# Patient Record
Sex: Male | Born: 1945 | Race: Black or African American | Hispanic: No | Marital: Married | Smoking: Never smoker
Health system: Southern US, Community
[De-identification: ages and names within clinical notes are randomized; demographics above are authoritative.]

## PROBLEM LIST (undated history)

## (undated) DIAGNOSIS — I4891 Unspecified atrial fibrillation: Secondary | ICD-10-CM

## (undated) DIAGNOSIS — S81802A Unspecified open wound, left lower leg, initial encounter: Secondary | ICD-10-CM

## (undated) DIAGNOSIS — G629 Polyneuropathy, unspecified: Secondary | ICD-10-CM

## (undated) DIAGNOSIS — D649 Anemia, unspecified: Secondary | ICD-10-CM

## (undated) DIAGNOSIS — E785 Hyperlipidemia, unspecified: Secondary | ICD-10-CM

## (undated) DIAGNOSIS — I10 Essential (primary) hypertension: Secondary | ICD-10-CM

## (undated) DIAGNOSIS — I509 Heart failure, unspecified: Secondary | ICD-10-CM

## (undated) DIAGNOSIS — I499 Cardiac arrhythmia, unspecified: Secondary | ICD-10-CM

## (undated) DIAGNOSIS — N189 Chronic kidney disease, unspecified: Secondary | ICD-10-CM

## (undated) HISTORY — PX: OTHER SURGICAL HISTORY: SHX169

## (undated) HISTORY — DX: Unspecified atrial fibrillation: I48.91

## (undated) HISTORY — PX: KIDNEY TRANSPLANT: SHX239

## (undated) HISTORY — DX: Polyneuropathy, unspecified: G62.9

## (undated) HISTORY — DX: Hyperlipidemia, unspecified: E78.5

## (undated) HISTORY — DX: Chronic kidney disease, unspecified: N18.9

## (undated) HISTORY — PX: ARTERIOVENOUS GRAFT PLACEMENT: SUR1029

---

## 1998-02-23 ENCOUNTER — Ambulatory Visit (HOSPITAL_COMMUNITY): Admission: RE | Admit: 1998-02-23 | Discharge: 1998-02-23 | Payer: Self-pay | Admitting: Vascular Surgery

## 1998-03-21 ENCOUNTER — Other Ambulatory Visit: Admission: RE | Admit: 1998-03-21 | Discharge: 1998-03-21 | Payer: Self-pay | Admitting: *Deleted

## 1998-04-03 ENCOUNTER — Ambulatory Visit (HOSPITAL_COMMUNITY): Admission: RE | Admit: 1998-04-03 | Discharge: 1998-04-03 | Payer: Self-pay | Admitting: Vascular Surgery

## 1998-04-15 ENCOUNTER — Ambulatory Visit (HOSPITAL_COMMUNITY): Admission: RE | Admit: 1998-04-15 | Discharge: 1998-04-15 | Payer: Self-pay | Admitting: Plastic Surgery

## 1998-05-30 ENCOUNTER — Ambulatory Visit (HOSPITAL_COMMUNITY): Admission: RE | Admit: 1998-05-30 | Discharge: 1998-05-30 | Payer: Self-pay | Admitting: Nephrology

## 1998-07-07 ENCOUNTER — Ambulatory Visit (HOSPITAL_COMMUNITY): Admission: RE | Admit: 1998-07-07 | Discharge: 1998-07-07 | Payer: Self-pay

## 1998-07-07 ENCOUNTER — Encounter: Payer: Self-pay | Admitting: Vascular Surgery

## 1999-02-26 ENCOUNTER — Ambulatory Visit (HOSPITAL_COMMUNITY): Admission: RE | Admit: 1999-02-26 | Discharge: 1999-02-26 | Payer: Self-pay | Admitting: Vascular Surgery

## 1999-10-08 ENCOUNTER — Ambulatory Visit (HOSPITAL_COMMUNITY): Admission: RE | Admit: 1999-10-08 | Discharge: 1999-10-08 | Payer: Self-pay | Admitting: Gastroenterology

## 2000-02-16 ENCOUNTER — Encounter: Payer: Self-pay | Admitting: Nephrology

## 2000-02-16 ENCOUNTER — Ambulatory Visit (HOSPITAL_COMMUNITY): Admission: RE | Admit: 2000-02-16 | Discharge: 2000-02-16 | Payer: Self-pay | Admitting: Nephrology

## 2000-06-09 ENCOUNTER — Encounter: Payer: Self-pay | Admitting: *Deleted

## 2000-06-09 ENCOUNTER — Ambulatory Visit (HOSPITAL_COMMUNITY): Admission: RE | Admit: 2000-06-09 | Discharge: 2000-06-09 | Payer: Self-pay | Admitting: *Deleted

## 2002-01-16 ENCOUNTER — Encounter: Payer: Self-pay | Admitting: Nephrology

## 2002-01-16 ENCOUNTER — Ambulatory Visit (HOSPITAL_COMMUNITY): Admission: RE | Admit: 2002-01-16 | Discharge: 2002-01-16 | Payer: Self-pay | Admitting: Nephrology

## 2002-09-10 ENCOUNTER — Inpatient Hospital Stay (HOSPITAL_COMMUNITY): Admission: AD | Admit: 2002-09-10 | Discharge: 2002-09-11 | Payer: Self-pay | Admitting: Nephrology

## 2002-09-10 ENCOUNTER — Encounter: Payer: Self-pay | Admitting: Nephrology

## 2003-01-07 ENCOUNTER — Encounter: Admission: RE | Admit: 2003-01-07 | Discharge: 2003-01-07 | Payer: Self-pay | Admitting: Nephrology

## 2003-01-07 ENCOUNTER — Encounter: Payer: Self-pay | Admitting: Nephrology

## 2003-11-26 ENCOUNTER — Ambulatory Visit (HOSPITAL_COMMUNITY): Admission: RE | Admit: 2003-11-26 | Discharge: 2003-11-26 | Payer: Self-pay | Admitting: Nephrology

## 2004-11-04 ENCOUNTER — Encounter: Admission: RE | Admit: 2004-11-04 | Discharge: 2005-02-02 | Payer: Self-pay | Admitting: Surgery

## 2005-08-19 ENCOUNTER — Encounter: Admission: RE | Admit: 2005-08-19 | Discharge: 2005-08-19 | Payer: Self-pay | Admitting: Nephrology

## 2005-08-25 ENCOUNTER — Encounter: Admission: RE | Admit: 2005-08-25 | Discharge: 2005-08-25 | Payer: Self-pay | Admitting: Nephrology

## 2007-07-28 ENCOUNTER — Encounter: Admission: RE | Admit: 2007-07-28 | Discharge: 2007-07-28 | Payer: Self-pay | Admitting: Nephrology

## 2009-08-15 ENCOUNTER — Emergency Department (HOSPITAL_COMMUNITY): Admission: EM | Admit: 2009-08-15 | Discharge: 2009-08-15 | Payer: Self-pay | Admitting: Emergency Medicine

## 2010-04-13 ENCOUNTER — Ambulatory Visit (HOSPITAL_COMMUNITY)
Admission: RE | Admit: 2010-04-13 | Discharge: 2010-04-13 | Payer: Self-pay | Source: Home / Self Care | Admitting: Nephrology

## 2010-09-29 ENCOUNTER — Encounter (HOSPITAL_COMMUNITY)
Admission: RE | Admit: 2010-09-29 | Discharge: 2010-10-20 | Payer: Self-pay | Source: Home / Self Care | Attending: Nephrology | Admitting: Nephrology

## 2011-02-05 NOTE — H&P (Signed)
[name], GRAVIER NO.:  [id number]   MEDICAL RECORD NO.:  [id number]                   PATIENT TYPE:  INP   LOCATION:  5504                                 FACILITY:  [facility]   PHYSICIAN:  Windy Kalata, M.D.          DATE OF BIRTH:  02-06-1946   DATE OF ADMISSION:  09/10/2002  DATE OF DISCHARGE:                                HISTORY & PHYSICAL   CHIEF COMPLAINT:  Fever and chills.   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  Yesterday, the patient  states he had two hypoglycemic episodes with seizure by report.  EMS came  to the home and the patient was given IV dextrose, and the patient felt that  he did not need to come to the hospital on either occasion.  The patient was  also febrile yesterday.  Today, he had another hypoglycemic episode pre-  dialysis, he did not take his Glucotrol, but had eating poorly with  associated seizure-like activity according to the nurse.  EMS was called  because the R.N.'s were concerned they could not get a line started in him.  However, they were able, and he was given 25 g of D50 per protocol.  Blood  sugar increased from the 50's into the 60's, and he was also given oral  glucose tablets and later given the rest of the 25 g of D50.   On admission, the patient has fevers, rigors, temperature increase from  102.8 to 103.3.  His blood pressure was elevated with a goal of approximate  4 L his goal was increased an additional 500 cc because he had been below  his dry weight.  Blood cultures x2 were drawn, and Ancef and tobramycin were  given empirically, and the patient will be admitted to [facility]  after  hemodialysis for further evaluation and treatment.  To be noted, is the  patient did receive the flu vaccine in the fall.   REVIEW OF SYMPTOMS:  Slight cough, no chest pain, no shortness of breath.  Positive urgency, but no urine output.  Positive anorexia, no nausea, no  vomiting, no diarrhea, no constipation, positive fever and rigors.  No  headache.  Prior sore throat and upper respiratory symptoms that moved from  his head into his chest, prior fatigue and sleepiness.   PAST MEDICAL HISTORY:  1. Subacute fifth left metatarsal fracture.  2. History of atrial flutter that was asymptomatic.  3. Early aseptic necrosis, right femoral head, Dr. Telford Nab.  4. Liver biopsy showed Statio hepatitis, this was part of his transplant     workup, in 12/98.  5.  History of gastrointestinal bleed.  6. History of internal and external hemorrhoids.  7. Diverticulosis.  8. Multiple AVM's in 10/97, with adenomatous polyp.  Followup colonoscopy in     1/01, was negative.  9. 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  10.      Sensory motor peripheral polyneuropathy in 8/96.  11.      Status post parathyroidectomy in 1/96.  12.      Multiple vascular access problems.   FAMILY HISTORY:  Noncontributory.   SOCIAL HISTORY:  Lives with wife.  [employer] part-time.   MEDICATIONS:  1. Nephro-Vite one tablet q.d.  2. Renagel four tablets a.c. with one with snacks.  3. P.r.n. Indocin for gout.  4. Glucotrol XL 10 mg q.a.m. 5 mg q.p.m.  5. Pepcid 20 mg q.d.  6. Coumadin 3 mg q.d. for graft patency.   PHYSICAL EXAMINATION AT HEMODIALYSIS TODAY:  VITAL SIGNS:  Temperature  103.3, blood pressure 183/74 after 3 L of fluid removed, pulse 110.  GENERAL:  An acutely-ill male, huddling under blankets with rigors.  HEENT:  Normocephalic, atraumatic.  Tongue coated.  Pupils equal, round,  reactive to light.  HEART:  Tachycardic.  LUNGS:  Decreased breath sounds, poor examination  quality.  ABDOMEN:  Positive bowel sounds, soft, liver down somewhat, difficult to  tell because of the way he is sitting in the chair.  Nontender,  nondistended.  EXTREMITIES:  No lower extremity edema, no skin breakdown.  Right upper AV  Gore-Tex graft is patent.  NEUROLOGIC:  Alert and oriented x3.  Moves all extremities well.   LABORATORY DATA:  Blood cultures x2 were drawn in hemodialysis.   ASSESSMENT AND PLAN:  1. Febrile illness.  Blood cultures x2 were drawn.  Empirically treated with     2 g of Ancef and 130 mg of tobramycin.  Will give Tequin at the hospital.     Check antibiotics for insulins or ANB.  Check CBC with diff, check urine     culture and chest x-ray.  2. Hypoglycemia with associated seizures.  Hold Glucotrol for now.  Give D10     slowly IV until blood sugars are greater then 150 consistently.  Resume     Glucotrol when sugars are increased and the patient is eating well.     Check Accu-Checks a.c. and h.s., and if sugars become ___________, we     will implement sliding scale insulin.  3. End-stage renal disease.  Monday, Wednesday, and Friday.  He is status     post hemodialysis today.  We will check fluid status on chest x-ray.  The     patient probably needs dry weight lowered due to hypertension.  4. Hypertension.  Continue volume removal, extra 500 cc were removed today.     We will weigh the patient on admission.  5. Status post parathyroidectomy in 1996.  Bio-PTH last October was 141, up     to 204 this month.  We will increase Calcitriol to 2 mcg.  6. Anemia management.  Hemoglobin was 12 on his monthly labs.  Continue low-     dose Epogen.  T-STAT was 32%.  7. History of elevated liver function tests.  His AST this month was 57.     This is a chronic problem.  The patient has a history of Statio     hepatitis.     Alric Seton, P.A.  Windy Kalata, M.D.   MB/MEDQ  D:  09/10/2002  T:  09/10/2002  Job:  [id number]

## 2011-02-05 NOTE — Op Note (Signed)
[place]  Patient:    [name], [name]                      MRN: [id number] Proc. Date: 06/09/00 Adm. Date:  [id number] Disc. Date: [id number] Attending:  Lavonna Monarch                           Operative Report  PREOPERATIVE DIAGNOSES: 1. End-stage renal failure. 2. Clotted right upper arm arteriovenous graft.  POSTOPERATIVE DIAGNOSES: 1. End-stage renal failure. 2. Clotted right upper arm arteriovenous graft.  PROCEDURE:  Thrombectomy and revision right upper arm arteriovenous graft.  SURGEON:  Gordy Clement, M.D.  ASSISTANT:  Ollen Bowl, P.A.  ANESTHESIA:  Local with MAC.  ANESTHESIOLOGIST:  Finis Bud, M.D.  OPERATIVE PROCEDURE:  The patient was brought to the operating room in stable condition.  Placed in a supine position.  Right arm prepped and draped in a sterile fashion.  The skin and subcutaneous tissue instilled with 1% Xylocaine with epinephrine. Longitudinal skin incision made through the right axilla.  Dissection carried down to expose the venous anastomosis.  There was a large, draining axillary vein.  This was mobilized proximally, controlled with a Gregory clamp.  The venous anastomosis taken down, a short segment of graft excised.  The graft thrombectomized with a 4 Fogarty catheter.  Excellent inflow obtained.  The graft was filled with heparin saline solution and controlled with a fistula clamp.  A new segment of 6 mm Gore-Tex was then anastomosed end-to-end to the divided graft using running 6-0 Prolene suture.  This was then anastomosed end-to-end to the vein using running 6-0 Prolene suture.  Clamps were then removed. Excellent flow present.  Good hemostasis obtained.  The sponge and instruments counts were correct.  Subcutaneous tissue closed with a running 3-0 Vicryl suture.  The skin closed with 4-0 Monocryl.  Half-inch Steri-Strips applied.  The patient transferred to the recovery room in  stable condition. DD:  06/09/00 TD:  06/10/00 Job: [id number]

## 2011-02-05 NOTE — Procedures (Signed)
[place]  Patient:    [name]                       MRN: [id number] Proc. Date: 10/08/99 Adm. Date:  [id number] Attending:  Sherrin Daisy CC:         Duane Lope. Mcarthur Rossetti, M.D.                           Procedure Report  PROCEDURE PERFORMED:  Colonoscopy.  ENDOSCOPIST:  Joyice Faster. Oletta Lamas, M.D.  MEDICATIONS USED:  Fentanyl 100 mcg, Versed 5 mg IV.  INSTRUMENT:  Adult Olympus video colonoscope.  INDICATIONS:  Previous history of adenomatous colon polyps removed in 1997 from a gentleman who is a renal transplant patient.  DESCRIPTION OF PROCEDURE:  The procedure had been explained to the patient and consent obtained.  With the patient in the left lateral decubitus position, the  video colonoscope was inserted and advanced under direct visualization.  The prep was somewhat marginal and there was sticky, adherent stool.  We were able to advance to the cecum without difficulty.  The scope was withdrawn.  The cecum, ascending colon, hepatic flexure, transverse colon, splenic flexure, descending and sigmoid colon were seen well.  Scattered diverticula in the sigmoid colon.  No polyps seen throughout.  Rectum carefully examined.  Large internal hemorrhoids. No other abnormalities.  Scope withdrawn, patient tolerated the procedure well.  Maintained on low flow oxygen and pulse oximeter throughout the procedure with o obvious problem.  ASSESSMENT: 1. Large internal hemorrhoids. 2. No evidence of further adenomatous colon polyps.  PLAN:  Will recommend repeating in three years. DD:  10/08/99 TD:  10/09/99 Job: [id number]

## 2011-02-05 NOTE — Discharge Summary (Signed)
NAMEBILLYE, HOPPA                         ACCOUNT NO.:  [id number]   MEDICAL RECORD NO.:  [id number]                   PATIENT TYPE:  INP   LOCATION:  5523                                 FACILITY:  [facility]   PHYSICIAN:  Windy Kalata, M.D.          DATE OF BIRTH:  [birth year]   DATE OF ADMISSION:  09/10/2002  DATE OF DISCHARGE:  09/11/2002                                 DISCHARGE SUMMARY   ADMISSION DIAGNOSIS:  Febrile illness.   DISCHARGE DIAGNOSIS:  Febrile illness.   SERVICE:  Renal service under Dr. Fleet Contras.   CONSULTATIONS:  None.   PROCEDURE:  None.   HISTORY AND PHYSICAL:  The patient is a 65 year old male with end-stage  renal disease secondary to hypertension with a prior history of renal  transplant rejection and prior failure peritoneal dialysis secondary to  peritonitis had been in his usual state of relatively good health until he  started having cold symptoms about 7-10 days before recent admission.  The  day before his admission the patient stated he had two hypoglycemic episodes  with a seizure by report.  EMS came to the home and the patient was given  IV dextrose, and the patient felt that he did not need to come to the  hospital on either occasion.  The patient was also febrile the day before  his admission.  The day of his admission he had another hypoglycemic  episode, predialysis, and he did not take his Glucotrol, but had eaten  poorly with associated seizure-like activity according to the nurse.  EMS  was called because the RNs were concerned that they could not get a line  started in him.  However, they were able and was given 25 grams of D-50 per  protocol.  Blood sugar increased from 50s into 60s and he was also given  oral glucose tablets and later given the rest of the 25 grams of D-50.  On  admission, the patient has fevers, rigors.  Temperature increased from 102.8  to 103.3, blood pressure was elevated with a goal of  approximately 4 liters.  His goal was increased an additional 500 cc because he had been below his  dry weight.  Blood cultures x2 were done and Ancef and Tobramycin were given  empirically and the patient was admitted to [facility] after hemodialysis for  further evaluation and treatment.  To be noted is the patient did not  receive the flu vaccine in the Fall.   LABORATORY DATA:  On the day of his admission, his sodium was 145, potassium  3.7, creatinine of 8.4, AST 289, ALT 62, bilirubin total 1.4.  White count  of 5.6, hemoglobin of 11.8.  Influenza A and B negative.   HOSPITAL COURSE:  The patient did well.  He was able to leave the next day  on the 23rd.  I do not believe he was ever dialyzed.  Blood cultures were  drawn at the ______ [place] were continued here at the  hospital.  The Glucotrol was cut in half from the usual dose of 10 mg in the  morning, 5 mg in the evening.  Blood pressure was controlled well.  Chest x-  ray after admission showed no definite pneumonia, loss of volume in right  lower lung field.  His T-max the night of December 22nd got up as high as  100.5, therefore it was felt important to continue Tequin.  For five more  days after discharge will continue Tequin.   CONDITION ON DISCHARGE:  Stable.   DISPOSITION:  Discharged to home.    DISCHARGE MEDICATIONS:  1. Tequin 200 mg p.o. q.d. x6 days.  2. Nephro-Vite one tablet p.o. q.h.s.  3. Renagel.  4. Indocin can be continued.  5. Coumadin 3 mg p.o. q.d.  6. Glucotrol XL will be stopped.  He understands to only restart Glucotrol     XL 5 mg one tablet once a day if blood sugars are greater than or equal     to 200.   FOLLOW UP:  We will check PT/INR next dialysis treatment.  He will resume  dialysis on the 24th on Wednesday at normal dialysis time.  That will be  tomorrow on the 24th.     Manus Gunning, P.A.-C                  Windy Kalata, M.D.    MG/MEDQ  D:   09/14/2002  T:  09/15/2002  Job:  [id number]

## 2011-06-30 ENCOUNTER — Inpatient Hospital Stay (HOSPITAL_COMMUNITY)
Admission: EM | Admit: 2011-06-30 | Discharge: 2011-07-02 | DRG: 253 | Disposition: A | Discharge status: Home / Self Care | Payer: Medicare Other | Attending: Vascular Surgery | Admitting: Vascular Surgery

## 2011-06-30 ENCOUNTER — Emergency Department (HOSPITAL_COMMUNITY)
Admission: EM | Admit: 2011-06-30 | Discharge: 2011-06-30 | Disposition: A | Discharge status: Home / Self Care | Payer: Medicare Other | Source: Home / Self Care | Attending: Emergency Medicine | Admitting: Emergency Medicine

## 2011-06-30 ENCOUNTER — Observation Stay (HOSPITAL_COMMUNITY): Payer: Medicare Other

## 2011-06-30 DIAGNOSIS — I739 Peripheral vascular disease, unspecified: Secondary | ICD-10-CM | POA: Diagnosis present

## 2011-06-30 DIAGNOSIS — T82898A Other specified complication of vascular prosthetic devices, implants and grafts, initial encounter: Secondary | ICD-10-CM

## 2011-06-30 DIAGNOSIS — I4891 Unspecified atrial fibrillation: Secondary | ICD-10-CM | POA: Diagnosis present

## 2011-06-30 DIAGNOSIS — Z940 Kidney transplant status: Secondary | ICD-10-CM

## 2011-06-30 DIAGNOSIS — I742 Embolism and thrombosis of arteries of the upper extremities: Secondary | ICD-10-CM

## 2011-06-30 DIAGNOSIS — E119 Type 2 diabetes mellitus without complications: Secondary | ICD-10-CM | POA: Diagnosis present

## 2011-06-30 DIAGNOSIS — Z79899 Other long term (current) drug therapy: Secondary | ICD-10-CM

## 2011-06-30 HISTORY — PX: LIGATION GORETEX GRAFT: SHX5154

## 2011-06-30 LAB — CBC
HCT: 34.2 % — ABNORMAL LOW (ref 39.0–52.0)
Hemoglobin: 11.2 g/dL — ABNORMAL LOW (ref 13.0–17.0)
MCH: 31.2 pg (ref 26.0–34.0)
MCV: 95.3 fL (ref 78.0–100.0)
Platelets: 117 10*3/uL — ABNORMAL LOW (ref 150–400)
RBC: 3.59 MIL/uL — ABNORMAL LOW (ref 4.22–5.81)
RDW: 12.4 % (ref 11.5–15.5)
WBC: 8.9 10*3/uL (ref 4.0–10.5)

## 2011-06-30 LAB — DIFFERENTIAL
Basophils Absolute: 0 10*3/uL (ref 0.0–0.1)
Basophils Relative: 0 % (ref 0–1)
Eosinophils Absolute: 0 10*3/uL (ref 0.0–0.7)
Eosinophils Relative: 0 % (ref 0–5)
Lymphocytes Relative: 6 % — ABNORMAL LOW (ref 12–46)
Lymphs Abs: 0.5 10*3/uL — ABNORMAL LOW (ref 0.7–4.0)
Monocytes Absolute: 0.4 10*3/uL (ref 0.1–1.0)
Monocytes Relative: 5 % (ref 3–12)
Neutro Abs: 8 10*3/uL — ABNORMAL HIGH (ref 1.7–7.7)
Neutrophils Relative %: 89 % — ABNORMAL HIGH (ref 43–77)

## 2011-06-30 LAB — BASIC METABOLIC PANEL
Calcium: 9.9 mg/dL (ref 8.4–10.5)
GFR calc Af Amer: 58 mL/min — ABNORMAL LOW (ref >90)
GFR calc non Af Amer: 50 mL/min — ABNORMAL LOW (ref >90)
Glucose, Bld: 332 mg/dL — ABNORMAL HIGH (ref 70–99)
Potassium: 4.1 mEq/L (ref 3.5–5.1)
Sodium: 133 mEq/L — ABNORMAL LOW (ref 135–145)

## 2011-06-30 LAB — GLUCOSE, CAPILLARY: Glucose-Capillary: 146 mg/dL — ABNORMAL HIGH (ref 70–99)

## 2011-07-01 LAB — BASIC METABOLIC PANEL
BUN: 16 mg/dL (ref 6–23)
Chloride: 102 mEq/L (ref 96–112)
Creatinine, Ser: 1.24 mg/dL (ref 0.50–1.35)
Glucose, Bld: 185 mg/dL — ABNORMAL HIGH (ref 70–99)
Potassium: 4.3 mEq/L (ref 3.5–5.1)

## 2011-07-01 LAB — HEMOGLOBIN A1C
Hgb A1c MFr Bld: 8.8 % — ABNORMAL HIGH (ref <5.7)
Mean Plasma Glucose: 206 mg/dL — ABNORMAL HIGH (ref <117)

## 2011-07-01 LAB — CBC
HCT: 34.9 % — ABNORMAL LOW (ref 39.0–52.0)
Hemoglobin: 11.3 g/dL — ABNORMAL LOW (ref 13.0–17.0)
MCV: 94.6 fL (ref 78.0–100.0)
RDW: 12.5 % (ref 11.5–15.5)
WBC: 10 10*3/uL (ref 4.0–10.5)

## 2011-07-01 LAB — GLUCOSE, CAPILLARY
Glucose-Capillary: 301 mg/dL — ABNORMAL HIGH (ref 70–99)
Glucose-Capillary: 213 mg/dL — ABNORMAL HIGH (ref 70–99)

## 2011-07-01 LAB — HEPARIN LEVEL (UNFRACTIONATED): Heparin Unfractionated: <0.1 IU/mL — ABNORMAL LOW (ref 0.30–0.70)

## 2011-07-02 LAB — CBC
HCT: 31.9 % — ABNORMAL LOW (ref 39.0–52.0)
Hemoglobin: 10.5 g/dL — ABNORMAL LOW (ref 13.0–17.0)
MCH: 30.9 pg (ref 26.0–34.0)
MCHC: 32.9 g/dL (ref 30.0–36.0)
MCV: 93.8 fL (ref 78.0–100.0)
RDW: 12.3 % (ref 11.5–15.5)

## 2011-07-02 LAB — GLUCOSE, CAPILLARY: Glucose-Capillary: 287 mg/dL — ABNORMAL HIGH (ref 70–99)

## 2011-07-06 NOTE — Discharge Summary (Addendum)
NAMEMONTEZ, NEUDECKER NO.:  [id number]  MEDICAL RECORD NO.:  [id number]  LOCATION:  2023                         FACILITY:  [facility]  PHYSICIAN:  Nelda Severe. Kellie Simmering, M.D.  DATE OF BIRTH:  1946/04/05  DATE OF ADMISSION:  06/30/2011 DATE OF DISCHARGE:  07/02/2011                              DISCHARGE SUMMARY   CHIEF COMPLAINT:  Pain in the right hand.  HISTORY OF PRESENT ILLNESS:  [name] is a 65 year old gentleman with a history of chronic kidney disease, and right upper arm functioning AV graft, who is not presently on dialysis, as he has a functioning kidney transplant which he received in 2005.  On June 30, 2011, he was taking a lawnmower at the back of his car when he noticed the onset of acute right hand pain.  He came to the emergency department but they were unable to Doppler pulses in his hand.  He had not had any previous issues with the right upper extremity.  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  The patient was seen by Dr. Kellie Simmering, and it was felt that he should have an emergent exploration of brachial artery with thromboembolectomy of the radial and ulnar arteries.  PAST MEDICAL HISTORY: 1. Neuropathy. 2. Atrial fibrillation, not on Coumadin. 3. Hyperlipidemia. 4. Diabetes type 2. 5. Chronic kidney disease, status post transplant in 2005. 6. Achilles tendon repair on the left. 7. Bowel repair status post transplant procedure.  HOSPITAL COURSE:  The patient was taken emergently to the operating room for ligation of his arterial graft in the right upper arm.  He also had exploration of the brachial artery with selective thromboembolectomy of radial and ulnar arteries with very small amount of debris removed.  He was noted to have severe calcific vessels with intraoperative arteriogram.  Postoperatively was found to have a good pulse in the brachial artery with  obstructive sounding of the Doppler flow.  There is no audible flow in the radial and ulnar arteries on that side.  However, there is no other option to proceed, so patient was closed and was started on IV heparin.  He had did well over the next couple of days with improvement in his pain status he also had a good grip in the right hand.  The right hand was cyanotic and cool, but the patient stated that the right hand overall felt much better.  He did have Doppler signal only in the ulnar artery which was monophasic at best he had a had biphasic on the brachial signal in the right arm but no palmar arch signal.  It was felt that at this point, the patient's condition was stable as his symptoms had improved.  There is no other further surgical options because of the calcific nature of his radial and ulnar arteries if things were to worsen.  The patient realizes that he may lose his hand and part of his arm.  His heparin was discontinued.  He was started on aspirin and he was discharged to home to be followed by Dr. Kellie Simmering in 3 weeks.  FINAL DIAGNOSES: 1. 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  He is status     post ligation of functioning AV graft in the right upper extremity     and exploration and thromboembolectomy of the radial and ulnar     arteries of the right arm. 2. His other medical conditions were stable while he was in the     hospital.  His CBC is white count was 6.3 hemoglobin hematocrit     were 10.5 and 31.9.  BMET, his potassium is 4.3, and his creatinine     is 1.24.  DISPOSITION:  Patient discharged home.  He will follow up with Dr. Kellie Simmering in 3 weeks.  DISCHARGE MEDICATIONS: 1. Oxycodone 5 mg 1-2 tabs every 4 hours as needed for pain. 2. Benadryl 25 mg 4 at bedtime as needed for sleep and allergies. 3. Calcitriol 0.25 mcg every Monday Wednesday, Friday. 4. CellCept 500 mg twice  daily. 5. Furosemide 40 mg every other day. 6. Gabapentin 200 mg daily at bedtime. 7. Glipizide 10 mg twice daily before meals. 8. Lovaza 4 capsules every morning, 1 mg. 9. Prednisone 5 mg daily. 10.Prograf 1 mg twice daily. 11.Trajenta 5 mg daily.     Michael Kearns, PA-C   ______________________________ Nelda Severe Kellie Simmering, M.D.    RR/MEDQ  D:  07/02/2011  T:  07/02/2011  Job:  [id number]  Electronically Signed by Michael Kearns PA on 07/06/2011 09:46:09 AM Electronically Signed by Tinnie Gens M.D. on 07/12/2011 10:07:55 AM

## 2011-07-12 NOTE — Consult Note (Signed)
NAMEDEVLIN, [name] NO.:  [id number]  MEDICAL RECORD NO.:  [id number]  LOCATION:  WLED                         FACILITY:  [name]  PHYSICIAN:  Nelda Severe. Kellie Simmering, M.D.  DATE OF BIRTH:  11/27/1945  DATE OF CONSULTATION: DATE OF DISCHARGE:  06/30/2011                                CONSULTATION   CONSULTING PHYSICIAN:  Nelda Severe. Kellie Simmering, M.D.  HISTORY OF PRESENT ILLNESS:  The patient presented today in the Emergency Room at [facility] with complaints of right hand pain.  He states that this morning he bought lawnmower and was trying to remove it from his trunk when he noticed acute onset of right hand pain.  He states he thought he sprained it and therefore he presented to the emergency room. On evaluation in the ER at [facility], they were unable to Doppler pulses in his hand, therefore vascular surgeon was consulted.  The patient was transferred to [facility] ER where he is currently being evaluated.  The patient denies any episodes of hand pain prior to this. His past medical history is significant for chronic kidney disease and has had multiple access procedures performed in the past.  He has an old nonfunctioning right forearm graft and a functioning right upper arm graft that has two pseudoaneurysms noted.  The patient states that he had a kidney transplant back in 2005 and has not been on dialysis since that time.  His nephrologist is Dr. Marval Regal and he was recently evaluated and was found to be doing quite well. The patient states that his hand has progressively increased in pain and he has noticed some numbness in his fingers.  He denies chest pain, shortness of breath, nausea, vomiting, diarrhea, constipation, TIA or CVA symptoms, and claudication symptoms.  Other than right hand pain, the patient is without complaint.  PAST MEDICAL HISTORY: 1. Neuropathy. 2. AFib, not on Coumadin. 3. Hyperlipidemia. 4. Diabetes mellitus type 2. 5. Chronic kidney disease,  status post transplant in 2005, status post     history of multiple access procedures in the right arm and a failed     left forearm AV fistula in the past. 6. Achilles tendon repair on the left. 7. Bowel repair, status post transplant procedure.  PAST SURGICAL HISTORY: 1. Kidney transplant in 2005. 2. Multiple access procedures in bilateral arms. 3. Achilles tendon repair on the left. 4. Bowel repair.  ALLERGIES:  ELAVIL, patient states it makes him "crazy".  MEDICATIONS: 1. Lovaza daily. 2. Prograf 1 mg b.i.d. 3. Prednisone 5 mg daily. 4. CellCept 500 mg b.i.d. 5. Tradjenta 5 mg daily. 6. Glipizide 10 mg b.i.d. 7. Gabapentin p.r.n. per patient report. 8. Calcium OTC 3 times per week.  SOCIAL HISTORY:  The patient denies tobacco and alcohol use.  He also denies illicit drug use.  REVIEW OF SYSTEMS:  Complete review of systems is negative except as stated in the HPI.  PHYSICAL EXAMINATION:  VITAL SIGNS:  Temp 98.3, BP 167/95, O2 sat 96% on room air. GENERAL:  He is in no acute distress and resting comfortably on the bed. HEENT:  Normocephalic, atraumatic.  PERRLA. EOMI. CARDIAC:  Irregular.  No murmurs noted. LUNGS:  Clear to  auscultation. ABDOMEN:  Soft, nontender with active bowel sounds. MUSCULOSKELETAL:  There is a 2+ palpable radial pulse on the left, and hand is warm and pink.  There are no palpable pulses in the right radial or ulnar side.  There is a palpable brachial pulse on the right and a palpable graft thrill in the right upper arm graft.  The right hand has some pallor and bluish discoloration of the nail beds.  Cap refill is slow.  There is no evidence of wounds.  There is an ulnar signal with a Doppler on the right, which is diminished.  There is no radial Doppler signal on the right. NEURO:  Intact.  Motor and sensation are intact in all 4 extremities. SKIN:  No evidence of rashes or skin breakdown are noted.  LABS:  CBC and BMP performed on June 30, 2011, white count 8.9, hemoglobin 11.2, hematocrit 34.2, platelets 117.  Sodium 133, potassium 4.1, BUN 22, creatinine 1.44.  ASSESSMENT:  Acute ischemia of the right hand.  Possible thrombus.  PLAN:  The patient will be taken urgently to the operating room for ligation of his right upper arm graft and exploration of the brachial artery and possible thrombectomy.     Leta Baptist, PA   ______________________________ Nelda Severe Kellie Simmering, M.D.    AY/MEDQ  D:  06/30/2011  T:  06/30/2011  Job:  [id number]  Electronically Signed by Leta Baptist PA on 07/09/2011 09:59:57 AM Electronically Signed by Tinnie Gens M.D. on 07/12/2011 10:08:00 AM

## 2011-07-12 NOTE — Op Note (Signed)
NAMETRAE, HORWITZ NO.:  [id number]  MEDICAL RECORD NO.:  [id number]  LOCATION:  [id number]                         FACILITY:  [facility]  PHYSICIAN:  Nelda Severe. Kellie Simmering, M.D.  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  OPERATIONS: 1. Ligation, arteriovenous graft, right upper arm. 2. Exploration of brachial artery with selective thromboembolectomy of     radial and ulnar arteries with very small amount of debris removed,     severe calcific vessels with intraoperative arteriogram.  SURGEON:  Nelda Severe. Kellie Simmering, MD  FIRST ASSISTANT:  Evorn Gong, [place]  ANESTHESIA:  General endotracheal.  PROCEDURE:  The patient was taken the operating room and placed in a supine position at which time satisfactory general endotracheal anesthesia was administered.  The right upper extremity was prepped with Betadine scrub and solution, draped in a routine sterile manner.  The hand was prepped in a sterile intestinal bag so that we could observe the color of the fingers.  The functioning AV graft in the left upper arm was originating from the brachial artery in the distal upper arm.  A short longitudinal incision was made in this area.  There was a large pseudoaneurysm about 4 cm from the arterial anastomosis which was about 4 x 4 cm in size ad second pseudoaneurysm more proximally in the graft. Graft was dissected free.  Brachial, proximal, and distal control of the brachial artery was obtained.  4000 units of heparin was given intravenously.  Graft was transected leaving about a 1-cm cuff on the brachial artery and the proximal end of the  graft ligated with #1 silk tie.  Following this, there was excellent inflow noted.  The #3 and #4 Fogarty catheter was passed distally to see if any debris would be located in the radial and ulnar arteries.  The catheters would not go more than 25-30 mm to about the mid to distal forearm and there was severe calcific disease in the vessels.  Excellent backbleeding was present.  After multiple passes, the cuff of graft was oversewn with 2 continuous 6-0 Prolene suture.  There was abnormal arterial flow at the brachial artery level.  There was no flow in the radial or ulnar level at the wrist.  Intraoperative arteriogram was performed injecting 30 mL of contrast into the brachial artery.  This revealed severe disease in both the radial and ulnar branches with occlusion and tapering at about the midforearm level.  Decided to explore each vessel selectively to be certain there was nothing further that could be done, therefore second incision was made in the proximal forearm where the brachial artery bifurcated.  There was an old Gore-Tex graft which was nonfunctional originating from brachial artery in this area.  The brachial artery was exposed down to the radial and ulnar branches which were encircled with vessel loops.  Transverse opening was made in the artery and there was  excellent inflow present.  The #3 Fogarty was passed selectively down the radial and ulnar arteries and as before Fogarty would not go past the distal forearm in the ulnar, in the wrist, in the radial and it was very severely calcified and diseased over the mid to distal half of the forearm and both vessels.  The arteriotomy was repaired with a continuous 6-0 Prolene suture. The clamp was released and there was a good pulse in the brachial artery with obstructive sounding of Doppler flow.  There was no flow audible on the radial or ulnar arteries as preoperatively.  Having no further options, the wounds were  irrigated with saline.  There was another incision made in the proximal upper arm to transect the graft in order to evacuate these large pseudoaneurysms and the proximal end of the graft was ligated with a #1 silk tie.  All wounds were closed in layers with Vicryl in a subcuticular fashion with Dermabond.  Sterile compression dressing was applied and the patient was taken to the recovery room in stable condition.     Nelda Severe Kellie Simmering, M.D.     JDL/MEDQ  D:  06/30/2011  T:  07/01/2011  Job:  [id number]  Electronically Signed by Tinnie Gens M.D. on 07/12/2011 10:07:58 AM

## 2011-07-14 ENCOUNTER — Encounter: Payer: Self-pay | Admitting: Vascular Surgery

## 2011-07-16 ENCOUNTER — Ambulatory Visit (INDEPENDENT_AMBULATORY_CARE_PROVIDER_SITE_OTHER): Payer: Medicare Other | Admitting: Vascular Surgery

## 2011-07-16 ENCOUNTER — Encounter: Payer: Self-pay | Admitting: Vascular Surgery

## 2011-07-16 VITALS — BP 159/77 | HR 86 | Resp 20 | Ht 68.0 in | Wt 166.9 lb

## 2011-07-16 DIAGNOSIS — L03019 Cellulitis of unspecified finger: Secondary | ICD-10-CM

## 2011-07-16 DIAGNOSIS — IMO0001 Reserved for inherently not codable concepts without codable children: Secondary | ICD-10-CM | POA: Insufficient documentation

## 2011-07-16 DIAGNOSIS — N186 End stage renal disease: Secondary | ICD-10-CM

## 2011-07-16 MED ORDER — SULFAMETHOXAZOLE-TRIMETHOPRIM 800-160 MG PO TABS: 1.0000 | ORAL_TABLET | Freq: Two times a day (BID) | ORAL | Status: DC

## 2011-07-16 MED ORDER — SULFAMETHOXAZOLE-TRIMETHOPRIM 800-160 MG PO TABS: 1.0000 | ORAL_TABLET | Freq: Two times a day (BID) | ORAL | Status: AC

## 2011-07-16 NOTE — Progress Notes (Signed)
[facility]  Postoperative Visit  History of Present Illness  [name] is a 65 y.o. year old male who presents for postoperative follow-up for: ligation of RUA AVG, TE of radial and ulnar arteries by Dr. Kellie Simmering  (Date: 06/30/11).  The patient's wounds are healed.  The patient notes improvement resolution of lower extremity symptoms.  The patient is able to complete their activities of daily living.  The patient's current symptoms are: pain in right 3rd finger.  Patient has h/o recurrent paronychia treated with abx.  The patient is a transplant patient on immunosuppression regimen.  Physical Examination  Filed Vitals:   07/16/11 1227  BP: 159/77  Pulse: 86  Resp: 20   RUE: Incisions are healed, 3rd finger has erythema surrounding the nail without frank purulence, TTP to this area, somewhat ischemic 2nd fingertip, with limited ischemic changes to 3rd and 4th fingertips  Medical Decision Making  [name] is a 65 y.o. year old male who presents s/p ligation of RUA AVG, TE of radial and ulnar arteries with likely early paronychia of right 3rd finger The right hand looks surprisingly well perfused given his named arteries in his right lower arm are occluded I gave the patient a 10 day course of Bactrim DS 1 PO BID to help treat his paronychia I discussed with the patient if his right hand worsened, he might need a I&D of his 3rd finger, which we was reluctant to proceed with with I discussed with him that he needs to contact his transplant team to get guidance and evaluation from them in the setting of new infection The patient has follow up with Dr. Kellie Simmering already scheduled, and should plan on following up with him at that time. I discussed in depth with the patient the nature of atherosclerosis, and emphasized the importance of maximal medical management including strict control of blood pressure, blood glucose, and lipid levels, obtaining regular  exercise, and cessation of smoking.  The patient is aware that without maximal medical management the underlying atherosclerotic disease process will progress, limiting the benefit of any interventions.  Thank you for allowing [place] to participate in this patient's care.  [name], MD [facility] Office: [phone] Pager: [phone]

## 2011-07-26 ENCOUNTER — Encounter: Payer: Self-pay | Admitting: Vascular Surgery

## 2011-07-27 ENCOUNTER — Encounter: Payer: Self-pay | Admitting: Vascular Surgery

## 2011-07-27 ENCOUNTER — Ambulatory Visit (INDEPENDENT_AMBULATORY_CARE_PROVIDER_SITE_OTHER): Payer: Medicare Other | Admitting: Vascular Surgery

## 2011-07-27 VITALS — BP 133/80 | HR 83 | Temp 98.4°F | Ht 68.0 in | Wt 166.0 lb

## 2011-07-27 DIAGNOSIS — N186 End stage renal disease: Secondary | ICD-10-CM

## 2011-07-27 DIAGNOSIS — I70209 Unspecified atherosclerosis of native arteries of extremities, unspecified extremity: Secondary | ICD-10-CM

## 2011-07-27 NOTE — Progress Notes (Signed)
Subjective:     Patient ID: [name], male   DOB: 1945/09/21, 65 y.o.   MRN: [id number]  HPI this 65 year old male patient had ligation of a right upper arm AV graft performed by me on 06/30/2011 for an ischemic right hand. He was found to have severe radial and ulnar occlusive disease at the time of surgery which was non-reconstructable. Since that time he states the right hand has felt better. It occasionally causes numbness in the third finger. He has had no nonhealing ulcers. He doesn't have total numbness of the hand. It does not keep him awake at night. He takes mild analgesics such as Tylenol.  Review of Systems     Objective:   Physical Exam blood pressure 159/77 heart rate 86 respirations 14 Right upper extremity exam reveals a well-healed antecubital wound. The upper arm AV graft is thrombosed with a large pseudoaneurysm measuring 3 x 2 cm with old thrombus but no pulsation as the graft has been ligated. No radial or or pulses are palpable. He does have a warm right hand with good strength and intact sensation for the most part.     Assessment:    severe radial and ulnar occlusive disease----not candidate for revascularization    Plan:     Will return to see [place] on when necessary basis. If he develops further problems with pain in right hand for ischemic ulcers will need to see hand surgeon.

## 2011-10-11 DIAGNOSIS — Z940 Kidney transplant status: Secondary | ICD-10-CM | POA: Diagnosis not present

## 2011-10-11 DIAGNOSIS — I10 Essential (primary) hypertension: Secondary | ICD-10-CM | POA: Diagnosis not present

## 2011-10-11 DIAGNOSIS — D509 Iron deficiency anemia, unspecified: Secondary | ICD-10-CM | POA: Diagnosis not present

## 2011-10-11 DIAGNOSIS — T8690 Unspecified complication of unspecified transplanted organ and tissue: Secondary | ICD-10-CM | POA: Diagnosis not present

## 2011-10-11 DIAGNOSIS — E1129 Type 2 diabetes mellitus with other diabetic kidney complication: Secondary | ICD-10-CM | POA: Diagnosis not present

## 2011-10-11 DIAGNOSIS — E039 Hypothyroidism, unspecified: Secondary | ICD-10-CM | POA: Diagnosis not present

## 2011-10-12 DIAGNOSIS — Z940 Kidney transplant status: Secondary | ICD-10-CM | POA: Diagnosis not present

## 2011-10-12 DIAGNOSIS — D509 Iron deficiency anemia, unspecified: Secondary | ICD-10-CM | POA: Diagnosis not present

## 2011-10-12 DIAGNOSIS — I10 Essential (primary) hypertension: Secondary | ICD-10-CM | POA: Diagnosis not present

## 2011-10-12 DIAGNOSIS — T8610 Unspecified complication of kidney transplant: Secondary | ICD-10-CM | POA: Diagnosis not present

## 2011-11-29 DIAGNOSIS — E291 Testicular hypofunction: Secondary | ICD-10-CM | POA: Diagnosis not present

## 2011-11-29 DIAGNOSIS — N400 Enlarged prostate without lower urinary tract symptoms: Secondary | ICD-10-CM | POA: Diagnosis not present

## 2011-11-29 DIAGNOSIS — N529 Male erectile dysfunction, unspecified: Secondary | ICD-10-CM | POA: Diagnosis not present

## 2012-01-07 DIAGNOSIS — E785 Hyperlipidemia, unspecified: Secondary | ICD-10-CM | POA: Diagnosis not present

## 2012-01-07 DIAGNOSIS — E1129 Type 2 diabetes mellitus with other diabetic kidney complication: Secondary | ICD-10-CM | POA: Diagnosis not present

## 2012-01-07 DIAGNOSIS — Z940 Kidney transplant status: Secondary | ICD-10-CM | POA: Diagnosis not present

## 2012-01-07 DIAGNOSIS — T8690 Unspecified complication of unspecified transplanted organ and tissue: Secondary | ICD-10-CM | POA: Diagnosis not present

## 2012-01-07 DIAGNOSIS — I10 Essential (primary) hypertension: Secondary | ICD-10-CM | POA: Diagnosis not present

## 2012-01-07 DIAGNOSIS — D509 Iron deficiency anemia, unspecified: Secondary | ICD-10-CM | POA: Diagnosis not present

## 2012-01-10 DIAGNOSIS — E119 Type 2 diabetes mellitus without complications: Secondary | ICD-10-CM | POA: Diagnosis not present

## 2012-01-10 DIAGNOSIS — N183 Chronic kidney disease, stage 3 unspecified: Secondary | ICD-10-CM | POA: Diagnosis not present

## 2012-01-10 DIAGNOSIS — D509 Iron deficiency anemia, unspecified: Secondary | ICD-10-CM | POA: Diagnosis not present

## 2012-01-10 DIAGNOSIS — I129 Hypertensive chronic kidney disease with stage 1 through stage 4 chronic kidney disease, or unspecified chronic kidney disease: Secondary | ICD-10-CM | POA: Diagnosis not present

## 2012-02-25 DIAGNOSIS — H40019 Open angle with borderline findings, low risk, unspecified eye: Secondary | ICD-10-CM | POA: Diagnosis not present

## 2012-02-25 DIAGNOSIS — E119 Type 2 diabetes mellitus without complications: Secondary | ICD-10-CM | POA: Diagnosis not present

## 2012-04-27 DIAGNOSIS — IMO0001 Reserved for inherently not codable concepts without codable children: Secondary | ICD-10-CM | POA: Diagnosis not present

## 2012-04-27 DIAGNOSIS — E7800 Pure hypercholesterolemia, unspecified: Secondary | ICD-10-CM | POA: Diagnosis not present

## 2012-04-27 DIAGNOSIS — G609 Hereditary and idiopathic neuropathy, unspecified: Secondary | ICD-10-CM | POA: Diagnosis not present

## 2012-04-27 DIAGNOSIS — I10 Essential (primary) hypertension: Secondary | ICD-10-CM | POA: Diagnosis not present

## 2012-05-09 DIAGNOSIS — E785 Hyperlipidemia, unspecified: Secondary | ICD-10-CM | POA: Diagnosis not present

## 2012-05-09 DIAGNOSIS — D509 Iron deficiency anemia, unspecified: Secondary | ICD-10-CM | POA: Diagnosis not present

## 2012-05-09 DIAGNOSIS — I10 Essential (primary) hypertension: Secondary | ICD-10-CM | POA: Diagnosis not present

## 2012-05-09 DIAGNOSIS — T8690 Unspecified complication of unspecified transplanted organ and tissue: Secondary | ICD-10-CM | POA: Diagnosis not present

## 2012-05-09 DIAGNOSIS — E1129 Type 2 diabetes mellitus with other diabetic kidney complication: Secondary | ICD-10-CM | POA: Diagnosis not present

## 2012-05-09 DIAGNOSIS — Z940 Kidney transplant status: Secondary | ICD-10-CM | POA: Diagnosis not present

## 2012-05-10 DIAGNOSIS — Z940 Kidney transplant status: Secondary | ICD-10-CM | POA: Diagnosis not present

## 2012-05-10 DIAGNOSIS — D509 Iron deficiency anemia, unspecified: Secondary | ICD-10-CM | POA: Diagnosis not present

## 2012-05-10 DIAGNOSIS — T8610 Unspecified complication of kidney transplant: Secondary | ICD-10-CM | POA: Diagnosis not present

## 2012-05-10 DIAGNOSIS — N183 Chronic kidney disease, stage 3 unspecified: Secondary | ICD-10-CM | POA: Diagnosis not present

## 2012-08-02 DIAGNOSIS — G609 Hereditary and idiopathic neuropathy, unspecified: Secondary | ICD-10-CM | POA: Diagnosis not present

## 2012-08-02 DIAGNOSIS — E7800 Pure hypercholesterolemia, unspecified: Secondary | ICD-10-CM | POA: Diagnosis not present

## 2012-08-02 DIAGNOSIS — IMO0001 Reserved for inherently not codable concepts without codable children: Secondary | ICD-10-CM | POA: Diagnosis not present

## 2012-08-02 DIAGNOSIS — I10 Essential (primary) hypertension: Secondary | ICD-10-CM | POA: Diagnosis not present

## 2012-09-07 DIAGNOSIS — I10 Essential (primary) hypertension: Secondary | ICD-10-CM | POA: Diagnosis not present

## 2012-09-07 DIAGNOSIS — D509 Iron deficiency anemia, unspecified: Secondary | ICD-10-CM | POA: Diagnosis not present

## 2012-09-07 DIAGNOSIS — Z940 Kidney transplant status: Secondary | ICD-10-CM | POA: Diagnosis not present

## 2012-09-07 DIAGNOSIS — E1129 Type 2 diabetes mellitus with other diabetic kidney complication: Secondary | ICD-10-CM | POA: Diagnosis not present

## 2012-09-07 DIAGNOSIS — E785 Hyperlipidemia, unspecified: Secondary | ICD-10-CM | POA: Diagnosis not present

## 2012-09-07 DIAGNOSIS — T8690 Unspecified complication of unspecified transplanted organ and tissue: Secondary | ICD-10-CM | POA: Diagnosis not present

## 2012-09-08 DIAGNOSIS — Z23 Encounter for immunization: Secondary | ICD-10-CM | POA: Diagnosis not present

## 2012-09-08 DIAGNOSIS — Z940 Kidney transplant status: Secondary | ICD-10-CM | POA: Diagnosis not present

## 2012-09-08 DIAGNOSIS — N183 Chronic kidney disease, stage 3 unspecified: Secondary | ICD-10-CM | POA: Diagnosis not present

## 2012-09-08 DIAGNOSIS — E119 Type 2 diabetes mellitus without complications: Secondary | ICD-10-CM | POA: Diagnosis not present

## 2012-09-08 DIAGNOSIS — I10 Essential (primary) hypertension: Secondary | ICD-10-CM | POA: Diagnosis not present

## 2012-09-08 DIAGNOSIS — T8610 Unspecified complication of kidney transplant: Secondary | ICD-10-CM | POA: Diagnosis not present

## 2012-09-08 DIAGNOSIS — Z125 Encounter for screening for malignant neoplasm of prostate: Secondary | ICD-10-CM | POA: Diagnosis not present

## 2012-09-22 DIAGNOSIS — Z940 Kidney transplant status: Secondary | ICD-10-CM | POA: Diagnosis not present

## 2012-09-25 ENCOUNTER — Encounter: Payer: Self-pay | Admitting: Nephrology

## 2012-12-14 DIAGNOSIS — N400 Enlarged prostate without lower urinary tract symptoms: Secondary | ICD-10-CM | POA: Diagnosis not present

## 2012-12-21 DIAGNOSIS — E291 Testicular hypofunction: Secondary | ICD-10-CM | POA: Diagnosis not present

## 2012-12-21 DIAGNOSIS — N529 Male erectile dysfunction, unspecified: Secondary | ICD-10-CM | POA: Diagnosis not present

## 2012-12-21 DIAGNOSIS — N400 Enlarged prostate without lower urinary tract symptoms: Secondary | ICD-10-CM | POA: Diagnosis not present

## 2012-12-28 DIAGNOSIS — I10 Essential (primary) hypertension: Secondary | ICD-10-CM | POA: Diagnosis not present

## 2012-12-28 DIAGNOSIS — Z940 Kidney transplant status: Secondary | ICD-10-CM | POA: Diagnosis not present

## 2012-12-28 DIAGNOSIS — E1129 Type 2 diabetes mellitus with other diabetic kidney complication: Secondary | ICD-10-CM | POA: Diagnosis not present

## 2012-12-28 DIAGNOSIS — T8690 Unspecified complication of unspecified transplanted organ and tissue: Secondary | ICD-10-CM | POA: Diagnosis not present

## 2012-12-28 DIAGNOSIS — D509 Iron deficiency anemia, unspecified: Secondary | ICD-10-CM | POA: Diagnosis not present

## 2012-12-28 DIAGNOSIS — E785 Hyperlipidemia, unspecified: Secondary | ICD-10-CM | POA: Diagnosis not present

## 2012-12-29 DIAGNOSIS — Z940 Kidney transplant status: Secondary | ICD-10-CM | POA: Diagnosis not present

## 2012-12-29 DIAGNOSIS — I10 Essential (primary) hypertension: Secondary | ICD-10-CM | POA: Diagnosis not present

## 2012-12-29 DIAGNOSIS — T8610 Unspecified complication of kidney transplant: Secondary | ICD-10-CM | POA: Diagnosis not present

## 2012-12-29 DIAGNOSIS — N183 Chronic kidney disease, stage 3 unspecified: Secondary | ICD-10-CM | POA: Diagnosis not present

## 2013-03-01 DIAGNOSIS — E119 Type 2 diabetes mellitus without complications: Secondary | ICD-10-CM | POA: Diagnosis not present

## 2013-03-01 DIAGNOSIS — H2510 Age-related nuclear cataract, unspecified eye: Secondary | ICD-10-CM | POA: Diagnosis not present

## 2013-03-01 DIAGNOSIS — H4011X0 Primary open-angle glaucoma, stage unspecified: Secondary | ICD-10-CM | POA: Diagnosis not present

## 2013-03-01 DIAGNOSIS — E11319 Type 2 diabetes mellitus with unspecified diabetic retinopathy without macular edema: Secondary | ICD-10-CM | POA: Diagnosis not present

## 2013-03-15 DIAGNOSIS — D485 Neoplasm of uncertain behavior of skin: Secondary | ICD-10-CM | POA: Diagnosis not present

## 2013-03-15 DIAGNOSIS — D2310 Other benign neoplasm of skin of unspecified eyelid, including canthus: Secondary | ICD-10-CM | POA: Diagnosis not present

## 2013-06-05 DIAGNOSIS — E785 Hyperlipidemia, unspecified: Secondary | ICD-10-CM | POA: Diagnosis not present

## 2013-06-05 DIAGNOSIS — Z940 Kidney transplant status: Secondary | ICD-10-CM | POA: Diagnosis not present

## 2013-06-05 DIAGNOSIS — I10 Essential (primary) hypertension: Secondary | ICD-10-CM | POA: Diagnosis not present

## 2013-06-05 DIAGNOSIS — T8690 Unspecified complication of unspecified transplanted organ and tissue: Secondary | ICD-10-CM | POA: Diagnosis not present

## 2013-06-05 DIAGNOSIS — D509 Iron deficiency anemia, unspecified: Secondary | ICD-10-CM | POA: Diagnosis not present

## 2013-06-05 DIAGNOSIS — E1129 Type 2 diabetes mellitus with other diabetic kidney complication: Secondary | ICD-10-CM | POA: Diagnosis not present

## 2013-06-06 DIAGNOSIS — N2581 Secondary hyperparathyroidism of renal origin: Secondary | ICD-10-CM | POA: Diagnosis not present

## 2013-06-06 DIAGNOSIS — Z940 Kidney transplant status: Secondary | ICD-10-CM | POA: Diagnosis not present

## 2013-06-06 DIAGNOSIS — I10 Essential (primary) hypertension: Secondary | ICD-10-CM | POA: Diagnosis not present

## 2013-06-06 DIAGNOSIS — N183 Chronic kidney disease, stage 3 unspecified: Secondary | ICD-10-CM | POA: Diagnosis not present

## 2013-06-23 ENCOUNTER — Encounter (HOSPITAL_COMMUNITY): Payer: Self-pay | Admitting: Emergency Medicine

## 2013-06-23 ENCOUNTER — Emergency Department (HOSPITAL_COMMUNITY)
Admission: EM | Admit: 2013-06-23 | Discharge: 2013-06-23 | Disposition: A | Discharge status: Home / Self Care | Payer: Medicare Other | Attending: Emergency Medicine | Admitting: Emergency Medicine

## 2013-06-23 ENCOUNTER — Emergency Department (HOSPITAL_COMMUNITY): Payer: Medicare Other

## 2013-06-23 DIAGNOSIS — N189 Chronic kidney disease, unspecified: Secondary | ICD-10-CM | POA: Diagnosis not present

## 2013-06-23 DIAGNOSIS — IMO0002 Reserved for concepts with insufficient information to code with codable children: Secondary | ICD-10-CM | POA: Diagnosis not present

## 2013-06-23 DIAGNOSIS — Z8669 Personal history of other diseases of the nervous system and sense organs: Secondary | ICD-10-CM | POA: Insufficient documentation

## 2013-06-23 DIAGNOSIS — Y929 Unspecified place or not applicable: Secondary | ICD-10-CM | POA: Insufficient documentation

## 2013-06-23 DIAGNOSIS — Z862 Personal history of diseases of the blood and blood-forming organs and certain disorders involving the immune mechanism: Secondary | ICD-10-CM | POA: Diagnosis not present

## 2013-06-23 DIAGNOSIS — Z7982 Long term (current) use of aspirin: Secondary | ICD-10-CM | POA: Diagnosis not present

## 2013-06-23 DIAGNOSIS — S92309A Fracture of unspecified metatarsal bone(s), unspecified foot, initial encounter for closed fracture: Secondary | ICD-10-CM | POA: Insufficient documentation

## 2013-06-23 DIAGNOSIS — S92301A Fracture of unspecified metatarsal bone(s), right foot, initial encounter for closed fracture: Secondary | ICD-10-CM

## 2013-06-23 DIAGNOSIS — Y9389 Activity, other specified: Secondary | ICD-10-CM | POA: Insufficient documentation

## 2013-06-23 DIAGNOSIS — X500XXA Overexertion from strenuous movement or load, initial encounter: Secondary | ICD-10-CM | POA: Insufficient documentation

## 2013-06-23 DIAGNOSIS — E119 Type 2 diabetes mellitus without complications: Secondary | ICD-10-CM | POA: Insufficient documentation

## 2013-06-23 DIAGNOSIS — Z79899 Other long term (current) drug therapy: Secondary | ICD-10-CM | POA: Insufficient documentation

## 2013-06-23 DIAGNOSIS — Z8679 Personal history of other diseases of the circulatory system: Secondary | ICD-10-CM | POA: Diagnosis not present

## 2013-06-23 DIAGNOSIS — Z8639 Personal history of other endocrine, nutritional and metabolic disease: Secondary | ICD-10-CM | POA: Insufficient documentation

## 2013-06-23 MED ORDER — HYDROCODONE-ACETAMINOPHEN 5-325 MG PO TABS: 1.0000 | ORAL_TABLET | Freq: Four times a day (QID) | ORAL | Status: DC | PRN

## 2013-06-23 NOTE — ED Provider Notes (Signed)
CSN: [id number]     Arrival date & time 06/23/13  1909 History  This chart was scribed for non-physician practitioner Jeannett Senior, PA-C, working with Ephraim Hamburger, MD by Zettie Pho, ED Scribe. This patient was seen in room WTR5/WLPT5 and the patient's care was started at 7:34 PM.    Chief Complaint  Patient presents with  . Foot Injury   The history is provided by the patient. No language interpreter was used.   HPI Comments: [name] is a 67 y.o. male who presents to the Emergency Department complaining of a constant, dull pain to the right foot onset earlier today when he reports hearing a pop while playing with his grandson. He states that the pain is exacerbated with walking and bearing weight. He reports that he applied ice and took 2 Tylenol at home with mild, temporary relief.   Past Medical History  Diagnosis Date  . Chronic kidney disease   . Neuropathy   . Atrial fibrillation   . Hyperlipidemia   . Diabetes mellitus    Past Surgical History  Procedure Laterality Date  . Achlles tendon repair    . Bowel repla      repair  . Access proceds    . Kidney transplant      24 and 2005  . Arteriovenous graft placement    . Ligation goretex graft  06/30/11    Right upper arm   Family History  Problem Relation Age of Onset  . Heart disease Mother   . Heart attack Father   . Stroke Sister    History  Substance Use Topics  . Smoking status: Never Smoker   . Smokeless tobacco: Not on file  . Alcohol Use: No    Review of Systems  Musculoskeletal: Positive for myalgias and joint swelling.  Skin: Negative for wound.  All other systems reviewed and are negative.    Allergies  Elavil  Home Medications   Current Outpatient Rx  Name  Route  Sig  Dispense  Refill  . aspirin 81 MG tablet   Oral   Take 81 mg by mouth daily.           . calcium carbonate 200 MG capsule   Oral   Take 250 mg by mouth. 3 times a week          . diphenhydrAMINE  (BENADRYL) 25 mg capsule   Oral   Take 25 mg by mouth at bedtime as needed.           . gabapentin (NEURONTIN) 100 MG capsule   Oral   Take 100 mg by mouth. Take three times/day as needed         . glipiZIDE (GLUCOTROL) 10 MG tablet   Oral   Take 10 mg by mouth 2 (two) times daily before a meal.           . linagliptin (TRADJENTA) 5 MG TABS tablet   Oral   Take 5 mg by mouth daily.           . mycophenolate (CELLCEPT) 500 MG tablet   Oral   Take by mouth 2 (two) times daily.           Marland Kitchen omega-3 acid ethyl esters (LOVAZA) 1 G capsule   Oral   Take 1 g by mouth daily. Take 4 caps per day         . prednisoLONE 5 MG TABS   Oral   Take 5 mg by  mouth daily.           . tacrolimus (PROGRAF) 1 MG capsule   Oral   Take 1 mg by mouth. Take 2 tabs in am and 1 tab at night          Triage Vitals: BP 172/121  Pulse 94  Temp(Src) 98.7 F (37.1 C) (Oral)  Resp 18  SpO2 100%  Physical Exam  Nursing note and vitals reviewed. Constitutional: He is oriented to person, place, and time. He appears well-developed and well-nourished. No distress.  HENT:  Head: Normocephalic and atraumatic.  Eyes: Conjunctivae are normal.  Neck: Normal range of motion. Neck supple.  Musculoskeletal: Normal range of motion.  Ankle joint is normal with full range of motion and no tenderness to palpation. Swelling and ecchymosis over right 5th metatarsal that is tender to palpation. All toes are normal with full range of motion.  Neurological: He is alert and oriented to person, place, and time.  Skin: Skin is warm and dry.  Psychiatric: He has a normal mood and affect. His behavior is normal.    ED Course  Procedures (including critical care time)  DIAGNOSTIC STUDIES: Oxygen Saturation is 100% on room air, normal by my interpretation.    COORDINATION OF CARE: 7:37PM- Will order an x-ray of the right foot. Discussed treatment plan with patient at bedside and patient verbalized  agreement.     Labs Review Labs Reviewed - No data to display  Imaging Review Dg Foot Complete Right  06/23/2013   CLINICAL DATA:  Traumatic injury and pain  EXAM: RIGHT FOOT COMPLETE - 3+ VIEW  COMPARISON:  None.  FINDINGS: Mildly displaced fractures of the base of the 5th metatarsal. Diffuse vascular calcifications are seen. Degenerative changes are noted of the 1st MTP joint.  IMPRESSION: Fracture of the 5th metatarsal at the base.   Electronically Signed   By: Inez Catalina M.D.   On: 06/23/2013 19:46    MDM   1. Fracture of 5th metatarsal, right, closed, initial encounter     Fracture of the base of 5th metatarsal. Post op shoe and crutches given. Pain medications at home. Follow up with [facility].   Filed Vitals:   06/23/13 1927  BP: 150/70  Pulse:   Temp:   Resp:     I personally performed the services described in this documentation, which was scribed in my presence. The recorded information has been reviewed and is accurate.   Renold Genta, PA-C 06/23/13 2019

## 2013-06-23 NOTE — ED Notes (Signed)
Pt c/o pain to R foot, pt states he felt pain and ?pop this am while playing with grandson. Bruising and swelling noted.

## 2013-06-25 DIAGNOSIS — S92309A Fracture of unspecified metatarsal bone(s), unspecified foot, initial encounter for closed fracture: Secondary | ICD-10-CM | POA: Diagnosis not present

## 2013-06-25 DIAGNOSIS — M25579 Pain in unspecified ankle and joints of unspecified foot: Secondary | ICD-10-CM | POA: Diagnosis not present

## 2013-06-25 NOTE — ED Provider Notes (Signed)
Medical screening examination/treatment/procedure(s) were performed by non-physician practitioner and as supervising physician I was immediately available for consultation/collaboration.   Ephraim Hamburger, MD 06/25/13 [phone]

## 2013-07-05 DIAGNOSIS — S92309A Fracture of unspecified metatarsal bone(s), unspecified foot, initial encounter for closed fracture: Secondary | ICD-10-CM | POA: Diagnosis not present

## 2013-07-19 DIAGNOSIS — S92309A Fracture of unspecified metatarsal bone(s), unspecified foot, initial encounter for closed fracture: Secondary | ICD-10-CM | POA: Diagnosis not present

## 2013-10-15 DIAGNOSIS — IMO0001 Reserved for inherently not codable concepts without codable children: Secondary | ICD-10-CM | POA: Diagnosis not present

## 2013-10-16 DIAGNOSIS — D509 Iron deficiency anemia, unspecified: Secondary | ICD-10-CM | POA: Diagnosis not present

## 2013-10-16 DIAGNOSIS — Z940 Kidney transplant status: Secondary | ICD-10-CM | POA: Diagnosis not present

## 2013-10-16 DIAGNOSIS — E785 Hyperlipidemia, unspecified: Secondary | ICD-10-CM | POA: Diagnosis not present

## 2013-10-16 DIAGNOSIS — E1129 Type 2 diabetes mellitus with other diabetic kidney complication: Secondary | ICD-10-CM | POA: Diagnosis not present

## 2013-10-17 DIAGNOSIS — N186 End stage renal disease: Secondary | ICD-10-CM | POA: Diagnosis not present

## 2013-10-17 DIAGNOSIS — Z940 Kidney transplant status: Secondary | ICD-10-CM | POA: Diagnosis not present

## 2013-10-17 DIAGNOSIS — I129 Hypertensive chronic kidney disease with stage 1 through stage 4 chronic kidney disease, or unspecified chronic kidney disease: Secondary | ICD-10-CM | POA: Diagnosis not present

## 2013-10-17 DIAGNOSIS — E118 Type 2 diabetes mellitus with unspecified complications: Secondary | ICD-10-CM | POA: Diagnosis not present

## 2013-10-30 DIAGNOSIS — Z79899 Other long term (current) drug therapy: Secondary | ICD-10-CM | POA: Diagnosis not present

## 2013-11-14 DIAGNOSIS — IMO0001 Reserved for inherently not codable concepts without codable children: Secondary | ICD-10-CM | POA: Diagnosis not present

## 2014-01-17 DIAGNOSIS — Z940 Kidney transplant status: Secondary | ICD-10-CM | POA: Diagnosis not present

## 2014-01-17 DIAGNOSIS — D509 Iron deficiency anemia, unspecified: Secondary | ICD-10-CM | POA: Diagnosis not present

## 2014-01-17 DIAGNOSIS — E785 Hyperlipidemia, unspecified: Secondary | ICD-10-CM | POA: Diagnosis not present

## 2014-01-22 DIAGNOSIS — N529 Male erectile dysfunction, unspecified: Secondary | ICD-10-CM | POA: Diagnosis not present

## 2014-01-22 DIAGNOSIS — E291 Testicular hypofunction: Secondary | ICD-10-CM | POA: Diagnosis not present

## 2014-01-22 DIAGNOSIS — N400 Enlarged prostate without lower urinary tract symptoms: Secondary | ICD-10-CM | POA: Diagnosis not present

## 2014-02-28 DIAGNOSIS — Z940 Kidney transplant status: Secondary | ICD-10-CM | POA: Diagnosis not present

## 2014-03-01 DIAGNOSIS — E118 Type 2 diabetes mellitus with unspecified complications: Secondary | ICD-10-CM | POA: Diagnosis not present

## 2014-03-01 DIAGNOSIS — E785 Hyperlipidemia, unspecified: Secondary | ICD-10-CM | POA: Diagnosis not present

## 2014-03-01 DIAGNOSIS — I129 Hypertensive chronic kidney disease with stage 1 through stage 4 chronic kidney disease, or unspecified chronic kidney disease: Secondary | ICD-10-CM | POA: Diagnosis not present

## 2014-03-01 DIAGNOSIS — Z940 Kidney transplant status: Secondary | ICD-10-CM | POA: Diagnosis not present

## 2014-03-13 DIAGNOSIS — E11329 Type 2 diabetes mellitus with mild nonproliferative diabetic retinopathy without macular edema: Secondary | ICD-10-CM | POA: Diagnosis not present

## 2014-03-13 DIAGNOSIS — H02839 Dermatochalasis of unspecified eye, unspecified eyelid: Secondary | ICD-10-CM | POA: Diagnosis not present

## 2014-03-13 DIAGNOSIS — H4011X0 Primary open-angle glaucoma, stage unspecified: Secondary | ICD-10-CM | POA: Diagnosis not present

## 2014-03-13 DIAGNOSIS — E1139 Type 2 diabetes mellitus with other diabetic ophthalmic complication: Secondary | ICD-10-CM | POA: Diagnosis not present

## 2014-03-13 DIAGNOSIS — H2510 Age-related nuclear cataract, unspecified eye: Secondary | ICD-10-CM | POA: Diagnosis not present

## 2014-03-19 DIAGNOSIS — IMO0001 Reserved for inherently not codable concepts without codable children: Secondary | ICD-10-CM | POA: Diagnosis not present

## 2014-04-10 DIAGNOSIS — H4011X0 Primary open-angle glaucoma, stage unspecified: Secondary | ICD-10-CM | POA: Diagnosis not present

## 2014-04-23 DIAGNOSIS — M79609 Pain in unspecified limb: Secondary | ICD-10-CM | POA: Diagnosis not present

## 2014-04-23 DIAGNOSIS — G609 Hereditary and idiopathic neuropathy, unspecified: Secondary | ICD-10-CM | POA: Diagnosis not present

## 2014-04-23 DIAGNOSIS — IMO0002 Reserved for concepts with insufficient information to code with codable children: Secondary | ICD-10-CM | POA: Diagnosis not present

## 2014-05-02 DIAGNOSIS — H4011X0 Primary open-angle glaucoma, stage unspecified: Secondary | ICD-10-CM | POA: Diagnosis not present

## 2014-05-14 DIAGNOSIS — IMO0002 Reserved for concepts with insufficient information to code with codable children: Secondary | ICD-10-CM | POA: Diagnosis not present

## 2014-05-16 DIAGNOSIS — H4011X0 Primary open-angle glaucoma, stage unspecified: Secondary | ICD-10-CM | POA: Diagnosis not present

## 2014-05-23 DIAGNOSIS — I739 Peripheral vascular disease, unspecified: Secondary | ICD-10-CM | POA: Diagnosis not present

## 2014-05-23 DIAGNOSIS — E1059 Type 1 diabetes mellitus with other circulatory complications: Secondary | ICD-10-CM | POA: Diagnosis not present

## 2014-05-23 DIAGNOSIS — I872 Venous insufficiency (chronic) (peripheral): Secondary | ICD-10-CM | POA: Diagnosis not present

## 2014-05-23 DIAGNOSIS — L608 Other nail disorders: Secondary | ICD-10-CM | POA: Diagnosis not present

## 2014-05-24 DIAGNOSIS — H4011X0 Primary open-angle glaucoma, stage unspecified: Secondary | ICD-10-CM | POA: Diagnosis not present

## 2014-06-25 DIAGNOSIS — H4011X1 Primary open-angle glaucoma, mild stage: Secondary | ICD-10-CM | POA: Diagnosis not present

## 2014-06-25 DIAGNOSIS — H2513 Age-related nuclear cataract, bilateral: Secondary | ICD-10-CM | POA: Diagnosis not present

## 2014-07-04 ENCOUNTER — Emergency Department (HOSPITAL_COMMUNITY)
Admission: EM | Admit: 2014-07-04 | Discharge: 2014-07-05 | Disposition: A | Discharge status: Home / Self Care | Payer: Medicare Other | Attending: Emergency Medicine | Admitting: Emergency Medicine

## 2014-07-04 DIAGNOSIS — Z7952 Long term (current) use of systemic steroids: Secondary | ICD-10-CM | POA: Insufficient documentation

## 2014-07-04 DIAGNOSIS — S8012XA Contusion of left lower leg, initial encounter: Secondary | ICD-10-CM | POA: Diagnosis not present

## 2014-07-04 DIAGNOSIS — N189 Chronic kidney disease, unspecified: Secondary | ICD-10-CM | POA: Diagnosis not present

## 2014-07-04 DIAGNOSIS — G629 Polyneuropathy, unspecified: Secondary | ICD-10-CM | POA: Insufficient documentation

## 2014-07-04 DIAGNOSIS — W228XXA Striking against or struck by other objects, initial encounter: Secondary | ICD-10-CM | POA: Diagnosis not present

## 2014-07-04 DIAGNOSIS — Z8679 Personal history of other diseases of the circulatory system: Secondary | ICD-10-CM | POA: Insufficient documentation

## 2014-07-04 DIAGNOSIS — Y9389 Activity, other specified: Secondary | ICD-10-CM | POA: Insufficient documentation

## 2014-07-04 DIAGNOSIS — S8992XA Unspecified injury of left lower leg, initial encounter: Secondary | ICD-10-CM | POA: Diagnosis present

## 2014-07-04 DIAGNOSIS — Z79899 Other long term (current) drug therapy: Secondary | ICD-10-CM | POA: Insufficient documentation

## 2014-07-04 DIAGNOSIS — Y9289 Other specified places as the place of occurrence of the external cause: Secondary | ICD-10-CM | POA: Diagnosis not present

## 2014-07-04 DIAGNOSIS — Z7982 Long term (current) use of aspirin: Secondary | ICD-10-CM | POA: Insufficient documentation

## 2014-07-04 DIAGNOSIS — Z794 Long term (current) use of insulin: Secondary | ICD-10-CM | POA: Diagnosis not present

## 2014-07-04 DIAGNOSIS — E119 Type 2 diabetes mellitus without complications: Secondary | ICD-10-CM | POA: Insufficient documentation

## 2014-07-04 NOTE — ED Notes (Signed)
Pt arrived to the ED with a complaint of left leg pain.  Pt state he hit his lower shin a week ago which caused a raised knot.  Pt states it has gotten better but is still sore and reddened.  Site feels warm.

## 2014-07-05 ENCOUNTER — Encounter (HOSPITAL_COMMUNITY): Payer: Self-pay | Admitting: Emergency Medicine

## 2014-07-05 NOTE — ED Provider Notes (Signed)
CSN: [id number]     Arrival date & time 07/04/14  2337 History   First MD Initiated Contact with Patient 07/05/14 0057     Chief Complaint  Patient presents with  . Leg Pain   HPI Patient presented to the emergency room for evaluation of a leg injury. The patient bruised his shin about one week ago causing a raised knot on the anterior aspect of his left lower shin.  The patient noticed some redness and swelling. Over the last week the redness has continued to decrease. The soreness has also decreased. However, it has not completely resolved. The patient does have a history of renal transplant and he was concerned about the possibility of a developing infection so he decided to come to the emergency room to get checked out. Past Medical History  Diagnosis Date  . Chronic kidney disease   . Neuropathy   . Atrial fibrillation   . Hyperlipidemia   . Diabetes mellitus    Past Surgical History  Procedure Laterality Date  . Achlles tendon repair    . Bowel repla      repair  . Access proceds    . Kidney transplant      8 and 2005  . Arteriovenous graft placement    . Ligation goretex graft  06/30/11    Right upper arm   Family History  Problem Relation Age of Onset  . Heart disease Mother   . Heart attack Father   . Stroke Sister    History  Substance Use Topics  . Smoking status: Never Smoker   . Smokeless tobacco: Not on file  . Alcohol Use: No    Review of Systems  All other systems reviewed and are negative.     Allergies  Elavil  Home Medications   Prior to Admission medications   Medication Sig Start Date End Date Taking? Authorizing Provider  aspirin 81 MG tablet Take 81 mg by mouth daily.     Yes Historical Provider, MD  calcium carbonate 200 MG capsule Take 250 mg by mouth every Monday, Wednesday, and Friday. 3 times a week   Yes Historical Provider, MD  CRESTOR 5 MG tablet Take 5 mg by mouth daily. 06/13/14  Yes Historical Provider, MD  diphenhydrAMINE  (BENADRYL) 25 mg capsule Take 25 mg by mouth at bedtime as needed (for sleep/allergies.).    Yes Historical Provider, MD  furosemide (LASIX) 40 MG tablet Take 40 mg by mouth daily as needed. Fluid. 06/17/14  Yes Historical Provider, MD  gabapentin (NEURONTIN) 100 MG capsule Take 100 mg by mouth 3 (three) times daily as needed (for nerve pain). Take three times/day as needed   Yes Historical Provider, MD  glipiZIDE (GLUCOTROL) 10 MG tablet Take 10 mg by mouth 2 (two) times daily before a meal.     Yes Historical Provider, MD  LANTUS SOLOSTAR 100 UNIT/ML Solostar Pen Inject 9 Units into the skin every morning. 06/04/14  Yes Historical Provider, MD  linagliptin (TRADJENTA) 5 MG TABS tablet Take 5 mg by mouth daily.     Yes Historical Provider, MD  mycophenolate (CELLCEPT) 500 MG tablet Take 500 mg by mouth 2 (two) times daily.    Yes Historical Provider, MD  omega-3 acid ethyl esters (LOVAZA) 1 G capsule Take 4 g by mouth daily.    Yes Historical Provider, MD  prednisoLONE 5 MG TABS Take 5 mg by mouth daily.     Yes Historical Provider, MD  tacrolimus (PROGRAF) 1 MG  capsule Take 2 mg by mouth 2 (two) times daily.    Yes Historical Provider, MD   BP 110/40  Pulse 76  Temp(Src) 97.9 F (36.6 C) (Oral)  Resp 18  Ht 5\' 8"  (1.727 m)  Wt 170 lb (77.111 kg)  BMI 25.85 kg/m2  SpO2 100% Physical Exam  Nursing note and vitals reviewed. Constitutional: He appears well-developed and well-nourished. No distress.  HENT:  Head: Normocephalic and atraumatic.  Right Ear: External ear normal.  Left Ear: External ear normal.  Eyes: Conjunctivae are normal. Right eye exhibits no discharge. Left eye exhibits no discharge. No scleral icterus.  Neck: Neck supple. No tracheal deviation present.  Cardiovascular: Normal rate.   Pulmonary/Chest: Effort normal. No stridor. No respiratory distress.  Musculoskeletal: He exhibits edema.  Small circumscribed area of mild edema and erythema proximally 2-3 cm on the  anterior aspect of the left lower shin, no increased warmth, no lymphangitic streaking,  Neurological: He is alert. Cranial nerve deficit: no gross deficits.  Skin: Skin is warm and dry. No rash noted.  Psychiatric: He has a normal mood and affect.    ED Course  Procedures (including critical care time) Labs Review Labs Reviewed - No data to display  Imaging Review No results found.   EKG Interpretation None      MDM   Final diagnoses:  Contusion of leg, left, initial encounter    Patient's exam is consistent with a resolving contusion. I doubt infection. He has noticed that the redness has been decreasing.  I cautioned him to monitor for increasing swelling and redness. Increasing pain would be another concern. At this time I do not feel there is an infection and antibiotics are not indicated.   Dorie Rank, MD 07/05/14 [phone]

## 2014-07-05 NOTE — Discharge Instructions (Signed)
Contusion °A contusion is a deep bruise. Contusions happen when an injury causes bleeding under the skin. Signs of bruising include pain, puffiness (swelling), and discolored skin. The contusion may turn blue, purple, or yellow. °HOME CARE  °· Put ice on the injured area. °¨ Put ice in a plastic bag. °¨ Place a towel between your skin and the bag. °¨ Leave the ice on for 15-20 minutes, 03-04 times a day. °· Only take medicine as told by your doctor. °· Rest the injured area. °· If possible, raise (elevate) the injured area to lessen puffiness. °GET HELP RIGHT AWAY IF:  °· You have more bruising or puffiness. °· You have pain that is getting worse. °· Your puffiness or pain is not helped by medicine. °MAKE SURE YOU:  °· Understand these instructions. °· Will watch your condition. °· Will get help right away if you are not doing well or get worse. °Document Released: 02/23/2008 Document Revised: 11/29/2011 Document Reviewed: 07/12/2011 °ExitCare® Patient Information ©2015 [employer]. This information is not intended to replace advice given to you by your health care provider. Make sure you discuss any questions you have with your health care provider. ° °

## 2014-07-15 DIAGNOSIS — Z09 Encounter for follow-up examination after completed treatment for conditions other than malignant neoplasm: Secondary | ICD-10-CM | POA: Diagnosis not present

## 2014-07-15 DIAGNOSIS — Z86010 Personal history of colonic polyps: Secondary | ICD-10-CM | POA: Diagnosis not present

## 2014-07-15 DIAGNOSIS — K5730 Diverticulosis of large intestine without perforation or abscess without bleeding: Secondary | ICD-10-CM | POA: Diagnosis not present

## 2014-07-17 DIAGNOSIS — Z940 Kidney transplant status: Secondary | ICD-10-CM | POA: Diagnosis not present

## 2014-07-17 DIAGNOSIS — D509 Iron deficiency anemia, unspecified: Secondary | ICD-10-CM | POA: Diagnosis not present

## 2014-07-18 DIAGNOSIS — I129 Hypertensive chronic kidney disease with stage 1 through stage 4 chronic kidney disease, or unspecified chronic kidney disease: Secondary | ICD-10-CM | POA: Diagnosis not present

## 2014-07-18 DIAGNOSIS — Z23 Encounter for immunization: Secondary | ICD-10-CM | POA: Diagnosis not present

## 2014-07-18 DIAGNOSIS — E1129 Type 2 diabetes mellitus with other diabetic kidney complication: Secondary | ICD-10-CM | POA: Diagnosis not present

## 2014-07-18 DIAGNOSIS — D509 Iron deficiency anemia, unspecified: Secondary | ICD-10-CM | POA: Diagnosis not present

## 2014-07-18 DIAGNOSIS — Z940 Kidney transplant status: Secondary | ICD-10-CM | POA: Diagnosis not present

## 2014-07-23 DIAGNOSIS — E1165 Type 2 diabetes mellitus with hyperglycemia: Secondary | ICD-10-CM | POA: Diagnosis not present

## 2014-08-08 DIAGNOSIS — L603 Nail dystrophy: Secondary | ICD-10-CM | POA: Diagnosis not present

## 2014-08-08 DIAGNOSIS — E1051 Type 1 diabetes mellitus with diabetic peripheral angiopathy without gangrene: Secondary | ICD-10-CM | POA: Diagnosis not present

## 2014-08-08 DIAGNOSIS — I739 Peripheral vascular disease, unspecified: Secondary | ICD-10-CM | POA: Diagnosis not present

## 2014-09-04 ENCOUNTER — Encounter: Payer: Medicare Other | Attending: Endocrinology | Admitting: *Deleted

## 2014-09-04 ENCOUNTER — Encounter: Payer: Self-pay | Admitting: *Deleted

## 2014-09-04 VITALS — Ht 68.0 in | Wt 172.0 lb

## 2014-09-04 DIAGNOSIS — E119 Type 2 diabetes mellitus without complications: Secondary | ICD-10-CM | POA: Insufficient documentation

## 2014-09-04 DIAGNOSIS — Z713 Dietary counseling and surveillance: Secondary | ICD-10-CM | POA: Insufficient documentation

## 2014-09-04 NOTE — Patient Instructions (Signed)
Plan:  Aim for 3 Carb Choices per meal (45 grams) +/- 1 either way  Aim for 0-15 Carbs per snack if hungry  Include protein in moderation with your meals and snacks Consider reading food labels for Total Carbohydrate and Fat Grams of foods Consider  increasing your activity level by walking for 30 minutes daily as tolerated Consider checking BG at alternate times per day as directed by MD  Consider taking medication as directed by MD  We need to work on the meals you eat at work during the evening: Consider PB&J sandwich on whole grain bread & fruit for dinner to take to work Yogurt Have a 15g snack before going to bed Only one Sweet Potato Pie in the house this year, have only small slice Avoid chicken skin

## 2014-09-05 NOTE — Progress Notes (Signed)
Diabetes Self-Management Education   Appt. Start Time: 0800 Appt. End Time: 0930  09/05/2014  [name], identified by name and date of birth, is a 68 y.o. male with a diagnosis of Diabetes: Type 2.  Other people present during visit:  Patient. 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 He is very active at work and doesn't find the time to sit down to a meal. Therefor he snacks of cheese and crackers and some poor choices. He has a weakness for sweets.  ASSESSMENT  Height 5\' 8"  (1.727 m), weight 172 lb (78.019 kg). Body mass index is 26.16 kg/(m^2).  Initial Visit Information:  Are you currently following a meal plan?: No Are you taking your medications as prescribed?: Yes (Sets up medications in a box weekly) Are you checking your feet?:  (Sees a podiatrist, diabetic neuropathy) How often do you need to have someone help you when you read instructions, pamphlets, or other written materials from your doctor or pharmacy?: 1 - Never   Psychosocial:   Patient Belief/Attitude about Diabetes: Motivated to manage diabetes Self-care barriers: None Self-management support: Doctor's office, Family, CDE visits Other persons present: Patient Patient Concerns: Nutrition/Meal planning, Glycemic Control Special Needs: None Preferred Learning Style: No preference indicated Learning Readiness: Change in progress  Complications:   Last HgB A1C per patient/outside source: 8.3 mg/dL How often do you check your blood sugar?:  (Test glucose every other morning) Fasting Blood glucose range (mg/dL):  (FBS 90-135mg /dl) Postprandial Blood glucose range (mg/dL): 130-179 Number of hypoglycemic episodes per month: 0 Number of hyperglycemic episodes per week:  (2 times a month related to dietary intake) Have you had a dilated eye exam in the past 12 months?: Yes Have you had a dental exam in the past 12  months?: Yes  Diet Intake:  Breakfast: cereal bar, coffee, light yogurt with high protein with nuts, sunflower seeds / fried egg, 2 slices bacon, 2 toast, tomatoes Snack (morning): none Lunch: chick-filet chicken strips, cole slaw, double hamburger, raw carrotts, celery, coke zero Snack (afternoon): none Dinner: Lance Peanut  butter crackers 2 packs, Indulgent Bars Beverage(s): coke Zero, water, coffee,  Exercise:  Exercise: ADL's (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 Up and walking all shift.)  Individualized Plan for Diabetes Self-Management Training:   Learning Objective:  Patient will have a greater understanding of diabetes self-management. Patient education plan per assessed needs and concerns is to attend individual sessions      Education Topics Reviewed with Patient Today:  Factors that contribute to the development of diabetes, Explored patient's options for treatment of their diabetes Role of diet in the treatment of diabetes and the relationship between the three main macronutrients and blood glucose level, Food label reading, portion sizes and measuring food., Carbohydrate counting, Meal options for control of blood glucose level and chronic complications. Role of exercise on diabetes management, blood pressure control and cardiac health. Identified appropriate SMBG and/or A1C goals., Daily foot exams, Yearly dilated eye exam Relationship between chronic complications and blood glucose control, Assessed and discussed foot care and prevention of foot problems, Lipid levels, blood glucose control and heart disease, Dental care, Identified and discussed with patient  current chronic complications Worked with patient to identify barriers to care and solutions, Identified and addressed patients feelings and concerns about diabetes, Brainstormed with patient on coping mechanisms for social situations, getting support from significant others, dealing with feelings about diabetes Lifestyle  issues that need to be addressed for better diabetes care (Primary dietary concern is continueal snacking at work in the evening.. Need to pack balanced meal to take to work. We explored various options.)  PATIENTS GOALS/Plan (Developed by the patient):  Nutrition: General guidelines for healthy choices and portions discussed Physical Activity: Exercise 3-5 times per week, 30 minutes per day Medications: take my medication as prescribed   Patient Instructions  Plan:  Aim for 3 Carb Choices per meal (45 grams) +/- 1 either way  Aim for 0-15 Carbs per snack if hungry  Include protein in moderation with your meals and snacks Consider reading food labels for Total Carbohydrate and Fat Grams of foods Consider  increasing your activity level by walking for 30 minutes daily as tolerated Consider checking BG at alternate times per day as directed by MD  Consider taking medication as directed by MD  We need to work on the meals you eat at work during the evening: Consider PB&J sandwich on whole grain bread & fruit for dinner to take to work Yogurt Have a 15g snack before going to bed Only one Sweet Potato Pie in the house this year, have only small slice Avoid chicken skin  Expected Outcomes:  Demonstrated interest in learning. Expect positive outcomes  Education material provided: Living Well with Diabetes, A1C conversion sheet, Meal plan card, My Plate and Snack sheet  If problems or questions, patient to contact team via:  Phone  Future DSME appointment: PRN

## 2014-10-24 DIAGNOSIS — E1051 Type 1 diabetes mellitus with diabetic peripheral angiopathy without gangrene: Secondary | ICD-10-CM | POA: Diagnosis not present

## 2014-10-24 DIAGNOSIS — I739 Peripheral vascular disease, unspecified: Secondary | ICD-10-CM | POA: Diagnosis not present

## 2014-10-24 DIAGNOSIS — L603 Nail dystrophy: Secondary | ICD-10-CM | POA: Diagnosis not present

## 2014-11-13 DIAGNOSIS — D509 Iron deficiency anemia, unspecified: Secondary | ICD-10-CM | POA: Diagnosis not present

## 2014-11-13 DIAGNOSIS — E785 Hyperlipidemia, unspecified: Secondary | ICD-10-CM | POA: Diagnosis not present

## 2014-11-13 DIAGNOSIS — N186 End stage renal disease: Secondary | ICD-10-CM | POA: Diagnosis not present

## 2014-11-13 DIAGNOSIS — D649 Anemia, unspecified: Secondary | ICD-10-CM | POA: Diagnosis not present

## 2014-11-14 DIAGNOSIS — D509 Iron deficiency anemia, unspecified: Secondary | ICD-10-CM | POA: Diagnosis not present

## 2014-11-14 DIAGNOSIS — I129 Hypertensive chronic kidney disease with stage 1 through stage 4 chronic kidney disease, or unspecified chronic kidney disease: Secondary | ICD-10-CM | POA: Diagnosis not present

## 2014-11-14 DIAGNOSIS — Z940 Kidney transplant status: Secondary | ICD-10-CM | POA: Diagnosis not present

## 2014-11-14 DIAGNOSIS — N186 End stage renal disease: Secondary | ICD-10-CM | POA: Diagnosis not present

## 2014-11-25 DIAGNOSIS — E1165 Type 2 diabetes mellitus with hyperglycemia: Secondary | ICD-10-CM | POA: Diagnosis not present

## 2014-11-25 DIAGNOSIS — G609 Hereditary and idiopathic neuropathy, unspecified: Secondary | ICD-10-CM | POA: Diagnosis not present

## 2014-11-25 DIAGNOSIS — I10 Essential (primary) hypertension: Secondary | ICD-10-CM | POA: Diagnosis not present

## 2014-11-25 DIAGNOSIS — E780 Pure hypercholesterolemia: Secondary | ICD-10-CM | POA: Diagnosis not present

## 2015-01-02 DIAGNOSIS — L603 Nail dystrophy: Secondary | ICD-10-CM | POA: Diagnosis not present

## 2015-01-02 DIAGNOSIS — I739 Peripheral vascular disease, unspecified: Secondary | ICD-10-CM | POA: Diagnosis not present

## 2015-01-02 DIAGNOSIS — E1051 Type 1 diabetes mellitus with diabetic peripheral angiopathy without gangrene: Secondary | ICD-10-CM | POA: Diagnosis not present

## 2015-01-14 DIAGNOSIS — E11319 Type 2 diabetes mellitus with unspecified diabetic retinopathy without macular edema: Secondary | ICD-10-CM | POA: Diagnosis not present

## 2015-01-14 DIAGNOSIS — H25813 Combined forms of age-related cataract, bilateral: Secondary | ICD-10-CM | POA: Diagnosis not present

## 2015-01-14 DIAGNOSIS — H04123 Dry eye syndrome of bilateral lacrimal glands: Secondary | ICD-10-CM | POA: Diagnosis not present

## 2015-01-14 DIAGNOSIS — E11329 Type 2 diabetes mellitus with mild nonproliferative diabetic retinopathy without macular edema: Secondary | ICD-10-CM | POA: Diagnosis not present

## 2015-01-14 DIAGNOSIS — H4011X1 Primary open-angle glaucoma, mild stage: Secondary | ICD-10-CM | POA: Diagnosis not present

## 2015-03-06 DIAGNOSIS — G609 Hereditary and idiopathic neuropathy, unspecified: Secondary | ICD-10-CM | POA: Diagnosis not present

## 2015-03-06 DIAGNOSIS — E1165 Type 2 diabetes mellitus with hyperglycemia: Secondary | ICD-10-CM | POA: Diagnosis not present

## 2015-03-06 DIAGNOSIS — I10 Essential (primary) hypertension: Secondary | ICD-10-CM | POA: Diagnosis not present

## 2015-03-06 DIAGNOSIS — E780 Pure hypercholesterolemia: Secondary | ICD-10-CM | POA: Diagnosis not present

## 2015-03-20 DIAGNOSIS — I739 Peripheral vascular disease, unspecified: Secondary | ICD-10-CM | POA: Diagnosis not present

## 2015-03-20 DIAGNOSIS — E1051 Type 1 diabetes mellitus with diabetic peripheral angiopathy without gangrene: Secondary | ICD-10-CM | POA: Diagnosis not present

## 2015-03-20 DIAGNOSIS — L603 Nail dystrophy: Secondary | ICD-10-CM | POA: Diagnosis not present

## 2015-03-21 DIAGNOSIS — N2581 Secondary hyperparathyroidism of renal origin: Secondary | ICD-10-CM | POA: Diagnosis not present

## 2015-03-21 DIAGNOSIS — I129 Hypertensive chronic kidney disease with stage 1 through stage 4 chronic kidney disease, or unspecified chronic kidney disease: Secondary | ICD-10-CM | POA: Diagnosis not present

## 2015-03-21 DIAGNOSIS — Z940 Kidney transplant status: Secondary | ICD-10-CM | POA: Diagnosis not present

## 2015-03-21 DIAGNOSIS — Z79899 Other long term (current) drug therapy: Secondary | ICD-10-CM | POA: Diagnosis not present

## 2015-03-21 DIAGNOSIS — E785 Hyperlipidemia, unspecified: Secondary | ICD-10-CM | POA: Diagnosis not present

## 2015-03-21 DIAGNOSIS — N186 End stage renal disease: Secondary | ICD-10-CM | POA: Diagnosis not present

## 2015-03-21 DIAGNOSIS — D509 Iron deficiency anemia, unspecified: Secondary | ICD-10-CM | POA: Diagnosis not present

## 2015-03-21 DIAGNOSIS — E1129 Type 2 diabetes mellitus with other diabetic kidney complication: Secondary | ICD-10-CM | POA: Diagnosis not present

## 2015-05-22 DIAGNOSIS — M7672 Peroneal tendinitis, left leg: Secondary | ICD-10-CM | POA: Diagnosis not present

## 2015-06-05 DIAGNOSIS — E1051 Type 1 diabetes mellitus with diabetic peripheral angiopathy without gangrene: Secondary | ICD-10-CM | POA: Diagnosis not present

## 2015-06-05 DIAGNOSIS — L603 Nail dystrophy: Secondary | ICD-10-CM | POA: Diagnosis not present

## 2015-06-05 DIAGNOSIS — I739 Peripheral vascular disease, unspecified: Secondary | ICD-10-CM | POA: Diagnosis not present

## 2015-07-17 DIAGNOSIS — H04123 Dry eye syndrome of bilateral lacrimal glands: Secondary | ICD-10-CM | POA: Diagnosis not present

## 2015-07-17 DIAGNOSIS — H401131 Primary open-angle glaucoma, bilateral, mild stage: Secondary | ICD-10-CM | POA: Diagnosis not present

## 2015-07-17 DIAGNOSIS — E119 Type 2 diabetes mellitus without complications: Secondary | ICD-10-CM | POA: Diagnosis not present

## 2015-07-17 DIAGNOSIS — H25813 Combined forms of age-related cataract, bilateral: Secondary | ICD-10-CM | POA: Diagnosis not present

## 2015-07-22 DIAGNOSIS — Z940 Kidney transplant status: Secondary | ICD-10-CM | POA: Diagnosis not present

## 2015-07-22 DIAGNOSIS — D509 Iron deficiency anemia, unspecified: Secondary | ICD-10-CM | POA: Diagnosis not present

## 2015-07-22 DIAGNOSIS — Z79899 Other long term (current) drug therapy: Secondary | ICD-10-CM | POA: Diagnosis not present

## 2015-07-22 DIAGNOSIS — E785 Hyperlipidemia, unspecified: Secondary | ICD-10-CM | POA: Diagnosis not present

## 2015-07-23 DIAGNOSIS — N2581 Secondary hyperparathyroidism of renal origin: Secondary | ICD-10-CM | POA: Diagnosis not present

## 2015-07-23 DIAGNOSIS — E1129 Type 2 diabetes mellitus with other diabetic kidney complication: Secondary | ICD-10-CM | POA: Diagnosis not present

## 2015-07-23 DIAGNOSIS — E118 Type 2 diabetes mellitus with unspecified complications: Secondary | ICD-10-CM | POA: Diagnosis not present

## 2015-07-23 DIAGNOSIS — I129 Hypertensive chronic kidney disease with stage 1 through stage 4 chronic kidney disease, or unspecified chronic kidney disease: Secondary | ICD-10-CM | POA: Diagnosis not present

## 2015-07-23 DIAGNOSIS — D509 Iron deficiency anemia, unspecified: Secondary | ICD-10-CM | POA: Diagnosis not present

## 2015-07-23 DIAGNOSIS — I739 Peripheral vascular disease, unspecified: Secondary | ICD-10-CM | POA: Diagnosis not present

## 2015-07-23 DIAGNOSIS — Z940 Kidney transplant status: Secondary | ICD-10-CM | POA: Diagnosis not present

## 2015-07-23 DIAGNOSIS — E785 Hyperlipidemia, unspecified: Secondary | ICD-10-CM | POA: Diagnosis not present

## 2015-07-23 DIAGNOSIS — Z79899 Other long term (current) drug therapy: Secondary | ICD-10-CM | POA: Diagnosis not present

## 2015-07-23 DIAGNOSIS — N186 End stage renal disease: Secondary | ICD-10-CM | POA: Diagnosis not present

## 2015-07-30 DIAGNOSIS — Z23 Encounter for immunization: Secondary | ICD-10-CM | POA: Diagnosis not present

## 2015-09-09 DIAGNOSIS — E7800 Pure hypercholesterolemia, unspecified: Secondary | ICD-10-CM | POA: Diagnosis not present

## 2015-09-09 DIAGNOSIS — I10 Essential (primary) hypertension: Secondary | ICD-10-CM | POA: Diagnosis not present

## 2015-09-09 DIAGNOSIS — G609 Hereditary and idiopathic neuropathy, unspecified: Secondary | ICD-10-CM | POA: Diagnosis not present

## 2015-09-09 DIAGNOSIS — E1165 Type 2 diabetes mellitus with hyperglycemia: Secondary | ICD-10-CM | POA: Diagnosis not present

## 2015-09-18 DIAGNOSIS — L603 Nail dystrophy: Secondary | ICD-10-CM | POA: Diagnosis not present

## 2015-09-18 DIAGNOSIS — M2041 Other hammer toe(s) (acquired), right foot: Secondary | ICD-10-CM | POA: Diagnosis not present

## 2015-09-18 DIAGNOSIS — E1051 Type 1 diabetes mellitus with diabetic peripheral angiopathy without gangrene: Secondary | ICD-10-CM | POA: Diagnosis not present

## 2015-09-18 DIAGNOSIS — M2042 Other hammer toe(s) (acquired), left foot: Secondary | ICD-10-CM | POA: Diagnosis not present

## 2015-09-18 DIAGNOSIS — I739 Peripheral vascular disease, unspecified: Secondary | ICD-10-CM | POA: Diagnosis not present

## 2015-09-19 DIAGNOSIS — L97909 Non-pressure chronic ulcer of unspecified part of unspecified lower leg with unspecified severity: Secondary | ICD-10-CM | POA: Diagnosis not present

## 2015-09-24 ENCOUNTER — Ambulatory Visit: Payer: Medicare Other | Admitting: Surgery

## 2015-09-29 ENCOUNTER — Encounter: Payer: Medicare Other | Attending: Surgery | Admitting: Surgery

## 2015-09-29 DIAGNOSIS — I87312 Chronic venous hypertension (idiopathic) with ulcer of left lower extremity: Secondary | ICD-10-CM | POA: Diagnosis not present

## 2015-09-29 DIAGNOSIS — N189 Chronic kidney disease, unspecified: Secondary | ICD-10-CM | POA: Insufficient documentation

## 2015-09-29 DIAGNOSIS — E11622 Type 2 diabetes mellitus with other skin ulcer: Secondary | ICD-10-CM | POA: Diagnosis not present

## 2015-09-29 DIAGNOSIS — E1122 Type 2 diabetes mellitus with diabetic chronic kidney disease: Secondary | ICD-10-CM | POA: Diagnosis not present

## 2015-09-29 DIAGNOSIS — Z940 Kidney transplant status: Secondary | ICD-10-CM | POA: Insufficient documentation

## 2015-09-29 DIAGNOSIS — I739 Peripheral vascular disease, unspecified: Secondary | ICD-10-CM | POA: Insufficient documentation

## 2015-09-29 DIAGNOSIS — L97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er exposed: Secondary | ICD-10-CM | POA: Diagnosis not present

## 2015-09-29 DIAGNOSIS — I4891 Unspecified atrial fibrillation: Secondary | ICD-10-CM | POA: Insufficient documentation

## 2015-09-30 ENCOUNTER — Other Ambulatory Visit: Payer: Self-pay | Admitting: *Deleted

## 2015-09-30 DIAGNOSIS — I739 Peripheral vascular disease, unspecified: Secondary | ICD-10-CM

## 2015-09-30 DIAGNOSIS — I83029 Varicose veins of left lower extremity with ulcer of unspecified site: Secondary | ICD-10-CM

## 2015-09-30 NOTE — Progress Notes (Signed)
[name], [name] ([id number]) Visit Report for 09/29/2015 Abuse/Suicide Risk Screen Details Patient Name: [name], [name] Date of Service: 09/29/2015 10:00 AM Medical Record Patient Account Number: [id number] Number: Treating RN: Ahmed Prima Date of Birth/Sex: 10/25/1945 (70 y.o. Male) Other Clinician: Marval Regal, Treating BURNS III, Primary Care Physician: Broadus John Physician/Extender: Randa Ngo, Referring Physician: Cherre Blanc in Treatment: 0 Abuse/Suicide Risk Screen Items Answer ABUSE/SUICIDE RISK SCREEN: Has anyone close to you tried to hurt or harm you recentlyo No Do you feel uncomfortable with anyone in your familyo No Has anyone forced you do things that you didnot want to doo No Do you have any thoughts of harming yourselfo No Patient displays signs or symptoms of abuse and/or neglect. No Electronic Signature(s) Signed: 09/29/2015 4:59:01 PM By: Alric Quan Entered By: Alric Quan on 09/29/2015 10:33:42 [name] ([id number]) -------------------------------------------------------------------------------- Activities of Daily Living Details Patient Name: [name] Date of Service: 09/29/2015 10:00 AM Medical Record Patient Account Number: [id number] Number: Treating RN: Ahmed Prima Date of Birth/Sex: 1946/02/21 (70 y.o. Male) Other Clinician: Marval Regal, Treating BURNS III, Primary Care Physician: Broadus John Physician/Extender: Randa Ngo, Referring Physician: Cherre Blanc in Treatment: 0 Activities of Daily Living Items Answer Activities of Daily Living (Please select one for each item) Drive Automobile Completely Able Take Medications Completely Able Use Telephone Completely [place] for Appearance Completely Able Use Toilet Completely Able Bath / Shower Completely Able Dress Self Completely Able Feed Self Completely Able Walk Completely Able Get In / Out Bed Completely Able Housework Completely  Able Prepare Meals Completely [place] for Self Completely Able Electronic Signature(s) Signed: 09/29/2015 4:59:01 PM By: Alric Quan Entered By: Alric Quan on 09/29/2015 10:34:07 [name] ([id number]) -------------------------------------------------------------------------------- Education Assessment Details Patient Name: [name] Date of Service: 09/29/2015 10:00 AM Medical Record Patient Account Number: [id number] Number: Treating RN: Ahmed Prima Date of Birth/Sex: [birth year] (70 y.o. Male) Other Clinician: Marval Regal, Treating BURNS III, Primary Care Physician: Broadus John Physician/Extender: Randa Ngo, Referring Physician: Cherre Blanc in Treatment: 0 Primary Learner Assessed: Patient Learning Preferences/Education Level/Primary Language Learning Preference: Explanation, Printed Material Highest Education Level: College or Above Preferred Language: English Cognitive Barrier Assessment/Beliefs Language Barrier: No Translator Needed: No Memory Deficit: No Emotional Barrier: No Cultural/Religious Beliefs Affecting Medical No Care: Physical Barrier Assessment Impaired Vision: Yes Glasses Impaired Hearing: No Decreased Hand dexterity: No Knowledge/Comprehension Assessment Knowledge Level: High Comprehension Level: High Ability to understand written High instructions: Ability to understand verbal High instructions: Motivation Assessment Anxiety Level: Calm Cooperation: Cooperative Education Importance: Acknowledges Need Interest in Health Problems: Asks Questions Perception: Coherent Willingness to Engage in Self- High Management Activities: [name], [name] ([id number]) Readiness to Engage in Self- Management Activities: Electronic Signature(s) Signed: 09/29/2015 4:59:01 PM By: Alric Quan Entered By: Alric Quan on 09/29/2015 10:35:21 [name], [name]  ([id number]) -------------------------------------------------------------------------------- Fall Risk Assessment Details Patient Name: [name] Date of Service: 09/29/2015 10:00 AM Medical Record Patient Account Number: [id number] Number: Treating RN: Ahmed Prima Date of Birth/Sex: 04-26-1946 (70 y.o. Male) Other Clinician: Marval Regal, Treating BURNS III, Primary Care Physician: Broadus John Physician/Extender: Randa Ngo, Referring Physician: Cherre Blanc in Treatment: 0 Fall Risk Assessment Items Have you had 2 or more falls in the last 12 monthso 0 No Have you had any fall that resulted in injury in the last 12 monthso 0 No FALL RISK ASSESSMENT: History of falling - immediate or within 3 months 0 No Secondary diagnosis 0 No Ambulatory aid None/bed rest/wheelchair/nurse 0 No Crutches/cane/walker 0 No Furniture 0 No IV  Access/Saline Lock 0 No Gait/Training Normal/bed rest/immobile 0 No Weak 0 No Impaired 0 No Mental Status Oriented to own ability 0 No Electronic Signature(s) Signed: 09/29/2015 4:59:01 PM By: Alric Quan Entered By: Alric Quan on 09/29/2015 10:36:21 [name] ([id number]) -------------------------------------------------------------------------------- Foot Assessment Details Patient Name: [name] Date of Service: 09/29/2015 10:00 AM Medical Record Patient Account Number: [id number] Number: Treating RN: Ahmed Prima Date of Birth/Sex: [birth year] (70 y.o. Male) Other Clinician: Marval Regal, Treating BURNS III, Primary Care Physician: Broadus John Physician/Extender: Randa Ngo, Referring Physician: Cherre Blanc in Treatment: 0 Foot Assessment Items Site Locations + = Sensation present, - = Sensation absent, C = Callus, U = Ulcer R = Redness, W = Warmth, M = Maceration, PU = Pre-ulcerative lesion F = Fissure, S = Swelling, D = Dryness Assessment Right: Left: Other Deformity: No  No Prior Foot Ulcer: No No Prior Amputation: No No Charcot Joint: No No Ambulatory Status: Ambulatory Without Help Gait: Steady Electronic Signature(s) Signed: 09/29/2015 4:59:01 PM By: Alric Quan Entered By: Alric Quan on 09/29/2015 10:37:56 [name] ([id number]) [place], [name] -------------------------------------------------------------------------------- Nutrition Risk Assessment Details Patient Name: [name] Date of Service: 09/29/2015 10:00 AM Medical Record Patient Account Number: [id number] Number: Treating RN: Ahmed Prima Date of Birth/Sex: 22-Mar-1946 (70 y.o. Male) Other Clinician: Marval Regal, Treating BURNS III, Primary Care Physician: Broadus John Physician/Extender: Randa Ngo, Referring Physician: Cherre Blanc in Treatment: 0 Height (in): 68 Weight (lbs): 178 Body Mass Index (BMI): 27.1 Nutrition Risk Assessment Items NUTRITION RISK SCREEN: I have an illness or condition that made me change the kind and/or 0 No amount of food I eat I eat fewer than two meals per day 3 Yes I eat few fruits and vegetables, or milk products 0 No I have three or more drinks of beer, liquor or wine almost every day 0 No I have tooth or mouth problems that make it hard for me to eat 0 No I don't always have enough money to buy the food I need 0 No I eat alone most of the time 0 No I take three or more different prescribed or over-the-counter drugs a 1 Yes day Without wanting to, I have lost or gained 10 pounds in the last six 0 No months I am not always physically able to shop, cook and/or feed myself 0 No Nutrition Protocols Good Risk Protocol Moderate Risk Protocol Electronic Signature(s) Signed: 09/29/2015 4:59:01 PM By: Alric Quan Entered By: Alric Quan on 09/29/2015 10:37:23

## 2015-10-03 ENCOUNTER — Ambulatory Visit (INDEPENDENT_AMBULATORY_CARE_PROVIDER_SITE_OTHER)
Admission: RE | Admit: 2015-10-03 | Discharge: 2015-10-03 | Disposition: A | Discharge status: Home / Self Care | Payer: Medicare Other | Source: Ambulatory Visit | Attending: Vascular Surgery | Admitting: Vascular Surgery

## 2015-10-03 ENCOUNTER — Ambulatory Visit (HOSPITAL_COMMUNITY)
Admission: RE | Admit: 2015-10-03 | Discharge: 2015-10-03 | Disposition: A | Discharge status: Home / Self Care | Payer: Medicare Other | Source: Ambulatory Visit | Attending: Vascular Surgery | Admitting: Vascular Surgery

## 2015-10-03 DIAGNOSIS — I739 Peripheral vascular disease, unspecified: Secondary | ICD-10-CM

## 2015-10-03 DIAGNOSIS — E11622 Type 2 diabetes mellitus with other skin ulcer: Secondary | ICD-10-CM | POA: Diagnosis not present

## 2015-10-03 DIAGNOSIS — I83029 Varicose veins of left lower extremity with ulcer of unspecified site: Secondary | ICD-10-CM | POA: Diagnosis not present

## 2015-10-03 DIAGNOSIS — L97229 Non-pressure chronic ulcer of left calf with unspecified severity: Secondary | ICD-10-CM | POA: Diagnosis not present

## 2015-10-03 DIAGNOSIS — Z940 Kidney transplant status: Secondary | ICD-10-CM | POA: Insufficient documentation

## 2015-10-06 ENCOUNTER — Encounter: Payer: Medicare Other | Admitting: Surgery

## 2015-10-06 ENCOUNTER — Ambulatory Visit
Admission: RE | Admit: 2015-10-06 | Discharge: 2015-10-06 | Disposition: A | Discharge status: Home / Self Care | Payer: Medicare Other | Source: Ambulatory Visit | Attending: Surgery | Admitting: Surgery

## 2015-10-06 ENCOUNTER — Other Ambulatory Visit: Payer: Self-pay | Admitting: Surgery

## 2015-10-06 DIAGNOSIS — S81802D Unspecified open wound, left lower leg, subsequent encounter: Secondary | ICD-10-CM | POA: Diagnosis not present

## 2015-10-06 DIAGNOSIS — M869 Osteomyelitis, unspecified: Secondary | ICD-10-CM

## 2015-10-06 DIAGNOSIS — E11622 Type 2 diabetes mellitus with other skin ulcer: Secondary | ICD-10-CM | POA: Diagnosis not present

## 2015-10-06 DIAGNOSIS — I87312 Chronic venous hypertension (idiopathic) with ulcer of left lower extremity: Secondary | ICD-10-CM | POA: Diagnosis not present

## 2015-10-06 DIAGNOSIS — I739 Peripheral vascular disease, unspecified: Secondary | ICD-10-CM | POA: Diagnosis not present

## 2015-10-06 DIAGNOSIS — L97221 Non-pressure chronic ulcer of left calf limited to breakdown of skin: Secondary | ICD-10-CM | POA: Diagnosis not present

## 2015-10-06 DIAGNOSIS — E1122 Type 2 diabetes mellitus with diabetic chronic kidney disease: Secondary | ICD-10-CM | POA: Diagnosis not present

## 2015-10-06 DIAGNOSIS — L97829 Non-pressure chronic ulcer of other part of left lower leg with unspecified severity: Secondary | ICD-10-CM | POA: Diagnosis not present

## 2015-10-06 DIAGNOSIS — Z940 Kidney transplant status: Secondary | ICD-10-CM | POA: Diagnosis not present

## 2015-10-06 DIAGNOSIS — L97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er exposed: Secondary | ICD-10-CM | POA: Diagnosis not present

## 2015-10-06 NOTE — Progress Notes (Addendum)
[name], [name] ([id number]) Visit Report for 10/06/2015 Chief Complaint Document Details Patient Name: [name], [name] Date of Service: 10/06/2015 9:30 AM Medical Record Number: [id number] Patient Account Number: [id number] Date of Birth/Sex: Nov 04, 1945 (70 y.o. Male) Treating RN: Montey Hora Primary Care Physician: [name] Other Clinician: Referring Physician: [name] Treating Physician/Extender: Frann Rider in Treatment: 1 Information Obtained from: Patient Chief Complaint Patient presents to the wound care center for a consult due non healing wound to the left lower extremity for the last 2 months. Electronic Signature(s) Signed: 10/06/2015 10:35:57 AM By: Christin Fudge MD, FACS Entered By: Christin Fudge on 10/06/2015 10:35:57 [name] ([id number]) -------------------------------------------------------------------------------- Debridement Details Patient Name: [name] Date of Service: 10/06/2015 9:30 AM Medical Record Number: [id number] Patient Account Number: [id number] Date of Birth/Sex: 19-Dec-1945 (70 y.o. Male) Treating RN: Montey Hora Primary Care Physician: [name] Other Clinician: Referring Physician: [name] Treating Physician/Extender: Frann Rider in Treatment: 1 Debridement Performed for Wound #1 Left,Midline,Anterior Lower Leg Assessment: Performed By: Physician Christin Fudge, MD Debridement: Debridement Pre-procedure Yes Verification/Time Out Taken: Start Time: 10:29 Pain Control: Lidocaine 4% Topical Solution Level: Skin/Subcutaneous Tissue Total Area Debrided (L x 2.5 (cm) x 1.3 (cm) = 3.25 (cm) W): Tissue and other Viable, Non-Viable, Eschar, Fibrin/Slough, Subcutaneous material debrided: Instrument: Curette Bleeding: Minimum Hemostasis Achieved: Pressure End Time: 10:31 Procedural Pain: 0 Post Procedural Pain: 0 Response to Treatment: Procedure was tolerated  well Post Debridement Measurements of Total Wound Length: (cm) 2.5 Width: (cm) 1.3 Depth: (cm) 0.6 Volume: (cm) 1.532 Post Procedure Diagnosis Same as Pre-procedure Electronic Signature(s) Signed: 10/06/2015 10:35:46 AM By: Christin Fudge MD, FACS Signed: 10/06/2015 5:02:46 PM By: Montey Hora Entered By: Christin Fudge on 10/06/2015 10:35:45 [name] ([id number]) -------------------------------------------------------------------------------- HPI Details Patient Name: [name] Date of Service: 10/06/2015 9:30 AM Medical Record Number: [id number] Patient Account Number: [id number] Date of Birth/Sex: 04-10-1946 (70 y.o. Male) Treating RN: Montey Hora Primary Care Physician: [name] Other Clinician: Referring Physician: [name] Treating Physician/Extender: Frann Rider in Treatment: 1 History of Present Illness Location: left lower leg anteriorly on his shin Quality: Patient reports experiencing a dull pain to affected area(s). Severity: Patient states wound are getting worse. Duration: Patient has had the wound for > 2 months prior to seeking treatment at the wound center Timing: Pain in wound is Intermittent (comes and goes Context: The wound occurred when the patient had a blunt injury with a golf cart 2 months ago Modifying Factors: Other treatment(s) tried include: he has been applying local ointments to this and once the scab came out he noticed a deep ulceration. Associated Signs and Symptoms: Patient reports having difficulty standing for long periods. HPI Description: 70 year old gentleman who was recently seen by his nephrologist [name], and noted to have a wound on his left lower extremity which was lacerated 2 months ago and now has reopened. The patient's left shin has a ulceration with some exudate but no evidence of infection and he was referred to [place] for further care as it was known that the patient has  had some peripheral vascular disease in the past. Past medical history significant for chronic kidney disease, atrial fibrillation, diabetes mellitus,status post kidney transplant in 1983 and 2005, a week fistula graft placement, status post previous bowel surgery. 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 10/06/2015 -- x-ray of the left tibia and fibula shows no evidence of osteomyelitis. The patient has also had Doppler  studies of his extremity and is awaiting the appointment with the vascular surgeon. We have not yet received these reports. Electronic Signature(s) Signed: 10/06/2015 10:36:41 AM By: Christin Fudge MD, FACS Previous Signature: 10/06/2015 8:57:20 AM Version By: Christin Fudge MD, FACS Entered By: Christin Fudge on 10/06/2015 10:36:40 [name], [name] ([id number]) -------------------------------------------------------------------------------- Physical Exam Details Patient Name: [name] Date of Service: 10/06/2015 9:30 AM Medical Record Number: [id number] Patient Account Number: [id number] Date of Birth/Sex: 1946-02-17 (70 y.o. Male) Treating RN: Montey Hora Primary Care Physician: [name] Other Clinician: Referring Physician: [name] Treating Physician/Extender: Frann Rider in Treatment: 1 Constitutional . Pulse regular. Respirations normal and unlabored. Afebrile. . Eyes Nonicteric. Reactive to light. Ears, Nose, Mouth, and Throat Lips, teeth, and gums WNL.Marland Kitchen Moist mucosa without lesions. Neck supple and nontender. No palpable supraclavicular or cervical adenopathy. Normal sized without goiter. Respiratory WNL. No retractions.. Cardiovascular Pedal Pulses WNL. No clubbing, cyanosis or edema. Lymphatic No adneopathy. No adenopathy. No adenopathy. Musculoskeletal Adexa without tenderness or enlargement.. Digits and nails w/o clubbing, cyanosis, infection, petechiae, ischemia, or inflammatory  conditions.. Integumentary (Hair, Skin) No suspicious lesions. No crepitus or fluctuance. No peri-wound warmth or erythema. No masses.Marland Kitchen Psychiatric Judgement and insight Intact.. No evidence of depression, anxiety, or agitation.. Notes He continues to have a lot of subcutaneous debris and some granulation tissue and has been sharply debrided with a curette and the bleeding was controlled with pressure Electronic Signature(s) Signed: 10/06/2015 10:37:37 AM By: Christin Fudge MD, FACS Entered By: Christin Fudge on 10/06/2015 10:37:36 [name] ([id number]) -------------------------------------------------------------------------------- Physician Orders Details Patient Name: [name] Date of Service: 10/06/2015 9:30 AM Medical Record Number: [id number] Patient Account Number: [id number] Date of Birth/Sex: [birth year] (70 y.o. Male) Treating RN: Montey Hora Primary Care Physician: [name] Other Clinician: Referring Physician: [name] Treating Physician/Extender: Frann Rider in Treatment: 1 Verbal / Phone Orders: Yes Clinician: Dorthy, Joanna Read Back and Verified: Yes Diagnosis Coding Wound Cleansing Wound #1 Left,Midline,Anterior Lower Leg o Clean wound with Normal Saline. Anesthetic Wound #1 Left,Midline,Anterior Lower Leg o Topical Lidocaine 4% cream applied to wound bed prior to debridement Primary Wound Dressing Wound #1 Left,Midline,Anterior Lower Leg o Santyl Ointment Secondary Dressing Wound #1 Left,Midline,Anterior Lower Leg o Gauze and Kerlix/Conform Dressing Change Frequency Wound #1 Left,Midline,Anterior Lower Leg o Change dressing every day. Follow-up Appointments Wound #1 Left,Midline,Anterior Lower Leg o Return Appointment in 1 week. Electronic Signature(s) Signed: 10/06/2015 4:49:11 PM By: Christin Fudge MD, FACS Signed: 10/06/2015 5:02:46 PM By: Montey Hora Entered By: Montey Hora on 10/06/2015  10:31:02 [name] ([id number]) -------------------------------------------------------------------------------- Problem List Details Patient Name: [name] Date of Service: 10/06/2015 9:30 AM Medical Record Number: [id number] Patient Account Number: [id number] Date of Birth/Sex: 1945/12/21 (70 y.o. 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 I73.9 Peripheral vascular disease, unspecified 09/29/2015 Yes I87.312 Chronic venous hypertension (idiopathic) with ulcer of left 09/29/2015 Yes lower extremity Z94.0 Kidney transplant status 09/29/2015 Yes Inactive Problems Resolved Problems Electronic Signature(s) Signed: 10/06/2015 10:35:35 AM By: Christin Fudge MD, FACS Entered By: Christin Fudge on 10/06/2015 10:35:35 [name] ([id number]) -------------------------------------------------------------------------------- Progress Note Details Patient Name: [name] Date of Service: 10/06/2015 9:30 AM Medical Record Number: [id number] Patient Account Number: [id number] Date of  Birth/Sex: Jun 24, 1946 (70 y.o. Male) Treating RN: Montey Hora Primary Care Physician: [name] Other Clinician: Referring Physician: [name] Treating Physician/Extender: Frann Rider in Treatment: 1 Subjective Chief Complaint Information obtained from Patient Patient presents to the wound care center for a consult due non healing wound to the left lower extremity for the last 2 months. History of Present Illness (HPI) The following HPI elements were documented for the patient's  wound: Location: left lower leg anteriorly on his shin Quality: Patient reports experiencing a dull pain to affected area(s). Severity: Patient states wound are getting worse. Duration: Patient has had the wound for > 2 months prior to seeking treatment at the wound center Timing: Pain in wound is Intermittent (comes and goes Context: The wound occurred when the patient had a blunt injury with a golf cart 2 months ago Modifying Factors: Other treatment(s) tried include: he has been applying local ointments to this and once the scab came out he noticed a deep ulceration. Associated Signs and Symptoms: Patient reports having difficulty standing for long periods. 70 year old gentleman who was recently seen by his nephrologist [name], and noted to have a wound on his left lower extremity which was lacerated 2 months ago and now has reopened. The patient's left shin has a ulceration with some exudate but no evidence of infection and he was referred to [place] for further care as it was known that the patient has had some peripheral vascular disease in the past. Past medical history significant for chronic kidney disease, atrial fibrillation, diabetes mellitus,status post kidney transplant in 1983 and 2005, a week fistula graft placement, status post previous bowel surgery. 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 10/06/2015 -- x-ray of the left tibia and fibula shows no evidence of osteomyelitis. The patient has also had Doppler studies of his extremity and is awaiting the appointment with the vascular surgeon. We have not yet received these reports. Objective Constitutional Pulse regular. Respirations normal and unlabored. Afebrile. [name], [name] ([id number]) Vitals Time Taken: 9:44 AM, Height: 68 in, Weight: 178 lbs, BMI: 27.1, Temperature: 98.2 F, Pulse: 58 bpm, Respiratory Rate: 18 breaths/min, Blood Pressure: 143/86 mmHg. Eyes Nonicteric.  Reactive to light. Ears, Nose, Mouth, and Throat Lips, teeth, and gums WNL.Marland Kitchen Moist mucosa without lesions. Neck supple and nontender. No palpable supraclavicular or cervical adenopathy. Normal sized without goiter. Respiratory WNL. No retractions.. Cardiovascular Pedal Pulses WNL. No clubbing, cyanosis or edema. Lymphatic No adneopathy. No adenopathy. No adenopathy. Musculoskeletal Adexa without tenderness or enlargement.. Digits and nails w/o clubbing, cyanosis, infection, petechiae, ischemia, or inflammatory conditions.Marland Kitchen Psychiatric Judgement and insight Intact.. No evidence of depression, anxiety, or agitation.. General Notes: He continues to have a lot of subcutaneous debris and some granulation tissue and has been sharply debrided with a curette and the bleeding was controlled with pressure Integumentary (Hair, Skin) No suspicious lesions. No crepitus or fluctuance. No peri-wound warmth or erythema. No masses.. Wound #1 status is Open. Original cause of wound was Trauma. The wound is located on the Left,Midline,Anterior Lower Leg. The wound measures 2.5cm length x 1.3cm width x 0.4cm depth; 2.553cm^2 area and 1.021cm^3 volume. The wound is limited to skin breakdown. There is no tunneling or undermining noted. There is a medium amount of serosanguineous drainage noted. The wound margin is flat and intact. There is small (1-33%) red, pink granulation within the wound bed. There is a large (67- 100%) amount of necrotic tissue within the  wound bed including Eschar and Adherent Slough. The periwound skin appearance exhibited: Localized Edema, Moist, Erythema. The surrounding wound skin color is noted with erythema which is circumferential. Periwound temperature was noted as No Abnormality. [name], [name] ([id number]) Assessment Active Problems ICD-10 E11.622 - Type 2 diabetes mellitus with other skin ulcer L97.222 - Non-pressure chronic ulcer of left calf with fat layer  exposed I73.9 - Peripheral vascular disease, unspecified I87.312 - Chronic venous hypertension (idiopathic) with ulcer of left lower extremity Z94.0 - Kidney transplant status Procedures Wound #1 Wound #1 is a Trauma, Other located on the Left,Midline,Anterior Lower Leg . There was a Skin/Subcutaneous Tissue Debridement [id number]) debridement with total area of 3.25 sq cm performed by Christin Fudge, MD. with the following instrument(s): Curette to remove Viable and Non-Viable tissue/material including Fibrin/Slough, Eschar, and Subcutaneous after achieving pain control using Lidocaine 4% Topical Solution. A time out was conducted prior to the start of the procedure. A Minimum amount of bleeding was controlled with Pressure. The procedure was tolerated well with a pain level of 0 throughout and a pain level of 0 following the procedure. Post Debridement Measurements: 2.5cm length x 1.3cm width x 0.6cm depth; 1.532cm^3 volume. Post procedure Diagnosis Wound #1: Same as Pre-Procedure Plan Wound Cleansing: Wound #1 Left,Midline,Anterior Lower Leg: Clean wound with Normal Saline. Anesthetic: Wound #1 Left,Midline,Anterior Lower Leg: Topical Lidocaine 4% cream applied to wound bed prior to debridement Primary Wound Dressing: Wound #1 Left,Midline,Anterior Lower Leg: Santyl Ointment Secondary Dressing: Wound #1 Left,Midline,Anterior Lower Leg: Gauze and Kerlix/Conform Dressing Change FrequencyJUANYA, [name] ([id number]) Wound #1 Left,Midline,Anterior Lower Leg: Change dressing every day. Follow-up Appointments: Wound #1 Left,Midline,Anterior Lower Leg: Return Appointment in 1 week. He will also need Santyl ointment locally on a daily basis and will need repeated debridement of this wound and I was asked him to come back and see me every week. An x-ray of the left lower extremity showed no bone involvement this report has been discussed with him. We are awaiting his vascular  reports and hopefully will get these by next week. Details discussed with him and he is agreeable and will be compliant. Electronic Signature(s) Signed: 10/06/2015 10:39:22 AM By: Christin Fudge MD, FACS Entered By: Christin Fudge on 10/06/2015 10:39:22 [name], [name] ([id number]) -------------------------------------------------------------------------------- SuperBill Details Patient Name: [name] Date of Service: 10/06/2015 Medical Record Number: [id number] Patient Account Number: [id number] Date of Birth/Sex: 18-Apr-1946 (69 y.o. 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) with ulcer of left lower extremity Z94.0 Kidney transplant status Facility Procedures CPT4 Code Description: [id number] - DEB SUBQ TISSUE 20 SQ CM/< ICD-10 Description Diagnosis E11.622 Type 2 diabetes mellitus with other skin 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 I73.9 Peripheral vascular disease,  unspecified I87.312 Chronic venous hypertension (idiopathic) with ulcer Modifier: er exposed of left lower Quantity: 1 extremity Physician Procedures CPT4 Code Description: [id number] - WC PHYS LEVEL 3 - EST PT ICD-10 Description Diagnosis E11.622 Type 2 diabetes mellitus with other skin 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 I73.9 Peripheral vascular disease,  unspecified I87.312 Chronic venous hypertension (idiopathic) with ulcer Modifier: 25 er exposed of left lower Quantity: 1 extremity  CPT4 Code Description: [id number] -  WC PHYS SUBQ TISS 20 SQ CM ICD-10 Description Diagnosis E11.622 Type 2 diabetes mellitus with other 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

## 2015-10-07 ENCOUNTER — Encounter: Payer: Self-pay | Admitting: Vascular Surgery

## 2015-10-07 NOTE — Progress Notes (Signed)
[name], [name] ([id number]) Visit Report for 10/06/2015 Arrival Information Details Patient Name: [name], [name] Date of Service: 10/06/2015 9:30 AM Medical Record Number: [id number] Patient Account Number: [id number] Date of Birth/Sex: 1945-12-31 (70 y.o. Male) Treating RN: Montey Hora Primary Care Physician: [name] Other Clinician: Referring Physician: [name] Treating Physician/Extender: Frann Rider in Treatment: 1 Visit Information History Since Last Visit Added or deleted any medications: No Patient Arrived: Ambulatory Any new allergies or adverse reactions: No Arrival Time: 09:43 Had a fall or experienced change in No Accompanied By: self activities of daily living that may affect Transfer Assistance: None risk of falls: Patient Identification Verified: Yes Signs or symptoms of abuse/neglect since last No Secondary Verification Process Yes visito Completed: Hospitalized since last visit: No Patient Requires Transmission- No Pain Present Now: No Based Precautions: Patient Has Alerts: Yes Patient Alerts: DM II ASA non-compressible 09/29/15 Electronic Signature(s) Signed: 10/06/2015 5:02:46 PM By: Montey Hora Entered By: Montey Hora on 10/06/2015 09:43:49 [name] ([id number]) -------------------------------------------------------------------------------- Encounter Discharge Information Details Patient Name: [name] Date of Service: 10/06/2015 9:30 AM Medical Record Number: [id number] Patient Account Number: [id number] Date of Birth/Sex: 1946-05-22 (70 y.o. Male) Treating RN: Montey Hora Primary Care Physician: [name] Other Clinician: Referring Physician: [name] Treating Physician/Extender: Frann Rider in Treatment: 1 Encounter Discharge Information Items Discharge Pain Level: 0 Discharge Condition: Stable Ambulatory Status: Ambulatory Discharge Destination:  Home Transportation: Private Auto Accompanied By: self Schedule Follow-up Appointment: Yes Medication Reconciliation completed and provided to Patient/Care No [name]: Provided on Clinical Summary of Care: 10/06/2015 Form Type Recipient Paper Patient AR Electronic Signature(s) Signed: 10/06/2015 4:42:44 PM By: Montey Hora Previous Signature: 10/06/2015 10:41:24 AM Version By: Ruthine Dose Entered By: Montey Hora on 10/06/2015 16:42:44 [name] ([id number]) -------------------------------------------------------------------------------- Lower Extremity Assessment Details Patient Name: [name] Date of Service: 10/06/2015 9:30 AM Medical Record Number: [id number] Patient Account Number: [id number] Date of Birth/Sex: Feb 24, 1946 (70 y.o. Male) Treating RN: Montey Hora Primary Care Physician: [name] Other Clinician: Referring Physician: [name] Treating Physician/Extender: Frann Rider in Treatment: 1 Edema Assessment Assessed: [Left: No] [Right: No] Edema: [Left: Ye] [Right: s] Calf Left: Right: Point of Measurement: 38 cm From Medial Instep 37.2 cm cm Ankle Left: Right: Point of Measurement: 12 cm From Medial Instep 21 cm cm Vascular Assessment Pulses: Posterior Tibial Dorsalis Pedis Palpable: [Left:Yes] Extremity colors, hair growth, and conditions: Extremity Color: [Left:Hyperpigmented] Hair Growth on Extremity: [Left:No] Temperature of Extremity: [Left:Warm] Capillary Refill: [Left:< 3 seconds] Electronic Signature(s) Signed: 10/06/2015 5:02:46 PM By: Montey Hora Entered By: Montey Hora on 10/06/2015 09:51:20 [name] ([id number]) -------------------------------------------------------------------------------- Multi Wound Chart Details Patient Name: [name] Date of Service: 10/06/2015 9:30 AM Medical Record Number: [id number] Patient Account Number: [id number] Date of Birth/Sex: July 08, 1946  (70 y.o. Male) Treating RN: Montey Hora Primary Care Physician: [name] Other Clinician: Referring Physician: [name] Treating Physician/Extender: Frann Rider in Treatment: 1 Vital Signs Height(in): 68 Pulse(bpm): 58 Weight(lbs): 178 Blood Pressure 143/86 (mmHg): Body Mass Index(BMI): 27 Temperature(F): 98.2 Respiratory Rate 18 (breaths/min): Photos: [1:No Photos] [N/A:N/A] Wound Location: [1:Left Lower Leg - Midline, N/A Anterior] Wounding Event: [1:Trauma] [N/A:N/A] Primary Etiology: [1:Trauma, Other] [N/A:N/A] Comorbid History: [1:Cataracts, Glaucoma, Anemia, Arrhythmia, Type II Diabetes, Osteoarthritis, Neuropathy] [N/A:N/A] Date Acquired: [1:07/30/2015] [N/A:N/A] Weeks of Treatment: [1:1] [N/A:N/A] Wound Status: [1:Open] [N/A:N/A] Measurements L x W x D 2.5x1.3x0.4 [N/A:N/A] (cm) Area (cm) : [1:2.553] [N/A:N/A] Volume (cm) : [1:1.021] [N/A:N/A] % Reduction in Area: [1:18.70%] [N/A:N/A] % Reduction in Volume: -8.40% [N/A:N/A] Classification: [  1:Full Thickness Without Exposed Support Structures] [N/A:N/A] HBO Classification: [1:Grade 2] [N/A:N/A] Exudate Amount: [1:Medium] [N/A:N/A] Exudate Type: [1:Serosanguineous] [N/A:N/A] Exudate Color: [1:red, brown] [N/A:N/A] Wound Margin: [1:Flat and Intact] [N/A:N/A] Granulation Amount: [1:Small (1-33%)] [N/A:N/A] Granulation Quality: [1:Red, Pink] [N/A:N/A] Necrotic Amount: [1:Large (67-100%)] [N/A:N/A] Necrotic Tissue: [1:Eschar, Adherent Slough N/A] Exposed Structures: Fascia: No N/A N/A Fat: No Tendon: No Muscle: No Joint: No Bone: No Limited to Skin Breakdown Epithelialization: None N/A N/A Periwound Skin Texture: Edema: Yes N/A N/A Periwound Skin Moist: Yes N/A N/A Moisture: Periwound Skin Color: Erythema: Yes N/A N/A Erythema Location: Circumferential N/A N/A Temperature: No Abnormality N/A N/A Tenderness on No N/A N/A Palpation: Wound Preparation: Ulcer Cleansing:  N/A N/A Rinsed/Irrigated with Saline Topical Anesthetic Applied: Other: lidocaine 4% Treatment Notes Electronic Signature(s) Signed: 10/06/2015 5:02:46 PM By: Montey Hora Entered By: Montey Hora on 10/06/2015 09:51:56 [name] ([id number]) -------------------------------------------------------------------------------- Multi-Disciplinary Care Plan Details Patient Name: [name] Date of Service: 10/06/2015 9:30 AM Medical Record Number: [id number] Patient Account Number: [id number] Date of Birth/Sex: 12-06-1945 (70 y.o. Male) Treating RN: Montey Hora Primary Care Physician: [name] Other Clinician: Referring Physician: [name] Treating Physician/Extender: Frann Rider in Treatment: 1 Active Inactive Abuse / Safety / Falls / Self Care Management Nursing Diagnoses: Potential for falls Goals: Patient will remain injury free Date Initiated: 09/29/2015 Goal Status: Active Interventions: Assess fall risk on admission and as needed Notes: Nutrition Nursing Diagnoses: Imbalanced nutrition Goals: Patient/caregiver verbalizes understanding of need to maintain therapeutic glucose control per primary care physician Date Initiated: 09/29/2015 Goal Status: Active Patient/caregiver will maintain therapeutic glucose control Date Initiated: 09/29/2015 Goal Status: Active Interventions: Assess patient nutrition upon admission and as needed per policy Provide education on elevated blood sugars and impact on wound healing Notes: Orientation to the Wound Care Program Nursing Diagnoses: [name], [name] ([id number]) Knowledge deficit related to the wound healing center program Goals: Patient/caregiver will verbalize understanding of the [facility] Date Initiated: 09/29/2015 Goal Status: Active Interventions: Provide education on orientation to the wound center Notes: Pain, Acute or Chronic Nursing Diagnoses: Pain, acute  or chronic: actual or potential Potential alteration in comfort, pain Goals: Patient will verbalize adequate pain control and receive pain control interventions during procedures as needed Date Initiated: 09/29/2015 Goal Status: Active Interventions: Assess comfort goal upon admission Complete pain assessment as per visit requirements Notes: Wound/Skin Impairment Nursing Diagnoses: Impaired tissue integrity Goals: Ulcer/skin breakdown will have a volume reduction of 30% by week 4 Date Initiated: 09/29/2015 Goal Status: Active Ulcer/skin breakdown will have a volume reduction of 50% by week 8 Date Initiated: 09/29/2015 Goal Status: Active Ulcer/skin breakdown will have a volume reduction of 80% by week 12 Date Initiated: 09/29/2015 Goal Status: Active Interventions: [name], [name] ([id number]) Assess patient/caregiver ability to obtain necessary supplies Assess patient/caregiver ability to perform ulcer/skin care regimen upon admission and as needed Notes: Electronic Signature(s) Signed: 10/06/2015 5:02:46 PM By: Montey Hora Entered By: Montey Hora on 10/06/2015 09:51:50 [name] ([id number]) -------------------------------------------------------------------------------- Patient/Caregiver Education Details Patient Name: [name] Date of Service: 10/06/2015 9:30 AM Medical Record Number: [id number] Patient Account Number: [id number] Date of Birth/Gender: 1946/05/03 (70 y.o. Male) Treating RN: Montey Hora Primary Care Physician: [name] Other Clinician: Referring Physician: [name] Treating Physician/Extender: Frann Rider in Treatment: 1 Education Assessment Education Provided To: Patient Education Topics Provided Wound/Skin Impairment: Handouts: Other: wound care as ordered Methods: Demonstration, Explain/Verbal Responses: State content correctly Electronic Signature(s) Signed: 10/06/2015 4:42:58 PM By: Montey Hora Entered By: Montey Hora  on 10/06/2015 16:42:58 [name], [name] ([id number]) -------------------------------------------------------------------------------- Wound Assessment Details Patient Name: [name], [name] Date of Service: 10/06/2015 9:30 AM Medical Record Number: [id number] Patient Account Number: [id number] Date of Birth/Sex: 1946-09-18 (70 y.o. Male) Treating RN: Montey Hora Primary Care Physician: [name] Other Clinician: Referring Physician: [name] Treating Physician/Extender: Frann Rider in Treatment: 1 Wound Status Wound Number: 1 Primary Trauma, Other Etiology: Wound Location: Left Lower Leg - Midline, Anterior Wound Open Status: Wounding Event: Trauma Comorbid Cataracts, Glaucoma, Anemia, Date Acquired: 07/30/2015 History: Arrhythmia, Type II Diabetes, Weeks Of Treatment: 1 Osteoarthritis, Neuropathy Clustered Wound: No Photos Photo Uploaded By: Montey Hora on 10/06/2015 16:41:09 Wound Measurements Length: (cm) 2.5 Width: (cm) 1.3 Depth: (cm) 0.4 Area: (cm) 2.553 Volume: (cm) 1.021 % Reduction in Area: 18.7% % Reduction in Volume: -8.4% Epithelialization: None Tunneling: No Undermining: No Wound Description Full Thickness Without Exposed Foul Odor Af Classification: Support Structures Diabetic Severity Grade 2 (Wagner): Wound Margin: Flat and Intact Exudate Amount: Medium Exudate Type: Serosanguineous Exudate Color: red, brown ter Cleansing: No Wound Bed Granulation Amount: Small (1-33%) Exposed Structure [name], [name] ([id number]) Granulation Quality: Red, Pink Fascia Exposed: No Necrotic Amount: Large (67-100%) Fat Layer Exposed: No Necrotic Quality: Eschar, Adherent Slough Tendon Exposed: No Muscle Exposed: No Joint Exposed: No Bone Exposed: No Limited to Skin Breakdown Periwound Skin Texture Texture Color No Abnormalities Noted: No No Abnormalities Noted: No Localized Edema:  Yes Erythema: Yes Erythema Location: Circumferential Moisture No Abnormalities Noted: No Temperature / Pain Moist: Yes Temperature: No Abnormality Wound Preparation Ulcer Cleansing: Rinsed/Irrigated with Saline Topical Anesthetic Applied: Other: lidocaine 4%, Treatment Notes Wound #1 (Left, Midline, Anterior Lower Leg) 1. Cleansed with: Clean wound with Normal Saline 2. Anesthetic Topical Lidocaine 4% cream to wound bed prior to debridement 3. Peri-wound Care: Skin Prep 4. Dressing Applied: Santyl Ointment 5. Secondary Dressing Applied Bordered Foam Dressing Dry Gauze Electronic Signature(s) Signed: 10/06/2015 5:02:46 PM By: Montey Hora Entered By: Montey Hora on 10/06/2015 09:50:17 [name] ([id number]) -------------------------------------------------------------------------------- Vitals Details Patient Name: [name] Date of Service: 10/06/2015 9:30 AM Medical Record Number: [id number] Patient Account Number: [id number] Date of Birth/Sex: 04-02-1946 (70 y.o. Male) Treating RN: Montey Hora Primary Care Physician: [name] Other Clinician: Referring Physician: [name] Treating Physician/Extender: Frann Rider in Treatment: 1 Vital Signs Time Taken: 09:44 Temperature (F): 98.2 Height (in): 68 Pulse (bpm): 58 Weight (lbs): 178 Respiratory Rate (breaths/min): 18 Body Mass Index (BMI): 27.1 Blood Pressure (mmHg): 143/86 Reference Range: 80 - 120 mg / dl Electronic Signature(s) Signed: 10/06/2015 5:02:46 PM By: Montey Hora Entered By: Montey Hora on 10/06/2015 09:44:53

## 2015-10-13 ENCOUNTER — Encounter: Payer: Medicare Other | Admitting: Surgery

## 2015-10-13 DIAGNOSIS — E1122 Type 2 diabetes mellitus with diabetic chronic kidney disease: Secondary | ICD-10-CM | POA: Diagnosis not present

## 2015-10-13 DIAGNOSIS — E11622 Type 2 diabetes mellitus with other skin ulcer: Secondary | ICD-10-CM | POA: Diagnosis not present

## 2015-10-13 DIAGNOSIS — L97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er exposed: Secondary | ICD-10-CM | POA: Diagnosis not present

## 2015-10-13 DIAGNOSIS — Z940 Kidney transplant status: Secondary | ICD-10-CM | POA: Diagnosis not present

## 2015-10-13 DIAGNOSIS — I739 Peripheral vascular disease, unspecified: Secondary | ICD-10-CM | POA: Diagnosis not present

## 2015-10-13 DIAGNOSIS — I87312 Chronic venous hypertension (idiopathic) with ulcer of left lower extremity: Secondary | ICD-10-CM | POA: Diagnosis not present

## 2015-10-13 NOTE — Progress Notes (Addendum)
MAKYAH, SPRENGER ([id number]) Visit Report for 10/13/2015 Chief Complaint Document Details Patient Name: [name], [name] Date of Service: 10/13/2015 8:45 AM Medical Record Number: [id number] Patient Account Number: [id number] Date of Birth/Sex: May 03, 1946 (70 y.o. Male) Treating RN: Macarthur Critchley Primary Care Physician: [name] Other Clinician: Referring Physician: [name] Treating Physician/Extender: Frann Rider in Treatment: 2 Information Obtained from: Patient Chief Complaint Patient presents to the wound care center for a consult due non healing wound to the left lower extremity for the last 2 months. Electronic Signature(s) Signed: 10/13/2015 9:58:34 AM By: Christin Fudge MD, FACS Entered By: Christin Fudge on 10/13/2015 09:58:34 WILMORE, NETHERY ([id number]) -------------------------------------------------------------------------------- Debridement Details Patient Name: [name] Date of Service: 10/13/2015 8:45 AM Medical Record Number: [id number] Patient Account Number: [id number] Date of Birth/Sex: [birth year] (70 y.o. Male) Treating RN: Macarthur Critchley Primary Care Physician: [name] Other Clinician: Referring Physician: [name] Treating Physician/Extender: Frann Rider in Treatment: 2 Debridement Performed for Wound #1 Left,Midline,Anterior Lower Leg Assessment: Performed By: Physician Christin Fudge, MD Debridement: Debridement Pre-procedure Yes Verification/Time Out Taken: Start Time: 09:18 Pain Control: Lidocaine 4% Topical Solution Level: Skin/Subcutaneous Tissue Total Area Debrided (L x 2.5 (cm) x 1.3 (cm) = 3.25 (cm) W): Tissue and other Viable, Non-Viable, Eschar, Fibrin/Slough, Subcutaneous material debrided: Instrument: Curette Bleeding: Minimum End Time: 09:19 Procedural Pain: 0 Post Procedural Pain: 0 Response to Treatment: Procedure was tolerated well Post Debridement Measurements  of Total Wound Length: (cm) 2.5 Width: (cm) 1.3 Depth: (cm) 0.5 Volume: (cm) 1.276 Post Procedure Diagnosis Same as Pre-procedure Electronic Signature(s) Signed: 10/13/2015 10:06:12 AM By: Christin Fudge MD, FACS Signed: 10/13/2015 3:45:43 PM By: Rebecca Eaton RN, Sendra Previous Signature: 10/13/2015 10:05:26 AM Version By: Christin Fudge MD, FACS Previous Signature: 10/13/2015 9:58:29 AM Version By: Christin Fudge MD, FACS Entered By: Christin Fudge on 10/13/2015 [place], [place] ([id number]) -------------------------------------------------------------------------------- HPI Details Patient Name: [name] Date of Service: 10/13/2015 8:45 AM Medical Record Number: [id number] Patient Account Number: [id number] Date of Birth/Sex: 02/15/1946 (70 y.o. Male) Treating RN: Macarthur Critchley Primary Care Physician: [name] Other Clinician: Referring Physician: [name] Treating Physician/Extender: Frann Rider in Treatment: 2 History of Present Illness Location: left lower leg anteriorly on his shin Quality: Patient reports experiencing a dull pain to affected area(s). Severity: Patient states wound are getting worse. Duration: Patient has had the wound for > 2 months prior to seeking treatment at the wound center Timing: Pain in wound is Intermittent (comes and goes Context: The wound occurred when the patient had a blunt injury with a golf cart 2 months ago Modifying Factors: Other treatment(s) tried include: he has been applying local ointments to this and once the scab came out he noticed a deep ulceration. Associated Signs and Symptoms: Patient reports having difficulty standing for long periods. HPI Description: 70 year old gentleman who was recently seen by his nephrologist [name], and noted to have a wound on his left lower extremity which was lacerated 2 months ago and now has reopened. The patient's left shin has a ulceration  with some exudate but no evidence of infection and he was referred to [place] for further care as it was known that the patient has had some peripheral vascular disease in the past. Past medical history significant for chronic kidney disease, atrial fibrillation, diabetes mellitus,status post kidney transplant in 1983 and 2005, a week fistula graft placement, status post previous bowel surgery. 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  10/06/2015 -- x-ray of the left tibia and fibula shows no evidence of osteomyelitis. The patient has also had Doppler studies of his extremity and is awaiting the appointment with the vascular surgeon. We have not yet received these reports. 10/13/2015 -- lower extremity venous duplex reflux evaluation shows reflux in the left common femoral vein, left saphenofemoral junction and the proximal greater saphenous vein extending to the proximal calf. There is also reflux in the left proximal to mid small saphenous vein. Arterial duplex studies done showed the resting ABI was not applicable due to tibial artery medial calcification. 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 The right TBI was 1 which is normal and the left ABI was 1 which is normal. Patient has otherwise been doing fine and has been compliant with his dressings. Electronic Signature(s) Signed: 10/13/2015 10:00:51 AM By: Christin Fudge MD, FACS Entered By: Christin Fudge on 10/13/2015 10:00:51 VADA, LINHARDT ([id number]) -------------------------------------------------------------------------------- Physical Exam Details Patient Name: [name] Date of Service: 10/13/2015 8:45 AM Medical Record Number: [id number] Patient Account Number: [id number] Date of Birth/Sex: Feb 18, 1946 (70 y.o. Male) Treating RN: Macarthur Critchley Primary Care  Physician: [name] Other Clinician: Referring Physician: [name] Treating Physician/Extender: Frann Rider in Treatment: 2 Constitutional . Pulse regular. Respirations normal and unlabored. Afebrile. . Eyes Nonicteric. Reactive to light. Ears, Nose, Mouth, and Throat Lips, teeth, and gums WNL.Marland Kitchen Moist mucosa without lesions. Neck supple and nontender. No palpable supraclavicular or cervical adenopathy. Normal sized without goiter. Respiratory WNL. No retractions.. Cardiovascular Pedal Pulses WNL. No clubbing, cyanosis or edema. Chest Breasts symmetical and no nipple discharge.. Breast tissue WNL, no masses, lumps, or tenderness.. Lymphatic No adneopathy. No adenopathy. No adenopathy. Musculoskeletal Adexa without tenderness or enlargement.. Digits and nails w/o clubbing, cyanosis, infection, petechiae, ischemia, or inflammatory conditions.. Integumentary (Hair, Skin) No suspicious lesions. No crepitus or fluctuance. No peri-wound warmth or erythema. No masses.Marland Kitchen Psychiatric Judgement and insight Intact.. No evidence of depression, anxiety, or agitation.. Notes he continues to have some subcutaneous debris and some granulation tissue which has sharply been debrided with a curette down to the subcutis tissue and bleeding is being controlled with pressure. Electronic Signature(s) Signed: 10/13/2015 10:02:57 AM By: Christin Fudge MD, FACS Entered By: Christin Fudge on 10/13/2015 10:02:57 CLAYBON, KOCIK ([id number]) -------------------------------------------------------------------------------- Physician Orders Details Patient Name: [name] Date of Service: 10/13/2015 8:45 AM Medical Record Number: [id number] Patient Account Number: [id number] Date of Birth/Sex: 05/27/1946 (70 y.o. Male) Treating RN: Macarthur Critchley Primary Care Physician: [name] Other Clinician: Referring Physician: [name] Treating Physician/Extender:  Frann Rider in Treatment: 2 Verbal / Phone Orders: Yes Clinician: Macarthur Critchley Read Back and Verified: No Diagnosis Coding Wound Cleansing Wound #1 Left,Midline,Anterior Lower Leg o Clean wound with Normal Saline. Anesthetic Wound #1 Left,Midline,Anterior Lower Leg o Topical Lidocaine 4% cream applied to wound bed prior to debridement Primary Wound Dressing Wound #1 Left,Midline,Anterior Lower Leg o Aquacel Ag Secondary Dressing Wound #1 Left,Midline,Anterior Lower Leg o Dry Gauze Dressing Change Frequency Wound #1 Left,Midline,Anterior Lower Leg o Change dressing every week Follow-up Appointments Wound #1 Left,Midline,Anterior Lower Leg o Return Appointment in 1 week. Edema Control Wound #1 Left,Midline,Anterior Lower Leg o 2 Layer Compression System - Left Lower Extremity Electronic Signature(s) Signed: 10/13/2015 3:45:43 PM By: Ardean Larsen Signed: 10/13/2015 4:13:39 PM By: Christin Fudge MD, FACS Entered By: Rebecca Eaton RN, Sendra on 10/13/2015 09:22:12 [name] ([id number])  JERROLD, WHITMARSH ([id number]) -------------------------------------------------------------------------------- Problem List Details Patient Name: LUISALBERTO, HEIMBERGER Date of Service: 10/13/2015 8:45 AM Medical Record Number: [id number] Patient Account Number: [id number] Date of Birth/Sex: [birth year] (70 y.o. 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/29/2015 Yes exposed I73.9 Peripheral vascular disease, unspecified 09/29/2015 Yes I87.312 Chronic venous hypertension (idiopathic) with ulcer of left 09/29/2015  Yes lower extremity Z94.0 Kidney transplant status 09/29/2015 Yes Inactive Problems Resolved Problems Electronic Signature(s) Signed: 10/13/2015 9:58:22 AM By: Christin Fudge MD, FACS Entered By: Christin Fudge on 10/13/2015 09:58:22 [name] ([id number]) -------------------------------------------------------------------------------- Progress Note Details Patient Name: [name] Date of Service: 10/13/2015 8:45 AM Medical Record Number: [id number] Patient Account Number: [id number] Date of Birth/Sex: 13-Aug-1946 (70 y.o. Male) Treating RN: Macarthur Critchley Primary Care Physician: [name] Other Clinician: Referring Physician: [name] Treating Physician/Extender: Frann Rider in Treatment: 2 Subjective Chief Complaint Information obtained from Patient Patient presents to the wound care center for a consult due non healing wound to the left lower extremity for the last 2 months. History of Present Illness (HPI) The following HPI elements were documented for the patient's wound: Location: left lower leg anteriorly on his shin Quality: Patient reports experiencing a dull pain to affected area(s). Severity: Patient states wound are getting worse. Duration: Patient has had the wound for > 2 months prior to seeking treatment at the wound center Timing: Pain in wound is Intermittent (comes and goes Context: The wound occurred when the patient had a blunt injury with a golf cart 2 months ago Modifying Factors: Other treatment(s) tried include: he has been applying local ointments to this and once the scab came out he noticed a deep ulceration. Associated Signs and Symptoms: Patient reports having difficulty standing for long periods. 70 year old gentleman who was recently seen by his nephrologist [name], and noted to have a wound on his left lower extremity which was lacerated 2 months ago and now has reopened. The patient's left shin  has a ulceration with some exudate but no evidence of infection and he was referred to [place] for further care as it was known that the patient has had some peripheral vascular disease in the past. Past medical history significant for chronic kidney disease, atrial fibrillation, diabetes mellitus,status post kidney transplant in 1983 and 2005, a week fistula graft placement, status post previous bowel surgery. 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 10/06/2015 -- x-ray of the left tibia and fibula shows no evidence of osteomyelitis. The patient has also had Doppler studies of his extremity and is awaiting the appointment with the vascular surgeon. We have not yet received these reports. 10/13/2015 -- lower extremity venous duplex reflux evaluation shows reflux in the left common femoral vein, left saphenofemoral junction and the proximal greater saphenous vein extending to the proximal calf. There is also reflux in the left proximal to mid small saphenous vein. Arterial duplex studies done showed the resting ABI was not applicable due to tibial artery medial calcification. 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 The right TBI was 1 which is normal and  the left ABI was 1 which is normal. Patient has otherwise been doing fine and has been compliant with his dressings. ROMANCE, ARQUETTE ([id number]) Objective Constitutional Pulse regular. Respirations normal and unlabored. Afebrile. Vitals Time Taken: 8:51 AM, Height: 68 in, Weight: 178 lbs, BMI: 27.1, Temperature: 98.1 F, Pulse: 80 bpm, Blood Pressure: 160/116 mmHg. Eyes Nonicteric. Reactive to light. Ears, Nose, Mouth, and Throat Lips, teeth, and gums WNL.Marland Kitchen Moist mucosa without lesions. Neck supple and nontender. No palpable supraclavicular or cervical adenopathy. Normal sized without  goiter. Respiratory WNL. No retractions.. Cardiovascular Pedal Pulses WNL. No clubbing, cyanosis or edema. Chest Breasts symmetical and no nipple discharge.. Breast tissue WNL, no masses, lumps, or tenderness.. Lymphatic No adneopathy. No adenopathy. No adenopathy. Musculoskeletal Adexa without tenderness or enlargement.. Digits and nails w/o clubbing, cyanosis, infection, petechiae, ischemia, or inflammatory conditions.Marland Kitchen Psychiatric Judgement and insight Intact.. No evidence of depression, anxiety, or agitation.. General Notes: he continues to have some subcutaneous debris and some granulation tissue which has sharply been debrided with a curette down to the subcutis tissue and bleeding is being controlled with pressure. Integumentary (Hair, Skin) No suspicious lesions. No crepitus or fluctuance. No peri-wound warmth or erythema. No masses.. Wound #1 status is Open. Original cause of wound was Trauma. The wound is located on the Left,Midline,Anterior Lower Leg. The wound measures 2.5cm length x 1.3cm width x 0.4cm depth; [place], Tywon ([id number]) 2.553cm^2 area and 1.021cm^3 volume. The wound is limited to skin breakdown. There is no tunneling or undermining noted. There is a medium amount of serosanguineous drainage noted. The wound margin is flat and intact. There is medium (34-66%) red, pink granulation within the wound bed. There is a medium (34-66%) amount of necrotic tissue within the wound bed including Adherent Slough. The periwound skin appearance exhibited: Localized Edema. The periwound skin appearance did not exhibit: Moist, Erythema. Periwound temperature was noted as No Abnormality. Assessment Active Problems ICD-10 E11.622 - Type 2 diabetes mellitus with other skin ulcer L97.222 - Non-pressure chronic ulcer of left calf with fat layer exposed I73.9 - Peripheral vascular disease, unspecified I87.312 - Chronic venous hypertension (idiopathic) with ulcer of left  lower extremity Z94.0 - Kidney transplant status Now that is vascular reports are back and we know he has good arterial flow to the left lower extremity but has significant venous reflux and chronic venous hypertension I have recommended a review by the vascular surgeons to see if he recommends endovenous ablation. I have recommended Aquacel Ag to the ulcer base and a 2 layer compression with Kerlix and Coban. We will keep this on for the week. All signs of vascular compromise discussed with the patient and he will let us know immediately if he has problems. We'll see him back next week. Procedures Wound #1 Wound #1 is a Trauma, Other located on the Left,Midline,Anterior Lower Leg . There was a Skin/Subcutaneous Tissue Debridement [id number]) debridement with total area of 3.25 sq cm performed by Christin Fudge, MD. with the following instrument(s): Curette to remove Viable and Non-Viable tissue/material including Fibrin/Slough, Eschar, and Subcutaneous after achieving pain control using Lidocaine 4% Topical Solution. A time out was conducted prior to the start of the procedure. A Minimum amount of bleeding was controlled with N/A. The procedure was tolerated well with a pain level of 0 throughout and a pain level of 0 following the procedure. Post Debridement Measurements: 2.5cm length x 1.3cm width x 0.5cm depth; 1.276cm^3 volume. RIYON, MCILROY ([id number]) Post procedure Diagnosis Wound #1: Same as Pre-Procedure Plan Wound  Cleansing: Wound #1 Left,Midline,Anterior Lower Leg: Clean wound with Normal Saline. Anesthetic: Wound #1 Left,Midline,Anterior Lower Leg: Topical Lidocaine 4% cream applied to wound bed prior to debridement Primary Wound Dressing: Wound #1 Left,Midline,Anterior Lower Leg: Aquacel Ag Secondary Dressing: Wound #1 Left,Midline,Anterior Lower Leg: Dry Gauze Dressing Change Frequency: Wound #1 Left,Midline,Anterior Lower Leg: Change dressing every  week Follow-up Appointments: Wound #1 Left,Midline,Anterior Lower Leg: Return Appointment in 1 week. Edema Control: Wound #1 Left,Midline,Anterior Lower Leg: 2 Layer Compression System - Left Lower Extremity Now that is vascular reports are back and we know he has good arterial flow to the left lower extremity but has significant venous reflux and chronic venous hypertension I have recommended a review by the vascular surgeons to see if he recommends endovenous ablation. I have recommended Aquacel Ag to the ulcer base and a 2 layer compression with Kerlix and Coban. We will keep this on for the week. All signs of vascular compromise discussed with the patient and he will let us know immediately if he has problems. We'll see him back next week. ALLISON, CORUM ([id number]) Electronic Signature(s) Signed: 10/17/2015 9:16:33 AM By: Christin Fudge MD, FACS Previous Signature: 10/13/2015 4:17:26 PM Version By: Christin Fudge MD, FACS Previous Signature: 10/13/2015 10:04:36 AM Version By: Christin Fudge MD, FACS Entered By: Christin Fudge on 10/17/2015 Bassett, Reid ([id number]) -------------------------------------------------------------------------------- SuperBill Details Patient Name: [name] Date of Service: 10/13/2015 Medical Record Number: [id number] Patient Account Number: [id number] Date of Birth/Sex: [birth year] (70 y.o. 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 I73.9 Peripheral vascular disease, unspecified I87.312 Chronic venous hypertension (idiopathic) with ulcer of left lower extremity Z94.0 Kidney transplant status Facility  Procedures CPT4 Code Description: [id number] - DEB SUBQ TISSUE 20 SQ CM/< ICD-10 Description Diagnosis E11.622 Type 2 diabetes mellitus with other skin 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 I87.312 Chronic venous hypertension  (idiopathic) with ulcer I73.9 Peripheral vascular disease, unspecified Modifier: er exposed of left lower Quantity: 1 extremity Physician Procedures CPT4 Code Description: [id number] - WC PHYS LEVEL 3 - EST PT ICD-10 Description Diagnosis E11.622 Type 2 diabetes mellitus with other skin ulcer I87.312 Chronic venous hypertension (idiopathic) with ulcer I73.9 Peripheral vascular disease, unspecified  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 Modifier: 25 of left lower er exposed Quantity: 1 extremity CPT4 Code Description: [id number] - WC PHYS SUBQ TISS 20 SQ CM ICD-10 Description Diagnosis E11.622 Type 2 diabetes mellitus with other 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

## 2015-10-13 NOTE — Progress Notes (Addendum)
[name], [name] ([id number]) Visit Report for 10/13/2015 Arrival Information Details Patient Name: [name] Date of Service: 10/13/2015 8:45 AM Medical Record Number: [id number] Patient Account Number: [id number] Date of Birth/Sex: March 16, 1946 (70 y.o. Male) Treating RN: [name] Primary Care Physician: [name] Other Clinician: Referring Physician: Donato [name] Treating Physician/Extender: [name] in Treatment: 2 Visit Information History Since Last Visit All ordered tests and consults were completed: No Patient Arrived: Ambulatory Added or deleted any medications: No Arrival Time: 08:48 Any new allergies or adverse reactions: No Accompanied By: self Had a fall or experienced change in No Transfer Assistance: None activities of daily living that may affect Patient Identification Verified: Yes risk of falls: Secondary Verification Process Yes Signs or symptoms of abuse/neglect since last No Completed: visito Patient Requires Transmission- No Hospitalized since last visit: No Based Precautions: Has Dressing in Place as Prescribed: Yes Patient Has Alerts: Yes Pain Present Now: No Patient Alerts: DM II ASA non-compressible 09/29/15 Electronic Signature(s) Signed: 10/13/2015 3:45:43 PM By: Michael Eaton, RN, [name] Entered By: Michael Eaton RN, [name] on 10/13/2015 08:49:08 [name] ([id number]) -------------------------------------------------------------------------------- Encounter Discharge Information Details Patient Name: [name] Date of Service: 10/13/2015 8:45 AM Medical Record Number: [id number] Patient Account Number: [id number] Date of Birth/Sex: [birth year] (70 y.o. Male) Treating RN: [name] Primary Care Physician: [name] Other Clinician: Referring Physician: Donato [name] Treating Physician/Extender: [name] in Treatment: 2 Encounter Discharge Information Items Discharge  Pain Level: 0 Discharge Condition: Stable Ambulatory Status: Ambulatory Discharge Destination: Home Transportation: Private Auto Accompanied By: self Schedule Follow-up Appointment: Yes Medication Reconciliation completed and provided to Patient/Care Yes [name]: Provided on Clinical Summary of Care: 10/13/2015 Form Type Recipient Paper Patient AR Electronic Signature(s) Signed: 10/13/2015 9:33:56 AM By: [name] Entered By: [name] on 10/13/2015 09:33:56 [name] ([id number]) -------------------------------------------------------------------------------- Lower Extremity Assessment Details Patient Name: [name] Date of Service: 10/13/2015 8:45 AM Medical Record Number: [id number] Patient Account Number: [id number] Date of Birth/Sex: 1945-10-22 (70 y.o. Male) Treating RN: [name] Primary Care Physician: [name] Other Clinician: Referring Physician: Donato [name] Treating Physician/Extender: [name] in Treatment: 2 Edema Assessment Assessed: [Left: No] [Right: No] E[Left: dema] [Right: :] Calf Left: Right: Point of Measurement: 38 cm From Medial Instep 36 cm cm Ankle Left: Right: Point of Measurement: 12 cm From Medial Instep 21.6 cm cm Vascular Assessment Pulses: Posterior Tibial Dorsalis Pedis Doppler: [Left:Monophasic] Extremity colors, hair growth, and conditions: Extremity Color: [Left:Mottled] Hair Growth on Extremity: [Left:Yes] Temperature of Extremity: [Left:Warm] Capillary Refill: [Left:> 3 seconds] Dependent Rubor: [Left:No] Blanched when Elevated: [Left:No] Lipodermatosclerosis: [Left:No] Toe Nail Assessment Left: Right: Thick: No Discolored: No Deformed: No Improper Length and Hygiene: No Electronic Signature(s) Signed: 10/13/2015 3:45:43 PM By: Michael Eaton RN, Shirline Frees, [name] ([id number]) Entered By: Michael Eaton RN, [name] on 10/13/2015 Conway, Grimes  ([id number]) -------------------------------------------------------------------------------- Pain Assessment Details Patient Name: [name] Date of Service: 10/13/2015 8:45 AM Medical Record Number: [id number] Patient Account Number: [id number] Date of Birth/Sex: 01-27-1946 (70 y.o. Male) Treating RN: [name] Primary Care Physician: [name] Other Clinician: Referring Physician: Donato [name] Treating Physician/Extender: [name] in Treatment: 2 Active Problems Location of Pain Severity and Description of Pain Patient Has Paino No Site Locations Rate the pain. Current Pain Level: 0 Pain Management and Medication Current Pain Management: Electronic Signature(s) Signed: 10/13/2015 3:45:43 PM By: Michael Eaton, RN, [name] Entered By: Michael Eaton RN, [name] on 10/13/2015 08:49:16 JURELL, KNICELY ([id number]) -------------------------------------------------------------------------------- Patient/Caregiver Education Details Patient Name: [name] Date of Service: 10/13/2015 8:45 AM  Medical Record Number: [id number] Patient Account Number: [id number] Date of Birth/Gender: [birth year] (70 y.o. Male) Treating RN: [name] Primary Care Physician: [name] Other Clinician: Referring Physician: Donato [name] Treating Physician/Extender: [name] in Treatment: 2 Education Assessment Education Provided To: Patient Education Topics Provided Venous: Handouts: Other: arterial and venous studies Methods: Explain/Verbal Responses: Reinforcements needed, State content correctly Electronic Signature(s) Signed: 10/13/2015 3:45:43 PM By: Michael Eaton, RN, [name] Entered By: Michael Eaton, RN, [name] on 10/13/2015 09:31:25 [name] ([id number]) -------------------------------------------------------------------------------- Wound Assessment Details Patient Name: [name] Date of Service: 10/13/2015 8:45  AM Medical Record Number: [id number] Patient Account Number: [id number] Date of Birth/Sex: May 21, 1946 (70 y.o. Male) Treating RN: [name] Primary Care Physician: [name] Other Clinician: Referring Physician: Donato [name] Treating Physician/Extender: [name] in Treatment: 2 Wound Status Wound Number: 1 Primary Trauma, Other Etiology: Wound Location: Left Lower Leg - Midline, Anterior Wound Open Status: Wounding Event: Trauma Comorbid Cataracts, Glaucoma, Anemia, Date Acquired: 07/30/2015 History: Arrhythmia, Type II Diabetes, Weeks Of Treatment: 2 Osteoarthritis, Neuropathy Clustered Wound: No Photos Wound Measurements Length: (cm) 2.5 Width: (cm) 1.3 Depth: (cm) 0.4 Area: (cm) 2.553 Volume: (cm) 1.021 % Reduction in Area: 18.7% % Reduction in Volume: -8.4% Epithelialization: None Tunneling: No Undermining: No Wound Description Full Thickness Without Exposed Foul Odor A Classification: Support Structures Diabetic Severity Grade 2 (Wagner): Wound Margin: Flat and Intact Exudate Amount: Medium Exudate Type: Serosanguineous Exudate Color: red, brown fter Cleansing: No Wound Bed Granulation Amount: Medium (34-66%) Exposed Structure Granulation Quality: Red, Pink Fascia Exposed: No Booher, Cordera ([id number]) Necrotic Amount: Medium (34-66%) Fat Layer Exposed: No Necrotic Quality: Adherent Slough Tendon Exposed: No Muscle Exposed: No Joint Exposed: No Bone Exposed: No Limited to Skin Breakdown Periwound Skin Texture Texture Color No Abnormalities Noted: No No Abnormalities Noted: No Localized Edema: Yes Erythema: No Moisture Temperature / Pain No Abnormalities Noted: No Temperature: No Abnormality Moist: No Wound Preparation Ulcer Cleansing: Rinsed/Irrigated with Saline Topical Anesthetic Applied: Other: lidocaine 4%, Treatment Notes Wound #1 (Left, Midline, Anterior Lower Leg) 1. Cleansed with: Clean wound  with Normal Saline 2. Anesthetic Topical Lidocaine 4% cream to wound bed prior to debridement 3. Peri-wound Care: Barrier cream 4. Dressing Applied: Aquacel Ag 5. Secondary Dressing Applied Dry Gauze 7. Secured with 2 Layer Compression System - Left Lower Extremity Electronic Signature(s) Signed: 10/16/2015 4:29:37 PM By: Michael Eaton RN, [name] Previous Signature: 10/13/2015 3:45:43 PM Version By: Michael Eaton RN, [name] Entered By: Michael Eaton RN, [name] on 10/16/2015 12:14:15 [name] ([id number]) -------------------------------------------------------------------------------- Vitals Details Patient Name: [name] Date of Service: 10/13/2015 8:45 AM Medical Record Number: [id number] Patient Account Number: [id number] Date of Birth/Sex: 01-21-1946 (70 y.o. Male) Treating RN: [name] Primary Care Physician: [name] Other Clinician: Referring Physician: Donato [name] Treating Physician/Extender: [name] in Treatment: 2 Vital Signs Time Taken: 08:51 Temperature (F): 98.1 Height (in): 68 Pulse (bpm): 80 Weight (lbs): 178 Blood Pressure (mmHg): 160/116 Body Mass Index (BMI): 27.1 Reference Range: 80 - 120 mg / dl Electronic Signature(s) Signed: 10/13/2015 3:45:43 PM By: Michael Eaton RN, [name] Entered By: Michael Eaton RN, [name] on 10/13/2015 08:51:35

## 2015-10-14 DIAGNOSIS — E1122 Type 2 diabetes mellitus with diabetic chronic kidney disease: Secondary | ICD-10-CM | POA: Diagnosis not present

## 2015-10-14 DIAGNOSIS — L97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er exposed: Secondary | ICD-10-CM | POA: Diagnosis not present

## 2015-10-14 DIAGNOSIS — I87312 Chronic venous hypertension (idiopathic) with ulcer of left lower extremity: Secondary | ICD-10-CM | POA: Diagnosis not present

## 2015-10-14 DIAGNOSIS — E11622 Type 2 diabetes mellitus with other skin ulcer: Secondary | ICD-10-CM | POA: Diagnosis not present

## 2015-10-14 DIAGNOSIS — I739 Peripheral vascular disease, unspecified: Secondary | ICD-10-CM | POA: Diagnosis not present

## 2015-10-14 DIAGNOSIS — Z940 Kidney transplant status: Secondary | ICD-10-CM | POA: Diagnosis not present

## 2015-10-14 NOTE — Progress Notes (Signed)
[name], [name] ([id number]) Visit Report for 10/14/2015 Arrival Information Details Patient Name: [name], [name] Date of Service: 10/14/2015 11:30 AM Medical Record Number: [id number] Patient Account Number: [id number] Date of Birth/Sex: [birth year] (70 y.o. Male) Treating RN: [name] Primary Care Physician: [name] Other Clinician: Referring Physician: Donato [name] Treating Physician/Extender: [name] in Treatment: 2 Visit Information History Since Last Visit All ordered tests and consults were completed: No Patient Arrived: Ambulatory Added or deleted any medications: No Arrival Time: 11:48 Any new allergies or adverse reactions: No Accompanied By: self Had a fall or experienced change in No Transfer Assistance: None activities of daily living that may affect Patient Identification Verified: Yes risk of falls: Secondary Verification Process Yes Signs or symptoms of abuse/neglect since last No Completed: visito Patient Requires Transmission- No Hospitalized since last visit: No Based Precautions: Has Dressing in Place as Prescribed: No Patient Has Alerts: Yes Has Compression in Place as Prescribed: No Patient Alerts: DM II Pain Present Now: No ASA non-compressible 09/29/15 Notes 2 layer wrap had fallen down Electronic Signature(s) Signed: 10/14/2015 2:37:55 PM By: Michael Eaton, RN, Sendra Entered By: Michael Eaton RN, Sendra on 10/14/2015 11:49:22 [name] ([id number]) -------------------------------------------------------------------------------- Clinic Level of Care Assessment Details Patient Name: [name] Date of Service: 10/14/2015 11:30 AM Medical Record Number: [id number] Patient Account Number: [id number] Date of Birth/Sex: 06-21-1946 (70 y.o. Male) Treating RN: [name] Primary Care Physician: [name] Other Clinician: Referring Physician: Donato [name] Treating Physician/Extender: [name] in Treatment: 2 Clinic Level of Care Assessment Items TOOL 4 Quantity Score X - Use when only an EandM is performed on FOLLOW-UP visit 1 0 ASSESSMENTS - Nursing Assessment / Reassessment X - Reassessment of Co-morbidities (includes updates in patient status) 1 10 X - Reassessment of Adherence to Treatment Plan 1 5 ASSESSMENTS - Wound and Skin Assessment / Reassessment X - Simple Wound Assessment / Reassessment - one wound 1 5 []  - Complex Wound Assessment / Reassessment - multiple wounds 0 []  - Dermatologic / Skin Assessment (not related to wound area) 0 ASSESSMENTS - Focused Assessment []  - Circumferential Edema Measurements - multi extremities 0 []  - Nutritional Assessment / Counseling / Intervention 0 []  - Lower Extremity Assessment (monofilament, tuning fork, pulses) 0 []  - Peripheral Arterial Disease Assessment (using hand held doppler) 0 ASSESSMENTS - Ostomy and/or Continence Assessment and Care []  - Incontinence Assessment and Management 0 []  - Ostomy Care Assessment and Management (repouching, etc.) 0 PROCESS - Coordination of Care X - Simple Patient / Family Education for ongoing care 1 15 []  - Complex (extensive) Patient / Family Education for ongoing care 0 []  - Staff obtains Programmer, systems, Records, Test Results / Process Orders 0 []  - Staff telephones HHA, Nursing Homes / Clarify orders / etc 0 []  - Routine Transfer to another Facility (non-emergent condition) 0 [name] ([id number]) []  - Routine Hospital Admission (non-emergent condition) 0 []  - New Admissions / Biomedical engineer / Ordering NPWT, Apligraf, etc. 0 []  - Emergency Hospital Admission (emergent condition) 0 X - Simple Discharge Coordination 1 10 []  - Complex (extensive) Discharge Coordination 0 PROCESS - Special Needs []  - Pediatric / Minor Patient Management 0 []  - Isolation Patient Management 0 []  - Hearing / Language / Visual special needs 0 []  - Assessment of Community assistance  (transportation, D/C planning, etc.) 0 []  - Additional assistance / Altered mentation 0 []  - Support Surface(s) Assessment (bed, cushion, seat, etc.) 0 INTERVENTIONS - Wound Cleansing / Measurement X - Simple Wound Cleansing -  one wound 1 5 []  - Complex Wound Cleansing - multiple wounds 0 []  - Wound Imaging (photographs - any number of wounds) 0 []  - Wound Tracing (instead of photographs) 0 []  - Simple Wound Measurement - one wound 0 []  - Complex Wound Measurement - multiple wounds 0 INTERVENTIONS - Wound Dressings []  - Small Wound Dressing one or multiple wounds 0 []  - Medium Wound Dressing one or multiple wounds 0 X - Large Wound Dressing one or multiple wounds 1 20 []  - Application of Medications - topical 0 []  - Application of Medications - injection 0 INTERVENTIONS - Miscellaneous []  - External ear exam 0 [name] ([id number]) []  - Specimen Collection (cultures, biopsies, blood, body fluids, etc.) 0 []  - Specimen(s) / Culture(s) sent or taken to Lab for analysis 0 []  - Patient Transfer (multiple staff / Harrel Lemon Lift / Similar devices) 0 []  - Simple Staple / Suture removal (25 or less) 0 []  - Complex Staple / Suture removal (26 or more) 0 []  - Hypo / Hyperglycemic Management (close monitor of Blood Glucose) 0 []  - Ankle / Brachial Index (ABI) - do not check if billed separately 0 []  - Vital Signs 0 Has the patient been seen at the hospital within the last three years: Yes Total Score: 70 Level Of Care: New/Established - Level 2 Electronic Signature(s) Signed: 10/14/2015 2:37:55 PM By: Michael Eaton, RN, Sendra Entered By: Michael Eaton RN, Sendra on 10/14/2015 12:06:01 [name] ([id number]) -------------------------------------------------------------------------------- Encounter Discharge Information Details Patient Name: [name] Date of Service: 10/14/2015 11:30 AM Medical Record Number: [id number] Patient Account Number: [id number] Date of Birth/Sex: 15-Sep-1946  (70 y.o. Male) Treating RN: [name] Primary Care Physician: [name] Other Clinician: Referring Physician: Donato [name] Treating Physician/Extender: [name] in Treatment: 2 Encounter Discharge Information Items Discharge Pain Level: 0 Discharge Condition: Stable Ambulatory Status: Ambulatory Discharge Destination: Home Private Transportation: Auto Accompanied By: self Schedule Follow-up Appointment: Yes Medication Reconciliation completed and No provided to Patient/Care Rudean Icenhour: Clinical Summary of Care: Electronic Signature(s) Signed: 10/14/2015 2:37:55 PM By: Michael Eaton, RN, Sendra Entered By: Michael Eaton RN, Sendra on 10/14/2015 11:50:25 [name] ([id number]) -------------------------------------------------------------------------------- Patient/Caregiver Education Details Patient Name: [name] Date of Service: 10/14/2015 11:30 AM Medical Record Number: [id number] Patient Account Number: [id number] Date of Birth/Gender: [birth year] (70 y.o. Male) Treating RN: [name] Primary Care Physician: [name] Other Clinician: Referring Physician: Donato [name] Treating Physician/Extender: [name] in Treatment: 2 Education Assessment Education Provided To: Patient Education Topics Provided Venous: Handouts: Other: 2 layer wrap Electronic Signature(s) Signed: 10/14/2015 2:37:55 PM By: Michael Eaton RN, Sendra Entered By: Michael Eaton RN, Sendra on 10/14/2015 11:50:02

## 2015-10-15 ENCOUNTER — Ambulatory Visit (INDEPENDENT_AMBULATORY_CARE_PROVIDER_SITE_OTHER): Payer: Medicare Other | Admitting: Vascular Surgery

## 2015-10-15 ENCOUNTER — Encounter: Payer: Self-pay | Admitting: Vascular Surgery

## 2015-10-15 VITALS — BP 148/78 | HR 77 | Temp 97.2°F | Resp 16 | Ht 68.5 in | Wt 180.0 lb

## 2015-10-15 DIAGNOSIS — D849 Immunodeficiency, unspecified: Secondary | ICD-10-CM

## 2015-10-15 DIAGNOSIS — I83029 Varicose veins of left lower extremity with ulcer of unspecified site: Secondary | ICD-10-CM

## 2015-10-15 DIAGNOSIS — L97929 Non-pressure chronic ulcer of unspecified part of left lower leg with unspecified severity: Secondary | ICD-10-CM

## 2015-10-15 DIAGNOSIS — D899 Disorder involving the immune mechanism, unspecified: Secondary | ICD-10-CM | POA: Diagnosis not present

## 2015-10-15 DIAGNOSIS — Z940 Kidney transplant status: Secondary | ICD-10-CM | POA: Diagnosis not present

## 2015-10-15 DIAGNOSIS — E1122 Type 2 diabetes mellitus with diabetic chronic kidney disease: Secondary | ICD-10-CM | POA: Diagnosis not present

## 2015-10-15 DIAGNOSIS — L97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er exposed: Secondary | ICD-10-CM | POA: Diagnosis not present

## 2015-10-15 DIAGNOSIS — I872 Venous insufficiency (chronic) (peripheral): Secondary | ICD-10-CM | POA: Insufficient documentation

## 2015-10-15 DIAGNOSIS — L97909 Non-pressure chronic ulcer of unspecified part of unspecified lower leg with unspecified severity: Secondary | ICD-10-CM

## 2015-10-15 DIAGNOSIS — I739 Peripheral vascular disease, unspecified: Secondary | ICD-10-CM | POA: Diagnosis not present

## 2015-10-15 DIAGNOSIS — E11622 Type 2 diabetes mellitus with other skin ulcer: Secondary | ICD-10-CM | POA: Diagnosis not present

## 2015-10-15 DIAGNOSIS — I83009 Varicose veins of unspecified lower extremity with ulcer of unspecified site: Secondary | ICD-10-CM | POA: Insufficient documentation

## 2015-10-15 DIAGNOSIS — I87312 Chronic venous hypertension (idiopathic) with ulcer of left lower extremity: Secondary | ICD-10-CM | POA: Diagnosis not present

## 2015-10-15 NOTE — Progress Notes (Signed)
Referred by:  [name], MD [place],  [id number]  Reason for referral: Left anterior leg ulcer   History of Present Illness  [name] is a 70 y.o. ([birth year]) male who presents with chief complaint: wound on left leg.  The patient reportedly has a laceration in his left shin in November.  This wound was healing but then worsened.  He was referred to Wound Care at [facility].  The patient denies any pain with the wound.  He denies any fever or chills.  He has never had any rest pain or intermittent claudication.  He denies history of DVT or varicose veins, history of venous stasis ulcers, Lymphedema or family history of venous disorders.  The patient has never used compression stockings in the past but has essentially been in compression for the last month during wound care.  The patient's atherosclerotic risk factors include: immunosuppression for CKT, DM, HTN, and hyperlipidemia.   Past Medical History  Diagnosis Date  . Chronic kidney disease   . Neuropathy ([place])   . Atrial fibrillation ([place])   . Hyperlipidemia   . Diabetes mellitus     Past Surgical History  Procedure Laterality Date  . Achlles tendon repair    . Bowel repla      repair  . Access proceds    . 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 Arteriovenous graft placement    . Ligation goretex graft  06/30/11    Right upper arm    Social History   Social History  . Marital Status: Married    Spouse Name: N/A  . Number of Children: N/A  . Years of Education: N/A   Occupational History  . Not on file.   Social History Main Topics  . Smoking status: Never Smoker   . Smokeless tobacco: Never Used  . Alcohol Use: No  . Drug Use: No  . Sexual Activity: Not on file   Other Topics Concern  . Not on file   Social History Narrative     Family History  Problem Relation Age of Onset  . Heart disease Mother   . Heart attack Father   . Stroke Sister     Current  Outpatient Prescriptions  Medication Sig Dispense Refill  . aspirin 81 MG tablet Take 81 mg by mouth daily.      . calcium carbonate 200 MG capsule Take 250 mg by mouth every Monday, Wednesday, and Friday. 3 times a week    . CRESTOR 5 MG tablet Take 5 mg by mouth daily.    . diphenhydrAMINE (BENADRYL) 25 mg capsule Take 25 mg by mouth at bedtime as needed (for sleep/allergies.).     Marland Kitchen furosemide (LASIX) 40 MG tablet Take 40 mg by mouth daily as needed. Fluid.    Marland Kitchen gabapentin (NEURONTIN) 100 MG capsule Take 100 mg by mouth 3 (three) times daily as needed (for nerve pain). Take three times/day as needed    . glipiZIDE (GLUCOTROL) 10 MG tablet Take 10 mg by mouth 2 (two) times daily before a meal.      . LANTUS SOLOSTAR 100 UNIT/ML Solostar Pen Inject 9 Units into the skin every morning.    . linagliptin (TRADJENTA) 5 MG TABS tablet Take 5 mg by mouth daily.      . mycophenolate (CELLCEPT) 500 MG tablet Take 500 mg by mouth 2 (two) times daily.     Marland Kitchen omega-3 acid ethyl esters (LOVAZA)  1 G capsule Take 4 g by mouth daily.     . prednisoLONE 5 MG TABS Take 5 mg by mouth daily.      . tacrolimus (PROGRAF) 1 MG capsule Take 2 mg by mouth 2 (two) times daily.     . Alcohol Swabs (B-D SINGLE USE SWABS REGULAR) PADS     . BAYER CONTOUR NEXT TEST test strip     . BD PEN NEEDLE NANO U/F 32G X 4 MM MISC     . calcitRIOL (ROCALTROL) 0.25 MCG capsule     . MICROLET LANCETS MISC     . predniSONE (DELTASONE) 5 MG tablet     . SANTYL ointment     . sulfamethoxazole-trimethoprim (BACTRIM DS,SEPTRA DS) 800-160 MG tablet      No current facility-administered medications for this visit.    Allergies  Allergen Reactions  . Elavil [Amitriptyline Hcl]     Unknown felt as if he was tripping     REVIEW OF SYSTEMS:  (Positives checked otherwise negative)  CARDIOVASCULAR:   [ ]  chest pain,  [ ]  chest pressure,  [x]  palpitations,  [ ]  shortness of breath when laying flat,  [ ]  shortness of breath with  exertion,   [ ]  pain in feet when walking,  [x]  pain in feet when laying flat, [ ]  history of blood clot in veins (DVT),  [ ]  history of phlebitis,  [ ]  swelling in legs,  [ ]  varicose veins  PULMONARY:   [ ]  productive cough,  [ ]  asthma,  [ ]  wheezing  NEUROLOGIC:   [ ]  weakness in arms or legs,  [ ]  numbness in arms or legs,  [ ]  difficulty speaking or slurred speech,  [ ]  temporary loss of vision in one eye,  [ ]  dizziness  HEMATOLOGIC:   [ ]  bleeding problems,  [ ]  problems with blood clotting too easily  MUSCULOSKEL:   [ ]  joint pain, [ ]  joint swelling  GASTROINTEST:   [ ]  vomiting blood,  [ ]  blood in stool     GENITOURINARY:   [ ]  burning with urination,  [ ]  blood in urine  PSYCHIATRIC:   [ ]  history of major depression  INTEGUMENTARY:   [ ]  rashes,  [x]  ulcers  CONSTITUTIONAL:   [ ]  fever,  [ ]  chills   Physical Examination  Filed Vitals:   10/15/15 0905 10/15/15 0911  BP: 162/89 148/78  Pulse: 77   Temp: 97.2 F (36.2 C)   TempSrc: Oral   Resp: 16   Height: 5' 8.5" (1.74 m)   Weight: 180 lb (81.647 kg)   SpO2: 100%    Body mass index is 26.97 kg/(m^2).  General: A&O x 3, WD, WN  Head: [address]/AT  Ear/Nose/Throat: Hearing grossly intact, nares without erythema or drainage, oropharynx without Erythema/Exudate, Mallampati score: 3  Eyes: PERRLA, EOMI  Neck: Supple, no nuchal rigidity, no palpable LAD  Pulmonary: Sym exp, good air movt, CTAB, no rales, rhonchi, & wheezing  Cardiac: RRR, Nl S1, S2, no Murmurs, rubs or gallops  Vascular: Vessel Right Left  Radial Palpable Palpable  Brachial Palpable Palpable  Carotid Palpable, without bruit Palpable, without bruit  Aorta Not palpable N/A  Femoral Palpable Palpable  Popliteal Not palpable Not palpable  PT Palpable Not Palpable  DP Palpable Palpable   Gastrointestinal: soft, NTND, no G/R, no HSM, no masses, no CVAT B  Musculoskeletal: M/S 5/5 throughout , Extremities without  ischemic changes , LLE  LDS, no edema, clean pretibial ulcer in proximal shin (1 cm x 2 cm x 2 cm deep), no bone evident  Neurologic: CN 2-12 intact , Pain and light touch intact in extremities , Motor exam as listed above  Psychiatric: Judgment intact, Mood & affect appropriate for pt's clinical situation  Dermatologic: See M/S exam for extremity exam, no rashes otherwise noted  Lymph : No Cervical, Axillary, or Inguinal lymphadenopathy    Non-Invasive Vascular Imaging  LLE Venous Insufficiency Duplex (Date: 10/02/15):   LLE:  no DVT and SVT,   + GSV reflux, 3.9-6.4 mm  + SSV reflux,  + deep venous reflux: CFV   ABI (Date: 10/03/15)  R:   ABI: [place]  DP: bi,   PT: tri,   TBI: 1.01  L:   ABI: 0.80,   DP: tri,   PT: mono,   TBI: 1.02    Outside Studies/Documentation 3 pages of outside documents were reviewed including: wound center chart.   Medical Decision Making  [name] is a 70 y.o. 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  His L GSV reflux disease likely is contributing to his difficulty healing.  I agree with continued wound care and compressive therapy.  His immunosuppression will also slow down his healing process.  Based on the patient's history and examination, I recommend: continue wound care with compressive therapy, followed by EVLA L GSV in 3 months..  If he does not appear to be healing, may need to proceed earlier with EVLA L GSV to help the healing process.  I discussed with the patient the use of her 20-30 mm thigh high compression stockings  and need for 3 month trial of such.  Thank you for allowing [place] to participate in this patient's care.   [name], MD [facility] Office: [phone] Pager: [phone]  10/15/2015, 10:16 AM

## 2015-10-16 NOTE — Progress Notes (Signed)
[name], [name] ([id number]) Visit Report for 10/15/2015 Arrival Information Details Patient Name: [name], [name] Date of Service: 10/15/2015 3:45 PM Medical Record Number: [id number] Patient Account Number: [id number] Date of Birth/Sex: 03-12-1946 (70 y.o. Male) Treating RN: [name], RN, BSN, Velva Harman Primary Care Physician: [name] Other Clinician: Referring Physician: [name] Treating Physician/Extender: Frann Rider in Treatment: 2 Visit Information History Since Last Visit Added or deleted any medications: No Patient Arrived: Ambulatory Any new allergies or adverse reactions: No Arrival Time: 15:33 Had a fall or experienced change in No Accompanied By: self activities of daily living that may affect Transfer Assistance: None risk of falls: Patient Identification Verified: Yes Signs or symptoms of abuse/neglect since last No Secondary Verification Process Yes visito Completed: Hospitalized since last visit: No Patient Requires Transmission- No Has Dressing in Place as Prescribed: Yes Based Precautions: Has Compression in Place as Prescribed: No Patient Has Alerts: Yes Pain Present Now: No Patient Alerts: DM II ASA non-compressible 09/29/15 Electronic Signature(s) Signed: 10/15/2015 5:22:56 PM By: Regan Lemming BSN, RN Entered By: Regan Lemming on 10/15/2015 15:43:00 [name] ([id number]) -------------------------------------------------------------------------------- Clinic Level of Care Assessment Details Patient Name: [name] Date of Service: 10/15/2015 3:45 PM Medical Record Number: [id number] Patient Account Number: [id number] Date of Birth/Sex: [birth year] (70 y.o. Male) Treating RN: [name], RN, BSN, [place] Primary Care Physician: [name] Other Clinician: Referring Physician: [name] Treating Physician/Extender: Frann Rider in Treatment: 2 Clinic Level of Care Assessment Items TOOL 4 Quantity  Score []  - Use when only an EandM is performed on FOLLOW-UP visit 0 ASSESSMENTS - Nursing Assessment / Reassessment []  - Reassessment of Co-morbidities (includes updates in patient status) 0 []  - Reassessment of Adherence to Treatment Plan 0 ASSESSMENTS - Wound and Skin Assessment / Reassessment []  - Simple Wound Assessment / Reassessment - one wound 0 []  - Complex Wound Assessment / Reassessment - multiple wounds 0 []  - Dermatologic / Skin Assessment (not related to wound area) 0 ASSESSMENTS - Focused Assessment []  - Circumferential Edema Measurements - multi extremities 0 []  - Nutritional Assessment / Counseling / Intervention 0 []  - Lower Extremity Assessment (monofilament, tuning fork, pulses) 0 []  - Peripheral Arterial Disease Assessment (using hand held doppler) 0 ASSESSMENTS - Ostomy and/or Continence Assessment and Care []  - Incontinence Assessment and Management 0 []  - Ostomy Care Assessment and Management (repouching, etc.) 0 PROCESS - Coordination of Care []  - Simple Patient / Family Education for ongoing care 0 []  - Complex (extensive) Patient / Family Education for ongoing care 0 []  - Staff obtains Programmer, systems, Records, Test Results / Process Orders 0 []  - Staff telephones HHA, Nursing Homes / Clarify orders / etc 0 []  - Routine Transfer to another Facility (non-emergent condition) 0 [name], [name] ([id number]) []  - Routine Hospital Admission (non-emergent condition) 0 []  - New Admissions / Biomedical engineer / Ordering NPWT, Apligraf, etc. 0 []  - Emergency Hospital Admission (emergent condition) 0 []  - Simple Discharge Coordination 0 []  - Complex (extensive) Discharge Coordination 0 PROCESS - Special Needs []  - Pediatric / Minor Patient Management 0 []  - Isolation Patient Management 0 []  - Hearing / Language / Visual special needs 0 []  - Assessment of Community assistance (transportation, D/C planning, etc.) 0 []  - Additional assistance / Altered mentation 0 []  -  Support Surface(s) Assessment (bed, cushion, seat, etc.) 0 INTERVENTIONS - Wound Cleansing / Measurement X - Simple Wound Cleansing - one wound 1 5 []  - Complex Wound Cleansing - multiple wounds 0 []  - Wound Imaging (  photographs - any number of wounds) 0 []  - Wound Tracing (instead of photographs) 0 []  - Simple Wound Measurement - one wound 0 []  - Complex Wound Measurement - multiple wounds 0 INTERVENTIONS - Wound Dressings X - Small Wound Dressing one or multiple wounds 1 10 []  - Medium Wound Dressing one or multiple wounds 0 []  - Large Wound Dressing one or multiple wounds 0 []  - Application of Medications - topical 0 []  - Application of Medications - injection 0 INTERVENTIONS - Miscellaneous []  - External ear exam 0 [name], [name] ([id number]) []  - Specimen Collection (cultures, biopsies, blood, body fluids, etc.) 0 []  - Specimen(s) / Culture(s) sent or taken to Lab for analysis 0 []  - Patient Transfer (multiple staff / Harrel Lemon Lift / Similar devices) 0 []  - Simple Staple / Suture removal (25 or less) 0 []  - Complex Staple / Suture removal (26 or more) 0 []  - Hypo / Hyperglycemic Management (close monitor of Blood Glucose) 0 []  - Ankle / Brachial Index (ABI) - do not check if billed separately 0 []  - Vital Signs 0 Has the patient been seen at the hospital within the last three years: Yes Total Score: 15 Level Of Care: New/Established - Level 1 Electronic Signature(s) Signed: 10/15/2015 3:57:29 PM By: Regan Lemming BSN, RN Entered By: Regan Lemming on 10/15/2015 15:57:28 [name] ([id number]) -------------------------------------------------------------------------------- Encounter Discharge Information Details Patient Name: [name] Date of Service: 10/15/2015 3:45 PM Medical Record Number: [id number] Patient Account Number: [id number] Date of Birth/Sex: 1946/01/01 (70 y.o. Male) Treating RN: [name], RN, BSN, Velva Harman Primary Care Physician: [name] Other  Clinician: Referring Physician: [name] Treating Physician/Extender: Frann Rider in Treatment: 2 Encounter Discharge Information Items Discharge Pain Level: 0 Discharge Condition: Stable Ambulatory Status: Ambulatory Discharge Destination: Home Private Transportation: Auto Accompanied By: self Schedule Follow-up Appointment: No Medication Reconciliation completed and No provided to Patient/Care Elwanda Moger: Clinical Summary of Care: Electronic Signature(s) Signed: 10/15/2015 3:57:00 PM By: Regan Lemming BSN, RN Entered By: Regan Lemming on 10/15/2015 15:57:00 [name] ([id number]) -------------------------------------------------------------------------------- Patient/Caregiver Education Details Patient Name: [name] Date of Service: 10/15/2015 3:45 PM Medical Record Number: [id number] Patient Account Number: [id number] Date of Birth/Gender: Jan 09, 1946 (70 y.o. Male) Treating RN: [name], RN, BSN, Velva Harman Primary Care Physician: [name] Other Clinician: Referring Physician: [name] Treating Physician/Extender: Frann Rider in Treatment: 2 Education Assessment Education Provided To: Patient Education Topics Provided Elevated Blood Sugar/ Impact on Healing: Methods: Explain/Verbal Responses: State content correctly Welcome To [facility]: Methods: Explain/Verbal Responses: State content correctly Electronic Signature(s) Signed: 10/15/2015 3:56:40 PM By: Regan Lemming BSN, RN Entered By: Regan Lemming on 10/15/2015 15:56:39

## 2015-10-20 ENCOUNTER — Encounter: Payer: Medicare Other | Admitting: Surgery

## 2015-10-20 DIAGNOSIS — I87312 Chronic venous hypertension (idiopathic) with ulcer of left lower extremity: Secondary | ICD-10-CM | POA: Diagnosis not present

## 2015-10-20 DIAGNOSIS — E11622 Type 2 diabetes mellitus with other skin ulcer: Secondary | ICD-10-CM | POA: Diagnosis not present

## 2015-10-20 DIAGNOSIS — L97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er exposed: Secondary | ICD-10-CM | POA: Diagnosis not present

## 2015-10-20 DIAGNOSIS — S81802A Unspecified open wound, left lower leg, initial encounter: Secondary | ICD-10-CM | POA: Diagnosis not present

## 2015-10-20 DIAGNOSIS — Z940 Kidney transplant status: Secondary | ICD-10-CM | POA: Diagnosis not present

## 2015-10-20 DIAGNOSIS — I739 Peripheral vascular disease, unspecified: Secondary | ICD-10-CM | POA: Diagnosis not present

## 2015-10-20 DIAGNOSIS — E1122 Type 2 diabetes mellitus with diabetic chronic kidney disease: Secondary | ICD-10-CM | POA: Diagnosis not present

## 2015-10-20 NOTE — Progress Notes (Addendum)
[name], [name] ([id number]) Visit Report for 10/20/2015 Chief Complaint Document Details Patient Name: [name], [name] Date of Service: 10/20/2015 8:45 AM Medical Record Number: [id number] Patient Account Number: [id number] Date of Birth/Sex: 1945/12/30 (70 y.o. Male) Treating RN: [name] Primary Care Physician: [name] Other Clinician: Referring Physician: Donato [name] Treating Physician/Extender: [name] in Treatment: 3 Information Obtained from: Patient Chief Complaint Patient presents to the wound care center for a consult due non healing wound to the left lower extremity for the last 2 months. Electronic Signature(s) Signed: 10/20/2015 9:30:39 AM By: Michael Fudge MD, FACS Entered By: [name] on 10/20/2015 09:30:39 [name], [name] ([id number]) -------------------------------------------------------------------------------- HPI Details Patient Name: [name] Date of Service: 10/20/2015 8:45 AM Medical Record Number: [id number] Patient Account Number: [id number] Date of Birth/Sex: 10-11-1945 (70 y.o. Male) Treating RN: [name] Primary Care Physician: [name] Other Clinician: Referring Physician: Donato [name] Treating Physician/Extender: [name] in Treatment: 3 History of Present Illness Location: left lower leg anteriorly on his shin Quality: Patient reports experiencing a dull pain to affected area(s). Severity: Patient states wound are getting worse. Duration: Patient has had the wound for > 2 months prior to seeking treatment at the wound center Timing: Pain in wound is Intermittent (comes and goes Context: The wound occurred when the patient had a blunt injury with a golf cart 2 months ago Modifying Factors: Other treatment(s) tried include: he has been applying local ointments to this and once the scab came out he noticed a deep ulceration. Associated Signs and Symptoms: Patient reports  having difficulty standing for long periods. HPI Description: 70 year old gentleman who was recently seen by his nephrologist Dr. Donato [name], and noted to have a wound on his left lower extremity which was lacerated 2 months ago and now has reopened. The patient's left shin has a ulceration with some exudate but no evidence of infection and he was referred to [place] for further care as it was known that the patient has had some peripheral vascular disease in the past. Past medical history significant for chronic kidney disease, atrial fibrillation, diabetes mellitus,status post kidney transplant in 1983 and 2005, a week fistula graft placement, status post previous bowel surgery. 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 10/06/2015 -- x-ray of the left tibia and fibula shows no evidence of osteomyelitis. The patient has also had Doppler studies of his extremity and is awaiting the appointment with the vascular surgeon. We have not yet received these reports. 10/13/2015 -- lower extremity venous duplex reflux evaluation shows reflux in the left common femoral vein, left saphenofemoral junction and the proximal greater saphenous vein extending to the proximal calf. There is also reflux in the left proximal to mid small saphenous vein. Arterial duplex studies done showed the resting ABI was not applicable due to tibial artery medial calcification. 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 The right TBI was 1 which is normal and the left ABI was 1 which is normal. Patient has otherwise been doing fine and has been compliant with his dressings. 10/20/2015 -- He was seen by [name] recently for a vascular opinion on 10/15/2015. His left lower extremity venous insufficiency duplex study revealed GSV reflux,SS vein reflux and deep venous  reflux in the common femoral vein. His ABIs were non compressible and his TBI on the right  was 1.01 and on the left was 0.80. He was asked to continue with the wound care with compressive therapy followed by EVLA of the left GS vein 3 months. He recommended 20-30 mm thigh-high compression stockings and the need for a three- month trial of this. the patient had an Unna boot applied at the vascular office but he could not tolerate this with a lot of pain [name], [name] ([id number]) and issues with his toes and hence came here on Friday for removal of this and we reapplied a 2 layer compression. Electronic Signature(s) Signed: 10/20/2015 9:32:24 AM By: Michael Fudge MD, FACS Previous Signature: 10/20/2015 8:00:38 AM Version By: Michael Fudge MD, FACS Entered By: [name] on 10/20/2015 09:32:23 [name], [name] ([id number]) -------------------------------------------------------------------------------- Physical Exam Details Patient Name: [name] Date of Service: 10/20/2015 8:45 AM Medical Record Number: [id number] Patient Account Number: [id number] Date of Birth/Sex: Dec 30, 1945 (70 y.o. Male) Treating RN: [name] Primary Care Physician: [name] Other Clinician: Referring Physician: Donato [name] Treating Physician/Extender: [name] in Treatment: 3 Constitutional . Pulse regular. Respirations normal and unlabored. Afebrile. . Eyes Nonicteric. Reactive to light. Ears, Nose, Mouth, and Throat Lips, teeth, and gums WNL.Marland Kitchen Moist mucosa without lesions. Neck supple and nontender. No palpable supraclavicular or cervical adenopathy. Normal sized without goiter. Respiratory WNL. No retractions.. Cardiovascular Pedal Pulses WNL. No clubbing, cyanosis or edema. Lymphatic No adneopathy. No adenopathy. No adenopathy. Musculoskeletal Adexa without tenderness or enlargement.. Digits and nails w/o clubbing, cyanosis, infection, petechiae, ischemia,  or inflammatory conditions.. Integumentary (Hair, Skin) No suspicious lesions. No crepitus or fluctuance. No peri-wound warmth or erythema. No masses.Marland Kitchen Psychiatric Judgement and insight Intact.. No evidence of depression, anxiety, or agitation.. Notes there is some subcutaneous debris but he has got hyper granulation tissue which is bleeding very briskly and had to be controlled with pressure. Electronic Signature(s) Signed: 10/20/2015 9:32:53 AM By: Michael Fudge MD, FACS Entered By: [name] on 10/20/2015 09:32:52 [name], [name] ([id number]) -------------------------------------------------------------------------------- Physician Orders Details Patient Name: [name] Date of Service: 10/20/2015 8:45 AM Medical Record Number: [id number] Patient Account Number: [id number] Date of Birth/Sex: [birth year] (70 y.o. Male) Treating RN: [name] Primary Care Physician: [name] Other Clinician: Referring Physician: Donato [name] Treating Physician/Extender: [name] in Treatment: 3 Verbal / Phone Orders: Yes Clinician: Dorthy, Joanna Read Back and Verified: Yes Diagnosis Coding Wound Cleansing Wound #1 Left,Midline,Anterior Lower Leg o Cleanse wound with mild soap and water Anesthetic Wound #1 Left,Midline,Anterior Lower Leg o Topical Lidocaine 4% cream applied to wound bed prior to debridement Primary Wound Dressing Wound #1 Left,Midline,Anterior Lower Leg o Aquacel Ag Secondary Dressing Wound #1 Left,Midline,Anterior Lower Leg o Dry Gauze Dressing Change Frequency Wound #1 Left,Midline,Anterior Lower Leg o Change dressing every week Follow-up Appointments Wound #1 Left,Midline,Anterior Lower Leg o Return Appointment in 1 week. Edema Control Wound #1 Left,Midline,Anterior Lower Leg o 2 Layer Compression System - Left Lower Extremity [employer] o order compression hose Electronic Signature(s) Signed:  10/20/2015 4:26:31 PM By: Michael Fudge MD, FACS [place], [name] ([id number]) Signed: 10/20/2015 5:07:25 PM By: [name] Entered By: [name] on 10/20/2015 09:07:43 [name], [name] ([id number]) -------------------------------------------------------------------------------- Problem List Details Patient Name: [name] Date of Service: 10/20/2015 8:45 AM Medical Record Number: [id number] Patient Account Number: [id number] Date of Birth/Sex: [birth year] (70 y.o. 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/29/2015 Yes exposed I73.9 Peripheral vascular disease, unspecified 09/29/2015 Yes I87.312 Chronic venous hypertension (idiopathic) with ulcer of left 09/29/2015 Yes lower extremity Z94.0 Kidney transplant status 09/29/2015 Yes Inactive Problems Resolved Problems Electronic Signature(s) Signed: 10/20/2015 9:30:33 AM By: Michael Fudge MD, FACS Entered By: [name] on 10/20/2015 09:30:33 [name] ([id number]) -------------------------------------------------------------------------------- Progress Note Details Patient Name: [name] Date of Service: 10/20/2015 8:45 AM Medical Record Number: [id number] Patient Account Number: [id number] Date of Birth/Sex: 11/01/1945 (70 y.o. Male) Treating RN: [name] Primary Care Physician: [name] Other Clinician: Referring Physician: Donato [name] Treating Physician/Extender: [name] in Treatment: 3 Subjective Chief Complaint Information obtained from Patient Patient presents to the wound care center for a consult due non  healing wound to the left lower extremity for the last 2 months. History of Present Illness (HPI) The following HPI elements were documented for the patient's wound: Location: left lower leg anteriorly on his shin Quality: Patient reports experiencing a dull pain to affected area(s). Severity: Patient states wound are getting worse. Duration: Patient has had the wound for > 2 months prior to seeking treatment at the wound center Timing: Pain in wound is Intermittent (comes and goes Context: The wound occurred when the patient had a blunt injury with a golf cart 2 months ago Modifying Factors: Other treatment(s) tried include: he has been applying local ointments to this and once the scab came out he noticed a deep ulceration. Associated Signs and Symptoms: Patient reports having difficulty standing for long periods. 70 year old gentleman who was recently seen by his nephrologist Dr. Donato [name], and noted to have a wound on his left lower extremity which was lacerated 2 months ago and now has reopened. The patient's left shin has a ulceration with some exudate but no evidence of infection and he was referred to [place] for further care as it was known that the patient has had some peripheral vascular disease in the past. Past medical history significant for chronic kidney disease, atrial fibrillation, diabetes mellitus,status post kidney transplant in 1983 and 2005, a week fistula graft placement, status post previous bowel surgery. 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 10/06/2015 -- x-ray of the left tibia and fibula shows no evidence of osteomyelitis. The patient has also had Doppler studies of his extremity and is awaiting the appointment with the vascular surgeon. We have not yet received these reports. 10/13/2015 -- lower extremity venous duplex reflux evaluation shows reflux in the left common femoral vein, left saphenofemoral junction and the  proximal greater saphenous vein extending to the proximal calf. There is also reflux in the left proximal to mid small saphenous vein. Arterial duplex studies done showed the resting ABI was not applicable due to tibial artery medial calcification. 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 The right TBI was 1 which is normal and the left ABI was 1 which is normal. Patient has otherwise been doing fine and has been compliant with his dressings. 10/20/2015 -- He was seen by [name] recently for a vascular opinion on 10/15/2015. His left lower extremity venous insufficiency duplex study revealed GSV reflux,SS vein reflux and deep venous reflux in [place], [name] ([id number]) the common femoral vein. His ABIs were non compressible  and his TBI on the right was 1.01 and on the left was 0.80. He was asked to continue with the wound care with compressive therapy followed by EVLA of the left GS vein 3 months. He recommended 20-30 mm thigh-high compression stockings and the need for a three- month trial of this. the patient had an Unna boot applied at the vascular office but he could not tolerate this with a lot of pain and issues with his toes and hence came here on Friday for removal of this and we reapplied a 2 layer compression. Objective Constitutional Pulse regular. Respirations normal and unlabored. Afebrile. Vitals Time Taken: 8:48 AM, Height: 68 in, Weight: 178 lbs, BMI: 27.1, Temperature: 98.2 F, Pulse: 64 bpm, Respiratory Rate: 18 breaths/min, Blood Pressure: 181/66 mmHg. Eyes Nonicteric. Reactive to light. Ears, Nose, Mouth, and Throat Lips, teeth, and gums WNL.Marland Kitchen Moist mucosa without lesions. Neck supple and nontender. No palpable supraclavicular or cervical adenopathy. Normal sized without goiter. Respiratory WNL. No retractions.. Cardiovascular Pedal  Pulses WNL. No clubbing, cyanosis or edema. Lymphatic No adneopathy. No adenopathy. No adenopathy. Musculoskeletal Adexa without tenderness or enlargement.. Digits and nails w/o clubbing, cyanosis, infection, petechiae, ischemia, or inflammatory conditions.Marland Kitchen Psychiatric Judgement and insight Intact.. No evidence of depression, anxiety, or agitation.[name], [name] ([id number]) General Notes: there is some subcutaneous debris but he has got hyper granulation tissue which is bleeding very briskly and had to be controlled with pressure. Integumentary (Hair, Skin) No suspicious lesions. No crepitus or fluctuance. No peri-wound warmth or erythema. No masses.. Wound #1 status is Open. Original cause of wound was Trauma. The wound is located on the Left,Midline,Anterior Lower Leg. The wound measures 2.3cm length x 0.9cm width x 0.3cm depth; 1.626cm^2 area and 0.488cm^3 volume. The wound is limited to skin breakdown. There is no tunneling noted. There is a medium amount of serosanguineous drainage noted. The wound margin is flat and intact. There is medium (34-66%) red, pink granulation within the wound bed. There is a medium (34-66%) amount of necrotic tissue within the wound bed including Adherent Slough. The periwound skin appearance exhibited: Localized Edema. The periwound skin appearance did not exhibit: Moist, Erythema. Periwound temperature was noted as No Abnormality. Assessment Active Problems ICD-10 E11.622 - Type 2 diabetes mellitus with other skin ulcer L97.222 - Non-pressure chronic ulcer of left calf with fat layer exposed I73.9 - Peripheral vascular disease, unspecified I87.312 - Chronic venous hypertension (idiopathic) with ulcer of left lower extremity Z94.0 - Kidney transplant status Plan Wound Cleansing: Wound #1 Left,Midline,Anterior Lower Leg: Cleanse wound with mild soap and water Anesthetic: Wound #1 Left,Midline,Anterior Lower Leg: Topical Lidocaine 4% cream  applied to wound bed prior to debridement Primary Wound Dressing: Wound #1 Left,Midline,Anterior Lower Leg: Aquacel Ag Secondary Dressing: Wound #1 Left,Midline,Anterior Lower Leg: Dry Gauze Dressing Change Frequency: Wound #1 Left,Midline,Anterior Lower Leg: [place], [name] ([id number]) Change dressing every week Follow-up Appointments: Wound #1 Left,Midline,Anterior Lower Leg: Return Appointment in 1 week. Edema Control: Wound #1 Left,Midline,Anterior Lower Leg: 2 Layer Compression System - Left Lower Extremity ordered were: order compression hose I have recommended Aquacel Ag to the ulcer base and a 2 layer compression with Kerlix and Coban. We will keep this on for the week. All signs of vascular compromise discussed with the patient and he will let us know immediately if he has problems. We'll see him back next week. Electronic Signature(s) Signed: 10/20/2015 9:33:34 AM By: Michael Fudge MD, FACS Entered By: [name] on 10/20/2015 09:33:34 [name] ([id number]) -------------------------------------------------------------------------------- SuperBill  Details Patient Name: [name], [name] Date of Service: 10/20/2015 Medical Record Number: [id number] Patient Account Number: [id number] Date of Birth/Sex: December 02, 1945 (70 y.o. Male) Treating RN: [name] Primary Care Physician: [name] Other Clinician: Referring Physician: Donato [name] Treating Physician/Extender: [name] in Treatment: 3 Diagnosis Coding ICD-10 Codes Code Description [phone] Type 2 diabetes mellitus with other skin 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er exposed I73.9 Peripheral vascular disease, unspecified I87.312 Chronic venous hypertension (idiopathic) with ulcer of left lower extremity Z94.0 Kidney transplant status Facility Procedures CPT4 Code: [id number] Description: 99213 - WOUND CARE VISIT-LEV 3 EST  PT Modifier: Quantity: 1 Physician Procedures CPT4 Code Description: [id number] - WC PHYS LEVEL 3 - EST PT ICD-10 Description Diagnosis E11.622 Type 2 diabetes mellitus with other skin ulcer L97.222 Non-pressure chronic ulcer of left calf with fat la I73.9 Peripheral vascular disease, unspecified  I87.312 Chronic venous hypertension (idiopathic) with ulcer Modifier: yer exposed of left lower Quantity: 1 extremity Electronic Signature(s) Signed: 10/20/2015 9:33:50 AM By: Michael Fudge MD, FACS Entered By: [name] on 10/20/2015 09:33:50

## 2015-10-21 ENCOUNTER — Encounter (HOSPITAL_BASED_OUTPATIENT_CLINIC_OR_DEPARTMENT_OTHER): Payer: Medicare Other

## 2015-10-21 NOTE — Progress Notes (Signed)
[name] ([id number]) Visit Report for 10/20/2015 Arrival Information Details Patient Name: [name] Date of Service: 10/20/2015 8:45 AM Medical Record Number: [id number] Patient Account Number: [id number] Date of Birth/Sex: [birth year] (70 y.o. Male) Treating RN: Montey Hora Primary Care Physician: [name] Other Clinician: Referring Physician: [name] Treating Physician/Extender: Frann Rider in Treatment: 3 Visit Information History Since Last Visit Added or deleted any medications: No Patient Arrived: Ambulatory Any new allergies or adverse reactions: No Arrival Time: 08:46 Had a fall or experienced change in No Accompanied By: self activities of daily living that may affect Transfer Assistance: None risk of falls: Patient Identification Verified: Yes Signs or symptoms of abuse/neglect since last No Secondary Verification Process Yes visito Completed: Hospitalized since last visit: No Patient Requires Transmission- No Pain Present Now: No Based Precautions: Patient Has Alerts: Yes Patient Alerts: DM II ASA non-compressible 09/29/15 Electronic Signature(s) Signed: 10/20/2015 5:07:25 PM By: Montey Hora Entered By: Montey Hora on 10/20/2015 08:47:59 [name] ([id number]) -------------------------------------------------------------------------------- Clinic Level of Care Assessment Details Patient Name: [name] Date of Service: 10/20/2015 8:45 AM Medical Record Number: [id number] Patient Account Number: [id number] Date of Birth/Sex: 07/17/1946 (70 y.o. Male) Treating RN: Montey Hora Primary Care Physician: [name] Other Clinician: Referring Physician: [name] Treating Physician/Extender: Frann Rider in Treatment: 3 Clinic Level of Care Assessment Items TOOL 4 Quantity Score []  - Use when only an EandM is performed on FOLLOW-UP visit 0 ASSESSMENTS - Nursing Assessment /  Reassessment X - Reassessment of Co-morbidities (includes updates in patient status) 1 10 X - Reassessment of Adherence to Treatment Plan 1 5 ASSESSMENTS - Wound and Skin Assessment / Reassessment X - Simple Wound Assessment / Reassessment - one wound 1 5 []  - Complex Wound Assessment / Reassessment - multiple wounds 0 []  - Dermatologic / Skin Assessment (not related to wound area) 0 ASSESSMENTS - Focused Assessment X - Circumferential Edema Measurements - multi extremities 1 5 []  - Nutritional Assessment / Counseling / Intervention 0 X - Lower Extremity Assessment (monofilament, tuning fork, pulses) 1 5 []  - Peripheral Arterial Disease Assessment (using hand held doppler) 0 ASSESSMENTS - Ostomy and/or Continence Assessment and Care []  - Incontinence Assessment and Management 0 []  - Ostomy Care Assessment and Management (repouching, etc.) 0 PROCESS - Coordination of Care X - Simple Patient / Family Education for ongoing care 1 15 []  - Complex (extensive) Patient / Family Education for ongoing care 0 []  - Staff obtains Programmer, systems, Records, Test Results / Process Orders 0 []  - Staff telephones HHA, Nursing Homes / Clarify orders / etc 0 []  - Routine Transfer to another Facility (non-emergent condition) 0 [name] ([id number]) []  - Routine Hospital Admission (non-emergent condition) 0 []  - New Admissions / Biomedical engineer / Ordering NPWT, Apligraf, etc. 0 []  - Emergency Hospital Admission (emergent condition) 0 X - Simple Discharge Coordination 1 10 []  - Complex (extensive) Discharge Coordination 0 PROCESS - Special Needs []  - Pediatric / Minor Patient Management 0 []  - Isolation Patient Management 0 []  - Hearing / Language / Visual special needs 0 []  - Assessment of Community assistance (transportation, D/C planning, etc.) 0 []  - Additional assistance / Altered mentation 0 []  - Support Surface(s) Assessment (bed, cushion, seat, etc.) 0 INTERVENTIONS - Wound Cleansing /  Measurement X - Simple Wound Cleansing - one wound 1 5 []  - Complex Wound Cleansing - multiple wounds 0 X - Wound Imaging (photographs - any number of wounds) 1 5 []  - Wound Tracing (instead  of photographs) 0 X - Simple Wound Measurement - one wound 1 5 []  - Complex Wound Measurement - multiple wounds 0 INTERVENTIONS - Wound Dressings []  - Small Wound Dressing one or multiple wounds 0 []  - Medium Wound Dressing one or multiple wounds 0 []  - Large Wound Dressing one or multiple wounds 0 X - Application of Medications - topical 1 5 []  - Application of Medications - injection 0 INTERVENTIONS - Miscellaneous []  - External ear exam 0 [name] ([id number]) []  - Specimen Collection (cultures, biopsies, blood, body fluids, etc.) 0 []  - Specimen(s) / Culture(s) sent or taken to Lab for analysis 0 []  - Patient Transfer (multiple staff / Harrel Lemon Lift / Similar devices) 0 []  - Simple Staple / Suture removal (25 or less) 0 []  - Complex Staple / Suture removal (26 or more) 0 []  - Hypo / Hyperglycemic Management (close monitor of Blood Glucose) 0 []  - Ankle / Brachial Index (ABI) - do not check if billed separately 0 X - Vital Signs 1 5 Has the patient been seen at the hospital within the last three years: Yes Total Score: 80 Level Of Care: New/Established - Level 3 Electronic Signature(s) Signed: 10/20/2015 5:07:25 PM By: Montey Hora Entered By: Montey Hora on 10/20/2015 09:08:32 [name] ([id number]) -------------------------------------------------------------------------------- Encounter Discharge Information Details Patient Name: [name] Date of Service: 10/20/2015 8:45 AM Medical Record Number: [id number] Patient Account Number: [id number] Date of Birth/Sex: Dec 07, 1945 (70 y.o. Male) Treating RN: Montey Hora Primary Care Physician: [name] Other Clinician: Referring Physician: [name] Treating Physician/Extender: Frann Rider in  Treatment: 3 Encounter Discharge Information Items Discharge Pain Level: 0 Discharge Condition: Stable Ambulatory Status: Ambulatory Discharge Destination: Home Transportation: Private Auto Accompanied By: self Schedule Follow-up Appointment: Yes Medication Reconciliation completed and provided to Patient/Care No Samson Ralph: Provided on Clinical Summary of Care: 10/20/2015 Form Type Recipient Paper Patient AR Electronic Signature(s) Signed: 10/20/2015 5:07:25 PM By: Montey Hora Previous Signature: 10/20/2015 9:19:18 AM Version By: Ruthine Dose Entered By: Montey Hora on 10/20/2015 09:20:01 [name] ([id number]) -------------------------------------------------------------------------------- Lower Extremity Assessment Details Patient Name: [name] Date of Service: 10/20/2015 8:45 AM Medical Record Number: [id number] Patient Account Number: [id number] Date of Birth/Sex: 08/03/1946 (70 y.o. Male) Treating RN: Montey Hora Primary Care Physician: [name] Other Clinician: Referring Physician: [name] Treating Physician/Extender: Frann Rider in Treatment: 3 Edema Assessment Assessed: [Left: No] [Right: No] Edema: [Left: Ye] [Right: s] Calf Left: Right: Point of Measurement: 38 cm From Medial Instep 35.7 cm cm Ankle Left: Right: Point of Measurement: 12 cm From Medial Instep 21.3 cm cm Vascular Assessment Pulses: Posterior Tibial Dorsalis Pedis Palpable: [Left:Yes] Extremity colors, hair growth, and conditions: Extremity Color: [Left:Hyperpigmented] Hair Growth on Extremity: [Left:Yes] Temperature of Extremity: [Left:Warm] Capillary Refill: [Left:< 3 seconds] Electronic Signature(s) Signed: 10/20/2015 5:07:25 PM By: Montey Hora Entered By: Montey Hora on 10/20/2015 08:59:50 [name] ([id number]) -------------------------------------------------------------------------------- Multi Wound Chart  Details Patient Name: [name] Date of Service: 10/20/2015 8:45 AM Medical Record Number: [id number] Patient Account Number: [id number] Date of Birth/Sex: 01-16-1946 (70 y.o. Male) Treating RN: Montey Hora Primary Care Physician: [name] Other Clinician: Referring Physician: [name] Treating Physician/Extender: Frann Rider in Treatment: 3 Vital Signs Height(in): 68 Pulse(bpm): 64 Weight(lbs): 178 Blood Pressure 181/66 (mmHg): Body Mass Index(BMI): 27 Temperature(F): 98.2 Respiratory Rate 18 (breaths/min): Photos: [1:No Photos] [N/A:N/A] Wound Location: [1:Left Lower Leg - Midline, N/A Anterior] Wounding Event: [1:Trauma] [N/A:N/A] Primary Etiology: [1:Trauma, Other] [N/A:N/A] Comorbid History: [1:Cataracts, Glaucoma, Anemia, Arrhythmia, Type  II Diabetes, Osteoarthritis, Neuropathy] [N/A:N/A] Date Acquired: [1:07/30/2015] [N/A:N/A] Weeks of Treatment: [1:3] [N/A:N/A] Wound Status: [1:Open] [N/A:N/A] Measurements L x W x D 2.3x0.9x0.3 [N/A:N/A] (cm) Area (cm) : [1:1.626] [N/A:N/A] Volume (cm) : [1:0.488] [N/A:N/A] % Reduction in Area: [1:48.20%] [N/A:N/A] % Reduction in Volume: 48.20% [N/A:N/A] Classification: [1:Full Thickness Without Exposed Support Structures] [N/A:N/A] HBO Classification: [1:Grade 2] [N/A:N/A] Exudate Amount: [1:Medium] [N/A:N/A] Exudate Type: [1:Serosanguineous] [N/A:N/A] Exudate Color: [1:red, brown] [N/A:N/A] Wound Margin: [1:Flat and Intact] [N/A:N/A] Granulation Amount: [1:Medium (34-66%)] [N/A:N/A] Granulation Quality: [1:Red, Pink] [N/A:N/A] Necrotic Amount: [1:Medium (34-66%)] [N/A:N/A] Exposed Structures: [N/A:N/A] Fascia: No Fat: No Tendon: No Muscle: No Joint: No Bone: No Limited to Skin Breakdown Epithelialization: None N/A N/A Periwound Skin Texture: Edema: Yes N/A N/A Periwound Skin Moist: No N/A N/A Moisture: Periwound Skin Color: Erythema: No N/A N/A Temperature: No Abnormality  N/A N/A Tenderness on No N/A N/A Palpation: Wound Preparation: Ulcer Cleansing: Other: N/A N/A soap and water Topical Anesthetic Applied: Other: lidocaine 4% Treatment Notes Electronic Signature(s) Signed: 10/20/2015 5:07:25 PM By: Montey Hora Entered By: Montey Hora on 10/20/2015 09:06:07 [name] ([id number]) -------------------------------------------------------------------------------- Multi-Disciplinary Care Plan Details Patient Name: [name] Date of Service: 10/20/2015 8:45 AM Medical Record Number: [id number] Patient Account Number: [id number] Date of Birth/Sex: [birth year] (70 y.o. Male) Treating RN: Montey Hora Primary Care Physician: [name] Other Clinician: Referring Physician: [name] Treating Physician/Extender: Frann Rider in Treatment: 3 Active Inactive Abuse / Safety / Falls / Self Care Management Nursing Diagnoses: Potential for falls Goals: Patient will remain injury free Date Initiated: 09/29/2015 Goal Status: Active Interventions: Assess fall risk on admission and as needed Notes: Nutrition Nursing Diagnoses: Imbalanced nutrition Goals: Patient/caregiver verbalizes understanding of need to maintain therapeutic glucose control per primary care physician Date Initiated: 09/29/2015 Goal Status: Active Patient/caregiver will maintain therapeutic glucose control Date Initiated: 09/29/2015 Goal Status: Active Interventions: Assess patient nutrition upon admission and as needed per policy Provide education on elevated blood sugars and impact on wound healing Notes: Orientation to the Wound Care Program Nursing Diagnoses: HAREL, EMORY ([id number]) Knowledge deficit related to the wound healing center program Goals: Patient/caregiver will verbalize understanding of the [facility] Date Initiated: 09/29/2015 Goal Status: Active Interventions: Provide education on orientation to the  wound center Notes: Pain, Acute or Chronic Nursing Diagnoses: Pain, acute or chronic: actual or potential Potential alteration in comfort, pain Goals: Patient will verbalize adequate pain control and receive pain control interventions during procedures as needed Date Initiated: 09/29/2015 Goal Status: Active Interventions: Assess comfort goal upon admission Complete pain assessment as per visit requirements Notes: Wound/Skin Impairment Nursing Diagnoses: Impaired tissue integrity Goals: Ulcer/skin breakdown will have a volume reduction of 30% by week 4 Date Initiated: 09/29/2015 Goal Status: Active Ulcer/skin breakdown will have a volume reduction of 50% by week 8 Date Initiated: 09/29/2015 Goal Status: Active Ulcer/skin breakdown will have a volume reduction of 80% by week 12 Date Initiated: 09/29/2015 Goal Status: Active Interventions: GARLON, KERBO ([id number]) Assess patient/caregiver ability to obtain necessary supplies Assess patient/caregiver ability to perform ulcer/skin care regimen upon admission and as needed Notes: Electronic Signature(s) Signed: 10/20/2015 5:07:25 PM By: Montey Hora Entered By: Montey Hora on 10/20/2015 09:05:58 [name] ([id number]) -------------------------------------------------------------------------------- Patient/Caregiver Education Details Patient Name: [name] Date of Service: 10/20/2015 8:45 AM Medical Record Number: [id number] Patient Account Number: [id number] Date of Birth/Gender: [birth year] (70 y.o. Male) Treating RN: Montey Hora Primary Care Physician: [name] Other Clinician: Referring Physician: [name] Treating Physician/Extender: Frann Rider in Treatment:  3 Education Assessment Education Provided To: Patient Education Topics Provided Venous: Handouts: Controlling Swelling with Compression Stockings , Other: wrap precautions Methods: Explain/Verbal,  Printed Responses: State content correctly Electronic Signature(s) Signed: 10/20/2015 5:07:25 PM By: Montey Hora Entered By: Montey Hora on 10/20/2015 09:20:20 [name] ([id number]) -------------------------------------------------------------------------------- Wound Assessment Details Patient Name: [name] Date of Service: 10/20/2015 8:45 AM Medical Record Number: [id number] Patient Account Number: [id number] Date of Birth/Sex: 03-02-1946 (70 y.o. Male) Treating RN: Montey Hora Primary Care Physician: [name] Other Clinician: Referring Physician: [name] Treating Physician/Extender: Frann Rider in Treatment: 3 Wound Status Wound Number: 1 Primary Trauma, Other Etiology: Wound Location: Left Lower Leg - Midline, Anterior Wound Open Status: Wounding Event: Trauma Comorbid Cataracts, Glaucoma, Anemia, Date Acquired: 07/30/2015 History: Arrhythmia, Type II Diabetes, Weeks Of Treatment: 3 Osteoarthritis, Neuropathy Clustered Wound: No Photos Photo Uploaded By: Montey Hora on 10/20/2015 09:26:47 Wound Measurements Length: (cm) 2.3 Width: (cm) 0.9 Depth: (cm) 0.3 Area: (cm) 1.626 Volume: (cm) 0.488 % Reduction in Area: 48.2% % Reduction in Volume: 48.2% Epithelialization: None Tunneling: No Wound Description Full Thickness Without Exposed Foul Odor Af Classification: Support Structures Diabetic Severity Grade 2 (Wagner): Wound Margin: Flat and Intact Exudate Amount: Medium Exudate Type: Serosanguineous Exudate Color: red, brown ter Cleansing: No Wound Bed Granulation Amount: Medium (34-66%) Exposed Structure Jeanty, Evelio ([id number]) Granulation Quality: Red, Pink Fascia Exposed: No Necrotic Amount: Medium (34-66%) Fat Layer Exposed: No Necrotic Quality: Adherent Slough Tendon Exposed: No Muscle Exposed: No Joint Exposed: No Bone Exposed: No Limited to Skin Breakdown Periwound Skin  Texture Texture Color No Abnormalities Noted: No No Abnormalities Noted: No Localized Edema: Yes Erythema: No Moisture Temperature / Pain No Abnormalities Noted: No Temperature: No Abnormality Moist: No Wound Preparation Ulcer Cleansing: Other: soap and water, Topical Anesthetic Applied: Other: lidocaine 4%, Treatment Notes Wound #1 (Left, Midline, Anterior Lower Leg) 1. Cleansed with: Cleanse wound with antibacterial soap and water 2. Anesthetic Topical Lidocaine 4% cream to wound bed prior to debridement 4. Dressing Applied: Aquacel Ag 5. Secondary Dressing Applied Dry Gauze 7. Secured with Tape 2 Layer Compression System - Left Lower Extremity Notes gauze and kerlix from toes to 3cm below the knee Electronic Signature(s) Signed: 10/20/2015 5:07:25 PM By: Montey Hora Entered By: Montey Hora on 10/20/2015 08:59:21 [name] ([id number]) -------------------------------------------------------------------------------- Fossil Details Patient Name: [name] Date of Service: 10/20/2015 8:45 AM Medical Record Number: [id number] Patient Account Number: [id number] Date of Birth/Sex: 08-Mar-1946 (70 y.o. Male) Treating RN: Montey Hora Primary Care Physician: [name] Other Clinician: Referring Physician: [name] Treating Physician/Extender: Frann Rider in Treatment: 3 Vital Signs Time Taken: 08:48 Temperature (F): 98.2 Height (in): 68 Pulse (bpm): 64 Weight (lbs): 178 Respiratory Rate (breaths/min): 18 Body Mass Index (BMI): 27.1 Blood Pressure (mmHg): 181/66 Reference Range: 80 - 120 mg / dl Electronic Signature(s) Signed: 10/20/2015 5:07:25 PM By: Montey Hora Entered By: Montey Hora on 10/20/2015 08:49:22

## 2015-10-30 ENCOUNTER — Encounter: Payer: Medicare Other | Attending: Surgery | Admitting: Surgery

## 2015-10-30 DIAGNOSIS — I4891 Unspecified atrial fibrillation: Secondary | ICD-10-CM | POA: Diagnosis not present

## 2015-10-30 DIAGNOSIS — E1122 Type 2 diabetes mellitus with diabetic chronic kidney disease: Secondary | ICD-10-CM | POA: Diagnosis not present

## 2015-10-30 DIAGNOSIS — L97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er exposed: Secondary | ICD-10-CM | POA: Insufficient documentation

## 2015-10-30 DIAGNOSIS — I87312 Chronic venous hypertension (idiopathic) with ulcer of left lower extremity: Secondary | ICD-10-CM | POA: Insufficient documentation

## 2015-10-30 DIAGNOSIS — N189 Chronic kidney disease, unspecified: Secondary | ICD-10-CM | POA: Diagnosis not present

## 2015-10-30 DIAGNOSIS — I739 Peripheral vascular disease, unspecified: Secondary | ICD-10-CM | POA: Insufficient documentation

## 2015-10-30 DIAGNOSIS — E11622 Type 2 diabetes mellitus with other skin ulcer: Secondary | ICD-10-CM | POA: Insufficient documentation

## 2015-10-30 DIAGNOSIS — Z940 Kidney transplant status: Secondary | ICD-10-CM | POA: Insufficient documentation

## 2015-10-30 DIAGNOSIS — S81802A Unspecified open wound, left lower leg, initial encounter: Secondary | ICD-10-CM | POA: Diagnosis not present

## 2015-10-31 NOTE — Progress Notes (Addendum)
ADAEL, LINDAU ([id number]) Visit Report for 10/30/2015 Chief Complaint Document Details Patient Name: [name], [name] Date of Service: 10/30/2015 8:45 AM Medical Record Number: [id number] Patient Account Number: [id number] Date of Birth/Sex: 29-Jun-1946 (70 y.o. Male) Treating RN: Ahmed Prima Primary Care Physician: [name] Other Clinician: Referring Physician: [name] Treating Physician/Extender: Frann Rider in Treatment: 4 Information Obtained from: Patient Chief Complaint Patient presents to the wound care center for a consult due non healing wound to the left lower extremity for the last 2 months. Electronic Signature(s) Signed: 10/30/2015 9:12:17 AM By: Christin Fudge MD, FACS Entered By: Christin Fudge on 10/30/2015 09:12:17 [name], [name] ([id number]) -------------------------------------------------------------------------------- Debridement Details Patient Name: [name] Date of Service: 10/30/2015 8:45 AM Medical Record Number: [id number] Patient Account Number: [id number] Date of Birth/Sex: 12-13-1945 (70 y.o. Male) Treating RN: Ahmed Prima Primary Care Physician: [name] Other Clinician: Referring Physician: [name] Treating Physician/Extender: Frann Rider in Treatment: 4 Debridement Performed for Wound #1 Left,Midline,Anterior Lower Leg Assessment: Performed By: Physician Christin Fudge, MD Debridement: Debridement Pre-procedure Yes Verification/Time Out Taken: Start Time: 09:16 Pain Control: Other : lidocaine 4% Level: Skin/Subcutaneous Tissue Total Area Debrided (L x 2 (cm) x 0.9 (cm) = 1.8 (cm) W): Tissue and other Viable, Non-Viable, Exudate, Fibrin/Slough, Subcutaneous material debrided: Instrument: Forceps Bleeding: Minimum Hemostasis Achieved: Pressure End Time: 09:18 Procedural Pain: 0 Post Procedural Pain: 0 Response to Treatment: Procedure was tolerated well Post  Debridement Measurements of Total Wound Length: (cm) 2 Width: (cm) 0.9 Depth: (cm) 0.3 Volume: (cm) 0.424 Post Procedure Diagnosis Same as Pre-procedure Electronic Signature(s) Signed: 10/30/2015 9:21:07 AM By: Christin Fudge MD, FACS Signed: 10/30/2015 5:51:12 PM By: Alric Quan Entered By: Christin Fudge on 10/30/2015 09:21:07 [name] ([id number]) -------------------------------------------------------------------------------- HPI Details Patient Name: [name] Date of Service: 10/30/2015 8:45 AM Medical Record Number: [id number] Patient Account Number: [id number] Date of Birth/Sex: 20-Jun-1946 (70 y.o. Male) Treating RN: Ahmed Prima Primary Care Physician: [name] Other Clinician: Referring Physician: [name] Treating Physician/Extender: Frann Rider in Treatment: 4 History of Present Illness Location: left lower leg anteriorly on his shin Quality: Patient reports experiencing a dull pain to affected area(s). Severity: Patient states wound are getting worse. Duration: Patient has had the wound for > 2 months prior to seeking treatment at the wound center Timing: Pain in wound is Intermittent (comes and goes Context: The wound occurred when the patient had a blunt injury with a golf cart 2 months ago Modifying Factors: Other treatment(s) tried include: he has been applying local ointments to this and once the scab came out he noticed a deep ulceration. Associated Signs and Symptoms: Patient reports having difficulty standing for long periods. HPI Description: 70 year old gentleman who was recently seen by his nephrologist [name], and noted to have a wound on his left lower extremity which was lacerated 2 months ago and now has reopened. The patient's left shin has a ulceration with some exudate but no evidence of infection and he was referred to [place] for further care as it was known that the patient has had some  peripheral vascular disease in the past. Past medical history significant for chronic kidney disease, atrial fibrillation, diabetes mellitus,status post kidney transplant in 1983 and 2005, a week fistula graft placement, status post previous bowel surgery. 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 10/06/2015 -- x-ray of the left tibia and fibula shows no evidence of osteomyelitis. The patient has also had Doppler  studies of his extremity and is awaiting the appointment with the vascular surgeon. We have not yet received these reports. 10/13/2015 -- lower extremity venous duplex reflux evaluation shows reflux in the left common femoral vein, left saphenofemoral junction and the proximal greater saphenous vein extending to the proximal calf. There is also reflux in the left proximal to mid small saphenous vein. Arterial duplex studies done showed the resting ABI was not applicable due to tibial artery medial calcification. 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 The right TBI was 1 which is normal and the left ABI was 1 which is normal. Patient has otherwise been doing fine and has been compliant with his dressings. 10/20/2015 -- He was seen by [name] recently for a vascular opinion on 10/15/2015. His left lower extremity venous insufficiency duplex study revealed GSV reflux,SS vein reflux and deep venous reflux in the common femoral vein. His ABIs were non compressible and his TBI on the right was 1.01 and on the left was 0.80. He was asked to continue with the wound care with compressive therapy followed by EVLA of the left GS vein 3 months. He recommended 20-30 mm thigh-high compression stockings and the need for a three- month trial of this. the patient had an Unna boot applied at the vascular office but he could not tolerate  this with a lot of pain [name], [name] ([id number]) and issues with his toes and hence came here on Friday for removal of this and we reapplied a 2 layer compression. Electronic Signature(s) Signed: 10/30/2015 9:12:22 AM By: Christin Fudge MD, FACS Entered By: Christin Fudge on 10/30/2015 09:12:22 [name], [name] ([id number]) -------------------------------------------------------------------------------- Physical Exam Details Patient Name: [name] Date of Service: 10/30/2015 8:45 AM Medical Record Number: [id number] Patient Account Number: [id number] Date of Birth/Sex: Dec 02, 1945 (70 y.o. Male) Treating RN: Ahmed Prima Primary Care Physician: [name] Other Clinician: Referring Physician: [name] Treating Physician/Extender: Frann Rider in Treatment: 4 Constitutional . Pulse regular. Respirations normal and unlabored. Afebrile. . Eyes Nonicteric. Reactive to light. Ears, Nose, Mouth, and Throat Lips, teeth, and gums WNL.Marland Kitchen Moist mucosa without lesions. Neck supple and nontender. No palpable supraclavicular or cervical adenopathy. Normal sized without goiter. Respiratory WNL. No retractions.. Cardiovascular Pedal Pulses WNL. No clubbing, cyanosis or edema. Lymphatic No adneopathy. No adenopathy. No adenopathy. Musculoskeletal Adexa without tenderness or enlargement.. Digits and nails w/o clubbing, cyanosis, infection, petechiae, ischemia, or inflammatory conditions.. Integumentary (Hair, Skin) No suspicious lesions. No crepitus or fluctuance. No peri-wound warmth or erythema. No masses.Marland Kitchen Psychiatric Judgement and insight Intact.. No evidence of depression, anxiety, or agitation.. Notes the wound as some superficial debris which was removed with a forcep and this was possibly just dressing material which is stuck. Under this there is healthy granulation tissue with good amount of healing and minimal bleeding was controlled with  pressure Electronic Signature(s) Signed: 10/30/2015 9:20:51 AM By: Christin Fudge MD, FACS Entered By: Christin Fudge on 10/30/2015 09:20:50 [name], [name] ([id number]) -------------------------------------------------------------------------------- Physician Orders Details Patient Name: [name] Date of Service: 10/30/2015 8:45 AM Medical Record Number: [id number] Patient Account Number: [id number] Date of Birth/Sex: 1946-04-06 (70 y.o. Male) Treating RN: Ahmed Prima Primary Care Physician: [name] Other Clinician: Referring Physician: [name] Treating Physician/Extender: Frann Rider in Treatment: 4 Verbal / Phone Orders: Yes ClinicianCarolyne Fiscal, Debi Read Back and Verified: Yes Diagnosis Coding ICD-10 Coding Code Description E11.622 Type  2 diabetes mellitus with other skin 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er exposed I73.9 Peripheral vascular disease, unspecified I87.312 Chronic venous hypertension (idiopathic) with ulcer of left lower extremity Z94.0 Kidney transplant status Wound Cleansing Wound #1 Left,Midline,Anterior Lower Leg o Cleanse wound with mild soap and water Anesthetic Wound #1 Left,Midline,Anterior Lower Leg o Topical Lidocaine 4% cream applied to wound bed prior to debridement Primary Wound Dressing Wound #1 Left,Midline,Anterior Lower Leg o Prisma Ag Secondary Dressing Wound #1 Left,Midline,Anterior Lower Leg o ABD pad o Dry Gauze Dressing Change Frequency Wound #1 Left,Midline,Anterior Lower Leg o Change dressing every week Follow-up Appointments Wound #1 Left,Midline,Anterior Lower Leg o Return Appointment in 1 week. Edema Control [name], [name] ([id number]) Wound #1 Left,Midline,Anterior Lower Leg o 2 Layer Compression System - Left Lower Extremity o Elevate legs to the level of the heart and pump ankles as often as possible Electronic Signature(s) Signed:  10/30/2015 4:43:36 PM By: Christin Fudge MD, FACS Signed: 10/30/2015 5:51:12 PM By: Alric Quan Entered By: Alric Quan on 10/30/2015 09:19:47 [name] ([id number]) -------------------------------------------------------------------------------- Problem List Details Patient Name: [name] Date of Service: 10/30/2015 8:45 AM Medical Record Number: [id number] Patient Account Number: [id number] Date of Birth/Sex: Jan 19, 1946 (70 y.o. Male) Treating RN: Ahmed Prima Primary Care Physician: [name] Other Clinician: Referring Physician: [name] Treating Physician/Extender: Frann Rider in Treatment: 4 Active Problems ICD-10 Encounter Code Description Active Date Diagnosis E11.622 Type 2 diabetes mellitus with other skin ulcer 09/29/2015 Yes 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er 09/29/2015 Yes exposed I73.9 Peripheral vascular disease, unspecified 09/29/2015 Yes I87.312 Chronic venous hypertension (idiopathic) with ulcer of left 09/29/2015 Yes lower extremity Z94.0 Kidney transplant status 09/29/2015 Yes Inactive Problems Resolved Problems Electronic Signature(s) Signed: 10/30/2015 9:12:11 AM By: Christin Fudge MD, FACS Entered By: Christin Fudge on 10/30/2015 09:12:11 [name] ([id number]) -------------------------------------------------------------------------------- Progress Note Details Patient Name: [name] Date of Service: 10/30/2015 8:45 AM Medical Record Number: [id number] Patient Account Number: [id number] Date of Birth/Sex: 15-Jul-1946 (70 y.o. Male) Treating RN: Ahmed Prima Primary Care Physician: [name] Other Clinician: Referring Physician: [name] Treating Physician/Extender: Frann Rider in Treatment: 4 Subjective Chief Complaint Information obtained from Patient Patient presents to the wound care center for a consult due non healing wound to the left lower  extremity for the last 2 months. History of Present Illness (HPI) The following HPI elements were documented for the patient's wound: Location: left lower leg anteriorly on his shin Quality: Patient reports experiencing a dull pain to affected area(s). Severity: Patient states wound are getting worse. Duration: Patient has had the wound for > 2 months prior to seeking treatment at the wound center Timing: Pain in wound is Intermittent (comes and goes Context: The wound occurred when the patient had a blunt injury with a golf cart 2 months ago Modifying Factors: Other treatment(s) tried include: he has been applying local ointments to this and once the scab came out he noticed a deep ulceration. Associated Signs and Symptoms: Patient reports having difficulty standing for long periods. 70 year old gentleman who was recently seen by his nephrologist [name], and noted to have a wound on his left lower extremity which was lacerated 2 months ago and now has reopened. The patient's left shin has a ulceration with some exudate but no evidence of infection and he was referred to [place] for further care as it was known that the patient has had some peripheral vascular disease in the past.  Past medical history significant for chronic kidney disease, atrial fibrillation, diabetes mellitus,status post kidney transplant in 1983 and 2005, a week fistula graft placement, status post previous bowel surgery. 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 10/06/2015 -- x-ray of the left tibia and fibula shows no evidence of osteomyelitis. The patient has also had Doppler studies of his extremity and is awaiting the appointment with the vascular surgeon. We have not yet received these reports. 10/13/2015 -- lower extremity venous duplex reflux evaluation shows reflux in the left common femoral vein, left saphenofemoral junction and the proximal greater saphenous vein  extending to the proximal calf. There is also reflux in the left proximal to mid small saphenous vein. Arterial duplex studies done showed the resting ABI was not applicable due to tibial artery medial calcification. 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 The right TBI was 1 which is normal and the left ABI was 1 which is normal. Patient has otherwise been doing fine and has been compliant with his dressings. 10/20/2015 -- He was seen by [name] recently for a vascular opinion on 10/15/2015. His left lower extremity venous insufficiency duplex study revealed GSV reflux,SS vein reflux and deep venous reflux in [place], [name] ([id number]) the common femoral vein. His ABIs were non compressible and his TBI on the right was 1.01 and on the left was 0.80. He was asked to continue with the wound care with compressive therapy followed by EVLA of the left GS vein 3 months. He recommended 20-30 mm thigh-high compression stockings and the need for a three- month trial of this. the patient had an Unna boot applied at the vascular office but he could not tolerate this with a lot of pain and issues with his toes and hence came here on Friday for removal of this and we reapplied a 2 layer compression. Objective Constitutional Pulse regular. Respirations normal and unlabored. Afebrile. Vitals Time Taken: 9:00 AM, Height: 68 in, Weight: 178 lbs, BMI: 27.1, Temperature: 98.3 F, Pulse: 69 bpm, Respiratory Rate: 18 breaths/min, Blood Pressure: 158/78 mmHg. Eyes Nonicteric. Reactive to light. Ears, Nose, Mouth, and Throat Lips, teeth, and gums WNL.Marland Kitchen Moist mucosa without lesions. Neck supple and nontender. No palpable supraclavicular or cervical adenopathy. Normal sized without goiter. Respiratory WNL. No retractions.. Cardiovascular Pedal Pulses WNL. No clubbing,  cyanosis or edema. Lymphatic No adneopathy. No adenopathy. No adenopathy. Musculoskeletal Adexa without tenderness or enlargement.. Digits and nails w/o clubbing, cyanosis, infection, petechiae, ischemia, or inflammatory conditions.Marland Kitchen Psychiatric Judgement and insight Intact.. No evidence of depression, anxiety, or agitation.[name], [name] ([id number]) General Notes: the wound as some superficial debris which was removed with a forcep and this was possibly just dressing material which is stuck. Under this there is healthy granulation tissue with good amount of healing and minimal bleeding was controlled with pressure Integumentary (Hair, Skin) No suspicious lesions. No crepitus or fluctuance. No peri-wound warmth or erythema. No masses.. Wound #1 status is Open. Original cause of wound was Trauma. The wound is located on the Left,Midline,Anterior Lower Leg. The wound measures 2cm length x 0.9cm width x 0.1cm depth; 1.414cm^2 area and 0.141cm^3 volume. The wound is limited to skin breakdown. There is no tunneling or undermining noted. There is a medium amount of serosanguineous drainage noted. The wound margin is flat and intact. There is no granulation within  the wound bed. There is a large (67-100%) amount of necrotic tissue within the wound bed including Eschar. The periwound skin appearance exhibited: Localized Edema. The periwound skin appearance did not exhibit: Moist, Erythema. Periwound temperature was noted as No Abnormality. Assessment Active Problems ICD-10 E11.622 - Type 2 diabetes mellitus with other skin ulcer L97.222 - Non-pressure chronic ulcer of left calf with fat layer exposed I73.9 - Peripheral vascular disease, unspecified I87.312 - Chronic venous hypertension (idiopathic) with ulcer of left lower extremity Z94.0 - Kidney transplant status We are going to use Prisma AG and a 2 layer compression wrap as this is all he can tolerate. I have also discussed with him  the need to get compression stockings which was supposed to be ordered by his vascular surgeon and they wanted the thigh-high variety. I will let it be prescribed by his vascular surgeon and we will continue with local compression wraps until the wound heals. Procedures Wound #1 Wound #1 is a Trauma, Other located on the Left,Midline,Anterior Lower Leg . There was a Skin/Subcutaneous Tissue Debridement [id number]) debridement with total area of 1.8 sq cm performed by Christin Fudge, MD. with the following instrument(s): Forceps to remove Viable and Non-Viable tissue/material including Exudate, Fibrin/Slough, and Subcutaneous after achieving pain control using Other (lidocaine 4%). A time out was conducted prior to the start of the procedure. A Minimum amount of bleeding was controlled with Pressure. The procedure was tolerated well with a pain level of 0 throughout and a pain [place], [name] ([id number]) level of 0 following the procedure. Post Debridement Measurements: 2cm length x 0.9cm width x 0.3cm depth; 0.424cm^3 volume. Post procedure Diagnosis Wound #1: Same as Pre-Procedure Plan Wound Cleansing: Wound #1 Left,Midline,Anterior Lower Leg: Cleanse wound with mild soap and water Anesthetic: Wound #1 Left,Midline,Anterior Lower Leg: Topical Lidocaine 4% cream applied to wound bed prior to debridement Primary Wound Dressing: Wound #1 Left,Midline,Anterior Lower Leg: Prisma Ag Secondary Dressing: Wound #1 Left,Midline,Anterior Lower Leg: ABD pad Dry Gauze Dressing Change Frequency: Wound #1 Left,Midline,Anterior Lower Leg: Change dressing every week Follow-up Appointments: Wound #1 Left,Midline,Anterior Lower Leg: Return Appointment in 1 week. Edema Control: Wound #1 Left,Midline,Anterior Lower Leg: 2 Layer Compression System - Left Lower Extremity Elevate legs to the level of the heart and pump ankles as often as possible We are going to use Prisma AG and a 2 layer  compression wrap as this is all he can tolerate. I have also discussed with him the need to get compression stockings which was supposed to be ordered by his vascular surgeon and they wanted the thigh-high variety. I will let it be prescribed by his vascular surgeon and we will continue with local compression wraps until the wound heals. Electronic Signature(s) Signed: 10/30/2015 9:23:19 AM By: Christin Fudge MD, FACS [name], [name] ([id number]) Entered By: Christin Fudge on 10/30/2015 09:23:19 [name], [name] ([id number]) -------------------------------------------------------------------------------- SuperBill Details Patient Name: [name] Date of Service: 10/30/2015 Medical Record Number: [id number] Patient Account Number: [id number] Date of Birth/Sex: October 31, 1945 (70 y.o. 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 Z94.0 Kidney transplant status Facility Procedures CPT4 Code Description: [id number] - DEB SUBQ TISSUE 20 SQ CM/< ICD-10 Description  Diagnosis E11.622 Type 2 diabetes mellitus with other skin 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 I73.9 Peripheral vascular disease,  unspecified I87.312 Chronic venous hypertension (idiopathic) with ulcer Modifier: er exposed of left lower Quantity: 1 extremity Physician Procedures CPT4 Code Description: [id number] - WC PHYS SUBQ TISS 20 SQ CM ICD-10 Description Diagnosis E11.622 Type 2 diabetes mellitus with other skin ulcer L97.222 Non-pressure  chronic ulcer of left calf with fat lay I73.9 Peripheral vascular disease,  unspecified I87.312 Chronic venous hypertension (idiopathic) with ulcer Modifier: er exposed of left lower Quantity: 1 extremity Electronic Signature(s) Signed: 10/30/2015 9:23:32 AM By: Christin Fudge MD, FACS Entered By: Christin Fudge on 10/30/2015 09:23:32

## 2015-10-31 NOTE — Progress Notes (Signed)
ISSACHAR, HAWRYLUK ([id number]) Visit Report for 10/30/2015 Arrival Information Details Patient Name: [name], [name] Date of Service: 10/30/2015 8:45 AM Medical Record Number: [id number] Patient Account Number: [id number] Date of Birth/Sex: [birth year] (70 y.o. Male) Treating RN: Ahmed Prima Primary Care Physician: [name] Other Clinician: Referring Physician: [name] Treating Physician/Extender: Frann Rider in Treatment: 4 Visit Information History Since Last Visit All ordered tests and consults were completed: No Patient Arrived: Ambulatory Added or deleted any medications: No Arrival Time: 08:55 Any new allergies or adverse reactions: No Accompanied By: SELF Had a fall or experienced change in No Transfer Assistance: None activities of daily living that may affect Patient Identification Verified: Yes risk of falls: Secondary Verification Process Yes Signs or symptoms of abuse/neglect since last No Completed: visito Patient Requires Transmission- No Hospitalized since last visit: No Based Precautions: Pain Present Now: No Patient Has Alerts: Yes Patient Alerts: DM II ASA non-compressible 09/29/15 Electronic Signature(s) Signed: 10/30/2015 5:51:12 PM By: Alric Quan Entered By: Alric Quan on 10/30/2015 09:00:05 [name] ([id number]) -------------------------------------------------------------------------------- Encounter Discharge Information Details Patient Name: [name] Date of Service: 10/30/2015 8:45 AM Medical Record Number: [id number] Patient Account Number: [id number] Date of Birth/Sex: 01-03-1946 (70 y.o. Male) Treating RN: Ahmed Prima Primary Care Physician: [name] Other Clinician: Referring Physician: [name] Treating Physician/Extender: Frann Rider in Treatment: 4 Encounter Discharge Information Items Discharge Pain Level: 0 Discharge Condition: Stable Ambulatory  Status: Ambulatory Discharge Destination: Home Transportation: Private Auto Accompanied By: self Schedule Follow-up Appointment: Yes Medication Reconciliation completed and provided to Patient/Care Yes Madilyne Tadlock: Provided on Clinical Summary of Care: 10/30/2015 Form Type Recipient Paper Patient AR Electronic Signature(s) Signed: 10/30/2015 9:41:05 AM By: Ruthine Dose Entered By: Ruthine Dose on 10/30/2015 09:41:05 [name] ([id number]) -------------------------------------------------------------------------------- Lower Extremity Assessment Details Patient Name: [name] Date of Service: 10/30/2015 8:45 AM Medical Record Number: [id number] Patient Account Number: [id number] Date of Birth/Sex: 04-Jan-1946 (70 y.o. Male) Treating RN: Ahmed Prima Primary Care Physician: [name] Other Clinician: Referring Physician: [name] Treating Physician/Extender: Frann Rider in Treatment: 4 Edema Assessment Assessed: [Left: No] [Right: No] E[Left: dema] [Right: :] Calf Left: Right: Point of Measurement: cm From Medial Instep 36 cm cm Ankle Left: Right: Point of Measurement: cm From Medial Instep 20.8 cm cm Vascular Assessment Pulses: Posterior Tibial Dorsalis Pedis Palpable: [Left:Yes] Extremity colors, hair growth, and conditions: Extremity Color: [Left:Hyperpigmented] Temperature of Extremity: [Left:Warm] Capillary Refill: [Left:< 3 seconds] Toe Nail Assessment Left: Right: Thick: No Discolored: No Deformed: No Improper Length and Hygiene: No Electronic Signature(s) Signed: 10/30/2015 5:51:12 PM By: Alric Quan Entered By: Alric Quan on 10/30/2015 09:09:19 [name] ([id number]) -------------------------------------------------------------------------------- Multi Wound Chart Details Patient Name: [name] Date of Service: 10/30/2015 8:45 AM Medical Record Number: [id number] Patient Account Number:  [id number] Date of Birth/Sex: Apr 12, 1946 (70 y.o. Male) Treating RN: Carolyne Fiscal, Debi Primary Care Physician: [name] Other Clinician: Referring Physician: [name] Treating Physician/Extender: Frann Rider in Treatment: 4 Vital Signs Height(in): 68 Pulse(bpm): 69 Weight(lbs): 178 Blood Pressure 158/78 (mmHg): Body Mass Index(BMI): 27 Temperature(F): 98.3 Respiratory Rate 18 (breaths/min): Photos: [1:No Photos] [N/A:N/A] Wound Location: [1:Left Lower Leg - Midline, N/A Anterior] Wounding Event: [1:Trauma] [N/A:N/A] Primary Etiology: [1:Trauma, Other] [N/A:N/A] Comorbid History: [1:Cataracts, Glaucoma, Anemia, Arrhythmia, Type II Diabetes, Osteoarthritis, Neuropathy] [N/A:N/A] Date Acquired: [1:07/30/2015] [N/A:N/A] Weeks of Treatment: [1:4] [N/A:N/A] Wound Status: [1:Open] [N/A:N/A] Measurements L x W x D 2x0.9x0.1 [N/A:N/A] (cm) Area (cm) : [1:1.414] [N/A:N/A] Volume (cm) : [1:0.141] [N/A:N/A] % Reduction in Area: [1:55.00%] [N/A:N/A] %  Reduction in Volume: 85.00% [N/A:N/A] Classification: [1:Full Thickness Without Exposed Support Structures] [N/A:N/A] HBO Classification: [1:Grade 2] [N/A:N/A] Exudate Amount: [1:Medium] [N/A:N/A] Exudate Type: [1:Serosanguineous] [N/A:N/A] Exudate Color: [1:red, brown] [N/A:N/A] Wound Margin: [1:Flat and Intact] [N/A:N/A] Granulation Amount: [1:None Present (0%)] [N/A:N/A] Necrotic Amount: [1:Large (67-100%)] [N/A:N/A] Necrotic Tissue: [1:Eschar] [N/A:N/A] Exposed Structures: [N/A:N/A] Fascia: No Fat: No Tendon: No Muscle: No Joint: No Bone: No Limited to Skin Breakdown Epithelialization: Small (1-33%) N/A N/A Periwound Skin Texture: Edema: Yes N/A N/A Periwound Skin Moist: No N/A N/A Moisture: Periwound Skin Color: Erythema: No N/A N/A Temperature: No Abnormality N/A N/A Tenderness on No N/A N/A Palpation: Wound Preparation: Ulcer Cleansing: Other: N/A N/A soap and water Topical  Anesthetic Applied: Other: lidocaine 4% Treatment Notes Electronic Signature(s) Signed: 10/30/2015 5:51:12 PM By: Alric Quan Entered By: Alric Quan on 10/30/2015 09:12:25 [name] ([id number]) -------------------------------------------------------------------------------- [place] Details Patient Name: [name] Date of Service: 10/30/2015 8:45 AM Medical Record Number: [id number] Patient Account Number: [id number] Date of Birth/Sex: 01-04-1946 (70 y.o. Male) Treating RN: Ahmed Prima Primary Care Physician: [name] Other Clinician: Referring Physician: [name] Treating Physician/Extender: Frann Rider in Treatment: 4 Active Inactive Abuse / Safety / Falls / Self Care Management Nursing Diagnoses: Potential for falls Goals: Patient will remain injury free Date Initiated: 09/29/2015 Goal Status: Active Interventions: Assess fall risk on admission and as needed Notes: Nutrition Nursing Diagnoses: Imbalanced nutrition Goals: Patient/caregiver verbalizes understanding of need to maintain therapeutic glucose control per primary care physician Date Initiated: 09/29/2015 Goal Status: Active Patient/caregiver will maintain therapeutic glucose control Date Initiated: 09/29/2015 Goal Status: Active Interventions: Assess patient nutrition upon admission and as needed per policy Provide education on elevated blood sugars and impact on wound healing Notes: Orientation to the Wound Care Program Nursing Diagnoses: TAMIM, PUMA ([id number]) Knowledge deficit related to the wound healing center program Goals: Patient/caregiver will verbalize understanding of the [facility] Date Initiated: 09/29/2015 Goal Status: Active Interventions: Provide education on orientation to the wound center Notes: Pain, Acute or Chronic Nursing Diagnoses: Pain, acute or chronic: actual or potential Potential  alteration in comfort, pain Goals: Patient will verbalize adequate pain control and receive pain control interventions during procedures as needed Date Initiated: 09/29/2015 Goal Status: Active Interventions: Assess comfort goal upon admission Complete pain assessment as per visit requirements Notes: Wound/Skin Impairment Nursing Diagnoses: Impaired tissue integrity Goals: Ulcer/skin breakdown will have a volume reduction of 30% by week 4 Date Initiated: 09/29/2015 Goal Status: Active Ulcer/skin breakdown will have a volume reduction of 50% by week 8 Date Initiated: 09/29/2015 Goal Status: Active Ulcer/skin breakdown will have a volume reduction of 80% by week 12 Date Initiated: 09/29/2015 Goal Status: Active Interventions: [name], [name] ([id number]) Assess patient/caregiver ability to obtain necessary supplies Assess patient/caregiver ability to perform ulcer/skin care regimen upon admission and as needed Notes: Electronic Signature(s) Signed: 10/30/2015 5:51:12 PM By: Alric Quan Entered By: Alric Quan on 10/30/2015 09:11:43 [name] ([id number]) -------------------------------------------------------------------------------- Pain Assessment Details Patient Name: [name] Date of Service: 10/30/2015 8:45 AM Medical Record Number: [id number] Patient Account Number: [id number] Date of Birth/Sex: 04/26/1946 (70 y.o. Male) Treating RN: Ahmed Prima Primary Care Physician: [name] Other Clinician: Referring Physician: [name] Treating Physician/Extender: Frann Rider in Treatment: 4 Active Problems Location of Pain Severity and Description of Pain Patient Has Paino No Site Locations Pain Management and Medication Current Pain Management: Electronic Signature(s) Signed: 10/30/2015 5:51:12 PM By: Alric Quan Entered By: Alric Quan on 10/30/2015 09:00:11 [name], [name]  (  [id number]) -------------------------------------------------------------------------------- Patient/Caregiver Education Details Patient Name: [name] Date of Service: 10/30/2015 8:45 AM Medical Record Number: [id number] Patient Account Number: [id number] Date of Birth/Gender: 03/27/1946 (70 y.o. Male) Treating RN: Ahmed Prima Primary Care Physician: [name] Other Clinician: Referring Physician: [name] Treating Physician/Extender: Frann Rider in Treatment: 4 Education Assessment Education Provided To: Patient Education Topics Provided Wound/Skin Impairment: Handouts: Other: do not get wrap wet Methods: Demonstration, Explain/Verbal Responses: State content correctly Electronic Signature(s) Signed: 10/30/2015 5:51:12 PM By: Alric Quan Entered By: Alric Quan on 10/30/2015 09:21:09 [name] ([id number]) -------------------------------------------------------------------------------- Wound Assessment Details Patient Name: [name] Date of Service: 10/30/2015 8:45 AM Medical Record Number: [id number] Patient Account Number: [id number] Date of Birth/Sex: 04-20-1946 (70 y.o. Male) Treating RN: Carolyne Fiscal, Debi Primary Care Physician: [name] Other Clinician: Referring Physician: [name] Treating Physician/Extender: Frann Rider in Treatment: 4 Wound Status Wound Number: 1 Primary Trauma, Other Etiology: Wound Location: Left Lower Leg - Midline, Anterior Wound Open Status: Wounding Event: Trauma Comorbid Cataracts, Glaucoma, Anemia, Date Acquired: 07/30/2015 History: Arrhythmia, Type II Diabetes, Weeks Of Treatment: 4 Osteoarthritis, Neuropathy Clustered Wound: No Photos Photo Uploaded By: Alric Quan on 10/30/2015 17:26:18 Wound Measurements Length: (cm) 2 Width: (cm) 0.9 Depth: (cm) 0.1 Area: (cm) 1.414 Volume: (cm) 0.141 % Reduction in Area: 55% % Reduction in  Volume: 85% Epithelialization: Small (1-33%) Tunneling: No Undermining: No Wound Description Full Thickness Without Exposed Foul Odor A Classification: Support Structures Diabetic Severity Grade 2 (Wagner): Wound Margin: Flat and Intact Exudate Amount: Medium Exudate Type: Serosanguineous Exudate Color: red, brown fter Cleansing: No Wound Bed Granulation Amount: None Present (0%) Exposed Structure [name], [name] ([id number]) Necrotic Amount: Large (67-100%) Fascia Exposed: No Necrotic Quality: Eschar Fat Layer Exposed: No Tendon Exposed: No Muscle Exposed: No Joint Exposed: No Bone Exposed: No Limited to Skin Breakdown Periwound Skin Texture Texture Color No Abnormalities Noted: No No Abnormalities Noted: No Localized Edema: Yes Erythema: No Moisture Temperature / Pain No Abnormalities Noted: No Temperature: No Abnormality Moist: No Wound Preparation Ulcer Cleansing: Other: soap and water, Topical Anesthetic Applied: Other: lidocaine 4%, Treatment Notes Wound #1 (Left, Midline, Anterior Lower Leg) 1. Cleansed with: Cleanse wound with antibacterial soap and water 2. Anesthetic Topical Lidocaine 4% cream to wound bed prior to debridement 4. Dressing Applied: Prisma Ag 5. Secondary Dressing Applied ABD Pad Dry Gauze 7. Secured with Tape 2 Layer Lite Compression System - Left Lower Extremity Notes gauze and kerlix from toes to 3cm below the knee Electronic Signature(s) Signed: 10/30/2015 5:51:12 PM By: Alric Quan Entered By: Alric Quan on 10/30/2015 09:11:35 [name] ([id number]) -------------------------------------------------------------------------------- Vitals Details Patient Name: [name] Date of Service: 10/30/2015 8:45 AM Medical Record Number: [id number] Patient Account Number: [id number] Date of Birth/Sex: 1946/02/09 (70 y.o. Male) Treating RN: Ahmed Prima Primary Care Physician: [name] Other  Clinician: Referring Physician: [name] Treating Physician/Extender: Frann Rider in Treatment: 4 Vital Signs Time Taken: 09:00 Temperature (F): 98.3 Height (in): 68 Pulse (bpm): 69 Weight (lbs): 178 Respiratory Rate (breaths/min): 18 Body Mass Index (BMI): 27.1 Blood Pressure (mmHg): 158/78 Reference Range: 80 - 120 mg / dl Electronic Signature(s) Signed: 10/30/2015 5:51:12 PM By: Alric Quan Entered By: Alric Quan on 10/30/2015 09:01:57

## 2015-11-03 ENCOUNTER — Encounter: Payer: Medicare Other | Admitting: Surgery

## 2015-11-03 DIAGNOSIS — L97221 Non-pressure chronic ulcer of left calf limited to breakdown of skin: Secondary | ICD-10-CM | POA: Diagnosis not present

## 2015-11-03 DIAGNOSIS — I87312 Chronic venous hypertension (idiopathic) with ulcer of left lower extremity: Secondary | ICD-10-CM | POA: Diagnosis not present

## 2015-11-03 DIAGNOSIS — Z940 Kidney transplant status: Secondary | ICD-10-CM | POA: Diagnosis not present

## 2015-11-03 DIAGNOSIS — I739 Peripheral vascular disease, unspecified: Secondary | ICD-10-CM | POA: Diagnosis not present

## 2015-11-03 DIAGNOSIS — L97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er exposed: Secondary | ICD-10-CM | POA: Diagnosis not present

## 2015-11-03 DIAGNOSIS — E1122 Type 2 diabetes mellitus with diabetic chronic kidney disease: Secondary | ICD-10-CM | POA: Diagnosis not present

## 2015-11-03 DIAGNOSIS — E11622 Type 2 diabetes mellitus with other skin ulcer: Secondary | ICD-10-CM | POA: Diagnosis not present

## 2015-11-04 NOTE — Progress Notes (Signed)
[name], [name] ([id number]) Visit Report for 11/03/2015 Arrival Information Details Patient Name: [name], [name] Date of Service: 11/03/2015 9:30 AM Medical Record Number: [id number] Patient Account Number: [id number] Date of Birth/Sex: 10-21-1945 (70 y.o. Male) Treating RN: Ahmed Prima Primary Care Physician: [name] Other Clinician: Referring Physician: [name] Treating Physician/Extender: Frann Rider in Treatment: 5 Visit Information History Since Last Visit All ordered tests and consults were completed: No Patient Arrived: Ambulatory Added or deleted any medications: No Arrival Time: 09:31 Any new allergies or adverse reactions: No Accompanied By: self Had a fall or experienced change in No Transfer Assistance: None activities of daily living that may affect Patient Identification Verified: Yes risk of falls: Secondary Verification Process Yes Signs or symptoms of abuse/neglect since last No Completed: visito Patient Requires Transmission- No Hospitalized since last visit: No Based Precautions: Pain Present Now: No Patient Has Alerts: Yes Patient Alerts: DM II ASA non-compressible 09/29/15 Electronic Signature(s) Signed: 11/03/2015 5:59:16 PM By: Alric Quan Entered By: Alric Quan on 11/03/2015 09:32:29 [name] ([id number]) -------------------------------------------------------------------------------- Encounter Discharge Information Details Patient Name: [name] Date of Service: 11/03/2015 9:30 AM Medical Record Number: [id number] Patient Account Number: [id number] Date of Birth/Sex: November 16, 1945 (70 y.o. Male) Treating RN: Ahmed Prima Primary Care Physician: [name] Other Clinician: Referring Physician: [name] Treating Physician/Extender: Frann Rider in Treatment: 5 Encounter Discharge Information Items Discharge Pain Level: 0 Discharge Condition: Stable Ambulatory  Status: Ambulatory Discharge Destination: Home Transportation: Private Auto Accompanied By: self Schedule Follow-up Appointment: Yes Medication Reconciliation completed and provided to Patient/Care Yes Jennylee Uehara: Provided on Clinical Summary of Care: 11/03/2015 Form Type Recipient Paper Patient AR Electronic Signature(s) Signed: 11/03/2015 10:11:27 AM By: Ruthine Dose Entered By: Ruthine Dose on 11/03/2015 10:11:27 [name] ([id number]) -------------------------------------------------------------------------------- Lower Extremity Assessment Details Patient Name: [name] Date of Service: 11/03/2015 9:30 AM Medical Record Number: [id number] Patient Account Number: [id number] Date of Birth/Sex: May 01, 1946 (70 y.o. Male) Treating RN: Ahmed Prima Primary Care Physician: [name] Other Clinician: Referring Physician: [name] Treating Physician/Extender: Frann Rider in Treatment: 5 Edema Assessment Assessed: [Left: No] [Right: No] E[Left: dema] [Right: :] Calf Left: Right: Point of Measurement: 38 cm From Medial Instep 35.2 cm cm Ankle Left: Right: Point of Measurement: 12 cm From Medial Instep 21 cm cm Vascular Assessment Pulses: Posterior Tibial Dorsalis Pedis Palpable: [Left:Yes] Extremity colors, hair growth, and conditions: Extremity Color: [Left:Hyperpigmented] Temperature of Extremity: [Left:Warm] Capillary Refill: [Left:< 3 seconds] Toe Nail Assessment Left: Right: Thick: No Discolored: No Deformed: No Improper Length and Hygiene: No Electronic Signature(s) Signed: 11/03/2015 5:59:16 PM By: Alric Quan Entered By: Alric Quan on 11/03/2015 10:00:13 [name] ([id number]) -------------------------------------------------------------------------------- Multi Wound Chart Details Patient Name: [name] Date of Service: 11/03/2015 9:30 AM Medical Record Number: [id number] Patient Account  Number: [id number] Date of Birth/Sex: [birth year] (71 y.o. Male) Treating RN: Ahmed Prima Primary Care Physician: [name] Other Clinician: Referring Physician: [name] Treating Physician/Extender: Frann Rider in Treatment: 5 Vital Signs Height(in): 68 Pulse(bpm): 72 Weight(lbs): 178 Blood Pressure 148/96 (mmHg): Body Mass Index(BMI): 27 Temperature(F): 98.0 Respiratory Rate 20 (breaths/min): Photos: [1:No Photos] [N/A:N/A] Wound Location: [1:Left Lower Leg - Midline, N/A Anterior] Wounding Event: [1:Trauma] [N/A:N/A] Primary Etiology: [1:Trauma, Other] [N/A:N/A] Comorbid History: [1:Cataracts, Glaucoma, Anemia, Arrhythmia, Type II Diabetes, Osteoarthritis, Neuropathy] [N/A:N/A] Date Acquired: [1:07/30/2015] [N/A:N/A] Weeks of Treatment: [1:5] [N/A:N/A] Wound Status: [1:Open] [N/A:N/A] Measurements L x W x D 1.2x0.6x0.2 [N/A:N/A] (cm) Area (cm) : [1:0.565] [N/A:N/A] Volume (cm) : [1:0.113] [N/A:N/A] % Reduction in Area: [  1:82.00%] [N/A:N/A] % Reduction in Volume: 88.00% [N/A:N/A] Classification: [1:Full Thickness Without Exposed Support Structures] [N/A:N/A] HBO Classification: [1:Grade 2] [N/A:N/A] Exudate Amount: [1:Large] [N/A:N/A] Exudate Type: [1:Serosanguineous] [N/A:N/A] Exudate Color: [1:red, brown] [N/A:N/A] Wound Margin: [1:Flat and Intact] [N/A:N/A] Granulation Amount: [1:Large (67-100%)] [N/A:N/A] Granulation Quality: [1:Red, Pink] [N/A:N/A] Necrotic Amount: [1:Small (1-33%)] [N/A:N/A] Exposed Structures: [N/A:N/A] Fascia: No Fat: No Tendon: No Muscle: No Joint: No Bone: No Limited to Skin Breakdown Epithelialization: Small (1-33%) N/A N/A Periwound Skin Texture: Edema: Yes N/A N/A Periwound Skin Moist: Yes N/A N/A Moisture: Periwound Skin Color: Erythema: No N/A N/A Temperature: No Abnormality N/A N/A Tenderness on No N/A N/A Palpation: Wound Preparation: Ulcer Cleansing: Other: N/A N/A soap and  water Topical Anesthetic Applied: Other: lidocaine 4% Treatment Notes Electronic Signature(s) Signed: 11/03/2015 5:59:16 PM By: Alric Quan Entered By: Alric Quan on 11/03/2015 09:45:00 [name] ([id number]) -------------------------------------------------------------------------------- [name] Details Patient Name: [name] Date of Service: 11/03/2015 9:30 AM Medical Record Number: [id number] Patient Account Number: [id number] Date of Birth/Sex: 08/21/1946 (70 y.o. Male) Treating RN: Ahmed Prima Primary Care Physician: [name] Other Clinician: Referring Physician: [name] Treating Physician/Extender: Frann Rider in Treatment: 5 Active Inactive Abuse / Safety / Falls / Self Care Management Nursing Diagnoses: Potential for falls Goals: Patient will remain injury free Date Initiated: 09/29/2015 Goal Status: Active Interventions: Assess fall risk on admission and as needed Notes: Nutrition Nursing Diagnoses: Imbalanced nutrition Goals: Patient/caregiver verbalizes understanding of need to maintain therapeutic glucose control per primary care physician Date Initiated: 09/29/2015 Goal Status: Active Patient/caregiver will maintain therapeutic glucose control Date Initiated: 09/29/2015 Goal Status: Active Interventions: Assess patient nutrition upon admission and as needed per policy Provide education on elevated blood sugars and impact on wound healing Notes: Orientation to the Wound Care Program Nursing Diagnoses: [name], [name] ([id number]) Knowledge deficit related to the wound healing center program Goals: Patient/caregiver will verbalize understanding of the [facility] Date Initiated: 09/29/2015 Goal Status: Active Interventions: Provide education on orientation to the wound center Notes: Pain, Acute or Chronic Nursing Diagnoses: Pain, acute or chronic: actual or  potential Potential alteration in comfort, pain Goals: Patient will verbalize adequate pain control and receive pain control interventions during procedures as needed Date Initiated: 09/29/2015 Goal Status: Active Interventions: Assess comfort goal upon admission Complete pain assessment as per visit requirements Notes: Wound/Skin Impairment Nursing Diagnoses: Impaired tissue integrity Goals: Ulcer/skin breakdown will have a volume reduction of 30% by week 4 Date Initiated: 09/29/2015 Goal Status: Active Ulcer/skin breakdown will have a volume reduction of 50% by week 8 Date Initiated: 09/29/2015 Goal Status: Active Ulcer/skin breakdown will have a volume reduction of 80% by week 12 Date Initiated: 09/29/2015 Goal Status: Active Interventions: [name], [name] ([id number]) Assess patient/caregiver ability to obtain necessary supplies Assess patient/caregiver ability to perform ulcer/skin care regimen upon admission and as needed Notes: Electronic Signature(s) Signed: 11/03/2015 5:59:16 PM By: Alric Quan Entered By: Alric Quan on 11/03/2015 09:44:45 [name] ([id number]) -------------------------------------------------------------------------------- Pain Assessment Details Patient Name: [name] Date of Service: 11/03/2015 9:30 AM Medical Record Number: [id number] Patient Account Number: [id number] Date of Birth/Sex: 1946/05/10 (70 y.o. Male) Treating RN: Ahmed Prima Primary Care Physician: [name] Other Clinician: Referring Physician: [name] Treating Physician/Extender: Frann Rider in Treatment: 5 Active Problems Location of Pain Severity and Description of Pain Patient Has Paino No Site Locations Pain Management and Medication Current Pain Management: Electronic Signature(s) Signed: 11/03/2015 5:59:16 PM By: Alric Quan Entered By: Alric Quan on 11/03/2015 09:32:35  [name], [name]  ([id number]) -------------------------------------------------------------------------------- Patient/Caregiver Education Details Patient Name: [name] Date of Service: 11/03/2015 9:30 AM Medical Record Number: [id number] Patient Account Number: [id number] Date of Birth/Gender: 1945/12/23 (70 y.o. Male) Treating RN: Ahmed Prima Primary Care Physician: [name] Other Clinician: Referring Physician: [name] Treating Physician/Extender: Frann Rider in Treatment: 5 Education Assessment Education Provided To: Patient Education Topics Provided Wound/Skin Impairment: Handouts: Other: do not get wrap wet Methods: Demonstration, Explain/Verbal Responses: State content correctly Electronic Signature(s) Signed: 11/03/2015 5:59:16 PM By: Alric Quan Entered By: Alric Quan on 11/03/2015 09:58:15 [name] ([id number]) -------------------------------------------------------------------------------- Wound Assessment Details Patient Name: [name] Date of Service: 11/03/2015 9:30 AM Medical Record Number: [id number] Patient Account Number: [id number] Date of Birth/Sex: 01/21/1946 (70 y.o. Male) Treating RN: Ahmed Prima Primary Care Physician: [name] Other Clinician: Referring Physician: [name] Treating Physician/Extender: Frann Rider in Treatment: 5 Wound Status Wound Number: 1 Primary Trauma, Other Etiology: Wound Location: Left Lower Leg - Midline, Anterior Wound Open Status: Wounding Event: Trauma Comorbid Cataracts, Glaucoma, Anemia, Date Acquired: 07/30/2015 History: Arrhythmia, Type II Diabetes, Weeks Of Treatment: 5 Osteoarthritis, Neuropathy Clustered Wound: No Photos Photo Uploaded By: Alric Quan on 11/03/2015 11:40:30 Wound Measurements Length: (cm) 1.2 Width: (cm) 0.6 Depth: (cm) 0.2 Area: (cm) 0.565 Volume: (cm) 0.113 % Reduction in Area: 82% % Reduction in  Volume: 88% Epithelialization: Small (1-33%) Tunneling: No Undermining: No Wound Description Full Thickness Without Exposed Foul Odor A Classification: Support Structures Diabetic Severity Grade 2 (Wagner): Wound Margin: Flat and Intact Exudate Amount: Large Exudate Type: Serosanguineous Exudate Color: red, brown fter Cleansing: No Wound Bed Granulation Amount: Large (67-100%) Exposed Structure [name], [name] ([id number]) Granulation Quality: Red, Pink Fascia Exposed: No Necrotic Amount: Small (1-33%) Fat Layer Exposed: No Necrotic Quality: Adherent Slough Tendon Exposed: No Muscle Exposed: No Joint Exposed: No Bone Exposed: No Limited to Skin Breakdown Periwound Skin Texture Texture Color No Abnormalities Noted: No No Abnormalities Noted: No Localized Edema: Yes Erythema: No Moisture Temperature / Pain No Abnormalities Noted: No Temperature: No Abnormality Moist: Yes Wound Preparation Ulcer Cleansing: Other: soap and water, Topical Anesthetic Applied: Other: lidocaine 4%, Treatment Notes Wound #1 (Left, Midline, Anterior Lower Leg) 1. Cleansed with: Cleanse wound with antibacterial soap and water 2. Anesthetic Topical Lidocaine 4% cream to wound bed prior to debridement 4. Dressing Applied: Prisma Ag 5. Secondary Dressing Applied ABD Pad Dry Gauze 7. Secured with Tape 2 Layer Lite Compression System - Left Lower Extremity Notes gauze and kerlix from toes to 3cm below the knee Electronic Signature(s) Signed: 11/03/2015 5:59:16 PM By: Alric Quan Entered By: Alric Quan on 11/03/2015 09:43:28 [name] ([id number]) -------------------------------------------------------------------------------- Vitals Details Patient Name: [name] Date of Service: 11/03/2015 9:30 AM Medical Record Number: [id number] Patient Account Number: [id number] Date of Birth/Sex: [birth year] (70 y.o. Male) Treating RN: Ahmed Prima Primary Care  Physician: [name] Other Clinician: Referring Physician: [name] Treating Physician/Extender: Frann Rider in Treatment: 5 Vital Signs Time Taken: 09:32 Temperature (F): 98.0 Height (in): 68 Pulse (bpm): 72 Weight (lbs): 178 Respiratory Rate (breaths/min): 20 Body Mass Index (BMI): 27.1 Blood Pressure (mmHg): 148/96 Reference Range: 80 - 120 mg / dl Electronic Signature(s) Signed: 11/03/2015 5:59:16 PM By: Alric Quan Entered By: Alric Quan on 11/03/2015 09:34:27

## 2015-11-04 NOTE — Progress Notes (Signed)
LINDLE, MARMOLEJOS ([id number]) Visit Report for 11/03/2015 Chief Complaint Document Details Patient Name: [name], [name] Date of Service: 11/03/2015 9:30 AM Medical Record Number: [id number] Patient Account Number: [id number] Date of Birth/Sex: [birth year] (70 y.o. Male) Treating RN: Ahmed Prima Primary Care Physician: [name] Other Clinician: Referring Physician: [name] Treating Physician/Extender: Frann Rider in Treatment: 5 Information Obtained from: Patient Chief Complaint Patient presents to the wound care center for a consult due non healing wound to the left lower extremity for the last 2 months. Electronic Signature(s) Signed: 11/03/2015 9:56:55 AM By: Christin Fudge MD, FACS Entered By: Christin Fudge on 11/03/2015 09:56:55 JAHCARI, [place] ([id number]) -------------------------------------------------------------------------------- Debridement Details Patient Name: [name] Date of Service: 11/03/2015 9:30 AM Medical Record Number: [id number] Patient Account Number: [id number] Date of Birth/Sex: [birth year] (70 y.o. Male) Treating RN: Ahmed Prima Primary Care Physician: [name] Other Clinician: Referring Physician: [name] Treating Physician/Extender: Frann Rider in Treatment: 5 Debridement Performed for Wound #1 Left,Midline,Anterior Lower Leg Assessment: Performed By: Physician Christin Fudge, MD Debridement: Debridement Pre-procedure Yes Verification/Time Out Taken: Start Time: 09:52 Pain Control: Other : lidocaine 4% cream Level: Skin/Subcutaneous Tissue Total Area Debrided (L x 1.2 (cm) x 0.6 (cm) = 0.72 (cm) W): Tissue and other Viable, Non-Viable, Exudate, Fibrin/Slough, Subcutaneous material debrided: Instrument: Forceps Bleeding: Minimum Hemostasis Achieved: Pressure End Time: 09:54 Procedural Pain: 0 Post Procedural Pain: 0 Response to Treatment: Procedure was tolerated  well Post Debridement Measurements of Total Wound Length: (cm) 1.2 Width: (cm) 0.6 Depth: (cm) 0.2 Volume: (cm) 0.113 Post Procedure Diagnosis Same as Pre-procedure Electronic Signature(s) Signed: 11/03/2015 9:56:49 AM By: Christin Fudge MD, FACS Signed: 11/03/2015 5:59:16 PM By: Alric Quan Entered By: Christin Fudge on 11/03/2015 09:56:49 [name] ([id number]) -------------------------------------------------------------------------------- HPI Details Patient Name: [name] Date of Service: 11/03/2015 9:30 AM Medical Record Number: [id number] Patient Account Number: [id number] Date of Birth/Sex: 1946/01/13 (70 y.o. Male) Treating RN: Ahmed Prima Primary Care Physician: [name] Other Clinician: Referring Physician: [name] Treating Physician/Extender: Frann Rider in Treatment: 5 History of Present Illness Location: left lower leg anteriorly on his shin Quality: Patient reports experiencing a dull pain to affected area(s). Severity: Patient states wound are getting worse. Duration: Patient has had the wound for > 2 months prior to seeking treatment at the wound center Timing: Pain in wound is Intermittent (comes and goes Context: The wound occurred when the patient had a blunt injury with a golf cart 2 months ago Modifying Factors: Other treatment(s) tried include: he has been applying local ointments to this and once the scab came out he noticed a deep ulceration. Associated Signs and Symptoms: Patient reports having difficulty standing for long periods. HPI Description: 70 year old gentleman who was recently seen by his nephrologist [name], and noted to have a wound on his left lower extremity which was lacerated 2 months ago and now has reopened. The patient's left shin has a ulceration with some exudate but no evidence of infection and he was referred to [place] for further care as it was known that the patient has  had some peripheral vascular disease in the past. Past medical history significant for chronic kidney disease, atrial fibrillation, diabetes mellitus,status post kidney transplant in 1983 and 2005, a week fistula graft placement, status post previous bowel surgery. 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 10/06/2015 -- x-ray of the left tibia and fibula shows no evidence of osteomyelitis. The patient has also had  Doppler studies of his extremity and is awaiting the appointment with the vascular surgeon. We have not yet received these reports. 10/13/2015 -- lower extremity venous duplex reflux evaluation shows reflux in the left common femoral vein, left saphenofemoral junction and the proximal greater saphenous vein extending to the proximal calf. There is also reflux in the left proximal to mid small saphenous vein. Arterial duplex studies done showed the resting ABI was not applicable due to tibial artery medial calcification. 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 The right TBI was 1 which is normal and the left ABI was 1 which is normal. Patient has otherwise been doing fine and has been compliant with his dressings. 10/20/2015 -- He was seen by [name] recently for a vascular opinion on 10/15/2015. His left lower extremity venous insufficiency duplex study revealed GSV reflux,SS vein reflux and deep venous reflux in the common femoral vein. His ABIs were non compressible and his TBI on the right was 1.01 and on the left was 0.80. He was asked to continue with the wound care with compressive therapy followed by EVLA of the left GS vein 3 months. He recommended 20-30 mm thigh-high compression stockings and the need for a three- month trial of this. the patient had an Unna boot applied at the vascular office but he could not  tolerate this with a lot of pain Puckett, Toribio ([id number]) and issues with his toes and hence came here on Friday for removal of this and we reapplied a 2 layer compression. Electronic Signature(s) Signed: 11/03/2015 9:57:01 AM By: Christin Fudge MD, FACS Entered By: Christin Fudge on 11/03/2015 09:57:01 TOLIVER, TRUSH ([id number]) -------------------------------------------------------------------------------- Physical Exam Details Patient Name: [name] Date of Service: 11/03/2015 9:30 AM Medical Record Number: [id number] Patient Account Number: [id number] Date of Birth/Sex: April 09, 1946 (70 y.o. Male) Treating RN: Ahmed Prima Primary Care Physician: [name] Other Clinician: Referring Physician: [name] Treating Physician/Extender: Frann Rider in Treatment: 5 Constitutional . Pulse regular. Respirations normal and unlabored. Afebrile. . Eyes Nonicteric. Reactive to light. Ears, Nose, Mouth, and Throat Lips, teeth, and gums WNL.Marland Kitchen Moist mucosa without lesions. Neck supple and nontender. No palpable supraclavicular or cervical adenopathy. Normal sized without goiter. Respiratory WNL. No retractions.. Cardiovascular Pedal Pulses WNL. No clubbing, cyanosis or edema. Lymphatic No adneopathy. No adenopathy. No adenopathy. Musculoskeletal Adexa without tenderness or enlargement.. Digits and nails w/o clubbing, cyanosis, infection, petechiae, ischemia, or inflammatory conditions.. Integumentary (Hair, Skin) No suspicious lesions. No crepitus or fluctuance. No peri-wound warmth or erythema. No masses.Marland Kitchen Psychiatric Judgement and insight Intact.. No evidence of depression, anxiety, or agitation.. Notes the wound has got healthy granulation tissue but towards the medial part of the lower edge she's got quite a bit of slough which was sharply debrided down to subcutaneous in his tissue with a forcep gauze and saline. Electronic  Signature(s) Signed: 11/03/2015 9:57:51 AM By: Christin Fudge MD, FACS Entered By: Christin Fudge on 11/03/2015 09:57:51 LUDIE, SCHRAUTH ([id number]) -------------------------------------------------------------------------------- Physician Orders Details Patient Name: [name] Date of Service: 11/03/2015 9:30 AM Medical Record Number: [id number] Patient Account Number: [id number] Date of Birth/Sex: 07-19-1946 (70 y.o. Male) Treating RN: Ahmed Prima Primary Care Physician: [name] Other Clinician: Referring Physician: [name] Treating Physician/Extender: Frann Rider in Treatment: 5 Verbal / Phone Orders: Yes ClinicianCarolyne Fiscal, Debi Read Back and Verified: Yes Diagnosis Coding Wound Cleansing Wound #1 Left,Midline,Anterior Lower Leg   o Cleanse wound with mild soap and water o May Shower, gently pat wound dry prior to applying new dressing. Anesthetic Wound #1 Left,Midline,Anterior Lower Leg o Topical Lidocaine 4% cream applied to wound bed prior to debridement Primary Wound Dressing Wound #1 Left,Midline,Anterior Lower Leg o Prisma Ag Secondary Dressing Wound #1 Left,Midline,Anterior Lower Leg o ABD pad o Dry Gauze Dressing Change Frequency Wound #1 Left,Midline,Anterior Lower Leg o Change dressing every week Follow-up Appointments Wound #1 Left,Midline,Anterior Lower Leg o Return Appointment in 1 week. Edema Control Wound #1 Left,Midline,Anterior Lower Leg o 2 Layer Compression System - Left Lower Extremity o Elevate legs to the level of the heart and pump ankles as often as possible Electronic Signature(s) Signed: 11/03/2015 4:26:55 PM By: Christin Fudge MD, FACS Signed: 11/03/2015 5:59:16 PM By: Annette Stable, [name] Entered By: Alric Quan on 11/03/2015 09:56:22 TOLIVER, TRUSH ([id number]) -------------------------------------------------------------------------------- Problem  List Details Patient Name: [name] Date of Service: 11/03/2015 9:30 AM Medical Record Number: [id number] Patient Account Number: [id number] Date of Birth/Sex: Dec 02, 1945 (71 y.o. 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/29/2015 Yes exposed I73.9 Peripheral vascular disease, unspecified 09/29/2015 Yes I87.312 Chronic venous hypertension (idiopathic) with ulcer of left 09/29/2015 Yes lower extremity Z94.0 Kidney transplant status 09/29/2015 Yes Inactive Problems Resolved Problems Electronic Signature(s) Signed: 11/03/2015 9:56:39 AM By: Christin Fudge MD, FACS Entered By: Christin Fudge on 11/03/2015 09:56:39 [name] ([id number]) -------------------------------------------------------------------------------- Progress Note Details Patient Name: [name] Date of Service: 11/03/2015 9:30 AM Medical Record Number: [id number] Patient Account Number: [id number] Date of Birth/Sex: 1946-06-25 (70 y.o. Male) Treating RN: Ahmed Prima Primary Care Physician: [name] Other Clinician: Referring Physician: [name] Treating Physician/Extender: Frann Rider in Treatment: 5 Subjective Chief Complaint Information obtained from Patient Patient presents to the wound care center for a consult due non healing wound to the left lower extremity for the last 2 months. History of Present Illness (HPI) The following HPI elements were documented for the patient's wound: Location: left lower leg anteriorly on his shin Quality: Patient reports experiencing a dull pain to affected  area(s). Severity: Patient states wound are getting worse. Duration: Patient has had the wound for > 2 months prior to seeking treatment at the wound center Timing: Pain in wound is Intermittent (comes and goes Context: The wound occurred when the patient had a blunt injury with a golf cart 2 months ago Modifying Factors: Other treatment(s) tried include: he has been applying local ointments to this and once the scab came out he noticed a deep ulceration. Associated Signs and Symptoms: Patient reports having difficulty standing for long periods. 70 year old gentleman who was recently seen by his nephrologist [name], and noted to have a wound on his left lower extremity which was lacerated 2 months ago and now has reopened. The patient's left shin has a ulceration with some exudate but no evidence of infection and he was referred to [place] for further care as it was known that the patient has had some peripheral vascular disease in the past. Past medical history significant for chronic kidney disease, atrial fibrillation, diabetes mellitus,status post kidney transplant in 1983 and 2005, a week fistula graft placement, status post previous bowel surgery. 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 10/06/2015 -- x-ray of the left tibia and fibula shows no evidence of osteomyelitis. The patient has also had Doppler studies of his extremity and is awaiting the appointment with the vascular surgeon. We have not yet received these reports. 10/13/2015 -- lower extremity venous duplex reflux evaluation shows reflux in the left common femoral vein, left saphenofemoral junction and the proximal greater saphenous vein extending to the proximal calf. There is also reflux in the left proximal to mid small saphenous vein. Arterial duplex studies done showed the resting ABI was not applicable due to tibial artery medial calcification. 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 The right TBI was 1 which is normal and the left ABI was 1 which is normal. Patient has otherwise been doing fine and has been compliant with his dressings. 10/20/2015 -- He was seen by [name] recently for a vascular opinion on 10/15/2015. His left lower extremity venous insufficiency duplex study revealed GSV reflux,SS vein reflux and deep venous reflux in Archie, [name] the common femoral vein. His ABIs were non compressible and his TBI on the right was 1.01 and on the left was 0.80. He was asked to continue with the wound care with compressive therapy followed by EVLA of the left GS vein 3 months. He recommended 20-30 mm thigh-high compression stockings and the need for a three- month trial of this. the patient had an Unna boot applied at the vascular office but he could not tolerate this with a lot of pain and issues with his toes and hence came here on Friday for removal of this and we reapplied a 2 layer compression. Objective Constitutional Pulse regular. Respirations normal and unlabored. Afebrile. Vitals Time Taken: 9:32 AM, Height: 68 in, Weight: 178 lbs, BMI: 27.1, Temperature: 98.0 F, Pulse: 72 bpm, Respiratory Rate: 20 breaths/min, Blood Pressure: 148/96 mmHg. Eyes Nonicteric. Reactive to light. Ears, Nose, Mouth, and Throat Lips, teeth, and gums WNL.Marland Kitchen Moist mucosa without lesions. Neck supple and nontender. No palpable supraclavicular or cervical adenopathy. Normal sized without goiter. Respiratory WNL. No retractions.. Cardiovascular Pedal Pulses WNL. No clubbing, cyanosis or edema. Lymphatic No adneopathy. No adenopathy. No adenopathy. Musculoskeletal Adexa without tenderness or enlargement.. Digits and nails w/o clubbing, cyanosis, infection, petechiae, ischemia, or inflammatory  conditions.Marland Kitchen Psychiatric Judgement and insight Intact.. No evidence of depression, anxiety, or agitation.Joneen Caraway, [name] General Notes: the wound has got healthy granulation tissue but towards the medial part of the lower edge she's got quite a bit of slough which was sharply debrided down to subcutaneous in his tissue with a forcep gauze and saline. Integumentary (Hair, Skin) No suspicious lesions. No crepitus or fluctuance. No peri-wound warmth or erythema. No masses.. Wound #1 status is Open. Original cause of wound was Trauma. The wound is located on the Left,Midline,Anterior Lower Leg. The wound measures 1.2cm length x 0.6cm width x 0.2cm depth; 0.565cm^2 area and 0.113cm^3 volume. The wound is limited to skin breakdown. There is no tunneling or undermining noted. There is a large amount of serosanguineous drainage noted. The wound margin is flat and intact. There is large (67-100%) red, pink granulation within the wound bed. There is a small (1-33%) amount of necrotic tissue within the wound bed including Adherent Slough. The periwound skin appearance exhibited: Localized Edema, Moist. The periwound skin appearance did  not exhibit: Erythema. Periwound temperature was noted as No Abnormality. Assessment Active Problems ICD-10 E11.622 - Type 2 diabetes mellitus with other skin ulcer L97.222 - Non-pressure chronic ulcer of left calf with fat layer exposed I73.9 - Peripheral vascular disease, unspecified I87.312 - Chronic venous hypertension (idiopathic) with ulcer of left lower extremity Z94.0 - Kidney transplant status We are going to use Prisma AG and a 2 layer compression wrap as this is all he can tolerate. I have also discussed with him the need to get compression stockings which was supposed to be ordered by his vascular surgeon and they wanted the thigh-high variety. I will let it be prescribed by his vascular surgeon and we will continue with local compression  wraps until the wound heals. Procedures Wound #1 Wound #1 is a Trauma, Other located on the Left,Midline,Anterior Lower Leg . There was a Skin/Subcutaneous Tissue Debridement [id number]) debridement with total area of 0.72 sq cm performed by Christin Fudge, MD. with the following instrument(s): Forceps to remove Viable and Non-Viable tissue/material including Exudate, Fibrin/Slough, and Subcutaneous after achieving pain control using Other (lidocaine 4% cream). A time out was conducted prior to the start of the procedure. A Minimum amount of bleeding was controlled with Pressure. The procedure was tolerated well with a pain level of 0 throughout [place] ([id number]) and a pain level of 0 following the procedure. Post Debridement Measurements: 1.2cm length x 0.6cm width x 0.2cm depth; 0.113cm^3 volume. Post procedure Diagnosis Wound #1: Same as Pre-Procedure Plan Wound Cleansing: Wound #1 Left,Midline,Anterior Lower Leg: Cleanse wound with mild soap and water May Shower, gently pat wound dry prior to applying new dressing. Anesthetic: Wound #1 Left,Midline,Anterior Lower Leg: Topical Lidocaine 4% cream applied to wound bed prior to debridement Primary Wound Dressing: Wound #1 Left,Midline,Anterior Lower Leg: Prisma Ag Secondary Dressing: Wound #1 Left,Midline,Anterior Lower Leg: ABD pad Dry Gauze Dressing Change Frequency: Wound #1 Left,Midline,Anterior Lower Leg: Change dressing every week Follow-up Appointments: Wound #1 Left,Midline,Anterior Lower Leg: Return Appointment in 1 week. Edema Control: Wound #1 Left,Midline,Anterior Lower Leg: 2 Layer Compression System - Left Lower Extremity Elevate legs to the level of the heart and pump ankles as often as possible We are going to use Prisma AG and a 2 layer compression wrap as this is all he can tolerate. I have also discussed with him the need to get compression stockings which was supposed to be ordered by  his vascular surgeon and they wanted the thigh-high variety. I will let it be prescribed by his vascular surgeon and we will continue with local compression wraps until the wound heals. Electronic Signature(s) Signed: 11/03/2015 9:58:11 AM By: Christin Fudge MD, FACS [place], [name] Entered By: Christin Fudge on 11/03/2015 09:58:11 AVISH, CONSIDINE ([id number]) -------------------------------------------------------------------------------- SuperBill Details Patient Name: [name] Date of Service: 11/03/2015 Medical Record Number: [id number] Patient Account Number: [id number] Date of Birth/Sex: 1945/10/11 (70 y.o. Male) Treating RN: Ahmed Prima Primary Care Physician: [name] Other Clinician: Referring Physician: [name] Treating Physician/Extender: Frann Rider in Treatment: 5 Diagnosis Coding ICD-10 Codes Code Description E11.622 Type 2 diabetes mellitus with other skin 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er exposed I73.9 Peripheral vascular disease, unspecified I87.312 Chronic venous hypertension (idiopathic) with ulcer of left lower extremity Z94.0 Kidney transplant status Facility Procedures CPT4 Code Description: [id number] - DEB SUBQ TISSUE 20 SQ CM/< ICD-10 Description Diagnosis E11.622 Type 2 diabetes mellitus with other skin ulcer L97.222 Non-pressure chronic ulcer of left calf with fat  lay I73.9 Peripheral vascular disease,  unspecified I87.312 Chronic venous hypertension (idiopathic) with ulcer Modifier: er exposed of left lower Quantity: 1 extremity Physician Procedures CPT4 Code Description: [id number] - WC PHYS SUBQ TISS 20 SQ CM ICD-10 Description Diagnosis E11.622 Type 2 diabetes mellitus with other skin 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 I73.9 Peripheral vascular disease,  unspecified I87.312 Chronic venous hypertension (idiopathic) with  ulcer Modifier: er exposed of left lower Quantity: 1 extremity Electronic Signature(s) Signed: 11/03/2015 9:58:31 AM By: Christin Fudge MD, FACS Entered By: Christin Fudge on 11/03/2015 09:58:30

## 2015-11-10 ENCOUNTER — Encounter: Payer: Medicare Other | Admitting: Surgery

## 2015-11-10 DIAGNOSIS — L97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er exposed: Secondary | ICD-10-CM | POA: Diagnosis not present

## 2015-11-10 DIAGNOSIS — S81802A Unspecified open wound, left lower leg, initial encounter: Secondary | ICD-10-CM | POA: Diagnosis not present

## 2015-11-10 DIAGNOSIS — I739 Peripheral vascular disease, unspecified: Secondary | ICD-10-CM | POA: Diagnosis not present

## 2015-11-10 DIAGNOSIS — E11622 Type 2 diabetes mellitus with other skin ulcer: Secondary | ICD-10-CM | POA: Diagnosis not present

## 2015-11-10 DIAGNOSIS — E1122 Type 2 diabetes mellitus with diabetic chronic kidney disease: Secondary | ICD-10-CM | POA: Diagnosis not present

## 2015-11-10 DIAGNOSIS — I87312 Chronic venous hypertension (idiopathic) with ulcer of left lower extremity: Secondary | ICD-10-CM | POA: Diagnosis not present

## 2015-11-10 DIAGNOSIS — Z940 Kidney transplant status: Secondary | ICD-10-CM | POA: Diagnosis not present

## 2015-11-11 NOTE — Progress Notes (Signed)
Michael, [name] ([id number]) Visit Report for 11/10/2015 Chief Complaint Document Details Patient Name: [name], [name] Date of Service: 11/10/2015 9:30 AM Medical Record Number: [id number] Patient Account Number: [id number] Date of Birth/Sex: April 29, 1946 (70 y.o. Male) Treating RN: Macarthur Critchley Primary Care Physician: [name] Other Clinician: Referring Physician: [name] Treating Physician/Extender: Frann Rider in Treatment: 6 Information Obtained from: Patient Chief Complaint Patient presents to the wound care center for a consult due non healing wound to the left lower extremity for the last 2 months. Electronic Signature(s) Signed: 11/10/2015 9:55:18 AM By: Christin Fudge MD, FACS Entered By: Christin Fudge on 11/10/2015 09:55:18 URA, SLAIGHT ([id number]) -------------------------------------------------------------------------------- HPI Details Patient Name: [name] Date of Service: 11/10/2015 9:30 AM Medical Record Number: [id number] Patient Account Number: [id number] Date of Birth/Sex: 01/20/1946 (70 y.o. Male) Treating RN: Macarthur Critchley Primary Care Physician: [name] Other Clinician: Referring Physician: [name] Treating Physician/Extender: Frann Rider in Treatment: 6 History of Present Illness Location: left lower leg anteriorly on his shin Quality: Patient reports experiencing a dull pain to affected area(s). Severity: Patient states wound are getting worse. Duration: Patient has had the wound for > 2 months prior to seeking treatment at the wound center Timing: Pain in wound is Intermittent (comes and goes Context: The wound occurred when the patient had a blunt injury with a golf cart 2 months ago Modifying Factors: Other treatment(s) tried include: he has been applying local ointments to this and once the scab came out he noticed a deep ulceration. Associated Signs and Symptoms: Patient  reports having difficulty standing for long periods. HPI Description: 70 year old gentleman who was recently seen by his nephrologist [name], and noted to have a wound on his left lower extremity which was lacerated 2 months ago and now has reopened. The patient's left shin has a ulceration with some exudate but no evidence of infection and he was referred to [place] for further care as it was known that the patient has had some peripheral vascular disease in the past. Past medical history significant for chronic kidney disease, atrial fibrillation, diabetes mellitus,status post kidney transplant in 1983 and 2005, a week fistula graft placement, status post previous bowel surgery. 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 10/06/2015 -- x-ray of the left tibia and fibula shows no evidence of osteomyelitis. The patient has also had Doppler studies of his extremity and is awaiting the appointment with the vascular surgeon. We have not yet received these reports. 10/13/2015 -- lower extremity venous duplex reflux evaluation shows reflux in the left common femoral vein, left saphenofemoral junction and the proximal greater saphenous vein extending to the proximal calf. There is also reflux in the left proximal to mid small saphenous vein. Arterial duplex studies done showed the resting ABI was not applicable due to tibial artery medial calcification. 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 The right TBI was 1 which is normal and the left ABI was 1 which is normal. Patient has otherwise been doing fine and has been compliant with his dressings. 10/20/2015 -- He was seen by [name] recently for a vascular opinion on 10/15/2015. His left lower extremity venous insufficiency duplex study revealed GSV reflux,SS vein reflux and  deep venous reflux in the common femoral vein. His ABIs were non compressible and his TBI on the right  was 1.01 and on the left was 0.80. He was asked to continue with the wound care with compressive therapy followed by EVLA of the left GS vein 3 months. He recommended 20-30 mm thigh-high compression stockings and the need for a three- month trial of this. The patient had an Unna boot applied at the vascular office but he could not tolerate this with a lot of pain and issues with his toes and hence came here on Friday for removal of this and we reapplied a 2 layer Pino, Caspian ([id number]) compression. 11/10/2015 -- patient still has not purchased his 20-30 mm thigh-high compression stockings as prescribed by [name]. Electronic Signature(s) Signed: 11/10/2015 9:56:07 AM By: Christin Fudge MD, FACS Entered By: Christin Fudge on 11/10/2015 09:56:07 COSTANTINO, ROSSBERG ([id number]) -------------------------------------------------------------------------------- Physical Exam Details Patient Name: [name] Date of Service: 11/10/2015 9:30 AM Medical Record Number: [id number] Patient Account Number: [id number] Date of Birth/Sex: 08-14-1946 (70 y.o. Male) Treating RN: Macarthur Critchley Primary Care Physician: [name] Other Clinician: Referring Physician: [name] Treating Physician/Extender: Frann Rider in Treatment: 6 Constitutional . Pulse regular. Respirations normal and unlabored. Afebrile. . Eyes Nonicteric. Reactive to light. Ears, Nose, Mouth, and Throat Lips, teeth, and gums WNL.Marland Kitchen Moist mucosa without lesions. Neck supple and nontender. No palpable supraclavicular or cervical adenopathy. Normal sized without goiter. Respiratory WNL. No retractions.. Cardiovascular Pedal Pulses WNL. No clubbing, cyanosis or edema. Lymphatic No adneopathy. No adenopathy. No adenopathy. Musculoskeletal Adexa without tenderness or enlargement.. Digits and nails  w/o clubbing, cyanosis, infection, petechiae, ischemia, or inflammatory conditions.. Integumentary (Hair, Skin) No suspicious lesions. No crepitus or fluctuance. No peri-wound warmth or erythema. No masses.Marland Kitchen Psychiatric Judgement and insight Intact.. No evidence of depression, anxiety, or agitation.. Notes the wound is looking much better and some superficial debris was washed out with saline gauze and there was no deep debridement required. Electronic Signature(s) Signed: 11/10/2015 9:56:38 AM By: Christin Fudge MD, FACS Entered By: Christin Fudge on 11/10/2015 09:56:38 JAKOBY, VANDYKEN ([id number]) -------------------------------------------------------------------------------- Physician Orders Details Patient Name: [name] Date of Service: 11/10/2015 9:30 AM Medical Record Number: [id number] Patient Account Number: [id number] Date of Birth/Sex: 1945-12-26 (70 y.o. Male) Treating RN: Macarthur Critchley Primary Care Physician: [name] Other Clinician: Referring Physician: [name] Treating Physician/Extender: Frann Rider in Treatment: 6 Verbal / Phone Orders: Yes Clinician: Macarthur Critchley Read Back and Verified: Yes Diagnosis Coding Wound Cleansing Wound #1 Left,Midline,Anterior Lower Leg o May shower with protection. Skin Barriers/Peri-Wound Care Wound #1 Left,Midline,Anterior Lower Leg o Barrier cream Primary Wound Dressing Wound #1 Left,Midline,Anterior Lower Leg o Prisma Ag Secondary Dressing Wound #1 Left,Midline,Anterior Lower Leg o ABD pad o Dry Gauze Dressing Change Frequency Wound #1 Left,Midline,Anterior Lower Leg o Change dressing every week Follow-up Appointments Wound #1 Left,Midline,Anterior Lower Leg o Return Appointment in 1 week. Edema Control Wound #1 Left,Midline,Anterior Lower Leg o 2 Layer Compression System - Left Lower Extremity o Elevate legs to the level of the heart and pump ankles as often  as possible Electronic Signature(s) Signed: 11/10/2015 2:58:24 PM By: Christin Fudge MD, FACS Signed: 11/10/2015 4:54:11 PM By: Rebecca Eaton RN, Shirline Frees, Ada ([id number]) Entered By: Rebecca Eaton RN, Sendra on 11/10/2015 09:54:36 [name], YECK ([id number]) -------------------------------------------------------------------------------- Problem List Details Patient Name: [name] Date of Service: 11/10/2015 9:30 AM Medical Record Number: [id number] Patient Account Number: [id number] Date of Birth/Sex: 1946/06/11 (70 y.o. 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/29/2015 Yes exposed I73.9 Peripheral vascular disease, unspecified 09/29/2015 Yes I87.312 Chronic venous hypertension (idiopathic) with ulcer of left 09/29/2015 Yes lower extremity Z94.0 Kidney transplant status 09/29/2015 Yes Inactive Problems Resolved Problems Electronic Signature(s) Signed: 11/10/2015 9:55:11 AM By: Christin Fudge MD, FACS Entered By: Christin Fudge on 11/10/2015 09:55:11 [name] ([id number]) -------------------------------------------------------------------------------- Progress Note Details Patient Name: [name] Date of Service: 11/10/2015 9:30 AM Medical Record Number: [id number] Patient Account Number: [id number] Date of Birth/Sex: 20-Jan-1946 (70 y.o. Male) Treating RN: Macarthur Critchley Primary Care Physician: [name] Other Clinician: Referring Physician: [name] Treating Physician/Extender: Frann Rider in Treatment: 6 Subjective Chief Complaint Information obtained from  Patient Patient presents to the wound care center for a consult due non healing wound to the left lower extremity for the last 2 months. History of Present Illness (HPI) The following HPI elements were documented for the patient's wound: Location: left lower leg anteriorly on his shin Quality: Patient reports experiencing a dull pain to affected area(s). Severity: Patient states wound are getting worse. Duration: Patient has had the wound for > 2 months prior to seeking treatment at the wound center Timing: Pain in wound is Intermittent (comes and goes Context: The wound occurred when the patient had a blunt injury with a golf cart 2 months ago Modifying Factors: Other treatment(s) tried include: he has been applying local ointments to this and once the scab came out he noticed a deep ulceration. Associated Signs and Symptoms: Patient reports having difficulty standing for long periods. 70 year old gentleman who was recently seen by his nephrologist [name], and noted to have a wound on his left lower extremity which was lacerated 2 months ago and now has reopened. The patient's left shin has a ulceration with some exudate but no evidence of infection and he was referred to [place] for further care as it was known that the patient has had some peripheral vascular disease in the past. Past medical history significant for chronic kidney disease, atrial fibrillation, diabetes mellitus,status post kidney transplant in 1983 and 2005, a week fistula graft placement, status post previous bowel surgery. 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 10/06/2015 -- x-ray of the left tibia and fibula shows no evidence of osteomyelitis. The patient has also had Doppler studies of his extremity and is awaiting the appointment with the vascular surgeon. We have not yet received these reports. 10/13/2015 -- lower extremity venous duplex reflux evaluation shows reflux  in the left common femoral vein, left saphenofemoral junction and the proximal greater saphenous vein extending to the proximal calf. There is also reflux in the left proximal to mid small saphenous vein. Arterial duplex studies done showed the resting ABI was not applicable due to tibial artery medial calcification. 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 The right TBI was 1 which is normal and the left ABI was 1 which is normal. Patient has otherwise been doing fine and has been compliant with his dressings. 10/20/2015 -- He was seen by [name] recently for a vascular opinion on 10/15/2015. His left lower extremity venous insufficiency duplex study revealed GSV reflux,SS vein reflux and deep venous reflux in [place],  Errin ([id number]) the common femoral vein. His ABIs were non compressible and his TBI on the right was 1.01 and on the left was 0.80. He was asked to continue with the wound care with compressive therapy followed by EVLA of the left GS vein 3 months. He recommended 20-30 mm thigh-high compression stockings and the need for a three- month trial of this. The patient had an Unna boot applied at the vascular office but he could not tolerate this with a lot of pain and issues with his toes and hence came here on Friday for removal of this and we reapplied a 2 layer compression. 11/10/2015 -- patient still has not purchased his 20-30 mm thigh-high compression stockings as prescribed by [name]. Objective Constitutional Pulse regular. Respirations normal and unlabored. Afebrile. Vitals Time Taken: 9:40 AM, Height: 68 in, Weight: 178 lbs, BMI: 27.1, Temperature: 97.9 F, Pulse: 78 bpm, Respiratory Rate: 20 breaths/min, Blood Pressure: 158/91 mmHg. Eyes Nonicteric. Reactive to light. Ears, Nose, Mouth, and Throat Lips, teeth, and gums WNL.Marland Kitchen Moist  mucosa without lesions. Neck supple and nontender. No palpable supraclavicular or cervical adenopathy. Normal sized without goiter. Respiratory WNL. No retractions.. Cardiovascular Pedal Pulses WNL. No clubbing, cyanosis or edema. Lymphatic No adneopathy. No adenopathy. No adenopathy. Musculoskeletal Adexa without tenderness or enlargement.. Digits and nails w/o clubbing, cyanosis, infection, petechiae, ischemia, or inflammatory conditions.Marland Kitchen Psychiatric Judgement and insight Intact.. No evidence of depression, anxiety, or agitation.Joneen Caraway, [name] General Notes: the wound is looking much better and some superficial debris was washed out with saline gauze and there was no deep debridement required. Integumentary (Hair, Skin) No suspicious lesions. No crepitus or fluctuance. No peri-wound warmth or erythema. No masses.. Wound #1 status is Open. Original cause of wound was Trauma. The wound is located on the Left,Midline,Anterior Lower Leg. The wound measures 0.9cm length x 0.5cm width x 0.2cm depth; 0.353cm^2 area and 0.071cm^3 volume. The wound is limited to skin breakdown. There is no tunneling or undermining noted. There is a medium amount of serosanguineous drainage noted. The wound margin is flat and intact. There is large (67-100%) red, pink granulation within the wound bed. There is a small (1-33%) amount of necrotic tissue within the wound bed including Adherent Slough. The periwound skin appearance exhibited: Localized Edema, Moist. The periwound skin appearance did not exhibit: Erythema. Periwound temperature was noted as No Abnormality. Assessment Active Problems ICD-10 E11.622 - Type 2 diabetes mellitus with other skin ulcer L97.222 - Non-pressure chronic ulcer of left calf with fat layer exposed I73.9 - Peripheral vascular disease, unspecified I87.312 - Chronic venous hypertension (idiopathic) with ulcer of left lower extremity Z94.0 - Kidney transplant  status Plan Wound Cleansing: Wound #1 Left,Midline,Anterior Lower Leg: May shower with protection. Skin Barriers/Peri-Wound Care: Wound #1 Left,Midline,Anterior Lower Leg: Barrier cream Primary Wound Dressing: Wound #1 Left,Midline,Anterior Lower Leg: Prisma Ag Secondary Dressing: Wound #1 Left,Midline,Anterior Lower Leg: ABD pad Swartz, Aul ([id number]) Dry Gauze Dressing Change Frequency: Wound #1 Left,Midline,Anterior Lower Leg: Change dressing every week Follow-up Appointments: Wound #1 Left,Midline,Anterior Lower Leg: Return Appointment in 1 week. Edema Control: Wound #1 Left,Midline,Anterior Lower Leg: 2 Layer Compression System - Left Lower Extremity Elevate legs to the level of the heart and pump ankles as often as possible We are going to use Prisma AG and a 2 layer compression wrap as this is all he can tolerate. I have also discussed with him the need to get compression stockings which was supposed to be ordered by his vascular surgeon and  they wanted the thigh-high variety. I will let it be prescribed by his vascular surgeon and we will continue with local compression wraps until the wound heals. Electronic Signature(s) Signed: 11/10/2015 9:56:54 AM By: Christin Fudge MD, FACS Entered By: Christin Fudge on 11/10/2015 09:56:54 AKAI, WEATHERBY ([id number]) -------------------------------------------------------------------------------- SuperBill Details Patient Name: [name] Date of Service: 11/10/2015 Medical Record Number: [id number] Patient Account Number: [id number] Date of Birth/Sex: 12/06/1945 (70 y.o. Male) Treating RN: Macarthur Critchley Primary Care Physician: [name] Other Clinician: Referring Physician: [name] Treating Physician/Extender: Frann Rider in Treatment: 6 Diagnosis Coding ICD-10 Codes Code Description E11.622 Type 2 diabetes mellitus with other skin ulcer L97.222 Non-pressure chronic ulcer of left  calf with fat layer exposed I73.9 Peripheral vascular disease, unspecified I87.312 Chronic venous hypertension (idiopathic) with ulcer of left lower extremity Z94.0 Kidney transplant status Facility Procedures CPT4 Code: [id number] Description: 99213 - WOUND CARE VISIT-LEV 3 EST PT Modifier: Quantity: 1 Physician Procedures CPT4 Code Description: [id number] - WC PHYS LEVEL 3 - EST PT ICD-10 Description Diagnosis E11.622 Type 2 diabetes mellitus with other skin ulcer L97.222 Non-pressure chronic ulcer of left calf with fat la I73.9 Peripheral vascular disease, unspecified  I87.312 Chronic venous hypertension (idiopathic) with ulcer Modifier: yer exposed of left lower Quantity: 1 extremity Electronic Signature(s) Signed: 11/10/2015 2:58:24 PM By: Christin Fudge MD, FACS Signed: 11/10/2015 4:54:11 PM By: Rebecca Eaton RN, Sendra Previous Signature: 11/10/2015 9:57:07 AM Version By: Christin Fudge MD, FACS Entered By: Rebecca Eaton RN, Roslynn Amble on 11/10/2015 10:48:23

## 2015-11-11 NOTE — Progress Notes (Signed)
SABINO, EVELYN ([id number]) Visit Report for 11/10/2015 Arrival Information Details Patient Name: [name], [name] Date of Service: 11/10/2015 9:30 AM Medical Record Number: [id number] Patient Account Number: [id number] Date of Birth/Sex: 26-Feb-1946 (70 y.o. Male) Treating RN: Macarthur Critchley Primary Care Physician: [name] Other Clinician: Referring Physician: [name] Treating Physician/Extender: Frann Rider in Treatment: 6 Visit Information History Since Last Visit All ordered tests and consults were completed: No Patient Arrived: Ambulatory Added or deleted any medications: No Arrival Time: 09:37 Any new allergies or adverse reactions: No Accompanied By: self Had a fall or experienced change in No Transfer Assistance: None activities of daily living that may affect Patient Identification Verified: Yes risk of falls: Secondary Verification Process Yes Signs or symptoms of abuse/neglect since last No Completed: visito Patient Requires Transmission- No Hospitalized since last visit: No Based Precautions: Has Dressing in Place as Prescribed: Yes Patient Has Alerts: Yes Pain Present Now: No Patient Alerts: DM II ASA non-compressible 09/29/15 Electronic Signature(s) Signed: 11/10/2015 4:54:11 PM By: Rebecca Eaton, RN, Sendra Entered By: Rebecca Eaton RN, Sendra on 11/10/2015 09:39:07 [name] ([id number]) -------------------------------------------------------------------------------- Clinic Level of Care Assessment Details Patient Name: [name] Date of Service: 11/10/2015 9:30 AM Medical Record Number: [id number] Patient Account Number: [id number] Date of Birth/Sex: [birth year] (70 y.o. Male) Treating RN: Macarthur Critchley Primary Care Physician: [name] Other Clinician: Referring Physician: [name] Treating Physician/Extender: Frann Rider in Treatment: 6 Clinic Level of Care Assessment Items TOOL 4  Quantity Score X - Use when only an EandM is performed on FOLLOW-UP visit 1 0 ASSESSMENTS - Nursing Assessment / Reassessment X - Reassessment of Co-morbidities (includes updates in patient status) 1 10 X - Reassessment of Adherence to Treatment Plan 1 5 ASSESSMENTS - Wound and Skin Assessment / Reassessment X - Simple Wound Assessment / Reassessment - one wound 1 5 []  - Complex Wound Assessment / Reassessment - multiple wounds 0 []  - Dermatologic / Skin Assessment (not related to wound area) 0 ASSESSMENTS - Focused Assessment X - Circumferential Edema Measurements - multi extremities 1 5 []  - Nutritional Assessment / Counseling / Intervention 0 X - Lower Extremity Assessment (monofilament, tuning fork, pulses) 1 5 []  - Peripheral Arterial Disease Assessment (using hand held doppler) 0 ASSESSMENTS - Ostomy and/or Continence Assessment and Care []  - Incontinence Assessment and Management 0 []  - Ostomy Care Assessment and Management (repouching, etc.) 0 PROCESS - Coordination of Care X - Simple Patient / Family Education for ongoing care 1 15 []  - Complex (extensive) Patient / Family Education for ongoing care 0 X - Staff obtains Programmer, systems, Records, Test Results / Process Orders 1 10 []  - Staff telephones HHA, Nursing Homes / Clarify orders / etc 0 []  - Routine Transfer to another Facility (non-emergent condition) 0 [name], [name] ([id number]) []  - Routine Hospital Admission (non-emergent condition) 0 []  - New Admissions / Biomedical engineer / Ordering NPWT, Apligraf, etc. 0 []  - Emergency Hospital Admission (emergent condition) 0 X - Simple Discharge Coordination 1 10 []  - Complex (extensive) Discharge Coordination 0 PROCESS - Special Needs []  - Pediatric / Minor Patient Management 0 []  - Isolation Patient Management 0 []  - Hearing / Language / Visual special needs 0 []  - Assessment of Community assistance (transportation, D/C planning, etc.) 0 []  - Additional assistance /  Altered mentation 0 []  - Support Surface(s) Assessment (bed, cushion, seat, etc.) 0 INTERVENTIONS - Wound Cleansing / Measurement X - Simple Wound Cleansing - one wound 1 5 []  - Complex Wound Cleansing - multiple  wounds 0 X - Wound Imaging (photographs - any number of wounds) 1 5 []  - Wound Tracing (instead of photographs) 0 X - Simple Wound Measurement - one wound 1 5 []  - Complex Wound Measurement - multiple wounds 0 INTERVENTIONS - Wound Dressings X - Small Wound Dressing one or multiple wounds 1 10 []  - Medium Wound Dressing one or multiple wounds 0 []  - Large Wound Dressing one or multiple wounds 0 X - Application of Medications - topical 1 5 []  - Application of Medications - injection 0 INTERVENTIONS - Miscellaneous []  - External ear exam 0 [name], [name] ([id number]) []  - Specimen Collection (cultures, biopsies, blood, body fluids, etc.) 0 []  - Specimen(s) / Culture(s) sent or taken to Lab for analysis 0 []  - Patient Transfer (multiple staff / Harrel Lemon Lift / Similar devices) 0 []  - Simple Staple / Suture removal (25 or less) 0 []  - Complex Staple / Suture removal (26 or more) 0 []  - Hypo / Hyperglycemic Management (close monitor of Blood Glucose) 0 []  - Ankle / Brachial Index (ABI) - do not check if billed separately 0 X - Vital Signs 1 5 Has the patient been seen at the hospital within the last three years: Yes Total Score: 100 Level Of Care: New/Established - Level 3 Electronic Signature(s) Signed: 11/10/2015 4:54:11 PM By: Rebecca Eaton, RN, Sendra Entered By: Rebecca Eaton, RN, Sendra on 11/10/2015 10:48:15 [name] ([id number]) -------------------------------------------------------------------------------- Encounter Discharge Information Details Patient Name: [name] Date of Service: 11/10/2015 9:30 AM Medical Record Number: [id number] Patient Account Number: [id number] Date of Birth/Sex: 1946-08-10 (70 y.o. Male) Treating RN: Macarthur Critchley Primary Care  Physician: [name] Other Clinician: Referring Physician: [name] Treating Physician/Extender: Frann Rider in Treatment: 6 Encounter Discharge Information Items Discharge Pain Level: 0 Discharge Condition: Stable Ambulatory Status: Ambulatory Emergency Discharge Destination: Room Transportation: Private Auto Accompanied By: self Schedule Follow-up Appointment: Yes Medication Reconciliation completed Yes and provided to Patient/Care Berklee Battey: Clinical Summary of Care: Electronic Signature(s) Signed: 11/10/2015 4:54:11 PM By: Rebecca Eaton, RN, Sendra Entered By: Rebecca Eaton, RN, Sendra on 11/10/2015 10:09:01 [name] ([id number]) -------------------------------------------------------------------------------- Lower Extremity Assessment Details Patient Name: [name] Date of Service: 11/10/2015 9:30 AM Medical Record Number: [id number] Patient Account Number: [id number] Date of Birth/Sex: July 08, 1946 (70 y.o. Male) Treating RN: Macarthur Critchley Primary Care Physician: [name] Other Clinician: Referring Physician: [name] Treating Physician/Extender: Frann Rider in Treatment: 6 Edema Assessment Assessed: [Left: No] [Right: No] E[Left: dema] [Right: :] Calf Left: Right: Point of Measurement: cm From Medial Instep 35 cm cm Ankle Left: Right: Point of Measurement: cm From Medial Instep 20.5 cm cm Vascular Assessment Pulses: Posterior Tibial Dorsalis Pedis Palpable: [Left:Yes] Extremity colors, hair growth, and conditions: Extremity Color: [Left:Hyperpigmented] Temperature of Extremity: [Left:Warm] Capillary Refill: [Left:< 3 seconds] Toe Nail Assessment Left: Right: Thick: No Discolored: No Deformed: No Improper Length and Hygiene: No Electronic Signature(s) Signed: 11/10/2015 4:54:11 PM By: Rebecca Eaton, RN, Sendra Entered By: Rebecca Eaton RN, Sendra on 11/10/2015 09:48:49 [name], [name]  ([id number]) -------------------------------------------------------------------------------- Pain Assessment Details Patient Name: [name] Date of Service: 11/10/2015 9:30 AM Medical Record Number: [id number] Patient Account Number: [id number] Date of Birth/Sex: 1946-04-25 (70 y.o. Male) Treating RN: Macarthur Critchley Primary Care Physician: [name] Other Clinician: Referring Physician: [name] Treating Physician/Extender: Frann Rider in Treatment: 6 Active Problems Location of Pain Severity and Description of Pain Patient Has Paino No Site Locations Rate the pain. Current Pain Level: 0 Pain Management and Medication Current Pain Management: Electronic Signature(s) Signed: 11/10/2015 4:54:11  PM By: Rebecca Eaton, RN, Romero Liner By: Rebecca Eaton RN, Sendra on 11/10/2015 09:39:15 [name] ([id number]) -------------------------------------------------------------------------------- Patient/Caregiver Education Details Patient Name: [name] Date of Service: 11/10/2015 9:30 AM Medical Record Number: [id number] Patient Account Number: [id number] Date of Birth/Gender: 05-19-1946 (69 y.o. Male) Treating RN: Macarthur Critchley Primary Care Physician: [name] Other Clinician: Referring Physician: [name] Treating Physician/Extender: Frann Rider in Treatment: 6 Education Assessment Education Provided To: Patient Education Topics Provided Venous: Handouts: Controlling Swelling with Compression Stockings Methods: Explain/Verbal Responses: State content correctly Electronic Signature(s) Signed: 11/10/2015 4:54:11 PM By: Rebecca Eaton, RN, Sendra Entered By: Rebecca Eaton RN, Sendra on 11/10/2015 10:09:16 [name] ([id number]) -------------------------------------------------------------------------------- Wound Assessment Details Patient Name: [name] Date of Service: 11/10/2015 9:30 AM Medical Record  Number: [id number] Patient Account Number: [id number] Date of Birth/Sex: 16-Jan-1946 (70 y.o. Male) Treating RN: Macarthur Critchley Primary Care Physician: [name] Other Clinician: Referring Physician: [name] Treating Physician/Extender: Frann Rider in Treatment: 6 Wound Status Wound Number: 1 Primary Trauma, Other Etiology: Wound Location: Left Lower Leg - Midline, Anterior Wound Open Status: Wounding Event: Trauma Comorbid Cataracts, Glaucoma, Anemia, Date Acquired: 07/30/2015 History: Arrhythmia, Type II Diabetes, Weeks Of Treatment: 6 Osteoarthritis, Neuropathy Clustered Wound: No Photos Photo Uploaded By: Rebecca Eaton, RN, Roslynn Amble on 11/10/2015 16:52:01 Wound Measurements Length: (cm) 0.9 Width: (cm) 0.5 Depth: (cm) 0.2 Area: (cm) 0.353 Volume: (cm) 0.071 % Reduction in Area: 88.8% % Reduction in Volume: 92.5% Epithelialization: Medium (34-66%) Tunneling: No Undermining: No Wound Description Full Thickness Without Exposed Foul Odor A Classification: Support Structures Diabetic Severity Grade 2 (Wagner): Wound Margin: Flat and Intact Exudate Amount: Medium Exudate Type: Serosanguineous Exudate Color: red, brown fter Cleansing: No Wound Bed Granulation Amount: Large (67-100%) Exposed Structure [name], [name] ([id number]) Granulation Quality: Red, Pink Fascia Exposed: No Necrotic Amount: Small (1-33%) Fat Layer Exposed: No Necrotic Quality: Adherent Slough Tendon Exposed: No Muscle Exposed: No Joint Exposed: No Bone Exposed: No Limited to Skin Breakdown Periwound Skin Texture Texture Color No Abnormalities Noted: No No Abnormalities Noted: No Localized Edema: Yes Erythema: No Moisture Temperature / Pain No Abnormalities Noted: No Temperature: No Abnormality Moist: Yes Wound Preparation Ulcer Cleansing: Other: soap and water, Topical Anesthetic Applied: Other: lidocaine 4%, Treatment Notes Wound #1 (Left, Midline,  Anterior Lower Leg) 1. Cleansed with: Clean wound with Normal Saline Cleanse wound with antibacterial soap and water 2. Anesthetic Topical Lidocaine 4% cream to wound bed prior to debridement 4. Dressing Applied: Prisma Ag 5. Secondary Dressing Applied Dry Gauze 7. Secured with 2 Layer Compression System - Left Lower Extremity Electronic Signature(s) Signed: 11/10/2015 4:54:11 PM By: Rebecca Eaton, RN, Sendra Entered By: Rebecca Eaton RN, Sendra on 11/10/2015 09:49:16 [name], [name] ([id number]) -------------------------------------------------------------------------------- Vitals Details Patient Name: [name] Date of Service: 11/10/2015 9:30 AM Medical Record Number: [id number] Patient Account Number: [id number] Date of Birth/Sex: [birth year] (70 y.o. Male) Treating RN: Macarthur Critchley Primary Care Physician: [name] Other Clinician: Referring Physician: [name] Treating Physician/Extender: Frann Rider in Treatment: 6 Vital Signs Time Taken: 09:40 Temperature (F): 97.9 Height (in): 68 Pulse (bpm): 78 Weight (lbs): 178 Respiratory Rate (breaths/min): 20 Body Mass Index (BMI): 27.1 Blood Pressure (mmHg): 158/91 Reference Range: 80 - 120 mg / dl Electronic Signature(s) Signed: 11/10/2015 4:54:11 PM By: Rebecca Eaton, RN, Sendra Entered By: Rebecca Eaton RN, Sendra on 11/10/2015 09:44:13

## 2015-11-17 ENCOUNTER — Encounter: Payer: Medicare Other | Admitting: Surgery

## 2015-11-17 DIAGNOSIS — I87312 Chronic venous hypertension (idiopathic) with ulcer of left lower extremity: Secondary | ICD-10-CM | POA: Diagnosis not present

## 2015-11-17 DIAGNOSIS — I739 Peripheral vascular disease, unspecified: Secondary | ICD-10-CM | POA: Diagnosis not present

## 2015-11-17 DIAGNOSIS — E1122 Type 2 diabetes mellitus with diabetic chronic kidney disease: Secondary | ICD-10-CM | POA: Diagnosis not present

## 2015-11-17 DIAGNOSIS — Z940 Kidney transplant status: Secondary | ICD-10-CM | POA: Diagnosis not present

## 2015-11-17 DIAGNOSIS — L97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er exposed: Secondary | ICD-10-CM | POA: Diagnosis not present

## 2015-11-17 DIAGNOSIS — E11622 Type 2 diabetes mellitus with other skin ulcer: Secondary | ICD-10-CM | POA: Diagnosis not present

## 2015-11-17 DIAGNOSIS — S41112A Laceration without foreign body of left upper arm, initial encounter: Secondary | ICD-10-CM | POA: Diagnosis not present

## 2015-11-17 DIAGNOSIS — S81802A Unspecified open wound, left lower leg, initial encounter: Secondary | ICD-10-CM | POA: Diagnosis not present

## 2015-11-17 NOTE — Progress Notes (Signed)
DEKLYN, APPLEWHITE ([id number]) Visit Report for 11/17/2015 Arrival Information Details Patient Name: [name], [name] Date of Service: 11/17/2015 9:30 AM Medical Record Number: [id number] Patient Account Number: [id number] Date of Birth/Sex: 1945-11-13 (70 y.o. Male) Treating RN: Macarthur Critchley Primary Care Physician: [name] Other Clinician: Referring Physician: [name] Treating Physician/Extender: Frann Rider in Treatment: 7 Visit Information History Since Last Visit All ordered tests and consults were completed: No Patient Arrived: Ambulatory Added or deleted any medications: No Arrival Time: 09:36 Any new allergies or adverse reactions: No Accompanied By: self Had a fall or experienced change in No Transfer Assistance: None activities of daily living that may affect Patient Identification Verified: Yes risk of falls: Secondary Verification Process Yes Signs or symptoms of abuse/neglect since last No Completed: visito Patient Requires Transmission- No Hospitalized since last visit: No Based Precautions: Has Dressing in Place as Prescribed: Yes Patient Has Alerts: Yes Has Compression in Place as Prescribed: Yes Patient Alerts: DM II Pain Present Now: No ASA non-compressible 09/29/15 Electronic Signature(s) Signed: 11/17/2015 4:13:06 PM By: Rebecca Eaton, RN, Sendra Entered By: Rebecca Eaton RN, Sendra on 11/17/2015 09:37:00 [name] ([id number]) -------------------------------------------------------------------------------- Encounter Discharge Information Details Patient Name: [name] Date of Service: 11/17/2015 9:30 AM Medical Record Number: [id number] Patient Account Number: [id number] Date of Birth/Sex: 11-27-1945 (70 y.o. Male) Treating RN: Macarthur Critchley Primary Care Physician: [name] Other Clinician: Referring Physician: [name] Treating Physician/Extender: Frann Rider in Treatment:  7 Encounter Discharge Information Items Discharge Pain Level: 0 Discharge Condition: Stable Ambulatory Status: Ambulatory Discharge Destination: Home Transportation: Private Auto Accompanied By: self Schedule Follow-up Appointment: Yes Medication Reconciliation completed and provided to Patient/Care Yes Lanee Chain: Provided on Clinical Summary of Care: 11/17/2015 Form Type Recipient Paper Patient AR Electronic Signature(s) Signed: 11/17/2015 4:13:06 PM By: Rebecca Eaton RN, Sendra Previous Signature: 11/17/2015 10:12:53 AM Version By: Ruthine Dose Entered By: Rebecca Eaton RN, Sendra on 11/17/2015 10:13:57 [name] ([id number]) -------------------------------------------------------------------------------- Lower Extremity Assessment Details Patient Name: [name] Date of Service: 11/17/2015 9:30 AM Medical Record Number: [id number] Patient Account Number: [id number] Date of Birth/Sex: Jan 01, 1946 (70 y.o. Male) Treating RN: Macarthur Critchley Primary Care Physician: [name] Other Clinician: Referring Physician: [name] Treating Physician/Extender: Frann Rider in Treatment: 7 Edema Assessment Assessed: [Left: No] [Right: No] E[Left: dema] [Right: :] Calf Left: Right: Point of Measurement: 32 cm From Medial Instep 37 cm cm Ankle Left: Right: Point of Measurement: 10 cm From Medial Instep 22 cm cm Vascular Assessment Pulses: Posterior Tibial Dorsalis Pedis Palpable: [Left:Yes] Extremity colors, hair growth, and conditions: Extremity Color: [Left:Mottled] Hair Growth on Extremity: [Left:Yes] Temperature of Extremity: [Left:Warm] Capillary Refill: [Left:< 3 seconds] Toe Nail Assessment Left: Right: Thick: No Discolored: No Deformed: No Improper Length and Hygiene: No Electronic Signature(s) Signed: 11/17/2015 4:13:06 PM By: Rebecca Eaton, RN, Sendra Entered By: Rebecca Eaton RN, Sendra on 11/17/2015 09:45:07 [name]  ([id number]) -------------------------------------------------------------------------------- Pain Assessment Details Patient Name: [name] Date of Service: 11/17/2015 9:30 AM Medical Record Number: [id number] Patient Account Number: [id number] Date of Birth/Sex: 1946-04-03 (70 y.o. Male) Treating RN: Macarthur Critchley Primary Care Physician: [name] Other Clinician: Referring Physician: [name] Treating Physician/Extender: Frann Rider in Treatment: 7 Active Problems Location of Pain Severity and Description of Pain Patient Has Paino No Site Locations Rate the pain. Current Pain Level: 0 Pain Management and Medication Current Pain Management: Electronic Signature(s) Signed: 11/17/2015 4:13:06 PM By: Rebecca Eaton, RN, Sendra Entered By: Rebecca Eaton RN, Sendra on 11/17/2015 09:37:10 [name] ([id number]) -------------------------------------------------------------------------------- Patient/Caregiver Education Details Patient Name: [name]  Date of Service: 11/17/2015 9:30 AM Medical Record Number: [id number] Patient Account Number: [id number] Date of Birth/Gender: 05-08-1946 (70 y.o. Male) Treating RN: Macarthur Critchley Primary Care Physician: [name] Other Clinician: Referring Physician: [name] Treating Physician/Extender: Frann Rider in Treatment: 7 Education Assessment Education Provided To: Patient Education Topics Provided Venous: Handouts: Other: 20-30 thigh high compression Electronic Signature(s) Signed: 11/17/2015 4:13:06 PM By: Rebecca Eaton, RN, Sendra Entered By: Rebecca Eaton RN, Sendra on 11/17/2015 10:13:35 [name] ([id number]) -------------------------------------------------------------------------------- Wound Assessment Details Patient Name: [name] Date of Service: 11/17/2015 9:30 AM Medical Record Number: [id number] Patient Account Number: [id number] Date of Birth/Sex:  1946/07/15 (70 y.o. Male) Treating RN: Macarthur Critchley Primary Care Physician: [name] Other Clinician: Referring Physician: [name] Treating Physician/Extender: Frann Rider in Treatment: 7 Wound Status Wound Number: 1 Primary Etiology: Trauma, Other Wound Location: Left, Midline, Anterior Lower Wound Status: Open Leg Wounding Event: Trauma Date Acquired: 07/30/2015 Weeks Of Treatment: 7 Clustered Wound: No Photos Photo Uploaded By: Rebecca Eaton RN, Roslynn Amble on 11/17/2015 15:52:25 Wound Measurements Length: (cm) 0.7 Width: (cm) 0.2 Depth: (cm) 0.1 Area: (cm) 0.11 Volume: (cm) 0.011 % Reduction in Area: 96.5% % Reduction in Volume: 98.8% Wound Description Full Thickness Without Exposed Classification: Support Structures Periwound Skin Texture Texture Color No Abnormalities Noted: No No Abnormalities Noted: No Moisture No Abnormalities Noted: No Treatment Notes Scifres, Deloyd ([id number]) Wound #1 (Left, Midline, Anterior Lower Leg) 1. Cleansed with: Clean wound with Normal Saline Cleanse wound with antibacterial soap and water 2. Anesthetic Topical Lidocaine 4% cream to wound bed prior to debridement 4. Dressing Applied: Prisma Ag 5. Secondary Dressing Applied Bordered Foam Dressing 7. Secured with Patient to wear own compression stockings Support Garment 20-30 mm/Hg pressure to: Electronic Signature(s) Signed: 11/17/2015 4:13:06 PM By: Rebecca Eaton, RN, Sendra Entered By: Rebecca Eaton, RN, Sendra on 11/17/2015 10:00:42 [name] ([id number]) -------------------------------------------------------------------------------- Wound Assessment Details Patient Name: [name] Date of Service: 11/17/2015 9:30 AM Medical Record Number: [id number] Patient Account Number: [id number] Date of Birth/Sex: 12-24-1945 (70 y.o. Male) Treating RN: Macarthur Critchley Primary Care Physician: [name] Other Clinician: Referring  Physician: [name] Treating Physician/Extender: Frann Rider in Treatment: 7 Wound Status Wound Number: 2 Primary Skin Tear Etiology: Wound Location: Left Forearm Wound Open Wounding Event: Trauma Status: Date Acquired: 11/13/2015 Comorbid Cataracts, Glaucoma, Anemia, Weeks Of Treatment: 0 History: Arrhythmia, Type II Diabetes, Clustered Wound: No Osteoarthritis, Neuropathy Photos Photo Uploaded By: Rebecca Eaton RN, Roslynn Amble on 11/17/2015 15:52:34 Wound Measurements Length: (cm) 3.4 Width: (cm) 0.9 Depth: (cm) 0.1 Area: (cm) 2.403 Volume: (cm) 0.24 % Reduction in Area: 0% % Reduction in Volume: 0% Epithelialization: None Tunneling: No Undermining: No Wound Description Classification: Partial Thickness Exudate Amount: Small Exudate Type: Serosanguineous Exudate Color: red, brown Wound Bed Granulation Amount: Large (67-100%) Exposed Structure Granulation Quality: Red Fascia Exposed: No Necrotic Amount: None Present (0%) Fat Layer Exposed: No Tendon Exposed: No Muscle Exposed: No [place], Tammi Klippel ([id number]) Joint Exposed: No Bone Exposed: No Limited to Skin Breakdown Periwound Skin Texture Texture Color No Abnormalities Noted: No No Abnormalities Noted: No Callus: No Atrophie Blanche: No Crepitus: No Cyanosis: No Excoriation: No Ecchymosis: No Fluctuance: No Erythema: No Friable: No Hemosiderin Staining: No Induration: No Mottled: No Localized Edema: No Pallor: No Rash: No Rubor: No Scarring: No Moisture No Abnormalities Noted: No Dry / Scaly: No Maceration: No Moist: No Wound Preparation Ulcer Cleansing: Rinsed/Irrigated with Saline Topical Anesthetic Applied: None Treatment Notes Wound #2 (Left Forearm) 1. Cleansed with: Clean wound with Normal Saline 4. Dressing  Applied: Other dressing (specify in notes) Notes siltet sorbact, keep dry Electronic Signature(s) Signed: 11/17/2015 4:13:06 PM By: Rebecca Eaton, RN,  Sendra Entered By: Rebecca Eaton RN, Sendra on 11/17/2015 09:48:37 NEYMAR, THEESFELD ([id number]) -------------------------------------------------------------------------------- Vitals Details Patient Name: [name] Date of Service: 11/17/2015 9:30 AM Medical Record Number: [id number] Patient Account Number: [id number] Date of Birth/Sex: [birth year] (70 y.o. Male) Treating RN: Macarthur Critchley Primary Care Physician: [name] Other Clinician: Referring Physician: [name] Treating Physician/Extender: Frann Rider in Treatment: 7 Vital Signs Time Taken: 09:40 Temperature (F): 98.1 Height (in): 68 Pulse (bpm): 78 Weight (lbs): 178 Blood Pressure (mmHg): 168/60 Body Mass Index (BMI): 27.1 Reference Range: 80 - 120 mg / dl Electronic Signature(s) Signed: 11/17/2015 4:13:06 PM By: Rebecca Eaton RN, Sendra Entered By: Rebecca Eaton RN, Sendra on 11/17/2015 09:51:43

## 2015-11-18 NOTE — Progress Notes (Addendum)
KINGSLY, SCURRY ([id number]) Visit Report for 11/17/2015 Chief Complaint Document Details Patient Name: [name], [name] Date of Service: 11/17/2015 9:30 AM Medical Record Number: [id number] Patient Account Number: [id number] Date of Birth/Sex: 07/07/1946 (70 y.o. Male) Treating RN: Macarthur Critchley Primary Care Physician: [name] Other Clinician: Referring Physician: [name] Treating Physician/Extender: Frann Rider in Treatment: 7 Information Obtained from: Patient Chief Complaint Patient presents to the wound care center for a consult due non healing wound to the left lower extremity for the last 2 months. Electronic Signature(s) Signed: 11/17/2015 10:13:22 AM By: Christin Fudge MD, FACS Entered By: Christin Fudge on 11/17/2015 10:13:22 [name], [name] ([id number]) -------------------------------------------------------------------------------- Debridement Details Patient Name: [name] Date of Service: 11/17/2015 9:30 AM Medical Record Number: [id number] Patient Account Number: [id number] Date of Birth/Sex: 09/10/1946 (70 y.o. Male) Treating RN: Macarthur Critchley Primary Care Physician: [name] Other Clinician: Referring Physician: [name] Treating Physician/Extender: Frann Rider in Treatment: 7 Debridement Performed for Wound #1 Left,Midline,Anterior Lower Leg Assessment: Performed By: Physician Christin Fudge, MD Debridement: Debridement Pre-procedure Yes Verification/Time Out Taken: Start Time: 10:02 Pain Control: Lidocaine 5% topical ointment Level: Skin/Subcutaneous Tissue Total Area Debrided (L x 0.7 (cm) x 0.2 (cm) = 0.14 (cm) W): Tissue and other Viable, Non-Viable, Exudate, Fibrin/Slough, Subcutaneous material debrided: Instrument: Forceps Bleeding: Minimum Hemostasis Achieved: Pressure End Time: 10:04 Procedural Pain: 0 Post Procedural Pain: 0 Response to Treatment: Procedure was tolerated  well Post Debridement Measurements of Total Wound Length: (cm) 0.7 Width: (cm) 0.2 Depth: (cm) 0.1 Volume: (cm) 0.011 Post Procedure Diagnosis Same as Pre-procedure Electronic Signature(s) Signed: 11/17/2015 10:13:15 AM By: Christin Fudge MD, FACS Entered By: Christin Fudge on 11/17/2015 10:13:15 [name] ([id number]) -------------------------------------------------------------------------------- HPI Details Patient Name: [name] Date of Service: 11/17/2015 9:30 AM Medical Record Number: [id number] Patient Account Number: [id number] Date of Birth/Sex: 08-30-1946 (70 y.o. Male) Treating RN: Macarthur Critchley Primary Care Physician: [name] Other Clinician: Referring Physician: [name] Treating Physician/Extender: Frann Rider in Treatment: 7 History of Present Illness Location: left lower leg anteriorly on his shin Quality: Patient reports experiencing a dull pain to affected area(s). Severity: Patient states wound are getting worse. Duration: Patient has had the wound for > 2 months prior to seeking treatment at the wound center Timing: Pain in wound is Intermittent (comes and goes Context: The wound occurred when the patient had a blunt injury with a golf cart 2 months ago Modifying Factors: Other treatment(s) tried include: he has been applying local ointments to this and once the scab came out he noticed a deep ulceration. Associated Signs and Symptoms: Patient reports having difficulty standing for long periods. HPI Description: 70 year old gentleman who was recently seen by his nephrologist [name], and noted to have a wound on his left lower extremity which was lacerated 2 months ago and now has reopened. The patient's left shin has a ulceration with some exudate but no evidence of infection and he was referred to [place] for further care as it was known that the patient has had some peripheral vascular disease in the  past. Past medical history significant for chronic kidney disease, atrial fibrillation, diabetes mellitus,status post kidney transplant in 1983 and 2005, a week fistula graft placement, status post previous bowel surgery. 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 10/06/2015 -- x-ray of the left tibia and fibula shows no evidence of osteomyelitis. The patient has also had Doppler studies of his extremity and is awaiting  the appointment with the vascular surgeon. We have not yet received these reports. 10/13/2015 -- lower extremity venous duplex reflux evaluation shows reflux in the left common femoral vein, left saphenofemoral junction and the proximal greater saphenous vein extending to the proximal calf. There is also reflux in the left proximal to mid small saphenous vein. Arterial duplex studies done showed the resting ABI was not applicable due to tibial artery medial calcification. 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 The right TBI was 1 which is normal and the left ABI was 1 which is normal. Patient has otherwise been doing fine and has been compliant with his dressings. 10/20/2015 -- He was seen by [name] recently for a vascular opinion on 10/15/2015. His left lower extremity venous insufficiency duplex study revealed GSV reflux,SS vein reflux and deep venous reflux in the common femoral vein. His ABIs were non compressible and his TBI on the right was 1.01 and on the left was 0.80. He was asked to continue with the wound care with compressive therapy followed by EVLA of the left GS vein 3 months. He recommended 20-30 mm thigh-high compression stockings and the need for a three- month trial of this. The patient had an Unna boot applied at the vascular office but he could not tolerate this with a lot of pain and issues  with his toes and hence came here on Friday for removal of this and we reapplied a 2 layer Buzby, [name] ([id number]) compression. 11/10/2015 -- patient still has not purchased his 20-30 mm thigh-high compression stockings as prescribed by [name]. 11/17/2015 -- 3 days ago he injured his left forearm on a bedpost and has had a lacerated wound which she has been treating with Neosporin. Electronic Signature(s) Signed: 11/17/2015 10:13:58 AM By: Christin Fudge MD, FACS Entered By: Christin Fudge on 11/17/2015 10:13:58 [name], [name] ([id number]) -------------------------------------------------------------------------------- Physical Exam Details Patient Name: [name] Date of Service: 11/17/2015 9:30 AM Medical Record Number: [id number] Patient Account Number: [id number] Date of Birth/Sex: 09/27/1945 (70 y.o. Male) Treating RN: Macarthur Critchley Primary Care Physician: [name] Other Clinician: Referring Physician: [name] Treating Physician/Extender: Frann Rider in Treatment: 7 Constitutional . Pulse regular. Respirations normal and unlabored. Afebrile. . Eyes Nonicteric. Reactive to light. Ears, Nose, Mouth, and Throat Lips, teeth, and gums WNL.Marland Kitchen Moist mucosa without lesions. Neck supple and nontender. No palpable supraclavicular or cervical adenopathy. Normal sized without goiter. Respiratory WNL. No retractions.. Cardiovascular Pedal Pulses WNL. No clubbing, cyanosis or edema. Lymphatic No adneopathy. No adenopathy. No adenopathy. Musculoskeletal Adexa without tenderness or enlargement.. Digits and nails w/o clubbing, cyanosis, infection, petechiae, ischemia, or inflammatory conditions.. Integumentary (Hair, Skin) No suspicious lesions. No crepitus or fluctuance. No peri-wound warmth or erythema. No masses.Marland Kitchen Psychiatric Judgement and insight Intact.. No evidence of depression, anxiety, or agitation.. Notes the wound on the left lower  extremity has some eschar and subcutaneous slough and this was nicely cleaned out with a toothed forcep and all the debris and subcutaneous area was removed sharply. Hemostasis was achieved with pressure. On his left forearm he has a lacerated wound with a skin flap which was brought as close to the raw surface as possible. Subcutaneous tissue was clean. No debridement was required here. Electronic Signature(s) Signed: 11/17/2015 10:15:12 AM By: Christin Fudge MD, FACS Entered By: Christin Fudge on 11/17/2015 10:15:12 [name] ([id number]) -------------------------------------------------------------------------------- Physician Orders Details Patient Name:  [name] Date of Service: 11/17/2015 9:30 AM Medical Record Number: [id number] Patient Account Number: [id number] Date of Birth/Sex: Oct 31, 1945 (70 y.o. Male) Treating RN: Macarthur Critchley Primary Care Physician: [name] Other Clinician: Referring Physician: [name] Treating Physician/Extender: Frann Rider in Treatment: 7 Verbal / Phone Orders: Yes Clinician: Macarthur Critchley Read Back and Verified: Yes Diagnosis Coding Wound Cleansing Wound #1 Left,Midline,Anterior Lower Leg o May shower with protection. Wound #2 Left Forearm o May shower with protection. Primary Wound Dressing Wound #1 Left,Midline,Anterior Lower Leg o Prisma Ag Wound #2 Left Forearm o Other: - siltec sorbact Secondary Dressing Wound #1 Left,Midline,Anterior Lower Leg o Boardered Foam Dressing Dressing Change Frequency Wound #1 Left,Midline,Anterior Lower Leg o Change dressing every other day. Wound #2 Left Forearm o Change dressing every week - dont change dressing unless it gets wet Follow-up Appointments Wound #1 Left,Midline,Anterior Lower Leg o Return Appointment in 1 week. Wound #2 Left Forearm o Return Appointment in 1 week. Edema Control Wound #1 Left,Midline,Anterior Lower  Leg o Patient to wear own compression stockings - 20-30 thigh high compression o Elevate legs to the level of the heart and pump ankles as often as possible [name], [name] ([id number]) Electronic Signature(s) Unsigned Entered By: Rebecca Eaton RN, Sendra on 11/17/2015 10:12:06 Signature(s): Date(s): [name] ([id number]) -------------------------------------------------------------------------------- Problem List Details Patient Name: [name], [name] Date of Service: 11/17/2015 9:30 AM Medical Record Number: [id number] Patient Account Number: [id number] Date of Birth/Sex: 12-15-1945 (70 y.o. 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/29/2015 Yes exposed I73.9 Peripheral vascular disease, unspecified 09/29/2015 Yes I87.312 Chronic venous hypertension (idiopathic) with ulcer of left 09/29/2015 Yes lower extremity Z94.0 Kidney transplant status 09/29/2015 Yes S41.112A Laceration without foreign body of left upper arm, initial 11/17/2015 Yes encounter Inactive Problems Resolved Problems Electronic Signature(s) Signed: 11/17/2015 10:10:59 AM By: Christin Fudge MD, FACS Entered By: Christin Fudge on 11/17/2015 10:10:58 [name] ([id number]) -------------------------------------------------------------------------------- Progress Note Details Patient Name: [name] Date of Service: 11/17/2015 9:30 AM Medical Record Number: [id number] Patient Account Number: [id number] Date of Birth/Sex: [birth year] (70 y.o. Male) Treating RN: Macarthur Critchley Primary Care Physician: [name] Other  Clinician: Referring Physician: [name] Treating Physician/Extender: Frann Rider in Treatment: 7 Subjective Chief Complaint Information obtained from Patient Patient presents to the wound care center for a consult due non healing wound to the left lower extremity for the last 2 months. History of Present Illness (HPI) The following HPI elements were documented for the patient's wound: Location: left lower leg anteriorly on his shin Quality: Patient reports experiencing a dull pain to affected area(s). Severity: Patient states wound are getting worse. Duration: Patient has had the wound for > 2 months prior to seeking treatment at the wound center Timing: Pain in wound is Intermittent (comes and goes Context: The wound occurred when the patient had a blunt injury with a golf cart 2 months ago Modifying Factors: Other treatment(s) tried include: he has been applying local ointments to this and once the scab came out he noticed a deep ulceration. Associated Signs and Symptoms: Patient reports having difficulty standing for long periods. 70 year old gentleman who was recently seen by his nephrologist [name], and noted to have a wound on his left lower extremity  which was lacerated 2 months ago and now has reopened. The patient's left shin has a ulceration with some exudate but no evidence of infection and he was referred to [place] for further care as it was known that the patient has had some peripheral vascular disease in the past. Past medical history significant for chronic kidney disease, atrial fibrillation, diabetes mellitus,status post kidney transplant in 1983 and 2005, a week fistula graft placement, status post previous bowel surgery. 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 10/06/2015 -- x-ray of the left tibia and fibula shows no evidence of osteomyelitis. The patient has also had Doppler studies of his extremity  and is awaiting the appointment with the vascular surgeon. We have not yet received these reports. 10/13/2015 -- lower extremity venous duplex reflux evaluation shows reflux in the left common femoral vein, left saphenofemoral junction and the proximal greater saphenous vein extending to the proximal calf. There is also reflux in the left proximal to mid small saphenous vein. Arterial duplex studies done showed the resting ABI was not applicable due to tibial artery medial calcification. 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 The right TBI was 1 which is normal and the left ABI was 1 which is normal. Patient has otherwise been doing fine and has been compliant with his dressings. 10/20/2015 -- He was seen by [name] recently for a vascular opinion on 10/15/2015. His left lower extremity venous insufficiency duplex study revealed GSV reflux,SS vein reflux and deep venous reflux in [name], [name] ([id number]) the common femoral vein. His ABIs were non compressible and his TBI on the right was 1.01 and on the left was 0.80. He was asked to continue with the wound care with compressive therapy followed by EVLA of the left GS vein 3 months. He recommended 20-30 mm thigh-high compression stockings and the need for a three- month trial of this. The patient had an Unna boot applied at the vascular office but he could not tolerate this with a lot of pain and issues with his toes and hence came here on Friday for removal of this and we reapplied a 2 layer compression. 11/10/2015 -- patient still has not purchased his 20-30 mm thigh-high compression stockings as prescribed by [name]. 11/17/2015 -- 3 days ago he injured his left forearm on a bedpost and has had a lacerated wound which she has been treating with Neosporin. Objective Constitutional Pulse regular.  Respirations normal and unlabored. Afebrile. Vitals Time Taken: 9:40 AM, Height: 68 in, Weight: 178 lbs, BMI: 27.1, Temperature: 98.1 F, Pulse: 78 bpm, Blood Pressure: 168/60 mmHg. Eyes Nonicteric. Reactive to light. Ears, Nose, Mouth, and Throat Lips, teeth, and gums WNL.Marland Kitchen Moist mucosa without lesions. Neck supple and nontender. No palpable supraclavicular or cervical adenopathy. Normal sized without goiter. Respiratory WNL. No retractions.. Cardiovascular Pedal Pulses WNL. No clubbing, cyanosis or edema. Lymphatic No adneopathy. No adenopathy. No adenopathy. Musculoskeletal Adexa without tenderness or enlargement.. Digits and nails w/o clubbing, cyanosis, infection, petechiae, ischemia, or inflammatory conditions.[name], [name] ([id number]) Psychiatric Judgement and insight Intact.. No evidence of depression, anxiety, or agitation.. General Notes: the wound on the left lower extremity has some eschar and subcutaneous slough and this was nicely cleaned out with a toothed forcep and all the debris and subcutaneous area was removed sharply. Hemostasis was achieved with pressure. On  his left forearm he has a lacerated wound with a skin flap which was brought as close to the raw surface as possible. Subcutaneous tissue was clean. No debridement was required here. Integumentary (Hair, Skin) No suspicious lesions. No crepitus or fluctuance. No peri-wound warmth or erythema. No masses.. Wound #1 status is Open. Original cause of wound was Trauma. The wound is located on the Left,Midline,Anterior Lower Leg. The wound measures 0.7cm length x 0.2cm width x 0.1cm depth; 0.11cm^2 area and 0.011cm^3 volume. Wound #2 status is Open. Original cause of wound was Trauma. The wound is located on the Left Forearm. The wound measures 3.4cm length x 0.9cm width x 0.1cm depth; 2.403cm^2 area and 0.24cm^3 volume. The wound is limited to skin breakdown. There is no tunneling or undermining noted.  There is a small amount of serosanguineous drainage noted. There is large (67-100%) red granulation within the wound bed. There is no necrotic tissue within the wound bed. The periwound skin appearance did not exhibit: Callus, Crepitus, Excoriation, Fluctuance, Friable, Induration, Localized Edema, Rash, Scarring, Dry/Scaly, Maceration, Moist, Atrophie Blanche, Cyanosis, Ecchymosis, Hemosiderin Staining, Mottled, Pallor, Rubor, Erythema. Assessment Active Problems ICD-10 E11.622 - Type 2 diabetes mellitus with other skin ulcer L97.222 - Non-pressure chronic ulcer of left calf with fat layer exposed I73.9 - Peripheral vascular disease, unspecified I87.312 - Chronic venous hypertension (idiopathic) with ulcer of left lower extremity Z94.0 - Kidney transplant status S41.112A - Laceration without foreign body of left upper arm, initial encounter For his left lower extremity I have recommended Prisma AG to be applied every other day and he will use his compression stockings of the 20-30 mm variety. [name], [name] ([id number]) For his left forearm I have recommended Siltec Sorbact to be applied and a Kerlix and Coban dressing to be placed over this. I have asked him not to change this dressing unless weight. He'll come back and see as next week. Procedures Wound #1 Wound #1 is a Trauma, Other located on the Left,Midline,Anterior Lower Leg . There was a Skin/Subcutaneous Tissue Debridement [id number]) debridement with total area of 0.14 sq cm performed by Christin Fudge, MD. with the following instrument(s): Forceps to remove Viable and Non-Viable tissue/material including Exudate, Fibrin/Slough, and Subcutaneous after achieving pain control using Lidocaine 5% topical ointment. A time out was conducted prior to the start of the procedure. A Minimum amount of bleeding was controlled with Pressure. The procedure was tolerated well with a pain level of 0 throughout and a pain level of 0  following the procedure. Post Debridement Measurements: 0.7cm length x 0.2cm width x 0.1cm depth; 0.011cm^3 volume. Post procedure Diagnosis Wound #1: Same as Pre-Procedure Plan Wound Cleansing: Wound #1 Left,Midline,Anterior Lower Leg: May shower with protection. Wound #2 Left Forearm: May shower with protection. Primary Wound Dressing: Wound #1 Left,Midline,Anterior Lower Leg: Prisma Ag Wound #2 Left Forearm: Other: - siltec sorbact Secondary Dressing: Wound #1 Left,Midline,Anterior Lower Leg: Boardered Foam Dressing Dressing Change Frequency: Wound #1 Left,Midline,Anterior Lower Leg: Change dressing every other day. Wound #2 Left Forearm: Change dressing every week - dont change dressing unless it gets wet Follow-up Appointments: Wound #1 Left,Midline,Anterior Lower Leg: Return Appointment in 1 week. Wound #2 Left Forearm: Return Appointment in 1 week. Edema Control: Wound #1 Left,Midline,Anterior Lower Leg: [name], [name] ([id number]) Patient to wear own compression stockings - 20-30 thigh high compression Elevate legs to the level of the heart and pump ankles as often as possible For his left lower extremity I have recommended Prisma AG to be applied every  other day and he will use his compression stockings of the 20-30 mm variety. For his left forearm I have recommended Siltec Sorbact to be applied and a Kerlix and Coban dressing to be placed over this. I have asked him not to change this dressing unless weight. He'll come back and see as next week. Electronic Signature(s) Signed: 11/17/2015 10:16:20 AM By: Christin Fudge MD, FACS Entered By: Christin Fudge on 11/17/2015 10:16:20 [name] ([id number]) -------------------------------------------------------------------------------- SuperBill Details Patient Name: [name] Date of Service: 11/17/2015 Medical Record Number: [id number] Patient Account Number: [id number] Date of Birth/Sex: 02-02-1946 (70 y.o.  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 hypertension (idiopathic) with ulcer of left lower extremity Z94.0 Kidney transplant status S41.112A Laceration without foreign body of left upper arm, initial encounter Facility Procedures CPT4 Code Description: [id number] - DEB SUBQ TISSUE 20 SQ CM/< ICD-10 Description Diagnosis E11.622 Type 2 diabetes mellitus with other skin 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 I73.9 Peripheral vascular disease,  unspecified I87.312 Chronic venous hypertension (idiopathic) with ulcer Modifier: er exposed of left lower Quantity: 1 extremity Physician Procedures CPT4 Code Description: [id number] - WC PHYS LEVEL 3 - EST PT ICD-10 Description Diagnosis E11.622 Type 2 diabetes mellitus with other skin ulcer S41.112A Laceration without foreign body of left upper arm, i Modifier: 25 nitial encounte Quantity: 1 r CPT4 Code Description: [id number] - WC PHYS SUBQ TISS 20 SQ CM ICD-10 Description Diagnosis E11.622 Type 2 diabetes mellitus with other skin 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

## 2015-11-24 ENCOUNTER — Encounter: Payer: Medicare Other | Attending: Surgery | Admitting: Surgery

## 2015-11-24 DIAGNOSIS — I4891 Unspecified atrial fibrillation: Secondary | ICD-10-CM | POA: Diagnosis not present

## 2015-11-24 DIAGNOSIS — X58XXXA Exposure to other specified factors, initial encounter: Secondary | ICD-10-CM | POA: Insufficient documentation

## 2015-11-24 DIAGNOSIS — N189 Chronic kidney disease, unspecified: Secondary | ICD-10-CM | POA: Diagnosis not present

## 2015-11-24 DIAGNOSIS — I739 Peripheral vascular disease, unspecified: Secondary | ICD-10-CM | POA: Insufficient documentation

## 2015-11-24 DIAGNOSIS — Z872 Personal history of diseases of the skin and subcutaneous tissue: Secondary | ICD-10-CM | POA: Diagnosis not present

## 2015-11-24 DIAGNOSIS — Z09 Encounter for follow-up examination after completed treatment for conditions other than malignant neoplasm: Secondary | ICD-10-CM | POA: Diagnosis not present

## 2015-11-24 DIAGNOSIS — E1122 Type 2 diabetes mellitus with diabetic chronic kidney disease: Secondary | ICD-10-CM | POA: Insufficient documentation

## 2015-11-24 DIAGNOSIS — I87312 Chronic venous hypertension (idiopathic) with ulcer of left lower extremity: Secondary | ICD-10-CM | POA: Diagnosis not present

## 2015-11-24 DIAGNOSIS — L97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er exposed: Secondary | ICD-10-CM | POA: Diagnosis not present

## 2015-11-24 DIAGNOSIS — Z940 Kidney transplant status: Secondary | ICD-10-CM | POA: Insufficient documentation

## 2015-11-24 DIAGNOSIS — E11622 Type 2 diabetes mellitus with other skin ulcer: Secondary | ICD-10-CM | POA: Insufficient documentation

## 2015-11-24 DIAGNOSIS — S41112A Laceration without foreign body of left upper arm, initial encounter: Secondary | ICD-10-CM | POA: Insufficient documentation

## 2015-11-24 NOTE — Progress Notes (Addendum)
JAMARIA, HOLYFIELD ([id number]) Visit Report for 11/24/2015 Chief Complaint Document Details Patient Name: [name], [name] Date of Service: 11/24/2015 8:45 AM Medical Record Number: [id number] Patient Account Number: [id number] Date of Birth/Sex: [birth year] (70 y.o. Male) Treating RN: Macarthur Critchley Primary Care Physician: [name] Other Clinician: Referring Physician: [name] Treating Physician/Extender: Frann Rider in Treatment: 8 Information Obtained from: Patient Chief Complaint Patient presents to the wound care center for a consult due non healing wound to the left lower extremity for the last 2 months. Electronic Signature(s) Signed: 11/24/2015 9:05:58 AM By: Christin Fudge MD, FACS Entered By: Christin Fudge on 11/24/2015 09:05:58 MALCOMB, ROELFS ([id number]) -------------------------------------------------------------------------------- HPI Details Patient Name: [name] Date of Service: 11/24/2015 8:45 AM Medical Record Number: [id number] Patient Account Number: [id number] Date of Birth/Sex: 1946/08/21 (70 y.o. Male) Treating RN: Macarthur Critchley Primary Care Physician: [name] Other Clinician: Referring Physician: [name] Treating Physician/Extender: Frann Rider in Treatment: 8 History of Present Illness Location: left lower leg anteriorly on his shin Quality: Patient reports experiencing a dull pain to affected area(s). Severity: Patient states wound are getting worse. Duration: Patient has had the wound for > 2 months prior to seeking treatment at the wound center Timing: Pain in wound is Intermittent (comes and goes Context: The wound occurred when the patient had a blunt injury with a golf cart 2 months ago Modifying Factors: Other treatment(s) tried include: he has been applying local ointments to this and once the scab came out he noticed a deep ulceration. Associated Signs and Symptoms: Patient reports  having difficulty standing for long periods. HPI Description: 70 year old gentleman who was recently seen by his nephrologist [name], and noted to have a wound on his left lower extremity which was lacerated 2 months ago and now has reopened. The patient's left shin has a ulceration with some exudate but no evidence of infection and he was referred to [place] for further care as it was known that the patient has had some peripheral vascular disease in the past. Past medical history significant for chronic kidney disease, atrial fibrillation, diabetes mellitus,status post kidney transplant in 1983 and 2005, a week fistula graft placement, status post previous bowel surgery. 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 10/06/2015 -- x-ray of the left tibia and fibula shows no evidence of osteomyelitis. The patient has also had Doppler studies of his extremity and is awaiting the appointment with the vascular surgeon. We have not yet received these reports. 10/13/2015 -- lower extremity venous duplex reflux evaluation shows reflux in the left common femoral vein, left saphenofemoral junction and the proximal greater saphenous vein extending to the proximal calf. There is also reflux in the left proximal to mid small saphenous vein. Arterial duplex studies done showed the resting ABI was not applicable due to tibial artery medial calcification. 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 The right TBI was 1 which is normal and the left ABI was 1 which is normal. Patient has otherwise been doing fine and has been compliant with his dressings. 10/20/2015 -- He was seen by [name] recently for a vascular opinion on 10/15/2015. His left lower extremity venous insufficiency duplex study revealed GSV reflux,SS vein reflux and deep venous  reflux in the common femoral vein. His ABIs were non compressible and his TBI on the right  was 1.01 and on the left was 0.80. He was asked to continue with the wound care with compressive therapy followed by EVLA of the left GS vein 3 months. He recommended 20-30 mm thigh-high compression stockings and the need for a three- month trial of this. The patient had an Unna boot applied at the vascular office but he could not tolerate this with a lot of pain and issues with his toes and hence came here on Friday for removal of this and we reapplied a 2 layer Voght, Krew ([id number]) compression. 11/10/2015 -- patient still has not purchased his 20-30 mm thigh-high compression stockings as prescribed by [name]. 11/17/2015 -- 3 days ago he injured his left forearm on a bedpost and has had a lacerated wound which she has been treating with Neosporin. Electronic Signature(s) Signed: 11/24/2015 9:06:03 AM By: Christin Fudge MD, FACS Entered By: Christin Fudge on 11/24/2015 09:06:03 JORDANN, LOUGHLIN ([id number]) -------------------------------------------------------------------------------- Physical Exam Details Patient Name: [name] Date of Service: 11/24/2015 8:45 AM Medical Record Number: [id number] Patient Account Number: [id number] Date of Birth/Sex: 09-30-1945 (70 y.o. Male) Treating RN: Macarthur Critchley Primary Care Physician: [name] Other Clinician: Referring Physician: [name] Treating Physician/Extender: Frann Rider in Treatment: 8 Constitutional . Pulse regular. Respirations normal and unlabored. Afebrile. . Eyes Nonicteric. Reactive to light. Ears, Nose, Mouth, and Throat Lips, teeth, and gums WNL.Marland Kitchen Moist mucosa without lesions. Neck supple and nontender. No palpable supraclavicular or cervical adenopathy. Normal sized without goiter. Respiratory WNL. No retractions.. Cardiovascular Pedal Pulses WNL. No clubbing, cyanosis or  edema. Lymphatic No adneopathy. No adenopathy. No adenopathy. Musculoskeletal Adexa without tenderness or enlargement.. Digits and nails w/o clubbing, cyanosis, infection, petechiae, ischemia, or inflammatory conditions.. Integumentary (Hair, Skin) No suspicious lesions. No crepitus or fluctuance. No peri-wound warmth or erythema. No masses.Marland Kitchen Psychiatric Judgement and insight Intact.. No evidence of depression, anxiety, or agitation.. Notes the wound on the left lower extremity is completely healed and there is no open areas. The wound of the left forearm where he had a laceration part of the wound is healed and we will continue local care. Electronic Signature(s) Signed: 11/24/2015 9:06:55 AM By: Christin Fudge MD, FACS Entered By: Christin Fudge on 11/24/2015 09:06:55 RUDY, AMINOV ([id number]) -------------------------------------------------------------------------------- Physician Orders Details Patient Name: [name] Date of Service: 11/24/2015 8:45 AM Medical Record Number: [id number] Patient Account Number: [id number] Date of Birth/Sex: Nov 30, 1945 (70 y.o. Male) Treating RN: Macarthur Critchley Primary Care Physician: [name] Other Clinician: Referring Physician: [name] Treating Physician/Extender: Frann Rider in Treatment: 8 Verbal / Phone Orders: Yes Clinician: Macarthur Critchley Read Back and Verified: Yes Diagnosis Coding ICD-10 Coding Code Description E11.622 Type 2 diabetes mellitus with other skin 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er exposed I73.9 Peripheral vascular disease, unspecified I87.312 Chronic venous hypertension (idiopathic) with ulcer of left lower extremity Z94.0 Kidney transplant status S41.112A Laceration without foreign body of left upper arm, initial encounter Wound Cleansing Wound #2 Left Forearm o May shower with protection. Primary Wound Dressing Wound #2 Left Forearm o  Other: - siltec sorbact Dressing Change Frequency Wound #2 Left Forearm o Change dressing every week - dont change dressing unless it gets wet Follow-up Appointments Wound #2 Left Forearm o Return Appointment in 1 week. Edema Control o Support Garment 20-30 mm/Hg pressure to: - continue to wear thigh high compression stockings Notes make an appointment to [facility] [phone] for eval of skin lesion to left leg Electronic Signature(s) Signed: 11/24/2015 3:39:32  PM By: Rebecca Eaton RN, Roslynn Amble Signed: 11/24/2015 3:57:47 PM By: Christin Fudge MD, [place], Tammi Klippel ([id number]) Entered By: Rebecca Eaton RN, [place] on 11/24/2015 09:09:20 HUSTON, DITTUS ([id number]) -------------------------------------------------------------------------------- Problem List Details Patient Name: [name] Date of Service: 11/24/2015 8:45 AM Medical Record Number: [id number] Patient Account Number: [id number] Date of Birth/Sex: 1946/04/15 (70 y.o. Male) Treating RN: Macarthur Critchley Primary Care Physician: [name] Other Clinician: Referring Physician: [name] Treating Physician/Extender: Frann Rider in Treatment: 8 Active Problems ICD-10 Encounter Code Description Active Date Diagnosis E11.622 Type 2 diabetes mellitus with other skin ulcer 09/29/2015 Yes 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er 09/29/2015 Yes exposed I73.9 Peripheral vascular disease, unspecified 09/29/2015 Yes I87.312 Chronic venous hypertension (idiopathic) with ulcer of left 09/29/2015 Yes lower extremity Z94.0 Kidney transplant status 09/29/2015 Yes S41.112A Laceration without foreign body of left upper arm, initial 11/17/2015 Yes encounter Inactive Problems Resolved Problems Electronic Signature(s) Signed: 11/24/2015 9:05:51 AM By: Christin Fudge MD, FACS Entered By: Christin Fudge on 11/24/2015 09:05:51 [name]  ([id number]) -------------------------------------------------------------------------------- Progress Note Details Patient Name: [name] Date of Service: 11/24/2015 8:45 AM Medical Record Number: [id number] Patient Account Number: [id number] Date of Birth/Sex: November 08, 1945 (70 y.o. Male) Treating RN: Macarthur Critchley Primary Care Physician: [name] Other Clinician: Referring Physician: [name] Treating Physician/Extender: Frann Rider in Treatment: 8 Subjective Chief Complaint Information obtained from Patient Patient presents to the wound care center for a consult due non healing wound to the left lower extremity for the last 2 months. History of Present Illness (HPI) The following HPI elements were documented for the patient's wound: Location: left lower leg anteriorly on his shin Quality: Patient reports experiencing a dull pain to affected area(s). Severity: Patient states wound are getting worse. Duration: Patient has had the wound for > 2 months prior to seeking treatment at the wound center Timing: Pain in wound is Intermittent (comes and goes Context: The wound occurred when the patient had a blunt injury with a golf cart 2 months ago Modifying Factors: Other treatment(s) tried include: he has been applying local ointments to this and once the scab came out he noticed a deep ulceration. Associated Signs and Symptoms: Patient reports having difficulty standing for long periods. 70 year old gentleman who was recently seen by his nephrologist [name], and noted to have a wound on his left lower extremity which was lacerated 2 months ago and now has reopened. The patient's left shin has a ulceration with some exudate but no evidence of infection and he was referred to [place] for further care as it was known that the patient has had some peripheral vascular disease in the past. Past medical history significant for chronic kidney disease,  atrial fibrillation, diabetes mellitus,status post kidney transplant in 1983 and 2005, a week fistula graft placement, status post previous bowel surgery. 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 10/06/2015 -- x-ray of the left tibia and fibula shows no evidence of osteomyelitis. The patient has also had Doppler studies of his extremity and is awaiting the appointment with the vascular surgeon. We have not yet received these reports. 10/13/2015 -- lower extremity venous duplex reflux evaluation shows reflux in the left common femoral vein, left saphenofemoral junction and the proximal greater saphenous vein extending to the proximal calf. There is also reflux in the left proximal to mid small saphenous vein. Arterial duplex studies done showed the resting ABI was not applicable due to tibial  artery medial calcification. 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 The right TBI was 1 which is normal and the left ABI was 1 which is normal. Patient has otherwise been doing fine and has been compliant with his dressings. 10/20/2015 -- He was seen by [name] recently for a vascular opinion on 10/15/2015. His left lower extremity venous insufficiency duplex study revealed GSV reflux,SS vein reflux and deep venous reflux in [place], [name] the common femoral vein. His ABIs were non compressible and his TBI on the right was 1.01 and on the left was 0.80. He was asked to continue with the wound care with compressive therapy followed by EVLA of the left GS vein 3 months. He recommended 20-30 mm thigh-high compression stockings and the need for a three- month trial of this. The patient had an Unna boot applied at the vascular office but he could not tolerate this with a lot of pain and issues with his toes and hence came here on  Friday for removal of this and we reapplied a 2 layer compression. 11/10/2015 -- patient still has not purchased his 20-30 mm thigh-high compression stockings as prescribed by [name]. 11/17/2015 -- 3 days ago he injured his left forearm on a bedpost and has had a lacerated wound which she has been treating with Neosporin. Objective Constitutional Pulse regular. Respirations normal and unlabored. Afebrile. Vitals Time Taken: 8:50 AM, Height: 68 in, Weight: 178 lbs, BMI: 27.1, Temperature: 97.9 F, Pulse: 78 bpm, Blood Pressure: 185/94 mmHg. Eyes Nonicteric. Reactive to light. Ears, Nose, Mouth, and Throat Lips, teeth, and gums WNL.Marland Kitchen Moist mucosa without lesions. Neck supple and nontender. No palpable supraclavicular or cervical adenopathy. Normal sized without goiter. Respiratory WNL. No retractions.. Cardiovascular Pedal Pulses WNL. No clubbing, cyanosis or edema. Lymphatic No adneopathy. No adenopathy. No adenopathy. Musculoskeletal Adexa without tenderness or enlargement.. Digits and nails w/o clubbing, cyanosis, infection, petechiae, ischemia, or inflammatory conditions.TAVAUGHN, LISIECKI ([id number]) Psychiatric Judgement and insight Intact.. No evidence of depression, anxiety, or agitation.. General Notes: the wound on the left lower extremity is completely healed and there is no open areas. The wound of the left forearm where he had a laceration part of the wound is healed and we will continue local care. Integumentary (Hair, Skin) No suspicious lesions. No crepitus or fluctuance. No peri-wound warmth or erythema. No masses.. Wound #1 status is Healed - Epithelialized. Original cause of wound was Trauma. The wound is located on the Left,Midline,Anterior Lower Leg. The wound measures 0cm length x 0cm width x 0cm depth; 0cm^2 area and 0cm^3 volume. There is no tunneling or undermining noted. There is a none present amount of drainage noted. The wound margin is flat and  intact. There is large (67-100%) granulation within the wound bed. There is no necrotic tissue within the wound bed. Wound #2 status is Open. Original cause of wound was Trauma. The wound is located on the Left Forearm. The wound measures 1.1cm length x 0.3cm width x 0.1cm depth; 0.259cm^2 area and 0.026cm^3 volume. The wound is limited to skin breakdown. There is no tunneling or undermining noted. There is a small amount of serosanguineous drainage noted. The wound margin is well defined and not attached to the wound base. There is large (67-100%) red granulation within the wound bed. There is no necrotic tissue within the wound bed. The periwound skin appearance did not exhibit:  Callus, Crepitus, Excoriation, Fluctuance, Friable, Induration, Localized Edema, Rash, Scarring, Dry/Scaly, Maceration, Moist, Atrophie Blanche, Cyanosis, Ecchymosis, Hemosiderin Staining, Mottled, Pallor, Rubor, Erythema. Assessment Active Problems ICD-10 E11.622 - Type 2 diabetes mellitus with other skin ulcer L97.222 - Non-pressure chronic ulcer of left calf with fat layer exposed I73.9 - Peripheral vascular disease, unspecified I87.312 - Chronic venous hypertension (idiopathic) with ulcer of left lower extremity Z94.0 - Kidney transplant status S41.112A - Laceration without foreign body of left upper arm, initial encounter Plan Wound Cleansing: Wound #2 Left Forearm: ALEPH, SPARHAWK ([id number]) May shower with protection. Primary Wound Dressing: Wound #2 Left Forearm: Other: - siltec sorbact Dressing Change Frequency: Wound #2 Left Forearm: Change dressing every week - dont change dressing unless it gets wet Follow-up Appointments: Wound #2 Left Forearm: Return Appointment in 1 week. Edema Control: Support Garment 20-30 mm/Hg pressure to: - continue to wear thigh high compression stockings General Notes: make an appointment to [facility] dermat[facility] associates [phone] for eval of skin lesion to  left leg For his left lower extremity the wound is completely healed and hence he will use his compression stockings of the 20-30 mm variety. he will follow-up with his vascular surgeons. He also has a skin lesion on the medial upper calf which is suspicious for a BCC order SCC and I have recommended a dermatology excision for this. For his left forearm I have recommended Siltec Sorbact to be applied and a Kerlix and Coban dressing to be placed over this. I have asked him not to change this dressing unless wet. He'll come back and see as next week. Electronic Signature(s) Signed: 11/24/2015 4:00:38 PM By: Christin Fudge MD, FACS Previous Signature: 11/24/2015 9:08:50 AM Version By: Christin Fudge MD, FACS Entered By: Christin Fudge on 11/24/2015 16:00:37 HUZAIFAH, HOGGATT ([id number]) -------------------------------------------------------------------------------- SuperBill Details Patient Name: [name] Date of Service: 11/24/2015 Medical Record Number: [id number] Patient Account Number: [id number] Date of Birth/Sex: [birth year] (70 y.o. 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 Z94.0 Kidney transplant status S41.112A Laceration without foreign body of left upper arm, initial encounter Facility Procedures CPT4 Code: [id number] Description: 99213 - WOUND CARE VISIT-LEV 3 EST PT Modifier: Quantity: 1 Physician Procedures CPT4 Code Description: [id number] - WC PHYS LEVEL 3 - EST PT ICD-10 Description Diagnosis E11.622 Type 2  diabetes mellitus with other skin ulcer L97.222 Non-pressure chronic ulcer of left calf with fat la S41.112A Laceration without foreign body of  left upper arm, Modifier: yer exposed initial encount Quantity: 1 er Engineer, maintenance) Signed: 11/24/2015 3:39:32 PM By: Ardean Larsen Signed: 11/24/2015 3:57:47 PM By: Christin Fudge MD, FACS Previous Signature: 11/24/2015 9:09:02 AM Version By: Christin Fudge MD, FACS Entered By: Rebecca Eaton RN, Roslynn Amble on 11/24/2015 09:14:18

## 2015-11-24 NOTE — Progress Notes (Signed)
HARLYM, LAUDERDALE ([id number]) Visit Report for 11/24/2015 Arrival Information Details Patient Name: [name], [name] Date of Service: 11/24/2015 8:45 AM Medical Record Number: [id number] Patient Account Number: [id number] Date of Birth/Sex: [birth year] (70 y.o. Male) Treating RN: Macarthur Critchley Primary Care Physician: [name] Other Clinician: Referring Physician: [name] Treating Physician/Extender: Frann Rider in Treatment: 8 Visit Information History Since Last Visit All ordered tests and consults were completed: No Patient Arrived: Ambulatory Added or deleted any medications: No Arrival Time: 08:49 Any new allergies or adverse reactions: No Accompanied By: self Had a fall or experienced change in No Transfer Assistance: None activities of daily living that may affect Patient Requires Transmission-Based No risk of falls: Precautions: Signs or symptoms of abuse/neglect since last No Patient Has Alerts: Yes visito Hospitalized since last visit: No Has Dressing in Place as Prescribed: Yes Has Compression in Place as Prescribed: Yes Pain Present Now: No Electronic Signature(s) Signed: 11/24/2015 3:39:32 PM By: Rebecca Eaton, RN, Sendra Entered By: Rebecca Eaton RN, Sendra on 11/24/2015 08:50:12 [name] ([id number]) -------------------------------------------------------------------------------- Clinic Level of Care Assessment Details Patient Name: [name] Date of Service: 11/24/2015 8:45 AM Medical Record Number: [id number] Patient Account Number: [id number] Date of Birth/Sex: 11/26/1945 (70 y.o. Male) Treating RN: Macarthur Critchley Primary Care Physician: [name] Other Clinician: Referring Physician: [name] Treating Physician/Extender: Frann Rider in Treatment: 8 Clinic Level of Care Assessment Items TOOL 4 Quantity Score X - Use when only an EandM is performed on FOLLOW-UP visit 1 0 ASSESSMENTS - Nursing  Assessment / Reassessment X - Reassessment of Co-morbidities (includes updates in patient status) 1 10 X - Reassessment of Adherence to Treatment Plan 1 5 ASSESSMENTS - Wound and Skin Assessment / Reassessment []  - Simple Wound Assessment / Reassessment - one wound 0 X - Complex Wound Assessment / Reassessment - multiple wounds 2 5 []  - Dermatologic / Skin Assessment (not related to wound area) 0 ASSESSMENTS - Focused Assessment X - Circumferential Edema Measurements - multi extremities 1 5 []  - Nutritional Assessment / Counseling / Intervention 0 X - Lower Extremity Assessment (monofilament, tuning fork, pulses) 1 5 []  - Peripheral Arterial Disease Assessment (using hand held doppler) 0 ASSESSMENTS - Ostomy and/or Continence Assessment and Care []  - Incontinence Assessment and Management 0 []  - Ostomy Care Assessment and Management (repouching, etc.) 0 PROCESS - Coordination of Care X - Simple Patient / Family Education for ongoing care 1 15 []  - Complex (extensive) Patient / Family Education for ongoing care 0 X - Staff obtains Programmer, systems, Records, Test Results / Process Orders 1 10 []  - Staff telephones HHA, Nursing Homes / Clarify orders / etc 0 []  - Routine Transfer to another Facility (non-emergent condition) 0 [name], [name] ([id number]) []  - Routine Hospital Admission (non-emergent condition) 0 []  - New Admissions / Biomedical engineer / Ordering NPWT, Apligraf, etc. 0 []  - Emergency Hospital Admission (emergent condition) 0 X - Simple Discharge Coordination 1 10 []  - Complex (extensive) Discharge Coordination 0 PROCESS - Special Needs []  - Pediatric / Minor Patient Management 0 []  - Isolation Patient Management 0 []  - Hearing / Language / Visual special needs 0 []  - Assessment of Community assistance (transportation, D/C planning, etc.) 0 []  - Additional assistance / Altered mentation 0 []  - Support Surface(s) Assessment (bed, cushion, seat, etc.) 0 INTERVENTIONS -  Wound Cleansing / Measurement []  - Simple Wound Cleansing - one wound 0 X - Complex Wound Cleansing - multiple wounds 2 5 X - Wound Imaging (photographs - any number  of wounds) 1 5 []  - Wound Tracing (instead of photographs) 0 []  - Simple Wound Measurement - one wound 0 X - Complex Wound Measurement - multiple wounds 2 5 INTERVENTIONS - Wound Dressings X - Small Wound Dressing one or multiple wounds 1 10 []  - Medium Wound Dressing one or multiple wounds 0 []  - Large Wound Dressing one or multiple wounds 0 []  - Application of Medications - topical 0 []  - Application of Medications - injection 0 INTERVENTIONS - Miscellaneous []  - External ear exam 0 , [name] ([id number]) []  - Specimen Collection (cultures, biopsies, blood, body fluids, etc.) 0 []  - Specimen(s) / Culture(s) sent or taken to Lab for analysis 0 []  - Patient Transfer (multiple staff / Harrel Lemon Lift / Similar devices) 0 []  - Simple Staple / Suture removal (25 or less) 0 []  - Complex Staple / Suture removal (26 or more) 0 []  - Hypo / Hyperglycemic Management (close monitor of Blood Glucose) 0 []  - Ankle / Brachial Index (ABI) - do not check if billed separately 0 X - Vital Signs 1 5 Has the patient been seen at the hospital within the last three years: Yes Total Score: 110 Level Of Care: New/Established - Level 3 Electronic Signature(s) Signed: 11/24/2015 3:39:32 PM By: Rebecca Eaton, RN, Sendra Entered By: Rebecca Eaton, RN, Sendra on 11/24/2015 09:14:05 [name] ([id number]) -------------------------------------------------------------------------------- Encounter Discharge Information Details Patient Name: [name] Date of Service: 11/24/2015 8:45 AM Medical Record Number: [id number] Patient Account Number: [id number] Date of Birth/Sex: [birth year] (70 y.o. Male) Treating RN: Macarthur Critchley Primary Care Physician: [name] Other Clinician: Referring Physician: [name] Treating  Physician/Extender: Frann Rider in Treatment: 8 Encounter Discharge Information Items Discharge Pain Level: 0 Discharge Condition: Stable Ambulatory Status: Ambulatory Discharge Destination: Home Transportation: Private Auto Accompanied By: self Schedule Follow-up Appointment: Yes Medication Reconciliation completed and provided to Patient/Care Yes [name]: Provided on Clinical Summary of Care: 11/24/2015 Form Type Recipient Paper Patient AR Electronic Signature(s) Signed: 11/24/2015 9:10:36 AM By: Ruthine Dose Entered By: Ruthine Dose on 11/24/2015 09:10:35 [name] ([id number]) -------------------------------------------------------------------------------- Lower Extremity Assessment Details Patient Name: [name] Date of Service: 11/24/2015 8:45 AM Medical Record Number: [id number] Patient Account Number: [id number] Date of Birth/Sex: November 19, 1945 (70 y.o. Male) Treating RN: Macarthur Critchley Primary Care Physician: [name] Other Clinician: Referring Physician: [name] Treating Physician/Extender: Frann Rider in Treatment: 8 Edema Assessment Assessed: [Left: No] [Right: No] E[Left: dema] [Right: :] Calf Left: Right: Point of Measurement: 32 cm From Medial Instep 37 cm cm Ankle Left: Right: Point of Measurement: 10 cm From Medial Instep 22 cm cm Vascular Assessment Pulses: Posterior Tibial Dorsalis Pedis Palpable: [Left:Yes] Extremity colors, hair growth, and conditions: Extremity Color: [Left:Mottled] Hair Growth on Extremity: [Left:Yes] Temperature of Extremity: [Left:Warm] Capillary Refill: [Left:< 3 seconds] Toe Nail Assessment Left: Right: Thick: No Discolored: No Deformed: No Improper Length and Hygiene: No Electronic Signature(s) Signed: 11/24/2015 3:39:32 PM By: Rebecca Eaton, RN, Sendra Entered By: Rebecca Eaton RN, Sendra on 11/24/2015 08:57:33 [name]  ([id number]) -------------------------------------------------------------------------------- Pain Assessment Details Patient Name: [name] Date of Service: 11/24/2015 8:45 AM Medical Record Number: [id number] Patient Account Number: [id number] Date of Birth/Sex: 12/18/1945 (70 y.o. Male) Treating RN: Macarthur Critchley Primary Care Physician: [name] Other Clinician: Referring Physician: [name] Treating Physician/Extender: Frann Rider in Treatment: 8 Active Problems Location of Pain Severity and Description of Pain Patient Has Paino No Site Locations Rate the pain. Current Pain Level: 0 Pain Management and Medication Current Pain Management: Electronic Signature(s) Signed:  11/24/2015 3:39:32 PM By: Rebecca Eaton, RN, Romero Liner By: Rebecca Eaton RN, Sendra on 11/24/2015 08:51:00 [name] ([id number]) -------------------------------------------------------------------------------- Patient/Caregiver Education Details Patient Name: [name] Date of Service: 11/24/2015 8:45 AM Medical Record Number: [id number] Patient Account Number: [id number] Date of Birth/Gender: 04/07/1946 (70 y.o. Male) Treating RN: Macarthur Critchley Primary Care Physician: [name] Other Clinician: Referring Physician: [name] Treating Physician/Extender: Frann Rider in Treatment: 8 Education Assessment Education Provided To: Patient Education Topics Provided Venous: Handouts: Controlling Swelling with Compression Stockings Methods: Explain/Verbal Responses: State content correctly Wound/Skin Impairment: Electronic Signature(s) Signed: 11/24/2015 3:39:32 PM By: Rebecca Eaton, RN, Sendra Entered By: Rebecca Eaton RN, Sendra on 11/24/2015 09:10:08 [name] ([id number]) -------------------------------------------------------------------------------- Wound Assessment Details Patient Name: [name] Date of Service: 11/24/2015 8:45  AM Medical Record Number: [id number] Patient Account Number: [id number] Date of Birth/Sex: [birth year] (70 y.o. Male) Treating RN: Macarthur Critchley Primary Care Physician: [name] Other Clinician: Referring Physician: [name] Treating Physician/Extender: Frann Rider in Treatment: 8 Wound Status Wound Number: 1 Primary Trauma, Other Etiology: Wound Location: Left Lower Leg - Midline, Anterior Wound Healed - Epithelialized Status: Wounding Event: Trauma Comorbid Cataracts, Glaucoma, Anemia, Date Acquired: 07/30/2015 History: Arrhythmia, Type II Diabetes, Weeks Of Treatment: 8 Osteoarthritis, Neuropathy Clustered Wound: No Photos Wound Measurements Length: (cm) 0 % Reduction i Width: (cm) 0 % Reduction i Depth: (cm) 0 Epithelializa Area: (cm) 0 Tunneling: Volume: (cm) 0 Undermining: n Area: 100% n Volume: 100% tion: Large (67-100%) No No Wound Description Full Thickness Without Classification: Exposed Support Structures Diabetic Severity Grade 0 (Wagner): Wound Margin: Flat and Intact Exudate Amount: None Present Foul Odor After Cleansing: No Wound Bed Granulation Amount: Large (67-100%) Necrotic Amount: None Present (0%) Periwound Skin Texture [name], [name] ([id number]) Texture Color No Abnormalities Noted: No No Abnormalities Noted: No Moisture No Abnormalities Noted: No Wound Preparation Ulcer Cleansing: Rinsed/Irrigated with Saline Topical Anesthetic Applied: None Electronic Signature(s) Signed: 11/24/2015 3:39:32 PM By: Rebecca Eaton, RN, Sendra Entered By: Rebecca Eaton, RN, Sendra on 11/24/2015 15:38:05 [name] ([id number]) -------------------------------------------------------------------------------- Wound Assessment Details Patient Name: [name] Date of Service: 11/24/2015 8:45 AM Medical Record Number: [id number] Patient Account Number: [id number] Date of Birth/Sex: 1946/07/07 (70 y.o. Male) Treating RN:  Macarthur Critchley Primary Care Physician: [name] Other Clinician: Referring Physician: [name] Treating Physician/Extender: Frann Rider in Treatment: 8 Wound Status Wound Number: 2 Primary Skin Tear Etiology: Wound Location: Left Forearm Wound Open Wounding Event: Trauma Status: Date Acquired: 11/13/2015 Comorbid Cataracts, Glaucoma, Anemia, Weeks Of Treatment: 1 History: Arrhythmia, Type II Diabetes, Clustered Wound: No Osteoarthritis, Neuropathy Photos Wound Measurements Length: (cm) 1.1 Width: (cm) 0.3 Depth: (cm) 0.1 Area: (cm) 0.259 Volume: (cm) 0.026 % Reduction in Area: 89.2% % Reduction in Volume: 89.2% Epithelialization: None Tunneling: No Undermining: No Wound Description Classification: Partial Thickness Wound Margin: Well defined, not attached Exudate Amount: Small Exudate Type: Serosanguineous Exudate Color: red, brown Wound Bed Granulation Amount: Large (67-100%) Exposed Structure Granulation Quality: Red Fascia Exposed: No Necrotic Amount: None Present (0%) Fat Layer Exposed: No Tendon Exposed: No Muscle Exposed: No [name], [name] ([id number]) Joint Exposed: No Bone Exposed: No Limited to Skin Breakdown Periwound Skin Texture Texture Color No Abnormalities Noted: No No Abnormalities Noted: No Callus: No Atrophie Blanche: No Crepitus: No Cyanosis: No Excoriation: No Ecchymosis: No Fluctuance: No Erythema: No Friable: No Hemosiderin Staining: No Induration: No Mottled: No Localized Edema: No Pallor: No Rash: No Rubor: No Scarring: No Moisture No Abnormalities Noted: No Dry / Scaly: No Maceration: No Moist: No Wound Preparation Ulcer  Cleansing: Rinsed/Irrigated with Saline Topical Anesthetic Applied: None Treatment Notes Wound #2 (Left Forearm) 1. Cleansed with: Clean wound with Normal Saline 4. Dressing Applied: Other dressing (specify in notes) Notes siltet sorbact, keep  dry Electronic Signature(s) Signed: 11/24/2015 3:39:32 PM By: Rebecca Eaton, RN, Sendra Entered By: Rebecca Eaton RN, Sendra on 11/24/2015 15:37:39 [name], [name] ([id number]) -------------------------------------------------------------------------------- Vitals Details Patient Name: [name] Date of Service: 11/24/2015 8:45 AM Medical Record Number: [id number] Patient Account Number: [id number] Date of Birth/Sex: Feb 22, 1946 (70 y.o. Male) Treating RN: Macarthur Critchley Primary Care Physician: [name] Other Clinician: Referring Physician: [name] Treating Physician/Extender: Frann Rider in Treatment: 8 Vital Signs Time Taken: 08:50 Temperature (F): 97.9 Height (in): 68 Pulse (bpm): 78 Weight (lbs): 178 Blood Pressure (mmHg): 185/94 Body Mass Index (BMI): 27.1 Reference Range: 80 - 120 mg / dl Electronic Signature(s) Signed: 11/24/2015 3:39:32 PM By: Rebecca Eaton RN, Sendra Entered By: Rebecca Eaton RN, Sendra on 11/24/2015 09:03:10

## 2015-11-27 DIAGNOSIS — D0472 Carcinoma in situ of skin of left lower limb, including hip: Secondary | ICD-10-CM | POA: Diagnosis not present

## 2015-11-27 DIAGNOSIS — D485 Neoplasm of uncertain behavior of skin: Secondary | ICD-10-CM | POA: Diagnosis not present

## 2015-12-01 ENCOUNTER — Encounter: Payer: Medicare Other | Admitting: Surgery

## 2015-12-01 DIAGNOSIS — L97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er exposed: Secondary | ICD-10-CM | POA: Diagnosis not present

## 2015-12-01 DIAGNOSIS — E1122 Type 2 diabetes mellitus with diabetic chronic kidney disease: Secondary | ICD-10-CM | POA: Diagnosis not present

## 2015-12-01 DIAGNOSIS — E11622 Type 2 diabetes mellitus with other skin ulcer: Secondary | ICD-10-CM | POA: Diagnosis not present

## 2015-12-01 DIAGNOSIS — Z940 Kidney transplant status: Secondary | ICD-10-CM | POA: Diagnosis not present

## 2015-12-01 DIAGNOSIS — I87312 Chronic venous hypertension (idiopathic) with ulcer of left lower extremity: Secondary | ICD-10-CM | POA: Diagnosis not present

## 2015-12-01 DIAGNOSIS — I739 Peripheral vascular disease, unspecified: Secondary | ICD-10-CM | POA: Diagnosis not present

## 2015-12-01 DIAGNOSIS — S41112A Laceration without foreign body of left upper arm, initial encounter: Secondary | ICD-10-CM | POA: Diagnosis not present

## 2015-12-01 DIAGNOSIS — L98491 Non-pressure chronic ulcer of skin of other sites limited to breakdown of skin: Secondary | ICD-10-CM | POA: Diagnosis not present

## 2015-12-01 NOTE — Progress Notes (Signed)
CHED, NEDROW ([id number]) Visit Report for 12/01/2015 Arrival Information Details Patient Name: [name], [name] Date of Service: 12/01/2015 8:45 AM Medical Record Number: [id number] Patient Account Number: [id number] Date of Birth/Sex: May 19, 1946 (70 y.o. Male) Treating RN: Macarthur Critchley Primary Care Physician: [name] Other Clinician: Referring Physician: [name] Treating Physician/Extender: Frann Rider in Treatment: 9 Visit Information History Since Last Visit All ordered tests and consults were completed: No Patient Arrived: Ambulatory Added or deleted any medications: No Arrival Time: 08:46 Any new allergies or adverse reactions: No Accompanied By: self Had a fall or experienced change in No Transfer Assistance: None activities of daily living that may affect Patient Identification Verified: Yes risk of falls: Secondary Verification Process Yes Signs or symptoms of abuse/neglect since last No Completed: visito Patient Requires Transmission-Based No Hospitalized since last visit: No Precautions: Has Dressing in Place as Prescribed: Yes Patient Has Alerts: Yes Has Compression in Place as Prescribed: Yes Pain Present Now: No Electronic Signature(s) Signed: 12/01/2015 2:46:25 PM By: Rebecca Eaton, RN, Sendra Entered By: Rebecca Eaton RN, Sendra on 12/01/2015 08:47:17 [name] ([id number]) -------------------------------------------------------------------------------- Clinic Level of Care Assessment Details Patient Name: [name] Date of Service: 12/01/2015 8:45 AM Medical Record Number: [id number] Patient Account Number: [id number] Date of Birth/Sex: Feb 24, 1946 (70 y.o. Male) Treating RN: Macarthur Critchley Primary Care Physician: [name] Other Clinician: Referring Physician: [name] Treating Physician/Extender: Frann Rider in Treatment: 9 Clinic Level of Care Assessment Items TOOL 4 Quantity  Score X - Use when only an EandM is performed on FOLLOW-UP visit 1 0 ASSESSMENTS - Nursing Assessment / Reassessment X - Reassessment of Co-morbidities (includes updates in patient status) 1 10 X - Reassessment of Adherence to Treatment Plan 1 5 ASSESSMENTS - Wound and Skin Assessment / Reassessment X - Simple Wound Assessment / Reassessment - one wound 1 5 []  - Complex Wound Assessment / Reassessment - multiple wounds 0 []  - Dermatologic / Skin Assessment (not related to wound area) 0 ASSESSMENTS - Focused Assessment []  - Circumferential Edema Measurements - multi extremities 0 []  - Nutritional Assessment / Counseling / Intervention 0 []  - Lower Extremity Assessment (monofilament, tuning fork, pulses) 0 []  - Peripheral Arterial Disease Assessment (using hand held doppler) 0 ASSESSMENTS - Ostomy and/or Continence Assessment and Care []  - Incontinence Assessment and Management 0 []  - Ostomy Care Assessment and Management (repouching, etc.) 0 PROCESS - Coordination of Care X - Simple Patient / Family Education for ongoing care 1 15 []  - Complex (extensive) Patient / Family Education for ongoing care 0 X - Staff obtains Programmer, systems, Records, Test Results / Process Orders 1 10 []  - Staff telephones HHA, Nursing Homes / Clarify orders / etc 0 []  - Routine Transfer to another Facility (non-emergent condition) 0 JAURICE, SCHIEFFER ([id number]) []  - Routine Hospital Admission (non-emergent condition) 0 []  - New Admissions / Biomedical engineer / Ordering NPWT, Apligraf, etc. 0 []  - Emergency Hospital Admission (emergent condition) 0 X - Simple Discharge Coordination 1 10 []  - Complex (extensive) Discharge Coordination 0 PROCESS - Special Needs []  - Pediatric / Minor Patient Management 0 []  - Isolation Patient Management 0 []  - Hearing / Language / Visual special needs 0 []  - Assessment of Community assistance (transportation, D/C planning, etc.) 0 []  - Additional assistance / Altered  mentation 0 []  - Support Surface(s) Assessment (bed, cushion, seat, etc.) 0 INTERVENTIONS - Wound Cleansing / Measurement X - Simple Wound Cleansing - one wound 1 5 []  - Complex Wound Cleansing - multiple wounds 0 X -  Wound Imaging (photographs - any number of wounds) 1 5 []  - Wound Tracing (instead of photographs) 0 X - Simple Wound Measurement - one wound 1 5 []  - Complex Wound Measurement - multiple wounds 0 INTERVENTIONS - Wound Dressings X - Small Wound Dressing one or multiple wounds 1 10 []  - Medium Wound Dressing one or multiple wounds 0 []  - Large Wound Dressing one or multiple wounds 0 []  - Application of Medications - topical 0 []  - Application of Medications - injection 0 INTERVENTIONS - Miscellaneous []  - External ear exam 0 Sharrow, Mattie ([id number]) []  - Specimen Collection (cultures, biopsies, blood, body fluids, etc.) 0 []  - Specimen(s) / Culture(s) sent or taken to Lab for analysis 0 []  - Patient Transfer (multiple staff / Harrel Lemon Lift / Similar devices) 0 []  - Simple Staple / Suture removal (25 or less) 0 []  - Complex Staple / Suture removal (26 or more) 0 []  - Hypo / Hyperglycemic Management (close monitor of Blood Glucose) 0 []  - Ankle / Brachial Index (ABI) - do not check if billed separately 0 X - Vital Signs 1 5 Has the patient been seen at the hospital within the last three years: Yes Total Score: 85 Level Of Care: New/Established - Level 3 Electronic Signature(s) Signed: 12/01/2015 2:46:25 PM By: Rebecca Eaton, RN, Sendra Entered By: Rebecca Eaton RN, Sendra on 12/01/2015 10:41:53 [name] ([id number]) -------------------------------------------------------------------------------- Encounter Discharge Information Details Patient Name: [name] Date of Service: 12/01/2015 8:45 AM Medical Record Number: [id number] Patient Account Number: [id number] Date of Birth/Sex: 1946-03-16 (70 y.o. Male) Treating RN: Macarthur Critchley Primary Care Physician:  [name] Other Clinician: Referring Physician: [name] Treating Physician/Extender: Frann Rider in Treatment: 9 Encounter Discharge Information Items Discharge Pain Level: 0 Discharge Condition: Stable Ambulatory Status: Ambulatory Discharge Destination: Home Transportation: Private Auto Accompanied By: self Schedule Follow-up Appointment: Yes Medication Reconciliation completed and provided to Patient/Care Yes Dorice Stiggers: Provided on Clinical Summary of Care: 12/01/2015 Form Type Recipient Paper Patient AR Electronic Signature(s) Signed: 12/01/2015 9:06:50 AM By: Sharon Mt Entered By: Sharon Mt on 12/01/2015 09:06:50 [name] ([id number]) -------------------------------------------------------------------------------- Lower Extremity Assessment Details Patient Name: [name] Date of Service: 12/01/2015 8:45 AM Medical Record Number: [id number] Patient Account Number: [id number] Date of Birth/Sex: Jul 21, 1946 (70 y.o. Male) Treating RN: Macarthur Critchley Primary Care Physician: [name] Other Clinician: Referring Physician: [name] Treating Physician/Extender: Frann Rider in Treatment: 9 Edema Assessment Assessed: [Left: No] [Right: No] Edema: [Left: N] [Right: o] Electronic Signature(s) Signed: 12/01/2015 2:46:25 PM By: Rebecca Eaton, RN, Sendra Entered By: Rebecca Eaton RN, Sendra on 12/01/2015 08:53:06 [name] ([id number]) -------------------------------------------------------------------------------- Pain Assessment Details Patient Name: [name] Date of Service: 12/01/2015 8:45 AM Medical Record Number: [id number] Patient Account Number: [id number] Date of Birth/Sex: [birth year] (70 y.o. Male) Treating RN: Macarthur Critchley Primary Care Physician: [name] Other Clinician: Referring Physician: [name] Treating Physician/Extender: Frann Rider in Treatment:  9 Active Problems Location of Pain Severity and Description of Pain Patient Has Paino No Site Locations Rate the pain. Current Pain Level: 0 Pain Management and Medication Current Pain Management: Electronic Signature(s) Signed: 12/01/2015 2:46:25 PM By: Rebecca Eaton, RN, Sendra Entered By: Rebecca Eaton RN, Sendra on 12/01/2015 08:47:27 DANIELS, THOMA ([id number]) -------------------------------------------------------------------------------- Patient/Caregiver Education Details Patient Name: [name] Date of Service: 12/01/2015 8:45 AM Medical Record Number: [id number] Patient Account Number: [id number] Date of Birth/Gender: [birth year] (70 y.o. Male) Treating RN: Macarthur Critchley Primary Care Physician: [name] Other Clinician: Referring Physician: [name] Treating Physician/Extender: Frann Rider in Treatment:  9 Education Assessment Education Provided To: Patient Education Topics Provided Wound/Skin Impairment: Handouts: Caring for Your Ulcer, Skin Care Do's and Dont's Methods: Explain/Verbal Responses: State content correctly Electronic Signature(s) Signed: 12/01/2015 2:46:25 PM By: Rebecca Eaton, RN, Sendra Entered By: Rebecca Eaton, RN, Sendra on 12/01/2015 08:53:50 [name] ([id number]) -------------------------------------------------------------------------------- Wound Assessment Details Patient Name: [name] Date of Service: 12/01/2015 8:45 AM Medical Record Number: [id number] Patient Account Number: [id number] Date of Birth/Sex: 1945/09/27 (70 y.o. Male) Treating RN: Macarthur Critchley Primary Care Physician: [name] Other Clinician: Referring Physician: [name] Treating Physician/Extender: Frann Rider in Treatment: 9 Wound Status Wound Number: 2 Primary Skin Tear Etiology: Wound Location: Left Forearm Wound Open Wounding Event: Trauma Status: Date Acquired: 11/13/2015 Comorbid Cataracts,  Glaucoma, Anemia, Weeks Of Treatment: 2 History: Arrhythmia, Type II Diabetes, Clustered Wound: No Osteoarthritis, Neuropathy Photos Photo Uploaded By: Rebecca Eaton, RN, Roslynn Amble on 12/01/2015 14:27:05 Wound Measurements Length: (cm) 0.5 Width: (cm) 0.1 Depth: (cm) 0.1 Area: (cm) 0.039 Volume: (cm) 0.004 % Reduction in Area: 98.4% % Reduction in Volume: 98.3% Epithelialization: Large (67-100%) Tunneling: No Undermining: No Wound Description Classification: Partial Thickness Wound Margin: Well defined, not attached Exudate Amount: Small Exudate Type: Serosanguineous Exudate Color: red, brown Wound Bed Granulation Amount: Large (67-100%) Exposed Structure Granulation Quality: Red Fascia Exposed: No Necrotic Amount: None Present (0%) Fat Layer Exposed: No Tendon Exposed: No Zambrana, Donold ([id number]) Muscle Exposed: No Joint Exposed: No Bone Exposed: No Limited to Skin Breakdown Periwound Skin Texture Texture Color No Abnormalities Noted: No No Abnormalities Noted: No Callus: No Atrophie Blanche: No Crepitus: No Cyanosis: No Excoriation: No Ecchymosis: No Fluctuance: No Erythema: No Friable: No Hemosiderin Staining: No Induration: No Mottled: No Localized Edema: No Pallor: No Rash: No Rubor: No Scarring: No Moisture No Abnormalities Noted: No Dry / Scaly: No Maceration: No Moist: No Wound Preparation Ulcer Cleansing: Rinsed/Irrigated with Saline Topical Anesthetic Applied: None Treatment Notes Wound #2 (Left Forearm) 1. Cleansed with: Clean wound with Normal Saline 4. Dressing Applied: Other dressing (specify in notes) 5. Secondary Dressing Applied Kerlix/Conform Notes siltet sorbact, keep dry Electronic Signature(s) Signed: 12/01/2015 2:46:25 PM By: Rebecca Eaton, RN, Sendra Entered By: Rebecca Eaton RN, Sendra on 12/01/2015 09:11:54 SAIQUAN, YUREK ([id number]) -------------------------------------------------------------------------------- Vitals  Details Patient Name: [name] Date of Service: 12/01/2015 8:45 AM Medical Record Number: [id number] Patient Account Number: [id number] Date of Birth/Sex: November 22, 1945 (70 y.o. Male) Treating RN: Macarthur Critchley Primary Care Physician: [name] Other Clinician: Referring Physician: [name] Treating Physician/Extender: Frann Rider in Treatment: 9 Vital Signs Time Taken: 08:53 Temperature (F): 98.3 Height (in): 68 Pulse (bpm): 72 Weight (lbs): 178 Blood Pressure (mmHg): 187/89 Body Mass Index (BMI): 27.1 Reference Range: 80 - 120 mg / dl Electronic Signature(s) Signed: 12/01/2015 2:46:25 PM By: Rebecca Eaton RN, Sendra Entered By: Rebecca Eaton RN, Sendra on 12/01/2015 08:53:34

## 2015-12-01 NOTE — Progress Notes (Signed)
CAS, MERGEL ([id number]) Visit Report for 12/01/2015 Chief Complaint Document Details Patient Name: [name], [name] Date of Service: 12/01/2015 8:45 AM Medical Record Number: [id number] Patient Account Number: [id number] Date of Birth/Sex: 1946-09-08 (70 y.o. Male) Treating RN: Macarthur Critchley Primary Care Physician: [name] Other Clinician: Referring Physician: [name] Treating Physician/Extender: Frann Rider in Treatment: 9 Information Obtained from: Patient Chief Complaint Patient presents to the wound care center for a consult due non healing wound to the left lower extremity for the last 2 months. Electronic Signature(s) Signed: 12/01/2015 9:04:15 AM By: Christin Fudge MD, FACS Entered By: Christin Fudge on 12/01/2015 09:04:15 [name], [name] ([id number]) -------------------------------------------------------------------------------- HPI Details Patient Name: [name] Date of Service: 12/01/2015 8:45 AM Medical Record Number: [id number] Patient Account Number: [id number] Date of Birth/Sex: 1946/09/20 (70 y.o. Male) Treating RN: Macarthur Critchley Primary Care Physician: [name] Other Clinician: Referring Physician: [name] Treating Physician/Extender: Frann Rider in Treatment: 9 History of Present Illness Location: left lower leg anteriorly on his shin Quality: Patient reports experiencing a dull pain to affected area(s). Severity: Patient states wound are getting worse. Duration: Patient has had the wound for > 2 months prior to seeking treatment at the wound center Timing: Pain in wound is Intermittent (comes and goes Context: The wound occurred when the patient had a blunt injury with a golf cart 2 months ago Modifying Factors: Other treatment(s) tried include: he has been applying local ointments to this and once the scab came out he noticed a deep ulceration. Associated Signs and Symptoms: Patient  reports having difficulty standing for long periods. HPI Description: 70 year old gentleman who was recently seen by his nephrologist [name], and noted to have a wound on his left lower extremity which was lacerated 2 months ago and now has reopened. The patient's left shin has a ulceration with some exudate but no evidence of infection and he was referred to [place] for further care as it was known that the patient has had some peripheral vascular disease in the past. Past medical history significant for chronic kidney disease, atrial fibrillation, diabetes mellitus,status post kidney transplant in 1983 and 2005, a week fistula graft placement, status post previous bowel surgery. 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 10/06/2015 -- x-ray of the left tibia and fibula shows no evidence of osteomyelitis. The patient has also had Doppler studies of his extremity and is awaiting the appointment with the vascular surgeon. We have not yet received these reports. 10/13/2015 -- lower extremity venous duplex reflux evaluation shows reflux in the left common femoral vein, left saphenofemoral junction and the proximal greater saphenous vein extending to the proximal calf. There is also reflux in the left proximal to mid small saphenous vein. Arterial duplex studies done showed the resting ABI was not applicable due to tibial artery medial calcification. 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 The right TBI was 1 which is normal and the left ABI was 1 which is normal. Patient has otherwise been doing fine and has been compliant with his dressings. 10/20/2015 -- He was seen by [name] recently for a vascular opinion on 10/15/2015. His left lower extremity venous insufficiency duplex study revealed GSV reflux,SS vein reflux and  deep venous reflux in the common femoral vein. His ABIs were non compressible and his TBI on the right  was 1.01 and on the left was 0.80. He was asked to continue with the wound care with compressive therapy followed by EVLA of the left GS vein 3 months. He recommended 20-30 mm thigh-high compression stockings and the need for a three- month trial of this. The patient had an Unna boot applied at the vascular office but he could not tolerate this with a lot of pain and issues with his toes and hence came here on Friday for removal of this and we reapplied a 2 layer Lough, Berthel ([id number]) compression. 11/10/2015 -- patient still has not purchased his 20-30 mm thigh-high compression stockings as prescribed by [name]. 11/17/2015 -- 3 days ago he injured his left forearm on a bedpost and has had a lacerated wound which she has been treating with Neosporin. Electronic Signature(s) Signed: 12/01/2015 9:04:21 AM By: Christin Fudge MD, FACS Entered By: Christin Fudge on 12/01/2015 09:04:21 [name], [name] ([id number]) -------------------------------------------------------------------------------- Physical Exam Details Patient Name: [name] Date of Service: 12/01/2015 8:45 AM Medical Record Number: [id number] Patient Account Number: [id number] Date of Birth/Sex: 1946/02/16 (69 y.o. Male) Treating RN: Macarthur Critchley Primary Care Physician: [name] Other Clinician: Referring Physician: [name] Treating Physician/Extender: Frann Rider in Treatment: 9 Constitutional . Pulse regular. Respirations normal and unlabored. Afebrile. . Eyes Nonicteric. Reactive to light. Ears, Nose, Mouth, and Throat Lips, teeth, and gums WNL.Marland Kitchen Moist mucosa without lesions. Neck supple and nontender. No palpable supraclavicular or cervical adenopathy. Normal sized without goiter. Respiratory WNL. No retractions.. Cardiovascular Pedal Pulses WNL. No clubbing, cyanosis  or edema. Lymphatic No adneopathy. No adenopathy. No adenopathy. Musculoskeletal Adexa without tenderness or enlargement.. Digits and nails w/o clubbing, cyanosis, infection, petechiae, ischemia, or inflammatory conditions.. Integumentary (Hair, Skin) No suspicious lesions. No crepitus or fluctuance. No peri-wound warmth or erythema. No masses.Marland Kitchen Psychiatric Judgement and insight Intact.. No evidence of depression, anxiety, or agitation.. Notes the wound on his left lower exudative is completely healed and he's had a wedge biopsy of the skin lesion on the medial upper calf. His left arm had a significant eschar and once this was gently removed there is a small area which is still open with clean granulation tissue and we will address this up appropriately. Electronic Signature(s) Signed: 12/01/2015 9:27:04 AM By: Christin Fudge MD, FACS Entered By: Christin Fudge on 12/01/2015 09:27:04 [name], [name] ([id number]) -------------------------------------------------------------------------------- Physician Orders Details Patient Name: [name] Date of Service: 12/01/2015 8:45 AM Medical Record Number: [id number] Patient Account Number: [id number] Date of Birth/Sex: [birth year] (70 y.o. Male) Treating RN: Macarthur Critchley Primary Care Physician: [name] Other Clinician: Referring Physician: [name] Treating Physician/Extender: Frann Rider in Treatment: 9 Verbal / Phone Orders: Yes Clinician: Macarthur Critchley Read Back and Verified: Yes Diagnosis Coding Wound Cleansing Wound #2 Left Forearm o May shower with protection. Primary Wound Dressing Wound #2 Left Forearm o Other: - siltec sorbact Dressing Change Frequency Wound #2 Left Forearm o Change dressing every week - dont change dressing unless it gets wet Follow-up Appointments Wound #2 Left Forearm o Return Appointment in 1 week. Edema Control o Support Garment 20-30 mm/Hg pressure  to: - continue to wear thigh high compression stockings Electronic Signature(s) Signed: 12/01/2015 2:46:25 PM By: Ardean Larsen Signed: 12/01/2015 4:11:43 PM By: Christin Fudge MD, FACS Entered By: Rebecca Eaton RN, Sendra on 12/01/2015 09:00:37 [name], [name] ([id number]) -------------------------------------------------------------------------------- Problem List Details Patient Name: [name] Date of Service: 12/01/2015 8:45 AM Medical Record Number: [id number] Patient Account Number: [id number] Date of Birth/Sex: December 21, 1945 (69 y.o.  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/29/2015 Yes exposed I73.9 Peripheral vascular disease, unspecified 09/29/2015 Yes I87.312 Chronic venous hypertension (idiopathic) with ulcer of left 09/29/2015 Yes lower extremity Z94.0 Kidney transplant status 09/29/2015 Yes S41.112A Laceration without foreign body of left upper arm, initial 11/17/2015 Yes encounter Inactive Problems Resolved Problems Electronic Signature(s) Signed: 12/01/2015 9:04:09 AM By: Christin Fudge MD, FACS Entered By: Christin Fudge on 12/01/2015 09:04:09 [name] ([id number]) -------------------------------------------------------------------------------- Progress Note Details Patient Name: [name] Date of Service: 12/01/2015 8:45 AM Medical Record Number: [id number] Patient Account Number: [id number] Date of Birth/Sex: [birth year] (70 y.o. Male) Treating RN: Macarthur Critchley Primary Care Physician: [name] Other Clinician: Referring Physician: [name] Treating  Physician/Extender: Frann Rider in Treatment: 9 Subjective Chief Complaint Information obtained from Patient Patient presents to the wound care center for a consult due non healing wound to the left lower extremity for the last 2 months. History of Present Illness (HPI) The following HPI elements were documented for the patient's wound: Location: left lower leg anteriorly on his shin Quality: Patient reports experiencing a dull pain to affected area(s). Severity: Patient states wound are getting worse. Duration: Patient has had the wound for > 2 months prior to seeking treatment at the wound center Timing: Pain in wound is Intermittent (comes and goes Context: The wound occurred when the patient had a blunt injury with a golf cart 2 months ago Modifying Factors: Other treatment(s) tried include: he has been applying local ointments to this and once the scab came out he noticed a deep ulceration. Associated Signs and Symptoms: Patient reports having difficulty standing for long periods. 70 year old gentleman who was recently seen by his nephrologist [name], and noted to have a wound on his left lower extremity which was lacerated 2 months ago and now has reopened. The patient's left shin has a ulceration with some exudate but no evidence of infection and he was referred to [place] for further care as it was known that the patient has had some peripheral vascular disease in the past. Past medical history significant for chronic kidney disease, atrial fibrillation, diabetes mellitus,status post kidney transplant in 1983 and 2005, a week fistula graft placement, status post previous bowel surgery. 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 10/06/2015 -- x-ray of the left tibia and fibula shows no evidence of osteomyelitis. The patient has also had Doppler studies of his extremity and is awaiting the appointment with the vascular surgeon. We  have not yet received these reports. 10/13/2015 -- lower extremity venous duplex reflux evaluation shows reflux in the left common femoral vein, left saphenofemoral junction and the proximal greater saphenous vein extending to the proximal calf. There is also reflux in the left proximal to mid small saphenous vein. Arterial duplex studies done showed the resting ABI was not applicable due to tibial artery medial calcification. 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 The right TBI was 1 which is normal and the left ABI was 1 which is normal. Patient has otherwise been doing fine and has been compliant  with his dressings. 10/20/2015 -- He was seen by [name] recently for a vascular opinion on 10/15/2015. His left lower extremity venous insufficiency duplex study revealed GSV reflux,SS vein reflux and deep venous reflux in [place], [name] the common femoral vein. His ABIs were non compressible and his TBI on the right was 1.01 and on the left was 0.80. He was asked to continue with the wound care with compressive therapy followed by EVLA of the left GS vein 3 months. He recommended 20-30 mm thigh-high compression stockings and the need for a three- month trial of this. The patient had an Unna boot applied at the vascular office but he could not tolerate this with a lot of pain and issues with his toes and hence came here on Friday for removal of this and we reapplied a 2 layer compression. 11/10/2015 -- patient still has not purchased his 20-30 mm thigh-high compression stockings as prescribed by [name]. 11/17/2015 -- 3 days ago he injured his left forearm on a bedpost and has had a lacerated wound which she has been treating with Neosporin. Objective Constitutional Pulse regular. Respirations normal and unlabored. Afebrile. Vitals Time Taken:  8:53 AM, Height: 68 in, Weight: 178 lbs, BMI: 27.1, Temperature: 98.3 F, Pulse: 72 bpm, Blood Pressure: 187/89 mmHg. Eyes Nonicteric. Reactive to light. Ears, Nose, Mouth, and Throat Lips, teeth, and gums WNL.Marland Kitchen Moist mucosa without lesions. Neck supple and nontender. No palpable supraclavicular or cervical adenopathy. Normal sized without goiter. Respiratory WNL. No retractions.. Cardiovascular Pedal Pulses WNL. No clubbing, cyanosis or edema. Lymphatic No adneopathy. No adenopathy. No adenopathy. Musculoskeletal Adexa without tenderness or enlargement.. Digits and nails w/o clubbing, cyanosis, infection, petechiae, ischemia, or inflammatory conditions.[name], [name] ([id number]) Psychiatric Judgement and insight Intact.. No evidence of depression, anxiety, or agitation.. General Notes: the wound on his left lower exudative is completely healed and he's had a wedge biopsy of the skin lesion on the medial upper calf. His left arm had a significant eschar and once this was gently removed there is a small area which is still open with clean granulation tissue and we will address this up appropriately. Integumentary (Hair, Skin) No suspicious lesions. No crepitus or fluctuance. No peri-wound warmth or erythema. No masses.. Wound #2 status is Open. Original cause of wound was Trauma. The wound is located on the Left Forearm. The wound measures 0.5cm length x 0.1cm width x 0.1cm depth; 0.039cm^2 area and 0.004cm^3 volume. The wound is limited to skin breakdown. There is no tunneling or undermining noted. There is a small amount of serosanguineous drainage noted. The wound margin is well defined and not attached to the wound base. There is large (67-100%) red granulation within the wound bed. There is no necrotic tissue within the wound bed. The periwound skin appearance did not exhibit: Callus, Crepitus, Excoriation, Fluctuance, Friable, Induration, Localized Edema, Rash, Scarring,  Dry/Scaly, Maceration, Moist, Atrophie Blanche, Cyanosis, Ecchymosis, Hemosiderin Staining, Mottled, Pallor, Rubor, Erythema. Assessment Active Problems ICD-10 E11.622 - Type 2 diabetes mellitus with other skin ulcer L97.222 - Non-pressure chronic ulcer of left calf with fat layer exposed I73.9 - Peripheral vascular disease, unspecified I87.312 - Chronic venous hypertension (idiopathic) with ulcer of left lower extremity Z94.0 - Kidney transplant status S41.112A - Laceration without foreign body of left upper arm, initial encounter Plan Wound Cleansing: Wound #2 Left Forearm: May shower with protection. Primary Wound Dressing: Wound #2 Left Forearm: Other: - siltec sorbact Dressing Change FrequencyCELSO, [name] ([id number]) Wound #2 Left Forearm:  Change dressing every week - dont change dressing unless it gets wet Follow-up Appointments: Wound #2 Left Forearm: Return Appointment in 1 week. Edema Control: Support Garment 20-30 mm/Hg pressure to: - continue to wear thigh high compression stockings For his left forearm I have recommended Siltec Sorbact to be applied and a Kerlix and Coban dressing to be placed over this. I have asked him not to change this dressing unless wet. He'll come back and see as next week. For his left lower extremity the wound is completely healed and hence he will use his compression stockings of the 20-30 mm variety. he will follow-up with his vascular surgeons. He also has a skin lesion on the medial upper calf which is suspicious for a BCC order SCC and I have recommended a dermatology excision for this and he has seen the dermatologist last week of wedge biopsy has been taken and further treatment will be decided by the dermatologist. Electronic Signature(s) Signed: 12/01/2015 9:28:07 AM By: Christin Fudge MD, FACS Entered By: Christin Fudge on 12/01/2015 09:28:07 [name]  ([id number]) -------------------------------------------------------------------------------- SuperBill Details Patient Name: [name] Date of Service: 12/01/2015 Medical Record Number: [id number] Patient Account Number: [id number] Date of Birth/Sex: 09-Sep-1946 (70 y.o. Male) Treating RN: Macarthur Critchley Primary Care Physician: [name] Other Clinician: Referring Physician: [name] Treating Physician/Extender: Frann Rider in Treatment: 9 Diagnosis Coding ICD-10 Codes Code Description E11.622 Type 2 diabetes mellitus with other skin 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er exposed I73.9 Peripheral vascular disease, unspecified I87.312 Chronic venous hypertension (idiopathic) with ulcer of left lower extremity Z94.0 Kidney transplant status S41.112A Laceration without foreign body of left upper arm, initial encounter Facility Procedures CPT4 Code: [id number] Description: 99213 - WOUND CARE VISIT-LEV 3 EST PT Modifier: Quantity: 1 Physician Procedures CPT4 Code Description: [id number] - WC PHYS LEVEL 3 - EST PT ICD-10 Description Diagnosis E11.622 Type 2 diabetes mellitus with other skin ulcer L97.222 Non-pressure chronic ulcer of left calf with fat la S41.112A Laceration without foreign body of  left upper arm, I73.9 Peripheral vascular disease, unspecified Modifier: yer exposed initial encount Quantity: 1 er Engineer, maintenance) Signed: 12/01/2015 2:46:25 PM By: Ardean Larsen Signed: 12/01/2015 4:11:43 PM By: Christin Fudge MD, FACS Previous Signature: 12/01/2015 9:28:25 AM Version By: Christin Fudge MD, FACS Entered By: Rebecca Eaton RN, Sendra on 12/01/2015 10:42:07

## 2015-12-04 DIAGNOSIS — D0472 Carcinoma in situ of skin of left lower limb, including hip: Secondary | ICD-10-CM | POA: Diagnosis not present

## 2015-12-08 ENCOUNTER — Encounter: Payer: Medicare Other | Admitting: Surgery

## 2015-12-08 DIAGNOSIS — E1122 Type 2 diabetes mellitus with diabetic chronic kidney disease: Secondary | ICD-10-CM | POA: Diagnosis not present

## 2015-12-08 DIAGNOSIS — E11622 Type 2 diabetes mellitus with other skin ulcer: Secondary | ICD-10-CM | POA: Diagnosis not present

## 2015-12-08 DIAGNOSIS — I87312 Chronic venous hypertension (idiopathic) with ulcer of left lower extremity: Secondary | ICD-10-CM | POA: Diagnosis not present

## 2015-12-08 DIAGNOSIS — S41112A Laceration without foreign body of left upper arm, initial encounter: Secondary | ICD-10-CM | POA: Diagnosis not present

## 2015-12-08 DIAGNOSIS — Z940 Kidney transplant status: Secondary | ICD-10-CM | POA: Diagnosis not present

## 2015-12-08 DIAGNOSIS — L97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er exposed: Secondary | ICD-10-CM | POA: Diagnosis not present

## 2015-12-08 DIAGNOSIS — L98491 Non-pressure chronic ulcer of skin of other sites limited to breakdown of skin: Secondary | ICD-10-CM | POA: Diagnosis not present

## 2015-12-08 DIAGNOSIS — I739 Peripheral vascular disease, unspecified: Secondary | ICD-10-CM | POA: Diagnosis not present

## 2015-12-09 NOTE — Progress Notes (Signed)
EIVIN, RACHOW ([id number]) Visit Report for 12/08/2015 Chief Complaint Document Details Patient Name: [name], [name] Date of Service: 12/08/2015 9:30 AM Medical Record Number: [id number] Patient Account Number: [id number] Date of Birth/Sex: 18-Sep-1946 (71 y.o. Male) Treating RN: Macarthur Critchley Primary Care Physician: [name] Other Clinician: Referring Physician: [name] Treating Physician/Extender: Frann Rider in Treatment: 10 Information Obtained from: Patient Chief Complaint Patient presents to the wound care center for a consult due non healing wound to the left lower extremity for the last 2 months. Electronic Signature(s) Signed: 12/08/2015 9:38:33 AM By: Christin Fudge MD, FACS Entered By: Christin Fudge on 12/08/2015 09:38:33 [name], [name] ([id number]) -------------------------------------------------------------------------------- HPI Details Patient Name: [name] Date of Service: 12/08/2015 9:30 AM Medical Record Number: [id number] Patient Account Number: [id number] Date of Birth/Sex: 11-Mar-1946 (70 y.o. Male) Treating RN: Macarthur Critchley Primary Care Physician: [name] Other Clinician: Referring Physician: [name] Treating Physician/Extender: Frann Rider in Treatment: 10 History of Present Illness Location: left lower leg anteriorly on his shin Quality: Patient reports experiencing a dull pain to affected area(s). Severity: Patient states wound are getting worse. Duration: Patient has had the wound for > 2 months prior to seeking treatment at the wound center Timing: Pain in wound is Intermittent (comes and goes Context: The wound occurred when the patient had a blunt injury with a golf cart 2 months ago Modifying Factors: Other treatment(s) tried include: he has been applying local ointments to this and once the scab came out he noticed a deep ulceration. Associated Signs and Symptoms: Patient  reports having difficulty standing for long periods. HPI Description: 70 year old gentleman who was recently seen by his nephrologist [name], and noted to have a wound on his left lower extremity which was lacerated 2 months ago and now has reopened. The patient's left shin has a ulceration with some exudate but no evidence of infection and he was referred to [place] for further care as it was known that the patient has had some peripheral vascular disease in the past. Past medical history significant for chronic kidney disease, atrial fibrillation, diabetes mellitus,status post kidney transplant in 1983 and 2005, a week fistula graft placement, status post previous bowel surgery. 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 10/06/2015 -- x-ray of the left tibia and fibula shows no evidence of osteomyelitis. The patient has also had Doppler studies of his extremity and is awaiting the appointment with the vascular surgeon. We have not yet received these reports. 10/13/2015 -- lower extremity venous duplex reflux evaluation shows reflux in the left common femoral vein, left saphenofemoral junction and the proximal greater saphenous vein extending to the proximal calf. There is also reflux in the left proximal to mid small saphenous vein. Arterial duplex studies done showed the resting ABI was not applicable due to tibial artery medial calcification. 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 The right TBI was 1 which is normal and the left ABI was 1 which is normal. Patient has otherwise been doing fine and has been compliant with his dressings. 10/20/2015 -- He was seen by [name] recently for a vascular opinion on 10/15/2015. His left lower extremity venous insufficiency duplex study revealed GSV reflux,SS vein reflux and  deep venous reflux in the common femoral vein. His ABIs were non compressible and his TBI on the right  was 1.01 and on the left was 0.80. He was asked to continue with the wound care with compressive therapy followed by EVLA of the left GS vein 3 months. He recommended 20-30 mm thigh-high compression stockings and the need for a three- month trial of this. The patient had an Unna boot applied at the vascular office but he could not tolerate this with a lot of pain and issues with his toes and hence came here on Friday for removal of this and we reapplied a 2 layer Boda, Gurshaan ([id number]) compression. 11/10/2015 -- patient still has not purchased his 20-30 mm thigh-high compression stockings as prescribed by [name]. 11/17/2015 -- 3 days ago he injured his left forearm on a bedpost and has had a lacerated wound which she has been treating with Neosporin. Electronic Signature(s) Signed: 12/08/2015 9:38:40 AM By: Christin Fudge MD, FACS Entered By: Christin Fudge on 12/08/2015 09:38:39 [name], [name] ([id number]) -------------------------------------------------------------------------------- Physical Exam Details Patient Name: [name] Date of Service: 12/08/2015 9:30 AM Medical Record Number: [id number] Patient Account Number: [id number] Date of Birth/Sex: 29-May-1946 (70 y.o. Male) Treating RN: Macarthur Critchley Primary Care Physician: [name] Other Clinician: Referring Physician: [name] Treating Physician/Extender: Frann Rider in Treatment: 10 Constitutional . Pulse regular. Respirations normal and unlabored. Afebrile. . Eyes Nonicteric. Reactive to light. Ears, Nose, Mouth, and Throat Lips, teeth, and gums WNL.Marland Kitchen Moist mucosa without lesions. Neck supple and nontender. No palpable supraclavicular or cervical adenopathy. Normal sized without goiter. Respiratory WNL. No retractions.. Cardiovascular Pedal Pulses WNL. No clubbing, cyanosis  or edema. Lymphatic No adneopathy. No adenopathy. No adenopathy. Musculoskeletal Adexa without tenderness or enlargement.. Digits and nails w/o clubbing, cyanosis, infection, petechiae, ischemia, or inflammatory conditions.. Integumentary (Hair, Skin) No suspicious lesions. No crepitus or fluctuance. No peri-wound warmth or erythema. No masses.Marland Kitchen Psychiatric Judgement and insight Intact.. No evidence of depression, anxiety, or agitation.. Notes the wound on the left lower eczema he is completely healed and there is no open ulceration. 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 Electronic Signature(s) Signed: 12/08/2015 9:39:20 AM By: Christin Fudge MD, FACS Entered By: Christin Fudge on 12/08/2015 09:39:20 [name], [name] ([id number]) -------------------------------------------------------------------------------- Physician Orders Details Patient Name: [name] Date of Service: 12/08/2015 9:30 AM Medical Record Number: [id number] Patient Account Number: [id number] Date of Birth/Sex: 1946-03-05 (70 y.o. Male) Treating RN: Macarthur Critchley Primary Care Physician: [name] Other Clinician: Referring Physician: [name] Treating Physician/Extender: Frann Rider in Treatment: 10 Verbal / Phone Orders: Yes Clinician: Macarthur Critchley Read Back and Verified: Yes Diagnosis Coding Edema Control o Support Garment 20-30 mm/Hg pressure to: - continue to wear thigh high stockings Discharge From [facility] Services o Discharge from [place] - follow up with clinic if needed Electronic Signature(s) Signed: 12/08/2015 4:16:25 PM By: Ardean Larsen Signed: 12/08/2015 4:23:25 PM By: Christin Fudge MD, FACS Entered By: Rebecca Eaton RN, Sendra on 12/08/2015 09:33:50 [name], [name]  ([id number]) -------------------------------------------------------------------------------- Problem List Details Patient Name: [name] Date of Service: 12/08/2015 9:30 AM Medical Record Number: [id number] Patient Account Number: [id number] Date of Birth/Sex: [birth year] (70 y.o. 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/29/2015 Yes exposed I73.9 Peripheral vascular disease, unspecified 09/29/2015 Yes I87.312 Chronic venous hypertension (idiopathic) with ulcer of left 09/29/2015 Yes lower extremity Z94.0 Kidney transplant status 09/29/2015 Yes S41.112A Laceration without foreign body of left upper arm, initial 11/17/2015 Yes encounter Inactive Problems Resolved Problems Electronic Signature(s) Signed: 12/08/2015 9:38:27 AM By: Christin Fudge MD, FACS Entered By: Christin Fudge on 12/08/2015 09:38:26 [name] ([id number]) -------------------------------------------------------------------------------- Progress Note Details Patient Name: [name] Date of Service: 12/08/2015 9:30 AM Medical Record Number: [id number] Patient Account Number: [id number] Date of Birth/Sex: [birth year] (70 y.o. Male) Treating RN: Macarthur Critchley Primary Care Physician: [name] Other Clinician: Referring Physician: [name] Treating Physician/Extender: Frann Rider in Treatment: 10 Subjective Chief Complaint Information obtained from Patient Patient presents to the wound care center for a consult due non healing wound to the left lower extremity for the last 2 months. History of Present Illness  (HPI) The following HPI elements were documented for the patient's wound: Location: left lower leg anteriorly on his shin Quality: Patient reports experiencing a dull pain to affected area(s). Severity: Patient states wound are getting worse. Duration: Patient has had the wound for > 2 months prior to seeking treatment at the wound center Timing: Pain in wound is Intermittent (comes and goes Context: The wound occurred when the patient had a blunt injury with a golf cart 2 months ago Modifying Factors: Other treatment(s) tried include: he has been applying local ointments to this and once the scab came out he noticed a deep ulceration. Associated Signs and Symptoms: Patient reports having difficulty standing for long periods. 70 year old gentleman who was recently seen by his nephrologist [name], and noted to have a wound on his left lower extremity which was lacerated 2 months ago and now has reopened. The patient's left shin has a ulceration with some exudate but no evidence of infection and he was referred to [place] for further care as it was known that the patient has had some peripheral vascular disease in the past. Past medical history significant for chronic kidney disease, atrial fibrillation, diabetes mellitus,status post kidney transplant in 1983 and 2005, a week fistula graft placement, status post previous bowel surgery. 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 10/06/2015 -- x-ray of the left tibia and fibula shows no evidence of osteomyelitis. The patient has also had Doppler studies of his extremity and is awaiting the appointment with the vascular surgeon. We have not yet received these reports. 10/13/2015 -- lower extremity venous duplex reflux evaluation shows reflux in the left common femoral vein, left saphenofemoral junction and the proximal greater saphenous vein extending to the proximal calf. There is also reflux in the left  proximal to mid small saphenous vein. Arterial duplex studies done showed the resting ABI was not applicable due to tibial artery medial calcification. 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 The right TBI was 1 which is normal and the left ABI was 1 which is normal. Patient has otherwise been doing fine and has been compliant with his dressings. 10/20/2015 -- He was seen by [name] recently for a vascular opinion on 10/15/2015. His left lower extremity venous insufficiency duplex study revealed GSV reflux,SS vein reflux and deep venous reflux in [name], [name] ([id number]) the common femoral vein. His ABIs were  non compressible and his TBI on the right was 1.01 and on the left was 0.80. He was asked to continue with the wound care with compressive therapy followed by EVLA of the left GS vein 3 months. He recommended 20-30 mm thigh-high compression stockings and the need for a three- month trial of this. The patient had an Unna boot applied at the vascular office but he could not tolerate this with a lot of pain and issues with his toes and hence came here on Friday for removal of this and we reapplied a 2 layer compression. 11/10/2015 -- patient still has not purchased his 20-30 mm thigh-high compression stockings as prescribed by [name]. 11/17/2015 -- 3 days ago he injured his left forearm on a bedpost and has had a lacerated wound which she has been treating with Neosporin. Objective Constitutional Pulse regular. Respirations normal and unlabored. Afebrile. Vitals Time Taken: 9:28 AM, Height: 68 in, Weight: 178 lbs, BMI: 27.1, Pulse: 79 bpm, Blood Pressure: 175/75 mmHg. Eyes Nonicteric. Reactive to light. Ears, Nose, Mouth, and Throat Lips, teeth, and gums WNL.Marland Kitchen Moist mucosa without lesions. Neck supple and nontender. No palpable supraclavicular or  cervical adenopathy. Normal sized without goiter. Respiratory WNL. No retractions.. Cardiovascular Pedal Pulses WNL. No clubbing, cyanosis or edema. Lymphatic No adneopathy. No adenopathy. No adenopathy. Musculoskeletal Adexa without tenderness or enlargement.. Digits and nails w/o clubbing, cyanosis, infection, petechiae, ischemia, or inflammatory conditions.DEVVON, [name] ([id number]) Psychiatric Judgement and insight Intact.. No evidence of depression, anxiety, or agitation.. General Notes: the wound on the left lower eczema he is completely healed and there is no open ulceration. 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 Integumentary (Hair, Skin) No suspicious lesions. No crepitus or fluctuance. No peri-wound warmth or erythema. No masses.. Wound #2 status is Open. Original cause of wound was Trauma. The wound is located on the Left Forearm. The wound measures 0cm length x 0cm width x 0cm depth; 0cm^2 area and 0cm^3 volume. The wound is limited to skin breakdown. There is no tunneling or undermining noted. There is a small amount of serosanguineous drainage noted. The wound margin is well defined and not attached to the wound base. There is large (67-100%) red granulation within the wound bed. There is no necrotic tissue within the wound bed. The periwound skin appearance did not exhibit: Callus, Crepitus, Excoriation, Fluctuance, Friable, Induration, Localized Edema, Rash, Scarring, Dry/Scaly, Maceration, Moist, Atrophie Blanche, Cyanosis, Ecchymosis, Hemosiderin Staining, Mottled, Pallor, Rubor, Erythema. Assessment Active Problems ICD-10 E11.622 - Type 2 diabetes mellitus with other skin ulcer L97.222 - Non-pressure chronic ulcer of left calf with fat layer exposed I73.9 - Peripheral vascular disease, unspecified I87.312 - Chronic  venous hypertension (idiopathic) with ulcer of left lower extremity Z94.0 - Kidney transplant status S41.112A - Laceration without foreign body of left upper arm, initial encounter the patient's left forearm is completely healed. As far as his venous ulceration goes he will continue to wear compression stockings daily and we've discussed details of this. He will also follow-up with [name] regarding the endovenous laser ablation. He is discharged from the wound care services and will be seen back as needed. Plan [name], [name] ([id number]) Edema Control: Support Garment 20-30 mm/Hg pressure to: - continue to wear thigh high stockings Discharge From [facility] Services: Discharge from [place] - follow up with clinic if needed  the patient's left forearm is completely healed. As far as his venous ulceration goes he will continue to wear compression stockings daily and we've discussed details of this. He will also follow-up with [name] regarding the endovenous laser ablation. He is discharged from the wound care services and will be seen back as needed. Electronic Signature(s) Signed: 12/08/2015 9:40:09 AM By: Christin Fudge MD, FACS Entered By: Christin Fudge on 12/08/2015 09:40:09 [name], [name] ([id number]) -------------------------------------------------------------------------------- SuperBill Details Patient Name: [name] Date of Service: 12/08/2015 Medical Record Number: [id number] Patient Account Number: [id number] Date of Birth/Sex: 09-20-1946 (70 y.o. Male) Treating RN: Macarthur Critchley Primary Care Physician: [name] Other Clinician: Referring Physician: [name] Treating Physician/Extender: Frann Rider in Treatment: 10 Diagnosis Coding ICD-10 Codes Code Description E11.622 Type 2 diabetes mellitus with other skin 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er exposed I73.9 Peripheral vascular disease,  unspecified I87.312 Chronic venous hypertension (idiopathic) with ulcer of left lower extremity Z94.0 Kidney transplant status S41.112A Laceration without foreign body of left upper arm, initial encounter Facility Procedures CPT4 Code: [id number] Description: [id number] - WOUND CARE VISIT-LEV 2 EST PT Modifier: Quantity: 1 Physician Procedures CPT4 Code Description: [id number] - WC PHYS LEVEL 2 - EST PT ICD-10 Description Diagnosis E11.622 Type 2 diabetes mellitus with other skin ulcer L97.222 Non-pressure chronic ulcer of left calf with fat la I73.9 Peripheral vascular disease, unspecified  S41.112A Laceration without foreign body of left upper arm, Modifier: yer exposed initial encount Quantity: 1 er Engineer, maintenance) Signed: 12/08/2015 9:40:59 AM By: Christin Fudge MD, FACS Entered By: Christin Fudge on 12/08/2015 09:40:59

## 2015-12-09 NOTE — Progress Notes (Signed)
SAAD, BRANSFIELD ([id number]) Visit Report for 12/08/2015 Arrival Information Details Patient Name: [name], [name] Date of Service: 12/08/2015 9:30 AM Medical Record Number: [id number] Patient Account Number: [id number] Date of Birth/Sex: [birth year] (70 y.o. Male) Treating RN: Macarthur Critchley Primary Care Physician: [name] Other Clinician: Referring Physician: [name] Treating Physician/Extender: Frann Rider in Treatment: 10 Visit Information History Since Last Visit All ordered tests and consults were completed: No Patient Arrived: Ambulatory Added or deleted any medications: No Arrival Time: 09:24 Any new allergies or adverse reactions: No Accompanied By: self Had a fall or experienced change in No Transfer Assistance: None activities of daily living that may affect Patient Identification Verified: Yes risk of falls: Secondary Verification Process Yes Signs or symptoms of abuse/neglect since last No Completed: visito Patient Requires Transmission-Based No Hospitalized since last visit: No Precautions: Has Dressing in Place as Prescribed: Yes Patient Has Alerts: Yes Pain Present Now: No Electronic Signature(s) Signed: 12/08/2015 4:16:25 PM By: Rebecca Eaton, RN, Sendra Entered By: Rebecca Eaton RN, Sendra on 12/08/2015 09:25:04 [name] ([id number]) -------------------------------------------------------------------------------- Clinic Level of Care Assessment Details Patient Name: [name] Date of Service: 12/08/2015 9:30 AM Medical Record Number: [id number] Patient Account Number: [id number] Date of Birth/Sex: [birth year] (70 y.o. Male) Treating RN: Macarthur Critchley Primary Care Physician: [name] Other Clinician: Referring Physician: [name] Treating Physician/Extender: Frann Rider in Treatment: 10 Clinic Level of Care Assessment Items TOOL 4 Quantity Score X - Use when only an EandM is performed on  FOLLOW-UP visit 1 0 ASSESSMENTS - Nursing Assessment / Reassessment X - Reassessment of Co-morbidities (includes updates in patient status) 1 10 X - Reassessment of Adherence to Treatment Plan 1 5 ASSESSMENTS - Wound and Skin Assessment / Reassessment X - Simple Wound Assessment / Reassessment - one wound 1 5 []  - Complex Wound Assessment / Reassessment - multiple wounds 0 []  - Dermatologic / Skin Assessment (not related to wound area) 0 ASSESSMENTS - Focused Assessment []  - Circumferential Edema Measurements - multi extremities 0 []  - Nutritional Assessment / Counseling / Intervention 0 []  - Lower Extremity Assessment (monofilament, tuning fork, pulses) 0 []  - Peripheral Arterial Disease Assessment (using hand held doppler) 0 ASSESSMENTS - Ostomy and/or Continence Assessment and Care []  - Incontinence Assessment and Management 0 []  - Ostomy Care Assessment and Management (repouching, etc.) 0 PROCESS - Coordination of Care X - Simple Patient / Family Education for ongoing care 1 15 []  - Complex (extensive) Patient / Family Education for ongoing care 0 X - Staff obtains Programmer, systems, Records, Test Results / Process Orders 1 10 []  - Staff telephones HHA, Nursing Homes / Clarify orders / etc 0 []  - Routine Transfer to another Facility (non-emergent condition) 0 [name], [name] ([id number]) []  - Routine Hospital Admission (non-emergent condition) 0 []  - New Admissions / Biomedical engineer / Ordering NPWT, Apligraf, etc. 0 []  - Emergency Hospital Admission (emergent condition) 0 X - Simple Discharge Coordination 1 10 []  - Complex (extensive) Discharge Coordination 0 PROCESS - Special Needs []  - Pediatric / Minor Patient Management 0 []  - Isolation Patient Management 0 []  - Hearing / Language / Visual special needs 0 []  - Assessment of Community assistance (transportation, D/C planning, etc.) 0 []  - Additional assistance / Altered mentation 0 []  - Support Surface(s) Assessment (bed,  cushion, seat, etc.) 0 INTERVENTIONS - Wound Cleansing / Measurement X - Simple Wound Cleansing - one wound 1 5 []  - Complex Wound Cleansing - multiple wounds 0 X - Wound Imaging (photographs - any number  of wounds) 1 5 []  - Wound Tracing (instead of photographs) 0 X - Simple Wound Measurement - one wound 1 5 []  - Complex Wound Measurement - multiple wounds 0 INTERVENTIONS - Wound Dressings []  - Small Wound Dressing one or multiple wounds 0 []  - Medium Wound Dressing one or multiple wounds 0 []  - Large Wound Dressing one or multiple wounds 0 []  - Application of Medications - topical 0 []  - Application of Medications - injection 0 INTERVENTIONS - Miscellaneous []  - External ear exam 0 [name], [name] ([id number]) []  - Specimen Collection (cultures, biopsies, blood, body fluids, etc.) 0 []  - Specimen(s) / Culture(s) sent or taken to Lab for analysis 0 []  - Patient Transfer (multiple staff / Harrel Lemon Lift / Similar devices) 0 []  - Simple Staple / Suture removal (25 or less) 0 []  - Complex Staple / Suture removal (26 or more) 0 []  - Hypo / Hyperglycemic Management (close monitor of Blood Glucose) 0 []  - Ankle / Brachial Index (ABI) - do not check if billed separately 0 X - Vital Signs 1 5 Has the patient been seen at the hospital within the last three years: Yes Total Score: 75 Level Of Care: New/Established - Level 2 Electronic Signature(s) Signed: 12/08/2015 4:16:25 PM By: Rebecca Eaton, RN, Sendra Entered By: Rebecca Eaton RN, Sendra on 12/08/2015 09:36:38 [name] ([id number]) -------------------------------------------------------------------------------- Encounter Discharge Information Details Patient Name: [name] Date of Service: 12/08/2015 9:30 AM Medical Record Number: [id number] Patient Account Number: [id number] Date of Birth/Sex: 1946/02/19 (70 y.o. Male) Treating RN: Macarthur Critchley Primary Care Physician: [name] Other Clinician: Referring Physician:  [name] Treating Physician/Extender: Frann Rider in Treatment: 10 Encounter Discharge Information Items Discharge Pain Level: 0 Discharge Condition: Stable Ambulatory Status: Ambulatory Discharge Destination: Home Transportation: Private Auto Accompanied By: self Schedule Follow-up Appointment: No Medication Reconciliation completed and provided to Patient/Care Yes Reef Achterberg: Provided on Clinical Summary of Care: 12/08/2015 Form Type Recipient Paper Patient AR Notes pt discharged Electronic Signature(s) Signed: 12/08/2015 9:39:54 AM By: Ruthine Dose Entered By: Ruthine Dose on 12/08/2015 09:39:53 [name] ([id number]) -------------------------------------------------------------------------------- Lower Extremity Assessment Details Patient Name: [name] Date of Service: 12/08/2015 9:30 AM Medical Record Number: [id number] Patient Account Number: [id number] Date of Birth/Sex: [birth year] (70 y.o. Male) Treating RN: Macarthur Critchley Primary Care Physician: [name] Other Clinician: Referring Physician: [name] Treating Physician/Extender: Frann Rider in Treatment: 10 Electronic Signature(s) Signed: 12/08/2015 4:16:25 PM By: Rebecca Eaton, RN, Sendra Entered By: Rebecca Eaton RN, Sendra on 12/08/2015 09:28:50 [name], [name] ([id number]) -------------------------------------------------------------------------------- Pain Assessment Details Patient Name: [name] Date of Service: 12/08/2015 9:30 AM Medical Record Number: [id number] Patient Account Number: [id number] Date of Birth/Sex: Nov 22, 1945 (70 y.o. Male) Treating RN: Macarthur Critchley Primary Care Physician: [name] Other Clinician: Referring Physician: [name] Treating Physician/Extender: Frann Rider in Treatment: 10 Active Problems Location of Pain Severity and Description of Pain Patient Has Paino No Site Locations Rate  the pain. Current Pain Level: 0 Pain Management and Medication Current Pain Management: Electronic Signature(s) Signed: 12/08/2015 4:16:25 PM By: Rebecca Eaton, RN, Sendra Entered By: Rebecca Eaton RN, Sendra on 12/08/2015 09:25:12 [name], [name] ([id number]) -------------------------------------------------------------------------------- Patient/Caregiver Education Details Patient Name: [name] Date of Service: 12/08/2015 9:30 AM Medical Record Number: [id number] Patient Account Number: [id number] Date of Birth/Gender: 10-Sep-1946 (70 y.o. Male) Treating RN: Macarthur Critchley Primary Care Physician: [name] Other Clinician: Referring Physician: [name] Treating Physician/Extender: Frann Rider in Treatment: 10 Education Assessment Education Provided To: Patient Education Topics Provided Venous: Handouts: Controlling Swelling with Compression  Stockings Methods: Explain/Verbal Responses: State content correctly Wound/Skin Impairment: Handouts: Caring for Your Ulcer, Skin Care Do's and Dont's Methods: Explain/Verbal Responses: State content correctly Electronic Signature(s) Signed: 12/08/2015 4:16:25 PM By: Rebecca Eaton, RN, Sendra Entered By: Rebecca Eaton, RN, Sendra on 12/08/2015 09:31:14 [name] ([id number]) -------------------------------------------------------------------------------- Wound Assessment Details Patient Name: [name] Date of Service: 12/08/2015 9:30 AM Medical Record Number: [id number] Patient Account Number: [id number] Date of Birth/Sex: 11-07-1945 (70 y.o. Male) Treating RN: Macarthur Critchley Primary Care Physician: [name] Other Clinician: Referring Physician: [name] Treating Physician/Extender: Frann Rider in Treatment: 10 Wound Status Wound Number: 2 Primary Skin Tear Etiology: Wound Location: Left Forearm Wound Open Wounding Event: Trauma Status: Date Acquired:  11/13/2015 Comorbid Cataracts, Glaucoma, Anemia, Weeks Of Treatment: 3 History: Arrhythmia, Type II Diabetes, Clustered Wound: No Osteoarthritis, Neuropathy Photos Photo Uploaded By: Rebecca Eaton, RN, Roslynn Amble on 12/08/2015 13:51:02 Wound Measurements Length: (cm) 0 % Reduction i Width: (cm) 0 % Reduction i Depth: (cm) 0 Epithelializa Area: (cm) 0 Tunneling: Volume: (cm) 0 Undermining: n Area: 100% n Volume: 100% tion: Large (67-100%) No No Wound Description Classification: Partial Thickness Wound Margin: Well defined, not attached Exudate Amount: Small Exudate Type: Serosanguineous Exudate Color: red, brown Wound Bed Granulation Amount: Large (67-100%) Exposed Structure Granulation Quality: Red Fascia Exposed: No Necrotic Amount: None Present (0%) Fat Layer Exposed: No Tendon Exposed: No [name], [name] ([id number]) Muscle Exposed: No Joint Exposed: No Bone Exposed: No Limited to Skin Breakdown Periwound Skin Texture Texture Color No Abnormalities Noted: No No Abnormalities Noted: No Callus: No Atrophie Blanche: No Crepitus: No Cyanosis: No Excoriation: No Ecchymosis: No Fluctuance: No Erythema: No Friable: No Hemosiderin Staining: No Induration: No Mottled: No Localized Edema: No Pallor: No Rash: No Rubor: No Scarring: No Moisture No Abnormalities Noted: No Dry / Scaly: No Maceration: No Moist: No Wound Preparation Ulcer Cleansing: Rinsed/Irrigated with Saline Topical Anesthetic Applied: None Electronic Signature(s) Signed: 12/08/2015 4:16:25 PM By: Rebecca Eaton, RN, Sendra Entered By: Rebecca Eaton RN, Sendra on 12/08/2015 09:28:43 [name] ([id number]) -------------------------------------------------------------------------------- Vitals Details Patient Name: [name] Date of Service: 12/08/2015 9:30 AM Medical Record Number: [id number] Patient Account Number: [id number] Date of Birth/Sex: 1946-07-14 (70 y.o. Male) Treating RN:  Macarthur Critchley Primary Care Physician: [name] Other Clinician: Referring Physician: [name] Treating Physician/Extender: Frann Rider in Treatment: 10 Vital Signs Time Taken: 09:28 Pulse (bpm): 79 Height (in): 68 Blood Pressure (mmHg): 175/75 Weight (lbs): 178 Reference Range: 80 - 120 mg / dl Body Mass Index (BMI): 27.1 Electronic Signature(s) Signed: 12/08/2015 4:16:25 PM By: Rebecca Eaton RN, Sendra Entered By: Rebecca Eaton RN, Sendra on 12/08/2015 09:29:13

## 2015-12-10 DIAGNOSIS — Z940 Kidney transplant status: Secondary | ICD-10-CM | POA: Diagnosis not present

## 2015-12-10 DIAGNOSIS — D509 Iron deficiency anemia, unspecified: Secondary | ICD-10-CM | POA: Diagnosis not present

## 2015-12-10 DIAGNOSIS — E1129 Type 2 diabetes mellitus with other diabetic kidney complication: Secondary | ICD-10-CM | POA: Diagnosis not present

## 2015-12-10 DIAGNOSIS — N2581 Secondary hyperparathyroidism of renal origin: Secondary | ICD-10-CM | POA: Diagnosis not present

## 2015-12-11 ENCOUNTER — Ambulatory Visit
Admission: RE | Admit: 2015-12-11 | Discharge: 2015-12-11 | Disposition: A | Discharge status: Home / Self Care | Payer: Medicare Other | Source: Ambulatory Visit | Attending: Nephrology | Admitting: Nephrology

## 2015-12-11 ENCOUNTER — Other Ambulatory Visit: Payer: Self-pay | Admitting: Nephrology

## 2015-12-11 DIAGNOSIS — E118 Type 2 diabetes mellitus with unspecified complications: Secondary | ICD-10-CM | POA: Diagnosis not present

## 2015-12-11 DIAGNOSIS — R0601 Orthopnea: Secondary | ICD-10-CM

## 2015-12-11 DIAGNOSIS — N2581 Secondary hyperparathyroidism of renal origin: Secondary | ICD-10-CM | POA: Diagnosis not present

## 2015-12-11 DIAGNOSIS — I739 Peripheral vascular disease, unspecified: Secondary | ICD-10-CM | POA: Diagnosis not present

## 2015-12-11 DIAGNOSIS — E785 Hyperlipidemia, unspecified: Secondary | ICD-10-CM | POA: Diagnosis not present

## 2015-12-11 DIAGNOSIS — R0602 Shortness of breath: Secondary | ICD-10-CM | POA: Diagnosis not present

## 2015-12-11 DIAGNOSIS — E1129 Type 2 diabetes mellitus with other diabetic kidney complication: Secondary | ICD-10-CM | POA: Diagnosis not present

## 2015-12-11 DIAGNOSIS — I129 Hypertensive chronic kidney disease with stage 1 through stage 4 chronic kidney disease, or unspecified chronic kidney disease: Secondary | ICD-10-CM | POA: Diagnosis not present

## 2015-12-11 DIAGNOSIS — Z940 Kidney transplant status: Secondary | ICD-10-CM | POA: Diagnosis not present

## 2015-12-11 DIAGNOSIS — D509 Iron deficiency anemia, unspecified: Secondary | ICD-10-CM | POA: Diagnosis not present

## 2015-12-11 DIAGNOSIS — N186 End stage renal disease: Secondary | ICD-10-CM | POA: Diagnosis not present

## 2015-12-11 DIAGNOSIS — Z79899 Other long term (current) drug therapy: Secondary | ICD-10-CM | POA: Diagnosis not present

## 2015-12-25 DIAGNOSIS — I129 Hypertensive chronic kidney disease with stage 1 through stage 4 chronic kidney disease, or unspecified chronic kidney disease: Secondary | ICD-10-CM | POA: Diagnosis not present

## 2016-01-13 ENCOUNTER — Encounter: Payer: Self-pay | Admitting: Vascular Surgery

## 2016-01-13 ENCOUNTER — Ambulatory Visit: Payer: Medicare Other | Admitting: Vascular Surgery

## 2016-01-19 ENCOUNTER — Encounter: Payer: Self-pay | Admitting: Vascular Surgery

## 2016-01-19 ENCOUNTER — Ambulatory Visit (INDEPENDENT_AMBULATORY_CARE_PROVIDER_SITE_OTHER): Payer: Medicare Other | Admitting: Vascular Surgery

## 2016-01-19 VITALS — BP 169/86 | HR 66 | Temp 97.9°F | Resp 18 | Ht 68.0 in | Wt 182.0 lb

## 2016-01-19 DIAGNOSIS — I83892 Varicose veins of left lower extremities with other complications: Secondary | ICD-10-CM | POA: Diagnosis not present

## 2016-01-19 NOTE — Progress Notes (Signed)
Filed Vitals:   01/19/16 1419 01/19/16 1422  BP: 167/79 169/86  Pulse: 55 66  Temp: 97.9 F (36.6 C)   Resp: 18   Height: 5\' 8"  (1.727 m)   Weight: 182 lb (82.555 kg)   SpO2: 99%

## 2016-01-19 NOTE — Progress Notes (Signed)
Subjective:     Patient ID: [name], male   DOB: 1946-09-17, 70 y.o.   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 Patient was seen by Dr. Geryl Councilman 3 months ago who prescribed long leg elastic compression stockings because of gross reflux in the left great saphenous vein with a traumatic ulcer in the left leg which was slow to heal. He has normal arterial studies. Ulcer eventually did heal. The patient has noticed darkening of the skin the lower half of the left leg as well as some chronic swelling in the left ankle and painful varicosities. He is tried long-leg elastic compression stockings, elevation, and ibuprofen with no improvement in the symptoms. He has no history of DVT.  Past Medical History  Diagnosis Date  . Chronic kidney disease   . Neuropathy ([place])   . Atrial fibrillation ([place])   . Hyperlipidemia   . Diabetes mellitus     Social History  Substance Use Topics  . Smoking status: Never Smoker   . Smokeless tobacco: Never Used  . Alcohol Use: No    Family History  Problem Relation Age of Onset  . Heart disease Mother   . Heart attack Father   . Stroke Sister     Allergies  Allergen Reactions  . Elavil [Amitriptyline Hcl]     Unknown felt as if he was tripping     Current outpatient prescriptions:  .  Alcohol Swabs (B-D SINGLE USE SWABS REGULAR) PADS, , Disp: , Rfl:  .  aspirin 81 MG tablet, Take 81 mg by mouth daily.  , Disp: , Rfl:  .  BAYER CONTOUR NEXT TEST test strip, , Disp: , Rfl:  .  BD PEN NEEDLE NANO U/F 32G X 4 MM MISC, , Disp: , Rfl:  .  calcitRIOL (ROCALTROL) 0.25 MCG capsule, , Disp: , Rfl:  .  calcium carbonate 200 MG capsule, Take 250 mg by mouth every Monday, Wednesday, and Friday. 3 times a week, Disp: , Rfl:  .  CRESTOR 5 MG tablet, Take 5 mg by mouth daily., Disp: , Rfl:  .  diphenhydrAMINE (BENADRYL) 25 mg capsule, Take 25 mg  by mouth at bedtime as needed (for sleep/allergies.). , Disp: , Rfl:  .  furosemide (LASIX) 40 MG tablet, Take 40 mg by mouth daily as needed. Fluid., Disp: , Rfl:  .  gabapentin (NEURONTIN) 100 MG capsule, Take 100 mg by mouth 3 (three) times daily as needed (for nerve pain). Take three times/day as needed, Disp: , Rfl:  .  glipiZIDE (GLUCOTROL) 10 MG tablet, Take 10 mg by mouth 2 (two) times daily before a meal.  , Disp: , Rfl:  .  LANTUS SOLOSTAR 100 UNIT/ML Solostar Pen, Inject 9 Units into the skin every morning., Disp: , Rfl:  .  linagliptin (TRADJENTA) 5 MG TABS tablet, Take 5 mg by mouth daily.  , Disp: , Rfl:  .  MICROLET LANCETS MISC, , Disp: , Rfl:  .  mycophenolate (CELLCEPT) 500 MG tablet, Take 500 mg by mouth 2 (two) times daily. , Disp: , Rfl:  .  omega-3 acid ethyl esters (LOVAZA) 1 G capsule, Take 4 g by mouth daily. , Disp: , Rfl:  .  prednisoLONE 5 MG TABS, Take 5 mg by mouth daily.  , Disp: , Rfl:  .  predniSONE (DELTASONE) 5 MG tablet, , Disp: , Rfl:  .  SANTYL ointment, ,  Disp: , Rfl:  .  tacrolimus (PROGRAF) 1 MG capsule, Take 2 mg by mouth 2 (two) times daily. , Disp: , Rfl:  .  sulfamethoxazole-trimethoprim (BACTRIM DS,SEPTRA DS) 800-160 MG tablet, Reported on 01/19/2016, Disp: , Rfl:   Filed Vitals:   01/19/16 1419 01/19/16 1422  BP: 167/79 169/86  Pulse: 55 66  Temp: 97.9 F (36.6 C)   Resp: 18   Height: 5\' 8"  (1.727 m)   Weight: 182 lb (82.555 kg)   SpO2: 99%     Body mass index is 27.68 kg/(m^2).           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 An eyes chest pain, dyspnea on exertion, PND, orthopnea, hemoptysis     Objective:   Physical Exam BP 169/86 mmHg  Pulse 66  Temp(Src) 97.9 F (36.6 C)  Resp 18  Ht 5\' 8"  (1.727 m)  Wt 182 lb (82.555 kg)  BMI 27.68 kg/m2  SpO2 99%   Gen. Well-developed well-nourished male no apparent distress alert and oriented 3 Lungs no rhonchi or wheezing   left leg with hyperpigmentation lower half beginning in mid calf. 1+ edema. 3+ dorsalis pedis pulse palpable. Varicosities medial calf below the knee and great saphenous system. Ulcer lateral aspect of leg has now healed.    I reviewed the previous formal venous reflux study of the left leg and also performed an independent bedside sono site exam today which reveals gross reflux in the left great saphenous vein which is large caliber down to the proximal calf and is supplying the painful varicosities.     Assessment:      history of slowly healing venous ulcer left leg with chronic edema and skin changes due to gross reflux left great saphenous vein     Plan:      patient needs laser ablation left great saphenous vein to stabilize skin changes and relieve edema and pain Also hopefully this will make it less likely he will have further ulceration in the future We will proceed with precertification to perform this in the near future area he has tried 3 months of  Long-leg elastic compression stockings 20-30 millimeter gradient as well as elevation and ibuprofen.

## 2016-01-20 ENCOUNTER — Other Ambulatory Visit: Payer: Self-pay | Admitting: *Deleted

## 2016-01-20 ENCOUNTER — Ambulatory Visit: Payer: Medicare Other | Admitting: Vascular Surgery

## 2016-01-20 ENCOUNTER — Telehealth: Payer: Self-pay | Admitting: Vascular Surgery

## 2016-01-20 DIAGNOSIS — I83812 Varicose veins of left lower extremities with pain: Secondary | ICD-10-CM

## 2016-01-20 NOTE — Telephone Encounter (Signed)
Rolla Flatten, RN  P [facility]            Sal Sweeten needs a one wk fu lab and JDL on 5/22. Jul 16, 2047. Thx

## 2016-01-20 NOTE — Telephone Encounter (Signed)
sched appt for 02/09/16, lab at 1 and MD as 2:30.

## 2016-01-23 ENCOUNTER — Encounter: Payer: Self-pay | Admitting: Vascular Surgery

## 2016-02-02 ENCOUNTER — Encounter: Payer: Self-pay | Admitting: Vascular Surgery

## 2016-02-02 ENCOUNTER — Ambulatory Visit (INDEPENDENT_AMBULATORY_CARE_PROVIDER_SITE_OTHER): Payer: Medicare Other | Admitting: Vascular Surgery

## 2016-02-02 VITALS — BP 168/86 | HR 59 | Temp 97.6°F | Resp 18 | Ht 68.0 in | Wt 173.0 lb

## 2016-02-02 DIAGNOSIS — I83892 Varicose veins of left lower extremities with other complications: Secondary | ICD-10-CM | POA: Diagnosis not present

## 2016-02-02 NOTE — Progress Notes (Signed)
Subjective:     Patient ID: [name], male   DOB: 09-May-1946, 70 y.o.   MRN: [id number]  HPI this 70 year old male had laser ablation left great saphenous vein from the proximal calf to near the saphenofemoral junction performed under local tumescent anesthesia. A total of 2245 J of energy was utilized. He tolerated the procedure well.   Review of Systems     Objective:   Physical Exam BP 168/86 mmHg  Pulse 59  Temp(Src) 97.6 F (36.4 C)  Resp 18  Ht 5\' 8"  (1.727 m)  Wt 173 lb (78.472 kg)  BMI 26.31 kg/m2  SpO2 99%       Assessment:     Well-tolerated laser ablation left great saphenous vein performed under local tumescent anesthesia    Plan:     Return in 1 week for venous duplex exam to confirm closure left great saphenous vein

## 2016-02-02 NOTE — Progress Notes (Signed)
Laser Ablation Procedure    Date: 02/02/2016   [name] DOB:1946/01/07  Consent signed: Yes    Surgeon:  Dr. Nelda Severe. Kellie Simmering  Procedure: Laser Ablation: left Greater Saphenous Vein  BP 168/86 mmHg  Pulse 59  Temp(Src) 97.6 F (36.4 C)  Resp 18  Ht 5\' 8"  (1.727 m)  Wt 173 lb (78.472 kg)  BMI 26.31 kg/m2  SpO2 99%  Tumescent Anesthesia: 425 cc 0.9% NaCl with 50 cc Lidocaine HCL with 1% Epi and 15 cc 8.4% NaHCO3  Local Anesthesia: 4 cc Lidocaine HCL and NaHCO3 (ratio 2:1)  Pulsed Mode: 15 watts, 572ms delay, 1.0 duration  Total Energy: 2245             Total Pulses:  150              Total Time: 2:30     Patient tolerated procedure well  Notes: Pt will take Tylenol instead of Ibuprofen  Description of Procedure:  After marking the course of the secondary varicosities, the patient was placed on the operating table in the supine position, and the left leg was prepped and draped in sterile fashion.   Local anesthetic was administered and under ultrasound guidance the saphenous vein was accessed with a micro needle and guide wire; then the mirco puncture sheath was placed.  A guide wire was inserted saphenofemoral junction , followed by a 5 french sheath.  The position of the sheath and then the laser fiber below the junction was confirmed using the ultrasound.  Tumescent anesthesia was administered along the course of the saphenous vein using ultrasound guidance. The patient was placed in Trendelenburg position and protective laser glasses were placed on patient and staff, and the laser was fired at 15 watts continuous mode advancing 1-46mm/second for a total of 2245 joules.    Steri strips were applied to the stab wounds and ABD pads and thigh high compression stockings were applied.  Ace wrap bandages were applied over the phlebectomy sites and at the top of the saphenofemoral junction. Blood loss was less than 15 cc.  The patient ambulated out of the operating room having  tolerated the procedure well.

## 2016-02-03 ENCOUNTER — Telehealth: Payer: Self-pay | Admitting: *Deleted

## 2016-02-03 ENCOUNTER — Encounter: Payer: Self-pay | Admitting: Vascular Surgery

## 2016-02-03 NOTE — Telephone Encounter (Signed)
Pt doing well. Following all instructions. Unable to take Ibuprofen so using ice and taking Tylenol.

## 2016-02-09 ENCOUNTER — Ambulatory Visit (INDEPENDENT_AMBULATORY_CARE_PROVIDER_SITE_OTHER): Payer: Medicare Other | Admitting: Vascular Surgery

## 2016-02-09 ENCOUNTER — Ambulatory Visit (HOSPITAL_COMMUNITY)
Admission: RE | Admit: 2016-02-09 | Discharge: 2016-02-09 | Disposition: A | Discharge status: Home / Self Care | Payer: Medicare Other | Source: Ambulatory Visit | Attending: Vascular Surgery | Admitting: Vascular Surgery

## 2016-02-09 ENCOUNTER — Encounter: Payer: Self-pay | Admitting: Vascular Surgery

## 2016-02-09 VITALS — BP 161/90 | HR 64 | Resp 98 | Ht 69.0 in | Wt 175.0 lb

## 2016-02-09 DIAGNOSIS — I83892 Varicose veins of left lower extremities with other complications: Secondary | ICD-10-CM

## 2016-02-09 DIAGNOSIS — Z9889 Other specified postprocedural states: Secondary | ICD-10-CM | POA: Insufficient documentation

## 2016-02-09 DIAGNOSIS — I83812 Varicose veins of left lower extremities with pain: Secondary | ICD-10-CM | POA: Diagnosis not present

## 2016-02-09 NOTE — Progress Notes (Signed)
Subjective:     Patient ID: [name], male   DOB: Apr 30, 1946, 70 y.o.   MRN: [id number]  HPI this 70 year old male returns 1 week post-laser ablation left great saphenous vein from proximal calf to near the saphenofemoral junction performed under local tumescent anesthesia. He has had some mild discomfort in the left thigh down to the proximal calf where the ablation was performed. He's had minimal distal edema. He has worn his long leg elastic compression stocking and taken ibuprofen as instructed. He has no history of DVT.  Past Medical History  Diagnosis Date  . Chronic kidney disease   . Neuropathy ([place])   . Atrial fibrillation ([place])   . Hyperlipidemia   . Diabetes mellitus     Social History  Substance Use Topics  . Smoking status: Never Smoker   . Smokeless tobacco: Never Used  . Alcohol Use: No    Family History  Problem Relation Age of Onset  . Heart disease Mother   . Heart attack Father   . Stroke Sister     Allergies  Allergen Reactions  . Elavil [Amitriptyline Hcl]     Unknown felt as if he was tripping     Current outpatient prescriptions:  .  Alcohol Swabs (B-D SINGLE USE SWABS REGULAR) PADS, , Disp: , Rfl:  .  aspirin 81 MG tablet, Take 81 mg by mouth daily.  , Disp: , Rfl:  .  BAYER CONTOUR NEXT TEST test strip, , Disp: , Rfl:  .  BD PEN NEEDLE NANO U/F 32G X 4 MM MISC, , Disp: , Rfl:  .  calcitRIOL (ROCALTROL) 0.25 MCG capsule, , Disp: , Rfl:  .  calcium carbonate 200 MG capsule, Take 250 mg by mouth every Monday, Wednesday, and Friday. 3 times a week, Disp: , Rfl:  .  CRESTOR 5 MG tablet, Take 5 mg by mouth daily., Disp: , Rfl:  .  diphenhydrAMINE (BENADRYL) 25 mg capsule, Take 25 mg by mouth at bedtime as needed (for sleep/allergies.). , Disp: , Rfl:  .  furosemide (LASIX) 40 MG tablet, Take 40 mg by mouth daily as needed. Fluid., Disp: , Rfl:  .  gabapentin (NEURONTIN) 100 MG capsule, Take 100 mg by mouth 3 (three) times daily as needed (for  nerve pain). Take three times/day as needed, Disp: , Rfl:  .  glipiZIDE (GLUCOTROL) 10 MG tablet, Take 10 mg by mouth 2 (two) times daily before a meal.  , Disp: , Rfl:  .  LANTUS SOLOSTAR 100 UNIT/ML Solostar Pen, Inject 9 Units into the skin every morning., Disp: , Rfl:  .  linagliptin (TRADJENTA) 5 MG TABS tablet, Take 5 mg by mouth daily.  , Disp: , Rfl:  .  MICROLET LANCETS MISC, , Disp: , Rfl:  .  mycophenolate (CELLCEPT) 500 MG tablet, Take 500 mg by mouth 2 (two) times daily. , Disp: , Rfl:  .  omega-3 acid ethyl esters (LOVAZA) 1 G capsule, Take 4 g by mouth daily. , Disp: , Rfl:  .  prednisoLONE 5 MG TABS, Take 5 mg by mouth daily.  , Disp: , Rfl:  .  predniSONE (DELTASONE) 5 MG tablet, , Disp: , Rfl:  .  tacrolimus (PROGRAF) 1 MG capsule, Take 2 mg by mouth 2 (two) times daily. , Disp: , Rfl:  .  SANTYL ointment, Reported on 02/09/2016, Disp: , Rfl:  .  sulfamethoxazole-trimethoprim (BACTRIM DS,SEPTRA DS) 800-160 MG tablet, Reported on 02/09/2016, Disp: , Rfl:   Filed Vitals:  02/09/16 1349 02/09/16 1350  BP: 160/83 161/90  Pulse: 64 64  Resp: 98   Height: 5\' 9"  (1.753 m)   Weight: 175 lb (79.379 kg)   SpO2: 96%     Body mass index is 25.83 kg/(m^2).           Review of Systems denies chest pain, dyspnea on exertion, PND, orthopnea, hemoptysis, claudication.     Objective:   Physical Exam BP 161/90 mmHg  Pulse 64  Resp 98  Ht 5\' 9"  (1.753 m)  Wt 175 lb (79.379 kg)  BMI 25.83 kg/m2  SpO2 96%  Gen. well-developed well-nourished male no apparent distress alert and oriented 3 Lungs no rhonchi or wheezing Cardiovascular regular rhythm no murmurs Left leg with mild to moderate discomfort to deep palpation over the great saphenous vein. Minimal erythema at entrance site and proximal calf. Chronic hyperpigmentation. 3+ dorsalis pedis pulse palpable. No distal edema noted.  Today I ordered a venous duplex exam of the left leg which I reviewed and interpreted.  There is no DVT. There is closure of the left great saphenous vein from the proximal calf to near the saphenofemoral junction.     Assessment:     Successful laser ablation left great saphenous vein in patient with gross reflux causing pain and swelling and skin changes lower half left leg    Plan:     Patient to wear long leg elastic compression stockings for 1 additional week and we'll return to see me on when necessary basis

## 2016-02-09 NOTE — Progress Notes (Signed)
Filed Vitals:   02/09/16 1349 02/09/16 1350  BP: 160/83 161/90  Pulse: 64 64  Resp: 98   Height: 5\' 9"  (1.753 m)   Weight: 175 lb (79.379 kg)   SpO2: 96%

## 2016-04-19 DIAGNOSIS — Z940 Kidney transplant status: Secondary | ICD-10-CM | POA: Diagnosis not present

## 2016-04-19 DIAGNOSIS — E1165 Type 2 diabetes mellitus with hyperglycemia: Secondary | ICD-10-CM | POA: Diagnosis not present

## 2016-04-19 DIAGNOSIS — G609 Hereditary and idiopathic neuropathy, unspecified: Secondary | ICD-10-CM | POA: Diagnosis not present

## 2016-04-19 DIAGNOSIS — I10 Essential (primary) hypertension: Secondary | ICD-10-CM | POA: Diagnosis not present

## 2016-04-19 DIAGNOSIS — E7800 Pure hypercholesterolemia, unspecified: Secondary | ICD-10-CM | POA: Diagnosis not present

## 2016-05-04 DIAGNOSIS — H25813 Combined forms of age-related cataract, bilateral: Secondary | ICD-10-CM | POA: Diagnosis not present

## 2016-05-04 DIAGNOSIS — E1129 Type 2 diabetes mellitus with other diabetic kidney complication: Secondary | ICD-10-CM | POA: Diagnosis not present

## 2016-05-04 DIAGNOSIS — H401131 Primary open-angle glaucoma, bilateral, mild stage: Secondary | ICD-10-CM | POA: Diagnosis not present

## 2016-05-04 DIAGNOSIS — N2581 Secondary hyperparathyroidism of renal origin: Secondary | ICD-10-CM | POA: Diagnosis not present

## 2016-05-04 DIAGNOSIS — Z940 Kidney transplant status: Secondary | ICD-10-CM | POA: Diagnosis not present

## 2016-05-04 DIAGNOSIS — E119 Type 2 diabetes mellitus without complications: Secondary | ICD-10-CM | POA: Diagnosis not present

## 2016-05-04 DIAGNOSIS — H04123 Dry eye syndrome of bilateral lacrimal glands: Secondary | ICD-10-CM | POA: Diagnosis not present

## 2016-05-04 DIAGNOSIS — D509 Iron deficiency anemia, unspecified: Secondary | ICD-10-CM | POA: Diagnosis not present

## 2016-05-05 DIAGNOSIS — Z79899 Other long term (current) drug therapy: Secondary | ICD-10-CM | POA: Diagnosis not present

## 2016-05-05 DIAGNOSIS — E785 Hyperlipidemia, unspecified: Secondary | ICD-10-CM | POA: Diagnosis not present

## 2016-05-05 DIAGNOSIS — E1129 Type 2 diabetes mellitus with other diabetic kidney complication: Secondary | ICD-10-CM | POA: Diagnosis not present

## 2016-05-05 DIAGNOSIS — I129 Hypertensive chronic kidney disease with stage 1 through stage 4 chronic kidney disease, or unspecified chronic kidney disease: Secondary | ICD-10-CM | POA: Diagnosis not present

## 2016-05-05 DIAGNOSIS — I739 Peripheral vascular disease, unspecified: Secondary | ICD-10-CM | POA: Diagnosis not present

## 2016-05-05 DIAGNOSIS — Z940 Kidney transplant status: Secondary | ICD-10-CM | POA: Diagnosis not present

## 2016-05-05 DIAGNOSIS — N2581 Secondary hyperparathyroidism of renal origin: Secondary | ICD-10-CM | POA: Diagnosis not present

## 2016-05-05 DIAGNOSIS — R0601 Orthopnea: Secondary | ICD-10-CM | POA: Diagnosis not present

## 2016-05-05 DIAGNOSIS — E669 Obesity, unspecified: Secondary | ICD-10-CM | POA: Diagnosis not present

## 2016-05-05 DIAGNOSIS — E118 Type 2 diabetes mellitus with unspecified complications: Secondary | ICD-10-CM | POA: Diagnosis not present

## 2016-05-05 DIAGNOSIS — D509 Iron deficiency anemia, unspecified: Secondary | ICD-10-CM | POA: Diagnosis not present

## 2016-05-19 DIAGNOSIS — I129 Hypertensive chronic kidney disease with stage 1 through stage 4 chronic kidney disease, or unspecified chronic kidney disease: Secondary | ICD-10-CM | POA: Diagnosis not present

## 2016-05-19 DIAGNOSIS — E785 Hyperlipidemia, unspecified: Secondary | ICD-10-CM | POA: Diagnosis not present

## 2016-05-19 DIAGNOSIS — Z940 Kidney transplant status: Secondary | ICD-10-CM | POA: Diagnosis not present

## 2016-08-30 DIAGNOSIS — E1129 Type 2 diabetes mellitus with other diabetic kidney complication: Secondary | ICD-10-CM | POA: Diagnosis not present

## 2016-08-30 DIAGNOSIS — D509 Iron deficiency anemia, unspecified: Secondary | ICD-10-CM | POA: Diagnosis not present

## 2016-08-30 DIAGNOSIS — E785 Hyperlipidemia, unspecified: Secondary | ICD-10-CM | POA: Diagnosis not present

## 2016-08-30 DIAGNOSIS — E784 Other hyperlipidemia: Secondary | ICD-10-CM | POA: Diagnosis not present

## 2016-08-30 DIAGNOSIS — Z940 Kidney transplant status: Secondary | ICD-10-CM | POA: Diagnosis not present

## 2016-08-30 DIAGNOSIS — N2581 Secondary hyperparathyroidism of renal origin: Secondary | ICD-10-CM | POA: Diagnosis not present

## 2016-09-01 DIAGNOSIS — I739 Peripheral vascular disease, unspecified: Secondary | ICD-10-CM | POA: Diagnosis not present

## 2016-09-01 DIAGNOSIS — E1129 Type 2 diabetes mellitus with other diabetic kidney complication: Secondary | ICD-10-CM | POA: Diagnosis not present

## 2016-09-01 DIAGNOSIS — Z79899 Other long term (current) drug therapy: Secondary | ICD-10-CM | POA: Diagnosis not present

## 2016-09-01 DIAGNOSIS — R0601 Orthopnea: Secondary | ICD-10-CM | POA: Diagnosis not present

## 2016-09-01 DIAGNOSIS — D509 Iron deficiency anemia, unspecified: Secondary | ICD-10-CM | POA: Diagnosis not present

## 2016-09-01 DIAGNOSIS — N2581 Secondary hyperparathyroidism of renal origin: Secondary | ICD-10-CM | POA: Diagnosis not present

## 2016-09-01 DIAGNOSIS — E118 Type 2 diabetes mellitus with unspecified complications: Secondary | ICD-10-CM | POA: Diagnosis not present

## 2016-09-01 DIAGNOSIS — E785 Hyperlipidemia, unspecified: Secondary | ICD-10-CM | POA: Diagnosis not present

## 2016-09-01 DIAGNOSIS — I129 Hypertensive chronic kidney disease with stage 1 through stage 4 chronic kidney disease, or unspecified chronic kidney disease: Secondary | ICD-10-CM | POA: Diagnosis not present

## 2016-09-01 DIAGNOSIS — E669 Obesity, unspecified: Secondary | ICD-10-CM | POA: Diagnosis not present

## 2016-09-01 DIAGNOSIS — Z940 Kidney transplant status: Secondary | ICD-10-CM | POA: Diagnosis not present

## 2016-09-06 DIAGNOSIS — G609 Hereditary and idiopathic neuropathy, unspecified: Secondary | ICD-10-CM | POA: Diagnosis not present

## 2016-09-06 DIAGNOSIS — E1165 Type 2 diabetes mellitus with hyperglycemia: Secondary | ICD-10-CM | POA: Diagnosis not present

## 2016-09-06 DIAGNOSIS — E7800 Pure hypercholesterolemia, unspecified: Secondary | ICD-10-CM | POA: Diagnosis not present

## 2016-09-06 DIAGNOSIS — Z940 Kidney transplant status: Secondary | ICD-10-CM | POA: Diagnosis not present

## 2016-09-06 DIAGNOSIS — I10 Essential (primary) hypertension: Secondary | ICD-10-CM | POA: Diagnosis not present

## 2016-09-10 DIAGNOSIS — E1165 Type 2 diabetes mellitus with hyperglycemia: Secondary | ICD-10-CM | POA: Diagnosis not present

## 2016-09-16 DIAGNOSIS — Z940 Kidney transplant status: Secondary | ICD-10-CM | POA: Diagnosis not present

## 2016-09-16 DIAGNOSIS — Z23 Encounter for immunization: Secondary | ICD-10-CM | POA: Diagnosis not present

## 2016-10-28 DIAGNOSIS — Z940 Kidney transplant status: Secondary | ICD-10-CM | POA: Diagnosis not present

## 2016-10-28 DIAGNOSIS — E785 Hyperlipidemia, unspecified: Secondary | ICD-10-CM | POA: Diagnosis not present

## 2016-10-28 DIAGNOSIS — D509 Iron deficiency anemia, unspecified: Secondary | ICD-10-CM | POA: Diagnosis not present

## 2016-12-24 DIAGNOSIS — H401131 Primary open-angle glaucoma, bilateral, mild stage: Secondary | ICD-10-CM | POA: Diagnosis not present

## 2016-12-24 DIAGNOSIS — H10413 Chronic giant papillary conjunctivitis, bilateral: Secondary | ICD-10-CM | POA: Diagnosis not present

## 2016-12-24 DIAGNOSIS — H04123 Dry eye syndrome of bilateral lacrimal glands: Secondary | ICD-10-CM | POA: Diagnosis not present

## 2016-12-28 DIAGNOSIS — E1129 Type 2 diabetes mellitus with other diabetic kidney complication: Secondary | ICD-10-CM | POA: Diagnosis not present

## 2016-12-28 DIAGNOSIS — E785 Hyperlipidemia, unspecified: Secondary | ICD-10-CM | POA: Diagnosis not present

## 2016-12-28 DIAGNOSIS — N2581 Secondary hyperparathyroidism of renal origin: Secondary | ICD-10-CM | POA: Diagnosis not present

## 2016-12-28 DIAGNOSIS — Z940 Kidney transplant status: Secondary | ICD-10-CM | POA: Diagnosis not present

## 2016-12-28 DIAGNOSIS — D509 Iron deficiency anemia, unspecified: Secondary | ICD-10-CM | POA: Diagnosis not present

## 2016-12-29 DIAGNOSIS — E118 Type 2 diabetes mellitus with unspecified complications: Secondary | ICD-10-CM | POA: Diagnosis not present

## 2016-12-29 DIAGNOSIS — I129 Hypertensive chronic kidney disease with stage 1 through stage 4 chronic kidney disease, or unspecified chronic kidney disease: Secondary | ICD-10-CM | POA: Diagnosis not present

## 2016-12-29 DIAGNOSIS — R0600 Dyspnea, unspecified: Secondary | ICD-10-CM | POA: Diagnosis not present

## 2016-12-29 DIAGNOSIS — I739 Peripheral vascular disease, unspecified: Secondary | ICD-10-CM | POA: Diagnosis not present

## 2016-12-29 DIAGNOSIS — E669 Obesity, unspecified: Secondary | ICD-10-CM | POA: Diagnosis not present

## 2016-12-29 DIAGNOSIS — D509 Iron deficiency anemia, unspecified: Secondary | ICD-10-CM | POA: Diagnosis not present

## 2016-12-29 DIAGNOSIS — Z940 Kidney transplant status: Secondary | ICD-10-CM | POA: Diagnosis not present

## 2016-12-29 DIAGNOSIS — E785 Hyperlipidemia, unspecified: Secondary | ICD-10-CM | POA: Diagnosis not present

## 2016-12-29 DIAGNOSIS — N2581 Secondary hyperparathyroidism of renal origin: Secondary | ICD-10-CM | POA: Diagnosis not present

## 2016-12-29 DIAGNOSIS — R0601 Orthopnea: Secondary | ICD-10-CM | POA: Diagnosis not present

## 2016-12-29 DIAGNOSIS — Z79899 Other long term (current) drug therapy: Secondary | ICD-10-CM | POA: Diagnosis not present

## 2016-12-29 DIAGNOSIS — E1129 Type 2 diabetes mellitus with other diabetic kidney complication: Secondary | ICD-10-CM | POA: Diagnosis not present

## 2017-01-20 ENCOUNTER — Other Ambulatory Visit (HOSPITAL_COMMUNITY): Payer: Medicare Other

## 2017-02-01 ENCOUNTER — Other Ambulatory Visit (HOSPITAL_COMMUNITY): Payer: Medicare Other

## 2017-02-03 DIAGNOSIS — R0600 Dyspnea, unspecified: Secondary | ICD-10-CM | POA: Insufficient documentation

## 2017-02-03 NOTE — Progress Notes (Signed)
Cardiology Office Note    Date:  02/04/2017   ID:  [name], DOB 1946/04/01, MRN [id number]  PCP:  [name], MD  Cardiologist: Sinclair Grooms, MD   Chief Complaint  Patient presents with  . Atrial Fibrillation  . Shortness of Breath    History of Present Illness:  [name] is a 71 y.o. male referred by [facility] for evaluation of dyspnea occurring predominantly when supine.  For the past 3-4 months the patient has had episodes of PND. He has to sit at the bedside to allow dyspnea to improve. On the other hand, he is able to mow his grass, walking behind his lung more without significant dyspnea. He has noticed some lower extremity swelling. He denies chest discomfort. No palpitations or episodes of syncope. PND is not associated with chest discomfort.  Not on anticoagulation therapy though he has chronic atrial fibrillation and a CHADS VASC > 2.  Past Medical History:  Diagnosis Date  . Atrial fibrillation ([place])   . Chronic kidney disease   . Diabetes mellitus   . Hyperlipidemia   . Neuropathy     Past Surgical History:  Procedure Laterality Date  . access proceds    . achlles tendon repair    . ARTERIOVENOUS GRAFT PLACEMENT    . bowel repla     repair  . [place] and 2005  . LIGATION GORETEX GRAFT  06/30/11   Right upper arm    Current Medications: Outpatient Medications Prior to Visit  Medication Sig Dispense Refill  . Alcohol Swabs (B-D SINGLE USE SWABS REGULAR) PADS Use as directed    . aspirin 81 MG tablet Take 81 mg by mouth daily.      Marland Kitchen BAYER CONTOUR NEXT TEST test strip Use as directed    . BD PEN NEEDLE NANO U/F 32G X 4 MM MISC Use as directed    . calcitRIOL (ROCALTROL) 0.25 MCG capsule Take 0.25 mcg by mouth 3 (three) times a week. (Mondays, Wednesdays, and Fridays)    . diphenhydrAMINE (BENADRYL) 25 mg capsule Take 25 mg by mouth at bedtime as needed (for sleep/allergies.).     Marland Kitchen furosemide (LASIX)  40 MG tablet Take 40 mg by mouth daily as needed. Fluid.    Marland Kitchen gabapentin (NEURONTIN) 100 MG capsule Take 100 mg by mouth 3 (three) times daily as needed (for nerve pain). Take three times/day as needed    . glipiZIDE (GLUCOTROL) 10 MG tablet Take 10 mg by mouth 2 (two) times daily before a meal.      . LANTUS SOLOSTAR 100 UNIT/ML Solostar Pen Inject 6 units into the skin each morning and 8 units into the skin each evening.    . linagliptin (TRADJENTA) 5 MG TABS tablet Take 5 mg by mouth daily.      Marland Kitchen MICROLET LANCETS MISC Use as directed    . mycophenolate (CELLCEPT) 500 MG tablet Take 500 mg by mouth 2 (two) times daily.     . prednisoLONE 5 MG TABS Take 5 mg by mouth daily.      Marland Kitchen SANTYL ointment Reported on 02/09/2016    . tacrolimus (PROGRAF) 1 MG capsule Take 2 mg by mouth 2 (two) times daily.     . calcium carbonate 200 MG capsule Take 250 mg by mouth every Monday, Wednesday, and Friday. 3 times a week    . CRESTOR 5 MG tablet Take 5 mg by mouth daily.    Marland Kitchen  omega-3 acid ethyl esters (LOVAZA) 1 G capsule Take 4 g by mouth daily.     . predniSONE (DELTASONE) 5 MG tablet     . sulfamethoxazole-trimethoprim (BACTRIM DS,SEPTRA DS) 800-160 MG tablet Reported on 02/09/2016     No facility-administered medications prior to visit.      Allergies:   Elavil [amitriptyline hcl]   Social History   Social History  . Marital status: Married    Spouse name: N/A  . Number of children: N/A  . Years of education: N/A   Social History Main Topics  . Smoking status: Never Smoker  . Smokeless tobacco: Never Used  . Alcohol use No  . Drug use: No  . Sexual activity: Not Asked   Other Topics Concern  . None   Social History Narrative  . None     Family History:  The patient's family history includes Heart attack in his father; Heart disease in his mother; Stroke in his sister.   ROS:   Please see the history of present illness.    Constipation, snores loudly at night, past history of  atrial fibrillation. Not currently on anticoagulation. States he has had prior GI bleeding with an unknown source. Did not consent to starting anticoagulation when diagnosed with atrial fibrillation in the past.  All other systems reviewed and are negative.   PHYSICAL EXAM:   VS:  BP (!) 158/80 (BP Location: Left Arm)   Pulse (!) 58   Ht 5\' 8"  (1.727 m)   Wt 192 lb (87.1 kg)   BMI 29.19 kg/m    GEN: Well nourished, well developed, in no acute distress  HEENT: normal  Neck: carotid bruits, or masses. There is moderate jugular vein distention with neck veins visible one half the way up the neck and the patient is sitting. [place] waves are noted Cardiac: RRR; no murmurs, rubs, or gallops,no edema  Respiratory:  clear to auscultation bilaterally, normal work of breathing GI: soft, nontender, nondistended, + BS MS: no deformity or atrophy  Skin: warm and dry, no rash Neuro:  Alert and Oriented x 3, Strength and sensation are intact Psych: euthymic mood, full affect  Wt Readings from Last 3 Encounters:  02/04/17 192 lb (87.1 kg)  02/09/16 175 lb (79.4 kg)  02/02/16 173 lb (78.5 kg)      Studies/Labs Reviewed:   EKG:  EKG  atrial flutter with 41 AV conduction. Prominent voltage consistent with LVH. Nonspecific ST-T wave abnormality. Cannot exclude ischemia. When compared to 2012 no significant change in rhythm is noted.  Recent Labs: No results found for requested labs within last 8760 hours.   Lipid Panel No results found for: CHOL, TRIG, HDL, CHOLHDL, VLDL, LDLCALC, LDLDIRECT  Additional studies/ records that were reviewed today include:  Reviewed available old EKGs.  No available cardiac studies  Will get information from [facility] cardiology concerning prior workup. This is probably greater than 10 years ago.  ASSESSMENT:    1. Dyspnea, unspecified type   2. Chronic atrial fibrillation (HCC)   3. Chronic anticoagulation   4. Kidney transplant status, cadaveric   5. SOB  (shortness of breath)      PLAN:  In order of problems listed above:  1. Etiology is uncertain. Multiple possibilities include anginal equivalent with diastolic heart failure secondary to chronic ischemia. Left ventricular systolic dysfunction as a possibility. Volume overload related to impairment of function of the transplant. A 2-D Doppler echocardiogram will be done to assess LV size and function as  well as right ventricular size and function. We will also perform a stress Myoview myocardial ischemia. 2. Chronic atrial flutter/fibrillation, unchanged from the most recent EKG performed in 2012. 3. The patient should be on chronic anticoagulation.CHADS VASC > 2. Based on conversation today, anticoagulation was previously recommended long-term but refused by the patient because of a prior history of gastrointestinal bleeding without anticoagulation therapy. He was reluctant to begin therapy and have a greater risk of recurrent bleed. 4. No recent data concerning kidney function. Last creatinine within the [place] is 1.24 in 2012. 5. Addressed above.  Overall the patient's exam is not compatible with gross fluid overload. He does have elevated neck veins and therefore some component of right heart failure. This also associated with mild lower extremity swelling. An echocardiogram will help assess right heart and left heart function and size. Myocardial ischemia as the basis for dyspnea will be excluded with a Lexiscan or stress Myoview.  Medication Adjustments/Labs and Tests Ordered: Current medicines are reviewed at length with the patient today.  Concerns regarding medicines are outlined above.  Medication changes, Labs and Tests ordered today are listed in the Patient Instructions below. Patient Instructions  Medication Instructions:  None  Labwork: None  Testing/Procedures: Your physician has requested that you have an echocardiogram. Echocardiography is a painless test that  uses sound waves to create images of your heart. It provides your doctor with information about the size and shape of your heart and how well your heart's chambers and valves are working. This procedure takes approximately one hour. There are no restrictions for this procedure.  Your physician has requested that you have en exercise stress myoview. For further information please visit [email or web address]. Please follow instruction sheet, as given.   Follow-Up: Your physician recommends that you schedule a follow-up appointment in: 4-6 weeks with Dr. Tamala Julian.    Any Other Special Instructions Will Be Listed Below (If Applicable).     If you need a refill on your cardiac medications before your next appointment, please call your pharmacy.      Signed, Sinclair Grooms, MD  02/04/2017 12:48 PM    [facility] [place], [place], [place]  [address] Phone: [phone]; Fax: [phone]

## 2017-02-04 ENCOUNTER — Ambulatory Visit (INDEPENDENT_AMBULATORY_CARE_PROVIDER_SITE_OTHER): Payer: Medicare Other | Admitting: Interventional Cardiology

## 2017-02-04 ENCOUNTER — Encounter: Payer: Self-pay | Admitting: Interventional Cardiology

## 2017-02-04 VITALS — BP 158/80 | HR 58 | Ht 68.0 in | Wt 192.0 lb

## 2017-02-04 DIAGNOSIS — R0602 Shortness of breath: Secondary | ICD-10-CM

## 2017-02-04 DIAGNOSIS — Z940 Kidney transplant status: Secondary | ICD-10-CM

## 2017-02-04 DIAGNOSIS — R0600 Dyspnea, unspecified: Secondary | ICD-10-CM | POA: Diagnosis not present

## 2017-02-04 DIAGNOSIS — I482 Chronic atrial fibrillation, unspecified: Secondary | ICD-10-CM

## 2017-02-04 DIAGNOSIS — Z7901 Long term (current) use of anticoagulants: Secondary | ICD-10-CM | POA: Diagnosis not present

## 2017-02-04 NOTE — Patient Instructions (Addendum)
Medication Instructions:  None  Labwork: None  Testing/Procedures: Your physician has requested that you have an echocardiogram. Echocardiography is a painless test that uses sound waves to create images of your heart. It provides your doctor with information about the size and shape of your heart and how well your heart's chambers and valves are working. This procedure takes approximately one hour. There are no restrictions for this procedure.  Your physician has requested that you have en exercise stress myoview. For further information please visit [email or web address]. Please follow instruction sheet, as given.   Follow-Up: Your physician recommends that you schedule a follow-up appointment in: 4-6 weeks with Dr. Tamala Julian.    Any Other Special Instructions Will Be Listed Below (If Applicable).     If you need a refill on your cardiac medications before your next appointment, please call your pharmacy.

## 2017-02-15 ENCOUNTER — Telehealth (HOSPITAL_COMMUNITY): Payer: Self-pay | Admitting: *Deleted

## 2017-02-15 NOTE — Telephone Encounter (Signed)
Patient given detailed instructions per Myocardial Perfusion Study Information Sheet for the test on 02/16/17 at 0715. Patient notified to arrive 15 minutes early and that it is imperative to arrive on time for appointment to keep from having the test rescheduled.  If you need to cancel or reschedule your appointment, please call the office within 24 hours of your appointment. . Patient verbalized understanding.[name], Ranae Palms

## 2017-02-17 ENCOUNTER — Ambulatory Visit (HOSPITAL_COMMUNITY): Payer: Medicare Other | Attending: Interventional Cardiology

## 2017-02-17 ENCOUNTER — Other Ambulatory Visit: Payer: Self-pay

## 2017-02-17 ENCOUNTER — Ambulatory Visit (HOSPITAL_BASED_OUTPATIENT_CLINIC_OR_DEPARTMENT_OTHER): Payer: Medicare Other

## 2017-02-17 DIAGNOSIS — I061 Rheumatic aortic insufficiency: Secondary | ICD-10-CM | POA: Diagnosis not present

## 2017-02-17 DIAGNOSIS — R9439 Abnormal result of other cardiovascular function study: Secondary | ICD-10-CM | POA: Diagnosis not present

## 2017-02-17 DIAGNOSIS — R0600 Dyspnea, unspecified: Secondary | ICD-10-CM

## 2017-02-17 DIAGNOSIS — R0602 Shortness of breath: Secondary | ICD-10-CM

## 2017-02-17 DIAGNOSIS — I4891 Unspecified atrial fibrillation: Secondary | ICD-10-CM | POA: Insufficient documentation

## 2017-02-17 DIAGNOSIS — I420 Dilated cardiomyopathy: Secondary | ICD-10-CM | POA: Diagnosis not present

## 2017-02-17 LAB — MYOCARDIAL PERFUSION IMAGING
CSEPPHR: 74 {beats}/min
RATE: 0.26
Rest HR: 58 {beats}/min
SDS: 3
SRS: 4
SSS: 7
TID: 0.96

## 2017-02-17 MED ORDER — REGADENOSON 0.4 MG/5ML IV SOLN
0.4000 mg | Freq: Once | INTRAVENOUS | Status: AC
  Administered 2017-02-17: 0.4 mg via INTRAVENOUS

## 2017-02-17 MED ORDER — TECHNETIUM TC 99M TETROFOSMIN IV KIT
30.7000 | PACK | Freq: Once | INTRAVENOUS | Status: AC | PRN
  Administered 2017-02-17: 30.7 via INTRAVENOUS
  Filled 2017-02-17: qty 31

## 2017-02-17 MED ORDER — TECHNETIUM TC 99M TETROFOSMIN IV KIT
10.1000 | PACK | Freq: Once | INTRAVENOUS | Status: AC | PRN
  Administered 2017-02-17: 10.1 via INTRAVENOUS
  Filled 2017-02-17: qty 11

## 2017-03-01 DIAGNOSIS — E785 Hyperlipidemia, unspecified: Secondary | ICD-10-CM | POA: Diagnosis not present

## 2017-03-01 DIAGNOSIS — Z940 Kidney transplant status: Secondary | ICD-10-CM | POA: Diagnosis not present

## 2017-03-01 DIAGNOSIS — D509 Iron deficiency anemia, unspecified: Secondary | ICD-10-CM | POA: Diagnosis not present

## 2017-03-09 ENCOUNTER — Encounter: Payer: Self-pay | Admitting: Interventional Cardiology

## 2017-03-24 NOTE — Progress Notes (Signed)
Cardiology Office Note    Date:  03/25/2017   ID:  [name], DOB 06/25/1946, MRN [id number]  PCP:  [name], MD  Cardiologist: Sinclair Grooms, MD   Chief Complaint  Patient presents with  . Coronary Artery Disease  . Shortness of Breath    History of Present Illness:  [name] is a 71 y.o. male for evaluation of dyspnea occurring predominantly when supine.Low risk myocardial perfusion study earlier this year, 2018. He has history of end-stage kidney disease and is now status post renal transplantation (the second time) with continuing stable function according to the patient. He is followed by Dr.Coladonato at the [facility].  Was seen about a month ago because of orthopnea. We have undertaken a cardiac evaluation to determine etiology. He has a low risk myocardial perfusion study. Echocardiography demonstrated severe LVH with diastolic dysfunction. He has to prop up on pillows to sleep.  Past Medical History:  Diagnosis Date  . Atrial fibrillation ([place])   . Chronic kidney disease   . Diabetes mellitus   . Hyperlipidemia   . Neuropathy     Past Surgical History:  Procedure Laterality Date  . access proceds    . achlles tendon repair    . ARTERIOVENOUS GRAFT PLACEMENT    . bowel repla     repair  . [place] and 2005  . LIGATION GORETEX GRAFT  06/30/11   Right upper arm    Current Medications: Outpatient Medications Prior to Visit  Medication Sig Dispense Refill  . Alcohol Swabs (B-D SINGLE USE SWABS REGULAR) PADS Use as directed    . aspirin 81 MG tablet Take 81 mg by mouth daily.      Marland Kitchen atorvastatin (LIPITOR) 10 MG tablet Take 10 mg by mouth daily.    Marland Kitchen BAYER CONTOUR NEXT TEST test strip Use as directed    . BD PEN NEEDLE NANO U/F 32G X 4 MM MISC Use as directed    . calcitRIOL (ROCALTROL) 0.25 MCG capsule Take 0.25 mcg by mouth 3 (three) times a week. (Mondays, Wednesdays, and Fridays)    .  diphenhydrAMINE (BENADRYL) 25 mg capsule Take 25 mg by mouth at bedtime as needed (for sleep/allergies.).     Marland Kitchen furosemide (LASIX) 40 MG tablet Take 40 mg by mouth daily as needed. Fluid.    Marland Kitchen gabapentin (NEURONTIN) 100 MG capsule Take 100 mg by mouth 3 (three) times daily as needed (for nerve pain). Take three times/day as needed    . glipiZIDE (GLUCOTROL) 10 MG tablet Take 10 mg by mouth 2 (two) times daily before a meal.      . LANTUS SOLOSTAR 100 UNIT/ML Solostar Pen Inject 6 units into the skin each morning and 8 units into the skin each evening.    . linagliptin (TRADJENTA) 5 MG TABS tablet Take 5 mg by mouth daily.      Marland Kitchen MICROLET LANCETS MISC Use as directed    . mycophenolate (CELLCEPT) 500 MG tablet Take 500 mg by mouth 2 (two) times daily.     . prednisoLONE 5 MG TABS Take 5 mg by mouth daily.      Marland Kitchen SANTYL ointment Reported on 02/09/2016    . tacrolimus (PROGRAF) 1 MG capsule Take 2 mg by mouth 2 (two) times daily.     Marland Kitchen VASCEPA 1 g CAPS Take 4 g by mouth daily.    . carvedilol (COREG) 3.125 MG tablet Take 3.125 mg  by mouth 2 (two) times daily.     No facility-administered medications prior to visit.      Allergies:   Elavil [amitriptyline hcl]   Social History   Social History  . Marital status: Married    Spouse name: N/A  . Number of children: N/A  . Years of education: N/A   Social History Main Topics  . Smoking status: Never Smoker  . Smokeless tobacco: Never Used  . Alcohol use No  . Drug use: No  . Sexual activity: Not Asked   Other Topics Concern  . None   Social History Narrative  . None     Family History:  The patient's family history includes Heart attack in his father; Heart disease in his mother; Stroke in his sister.   ROS:   Please see the history of present illness.    Denies exertion-related chest discomfort and exertional dyspnea. Some difficulty with balance. Only complaint is orthopnea.  All other systems reviewed and are  negative.   PHYSICAL EXAM:   VS:  BP (!) 164/86 (BP Location: Left Arm)   Pulse 64   Ht 5\' 8"  (1.727 m)   Wt 186 lb 9.6 oz (84.6 kg)   BMI 28.37 kg/m    GEN: Well nourished, well developed, in no acute distress  HEENT: normal  Neck: no JVD, carotid bruits, or masses Cardiac: RRR; no murmurs, rubs, or gallops,no edema  Respiratory:  clear to auscultation bilaterally, normal work of breathing GI: soft, nontender, nondistended, + BS MS: no deformity or atrophy  Skin: warm and dry, no rash Neuro:  Alert and Oriented x 3, Strength and sensation are intact Psych: euthymic mood, full affect  Wt Readings from Last 3 Encounters:  03/25/17 186 lb 9.6 oz (84.6 kg)  02/17/17 192 lb (87.1 kg)  02/04/17 192 lb (87.1 kg)      Studies/Labs Reviewed:   EKG:  EKG  Not repeated  Recent Labs: No results found for requested labs within last 8760 hours.   Lipid Panel No results found for: CHOL, TRIG, HDL, CHOLHDL, VLDL, LDLCALC, LDLDIRECT  Additional studies/ records that were reviewed today include:  Echocardiogram 02/17/17: ------------------------------------------------------------------- Study Conclusions  - Left ventricle: The cavity size was normal. Wall thickness was   increased in a pattern of moderate LVH. Systolic function was   normal. The estimated ejection fraction was in the range of 50%   to 55%. - Aortic valve: There was mild to moderate regurgitation. - Left atrium: The atrium was mildly dilated. - Right ventricle: Systolic function was mildly reduced. - Right atrium: The atrium was mildly dilated.  Myocardial perfusion imaging May 2018: Study Highlights     There was no ST segment deviation noted during stress.  No T wave inversion was noted during stress.  Defect 1: There is a medium defect of moderate severity.  This is a low risk study.   Medium size, moderate intensity fixed septal defect, likely artifact, however, the study was not gated due to  atrial flutter. No reversible ischemia. Low risk study.     ASSESSMENT:    1. Dyspnea, unspecified type   2. Chronic diastolic heart failure ([place])   3. Essential hypertension   4. ESRD (end stage renal disease) ([place])   5. Chronic anticoagulation   6. Chronic atrial fibrillation (HCC)      PLAN:  In order of problems listed above:  1. Related to chronic diastolic heart failure given workup noted above. Plan to better control  blood pressure by increasing carvedilol to 6.25 mg by mouth twice a day. A second consideration of dyspnea does not improve would be to slightly increased diuretic regimen. I will leave this to the discretion of Dr. Marval Regal.  2. Control blood pressure to 140/90 mmHg. Keep blood pressures above 110/55 mmHg. As mentioned above we have slightly increase carvedilol to 6.25 mg by mouth twice a day 3. The increasing carvedilol will help blood pressure control. The increasing carvedilol could excessively slow heart rate so he will return in 3 weeks for repeat blood pressure evaluation and EKG 4. Followed by nephrology. If heart rate is too slow on carvedilol the only other way to treat his diastolic heart failure will be increased diuretic therapy. Will let Dr. Marval Regal have the final say on this. 5. No bleeding complications. 6. Heart rate is under good control currently. Slight increase in carvedilol dose has been ordered. Check EKG on return.    Medication Adjustments/Labs and Tests Ordered: Current medicines are reviewed at length with the patient today.  Concerns regarding medicines are outlined above.  Medication changes, Labs and Tests ordered today are listed in the Patient Instructions below. Patient Instructions  Medication Instructions:  1) INCREASE Carvedilol to 6.25mg  twice daily  Labwork: None  Testing/Procedures: None  Follow-Up: Your physician recommends that you schedule a follow-up appointment in: 1 month with [facility]  Your  physician wants you to follow-up in: 1 year with Dr. Tamala Julian.  You will receive a reminder letter in the mail two months in advance. If you don't receive a letter, please call our office to schedule the follow-up appointment.    Any Other Special Instructions Will Be Listed Below (If Applicable).     If you need a refill on your cardiac medications before your next appointment, please call your pharmacy.      Signed, Sinclair Grooms, MD  03/25/2017 11:56 AM    [facility] [place], [place], [place]  [address] Phone: [phone]; Fax: [phone]

## 2017-03-25 ENCOUNTER — Ambulatory Visit (INDEPENDENT_AMBULATORY_CARE_PROVIDER_SITE_OTHER): Payer: Medicare Other | Admitting: Interventional Cardiology

## 2017-03-25 ENCOUNTER — Encounter: Payer: Self-pay | Admitting: Interventional Cardiology

## 2017-03-25 VITALS — BP 164/86 | HR 64 | Ht 68.0 in | Wt 186.6 lb

## 2017-03-25 DIAGNOSIS — I482 Chronic atrial fibrillation, unspecified: Secondary | ICD-10-CM

## 2017-03-25 DIAGNOSIS — Z7901 Long term (current) use of anticoagulants: Secondary | ICD-10-CM | POA: Diagnosis not present

## 2017-03-25 DIAGNOSIS — R0600 Dyspnea, unspecified: Secondary | ICD-10-CM | POA: Diagnosis not present

## 2017-03-25 DIAGNOSIS — I5032 Chronic diastolic (congestive) heart failure: Secondary | ICD-10-CM | POA: Diagnosis not present

## 2017-03-25 DIAGNOSIS — I10 Essential (primary) hypertension: Secondary | ICD-10-CM | POA: Diagnosis not present

## 2017-03-25 DIAGNOSIS — N186 End stage renal disease: Secondary | ICD-10-CM

## 2017-03-25 MED ORDER — CARVEDILOL 6.25 MG PO TABS: 6.2500 mg | ORAL_TABLET | Freq: Two times a day (BID) | ORAL | 3 refills | Status: DC

## 2017-03-25 NOTE — Patient Instructions (Addendum)
Medication Instructions:  1) INCREASE Carvedilol to 6.25mg  twice daily  Labwork: None  Testing/Procedures: None  Follow-Up: Your physician recommends that you schedule a follow-up appointment in: 1 month with [facility]  Your physician wants you to follow-up in: 1 year with Dr. Tamala Julian.  You will receive a reminder letter in the mail two months in advance. If you don't receive a letter, please call our office to schedule the follow-up appointment.    Any Other Special Instructions Will Be Listed Below (If Applicable).     If you need a refill on your cardiac medications before your next appointment, please call your pharmacy.

## 2017-04-18 ENCOUNTER — Encounter (HOSPITAL_COMMUNITY): Payer: Self-pay | Admitting: Emergency Medicine

## 2017-04-18 ENCOUNTER — Emergency Department (HOSPITAL_COMMUNITY)
Admission: EM | Admit: 2017-04-18 | Discharge: 2017-04-18 | Disposition: A | Discharge status: Home / Self Care | Payer: Medicare Other | Attending: Emergency Medicine | Admitting: Emergency Medicine

## 2017-04-18 ENCOUNTER — Emergency Department (HOSPITAL_COMMUNITY): Payer: Medicare Other

## 2017-04-18 DIAGNOSIS — Z7982 Long term (current) use of aspirin: Secondary | ICD-10-CM | POA: Diagnosis not present

## 2017-04-18 DIAGNOSIS — X58XXXA Exposure to other specified factors, initial encounter: Secondary | ICD-10-CM | POA: Diagnosis not present

## 2017-04-18 DIAGNOSIS — I110 Hypertensive heart disease with heart failure: Secondary | ICD-10-CM | POA: Insufficient documentation

## 2017-04-18 DIAGNOSIS — S60221A Contusion of right hand, initial encounter: Secondary | ICD-10-CM | POA: Insufficient documentation

## 2017-04-18 DIAGNOSIS — T63441A Toxic effect of venom of bees, accidental (unintentional), initial encounter: Secondary | ICD-10-CM

## 2017-04-18 DIAGNOSIS — I5032 Chronic diastolic (congestive) heart failure: Secondary | ICD-10-CM | POA: Diagnosis not present

## 2017-04-18 DIAGNOSIS — E1140 Type 2 diabetes mellitus with diabetic neuropathy, unspecified: Secondary | ICD-10-CM | POA: Diagnosis not present

## 2017-04-18 DIAGNOSIS — Y929 Unspecified place or not applicable: Secondary | ICD-10-CM | POA: Diagnosis not present

## 2017-04-18 DIAGNOSIS — N186 End stage renal disease: Secondary | ICD-10-CM | POA: Insufficient documentation

## 2017-04-18 DIAGNOSIS — M7989 Other specified soft tissue disorders: Secondary | ICD-10-CM | POA: Diagnosis not present

## 2017-04-18 DIAGNOSIS — R2231 Localized swelling, mass and lump, right upper limb: Secondary | ICD-10-CM | POA: Diagnosis present

## 2017-04-18 DIAGNOSIS — E1122 Type 2 diabetes mellitus with diabetic chronic kidney disease: Secondary | ICD-10-CM | POA: Diagnosis not present

## 2017-04-18 DIAGNOSIS — Y939 Activity, unspecified: Secondary | ICD-10-CM | POA: Insufficient documentation

## 2017-04-18 DIAGNOSIS — Z79899 Other long term (current) drug therapy: Secondary | ICD-10-CM | POA: Insufficient documentation

## 2017-04-18 DIAGNOSIS — Z96611 Presence of right artificial shoulder joint: Secondary | ICD-10-CM | POA: Diagnosis not present

## 2017-04-18 DIAGNOSIS — Y999 Unspecified external cause status: Secondary | ICD-10-CM | POA: Insufficient documentation

## 2017-04-18 NOTE — Discharge Instructions (Signed)
The swelling, bruising, and redness of your right hand is due to the combination of hitting your hand, followed by the hornet sting.   There is no sign of infection, fracture or complications from your veins or arteries.  The best treatment at this time is elevation, as much as possible, above your heart. Avoid repetitive motions with the right hand, lifting and further injuries.   Also, Heat application with a heating pad 3-4 times a day will help.  For pain, take Tylenol every 4 hours.  Return here if needed for problems.

## 2017-04-18 NOTE — ED Provider Notes (Signed)
By signing my name below, I, Soijett Blue, attest that this documentation has been prepared under the direction and in the presence of Lenn Sink, PA-C Electronically Signed: Soijett Blue, ED Scribe. 04/18/17. 3:11 PM.  HPI:  [name] is a 71 y.o. male with a PMHx of CKD, DM, A-fib, who presents to the Emergency Department complaining of intermittent right hand swelling onset 5 days ago. Pt reports associated redness to right hand. Pt has tried epsom salt soaks without medications with no relief of his symptoms. He notes that he accidentally stuck his right hand on an unknown object while at work prior to the onset of the swelling. He notes that he has had a kidney transplant completed in the past. Pt reports that he takes a daily 81 mg ASA. He denies fever, wound, right hand pain, and any other symptoms.   Pt screened in Fast Track, transplant pt with questionable cellulitis. Pt will need further care outside of fast track.    I personally performed the services described in this documentation, which was scribed in my presence. The recorded information has been reviewed and is accurate.    Okey Regal, PA-C 04/18/17 1536    Gareth Morgan, MD 04/22/17 1058

## 2017-04-18 NOTE — ED Triage Notes (Signed)
Patient reports that right hand had swelling last Tuesday morning and went down then started swelling again on Thursday.  Patient reports that he has hit hand on stuff causing the swelling.

## 2017-04-18 NOTE — ED Provider Notes (Signed)
[facility] DEPT Provider Note   CSN: [id number] Arrival date & time: 04/18/17  1331     History   Chief Complaint Chief Complaint  Patient presents with  . hand swelling    HPI [name] is a 71 y.o. male. He presents for evaluation of right hand swelling, starting about a week ago when he accidentally hit it against a wall.  He had initial swelling that improved after 2 or 3 days, then recurred after he was stung by a hornet in the right dorsal forearm.  He presents for evaluation of painful swelling, without associated fever, chills, nausea, vomiting, weakness or dizziness.  He is taking his usual medications.  He works as a Presenter, broadcasting.  There are  no other known modifying factors.     HPI  Past Medical History:  Diagnosis Date  . Atrial fibrillation ([place])   . Chronic kidney disease   . Diabetes mellitus   . Hyperlipidemia   . Neuropathy     Patient Active Problem List   Diagnosis Date Noted  . Chronic diastolic heart failure ([place]) 03/25/2017  . Essential hypertension 03/25/2017  . Chronic anticoagulation 02/04/2017  . Dyspnea 02/03/2017  . 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) 10/15/2015  . Kidney transplant status, cadaveric 10/15/2015  . Immunosuppressed status ([place]) 10/15/2015  . Paronychia of third finger of right hand 07/16/2011  . ESRD (end stage renal disease) ([place]) 07/16/2011    Past Surgical History:  Procedure Laterality Date  . access proceds    . achlles tendon repair    . ARTERIOVENOUS GRAFT PLACEMENT    . bowel repla     repair  . [place] and 2005  . LIGATION GORETEX GRAFT  06/30/11   Right upper arm       Home Medications    Prior to Admission medications   Medication Sig Start Date End Date Taking? Authorizing Provider  Alcohol Swabs (B-D SINGLE USE SWABS REGULAR) PADS Use as directed 09/07/15   [provider]  aspirin 81 MG tablet Take 81 mg by mouth daily.      [provider]  atorvastatin (LIPITOR) 10 MG tablet Take 10 mg by mouth daily.    [provider]  BAYER CONTOUR NEXT TEST test strip Use as directed 09/12/15   [provider]  BD PEN NEEDLE NANO U/F 32G X 4 MM MISC Use as directed 09/25/15   [provider]  calcitRIOL (ROCALTROL) 0.25 MCG capsule Take 0.25 mcg by mouth 3 (three) times a week. (Mondays, Wednesdays, and Fridays) 07/22/15   [provider]  carvedilol (COREG) 6.25 MG tablet Take 1 tablet (6.25 mg total) by mouth 2 (two) times daily. 03/25/17   Belva Crome, MD  diphenhydrAMINE (BENADRYL) 25 mg capsule Take 25 mg by mouth at bedtime as needed (for sleep/allergies.).     [provider]  furosemide (LASIX) 40 MG tablet Take 40 mg by mouth daily as needed. Fluid. 06/17/14   [provider]  gabapentin (NEURONTIN) 100 MG capsule Take 100 mg by mouth 3 (three) times daily as needed (for nerve pain). Take three times/day as needed    [provider]  glipiZIDE (GLUCOTROL) 10 MG tablet Take 10 mg by mouth 2 (two) times daily before a meal.      [provider]  LANTUS SOLOSTAR 100 UNIT/ML Solostar Pen Inject 6 units into the skin each morning and 8 units into the skin  each evening. 06/04/14   [provider]  linagliptin (TRADJENTA) 5 MG TABS tablet Take 5 mg by mouth daily.      [provider]  MICROLET LANCETS MISC Use as directed 07/14/15   [provider]  mycophenolate (CELLCEPT) 500 MG tablet Take 500 mg by mouth 2 (two) times daily.     [provider]  prednisoLONE 5 MG TABS Take 5 mg by mouth daily.      [provider]  SANTYL ointment Reported on 02/09/2016 09/29/15   [provider]  tacrolimus (PROGRAF) 1 MG capsule Take 2 mg by mouth 2 (two) times daily.     [provider]  VASCEPA 1 g CAPS Take 4 g by mouth daily. 11/24/16   [provider]    Family History Family History  Problem  Relation Age of Onset  . Heart disease Mother   . Heart attack Father   . Stroke Sister     Social History Social History  Substance Use Topics  . Smoking status: Never Smoker  . Smokeless tobacco: Never Used  . Alcohol use No     Allergies   Elavil [amitriptyline hcl]   Review of Systems Review of Systems  All other systems reviewed and are negative.    Physical Exam Updated Vital Signs BP (!) 167/93 (BP Location: Left Arm)   Pulse 68   Temp 98.2 F (36.8 C) (Oral)   Resp 18   SpO2 98%   Physical Exam  Constitutional: He is oriented to person, place, and time. He appears well-developed and well-nourished.  HENT:  Head: Normocephalic and atraumatic.  Right Ear: External ear normal.  Left Ear: External ear normal.  Eyes: Pupils are equal, round, and reactive to light. Conjunctivae and EOM are normal.  Neck: Normal range of motion and phonation normal. Neck supple.  Cardiovascular: Normal rate.   Nonfunctioning grafts left and right upper arms, from prior dialysis treatments.  Pulmonary/Chest: Effort normal. He exhibits no bony tenderness.  Musculoskeletal: Normal range of motion.  Right dorsal hand, and wrist with ecchymosis, subacute appearing with mild proximal erythema, at the site of the hornet sting.  No associated drainage, bleeding or fluctuance.  Neurological: He is alert and oriented to person, place, and time. No cranial nerve deficit or sensory deficit. He exhibits normal muscle tone. Coordination normal.  Skin: Skin is warm, dry and intact.  Psychiatric: He has a normal mood and affect. His behavior is normal. Judgment and thought content normal.  Nursing note and vitals reviewed.    ED Treatments / Results  Labs (all labs ordered are listed, but only abnormal results are displayed) Labs Reviewed - No data to display  EKG  EKG Interpretation None       Radiology Dg Hand Complete Right  Result Date: 04/18/2017 CLINICAL DATA:  Right hand  swelling that has fluctuated between Tuesday and Thursday. Patient reports swelling is from blunt trauma. Initial encounter. EXAM: RIGHT HAND - COMPLETE 3+ VIEW COMPARISON:  None. FINDINGS: Dorsal hand soft tissue swelling.  No acute fracture or dislocation. Second and third MCP joint narrowing with possible marginal erosions at the second MCP joint. No gouty type calcifications. No historical indication of infectious symptoms. Osteopenia and diffuse arterial calcification. IMPRESSION: 1. Soft tissue swelling without fracture or opaque foreign body. 2. Second and third MCP arthropathy with nonspecific marginal erosive changes at the second MCP joint. 3. Osteopenia and widespread arterial calcification. Electronically Signed   By: Monte Fantasia  M.D.   On: 04/18/2017 14:28    Procedures Procedures (including critical care time)  Medications Ordered in ED Medications - No data to display   Initial Impression / Assessment and Plan / ED Course  I have reviewed the triage vital signs and the nursing notes.  Pertinent labs & imaging results that were available during my care of the patient were reviewed by me and considered in my medical decision making (see chart for details).      Patient Vitals for the past 24 hrs:  BP Temp Temp src Pulse Resp SpO2  04/18/17 1546 132/84 98 F (36.7 C) Oral 87 14 97 %  04/18/17 1340 (!) 167/93 98.2 F (36.8 C) Oral 68 18 98 %    At Discharge-he remains comfortable and has no other complaints.  Reevaluation with update and discussion. After initial assessment and treatment, an updated evaluation reveals he remains comfortable and has no further complaints.Daleen Bo L    Final Clinical Impressions(s) / ED Diagnoses   Final diagnoses:  Contusion of right hand, initial encounter  Bee sting reaction, accidental or unintentional, initial encounter    Doubt cellulitis, abscess, lymphangitis. Swelling right wrist and hand, secondary to contusion and  hornet sting.   Nursing Notes Reviewed/ Care Coordinated Applicable Imaging Reviewed Interpretation of Laboratory Data incorporated into ED treatment  The patient appears reasonably screened and/or stabilized for discharge and I doubt any other medical condition or other [facility] requiring further screening, evaluation, or treatment in the ED at this time prior to discharge.  Plan: Home Medications-continue usual medications; Home Treatments-heat therapy and elevation; return here if the recommended treatment, does not improve the symptoms; Recommended follow up-return here as needed    New Prescriptions New Prescriptions   No medications on file     Daleen Bo, MD 04/19/17 0000

## 2017-04-25 ENCOUNTER — Ambulatory Visit (INDEPENDENT_AMBULATORY_CARE_PROVIDER_SITE_OTHER): Payer: Medicare Other | Admitting: Pharmacist

## 2017-04-25 VITALS — BP 144/80 | HR 54

## 2017-04-25 DIAGNOSIS — I10 Essential (primary) hypertension: Secondary | ICD-10-CM

## 2017-04-25 NOTE — Patient Instructions (Signed)
It was nice meeting you today!  Your blood pressure was slightly above your goal. Start taking your furosemide 40 mg by mouth 1 tablet every day. Continue taking your carvedilol 6.25 mg by mouth 1 tablet twice daily.   Start checking your blood pressure at home and keep a log of the readings and bring them with you to the clinic at your next follow up visit.   Please contact the clinic if you experience dizziness, lightheadedness, weakness, or fatigue. [phone].  Follow up at hypertension clinic in 05/25/17 at 11:30 AM.

## 2017-04-25 NOTE — Progress Notes (Signed)
Patient ID: [name]                 DOB: [birth year]                      MRN: [id number]     HPI: [name] is a 71 y.o. male referred by Dr. Tamala Julian to HTN clinic. PMH is significant for atrial fibrillation, end-stage kidney disease s/p 2 kidney transplants, diabetes mellitus, hyperlipidemia, neuropathy, and diastolic heart failure. At his most recent office visit on 03/25/17, pt complained of dysnpea. Pt's BP reading at the visit was 164/86 mmHg, and HR was 64. Pt's carvedilol dose was increased from 3.125 mg BID to 6.25 mg BID. Pt presents today for f/u.   Pt presents to clinic in good spirits. Pt states that he has been tolerating the dose increase of carvedilol. Pt denies fatigue, weakness, headaches, dizziness, or falls. Before coming to clinic, he took one dose of carvedilol, and he has not had furosemide in the past 2 days. He states that he usually takes furosemide 6x a week to help control swelling in his legs. 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 He does not check BP at home. BP reading at clinic is 144/80 mmHg and HR is 54.   Current HTN meds: carvedilol 6.25 mg, furosemide 40 mg  BP goal: <140/90 mmHg per Dr Tamala Julian  Family History: The patient's family history includes Heart attack in his father; Heart disease in his mother; Stroke in his sister.   Social History: Pt has never used tobacco. Denies any alcohol or illicit drug use.   Diet: Pt states that he tries to eat healthy throughout the day with veggies and chicken. Drinks plenty of water at home. Has 1 and a half cups of coffee about 5 days a week. Does not add sodium to food.   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   Home BP readings: Has home cuff but does not check BP at home.   Wt Readings from Last 3 Encounters:  03/25/17 186 lb 9.6 oz (84.6 kg)  02/17/17 192 lb (87.1 kg)  02/04/17 192 lb (87.1 kg)   BP Readings from  Last 3 Encounters:  04/18/17 132/84  03/25/17 (!) 164/86  02/04/17 (!) 158/80   Pulse Readings from Last 3 Encounters:  04/18/17 87  03/25/17 64  02/04/17 (!) 58    Renal function: CrCl cannot be calculated (Patient's most recent lab result is older than the maximum 21 days allowed.).  Past Medical History:  Diagnosis Date  . Atrial fibrillation ([place])   . Chronic kidney disease   . Diabetes mellitus   . Hyperlipidemia   . Neuropathy     Current Outpatient Prescriptions on File Prior to Visit  Medication Sig Dispense Refill  . Alcohol Swabs (B-D SINGLE USE SWABS REGULAR) PADS Use as directed    . aspirin 81 MG tablet Take 81 mg by mouth daily.      Marland Kitchen atorvastatin (LIPITOR) 10 MG tablet Take 10 mg by mouth daily.    Marland Kitchen BAYER CONTOUR NEXT TEST test strip Use as directed    . BD PEN NEEDLE NANO U/F 32G X 4 MM MISC Use as directed    . calcitRIOL (ROCALTROL) 0.25 MCG capsule Take 0.25 mcg by mouth 3 (three) times a week. (Mondays, Wednesdays, and Fridays)    . carvedilol (COREG) 6.25 MG tablet  Take 1 tablet (6.25 mg total) by mouth 2 (two) times daily. 180 tablet 3  . diphenhydrAMINE (BENADRYL) 25 mg capsule Take 25 mg by mouth at bedtime as needed (for sleep/allergies.).     Marland Kitchen furosemide (LASIX) 40 MG tablet Take 40 mg by mouth daily as needed. Fluid.    Marland Kitchen gabapentin (NEURONTIN) 100 MG capsule Take 100 mg by mouth 3 (three) times daily as needed (for nerve pain). Take three times/day as needed    . glipiZIDE (GLUCOTROL) 10 MG tablet Take 10 mg by mouth 2 (two) times daily before a meal.      . LANTUS SOLOSTAR 100 UNIT/ML Solostar Pen Inject 6 units into the skin each morning and 8 units into the skin each evening.    . linagliptin (TRADJENTA) 5 MG TABS tablet Take 5 mg by mouth daily.      Marland Kitchen MICROLET LANCETS MISC Use as directed    . mycophenolate (CELLCEPT) 500 MG tablet Take 500 mg by mouth 2 (two) times daily.     . prednisoLONE 5 MG TABS Take 5 mg by mouth daily.      Marland Kitchen SANTYL  ointment Reported on 02/09/2016    . tacrolimus (PROGRAF) 1 MG capsule Take 2 mg by mouth 2 (two) times daily.     Marland Kitchen VASCEPA 1 g CAPS Take 4 g by mouth daily.     No current facility-administered medications on file prior to visit.     Allergies  Allergen Reactions  . Elavil [Amitriptyline Hcl]     Unknown felt as if he was tripping     Assessment/Plan:  1. Hypertension - Pt's BP reading today was slightly above his goal of 140/90 mmHg. Pt reports that he takes furosemide 40 mg 6x per week, but did not take it this morning prior to the visit or the day before the visit which may have contributed to the increase in BP. Will continue carvedilol 6.25 mg and change furosemide 40 mg to a scheduled daily dose. Encourage pt to check BP at home and exercise for 30 minutes 3 to 4x per week and continue to eat a healthy, low sodium diet. F/u with pt in one month.   Barkley Boards, PharmD Student.   Megan E. Supple, PharmD, CPP, [place] [address] [place], [place] [address] Phone: [phone]; Fax: [phone] 04/25/2017 1:26 PM

## 2017-04-27 DIAGNOSIS — E1129 Type 2 diabetes mellitus with other diabetic kidney complication: Secondary | ICD-10-CM | POA: Diagnosis not present

## 2017-04-27 DIAGNOSIS — N2581 Secondary hyperparathyroidism of renal origin: Secondary | ICD-10-CM | POA: Diagnosis not present

## 2017-04-27 DIAGNOSIS — E785 Hyperlipidemia, unspecified: Secondary | ICD-10-CM | POA: Diagnosis not present

## 2017-04-27 DIAGNOSIS — D509 Iron deficiency anemia, unspecified: Secondary | ICD-10-CM | POA: Diagnosis not present

## 2017-04-27 DIAGNOSIS — Z940 Kidney transplant status: Secondary | ICD-10-CM | POA: Diagnosis not present

## 2017-04-28 DIAGNOSIS — I739 Peripheral vascular disease, unspecified: Secondary | ICD-10-CM | POA: Diagnosis not present

## 2017-04-28 DIAGNOSIS — Z940 Kidney transplant status: Secondary | ICD-10-CM | POA: Diagnosis not present

## 2017-04-28 DIAGNOSIS — I129 Hypertensive chronic kidney disease with stage 1 through stage 4 chronic kidney disease, or unspecified chronic kidney disease: Secondary | ICD-10-CM | POA: Diagnosis not present

## 2017-04-28 DIAGNOSIS — D509 Iron deficiency anemia, unspecified: Secondary | ICD-10-CM | POA: Diagnosis not present

## 2017-04-28 DIAGNOSIS — R0601 Orthopnea: Secondary | ICD-10-CM | POA: Diagnosis not present

## 2017-04-28 DIAGNOSIS — E118 Type 2 diabetes mellitus with unspecified complications: Secondary | ICD-10-CM | POA: Diagnosis not present

## 2017-04-28 DIAGNOSIS — E669 Obesity, unspecified: Secondary | ICD-10-CM | POA: Diagnosis not present

## 2017-04-28 DIAGNOSIS — N2581 Secondary hyperparathyroidism of renal origin: Secondary | ICD-10-CM | POA: Diagnosis not present

## 2017-04-28 DIAGNOSIS — Z6829 Body mass index (BMI) 29.0-29.9, adult: Secondary | ICD-10-CM | POA: Diagnosis not present

## 2017-04-28 DIAGNOSIS — E1129 Type 2 diabetes mellitus with other diabetic kidney complication: Secondary | ICD-10-CM | POA: Diagnosis not present

## 2017-04-28 DIAGNOSIS — Z79899 Other long term (current) drug therapy: Secondary | ICD-10-CM | POA: Diagnosis not present

## 2017-04-28 DIAGNOSIS — E785 Hyperlipidemia, unspecified: Secondary | ICD-10-CM | POA: Diagnosis not present

## 2017-05-25 ENCOUNTER — Ambulatory Visit (INDEPENDENT_AMBULATORY_CARE_PROVIDER_SITE_OTHER): Payer: Medicare Other | Admitting: Pharmacist

## 2017-05-25 VITALS — BP 168/88 | HR 41

## 2017-05-25 DIAGNOSIS — I10 Essential (primary) hypertension: Secondary | ICD-10-CM

## 2017-05-25 MED ORDER — LOSARTAN POTASSIUM 25 MG PO TABS: 25.0000 mg | ORAL_TABLET | Freq: Every day | ORAL | 11 refills | Status: DC

## 2017-05-25 NOTE — Progress Notes (Signed)
Patient ID: [name]                 DOB: July 27, 1946                      MRN: [id number]     HPI: [name] is a 71 y.o. male referred by Dr. Tamala Julian to HTN clinic. 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 At his most recent office visit on 04/25/17, pt's BP reading was 144/80 mmHg and HR of 54bpm after increasing carvedilol dose from 3.192m BID to 6.279mBID on 03/25/17. Plan on 04/25/17 was to change furosemide 4077mrom PRN (pt was taking 6x/wk) to a scheduled daily dose.   Pt had f[facility]u with [facility]/17 and presented with trace pitting edema on both ankles and stated he had not taken his Lasix that morning. Dr. ColMarval Regalcommended increasing Lasix to 86m32mD for several days, to restrict sodium and fluid, and f/u with Dr. SmitTamala Julian. ColaMarval Regaltes pt has stable CKD stage 2-3 with baseline SCr 1.4-1.5, SCr has fluctuated by has been slowly rising over the last 3 months.  04/27/17 SCr: 1.69mg67m eGFR 41 mL/min/1.73, K+ 4.1 mmol/L. BP during visit was 160/80mmH37mepeat 140/82mmHg46m 66bpm. Dr ColadonMarval Regals that pt be started on losartan 25mg da37mif BP readings remain above goal today to help prevent progression of CKD.  Pt presents today for f/u. Denies light headedness, headaches, or dizziness.  He is experiencing an increase in fatigue. He has not taken his Lasix this morning due to causing him to urinate throughout the day, he stated he will take it once he gets home. Pt has been checking his BP twice daily at home with BP ranges from 120/67 mmHg to 160/67mmHg. 14mrrent HTN meds: Carvedilol 6.25 mg BID, furosemide 86mg dail20mP goal: <140/90 mmHg per Dr Smith, <13Tamala Julianper Dr. ColadonatoMarval Regalistory: The patient's family history includes Heart attack in his father; Heart disease in his  mother; Stroke in his sister.  Social History: Pt has never used tobacco. Denies any alcohol or illicit drug use.   Diet: Pt states that he tries to eat healthy throughout the day with veggies and chicken. Drinks plenty of water at home. Has 16 oz. of coffee about 5 days a week. Trying to cut down on sodium intake.  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   Home BP readings: last 3 days: 9/3: 150/66mmHg, 1458mmmHg, 9/251m4/65mmHg, 148/49mHg, 9/1: 53m65mmHg.  Wt Re62mgs from Last 3 Encounters:  03/25/17 186 lb 9.6 oz (84.6 kg)  02/17/17 192 lb (87.1 kg)  02/04/17 192 lb (87.1 kg)   BP Readings from Last 3 Encounters:  04/25/17 (!) 144/80  04/18/17 132/84  03/25/17 (!) 164/86   Pulse Readings from Last 3 Encounters:  04/25/17 (!) 54  04/18/17 87  03/25/17 64    Renal function: CrCl cannot be calculated (Patient's most recent lab result is older than the maximum 21 days allowed.).  Past Medical History:  Diagnosis Date  . Atrial fibrillation ([facility])   . ChroniBlack Eagleidney disease   . Diabetes mellitus   . Hyperlipidemia   . Neuropathy     Current Outpatient Prescriptions on File Prior to Visit  Medication Sig Dispense Refill  .  Alcohol Swabs (B-D SINGLE USE SWABS REGULAR) PADS Use as directed    . aspirin 81 MG tablet Take 81 mg by mouth daily.      Marland Kitchen atorvastatin (LIPITOR) 10 MG tablet Take 10 mg by mouth daily.    Marland Kitchen BAYER CONTOUR NEXT TEST test strip Use as directed    . BD PEN NEEDLE NANO U/F 32G X 4 MM MISC Use as directed    . calcitRIOL (ROCALTROL) 0.25 MCG capsule Take 0.25 mcg by mouth 3 (three) times a week. (Mondays, Wednesdays, and Fridays)    . carvedilol (COREG) 6.25 MG tablet Take 1 tablet (6.25 mg total) by mouth 2 (two) times daily. 180 tablet 3  . diphenhydrAMINE (BENADRYL) 25 mg capsule Take 25 mg by mouth at bedtime as needed (for sleep/allergies.).     Marland Kitchen furosemide (LASIX) 40 MG  tablet Take 1 tablet (40 mg total) by mouth daily. Fluid. 30 tablet   . gabapentin (NEURONTIN) 100 MG capsule Take 100 mg by mouth 3 (three) times daily as needed (for nerve pain). Take three times/day as needed    . glipiZIDE (GLUCOTROL) 10 MG tablet Take 10 mg by mouth 2 (two) times daily before a meal.      . LANTUS SOLOSTAR 100 UNIT/ML Solostar Pen Inject 6 units into the skin each morning and 8 units into the skin each evening.    . linagliptin (TRADJENTA) 5 MG TABS tablet Take 5 mg by mouth daily.      Marland Kitchen MICROLET LANCETS MISC Use as directed    . mycophenolate (CELLCEPT) 500 MG tablet Take 500 mg by mouth 2 (two) times daily.     . prednisoLONE 5 MG TABS Take 5 mg by mouth daily.      Marland Kitchen SANTYL ointment Reported on 02/09/2016    . tacrolimus (PROGRAF) 1 MG capsule Take 2 mg by mouth 2 (two) times daily.     Marland Kitchen VASCEPA 1 g CAPS Take 4 g by mouth daily.     No current facility-administered medications on file prior to visit.     Allergies  Allergen Reactions  . Elavil [Amitriptyline Hcl]     Unknown felt as if he was tripping     Assessment/Plan:  1. Hypertension: Pt's BP reading today was 168/88 mmHg above goal <130/5mHg per renal recommendation. Pt has not taken Lasix dose today, but states compliance with other BP meds. Will start losartan 215mdaily for renal protection in CKD and to lower BP. Will continue carvedilol 6.2567mID and Lasix 6m61mily. F/u in HTN clinic in 2 weeks for BP check and BMET. Encouraged patient to continue checking BP at home and to bring in log of readings to his next appt.   Will route note to renal MD.  Patient seen with KaleMaple MirzaarmD student and [place]land[place] EdisonY1 resident.  Demetria Lightsey E. Supple, PharmD, CPP, BCACBethel [id number] [id number]: [phone]: [phone]/2018 12:31 PM

## 2017-05-25 NOTE — Patient Instructions (Signed)
It was nice to see you today. Your blood pressure was above your goal today of <130/80 mmHg.   We are adding a new medication called Losartan 25 mg, take this once daily. Follow-up for your blood pressure is on 06/08/2017 at 11:00 AM with lab work following. Continue all of your other medications. Continue to take your blood pressure at home.

## 2017-06-08 ENCOUNTER — Other Ambulatory Visit: Payer: Medicare Other | Admitting: *Deleted

## 2017-06-08 ENCOUNTER — Ambulatory Visit (INDEPENDENT_AMBULATORY_CARE_PROVIDER_SITE_OTHER): Payer: Medicare Other | Admitting: Pharmacist

## 2017-06-08 VITALS — BP 149/80 | HR 77 | Wt 185.2 lb

## 2017-06-08 DIAGNOSIS — I10 Essential (primary) hypertension: Secondary | ICD-10-CM

## 2017-06-08 LAB — BASIC METABOLIC PANEL
BUN / CREAT RATIO: 24 (ref 10–24)
BUN: 48 mg/dL — AB (ref 8–27)
CALCIUM: 10.2 mg/dL (ref 8.6–10.2)
CHLORIDE: 108 mmol/L — AB (ref 96–106)
CO2: 19 mmol/L — ABNORMAL LOW (ref 20–29)
CREATININE: 2.04 mg/dL — AB (ref 0.76–1.27)
GFR calc Af Amer: 37 mL/min/{1.73_m2} — ABNORMAL LOW (ref >59)
GFR calc non Af Amer: 32 mL/min/{1.73_m2} — ABNORMAL LOW (ref >59)
GLUCOSE: 76 mg/dL (ref 65–99)
Potassium: 3.8 mmol/L (ref 3.5–5.2)
Sodium: 143 mmol/L (ref 134–144)

## 2017-06-08 NOTE — Progress Notes (Signed)
Patient ID: [name]                 DOB: April 12, 1946                      MRN: [id number]     HPI: [name] is a 71 y.o. male referred by Dr. Tamala Julian to HTN clinic. 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 Pt had f/u with [place] on 04/28/17 and presented with trace pitting edema on both ankles and stated he had not taken his Lasix that morning. Dr. Marval Regal recommended increasing Lasix to 40mg  BID for several days, to restrict sodium and fluid, and f/u with Dr. Tamala Julian. Dr. 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 Pt was seen in HTN clinic 2 weeks ago with plan to start losartan if BP was elevated per renal recommendation. Pt was started on losartan 25mg  daily and presents today for BMET and BP follow up.  Pt reports feeling well since starting losartan. He denies dizziness, blurred vision, headache, or falls. He has checked his BP at home and recalls readings in the 130s/70s. He took his medications 3 hours ago and has not had much caffeine this morning.  Current HTN meds: losartan 25mg  daily, carvedilol 6.25mg  BID, furosemide 40mg  daily  BP goal: <140/90 mmHg per Dr Tamala Julian, <130/80 per Dr. Marval Regal  Family History: The patient's family history includes Heart attack in his father; Heart disease in his mother; Stroke in his sister.  Social History: Pt has never used tobacco. Denies any alcohol or illicit drug use.   Diet: Pt states that he tries to eat healthy throughout the day with veggies and chicken. Drinks plenty of water at home. Has 16 oz. of coffee about 5 days a week. Trying to cut down on sodium intake.  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   Labs: 04/27/17: SCr 1.69, K 4.1 (prior to losartan start)  Wt Readings from Last 3 Encounters:  03/25/17 186 lb 9.6 oz (84.6 kg)  02/17/17 192 lb (87.1 kg)  02/04/17 192 lb (87.1 kg)   BP Readings from Last 3 Encounters:  05/25/17 (!) 168/88  04/25/17 (!) 144/80  04/18/17 132/84   Pulse Readings from Last 3 Encounters:  05/25/17 (!) 41  04/25/17 (!) 54  04/18/17 87    Renal function: CrCl cannot be calculated (Patient's most recent lab result is older than the maximum 21 days allowed.).  Past Medical History:  Diagnosis Date  . Atrial fibrillation ([place])   . Chronic kidney disease   . Diabetes mellitus   . Hyperlipidemia   . Neuropathy     Current Outpatient Prescriptions on File Prior to Visit  Medication Sig Dispense Refill  . acetaminophen (TYLENOL) 500 MG tablet Take 500 mg by mouth as needed (1-2x daily prn pain).    . Alcohol Swabs (B-D SINGLE USE SWABS REGULAR) PADS Use as directed    . aspirin 81 MG tablet Take 81 mg by mouth daily.      Marland Kitchen atorvastatin (LIPITOR) 10 MG tablet Take 10 mg by mouth daily.    Marland Kitchen BAYER CONTOUR NEXT TEST test strip Use as directed    .  BD PEN NEEDLE NANO U/F 32G X 4 MM MISC Use as directed    . calcitRIOL (ROCALTROL) 0.25 MCG capsule Take 0.25 mcg by mouth 3 (three) times a week. (Mondays, Wednesdays, and Fridays)    . carvedilol (COREG) 6.25 MG tablet Take 1 tablet (6.25 mg total) by mouth 2 (two) times daily. 180 tablet 3  . diphenhydrAMINE (BENADRYL) 25 mg capsule Take 25 mg by mouth at bedtime as needed (for sleep/allergies.).     Marland Kitchen furosemide (LASIX) 40 MG tablet Take 1 tablet (40 mg total) by mouth daily. Fluid. 30 tablet   . gabapentin (NEURONTIN) 100 MG capsule Take 100 mg by mouth 3 (three) times daily as needed (for nerve pain). Take three times/day as needed    . glipiZIDE (GLUCOTROL) 10 MG tablet Take 10 mg by mouth 2 (two) times daily before a meal.      . LANTUS SOLOSTAR 100 UNIT/ML Solostar Pen Inject 6  units into the skin each morning and 8 units into the skin each evening.    . linagliptin (TRADJENTA) 5 MG TABS tablet Take 5 mg by mouth daily.      Marland Kitchen losartan (COZAAR) 25 MG tablet Take 1 tablet (25 mg total) by mouth daily. 30 tablet 11  . MICROLET LANCETS MISC Use as directed    . mycophenolate (CELLCEPT) 500 MG tablet Take 500 mg by mouth 2 (two) times daily.     . prednisoLONE 5 MG TABS Take 5 mg by mouth daily.      Marland Kitchen SANTYL ointment Reported on 02/09/2016    . tacrolimus (PROGRAF) 1 MG capsule Take 1 mg by mouth 2 (two) times daily.     Marland Kitchen VASCEPA 1 g CAPS Take 4 g by mouth daily.     No current facility-administered medications on file prior to visit.     Allergies  Allergen Reactions  . Elavil [Amitriptyline Hcl]     Unknown felt as if he was tripping     Assessment/Plan:  1. Hypertension - Systolic BP has improved by 24mmHg since last visit 2 weeks ago although BP still remains above goL <130/43mmHg. Checking BMET today due to recent losartan start. If labs are stable, will plan to increase losartan to 50mg  daily and f/u in 2 weeks. Otherwise, will plan to increase carvedilol. F/u tomorrow via telephone with medication plan.   Megan E. Supple, PharmD, CPP, [facility] [address], [place], [place] [address] Phone: [phone]; Fax: [phone] 06/08/2017 11:52 AM

## 2017-06-08 NOTE — Patient Instructions (Signed)
It was nice to see you today, your blood pressure is improving!  Your goal blood pressure is less than 130/80  Depending on how your labs look from today, we will either increase your losartan or your carvedilol. We will give you a call tomorrow with the plan

## 2017-06-09 ENCOUNTER — Telehealth: Payer: Self-pay | Admitting: Pharmacist

## 2017-06-09 ENCOUNTER — Other Ambulatory Visit: Payer: Self-pay | Admitting: Pharmacist

## 2017-06-09 MED ORDER — CARVEDILOL 12.5 MG PO TABS: 12.5000 mg | ORAL_TABLET | Freq: Two times a day (BID) | ORAL | 3 refills | Status: DC

## 2017-06-09 NOTE — Telephone Encounter (Signed)
BMET was drawn at renal visit with Dr Marval Regal on 04/27/17: SCr 1.69, K 4.1 (prior to losartan start) with plan to start losartan for renal protection. SCr has increased 17% with addition of losartan up to 2.04. Would prefer to continue low dose ARB to help prevent progression of CKD, however will not increase losartan dose. BP was elevated at visit yesterday - will instead increase carvedilol to 12.5mg  BID and f/u with pt in HTN clinic in 3 weeks for BP check and f/u BMET to ensure SCr remains stable. Pt aware of plan and will stay well hydrated.

## 2017-06-22 ENCOUNTER — Emergency Department
Admission: EM | Admit: 2017-06-22 | Discharge: 2017-06-22 | Disposition: A | Discharge status: Home / Self Care | Payer: Medicare Other | Attending: Emergency Medicine | Admitting: Emergency Medicine

## 2017-06-22 ENCOUNTER — Encounter: Payer: Self-pay | Admitting: Medical Oncology

## 2017-06-22 DIAGNOSIS — Z7982 Long term (current) use of aspirin: Secondary | ICD-10-CM | POA: Insufficient documentation

## 2017-06-22 DIAGNOSIS — Y999 Unspecified external cause status: Secondary | ICD-10-CM | POA: Diagnosis not present

## 2017-06-22 DIAGNOSIS — E1122 Type 2 diabetes mellitus with diabetic chronic kidney disease: Secondary | ICD-10-CM | POA: Insufficient documentation

## 2017-06-22 DIAGNOSIS — Z79899 Other long term (current) drug therapy: Secondary | ICD-10-CM | POA: Insufficient documentation

## 2017-06-22 DIAGNOSIS — I132 Hypertensive heart and chronic kidney disease with heart failure and with stage 5 chronic kidney disease, or end stage renal disease: Secondary | ICD-10-CM | POA: Insufficient documentation

## 2017-06-22 DIAGNOSIS — Y748 Miscellaneous general hospital and personal-use devices associated with adverse incidents, not elsewhere classified: Secondary | ICD-10-CM | POA: Insufficient documentation

## 2017-06-22 DIAGNOSIS — Z5189 Encounter for other specified aftercare: Secondary | ICD-10-CM | POA: Insufficient documentation

## 2017-06-22 DIAGNOSIS — Z940 Kidney transplant status: Secondary | ICD-10-CM | POA: Diagnosis not present

## 2017-06-22 DIAGNOSIS — Z794 Long term (current) use of insulin: Secondary | ICD-10-CM | POA: Insufficient documentation

## 2017-06-22 DIAGNOSIS — Y939 Activity, unspecified: Secondary | ICD-10-CM | POA: Diagnosis not present

## 2017-06-22 DIAGNOSIS — I5032 Chronic diastolic (congestive) heart failure: Secondary | ICD-10-CM | POA: Insufficient documentation

## 2017-06-22 DIAGNOSIS — N186 End stage renal disease: Secondary | ICD-10-CM | POA: Diagnosis not present

## 2017-06-22 DIAGNOSIS — S61411A Laceration without foreign body of right hand, initial encounter: Secondary | ICD-10-CM | POA: Insufficient documentation

## 2017-06-22 DIAGNOSIS — Y929 Unspecified place or not applicable: Secondary | ICD-10-CM | POA: Insufficient documentation

## 2017-06-22 DIAGNOSIS — S61412A Laceration without foreign body of left hand, initial encounter: Secondary | ICD-10-CM | POA: Diagnosis not present

## 2017-06-22 MED ORDER — LIDOCAINE HCL (PF) 1 % IJ SOLN: 5.0000 mL | Freq: Once | INTRAMUSCULAR | Status: DC

## 2017-06-22 MED ORDER — BACITRACIN ZINC 500 UNIT/GM EX OINT
TOPICAL_OINTMENT | Freq: Two times a day (BID) | CUTANEOUS | Status: DC
  Filled 2017-06-22: qty 0.9

## 2017-06-22 MED ORDER — BACITRACIN ZINC 500 UNIT/GM EX OINT: TOPICAL_OINTMENT | Freq: Once | CUTANEOUS | Status: DC

## 2017-06-22 NOTE — ED Provider Notes (Signed)
[facility] Emergency Department Provider Note   ____________________________________________   I have reviewed the triage vital signs and the nursing notes.   HISTORY  Chief Complaint Wound Check    HPI [name] is a 71 y.o. male presents to emergency department with skin tear along the right hand. Patient reports having a small cut on hand that he covered with a waterproof Band-Aid.He states he left the Band-Aid on for several days and when he removed it he sustained a skin tear. Patient is a kidney transplant patient and reports difficulty with delayed healing. He wanted to have the wound evaluated to ensure it was not severe or infected. Patient denies any other complaints at this time. Patient denies fever, chills, headache, vision changes, chest pain, chest tightness, shortness of breath, abdominal pain, nausea and vomiting.  Past Medical History:  Diagnosis Date  . Atrial fibrillation ([place])   . Chronic kidney disease   . Diabetes mellitus   . Hyperlipidemia   . Neuropathy     Patient Active Problem List   Diagnosis Date Noted  . Chronic diastolic heart failure ([place]) 03/25/2017  . Essential hypertension 03/25/2017  . Chronic anticoagulation 02/04/2017  . Dyspnea 02/03/2017  . Varicose veins of lower extremities with ulcer ([place]) 10/15/2015  . Kidney transplant status, cadaveric 10/15/2015  . Immunosuppressed status ([place]) 10/15/2015  . Paronychia of third finger of right hand 07/16/2011  . ESRD (end stage renal disease) ([place]) 07/16/2011    Past Surgical History:  Procedure Laterality Date  . access proceds    . achlles tendon repair    . ARTERIOVENOUS GRAFT PLACEMENT    . bowel repla     repair  . [place] and 2005  . LIGATION GORETEX GRAFT  06/30/11   Right upper arm    Prior to Admission medications   Medication Sig Start Date End Date Taking? Authorizing Provider  acetaminophen (TYLENOL) 500 MG tablet  Take 500 mg by mouth as needed (1-2x daily prn pain).    [provider]  Alcohol Swabs (B-D SINGLE USE SWABS REGULAR) PADS Use as directed 09/07/15   [provider]  aspirin 81 MG tablet Take 81 mg by mouth daily.      [provider]  atorvastatin (LIPITOR) 10 MG tablet Take 10 mg by mouth daily.    [provider]  BAYER CONTOUR NEXT TEST test strip Use as directed 09/12/15   [provider]  BD PEN NEEDLE NANO U/F 32G X 4 MM MISC Use as directed 09/25/15   [provider]  calcitRIOL (ROCALTROL) 0.25 MCG capsule Take 0.25 mcg by mouth 3 (three) times a week. (Mondays, Wednesdays, and Fridays) 07/22/15   [provider]  carvedilol (COREG) 12.5 MG tablet Take 1 tablet (12.5 mg total) by mouth 2 (two) times daily. 06/09/17   Belva Crome, MD  diphenhydrAMINE (BENADRYL) 25 mg capsule Take 25 mg by mouth at bedtime as needed (for sleep/allergies.).     [provider]  furosemide (LASIX) 40 MG tablet Take 1 tablet (40 mg total) by mouth daily. Fluid. 04/25/17   Belva Crome, MD  gabapentin (NEURONTIN) 100 MG capsule Take 100 mg by mouth 3 (three) times daily as needed (for nerve pain). Take three times/day as needed    [provider]  glipiZIDE (GLUCOTROL) 10 MG tablet Take 10 mg by mouth 2 (two) times daily before a meal.      [provider]  LANTUS SOLOSTAR 100 UNIT/ML Solostar Pen Inject 6 units into the skin each morning and 8 units into the skin each evening. 06/04/14   [provider]  linagliptin (TRADJENTA) 5 MG TABS tablet Take 5 mg by mouth daily.      [provider]  losartan (COZAAR) 25 MG tablet Take 1 tablet (25 mg total) by mouth daily. 05/25/17 08/23/17  Belva Crome, MD  MICROLET LANCETS MISC Use as directed 07/14/15   [provider]  mycophenolate (CELLCEPT) 500 MG tablet Take 500 mg by mouth 2 (two) times daily.     [provider]  prednisoLONE 5 MG TABS  Take 5 mg by mouth daily.      [provider]  SANTYL ointment Reported on 02/09/2016 09/29/15   [provider]  tacrolimus (PROGRAF) 1 MG capsule Take 1 mg by mouth 2 (two) times daily.     [provider]  VASCEPA 1 g CAPS Take 4 g by mouth daily. 11/24/16   [provider]    Allergies Elavil [amitriptyline hcl]  Family History  Problem Relation Age of Onset  . Heart disease Mother   . Heart attack Father   . Stroke Sister     Social History Social History  Substance Use Topics  . Smoking status: Never Smoker  . Smokeless tobacco: Never Used  . Alcohol use No    Review of Systems Constitutional: Negative for fever/chills Cardiovascular: Denies chest pain. Respiratory: Denies cough. Denies shortness of breath. Skin: Negative for rash. Skin tear laceration along the dorsal aspect of the right hand.  Neurological: Negative for headaches.  ____________________________________________   PHYSICAL EXAM:  VITAL SIGNS: ED Triage Vitals  Enc Vitals Group     BP 06/22/17 0855 (!) 151/97     Pulse Rate 06/22/17 0855 81     Resp 06/22/17 0855 17     Temp 06/22/17 0855 97.7 F (36.5 C)     Temp Source 06/22/17 0855 Oral     SpO2 06/22/17 0855 97 %     Weight 06/22/17 0852 185 lb (83.9 kg)     Height --      Head Circumference --      Peak Flow --      Pain Score 06/22/17 0852 3     Pain Loc --      Pain Edu? --      Excl. in [place]? --     Constitutional: Alert and oriented. Well appearing and in no acute distress.  Cardiovascular: Normal rate, regular rhythm.  Good peripheral circulation. Respiratory: Normal respiratory effort without tachypnea or retractions. Lungs CTAB.  Cardiovascular: Normal rate, regular rhythm. Normal distal pulses. Neurologic: Normal speech and language.  Skin:  Skin is warm, dry and intact. No rash noted. Skin tear laceration along the dorsal aspect of the right hand, approximately web of the thumb. Psychiatric:  Mood and affect are normal. Speech and behavior are normal. Patient exhibits appropriate insight and judgement.  ____________________________________________   LABS (all labs ordered are listed, but only abnormal results are displayed)  Labs Reviewed - No data to display ____________________________________________  EKG none ____________________________________________  RADIOLOGY none ____________________________________________   PROCEDURES  Procedure(s) performed: none   Critical Care performed: no ____________________________________________   INITIAL IMPRESSION / ASSESSMENT AND PLAN / ED COURSE  Pertinent labs & imaging results that were available during my care of the patient were reviewed by me and considered in my medical decision making (see  chart for details).  Patient presents to emergency department with skin tear laceration along the dorsal aspect of the right hand. History and physical exam findings are reassuring skin tear does not appear unstable, hemorrhages controlled and does not appear infected. Patient advised to change dressing daily and continue monitoring wound for signs of healing. If he notes developing infection do not hesitate to follow up with his primary care or return to the emergency department. Patient informed of clinical course, understand medical decision-making process, and agree with plan.  ____________________________________________   FINAL CLINICAL IMPRESSION(S) / ED DIAGNOSES  Final diagnoses:  Visit for wound check  Skin tear of right hand without complication, initial encounter       NEW MEDICATIONS STARTED DURING THIS VISIT:  Discharge Medication List as of 06/22/2017 10:51 AM       Note:  This document was prepared using Dragon voice recognition software and may include unintentional dictation errors.    Jerolyn Shin, PA-C 06/22/17 1346    Earleen Newport, MD 06/22/17 1356

## 2017-06-22 NOTE — ED Triage Notes (Signed)
Pt reports he had a band aid on his rt hand and when he removed it his skin tore with the band aid.

## 2017-06-22 NOTE — Discharge Instructions (Signed)
Keep wound clean and dry and covered for the next several days with nonadherent dressing and rolled gauze.   Monitor the wound daily for signs of healing. If you note signs of developing infection do not hesitate to follow up with your primary care doctor or return to the emergency department.

## 2017-06-24 DIAGNOSIS — E785 Hyperlipidemia, unspecified: Secondary | ICD-10-CM | POA: Diagnosis not present

## 2017-06-24 DIAGNOSIS — D509 Iron deficiency anemia, unspecified: Secondary | ICD-10-CM | POA: Diagnosis not present

## 2017-06-24 DIAGNOSIS — Z940 Kidney transplant status: Secondary | ICD-10-CM | POA: Diagnosis not present

## 2017-07-04 ENCOUNTER — Other Ambulatory Visit: Payer: Self-pay | Admitting: Interventional Cardiology

## 2017-07-04 MED ORDER — LOSARTAN POTASSIUM 25 MG PO TABS: 25.0000 mg | ORAL_TABLET | Freq: Every day | ORAL | 2 refills | Status: DC

## 2017-07-04 NOTE — Telephone Encounter (Signed)
Pt's medication was sent to pt's pharmacy as requested. Confirmation received.  °

## 2017-07-05 ENCOUNTER — Ambulatory Visit: Payer: Medicare Other

## 2017-07-05 ENCOUNTER — Ambulatory Visit: Payer: Medicare Other | Admitting: Internal Medicine

## 2017-07-20 ENCOUNTER — Ambulatory Visit (INDEPENDENT_AMBULATORY_CARE_PROVIDER_SITE_OTHER): Payer: Medicare Other | Admitting: Pharmacist Clinician (PhC)/ Clinical Pharmacy Specialist

## 2017-07-20 DIAGNOSIS — I10 Essential (primary) hypertension: Secondary | ICD-10-CM

## 2017-07-20 NOTE — Patient Instructions (Addendum)
Return for a a follow up appointment in 6 weeks  Your blood pressure today is 135/64   Check your blood pressure at home several times each week and keep record of the readings. Watch your heart rate readings as you check your home BP.  If the heart rate stays in the low 50's please give [place] a call.    Take your BP meds as follows:  Continue with all current medications  Bring all of your meds, your BP cuff and your record of home blood pressures to your next appointment.  Exercise as you're able, try to walk approximately 30 minutes per day.  Keep salt intake to a minimum, especially watch canned and prepared boxed foods.  Eat more fresh fruits and vegetables and fewer canned items.  Avoid eating in fast food restaurants.    HOW TO TAKE YOUR BLOOD PRESSURE: . Rest 5 minutes before taking your blood pressure. .  Don't smoke or drink caffeinated beverages for at least 30 minutes before. . Take your blood pressure before (not after) you eat. . Sit comfortably with your back supported and both feet on the floor (don't cross your legs). . Elevate your arm to heart level on a table or a desk. . Use the proper sized cuff. It should fit smoothly and snugly around your bare upper arm. There should be enough room to slip a fingertip under the cuff. The bottom edge of the cuff should be 1 inch above the crease of the elbow. . Ideally, take 3 measurements at one sitting and record the average.

## 2017-07-20 NOTE — Assessment & Plan Note (Addendum)
Patient with BP good today although not at the goal set by nephrology.  Also of note is that his heart rate has dropped to 53 in the office today, both by the electronic cuff and the pulse oximeter.   The lower heart rate may be contributing to his decreased energy levels.   I explained that we could cut back on the carvedilol and try bringing his heart rate up, but with only one reading I don't want to make any significant changes.  If we do need to decrease the carvedilol, would probably need to add some amlodipine or hydralazine to compensate and get pressure down.  He may need the additional medication anyway, but he is hesitant today to add to his medications.  We will have him come back in 6 weeks and asked that he continue with regular home BP checks, and he can call before then if he notes the heart rate does not go up.

## 2017-07-20 NOTE — Progress Notes (Signed)
Patient ID: [name]                 DOB: Apr 16, 1946                      MRN: [id number]     HPI: [name] is a 71 y.o. male referred by Dr. Tamala Julian to HTN clinic. 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 Pt had f/u with [place] on 04/28/17 and presented with trace pitting edema on both ankles and stated he had not taken his Lasix that morning. Dr. Marval Regal recommended increasing Lasix to 40mg  BID for several days, to restrict sodium and fluid, and f/u with Dr. Tamala Julian. Dr. 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  Pt was started on losartan 25mg  daily on Sept 5, then had a BMET drawn about 2 weeks later.  It showed a 17% increase in SCr (1.69 to 2.04), with a stable potassium.   Patient has been continued on losartan 25 mg and his carvedilol was increased to 12.5 mg bid.  He presents today for follow up.    Had to use the [employer] electronic BP cuff in the office as I was unable to hear his BP despite repeated attempts and with arm free of clothing.   Pt reports feeling well since last visit. He denies dizziness, blurred vision, headache, or falls. He has checked his BP at home and recalls readings in the 130s/70s. Did not bring any readings with him.  He does note that over the past month or so he has noticed that he gets tired more easily, stating that he cannot get through his rounds at [facility] without having to stop.  Patient states he has discussed this with Dr. Marval Regal and believes it is because of low hemoglobin.    Current HTN meds:  losartan 25mg  daily in am  carvedilol 12.5 mg BID   furosemide 40mg  daily in am  BP goal: <140/90 mmHg per Dr Tamala Julian, <130/80 per Dr. Marval Regal  Family  History: The patient's family history includes Heart attack in his father; Heart disease in his mother; Stroke in his sister.  Social History: Pt has never used tobacco. Denies any alcohol or illicit drug use.   Diet: Pt states that he tries to eat healthy throughout the day with veggies and chicken. Drinks plenty of water at home. Eats out some, but avoids fried foods and tries to make good choices. Has 16 oz. of coffee about 5 days a week. Trying to cut down on sodium intake.  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  Home BP readings - nothing > 545 systolic, occasionally < 625.   Diastolic mostly upper 63'S and 80's.  Labs:  06/08/17:  SCr 2.04, K 3.8 04/27/17: SCr 1.69, K 4.1 (prior to losartan start)  Wt Readings from Last 3 Encounters:  07/20/17 189 lb 8 oz (86 kg)  06/22/17 185 lb (83.9 kg)  06/08/17 185 lb 3 oz (84 kg)   BP Readings from Last 3 Encounters:  06/22/17 (!) 151/97  06/08/17 (!) 149/80  05/25/17 (!) 168/88   Pulse Readings from  Last 3 Encounters:  07/20/17 (!) 53  06/22/17 81  06/08/17 77    Renal function: CrCl cannot be calculated (Patient's most recent lab result is older than the maximum 21 days allowed.).  Past Medical History:  Diagnosis Date  . Atrial fibrillation ([place])   . Chronic kidney disease   . Diabetes mellitus   . Hyperlipidemia   . Neuropathy     Current Outpatient Prescriptions on File Prior to Visit  Medication Sig Dispense Refill  . acetaminophen (TYLENOL) 500 MG tablet Take 500 mg by mouth as needed (1-2x daily prn pain).    . Alcohol Swabs (B-D SINGLE USE SWABS REGULAR) PADS Use as directed    . aspirin 81 MG tablet Take 81 mg by mouth daily.      Marland Kitchen atorvastatin (LIPITOR) 10 MG tablet Take 10 mg by mouth daily.    Marland Kitchen BAYER CONTOUR NEXT TEST test strip Use as directed    . BD PEN NEEDLE NANO U/F 32G X 4  MM MISC Use as directed    . calcitRIOL (ROCALTROL) 0.25 MCG capsule Take 0.25 mcg by mouth 3 (three) times a week. (Mondays, Wednesdays, and Fridays)    . carvedilol (COREG) 12.5 MG tablet Take 1 tablet (12.5 mg total) by mouth 2 (two) times daily. 180 tablet 3  . diphenhydrAMINE (BENADRYL) 25 mg capsule Take 25 mg by mouth at bedtime as needed (for sleep/allergies.).     Marland Kitchen furosemide (LASIX) 40 MG tablet Take 1 tablet (40 mg total) by mouth daily. Fluid. 30 tablet   . gabapentin (NEURONTIN) 100 MG capsule Take 100 mg by mouth 3 (three) times daily as needed (for nerve pain). Take three times/day as needed    . glipiZIDE (GLUCOTROL) 10 MG tablet Take 10 mg by mouth 2 (two) times daily before a meal.      . LANTUS SOLOSTAR 100 UNIT/ML Solostar Pen Inject 6 units into the skin each morning and 8 units into the skin each evening.    . linagliptin (TRADJENTA) 5 MG TABS tablet Take 5 mg by mouth daily.      Marland Kitchen losartan (COZAAR) 25 MG tablet Take 1 tablet (25 mg total) by mouth daily. 90 tablet 2  . MICROLET LANCETS MISC Use as directed    . mycophenolate (CELLCEPT) 500 MG tablet Take 500 mg by mouth 2 (two) times daily.     . prednisoLONE 5 MG TABS Take 5 mg by mouth daily.      Marland Kitchen SANTYL ointment Reported on 02/09/2016    . tacrolimus (PROGRAF) 1 MG capsule Take 1 mg by mouth 2 (two) times daily.     Marland Kitchen VASCEPA 1 g CAPS Take 4 g by mouth daily.     No current facility-administered medications on file prior to visit.     Allergies  Allergen Reactions  . Elavil [Amitriptyline Hcl]     Unknown felt as if he was tripping   BP 135/64  HR 53, Ht 5'9, wt 189.5 lb  Assessment/Plan:  1. Hypertension - Patient with BP good today although not at the goal set by nephrology.  Also of note is that his heart rate has dropped to 53 in the office today, both by the electronic cuff and the pulse oximeter.   The lower heart rate may be contributing to his decreased energy levels.   I explained that we could cut  back on the carvedilol and try bringing his heart rate up, but with only one reading I don't  want to make any significant changes.  If we do need to decrease the carvedilol, would probably need to add some amlodipine or hydralazine to compensate and get pressure down.  He may need the additional medication anyway, but he is hesitant today to add to his medications.  We will have him come back in 6 weeks and asked that he continue with regular home BP checks, and he can call before then if he notes the heart rate does not go up.    Tommy Medal, PharmD, CPP, [facility] [address], [place], [place] [address] Phone: [phone]; Fax: [phone] 07/20/2017 10:29 AM

## 2017-07-21 DIAGNOSIS — I10 Essential (primary) hypertension: Secondary | ICD-10-CM | POA: Diagnosis not present

## 2017-07-21 DIAGNOSIS — E1165 Type 2 diabetes mellitus with hyperglycemia: Secondary | ICD-10-CM | POA: Diagnosis not present

## 2017-07-21 DIAGNOSIS — G609 Hereditary and idiopathic neuropathy, unspecified: Secondary | ICD-10-CM | POA: Diagnosis not present

## 2017-07-21 DIAGNOSIS — Z940 Kidney transplant status: Secondary | ICD-10-CM | POA: Diagnosis not present

## 2017-07-21 DIAGNOSIS — E7800 Pure hypercholesterolemia, unspecified: Secondary | ICD-10-CM | POA: Diagnosis not present

## 2017-07-27 DIAGNOSIS — H04123 Dry eye syndrome of bilateral lacrimal glands: Secondary | ICD-10-CM | POA: Diagnosis not present

## 2017-07-27 DIAGNOSIS — H401132 Primary open-angle glaucoma, bilateral, moderate stage: Secondary | ICD-10-CM | POA: Diagnosis not present

## 2017-07-27 DIAGNOSIS — H10413 Chronic giant papillary conjunctivitis, bilateral: Secondary | ICD-10-CM | POA: Diagnosis not present

## 2017-07-27 DIAGNOSIS — E119 Type 2 diabetes mellitus without complications: Secondary | ICD-10-CM | POA: Diagnosis not present

## 2017-07-27 DIAGNOSIS — H25813 Combined forms of age-related cataract, bilateral: Secondary | ICD-10-CM | POA: Diagnosis not present

## 2017-08-23 DIAGNOSIS — E1129 Type 2 diabetes mellitus with other diabetic kidney complication: Secondary | ICD-10-CM | POA: Diagnosis not present

## 2017-08-23 DIAGNOSIS — D509 Iron deficiency anemia, unspecified: Secondary | ICD-10-CM | POA: Diagnosis not present

## 2017-08-23 DIAGNOSIS — E785 Hyperlipidemia, unspecified: Secondary | ICD-10-CM | POA: Diagnosis not present

## 2017-08-23 DIAGNOSIS — N2581 Secondary hyperparathyroidism of renal origin: Secondary | ICD-10-CM | POA: Diagnosis not present

## 2017-08-23 DIAGNOSIS — Z940 Kidney transplant status: Secondary | ICD-10-CM | POA: Diagnosis not present

## 2017-08-24 DIAGNOSIS — N2581 Secondary hyperparathyroidism of renal origin: Secondary | ICD-10-CM | POA: Diagnosis not present

## 2017-08-24 DIAGNOSIS — Z23 Encounter for immunization: Secondary | ICD-10-CM | POA: Diagnosis not present

## 2017-08-24 DIAGNOSIS — I129 Hypertensive chronic kidney disease with stage 1 through stage 4 chronic kidney disease, or unspecified chronic kidney disease: Secondary | ICD-10-CM | POA: Diagnosis not present

## 2017-08-24 DIAGNOSIS — E118 Type 2 diabetes mellitus with unspecified complications: Secondary | ICD-10-CM | POA: Diagnosis not present

## 2017-08-24 DIAGNOSIS — Z940 Kidney transplant status: Secondary | ICD-10-CM | POA: Diagnosis not present

## 2017-08-24 DIAGNOSIS — I739 Peripheral vascular disease, unspecified: Secondary | ICD-10-CM | POA: Diagnosis not present

## 2017-08-24 DIAGNOSIS — Z6829 Body mass index (BMI) 29.0-29.9, adult: Secondary | ICD-10-CM | POA: Diagnosis not present

## 2017-08-24 DIAGNOSIS — R0601 Orthopnea: Secondary | ICD-10-CM | POA: Diagnosis not present

## 2017-08-24 DIAGNOSIS — E1122 Type 2 diabetes mellitus with diabetic chronic kidney disease: Secondary | ICD-10-CM | POA: Diagnosis not present

## 2017-08-24 DIAGNOSIS — D509 Iron deficiency anemia, unspecified: Secondary | ICD-10-CM | POA: Diagnosis not present

## 2017-08-24 DIAGNOSIS — E669 Obesity, unspecified: Secondary | ICD-10-CM | POA: Diagnosis not present

## 2017-08-24 DIAGNOSIS — E785 Hyperlipidemia, unspecified: Secondary | ICD-10-CM | POA: Diagnosis not present

## 2017-08-30 ENCOUNTER — Encounter (HOSPITAL_COMMUNITY)
Admission: RE | Admit: 2017-08-30 | Discharge: 2017-08-30 | Disposition: A | Discharge status: Home / Self Care | Payer: Medicare Other | Source: Ambulatory Visit | Attending: Nephrology | Admitting: Nephrology

## 2017-08-30 ENCOUNTER — Encounter (HOSPITAL_COMMUNITY): Payer: Self-pay

## 2017-08-30 DIAGNOSIS — D631 Anemia in chronic kidney disease: Secondary | ICD-10-CM | POA: Diagnosis not present

## 2017-08-30 DIAGNOSIS — N186 End stage renal disease: Secondary | ICD-10-CM | POA: Insufficient documentation

## 2017-08-30 LAB — HEMOGLOBIN: HEMOGLOBIN: 8.7 g/dL — AB (ref 13.0–17.0)

## 2017-08-30 MED ORDER — DARBEPOETIN ALFA 60 MCG/0.3ML IJ SOSY
60.0000 ug | PREFILLED_SYRINGE | INTRAMUSCULAR | Status: DC
  Administered 2017-08-30: 60 ug via SUBCUTANEOUS
  Filled 2017-08-30: qty 0.3

## 2017-08-30 MED ORDER — SODIUM CHLORIDE 0.9 % IV SOLN
INTRAVENOUS | Status: DC
  Administered 2017-08-30: 13:00:00 via INTRAVENOUS

## 2017-08-30 MED ORDER — SODIUM CHLORIDE 0.9 % IV SOLN
510.0000 mg | INTRAVENOUS | Status: DC
  Administered 2017-08-30: 510 mg via INTRAVENOUS
  Filled 2017-08-30: qty 17

## 2017-08-30 NOTE — Discharge Instructions (Signed)
Ferumoxytol injection °What is this medicine? °FERUMOXYTOL is an iron complex. Iron is used to make healthy red blood cells, which carry oxygen and nutrients throughout the body. This medicine is used to treat iron deficiency anemia in people with chronic kidney disease. °This medicine may be used for other purposes; ask your health care provider or pharmacist if you have questions. °COMMON BRAND NAME(S): Feraheme °What should I tell my health care provider before I take this medicine? °They need to know if you have any of these conditions: °-anemia not caused by low iron levels °-high levels of iron in the blood °-magnetic resonance imaging (MRI) test scheduled °-an unusual or allergic reaction to iron, other medicines, foods, dyes, or preservatives °-pregnant or trying to get pregnant °-breast-feeding °How should I use this medicine? °This medicine is for injection into a vein. It is given by a health care professional in a hospital or clinic setting. °Talk to your pediatrician regarding the use of this medicine in children. Special care may be needed. °Overdosage: If you think you have taken too much of this medicine contact a poison control center or emergency room at once. °NOTE: This medicine is only for you. Do not share this medicine with others. °What if I miss a dose? °It is important not to miss your dose. Call your doctor or health care professional if you are unable to keep an appointment. °What may interact with this medicine? °This medicine may interact with the following medications: °-other iron products °This list may not describe all possible interactions. Give your health care provider a list of all the medicines, herbs, non-prescription drugs, or dietary supplements you use. Also tell them if you smoke, drink alcohol, or use illegal drugs. Some items may interact with your medicine. °What should I watch for while using this medicine? °Visit your doctor or healthcare professional regularly. Tell  your doctor or healthcare professional if your symptoms do not start to get better or if they get worse. You may need blood work done while you are taking this medicine. °You may need to follow a special diet. Talk to your doctor. Foods that contain iron include: whole grains/cereals, dried fruits, beans, or peas, leafy green vegetables, and organ meats (liver, kidney). °What side effects may I notice from receiving this medicine? °Side effects that you should report to your doctor or health care professional as soon as possible: °-allergic reactions like skin rash, itching or hives, swelling of the face, lips, or tongue °-breathing problems °-changes in blood pressure °-feeling faint or lightheaded, falls °-fever or chills °-flushing, sweating, or hot feelings °-swelling of the ankles or feet °Side effects that usually do not require medical attention (report to your doctor or health care professional if they continue or are bothersome): °-diarrhea °-headache °-nausea, vomiting °-stomach pain °This list may not describe all possible side effects. Call your doctor for medical advice about side effects. You may report side effects to FDA at 1-800-FDA-1088. °Where should I keep my medicine? °This drug is given in a hospital or clinic and will not be stored at home. °NOTE: This sheet is a summary. It may not cover all possible information. If you have questions about this medicine, talk to your doctor, pharmacist, or health care provider. °© 2018 Elsevier/Gold Standard (2015-10-09 12:41:49) °Darbepoetin Alfa injection °What is this medicine? °DARBEPOETIN ALFA (dar be POE e tin AL fa) helps your body make more red blood cells. It is used to treat anemia caused by chronic kidney failure and   chemotherapy. °This medicine may be used for other purposes; ask your health care provider or pharmacist if you have questions. °COMMON BRAND NAME(S): Aranesp °What should I tell my health care provider before I take this  medicine? °They need to know if you have any of these conditions: °-blood clotting disorders or history of blood clots °-cancer patient not on chemotherapy °-cystic fibrosis °-heart disease, such as angina, heart failure, or a history of a heart attack °-hemoglobin level of 12 g/dL or greater °-high blood pressure °-low levels of folate, iron, or vitamin B12 °-seizures °-an unusual or allergic reaction to darbepoetin, erythropoietin, albumin, hamster proteins, latex, other medicines, foods, dyes, or preservatives °-pregnant or trying to get pregnant °-breast-feeding °How should I use this medicine? °This medicine is for injection into a vein or under the skin. It is usually given by a health care professional in a hospital or clinic setting. °If you get this medicine at home, you will be taught how to prepare and give this medicine. Use exactly as directed. Take your medicine at regular intervals. Do not take your medicine more often than directed. °It is important that you put your used needles and syringes in a special sharps container. Do not put them in a trash can. If you do not have a sharps container, call your pharmacist or healthcare provider to get one. °A special MedGuide will be given to you by the pharmacist with each prescription and refill. Be sure to read this information carefully each time. °Talk to your pediatrician regarding the use of this medicine in children. While this medicine may be used in children as young as 1 year for selected conditions, precautions do apply. °Overdosage: If you think you have taken too much of this medicine contact a poison control center or emergency room at once. °NOTE: This medicine is only for you. Do not share this medicine with others. °What if I miss a dose? °If you miss a dose, take it as soon as you can. If it is almost time for your next dose, take only that dose. Do not take double or extra doses. °What may interact with this medicine? °Do not take this  medicine with any of the following medications: °-epoetin alfa °This list may not describe all possible interactions. Give your health care provider a list of all the medicines, herbs, non-prescription drugs, or dietary supplements you use. Also tell them if you smoke, drink alcohol, or use illegal drugs. Some items may interact with your medicine. °What should I watch for while using this medicine? °Your condition will be monitored carefully while you are receiving this medicine. °You may need blood work done while you are taking this medicine. °What side effects may I notice from receiving this medicine? °Side effects that you should report to your doctor or health care professional as soon as possible: °-allergic reactions like skin rash, itching or hives, swelling of the face, lips, or tongue °-breathing problems °-changes in vision °-chest pain °-confusion, trouble speaking or understanding °-feeling faint or lightheaded, falls °-high blood pressure °-muscle aches or pains °-pain, swelling, warmth in the leg °-rapid weight gain °-severe headaches °-sudden numbness or weakness of the face, arm or leg °-trouble walking, dizziness, loss of balance or coordination °-seizures (convulsions) °-swelling of the ankles, feet, hands °-unusually weak or tired °Side effects that usually do not require medical attention (report to your doctor or health care professional if they continue or are bothersome): °-diarrhea °-fever, chills (flu-like symptoms) °-headaches °-nausea, vomiting °-  redness, stinging, or swelling at site where injected °This list may not describe all possible side effects. Call your doctor for medical advice about side effects. You may report side effects to FDA at 1-800-FDA-1088. °Where should I keep my medicine? °Keep out of the reach of children. °Store in a refrigerator between 2 and 8 degrees C (36 and 46 degrees F). Do not freeze. Do not shake. Throw away any unused portion if using a single-dose  vial. Throw away any unused medicine after the expiration date. °NOTE: This sheet is a summary. It may not cover all possible information. If you have questions about this medicine, talk to your doctor, pharmacist, or health care provider. °© 2018 Elsevier/Gold Standard (2016-04-26 19:52:26) ° °

## 2017-08-30 NOTE — Progress Notes (Signed)
Received first feraheme infusion today.  Tolerated well.  2nd dose due on 09/06/17 at 1330.  Pt has copy of this appointment.  hgb is 8.7 today, so aranesp will be given also today.  Next appointment for aranesp is 09/27/16 at 1pm.  Pt has this appointment.  D/c instructions on feraheme and aranesp given to pt.

## 2017-08-31 ENCOUNTER — Ambulatory Visit: Payer: Medicare Other | Admitting: Pharmacist

## 2017-08-31 NOTE — Progress Notes (Deleted)
Patient ID: [name]                 DOB: 1946/02/11                      MRN: [id number]     HPI: [name] is a 71 y.o. male referred by Dr. Tamala Julian to HTN clinic. 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 Pt had f/u with [place] on 04/28/17 and presented with trace pitting edema on both ankles and stated he had not taken his Lasix that morning. Dr. Marval Regal recommended increasing Lasix to 40mg  BID for several days, to restrict sodium and fluid, and f/u with Dr. Tamala Julian.Dr. 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  Pt was started on losartan 25mg  daily on Sept 5, then had a BMET drawn about 2 weeks later.  It showed a 17% increase in SCr (1.69 to 2.04), with a stable potassium.   Patient has been continued on losartan 25 mg and his carvedilol was increased to 12.5 mg bid.  He presents today for follow up.     electronic BP cuff in the office as I was unable to hear his BP despite repeated attempts and with arm free of clothing.  Amlodipine if needed  Pt reports feeling well since last visit. He denies dizziness, blurred vision, headache, or falls. He has checked his BP at home and recalls readings in the 130s/70s. Did not bring any readings with him.  He does note that over the past month or so he has noticed that he gets tired more easily, stating that he cannot get through his rounds at [name] without having to stop.  Patient states he has discussed this with Dr. Marval Regal and believes it is because of low hemoglobin.    Current HTN meds: losartan 25mg  daily (AM), carvedilol 12.5 mg BID, furosemide 40mg  daily (AM)  BP goal: <140/90 mmHg per Dr Tamala Julian, <130/80 per Dr. Marval Regal  Family History:  The patient's family history includes Heart attack in his father; Heart disease in his mother; Stroke in his sister.  Social History: Pt has never used tobacco. Denies any alcohol or illicit drug use.   Diet: Pt states that he tries to eat healthy throughout the day with veggies and chicken. Drinks plenty of water at home. Eats out some, but avoids fried foods and tries to make good choices. Has 16 oz.of coffee about 5 days a week. Trying to cut down on sodium intake.  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  Home BP readings - nothing > 295 systolic, occasionally < 188.   Diastolic mostly upper 41'Y and 80's.  Labs:  06/08/17:  SCr 2.04, K 3.8 04/27/17: SCr 1.69, K 4.1 (prior to losartan start)  Wt Readings from Last 3 Encounters:  08/30/17 187 lb (84.8 kg)  07/20/17 189 lb 8 oz (86 kg)  06/22/17 185 lb (83.9 kg)   BP Readings from Last 3 Encounters:  08/30/17 134/73  06/22/17 (!) 151/97  06/08/17 (!) 149/80   Pulse Readings from Last 3 Encounters:  08/30/17 69  07/20/17 (!) 53  06/22/17 81  Renal function: CrCl cannot be calculated (Patient's most recent lab result is older than the maximum 21 days allowed.).  Past Medical History:  Diagnosis Date  . Atrial fibrillation ([employer])   . Chronic kidney disease   . Diabetes mellitus   . Hyperlipidemia   . Neuropathy     Current Outpatient Medications on File Prior to Visit  Medication Sig Dispense Refill  . acetaminophen (TYLENOL) 500 MG tablet Take 500 mg by mouth as needed (1-2x daily prn pain).    . Alcohol Swabs (B-D SINGLE USE SWABS REGULAR) PADS Use as directed    . aspirin 81 MG tablet Take 81 mg by mouth daily.      Marland Kitchen atorvastatin (LIPITOR) 10 MG tablet Take 10 mg by mouth daily.    Marland Kitchen BAYER CONTOUR NEXT TEST test strip Use as directed    . BD PEN NEEDLE NANO U/F 32G X 4 MM MISC Use as  directed    . calcitRIOL (ROCALTROL) 0.25 MCG capsule Take 0.25 mcg by mouth 3 (three) times a week. (Mondays, Wednesdays, and Fridays)    . carvedilol (COREG) 12.5 MG tablet Take 1 tablet (12.5 mg total) by mouth 2 (two) times daily. 180 tablet 3  . diphenhydrAMINE (BENADRYL) 25 mg capsule Take 25 mg by mouth at bedtime as needed (for sleep/allergies.).     Marland Kitchen furosemide (LASIX) 40 MG tablet Take 1 tablet (40 mg total) by mouth daily. Fluid. 30 tablet   . gabapentin (NEURONTIN) 100 MG capsule Take 100 mg by mouth 3 (three) times daily as needed (for nerve pain). Take three times/day as needed    . glipiZIDE (GLUCOTROL) 10 MG tablet Take 10 mg by mouth 2 (two) times daily before a meal.      . LANTUS SOLOSTAR 100 UNIT/ML Solostar Pen Inject 6 units into the skin each morning and 8 units into the skin each evening.    . linagliptin (TRADJENTA) 5 MG TABS tablet Take 5 mg by mouth daily.      Marland Kitchen losartan (COZAAR) 25 MG tablet Take 1 tablet (25 mg total) by mouth daily. 90 tablet 2  . MICROLET LANCETS MISC Use as directed    . mycophenolate (CELLCEPT) 500 MG tablet Take 500 mg by mouth 2 (two) times daily.     . prednisoLONE 5 MG TABS Take 5 mg by mouth daily.      Marland Kitchen SANTYL ointment Reported on 02/09/2016    . tacrolimus (PROGRAF) 1 MG capsule Take 1 mg by mouth 2 (two) times daily.     Marland Kitchen VASCEPA 1 g CAPS Take 4 g by mouth daily.     No current facility-administered medications on file prior to visit.     Allergies  Allergen Reactions  . Elavil [Amitriptyline Hcl]     Unknown felt as if he was tripping     Assessment/Plan:  1. Hypertension -

## 2017-09-05 ENCOUNTER — Inpatient Hospital Stay (HOSPITAL_COMMUNITY): Admission: RE | Admit: 2017-09-05 | Payer: Medicare Other | Source: Ambulatory Visit

## 2017-09-06 ENCOUNTER — Encounter (HOSPITAL_COMMUNITY)
Admission: RE | Admit: 2017-09-06 | Discharge: 2017-09-06 | Disposition: A | Discharge status: Home / Self Care | Payer: Medicare Other | Source: Ambulatory Visit | Attending: Nephrology | Admitting: Nephrology

## 2017-09-06 DIAGNOSIS — N186 End stage renal disease: Secondary | ICD-10-CM | POA: Diagnosis not present

## 2017-09-06 DIAGNOSIS — D631 Anemia in chronic kidney disease: Secondary | ICD-10-CM | POA: Diagnosis not present

## 2017-09-06 MED ORDER — SODIUM CHLORIDE 0.9 % IV SOLN
INTRAVENOUS | Status: DC
  Administered 2017-09-06: 14:00:00 via INTRAVENOUS

## 2017-09-06 MED ORDER — SODIUM CHLORIDE 0.9 % IV SOLN
510.0000 mg | INTRAVENOUS | Status: AC
  Administered 2017-09-06: 510 mg via INTRAVENOUS
  Filled 2017-09-06: qty 17

## 2017-09-27 ENCOUNTER — Encounter (HOSPITAL_COMMUNITY): Payer: Self-pay

## 2017-09-27 ENCOUNTER — Encounter (HOSPITAL_COMMUNITY)
Admission: RE | Admit: 2017-09-27 | Discharge: 2017-09-27 | Disposition: A | Discharge status: Home / Self Care | Payer: Medicare Other | Source: Ambulatory Visit | Attending: Nephrology | Admitting: Nephrology

## 2017-09-27 DIAGNOSIS — N186 End stage renal disease: Secondary | ICD-10-CM | POA: Diagnosis not present

## 2017-09-27 DIAGNOSIS — D631 Anemia in chronic kidney disease: Secondary | ICD-10-CM | POA: Diagnosis not present

## 2017-09-27 HISTORY — DX: Anemia, unspecified: D64.9

## 2017-09-27 HISTORY — DX: Cardiac arrhythmia, unspecified: I49.9

## 2017-09-27 LAB — HEMOGLOBIN: Hemoglobin: 9 g/dL — ABNORMAL LOW (ref 13.0–17.0)

## 2017-09-27 MED ORDER — DARBEPOETIN ALFA 60 MCG/0.3ML IJ SOSY
60.0000 ug | PREFILLED_SYRINGE | INTRAMUSCULAR | Status: DC
  Administered 2017-09-27: 60 ug via SUBCUTANEOUS
  Filled 2017-09-27: qty 0.3

## 2017-09-29 ENCOUNTER — Ambulatory Visit (INDEPENDENT_AMBULATORY_CARE_PROVIDER_SITE_OTHER): Payer: Medicare Other | Admitting: Pharmacist

## 2017-09-29 VITALS — BP 128/54 | HR 42

## 2017-09-29 DIAGNOSIS — I10 Essential (primary) hypertension: Secondary | ICD-10-CM | POA: Diagnosis not present

## 2017-09-29 MED ORDER — CARVEDILOL 6.25 MG PO TABS: 6.2500 mg | ORAL_TABLET | Freq: Two times a day (BID) | ORAL | 3 refills | Status: DC

## 2017-09-29 NOTE — Progress Notes (Signed)
Patient ID: [name]                 DOB: 04-17-1946                      MRN: [id number]     HPI: [name] is a 72 y.o. male referred by Dr. Tamala Julian to HTN clinic. 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 Pt had f/u with [place] on 04/28/17 and presented with trace pitting edema on both ankles and stated he had not taken his Lasix that morning. Dr. Marval Regal recommended increasing Lasix to 40mg  BID for several days, to restrict sodium and fluid, and f/u with Dr. Tamala Julian.Dr. 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  Pt was started on losartan 25mg  daily on Sept 5, then had a BMET drawn about 2 weeks later.  It showed a 17% increase in SCr (1.69 to 2.04), with a stable potassium.   Patient has been continued on losartan 25 mg and his carvedilol was increased to 12.5 mg bid.  He presents today for follow up.  Pt presents today in good spirits. He denies dizziness, falls, blurred vision, or headaches. He had a cold recently and took plain Mucinex for this. His BP has been checked recently at other office visits: 134/73, 141/60, with HR 70-80. BP is improved today at 128/54, however HR is much lower at 42, checked multiple times. Pt has not been keeping an eye on this at home but does report a decrease in his energy (has also had low Hgb which may be contributing). He took his BP medications this morning except for his Lasix since he was going to be out of his home. He drank 1 cup of coffee this morning. He uses a pill box to organize his medications and has solid medication recall regarding his BP regimen.  Current HTN meds: losartan 25mg  daily (AM), carvedilol 12.5 mg BID, furosemide 40mg  daily (AM)  BP goal:  <140/90 mmHg per Dr Tamala Julian, <130/80 per Dr. Marval Regal  Family History: The patient's family history includes Heart attack in his father; Heart disease in his mother; Stroke in his sister.  Social History: Pt has never used tobacco. Denies any alcohol or illicit drug use.   Diet: Pt states that he tries to eat healthy throughout the day with veggies and chicken. Drinks plenty of water at home. Eats out some, but avoids fried foods and tries to make good choices. Has 16 oz.of coffee about 5 days a week. Trying to cut down on sodium intake.  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  Home BP readings - nothing > 527 systolic, occasionally < 782.   Diastolic mostly upper 42'P and 80's.  Labs:  06/08/17:  SCr 2.04, K 3.8 04/27/17: SCr 1.69, K 4.1 (prior to losartan start)  Wt Readings from Last 3 Encounters:  09/27/17 183 lb 4 oz (83.1 kg)  09/06/17 182 lb 9.6 oz (82.8 kg)  08/30/17 187 lb (84.8 kg)   BP Readings from Last 3 Encounters:  09/27/17 (!) 143/73  09/06/17 (!) 141/60  08/30/17 134/73   Pulse Readings from  Last 3 Encounters:  09/27/17 74  09/06/17 82  08/30/17 69    Renal function: CrCl cannot be calculated (Patient's most recent lab result is older than the maximum 21 days allowed.).  Past Medical History:  Diagnosis Date  . Anemia   . Atrial fibrillation ([place])   . Chronic kidney disease   . Diabetes mellitus   . Dysrhythmia   . Hyperlipidemia   . Neuropathy     Current Outpatient Medications on File Prior to Visit  Medication Sig Dispense Refill  . acetaminophen (TYLENOL) 500 MG tablet Take 500 mg by mouth as needed (1-2x daily prn pain).    . Alcohol Swabs (B-D SINGLE USE SWABS REGULAR) PADS Use as directed    . aspirin 81 MG tablet Take 81 mg by mouth daily.      Marland Kitchen atorvastatin (LIPITOR) 10 MG tablet Take 10 mg by mouth daily.     Marland Kitchen BAYER CONTOUR NEXT TEST test strip Use as directed    . BD PEN NEEDLE NANO U/F 32G X 4 MM MISC Use as directed    . calcitRIOL (ROCALTROL) 0.25 MCG capsule Take 0.25 mcg by mouth 3 (three) times a week. (Mondays, Wednesdays, and Fridays)    . carvedilol (COREG) 12.5 MG tablet Take 1 tablet (12.5 mg total) by mouth 2 (two) times daily. 180 tablet 3  . diphenhydrAMINE (BENADRYL) 25 mg capsule Take 25 mg by mouth at bedtime as needed (for sleep/allergies.).     Marland Kitchen furosemide (LASIX) 40 MG tablet Take 1 tablet (40 mg total) by mouth daily. Fluid. 30 tablet   . gabapentin (NEURONTIN) 100 MG capsule Take 100 mg by mouth 3 (three) times daily as needed (for nerve pain). Take three times/day as needed    . glipiZIDE (GLUCOTROL) 10 MG tablet Take 10 mg by mouth 2 (two) times daily before a meal.      . LANTUS SOLOSTAR 100 UNIT/ML Solostar Pen Inject 6 units into the skin each morning and 8 units into the skin each evening.    . linagliptin (TRADJENTA) 5 MG TABS tablet Take 5 mg by mouth daily.      Marland Kitchen losartan (COZAAR) 25 MG tablet Take 1 tablet (25 mg total) by mouth daily. 90 tablet 2  . MICROLET LANCETS MISC Use as directed    . mycophenolate (CELLCEPT) 500 MG tablet Take 500 mg by mouth 2 (two) times daily.     . prednisoLONE 5 MG TABS Take 5 mg by mouth daily.      Marland Kitchen SANTYL ointment Reported on 02/09/2016    . tacrolimus (PROGRAF) 1 MG capsule Take 1 mg by mouth 2 (two) times daily.     Marland Kitchen VASCEPA 1 g CAPS Take 4 g by mouth daily.     No current facility-administered medications on file prior to visit.     Allergies  Allergen Reactions  . Elavil [Amitriptyline Hcl]     Unknown felt as if he was tripping     Assessment/Plan:  1. Hypertension - BP is at goal <130/23mmHg, however pt is bradycardic with HR in the 40s. Will decrease carvedilol dose to 6.25mg  BID and continue losartan 25mg  daily and furosemide 40mg  daily. F/u in HTN clinic in 3 weeks. If BP becomes too elevated, would consider  starting amlodipine ore rechecking BMET to ensure stable SCr prior to any losartan dose adjustments.   Ceaira Ernster E. Roneisha Stern, PharmD, CPP, [place] [address], [place], [place] [address] Phone: [phone])  [phone]; Fax: [phone] 09/29/2017 2:35 PM

## 2017-09-29 NOTE — Patient Instructions (Signed)
It was nice to see you today  Your blood pressure was excellent, but your heart rate was lower than we would like it to be  Decrease your carvedilol dose to 6.25mg  twice daily. You can cut your 12.5mg  tablet in half and take 1/2 tablet twice a day. I sent a refill to your mail order pharmacy for the lower 6.25mg  dose so that you can start taking 1 tablet twice daily once it's delivered.  Continue taking your other medications  Follow up in blood pressure clinic in 3-4 weeks

## 2017-10-19 NOTE — Progress Notes (Signed)
Patient ID: [name]                 DOB: 1946-07-01                      MRN: [id number]     HPI: [name] is a 72 y.o. male referred by Dr. Tamala Julian to HTN clinic. PMH is significant for atrial fibrillation, ESRD s/p 2 kidney transplants, diabetes mellitus, hyperlipidemia, neuropathy, and diastolic heart failure (ECHO revealed LVH and EF of 50-55%). Pt had f/u with [facility] on 04/28/17 and presented with trace pitting edema in both ankles and stated he had not taken his Lasix that morning. Dr. Marval Regal recommended increasing Lasix to 40mg  BID for several days, to restrict sodium and fluid, and f/u with Dr. Tamala Julian.Dr. 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  Pt was started on losartan 25mg  daily on Sept 5, then had a BMET drawn about 2 weeks later.  It showed a 17% increase in SCr (1.69 to 2.04), with a stable potassium. Patient has been continued on losartan 25 mg and his carvedilol was increased to 12.5mg  BID, however pt became bradycardic in the 40s and his dose was again reduced to 6.25mg  BID.  Pt presents today in good spirits. He denies dizziness, blurred vision, and headaches. He has had some pain in his right arm but only when he turns it in a certain direction. He has been feeling better since his hemoglobin has improved to 9. His BP at home has been ~130/80 with a high of 140/85. BP is difficult to hear manually in clinic, automatic cuff read error. Ultimately BP using patient's forearm. He uses a pill box to organize his medications and has good medication recall regarding his BP regimen.  Current HTN meds: losartan 25mg  daily (AM), carvedilol 6.25 mg BID, furosemide 40mg  daily (AM) Previously tried: carvedilol 12.5mg  BID - bradycardic with HR in the low 40s BP goal: <140/90 mmHg per Dr Tamala Julian, <130/80 per Dr. Marval Regal  Family History: The patient's family  history includes Heart attack in his father; Heart disease in his mother; Stroke in his sister.  Social History: Pt has never used tobacco. Denies any alcohol or illicit drug use.   Diet: Pt states that he tries to eat healthy throughout the day with veggies and chicken. Drinks plenty of water at home. Eats out some, but avoids fried foods and tries to make good choices. Has 16 oz.of coffee about 5 days a week. Trying to cut down on sodium intake.  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  Home BP readings - nothing > 254 systolic, occasionally < 270.   Diastolic mostly upper 62'B and 80's.  Labs:  06/08/17:  SCr 2.04, K 3.8 04/27/17: SCr 1.69, K 4.1 (prior to losartan start)  Wt Readings from Last 3 Encounters:  09/27/17 183 lb 4 oz (83.1 kg)  09/06/17 182 lb 9.6 oz (82.8 kg)  08/30/17 187 lb (84.8 kg)   BP Readings from Last 3 Encounters:  09/29/17 (!) 128/54  09/27/17 (!) 143/73  09/06/17 (!) 141/60   Pulse Readings from Last 3 Encounters:  09/29/17 (!) 42  09/27/17 74  09/06/17 82    Renal function: CrCl cannot be calculated (Patient's  most recent lab result is older than the maximum 21 days allowed.).  Past Medical History:  Diagnosis Date  . Anemia   . Atrial fibrillation ([place])   . Chronic kidney disease   . Diabetes mellitus   . Dysrhythmia   . Hyperlipidemia   . Neuropathy     Current Outpatient Medications on File Prior to Visit  Medication Sig Dispense Refill  . acetaminophen (TYLENOL) 500 MG tablet Take 500 mg by mouth as needed (1-2x daily prn pain).    . Alcohol Swabs (B-D SINGLE USE SWABS REGULAR) PADS Use as directed    . aspirin 81 MG tablet Take 81 mg by mouth daily.      Marland Kitchen atorvastatin (LIPITOR) 10 MG tablet Take 10 mg by mouth daily.    Marland Kitchen BAYER CONTOUR NEXT TEST test strip Use as directed    . BD PEN NEEDLE NANO U/F  32G X 4 MM MISC Use as directed    . calcitRIOL (ROCALTROL) 0.25 MCG capsule Take 0.25 mcg by mouth 3 (three) times a week. (Mondays, Wednesdays, and Fridays)    . carvedilol (COREG) 6.25 MG tablet Take 1 tablet (6.25 mg total) by mouth 2 (two) times daily. 180 tablet 3  . diphenhydrAMINE (BENADRYL) 25 mg capsule Take 25 mg by mouth at bedtime as needed (for sleep/allergies.).     Marland Kitchen furosemide (LASIX) 40 MG tablet Take 1 tablet (40 mg total) by mouth daily. Fluid. 30 tablet   . gabapentin (NEURONTIN) 100 MG capsule Take 100 mg by mouth 3 (three) times daily as needed (for nerve pain). Take three times/day as needed    . glipiZIDE (GLUCOTROL) 10 MG tablet Take 10 mg by mouth 2 (two) times daily before a meal.      . LANTUS SOLOSTAR 100 UNIT/ML Solostar Pen Inject 6 units into the skin each morning and 8 units into the skin each evening.    . linagliptin (TRADJENTA) 5 MG TABS tablet Take 5 mg by mouth daily.      Marland Kitchen losartan (COZAAR) 25 MG tablet Take 1 tablet (25 mg total) by mouth daily. 90 tablet 2  . MICROLET LANCETS MISC Use as directed    . mycophenolate (CELLCEPT) 500 MG tablet Take 500 mg by mouth 2 (two) times daily.     . prednisoLONE 5 MG TABS Take 5 mg by mouth daily.      Marland Kitchen SANTYL ointment Reported on 02/09/2016    . tacrolimus (PROGRAF) 1 MG capsule Take 1 mg by mouth 2 (two) times daily.     Marland Kitchen VASCEPA 1 g CAPS Take 4 g by mouth daily.     No current facility-administered medications on file prior to visit.     Allergies  Allergen Reactions  . Elavil [Amitriptyline Hcl]     Unknown felt as if he was tripping     Assessment/Plan:  1. Hypertension - BP is at goal <130/62mmHg and HR has improved to the 60s since decreasing carvedilol dose. Will continue carvedilol 6.25mg  BID, losartan 25mg  daily and furosemide 40mg  daily. Advised pt to contact clinic if BP becomes elevated > 150/42mmHg consistently. F/u in HTN clinic as needed.   Quinnie Barcelo E. Supple, PharmD, CPP, [facility] [address] [place], [place] [address] Phone: [phone]; Fax: [phone] 10/19/2017 2:41 PM

## 2017-10-20 ENCOUNTER — Ambulatory Visit (INDEPENDENT_AMBULATORY_CARE_PROVIDER_SITE_OTHER): Payer: Medicare Other | Admitting: Pharmacist

## 2017-10-20 VITALS — BP 124/72 | HR 65

## 2017-10-20 DIAGNOSIS — I10 Essential (primary) hypertension: Secondary | ICD-10-CM

## 2017-10-20 NOTE — Patient Instructions (Signed)
It was nice to see you today  Continue taking your current medications as you have been  Continue to check your blood pressure at home - call clinic if your readings stay elevated above 150/90

## 2017-10-26 ENCOUNTER — Encounter (HOSPITAL_COMMUNITY)
Admission: RE | Admit: 2017-10-26 | Discharge: 2017-10-26 | Disposition: A | Discharge status: Home / Self Care | Payer: Medicare Other | Source: Ambulatory Visit | Attending: Nephrology | Admitting: Nephrology

## 2017-10-26 ENCOUNTER — Encounter (HOSPITAL_COMMUNITY): Payer: Self-pay

## 2017-10-26 DIAGNOSIS — D631 Anemia in chronic kidney disease: Secondary | ICD-10-CM | POA: Insufficient documentation

## 2017-10-26 DIAGNOSIS — N186 End stage renal disease: Secondary | ICD-10-CM | POA: Diagnosis not present

## 2017-10-26 LAB — HEMOGLOBIN: HEMOGLOBIN: 9.6 g/dL — AB (ref 13.0–17.0)

## 2017-10-26 MED ORDER — DARBEPOETIN ALFA 60 MCG/0.3ML IJ SOSY
60.0000 ug | PREFILLED_SYRINGE | INTRAMUSCULAR | Status: DC
  Administered 2017-10-26: 60 ug via SUBCUTANEOUS
  Filled 2017-10-26: qty 0.3

## 2017-10-26 NOTE — Discharge Instructions (Signed)

## 2017-11-09 DIAGNOSIS — N186 End stage renal disease: Secondary | ICD-10-CM | POA: Diagnosis not present

## 2017-11-09 DIAGNOSIS — E785 Hyperlipidemia, unspecified: Secondary | ICD-10-CM | POA: Diagnosis not present

## 2017-11-09 DIAGNOSIS — D631 Anemia in chronic kidney disease: Secondary | ICD-10-CM | POA: Diagnosis not present

## 2017-11-09 DIAGNOSIS — Z940 Kidney transplant status: Secondary | ICD-10-CM | POA: Diagnosis not present

## 2017-11-23 ENCOUNTER — Encounter (HOSPITAL_COMMUNITY): Payer: Self-pay

## 2017-11-23 ENCOUNTER — Encounter (HOSPITAL_COMMUNITY)
Admission: RE | Admit: 2017-11-23 | Discharge: 2017-11-23 | Disposition: A | Discharge status: Home / Self Care | Payer: Medicare Other | Source: Ambulatory Visit | Attending: Nephrology | Admitting: Nephrology

## 2017-11-23 DIAGNOSIS — D631 Anemia in chronic kidney disease: Secondary | ICD-10-CM | POA: Insufficient documentation

## 2017-11-23 DIAGNOSIS — N186 End stage renal disease: Secondary | ICD-10-CM | POA: Insufficient documentation

## 2017-11-23 LAB — HEMOGLOBIN: HEMOGLOBIN: 10.3 g/dL — AB (ref 13.0–17.0)

## 2017-11-23 MED ORDER — DARBEPOETIN ALFA 60 MCG/0.3ML IJ SOSY
60.0000 ug | PREFILLED_SYRINGE | INTRAMUSCULAR | Status: DC
  Administered 2017-11-23: 60 ug via SUBCUTANEOUS
  Filled 2017-11-23: qty 0.3

## 2017-11-23 NOTE — Discharge Instructions (Signed)
Darbepoetin Alfa injection / aranesp What is this medicine? DARBEPOETIN ALFA (dar be POE e tin AL fa) helps your body make more red blood cells. It is used to treat anemia caused by chronic kidney failure and chemotherapy. This medicine may be used for other purposes; ask your health care provider or pharmacist if you have questions. COMMON BRAND NAME(S): Aranesp What should I tell my health care provider before I take this medicine? They need to know if you have any of these conditions: -blood clotting disorders or history of blood clots -cancer patient not on chemotherapy -cystic fibrosis -heart disease, such as angina, heart failure, or a history of a heart attack -hemoglobin level of 12 g/dL or greater -high blood pressure -low levels of folate, iron, or vitamin B12 -seizures -an unusual or allergic reaction to darbepoetin, erythropoietin, albumin, hamster proteins, latex, other medicines, foods, dyes, or preservatives -pregnant or trying to get pregnant -breast-feeding How should I use this medicine? This medicine is for injection into a vein or under the skin. It is usually given by a health care professional in a hospital or clinic setting. If you get this medicine at home, you will be taught how to prepare and give this medicine. Use exactly as directed. Take your medicine at regular intervals. Do not take your medicine more often than directed. It is important that you put your used needles and syringes in a special sharps container. Do not put them in a trash can. If you do not have a sharps container, call your pharmacist or healthcare provider to get one. A special MedGuide will be given to you by the pharmacist with each prescription and refill. Be sure to read this information carefully each time. Talk to your pediatrician regarding the use of this medicine in children. While this medicine may be used in children as young as 1 year for selected conditions, precautions do  apply. Overdosage: If you think you have taken too much of this medicine contact a poison control center or emergency room at once. NOTE: This medicine is only for you. Do not share this medicine with others. What if I miss a dose? If you miss a dose, take it as soon as you can. If it is almost time for your next dose, take only that dose. Do not take double or extra doses. What may interact with this medicine? Do not take this medicine with any of the following medications: -epoetin alfa This list may not describe all possible interactions. Give your health care provider a list of all the medicines, herbs, non-prescription drugs, or dietary supplements you use. Also tell them if you smoke, drink alcohol, or use illegal drugs. Some items may interact with your medicine. What should I watch for while using this medicine? Your condition will be monitored carefully while you are receiving this medicine. You may need blood work done while you are taking this medicine. What side effects may I notice from receiving this medicine? Side effects that you should report to your doctor or health care professional as soon as possible: -allergic reactions like skin rash, itching or hives, swelling of the face, lips, or tongue -breathing problems -changes in vision -chest pain -confusion, trouble speaking or understanding -feeling faint or lightheaded, falls -high blood pressure -muscle aches or pains -pain, swelling, warmth in the leg -rapid weight gain -severe headaches -sudden numbness or weakness of the face, arm or leg -trouble walking, dizziness, loss of balance or coordination -seizures (convulsions) -swelling of the ankles,  hands -unusually weak or tired Side effects that usually do not require medical attention (report to your doctor or health care professional if they continue or are bothersome): -diarrhea -fever, chills (flu-like symptoms) -headaches -nausea, vomiting -redness,  stinging, or swelling at site where injected This list may not describe all possible side effects. Call your doctor for medical advice about side effects. You may report side effects to FDA at 1-800-FDA-1088. Where should I keep my medicine? Keep out of the reach of children. Store in a refrigerator between 2 and 8 degrees C (36 and 46 degrees F). Do not freeze. Do not shake. Throw away any unused portion if using a single-dose vial. Throw away any unused medicine after the expiration date. NOTE: This sheet is a summary. It may not cover all possible information. If you have questions about this medicine, talk to your doctor, pharmacist, or health care provider.  2018 Elsevier/Gold Standard (2016-04-26 19:52:26)  

## 2017-12-21 ENCOUNTER — Encounter (HOSPITAL_COMMUNITY): Payer: Self-pay

## 2017-12-21 ENCOUNTER — Encounter (HOSPITAL_COMMUNITY)
Admission: RE | Admit: 2017-12-21 | Discharge: 2017-12-21 | Disposition: A | Discharge status: Home / Self Care | Payer: Medicare Other | Source: Ambulatory Visit | Attending: Nephrology | Admitting: Nephrology

## 2017-12-21 DIAGNOSIS — D631 Anemia in chronic kidney disease: Secondary | ICD-10-CM | POA: Diagnosis not present

## 2017-12-21 DIAGNOSIS — N186 End stage renal disease: Secondary | ICD-10-CM | POA: Insufficient documentation

## 2017-12-21 LAB — HEMOGLOBIN: Hemoglobin: 10.1 g/dL — ABNORMAL LOW (ref 13.0–17.0)

## 2017-12-21 MED ORDER — DARBEPOETIN ALFA 60 MCG/0.3ML IJ SOSY
60.0000 ug | PREFILLED_SYRINGE | INTRAMUSCULAR | Status: DC
  Administered 2017-12-21: 60 ug via SUBCUTANEOUS
  Filled 2017-12-21: qty 0.3

## 2017-12-28 DIAGNOSIS — E785 Hyperlipidemia, unspecified: Secondary | ICD-10-CM | POA: Diagnosis not present

## 2017-12-28 DIAGNOSIS — N189 Chronic kidney disease, unspecified: Secondary | ICD-10-CM | POA: Diagnosis not present

## 2017-12-28 DIAGNOSIS — I739 Peripheral vascular disease, unspecified: Secondary | ICD-10-CM | POA: Diagnosis not present

## 2017-12-28 DIAGNOSIS — N2581 Secondary hyperparathyroidism of renal origin: Secondary | ICD-10-CM | POA: Diagnosis not present

## 2017-12-28 DIAGNOSIS — I129 Hypertensive chronic kidney disease with stage 1 through stage 4 chronic kidney disease, or unspecified chronic kidney disease: Secondary | ICD-10-CM | POA: Diagnosis not present

## 2017-12-28 DIAGNOSIS — R001 Bradycardia, unspecified: Secondary | ICD-10-CM | POA: Diagnosis not present

## 2017-12-28 DIAGNOSIS — D631 Anemia in chronic kidney disease: Secondary | ICD-10-CM | POA: Diagnosis not present

## 2017-12-28 DIAGNOSIS — Z940 Kidney transplant status: Secondary | ICD-10-CM | POA: Diagnosis not present

## 2017-12-28 DIAGNOSIS — Z79899 Other long term (current) drug therapy: Secondary | ICD-10-CM | POA: Diagnosis not present

## 2017-12-28 DIAGNOSIS — E118 Type 2 diabetes mellitus with unspecified complications: Secondary | ICD-10-CM | POA: Diagnosis not present

## 2017-12-28 DIAGNOSIS — Z6829 Body mass index (BMI) 29.0-29.9, adult: Secondary | ICD-10-CM | POA: Diagnosis not present

## 2017-12-28 DIAGNOSIS — R0601 Orthopnea: Secondary | ICD-10-CM | POA: Diagnosis not present

## 2018-01-18 ENCOUNTER — Encounter (HOSPITAL_COMMUNITY)
Admission: RE | Admit: 2018-01-18 | Discharge: 2018-01-18 | Disposition: A | Discharge status: Home / Self Care | Payer: Medicare Other | Source: Ambulatory Visit | Attending: Nephrology | Admitting: Nephrology

## 2018-01-18 ENCOUNTER — Encounter (HOSPITAL_COMMUNITY): Payer: Self-pay

## 2018-01-18 DIAGNOSIS — D631 Anemia in chronic kidney disease: Secondary | ICD-10-CM | POA: Diagnosis not present

## 2018-01-18 DIAGNOSIS — N186 End stage renal disease: Secondary | ICD-10-CM | POA: Diagnosis not present

## 2018-01-18 LAB — HEMOGLOBIN: Hemoglobin: 9.9 g/dL — ABNORMAL LOW (ref 13.0–17.0)

## 2018-01-18 MED ORDER — DARBEPOETIN ALFA 60 MCG/0.3ML IJ SOSY
60.0000 ug | PREFILLED_SYRINGE | INTRAMUSCULAR | Status: DC
  Administered 2018-01-18: 60 ug via SUBCUTANEOUS
  Filled 2018-01-18: qty 0.3

## 2018-01-31 DIAGNOSIS — Z940 Kidney transplant status: Secondary | ICD-10-CM | POA: Diagnosis not present

## 2018-01-31 DIAGNOSIS — E7800 Pure hypercholesterolemia, unspecified: Secondary | ICD-10-CM | POA: Diagnosis not present

## 2018-01-31 DIAGNOSIS — I10 Essential (primary) hypertension: Secondary | ICD-10-CM | POA: Diagnosis not present

## 2018-01-31 DIAGNOSIS — G609 Hereditary and idiopathic neuropathy, unspecified: Secondary | ICD-10-CM | POA: Diagnosis not present

## 2018-01-31 DIAGNOSIS — E1165 Type 2 diabetes mellitus with hyperglycemia: Secondary | ICD-10-CM | POA: Diagnosis not present

## 2018-02-15 ENCOUNTER — Encounter (HOSPITAL_COMMUNITY): Payer: Self-pay

## 2018-02-15 ENCOUNTER — Encounter (HOSPITAL_COMMUNITY)
Admission: RE | Admit: 2018-02-15 | Discharge: 2018-02-15 | Disposition: A | Discharge status: Home / Self Care | Payer: Medicare Other | Source: Ambulatory Visit | Attending: Nephrology | Admitting: Nephrology

## 2018-02-15 DIAGNOSIS — N186 End stage renal disease: Secondary | ICD-10-CM | POA: Diagnosis not present

## 2018-02-15 DIAGNOSIS — D631 Anemia in chronic kidney disease: Secondary | ICD-10-CM | POA: Diagnosis not present

## 2018-02-15 LAB — HEMOGLOBIN: HEMOGLOBIN: 9.5 g/dL — AB (ref 13.0–17.0)

## 2018-02-15 MED ORDER — DARBEPOETIN ALFA 60 MCG/0.3ML IJ SOSY
60.0000 ug | PREFILLED_SYRINGE | INTRAMUSCULAR | Status: AC
  Administered 2018-02-15: 60 ug via SUBCUTANEOUS
  Filled 2018-02-15: qty 0.3

## 2018-02-15 NOTE — Discharge Instructions (Signed)

## 2018-02-27 ENCOUNTER — Other Ambulatory Visit (HOSPITAL_COMMUNITY): Payer: Self-pay | Admitting: *Deleted

## 2018-02-28 ENCOUNTER — Other Ambulatory Visit: Payer: Self-pay | Admitting: Interventional Cardiology

## 2018-03-09 DIAGNOSIS — H401134 Primary open-angle glaucoma, bilateral, indeterminate stage: Secondary | ICD-10-CM | POA: Diagnosis not present

## 2018-03-15 ENCOUNTER — Encounter (HOSPITAL_COMMUNITY): Payer: Self-pay

## 2018-03-15 ENCOUNTER — Encounter (HOSPITAL_COMMUNITY)
Admission: RE | Admit: 2018-03-15 | Discharge: 2018-03-15 | Disposition: A | Discharge status: Home / Self Care | Payer: Medicare Other | Source: Ambulatory Visit | Attending: Nephrology | Admitting: Nephrology

## 2018-03-15 DIAGNOSIS — D631 Anemia in chronic kidney disease: Secondary | ICD-10-CM | POA: Diagnosis not present

## 2018-03-15 DIAGNOSIS — N186 End stage renal disease: Secondary | ICD-10-CM | POA: Insufficient documentation

## 2018-03-15 LAB — HEMOGLOBIN: Hemoglobin: 9.8 g/dL — ABNORMAL LOW (ref 13.0–17.0)

## 2018-03-15 MED ORDER — DARBEPOETIN ALFA 60 MCG/0.3ML IJ SOSY
60.0000 ug | PREFILLED_SYRINGE | INTRAMUSCULAR | Status: DC
  Administered 2018-03-15: 60 ug via SUBCUTANEOUS
  Filled 2018-03-15: qty 0.3

## 2018-03-15 NOTE — Discharge Instructions (Signed)

## 2018-04-12 ENCOUNTER — Encounter (HOSPITAL_COMMUNITY): Payer: Self-pay

## 2018-04-12 ENCOUNTER — Encounter (HOSPITAL_COMMUNITY)
Admission: RE | Admit: 2018-04-12 | Discharge: 2018-04-12 | Disposition: A | Discharge status: Home / Self Care | Payer: Medicare Other | Source: Ambulatory Visit | Attending: Nephrology | Admitting: Nephrology

## 2018-04-12 DIAGNOSIS — D631 Anemia in chronic kidney disease: Secondary | ICD-10-CM | POA: Diagnosis not present

## 2018-04-12 DIAGNOSIS — N186 End stage renal disease: Secondary | ICD-10-CM | POA: Diagnosis not present

## 2018-04-12 LAB — HEMOGLOBIN: HEMOGLOBIN: 10.7 g/dL — AB (ref 13.0–17.0)

## 2018-04-12 MED ORDER — DARBEPOETIN ALFA 60 MCG/0.3ML IJ SOSY: 60.0000 ug | PREFILLED_SYRINGE | INTRAMUSCULAR | Status: DC

## 2018-04-12 MED ORDER — DARBEPOETIN ALFA 60 MCG/0.3ML IJ SOSY
60.0000 ug | PREFILLED_SYRINGE | INTRAMUSCULAR | Status: DC
  Administered 2018-04-12: 60 ug via SUBCUTANEOUS
  Filled 2018-04-12: qty 0.3

## 2018-05-09 DIAGNOSIS — E1129 Type 2 diabetes mellitus with other diabetic kidney complication: Secondary | ICD-10-CM | POA: Diagnosis not present

## 2018-05-09 DIAGNOSIS — Z940 Kidney transplant status: Secondary | ICD-10-CM | POA: Diagnosis not present

## 2018-05-09 DIAGNOSIS — E785 Hyperlipidemia, unspecified: Secondary | ICD-10-CM | POA: Diagnosis not present

## 2018-05-09 DIAGNOSIS — N189 Chronic kidney disease, unspecified: Secondary | ICD-10-CM | POA: Diagnosis not present

## 2018-05-09 DIAGNOSIS — I129 Hypertensive chronic kidney disease with stage 1 through stage 4 chronic kidney disease, or unspecified chronic kidney disease: Secondary | ICD-10-CM | POA: Diagnosis not present

## 2018-05-10 ENCOUNTER — Encounter (HOSPITAL_COMMUNITY)
Admission: RE | Admit: 2018-05-10 | Discharge: 2018-05-10 | Disposition: A | Discharge status: Home / Self Care | Payer: Medicare Other | Source: Ambulatory Visit | Attending: Nephrology | Admitting: Nephrology

## 2018-05-10 ENCOUNTER — Encounter (HOSPITAL_COMMUNITY): Payer: Self-pay

## 2018-05-10 DIAGNOSIS — D631 Anemia in chronic kidney disease: Secondary | ICD-10-CM | POA: Insufficient documentation

## 2018-05-10 DIAGNOSIS — N186 End stage renal disease: Secondary | ICD-10-CM | POA: Diagnosis not present

## 2018-05-10 LAB — HEMOGLOBIN: Hemoglobin: 10.2 g/dL — ABNORMAL LOW (ref 13.0–17.0)

## 2018-05-10 MED ORDER — DARBEPOETIN ALFA 60 MCG/0.3ML IJ SOSY
60.0000 ug | PREFILLED_SYRINGE | INTRAMUSCULAR | Status: DC
  Administered 2018-05-10: 60 ug via SUBCUTANEOUS
  Filled 2018-05-10: qty 0.3

## 2018-05-11 DIAGNOSIS — D631 Anemia in chronic kidney disease: Secondary | ICD-10-CM | POA: Diagnosis not present

## 2018-05-11 DIAGNOSIS — Z79899 Other long term (current) drug therapy: Secondary | ICD-10-CM | POA: Diagnosis not present

## 2018-05-11 DIAGNOSIS — I129 Hypertensive chronic kidney disease with stage 1 through stage 4 chronic kidney disease, or unspecified chronic kidney disease: Secondary | ICD-10-CM | POA: Diagnosis not present

## 2018-05-11 DIAGNOSIS — R001 Bradycardia, unspecified: Secondary | ICD-10-CM | POA: Diagnosis not present

## 2018-05-11 DIAGNOSIS — Z6829 Body mass index (BMI) 29.0-29.9, adult: Secondary | ICD-10-CM | POA: Diagnosis not present

## 2018-05-11 DIAGNOSIS — R0601 Orthopnea: Secondary | ICD-10-CM | POA: Diagnosis not present

## 2018-05-11 DIAGNOSIS — Z940 Kidney transplant status: Secondary | ICD-10-CM | POA: Diagnosis not present

## 2018-05-11 DIAGNOSIS — E785 Hyperlipidemia, unspecified: Secondary | ICD-10-CM | POA: Diagnosis not present

## 2018-05-11 DIAGNOSIS — R0600 Dyspnea, unspecified: Secondary | ICD-10-CM | POA: Diagnosis not present

## 2018-05-11 DIAGNOSIS — N2581 Secondary hyperparathyroidism of renal origin: Secondary | ICD-10-CM | POA: Diagnosis not present

## 2018-05-11 DIAGNOSIS — E118 Type 2 diabetes mellitus with unspecified complications: Secondary | ICD-10-CM | POA: Diagnosis not present

## 2018-05-11 DIAGNOSIS — I739 Peripheral vascular disease, unspecified: Secondary | ICD-10-CM | POA: Diagnosis not present

## 2018-05-25 ENCOUNTER — Other Ambulatory Visit: Payer: Self-pay | Admitting: Interventional Cardiology

## 2018-06-07 ENCOUNTER — Encounter (HOSPITAL_COMMUNITY)
Admission: RE | Admit: 2018-06-07 | Discharge: 2018-06-07 | Disposition: A | Discharge status: Home / Self Care | Payer: Medicare Other | Source: Ambulatory Visit | Attending: Nephrology | Admitting: Nephrology

## 2018-06-07 ENCOUNTER — Encounter (HOSPITAL_COMMUNITY): Payer: Self-pay

## 2018-06-07 DIAGNOSIS — N185 Chronic kidney disease, stage 5: Secondary | ICD-10-CM | POA: Insufficient documentation

## 2018-06-07 DIAGNOSIS — D631 Anemia in chronic kidney disease: Secondary | ICD-10-CM | POA: Diagnosis not present

## 2018-06-07 LAB — HEMOGLOBIN: Hemoglobin: 10.4 g/dL — ABNORMAL LOW (ref 13.0–17.0)

## 2018-06-07 MED ORDER — DARBEPOETIN ALFA 60 MCG/0.3ML IJ SOSY
60.0000 ug | PREFILLED_SYRINGE | INTRAMUSCULAR | Status: DC
  Administered 2018-06-07: 60 ug via SUBCUTANEOUS
  Filled 2018-06-07: qty 0.3

## 2018-06-08 DIAGNOSIS — I10 Essential (primary) hypertension: Secondary | ICD-10-CM | POA: Diagnosis not present

## 2018-06-08 DIAGNOSIS — E7800 Pure hypercholesterolemia, unspecified: Secondary | ICD-10-CM | POA: Diagnosis not present

## 2018-06-08 DIAGNOSIS — Z940 Kidney transplant status: Secondary | ICD-10-CM | POA: Diagnosis not present

## 2018-06-08 DIAGNOSIS — G609 Hereditary and idiopathic neuropathy, unspecified: Secondary | ICD-10-CM | POA: Diagnosis not present

## 2018-06-08 DIAGNOSIS — E1165 Type 2 diabetes mellitus with hyperglycemia: Secondary | ICD-10-CM | POA: Diagnosis not present

## 2018-06-23 DIAGNOSIS — E1142 Type 2 diabetes mellitus with diabetic polyneuropathy: Secondary | ICD-10-CM | POA: Diagnosis not present

## 2018-07-05 ENCOUNTER — Encounter (HOSPITAL_COMMUNITY): Payer: Medicare Other

## 2018-07-17 DIAGNOSIS — Z23 Encounter for immunization: Secondary | ICD-10-CM | POA: Diagnosis not present

## 2018-07-17 DIAGNOSIS — G609 Hereditary and idiopathic neuropathy, unspecified: Secondary | ICD-10-CM | POA: Diagnosis not present

## 2018-07-17 DIAGNOSIS — I10 Essential (primary) hypertension: Secondary | ICD-10-CM | POA: Diagnosis not present

## 2018-07-17 DIAGNOSIS — E1165 Type 2 diabetes mellitus with hyperglycemia: Secondary | ICD-10-CM | POA: Diagnosis not present

## 2018-07-17 DIAGNOSIS — E7800 Pure hypercholesterolemia, unspecified: Secondary | ICD-10-CM | POA: Diagnosis not present

## 2018-07-17 DIAGNOSIS — Z940 Kidney transplant status: Secondary | ICD-10-CM | POA: Diagnosis not present

## 2018-08-02 ENCOUNTER — Emergency Department (HOSPITAL_COMMUNITY)
Admission: EM | Admit: 2018-08-02 | Discharge: 2018-08-02 | Disposition: A | Discharge status: Home / Self Care | Payer: Medicare Other | Attending: Emergency Medicine | Admitting: Emergency Medicine

## 2018-08-02 ENCOUNTER — Emergency Department (HOSPITAL_COMMUNITY): Payer: Medicare Other

## 2018-08-02 ENCOUNTER — Encounter (HOSPITAL_COMMUNITY): Payer: Self-pay | Admitting: Emergency Medicine

## 2018-08-02 ENCOUNTER — Encounter (HOSPITAL_COMMUNITY)
Admission: RE | Admit: 2018-08-02 | Discharge: 2018-08-02 | Disposition: A | Discharge status: Home / Self Care | Payer: Medicare Other | Source: Ambulatory Visit | Attending: Nephrology | Admitting: Nephrology

## 2018-08-02 ENCOUNTER — Other Ambulatory Visit: Payer: Self-pay

## 2018-08-02 DIAGNOSIS — Z940 Kidney transplant status: Secondary | ICD-10-CM | POA: Insufficient documentation

## 2018-08-02 DIAGNOSIS — Z7982 Long term (current) use of aspirin: Secondary | ICD-10-CM | POA: Diagnosis not present

## 2018-08-02 DIAGNOSIS — Y92009 Unspecified place in unspecified non-institutional (private) residence as the place of occurrence of the external cause: Secondary | ICD-10-CM | POA: Insufficient documentation

## 2018-08-02 DIAGNOSIS — N185 Chronic kidney disease, stage 5: Secondary | ICD-10-CM | POA: Diagnosis not present

## 2018-08-02 DIAGNOSIS — I4891 Unspecified atrial fibrillation: Secondary | ICD-10-CM | POA: Insufficient documentation

## 2018-08-02 DIAGNOSIS — S81812A Laceration without foreign body, left lower leg, initial encounter: Secondary | ICD-10-CM | POA: Insufficient documentation

## 2018-08-02 DIAGNOSIS — D631 Anemia in chronic kidney disease: Secondary | ICD-10-CM | POA: Insufficient documentation

## 2018-08-02 DIAGNOSIS — W2209XA Striking against other stationary object, initial encounter: Secondary | ICD-10-CM | POA: Diagnosis not present

## 2018-08-02 DIAGNOSIS — Z23 Encounter for immunization: Secondary | ICD-10-CM | POA: Diagnosis not present

## 2018-08-02 DIAGNOSIS — R58 Hemorrhage, not elsewhere classified: Secondary | ICD-10-CM | POA: Diagnosis not present

## 2018-08-02 DIAGNOSIS — Y939 Activity, unspecified: Secondary | ICD-10-CM | POA: Insufficient documentation

## 2018-08-02 DIAGNOSIS — M109 Gout, unspecified: Secondary | ICD-10-CM | POA: Diagnosis not present

## 2018-08-02 DIAGNOSIS — Y999 Unspecified external cause status: Secondary | ICD-10-CM | POA: Diagnosis not present

## 2018-08-02 DIAGNOSIS — I10 Essential (primary) hypertension: Secondary | ICD-10-CM | POA: Diagnosis not present

## 2018-08-02 DIAGNOSIS — E119 Type 2 diabetes mellitus without complications: Secondary | ICD-10-CM | POA: Insufficient documentation

## 2018-08-02 LAB — HEMOGLOBIN: HEMOGLOBIN: 9.6 g/dL — AB (ref 13.0–17.0)

## 2018-08-02 MED ORDER — DOXYCYCLINE HYCLATE 100 MG PO TABS
100.0000 mg | ORAL_TABLET | Freq: Once | ORAL | Status: AC
  Administered 2018-08-02: 100 mg via ORAL
  Filled 2018-08-02: qty 1

## 2018-08-02 MED ORDER — HYDROCODONE-ACETAMINOPHEN 5-325 MG PO TABS
1.0000 | ORAL_TABLET | Freq: Once | ORAL | Status: AC
  Administered 2018-08-02: 1 via ORAL
  Filled 2018-08-02: qty 1

## 2018-08-02 MED ORDER — HYDROCODONE-ACETAMINOPHEN 5-325 MG PO TABS: 1.0000 | ORAL_TABLET | Freq: Three times a day (TID) | ORAL | 0 refills | Status: DC | PRN

## 2018-08-02 MED ORDER — DOXYCYCLINE HYCLATE 100 MG PO CAPS: 100.0000 mg | ORAL_CAPSULE | Freq: Two times a day (BID) | ORAL | 0 refills | Status: AC

## 2018-08-02 MED ORDER — LIDOCAINE-EPINEPHRINE (PF) 2 %-1:200000 IJ SOLN
20.0000 mL | Freq: Once | INTRAMUSCULAR | Status: AC
  Administered 2018-08-02: 20 mL
  Filled 2018-08-02: qty 20

## 2018-08-02 MED ORDER — DARBEPOETIN ALFA 60 MCG/0.3ML IJ SOSY
60.0000 ug | PREFILLED_SYRINGE | INTRAMUSCULAR | Status: DC
  Administered 2018-08-02: 60 ug via SUBCUTANEOUS
  Filled 2018-08-02: qty 0.3

## 2018-08-02 MED ORDER — TETANUS-DIPHTH-ACELL PERTUSSIS 5-2.5-18.5 LF-MCG/0.5 IM SUSP
0.5000 mL | Freq: Once | INTRAMUSCULAR | Status: AC
  Administered 2018-08-02: 0.5 mL via INTRAMUSCULAR
  Filled 2018-08-02: qty 0.5

## 2018-08-02 NOTE — Discharge Instructions (Signed)
Please take Tylenol (acetaminophen) to relieve your pain.  You may take tylenol, up to 1,000 mg (two extra strength pills).  Do not take more than 3,000 mg tylenol in a 24 hour period.  Please check all medication labels as many medications such as pain and cold medications may contain tylenol. Please do not drink alcohol while taking this medication.   Today you received medications that may make you sleepy or impair your ability to make decisions.  For the next 24 hours please do not drive, operate heavy machinery, care for a small child with out another adult present, or perform any activities that may cause harm to you or someone else if you were to fall asleep or be impaired.   You are being prescribed a medication which may make you sleepy. Please follow up of listed precautions for at least 24 hours after taking one dose.  You may have diarrhea from the antibiotics.  It is very important that you continue to take the antibiotics even if you get diarrhea unless a medical professional tells you that you may stop taking them.  If you stop too early the bacteria you are being treated for will become stronger and you may need different, more powerful antibiotics that have more side effects and worsening diarrhea.  Please stay well hydrated and consider probiotics as they may decrease the severity of your diarrhea.

## 2018-08-02 NOTE — ED Notes (Signed)
Bed: WA09 Expected date:  Expected time:  Means of arrival:  Comments: EMS/leg lac/hypertension

## 2018-08-02 NOTE — ED Provider Notes (Signed)
[facility] DEPT Provider Note   CSN: [id number] Arrival date & time: 08/02/18  1742     History   Chief Complaint Chief Complaint  Patient presents with  . Laceration    HPI [name] is a 72 y.o. male with a past medical history of A. fib, DM, kidney transplant on immunosuppression, who presents today for evaluation of a laceration.  Approximately 1 hour ago he was at home when he hit his left leg on the edge of a car door.  He says that he then attempted to walk to the mailbox and had blood dripping down into his shoe.  He says the only anticoagulation he takes is baby aspirin.  He denies any numbness or weakness.  He does not have any other complaints or concerns today.  He is unsure when his last tetanus shot was.  HPI  Past Medical History:  Diagnosis Date  . Anemia   . Atrial fibrillation ([place])   . Chronic kidney disease   . Diabetes mellitus   . Dysrhythmia   . Hyperlipidemia   . Neuropathy     Patient Active Problem List   Diagnosis Date Noted  . Chronic diastolic heart failure ([place]) 03/25/2017  . Essential hypertension 03/25/2017  . Chronic anticoagulation 02/04/2017  . Dyspnea 02/03/2017  . Varicose veins of lower extremities with ulcer ([place]) 10/15/2015  . Kidney transplant status, cadaveric 10/15/2015  . Immunosuppressed status ([place]) 10/15/2015  . Paronychia of third finger of right hand 07/16/2011  . ESRD (end stage renal disease) ([place]) 07/16/2011    Past Surgical History:  Procedure Laterality Date  . access proceds    . achlles tendon repair    . ARTERIOVENOUS GRAFT PLACEMENT    . bowel repla     repair  . [place] and 2005  . LIGATION GORETEX GRAFT  06/30/11   Right upper arm        Home Medications    Prior to Admission medications   Medication Sig Start Date End Date Taking? Authorizing Provider  acetaminophen (TYLENOL) 500 MG tablet Take 500 mg by mouth as needed (1-2x daily prn  pain).   Yes [provider]  aspirin 81 MG tablet Take 81 mg by mouth daily.     Yes [provider]  atorvastatin (LIPITOR) 10 MG tablet Take 10 mg by mouth daily.   Yes [provider]  calcitRIOL (ROCALTROL) 0.25 MCG capsule Take 0.25 mcg by mouth 3 (three) times a week. (Mondays, Wednesdays, and Fridays) 07/22/15  Yes [provider]  carvedilol (COREG) 6.25 MG tablet Take 1 tablet (6.25 mg total) by mouth 2 (two) times daily. 09/29/17  Yes Belva Crome, MD  diphenhydrAMINE (BENADRYL) 25 mg capsule Take 25 mg by mouth at bedtime as needed (for sleep/allergies.).    Yes [provider]  furosemide (LASIX) 40 MG tablet Take 1 tablet (40 mg total) by mouth daily. Fluid. 04/25/17  Yes Belva Crome, MD  gabapentin (NEURONTIN) 100 MG capsule Take 100 mg by mouth 3 (three) times daily as needed (for nerve pain). Take three times/day as needed   Yes [provider]  HUMALOG KWIKPEN 100 UNIT/ML KwikPen Inject 3-5 Units into the skin 3 (three) times daily. 07/17/18  Yes [provider]  LANTUS SOLOSTAR 100 UNIT/ML Solostar Pen Inject 7-15 Units into the skin daily. If blood sugars are >300, use 15 units, <300 use 7 units 06/04/14  Yes [provider]  latanoprost (XALATAN) 0.005 % ophthalmic solution Place 1 drop into both eyes at bedtime. 07/28/18  Yes [provider]  linagliptin (TRADJENTA) 5 MG TABS tablet Take 5 mg by mouth daily.     Yes [provider]  losartan (COZAAR) 25 MG tablet TAKE 1 TABLET DAILY. PLEASE MAKE YEARLY APPOINTMENT WITH DR Tamala Julian FOR JULY BEFORE ANY MORE REFILLS. FIRST ATTEMPT. [phone]. Patient taking differently: Take 25 mg by mouth daily.  05/25/18  Yes Belva Crome, MD  mycophenolate (CELLCEPT) 500 MG tablet Take 500 mg by mouth 2 (two) times daily.    Yes [provider]  predniSONE (DELTASONE) 5 MG tablet Take 5 mg by mouth daily. 07/07/18  Yes [provider]    tacrolimus (PROGRAF) 1 MG capsule Take 1 mg by mouth 2 (two) times daily.    Yes [provider]  VASCEPA 1 g CAPS Take 4 g by mouth daily. 11/24/16  Yes [provider]  Alcohol Swabs (B-D SINGLE USE SWABS REGULAR) PADS Use as directed 09/07/15   [provider]  BAYER CONTOUR NEXT TEST test strip Use as directed 09/12/15   [provider]  BD PEN NEEDLE NANO U/F 32G X 4 MM MISC Use as directed 09/25/15   [provider]  doxycycline (VIBRAMYCIN) 100 MG capsule Take 1 capsule (100 mg total) by mouth 2 (two) times daily for 7 days. 08/02/18 08/09/18  Lorin Glass, PA-C  HYDROcodone-acetaminophen (NORCO/VICODIN) 5-325 MG tablet Take 1 tablet by mouth every 8 (eight) hours as needed for severe pain. 08/02/18   Lorin Glass, PA-C  MICROLET LANCETS MISC Use as directed 07/14/15   [provider]    Family History Family History  Problem Relation Age of Onset  . Heart disease Mother   . Heart attack Father   . Stroke Sister     Social History Social History   Tobacco Use  . Smoking status: Never Smoker  . Smokeless tobacco: Never Used  Substance Use Topics  . Alcohol use: No    Alcohol/week: 0.0 standard drinks  . Drug use: No     Allergies   Elavil [amitriptyline hcl]   Review of Systems Review of Systems  Constitutional: Negative for chills and fever.  HENT: Negative for congestion.   Respiratory: Negative for shortness of breath.   Musculoskeletal: Negative for arthralgias and neck pain.  Neurological: Negative for weakness and numbness.  All other systems reviewed and are negative.    Physical Exam Updated Vital Signs BP (!) 161/81 (BP Location: Left Arm)   Pulse (!) 55   Temp 97.9 F (36.6 C) (Oral)   Resp 18   SpO2 100%   Physical Exam  Constitutional: He appears well-developed and well-nourished. No distress.  HENT:  Head: Normocephalic and atraumatic.  Mouth/Throat: Oropharynx is clear  and moist.  Eyes: Conjunctivae are normal. Right eye exhibits no discharge. Left eye exhibits no discharge. No scleral icterus.  Neck: Normal range of motion.  Cardiovascular: Normal rate, regular rhythm and intact distal pulses.  Left leg 2+ DP/PT pulses.  Left foot is warm, well-perfused.  Pulmonary/Chest: Effort normal. No stridor. No respiratory distress.  Abdominal: He exhibits no distension.  Musculoskeletal: He exhibits no edema or deformity.  Patient is able to bear weight on left leg.  No obvious crepitus or deformities to left lower leg.  Neurological: He is alert. No sensory deficit. He exhibits normal muscle tone.  Skin: Skin is warm and dry. He is  not diaphoretic.  There is a 6 cm laceration on the left anterior shin.  Laceration is through the dermis.  There are 2 varicose veins  That are oozing.   Psychiatric: He has a normal mood and affect. His behavior is normal.  Nursing note and vitals reviewed.    ED Treatments / Results  Labs (all labs ordered are listed, but only abnormal results are displayed) Labs Reviewed - No data to display  EKG None  Radiology Dg Tibia/fibula Left  Result Date: 08/02/2018 CLINICAL DATA:  For its laceration to left lower leg after hitting it on a car door this evening. EXAM: LEFT TIBIA AND FIBULA - 2 VIEW COMPARISON:  09/26/2015 FINDINGS: Osteopenic appearance of the tibia and fibula with extensive soft tissue vascular calcifications noted. Pressure bandaging projects over the anterior aspect of the mid left leg with subtle soft tissue deformity suspicious for site of soft tissue laceration along the midshaft. No radiopaque foreign body. No acute osseous abnormality or involvement. Osteoarthritis of the ankle joint with subchondral cystic change of the talar dome. IMPRESSION: 1. No acute osseous abnormality. 2. No radiopaque foreign body. 3. Pressure bandaging limits assessment of the soft tissues overlying the anterior aspect of the left leg.  There is soft tissue irregularity suspicious for the site of soft tissue laceration along the midshaft anteriorly. 4. Osteoarthritis of the ankle joint with subchondral cystic change of the talar dome. Electronically Signed   By: Ashley Royalty M.D.   On: 08/02/2018 19:50    Procedures .Marland KitchenLaceration Repair Date/Time: 08/02/2018 10:01 PM Performed by: Lorin Glass, PA-C Authorized by: Lorin Glass, PA-C   Consent:    Consent obtained:  Verbal   Consent given by:  Patient   Risks discussed:  Infection, need for additional repair, poor cosmetic result, pain, retained foreign body, tendon damage, vascular damage, poor wound healing and nerve damage   Alternatives discussed:  No treatment and referral (Alternative wound closures) Anesthesia (see MAR for exact dosages):    Anesthesia method:  Topical application and local infiltration   Local anesthetic:  Lidocaine 2% WITH epi Laceration details:    Location:  Leg   Leg location:  L lower leg   Length (cm):  6 Repair type:    Repair type:  Simple Pre-procedure details:    Preparation:  Patient was prepped and draped in usual sterile fashion and imaging obtained to evaluate for foreign bodies Exploration:    Hemostasis achieved with:  Epinephrine and direct pressure   Wound exploration: wound explored through full range of motion and entire depth of wound probed and visualized     Wound extent: vascular damage (Superficial veinous bleeding)   Treatment:    Area cleansed with:  Saline   Amount of cleaning:  Extensive Post-procedure details:    Dressing:  Non-adherent dressing and bulky dressing   Patient tolerance of procedure:  Tolerated well, no immediate complications Comments:     Iodoform gauze with Telfa nonstick dressing and bulky dressing with slight pressure.   (including critical care time)  Medications Ordered in ED Medications  Tdap (BOOSTRIX) injection 0.5 mL (0.5 mLs Intramuscular Given 08/02/18 1839)    lidocaine-EPINEPHrine (XYLOCAINE W/EPI) 2 %-1:200000 (PF) injection 20 mL (20 mLs Infiltration Given by Other 08/02/18 1839)  doxycycline (VIBRA-TABS) tablet 100 mg (100 mg Oral Given 08/02/18 2209)  HYDROcodone-acetaminophen (NORCO/VICODIN) 5-325 MG per tablet 1 tablet (1 tablet Oral Given 08/02/18 2209)     Initial Impression / Assessment and Plan /  ED Course  I have reviewed the triage vital signs and the nursing notes.  Pertinent labs & imaging results that were available during my care of the patient were reviewed by me and considered in my medical decision making (see chart for details).    Patient presents today for evaluation after striking his lower leg on a car door shortly prior to arrival.  He was unsure when his last tetanus shot was, Tdap updated.  X-rays were obtained which did not show evidence of fracture or other acute osseous abnormalities.  Exam showed a laceration on the leg.  There was tension across the wound with hematoma, therefore unable to close wound.  Patient is very thin skin, most likely from many years of prednisone use.    This patient was seen as a shared visit with Dr. Billy Fischer.    Patient has some level of CKD, does not have recent creatinine in the system, therefore unable to renally dose Keflex, therefore will give doxycycline instead.  He is at high risk for poor wound healing, he is aware of this and is instructed to have close follow-up with PCP.  He is aware he is hypertensive and needs to follow up with PCP.    Final Clinical Impressions(s) / ED Diagnoses   Final diagnoses:  Laceration of left lower extremity, initial encounter    ED Discharge Orders         Ordered    doxycycline (VIBRAMYCIN) 100 MG capsule  2 times daily     08/02/18 2210    HYDROcodone-acetaminophen (NORCO/VICODIN) 5-325 MG tablet  Every 8 hours PRN     08/02/18 2210           Lorin Glass, PA-C 08/02/18 2320    Gareth Morgan, MD 08/04/18 1156

## 2018-08-02 NOTE — ED Triage Notes (Signed)
Per EMS, patient from home, approximately four inch lac to left leg after hitting it on car. Bleeding controlled.  Hypertensive with EMS. Ambulatory.  BP 190/80

## 2018-08-08 ENCOUNTER — Encounter: Payer: Medicare Other | Attending: Physician Assistant | Admitting: Physician Assistant

## 2018-08-08 DIAGNOSIS — E11622 Type 2 diabetes mellitus with other skin ulcer: Secondary | ICD-10-CM | POA: Diagnosis not present

## 2018-08-08 DIAGNOSIS — L97822 Non-pressure chronic ulcer of other part of left lower leg with fat layer exposed: Secondary | ICD-10-CM | POA: Insufficient documentation

## 2018-08-08 DIAGNOSIS — I87312 Chronic venous hypertension (idiopathic) with ulcer of left lower extremity: Secondary | ICD-10-CM | POA: Diagnosis not present

## 2018-08-08 DIAGNOSIS — E1140 Type 2 diabetes mellitus with diabetic neuropathy, unspecified: Secondary | ICD-10-CM | POA: Diagnosis not present

## 2018-08-10 NOTE — Progress Notes (Signed)
KLEBER, CREAN ([id number]) Visit Report for 08/08/2018 Abuse/Suicide Risk Screen Details Patient Name: [name], [name] Date of Service: 08/08/2018 8:45 AM Medical Record Number: [id number] Patient Account Number: [id number] Date of Birth/Sex: Aug 13, 1946 (72 y.o. M) Treating RN: Montey Hora Primary Care Marlo Arriola: [name] Other Clinician: Referring Loc Feinstein: Referral, Self Treating Cort Dragoo/Extender: STONE III, HOYT Weeks in Treatment: 0 Abuse/Suicide Risk Screen Items Answer ABUSE/SUICIDE RISK SCREEN: Has anyone close to you tried to hurt or harm you recentlyo No Do you feel uncomfortable with anyone in your familyo No Has anyone forced you do things that you didnot want to doo No Do you have any thoughts of harming yourselfo No Patient displays signs or symptoms of abuse and/or neglect. No Electronic Signature(s) Signed: 08/08/2018 4:52:40 PM By: Montey Hora Entered By: Montey Hora on 08/08/2018 09:48:33 [name] ([id number]) -------------------------------------------------------------------------------- Activities of Daily Living Details Patient Name: [name] Date of Service: 08/08/2018 8:45 AM Medical Record Number: [id number] Patient Account Number: [id number] Date of Birth/Sex: September 22, 1945 (72 y.o. M) Treating RN: Montey Hora Primary Care Aubre Quincy: [name] Other Clinician: Referring Ia Leeb: Referral, Self Treating Tavion Senkbeil/Extender: STONE III, HOYT Weeks in Treatment: 0 Activities of Daily Living Items Answer Activities of Daily Living (Please select one for each item) Drive Automobile Completely Able Take Medications Completely Able Use Telephone Completely Able Care for Appearance Completely Able Use Toilet Completely Able Bath / Shower Completely Able Dress Self Completely Able Feed Self Completely Able Walk Completely Able Get In / Out Bed Completely Able Housework Completely Able Prepare Meals Completely  [place] for Self Completely Able Electronic Signature(s) Signed: 08/08/2018 4:52:40 PM By: Montey Hora Entered By: Montey Hora on 08/08/2018 09:48:45 [name] ([id number]) -------------------------------------------------------------------------------- Education Assessment Details Patient Name: [name] Date of Service: 08/08/2018 8:45 AM Medical Record Number: [id number] Patient Account Number: [id number] Date of Birth/Sex: Apr 20, 1946 (72 y.o. M) Treating RN: Montey Hora Primary Care Semaya Vida: [name] Other Clinician: Referring Davontae Prusinski: Referral, Self Treating Elmore Hyslop/Extender: Melburn Hake, HOYT Weeks in Treatment: 0 Primary Learner Assessed: Patient Learning Preferences/Education Level/Primary Language Learning Preference: Explanation Highest Education Level: High School Preferred Language: English Cognitive Barrier Assessment/Beliefs Language Barrier: No Translator Needed: No Memory Deficit: No Emotional Barrier: No Cultural/Religious Beliefs Affecting Medical Care: No Physical Barrier Assessment Impaired Vision: No Impaired Hearing: No Decreased Hand dexterity: No Knowledge/Comprehension Assessment Knowledge Level: High Comprehension Level: High Ability to understand written High instructions: Ability to understand verbal High instructions: Motivation Assessment Anxiety Level: Calm Cooperation: Cooperative Education Importance: Acknowledges Need Interest in Health Problems: Asks Questions Perception: Coherent Willingness to Engage in Self- High Management Activities: Readiness to Engage in Self- High Management Activities: Electronic Signature(s) Signed: 08/08/2018 4:52:40 PM By: Montey Hora Entered By: Montey Hora on 08/08/2018 09:49:16 DEMARIUS, ARCHILA ([id number]) -------------------------------------------------------------------------------- Fall Risk Assessment Details Patient  Name: [name] Date of Service: 08/08/2018 8:45 AM Medical Record Number: [id number] Patient Account Number: [id number] Date of Birth/Sex: 12/14/1945 (72 y.o. M) Treating RN: Montey Hora Primary Care Cristhian Vanhook: [name] Other Clinician: Referring Shyia Fillingim: Referral, Self Treating Antwon Rochin/Extender: Melburn Hake, HOYT Weeks in Treatment: 0 Fall Risk Assessment Items Have you had 2 or more falls in the last 12 monthso 0 No Have you had any fall that resulted in injury in the last 12 monthso 0 No FALL RISK ASSESSMENT: History of falling - immediate or within 3 months 0 No Secondary diagnosis 0 No Ambulatory aid None/bed rest/wheelchair/nurse 0 No Crutches/cane/walker 0 No Furniture 0 No IV Access/Saline Lock 0 No Gait/Training Normal/bed rest/immobile  0 No Weak 0 No Impaired 0 No Mental Status Oriented to own ability 0 Yes Electronic Signature(s) Signed: 08/08/2018 4:52:40 PM By: Montey Hora Entered By: Montey Hora on 08/08/2018 09:49:29 [name] ([id number]) -------------------------------------------------------------------------------- Foot Assessment Details Patient Name: [name] Date of Service: 08/08/2018 8:45 AM Medical Record Number: [id number] Patient Account Number: [id number] Date of Birth/Sex: 21-Sep-1945 (72 y.o. M) Treating RN: Montey Hora Primary Care An Schnabel: [name] Other Clinician: Referring Anaalicia Reimann: Referral, Self Treating Kypton Eltringham/Extender: STONE III, HOYT Weeks in Treatment: 0 Foot Assessment Items Site Locations + = Sensation present, - = Sensation absent, C = Callus, U = Ulcer R = Redness, W = Warmth, M = Maceration, PU = Pre-ulcerative lesion F = Fissure, S = Swelling, D = Dryness Assessment Right: Left: Other Deformity: No No Prior Foot Ulcer: No No Prior Amputation: No No Charcot Joint: No No Ambulatory Status: Ambulatory Without Help Gait: Steady Electronic Signature(s) Signed:  08/08/2018 4:52:40 PM By: Montey Hora Entered By: Montey Hora on 08/08/2018 09:51:59 [name] ([id number]) -------------------------------------------------------------------------------- Nutrition Risk Assessment Details Patient Name: [name] Date of Service: 08/08/2018 8:45 AM Medical Record Number: [id number] Patient Account Number: [id number] Date of Birth/Sex: 11-03-1945 (72 y.o. M) Treating RN: Montey Hora Primary Care Keniyah Gelinas: [name] Other Clinician: Referring Sosaia Pittinger: Referral, Self Treating Jannet Calip/Extender: STONE III, HOYT Weeks in Treatment: 0 Height (in): 68 Weight (lbs): 186.1 Body Mass Index (BMI): 28.3 Nutrition Risk Assessment Items NUTRITION RISK SCREEN: I have an illness or condition that made me change the kind and/or amount of 0 No food I eat I eat fewer than two meals per day 0 No I eat few fruits and vegetables, or milk products 0 No I have three or more drinks of beer, liquor or wine almost every day 0 No I have tooth or mouth problems that make it hard for me to eat 0 No I don't always have enough money to buy the food I need 0 No I eat alone most of the time 0 No I take three or more different prescribed or over-the-counter drugs a day 1 Yes Without wanting to, I have lost or gained 10 pounds in the last six months 0 No I am not always physically able to shop, cook and/or feed myself 0 No Nutrition Protocols Good Risk Protocol Moderate Risk Protocol Electronic Signature(s) Signed: 08/08/2018 4:52:40 PM By: Montey Hora Entered By: Montey Hora on 08/08/2018 09:50:40

## 2018-08-10 NOTE — Progress Notes (Signed)
SMITTY, ACKERLEY ([id number]) Visit Report for 08/08/2018 Chief Complaint Document Details Patient Name: TREK, KIMBALL Date of Service: 08/08/2018 8:45 AM Medical Record Number: [id number] Patient Account Number: [id number] Date of Birth/Sex: 01/21/1946 (72 y.o. M) Treating RN: Montey Hora Primary Care Provider: [name] Other Clinician: Referring Provider: Referral, Self Treating Provider/Extender: Melburn Hake, Amandeep Hogston Weeks in Treatment: 0 Information Obtained from: Patient Chief Complaint Left LE ulcer Electronic Signature(s) Signed: 08/08/2018 5:24:21 PM By: Worthy Keeler PA-C Entered By: Worthy Keeler on 08/08/2018 09:46:11 JOMARI, BARTNIK ([id number]) -------------------------------------------------------------------------------- Debridement Details Patient Name: [name] Date of Service: 08/08/2018 8:45 AM Medical Record Number: [id number] Patient Account Number: [id number] Date of Birth/Sex: 30-Oct-1945 (72 y.o. M) Treating RN: Montey Hora Primary Care Provider: [name] Other Clinician: Referring Provider: Referral, Self Treating Provider/Extender: STONE III, Tayte Mcwherter Weeks in Treatment: 0 Debridement Performed for Wound #3 Left,Anterior Lower Leg Assessment: Performed By: Physician STONE III, Rene Sizelove E., PA-C Debridement Type: Debridement Severity of Tissue Pre Fat layer exposed Debridement: Level of Consciousness (Pre- Awake and Alert procedure): Pre-procedure Verification/Time Yes - 10:06 Out Taken: Start Time: 10:06 Pain Control: Lidocaine 4% Topical Solution Total Area Debrided (L x W): 5.8 (cm) x 0.9 (cm) = 5.22 (cm) Tissue and other material Viable, Non-Viable, Blood Clots, Slough, Subcutaneous, Skin: Dermis , Skin: Epidermis, debrided: Slough Level: Skin/Subcutaneous Tissue Debridement Description: Excisional Instrument: Forceps, Scissors Bleeding: Moderate Hemostasis Achieved: Silver Nitrate End Time:  10:14 Procedural Pain: 0 Post Procedural Pain: 0 Response to Treatment: Procedure was tolerated well Level of Consciousness Awake and Alert (Post-procedure): Post Debridement Measurements of Total Wound Length: (cm) 5.6 Width: (cm) 2.3 Depth: (cm) 0.2 Volume: (cm) 2.023 Character of Wound/Ulcer Post Debridement: Improved Severity of Tissue Post Debridement: Fat layer exposed Post Procedure Diagnosis Same as Pre-procedure Electronic Signature(s) Signed: 08/08/2018 4:52:40 PM By: Montey Hora Signed: 08/08/2018 5:24:21 PM By: Worthy Keeler PA-C Entered By: Montey Hora on 08/08/2018 10:20:19 [name] ([id number]) -------------------------------------------------------------------------------- HPI Details Patient Name: [name] Date of Service: 08/08/2018 8:45 AM Medical Record Number: [id number] Patient Account Number: [id number] Date of Birth/Sex: 02-Jul-1946 (72 y.o. M) Treating RN: Montey Hora Primary Care Provider: [name] Other Clinician: Referring Provider: Referral, Self Treating Provider/Extender: Melburn Hake, Hill Mackie Weeks in Treatment: 0 History of Present Illness HPI Description: 72 year old gentleman who was recently seen by his nephrologist [name], and noted to have a wound on his left lower extremity which was lacerated 2 months ago and now has reopened. The patient's left shin has a ulceration with some exudate but no evidence of infection and he was referred to [place] for further care as it was known that the patient has had some peripheral vascular disease in the past. Past medical history significant for chronic kidney disease, atrial fibrillation, diabetes mellitus,status post kidney transplant in 1983 and 2005, a week fistula graft placement, status post previous bowel surgery. 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 10/06/2015 -- x-ray of the left tibia and fibula shows no evidence  of osteomyelitis. The patient has also had Doppler studies of his extremity and is awaiting the appointment with the vascular surgeon. We have not yet received these reports. 10/13/2015 -- lower extremity venous duplex reflux evaluation shows reflux in the left common femoral vein, left saphenofemoral junction and the proximal greater saphenous vein extending to the proximal calf. There is also reflux in the left proximal to mid small saphenous vein. Arterial duplex studies done showed the resting  ABI was not applicable due to tibial artery medial calcification. 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 The right TBI was 1 which is normal and the left ABI was 1 which is normal. Patient has otherwise been doing fine and has been compliant with his dressings. 10/20/2015 -- He was seen by [name] recently for a vascular opinion on 10/15/2015. His left lower extremity venous insufficiency duplex study revealed GSV reflux,SS vein reflux and deep venous reflux in the common femoral vein. His ABIs were non compressible and his TBI on the right was 1.01 and on the left was 0.80. He was asked to continue with the wound care with compressive therapy followed by EVLA of the left GS vein 3 months. He recommended 20-30 mm thigh-high compression stockings and the need for a three-month trial of this. The patient had an Unna boot applied at the vascular office but he could not tolerate this with a lot of pain and issues with his toes and hence came here on Friday for removal of this and we reapplied a 2 layer compression. 11/10/2015 -- patient still has not purchased his 20-30 mm thigh-high compression stockings as prescribed by [name]. Readmission: 08/08/18 on evaluation today patient presents for readmission concerning a new injury to the left anterior lower extremity. He was  previously seen in 2017 here in our clinic. He states that he has done fairly well since that point. Nonetheless he is having at this time some pain but states that he hit this on a table that fell over and actually struck his leg. This appears to have pulled back some of his skin which folded in on itself and is causing some difficulty as far as that is concerned. There does not appear to be any evidence of infection at this time. No fevers, chills, nausea, or vomiting noted at this time. He's been using dressings on his own currently without complication. Electronic Signature(s) Signed: 08/08/2018 5:24:21 PM By: Worthy Keeler PA-C Entered By: Worthy Keeler on 08/08/2018 10:46:09 HEYWOOD, TOKUNAGA ([id number]) -------------------------------------------------------------------------------- Physical Exam Details Patient Name: [name] Date of Service: 08/08/2018 8:45 AM Medical Record Number: [id number] Patient Account Number: [id number] Date of Birth/Sex: 17-Apr-1946 (72 y.o. M) Treating RN: Montey Hora Primary Care Provider: [name] Other Clinician: Referring Provider: Referral, Self Treating Provider/Extender: STONE III, Jenice Leiner Weeks in Treatment: 0 Constitutional sitting or standing blood pressure is within target range for patient.. pulse regular and within target range for patient.Marland Kitchen respirations regular, non-labored and within target range for patient.Marland Kitchen temperature within target range for patient.. Well- nourished and well-hydrated in no acute distress. Eyes conjunctiva clear no eyelid edema noted. pupils equal round and reactive to light and accommodation. Ears, Nose, Mouth, and Throat no gross abnormality of ear auricles or external auditory canals. normal hearing noted during conversation. mucus membranes moist. Respiratory normal breathing without difficulty. clear to auscultation bilaterally. Cardiovascular regular rate and rhythm with normal S1, S2. 2+  dorsalis pedis/posterior tibialis pulses. no clubbing, cyanosis, significant edema, <3 sec cap refill. Gastrointestinal (GI) soft, non-tender, non-distended, +BS. no ventral hernia noted. Musculoskeletal normal gait and posture. no significant deformity or arthritic changes, no loss or range of motion, no clubbing. Psychiatric this patient is able to make decisions and demonstrates good insight into disease process. Alert and Oriented x 3. pleasant and cooperative. Notes On inspection today patient's wound bed actually shows  evidence of a skin flap on the medial aspect of the wound opening which is folded in on itself and is going to need to be removed in order to allow this area to appropriately heal. Softly there was some Slough noted on the surface of the wound as well. Patient's wound was actually debrided both with use of scissors and forceps as well as a tear at to remove the necrotic tissue from the surface of the wound as well as the necrotic skin flap that was pulled back and required debridement away. He tolerated this with minimal discomfort fortunately he was able to handle the debridement without any complication we did utilize topical numbing agents. We discussed the possibility of utilizing injectable lidocaine although the patient was not interested in doing that currently. Post debridement the wound bed appears to be doing much better some silver nitrate was required to chemically cauterize and seal up the bleeding post debridement. Electronic Signature(s) Signed: 08/08/2018 5:24:21 PM By: Worthy Keeler PA-C Entered By: Worthy Keeler on 08/08/2018 10:47:03 EMRY, BARBATO ([id number]) -------------------------------------------------------------------------------- Physician Orders Details Patient Name: [name] Date of Service: 08/08/2018 8:45 AM Medical Record Number: [id number] Patient Account Number: [id number] Date of Birth/Sex: 1945-11-01 (72 y.o.  M) Treating RN: Montey Hora Primary Care Provider: [name] Other Clinician: Referring Provider: Referral, Self Treating Provider/Extender: Melburn Hake, Tykeria Wawrzyniak Weeks in Treatment: 0 Verbal / Phone Orders: No Diagnosis Coding ICD-10 Coding Code Description E11.622 Type 2 diabetes mellitus with other skin ulcer L97.822 Non-pressure chronic ulcer of other part of left lower leg with fat layer exposed I73.9 Peripheral vascular disease, unspecified I87.312 Chronic venous hypertension (idiopathic) with ulcer of left lower extremity Z94.0 Kidney transplant status Wound Cleansing Wound #3 Left,Anterior Lower Leg o Cleanse wound with mild soap and water o May shower with protection. - Please do not get your wrap wet and call us if you do get it wet - [phone] Anesthetic (add to Medication List) Wound #3 Left,Anterior Lower Leg o Topical Lidocaine 4% cream applied to wound bed prior to debridement (In Clinic Only). Primary Wound Dressing Wound #3 Left,Anterior Lower Leg o Silver Alginate Secondary Dressing Wound #3 Left,Anterior Lower Leg o ABD pad Dressing Change Frequency Wound #3 Left,Anterior Lower Leg o Change dressing every week Follow-up Appointments Wound #3 Left,Anterior Lower Leg o Return Appointment in 1 week. Edema Control Wound #3 Left,Anterior Lower Leg o Kerlix and Coban - Left Lower Extremity Additional Orders / Instructions Wound #3 Left,Anterior Lower Leg o OK to return to work [place], Tammi Klippel ([id number]) o Activity as tolerated Electronic Signature(s) Signed: 08/08/2018 4:52:40 PM By: Montey Hora Signed: 08/08/2018 5:24:21 PM By: Worthy Keeler PA-C Entered By: Montey Hora on 08/08/2018 10:15:39 MELVILLE, ENGEN ([id number]) -------------------------------------------------------------------------------- Problem List Details Patient Name: [name] Date of Service: 08/08/2018 8:45 AM Medical Record Number:  [id number] Patient Account Number: [id number] Date of Birth/Sex: Sep 22, 1945 (72 y.o. M) Treating RN: Montey Hora Primary Care Provider: [name] Other Clinician: Referring Provider: Referral, Self Treating Provider/Extender: Melburn Hake, Jaxn Chiquito Weeks in Treatment: 0 Active Problems ICD-10 Evaluated Encounter Code Description Active Date Today Diagnosis E11.622 Type 2 diabetes mellitus with other skin ulcer 08/08/2018 No Yes L97.822 Non-pressure chronic ulcer of other part of left lower leg with 08/08/2018 No Yes fat layer exposed I73.9 Peripheral vascular disease, unspecified 08/08/2018 No Yes I87.312 Chronic venous hypertension (idiopathic) with ulcer of left 08/08/2018 No Yes lower extremity Z94.0 Kidney transplant status 08/08/2018 No Yes Inactive Problems Resolved Problems Electronic Signature(s)  Signed: 08/08/2018 5:24:21 PM By: Worthy Keeler PA-C Entered By: Worthy Keeler on 08/08/2018 09:45:58 [name] ([id number]) -------------------------------------------------------------------------------- Progress Note Details Patient Name: [name] Date of Service: 08/08/2018 8:45 AM Medical Record Number: [id number] Patient Account Number: [id number] Date of Birth/Sex: 01-29-1946 (72 y.o. M) Treating RN: Montey Hora Primary Care Provider: [name] Other Clinician: Referring Provider: Referral, Self Treating Provider/Extender: Melburn Hake, Zeshan Sena Weeks in Treatment: 0 Subjective Chief Complaint Information obtained from Patient Left LE ulcer History of Present Illness (HPI) 72 year old gentleman who was recently seen by his nephrologist [name], and noted to have a wound on his left lower extremity which was lacerated 2 months ago and now has reopened. The patient's left shin has a ulceration with some exudate but no evidence of infection and he was referred to [place] for further care as it was known that the patient has had some  peripheral vascular disease in the past. Past medical history significant for chronic kidney disease, atrial fibrillation, diabetes mellitus,status post kidney transplant in 1983 and 2005, a week fistula graft placement, status post previous bowel surgery. 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 10/06/2015 -- x-ray of the left tibia and fibula shows no evidence of osteomyelitis. The patient has also had Doppler studies of his extremity and is awaiting the appointment with the vascular surgeon. We have not yet received these reports. 10/13/2015 -- lower extremity venous duplex reflux evaluation shows reflux in the left common femoral vein, left saphenofemoral junction and the proximal greater saphenous vein extending to the proximal calf. There is also reflux in the left proximal to mid small saphenous vein. Arterial duplex studies done showed the resting ABI was not applicable due to tibial artery medial calcification. 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 The right TBI was 1 which is normal and the left ABI was 1 which is normal. Patient has otherwise been doing fine and has been compliant with his dressings. 10/20/2015 -- He was seen by [name] recently for a vascular opinion on 10/15/2015. His left lower extremity venous insufficiency duplex study revealed GSV reflux,SS vein reflux and deep venous reflux in the common femoral vein. His ABIs were non compressible and his TBI on the right was 1.01 and on the left was 0.80. He was asked to continue with the wound care with compressive therapy followed by EVLA of the left GS vein 3 months. He recommended 20-30 mm thigh-high compression stockings and the need for a three-month trial of this. The patient had an Unna boot applied at the vascular office but he could not tolerate  this with a lot of pain and issues with his toes and hence came here on Friday for removal of this and we reapplied a 2 layer compression. 11/10/2015 -- patient still has not purchased his 20-30 mm thigh-high compression stockings as prescribed by [name]. Readmission: 08/08/18 on evaluation today patient presents for readmission concerning a new injury to the left anterior lower extremity. He was previously seen in 2017 here in our clinic. He states that he has done fairly well since that point. Nonetheless he is having at this time some pain but states that he hit this on a table that fell over and actually struck his leg. This appears to have pulled back some of his skin which folded  in on itself and is causing some difficulty as far as that is concerned. There does not appear to be any evidence of infection at this time. No fevers, chills, nausea, or vomiting noted at this time. He's been using dressings on his own currently without complication. Wound History Patient presents with 1 open wound that has been present for approximately 1 week. Patient has been treating wound in the [place] ([id number]) following manner: wwrapping compression, cleaning. Laboratory tests have not been performed in the last month. Patient reportedly has not tested positive for an antibiotic resistant organism. Patient reportedly has not tested positive for osteomyelitis. Patient experiences the following problems associated with their wounds: swelling. Patient History Information obtained from Patient. Allergies seasonal allergies, Elavil (Severity: Moderate, Reaction: disorientated) Family History Hypertension - Maternal Grandparents, Kidney Disease - Maternal Grandparents, Stroke - Maternal Grandparents, No family history of Cancer, Diabetes, Heart Disease, Hereditary Spherocytosis, Lung Disease, Seizures, Thyroid Problems, Tuberculosis. Social History Former smoker - teen years, Marital Status -  Married, Alcohol Use - Never - hx drinking on weekends, Drug Use - No History, Caffeine Use - Daily. Medical History Eyes Denies history of Optic Neuritis Hematologic/Lymphatic Denies history of Hemophilia, Human Immunodeficiency Virus, Lymphedema, Sickle Cell Disease Respiratory Denies history of Aspiration, Asthma, Chronic Obstructive Pulmonary Disease (COPD), Pneumothorax, Sleep Apnea, Tuberculosis Cardiovascular Patient has history of Congestive Heart Failure, Hypertension Denies history of Angina, Coronary Artery Disease, Deep Vein Thrombosis, Hypotension, Myocardial Infarction, Peripheral Arterial Disease, Peripheral Venous Disease, Phlebitis, Vasculitis Endocrine Denies history of Type I Diabetes Genitourinary Denies history of End Stage Renal Disease Immunological Denies history of Lupus Erythematosus, Raynaud s, Scleroderma Integumentary (Skin) Denies history of History of Burn, History of pressure wounds Musculoskeletal Denies history of Gout, Rheumatoid Arthritis, Osteomyelitis Neurologic Denies history of Dementia, Quadriplegia, Paraplegia, Seizure Disorder Oncologic Denies history of Received Chemotherapy, Received Radiation Psychiatric Denies history of Anorexia/bulimia, Confinement Anxiety Patient is treated with Insulin, Oral Agents. Blood sugar is tested. Hospitalization/Surgery History - 07/09/2004, [facility] in [place], Kidney Transplant. Medical And Surgical History Notes Genitourinary pt had kidney transplant in 2005; CKD Review of Systems (ROS) Eyes The patient has no complaints or symptoms. GORDON, VANDUNK ([id number]) Hematologic/Lymphatic The patient has no complaints or symptoms. Respiratory Denies complaints or symptoms of Chronic or frequent coughs, Shortness of Breath. Cardiovascular The patient has no complaints or symptoms. Endocrine The patient has no complaints or symptoms. Genitourinary Complains or has symptoms of Kidney failure/  Dialysis - CKD. Denies complaints or symptoms of Incontinence/dribbling. Immunological Denies complaints or symptoms of Hives, Itching. Integumentary (Skin) Complains or has symptoms of Wounds. Denies complaints or symptoms of Bleeding or bruising tendency, Breakdown, Swelling. Musculoskeletal The patient has no complaints or symptoms. Neurologic The patient has no complaints or symptoms. Psychiatric Denies complaints or symptoms of Anxiety, Claustrophobia. Objective Constitutional sitting or standing blood pressure is within target range for patient.. pulse regular and within target range for patient.Marland Kitchen respirations regular, non-labored and within target range for patient.Marland Kitchen temperature within target range for patient.. Well- nourished and well-hydrated in no acute distress. Vitals Time Taken: 9:00 AM, Height: 68 in, Source: Stated, Weight: 186.1 lbs, Source: Measured, BMI: 28.3, Temperature: 98.3 F, Pulse: 52 bpm, Respiratory Rate: 16 breaths/min, Blood Pressure: 132/71 mmHg. Eyes conjunctiva clear no eyelid edema noted. pupils equal round and reactive to light and accommodation. Ears, Nose, Mouth, and Throat no gross abnormality of ear auricles or external auditory canals. normal hearing noted during conversation. mucus membranes moist. Respiratory normal breathing without difficulty. clear  to auscultation bilaterally. Cardiovascular regular rate and rhythm with normal S1, S2. 2+ dorsalis pedis/posterior tibialis pulses. no clubbing, cyanosis, significant edema, Gastrointestinal (GI) soft, non-tender, non-distended, +BS. no ventral hernia noted. Musculoskeletal XAVIAN, HARDCASTLE ([id number]) normal gait and posture. no significant deformity or arthritic changes, no loss or range of motion, no clubbing. Psychiatric this patient is able to make decisions and demonstrates good insight into disease process. Alert and Oriented x 3. pleasant and cooperative. General Notes: On  inspection today patient's wound bed actually shows evidence of a skin flap on the medial aspect of the wound opening which is folded in on itself and is going to need to be removed in order to allow this area to appropriately heal. Softly there was some Slough noted on the surface of the wound as well. Patient's wound was actually debrided both with use of scissors and forceps as well as a tear at to remove the necrotic tissue from the surface of the wound as well as the necrotic skin flap that was pulled back and required debridement away. He tolerated this with minimal discomfort fortunately he was able to handle the debridement without any complication we did utilize topical numbing agents. We discussed the possibility of utilizing injectable lidocaine although the patient was not interested in doing that currently. Post debridement the wound bed appears to be doing much better some silver nitrate was required to chemically cauterize and seal up the bleeding post debridement. Integumentary (Hair, Skin) Wound #3 status is Open. Original cause of wound was Trauma. The wound is located on the Left,Anterior Lower Leg. The wound measures 5.8cm length x 0.9cm width x 0.1cm depth; 4.1cm^2 area and 0.41cm^3 volume. There is Fat Layer (Subcutaneous Tissue) Exposed exposed. There is no tunneling or undermining noted. There is a small amount of serosanguineous drainage noted. The wound margin is flat and intact. There is small (1-33%) pale granulation within the wound bed. There is a medium (34-66%) amount of necrotic tissue within the wound bed including Adherent Slough. The periwound skin appearance exhibited: Hemosiderin Staining. The periwound skin appearance did not exhibit: Callus, Crepitus, Excoriation, Induration, Rash, Scarring, Dry/Scaly, Maceration, Atrophie Blanche, Cyanosis, Ecchymosis, Mottled, Pallor, Rubor, Erythema. Assessment Active Problems ICD-10 Type 2 diabetes mellitus with other  skin ulcer Non-pressure chronic ulcer of other part of left lower leg with fat layer exposed Peripheral vascular disease, unspecified Chronic venous hypertension (idiopathic) with ulcer of left lower extremity Kidney transplant status Procedures Wound #3 Pre-procedure diagnosis of Wound #3 is a Diabetic Wound/Ulcer of the Lower Extremity located on the Left,Anterior Lower Leg .Severity of Tissue Pre Debridement is: Fat layer exposed. There was a Excisional Skin/Subcutaneous Tissue Debridement with a total area of 5.22 sq cm performed by STONE III, Emmary Culbreath E., PA-C. With the following instrument(s): Forceps, and Scissors to remove Viable and Non-Viable tissue/material. Material removed includes Blood Clots, Subcutaneous Tissue, Slough, Skin: Dermis, and Skin: Epidermis after achieving pain control using Lidocaine 4% Topical Solution. No specimens were taken. A time out was conducted at 10:06, prior to the start of the procedure. A Moderate amount of bleeding was controlled with Silver Nitrate. The procedure was tolerated well with a pain level of 0 throughout and a pain level of 0 following the procedure. Post Debridement Measurements: 5.6cm length x 2.3cm width x 0.2cm depth; 2.023cm^3 volume. MESSI, TWEDT ([id number]) Character of Wound/Ulcer Post Debridement is improved. Severity of Tissue Post Debridement is: Fat layer exposed. Post procedure Diagnosis Wound #3: Same as Pre-Procedure Plan Wound Cleansing: Wound #  3 Left,Anterior Lower Leg: Cleanse wound with mild soap and water May shower with protection. - Please do not get your wrap wet and call us if you do get it wet - [phone] Anesthetic (add to Medication List): Wound #3 Left,Anterior Lower Leg: Topical Lidocaine 4% cream applied to wound bed prior to debridement (In Clinic Only). Primary Wound Dressing: Wound #3 Left,Anterior Lower Leg: Silver Alginate Secondary Dressing: Wound #3 Left,Anterior Lower Leg: ABD  pad Dressing Change Frequency: Wound #3 Left,Anterior Lower Leg: Change dressing every week Follow-up Appointments: Wound #3 Left,Anterior Lower Leg: Return Appointment in 1 week. Edema Control: Wound #3 Left,Anterior Lower Leg: Kerlix and Coban - Left Lower Extremity Additional Orders / Instructions: Wound #3 Left,Anterior Lower Leg: OK to return to work Activity as tolerated At this point my suggestion is going to be that we go ahead and initiate the above wound care measures for the next week. The patient is in agreement with the plan. I hope that this would heal quite well and rapidly. Again I explained to the patient however that I cannot ensure that he will absolutely be healed by the end of the year although that is a strong possibility if he feels well. We will otherwise see were things stand at follow-up next week. Please see above for specific wound care orders. We will see patient for re-evaluation in 1 week(s) here in the clinic. If anything worsens or changes patient will contact our office for additional recommendations. Electronic Signature(s) Signed: 08/08/2018 5:24:21 PM By: Worthy Keeler PA-C Entered By: Worthy Keeler on 08/08/2018 10:48:02 JUWAUN, INSKEEP ([id number]) -------------------------------------------------------------------------------- ROS/PFSH Details Patient Name: [name] Date of Service: 08/08/2018 8:45 AM Medical Record Number: [id number] Patient Account Number: [id number] Date of Birth/Sex: [birth year] (72 y.o. M) Treating RN: Montey Hora Primary Care Provider: [name] Other Clinician: Referring Provider: Referral, Self Treating Provider/Extender: STONE III, Briana Farner Weeks in Treatment: 0 Information Obtained From Patient Wound History Do you currently have one or more open woundso Yes How many open wounds do you currently haveo 1 Approximately how long have you had your woundso 1 week How have you been treating your  wound(s) until nowo wwrapping compression, cleaning Has your wound(s) ever healed and then re-openedo No Have you had any lab work done in the past montho No Have you tested positive for an antibiotic resistant organism (MRSA, VRE)o No Have you tested positive for osteomyelitis (bone infection)o No Have you had other problems associated with your woundso Swelling Eyes Complaints and Symptoms: No Complaints or Symptoms Complaints and Symptoms: Negative for: Dry Eyes; Vision Changes; Glasses / Contacts Medical History: Positive for: Cataracts; Glaucoma - had sugery Negative for: Optic Neuritis Hematologic/Lymphatic Complaints and Symptoms: No Complaints or Symptoms Complaints and Symptoms: Negative for: Bleeding / Clotting Disorders; Human Immunodeficiency Virus Medical History: Positive for: Anemia Negative for: Hemophilia; Human Immunodeficiency Virus; Lymphedema; Sickle Cell Disease Respiratory Complaints and Symptoms: Negative for: Chronic or frequent coughs; Shortness of Breath Medical History: Negative for: Aspiration; Asthma; Chronic Obstructive Pulmonary Disease (COPD); Pneumothorax; Sleep Apnea; Tuberculosis Cardiovascular Complaints and Symptoms: No Complaints or Symptoms Shoultz, Froylan ([id number]) Complaints and Symptoms: Negative for: Chest pain; LE edema Medical History: Positive for: Arrhythmia - a-fib; Congestive Heart Failure; Hypertension Negative for: Angina; Coronary Artery Disease; Deep Vein Thrombosis; Hypotension; Myocardial Infarction; Peripheral Arterial Disease; Peripheral Venous Disease; Phlebitis; Vasculitis Endocrine Complaints and Symptoms: No Complaints or Symptoms Complaints and Symptoms: Negative for: Hepatitis; Thyroid disease; Polydypsia (Excessive Thirst) Medical History: Positive for: Type  II Diabetes Negative for: Type I Diabetes Time with diabetes: 2005 Treated with: Insulin, Oral agents Blood sugar tested every day: Yes Tested :  3 times a day Genitourinary Complaints and Symptoms: Positive for: Kidney failure/ Dialysis - CKD Negative for: Incontinence/dribbling Medical History: Negative for: End Stage Renal Disease Past Medical History Notes: pt had kidney transplant in 2005; CKD Immunological Complaints and Symptoms: Negative for: Hives; Itching Medical History: Negative for: Lupus Erythematosus; Raynaudos; Scleroderma Integumentary (Skin) Complaints and Symptoms: Positive for: Wounds Negative for: Bleeding or bruising tendency; Breakdown; Swelling Medical History: Negative for: History of Burn; History of pressure wounds Musculoskeletal Complaints and Symptoms: No Complaints or Symptoms Complaints and Symptoms: Negative for: Muscle Pain; Muscle Weakness Vincent, Weyman ([id number]) Medical History: Positive for: Osteoarthritis Negative for: Gout; Rheumatoid Arthritis; Osteomyelitis Neurologic Complaints and Symptoms: No Complaints or Symptoms Complaints and Symptoms: Negative for: Numbness/parasthesias; Focal/Weakness Medical History: Positive for: Neuropathy Negative for: Dementia; Quadriplegia; Paraplegia; Seizure Disorder Psychiatric Complaints and Symptoms: Negative for: Anxiety; Claustrophobia Medical History: Negative for: Anorexia/bulimia; Confinement Anxiety Oncologic Medical History: Negative for: Received Chemotherapy; Received Radiation HBO Extended History Items Eyes: Eyes: Cataracts Glaucoma Immunizations Pneumococcal Vaccine: Received Pneumococcal Vaccination: Yes Tetanus Vaccine: Last tetanus shot: 08/02/2018 Implantable Devices Hospitalization / Surgery History Name of Hospital Purpose of Hospitalization/Surgery Date [facility] in [place] Kidney Transplant 07/09/2004 Family and Social History Cancer: No; Diabetes: No; Heart Disease: No; Hereditary Spherocytosis: No; Hypertension: Yes - Maternal Grandparents; Kidney Disease: Yes - Maternal Grandparents; Lung  Disease: No; Seizures: No; Stroke: Yes - Maternal Grandparents; Thyroid Problems: No; Tuberculosis: No; Former smoker - teen years; Marital Status - Married; Alcohol Use: Never - hx drinking on weekends; Drug Use: No History; Caffeine Use: Daily; Financial Concerns: No; Food, Clothing or Shelter Needs: No; Support System Lacking: No; Transportation Concerns: No; Advanced Directives: No; Patient does not want information on Advanced Directives; Do not resuscitate: No; Living Will: No; Medical Power of Attorney: No Electronic Signature(s) Signed: 08/08/2018 4:52:40 PM By: Montey Hora Signed: 08/08/2018 5:24:21 PM By: Leane Call, Savva ([id number]) Entered By: Montey Hora on 08/08/2018 10:02:25 ZACH, TIETJE ([id number]) -------------------------------------------------------------------------------- SuperBill Details Patient Name: [name] Date of Service: 08/08/2018 Medical Record Number: [id number] Patient Account Number: [id number] Date of Birth/Sex: [birth year] (72 y.o. 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 Z94.0 Kidney transplant status Facility Procedures CPT4 Code Description: 43154008 99213 - WOUND CARE VISIT-LEV 3 EST PT Modifier: Quantity: 1 CPT4 Code Description: [id number] 11042 - DEB SUBQ TISSUE 20 SQ CM/< ICD-10 Diagnosis Description L97.822 Non-pressure chronic ulcer of other part of left lower leg with Modifier: fat layer expos Quantity: 1 ed Physician Procedures CPT4  Code Description: [id number] 99214 - WC PHYS LEVEL 4 - EST PT ICD-10 Diagnosis Description E11.622 Type 2 diabetes mellitus with other skin ulcer L97.822 Non-pressure chronic ulcer of other part of left lower leg with I73.9 Peripheral vascular disease,  unspecified I87.312 Chronic venous hypertension (idiopathic) with ulcer of left low Modifier: 25 fat layer expos er extremity Quantity: 1 ed CPT4 Code Description: [id number] 11042 - WC PHYS SUBQ TISS 20 SQ CM ICD-10 Diagnosis Description L97.822 Non-pressure chronic ulcer  of other part of left lower leg with Modifier: fat layer expos Quantity: 1 ed Electronic Signature(s) Signed: 08/08/2018 5:24:21 PM By: Worthy Keeler PA-C Entered By: Worthy Keeler on 08/08/2018 10:48:23

## 2018-08-11 NOTE — Progress Notes (Signed)
DEVERY, MURGIA ([id number]) Visit Report for 08/08/2018 Allergy List Details Patient Name: [name], [name] Date of Service: 08/08/2018 8:45 AM Medical Record Number: [id number] Patient Account Number: [id number] Date of Birth/Sex: [birth year] (73 y.o. M) Treating RN: Montey Hora Primary Care Norita Meigs: [name] Other Clinician: Referring Halana Deisher: Referral, Self Treating Edel Rivero/Extender: STONE III, HOYT Weeks in Treatment: 0 Allergies Active Allergies seasonal allergies Elavil Reaction: disorientated Severity: Moderate Allergy Notes Electronic Signature(s) Signed: 08/08/2018 4:52:40 PM By: Montey Hora Entered By: Montey Hora on 08/08/2018 09:45:51 [name] ([id number]) -------------------------------------------------------------------------------- Arrival Information Details Patient Name: [name] Date of Service: 08/08/2018 8:45 AM Medical Record Number: [id number] Patient Account Number: [id number] Date of Birth/Sex: 01-19-1946 (72 y.o. M) Treating RN: Montey Hora Primary Care Yong Grieser: [name] Other Clinician: Referring Maisie Hauser: Referral, Self Treating Rayaan Garguilo/Extender: Melburn Hake, HOYT Weeks in Treatment: 0 Visit Information Patient Arrived: Ambulatory Arrival Time: 08:59 Accompanied By: self Transfer Assistance: None Patient Identification Verified: Yes Secondary Verification Process Completed: Yes Patient Has Alerts: Yes Patient Alerts: DMII aspirin 81 History Since Last Visit Added or deleted any medications: No Any new allergies or adverse reactions: No Had a fall or experienced change in activities of daily living that may affect risk of falls: No Signs or symptoms of abuse/neglect since last visito No Hospitalized since last visit: No Implantable device outside of the clinic excluding cellular tissue based products placed in the center since last visit: No Electronic Signature(s) Signed: 08/08/2018 4:52:40  PM By: Montey Hora Entered By: Montey Hora on 08/08/2018 09:54:48 [name] ([id number]) -------------------------------------------------------------------------------- Clinic Level of Care Assessment Details Patient Name: [name] Date of Service: 08/08/2018 8:45 AM Medical Record Number: [id number] Patient Account Number: [id number] Date of Birth/Sex: 1946-05-09 (72 y.o. M) Treating RN: Montey Hora Primary Care Apryle Stowell: [name] Other Clinician: Referring Deonte Otting: Referral, Self Treating Laurelyn Terrero/Extender: STONE III, HOYT Weeks in Treatment: 0 Clinic Level of Care Assessment Items TOOL 1 Quantity Score []  - Use when EandM and Procedure is performed on INITIAL visit 0 ASSESSMENTS - Nursing Assessment / Reassessment X - General Physical Exam (combine w/ comprehensive assessment (listed just below) when 1 20 performed on new pt. evals) X- 1 25 Comprehensive Assessment (HX, ROS, Risk Assessments, Wounds Hx, etc.) ASSESSMENTS - Wound and Skin Assessment / Reassessment []  - Dermatologic / Skin Assessment (not related to wound area) 0 ASSESSMENTS - Ostomy and/or Continence Assessment and Care []  - Incontinence Assessment and Management 0 []  - 0 Ostomy Care Assessment and Management (repouching, etc.) PROCESS - Coordination of Care X - Simple Patient / Family Education for ongoing care 1 15 []  - 0 Complex (extensive) Patient / Family Education for ongoing care X- 1 10 Staff obtains Programmer, systems, Records, Test Results / Process Orders []  - 0 Staff telephones HHA, Nursing Homes / Clarify orders / etc []  - 0 Routine Transfer to another Facility (non-emergent condition) []  - 0 Routine Hospital Admission (non-emergent condition) X- 1 15 New Admissions / Biomedical engineer / Ordering NPWT, Apligraf, etc. []  - 0 Emergency Hospital Admission (emergent condition) PROCESS - Special Needs []  - Pediatric / Minor Patient Management 0 []  - 0 Isolation  Patient Management []  - 0 Hearing / Language / Visual special needs []  - 0 Assessment of Community assistance (transportation, D/C planning, etc.) []  - 0 Additional assistance / Altered mentation []  - 0 Support Surface(s) Assessment (bed, cushion, seat, etc.) [name], [name] ([id number]) INTERVENTIONS - Miscellaneous []  - External ear exam 0 []  - 0 Patient Transfer (multiple staff / Civil Service fast streamer / Similar  devices) []  - 0 Simple Staple / Suture removal (25 or less) []  - 0 Complex Staple / Suture removal (26 or more) []  - 0 Hypo/Hyperglycemic Management (do not check if billed separately) X- 1 15 Ankle / Brachial Index (ABI) - do not check if billed separately Has the patient been seen at the hospital within the last three years: Yes Total Score: 100 Level Of Care: New/Established - Level 3 Electronic Signature(s) Signed: 08/08/2018 4:52:40 PM By: Montey Hora Entered By: Montey Hora on 08/08/2018 10:16:01 [name] ([id number]) -------------------------------------------------------------------------------- Encounter Discharge Information Details Patient Name: [name] Date of Service: 08/08/2018 8:45 AM Medical Record Number: [id number] Patient Account Number: [id number] Date of Birth/Sex: Nov 28, 1945 (72 y.o. M) Treating RN: Montey Hora Primary Care Treyshon Buchanon: [name] Other Clinician: Referring Nnaemeka Samson: Referral, Self Treating Dellanira Dillow/Extender: Melburn Hake, HOYT Weeks in Treatment: 0 Encounter Discharge Information Items Post Procedure Vitals Discharge Condition: Stable Temperature (F): 98.3 Ambulatory Status: Ambulatory Pulse (bpm): 52 Discharge Destination: Home Respiratory Rate (breaths/min): 16 Transportation: Private Auto Blood Pressure (mmHg): 132/71 Accompanied By: self Schedule Follow-up Appointment: Yes Clinical Summary of Care: Electronic Signature(s) Signed: 08/08/2018 4:52:40 PM By: Montey Hora Entered By: Montey Hora on 08/08/2018 10:17:27 [name] ([id number]) -------------------------------------------------------------------------------- Lower Extremity Assessment Details Patient Name: [name] Date of Service: 08/08/2018 8:45 AM Medical Record Number: [id number] Patient Account Number: [id number] Date of Birth/Sex: 1946/04/28 (72 y.o. M) Treating RN: Montey Hora Primary Care Panayiotis Rainville: [name] Other Clinician: Referring Latashia Koch: Referral, Self Treating Nazarene Bunning/Extender: STONE III, HOYT Weeks in Treatment: 0 Edema Assessment Assessed: [Left: No] [Right: No] Edema: [Left: N] [Right: o] Calf Left: Right: Point of Measurement: 34 cm From Medial Instep 36.4 cm cm Ankle Left: Right: Point of Measurement: 12 cm From Medial Instep 20 cm cm Vascular Assessment Claudication: Claudication Assessment [Left:None] Pulses: Dorsalis Pedis Palpable: [Left:Yes] Posterior Tibial Palpable: [Left:Yes] Extremity colors, hair growth, and conditions: Extremity Color: [Left:Hyperpigmented] Hair Growth on Extremity: [Left:Yes] Temperature of Extremity: [Left:Warm] Capillary Refill: [Left:< 3 seconds] Dependent Rubor: [Left:No] Blanched when Elevated: [Left:No] Lipodermatosclerosis: [Left:No] Blood Pressure: Brachial: [Left:132] Dorsalis Pedis: 145 [Left:Dorsalis Pedis:] Ankle: Posterior Tibial: [Left:Posterior Tibial: 1.10] Toe Nail Assessment Left: Right: Thick: No Discolored: No Deformed: No Improper Length and Hygiene: No Electronic Signature(sEVERSON, [name] ([id number]) Signed: 08/08/2018 4:52:40 PM By: Montey Hora Entered By: Montey Hora on 08/08/2018 09:44:18 [name] ([id number]) -------------------------------------------------------------------------------- Multi Wound Chart Details Patient Name: [name] Date of Service: 08/08/2018 8:45 AM Medical Record Number: [id number] Patient Account Number: [id number] Date of  Birth/Sex: 10/14/1945 (72 y.o. 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] Comorbid History: [3:Cataracts, Glaucoma, Anemia, N/A Arrhythmia, Type II Diabetes, Osteoarthritis, Neuropathy] Date Acquired: [3:08/02/2018] [N/A:N/A] Weeks of Treatment: [3:0] [N/A:N/A] Wound Status: [3:Open] [N/A:N/A] Measurements L x W x D [3:5.8x0.9x0.1] [N/A:N/A] (cm) Area (cm) : [3:4.1] [N/A:N/A] Volume (cm) : [3:0.41] [N/A:N/A] Classification: [3:Grade 2] [N/A:N/A] Exudate Amount: [3:Small] [N/A:N/A] Exudate Type: [3:Serosanguineous] [N/A:N/A] Exudate Color: [3:red, brown] [N/A:N/A] Wound Margin: [3:Flat and Intact] [N/A:N/A] Granulation Amount: [3:Small (1-33%)] [N/A:N/A] Granulation Quality: [3:Pale] [N/A:N/A] Necrotic Amount: [3:Medium (34-66%)] [N/A:N/A] Exposed Structures: [3:Fat Layer (Subcutaneous Tissue) Exposed: Yes Fascia: No Tendon: No Muscle: No  Joint: No Bone: No] [N/A:N/A] Epithelialization: [3:Small (1-33%)] [N/A:N/A] Periwound Skin Texture: [3:Excoriation: No Induration: No Callus: No Crepitus: No Rash: No Scarring: No] [N/A:N/A] Periwound Skin Moisture: Maceration: No N/A N/A Dry/Scaly: No Periwound Skin Color: Hemosiderin Staining: Yes N/A N/A Atrophie Blanche: No Cyanosis: No Ecchymosis: No Erythema: No Mottled: No Pallor: No Rubor: No Tenderness on Palpation: No N/A N/A Wound Preparation: Ulcer Cleansing: Wound N/A N/A Cleanser Topical Anesthetic  Applied: Xylocaine 4% Topical Solution Treatment Notes Electronic Signature(s) Signed: 08/08/2018 4:52:40 PM By: Montey Hora Entered By: Montey Hora on 08/08/2018 10:03:26 [name] ([id number]) -------------------------------------------------------------------------------- Multi-Disciplinary Care Plan Details Patient Name: [name] Date of Service: 08/08/2018 8:45 AM Medical Record Number: [id number] Patient Account Number: [id number] Date of Birth/Sex: 1945-11-03 (72 y.o. M) Treating RN: Montey Hora Primary Care Avery Klingbeil: [name] Other Clinician: Referring Christyl Osentoski: Referral, Self Treating Phelan Goers/Extender: Melburn Hake, HOYT Weeks in Treatment: 0 Active Inactive ` Abuse / Safety / Falls / Self Care Management Nursing Diagnoses: Potential for falls Goals: Patient will not experience any injury related to falls Date Initiated: 08/08/2018 Target Resolution Date: 10/28/2018 Goal Status: Active Interventions: Assess fall risk on admission and as needed Notes: ` Orientation to the Wound Care Program Nursing Diagnoses: Knowledge deficit related to the wound healing center program Goals: Patient/caregiver will verbalize understanding of the [facility] Date Initiated: 08/08/2018 Target Resolution Date: 10/28/2018 Goal Status: Active Interventions: Provide education on orientation to the wound center Notes: ` Wound/Skin Impairment Nursing Diagnoses: Impaired tissue integrity Goals: Ulcer/skin breakdown will heal within 14 weeks Date Initiated: 08/08/2018 Target Resolution Date: 10/28/2018 Goal Status: Active Interventions: [name], [name] ([id number]) Assess patient/caregiver ability to obtain necessary supplies Assess patient/caregiver ability to perform ulcer/skin care regimen upon admission and as needed Assess ulceration(s) every visit Notes: Electronic Signature(s) Signed: 08/08/2018 4:52:40 PM By: Montey Hora Entered By: Montey Hora on 08/08/2018 10:03:12 [name] ([id number]) -------------------------------------------------------------------------------- Pain Assessment Details Patient Name: [name] Date of Service: 08/08/2018 8:45 AM Medical Record Number: [id number] Patient Account Number: [id number] Date of Birth/Sex: 07/02/1946 (72 y.o. M) Treating RN: Montey Hora Primary Care Hiyab Nhem: [name] Other Clinician: Referring Karely Hurtado: Referral, Self Treating Lavoris Canizales/Extender: STONE III, HOYT Weeks in Treatment: 0 Active Problems Location of Pain Severity and Description of Pain Patient Has Paino Yes Site Locations Rate the pain. Current Pain Level: 7 Pain Management and Medication Current Pain Management: Electronic Signature(s) Signed: 08/08/2018 4:52:40 PM By: Montey Hora Signed: 08/09/2018 11:20:12 AM By: Lorine Bears RCP, RRT, CHT Entered By: Lorine Bears on 08/08/2018 09:00:34 [name], [name] ([id number]) -------------------------------------------------------------------------------- Patient/Caregiver Education Details Patient Name: [name] Date of Service: 08/08/2018 8:45 AM Medical Record Number: [id number] Patient Account Number: [id number] Date of Birth/Gender: Mar 13, 1946 (72 y.o. M) Treating RN: Montey Hora Primary Care Physician: [name] Other Clinician: Referring Physician: Referral, Self Treating Physician/Extender: [name] in Treatment: 0 Education Assessment Education Provided To: Patient Education Topics Provided Venous: Handouts: Other: wrap precautions and leg elevation Methods: Explain/Verbal Responses: State content correctly Electronic Signature(s) Signed: 08/08/2018 4:52:40 PM By: Montey Hora Entered By: Montey Hora on 08/08/2018 10:16:29 [name]  ([id number]) -------------------------------------------------------------------------------- Wound Assessment Details Patient Name: [name] Date of Service: 08/08/2018 8:45 AM Medical Record Number: [id number] Patient Account Number: [id number] Date of Birth/Sex: 1946/07/23 (72 y.o. M) Treating RN: Montey Hora Primary Care Sigmond Patalano: [name] Other Clinician: Referring Lilie Vezina: Referral, Self Treating Baani Bober/Extender: STONE III, HOYT Weeks in Treatment: 0 Wound Status Wound Number: 3 Primary Diabetic Wound/Ulcer of the Lower Extremity  Etiology: Wound Location: Left Lower Leg - Anterior Wound Open Wounding Event: Trauma Status: Date Acquired: 08/02/2018 Comorbid Cataracts, Glaucoma, Anemia, Arrhythmia, Weeks Of Treatment: 0 History: Type II Diabetes, Osteoarthritis, Neuropathy Clustered Wound: No Wound Measurements Length: (cm) 5.8 % Re Width: (cm) 0.9 % Re Depth: (cm) 0.1 Epit Area: (cm) 4.1 Tun Volume: (cm) 0.41 Und duction in Area: duction in Volume: helialization: Small (1-33%) neling: No ermining: No Wound Description Classification: Grade 2 Wound Margin: Flat and Intact Exudate Amount: Small Exudate Type: Serosanguineous Exudate Color: red, brown Foul Odor After Cleansing: No Slough/Fibrino Yes Wound Bed Granulation Amount: Small (1-33%) Exposed Structure Granulation Quality: Pale Fascia Exposed: No Necrotic Amount: Medium (34-66%) Fat Layer (Subcutaneous Tissue) Exposed: Yes Necrotic Quality: Adherent Slough Tendon Exposed: No Muscle Exposed: No Joint Exposed: No Bone Exposed: No Periwound Skin Texture Texture Color No Abnormalities Noted: No No Abnormalities Noted: No Callus: No Atrophie Blanche: No Crepitus: No Cyanosis: No Excoriation: No Ecchymosis: No Induration: No Erythema: No Rash: No Hemosiderin Staining: Yes Scarring: No Mottled: No Pallor: No Moisture Rubor: No No Abnormalities Noted: No Dry / Scaly:  No Maceration: No [name], Michael ([id number]) Wound Preparation Ulcer Cleansing: Wound Cleanser Topical Anesthetic Applied: Xylocaine 4% Topical Solution Treatment Notes Wound #3 (Left, Anterior Lower Leg) Notes silvercel, abd, kerlix/coban wrap with unna to anchor Electronic Signature(s) Signed: 08/08/2018 4:52:40 PM By: Montey Hora Entered By: Montey Hora on 08/08/2018 09:53:31 [name] ([id number]) -------------------------------------------------------------------------------- Vitals Details Patient Name: [name] Date of Service: 08/08/2018 8:45 AM Medical Record Number: [id number] Patient Account Number: [id number] Date of Birth/Sex: 1946-04-01 (72 y.o. M) Treating RN: Montey Hora Primary Care Evellyn Tuff: [name] Other Clinician: Referring Alita Waldren: Referral, Self Treating Cortland Crehan/Extender: STONE III, HOYT Weeks in Treatment: 0 Vital Signs Time Taken: 09:00 Temperature (F): 98.3 Height (in): 68 Pulse (bpm): 52 Source: Stated Respiratory Rate (breaths/min): 16 Weight (lbs): 186.1 Blood Pressure (mmHg): 132/71 Source: Measured Reference Range: 80 - 120 mg / dl Body Mass Index (BMI): 28.3 Electronic Signature(s) Signed: 08/09/2018 11:20:12 AM By: Lorine Bears RCP, RRT, CHT Entered By: Lorine Bears on 08/08/2018 09:04:30

## 2018-08-15 ENCOUNTER — Encounter: Payer: Medicare Other | Admitting: Physician Assistant

## 2018-08-15 DIAGNOSIS — E1140 Type 2 diabetes mellitus with diabetic neuropathy, unspecified: Secondary | ICD-10-CM | POA: Diagnosis not present

## 2018-08-15 DIAGNOSIS — L97822 Non-pressure chronic ulcer of other part of left lower leg with fat layer exposed: Secondary | ICD-10-CM | POA: Diagnosis not present

## 2018-08-15 DIAGNOSIS — E11622 Type 2 diabetes mellitus with other skin ulcer: Secondary | ICD-10-CM | POA: Diagnosis not present

## 2018-08-15 DIAGNOSIS — I87312 Chronic venous hypertension (idiopathic) with ulcer of left lower extremity: Secondary | ICD-10-CM | POA: Diagnosis not present

## 2018-08-17 NOTE — Progress Notes (Signed)
ALEC, JAROS ([id number]) Visit Report for 08/15/2018 Chief Complaint Document Details Patient Name: [name], [name] Date of Service: 08/15/2018 11:00 AM Medical Record Number: [id number] Patient Account Number: [id number] Date of Birth/Sex: [birth year] (72 y.o. M) Treating RN: Montey Hora Primary Care Provider: [name] Other Clinician: Referring Provider: [name] Treating Provider/Extender: Melburn Hake, Harjas Biggins Weeks in Treatment: 1 Information Obtained from: Patient Chief Complaint Left LE ulcer Electronic Signature(s) Signed: 08/16/2018 12:54:11 AM By: Worthy Keeler PA-C Entered By: Worthy Keeler on 08/15/2018 10:57:10 [name] ([id number]) -------------------------------------------------------------------------------- Debridement Details Patient Name: [name] Date of Service: 08/15/2018 11:00 AM Medical Record Number: [id number] Patient Account Number: [id number] Date of Birth/Sex: 28-Jul-1946 (72 y.o. M) Treating RN: Montey Hora Primary Care Provider: [name] Other Clinician: Referring Provider: [name] Treating Provider/Extender: Melburn Hake, Sher Shampine Weeks in Treatment: 1 Debridement Performed for Wound #3 Left,Anterior Lower Leg Assessment: Performed By: Physician STONE III, Sean Macwilliams E., PA-C Debridement Type: Debridement Severity of Tissue Pre Fat layer exposed Debridement: Level of Consciousness (Pre- Awake and Alert procedure): Pre-procedure Verification/Time Yes - 11:44 Out Taken: Start Time: 11:44 Pain Control: Lidocaine 4% Topical Solution Total Area Debrided (L x W): 4.5 (cm) x 2 (cm) = 9 (cm) Tissue and other material Viable, Non-Viable, Slough, Subcutaneous, Fibrin/Exudate, Slough debrided: Level: Skin/Subcutaneous Tissue Debridement Description: Excisional Instrument: Curette Bleeding: Minimum Hemostasis Achieved: Pressure End Time: 11:47 Procedural Pain: 0 Post Procedural Pain:  0 Response to Treatment: Procedure was tolerated well Level of Consciousness Awake and Alert (Post-procedure): Post Debridement Measurements of Total Wound Length: (cm) 4.5 Width: (cm) 2 Depth: (cm) 0.2 Volume: (cm) 1.414 Character of Wound/Ulcer Post Debridement: Improved Severity of Tissue Post Debridement: Fat layer exposed Post Procedure Diagnosis Same as Pre-procedure Electronic Signature(s) Signed: 08/15/2018 5:02:25 PM By: Montey Hora Signed: 08/16/2018 12:54:11 AM By: Worthy Keeler PA-C Entered By: Montey Hora on 08/15/2018 11:47:05 [name] ([id number]) -------------------------------------------------------------------------------- HPI Details Patient Name: [name] Date of Service: 08/15/2018 11:00 AM Medical Record Number: [id number] Patient Account Number: [id number] Date of Birth/Sex: [birth year] (72 y.o. M) Treating RN: Montey Hora Primary Care Provider: [name] Other Clinician: Referring Provider: [name] Treating Provider/Extender: Melburn Hake, Emelyn Roen Weeks in Treatment: 1 History of Present Illness HPI Description: 72 year old gentleman who was recently seen by his nephrologist [name], and noted to have a wound on his left lower extremity which was lacerated 2 months ago and now has reopened. The patient's left shin has a ulceration with some exudate but no evidence of infection and he was referred to [place] for further care as it was known that the patient has had some peripheral vascular disease in the past. Past medical history significant for chronic kidney disease, atrial fibrillation, diabetes mellitus,status post kidney transplant in 1983 and 2005, a week fistula graft placement, status post previous bowel surgery. 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 10/06/2015 -- x-ray of the left tibia and fibula shows no evidence of osteomyelitis. The patient has also had  Doppler studies of his extremity and is awaiting the appointment with the vascular surgeon. We have not yet received these reports. 10/13/2015 -- lower extremity venous duplex reflux evaluation shows reflux in the left common femoral vein, left saphenofemoral junction and the proximal greater saphenous vein extending to the proximal calf. There is also reflux in the left proximal to mid small saphenous vein. Arterial duplex studies done showed the resting ABI was not applicable due to tibial artery  medial calcification. 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 The right TBI was 1 which is normal and the left ABI was 1 which is normal. Patient has otherwise been doing fine and has been compliant with his dressings. 10/20/2015 -- He was seen by [name] recently for a vascular opinion on 10/15/2015. His left lower extremity venous insufficiency duplex study revealed GSV reflux,SS vein reflux and deep venous reflux in the common femoral vein. His ABIs were non compressible and his TBI on the right was 1.01 and on the left was 0.80. He was asked to continue with the wound care with compressive therapy followed by EVLA of the left GS vein 3 months. He recommended 20-30 mm thigh-high compression stockings and the need for a three-month trial of this. The patient had an Unna boot applied at the vascular office but he could not tolerate this with a lot of pain and issues with his toes and hence came here on Friday for removal of this and we reapplied a 2 layer compression. 11/10/2015 -- patient still has not purchased his 20-30 mm thigh-high compression stockings as prescribed by [name]. Readmission: 08/08/18 on evaluation today patient presents for readmission concerning a new injury to the left anterior lower extremity. He was previously seen in 2017 here in our clinic.  He states that he has done fairly well since that point. Nonetheless he is having at this time some pain but states that he hit this on a table that fell over and actually struck his leg. This appears to have pulled back some of his skin which folded in on itself and is causing some difficulty as far as that is concerned. There does not appear to be any evidence of infection at this time. No fevers, chills, nausea, or vomiting noted at this time. He's been using dressings on his own currently without complication. 08/15/18 on evaluation today patient actually appears to be doing somewhat better in regard to his wound of the lower [name] when compared to the first visit last week. I had to do a much more extensive debridement at that time it does appear that I'm gonna have to perform some debridement today but it does not look to be as extensive by any means. Nonetheless fortunately he does not show any signs of infection he does have discomfort at this site. I believe based on what I'm seeing currently he may benefit from Iodoflex to help keep the wound bed clean. Electronic Signature(s) Signed: 08/16/2018 12:54:11 AM By: Leane Call, Nghia ([id number]) Entered By: Worthy Keeler on 08/16/2018 00:49:57 [name], [name] ([id number]) -------------------------------------------------------------------------------- Physical Exam Details Patient Name: [name] Date of Service: 08/15/2018 11:00 AM Medical Record Number: [id number] Patient Account Number: [id number] Date of Birth/Sex: December 08, 1945 (72 y.o. M) Treating RN: Montey Hora Primary Care Provider: [name] Other Clinician: Referring Provider: [name] Treating Provider/Extender: STONE III, Starlina Lapre Weeks in Treatment: 1 Constitutional Well-nourished and well-hydrated in no acute distress. Respiratory normal breathing without difficulty. Psychiatric this patient is able to make decisions and  demonstrates good insight into disease process. Alert and Oriented x 3. pleasant and cooperative. Notes Patient's wound did require sharp debridement today. Fortunately tolerated this with some pain but nothing too significant and was able to get through it without complication. Post debridement the wound bed appears to be doing much better. I was definitely able  to get it much cleaner. Electronic Signature(s) Signed: 08/16/2018 12:54:11 AM By: Worthy Keeler PA-C Entered By: Worthy Keeler on 08/16/2018 00:50:30 [name] ([id number]) -------------------------------------------------------------------------------- Physician Orders Details Patient Name: [name] Date of Service: 08/15/2018 11:00 AM Medical Record Number: [id number] Patient Account Number: [id number] Date of Birth/Sex: March 08, 1946 (72 y.o. M) Treating RN: Montey Hora Primary Care Provider: [name] Other Clinician: Referring Provider: [name] Treating Provider/Extender: Melburn Hake, Margene Cherian Weeks in Treatment: 1 Verbal / Phone Orders: No Diagnosis Coding ICD-10 Coding Code Description E11.622 Type 2 diabetes mellitus with other skin ulcer L97.822 Non-pressure chronic ulcer of other part of left lower leg with fat layer exposed I73.9 Peripheral vascular disease, unspecified I87.312 Chronic venous hypertension (idiopathic) with ulcer of left lower extremity Z94.0 Kidney transplant status Wound Cleansing Wound #3 Left,Anterior Lower Leg o Cleanse wound with mild soap and water o May shower with protection. - Please do not get your wrap wet and call us if you do get it wet - [phone] Anesthetic (add to Medication List) Wound #3 Left,Anterior Lower Leg o Topical Lidocaine 4% cream applied to wound bed prior to debridement (In Clinic Only). Primary Wound Dressing Wound #3 Left,Anterior Lower Leg o Iodoflex Secondary Dressing Wound #3 Left,Anterior Lower Leg o ABD  pad Dressing Change Frequency Wound #3 Left,Anterior Lower Leg o Change dressing every week Follow-up Appointments Wound #3 Left,Anterior Lower Leg o Return Appointment in 1 week. Edema Control Wound #3 Left,Anterior Lower Leg o Kerlix and Coban - Left Lower Extremity - may anchor with unna paste Additional Orders / Instructions Wound #3 Left,Anterior Lower Leg o OK to return to work [place], Tammi Klippel ([id number]) o Activity as tolerated Electronic Signature(s) Signed: 08/15/2018 5:02:25 PM By: Montey Hora Signed: 08/16/2018 12:54:11 AM By: Worthy Keeler PA-C Entered By: Montey Hora on 08/15/2018 11:48:20 [name] ([id number]) -------------------------------------------------------------------------------- Problem List Details Patient Name: [name] Date of Service: 08/15/2018 11:00 AM Medical Record Number: [id number] Patient Account Number: [id number] Date of Birth/Sex: 07/13/1946 (72 y.o. M) Treating RN: Montey Hora Primary Care Provider: [name] Other Clinician: Referring Provider: [name] Treating Provider/Extender: Melburn Hake, Ajeenah Heiny Weeks in Treatment: 1 Active Problems ICD-10 Evaluated Encounter Code Description Active Date Today Diagnosis E11.622 Type 2 diabetes mellitus with other skin ulcer 08/08/2018 No Yes L97.822 Non-pressure chronic ulcer of other part of left lower leg with 08/08/2018 No Yes fat layer exposed I73.9 Peripheral vascular disease, unspecified 08/08/2018 No Yes I87.312 Chronic venous hypertension (idiopathic) with ulcer of left 08/08/2018 No Yes lower extremity Z94.0 Kidney transplant status 08/08/2018 No Yes Inactive Problems Resolved Problems Electronic Signature(s) Signed: 08/16/2018 12:54:11 AM By: Worthy Keeler PA-C Entered By: Worthy Keeler on 08/15/2018 10:57:05 [name]  ([id number]) -------------------------------------------------------------------------------- Progress Note Details Patient Name: [name] Date of Service: 08/15/2018 11:00 AM Medical Record Number: [id number] Patient Account Number: [id number] Date of Birth/Sex: 1946-02-25 (72 y.o. M) Treating RN: Montey Hora Primary Care Provider: [name] Other Clinician: Referring Provider: [name] Treating Provider/Extender: Melburn Hake, Ronae Noell Weeks in Treatment: 1 Subjective Chief Complaint Information obtained from Patient Left LE ulcer History of Present Illness (HPI) 72 year old gentleman who was recently seen by his nephrologist [name], and noted to have a wound on his left lower extremity which was lacerated 2 months ago and now has reopened. The patient's left shin has a ulceration with some exudate but no evidence of infection and he was referred to [place] for further care as it was known that the  patient has had some peripheral vascular disease in the past. Past medical history significant for chronic kidney disease, atrial fibrillation, diabetes mellitus,status post kidney transplant in 1983 and 2005, a week fistula graft placement, status post previous bowel surgery. 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 10/06/2015 -- x-ray of the left tibia and fibula shows no evidence of osteomyelitis. The patient has also had Doppler studies of his extremity and is awaiting the appointment with the vascular surgeon. We have not yet received these reports. 10/13/2015 -- lower extremity venous duplex reflux evaluation shows reflux in the left common femoral vein, left saphenofemoral junction and the proximal greater saphenous vein extending to the proximal calf. There is also reflux in the left proximal to mid small saphenous vein. Arterial duplex studies done showed the resting ABI was not applicable due to tibial artery  medial calcification. 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 The right TBI was 1 which is normal and the left ABI was 1 which is normal. Patient has otherwise been doing fine and has been compliant with his dressings. 10/20/2015 -- He was seen by [name] recently for a vascular opinion on 10/15/2015. His left lower extremity venous insufficiency duplex study revealed GSV reflux,SS vein reflux and deep venous reflux in the common femoral vein. His ABIs were non compressible and his TBI on the right was 1.01 and on the left was 0.80. He was asked to continue with the wound care with compressive therapy followed by EVLA of the left GS vein 3 months. He recommended 20-30 mm thigh-high compression stockings and the need for a three-month trial of this. The patient had an Unna boot applied at the vascular office but he could not tolerate this with a lot of pain and issues with his toes and hence came here on Friday for removal of this and we reapplied a 2 layer compression. 11/10/2015 -- patient still has not purchased his 20-30 mm thigh-high compression stockings as prescribed by [name]. Readmission: 08/08/18 on evaluation today patient presents for readmission concerning a new injury to the left anterior lower extremity. He was previously seen in 2017 here in our clinic. He states that he has done fairly well since that point. Nonetheless he is having at this time some pain but states that he hit this on a table that fell over and actually struck his leg. This appears to have pulled back some of his skin which folded in on itself and is causing some difficulty as far as that is concerned. There does not appear to be any evidence of infection at this time. No fevers, chills, nausea, or vomiting noted at this time. He's been using dressings on his own currently without  complication. 08/15/18 on evaluation today patient actually appears to be doing somewhat better in regard to his wound of the lower [name] when compared to the first visit last week. I had to do a much more extensive debridement at that time it does appear that I'm gonna have to perform some debridement today but it does not look to be as extensive by any means. Nonetheless [name], [name] ([id number]) fortunately he does not show any signs of infection he does have discomfort at this site. I believe based on what I'm seeing currently he may benefit from Iodoflex to help keep the wound bed  clean. Patient History Information obtained from Patient. Family History Hypertension - Maternal Grandparents, Kidney Disease - Maternal Grandparents, Stroke - Maternal Grandparents, No family history of Cancer, Diabetes, Heart Disease, Hereditary Spherocytosis, Lung Disease, Seizures, Thyroid Problems, Tuberculosis. Social History Former smoker - teen years, Marital Status - Married, Alcohol Use - Never - hx drinking on weekends, Drug Use - No History, Caffeine Use - Daily. Medical History Hospitalization/Surgery History - 07/09/2004, [facility] in [place], Kidney Transplant. Medical And Surgical History Notes Genitourinary pt had kidney transplant in 2005; CKD Review of Systems (ROS) Constitutional Symptoms (General Health) Denies complaints or symptoms of Fever, Chills. Respiratory The patient has no complaints or symptoms. Cardiovascular Complains or has symptoms of LE edema. Psychiatric The patient has no complaints or symptoms. Objective Constitutional Well-nourished and well-hydrated in no acute distress. Vitals Time Taken: 11:19 AM, Height: 68 in, Weight: 186.1 lbs, BMI: 28.3, Temperature: 98.3 F, Pulse: 52 bpm, Respiratory Rate: 18 breaths/min, Blood Pressure: 140/66 mmHg, Capillary Blood Glucose: 103 mg/dl. Respiratory normal breathing without difficulty. Psychiatric this  patient is able to make decisions and demonstrates good insight into disease process. Alert and Oriented x 3. pleasant and cooperative. LYDIA, MENG ([id number]) General Notes: Patient's wound did require sharp debridement today. Fortunately tolerated this with some pain but nothing too significant and was able to get through it without complication. Post debridement the wound bed appears to be doing much better. I was definitely able to get it much cleaner. Integumentary (Hair, Skin) Wound #3 status is Open. Original cause of wound was Trauma. The wound is located on the Left,Anterior Lower Leg. The wound measures 4.5cm length x 2cm width x 0.1cm depth; 7.069cm^2 area and 0.707cm^3 volume. There is Fat Layer (Subcutaneous Tissue) Exposed exposed. There is no tunneling or undermining noted. There is a medium amount of serosanguineous drainage noted. The wound margin is flat and intact. There is small (1-33%) pale granulation within the wound bed. There is a medium (34-66%) amount of necrotic tissue within the wound bed including Adherent Slough. The periwound skin appearance exhibited: Hemosiderin Staining. The periwound skin appearance did not exhibit: Callus, Crepitus, Excoriation, Induration, Rash, Scarring, Dry/Scaly, Maceration, Atrophie Blanche, Cyanosis, Ecchymosis, Mottled, Pallor, Rubor, Erythema. Assessment Active Problems ICD-10 Type 2 diabetes mellitus with other skin ulcer Non-pressure chronic ulcer of other part of left lower leg with fat layer exposed Peripheral vascular disease, unspecified Chronic venous hypertension (idiopathic) with ulcer of left lower extremity Kidney transplant status Procedures Wound #3 Pre-procedure diagnosis of Wound #3 is a Diabetic Wound/Ulcer of the Lower Extremity located on the Left,Anterior Lower Leg .Severity of Tissue Pre Debridement is: Fat layer exposed. There was a Excisional Skin/Subcutaneous Tissue Debridement with a total area of 9  sq cm performed by STONE III, Rhenda [place] E., PA-C. With the following instrument(s): Curette to remove Viable and Non-Viable tissue/material. Material removed includes Subcutaneous Tissue, Slough, and Fibrin/Exudate after achieving pain control using Lidocaine 4% Topical Solution. No specimens were taken. A time out was conducted at 11:44, prior to the start of the procedure. A Minimum amount of bleeding was controlled with Pressure. The procedure was tolerated well with a pain level of 0 throughout and a pain level of 0 following the procedure. Post Debridement Measurements: 4.5cm length x 2cm width x 0.2cm depth; 1.414cm^3 volume. Character of Wound/Ulcer Post Debridement is improved. Severity of Tissue Post Debridement is: Fat layer exposed. Post procedure Diagnosis Wound #3: Same as Pre-Procedure Plan Wound Cleansing: Wound #3 Left,Anterior Lower Leg: Cleanse wound with mild  soap and water May shower with protection. - Please do not get your wrap wet and call us if you do get it wet - [place] ([id number]) Anesthetic (add to Medication List): Wound #3 Left,Anterior Lower Leg: Topical Lidocaine 4% cream applied to wound bed prior to debridement (In Clinic Only). Primary Wound Dressing: Wound #3 Left,Anterior Lower Leg: Iodoflex Secondary Dressing: Wound #3 Left,Anterior Lower Leg: ABD pad Dressing Change Frequency: Wound #3 Left,Anterior Lower Leg: Change dressing every week Follow-up Appointments: Wound #3 Left,Anterior Lower Leg: Return Appointment in 1 week. Edema Control: Wound #3 Left,Anterior Lower Leg: Kerlix and Coban - Left Lower Extremity - may anchor with unna paste Additional Orders / Instructions: Wound #3 Left,Anterior Lower Leg: OK to return to work Activity as tolerated We will initiate treatment with Iodoflex at this point which I think is a good option for him. We will continue with the compression wraps and I will see him back for reevaluation  to see were things stand. Please see above for specific wound care orders. We will see patient for re-evaluation in 1 week(s) here in the clinic. If anything worsens or changes patient will contact our office for additional recommendations. Electronic Signature(s) Signed: 08/16/2018 12:54:11 AM By: Worthy Keeler PA-C Entered By: Worthy Keeler on 08/16/2018 00:50:42 [name], [name] ([id number]) -------------------------------------------------------------------------------- ROS/PFSH Details Patient Name: [name] Date of Service: 08/15/2018 11:00 AM Medical Record Number: [id number] Patient Account Number: [id number] Date of Birth/Sex: [birth year] (72 y.o. M) Treating RN: Montey Hora Primary Care Provider: [name] Other Clinician: Referring Provider: [name] Treating Provider/Extender: Melburn Hake, Daziah Hesler Weeks in Treatment: 1 Information Obtained From Patient Wound History Do you currently have one or more open woundso Yes How many open wounds do you currently haveo 1 Approximately how long have you had your woundso 1 week How have you been treating your wound(s) until nowo wwrapping compression, cleaning Has your wound(s) ever healed and then re-openedo No Have you had any lab work done in the past montho No Have you tested positive for an antibiotic resistant organism (MRSA, VRE)o No Have you tested positive for osteomyelitis (bone infection)o No Have you had other problems associated with your woundso Swelling Constitutional Symptoms (General Health) Complaints and Symptoms: Negative for: Fever; Chills Cardiovascular Complaints and Symptoms: Positive for: LE edema Medical History: Positive for: Arrhythmia - a-fib; Congestive Heart Failure; Hypertension Negative for: Angina; Coronary Artery Disease; Deep Vein Thrombosis; Hypotension; Myocardial Infarction; Peripheral Arterial Disease; Peripheral Venous Disease; Phlebitis;  Vasculitis Eyes Medical History: Positive for: Cataracts; Glaucoma - had sugery Negative for: Optic Neuritis Hematologic/Lymphatic Medical History: Positive for: Anemia Negative for: Hemophilia; Human Immunodeficiency Virus; Lymphedema; Sickle Cell Disease Respiratory Complaints and Symptoms: No Complaints or Symptoms Medical History: Negative for: Aspiration; Asthma; Chronic Obstructive Pulmonary Disease (COPD); Pneumothorax; Sleep Apnea; Tuberculosis [name], [name] ([id number]) Endocrine Medical History: Positive for: Type II Diabetes Negative for: Type I Diabetes Time with diabetes: 2005 Treated with: Insulin, Oral agents Blood sugar tested every day: Yes Tested : 3 times a day Genitourinary Medical History: Negative for: End Stage Renal Disease Past Medical History Notes: pt had kidney transplant in 2005; CKD Immunological Medical History: Negative for: Lupus Erythematosus; Raynaudos; Scleroderma Integumentary (Skin) Medical History: Negative for: History of Burn; History of pressure wounds Musculoskeletal Medical History: Positive for: Osteoarthritis Negative for: Gout; Rheumatoid Arthritis; Osteomyelitis Neurologic Medical History: Positive for: Neuropathy Negative for: Dementia; Quadriplegia; Paraplegia; Seizure Disorder Oncologic Medical History: Negative for: Received Chemotherapy; Received Radiation Psychiatric Complaints and  Symptoms: No Complaints or Symptoms Medical History: Negative for: Anorexia/bulimia; Confinement Anxiety HBO Extended History Items Eyes: Eyes: Cataracts Glaucoma Immunizations Pneumococcal Vaccine: Received Pneumococcal VaccinationJHASE, [name] ([id number]) Tetanus Vaccine: Last tetanus shot: 08/02/2018 Implantable Devices Hospitalization / Surgery History Name of Hospital Purpose of Hospitalization/Surgery Date [facility] in [place] Kidney Transplant 07/09/2004 Family and Social History Cancer: No;  Diabetes: No; Heart Disease: No; Hereditary Spherocytosis: No; Hypertension: Yes - Maternal Grandparents; Kidney Disease: Yes - Maternal Grandparents; Lung Disease: No; Seizures: No; Stroke: Yes - Maternal Grandparents; Thyroid Problems: No; Tuberculosis: No; Former smoker - teen years; Marital Status - Married; Alcohol Use: Never - hx drinking on weekends; Drug Use: No History; Caffeine Use: Daily; Financial Concerns: No; Food, Clothing or Shelter Needs: No; Support System Lacking: No; Transportation Concerns: No; Advanced Directives: No; Patient does not want information on Advanced Directives; Do not resuscitate: No; Living Will: No; Medical Power of Attorney: No Physician Affirmation I have reviewed and agree with the above information. Electronic Signature(s) Signed: 08/16/2018 12:54:11 AM By: Worthy Keeler PA-C Signed: 08/16/2018 3:52:47 PM By: Montey Hora Entered By: Worthy Keeler on 08/16/2018 00:50:17 [name], [name] ([id number]) -------------------------------------------------------------------------------- SuperBill Details Patient Name: [name] Date of Service: 08/15/2018 Medical Record Number: [id number] Patient Account Number: [id number] Date of Birth/Sex: September 07, 1946 (72 y.o. 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 Z94.0 Kidney transplant status Facility Procedures CPT4 Code Description: 42353614 11042 - DEB SUBQ TISSUE 20 SQ CM/< ICD-10 Diagnosis Description L97.822 Non-pressure  chronic ulcer of other part of left lower leg with Modifier: fat layer expos Quantity: 1 ed Physician Procedures CPT4 Code Description: 4315400 86761 - WC PHYS SUBQ TISS 20 SQ CM ICD-10 Diagnosis Description L97.822 Non-pressure chronic ulcer of other part of left lower leg with Modifier: fat layer expos Quantity: 1 ed Electronic Signature(s) Signed: 08/16/2018 12:54:11 AM By: Worthy Keeler PA-C Entered By: Worthy Keeler on 08/16/2018 00:50:50

## 2018-08-20 DIAGNOSIS — S81802A Unspecified open wound, left lower leg, initial encounter: Secondary | ICD-10-CM

## 2018-08-20 HISTORY — DX: Unspecified open wound, left lower leg, initial encounter: S81.802A

## 2018-08-21 ENCOUNTER — Encounter: Payer: Medicare Other | Attending: Physician Assistant | Admitting: Physician Assistant

## 2018-08-21 DIAGNOSIS — I87312 Chronic venous hypertension (idiopathic) with ulcer of left lower extremity: Secondary | ICD-10-CM | POA: Diagnosis not present

## 2018-08-21 DIAGNOSIS — E1151 Type 2 diabetes mellitus with diabetic peripheral angiopathy without gangrene: Secondary | ICD-10-CM | POA: Insufficient documentation

## 2018-08-21 DIAGNOSIS — Z794 Long term (current) use of insulin: Secondary | ICD-10-CM | POA: Insufficient documentation

## 2018-08-21 DIAGNOSIS — L97822 Non-pressure chronic ulcer of other part of left lower leg with fat layer exposed: Secondary | ICD-10-CM | POA: Insufficient documentation

## 2018-08-21 DIAGNOSIS — Z87891 Personal history of nicotine dependence: Secondary | ICD-10-CM | POA: Diagnosis not present

## 2018-08-21 DIAGNOSIS — E785 Hyperlipidemia, unspecified: Secondary | ICD-10-CM | POA: Diagnosis not present

## 2018-08-21 DIAGNOSIS — E11622 Type 2 diabetes mellitus with other skin ulcer: Secondary | ICD-10-CM | POA: Insufficient documentation

## 2018-08-21 DIAGNOSIS — I10 Essential (primary) hypertension: Secondary | ICD-10-CM | POA: Diagnosis not present

## 2018-08-21 DIAGNOSIS — Z940 Kidney transplant status: Secondary | ICD-10-CM | POA: Diagnosis not present

## 2018-08-21 DIAGNOSIS — I129 Hypertensive chronic kidney disease with stage 1 through stage 4 chronic kidney disease, or unspecified chronic kidney disease: Secondary | ICD-10-CM | POA: Diagnosis not present

## 2018-08-21 DIAGNOSIS — N189 Chronic kidney disease, unspecified: Secondary | ICD-10-CM | POA: Diagnosis not present

## 2018-08-21 DIAGNOSIS — E1129 Type 2 diabetes mellitus with other diabetic kidney complication: Secondary | ICD-10-CM | POA: Diagnosis not present

## 2018-08-22 DIAGNOSIS — Z6829 Body mass index (BMI) 29.0-29.9, adult: Secondary | ICD-10-CM | POA: Diagnosis not present

## 2018-08-22 DIAGNOSIS — I129 Hypertensive chronic kidney disease with stage 1 through stage 4 chronic kidney disease, or unspecified chronic kidney disease: Secondary | ICD-10-CM | POA: Diagnosis not present

## 2018-08-22 DIAGNOSIS — I739 Peripheral vascular disease, unspecified: Secondary | ICD-10-CM | POA: Diagnosis not present

## 2018-08-22 DIAGNOSIS — N189 Chronic kidney disease, unspecified: Secondary | ICD-10-CM | POA: Diagnosis not present

## 2018-08-22 DIAGNOSIS — N179 Acute kidney failure, unspecified: Secondary | ICD-10-CM | POA: Diagnosis not present

## 2018-08-22 DIAGNOSIS — R001 Bradycardia, unspecified: Secondary | ICD-10-CM | POA: Diagnosis not present

## 2018-08-22 DIAGNOSIS — N2581 Secondary hyperparathyroidism of renal origin: Secondary | ICD-10-CM | POA: Diagnosis not present

## 2018-08-22 DIAGNOSIS — R0601 Orthopnea: Secondary | ICD-10-CM | POA: Diagnosis not present

## 2018-08-22 DIAGNOSIS — Z940 Kidney transplant status: Secondary | ICD-10-CM | POA: Diagnosis not present

## 2018-08-22 DIAGNOSIS — D631 Anemia in chronic kidney disease: Secondary | ICD-10-CM | POA: Diagnosis not present

## 2018-08-22 DIAGNOSIS — E785 Hyperlipidemia, unspecified: Secondary | ICD-10-CM | POA: Diagnosis not present

## 2018-08-22 DIAGNOSIS — Z79899 Other long term (current) drug therapy: Secondary | ICD-10-CM | POA: Diagnosis not present

## 2018-08-22 DIAGNOSIS — E118 Type 2 diabetes mellitus with unspecified complications: Secondary | ICD-10-CM | POA: Diagnosis not present

## 2018-08-28 ENCOUNTER — Encounter: Payer: Medicare Other | Admitting: Physician Assistant

## 2018-08-28 DIAGNOSIS — L97822 Non-pressure chronic ulcer of other part of left lower leg with fat layer exposed: Secondary | ICD-10-CM | POA: Diagnosis not present

## 2018-08-28 DIAGNOSIS — Z87891 Personal history of nicotine dependence: Secondary | ICD-10-CM | POA: Diagnosis not present

## 2018-08-28 DIAGNOSIS — E1151 Type 2 diabetes mellitus with diabetic peripheral angiopathy without gangrene: Secondary | ICD-10-CM | POA: Diagnosis not present

## 2018-08-28 DIAGNOSIS — Z940 Kidney transplant status: Secondary | ICD-10-CM | POA: Diagnosis not present

## 2018-08-28 DIAGNOSIS — I87312 Chronic venous hypertension (idiopathic) with ulcer of left lower extremity: Secondary | ICD-10-CM | POA: Diagnosis not present

## 2018-08-28 DIAGNOSIS — E11622 Type 2 diabetes mellitus with other skin ulcer: Secondary | ICD-10-CM | POA: Diagnosis not present

## 2018-08-30 ENCOUNTER — Encounter (HOSPITAL_COMMUNITY)
Admission: RE | Admit: 2018-08-30 | Discharge: 2018-08-30 | Disposition: A | Discharge status: Home / Self Care | Payer: Medicare Other | Source: Ambulatory Visit | Attending: Nephrology | Admitting: Nephrology

## 2018-08-30 ENCOUNTER — Encounter (HOSPITAL_COMMUNITY): Payer: Self-pay

## 2018-08-30 DIAGNOSIS — D631 Anemia in chronic kidney disease: Secondary | ICD-10-CM | POA: Insufficient documentation

## 2018-08-30 DIAGNOSIS — N185 Chronic kidney disease, stage 5: Secondary | ICD-10-CM | POA: Diagnosis not present

## 2018-08-30 LAB — HEMOGLOBIN: HEMOGLOBIN: 8.9 g/dL — AB (ref 13.0–17.0)

## 2018-08-30 MED ORDER — DARBEPOETIN ALFA 60 MCG/0.3ML IJ SOSY
120.0000 ug | PREFILLED_SYRINGE | INTRAMUSCULAR | Status: DC
  Administered 2018-08-30: 120 ug via SUBCUTANEOUS
  Filled 2018-08-30: qty 0.6

## 2018-08-31 NOTE — Progress Notes (Signed)
SAVA, PROBY ([id number]) Visit Report for 08/28/2018 Arrival Information Details Patient Name: [name], [name] Date of Service: 08/28/2018 12:30 PM Medical Record Number: [id number] Patient Account Number: [id number] Date of Birth/Sex: 08-05-1946 (72 y.o. Male) Treating RN: Secundino Ginger Primary Care Jerimiah Wolman: [name] Other Clinician: Referring Alfhild Partch: [name] Treating Tangela Dolliver/Extender: Melburn Hake, HOYT Weeks in Treatment: 2 Visit Information History Since Last Visit Added or deleted any medications: No Patient Arrived: Ambulatory Any new allergies or adverse reactions: No Arrival Time: 12:40 Had a fall or experienced change in No Accompanied By: self activities of daily living that may affect Transfer Assistance: None risk of falls: Patient Identification Verified: Yes Signs or symptoms of abuse/neglect since last visito No Secondary Verification Process Completed: Yes Hospitalized since last visit: No Patient Has Alerts: Yes Implantable device outside of the clinic excluding No Patient Alerts: DMII cellular tissue based products placed in the center aspirin 81 since last visit: Has Dressing in Place as Prescribed: Yes Pain Present Now: No Electronic Signature(s) Signed: 08/28/2018 4:12:55 PM By: Secundino Ginger Entered By: Secundino Ginger on 08/28/2018 12:41:17 [name] ([id number]) -------------------------------------------------------------------------------- Clinic Level of Care Assessment Details Patient Name: [name] Date of Service: 08/28/2018 12:30 PM Medical Record Number: [id number] Patient Account Number: [id number] Date of Birth/Sex: [birth year] (72 y.o. Male) Treating RN: Harold Barban Primary Care Moreen Piggott: [name] Other Clinician: Referring Mayan Dolney: [name] Treating Zya Finkle/Extender: Melburn Hake, HOYT Weeks in Treatment: 2 Clinic Level of Care Assessment Items TOOL 4 Quantity Score []  - Use when only an  EandM is performed on FOLLOW-UP visit 0 ASSESSMENTS - Nursing Assessment / Reassessment X - Reassessment of Co-morbidities (includes updates in patient status) 1 10 X- 1 5 Reassessment of Adherence to Treatment Plan ASSESSMENTS - Wound and Skin Assessment / Reassessment X - Simple Wound Assessment / Reassessment - one wound 1 5 []  - 0 Complex Wound Assessment / Reassessment - multiple wounds []  - 0 Dermatologic / Skin Assessment (not related to wound area) ASSESSMENTS - Focused Assessment []  - Circumferential Edema Measurements - multi extremities 0 []  - 0 Nutritional Assessment / Counseling / Intervention []  - 0 Lower Extremity Assessment (monofilament, tuning fork, pulses) []  - 0 Peripheral Arterial Disease Assessment (using hand held doppler) ASSESSMENTS - Ostomy and/or Continence Assessment and Care []  - Incontinence Assessment and Management 0 []  - 0 Ostomy Care Assessment and Management (repouching, etc.) PROCESS - Coordination of Care X - Simple Patient / Family Education for ongoing care 1 15 []  - 0 Complex (extensive) Patient / Family Education for ongoing care X- 1 10 Staff obtains Programmer, systems, Records, Test Results / Process Orders []  - 0 Staff telephones HHA, Nursing Homes / Clarify orders / etc []  - 0 Routine Transfer to another Facility (non-emergent condition) []  - 0 Routine Hospital Admission (non-emergent condition) []  - 0 New Admissions / Biomedical engineer / Ordering NPWT, Apligraf, etc. []  - 0 Emergency Hospital Admission (emergent condition) X- 1 10 Simple Discharge Coordination DALBERT, STILLINGS ([id number]) []  - 0 Complex (extensive) Discharge Coordination PROCESS - Special Needs []  - Pediatric / Minor Patient Management 0 []  - 0 Isolation Patient Management []  - 0 Hearing / Language / Visual special needs []  - 0 Assessment of Community assistance (transportation, D/C planning, etc.) []  - 0 Additional assistance / Altered mentation []  -  0 Support Surface(s) Assessment (bed, cushion, seat, etc.) INTERVENTIONS - Wound Cleansing / Measurement X - Simple Wound Cleansing - one wound 1 5 []  - 0 Complex Wound Cleansing - multiple wounds []  -  0 Wound Imaging (photographs - any number of wounds) []  - 0 Wound Tracing (instead of photographs) X- 1 5 Simple Wound Measurement - one wound []  - 0 Complex Wound Measurement - multiple wounds INTERVENTIONS - Wound Dressings X - Small Wound Dressing one or multiple wounds 1 10 []  - 0 Medium Wound Dressing one or multiple wounds []  - 0 Large Wound Dressing one or multiple wounds []  - 0 Application of Medications - topical []  - 0 Application of Medications - injection INTERVENTIONS - Miscellaneous []  - External ear exam 0 []  - 0 Specimen Collection (cultures, biopsies, blood, body fluids, etc.) []  - 0 Specimen(s) / Culture(s) sent or taken to Lab for analysis []  - 0 Patient Transfer (multiple staff / Civil Service fast streamer / Similar devices) []  - 0 Simple Staple / Suture removal (25 or less) []  - 0 Complex Staple / Suture removal (26 or more) []  - 0 Hypo / Hyperglycemic Management (close monitor of Blood Glucose) []  - 0 Ankle / Brachial Index (ABI) - do not check if billed separately X- 1 5 Vital Signs Wolbert, Jadakiss ([id number]) Has the patient been seen at the hospital within the last three years: Yes Total Score: 80 Level Of Care: New/Established - Level 3 Electronic Signature(s) Signed: 08/29/2018 5:05:35 PM By: Harold Barban Entered By: Harold Barban on 08/28/2018 13:05:18 [name] ([id number]) -------------------------------------------------------------------------------- Lower Extremity Assessment Details Patient Name: [name] Date of Service: 08/28/2018 12:30 PM Medical Record Number: [id number] Patient Account Number: [id number] Date of Birth/Sex: 12/30/1945 (72 y.o. Male) Treating RN: Secundino Ginger Primary Care Zeriyah Wain: [name] Other  Clinician: Referring Honore Wipperfurth: [name] Treating Jamey Harman/Extender: STONE III, HOYT Weeks in Treatment: 2 Edema Assessment Assessed: [Left: No] [Right: No] [Left: Edema] [Right: :] Calf Left: Right: Point of Measurement: 34 cm From Medial Instep cm cm Ankle Left: Right: Point of Measurement: 12 cm From Medial Instep cm cm Vascular Assessment Claudication: Claudication Assessment [Left:None] Pulses: Dorsalis Pedis Palpable: [Left:Yes] Posterior Tibial Extremity colors, hair growth, and conditions: Extremity Color: [Left:Hyperpigmented] Hair Growth on Extremity: [Left:No] Temperature of Extremity: [Left:Cool] Capillary Refill: [Left:< 3 seconds] Toe Nail Assessment Left: Right: Thick: No Discolored: No Deformed: No Improper Length and Hygiene: No Electronic Signature(s) Signed: 08/28/2018 4:12:55 PM By: Secundino Ginger Entered By: Secundino Ginger on 08/28/2018 12:53:29 [name] ([id number]) -------------------------------------------------------------------------------- Multi Wound Chart Details Patient Name: [name] Date of Service: 08/28/2018 12:30 PM Medical Record Number: [id number] Patient Account Number: [id number] Date of Birth/Sex: 09-10-1946 (72 y.o. 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] Comorbid History: [3:Cataracts, Glaucoma, Anemia, N/A Arrhythmia, Congestive Heart Failure, Hypertension, Type II Diabetes,  Osteoarthritis, Neuropathy] Date Acquired: [3:08/02/2018] [N/A:N/A] Weeks of Treatment: [3:2] [N/A:N/A] Wound Status: [3:Open] [N/A:N/A] Measurements L x W x D [3:4x1.8x0.2] [N/A:N/A] (cm) Area (cm) : [3:5.655] [N/A:N/A] Volume (cm) : [3:1.131] [N/A:N/A] % Reduction in Area: [3:-37.90%] [N/A:N/A] % Reduction in Volume: [3:-175.90%] [N/A:N/A] Classification: [3:Grade 2] [N/A:N/A] Exudate Amount: [3:Medium] [N/A:N/A] Exudate Type: [3:Serosanguineous] [N/A:N/A] Exudate Color: [3:red, brown] [N/A:N/A] Wound Margin: [3:Flat and Intact] [N/A:N/A] Granulation Amount: [3:Small (1-33%)] [N/A:N/A] Granulation Quality: [3:Red, Pale] [N/A:N/A] Necrotic Amount: [3:Medium (34-66%)] [N/A:N/A] Necrotic Tissue: [3:Eschar, Adherent Slough] [N/A:N/A]  Exposed Structures: [3:Fat Layer (Subcutaneous Tissue) Exposed: Yes Fascia: No Tendon: No Muscle: No Joint: No Bone: No] [N/A:N/A] Epithelialization: [3:Small (1-33%)] [N/A:N/A] Periwound Skin Texture: [N/A:N/A] Excoriation: No Induration: No Callus: No Crepitus: No Rash: No Scarring: No Periwound Skin Moisture: Maceration: No N/A N/A Dry/Scaly: No Periwound Skin Color: Hemosiderin Staining: Yes N/A N/A Atrophie Blanche: No Cyanosis: No Ecchymosis: No Erythema: No Mottled: No Pallor: No Rubor: No Tenderness on Palpation: No N/A N/A Wound Preparation: Ulcer Cleansing: Wound N/A N/A Cleanser Topical Anesthetic Applied: Xylocaine 4% Topical Solution Treatment Notes Electronic Signature(s) Signed: 08/29/2018 5:05:35 PM By: Harold Barban Entered By: Harold Barban on 08/28/2018 13:04:27 [name] ([id number]) -------------------------------------------------------------------------------- Multi-Disciplinary Care Plan Details Patient Name: [name] Date of Service: 08/28/2018 12:30 PM Medical Record Number: [id number] Patient Account Number: [id number] Date of Birth/Sex: Sep 04, 1946 (72 y.o. Male) Treating RN: Harold Barban Primary Care Tiago Humphrey: [name] Other Clinician: Referring Charrisse Masley: [name] Treating Imogen Maddalena/Extender: Melburn Hake, HOYT Weeks in Treatment: 2 Active Inactive Abuse / Safety / Falls / Self Care Management Nursing Diagnoses: Potential for falls Goals: Patient will not experience any injury related to falls Date Initiated: 08/08/2018 Target Resolution Date: 10/28/2018 Goal Status: Active Interventions: Assess fall risk on admission and as needed Notes: Orientation to the Wound Care Program Nursing Diagnoses: Knowledge deficit related to the wound healing center program Goals: Patient/caregiver will verbalize understanding of the [facility] Date Initiated: 08/08/2018 Target Resolution Date: 10/28/2018 Goal Status: Active Interventions: Provide education on orientation to the wound center Notes: Wound/Skin Impairment Nursing Diagnoses: Impaired tissue integrity Goals: Ulcer/skin breakdown will heal within 14 weeks Date Initiated: 08/08/2018 Target Resolution Date: 10/28/2018 Goal Status: Active Interventions: Assess patient/caregiver ability to obtain necessary supplies YUNIS, VOORHEIS ([id number]) Assess patient/caregiver ability to perform ulcer/skin care regimen upon admission and as needed Assess ulceration(s) every visit Notes: Electronic Signature(s) Signed: 08/29/2018 5:05:35 PM By: Harold Barban Entered By: Harold Barban on 08/28/2018 13:04:10 [name] ([id number]) -------------------------------------------------------------------------------- Pain Assessment Details Patient Name: [name] Date of Service: 08/28/2018 12:30 PM Medical Record Number: [id number] Patient Account Number: [id number] Date of Birth/Sex: [birth year] (72 y.o. Male) Treating RN: Secundino Ginger Primary Care Antionio Negron: [name] Other Clinician: Referring Doreather Hoxworth: [name] Treating Kahla Risdon/Extender: Melburn Hake,  HOYT Weeks in Treatment: 2 Active Problems Location of Pain Severity and Description of Pain Patient Has Paino No Site Locations Pain Management and Medication Current Pain Management: Notes pt denies any pain at this time. Electronic Signature(s) Signed: 08/28/2018 4:12:55 PM By: Secundino Ginger Entered By: Secundino Ginger on 08/28/2018 12:41:33 [name] ([id number]) -------------------------------------------------------------------------------- Patient/Caregiver Education Details Patient Name: [name] Date of Service: 08/28/2018 12:30 PM Medical Record Number: [id number] Patient Account Number: [id number] Date of Birth/Gender: 27-Nov-1945 (72 y.o. Male) Treating RN: Harold Barban Primary Care Physician: [name] Other Clinician: Referring Physician: [name] Treating Physician/Extender: Sharalyn Ink in Treatment: 2 Education Assessment Education Provided To: Patient Education Topics Provided Electronic Signature(s) Signed: 08/29/2018 5:05:35 PM By: Harold Barban Entered By: Harold Barban on 08/28/2018 13:05:28 [name] ([id number]) -------------------------------------------------------------------------------- Wound Assessment Details Patient Name: [name] Date of Service: 08/28/2018 12:30 PM Medical Record Number: [id number] Patient Account Number: [id number] Date of Birth/Sex: 01/12/1946 (72 y.o. Male) Treating RN: Secundino Ginger Primary Care Leotta Weingarten: [name] Other Clinician: Referring Shakara Tweedy: [name] Treating Bionca Mckey/Extender: STONE III, HOYT Weeks in Treatment: 2 Wound Status Wound Number: 3 Primary Diabetic Wound/Ulcer of the Lower Extremity Etiology: Wound Location: Left Lower Leg - Anterior Wound Open Wounding Event: Trauma Status: Date  Acquired: 08/02/2018 Comorbid Cataracts, Glaucoma, Anemia, Arrhythmia, Weeks Of Treatment: 2 History: Congestive Heart Failure, Hypertension, Type  II Clustered Wound: No Diabetes, Osteoarthritis, Neuropathy Photos Photo Uploaded By: Secundino Ginger on 08/28/2018 15:57:27 Wound Measurements Length: (cm) 4 Width: (cm) 1.8 Depth: (cm) 0.2 Area: (cm) 5.655 Volume: (cm) 1.131 % Reduction in Area: -37.9% % Reduction in Volume: -175.9% Epithelialization: Small (1-33%) Tunneling: No Undermining: No Wound Description Classification: Grade 2 Foul Odor A Wound Margin: Flat and Intact Slough/Fibr Exudate Amount: Medium Exudate Type: Serosanguineous Exudate Color: red, brown fter Cleansing: No ino Yes Wound Bed Granulation Amount: Small (1-33%) Exposed Structure Granulation Quality: Red, Pale Fascia Exposed: No Necrotic Amount: Medium (34-66%) Fat Layer (Subcutaneous Tissue) Exposed: Yes Necrotic Quality: Eschar, Adherent Slough Tendon Exposed: No Muscle Exposed: No Joint Exposed: No Bone Exposed: No Periwound Skin Texture Mcmenamin, Gloria ([id number]) Texture Color No Abnormalities Noted: No No Abnormalities Noted: No Callus: No Atrophie Blanche: No Crepitus: No Cyanosis: No Excoriation: No Ecchymosis: No Induration: No Erythema: No Rash: No Hemosiderin Staining: Yes Scarring: No Mottled: No Pallor: No Moisture Rubor: No No Abnormalities Noted: No Dry / Scaly: No Maceration: No Wound Preparation Ulcer Cleansing: Wound Cleanser Topical Anesthetic Applied: Xylocaine 4% Topical Solution Electronic Signature(s) Signed: 08/28/2018 4:12:55 PM By: Secundino Ginger Entered By: Secundino Ginger on 08/28/2018 12:52:52 [name] ([id number]) -------------------------------------------------------------------------------- [place] Details Patient Name: [name] Date of Service: 08/28/2018 12:30 PM Medical Record Number: [id number] Patient Account Number: [id number] Date of Birth/Sex: 08/29/1946 (72 y.o. Male) Treating RN: Secundino Ginger Primary Care Shardea Cwynar: [name] Other Clinician: Referring Otho Michalik:  [name] Treating Ladene Allocca/Extender: STONE III, HOYT Weeks in Treatment: 2 Vital Signs Time Taken: 12:41 Temperature (F): 98.5 Height (in): 68 Pulse (bpm): 70 Weight (lbs): 186.1 Respiratory Rate (breaths/min): 16 Body Mass Index (BMI): 28.3 Blood Pressure (mmHg): 140/74 Reference Range: 80 - 120 mg / dl Electronic Signature(s) Signed: 08/28/2018 4:12:55 PM By: Secundino Ginger Entered By: Secundino Ginger on 08/28/2018 12:45:09

## 2018-08-31 NOTE — Progress Notes (Signed)
[name], [name] ([id number]) Visit Report for 08/28/2018 Chief Complaint Document Details Patient Name: [name] Date of Service: 08/28/2018 12:30 PM Medical Record Number: [id number] Patient Account Number: [id number] Date of Birth/Sex: 07-31-1946 (72 y.o. Male) Treating RN: Harold Barban Primary Care Provider: [name] Other Clinician: Referring Provider: [name] Treating Provider/Extender: Melburn Hake, HOYT Weeks in Treatment: 2 Information Obtained from: Patient Chief Complaint Left LE ulcer Electronic Signature(s) Signed: 08/29/2018 7:20:38 AM By: Worthy Keeler PA-C Entered By: Worthy Keeler on 08/28/2018 13:00:56 [name], [name] ([id number]) -------------------------------------------------------------------------------- HPI Details Patient Name: [name] Date of Service: 08/28/2018 12:30 PM Medical Record Number: [id number] Patient Account Number: [id number] Date of Birth/Sex: March 15, 1946 (72 y.o. Male) Treating RN: Harold Barban Primary Care Provider: [name] Other Clinician: Referring Provider: [name] Treating Provider/Extender: Melburn Hake, HOYT Weeks in Treatment: 2 History of Present Illness HPI Description: 72 year old gentleman who was recently seen by his nephrologist [name], and noted to have a wound on his left lower extremity which was lacerated 2 months ago and now has reopened. The patient's left shin has a ulceration with some exudate but no evidence of infection and he was referred to [place] for further care as it was known that the patient has had some peripheral vascular disease in the past. Past medical history significant for chronic kidney disease, atrial fibrillation, diabetes mellitus,status post kidney transplant in 1983 and 2005, a week fistula graft placement, status post previous bowel surgery. 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 10/06/2015 -- x-ray of the left tibia and fibula shows no evidence of osteomyelitis. The patient has also had Doppler studies of his extremity and is awaiting the appointment with the vascular surgeon. We have not yet received these reports. 10/13/2015 -- lower extremity venous duplex reflux evaluation shows reflux in the left common femoral vein, left saphenofemoral junction and the proximal greater saphenous vein extending to the proximal calf. There is also reflux in the left proximal to mid small saphenous vein. Arterial duplex studies done showed the resting ABI was not applicable due to tibial artery medial calcification. 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 The right TBI was 1 which is normal and the left ABI was 1 which is normal. Patient has otherwise been doing fine and has been compliant with his dressings. 10/20/2015 -- He was seen by [name] recently for a vascular opinion on 10/15/2015. His left lower extremity venous insufficiency duplex study revealed GSV reflux,SS vein reflux and deep venous reflux in the common femoral vein. His ABIs were non compressible and his TBI on the right was 1.01 and on the left was 0.80. He was asked to continue with the wound care with compressive therapy followed by EVLA of the left GS vein 3 months. He recommended 20-30 mm thigh-high compression stockings and the need for a three-month trial of this. The patient had an Unna boot applied at the vascular office but he could not tolerate this with a lot of pain and issues with his toes and hence came here on Friday for removal of this and we reapplied a 2 layer compression. 11/10/2015 -- patient still has not purchased his 20-30 mm thigh-high compression stockings as prescribed by [name]. Readmission: 08/08/18 on evaluation today patient presents for  readmission concerning a new injury to the left anterior  lower extremity. He was previously seen in 2017 here in our clinic. He states that he has done fairly well since that point. Nonetheless he is having at this time some pain but states that he hit this on a table that fell over and actually struck his leg. This appears to have pulled back some of his skin which folded in on itself and is causing some difficulty as far as that is concerned. There does not appear to be any evidence of infection at this time. No fevers, chills, nausea, or vomiting noted at this time. He's been using dressings on his own currently without complication. 08/15/18 on evaluation today patient actually appears to be doing somewhat better in regard to his wound of the lower [name] when compared to the first visit last week. I had to do a much more extensive debridement at that time it does appear that I'm gonna have to perform some debridement today but it does not look to be as extensive by any means. Nonetheless fortunately he does not show any signs of infection he does have discomfort at this site. I believe based on what I'm seeing currently he may benefit from Iodoflex to help keep the wound bed clean. Patient tolerated therapy without complication. Upon evaluation today the patient actually appears to be doing excellent in regard to his left lower extremity ulcer. This is much better than the previous two visits where he had a lot of necrotic tissue around the edge of the wound simply due to the fact that again there was a significant skin tear where the edge had been [place], Denys ([id number]) cleared away prior to reattaching and being able to heal appropriately. He seems to be doing much better at this point. 08/28/18 on evaluation today the patient's wound actually does appear to be showing signs of improvement. With that being said though he is improving he would likely note even greater improvement if we  were able to sharply debride the wound. Nonetheless this caused him to much discomfort he tells me. Electronic Signature(s) Signed: 08/29/2018 7:20:38 AM By: Worthy Keeler PA-C Entered By: Worthy Keeler on 08/29/2018 06:45:52 [name], [name] ([id number]) -------------------------------------------------------------------------------- Physical Exam Details Patient Name: [name] Date of Service: 08/28/2018 12:30 PM Medical Record Number: [id number] Patient Account Number: [id number] Date of Birth/Sex: Mar 14, 1946 (72 y.o. Male) Treating RN: Harold Barban Primary Care Provider: [name] Other Clinician: Referring Provider: [name] Treating Provider/Extender: STONE III, HOYT Weeks in Treatment: 2 Constitutional Well-nourished and well-hydrated in no acute distress. Respiratory normal breathing without difficulty. clear to auscultation bilaterally. Cardiovascular regular rate and rhythm with normal S1, S2. no clubbing, cyanosis, significant edema, <3 sec cap refill. Psychiatric this patient is able to make decisions and demonstrates good insight into disease process. Alert and Oriented x 3. pleasant and cooperative. Notes The patient's wound bed upon inspection today actually show signs of good improvement and I do believe the Iodoflex is beneficial in this regard. It may take a little bit more time to clear away the surface of the wound to a much better surface however it is much less painful to him than what we were previously utilizing with sharp debridement. He does not want to go back to that. Electronic Signature(s) Signed: 08/29/2018 7:20:38 AM By: Worthy Keeler PA-C Entered By: Worthy Keeler on 08/29/2018 06:46:41 [name], [name] ([id number]) -------------------------------------------------------------------------------- Physician Orders Details Patient Name: [name] Date of Service: 08/28/2018 12:30 PM Medical Record Number:  [id number] Patient Account Number: [id number]  Date of Birth/Sex: 06-26-1946 (72 y.o. Male) Treating RN: Harold Barban Primary Care Provider: [name] Other Clinician: Referring Provider: [name] Treating Provider/Extender: Melburn Hake, HOYT Weeks in Treatment: 2 Verbal / Phone Orders: No Diagnosis Coding ICD-10 Coding Code Description E11.622 Type 2 diabetes mellitus with other skin ulcer L97.822 Non-pressure chronic ulcer of other part of left lower leg with fat layer exposed I73.9 Peripheral vascular disease, unspecified I87.312 Chronic venous hypertension (idiopathic) with ulcer of left lower extremity Z94.0 Kidney transplant status Wound Cleansing Wound #3 Left,Anterior Lower Leg o Cleanse wound with mild soap and water o May shower with protection. - Please do not get your wrap wet and call us if you do get it wet - [phone] Anesthetic (add to Medication List) Wound #3 Left,Anterior Lower Leg o Topical Lidocaine 4% cream applied to wound bed prior to debridement (In Clinic Only). Primary Wound Dressing Wound #3 Left,Anterior Lower Leg o Iodoflex Secondary Dressing Wound #3 Left,Anterior Lower Leg o ABD pad Dressing Change Frequency Wound #3 Left,Anterior Lower Leg o Change dressing every week Follow-up Appointments Wound #3 Left,Anterior Lower Leg o Return Appointment in 1 week. Edema Control Wound #3 Left,Anterior Lower Leg o Kerlix and Coban - Left Lower Extremity - may anchor with unna paste Additional Orders / Instructions Wound #3 Left,Anterior Lower Leg o OK to return to work [name], [name] ([id number]) o Activity as tolerated Electronic Signature(s) Signed: 08/29/2018 7:20:38 AM By: Worthy Keeler PA-C Signed: 08/29/2018 5:05:35 PM By: Harold Barban Entered By: Harold Barban on 08/28/2018 [place], [place]  ([id number]) -------------------------------------------------------------------------------- Problem List Details Patient Name: [name] Date of Service: 08/28/2018 12:30 PM Medical Record Number: [id number] Patient Account Number: [id number] Date of Birth/Sex: 1946/07/19 (72 y.o. Male) Treating RN: Harold Barban Primary Care Provider: [name] Other Clinician: Referring Provider: [name] Treating Provider/Extender: Melburn Hake, HOYT Weeks in Treatment: 2 Active Problems ICD-10 Evaluated Encounter Code Description Active Date Today Diagnosis E11.622 Type 2 diabetes mellitus with other skin ulcer 08/08/2018 No Yes L97.822 Non-pressure chronic ulcer of other part of left lower leg with 08/08/2018 No Yes fat layer exposed I73.9 Peripheral vascular disease, unspecified 08/08/2018 No Yes I87.312 Chronic venous hypertension (idiopathic) with ulcer of left 08/08/2018 No Yes lower extremity Z94.0 Kidney transplant status 08/08/2018 No Yes Inactive Problems Resolved Problems Electronic Signature(s) Signed: 08/29/2018 7:20:38 AM By: Worthy Keeler PA-C Entered By: Worthy Keeler on 08/28/2018 13:00:47 [name] ([id number]) -------------------------------------------------------------------------------- Progress Note Details Patient Name: [name] Date of Service: 08/28/2018 12:30 PM Medical Record Number: [id number] Patient Account Number: [id number] Date of Birth/Sex: 01/21/1946 (72 y.o. Male) Treating RN: Harold Barban Primary Care Provider: [name] Other Clinician: Referring Provider: [name] Treating Provider/Extender: Melburn Hake, HOYT Weeks in Treatment: 2 Subjective Chief Complaint Information obtained from Patient Left LE ulcer History of Present Illness (HPI) 72 year old gentleman who was recently seen by his nephrologist [name], and noted to have a wound on his left lower extremity which was  lacerated 2 months ago and now has reopened. The patient's left shin has a ulceration with some exudate but no evidence of infection and he was referred to [place] for further care as it was known that the patient has had some peripheral vascular disease in the past. Past medical history significant for chronic kidney disease, atrial fibrillation, diabetes mellitus,status post kidney transplant in 1983 and 2005, a week fistula graft placement, status post previous bowel surgery. 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 10/06/2015 -- x-ray of the left tibia and fibula shows no evidence of osteomyelitis. The patient has also had Doppler studies of his extremity and is awaiting the appointment with the vascular surgeon. We have not yet received these reports. 10/13/2015 -- lower extremity venous duplex reflux evaluation shows reflux in the left common femoral vein, left saphenofemoral junction and the proximal greater saphenous vein extending to the proximal calf. There is also reflux in the left proximal to mid small saphenous vein. Arterial duplex studies done showed the resting ABI was not applicable due to tibial artery medial calcification. 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 The right TBI was 1 which is normal and the left ABI was 1 which is normal. Patient has otherwise been doing fine and has been compliant with his dressings. 10/20/2015 -- He was seen by [name] recently for a vascular opinion on 10/15/2015. His left lower extremity venous insufficiency duplex study revealed GSV reflux,SS vein reflux and deep venous reflux in the common femoral vein. His ABIs were non compressible and his TBI on the right was 1.01 and on the left was 0.80. He was asked to continue with the wound care with compressive therapy followed by EVLA  of the left GS vein 3 months. He recommended 20-30 mm thigh-high compression stockings and the need for a three-month trial of this. The patient had an Unna boot applied at the vascular office but he could not tolerate this with a lot of pain and issues with his toes and hence came here on Friday for removal of this and we reapplied a 2 layer compression. 11/10/2015 -- patient still has not purchased his 20-30 mm thigh-high compression stockings as prescribed by [name]. Readmission: 08/08/18 on evaluation today patient presents for readmission concerning a new injury to the left anterior lower extremity. He was previously seen in 2017 here in our clinic. He states that he has done fairly well since that point. Nonetheless he is having at this time some pain but states that he hit this on a table that fell over and actually struck his leg. This appears to have pulled back some of his skin which folded in on itself and is causing some difficulty as far as that is concerned. There does not appear to be any evidence of infection at this time. No fevers, chills, nausea, or vomiting noted at this time. He's been using dressings on his own currently without complication. 08/15/18 on evaluation today patient actually appears to be doing somewhat better in regard to his wound of the lower [name] when compared to the first visit last week. I had to do a much more extensive debridement at that time it does appear that I'm gonna have to perform some debridement today but it does not look to be as extensive by any means. Nonetheless [name], [name] ([id number]) fortunately he does not show any signs of infection he does have discomfort at this site. I believe based on what I'm seeing currently he may benefit from Iodoflex to help keep the wound bed clean. Patient tolerated therapy without complication. Upon evaluation today the patient actually appears to be doing excellent in regard to his left lower  extremity ulcer. This is much better than the previous two visits where he had a lot of necrotic tissue around the edge of the wound simply  due to the fact that again there was a significant skin tear where the edge had been cleared away prior to reattaching and being able to heal appropriately. He seems to be doing much better at this point. 08/28/18 on evaluation today the patient's wound actually does appear to be showing signs of improvement. With that being said though he is improving he would likely note even greater improvement if we were able to sharply debride the wound. Nonetheless this caused him to much discomfort he tells me. Patient History Information obtained from Patient. Family History Hypertension - Maternal Grandparents, Kidney Disease - Maternal Grandparents, Stroke - Maternal Grandparents, No family history of Cancer, Diabetes, Heart Disease, Hereditary Spherocytosis, Lung Disease, Seizures, Thyroid Problems, Tuberculosis. Social History Former smoker - teen years, Marital Status - Married, Alcohol Use - Never - hx drinking on weekends, Drug Use - No History, Caffeine Use - Daily. Medical History Hospitalization/Surgery History - 07/09/2004, [facility] in [place], Kidney Transplant. Medical And Surgical History Notes Genitourinary pt had kidney transplant in 2005; CKD Review of Systems (ROS) Constitutional Symptoms (General Health) Denies complaints or symptoms of Fever, Chills. Respiratory The patient has no complaints or symptoms. Cardiovascular The patient has no complaints or symptoms. Psychiatric The patient has no complaints or symptoms. Objective Constitutional Well-nourished and well-hydrated in no acute distress. Vitals Time Taken: 12:41 PM, Height: 68 in, Weight: 186.1 lbs, BMI: 28.3, Temperature: 98.5 F, Pulse: 70 bpm, Respiratory Rate: 16 breaths/min, Blood Pressure: 140/74 mmHg. [name], [name] ([id number]) Respiratory normal breathing  without difficulty. clear to auscultation bilaterally. Cardiovascular regular rate and rhythm with normal S1, S2. no clubbing, cyanosis, significant edema, Psychiatric this patient is able to make decisions and demonstrates good insight into disease process. Alert and Oriented x 3. pleasant and cooperative. General Notes: The patient's wound bed upon inspection today actually show signs of good improvement and I do believe the Iodoflex is beneficial in this regard. It may take a little bit more time to clear away the surface of the wound to a much better surface however it is much less painful to him than what we were previously utilizing with sharp debridement. He does not want to go back to that. Integumentary (Hair, Skin) Wound #3 status is Open. Original cause of wound was Trauma. The wound is located on the Left,Anterior Lower Leg. The wound measures 4cm length x 1.8cm width x 0.2cm depth; 5.655cm^2 area and 1.131cm^3 volume. There is Fat Layer (Subcutaneous Tissue) Exposed exposed. There is no tunneling or undermining noted. There is a medium amount of serosanguineous drainage noted. The wound margin is flat and intact. There is small (1-33%) red, pale granulation within the wound bed. There is a medium (34-66%) amount of necrotic tissue within the wound bed including Eschar and Adherent Slough. The periwound skin appearance exhibited: Hemosiderin Staining. The periwound skin appearance did not exhibit: Callus, Crepitus, Excoriation, Induration, Rash, Scarring, Dry/Scaly, Maceration, Atrophie Blanche, Cyanosis, Ecchymosis, Mottled, Pallor, Rubor, Erythema. Assessment Active Problems ICD-10 Type 2 diabetes mellitus with other skin ulcer Non-pressure chronic ulcer of other part of left lower leg with fat layer exposed Peripheral vascular disease, unspecified Chronic venous hypertension (idiopathic) with ulcer of left lower extremity Kidney transplant status Plan Wound  Cleansing: Wound #3 Left,Anterior Lower Leg: Cleanse wound with mild soap and water May shower with protection. - Please do not get your wrap wet and call us if you do get it wet - [phone] Anesthetic (add to Medication List): Wound #3 Left,Anterior Lower Leg:  Topical Lidocaine 4% cream applied to wound bed prior to debridement (In Clinic Only). Primary Wound Dressing: Wound #3 Left,Anterior Lower Leg: Iodoflex Secondary Dressing: [name], [name] ([id number]) Wound #3 Left,Anterior Lower Leg: ABD pad Dressing Change Frequency: Wound #3 Left,Anterior Lower Leg: Change dressing every week Follow-up Appointments: Wound #3 Left,Anterior Lower Leg: Return Appointment in 1 week. Edema Control: Wound #3 Left,Anterior Lower Leg: Kerlix and Coban - Left Lower Extremity - may anchor with unna paste Additional Orders / Instructions: Wound #3 Left,Anterior Lower Leg: OK to return to work Activity as tolerated At this point my suggestion is going to be that we go ahead and continue with the Current wound care measures with the Iodoflex since it seems to be doing well. He is in agreement with the plan. Please see above for specific wound care orders. We will see patient for re-evaluation in 1 week(s) here in the clinic. If anything worsens or changes patient will contact our office for additional recommendations. Electronic Signature(s) Signed: 08/29/2018 7:20:38 AM By: Worthy Keeler PA-C Entered By: Worthy Keeler on 08/29/2018 06:47:00 [name] ([id number]) -------------------------------------------------------------------------------- ROS/PFSH Details Patient Name: [name] Date of Service: 08/28/2018 12:30 PM Medical Record Number: [id number] Patient Account Number: [id number] Date of Birth/Sex: Jun 20, 1946 (72 y.o. Male) Treating RN: Harold Barban Primary Care Provider: [name] Other Clinician: Referring Provider: [name] Treating  Provider/Extender: Melburn Hake, HOYT Weeks in Treatment: 2 Information Obtained From Patient Wound History Do you currently have one or more open woundso Yes How many open wounds do you currently haveo 1 Approximately how long have you had your woundso 1 week How have you been treating your wound(s) until nowo wwrapping compression, cleaning Has your wound(s) ever healed and then re-openedo No Have you had any lab work done in the past montho No Have you tested positive for an antibiotic resistant organism (MRSA, VRE)o No Have you tested positive for osteomyelitis (bone infection)o No Have you had other problems associated with your woundso Swelling Constitutional Symptoms (General Health) Complaints and Symptoms: Negative for: Fever; Chills Eyes Medical History: Positive for: Cataracts; Glaucoma - had sugery Negative for: Optic Neuritis Hematologic/Lymphatic Medical History: Positive for: Anemia Negative for: Hemophilia; Human Immunodeficiency Virus; Lymphedema; Sickle Cell Disease Respiratory Complaints and Symptoms: No Complaints or Symptoms Medical History: Negative for: Aspiration; Asthma; Chronic Obstructive Pulmonary Disease (COPD); Pneumothorax; Sleep Apnea; Tuberculosis Cardiovascular Complaints and Symptoms: No Complaints or Symptoms Medical History: Positive for: Arrhythmia - a-fib; Congestive Heart Failure; Hypertension Negative for: Angina; Coronary Artery Disease; Deep Vein Thrombosis; Hypotension; Myocardial Infarction; Peripheral Arterial Disease; Peripheral Venous Disease; Phlebitis; Vasculitis [name], [name] ([id number]) Endocrine Medical History: Positive for: Type II Diabetes Negative for: Type I Diabetes Time with diabetes: 2005 Treated with: Insulin, Oral agents Blood sugar tested every day: Yes Tested : 3 times a day Genitourinary Medical History: Negative for: End Stage Renal Disease Past Medical History Notes: pt had kidney transplant in  2005; CKD Immunological Medical History: Negative for: Lupus Erythematosus; Raynaudos; Scleroderma Integumentary (Skin) Medical History: Negative for: History of Burn; History of pressure wounds Musculoskeletal Medical History: Positive for: Osteoarthritis Negative for: Gout; Rheumatoid Arthritis; Osteomyelitis Neurologic Medical History: Positive for: Neuropathy Negative for: Dementia; Quadriplegia; Paraplegia; Seizure Disorder Oncologic Medical History: Negative for: Received Chemotherapy; Received Radiation Psychiatric Complaints and Symptoms: No Complaints or Symptoms Medical History: Negative for: Anorexia/bulimia; Confinement Anxiety HBO Extended History Items Eyes: Eyes: Cataracts Glaucoma Immunizations Pneumococcal Vaccine: Received Pneumococcal VaccinationROCK, [name] ([id number]) Tetanus Vaccine: Last tetanus shot: 08/02/2018 Implantable Devices  Hospitalization / Surgery History Name of Hospital Purpose of Hospitalization/Surgery Date [facility] in [place] Kidney Transplant 07/09/2004 Family and Social History Cancer: No; Diabetes: No; Heart Disease: No; Hereditary Spherocytosis: No; Hypertension: Yes - Maternal Grandparents; Kidney Disease: Yes - Maternal Grandparents; Lung Disease: No; Seizures: No; Stroke: Yes - Maternal Grandparents; Thyroid Problems: No; Tuberculosis: No; Former smoker - teen years; Marital Status - Married; Alcohol Use: Never - hx drinking on weekends; Drug Use: No History; Caffeine Use: Daily; Financial Concerns: No; Food, Clothing or Shelter Needs: No; Support System Lacking: No; Transportation Concerns: No; Advanced Directives: No; Patient does not want information on Advanced Directives; Do not resuscitate: No; Living Will: No; Medical Power of Attorney: No Physician Affirmation I have reviewed and agree with the above information. Electronic Signature(s) Signed: 08/29/2018 7:20:38 AM By: Worthy Keeler PA-C Signed:  08/29/2018 5:05:35 PM By: Harold Barban Entered By: Worthy Keeler on 08/29/2018 06:46:12 [name] ([id number]) -------------------------------------------------------------------------------- SuperBill Details Patient Name: [name] Date of Service: 08/28/2018 Medical Record Number: [id number] Patient Account Number: [id number] Date of Birth/Sex: 14-Nov-1945 (72 y.o. Male) Treating RN: Harold Barban Primary Care Provider: [name] Other Clinician: Referring Provider: [name] Treating Provider/Extender: Melburn Hake, HOYT Weeks in Treatment: 2 Diagnosis Coding ICD-10 Codes Code Description E11.622 Type 2 diabetes mellitus with other skin ulcer L97.822 Non-pressure chronic ulcer of other part of left lower leg with fat layer exposed I73.9 Peripheral vascular disease, unspecified I87.312 Chronic venous hypertension (idiopathic) with ulcer of left lower extremity Z94.0 Kidney transplant status Facility Procedures CPT4 Code: [id number] Description: 99213 - WOUND CARE VISIT-LEV 3 EST PT Modifier: Quantity: 1 Physician Procedures CPT4 Code Description: [id number] 99214 - WC PHYS LEVEL 4 - EST PT ICD-10 Diagnosis Description E11.622 Type 2 diabetes mellitus with other skin ulcer L97.822 Non-pressure chronic ulcer of other part of left lower leg wit I73.9 Peripheral vascular disease,  unspecified I87.312 Chronic venous hypertension (idiopathic) with ulcer of left lo Modifier: h fat layer expos wer extremity Quantity: 1 ed Electronic Signature(s) Signed: 08/29/2018 7:20:38 AM By: Worthy Keeler PA-C Entered By: Worthy Keeler on 08/29/2018 06:47:10

## 2018-09-04 ENCOUNTER — Encounter: Payer: Medicare Other | Admitting: Physician Assistant

## 2018-09-04 DIAGNOSIS — I129 Hypertensive chronic kidney disease with stage 1 through stage 4 chronic kidney disease, or unspecified chronic kidney disease: Secondary | ICD-10-CM | POA: Diagnosis not present

## 2018-09-04 DIAGNOSIS — L97822 Non-pressure chronic ulcer of other part of left lower leg with fat layer exposed: Secondary | ICD-10-CM | POA: Diagnosis not present

## 2018-09-04 DIAGNOSIS — I87312 Chronic venous hypertension (idiopathic) with ulcer of left lower extremity: Secondary | ICD-10-CM | POA: Diagnosis not present

## 2018-09-04 DIAGNOSIS — Z87891 Personal history of nicotine dependence: Secondary | ICD-10-CM | POA: Diagnosis not present

## 2018-09-04 DIAGNOSIS — E1151 Type 2 diabetes mellitus with diabetic peripheral angiopathy without gangrene: Secondary | ICD-10-CM | POA: Diagnosis not present

## 2018-09-04 DIAGNOSIS — N186 End stage renal disease: Secondary | ICD-10-CM | POA: Diagnosis not present

## 2018-09-04 DIAGNOSIS — E11622 Type 2 diabetes mellitus with other skin ulcer: Secondary | ICD-10-CM | POA: Diagnosis not present

## 2018-09-04 DIAGNOSIS — Z940 Kidney transplant status: Secondary | ICD-10-CM | POA: Diagnosis not present

## 2018-09-05 ENCOUNTER — Encounter (HOSPITAL_COMMUNITY): Payer: Self-pay | Admitting: *Deleted

## 2018-09-05 ENCOUNTER — Inpatient Hospital Stay (HOSPITAL_COMMUNITY)
Admission: EM | Admit: 2018-09-05 | Discharge: 2018-09-11 | DRG: 871 | Disposition: A | Discharge status: Home Health | Payer: Medicare Other | Attending: Internal Medicine | Admitting: Internal Medicine

## 2018-09-05 ENCOUNTER — Other Ambulatory Visit: Payer: Self-pay

## 2018-09-05 DIAGNOSIS — A419 Sepsis, unspecified organism: Secondary | ICD-10-CM | POA: Diagnosis not present

## 2018-09-05 DIAGNOSIS — G9340 Encephalopathy, unspecified: Secondary | ICD-10-CM | POA: Diagnosis present

## 2018-09-05 DIAGNOSIS — N189 Chronic kidney disease, unspecified: Secondary | ICD-10-CM

## 2018-09-05 DIAGNOSIS — J189 Pneumonia, unspecified organism: Secondary | ICD-10-CM | POA: Diagnosis present

## 2018-09-05 DIAGNOSIS — E872 Acidosis, unspecified: Secondary | ICD-10-CM

## 2018-09-05 DIAGNOSIS — E785 Hyperlipidemia, unspecified: Secondary | ICD-10-CM | POA: Diagnosis present

## 2018-09-05 DIAGNOSIS — E870 Hyperosmolality and hypernatremia: Secondary | ICD-10-CM | POA: Diagnosis present

## 2018-09-05 DIAGNOSIS — N179 Acute kidney failure, unspecified: Secondary | ICD-10-CM | POA: Diagnosis not present

## 2018-09-05 DIAGNOSIS — G9341 Metabolic encephalopathy: Secondary | ICD-10-CM | POA: Diagnosis present

## 2018-09-05 DIAGNOSIS — Z79899 Other long term (current) drug therapy: Secondary | ICD-10-CM

## 2018-09-05 DIAGNOSIS — R0989 Other specified symptoms and signs involving the circulatory and respiratory systems: Secondary | ICD-10-CM | POA: Diagnosis not present

## 2018-09-05 DIAGNOSIS — E860 Dehydration: Secondary | ICD-10-CM | POA: Diagnosis present

## 2018-09-05 DIAGNOSIS — Z7952 Long term (current) use of systemic steroids: Secondary | ICD-10-CM

## 2018-09-05 DIAGNOSIS — M109 Gout, unspecified: Secondary | ICD-10-CM | POA: Diagnosis present

## 2018-09-05 DIAGNOSIS — Z940 Kidney transplant status: Secondary | ICD-10-CM

## 2018-09-05 DIAGNOSIS — D899 Disorder involving the immune mechanism, unspecified: Secondary | ICD-10-CM

## 2018-09-05 DIAGNOSIS — R52 Pain, unspecified: Secondary | ICD-10-CM

## 2018-09-05 DIAGNOSIS — E1142 Type 2 diabetes mellitus with diabetic polyneuropathy: Secondary | ICD-10-CM | POA: Diagnosis present

## 2018-09-05 DIAGNOSIS — I4892 Unspecified atrial flutter: Secondary | ICD-10-CM | POA: Diagnosis present

## 2018-09-05 DIAGNOSIS — E1165 Type 2 diabetes mellitus with hyperglycemia: Secondary | ICD-10-CM | POA: Diagnosis present

## 2018-09-05 DIAGNOSIS — Z8249 Family history of ischemic heart disease and other diseases of the circulatory system: Secondary | ICD-10-CM

## 2018-09-05 DIAGNOSIS — D696 Thrombocytopenia, unspecified: Secondary | ICD-10-CM | POA: Diagnosis present

## 2018-09-05 DIAGNOSIS — T8619 Other complication of kidney transplant: Secondary | ICD-10-CM | POA: Diagnosis not present

## 2018-09-05 DIAGNOSIS — I480 Paroxysmal atrial fibrillation: Secondary | ICD-10-CM | POA: Diagnosis present

## 2018-09-05 DIAGNOSIS — D849 Immunodeficiency, unspecified: Secondary | ICD-10-CM | POA: Diagnosis present

## 2018-09-05 DIAGNOSIS — Z888 Allergy status to other drugs, medicaments and biological substances status: Secondary | ICD-10-CM

## 2018-09-05 DIAGNOSIS — Z7982 Long term (current) use of aspirin: Secondary | ICD-10-CM

## 2018-09-05 DIAGNOSIS — D539 Nutritional anemia, unspecified: Secondary | ICD-10-CM

## 2018-09-05 DIAGNOSIS — R4182 Altered mental status, unspecified: Secondary | ICD-10-CM | POA: Diagnosis not present

## 2018-09-05 DIAGNOSIS — E119 Type 2 diabetes mellitus without complications: Secondary | ICD-10-CM

## 2018-09-05 DIAGNOSIS — Y830 Surgical operation with transplant of whole organ as the cause of abnormal reaction of the patient, or of later complication, without mention of misadventure at the time of the procedure: Secondary | ICD-10-CM | POA: Diagnosis present

## 2018-09-05 DIAGNOSIS — I5032 Chronic diastolic (congestive) heart failure: Secondary | ICD-10-CM | POA: Diagnosis present

## 2018-09-05 DIAGNOSIS — Z794 Long term (current) use of insulin: Secondary | ICD-10-CM

## 2018-09-05 DIAGNOSIS — J181 Lobar pneumonia, unspecified organism: Secondary | ICD-10-CM

## 2018-09-05 DIAGNOSIS — N183 Chronic kidney disease, stage 3 (moderate): Secondary | ICD-10-CM | POA: Diagnosis present

## 2018-09-05 DIAGNOSIS — E1122 Type 2 diabetes mellitus with diabetic chronic kidney disease: Secondary | ICD-10-CM | POA: Diagnosis present

## 2018-09-05 DIAGNOSIS — I130 Hypertensive heart and chronic kidney disease with heart failure and stage 1 through stage 4 chronic kidney disease, or unspecified chronic kidney disease: Secondary | ICD-10-CM | POA: Diagnosis present

## 2018-09-05 DIAGNOSIS — D631 Anemia in chronic kidney disease: Secondary | ICD-10-CM | POA: Diagnosis present

## 2018-09-05 LAB — CBC
HCT: 31.7 % — ABNORMAL LOW (ref 39.0–52.0)
Hemoglobin: 9 g/dL — ABNORMAL LOW (ref 13.0–17.0)
MCH: 28.4 pg (ref 26.0–34.0)
MCHC: 28.4 g/dL — ABNORMAL LOW (ref 30.0–36.0)
MCV: 100 fL (ref 80.0–100.0)
NRBC: 0 % (ref 0.0–0.2)
PLATELETS: 172 10*3/uL (ref 150–400)
RBC: 3.17 MIL/uL — ABNORMAL LOW (ref 4.22–5.81)
RDW: 13.6 % (ref 11.5–15.5)
WBC: 5.7 10*3/uL (ref 4.0–10.5)

## 2018-09-05 LAB — COMPREHENSIVE METABOLIC PANEL
ALT: 13 U/L (ref 0–44)
ANION GAP: 10 (ref 5–15)
AST: 30 U/L (ref 15–41)
Albumin: 3.7 g/dL (ref 3.5–5.0)
Alkaline Phosphatase: 89 U/L (ref 38–126)
BUN: 61 mg/dL — ABNORMAL HIGH (ref 8–23)
CO2: 14 mmol/L — ABNORMAL LOW (ref 22–32)
Calcium: 10 mg/dL (ref 8.9–10.3)
Chloride: 124 mmol/L — ABNORMAL HIGH (ref 98–111)
Creatinine, Ser: 2.56 mg/dL — ABNORMAL HIGH (ref 0.61–1.24)
GFR calc Af Amer: 28 mL/min — ABNORMAL LOW (ref >60)
GFR calc non Af Amer: 24 mL/min — ABNORMAL LOW (ref >60)
Glucose, Bld: 216 mg/dL — ABNORMAL HIGH (ref 70–99)
Potassium: 5.2 mmol/L — ABNORMAL HIGH (ref 3.5–5.1)
Sodium: 148 mmol/L — ABNORMAL HIGH (ref 135–145)
Total Bilirubin: 0.7 mg/dL (ref 0.3–1.2)
Total Protein: 6.7 g/dL (ref 6.5–8.1)

## 2018-09-05 LAB — CBG MONITORING, ED: GLUCOSE-CAPILLARY: 179 mg/dL — AB (ref 70–99)

## 2018-09-05 NOTE — ED Notes (Signed)
Bed: FJ01 Expected date:  Expected time:  Means of arrival:  Comments: Mcdiarmid when bed is ready :-)

## 2018-09-05 NOTE — ED Triage Notes (Signed)
Pt lives with his spouse who reports since yesterday he has had some confusion, not eating well, has not taken any of his medications since yesterday. Pt is oriented to self and place.

## 2018-09-06 ENCOUNTER — Emergency Department (HOSPITAL_COMMUNITY): Payer: Medicare Other

## 2018-09-06 DIAGNOSIS — N179 Acute kidney failure, unspecified: Secondary | ICD-10-CM

## 2018-09-06 DIAGNOSIS — Z940 Kidney transplant status: Secondary | ICD-10-CM

## 2018-09-06 DIAGNOSIS — E119 Type 2 diabetes mellitus without complications: Secondary | ICD-10-CM

## 2018-09-06 DIAGNOSIS — D899 Disorder involving the immune mechanism, unspecified: Secondary | ICD-10-CM

## 2018-09-06 DIAGNOSIS — J181 Lobar pneumonia, unspecified organism: Secondary | ICD-10-CM | POA: Diagnosis not present

## 2018-09-06 DIAGNOSIS — D631 Anemia in chronic kidney disease: Secondary | ICD-10-CM | POA: Diagnosis not present

## 2018-09-06 DIAGNOSIS — N189 Chronic kidney disease, unspecified: Secondary | ICD-10-CM

## 2018-09-06 DIAGNOSIS — J189 Pneumonia, unspecified organism: Secondary | ICD-10-CM | POA: Diagnosis present

## 2018-09-06 DIAGNOSIS — G9340 Encephalopathy, unspecified: Secondary | ICD-10-CM | POA: Diagnosis present

## 2018-09-06 DIAGNOSIS — G9341 Metabolic encephalopathy: Secondary | ICD-10-CM | POA: Diagnosis not present

## 2018-09-06 DIAGNOSIS — E870 Hyperosmolality and hypernatremia: Secondary | ICD-10-CM

## 2018-09-06 DIAGNOSIS — R0989 Other specified symptoms and signs involving the circulatory and respiratory systems: Secondary | ICD-10-CM | POA: Diagnosis not present

## 2018-09-06 DIAGNOSIS — R4182 Altered mental status, unspecified: Secondary | ICD-10-CM | POA: Diagnosis not present

## 2018-09-06 DIAGNOSIS — Z794 Long term (current) use of insulin: Secondary | ICD-10-CM | POA: Diagnosis not present

## 2018-09-06 LAB — PROCALCITONIN: Procalcitonin: 0.15 ng/mL

## 2018-09-06 LAB — CBC
HCT: 30.6 % — ABNORMAL LOW (ref 39.0–52.0)
Hemoglobin: 8.5 g/dL — ABNORMAL LOW (ref 13.0–17.0)
MCH: 29.4 pg (ref 26.0–34.0)
MCHC: 27.8 g/dL — ABNORMAL LOW (ref 30.0–36.0)
MCV: 105.9 fL — ABNORMAL HIGH (ref 80.0–100.0)
Platelets: 140 10*3/uL — ABNORMAL LOW (ref 150–400)
RBC: 2.89 MIL/uL — ABNORMAL LOW (ref 4.22–5.81)
RDW: 13.7 % (ref 11.5–15.5)
WBC: 6.6 10*3/uL (ref 4.0–10.5)
nRBC: 0 % (ref 0.0–0.2)

## 2018-09-06 LAB — CBG MONITORING, ED
Glucose-Capillary: 193 mg/dL — ABNORMAL HIGH (ref 70–99)
Glucose-Capillary: 193 mg/dL — ABNORMAL HIGH (ref 70–99)

## 2018-09-06 LAB — RENAL FUNCTION PANEL
Albumin: 3.5 g/dL (ref 3.5–5.0)
Anion gap: 10 (ref 5–15)
BUN: 56 mg/dL — ABNORMAL HIGH (ref 8–23)
CO2: 11 mmol/L — ABNORMAL LOW (ref 22–32)
Calcium: 9.5 mg/dL (ref 8.9–10.3)
Chloride: 127 mmol/L — ABNORMAL HIGH (ref 98–111)
Creatinine, Ser: 2.42 mg/dL — ABNORMAL HIGH (ref 0.61–1.24)
GFR calc Af Amer: 30 mL/min — ABNORMAL LOW (ref >60)
GFR calc non Af Amer: 26 mL/min — ABNORMAL LOW (ref >60)
Glucose, Bld: 219 mg/dL — ABNORMAL HIGH (ref 70–99)
POTASSIUM: 5.2 mmol/L — AB (ref 3.5–5.1)
Phosphorus: 3.1 mg/dL (ref 2.5–4.6)
Sodium: 148 mmol/L — ABNORMAL HIGH (ref 135–145)

## 2018-09-06 LAB — RAPID URINE DRUG SCREEN, HOSP PERFORMED
Amphetamines: NOT DETECTED
Barbiturates: NOT DETECTED
Benzodiazepines: NOT DETECTED
Cocaine: NOT DETECTED
Opiates: POSITIVE — AB
Tetrahydrocannabinol: NOT DETECTED

## 2018-09-06 LAB — URINALYSIS, ROUTINE W REFLEX MICROSCOPIC
Bacteria, UA: NONE SEEN
Bilirubin Urine: NEGATIVE
Glucose, UA: NEGATIVE mg/dL
Ketones, ur: 5 mg/dL — AB
Leukocytes, UA: NEGATIVE
Nitrite: NEGATIVE
PROTEIN: 30 mg/dL — AB
SPECIFIC GRAVITY, URINE: 1.011 (ref 1.005–1.030)
pH: 5 (ref 5.0–8.0)

## 2018-09-06 LAB — I-STAT CG4 LACTIC ACID, ED
Lactic Acid, Venous: 1.07 mmol/L (ref 0.5–1.9)
Lactic Acid, Venous: 1.29 mmol/L (ref 0.5–1.9)

## 2018-09-06 LAB — GLUCOSE, CAPILLARY
Glucose-Capillary: 180 mg/dL — ABNORMAL HIGH (ref 70–99)
Glucose-Capillary: 232 mg/dL — ABNORMAL HIGH (ref 70–99)

## 2018-09-06 LAB — STREP PNEUMONIAE URINARY ANTIGEN: Strep Pneumo Urinary Antigen: NEGATIVE

## 2018-09-06 LAB — TSH: TSH: 2.422 u[IU]/mL (ref 0.350–4.500)

## 2018-09-06 MED ORDER — LIP MEDEX EX OINT
1.0000 "application " | TOPICAL_OINTMENT | CUTANEOUS | Status: DC | PRN
  Filled 2018-09-06 (×2): qty 7

## 2018-09-06 MED ORDER — PREDNISONE 5 MG PO TABS
5.0000 mg | ORAL_TABLET | Freq: Every day | ORAL | Status: DC
  Administered 2018-09-07: 5 mg via ORAL
  Filled 2018-09-06 (×3): qty 1

## 2018-09-06 MED ORDER — ONDANSETRON HCL 4 MG/2ML IJ SOLN: 4.0000 mg | Freq: Four times a day (QID) | INTRAMUSCULAR | Status: DC | PRN

## 2018-09-06 MED ORDER — SODIUM CHLORIDE 0.9% FLUSH
3.0000 mL | Freq: Two times a day (BID) | INTRAVENOUS | Status: DC
  Administered 2018-09-06 – 2018-09-11 (×9): 3 mL via INTRAVENOUS

## 2018-09-06 MED ORDER — SODIUM CHLORIDE 0.9 % IV SOLN
500.0000 mg | INTRAVENOUS | Status: DC
  Administered 2018-09-06 – 2018-09-08 (×3): 500 mg via INTRAVENOUS
  Filled 2018-09-06 (×3): qty 500

## 2018-09-06 MED ORDER — ATORVASTATIN CALCIUM 10 MG PO TABS
10.0000 mg | ORAL_TABLET | Freq: Every day | ORAL | Status: DC
  Administered 2018-09-07 – 2018-09-11 (×5): 10 mg via ORAL
  Filled 2018-09-06 (×6): qty 1

## 2018-09-06 MED ORDER — MYCOPHENOLATE MOFETIL 250 MG PO CAPS
500.0000 mg | ORAL_CAPSULE | Freq: Two times a day (BID) | ORAL | Status: DC
  Administered 2018-09-06 – 2018-09-11 (×12): 500 mg via ORAL
  Filled 2018-09-06 (×13): qty 2

## 2018-09-06 MED ORDER — INSULIN ASPART 100 UNIT/ML ~~LOC~~ SOLN
0.0000 [IU] | Freq: Three times a day (TID) | SUBCUTANEOUS | Status: DC
  Administered 2018-09-06 (×3): 3 [IU] via SUBCUTANEOUS
  Administered 2018-09-07: 11 [IU] via SUBCUTANEOUS
  Filled 2018-09-06 (×2): qty 1

## 2018-09-06 MED ORDER — ALBUTEROL SULFATE (2.5 MG/3ML) 0.083% IN NEBU: 2.5000 mg | INHALATION_SOLUTION | Freq: Four times a day (QID) | RESPIRATORY_TRACT | Status: DC | PRN

## 2018-09-06 MED ORDER — CARVEDILOL 6.25 MG PO TABS
6.2500 mg | ORAL_TABLET | Freq: Two times a day (BID) | ORAL | Status: DC
  Administered 2018-09-06 – 2018-09-11 (×12): 6.25 mg via ORAL
  Filled 2018-09-06 (×13): qty 1

## 2018-09-06 MED ORDER — GABAPENTIN 100 MG PO CAPS
100.0000 mg | ORAL_CAPSULE | Freq: Three times a day (TID) | ORAL | Status: DC | PRN
  Administered 2018-09-06 – 2018-09-09 (×2): 100 mg via ORAL
  Filled 2018-09-06 (×2): qty 1

## 2018-09-06 MED ORDER — HYDRALAZINE HCL 25 MG PO TABS
25.0000 mg | ORAL_TABLET | Freq: Three times a day (TID) | ORAL | Status: DC
  Administered 2018-09-06 (×2): 25 mg via ORAL
  Filled 2018-09-06 (×4): qty 1

## 2018-09-06 MED ORDER — LOSARTAN POTASSIUM 25 MG PO TABS
25.0000 mg | ORAL_TABLET | Freq: Every day | ORAL | Status: DC
  Filled 2018-09-06: qty 1

## 2018-09-06 MED ORDER — ASPIRIN EC 81 MG PO TBEC
81.0000 mg | DELAYED_RELEASE_TABLET | Freq: Every day | ORAL | Status: DC
  Administered 2018-09-07 – 2018-09-11 (×5): 81 mg via ORAL
  Filled 2018-09-06 (×5): qty 1

## 2018-09-06 MED ORDER — POLYVINYL ALCOHOL 1.4 % OP SOLN
1.0000 [drp] | OPHTHALMIC | Status: DC | PRN
  Filled 2018-09-06: qty 15

## 2018-09-06 MED ORDER — TACROLIMUS 1 MG PO CAPS
1.0000 mg | ORAL_CAPSULE | Freq: Two times a day (BID) | ORAL | Status: DC
  Administered 2018-09-06 – 2018-09-11 (×12): 1 mg via ORAL
  Filled 2018-09-06 (×13): qty 1

## 2018-09-06 MED ORDER — SODIUM CHLORIDE 0.9 % IV SOLN
INTRAVENOUS | Status: DC
  Administered 2018-09-06 – 2018-09-07 (×3): via INTRAVENOUS

## 2018-09-06 MED ORDER — SODIUM CHLORIDE 0.9 % IV SOLN
2.0000 g | INTRAVENOUS | Status: DC
  Administered 2018-09-06 – 2018-09-07 (×2): 2 g via INTRAVENOUS
  Filled 2018-09-06: qty 20
  Filled 2018-09-06: qty 2

## 2018-09-06 MED ORDER — SODIUM CHLORIDE 0.9 % IV BOLUS
1000.0000 mL | Freq: Once | INTRAVENOUS | Status: AC
  Administered 2018-09-06: 1000 mL via INTRAVENOUS

## 2018-09-06 MED ORDER — LATANOPROST 0.005 % OP SOLN
1.0000 [drp] | Freq: Every day | OPHTHALMIC | Status: DC
  Administered 2018-09-08 – 2018-09-11 (×5): 1 [drp] via OPHTHALMIC
  Filled 2018-09-06 (×2): qty 2.5

## 2018-09-06 MED ORDER — INSULIN ASPART 100 UNIT/ML ~~LOC~~ SOLN
0.0000 [IU] | Freq: Every day | SUBCUTANEOUS | Status: DC
  Administered 2018-09-06: 2 [IU] via SUBCUTANEOUS

## 2018-09-06 MED ORDER — CALCITRIOL 0.25 MCG PO CAPS
0.2500 ug | ORAL_CAPSULE | ORAL | Status: DC
  Administered 2018-09-08 – 2018-09-11 (×2): 0.25 ug via ORAL
  Filled 2018-09-06 (×3): qty 1

## 2018-09-06 MED ORDER — HEPARIN SODIUM (PORCINE) 5000 UNIT/ML IJ SOLN
5000.0000 [IU] | Freq: Three times a day (TID) | INTRAMUSCULAR | Status: DC
  Administered 2018-09-06 – 2018-09-09 (×10): 5000 [IU] via SUBCUTANEOUS
  Filled 2018-09-06 (×11): qty 1

## 2018-09-06 MED ORDER — ONDANSETRON HCL 4 MG PO TABS: 4.0000 mg | ORAL_TABLET | Freq: Four times a day (QID) | ORAL | Status: DC | PRN

## 2018-09-06 NOTE — H&P (Addendum)
History and Physical    [name] LNL:[id number] DOB: [birth year] DOA: 09/05/2018  Referring MD/NP/PA: Delora Fuel, MD  PCP: [name], MD  Patient coming from: Home  Chief Complaint: Altered mental status  I have personally briefly reviewed patient's old medical records in [facility]   HPI: [name] is a 72 y.o. male with medical history significant of PAF, DM type II, s/p renal transplant, and HTN; who presents after being found to be more altered.  History was obtained from the patient's wife as he is currently altered.  He reportedly has had a cough since last week.  Wife notes that during last week he had fevers up to 102F and some loose stools.  Over the last 2 days patient has had no appetite and is not taking all of his medications.  She reports that he has been incoherent and complaining of significant pain out of his left leg.  He had sustained a laceration to the left leg 2 months ago and it had reopened for which he had been being seen at the wound care center.  Wife notes that the wound had been improving.  The only recent medication change that she reports is that he was started on hydralazine by his nephrologist help with kidney function.    ED Course: Patient into the emergency department patient was noted to be afebrile with vital signs relatively within normal limits.  Labs revealed WBC 5.7, hemoglobin 9, sodium 148, potassium 5.2, chloride 124, CO2 14, BUN 61, creatinine 2.56, glucose 216, and lactic acid 1.07.  CT scan of the head showed no acute abnormalities although was limited due to motion.  Chest x-ray showing possibly right lower lobe opacity versus atelectasis.  Urinalysis did not show clear signs of infection.  Blood cultures were obtained, patient was given 1 L normal saline IV fluids, and started on empiric antibiotics of Rocephin and azithromycin for possible pneumonia.  TRH called to admit.   Review of Systems  Unable to perform ROS:  Mental status change  Constitutional: Positive for chills, fever and malaise/fatigue.  Eyes: Negative for photophobia and pain.  Respiratory: Positive for cough.   Cardiovascular: Negative for leg swelling.  Gastrointestinal: Positive for diarrhea. Negative for blood in stool.  Musculoskeletal: Positive for myalgias.  Neurological: Negative for loss of consciousness.  Psychiatric/Behavioral: Negative for substance abuse.    Past Medical History:  Diagnosis Date  . Anemia   . Atrial fibrillation ([place])   . Chronic kidney disease   . Diabetes mellitus   . Dysrhythmia   . Hyperlipidemia   . Neuropathy     Past Surgical History:  Procedure Laterality Date  . access proceds    . achlles tendon repair    . ARTERIOVENOUS GRAFT PLACEMENT    . bowel repla     repair  . [place] and 2005  . LIGATION GORETEX GRAFT  06/30/11   Right upper arm     reports that he has never smoked. He has never used smokeless tobacco. He reports that he does not drink alcohol or use drugs.  Allergies  Allergen Reactions  . Elavil [Amitriptyline Hcl]     Unknown felt as if he was tripping    Family History  Problem Relation Age of Onset  . Heart disease Mother   . Heart attack Father   . Stroke Sister     Prior to Admission medications   Medication Sig Start Date End Date Taking?  Authorizing Provider  acetaminophen (TYLENOL) 500 MG tablet Take 500 mg by mouth as needed (1-2x daily prn pain).    [provider]  Alcohol Swabs (B-D SINGLE USE SWABS REGULAR) PADS Use as directed 09/07/15   [provider]  aspirin 81 MG tablet Take 81 mg by mouth daily.      [provider]  atorvastatin (LIPITOR) 10 MG tablet Take 10 mg by mouth daily.    [provider]  BAYER CONTOUR NEXT TEST test strip Use as directed 09/12/15   [provider]  BD PEN NEEDLE NANO U/F 32G X 4 MM MISC Use as directed 09/25/15   [provider]  calcitRIOL  (ROCALTROL) 0.25 MCG capsule Take 0.25 mcg by mouth 3 (three) times a week. (Mondays, Wednesdays, and Fridays) 07/22/15   [provider]  carvedilol (COREG) 6.25 MG tablet Take 1 tablet (6.25 mg total) by mouth 2 (two) times daily. 09/29/17   Belva Crome, MD  diphenhydrAMINE (BENADRYL) 25 mg capsule Take 25 mg by mouth at bedtime as needed (for sleep/allergies.).     [provider]  furosemide (LASIX) 40 MG tablet Take 1 tablet (40 mg total) by mouth daily. Fluid. 04/25/17   Belva Crome, MD  gabapentin (NEURONTIN) 100 MG capsule Take 100 mg by mouth 3 (three) times daily as needed (for nerve pain). Take three times/day as needed    [provider]  HUMALOG KWIKPEN 100 UNIT/ML KwikPen Inject 3-5 Units into the skin 3 (three) times daily. 07/17/18   [provider]  HYDROcodone-acetaminophen (NORCO/VICODIN) 5-325 MG tablet Take 1 tablet by mouth every 8 (eight) hours as needed for severe pain. 08/02/18   Lorin Glass, PA-C  LANTUS SOLOSTAR 100 UNIT/ML Solostar Pen Inject 7-15 Units into the skin daily. If blood sugars are >300, use 15 units, <300 use 7 units 06/04/14   [provider]  latanoprost (XALATAN) 0.005 % ophthalmic solution Place 1 drop into both eyes at bedtime. 07/28/18   [provider]  linagliptin (TRADJENTA) 5 MG TABS tablet Take 5 mg by mouth daily.      [provider]  losartan (COZAAR) 25 MG tablet TAKE 1 TABLET DAILY. PLEASE MAKE YEARLY APPOINTMENT WITH DR Tamala Julian FOR JULY BEFORE ANY MORE REFILLS. FIRST ATTEMPT. [phone]. Patient taking differently: Take 25 mg by mouth daily.  05/25/18   Belva Crome, MD  MICROLET LANCETS MISC Use as directed 07/14/15   [provider]  mycophenolate (CELLCEPT) 500 MG tablet Take 500 mg by mouth 2 (two) times daily.     [provider]  predniSONE (DELTASONE) 5 MG tablet Take 5 mg by mouth daily. 07/07/18   [provider]  tacrolimus (PROGRAF) 1  MG capsule Take 1 mg by mouth 2 (two) times daily.     [provider]  VASCEPA 1 g CAPS Take 4 g by mouth daily. 11/24/16   [provider]    Physical Exam:  Constitutional: Elderly male who appears restless and willing to sit down Vitals:   09/05/18 2203 09/06/18 0030  BP: (!) 159/75 137/69  Pulse: 81 79  Resp: 13 18  Temp: 99.2 F (37.3 C)   TempSrc: Oral   SpO2: 93% 98%  Weight: 77.1 kg   Height: 5\' 9"  (1.753 m)    Eyes: PERRL, lids and conjunctivae normal ENMT: Mucous membranes are moist. Posterior pharynx clear of any exudate or lesions.Normal dentition.  Neck: normal, supple, no masses, no thyromegaly Respiratory:  Decreased aeration noted on the right lower lung field with rales present. Cardiovascular: Regularly irregular, no murmurs / rubs / gallops. No extremity edema. 2+ pedal pulses. No carotid bruits.  Abdomen: no tenderness, no masses palpated. No hepatosplenomegaly. Bowel sounds positive.  Musculoskeletal: no clubbing / cyanosis. No joint deformity upper and lower extremities. Good ROM, no contractures. Normal muscle tone.  Skin: Ulceration present at the left lower extremity that appears to be healing without significant signs of erythema.  Poor skin turgor. Neurologic: CN 2-12 grossly intact. Sensation intact, DTR normal. Strength 5/5 in all 4.  Psychiatric: Patient is alert, but is disoriented and agitated.    Labs on Admission: I have personally reviewed following labs and imaging studies  CBC: Recent Labs  Lab 08/30/18 1015 09/05/18 2211  WBC  --  5.7  HGB 8.9* 9.0*  HCT  --  31.7*  MCV  --  100.0  PLT  --  921   Basic Metabolic Panel: Recent Labs  Lab 09/05/18 2211  NA 148*  K 5.2*  CL 124*  CO2 14*  GLUCOSE 216*  BUN 61*  CREATININE 2.56*  CALCIUM 10.0   GFR: Estimated Creatinine Clearance: 26.5 mL/min (A) (by C-G formula based on SCr of 2.56 mg/dL (H)). Liver Function Tests: Recent Labs  Lab 09/05/18 2211  AST 30    ALT 13  ALKPHOS 89  BILITOT 0.7  PROT 6.7  ALBUMIN 3.7   No results for input(s): LIPASE, AMYLASE in the last 168 hours. No results for input(s): AMMONIA in the last 168 hours. Coagulation Profile: No results for input(s): INR, PROTIME in the last 168 hours. Cardiac Enzymes: No results for input(s): CKTOTAL, CKMB, CKMBINDEX, TROPONINI in the last 168 hours. BNP (last 3 results) No results for input(s): PROBNP in the last 8760 hours. HbA1C: No results for input(s): HGBA1C in the last 72 hours. CBG: Recent Labs  Lab 09/05/18 2203  GLUCAP 179*   Lipid Profile: No results for input(s): CHOL, HDL, LDLCALC, TRIG, CHOLHDL, LDLDIRECT in the last 72 hours. Thyroid Function Tests: No results for input(s): TSH, T4TOTAL, FREET4, T3FREE, THYROIDAB in the last 72 hours. Anemia Panel: No results for input(s): VITAMINB12, FOLATE, FERRITIN, TIBC, IRON, RETICCTPCT in the last 72 hours. Urine analysis:    Component Value Date/Time   COLORURINE YELLOW 09/06/2018 0016   APPEARANCEUR CLEAR 09/06/2018 0016   LABSPEC 1.011 09/06/2018 0016   PHURINE 5.0 09/06/2018 0016   GLUCOSEU NEGATIVE 09/06/2018 0016   HGBUR SMALL (A) 09/06/2018 0016   BILIRUBINUR NEGATIVE 09/06/2018 0016   KETONESUR 5 (A) 09/06/2018 0016   PROTEINUR 30 (A) 09/06/2018 0016   NITRITE NEGATIVE 09/06/2018 0016   LEUKOCYTESUR NEGATIVE 09/06/2018 0016   Sepsis Labs: Recent Results (from the past 240 hour(s))  Blood Culture (routine x 2)     Status: None (Preliminary result)   Collection Time: 09/06/18  1:22 AM  Result Value Ref Range Status   Specimen Description   Final    BLOOD LEFT FOREARM Performed at [facility], [place] [address], [place], [place] [address]    Special Requests   Final    BOTTLES DRAWN AEROBIC AND ANAEROBIC Blood Culture adequate volume Performed at [facility] [address], [place], [place] [address]    Culture PENDING  Incomplete   Report Status PENDING  Incomplete      Radiological Exams on Admission: Dg Chest 2 View  Result Date: 09/06/2018 CLINICAL DATA:  Cough.  Altered mental status. EXAM: CHEST -  2 VIEW COMPARISON:  Chest x-ray dated December 11, 2015. FINDINGS: The heart remains at the upper limits of normal in size. Mild pulmonary vascular congestion. Mild patchy density in the medial right lower lobe. No pleural effusion or pneumothorax. No acute osseous abnormality. IMPRESSION: 1. Right lower lobe atelectasis versus infiltrate. 2. Mild pulmonary vascular congestion. Electronically Signed   By: Titus Dubin M.D.   On: 09/06/2018 00:42   Ct Head Wo Contrast  Result Date: 09/06/2018 CLINICAL DATA:  Altered mental status. EXAM: CT HEAD WITHOUT CONTRAST TECHNIQUE: Contiguous axial images were obtained from the base of the skull through the vertex without intravenous contrast. COMPARISON:  None. FINDINGS: Despite efforts by the technologist and patient, motion artifact is present on today's exam and could not be eliminated. This reduces exam sensitivity and specificity. Brain: No evidence of acute infarction, hemorrhage, hydrocephalus, extra-axial collection or mass lesion/mass effect. Mild to moderate age related cerebral atrophy. Vascular: Calcified atherosclerosis at the skullbase. No hyperdense vessel. Skull: Normal. Negative for fracture or focal lesion. Sinuses/Orbits: No acute finding. Other: None. IMPRESSION: 1. Motion limited study. No definite acute intracranial abnormality. Electronically Signed   By: Titus Dubin M.D.   On: 09/06/2018 01:41    EKG: Independently reviewed.  Atrial flutter with variable AV block at 84 bpm   Assessment/Plan Acute metabolic encephalopathy: Patient presents acutely altered and disoriented not readily following commands.  CT imaging of the brain although poor quality did not show any acute abnormalities.  Urinalysis was negative for signs of infection and chest x-ray gave concern for possible pneumonia.  Suspect  symptoms likely multifactorial in the setting of dehydration, possible infection, and/or medication changes. - Admit to a telemetry bed - Follow-up blood cultures - Check urine drug screen and TSH - Neurochecks - Sitter to bedside for patient safety  Suspected community-acquired pneumonia: Recently noted to have cough with fever.  Chest x-ray showing right lower lobe opacity concerning for atelectasis versus pneumonia. - Check procalcitonin  - Check sputum cultures - Continue empiric antibiotics of Rocephin and azithromycin and de-escalate when medically appropriate  Acute kidney injury superimposed on chronic kidney disease: Patient presents with running 2.56 with BUN 61.  Elevated BUN to creatinine ratio suggests prerenal cause of symptoms.  Previous creatinine noted to be around 2.04 in 05/2017.  Patient followed by [facility] of nephrology.  Patient was given 1 L of IV fluids  - Monitor intake and output - IV fluids normal saline 100 mL/h - Recheck renal function panel in a.m. - Hold nephrotoxic agents   Hypernatremia: Acute.  Initial sodium noted to be 148 on admission.  In addition to acute kidney injury suggest dehydration. - IV fluids as seen above  History of renal transplant on mmunosuppressive therapy: Status post transplant in 2005 on chronic immunosuppressive therapy. - Check Prograf level - Continue prednisone, Calcitrol, CellCept, and Prograf  Atrial flutter/history of diastolic heart failure: Chronic.  Patient appears rate controlled.  Last echocardiogram from 02/17/2017 noted EF of 50 to 55%.  Patient is not on Coumadin - Continue Coreg  Anemia of chronic kidney disease: Hemoglobin 9 on admission.  Previously noted to be 8.9 on 12/11.  No reports of bleeding per family. - Check CBC in a.m.  Diabetes mellitus type 2: Patient presents with mildly elevated blood glucose of 216. - Hypoglycemic protocol - Hold Tradjenta - CBGs q. before meals and at bedtime with  moderate sliding scale insulin - Restart patient's home insulin regimen when   Essential hypertension: On  admission patient blood pressures noted to be elevated up to 159/75. - Hold furosemide - Continue Coreg, hydralazine, and losartan DVT prophylaxis: Heparin Code Status: Full Family Communication: Discussed plan of care with the patient and family present bedside Disposition Plan: To be determined Consults called: None Admission status: Observation  Norval Morton MD [facility] Pager [phone]   If 7PM-7AM, please contact night-coverage [email or web address] Password [facility]  09/06/2018, 2:38 AM

## 2018-09-06 NOTE — Progress Notes (Signed)
Patient is a 72 year old gentleman admitted this morning with acute encephalopathy secondary to pneumonia as well as acute kidney injury.  Patient has been combative requiring a Actuary.  He has however improved in the last few hours.  Renal function has gotten worse.  He is status post renal transplant.  We will continue close monitoring with antibiotics.  Awaiting culture and sensitivity results.

## 2018-09-06 NOTE — Progress Notes (Signed)
Received pt from ED, with Safety Sitter, pt restless at times. Hand mittens in place. Pt need reminder to stay safe, impulsive and attempts to get up, especially for bathroom needs. Constant reminders needed. Telemetry on and confirmed. SRP, RN

## 2018-09-06 NOTE — ED Notes (Signed)
Pt AMS with confusion, has been attempting to pull at and remove IV lines and equipment when awake. Not combative.

## 2018-09-06 NOTE — ED Notes (Signed)
ED TO INPATIENT HANDOFF REPORT  Name/Age/Gender [name] 72 y.o. male  Code Status    Code Status Orders  (From admission, onward)         Start     Ordered   09/06/18 0256  Full code  Continuous     09/06/18 0258        Code Status History    This patient has a current code status but no historical code status.    Advance Directive Documentation     Most Recent Value  Type of Advance Directive  Healthcare Power of Attorney  Pre-existing out of facility DNR order (yellow form or pink MOST form)  -  "MOST" Form in Place?  -      Home/SNF/Other Home  Chief Complaint not alert   Level of Care/Admitting Diagnosis ED Disposition    ED Disposition Condition [place]: [facility] [[id number]]  Level of Care: Telemetry [5]  Admit to tele based on following criteria: Complex arrhythmia (Bradycardia/Tachycardia)  Diagnosis: Acute metabolic encephalopathy [1517616]  Admitting Physician: Norval Morton [[id number]]  Attending Physician: Norval Morton [[id number]]  PT Class (Do Not Modify): Observation [104]  PT Acc Code (Do Not Modify): Observation [10022]       Medical History Past Medical History:  Diagnosis Date  . Anemia   . Atrial fibrillation ([place])   . Chronic kidney disease   . Diabetes mellitus   . Dysrhythmia   . Hyperlipidemia   . Neuropathy     Allergies Allergies  Allergen Reactions  . Elavil [Amitriptyline Hcl]     Unknown felt as if he was tripping    IV Location/Drains/Wounds Patient Lines/Drains/Airways Status   Active Line/Drains/Airways    Name:   Placement date:   Placement time:   Site:   Days:   Peripheral IV 09/06/18 Left;Lateral Forearm   09/06/18    0123    Forearm   less than 1          Labs/Imaging Results for orders placed or performed during the hospital encounter of 09/05/18 (from the past 48 hour(s))  CBG monitoring, ED     Status: Abnormal   Collection Time: 09/05/18  10:03 PM  Result Value Ref Range   Glucose-Capillary 179 (H) 70 - 99 mg/dL  Comprehensive metabolic panel     Status: Abnormal   Collection Time: 09/05/18 10:11 PM  Result Value Ref Range   Sodium 148 (H) 135 - 145 mmol/L   Potassium 5.2 (H) 3.5 - 5.1 mmol/L   Chloride 124 (H) 98 - 111 mmol/L   CO2 14 (L) 22 - 32 mmol/L   Glucose, Bld 216 (H) 70 - 99 mg/dL   BUN 61 (H) 8 - 23 mg/dL   Creatinine, Ser 2.56 (H) 0.61 - 1.24 mg/dL   Calcium 10.0 8.9 - 10.3 mg/dL   Total Protein 6.7 6.5 - 8.1 g/dL   Albumin 3.7 3.5 - 5.0 g/dL   AST 30 15 - 41 U/L   ALT 13 0 - 44 U/L   Alkaline Phosphatase 89 38 - 126 U/L   Total Bilirubin 0.7 0.3 - 1.2 mg/dL   GFR calc non Af Amer 24 (L) >60 mL/min   GFR calc Af Amer 28 (L) >60 mL/min   Anion gap 10 5 - 15    Comment: Performed at [facility], [place] [address], [place],  [address]  CBC     Status: Abnormal  Collection Time: 09/05/18 10:11 PM  Result Value Ref Range   WBC 5.7 4.0 - 10.5 K/uL   RBC 3.17 (L) 4.22 - 5.81 MIL/uL   Hemoglobin 9.0 (L) 13.0 - 17.0 g/dL   HCT 31.7 (L) 39.0 - 52.0 %   MCV 100.0 80.0 - 100.0 fL   MCH 28.4 26.0 - 34.0 pg   MCHC 28.4 (L) 30.0 - 36.0 g/dL   RDW 13.6 11.5 - 15.5 %   Platelets 172 150 - 400 K/uL   nRBC 0.0 0.0 - 0.2 %    Comment: Performed at [facility], [place] [address] [place], [place] [address]  Urinalysis, Routine w reflex microscopic     Status: Abnormal   Collection Time: 09/06/18 12:16 AM  Result Value Ref Range   Color, Urine YELLOW YELLOW   APPearance CLEAR CLEAR   Specific Gravity, Urine 1.011 1.005 - 1.030   pH 5.0 5.0 - 8.0   Glucose, UA NEGATIVE NEGATIVE mg/dL   Hgb urine dipstick SMALL (A) NEGATIVE   Bilirubin Urine NEGATIVE NEGATIVE   Ketones, ur 5 (A) NEGATIVE mg/dL   Protein, ur 30 (A) NEGATIVE mg/dL   Nitrite NEGATIVE NEGATIVE   Leukocytes, UA NEGATIVE NEGATIVE   RBC / HPF 0-5 0 - 5 RBC/hpf   WBC, UA 0-5 0 - 5 WBC/hpf   Bacteria, UA NONE  SEEN NONE SEEN   Mucus PRESENT     Comment: Performed at [facility], [place] [address] [place], [place] [address]  Blood Culture (routine x 2)     Status: None (Preliminary result)   Collection Time: 09/06/18  1:22 AM  Result Value Ref Range   Specimen Description      BLOOD LEFT FOREARM Performed at [facility], [place] [address], [place], [place] [address]    Special Requests      BOTTLES DRAWN AEROBIC AND ANAEROBIC Blood Culture adequate volume Performed at [facility] [address] [place], [place] [address]    Culture PENDING    Report Status PENDING   I-Stat CG4 Lactic Acid, ED     Status: None   Collection Time: 09/06/18  1:32 AM  Result Value Ref Range   Lactic Acid, Venous 1.07 0.5 - 1.9 mmol/L  Urine rapid drug screen (hosp performed)     Status: Abnormal   Collection Time: 09/06/18  3:45 AM  Result Value Ref Range   Opiates POSITIVE (A) NONE DETECTED   Cocaine NONE DETECTED NONE DETECTED   Benzodiazepines NONE DETECTED NONE DETECTED   Amphetamines NONE DETECTED NONE DETECTED   Tetrahydrocannabinol NONE DETECTED NONE DETECTED   Barbiturates NONE DETECTED NONE DETECTED    Comment: (NOTE) DRUG SCREEN FOR MEDICAL PURPOSES ONLY.  IF CONFIRMATION IS NEEDED FOR ANY PURPOSE, NOTIFY LAB WITHIN 5 DAYS. LOWEST DETECTABLE LIMITS FOR URINE DRUG SCREEN Drug Class                     Cutoff (ng/mL) Amphetamine and metabolites    1000 Barbiturate and metabolites    200 Benzodiazepine                 092 Tricyclics and metabolites     300 Opiates and metabolites        300 Cocaine and metabolites        300 THC  50 Performed at [facility], [place] [address], [place], [place] [address]   Strep pneumoniae urinary antigen     Status: None   Collection Time: 09/06/18  3:45 AM  Result Value Ref Range   Strep Pneumo Urinary Antigen NEGATIVE NEGATIVE    Comment:        Infection due to S.  pneumoniae cannot be absolutely ruled out since the antigen present may be below the detection limit of the test. Performed at [facility], [address], [place], [place] [address]   Renal function panel     Status: Abnormal   Collection Time: 09/06/18  3:56 AM  Result Value Ref Range   Sodium 148 (H) 135 - 145 mmol/L   Potassium 5.2 (H) 3.5 - 5.1 mmol/L   Chloride 127 (H) 98 - 111 mmol/L   CO2 11 (L) 22 - 32 mmol/L   Glucose, Bld 219 (H) 70 - 99 mg/dL   BUN 56 (H) 8 - 23 mg/dL   Creatinine, Ser 2.42 (H) 0.61 - 1.24 mg/dL   Calcium 9.5 8.9 - 10.3 mg/dL   Phosphorus 3.1 2.5 - 4.6 mg/dL   Albumin 3.5 3.5 - 5.0 g/dL   GFR calc non Af Amer 26 (L) >60 mL/min   GFR calc Af Amer 30 (L) >60 mL/min   Anion gap 10 5 - 15    Comment: Performed at [facility], [place] [address], [place], [place] [address]  TSH     Status: None   Collection Time: 09/06/18  3:56 AM  Result Value Ref Range   TSH 2.422 0.350 - 4.500 uIU/mL    Comment: Performed by a 3rd Generation assay with a functional sensitivity of <=0.01 uIU/mL. Performed at [facility], [place] [address], [place], [place] [address]   Procalcitonin - Baseline     Status: None   Collection Time: 09/06/18  3:56 AM  Result Value Ref Range   Procalcitonin 0.15 ng/mL    Comment:        Interpretation: PCT (Procalcitonin) <= 0.5 ng/mL: Systemic infection (sepsis) is not likely. Local bacterial infection is possible. (NOTE)       Sepsis PCT Algorithm           Lower Respiratory Tract                                      Infection PCT Algorithm    ----------------------------     ----------------------------         PCT < 0.25 ng/mL                PCT < 0.10 ng/mL         Strongly encourage             Strongly discourage   discontinuation of antibiotics    initiation of antibiotics    ----------------------------     -----------------------------       PCT 0.25 - 0.50 ng/mL            PCT 0.10  - 0.25 ng/mL               OR       >80% decrease in PCT            Discourage initiation of  antibiotics      Encourage discontinuation           of antibiotics    ----------------------------     -----------------------------         PCT >= 0.50 ng/mL              PCT 0.26 - 0.50 ng/mL               AND        <80% decrease in PCT             Encourage initiation of                                             antibiotics       Encourage continuation           of antibiotics    ----------------------------     -----------------------------        PCT >= 0.50 ng/mL                  PCT > 0.50 ng/mL               AND         increase in PCT                  Strongly encourage                                      initiation of antibiotics    Strongly encourage escalation           of antibiotics                                     -----------------------------                                           PCT <= 0.25 ng/mL                                                 OR                                        > 80% decrease in PCT                                     Discontinue / Do not initiate                                             antibiotics Performed at [place] [address], [place], [place] [address]   CBC     Status: Abnormal   Collection Time: 09/06/18  3:57  AM  Result Value Ref Range   WBC 6.6 4.0 - 10.5 K/uL   RBC 2.89 (L) 4.22 - 5.81 MIL/uL   Hemoglobin 8.5 (L) 13.0 - 17.0 g/dL   HCT 30.6 (L) 39.0 - 52.0 %   MCV 105.9 (H) 80.0 - 100.0 fL   MCH 29.4 26.0 - 34.0 pg   MCHC 27.8 (L) 30.0 - 36.0 g/dL   RDW 13.7 11.5 - 15.5 %   Platelets 140 (L) 150 - 400 K/uL   nRBC 0.0 0.0 - 0.2 %    Comment: Performed at [facility], [place] [address], [place], [place] [address]  I-Stat CG4 Lactic Acid, ED     Status: None   Collection Time: 09/06/18  4:06 AM  Result Value Ref Range   Lactic  Acid, Venous 1.29 0.5 - 1.9 mmol/L  CBG monitoring, ED     Status: Abnormal   Collection Time: 09/06/18  8:03 AM  Result Value Ref Range   Glucose-Capillary 193 (H) 70 - 99 mg/dL  CBG monitoring, ED     Status: Abnormal   Collection Time: 09/06/18  2:34 PM  Result Value Ref Range   Glucose-Capillary 193 (H) 70 - 99 mg/dL   Dg Chest 2 View  Result Date: 09/06/2018 CLINICAL DATA:  Cough.  Altered mental status. EXAM: CHEST - 2 VIEW COMPARISON:  Chest x-ray dated December 11, 2015. FINDINGS: The heart remains at the upper limits of normal in size. Mild pulmonary vascular congestion. Mild patchy density in the medial right lower lobe. No pleural effusion or pneumothorax. No acute osseous abnormality. IMPRESSION: 1. Right lower lobe atelectasis versus infiltrate. 2. Mild pulmonary vascular congestion. Electronically Signed   By: Titus Dubin M.D.   On: 09/06/2018 00:42   Ct Head Wo Contrast  Result Date: 09/06/2018 CLINICAL DATA:  Altered mental status. EXAM: CT HEAD WITHOUT CONTRAST TECHNIQUE: Contiguous axial images were obtained from the base of the skull through the vertex without intravenous contrast. COMPARISON:  None. FINDINGS: Despite efforts by the technologist and patient, motion artifact is present on today's exam and could not be eliminated. This reduces exam sensitivity and specificity. Brain: No evidence of acute infarction, hemorrhage, hydrocephalus, extra-axial collection or mass lesion/mass effect. Mild to moderate age related cerebral atrophy. Vascular: Calcified atherosclerosis at the skullbase. No hyperdense vessel. Skull: Normal. Negative for fracture or focal lesion. Sinuses/Orbits: No acute finding. Other: None. IMPRESSION: 1. Motion limited study. No definite acute intracranial abnormality. Electronically Signed   By: Titus Dubin M.D.   On: 09/06/2018 01:41   EKG Interpretation  Date/Time:  Wednesday September 06 2018 01:35:51 EST Ventricular Rate:  84 PR Interval:     QRS Duration: 125 QT Interval:  401 QTC Calculation: 448 R Axis:   -64 Text Interpretation:  Atrial flutter with varied AV block, Nonspecific IVCD with LAD Nonspecific T abnormalities, lateral leads Minimal ST elevation, anterior leads When compared with ECG of 06/30/2011, Premature ventricular complexes are no longer present Confirmed by Delora Fuel ([id number]) on 09/06/2018 1:40:11 AM Also confirmed by Delora Fuel ([id number]), editor Philomena Doheny [phone])  on 09/06/2018 7:37:03 AM   Pending Labs Unresulted Labs (From admission, onward)    Start     Ordered   09/06/18 0300  Culture, sputum-assessment  Once,   R    Question:  Patient immune status  Answer:  Immunocompromised   09/06/18 0259   09/06/18 0300  Gram stain  Once,   R    Question:  Patient immune  status  Answer:  Immunocompromised   09/06/18 0259   09/06/18 0300  Legionella Pneumophila Serogp 1 Ur Ag  Once,   R     09/06/18 0259   09/06/18 0250  Tacrolimus level  Once,   R     09/06/18 0258   09/06/18 0050  Blood Culture (routine x 2)  BLOOD CULTURE X 2,   STAT     09/06/18 0051          Vitals/Pain Today's Vitals   09/06/18 1330 09/06/18 1400 09/06/18 1406 09/06/18 1438  BP: (!) 174/113 (!) 163/107 (!) 163/107 (!) 192/79  Pulse: 71  71 85  Resp: (!) 30 15 15 19   Temp:      TempSrc:      SpO2: (!) 84%  (!) 84% 99%  Weight:      Height:      PainSc:        Isolation Precautions No active isolations  Medications Medications  cefTRIAXone (ROCEPHIN) 2 g in sodium chloride 0.9 % 100 mL IVPB (0 g Intravenous Stopped 09/06/18 0214)  azithromycin (ZITHROMAX) 500 mg in sodium chloride 0.9 % 250 mL IVPB (0 mg Intravenous Stopped 09/06/18 0343)  latanoprost (XALATAN) 0.005 % ophthalmic solution 1 drop (has no administration in time range)  heparin injection 5,000 Units (5,000 Units Subcutaneous Given 09/06/18 0546)  sodium chloride flush (NS) 0.9 % injection 3 mL (3 mLs Intravenous Not Given 09/06/18 0309)  0.9 %   sodium chloride infusion ( Intravenous New Bag/Given 09/06/18 0338)  ondansetron (ZOFRAN) tablet 4 mg (has no administration in time range)    Or  ondansetron (ZOFRAN) injection 4 mg (has no administration in time range)  albuterol (PROVENTIL) (2.5 MG/3ML) 0.083% nebulizer solution 2.5 mg (has no administration in time range)  aspirin EC tablet 81 mg (81 mg Oral Not Given 09/06/18 1000)  carvedilol (COREG) tablet 6.25 mg (6.25 mg Oral Not Given 09/06/18 1000)  hydrALAZINE (APRESOLINE) tablet 25 mg (25 mg Oral Not Given 09/06/18 1000)  atorvastatin (LIPITOR) tablet 10 mg (10 mg Oral Not Given 09/06/18 1000)  losartan (COZAAR) tablet 25 mg (25 mg Oral Not Given 09/06/18 1304)  calcitRIOL (ROCALTROL) capsule 0.25 mcg (0.25 mcg Oral Not Given 09/06/18 1000)  mycophenolate (CELLCEPT) capsule 500 mg (500 mg Oral Not Given 09/06/18 1304)  tacrolimus (PROGRAF) capsule 1 mg (1 mg Oral Not Given 09/06/18 1000)  gabapentin (NEURONTIN) capsule 100 mg (has no administration in time range)  predniSONE (DELTASONE) tablet 5 mg (5 mg Oral Not Given 09/06/18 1305)  insulin aspart (novoLOG) injection 0-15 Units (3 Units Subcutaneous Given 09/06/18 1442)  insulin aspart (novoLOG) injection 0-5 Units (has no administration in time range)  sodium chloride 0.9 % bolus 1,000 mL (0 mLs Intravenous Stopped 09/06/18 0343)    Mobility Walks, Normally

## 2018-09-06 NOTE — ED Provider Notes (Signed)
[facility] DEPT Provider Note   CSN: [id number] Arrival date & time: 09/05/18  2150     History   Chief Complaint Chief Complaint  Patient presents with  . Altered Mental Status    HPI [name] is a 72 y.o. male.  The history is provided by the patient and the spouse. The history is limited by the condition of the patient (Alterred mental status).  Altered Mental Status    He has a history of diabetes, hyperlipidemia, hypertension, renal transplant, diastolic heart failure and is brought in by his wife because of altered mental status since yesterday.  She noticed that he was not answering questions normally and seemed confused.  There is been no fever, chills, sweats.  There is been no nausea or vomiting.  He does relate mild diarrhea today.  There is been no cough.  Past Medical History:  Diagnosis Date  . Anemia   . Atrial fibrillation ([place])   . Chronic kidney disease   . Diabetes mellitus   . Dysrhythmia   . Hyperlipidemia   . Neuropathy     Patient Active Problem List   Diagnosis Date Noted  . Chronic diastolic heart failure ([place]) 03/25/2017  . Essential hypertension 03/25/2017  . Chronic anticoagulation 02/04/2017  . Dyspnea 02/03/2017  . 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 Kidney transplant status, cadaveric 10/15/2015  . Immunosuppressed status ([place]) 10/15/2015  . Paronychia of third finger of right hand 07/16/2011  . ESRD (end stage renal disease) ([place]) 07/16/2011    Past Surgical History:  Procedure Laterality Date  . access proceds    . achlles tendon repair    . ARTERIOVENOUS GRAFT PLACEMENT    . bowel repla     repair  . [place] and 2005  . LIGATION GORETEX GRAFT  06/30/11   Right upper arm        Home Medications    Prior to Admission medications   Medication Sig Start Date End Date Taking? Authorizing Provider  acetaminophen (TYLENOL) 500 MG  tablet Take 500 mg by mouth as needed (1-2x daily prn pain).    [provider]  Alcohol Swabs (B-D SINGLE USE SWABS REGULAR) PADS Use as directed 09/07/15   [provider]  aspirin 81 MG tablet Take 81 mg by mouth daily.      [provider]  atorvastatin (LIPITOR) 10 MG tablet Take 10 mg by mouth daily.    [provider]  BAYER CONTOUR NEXT TEST test strip Use as directed 09/12/15   [provider]  BD PEN NEEDLE NANO U/F 32G X 4 MM MISC Use as directed 09/25/15   [provider]  calcitRIOL (ROCALTROL) 0.25 MCG capsule Take 0.25 mcg by mouth 3 (three) times a week. (Mondays, Wednesdays, and Fridays) 07/22/15   [provider]  carvedilol (COREG) 6.25 MG tablet Take 1 tablet (6.25 mg total) by mouth 2 (two) times daily. 09/29/17   Belva Crome, MD  diphenhydrAMINE (BENADRYL) 25 mg capsule Take 25 mg by mouth at bedtime as needed (for sleep/allergies.).     [provider]  furosemide (LASIX) 40 MG tablet Take 1 tablet (40 mg total) by mouth daily. Fluid. 04/25/17   Belva Crome, MD  gabapentin (NEURONTIN) 100 MG capsule Take 100 mg by mouth 3 (three) times daily as needed (for nerve pain). Take three times/day as needed    [provider]  HUMALOG KWIKPEN 100 UNIT/ML KwikPen Inject 3-5 Units into the skin 3 (three) times daily. 07/17/18   [provider]  HYDROcodone-acetaminophen (NORCO/VICODIN) 5-325 MG tablet Take 1 tablet by mouth every 8 (eight) hours as needed for severe pain. 08/02/18   Lorin Glass, PA-C  LANTUS SOLOSTAR 100 UNIT/ML Solostar Pen Inject 7-15 Units into the skin daily. If blood sugars are >300, use 15 units, <300 use 7 units 06/04/14   [provider]  latanoprost (XALATAN) 0.005 % ophthalmic solution Place 1 drop into both eyes at bedtime. 07/28/18   [provider]  linagliptin (TRADJENTA) 5 MG TABS tablet Take 5 mg by mouth daily.      [provider]    losartan (COZAAR) 25 MG tablet TAKE 1 TABLET DAILY. PLEASE MAKE YEARLY APPOINTMENT WITH DR Tamala Julian FOR JULY BEFORE ANY MORE REFILLS. FIRST ATTEMPT. [phone]. Patient taking differently: Take 25 mg by mouth daily.  05/25/18   Belva Crome, MD  MICROLET LANCETS MISC Use as directed 07/14/15   [provider]  mycophenolate (CELLCEPT) 500 MG tablet Take 500 mg by mouth 2 (two) times daily.     [provider]  predniSONE (DELTASONE) 5 MG tablet Take 5 mg by mouth daily. 07/07/18   [provider]  tacrolimus (PROGRAF) 1 MG capsule Take 1 mg by mouth 2 (two) times daily.     [provider]  VASCEPA 1 g CAPS Take 4 g by mouth daily. 11/24/16   [provider]    Family History Family History  Problem Relation Age of Onset  . Heart disease Mother   . Heart attack Father   . Stroke Sister     Social History Social History   Tobacco Use  . Smoking status: Never Smoker  . Smokeless tobacco: Never Used  Substance Use Topics  . Alcohol use: No    Alcohol/week: 0.0 standard drinks  . Drug use: No     Allergies   Elavil [amitriptyline hcl]   Review of Systems Review of Systems  Unable to perform ROS: Mental status change     Physical Exam Updated Vital Signs BP (!) 159/75 (BP Location: Left Arm)   Pulse 81   Temp 99.2 F (37.3 C) (Oral)   Resp 13   Ht 5\' 9"  (1.753 m)   Wt 77.1 kg   SpO2 93%   BMI 25.10 kg/m   Physical Exam Vitals signs and nursing note reviewed.    72 year old male, resting comfortably and in no acute distress. Vital signs are significant for elevated systolic blood pressure. Oxygen saturation is 93%, which is normal. Head is normocephalic and atraumatic. PERRLA, EOMI. Oropharynx is clear. Neck is nontender and supple without adenopathy or JVD. Back is nontender and there is no CVA tenderness. Lungs are clear without rales, wheezes, or rhonchi. Chest is nontender. Heart has regular rate and rhythm  without murmur. Abdomen is soft, flat, nontender without masses or hepatosplenomegaly and peristalsis is normoactive. Extremities have no cyanosis or edema, full range of motion is present.  Shallow ulcer on left lower leg which does not appear clinically infected. Skin is warm and dry without rash. Neurologic: Awake and alert and oriented to person and place but not time, somewhat agitated and tremulous, cranial nerves are intact, there are no motor or sensory deficits.  He is constantly trying to take off his pulse oximeter even though he is repetitively told that it needs to stay on.  ED Treatments /  Results  Labs (all labs ordered are listed, but only abnormal results are displayed) Labs Reviewed  COMPREHENSIVE METABOLIC PANEL - Abnormal; Notable for the following components:      Result Value   Sodium 148 (*)    Potassium 5.2 (*)    Chloride 124 (*)    CO2 14 (*)    Glucose, Bld 216 (*)    BUN 61 (*)    Creatinine, Ser 2.56 (*)    GFR calc non Af Amer 24 (*)    GFR calc Af Amer 28 (*)    All other components within normal limits  CBC - Abnormal; Notable for the following components:   RBC 3.17 (*)    Hemoglobin 9.0 (*)    HCT 31.7 (*)    MCHC 28.4 (*)    All other components within normal limits  URINALYSIS, ROUTINE W REFLEX MICROSCOPIC - Abnormal; Notable for the following components:   Hgb urine dipstick SMALL (*)    Ketones, ur 5 (*)    Protein, ur 30 (*)    All other components within normal limits  CBG MONITORING, ED - Abnormal; Notable for the following components:   Glucose-Capillary 179 (*)    All other components within normal limits  CULTURE, BLOOD (ROUTINE X 2)  CULTURE, BLOOD (ROUTINE X 2)  CBG MONITORING, ED  I-STAT CG4 LACTIC ACID, ED  I-STAT CG4 LACTIC ACID, ED    EKG EKG Interpretation  Date/Time:  Wednesday September 06 2018 01:35:51 EST Ventricular Rate:  84 PR Interval:    QRS Duration: 125 QT Interval:  401 QTC Calculation: 448 R  Axis:   -64 Text Interpretation:  Atrial flutter with varied AV block, Nonspecific IVCD with LAD Nonspecific T abnormalities, lateral leads Minimal ST elevation, anterior leads When compared with ECG of 06/30/2011, Premature ventricular complexes are no longer present Confirmed by Delora Fuel ([id number]) on 09/06/2018 1:40:11 AM   Radiology Dg Chest 2 View  Result Date: 09/06/2018 CLINICAL DATA:  Cough.  Altered mental status. EXAM: CHEST - 2 VIEW COMPARISON:  Chest x-ray dated December 11, 2015. FINDINGS: The heart remains at the upper limits of normal in size. Mild pulmonary vascular congestion. Mild patchy density in the medial right lower lobe. No pleural effusion or pneumothorax. No acute osseous abnormality. IMPRESSION: 1. Right lower lobe atelectasis versus infiltrate. 2. Mild pulmonary vascular congestion. Electronically Signed   By: Titus Dubin M.D.   On: 09/06/2018 00:42   Ct Head Wo Contrast  Result Date: 09/06/2018 CLINICAL DATA:  Altered mental status. EXAM: CT HEAD WITHOUT CONTRAST TECHNIQUE: Contiguous axial images were obtained from the base of the skull through the vertex without intravenous contrast. COMPARISON:  None. FINDINGS: Despite efforts by the technologist and patient, motion artifact is present on today's exam and could not be eliminated. This reduces exam sensitivity and specificity. Brain: No evidence of acute infarction, hemorrhage, hydrocephalus, extra-axial collection or mass lesion/mass effect. Mild to moderate age related cerebral atrophy. Vascular: Calcified atherosclerosis at the skullbase. No hyperdense vessel. Skull: Normal. Negative for fracture or focal lesion. Sinuses/Orbits: No acute finding. Other: None. IMPRESSION: 1. Motion limited study. No definite acute intracranial abnormality. Electronically Signed   By: Titus Dubin M.D.   On: 09/06/2018 01:41    Procedures Procedures   Medications Ordered in ED Medications  cefTRIAXone (ROCEPHIN) 2 g in sodium  chloride 0.9 % 100 mL IVPB (0 g Intravenous Stopped 09/06/18 0214)  azithromycin (ZITHROMAX) 500 mg in sodium chloride 0.9 % 250 mL  IVPB (has no administration in time range)     Initial Impression / Assessment and Plan / ED Course  I have reviewed the triage vital signs and the nursing notes.  Pertinent labs & imaging results that were available during my care of the patient were reviewed by me and considered in my medical decision making (see chart for details).  Altered mental status.  Labs do show increase in his creatinine compared with baseline (2.56 compared with 2.04 in September 2018.  BUN increased relative to creatinine suggesting some degree of dehydration.  Anemia is present and unchanged from baseline.  Old records are reviewed confirming ED visit for laceration left lower leg last month.  Will check urinalysis to look for signs of infection, will check chest x-ray to look for evidence of occult pneumonia.  We will also check CT head.  Anticipate need for hospital admission.  Chest x-ray suggests possible right lower lobe pneumonia.  Urinalysis does not show evidence of infection.  He is started on antibiotics for community-acquired pneumonia.  Because of altered mental status, sepsis screen was initiated.  Lactic acid level is normal.  Also, CT of the head shows no acute process.  Case is discussed with Dr. Tamala Julian of [facility] hospital[facility], who agrees to admit the patient.  Final Clinical Impressions(s) / ED Diagnoses   Final diagnoses:  Altered mental status, unspecified altered mental status type  Community acquired pneumonia of right lower lobe of lung ([place])  Acute kidney injury (nontraumatic) ([place])  Metabolic acidosis  Macrocytic anemia    ED Discharge Orders    None       Delora Fuel, MD 34/91/79 [phone]

## 2018-09-06 NOTE — ED Notes (Signed)
While attempting to obtain vitals it was noted that pt has pulled off his gown and most of his ecg leads for cardiac monitoring. Pt also was witnessed pulling at the tape that has his IV secure.

## 2018-09-07 ENCOUNTER — Observation Stay (HOSPITAL_COMMUNITY): Payer: Medicare Other

## 2018-09-07 ENCOUNTER — Encounter (HOSPITAL_COMMUNITY): Payer: Self-pay | Admitting: Nephrology

## 2018-09-07 DIAGNOSIS — N179 Acute kidney failure, unspecified: Secondary | ICD-10-CM | POA: Diagnosis present

## 2018-09-07 DIAGNOSIS — Z7982 Long term (current) use of aspirin: Secondary | ICD-10-CM | POA: Diagnosis not present

## 2018-09-07 DIAGNOSIS — I5032 Chronic diastolic (congestive) heart failure: Secondary | ICD-10-CM | POA: Diagnosis present

## 2018-09-07 DIAGNOSIS — E1142 Type 2 diabetes mellitus with diabetic polyneuropathy: Secondary | ICD-10-CM | POA: Diagnosis present

## 2018-09-07 DIAGNOSIS — Z888 Allergy status to other drugs, medicaments and biological substances status: Secondary | ICD-10-CM | POA: Diagnosis not present

## 2018-09-07 DIAGNOSIS — E1122 Type 2 diabetes mellitus with diabetic chronic kidney disease: Secondary | ICD-10-CM | POA: Diagnosis not present

## 2018-09-07 DIAGNOSIS — E872 Acidosis: Secondary | ICD-10-CM | POA: Diagnosis present

## 2018-09-07 DIAGNOSIS — Z8249 Family history of ischemic heart disease and other diseases of the circulatory system: Secondary | ICD-10-CM | POA: Diagnosis not present

## 2018-09-07 DIAGNOSIS — E860 Dehydration: Secondary | ICD-10-CM | POA: Diagnosis present

## 2018-09-07 DIAGNOSIS — M109 Gout, unspecified: Secondary | ICD-10-CM | POA: Diagnosis present

## 2018-09-07 DIAGNOSIS — M79641 Pain in right hand: Secondary | ICD-10-CM | POA: Diagnosis not present

## 2018-09-07 DIAGNOSIS — Z940 Kidney transplant status: Secondary | ICD-10-CM | POA: Diagnosis not present

## 2018-09-07 DIAGNOSIS — D631 Anemia in chronic kidney disease: Secondary | ICD-10-CM | POA: Diagnosis present

## 2018-09-07 DIAGNOSIS — N189 Chronic kidney disease, unspecified: Secondary | ICD-10-CM | POA: Diagnosis not present

## 2018-09-07 DIAGNOSIS — R4182 Altered mental status, unspecified: Secondary | ICD-10-CM | POA: Diagnosis not present

## 2018-09-07 DIAGNOSIS — I130 Hypertensive heart and chronic kidney disease with heart failure and stage 1 through stage 4 chronic kidney disease, or unspecified chronic kidney disease: Secondary | ICD-10-CM | POA: Diagnosis present

## 2018-09-07 DIAGNOSIS — T8619 Other complication of kidney transplant: Secondary | ICD-10-CM | POA: Diagnosis present

## 2018-09-07 DIAGNOSIS — N183 Chronic kidney disease, stage 3 (moderate): Secondary | ICD-10-CM | POA: Diagnosis present

## 2018-09-07 DIAGNOSIS — G9341 Metabolic encephalopathy: Secondary | ICD-10-CM | POA: Diagnosis not present

## 2018-09-07 DIAGNOSIS — E870 Hyperosmolality and hypernatremia: Secondary | ICD-10-CM | POA: Diagnosis not present

## 2018-09-07 DIAGNOSIS — J189 Pneumonia, unspecified organism: Secondary | ICD-10-CM | POA: Diagnosis present

## 2018-09-07 DIAGNOSIS — A419 Sepsis, unspecified organism: Secondary | ICD-10-CM | POA: Diagnosis present

## 2018-09-07 DIAGNOSIS — Y830 Surgical operation with transplant of whole organ as the cause of abnormal reaction of the patient, or of later complication, without mention of misadventure at the time of the procedure: Secondary | ICD-10-CM | POA: Diagnosis present

## 2018-09-07 DIAGNOSIS — I480 Paroxysmal atrial fibrillation: Secondary | ICD-10-CM | POA: Diagnosis present

## 2018-09-07 DIAGNOSIS — D696 Thrombocytopenia, unspecified: Secondary | ICD-10-CM | POA: Diagnosis present

## 2018-09-07 DIAGNOSIS — E785 Hyperlipidemia, unspecified: Secondary | ICD-10-CM | POA: Diagnosis present

## 2018-09-07 DIAGNOSIS — Z794 Long term (current) use of insulin: Secondary | ICD-10-CM | POA: Diagnosis not present

## 2018-09-07 DIAGNOSIS — I4892 Unspecified atrial flutter: Secondary | ICD-10-CM | POA: Diagnosis present

## 2018-09-07 DIAGNOSIS — E1165 Type 2 diabetes mellitus with hyperglycemia: Secondary | ICD-10-CM | POA: Diagnosis present

## 2018-09-07 LAB — CBC
HCT: 30.7 % — ABNORMAL LOW (ref 39.0–52.0)
Hemoglobin: 8.9 g/dL — ABNORMAL LOW (ref 13.0–17.0)
MCH: 29.3 pg (ref 26.0–34.0)
MCHC: 29 g/dL — ABNORMAL LOW (ref 30.0–36.0)
MCV: 101 fL — ABNORMAL HIGH (ref 80.0–100.0)
Platelets: 133 10*3/uL — ABNORMAL LOW (ref 150–400)
RBC: 3.04 MIL/uL — ABNORMAL LOW (ref 4.22–5.81)
RDW: 13.7 % (ref 11.5–15.5)
WBC: 11.4 10*3/uL — ABNORMAL HIGH (ref 4.0–10.5)
nRBC: 0 % (ref 0.0–0.2)

## 2018-09-07 LAB — GLUCOSE, CAPILLARY
GLUCOSE-CAPILLARY: 169 mg/dL — AB (ref 70–99)
Glucose-Capillary: 301 mg/dL — ABNORMAL HIGH (ref 70–99)
Glucose-Capillary: 257 mg/dL — ABNORMAL HIGH (ref 70–99)
Glucose-Capillary: 290 mg/dL — ABNORMAL HIGH (ref 70–99)

## 2018-09-07 LAB — INFLUENZA PANEL BY PCR (TYPE A & B)
INFLAPCR: NEGATIVE
INFLBPCR: NEGATIVE

## 2018-09-07 LAB — URIC ACID: Uric Acid, Serum: 10.5 mg/dL — ABNORMAL HIGH (ref 3.7–8.6)

## 2018-09-07 LAB — BASIC METABOLIC PANEL
Anion gap: 13 (ref 5–15)
BUN: 38 mg/dL — ABNORMAL HIGH (ref 8–23)
CO2: 10 mmol/L — ABNORMAL LOW (ref 22–32)
Calcium: 9.1 mg/dL (ref 8.9–10.3)
Chloride: 124 mmol/L — ABNORMAL HIGH (ref 98–111)
Creatinine, Ser: 2.28 mg/dL — ABNORMAL HIGH (ref 0.61–1.24)
GFR calc non Af Amer: 28 mL/min — ABNORMAL LOW (ref >60)
GFR, EST AFRICAN AMERICAN: 32 mL/min — AB (ref >60)
Glucose, Bld: 213 mg/dL — ABNORMAL HIGH (ref 70–99)
Potassium: 4.6 mmol/L (ref 3.5–5.1)
Sodium: 147 mmol/L — ABNORMAL HIGH (ref 135–145)

## 2018-09-07 LAB — MRSA PCR SCREENING: MRSA by PCR: NEGATIVE

## 2018-09-07 LAB — TACROLIMUS LEVEL: Tacrolimus (FK506) - LabCorp: 5.4 ng/mL (ref 2.0–20.0)

## 2018-09-07 MED ORDER — COLCHICINE 0.6 MG PO TABS
0.6000 mg | ORAL_TABLET | Freq: Once | ORAL | Status: AC
  Administered 2018-09-07: 0.6 mg via ORAL
  Filled 2018-09-07: qty 1

## 2018-09-07 MED ORDER — VANCOMYCIN HCL 10 G IV SOLR
1500.0000 mg | Freq: Once | INTRAVENOUS | Status: AC
  Administered 2018-09-07: 1500 mg via INTRAVENOUS
  Filled 2018-09-07: qty 1500

## 2018-09-07 MED ORDER — TRAZODONE HCL 50 MG PO TABS
25.0000 mg | ORAL_TABLET | Freq: Every evening | ORAL | Status: DC | PRN
  Administered 2018-09-09 – 2018-09-11 (×2): 25 mg via ORAL
  Filled 2018-09-07 (×2): qty 1

## 2018-09-07 MED ORDER — PREDNISONE 20 MG PO TABS
40.0000 mg | ORAL_TABLET | Freq: Once | ORAL | Status: AC
  Administered 2018-09-07: 40 mg via ORAL
  Filled 2018-09-07: qty 2

## 2018-09-07 MED ORDER — STERILE WATER FOR INJECTION IV SOLN
INTRAVENOUS | Status: DC
  Administered 2018-09-07: 09:00:00 via INTRAVENOUS
  Filled 2018-09-07: qty 850

## 2018-09-07 MED ORDER — MORPHINE SULFATE (PF) 2 MG/ML IV SOLN
0.5000 mg | INTRAVENOUS | Status: DC | PRN
  Administered 2018-09-07: 0.5 mg via INTRAVENOUS
  Filled 2018-09-07 (×2): qty 1

## 2018-09-07 MED ORDER — ACETAMINOPHEN 325 MG PO TABS
650.0000 mg | ORAL_TABLET | Freq: Four times a day (QID) | ORAL | Status: DC | PRN
  Administered 2018-09-07 – 2018-09-08 (×3): 650 mg via ORAL
  Filled 2018-09-07 (×3): qty 2

## 2018-09-07 MED ORDER — INSULIN ASPART 100 UNIT/ML ~~LOC~~ SOLN
0.0000 [IU] | Freq: Three times a day (TID) | SUBCUTANEOUS | Status: DC
  Administered 2018-09-07: 8 [IU] via SUBCUTANEOUS
  Administered 2018-09-08: 15 [IU] via SUBCUTANEOUS
  Administered 2018-09-08: 11 [IU] via SUBCUTANEOUS
  Administered 2018-09-08: 15 [IU] via SUBCUTANEOUS
  Administered 2018-09-09: 5 [IU] via SUBCUTANEOUS
  Administered 2018-09-09: 11 [IU] via SUBCUTANEOUS
  Administered 2018-09-09: 5 [IU] via SUBCUTANEOUS

## 2018-09-07 MED ORDER — DEXTROSE 5 % IV SOLN
INTRAVENOUS | Status: DC
  Administered 2018-09-07 – 2018-09-08 (×2): via INTRAVENOUS

## 2018-09-07 MED ORDER — SODIUM CHLORIDE 0.9 % IV BOLUS
500.0000 mL | Freq: Once | INTRAVENOUS | Status: AC
  Administered 2018-09-07: 500 mL via INTRAVENOUS

## 2018-09-07 MED ORDER — ALLOPURINOL 100 MG PO TABS
100.0000 mg | ORAL_TABLET | Freq: Every day | ORAL | Status: DC
  Administered 2018-09-07 – 2018-09-11 (×5): 100 mg via ORAL
  Filled 2018-09-07 (×5): qty 1

## 2018-09-07 MED ORDER — INSULIN GLARGINE 100 UNIT/ML ~~LOC~~ SOLN
10.0000 [IU] | Freq: Every day | SUBCUTANEOUS | Status: DC
  Administered 2018-09-07 – 2018-09-08 (×2): 10 [IU] via SUBCUTANEOUS
  Filled 2018-09-07 (×2): qty 0.1

## 2018-09-07 MED ORDER — VANCOMYCIN HCL 10 G IV SOLR: 1500.0000 mg | INTRAVENOUS | Status: DC

## 2018-09-07 MED ORDER — STERILE WATER FOR INJECTION IV SOLN
INTRAVENOUS | Status: DC
  Administered 2018-09-07 – 2018-09-09 (×4): via INTRAVENOUS
  Filled 2018-09-07 (×4): qty 850

## 2018-09-07 MED ORDER — SODIUM CHLORIDE 0.9 % IV SOLN
2.0000 g | Freq: Two times a day (BID) | INTRAVENOUS | Status: DC
  Administered 2018-09-07 – 2018-09-10 (×7): 2 g via INTRAVENOUS
  Filled 2018-09-07 (×8): qty 2

## 2018-09-07 NOTE — Progress Notes (Signed)
Influenza swab neg and MRSA PCR negative, droplet precaution discontinued. SRP,RN

## 2018-09-07 NOTE — Progress Notes (Signed)
Inpatient Diabetes Program Recommendations  AACE/ADA: New Consensus Statement on Inpatient Glycemic Control (2015)  Target Ranges:  Prepandial:   less than 140 mg/dL      Peak postprandial:   less than 180 mg/dL (1-2 hours)      Critically ill patients:  140 - 180 mg/dL   Lab Results  Component Value Date   GLUCAP 301 (H) 09/07/2018   HGBA1C 8.8 (H) 07/01/2011    Review of Glycemic Control  Diabetes history: DM2 Outpatient Diabetes medications: Humalog 3-5 units tidwc, Lantus 7 units QD if < 300, 15 units QD if > 300 mg/dL. Current orders for Inpatient glycemic control: Novolog 0-15 units tidwc  Inpatient Diabetes Program Recommendations:     Add Lantus 10 units QHS  Will follow trends.   Thank you. Lorenda Peck, RD, LDN, CDE Inpatient Diabetes Coordinator [phone]

## 2018-09-07 NOTE — Progress Notes (Addendum)
Pharmacy Antibiotic Note  [name] is a 72 y.o. male s/p renal transplant 2005 admitted on 09/05/2018 with pneumonia.  Pharmacy has been consulted for Cefepime dosing.  Tmax 102.7 WBC 11.4 (chronic prednisone) SCr 2.28, CrCl ~30 ml/min PCT 0.15  Plan: Cefepime 2g IV q12h Follow up renal function & cultures  Height: 5\' 9"  (175.3 cm) Weight: 176 lb 2.4 oz (79.9 kg) IBW/kg (Calculated) : 70.7  Temp (24hrs), Avg:100.4 F (38 C), Min:97.7 F (36.5 C), Max:102.7 F (39.3 C)  Recent Labs  Lab 09/05/18 2211 09/06/18 0132 09/06/18 0356 09/06/18 0357 09/06/18 0406 09/07/18 0819  WBC 5.7  --   --  6.6  --  11.4*  CREATININE 2.56*  --  2.42*  --   --  2.28*  LATICACIDVEN  --  1.07  --   --  1.29  --     Estimated Creatinine Clearance: 29.7 mL/min (A) (by C-G formula based on SCr of 2.28 mg/dL (H)).    Allergies  Allergen Reactions  . Elavil [Amitriptyline Hcl] Other (See Comments)    Unknown felt as if he was tripping    Antimicrobials this admission:  12/18 Rocephin >> 12/19 12/18 Azithro >> 12/19 Cefepime >>  Microbiology results:  12/18 BCx: ngtd 12/18 Urine strep Ag: neg 12/18 Urine legionella Ag: IP 12/19 MRSA PCR: 12/19 Influenza panel:  Thank you for allowing pharmacy to be a part of this patient's care.  Peggyann [name], PharmD, BCPS Pager: [phone] 09/07/2018 8:50 AM   Addendum: Add vancomycin - 1500mg  IV q48h for estimated AUC 496 Follow renal function closely and adjust dose as needed Check levels as indicated  Peggyann [name], PharmD, BCPS 09/07/2018 11:50 AM

## 2018-09-07 NOTE — Progress Notes (Signed)
Pt axillary temp 102.7. On call notified. Order for 500 cc bolus carried out. This RN also placed ice pack on groin and a cold rag on forehead. Will recheck temp and continue to monitor the pt closely.

## 2018-09-07 NOTE — Consult Note (Signed)
Reason for Consult:R Hand Pain Referring Physician: Hospitalist  CC:pt unable to answer specific questions  HPI:  [name] is an 72 y.o. right handed male who presents with altered mental status, found to have swollen Right hand, pt shows some signs of renal failure, ? sepsis .  Per pt's wife pt does have a history of gout.   Associated signs/symptoms:  ? Pneumonia, renal failure, s/p kidney transplant Previous treatment:  Yrs ago trauma to R hand, old fistula R hand  Past Medical History:  Diagnosis Date  . Anemia   . Atrial fibrillation ([place])   . Chronic kidney disease   . Diabetes mellitus   . Dysrhythmia   . Hyperlipidemia   . Neuropathy     Past Surgical History:  Procedure Laterality Date  . access proceds    . achlles tendon repair    . ARTERIOVENOUS GRAFT PLACEMENT    . bowel repla     repair  . [place] and 2005  . LIGATION GORETEX GRAFT  06/30/11   Right upper arm    Family History  Problem Relation Age of Onset  . Heart disease Mother   . Heart attack Father   . Stroke Sister     Social History:  reports that he has never smoked. He has never used smokeless tobacco. He reports that he does not drink alcohol or use drugs.  Allergies:  Allergies  Allergen Reactions  . Elavil [Amitriptyline Hcl] Other (See Comments)    Unknown felt as if he was tripping    Medications: I have reviewed the patient's current medications.  Results for orders placed or performed during the hospital encounter of 09/05/18 (from the past 48 hour(s))  CBG monitoring, ED     Status: Abnormal   Collection Time: 09/05/18 10:03 PM  Result Value Ref Range   Glucose-Capillary 179 (H) 70 - 99 mg/dL  Comprehensive metabolic panel     Status: Abnormal   Collection Time: 09/05/18 10:11 PM  Result Value Ref Range   Sodium 148 (H) 135 - 145 mmol/L   Potassium 5.2 (H) 3.5 - 5.1 mmol/L   Chloride 124 (H) 98 - 111 mmol/L   CO2 14 (L) 22 - 32 mmol/L   Glucose,  Bld 216 (H) 70 - 99 mg/dL   BUN 61 (H) 8 - 23 mg/dL   Creatinine, Ser 2.56 (H) 0.61 - 1.24 mg/dL   Calcium 10.0 8.9 - 10.3 mg/dL   Total Protein 6.7 6.5 - 8.1 g/dL   Albumin 3.7 3.5 - 5.0 g/dL   AST 30 15 - 41 U/L   ALT 13 0 - 44 U/L   Alkaline Phosphatase 89 38 - 126 U/L   Total Bilirubin 0.7 0.3 - 1.2 mg/dL   GFR calc non Af Amer 24 (L) >60 mL/min   GFR calc Af Amer 28 (L) >60 mL/min   Anion gap 10 5 - 15    Comment: Performed at [facility], [place] [address] [place], [place] [address]  CBC     Status: Abnormal   Collection Time: 09/05/18 10:11 PM  Result Value Ref Range   WBC 5.7 4.0 - 10.5 K/uL   RBC 3.17 (L) 4.22 - 5.81 MIL/uL   Hemoglobin 9.0 (L) 13.0 - 17.0 g/dL   HCT 31.7 (L) 39.0 - 52.0 %   MCV 100.0 80.0 - 100.0 fL   MCH 28.4 26.0 - 34.0 pg   MCHC 28.4 (L) 30.0 -  36.0 g/dL   RDW 13.6 11.5 - 15.5 %   Platelets 172 150 - 400 K/uL   nRBC 0.0 0.0 - 0.2 %    Comment: Performed at [facility], [place] [address] [place], [place] [address]  Urinalysis, Routine w reflex microscopic     Status: Abnormal   Collection Time: 09/06/18 12:16 AM  Result Value Ref Range   Color, Urine YELLOW YELLOW   APPearance CLEAR CLEAR   Specific Gravity, Urine 1.011 1.005 - 1.030   pH 5.0 5.0 - 8.0   Glucose, UA NEGATIVE NEGATIVE mg/dL   Hgb urine dipstick SMALL (A) NEGATIVE   Bilirubin Urine NEGATIVE NEGATIVE   Ketones, ur 5 (A) NEGATIVE mg/dL   Protein, ur 30 (A) NEGATIVE mg/dL   Nitrite NEGATIVE NEGATIVE   Leukocytes, UA NEGATIVE NEGATIVE   RBC / HPF 0-5 0 - 5 RBC/hpf   WBC, UA 0-5 0 - 5 WBC/hpf   Bacteria, UA NONE SEEN NONE SEEN   Mucus PRESENT     Comment: Performed at [facility], [place] [address], [place], [place] [address]  Blood Culture (routine x 2)     Status: None (Preliminary result)   Collection Time: 09/06/18  1:22 AM  Result Value Ref Range   Specimen Description      BLOOD LEFT FOREARM Performed at [facility], [place] [address], [place], [place] [address]    Special Requests      BOTTLES DRAWN AEROBIC AND ANAEROBIC Blood Culture adequate volume Performed at [place] [address] [place], [place] [address]    Culture      NO GROWTH 1 DAY Performed at [place] [address] [place], [place] [address]    Report Status PENDING   Blood Culture (routine x 2)     Status: None (Preliminary result)   Collection Time: 09/06/18  1:22 AM  Result Value Ref Range   Specimen Description      BLOOD RIGHT ANTECUBITAL Performed at [place] [address], [place], [place] [address]    Special Requests      AEROBIC BOTTLE ONLY Blood Culture adequate volume Performed at [place] [address], [place], [place] [address]    Culture      NO GROWTH 1 DAY Performed at [place] [address] [place],  [address]    Report Status PENDING   I-Stat CG4 Lactic Acid, ED     Status: None   Collection Time: 09/06/18  1:32 AM  Result Value Ref Range   Lactic Acid, Venous 1.07 0.5 - 1.9 mmol/L  Urine rapid drug screen (hosp performed)     Status: Abnormal   Collection Time: 09/06/18  3:45 AM  Result Value Ref Range   Opiates POSITIVE (A) NONE DETECTED   Cocaine NONE DETECTED NONE DETECTED   Benzodiazepines NONE DETECTED NONE DETECTED   Amphetamines NONE DETECTED NONE DETECTED   Tetrahydrocannabinol NONE DETECTED NONE DETECTED   Barbiturates NONE DETECTED NONE DETECTED    Comment: (NOTE) DRUG SCREEN FOR MEDICAL PURPOSES ONLY.  IF CONFIRMATION IS NEEDED FOR ANY PURPOSE, NOTIFY LAB WITHIN 5 DAYS. LOWEST DETECTABLE LIMITS FOR URINE DRUG SCREEN Drug Class                     Cutoff (ng/mL) Amphetamine and metabolites    1000 Barbiturate and metabolites    200 Benzodiazepine  696 Tricyclics and metabolites     300 Opiates and metabolites        300 Cocaine and metabolites         300 THC                            50 Performed at [facility], [place] [address], [place], [place] [address]   Strep pneumoniae urinary antigen     Status: None   Collection Time: 09/06/18  3:45 AM  Result Value Ref Range   Strep Pneumo Urinary Antigen NEGATIVE NEGATIVE    Comment:        Infection due to S. pneumoniae cannot be absolutely ruled out since the antigen present may be below the detection limit of the test. Performed at [facility], [address], [place], [place] [address]   Renal function panel     Status: Abnormal   Collection Time: 09/06/18  3:56 AM  Result Value Ref Range   Sodium 148 (H) 135 - 145 mmol/L   Potassium 5.2 (H) 3.5 - 5.1 mmol/L   Chloride 127 (H) 98 - 111 mmol/L   CO2 11 (L) 22 - 32 mmol/L   Glucose, Bld 219 (H) 70 - 99 mg/dL   BUN 56 (H) 8 - 23 mg/dL   Creatinine, Ser 2.42 (H) 0.61 - 1.24 mg/dL   Calcium 9.5 8.9 - 10.3 mg/dL   Phosphorus 3.1 2.5 - 4.6 mg/dL   Albumin 3.5 3.5 - 5.0 g/dL   GFR calc non Af Amer 26 (L) >60 mL/min   GFR calc Af Amer 30 (L) >60 mL/min   Anion gap 10 5 - 15    Comment: Performed at [facility], [place] [address], [place], [place] [address]  TSH     Status: None   Collection Time: 09/06/18  3:56 AM  Result Value Ref Range   TSH 2.422 0.350 - 4.500 uIU/mL    Comment: Performed by a 3rd Generation assay with a functional sensitivity of <=0.01 uIU/mL. Performed at [facility], [place] [address] [place], [place] [address]   Procalcitonin - Baseline     Status: None   Collection Time: 09/06/18  3:56 AM  Result Value Ref Range   Procalcitonin 0.15 ng/mL    Comment:        Interpretation: PCT (Procalcitonin) <= 0.5 ng/mL: Systemic infection (sepsis) is not likely. Local bacterial infection is possible. (NOTE)       Sepsis PCT Algorithm           Lower Respiratory Tract                                      Infection PCT Algorithm     ----------------------------     ----------------------------         PCT < 0.25 ng/mL                PCT < 0.10 ng/mL         Strongly encourage             Strongly discourage   discontinuation of antibiotics    initiation of antibiotics    ----------------------------     -----------------------------       PCT 0.25 - 0.50 ng/mL            PCT  0.10 - 0.25 ng/mL               OR       >80% decrease in PCT            Discourage initiation of                                            antibiotics      Encourage discontinuation           of antibiotics    ----------------------------     -----------------------------         PCT >= 0.50 ng/mL              PCT 0.26 - 0.50 ng/mL               AND        <80% decrease in PCT             Encourage initiation of                                             antibiotics       Encourage continuation           of antibiotics    ----------------------------     -----------------------------        PCT >= 0.50 ng/mL                  PCT > 0.50 ng/mL               AND         increase in PCT                  Strongly encourage                                      initiation of antibiotics    Strongly encourage escalation           of antibiotics                                     -----------------------------                                           PCT <= 0.25 ng/mL                                                 OR                                        > 80% decrease in PCT  Discontinue / Do not initiate                                             antibiotics Performed at [place] [address], [place], [place] [address]   CBC     Status: Abnormal   Collection Time: 09/06/18  3:57 AM  Result Value Ref Range   WBC 6.6 4.0 - 10.5 K/uL   RBC 2.89 (L) 4.22 - 5.81 MIL/uL   Hemoglobin 8.5 (L) 13.0 - 17.0 g/dL   HCT 30.6 (L) 39.0 - 52.0 %   MCV 105.9 (H) 80.0 - 100.0 fL   MCH  29.4 26.0 - 34.0 pg   MCHC 27.8 (L) 30.0 - 36.0 g/dL   RDW 13.7 11.5 - 15.5 %   Platelets 140 (L) 150 - 400 K/uL   nRBC 0.0 0.0 - 0.2 %    Comment: Performed at [facility], [place] [address] [place], [place] [address]  I-Stat CG4 Lactic Acid, ED     Status: None   Collection Time: 09/06/18  4:06 AM  Result Value Ref Range   Lactic Acid, Venous 1.29 0.5 - 1.9 mmol/L  CBG monitoring, ED     Status: Abnormal   Collection Time: 09/06/18  8:03 AM  Result Value Ref Range   Glucose-Capillary 193 (H) 70 - 99 mg/dL  CBG monitoring, ED     Status: Abnormal   Collection Time: 09/06/18  2:34 PM  Result Value Ref Range   Glucose-Capillary 193 (H) 70 - 99 mg/dL  Glucose, capillary     Status: Abnormal   Collection Time: 09/06/18  5:28 PM  Result Value Ref Range   Glucose-Capillary 180 (H) 70 - 99 mg/dL  Glucose, capillary     Status: Abnormal   Collection Time: 09/06/18 10:46 PM  Result Value Ref Range   Glucose-Capillary 232 (H) 70 - 99 mg/dL  Glucose, capillary     Status: Abnormal   Collection Time: 09/07/18  7:49 AM  Result Value Ref Range   Glucose-Capillary 169 (H) 70 - 99 mg/dL  CBC     Status: Abnormal   Collection Time: 09/07/18  8:19 AM  Result Value Ref Range   WBC 11.4 (H) 4.0 - 10.5 K/uL   RBC 3.04 (L) 4.22 - 5.81 MIL/uL   Hemoglobin 8.9 (L) 13.0 - 17.0 g/dL   HCT 30.7 (L) 39.0 - 52.0 %   MCV 101.0 (H) 80.0 - 100.0 fL   MCH 29.3 26.0 - 34.0 pg   MCHC 29.0 (L) 30.0 - 36.0 g/dL   RDW 13.7 11.5 - 15.5 %   Platelets 133 (L) 150 - 400 K/uL   nRBC 0.0 0.0 - 0.2 %    Comment: Performed at [facility], [place] [address], [place], [place] [address]  Basic metabolic panel     Status: Abnormal   Collection Time: 09/07/18  8:19 AM  Result Value Ref Range   Sodium 147 (H) 135 - 145 mmol/L   Potassium 4.6 3.5 - 5.1 mmol/L   Chloride 124 (H) 98 - 111 mmol/L   CO2 10 (L) 22 - 32 mmol/L   Glucose, Bld 213 (H) 70 - 99 mg/dL   BUN 38 (H) 8 - 23  mg/dL   Creatinine, Ser 2.28 (H) 0.61 - 1.24 mg/dL   Calcium 9.1 8.9 - 10.3  mg/dL   GFR calc non Af Amer 28 (L) >60 mL/min   GFR calc Af Amer 32 (L) >60 mL/min   Anion gap 13 5 - 15    Comment: Performed at [facility], [place] [address] [place], [place] [address]  Uric acid     Status: Abnormal   Collection Time: 09/07/18  8:19 AM  Result Value Ref Range   Uric Acid, Serum 10.5 (H) 3.7 - 8.6 mg/dL    Comment: Performed at [facility], [place] [address], [place], [place] [address]  Influenza panel by PCR (type A & B)     Status: None   Collection Time: 09/07/18  8:35 AM  Result Value Ref Range   Influenza A By PCR NEGATIVE NEGATIVE   Influenza B By PCR NEGATIVE NEGATIVE    Comment: (NOTE) The Xpert Xpress Flu assay is intended as an aid in the diagnosis of  influenza and should not be used as a sole basis for treatment.  This  assay is FDA approved for nasopharyngeal swab specimens only. Nasal  washings and aspirates are unacceptable for Xpert Xpress Flu testing. Performed at [facility], [place] [address], [place], [place] [address]   MRSA PCR Screening     Status: None   Collection Time: 09/07/18  8:45 AM  Result Value Ref Range   MRSA by PCR NEGATIVE NEGATIVE    Comment:        The GeneXpert MRSA Assay (FDA approved for NASAL specimens only), is one component of a comprehensive MRSA colonization surveillance program. It is not intended to diagnose MRSA infection nor to guide or monitor treatment for MRSA infections. Performed at [facility], [place] [address], [place], [place] [address]   Glucose, capillary     Status: Abnormal   Collection Time: 09/07/18 11:56 AM  Result Value Ref Range   Glucose-Capillary 301 (H) 70 - 99 mg/dL  Glucose, capillary     Status: Abnormal   Collection Time: 09/07/18  5:14 PM  Result Value Ref Range   Glucose-Capillary 257 (H) 70 - 99 mg/dL    Dg Chest 2  View  Result Date: 09/06/2018 CLINICAL DATA:  Cough.  Altered mental status. EXAM: CHEST - 2 VIEW COMPARISON:  Chest x-ray dated December 11, 2015. FINDINGS: The heart remains at the upper limits of normal in size. Mild pulmonary vascular congestion. Mild patchy density in the medial right lower lobe. No pleural effusion or pneumothorax. No acute osseous abnormality. IMPRESSION: 1. Right lower lobe atelectasis versus infiltrate. 2. Mild pulmonary vascular congestion. Electronically Signed   By: Titus Dubin M.D.   On: 09/06/2018 00:42   Ct Head Wo Contrast  Result Date: 09/06/2018 CLINICAL DATA:  Altered mental status. EXAM: CT HEAD WITHOUT CONTRAST TECHNIQUE: Contiguous axial images were obtained from the base of the skull through the vertex without intravenous contrast. COMPARISON:  None. FINDINGS: Despite efforts by the technologist and patient, motion artifact is present on today's exam and could not be eliminated. This reduces exam sensitivity and specificity. Brain: No evidence of acute infarction, hemorrhage, hydrocephalus, extra-axial collection or mass lesion/mass effect. Mild to moderate age related cerebral atrophy. Vascular: Calcified atherosclerosis at the skullbase. No hyperdense vessel. Skull: Normal. Negative for fracture or focal lesion. Sinuses/Orbits: No acute finding. Other: None. IMPRESSION: 1. Motion limited study. No definite acute intracranial abnormality. Electronically Signed   By: Titus Dubin M.D.   On: 09/06/2018 01:41   Dg Hand 2 View  Right  Result Date: 09/07/2018 CLINICAL DATA:  Hand pain.  Possible gout EXAM: RIGHT HAND - 2 VIEW COMPARISON:  04/18/2017 FINDINGS: Limited study. Contractures of the fingers. Motion degraded image on the lateral view. Negative for fracture. Cartilage loss and erosions of the first MCP joint unchanged. Cartilage loss and irregularity of the third MCP joint. These are stable from the prior study. Extensive arterial calcification IMPRESSION:  Cartilage loss and subchondral irregularity of the second and third MCP joints unchanged. This is not a typical location for gout. 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 Electronically Signed   By: Franchot Gallo M.D.   On: 09/07/2018 13:46    Pertinent items are noted in HPI. Temp:  [97.7 F (36.5 C)-102.7 F (39.3 C)] 98.1 F (36.7 C) (12/19 1522) Pulse Rate:  [69-111] 69 (12/19 1522) Resp:  [16-20] 20 (12/19 1522) BP: (127-160)/(60-96) 127/65 (12/19 1522) SpO2:  [95 %-97 %] 96 % (12/19 1522) Weight:  [79.9 kg] 79.9 kg (12/19 0528) General appearance: alert and slowed mentation Resp: no resp distress Extremities: R Hand very apinful to move wrist , fingers, some swelling mostly dorsal hand, hand skin discolored, but don't appreciate any erythema, diffuse tenderness of wrist and all fingers ; Bilateral knees with moderate pain with palpation, bilateral ankles with moderate pain with motion. Left Hand wnl  Assessment: Most likely Gout as multiple joints involved, uric acid elevated Plan: No surgical intervention needed at this time, would treat as inflammatory/gout; elevate Hand.  Please call me if signs/symptoms change. Thank you.  I have discussed this treatment plan in detail with patient and  family.     Dennie Bible 09/07/2018, 6:41 PM

## 2018-09-07 NOTE — Progress Notes (Signed)
PROGRESS NOTE    [name]  UTM:[id number] DOB: [birth year] DOA: 09/05/2018 PCP: [name], MD    Brief Narrative: 72 year old with past medical history significant for paroxysmal A. fib, diabetes type 2, status post renal transplant 2005, hypertension who presents with altered mental status.  Wife provide history, she relate that patient has been having a cough for the last week, accompanied with fever of 102.  Evaluation in the emergency department patient was found to have acute on chronic renal failure metabolic acidosis, febrile.  Chest x-ray showing right lower lobe infiltrate versus atelectasis.  Assessment & Plan:   Principal Problem:   Acute metabolic encephalopathy Active Problems:   Kidney transplant status, cadaveric   Immunosuppressed status ([place])   Anemia due to chronic kidney disease   Diabetes mellitus type 2, insulin dependent ([place])   Community acquired pneumonia   1-Acute metabolic encephalopathy; Patient is still confused, more alert. Suspect related to infection and metabolic abnormality. Continue with IV fluids.  2-Sepsis; could be related to pneumonia versus hand infection.. We will broaden IV antibiotics to cefepime and vancomycin.  Patient on immunosuppressive medications.  Continue with IV fluids.   3-Metabolic acidosis, bicarb at 10.  Started IV bicarb gtt.    4-AKI on chronic Kidney diseases III  History of renal transplant. Cr six month ago 2.0.  Tacrolimus level pending.  Nephrology consulted  5-Right had swelling, pain, infection ?  Ortho consulted.  Check x ray.  Check uric acid. Start allopurinol , will give one dose of colchicine.   A flutter; continue with carvedilol.   DM type 2;  SSI.   HTN; hold hydralazine.   Thrombocytopenia; follow trend.  Anemia; likely of chronic diseases, follow trend.   RN Pressure Injury Documentation:    Malnutrition Type:      Malnutrition Characteristics:       Nutrition Interventions:     Estimated body mass index is 26.01 kg/m as calculated from the following:   Height as of this encounter: 5\' 9"  (1.753 m).   Weight as of this encounter: 79.9 kg.   DVT prophylaxis: Heparin  Code Status: full code.  Family Communication: wife at bedside.  Disposition Plan: remain in the hospital for IV fluids, antibiotics.   Consultants:   Nephrology  Hand surgeon.    Procedures:   none   Antimicrobials:  Vancomycin  cefepime   Subjective: Alert , confuse. Repeat words often   Objective: Vitals:   09/06/18 2301 09/07/18 0528 09/07/18 0538 09/07/18 0748  BP: (!) 160/96  136/60   Pulse: (!) 111  (!) 108   Resp: 16  18   Temp: 97.7 F (36.5 C)  (!) 102.7 F (39.3 C) (!) 101.8 F (38.8 C)  TempSrc: Oral  Axillary Oral  SpO2: 95%  97%   Weight:  79.9 kg    Height:        Intake/Output Summary (Last 24 hours) at 09/07/2018 0852 Last data filed at 09/07/2018 5681 Gross per 24 hour  Intake 4717.62 ml  Output 1525 ml  Net 3192.62 ml   Filed Weights   09/05/18 2203 09/06/18 1623 09/07/18 0528  Weight: 77.1 kg 78.1 kg 79.9 kg    Examination:  General exam: alert, ill appearing.  Respiratory system: cta Cardiovascular system: S1 & S2 heard, RRR. No JVD, murmurs, rubs, gallops or clicks. No pedal edema. Gastrointestinal system: Abdomen is nondistended, soft and nontender. No organomegaly or masses felt. Normal bowel sounds heard. Central nervous system: Alert and oriented. No  focal neurological deficits. Extremities: no edema Skin:  LE with open wound healing. Right had with edema, redness,      Data Reviewed: I have personally reviewed following labs and imaging studies  CBC: Recent Labs  Lab 09/05/18 2211 09/06/18 0357 09/07/18 0819  WBC 5.7 6.6 11.4*  HGB 9.0* 8.5* 8.9*  HCT 31.7* 30.6* 30.7*  MCV 100.0 105.9* 101.0*  PLT 172 140* 063*   Basic Metabolic Panel: Recent Labs  Lab 09/05/18 2211  09/06/18 0356 09/07/18 0819  NA 148* 148* 147*  K 5.2* 5.2* 4.6  CL 124* 127* 124*  CO2 14* 11* 10*  GLUCOSE 216* 219* 213*  BUN 61* 56* 38*  CREATININE 2.56* 2.42* 2.28*  CALCIUM 10.0 9.5 9.1  PHOS  --  3.1  --    GFR: Estimated Creatinine Clearance: 29.7 mL/min (A) (by C-G formula based on SCr of 2.28 mg/dL (H)). Liver Function Tests: Recent Labs  Lab 09/05/18 2211 09/06/18 0356  AST 30  --   ALT 13  --   ALKPHOS 89  --   BILITOT 0.7  --   PROT 6.7  --   ALBUMIN 3.7 3.5   No results for input(s): LIPASE, AMYLASE in the last 168 hours. No results for input(s): AMMONIA in the last 168 hours. Coagulation Profile: No results for input(s): INR, PROTIME in the last 168 hours. Cardiac Enzymes: No results for input(s): CKTOTAL, CKMB, CKMBINDEX, TROPONINI in the last 168 hours. BNP (last 3 results) No results for input(s): PROBNP in the last 8760 hours. HbA1C: No results for input(s): HGBA1C in the last 72 hours. CBG: Recent Labs  Lab 09/06/18 0803 09/06/18 1434 09/06/18 1728 09/06/18 2246 09/07/18 0749  GLUCAP 193* 193* 180* 232* 169*   Lipid Profile: No results for input(s): CHOL, HDL, LDLCALC, TRIG, CHOLHDL, LDLDIRECT in the last 72 hours. Thyroid Function Tests: Recent Labs    09/06/18 0356  TSH 2.422   Anemia Panel: No results for input(s): VITAMINB12, FOLATE, FERRITIN, TIBC, IRON, RETICCTPCT in the last 72 hours. Sepsis Labs: Recent Labs  Lab 09/06/18 0132 09/06/18 0356 09/06/18 0406  PROCALCITON  --  0.15  --   LATICACIDVEN 1.07  --  1.29    Recent Results (from the past 240 hour(s))  Blood Culture (routine x 2)     Status: None (Preliminary result)   Collection Time: 09/06/18  1:22 AM  Result Value Ref Range Status   Specimen Description   Final    BLOOD LEFT FOREARM Performed at [facility], [place] [address], [place], [place] [address]    Special Requests   Final    BOTTLES DRAWN AEROBIC AND ANAEROBIC Blood Culture adequate  volume Performed at [place] [facility] [address], [place], [place] [address]    Culture   Final    NO GROWTH 1 DAY Performed at [facility], [place] [address], [place], [place] [address]    Report Status PENDING  Incomplete  Blood Culture (routine x 2)     Status: None (Preliminary result)   Collection Time: 09/06/18  1:22 AM  Result Value Ref Range Status   Specimen Description   Final    BLOOD RIGHT ANTECUBITAL Performed at [facility] [address], [place], [place] [address]    Special Requests   Final    AEROBIC BOTTLE ONLY Blood Culture adequate volume Performed at [facility] [address], [place], [place] [address]    Culture  Final    NO GROWTH 1 DAY Performed at [facility], [place] [address] [place], [place] [address]    Report Status PENDING  Incomplete         Radiology Studies: Dg Chest 2 View  Result Date: 09/06/2018 CLINICAL DATA:  Cough.  Altered mental status. EXAM: CHEST - 2 VIEW COMPARISON:  Chest x-ray dated December 11, 2015. FINDINGS: The heart remains at the upper limits of normal in size. Mild pulmonary vascular congestion. Mild patchy density in the medial right lower lobe. No pleural effusion or pneumothorax. No acute osseous abnormality. IMPRESSION: 1. Right lower lobe atelectasis versus infiltrate. 2. Mild pulmonary vascular congestion. Electronically Signed   By: Titus Dubin M.D.   On: 09/06/2018 00:42   Ct Head Wo Contrast  Result Date: 09/06/2018 CLINICAL DATA:  Altered mental status. EXAM: CT HEAD WITHOUT CONTRAST TECHNIQUE: Contiguous axial images were obtained from the base of the skull through the vertex without intravenous contrast. COMPARISON:  None. FINDINGS: Despite efforts by the technologist and patient, motion artifact is present on today's exam and could not be eliminated. This reduces exam sensitivity and specificity. Brain: No evidence of acute infarction,  hemorrhage, hydrocephalus, extra-axial collection or mass lesion/mass effect. Mild to moderate age related cerebral atrophy. Vascular: Calcified atherosclerosis at the skullbase. No hyperdense vessel. Skull: Normal. Negative for fracture or focal lesion. Sinuses/Orbits: No acute finding. Other: None. IMPRESSION: 1. Motion limited study. No definite acute intracranial abnormality. Electronically Signed   By: Titus Dubin M.D.   On: 09/06/2018 01:41        Scheduled Meds: . aspirin EC  81 mg Oral Daily  . atorvastatin  10 mg Oral Daily  . calcitRIOL  0.25 mcg Oral Once per day on Mon Wed Fri  . carvedilol  6.25 mg Oral BID  . heparin  5,000 Units Subcutaneous Q8H  . hydrALAZINE  25 mg Oral TID  . insulin aspart  0-15 Units Subcutaneous TID WC  . insulin aspart  0-5 Units Subcutaneous QHS  . latanoprost  1 drop Both Eyes QHS  . mycophenolate  500 mg Oral BID  . predniSONE  5 mg Oral Daily  . sodium chloride flush  3 mL Intravenous Q12H  . tacrolimus  1 mg Oral BID   Continuous Infusions: . azithromycin Stopped (09/07/18 0206)  . ceFEPime (MAXIPIME) IV    .  sodium bicarbonate (isotonic) infusion in sterile water       LOS: 0 days    Time spent: 35 minutes.     Elmarie Shiley, MD [facility] Pager [phone]  If 7PM-7AM, please contact night-coverage [email or web address] Password [email or web address] 09/07/2018, 8:52 AM

## 2018-09-07 NOTE — Consult Note (Addendum)
Renal Service Consult Note [facility]  [name] 09/07/2018 [name] Requesting Physician:  Dr Tyrell Antonio  Reason for Consult:  Transplant pt w/ PNA HPI: The patient is a 72 y.o. year-old with hx of renal transplant (1983 and 2005), DM2, HTN, HL and atrial fib who is admitted 09/05/18 for AMS, high fevers, cough and diarrhea. CXR RLL opacity vs atx. UA negative. Admitted and started on IV abx and IVF's for CAP.  Asked to see for renal failure.   Patient is alert but confused, answers simple questions ok and following commands, but disoriented. C/O severe pain in R hand > wrist.  Mild cough per the sitter.  No cp, no abd pain no n/v.    ROS  denies CP  no joint pain   no HA  no blurry vision  no rash  no nausea/ vomiting  no dysuria  no difficulty voiding  no change in urine color    Past Medical History  Past Medical History:  Diagnosis Date  . Anemia   . Atrial fibrillation ([place])   . Chronic kidney disease   . Diabetes mellitus   . Dysrhythmia   . Hyperlipidemia   . Neuropathy    Past Surgical History  Past Surgical History:  Procedure Laterality Date  . access proceds    . achlles tendon repair    . ARTERIOVENOUS GRAFT PLACEMENT    . bowel repla     repair  . [place] and 2005  . LIGATION GORETEX GRAFT  06/30/11   Right upper arm   Family History  Family History  Problem Relation Age of Onset  . Heart disease Mother   . Heart attack Father   . Stroke Sister    Social History  reports that he has never smoked. He has never used smokeless tobacco. He reports that he does not drink alcohol or use drugs. Allergies  Allergies  Allergen Reactions  . Elavil [Amitriptyline Hcl] Other (See Comments)    Unknown felt as if he was tripping   Home medications Prior to Admission medications   Medication Sig Start Date End Date Taking? Authorizing Provider  acetaminophen (TYLENOL) 500 MG tablet Take 1,000 mg by mouth  every 8 (eight) hours as needed for moderate pain.    Yes [provider]  aspirin 81 MG tablet Take 81 mg by mouth daily.     Yes [provider]  atorvastatin (LIPITOR) 10 MG tablet Take 10 mg by mouth daily.   Yes [provider]  BD PEN NEEDLE NANO U/F 32G X 4 MM MISC Use as directed 09/25/15  Yes [provider]  calcitRIOL (ROCALTROL) 0.25 MCG capsule Take 0.25 mcg by mouth 3 (three) times a week. (Mondays, Wednesdays, and Fridays) 07/22/15  Yes [provider]  carvedilol (COREG) 6.25 MG tablet Take 1 tablet (6.25 mg total) by mouth 2 (two) times daily. 09/29/17  Yes Belva Crome, MD  diphenhydrAMINE (BENADRYL) 25 mg capsule Take 25 mg by mouth at bedtime as needed (for sleep/allergies.).    Yes [provider]  furosemide (LASIX) 40 MG tablet Take 1 tablet (40 mg total) by mouth daily. Fluid. 04/25/17  Yes Belva Crome, MD  gabapentin (NEURONTIN) 100 MG capsule Take 100 mg by mouth 3 (three) times daily as needed (for nerve pain). Take three times/day as needed   Yes [provider]  HUMALOG KWIKPEN 100 UNIT/ML KwikPen Inject 3-5 Units into the skin  3 (three) times daily. 07/17/18  Yes [provider]  hydrALAZINE (APRESOLINE) 25 MG tablet Take 25 mg by mouth 3 (three) times daily.  08/22/18  Yes [provider]  LANTUS SOLOSTAR 100 UNIT/ML Solostar Pen Inject 7-15 Units into the skin daily. If blood sugars are >300, use 15 units, <300 use 7 units 06/04/14  Yes [provider]  latanoprost (XALATAN) 0.005 % ophthalmic solution Place 1 drop into both eyes at bedtime. 07/28/18  Yes [provider]  linagliptin (TRADJENTA) 5 MG TABS tablet Take 5 mg by mouth daily.     Yes [provider]  losartan (COZAAR) 25 MG tablet TAKE 1 TABLET DAILY. PLEASE MAKE YEARLY APPOINTMENT WITH DR Tamala Julian FOR JULY BEFORE ANY MORE REFILLS. FIRST ATTEMPT. [phone]. Patient taking differently: Take 25 mg by mouth  daily.  05/25/18  Yes Belva Crome, MD  mycophenolate (CELLCEPT) 500 MG tablet Take 500 mg by mouth 2 (two) times daily.    Yes [provider]  predniSONE (DELTASONE) 5 MG tablet Take 5 mg by mouth daily. 07/07/18  Yes [provider]  tacrolimus (PROGRAF) 1 MG capsule Take 1 mg by mouth 2 (two) times daily.    Yes [provider]  VASCEPA 1 g CAPS Take 4 g by mouth daily. 11/24/16  Yes [provider]  Alcohol Swabs (B-D SINGLE USE SWABS REGULAR) PADS Use as directed 09/07/15   [provider]  BAYER CONTOUR NEXT TEST test strip Use as directed 09/12/15   [provider]  HYDROcodone-acetaminophen (NORCO/VICODIN) 5-325 MG tablet Take 1 tablet by mouth every 8 (eight) hours as needed for severe pain. Patient not taking: Reported on 09/06/2018 08/02/18   Lorin Glass, PA-C  MICROLET LANCETS MISC Use as directed 07/14/15   [provider]   Liver Function Tests Recent Labs  Lab 09/05/18 2211 09/06/18 0356  AST 30  --   ALT 13  --   ALKPHOS 89  --   BILITOT 0.7  --   PROT 6.7  --   ALBUMIN 3.7 3.5   No results for input(s): LIPASE, AMYLASE in the last 168 hours. CBC Recent Labs  Lab 09/05/18 2211 09/06/18 0357 09/07/18 0819  WBC 5.7 6.6 11.4*  HGB 9.0* 8.5* 8.9*  HCT 31.7* 30.6* 30.7*  MCV 100.0 105.9* 101.0*  PLT 172 140* 419*   Basic Metabolic Panel Recent Labs  Lab 09/05/18 2211 09/06/18 0356 09/07/18 0819  NA 148* 148* 147*  K 5.2* 5.2* 4.6  CL 124* 127* 124*  CO2 14* 11* 10*  GLUCOSE 216* 219* 213*  BUN 61* 56* 38*  CREATININE 2.56* 2.42* 2.28*  CALCIUM 10.0 9.5 9.1  PHOS  --  3.1  --    Iron/TIBC/Ferritin/ %Sat No results found for: IRON, TIBC, FERRITIN, IRONPCTSAT  Vitals:   09/06/18 2301 09/07/18 0528 09/07/18 0538 09/07/18 0748  BP: (!) 160/96  136/60   Pulse: (!) 111  (!) 108   Resp: 16  18   Temp: 97.7 F (36.5 C)  (!) 102.7 F (39.3 C) (!) 101.8 F (38.8 C)  TempSrc: Oral   Axillary Oral  SpO2: 95%  97%   Weight:  79.9 kg    Height:       Exam Gen alert, not in distress, R hand in a mitt No rash, cyanosis or gangrene Sclera anicteric, throat dry  No jvd or bruits, flat neck veins Chest clear bilat to bases RRR no MRG Abd soft ntnd no mass  or ascites +bs GU normal male w condom cath MS back of R hand extremely tender, w/ edema and erythema over the PCP joint 1-4 and over the back of the hand, possibly the R wrist as well Ext no LE edema, no wounds or ulcers Neuro is alert, NF    Home meds:  - carvedilol 6.25 bid/ furosemide 40 qd/ hydralazine 25 tid/ losartan 25 qd  - tacrolimus 1 bid/ prednisone 5 qd/ mycophenolate 500 bid  - linagliptin 5 qd  - atrovastatin 10/ aspirin 81  - gabapentin 100 prn/ prn's / vitamins   UA 12/18 > clear/ negative  CXR - questionable infiltrate/ atx R base    Impression/ Plan: 1. Fever - possible PNA, ?R hand infection 2. CKD / renal Tx - creat not far from baseline, also looks dry, will need hypotonic IVF's / bicarb. Cont renal Tx meds 3. Hypernatremia - hypovolemic, treat w/ D5W 4. AMS - prob d/t #1 5. DM2 on SSI     Kelly Splinter MD [facility] pager [phone]   09/07/2018, 10:42 AM

## 2018-09-07 NOTE — Consult Note (Signed)
[place] Nurse wound consult note Reason for Consult: LLE wound, reported to be trauma from car door Wound type:full thickness wound Pressure Injury POA: NA Measurement: 4cm x 1.7cm x 0.1cm  Wound bed: 100% clean, pink, moist Drainage (amount, consistency, odor) scant, no odor Periwound: intact, distal pulses intact  Dressing procedure/placement/frequency: Single layer cut to fit xeroform, cover with foam Change M/W/F.   Follow up with primary care or wound care center as scheduled [place], [place], [place]

## 2018-09-07 NOTE — Care Management Obs Status (Signed)
[place] NOTIFICATION   Patient Details  Name: Praise Dolecki MRN: [id number] Date of Birth: Mar 12, 1946   Medicare Observation Status Notification Given:  Other (see comment) Pt is confused, no family available, unable to sign.    Purcell Mouton, RN 09/07/2018, 3:29 PM

## 2018-09-07 NOTE — Progress Notes (Signed)
Pt elevated temp, MD made aware on the unit @ pt bedside. Order complete as ordered. SRP, RN

## 2018-09-08 LAB — GLUCOSE, CAPILLARY
Glucose-Capillary: 354 mg/dL — ABNORMAL HIGH (ref 70–99)
Glucose-Capillary: 398 mg/dL — ABNORMAL HIGH (ref 70–99)
Glucose-Capillary: 308 mg/dL — ABNORMAL HIGH (ref 70–99)
Glucose-Capillary: 229 mg/dL — ABNORMAL HIGH (ref 70–99)

## 2018-09-08 LAB — CBC
HCT: 27.6 % — ABNORMAL LOW (ref 39.0–52.0)
Hemoglobin: 8 g/dL — ABNORMAL LOW (ref 13.0–17.0)
MCH: 29.4 pg (ref 26.0–34.0)
MCHC: 29 g/dL — ABNORMAL LOW (ref 30.0–36.0)
MCV: 101.5 fL — AB (ref 80.0–100.0)
Platelets: 104 10*3/uL — ABNORMAL LOW (ref 150–400)
RBC: 2.72 MIL/uL — ABNORMAL LOW (ref 4.22–5.81)
RDW: 13.6 % (ref 11.5–15.5)
WBC: 12.6 10*3/uL — ABNORMAL HIGH (ref 4.0–10.5)
nRBC: 0 % (ref 0.0–0.2)

## 2018-09-08 LAB — LEGIONELLA PNEUMOPHILA SEROGP 1 UR AG: L. PNEUMOPHILA SEROGP 1 UR AG: NEGATIVE

## 2018-09-08 LAB — BASIC METABOLIC PANEL
ANION GAP: 10 (ref 5–15)
BUN: 35 mg/dL — ABNORMAL HIGH (ref 8–23)
CO2: 13 mmol/L — ABNORMAL LOW (ref 22–32)
Calcium: 8.5 mg/dL — ABNORMAL LOW (ref 8.9–10.3)
Chloride: 115 mmol/L — ABNORMAL HIGH (ref 98–111)
Creatinine, Ser: 2.31 mg/dL — ABNORMAL HIGH (ref 0.61–1.24)
GFR calc Af Amer: 32 mL/min — ABNORMAL LOW (ref >60)
GFR calc non Af Amer: 27 mL/min — ABNORMAL LOW (ref >60)
GLUCOSE: 369 mg/dL — AB (ref 70–99)
Potassium: 4.5 mmol/L (ref 3.5–5.1)
Sodium: 138 mmol/L (ref 135–145)

## 2018-09-08 MED ORDER — INSULIN ASPART 100 UNIT/ML ~~LOC~~ SOLN
3.0000 [IU] | Freq: Three times a day (TID) | SUBCUTANEOUS | Status: DC
  Administered 2018-09-08 – 2018-09-11 (×10): 3 [IU] via SUBCUTANEOUS

## 2018-09-08 MED ORDER — PREDNISONE 20 MG PO TABS
40.0000 mg | ORAL_TABLET | Freq: Every day | ORAL | Status: DC
  Administered 2018-09-08 – 2018-09-10 (×3): 40 mg via ORAL
  Filled 2018-09-08 (×3): qty 2

## 2018-09-08 MED ORDER — HYDROCODONE-ACETAMINOPHEN 5-325 MG PO TABS: 1.0000 | ORAL_TABLET | Freq: Four times a day (QID) | ORAL | Status: DC | PRN

## 2018-09-08 MED ORDER — COLCHICINE 0.6 MG PO TABS
0.3000 mg | ORAL_TABLET | Freq: Every day | ORAL | Status: DC
  Administered 2018-09-08 – 2018-09-11 (×4): 0.3 mg via ORAL
  Filled 2018-09-08 (×4): qty 0.5

## 2018-09-08 MED ORDER — AZITHROMYCIN 250 MG PO TABS
500.0000 mg | ORAL_TABLET | Freq: Every day | ORAL | Status: DC
  Administered 2018-09-09 – 2018-09-10 (×2): 500 mg via ORAL
  Filled 2018-09-08 (×2): qty 2

## 2018-09-08 MED ORDER — INSULIN GLARGINE 100 UNIT/ML ~~LOC~~ SOLN
15.0000 [IU] | Freq: Every day | SUBCUTANEOUS | Status: DC
  Administered 2018-09-09 – 2018-09-10 (×2): 15 [IU] via SUBCUTANEOUS
  Filled 2018-09-08 (×2): qty 0.15

## 2018-09-08 NOTE — Plan of Care (Signed)
Pt unable to take in adequate nutrition at this time d/t lethargy. Pt's daughter states that pt is more settled this shift than last night.

## 2018-09-08 NOTE — Progress Notes (Signed)
Pt is significantly more alert this am. He is less lethargic and is able to tell you his name and his wife and daughters' names. He is disoriented to place, time and situation, but there is definitely improvement in his mental status.

## 2018-09-08 NOTE — Progress Notes (Signed)
[facility] Progress Note  Subjective: still confused.  Na 138, creat up to 2.3   Vitals:   09/07/18 0748 09/07/18 1522 09/07/18 2202 09/08/18 0501  BP:  127/65 134/72 (!) 130/59  Pulse:  69 71 70  Resp:  20 20 20   Temp: (!) 101.8 F (38.8 C) 98.1 F (36.7 C) 98.8 F (37.1 C) 99.6 F (37.6 C)  TempSrc: Oral Oral Oral Oral  SpO2:  96% 96% 97%  Weight:      Height:        Inpatient medications: . allopurinol  100 mg Oral Daily  . aspirin EC  81 mg Oral Daily  . atorvastatin  10 mg Oral Daily  . calcitRIOL  0.25 mcg Oral Once per day on Mon Wed Fri  . carvedilol  6.25 mg Oral BID  . colchicine  0.3 mg Oral Daily  . heparin  5,000 Units Subcutaneous Q8H  . insulin aspart  0-15 Units Subcutaneous TID WC  . insulin glargine  10 Units Subcutaneous Daily  . latanoprost  1 drop Both Eyes QHS  . mycophenolate  500 mg Oral BID  . predniSONE  40 mg Oral Q breakfast  . sodium chloride flush  3 mL Intravenous Q12H  . tacrolimus  1 mg Oral BID   . azithromycin 500 mg (09/08/18 0300)  . ceFEPime (MAXIPIME) IV 2 g (09/08/18 0933)  .  sodium bicarbonate (isotonic) infusion in sterile water 75 mL/hr at 09/08/18 0921  . [START ON 09/09/2018] vancomycin     acetaminophen, albuterol, gabapentin, HYDROcodone-acetaminophen, lip balm, morphine injection, ondansetron **OR** ondansetron (ZOFRAN) IV, polyvinyl alcohol, traZODone  Iron/TIBC/Ferritin/ %Sat No results found for: IRON, TIBC, FERRITIN, IRONPCTSAT  Exam: Gen alert, not in distress, confused/ disoriented , alert No rash, cyanosis or gangrene Sclera anicteric, throat dry  No jvd or bruits, flat neck veins Chest clear bilat to bases RRR no MRG Abd soft ntnd no mass or ascites +bs GU normal male w condom cath MS R post hand erythema resolved, still tender but swelling gone mostly Ext no LE edema, no wounds or ulcers Neuro is alert, NF    Home meds:  - carvedilol 6.25 bid/ furosemide 40 qd/ hydralazine 25 tid/  losartan 25 qd  - tacrolimus 1 bid/ prednisone 5 qd/ mycophenolate 500 bid  - linagliptin 5 qd  - atrovastatin 10/ aspirin 81  - gabapentin 100 prn/ prn's / vitamins   UA 12/18 > clear/ negative  CXR - questionable infiltrate/ atx R base    Impression/ Plan: 1. PNA - on IV abx, temps coming down 2. R hand pain - on Rx for acute gout, looks better 3. CKD / renal Tx - close to baseline, creat was 2.0 in Sept 2018.  Making good amts of urine. Cont IVF's/ bicarb gtt for now.  4. Hypernatremia - resolved, D5W has been stopped  5. AMS - prob d/t #1, persistent 6. DM2 on SSI 7. HTN - bp's good, holding lasix and ARB, ok to use other BP meds prn   Kelly Splinter MD [facility] pager [phone]   09/08/2018, 9:59 AM   Recent Labs  Lab 09/05/18 2211 09/06/18 0356 09/07/18 0819 09/08/18 0414  NA 148* 148* 147* 138  K 5.2* 5.2* 4.6 4.5  CL 124* 127* 124* 115*  CO2 14* 11* 10* 13*  GLUCOSE 216* 219* 213* 369*  BUN 61* 56* 38* 35*  CREATININE 2.56* 2.42* 2.28* 2.31*  CALCIUM 10.0 9.5 9.1 8.5*  PHOS  --  3.1  --   --   ALBUMIN 3.7 3.5  --   --    Recent Labs  Lab 09/05/18 2211  AST 30  ALT 13  ALKPHOS 89  BILITOT 0.7  PROT 6.7   Recent Labs  Lab 09/07/18 0819 09/08/18 0414  WBC 11.4* 12.6*  HGB 8.9* 8.0*  HCT 30.7* 27.6*  MCV 101.0* 101.5*  PLT 133* 104*

## 2018-09-08 NOTE — Progress Notes (Signed)
Inpatient Diabetes Program Recommendations  AACE/ADA: New Consensus Statement on Inpatient Glycemic Control (2015)  Target Ranges:  Prepandial:   less than 140 mg/dL      Peak postprandial:   less than 180 mg/dL (1-2 hours)      Critically ill patients:  140 - 180 mg/dL   Lab Results  Component Value Date   GLUCAP 398 (H) 09/08/2018   HGBA1C 8.8 (H) 07/01/2011    Review of Glycemic Control  Blood sugars today - 369, 354, 398 Needs insulin adjustment.  Inpatient Diabetes Program Recommendations:     Increase Lantus to 15 units QD Add Novolog 4 units tidwc for meal coverage insulin if pt eats > 50% meal  Continue to follow.   Thank you. Lorenda Peck, RD, LDN, CDE Inpatient Diabetes Coordinator [phone]

## 2018-09-08 NOTE — Evaluation (Signed)
Physical Therapy Evaluation Patient Details Name: [name] MRN: [id number] DOB: 1946-01-09 Today's Date: 09/08/2018   History of Present Illness  72 yo male admitted to ED on 12/17 with confusion, lack of appetite. Pt with medical diagnosis of CAP with potential sepsis. 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   Clinical Impression   Pt presents with R hand pain, LE weakness, cognitive impairments, difficulty performing mobility tasks, and decreased tolerance for activity. Pt to benefit from acute PT to address deficits. Pt required mod-max assist +2 for mobility today. Pt currently limited by weakness and cognitive status. PT recommending ST-SNF to address mobility deficits and for pt safety. PT to progress mobility as tolerated, and will continue to follow acutely.      Follow Up Recommendations SNF;Supervision/Assistance - 24 hour    Equipment Recommendations  Other (comment)(defer to next venue )    Recommendations for Other Services       Precautions / Restrictions Precautions Precautions: Fall Restrictions Weight Bearing Restrictions: No      Mobility  Bed Mobility Overal bed mobility: Needs Assistance Bed Mobility: Supine to Sit;Sit to Supine     Supine to sit: Max assist;HOB elevated;+2 for physical assistance Sit to supine: HOB elevated;Mod assist;+2 for physical assistance   General bed mobility comments: Max assist +2 for supine to sit for LE management, trunk elevation, and scooting to EOB with use of bed pad. Mod assist +2 for sit to supine, as pt able to assist with trunk lowering. Pt able to scoot himself up in bed with verbal cues.   Transfers Overall transfer level: Needs assistance Equipment used: Rolling walker (2 wheeled) Transfers: Sit to/from Stand Sit to Stand: Mod assist;+2 physical assistance;From elevated surface          General transfer comment: Mod assist for power up, placing and keeping hands on RW as pt tended to lean posteriorly and let go of RW, LE guarding, postural control in standing, and steadying. PT and NT assisted pt in taking 1 step foward, unable to ambulate due to pt with posterior leaning and lack of safety awareness in standing. Pt stepped backwards with mod assist and sat.   Ambulation/Gait Ambulation/Gait assistance: (NT- pt unsafe to ambulate at this time due to lack of safety awareness, posterior postural lean, and LE weakness )              Stairs            Wheelchair Mobility    Modified Rankin (Stroke Patients Only)       Balance Overall balance assessment: Needs assistance Sitting-balance support: No upper extremity supported Sitting balance-Leahy Scale: Fair Sitting balance - Comments: able to sit without support (once steadied) in sitting    Standing balance support: Bilateral upper extremity supported Standing balance-Leahy Scale: Zero Standing balance comment: PT and NT providing standing balance, pt not able to stand or stay standing without +2 assist                             Pertinent Vitals/Pain Pain Assessment: Faces Faces Pain Scale: Hurts little more Pain Location: Joints ("my body is sore") Pain Descriptors / Indicators: Sore Pain Intervention(s): Limited activity within patient's tolerance;Repositioned;Monitored during session    Home Living Family/patient expects to be discharged to:: Private residence Living Arrangements: Spouse/significant other Available Help at  Discharge: Family;Available PRN/intermittently Type of Home: House Home Access: Stairs to enter   [employer] of Steps: unsure           Prior Function Level of Independence: Independent         Comments: Unsure of pt's home environment, no family present to determine this. Per RN, pt's wife states that pt is independent and active at home.  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      Hand Dominance        Extremity/Trunk Assessment   Upper Extremity Assessment Upper Extremity Assessment: Defer to OT evaluation    Lower Extremity Assessment Lower Extremity Assessment: Generalized weakness;Difficult to assess due to impaired cognition(Pt did not follow cues to AROM of LEs, states "I can't move my legs, they told me not to" )    Cervical / Trunk Assessment Cervical / Trunk Assessment: Normal  Communication   Communication: Other (comment)(Pt difficult to understand at times. )  Cognition Arousal/Alertness: Awake/alert Behavior During Therapy: WFL for tasks assessed/performed Overall Cognitive Status: Impaired/Different from baseline Area of Impairment: Orientation;Attention;Memory;Following commands;Safety/judgement;Problem solving                 Orientation Level: Disoriented to;Place;Situation Current Attention Level: Selective Memory: Decreased short-term memory Following Commands: Follows one step commands inconsistently;Follows one step commands with increased time Safety/Judgement: Decreased awareness of deficits;Decreased awareness of safety   Problem Solving: Slow processing;Difficulty sequencing;Decreased initiation;Requires verbal cues;Requires tactile cues General Comments: Pt able to state name, birthday, and wife's name. Pt states the correct year, but states that [employer] is president. Pt believes he is in his living room currently, reoriented to hospital x4 during session. Pt denies that he is in the hospital during evaluation. Pt reminded of PT's name x5 during session, unable to recall 5 seconds after being told name. Pt with difficulty with command following during session.       General Comments General comments (skin integrity, edema, etc.): Pt with white, sloughy appearance of tongue and mouth. NT aware. Pt with bandage over L shin. Pt with tenderness and swelling of R hand,  being treated for inflammation/gout.     Exercises     Assessment/Plan    PT Assessment Patient needs continued PT services  PT Problem List Decreased strength;Pain;Decreased range of motion       PT Treatment Interventions DME instruction;Therapeutic activities;Gait training;Therapeutic exercise;Patient/family education;Balance training;Functional mobility training    PT Goals (Current goals can be found in the Care Plan section)  Acute Rehab PT Goals PT Goal Formulation: Patient unable to participate in goal setting Time For Goal Achievement: 09/22/18 Potential to Achieve Goals: Good    Frequency Min 2X/week   Barriers to discharge        Co-evaluation               AM-PAC PT "6 Clicks" Mobility  Outcome Measure Help needed turning from your back to your side while in a flat bed without using bedrails?: A Lot Help needed moving from lying on your back to sitting on the side of a flat bed without using bedrails?: A Lot Help needed moving to and from a bed to a chair (including a wheelchair)?: A Lot Help needed standing up from a chair using your arms (e.g., wheelchair or bedside chair)?: A Lot Help needed to walk in hospital room?: Total Help needed climbing 3-5 steps with a railing? : Total 6 Click Score: 10    End of Session Equipment Utilized  During Treatment: Gait belt Activity Tolerance: Patient limited by fatigue;Other (comment)(Pt limited by weakness) Patient left: in bed;with bed alarm set;with nursing/sitter in room;with call bell/phone within reach Nurse Communication: Mobility status PT Visit Diagnosis: Unsteadiness on feet (R26.81);Other abnormalities of gait and mobility (R26.89)    Time: 7846-9629 PT Time Calculation (min) (ACUTE ONLY): 27 min   Charges:   PT Evaluation $PT Eval Moderate Complexity: 1 Mod PT Treatments $Therapeutic Activity: 8-22 mins        Julien Girt, PT Acute Rehabilitation Services Pager [phone]  Office  [phone]   Bernardina Cacho D Elonda Husky 09/08/2018, 3:36 PM
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
## 2018-09-08 NOTE — Progress Notes (Signed)
PROGRESS NOTE    [name]  YYT:[id number] DOB: Nov 01, 1945 DOA: 09/05/2018 PCP: [name], MD    Brief Narrative: 72 year old with past medical history significant for paroxysmal A. fib, diabetes type 2, status post renal transplant 2005, hypertension who presents with altered mental status.  Wife provide history, she relate that patient has been having a cough for the last week, accompanied with fever of 102.  Evaluation in the emergency department patient was found to have acute on chronic renal failure metabolic acidosis, febrile.  Chest x-ray showing right lower lobe infiltrate versus atelectasis.  Assessment & Plan:   Principal Problem:   Acute metabolic encephalopathy Active Problems:   Kidney transplant status, cadaveric   Immunosuppressed status ([place])   Anemia due to chronic kidney disease   Diabetes mellitus type 2, insulin dependent ([place])   Community acquired pneumonia   1-Acute metabolic encephalopathy; Patient is still confused, more alert. Suspect related to infection and metabolic abnormality. More alert, still confuse.   2-Sepsis; could be related to pneumonia versus hand infection..  Patient on immunosuppressive medications.  Continue with IV fluids. Stop vancomycin, continue with cefepime.   3-Metabolic acidosis, bicarb at 10.  Continue with  IV bicarb gtt.  Bicarb today at 42.   4-AKI on chronic Kidney diseases III  History of renal transplant. Cr six month ago 2.0.  Tacrolimus level pending.  Nephrology consulted, appreciate Dr Melvia Heaps help.  Continue with IV fluids.   5-Right had swelling, pain, Gout Ortho consulted. Appreciate Dr Theda Sers help. Treat for gout  Continue with allopurinol, start colchicine renal dose.  Increase chronic prednisone to 40 mg daily.   A flutter; continue with carvedilol.   DM type 2; hyperglycemia;  SSI.  Start lantus, increase dose to 15 units.   HTN; hold hydralazine.   Thrombocytopenia; follow  trend.  Anemia; likely of chronic diseases, follow trend.   RN Pressure Injury Documentation:    Malnutrition Type:      Malnutrition Characteristics:      Nutrition Interventions:     Estimated body mass index is 26.01 kg/m as calculated from the following:   Height as of this encounter: 5\' 9"  (1.753 m).   Weight as of this encounter: 79.9 kg.   DVT prophylaxis: Heparin  Code Status: full code.  Family Communication: wife at bedside.  Disposition Plan: remain in the hospital for IV fluids, antibiotics.   Consultants:   Nephrology  Hand surgeon.    Procedures:   none   Antimicrobials:  Vancomycin  cefepime   Subjective: Alert, confuse. Productive cough. Right hand pain improved.   Objective: Vitals:   09/07/18 1522 09/07/18 2202 09/08/18 0501 09/08/18 1358  BP: 127/65 134/72 (!) 130/59 106/74  Pulse: 69 71 70 73  Resp: 20 20 20    Temp: 98.1 F (36.7 C) 98.8 F (37.1 C) 99.6 F (37.6 C)   TempSrc: Oral Oral Oral   SpO2: 96% 96% 97% 95%  Weight:      Height:        Intake/Output Summary (Last 24 hours) at 09/08/2018 1419 Last data filed at 09/08/2018 1418 Gross per 24 hour  Intake 3655.62 ml  Output 700 ml  Net 2955.62 ml   Filed Weights   09/05/18 2203 09/06/18 1623 09/07/18 0528  Weight: 77.1 kg 78.1 kg 79.9 kg    Examination:  General exam: Alert, ill appearing Respiratory system: CTA Cardiovascular system: S 1, S 2 RRR Gastrointestinal system: BS present, soft, nt Central nervous system: alert, confuse Extremities:  no edema Skin:  LE with open wound healing. Right hand with less edema and redness   Data Reviewed: I have personally reviewed following labs and imaging studies  CBC: Recent Labs  Lab 09/05/18 2211 09/06/18 0357 09/07/18 0819 09/08/18 0414  WBC 5.7 6.6 11.4* 12.6*  HGB 9.0* 8.5* 8.9* 8.0*  HCT 31.7* 30.6* 30.7* 27.6*  MCV 100.0 105.9* 101.0* 101.5*  PLT 172 140* 133* 270*   Basic Metabolic  Panel: Recent Labs  Lab 09/05/18 2211 09/06/18 0356 09/07/18 0819 09/08/18 0414  NA 148* 148* 147* 138  K 5.2* 5.2* 4.6 4.5  CL 124* 127* 124* 115*  CO2 14* 11* 10* 13*  GLUCOSE 216* 219* 213* 369*  BUN 61* 56* 38* 35*  CREATININE 2.56* 2.42* 2.28* 2.31*  CALCIUM 10.0 9.5 9.1 8.5*  PHOS  --  3.1  --   --    GFR: Estimated Creatinine Clearance: 29.3 mL/min (A) (by C-G formula based on SCr of 2.31 mg/dL (H)). Liver Function Tests: Recent Labs  Lab 09/05/18 2211 09/06/18 0356  AST 30  --   ALT 13  --   ALKPHOS 89  --   BILITOT 0.7  --   PROT 6.7  --   ALBUMIN 3.7 3.5   No results for input(s): LIPASE, AMYLASE in the last 168 hours. No results for input(s): AMMONIA in the last 168 hours. Coagulation Profile: No results for input(s): INR, PROTIME in the last 168 hours. Cardiac Enzymes: No results for input(s): CKTOTAL, CKMB, CKMBINDEX, TROPONINI in the last 168 hours. BNP (last 3 results) No results for input(s): PROBNP in the last 8760 hours. HbA1C: No results for input(s): HGBA1C in the last 72 hours. CBG: Recent Labs  Lab 09/07/18 1156 09/07/18 1714 09/07/18 2138 09/08/18 0732 09/08/18 1146  GLUCAP 301* 257* 290* 354* 398*   Lipid Profile: No results for input(s): CHOL, HDL, LDLCALC, TRIG, CHOLHDL, LDLDIRECT in the last 72 hours. Thyroid Function Tests: Recent Labs    09/06/18 0356  TSH 2.422   Anemia Panel: No results for input(s): VITAMINB12, FOLATE, FERRITIN, TIBC, IRON, RETICCTPCT in the last 72 hours. Sepsis Labs: Recent Labs  Lab 09/06/18 0132 09/06/18 0356 09/06/18 0406  PROCALCITON  --  0.15  --   LATICACIDVEN 1.07  --  1.29    Recent Results (from the past 240 hour(s))  Blood Culture (routine x 2)     Status: None (Preliminary result)   Collection Time: 09/06/18  1:22 AM  Result Value Ref Range Status   Specimen Description   Final    BLOOD LEFT FOREARM Performed at [facility], [place] [address], [place], [place] [address]     Special Requests   Final    BOTTLES DRAWN AEROBIC AND ANAEROBIC Blood Culture adequate volume Performed at [facility] [address] [place], [place] [address]    Culture   Final    NO GROWTH 2 DAYS Performed at [facility] [address] [place], [place] [address]    Report Status PENDING  Incomplete  Blood Culture (routine x 2)     Status: None (Preliminary result)   Collection Time: 09/06/18  1:22 AM  Result Value Ref Range Status   Specimen Description   Final    BLOOD RIGHT ANTECUBITAL Performed at [facility] [address], [place], [place] [address]    Special Requests   Final    AEROBIC BOTTLE ONLY Blood Culture adequate volume Performed at [facility]  [place] [address], [place], [place] [address]    Culture   Final    NO GROWTH 2 DAYS Performed at [facility] [address], [place], [place] [address]    Report Status PENDING  Incomplete  MRSA PCR Screening     Status: None   Collection Time: 09/07/18  8:45 AM  Result Value Ref Range Status   MRSA by PCR NEGATIVE NEGATIVE Final    Comment:        The GeneXpert MRSA Assay (FDA approved for NASAL specimens only), is one component of a comprehensive MRSA colonization surveillance program. It is not intended to diagnose MRSA infection nor to guide or monitor treatment for MRSA infections. Performed at [facility], [place] [address] [place], [place] [address]          Radiology Studies: Dg Hand 2 View Right  Result Date: 09/07/2018 CLINICAL DATA:  Hand pain.  Possible gout EXAM: RIGHT HAND - 2 VIEW COMPARISON:  04/18/2017 FINDINGS: Limited study. Contractures of the fingers. Motion degraded image on the lateral view. Negative for fracture. Cartilage loss and erosions of the first MCP joint unchanged. Cartilage loss and irregularity of the third MCP joint. These are stable from the prior study. Extensive arterial  calcification IMPRESSION: Cartilage loss and subchondral irregularity of the second and third MCP joints unchanged. This is not a typical location for gout. 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 Electronically Signed   By: Franchot Gallo M.D.   On: 09/07/2018 13:46        Scheduled Meds: . allopurinol  100 mg Oral Daily  . aspirin EC  81 mg Oral Daily  . atorvastatin  10 mg Oral Daily  . [START ON 09/09/2018] azithromycin  500 mg Oral Daily  . calcitRIOL  0.25 mcg Oral Once per day on Mon Wed Fri  . carvedilol  6.25 mg Oral BID  . colchicine  0.3 mg Oral Daily  . heparin  5,000 Units Subcutaneous Q8H  . insulin aspart  0-15 Units Subcutaneous TID WC  . insulin glargine  10 Units Subcutaneous Daily  . latanoprost  1 drop Both Eyes QHS  . mycophenolate  500 mg Oral BID  . predniSONE  40 mg Oral Q breakfast  . sodium chloride flush  3 mL Intravenous Q12H  . tacrolimus  1 mg Oral BID   Continuous Infusions: . ceFEPime (MAXIPIME) IV 2 g (09/08/18 0933)  .  sodium bicarbonate (isotonic) infusion in sterile water 75 mL/hr at 09/08/18 0921     LOS: 1 day    Time spent: 35 minutes.     Elmarie Shiley, MD [facility] Pager [phone]  If 7PM-7AM, please contact night-coverage [email or web address] Password [email or web address] 09/08/2018, 2:19 PM

## 2018-09-09 LAB — BASIC METABOLIC PANEL
Anion gap: 10 (ref 5–15)
BUN: 48 mg/dL — ABNORMAL HIGH (ref 8–23)
CO2: 20 mmol/L — ABNORMAL LOW (ref 22–32)
Calcium: 7.8 mg/dL — ABNORMAL LOW (ref 8.9–10.3)
Chloride: 100 mmol/L (ref 98–111)
Creatinine, Ser: 2.09 mg/dL — ABNORMAL HIGH (ref 0.61–1.24)
GFR calc Af Amer: 36 mL/min — ABNORMAL LOW (ref >60)
GFR calc non Af Amer: 31 mL/min — ABNORMAL LOW (ref >60)
Glucose, Bld: 229 mg/dL — ABNORMAL HIGH (ref 70–99)
Potassium: 3.9 mmol/L (ref 3.5–5.1)
Sodium: 130 mmol/L — ABNORMAL LOW (ref 135–145)

## 2018-09-09 LAB — CBC
HCT: 23.7 % — ABNORMAL LOW (ref 39.0–52.0)
Hemoglobin: 7.1 g/dL — ABNORMAL LOW (ref 13.0–17.0)
MCH: 28.6 pg (ref 26.0–34.0)
MCHC: 30 g/dL (ref 30.0–36.0)
MCV: 95.6 fL (ref 80.0–100.0)
Platelets: 96 10*3/uL — ABNORMAL LOW (ref 150–400)
RBC: 2.48 MIL/uL — AB (ref 4.22–5.81)
RDW: 13.3 % (ref 11.5–15.5)
WBC: 13.2 10*3/uL — ABNORMAL HIGH (ref 4.0–10.5)
nRBC: 0 % (ref 0.0–0.2)

## 2018-09-09 LAB — GLUCOSE, CAPILLARY
Glucose-Capillary: 229 mg/dL — ABNORMAL HIGH (ref 70–99)
Glucose-Capillary: 242 mg/dL — ABNORMAL HIGH (ref 70–99)
Glucose-Capillary: 312 mg/dL — ABNORMAL HIGH (ref 70–99)
Glucose-Capillary: 374 mg/dL — ABNORMAL HIGH (ref 70–99)

## 2018-09-09 MED ORDER — INSULIN ASPART 100 UNIT/ML ~~LOC~~ SOLN
0.0000 [IU] | Freq: Every day | SUBCUTANEOUS | Status: DC
  Administered 2018-09-09: 5 [IU] via SUBCUTANEOUS
  Administered 2018-09-10: 2 [IU] via SUBCUTANEOUS

## 2018-09-09 MED ORDER — INSULIN ASPART 100 UNIT/ML ~~LOC~~ SOLN
0.0000 [IU] | Freq: Three times a day (TID) | SUBCUTANEOUS | Status: DC
  Administered 2018-09-10: 5 [IU] via SUBCUTANEOUS
  Administered 2018-09-10: 3 [IU] via SUBCUTANEOUS
  Administered 2018-09-10: 11 [IU] via SUBCUTANEOUS
  Administered 2018-09-11 (×2): 5 [IU] via SUBCUTANEOUS
  Administered 2018-09-11: 3 [IU] via SUBCUTANEOUS

## 2018-09-09 MED ORDER — SODIUM BICARBONATE 650 MG PO TABS
1300.0000 mg | ORAL_TABLET | Freq: Two times a day (BID) | ORAL | Status: DC
  Administered 2018-09-09 – 2018-09-10 (×3): 1300 mg via ORAL
  Filled 2018-09-09 (×3): qty 2

## 2018-09-09 NOTE — Progress Notes (Addendum)
PROGRESS NOTE    Dub Maclellan  TFT:[id number] DOB: [birth year] DOA: 09/05/2018 PCP: [name], MD    Brief Narrative: 72 year old with past medical history significant for paroxysmal A. fib, diabetes type 2, status post renal transplant 2005, hypertension who presents with altered mental status.  Wife provide history, she relate that patient has been having a cough for the last week, accompanied with fever of 102.  Evaluation in the emergency department patient was found to have acute on chronic renal failure metabolic acidosis, febrile.  Chest x-ray showing right lower lobe infiltrate versus atelectasis.  Assessment & Plan:   Principal Problem:   Acute metabolic encephalopathy Active Problems:   Kidney transplant status, cadaveric   Immunosuppressed status ([place])   Anemia due to chronic kidney disease   Diabetes mellitus type 2, insulin dependent ([place])   Community acquired pneumonia   1-Acute metabolic encephalopathy; Patient is still confused, more alert. Suspect related to infection and metabolic abnormality. More alert, not oriented to place.  Improving.   2-Sepsis; could be related to pneumonia Patient on immunosuppressive medications.  Continue with IV fluids. Stop vancomycin, continue with cefepime.   3-Metabolic acidosis, bicarb at 10.  Continue with  IV bicarb gtt.  Improved today at 20/.  Stop IV fluids, will start sodium bicarb tablet.   4-AKI on chronic Kidney diseases III  History of renal transplant. Cr six month ago 2.0.  Tacrolimus level pending.  Nephrology consulted, appreciate Dr Melvia Heaps help.  Stop IV fluids, positive 7 L.   5-Right had swelling, pain, Gout Ortho consulted. Appreciate Dr Theda Sers help. Treat for gout  Continue with allopurinol, started colchicine renal dose.  Increase chronic prednisone to 40 mg daily.  Improving.   A flutter; continue with carvedilol.   Hyponatremia; repeat labs in am.   DM type 2; hyperglycemia;    SSI.  Start lantus, increase dose to 15 units.   HTN; hold hydralazine.   Thrombocytopenia; follow trend. Stop heparin/   Anemia; likely of chronic diseases, follow trend.  Hb trending down. Will check iron study. If continue to decrease might need blood transfusion.   RN Pressure Injury Documentation:    Malnutrition Type:      Malnutrition Characteristics:      Nutrition Interventions:     Estimated body mass index is 26.01 kg/m as calculated from the following:   Height as of this encounter: 5\' 9"  (1.753 m).   Weight as of this encounter: 79.9 kg.   DVT prophylaxis: scd.  Code Status: full code.  Family Communication: wife over phome Disposition Plan: remain in the hospital for IV fluids, antibiotics.   Consultants:   Nephrology  Hand surgeon.    Procedures:   none   Antimicrobials:  Vancomycin  cefepime   Subjective: Alert confuse. Right hand pain improved.   Objective: Vitals:   09/08/18 2042 09/09/18 0617 09/09/18 0619 09/09/18 1346  BP: (!) 176/81  (!) 157/56 (!) 152/63  Pulse: 73 74 73 76  Resp: 16 18 18 19   Temp: 98.6 F (37 C) 98 F (36.7 C)  98.1 F (36.7 C)  TempSrc: Oral Oral  Oral  SpO2: 99% 99% 97% 100%  Weight:      Height:        Intake/Output Summary (Last 24 hours) at 09/09/2018 1400 Last data filed at 09/09/2018 1330 Gross per 24 hour  Intake 1957.81 ml  Output 1425 ml  Net 532.81 ml   Filed Weights   09/05/18 2203 09/06/18 1623 09/07/18 0528  Weight: 77.1 kg 78.1 kg 79.9 kg    Examination:  General exam: alert , NAD Respiratory system: CTA Cardiovascular system: S 1, S 2 RRR Gastrointestinal system: BS present, soft, nt Central nervous system: Alert confuse Extremities: no edema Skin:  LE with open wound healing. Right hand with less edema and redness.    Data Reviewed: I have personally reviewed following labs and imaging studies  CBC: Recent Labs  Lab 09/05/18 2211 09/06/18 0357  09/07/18 0819 09/08/18 0414 09/09/18 0738  WBC 5.7 6.6 11.4* 12.6* 13.2*  HGB 9.0* 8.5* 8.9* 8.0* 7.1*  HCT 31.7* 30.6* 30.7* 27.6* 23.7*  MCV 100.0 105.9* 101.0* 101.5* 95.6  PLT 172 140* 133* 104* 96*   Basic Metabolic Panel: Recent Labs  Lab 09/05/18 2211 09/06/18 0356 09/07/18 0819 09/08/18 0414 09/09/18 0738  NA 148* 148* 147* 138 130*  K 5.2* 5.2* 4.6 4.5 3.9  CL 124* 127* 124* 115* 100  CO2 14* 11* 10* 13* 20*  GLUCOSE 216* 219* 213* 369* 229*  BUN 61* 56* 38* 35* 48*  CREATININE 2.56* 2.42* 2.28* 2.31* 2.09*  CALCIUM 10.0 9.5 9.1 8.5* 7.8*  PHOS  --  3.1  --   --   --    GFR: Estimated Creatinine Clearance: 32.4 mL/min (A) (by C-G formula based on SCr of 2.09 mg/dL (H)). Liver Function Tests: Recent Labs  Lab 09/05/18 2211 09/06/18 0356  AST 30  --   ALT 13  --   ALKPHOS 89  --   BILITOT 0.7  --   PROT 6.7  --   ALBUMIN 3.7 3.5   No results for input(s): LIPASE, AMYLASE in the last 168 hours. No results for input(s): AMMONIA in the last 168 hours. Coagulation Profile: No results for input(s): INR, PROTIME in the last 168 hours. Cardiac Enzymes: No results for input(s): CKTOTAL, CKMB, CKMBINDEX, TROPONINI in the last 168 hours. BNP (last 3 results) No results for input(s): PROBNP in the last 8760 hours. HbA1C: No results for input(s): HGBA1C in the last 72 hours. CBG: Recent Labs  Lab 09/08/18 1146 09/08/18 1644 09/08/18 2102 09/09/18 0739 09/09/18 1205  GLUCAP 398* 308* 229* 229* 242*   Lipid Profile: No results for input(s): CHOL, HDL, LDLCALC, TRIG, CHOLHDL, LDLDIRECT in the last 72 hours. Thyroid Function Tests: No results for input(s): TSH, T4TOTAL, FREET4, T3FREE, THYROIDAB in the last 72 hours. Anemia Panel: No results for input(s): VITAMINB12, FOLATE, FERRITIN, TIBC, IRON, RETICCTPCT in the last 72 hours. Sepsis Labs: Recent Labs  Lab 09/06/18 0132 09/06/18 0356 09/06/18 0406  PROCALCITON  --  0.15  --   LATICACIDVEN 1.07  --   1.29    Recent Results (from the past 240 hour(s))  Blood Culture (routine x 2)     Status: None (Preliminary result)   Collection Time: 09/06/18  1:22 AM  Result Value Ref Range Status   Specimen Description   Final    BLOOD LEFT FOREARM Performed at [facility], [place] [address] [place], [place] [address]    Special Requests   Final    BOTTLES DRAWN AEROBIC AND ANAEROBIC Blood Culture adequate volume Performed at [place] [address] [place], [place] [address]    Culture   Final    NO GROWTH 3 DAYS Performed at [facility], [place] [address], [place], [place] [address]    Report Status PENDING  Incomplete  Blood Culture (routine x 2)     Status: None (Preliminary  result)   Collection Time: 09/06/18  1:22 AM  Result Value Ref Range Status   Specimen Description   Final    BLOOD RIGHT ANTECUBITAL Performed at [facility] [address], [place], [place] [address]    Special Requests   Final    AEROBIC BOTTLE ONLY Blood Culture adequate volume Performed at [place] [place] [address], [place], [place] [address]    Culture   Final    NO GROWTH 3 DAYS Performed at [facility], [place] [address], [place], [place] [address]    Report Status PENDING  Incomplete  MRSA PCR Screening     Status: None   Collection Time: 09/07/18  8:45 AM  Result Value Ref Range Status   MRSA by PCR NEGATIVE NEGATIVE Final    Comment:        The GeneXpert MRSA Assay (FDA approved for NASAL specimens only), is one component of a comprehensive MRSA colonization surveillance program. It is not intended to diagnose MRSA infection nor to guide or monitor treatment for MRSA infections. Performed at [facility], [place] [address] [place], [place] [address]          Radiology Studies: No results found.      Scheduled Meds: . allopurinol  100 mg Oral Daily  . aspirin EC  81 mg Oral Daily   . atorvastatin  10 mg Oral Daily  . azithromycin  500 mg Oral Daily  . calcitRIOL  0.25 mcg Oral Once per day on Mon Wed Fri  . carvedilol  6.25 mg Oral BID  . colchicine  0.3 mg Oral Daily  . insulin aspart  0-15 Units Subcutaneous TID WC  . insulin aspart  3 Units Subcutaneous TID WC  . insulin glargine  15 Units Subcutaneous Daily  . latanoprost  1 drop Both Eyes QHS  . mycophenolate  500 mg Oral BID  . predniSONE  40 mg Oral Q breakfast  . sodium chloride flush  3 mL Intravenous Q12H  . tacrolimus  1 mg Oral BID   Continuous Infusions: . ceFEPime (MAXIPIME) IV 2 g (09/09/18 1126)  .  sodium bicarbonate (isotonic) infusion in sterile water 75 mL/hr at 09/09/18 0100     LOS: 2 days    Time spent: 35 minutes.     Elmarie Shiley, MD [facility] Pager [phone]  If 7PM-7AM, please contact night-coverage [email or web address] Password [email or web address] 09/09/2018, 2:00 PM

## 2018-09-09 NOTE — Evaluation (Signed)
Occupational Therapy Evaluation Patient Details Name: [name] MRN: [id number] DOB: 10-16-1945 Today's Date: 09/09/2018    History of Present Illness 72 yo male admitted to ED on 12/17 with confusion, lack of appetite. Pt with medical diagnosis of CAP with potential sepsis. 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    Clinical Impression   Pt admitted with sepsis. Pt currently with functional limitations due to the deficits listed below (see OT Problem List).  Pt will benefit from skilled OT to increase their safety and independence with ADL and functional mobility for ADL to facilitate discharge to venue listed below.      Follow Up Recommendations  SNF;Supervision/Assistance - 24 hour    Equipment Recommendations  None recommended by OT    Recommendations for Other Services       Precautions / Restrictions Precautions Precautions: Fall Restrictions Weight Bearing Restrictions: No      Mobility Bed Mobility               General bed mobility comments: pt OOB  Transfers Overall transfer level: Needs assistance Equipment used: Rolling walker (2 wheeled) Transfers: Sit to/from Stand Sit to Stand: Mod assist;+2 safety/equipment         General transfer comment: VC for hand placement    Balance Overall balance assessment: Needs assistance Sitting-balance support: No upper extremity supported Sitting balance-Leahy Scale: Good     Standing balance support: Bilateral upper extremity supported Standing balance-Leahy Scale: Poor                             ADL either performed or assessed with clinical judgement   ADL Overall ADL's : Needs assistance/impaired Eating/Feeding: Set up;Sitting   Grooming: Set up;Sitting   Upper Body Bathing: Minimal assistance;Sitting   Lower Body Bathing: Maximal assistance;Sit to/from  stand;Cueing for sequencing;Cueing for safety;+2 for safety/equipment   Upper Body Dressing : Minimal assistance;Sitting   Lower Body Dressing: Maximal assistance;Cueing for safety;Cueing for sequencing;Sit to/from stand;+2 for safety/equipment                        Extremity/Trunk Assessment Upper Extremity Assessment Upper Extremity Assessment: RUE deficits/detail;Generalized weakness RUE Deficits / Details: decreased strength R hand.  Encouraged pt to move R hand (fingers and wrist)  Pt agreed           Communication Communication Communication: Other (comment)(Pt difficult to understand at times. )   Cognition Arousal/Alertness: Awake/alert Behavior During Therapy: WFL for tasks assessed/performed;Impulsive Overall Cognitive Status: No family/caregiver present to determine baseline cognitive functioning                                 General Comments: pt follows commands appropriately during OT session but did move quickly 2 times in which OT reminded pt to slow for safety               Home Living Family/patient expects to be discharged to:: Private residence Living Arrangements: Spouse/significant other Available Help at Discharge: Family;Available PRN/intermittently Type of Home: House Home Access: Stairs to enter [employer] of Steps: unsure    Home Layout: One level     Bathroom Shower/Tub: Tub/shower unit         Home Equipment: None  Prior Functioning/Environment Level of Independence: Independent        Comments: Unsure of pt's home environment, no family present to determine this. Per RN, pt's wife states that pt is independent and active at home. 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         OT Problem List: Decreased strength;Decreased activity tolerance;Decreased safety awareness;Impaired balance (sitting and/or standing);Decreased knowledge of use of DME or AE      OT  Treatment/Interventions: Self-care/ADL training;Patient/family education;DME and/or AE instruction    OT Goals(Current goals can be found in the care plan section) Acute Rehab OT Goals Patient Stated Goal: did not state OT Goal Formulation: With patient Time For Goal Achievement: 09/16/18 Potential to Achieve Goals: Good ADL Goals Pt Will Perform Grooming: with supervision;standing Pt Will Perform Upper Body Dressing: with set-up;sitting Pt Will Perform Lower Body Dressing: with min guard assist;sit to/from stand Pt Will Transfer to Toilet: with supervision;ambulating Pt Will Perform Toileting - Clothing Manipulation and hygiene: with supervision;sit to/from stand  OT Frequency: Min 2X/week   Barriers to D/C: Decreased caregiver support          Co-evaluation              AM-PAC OT "6 Clicks" Daily Activity     Outcome Measure Help from another person eating meals?: A Little Help from another person taking care of personal grooming?: A Little Help from another person toileting, which includes using toliet, bedpan, or urinal?: A Lot Help from another person bathing (including washing, rinsing, drying)?: A Lot Help from another person to put on and taking off regular upper body clothing?: A Little Help from another person to put on and taking off regular lower body clothing?: A Lot 6 Click Score: 15   End of Session Equipment Utilized During Treatment: Rolling walker Nurse Communication: Mobility status  Activity Tolerance: Patient tolerated treatment well Patient left: in chair;with call bell/phone within reach;with nursing/sitter in room  OT Visit Diagnosis: Unsteadiness on feet (R26.81);Other abnormalities of gait and mobility (R26.89);Repeated falls (R29.6);Muscle weakness (generalized) (M62.81)                Time: 6659-9357 OT Time Calculation (min): 22 min Charges:  OT General Charges $OT Visit: 1 Visit OT Evaluation $OT Eval Moderate Complexity: 1 Mod  Kari Baars, OT Acute Rehabilitation Services [phone] Office- [phone], Edwena Felty D 09/09/2018, 5:09 PM

## 2018-09-09 NOTE — Progress Notes (Addendum)
[facility] Progress Note  Subjective: much more alert and oriented, much better today. Creat down 2.0  Vitals:   09/08/18 2042 09/09/18 0617 09/09/18 0619 09/09/18 1346  BP: (!) 176/81  (!) 157/56 (!) 152/63  Pulse: 73 74 73 76  Resp: 16 18 18 19   Temp: 98.6 F (37 C) 98 F (36.7 C)  98.1 F (36.7 C)  TempSrc: Oral Oral  Oral  SpO2: 99% 99% 97% 100%  Weight:      Height:        Inpatient medications: . allopurinol  100 mg Oral Daily  . aspirin EC  81 mg Oral Daily  . atorvastatin  10 mg Oral Daily  . azithromycin  500 mg Oral Daily  . calcitRIOL  0.25 mcg Oral Once per day on Mon Wed Fri  . carvedilol  6.25 mg Oral BID  . colchicine  0.3 mg Oral Daily  . insulin aspart  0-15 Units Subcutaneous TID WC  . insulin aspart  3 Units Subcutaneous TID WC  . insulin glargine  15 Units Subcutaneous Daily  . latanoprost  1 drop Both Eyes QHS  . mycophenolate  500 mg Oral BID  . predniSONE  40 mg Oral Q breakfast  . sodium bicarbonate  1,300 mg Oral BID  . sodium chloride flush  3 mL Intravenous Q12H  . tacrolimus  1 mg Oral BID   . ceFEPime (MAXIPIME) IV Stopped (09/09/18 1159)   acetaminophen, albuterol, gabapentin, HYDROcodone-acetaminophen, lip balm, morphine injection, ondansetron **OR** ondansetron (ZOFRAN) IV, polyvinyl alcohol, traZODone  Iron/TIBC/Ferritin/ %Sat No results found for: IRON, TIBC, FERRITIN, IRONPCTSAT  Exam: Gen alert, not in distress, confused/ disoriented , alert No rash, cyanosis or gangrene Sclera anicteric, throat dry  No jvd or bruits, flat neck veins Chest clear bilat to bases RRR no MRG Abd soft ntnd no mass or ascites +bs GU normal male w condom cath MS R post hand erythema resolved, still tender but swelling gone mostly Ext no LE edema, no wounds or ulcers Neuro is alert, NF    Home meds:  - carvedilol 6.25 bid/ furosemide 40 qd/ hydralazine 25 tid/ losartan 25 qd  - tacrolimus 1 bid/ prednisone 5 qd/ mycophenolate  500 bid  - linagliptin 5 qd  - atrovastatin 10/ aspirin 81  - gabapentin 100 prn/ prn's / vitamins   UA 12/18 > clear/ negative  CXR - questionable infiltrate/ atx R base    Impression/ Plan: 1. PNA - on IV abx, resolving 2. R hand pain - on Rx for acute gout, looks better 3. CKD / renal Tx - creat back to baseline 2.0 w/ IVF"s, was dehydrated. Will sign off.  4. Hypernatremia - resolved 5. AMS - resolved 6. DM2 on SSI 7. HTN - bp's good, OK to resume ARB now.  OK to resume lasix when he will be eating reg amounts again, prob about 2-3 days after dc.    Kelly Splinter MD [facility] pager [phone]   09/09/2018, 7:57 PM   Recent Labs  Lab 09/05/18 2211 09/06/18 0356  09/08/18 0414 09/09/18 0738  NA 148* 148*   < > 138 130*  K 5.2* 5.2*   < > 4.5 3.9  CL 124* 127*   < > 115* 100  CO2 14* 11*   < > 13* 20*  GLUCOSE 216* 219*   < > 369* 229*  BUN 61* 56*   < > 35* 48*  CREATININE 2.56* 2.42*   < > 2.31*  2.09*  CALCIUM 10.0 9.5   < > 8.5* 7.8*  PHOS  --  3.1  --   --   --   ALBUMIN 3.7 3.5  --   --   --    < > = values in this interval not displayed.   Recent Labs  Lab 09/05/18 2211  AST 30  ALT 13  ALKPHOS 89  BILITOT 0.7  PROT 6.7   Recent Labs  Lab 09/08/18 0414 09/09/18 0738  WBC 12.6* 13.2*  HGB 8.0* 7.1*  HCT 27.6* 23.7*  MCV 101.5* 95.6  PLT 104* 96*

## 2018-09-09 NOTE — Progress Notes (Signed)
Pt. CBG 374. No HS correction ordered. On call NP Bodenheimer paged and made aware of CBG. Orders changed to CBG ACHS with coverage. 5 units novolog given Subq. HS snack provided. Patient and family at bedside made aware of changes and this RN answered all questions. Will continue to monitor pt. Closely.

## 2018-09-10 LAB — CBC
HCT: 24.9 % — ABNORMAL LOW (ref 39.0–52.0)
HEMOGLOBIN: 7.3 g/dL — AB (ref 13.0–17.0)
MCH: 28 pg (ref 26.0–34.0)
MCHC: 29.3 g/dL — ABNORMAL LOW (ref 30.0–36.0)
MCV: 95.4 fL (ref 80.0–100.0)
NRBC: 0 % (ref 0.0–0.2)
Platelets: 107 10*3/uL — ABNORMAL LOW (ref 150–400)
RBC: 2.61 MIL/uL — ABNORMAL LOW (ref 4.22–5.81)
RDW: 13.4 % (ref 11.5–15.5)
WBC: 10.5 10*3/uL (ref 4.0–10.5)

## 2018-09-10 LAB — BASIC METABOLIC PANEL
Anion gap: 9 (ref 5–15)
BUN: 58 mg/dL — ABNORMAL HIGH (ref 8–23)
CO2: 18 mmol/L — ABNORMAL LOW (ref 22–32)
Calcium: 9 mg/dL (ref 8.9–10.3)
Chloride: 115 mmol/L — ABNORMAL HIGH (ref 98–111)
Creatinine, Ser: 2.13 mg/dL — ABNORMAL HIGH (ref 0.61–1.24)
GFR calc non Af Amer: 30 mL/min — ABNORMAL LOW (ref >60)
GFR, EST AFRICAN AMERICAN: 35 mL/min — AB (ref >60)
Glucose, Bld: 246 mg/dL — ABNORMAL HIGH (ref 70–99)
Potassium: 4.2 mmol/L (ref 3.5–5.1)
Sodium: 142 mmol/L (ref 135–145)

## 2018-09-10 LAB — IRON AND TIBC
Iron: 42 ug/dL — ABNORMAL LOW (ref 45–182)
Saturation Ratios: 23 % (ref 17.9–39.5)
TIBC: 179 ug/dL — ABNORMAL LOW (ref 250–450)
UIBC: 137 ug/dL

## 2018-09-10 LAB — GLUCOSE, CAPILLARY
Glucose-Capillary: 216 mg/dL — ABNORMAL HIGH (ref 70–99)
Glucose-Capillary: 313 mg/dL — ABNORMAL HIGH (ref 70–99)
Glucose-Capillary: 187 mg/dL — ABNORMAL HIGH (ref 70–99)
Glucose-Capillary: 247 mg/dL — ABNORMAL HIGH (ref 70–99)

## 2018-09-10 LAB — FERRITIN: Ferritin: 366 ng/mL — ABNORMAL HIGH (ref 24–336)

## 2018-09-10 LAB — VITAMIN B12: Vitamin B-12: 1040 pg/mL — ABNORMAL HIGH (ref 180–914)

## 2018-09-10 MED ORDER — INSULIN GLARGINE 100 UNIT/ML ~~LOC~~ SOLN
18.0000 [IU] | Freq: Every day | SUBCUTANEOUS | Status: DC
  Administered 2018-09-11: 18 [IU] via SUBCUTANEOUS
  Filled 2018-09-10: qty 0.18

## 2018-09-10 MED ORDER — AMOXICILLIN-POT CLAVULANATE 875-125 MG PO TABS
1.0000 | ORAL_TABLET | Freq: Two times a day (BID) | ORAL | Status: DC
  Administered 2018-09-10 – 2018-09-11 (×3): 1 via ORAL
  Filled 2018-09-10 (×3): qty 1

## 2018-09-10 MED ORDER — SODIUM CHLORIDE 0.9 % IV SOLN
510.0000 mg | Freq: Once | INTRAVENOUS | Status: AC
  Administered 2018-09-10: 510 mg via INTRAVENOUS
  Filled 2018-09-10: qty 17

## 2018-09-10 MED ORDER — SODIUM BICARBONATE 650 MG PO TABS
1300.0000 mg | ORAL_TABLET | Freq: Three times a day (TID) | ORAL | Status: DC
  Administered 2018-09-10 – 2018-09-11 (×5): 1300 mg via ORAL
  Filled 2018-09-10 (×5): qty 2

## 2018-09-10 MED ORDER — PREDNISONE 20 MG PO TABS
30.0000 mg | ORAL_TABLET | Freq: Every day | ORAL | Status: DC
  Administered 2018-09-11: 30 mg via ORAL
  Filled 2018-09-10: qty 1

## 2018-09-10 NOTE — Progress Notes (Signed)
PROGRESS NOTE    [name]  MVE:[id number] DOB: 1946/07/20 DOA: 09/05/2018 PCP: [name], MD    Brief Narrative: 72 year old with past medical history significant for paroxysmal A. fib, diabetes type 2, status post renal transplant 2005, hypertension who presents with altered mental status.  Wife provide history, she relate that patient has been having a cough for the last week, accompanied with fever of 102.  Evaluation in the emergency department patient was found to have acute on chronic renal failure metabolic acidosis, febrile.  Chest x-ray showing right lower lobe infiltrate versus atelectasis.  Assessment & Plan:   Principal Problem:   Acute metabolic encephalopathy Active Problems:   Kidney transplant status, cadaveric   Immunosuppressed status ([place])   Anemia due to chronic kidney disease   Diabetes mellitus type 2, insulin dependent ([place])   Community acquired pneumonia   1-Acute metabolic encephalopathy; Patient is still confused, more alert. Suspect related to infection and metabolic abnormality. More alert, not oriented to place.  Improved.   2-Sepsis; could be related to pneumonia Patient on immunosuppressive medications.  Continue with IV fluids. Stop vancomycin, continue with cefepime.  WBC at 10. Change cefepime to Augmentin.   3-Metabolic acidosis, bicarb at 10.  Continue with  IV bicarb gtt.  Treated with bicarb gtt/  bicarb at 75. Will increase sodium bicarb tablet to TID>   4-AKI on chronic Kidney diseases III  History of renal transplant. Cr six month ago 2.0.  Tacrolimus level pending.  Nephrology consulted, appreciate Dr Melvia Heaps help.  Cr at baseline. Resume lasix 2 days after  discharge/  5-Right had swelling, pain, Gout Ortho consulted. Appreciate Dr Theda Sers help. Treat for gout  Continue with allopurinol, started colchicine renal dose.  Change prednisone to 30 mg daily  Improving.   A flutter; continue with carvedilol.    Hyponatremia; resolved.   DM type 2; hyperglycemia;  SSI.  Continue with  lantus,  18 units.  Meals coverage.   HTN; hold hydralazine.   Thrombocytopenia; follow trend. Stop heparin/   Anemia; likely of chronic diseases, follow trend.  Hb at 7.  Repeat in am.  Received aranesp 12-11 Will give IV iron.   RN Pressure Injury Documentation:    Malnutrition Type:      Malnutrition Characteristics:      Nutrition Interventions:     Estimated body mass index is 27.51 kg/m as calculated from the following:   Height as of this encounter: 5\' 9"  (1.753 m).   Weight as of this encounter: 84.5 kg.   DVT prophylaxis: scd.  Code Status: full code.  Family Communication: wife over phome Disposition Plan: will ask PT to re-evaluate   Consultants:   Nephrology  Hand surgeon.    Procedures:   none   Antimicrobials:  Vancomycin  cefepime   Subjective: Alert, less confuse.    Objective: Vitals:   09/09/18 0619 09/09/18 1346 09/09/18 2100 09/10/18 0534  BP: (!) 157/56 (!) 152/63 120/81 (!) 148/82  Pulse: 73 76 74 75  Resp: 18 19 18 14   Temp:  98.1 F (36.7 C) 98.2 F (36.8 C) 98.3 F (36.8 C)  TempSrc:  Oral Oral Oral  SpO2: 97% 100% 98% 98%  Weight:    84.5 kg  Height:        Intake/Output Summary (Last 24 hours) at 09/10/2018 1318 Last data filed at 09/10/2018 0553 Gross per 24 hour  Intake 1160.07 ml  Output 1450 ml  Net -289.93 ml   [employer]  09/06/18 1623 09/07/18 0528 09/10/18 0534  Weight: 78.1 kg 79.9 kg 84.5 kg    Examination:  General exam: NAD Respiratory system: CTA Cardiovascular system: S 1, S 2 RRR Gastrointestinal system: BS present, soft, nt Central nervous system: alert.  Extremities: No edema Skin:  LE with open wound healing. Right hand no edema   Data Reviewed: I have personally reviewed following labs and imaging studies  CBC: Recent Labs  Lab 09/06/18 0357 09/07/18 0819 09/08/18 0414  09/09/18 0738 09/10/18 0505  WBC 6.6 11.4* 12.6* 13.2* 10.5  HGB 8.5* 8.9* 8.0* 7.1* 7.3*  HCT 30.6* 30.7* 27.6* 23.7* 24.9*  MCV 105.9* 101.0* 101.5* 95.6 95.4  PLT 140* 133* 104* 96* 409*   Basic Metabolic Panel: Recent Labs  Lab 09/06/18 0356 09/07/18 0819 09/08/18 0414 09/09/18 0738 09/10/18 0505  NA 148* 147* 138 130* 142  K 5.2* 4.6 4.5 3.9 4.2  CL 127* 124* 115* 100 115*  CO2 11* 10* 13* 20* 18*  GLUCOSE 219* 213* 369* 229* 246*  BUN 56* 38* 35* 48* 58*  CREATININE 2.42* 2.28* 2.31* 2.09* 2.13*  CALCIUM 9.5 9.1 8.5* 7.8* 9.0  PHOS 3.1  --   --   --   --    GFR: Estimated Creatinine Clearance: 31.8 mL/min (A) (by C-G formula based on SCr of 2.13 mg/dL (H)). Liver Function Tests: Recent Labs  Lab 09/05/18 2211 09/06/18 0356  AST 30  --   ALT 13  --   ALKPHOS 89  --   BILITOT 0.7  --   PROT 6.7  --   ALBUMIN 3.7 3.5   No results for input(s): LIPASE, AMYLASE in the last 168 hours. No results for input(s): AMMONIA in the last 168 hours. Coagulation Profile: No results for input(s): INR, PROTIME in the last 168 hours. Cardiac Enzymes: No results for input(s): CKTOTAL, CKMB, CKMBINDEX, TROPONINI in the last 168 hours. BNP (last 3 results) No results for input(s): PROBNP in the last 8760 hours. HbA1C: No results for input(s): HGBA1C in the last 72 hours. CBG: Recent Labs  Lab 09/09/18 1205 09/09/18 1652 09/09/18 2115 09/10/18 0738 09/10/18 1239  GLUCAP 242* 312* 374* 216* 313*   Lipid Profile: No results for input(s): CHOL, HDL, LDLCALC, TRIG, CHOLHDL, LDLDIRECT in the last 72 hours. Thyroid Function Tests: No results for input(s): TSH, T4TOTAL, FREET4, T3FREE, THYROIDAB in the last 72 hours. Anemia Panel: Recent Labs    09/10/18 0505  VITAMINB12 1,040*  FERRITIN 366*  TIBC 179*  IRON 42*   Sepsis Labs: Recent Labs  Lab 09/06/18 0132 09/06/18 0356 09/06/18 0406  PROCALCITON  --  0.15  --   LATICACIDVEN 1.07  --  1.29    Recent  Results (from the past 240 hour(s))  Blood Culture (routine x 2)     Status: None (Preliminary result)   Collection Time: 09/06/18  1:22 AM  Result Value Ref Range Status   Specimen Description   Final    BLOOD LEFT FOREARM Performed at [facility], [place] [address], [place], [place] [address]    Special Requests   Final    BOTTLES DRAWN AEROBIC AND ANAEROBIC Blood Culture adequate volume Performed at [facility] [address] [place], [place] [address]    Culture   Final    NO GROWTH 4 DAYS Performed at [facility], [place] [address] [place],  [address]    Report Status PENDING  Incomplete  Blood Culture (routine x 2)  Status: None (Preliminary result)   Collection Time: 09/06/18  1:22 AM  Result Value Ref Range Status   Specimen Description   Final    BLOOD RIGHT ANTECUBITAL Performed at [facility] [address], [place], [place] [address]    Special Requests   Final    AEROBIC BOTTLE ONLY Blood Culture adequate volume Performed at [facility] [address] [place], [place] [address]    Culture   Final    NO GROWTH 4 DAYS Performed at [facility], [place] [address], [place], [place] [address]    Report Status PENDING  Incomplete  MRSA PCR Screening     Status: None   Collection Time: 09/07/18  8:45 AM  Result Value Ref Range Status   MRSA by PCR NEGATIVE NEGATIVE Final    Comment:        The GeneXpert MRSA Assay (FDA approved for NASAL specimens only), is one component of a comprehensive MRSA colonization surveillance program. It is not intended to diagnose MRSA infection nor to guide or monitor treatment for MRSA infections. Performed at [facility], [place] [address] [place],  [address]          Radiology Studies: No results found.      Scheduled Meds: . allopurinol  100 mg Oral Daily  . aspirin EC  81 mg Oral Daily  . atorvastatin   10 mg Oral Daily  . azithromycin  500 mg Oral Daily  . calcitRIOL  0.25 mcg Oral Once per day on Mon Wed Fri  . carvedilol  6.25 mg Oral BID  . colchicine  0.3 mg Oral Daily  . insulin aspart  0-15 Units Subcutaneous TID WC  . insulin aspart  0-5 Units Subcutaneous QHS  . insulin aspart  3 Units Subcutaneous TID WC  . insulin glargine  15 Units Subcutaneous Daily  . latanoprost  1 drop Both Eyes QHS  . mycophenolate  500 mg Oral BID  . [START ON 09/11/2018] predniSONE  30 mg Oral Q breakfast  . sodium bicarbonate  1,300 mg Oral TID  . sodium chloride flush  3 mL Intravenous Q12H  . tacrolimus  1 mg Oral BID   Continuous Infusions: . ceFEPime (MAXIPIME) IV 2 g (09/10/18 0947)     LOS: 3 days    Time spent: 35 minutes.     Elmarie Shiley, MD [facility] Pager [phone]  If 7PM-7AM, please contact night-coverage [email or web address] Password [email or web address] 09/10/2018, 1:18 PM
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
## 2018-09-10 NOTE — Plan of Care (Signed)
Pt had a good appetite this shift. No pain reported by pt.

## 2018-09-10 NOTE — Clinical Social Work Note (Signed)
Clinical Social Work Assessment  Patient Details  Name: [name] MRN: [id number] Date of Birth: [birth year]  Date of referral:  09/09/18               Reason for consult:  Facility Placement                Permission sought to share information with:  Facility Art therapist granted to share information::  Yes, Verbal Permission Granted  Name::     [name]  Agency::  SNF  Relationship::  Spouse  Contact Information:  [phone]  Housing/Transportation Living arrangements for the past 2 months:  [place] of Information:  Patient Patient Interpreter Needed:  None Criminal Activity/Legal Involvement Pertinent to Current Situation/Hospitalization:  No - Comment as needed Significant Relationships:  Spouse, [employer] Lives with:  Spouse Do you feel safe going back to the place where you live?  Yes Need for family participation in patient care:  Yes (Comment)  Care giving concerns:  Patient admitted with confusion, lack of appetite. Pt with medical diagnosis of CAP with potential sepsis. PT recommending short term therapy at SNF before returning home.   Social Worker assessment / plan:  CSW met with patient at bedside to discuss d/c plans. Patient had guests visiting from his church. CSW explained role and PT recommendation for SNF at d/c.   Patient agreeable to CSW making referrals, although his understanding to fluctuate. Patient asked clarifying questions regarding LOS and rehab process - CSW answered. Acknowledge that patient is Ox3 and will discuss d/c plans with wife, [name].  No noted facility preferences. Patient gave permission for CSW to begin SNF referral process. CSW will complete FL2 and send out referrals.    Employment status:    Insurance information:  Medicare PT Recommendations:  [place] / Referral to community resources:  [place]  Patient/Family's Response to care:   Patient was pleasant and appreciate of CSW answering questions and explaining SNF process.  Patient/Family's Understanding of and Emotional Response to Diagnosis, Current Treatment, and Prognosis:  Unclear if patient fully understood SNF recommendation - will f/u with spouse, [name].  Emotional Assessment Appearance:  Appears stated age Attitude/Demeanor/Rapport:    Affect (typically observed):  Accepting, Pleasant Orientation:  Oriented to Self, Oriented to  Time, Oriented to Place Alcohol / Substance use:  Not Applicable Psych involvement (Current and /or in the community):  No (Comment)  Discharge Needs  Concerns to be addressed:  Care Coordination Readmission within the last 30 days:  No Current discharge risk:  Physical Impairment Barriers to Discharge:  Continued Medical Work up   [employer], [place] 09/10/2018, 2:20 PM

## 2018-09-11 ENCOUNTER — Ambulatory Visit: Payer: Medicare Other | Admitting: Physician Assistant

## 2018-09-11 LAB — GLUCOSE, CAPILLARY
Glucose-Capillary: 206 mg/dL — ABNORMAL HIGH (ref 70–99)
Glucose-Capillary: 226 mg/dL — ABNORMAL HIGH (ref 70–99)
Glucose-Capillary: 166 mg/dL — ABNORMAL HIGH (ref 70–99)
Glucose-Capillary: 113 mg/dL — ABNORMAL HIGH (ref 70–99)

## 2018-09-11 LAB — BASIC METABOLIC PANEL
Anion gap: 11 (ref 5–15)
BUN: 44 mg/dL — ABNORMAL HIGH (ref 8–23)
CO2: 18 mmol/L — ABNORMAL LOW (ref 22–32)
Calcium: 8.9 mg/dL (ref 8.9–10.3)
Chloride: 111 mmol/L (ref 98–111)
Creatinine, Ser: 1.8 mg/dL — ABNORMAL HIGH (ref 0.61–1.24)
GFR calc Af Amer: 43 mL/min — ABNORMAL LOW (ref >60)
GFR calc non Af Amer: 37 mL/min — ABNORMAL LOW (ref >60)
GLUCOSE: 237 mg/dL — AB (ref 70–99)
Potassium: 3.9 mmol/L (ref 3.5–5.1)
Sodium: 140 mmol/L (ref 135–145)

## 2018-09-11 LAB — CULTURE, BLOOD (ROUTINE X 2)
Culture: NO GROWTH
Culture: NO GROWTH
Special Requests: ADEQUATE
Special Requests: ADEQUATE

## 2018-09-11 LAB — HEMOGLOBIN AND HEMATOCRIT, BLOOD
HCT: 26 % — ABNORMAL LOW (ref 39.0–52.0)
Hemoglobin: 7.7 g/dL — ABNORMAL LOW (ref 13.0–17.0)

## 2018-09-11 MED ORDER — AMOXICILLIN-POT CLAVULANATE 875-125 MG PO TABS: 1.0000 | ORAL_TABLET | Freq: Two times a day (BID) | ORAL | 0 refills | Status: DC

## 2018-09-11 MED ORDER — DIPHENHYDRAMINE HCL 25 MG PO CAPS: 25.0000 mg | ORAL_CAPSULE | Freq: Every evening | ORAL | 0 refills | Status: DC | PRN

## 2018-09-11 MED ORDER — COLCHICINE 0.6 MG PO TABS: 0.3000 mg | ORAL_TABLET | Freq: Every day | ORAL | 0 refills | Status: DC

## 2018-09-11 MED ORDER — ALLOPURINOL 100 MG PO TABS: 100.0000 mg | ORAL_TABLET | Freq: Every day | ORAL | 0 refills | Status: DC

## 2018-09-11 MED ORDER — SODIUM BICARBONATE 650 MG PO TABS: 1300.0000 mg | ORAL_TABLET | Freq: Three times a day (TID) | ORAL | 0 refills | Status: DC

## 2018-09-11 NOTE — Discharge Summary (Signed)
Physician Discharge Summary  [name] GDJ:[id number] DOB: 04/17/1946 DOA: 09/05/2018  PCP: [name], MD  Admit date: 09/05/2018 Discharge date: 09/11/2018  Admitted From: Home  Disposition: Home   Recommendations for Outpatient Follow-up:  1. Follow up with PCP in 1-2 weeks 2. Please obtain BMP/CBC in one week 3. Needs further aranesp injection.   Home Health; yes , HH, PT, OT , RN  Discharge Condition; stable.  CODE STATUS: full code.  Diet recommendation: Heart Healthy   Brief/Interim Summary: Brief Narrative: 72 year old with past medical history significant for paroxysmal A. fib, diabetes type 2, status post renal transplant 2005, hypertension who presents with altered mental status.  Wife provide history, she relate that patient has been having a cough for the last week, accompanied with fever of 102.  Evaluation in the emergency department patient was found to have acute on chronic renal failure metabolic acidosis, febrile.  Chest x-ray showing right lower lobe infiltrate versus atelectasis.  Assessment & Plan:   Principal Problem:   Acute metabolic encephalopathy Active Problems:   Kidney transplant status, cadaveric   Immunosuppressed status ([place])   Anemia due to chronic kidney disease   Diabetes mellitus type 2, insulin dependent ([place])   Community acquired pneumonia   1-Acute metabolic encephalopathy; Patient is still confused, more alert. Suspect related to infection and metabolic abnormality. More alert, not oriented to place.  Improved.   2-Sepsis; could be related to pneumonia Patient on immunosuppressive medications.  Continue with IV fluids. Stop vancomycin, continue with cefepime.  WBC at 10. Change cefepime to Augmentin. needs 2 more days.   3-Metabolic acidosis, bicarb at 10.  Continue with  IV bicarb gtt.  Treated with bicarb gtt/  bicarb at 38. Increased sodium bicarb tablet to TID>   4-AKI on chronic Kidney diseases  III  History of renal transplant. Cr six month ago 2.0.  Tacrolimus level pending.  Nephrology consulted, appreciate Dr Melvia Heaps help.  Cr at baseline. Resume lasix 2 days after  discharge/ resume lasix at discharge.   5-Right had swelling, pain, Gout Ortho consulted. Appreciate Dr Theda Sers help. Treat for gout  Continue with allopurinol, started colchicine renal dose.  Change prednisone to 30 mg daily resume home dose prednisone at discharge.  Improving.   A flutter; continue with carvedilol.   Hyponatremia; resolved.   DM type 2; hyperglycemia;  SSI.  Continue with  lantus. Resume others meds at discharge  Meals coverage.   HTN; hold hydralazine.   Thrombocytopenia; follow trend. Stop heparin/   Anemia; likely of chronic diseases, follow trend.  Hb at 7.  Hb increase to 7.7 Received aranesp 12-11 Received IV iron.   Discharge Diagnoses:  Principal Problem:   Acute metabolic encephalopathy Active Problems:   Kidney transplant status, cadaveric   Immunosuppressed status ([place])   Anemia due to chronic kidney disease   Diabetes mellitus type 2, insulin dependent ([place])   Community acquired pneumonia    Discharge Instructions  Discharge Instructions    Diet - low sodium heart healthy   Complete by:  As directed    Diet - low sodium heart healthy   Complete by:  As directed    Increase activity slowly   Complete by:  As directed    Increase activity slowly   Complete by:  As directed      Allergies as of 09/11/2018      Reactions   Elavil [amitriptyline Hcl] Other (See Comments)   Unknown felt as if he was tripping  Medication List    STOP taking these medications   HYDROcodone-acetaminophen 5-325 MG tablet Commonly known as:  NORCO/VICODIN   losartan 25 MG tablet Commonly known as:  COZAAR     TAKE these medications   acetaminophen 500 MG tablet Commonly known as:  TYLENOL Take 1,000 mg by mouth every 8 (eight) hours as needed for  moderate pain.   allopurinol 100 MG tablet Commonly known as:  ZYLOPRIM Take 1 tablet (100 mg total) by mouth daily. Start taking on:  September 12, 2018   amoxicillin-clavulanate 875-125 MG tablet Commonly known as:  AUGMENTIN Take 1 tablet by mouth every 12 (twelve) hours.   aspirin 81 MG tablet Take 81 mg by mouth daily.   atorvastatin 10 MG tablet Commonly known as:  LIPITOR Take 10 mg by mouth daily.   B-D SINGLE USE SWABS REGULAR Pads Use as directed   BAYER CONTOUR NEXT TEST test strip Generic drug:  glucose blood Use as directed   BD PEN NEEDLE NANO U/F 32G X 4 MM Misc Generic drug:  Insulin Pen Needle Use as directed   calcitRIOL 0.25 MCG capsule Commonly known as:  ROCALTROL Take 0.25 mcg by mouth 3 (three) times a week. (Mondays, Wednesdays, and Fridays)   carvedilol 6.25 MG tablet Commonly known as:  COREG Take 1 tablet (6.25 mg total) by mouth 2 (two) times daily.   colchicine 0.6 MG tablet Take 0.5 tablets (0.3 mg total) by mouth daily. Start taking on:  September 12, 2018   diphenhydrAMINE 25 mg capsule Commonly known as:  BENADRYL Take 1 capsule (25 mg total) by mouth at bedtime as needed (for sleep/allergies.).   furosemide 40 MG tablet Commonly known as:  LASIX Take 1 tablet (40 mg total) by mouth daily. Fluid.   gabapentin 100 MG capsule Commonly known as:  NEURONTIN Take 100 mg by mouth 3 (three) times daily as needed (for nerve pain). Take three times/day as needed   HUMALOG KWIKPEN 100 UNIT/ML KwikPen Generic drug:  insulin lispro Inject 3-5 Units into the skin 3 (three) times daily.   hydrALAZINE 25 MG tablet Commonly known as:  APRESOLINE Take 25 mg by mouth 3 (three) times daily.   LANTUS SOLOSTAR 100 UNIT/ML Solostar Pen Generic drug:  Insulin Glargine Inject 7-15 Units into the skin daily. If blood sugars are >300, use 15 units, <300 use 7 units   latanoprost 0.005 % ophthalmic solution Commonly known as:  XALATAN Place 1  drop into both eyes at bedtime.   MICROLET LANCETS Misc Use as directed   mycophenolate 500 MG tablet Commonly known as:  CELLCEPT Take 500 mg by mouth 2 (two) times daily.   predniSONE 5 MG tablet Commonly known as:  DELTASONE Take 5 mg by mouth daily.   sodium bicarbonate 650 MG tablet Take 2 tablets (1,300 mg total) by mouth 3 (three) times daily.   tacrolimus 1 MG capsule Commonly known as:  PROGRAF Take 1 mg by mouth 2 (two) times daily.   TRADJENTA 5 MG Tabs tablet Generic drug:  linagliptin Take 5 mg by mouth daily.   VASCEPA 1 g Caps Generic drug:  Icosapent Ethyl Take 4 g by mouth daily.            Durable Medical Equipment  (From admission, onward)         Start     Ordered   09/11/18 1010  For home use only DME Walker  Once    Question:  Patient needs a  walker to treat with the following condition  Answer:  Balance disorder   09/11/18 1009         Follow-up Information    [name], MD Follow up in 1 week(s).   Specialty:  Nephrology Contact information: [facility] [address] [place] Follow up.   Why:  rolling walker Contact information: [address] [place] [address] [phone]        [facility], [facility] Follow up.   Specialty:  Home Health Services Why:  [facility],nursing,occupational therapy Contact information: [place] [address] [phone]          Allergies  Allergen Reactions  . Elavil [Amitriptyline Hcl] Other (See Comments)    Unknown felt as if he was tripping    Consultations:  Nephrology    Procedures/Studies: Dg Chest 2 View  Result Date: 09/06/2018 CLINICAL DATA:  Cough.  Altered mental status. EXAM: CHEST - 2 VIEW COMPARISON:  Chest x-ray dated December 11, 2015. FINDINGS: The heart remains at the upper limits of normal in size. Mild pulmonary vascular congestion. Mild patchy  density in the medial right lower lobe. No pleural effusion or pneumothorax. No acute osseous abnormality. IMPRESSION: 1. Right lower lobe atelectasis versus infiltrate. 2. Mild pulmonary vascular congestion. Electronically Signed   By: Titus Dubin M.D.   On: 09/06/2018 00:42   Ct Head Wo Contrast  Result Date: 09/06/2018 CLINICAL DATA:  Altered mental status. EXAM: CT HEAD WITHOUT CONTRAST TECHNIQUE: Contiguous axial images were obtained from the base of the skull through the vertex without intravenous contrast. COMPARISON:  None. FINDINGS: Despite efforts by the technologist and patient, motion artifact is present on today's exam and could not be eliminated. This reduces exam sensitivity and specificity. Brain: No evidence of acute infarction, hemorrhage, hydrocephalus, extra-axial collection or mass lesion/mass effect. Mild to moderate age related cerebral atrophy. Vascular: Calcified atherosclerosis at the skullbase. No hyperdense vessel. Skull: Normal. Negative for fracture or focal lesion. Sinuses/Orbits: No acute finding. Other: None. IMPRESSION: 1. Motion limited study. No definite acute intracranial abnormality. Electronically Signed   By: Titus Dubin M.D.   On: 09/06/2018 01:41   Dg Hand 2 View Right  Result Date: 09/07/2018 CLINICAL DATA:  Hand pain.  Possible gout EXAM: RIGHT HAND - 2 VIEW COMPARISON:  04/18/2017 FINDINGS: Limited study. Contractures of the fingers. Motion degraded image on the lateral view. Negative for fracture. Cartilage loss and erosions of the first MCP joint unchanged. Cartilage loss and irregularity of the third MCP joint. These are stable from the prior study. Extensive arterial calcification IMPRESSION: Cartilage loss and subchondral irregularity of the second and third MCP joints unchanged. This is not a typical location for gout. 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 Electronically Signed   By: Franchot Gallo M.D.   On: 09/07/2018 13:46       Subjective: He is doing better. Right hand pain almost gone. His confusion has improved 75 % per family.  He denies pain.    Discharge Exam: Vitals:   09/10/18 2313 09/11/18 0626  BP: (!) 146/70 (!) 169/85  Pulse:  76  Resp:  16  Temp:  98.4 F (36.9 C)  SpO2:  94%   Vitals:   09/10/18 1624 09/10/18 2252 09/10/18 2313 09/11/18 0626  BP: (!) 166/88  (!) 146/70 (!) 169/85  Pulse: 77 78  76  Resp: 20 12  16  Temp: 98.9 F (37.2 C) 98.5 F (36.9 C)  98.4 F (36.9 C)  TempSrc: Oral Oral  Oral  SpO2: 98% 96%  94%  Weight:    80.4 kg  Height:        General: Pt is alert, awake, not in acute distress Cardiovascular: RRR, S1/S2 +, no rubs, no gallops Respiratory: CTA bilaterally, no wheezing, no rhonchi Abdominal: Soft, NT, ND, bowel sounds + Extremities: no edema, no cyanosis    The results of significant diagnostics from this hospitalization (including imaging, microbiology, ancillary and laboratory) are listed below for reference.     Microbiology: Recent Results (from the past 240 hour(s))  Blood Culture (routine x 2)     Status: None   Collection Time: 09/06/18  1:22 AM  Result Value Ref Range Status   Specimen Description   Final    BLOOD LEFT FOREARM Performed at [facility], [address], [place], [place] [address]    Special Requests   Final    BOTTLES DRAWN AEROBIC AND ANAEROBIC Blood Culture adequate volume Performed at [facility] [address] [place], [place] [address]    Culture   Final    NO GROWTH 5 DAYS Performed at [facility], [place] [address], [place], [place] [address]    Report Status 09/11/2018 FINAL  Final  Blood Culture (routine x 2)     Status: None   Collection Time: 09/06/18  1:22 AM  Result Value Ref Range Status   Specimen Description   Final    BLOOD RIGHT ANTECUBITAL Performed at [facility] [address], [place], [place] [address]    Special Requests   Final     AEROBIC BOTTLE ONLY Blood Culture adequate volume Performed at [facility] [address], [place], [place] [address]    Culture   Final    NO GROWTH 5 DAYS Performed at [facility], [place] [address], [place], [place] [address]    Report Status 09/11/2018 FINAL  Final  MRSA PCR Screening     Status: None   Collection Time: 09/07/18  8:45 AM  Result Value Ref Range Status   MRSA by PCR NEGATIVE NEGATIVE Final    Comment:        The GeneXpert MRSA Assay (FDA approved for NASAL specimens only), is one component of a comprehensive MRSA colonization surveillance program. It is not intended to diagnose MRSA infection nor to guide or monitor treatment for MRSA infections. Performed at [facility], [place] [address], [place], [place] [address]      Labs: BNP (last 3 results) No results for input(s): BNP in the last 8760 hours. Basic Metabolic Panel: Recent Labs  Lab 09/06/18 0356 09/07/18 0819 09/08/18 0414 09/09/18 0738 09/10/18 0505 09/11/18 0406  NA 148* 147* 138 130* 142 140  K 5.2* 4.6 4.5 3.9 4.2 3.9  CL 127* 124* 115* 100 115* 111  CO2 11* 10* 13* 20* 18* 18*  GLUCOSE 219* 213* 369* 229* 246* 237*  BUN 56* 38* 35* 48* 58* 44*  CREATININE 2.42* 2.28* 2.31* 2.09* 2.13* 1.80*  CALCIUM 9.5 9.1 8.5* 7.8* 9.0 8.9  PHOS 3.1  --   --   --   --   --    Liver Function Tests: Recent Labs  Lab 09/05/18 2211 09/06/18 0356  AST 30  --   ALT 13  --   ALKPHOS 89  --   BILITOT  0.7  --   PROT 6.7  --   ALBUMIN 3.7 3.5   No results for input(s): LIPASE, AMYLASE in the last 168 hours. No results for input(s): AMMONIA in the last 168 hours. CBC: Recent Labs  Lab 09/06/18 0357 09/07/18 0819 09/08/18 0414 09/09/18 0738 09/10/18 0505 09/11/18 0406  WBC 6.6 11.4* 12.6* 13.2* 10.5  --   HGB 8.5* 8.9* 8.0* 7.1* 7.3* 7.7*  HCT 30.6* 30.7* 27.6* 23.7* 24.9* 26.0*  MCV 105.9* 101.0* 101.5* 95.6 95.4  --   PLT 140* 133* 104*  96* 107*  --    Cardiac Enzymes: No results for input(s): CKTOTAL, CKMB, CKMBINDEX, TROPONINI in the last 168 hours. BNP: Invalid input(s): POCBNP CBG: Recent Labs  Lab 09/10/18 1239 09/10/18 1643 09/10/18 2205 09/11/18 0737 09/11/18 1140  GLUCAP 313* 187* 247* 206* 226*   D-Dimer No results for input(s): DDIMER in the last 72 hours. Hgb A1c No results for input(s): HGBA1C in the last 72 hours. Lipid Profile No results for input(s): CHOL, HDL, LDLCALC, TRIG, CHOLHDL, LDLDIRECT in the last 72 hours. Thyroid function studies No results for input(s): TSH, T4TOTAL, T3FREE, THYROIDAB in the last 72 hours.  Invalid input(s): FREET3 Anemia work up Recent Labs    09/10/18 0505  VITAMINB12 1,040*  FERRITIN 366*  TIBC 179*  IRON 42*   Urinalysis    Component Value Date/Time   COLORURINE YELLOW 09/06/2018 0016   APPEARANCEUR CLEAR 09/06/2018 0016   LABSPEC 1.011 09/06/2018 0016   PHURINE 5.0 09/06/2018 0016   GLUCOSEU NEGATIVE 09/06/2018 0016   HGBUR SMALL (A) 09/06/2018 0016   BILIRUBINUR NEGATIVE 09/06/2018 0016   KETONESUR 5 (A) 09/06/2018 0016   PROTEINUR 30 (A) 09/06/2018 0016   NITRITE NEGATIVE 09/06/2018 0016   LEUKOCYTESUR NEGATIVE 09/06/2018 0016   Sepsis Labs Invalid input(s): PROCALCITONIN,  WBC,  LACTICIDVEN Microbiology Recent Results (from the past 240 hour(s))  Blood Culture (routine x 2)     Status: None   Collection Time: 09/06/18  1:22 AM  Result Value Ref Range Status   Specimen Description   Final    BLOOD LEFT FOREARM Performed at [facility], [place] [address] [place], [place] [address]    Special Requests   Final    BOTTLES DRAWN AEROBIC AND ANAEROBIC Blood Culture adequate volume Performed at [facility] [address], [place], [place] [address]    Culture   Final    NO GROWTH 5 DAYS Performed at [facility], [place] [address] [place], [place] [address]    Report Status 09/11/2018 FINAL  Final  Blood  Culture (routine x 2)     Status: None   Collection Time: 09/06/18  1:22 AM  Result Value Ref Range Status   Specimen Description   Final    BLOOD RIGHT ANTECUBITAL Performed at [facility] [address] [place], [place] [address]    Special Requests   Final    AEROBIC BOTTLE ONLY Blood Culture adequate volume Performed at [facility] [address], [place], [place] [address]    Culture   Final    NO GROWTH 5 DAYS Performed at [facility], [place] [address], [place],  [address]    Report Status 09/11/2018 FINAL  Final  MRSA PCR Screening     Status: None   Collection Time: 09/07/18  8:45 AM  Result Value Ref Range Status   MRSA by PCR NEGATIVE NEGATIVE Final  Comment:        The GeneXpert MRSA Assay (FDA approved for NASAL specimens only), is one component of a comprehensive MRSA colonization surveillance program. It is not intended to diagnose MRSA infection nor to guide or monitor treatment for MRSA infections. Performed at  [facility], [place] [address] [place], [place] [address]      Time coordinating discharge: 35 minutes.   SIGNED:   Elmarie Shiley, MD  [facility] 09/11/2018, 12:23 PM Pager   If 7PM-7AM, please contact night-coverage [email or web address] Password [email or web address]

## 2018-09-11 NOTE — Progress Notes (Signed)
Physical Therapy Treatment Patient Details Name: [name] MRN: [id number] DOB: Jan 06, 1946 Today's Date: 09/11/2018    History of Present Illness 72 yo male admitted to ED on 12/17 with confusion, lack of appetite. Pt with medical diagnosis of CAP with potential sepsis. 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     PT Comments    Pt's mobility has improved to min/guard.  No physical assist required today however some safety cues were provided.  Daughters present today and okay with d/c home as plan.  Recommended pt continue to use RW upon d/c for safety and provided daughters with gait belt.  Recommendations have been updated.   Follow Up Recommendations  Home health PT;Supervision/Assistance - 24 hour(initial supervision for safety)     Equipment Recommendations  Rolling walker with 5" wheels    Recommendations for Other Services       Precautions / Restrictions Precautions Precautions: Fall    Mobility  Bed Mobility Overal bed mobility: Needs Assistance Bed Mobility: Supine to Sit     Supine to sit: Supervision     General bed mobility comments: only cues to wait for therapist (rail needed to be let down)  Transfers Overall transfer level: Needs assistance Equipment used: Rolling walker (2 wheeled) Transfers: Sit to/from Stand Sit to Stand: Min guard         General transfer comment: min/guard for safety however no physical assist required, cues for hand placement upon sitting  Ambulation/Gait Ambulation/Gait assistance: Min guard Gait Distance (Feet): 400 Feet Assistive device: Rolling walker (2 wheeled) Gait Pattern/deviations: Step-through pattern;Decreased stride length     General Gait Details: steady with RW, denies any symptoms   Stairs             Wheelchair Mobility    Modified Rankin (Stroke Patients Only)        Balance                                            Cognition Arousal/Alertness: Awake/alert Behavior During Therapy: WFL for tasks assessed/performed Overall Cognitive Status: Impaired/Different from baseline                       Memory: Decreased short-term memory Following Commands: Follows one step commands consistently Safety/Judgement: Decreased awareness of safety   Problem Solving: Requires verbal cues General Comments: eager to mobilize however followed provided commands to wait for assistance      Exercises      General Comments        Pertinent Vitals/Pain Pain Assessment: No/denies pain    Home Living                      Prior Function            PT Goals (current goals can now be found in the care plan section) Progress towards PT goals: Progressing toward goals    Frequency    Min 2X/week      PT Plan Current plan remains appropriate    Co-evaluation              AM-PAC PT "6 Clicks" Mobility   Outcome Measure  Help needed turning from your back to your side while in a flat bed  without using bedrails?: A Little Help needed moving from lying on your back to sitting on the side of a flat bed without using bedrails?: A Little Help needed moving to and from a bed to a chair (including a wheelchair)?: A Little Help needed standing up from a chair using your arms (e.g., wheelchair or bedside chair)?: A Little Help needed to walk in hospital room?: A Little Help needed climbing 3-5 steps with a railing? : A Lot 6 Click Score: 17    End of Session Equipment Utilized During Treatment: Gait belt Activity Tolerance: Patient limited by fatigue;Other (comment) Patient left: with call bell/phone within reach;in chair;with family/visitor present Nurse Communication: Mobility status PT Visit Diagnosis: Unsteadiness on feet (R26.81);Other abnormalities of gait and mobility (R26.89)     Time:  4235-3614 PT Time Calculation (min) (ACUTE ONLY): 22 min  Charges:  $Gait Training: 8-22 mins                     Carmelia Bake, PT, DPT Acute Rehabilitation Services Office: [phone] Pager: [phone]  Trena Platt 09/11/2018, 2:00 PM

## 2018-09-11 NOTE — Care Management Note (Signed)
Case Management Note  Patient Details  Name: [name] MRN: [id number] Date of Birth: [birth year]  Subjective/Objective:  Noted ordered for HHRN/OT also-Patient notified. No further CM needs.                  Action/Plan:d/c home w/HHC/dme   Expected Discharge Date:  09/11/18               Expected Discharge Plan:  [place]  In-House Referral:     Discharge planning Services  CM Consult  Post Acute Care Choice:    Choice offered to:  Adult Children  DME Arranged:  Walker rolling DME Agency:  [facility] Arranged:  PT, RN, OT [facility] Agency:  [facility]  Status of Service:  Completed, signed off  If discussed at [place] of Stay Meetings, dates discussed:    Additional Comments:  Dessa Phi, RN 09/11/2018, 11:12 AM

## 2018-09-11 NOTE — Care Management Important Message (Signed)
Important Message  Patient Details  Name: [name] MRN: [id number] Date of Birth: [birth year]   Medicare Important Message Given:  Yes    Kerin Salen 09/11/2018, 10:57 AMImportant Message  Patient Details  Name: [name] MRN: [id number] Date of Birth: 06-Jun-1946   Medicare Important Message Given:  Yes    Kerin Salen 09/11/2018, 10:57 AM

## 2018-09-11 NOTE — Progress Notes (Signed)
Occupational Therapy Treatment Patient Details Name: [name] MRN: [id number] DOB: 04-Jun-1946 Today's Date: 09/11/2018    History of present illness 72 yo male admitted to ED on 12/17 with confusion, lack of appetite. Pt with medical diagnosis of CAP with potential sepsis. 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    OT comments  Red theraputty issued and educated in use  Follow Up Recommendations  Supervision/Assistance - 24 hour;Home health OT    Equipment Recommendations  3 in 1 bedside commode    Recommendations for Other Services      Precautions / Restrictions Precautions Precautions: Fall       Mobility Bed Mobility Overal bed mobility: Needs Assistance Bed Mobility: Supine to Sit     Supine to sit: Supervision     General bed mobility comments: only cues to wait for therapist (rail needed to be let down)  Transfers Overall transfer level: Needs assistance Equipment used: Rolling walker (2 wheeled) Transfers: Sit to/from [facility] Sit to Stand: Min guard Stand pivot transfers: Min guard       General transfer comment: min/guard for safety however no physical assist required, cues for hand placement upon sitting    Balance Overall balance assessment: Needs assistance Sitting-balance support: No upper extremity supported Sitting balance-Leahy Scale: Good     Standing balance support: Bilateral upper extremity supported Standing balance-Leahy Scale: Poor                             ADL either performed or assessed with clinical judgement   ADL Overall ADL's : Needs assistance/impaired Eating/Feeding: Set up;Sitting   Grooming: Set up;Sitting               Lower Body Dressing: Minimal assistance;Sit to/from stand;Cueing for sequencing;Cueing for safety                 General ADL  Comments: Issued red theraputty and educated on using for R hand to increase strength.   Pt return demonstrated using theraputty in R hand. ( making into a ball, making into a snake, pinching , etc))     Vision Patient Visual Report: No change from baseline            Cognition Arousal/Alertness: Awake/alert Behavior During Therapy: WFL for tasks assessed/performed Overall Cognitive Status: Impaired/Different from baseline                       Memory: Decreased short-term memory Following Commands: Follows one step commands consistently Safety/Judgement: Decreased awareness of safety   Problem Solving: Requires verbal cues General Comments: VC for safety                   Pertinent Vitals/ Pain       Pain Assessment: No/denies pain         Frequency  Min 2X/week        Progress Toward Goals  OT Goals(current goals can now be found in the care plan section)  Progress towards OT goals: Progressing toward goals     Plan Discharge plan needs to be updated       AM-PAC OT "6 Clicks" Daily Activity     Outcome Measure   Help from another person eating meals?: A Little Help from another person taking care of personal grooming?: A  Little Help from another person toileting, which includes using toliet, bedpan, or urinal?: A Little Help from another person bathing (including washing, rinsing, drying)?: A Little Help from another person to put on and taking off regular upper body clothing?: A Little Help from another person to put on and taking off regular lower body clothing?: A Little 6 Click Score: 18    End of Session Equipment Utilized During Treatment: Rolling walker  OT Visit Diagnosis: Unsteadiness on feet (R26.81);Other abnormalities of gait and mobility (R26.89);Repeated falls (R29.6);Muscle weakness (generalized) (M62.81)   Activity Tolerance Patient tolerated treatment well   Patient Left in chair;with call bell/phone within reach;with  nursing/sitter in room   Nurse Communication Mobility status        Time: 1555-1610 OT Time Calculation (min): 15 min  Charges: OT General Charges $OT Visit: 1 Visit OT Treatments $Self Care/Home Management : 8-22 mins  Kari Baars, [place] [phone] Office- [phone], Edwena Felty D 09/11/2018, 5:21 PM

## 2018-09-11 NOTE — Care Management Note (Signed)
Case Management Note  Patient Details  Name: Rene Sizelove MRN: [id number] Date of Birth: [birth year]  Subjective/Objective: PT recc HHPT,rw-spoke to patient in rm he deferred to daughters-they chose [facility]-rep Santiago Glad aware of d/c & HHC orders, & home dme-rw-already delivered to rm. Contact persons-1st Kim(dtr)[phone],2nd Chandra(dtr) [phone]. No further CM needs.                  Action/Plan:dc home w/HHC/dme   Expected Discharge Date:  09/11/18               Expected Discharge Plan:  [place]  In-House Referral:     Discharge planning Services  CM Consult  Post Acute Care Choice:    Choice offered to:  Adult Children  DME Arranged:  Walker rolling DME Agency:  [place] Arranged:  PT [place] Agency:  [facility]  Status of Service:  Completed, signed off  If discussed at [place] of Stay Meetings, dates discussed:    Additional Comments:  Dessa Phi, RN 09/11/2018, 11:05 AM

## 2018-09-15 DIAGNOSIS — Z794 Long term (current) use of insulin: Secondary | ICD-10-CM | POA: Diagnosis not present

## 2018-09-15 DIAGNOSIS — T2132XA Burn of third degree of abdominal wall, initial encounter: Secondary | ICD-10-CM | POA: Diagnosis not present

## 2018-09-15 DIAGNOSIS — E1165 Type 2 diabetes mellitus with hyperglycemia: Secondary | ICD-10-CM | POA: Diagnosis not present

## 2018-09-15 DIAGNOSIS — T22362A Burn of third degree of left scapular region, initial encounter: Secondary | ICD-10-CM | POA: Diagnosis not present

## 2018-09-15 DIAGNOSIS — G9341 Metabolic encephalopathy: Secondary | ICD-10-CM | POA: Diagnosis not present

## 2018-09-15 DIAGNOSIS — Z7982 Long term (current) use of aspirin: Secondary | ICD-10-CM | POA: Diagnosis not present

## 2018-09-15 DIAGNOSIS — Z4800 Encounter for change or removal of nonsurgical wound dressing: Secondary | ICD-10-CM | POA: Diagnosis not present

## 2018-09-15 DIAGNOSIS — I10 Essential (primary) hypertension: Secondary | ICD-10-CM | POA: Diagnosis not present

## 2018-09-15 DIAGNOSIS — J189 Pneumonia, unspecified organism: Secondary | ICD-10-CM | POA: Diagnosis not present

## 2018-09-15 DIAGNOSIS — Z7952 Long term (current) use of systemic steroids: Secondary | ICD-10-CM | POA: Diagnosis not present

## 2018-09-16 DIAGNOSIS — I10 Essential (primary) hypertension: Secondary | ICD-10-CM | POA: Diagnosis not present

## 2018-09-16 DIAGNOSIS — J189 Pneumonia, unspecified organism: Secondary | ICD-10-CM | POA: Diagnosis not present

## 2018-09-16 DIAGNOSIS — G9341 Metabolic encephalopathy: Secondary | ICD-10-CM | POA: Diagnosis not present

## 2018-09-16 DIAGNOSIS — E1165 Type 2 diabetes mellitus with hyperglycemia: Secondary | ICD-10-CM | POA: Diagnosis not present

## 2018-09-16 DIAGNOSIS — T2132XA Burn of third degree of abdominal wall, initial encounter: Secondary | ICD-10-CM | POA: Diagnosis not present

## 2018-09-16 DIAGNOSIS — T22362A Burn of third degree of left scapular region, initial encounter: Secondary | ICD-10-CM | POA: Diagnosis not present

## 2018-09-18 DIAGNOSIS — T2132XA Burn of third degree of abdominal wall, initial encounter: Secondary | ICD-10-CM | POA: Diagnosis not present

## 2018-09-18 DIAGNOSIS — I10 Essential (primary) hypertension: Secondary | ICD-10-CM | POA: Diagnosis not present

## 2018-09-18 DIAGNOSIS — G9341 Metabolic encephalopathy: Secondary | ICD-10-CM | POA: Diagnosis not present

## 2018-09-18 DIAGNOSIS — E1165 Type 2 diabetes mellitus with hyperglycemia: Secondary | ICD-10-CM | POA: Diagnosis not present

## 2018-09-18 DIAGNOSIS — J189 Pneumonia, unspecified organism: Secondary | ICD-10-CM | POA: Diagnosis not present

## 2018-09-18 DIAGNOSIS — T22362A Burn of third degree of left scapular region, initial encounter: Secondary | ICD-10-CM | POA: Diagnosis not present

## 2018-09-19 ENCOUNTER — Other Ambulatory Visit: Payer: Self-pay | Admitting: Interventional Cardiology

## 2018-09-21 DIAGNOSIS — Z940 Kidney transplant status: Secondary | ICD-10-CM | POA: Diagnosis not present

## 2018-09-27 ENCOUNTER — Encounter (HOSPITAL_COMMUNITY): Payer: Self-pay

## 2018-09-27 ENCOUNTER — Encounter (HOSPITAL_COMMUNITY)
Admission: RE | Admit: 2018-09-27 | Discharge: 2018-09-27 | Disposition: A | Discharge status: Home / Self Care | Payer: Medicare Other | Source: Ambulatory Visit | Attending: Nephrology | Admitting: Nephrology

## 2018-09-27 DIAGNOSIS — T22362A Burn of third degree of left scapular region, initial encounter: Secondary | ICD-10-CM | POA: Diagnosis not present

## 2018-09-27 DIAGNOSIS — E1165 Type 2 diabetes mellitus with hyperglycemia: Secondary | ICD-10-CM | POA: Diagnosis not present

## 2018-09-27 DIAGNOSIS — J189 Pneumonia, unspecified organism: Secondary | ICD-10-CM | POA: Diagnosis not present

## 2018-09-27 DIAGNOSIS — T2132XA Burn of third degree of abdominal wall, initial encounter: Secondary | ICD-10-CM | POA: Diagnosis not present

## 2018-09-27 DIAGNOSIS — G9341 Metabolic encephalopathy: Secondary | ICD-10-CM | POA: Diagnosis not present

## 2018-09-27 DIAGNOSIS — N185 Chronic kidney disease, stage 5: Secondary | ICD-10-CM | POA: Diagnosis not present

## 2018-09-27 DIAGNOSIS — I10 Essential (primary) hypertension: Secondary | ICD-10-CM | POA: Diagnosis not present

## 2018-09-27 DIAGNOSIS — D631 Anemia in chronic kidney disease: Secondary | ICD-10-CM | POA: Diagnosis not present

## 2018-09-27 HISTORY — DX: Unspecified open wound, left lower leg, initial encounter: S81.802A

## 2018-09-27 LAB — HEMOGLOBIN AND HEMATOCRIT, BLOOD
HCT: 27.9 % — ABNORMAL LOW (ref 39.0–52.0)
HEMOGLOBIN: 7.9 g/dL — AB (ref 13.0–17.0)

## 2018-09-27 MED ORDER — DARBEPOETIN ALFA 60 MCG/0.3ML IJ SOSY
120.0000 ug | PREFILLED_SYRINGE | INTRAMUSCULAR | Status: DC
  Administered 2018-09-27: 120 ug via SUBCUTANEOUS
  Filled 2018-09-27: qty 0.6

## 2018-09-27 NOTE — Discharge Instructions (Signed)

## 2018-09-28 DIAGNOSIS — I10 Essential (primary) hypertension: Secondary | ICD-10-CM | POA: Diagnosis not present

## 2018-09-28 DIAGNOSIS — J189 Pneumonia, unspecified organism: Secondary | ICD-10-CM | POA: Diagnosis not present

## 2018-09-28 DIAGNOSIS — T2132XA Burn of third degree of abdominal wall, initial encounter: Secondary | ICD-10-CM | POA: Diagnosis not present

## 2018-09-28 DIAGNOSIS — G9341 Metabolic encephalopathy: Secondary | ICD-10-CM | POA: Diagnosis not present

## 2018-09-28 DIAGNOSIS — T22362A Burn of third degree of left scapular region, initial encounter: Secondary | ICD-10-CM | POA: Diagnosis not present

## 2018-09-28 DIAGNOSIS — E1165 Type 2 diabetes mellitus with hyperglycemia: Secondary | ICD-10-CM | POA: Diagnosis not present

## 2018-09-29 DIAGNOSIS — T2132XA Burn of third degree of abdominal wall, initial encounter: Secondary | ICD-10-CM | POA: Diagnosis not present

## 2018-09-29 DIAGNOSIS — G9341 Metabolic encephalopathy: Secondary | ICD-10-CM | POA: Diagnosis not present

## 2018-09-29 DIAGNOSIS — I10 Essential (primary) hypertension: Secondary | ICD-10-CM | POA: Diagnosis not present

## 2018-09-29 DIAGNOSIS — J189 Pneumonia, unspecified organism: Secondary | ICD-10-CM | POA: Diagnosis not present

## 2018-09-29 DIAGNOSIS — E1165 Type 2 diabetes mellitus with hyperglycemia: Secondary | ICD-10-CM | POA: Diagnosis not present

## 2018-09-29 DIAGNOSIS — T22362A Burn of third degree of left scapular region, initial encounter: Secondary | ICD-10-CM | POA: Diagnosis not present

## 2018-10-03 DIAGNOSIS — T22362A Burn of third degree of left scapular region, initial encounter: Secondary | ICD-10-CM | POA: Diagnosis not present

## 2018-10-03 DIAGNOSIS — I10 Essential (primary) hypertension: Secondary | ICD-10-CM | POA: Diagnosis not present

## 2018-10-03 DIAGNOSIS — J189 Pneumonia, unspecified organism: Secondary | ICD-10-CM | POA: Diagnosis not present

## 2018-10-03 DIAGNOSIS — T2132XA Burn of third degree of abdominal wall, initial encounter: Secondary | ICD-10-CM | POA: Diagnosis not present

## 2018-10-03 DIAGNOSIS — E1165 Type 2 diabetes mellitus with hyperglycemia: Secondary | ICD-10-CM | POA: Diagnosis not present

## 2018-10-03 DIAGNOSIS — G9341 Metabolic encephalopathy: Secondary | ICD-10-CM | POA: Diagnosis not present

## 2018-10-05 ENCOUNTER — Encounter (HOSPITAL_BASED_OUTPATIENT_CLINIC_OR_DEPARTMENT_OTHER): Payer: Medicare Other | Attending: Internal Medicine

## 2018-10-05 DIAGNOSIS — I10 Essential (primary) hypertension: Secondary | ICD-10-CM | POA: Diagnosis not present

## 2018-10-05 DIAGNOSIS — E1140 Type 2 diabetes mellitus with diabetic neuropathy, unspecified: Secondary | ICD-10-CM | POA: Diagnosis not present

## 2018-10-05 DIAGNOSIS — L97822 Non-pressure chronic ulcer of other part of left lower leg with fat layer exposed: Secondary | ICD-10-CM | POA: Diagnosis not present

## 2018-10-05 DIAGNOSIS — E11622 Type 2 diabetes mellitus with other skin ulcer: Secondary | ICD-10-CM | POA: Insufficient documentation

## 2018-10-05 DIAGNOSIS — S61411A Laceration without foreign body of right hand, initial encounter: Secondary | ICD-10-CM | POA: Insufficient documentation

## 2018-10-05 DIAGNOSIS — T2132XA Burn of third degree of abdominal wall, initial encounter: Secondary | ICD-10-CM | POA: Diagnosis not present

## 2018-10-05 DIAGNOSIS — E1165 Type 2 diabetes mellitus with hyperglycemia: Secondary | ICD-10-CM | POA: Diagnosis not present

## 2018-10-05 DIAGNOSIS — X58XXXA Exposure to other specified factors, initial encounter: Secondary | ICD-10-CM | POA: Diagnosis not present

## 2018-10-05 DIAGNOSIS — Z992 Dependence on renal dialysis: Secondary | ICD-10-CM | POA: Diagnosis not present

## 2018-10-05 DIAGNOSIS — J189 Pneumonia, unspecified organism: Secondary | ICD-10-CM | POA: Diagnosis not present

## 2018-10-05 DIAGNOSIS — T22362A Burn of third degree of left scapular region, initial encounter: Secondary | ICD-10-CM | POA: Diagnosis not present

## 2018-10-05 DIAGNOSIS — G9341 Metabolic encephalopathy: Secondary | ICD-10-CM | POA: Diagnosis not present

## 2018-10-11 DIAGNOSIS — E1151 Type 2 diabetes mellitus with diabetic peripheral angiopathy without gangrene: Secondary | ICD-10-CM | POA: Diagnosis not present

## 2018-10-12 DIAGNOSIS — E1140 Type 2 diabetes mellitus with diabetic neuropathy, unspecified: Secondary | ICD-10-CM | POA: Diagnosis not present

## 2018-10-12 DIAGNOSIS — L97822 Non-pressure chronic ulcer of other part of left lower leg with fat layer exposed: Secondary | ICD-10-CM | POA: Diagnosis not present

## 2018-10-12 DIAGNOSIS — E11622 Type 2 diabetes mellitus with other skin ulcer: Secondary | ICD-10-CM | POA: Diagnosis not present

## 2018-10-12 DIAGNOSIS — S61411A Laceration without foreign body of right hand, initial encounter: Secondary | ICD-10-CM | POA: Diagnosis not present

## 2018-10-12 DIAGNOSIS — Z992 Dependence on renal dialysis: Secondary | ICD-10-CM | POA: Diagnosis not present

## 2018-10-12 DIAGNOSIS — I10 Essential (primary) hypertension: Secondary | ICD-10-CM | POA: Diagnosis not present

## 2018-10-13 DIAGNOSIS — N179 Acute kidney failure, unspecified: Secondary | ICD-10-CM | POA: Diagnosis not present

## 2018-10-13 DIAGNOSIS — E785 Hyperlipidemia, unspecified: Secondary | ICD-10-CM | POA: Diagnosis not present

## 2018-10-13 DIAGNOSIS — R0600 Dyspnea, unspecified: Secondary | ICD-10-CM | POA: Diagnosis not present

## 2018-10-13 DIAGNOSIS — Z940 Kidney transplant status: Secondary | ICD-10-CM | POA: Diagnosis not present

## 2018-10-13 DIAGNOSIS — N2581 Secondary hyperparathyroidism of renal origin: Secondary | ICD-10-CM | POA: Diagnosis not present

## 2018-10-13 DIAGNOSIS — E118 Type 2 diabetes mellitus with unspecified complications: Secondary | ICD-10-CM | POA: Diagnosis not present

## 2018-10-13 DIAGNOSIS — N189 Chronic kidney disease, unspecified: Secondary | ICD-10-CM | POA: Diagnosis not present

## 2018-10-13 DIAGNOSIS — R001 Bradycardia, unspecified: Secondary | ICD-10-CM | POA: Diagnosis not present

## 2018-10-13 DIAGNOSIS — D631 Anemia in chronic kidney disease: Secondary | ICD-10-CM | POA: Diagnosis not present

## 2018-10-13 DIAGNOSIS — I739 Peripheral vascular disease, unspecified: Secondary | ICD-10-CM | POA: Diagnosis not present

## 2018-10-13 DIAGNOSIS — R0601 Orthopnea: Secondary | ICD-10-CM | POA: Diagnosis not present

## 2018-10-13 DIAGNOSIS — I129 Hypertensive chronic kidney disease with stage 1 through stage 4 chronic kidney disease, or unspecified chronic kidney disease: Secondary | ICD-10-CM | POA: Diagnosis not present

## 2018-10-17 ENCOUNTER — Other Ambulatory Visit: Payer: Self-pay | Admitting: Interventional Cardiology

## 2018-10-19 DIAGNOSIS — S61411A Laceration without foreign body of right hand, initial encounter: Secondary | ICD-10-CM | POA: Diagnosis not present

## 2018-10-19 DIAGNOSIS — I10 Essential (primary) hypertension: Secondary | ICD-10-CM | POA: Diagnosis not present

## 2018-10-19 DIAGNOSIS — E11622 Type 2 diabetes mellitus with other skin ulcer: Secondary | ICD-10-CM | POA: Diagnosis not present

## 2018-10-19 DIAGNOSIS — L97822 Non-pressure chronic ulcer of other part of left lower leg with fat layer exposed: Secondary | ICD-10-CM | POA: Diagnosis not present

## 2018-10-19 DIAGNOSIS — E1140 Type 2 diabetes mellitus with diabetic neuropathy, unspecified: Secondary | ICD-10-CM | POA: Diagnosis not present

## 2018-10-19 DIAGNOSIS — Z992 Dependence on renal dialysis: Secondary | ICD-10-CM | POA: Diagnosis not present

## 2018-10-25 ENCOUNTER — Encounter (HOSPITAL_COMMUNITY): Payer: Self-pay

## 2018-10-25 ENCOUNTER — Encounter (HOSPITAL_COMMUNITY)
Admission: RE | Admit: 2018-10-25 | Discharge: 2018-10-25 | Disposition: A | Discharge status: Home / Self Care | Payer: Medicare Other | Source: Ambulatory Visit | Attending: Nephrology | Admitting: Nephrology

## 2018-10-25 DIAGNOSIS — N185 Chronic kidney disease, stage 5: Secondary | ICD-10-CM | POA: Diagnosis not present

## 2018-10-25 DIAGNOSIS — D631 Anemia in chronic kidney disease: Secondary | ICD-10-CM | POA: Diagnosis not present

## 2018-10-25 LAB — HEMOGLOBIN: Hemoglobin: 9.2 g/dL — ABNORMAL LOW (ref 13.0–17.0)

## 2018-10-25 MED ORDER — DARBEPOETIN ALFA 150 MCG/0.3ML IJ SOSY
120.0000 ug | PREFILLED_SYRINGE | INTRAMUSCULAR | Status: DC
  Administered 2018-10-25: 120 ug via SUBCUTANEOUS
  Filled 2018-10-25: qty 0.3

## 2018-10-25 NOTE — Discharge Instructions (Signed)
Darbepoetin Alfa injection (Aranesp) What is this medicine? DARBEPOETIN ALFA (dar be POE e tin AL fa) helps your body make more red blood cells. It is used to treat anemia caused by chronic kidney failure and chemotherapy. This medicine may be used for other purposes; ask your health care provider or pharmacist if you have questions. COMMON BRAND NAME(S): Aranesp What should I tell my health care provider before I take this medicine? They need to know if you have any of these conditions: -blood clotting disorders or history of blood clots -cancer patient not on chemotherapy -cystic fibrosis -heart disease, such as angina, heart failure, or a history of a heart attack -hemoglobin level of 12 g/dL or greater -high blood pressure -low levels of folate, iron, or vitamin B12 -seizures -an unusual or allergic reaction to darbepoetin, erythropoietin, albumin, hamster proteins, latex, other medicines, foods, dyes, or preservatives -pregnant or trying to get pregnant -breast-feeding How should I use this medicine? This medicine is for injection into a vein or under the skin. It is usually given by a health care professional in a hospital or clinic setting. If you get this medicine at home, you will be taught how to prepare and give this medicine. Use exactly as directed. Take your medicine at regular intervals. Do not take your medicine more often than directed. It is important that you put your used needles and syringes in a special sharps container. Do not put them in a trash can. If you do not have a sharps container, call your pharmacist or healthcare provider to get one. A special MedGuide will be given to you by the pharmacist with each prescription and refill. Be sure to read this information carefully each time. Talk to your pediatrician regarding the use of this medicine in children. While this medicine may be used in children as young as 1 month of age for selected conditions, precautions do  apply. Overdosage: If you think you have taken too much of this medicine contact a poison control center or emergency room at once. NOTE: This medicine is only for you. Do not share this medicine with others. What if I miss a dose? If you miss a dose, take it as soon as you can. If it is almost time for your next dose, take only that dose. Do not take double or extra doses. What may interact with this medicine? Do not take this medicine with any of the following medications: -epoetin alfa This list may not describe all possible interactions. Give your health care provider a list of all the medicines, herbs, non-prescription drugs, or dietary supplements you use. Also tell them if you smoke, drink alcohol, or use illegal drugs. Some items may interact with your medicine. What should I watch for while using this medicine? Your condition will be monitored carefully while you are receiving this medicine. You may need blood work done while you are taking this medicine. This medicine may cause a decrease in vitamin B6. You should make sure that you get enough vitamin B6 while you are taking this medicine. Discuss the foods you eat and the vitamins you take with your health care professional. What side effects may I notice from receiving this medicine? Side effects that you should report to your doctor or health care professional as soon as possible: -allergic reactions like skin rash, itching or hives, swelling of the face, lips, or tongue -breathing problems -changes in vision -chest pain -confusion, trouble speaking or understanding -feeling faint or lightheaded, falls -high   blood pressure -muscle aches or pains -pain, swelling, warmth in the leg -rapid weight gain -severe headaches -sudden numbness or weakness of the face, arm or leg -trouble walking, dizziness, loss of balance or coordination -seizures (convulsions) -swelling of the ankles, feet, hands -unusually weak or tired Side  effects that usually do not require medical attention (report to your doctor or health care professional if they continue or are bothersome): -diarrhea -fever, chills (flu-like symptoms) -headaches -nausea, vomiting -redness, stinging, or swelling at site where injected This list may not describe all possible side effects. Call your doctor for medical advice about side effects. You may report side effects to FDA at 1-800-FDA-1088. Where should I keep my medicine? Keep out of the reach of children. Store in a refrigerator between 2 and 8 degrees C (36 and 46 degrees F). Do not freeze. Do not shake. Throw away any unused portion if using a single-dose vial. Throw away any unused medicine after the expiration date. NOTE: This sheet is a summary. It may not cover all possible information. If you have questions about this medicine, talk to your doctor, pharmacist, or health care provider.  2019 Elsevier/Gold Standard (2017-09-21 16:44:20)  

## 2018-10-26 ENCOUNTER — Encounter (HOSPITAL_BASED_OUTPATIENT_CLINIC_OR_DEPARTMENT_OTHER): Payer: Medicare Other | Attending: Internal Medicine

## 2018-10-26 DIAGNOSIS — E11622 Type 2 diabetes mellitus with other skin ulcer: Secondary | ICD-10-CM | POA: Insufficient documentation

## 2018-10-26 DIAGNOSIS — L97822 Non-pressure chronic ulcer of other part of left lower leg with fat layer exposed: Secondary | ICD-10-CM | POA: Diagnosis not present

## 2018-10-26 DIAGNOSIS — S61411A Laceration without foreign body of right hand, initial encounter: Secondary | ICD-10-CM | POA: Diagnosis not present

## 2018-11-02 DIAGNOSIS — I890 Lymphedema, not elsewhere classified: Secondary | ICD-10-CM | POA: Diagnosis not present

## 2018-11-02 DIAGNOSIS — L97829 Non-pressure chronic ulcer of other part of left lower leg with unspecified severity: Secondary | ICD-10-CM | POA: Diagnosis not present

## 2018-11-02 DIAGNOSIS — S61411A Laceration without foreign body of right hand, initial encounter: Secondary | ICD-10-CM | POA: Diagnosis not present

## 2018-11-02 DIAGNOSIS — E11622 Type 2 diabetes mellitus with other skin ulcer: Secondary | ICD-10-CM | POA: Diagnosis not present

## 2018-11-02 DIAGNOSIS — L97822 Non-pressure chronic ulcer of other part of left lower leg with fat layer exposed: Secondary | ICD-10-CM | POA: Diagnosis not present

## 2018-11-07 ENCOUNTER — Other Ambulatory Visit: Payer: Self-pay | Admitting: Interventional Cardiology

## 2018-11-08 ENCOUNTER — Other Ambulatory Visit: Payer: Self-pay | Admitting: *Deleted

## 2018-11-08 MED ORDER — CARVEDILOL 6.25 MG PO TABS: 6.2500 mg | ORAL_TABLET | Freq: Two times a day (BID) | ORAL | 0 refills | Status: DC

## 2018-11-13 ENCOUNTER — Other Ambulatory Visit: Payer: Self-pay | Admitting: Interventional Cardiology

## 2018-11-22 ENCOUNTER — Encounter (INDEPENDENT_AMBULATORY_CARE_PROVIDER_SITE_OTHER): Payer: Self-pay

## 2018-11-22 ENCOUNTER — Encounter (HOSPITAL_COMMUNITY)
Admission: RE | Admit: 2018-11-22 | Discharge: 2018-11-22 | Disposition: A | Discharge status: Home / Self Care | Payer: Medicare Other | Source: Ambulatory Visit | Attending: Nephrology | Admitting: Nephrology

## 2018-11-22 ENCOUNTER — Encounter (HOSPITAL_COMMUNITY): Payer: Self-pay

## 2018-11-22 ENCOUNTER — Other Ambulatory Visit: Payer: Self-pay

## 2018-11-22 DIAGNOSIS — D631 Anemia in chronic kidney disease: Secondary | ICD-10-CM | POA: Insufficient documentation

## 2018-11-22 DIAGNOSIS — N185 Chronic kidney disease, stage 5: Secondary | ICD-10-CM | POA: Insufficient documentation

## 2018-11-22 LAB — HEMOGLOBIN: Hemoglobin: 9.9 g/dL — ABNORMAL LOW (ref 13.0–17.0)

## 2018-11-22 MED ORDER — DARBEPOETIN ALFA 60 MCG/0.3ML IJ SOSY
120.0000 ug | PREFILLED_SYRINGE | INTRAMUSCULAR | Status: DC
  Administered 2018-11-22: 120 ug via SUBCUTANEOUS
  Filled 2018-11-22: qty 0.6

## 2018-11-23 DIAGNOSIS — Z940 Kidney transplant status: Secondary | ICD-10-CM | POA: Diagnosis not present

## 2018-11-23 DIAGNOSIS — E1165 Type 2 diabetes mellitus with hyperglycemia: Secondary | ICD-10-CM | POA: Diagnosis not present

## 2018-11-23 DIAGNOSIS — I10 Essential (primary) hypertension: Secondary | ICD-10-CM | POA: Diagnosis not present

## 2018-11-23 DIAGNOSIS — E7800 Pure hypercholesterolemia, unspecified: Secondary | ICD-10-CM | POA: Diagnosis not present

## 2018-11-23 DIAGNOSIS — G609 Hereditary and idiopathic neuropathy, unspecified: Secondary | ICD-10-CM | POA: Diagnosis not present

## 2018-11-30 DIAGNOSIS — Z940 Kidney transplant status: Secondary | ICD-10-CM | POA: Diagnosis not present

## 2018-11-30 DIAGNOSIS — N189 Chronic kidney disease, unspecified: Secondary | ICD-10-CM | POA: Diagnosis not present

## 2018-11-30 DIAGNOSIS — I129 Hypertensive chronic kidney disease with stage 1 through stage 4 chronic kidney disease, or unspecified chronic kidney disease: Secondary | ICD-10-CM | POA: Diagnosis not present

## 2018-12-04 DIAGNOSIS — R001 Bradycardia, unspecified: Secondary | ICD-10-CM | POA: Diagnosis not present

## 2018-12-04 DIAGNOSIS — E785 Hyperlipidemia, unspecified: Secondary | ICD-10-CM | POA: Diagnosis not present

## 2018-12-04 DIAGNOSIS — Z79899 Other long term (current) drug therapy: Secondary | ICD-10-CM | POA: Diagnosis not present

## 2018-12-04 DIAGNOSIS — N2581 Secondary hyperparathyroidism of renal origin: Secondary | ICD-10-CM | POA: Diagnosis not present

## 2018-12-04 DIAGNOSIS — D631 Anemia in chronic kidney disease: Secondary | ICD-10-CM | POA: Diagnosis not present

## 2018-12-04 DIAGNOSIS — N189 Chronic kidney disease, unspecified: Secondary | ICD-10-CM | POA: Diagnosis not present

## 2018-12-04 DIAGNOSIS — E118 Type 2 diabetes mellitus with unspecified complications: Secondary | ICD-10-CM | POA: Diagnosis not present

## 2018-12-04 DIAGNOSIS — I739 Peripheral vascular disease, unspecified: Secondary | ICD-10-CM | POA: Diagnosis not present

## 2018-12-04 DIAGNOSIS — R0601 Orthopnea: Secondary | ICD-10-CM | POA: Diagnosis not present

## 2018-12-04 DIAGNOSIS — N179 Acute kidney failure, unspecified: Secondary | ICD-10-CM | POA: Diagnosis not present

## 2018-12-04 DIAGNOSIS — I129 Hypertensive chronic kidney disease with stage 1 through stage 4 chronic kidney disease, or unspecified chronic kidney disease: Secondary | ICD-10-CM | POA: Diagnosis not present

## 2018-12-04 DIAGNOSIS — Z940 Kidney transplant status: Secondary | ICD-10-CM | POA: Diagnosis not present

## 2018-12-27 ENCOUNTER — Encounter (HOSPITAL_COMMUNITY)
Admission: RE | Admit: 2018-12-27 | Discharge: 2018-12-27 | Disposition: A | Discharge status: Home / Self Care | Payer: Medicare Other | Source: Ambulatory Visit | Attending: Nephrology | Admitting: Nephrology

## 2018-12-27 ENCOUNTER — Encounter (HOSPITAL_COMMUNITY): Payer: Self-pay

## 2018-12-27 ENCOUNTER — Other Ambulatory Visit: Payer: Self-pay

## 2018-12-27 DIAGNOSIS — N185 Chronic kidney disease, stage 5: Secondary | ICD-10-CM | POA: Insufficient documentation

## 2018-12-27 DIAGNOSIS — D631 Anemia in chronic kidney disease: Secondary | ICD-10-CM | POA: Diagnosis not present

## 2018-12-27 LAB — HEMOGLOBIN: Hemoglobin: 9.9 g/dL — ABNORMAL LOW (ref 13.0–17.0)

## 2018-12-27 MED ORDER — DARBEPOETIN ALFA 60 MCG/0.3ML IJ SOSY
120.0000 ug | PREFILLED_SYRINGE | INTRAMUSCULAR | Status: DC
  Administered 2018-12-27: 120 ug via SUBCUTANEOUS
  Filled 2018-12-27: qty 0.6

## 2018-12-27 NOTE — Discharge Instructions (Signed)
Epoetin Alfa injection °What is this medicine? °EPOETIN ALFA (e POE e tin AL fa) helps your body make more red blood cells. This medicine is used to treat anemia caused by chronic kidney disease, cancer chemotherapy, or HIV-therapy. It may also be used before surgery if you have anemia. °This medicine may be used for other purposes; ask your health care provider or pharmacist if you have questions. °COMMON BRAND NAME(S): Epogen, Procrit, Retacrit °What should I tell my health care provider before I take this medicine? °They need to know if you have any of these conditions: °-cancer °-heart disease °-high blood pressure °-history of blood clots °-history of stroke °-low levels of folate, iron, or vitamin B12 in the blood °-seizures °-an unusual or allergic reaction to erythropoietin, albumin, benzyl alcohol, hamster proteins, other medicines, foods, dyes, or preservatives °-pregnant or trying to get pregnant °-breast-feeding °How should I use this medicine? °This medicine is for injection into a vein or under the skin. It is usually given by a health care professional in a hospital or clinic setting. °If you get this medicine at home, you will be taught how to prepare and give this medicine. Use exactly as directed. Take your medicine at regular intervals. Do not take your medicine more often than directed. °It is important that you put your used needles and syringes in a special sharps container. Do not put them in a trash can. If you do not have a sharps container, call your pharmacist or healthcare provider to get one. °A special MedGuide will be given to you by the pharmacist with each prescription and refill. Be sure to read this information carefully each time. °Talk to your pediatrician regarding the use of this medicine in children. While this drug may be prescribed for selected conditions, precautions do apply. °Overdosage: If you think you have taken too much of this medicine contact a poison control center  or emergency room at once. °NOTE: This medicine is only for you. Do not share this medicine with others. °What if I miss a dose? °If you miss a dose, take it as soon as you can. If it is almost time for your next dose, take only that dose. Do not take double or extra doses. °What may interact with this medicine? °Interactions have not been studied. °This list may not describe all possible interactions. Give your health care provider a list of all the medicines, herbs, non-prescription drugs, or dietary supplements you use. Also tell them if you smoke, drink alcohol, or use illegal drugs. Some items may interact with your medicine. °What should I watch for while using this medicine? °Your condition will be monitored carefully while you are receiving this medicine. °You may need blood work done while you are taking this medicine. °This medicine may cause a decrease in vitamin B6. You should make sure that you get enough vitamin B6 while you are taking this medicine. Discuss the foods you eat and the vitamins you take with your health care professional. °What side effects may I notice from receiving this medicine? °Side effects that you should report to your doctor or health care professional as soon as possible: °-allergic reactions like skin rash, itching or hives, swelling of the face, lips, or tongue °-seizures °-signs and symptoms of a blood clot such as breathing problems; changes in vision; chest pain; severe, sudden headache; pain, swelling, warmth in the leg; trouble speaking; sudden numbness or weakness of the face, arm or leg °-signs and symptoms of a stroke   like changes in vision; confusion; trouble speaking or understanding; severe headaches; sudden numbness or weakness of the face, arm or leg; trouble walking; dizziness; loss of balance or coordination °Side effects that usually do not require medical attention (report to your doctor or health care professional if they continue or are  bothersome): °-chills °-cough °-dizziness °-fever °-headaches °-joint pain °-muscle cramps °-muscle pain °-nausea, vomiting °-pain, redness, or irritation at site where injected °This list may not describe all possible side effects. Call your doctor for medical advice about side effects. You may report side effects to FDA at 1-800-FDA-1088. °Where should I keep my medicine? °Keep out of the reach of children. °Store in a refrigerator between 2 and 8 degrees C (36 and 46 degrees F). Do not freeze or shake. Throw away any unused portion if using a single-dose vial. Multi-dose vials can be kept in the refrigerator for up to 21 days after the initial dose. Throw away unused medicine. °NOTE: This sheet is a summary. It may not cover all possible information. If you have questions about this medicine, talk to your doctor, pharmacist, or health care provider. °© 2019 Elsevier/Gold Standard (2017-04-15 08:35:19) ° °

## 2018-12-29 ENCOUNTER — Telehealth: Payer: Self-pay | Admitting: Interventional Cardiology

## 2018-12-29 NOTE — Telephone Encounter (Signed)
Spoke with pt and he does not have a smart phone to do a webex visit and did not wish to do a phone visit.  Pt states he is doing ok and would prefer to reschedule.  Rescheduled pt for 8/17.  Advised to call sooner if any issues.  Pt verbalized understanding and was in agreement with plan.

## 2018-12-29 NOTE — Telephone Encounter (Signed)
Follow Up    Pt said he was returning a call from Dr Thompson Caul office, but did not know who called.

## 2019-01-09 ENCOUNTER — Ambulatory Visit: Payer: Medicare Other | Admitting: Interventional Cardiology

## 2019-01-29 ENCOUNTER — Other Ambulatory Visit: Payer: Self-pay

## 2019-01-29 ENCOUNTER — Ambulatory Visit (HOSPITAL_COMMUNITY)
Admission: RE | Admit: 2019-01-29 | Discharge: 2019-01-29 | Disposition: A | Discharge status: Home / Self Care | Payer: Medicare Other | Source: Ambulatory Visit | Attending: Nephrology | Admitting: Nephrology

## 2019-01-29 ENCOUNTER — Encounter (HOSPITAL_COMMUNITY): Payer: Medicare Other

## 2019-01-29 DIAGNOSIS — N185 Chronic kidney disease, stage 5: Secondary | ICD-10-CM | POA: Insufficient documentation

## 2019-01-29 DIAGNOSIS — D631 Anemia in chronic kidney disease: Secondary | ICD-10-CM | POA: Insufficient documentation

## 2019-01-29 LAB — HEMOGLOBIN: Hemoglobin: 9.9 g/dL — ABNORMAL LOW (ref 13.0–17.0)

## 2019-01-29 MED ORDER — DARBEPOETIN ALFA 60 MCG/0.3ML IJ SOSY
120.0000 ug | PREFILLED_SYRINGE | INTRAMUSCULAR | Status: DC
  Administered 2019-01-29: 120 ug via SUBCUTANEOUS
  Filled 2019-01-29: qty 0.6

## 2019-01-29 NOTE — Discharge Instructions (Signed)

## 2019-01-29 NOTE — Progress Notes (Signed)
PATIENT CARE CENTER NOTE  Diagnosis: Anemia associated with Chronic Renal Failure   Provider: [name], MD   Procedure: Aranesp injections    Note: Patient received sub-q Aranesp injections in left arm. Tolerated injections well. No adverse reaction noted. Pre-injection, Hemoglobin 9.9 and BP 129/62. Discharge paperwork given to patient. Alert, oriented and ambulatory at discharge.

## 2019-02-01 DIAGNOSIS — I129 Hypertensive chronic kidney disease with stage 1 through stage 4 chronic kidney disease, or unspecified chronic kidney disease: Secondary | ICD-10-CM | POA: Diagnosis not present

## 2019-02-01 DIAGNOSIS — N189 Chronic kidney disease, unspecified: Secondary | ICD-10-CM | POA: Diagnosis not present

## 2019-02-01 DIAGNOSIS — Z940 Kidney transplant status: Secondary | ICD-10-CM | POA: Diagnosis not present

## 2019-02-01 DIAGNOSIS — E118 Type 2 diabetes mellitus with unspecified complications: Secondary | ICD-10-CM | POA: Diagnosis not present

## 2019-02-15 ENCOUNTER — Telehealth: Payer: Self-pay | Admitting: Interventional Cardiology

## 2019-02-15 DIAGNOSIS — I5032 Chronic diastolic (congestive) heart failure: Secondary | ICD-10-CM

## 2019-02-15 NOTE — Telephone Encounter (Signed)
Spoke with pt and went over recommendations.  Advised I will place order for urgent echo and we will do labs the same day.  Then will plan to see Dr. Tamala Julian next available.  Pt appreciative for call.

## 2019-02-15 NOTE — Telephone Encounter (Signed)
He needs echocardiogram to assess LV. Also needs BMET and BNP. OV after that with me on DOD

## 2019-02-15 NOTE — Telephone Encounter (Signed)
Pt c/o Shortness Of Breath: STAT if SOB developed within the last 24 hours or pt is noticeably SOB on the phone  1. Are you currently SOB (can you hear that pt is SOB on the phone)? no  2. How long have you been experiencing  It started about a month ago,  3. Are you SOB when sitting or when up moving around? When moving around  4. Are you currently experiencing any other symptoms? No- pt would like to be seen in the office by Dr Tamala Julian or one of  assistant

## 2019-02-15 NOTE — Telephone Encounter (Signed)
Pt states for about a month now he has noticed increased DOE.  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  Resolves in 5 mins or less.  Pt has gained about 15lbs since February.  States he has had weight gain like this before and didn't get this winded.  Denies CP, lightheadedness, dizziness, blurred vision or HA.  Does have swelling in lower extremities that improves with elevation and compression stockings.  Admits to having more salt than usual over the last couple of weeks.  Recently seen Nephro or PCP and they advised him to increase Furosemide to 120mg  QD for 3 days and then decrease back to 60mg .  Pt has taken 120mg  2 or 3 more times since then because he felt like he was still holding on to extra fluid.  No vitals available.  Pt would like to be seen in person if possible.  Advised I would send to Dr. Tamala Julian for review and advisement and will schedule on PA or NP schedule if Dr. Tamala Julian felt like he needed to be seen soon.

## 2019-02-27 ENCOUNTER — Telehealth: Payer: Self-pay

## 2019-02-27 ENCOUNTER — Other Ambulatory Visit: Payer: Self-pay

## 2019-02-27 ENCOUNTER — Encounter (HOSPITAL_COMMUNITY)
Admission: RE | Admit: 2019-02-27 | Discharge: 2019-02-27 | Disposition: A | Discharge status: Home / Self Care | Payer: Medicare Other | Source: Ambulatory Visit | Attending: Nephrology | Admitting: Nephrology

## 2019-02-27 DIAGNOSIS — N185 Chronic kidney disease, stage 5: Secondary | ICD-10-CM | POA: Insufficient documentation

## 2019-02-27 DIAGNOSIS — D631 Anemia in chronic kidney disease: Secondary | ICD-10-CM | POA: Insufficient documentation

## 2019-02-27 LAB — HEMOGLOBIN: Hemoglobin: 9.9 g/dL — ABNORMAL LOW (ref 13.0–17.0)

## 2019-02-27 MED ORDER — DARBEPOETIN ALFA 150 MCG/0.3ML IJ SOSY
120.0000 ug | PREFILLED_SYRINGE | INTRAMUSCULAR | Status: DC
  Administered 2019-02-27: 120 ug via SUBCUTANEOUS
  Filled 2019-02-27: qty 0.3

## 2019-02-27 NOTE — Discharge Instructions (Signed)

## 2019-02-27 NOTE — Telephone Encounter (Signed)
COVID-19 Pre-Screening Questions:   ?   In the past 7 to 10 days have you had a cough, shortness of breath, headache, congestion, fever (100 or greater) body aches, chills, sore throat, or sudden loss of taste or sense of smell?  No  Have you been around anyone with known Covid 19.NO  Have you been around anyone who is awaiting Covid 19 test results in the past 7 to 10 days? No  Have you been around anyone who has been exposed to Covid 19, or has mentioned symptoms of Covid 19 within the past 7 to 10 days? NO   If you have any concerns/questions about symptoms patients report during screening (either on the phone or at threshold). Contact the provider seeing the patient or DOD for further guidance. If neither are available contact a member of the leadership team."      

## 2019-02-28 ENCOUNTER — Telehealth: Payer: Self-pay | Admitting: Interventional Cardiology

## 2019-02-28 LAB — PTH, INTACT AND CALCIUM
Calcium, Total (PTH): 10 mg/dL (ref 8.6–10.2)
PTH: >5000 pg/mL — ABNORMAL HIGH (ref 15–65)

## 2019-02-28 NOTE — Telephone Encounter (Signed)
Spoke with the pt and he says he was already asked the COVID screening questions for his in office appts tomorrow and has not been out of the house since then and nothing has changed. Will wear his mask to the office.

## 2019-02-28 NOTE — Telephone Encounter (Signed)
Follow up    Pt is returning call.  He says he doesn't know why someone is calling but he said if they miss the call to please leave a message

## 2019-02-28 NOTE — Telephone Encounter (Signed)
F/U Message               Patient is returning April Garrison's call would like a call back. Patient would like for you to leave a message if he doesn't answer Pls

## 2019-02-28 NOTE — Telephone Encounter (Signed)
[name] is not available today.

## 2019-03-01 ENCOUNTER — Ambulatory Visit (HOSPITAL_COMMUNITY): Payer: Medicare Other | Attending: Cardiovascular Disease

## 2019-03-01 ENCOUNTER — Other Ambulatory Visit: Payer: Self-pay

## 2019-03-01 ENCOUNTER — Encounter (INDEPENDENT_AMBULATORY_CARE_PROVIDER_SITE_OTHER): Payer: Self-pay

## 2019-03-01 ENCOUNTER — Ambulatory Visit (INDEPENDENT_AMBULATORY_CARE_PROVIDER_SITE_OTHER): Payer: Medicare Other | Admitting: Interventional Cardiology

## 2019-03-01 ENCOUNTER — Encounter: Payer: Self-pay | Admitting: Interventional Cardiology

## 2019-03-01 ENCOUNTER — Other Ambulatory Visit: Payer: Medicare Other | Admitting: *Deleted

## 2019-03-01 VITALS — BP 122/72 | HR 58 | Ht 68.0 in | Wt 199.0 lb

## 2019-03-01 DIAGNOSIS — R0600 Dyspnea, unspecified: Secondary | ICD-10-CM

## 2019-03-01 DIAGNOSIS — N186 End stage renal disease: Secondary | ICD-10-CM | POA: Diagnosis not present

## 2019-03-01 DIAGNOSIS — I5032 Chronic diastolic (congestive) heart failure: Secondary | ICD-10-CM

## 2019-03-01 DIAGNOSIS — I10 Essential (primary) hypertension: Secondary | ICD-10-CM | POA: Diagnosis not present

## 2019-03-01 DIAGNOSIS — Z7189 Other specified counseling: Secondary | ICD-10-CM

## 2019-03-01 DIAGNOSIS — I484 Atypical atrial flutter: Secondary | ICD-10-CM

## 2019-03-01 LAB — BASIC METABOLIC PANEL
BUN/Creatinine Ratio: 21 (ref 10–24)
BUN: 51 mg/dL — ABNORMAL HIGH (ref 8–27)
CO2: 20 mmol/L (ref 20–29)
Calcium: 9.3 mg/dL (ref 8.6–10.2)
Chloride: 108 mmol/L — ABNORMAL HIGH (ref 96–106)
Creatinine, Ser: 2.42 mg/dL — ABNORMAL HIGH (ref 0.76–1.27)
GFR calc Af Amer: 30 mL/min/{1.73_m2} — ABNORMAL LOW (ref >59)
GFR calc non Af Amer: 26 mL/min/{1.73_m2} — ABNORMAL LOW (ref >59)
Glucose: 141 mg/dL — ABNORMAL HIGH (ref 65–99)
Potassium: 4.5 mmol/L (ref 3.5–5.2)
Sodium: 143 mmol/L (ref 134–144)

## 2019-03-01 LAB — PRO B NATRIURETIC PEPTIDE: NT-Pro BNP: 2013 pg/mL — ABNORMAL HIGH (ref 0–376)

## 2019-03-01 NOTE — Patient Instructions (Signed)
Medication Instructions:  Your physician recommends that you continue on your current medications as directed. Please refer to the Current Medication list given to you today.  If you need a refill on your cardiac medications before your next appointment, please call your pharmacy.   Lab work: None If you have labs (blood work) drawn today and your tests are completely normal, you will receive your results only by: Marland Kitchen MyChart Message (if you have MyChart) OR . A paper copy in the mail If you have any lab test that is abnormal or we need to change your treatment, we will call you to review the results.  Testing/Procedures: None  Follow-Up: Will depend on results of echo and labs  Any Other Special Instructions Will Be Listed Below (If Applicable).  Your physician recommends that you limit your fluids to 1.5 liters per day.

## 2019-03-01 NOTE — Progress Notes (Signed)
Cardiology Office Note:    Date:  03/01/2019   ID:  [name], DOB [birth year], MRN [id number]  PCP:  [name], MD  Cardiologist:  No primary care provider on file.   Referring MD: [name], MD   Chief Complaint  Patient presents with  . Shortness of Breath    History of Present Illness:    [name] is a 73 y.o. 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  For the past several months, [name] has noted dyspnea on exertion, lower extremity swelling, and orthopnea.  This summer he has difficulty pushing his lawn more whereas last year it was not as much of a struggle.  He has mostly been in during the pandemic.  He is eating out a lot.  He feels salt and fluid restriction guidelines may not have been met.  He is not having chest pain.  He is compliant with his medical regimen.  He is on furosemide 60 mg/day.  Dr.Colodonato increase that to 80 mg/day for several days with some improvement in fluid accumulation and breathing.  He contacted our office because he became worried that perhaps there was a significant problem with his heart.   Past Medical History:  Diagnosis Date  . Anemia   . Atrial fibrillation ([place])   . Chronic kidney disease   . Diabetes mellitus   . Dysrhythmia   . Hyperlipidemia   . Neuropathy   . Wound of left leg 08/2018    Past Surgical History:  Procedure Laterality Date  . access proceds    . achlles tendon repair    . ARTERIOVENOUS GRAFT PLACEMENT    . bowel repla     repair  . [place] and 2005  . LIGATION GORETEX GRAFT  06/30/11   Right upper arm    Current Medications: Current Meds  Medication Sig  . acetaminophen (TYLENOL) 500 MG tablet Take 1,000 mg by mouth every 8 (eight) hours as  needed for moderate pain.   . Alcohol Swabs (B-D SINGLE USE SWABS REGULAR) PADS Use as directed  . aspirin 81 MG tablet Take 81 mg by mouth daily.    Marland Kitchen atorvastatin (LIPITOR) 10 MG tablet Take 10 mg by mouth daily.  Marland Kitchen BAYER CONTOUR NEXT TEST test strip Use as directed  . BD PEN NEEDLE NANO U/F 32G X 4 MM MISC Use as directed  . calcitRIOL (ROCALTROL) 0.25 MCG capsule Take 0.25 mcg by mouth daily. daily  . carvedilol (COREG) 6.25 MG tablet Take 1 tablet (6.25 mg total) by mouth 2 (two) times daily with a meal.  . diphenhydrAMINE (BENADRYL) 25 mg capsule Take 1 capsule (25 mg total) by mouth at bedtime as needed (for sleep/allergies.).  Marland Kitchen furosemide (LASIX) 40 MG tablet Take 60 mg by mouth daily. Fluid.  Marland Kitchen gabapentin (NEURONTIN) 100 MG capsule Take 100 mg by mouth 3 (three) times daily as needed (for nerve pain). Take three times/day as needed  . HUMALOG KWIKPEN 100 UNIT/ML KwikPen Inject 3-5 Units into the skin 3 (three) times daily.  . hydrALAZINE (APRESOLINE) 25 MG tablet Take 25 mg by mouth 3 (three) times daily.   Marland Kitchen LANTUS SOLOSTAR 100 UNIT/ML Solostar Pen Inject 7-15 Units into the skin daily. If blood sugars are >300, use 15 units, <300 use 7 units  . latanoprost (  XALATAN) 0.005 % ophthalmic solution Place 1 drop into both eyes at bedtime.  Marland Kitchen linagliptin (TRADJENTA) 5 MG TABS tablet Take 5 mg by mouth daily.    Marland Kitchen MICROLET LANCETS MISC Use as directed  . mycophenolate (CELLCEPT) 500 MG tablet Take 500 mg by mouth 2 (two) times daily.   . predniSONE (DELTASONE) 5 MG tablet Take 5 mg by mouth daily.  . tacrolimus (PROGRAF) 1 MG capsule Take 1 mg by mouth 2 (two) times daily.   Marland Kitchen VASCEPA 1 g CAPS Take 4 g by mouth daily.     Allergies:   Elavil [amitriptyline hcl]   Social History   Socioeconomic History  . Marital status: Married    Spouse name: Not on file  . Number of children: Not on file  . Years of education: Not on file  . Highest education level: Not on file  Occupational  History  . Not on file  Social Needs  . Financial resource strain: Not on file  . Food insecurity    Worry: Not on file    Inability: Not on file  . Transportation needs    Medical: Not on file    Non-medical: Not on file  Tobacco Use  . Smoking status: Never Smoker  . Smokeless tobacco: Never Used  Substance and Sexual Activity  . Alcohol use: No    Alcohol/week: 0.0 standard drinks  . Drug use: No  . Sexual activity: Not on file  Lifestyle  . Physical activity    Days per week: Not on file    Minutes per session: Not on file  . Stress: Not on file  Relationships  . Social Herbalist on phone: Not on file    Gets together: Not on file    Attends religious service: Not on file    Active member of club or organization: Not on file    Attends meetings of clubs or organizations: Not on file    Relationship status: Not on file  Other Topics Concern  . Not on file  Social History Narrative  . Not on file     Family History: The patient's family history includes Heart attack in his father; Heart disease in his mother; Stroke in his sister.  ROS:   Please see the history of present illness.    Appetite is stable.  Eating out a lot.  Stopped working because of concerns about risk if he contracts coronavirus.  All other systems reviewed and are negative.  EKGs/Labs/Other Studies Reviewed:    The following studies were reviewed today: No data yet available.  Echo is pending.  EKG:  EKG performed September 06, 2018 revealed atrial flutter with variable AV conduction.  Low voltage.  No significant change when compared to prior studies.  Recent Labs: 09/05/2018: ALT 13 09/06/2018: TSH 2.422 09/10/2018: Platelets 107 09/11/2018: BUN 44; Creatinine, Ser 1.80; Potassium 3.9; Sodium 140 02/27/2019: Hemoglobin 9.9  Recent Lipid Panel No results found for: CHOL, TRIG, HDL, CHOLHDL, VLDL, LDLCALC, LDLDIRECT  Physical Exam:    VS:  BP 122/72   Pulse (!) 58   Ht _0   (1.727 m)   Wt 199 lb (90.3 kg)   SpO2 97%   BMI 30.26 kg/m     Wt Readings from Last 3 Encounters:  03/01/19 199 lb (90.3 kg)  12/27/18 191 lb 12.8 oz (87 kg)  09/11/18 177 lb 4 oz (80.4 kg)     GEN: Elderly and chronically ill appearing appears  older than stated age.. No acute distress HEENT: Normal NECK: Marked JVD with the patient sitting with prominent Y descent.  Waveforms suggest the possibility of pericardial disease or right heart failure.   LYMPHATICS: No lymphadenopathy CARDIAC: IIRR.  No murmur, no gallop, 1-2+ bilateral symmetrical ankle to mid shin edema VASCULAR: 2+ radial pulses, no bruits RESPIRATORY:  Clear to auscultation without rales, wheezing or rhonchi  ABDOMEN: Soft, non-tender, non-distended, No pulsatile mass, MUSCULOSKELETAL: No deformity  SKIN: Ecchymoses noted on arms NEUROLOGIC:  Alert and oriented x 3 PSYCHIATRIC:  Normal affect   ASSESSMENT:    1. Dyspnea, unspecified type   2. Chronic diastolic heart failure ([place])   3. Essential hypertension   4. Atypical atrial flutter ([place])   5. ESRD (end stage renal disease) ([place])   6. Educated About Covid-19 Virus Infection    PLAN:    In order of problems listed above:  1. The patient's dyspnea in conjunction with obvious clinical evidence of volume overload confirmed that dyspnea has at least a component of congestive heart failure.  An echo from today is pending.  We will determine if LV systolic function is still intact.  The clinical exam today suggest pulmonary hypertension and possibly right heart failure as well.  In a chronic kidney patient pericardial disease also needs to be ruled out. 2. Suspect he has significant diastolic dysfunction.  The echo will help to exclude systolic component. 3. Target blood pressure 130/80 mmHg or less. 4. Chronically present with good rate control 5. Stage IV kidney function, status post transplantation x2.  Guideline directed therapy for left ventricular  systolic dysfunction: Angiotensin receptor-neprilysin inhibitor (ARNI)-Entresto; beta-blocker therapy - carvedilol or metoprolol succinate; mineralocorticoid receptor antagonist (MRA) therapy -spironolactone or eplerenone.  These therapies have been shown to improve clinical outcomes including reduction of rehospitalization survival, and acute heart failure. We are limited in our attempts to treat heart failure, systolic and/or diastolic, related to the patient's chronic kidney disease.  Renin angiotensin system blockade is not possible.  Spironolactone is not possible.  Management of heart failure will mostly be related to volume control.  Discussed implementing a less than 2 g/day sodium restriction and 1.5 L fluid restriction.  Additionally if there is not improvement in volume overload with dietary measures, will need to increase loop diuretic intensity.  I do not want to be primarily responsible for this and will coordinate with Dr. Arty Baumgartner.    Medication Adjustments/Labs and Tests Ordered: Current medicines are reviewed at length with the patient today.  Concerns regarding medicines are outlined above.  No orders of the defined types were placed in this encounter.  No orders of the defined types were placed in this encounter.   Patient Instructions  Medication Instructions:  Your physician recommends that you continue on your current medications as directed. Please refer to the Current Medication list given to you today.  If you need a refill on your cardiac medications before your next appointment, please call your pharmacy.   Lab work: None If you have labs (blood work) drawn today and your tests are completely normal, you will receive your results only by: Marland Kitchen MyChart Message (if you have MyChart) OR . A paper copy in the mail If you have any lab test that is abnormal or we need to change your treatment, we will call you to review the results.  Testing/Procedures: None   Follow-Up: Will depend on results of echo and labs  Any Other Special Instructions Will Be Listed Below (  If Applicable).  Your physician recommends that you limit your fluids to 1.5 liters per day.      Signed, Sinclair Grooms, MD  03/01/2019 12:16 PM    [place]

## 2019-03-02 ENCOUNTER — Telehealth: Payer: Self-pay | Admitting: Interventional Cardiology

## 2019-03-02 NOTE — Telephone Encounter (Signed)
New Message    Patient would like someone to call him back with Echo results.

## 2019-03-02 NOTE — Telephone Encounter (Signed)
Spoke with pt about labs and echo results.

## 2019-03-26 DIAGNOSIS — N186 End stage renal disease: Secondary | ICD-10-CM | POA: Diagnosis not present

## 2019-03-26 DIAGNOSIS — E785 Hyperlipidemia, unspecified: Secondary | ICD-10-CM | POA: Diagnosis not present

## 2019-03-26 DIAGNOSIS — N189 Chronic kidney disease, unspecified: Secondary | ICD-10-CM | POA: Diagnosis not present

## 2019-03-26 DIAGNOSIS — Z940 Kidney transplant status: Secondary | ICD-10-CM | POA: Diagnosis not present

## 2019-03-27 ENCOUNTER — Other Ambulatory Visit: Payer: Self-pay | Admitting: Interventional Cardiology

## 2019-03-27 ENCOUNTER — Other Ambulatory Visit: Payer: Self-pay

## 2019-03-27 ENCOUNTER — Ambulatory Visit (HOSPITAL_COMMUNITY)
Admission: RE | Admit: 2019-03-27 | Discharge: 2019-03-27 | Disposition: A | Discharge status: Home / Self Care | Payer: Medicare Other | Source: Ambulatory Visit | Attending: Nephrology | Admitting: Nephrology

## 2019-03-27 DIAGNOSIS — D631 Anemia in chronic kidney disease: Secondary | ICD-10-CM | POA: Diagnosis not present

## 2019-03-27 DIAGNOSIS — N185 Chronic kidney disease, stage 5: Secondary | ICD-10-CM | POA: Insufficient documentation

## 2019-03-27 LAB — HEMOGLOBIN AND HEMATOCRIT, BLOOD
HCT: 35.4 % — ABNORMAL LOW (ref 39.0–52.0)
Hemoglobin: 10.1 g/dL — ABNORMAL LOW (ref 13.0–17.0)

## 2019-03-27 MED ORDER — DARBEPOETIN ALFA 60 MCG/0.3ML IJ SOSY
120.0000 ug | PREFILLED_SYRINGE | INTRAMUSCULAR | Status: DC
  Administered 2019-03-27: 120 ug via SUBCUTANEOUS
  Filled 2019-03-27: qty 0.6
  Filled 2019-03-27: qty 0.3

## 2019-03-27 NOTE — Progress Notes (Signed)
PATIENT CARE CENTER NOTE  Diagnosis: Anemia associated with Chronic Renal Failure   Provider: [name], MD   Procedure: Aranesp injections    Note: Patient received sub-q Aranesp injections in left arm. Tolerated injections well. No adverse reaction noted. Pre-injection, Hemoglobin 10.1 and BP 119/81. Discharge paperwork given to patient. Alert, oriented and ambulatory at discharge.

## 2019-03-27 NOTE — Discharge Instructions (Signed)
Darbepoetin Alfa injection What is this medicine? DARBEPOETIN ALFA (dar be POE e tin AL fa) helps your body make more red blood cells. It is used to treat anemia caused by chronic kidney failure and chemotherapy. This medicine may be used for other purposes; ask your health care provider or pharmacist if you have questions. COMMON BRAND NAME(S): Aranesp What should I tell my health care provider before I take this medicine? They need to know if you have any of these conditions:  blood clotting disorders or history of blood clots  cancer patient not on chemotherapy  cystic fibrosis  heart disease, such as angina, heart failure, or a history of a heart attack  hemoglobin level of 12 g/dL or greater  high blood pressure  low levels of folate, iron, or vitamin B12  seizures  an unusual or allergic reaction to darbepoetin, erythropoietin, albumin, hamster proteins, latex, other medicines, foods, dyes, or preservatives  pregnant or trying to get pregnant  breast-feeding How should I use this medicine? This medicine is for injection into a vein or under the skin. It is usually given by a health care professional in a hospital or clinic setting. If you get this medicine at home, you will be taught how to prepare and give this medicine. Use exactly as directed. Take your medicine at regular intervals. Do not take your medicine more often than directed. It is important that you put your used needles and syringes in a special sharps container. Do not put them in a trash can. If you do not have a sharps container, call your pharmacist or healthcare provider to get one. A special MedGuide will be given to you by the pharmacist with each prescription and refill. Be sure to read this information carefully each time. Talk to your pediatrician regarding the use of this medicine in children. While this medicine may be used in children as young as 1 month of age for selected conditions, precautions do  apply. Overdosage: If you think you have taken too much of this medicine contact a poison control center or emergency room at once. NOTE: This medicine is only for you. Do not share this medicine with others. What if I miss a dose? If you miss a dose, take it as soon as you can. If it is almost time for your next dose, take only that dose. Do not take double or extra doses. What may interact with this medicine? Do not take this medicine with any of the following medications:  epoetin alfa This list may not describe all possible interactions. Give your health care provider a list of all the medicines, herbs, non-prescription drugs, or dietary supplements you use. Also tell them if you smoke, drink alcohol, or use illegal drugs. Some items may interact with your medicine. What should I watch for while using this medicine? Your condition will be monitored carefully while you are receiving this medicine. You may need blood work done while you are taking this medicine. This medicine may cause a decrease in vitamin B6. You should make sure that you get enough vitamin B6 while you are taking this medicine. Discuss the foods you eat and the vitamins you take with your health care professional. What side effects may I notice from receiving this medicine? Side effects that you should report to your doctor or health care professional as soon as possible:  allergic reactions like skin rash, itching or hives, swelling of the face, lips, or tongue  breathing problems  changes in   vision  chest pain  confusion, trouble speaking or understanding  feeling faint or lightheaded, falls  high blood pressure  muscle aches or pains  pain, swelling, warmth in the leg  rapid weight gain  severe headaches  sudden numbness or weakness of the face, arm or leg  trouble walking, dizziness, loss of balance or coordination  seizures (convulsions)  swelling of the ankles, feet, hands  unusually weak or  tired Side effects that usually do not require medical attention (report to your doctor or health care professional if they continue or are bothersome):  diarrhea  fever, chills (flu-like symptoms)  headaches  nausea, vomiting  redness, stinging, or swelling at site where injected This list may not describe all possible side effects. Call your doctor for medical advice about side effects. You may report side effects to FDA at 1-800-FDA-1088. Where should I keep my medicine? Keep out of the reach of children. Store in a refrigerator between 2 and 8 degrees C (36 and 46 degrees F). Do not freeze. Do not shake. Throw away any unused portion if using a single-dose vial. Throw away any unused medicine after the expiration date. NOTE: This sheet is a summary. It may not cover all possible information. If you have questions about this medicine, talk to your doctor, pharmacist, or health care provider.  2020 Elsevier/Gold Standard (2017-09-21 16:44:20)  

## 2019-03-28 DIAGNOSIS — Z79899 Other long term (current) drug therapy: Secondary | ICD-10-CM | POA: Diagnosis not present

## 2019-03-28 DIAGNOSIS — E785 Hyperlipidemia, unspecified: Secondary | ICD-10-CM | POA: Diagnosis not present

## 2019-03-28 DIAGNOSIS — N2581 Secondary hyperparathyroidism of renal origin: Secondary | ICD-10-CM | POA: Diagnosis not present

## 2019-03-28 DIAGNOSIS — D631 Anemia in chronic kidney disease: Secondary | ICD-10-CM | POA: Diagnosis not present

## 2019-03-28 DIAGNOSIS — I5032 Chronic diastolic (congestive) heart failure: Secondary | ICD-10-CM | POA: Diagnosis not present

## 2019-03-28 DIAGNOSIS — I739 Peripheral vascular disease, unspecified: Secondary | ICD-10-CM | POA: Diagnosis not present

## 2019-03-28 DIAGNOSIS — Z940 Kidney transplant status: Secondary | ICD-10-CM | POA: Diagnosis not present

## 2019-03-28 DIAGNOSIS — N189 Chronic kidney disease, unspecified: Secondary | ICD-10-CM | POA: Diagnosis not present

## 2019-03-28 DIAGNOSIS — E118 Type 2 diabetes mellitus with unspecified complications: Secondary | ICD-10-CM | POA: Diagnosis not present

## 2019-03-28 DIAGNOSIS — I129 Hypertensive chronic kidney disease with stage 1 through stage 4 chronic kidney disease, or unspecified chronic kidney disease: Secondary | ICD-10-CM | POA: Diagnosis not present

## 2019-03-28 DIAGNOSIS — N179 Acute kidney failure, unspecified: Secondary | ICD-10-CM | POA: Diagnosis not present

## 2019-03-28 DIAGNOSIS — R0601 Orthopnea: Secondary | ICD-10-CM | POA: Diagnosis not present

## 2019-04-25 ENCOUNTER — Ambulatory Visit (HOSPITAL_COMMUNITY)
Admission: RE | Admit: 2019-04-25 | Discharge: 2019-04-25 | Disposition: A | Discharge status: Home / Self Care | Payer: Medicare Other | Source: Ambulatory Visit | Attending: Nephrology | Admitting: Nephrology

## 2019-04-25 ENCOUNTER — Other Ambulatory Visit: Payer: Self-pay

## 2019-04-25 DIAGNOSIS — N185 Chronic kidney disease, stage 5: Secondary | ICD-10-CM | POA: Diagnosis not present

## 2019-04-25 DIAGNOSIS — D631 Anemia in chronic kidney disease: Secondary | ICD-10-CM | POA: Diagnosis not present

## 2019-04-25 LAB — HEMOGLOBIN AND HEMATOCRIT, BLOOD
HCT: 34.7 % — ABNORMAL LOW (ref 39.0–52.0)
Hemoglobin: 10.2 g/dL — ABNORMAL LOW (ref 13.0–17.0)

## 2019-04-25 MED ORDER — DARBEPOETIN ALFA 150 MCG/0.3ML IJ SOSY: 120.0000 ug | PREFILLED_SYRINGE | Freq: Once | INTRAMUSCULAR | Status: DC

## 2019-04-25 MED ORDER — DARBEPOETIN ALFA 60 MCG/0.3ML IJ SOSY
120.0000 ug | PREFILLED_SYRINGE | Freq: Once | INTRAMUSCULAR | Status: AC
  Administered 2019-04-25: 120 ug via SUBCUTANEOUS
  Filled 2019-04-25: qty 0.6

## 2019-04-25 NOTE — Progress Notes (Signed)
                   Patient Care Center Note   Diagnosis: Anemia associated with Chronic Renal Failure    Provider: [name], MD     Procedure: Labs and Aranesp injection    Note:  Pre injection hemoglobin 10.2. Per order patient received sub-q Aranesp injection in left arm. Tolerated injection well. No adverse reaction noted. Discharge paperwork given. Patient alert, oriented and ambulatory at discharge.  Otho Bellows, RN

## 2019-05-06 NOTE — Progress Notes (Signed)
Cardiology Office Note:    Date:  05/07/2019   ID:  [name], DOB 1946-05-21, MRN [id number]  PCP:  [name], MD  Cardiologist:  No primary care provider on file.   Referring MD: [name], MD   Chief Complaint  Patient presents with  . Coronary Artery Disease  . Follow-up  . Congestive Heart Failure    History of Present Illness:    [name] is a 73 y.o. 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  Since adjustment in diuretic therapy, the patient has improved and dyspnea on exertion/orthopnea are much less bothersome although still present.  He is having no angina.  He denies orthopnea.  He is seen Dr. Arty Baumgartner  who had no objection to the current diuretic dose.  We discussed the genesis of his shortness of breath and fluid retention being on a combination of diastolic heart failure and chronic kidney disease with salt and water retention.  Past Medical History:  Diagnosis Date  . Anemia   . Atrial fibrillation ([place])   . Chronic kidney disease   . Diabetes mellitus   . Dysrhythmia   . Hyperlipidemia   . Neuropathy   . Wound of left leg 08/2018    Past Surgical History:  Procedure Laterality Date  . access proceds    . achlles tendon repair    . ARTERIOVENOUS GRAFT PLACEMENT    . bowel repla     repair  . [place] and 2005  . LIGATION GORETEX GRAFT  06/30/11   Right upper arm    Current Medications: Current Meds  Medication Sig  . acetaminophen (TYLENOL) 500 MG tablet Take 1,000 mg by mouth every 8 (eight) hours as needed for moderate pain.   . Alcohol Swabs (B-D SINGLE USE SWABS REGULAR) PADS Use as directed  . aspirin 81 MG tablet Take 81 mg by mouth daily.    Marland Kitchen atorvastatin (LIPITOR) 10 MG tablet Take 10  mg by mouth daily.  Marland Kitchen BAYER CONTOUR NEXT TEST test strip Use as directed  . BD PEN NEEDLE NANO U/F 32G X 4 MM MISC Use as directed  . calcitRIOL (ROCALTROL) 0.25 MCG capsule Take 0.25 mcg by mouth daily. daily  . carvedilol (COREG) 6.25 MG tablet TAKE 1 TABLET TWICE A DAY WITH MEALS (PLEASE CALL [phone] TO SET UP YEARLY APPOINTMENT WITH DR Tamala Julian FOR FURTHER REFILLS. THANK YOU)  . diphenhydrAMINE (BENADRYL) 25 mg capsule Take 1 capsule (25 mg total) by mouth at bedtime as needed (for sleep/allergies.).  Marland Kitchen furosemide (LASIX) 40 MG tablet Take 80 mg by mouth daily. Fluid.  Marland Kitchen gabapentin (NEURONTIN) 100 MG capsule Take 100 mg by mouth 3 (three) times daily as needed (for nerve pain). Take three times/day as needed  . HUMALOG KWIKPEN 100 UNIT/ML KwikPen Inject 3-5 Units into the skin 3 (three) times daily.  . hydrALAZINE (APRESOLINE) 25 MG tablet Take 25 mg by mouth 3 (three) times daily.   Marland Kitchen LANTUS SOLOSTAR 100 UNIT/ML Solostar Pen Inject 7-15 Units into the skin daily. If blood sugars are >300, use 15 units, <300 use 7 units  . latanoprost (XALATAN) 0.005 % ophthalmic solution Place 1 drop into both eyes at bedtime.  Marland Kitchen linagliptin (TRADJENTA) 5 MG TABS tablet Take 5 mg by mouth daily.    Marland Kitchen MICROLET LANCETS MISC  Use as directed  . mycophenolate (CELLCEPT) 500 MG tablet Take 500 mg by mouth 2 (two) times daily.   . predniSONE (DELTASONE) 5 MG tablet Take 5 mg by mouth daily.  . tacrolimus (PROGRAF) 1 MG capsule Take 1 mg by mouth 2 (two) times daily.   Marland Kitchen VASCEPA 1 g CAPS Take 4 g by mouth daily.     Allergies:   Elavil [amitriptyline hcl]   Social History   Socioeconomic History  . Marital status: Married    Spouse name: Not on file  . Number of children: Not on file  . Years of education: Not on file  . Highest education level: Not on file  Occupational History  . Not on file  Social Needs  . Financial resource strain: Not on file  . Food insecurity    Worry: Not on file     Inability: Not on file  . Transportation needs    Medical: Not on file    Non-medical: Not on file  Tobacco Use  . Smoking status: Never Smoker  . Smokeless tobacco: Never Used  Substance and Sexual Activity  . Alcohol use: No    Alcohol/week: 0.0 standard drinks  . Drug use: No  . Sexual activity: Not on file  Lifestyle  . Physical activity    Days per week: Not on file    Minutes per session: Not on file  . Stress: Not on file  Relationships  . Social Herbalist on phone: Not on file    Gets together: Not on file    Attends religious service: Not on file    Active member of club or organization: Not on file    Attends meetings of clubs or organizations: Not on file    Relationship status: Not on file  Other Topics Concern  . Not on file  Social History Narrative  . Not on file     Family History: The patient's family history includes Heart attack in his father; Heart disease in his mother; Stroke in his sister.  ROS:   Please see the history of present illness.    He is concerned about the possibility of COVID infection.  He has mostly been staying and and is following the 3W recommendations.  He denies cough, chills, and phlegm production.  All other systems reviewed and are negative.  EKGs/Labs/Other Studies Reviewed:    The following studies were reviewed today:  2D Doppler echocardiogram June 2020: IMPRESSIONS    1. The left ventricle has normal systolic function with an ejection fraction of 60-65%. The cavity size was normal. There is mildly increased left ventricular wall thickness. Left ventricular diastolic function could not be evaluated secondary to atrial  fibrillation.  2. The LV endocardium is very bright. Likely due to ESRD. Consider amyloidosis.  3. The right ventricle has moderately reduced systolic function. The cavity was normal. There is mildly increased right ventricular wall thickness. Right ventricular systolic pressure is  moderately elevated with an estimated pressure of 49.3 mmHg.  4. Left atrial size was mildly dilated.  5. No evidence of mitral valve stenosis.  6. Aortic valve regurgitation is mild by color flow Doppler. No stenosis of the aortic valve.  7. There is mild dilatation of the aortic root and of the ascending aorta.  8. Pulmonary hypertension is moderate.  9. The inferior vena cava was dilated in size with <50% respiratory variability. 10. The interatrial septum was not assessed.  EKG:  EKG a  repeat tracing is not performed.  Recent Labs: 09/05/2018: ALT 13 09/06/2018: TSH 2.422 09/10/2018: Platelets 107 03/01/2019: BUN 51; Creatinine, Ser 2.42; NT-Pro BNP 2,013; Potassium 4.5; Sodium 143 04/25/2019: Hemoglobin 10.2  Recent Lipid Panel No results found for: CHOL, TRIG, HDL, CHOLHDL, VLDL, LDLCALC, LDLDIRECT  Physical Exam:    VS:  BP 118/62   Pulse (!) 49   Ht 5\' 8"  (1.727 m)   Wt 199 lb (90.3 kg)   SpO2 96%   BMI 30.26 kg/m     Wt Readings from Last 3 Encounters:  05/07/19 199 lb (90.3 kg)  03/01/19 199 lb (90.3 kg)  12/27/18 191 lb 12.8 oz (87 kg)     GEN: Appears older than stated age.. No acute distress HEENT: Normal NECK: Moderate JVD while sitting. LYMPHATICS: No lymphadenopathy CARDIAC: Irregular RR with 1/6 to 2/6 systolic murmur.  No gallop, but there is trace bilateral ankle edema. VASCULAR:  Normal Pulses. No bruits. RESPIRATORY:  Clear to auscultation without rales, wheezing or rhonchi  ABDOMEN: Soft, non-tender, non-distended, No pulsatile mass, MUSCULOSKELETAL: No deformity  SKIN: Warm and dry NEUROLOGIC:  Alert and oriented x 3 PSYCHIATRIC:  Normal affect   ASSESSMENT:    1. Chronic diastolic heart failure ([place])   2. Dyspnea, unspecified type   3. Essential hypertension   4. Atypical atrial flutter ([place])   5. ESRD (end stage renal disease) ([place])   6. Educated About Covid-19 Virus Infection    PLAN:    In order of problems listed above:  1.  Improved heart failure with increased diuretic.  Still has some evidence of volume overload.  Could probably further increase intensity of diuretic regimen but will defer to Dr. Arty Baumgartner.  At this point, we should consider 80 mg a.m. dose and 40 mg p.m. dose of furosemide. 2. Improved with diuresis 3. Blood pressure is well controlled. 4. Heart rate is well controlled. 5. Follows with nephrology.  I have not changed the diuretic regimen but would like to further increase diuresis if okay. 6. Masking social distancing, and handwashing is encouraged to prevent COVID infection.  He needs routine cardiology follow-up given heart failure.  Plan clinical follow-up with myself or team member in 4 to 6 months.  Will have Dr. Edward Jolly comment on whether or not 80 mg AM and 40 mg p.m. of furosemide is reasonable.   Medication Adjustments/Labs and Tests Ordered: Current medicines are reviewed at length with the patient today.  Concerns regarding medicines are outlined above.  No orders of the defined types were placed in this encounter.  No orders of the defined types were placed in this encounter.   Patient Instructions  Medication Instructions:  Your physician recommends that you continue on your current medications as directed. Please refer to the Current Medication list given to you today.  If you need a refill on your cardiac medications before your next appointment, please call your pharmacy.   Lab work: None If you have labs (blood work) drawn today and your tests are completely normal, you will receive your results only by: Marland Kitchen MyChart Message (if you have MyChart) OR . A paper copy in the mail If you have any lab test that is abnormal or we need to change your treatment, we will call you to review the results.  Testing/Procedures: None  Follow-Up: At [facility], you and your health needs are our priority.  As part of our continuing mission to provide you with exceptional heart  care, we have  created designated Provider Care Teams.  These Care Teams include your primary Cardiologist (physician) and Advanced Practice Providers (APPs -  Physician Assistants and Nurse Practitioners) who all work together to provide you with the care you need, when you need it. You will need a follow up appointment in 6 months.  Please call our office 2 months in advance to schedule this appointment.  You may see Dr. Tamala Julian or one of the following Advanced Practice Providers on your designated Care Team:   Truitt Merle, NP Cecilie Kicks, NP . Kathyrn Drown, NP  Any Other Special Instructions Will Be Listed Below (If Applicable).       Signed, Sinclair Grooms, MD  05/07/2019 11:39 AM    [place]

## 2019-05-07 ENCOUNTER — Ambulatory Visit (INDEPENDENT_AMBULATORY_CARE_PROVIDER_SITE_OTHER): Payer: Medicare Other | Admitting: Interventional Cardiology

## 2019-05-07 ENCOUNTER — Encounter: Payer: Self-pay | Admitting: Interventional Cardiology

## 2019-05-07 ENCOUNTER — Other Ambulatory Visit: Payer: Self-pay

## 2019-05-07 VITALS — BP 118/62 | HR 49 | Ht 68.0 in | Wt 199.0 lb

## 2019-05-07 DIAGNOSIS — I5032 Chronic diastolic (congestive) heart failure: Secondary | ICD-10-CM | POA: Diagnosis not present

## 2019-05-07 DIAGNOSIS — I10 Essential (primary) hypertension: Secondary | ICD-10-CM

## 2019-05-07 DIAGNOSIS — I484 Atypical atrial flutter: Secondary | ICD-10-CM

## 2019-05-07 DIAGNOSIS — Z7189 Other specified counseling: Secondary | ICD-10-CM

## 2019-05-07 DIAGNOSIS — N186 End stage renal disease: Secondary | ICD-10-CM | POA: Diagnosis not present

## 2019-05-07 DIAGNOSIS — R0600 Dyspnea, unspecified: Secondary | ICD-10-CM | POA: Diagnosis not present

## 2019-05-07 DIAGNOSIS — I132 Hypertensive heart and chronic kidney disease with heart failure and with stage 5 chronic kidney disease, or end stage renal disease: Secondary | ICD-10-CM

## 2019-05-07 NOTE — Patient Instructions (Signed)
Medication Instructions:  Your physician recommends that you continue on your current medications as directed. Please refer to the Current Medication list given to you today.  If you need a refill on your cardiac medications before your next appointment, please call your pharmacy.   Lab work: None If you have labs (blood work) drawn today and your tests are completely normal, you will receive your results only by: . MyChart Message (if you have MyChart) OR . A paper copy in the mail If you have any lab test that is abnormal or we need to change your treatment, we will call you to review the results.  Testing/Procedures: None  Follow-Up: At [facility], you and your health needs are our priority.  As part of our continuing mission to provide you with exceptional heart care, we have created designated Provider Care Teams.  These Care Teams include your primary Cardiologist (physician) and Advanced Practice Providers (APPs -  Physician Assistants and Nurse Practitioners) who all work together to provide you with the care you need, when you need it. You will need a follow up appointment in 6 months.  Please call our office 2 months in advance to schedule this appointment.  You may see Dr. Smith or one of the following Advanced Practice Providers on your designated Care Team:   Lori Gerhardt, NP Laura Ingold, NP . Jill McDaniel, NP  Any Other Special Instructions Will Be Listed Below (If Applicable).    

## 2019-05-23 ENCOUNTER — Other Ambulatory Visit: Payer: Self-pay

## 2019-05-23 ENCOUNTER — Ambulatory Visit (HOSPITAL_COMMUNITY)
Admission: RE | Admit: 2019-05-23 | Discharge: 2019-05-23 | Disposition: A | Discharge status: Home / Self Care | Payer: Medicare Other | Source: Ambulatory Visit | Attending: Nephrology | Admitting: Nephrology

## 2019-05-23 DIAGNOSIS — D631 Anemia in chronic kidney disease: Secondary | ICD-10-CM | POA: Insufficient documentation

## 2019-05-23 DIAGNOSIS — N185 Chronic kidney disease, stage 5: Secondary | ICD-10-CM | POA: Diagnosis not present

## 2019-05-23 LAB — HEMOGLOBIN AND HEMATOCRIT, BLOOD
HCT: 34.3 % — ABNORMAL LOW (ref 39.0–52.0)
Hemoglobin: 10 g/dL — ABNORMAL LOW (ref 13.0–17.0)

## 2019-05-23 MED ORDER — DARBEPOETIN ALFA 150 MCG/0.3ML IJ SOSY
120.0000 ug | PREFILLED_SYRINGE | INTRAMUSCULAR | Status: DC
  Administered 2019-05-23: 120 ug via SUBCUTANEOUS
  Filled 2019-05-23: qty 0.3

## 2019-05-23 NOTE — Progress Notes (Signed)
PATIENT CARE CENTER NOTE  Diagnosis:Anemia associated with Chronic Renal Failure   Provider:Coladonato, Broadus John, MD   Procedure:Aranesp injections   Note:Patient received sub-q Aranesp injectionsin left arm. Tolerated injections well. No adverse reaction noted. Pre-injection, Hemoglobin 10.0and BP 142/60. Discharge paperwork given to patient. Alert, oriented and ambulatory at discharge.

## 2019-05-23 NOTE — Discharge Instructions (Signed)
Darbepoetin Alfa injection What is this medicine? DARBEPOETIN ALFA (dar be POE e tin AL fa) helps your body make more red blood cells. It is used to treat anemia caused by chronic kidney failure and chemotherapy. This medicine may be used for other purposes; ask your health care provider or pharmacist if you have questions. COMMON BRAND NAME(S): Aranesp What should I tell my health care provider before I take this medicine? They need to know if you have any of these conditions:  blood clotting disorders or history of blood clots  cancer patient not on chemotherapy  cystic fibrosis  heart disease, such as angina, heart failure, or a history of a heart attack  hemoglobin level of 12 g/dL or greater  high blood pressure  low levels of folate, iron, or vitamin B12  seizures  an unusual or allergic reaction to darbepoetin, erythropoietin, albumin, hamster proteins, latex, other medicines, foods, dyes, or preservatives  pregnant or trying to get pregnant  breast-feeding How should I use this medicine? This medicine is for injection into a vein or under the skin. It is usually given by a health care professional in a hospital or clinic setting. If you get this medicine at home, you will be taught how to prepare and give this medicine. Use exactly as directed. Take your medicine at regular intervals. Do not take your medicine more often than directed. It is important that you put your used needles and syringes in a special sharps container. Do not put them in a trash can. If you do not have a sharps container, call your pharmacist or healthcare provider to get one. A special MedGuide will be given to you by the pharmacist with each prescription and refill. Be sure to read this information carefully each time. Talk to your pediatrician regarding the use of this medicine in children. While this medicine may be used in children as young as 1 month of age for selected conditions, precautions do  apply. Overdosage: If you think you have taken too much of this medicine contact a poison control center or emergency room at once. NOTE: This medicine is only for you. Do not share this medicine with others. What if I miss a dose? If you miss a dose, take it as soon as you can. If it is almost time for your next dose, take only that dose. Do not take double or extra doses. What may interact with this medicine? Do not take this medicine with any of the following medications:  epoetin alfa This list may not describe all possible interactions. Give your health care provider a list of all the medicines, herbs, non-prescription drugs, or dietary supplements you use. Also tell them if you smoke, drink alcohol, or use illegal drugs. Some items may interact with your medicine. What should I watch for while using this medicine? Your condition will be monitored carefully while you are receiving this medicine. You may need blood work done while you are taking this medicine. This medicine may cause a decrease in vitamin B6. You should make sure that you get enough vitamin B6 while you are taking this medicine. Discuss the foods you eat and the vitamins you take with your health care professional. What side effects may I notice from receiving this medicine? Side effects that you should report to your doctor or health care professional as soon as possible:  allergic reactions like skin rash, itching or hives, swelling of the face, lips, or tongue  breathing problems  changes in   vision  chest pain  confusion, trouble speaking or understanding  feeling faint or lightheaded, falls  high blood pressure  muscle aches or pains  pain, swelling, warmth in the leg  rapid weight gain  severe headaches  sudden numbness or weakness of the face, arm or leg  trouble walking, dizziness, loss of balance or coordination  seizures (convulsions)  swelling of the ankles, feet, hands  unusually weak or  tired Side effects that usually do not require medical attention (report to your doctor or health care professional if they continue or are bothersome):  diarrhea  fever, chills (flu-like symptoms)  headaches  nausea, vomiting  redness, stinging, or swelling at site where injected This list may not describe all possible side effects. Call your doctor for medical advice about side effects. You may report side effects to FDA at 1-800-FDA-1088. Where should I keep my medicine? Keep out of the reach of children. Store in a refrigerator between 2 and 8 degrees C (36 and 46 degrees F). Do not freeze. Do not shake. Throw away any unused portion if using a single-dose vial. Throw away any unused medicine after the expiration date. NOTE: This sheet is a summary. It may not cover all possible information. If you have questions about this medicine, talk to your doctor, pharmacist, or health care provider.  2020 Elsevier/Gold Standard (2017-09-21 16:44:20)  

## 2019-05-31 DIAGNOSIS — Z940 Kidney transplant status: Secondary | ICD-10-CM | POA: Diagnosis not present

## 2019-05-31 DIAGNOSIS — I10 Essential (primary) hypertension: Secondary | ICD-10-CM | POA: Diagnosis not present

## 2019-05-31 DIAGNOSIS — G609 Hereditary and idiopathic neuropathy, unspecified: Secondary | ICD-10-CM | POA: Diagnosis not present

## 2019-05-31 DIAGNOSIS — E7800 Pure hypercholesterolemia, unspecified: Secondary | ICD-10-CM | POA: Diagnosis not present

## 2019-05-31 DIAGNOSIS — E1165 Type 2 diabetes mellitus with hyperglycemia: Secondary | ICD-10-CM | POA: Diagnosis not present

## 2019-06-22 ENCOUNTER — Ambulatory Visit (HOSPITAL_COMMUNITY)
Admission: RE | Admit: 2019-06-22 | Discharge: 2019-06-22 | Disposition: A | Discharge status: Home / Self Care | Payer: Medicare Other | Source: Ambulatory Visit | Attending: Nephrology | Admitting: Nephrology

## 2019-06-22 DIAGNOSIS — N185 Chronic kidney disease, stage 5: Secondary | ICD-10-CM | POA: Diagnosis not present

## 2019-06-22 DIAGNOSIS — D631 Anemia in chronic kidney disease: Secondary | ICD-10-CM | POA: Insufficient documentation

## 2019-06-22 LAB — HEMOGLOBIN AND HEMATOCRIT, BLOOD
HCT: 34.2 % — ABNORMAL LOW (ref 39.0–52.0)
Hemoglobin: 10.1 g/dL — ABNORMAL LOW (ref 13.0–17.0)

## 2019-06-22 MED ORDER — DARBEPOETIN ALFA 150 MCG/0.3ML IJ SOSY
120.0000 ug | PREFILLED_SYRINGE | Freq: Once | INTRAMUSCULAR | Status: AC
  Administered 2019-06-22: 120 ug via SUBCUTANEOUS
  Filled 2019-06-22: qty 0.3

## 2019-06-22 NOTE — Discharge Instructions (Signed)
Darbepoetin Alfa injection What is this medicine? DARBEPOETIN ALFA (dar be POE e tin AL fa) helps your body make more red blood cells. It is used to treat anemia caused by chronic kidney failure and chemotherapy. This medicine may be used for other purposes; ask your health care provider or pharmacist if you have questions. COMMON BRAND NAME(S): Aranesp What should I tell my health care provider before I take this medicine? They need to know if you have any of these conditions:  blood clotting disorders or history of blood clots  cancer patient not on chemotherapy  cystic fibrosis  heart disease, such as angina, heart failure, or a history of a heart attack  hemoglobin level of 12 g/dL or greater  high blood pressure  low levels of folate, iron, or vitamin B12  seizures  an unusual or allergic reaction to darbepoetin, erythropoietin, albumin, hamster proteins, latex, other medicines, foods, dyes, or preservatives  pregnant or trying to get pregnant  breast-feeding How should I use this medicine? This medicine is for injection into a vein or under the skin. It is usually given by a health care professional in a hospital or clinic setting. If you get this medicine at home, you will be taught how to prepare and give this medicine. Use exactly as directed. Take your medicine at regular intervals. Do not take your medicine more often than directed. It is important that you put your used needles and syringes in a special sharps container. Do not put them in a trash can. If you do not have a sharps container, call your pharmacist or healthcare provider to get one. A special MedGuide will be given to you by the pharmacist with each prescription and refill. Be sure to read this information carefully each time. Talk to your pediatrician regarding the use of this medicine in children. While this medicine may be used in children as young as 1 month of age for selected conditions, precautions do  apply. Overdosage: If you think you have taken too much of this medicine contact a poison control center or emergency room at once. NOTE: This medicine is only for you. Do not share this medicine with others. What if I miss a dose? If you miss a dose, take it as soon as you can. If it is almost time for your next dose, take only that dose. Do not take double or extra doses. What may interact with this medicine? Do not take this medicine with any of the following medications:  epoetin alfa This list may not describe all possible interactions. Give your health care provider a list of all the medicines, herbs, non-prescription drugs, or dietary supplements you use. Also tell them if you smoke, drink alcohol, or use illegal drugs. Some items may interact with your medicine. What should I watch for while using this medicine? Your condition will be monitored carefully while you are receiving this medicine. You may need blood work done while you are taking this medicine. This medicine may cause a decrease in vitamin B6. You should make sure that you get enough vitamin B6 while you are taking this medicine. Discuss the foods you eat and the vitamins you take with your health care professional. What side effects may I notice from receiving this medicine? Side effects that you should report to your doctor or health care professional as soon as possible:  allergic reactions like skin rash, itching or hives, swelling of the face, lips, or tongue  breathing problems  changes in   vision  chest pain  confusion, trouble speaking or understanding  feeling faint or lightheaded, falls  high blood pressure  muscle aches or pains  pain, swelling, warmth in the leg  rapid weight gain  severe headaches  sudden numbness or weakness of the face, arm or leg  trouble walking, dizziness, loss of balance or coordination  seizures (convulsions)  swelling of the ankles, feet, hands  unusually weak or  tired Side effects that usually do not require medical attention (report to your doctor or health care professional if they continue or are bothersome):  diarrhea  fever, chills (flu-like symptoms)  headaches  nausea, vomiting  redness, stinging, or swelling at site where injected This list may not describe all possible side effects. Call your doctor for medical advice about side effects. You may report side effects to FDA at 1-800-FDA-1088. Where should I keep my medicine? Keep out of the reach of children. Store in a refrigerator between 2 and 8 degrees C (36 and 46 degrees F). Do not freeze. Do not shake. Throw away any unused portion if using a single-dose vial. Throw away any unused medicine after the expiration date. NOTE: This sheet is a summary. It may not cover all possible information. If you have questions about this medicine, talk to your doctor, pharmacist, or health care provider.  2020 Elsevier/Gold Standard (2017-09-21 16:44:20)  

## 2019-06-22 NOTE — Progress Notes (Signed)
PATIENT CARE CENTER NOTE  Diagnosis:Anemia associated with Chronic Renal Failure   Provider:Coladonato, Broadus John, MD   Procedure:Aranesp injections   Note:Patient received sub-q Aranesp injectionsin left arm. Tolerated injections well. No adverse reaction noted. Pre-injection, Hemoglobin10.1and BP 152/72. Discharge paperwork given to patient. Alert, oriented and ambulatory at discharge.

## 2019-06-25 DIAGNOSIS — S8011XA Contusion of right lower leg, initial encounter: Secondary | ICD-10-CM | POA: Diagnosis not present

## 2019-06-25 DIAGNOSIS — M79661 Pain in right lower leg: Secondary | ICD-10-CM | POA: Diagnosis not present

## 2019-07-04 DIAGNOSIS — R0902 Hypoxemia: Secondary | ICD-10-CM | POA: Diagnosis not present

## 2019-07-04 DIAGNOSIS — E161 Other hypoglycemia: Secondary | ICD-10-CM | POA: Diagnosis not present

## 2019-07-04 DIAGNOSIS — R410 Disorientation, unspecified: Secondary | ICD-10-CM | POA: Diagnosis not present

## 2019-07-04 DIAGNOSIS — E162 Hypoglycemia, unspecified: Secondary | ICD-10-CM | POA: Diagnosis not present

## 2019-07-04 DIAGNOSIS — R404 Transient alteration of awareness: Secondary | ICD-10-CM | POA: Diagnosis not present

## 2019-07-05 DIAGNOSIS — N186 End stage renal disease: Secondary | ICD-10-CM | POA: Diagnosis not present

## 2019-07-05 DIAGNOSIS — E118 Type 2 diabetes mellitus with unspecified complications: Secondary | ICD-10-CM | POA: Diagnosis not present

## 2019-07-05 DIAGNOSIS — Z940 Kidney transplant status: Secondary | ICD-10-CM | POA: Diagnosis not present

## 2019-07-23 ENCOUNTER — Ambulatory Visit (HOSPITAL_COMMUNITY)
Admission: RE | Admit: 2019-07-23 | Discharge: 2019-07-23 | Disposition: A | Discharge status: Home / Self Care | Payer: Medicare Other | Source: Ambulatory Visit | Attending: Nephrology | Admitting: Nephrology

## 2019-07-23 ENCOUNTER — Other Ambulatory Visit: Payer: Self-pay

## 2019-07-23 DIAGNOSIS — N185 Chronic kidney disease, stage 5: Secondary | ICD-10-CM | POA: Diagnosis not present

## 2019-07-23 DIAGNOSIS — D631 Anemia in chronic kidney disease: Secondary | ICD-10-CM | POA: Diagnosis not present

## 2019-07-23 LAB — HEMOGLOBIN AND HEMATOCRIT, BLOOD
HCT: 35.1 % — ABNORMAL LOW (ref 39.0–52.0)
Hemoglobin: 10.2 g/dL — ABNORMAL LOW (ref 13.0–17.0)

## 2019-07-23 MED ORDER — DARBEPOETIN ALFA 150 MCG/0.3ML IJ SOSY
120.0000 ug | PREFILLED_SYRINGE | Freq: Once | INTRAMUSCULAR | Status: AC
  Administered 2019-07-23: 10:00:00 120 ug via SUBCUTANEOUS
  Filled 2019-07-23 (×2): qty 0.3

## 2019-07-23 NOTE — Discharge Instructions (Signed)
Darbepoetin Alfa injection What is this medicine? DARBEPOETIN ALFA (dar be POE e tin AL fa) helps your body make more red blood cells. It is used to treat anemia caused by chronic kidney failure and chemotherapy. This medicine may be used for other purposes; ask your health care provider or pharmacist if you have questions. COMMON BRAND NAME(S): Aranesp What should I tell my health care provider before I take this medicine? They need to know if you have any of these conditions:  blood clotting disorders or history of blood clots  cancer patient not on chemotherapy  cystic fibrosis  heart disease, such as angina, heart failure, or a history of a heart attack  hemoglobin level of 12 g/dL or greater  high blood pressure  low levels of folate, iron, or vitamin B12  seizures  an unusual or allergic reaction to darbepoetin, erythropoietin, albumin, hamster proteins, latex, other medicines, foods, dyes, or preservatives  pregnant or trying to get pregnant  breast-feeding How should I use this medicine? This medicine is for injection into a vein or under the skin. It is usually given by a health care professional in a hospital or clinic setting. If you get this medicine at home, you will be taught how to prepare and give this medicine. Use exactly as directed. Take your medicine at regular intervals. Do not take your medicine more often than directed. It is important that you put your used needles and syringes in a special sharps container. Do not put them in a trash can. If you do not have a sharps container, call your pharmacist or healthcare provider to get one. A special MedGuide will be given to you by the pharmacist with each prescription and refill. Be sure to read this information carefully each time. Talk to your pediatrician regarding the use of this medicine in children. While this medicine may be used in children as young as 1 month of age for selected conditions, precautions do  apply. Overdosage: If you think you have taken too much of this medicine contact a poison control center or emergency room at once. NOTE: This medicine is only for you. Do not share this medicine with others. What if I miss a dose? If you miss a dose, take it as soon as you can. If it is almost time for your next dose, take only that dose. Do not take double or extra doses. What may interact with this medicine? Do not take this medicine with any of the following medications:  epoetin alfa This list may not describe all possible interactions. Give your health care provider a list of all the medicines, herbs, non-prescription drugs, or dietary supplements you use. Also tell them if you smoke, drink alcohol, or use illegal drugs. Some items may interact with your medicine. What should I watch for while using this medicine? Your condition will be monitored carefully while you are receiving this medicine. You may need blood work done while you are taking this medicine. This medicine may cause a decrease in vitamin B6. You should make sure that you get enough vitamin B6 while you are taking this medicine. Discuss the foods you eat and the vitamins you take with your health care professional. What side effects may I notice from receiving this medicine? Side effects that you should report to your doctor or health care professional as soon as possible:  allergic reactions like skin rash, itching or hives, swelling of the face, lips, or tongue  breathing problems  changes in   vision  chest pain  confusion, trouble speaking or understanding  feeling faint or lightheaded, falls  high blood pressure  muscle aches or pains  pain, swelling, warmth in the leg  rapid weight gain  severe headaches  sudden numbness or weakness of the face, arm or leg  trouble walking, dizziness, loss of balance or coordination  seizures (convulsions)  swelling of the ankles, feet, hands  unusually weak or  tired Side effects that usually do not require medical attention (report to your doctor or health care professional if they continue or are bothersome):  diarrhea  fever, chills (flu-like symptoms)  headaches  nausea, vomiting  redness, stinging, or swelling at site where injected This list may not describe all possible side effects. Call your doctor for medical advice about side effects. You may report side effects to FDA at 1-800-FDA-1088. Where should I keep my medicine? Keep out of the reach of children. Store in a refrigerator between 2 and 8 degrees C (36 and 46 degrees F). Do not freeze. Do not shake. Throw away any unused portion if using a single-dose vial. Throw away any unused medicine after the expiration date. NOTE: This sheet is a summary. It may not cover all possible information. If you have questions about this medicine, talk to your doctor, pharmacist, or health care provider.  2020 Elsevier/Gold Standard (2017-09-21 16:44:20)  

## 2019-07-23 NOTE — Progress Notes (Signed)
Patient received Darbepoetin Alfa (Aranesp) sub Q in the right arm. Hemoglobin lab was drawn prior to giving the shot. Hemoglobin was 10.2.Tolerated well, discharge instructions given, verbalized understanding. Patient alert, oriented and ambulatory at the time of discharge.

## 2019-07-31 ENCOUNTER — Other Ambulatory Visit: Payer: Self-pay

## 2019-08-01 DIAGNOSIS — L858 Other specified epidermal thickening: Secondary | ICD-10-CM | POA: Diagnosis not present

## 2019-08-02 ENCOUNTER — Other Ambulatory Visit: Payer: Self-pay | Admitting: *Deleted

## 2019-08-02 NOTE — Patient Outreach (Signed)
[facility]) Care Management  08/02/2019  [name] 1946-05-02 [id number]   Telephone Screen  Referral Date:  07/31/2019 Referral Source:  EMMI Prevent Reason for Referral:  Screening Insurance:  Medicare   Outreach Attempt:  Successful telephone outreach to patient for telephone screening.  HIPAA verified with patient.  [facility] services reviewed and discussed.  Patient states he has wound to his leg and can't get appointment to wound clinic until November 30.  He would like someone to come out to look at his wound.  Confirms he does not have a primary care provider.  Offered to assist him with finding primary care provider and patient states he already has one in mind and will do it himself.  Explained to patient he would need orders for home health to come to his home and address his leg wound and the home health agency would need orders from a physician.  Patient stated his understanding and again stated he would find a primary care provider.  Informed patient he would need [facility] in network provider to enroll in services.  Agreeable to have [facility] brochure mailed.  Encouraged patient to contact [facility] in the future if he has primary care provider and is interested in services in the future.  Plan: RN Health Coach will close case and make patient inactive due to patient not being eligible due to not having primary care provider. RN Health Coach will mail patient Successful Outreach Letter with [place].  [place] [phone] [name]

## 2019-08-06 ENCOUNTER — Encounter (HOSPITAL_BASED_OUTPATIENT_CLINIC_OR_DEPARTMENT_OTHER): Payer: Medicare Other | Attending: Internal Medicine | Admitting: Internal Medicine

## 2019-08-06 ENCOUNTER — Other Ambulatory Visit: Payer: Self-pay

## 2019-08-06 DIAGNOSIS — I87331 Chronic venous hypertension (idiopathic) with ulcer and inflammation of right lower extremity: Secondary | ICD-10-CM | POA: Insufficient documentation

## 2019-08-06 DIAGNOSIS — Z940 Kidney transplant status: Secondary | ICD-10-CM | POA: Diagnosis not present

## 2019-08-06 DIAGNOSIS — N189 Chronic kidney disease, unspecified: Secondary | ICD-10-CM | POA: Insufficient documentation

## 2019-08-06 DIAGNOSIS — L97212 Non-pressure chronic ulcer of right calf with fat layer exposed: Secondary | ICD-10-CM | POA: Diagnosis not present

## 2019-08-06 DIAGNOSIS — E1122 Type 2 diabetes mellitus with diabetic chronic kidney disease: Secondary | ICD-10-CM | POA: Diagnosis not present

## 2019-08-06 DIAGNOSIS — E11622 Type 2 diabetes mellitus with other skin ulcer: Secondary | ICD-10-CM | POA: Insufficient documentation

## 2019-08-06 DIAGNOSIS — L97812 Non-pressure chronic ulcer of other part of right lower leg with fat layer exposed: Secondary | ICD-10-CM | POA: Diagnosis not present

## 2019-08-06 DIAGNOSIS — I87311 Chronic venous hypertension (idiopathic) with ulcer of right lower extremity: Secondary | ICD-10-CM | POA: Diagnosis not present

## 2019-08-08 ENCOUNTER — Telehealth (HOSPITAL_COMMUNITY): Payer: Self-pay | Admitting: *Deleted

## 2019-08-08 DIAGNOSIS — L97812 Non-pressure chronic ulcer of other part of right lower leg with fat layer exposed: Secondary | ICD-10-CM | POA: Diagnosis not present

## 2019-08-08 DIAGNOSIS — I4891 Unspecified atrial fibrillation: Secondary | ICD-10-CM | POA: Diagnosis not present

## 2019-08-08 DIAGNOSIS — L97919 Non-pressure chronic ulcer of unspecified part of right lower leg with unspecified severity: Secondary | ICD-10-CM | POA: Diagnosis not present

## 2019-08-08 DIAGNOSIS — E1122 Type 2 diabetes mellitus with diabetic chronic kidney disease: Secondary | ICD-10-CM | POA: Diagnosis not present

## 2019-08-08 DIAGNOSIS — Z940 Kidney transplant status: Secondary | ICD-10-CM | POA: Diagnosis not present

## 2019-08-08 DIAGNOSIS — I87311 Chronic venous hypertension (idiopathic) with ulcer of right lower extremity: Secondary | ICD-10-CM | POA: Diagnosis not present

## 2019-08-08 DIAGNOSIS — N189 Chronic kidney disease, unspecified: Secondary | ICD-10-CM | POA: Diagnosis not present

## 2019-08-08 NOTE — Telephone Encounter (Signed)
The above patient or their representative was contacted and gave the following answers to these questions:         Do you have any of the following symptoms?    NO  Fever                    Cough                   Shortness of breath  Do  you have any of the following other symptoms?    muscle pain         vomiting,        diarrhea        rash         weakness        red eye        abdominal pain         bruising          bruising or bleeding              joint pain           severe headache    Have you been in contact with someone who was or has been sick in the past 2 weeks?  NO  Yes                 Unsure                         Unable to assess   Does the person that you were in contact with have any of the following symptoms? n  Cough         shortness of breath           muscle pain         vomiting,            diarrhea            rash            weakness           fever            red eye           abdominal pain           bruising  or  bleeding                joint pain                severe headache                 COMMENTS OR ACTION PLAN FOR THIS PATIENT:          

## 2019-08-09 ENCOUNTER — Other Ambulatory Visit: Payer: Self-pay

## 2019-08-09 ENCOUNTER — Other Ambulatory Visit (HOSPITAL_COMMUNITY): Payer: Self-pay | Admitting: Internal Medicine

## 2019-08-09 ENCOUNTER — Ambulatory Visit (HOSPITAL_COMMUNITY)
Admission: RE | Admit: 2019-08-09 | Discharge: 2019-08-09 | Disposition: A | Discharge status: Home / Self Care | Payer: Medicare Other | Source: Ambulatory Visit | Attending: Family | Admitting: Family

## 2019-08-09 DIAGNOSIS — L97319 Non-pressure chronic ulcer of right ankle with unspecified severity: Secondary | ICD-10-CM | POA: Insufficient documentation

## 2019-08-10 DIAGNOSIS — E785 Hyperlipidemia, unspecified: Secondary | ICD-10-CM | POA: Diagnosis not present

## 2019-08-10 DIAGNOSIS — Z79899 Other long term (current) drug therapy: Secondary | ICD-10-CM | POA: Diagnosis not present

## 2019-08-10 DIAGNOSIS — I129 Hypertensive chronic kidney disease with stage 1 through stage 4 chronic kidney disease, or unspecified chronic kidney disease: Secondary | ICD-10-CM | POA: Diagnosis not present

## 2019-08-10 DIAGNOSIS — Z23 Encounter for immunization: Secondary | ICD-10-CM | POA: Diagnosis not present

## 2019-08-10 DIAGNOSIS — N2581 Secondary hyperparathyroidism of renal origin: Secondary | ICD-10-CM | POA: Diagnosis not present

## 2019-08-10 DIAGNOSIS — Z940 Kidney transplant status: Secondary | ICD-10-CM | POA: Diagnosis not present

## 2019-08-10 DIAGNOSIS — D631 Anemia in chronic kidney disease: Secondary | ICD-10-CM | POA: Diagnosis not present

## 2019-08-10 DIAGNOSIS — I5032 Chronic diastolic (congestive) heart failure: Secondary | ICD-10-CM | POA: Diagnosis not present

## 2019-08-10 DIAGNOSIS — N189 Chronic kidney disease, unspecified: Secondary | ICD-10-CM | POA: Diagnosis not present

## 2019-08-10 DIAGNOSIS — I739 Peripheral vascular disease, unspecified: Secondary | ICD-10-CM | POA: Diagnosis not present

## 2019-08-10 DIAGNOSIS — N179 Acute kidney failure, unspecified: Secondary | ICD-10-CM | POA: Diagnosis not present

## 2019-08-10 DIAGNOSIS — E118 Type 2 diabetes mellitus with unspecified complications: Secondary | ICD-10-CM | POA: Diagnosis not present

## 2019-08-13 ENCOUNTER — Other Ambulatory Visit: Payer: Self-pay

## 2019-08-13 ENCOUNTER — Encounter (HOSPITAL_BASED_OUTPATIENT_CLINIC_OR_DEPARTMENT_OTHER): Payer: Medicare Other | Admitting: Physician Assistant

## 2019-08-13 DIAGNOSIS — I87331 Chronic venous hypertension (idiopathic) with ulcer and inflammation of right lower extremity: Secondary | ICD-10-CM | POA: Diagnosis not present

## 2019-08-13 DIAGNOSIS — Z940 Kidney transplant status: Secondary | ICD-10-CM | POA: Diagnosis not present

## 2019-08-13 DIAGNOSIS — E1122 Type 2 diabetes mellitus with diabetic chronic kidney disease: Secondary | ICD-10-CM | POA: Diagnosis not present

## 2019-08-13 DIAGNOSIS — N189 Chronic kidney disease, unspecified: Secondary | ICD-10-CM | POA: Diagnosis not present

## 2019-08-13 DIAGNOSIS — S81801A Unspecified open wound, right lower leg, initial encounter: Secondary | ICD-10-CM | POA: Diagnosis not present

## 2019-08-13 DIAGNOSIS — L97212 Non-pressure chronic ulcer of right calf with fat layer exposed: Secondary | ICD-10-CM | POA: Diagnosis not present

## 2019-08-13 DIAGNOSIS — E11622 Type 2 diabetes mellitus with other skin ulcer: Secondary | ICD-10-CM | POA: Diagnosis not present

## 2019-08-20 ENCOUNTER — Encounter (HOSPITAL_BASED_OUTPATIENT_CLINIC_OR_DEPARTMENT_OTHER): Payer: Medicare Other | Admitting: Internal Medicine

## 2019-08-20 ENCOUNTER — Ambulatory Visit (HOSPITAL_COMMUNITY)
Admission: RE | Admit: 2019-08-20 | Discharge: 2019-08-20 | Disposition: A | Discharge status: Home / Self Care | Payer: Medicare Other | Source: Ambulatory Visit | Attending: Family | Admitting: Family

## 2019-08-20 ENCOUNTER — Other Ambulatory Visit (HOSPITAL_COMMUNITY): Payer: Self-pay | Admitting: Internal Medicine

## 2019-08-20 ENCOUNTER — Other Ambulatory Visit: Payer: Self-pay

## 2019-08-20 DIAGNOSIS — E1122 Type 2 diabetes mellitus with diabetic chronic kidney disease: Secondary | ICD-10-CM | POA: Diagnosis not present

## 2019-08-20 DIAGNOSIS — N189 Chronic kidney disease, unspecified: Secondary | ICD-10-CM | POA: Diagnosis not present

## 2019-08-20 DIAGNOSIS — S81801A Unspecified open wound, right lower leg, initial encounter: Secondary | ICD-10-CM | POA: Diagnosis not present

## 2019-08-20 DIAGNOSIS — L97212 Non-pressure chronic ulcer of right calf with fat layer exposed: Secondary | ICD-10-CM | POA: Diagnosis not present

## 2019-08-20 DIAGNOSIS — M79604 Pain in right leg: Secondary | ICD-10-CM | POA: Diagnosis not present

## 2019-08-20 DIAGNOSIS — I87331 Chronic venous hypertension (idiopathic) with ulcer and inflammation of right lower extremity: Secondary | ICD-10-CM | POA: Diagnosis not present

## 2019-08-20 DIAGNOSIS — Z940 Kidney transplant status: Secondary | ICD-10-CM | POA: Diagnosis not present

## 2019-08-20 DIAGNOSIS — E11622 Type 2 diabetes mellitus with other skin ulcer: Secondary | ICD-10-CM | POA: Diagnosis not present

## 2019-08-20 NOTE — Progress Notes (Signed)
[name], [name] ([id number]) Visit Report for 08/20/2019 HPI Details Patient Name: Date of Service: [name], [name] 08/20/2019 10:30 AM Medical Record Patient Account Number: [id number] Number: Treating RN: Date of Birth/Sex: 1946/08/22 (73 y.o. Male) Other Clinician: Primary Care Provider: [name] Treating Provider/Extender:[name], [name] Referring Provider: [name] in Treatment: 2 History of Present Illness HPI Description: [name] HPI Description: 73 year old gentleman who was recently seen by his nephrologist [name], and noted to have a wound on his left lower extremity which was lacerated 2 months ago and now has reopened. The patient's left shin has a ulceration with some exudate but no evidence of infection and he was referred to [place] for further care as it was known that the patient has had some peripheral vascular disease in the past. Past medical history significant for chronic kidney disease, atrial fibrillation, diabetes mellitus,status post kidney transplant in 1983 and 2005, a week fistula graft placement, status post previous bowel surgery. 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 10/06/2015 -- x-ray of the left tibia and fibula shows no evidence of osteomyelitis. The patient has also had Doppler studies of his extremity and is awaiting the appointment with the vascular surgeon. We have not yet received these reports. 10/13/2015 -- lower extremity venous duplex reflux evaluation shows reflux in the left common femoral vein, left saphenofemoral junction and the proximal greater saphenous vein extending to the proximal calf. There is also reflux in the left proximal to mid small saphenous vein. Arterial duplex studies done showed the resting ABI was not applicable due to tibial artery medial calcification. The left ABI was 0.8 using the Doppler dorsalis pedis indicating mild  arterial occlusive disease at rest with the posterior tibial artery noted to be noncompressible. The right TBI was 1 which is normal and the left ABI was 1 which is normal. Patient has otherwise been doing fine and has been compliant with his dressings. 10/20/2015 -- He was seen by [name] recently for a vascular opinion on 10/15/2015. His left lower extremity venous insufficiency duplex study revealed GSV reflux,SS vein reflux and deep venous reflux in the common femoral vein. His ABIs were non compressible and his TBI on the right was 1.01 and on the left was 0.80. He was asked to continue with the wound care with compressive therapy followed by EVLA of the left GS vein 3 months. He recommended 20-30 mm thigh-high compression stockings and the need for a three-month trial of this. The patient had an Unna boot applied at the vascular office but he could not tolerate this with a lot of pain and issues with his toes and hence came here on Friday for removal of this and we reapplied a 2 layer compression. 11/10/2015 -- patient still has not purchased his 20-30 mm thigh-high compression stockings as prescribed by [name]. Readmission: 08/08/18 on evaluation today patient presents for readmission concerning a new injury to the left anterior lower extremity. He was previously seen in 2017 here in our clinic. He states that he has done fairly well since that point. Nonetheless he is having at this time some pain but states that he hit this on a table that fell over and actually struck his leg. This appears to have pulled back some of his skin which folded in on itself and is causing some difficulty as far as that is concerned. There does not appear to be any evidence of infection  at this time. No fevers, chills, nausea, or vomiting noted at this time. He's been using dressings on his own currently without complication. 08/15/18 on evaluation today patient actually appears to be doing somewhat  better in regard to his wound of the lower [name] when compared to the first visit last week. I had to do a much more extensive debridement at that time it does appear that I'm gonna have to perform some debridement today but it does not look to be as extensive by any means. Nonetheless fortunately he does not show any signs of infection he does have discomfort at this site. I believe based on what I'm seeing currently he may benefit from Iodoflex to help keep the wound bed clean. Patient tolerated therapy without complication. Upon evaluation today the patient actually appears to be doing excellent in regard to his left lower extremity ulcer. This is much better than the previous two visits where he had a lot of necrotic tissue around the edge of the wound simply due to the fact that again there was a significant skin tear where the edge had been cleared away prior to reattaching and being able to heal appropriately. He seems to be doing much better at this point. 08/28/18 on evaluation today the patient's wound actually does appear to be showing signs of improvement. With that being said though he is improving he would likely note even greater improvement if we were able to sharply debride the wound. Nonetheless this caused him to much discomfort he tells me. 09/04/18 on evaluation today patient actually appears to be showing signs of improvement in regard to his left lower extremity ulcer. He has been tolerating the dressing changes including the wrap although he tells me at this point that the burning does last for a couple of days even with just the Iodoflex. I was afraid that this may been part of the issue that he was having with discomfort. It does seem to be the case. Nonetheless he shows no signs of evidence of infection at this time which is good news. No fevers chills noted ADMISSION to [name] 10/05/2018 This is a patient who was cared for in 2017 and again in the  fall of this year at our sister clinic and [facility] [facility]. He actually lives in [place] in [place]. We have been dealing with an apparent traumatic area on the left anterior tibial area. This is been present for the last several months. He was supposed to be using Iodoflex Kerlix and Coban however he was hospitalized from 09/05/2018 through 09/11/2018 with delirium secondary to pneumonia. Since then he is only been putting Vaseline gauze on this without compression. He also has a more recent skin tear on the dorsal right hand that may have only happened in the last week. The patient had arterial studies done in 2017 in January which was 3 years ago. At that point he had noncompressible ABIs but really quite good TBI's both normal. Triphasic waveforms on the right monophasic at the left posterior tibial but triphasic at the left dorsalis pedis. His ABIs in our clinic today were both noncompressible 1/23; the patient has wounds on the right dorsal hand just distal to the wrist and on the left anterior lower extremity. Both of these look very healthy he is using Hydrofera Blue 1/30; left anterior lower extremity wound much smaller. Healthy looking surface. The laceration area just distal to the wrist on the dorsal hand on the right is also just about  closed I used Hydrofera Blue here 2/6; left anterior lower extremity wound is much smaller but still open. The laceration area just distal to the wrist on the dorsal hand is fully epithelialized. 2/13; the patient's anterior lower extremity wound is closed. The laceration just distal to the wrist on the dorsal hand is also fully epithelialized and closed. The patient has external compression stockings which I think are 20/30 READMISSION 08/06/2019 Mr. Sequeira is a 73 year old man with had several times previously in our clinic. He is a diabetic with a history of chronic renal insufficiency status post kidney transplant in 1983 and again in 2005. He was  then in 2017 with a laceration on the left lower extremity. He was worked up at the time with arterial studies and reflux studies. The arterial studies showed ABIs to be noncompressible but TBI's were within normal limits. I do not have the reflux studies at the moment. He was also sent here in 2019 with a left lower extremity wound and then again in 2020 with left lower extremity trauma a skin tear on the wrist. He was discharged with 20/30 stockings identified from myself that that might not be enough compression. Nevertheless he states he was wearing these fairly reliably. In September he had a fall with a substantial bruise in the area of the wound. He says he saw orthopedics and they told him there was some muscle strain sometime it later this opened into a wound. He has a fairly substantial wound on the right posterior calf. Satellite areas around this including medially and posteriorly. He has not worn his stockings since the injury Past medical history; includes chronic renal failure secondary to diabetes with kidney transplant x2, atrial fibrillation, heart failure with preserved ejection fraction, coronary artery disease. ABIs on the right in our clinic were once again noncompressible 08/13/2019 on evaluation today patient appears to be doing decently well with regard to his wound compared to last week's evaluation. Unfortunately he is still having a lot of discomfort at this point which is I think in some part due to the 3 layer compression wrap which is a little bit stronger I think for him. When he was here before we actually utilized a Kerlix and Coban wrap which he states seemed to got a little bit better. Nonetheless I think we can probably drop back to this in light of the discomfort that he had. Nonetheless the pain was not really right around the wound itself as much as it was around the ankle in particular. The Iodoflex does seem to have done well for him As the wound is  appearing somewhat better today which is excellent news. 11/30; still complaining of a lot of pain. Apparently arterial studies I ordered 2 weeks ago are below. I do not believe we have an appointment with vein and vascular as of yet; ABI Findings: +---------+------------------+-----+----------+--------+ Right Rt Pressure (mmHg)IndexWaveform Comment  +---------+------------------+-----+----------+--------+ PTA >254 1.50 monophasic  +---------+------------------+-----+----------+--------+ DP >254 1.50 monophasic  +---------+------------------+-----+----------+--------+ Great Toe68 0.40 Abnormal   +---------+------------------+-----+----------+--------+ +---------+------------------+-----+----------+-------+ Left Lt Pressure (mmHg)IndexWaveform Comment +---------+------------------+-----+----------+-------+ Brachial 169     +---------+------------------+-----+----------+-------+ PTA >254 1.50 monophasic  +---------+------------------+-----+----------+-------+ DP >254 1.50 monophasic  +---------+------------------+-----+----------+-------+ Great Toe40 0.24 Abnormal   +---------+------------------+-----+----------+-------+ Pedal arteries appear hyperemic. Patient refused Brachial pressure in the right arm. Summary: Right: Resting right ankle-brachial index indicates noncompressible right lower extremity arteries. The right toe- brachial index is abnormal. Left: Resting left ankle-brachial index indicates noncompressible left lower extremity arteries. The left toe-brachial index is abnormal. He  is also having considerably more swelling in his left calf. This was not there when I last saw him 2 weeks ago. He tells me that some of the home health compression wraps have been slipping down and that may be the issue here however a month I am uncertain Electronic Signature(s) Signed: 08/20/2019 6:46:53 PM By: [name] MD Entered By:  [name] on 08/20/2019 13:28:11 -------------------------------------------------------------------------------- Physical Exam Details Patient Name: Date of Service: [name], [name] 08/20/2019 10:30 AM Medical Record Patient Account Number: [id number] Number: Treating RN: Date of Birth/Sex: Nov 07, 1945 (72 y.o. Male) Other Clinician: Primary Care Provider: [name] Treating Provider/Extender:Shirah Roseman, [name] Referring Provider: [name] in Treatment: 2 Constitutional Sitting or standing Blood Pressure is within target range for patient.. Pulse regular and within target range for patient.Marland Kitchen Respirations regular, non-labored and within target range.. Temperature is normal and within the target range for the patient.Marland Kitchen Appears in no distress. Respiratory work of breathing is normal. Bilateral breath sounds are clear and equal in all lobes with no wheezes, rales or rhonchi.. Cardiovascular Heart rhythm and rate regular, without murmur or gallop. No evidence of systemic fluid volume overload. Integumentary (Hair, Skin) No erythema around the wound. Notes Wound exam; not as much surface slough as I remember from 2 weeks ago however the wound is still substantial. Peripheral pulses are reduced. There is no evidence of infection however considerable amount of swelling around the wound with some tenderness. Electronic Signature(s) Signed: 08/20/2019 6:46:53 PM By: [name] MD Entered By: [name] on 08/20/2019 13:34:30 -------------------------------------------------------------------------------- Physician Orders Details Patient Name: Date of Service: BRYNDEN, THUNE 08/20/2019 10:30 AM Medical Record Patient Account Number: [id number] Number: Treating RN: [name] Date of Birth/Sex: Jan 26, 1946 (73 y.o. Male) Other Clinician: Primary Care Provider: [name] Treating Provider/Extender:Melizza Kanode,  [name] Referring Provider: [name] in Treatment: 2 Verbal / Phone Orders: No Diagnosis Coding ICD-10 Coding Code Description S80.11XD Contusion of right lower leg, subsequent encounter L97.212 Non-pressure chronic ulcer of right calf with fat layer exposed I87.321 Chronic venous hypertension (idiopathic) with inflammation of right lower extremity E11.622 Type 2 diabetes mellitus with other skin ulcer Follow-up Appointments Return Appointment in 1 week. Dressing Change Frequency Wound #3 Right,Posterior Lower Leg Change dressing three times week. - 2x by home health on Wednesdays and Fridays, 1x by wound clinic on Mondays Skin Barriers/Peri-Wound Care Barrier cream Moisturizing lotion Wound Cleansing Clean wound with Normal Saline. - or normal saline on days that dressing is changed May shower with protection. Primary Wound Dressing Wound #3 Right,Posterior Lower Leg Iodoflex Secondary Dressing Wound #3 Right,Posterior Lower Leg ABD pad Kerramax - or Zetuvit (or other super absorbent dressing) Edema Control Kerlix and Coban - Right Lower Extremity Avoid standing for long periods of time Elevate legs to the level of the heart or above for 30 minutes daily and/or when sitting, a frequency of: - throughout the day Exercise regularly [facility] skilled nursing for wound care. - [facility] Venous Duplex Doppler Right lower leg - DVT rule out - **URGENT** - Right lower leg - pain, swelling in lower leg - rule out DVT - (ICD10 L97.212 - Non-pressure chronic ulcer of right calf with fat layer exposed) Electronic Signature(s) Signed: 08/20/2019 6:27:46 PM By: [name] RN, BSN Signed: 08/20/2019 6:46:53 PM By: [name] MD Entered By: [name] on 08/20/2019 11:39:24 -------------------------------------------------------------------------------- Prescription 08/20/2019 Patient Name: [name] Provider: [name] MD Date of Birth: Oct 09, 1945  NPI#: [id number] Sex: Male DEA#: [id number] Phone #: [phone] License #: [id number] Patient Address: [facility] [address] [place], [place] [address] [place], [place] [address] [phone] Allergies Elavil Reaction: hallucination Severity: Moderate Provider's Orders Venous Duplex Doppler Right lower leg - DVT rule out - ICD10: L97.212 - **URGENT** - Right lower leg - pain, swelling in lower leg - rule out DVT Signature(s): Date(s): Electronic Signature(s) Signed: 08/20/2019 6:27:46 PM By: [name] RN, BSN Signed: 08/20/2019 6:46:53 PM By: [name] MD Entered By: [name] on 08/20/2019 11:39:24 --------------------------------------------------------------------------------  Problem List Details Patient Name: Date of Service: [name], [name] 08/20/2019 10:30 AM Medical Record Patient Account Number: [id number] Number: Treating RN: [name] Date of Birth/Sex: [birth year] (73 y.o. Male) Other Clinician: Primary Care Provider: [name] Treating Provider/Extender:Haruna Rohlfs, [name] Referring Provider: [name] in Treatment: 2 Active Problems ICD-10 Evaluated Encounter Code Description Active Date Today Diagnosis S80.11XD Contusion of right lower leg, subsequent encounter 08/06/2019 No Yes L97.212 Non-pressure chronic ulcer of right calf with fat layer 08/06/2019 No Yes exposed I87.321 Chronic venous hypertension (idiopathic) with 08/06/2019 No Yes inflammation of right lower extremity E11.622 Type 2 diabetes mellitus with other skin ulcer 08/06/2019 No Yes Inactive Problems Resolved Problems Electronic Signature(s) Signed: 08/20/2019 6:46:53 PM By: [name] MD Entered By: [name] on 08/20/2019  13:19:34 -------------------------------------------------------------------------------- Progress Note Details Patient Name: Date of Service: [name], [name] 08/20/2019 10:30 AM Medical Record Patient Account Number: [id number] Number: Treating RN: Date of Birth/Sex: [birth year] (73 y.o. Male) Other Clinician: Primary Care Provider: [name] Treating Provider/Extender:Melquiades Kovar, [name] Referring Provider: [name] in Treatment: 2 Subjective History of Present Illness (HPI) [place] HPI Description: 73 year old gentleman who was recently seen by his nephrologist [name], and noted to have a wound on his left lower extremity which was lacerated 2 months ago and now has reopened. The patient's left shin has a ulceration with some exudate but no evidence of infection and he was referred to [place] for further care as it was known that the patient has had some peripheral vascular disease in the past. Past medical history significant for chronic kidney disease, atrial fibrillation, diabetes mellitus,status post kidney transplant in 1983 and 2005, a week fistula graft placement, status post previous bowel surgery. 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 10/06/2015 -- x-ray of the left tibia and fibula shows no evidence of osteomyelitis. The patient has also had Doppler studies of his extremity and is awaiting the appointment with the vascular surgeon. We have not yet received these reports. 10/13/2015 -- lower extremity venous duplex reflux evaluation shows reflux in the left common femoral vein, left saphenofemoral junction and the proximal greater saphenous vein extending to the proximal calf. There is also reflux in the left proximal to mid small saphenous vein. Arterial duplex studies done showed the resting ABI was not applicable due to tibial artery medial calcification. The left ABI was 0.8 using the  Doppler dorsalis pedis indicating mild arterial occlusive disease at rest with the posterior tibial artery noted to be noncompressible. The right TBI was 1 which is normal and the left ABI was 1 which is normal. Patient has otherwise been doing fine and has been compliant with his dressings. 10/20/2015 -- He was seen by [name] recently for a vascular opinion on 10/15/2015. His left lower extremity venous insufficiency  duplex study revealed GSV reflux,SS vein reflux and deep venous reflux in the common femoral vein. His ABIs were non compressible and his TBI on the right was 1.01 and on the left was 0.80. He was asked to continue with the wound care with compressive therapy followed by EVLA of the left GS vein 3 months. He recommended 20-30 mm thigh-high compression stockings and the need for a three-month trial of this. The patient had an Unna boot applied at the vascular office but he could not tolerate this with a lot of pain and issues with his toes and hence came here on Friday for removal of this and we reapplied a 2 layer compression. 11/10/2015 -- patient still has not purchased his 20-30 mm thigh-high compression stockings as prescribed by [name]. Readmission: 08/08/18 on evaluation today patient presents for readmission concerning a new injury to the left anterior lower extremity. He was previously seen in 2017 here in our clinic. He states that he has done fairly well since that point. Nonetheless he is having at this time some pain but states that he hit this on a table that fell over and actually struck his leg. This appears to have pulled back some of his skin which folded in on itself and is causing some difficulty as far as that is concerned. There does not appear to be any evidence of infection at this time. No fevers, chills, nausea, or vomiting noted at this time. He's been using dressings on his own currently without complication. 08/15/18 on evaluation today patient  actually appears to be doing somewhat better in regard to his wound of the lower [name] when compared to the first visit last week. I had to do a much more extensive debridement at that time it does appear that I'm gonna have to perform some debridement today but it does not look to be as extensive by any means. Nonetheless fortunately he does not show any signs of infection he does have discomfort at this site. I believe based on what I'm seeing currently he may benefit from Iodoflex to help keep the wound bed clean. Patient tolerated therapy without complication. Upon evaluation today the patient actually appears to be doing excellent in regard to his left lower extremity ulcer. This is much better than the previous two visits where he had a lot of necrotic tissue around the edge of the wound simply due to the fact that again there was a significant skin tear where the edge had been cleared away prior to reattaching and being able to heal appropriately. He seems to be doing much better at this point. 08/28/18 on evaluation today the patient's wound actually does appear to be showing signs of improvement. With that being said though he is improving he would likely note even greater improvement if we were able to sharply debride the wound. Nonetheless this caused him to much discomfort he tells me. 09/04/18 on evaluation today patient actually appears to be showing signs of improvement in regard to his left lower extremity ulcer. He has been tolerating the dressing changes including the wrap although he tells me at this point that the burning does last for a couple of days even with just the Iodoflex. I was afraid that this may been part of the issue that he was having with discomfort. It does seem to be the case. Nonetheless he shows no signs of evidence of infection at this time which is good news. No fevers chills noted ADMISSION to [facility]  wound care clinic 10/05/2018 This is a patient who  was cared for in 2017 and again in the fall of this year at our sister clinic and [place]. He actually lives in [place] in [place]. We have been dealing with an apparent traumatic area on the left anterior tibial area. This is been present for the last several months. He was supposed to be using Iodoflex Kerlix and Coban however he was hospitalized from 09/05/2018 through 09/11/2018 with delirium secondary to pneumonia. Since then he is only been putting Vaseline gauze on this without compression. He also has a more recent skin tear on the dorsal right hand that may have only happened in the last week. The patient had arterial studies done in 2017 in January which was 3 years ago. At that point he had noncompressible ABIs but really quite good TBI's both normal. Triphasic waveforms on the right monophasic at the left posterior tibial but triphasic at the left dorsalis pedis. His ABIs in our clinic today were both noncompressible 1/23; the patient has wounds on the right dorsal hand just distal to the wrist and on the left anterior lower extremity. Both of these look very healthy he is using Hydrofera Blue 1/30; left anterior lower extremity wound much smaller. Healthy looking surface. The laceration area just distal to the wrist on the dorsal hand on the right is also just about closed I used Hydrofera Blue here 2/6; left anterior lower extremity wound is much smaller but still open. The laceration area just distal to the wrist on the dorsal hand is fully epithelialized. 2/13; the patient's anterior lower extremity wound is closed. The laceration just distal to the wrist on the dorsal hand is also fully epithelialized and closed. The patient has external compression stockings which I think are 20/30 READMISSION 08/06/2019 Mr. Beem is a 73 year old man with had several times previously in our clinic. He is a diabetic with a history of chronic renal insufficiency status post kidney  transplant in 1983 and again in 2005. He was then in 2017 with a laceration on the left lower extremity. He was worked up at the time with arterial studies and reflux studies. The arterial studies showed ABIs to be noncompressible but TBI's were within normal limits. I do not have the reflux studies at the moment. He was also sent here in 2019 with a left lower extremity wound and then again in 2020 with left lower extremity trauma a skin tear on the wrist. He was discharged with 20/30 stockings identified from myself that that might not be enough compression. Nevertheless he states he was wearing these fairly reliably. In September he had a fall with a substantial bruise in the area of the wound. He says he saw orthopedics and they told him there was some muscle strain sometime it later this opened into a wound. He has a fairly substantial wound on the right posterior calf. Satellite areas around this including medially and posteriorly. He has not worn his stockings since the injury Past medical history; includes chronic renal failure secondary to diabetes with kidney transplant x2, atrial fibrillation, heart failure with preserved ejection fraction, coronary artery disease. ABIs on the right in our clinic were once again noncompressible 08/13/2019 on evaluation today patient appears to be doing decently well with regard to his wound compared to last week's evaluation. Unfortunately he is still having a lot of discomfort at this point which is I think in some part due to the 3 layer compression wrap which is  a little bit stronger I think for him. When he was here before we actually utilized a Kerlix and Coban wrap which he states seemed to got a little bit better. Nonetheless I think we can probably drop back to this in light of the discomfort that he had. Nonetheless the pain was not really right around the wound itself as much as it was around the ankle in particular. The Iodoflex does seem to  have done well for him As the wound is appearing somewhat better today which is excellent news. 11/30; still complaining of a lot of pain. Apparently arterial studies I ordered 2 weeks ago are below. I do not believe we have an appointment with vein and vascular as of yet; ABI Findings: +---------+------------------+-----+----------+--------+ Right Rt Pressure (mmHg)IndexWaveform Comment  +---------+------------------+-----+----------+--------+ PTA >254 1.50 monophasic  +---------+------------------+-----+----------+--------+ DP >254 1.50 monophasic  +---------+------------------+-----+----------+--------+ Great Toe68 0.40 Abnormal   +---------+------------------+-----+----------+--------+ +---------+------------------+-----+----------+-------+ Left Lt Pressure (mmHg)IndexWaveform Comment +---------+------------------+-----+----------+-------+ Brachial 169     +---------+------------------+-----+----------+-------+ PTA >254 1.50 monophasic  +---------+------------------+-----+----------+-------+ DP >254 1.50 monophasic  +---------+------------------+-----+----------+-------+ Great Toe40 0.24 Abnormal   +---------+------------------+-----+----------+-------+ Pedal arteries appear hyperemic. Patient refused Brachial pressure in the right arm. Summary: Right: Resting right ankle-brachial index indicates noncompressible right lower extremity arteries. The right toe- brachial index is abnormal. Left: Resting left ankle-brachial index indicates noncompressible left lower extremity arteries. The left toe-brachial index is abnormal. He is also having considerably more swelling in his left calf. This was not there when I last saw him 2 weeks ago. He tells me that some of the home health compression wraps have been slipping down and that may be the issue here however a month I am uncertain Objective Constitutional Sitting or standing  Blood Pressure is within target range for patient.. Pulse regular and within target range for patient.Marland Kitchen Respirations regular, non-labored and within target range.. Temperature is normal and within the target range for the patient.Marland Kitchen Appears in no distress. Vitals Time Taken: 10:57 AM, Height: 67 in, Weight: 190 lbs, BMI: 29.8, Temperature: 98.6 F, Pulse: 52 bpm, Respiratory Rate: 18 breaths/min, Blood Pressure: 113/62 mmHg. Respiratory work of breathing is normal. Bilateral breath sounds are clear and equal in all lobes with no wheezes, rales or rhonchi.. Cardiovascular Heart rhythm and rate regular, without murmur or gallop. No evidence of systemic fluid volume overload. General Notes: Wound exam; not as much surface slough as I remember from 2 weeks ago however the wound is still substantial. Peripheral pulses are reduced. There is no evidence of infection however considerable amount of swelling around the wound with some tenderness. Integumentary (Hair, Skin) No erythema around the wound. Wound #3 status is Open. Original cause of wound was Trauma. The wound is located on the Right,Posterior Lower Leg. The wound measures 14cm length x 6.5cm width x 0.4cm depth; 71.471cm^2 area and 28.588cm^3 volume. There is Fat Layer (Subcutaneous Tissue) Exposed exposed. There is no tunneling or undermining noted. There is a large amount of purulent drainage noted. The wound margin is well defined and not attached to the wound base. There is small (1-33%) pink granulation within the wound bed. There is a large (67-100%) amount of necrotic tissue within the wound bed including Eschar and Adherent Slough. 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 Dressing Change Frequency:  Wound #3 Right,Posterior Lower Leg: Change dressing three times week. - 2x by home health on Wednesdays and Fridays, 1x by wound clinic on Mondays Skin Barriers/Peri-Wound Care: Barrier cream Moisturizing lotion Wound Cleansing: Clean wound with Normal Saline. - or normal saline on days that dressing is changed May shower with protection. Primary Wound Dressing: Wound #3 Right,Posterior Lower Leg: Iodoflex Secondary Dressing: Wound #3 Right,Posterior Lower Leg: ABD pad Kerramax - or Zetuvit (or other super absorbent dressing) Edema Control: Kerlix and Coban - Right Lower Extremity Avoid standing for long periods of time Elevate legs to the level of the heart or above for 30 minutes daily and/or when sitting, a frequency of: - throughout the day Exercise regularly Home Health: [facility] for wound care. - [facility] ordered were: Venous Duplex Doppler Right lower leg - DVT rule out - **URGENT** - Right lower leg - pain, swelling in lower leg - rule out DVT 1. Continuing with the Iodoflex/Kerlix/Coban 2. Patient needs to see vascular surgery likely will need an angiogram 3. I am going to get a DVT rule out study of the right leg although this although this may be a uneven compression Electronic Signature(s) Signed: 08/20/2019 1:35:05 PM By: [name] MD Entered By: [name] on 08/20/2019 13:35:05 -------------------------------------------------------------------------------- SuperBill Details Patient Name: Date of Service: [name], [name] 08/20/2019 Medical Record [id number] Patient Account Number: [id number] Date of Birth/Sex: Treating RN: 1946/02/14 (73 y.o. 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 E11.622 Type 2 diabetes mellitus with other skin ulcer Facility Procedures CPT4 Code: [id number] Description: 99213 - WOUND CARE VISIT-LEV 3 EST PT Modifier: Quantity: 1 Physician Procedures CPT4 Code: 2423536 Description: 14431 - WC PHYS LEVEL 3 - EST PT ICD-10 Diagnosis Description S80.11XD Contusion of right lower leg, subsequent encounter L97.212 Non-pressure chronic ulcer of right calf with fat lay E11.622 Type 2 diabetes mellitus with other skin ulcer Modifier: er exposed Quantity: 1 Electronic Signature(s) Signed: 08/20/2019 6:46:53 PM By: [name] MD Entered By: [name] on 08/20/2019 13:32:55

## 2019-08-21 NOTE — Progress Notes (Signed)
[name], [name] ([id number]) Visit Report for 08/20/2019 Arrival Information Details Patient Name: Date of Service: [name], [name] 08/20/2019 10:30 AM Medical Record [id number] Patient Account Number: [id number] Date of Birth/Sex: Treating RN: Dec 19, 1945 (73 y.o. Male) Kela Millin Primary Care Maleki Hippe: [name] Other Clinician: Referring Dean Wonder: Treating Mariluz Crespo/Extender:Robson, [name], [name] in Treatment: 2 Visit Information History Since Last Visit Added or deleted any medications: No Patient Arrived: Ambulatory Any new allergies or adverse reactions: No Arrival Time: 10:55 Had a fall or experienced change in No Accompanied By: self activities of daily living that may affect Transfer Assistance: None risk of falls: Patient Identification Verified: Yes Signs or symptoms of abuse/neglect since last No Secondary Verification Process Yes visito Completed: Hospitalized since last visit: No Patient Has Alerts: Yes Implantable device outside of the clinic excluding No Patient Alerts: non cellular tissue based products placed in the center compressable since last visit: Has Dressing in Place as Prescribed: Yes Has Compression in Place as Prescribed: Yes Pain Present Now: Yes Electronic Signature(s) Signed: 08/21/2019 7:10:15 AM By: Kela Millin Entered By: Kela Millin on 08/20/2019 10:56:28 -------------------------------------------------------------------------------- Clinic Level of Care Assessment Details Patient Name: Date of Service: Michael, [name] 08/20/2019 10:30 AM Medical Record [id number] Patient Account Number: [id number] Date of Birth/Sex: Treating RN: December 16, 1945 (73 y.o. Male) [name] Primary Care Ashe Gago: [name] Other Clinician: Referring Annsley Akkerman: Treating Shanesha Bednarz/Extender:Robson, [name], [name] in Treatment: 2 Clinic Level of Care Assessment  Items TOOL 4 Quantity Score X - Use when only an EandM is performed on FOLLOW-UP visit 1 0 ASSESSMENTS - Nursing Assessment / Reassessment X - Reassessment of Co-morbidities (includes updates in patient status) 1 10 X - Reassessment of Adherence to Treatment Plan 1 5 ASSESSMENTS - Wound and Skin Assessment / Reassessment X - Simple Wound Assessment / Reassessment - one wound 1 5 []  - Complex Wound Assessment / Reassessment - multiple wounds 0 []  - Dermatologic / Skin Assessment (not related to wound area) 0 ASSESSMENTS - Focused Assessment []  - Circumferential Edema Measurements - multi extremities 0 []  - Nutritional Assessment / Counseling / Intervention 0 X - Lower Extremity Assessment (monofilament, tuning fork, pulses) 1 5 []  - Peripheral Arterial Disease Assessment (using hand held doppler) 0 ASSESSMENTS - Ostomy and/or Continence Assessment and Care []  - Incontinence Assessment and Management 0 []  - Ostomy Care Assessment and Management (repouching, etc.) 0 PROCESS - Coordination of Care X - Simple Patient / Family Education for ongoing care 1 15 []  - Complex (extensive) Patient / Family Education for ongoing care 0 X - Staff obtains Programmer, systems, Records, Test Results / Process Orders 1 10 X - Staff telephones HHA, Nursing Homes / Clarify orders / etc 1 10 []  - Routine Transfer to another Facility (non-emergent condition) 0 []  - Routine Hospital Admission (non-emergent condition) 0 []  - New Admissions / Biomedical engineer / Ordering NPWT, Apligraf, etc. 0 []  - Emergency Hospital Admission (emergent condition) 0 X - Simple Discharge Coordination 1 10 []  - Complex (extensive) Discharge Coordination 0 PROCESS - Special Needs []  - Pediatric / Minor Patient Management 0 []  - Isolation Patient Management 0 []  - Hearing / Language / Visual special needs 0 []  - Assessment of Community assistance (transportation, D/C planning, etc.) 0 []  - Additional assistance / Altered mentation  0 []  - Support Surface(s) Assessment (bed, cushion, seat, etc.) 0 INTERVENTIONS - Wound Cleansing / Measurement X - Simple Wound Cleansing - one wound 1 5 []  - Complex Wound Cleansing -  multiple wounds 0 X - Wound Imaging (photographs - any number of wounds) 1 5 []  - Wound Tracing (instead of photographs) 0 X - Simple Wound Measurement - one wound 1 5 []  - Complex Wound Measurement - multiple wounds 0 INTERVENTIONS - Wound Dressings []  - Small Wound Dressing one or multiple wounds 0 []  - Medium Wound Dressing one or multiple wounds 0 X - Large Wound Dressing one or multiple wounds 1 20 X - Application of Medications - topical 1 5 []  - Application of Medications - injection 0 INTERVENTIONS - Miscellaneous []  - External ear exam 0 []  - Specimen Collection (cultures, biopsies, blood, body fluids, etc.) 0 []  - Specimen(s) / Culture(s) sent or taken to Lab for analysis 0 []  - Patient Transfer (multiple staff / Harrel Lemon Lift / Similar devices) 0 []  - Simple Staple / Suture removal (25 or less) 0 []  - Complex Staple / Suture removal (26 or more) 0 []  - Hypo / Hyperglycemic Management (close monitor of Blood Glucose) 0 []  - Ankle / Brachial Index (ABI) - do not check if billed separately 0 X - Vital Signs 1 5 Has the patient been seen at the hospital within the last three years: Yes Total Score: 115 Level Of Care: New/Established - Level 3 Electronic Signature(s) Signed: 08/20/2019 6:27:46 PM By: [name] RN, BSN Entered By: [name] on 08/20/2019 12:33:44 -------------------------------------------------------------------------------- Encounter Discharge Information Details Patient Name: Date of Service: Michael, [name] 08/20/2019 10:30 AM Medical Record [id number] Patient Account Number: [id number] Date of Birth/Sex: Treating RN: [birth year] (73 y.o. Male) [name] Primary Care Tempestt Silba: [name] Other Clinician: Referring Linell Meldrum: Treating  Esco Joslyn/Extender:Robson, [name], [name] in Treatment: 2 Encounter Discharge Information Items Discharge Condition: Stable Ambulatory Status: Ambulatory Discharge Destination: Home Transportation: Private Auto Accompanied By: self Schedule Follow-up Appointment: Yes Clinical Summary of Care: Electronic Signature(s) Signed: 08/20/2019 6:19:57 PM By: [name] Entered By: [name] on 08/20/2019 17:19:25 -------------------------------------------------------------------------------- Lower Extremity Assessment Details Patient Name: Date of Service: KINTA, MARTIS 08/20/2019 10:30 AM Medical Record [id number] Patient Account Number: [id number] Date of Birth/Sex: Treating RN: 06-16-1946 (73 y.o. Male) Kela Millin Primary Care Dondi Burandt: [name] Other Clinician: Referring Seibert Keeter: Treating Peregrine Nolt/Extender:Robson, [name], [name] in Treatment: 2 Edema Assessment Assessed: [Left: No] [Right: No] Edema: [Left: Ye] [Right: s] Calf Left: Right: Point of Measurement: 30 cm From Medial Instep cm 40.5 cm Ankle Left: Right: Point of Measurement: 10 cm From Medial Instep cm 21 cm Vascular Assessment Pulses: Dorsalis Pedis Palpable: [Right:Yes] Electronic Signature(s) Signed: 08/21/2019 7:10:15 AM By: Kela Millin Entered By: Kela Millin on 08/20/2019 10:58:58 -------------------------------------------------------------------------------- Multi Wound Chart Details Patient Name: Date of Service: BIRDIE, BEVERIDGE 08/20/2019 10:30 AM Medical Record [id number] Patient Account Number: [id number] Date of Birth/Sex: Treating RN: 30-Jul-1946 (73 y.o. Male) Primary Care Larah Kuntzman: [name] Other Clinician: Referring Dawnisha Marquina: Treating Shterna Laramee/Extender:Robson, [name], [name] in Treatment: 2 Vital Signs Height(in): 67 Pulse(bpm): 52 Weight(lbs): 190 Blood  Pressure(mmHg): 113/62 Body Mass Index(BMI): 30 Temperature(F): 98.6 Respiratory 18 Rate(breaths/min): Photos: [3:No Photos] [N/A:N/A] Wound Location: [3:Right Lower Leg - Posterior N/A] Wounding Event: [3:Trauma] [N/A:N/A] 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] [N/A:N/A] Comorbid History: [3:Cataracts, Anemia, Arrhythmia, Congestive Heart Failure, Hypertension, Type II Diabetes, Gout, Neuropathy] [N/A:N/A] Date Acquired: [3:06/06/2019] [N/A:N/A] Weeks of Treatment: [3:2] [N/A:N/A] Wound Status: [3:Open] [N/A:N/A] Measurements L x W x D 14x6.5x0.4 [N/A:N/A] (cm) Area (cm) : [3:71.471] [N/A:N/A] Volume (cm) : [3:28.588] [N/A:N/A] % Reduction in Area: [3:-217.10%] [N/A:N/A] % Reduction in  Volume: -322.80% [N/A:N/A] Classification: [3:Partial Thickness] [N/A:N/A] Exudate Amount: [3:Large] [N/A:N/A] Exudate Type: [3:Purulent] [N/A:N/A] Exudate Color: [3:yellow, brown, green] [N/A:N/A] Wound Margin: [3:Well defined, not attached N/A] Granulation Amount: [3:Small (1-33%)] [N/A:N/A] Granulation Quality: [3:Pink] [N/A:N/A] Necrotic Amount: [3:Large (67-100%)] [N/A:N/A] Necrotic Tissue: [3:Eschar, Adherent Slough] [N/A:N/A] Exposed Structures: [3:Fat Layer (Subcutaneous Tissue) Exposed: Yes Fascia: No Tendon: No Muscle: No Joint: No Bone: No None] [N/A:N/A N/A] Treatment Notes Electronic Signature(s) Signed: 08/20/2019 6:46:53 PM By: [name] MD Entered By: [name] on 08/20/2019 13:20:30 -------------------------------------------------------------------------------- Multi-Disciplinary Care Plan Details Patient Name: Date of Service: CAILEAN, HEACOCK 08/20/2019 10:30 AM Medical Record [id number] Patient Account Number: [id number] Date of Birth/Sex: Treating RN: 1946/07/04 (73 y.o. Male) [name] Primary Care Lenwood Balsam: [name] Other Clinician: Referring Cheney Ewart: Treating Chanon Loney/Extender:Robson, [name], [name] in  Treatment: 2 Active Inactive Abuse / Safety / Falls / Self Care Management Nursing Diagnoses: History of Falls Potential for injury related to falls Goals: Patient will remain injury free related to falls Date Initiated: 08/06/2019 Target Resolution Date: 09/07/2019 Goal Status: Active Patient/caregiver will verbalize/demonstrate measures taken to prevent injury and/or falls Date Initiated: 08/06/2019 Target Resolution Date: 09/07/2019 Goal Status: Active Interventions: Assess Activities of Daily Living upon admission and as needed Assess fall risk on admission and as needed Assess: immobility, friction, shearing, incontinence upon admission and as needed Assess impairment of mobility on admission and as needed per policy Assess personal safety and home safety (as indicated) on admission and as needed Assess self care needs on admission and as needed Provide education on fall prevention Provide education on personal and home safety Notes: Nutrition Nursing Diagnoses: Impaired glucose control: actual or potential Potential for alteratiion in Nutrition/Potential for imbalanced nutrition Goals: Patient/caregiver agrees to and verbalizes understanding of need to use nutritional supplements and/or vitamins as prescribed Date Initiated: 08/06/2019 Target Resolution Date: 09/07/2019 Goal Status: Active Patient/caregiver will maintain therapeutic glucose control Date Initiated: 08/06/2019 Target Resolution Date: 09/07/2019 Goal Status: Active Interventions: Assess HgA1c results as ordered upon admission and as needed Assess patient nutrition upon admission and as needed per policy Provide education on elevated blood sugars and impact on wound healing Provide education on nutrition Treatment Activities: Education provided on Nutrition : 08/06/2019 Notes: Venous Leg Ulcer Nursing Diagnoses: Knowledge deficit related to disease process and management Goals: Patient will  maintain optimal edema control Date Initiated: 08/06/2019 Target Resolution Date: 09/07/2019 Goal Status: Active Patient/caregiver will verbalize understanding of disease process and disease management Date Initiated: 08/06/2019 Target Resolution Date: 09/07/2019 Goal Status: Active Interventions: Assess peripheral edema status every visit. Compression as ordered Provide education on venous insufficiency Notes: Wound/Skin Impairment Nursing Diagnoses: Impaired tissue integrity Goals: Patient/caregiver will verbalize understanding of skin care regimen Date Initiated: 08/06/2019 Target Resolution Date: 09/07/2019 Goal Status: Active Ulcer/skin breakdown will have a volume reduction of 30% by week 4 Date Initiated: 08/06/2019 Target Resolution Date: 09/07/2019 Goal Status: Active Interventions: Assess patient/caregiver ability to obtain necessary supplies Assess patient/caregiver ability to perform ulcer/skin care regimen upon admission and as needed Assess ulceration(s) every visit Provide education on ulcer and skin care Notes: Electronic Signature(s) Signed: 08/20/2019 6:27:46 PM By: [name] RN, BSN Entered By: [name] on 08/20/2019 11:34:11 -------------------------------------------------------------------------------- Pain Assessment Details Patient Name: Date of Service: KELEN, LAURA 08/20/2019 10:30 AM Medical Record [id number] Patient Account Number: [id number] Date of Birth/Sex: Treating RN: June 13, 1946 (73 y.o. Male) Kela Millin Primary Care Breindy Meadow: [name] Other Clinician: Referring Eimi Viney: Treating Hadrian Yarbrough/Extender:Robson, [name], [name] in Treatment:  2 Active Problems Location of Pain Severity and Description of Pain Patient Has Paino Yes Site Locations Pain Location: Pain in Ulcers With Dressing Change: Yes Duration of the Pain. Constant / Intermittento Constant Rate the  pain. Current Pain Level: 5 Worst Pain Level: 9 Least Pain Level: 5 Tolerable Pain Level: 5 Character of Pain Describe the Pain: Aching, Burning, Stabbing Pain Management and Medication Current Pain Management: Electronic Signature(s) Signed: 08/21/2019 7:10:15 AM By: Kela Millin Entered By: Kela Millin on 08/20/2019 10:58:17 -------------------------------------------------------------------------------- Patient/Caregiver Education Details Patient Name: [name] 11/30/2020andnbsp10:30 Date of Service: AM Medical Record [id number] Number: Patient Account Number: [id number] Treating RN: Date of Birth/Gender: 19-Apr-1946 (73 y.o. [name] Male) Other Clinician: Primary Care Treating Coladonato, Atilano Ina Physician: Physician/Extender: Referring Physician: [name] in Treatment: 2 Education Assessment Education Provided To: Patient Education Topics Provided Wound/Skin Impairment: Methods: Explain/Verbal Responses: State content correctly Electronic Signature(s) Signed: 08/20/2019 6:27:46 PM By: [name] RN, BSN Entered By: [name] on 08/20/2019 11:35:58 -------------------------------------------------------------------------------- Wound Assessment Details Patient Name: Date of Service: LATHAN, GIESELMAN 08/20/2019 10:30 AM Medical Record [id number] Patient Account Number: [id number] Date of Birth/Sex: Treating RN: 16-Oct-1945 (73 y.o. Male) Kela Millin Primary Care Norine Reddington: [name] Other Clinician: Referring Nayellie Sanseverino: Treating Andres Bantz/Extender:Robson, [name], [name] in Treatment: 2 Wound Status Wound Number: 3 Primary Venous Leg Ulcer Etiology: Wound Location: Right Lower Leg - Posterior Wound Open Wounding Event: Trauma Status: Date Acquired: 06/06/2019 ComorbidCataracts, Anemia, Arrhythmia, Congestive Weeks Of Treatment: 2 Weeks Of Treatment:  2 History: Heart Failure, Hypertension, Type II Diabetes, Clustered Wound: No Gout, Neuropathy Wound Measurements Length: (cm) 14 Width: (cm) 6.5 Depth: (cm) 0.4 Area: (cm) 71.471 Volume: (cm) 28.588 Wound Description Classification: Partial Thickness Wound Margin: Well defined, not attached Exudate Amount: Large Exudate Type: Purulent Exudate Color: yellow, brown, green Wound Bed Granulation Amount: Small (1-33%) Granulation Quality: Pink Necrotic Amount: Large (67-100%) Necrotic Quality: Eschar, Adherent Slough fter Cleansing: No ino Yes Exposed Structure sed: No Subcutaneous Tissue) Exposed: Yes sed: No sed: No ed: No d: No % Reduction in Area: -217.1% % Reduction in Volume: -322.8% Epithelialization: None Tunneling: No Undermining: No Foul Odor A Slough/Fibr Fascia Expo Fat Layer ( Tendon Expo Muscle Expo Joint Expos Bone Expose Treatment Notes Wound #3 (Right, Posterior Lower Leg) 1. Cleanse With Wound Cleanser Soap and water 2. Periwound Care Barrier cream Moisturizing lotion 3. Primary Dressing Applied Iodoflex 4. Secondary Dressing ABD Pad 5. Secured With Tape 6. Support Layer Applied Kerlix/Coban Notes x2 abd pads; netting. Electronic Signature(s) Signed: 08/21/2019 7:10:15 AM By: Kela Millin Entered By: Kela Millin on 08/20/2019 10:59:40 -------------------------------------------------------------------------------- Vitals Details Patient Name: Date of Service: DMITRI, PETTIGREW 08/20/2019 10:30 AM Medical Record [id number] Patient Account Number: [id number] Date of Birth/Sex: Treating RN: October 01, 1945 (73 y.o. Male) Kela Millin Primary Care Donaldson Richter: [name] Other Clinician: Referring Laelani Vasko: Treating Meaghen Vecchiarelli/Extender:Robson, [name], [name] in Treatment: 2 Vital Signs Time Taken: 10:57 Temperature (F): 98.6 Height (in): 67 Pulse (bpm): 52 Weight (lbs):  190 Respiratory Rate (breaths/min): 18 Body Mass Index (BMI): 29.8 Blood Pressure (mmHg): 113/62 Reference Range: 80 - 120 mg / dl Electronic Signature(s) Signed: 08/21/2019 7:10:15 AM By: Kela Millin Entered By: Kela Millin on 08/20/2019 10:57:45

## 2019-08-22 ENCOUNTER — Ambulatory Visit (HOSPITAL_COMMUNITY)
Admission: RE | Admit: 2019-08-22 | Discharge: 2019-08-22 | Disposition: A | Discharge status: Home / Self Care | Payer: Medicare Other | Source: Ambulatory Visit | Attending: Nephrology | Admitting: Nephrology

## 2019-08-22 ENCOUNTER — Other Ambulatory Visit: Payer: Self-pay

## 2019-08-22 DIAGNOSIS — N185 Chronic kidney disease, stage 5: Secondary | ICD-10-CM | POA: Diagnosis not present

## 2019-08-22 DIAGNOSIS — D631 Anemia in chronic kidney disease: Secondary | ICD-10-CM | POA: Insufficient documentation

## 2019-08-22 LAB — HEMOGLOBIN AND HEMATOCRIT, BLOOD
HCT: 33 % — ABNORMAL LOW (ref 39.0–52.0)
Hemoglobin: 9.7 g/dL — ABNORMAL LOW (ref 13.0–17.0)

## 2019-08-22 MED ORDER — DARBEPOETIN ALFA 150 MCG/0.3ML IJ SOSY
120.0000 ug | PREFILLED_SYRINGE | Freq: Once | INTRAMUSCULAR | Status: AC
  Administered 2019-08-22: 10:00:00 120 ug via SUBCUTANEOUS
  Filled 2019-08-22: qty 0.3

## 2019-08-22 NOTE — Progress Notes (Signed)
                   [facility] Note   Diagnosis: Anemia associate with Chronic Renal Failure   Provider: [name], MD   Procedure: Aranesp injection   Note: Patient received lab work and Aranesp injection sub-q to left arm. Tolerated well. Pre-injection hemoglobin was 9.7. Discharge paperwork given. Alert, oriented and ambulatory at discharge.  Otho Bellows, RN

## 2019-08-22 NOTE — Discharge Instructions (Signed)
Darbepoetin Alfa injection What is this medicine? DARBEPOETIN ALFA (dar be POE e tin AL fa) helps your body make more red blood cells. It is used to treat anemia caused by chronic kidney failure and chemotherapy. This medicine may be used for other purposes; ask your health care provider or pharmacist if you have questions. COMMON BRAND NAME(S): Aranesp What should I tell my health care provider before I take this medicine? They need to know if you have any of these conditions:  blood clotting disorders or history of blood clots  cancer patient not on chemotherapy  cystic fibrosis  heart disease, such as angina, heart failure, or a history of a heart attack  hemoglobin level of 12 g/dL or greater  high blood pressure  low levels of folate, iron, or vitamin B12  seizures  an unusual or allergic reaction to darbepoetin, erythropoietin, albumin, hamster proteins, latex, other medicines, foods, dyes, or preservatives  pregnant or trying to get pregnant  breast-feeding How should I use this medicine? This medicine is for injection into a vein or under the skin. It is usually given by a health care professional in a hospital or clinic setting. If you get this medicine at home, you will be taught how to prepare and give this medicine. Use exactly as directed. Take your medicine at regular intervals. Do not take your medicine more often than directed. It is important that you put your used needles and syringes in a special sharps container. Do not put them in a trash can. If you do not have a sharps container, call your pharmacist or healthcare provider to get one. A special MedGuide will be given to you by the pharmacist with each prescription and refill. Be sure to read this information carefully each time. Talk to your pediatrician regarding the use of this medicine in children. While this medicine may be used in children as young as 1 month of age for selected conditions, precautions do  apply. Overdosage: If you think you have taken too much of this medicine contact a poison control center or emergency room at once. NOTE: This medicine is only for you. Do not share this medicine with others. What if I miss a dose? If you miss a dose, take it as soon as you can. If it is almost time for your next dose, take only that dose. Do not take double or extra doses. What may interact with this medicine? Do not take this medicine with any of the following medications:  epoetin alfa This list may not describe all possible interactions. Give your health care provider a list of all the medicines, herbs, non-prescription drugs, or dietary supplements you use. Also tell them if you smoke, drink alcohol, or use illegal drugs. Some items may interact with your medicine. What should I watch for while using this medicine? Your condition will be monitored carefully while you are receiving this medicine. You may need blood work done while you are taking this medicine. This medicine may cause a decrease in vitamin B6. You should make sure that you get enough vitamin B6 while you are taking this medicine. Discuss the foods you eat and the vitamins you take with your health care professional. What side effects may I notice from receiving this medicine? Side effects that you should report to your doctor or health care professional as soon as possible:  allergic reactions like skin rash, itching or hives, swelling of the face, lips, or tongue  breathing problems  changes in   vision  chest pain  confusion, trouble speaking or understanding  feeling faint or lightheaded, falls  high blood pressure  muscle aches or pains  pain, swelling, warmth in the leg  rapid weight gain  severe headaches  sudden numbness or weakness of the face, arm or leg  trouble walking, dizziness, loss of balance or coordination  seizures (convulsions)  swelling of the ankles, feet, hands  unusually weak or  tired Side effects that usually do not require medical attention (report to your doctor or health care professional if they continue or are bothersome):  diarrhea  fever, chills (flu-like symptoms)  headaches  nausea, vomiting  redness, stinging, or swelling at site where injected This list may not describe all possible side effects. Call your doctor for medical advice about side effects. You may report side effects to FDA at 1-800-FDA-1088. Where should I keep my medicine? Keep out of the reach of children. Store in a refrigerator between 2 and 8 degrees C (36 and 46 degrees F). Do not freeze. Do not shake. Throw away any unused portion if using a single-dose vial. Throw away any unused medicine after the expiration date. NOTE: This sheet is a summary. It may not cover all possible information. If you have questions about this medicine, talk to your doctor, pharmacist, or health care provider.  2020 Elsevier/Gold Standard (2017-09-21 16:44:20)  

## 2019-08-27 ENCOUNTER — Encounter (HOSPITAL_BASED_OUTPATIENT_CLINIC_OR_DEPARTMENT_OTHER): Payer: Medicare Other | Attending: Internal Medicine | Admitting: Internal Medicine

## 2019-08-27 ENCOUNTER — Other Ambulatory Visit: Payer: Self-pay

## 2019-08-27 DIAGNOSIS — I5032 Chronic diastolic (congestive) heart failure: Secondary | ICD-10-CM | POA: Diagnosis not present

## 2019-08-27 DIAGNOSIS — L97212 Non-pressure chronic ulcer of right calf with fat layer exposed: Secondary | ICD-10-CM | POA: Insufficient documentation

## 2019-08-27 DIAGNOSIS — E1151 Type 2 diabetes mellitus with diabetic peripheral angiopathy without gangrene: Secondary | ICD-10-CM | POA: Insufficient documentation

## 2019-08-27 DIAGNOSIS — I2510 Atherosclerotic heart disease of native coronary artery without angina pectoris: Secondary | ICD-10-CM | POA: Insufficient documentation

## 2019-08-27 DIAGNOSIS — I130 Hypertensive heart and chronic kidney disease with heart failure and stage 1 through stage 4 chronic kidney disease, or unspecified chronic kidney disease: Secondary | ICD-10-CM | POA: Diagnosis not present

## 2019-08-27 DIAGNOSIS — K219 Gastro-esophageal reflux disease without esophagitis: Secondary | ICD-10-CM | POA: Diagnosis not present

## 2019-08-27 DIAGNOSIS — E1140 Type 2 diabetes mellitus with diabetic neuropathy, unspecified: Secondary | ICD-10-CM | POA: Diagnosis not present

## 2019-08-27 DIAGNOSIS — N189 Chronic kidney disease, unspecified: Secondary | ICD-10-CM | POA: Insufficient documentation

## 2019-08-27 DIAGNOSIS — Z940 Kidney transplant status: Secondary | ICD-10-CM | POA: Diagnosis not present

## 2019-08-27 DIAGNOSIS — S81801A Unspecified open wound, right lower leg, initial encounter: Secondary | ICD-10-CM | POA: Diagnosis not present

## 2019-08-27 DIAGNOSIS — E11622 Type 2 diabetes mellitus with other skin ulcer: Secondary | ICD-10-CM | POA: Insufficient documentation

## 2019-08-27 DIAGNOSIS — M109 Gout, unspecified: Secondary | ICD-10-CM | POA: Diagnosis not present

## 2019-08-27 DIAGNOSIS — E1122 Type 2 diabetes mellitus with diabetic chronic kidney disease: Secondary | ICD-10-CM | POA: Diagnosis not present

## 2019-08-27 DIAGNOSIS — I4891 Unspecified atrial fibrillation: Secondary | ICD-10-CM | POA: Diagnosis not present

## 2019-08-28 NOTE — Progress Notes (Signed)
[name], [name] ([id number]) Visit Report for 08/06/2019 Allergy List Details Patient Name: Date of Service: [name], [name] 08/06/2019 10:30 AM Medical Record [id number] Patient Account Number: [id number] Date of Birth/Sex: Treating RN: September 14, 1946 (73 y.o. [name]) Carlene Coria Primary Care Kannon Baum: [name] Other Clinician: Referring Cambrie Sonnenfeld: Treating Tuyet Bader/Extender:Robson, [name], [name] in Treatment: 0 Allergies Active Allergies Elavil Reaction: hallucination Severity: Moderate Allergy Notes Electronic Signature(s) Signed: 08/28/2019 2:50:47 PM By: Carlene Coria RN Entered By: Carlene Coria on 08/06/2019 11:18:15 -------------------------------------------------------------------------------- [place] Details Patient Name: Date of Service: [name], [name] 08/06/2019 10:30 AM Medical Record [id number] Patient Account Number: [id number] Date of Birth/Sex: Treating RN: 28-Nov-1945 (73 y.o. [name]) Carlene Coria Primary Care Yashica Sterbenz: [name] Other Clinician: Referring Likisha Alles: Treating Flay Ghosh/Extender:Robson, [name], [name] in Treatment: 0 Visit Information Patient Arrived: Ambulatory Arrival Time: 11:14 Accompanied By: self Transfer Assistance: None Patient Identification Verified: Yes Secondary Verification Process Yes Completed: Patient Has Alerts: Yes Patient Alerts: non compressable History Since Last Visit All ordered tests and consults were completed: No Added or deleted any medications: No Any new allergies or adverse reactions: No Had a fall or experienced change in activities of daily living that may affect risk of falls: No Signs or symptoms of abuse/neglect since last visito No Hospitalized since last visit: No Implantable device outside of the clinic excluding cellular tissue based products placed in the center since last visit: No Electronic Signature(s) Signed:  08/28/2019 2:50:47 PM By: Carlene Coria RN Entered By: Carlene Coria on 08/06/2019 11:34:32 -------------------------------------------------------------------------------- Clinic Level of Care Assessment Details Patient Name: Date of Service: [name], [name] 08/06/2019 10:30 AM Medical Record [id number] Patient Account Number: [id number] Date of Birth/Sex: Treating RN: 10-14-1945 (73 y.o. [name] Primary Care Theadora Noyes: [name] Other Clinician: Referring Carrera Kiesel: Treating Tanajah Boulter/Extender:Robson, [name], [name] in Treatment: 0 Clinic Level of Care Assessment Items TOOL 1 Quantity Score X - Use when EandM and Procedure is performed on INITIAL visit 1 0 ASSESSMENTS - Nursing Assessment / Reassessment X - General Physical Exam (combine w/ comprehensive assessment (listed just below) 1 20 when performed on new pt. evals) X - Comprehensive Assessment (HX, ROS, Risk Assessments, Wounds Hx, etc.) 1 25 ASSESSMENTS - Wound and Skin Assessment / Reassessment []  - Dermatologic / Skin Assessment (not related to wound area) 0 ASSESSMENTS - Ostomy and/or Continence Assessment and Care []  - Incontinence Assessment and Management 0 []  - Ostomy Care Assessment and Management (repouching, etc.) 0 PROCESS - Coordination of Care X - Simple Patient / Family Education for ongoing care 1 15 []  - Complex (extensive) Patient / Family Education for ongoing care 0 X - Staff obtains Programmer, systems, Records, Test Results / Process Orders 1 10 X - Staff telephones HHA, Nursing Homes / Clarify orders / etc 1 10 []  - Routine Transfer to another Facility (non-emergent condition) 0 []  - Routine Hospital Admission (non-emergent condition) 0 X - New Admissions / Biomedical engineer / Ordering NPWT, Apligraf, etc. 1 15 []  - Emergency Hospital Admission (emergent condition) 0 PROCESS - Special Needs []  - Pediatric / Minor Patient Management 0 []  - Isolation Patient  Management 0 []  - Hearing / Language / Visual special needs 0 []  - Assessment of Community assistance (transportation, D/C planning, etc.) 0 []  - Additional assistance / Altered mentation 0 []  - Support Surface(s) Assessment (bed, cushion, seat, etc.) 0 INTERVENTIONS - Miscellaneous []  - External ear exam 0 []  - Patient Transfer (multiple staff / Civil Service fast streamer / Similar devices)  0 []  - Simple Staple / Suture removal (25 or less) 0 []  - Complex Staple / Suture removal (26 or more) 0 []  - Hypo/Hyperglycemic Management (do not check if billed separately) 0 X - Ankle / Brachial Index (ABI) - do not check if billed separately 1 15 Has the patient been seen at the hospital within the last three years: Yes Total Score: 110 Level Of Care: New/Established - Level 3 Electronic Signature(s) Signed: 08/06/2019 5:59:11 PM By: [name] RN, BSN Entered By: [name] on 08/06/2019 13:17:34 -------------------------------------------------------------------------------- Compression Therapy Details Patient Name: Date of Service: [name], [name] 08/06/2019 10:30 AM Medical Record [id number] Patient Account Number: [id number] Date of Birth/Sex: Treating RN: 1945-11-11 (73 y.o. [name] Primary Care Karlton Maya: [name] Other Clinician: Referring Nysha Koplin: Treating Mayola Mcbain/Extender:Robson, [name], [name] in Treatment: 0 Compression Therapy Performed for Wound Wound #3 Right,Posterior Lower Leg Assessment: Performed By: Clinician [name], RN Compression Type: Three Layer Post Procedure Diagnosis Same as Pre-procedure Electronic Signature(s) Signed: 08/06/2019 5:59:11 PM By: [name] RN, BSN Entered By: [name] on 08/06/2019 12:23:49 -------------------------------------------------------------------------------- Compression Therapy Details Patient Name: Date of Service: [name], [name] 08/06/2019 10:30 AM Medical  Record [id number] Patient Account Number: [id number] Date of Birth/Sex: Treating RN: [birth year] (73 y.o. [name] Primary Care Lane Kjos: [name] Other Clinician: Referring Yeraldi Fidler: Treating Angeliah Wisdom/Extender:Robson, [name], [name] in Treatment: 0 Compression Therapy Performed for Wound Wound #4 Right,Lateral Lower Leg Assessment: Performed By: Clinician [name], RN Compression Type: Three Layer Post Procedure Diagnosis Same as Pre-procedure Electronic Signature(s) Signed: 08/06/2019 5:59:11 PM By: [name] RN, BSN Entered By: [name] on 08/06/2019 12:23:49 -------------------------------------------------------------------------------- Encounter Discharge Information Details Patient Name: Date of Service: [name], [name] 08/06/2019 10:30 AM Medical Record [id number] Patient Account Number: [id number] Date of Birth/Sex: Treating RN: 1946/07/21 (73 y.o. [name], Tammi Klippel Primary Care Shaquanda Graves: [name] Other Clinician: Referring Adonte Vanriper: Treating Antwione Picotte/Extender:Robson, [name], [name] in Treatment: 0 Encounter Discharge Information Items Post Procedure Vitals Discharge Condition: Stable Temperature (F): 98.2 Ambulatory Status: Ambulatory Pulse (bpm): 51 Discharge Destination: Home Respiratory Rate (breaths/min): 18 Transportation: Private Auto Blood Pressure (mmHg): 109/90 Accompanied By: self Schedule Follow-up Appointment: Yes Clinical Summary of Care: Electronic Signature(s) Signed: 08/06/2019 5:37:24 PM By: [name] Entered By: [name] on 08/06/2019 12:40:52 -------------------------------------------------------------------------------- Lower Extremity Assessment Details Patient Name: Date of Service: [name], [name] 08/06/2019 10:30 AM Medical Record [id number] Patient Account Number: [id number] Date of Birth/Sex: Treating RN: [birth year]  (73 y.o. [name]) Carlene Coria Primary Care Vaudie Engebretsen: [name] Other Clinician: Referring Bronsyn Shappell: Treating Jaidan Stachnik/Extender:Robson, [name], [name] in Treatment: 0 Edema Assessment Assessed: [Left: No] [Right: No] E[Left: dema] [Right: :] Calf Left: Right: Point of Measurement: 30 cm From Medial Instep cm 42 cm Ankle Left: Right: Point of Measurement: 10 cm From Medial Instep cm 24 cm Notes non compressable Electronic Signature(s) Signed: 08/28/2019 2:50:47 PM By: Carlene Coria RN Entered By: Carlene Coria on 08/06/2019 11:34:11 -------------------------------------------------------------------------------- Multi Wound Chart Details Patient Name: Date of Service: ZAYON, TRULSON 08/06/2019 10:30 AM Medical Record [id number] Patient Account Number: [id number] Date of Birth/Sex: Treating RN: Mar 26, 1946 (73 y.o. 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] [4:Lower Extremity] Comorbid History: [3:Cataracts, Anemia, Arrhythmia, Congestive Heart Failure, Hypertension, Heart Failure, Hypertension, Type II Diabetes, Gout, Neuropathy] [4:Cataracts, Anemia, Arrhythmia, Congestive Type II Diabetes, Gout, Neuropathy] [N/A:N/A] Date Acquired: [3:06/06/2019] [4:06/06/2019] [N/A:N/A] Weeks of Treatment: [3:0] [4:0] [N/A:N/A] Wound Status:  [3:Open] [4:Open] [N/A:N/A] Measurements L x W x D 8.2x3.5x0.3 [4:2.4x0.6x0.1] [N/A:N/A] (cm) Area (cm) : [3:22.541] [4:1.131] [N/A:N/A] Volume (cm) : [3:6.762] [4:0.113] [N/A:N/A] Classification: [3:Grade 2] [4:Grade 2] [N/A:N/A] Exudate Amount: [3:Medium] [4:Medium] [N/A:N/A] Exudate Type: [3:Serosanguineous] [4:Serosanguineous] [N/A:N/A] Exudate Color: [3:red, brown] [4:red, brown] [N/A:N/A] Granulation Amount: [3:None Present (0%)] [4:None Present (0%)] [N/A:N/A] Necrotic Amount: [3:Large (67-100%)] [4:Large (67-100%)] [N/A:N/A] Necrotic Tissue: [3:Eschar, Adherent [place] N/A] Exposed Structures: [3:Fascia: No Fat Layer (Subcutaneous Tissue) Exposed: Yes Tissue) Exposed: No Tendon: No Muscle: No Joint: No Bone: No] [4:Fat Layer (Subcutaneous N/A Fascia: No Tendon: No Muscle: No Joint: No Bone: No] Epithelialization: [3:None] [4:None] [N/A:N/A] Debridement: [3:Debridement - Excisional N/A] [N/A:N/A] Pre-procedure [3:12:12] [4:N/A] [N/A:N/A] Verification/Time Out Taken: Pain Control: [3:Other] [4:N/A] [N/A:N/A] Tissue Debrided: [3:Subcutaneous, Slough] [4:N/A] [N/A:N/A] Level: [3:Skin/Subcutaneous Tissue N/A] [N/A:N/A] Debridement Area (sq cm):28.7 [4:N/A] [N/A:N/A] Instrument: [3:Curette] [4:N/A] [N/A:N/A] Bleeding: [3:Moderate] [4:N/A] [N/A:N/A] Hemostasis Achieved: [3:Pressure] [4:N/A] [N/A:N/A] Procedural Pain: [3:4] [4:N/A] [N/A:N/A] Post Procedural Pain: [3:2] [4:N/A] [N/A:N/A] Debridement Treatment Procedure was tolerated [4:N/A] [N/A:N/A] Response: [3:well] Post Debridement [3:8.2x3.5x0.3] [4:N/A] [N/A:N/A] Measurements L x W x D (cm) Post Debridement [3:6.762] [4:N/A] [N/A:N/A] Volume: (cm) Procedures Performed: Compression Therapy [3:Debridement] [4:Compression Therapy] [N/A:N/A] Treatment Notes Electronic Signature(s) Signed: 08/06/2019 5:46:17 PM By: [name] MD Signed: 08/06/2019 5:59:11 PM By: [name] RN, BSN Entered By:  [name] on 08/06/2019 12:34:37 -------------------------------------------------------------------------------- Multi-Disciplinary Care Plan Details Patient Name: Date of Service: [name], [name] 08/06/2019 10:30 AM Medical Record [id number] Patient Account Number: [id number] Date of Birth/Sex: Treating RN: [birth year] (73 y.o. [name] Primary Care [place] Latka: [name] Other Clinician: Referring Adonus Uselman: Treating Yena Tisby/Extender:Robson, [name], [name] in Treatment: 0 Active Inactive Abuse / Safety / Falls / Self Care Management Nursing Diagnoses: History of Falls Potential for injury related to falls Goals: Patient will remain injury free related to falls Date Initiated: 08/06/2019 Target Resolution Date: 09/07/2019 Goal Status: Active Patient/caregiver will verbalize/demonstrate measures taken to prevent injury and/or falls Date Initiated: 08/06/2019 Target Resolution Date: 09/07/2019 Goal Status: Active Interventions: Assess Activities of Daily Living upon admission and as needed Assess fall risk on admission and as needed Assess: immobility, friction, shearing, incontinence upon admission and as needed Assess impairment of mobility on admission and as needed per policy Assess personal safety and home safety (as indicated) on admission and as needed Assess self care needs on admission and as needed Provide education on fall prevention Provide education on personal and home safety Notes: Nutrition Nursing Diagnoses: Impaired glucose control: actual or potential Potential for alteratiion in Nutrition/Potential for imbalanced nutrition Goals: Patient/caregiver agrees to and verbalizes understanding of need to use nutritional supplements and/or vitamins as prescribed Date Initiated: 08/06/2019 Target Resolution Date: 09/07/2019 Goal Status: Active Patient/caregiver will maintain therapeutic glucose control Date  Initiated: 08/06/2019 Target Resolution Date: 09/07/2019 Goal Status: Active Interventions: Assess HgA1c results as ordered upon admission and as needed Assess patient nutrition upon admission and as needed per policy Provide education on elevated blood sugars and impact on wound healing Provide education on nutrition Notes: Venous Leg Ulcer Nursing Diagnoses: Knowledge  deficit related to disease process and management Goals: Patient will maintain optimal edema control Date Initiated: 08/06/2019 Target Resolution Date: 09/07/2019 Goal Status: Active Patient/caregiver will verbalize understanding of disease process and disease management Date Initiated: 08/06/2019 Target Resolution Date: 09/07/2019 Goal Status: Active Interventions: Assess peripheral edema status every visit. Compression as ordered Provide education on venous insufficiency Notes: Wound/Skin Impairment Nursing Diagnoses: Impaired tissue integrity Goals: Patient/caregiver will verbalize understanding of skin care regimen Date Initiated: 08/06/2019 Target Resolution Date: 09/07/2019 Goal Status: Active Ulcer/skin breakdown will have a volume reduction of 30% by week 4 Date Initiated: 08/06/2019 Target Resolution Date: 09/07/2019 Goal Status: Active Interventions: Assess patient/caregiver ability to obtain necessary supplies Assess patient/caregiver ability to perform ulcer/skin care regimen upon admission and as needed Assess ulceration(s) every visit Provide education on ulcer and skin care Notes: Electronic Signature(s) Signed: 08/06/2019 5:59:11 PM By: [name] RN, BSN Entered By: [name] on 08/06/2019 13:16:43 -------------------------------------------------------------------------------- Pain Assessment Details Patient Name: Date of Service: [name], [name] 08/06/2019 10:30 AM Medical Record [id number] Patient Account Number: [id number] Date of Birth/Sex: Treating  RN: 1946-01-29 (73 y.o. Oval Linsey Primary Care Dayzee Trower: [name] Other Clinician: Referring Kiosha Buchan: Treating Jade Burright/Extender:Robson, [name], [name] in Treatment: 0 Active Problems Location of Pain Severity and Description of Pain Patient Has Paino Yes Site Locations With Dressing Change: Yes Duration of the Pain. Constant / Intermittento Constant Rate the pain. Current Pain Level: 5 Worst Pain Level: 10 Least Pain Level: 2 Tolerable Pain Level: 5 Character of Pain Describe the Pain: Burning Pain Management and Medication Current Pain Management: Medication: Yes Cold Application: No Rest: Yes Massage: No Activity: No T.E.N.S.: No Heat Application: No Leg drop or elevation: No Is the Current Pain Management Adequate: Inadequate How does your wound impact your activities of daily livingo Sleep: Yes Bathing: No Appetite: No Relationship With Others: No Bladder Continence: No Emotions: No Bowel Continence: No Work: No Toileting: No Drive: No Dressing: No Hobbies: No Electronic Signature(s) Signed: 08/28/2019 2:50:47 PM By: Carlene Coria RN Entered By: Carlene Coria on 08/06/2019 11:38:55 -------------------------------------------------------------------------------- Patient/Caregiver Education Details Patient Name: [name] 11/16/2020andnbsp10:30 Date of Service: AM Medical Record [id number] Number: Patient Account Number: [id number] Treating RN: Date of Birth/Gender: 07/14/1946 (73 y.o. [name]) Other Clinician: Primary Care Physician:Coladonato, Terald Sleeper Referring Physician: Physician/Extender: [name] in Treatment: 0 Education Assessment Education Provided To: Patient Education Topics Provided Nutrition: Methods: Explain/Verbal Responses: State content correctly Safety: Methods: Explain/Verbal Responses: State content correctly Venous: Methods:  Explain/Verbal Responses: State content correctly Wound/Skin Impairment: Methods: Explain/Verbal Responses: State content correctly Electronic Signature(s) Signed: 08/06/2019 5:59:11 PM By: [name] RN, BSN Entered By: [name] on 08/06/2019 13:16:58 -------------------------------------------------------------------------------- Wound Assessment Details Patient Name: Date of Service: [name], [name] 08/06/2019 10:30 AM Medical Record [id number] Patient Account Number: [id number] Date of Birth/Sex: Treating RN: 1946-08-07 (73 y.o. [name]) Carlene Coria Primary Care Ossie Beltran: Other Clinician: [name] Referring Svara Twyman: Treating Annye Forrey/Extender:Robson, [name], [name] in Treatment: 0 Wound Status Wound Number: 3 Primary Venous Leg Ulcer Etiology: Wound Location: Right Lower Leg - Posterior Wound Open Wounding Event: Trauma Status: Date Acquired: 06/06/2019 Comorbid Cataracts, Anemia, Arrhythmia, Congestive Weeks Of Treatment: 0 History: Heart Failure, Hypertension, Type II Diabetes, Clustered Wound: No Gout, Neuropathy Photos Wound Measurements Length: (cm) 8.2 Width: (cm) 3.5 Depth: (cm) 0.3 Area: (cm) 22.541 Volume: (cm) 6.762 Wound Description Classification: Partial Thickness Exudate Amount: Medium Exudate Type: Serosanguineous Exudate Color: red, brown Wound Bed Granulation  Amount: None Present (0%) Necrotic Amount: Large (67-100%) Necrotic Quality: Eschar, Adherent Slough After Cleansing: No brino Yes Exposed Structure osed: No (Subcutaneous Tissue) Exposed: No osed: No osed: No sed: No ed: No % Reduction in Area: 0% % Reduction in Volume: 0% Epithelialization: None Tunneling: No Undermining: No Foul Odor Slough/Fi Fascia Exp Fat Layer Tendon Exp Muscle Exp Joint Expo Bone Expos Electronic Signature(s) Signed: 08/07/2019 4:00:22 PM By: Mikeal Hawthorne EMT/HBOT Signed: 08/28/2019 2:50:47 PM  By: Carlene Coria RN Entered By: Mikeal Hawthorne on 08/07/2019 09:07:12 -------------------------------------------------------------------------------- Wound Assessment Details Patient Name: Date of Service: [name], [name] 08/06/2019 10:30 AM Medical Record [id number] Patient Account Number: [id number] Date of Birth/Sex: Treating RN: Apr 10, 1946 (73 y.o. [name]) Carlene Coria Primary Care Mycah Mcdougall: [name] Other Clinician: Referring Killian Ress: Treating Shawnie Nicole/Extender:Robson, [name], [name] in Treatment: 0 Wound Status Wound Number: 4 Primary Venous Leg Ulcer Etiology: Wound Location: Right Lower Leg - Lateral Wound Open Wounding Event: Trauma Status: Date Acquired: 06/06/2019 Comorbid Cataracts, Anemia, Arrhythmia, Congestive Weeks Of Treatment: 0 History: Heart Failure, Hypertension, Type II Diabetes, Clustered Wound: No Gout, Neuropathy Photos Wound Measurements Length: (cm) 2.4 % Reduct Width: (cm) 0.6 % Reduct Depth: (cm) 0.1 Epitheli Area: (cm) 1.131 Volume: (cm) 0.113 Wound Description Full Thickness Without Exposed Support Foul Od Classification: Structures Slough/ Exudate Medium Amount: Exudate Serosanguineous Type: Exudate red, brown Color: Wound Bed Granulation Amount: None Present (0%) Necrotic Amount: Large (67-100%) Fascia Necrotic Quality: Eschar, Adherent Slough Fat Lay Tendon Muscle Joint E Bone Ex or After Cleansing: No Fibrino Yes Exposed Structure Exposed: No er (Subcutaneous Tissue) Exposed: Yes Exposed: No Exposed: No xposed: No posed: No ion in Area: 0% ion in Volume: 0% alization: None Electronic Signature(s) Signed: 08/07/2019 4:00:22 PM By: Mikeal Hawthorne EMT/HBOT Signed: 08/28/2019 2:50:47 PM By: Carlene Coria RN Entered By: Mikeal Hawthorne on 08/07/2019 09:06:19 -------------------------------------------------------------------------------- Vitals Details Patient Name: Date of  Service: LEELYN, JASINSKI 08/06/2019 10:30 AM Medical Record [id number] Patient Account Number: [id number] Date of Birth/Sex: Treating RN: 14-Mar-1946 (73 y.o. [name]) Carlene Coria Primary Care Sherene Plancarte: [name] Other Clinician: Referring Marijane Trower: Treating [place] Deleo/Extender:Robson, [name], [name] in Treatment: 0 Vital Signs Time Taken: 11:16 Temperature (F): 98.2 Height (in): 67 Pulse (bpm): 51 Source: Stated Respiratory Rate (breaths/min): 18 Weight (lbs): 190 Blood Pressure (mmHg): 109/90 Source: Stated Capillary Blood Glucose (mg/dl): 130 Body Mass Index (BMI): 29.8 Reference Range: 80 - 120 mg / dl Notes CBG per patient Electronic Signature(s) Signed: 08/28/2019 2:50:47 PM By: Carlene Coria RN Entered By: Carlene Coria on 08/06/2019 11:18:00

## 2019-08-28 NOTE — Progress Notes (Signed)
[name], [name] ([id number]) Visit Report for 08/13/2019 Arrival Information Details Patient Name: Date of Service: [name], Michael 08/13/2019 8:45 AM Medical Record [id number] Patient Account Number: [id number] Date of Birth/Sex: Treating RN: 1946/07/10 (73 y.o. [name]) Carlene Coria Primary Care Dulcy Sida: [name] Other Clinician: Referring Rashawna Scoles: Treating Issac Moure/Extender:Stone III, Finis Bud, [name] in Treatment: 1 Visit Information History Since Last Visit All ordered tests and consults were completed: No Patient Arrived: Ambulatory Added or deleted any medications: No Arrival Time: 08:45 Any new allergies or adverse reactions: No Accompanied By: self Had a fall or experienced change in No Transfer Assistance: None activities of daily living that may affect Patient Identification Verified: Yes risk of falls: Secondary Verification Process Yes Signs or symptoms of abuse/neglect since last No Completed: visito Patient Has Alerts: Yes Hospitalized since last visit: No Patient Alerts: non Implantable device outside of the clinic excluding No compressable cellular tissue based products placed in the center since last visit: Has Dressing in Place as Prescribed: Yes Has Compression in Place as Prescribed: Yes Pain Present Now: No Electronic Signature(s) Signed: 08/28/2019 2:55:30 PM By: Carlene Coria RN Entered By: Carlene Coria on 08/13/2019 08:46:11 -------------------------------------------------------------------------------- Clinic Level of Care Assessment Details Patient Name: Date of Service: [name], Michael 08/13/2019 8:45 AM Medical Record [id number] Patient Account Number: [id number] Date of Birth/Sex: Treating RN: 1945-12-29 (73 y.o. [name] Primary Care [name]: [name] Other Clinician: Referring Teriyah Purington: Treating [name]/Extender:Stone III, Finis Bud, [name] in Treatment:  1 Clinic Level of Care Assessment Items TOOL 4 Quantity Score X - Use when only an EandM is performed on FOLLOW-UP visit 1 0 ASSESSMENTS - Nursing Assessment / Reassessment X - Reassessment of Co-morbidities (includes updates in patient status) 1 10 X - Reassessment of Adherence to Treatment Plan 1 5 ASSESSMENTS - Wound and Skin Assessment / Reassessment []  - Simple Wound Assessment / Reassessment - one wound 0 X - Complex Wound Assessment / Reassessment - multiple wounds 2 5 []  - Dermatologic / Skin Assessment (not related to wound area) 0 ASSESSMENTS - Focused Assessment []  - Circumferential Edema Measurements - multi extremities 0 []  - Nutritional Assessment / Counseling / Intervention 0 X - Lower Extremity Assessment (monofilament, tuning fork, pulses) 1 5 []  - Peripheral Arterial Disease Assessment (using hand held doppler) 0 ASSESSMENTS - Ostomy and/or Continence Assessment and Care []  - Incontinence Assessment and Management 0 []  - Ostomy Care Assessment and Management (repouching, etc.) 0 PROCESS - Coordination of Care X - Simple Patient / Family Education for ongoing care 1 15 []  - Complex (extensive) Patient / Family Education for ongoing care 0 X - Staff obtains Programmer, systems, Records, Test Results / Process Orders 1 10 X - Staff telephones HHA, Nursing Homes / Clarify orders / etc 1 10 []  - Routine Transfer to another Facility (non-emergent condition) 0 []  - Routine Hospital Admission (non-emergent condition) 0 []  - New Admissions / Biomedical engineer / Ordering NPWT, Apligraf, etc. 0 []  - Emergency Hospital Admission (emergent condition) 0 X - Simple Discharge Coordination 1 10 []  - Complex (extensive) Discharge Coordination 0 PROCESS - Special Needs []  - Pediatric / Minor Patient Management 0 []  - Isolation Patient Management 0 []  - Hearing / Language / Visual special needs 0 []  - Assessment of Community assistance (transportation, D/C planning, etc.) 0 []  -  Additional assistance / Altered mentation 0 []  - Support Surface(s) Assessment (bed, cushion, seat, etc.) 0 INTERVENTIONS - Wound Cleansing / Measurement []  - Simple Wound Cleansing -  one wound 0 X - Complex Wound Cleansing - multiple wounds 2 5 X - Wound Imaging (photographs - any number of wounds) 1 5 []  - Wound Tracing (instead of photographs) 0 []  - Simple Wound Measurement - one wound 0 X - Complex Wound Measurement - multiple wounds 2 5 INTERVENTIONS - Wound Dressings []  - Small Wound Dressing one or multiple wounds 0 []  - Medium Wound Dressing one or multiple wounds 0 X - Large Wound Dressing one or multiple wounds 1 20 []  - Application of Medications - topical 0 []  - Application of Medications - injection 0 INTERVENTIONS - Miscellaneous []  - External ear exam 0 []  - Specimen Collection (cultures, biopsies, blood, body fluids, etc.) 0 []  - Specimen(s) / Culture(s) sent or taken to Lab for analysis 0 []  - Patient Transfer (multiple staff / Civil Service fast streamer / Similar devices) 0 []  - Simple Staple / Suture removal (25 or less) 0 []  - Complex Staple / Suture removal (26 or more) 0 []  - Hypo / Hyperglycemic Management (close monitor of Blood Glucose) 0 []  - Ankle / Brachial Index (ABI) - do not check if billed separately 0 X - Vital Signs 1 5 Has the patient been seen at the hospital within the last three years: Yes Total Score: 125 Level Of Care: New/Established - Level 4 Electronic Signature(s) Signed: 08/13/2019 2:18:43 PM By: [name] RN, BSN Entered By: [name] on 08/13/2019 09:22:55 -------------------------------------------------------------------------------- Encounter Discharge Information Details Patient Name: Date of Service: [name], Michael 08/13/2019 8:45 AM Medical Record [id number] Patient Account Number: [id number] Date of Birth/Sex: Treating RN: 06-18-1946 (73 y.o. [name] Primary Care Ebrahim Deremer: [name] Other  Clinician: Referring Emanuela Runnion: Treating Topacio Cella/Extender:Stone III, Finis Bud, [name] in Treatment: 1 Encounter Discharge Information Items Discharge Condition: Stable Ambulatory Status: Ambulatory Discharge Destination: Home Transportation: Private Auto Accompanied By: self Schedule Follow-up Appointment: Yes Clinical Summary of Care: Patient Declined Electronic Signature(s) Signed: 08/14/2019 1:35:50 PM By: [name] RN, BSN Entered By: [name] on 08/13/2019 09:38:40 -------------------------------------------------------------------------------- Lower Extremity Assessment Details Patient Name: Date of Service: KENARD, MORAWSKI 08/13/2019 8:45 AM Medical Record [id number] Patient Account Number: [id number] Date of Birth/Sex: Treating RN: May 06, 1946 (73 y.o. [name]) Carlene Coria Primary Care Josefina Rynders: [name] Other Clinician: Referring Raea Magallon: Treating Gregery Walberg/Extender:Stone III, Finis Bud, Governor Rooks Weeks in Treatment: 1 Edema Assessment Assessed: [Left: No] [Right: No] E[Left: dema] [Right: :] Calf Left: Right: Point of Measurement: 30 cm From Medial Instep cm 39 cm Ankle Left: Right: Point of Measurement: 10 cm From Medial Instep cm 20 cm Electronic Signature(s) Signed: 08/28/2019 2:55:30 PM By: Carlene Coria RN Entered By: Carlene Coria on 08/13/2019 08:47:23 -------------------------------------------------------------------------------- Multi-Disciplinary Care Plan Details Patient Name: Date of Service: ONEIL, BEHNEY 08/13/2019 8:45 AM Medical Record [id number] Patient Account Number: [id number] Date of Birth/Sex: Treating RN: Sep 02, 1946 (73 y.o. [name] Primary Care Savaya Hakes: [name] Other Clinician: Referring Gizel Riedlinger: Treating Adolph Clutter/Extender:Stone III, Finis Bud, [name] in Treatment: 1 Active Inactive Abuse / Safety / Falls / Self Care Management Nursing  Diagnoses: History of Falls Potential for injury related to falls Goals: Patient will remain injury free related to falls Date Initiated: 08/06/2019 Target Resolution Date: 09/07/2019 Goal Status: Active Patient/caregiver will verbalize/demonstrate measures taken to prevent injury and/or falls Date Initiated: 08/06/2019 Target Resolution Date: 09/07/2019 Goal Status: Active Interventions: Assess Activities of Daily Living upon admission and as needed Assess fall risk on admission and as needed Assess: immobility, friction, shearing, incontinence upon admission  and as needed Assess impairment of mobility on admission and as needed per policy Assess personal safety and home safety (as indicated) on admission and as needed Assess self care needs on admission and as needed Provide education on fall prevention Provide education on personal and home safety Notes: Nutrition Nursing Diagnoses: Impaired glucose control: actual or potential Potential for alteratiion in Nutrition/Potential for imbalanced nutrition Goals: Patient/caregiver agrees to and verbalizes understanding of need to use nutritional supplements and/or vitamins as prescribed Date Initiated: 08/06/2019 Target Resolution Date: 09/07/2019 Goal Status: Active Patient/caregiver will maintain therapeutic glucose control Date Initiated: 08/06/2019 Target Resolution Date: 09/07/2019 Goal Status: Active Interventions: Assess HgA1c results as ordered upon admission and as needed Assess patient nutrition upon admission and as needed per policy Provide education on elevated blood sugars and impact on wound healing Provide education on nutrition Treatment Activities: Education provided on Nutrition : 08/06/2019 Notes: Venous Leg Ulcer Nursing Diagnoses: Knowledge deficit related to disease process and management Goals: Patient will maintain optimal edema control Date Initiated: 08/06/2019 Target Resolution Date:  09/07/2019 Goal Status: Active Patient/caregiver will verbalize understanding of disease process and disease management Date Initiated: 08/06/2019 Target Resolution Date: 09/07/2019 Goal Status: Active Interventions: Assess peripheral edema status every visit. Compression as ordered Provide education on venous insufficiency Notes: Wound/Skin Impairment Nursing Diagnoses: Impaired tissue integrity Goals: Patient/caregiver will verbalize understanding of skin care regimen Date Initiated: 08/06/2019 Target Resolution Date: 09/07/2019 Goal Status: Active Ulcer/skin breakdown will have a volume reduction of 30% by week 4 Date Initiated: 08/06/2019 Target Resolution Date: 09/07/2019 Goal Status: Active Interventions: Assess patient/caregiver ability to obtain necessary supplies Assess patient/caregiver ability to perform ulcer/skin care regimen upon admission and as needed Assess ulceration(s) every visit Provide education on ulcer and skin care Notes: Electronic Signature(s) Signed: 08/13/2019 2:18:43 PM By: [name] RN, BSN Entered By: [name] on 08/13/2019 09:22:06 -------------------------------------------------------------------------------- Pain Assessment Details Patient Name: Date of Service: ARMANDO, BUKHARI 08/13/2019 8:45 AM Medical Record [id number] Patient Account Number: [id number] Date of Birth/Sex: Treating RN: 11-22-1945 (73 y.o. [name]) Carlene Coria Primary Care Hrithik Boschee: [name] Other Clinician: Referring Meeyah Ovitt: Treating Genecis Veley/Extender:Stone III, Finis Bud, [name] in Treatment: 1 Active Problems Location of Pain Severity and Description of Pain Patient Has Paino No Site Locations Pain Management and Medication Current Pain Management: Electronic Signature(s) Signed: 08/28/2019 2:55:30 PM By: Carlene Coria RN Entered By: Carlene Coria on 08/13/2019  08:46:55 -------------------------------------------------------------------------------- Patient/Caregiver Education Details Patient Name: [name] 11/23/2020andnbsp8:45 Date of Service: AM Medical Record [id number] Number: Patient Account Number: [id number] Treating RN: 1946/04/21 (73 y.o. [name] Date of Birth/Gender: M) Other Clinician: Primary Care Treating [place], Glennon Hamilton Physician: Reuel Derby Physician: Physician/Extender: Referring Physician: [name] in Treatment: 1 Education Assessment Education Provided To: Patient Education Topics Provided Tissue Oxygenation: Methods: Explain/Verbal Responses: State content correctly Venous: Methods: Explain/Verbal Responses: State content correctly Wound/Skin Impairment: Methods: Explain/Verbal Responses: State content correctly Electronic Signature(s) Signed: 08/13/2019 2:18:43 PM By: [name] RN, BSN Entered By: [name] on 08/13/2019 09:22:27 -------------------------------------------------------------------------------- Wound Assessment Details Patient Name: Date of Service: LLEYTON, BYERS 08/13/2019 8:45 AM Medical Record [id number] Patient Account Number: [id number] Date of Birth/Sex: Treating RN: 05/27/1946 (73 y.o. [name]) Carlene Coria Primary Care Jency Schnieders: [name] Other Clinician: Referring Ace Bergfeld: Treating Stetson Pelaez/Extender:Stone III, Finis Bud, Governor Rooks Weeks in Treatment: 1 Wound Status Wound Number: 3 Primary Venous Leg Ulcer Etiology: Wound Location: Right Lower Leg - Posterior Wound Open Wounding Event: Trauma  Status: Date Acquired: 06/06/2019 Comorbid Cataracts, Anemia, Arrhythmia, Congestive Weeks Of Treatment: 1 History: Heart Failure, Hypertension, Type II Diabetes, Clustered Wound: No Gout, Neuropathy Photos Wound Measurements Length: (cm) 13 % Reductio Width: (cm) 3.5 %  Reductio Depth: (cm) 0.4 Epithelial Area: (cm) 35.736 Tunneling Volume: (cm) 14.294 Undermini Wound Description Classification: Partial Thickness Exudate Amount: Medium Exudate Type: Serosanguineous Exudate Color: red, brown Wound Bed Granulation Amount: None Present (0%) Necrotic Amount: Large (67-100%) Necrotic Quality: Eschar, Adherent Slough Foul Odor After Cleansing: No Slough/Fibrino Yes Exposed Structure Fascia Exposed: No Fat Layer (Subcutaneous Tissue) Exposed: No Tendon Exposed: No Muscle Exposed: No Joint Exposed: No Bone Exposed: No n in Area: -58.5% n in Volume: -111.4% ization: None : No ng: No Electronic Signature(s) Signed: 08/14/2019 2:17:56 PM By: Mikeal Hawthorne EMT/HBOT Signed: 08/28/2019 2:55:30 PM By: Carlene Coria RN Entered By: Mikeal Hawthorne on 08/14/2019 07:40:42 -------------------------------------------------------------------------------- Wound Assessment Details Patient Name: Date of Service: MAXIMUM, REILAND 08/13/2019 8:45 AM Medical Record [id number] Patient Account Number: [id number] Date of Birth/Sex: Treating RN: September 12, 1946 (73 y.o. [name]) Carlene Coria Primary Care Bradon Fester: [name] Other Clinician: Referring Matie Dimaano: Treating Romie Keeble/Extender:Stone III, Finis Bud, Governor Rooks Weeks in Treatment: 1 Wound Status Wound Number: 4 Primary Venous Leg Ulcer Etiology: Wound Location: Right Lower Leg - Lateral Wound Healed - Epithelialized Wounding Event: Trauma Status: Date Acquired: 06/06/2019 ComorbidCataracts, Anemia, Arrhythmia, Congestive Comorbid Cataracts, Anemia, Arrhythmia, Congestive Weeks Of Treatment: 1 History: Heart Failure, Hypertension, Type II Diabetes, Clustered Wound: No Gout, Neuropathy Photos Wound Measurements Length: (cm) 0 % Reduct Width: (cm) 0 % Reduct Depth: (cm) 0 Epitheli Area: (cm) 0 Tunneli Volume: (cm) 0 Undermi Wound Description Classification: Full Thickness Without  Exposed Support Foul Odo Structures Slough/F Exudate None Present Amount: Wound Bed Granulation Amount: None Present (0%) Necrotic Amount: None Present (0%) Fascia E Fat Laye Tendon E Muscle E Joint Ex Bone Exp r After Cleansing: No ibrino No Exposed Structure xposed: No r (Subcutaneous Tissue) Exposed: No xposed: No xposed: No posed: No osed: No ion in Area: 100% ion in Volume: 100% alization: Large (67-100%) ng: No ning: No Electronic Signature(s) Signed: 08/14/2019 2:17:56 PM By: Mikeal Hawthorne EMT/HBOT Signed: 08/28/2019 2:55:30 PM By: Carlene Coria RN Previous Signature: 08/13/2019 2:18:43 PM Version By: [name] RN, BSN Entered By: Mikeal Hawthorne on 08/14/2019 07:40:09 -------------------------------------------------------------------------------- Vitals Details Patient Name: Date of Service: IMRAAN, WENDELL 08/13/2019 8:45 AM Medical Record [id number] Patient Account Number: [id number] Date of Birth/Sex: Treating RN: [birth year] (73 y.o. Oval Linsey Primary Care Mickenzie Stolar: [name] Other Clinician: Referring Daizy Outen: Treating Makari Portman/Extender:Stone III, Finis Bud, Governor Rooks Weeks in Treatment: 1 Vital Signs Time Taken: 08:46 Temperature (F): 98.4 Height (in): 67 Pulse (bpm): 71 Weight (lbs): 190 Respiratory Rate (breaths/min): 18 Body Mass Index (BMI): 29.8 Blood Pressure (mmHg): 93/78 Reference Range: 80 - 120 mg / dl Electronic Signature(s) Signed: 08/28/2019 2:55:30 PM By: Carlene Coria RN Entered By: Carlene Coria on 08/13/2019 08:46:41

## 2019-08-28 NOTE — Progress Notes (Signed)
KAEVION, SINCLAIR ([id number]) Visit Report for 08/06/2019 Chief Complaint Document Details Patient Name: Date of Service: NATALE, THOMA 08/06/2019 10:30 AM Medical Record [id number] Patient Account Number: [id number] Date of Birth/Sex: Treating RN: 1946-03-12 (73 y.o. [name] Primary Care Provider: [name] Other Clinician: Referring Provider: Treating Provider/Extender:Robson, [name], [name] in Treatment: 0 Information Obtained from: Patient Chief Complaint 08/06/2019; patient returns to clinic with a fairly substantial wound on the right posterior calf Electronic Signature(s) Signed: 08/06/2019 5:46:17 PM By: [name] MD Entered By: [name] on 08/06/2019 12:37:34 -------------------------------------------------------------------------------- Debridement Details Patient Name: Date of Service: MICHARL, HELMES 08/06/2019 10:30 AM Medical Record [id number] Patient Account Number: [id number] Date of Birth/Sex: [birth year] (73 y.o. M) Treating RN: [name] Primary Care Provider: [name] Other Clinician: Referring Provider: Treating Provider/Extender:Robson, [name], [name] in Treatment: 0 Debridement Performed for Wound #3 Right,Posterior Lower Leg Assessment: Performed By: Physician Ricard Dillon., MD Debridement Type: Debridement Severity of Tissue Pre Fat layer exposed Debridement: Level of Consciousness (Pre- Awake and Alert procedure): Pre-procedure Verification/Time Out Taken: Yes - 12:12 Start Time: 12:12 Pain Control: Other : Benzocaine 20% Total Area Debrided (L x W): 8.2 (cm) x 3.5 (cm) = 28.7 (cm) Tissue and other material Viable, Non-Viable, Slough, Subcutaneous, Slough debrided: Level: Skin/Subcutaneous Tissue Debridement Description: Excisional Instrument: Curette Bleeding: Moderate Hemostasis Achieved: Pressure End Time: 12:14 Procedural  Pain: 4 Post Procedural Pain: 2 Response to Treatment: Procedure was tolerated well Level of Consciousness Awake and Alert (Post-procedure): Post Debridement Measurements of Total Wound Length: (cm) 8.2 Width: (cm) 3.5 Depth: (cm) 0.3 Volume: (cm) 6.762 Character of Wound/Ulcer Post Improved Debridement: Severity of Tissue Post Debridement: Fat layer exposed Post Procedure Diagnosis Same as Pre-procedure Electronic Signature(s) Signed: 08/06/2019 5:46:17 PM By: [name] MD Signed: 08/06/2019 5:59:11 PM By: [name] RN, BSN Entered By: [name] on 08/06/2019 12:34:55 -------------------------------------------------------------------------------- HPI Details Patient Name: Date of Service: ANTWAUN, BUTH 08/06/2019 10:30 AM Medical Record [id number] Patient Account Number: [id number] Date of Birth/Sex: Treating RN: 03/21/1946 (73 y.o. [name] Primary Care Provider: [name] Other Clinician: Referring Provider: Treating Provider/Extender:Robson, [name], [name] in Treatment: 0 History of Present Illness HPI Description: [name] HPI Description: 73 year old gentleman who was recently seen by his nephrologist [name], and noted to have a wound on his left lower extremity which was lacerated 2 months ago and now has reopened. The patient's left shin has a ulceration with some exudate but no evidence of infection and he was referred to [place] for further care as it was known that the patient has had some peripheral vascular disease in the past. Past medical history significant for chronic kidney disease, atrial fibrillation, diabetes mellitus,status post kidney transplant in 1983 and 2005, a week fistula graft placement, status post previous bowel surgery. 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 10/06/2015 -- x-ray of the left tibia and fibula shows no evidence of  osteomyelitis. The patient has also had Doppler studies of his extremity and is awaiting the appointment with the vascular surgeon. We have not yet received these reports. 10/13/2015 -- lower extremity venous duplex reflux evaluation shows reflux in the left common femoral vein, left saphenofemoral junction and the proximal greater saphenous vein extending to the proximal calf. There is also reflux in the left proximal to mid small saphenous vein. Arterial duplex studies done showed the resting ABI was not applicable due  to tibial artery medial calcification. 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 The right TBI was 1 which is normal and the left ABI was 1 which is normal. Patient has otherwise been doing fine and has been compliant with his dressings. 10/20/2015 -- He was seen by [name] recently for a vascular opinion on 10/15/2015. His left lower extremity venous insufficiency duplex study revealed GSV reflux,SS vein reflux and deep venous reflux in the common femoral vein. His ABIs were non compressible and his TBI on the right was 1.01 and on the left was 0.80. He was asked to continue with the wound care with compressive therapy followed by EVLA of the left GS vein 3 months. He recommended 20-30 mm thigh-high compression stockings and the need for a three-month trial of this. The patient had an Unna boot applied at the vascular office but he could not tolerate this with a lot of pain and issues with his toes and hence came here on Friday for removal of this and we reapplied a 2 layer compression. 11/10/2015 -- patient still has not purchased his 20-30 mm thigh-high compression stockings as prescribed by [name]. Readmission: 08/08/18 on evaluation today patient presents for readmission concerning a new injury to the left anterior lower extremity. He was  previously seen in 2017 here in our clinic. He states that he has done fairly well since that point. Nonetheless he is having at this time some pain but states that he hit this on a table that fell over and actually struck his leg. This appears to have pulled back some of his skin which folded in on itself and is causing some difficulty as far as that is concerned. There does not appear to be any evidence of infection at this time. No fevers, chills, nausea, or vomiting noted at this time. He's been using dressings on his own currently without complication. 08/15/18 on evaluation today patient actually appears to be doing somewhat better in regard to his wound of the lower [name] when compared to the first visit last week. I had to do a much more extensive debridement at that time it does appear that I'm gonna have to perform some debridement today but it does not look to be as extensive by any means. Nonetheless fortunately he does not show any signs of infection he does have discomfort at this site. I believe based on what I'm seeing currently he may benefit from Iodoflex to help keep the wound bed clean. Patient tolerated therapy without complication. Upon evaluation today the patient actually appears to be doing excellent in regard to his left lower extremity ulcer. This is much better than the previous two visits where he had a lot of necrotic tissue around the edge of the wound simply due to the fact that again there was a significant skin tear where the edge had been cleared away prior to reattaching and being able to heal appropriately. He seems to be doing much better at this point. 08/28/18 on evaluation today the patient's wound actually does appear to be showing signs of improvement. With that being said though he is improving he would likely note even greater improvement if we were able to sharply debride the wound. Nonetheless this caused him to much discomfort he tells me. 09/04/18  on evaluation today patient actually appears to be showing signs of improvement in regard to his  left lower extremity ulcer. He has been tolerating the dressing changes including the wrap although he tells me at this point that the burning does last for a couple of days even with just the Iodoflex. I was afraid that this may been part of the issue that he was having with discomfort. It does seem to be the case. Nonetheless he shows no signs of evidence of infection at this time which is good news. No fevers chills noted ADMISSION to [name] 10/05/2018 This is a patient who was cared for in 2017 and again in the fall of this year at our sister clinic and [place]. He actually lives in [place] in [place]. We have been dealing with an apparent traumatic area on the left anterior tibial area. This is been present for the last several months. He was supposed to be using Iodoflex Kerlix and Coban however he was hospitalized from 09/05/2018 through 09/11/2018 with delirium secondary to pneumonia. Since then he is only been putting Vaseline gauze on this without compression. He also has a more recent skin tear on the dorsal right hand that may have only happened in the last week. The patient had arterial studies done in 2017 in January which was 3 years ago. At that point he had noncompressible ABIs but really quite good TBI's both normal. Triphasic waveforms on the right monophasic at the left posterior tibial but triphasic at the left dorsalis pedis. His ABIs in our clinic today were both noncompressible 1/23; the patient has wounds on the right dorsal hand just distal to the wrist and on the left anterior lower extremity. Both of these look very healthy he is using Hydrofera Blue 1/30; left anterior lower extremity wound much smaller. Healthy looking surface. The laceration area just distal to the wrist on the dorsal hand on the right is also just about closed I used Hydrofera  Blue here 2/6; left anterior lower extremity wound is much smaller but still open. The laceration area just distal to the wrist on the dorsal hand is fully epithelialized. 2/13; the patient's anterior lower extremity wound is closed. The laceration just distal to the wrist on the dorsal hand is also fully epithelialized and closed. The patient has external compression stockings which I think are 20/30 READMISSION 08/06/2019 Mr. Crevier is a 73 year old man with had several times previously in our clinic. He is a diabetic with a history of chronic renal insufficiency status post kidney transplant in 1983 and again in 2005. He was then in 2017 with a laceration on the left lower extremity. He was worked up at the time with arterial studies and reflux studies. The arterial studies showed ABIs to be noncompressible but TBI's were within normal limits. I do not have the reflux studies at the moment. He was also sent here in 2019 with a left lower extremity wound and then again in 2020 with left lower extremity trauma a skin tear on the wrist. He was discharged with 20/30 stockings identified from myself that that might not be enough compression. Nevertheless he states he was wearing these fairly reliably. In September he had a fall with a substantial bruise in the area of the wound. He says he saw orthopedics and they told him there was some muscle strain sometime it later this opened into a wound. He has a fairly substantial wound on the right posterior calf. Satellite areas around this including medially and posteriorly. He has not worn his stockings since the injury Past medical  history; includes chronic renal failure secondary to diabetes with kidney transplant x2, atrial fibrillation, heart failure with preserved ejection fraction, coronary artery disease. ABIs on the right in our clinic were once again noncompressible Electronic Signature(s) Signed: 08/06/2019 5:46:17 PM By: [name]  MD Entered By: [name] on 08/06/2019 12:42:17 -------------------------------------------------------------------------------- Physical Exam Details Patient Name: Date of Service: JANCARLOS, THRUN 08/06/2019 10:30 AM Medical Record [id number] Patient Account Number: [id number] Date of Birth/Sex: Treating RN: 04-16-1946 (73 y.o. [name] Primary Care Provider: [name] Other Clinician: Referring Provider: Treating Provider/Extender:Robson, [name], [name] in Treatment: 0 Constitutional Sitting or standing Blood Pressure is within target range for patient.. Pulse regular and within target range for patient.Marland Kitchen Respirations regular, non-labored and within target range.. Temperature is normal and within the target range for the patient.Marland Kitchen Appears in no distress. Eyes Conjunctivae clear. No discharge.no icterus. Respiratory work of breathing is normal. Bilateral breath sounds are clear and equal in all lobes with no wheezes, rales or rhonchi.. Cardiovascular Heart rhythm and rate regular, without murmur or gallop. JVP elevated to the angle of the jaw no S3. Pedal pulses palpable but bilaterally reduced. Popliteal pulses not palpable. Severe bilateral hemosiderin deposition bilateral 3+ pitting edema that actually on the right goes right up into his thighs. Gastrointestinal (GI) Very distended striations. Surgical scar. No shifting dullness. No liver or spleen enlargement. Integumentary (Hair, Skin) Hemosiderin deposition bilateral. Psychiatric appears at normal baseline. Notes Wound exam; the patient has a substantial wound area in the right posterior mid calf. Satellite reasons around this inferiorly and anterior. Very adherent necrotic material attempted debridement with a #5 curette of very adherent necrotic material. Unfortunately this will require further debridement. There is no evidence of surrounding infection. No calf  tenderness Electronic Signature(s) Signed: 08/06/2019 5:46:17 PM By: [name] MD Entered By: [name] on 08/06/2019 12:49:30 -------------------------------------------------------------------------------- Physician Orders Details Patient Name: Date of Service: JENNINGS, CORADO 08/06/2019 10:30 AM Medical Record [id number] Patient Account Number: [id number] Date of Birth/Sex: Treating RN: 06-06-1946 (73 y.o. [name] Primary Care Provider: [name] Other Clinician: Referring Provider: Treating Provider/Extender:Robson, [name], [name] in Treatment: 0 Verbal / Phone Orders: No Diagnosis Coding Follow-up Appointments Return Appointment in 1 week. Dressing Change Frequency Wound #3 Right,Posterior Lower Leg Change dressing three times week. - 2x by home health on Wednesdays and Fridays, 1x by wound clinic on Mondays Wound #4 Right,Lateral Lower Leg Change dressing three times week. - 2x by home health on Wednesdays and Fridays, 1x by wound clinic on Mondays Skin Barriers/Peri-Wound Care Barrier cream Moisturizing lotion Wound Cleansing Clean wound with Normal Saline. - or normal saline on days that dressing is changed May shower with protection. Primary Wound Dressing Wound #3 Right,Posterior Lower Leg Iodoflex Wound #4 Right,Lateral Lower Leg Iodoflex Secondary Dressing Wound #3 Right,Posterior Lower Leg ABD pad Kerramax - or Zetuvit (or other super absorbent dressing) Wound #4 Right,Lateral Lower Leg ABD pad Edema Control 3 Layer Compression System - Right Lower Extremity Avoid standing for long periods of time Elevate legs to the level of the heart or above for 30 minutes daily and/or when sitting, a frequency of: - throughout the day Exercise regularly [place] to [place] for [place] and Therapies Arterial Studies- Bilateral with ABIs and TBIs - Necrotic ulcer on right  lower leg, non compressible ABIs in clinic Electronic Signature(s) Signed: 08/06/2019 5:46:17 PM By: [name] MD Signed: 08/06/2019 5:59:11 PM By: [name]  RN, BSN Entered By: [name] on 08/06/2019 12:20:11 -------------------------------------------------------------------------------- Prescription 08/06/2019 Patient Name: [name] Provider: [name] MD Date of Birth: 1946-01-30 NPI#: [id number] Sex: M DEA#: [id number] Phone #: [phone] License #: [id number] Patient Address: [place] [facility] [place] [address] [place], [place] [address] [place], [place] [address] [phone] Allergies Elavil Reaction: hallucination Severity: Moderate Provider's Orders Arterial Studies- Bilateral with ABIs and TBIs - Necrotic ulcer on right lower leg, non compressible ABIs in clinic Signature(s): Date(s): Electronic Signature(s) Signed: 08/06/2019 5:46:17 PM By: [name] MD Signed: 08/06/2019 5:59:11 PM By: [name] RN, BSN Entered By: [name] on 08/06/2019 12:20:12 --------------------------------------------------------------------------------  Problem List Details Patient Name: Date of Service: MARCELLIS, FRAMPTON 08/06/2019 10:30 AM Medical Record [id number] Patient Account Number: [id number] Date of Birth/Sex: Treating RN: 1946-03-17 (73 y.o. [name] Primary Care Provider: [name] Other Clinician: Referring Provider: Treating Provider/Extender:Robson, [name], [name] in Treatment: 0 Active Problems ICD-10 Evaluated Encounter Code Description Active Date Today Diagnosis S80.11XD Contusion of right lower leg, subsequent encounter 08/06/2019 No Yes L97.212 Non-pressure chronic ulcer of right calf with fat layer 08/06/2019 No Yes exposed I87.321 Chronic venous hypertension (idiopathic) with 08/06/2019 No  Yes inflammation of right lower extremity E11.622 Type 2 diabetes mellitus with other skin ulcer 08/06/2019 No Yes Inactive Problems Resolved Problems Electronic Signature(s) Signed: 08/06/2019 5:46:17 PM By: [name] MD Entered By: [name] on 08/06/2019 12:33:39 -------------------------------------------------------------------------------- Progress Note Details Patient Name: Date of Service: DAILAN, PFALZGRAF 08/06/2019 10:30 AM Medical Record [id number] Patient Account Number: [id number] Date of Birth/Sex: Treating RN: [birth year] (73 y.o. [name] Primary Care Provider: [name] Other Clinician: Referring Provider: Treating Provider/Extender:Robson, [name], [name] in Treatment: 0 Subjective Chief Complaint Information obtained from Patient 08/06/2019; patient returns to clinic with a fairly substantial wound on the right posterior calf History of Present Illness (HPI) [place] HPI Description: 73 year old gentleman who was recently seen by his nephrologist [name], and noted to have a wound on his left lower extremity which was lacerated 2 months ago and now has reopened. The patient's left shin has a ulceration with some exudate but no evidence of infection and he was referred to [place] for further care as it was known that the patient has had some peripheral vascular disease in the past. Past medical history significant for chronic kidney disease, atrial fibrillation, diabetes mellitus,status post kidney transplant in 1983 and 2005, a week fistula graft placement, status post previous bowel surgery. 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 10/06/2015 -- x-ray of the left tibia and fibula shows no evidence of osteomyelitis. The patient has also had Doppler studies of his extremity and is awaiting the appointment with the vascular surgeon. We have not yet received these  reports. 10/13/2015 -- lower extremity venous duplex reflux evaluation shows reflux in the left common femoral vein, left saphenofemoral junction and the proximal greater saphenous vein extending to the proximal calf. There is also reflux in the left proximal to mid small saphenous vein. Arterial duplex studies done showed the resting ABI was not applicable due to tibial artery medial calcification. 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 The right TBI was 1 which is normal and the left ABI was 1 which is normal.  Patient has otherwise been doing fine and has been compliant with his dressings. 10/20/2015 -- He was seen by [name] recently for a vascular opinion on 10/15/2015. His left lower extremity venous insufficiency duplex study revealed GSV reflux,SS vein reflux and deep venous reflux in the common femoral vein. His ABIs were non compressible and his TBI on the right was 1.01 and on the left was 0.80. He was asked to continue with the wound care with compressive therapy followed by EVLA of the left GS vein 3 months. He recommended 20-30 mm thigh-high compression stockings and the need for a three-month trial of this. The patient had an Unna boot applied at the vascular office but he could not tolerate this with a lot of pain and issues with his toes and hence came here on Friday for removal of this and we reapplied a 2 layer compression. 11/10/2015 -- patient still has not purchased his 20-30 mm thigh-high compression stockings as prescribed by [name]. Readmission: 08/08/18 on evaluation today patient presents for readmission concerning a new injury to the left anterior lower extremity. He was previously seen in 2017 here in our clinic. He states that he has done fairly well since that point. Nonetheless he is having at this time some pain but states that he  hit this on a table that fell over and actually struck his leg. This appears to have pulled back some of his skin which folded in on itself and is causing some difficulty as far as that is concerned. There does not appear to be any evidence of infection at this time. No fevers, chills, nausea, or vomiting noted at this time. He's been using dressings on his own currently without complication. 08/15/18 on evaluation today patient actually appears to be doing somewhat better in regard to his wound of the lower [name] when compared to the first visit last week. I had to do a much more extensive debridement at that time it does appear that I'm gonna have to perform some debridement today but it does not look to be as extensive by any means. Nonetheless fortunately he does not show any signs of infection he does have discomfort at this site. I believe based on what I'm seeing currently he may benefit from Iodoflex to help keep the wound bed clean. Patient tolerated therapy without complication. Upon evaluation today the patient actually appears to be doing excellent in regard to his left lower extremity ulcer. This is much better than the previous two visits where he had a lot of necrotic tissue around the edge of the wound simply due to the fact that again there was a significant skin tear where the edge had been cleared away prior to reattaching and being able to heal appropriately. He seems to be doing much better at this point. 08/28/18 on evaluation today the patient's wound actually does appear to be showing signs of improvement. With that being said though he is improving he would likely note even greater improvement if we were able to sharply debride the wound. Nonetheless this caused him to much discomfort he tells me. 09/04/18 on evaluation today patient actually appears to be showing signs of improvement in regard to his left lower extremity ulcer. He has been tolerating the dressing changes  including the wrap although he tells me at this point that the burning does last for a couple of days even with just the Iodoflex. I was afraid that this may been part of the issue that  he was having with discomfort. It does seem to be the case. Nonetheless he shows no signs of evidence of infection at this time which is good news. No fevers chills noted ADMISSION to [name] 10/05/2018 This is a patient who was cared for in 2017 and again in the fall of this year at our sister clinic and [place]. He actually lives in [place] in [place]. We have been dealing with an apparent traumatic area on the left anterior tibial area. This is been present for the last several months. He was supposed to be using Iodoflex Kerlix and Coban however he was hospitalized from 09/05/2018 through 09/11/2018 with delirium secondary to pneumonia. Since then he is only been putting Vaseline gauze on this without compression. He also has a more recent skin tear on the dorsal right hand that may have only happened in the last week. The patient had arterial studies done in 2017 in January which was 3 years ago. At that point he had noncompressible ABIs but really quite good TBI's both normal. Triphasic waveforms on the right monophasic at the left posterior tibial but triphasic at the left dorsalis pedis. His ABIs in our clinic today were both noncompressible 1/23; the patient has wounds on the right dorsal hand just distal to the wrist and on the left anterior lower extremity. Both of these look very healthy he is using Hydrofera Blue 1/30; left anterior lower extremity wound much smaller. Healthy looking surface. The laceration area just distal to the wrist on the dorsal hand on the right is also just about closed I used Hydrofera Blue here 2/6; left anterior lower extremity wound is much smaller but still open. The laceration area just distal to the wrist on the dorsal hand is fully  epithelialized. 2/13; the patient's anterior lower extremity wound is closed. The laceration just distal to the wrist on the dorsal hand is also fully epithelialized and closed. The patient has external compression stockings which I think are 20/30 READMISSION 08/06/2019 Mr. Paolucci is a 73 year old man with had several times previously in our clinic. He is a diabetic with a history of chronic renal insufficiency status post kidney transplant in 1983 and again in 2005. He was then in 2017 with a laceration on the left lower extremity. He was worked up at the time with arterial studies and reflux studies. The arterial studies showed ABIs to be noncompressible but TBI's were within normal limits. I do not have the reflux studies at the moment. He was also sent here in 2019 with a left lower extremity wound and then again in 2020 with left lower extremity trauma a skin tear on the wrist. He was discharged with 20/30 stockings identified from myself that that might not be enough compression. Nevertheless he states he was wearing these fairly reliably. In September he had a fall with a substantial bruise in the area of the wound. He says he saw orthopedics and they told him there was some muscle strain sometime it later this opened into a wound. He has a fairly substantial wound on the right posterior calf. Satellite areas around this including medially and posteriorly. He has not worn his stockings since the injury Past medical history; includes chronic renal failure secondary to diabetes with kidney transplant x2, atrial fibrillation, heart failure with preserved ejection fraction, coronary artery disease. ABIs on the right in our clinic were once again noncompressible Patient History Information obtained from Patient. Allergies Elavil (Severity: Moderate, Reaction: hallucination) Family History  Diabetes - Siblings, Heart Disease - Father, Stroke - Mother,Maternal Grandparents, No family  history of Cancer, Hereditary Spherocytosis, Hypertension, Kidney Disease, Lung Disease, Seizures, Thyroid Problems, Tuberculosis. Social History Former smoker - quit 30 yrs ago, Marital Status - Married, Alcohol Use - Never, Drug Use - No History, Caffeine Use - Daily - coffee. Medical History Eyes Patient has history of Cataracts Denies history of Glaucoma, Optic Neuritis Ear/Nose/Mouth/Throat Denies history of Chronic sinus problems/congestion, Middle ear problems Hematologic/Lymphatic Patient has history of Anemia Denies history of Hemophilia, Human Immunodeficiency Virus, Lymphedema, Sickle Cell Disease Respiratory Denies history of Aspiration, Asthma, Chronic Obstructive Pulmonary Disease (COPD), Pneumothorax, Sleep Apnea, Tuberculosis Cardiovascular Patient has history of Arrhythmia - afib, Congestive Heart Failure, Hypertension Denies history of Angina, Coronary Artery Disease, Deep Vein Thrombosis, Hypotension, Myocardial Infarction, Peripheral Arterial Disease, Peripheral Venous Disease, Phlebitis Gastrointestinal Denies history of Cirrhosis , Colitis, Crohnoos, Hepatitis A, Hepatitis B, Hepatitis C Endocrine Patient has history of Type II Diabetes Denies history of Type I Diabetes Genitourinary Denies history of End Stage Renal Disease Immunological Denies history of Lupus Erythematosus, Raynaudoos, Scleroderma Integumentary (Skin) Denies history of History of Burn Musculoskeletal Patient has history of Gout Denies history of Rheumatoid Arthritis, Osteoarthritis, Osteomyelitis Neurologic Patient has history of Neuropathy Denies history of Dementia, Quadriplegia, Paraplegia, Seizure Disorder Oncologic Denies history of Received Chemotherapy, Received Radiation Psychiatric Denies history of Anorexia/bulimia, Confinement Anxiety Hospitalization/Surgery History - kidney transplant x2. - av fistula right arm. - achilles tendon repair. - right arm surgery. Medical  And Surgical History Notes Cardiovascular hyperlipidemia Genitourinary hx kidney transplant Review of Systems (ROS) Constitutional Symptoms (General Health) Denies complaints or symptoms of Fatigue, Fever, Chills, Marked Weight Change. Eyes Denies complaints or symptoms of Dry Eyes, Vision Changes, Glasses / Contacts. Ear/Nose/Mouth/Throat Denies complaints or symptoms of Chronic sinus problems or rhinitis. Respiratory Denies complaints or symptoms of Chronic or frequent coughs, Shortness of Breath. Cardiovascular Denies complaints or symptoms of Chest pain. Gastrointestinal Denies complaints or symptoms of Frequent diarrhea, Nausea, Vomiting. Endocrine Denies complaints or symptoms of Heat/cold intolerance. Genitourinary Denies complaints or symptoms of Frequent urination. Integumentary (Skin) Complains or has symptoms of Wounds. Musculoskeletal Denies complaints or symptoms of Muscle Pain, Muscle Weakness. Neurologic Denies complaints or symptoms of Numbness/parasthesias. Psychiatric Denies complaints or symptoms of Claustrophobia, Suicidal. Objective Constitutional Sitting or standing Blood Pressure is within target range for patient.. Pulse regular and within target range for patient.Marland Kitchen Respirations regular, non-labored and within target range.. Temperature is normal and within the target range for the patient.Marland Kitchen Appears in no distress. Vitals Time Taken: 11:16 AM, Height: 67 in, Source: Stated, Weight: 190 lbs, Source: Stated, BMI: 29.8, Temperature: 98.2 F, Pulse: 51 bpm, Respiratory Rate: 18 breaths/min, Blood Pressure: 109/90 mmHg, Capillary Blood Glucose: 130 mg/dl. General Notes: CBG per patient Eyes Conjunctivae clear. No discharge.no icterus. Respiratory work of breathing is normal. Bilateral breath sounds are clear and equal in all lobes with no wheezes, rales or rhonchi.. Cardiovascular Heart rhythm and rate regular, without murmur or gallop. JVP elevated to  the angle of the jaw no S3. Pedal pulses palpable but bilaterally reduced. Popliteal pulses not palpable. Severe bilateral hemosiderin deposition bilateral 3+ pitting edema that actually on the right goes right up into his thighs. Gastrointestinal (GI) Very distended striations. Surgical scar. No shifting dullness. No liver or spleen enlargement. Psychiatric appears at normal baseline. General Notes: Wound exam; the patient has a substantial wound area in the right posterior mid calf. Satellite reasons around this inferiorly and anterior. Very adherent  necrotic material attempted debridement with a #5 curette of very adherent necrotic material. Unfortunately this will require further debridement. There is no evidence of surrounding infection. No calf tenderness Integumentary (Hair, Skin) Hemosiderin deposition bilateral. Wound #3 status is Open. Original cause of wound was Trauma. The wound is located on the Right,Posterior Lower Leg. The wound measures 8.2cm length x 3.5cm width x 0.3cm depth; 22.541cm^2 area and 6.762cm^3 volume. There is no tunneling or undermining noted. There is a medium amount of serosanguineous drainage noted. There is no granulation within the wound bed. There is a large (67-100%) amount of necrotic tissue within the wound bed including Eschar and Adherent Slough. Wound #4 status is Open. Original cause of wound was Trauma. The wound is located on the Right,Lateral Lower Leg. The wound measures 2.4cm length x 0.6cm width x 0.1cm depth; 1.131cm^2 area and 0.113cm^3 volume. There is Fat Layer (Subcutaneous Tissue) Exposed exposed. There is a medium amount of serosanguineous drainage noted. There is no granulation within the wound bed. There is a large (67-100%) amount of necrotic tissue within the wound bed including Eschar and Adherent Slough. 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 There was a Excisional Skin/Subcutaneous Tissue Debridement with a total area of 28.7 sq cm performed by Ricard Dillon., MD. With the following instrument(s): Curette to remove Viable and Non-Viable tissue/material. Material removed includes Subcutaneous Tissue and Slough and after achieving pain control using Other (Benzocaine 20%). No specimens were taken. A time out was conducted at 12:12, prior to the start of the procedure. A Moderate amount of bleeding was controlled with Pressure. The procedure was tolerated well with a pain level of 4 throughout and a pain level of 2 following the procedure. Post Debridement Measurements: 8.2cm length x 3.5cm width x 0.3cm depth; 6.762cm^3 volume. Character of Wound/Ulcer Post Debridement is improved. Severity of Tissue Post Debridement is: Fat layer exposed. Post procedure Diagnosis Wound #3: Same as Pre-Procedure Pre-procedure diagnosis of Wound #3 is a Venous Leg Ulcer located on the Right,Posterior Lower Leg . There was a Three Layer Compression Therapy Procedure by [name], RN. Post procedure Diagnosis Wound #3: Same as Pre-Procedure Wound #4 Pre-procedure diagnosis of Wound #4 is a Venous Leg Ulcer located on the Right,Lateral Lower Leg . There was a Three Layer Compression Therapy Procedure by [name], RN. Post procedure Diagnosis Wound #4: Same as Pre-Procedure Plan Follow-up Appointments: Return Appointment in 1 week. Dressing Change Frequency: Wound #3 Right,Posterior Lower Leg: Change dressing three times week. - 2x by home health on Wednesdays and Fridays, 1x by wound clinic  on Mondays Wound #4 Right,Lateral Lower Leg: Change dressing three times week. - 2x by home health on Wednesdays and Fridays, 1x by wound clinic on Mondays Skin Barriers/Peri-Wound Care: Barrier cream Moisturizing lotion Wound Cleansing: Clean wound with Normal Saline. - or normal saline on days that dressing is changed May shower with protection. Primary Wound Dressing: Wound #3 Right,Posterior Lower Leg: Iodoflex Wound #4 Right,Lateral Lower Leg: Iodoflex Secondary Dressing: Wound #3 Right,Posterior Lower Leg: ABD pad Kerramax - or Zetuvit (or other super absorbent dressing) Wound #4 Right,Lateral Lower Leg: ABD pad Edema  Control: 3 Layer Compression System - Right Lower Extremity Avoid standing for long periods of time Elevate legs to the level of the heart or above for 30 minutes daily and/or when sitting, a frequency of: - throughout the day Exercise regularly Home Health: Admit to [facility] for [facility] and Therapies ordered were: Arterial Studies- Bilateral with ABIs and TBIs - Necrotic ulcer on right lower leg, non compressible ABIs in clinic 1. Iodoflex to the wound areas. 2. ABDs under 3 layer compression 3. I have reordered arterial studies on this man specifically looked at the TBI's and waveforms. His arterial ABIs are noncompressible 4. I will see what I can find from the reflux studies from 2017. 5. The patient is systemically fluid volume overloaded with edema in the right greater than left leg that extends into the thighs bilaterally. He also has coccyx edema. I do not think this is a DVT at least not in the legs. I have asked him to make an appointment with whoever manages his diuretic currently at 40/20 on a twice daily basis. He sees Dr. Tamala Julian of cardiology and Dr. Loletha Grayer of nephrology. Electronic Signature(s) Signed: 08/06/2019 5:46:17 PM By: [name] MD Entered By: [name] on 08/06/2019  12:52:03 -------------------------------------------------------------------------------- HxROS Details Patient Name: Date of Service: DAVIN, ARCHULETTA 08/06/2019 10:30 AM Medical Record [id number] Patient Account Number: [id number] Date of Birth/Sex: Treating RN: Jan 29, 1946 (73 y.o. Oval Linsey Primary Care Provider: [name] Other Clinician: Referring Provider: Treating Provider/Extender:Robson, [name], [name] in Treatment: 0 Information Obtained From Patient Constitutional Symptoms (General Health) Complaints and Symptoms: Negative for: Fatigue; Fever; Chills; Marked Weight Change Eyes Complaints and Symptoms: Negative for: Dry Eyes; Vision Changes; Glasses / Contacts Medical History: Positive for: Cataracts Negative for: Glaucoma; Optic Neuritis Ear/Nose/Mouth/Throat Complaints and Symptoms: Negative for: Chronic sinus problems or rhinitis Medical History: Negative for: Chronic sinus problems/congestion; Middle ear problems Respiratory Complaints and Symptoms: Negative for: Chronic or frequent coughs; Shortness of Breath Medical History: Negative for: Aspiration; Asthma; Chronic Obstructive Pulmonary Disease (COPD); Pneumothorax; Sleep Apnea; Tuberculosis Cardiovascular Complaints and Symptoms: Negative for: Chest pain Medical History: Positive for: Arrhythmia - afib; Congestive Heart Failure; Hypertension Negative for: Angina; Coronary Artery Disease; Deep Vein Thrombosis; Hypotension; Myocardial Infarction; Peripheral Arterial Disease; Peripheral Venous Disease; Phlebitis Past Medical History Notes: hyperlipidemia Gastrointestinal Complaints and Symptoms: Negative for: Frequent diarrhea; Nausea; Vomiting Medical History: Negative for: Cirrhosis ; Colitis; Crohns; Hepatitis A; Hepatitis B; Hepatitis C Endocrine Complaints and Symptoms: Negative for: Heat/cold intolerance Medical History: Positive for: Type II  Diabetes Negative for: Type I Diabetes Time with diabetes: 1983 Treated with: Insulin, Oral agents Blood sugar tested every day: Yes Tested : daily Blood sugar testing results: Breakfast: 75-120; Bedtime: 180 Genitourinary Complaints and Symptoms: Negative for: Frequent urination Medical History: Negative for: End Stage Renal Disease Past Medical History Notes: hx kidney transplant Integumentary (Skin) Complaints and Symptoms: Positive for: Wounds Medical History: Negative for: History of Burn Musculoskeletal Complaints and Symptoms: Negative for: Muscle Pain; Muscle Weakness Medical History: Positive for: Gout Negative for: Rheumatoid Arthritis; Osteoarthritis; Osteomyelitis Neurologic Complaints and Symptoms: Negative for: Numbness/parasthesias Medical History: Positive for: Neuropathy Negative for: Dementia; Quadriplegia; Paraplegia; Seizure Disorder Psychiatric Complaints and Symptoms: Negative for: Claustrophobia; Suicidal Medical History: Negative for: Anorexia/bulimia; Confinement Anxiety Hematologic/Lymphatic Medical History: Positive for: Anemia Negative for: Hemophilia; Human Immunodeficiency Virus; Lymphedema; Sickle Cell Disease Immunological Medical History: Negative for: Lupus Erythematosus; Raynauds; Scleroderma Oncologic Medical History: Negative for: Received Chemotherapy; Received Radiation HBO Extended History  Items Eyes: Cataracts Immunizations Pneumococcal Vaccine: Received Pneumococcal Vaccination: Yes Implantable Devices None Hospitalization / Surgery History Type of Hospitalization/Surgery kidney transplant x2 av fistula right arm achilles tendon repair right arm surgery Family and Social History Cancer: No; Diabetes: Yes - Siblings; Heart Disease: Yes - Father; Hereditary Spherocytosis: No; Hypertension: No; Kidney Disease: No; Lung Disease: No; Seizures: No; Stroke: Yes - Mother,Maternal Grandparents; Thyroid Problems: No;  Tuberculosis: No; Former smoker - quit 30 yrs ago; Marital Status - Married; Alcohol Use: Never; Drug Use: No History; Caffeine Use: Daily - coffee; Financial Concerns: No; Food, Clothing or Shelter Needs: No; Support System Lacking: No; Transportation Concerns: No Electronic Signature(s) Signed: 08/06/2019 5:46:17 PM By: [name] MD Signed: 08/28/2019 2:50:47 PM By: Carlene Coria RN Entered By: Carlene Coria on 08/06/2019 11:20:48 -------------------------------------------------------------------------------- SuperBill Details Patient Name: Date of Service: RAHIEM, SCHELLINGER 08/06/2019 Medical Record [id number] Patient Account Number: [id number] Date of Birth/Sex: Treating RN: 10-10-1945 (73 y.o. 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 E11.622 Type 2 diabetes mellitus with other skin ulcer Facility Procedures CPT4 Code Description: [id number] 99213 - WOUND CARE VISIT-LEV 3 EST PT Modifier: 25 Quantity: 1 CPT4 Code Description: [id number] [place] - DEB SUBQ TISSUE 20 SQ CM/< ICD-10 Diagnosis Description L97.212 Non-pressure chronic ulcer of right calf with fat layer Modifier: exposed Quantity: 1 CPT4 Code Description: [id number] 11045 - DEB SUBQ TISS EA ADDL 20CM ICD-10 Diagnosis Description L97.212 Non-pressure chronic ulcer of right calf with fat layer Modifier: exposed Quantity: 1 Physician Procedures CPT4 Code Description: [id number] 99214 - WC PHYS LEVEL 4 - EST PT ICD-10 Diagnosis Description S80.11XD Contusion of right lower leg, subsequent encounter L97.212  Non-pressure chronic ulcer of right calf with fat layer e I87.321 Chronic venous hypertension  (idiopathic) with inflammatio extremity E11.622 Type 2 diabetes mellitus with other skin ulcer Modifier: 25 xposed n of right low Quantity: 1 er CPT4 Code Description: [id number] 11042 - WC PHYS SUBQ TISS 20 SQ CM ICD-10 Diagnosis Description L97.212 Non-pressure chronic ulcer of right calf with fat layer ex Modifier: posed Quantity: 1 CPT4 Code Description: [id number] 11045 - WC PHYS SUBQ TISS EA ADDL 20 CM ICD-10 Diagnosis Description L97.212 Non-pressure chronic ulcer of right calf with fat layer expo Modifier: 1 sed Quantity: Electronic Signature(s) Signed: 08/08/2019 10:22:54 AM By: Maye Hides Signed: 08/09/2019 6:25:25 PM By: [name] MD Previous Signature: 08/08/2019 10:19:29 AM Version By: Maye Hides Previous Signature: 08/06/2019 5:46:17 PM Version By: [name] MD Entered By: Maye Hides on 08/08/2019 10:22:54

## 2019-08-28 NOTE — Progress Notes (Signed)
SYMEON, PULEO ([id number]) Visit Report for 08/27/2019 HPI Details Patient Name: Date of Service: [name], [name] 08/27/2019 9:30 AM Medical Record [id number] Patient Account Number: [id number] Date of Birth/Sex: Treating RN: 1945-12-20 (73 y.o. M) Primary Care Provider: [name] Other Clinician: Referring Provider: Treating Provider/Extender:, [name], [name] in Treatment: 3 History of Present Illness HPI Description: [name] HPI Description: 73 year old gentleman who was recently seen by his nephrologist [name], and noted to have a wound on his left lower extremity which was lacerated 2 months ago and now has reopened. The patient's left shin has a ulceration with some exudate but no evidence of infection and he was referred to [place] for further care as it was known that the patient has had some peripheral vascular disease in the past. Past medical history significant for chronic kidney disease, atrial fibrillation, diabetes mellitus,status post kidney transplant in 1983 and 2005, a week fistula graft placement, status post previous bowel surgery. 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 10/06/2015 -- x-ray of the left tibia and fibula shows no evidence of osteomyelitis. The patient has also had Doppler studies of his extremity and is awaiting the appointment with the vascular surgeon. We have not yet received these reports. 10/13/2015 -- lower extremity venous duplex reflux evaluation shows reflux in the left common femoral vein, left saphenofemoral junction and the proximal greater saphenous vein extending to the proximal calf. There is also reflux in the left proximal to mid small saphenous vein. Arterial duplex studies done showed the resting ABI was not applicable due to tibial artery medial calcification. The left ABI was 0.8 using the Doppler dorsalis pedis indicating mild arterial  occlusive disease at rest with the posterior tibial artery noted to be noncompressible. The right TBI was 1 which is normal and the left ABI was 1 which is normal. Patient has otherwise been doing fine and has been compliant with his dressings. 10/20/2015 -- He was seen by [name] recently for a vascular opinion on 10/15/2015. His left lower extremity venous insufficiency duplex study revealed GSV reflux,SS vein reflux and deep venous reflux in the common femoral vein. His ABIs were non compressible and his TBI on the right was 1.01 and on the left was 0.80. He was asked to continue with the wound care with compressive therapy followed by EVLA of the left GS vein 3 months. He recommended 20-30 mm thigh-high compression stockings and the need for a three-month trial of this. The patient had an Unna boot applied at the vascular office but he could not tolerate this with a lot of pain and issues with his toes and hence came here on Friday for removal of this and we reapplied a 2 layer compression. 11/10/2015 -- patient still has not purchased his 20-30 mm thigh-high compression stockings as prescribed by [name]. Readmission: 08/08/18 on evaluation today patient presents for readmission concerning a new injury to the left anterior lower extremity. He was previously seen in 2017 here in our clinic. He states that he has done fairly well since that point. Nonetheless he is having at this time some pain but states that he hit this on a table that fell over and actually struck his leg. This appears to have pulled back some of his skin which folded in on itself and is causing some difficulty as far as that is concerned. There does not appear to be any evidence of infection at  this time. No fevers, chills, nausea, or vomiting noted at this time. He's been using dressings on his own currently without complication. 08/15/18 on evaluation today patient actually appears to be doing somewhat better in  regard to his wound of the lower [name] when compared to the first visit last week. I had to do a much more extensive debridement at that time it does appear that I'm gonna have to perform some debridement today but it does not look to be as extensive by any means. Nonetheless fortunately he does not show any signs of infection he does have discomfort at this site. I believe based on what I'm seeing currently he may benefit from Iodoflex to help keep the wound bed clean. Patient tolerated therapy without complication. Upon evaluation today the patient actually appears to be doing excellent in regard to his left lower extremity ulcer. This is much better than the previous two visits where he had a lot of necrotic tissue around the edge of the wound simply due to the fact that again there was a significant skin tear where the edge had been cleared away prior to reattaching and being able to heal appropriately. He seems to be doing much better at this point. 08/28/18 on evaluation today the patient's wound actually does appear to be showing signs of improvement. With that being said though he is improving he would likely note even greater improvement if we were able to sharply debride the wound. Nonetheless this caused him to much discomfort he tells me. 09/04/18 on evaluation today patient actually appears to be showing signs of improvement in regard to his left lower extremity ulcer. He has been tolerating the dressing changes including the wrap although he tells me at this point that the burning does last for a couple of days even with just the Iodoflex. I was afraid that this may been part of the issue that he was having with discomfort. It does seem to be the case. Nonetheless he shows no signs of evidence of infection at this time which is good news. No fevers chills noted ADMISSION to [name] 10/05/2018 This is a patient who was cared for in 2017 and again in the fall of  this year at our sister clinic and [facility]. He actually lives in [place] in [place]. We have been dealing with an apparent traumatic area on the left anterior tibial area. This is been present for the last several months. He was supposed to be using Iodoflex Kerlix and Coban however he was hospitalized from 09/05/2018 through 09/11/2018 with delirium secondary to pneumonia. Since then he is only been putting Vaseline gauze on this without compression. He also has a more recent skin tear on the dorsal right hand that may have only happened in the last week. The patient had arterial studies done in 2017 in January which was 3 years ago. At that point he had noncompressible ABIs but really quite good TBI's both normal. Triphasic waveforms on the right monophasic at the left posterior tibial but triphasic at the left dorsalis pedis. His ABIs in our clinic today were both noncompressible 1/23; the patient has wounds on the right dorsal hand just distal to the wrist and on the left anterior lower extremity. Both of these look very healthy he is using Hydrofera Blue 1/30; left anterior lower extremity wound much smaller. Healthy looking surface. The laceration area just distal to the wrist on the dorsal hand on the right is also just about closed  I used Hydrofera Blue here 2/6; left anterior lower extremity wound is much smaller but still open. The laceration area just distal to the wrist on the dorsal hand is fully epithelialized. 2/13; the patient's anterior lower extremity wound is closed. The laceration just distal to the wrist on the dorsal hand is also fully epithelialized and closed. The patient has external compression stockings which I think are 20/30 READMISSION 08/06/2019 [name] is a 73 year old man with had several times previously in our clinic. He is a diabetic with a history of chronic renal insufficiency status post kidney transplant in 1983 and again in 2005. He was then in  2017 with a laceration on the left lower extremity. He was worked up at the time with arterial studies and reflux studies. The arterial studies showed ABIs to be noncompressible but TBI's were within normal limits. I do not have the reflux studies at the moment. He was also sent here in 2019 with a left lower extremity wound and then again in 2020 with left lower extremity trauma a skin tear on the wrist. He was discharged with 20/30 stockings identified from myself that that might not be enough compression. Nevertheless he states he was wearing these fairly reliably. In September he had a fall with a substantial bruise in the area of the wound. He says he saw orthopedics and they told him there was some muscle strain sometime it later this opened into a wound. He has a fairly substantial wound on the right posterior calf. Satellite areas around this including medially and posteriorly. He has not worn his stockings since the injury Past medical history; includes chronic renal failure secondary to diabetes with kidney transplant x2, atrial fibrillation, heart failure with preserved ejection fraction, coronary artery disease. ABIs on the right in our clinic were once again noncompressible 08/13/2019 on evaluation today patient appears to be doing decently well with regard to his wound compared to last week's evaluation. Unfortunately he is still having a lot of discomfort at this point which is I think in some part due to the 3 layer compression wrap which is a little bit stronger I think for him. When he was here before we actually utilized a Kerlix and Coban wrap which he states seemed to got a little bit better. Nonetheless I think we can probably drop back to this in light of the discomfort that he had. Nonetheless the pain was not really right around the wound itself as much as it was around the ankle in particular. The Iodoflex does seem to have done well for him As the wound is appearing  somewhat better today which is excellent news. 11/30; still complaining of a lot of pain. Apparently arterial studies I ordered 2 weeks ago are below. I do not believe we have an appointment with vein and vascular as of yet; ABI Findings: +---------+------------------+-----+----------+--------+ Right Rt Pressure (mmHg)IndexWaveform Comment  +---------+------------------+-----+----------+--------+ PTA >254 1.50 monophasic  +---------+------------------+-----+----------+--------+ DP >254 1.50 monophasic  +---------+------------------+-----+----------+--------+ Great Toe68 0.40 Abnormal   +---------+------------------+-----+----------+--------+ +---------+------------------+-----+----------+-------+ Left Lt Pressure (mmHg)IndexWaveform Comment +---------+------------------+-----+----------+-------+ Brachial 169     +---------+------------------+-----+----------+-------+ PTA >254 1.50 monophasic  +---------+------------------+-----+----------+-------+ DP >254 1.50 monophasic  +---------+------------------+-----+----------+-------+ Great Toe40 0.24 Abnormal   +---------+------------------+-----+----------+-------+ Pedal arteries appear hyperemic. Patient refused Brachial pressure in the right arm. Summary: Right: Resting right ankle-brachial index indicates noncompressible right lower extremity arteries. The right toe- brachial index is abnormal. Left: Resting left ankle-brachial index indicates noncompressible left lower extremity arteries. The left toe-brachial index is abnormal. He is  also having considerably more swelling in his left calf. This was not there when I last saw him 2 weeks ago. He tells me that some of the home health compression wraps have been slipping down and that may be the issue here however a month I am uncertain 62/2; sees vascular on December 22. Still complaining of a lot of pain. DVT study I did last time  was negative for DVT Electronic Signature(s) Signed: 08/28/2019 10:01:17 AM By: [name] MD Entered By: [name] on 08/27/2019 11:09:16 -------------------------------------------------------------------------------- Physical Exam Details Patient Name: Date of Service: XZANDER, GILHAM 08/27/2019 9:30 AM Medical Record [id number] Patient Account Number: [id number] Date of Birth/Sex: Treating RN: 1945-10-25 (73 y.o. M) Primary Care Provider: [name] Other Clinician: Referring Provider: Treating Provider/Extender:, [name], [name] in Treatment: 3 Constitutional Patient is hypertensive.. Pulse regular and within target range for patient.Marland Kitchen Respirations regular, non-labored and within target range.. Temperature is normal and within the target range for the patient.Marland Kitchen Appears in no distress. Eyes Conjunctivae clear. No discharge.no icterus. Respiratory work of breathing is normal. Cardiovascular Popliteal pulses not palpable. Dorsalis pedis pulses present on the right. But the posterior tibial is not palpable. Integumentary (Hair, Skin) No erythema around the wound. Psychiatric appears at normal baseline. Notes Wound exam; again an improvement in the slough however the wound is still substantial. Electronic Signature(s) Signed: 08/28/2019 10:01:17 AM By: [name] MD Entered By: [name] on 08/27/2019 11:10:38 -------------------------------------------------------------------------------- Physician Orders Details Patient Name: Date of Service: LUMAN, HOLWAY 08/27/2019 9:30 AM Medical Record [id number] Patient Account Number: [id number] Date of Birth/Sex: Treating RN: 13-May-1946 (73 y.o. [name] Primary Care Provider: [name] Other Clinician: Referring Provider: Treating Provider/Extender:, [name], [name] in Treatment: 3 Verbal / Phone Orders:  No Diagnosis Coding ICD-10 Coding Code Description S80.11XD Contusion of right lower leg, subsequent encounter L97.212 Non-pressure chronic ulcer of right calf with fat layer exposed I87.321 Chronic venous hypertension (idiopathic) with inflammation of right lower extremity E11.622 Type 2 diabetes mellitus with other skin ulcer Follow-up Appointments Return Appointment in 1 week. Dressing Change Frequency Wound #3 Right,Posterior Lower Leg Change dressing three times week. - 2x by home health on Wednesdays and Fridays, 1x by wound clinic on Mondays Skin Barriers/Peri-Wound Care Barrier cream Moisturizing lotion Wound Cleansing Clean wound with Normal Saline. - or normal saline on days that dressing is changed May shower with protection. Primary Wound Dressing Wound #3 Right,Posterior Lower Leg Iodoflex Secondary Dressing Wound #3 Right,Posterior Lower Leg ABD pad Kerramax - or Zetuvit (or other super absorbent dressing) Edema Control Kerlix and Coban - Right Lower Extremity Avoid standing for long periods of time Elevate legs to the level of the heart or above for 30 minutes daily and/or when sitting, a frequency of: - throughout the day Exercise regularly [facility] for wound care. - [facility] Electronic Signature(s) Signed: 08/27/2019 5:51:44 PM By: [name] RN, BSN Signed: 08/28/2019 10:01:17 AM By: [name] MD Entered By: [name] on 08/27/2019 10:36:45 -------------------------------------------------------------------------------- Problem List Details Patient Name: Date of Service: KASTEN, LEVEQUE 08/27/2019 9:30 AM Medical Record [id number] Patient Account Number: [id number] Date of Birth/Sex: Treating RN: 1946/09/17 (73 y.o. [name] Primary Care Provider: [name] Other Clinician: Referring Provider: Treating Provider/Extender:, [name], [name] in  Treatment: 3 Active Problems ICD-10 Evaluated Encounter Code Description Active Date Today Diagnosis S80.11XD Contusion of right lower leg, subsequent encounter 08/06/2019 No Yes L97.212 Non-pressure  chronic ulcer of right calf with fat layer 08/06/2019 No Yes exposed I87.321 Chronic venous hypertension (idiopathic) with 08/06/2019 No Yes inflammation of right lower extremity E11.622 Type 2 diabetes mellitus with other skin ulcer 08/06/2019 No Yes Inactive Problems Resolved Problems Electronic Signature(s) Signed: 08/28/2019 10:01:17 AM By: [name] MD Entered By: [name] on 08/27/2019 11:08:19 -------------------------------------------------------------------------------- Progress Note Details Patient Name: Date of Service: TIMOFEY, CARANDANG 08/27/2019 9:30 AM Medical Record [id number] Patient Account Number: [id number] Date of Birth/Sex: Treating RN: 1946/06/23 (73 y.o. M) Primary Care Provider: [name] Other Clinician: Referring Provider: Treating Provider/Extender:, [name], [name] in Treatment: 3 Subjective History of Present Illness (HPI) [place] HPI Description: 73 year old gentleman who was recently seen by his nephrologist [name], and noted to have a wound on his left lower extremity which was lacerated 2 months ago and now has reopened. The patient's left shin has a ulceration with some exudate but no evidence of infection and he was referred to [place] for further care as it was known that the patient has had some peripheral vascular disease in the past. Past medical history significant for chronic kidney disease, atrial fibrillation, diabetes mellitus,status post kidney transplant in 1983 and 2005, a week fistula graft placement, status post previous bowel surgery. 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 10/06/2015 -- x-ray of the left tibia and fibula shows no  evidence of osteomyelitis. The patient has also had Doppler studies of his extremity and is awaiting the appointment with the vascular surgeon. We have not yet received these reports. 10/13/2015 -- lower extremity venous duplex reflux evaluation shows reflux in the left common femoral vein, left saphenofemoral junction and the proximal greater saphenous vein extending to the proximal calf. There is also reflux in the left proximal to mid small saphenous vein. Arterial duplex studies done showed the resting ABI was not applicable due to tibial artery medial calcification. 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 The right TBI was 1 which is normal and the left ABI was 1 which is normal. Patient has otherwise been doing fine and has been compliant with his dressings. 10/20/2015 -- He was seen by [name] recently for a vascular opinion on 10/15/2015. His left lower extremity venous insufficiency duplex study revealed GSV reflux,SS vein reflux and deep venous reflux in the common femoral vein. His ABIs were non compressible and his TBI on the right was 1.01 and on the left was 0.80. He was asked to continue with the wound care with compressive therapy followed by EVLA of the left GS vein 3 months. He recommended 20-30 mm thigh-high compression stockings and the need for a three-month trial of this. The patient had an Unna boot applied at the vascular office but he could not tolerate this with a lot of pain and issues with his toes and hence came here on Friday for removal of this and we reapplied a 2 layer compression. 11/10/2015 -- patient still has not purchased his 20-30 mm thigh-high compression stockings as prescribed by [name]. Readmission: 08/08/18 on evaluation today patient presents for readmission concerning a new injury to the left anterior  lower extremity. He was previously seen in 2017 here in our clinic. He states that he has done fairly well since that point. Nonetheless he is having at this time some  pain but states that he hit this on a table that fell over and actually struck his leg. This appears to have pulled back some of his skin which folded in on itself and is causing some difficulty as far as that is concerned. There does not appear to be any evidence of infection at this time. No fevers, chills, nausea, or vomiting noted at this time. He's been using dressings on his own currently without complication. 08/15/18 on evaluation today patient actually appears to be doing somewhat better in regard to his wound of the lower [name] when compared to the first visit last week. I had to do a much more extensive debridement at that time it does appear that I'm gonna have to perform some debridement today but it does not look to be as extensive by any means. Nonetheless fortunately he does not show any signs of infection he does have discomfort at this site. I believe based on what I'm seeing currently he may benefit from Iodoflex to help keep the wound bed clean. Patient tolerated therapy without complication. Upon evaluation today the patient actually appears to be doing excellent in regard to his left lower extremity ulcer. This is much better than the previous two visits where he had a lot of necrotic tissue around the edge of the wound simply due to the fact that again there was a significant skin tear where the edge had been cleared away prior to reattaching and being able to heal appropriately. He seems to be doing much better at this point. 08/28/18 on evaluation today the patient's wound actually does appear to be showing signs of improvement. With that being said though he is improving he would likely note even greater improvement if we were able to sharply debride the wound. Nonetheless this caused him to much discomfort  he tells me. 09/04/18 on evaluation today patient actually appears to be showing signs of improvement in regard to his left lower extremity ulcer. He has been tolerating the dressing changes including the wrap although he tells me at this point that the burning does last for a couple of days even with just the Iodoflex. I was afraid that this may been part of the issue that he was having with discomfort. It does seem to be the case. Nonetheless he shows no signs of evidence of infection at this time which is good news. No fevers chills noted ADMISSION to [name] 10/05/2018 This is a patient who was cared for in 2017 and again in the fall of this year at our sister clinic and [place]. He actually lives in [place] in [place]. We have been dealing with an apparent traumatic area on the left anterior tibial area. This is been present for the last several months. He was supposed to be using Iodoflex Kerlix and Coban however he was hospitalized from 09/05/2018 through 09/11/2018 with delirium secondary to pneumonia. Since then he is only been putting Vaseline gauze on this without compression. He also has a more recent skin tear on the dorsal right hand that may have only happened in the last week. The patient had arterial studies done in 2017 in January which was 3 years ago. At that point he had noncompressible ABIs but really quite good TBI's both normal. Triphasic waveforms on the right monophasic at the left posterior tibial but triphasic at the left dorsalis pedis. His ABIs in our clinic today were both noncompressible 1/23; the patient has wounds on the right dorsal hand  just distal to the wrist and on the left anterior lower extremity. Both of these look very healthy he is using Hydrofera Blue 1/30; left anterior lower extremity wound much smaller. Healthy looking surface. The laceration area just distal to the wrist on the dorsal hand on the right is also just about  closed I used Hydrofera Blue here 2/6; left anterior lower extremity wound is much smaller but still open. The laceration area just distal to the wrist on the dorsal hand is fully epithelialized. 2/13; the patient's anterior lower extremity wound is closed. The laceration just distal to the wrist on the dorsal hand is also fully epithelialized and closed. The patient has external compression stockings which I think are 20/30 READMISSION 08/06/2019 [name] is a 74 year old man with had several times previously in our clinic. He is a diabetic with a history of chronic renal insufficiency status post kidney transplant in 1983 and again in 2005. He was then in 2017 with a laceration on the left lower extremity. He was worked up at the time with arterial studies and reflux studies. The arterial studies showed ABIs to be noncompressible but TBI's were within normal limits. I do not have the reflux studies at the moment. He was also sent here in 2019 with a left lower extremity wound and then again in 2020 with left lower extremity trauma a skin tear on the wrist. He was discharged with 20/30 stockings identified from myself that that might not be enough compression. Nevertheless he states he was wearing these fairly reliably. In September he had a fall with a substantial bruise in the area of the wound. He says he saw orthopedics and they told him there was some muscle strain sometime it later this opened into a wound. He has a fairly substantial wound on the right posterior calf. Satellite areas around this including medially and posteriorly. He has not worn his stockings since the injury Past medical history; includes chronic renal failure secondary to diabetes with kidney transplant x2, atrial fibrillation, heart failure with preserved ejection fraction, coronary artery disease. ABIs on the right in our clinic were once again noncompressible 08/13/2019 on evaluation today patient appears to be  doing decently well with regard to his wound compared to last week's evaluation. Unfortunately he is still having a lot of discomfort at this point which is I think in some part due to the 3 layer compression wrap which is a little bit stronger I think for him. When he was here before we actually utilized a Kerlix and Coban wrap which he states seemed to got a little bit better. Nonetheless I think we can probably drop back to this in light of the discomfort that he had. Nonetheless the pain was not really right around the wound itself as much as it was around the ankle in particular. The Iodoflex does seem to have done well for him As the wound is appearing somewhat better today which is excellent news. 11/30; still complaining of a lot of pain. Apparently arterial studies I ordered 2 weeks ago are below. I do not believe we have an appointment with vein and vascular as of yet; ABI Findings: +---------+------------------+-----+----------+--------+ Right Rt Pressure (mmHg)IndexWaveform Comment  +---------+------------------+-----+----------+--------+ PTA >254 1.50 monophasic  +---------+------------------+-----+----------+--------+ DP >254 1.50 monophasic  +---------+------------------+-----+----------+--------+ Great Toe68 0.40 Abnormal   +---------+------------------+-----+----------+--------+ +---------+------------------+-----+----------+-------+ Left Lt Pressure (mmHg)IndexWaveform Comment +---------+------------------+-----+----------+-------+ Brachial 169     +---------+------------------+-----+----------+-------+ PTA >254 1.50 monophasic  +---------+------------------+-----+----------+-------+ DP >254 1.50 monophasic  +---------+------------------+-----+----------+-------+ Great Toe40 0.24  Abnormal   +---------+------------------+-----+----------+-------+ Pedal arteries appear hyperemic. Patient refused Brachial pressure in  the right arm. Summary: Right: Resting right ankle-brachial index indicates noncompressible right lower extremity arteries. The right toe- brachial index is abnormal. Left: Resting left ankle-brachial index indicates noncompressible left lower extremity arteries. The left toe-brachial index is abnormal. He is also having considerably more swelling in his left calf. This was not there when I last saw him 2 weeks ago. He tells me that some of the home health compression wraps have been slipping down and that may be the issue here however a month I am uncertain 54/6; sees vascular on December 22. Still complaining of a lot of pain. DVT study I did last time was negative for DVT Objective Constitutional Patient is hypertensive.. Pulse regular and within target range for patient.Marland Kitchen Respirations regular, non-labored and within target range.. Temperature is normal and within the target range for the patient.Marland Kitchen Appears in no distress. Vitals Time Taken: 10:08 AM, Height: 67 in, Weight: 190 lbs, BMI: 29.8, Temperature: 98.3 F, Pulse: 67 bpm, Respiratory Rate: 18 breaths/min, Blood Pressure: 170/94 mmHg. Eyes Conjunctivae clear. No discharge.no icterus. Respiratory work of breathing is normal. Cardiovascular Popliteal pulses not palpable. Dorsalis pedis pulses present on the right. But the posterior tibial is not palpable. Psychiatric appears at normal baseline. General Notes: Wound exam; again an improvement in the slough however the wound is still substantial. Integumentary (Hair, Skin) No erythema around the wound. Wound #3 status is Open. Original cause of wound was Trauma. The wound is located on the Right,Posterior Lower Leg. The wound measures 16cm length x 3.5cm width x 0.4cm depth; 43.982cm^2 area and 17.593cm^3 volume. There is Fat Layer (Subcutaneous Tissue) Exposed exposed. There is no tunneling or undermining noted. There is a large amount of purulent drainage noted. The wound  margin is well defined and not attached to the wound base. There is small (1-33%) pink granulation within the wound bed. There is a large (67-100%) amount of necrotic tissue within the wound bed including Eschar and Adherent Slough. 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 Dressing Change Frequency: Wound #3 Right,Posterior Lower Leg: Change dressing three times week. - 2x by home health on Wednesdays and Fridays, 1x by wound clinic on Mondays Skin Barriers/Peri-Wound Care: Barrier cream Moisturizing lotion Wound Cleansing: Clean wound with Normal Saline. - or normal saline on days that dressing is changed May shower with protection. Primary Wound Dressing: Wound #3 Right,Posterior Lower Leg: Iodoflex Secondary Dressing: Wound #3 Right,Posterior Lower Leg: ABD pad Kerramax - or Zetuvit (or other super absorbent dressing) Edema Control: Kerlix and Coban - Right Lower Extremity Avoid standing for long periods of time Elevate legs to the level of the heart or above for 30 minutes daily and/or when sitting, a frequency of: - throughout the day Exercise regularly Home Health: [facility] - [facility]. I am continuing with the Iodoflex is the primary dressing ABDs/Curlex Coban 2. The wound surface is not commensurate with an alternative product however until I get a look at her his vascular status I am not sure what we can do here. 3. The patient tells me he has renal failure although I have not looked into this. This will make standard contrast  dye difficult Electronic Signature(s) Signed: 08/28/2019 10:01:17 AM By: [name] MD Entered  By: [name] on 08/27/2019 11:14:26 -------------------------------------------------------------------------------- SuperBill Details Patient Name: Date of Service: DAMARE, SERANO 08/27/2019 Medical Record [id number] Patient Account Number: [id number] Date of Birth/Sex: Treating RN: 16-Mar-1946 (73 y.o. 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 E11.622 Type 2 diabetes mellitus with other skin ulcer Facility Procedures CPT4 Code: [id number] Description: 99213 - WOUND CARE VISIT-LEV 3 EST PT Modifier: Quantity: 1 Physician Procedures CPT4 Code Description: [id number] [facility] - WC PHYS LEVEL 3 - EST PT ICD-10 Diagnosis Description L97.212 Non-pressure chronic ulcer of right calf with fat layer E11.622 Type 2 diabetes mellitus with other skin ulcer I87.321 Chronic venous hypertension  (idiopathic) with inflammat extremity Modifier: exposed ion of right low Quantity: 1 er Engineer, maintenance) Signed: 08/27/2019 5:51:44 PM By: [name] RN, BSN Signed: 08/28/2019 10:01:17 AM By: [name] MD Entered By: [name] on 08/27/2019 11:41:42

## 2019-08-28 NOTE — Progress Notes (Signed)
RAINER, MOUNCE ([id number]) Visit Report for 08/27/2019 Arrival Information Details Patient Name: Date of Service: [name], [name] 08/27/2019 9:30 AM Medical Record [id number] Patient Account Number: [id number] Date of Birth/Sex: Treating RN: 07-17-1946 (73 y.o. [name]) Carlene Coria Primary Care Evian Salguero: [name] Other Clinician: Referring Sir Mallis: Treating Mathews Stuhr/Extender:Robson, [name], [name] in Treatment: 3 Visit Information History Since Last Visit All ordered tests and consults were completed: No Patient Arrived: Ambulatory Added or deleted any medications: No Arrival Time: 10:07 Any new allergies or adverse reactions: No Accompanied By: self Had a fall or experienced change in No Transfer Assistance: None activities of daily living that may affect Patient Identification Verified: Yes risk of falls: Secondary Verification Process Yes Signs or symptoms of abuse/neglect since last No Completed: visito Patient Has Alerts: Yes Hospitalized since last visit: No Patient Alerts: non Implantable device outside of the clinic excluding No compressable cellular tissue based products placed in the center since last visit: Has Dressing in Place as Prescribed: Yes Has Compression in Place as Prescribed: Yes Pain Present Now: No Electronic Signature(s) Signed: 08/28/2019 2:52:36 PM By: Carlene Coria RN Entered By: Carlene Coria on 08/27/2019 10:08:10 -------------------------------------------------------------------------------- Clinic Level of Care Assessment Details Patient Name: Date of Service: [name], [name] 08/27/2019 9:30 AM Medical Record [id number] Patient Account Number: [id number] Date of Birth/Sex: Treating RN: 1946/04/06 (73 y.o. [name] Primary Care Demetric Parslow: [name] Other Clinician: Referring Aayla Marrocco: Treating Lemoine Goyne/Extender:Robson, [name], [name] in Treatment:  3 Clinic Level of Care Assessment Items TOOL 4 Quantity Score X - Use when only an EandM is performed on FOLLOW-UP visit 1 0 ASSESSMENTS - Nursing Assessment / Reassessment X - Reassessment of Co-morbidities (includes updates in patient status) 1 10 X - Reassessment of Adherence to Treatment Plan 1 5 ASSESSMENTS - Wound and Skin Assessment / Reassessment X - Simple Wound Assessment / Reassessment - one wound 1 5 []  - Complex Wound Assessment / Reassessment - multiple wounds 0 []  - Dermatologic / Skin Assessment (not related to wound area) 0 ASSESSMENTS - Focused Assessment []  - Circumferential Edema Measurements - multi extremities 0 []  - Nutritional Assessment / Counseling / Intervention 0 X - Lower Extremity Assessment (monofilament, tuning fork, pulses) 1 5 []  - Peripheral Arterial Disease Assessment (using hand held doppler) 0 ASSESSMENTS - Ostomy and/or Continence Assessment and Care []  - Incontinence Assessment and Management 0 []  - Ostomy Care Assessment and Management (repouching, etc.) 0 PROCESS - Coordination of Care X - Simple Patient / Family Education for ongoing care 1 15 []  - Complex (extensive) Patient / Family Education for ongoing care 0 X - Staff obtains Programmer, systems, Records, Test Results / Process Orders 1 10 X - Staff telephones HHA, Nursing Homes / Clarify orders / etc 1 10 []  - Routine Transfer to another Facility (non-emergent condition) 0 []  - Routine Hospital Admission (non-emergent condition) 0 []  - New Admissions / Biomedical engineer / Ordering NPWT, Apligraf, etc. 0 []  - Emergency Hospital Admission (emergent condition) 0 X - Simple Discharge Coordination 1 10 []  - Complex (extensive) Discharge Coordination 0 PROCESS - Special Needs []  - Pediatric / Minor Patient Management 0 []  - Isolation Patient Management 0 []  - Hearing / Language / Visual special needs 0 []  - Assessment of Community assistance (transportation, D/C planning, etc.) 0 []  -  Additional assistance / Altered mentation 0 []  - Support Surface(s) Assessment (bed, cushion, seat, etc.) 0 INTERVENTIONS - Wound Cleansing / Measurement X - Simple Wound Cleansing - one  wound 1 5 []  - Complex Wound Cleansing - multiple wounds 0 X - Wound Imaging (photographs - any number of wounds) 1 5 []  - Wound Tracing (instead of photographs) 0 X - Simple Wound Measurement - one wound 1 5 []  - Complex Wound Measurement - multiple wounds 0 INTERVENTIONS - Wound Dressings []  - Small Wound Dressing one or multiple wounds 0 []  - Medium Wound Dressing one or multiple wounds 0 X - Large Wound Dressing one or multiple wounds 1 20 X - Application of Medications - topical 1 5 []  - Application of Medications - injection 0 INTERVENTIONS - Miscellaneous []  - External ear exam 0 []  - Specimen Collection (cultures, biopsies, blood, body fluids, etc.) 0 []  - Specimen(s) / Culture(s) sent or taken to Lab for analysis 0 []  - Patient Transfer (multiple staff / Civil Service fast streamer / Similar devices) 0 []  - Simple Staple / Suture removal (25 or less) 0 []  - Complex Staple / Suture removal (26 or more) 0 []  - Hypo / Hyperglycemic Management (close monitor of Blood Glucose) 0 []  - Ankle / Brachial Index (ABI) - do not check if billed separately 0 X - Vital Signs 1 5 Has the patient been seen at the hospital within the last three years: Yes Total Score: 115 Level Of Care: New/Established - Level 3 Electronic Signature(s) Signed: 08/27/2019 5:51:44 PM By: [name] RN, BSN Entered By: [name] on 08/27/2019 11:41:32 -------------------------------------------------------------------------------- Encounter Discharge Information Details Patient Name: Date of Service: [name] 08/27/2019 9:30 AM Medical Record [id number] Patient Account Number: [id number] Date of Birth/Sex: Treating RN: 1946/01/12 (73 y.o. [name] Primary Care Azie Mcconahy: [name] Other  Clinician: Referring Clinten Howk: Treating Ashika Apuzzo/Extender:Robson, [name], [name] in Treatment: 3 Encounter Discharge Information Items Discharge Condition: Stable Ambulatory Status: Ambulatory Discharge Destination: Home Transportation: Private Auto Accompanied By: self Schedule Follow-up Appointment: Yes Clinical Summary of Care: Electronic Signature(s) Signed: 08/27/2019 5:20:01 PM By: [name] Entered By: [name] on 08/27/2019 10:56:40 -------------------------------------------------------------------------------- Lower Extremity Assessment Details Patient Name: Date of Service: FENTON, CANDEE 08/27/2019 9:30 AM Medical Record [id number] Patient Account Number: [id number] Date of Birth/Sex: Treating RN: [birth year] (73 y.o. [name]) Carlene Coria Primary Care Terin Cragle: [name] Other Clinician: Referring Jaret Coppedge: Treating Shirl Weir/Extender:Robson, [name], [name] in Treatment: 3 Edema Assessment Assessed: [Left: No] [Right: No] Edema: [Left: Ye] [Right: s] Calf Left: Right: Point of Measurement: 30 cm From Medial Instep cm 41 cm Ankle Left: Right: Point of Measurement: 10 cm From Medial Instep cm 20 cm Electronic Signature(s) Signed: 08/28/2019 2:52:36 PM By: Carlene Coria RN Entered By: Carlene Coria on 08/27/2019 10:09:45 -------------------------------------------------------------------------------- Multi Wound Chart Details Patient Name: Date of Service: TYRONE, PAUTSCH 08/27/2019 9:30 AM Medical Record [id number] Patient Account Number: [id number] Date of Birth/Sex: Treating RN: 16-Jun-1946 (73 y.o. M) Primary Care Karma Hiney: [name] Other Clinician: Referring Annaliesa Blann: Treating Jarrell Armond/Extender:Robson, [name], [name] in Treatment: 3 Vital Signs Height(in): 71 Pulse(bpm): 53 Weight(lbs): 190 Blood Pressure(mmHg): 170/94 Body Mass Index(BMI):  30 Temperature(F): 98.3 Respiratory 18 Rate(breaths/min): Photos: [3:No Photos] [N/A:N/A] Wound Location: [3:Right Lower Leg - Posterior N/A] Wounding Event: [3:Trauma] [N/A:N/A] 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] [N/A:N/A] Comorbid History: [3:Cataracts, Anemia, Arrhythmia, Congestive Heart Failure, Hypertension, Type II Diabetes, Gout, Neuropathy] [N/A:N/A] Date Acquired: [3:06/06/2019] [N/A:N/A] Weeks of Treatment: [3:3] [N/A:N/A] Wound Status: [3:Open] [N/A:N/A] Measurements L x W x D 16x3.5x0.4 [N/A:N/A] (cm) Area (cm) : [3:43.982] [N/A:N/A] Volume (cm) : [3:17.593] [N/A:N/A] % Reduction in Area: [3:-95.10%] [N/A:N/A] %  Reduction in Volume: -160.20% [N/A:N/A] Classification: [3:Partial Thickness] [N/A:N/A] Exudate Amount: [3:Large] [N/A:N/A] Exudate Type: [3:Purulent] [N/A:N/A] Exudate Color: [3:yellow, brown, green] [N/A:N/A] Wound Margin: [3:Well defined, not attached N/A] Granulation Amount: [3:Small (1-33%)] [N/A:N/A] Granulation Quality: [3:Pink] [N/A:N/A] Necrotic Amount: [3:Large (67-100%)] [N/A:N/A] Necrotic Tissue: [3:Eschar, Adherent Slough N/A] Exposed Structures: [3:Fat Layer (Subcutaneous N/A Tissue) Exposed: Yes Fascia: No Tendon: No Muscle: No Joint: No Bone: No None] [N/A:N/A] Treatment Notes Wound #3 (Right, Posterior Lower Leg) 1. Cleanse With Wound Cleanser Soap and water 2. Periwound Care Moisturizing lotion 3. Primary Dressing Applied Iodoflex 4. Secondary Dressing ABD Pad Kerramax/Xtrasorb 6. Support Layer Applied Kerlix/Coban Notes netting. Electronic Signature(s) Signed: 08/28/2019 10:01:17 AM By: [name] MD Entered By: [name] on 08/27/2019 11:08:26 -------------------------------------------------------------------------------- Multi-Disciplinary Care Plan Details Patient Name: Date of Service: LEMUEL, BOODRAM 08/27/2019 9:30 AM Medical Record [id number] Patient Account Number: [id number] Date of  Birth/Sex: Treating RN: 19-Apr-1946 (73 y.o. [name] Primary Care Landen Knoedler: [name] Other Clinician: Referring Leonardo Plaia: Treating Miu Chiong/Extender:Robson, [name], [name] in Treatment: 3 Active Inactive Abuse / Safety / Falls / Self Care Management Nursing Diagnoses: History of Falls Potential for injury related to falls Goals: Patient will remain injury free related to falls Date Initiated: 08/06/2019 Target Resolution Date: 09/07/2019 Goal Status: Active Patient/caregiver will verbalize/demonstrate measures taken to prevent injury and/or falls Date Initiated: 08/06/2019 Target Resolution Date: 09/07/2019 Goal Status: Active Interventions: Assess Activities of Daily Living upon admission and as needed Assess fall risk on admission and as needed Assess: immobility, friction, shearing, incontinence upon admission and as needed Assess impairment of mobility on admission and as needed per policy Assess personal safety and home safety (as indicated) on admission and as needed Assess self care needs on admission and as needed Provide education on fall prevention Provide education on personal and home safety Notes: Nutrition Nursing Diagnoses: Impaired glucose control: actual or potential Potential for alteratiion in Nutrition/Potential for imbalanced nutrition Goals: Patient/caregiver agrees to and verbalizes understanding of need to use nutritional supplements and/or vitamins as prescribed Date Initiated: 08/06/2019 Target Resolution Date: 09/07/2019 Goal Status: Active Patient/caregiver will maintain therapeutic glucose control Date Initiated: 08/06/2019 Target Resolution Date: 09/07/2019 Goal Status: Active Interventions: Assess HgA1c results as ordered upon admission and as needed Assess patient nutrition upon admission and as needed per policy Provide education on elevated blood sugars and impact on wound healing Provide  education on nutrition Treatment Activities: Education provided on Nutrition : 08/06/2019 Notes: Venous Leg Ulcer Nursing Diagnoses: Knowledge deficit related to disease process and management Goals: Patient will maintain optimal edema control Date Initiated: 08/06/2019 Target Resolution Date: 09/07/2019 Goal Status: Active Patient/caregiver will verbalize understanding of disease process and disease management Date Initiated: 08/06/2019 Target Resolution Date: 09/07/2019 Goal Status: Active Interventions: Assess peripheral edema status every visit. Compression as ordered Provide education on venous insufficiency Notes: Wound/Skin Impairment Nursing Diagnoses: Impaired tissue integrity Goals: Patient/caregiver will verbalize understanding of skin care regimen Date Initiated: 08/06/2019 Target Resolution Date: 09/07/2019 Goal Status: Active Ulcer/skin breakdown will have a volume reduction of 30% by week 4 Date Initiated: 08/06/2019 Target Resolution Date: 09/07/2019 Goal Status: Active Interventions: Assess patient/caregiver ability to obtain necessary supplies Assess patient/caregiver ability to perform ulcer/skin care regimen upon admission and as needed Assess ulceration(s) every visit Provide education on ulcer and skin care Notes: Electronic Signature(s) Signed: 08/27/2019 5:51:44 PM By: [name] RN, BSN Entered By: [name] on 08/27/2019 10:37:46 -------------------------------------------------------------------------------- Pain Assessment Details Patient Name: Date of Service: [name] 08/27/2019  9:30 AM Medical Record [id number] Patient Account Number: [id number] Date of Birth/Sex: Treating RN: December 12, 1945 (73 y.o. [name]) Carlene Coria Primary Care Kaylyne Axton: [name] Other Clinician: Referring Angeldejesus Callaham: Treating Lyndall Windt/Extender:Robson, [name], [name] in Treatment: 3 Active Problems Location of Pain  Severity and Description of Pain Patient Has Paino Yes Site Locations With Dressing Change: Yes With Dressing Change: Yes Duration of the Pain. Constant / Intermittento Intermittent Rate the pain. Current Pain Level: 7 Worst Pain Level: 10 Least Pain Level: 2 Tolerable Pain Level: 5 Character of Pain Describe the Pain: Aching, Throbbing Pain Management and Medication Current Pain Management: Medication: Yes Cold Application: No Rest: Yes Massage: No Activity: No T.E.N.S.: No Heat Application: No Leg drop or elevation: No Is the Current Pain Management Adequate: Inadequate How does your wound impact your activities of daily livingo Sleep: Yes Bathing: No Appetite: No Relationship With Others: No Bladder Continence: No Emotions: No Bowel Continence: No Work: No Toileting: No Drive: No Dressing: No Hobbies: No Electronic Signature(s) Signed: 08/28/2019 2:52:36 PM By: Carlene Coria RN Entered By: Carlene Coria on 08/27/2019 10:09:30 -------------------------------------------------------------------------------- Patient/Caregiver Education Details Patient Name: Date of Service: SHANTA, DORVIL 12/7/2020andnbsp9:30 AM Medical Record [id number] Patient Account Number: [id number] Date of Birth/Gender: Treating RN: 21-Sep-1945 (73 y.o. [name] Primary Care Physician: [name] Other Clinician: Referring Physician: Treating Physician/Extender:Robson, [name], [name] in Treatment: 3 Education Assessment Education Provided To: Patient Education Topics Provided Wound/Skin Impairment: Methods: Explain/Verbal Responses: State content correctly Electronic Signature(s) Signed: 08/27/2019 5:51:44 PM By: [name] RN, BSN Entered By: [name] on 08/27/2019 10:38:03 -------------------------------------------------------------------------------- Wound Assessment Details Patient Name: Date of Service: ATLEE, KLUTH 08/27/2019 9:30 AM Medical Record [id number] Patient Account Number: [id number] Date of Birth/Sex: Treating RN: 21-Apr-1946 (73 y.o. [name]) Carlene Coria Primary Care Ralynn San: [name] Other Clinician: Referring Sharisse Rantz: Treating Helmut Hennon/Extender:Robson, [name], [name] in Treatment: 3 Wound Status Wound Number: 3 Primary Venous Leg Ulcer Etiology: Wound Location: Right Lower Leg - Posterior Wound Open Wounding Event: Trauma Status: Date Acquired: 06/06/2019 Comorbid Cataracts, Anemia, Arrhythmia, Congestive Weeks Of Treatment: 3 History: Heart Failure, Hypertension, Type II Diabetes, Clustered Wound: No Gout, Neuropathy Photos Wound Measurements Length: (cm) 16 Width: (cm) 3.5 Depth: (cm) 0.4 Area: (cm) 43.982 Volume: (cm) 17.593 Wound Description Classification: Partial Thickness Wound Margin: Well defined, not attached Exudate Amount: Large Exudate Type: Purulent Exudate Color: yellow, brown, green Wound Bed Granulation Amount: Small (1-33%) Granulation Quality: Pink Necrotic Amount: Large (67-100%) Necrotic Quality: Eschar, Adherent Slough After Cleansing: No brino Yes Exposed Structure osed: No (Subcutaneous Tissue) Exposed: Yes osed: No osed: No sed: No ed: No % Reduction in Area: -95.1% % Reduction in Volume: -160.2% Epithelialization: None Tunneling: No Undermining: No Foul Odor Slough/Fi Fascia Exp Fat Layer Tendon Exp Muscle Exp Joint Expo Bone Expos Treatment Notes Wound #3 (Right, Posterior Lower Leg) 1. Cleanse With Wound Cleanser Soap and water 2. Periwound Care Moisturizing lotion 3. Primary Dressing Applied Iodoflex 4. Secondary Dressing ABD Pad Kerramax/Xtrasorb 6. Support Layer Applied Kerlix/Coban Notes netting. Electronic Signature(s) Signed: 08/28/2019 2:52:36 PM By: Carlene Coria RN Signed: 08/28/2019 4:34:42 PM By: Mikeal Hawthorne EMT/HBOT Entered By: Mikeal Hawthorne on  08/28/2019 14:12:46 -------------------------------------------------------------------------------- Vitals Details Patient Name: Date of Service: HUNTINGTON, LEVERICH 08/27/2019 9:30 AM Medical Record [id number] Patient Account Number: [id number] Date of Birth/Sex: Treating RN: 1945-12-23 (73 y.o. Oval Linsey Primary Care Kalayla Shadden: [name] Other Clinician: Referring Samanyu Tinnell: Treating Brice Potteiger/Extender:Robson, [name], [name] in  Treatment: 3 Vital Signs Time Taken: 10:08 Temperature (F): 98.3 Height (in): 67 Pulse (bpm): 67 Weight (lbs): 190 Respiratory Rate (breaths/min): 18 Body Mass Index (BMI): 29.8 Blood Pressure (mmHg): 170/94 Reference Range: 80 - 120 mg / dl Electronic Signature(s) Signed: 08/28/2019 2:52:36 PM By: Carlene Coria RN Entered By: Carlene Coria on 08/27/2019 10:08:42

## 2019-08-28 NOTE — Progress Notes (Signed)
[name] ([id number]) Visit Report for 08/06/2019 Abuse/Suicide Risk Screen Details Patient Name: Date of Service: [name], [name] 08/06/2019 10:30 AM Medical Record [id number] Patient Account Number: [id number] Date of Birth/Sex: Treating RN: 07-08-1946 (73 y.o. [name]) Carlene Coria Primary Care Jazlyne Gauger: [name] Other Clinician: Referring Stefan Markarian: Treating Adilene Areola/Extender:Robson, [name], [name] in Treatment: 0 Abuse/Suicide Risk Screen Items Answer ABUSE RISK SCREEN: Has anyone close to you tried to hurt or harm you recentlyo No Do you feel uncomfortable with anyone in your familyo No Has anyone forced you do things that you didnt want to doo No Electronic Signature(s) Signed: 08/28/2019 2:50:47 PM By: Carlene Coria RN Entered By: Carlene Coria on 08/06/2019 11:20:55 -------------------------------------------------------------------------------- Activities of Daily Living Details Patient Name: Date of Service: [name] 08/06/2019 10:30 AM Medical Record [id number] Patient Account Number: [id number] Date of Birth/Sex: Treating RN: 02/01/1946 (73 y.o. [name]) Carlene Coria Primary Care Fardeen Steinberger: [name] Other Clinician: Referring Gwendolynn Merkey: Treating Nicky Kras/Extender:Robson, [name], [name] in Treatment: 0 Activities of Daily Living Items Answer Activities of Daily Living (Please select one for each item) Drive Automobile Completely Able Take Medications Completely Able Use Telephone Completely Able Care for Appearance Completely Able Use Toilet Completely Able Bath / Shower Completely Able Dress Self Completely Able Feed Self Completely Able Walk Completely Able Get In / Out Bed Completely Able Housework Completely Able Prepare Meals Completely Able Handle Money Completely Able Shop for Self Completely Able Electronic Signature(s) Signed: 08/28/2019 2:50:47 PM By: Carlene Coria  RN Entered By: Carlene Coria on 08/06/2019 11:21:39 -------------------------------------------------------------------------------- Education Screening Details Patient Name: Date of Service: [name] 08/06/2019 10:30 AM Medical Record [id number] Patient Account Number: [id number] Date of Birth/Sex: Treating RN: 03-03-1946 (73 y.o. [name]) Carlene Coria Primary Care Leslieann Whisman: [name] Other Clinician: Referring Geraline Halberstadt: Treating Lanessa Shill/Extender:Robson, [name], [name] in Treatment: 0 Primary Learner Assessed: Patient Learning Preferences/Education Level/Primary Language Learning Preference: Explanation Highest Education Level: College or Above Preferred Language: English Cognitive Barrier Language Barrier: No Translator Needed: No Memory Deficit: No Emotional Barrier: No Cultural/Religious Beliefs Affecting Medical Care: No Physical Barrier Impaired Vision: No Impaired Hearing: No Decreased Hand dexterity: No Knowledge/Comprehension Knowledge Level: High Comprehension Level: High Ability to understand written High instructions: Ability to understand verbal High instructions: Motivation Anxiety Level: Calm Cooperation: Cooperative Education Importance: Acknowledges Need Interest in Health Problems: Asks Questions Perception: Coherent Willingness to Engage in Self- High Management Activities: Readiness to Engage in Self- High Management Activities: Electronic Signature(s) Signed: 08/28/2019 2:50:47 PM By: Carlene Coria RN Entered By: Carlene Coria on 08/06/2019 11:22:07 -------------------------------------------------------------------------------- Fall Risk Assessment Details Patient Name: Date of Service: [name], [name] 08/06/2019 10:30 AM Medical Record [id number] Patient Account Number: [id number] Date of Birth/Sex: Treating RN: 01-21-1946 (73 y.o. [name]) Carlene Coria Primary Care Shanan Fitzpatrick: [name] Other Clinician: Referring Hedi Barkan: Treating Allyssa Abruzzese/Extender:Robson, [name], [name] in Treatment: 0 Fall Risk Assessment Items Have you had 2 or more falls in the last 12 monthso 0 Yes Have you had any fall that resulted in injury in the last 12 monthso 0 Yes FALLS RISK SCREEN History of falling - immediate or within 3 months 25 Yes Secondary diagnosis (Do you have 2 or more medical diagnoseso) 0 No Ambulatory aid None/bed rest/wheelchair/nurse 0 No Crutches/cane/walker 0 No Furniture 0 No Intravenous therapy Access/Saline/Heparin Lock 0 No Weak (short steps with or without shuffle, stooped but able to lift head 0 No while walking, may seek support from furniture) Impaired (short steps with  shuffle, may have difficulty arising from chair, 0 No head down, impaired balance) Mental Status Oriented to own ability 0 No Overestimates or forgets limitations 0 No Risk Level: Medium Risk Score: 25 Electronic Signature(s) Signed: 08/28/2019 2:50:47 PM By: Carlene Coria RN Entered By: Carlene Coria on 08/06/2019 11:23:30 -------------------------------------------------------------------------------- Foot Assessment Details Patient Name: Date of Service: [name], [name] 08/06/2019 10:30 AM Medical Record [id number] Patient Account Number: [id number] Date of Birth/Sex: Treating RN: 06/29/1946 (73 y.o. [name]) Carlene Coria Primary Care Anysha Frappier: [name] Other Clinician: Referring Treena Cosman: Treating Yuriel Lopezmartinez/Extender:Robson, [name], [name] in Treatment: 0 Foot Assessment Items Site Locations + = Sensation present, - = Sensation absent, C = Callus, U = Ulcer R = Redness, W = Warmth, M = Maceration, PU = Pre-ulcerative lesion F = Fissure, S = Swelling, D = Dryness Assessment Right: Left: Other Deformity: No No Prior Foot Ulcer: No No Prior Amputation: No No Charcot Joint: No No Ambulatory Status: Ambulatory Without  Help Gait: Steady Electronic Signature(s) Signed: 08/28/2019 2:50:47 PM By: Carlene Coria RN Entered By: Carlene Coria on 08/06/2019 11:33:36 -------------------------------------------------------------------------------- Nutrition Risk Screening Details Patient Name: Date of Service: [name], [name] 08/06/2019 10:30 AM Medical Record [id number] Patient Account Number: [id number] Date of Birth/Sex: Treating RN: 1946/02/26 (73 y.o. [name]) Carlene Coria Primary Care Christina Gintz: [name] Other Clinician: Referring Montie Gelardi: Treating Rosene Pilling/Extender:Robson, [name], [name] in Treatment: 0 Height (in): 67 Weight (lbs): 190 Body Mass Index (BMI): 29.8 Nutrition Risk Screening Items Score Screening NUTRITION RISK SCREEN: I have an illness or condition that made me change the kind and/or 0 No amount of food I eat I eat fewer than two meals per day 0 No I eat few fruits and vegetables, or milk products 0 No I have three or more drinks of beer, liquor or wine almost every day 0 No I have tooth or mouth problems that make it hard for me to eat 0 No I don't always have enough money to buy the food I need 0 No I eat alone most of the time 0 No I take three or more different prescribed or over-the-counter drugs a day 1 Yes 0 No Without wanting to, I have lost or gained 10 pounds in the last six months I am not always physically able to shop, cook and/or feed myself 0 No Nutrition Protocols Good Risk Protocol 0 No interventions needed Moderate Risk Protocol High Risk Proctocol Risk Level: Good Risk Score: 1 Electronic Signature(s) Signed: 08/28/2019 2:50:47 PM By: Carlene Coria RN Entered By: Carlene Coria on 08/06/2019 11:24:23

## 2019-09-03 ENCOUNTER — Encounter (HOSPITAL_BASED_OUTPATIENT_CLINIC_OR_DEPARTMENT_OTHER): Payer: Medicare Other | Admitting: Internal Medicine

## 2019-09-03 ENCOUNTER — Other Ambulatory Visit: Payer: Self-pay

## 2019-09-03 DIAGNOSIS — M79661 Pain in right lower leg: Secondary | ICD-10-CM | POA: Diagnosis not present

## 2019-09-03 DIAGNOSIS — S81801A Unspecified open wound, right lower leg, initial encounter: Secondary | ICD-10-CM | POA: Diagnosis not present

## 2019-09-03 DIAGNOSIS — L858 Other specified epidermal thickening: Secondary | ICD-10-CM | POA: Diagnosis not present

## 2019-09-03 DIAGNOSIS — E11622 Type 2 diabetes mellitus with other skin ulcer: Secondary | ICD-10-CM | POA: Diagnosis not present

## 2019-09-03 NOTE — Progress Notes (Signed)
MANCIL, [name] ([id number]) Visit Report for 09/03/2019 Arrival Information Details Patient Name: Date of Service: [name], [name] 09/03/2019 9:45 AM Medical Record [id number] Patient Account Number: [id number] Date of Birth/Sex: Treating RN: 10-04-1945 (73 y.o. [name]) Carlene Coria Primary Care Abdoulie Tierce: [name] Other Clinician: Referring Tres Grzywacz: Treating Rickardo Brinegar/Extender:Robson, [name], [name] in Treatment: 4 Visit Information History Since Last Visit All ordered tests and consults were completed: No Patient Arrived: Ambulatory Added or deleted any medications: No Arrival Time: 09:46 Any new allergies or adverse reactions: No Accompanied By: self Had a fall or experienced change in No Transfer Assistance: None activities of daily living that may affect Patient Identification Verified: Yes risk of falls: Secondary Verification Process Yes Signs or symptoms of abuse/neglect since last No Completed: visito Patient Has Alerts: Yes Hospitalized since last visit: No Patient Alerts: non Implantable device outside of the clinic excluding No compressable cellular tissue based products placed in the center since last visit: Has Dressing in Place as Prescribed: Yes Has Compression in Place as Prescribed: Yes Pain Present Now: Yes Electronic Signature(s) Signed: 09/03/2019 5:55:53 PM By: Carlene Coria RN Entered By: Carlene Coria on 09/03/2019 09:51:10 -------------------------------------------------------------------------------- Clinic Level of Care Assessment Details Patient Name: Date of Service: [name], [name] 09/03/2019 9:45 AM Medical Record [id number] Patient Account Number: [id number] Date of Birth/Sex: Treating RN: [birth year] (73 y.o. [name] Primary Care Jowanda Heeg: [name] Other Clinician: Referring Qasim Diveley: Treating Charnae Lill/Extender:Robson, [name], [name] in Treatment:  4 Clinic Level of Care Assessment Items TOOL 4 Quantity Score X - Use when only an EandM is performed on FOLLOW-UP visit 1 0 ASSESSMENTS - Nursing Assessment / Reassessment X - Reassessment of Co-morbidities (includes updates in patient status) 1 10 X - Reassessment of Adherence to Treatment Plan 1 5 ASSESSMENTS - Wound and Skin Assessment / Reassessment X - Simple Wound Assessment / Reassessment - one wound 1 5 []  - Complex Wound Assessment / Reassessment - multiple wounds 0 []  - Dermatologic / Skin Assessment (not related to wound area) 0 ASSESSMENTS - Focused Assessment []  - Circumferential Edema Measurements - multi extremities 0 []  - Nutritional Assessment / Counseling / Intervention 0 X - Lower Extremity Assessment (monofilament, tuning fork, pulses) 1 5 []  - Peripheral Arterial Disease Assessment (using hand held doppler) 0 ASSESSMENTS - Ostomy and/or Continence Assessment and Care []  - Incontinence Assessment and Management 0 []  - Ostomy Care Assessment and Management (repouching, etc.) 0 PROCESS - Coordination of Care X - Simple Patient / Family Education for ongoing care 1 15 []  - Complex (extensive) Patient / Family Education for ongoing care 0 X - Staff obtains Programmer, systems, Records, Test Results / Process Orders 1 10 X - Staff telephones HHA, Nursing Homes / Clarify orders / etc 1 10 []  - Routine Transfer to another Facility (non-emergent condition) 0 []  - Routine Hospital Admission (non-emergent condition) 0 []  - New Admissions / Biomedical engineer / Ordering NPWT, Apligraf, etc. 0 []  - Emergency Hospital Admission (emergent condition) 0 X - Simple Discharge Coordination 1 10 []  - Complex (extensive) Discharge Coordination 0 PROCESS - Special Needs []  - Pediatric / Minor Patient Management 0 []  - Isolation Patient Management 0 []  - Hearing / Language / Visual special needs 0 []  - Assessment of Community assistance (transportation, D/C planning, etc.) 0 []  -  Additional assistance / Altered mentation 0 []  - Support Surface(s) Assessment (bed, cushion, seat, etc.) 0 INTERVENTIONS - Wound Cleansing / Measurement X - Simple Wound Cleansing - one  wound 1 5 []  - Complex Wound Cleansing - multiple wounds 0 X - Wound Imaging (photographs - any number of wounds) 1 5 []  - Wound Tracing (instead of photographs) 0 X - Simple Wound Measurement - one wound 1 5 []  - Complex Wound Measurement - multiple wounds 0 INTERVENTIONS - Wound Dressings []  - Small Wound Dressing one or multiple wounds 0 []  - Medium Wound Dressing one or multiple wounds 0 X - Large Wound Dressing one or multiple wounds 1 20 X - Application of Medications - topical 1 5 []  - Application of Medications - injection 0 INTERVENTIONS - Miscellaneous []  - External ear exam 0 []  - Specimen Collection (cultures, biopsies, blood, body fluids, etc.) 0 []  - Specimen(s) / Culture(s) sent or taken to Lab for analysis 0 []  - Patient Transfer (multiple staff / Civil Service fast streamer / Similar devices) 0 []  - Simple Staple / Suture removal (25 or less) 0 []  - Complex Staple / Suture removal (26 or more) 0 []  - Hypo / Hyperglycemic Management (close monitor of Blood Glucose) 0 []  - Ankle / Brachial Index (ABI) - do not check if billed separately 0 X - Vital Signs 1 5 Has the patient been seen at the hospital within the last three years: Yes Total Score: 115 Level Of Care: New/Established - Level 3 Electronic Signature(s) Signed: 09/03/2019 6:02:21 PM By: [name] RN, BSN Entered By: [name] on 09/03/2019 10:51:26 -------------------------------------------------------------------------------- Encounter Discharge Information Details Patient Name: Date of Service: [name], [name] 09/03/2019 9:45 AM Medical Record [id number] Patient Account Number: [id number] Date of Birth/Sex: Treating RN: 05/13/1946 (73 y.o. [name] Primary Care Norvin Ohlin: [name] Other  Clinician: Referring Dafna Romo: Treating Talyah Seder/Extender:Robson, [name], [name] in Treatment: 4 Encounter Discharge Information Items Discharge Condition: Stable Ambulatory Status: Ambulatory Discharge Destination: Home Transportation: Private Auto Accompanied By: self Schedule Follow-up Appointment: Yes Clinical Summary of Care: Patient Declined Electronic Signature(s) Signed: 09/03/2019 5:56:42 PM By: Kela Millin Entered By: Kela Millin on 09/03/2019 11:00:32 -------------------------------------------------------------------------------- Lower Extremity Assessment Details Patient Name: Date of Service: [name], [name] 09/03/2019 9:45 AM Medical Record [id number] Patient Account Number: [id number] Date of Birth/Sex: Treating RN: 1946/02/14 (73 y.o. [name]) Carlene Coria Primary Care Orah Sonnen: [name] Other Clinician: Referring Mackey Varricchio: Treating Kimbly Eanes/Extender:Robson, [name], [name] in Treatment: 4 Edema Assessment Assessed: [Left: No] [Right: No] Edema: [Left: Ye] [Right: s] Calf Left: Right: Point of Measurement: 30 cm From Medial Instep cm 42 cm Ankle Left: Right: Point of Measurement: 10 cm From Medial Instep cm 23 cm Electronic Signature(s) Signed: 09/03/2019 5:55:53 PM By: Carlene Coria RN Entered By: Carlene Coria on 09/03/2019 09:59:41 -------------------------------------------------------------------------------- Multi Wound Chart Details Patient Name: Date of Service: [name], [name] 09/03/2019 9:45 AM Medical Record [id number] Patient Account Number: [id number] Date of Birth/Sex: Treating RN: 04/23/1946 (73 y.o. [name] Primary Care Kashlyn Salinas: [name] Other Clinician: Referring Macyn Remmert: Treating Noemi Bellissimo/Extender:Robson, [name], [name] in Treatment: 4 Vital Signs Height(in): 67 Pulse(bpm): 21 Weight(lbs): 190 Blood Pressure(mmHg):  114/53 Body Mass Index(BMI): 30 Temperature(F): 98.5 Respiratory 18 Rate(breaths/min): Photos: [3:No Photos] [N/A:N/A] Wound Location: [3:Right Lower Leg - Posterior N/A] Wounding Event: [3:Trauma] [N/A:N/A] 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] [N/A:N/A] Comorbid History: [3:Cataracts, Anemia, Arrhythmia, Congestive Heart Failure, Hypertension, Type II Diabetes, Gout, Neuropathy] [N/A:N/A] Date Acquired: [3:06/06/2019] [N/A:N/A] Weeks of Treatment: [3:4] [N/A:N/A] Wound Status: [3:Open] [N/A:N/A] Measurements L x W x D 16x4x0.3 [N/A:N/A] (cm) Area (cm) : [3:50.265] [N/A:N/A] Volume (cm) : [3:15.08] [N/A:N/A] % Reduction  in Area: [3:-123.00%] [N/A:N/A] % Reduction in Volume: -123.00% [N/A:N/A] Classification: [3:Partial Thickness] [N/A:N/A] Exudate Amount: [3:Medium] [N/A:N/A] Exudate Type: [3:Serosanguineous] [N/A:N/A] Exudate Color: [3:red, brown] [N/A:N/A] Wound Margin: [3:Well defined, not attached N/A] Granulation Amount: [3:Medium (34-66%)] [N/A:N/A] Granulation Quality: [3:Pink] [N/A:N/A] Necrotic Amount: [3:Medium (34-66%)] [N/A:N/A] Necrotic Tissue: [3:Eschar, Adherent Slough N/A] Exposed Structures: [3:Fat Layer (Subcutaneous N/A Tissue) Exposed: Yes Fascia: No Tendon: No Muscle: No Joint: No Bone: No None] [N/A:N/A] Treatment Notes Wound #3 (Right, Posterior Lower Leg) 1. Cleanse With Wound Cleanser Soap and water 3. Primary Dressing Applied Iodosorb 4. Secondary Dressing ABD Pad Dry Gauze Roll Gauze Kerramax/Xtrasorb 6. Support Layer Applied Kerlix/Coban Notes netting. Electronic Signature(s) Signed: 09/03/2019 5:56:50 PM By: [name] MD Signed: 09/03/2019 6:02:21 PM By: [name] RN, BSN Entered By: [name] on 09/03/2019 11:19:27 -------------------------------------------------------------------------------- Multi-Disciplinary Care Plan Details Patient Name: Date of Service: [name], [name] 09/03/2019 9:45 AM Medical Record  [id number] Patient Account Number: [id number] Date of Birth/Sex: Treating RN: 11-14-1945 (73 y.o. [name] Primary Care Rim Thatch: [name] Other Clinician: Referring Annalis Kaczmarczyk: Treating Avantika Shere/Extender:Robson, [name], [name] in Treatment: 4 Active Inactive Nutrition Nursing Diagnoses: Impaired glucose control: actual or potential Potential for alteratiion in Nutrition/Potential for imbalanced nutrition Goals: Patient/caregiver agrees to and verbalizes understanding of need to use nutritional supplements and/or vitamins as prescribed Target Resolution Date Initiated: 08/06/2019 Date Inactivated: 09/03/2019 Date: 09/07/2019 Goal Status: Met Patient/caregiver will maintain therapeutic glucose control Date Initiated: 08/06/2019 Target Resolution Date: 10/05/2019 Goal Status: Active Interventions: Assess HgA1c results as ordered upon admission and as needed Assess patient nutrition upon admission and as needed per policy Provide education on elevated blood sugars and impact on wound healing Provide education on nutrition Treatment Activities: Education provided on Nutrition : 08/06/2019 Notes: Venous Leg Ulcer Nursing Diagnoses: Knowledge deficit related to disease process and management Goals: Patient will maintain optimal edema control Date Initiated: 08/06/2019 Target Resolution Date: 10/05/2019 Goal Status: Active Patient/caregiver will verbalize understanding of disease process and disease management Target Resolution Date Initiated: 08/06/2019 Date Inactivated: 09/03/2019 Date: 09/07/2019 Goal Status: Met Interventions: Assess peripheral edema status every visit. Compression as ordered Provide education on venous insufficiency Notes: Wound/Skin Impairment Nursing Diagnoses: Impaired tissue integrity Goals: Patient/caregiver will verbalize understanding of skin care regimen Date Initiated: 08/06/2019 Target Resolution  Date: 10/05/2019 Goal Status: Active Ulcer/skin breakdown will have a volume reduction of 30% by week 4 Target Resolution Date Initiated: 08/06/2019 Date Inactivated: 09/03/2019 Date: 09/07/2019 Unmet Reason: PAD, Goal Status: Unmet necrotic surface Interventions: Assess patient/caregiver ability to obtain necessary supplies Assess patient/caregiver ability to perform ulcer/skin care regimen upon admission and as needed Assess ulceration(s) every visit Provide education on ulcer and skin care Notes: Electronic Signature(s) Signed: 09/03/2019 6:02:21 PM By: [name] RN, BSN Entered By: [name] on 09/03/2019 10:50:46 -------------------------------------------------------------------------------- Pain Assessment Details Patient Name: Date of Service: [name], [name] 09/03/2019 9:45 AM Medical Record [id number] Patient Account Number: [id number] Date of Birth/Sex: Treating RN: 05-May-1946 (73 y.o. Oval Linsey Primary Care Jlynn Ly: [name] Other Clinician: Referring Naythen Heikkila: Treating Donne Baley/Extender:Robson, [name], [name] in Treatment: 4 Active Problems Location of Pain Severity and Description of Pain Patient Has Paino Yes Site Locations With Dressing Change: Yes Duration of the Pain. Constant / Intermittento Constant Rate the pain. Current Pain Level: 7 Worst Pain Level: 8 Least Pain Level: 3 Tolerable Pain Level: 5 Character of Pain Describe the Pain: Shooting, Throbbing Pain Management and Medication Current Pain Management: Medication: Yes Cold Application: No Rest:  Yes Massage: No Activity: No T.E.N.S.: No Heat Application: No Leg drop or elevation: No Is the Current Pain Management Adequate: Inadequate How does your wound impact your activities of daily livingo Sleep: Yes Bathing: No Appetite: Yes Relationship With Others: No Bladder Continence: No Emotions: No Bowel Continence: No Work:  No Toileting: No Drive: No Dressing: No Hobbies: No Electronic Signature(s) Signed: 09/03/2019 5:55:53 PM By: Carlene Coria RN Entered By: Carlene Coria on 09/03/2019 09:52:35 -------------------------------------------------------------------------------- Patient/Caregiver Education Details Patient Name: [name] 12/14/2020andnbsp9:45 Date of Service: AM Medical Record [id number] Number: Patient Account Number: [id number] Treating RN: Date of Birth/Gender: 22-Aug-1946 (73 y.o. [name]) Other Clinician: Primary Care Physician: Griffin Dakin Referring Physician: Physician/Extender: [name] in Treatment: 4 Education Assessment Education Provided To: Patient Education Topics Provided Wound/Skin Impairment: Methods: Explain/Verbal Responses: State content correctly Electronic Signature(s) Signed: 09/03/2019 6:02:21 PM By: [name] RN, BSN Entered By: [name] on 09/03/2019 10:50:58 -------------------------------------------------------------------------------- Wound Assessment Details Patient Name: Date of Service: [name], [name] 09/03/2019 9:45 AM Medical Record [id number] Patient Account Number: [id number] Date of Birth/Sex: Treating RN: 25-Oct-1945 (73 y.o. [name]) Carlene Coria Primary Care Juventino Pavone: [name] Other Clinician: Referring Janisse Ghan: Treating Keilani Terrance/Extender:Robson, [name], [name] in Treatment: 4 Wound Status Wound Number: 3 Primary Venous Leg Ulcer Etiology: Wound Location: Right Lower Leg - Posterior Wound Open Wounding Event: Trauma Status: Date Acquired: 06/06/2019 Comorbid Cataracts, Anemia, Arrhythmia, Congestive Weeks Of Treatment: 4 History: Heart Failure, Hypertension, Type II Diabetes, Clustered Wound: No Gout, Neuropathy Wound Measurements Length: (cm) 16 Width: (cm) 4 Depth: (cm) 0.3 Area: (cm) 50.265 Volume: (cm)  15.08 Wound Description Classification: Partial Thickness Wound Margin: Well defined, not attached Exudate Amount: Medium Exudate Type: Serosanguineous Exudate Color: red, brown Wound Bed Granulation Amount: Medium (34-66%) Granulation Quality: Pink Necrotic Amount: Medium (34-66%) Necrotic Quality: Eschar, Adherent Slough After Cleansing: No rino Yes Exposed Structure sed: No Subcutaneous Tissue) Exposed: Yes sed: No sed: No ed: No d: No % Reduction in Area: -123% % Reduction in Volume: -123% Epithelialization: None Tunneling: No Undermining: No Foul Odor Slough/Fib Fascia Expo Fat Layer ( Tendon Expo Muscle Expo Joint Expos Bone Expose Treatment Notes Wound #3 (Right, Posterior Lower Leg) 1. Cleanse With Wound Cleanser Soap and water 3. Primary Dressing Applied Iodosorb 4. Secondary Dressing ABD Pad Dry Gauze Roll Gauze Kerramax/Xtrasorb 6. Support Layer Applied Kerlix/Coban Notes netting. Electronic Signature(s) Signed: 09/03/2019 5:55:53 PM By: Carlene Coria RN Entered By: Carlene Coria on 09/03/2019 10:00:24 -------------------------------------------------------------------------------- Vitals Details Patient Name: Date of Service: [name], [name] 09/03/2019 9:45 AM Medical Record [id number] Patient Account Number: [id number] Date of Birth/Sex: Treating RN: [birth year] (73 y.o. [name]) Carlene Coria Primary Care Amara Justen: [name] Other Clinician: Referring Proctor Carriker: Treating Chrystopher Stangl/Extender:Robson, [name], [name] in Treatment: 4 Vital Signs Time Taken: 09:51 Temperature (F): 98.5 Height (in): 67 Pulse (bpm): 55 Weight (lbs): 190 Respiratory Rate (breaths/min): 18 Body Mass Index (BMI): 29.8 Blood Pressure (mmHg): 114/53 Reference Range: 80 - 120 mg / dl Electronic Signature(s) Signed: 09/03/2019 5:55:53 PM By: Carlene Coria RN Entered By: Carlene Coria on 09/03/2019 09:51:31

## 2019-09-03 NOTE — Progress Notes (Signed)
DAINEL, ARCIDIACONO ([id number]) Visit Report for 09/03/2019 HPI Details Patient Name: Date of Service: [name], [name] 09/03/2019 9:45 AM Medical Record [id number] Patient Account Number: [id number] Date of Birth/Sex: Treating RN: 05/16/1946 (73 y.o. [name] Primary Care Provider: [name] Other Clinician: Referring Provider: Treating Provider/Extender:Glanda Spanbauer, [name], [name] in Treatment: 4 History of Present Illness HPI Description: [name] HPI Description: 73 year old gentleman who was recently seen by his nephrologist [name], and noted to have a wound on his left lower extremity which was lacerated 2 months ago and now has reopened. The patient's left shin has a ulceration with some exudate but no evidence of infection and he was referred to [place] for further care as it was known that the patient has had some peripheral vascular disease in the past. Past medical history significant for chronic kidney disease, atrial fibrillation, diabetes mellitus,status post kidney transplant in 1983 and 2005, a week fistula graft placement, status post previous bowel surgery. 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 10/06/2015 -- x-ray of the left tibia and fibula shows no evidence of osteomyelitis. The patient has also had Doppler studies of his extremity and is awaiting the appointment with the vascular surgeon. We have not yet received these reports. 10/13/2015 -- lower extremity venous duplex reflux evaluation shows reflux in the left common femoral vein, left saphenofemoral junction and the proximal greater saphenous vein extending to the proximal calf. There is also reflux in the left proximal to mid small saphenous vein. Arterial duplex studies done showed the resting ABI was not applicable due to tibial artery medial calcification. 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 The right TBI was 1 which is normal and the left ABI was 1 which is normal. Patient has otherwise been doing fine and has been compliant with his dressings. 10/20/2015 -- He was seen by [name] recently for a vascular opinion on 10/15/2015. His left lower extremity venous insufficiency duplex study revealed GSV reflux,SS vein reflux and deep venous reflux in the common femoral vein. His ABIs were non compressible and his TBI on the right was 1.01 and on the left was 0.80. He was asked to continue with the wound care with compressive therapy followed by EVLA of the left GS vein 3 months. He recommended 20-30 mm thigh-high compression stockings and the need for a three-month trial of this. The patient had an Unna boot applied at the vascular office but he could not tolerate this with a lot of pain and issues with his toes and hence came here on Friday for removal of this and we reapplied a 2 layer compression. 11/10/2015 -- patient still has not purchased his 20-30 mm thigh-high compression stockings as prescribed by [name]. Readmission: 08/08/18 on evaluation today patient presents for readmission concerning a new injury to the left anterior lower extremity. He was previously seen in 2017 here in our clinic. He states that he has done fairly well since that point. Nonetheless he is having at this time some pain but states that he hit this on a table that fell over and actually struck his leg. This appears to have pulled back some of his skin which folded in on itself and is causing some difficulty as far as that is concerned. There does not appear to be any evidence of  infection at this time. No fevers, chills, nausea, or vomiting noted at this time. He's been using dressings on his own currently without complication. 08/15/18 on evaluation today patient actually appears to be  doing somewhat better in regard to his wound of the lower [name] when compared to the first visit last week. I had to do a much more extensive debridement at that time it does appear that I'm gonna have to perform some debridement today but it does not look to be as extensive by any means. Nonetheless fortunately he does not show any signs of infection he does have discomfort at this site. I believe based on what I'm seeing currently he may benefit from Iodoflex to help keep the wound bed clean. Patient tolerated therapy without complication. Upon evaluation today the patient actually appears to be doing excellent in regard to his left lower extremity ulcer. This is much better than the previous two visits where he had a lot of necrotic tissue around the edge of the wound simply due to the fact that again there was a significant skin tear where the edge had been cleared away prior to reattaching and being able to heal appropriately. He seems to be doing much better at this point. 08/28/18 on evaluation today the patient's wound actually does appear to be showing signs of improvement. With that being said though he is improving he would likely note even greater improvement if we were able to sharply debride the wound. Nonetheless this caused him to much discomfort he tells me. 09/04/18 on evaluation today patient actually appears to be showing signs of improvement in regard to his left lower extremity ulcer. He has been tolerating the dressing changes including the wrap although he tells me at this point that the burning does last for a couple of days even with just the Iodoflex. I was afraid that this may been part of the issue that he was having with discomfort. It does seem to be the case. Nonetheless he shows no signs of evidence of infection at this time which is good news. No fevers chills noted ADMISSION to [name] 10/05/2018 This is a patient who was cared for in 2017  and again in the fall of this year at our sister clinic and [facility]. He actually lives in [place] in [place]. We have been dealing with an apparent traumatic area on the left anterior tibial area. This is been present for the last several months. He was supposed to be using Iodoflex Kerlix and Coban however he was hospitalized from 09/05/2018 through 09/11/2018 with delirium secondary to pneumonia. Since then he is only been putting Vaseline gauze on this without compression. He also has a more recent skin tear on the dorsal right hand that may have only happened in the last week. The patient had arterial studies done in 2017 in January which was 3 years ago. At that point he had noncompressible ABIs but really quite good TBI's both normal. Triphasic waveforms on the right monophasic at the left posterior tibial but triphasic at the left dorsalis pedis. His ABIs in our clinic today were both noncompressible 1/23; the patient has wounds on the right dorsal hand just distal to the wrist and on the left anterior lower extremity. Both of these look very healthy he is using Hydrofera Blue 1/30; left anterior lower extremity wound much smaller. Healthy looking surface. The laceration area just distal to the wrist on the dorsal hand on the right is also just  about closed I used Hydrofera Blue here 2/6; left anterior lower extremity wound is much smaller but still open. The laceration area just distal to the wrist on the dorsal hand is fully epithelialized. 2/13; the patient's anterior lower extremity wound is closed. The laceration just distal to the wrist on the dorsal hand is also fully epithelialized and closed. The patient has external compression stockings which I think are 20/30 READMISSION 08/06/2019 Mr. Gohman is a 73 year old man with had several times previously in our clinic. He is a diabetic with a history of chronic renal insufficiency status post kidney transplant in 1983 and again  in 2005. He was then in 2017 with a laceration on the left lower extremity. He was worked up at the time with arterial studies and reflux studies. The arterial studies showed ABIs to be noncompressible but TBI's were within normal limits. I do not have the reflux studies at the moment. He was also sent here in 2019 with a left lower extremity wound and then again in 2020 with left lower extremity trauma a skin tear on the wrist. He was discharged with 20/30 stockings identified from myself that that might not be enough compression. Nevertheless he states he was wearing these fairly reliably. In September he had a fall with a substantial bruise in the area of the wound. He says he saw orthopedics and they told him there was some muscle strain sometime it later this opened into a wound. He has a fairly substantial wound on the right posterior calf. Satellite areas around this including medially and posteriorly. He has not worn his stockings since the injury Past medical history; includes chronic renal failure secondary to diabetes with kidney transplant x2, atrial fibrillation, heart failure with preserved ejection fraction, coronary artery disease. ABIs on the right in our clinic were once again noncompressible 08/13/2019 on evaluation today patient appears to be doing decently well with regard to his wound compared to last week's evaluation. Unfortunately he is still having a lot of discomfort at this point which is I think in some part due to the 3 layer compression wrap which is a little bit stronger I think for him. When he was here before we actually utilized a Kerlix and Coban wrap which he states seemed to got a little bit better. Nonetheless I think we can probably drop back to this in light of the discomfort that he had. Nonetheless the pain was not really right around the wound itself as much as it was around the ankle in particular. The Iodoflex does seem to have done well for him As the  wound is appearing somewhat better today which is excellent news. 11/30; still complaining of a lot of pain. Apparently arterial studies I ordered 2 weeks ago are below. I do not believe we have an appointment with vein and vascular as of yet; ABI Findings: +---------+------------------+-----+----------+--------+ Right Rt Pressure (mmHg)IndexWaveform Comment  +---------+------------------+-----+----------+--------+ PTA >254 1.50 monophasic  +---------+------------------+-----+----------+--------+ DP >254 1.50 monophasic  +---------+------------------+-----+----------+--------+ Great Toe68 0.40 Abnormal   +---------+------------------+-----+----------+--------+ +---------+------------------+-----+----------+-------+ Left Lt Pressure (mmHg)IndexWaveform Comment +---------+------------------+-----+----------+-------+ Brachial 169     +---------+------------------+-----+----------+-------+ PTA >254 1.50 monophasic  +---------+------------------+-----+----------+-------+ DP >254 1.50 monophasic  +---------+------------------+-----+----------+-------+ Great Toe40 0.24 Abnormal   +---------+------------------+-----+----------+-------+ Pedal arteries appear hyperemic. Patient refused Brachial pressure in the right arm. Summary: Right: Resting right ankle-brachial index indicates noncompressible right lower extremity arteries. The right toe- brachial index is abnormal. Left: Resting left ankle-brachial index indicates noncompressible left lower extremity arteries. The left toe-brachial index is abnormal.  He is also having considerably more swelling in his left calf. This was not there when I last saw him 2 weeks ago. He tells me that some of the home health compression wraps have been slipping down and that may be the issue here however a month I am uncertain 93/7; sees vascular on December 22. Still complaining of a lot of pain. DVT study  I did last time was negative for DVT 12/14; still complaining of pain which if this is arterial is certainly claudication and rest keeps him uncomfortable at night. He has an appointment with vein and vascular on December 22. Wound surface is better using Iodoflex. Once the surface of this is satisfactory and we have exhausted the vascular route. Perhaps an advanced treatment option. He has a configuration of the venous ulceration although his arterial studies are not very good. The other issue is the patient has a transplanted kidney. This will make angiography difficult and challenging issue Electronic Signature(s) Signed: 09/03/2019 5:56:50 PM By: [name] MD Entered By: [name] on 09/03/2019 11:21:36 -------------------------------------------------------------------------------- Physical Exam Details Patient Name: Date of Service: ERRIC, MACHNIK 09/03/2019 9:45 AM Medical Record [id number] Patient Account Number: [id number] Date of Birth/Sex: Treating RN: 05-23-1946 (73 y.o. [name] Primary Care Provider: [name] Other Clinician: Referring Provider: Treating Provider/Extender:Annamary Buschman, [name], [name] in Treatment: 4 Constitutional Sitting or standing Blood Pressure is within target range for patient.. Pulse regular and within target range for patient.Marland Kitchen Respirations regular, non-labored and within target range.. Temperature is normal and within the target range for the patient.Marland Kitchen Appears in no distress. Eyes Conjunctivae clear. No discharge.no icterus. Respiratory work of breathing is normal. Cardiovascular Posterior tibial pulses palpable lot of swelling in this area.. There is localized swelling around the wound which is on the posterior right calf. Yet there is no overt tenderness.. Musculoskeletal Pain does not seem to be altered by dorsi or plantarflexion of the ankle. Integumentary (Hair, Skin) No erythema  around the wound. Notes Wound exam; improvement in the surface condition of the wound however the surface area I do not think is changed. Electronic Signature(s) Signed: 09/03/2019 5:56:50 PM By: [name] MD Entered By: [name] on 09/03/2019 11:23:44 -------------------------------------------------------------------------------- Physician Orders Details Patient Name: Date of Service: RAND, ETCHISON 09/03/2019 9:45 AM Medical Record [id number] Patient Account Number: [id number] Date of Birth/Sex: Treating RN: 01-13-1946 (73 y.o. [name] Primary Care Provider: [name] Other Clinician: Referring Provider: Treating Provider/Extender:Oswin Griffith, [name], [name] in Treatment: 4 Verbal / Phone Orders: No Diagnosis Coding ICD-10 Coding Code Description S80.11XD Contusion of right lower leg, subsequent encounter L97.212 Non-pressure chronic ulcer of right calf with fat layer exposed I87.321 Chronic venous hypertension (idiopathic) with inflammation of right lower extremity E11.622 Type 2 diabetes mellitus with other skin ulcer Follow-up Appointments Return Appointment in 1 week. Dressing Change Frequency Wound #3 Right,Posterior Lower Leg Change dressing three times week. - 2x by home health on Wednesdays and Fridays, 1x by wound clinic on Mondays Skin Barriers/Peri-Wound Care Barrier cream Moisturizing lotion Wound Cleansing Clean wound with Normal Saline. - or normal saline on days that dressing is changed May shower with protection. Primary Wound Dressing Wound #3 Right,Posterior Lower Leg Iodoflex Secondary Dressing Wound #3 Right,Posterior Lower Leg ABD pad Kerramax - or Zetuvit (or other super absorbent dressing i.e. Lauraine Rinne) Edema Control Kerlix and Coban - Right Lower Extremity Avoid standing for long periods of time Elevate legs to the level of the heart or  above for 30 minutes daily and/or when sitting,  a frequency of: - throughout the day Exercise regularly [facility] for wound care. - [facility] Electronic Signature(s) Signed: 09/03/2019 5:56:50 PM By: [name] MD Signed: 09/03/2019 6:02:21 PM By: [name] RN, BSN Entered By: [name] on 09/03/2019 10:48:25 -------------------------------------------------------------------------------- Problem List Details Patient Name: Date of Service: RYLEY, BACHTEL 09/03/2019 9:45 AM Medical Record [id number] Patient Account Number: [id number] Date of Birth/Sex: Treating RN: April 01, 1946 (73 y.o. 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 layer 08/06/2019 No Yes exposed I87.321 Chronic venous hypertension (idiopathic) with 08/06/2019 No Yes inflammation of right lower extremity E11.622 Type 2 diabetes mellitus with other skin ulcer 08/06/2019 No Yes Inactive Problems Resolved Problems Electronic Signature(s) Signed: 09/03/2019 5:56:50 PM By: [name] MD Entered By: [name] on 09/03/2019 11:19:15 -------------------------------------------------------------------------------- Progress Note Details Patient Name: Date of Service: HELIO, LACK 09/03/2019 9:45 AM Medical Record [id number] Patient Account Number: [id number] Date of Birth/Sex: Treating RN: Aug 09, 1946 (73 y.o. [name] Primary Care Provider: [name] Other Clinician: Referring Provider: Treating Provider/Extender:Robecca Fulgham, [name], [name] in Treatment: 4 Subjective History of Present  Illness (HPI) [place] HPI Description: 73 year old gentleman who was recently seen by his nephrologist [name], and noted to have a wound on his left lower extremity which was lacerated 2 months ago and now has reopened. The patient's left shin has a ulceration with some exudate but no evidence of infection and he was referred to [place] for further care as it was known that the patient has had some peripheral vascular disease in the past. Past medical history significant for chronic kidney disease, atrial fibrillation, diabetes mellitus,status post kidney transplant in 1983 and 2005, a week fistula graft placement, status post previous bowel surgery. 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 10/06/2015 -- x-ray of the left tibia and fibula shows no evidence of osteomyelitis. The patient has also had Doppler studies of his extremity and is awaiting the appointment with the vascular surgeon. We have not yet received these reports. 10/13/2015 -- lower extremity venous duplex reflux evaluation shows reflux in the left common femoral vein, left saphenofemoral junction and the proximal greater saphenous vein extending to the proximal calf. There is also reflux in the left proximal to mid small saphenous vein. Arterial duplex studies done showed the resting ABI was not applicable due to tibial artery medial calcification. 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 The right TBI was 1 which is normal and the left ABI was 1 which is normal. Patient has otherwise been doing fine and has been compliant with his dressings. 10/20/2015 -- He was seen by [name] recently for a vascular opinion on 10/15/2015. His left lower extremity venous insufficiency duplex study revealed GSV reflux,SS vein reflux and deep venous reflux in the common  femoral vein. His ABIs were non compressible and his TBI on the right was 1.01 and on the left was 0.80. He was asked to continue with the wound care with compressive therapy followed by  EVLA of the left GS vein 3 months. He recommended 20-30 mm thigh-high compression stockings and the need for a three-month trial of this. The patient had an Unna boot applied at the vascular office but he could not tolerate this with a lot of pain and issues with his toes and hence came here on Friday for removal of this and we reapplied a 2 layer compression. 11/10/2015 -- patient still has not purchased his 20-30 mm thigh-high compression stockings as prescribed by [name]. Readmission: 08/08/18 on evaluation today patient presents for readmission concerning a new injury to the left anterior lower extremity. He was previously seen in 2017 here in our clinic. He states that he has done fairly well since that point. Nonetheless he is having at this time some pain but states that he hit this on a table that fell over and actually struck his leg. This appears to have pulled back some of his skin which folded in on itself and is causing some difficulty as far as that is concerned. There does not appear to be any evidence of infection at this time. No fevers, chills, nausea, or vomiting noted at this time. He's been using dressings on his own currently without complication. 08/15/18 on evaluation today patient actually appears to be doing somewhat better in regard to his wound of the lower [name] when compared to the first visit last week. I had to do a much more extensive debridement at that time it does appear that I'm gonna have to perform some debridement today but it does not look to be as extensive by any means. Nonetheless fortunately he does not show any signs of infection he does have discomfort at this site. I believe based on what I'm seeing currently he may benefit from Iodoflex to help keep the wound  bed clean. Patient tolerated therapy without complication. Upon evaluation today the patient actually appears to be doing excellent in regard to his left lower extremity ulcer. This is much better than the previous two visits where he had a lot of necrotic tissue around the edge of the wound simply due to the fact that again there was a significant skin tear where the edge had been cleared away prior to reattaching and being able to heal appropriately. He seems to be doing much better at this point. 08/28/18 on evaluation today the patient's wound actually does appear to be showing signs of improvement. With that being said though he is improving he would likely note even greater improvement if we were able to sharply debride the wound. Nonetheless this caused him to much discomfort he tells me. 09/04/18 on evaluation today patient actually appears to be showing signs of improvement in regard to his left lower extremity ulcer. He has been tolerating the dressing changes including the wrap although he tells me at this point that the burning does last for a couple of days even with just the Iodoflex. I was afraid that this may been part of the issue that he was having with discomfort. It does seem to be the case. Nonetheless he shows no signs of evidence of infection at this time which is good news. No fevers chills noted ADMISSION to [name] 10/05/2018 This is a patient who was cared for in 2017 and again in the fall of this year at our sister clinic and [facility]. He actually lives in [place] in [place]. We have been dealing with an apparent traumatic area on the left anterior tibial  area. This is been present for the last several months. He was supposed to be using Iodoflex Kerlix and Coban however he was hospitalized from 09/05/2018 through 09/11/2018 with delirium secondary to pneumonia. Since then he is only been putting Vaseline gauze on this without compression. He  also has a more recent skin tear on the dorsal right hand that may have only happened in the last week. The patient had arterial studies done in 2017 in January which was 3 years ago. At that point he had noncompressible ABIs but really quite good TBI's both normal. Triphasic waveforms on the right monophasic at the left posterior tibial but triphasic at the left dorsalis pedis. His ABIs in our clinic today were both noncompressible 1/23; the patient has wounds on the right dorsal hand just distal to the wrist and on the left anterior lower extremity. Both of these look very healthy he is using Hydrofera Blue 1/30; left anterior lower extremity wound much smaller. Healthy looking surface. The laceration area just distal to the wrist on the dorsal hand on the right is also just about closed I used Hydrofera Blue here 2/6; left anterior lower extremity wound is much smaller but still open. The laceration area just distal to the wrist on the dorsal hand is fully epithelialized. 2/13; the patient's anterior lower extremity wound is closed. The laceration just distal to the wrist on the dorsal hand is also fully epithelialized and closed. The patient has external compression stockings which I think are 20/30 READMISSION 08/06/2019 Mr. Milhorn is a 73 year old man with had several times previously in our clinic. He is a diabetic with a history of chronic renal insufficiency status post kidney transplant in 1983 and again in 2005. He was then in 2017 with a laceration on the left lower extremity. He was worked up at the time with arterial studies and reflux studies. The arterial studies showed ABIs to be noncompressible but TBI's were within normal limits. I do not have the reflux studies at the moment. He was also sent here in 2019 with a left lower extremity wound and then again in 2020 with left lower extremity trauma a skin tear on the wrist. He was discharged with 20/30 stockings identified from  myself that that might not be enough compression. Nevertheless he states he was wearing these fairly reliably. In September he had a fall with a substantial bruise in the area of the wound. He says he saw orthopedics and they told him there was some muscle strain sometime it later this opened into a wound. He has a fairly substantial wound on the right posterior calf. Satellite areas around this including medially and posteriorly. He has not worn his stockings since the injury Past medical history; includes chronic renal failure secondary to diabetes with kidney transplant x2, atrial fibrillation, heart failure with preserved ejection fraction, coronary artery disease. ABIs on the right in our clinic were once again noncompressible 08/13/2019 on evaluation today patient appears to be doing decently well with regard to his wound compared to last week's evaluation. Unfortunately he is still having a lot of discomfort at this point which is I think in some part due to the 3 layer compression wrap which is a little bit stronger I think for him. When he was here before we actually utilized a Kerlix and Coban wrap which he states seemed to got a little bit better. Nonetheless I think we can probably drop back to this in light of the discomfort that he had.  Nonetheless the pain was not really right around the wound itself as much as it was around the ankle in particular. The Iodoflex does seem to have done well for him As the wound is appearing somewhat better today which is excellent news. 11/30; still complaining of a lot of pain. Apparently arterial studies I ordered 2 weeks ago are below. I do not believe we have an appointment with vein and vascular as of yet; ABI Findings: +---------+------------------+-----+----------+--------+ Right Rt Pressure (mmHg)IndexWaveform Comment  +---------+------------------+-----+----------+--------+ PTA >254 1.50 monophasic   +---------+------------------+-----+----------+--------+ DP >254 1.50 monophasic  +---------+------------------+-----+----------+--------+ Great Toe68 0.40 Abnormal   +---------+------------------+-----+----------+--------+ +---------+------------------+-----+----------+-------+ Left Lt Pressure (mmHg)IndexWaveform Comment +---------+------------------+-----+----------+-------+ Brachial 169     +---------+------------------+-----+----------+-------+ PTA >254 1.50 monophasic  +---------+------------------+-----+----------+-------+ DP >254 1.50 monophasic  +---------+------------------+-----+----------+-------+ Great Toe40 0.24 Abnormal   +---------+------------------+-----+----------+-------+ Pedal arteries appear hyperemic. Patient refused Brachial pressure in the right arm. Summary: Right: Resting right ankle-brachial index indicates noncompressible right lower extremity arteries. The right toe- brachial index is abnormal. Left: Resting left ankle-brachial index indicates noncompressible left lower extremity arteries. The left toe-brachial index is abnormal. He is also having considerably more swelling in his left calf. This was not there when I last saw him 2 weeks ago. He tells me that some of the home health compression wraps have been slipping down and that may be the issue here however a month I am uncertain 00/9; sees vascular on December 22. Still complaining of a lot of pain. DVT study I did last time was negative for DVT 12/14; still complaining of pain which if this is arterial is certainly claudication and rest keeps him uncomfortable at night. He has an appointment with vein and vascular on December 22. Wound surface is better using Iodoflex. Once the surface of this is satisfactory and we have exhausted the vascular route. Perhaps an advanced treatment option. He has a configuration of the venous ulceration although his arterial  studies are not very good. The other issue is the patient has a transplanted kidney. This will make angiography difficult and challenging issue Objective Constitutional Sitting or standing Blood Pressure is within target range for patient.. Pulse regular and within target range for patient.Marland Kitchen Respirations regular, non-labored and within target range.. Temperature is normal and within the target range for the patient.Marland Kitchen Appears in no distress. Vitals Time Taken: 9:51 AM, Height: 67 in, Weight: 190 lbs, BMI: 29.8, Temperature: 98.5 F, Pulse: 55 bpm, Respiratory Rate: 18 breaths/min, Blood Pressure: 114/53 mmHg. Eyes Conjunctivae clear. No discharge.no icterus. Respiratory work of breathing is normal. Cardiovascular Posterior tibial pulses palpable lot of swelling in this area.. There is localized swelling around the wound which is on the posterior right calf. Yet there is no overt tenderness.. Musculoskeletal Pain does not seem to be altered by dorsi or plantarflexion of the ankle. General Notes: Wound exam; improvement in the surface condition of the wound however the surface area I do not think is changed. Integumentary (Hair, Skin) No erythema around the wound. Wound #3 status is Open. Original cause of wound was Trauma. The wound is located on the Right,Posterior Lower Leg. The wound measures 16cm length x 4cm width x 0.3cm depth; 50.265cm^2 area and 15.08cm^3 volume. There is Fat Layer (Subcutaneous Tissue) Exposed exposed. There is no tunneling or undermining noted. There is a medium amount of serosanguineous drainage noted. The wound margin is well defined and not attached to the wound base. There is medium (34-66%) pink granulation within the wound bed. There is a medium (34-66%) amount of  necrotic tissue within the wound bed including Eschar and Adherent Slough. 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 Dressing Change Frequency: Wound #3 Right,Posterior Lower Leg: Change dressing three times week. - 2x by home health on Wednesdays and Fridays, 1x by wound clinic on Mondays Skin Barriers/Peri-Wound Care: Barrier cream Moisturizing lotion Wound Cleansing: Clean wound with Normal Saline. - or normal saline on days that dressing is changed May shower with protection. Primary Wound Dressing: Wound #3 Right,Posterior Lower Leg: Iodoflex Secondary Dressing: Wound #3 Right,Posterior Lower Leg: ABD pad Kerramax - or Zetuvit (or other super absorbent dressing i.e. Lauraine Rinne) Edema Control: Kerlix and Coban - Right Lower Extremity Avoid standing for long periods of time Elevate legs to the level of the heart or above for 30 minutes daily and/or when sitting, a frequency of: - throughout the day Exercise regularly Home Health: [facility] for [facility]. - [facility]. Continue with Iodoflex as the primary dressing 2. I am wondering about for angiogram although I am concerned about the transplanted kidney. Electronic Signature(s) Signed: 09/03/2019 5:56:50 PM By: [name] MD Entered By: [name] on 09/03/2019 11:25:06 -------------------------------------------------------------------------------- SuperBill Details Patient Name: Date of Service: RAYMUNDO, ROUT 09/03/2019 Medical Record [id number] Patient Account Number: [id number] Date of Birth/Sex: Treating RN: [birth year] (73 y.o. 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

## 2019-09-10 ENCOUNTER — Other Ambulatory Visit: Payer: Self-pay

## 2019-09-10 ENCOUNTER — Encounter (HOSPITAL_BASED_OUTPATIENT_CLINIC_OR_DEPARTMENT_OTHER): Payer: Medicare Other | Admitting: Internal Medicine

## 2019-09-10 DIAGNOSIS — E11622 Type 2 diabetes mellitus with other skin ulcer: Secondary | ICD-10-CM | POA: Diagnosis not present

## 2019-09-11 ENCOUNTER — Encounter: Payer: Self-pay | Admitting: Vascular Surgery

## 2019-09-11 ENCOUNTER — Ambulatory Visit (INDEPENDENT_AMBULATORY_CARE_PROVIDER_SITE_OTHER): Payer: Medicare Other | Admitting: Vascular Surgery

## 2019-09-11 ENCOUNTER — Other Ambulatory Visit: Payer: Self-pay

## 2019-09-11 ENCOUNTER — Other Ambulatory Visit: Payer: Self-pay | Admitting: *Deleted

## 2019-09-11 VITALS — BP 164/81 | HR 56 | Temp 98.4°F | Resp 20 | Ht 68.0 in | Wt 191.9 lb

## 2019-09-11 DIAGNOSIS — I83019 Varicose veins of right lower extremity with ulcer of unspecified site: Secondary | ICD-10-CM

## 2019-09-11 DIAGNOSIS — L97919 Non-pressure chronic ulcer of unspecified part of right lower leg with unspecified severity: Secondary | ICD-10-CM | POA: Diagnosis not present

## 2019-09-11 NOTE — Progress Notes (Signed)
[name]  Patient name: [name] MRN: [id number] DOB: 05/31/1946 Sex: male  REASON FOR CONSULT: Evaluation of ulceration right posterior calf  HPI: [name] is a 73 y.o. male, who is here today for evaluation.  He is known to our practice from prior hemodialysis access.  He currently has functioning renal transplant and is not on dialysis.  He also underwent a prior left great saphenous vein ablation in 2017 with Dr. Kellie Simmering.  He had an open venous ulcer on his left leg at that time which subsequently healed.  He now has history of trivial trauma and falling backward to his right posterior calf.  He initially noticed what sounds like a blistering of this and subsequently had a large open wound which is being treated at the wound center.  He underwent noninvasive studies from arterial standpoint in November 2020 and I have these for review.  He is here today for determination of adequacy of arterial flow and also for evaluation of possible venous hypertension as a cause of his slow healing.  He has had compression as a component of his treatment at the wound center for approximately 1 month.  Past Medical History:  Diagnosis Date  . Anemia   . Atrial fibrillation ([place])   . Chronic kidney disease   . Diabetes mellitus   . Dysrhythmia   . Hyperlipidemia   . Neuropathy   . Wound of left leg 08/2018    Family History  Problem Relation Age of Onset  . Heart disease Mother   . Heart attack Father   . Stroke Sister     SOCIAL HISTORY: Social History   Socioeconomic History  . Marital status: Married    Spouse name: Not on file  . Number of children: Not on file  . Years of education: Not on file  . Highest education level: Not on file  Occupational History  . Not on file  Tobacco Use  . Smoking status: Never Smoker  . Smokeless tobacco: Never Used  Substance and Sexual Activity  . Alcohol use: No      Alcohol/week: 0.0 standard drinks  . Drug use: No  . Sexual activity: Not on file  Other Topics Concern  . Not on file  Social History Narrative  . Not on file   Social Determinants of Health   Financial Resource Strain:   . Difficulty of Paying Living Expenses: Not on file  Food Insecurity:   . Worried About Charity fundraiser in the Last Year: Not on file  . Ran Out of Food in the Last Year: Not on file  Transportation Needs:   . Lack of Transportation (Medical): Not on file  . Lack of Transportation (Non-Medical): Not on file  Physical Activity:   . Days of Exercise per Week: Not on file  . Minutes of Exercise per Session: Not on file  Stress:   . Feeling of Stress : Not on file  Social Connections:   . Frequency of Communication with Friends and Family: Not on file  . Frequency of Social Gatherings with Friends and Family: Not on file  . Attends Religious Services: Not on file  . Active Member of Clubs or Organizations: Not on file  . Attends Archivist Meetings: Not on file  . Marital Status: Not on file  Intimate Partner Violence:   . Fear of Current or Ex-Partner: Not on file  . Emotionally Abused: Not on file  .  Physically Abused: Not on file  . Sexually Abused: Not on file    Allergies  Allergen Reactions  . Elavil [Amitriptyline Hcl] Other (See Comments)    Unknown felt as if he was tripping    Current Outpatient Medications  Medication Sig Dispense Refill  . acetaminophen (TYLENOL) 500 MG tablet Take 1,000 mg by mouth every 8 (eight) hours as needed for moderate pain.     . Alcohol Swabs (B-D SINGLE USE SWABS REGULAR) PADS Use as directed    . aspirin 81 MG tablet Take 81 mg by mouth daily.      Marland Kitchen atorvastatin (LIPITOR) 10 MG tablet Take 10 mg by mouth daily.    Marland Kitchen BAYER CONTOUR NEXT TEST test strip Use as directed    . BD PEN NEEDLE NANO U/F 32G X 4 MM MISC Use as directed    . calcitRIOL (ROCALTROL) 0.25 MCG capsule Take 0.25 mcg by mouth  daily. daily    . carvedilol (COREG) 6.25 MG tablet TAKE 1 TABLET TWICE A DAY WITH MEALS (PLEASE CALL [phone] TO SET UP YEARLY APPOINTMENT WITH DR Tamala Julian FOR FURTHER REFILLS. THANK YOU) 180 tablet 3  . diphenhydrAMINE (BENADRYL) 25 mg capsule Take 1 capsule (25 mg total) by mouth at bedtime as needed (for sleep/allergies.). 30 capsule 0  . furosemide (LASIX) 40 MG tablet Take 80 mg by mouth daily. Fluid. 30 tablet   . gabapentin (NEURONTIN) 100 MG capsule Take 100 mg by mouth 3 (three) times daily as needed (for nerve pain). Take three times/day as needed    . HUMALOG KWIKPEN 100 UNIT/ML KwikPen Inject 3-5 Units into the skin 3 (three) times daily.  6  . hydrALAZINE (APRESOLINE) 25 MG tablet Take 25 mg by mouth 3 (three) times daily.   4  . LANTUS SOLOSTAR 100 UNIT/ML Solostar Pen Inject 7-15 Units into the skin daily. If blood sugars are >300, use 15 units, <300 use 7 units    . latanoprost (XALATAN) 0.005 % ophthalmic solution Place 1 drop into both eyes at bedtime.    Marland Kitchen linagliptin (TRADJENTA) 5 MG TABS tablet Take 5 mg by mouth daily.      Marland Kitchen MICROLET LANCETS MISC Use as directed    . mycophenolate (CELLCEPT) 500 MG tablet Take 500 mg by mouth 2 (two) times daily.     . predniSONE (DELTASONE) 5 MG tablet Take 5 mg by mouth daily.  2  . tacrolimus (PROGRAF) 1 MG capsule Take 1 mg by mouth 2 (two) times daily.     Marland Kitchen VASCEPA 1 g CAPS Take 4 g by mouth daily.     No current facility-administered medications for this visit.    REVIEW OF SYSTEMS:  [X]  denotes positive finding, [ ]  denotes negative finding Cardiac  Comments:  Chest pain or chest pressure:    Shortness of breath upon exertion: x   Short of breath when lying flat:    Irregular heart rhythm:        Vascular    Pain in calf, thigh, or hip brought on by ambulation:    Pain in feet at night that wakes you up from your sleep:     Blood clot in your veins:    Leg swelling:         Pulmonary    Oxygen at home:    Productive  cough:     Wheezing:         Neurologic    Sudden weakness in arms or legs:  Sudden numbness in arms or legs:     Sudden onset of difficulty speaking or slurred speech:    Temporary loss of vision in one eye:     Problems with dizziness:         Gastrointestinal    Blood in stool:     Vomited blood:         Genitourinary    Burning when urinating:     Blood in urine:        Psychiatric    Major depression:         Hematologic    Bleeding problems:    Problems with blood clotting too easily:        Skin    Rashes or ulcers: x       Constitutional    Fever or chills:      PHYSICAL EXAM: Vitals:   09/11/19 1400  BP: (!) 164/81  Pulse: (!) 56  Resp: 20  Temp: 98.4 F (36.9 C)  SpO2: 95%  Weight: 191 lb 14.4 oz (87 kg)  Height: 5\' 8"  (1.727 m)    GENERAL: The patient is a well-nourished male, in no acute distress. The vital signs are documented above. CARDIOVASCULAR: Patient does have significant swelling in his right leg versus his left leg.  I am able to easily palpate a right dorsalis pedis pulse PULMONARY: There is good air exchange  ABDOMEN: Soft and non-tender  MUSCULOSKELETAL: There are no major deformities or cyanosis. NEUROLOGIC: No focal weakness or paresthesias are detected. SKIN: Large open ulcer and posterior aspect of right calf PSYCHIATRIC: The patient has a normal affect.  DATA:  Noninvasive arterial studies from November revealed monophasic waveforms at the pedal vessel bilaterally.  He had a calcified vessels making ankle arm index unreliable.  He does not have any venous reflux studies from the right leg.  I did image his right great saphenous vein with SonoSite and this is enlarged and does have reflux and color-flow Doppler  MEDICAL ISSUES: I had a long discussion with the patient regarding these findings.  Fortunately he does not have any evidence of arterial blockage with normal dorsalis pedis pulse.  He does have severe venous  hypertension with chronic skin changes.  I suspect this is mainly related to venous ulcer.  Feel there is a good chance that he would benefit from ablation of his right great saphenous vein.  We will schedule him for formal venous duplex with reflux study in the next several weeks.  We will have him follow-up with either Dr. Scot Dock or fields for discussion of ablation if he is a candidate   Rosetta Posner, MD [facility], [facility], [facility] Tel [phone] Pager [phone]

## 2019-09-11 NOTE — Progress Notes (Signed)
[name], [name] ([id number]) Visit Report for 09/10/2019 HPI Details Patient Name: Date of Service: [name], [name] 09/10/2019 12:30 PM Medical Record [id number] Patient Account Number: [id number] Date of Birth/Sex: Treating RN: 15-Jan-1946 (73 y.o. M) Primary Care Provider: [name] Other Clinician: Referring Provider: Treating Provider/Extender:Robson, [name], [name] in Treatment: 5 History of Present Illness HPI Description: [name] HPI Description: 73 year old gentleman who was recently seen by his nephrologist [name], and noted to have a wound on his left lower extremity which was lacerated 2 months ago and now has reopened. The patient's left shin has a ulceration with some exudate but no evidence of infection and he was referred to [place] for further care as it was known that the patient has had some peripheral vascular disease in the past. Past medical history significant for chronic kidney disease, atrial fibrillation, diabetes mellitus,status post kidney transplant in 1983 and 2005, a week fistula graft placement, status post previous bowel surgery. 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 10/06/2015 -- x-ray of the left tibia and fibula shows no evidence of osteomyelitis. The patient has also had Doppler studies of his extremity and is awaiting the appointment with the vascular surgeon. We have not yet received these reports. 10/13/2015 -- lower extremity venous duplex reflux evaluation shows reflux in the left common femoral vein, left saphenofemoral junction and the proximal greater saphenous vein extending to the proximal calf. There is also reflux in the left proximal to mid small saphenous vein. Arterial duplex studies done showed the resting ABI was not applicable due to tibial artery medial calcification. The left ABI was 0.8 using the Doppler dorsalis pedis indicating mild arterial  occlusive disease at rest with the posterior tibial artery noted to be noncompressible. The right TBI was 1 which is normal and the left ABI was 1 which is normal. Patient has otherwise been doing fine and has been compliant with his dressings. 10/20/2015 -- He was seen by [name] recently for a vascular opinion on 10/15/2015. His left lower extremity venous insufficiency duplex study revealed GSV reflux,SS vein reflux and deep venous reflux in the common femoral vein. His ABIs were non compressible and his TBI on the right was 1.01 and on the left was 0.80. He was asked to continue with the wound care with compressive therapy followed by EVLA of the left GS vein 3 months. He recommended 20-30 mm thigh-high compression stockings and the need for a three-month trial of this. The patient had an Unna boot applied at the vascular office but he could not tolerate this with a lot of pain and issues with his toes and hence came here on Friday for removal of this and we reapplied a 2 layer compression. 11/10/2015 -- patient still has not purchased his 20-30 mm thigh-high compression stockings as prescribed by [name]. Readmission: 08/08/18 on evaluation today patient presents for readmission concerning a new injury to the left anterior lower extremity. He was previously seen in 2017 here in our clinic. He states that he has done fairly well since that point. Nonetheless he is having at this time some pain but states that he hit this on a table that fell over and actually struck his leg. This appears to have pulled back some of his skin which folded in on itself and is causing some difficulty as far as that is concerned. There does not appear to be any evidence of infection at  this time. No fevers, chills, nausea, or vomiting noted at this time. He's been using dressings on his own currently without complication. 08/15/18 on evaluation today patient actually appears to be doing somewhat better in  regard to his wound of the lower [name] when compared to the first visit last week. I had to do a much more extensive debridement at that time it does appear that I'm gonna have to perform some debridement today but it does not look to be as extensive by any means. Nonetheless fortunately he does not show any signs of infection he does have discomfort at this site. I believe based on what I'm seeing currently he may benefit from Iodoflex to help keep the wound bed clean. Patient tolerated therapy without complication. Upon evaluation today the patient actually appears to be doing excellent in regard to his left lower extremity ulcer. This is much better than the previous two visits where he had a lot of necrotic tissue around the edge of the wound simply due to the fact that again there was a significant skin tear where the edge had been cleared away prior to reattaching and being able to heal appropriately. He seems to be doing much better at this point. 08/28/18 on evaluation today the patient's wound actually does appear to be showing signs of improvement. With that being said though he is improving he would likely note even greater improvement if we were able to sharply debride the wound. Nonetheless this caused him to much discomfort he tells me. 09/04/18 on evaluation today patient actually appears to be showing signs of improvement in regard to his left lower extremity ulcer. He has been tolerating the dressing changes including the wrap although he tells me at this point that the burning does last for a couple of days even with just the Iodoflex. I was afraid that this may been part of the issue that he was having with discomfort. It does seem to be the case. Nonetheless he shows no signs of evidence of infection at this time which is good news. No fevers chills noted ADMISSION to [name] 10/05/2018 This is a patient who was cared for in 2017 and again in the fall of  this year at our sister clinic and [place]. He actually lives in [place] in [place]. We have been dealing with an apparent traumatic area on the left anterior tibial area. This is been present for the last several months. He was supposed to be using Iodoflex Kerlix and Coban however he was hospitalized from 09/05/2018 through 09/11/2018 with delirium secondary to pneumonia. Since then he is only been putting Vaseline gauze on this without compression. He also has a more recent skin tear on the dorsal right hand that may have only happened in the last week. The patient had arterial studies done in 2017 in January which was 3 years ago. At that point he had noncompressible ABIs but really quite good TBI's both normal. Triphasic waveforms on the right monophasic at the left posterior tibial but triphasic at the left dorsalis pedis. His ABIs in our clinic today were both noncompressible 1/23; the patient has wounds on the right dorsal hand just distal to the wrist and on the left anterior lower extremity. Both of these look very healthy he is using Hydrofera Blue 1/30; left anterior lower extremity wound much smaller. Healthy looking surface. The laceration area just distal to the wrist on the dorsal hand on the right is also just about closed  I used Hydrofera Blue here 2/6; left anterior lower extremity wound is much smaller but still open. The laceration area just distal to the wrist on the dorsal hand is fully epithelialized. 2/13; the patient's anterior lower extremity wound is closed. The laceration just distal to the wrist on the dorsal hand is also fully epithelialized and closed. The patient has external compression stockings which I think are 20/30 READMISSION 08/06/2019 [name] is a 72 year old man with had several times previously in our clinic. He is a diabetic with a history of chronic renal insufficiency status post kidney transplant in 1983 and again in 2005. He was then in  2017 with a laceration on the left lower extremity. He was worked up at the time with arterial studies and reflux studies. The arterial studies showed ABIs to be noncompressible but TBI's were within normal limits. I do not have the reflux studies at the moment. He was also sent here in 2019 with a left lower extremity wound and then again in 2020 with left lower extremity trauma a skin tear on the wrist. He was discharged with 20/30 stockings identified from myself that that might not be enough compression. Nevertheless he states he was wearing these fairly reliably. In September he had a fall with a substantial bruise in the area of the wound. He says he saw orthopedics and they told him there was some muscle strain sometime it later this opened into a wound. He has a fairly substantial wound on the right posterior calf. Satellite areas around this including medially and posteriorly. He has not worn his stockings since the injury Past medical history; includes chronic renal failure secondary to diabetes with kidney transplant x2, atrial fibrillation, heart failure with preserved ejection fraction, coronary artery disease. ABIs on the right in our clinic were once again noncompressible 08/13/2019 on evaluation today patient appears to be doing decently well with regard to his wound compared to last week's evaluation. Unfortunately he is still having a lot of discomfort at this point which is I think in some part due to the 3 layer compression wrap which is a little bit stronger I think for him. When he was here before we actually utilized a Kerlix and Coban wrap which he states seemed to got a little bit better. Nonetheless I think we can probably drop back to this in light of the discomfort that he had. Nonetheless the pain was not really right around the wound itself as much as it was around the ankle in particular. The Iodoflex does seem to have done well for him As the wound is appearing  somewhat better today which is excellent news. 11/30; still complaining of a lot of pain. Apparently arterial studies I ordered 2 weeks ago are below. I do not believe we have an appointment with vein and vascular as of yet; ABI Findings: +---------+------------------+-----+----------+--------+ Right Rt Pressure (mmHg)IndexWaveform Comment  +---------+------------------+-----+----------+--------+ PTA >254 1.50 monophasic  +---------+------------------+-----+----------+--------+ DP >254 1.50 monophasic  +---------+------------------+-----+----------+--------+ Great Toe68 0.40 Abnormal   +---------+------------------+-----+----------+--------+ +---------+------------------+-----+----------+-------+ Left Lt Pressure (mmHg)IndexWaveform Comment +---------+------------------+-----+----------+-------+ Brachial 169     +---------+------------------+-----+----------+-------+ PTA >254 1.50 monophasic  +---------+------------------+-----+----------+-------+ DP >254 1.50 monophasic  +---------+------------------+-----+----------+-------+ Great Toe40 0.24 Abnormal   +---------+------------------+-----+----------+-------+ Pedal arteries appear hyperemic. Patient refused Brachial pressure in the right arm. Summary: Right: Resting right ankle-brachial index indicates noncompressible right lower extremity arteries. The right toe- brachial index is abnormal. Left: Resting left ankle-brachial index indicates noncompressible left lower extremity arteries. The left toe-brachial index is abnormal. He is  also having considerably more swelling in his left calf. This was not there when I last saw him 2 weeks ago. He tells me that some of the home health compression wraps have been slipping down and that may be the issue here however a month I am uncertain 27/2; sees vascular on December 22. Still complaining of a lot of pain. DVT study I did last time  was negative for DVT 12/14; still complaining of pain which if this is arterial is certainly claudication and rest keeps him uncomfortable at night. He has an appointment with vein and vascular on December 22. Wound surface is better using Iodoflex. Once the surface of this is satisfactory and we have exhausted the vascular route. Perhaps an advanced treatment option. He has a configuration of the venous ulceration although his arterial studies are not very good. The other issue is the patient has a transplanted kidney. This will make angiography difficult and challenging issue 12/21; still complaining of pain and drainage. We are using Iodoflex on the wound under compression. He sees Dr. Donnetta Hutching tomorrow to evaluate his noninvasive studies noted above. He has a transplanted kidney further complicating the options for angiography. Electronic Signature(s) Signed: 09/11/2019 7:47:18 AM By: [name] MD Entered By: [name] on 09/10/2019 13:17:34 -------------------------------------------------------------------------------- Physical Exam Details Patient Name: Date of Service: [name], [name] 09/10/2019 12:30 PM Medical Record [id number] Patient Account Number: [id number] Date of Birth/Sex: Treating RN: 1946/03/10 (73 y.o. M) Primary Care Provider: [name] Other Clinician: Referring Provider: Treating Provider/Extender:Robson, [name], [name] in Treatment: 5 Constitutional Sitting or standing Blood Pressure is within target range for patient.. Pulse regular and within target range for patient.Marland Kitchen Respirations regular, non-labored and within target range.. Temperature is normal and within the target range for the patient.Marland Kitchen Appears in no distress. Respiratory work of breathing is normal. Cardiovascular Cannot feel his peripheral pulses on the right foot. Amount of edema in the right leg is improved but still present.Marland Kitchen Psychiatric appears  at normal baseline. Notes Wound exam; provement in the condition of the wound superiorly but the inferior quarter still covered in necrotic debris. He tolerates any form of debridement even wash this off with Anasept and gauze very poorly. There is no evidence of surrounding infection. Electronic Signature(s) Signed: 09/11/2019 7:47:18 AM By: [name] MD Entered By: [name] on 09/10/2019 13:18:57 -------------------------------------------------------------------------------- Physician Orders Details Patient Name: Date of Service: [name], [name] 09/10/2019 12:30 PM Medical Record [id number] Patient Account Number: [id number] Date of Birth/Sex: Treating RN: 27-Jun-1946 (73 y.o. [name] Primary Care Provider: [name] Other Clinician: Referring Provider: Treating Provider/Extender:Robson, [name], [name] in Treatment: 5 Verbal / Phone Orders: No Diagnosis Coding ICD-10 Coding Code Description S80.11XD Contusion of right lower leg, subsequent encounter L97.212 Non-pressure chronic ulcer of right calf with fat layer exposed I87.321 Chronic venous hypertension (idiopathic) with inflammation of right lower extremity E11.622 Type 2 diabetes mellitus with other skin ulcer Follow-up Appointments Return Appointment in 1 week. Nurse Visit: - tomorrow 12/22 after vascular appointment for rewrap Dressing Change Frequency Wound #3 Right,Posterior Lower Leg Change dressing three times week. - 2x by home health on Wednesdays and Fridays, 1x by wound clinic on Mondays Skin Barriers/Peri-Wound Care Barrier cream Moisturizing lotion Wound Cleansing Clean wound with Normal Saline. - or normal saline on days that dressing is changed May shower with protection. Primary Wound Dressing Wound #3 Right,Posterior Lower Leg Iodoflex Secondary Dressing Wound #3 Right,Posterior Lower Leg ABD pad Kerramax -  or Zetuvit (or other super  absorbent dressing i.e. Lauraine Rinne) Edema Control Kerlix and Coban - Right Lower Extremity Avoid standing for long periods of time Elevate legs to the level of the heart or above for 30 minutes daily and/or when sitting, a frequency of: - throughout the day Exercise regularly [facility] skilled nursing for wound care. - [facility] Electronic Signature(s) Signed: 09/10/2019 5:05:42 PM By: [name] RN, BSN Signed: 09/11/2019 7:47:18 AM By: [name] MD Entered By: [name] on 09/10/2019 13:13:21 -------------------------------------------------------------------------------- Problem List Details Patient Name: Date of Service: [name], [name] 09/10/2019 12:30 PM Medical Record [id number] Patient Account Number: [id number] Date of Birth/Sex: Treating RN: October 05, 1945 (73 y.o. 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 layer 08/06/2019 No Yes exposed I87.321 Chronic venous hypertension (idiopathic) with 08/06/2019 No Yes inflammation of right lower extremity E11.622 Type 2 diabetes mellitus with other skin ulcer 08/06/2019 No Yes Inactive Problems Resolved Problems Electronic Signature(s) Signed: 09/11/2019 7:47:18 AM By: [name] MD Entered By: [name] on 09/10/2019 13:16:00 -------------------------------------------------------------------------------- Progress Note Details Patient Name: Date of Service: [name], [name] 09/10/2019 12:30 PM Medical Record [id number] Patient Account Number: [id number] Date of Birth/Sex: Treating RN: [birth year] (73  y.o. M) Primary Care Provider: [name] Other Clinician: Referring Provider: Treating Provider/Extender:Robson, [name], [name] in Treatment: 5 Subjective History of Present Illness (HPI) [place] HPI Description: 73 year old gentleman who was recently seen by his nephrologist [name], and noted to have a wound on his left lower extremity which was lacerated 2 months ago and now has reopened. The patient's left shin has a ulceration with some exudate but no evidence of infection and he was referred to [place] for further care as it was known that the patient has had some peripheral vascular disease in the past. Past medical history significant for chronic kidney disease, atrial fibrillation, diabetes mellitus,status post kidney transplant in 1983 and 2005, a week fistula graft placement, status post previous bowel surgery. 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 10/06/2015 -- x-ray of the left tibia and fibula shows no evidence of osteomyelitis. The patient has also had Doppler studies of his extremity and is awaiting the appointment with the vascular surgeon. We have not yet received these reports. 10/13/2015 -- lower extremity venous duplex reflux evaluation shows reflux in the left common femoral vein, left saphenofemoral junction and the proximal greater saphenous vein extending to the proximal calf. There is also reflux in the left proximal to mid small saphenous vein. Arterial duplex studies done showed the resting ABI was not applicable due to tibial artery medial calcification. 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 The right TBI was 1 which is normal and the left ABI was 1 which is normal. Patient has otherwise been doing fine and has been compliant with his dressings. 10/20/2015  -- He was seen by [name] recently for a vascular opinion on 10/15/2015. His left lower extremity venous insufficiency duplex study revealed GSV reflux,SS vein reflux and deep venous reflux in the common femoral vein. His  ABIs were non compressible and his TBI on the right was 1.01 and on the left was 0.80. He was asked to continue with the wound care with compressive therapy followed by EVLA of the left GS vein 3 months. He recommended 20-30 mm thigh-high compression stockings and the need for a three-month trial of this. The patient had an Unna boot applied at the vascular office but he could not tolerate this with a lot of pain and issues with his toes and hence came here on Friday for removal of this and we reapplied a 2 layer compression. 11/10/2015 -- patient still has not purchased his 20-30 mm thigh-high compression stockings as prescribed by [name]. Readmission: 08/08/18 on evaluation today patient presents for readmission concerning a new injury to the left anterior lower extremity. He was previously seen in 2017 here in our clinic. He states that he has done fairly well since that point. Nonetheless he is having at this time some pain but states that he hit this on a table that fell over and actually struck his leg. This appears to have pulled back some of his skin which folded in on itself and is causing some difficulty as far as that is concerned. There does not appear to be any evidence of infection at this time. No fevers, chills, nausea, or vomiting noted at this time. He's been using dressings on his own currently without complication. 08/15/18 on evaluation today patient actually appears to be doing somewhat better in regard to his wound of the lower [name] when compared to the first visit last week. I had to do a much more extensive debridement at that time it does appear that I'm gonna have to perform some debridement today but it does not look to be as extensive  by any means. Nonetheless fortunately he does not show any signs of infection he does have discomfort at this site. I believe based on what I'm seeing currently he may benefit from Iodoflex to help keep the wound bed clean. Patient tolerated therapy without complication. Upon evaluation today the patient actually appears to be doing excellent in regard to his left lower extremity ulcer. This is much better than the previous two visits where he had a lot of necrotic tissue around the edge of the wound simply due to the fact that again there was a significant skin tear where the edge had been cleared away prior to reattaching and being able to heal appropriately. He seems to be doing much better at this point. 08/28/18 on evaluation today the patient's wound actually does appear to be showing signs of improvement. With that being said though he is improving he would likely note even greater improvement if we were able to sharply debride the wound. Nonetheless this caused him to much discomfort he tells me. 09/04/18 on evaluation today patient actually appears to be showing signs of improvement in regard to his left lower extremity ulcer. He has been tolerating the dressing changes including the wrap although he tells me at this point that the burning does last for a couple of days even with just the Iodoflex. I was afraid that this may been part of the issue that he was having with discomfort. It does seem to be the case. Nonetheless he shows no signs of evidence of infection at this time which is good news. No fevers chills noted ADMISSION to [name] 10/05/2018 This is a patient who was cared for in 2017 and again in  the fall of this year at our sister clinic and [facility]. He actually lives in [place] in [place]. We have been dealing with an apparent traumatic area on the left anterior tibial area. This is been present for the last several months. He was supposed to be using  Iodoflex Kerlix and Coban however he was hospitalized from 09/05/2018 through 09/11/2018 with delirium secondary to pneumonia. Since then he is only been putting Vaseline gauze on this without compression. He also has a more recent skin tear on the dorsal right hand that may have only happened in the last week. The patient had arterial studies done in 2017 in January which was 3 years ago. At that point he had noncompressible ABIs but really quite good TBI's both normal. Triphasic waveforms on the right monophasic at the left posterior tibial but triphasic at the left dorsalis pedis. His ABIs in our clinic today were both noncompressible 1/23; the patient has wounds on the right dorsal hand just distal to the wrist and on the left anterior lower extremity. Both of these look very healthy he is using Hydrofera Blue 1/30; left anterior lower extremity wound much smaller. Healthy looking surface. The laceration area just distal to the wrist on the dorsal hand on the right is also just about closed I used Hydrofera Blue here 2/6; left anterior lower extremity wound is much smaller but still open. The laceration area just distal to the wrist on the dorsal hand is fully epithelialized. 2/13; the patient's anterior lower extremity wound is closed. The laceration just distal to the wrist on the dorsal hand is also fully epithelialized and closed. The patient has external compression stockings which I think are 20/30 READMISSION 08/06/2019 Mr. Culton is a 73 year old man with had several times previously in our clinic. He is a diabetic with a history of chronic renal insufficiency status post kidney transplant in 1983 and again in 2005. He was then in 2017 with a laceration on the left lower extremity. He was worked up at the time with arterial studies and reflux studies. The arterial studies showed ABIs to be noncompressible but TBI's were within normal limits. I do not have the reflux studies at the  moment. He was also sent here in 2019 with a left lower extremity wound and then again in 2020 with left lower extremity trauma a skin tear on the wrist. He was discharged with 20/30 stockings identified from myself that that might not be enough compression. Nevertheless he states he was wearing these fairly reliably. In September he had a fall with a substantial bruise in the area of the wound. He says he saw orthopedics and they told him there was some muscle strain sometime it later this opened into a wound. He has a fairly substantial wound on the right posterior calf. Satellite areas around this including medially and posteriorly. He has not worn his stockings since the injury Past medical history; includes chronic renal failure secondary to diabetes with kidney transplant x2, atrial fibrillation, heart failure with preserved ejection fraction, coronary artery disease. ABIs on the right in our clinic were once again noncompressible 08/13/2019 on evaluation today patient appears to be doing decently well with regard to his wound compared to last week's evaluation. Unfortunately he is still having a lot of discomfort at this point which is I think in some part due to the 3 layer compression wrap which is a little bit stronger I think for him. When he was here before we actually utilized a  Kerlix and Coban wrap which he states seemed to got a little bit better. Nonetheless I think we can probably drop back to this in light of the discomfort that he had. Nonetheless the pain was not really right around the wound itself as much as it was around the ankle in particular. The Iodoflex does seem to have done well for him As the wound is appearing somewhat better today which is excellent news. 11/30; still complaining of a lot of pain. Apparently arterial studies I ordered 2 weeks ago are below. I do not believe we have an appointment with vein and vascular as of yet; ABI  Findings: +---------+------------------+-----+----------+--------+ Right Rt Pressure (mmHg)IndexWaveform Comment  +---------+------------------+-----+----------+--------+ PTA >254 1.50 monophasic  +---------+------------------+-----+----------+--------+ DP >254 1.50 monophasic  +---------+------------------+-----+----------+--------+ Great Toe68 0.40 Abnormal   +---------+------------------+-----+----------+--------+ +---------+------------------+-----+----------+-------+ Left Lt Pressure (mmHg)IndexWaveform Comment +---------+------------------+-----+----------+-------+ Brachial 169     +---------+------------------+-----+----------+-------+ PTA >254 1.50 monophasic  +---------+------------------+-----+----------+-------+ DP >254 1.50 monophasic  +---------+------------------+-----+----------+-------+ Great Toe40 0.24 Abnormal   +---------+------------------+-----+----------+-------+ Pedal arteries appear hyperemic. Patient refused Brachial pressure in the right arm. Summary: Right: Resting right ankle-brachial index indicates noncompressible right lower extremity arteries. The right toe- brachial index is abnormal. Left: Resting left ankle-brachial index indicates noncompressible left lower extremity arteries. The left toe-brachial index is abnormal. He is also having considerably more swelling in his left calf. This was not there when I last saw him 2 weeks ago. He tells me that some of the home health compression wraps have been slipping down and that may be the issue here however a month I am uncertain 16/0; sees vascular on December 22. Still complaining of a lot of pain. DVT study I did last time was negative for DVT 12/14; still complaining of pain which if this is arterial is certainly claudication and rest keeps him uncomfortable at night. He has an appointment with vein and vascular on December 22. Wound surface is  better using Iodoflex. Once the surface of this is satisfactory and we have exhausted the vascular route. Perhaps an advanced treatment option. He has a configuration of the venous ulceration although his arterial studies are not very good. The other issue is the patient has a transplanted kidney. This will make angiography difficult and challenging issue 12/21; still complaining of pain and drainage. We are using Iodoflex on the wound under compression. He sees Dr. Donnetta Hutching tomorrow to evaluate his noninvasive studies noted above. He has a transplanted kidney further complicating the options for angiography. Objective Constitutional Sitting or standing Blood Pressure is within target range for patient.. Pulse regular and within target range for patient.Marland Kitchen Respirations regular, non-labored and within target range.. Temperature is normal and within the target range for the patient.Marland Kitchen Appears in no distress. Vitals Time Taken: 12:40 PM, Height: 67 in, Weight: 190 lbs, BMI: 29.8, Temperature: 98.4 F, Pulse: 62 bpm, Respiratory Rate: 19 breaths/min, Blood Pressure: 139/87 mmHg, Capillary Blood Glucose: 119 mg/dl. General Notes: patient reported CBG of 119 this morning Respiratory work of breathing is normal. Cardiovascular Cannot feel his peripheral pulses on the right foot. Amount of edema in the right leg is improved but still present.Marland Kitchen Psychiatric appears at normal baseline. General Notes: Wound exam; provement in the condition of the wound superiorly but the inferior quarter still covered in necrotic debris. He tolerates any form of debridement even wash this off with Anasept and gauze very poorly. There is no evidence of surrounding infection. Integumentary (Hair, Skin) Wound #3 status is Open. Original cause of wound was Trauma. The wound  is located on the Right,Posterior Lower Leg. The wound measures 15.5cm length x 6cm width x 0.3cm depth; 73.042cm^2 area and 21.913cm^3 volume. There is  Fat Layer (Subcutaneous Tissue) Exposed exposed. There is no tunneling or undermining noted. There is a large amount of purulent drainage noted. Foul odor after cleansing was noted. The wound margin is well defined and not attached to the wound base. There is medium (34-66%) pink granulation within the wound bed. There is a medium (34-66%) amount of necrotic tissue within the wound bed including Eschar and Adherent Slough. 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 Nurse Visit: - tomorrow 12/22 after vascular appointment for rewrap Dressing Change Frequency: Wound #3 Right,Posterior Lower Leg: Change dressing three times week. - 2x by home health on Wednesdays and Fridays, 1x by wound clinic on Mondays Skin Barriers/Peri-Wound Care: Barrier cream Moisturizing lotion Wound Cleansing: Clean wound with Normal Saline. - or normal saline on days that dressing is changed May shower with protection. Primary Wound Dressing: Wound #3 Right,Posterior Lower Leg: Iodoflex Secondary Dressing: Wound #3 Right,Posterior Lower Leg: ABD pad Kerramax - or Zetuvit (or other super absorbent dressing i.e. Lauraine Rinne) Edema Control: Kerlix and Coban - Right Lower Extremity Avoid standing for long periods of time Elevate legs to the level of the heart or above for 30 minutes daily and/or when sitting, a frequency of: - throughout the day Exercise regularly Home Health: [facility] for wound care. - [facility] 1. Continue with the Iodoflex. 2. We gave an appointment after he sees Dr. Donnetta Hutching tomorrow in case his dressing needs to be rechanged 3. He is going to need some debridement in the  most distal part of this wound although I am not sure I can get him through this. Electronic Signature(s) Signed: 09/11/2019 7:47:18 AM By: [name] MD Entered By: [name] on 09/10/2019 13:19:47 -------------------------------------------------------------------------------- SuperBill Details Patient Name: Date of Service: [name], [name] 09/10/2019 Medical Record [id number] Patient Account Number: [id number] Date of Birth/Sex: Treating RN: 1946/04/13 (73 y.o. 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

## 2019-09-12 NOTE — Progress Notes (Signed)
[name], [name] ([id number]) Visit Report for 09/10/2019 Arrival Information Details Patient Name: Date of Service: [name], [name] 09/10/2019 12:30 PM Medical Record [id number] Patient Account Number: [id number] Date of Birth/Sex: Treating RN: [birth year] (73 y.o. [name] Primary Care Alexis Reber: [name] Other Clinician: Referring Bengie Kaucher: Treating Cardin Nitschke/Extender:Robson, [name], [name] in Treatment: 5 Visit Information History Since Last Visit Added or deleted any medications: No Patient Arrived: Ambulatory Any new allergies or adverse reactions: No Arrival Time: 12:41 Had a fall or experienced change in No Accompanied By: self activities of daily living that may affect Transfer Assistance: None risk of falls: Patient Identification Verified: Yes Signs or symptoms of abuse/neglect since last No Secondary Verification Process Yes visito Completed: Hospitalized since last visit: No Patient Has Alerts: Yes Implantable device outside of the clinic excluding No Patient Alerts: non cellular tissue based products placed in the center compressable since last visit: Has Dressing in Place as Prescribed: Yes Has Compression in Place as Prescribed: Yes Pain Present Now: Yes Electronic Signature(s) Signed: 09/12/2019 4:54:21 PM By: Kela Millin Entered By: Kela Millin [name] 09/10/2019 12:41:58 -------------------------------------------------------------------------------- Clinic Level of Care Assessment Details Patient Name: Date of Service: [name], [name] 09/10/2019 12:30 PM Medical Record [id number] Patient Account Number: [id number] Date of Birth/Sex: Treating RN: [birth year] (73 y.o. [name] Primary Care [name]: [name] Other Clinician: Referring Erbie Arment: Treating Sharron Petruska/Extender:Robson, [name], [name] in Treatment: 5 Clinic Level of Care Assessment  Items TOOL 4 Quantity Score _0  - Use when only an EandM is performed [name] FOLLOW-UP visit 0 ASSESSMENTS - Nursing Assessment / Reassessment _1  - Reassessment of Co-morbidities (includes updates in patient status) 0 _2  - Reassessment of Adherence to Treatment Plan 0 ASSESSMENTS - Wound and Skin Assessment / Reassessment X - Simple Wound Assessment / Reassessment - one wound 1 5 X - Complex Wound Assessment / Reassessment - multiple wounds 1 5 X - Dermatologic / Skin Assessment (not related to wound area) 1 10 ASSESSMENTS - Focused Assessment X - Circumferential Edema Measurements - multi extremities 1 5 _3  - Nutritional Assessment / Counseling / Intervention 0 X - Lower Extremity Assessment (monofilament, tuning fork, pulses) 1 5 _4  - Peripheral Arterial Disease Assessment (using hand held doppler) 0 ASSESSMENTS - Ostomy and/or Continence Assessment and Care _5  - Incontinence Assessment and Management 0 _6  - Ostomy Care Assessment and Management (repouching, etc.) 0 PROCESS - Coordination of Care X - Simple Patient / Family Education for ongoing care 1 15 _7  - Complex (extensive) Patient / Family Education for ongoing care 0 X - Staff obtains Programmer, systems, Records, Test Results / Process Orders 1 10 X - Staff telephones HHA, Nursing Homes / Clarify orders / etc 1 10 _8  - Routine Transfer to another Facility (non-emergent condition) 0 _9  - Routine Hospital Admission (non-emergent condition) 0 _10  - New Admissions / Biomedical engineer / Ordering NPWT, Apligraf, etc. 0 _11  - Emergency Hospital Admission (emergent condition) 0 X - Simple Discharge Coordination 1 10 _12  - Complex (extensive) Discharge Coordination 0 PROCESS - Special Needs _13  - Pediatric / Minor Patient Management 0 _14  - Isolation Patient Management 0 _15  - Hearing / Language / Visual special needs 0 _16  - Assessment of Community assistance (transportation, D/C planning, etc.) 0 _17  - Additional assistance / Altered mentation  0 _18  - Support Surface(s) Assessment (bed, cushion, seat, etc.) 0 INTERVENTIONS - Wound Cleansing / Measurement X - Simple Wound Cleansing - one wound 1 5 _19  - Complex Wound Cleansing -  multiple wounds 0 X - Wound Imaging (photographs - any number of wounds) 1 5 _0  - Wound Tracing (instead of photographs) 0 X - Simple Wound Measurement - one wound 1 5 _1  - Complex Wound Measurement - multiple wounds 0 INTERVENTIONS - Wound Dressings _2  - Small Wound Dressing one or multiple wounds 0 X - Medium Wound Dressing one or multiple wounds 1 15 _3  - Large Wound Dressing one or multiple wounds 0 X - Application of Medications - topical 1 5 <[id number]>_5<\/[id number]>_3  - Application of Medications - injection 0 INTERVENTIONS - Miscellaneous _5  - External ear exam 0 _6  - Specimen Collection (cultures, biopsies, blood, body fluids, etc.) 0 _7  - Specimen(s) / Culture(s) sent or taken to Lab for analysis 0 _8  - Patient Transfer (multiple staff / Civil Service fast streamer / Similar devices) 0 _9  - Simple Staple / Suture removal (25 or less) 0 _10  - Complex Staple / Suture removal (26 or more) 0 _11  - Hypo / Hyperglycemic Management (close monitor of Blood Glucose) 0 _12  - Ankle / Brachial Index (ABI) - do not check if billed separately 0 X - Vital Signs 1 5 Has the patient been seen at the hospital within the last three years: Yes Total Score: 115 Level Of Care: New/Established - Level 3 Electronic Signature(s) Signed: 09/10/2019 5:05:42 PM By: [name] RN, BSN Entered By: [name] [name] 09/10/2019 13:15:04 -------------------------------------------------------------------------------- Encounter Discharge Information Details Patient Name: Date of Service: [name], [name] 09/10/2019 12:30 PM Medical Record [id number] Patient Account Number: [id number] Date of Birth/Sex: Treating RN: [birth year] (73 y.o. [name] Primary Care Kamsiyochukwu Buist: [name] Other Clinician: Referring Miniya Miguez: Treating  Briana Farner/Extender:Robson, [name], [name] in Treatment: 5 Encounter Discharge Information Items Discharge Condition: Stable Ambulatory Status: Ambulatory Discharge Destination: Home Transportation: Private Auto Accompanied By: self Schedule Follow-up Appointment: Yes Clinical Summary of Care: Electronic Signature(s) Signed: 09/10/2019 5:06:44 PM By: [name] Entered By: [name] [name] 09/10/2019 14:54:04 -------------------------------------------------------------------------------- Lower Extremity Assessment Details Patient Name: Date of Service: [name], [name] 09/10/2019 12:30 PM Medical Record [id number] Patient Account Number: [id number] Date of Birth/Sex: Treating RN: Feb 08, 1946 (73 y.o. [name] Primary Care Samin Milke: [name] Other Clinician: Referring Carrera Kiesel: Treating Addylin Manke/Extender:Robson, [name], [name] in Treatment: 5 Edema Assessment Assessed: [Left: No] [Right: No] Edema: [Left: Ye] [Right: s] Calf Left: Right: Point of Measurement: 30 cm From Medial Instep cm 44 cm Ankle Left: Right: Point of Measurement: 10 cm From Medial Instep cm 23 cm Vascular Assessment Pulses: Dorsalis Pedis Palpable: [Right:No] Electronic Signature(s) Signed: 09/12/2019 4:54:21 PM By: Kela Millin Entered By: Kela Millin [name] 09/10/2019 12:49:57 -------------------------------------------------------------------------------- Multi Wound Chart Details Patient Name: Date of Service: [name], [name] 09/10/2019 12:30 PM Medical Record [id number] Patient Account Number: [id number] Date of Birth/Sex: Treating RN: 11/27/1945 (73 y.o. M) Primary Care Katrina Brosh: [name] Other Clinician: Referring Asar Evilsizer: Treating Makinze Jani/Extender:Robson, [name], [name] in Treatment: 5 Vital Signs Height(in): 67 Capillary Blood 119 Glucose(mg/dl): Weight(lbs):  190 Pulse(bpm): 41 Body Mass Index(BMI): 30 Blood Pressure(mmHg): 139/87 Temperature(F): 98.4 Respiratory 19 Rate(breaths/min): Photos: [3:No Photos] [N/A:N/A] Wound Location: [3:Right Lower Leg - Posterior N/A] Wounding Event: [3:Trauma] [N/A:N/A] 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] [N/A:N/A] Comorbid History: [3:Cataracts, Anemia, Arrhythmia, Congestive Heart Failure, Hypertension, Type II Diabetes, Gout, Neuropathy] [N/A:N/A] Date Acquired: [3:06/06/2019] [N/A:N/A] Weeks of Treatment: [3:5] [N/A:N/A] Wound Status: [3:Open] [N/A:N/A] Measurements L x W x D 15.5x6x0.3 [N/A:N/A] (cm) Area (cm) : [3:73.042] [N/A:N/A] Volume (cm) : [3:21.913] [N/A:N/A] % Reduction in Area: [3:-224.00%] [  N/A:N/A] % Reduction in Volume: -224.10% [N/A:N/A] Classification: [3:Partial Thickness] [N/A:N/A] Exudate Amount: [3:Large] [N/A:N/A] Exudate Type: [3:Purulent] [N/A:N/A] Exudate Color: [3:yellow, brown, green] [N/A:N/A] Foul Odor After Cleansing:Yes [N/A:N/A] Odor Anticipated Due to No [N/A:N/A] Product Use: Wound Margin: [3:Well defined, not attached N/A] Granulation Amount: [3:Medium (34-66%)] [N/A:N/A] Granulation Quality: [3:Pink] [N/A:N/A] Necrotic Amount: [3:Medium (34-66%)] [N/A:N/A] Necrotic Tissue: [3:Eschar, Adherent Slough] [N/A:N/A] Exposed Structures: [3:Fat Layer (Subcutaneous Tissue) Exposed: Yes Fascia: No Tendon: No Muscle: No Joint: No Bone: No None] [N/A:N/A N/A] Treatment Notes Electronic Signature(s) Signed: 09/11/2019 7:47:18 AM By: [name] MD Entered By: [name] [name] 09/10/2019 13:16:09 -------------------------------------------------------------------------------- Multi-Disciplinary Care Plan Details Patient Name: Date of Service: [name], [name] 09/10/2019 12:30 PM Medical Record [id number] Patient Account Number: [id number] Date of Birth/Sex: Treating RN: [birth year] (73 y.o. [name] Primary Care Nakira Litzau: [name] Other Clinician: Referring Antonino Nienhuis: Treating Yasha Tibbett/Extender:Robson, [name], [name] in Treatment: 5 Active Inactive Nutrition Nursing Diagnoses: Impaired glucose control: actual or potential Potential for alteratiion in Nutrition/Potential for imbalanced nutrition Goals: Patient/caregiver agrees to and verbalizes understanding of need to use nutritional supplements and/or vitamins as prescribed Target Resolution Date Initiated: 08/06/2019 Date Inactivated: 09/03/2019 Date: 09/07/2019 Goal Status: Met Patient/caregiver will maintain therapeutic glucose control Date Initiated: 08/06/2019 Target Resolution Date: 10/05/2019 Goal Status: Active Interventions: Assess HgA1c results as ordered upon admission and as needed Assess patient nutrition upon admission and as needed per policy Provide education [name] elevated blood sugars and impact [name] wound healing Provide education [name] nutrition Treatment Activities: Education provided [name] Nutrition : 08/06/2019 Notes: Venous Leg Ulcer Nursing Diagnoses: Knowledge deficit related to disease process and management Goals: Patient will maintain optimal edema control Date Initiated: 08/06/2019 Target Resolution Date: 10/05/2019 Goal Status: Active Patient/caregiver will verbalize understanding of disease process and disease management Target Resolution Date Initiated: 08/06/2019 Date Inactivated: 09/03/2019 Date: 09/07/2019 Goal Status: Met Interventions: Assess peripheral edema status every visit. Compression as ordered Provide education [name] venous insufficiency Notes: Wound/Skin Impairment Nursing Diagnoses: Impaired tissue integrity Goals: Patient/caregiver will verbalize understanding of skin care regimen Date Initiated: 08/06/2019 Target Resolution Date: 10/05/2019 Goal Status: Active Ulcer/skin breakdown will have a volume reduction of 30% by week 4 Date Initiated: 08/06/2019 Date Inactivated:  09/03/2019 Target Resolution Date: 09/07/2019 Unmet Reason: PAD, Goal Status: Unmet necrotic surface Interventions: Assess patient/caregiver ability to obtain necessary supplies Assess patient/caregiver ability to perform ulcer/skin care regimen upon admission and as needed Assess ulceration(s) every visit Provide education [name] ulcer and skin care Notes: Electronic Signature(s) Signed: 09/10/2019 5:05:42 PM By: [name] RN, BSN Entered By: [name] [name] 09/10/2019 12:56:34 -------------------------------------------------------------------------------- Pain Assessment Details Patient Name: Date of Service: [name], [name] 09/10/2019 12:30 PM Medical Record [id number] Patient Account Number: [id number] Date of Birth/Sex: Treating RN: 13-Sep-1946 (73 y.o. [name] Primary Care Carmell Elgin: [name] Other Clinician: Referring Kincade Granberg: Treating Jameshia Hayashida/Extender:Robson, [name], [name] in Treatment: 5 Active Problems Location of Pain Severity and Description of Pain Patient Has Paino Yes Site Locations Pain Location: Pain in Ulcers With Dressing Change: Yes Duration of the Pain. Constant / Intermittento Constant Rate the pain. Current Pain Level: 6 Worst Pain Level: 8 Least Pain Level: 5 Tolerable Pain Level: 5 Character of Pain Describe the Pain: Aching, Burning, Stabbing Pain Management and Medication Current Pain Management: Electronic Signature(s) Signed: 09/12/2019 4:54:21 PM By: Kela Millin Entered By: Kela Millin [name] 09/10/2019 12:49:31 -------------------------------------------------------------------------------- Patient/Caregiver Education Details Patient Name: [name] 12/21/2020andnbsp12:30 Date of Service: PM Medical Record [id number] Number: Patient  Account Number: [id number] Treating RN: 07-16-1946 (73 y.o. [name] Date of Birth/Gender: M) Other Clinician: Primary  Care Physician:Coladonato, Terald Sleeper Referring Physician: Physician/Extender: [name] in Treatment: 5 Education Assessment Education Provided To: Patient Education Topics Provided Venous: Methods: Explain/Verbal Responses: Reinforcements needed, State content correctly Electronic Signature(s) Signed: 09/10/2019 5:05:42 PM By: [name] RN, BSN Entered By: [name] [name] 09/10/2019 12:57:17 -------------------------------------------------------------------------------- Wound Assessment Details Patient Name: Date of Service: [name], [name] 09/10/2019 12:30 PM Medical Record [id number] Patient Account Number: [id number] Date of Birth/Sex: Treating RN: 1945-11-17 (73 y.o. [name] Primary Care Roan Miklos: [name] Other Clinician: Referring Joyel Chenette: Treating Tedi Hughson/Extender:Robson, [name], [name] in Treatment: 5 Wound Status Wound Number: 3 Primary Venous Leg Ulcer Etiology: Wound Location: Right Lower Leg - Posterior Wound Open Wounding Event: Trauma Status: Date Acquired: 06/06/2019 Comorbid Cataracts, Anemia, Arrhythmia, Congestive Weeks Of Treatment: 5 History: Heart Failure, Hypertension, Type II Diabetes, Clustered Wound: No Gout, Neuropathy Photos Wound Measurements Length: (cm) 15.5 Width: (cm) 6 Depth: (cm) 0.3 Area: (cm) 73.042 Volume: (cm) 21.913 Wound Description Classification: Partial Thickness Wound Margin: Well defined, not attached Exudate Amount: Large Exudate Type: Purulent Exudate Color: yellow, brown, green Wound Bed Granulation Amount: Medium (34-66%) Granulation Quality: Pink Necrotic Amount: Medium (34-66%) Necrotic Quality: Eschar, Adherent Slough Foul Odor After Cleansing: Yes Due to Product Use: No Slough/Fibrino Yes Exposed Structure Fascia Exposed: No Fat Layer (Subcutaneous Tissue) Exposed: Ye Tendon Exposed: No Muscle  Exposed: No Joint Exposed: No Bone Exposed: No % Reduction in Area: -224% % Reduction in Volume: -224.1% Epithelialization: None Tunneling: No Undermining: No s Treatment Notes Wound #3 (Right, Posterior Lower Leg) 1. Cleanse With Wound Cleanser 2. Periwound Care Barrier cream Moisturizing lotion 3. Primary Dressing Applied Iodoflex 4. Secondary Dressing ABD Pad Dry Gauze Kerramax/Xtrasorb 6. Support Layer Applied 4 layer compression wrap Notes netting. Electronic Signature(s) Signed: 09/12/2019 3:48:24 PM By: Mikeal Hawthorne EMT/HBOT Signed: 09/12/2019 4:54:21 PM By: Kela Millin Entered By: Mikeal Hawthorne [name] 09/12/2019 09:16:43 -------------------------------------------------------------------------------- Vitals Details Patient Name: Date of Service: JAISE, MOSER 09/10/2019 12:30 PM Medical Record [id number] Patient Account Number: [id number] Date of Birth/Sex: Treating RN: 1946-05-12 (73 y.o. [name] Primary Care Geraline Halberstadt: [name] Other Clinician: Referring Jossue Rubenstein: Treating Cailie Bosshart/Extender:Robson, [name], [name] in Treatment: 5 Vital Signs Time Taken: 12:40 Temperature (F): 98.4 Height (in): 67 Pulse (bpm): 62 Weight (lbs): 190 Respiratory Rate (breaths/min): 19 Body Mass Index (BMI): 29.8 Blood Pressure (mmHg): 139/87 Capillary Blood Glucose (mg/dl): 119 Reference Range: 80 - 120 mg / dl Notes patient reported CBG of 119 this morning Electronic Signature(s) Signed: 09/12/2019 4:54:21 PM By: Kela Millin Entered By: Kela Millin [name] 09/10/2019 12:42:47

## 2019-09-17 ENCOUNTER — Encounter (HOSPITAL_BASED_OUTPATIENT_CLINIC_OR_DEPARTMENT_OTHER): Payer: Medicare Other | Admitting: Internal Medicine

## 2019-09-17 ENCOUNTER — Other Ambulatory Visit: Payer: Self-pay

## 2019-09-17 DIAGNOSIS — E11622 Type 2 diabetes mellitus with other skin ulcer: Secondary | ICD-10-CM | POA: Diagnosis not present

## 2019-09-17 NOTE — Progress Notes (Addendum)
TIERNAN, SUTO ([id number]) Visit Report for 09/17/2019 Arrival Information Details Patient Name: Date of Service: [name], [name] 09/17/2019 9:15 AM Medical Record [id number] Patient Account Number: [id number] Date of Birth/Sex: Treating RN: 31-Jan-1946 (73 y.o. [name], Meta.Reding Primary Care : [name] Other Clinician: Referring : Treating /Extender:[name], [name] in Treatment: 6 Visit Information History Since Last Visit Added or deleted any medications: No Patient Arrived: Ambulatory Any new allergies or adverse reactions: No Arrival Time: 09:40 Had a fall or experienced change in No Accompanied By: self activities of daily living that may affect Transfer Assistance: None risk of falls: Patient Has Alerts: Yes Signs or symptoms of abuse/neglect since last No Patient Alerts: non compressable visito Hospitalized since last visit: No Implantable device outside of the clinic excluding No cellular tissue based products placed in the center since last visit: Has Dressing in Place as Prescribed: Yes Has Compression in Place as Prescribed: Yes Pain Present Now: Yes Electronic Signature(s) Signed: 09/17/2019 5:44:52 PM By: [name] Entered By: [name] on 09/17/2019 09:45:07 -------------------------------------------------------------------------------- Encounter Discharge Information Details Patient Name: Date of Service: [name], [name] 09/17/2019 9:15 AM Medical Record [id number] Patient Account Number: [id number] Date of Birth/Sex: Treating RN: [birth year] (73 y.o. [name] Primary Care : [name] Other Clinician: Referring : Treating /Extender:[name], [name] in Treatment: 6 Encounter Discharge Information Items Post Procedure Vitals Discharge Condition: Stable Temperature (F): 98.3 Ambulatory Status:  Ambulatory Pulse (bpm): 52 Discharge Destination: Home Respiratory Rate (breaths/min): 20 Transportation: Private Auto Blood Pressure (mmHg): 156/64 Accompanied By: self Schedule Follow-up Appointment: Yes Clinical Summary of Care: Electronic Signature(s) Signed: 09/17/2019 5:44:52 PM By: [name] Entered By: [name] on 09/17/2019 11:05:27 -------------------------------------------------------------------------------- Lower Extremity Assessment Details Patient Name: Date of Service: [name], [name] 09/17/2019 9:15 AM Medical Record [id number] Patient Account Number: [id number] Date of Birth/Sex: Treating RN: 16-Sep-1946 (73 y.o. [name] Primary Care : [name] Other Clinician: Referring : Treating /Extender:[name], [name] in Treatment: 6 Edema Assessment Assessed: [Left: No] [Right: Yes] Edema: [Left: Ye] [Right: s] Calf Left: Right: Point of Measurement: 30 cm From Medial Instep cm 42 cm Ankle Left: Right: Point of Measurement: 10 cm From Medial Instep cm 24 cm Electronic Signature(s) Signed: 09/17/2019 5:44:52 PM By: [name] Entered By: [name] on 09/17/2019 09:49:19 -------------------------------------------------------------------------------- Multi Wound Chart Details Patient Name: Date of Service: [name], [name] 09/17/2019 9:15 AM Medical Record [id number] Patient Account Number: [id number] Date of Birth/Sex: Treating RN: March 18, 1946 (73 y.o. M) Primary Care : [name] Other Clinician: Referring : Treating /Extender:[name], [name] in Treatment: 6 Vital Signs Height(in): 67 Capillary Blood 120 Glucose(mg/dl): Weight(lbs): 190 Pulse(bpm): 64 Body Mass Index(BMI): 30 Blood Pressure(mmHg): 156/64 Temperature(F): 98.3 Respiratory 20 Rate(breaths/min): Photos: [3:No Photos]  [N/A:N/A] Wound Location: [3:Right Lower Leg - Posterior N/A] Wounding Event: [3:Trauma] [N/A:N/A] 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] [N/A:N/A] Comorbid History: [3:Cataracts, Anemia, Arrhythmia, Congestive Heart Failure, Hypertension, Type II Diabetes, Gout, Neuropathy] [N/A:N/A] Date Acquired: [3:06/06/2019] [N/A:N/A] Weeks of Treatment: [3:6] [N/A:N/A] Wound Status: [3:Open] [N/A:N/A] Measurements L x W x D 15.8x6x0.3 [N/A:N/A] (cm) Area (cm) : [3:74.456] [N/A:N/A] Volume (cm) : [3:22.337] [N/A:N/A] % Reduction in Area: [3:-230.30%] [N/A:N/A] % Reduction in Volume: -230.30% [N/A:N/A] Classification: [3:Partial Thickness] [N/A:N/A] Exudate Amount: [3:Large] [N/A:N/A] Exudate Type: [3:Purulent] [N/A:N/A] Exudate Color: [3:yellow, brown, green] [N/A:N/A] Foul Odor After Cleansing:Yes [N/A:N/A] Odor Anticipated Due to No [N/A:N/A] Product Use: Wound Margin: [3:Well defined, not attached N/A]  Granulation Amount: [3:Medium (34-66%)] [N/A:N/A] Granulation Quality: [3:Pink] [N/A:N/A] Necrotic Amount: [3:Medium (34-66%)] [N/A:N/A] Exposed Structures: [3:Fat Layer (Subcutaneous N/A Tissue) Exposed: Yes Fascia: No Tendon: No Muscle: No Joint: No Bone: No] Epithelialization: [3:None] [N/A:N/A] Debridement: [3:Debridement - Excisional N/A] Pre-procedure [3:10:48] [N/A:N/A] Verification/Time Out Taken: Pain Control: [3:Other] [N/A:N/A] Tissue Debrided: [3:Necrotic/Eschar, Subcutaneous, Slough] [N/A:N/A] Level: [3:Skin/Subcutaneous Tissue N/A] Debridement Area (sq cm):16 [N/A:N/A] Instrument: [3:Curette] [N/A:N/A] Bleeding: [3:Moderate] [N/A:N/A] Hemostasis Achieved: [3:Pressure] [N/A:N/A] Procedural Pain: [3:5] [N/A:N/A] Post Procedural Pain: [3:3] [N/A:N/A] Debridement Treatment [3:Procedure was tolerated] [N/A:N/A] Response: [3:well] Post Debridement [3:15.8x6x0.3] [N/A:N/A] Measurements L x W x D (cm) Post Debridement [3:22.337] [N/A:N/A] Volume: (cm) Procedures  Performed: [3:Debridement] [N/A:N/A] Treatment Notes Wound #3 (Right, Posterior Lower Leg) 1. Cleanse With Wound Cleanser Soap and water 2. Periwound Care Barrier cream Moisturizing lotion 3. Primary Dressing Applied Iodoflex 4. Secondary Dressing ABD Pad Kerramax/Xtrasorb Other secondary dressing (specify in notes) 6. Support Layer Applied Kerlix/Coban Notes secondary dressing applied carboflex. netting. Electronic Signature(s) Signed: 09/17/2019 6:10:19 PM By: [name] MD Entered By: [name] on 09/17/2019 12:10:43 -------------------------------------------------------------------------------- [place] Details Patient Name: Date of Service: [name], [name] 09/17/2019 9:15 AM Medical Record [id number] Patient Account Number: [id number] Date of Birth/Sex: Treating RN: 21-Apr-1946 (73 y.o. [name] Primary Care Gaudencio Chesnut: [name] Other Clinician: Referring Sutter Ahlgren: Treating Darcelle Herrada/Extender:[name], [name] in Treatment: 6 Active Inactive Nutrition Nursing Diagnoses: Impaired glucose control: actual or potential Potential for alteratiion in Nutrition/Potential for imbalanced nutrition Goals: Patient/caregiver agrees to and verbalizes understanding of need to use nutritional supplements and/or vitamins as prescribed Target Resolution Date Initiated: 08/06/2019 Date Inactivated: 09/03/2019 Date: 09/07/2019 Goal Status: Met Patient/caregiver will maintain therapeutic glucose control Date Initiated: 08/06/2019 Target Resolution Date: 10/05/2019 Goal Status: Active Interventions: Assess HgA1c results as ordered upon admission and as needed Assess patient nutrition upon admission and as needed per policy Provide education on elevated blood sugars and impact on wound healing Provide education on nutrition Treatment Activities: Education provided on Nutrition :  08/06/2019 Notes: Venous Leg Ulcer Nursing Diagnoses: Knowledge deficit related to disease process and management Goals: Patient will maintain optimal edema control Date Initiated: 08/06/2019 Target Resolution Date: 10/05/2019 Goal Status: Active Patient/caregiver will verbalize understanding of disease process and disease management Target Resolution Date Initiated: 08/06/2019 Date Inactivated: 09/03/2019 Date: 09/07/2019 Goal Status: Met Interventions: Assess peripheral edema status every visit. Compression as ordered Provide education on venous insufficiency Notes: Wound/Skin Impairment Nursing Diagnoses: Impaired tissue integrity Goals: Patient/caregiver will verbalize understanding of skin care regimen Date Initiated: 08/06/2019 Target Resolution Date: 10/05/2019 Goal Status: Active Ulcer/skin breakdown will have a volume reduction of 30% by week 4 Date Initiated: 08/06/2019 Date Inactivated: 09/03/2019 Target Resolution Date: 09/07/2019 Unmet Reason: PAD, Goal Status: Unmet necrotic surface Interventions: Assess patient/caregiver ability to obtain necessary supplies Assess patient/caregiver ability to perform ulcer/skin care regimen upon admission and as needed Assess ulceration(s) every visit Provide education on ulcer and skin care Notes: Electronic Signature(s) Signed: 09/17/2019 6:10:06 PM By: [name] RN, BSN Entered By: [name] on 09/17/2019 17:42:23 -------------------------------------------------------------------------------- Pain Assessment Details Patient Name: Date of Service: [name], [name] 09/17/2019 9:15 AM Medical Record [id number] Patient Account Number: [id number] Date of Birth/Sex: Treating RN: 1946-04-12 (73 y.o. [name] Primary Care Makinzee Durley: [name] Other Clinician: Referring Dayzee Trower: Treating Nathaniel Wakeley/Extender:[name], [name] in Treatment: 6 Active  Problems Location of Pain Severity and Description of Pain Patient Has Paino Yes Site Locations Pain Location: Generalized Pain, Pain in Ulcers Rate  the pain. Current Pain Level: 6 Worst Pain Level: 9 Least Pain Level: 0 Tolerable Pain Level: 9 Character of Pain Describe the Pain: Aching, Burning, Heavy, Sharp Pain Management and Medication Current Pain Management: Medication: Yes Cold Application: No Rest: Yes Massage: No Activity: No T.E.N.S.: No Heat Application: No Leg drop or elevation: Yes Is the Current Pain Management Adequate: Adequate How does your wound impact your activities of daily livingo Sleep: No Bathing: No Appetite: No Relationship With Others: No Bladder Continence: No Emotions: No Bowel Continence: No Work: No Toileting: No Drive: No Dressing: No Hobbies: No Electronic Signature(s) Signed: 09/17/2019 5:44:52 PM By: [name] Entered By: [name] on 09/17/2019 09:46:31 -------------------------------------------------------------------------------- Patient/Caregiver Education Details Patient Name: [name] 12/28/2020andnbsp9:15 Date of Service: AM Medical Record [id number] Number: Patient Account Number: [id number] Treating RN: 26-Dec-1945 (73 y.o. [name] Date of Birth/Gender: M) Other Clinician: Primary Care Physician: Griffin Dakin Referring Physician: Physician/Extender: [name] in Treatment: 6 Education Assessment Education Provided To: Patient Education Topics Provided Elevated Blood Sugar/ Impact on Healing: Methods: Explain/Verbal Responses: State content correctly Nutrition: Methods: Explain/Verbal Responses: State content correctly Wound/Skin Impairment: Methods: Explain/Verbal Responses: State content correctly Electronic Signature(s) Signed: 09/17/2019 6:10:06 PM By: [name] RN, BSN Entered By: [name] on 09/17/2019  17:42:38 -------------------------------------------------------------------------------- Wound Assessment Details Patient Name: Date of Service: [name], [name] 09/17/2019 9:15 AM Medical Record [id number] Patient Account Number: [id number] Date of Birth/Sex: Treating RN: 1946/01/27 (73 y.o. [name], Meta.Reding Primary Care Talayah Picardi: [name] Other Clinician: Referring Raeya Merritts: Treating Cassundra Mckeever/Extender:[name], [name] in Treatment: 6 Wound Status Wound Number: 3 Primary Venous Leg Ulcer Etiology: Wound Location: Right Lower Leg - Posterior Wound Open Wounding Event: Trauma Status: Date Acquired: 06/06/2019 Comorbid Cataracts, Anemia, Arrhythmia, Congestive Weeks Of Treatment: 6 History: Heart Failure, Hypertension, Type II Diabetes, Clustered Wound: No Gout, Neuropathy Photos Wound Measurements Length: (cm) 15.8 % Reduction in Width: (cm) 6 % Reduction in Depth: (cm) 0.3 Epithelializati Area: (cm) 74.456 Tunneling: Volume: (cm) 22.337 Undermining: Wound Description Classification: Partial Thickness Foul Odor After Wound Margin: Well defined, not attached Due to Product Exudate Amount: Large Slough/Fibrino Exudate Type: Purulent Exudate Color: yellow, brown, green Wound Bed Granulation Amount: Medium (34-66%) Granulation Quality: Pink Fascia Exposed: Necrotic Amount: Medium (34-66%) Fat Layer (Subc Necrotic Quality: Adherent Slough Tendon Exposed: Muscle Exposed: Joint Exposed: Bone Exposed: Cleansing: Yes Use: No Yes Exposed Structure No utaneous Tissue) Exposed: Yes No No No No Area: -230.3% Volume: -230.3% on: None No No Treatment Notes Wound #3 (Right, Posterior Lower Leg) 1. Cleanse With Wound Cleanser Soap and water 2. Periwound Care Barrier cream Moisturizing lotion 3. Primary Dressing Applied Iodoflex 4. Secondary Dressing ABD Pad Kerramax/Xtrasorb Other secondary dressing (specify  in notes) 6. Support Layer Applied Kerlix/Coban Notes secondary dressing applied carboflex. netting. Electronic Signature(s) Signed: 09/18/2019 3:43:24 PM By: Mikeal Hawthorne EMT/HBOT Signed: 09/19/2019 5:32:50 PM By: [name] Previous Signature: 09/17/2019 5:44:52 PM Version By: [name] Entered By: Mikeal Hawthorne on 09/18/2019 12:03:51 -------------------------------------------------------------------------------- Vitals Details Patient Name: Date of Service: [name], [name] 09/17/2019 9:15 AM Medical Record [id number] Patient Account Number: [id number] Date of Birth/Sex: Treating RN: 05-18-1946 (73 y.o. [name], Tammi Klippel Primary Care Ishaaq Penna: [name] Other Clinician: Referring Elizette Shek: Treating Jalisa Sacco/Extender:[name], [name] in Treatment: 6 Vital Signs Time Taken: 09:41 Temperature (F): 98.3 Height (in): 67 Pulse (bpm): 52 Weight (lbs): 190 Respiratory Rate (breaths/min): 20 Body Mass Index (BMI): 29.8 Blood Pressure (mmHg): 156/64  Capillary Blood Glucose (mg/dl): 120 Reference Range: 80 - 120 mg / dl Electronic Signature(s) Signed: 09/17/2019 5:44:52 PM By: [name] Entered By: [name] on 09/17/2019 09:47:42

## 2019-09-17 NOTE — Progress Notes (Signed)
FERRIS, FIELDEN ([id number]) Visit Report for 09/17/2019 Debridement Details Patient Name: Date of Service: [name], [name] 09/17/2019 9:15 AM Medical Record [id number] Patient Account Number: [id number] Date of Birth/Sex: April 14, 1946 (73 y.o. M) Treating RN: Primary Care Provider: Donetta [name] Other Clinician: Referring Provider: Treating Provider/Extender:[name], [name], [name] in Treatment: 6 Debridement Performed for Wound #3 Right,Posterior Lower Leg Assessment: Performed By: Physician [name]., MD Debridement Type: Debridement Severity of Tissue Pre Fat layer exposed Debridement: Level of Consciousness (Pre- Awake and Alert procedure): Pre-procedure Verification/Time Out Taken: Yes - 10:48 Start Time: 10:48 Pain Control: Other : Benzocaine 20% Total Area Debrided (L x W): 4 (cm) x 4 (cm) = 16 (cm) Tissue and other material Viable, Non-Viable, Eschar, Slough, Subcutaneous, Slough debrided: Level: Skin/Subcutaneous Tissue Debridement Description: Excisional Instrument: Curette Bleeding: Moderate Hemostasis Achieved: Pressure End Time: 10:50 Procedural Pain: 5 Post Procedural Pain: 3 Response to Treatment: Procedure was tolerated well Level of Consciousness Awake and Alert (Post-procedure): Post Debridement Measurements of Total Wound Length: (cm) 15.8 Width: (cm) 6 Depth: (cm) 0.3 Volume: (cm) 22.337 Character of Wound/Ulcer Post Requires Further Debridement Debridement: Severity of Tissue Post Debridement: Fat layer exposed Post Procedure Diagnosis Same as Pre-procedure Electronic Signature(s) Signed: 09/17/2019 6:10:19 PM By: Michael Ham MD Entered By: [name] on 09/17/2019 12:11:07 -------------------------------------------------------------------------------- HPI Details Patient Name: Date of Service: [name], [name] 09/17/2019 9:15 AM Medical Record [id number] Patient Account Number:  [id number] Date of Birth/Sex: Treating RN: [birth year] (73 y.o. M) Primary Care Provider: Donetta [name] Other Clinician: Referring Provider: Treating Provider/Extender:[name], [name], [name] in Treatment: 6 History of Present Illness HPI Description: [name] HPI Description: 73 year old gentleman who was recently seen by his nephrologist Dr. Donato [name], and noted to have a wound on his left lower extremity which was lacerated 2 months ago and now has reopened. The patient's left shin has a ulceration with some exudate but no evidence of infection and he was referred to [place] for further care as it was known that the patient has had some peripheral vascular disease in the past. Past medical history significant for chronic kidney disease, atrial fibrillation, diabetes mellitus,status post kidney transplant in 1983 and 2005, a week fistula graft placement, status post previous bowel surgery. 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 10/06/2015 -- x-ray of the left tibia and fibula shows no evidence of osteomyelitis. The patient has also had Doppler studies of his extremity and is awaiting the appointment with the vascular surgeon. We have not yet received these reports. 10/13/2015 -- lower extremity venous duplex reflux evaluation shows reflux in the left common femoral vein, left saphenofemoral junction and the proximal greater saphenous vein extending to the proximal calf. There is also reflux in the left proximal to mid small saphenous vein. Arterial duplex studies done showed the resting ABI was not applicable due to tibial artery medial calcification. 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 The right TBI was 1 which is normal and the left ABI was 1 which is normal. Patient has otherwise been doing  fine and has been compliant with his dressings. 10/20/2015 -- He was seen by [name] recently for a vascular opinion on 10/15/2015. His left lower extremity venous insufficiency duplex study revealed GSV reflux,SS vein reflux and deep venous reflux in the common femoral vein. His ABIs  were non compressible and his TBI on the right was 1.01 and on the left was 0.80. He was asked to continue with the wound care with compressive therapy followed by EVLA of the left GS vein 3 months. He recommended 20-30 mm thigh-high compression stockings and the need for a three-month trial of this. The patient had an Unna boot applied at the vascular office but he could not tolerate this with a lot of pain and issues with his toes and hence came here on Friday for removal of this and we reapplied a 2 layer compression. 11/10/2015 -- patient still has not purchased his 20-30 mm thigh-high compression stockings as prescribed by [name]. Readmission: 08/08/18 on evaluation today patient presents for readmission concerning a new injury to the left anterior lower extremity. He was previously seen in 2017 here in our clinic. He states that he has done fairly well since that point. Nonetheless he is having at this time some pain but states that he hit this on a table that fell over and actually struck his leg. This appears to have pulled back some of his skin which folded in on itself and is causing some difficulty as far as that is concerned. There does not appear to be any evidence of infection at this time. No fevers, chills, nausea, or vomiting noted at this time. He's been using dressings on his own currently without complication. 08/15/18 on evaluation today patient actually appears to be doing somewhat better in regard to his wound of the lower [name] when compared to the first visit last week. I had to do a much more extensive debridement at that time it does appear that I'm gonna have to perform some  debridement today but it does not look to be as extensive by any means. Nonetheless fortunately he does not show any signs of infection he does have discomfort at this site. I believe based on what I'm seeing currently he may benefit from Iodoflex to help keep the wound bed clean. Patient tolerated therapy without complication. Upon evaluation today the patient actually appears to be doing excellent in regard to his left lower extremity ulcer. This is much better than the previous two visits where he had a lot of necrotic tissue around the edge of the wound simply due to the fact that again there was a significant skin tear where the edge had been cleared away prior to reattaching and being able to heal appropriately. He seems to be doing much better at this point. 08/28/18 on evaluation today the patient's wound actually does appear to be showing signs of improvement. With that being said though he is improving he would likely note even greater improvement if we were able to sharply debride the wound. Nonetheless this caused him to much discomfort he tells me. 09/04/18 on evaluation today patient actually appears to be showing signs of improvement in regard to his left lower extremity ulcer. He has been tolerating the dressing changes including the wrap although he tells me at this point that the burning does last for a couple of days even with just the Iodoflex. I was afraid that this may been part of the issue that he was having with discomfort. It does seem to be the case. Nonetheless he shows no signs of evidence of infection at this time which is good news. No fevers chills noted ADMISSION to [name] 10/05/2018 This is a patient who was cared for in 2017 and again in the  fall of this year at our sister clinic and [facility]. He actually lives in [place] in [place]. We have been dealing with an apparent traumatic area on the left anterior tibial area. This is been  present for the last several months. He was supposed to be using Iodoflex Kerlix and Coban however he was hospitalized from 09/05/2018 through 09/11/2018 with delirium secondary to pneumonia. Since then he is only been putting Vaseline gauze on this without compression. He also has a more recent skin tear on the dorsal right hand that may have only happened in the last week. The patient had arterial studies done in 2017 in January which was 3 years ago. At that point he had noncompressible ABIs but really quite good TBI's both normal. Triphasic waveforms on the right monophasic at the left posterior tibial but triphasic at the left dorsalis pedis. His ABIs in our clinic today were both noncompressible 1/23; the patient has wounds on the right dorsal hand just distal to the wrist and on the left anterior lower extremity. Both of these look very healthy he is using Hydrofera Blue 1/30; left anterior lower extremity wound much smaller. Healthy looking surface. The laceration area just distal to the wrist on the dorsal hand on the right is also just about closed I used Hydrofera Blue here 2/6; left anterior lower extremity wound is much smaller but still open. The laceration area just distal to the wrist on the dorsal hand is fully epithelialized. 2/13; the patient's anterior lower extremity wound is closed. The laceration just distal to the wrist on the dorsal hand is also fully epithelialized and closed. The patient has external compression stockings which I think are 20/30 READMISSION 08/06/2019 Mr. Murley is a 73 year old man with had several times previously in our clinic. He is a diabetic with a history of chronic renal insufficiency status post kidney transplant in 1983 and again in 2005. He was then in 2017 with a laceration on the left lower extremity. He was worked up at the time with arterial studies and reflux studies. The arterial studies showed ABIs to be noncompressible but TBI's were  within normal limits. I do not have the reflux studies at the moment. He was also sent here in 2019 with a left lower extremity wound and then again in 2020 with left lower extremity trauma a skin tear on the wrist. He was discharged with 20/30 stockings identified from myself that that might not be enough compression. Nevertheless he states he was wearing these fairly reliably. In September he had a fall with a substantial bruise in the area of the wound. He says he saw orthopedics and they told him there was some muscle strain sometime it later this opened into a wound. He has a fairly substantial wound on the right posterior calf. Satellite areas around this including medially and posteriorly. He has not worn his stockings since the injury Past medical history; includes chronic renal failure secondary to diabetes with kidney transplant x2, atrial fibrillation, heart failure with preserved ejection fraction, coronary artery disease. ABIs on the right in our clinic were once again noncompressible 08/13/2019 on evaluation today patient appears to be doing decently well with regard to his wound compared to last week's evaluation. Unfortunately he is still having a lot of discomfort at this point which is I think in some part due to the 3 layer compression wrap which is a little bit stronger I think for him. When he was here before we actually utilized a Kerlix  and Coban wrap which he states seemed to got a little bit better. Nonetheless I think we can probably drop back to this in light of the discomfort that he had. Nonetheless the pain was not really right around the wound itself as much as it was around the ankle in particular. The Iodoflex does seem to have done well for him As the wound is appearing somewhat better today which is excellent news. 11/30; still complaining of a lot of pain. Apparently arterial studies I ordered 2 weeks ago are below. I do not believe we have an appointment with  vein and vascular as of yet; ABI Findings: +---------+------------------+-----+----------+--------+ Right Rt Pressure (mmHg)IndexWaveform Comment  +---------+------------------+-----+----------+--------+ PTA >254 1.50 monophasic  +---------+------------------+-----+----------+--------+ DP >254 1.50 monophasic  +---------+------------------+-----+----------+--------+ Great Toe68 0.40 Abnormal   +---------+------------------+-----+----------+--------+ +---------+------------------+-----+----------+-------+ Left Lt Pressure (mmHg)IndexWaveform Comment +---------+------------------+-----+----------+-------+ Brachial 169     +---------+------------------+-----+----------+-------+ PTA >254 1.50 monophasic  +---------+------------------+-----+----------+-------+ DP >254 1.50 monophasic  +---------+------------------+-----+----------+-------+ Great Toe40 0.24 Abnormal   +---------+------------------+-----+----------+-------+ Pedal arteries appear hyperemic. Patient refused Brachial pressure in the right arm. Summary: Right: Resting right ankle-brachial index indicates noncompressible right lower extremity arteries. The right toe- brachial index is abnormal. Left: Resting left ankle-brachial index indicates noncompressible left lower extremity arteries. The left toe-brachial index is abnormal. He is also having considerably more swelling in his left calf. This was not there when I last saw him 2 weeks ago. He tells me that some of the home health compression wraps have been slipping down and that may be the issue here however a month I am uncertain 48/1; sees vascular on December 22. Still complaining of a lot of pain. DVT study I did last time was negative for DVT 12/14; still complaining of pain which if this is arterial is certainly claudication and rest keeps him uncomfortable at night. He has an appointment with vein and vascular on  December 22. Wound surface is better using Iodoflex. Once the surface of this is satisfactory and we have exhausted the vascular route. Perhaps an advanced treatment option. He has a configuration of the venous ulceration although his arterial studies are not very good. The other issue is the patient has a transplanted kidney. This will make angiography difficult and challenging issue 12/21; still complaining of pain and drainage. We are using Iodoflex on the wound under compression. He sees Dr. Donnetta Hutching tomorrow to evaluate his noninvasive studies noted above. He has a transplanted kidney further complicating the options for angiography. 12/28; still using Iodoflex under compression. I have Dr. Luther Parody note from 12/22. he noted arterial studies revealing monophasic waveforms at the pedal vessels bilaterally and calcified vessels making the ABI unreliable. He did not comment on the reduced TBI's. He felt these were venous wounds based on the palpable dorsalis pedis pulse. 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 And they arranged for formal venous duplex with reflux studies in the next several weeks. Follow-up with either Scot Dock or Dr. Oneida Alar Electronic Signature(s) Signed: 09/17/2019 6:10:19 PM By: Michael Ham MD Entered By: [name] on 09/17/2019 12:15:00 -------------------------------------------------------------------------------- Physical Exam Details Patient Name: Date of Service: [name], [name] 09/17/2019 9:15 AM Medical Record [id number] Patient Account Number: [id number] Date of Birth/Sex: Treating RN: 1946-02-09 (73 y.o. M) Primary Care Provider: Donetta [name] Other Clinician: Referring Provider: Treating Provider/Extender:Rema Lievanos, [name], [name] in Treatment: 6 Constitutional Patient is hypertensive.. Pulse regular and within target range for patient.Marland Kitchen Respirations regular, non-labored and within target range.. Temperature  is normal and within the target range  for the patient.Marland Kitchen Appears in no distress. Cardiovascular On the right dorsalis pedis pulses are palpable. Notes Wound exam; improvement in the condition of the wound continues. Again debridement with a #5 curette of the lower 40 to 50% of the wound area hemostasis with direct pressure. Electronic Signature(s) Signed: 09/17/2019 6:10:19 PM By: Michael Ham MD Entered By: [name] on 09/17/2019 12:16:23 -------------------------------------------------------------------------------- Physician Orders Details Patient Name: Date of Service: [name], [name] 09/17/2019 9:15 AM Medical Record [id number] Patient Account Number: [id number] Date of Birth/Sex: Treating RN: 18-Aug-1946 (73 y.o. [name] Primary Care Provider: Donetta [name] Other Clinician: Referring Provider: Treating Provider/Extender:Virna Livengood, [name], [name] in Treatment: 6 Verbal / Phone Orders: No Diagnosis Coding ICD-10 Coding Code Description S80.11XD Contusion of right lower leg, subsequent encounter L97.212 Non-pressure chronic ulcer of right calf with fat layer exposed I87.321 Chronic venous hypertension (idiopathic) with inflammation of right lower extremity E11.622 Type 2 diabetes mellitus with other skin ulcer Follow-up Appointments Return Appointment in 1 week. Dressing Change Frequency Wound #3 Right,Posterior Lower Leg Change dressing three times week. - 2x by home health on Wednesdays and Fridays, 1x by wound clinic on Mondays Skin Barriers/Peri-Wound Care Barrier cream Moisturizing lotion Wound Cleansing Clean wound with Normal Saline. - or normal saline on days that dressing is changed May shower with protection. Primary Wound Dressing Wound #3 Right,Posterior Lower Leg Iodoflex Secondary Dressing Wound #3 Right,Posterior Lower Leg ABD pad Kerramax - or Zetuvit (or other super absorbent dressing i.e.  Lauraine Rinne) Edema Control Kerlix and Coban - Right Lower Extremity Avoid standing for long periods of time Elevate legs to the level of the heart or above for 30 minutes daily and/or when sitting, a frequency of: - throughout the day Exercise regularly [facility] for wound care. - [facility] Electronic Signature(s) Signed: 09/17/2019 6:10:06 PM By: [name] RN, BSN Signed: 09/17/2019 6:10:19 PM By: Michael Ham MD Entered By: [name] on 09/17/2019 10:53:20 -------------------------------------------------------------------------------- Problem List Details Patient Name: Date of Service: [name], [name] 09/17/2019 9:15 AM Medical Record [id number] Patient Account Number: [id number] Date of Birth/Sex: Treating RN: 1946-07-31 (73 y.o. 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 layer 08/06/2019 No Yes exposed I87.321 Chronic venous hypertension (idiopathic) with 08/06/2019 No Yes inflammation of right lower extremity E11.622 Type 2 diabetes mellitus with other skin ulcer 08/06/2019 No Yes Inactive Problems Resolved Problems Electronic Signature(s) Signed: 09/17/2019 6:10:19 PM By: Michael Ham MD Entered By: [name] on 09/17/2019 12:10:34 -------------------------------------------------------------------------------- Progress Note Details Patient Name: Date of Service: [name], [name] 09/17/2019 9:15 AM Medical Record [id number] Patient Account Number: [id number] Date of Birth/Sex: Treating RN: 1946-01-11 (73 y.o. M) Primary Care  Provider: Donetta [name] Other Clinician: Referring Provider: Treating Provider/Extender:Hardy Harcum, [name], [name] in Treatment: 6 Subjective History of Present Illness (HPI) [place] HPI Description: 73 year old gentleman who was recently seen by his nephrologist Dr. Donato [name], and noted to have a wound on his left lower extremity which was lacerated 2 months ago and now has reopened. The patient's left shin has a ulceration with some exudate but no evidence of infection and he was referred to [place] for further care as it was known that the patient has had  some peripheral vascular disease in the past. Past medical history significant for chronic kidney disease, atrial fibrillation, diabetes mellitus,status post kidney transplant in 1983 and 2005, a week fistula graft placement, status post previous bowel surgery. 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 10/06/2015 -- x-ray of the left tibia and fibula shows no evidence of osteomyelitis. The patient has also had Doppler studies of his extremity and is awaiting the appointment with the vascular surgeon. We have not yet received these reports. 10/13/2015 -- lower extremity venous duplex reflux evaluation shows reflux in the left common femoral vein, left saphenofemoral junction and the proximal greater saphenous vein extending to the proximal calf. There is also reflux in the left proximal to mid small saphenous vein. Arterial duplex studies done showed the resting ABI was not applicable due to tibial artery medial calcification. 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 The right TBI was 1 which is normal and the left ABI was 1 which is normal. Patient has otherwise been doing fine and has been compliant with his dressings. 10/20/2015 -- He was seen by Dr.  Adele [name] recently for a vascular opinion on 10/15/2015. His left lower extremity venous insufficiency duplex study revealed GSV reflux,SS vein reflux and deep venous reflux in the common femoral vein. His ABIs were non compressible and his TBI on the right was 1.01 and on the left was 0.80. He was asked to continue with the wound care with compressive therapy followed by EVLA of the left GS vein 3 months. He recommended 20-30 mm thigh-high compression stockings and the need for a three-month trial of this. The patient had an Unna boot applied at the vascular office but he could not tolerate this with a lot of pain and issues with his toes and hence came here on Friday for removal of this and we reapplied a 2 layer compression. 11/10/2015 -- patient still has not purchased his 20-30 mm thigh-high compression stockings as prescribed by [name]. Readmission: 08/08/18 on evaluation today patient presents for readmission concerning a new injury to the left anterior lower extremity. He was previously seen in 2017 here in our clinic. He states that he has done fairly well since that point. Nonetheless he is having at this time some pain but states that he hit this on a table that fell over and actually struck his leg. This appears to have pulled back some of his skin which folded in on itself and is causing some difficulty as far as that is concerned. There does not appear to be any evidence of infection at this time. No fevers, chills, nausea, or vomiting noted at this time. He's been using dressings on his own currently without complication. 08/15/18 on evaluation today patient actually appears to be doing somewhat better in regard to his wound of the lower [name] when compared to the first visit last week. I had to do a much more extensive debridement at that time it does appear that I'm gonna have to perform some debridement today but it does not look to be as extensive by any means. Nonetheless  fortunately he does not show any signs of infection he does have discomfort at this site. I believe based on what I'm seeing currently he may benefit from Iodoflex to help keep the wound bed clean. Patient tolerated therapy without complication.  Upon evaluation today the patient actually appears to be doing excellent in regard to his left lower extremity ulcer. This is much better than the previous two visits where he had a lot of necrotic tissue around the edge of the wound simply due to the fact that again there was a significant skin tear where the edge had been cleared away prior to reattaching and being able to heal appropriately. He seems to be doing much better at this point. 08/28/18 on evaluation today the patient's wound actually does appear to be showing signs of improvement. With that being said though he is improving he would likely note even greater improvement if we were able to sharply debride the wound. Nonetheless this caused him to much discomfort he tells me. 09/04/18 on evaluation today patient actually appears to be showing signs of improvement in regard to his left lower extremity ulcer. He has been tolerating the dressing changes including the wrap although he tells me at this point that the burning does last for a couple of days even with just the Iodoflex. I was afraid that this may been part of the issue that he was having with discomfort. It does seem to be the case. Nonetheless he shows no signs of evidence of infection at this time which is good news. No fevers chills noted ADMISSION to [name] 10[facility]05/2018 This is a patient who was cared for in 2017 and again in the fall of this year at our sister clinic and [facility]. He actually lives in [place] in [place]. We have been dealing with an apparent traumatic area on the left anterior tibial area. This is been present for the last several months. He was supposed to be using Iodoflex Kerlix and  Coban however he was hospitalized from 09/05/2018 through 09/11/2018 with delirium secondary to pneumonia. Since then he is only been putting Vaseline gauze on this without compression. He also has a more recent skin tear on the dorsal right hand that may have only happened in the last week. The patient had arterial studies done in 2017 in January which was 3 years ago. At that point he had noncompressible ABIs but really quite good TBI's both normal. Triphasic waveforms on the right monophasic at the left posterior tibial but triphasic at the left dorsalis pedis. His ABIs in our clinic today were both noncompressible 1/23; the patient has wounds on the right dorsal hand just distal to the wrist and on the left anterior lower extremity. Both of these look very healthy he is using Hydrofera Blue 1/30; left anterior lower extremity wound much smaller. Healthy looking surface. The laceration area just distal to the wrist on the dorsal hand on the right is also just about closed I used Hydrofera Blue here 2/6; left anterior lower extremity wound is much smaller but still open. The laceration area just distal to the wrist on the dorsal hand is fully epithelialized. 2/13; the patient's anterior lower extremity wound is closed. The laceration just distal to the wrist on the dorsal hand is also fully epithelialized and closed. The patient has external compression stockings which I think are 20/30 READMISSION 08/06/2019 Mr. Antenucci is a 73 year old man with had several times previously in our clinic. He is a diabetic with a history of chronic renal insufficiency status post kidney transplant in 1983 and again in 2005. He was then in 2017 with a laceration on the left lower extremity. He was worked up at the time with arterial studies and  reflux studies. The arterial studies showed ABIs to be noncompressible but TBI's were within normal limits. I do not have the reflux studies at the moment. He was also  sent here in 2019 with a left lower extremity wound and then again in 2020 with left lower extremity trauma a skin tear on the wrist. He was discharged with 20/30 stockings identified from myself that that might not be enough compression. Nevertheless he states he was wearing these fairly reliably. In September he had a fall with a substantial bruise in the area of the wound. He says he saw orthopedics and they told him there was some muscle strain sometime it later this opened into a wound. He has a fairly substantial wound on the right posterior calf. Satellite areas around this including medially and posteriorly. He has not worn his stockings since the injury Past medical history; includes chronic renal failure secondary to diabetes with kidney transplant x2, atrial fibrillation, heart failure with preserved ejection fraction, coronary artery disease. ABIs on the right in our clinic were once again noncompressible 08/13/2019 on evaluation today patient appears to be doing decently well with regard to his wound compared to last week's evaluation. Unfortunately he is still having a lot of discomfort at this point which is I think in some part due to the 3 layer compression wrap which is a little bit stronger I think for him. When he was here before we actually utilized a Kerlix and Coban wrap which he states seemed to got a little bit better. Nonetheless I think we can probably drop back to this in light of the discomfort that he had. Nonetheless the pain was not really right around the wound itself as much as it was around the ankle in particular. The Iodoflex does seem to have done well for him As the wound is appearing somewhat better today which is excellent news. 11/30; still complaining of a lot of pain. Apparently arterial studies I ordered 2 weeks ago are below. I do not believe we have an appointment with vein and vascular as of yet; ABI  Findings: +---------+------------------+-----+----------+--------+ Right Rt Pressure (mmHg)IndexWaveform Comment  +---------+------------------+-----+----------+--------+ PTA >254 1.50 monophasic  +---------+------------------+-----+----------+--------+ DP >254 1.50 monophasic  +---------+------------------+-----+----------+--------+ Great Toe68 0.40 Abnormal   +---------+------------------+-----+----------+--------+ +---------+------------------+-----+----------+-------+ Left Lt Pressure (mmHg)IndexWaveform Comment +---------+------------------+-----+----------+-------+ Brachial 169     +---------+------------------+-----+----------+-------+ PTA >254 1.50 monophasic  +---------+------------------+-----+----------+-------+ DP >254 1.50 monophasic  +---------+------------------+-----+----------+-------+ Great Toe40 0.24 Abnormal   +---------+------------------+-----+----------+-------+ Pedal arteries appear hyperemic. Patient refused Brachial pressure in the right arm. Summary: Right: Resting right ankle-brachial index indicates noncompressible right lower extremity arteries. The right toe- brachial index is abnormal. Left: Resting left ankle-brachial index indicates noncompressible left lower extremity arteries. The left toe-brachial index is abnormal. He is also having considerably more swelling in his left calf. This was not there when I last saw him 2 weeks ago. He tells me that some of the home health compression wraps have been slipping down and that may be the issue here however a month I am uncertain 60/1; sees vascular on December 22. Still complaining of a lot of pain. DVT study I did last time was negative for DVT 12/14; still complaining of pain which if this is arterial is certainly claudication and rest keeps him uncomfortable at night. He has an appointment with vein and vascular on December 22. Wound surface is  better using Iodoflex. Once the surface of this is satisfactory and we have exhausted the vascular route. Perhaps an advanced treatment option. He  has a configuration of the venous ulceration although his arterial studies are not very good. The other issue is the patient has a transplanted kidney. This will make angiography difficult and challenging issue 12/21; still complaining of pain and drainage. We are using Iodoflex on the wound under compression. He sees Dr. Donnetta Hutching tomorrow to evaluate his noninvasive studies noted above. He has a transplanted kidney further complicating the options for angiography. 12/28; still using Iodoflex under compression. I have Dr. Luther Parody note from 12/22. he noted arterial studies revealing monophasic waveforms at the pedal vessels bilaterally and calcified vessels making the ABI unreliable. He did not comment on the reduced TBI's. He felt these were venous wounds based on the palpable dorsalis pedis pulse. 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 And they arranged for formal venous duplex with reflux studies in the next several weeks. Follow-up with either Scot Dock or Dr. Oneida Alar Objective Constitutional Patient is hypertensive.. Pulse regular and within target range for patient.Marland Kitchen Respirations regular, non-labored and within target range.. Temperature is normal and within the target range for the patient.Marland Kitchen Appears in no distress. Vitals Time Taken: 9:41 AM, Height: 67 in, Weight: 190 lbs, BMI: 29.8, Temperature: 98.3 F, Pulse: 52 bpm, Respiratory Rate: 20 breaths/min, Blood Pressure: 156/64 mmHg, Capillary Blood Glucose: 120 mg/dl. Cardiovascular On the right dorsalis pedis pulses are palpable. General Notes: Wound exam; improvement in the condition of the wound continues. Again debridement with a #5 curette of the lower 40 to 50% of the wound area hemostasis with direct pressure. Integumentary (Hair, Skin) Wound #3 status is Open. Original cause of  wound was Trauma. The wound is located on the Right,Posterior Lower Leg. The wound measures 15.8cm length x 6cm width x 0.3cm depth; 74.456cm^2 area and 22.337cm^3 volume. There is Fat Layer (Subcutaneous Tissue) Exposed exposed. There is no tunneling or undermining noted. There is a large amount of purulent drainage noted. Foul odor after cleansing was noted. The wound margin is well defined and not attached to the wound base. There is medium (34-66%) pink granulation within the wound bed. There is a medium (34-66%) amount of necrotic tissue within the wound bed including Adherent Slough. 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 There was a Excisional Skin/Subcutaneous Tissue Debridement with a total area of 16 sq cm performed by [name]., MD. With the following instrument(s): Curette to remove Viable and Non-Viable tissue/material. Material removed includes Eschar, Subcutaneous Tissue, and Slough after achieving pain control using Other (Benzocaine 20%). No specimens were taken. A time out was conducted at 10:48, prior to the start of the procedure. A Moderate amount of bleeding was controlled with Pressure. The procedure was tolerated well with a pain level of 5 throughout and a pain level of 3 following the procedure. Post Debridement Measurements: 15.8cm length x 6cm width x 0.3cm depth; 22.337cm^3 volume. Character of Wound/Ulcer Post Debridement requires further debridement. Severity of Tissue Post Debridement is: Fat layer exposed. Post procedure Diagnosis Wound  #3: Same as Pre-Procedure Plan Follow-up Appointments: Return Appointment in 1 week. Dressing Change Frequency: Wound #3 Right,Posterior Lower Leg: Change dressing three times week. - 2x by home health  on Wednesdays and Fridays, 1x by wound clinic on Mondays Skin Barriers/Peri-Wound Care: Barrier cream Moisturizing lotion Wound Cleansing: Clean wound with Normal Saline. - or normal saline on days that dressing is changed May shower with protection. Primary Wound Dressing: Wound #3 Right,Posterior Lower Leg: Iodoflex Secondary Dressing: Wound #3 Right,Posterior Lower Leg: ABD pad Kerramax - or Zetuvit (or other super absorbent dressing i.e. Lauraine Rinne) Edema Control: Kerlix and Coban - Right Lower Extremity Avoid standing for long periods of time Elevate legs to the level of the heart or above for 30 minutes daily and/or when sitting, a frequency of: - throughout the day Exercise regularly Home Health: [facility] for wound [facility]. - [facility]. We continued with the Iodoflex still under Kerlix and Coban. 2. I am a bit surprised that Dr. Donnetta Hutching dismissed the reduced TBI is so easily and for this reason I am still reluctant to markedly elevate the compression. May try 3 layer compression but certainly will not go above that 3. Venous reflux studies are being arranged 4. I am going to try to see about Apligraf may require 2 complete graft to cover the surface area Electronic Signature(s) Signed: 09/17/2019 6:10:19 PM By: Michael Ham MD Entered By: [name] on 09/17/2019 12:18:37 -------------------------------------------------------------------------------- SuperBill Details Patient Name: Date of Service: [name], [name] 09/17/2019 Medical Record [id number] Patient Account Number: [id number] Date of Birth/Sex: Treating RN: 08-21-1946 (73 y.o. 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 E11.622 Type 2 diabetes mellitus with other skin ulcer Facility Procedures CPT4 Code: [id number] Description: 62035 - DEB SUBQ TISSUE 20 SQ CM/< ICD-10 Diagnosis Description L97.212 Non-pressure chronic ulcer of right calf with fat layer expos Modifier: 1 ed Quantity: Physician Procedures CPT4 Code Description: [id number] 11042 - WC PHYS SUBQ TISS 20 SQ CM ICD-10 Diagnosis Description A45.364 Non-pressure chronic ulcer of right calf with fat laye Modifier: r exposed Quantity: 1 Electronic Signature(s) Signed: 09/17/2019 6:10:19 PM By: Michael Ham MD Entered By: [name] on 09/17/2019 12:25:02

## 2019-09-24 ENCOUNTER — Ambulatory Visit (HOSPITAL_COMMUNITY)
Admission: RE | Admit: 2019-09-24 | Discharge: 2019-09-24 | Disposition: A | Discharge status: Home / Self Care | Payer: Medicare Other | Source: Ambulatory Visit | Attending: Nephrology | Admitting: Nephrology

## 2019-09-24 ENCOUNTER — Other Ambulatory Visit: Payer: Self-pay

## 2019-09-24 ENCOUNTER — Encounter (HOSPITAL_BASED_OUTPATIENT_CLINIC_OR_DEPARTMENT_OTHER): Payer: Medicare Other | Admitting: Internal Medicine

## 2019-09-24 DIAGNOSIS — I110 Hypertensive heart disease with heart failure: Secondary | ICD-10-CM | POA: Diagnosis not present

## 2019-09-24 DIAGNOSIS — L97518 Non-pressure chronic ulcer of other part of right foot with other specified severity: Secondary | ICD-10-CM | POA: Insufficient documentation

## 2019-09-24 DIAGNOSIS — I4891 Unspecified atrial fibrillation: Secondary | ICD-10-CM | POA: Diagnosis not present

## 2019-09-24 DIAGNOSIS — N185 Chronic kidney disease, stage 5: Secondary | ICD-10-CM | POA: Insufficient documentation

## 2019-09-24 DIAGNOSIS — Z4822 Encounter for aftercare following kidney transplant: Secondary | ICD-10-CM | POA: Insufficient documentation

## 2019-09-24 DIAGNOSIS — W2203XD Walked into furniture, subsequent encounter: Secondary | ICD-10-CM | POA: Diagnosis not present

## 2019-09-24 DIAGNOSIS — E11622 Type 2 diabetes mellitus with other skin ulcer: Secondary | ICD-10-CM | POA: Insufficient documentation

## 2019-09-24 DIAGNOSIS — I87321 Chronic venous hypertension (idiopathic) with inflammation of right lower extremity: Secondary | ICD-10-CM | POA: Diagnosis not present

## 2019-09-24 DIAGNOSIS — E1151 Type 2 diabetes mellitus with diabetic peripheral angiopathy without gangrene: Secondary | ICD-10-CM | POA: Insufficient documentation

## 2019-09-24 DIAGNOSIS — D631 Anemia in chronic kidney disease: Secondary | ICD-10-CM | POA: Diagnosis present

## 2019-09-24 DIAGNOSIS — S8011XD Contusion of right lower leg, subsequent encounter: Secondary | ICD-10-CM | POA: Insufficient documentation

## 2019-09-24 DIAGNOSIS — M109 Gout, unspecified: Secondary | ICD-10-CM | POA: Diagnosis not present

## 2019-09-24 DIAGNOSIS — E119 Type 2 diabetes mellitus without complications: Secondary | ICD-10-CM | POA: Diagnosis not present

## 2019-09-24 DIAGNOSIS — I2510 Atherosclerotic heart disease of native coronary artery without angina pectoris: Secondary | ICD-10-CM | POA: Diagnosis not present

## 2019-09-24 DIAGNOSIS — I509 Heart failure, unspecified: Secondary | ICD-10-CM | POA: Insufficient documentation

## 2019-09-24 DIAGNOSIS — D649 Anemia, unspecified: Secondary | ICD-10-CM | POA: Insufficient documentation

## 2019-09-24 DIAGNOSIS — L97212 Non-pressure chronic ulcer of right calf with fat layer exposed: Secondary | ICD-10-CM | POA: Insufficient documentation

## 2019-09-24 LAB — HEMOGLOBIN AND HEMATOCRIT, BLOOD
HCT: 30 % — ABNORMAL LOW (ref 39.0–52.0)
Hemoglobin: 8.5 g/dL — ABNORMAL LOW (ref 13.0–17.0)

## 2019-09-24 MED ORDER — DARBEPOETIN ALFA 150 MCG/0.3ML IJ SOSY
120.0000 ug | PREFILLED_SYRINGE | INTRAMUSCULAR | Status: DC
  Administered 2019-09-24: 120 ug via SUBCUTANEOUS
  Filled 2019-09-24: qty 0.3

## 2019-09-24 NOTE — Discharge Instructions (Signed)
Darbepoetin Alfa injection What is this medicine? DARBEPOETIN ALFA (dar be POE e tin AL fa) helps your body make more red blood cells. It is used to treat anemia caused by chronic kidney failure and chemotherapy. This medicine may be used for other purposes; ask your health care provider or pharmacist if you have questions. COMMON BRAND NAME(S): Aranesp What should I tell my health care provider before I take this medicine? They need to know if you have any of these conditions:  blood clotting disorders or history of blood clots  cancer patient not on chemotherapy  cystic fibrosis  heart disease, such as angina, heart failure, or a history of a heart attack  hemoglobin level of 12 g/dL or greater  high blood pressure  low levels of folate, iron, or vitamin B12  seizures  an unusual or allergic reaction to darbepoetin, erythropoietin, albumin, hamster proteins, latex, other medicines, foods, dyes, or preservatives  pregnant or trying to get pregnant  breast-feeding How should I use this medicine? This medicine is for injection into a vein or under the skin. It is usually given by a health care professional in a hospital or clinic setting. If you get this medicine at home, you will be taught how to prepare and give this medicine. Use exactly as directed. Take your medicine at regular intervals. Do not take your medicine more often than directed. It is important that you put your used needles and syringes in a special sharps container. Do not put them in a trash can. If you do not have a sharps container, call your pharmacist or healthcare provider to get one. A special MedGuide will be given to you by the pharmacist with each prescription and refill. Be sure to read this information carefully each time. Talk to your pediatrician regarding the use of this medicine in children. While this medicine may be used in children as young as 1 month of age for selected conditions, precautions do  apply. Overdosage: If you think you have taken too much of this medicine contact a poison control center or emergency room at once. NOTE: This medicine is only for you. Do not share this medicine with others. What if I miss a dose? If you miss a dose, take it as soon as you can. If it is almost time for your next dose, take only that dose. Do not take double or extra doses. What may interact with this medicine? Do not take this medicine with any of the following medications:  epoetin alfa This list may not describe all possible interactions. Give your health care provider a list of all the medicines, herbs, non-prescription drugs, or dietary supplements you use. Also tell them if you smoke, drink alcohol, or use illegal drugs. Some items may interact with your medicine. What should I watch for while using this medicine? Your condition will be monitored carefully while you are receiving this medicine. You may need blood work done while you are taking this medicine. This medicine may cause a decrease in vitamin B6. You should make sure that you get enough vitamin B6 while you are taking this medicine. Discuss the foods you eat and the vitamins you take with your health care professional. What side effects may I notice from receiving this medicine? Side effects that you should report to your doctor or health care professional as soon as possible:  allergic reactions like skin rash, itching or hives, swelling of the face, lips, or tongue  breathing problems  changes in   vision  chest pain  confusion, trouble speaking or understanding  feeling faint or lightheaded, falls  high blood pressure  muscle aches or pains  pain, swelling, warmth in the leg  rapid weight gain  severe headaches  sudden numbness or weakness of the face, arm or leg  trouble walking, dizziness, loss of balance or coordination  seizures (convulsions)  swelling of the ankles, feet, hands  unusually weak or  tired Side effects that usually do not require medical attention (report to your doctor or health care professional if they continue or are bothersome):  diarrhea  fever, chills (flu-like symptoms)  headaches  nausea, vomiting  redness, stinging, or swelling at site where injected This list may not describe all possible side effects. Call your doctor for medical advice about side effects. You may report side effects to FDA at 1-800-FDA-1088. Where should I keep my medicine? Keep out of the reach of children. Store in a refrigerator between 2 and 8 degrees C (36 and 46 degrees F). Do not freeze. Do not shake. Throw away any unused portion if using a single-dose vial. Throw away any unused medicine after the expiration date. NOTE: This sheet is a summary. It may not cover all possible information. If you have questions about this medicine, talk to your doctor, pharmacist, or health care provider.  2020 Elsevier/Gold Standard (2017-09-21 16:44:20)  

## 2019-09-24 NOTE — Progress Notes (Signed)
PATIENT CARE CENTER NOTE  Diagnosis:Anemia associated with Chronic Renal Failure   Provider:Coladonato, Broadus John, MD   Procedure:Aranesp injections   Note:Patient received sub-q Aranesp injectionin left arm. Tolerated injection well. No adverse reaction noted. Pre-injection, Hemoglobin8.5and BP 142/73. Discharge paperwork given to patient. Alert, oriented and ambulatory at discharge.

## 2019-09-24 NOTE — Progress Notes (Signed)
INIGO, LANTIGUA ([id number]) Visit Report for 09/24/2019 Debridement Details Patient Name: Date of Service: [name], [name] 09/24/2019 10:30 AM Medical Record [id number] Patient Account Number: [id number] Date of Birth/Sex: [birth year] (73 y.o. M) Treating RN: Primary Care Provider: [name] Other Clinician: Referring Provider: Treating Provider/Extender:Robson, [name], [name] in Treatment: 7 Debridement Performed for Wound #3 Right,Posterior Lower Leg Assessment: Performed By: Physician Ricard Dillon., MD Debridement Type: Debridement Severity of Tissue Pre Fat layer exposed Debridement: Level of Consciousness (Pre- Awake and Alert procedure): Pre-procedure Verification/Time Out Taken: Yes - 11:02 Start Time: 11:02 Total Area Debrided (L x W): 4 (cm) x 4 (cm) = 16 (cm) Tissue and other material Viable, Non-Viable, Slough, Subcutaneous, Slough debrided: Level: Skin/Subcutaneous Tissue Debridement Description: Excisional Instrument: Curette Bleeding: Minimum Hemostasis Achieved: Pressure End Time: 11:03 Procedural Pain: 5 Post Procedural Pain: 2 Response to Treatment: Procedure was tolerated well Level of Consciousness Awake and Alert (Post-procedure): Post Debridement Measurements of Total Wound Length: (cm) 15.8 Width: (cm) 5.5 Depth: (cm) 0.3 Volume: (cm) 20.475 Character of Wound/Ulcer Post Requires Further Debridement Debridement: Severity of Tissue Post Debridement: Fat layer exposed Post Procedure Diagnosis Same as Pre-procedure Electronic Signature(s) Signed: 09/24/2019 4:56:38 PM By: [name] MD Entered By: [name] on 09/24/2019 12:35:55 -------------------------------------------------------------------------------- HPI Details Patient Name: Date of Service: [name], [name] 09/24/2019 10:30 AM Medical Record [id number] Patient Account Number: [id number] Date of Birth/Sex: Treating  RN: 01-22-1946 (74 y.o. M) Primary Care Provider: [name] Other Clinician: Referring Provider: Treating Provider/Extender:Robson, [name], [name] in Treatment: 7 History of Present Illness HPI Description: [name] HPI Description: 74 year old gentleman who was recently seen by his nephrologist [name], and noted to have a wound on his left lower extremity which was lacerated 2 months ago and now has reopened. The patient's left shin has a ulceration with some exudate but no evidence of infection and he was referred to [place] for further care as it was known that the patient has had some peripheral vascular disease in the past. Past medical history significant for chronic kidney disease, atrial fibrillation, diabetes mellitus,status post kidney transplant in 1983 and 2005, a week fistula graft placement, status post previous bowel surgery. 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 10/06/2015 -- x-ray of the left tibia and fibula shows no evidence of osteomyelitis. The patient has also had Doppler studies of his extremity and is awaiting the appointment with the vascular surgeon. We have not yet received these reports. 10/13/2015 -- lower extremity venous duplex reflux evaluation shows reflux in the left common femoral vein, left saphenofemoral junction and the proximal greater saphenous vein extending to the proximal calf. There is also reflux in the left proximal to mid small saphenous vein. Arterial duplex studies done showed the resting ABI was not applicable due to tibial artery medial calcification. 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 The right TBI was 1 which is normal and the left ABI was 1 which is normal. Patient has otherwise been doing fine and has been compliant with his  dressings. 10/20/2015 -- He was seen by [name] recently for a vascular opinion on 10/15/2015. His left lower extremity venous insufficiency duplex study revealed GSV reflux,SS vein reflux and deep venous reflux in the common femoral vein. His ABIs were non compressible and his TBI on  the right was 1.01 and on the left was 0.80. He was asked to continue with the wound care with compressive therapy followed by EVLA of the left GS vein 3 months. He recommended 20-30 mm thigh-high compression stockings and the need for a three-month trial of this. The patient had an Unna boot applied at the vascular office but he could not tolerate this with a lot of pain and issues with his toes and hence came here on Friday for removal of this and we reapplied a 2 layer compression. 11/10/2015 -- patient still has not purchased his 20-30 mm thigh-high compression stockings as prescribed by [name]. Readmission: 08/08/18 on evaluation today patient presents for readmission concerning a new injury to the left anterior lower extremity. He was previously seen in 2017 here in our clinic. He states that he has done fairly well since that point. Nonetheless he is having at this time some pain but states that he hit this on a table that fell over and actually struck his leg. This appears to have pulled back some of his skin which folded in on itself and is causing some difficulty as far as that is concerned. There does not appear to be any evidence of infection at this time. No fevers, chills, nausea, or vomiting noted at this time. He's been using dressings on his own currently without complication. 08/15/18 on evaluation today patient actually appears to be doing somewhat better in regard to his wound of the lower [name] when compared to the first visit last week. I had to do a much more extensive debridement at that time it does appear that I'm gonna have to perform some debridement today but it does not look  to be as extensive by any means. Nonetheless fortunately he does not show any signs of infection he does have discomfort at this site. I believe based on what I'm seeing currently he may benefit from Iodoflex to help keep the wound bed clean. Patient tolerated therapy without complication. Upon evaluation today the patient actually appears to be doing excellent in regard to his left lower extremity ulcer. This is much better than the previous two visits where he had a lot of necrotic tissue around the edge of the wound simply due to the fact that again there was a significant skin tear where the edge had been cleared away prior to reattaching and being able to heal appropriately. He seems to be doing much better at this point. 08/28/18 on evaluation today the patient's wound actually does appear to be showing signs of improvement. With that being said though he is improving he would likely note even greater improvement if we were able to sharply debride the wound. Nonetheless this caused him to much discomfort he tells me. 09/04/18 on evaluation today patient actually appears to be showing signs of improvement in regard to his left lower extremity ulcer. He has been tolerating the dressing changes including the wrap although he tells me at this point that the burning does last for a couple of days even with just the Iodoflex. I was afraid that this may been part of the issue that he was having with discomfort. It does seem to be the case. Nonetheless he shows no signs of evidence of infection at this time which is good news. No fevers chills noted ADMISSION to [name] 10/05/2018 This is a patient who was cared for in 2017 and again in the fall of this year at our sister  clinic and [facility]. He actually lives in [place] in [place]. We have been dealing with an apparent traumatic area on the left anterior tibial area. This is been present for the last several months. He was  supposed to be using Iodoflex Kerlix and Coban however he was hospitalized from 09/05/2018 through 09/11/2018 with delirium secondary to pneumonia. Since then he is only been putting Vaseline gauze on this without compression. He also has a more recent skin tear on the dorsal right hand that may have only happened in the last week. The patient had arterial studies done in 2017 in January which was 3 years ago. At that point he had noncompressible ABIs but really quite good TBI's both normal. Triphasic waveforms on the right monophasic at the left posterior tibial but triphasic at the left dorsalis pedis. His ABIs in our clinic today were both noncompressible 1/23; the patient has wounds on the right dorsal hand just distal to the wrist and on the left anterior lower extremity. Both of these look very healthy he is using Hydrofera Blue 1/30; left anterior lower extremity wound much smaller. Healthy looking surface. The laceration area just distal to the wrist on the dorsal hand on the right is also just about closed I used Hydrofera Blue here 2/6; left anterior lower extremity wound is much smaller but still open. The laceration area just distal to the wrist on the dorsal hand is fully epithelialized. 2/13; the patient's anterior lower extremity wound is closed. The laceration just distal to the wrist on the dorsal hand is also fully epithelialized and closed. The patient has external compression stockings which I think are 20/30 READMISSION 08/06/2019 Mr. Wolfson is a 74 year old man with had several times previously in our clinic. He is a diabetic with a history of chronic renal insufficiency status post kidney transplant in 1983 and again in 2005. He was then in 2017 with a laceration on the left lower extremity. He was worked up at the time with arterial studies and reflux studies. The arterial studies showed ABIs to be noncompressible but TBI's were within normal limits. I do not have the  reflux studies at the moment. He was also sent here in 2019 with a left lower extremity wound and then again in 2020 with left lower extremity trauma a skin tear on the wrist. He was discharged with 20/30 stockings identified from myself that that might not be enough compression. Nevertheless he states he was wearing these fairly reliably. In September he had a fall with a substantial bruise in the area of the wound. He says he saw orthopedics and they told him there was some muscle strain sometime it later this opened into a wound. He has a fairly substantial wound on the right posterior calf. Satellite areas around this including medially and posteriorly. He has not worn his stockings since the injury Past medical history; includes chronic renal failure secondary to diabetes with kidney transplant x2, atrial fibrillation, heart failure with preserved ejection fraction, coronary artery disease. ABIs on the right in our clinic were once again noncompressible 08/13/2019 on evaluation today patient appears to be doing decently well with regard to his wound compared to last week's evaluation. Unfortunately he is still having a lot of discomfort at this point which is I think in some part due to the 3 layer compression wrap which is a little bit stronger I think for him. When he was here before we actually utilized a Kerlix and Coban wrap which he states seemed  to got a little bit better. Nonetheless I think we can probably drop back to this in light of the discomfort that he had. Nonetheless the pain was not really right around the wound itself as much as it was around the ankle in particular. The Iodoflex does seem to have done well for him As the wound is appearing somewhat better today which is excellent news. 11/30; still complaining of a lot of pain. Apparently arterial studies I ordered 2 weeks ago are below. I do not believe we have an appointment with vein and vascular as of yet; ABI  Findings: +---------+------------------+-----+----------+--------+ Right Rt Pressure (mmHg)IndexWaveform Comment  +---------+------------------+-----+----------+--------+ PTA >254 1.50 monophasic  +---------+------------------+-----+----------+--------+ DP >254 1.50 monophasic  +---------+------------------+-----+----------+--------+ Great Toe68 0.40 Abnormal   +---------+------------------+-----+----------+--------+ +---------+------------------+-----+----------+-------+ Left Lt Pressure (mmHg)IndexWaveform Comment +---------+------------------+-----+----------+-------+ Brachial 169     +---------+------------------+-----+----------+-------+ PTA >254 1.50 monophasic  +---------+------------------+-----+----------+-------+ DP >254 1.50 monophasic  +---------+------------------+-----+----------+-------+ Great Toe40 0.24 Abnormal   +---------+------------------+-----+----------+-------+ Pedal arteries appear hyperemic. Patient refused Brachial pressure in the right arm. Summary: Right: Resting right ankle-brachial index indicates noncompressible right lower extremity arteries. The right toe- brachial index is abnormal. Left: Resting left ankle-brachial index indicates noncompressible left lower extremity arteries. The left toe-brachial index is abnormal. He is also having considerably more swelling in his left calf. This was not there when I last saw him 2 weeks ago. He tells me that some of the home health compression wraps have been slipping down and that may be the issue here however a month I am uncertain 75/6; sees vascular on December 22. Still complaining of a lot of pain. DVT study I did last time was negative for DVT 12/14; still complaining of pain which if this is arterial is certainly claudication and rest keeps him uncomfortable at night. He has an appointment with vein and vascular on December 22. Wound surface is  better using Iodoflex. Once the surface of this is satisfactory and we have exhausted the vascular route. Perhaps an advanced treatment option. He has a configuration of the venous ulceration although his arterial studies are not very good. The other issue is the patient has a transplanted kidney. This will make angiography difficult and challenging issue 12/21; still complaining of pain and drainage. We are using Iodoflex on the wound under compression. He sees Dr. Donnetta Hutching tomorrow to evaluate his noninvasive studies noted above. He has a transplanted kidney further complicating the options for angiography. 12/28; still using Iodoflex under compression. I have Dr. Luther Parody note from 12/22. he noted arterial studies revealing monophasic waveforms at the pedal vessels bilaterally and calcified vessels making the ABI unreliable. He did not comment on the reduced TBI's. He felt these were venous wounds based on the palpable dorsalis pedis pulse. 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 And they arranged for formal venous duplex with reflux studies in the next several weeks. Follow-up with either Scot Dock or Dr. Oneida Alar 09/24/2019 still using Iodoflex under compression. He has an appointment with Dr. Doren Custard on 1/7 with regards to his venous disease. The patient was not felt to have a primary arterial problem for the nonhealing of his wound. We did attempt to wrap him with 3 layer when he first came into the clinic he complained of a lot of pain in the ankle although this may have been because the dressing fell down somewhat. He has far too edema fluid in the right leg for a good prognosis about healing this wound He comes in today with an excoriation on the  bottom part of his right fifth toe. He thinks he may have done this taking off his clothes and that something got caught on the toe. There is no open wound however the toe itself is very painful Electronic Signature(s) Signed: 09/24/2019 4:56:38 PM  By: [name] MD Entered By: [name] on 09/24/2019 12:39:04 -------------------------------------------------------------------------------- Physical Exam Details Patient Name: Date of Service: [name], [name] 09/24/2019 10:30 AM Medical Record [id number] Patient Account Number: [id number] Date of Birth/Sex: Treating RN: [birth year] (74 y.o. M) Primary Care Provider: [name] Other Clinician: Referring Provider: Treating Provider/Extender:Robson, [name], [name] in Treatment: 7 Constitutional Patient is hypotensive.. Pulse regular and within target range for patient.Marland Kitchen Respirations regular, non-labored and within target range.. Temperature is normal and within the target range for the patient.Marland Kitchen Appears in no distress. Electronic Signature(s) Signed: 09/24/2019 4:56:38 PM By: [name] MD Entered By: [name] on 09/24/2019 12:38:22 -------------------------------------------------------------------------------- Physician Orders Details Patient Name: Date of Service: [name], [name] 09/24/2019 10:30 AM Medical Record [id number] Patient Account Number: [id number] Date of Birth/Sex: Treating RN: 10/28/1945 (73 y.o. [name] Primary Care Provider: [name] Other Clinician: Referring Provider: Treating Provider/Extender:Robson, [name], [name] in Treatment: 7 Verbal / Phone Orders: No Diagnosis Coding ICD-10 Coding Code Description S80.11XD Contusion of right lower leg, subsequent encounter L97.212 Non-pressure chronic ulcer of right calf with fat layer exposed I87.321 Chronic venous hypertension (idiopathic) with inflammation of right lower extremity E11.622 Type 2 diabetes mellitus with other skin ulcer Follow-up Appointments Return Appointment in 1 week. Dressing Change Frequency Wound #3 Right,Posterior Lower Leg Change dressing three times week. - 2x by home health on  Wednesdays and Fridays, 1x by wound clinic on Mondays Wound #5 Right Toe Fifth Change dressing every day. Skin Barriers/Peri-Wound Care Barrier cream Moisturizing lotion Wound Cleansing Clean wound with Normal Saline. - or normal saline on days that dressing is changed May shower with protection. Primary Wound Dressing Wound #3 Right,Posterior Lower Leg Iodoflex Wound #5 Right Toe Fifth Other: - dry gauze/kling for protection Secondary Dressing Wound #3 Right,Posterior Lower Leg Foam - foam top bend of foot for protection ABD pad Kerramax - or Zetuvit (or other super absorbent dressing i.e. Lauraine Rinne) Edema Control 3 Layer Compression System - Right Lower Extremity Avoid standing for long periods of time Elevate legs to the level of the heart or above for 30 minutes daily and/or when sitting, a frequency of: - throughout the day Exercise regularly [facility] skilled nursing for wound care. - [facility] X-ray, right foot with emphasis on fifth toe - Redness, pain in right fifth toe - (ICD10 E11.622 - Type 2 diabetes mellitus with other skin ulcer) Electronic Signature(s) Signed: 09/24/2019 4:56:38 PM By: [name] MD Signed: 09/24/2019 5:01:35 PM By: [name] RN, BSN Entered By: [name] on 09/24/2019 11:11:22 -------------------------------------------------------------------------------- Prescription 09/24/2019 Patient Name: [name] Provider: [name] MD Date of Birth: Jan 23, 1946 NPI#: [id number] Sex: M DEA#: [id number] Phone #: [phone] License #: [id number] Patient Address: [facility] [address] [place], [place] [address] [place], [place] [address] [phone] Allergies Elavil Reaction: hallucination Severity: Moderate Provider's Orders X-ray, right foot with emphasis on fifth toe - ICD10: E11.622 - Redness, pain in right fifth  toe Signature(s): Date(s): Electronic Signature(s) Signed: 09/24/2019 4:56:38 PM By: [name] MD Signed: 09/24/2019 5:01:35 PM By: [name] RN, BSN Entered By: [name] on 09/24/2019 11:11:23 --------------------------------------------------------------------------------  Problem List Details Patient Name: Date of Service: [name], [name] 09/24/2019 10:30 AM Medical Record [id number] Patient Account Number: [id number] Date of Birth/Sex: Treating RN: 1946-02-20 (74 y.o. Jonette Eva, Briant Cedar Primary Care Provider: [name] Other Clinician: Referring Provider: Treating Provider/Extender:Robson, [name], [name] in Treatment: 7 Active Problems ICD-10 Evaluated Encounter Code Description Active Date Today Diagnosis S80.11XD Contusion of right lower leg, subsequent encounter 08/06/2019 No Yes L97.212 Non-pressure chronic ulcer of right calf with fat layer 08/06/2019 No Yes exposed I87.321 Chronic venous hypertension (idiopathic) with 08/06/2019 No Yes inflammation of right lower extremity E11.622 Type 2 diabetes mellitus with other skin ulcer 08/06/2019 No Yes L97.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 M) Primary Care Provider: [name] Other Clinician: Referring Provider: Treating Provider/Extender:Robson, [name], [name] in Treatment: 7 Subjective History of Present Illness  (HPI) [place] HPI Description: 75 year old gentleman who was recently seen by his nephrologist [name], and noted to have a wound on his left lower extremity which was lacerated 2 months ago and now has reopened. The patient's left shin has a ulceration with some exudate but no evidence of infection and he was referred to [place] for further care as it was known that the patient has had some peripheral vascular disease in the past. Past medical history significant for chronic kidney disease, atrial fibrillation, diabetes mellitus,status post kidney transplant in 1983 and 2005, a week fistula graft placement, status post previous bowel surgery. 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 10/06/2015 -- x-ray of the left tibia and fibula shows no evidence of osteomyelitis. The patient has also had Doppler studies of his extremity and is awaiting the appointment with the vascular surgeon. We have not yet received these reports. 10/13/2015 -- lower extremity venous duplex reflux evaluation shows reflux in the left common femoral vein, left saphenofemoral junction and the proximal greater saphenous vein extending to the proximal calf. There is also reflux in the left proximal to mid small saphenous vein. Arterial duplex studies done showed the resting ABI was not applicable due to tibial artery medial calcification. 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 The right TBI was 1 which is normal and the left ABI was 1 which is normal. Patient has otherwise been doing fine and has been compliant with his dressings. 10/20/2015 -- He was seen by [name] recently for a vascular opinion on 10/15/2015. His left lower extremity venous insufficiency duplex study revealed GSV reflux,SS vein reflux and deep venous reflux in the common  femoral vein. His ABIs were non compressible and his TBI on the right was 1.01 and on the left was 0.80. He was asked to continue with the wound care with compressive therapy followed by EVLA of the left GS vein 3 months. He recommended 20-30 mm thigh-high compression stockings and the need for a three-month trial of this. The patient had an Unna boot applied at the vascular office but he could not tolerate this with  a lot of pain and issues with his toes and hence came here on Friday for removal of this and we reapplied a 2 layer compression. 11/10/2015 -- patient still has not purchased his 20-30 mm thigh-high compression stockings as prescribed by [name]. Readmission: 08/08/18 on evaluation today patient presents for readmission concerning a new injury to the left anterior lower extremity. He was previously seen in 2017 here in our clinic. He states that he has done fairly well since that point. Nonetheless he is having at this time some pain but states that he hit this on a table that fell over and actually struck his leg. This appears to have pulled back some of his skin which folded in on itself and is causing some difficulty as far as that is concerned. There does not appear to be any evidence of infection at this time. No fevers, chills, nausea, or vomiting noted at this time. He's been using dressings on his own currently without complication. 08/15/18 on evaluation today patient actually appears to be doing somewhat better in regard to his wound of the lower [name] when compared to the first visit last week. I had to do a much more extensive debridement at that time it does appear that I'm gonna have to perform some debridement today but it does not look to be as extensive by any means. Nonetheless fortunately he does not show any signs of infection he does have discomfort at this site. I believe based on what I'm seeing currently he may benefit from Iodoflex to help keep the wound  bed clean. Patient tolerated therapy without complication. Upon evaluation today the patient actually appears to be doing excellent in regard to his left lower extremity ulcer. This is much better than the previous two visits where he had a lot of necrotic tissue around the edge of the wound simply due to the fact that again there was a significant skin tear where the edge had been cleared away prior to reattaching and being able to heal appropriately. He seems to be doing much better at this point. 08/28/18 on evaluation today the patient's wound actually does appear to be showing signs of improvement. With that being said though he is improving he would likely note even greater improvement if we were able to sharply debride the wound. Nonetheless this caused him to much discomfort he tells me. 09/04/18 on evaluation today patient actually appears to be showing signs of improvement in regard to his left lower extremity ulcer. He has been tolerating the dressing changes including the wrap although he tells me at this point that the burning does last for a couple of days even with just the Iodoflex. I was afraid that this may been part of the issue that he was having with discomfort. It does seem to be the case. Nonetheless he shows no signs of evidence of infection at this time which is good news. No fevers chills noted ADMISSION to [name] 10/05/2018 This is a patient who was cared for in 2017 and again in the fall of this year at our sister clinic and [place]. He actually lives in [place] in [place]. We have been dealing with an apparent traumatic area on the left anterior tibial area. This is been present for the last several months. He was supposed to be using Iodoflex Kerlix and Coban however he was hospitalized from 09/05/2018 through 09/11/2018 with delirium secondary to pneumonia. Since then he is only been putting Vaseline gauze  on this without compression. He  also has a more recent skin tear on the dorsal right hand that may have only happened in the last week. The patient had arterial studies done in 2017 in January which was 3 years ago. At that point he had noncompressible ABIs but really quite good TBI's both normal. Triphasic waveforms on the right monophasic at the left posterior tibial but triphasic at the left dorsalis pedis. His ABIs in our clinic today were both noncompressible 1/23; the patient has wounds on the right dorsal hand just distal to the wrist and on the left anterior lower extremity. Both of these look very healthy he is using Hydrofera Blue 1/30; left anterior lower extremity wound much smaller. Healthy looking surface. The laceration area just distal to the wrist on the dorsal hand on the right is also just about closed I used Hydrofera Blue here 2/6; left anterior lower extremity wound is much smaller but still open. The laceration area just distal to the wrist on the dorsal hand is fully epithelialized. 2/13; the patient's anterior lower extremity wound is closed. The laceration just distal to the wrist on the dorsal hand is also fully epithelialized and closed. The patient has external compression stockings which I think are 20/30 READMISSION 08/06/2019 Mr. Russomanno is a 74 year old man with had several times previously in our clinic. He is a diabetic with a history of chronic renal insufficiency status post kidney transplant in 1983 and again in 2005. He was then in 2017 with a laceration on the left lower extremity. He was worked up at the time with arterial studies and reflux studies. The arterial studies showed ABIs to be noncompressible but TBI's were within normal limits. I do not have the reflux studies at the moment. He was also sent here in 2019 with a left lower extremity wound and then again in 2020 with left lower extremity trauma a skin tear on the wrist. He was discharged with 20/30 stockings identified from  myself that that might not be enough compression. Nevertheless he states he was wearing these fairly reliably. In September he had a fall with a substantial bruise in the area of the wound. He says he saw orthopedics and they told him there was some muscle strain sometime it later this opened into a wound. He has a fairly substantial wound on the right posterior calf. Satellite areas around this including medially and posteriorly. He has not worn his stockings since the injury Past medical history; includes chronic renal failure secondary to diabetes with kidney transplant x2, atrial fibrillation, heart failure with preserved ejection fraction, coronary artery disease. ABIs on the right in our clinic were once again noncompressible 08/13/2019 on evaluation today patient appears to be doing decently well with regard to his wound compared to last week's evaluation. Unfortunately he is still having a lot of discomfort at this point which is I think in some part due to the 3 layer compression wrap which is a little bit stronger I think for him. When he was here before we actually utilized a Kerlix and Coban wrap which he states seemed to got a little bit better. Nonetheless I think we can probably drop back to this in light of the discomfort that he had. Nonetheless the pain was not really right around the wound itself as much as it was around the ankle in particular. The Iodoflex does seem to have done well for him As the wound is appearing somewhat better today which is excellent  news. 11/30; still complaining of a lot of pain. Apparently arterial studies I ordered 2 weeks ago are below. I do not believe we have an appointment with vein and vascular as of yet; ABI Findings: +---------+------------------+-----+----------+--------+ Right Rt Pressure (mmHg)IndexWaveform Comment  +---------+------------------+-----+----------+--------+ PTA >254 1.50 monophasic   +---------+------------------+-----+----------+--------+ DP >254 1.50 monophasic  +---------+------------------+-----+----------+--------+ Great Toe68 0.40 Abnormal   +---------+------------------+-----+----------+--------+ +---------+------------------+-----+----------+-------+ Left Lt Pressure (mmHg)IndexWaveform Comment +---------+------------------+-----+----------+-------+ Brachial 169     +---------+------------------+-----+----------+-------+ PTA >254 1.50 monophasic  +---------+------------------+-----+----------+-------+ DP >254 1.50 monophasic  +---------+------------------+-----+----------+-------+ Great Toe40 0.24 Abnormal   +---------+------------------+-----+----------+-------+ Pedal arteries appear hyperemic. Patient refused Brachial pressure in the right arm. Summary: Right: Resting right ankle-brachial index indicates noncompressible right lower extremity arteries. The right toe- brachial index is abnormal. Left: Resting left ankle-brachial index indicates noncompressible left lower extremity arteries. The left toe-brachial index is abnormal. He is also having considerably more swelling in his left calf. This was not there when I last saw him 2 weeks ago. He tells me that some of the home health compression wraps have been slipping down and that may be the issue here however a month I am uncertain 85/0; sees vascular on December 22. Still complaining of a lot of pain. DVT study I did last time was negative for DVT 12/14; still complaining of pain which if this is arterial is certainly claudication and rest keeps him uncomfortable at night. He has an appointment with vein and vascular on December 22. Wound surface is better using Iodoflex. Once the surface of this is satisfactory and we have exhausted the vascular route. Perhaps an advanced treatment option. He has a configuration of the venous ulceration although his arterial  studies are not very good. The other issue is the patient has a transplanted kidney. This will make angiography difficult and challenging issue 12/21; still complaining of pain and drainage. We are using Iodoflex on the wound under compression. He sees Dr. Donnetta Hutching tomorrow to evaluate his noninvasive studies noted above. He has a transplanted kidney further complicating the options for angiography. 12/28; still using Iodoflex under compression. I have Dr. Luther Parody note from 12/22. he noted arterial studies revealing monophasic waveforms at the pedal vessels bilaterally and calcified vessels making the ABI unreliable. He did not comment on the reduced TBI's. He felt these were venous wounds based on the palpable dorsalis pedis pulse. 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 And they arranged for formal venous duplex with reflux studies in the next several weeks. Follow-up with either Scot Dock or Dr. Oneida Alar 09/24/2019 still using Iodoflex under compression. He has an appointment with Dr. Doren Custard on 1/7 with regards to his venous disease. The patient was not felt to have a primary arterial problem for the nonhealing of his wound. We did attempt to wrap him with 3 layer when he first came into the clinic he complained of a lot of pain in the ankle although this may have been because the dressing fell down somewhat. He has far too edema fluid in the right leg for a good prognosis about healing this wound He comes in today with an excoriation on the bottom part of his right fifth toe. He thinks he may have done this taking off his clothes and that something got caught on the toe. There is no open wound however the toe itself is very painful Objective Constitutional Patient is hypotensive.. Pulse regular and within target range for patient.Marland Kitchen Respirations regular, non-labored and within target range.. Temperature is normal and within the target range  for the patient.Marland Kitchen Appears in no distress. Vitals Time  Taken: 10:00 AM, Height: 67 in, Weight: 190 lbs, BMI: 29.8, Temperature: 98.2 F, Pulse: 60 bpm, Respiratory Rate: 20 breaths/min, Blood Pressure: 170/73 mmHg, Capillary Blood Glucose: 119 mg/dl. General Notes: last CBG was 119 per patient Integumentary (Hair, Skin) Wound #3 status is Open. Original cause of wound was Trauma. The wound is located on the Right,Posterior Lower Leg. The wound measures 15.6cm length x 5.5cm width x 0.3cm depth; 67.387cm^2 area and 20.216cm^3 volume. There is Fat Layer (Subcutaneous Tissue) Exposed exposed. There is no tunneling or undermining noted. There is a large amount of purulent drainage noted. Foul odor after cleansing was noted. The wound margin is well defined and not attached to the wound base. There is medium (34-66%) pink granulation within the wound bed. There is a medium (34-66%) amount of necrotic tissue within the wound bed including Adherent Slough. Wound #5 status is Open. Original cause of wound was Blister. The wound is located on the Right Toe Fifth. The wound measures 0.5cm length x 1.5cm width x 0.1cm depth; 0.589cm^2 area and 0.059cm^3 volume. There is Fat Layer (Subcutaneous Tissue) Exposed exposed. There is no tunneling or undermining noted. There is a small amount of serous drainage noted. The wound margin is distinct with the outline attached to the wound base. There is medium (34-66%) pink granulation within the wound bed. There is a medium (34-66%) amount of necrotic tissue within the wound bed including Eschar and Adherent Slough. 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 There was a Excisional Skin/Subcutaneous Tissue Debridement with a total area of 16 sq cm performed by Ricard Dillon., MD. With the following instrument(s): Curette to remove Viable and Non-Viable tissue/material. Material removed includes Subcutaneous Tissue and Slough and. No specimens were taken. A time out was conducted at 11:02, prior to the start of the procedure. A Minimum amount of bleeding was controlled with Pressure. The procedure was tolerated well with a pain level of 5 throughout and a pain level of 2 following the procedure. Post Debridement Measurements: 15.8cm length x 5.5cm width x 0.3cm depth; 20.475cm^3 volume. Character of Wound/Ulcer Post Debridement requires further debridement. Severity of Tissue Post Debridement is: Fat layer exposed. Post procedure Diagnosis Wound #3: Same as Pre-Procedure Pre-procedure diagnosis of Wound #3 is a Venous Leg Ulcer located on the Right,Posterior Lower Leg . There was a Three Layer Compression Therapy Procedure by [name], RN. Post procedure Diagnosis Wound #3: Same as Pre-Procedure Plan Follow-up Appointments: Return Appointment in 1 week. Dressing Change Frequency: Wound #3 Right,Posterior Lower Leg: Change dressing three times week. - 2x by home health on Wednesdays and Fridays, 1x by wound clinic on Mondays Wound #5 Right Toe Fifth: Change dressing every day. Skin Barriers/Peri-Wound Care: Barrier cream Moisturizing lotion Wound Cleansing: Clean wound with Normal Saline. - or normal saline on days that dressing is changed May shower with protection. Primary Wound Dressing: Wound #3 Right,Posterior Lower Leg: Iodoflex Wound #5 Right Toe Fifth: Other: - dry gauze/kling for protection Secondary Dressing: Wound #3 Right,Posterior Lower  Leg: Foam - foam top bend of foot for  protection ABD pad Kerramax - or Zetuvit (or other super absorbent dressing i.e. Lauraine Rinne) Edema Control: 3 Layer Compression System - Right Lower Extremity Avoid standing for long periods of time Elevate legs to the level of the heart or above for 30 minutes daily and/or when sitting, a frequency of: - throughout the day Exercise regularly Home Health: [facility] for wound care. - [facility] ordered were: X-ray, right foot with emphasis on fifth toe - Redness, pain in right fifth toe 1. Continue with the Iodoflex to the large wound on the right posterior calf 2. Continued mechanical debridement of the tightly adherent necrotic debris on the lower 40% of this wound 3. Run Apligraf x2 4. After some consideration I have increased the amount of compression back to 3 layers. Hopefully he will be able to tolerate this as he has simply has too much fluid in the right calf the easily he will this wound. He was not felt to have an arterial issue by vein and vascular 5. I am not sure what he is done to his right fifth toe. There is no open area here although there did appear to be some subcutaneous hemorrhage. I will get an x-ray done here and simply wrapped this for now Electronic Signature(s) Signed: 09/24/2019 4:56:38 PM By: [name] MD Entered By: [name] on 09/24/2019 12:41:06 -------------------------------------------------------------------------------- SuperBill Details Patient Name: Date of Service: [name], [name] 09/24/2019 Medical Record [id number] Patient Account Number: [id number] Date of Birth/Sex: Treating RN: June 28, 1946 (73 y.o. M) Primary Care Provider: [name] Other Clinician: Referring Provider: Treating Provider/Extender:Robson, [name], [name] in Treatment: 7 Diagnosis Coding ICD-10 Codes Code Description S80.11XD Contusion of right lower leg, subsequent encounter L97.212  Non-pressure chronic ulcer of right calf with fat layer exposed I87.321 Chronic venous hypertension (idiopathic) with inflammation of right lower extremity E11.622 Type 2 diabetes mellitus with other skin ulcer L97.514 Non-pressure chronic ulcer of other part of right foot with necrosis of bone Facility Procedures CPT4 Code: [id number] Description: 10175 - DEB SUBQ TISSUE 20 SQ CM/< ICD-10 Diagnosis Description L97.212 Non-pressure chronic ulcer of right calf with fat layer Modifier: exposed Quantity: 1 Physician Procedures CPT4 Code Description: [id number] 11042 - WC PHYS SUBQ TISS 20 SQ CM ICD-10 Diagnosis Description L97.212 Non-pressure chronic ulcer of right calf with fat layer Modifier: exposed Quantity: 1 Electronic Signature(s) Signed: 09/24/2019 4:56:38 PM By: [name] MD Entered By: [name] on 09/24/2019 12:41:17

## 2019-09-24 NOTE — Progress Notes (Addendum)
Michael, [name] ([id number]) Visit Report for 09/24/2019 Arrival Information Details Patient Name: Date of Service: [name], [name] 09/24/2019 10:30 AM Medical Record [id number] Patient Account Number: [id number] Date of Birth/Sex: Treating RN: September 17, 1946 (74 y.o. [name] Primary Care [name]: [name] Other Clinician: Referring [name]: Treating [name]:[name] in Treatment: 7 Visit Information History Since Last Visit Added or deleted any medications: No Patient Arrived: Ambulatory Any new allergies or adverse reactions: No Arrival Time: 09:59 Had a fall or experienced change in No Accompanied By: self activities of daily living that may affect Transfer Assistance: None risk of falls: Patient Has Alerts: Yes Signs or symptoms of abuse/neglect since last No Patient Alerts: non compressable visito Hospitalized since last visit: No Implantable device outside of the clinic excluding No cellular tissue based products placed in the center since last visit: Has Dressing in Place as Prescribed: Yes Has Compression in Place as Prescribed: Yes Pain Present Now: Yes Electronic Signature(s) Signed: 09/24/2019 4:32:10 PM By: Kela Millin Entered By: Kela Millin on 09/24/2019 10:07:35 -------------------------------------------------------------------------------- Compression Therapy Details Patient Name: Date of Service: [name], [name] 09/24/2019 10:30 AM Medical Record [id number] Patient Account Number: [id number] Date of Birth/Sex: Treating RN: 10-01-1945 (73 y.o. [name] Primary Care Zaccai Chavarin: [name] Other Clinician: Referring Aariona Momon: Treating [name]/Extender:[name] in Treatment: 7 Compression Therapy Performed for Wound Wound #3 Right,Posterior Lower Leg Assessment: Performed By: Clinician Michael Hurst,  RN Compression Type: Three Layer Post Procedure Diagnosis Same as Pre-procedure Electronic Signature(s) Signed: 09/24/2019 5:01:35 PM By: Michael Hurst RN, BSN Entered By: [name] on 09/24/2019 11:12:02 -------------------------------------------------------------------------------- Encounter Discharge Information Details Patient Name: Date of Service: [name], [name] 09/24/2019 10:30 AM Medical Record [id number] Patient Account Number: [id number] Date of Birth/Sex: Treating RN: January 07, 1946 (74 y.o. [name] Primary Care Tyeasha Ebbs: [name] Other Clinician: Referring Marcellino Fidalgo: Treating Avantika Shere/Extender:[name] in Treatment: 7 Encounter Discharge Information Items Post Procedure Vitals Discharge Condition: Stable Temperature (F): 98.2 Ambulatory Status: Ambulatory Pulse (bpm): 60 Discharge Destination: Home Respiratory Rate (breaths/min): 20 Transportation: Private Auto Blood Pressure (mmHg): 170/73 Accompanied By: self Schedule Follow-up Appointment: Yes Clinical Summary of Care: Electronic Signature(s) Signed: 09/24/2019 4:44:32 PM By: [name] Entered By: [name] on 09/24/2019 11:28:19 -------------------------------------------------------------------------------- Lower Extremity Assessment Details Patient Name: Date of Service: [name], [name] 09/24/2019 10:30 AM Medical Record [id number] Patient Account Number: [id number] Date of Birth/Sex: Treating RN: 1946-05-30 (74 y.o. [name] Primary Care Tarah Buboltz: [name] Other Clinician: Referring Devika Dragovich: Treating [name]/Extender:[name] in Treatment: 7 Edema Assessment Assessed: [Left: No] [Right: No] Edema: [Left: Ye] [Right: s] Calf Left: Right: Point of Measurement: 30 cm From Medial Instep cm 43.5 cm Ankle Left: Right: Point of Measurement: 10 cm From Medial Instep cm 24.5  cm Vascular Assessment Pulses: Dorsalis Pedis Palpable: [Right:Yes] Electronic Signature(s) Signed: 09/24/2019 4:32:10 PM By: Kela Millin Entered By: Kela Millin on 09/24/2019 10:13:22 -------------------------------------------------------------------------------- Multi Wound Chart Details Patient Name: Date of Service: [name], [name] 09/24/2019 10:30 AM Medical Record [id number] Patient Account Number: [id number] Date of Birth/Sex: Treating RN: 05-04-1946 (74 y.o. 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] Comorbid History: [3:Cataracts, Anemia, Arrhythmia, Congestive Heart Failure, Hypertension, Heart Failure, Hypertension, Type II Diabetes, Gout, Neuropathy] [5:Cataracts, Anemia, Arrhythmia, Congestive Type II Diabetes, Gout, Neuropathy] [N/A:N/A] Date Acquired: [3:06/06/2019] [5:09/19/2019] [N/A:N/A] Weeks of Treatment: [3:7] [5:0] [N/A:N/A] Wound Status: [3:Open] [5:Open] [N/A:N/A] Measurements L x W x D 15.6x5.5x0.3 [5:0.5x1.5x0.1] [N/A:N/A] (cm) Area (cm) : [3:67.387] [5:0.589] [N/A:N/A] Volume (cm) : [3:20.216] [5:0.059] [N/A:N/A] % Reduction in Area: [3:-199.00%] [5:N/A] [N/A:N/A] % Reduction in Volume: -199.00% [5:N/A] [N/A:N/A] Classification: [3:Partial Thickness] [5:Grade 2] [N/A:N/A] Exudate Amount: [3:Large] [5:Small] [N/A:N/A] Exudate Type: [3:Purulent]  [5:Serous] [N/A:N/A] Exudate Color: [3:yellow, brown, green] [5:amber] [N/A:N/A] Foul Odor After Cleansing:Yes [5:No] [N/A:N/A] Odor Anticipated Due to No [5:N/A] [N/A:N/A] Product Use: Wound Margin: [3:Well defined, not attached] [5:Distinct, outline attached] [N/A:N/A] Granulation Amount: [3:Medium (34-66%)] [5:Medium (34-66%)] [N/A:N/A] Granulation Quality: [3:Pink] [5:Pink] [N/A:N/A] Necrotic Amount: [3:Medium (34-66%)] [5:Medium (34-66%)] [N/A:N/A] Necrotic Tissue: [3:Adherent Slough] [5:Eschar, Adherent Slough] [N/A:N/A] Exposed Structures: [3:Fat Layer (Subcutaneous Tissue) Exposed: Yes Fascia: No Tendon: No Muscle: No Joint: No Bone: No] [5:Fat Layer (Subcutaneous Tissue) Exposed: Yes Fascia: No Tendon: No Muscle: No Joint: No Bone: No] [N/A:N/A] Epithelialization: [3:None] [5:None] [N/A:N/A] Debridement: [3:Debridement - Excisional] [5:N/A] [N/A:N/A] Pre-procedure [3:11:02] [5:N/A] [N/A:N/A] Verification/Time Out Taken: Tissue Debrided: [3:Subcutaneous, Slough] [5:N/A] [N/A:N/A] Level: [3:Skin/Subcutaneous Tissue] [5:N/A] [N/A:N/A] Debridement Area (sq cm):16 [5:N/A] [N/A:N/A] Instrument: [3:Curette] [5:N/A] [N/A:N/A] Bleeding: [3:Minimum] [5:N/A] [N/A:N/A] Hemostasis Achieved: [3:Pressure] [5:N/A] [N/A:N/A] Procedural Pain: [3:5] [5:N/A] [N/A:N/A] Post Procedural Pain: [3:2] [5:N/A] [N/A:N/A] Debridement Treatment Procedure was tolerated [5:N/A] [N/A:N/A] Response: [3:well] Post Debridement [3:15.8x5.5x0.3] [5:N/A] [N/A:N/A] Measurements L x W x D (cm) Post Debridement [3:20.475] [5:N/A] [N/A:N/A] Volume: (cm) Procedures Performed: Compression Therapy [3:Debridement] [5:N/A] [N/A:N/A] Treatment Notes Wound #3 (Right, Posterior Lower Leg) 1. Cleanse With Wound Cleanser Soap and water 2. Periwound Care Barrier cream Moisturizing lotion 3. Primary Dressing Applied Iodoflex 4. Secondary Dressing ABD Pad Dry Gauze Kerramax/Xtrasorb 6. Support Layer Applied 3  layer compression wrap Notes secondary dressing applied carboflex. netting. Wound #5 (Right Toe Fifth) 1. Cleanse With Wound Cleanser 3. Primary Dressing Applied Other primary dressing (specifiy in notes) Notes conform as primary, explained to patient to change daily. Electronic Signature(s) Signed: 09/24/2019 4:56:38 PM By: [name] MD Entered By: [name] on 09/24/2019 12:33:51 -------------------------------------------------------------------------------- Multi-Disciplinary Care Plan Details Patient Name: Date of Service: [name], [name] 09/24/2019 10:30 AM Medical Record [id number] Patient Account Number: [id number] Date of Birth/Sex: Treating RN: 07/26/1946 (73 y.o. [name] Primary Care Jalayna Josten: [name] Other Clinician: Referring Shaquisha Wynn: Treating Shamaya Kauer/Extender:[name] in Treatment: 7 Active Inactive Nutrition Nursing Diagnoses: Impaired glucose control: actual or potential Potential for alteratiion in Nutrition/Potential for imbalanced nutrition Goals: Patient/caregiver agrees to and verbalizes understanding of need to use nutritional supplements and/or vitamins as prescribed Target Resolution Date Initiated: 08/06/2019 Date Inactivated: 09/03/2019 Date: 09/07/2019 Goal Status: Met Patient/caregiver will maintain therapeutic glucose control Date Initiated: 08/06/2019 Target Resolution Date: 10/05/2019 Goal Status: Active Interventions: Assess HgA1c results as ordered upon admission and as needed Assess patient nutrition upon admission and as needed per policy Provide education on elevated blood sugars and impact on wound healing Provide education on nutrition Treatment Activities: Education provided on Nutrition : 09/17/2019 Notes: Venous Leg Ulcer Nursing Diagnoses: Knowledge deficit related to disease process and management Goals: Patient will maintain optimal edema  control Date Initiated: 08/06/2019 Target Resolution Date: 10/05/2019 Goal Status: Active Patient/caregiver will verbalize understanding of disease  process and disease management Target Resolution Date Initiated: 08/06/2019 Date Inactivated: 09/03/2019 Date: 09/07/2019 Goal Status: Met Interventions: Assess peripheral edema status every visit. Compression as ordered Provide education on venous insufficiency Notes: Wound/Skin Impairment Nursing Diagnoses: Impaired tissue integrity Goals: Patient/caregiver will verbalize understanding of skin care regimen Date Initiated: 08/06/2019 Target Resolution Date: 10/05/2019 Goal Status: Active Ulcer/skin breakdown will have a volume reduction of 30% by week 4 Date Initiated: 08/06/2019 Date Inactivated: 09/03/2019 Target Resolution Date: 09/07/2019 Unmet Reason: PAD, Goal Status: Unmet necrotic surface Interventions: Assess patient/caregiver ability to obtain necessary supplies Assess patient/caregiver ability to perform ulcer/skin care regimen upon admission and as needed Assess ulceration(s) every visit Provide education on ulcer and skin care Notes: Electronic Signature(s) Signed: 09/24/2019 5:01:35 PM By: Michael Hurst RN, BSN Entered By: [name] on 09/24/2019 15:23:34 -------------------------------------------------------------------------------- Pain Assessment Details Patient Name: Date of Service: [name], [name] 09/24/2019 10:30 AM Medical Record [id number] Patient Account Number: [id number] Date of Birth/Sex: Treating RN: 01-31-1946 (74 y.o. [name] Primary Care Braedin Millhouse: [name] Other Clinician: Referring Aeliana Spates: Treating Martesha Niedermeier/Extender:[name] in Treatment: 7 Active Problems Location of Pain Severity and Description of Pain Patient Has Paino Yes Site Locations Pain Location: Pain in Ulcers With Dressing Change: Yes Duration of the  Pain. Constant / Intermittento Constant Rate the pain. Current Pain Level: 4 Worst Pain Level: 7 Least Pain Level: 2 Tolerable Pain Level: 5 Character of Pain Describe the Pain: Burning, Stabbing, Throbbing Pain Management and Medication Current Pain Management: Electronic Signature(s) Signed: 09/24/2019 4:32:10 PM By: Kela Millin Entered By: Kela Millin on 09/24/2019 10:09:11 -------------------------------------------------------------------------------- Patient/Caregiver Education Details Patient Name: Date of Service: [name], [name] 1/4/2021andnbsp10:30 AM Medical Record [id number] Patient Account Number: [id number] Date of Birth/Gender: Treating RN: 08/13/1946 (74 y.o. [name] Primary Care Physician: [name] Other Clinician: Referring Physician: Treating Physician/Extender:[name] in Treatment: 7 Education Assessment Education Provided To: Patient Education Topics Provided Wound/Skin Impairment: Methods: Explain/Verbal Responses: State content correctly Electronic Signature(s) Signed: 09/24/2019 5:01:35 PM By: Michael Hurst RN, BSN Entered By: [name] on 09/24/2019 15:23:47 -------------------------------------------------------------------------------- Wound Assessment Details Patient Name: Date of Service: [name], [name] 09/24/2019 10:30 AM Medical Record [id number] Patient Account Number: [id number] Date of Birth/Sex: Treating RN: 06-18-1946 (73 y.o. [name] Primary Care Shalisa Mcquade: [name] Other Clinician: Referring Orie Cuttino: Treating Charley Lafrance/Extender:[name] in Treatment: 7 Wound Status Wound Number: 3 Primary Venous Leg Ulcer Etiology: Wound Location: Right Lower Leg - Posterior Wound Open Wounding Event: Trauma Status: Date Acquired: 06/06/2019 Comorbid Cataracts, Anemia, Arrhythmia, Congestive Weeks  Of Treatment: 7 History: Heart Failure, Hypertension, Type II Diabetes, Clustered Wound: No Gout, Neuropathy Photos Wound Measurements Length: (cm) 15.6 Width: (cm) 5.5 Depth: (cm) 0.3 Area: (cm) 67.387 Volume: (cm) 20.216 Wound Description Classification: Partial Thickness Wound Margin: Well defined, not attached Exudate Amount: Large Exudate Type: Purulent Exudate Color: yellow, brown, green Wound Bed Granulation Amount: Medium (34-66%) Granulation Quality: Pink Necrotic Amount: Medium (34-66%) Necrotic Quality: Adherent Slough Foul Odor After Cleansing: Y Due to Product Use: No Slough/Fibrino Yes Exposed Structure Fascia Exposed: N Fat Layer (Subcutaneous Tissue) Exposed: Y Tendon Exposed: N Muscle Exposed: N Joint Exposed: N Bone Exposed: N % Reduction in Area: -199% % Reduction in Volume: -199% Epithelialization: None Tunneling: No Undermining: No es o es o o o o Treatment Notes Wound #3 (Right, Posterior Lower Leg) 1. Cleanse With Wound Cleanser Soap and water 2. Periwound Care Barrier cream Moisturizing  lotion 3. Primary Dressing Applied Iodoflex 4. Secondary Dressing ABD Pad Dry Gauze Kerramax/Xtrasorb 6. Support Layer Applied 3 layer compression wrap Notes secondary dressing applied carboflex. netting. Electronic Signature(s) Signed: 09/25/2019 3:29:37 PM By: Mikeal Hawthorne EMT/HBOT Signed: 09/25/2019 5:45:44 PM By: Kela Millin Previous Signature: 09/24/2019 4:32:10 PM Version By: Kela Millin Entered By: Mikeal Hawthorne on 09/25/2019 11:33:01 -------------------------------------------------------------------------------- Wound Assessment Details Patient Name: Date of Service: [name], [name] 09/24/2019 10:30 AM Medical Record [id number] Patient Account Number: [id number] Date of Birth/Sex: Treating RN: Aug 23, 1946 (73 y.o. [name] Primary Care Llewellyn Schoenberger: [name] Other Clinician: Referring  Ciera Beckum: Treating Amala Petion/Extender:[name] in Treatment: 7 Wound Status Wound Number: 5 Primary Diabetic Wound/Ulcer of the Lower Extremity Etiology: Wound Location: Right Toe Fifth Wound Open Wound Open Wounding Event: Blister Status: Date Acquired: 09/19/2019 Comorbid Cataracts, Anemia, Arrhythmia, Congestive Weeks Of Treatment: 0 History: Heart Failure, Hypertension, Type II Diabetes, Clustered Wound: No Gout, Neuropathy Wound Measurements Length: (cm) 0.5 % Red Width: (cm) 1.5 % Red Depth: (cm) 0.1 Epith Area: (cm) 0.589 Tunn Volume: (cm) 0.059 Unde Wound Description Classification: Grade 2 Wound Margin: Distinct, outline attached Exudate Amount: Small Exudate Type: Serous Exudate Color: amber Wound Bed Granulation Amount: Medium (34-66%) Granulation Quality: Pink Necrotic Amount: Medium (34-66%) Necrotic Quality: Eschar, Adherent Slough Foul Odor After Cleansing: No Slough/Fibrino Yes Exposed Structure Fascia Exposed: No Fat Layer (Subcutaneous Tissue) Exposed: Yes Tendon Exposed: No Muscle Exposed: No Joint Exposed: No Bone Exposed: No uction in Area: uction in Volume: elialization: None eling: No rmining: No Treatment Notes Wound #5 (Right Toe Fifth) 1. Cleanse With Wound Cleanser 3. Primary Dressing Applied Other primary dressing (specifiy in notes) Notes conform as primary, explained to patient to change daily. Electronic Signature(s) Signed: 09/24/2019 4:32:10 PM By: Kela Millin Entered By: Kela Millin on 09/24/2019 10:21:27 -------------------------------------------------------------------------------- Vitals Details Patient Name: Date of Service: [name], [name] 09/24/2019 10:30 AM Medical Record [id number] Patient Account Number: [id number] Date of Birth/Sex: Treating RN: 12/27/1945 (73 y.o. [name] Primary Care Earlean Fidalgo: [name] Other Clinician: Referring  Daquavion Catala: Treating Tayler Lassen/Extender:[name] in Treatment: 7 Vital Signs Time Taken: 10:00 Temperature (F): 98.2 Height (in): 67 Pulse (bpm): 60 Weight (lbs): 190 Respiratory Rate (breaths/min): 20 Body Mass Index (BMI): 29.8 Blood Pressure (mmHg): 170/73 Capillary Blood Glucose (mg/dl): 119 Reference Range: 80 - 120 mg / dl Notes last CBG was 119 per patient Electronic Signature(s) Signed: 09/24/2019 4:32:10 PM By: Kela Millin Entered By: Kela Millin on 09/24/2019 10:08:26

## 2019-09-26 ENCOUNTER — Other Ambulatory Visit: Payer: Self-pay | Admitting: Internal Medicine

## 2019-09-26 ENCOUNTER — Ambulatory Visit
Admission: RE | Admit: 2019-09-26 | Discharge: 2019-09-26 | Disposition: A | Discharge status: Home / Self Care | Payer: Medicare Other | Source: Ambulatory Visit | Attending: Internal Medicine | Admitting: Internal Medicine

## 2019-09-26 DIAGNOSIS — R52 Pain, unspecified: Secondary | ICD-10-CM

## 2019-09-26 DIAGNOSIS — L539 Erythematous condition, unspecified: Secondary | ICD-10-CM

## 2019-09-27 ENCOUNTER — Other Ambulatory Visit: Payer: Self-pay

## 2019-09-27 ENCOUNTER — Other Ambulatory Visit: Payer: Self-pay | Admitting: *Deleted

## 2019-09-27 ENCOUNTER — Ambulatory Visit (INDEPENDENT_AMBULATORY_CARE_PROVIDER_SITE_OTHER): Payer: Medicare Other | Admitting: Vascular Surgery

## 2019-09-27 ENCOUNTER — Encounter: Payer: Self-pay | Admitting: Vascular Surgery

## 2019-09-27 ENCOUNTER — Ambulatory Visit (HOSPITAL_COMMUNITY)
Admission: RE | Admit: 2019-09-27 | Discharge: 2019-09-27 | Disposition: A | Discharge status: Home / Self Care | Payer: Medicare Other | Source: Ambulatory Visit | Attending: Vascular Surgery | Admitting: Vascular Surgery

## 2019-09-27 VITALS — BP 164/75 | HR 52 | Temp 97.3°F | Resp 16 | Ht 68.0 in | Wt 193.0 lb

## 2019-09-27 DIAGNOSIS — I83012 Varicose veins of right lower extremity with ulcer of calf: Secondary | ICD-10-CM

## 2019-09-27 DIAGNOSIS — L97919 Non-pressure chronic ulcer of unspecified part of right lower leg with unspecified severity: Secondary | ICD-10-CM | POA: Diagnosis present

## 2019-09-27 DIAGNOSIS — I83019 Varicose veins of right lower extremity with ulcer of unspecified site: Secondary | ICD-10-CM | POA: Diagnosis present

## 2019-09-27 DIAGNOSIS — L97219 Non-pressure chronic ulcer of right calf with unspecified severity: Secondary | ICD-10-CM

## 2019-09-27 DIAGNOSIS — I872 Venous insufficiency (chronic) (peripheral): Secondary | ICD-10-CM

## 2019-09-27 NOTE — Progress Notes (Signed)
Patient name: [name] MRN: [id number] DOB: 07-Mar-1946 Sex: male  REASON FOR VISIT:   Follow-up of venous stasis ulcer  HPI:   [name] is a pleasant 74 y.o. male who was seen by Dr. Curt Jews on 09/11/2019.  He has previously had laser ablation of the left great saphenous vein in 2017.  He had an open venous ulcer on the right leg in the posterior calf.  On exam he had a palpable dorsalis pedis pulse.  Dr. Donnetta Hutching looked at the right great saphenous vein with the SonoSite and felt that this was significantly enlarged with reflux.  He felt that he had adequate circulation to heal the wound given that he had a palpable dorsalis pedis pulse.  With his severe venous hypertension it was felt that he might benefit from laser ablation of the right great saphenous vein.  He was scheduled for a follow-up visit.  Patient has a history of end-stage renal disease.  He has a functioning renal transplant and is currently not on dialysis.  The patient is having his extensive venous stasis ulcer on the posterior right calf treated at the wound care center.  He has been elevating his legs and has been using a compression dressing.  I did review his most recent note on 09/24/2019.  He has had noninvasive studies which showed a toe pressure on the right of 68 mmHg which would suggest adequate circulation for healing.  The patient describes significant aching pain and heaviness in the leg which is aggravated by standing and relieved with elevation.  Past Medical History:  Diagnosis Date  . Anemia   . Atrial fibrillation ([place])   . Chronic kidney disease   . Diabetes mellitus   . Dysrhythmia   . Hyperlipidemia   . Neuropathy   . Wound of left leg 08/2018    Family History  Problem Relation Age of Onset  . Heart disease Mother   . Heart attack Father   . Stroke Sister     SOCIAL HISTORY: Social History   Tobacco Use  . Smoking status: Never Smoker  . Smokeless tobacco: Never Used    Substance Use Topics  . Alcohol use: No    Alcohol/week: 0.0 standard drinks    Allergies  Allergen Reactions  . Elavil [Amitriptyline Hcl] Other (See Comments)    Unknown felt as if he was tripping    Current Outpatient Medications  Medication Sig Dispense Refill  . acetaminophen (TYLENOL) 500 MG tablet Take 1,000 mg by mouth every 8 (eight) hours as needed for moderate pain.     . Alcohol Swabs (B-D SINGLE USE SWABS REGULAR) PADS Use as directed    . aspirin 81 MG tablet Take 81 mg by mouth daily.      Marland Kitchen atorvastatin (LIPITOR) 10 MG tablet Take 10 mg by mouth daily.    Marland Kitchen BAYER CONTOUR NEXT TEST test strip Use as directed    . BD PEN NEEDLE NANO U/F 32G X 4 MM MISC Use as directed    . calcitRIOL (ROCALTROL) 0.25 MCG capsule Take 0.25 mcg by mouth daily. daily    . carvedilol (COREG) 6.25 MG tablet TAKE 1 TABLET TWICE A DAY WITH MEALS (PLEASE CALL [phone] TO SET UP YEARLY APPOINTMENT WITH DR Tamala Julian FOR FURTHER REFILLS. THANK YOU) 180 tablet 3  . diphenhydrAMINE (BENADRYL) 25 mg capsule Take 1 capsule (25 mg total) by mouth at bedtime as needed (for sleep/allergies.). 30 capsule 0  . furosemide (LASIX) 40 MG  tablet Take 80 mg by mouth daily. Fluid. 30 tablet   . gabapentin (NEURONTIN) 100 MG capsule Take 100 mg by mouth 3 (three) times daily as needed (for nerve pain). Take three times/day as needed    . HUMALOG KWIKPEN 100 UNIT/ML KwikPen Inject 3-5 Units into the skin 3 (three) times daily.  6  . hydrALAZINE (APRESOLINE) 25 MG tablet Take 25 mg by mouth 3 (three) times daily.   4  . LANTUS SOLOSTAR 100 UNIT/ML Solostar Pen Inject 7-15 Units into the skin daily. If blood sugars are >300, use 15 units, <300 use 7 units    . latanoprost (XALATAN) 0.005 % ophthalmic solution Place 1 drop into both eyes at bedtime.    Marland Kitchen linagliptin (TRADJENTA) 5 MG TABS tablet Take 5 mg by mouth daily.      Marland Kitchen MICROLET LANCETS MISC Use as directed    . mycophenolate (CELLCEPT) 500 MG tablet Take 500  mg by mouth 2 (two) times daily.     . predniSONE (DELTASONE) 5 MG tablet Take 5 mg by mouth daily.  2  . tacrolimus (PROGRAF) 1 MG capsule Take 1 mg by mouth 2 (two) times daily.     Marland Kitchen VASCEPA 1 g CAPS Take 4 g by mouth daily.     No current facility-administered medications for this visit.    REVIEW OF SYSTEMS:  [X]  denotes positive finding, [ ]  denotes negative finding Cardiac  Comments:  Chest pain or chest pressure:    Shortness of breath upon exertion:    Short of breath when lying flat:    Irregular heart rhythm: x       Vascular    Pain in calf, thigh, or hip brought on by ambulation:    Pain in feet at night that wakes you up from your sleep:     Blood clot in your veins:    Leg swelling:  x       Pulmonary    Oxygen at home:    Productive cough:     Wheezing:         Neurologic    Sudden weakness in arms or legs:     Sudden numbness in arms or legs:     Sudden onset of difficulty speaking or slurred speech:    Temporary loss of vision in one eye:     Problems with dizziness:         Gastrointestinal    Blood in stool:     Vomited blood:         Genitourinary    Burning when urinating:     Blood in urine:        Psychiatric    Major depression:         Hematologic    Bleeding problems:    Problems with blood clotting too easily:        Skin    Rashes or ulcers:        Constitutional    Fever or chills:     PHYSICAL EXAM:   Vitals:   09/27/19 1101  Resp: 16  Weight: 193 lb (87.5 kg)  Height: 5\' 8"  (1.727 m)    GENERAL: The patient is a well-nourished male, in no acute distress. The vital signs are documented above. CARDIAC: There is a regular rate and rhythm.  VASCULAR: I do not detect carotid bruits. I cannot palpate pedal pulses however with the Doppler there is a biphasic dorsalis pedis and posterior tibial signal. He  has hyperpigmentation bilaterally consistent with chronic venous insufficiency. He has an extensive venous ulcer which  measures approximately 18 cm in length by 4 cm in width.   I looked at the right great saphenous vein myself with the SonoSite.  It dilates up to 0.7 cm in the mid thigh.  PULMONARY: There is good air exchange bilaterally without wheezing or rales. ABDOMEN: Soft and non-tender with normal pitched bowel sounds.  MUSCULOSKELETAL: There are no major deformities or cyanosis. NEUROLOGIC: No focal weakness or paresthesias are detected. SKIN: There are no ulcers or rashes noted. PSYCHIATRIC: The patient has a normal affect.  DATA:    VENOUS DUPLEX: I have independently interpreted his venous duplex scan today.   There is no evidence of DVT in the right lower extremity.  The small saphenous vein has chronic thrombus. There is deep venous reflux involving the common femoral vein.  There is superficial venous reflux in the great saphenous vein from the saphenofemoral junction to the knee.  MEDICAL ISSUES:   CHRONIC VENOUS INSUFFICIENCY: This patient has CEAP C6 venous disease.  Based on his previous assessments he appears to have adequate circulation from an arterial standpoint.  I would agree with Dr. Donnetta Hutching that he is a good candidate for laser ablation of the right great saphenous vein in order to lower his venous pressure.  I think this should significantly help with wound healing.  We have also discussed the importance of intermittent leg elevation and the proper positioning for this.  I have discussed the indications for endovenous laser ablation of the right GSV, that is to lower the pressure in the veins and potentially help relieve the symptoms from venous hypertension. I have also discussed alternative options including conservative treatment with leg elevation, compression therapy, exercise, avoiding prolonged sitting and standing, and weight management. I have discussed the potential complications of the procedure, including, but not limited to: bleeding, bruising, leg swelling, nerve injury,  skin burns, significant pain from phlebitis, deep venous thrombosis, or failure of the vein to close.  I have also explained that venous insufficiency is a chronic disease, and that the patient is at risk for recurrent varicose veins in the future.  All of the patient's questions were encouraged and answered. They are agreeable to proceed.   Once his wound has healed we will need to get him in a compression stocking to prevent recurrence.    [name] [facility] [phone]

## 2019-10-01 ENCOUNTER — Encounter (HOSPITAL_BASED_OUTPATIENT_CLINIC_OR_DEPARTMENT_OTHER): Payer: Medicare Other | Attending: Internal Medicine | Admitting: Internal Medicine

## 2019-10-01 ENCOUNTER — Other Ambulatory Visit: Payer: Self-pay

## 2019-10-01 DIAGNOSIS — Z940 Kidney transplant status: Secondary | ICD-10-CM | POA: Insufficient documentation

## 2019-10-01 DIAGNOSIS — E1140 Type 2 diabetes mellitus with diabetic neuropathy, unspecified: Secondary | ICD-10-CM | POA: Diagnosis not present

## 2019-10-01 DIAGNOSIS — L97212 Non-pressure chronic ulcer of right calf with fat layer exposed: Secondary | ICD-10-CM | POA: Insufficient documentation

## 2019-10-01 DIAGNOSIS — E1122 Type 2 diabetes mellitus with diabetic chronic kidney disease: Secondary | ICD-10-CM | POA: Insufficient documentation

## 2019-10-01 DIAGNOSIS — I5032 Chronic diastolic (congestive) heart failure: Secondary | ICD-10-CM | POA: Insufficient documentation

## 2019-10-01 DIAGNOSIS — I130 Hypertensive heart and chronic kidney disease with heart failure and stage 1 through stage 4 chronic kidney disease, or unspecified chronic kidney disease: Secondary | ICD-10-CM | POA: Insufficient documentation

## 2019-10-01 DIAGNOSIS — E11622 Type 2 diabetes mellitus with other skin ulcer: Secondary | ICD-10-CM | POA: Diagnosis not present

## 2019-10-01 DIAGNOSIS — I4891 Unspecified atrial fibrillation: Secondary | ICD-10-CM | POA: Diagnosis not present

## 2019-10-01 DIAGNOSIS — N189 Chronic kidney disease, unspecified: Secondary | ICD-10-CM | POA: Diagnosis not present

## 2019-10-01 NOTE — Progress Notes (Signed)
**Note Michael-Identified via Obfuscation** [name], [name] ([id number]) Visit Report for 10/01/2019 HPI Details Patient Name: Date of Service: [name], [name] 10/01/2019 8:45 AM Medical Record [id number] Patient Account Number: [id number] Date of Birth/Sex: Treating RN: [birth year] (74 y.o. M) Primary Care [name]: [name] Other Clinician: Referring [name]: Treating [name]/Extender:[name], [name], [name] in Treatment: 8 History of Present Illness HPI Description: [name] HPI Description: 74 year old gentleman who was recently seen by his nephrologist Dr. Donato [name], and noted to have a wound on his left lower extremity which was lacerated 2 months ago and now has reopened. The patient's left shin has a ulceration with some exudate but no evidence of infection and he was referred to [place] for further care as it was known that the patient has had some peripheral vascular disease in the past. Past medical history significant for chronic kidney disease, atrial fibrillation, diabetes mellitus,status post kidney transplant in 1983 and 2005, a week fistula graft placement, status post previous bowel surgery. 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 10/06/2015 -- x-ray of the left tibia and fibula shows no evidence of osteomyelitis. The patient has also had Doppler studies of his extremity and is awaiting the appointment with the vascular surgeon. We have not yet received these reports. 10/13/2015 -- lower extremity venous duplex reflux evaluation shows reflux in the left common femoral vein, left saphenofemoral junction and the proximal greater saphenous vein extending to the proximal calf. There is also reflux in the left proximal to mid small saphenous vein. Arterial duplex studies done showed the resting ABI was not applicable due to tibial artery medial calcification. The left ABI was 0.8 using the Doppler dorsalis pedis indicating mild arterial  occlusive disease at rest with the posterior tibial artery noted to be noncompressible. The right TBI was 1 which is normal and the left ABI was 1 which is normal. Patient has otherwise been doing fine and has been compliant with his dressings. 10/20/2015 -- He was seen by Dr. Adele [name] recently for a vascular opinion on 10/15/2015. His left lower extremity venous insufficiency duplex study revealed GSV reflux,SS vein reflux and deep venous reflux in the common femoral vein. His ABIs were non compressible and his TBI on the right was 1.01 and on the left was 0.80. He was asked to continue with the wound care with compressive therapy followed by EVLA of the left GS vein 3 months. He recommended 20-30 mm thigh-high compression stockings and the need for a three-month trial of this. The patient had an Unna boot applied at the vascular office but he could not tolerate this with a lot of pain and issues with his toes and hence came here on Friday for removal of this and we reapplied a 2 layer compression. 11/10/2015 -- patient still has not purchased his 20-30 mm thigh-high compression stockings as prescribed by Dr. Bridgett [name]. Readmission: 08/08/18 on evaluation today patient presents for readmission concerning a new injury to the left anterior lower extremity. He was previously seen in 2017 here in our clinic. He states that he has done fairly well since that point. Nonetheless he is having at this time some pain but states that he hit this on a table that fell over and actually struck his leg. This appears to have pulled back some of his skin which folded in on itself and is causing some difficulty as far as that is concerned. There does not appear to be any evidence of infection at  this time. No fevers, chills, nausea, or vomiting noted at this time. He's been using dressings on his own currently without complication. 08/15/18 on evaluation today patient actually appears to be doing somewhat better in  regard to his wound of the lower [name] when compared to the first visit last week. I had to do a much more extensive debridement at that time it does appear that I'm gonna have to perform some debridement today but it does not look to be as extensive by any means. Nonetheless fortunately he does not show any signs of infection he does have discomfort at this site. I believe based on what I'm seeing currently he may benefit from Iodoflex to help keep the wound bed clean. Patient tolerated therapy without complication. Upon evaluation today the patient actually appears to be doing excellent in regard to his left lower extremity ulcer. This is much better than the previous two visits where he had a lot of necrotic tissue around the edge of the wound simply due to the fact that again there was a significant skin tear where the edge had been cleared away prior to reattaching and being able to heal appropriately. He seems to be doing much better at this point. 08/28/18 on evaluation today the patient's wound actually does appear to be showing signs of improvement. With that being said though he is improving he would likely note even greater improvement if we were able to sharply debride the wound. Nonetheless this caused him to much discomfort he tells me. 09/04/18 on evaluation today patient actually appears to be showing signs of improvement in regard to his left lower extremity ulcer. He has been tolerating the dressing changes including the wrap although he tells me at this point that the burning does last for a couple of days even with just the Iodoflex. I was afraid that this may been part of the issue that he was having with discomfort. It does seem to be the case. Nonetheless he shows no signs of evidence of infection at this time which is good news. No fevers chills noted ADMISSION to [name] 10/05/2018 This is a patient who was cared for in 2017 and again in the fall of  this year at our sister clinic and [facility] [place]. He actually lives in [place] in [place]. We have been dealing with an apparent traumatic area on the left anterior tibial area. This is been present for the last several months. He was supposed to be using Iodoflex Kerlix and Coban however he was hospitalized from 09/05/2018 through 09/11/2018 with delirium secondary to pneumonia. Since then he is only been putting Vaseline gauze on this without compression. He also has a more recent skin tear on the dorsal right hand that may have only happened in the last week. The patient had arterial studies done in 2017 in January which was 3 years ago. At that point he had noncompressible ABIs but really quite good TBI's both normal. Triphasic waveforms on the right monophasic at the left posterior tibial but triphasic at the left dorsalis pedis. His ABIs in our clinic today were both noncompressible 1/23; the patient has wounds on the right dorsal hand just distal to the wrist and on the left anterior lower extremity. Both of these look very healthy he is using Hydrofera Blue 1/30; left anterior lower extremity wound much smaller. Healthy looking surface. The laceration area just distal to the wrist on the dorsal hand on the right is also just about closed  I used Hydrofera Blue here 2/6; left anterior lower extremity wound is much smaller but still open. The laceration area just distal to the wrist on the dorsal hand is fully epithelialized. 2/13; the patient's anterior lower extremity wound is closed. The laceration just distal to the wrist on the dorsal hand is also fully epithelialized and closed. The patient has external compression stockings which I think are 20/30 READMISSION 08/06/2019 [name] is a 74 year old man with had several times previously in our clinic. He is a diabetic with a history of chronic renal insufficiency status post kidney transplant in 1983 and again in 2005. He was then in  2017 with a laceration on the left lower extremity. He was worked up at the time with arterial studies and reflux studies. The arterial studies showed ABIs to be noncompressible but TBI's were within normal limits. I do not have the reflux studies at the moment. He was also sent here in 2019 with a left lower extremity wound and then again in 2020 with left lower extremity trauma a skin tear on the wrist. He was discharged with 20/30 stockings identified from myself that that might not be enough compression. Nevertheless he states he was wearing these fairly reliably. In September he had a fall with a substantial bruise in the area of the wound. He says he saw orthopedics and they told him there was some muscle strain sometime it later this opened into a wound. He has a fairly substantial wound on the right posterior calf. Satellite areas around this including medially and posteriorly. He has not worn his stockings since the injury Past medical history; includes chronic renal failure secondary to diabetes with kidney transplant x2, atrial fibrillation, heart failure with preserved ejection fraction, coronary artery disease. ABIs on the right in our clinic were once again noncompressible 08/13/2019 on evaluation today patient appears to be doing decently well with regard to his wound compared to last week's evaluation. Unfortunately he is still having a lot of discomfort at this point which is I think in some part due to the 3 layer compression wrap which is a little bit stronger I think for him. When he was here before we actually utilized a Kerlix and Coban wrap which he states seemed to got a little bit better. Nonetheless I think we can probably drop back to this in light of the discomfort that he had. Nonetheless the pain was not really right around the wound itself as much as it was around the ankle in particular. The Iodoflex does seem to have done well for him As the wound is appearing  somewhat better today which is excellent news. 11/30; still complaining of a lot of pain. Apparently arterial studies I ordered 2 weeks ago are below. I do not believe we have an appointment with vein and vascular as of yet; ABI Findings: +---------+------------------+-----+----------+--------+ Right Rt Pressure (mmHg)IndexWaveform Comment  +---------+------------------+-----+----------+--------+ PTA >254 1.50 monophasic  +---------+------------------+-----+----------+--------+ DP >254 1.50 monophasic  +---------+------------------+-----+----------+--------+ Great Toe68 0.40 Abnormal   +---------+------------------+-----+----------+--------+ +---------+------------------+-----+----------+-------+ Left Lt Pressure (mmHg)IndexWaveform Comment +---------+------------------+-----+----------+-------+ Brachial 169     +---------+------------------+-----+----------+-------+ PTA >254 1.50 monophasic  +---------+------------------+-----+----------+-------+ DP >254 1.50 monophasic  +---------+------------------+-----+----------+-------+ Great Toe40 0.24 Abnormal   +---------+------------------+-----+----------+-------+ Pedal arteries appear hyperemic. Patient refused Brachial pressure in the right arm. Summary: Right: Resting right ankle-brachial index indicates noncompressible right lower extremity arteries. The right toe- brachial index is abnormal. Left: Resting left ankle-brachial index indicates noncompressible left lower extremity arteries. The left toe-brachial index is abnormal. He is  also having considerably more swelling in his left calf. This was not there when I last saw him 2 weeks ago. He tells me that some of the home health compression wraps have been slipping down and that may be the issue here however a month I am uncertain 15/1; sees vascular on December 22. Still complaining of a lot of pain. DVT study I did last time  was negative for DVT 12/14; still complaining of pain which if this is arterial is certainly claudication and rest keeps him uncomfortable at night. He has an appointment with vein and vascular on December 22. Wound surface is better using Iodoflex. Once the surface of this is satisfactory and we have exhausted the vascular route. Perhaps an advanced treatment option. He has a configuration of the venous ulceration although his arterial studies are not very good. The other issue is the patient has a transplanted kidney. This will make angiography difficult and challenging issue 12/21; still complaining of pain and drainage. We are using Iodoflex on the wound under compression. He sees Dr. Donnetta Hutching tomorrow to evaluate his noninvasive studies noted above. He has a transplanted kidney further complicating the options for angiography. 12/28; still using Iodoflex under compression. I have Dr. Luther Parody note from 12/22. he noted arterial studies revealing monophasic waveforms at the pedal vessels bilaterally and calcified vessels making the ABI unreliable. He did not comment on the reduced TBI's. He felt these were venous wounds based on the palpable dorsalis pedis pulse. 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 And they arranged for formal venous duplex with reflux studies in the next several weeks. Follow-up with either Scot Dock or Dr. Oneida Alar 09/24/2019 still using Iodoflex under compression. He has an appointment with Dr. Doren Custard on 1/7 with regards to his venous disease. The patient was not felt to have a primary arterial problem for the nonhealing of his wound. We did attempt to wrap him with 3 layer when he first came into the clinic he complained of a lot of pain in the ankle although this may have been because the dressing fell down somewhat. He has far too edema fluid in the right leg for a good prognosis about healing this wound He comes in today with an excoriation on the bottom part of his  right fifth toe. He thinks he may have done this taking off his clothes and that something got caught on the toe. There is no open wound however the toe itself is very painful 1/11; we are using Iodoflex under compression. The wound bed is clean. He went on to see Dr. Doren Custard on 09/27/2019 he again feels that the patient's arterial supply is adequate. He feels that he might benefit from right greater saphenous vein ablation for a venous ulcer. In the meantime the area that he was complaining about last week on the right fifth toe. An x-ray that I ordered showed marked soft tissue swelling along the distal aspect of the right foot but there was no evidence of osteomyelitis. He comes in today with the fifth toenail just about coming off. He has black eschar underneath the toe on the plantar aspect. The toe was swollen red and very painful. In the right setting this could be a significant soft tissue infection versus ischemia of the toe itself. He did not show this to Dr. Doren Custard Electronic Signature(s) Signed: 10/01/2019 6:04:26 PM By: [name] MD Entered By: [name] on 10/01/2019 10:17:39 -------------------------------------------------------------------------------- Physical Exam Details Patient Name: Date of Service: [name]  10/01/2019 8:45 AM Medical Record [id number] Patient Account Number: [id number] Date of Birth/Sex: Treating RN: 1946-01-06 (74 y.o. M) Primary Care Lether Tesch: [name] Other Clinician: Referring Dannilynn Gallina: Treating Niles Ess/Extender:[name], [name], [name] in Treatment: 8 Constitutional Patient is hypertensive.. Pulse regular and within target range for patient.Marland Kitchen Respirations regular, non-labored and within target range.. Temperature is normal and within the target range for the patient.Marland Kitchen Appears in no distress. Respiratory work of breathing is normal. Cardiovascular Does have indeed have palpable right  dorsalis pedis pulses. Notes Wound exam; right fifth toe. There is open ulcers at the tip of the right fifth toe plantar aspect and down the pulp of the fifth toe. Exceptionally painful. I did not attempt debridement although it is probably going to need this. No overt evidence of septic arthritis of the interphalangeal joints. Nevertheless the whole issue is very painful. Electronic Signature(s) Signed: 10/01/2019 6:04:26 PM By: [name] MD Entered By: [name] on 10/01/2019 10:18:49 -------------------------------------------------------------------------------- Physician Orders Details Patient Name: Date of Service: [name], [name] 10/01/2019 8:45 AM Medical Record [id number] Patient Account Number: [id number] Date of Birth/Sex: Treating RN: 1946-08-27 (73 y.o. [name] Primary Care Sue Mcalexander: [name] Other Clinician: Referring Quinteria Chisum: Treating [name]/Extender:[name], [name], [name] in Treatment: 8 Verbal / Phone Orders: No Diagnosis Coding ICD-10 Coding Code Description S80.11XD Contusion of right lower leg, subsequent encounter L97.212 Non-pressure chronic ulcer of right calf with fat layer exposed I87.321 Chronic venous hypertension (idiopathic) with inflammation of right lower extremity E11.622 Type 2 diabetes mellitus with other skin ulcer L97.514 Non-pressure chronic ulcer of other part of right foot with necrosis of bone Follow-up Appointments Return Appointment in 1 week. Dressing Change Frequency Wound #3 Right,Posterior Lower Leg Change dressing three times week. - 2x by home health on Wednesdays and Fridays, 1x by wound clinic on Mondays Wound #5 Right Toe Fifth Change dressing three times week. - 2x by home health on Wednesdays and Fridays, 1x by wound clinic on Mondays Skin Barriers/Peri-Wound Care Barrier cream Moisturizing lotion Wound Cleansing Clean wound with Normal Saline. - or normal  saline on days that dressing is changed May shower with protection. Primary Wound Dressing Wound #3 Right,Posterior Lower Leg Iodoflex Wound #5 Right Toe Fifth Calcium Alginate with Silver Secondary Dressing Wound #3 Right,Posterior Lower Leg Foam - foam top bend of foot for protection ABD pad Kerramax - or Zetuvit (or other super absorbent dressing i.e. Lauraine Rinne) Wound #5 Right Toe Fifth Kerlix/Rolled Gauze - toe needs to be completely covered, including toenail Dry Gauze Edema Control 3 Layer Compression System - Right Lower Extremity Avoid standing for long periods of time Elevate legs to the level of the heart or above for 30 minutes daily and/or when sitting, a frequency of: - throughout the day Exercise regularly Additional Orders / Instructions Other: - Go to ER if toe/leg gets more red/black, increased pain, fever [facility] for wound care. - [facility] Patient Medications Allergies: Elavil Notifications Medication Indication Start End Augmentin foot infection 10/01/2019 DOSE oral 500 mg-125 mg tablet - 1 tablet oral bid for 7 days Electronic Signature(s) Signed: 10/01/2019 10:13:00 AM By: [name] MD Entered By: [name] on 10/01/2019 10:12:59 -------------------------------------------------------------------------------- Problem List Details Patient Name: Date of Service: [name], [name] 10/01/2019 8:45 AM Medical Record [id number] Patient Account Number: [id number] Date of Birth/Sex: Treating RN: 1945/11/17 (74 y.o. [name] Primary Care Hudson Lehmkuhl: [name] Other Clinician: Referring Kimberlye Dilger: Treating Quavon Keisling/Extender:[name], [name], Broadus John  A Weeks in Treatment: 8 Active Problems ICD-10 Evaluated Encounter Code Description Active Date Today Diagnosis S80.11XD Contusion of right lower leg, subsequent encounter 08/06/2019 No Yes L97.212 Non-pressure chronic ulcer of  right calf with fat layer 08/06/2019 No Yes exposed I87.321 Chronic venous hypertension (idiopathic) with 08/06/2019 No Yes inflammation of right lower extremity E11.622 Type 2 diabetes mellitus with other skin ulcer 08/06/2019 No Yes L97.518 Non-pressure chronic ulcer of other part of right foot 10/01/2019 No Yes with other specified severity Inactive Problems Resolved Problems Electronic Signature(s) Signed: 10/01/2019 6:04:26 PM By: [name] MD Entered By: [name] on 10/01/2019 10:15:25 -------------------------------------------------------------------------------- Progress Note Details Patient Name: Date of Service: [name], [name] 10/01/2019 8:45 AM Medical Record [id number] Patient Account Number: [id number] Date of Birth/Sex: Treating RN: 1946-01-14 (74 y.o. M) Primary Care Princess Karnes: [name] Other Clinician: Referring Ermine Spofford: Treating Audwin Semper/Extender:[name], [name], [name] in Treatment: 8 Subjective History of Present Illness (HPI) [name] HPI Description: 74 year old gentleman who was recently seen by his nephrologist Dr. Donato [name], and noted to have a wound on his left lower extremity which was lacerated 2 months ago and now has reopened. The patient's left shin has a ulceration with some exudate but no evidence of infection and he was referred to [place] for further care as it was known that the patient has had some peripheral vascular disease in the past. Past medical history significant for chronic kidney disease, atrial fibrillation, diabetes mellitus,status post kidney transplant in 1983 and 2005, a week fistula graft placement, status post previous bowel surgery. 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 10/06/2015 -- x-ray of the left tibia and fibula shows no evidence of osteomyelitis. The patient has also had Doppler studies of his extremity and is awaiting the  appointment with the vascular surgeon. We have not yet received these reports. 10/13/2015 -- lower extremity venous duplex reflux evaluation shows reflux in the left common femoral vein, left saphenofemoral junction and the proximal greater saphenous vein extending to the proximal calf. There is also reflux in the left proximal to mid small saphenous vein. Arterial duplex studies done showed the resting ABI was not applicable due to tibial artery medial calcification. 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 The right TBI was 1 which is normal and the left ABI was 1 which is normal. Patient has otherwise been doing fine and has been compliant with his dressings. 10/20/2015 -- He was seen by Dr. Adele [name] recently for a vascular opinion on 10/15/2015. His left lower extremity venous insufficiency duplex study revealed GSV reflux,SS vein reflux and deep venous reflux in the common femoral vein. His ABIs were non compressible and his TBI on the right was 1.01 and on the left was 0.80. He was asked to continue with the wound care with compressive therapy followed by EVLA of the left GS vein 3 months. He recommended 20-30 mm thigh-high compression stockings and the need for a three-month trial of this. The patient had an Unna boot applied at the vascular office but he could not tolerate this with a lot of pain and issues with his toes and hence came here on Friday for removal of this and we reapplied a 2 layer compression. 11/10/2015 -- patient still has not purchased his 20-30 mm thigh-high compression stockings as prescribed by Dr. Bridgett [name]. Readmission: 08/08/18  on evaluation today patient presents for readmission concerning a new injury to the left anterior lower extremity. He was previously seen in 2017 here in our clinic. He states that he has done fairly well since that  point. Nonetheless he is having at this time some pain but states that he hit this on a table that fell over and actually struck his leg. This appears to have pulled back some of his skin which folded in on itself and is causing some difficulty as far as that is concerned. There does not appear to be any evidence of infection at this time. No fevers, chills, nausea, or vomiting noted at this time. He's been using dressings on his own currently without complication. 08/15/18 on evaluation today patient actually appears to be doing somewhat better in regard to his wound of the lower [name] when compared to the first visit last week. I had to do a much more extensive debridement at that time it does appear that I'm gonna have to perform some debridement today but it does not look to be as extensive by any means. Nonetheless fortunately he does not show any signs of infection he does have discomfort at this site. I believe based on what I'm seeing currently he may benefit from Iodoflex to help keep the wound bed clean. Patient tolerated therapy without complication. Upon evaluation today the patient actually appears to be doing excellent in regard to his left lower extremity ulcer. This is much better than the previous two visits where he had a lot of necrotic tissue around the edge of the wound simply due to the fact that again there was a significant skin tear where the edge had been cleared away prior to reattaching and being able to heal appropriately. He seems to be doing much better at this point. 08/28/18 on evaluation today the patient's wound actually does appear to be showing signs of improvement. With that being said though he is improving he would likely note even greater improvement if we were able to sharply debride the wound. Nonetheless this caused him to much discomfort he tells me. 09/04/18 on evaluation today patient actually appears to be showing signs of improvement in regard to  his left lower extremity ulcer. He has been tolerating the dressing changes including the wrap although he tells me at this point that the burning does last for a couple of days even with just the Iodoflex. I was afraid that this may been part of the issue that he was having with discomfort. It does seem to be the case. Nonetheless he shows no signs of evidence of infection at this time which is good news. No fevers chills noted ADMISSION to [name] 10/05/2018 This is a patient who was cared for in 2017 and again in the fall of this year at our sister clinic and [facility]. He actually lives in [place] in [place]. We have been dealing with an apparent traumatic area on the left anterior tibial area. This is been present for the last several months. He was supposed to be using Iodoflex Kerlix and Coban however he was hospitalized from 09/05/2018 through 09/11/2018 with delirium secondary to pneumonia. Since then he is only been putting Vaseline gauze on this without compression. He also has a more recent skin tear on the dorsal right hand that may have only happened in the last week. The patient had arterial studies done in 2017 in January which was 3 years ago. At that point he  had noncompressible ABIs but really quite good TBI's both normal. Triphasic waveforms on the right monophasic at the left posterior tibial but triphasic at the left dorsalis pedis. His ABIs in our clinic today were both noncompressible 1/23; the patient has wounds on the right dorsal hand just distal to the wrist and on the left anterior lower extremity. Both of these look very healthy he is using Hydrofera Blue 1/30; left anterior lower extremity wound much smaller. Healthy looking surface. The laceration area just distal to the wrist on the dorsal hand on the right is also just about closed I used Hydrofera Blue here 2/6; left anterior lower extremity wound is much smaller but still open. The  laceration area just distal to the wrist on the dorsal hand is fully epithelialized. 2/13; the patient's anterior lower extremity wound is closed. The laceration just distal to the wrist on the dorsal hand is also fully epithelialized and closed. The patient has external compression stockings which I think are 20/30 READMISSION 08/06/2019 [name] is a 74 year old man with had several times previously in our clinic. He is a diabetic with a history of chronic renal insufficiency status post kidney transplant in 1983 and again in 2005. He was then in 2017 with a laceration on the left lower extremity. He was worked up at the time with arterial studies and reflux studies. The arterial studies showed ABIs to be noncompressible but TBI's were within normal limits. I do not have the reflux studies at the moment. He was also sent here in 2019 with a left lower extremity wound and then again in 2020 with left lower extremity trauma a skin tear on the wrist. He was discharged with 20/30 stockings identified from myself that that might not be enough compression. Nevertheless he states he was wearing these fairly reliably. In September he had a fall with a substantial bruise in the area of the wound. He says he saw orthopedics and they told him there was some muscle strain sometime it later this opened into a wound. He has a fairly substantial wound on the right posterior calf. Satellite areas around this including medially and posteriorly. He has not worn his stockings since the injury Past medical history; includes chronic renal failure secondary to diabetes with kidney transplant x2, atrial fibrillation, heart failure with preserved ejection fraction, coronary artery disease. ABIs on the right in our clinic were once again noncompressible 08/13/2019 on evaluation today patient appears to be doing decently well with regard to his wound compared to last week's evaluation. Unfortunately he is still  having a lot of discomfort at this point which is I think in some part due to the 3 layer compression wrap which is a little bit stronger I think for him. When he was here before we actually utilized a Kerlix and Coban wrap which he states seemed to got a little bit better. Nonetheless I think we can probably drop back to this in light of the discomfort that he had. Nonetheless the pain was not really right around the wound itself as much as it was around the ankle in particular. The Iodoflex does seem to have done well for him As the wound is appearing somewhat better today which is excellent news. 11/30; still complaining of a lot of pain. Apparently arterial studies I ordered 2 weeks ago are below. I do not believe we have an appointment with vein and vascular as of yet; ABI Findings: +---------+------------------+-----+----------+--------+ Right Rt Pressure (mmHg)IndexWaveform Comment  +---------+------------------+-----+----------+--------+ PTA >  254 1.50 monophasic  +---------+------------------+-----+----------+--------+ DP >254 1.50 monophasic  +---------+------------------+-----+----------+--------+ Great Toe68 0.40 Abnormal   +---------+------------------+-----+----------+--------+ +---------+------------------+-----+----------+-------+ Left Lt Pressure (mmHg)IndexWaveform Comment +---------+------------------+-----+----------+-------+ Brachial 169     +---------+------------------+-----+----------+-------+ PTA >254 1.50 monophasic  +---------+------------------+-----+----------+-------+ DP >254 1.50 monophasic  +---------+------------------+-----+----------+-------+ Great Toe40 0.24 Abnormal   +---------+------------------+-----+----------+-------+ Pedal arteries appear hyperemic. Patient refused Brachial pressure in the right arm. Summary: Right: Resting right ankle-brachial index indicates noncompressible right lower  extremity arteries. The right toe- brachial index is abnormal. Left: Resting left ankle-brachial index indicates noncompressible left lower extremity arteries. The left toe-brachial index is abnormal. He is also having considerably more swelling in his left calf. This was not there when I last saw him 2 weeks ago. He tells me that some of the home health compression wraps have been slipping down and that may be the issue here however a month I am uncertain 99/3; sees vascular on December 22. Still complaining of a lot of pain. DVT study I did last time was negative for DVT 12/14; still complaining of pain which if this is arterial is certainly claudication and rest keeps him uncomfortable at night. He has an appointment with vein and vascular on December 22. Wound surface is better using Iodoflex. Once the surface of this is satisfactory and we have exhausted the vascular route. Perhaps an advanced treatment option. He has a configuration of the venous ulceration although his arterial studies are not very good. The other issue is the patient has a transplanted kidney. This will make angiography difficult and challenging issue 12/21; still complaining of pain and drainage. We are using Iodoflex on the wound under compression. He sees Dr. Donnetta Hutching tomorrow to evaluate his noninvasive studies noted above. He has a transplanted kidney further complicating the options for angiography. 12/28; still using Iodoflex under compression. I have Dr. Luther Parody note from 12/22. he noted arterial studies revealing monophasic waveforms at the pedal vessels bilaterally and calcified vessels making the ABI unreliable. He did not comment on the reduced TBI's. He felt these were venous wounds based on the palpable dorsalis pedis pulse. 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 And they arranged for formal venous duplex with reflux studies in the next several weeks. Follow-up with either Scot Dock or Dr.  Oneida Alar 09/24/2019 still using Iodoflex under compression. He has an appointment with Dr. Doren Custard on 1/7 with regards to his venous disease. The patient was not felt to have a primary arterial problem for the nonhealing of his wound. We did attempt to wrap him with 3 layer when he first came into the clinic he complained of a lot of pain in the ankle although this may have been because the dressing fell down somewhat. He has far too edema fluid in the right leg for a good prognosis about healing this wound He comes in today with an excoriation on the bottom part of his right fifth toe. He thinks he may have done this taking off his clothes and that something got caught on the toe. There is no open wound however the toe itself is very painful 1/11; we are using Iodoflex under compression. The wound bed is clean. He went on to see Dr. Doren Custard on 09/27/2019 he again feels that the patient's arterial supply is adequate. He feels that he might benefit from right greater saphenous vein ablation for a venous ulcer. In the meantime the area that he was complaining about last week on the right fifth toe. An x-ray that I ordered showed marked soft tissue  swelling along the distal aspect of the right foot but there was no evidence of osteomyelitis. He comes in today with the fifth toenail just about coming off. He has black eschar underneath the toe on the plantar aspect. The toe was swollen red and very painful. In the right setting this could be a significant soft tissue infection versus ischemia of the toe itself. He did not show this to Dr. Doren Custard Objective Constitutional Patient is hypertensive.. Pulse regular and within target range for patient.Marland Kitchen Respirations regular, non-labored and within target range.. Temperature is normal and within the target range for the patient.Marland Kitchen Appears in no distress. Vitals Time Taken: 9:14 AM, Height: 67 in, Weight: 190 lbs, BMI: 29.8, Temperature: 98.6 F, Pulse: 70  bpm, Respiratory Rate: 18 breaths/min, Blood Pressure: 164/86 mmHg, Capillary Blood Glucose: 119 mg/dl. General Notes: patient stated last CBG was 119 Respiratory work of breathing is normal. Cardiovascular Does have indeed have palpable right dorsalis pedis pulses. General Notes: Wound exam; right fifth toe. There is open ulcers at the tip of the right fifth toe plantar aspect and down the pulp of the fifth toe. Exceptionally painful. I did not attempt debridement although it is probably going to need this. No overt evidence of septic arthritis of the interphalangeal joints. Nevertheless the whole issue is very painful. Integumentary (Hair, Skin) Wound #3 status is Open. Original cause of wound was Trauma. The wound is located on the Right,Posterior Lower Leg. The wound measures 15.5cm length x 5.4cm width x 0.3cm depth; 65.738cm^2 area and 19.721cm^3 volume. There is Fat Layer (Subcutaneous Tissue) Exposed exposed. There is no tunneling or undermining noted. There is a large amount of purulent drainage noted. Foul odor after cleansing was noted. The wound margin is well defined and not attached to the wound base. There is medium (34-66%) pink granulation within the wound bed. There is a medium (34-66%) amount of necrotic tissue within the wound bed including Adherent Slough. Wound #5 status is Open. Original cause of wound was Blister. The wound is located on the Right Toe Fifth. The wound measures 1cm length x 1.5cm width x 0.1cm depth; 1.178cm^2 area and 0.118cm^3 volume. There is no tunneling or undermining noted. There is a small amount of serous drainage noted. The wound margin is distinct with the outline attached to the wound base. There is no granulation within the wound bed. There is a large (67- 100%) amount of necrotic tissue within the wound bed including Eschar and Adherent Slough. 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 There was a Three Layer Compression Therapy Procedure by [name], RN. Post procedure Diagnosis Wound #3: Same as Pre-Procedure Plan Follow-up Appointments: Return Appointment in 1 week. Dressing Change Frequency: Wound #3 Right,Posterior Lower Leg: Change dressing three times week. - 2x by home health on Wednesdays and Fridays, 1x by wound clinic on Mondays Wound #5 Right Toe Fifth: Change dressing three times week. - 2x by home health on Wednesdays and Fridays, 1x by wound clinic on Mondays Skin Barriers/Peri-Wound Care: Barrier cream Moisturizing lotion Wound Cleansing: Clean wound with Normal Saline. - or normal saline on days  that dressing is changed May shower with protection. Primary Wound Dressing: Wound #3 Right,Posterior Lower Leg: Iodoflex Wound #5 Right Toe Fifth: Calcium Alginate with Silver Secondary Dressing: Wound #3 Right,Posterior Lower Leg: Foam - foam top bend of foot for protection ABD pad Kerramax - or Zetuvit (or other super absorbent dressing i.e. Lauraine Rinne) Wound #5 Right Toe Fifth: Kerlix/Rolled Gauze - toe needs to be completely covered, including toenail Dry Gauze Edema Control: 3 Layer Compression System - Right Lower Extremity Avoid standing for long periods of time Elevate legs to the level of the heart or above for 30 minutes daily and/or when sitting, a frequency of: - throughout the day Exercise regularly Additional Orders / Instructions: Other: - Go to ER if toe/leg gets more red/black, increased pain, fever Home  Health: [facility] for wound care. - [facility] The following medication(s) was prescribed: Augmentin oral 500 mg-125 mg tablet 1 tablet oral bid for 7 days for foot infection starting 10/01/2019 1. Very concerning deterioration in the right fifth toe. Actually last week he did not have an identifiable open wounds although he had dislodged the toenail. I thought he might of fractured the toe but the x-ray does not show this. 2. I would guess that this was a traumatic wound with secondary cellulitis now that I look at it again. There seems to be active necrosis on the base of the right fifth toe. We will use silver alginate Curlex and a toe sock 3. With considerable difficulty I was able to determine that Augmentin was safe for him in the face of mycophenolate mofetil. According to epocrates almost every antibiotic that I could give him interferes with enterohepatic circulation of mycophenolate mofetil. But I spoke to his nephrologist Dr.: Lovie Macadamia and we feel that an adjusted dose of the Augmentin at 500/125 twice a day should be safe. Whether or not it will be effective or not I am uncertain. 4. We have continued with the Iodoflex to the large venous wound. He has been reviewed by Dr. Doren Custard and Dr. Oneida Alar both of whom feel that the arterial situation here is adequate. He is going for a greater saphenous vein ablation in this leg. Electronic Signature(s) Signed: 10/01/2019 6:04:26 PM By: [name] MD Entered By: [name] on 10/01/2019 10:21:14 -------------------------------------------------------------------------------- SuperBill Details Patient Name: Date of Service: [name], [name] 10/01/2019 Medical Record [id number] Patient Account Number: [id number] Date of Birth/Sex: Treating RN: 27-Oct-1945 (73 y.o. [name] Primary Care Evalyne Cortopassi: [name] Other Clinician: Referring Espn Zeman: Treating Isidora Laham/Extender:[name],  [name], [name] in Treatment: 8 Diagnosis Coding ICD-10 Codes Code Description S80.11XD Contusion of right lower leg, subsequent encounter L97.212 Non-pressure chronic ulcer of right calf with fat layer exposed I87.321 Chronic venous hypertension (idiopathic) with inflammation of right lower extremity E11.622 Type 2 diabetes mellitus with other skin ulcer L97.518 Non-pressure chronic ulcer of other part of right foot with other specified severity Facility Procedures CPT4 Code Description: [id number] (Facility Use Only) [phone] - APPLY MULTLAY COMPRS LWR RT LEG Modifier: Quantity: 1 Physician Procedures CPT4 Code Description: [id number] - WC PHYS LEVEL 4 - EST PT ICD-10 Diagnosis Description L97.518 Non-pressure chronic ulcer of other part of right foot w S80.11XD Contusion of right lower leg, subsequent encounter L97.212 Non-pressure chronic ulcer  of right calf with fat layer E11.622 Type 2 diabetes mellitus with other skin ulcer Modifier: ith other specifi exposed Quantity: 1 ed severity Electronic Signature(s) Signed: 10/01/2019 6:04:26 PM By: [name] MD Entered By:  [name] on 10/01/2019 10:21:43

## 2019-10-02 ENCOUNTER — Other Ambulatory Visit: Payer: Self-pay | Admitting: *Deleted

## 2019-10-02 MED ORDER — LORAZEPAM 1 MG PO TABS: ORAL_TABLET | ORAL | 0 refills | Status: DC

## 2019-10-02 NOTE — Progress Notes (Addendum)
Michael, [name] ([id number]) Visit Report for 10/01/2019 Arrival Information Details Patient Name: Date of Service: [name], [name] 10/01/2019 8:45 AM Medical Record [id number] Patient Account Number: [id number] Date of Birth/Sex: Treating RN: 1946/02/11 (74 y.o. [name] Primary Care Leveon Pelzer: [name] Other Clinician: Referring Leshae Mcclay: Treating Keshawn Fiorito/Extender:Robson, [name], [name] in Treatment: 8 Visit Information History Since Last Visit Added or deleted any medications: No Patient Arrived: Ambulatory Any new allergies or adverse reactions: No Arrival Time: 09:13 Had a fall or experienced change in No Accompanied By: self activities of daily living that may affect Transfer Assistance: None risk of falls: Patient Identification Verified: Yes Signs or symptoms of abuse/neglect since last No Secondary Verification Process Yes visito Completed: Hospitalized since last visit: No Patient Has Alerts: Yes Implantable device outside of the clinic excluding No Patient Alerts: non cellular tissue based products placed in the center compressable since last visit: Has Dressing in Place as Prescribed: Yes Has Compression in Place as Prescribed: Yes Pain Present Now: Yes Electronic Signature(s) Signed: 10/01/2019 5:57:16 PM By: Kela Millin Entered By: Kela Millin on 10/01/2019 09:15:35 -------------------------------------------------------------------------------- Compression Therapy Details Patient Name: Date of Service: Michael, [name] 10/01/2019 8:45 AM Medical Record [id number] Patient Account Number: [id number] Date of Birth/Sex: Treating RN: 09-11-1946 (74 y.o. [name] Primary Care Mihaela Fajardo: [name] Other Clinician: Referring Camelia Stelzner: Treating Burlon Centrella/Extender:Robson, [name], [name] in Treatment: 8 Compression Therapy Performed for Wound Wound #3  Right,Posterior Lower Leg Assessment: Performed By: Clinician [name], RN Compression Type: Three Layer Post Procedure Diagnosis Same as Pre-procedure Electronic Signature(s) Signed: 10/02/2019 6:18:44 PM By: [name] RN, BSN Entered By: [name] on 10/01/2019 10:16:13 -------------------------------------------------------------------------------- Encounter Discharge Information Details Patient Name: Date of Service: Michael, [name] 10/01/2019 8:45 AM Medical Record [id number] Patient Account Number: [id number] Date of Birth/Sex: Treating RN: [birth year] (74 y.o. [name] Primary Care Clarinda Obi: [name] Other Clinician: Referring Paz Winsett: Treating Patrycja Mumpower/Extender:Robson, [name], [name] in Treatment: 8 Encounter Discharge Information Items Discharge Condition: Stable Ambulatory Status: Ambulatory Discharge Destination: Home Transportation: Private Auto Accompanied By: self Schedule Follow-up Appointment: Yes Clinical Summary of Care: Patient Declined Electronic Signature(s) Signed: 10/02/2019 6:12:59 PM By: [name] RN, BSN Entered By: [name] on 10/01/2019 10:26:00 -------------------------------------------------------------------------------- Lower Extremity Assessment Details Patient Name: Date of Service: Michael, [name] 10/01/2019 8:45 AM Medical Record [id number] Patient Account Number: [id number] Date of Birth/Sex: Treating RN: Aug 12, 1946 (74 y.o. [name] Primary Care Jayren Cease: [name] Other Clinician: Referring Cletis Clack: Treating Kimiye Strathman/Extender:Robson, [name], [name] in Treatment: 8 Edema Assessment Assessed: [Left: No] [Right: No] Edema: [Left: Ye] [Right: s] Calf Left: Right: Point of Measurement: 30 cm From Medial Instep cm 42.5 cm Ankle Left: Right: Point of Measurement: 10 cm From Medial Instep cm 25 cm Vascular  Assessment Pulses: Dorsalis Pedis Palpable: [Right:Yes] Electronic Signature(s) Signed: 10/01/2019 5:57:16 PM By: Kela Millin Entered By: Kela Millin on 10/01/2019 09:21:08 -------------------------------------------------------------------------------- Multi Wound Chart Details Patient Name: Date of Service: Michael, [name] 10/01/2019 8:45 AM Medical Record [id number] Patient Account Number: [id number] Date of Birth/Sex: Treating RN: 14-Feb-1946 (74 y.o. 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] Comorbid History: [3:Cataracts, Anemia, Arrhythmia, Congestive Heart Failure, Hypertension, Heart Failure, Hypertension, Type II Diabetes, Gout, Neuropathy] [5:Cataracts, Anemia, Arrhythmia, Congestive Type II Diabetes, Gout, Neuropathy] [N/A:N/A] Date Acquired: [3:06/06/2019] [5:09/19/2019] [N/A:N/A] Weeks of Treatment: [3:8] [5:1] [N/A:N/A] Wound Status: [3:Open] [5:Open] [N/A:N/A] Measurements L x W x D 15.5x5.4x0.3 [5:1x1.5x0.1] [N/A:N/A] (cm) Area (cm) : [3:65.738] [5:1.178] [N/A:N/A] Volume (cm) : [3:19.721] [5:0.118] [N/A:N/A] % Reduction in Area: [3:-191.60%] [5:-100.00%] [N/A:N/A] % Reduction in Volume: -191.60% [5:-100.00%] [N/A:N/A] Classification: [3:Partial Thickness] [5:Grade 2] [N/A:N/A] Exudate Amount: [3:Large] [5:Small] [N/A:N/A] Exudate Type: [3:Purulent]  [5:Serous] [N/A:N/A] Exudate Color: [3:yellow, brown, green] [5:amber] [N/A:N/A] Foul Odor After Cleansing:Yes [5:No] [N/A:N/A] Odor Anticipated Due to No [5:N/A] [N/A:N/A] Product Use: Wound Margin: [3:Well defined, not attached] [5:Distinct, outline attached] [N/A:N/A] Granulation Amount: [3:Medium (34-66%)] [5:None Present (0%)] [N/A:N/A] Granulation Quality: [3:Pink] [5:N/A] [N/A:N/A] Necrotic Amount: [3:Medium (34-66%)] [5:Large (67-100%)] [N/A:N/A] Necrotic Tissue: [3:Adherent Slough] [5:Eschar, Adherent Slough] [N/A:N/A] Exposed Structures: [3:Fat Layer (Subcutaneous Tissue) Exposed: Yes Fascia: No Tendon: No Muscle: No Joint: No Bone: No None] [5:Fascia: No Fat Layer (Subcutaneous Tissue) Exposed: No Tendon: No Muscle: No Joint: No Bone: No None] [N/A:N/A N/A] Treatment Notes Electronic Signature(s) Signed: 10/01/2019 6:04:26 PM By: [name] MD Entered By: [name] on 10/01/2019 10:15:42 -------------------------------------------------------------------------------- [place] Details Patient Name: Date of Service: JIRAIYA, MCEWAN 10/01/2019 8:45 AM Medical Record [id number] Patient Account Number: [id number] Date of Birth/Sex: Treating RN: 1945-11-07 (74 y.o. [name] Primary Care Diamante Truszkowski: [name] Other Clinician: Referring Hansini Clodfelter: Treating Lawsyn Heiler/Extender:Robson, [name], [name] in Treatment: 8 Active Inactive Nutrition Nursing Diagnoses: Impaired glucose control: actual or potential Potential for alteratiion in Nutrition/Potential for imbalanced nutrition Goals: Patient/caregiver agrees to and verbalizes understanding of need to use nutritional supplements and/or vitamins as prescribed Date Initiated: 08/06/2019 Date Inactivated: 09/03/2019 Target Resolution Date Initiated: 08/06/2019 Date Inactivated: 09/03/2019 Date: 09/07/2019 Goal Status: Met Patient/caregiver will maintain  therapeutic glucose control Date Initiated: 08/06/2019 Target Resolution Date: 11/02/2019 Goal Status: Active Interventions: Assess HgA1c results as ordered upon admission and as needed Assess patient nutrition upon admission and as needed per policy Provide education on elevated blood sugars and impact on wound healing Provide education on nutrition Treatment Activities: Education provided on Nutrition : 08/06/2019 Notes: Venous Leg Ulcer Nursing Diagnoses: Knowledge deficit related to disease process and management Goals: Patient will maintain optimal edema control Date Initiated: 08/06/2019 Target Resolution Date: 11/02/2019 Goal Status: Active Patient/caregiver will verbalize understanding of disease process and disease management Target Resolution Date Initiated: 08/06/2019 Date Inactivated: 09/03/2019 Date: 09/07/2019 Goal Status: Met Interventions: Assess peripheral edema status every visit. Compression as ordered Provide education on venous insufficiency Notes: Wound/Skin Impairment Nursing Diagnoses: Impaired tissue integrity Goals: Patient/caregiver will verbalize understanding of skin care regimen Date Initiated: 08/06/2019 Target Resolution Date: 11/02/2019 Goal Status: Active Ulcer/skin breakdown will have a volume reduction of 30% by week 4 Target Resolution Date Initiated: 08/06/2019 Date Inactivated: 09/03/2019 Date: 09/07/2019 Unmet Reason: PAD, Goal Status: Unmet necrotic surface Interventions: Assess patient/caregiver ability to obtain necessary supplies Assess patient/caregiver ability to perform ulcer/skin care regimen upon admission and as needed Assess ulceration(s) every visit Provide education on ulcer and skin care Notes: Electronic Signature(s) Signed: 10/02/2019 6:18:44 PM By: [name] RN, BSN Entered By: [name] on 10/01/2019 18:41:30 -------------------------------------------------------------------------------- Pain  Assessment Details Patient Name: Date of Service: SEVON, ROTERT 10/01/2019 8:45 AM Medical Record [id number] Patient Account Number: [id number] Date of Birth/Sex: Treating  RN: 03-Sep-1946 (74 y.o. [name] Primary Care Suesan Mohrmann: [name] Other Clinician: Referring Arsema Tusing: Treating Alexzandrea Normington/Extender:Robson, [name], [name] in Treatment: 8 Active Problems Location of Pain Severity and Description of Pain Patient Has Paino Yes Site Locations Pain Location: Pain in Ulcers With Dressing Change: Yes Duration of the Pain. Constant / Intermittento Constant Rate the pain. Current Pain Level: 4 Worst Pain Level: 9 Least Pain Level: 3 Tolerable Pain Level: 4 Character of Pain Describe the Pain: Burning, Shooting, Stabbing Pain Management and Medication Current Pain Management: Electronic Signature(s) Signed: 10/01/2019 5:57:16 PM By: Kela Millin Entered By: Kela Millin on 10/01/2019 09:16:13 -------------------------------------------------------------------------------- Patient/Caregiver Education Details Patient Name: Date of Service: KIMM, UNGARO 1/11/2021andnbsp8:45 AM Medical Record [id number] Patient Account Number: [id number] Date of Birth/Gender: Treating RN: 02-03-1946 (74 y.o. [name] Primary Care Physician: [name] Other Clinician: Referring Physician: Treating Physician/Extender:Robson, [name], [name] in Treatment: 8 Education Assessment Education Provided To: Patient Education Topics Provided Wound/Skin Impairment: Methods: Explain/Verbal Responses: State content correctly Electronic Signature(s) Signed: 10/02/2019 6:18:44 PM By: [name] RN, BSN Entered By: [name] on 10/01/2019 18:41:39 -------------------------------------------------------------------------------- Wound Assessment Details Patient Name: Date of  Service: TAIVON, HAROON 10/01/2019 8:45 AM Medical Record [id number] Patient Account Number: [id number] Date of Birth/Sex: Treating RN: Jun 20, 1946 (74 y.o. [name] Primary Care Rozanna Cormany: [name] Other Clinician: Referring Aidon Klemens: Treating Maui Britten/Extender:Robson, [name], [name] in Treatment: 8 Wound Status Wound Number: 3 Primary Venous Leg Ulcer Etiology: Wound Location: Right Lower Leg - Posterior Wound Open Wounding Event: Trauma Status: Date Acquired: 06/06/2019 Comorbid Cataracts, Anemia, Arrhythmia, Congestive Weeks Of Treatment: 8 History: Heart Failure, Hypertension, Type II Diabetes, Clustered Wound: No Gout, Neuropathy Photos Wound Measurements Length: (cm) 15.5 % Reductio Width: (cm) 5.4 % Reductio Depth: (cm) 0.3 Epithelial Area: (cm) 65.738 Tunneling Volume: (cm) 19.721 Undermini Wound Description Classification: Partial Thickness Wound Margin: Well defined, not attached Exudate Amount: Large Exudate Type: Purulent Exudate Color: yellow, brown, green Wound Bed Granulation Amount: Medium (34-66%) Granulation Quality: Pink Necrotic Amount: Medium (34-66%) Necrotic Quality: Adherent Slough Foul Odor After Cleansing: Yes Due to Product Use: No Slough/Fibrino Yes Exposed Structure Fascia Exposed: No Fat Layer (Subcutaneous Tissue) Exposed: Yes Tendon Exposed: No Muscle Exposed: No Joint Exposed: No Bone Exposed: No n in Area: -191.6% n in Volume: -191.6% ization: None : No ng: No Treatment Notes Wound #3 (Right, Posterior Lower Leg) 2. Periwound Care Barrier cream Moisturizing lotion 3. Primary Dressing Applied Iodoflex 4. Secondary Dressing ABD Pad Kerramax/Xtrasorb 6. Support Layer Applied 3 layer compression wrap Notes zetivit. netting. Electronic Signature(s) Signed: 10/04/2019 3:33:56 PM By: Mikeal Hawthorne EMT/HBOT Signed: 10/05/2019 5:57:08 PM By: Kela Millin Previous  Signature: 10/01/2019 5:57:16 PM Version By: Kela Millin Entered By: Mikeal Hawthorne on 10/04/2019 11:28:15 -------------------------------------------------------------------------------- Wound Assessment Details Patient Name: Date of Service: MARRELL, DICAPRIO 10/01/2019 8:45 AM Medical Record [id number] Patient Account Number: [id number] Date of Birth/Sex: Treating RN: December 19, 1945 (74 y.o. [name] Primary Care Betsabe Iglesia: [name] Other Clinician: Referring Arneda Sappington: Treating Makell Drohan/Extender:Robson, [name], [name] in Treatment: 8 Wound Status Wound Number: 5 Primary Diabetic Wound/Ulcer of the Lower Extremity Etiology: Wound Location: Right Toe Fifth Wound Open Wounding Event: Blister Status: Date Acquired: 09/19/2019 Comorbid Cataracts, Anemia, Arrhythmia, Congestive Weeks Of Treatment: 1 History: Heart Failure, Hypertension, Type II Diabetes, Clustered Wound: No Gout, Neuropathy Photos Wound Measurements Length: (cm) 1 % Reduction in Width: (cm) 1.5 % Reduction in Depth: (cm) 0.1 Epithelializat  Area: (cm) 1.178 Tunneling: Volume: (cm) 0.118 Undermining: Wound Description Classification: Grade 2 Foul Odor Afte Wound Margin: Distinct, outline attached Slough/Fibrino Exudate Amount: Small Exudate Type: Serous Exudate Color: amber Wound Bed Granulation Amount: None Present (0%) Necrotic Amount: Large (67-100%) Fascia Exposed Necrotic Quality: Eschar, Adherent Slough Fat Layer (Sub Tendon Exposed Muscle Exposed Joint Exposed: Bone Exposed: r Cleansing: No Yes Exposed Structure : No cutaneous Tissue) Exposed: No : No : No No No Area: -100% Volume: -100% ion: None No No Treatment Notes Wound #5 (Right Toe Fifth) 3. Primary Dressing Applied Calcium Alginate Ag 4. Secondary Dressing Roll Gauze 5. Secured With Tape 7. Footwear/Offloading device applied Surgical shoe Electronic  Signature(s) Signed: 10/04/2019 3:33:56 PM By: Mikeal Hawthorne EMT/HBOT Signed: 10/05/2019 5:57:08 PM By: Kela Millin Previous Signature: 10/01/2019 5:57:16 PM Version By: Kela Millin Entered By: Mikeal Hawthorne on 10/04/2019 11:28:44 -------------------------------------------------------------------------------- Vitals Details Patient Name: Date of Service: ANTHONI, GEERTS 10/01/2019 8:45 AM Medical Record [id number] Patient Account Number: [id number] Date of Birth/Sex: Treating RN: 1946-07-23 (74 y.o. [name] Primary Care Mikhael Hendriks: [name] Other Clinician: Referring Shynice Sigel: Treating Latifa Noble/Extender:Robson, [name], [name] in Treatment: 8 Vital Signs Time Taken: 09:14 Temperature (F): 98.6 Height (in): 67 Pulse (bpm): 70 Weight (lbs): 190 Respiratory Rate (breaths/min): 18 Body Mass Index (BMI): 29.8 Blood Pressure (mmHg): 164/86 Capillary Blood Glucose (mg/dl): 119 Reference Range: 80 - 120 mg / dl Notes patient stated last CBG was 119 Electronic Signature(s) Signed: 10/01/2019 5:57:16 PM By: Kela Millin Entered By: Kela Millin on 10/01/2019 09:15:18

## 2019-10-03 ENCOUNTER — Telehealth (HOSPITAL_COMMUNITY): Payer: Self-pay

## 2019-10-03 NOTE — Telephone Encounter (Signed)

## 2019-10-04 ENCOUNTER — Ambulatory Visit (INDEPENDENT_AMBULATORY_CARE_PROVIDER_SITE_OTHER): Payer: Medicare Other | Admitting: Vascular Surgery

## 2019-10-04 ENCOUNTER — Encounter: Payer: Self-pay | Admitting: Vascular Surgery

## 2019-10-04 ENCOUNTER — Other Ambulatory Visit: Payer: Self-pay

## 2019-10-04 VITALS — BP 128/71 | HR 69 | Temp 97.0°F | Resp 18 | Ht 68.0 in | Wt 190.0 lb

## 2019-10-04 DIAGNOSIS — I83012 Varicose veins of right lower extremity with ulcer of calf: Secondary | ICD-10-CM | POA: Diagnosis not present

## 2019-10-04 DIAGNOSIS — L97219 Non-pressure chronic ulcer of right calf with unspecified severity: Secondary | ICD-10-CM

## 2019-10-04 HISTORY — PX: ENDOVENOUS ABLATION SAPHENOUS VEIN W/ LASER: SUR449

## 2019-10-04 NOTE — Progress Notes (Signed)
     Laser Ablation Procedure    Date: 10/04/2019   [name] DOB:[birth year]  Consent signed: Yes    Surgeon: Gae Gallop MD  Procedure: Laser Ablation: right Greater Saphenous Vein  BP 128/71 (BP Location: Left Arm, Patient Position: Sitting, Cuff Size: Normal)   Pulse 69   Temp (!) 97 F (36.1 C) (Temporal)   Resp 18   Ht 5\' 8"  (1.727 m)   Wt 190 lb (86.2 kg)   SpO2 98%   BMI 28.89 kg/m   Tumescent Anesthesia: 450 cc 0.9% NaCl with 50 cc Lidocaine HCL 1%  and 15 cc 8.4% NaHCO3  Local Anesthesia: 5 cc Lidocaine HCL and NaHCO3 (ratio 2:1)  15 watts continuous mode        Total energy: 2112 Joules    Total time: 2:22  Laser Fiber Ref. # [id number]      Lot # [id number]    Patient tolerated procedure well  Notes: Patient wore face mask.  All staff members wore facial masks and facial shields/goggles.  [name] took Ativan 1 mg on 10-04-2019 at 9:30 AM and at 10:30 AM.   Description of Procedure:  After marking the course of the secondary varicosities, the patient was placed on the operating table in the supine position, and the right leg was prepped and draped in sterile fashion.   Local anesthetic was administered and under ultrasound guidance the saphenous vein was accessed with a micro needle and guide wire; then the mirco puncture sheath was placed.  A guide wire was inserted saphenofemoral junction , followed by a 5 french sheath.  The position of the sheath and then the laser fiber below the junction was confirmed using the ultrasound.  Tumescent anesthesia was administered along the course of the saphenous vein using ultrasound guidance. The patient was placed in Trendelenburg position and protective laser glasses were placed on patient and staff, and the laser was fired at 15 watts continuous mode advancing 1-74mm/second for a total of 2112 joules.       Steri strip was applied to the IV insertion site and ABD pads were applied.  Ace wrap bandages were applied   at the top of the saphenofemoral junction. Blood loss was less than 15 cc.  Discharge instructions reviewed with patient and hardcopy of discharge instructions given to patient to take home. The patient ambulated out of the operating room having tolerated the procedure well.

## 2019-10-04 NOTE — Progress Notes (Signed)
Patient name: [name] MRN: [id number] DOB: [birth year] Sex: male  REASON FOR VISIT: For endovenous laser ablation right great saphenous vein.  HPI: [name] is a 74 y.o. male who I last saw on 09/27/2019.  He had previously been seeing Dr. Sherren Mocha early in December 2020.  He had previous laser ablation of the left great saphenous vein in 2017.  He developed an open venous ulcer in his right leg.  He had a palpable dorsalis pedis pulse.  He had evidence of significant reflux in the right great saphenous vein.  He had a very extensive wound in the posterior aspect of his right leg as documented on my previous note.  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  There was reflux in the great saphenous vein from the saphenofemoral junction to the knee.  He comes in for endovenous laser ablation of the right great saphenous vein  Current Outpatient Medications  Medication Sig Dispense Refill  . acetaminophen (TYLENOL) 500 MG tablet Take 1,000 mg by mouth every 8 (eight) hours as needed for moderate pain.     . Alcohol Swabs (B-D SINGLE USE SWABS REGULAR) PADS Use as directed    . aspirin 81 MG tablet Take 81 mg by mouth daily.      Marland Kitchen atorvastatin (LIPITOR) 10 MG tablet Take 10 mg by mouth daily.    Marland Kitchen BAYER CONTOUR NEXT TEST test strip Use as directed    . BD PEN NEEDLE NANO U/F 32G X 4 MM MISC Use as directed    . calcitRIOL (ROCALTROL) 0.25 MCG capsule Take 0.25 mcg by mouth daily. daily    . carvedilol (COREG) 6.25 MG tablet TAKE 1 TABLET TWICE A DAY WITH MEALS (PLEASE CALL [phone] TO SET UP YEARLY APPOINTMENT WITH DR Tamala Julian FOR FURTHER REFILLS. THANK YOU) 180 tablet 3  . diphenhydrAMINE (BENADRYL) 25 mg capsule Take 1 capsule (25 mg total) by mouth at bedtime as needed (for sleep/allergies.). 30 capsule 0  . furosemide (LASIX) 40 MG tablet Take 80 mg by mouth daily. Fluid. 30  tablet   . gabapentin (NEURONTIN) 100 MG capsule Take 100 mg by mouth 3 (three) times daily as needed (for nerve pain). Take three times/day as needed    . HUMALOG KWIKPEN 100 UNIT/ML KwikPen Inject 3-5 Units into the skin 3 (three) times daily.  6  . hydrALAZINE (APRESOLINE) 25 MG tablet Take 25 mg by mouth 3 (three) times daily.   4  . LANTUS SOLOSTAR 100 UNIT/ML Solostar Pen Inject 7-15 Units into the skin daily. If blood sugars are >300, use 15 units, <300 use 7 units    . latanoprost (XALATAN) 0.005 % ophthalmic solution Place 1 drop into both eyes at bedtime.    Marland Kitchen linagliptin (TRADJENTA) 5 MG TABS tablet Take 5 mg by mouth daily.      Marland Kitchen LORazepam (ATIVAN) 1 MG tablet Take 1 tablet prior to leaving house on day of procedure. Bring second tablet with you to office on day of procedure. 2 tablet 0  . MICROLET LANCETS MISC Use as directed    . mycophenolate (CELLCEPT) 500 MG tablet Take 500 mg by mouth 2 (two) times daily.     . predniSONE (DELTASONE) 5 MG tablet Take 5 mg by mouth daily.  2  . tacrolimus (PROGRAF) 1 MG capsule Take 1 mg by mouth 2 (two) times daily.     Marland Kitchen  VASCEPA 1 g CAPS Take 4 g by mouth daily.     No current facility-administered medications for this visit.    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  I interrogated the right great saphenous vein with the SonoSite.  I felt that I could cannulate this just above the knee.  The right leg was prepped and draped in usual sterile fashion.  Under ultrasound guidance, after the skin was anesthetized, I cannulated the right great saphenous vein with a micropuncture needle and a micropuncture  sheath was introduced over a wire.  The J-wire was advanced to just below the saphenofemoral junction.  The 35 cm sheath was advanced over the wire and the wire and dilator removed.  I then advanced the laser fiber through the sheath to approximately 2-1/2 to 3 cm from the saphenofemoral junction.  Tumescent anesthesia was then administered circumferentially around the vein from the point of cannulation up to the saphenofemoral junction.  The patient was then placed in Trendelenburg.  Laser ablation was performed of the great saphenous vein from 2-1/2 to 3 cm distal to the saphenofemoral junction to just above the knee.  2112 J of energy were used.  Patient tolerated the procedure well.  Pressure dressing was applied.  He will return in 1 week for follow-up duplex.  [name] [facility] [phone]

## 2019-10-05 ENCOUNTER — Encounter: Payer: Self-pay | Admitting: Vascular Surgery

## 2019-10-08 ENCOUNTER — Other Ambulatory Visit: Payer: Self-pay

## 2019-10-08 ENCOUNTER — Encounter (HOSPITAL_BASED_OUTPATIENT_CLINIC_OR_DEPARTMENT_OTHER): Payer: Medicare Other | Admitting: Internal Medicine

## 2019-10-08 DIAGNOSIS — S8011XD Contusion of right lower leg, subsequent encounter: Secondary | ICD-10-CM | POA: Diagnosis not present

## 2019-10-09 NOTE — Progress Notes (Signed)
Michael, [name] ([id number]) Visit Report for 10/08/2019 HPI Details Patient Name: Date of Service: Michael, [name] 10/08/2019 9:15 AM Medical Record [id number] Patient Account Number: [id number] Date of Birth/Sex: Treating RN: 1946/02/16 (74 y.o. M) Primary Care Provider: Donetta [name] Other Clinician: Referring Provider: Treating Provider/Extender:[name], [name], [name] in Treatment: 9 History of Present Illness HPI Description: [name] HPI Description: 74 year old gentleman who was recently seen by his nephrologist Dr. Donato [name], and noted to have a wound on his left lower extremity which was lacerated 2 months ago and now has reopened. The patient's left shin has a ulceration with some exudate but no evidence of infection and he was referred to [place] for further care as it was known that the patient has had some peripheral vascular disease in the past. Past medical history significant for chronic kidney disease, atrial fibrillation, diabetes mellitus,status post kidney transplant in 1983 and 2005, a week fistula graft placement, status post previous bowel surgery. 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 10/06/2015 -- x-ray of the left tibia and fibula shows no evidence of osteomyelitis. The patient has also had Doppler studies of his extremity and is awaiting the appointment with the vascular surgeon. We have not yet received these reports. 10/13/2015 -- lower extremity venous duplex reflux evaluation shows reflux in the left common femoral vein, left saphenofemoral junction and the proximal greater saphenous vein extending to the proximal calf. There is also reflux in the left proximal to mid small saphenous vein. Arterial duplex studies done showed the resting ABI was not applicable due to tibial artery medial calcification. The left ABI was 0.8 using the Doppler dorsalis pedis indicating mild arterial  occlusive disease at rest with the posterior tibial artery noted to be noncompressible. The right TBI was 1 which is normal and the left ABI was 1 which is normal. Patient has otherwise been doing fine and has been compliant with his dressings. 10/20/2015 -- He was seen by Dr. Adele [name] recently for a vascular opinion on 10/15/2015. His left lower extremity venous insufficiency duplex study revealed GSV reflux,SS vein reflux and deep venous reflux in the common femoral vein. His ABIs were non compressible and his TBI on the right was 1.01 and on the left was 0.80. He was asked to continue with the wound care with compressive therapy followed by EVLA of the left GS vein 3 months. He recommended 20-30 mm thigh-high compression stockings and the need for a three-month trial of this. The patient had an Unna boot applied at the vascular office but he could not tolerate this with a lot of pain and issues with his toes and hence came here on Friday for removal of this and we reapplied a 2 layer compression. 11/10/2015 -- patient still has not purchased his 20-30 mm thigh-high compression stockings as prescribed by Dr. Bridgett [name]. Readmission: 08/08/18 on evaluation today patient presents for readmission concerning a new injury to the left anterior lower extremity. He was previously seen in 2017 here in our clinic. He states that he has done fairly well since that point. Nonetheless he is having at this time some pain but states that he hit this on a table that fell over and actually struck his leg. This appears to have pulled back some of his skin which folded in on itself and is causing some difficulty as far as that is concerned. There does not appear to be any evidence of infection at  this time. No fevers, chills, nausea, or vomiting noted at this time. He's been using dressings on his own currently without complication. 08/15/18 on evaluation today patient actually appears to be doing somewhat better in  regard to his wound of the lower [name] when compared to the first visit last week. I had to do a much more extensive debridement at that time it does appear that I'm gonna have to perform some debridement today but it does not look to be as extensive by any means. Nonetheless fortunately he does not show any signs of infection he does have discomfort at this site. I believe based on what I'm seeing currently he may benefit from Iodoflex to help keep the wound bed clean. Patient tolerated therapy without complication. Upon evaluation today the patient actually appears to be doing excellent in regard to his left lower extremity ulcer. This is much better than the previous two visits where he had a lot of necrotic tissue around the edge of the wound simply due to the fact that again there was a significant skin tear where the edge had been cleared away prior to reattaching and being able to heal appropriately. He seems to be doing much better at this point. 08/28/18 on evaluation today the patient's wound actually does appear to be showing signs of improvement. With that being said though he is improving he would likely note even greater improvement if we were able to sharply debride the wound. Nonetheless this caused him to much discomfort he tells me. 09/04/18 on evaluation today patient actually appears to be showing signs of improvement in regard to his left lower extremity ulcer. He has been tolerating the dressing changes including the wrap although he tells me at this point that the burning does last for a couple of days even with just the Iodoflex. I was afraid that this may been part of the issue that he was having with discomfort. It does seem to be the case. Nonetheless he shows no signs of evidence of infection at this time which is good news. No fevers chills noted ADMISSION to [name] 10/05/2018 This is a patient who was cared for in 2017 and again in the fall of  this year at our sister clinic and [place]. He actually lives in [place] in [place]. We have been dealing with an apparent traumatic area on the left anterior tibial area. This is been present for the last several months. He was supposed to be using Iodoflex Kerlix and Coban however he was hospitalized from 09/05/2018 through 09/11/2018 with delirium secondary to pneumonia. Since then he is only been putting Vaseline gauze on this without compression. He also has a more recent skin tear on the dorsal right hand that may have only happened in the last week. The patient had arterial studies done in 2017 in January which was 3 years ago. At that point he had noncompressible ABIs but really quite good TBI's both normal. Triphasic waveforms on the right monophasic at the left posterior tibial but triphasic at the left dorsalis pedis. His ABIs in our clinic today were both noncompressible 1/23; the patient has wounds on the right dorsal hand just distal to the wrist and on the left anterior lower extremity. Both of these look very healthy he is using Hydrofera Blue 1/30; left anterior lower extremity wound much smaller. Healthy looking surface. The laceration area just distal to the wrist on the dorsal hand on the right is also just about closed  I used Hydrofera Blue here 2/6; left anterior lower extremity wound is much smaller but still open. The laceration area just distal to the wrist on the dorsal hand is fully epithelialized. 2/13; the patient's anterior lower extremity wound is closed. The laceration just distal to the wrist on the dorsal hand is also fully epithelialized and closed. The patient has external compression stockings which I think are 20/30 READMISSION 08/06/2019 [name] is a 74 year old man with had several times previously in our clinic. He is a diabetic with a history of chronic renal insufficiency status post kidney transplant in 1983 and again in 2005. He was then in  2017 with a laceration on the left lower extremity. He was worked up at the time with arterial studies and reflux studies. The arterial studies showed ABIs to be noncompressible but TBI's were within normal limits. I do not have the reflux studies at the moment. He was also sent here in 2019 with a left lower extremity wound and then again in 2020 with left lower extremity trauma a skin tear on the wrist. He was discharged with 20/30 stockings identified from myself that that might not be enough compression. Nevertheless he states he was wearing these fairly reliably. In September he had a fall with a substantial bruise in the area of the wound. He says he saw orthopedics and they told him there was some muscle strain sometime it later this opened into a wound. He has a fairly substantial wound on the right posterior calf. Satellite areas around this including medially and posteriorly. He has not worn his stockings since the injury Past medical history; includes chronic renal failure secondary to diabetes with kidney transplant x2, atrial fibrillation, heart failure with preserved ejection fraction, coronary artery disease. ABIs on the right in our clinic were once again noncompressible 08/13/2019 on evaluation today patient appears to be doing decently well with regard to his wound compared to last week's evaluation. Unfortunately he is still having a lot of discomfort at this point which is I think in some part due to the 3 layer compression wrap which is a little bit stronger I think for him. When he was here before we actually utilized a Kerlix and Coban wrap which he states seemed to got a little bit better. Nonetheless I think we can probably drop back to this in light of the discomfort that he had. Nonetheless the pain was not really right around the wound itself as much as it was around the ankle in particular. The Iodoflex does seem to have done well for him As the wound is appearing  somewhat better today which is excellent news. 11/30; still complaining of a lot of pain. Apparently arterial studies I ordered 2 weeks ago are below. I do not believe we have an appointment with vein and vascular as of yet; ABI Findings: +---------+------------------+-----+----------+--------+ Right Rt Pressure (mmHg)IndexWaveform Comment  +---------+------------------+-----+----------+--------+ PTA >254 1.50 monophasic  +---------+------------------+-----+----------+--------+ DP >254 1.50 monophasic  +---------+------------------+-----+----------+--------+ Great Toe68 0.40 Abnormal   +---------+------------------+-----+----------+--------+ +---------+------------------+-----+----------+-------+ Left Lt Pressure (mmHg)IndexWaveform Comment +---------+------------------+-----+----------+-------+ Brachial 169     +---------+------------------+-----+----------+-------+ PTA >254 1.50 monophasic  +---------+------------------+-----+----------+-------+ DP >254 1.50 monophasic  +---------+------------------+-----+----------+-------+ Great Toe40 0.24 Abnormal   +---------+------------------+-----+----------+-------+ Pedal arteries appear hyperemic. Patient refused Brachial pressure in the right arm. Summary: Right: Resting right ankle-brachial index indicates noncompressible right lower extremity arteries. The right toe- brachial index is abnormal. Left: Resting left ankle-brachial index indicates noncompressible left lower extremity arteries. The left toe-brachial index is abnormal. He is  also having considerably more swelling in his left calf. This was not there when I last saw him 2 weeks ago. He tells me that some of the home health compression wraps have been slipping down and that may be the issue here however a month I am uncertain 51/0; sees vascular on December 22. Still complaining of a lot of pain. DVT study I did last time  was negative for DVT 12/14; still complaining of pain which if this is arterial is certainly claudication and rest keeps him uncomfortable at night. He has an appointment with vein and vascular on December 22. Wound surface is better using Iodoflex. Once the surface of this is satisfactory and we have exhausted the vascular route. Perhaps an advanced treatment option. He has a configuration of the venous ulceration although his arterial studies are not very good. The other issue is the patient has a transplanted kidney. This will make angiography difficult and challenging issue 12/21; still complaining of pain and drainage. We are using Iodoflex on the wound under compression. He sees Dr. Donnetta Hutching tomorrow to evaluate his noninvasive studies noted above. He has a transplanted kidney further complicating the options for angiography. 12/28; still using Iodoflex under compression. I have Dr. Luther Parody note from 12/22. he noted arterial studies revealing monophasic waveforms at the pedal vessels bilaterally and calcified vessels making the ABI unreliable. He did not comment on the reduced TBI's. He felt these were venous wounds based on the palpable dorsalis pedis pulse. 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 And they arranged for formal venous duplex with reflux studies in the next several weeks. Follow-up with either Scot Dock or Dr. Oneida Alar 09/24/2019 still using Iodoflex under compression. He has an appointment with Dr. Doren Custard on 1/7 with regards to his venous disease. The patient was not felt to have a primary arterial problem for the nonhealing of his wound. We did attempt to wrap him with 3 layer when he first came into the clinic he complained of a lot of pain in the ankle although this may have been because the dressing fell down somewhat. He has far too edema fluid in the right leg for a good prognosis about healing this wound He comes in today with an excoriation on the bottom part of his  right fifth toe. He thinks he may have done this taking off his clothes and that something got caught on the toe. There is no open wound however the toe itself is very painful 1/11; we are using Iodoflex under compression. The wound bed is clean. He went on to see Dr. Doren Custard on 09/27/2019 he again feels that the patient's arterial supply is adequate. He feels that he might benefit from right greater saphenous vein ablation for a venous ulcer. In the meantime the area that he was complaining about last week on the right fifth toe. An x-ray that I ordered showed marked soft tissue swelling along the distal aspect of the right foot but there was no evidence of osteomyelitis. He comes in today with the fifth toenail just about coming off. He has black eschar underneath the toe on the plantar aspect. The toe was swollen red and very painful. In the right setting this could be a significant soft tissue infection versus ischemia of the toe itself. He did not show this to Dr. Doren Custard 1/18; I am using Iodoflex under compression a large wound on the right posterior calf. He had his right greater saphenous vein ablation by Dr. Doren Custard although I  am not sure that is the only problem here. The right fifth toe which was possibly trauma 2 weeks ago continues to be exceptionally painful with a necrotic tip. Maybe not quite as swollen. I started him empirically on Augmentin 5/125 1 p.o. twice daily last week after discussing this with Dr. Loletha Grayer of nephrology. Perhaps somewhat better this week but not as good as I was hoping. A plain x-ray was negative. He comes in today with an area on the medial right calf that was blistered and now open. In looking at things he appears to be systemically fluid volume overloaded. He has a transplanted kidney. He states he takes his Lasix variably when he has appointments he tries not to take it the later I am really not certain if he takes this reliably. However he has far too much edema  fluid in the right leg to easily heal this wound and he appears to be developing blisters medially to form additional wounds Electronic Signature(s) Signed: 10/09/2019 4:08:35 PM By: [name] MD Entered By: [name] on 10/08/2019 11:00:04 -------------------------------------------------------------------------------- Physical Exam Details Patient Name: Date of Service: Michael, [name] 10/08/2019 9:15 AM Medical Record [id number] Patient Account Number: [id number] Date of Birth/Sex: Treating RN: [birth year] (74 y.o. M) Primary Care Provider: Donetta [name] Other Clinician: Referring Provider: Treating Provider/Extender:Leidi Astle, [name], [name] in Treatment: 9 Constitutional Patient is hypertensive.. Pulse regular and within target range for patient.Marland Kitchen Respirations regular, non-labored and within target range.. Temperature is normal and within the target range for the patient.Marland Kitchen Appears in no distress. Cardiovascular Occasional extra beats. No murmurs. JVP is elevated to the angle of the jaw. 2+ sacral pitting edema.. Has pitting edema extending into his upper thighs posteriorly. Musculoskeletal The right fifth toe remains swollen. Very tender. Necrotic surface at the tip. Notes Wound exam Right fifth toe. Necrotic at the tip and very tender although I think the swelling is better. Calf wound I do not think is much better. In fact it may measure longer. Electronic Signature(s) Signed: 10/09/2019 4:08:35 PM By: [name] MD Entered By: [name] on 10/08/2019 11:03:51 -------------------------------------------------------------------------------- Physician Orders Details Patient Name: Date of Service: ZAIDEN, LUDLUM 10/08/2019 9:15 AM Medical Record [id number] Patient Account Number: [id number] Date of Birth/Sex: Treating RN: 1946/02/12 (74 y.o. [name] Primary Care Provider: Donetta [name] Other  Clinician: Referring Provider: Treating Provider/Extender:Mikhail Hallenbeck, [name], [name] in Treatment: 9 Verbal / Phone Orders: No Diagnosis Coding ICD-10 Coding Code Description S80.11XD Contusion of right lower leg, subsequent encounter L97.212 Non-pressure chronic ulcer of right calf with fat layer exposed I87.321 Chronic venous hypertension (idiopathic) with inflammation of right lower extremity E11.622 Type 2 diabetes mellitus with other skin ulcer L97.518 Non-pressure chronic ulcer of other part of right foot with other specified severity Follow-up Appointments Return Appointment in 1 week. Dressing Change Frequency Change dressing three times week. - all wounds - 2x by home health on Wednesdays and Fridays, 1x by wound clinic on Mondays Skin Barriers/Peri-Wound Care Barrier cream Moisturizing lotion Wound Cleansing Clean wound with Normal Saline. - or normal saline on days that dressing is changed May shower with protection. Primary Wound Dressing Wound #3 Right,Posterior Lower Leg Iodoflex Wound #5 Right Toe Fifth Calcium Alginate with Silver Wound #6 Right,Medial Lower Leg Calcium Alginate with Silver - an weeping/blistered areas Wound #7 Right,Distal,Posterior Lower Leg Calcium Alginate with Silver - an weeping/blistered areas Secondary Dressing Wound #3 Right,Posterior Lower Leg Foam - foam top bend of foot for protection  ABD pad Wound #5 Right Toe Fifth Kerlix/Rolled Gauze - toe needs to be completely covered, including toenail Dry Gauze Wound #6 Right,Medial Lower Leg ABD pad Wound #7 Right,Distal,Posterior Lower Leg ABD pad Edema Control 3 Layer Compression System - Right Lower Extremity Avoid standing for long periods of time Elevate legs to the level of the heart or above for 30 minutes daily and/or when sitting, a frequency of: - throughout the day Exercise regularly Additional Orders / Instructions Other: - Go to ER if toe/leg gets more  red/black, increased pain, fever Other: - Make follow up appt with Kidney doctor regarding uncontrolled swelling [facility] for wound care. - [facility] Patient Medications Allergies: Elavil Notifications Medication Indication Start End Augmentin wound infection 10/08/2019 DOSE oral 500 mg-125 mg tablet - 1 tablet oral bid for a further 7 days (continuing rx) Electronic Signature(s) Signed: 10/08/2019 10:29:37 AM By: [name] MD Entered By: [name] on 10/08/2019 10:29:36 -------------------------------------------------------------------------------- Problem List Details Patient Name: Date of Service: Michael, [name] 10/08/2019 9:15 AM Medical Record [id number] Patient Account Number: [id number] Date of Birth/Sex: Treating RN: [birth year] (74 y.o. Jonette Eva, Briant Cedar Primary Care Provider: Donetta [name] Other Clinician: Referring Provider: Treating Provider/Extender:Eyoel Throgmorton, [name], [name] in Treatment: 9 Active Problems ICD-10 Evaluated Encounter Code Description Active Date Today Diagnosis S80.11XD Contusion of right lower leg, subsequent encounter 08/06/2019 No Yes L97.212 Non-pressure chronic ulcer of right calf with fat layer 08/06/2019 No Yes exposed I87.321 Chronic venous hypertension (idiopathic) with 08/06/2019 No Yes inflammation of right lower extremity E11.622 Type 2 diabetes mellitus with other skin ulcer 08/06/2019 No Yes L97.518 Non-pressure chronic ulcer of other part of right foot 10/01/2019 No Yes with other specified severity Inactive Problems Resolved Problems Electronic Signature(s) Signed: 10/09/2019 4:08:35 PM By: [name] MD Entered By: [name] on 10/08/2019 10:56:09 -------------------------------------------------------------------------------- Progress Note Details Patient Name: Date of Service: BIRDIE, BEVERIDGE 10/08/2019 9:15 AM Medical Record  [id number] Patient Account Number: [id number] Date of Birth/Sex: Treating RN: April 04, 1946 (74 y.o. M) Primary Care Provider: Donetta [name] Other Clinician: Referring Provider: Treating Provider/Extender:Georjean Toya, [name], [name] in Treatment: 9 Subjective History of Present Illness (HPI) [place] HPI Description: 74 year old gentleman who was recently seen by his nephrologist Dr. Donato [name], and noted to have a wound on his left lower extremity which was lacerated 2 months ago and now has reopened. The patient's left shin has a ulceration with some exudate but no evidence of infection and he was referred to [place] for further care as it was known that the patient has had some peripheral vascular disease in the past. Past medical history significant for chronic kidney disease, atrial fibrillation, diabetes mellitus,status post kidney transplant in 1983 and 2005, a week fistula graft placement, status post previous bowel surgery. 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 10/06/2015 -- x-ray of the left tibia and fibula shows no evidence of osteomyelitis. The patient has also had Doppler studies of his extremity and is awaiting the appointment with the vascular surgeon. We have not yet received these reports. 10/13/2015 -- lower extremity venous duplex reflux evaluation shows reflux in the left common femoral vein, left saphenofemoral junction and the proximal greater saphenous vein extending to the proximal calf. There is also reflux in the left proximal to mid small saphenous vein. Arterial duplex studies done showed the resting ABI was not applicable due to tibial artery medial calcification. The left  ABI was 0.8 using the Doppler dorsalis pedis indicating mild arterial occlusive disease at rest with the posterior tibial artery noted to be noncompressible. The right TBI was 1 which is normal and the left ABI was 1 which is  normal. Patient has otherwise been doing fine and has been compliant with his dressings. 10/20/2015 -- He was seen by Dr. Adele [name] recently for a vascular opinion on 10/15/2015. His left lower extremity venous insufficiency duplex study revealed GSV reflux,SS vein reflux and deep venous reflux in the common femoral vein. His ABIs were non compressible and his TBI on the right was 1.01 and on the left was 0.80. He was asked to continue with the wound care with compressive therapy followed by EVLA of the left GS vein 3 months. He recommended 20-30 mm thigh-high compression stockings and the need for a three-month trial of this. The patient had an Unna boot applied at the vascular office but he could not tolerate this with a lot of pain and issues with his toes and hence came here on Friday for removal of this and we reapplied a 2 layer compression. 11/10/2015 -- patient still has not purchased his 20-30 mm thigh-high compression stockings as prescribed by Dr. Bridgett [name]. Readmission: 08/08/18 on evaluation today patient presents for readmission concerning a new injury to the left anterior lower extremity. He was previously seen in 2017 here in our clinic. He states that he has done fairly well since that point. Nonetheless he is having at this time some pain but states that he hit this on a table that fell over and actually struck his leg. This appears to have pulled back some of his skin which folded in on itself and is causing some difficulty as far as that is concerned. There does not appear to be any evidence of infection at this time. No fevers, chills, nausea, or vomiting noted at this time. He's been using dressings on his own currently without complication. 08/15/18 on evaluation today patient actually appears to be doing somewhat better in regard to his wound of the lower [name] when compared to the first visit last week. I had to do a much more extensive debridement at that time it does  appear that I'm gonna have to perform some debridement today but it does not look to be as extensive by any means. Nonetheless fortunately he does not show any signs of infection he does have discomfort at this site. I believe based on what I'm seeing currently he may benefit from Iodoflex to help keep the wound bed clean. Patient tolerated therapy without complication. Upon evaluation today the patient actually appears to be doing excellent in regard to his left lower extremity ulcer. This is much better than the previous two visits where he had a lot of necrotic tissue around the edge of the wound simply due to the fact that again there was a significant skin tear where the edge had been cleared away prior to reattaching and being able to heal appropriately. He seems to be doing much better at this point. 08/28/18 on evaluation today the patient's wound actually does appear to be showing signs of improvement. With that being said though he is improving he would likely note even greater improvement if we were able to sharply debride the wound. Nonetheless this caused him to much discomfort he tells me. 09/04/18 on evaluation today patient actually appears to be showing signs of improvement in regard to his left lower extremity ulcer. He has been  tolerating the dressing changes including the wrap although he tells me at this point that the burning does last for a couple of days even with just the Iodoflex. I was afraid that this may been part of the issue that he was having with discomfort. It does seem to be the case. Nonetheless he shows no signs of evidence of infection at this time which is good news. No fevers chills noted ADMISSION to [name] 10/05/2018 This is a patient who was cared for in 2017 and again in the fall of this year at our sister clinic and [facility]. He actually lives in [place] in [place]. We have been dealing with an apparent traumatic area on the left  anterior tibial area. This is been present for the last several months. He was supposed to be using Iodoflex Kerlix and Coban however he was hospitalized from 09/05/2018 through 09/11/2018 with delirium secondary to pneumonia. Since then he is only been putting Vaseline gauze on this without compression. He also has a more recent skin tear on the dorsal right hand that may have only happened in the last week. The patient had arterial studies done in 2017 in January which was 3 years ago. At that point he had noncompressible ABIs but really quite good TBI's both normal. Triphasic waveforms on the right monophasic at the left posterior tibial but triphasic at the left dorsalis pedis. His ABIs in our clinic today were both noncompressible 1/23; the patient has wounds on the right dorsal hand just distal to the wrist and on the left anterior lower extremity. Both of these look very healthy he is using Hydrofera Blue 1/30; left anterior lower extremity wound much smaller. Healthy looking surface. The laceration area just distal to the wrist on the dorsal hand on the right is also just about closed I used Hydrofera Blue here 2/6; left anterior lower extremity wound is much smaller but still open. The laceration area just distal to the wrist on the dorsal hand is fully epithelialized. 2/13; the patient's anterior lower extremity wound is closed. The laceration just distal to the wrist on the dorsal hand is also fully epithelialized and closed. The patient has external compression stockings which I think are 20/30 READMISSION 08/06/2019 [name] is a 74 year old man with had several times previously in our clinic. He is a diabetic with a history of chronic renal insufficiency status post kidney transplant in 1983 and again in 2005. He was then in 2017 with a laceration on the left lower extremity. He was worked up at the time with arterial studies and reflux studies. The arterial studies showed ABIs  to be noncompressible but TBI's were within normal limits. I do not have the reflux studies at the moment. He was also sent here in 2019 with a left lower extremity wound and then again in 2020 with left lower extremity trauma a skin tear on the wrist. He was discharged with 20/30 stockings identified from myself that that might not be enough compression. Nevertheless he states he was wearing these fairly reliably. In September he had a fall with a substantial bruise in the area of the wound. He says he saw orthopedics and they told him there was some muscle strain sometime it later this opened into a wound. He has a fairly substantial wound on the right posterior calf. Satellite areas around this including medially and posteriorly. He has not worn his stockings since the injury Past medical history; includes chronic renal failure secondary  to diabetes with kidney transplant x2, atrial fibrillation, heart failure with preserved ejection fraction, coronary artery disease. ABIs on the right in our clinic were once again noncompressible 08/13/2019 on evaluation today patient appears to be doing decently well with regard to his wound compared to last week's evaluation. Unfortunately he is still having a lot of discomfort at this point which is I think in some part due to the 3 layer compression wrap which is a little bit stronger I think for him. When he was here before we actually utilized a Kerlix and Coban wrap which he states seemed to got a little bit better. Nonetheless I think we can probably drop back to this in light of the discomfort that he had. Nonetheless the pain was not really right around the wound itself as much as it was around the ankle in particular. The Iodoflex does seem to have done well for him As the wound is appearing somewhat better today which is excellent news. 11/30; still complaining of a lot of pain. Apparently arterial studies I ordered 2 weeks ago are below. I do not  believe we have an appointment with vein and vascular as of yet; ABI Findings: +---------+------------------+-----+----------+--------+ Right Rt Pressure (mmHg)IndexWaveform Comment  +---------+------------------+-----+----------+--------+ PTA >254 1.50 monophasic  +---------+------------------+-----+----------+--------+ DP >254 1.50 monophasic  +---------+------------------+-----+----------+--------+ Great Toe68 0.40 Abnormal   +---------+------------------+-----+----------+--------+ +---------+------------------+-----+----------+-------+ Left Lt Pressure (mmHg)IndexWaveform Comment +---------+------------------+-----+----------+-------+ Brachial 169     +---------+------------------+-----+----------+-------+ PTA >254 1.50 monophasic  +---------+------------------+-----+----------+-------+ DP >254 1.50 monophasic  +---------+------------------+-----+----------+-------+ Great Toe40 0.24 Abnormal   +---------+------------------+-----+----------+-------+ Pedal arteries appear hyperemic. Patient refused Brachial pressure in the right arm. Summary: Right: Resting right ankle-brachial index indicates noncompressible right lower extremity arteries. The right toe- brachial index is abnormal. Left: Resting left ankle-brachial index indicates noncompressible left lower extremity arteries. The left toe-brachial index is abnormal. He is also having considerably more swelling in his left calf. This was not there when I last saw him 2 weeks ago. He tells me that some of the home health compression wraps have been slipping down and that may be the issue here however a month I am uncertain 17/6; sees vascular on December 22. Still complaining of a lot of pain. DVT study I did last time was negative for DVT 12/14; still complaining of pain which if this is arterial is certainly claudication and rest keeps him uncomfortable at night. He has an  appointment with vein and vascular on December 22. Wound surface is better using Iodoflex. Once the surface of this is satisfactory and we have exhausted the vascular route. Perhaps an advanced treatment option. He has a configuration of the venous ulceration although his arterial studies are not very good. The other issue is the patient has a transplanted kidney. This will make angiography difficult and challenging issue 12/21; still complaining of pain and drainage. We are using Iodoflex on the wound under compression. He sees Dr. Donnetta Hutching tomorrow to evaluate his noninvasive studies noted above. He has a transplanted kidney further complicating the options for angiography. 12/28; still using Iodoflex under compression. I have Dr. Luther Parody note from 12/22. he noted arterial studies revealing monophasic waveforms at the pedal vessels bilaterally and calcified vessels making the ABI unreliable. He did not comment on the reduced TBI's. He felt these were venous wounds based on the palpable dorsalis pedis pulse. 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 And they arranged for formal venous duplex with reflux studies in the next several weeks. Follow-up with either Scot Dock or Dr. Oneida Alar  09/24/2019 still using Iodoflex under compression. He has an appointment with Dr. Doren Custard on 1/7 with regards to his venous disease. The patient was not felt to have a primary arterial problem for the nonhealing of his wound. We did attempt to wrap him with 3 layer when he first came into the clinic he complained of a lot of pain in the ankle although this may have been because the dressing fell down somewhat. He has far too edema fluid in the right leg for a good prognosis about healing this wound He comes in today with an excoriation on the bottom part of his right fifth toe. He thinks he may have done this taking off his clothes and that something got caught on the toe. There is no open wound however the toe itself  is very painful 1/11; we are using Iodoflex under compression. The wound bed is clean. He went on to see Dr. Doren Custard on 09/27/2019 he again feels that the patient's arterial supply is adequate. He feels that he might benefit from right greater saphenous vein ablation for a venous ulcer. In the meantime the area that he was complaining about last week on the right fifth toe. An x-ray that I ordered showed marked soft tissue swelling along the distal aspect of the right foot but there was no evidence of osteomyelitis. He comes in today with the fifth toenail just about coming off. He has black eschar underneath the toe on the plantar aspect. The toe was swollen red and very painful. In the right setting this could be a significant soft tissue infection versus ischemia of the toe itself. He did not show this to Dr. Doren Custard 1/18; I am using Iodoflex under compression a large wound on the right posterior calf. He had his right greater saphenous vein ablation by Dr. Doren Custard although I am not sure that is the only problem here. ooThe right fifth toe which was possibly trauma 2 weeks ago continues to be exceptionally painful with a necrotic tip. Maybe not quite as swollen. I started him empirically on Augmentin 5/125 1 p.o. twice daily last week after discussing this with Dr. Loletha Grayer of nephrology. Perhaps somewhat better this week but not as good as I was hoping. A plain x-ray was negative. He comes in today with an area on the medial right calf that was blistered and now open. In looking at things he appears to be systemically fluid volume overloaded. He has a transplanted kidney. He states he takes his Lasix variably when he has appointments he tries not to take it the later I am really not certain if he takes this reliably. However he has far too much edema fluid in the right leg to easily heal this wound and he appears to be developing blisters medially to form additional  wounds Objective Constitutional Patient is hypertensive.. Pulse regular and within target range for patient.Marland Kitchen Respirations regular, non-labored and within target range.. Temperature is normal and within the target range for the patient.Marland Kitchen Appears in no distress. Vitals Time Taken: 9:40 AM, Height: 67 in, Weight: 190 lbs, BMI: 29.8, Temperature: 98 F, Pulse: 67 bpm, Respiratory Rate: 19 breaths/min, Blood Pressure: 168/84 mmHg, Capillary Blood Glucose: 119 mg/dl. Cardiovascular Occasional extra beats. No murmurs. JVP is elevated to the angle of the jaw. 2+ sacral pitting edema.. Has pitting edema extending into his upper thighs posteriorly. Musculoskeletal The right fifth toe remains swollen. Very tender. Necrotic surface at the tip. General Notes: Wound exam ooRight fifth toe. Necrotic at  the tip and very tender although I think the swelling is better. ooCalf wound I do not think is much better. In fact it may measure longer. Integumentary (Hair, Skin) Wound #3 status is Open. Original cause of wound was Trauma. The wound is located on the Right,Posterior Lower Leg. The wound measures 16cm length x 5.5cm width x 0.4cm depth; 69.115cm^2 area and 27.646cm^3 volume. There is Fat Layer (Subcutaneous Tissue) Exposed exposed. There is no tunneling or undermining noted. There is a large amount of purulent drainage noted. The wound margin is well defined and not attached to the wound base. There is medium (34-66%) pink granulation within the wound bed. There is a medium (34-66%) amount of necrotic tissue within the wound bed including Adherent Slough. Wound #5 status is Open. Original cause of wound was Blister. The wound is located on the Right Toe Fifth. The wound measures 1.1cm length x 1cm width x 0.1cm depth; 0.864cm^2 area and 0.086cm^3 volume. There is no tunneling or undermining noted. There is a small amount of serous drainage noted. The wound margin is distinct with the outline attached  to the wound base. There is small (1-33%) pink granulation within the wound bed. There is a large (67-100%) amount of necrotic tissue within the wound bed including Eschar. Wound #6 status is Open. Original cause of wound was Blister. The wound is located on the Right,Medial Lower Leg. The wound measures 4.2cm length x 3.5cm width x 0.1cm depth; 11.545cm^2 area and 1.155cm^3 volume. There is Fat Layer (Subcutaneous Tissue) Exposed exposed. There is no tunneling or undermining noted. There is a small amount of serous drainage noted. The wound margin is distinct with the outline attached to the wound base. There is medium (34-66%) pink granulation within the wound bed. There is no necrotic tissue within the wound bed. Wound #7 status is Open. Original cause of wound was Blister. The wound is located on the Right,Distal,Posterior Lower Leg. The wound measures 1.5cm length x 2cm width x 0.1cm depth; 2.356cm^2 area and 0.236cm^3 volume. There is Fat Layer (Subcutaneous Tissue) Exposed exposed. There is no tunneling or undermining noted. There is a small amount of serous drainage noted. The wound margin is distinct with the outline attached to the wound base. There is medium (34-66%) pink granulation within the wound bed. There is no necrotic tissue within the wound bed. 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 There was a Three Layer Compression Therapy Procedure by [name], RN. Post procedure Diagnosis Wound #3: Same as Pre-Procedure Wound  #6 Pre-procedure diagnosis of Wound #6 is a Venous Leg Ulcer located on the Right,Medial Lower Leg . There was a Three Layer Compression Therapy Procedure by [name], RN. Post procedure Diagnosis Wound #6: Same as Pre-Procedure Wound #7 Pre-procedure diagnosis of Wound #7 is a Venous Leg Ulcer located on the Right,Distal,Posterior Lower Leg . There was a Three Layer Compression Therapy Procedure by [name], RN. Post procedure Diagnosis Wound #7: Same as Pre-Procedure Plan Follow-up Appointments: Return Appointment in 1 week. Dressing Change Frequency: Change dressing three times week. - all wounds - 2x by home health on  Wednesdays and Fridays, 1x by wound clinic on Mondays Skin Barriers/Peri-Wound Care: Barrier cream Moisturizing lotion Wound Cleansing: Clean wound with Normal Saline. - or normal saline on days that dressing is changed May shower with protection. Primary Wound Dressing: Wound #3 Right,Posterior Lower Leg: Iodoflex Wound #5 Right Toe Fifth: Calcium Alginate with Silver Wound #6 Right,Medial Lower Leg: Calcium Alginate with Silver - an weeping/blistered areas Wound #7 Right,Distal,Posterior Lower Leg: Calcium Alginate with Silver - an weeping/blistered areas Secondary Dressing: Wound #3 Right,Posterior Lower Leg: Foam - foam top bend of foot for protection ABD pad Wound #5 Right Toe Fifth: Kerlix/Rolled Gauze - toe needs to be completely covered, including toenail Dry Gauze Wound #6 Right,Medial Lower Leg: ABD pad Wound #7 Right,Distal,Posterior Lower Leg: ABD pad Edema Control: 3 Layer Compression System - Right Lower Extremity Avoid standing for long periods of time Elevate legs to the level of the heart or above for 30 minutes daily and/or when sitting, a frequency of: - throughout the day Exercise regularly Additional Orders / Instructions: Other: - Go to ER if toe/leg gets more red/black, increased pain, fever Other: - Make follow up  appt with Kidney doctor regarding uncontrolled swelling Home Health: [facility] for wound [facility]. - [facility] The following medication(s) was prescribed: Augmentin oral 500 mg-125 mg tablet 1 tablet oral bid for a further 7 days (continuing rx) for wound infection starting 10/08/2019 1. Still using Iodoflex to the wound on the calf. 2. I have extended his Augmentin 500/125 twice daily for 7 days still working under the premise that this is a wound infection although the exact cause of this was not clear. X-ray initially was negative. I do not believe this is any form of crystal arthropathy 3. I am still concerned about the patient's arterial status into the right foot. TBI's were markedly reduced. I have asked him to show this to Dr. Doren Custard when he sees him in follow-up and I will try to have him look at this 4. He is systemically fluid volume overloaded currently at 80 mg of Lasix a day. Also issued a note to nephrology and I have asked him to make a follow-up appointment. He does not appear to have a primary doctor. Electronic Signature(s) Signed: 10/09/2019 4:08:35 PM By: [name] MD Entered By: [name] on 10/08/2019 11:07:37 -------------------------------------------------------------------------------- SuperBill Details Patient Name: Date of Service: Michael, [name] 10/08/2019 Medical Record [id number] Patient Account Number: [id number] Date of Birth/Sex: Treating RN: [birth year] (73 y.o. [name] Primary Care Provider: Donetta [name] Other Clinician: Referring Provider: Treating Provider/Extender:Refoel Palladino, [name], [name] in Treatment: 9 Diagnosis Coding ICD-10 Codes Code Description S80.11XD Contusion of right lower leg, subsequent encounter L97.212 Non-pressure chronic ulcer of right calf with fat layer exposed I87.321 Chronic venous hypertension (idiopathic) with inflammation of right lower  extremity E11.622 Type 2 diabetes mellitus with other skin ulcer L97.518 Non-pressure chronic ulcer of other part of right foot with other specified severity Facility Procedures CPT4 Code Description: [id number] (Facility Use Only) [phone] - APPLY MULTLAY COMPRS LWR RT LEG Modifier: Quantity: 1 Physician Procedures CPT4 Code Description: [id number] - WC PHYS LEVEL 4 - EST PT ICD-10 Diagnosis Description L97.212 Non-pressure chronic ulcer of right calf with fat layer L97.518 Non-pressure chronic ulcer of other part of right foot w I87.321 Chronic venous  hypertension (idiopathic) with inflammati extremity S80.11XD Contusion of right lower leg, subsequent encounter Modifier: exposed ith other specifi on of right lower Quantity: 1 ed severity Electronic  Signature(s) Signed: 10/09/2019 4:08:35 PM By: [name] MD Entered By: [name] on 10/08/2019 11:08:03

## 2019-10-10 ENCOUNTER — Telehealth (HOSPITAL_COMMUNITY): Payer: Self-pay

## 2019-10-10 NOTE — Telephone Encounter (Signed)

## 2019-10-11 ENCOUNTER — Encounter: Payer: Self-pay | Admitting: Vascular Surgery

## 2019-10-11 ENCOUNTER — Other Ambulatory Visit: Payer: Self-pay

## 2019-10-11 ENCOUNTER — Ambulatory Visit (INDEPENDENT_AMBULATORY_CARE_PROVIDER_SITE_OTHER): Payer: Medicare Other | Admitting: Vascular Surgery

## 2019-10-11 ENCOUNTER — Other Ambulatory Visit (HOSPITAL_COMMUNITY)
Admission: RE | Admit: 2019-10-11 | Discharge: 2019-10-11 | Disposition: A | Discharge status: Home / Self Care | Payer: Medicare Other | Source: Ambulatory Visit | Attending: Vascular Surgery | Admitting: Vascular Surgery

## 2019-10-11 ENCOUNTER — Ambulatory Visit (HOSPITAL_COMMUNITY)
Admission: RE | Admit: 2019-10-11 | Discharge: 2019-10-11 | Disposition: A | Discharge status: Home / Self Care | Payer: Medicare Other | Source: Ambulatory Visit | Attending: Vascular Surgery | Admitting: Vascular Surgery

## 2019-10-11 VITALS — BP 139/75 | HR 69 | Temp 98.7°F | Resp 18 | Ht 68.0 in | Wt 190.0 lb

## 2019-10-11 DIAGNOSIS — I83012 Varicose veins of right lower extremity with ulcer of calf: Secondary | ICD-10-CM

## 2019-10-11 DIAGNOSIS — L97219 Non-pressure chronic ulcer of right calf with unspecified severity: Secondary | ICD-10-CM | POA: Diagnosis not present

## 2019-10-11 DIAGNOSIS — L97919 Non-pressure chronic ulcer of unspecified part of right lower leg with unspecified severity: Secondary | ICD-10-CM | POA: Diagnosis not present

## 2019-10-11 DIAGNOSIS — I872 Venous insufficiency (chronic) (peripheral): Secondary | ICD-10-CM

## 2019-10-11 DIAGNOSIS — Z20822 Contact with and (suspected) exposure to covid-19: Secondary | ICD-10-CM | POA: Insufficient documentation

## 2019-10-11 DIAGNOSIS — I83019 Varicose veins of right lower extremity with ulcer of unspecified site: Secondary | ICD-10-CM

## 2019-10-11 DIAGNOSIS — I70261 Atherosclerosis of native arteries of extremities with gangrene, right leg: Secondary | ICD-10-CM | POA: Diagnosis not present

## 2019-10-11 DIAGNOSIS — Z01818 Encounter for other preprocedural examination: Secondary | ICD-10-CM | POA: Diagnosis not present

## 2019-10-11 LAB — SARS CORONAVIRUS 2 (TAT 6-24 HRS): SARS Coronavirus 2: NEGATIVE

## 2019-10-11 NOTE — Progress Notes (Signed)
Patient name: [name] MRN: [id number] DOB: [birth year] Sex: male  REASON FOR VISIT: Follow-up after endovenous laser ablation of the right great saphenous vein.   HPI: [name] is a 74 y.o. male who was seen by Dr. Sherren Mocha early on 09/11/2019 with an extensive venous ulcer of his right leg in the posterior calf.  On his exam he had a palpable dorsalis pedis pulse.  He was noted to have significant reflux in the right great saphenous vein and was set up for laser ablation of the right great saphenous vein.  He underwent the procedure on 10/04/2019.  Of note when he presented for the procedure I noted that he had some dry gangrene on the tip of his right fifth toe.  On 10/04/2019 he had laser ablation of the right great saphenous vein from 2-1/2 cm distal to the saphenofemoral junction to just above the knee.  He comes in for his 1 week follow-up visit.  He has no specific complaints.  He has an extensive wound on the posterior aspect of his right calf as documented in my note on 09/27/2019.  He has a compression dressing on today and this is changed 3 times a week by the wound care center.  SYMPTOMS: His aching pain in the right leg has improved.  CONSERVATIVE TREATMENT: He continues with compression therapy for his wound on the right leg and has been elevating his legs.  VENOUS INTERVENTIONS: No other interventions in addition to the above-mentioned procedure.  Current Outpatient Medications  Medication Sig Dispense Refill  . acetaminophen (TYLENOL) 500 MG tablet Take 1,000 mg by mouth every 8 (eight) hours as needed for moderate pain.     . Alcohol Swabs (B-D SINGLE USE SWABS REGULAR) PADS Use as directed    . aspirin 81 MG tablet Take 81 mg by mouth daily.      Marland Kitchen atorvastatin (LIPITOR) 10 MG tablet Take 10 mg by mouth daily.    Marland Kitchen BAYER CONTOUR NEXT TEST test strip Use as directed    . BD PEN NEEDLE NANO U/F 32G X 4 MM MISC Use as directed    . calcitRIOL (ROCALTROL) 0.25 MCG  capsule Take 0.25 mcg by mouth daily. daily    . carvedilol (COREG) 6.25 MG tablet TAKE 1 TABLET TWICE A DAY WITH MEALS (PLEASE CALL [phone] TO SET UP YEARLY APPOINTMENT WITH DR Tamala Julian FOR FURTHER REFILLS. THANK YOU) 180 tablet 3  . diphenhydrAMINE (BENADRYL) 25 mg capsule Take 1 capsule (25 mg total) by mouth at bedtime as needed (for sleep/allergies.). 30 capsule 0  . furosemide (LASIX) 40 MG tablet Take 80 mg by mouth daily. Fluid. 30 tablet   . gabapentin (NEURONTIN) 100 MG capsule Take 100 mg by mouth 3 (three) times daily as needed (for nerve pain). Take three times/day as needed    . HUMALOG KWIKPEN 100 UNIT/ML KwikPen Inject 3-5 Units into the skin 3 (three) times daily.  6  . hydrALAZINE (APRESOLINE) 25 MG tablet Take 25 mg by mouth 3 (three) times daily.   4  . LANTUS SOLOSTAR 100 UNIT/ML Solostar Pen Inject 7-15 Units into the skin daily. If blood sugars are >300, use 15 units, <300 use 7 units    . latanoprost (XALATAN) 0.005 % ophthalmic solution Place 1 drop into both eyes at bedtime.    Marland Kitchen linagliptin (TRADJENTA) 5 MG TABS tablet Take 5 mg by mouth daily.      Marland Kitchen LORazepam (ATIVAN) 1 MG tablet Take 1 tablet prior to  leaving house on day of procedure. Bring second tablet with you to office on day of procedure. 2 tablet 0  . MICROLET LANCETS MISC Use as directed    . mycophenolate (CELLCEPT) 500 MG tablet Take 500 mg by mouth 2 (two) times daily.     . predniSONE (DELTASONE) 5 MG tablet Take 5 mg by mouth daily.  2  . tacrolimus (PROGRAF) 1 MG capsule Take 1 mg by mouth 2 (two) times daily.     Marland Kitchen VASCEPA 1 g CAPS Take 4 g by mouth daily.     No current facility-administered medications for this visit.   REVIEW OF SYSTEMS: Valu.Nieves ] denotes positive finding; [  ] denotes negative finding  CARDIOVASCULAR:  [ ]  chest pain   [ ]  dyspnea on exertion  [ ]  leg swelling  CONSTITUTIONAL:  [ ]  fever   [ ]  chills  PHYSICAL EXAM: Vitals:   10/11/19 1041  BP: 139/75  Pulse: 69  Resp: 18    Temp: 98.7 F (37.1 C)  TempSrc: Temporal  SpO2: 97%  Weight: 190 lb (86.2 kg)  Height: 5\' 8"  (1.727 m)   GENERAL: The patient is a well-nourished male, in no acute distress. The vital signs are documented above. CARDIOVASCULAR: There is a regular rate and rhythm. PULMONARY: There is good air exchange bilaterally without wheezing or rales. VASCULAR: He has palpable femoral pulses. He has a compression dressing on his right leg so I cannot assess his pulses in the right foot as the dressing covers the foot. He has dry gangrene of the tip of the right fifth toe.  DATA:  VENOUS DUPLEX:  have independently interpreted his venous duplex scan today which shows no evidence of DVT.  The right great saphenous vein was successfully closed from 1.1 cm distal to the saphenofemoral junction to just above the knee.  ARTERIAL DOPPLER STUDY: I reviewed the arterial Doppler study that was done in November 2020.  On the right side he had a monophasic dorsalis pedis and posterior tibial signal.  ABI was greater than 1 and likely not accurate given his calcific disease.  Toe pressure on the right was 68 mmHg.  On the left side monophasic dorsalis pedis and posterior tibial signal.  ABI was greater than 100% although again likely not accurate.  Toe pressure on the left was 40 mmHg.  MEDICAL ISSUES:  CHRONIC VENOUS INSUFFICIENCY: This patient has successfully undergone laser ablation of the right great saphenous vein which should lower his venous hypertension and facilitate healing of his venous ulcer of the right calf which is quite extensive.  He will continue with leg elevation and mild compression therapy.  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  Given the extensive wound of the right calf and also now with dry gangrene of the tip of the right fifth toe I have recommended that we proceed with arteriography to further assess his  circulation and see if he is a candidate for revascularization.  We have discussed the indications for the procedure and the potential complications.  He has a functioning renal transplant.  I do not have any recent labs.  If we have any concerned about his labs prior to the test then we would likely use CO2 with limited contrast.  I will make further recommendations pending the results of his arteriogram which is scheduled for tomorrow, 10/12/2019.  [name] [facility] [place] [phone]

## 2019-10-11 NOTE — H&P (View-Only) (Signed)
Patient name: [name] MRN: [id number] DOB: November 18, 1945 Sex: male  REASON FOR VISIT: Follow-up after endovenous laser ablation of the right great saphenous vein.   HPI: [name] is a 74 y.o. male who was seen by Dr. Sherren Mocha early on 09/11/2019 with an extensive venous ulcer of his right leg in the posterior calf.  On his exam he had a palpable dorsalis pedis pulse.  He was noted to have significant reflux in the right great saphenous vein and was set up for laser ablation of the right great saphenous vein.  He underwent the procedure on 10/04/2019.  Of note when he presented for the procedure I noted that he had some dry gangrene on the tip of his right fifth toe.  On 10/04/2019 he had laser ablation of the right great saphenous vein from 2-1/2 cm distal to the saphenofemoral junction to just above the knee.  He comes in for his 1 week follow-up visit.  He has no specific complaints.  He has an extensive wound on the posterior aspect of his right calf as documented in my note on 09/27/2019.  He has a compression dressing on today and this is changed 3 times a week by the wound care center.  SYMPTOMS: His aching pain in the right leg has improved.  CONSERVATIVE TREATMENT: He continues with compression therapy for his wound on the right leg and has been elevating his legs.  VENOUS INTERVENTIONS: No other interventions in addition to the above-mentioned procedure.  Current Outpatient Medications  Medication Sig Dispense Refill  . acetaminophen (TYLENOL) 500 MG tablet Take 1,000 mg by mouth every 8 (eight) hours as needed for moderate pain.     . Alcohol Swabs (B-D SINGLE USE SWABS REGULAR) PADS Use as directed    . aspirin 81 MG tablet Take 81 mg by mouth daily.      Marland Kitchen atorvastatin (LIPITOR) 10 MG tablet Take 10 mg by mouth daily.    Marland Kitchen BAYER CONTOUR NEXT TEST test strip Use as directed    . BD PEN NEEDLE NANO U/F 32G X 4 MM MISC Use as directed    . calcitRIOL (ROCALTROL) 0.25 MCG  capsule Take 0.25 mcg by mouth daily. daily    . carvedilol (COREG) 6.25 MG tablet TAKE 1 TABLET TWICE A DAY WITH MEALS (PLEASE CALL [phone] TO SET UP YEARLY APPOINTMENT WITH DR Tamala Julian FOR FURTHER REFILLS. THANK YOU) 180 tablet 3  . diphenhydrAMINE (BENADRYL) 25 mg capsule Take 1 capsule (25 mg total) by mouth at bedtime as needed (for sleep/allergies.). 30 capsule 0  . furosemide (LASIX) 40 MG tablet Take 80 mg by mouth daily. Fluid. 30 tablet   . gabapentin (NEURONTIN) 100 MG capsule Take 100 mg by mouth 3 (three) times daily as needed (for nerve pain). Take three times/day as needed    . HUMALOG KWIKPEN 100 UNIT/ML KwikPen Inject 3-5 Units into the skin 3 (three) times daily.  6  . hydrALAZINE (APRESOLINE) 25 MG tablet Take 25 mg by mouth 3 (three) times daily.   4  . LANTUS SOLOSTAR 100 UNIT/ML Solostar Pen Inject 7-15 Units into the skin daily. If blood sugars are >300, use 15 units, <300 use 7 units    . latanoprost (XALATAN) 0.005 % ophthalmic solution Place 1 drop into both eyes at bedtime.    Marland Kitchen linagliptin (TRADJENTA) 5 MG TABS tablet Take 5 mg by mouth daily.      Marland Kitchen LORazepam (ATIVAN) 1 MG tablet Take 1 tablet prior to  leaving house on day of procedure. Bring second tablet with you to office on day of procedure. 2 tablet 0  . MICROLET LANCETS MISC Use as directed    . mycophenolate (CELLCEPT) 500 MG tablet Take 500 mg by mouth 2 (two) times daily.     . predniSONE (DELTASONE) 5 MG tablet Take 5 mg by mouth daily.  2  . tacrolimus (PROGRAF) 1 MG capsule Take 1 mg by mouth 2 (two) times daily.     Marland Kitchen VASCEPA 1 g CAPS Take 4 g by mouth daily.     No current facility-administered medications for this visit.   REVIEW OF SYSTEMS: Valu.Nieves ] denotes positive finding; [  ] denotes negative finding  CARDIOVASCULAR:  [ ]  chest pain   [ ]  dyspnea on exertion  [ ]  leg swelling  CONSTITUTIONAL:  [ ]  fever   [ ]  chills  PHYSICAL EXAM: Vitals:   10/11/19 1041  BP: 139/75  Pulse: 69  Resp: 18   Temp: 98.7 F (37.1 C)  TempSrc: Temporal  SpO2: 97%  Weight: 190 lb (86.2 kg)  Height: 5\' 8"  (1.727 m)   GENERAL: The patient is a well-nourished male, in no acute distress. The vital signs are documented above. CARDIOVASCULAR: There is a regular rate and rhythm. PULMONARY: There is good air exchange bilaterally without wheezing or rales. VASCULAR: He has palpable femoral pulses. He has a compression dressing on his right leg so I cannot assess his pulses in the right foot as the dressing covers the foot. He has dry gangrene of the tip of the right fifth toe.  DATA:  VENOUS DUPLEX:  have independently interpreted his venous duplex scan today which shows no evidence of DVT.  The right great saphenous vein was successfully closed from 1.1 cm distal to the saphenofemoral junction to just above the knee.  ARTERIAL DOPPLER STUDY: I reviewed the arterial Doppler study that was done in November 2020.  On the right side he had a monophasic dorsalis pedis and posterior tibial signal.  ABI was greater than 1 and likely not accurate given his calcific disease.  Toe pressure on the right was 68 mmHg.  On the left side monophasic dorsalis pedis and posterior tibial signal.  ABI was greater than 100% although again likely not accurate.  Toe pressure on the left was 40 mmHg.  MEDICAL ISSUES:  CHRONIC VENOUS INSUFFICIENCY: This patient has successfully undergone laser ablation of the right great saphenous vein which should lower his venous hypertension and facilitate healing of his venous ulcer of the right calf which is quite extensive.  He will continue with leg elevation and mild compression therapy.  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  Given the extensive wound of the right calf and also now with dry gangrene of the tip of the right fifth toe I have recommended that we proceed with arteriography to further assess his  circulation and see if he is a candidate for revascularization.  We have discussed the indications for the procedure and the potential complications.  He has a functioning renal transplant.  I do not have any recent labs.  If we have any concerned about his labs prior to the test then we would likely use CO2 with limited contrast.  I will make further recommendations pending the results of his arteriogram which is scheduled for tomorrow, 10/12/2019.  [name] [facility] [place] [phone]

## 2019-10-12 ENCOUNTER — Ambulatory Visit (HOSPITAL_COMMUNITY)
Admission: RE | Admit: 2019-10-12 | Discharge: 2019-10-12 | Disposition: A | Discharge status: Home / Self Care | Payer: Medicare Other | Attending: Vascular Surgery | Admitting: Vascular Surgery

## 2019-10-12 ENCOUNTER — Ambulatory Visit (HOSPITAL_COMMUNITY)
Admission: RE | Disposition: A | Discharge status: Home / Self Care | Payer: Self-pay | Source: Home / Self Care | Attending: Vascular Surgery

## 2019-10-12 DIAGNOSIS — Z794 Long term (current) use of insulin: Secondary | ICD-10-CM | POA: Diagnosis not present

## 2019-10-12 DIAGNOSIS — L97219 Non-pressure chronic ulcer of right calf with unspecified severity: Secondary | ICD-10-CM | POA: Insufficient documentation

## 2019-10-12 DIAGNOSIS — Z7952 Long term (current) use of systemic steroids: Secondary | ICD-10-CM | POA: Diagnosis not present

## 2019-10-12 DIAGNOSIS — I96 Gangrene, not elsewhere classified: Secondary | ICD-10-CM | POA: Insufficient documentation

## 2019-10-12 DIAGNOSIS — Z79899 Other long term (current) drug therapy: Secondary | ICD-10-CM | POA: Insufficient documentation

## 2019-10-12 DIAGNOSIS — I70232 Atherosclerosis of native arteries of right leg with ulceration of calf: Secondary | ICD-10-CM | POA: Diagnosis not present

## 2019-10-12 DIAGNOSIS — I83012 Varicose veins of right lower extremity with ulcer of calf: Secondary | ICD-10-CM | POA: Diagnosis not present

## 2019-10-12 DIAGNOSIS — Z940 Kidney transplant status: Secondary | ICD-10-CM | POA: Insufficient documentation

## 2019-10-12 DIAGNOSIS — Z7982 Long term (current) use of aspirin: Secondary | ICD-10-CM | POA: Insufficient documentation

## 2019-10-12 HISTORY — PX: ABDOMINAL AORTOGRAM W/LOWER EXTREMITY: CATH118223

## 2019-10-12 LAB — POCT I-STAT, CHEM 8
BUN: 68 mg/dL — ABNORMAL HIGH (ref 8–23)
Calcium, Ion: 1.29 mmol/L (ref 1.15–1.40)
Chloride: 113 mmol/L — ABNORMAL HIGH (ref 98–111)
Creatinine, Ser: 3.3 mg/dL — ABNORMAL HIGH (ref 0.61–1.24)
Glucose, Bld: 108 mg/dL — ABNORMAL HIGH (ref 70–99)
HCT: 30 % — ABNORMAL LOW (ref 39.0–52.0)
Hemoglobin: 10.2 g/dL — ABNORMAL LOW (ref 13.0–17.0)
Potassium: 3.9 mmol/L (ref 3.5–5.1)
Sodium: 142 mmol/L (ref 135–145)
TCO2: 19 mmol/L — ABNORMAL LOW (ref 22–32)

## 2019-10-12 SURGERY — ABDOMINAL AORTOGRAM W/LOWER EXTREMITY
Anesthesia: LOCAL

## 2019-10-12 MED ORDER — HEPARIN (PORCINE) IN NACL 1000-0.9 UT/500ML-% IV SOLN
INTRAVENOUS | Status: AC
  Filled 2019-10-12: qty 1000

## 2019-10-12 MED ORDER — ONDANSETRON HCL 4 MG/2ML IJ SOLN
INTRAMUSCULAR | Status: AC
  Filled 2019-10-12: qty 2

## 2019-10-12 MED ORDER — HEPARIN (PORCINE) IN NACL 1000-0.9 UT/500ML-% IV SOLN
INTRAVENOUS | Status: DC | PRN
  Administered 2019-10-12 (×2): 500 mL

## 2019-10-12 MED ORDER — SODIUM CHLORIDE 0.9 % IV SOLN: INTRAVENOUS | Status: DC

## 2019-10-12 MED ORDER — SODIUM CHLORIDE 0.9% FLUSH: 3.0000 mL | Freq: Two times a day (BID) | INTRAVENOUS | Status: DC

## 2019-10-12 MED ORDER — LABETALOL HCL 5 MG/ML IV SOLN: 10.0000 mg | INTRAVENOUS | Status: DC | PRN

## 2019-10-12 MED ORDER — MIDAZOLAM HCL 2 MG/2ML IJ SOLN
INTRAMUSCULAR | Status: DC | PRN
  Administered 2019-10-12: 1 mg via INTRAVENOUS

## 2019-10-12 MED ORDER — LIDOCAINE HCL (PF) 1 % IJ SOLN
INTRAMUSCULAR | Status: DC | PRN
  Administered 2019-10-12: 15 mL via INTRADERMAL

## 2019-10-12 MED ORDER — ACETAMINOPHEN 325 MG PO TABS
ORAL_TABLET | ORAL | Status: AC
  Filled 2019-10-12: qty 2

## 2019-10-12 MED ORDER — FENTANYL CITRATE (PF) 100 MCG/2ML IJ SOLN
INTRAMUSCULAR | Status: AC
  Filled 2019-10-12: qty 2

## 2019-10-12 MED ORDER — SODIUM CHLORIDE 0.9% FLUSH: 3.0000 mL | INTRAVENOUS | Status: DC | PRN

## 2019-10-12 MED ORDER — HYDRALAZINE HCL 20 MG/ML IJ SOLN: 5.0000 mg | INTRAMUSCULAR | Status: DC | PRN

## 2019-10-12 MED ORDER — LIDOCAINE HCL (PF) 1 % IJ SOLN
INTRAMUSCULAR | Status: AC
  Filled 2019-10-12: qty 30

## 2019-10-12 MED ORDER — MIDAZOLAM HCL 2 MG/2ML IJ SOLN
INTRAMUSCULAR | Status: AC
  Filled 2019-10-12: qty 2

## 2019-10-12 MED ORDER — ONDANSETRON HCL 4 MG/2ML IJ SOLN: 4.0000 mg | Freq: Four times a day (QID) | INTRAMUSCULAR | Status: DC | PRN

## 2019-10-12 MED ORDER — FENTANYL CITRATE (PF) 100 MCG/2ML IJ SOLN
INTRAMUSCULAR | Status: DC | PRN
  Administered 2019-10-12 (×2): 50 ug via INTRAVENOUS

## 2019-10-12 MED ORDER — SODIUM CHLORIDE 0.9 % IV SOLN: 250.0000 mL | INTRAVENOUS | Status: DC | PRN

## 2019-10-12 MED ORDER — ACETAMINOPHEN 325 MG PO TABS
650.0000 mg | ORAL_TABLET | ORAL | Status: DC | PRN
  Administered 2019-10-12: 650 mg via ORAL

## 2019-10-12 SURGICAL SUPPLY — 16 items
CATH ANGIO 5F PIGTAIL 65CM (CATHETERS) ×1 IMPLANT
CATH CROSS OVER TEMPO 5F (CATHETERS) ×1 IMPLANT
CATH STRAIGHT 5FR 65CM (CATHETERS) ×1 IMPLANT
CATH TEMPO AQUA 5F 100CM (CATHETERS) ×1 IMPLANT
CLOSURE MYNX CONTROL 5F (Vascular Products) ×1 IMPLANT
FILTER CO2 0.2 MICRON (VASCULAR PRODUCTS) ×1 IMPLANT
KIT MICROPUNCTURE NIT STIFF (SHEATH) ×1 IMPLANT
KIT PV (KITS) ×2 IMPLANT
RESERVOIR CO2 (VASCULAR PRODUCTS) ×1 IMPLANT
SET FLUSH CO2 (MISCELLANEOUS) ×1 IMPLANT
SHEATH PINNACLE 5F 10CM (SHEATH) ×1 IMPLANT
SHEATH PROBE COVER 6X72 (BAG) ×1 IMPLANT
TRANSDUCER W/STOPCOCK (MISCELLANEOUS) ×2 IMPLANT
TRAY PV CATH (CUSTOM PROCEDURE TRAY) ×2 IMPLANT
WIRE BENTSON .035X145CM (WIRE) ×1 IMPLANT
WIRE HI TORQ VERSACORE J 260CM (WIRE) ×1 IMPLANT

## 2019-10-12 NOTE — Interval H&P Note (Signed)
History and Physical Interval Note:  10/12/2019 9:14 AM  [name]  has presented today for surgery, with the diagnosis of peripheral vascular disease.  The various methods of treatment have been discussed with the patient and family. After consideration of risks, benefits and other options for treatment, the patient has consented to  Procedure(s): ABDOMINAL AORTOGRAM W/LOWER EXTREMITY (N/A) as a surgical intervention.  The patient's history has been reviewed, patient examined, no change in status, stable for surgery.  I have reviewed the patient's chart and labs.  Questions were answered to the patient's satisfaction.     [name]

## 2019-10-12 NOTE — Discharge Instructions (Signed)
Apply pressure to site when coughing and sneezing for 24 hours. Hold pressure and call [phone] if site starts to bleed or swell  Femoral Site Care This sheet gives you information about how to care for yourself after your procedure. Your health care provider may also give you more specific instructions. If you have problems or questions, contact your health care provider. What can I expect after the procedure? After the procedure, it is common to have:  Bruising that usually fades within 1-2 weeks.  Tenderness at the site. Follow these instructions at home: Wound care  Follow instructions from your health care provider about how to take care of your insertion site. Make sure you: ? Wash your hands with soap and water before you change your bandage (dressing). If soap and water are not available, use hand sanitizer. ? Remove your dressing as told by your health care provider. In 24-48 hours  Do not take baths, swim, or use a hot tub until your health care provider approves.  You may shower 24-48 hours after the procedure or as told by your health care provider. ? Gently wash the site with plain soap and water. ? Pat the area dry with a clean towel. ? Do not rub the site. This may cause bleeding.  Do not apply powder or lotion to the site. Keep the site clean and dry.  Check your femoral site every day for signs of infection. Check for: ? Redness, swelling, or pain. ? Fluid or blood. ? Warmth. ? Pus or a bad smell. Activity  For the first 2-3 days after your procedure, or as long as directed: ? Avoid climbing stairs as much as possible. ? Do not squat.  Do not lift anything that is heavier than 10 lb (4.5 kg), or the limit that you are told, until your health care provider says that it is safe. For 5 days  Rest as directed. ? Avoid sitting for a long time without moving. Get up to take short walks every 1-2 hours.  Do not drive for 24 hours if you were given a medicine to help  you relax (sedative). General instructions  Take over-the-counter and prescription medicines only as told by your health care provider.  Keep all follow-up visits as told by your health care provider. This is important. Contact a health care provider if you have:  A fever or chills.  You have redness, swelling, or pain around your insertion site. Get help right away if:  The catheter insertion area swells very fast.  You pass out.  You suddenly start to sweat or your skin gets clammy.  The catheter insertion area is bleeding, and the bleeding does not stop when you hold steady pressure on the area.  The area near or just beyond the catheter insertion site becomes pale, cool, tingly, or numb. These symptoms may represent a serious problem that is an emergency. Do not wait to see if the symptoms will go away. Get medical help right away. Call your local emergency services (911 in the [place] Do not drive yourself to the hospital. Summary  After the procedure, it is common to have bruising that usually fades within 1-2 weeks.  Check your femoral site every day for signs of infection.  Do not lift anything that is heavier than 10 lb (4.5 kg), or the limit that you are told, until your health care provider says that it is safe. This information is not intended to replace advice given to you by  your health care provider. Make sure you discuss any questions you have with your health care provider. Document Revised: 09/19/2017 Document Reviewed: 09/19/2017 Elsevier Patient Education  2020 [employer].

## 2019-10-12 NOTE — Op Note (Signed)
   PATIENT: [name]      MRN: [id number] DOB: 02/22/1946    DATE OF PROCEDURE: 10/12/2019  INDICATIONS:    [name] is a 74 y.o. male with an extensive venous stasis ulcer of the right leg.  He has undergone successful laser ablation of the right great saphenous vein.  He developed dry gangrene on the tip of his right fifth toe.  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  He has a functioning cadaveric renal transplant with elevated creatinine I elected to not use any contrast in a risk of compromising his kidney transplant.  PROCEDURE:    1.  Ultrasound-guided access to the left common femoral artery 2.  CO2 aortogram and bilateral iliac arteriogram 3.  Selective catheterization of the right external iliac artery with right lower extremity runoff (second order catheterization) 4.  Selective catheterization of the right superficial femoral artery with right lower extremity runoff (third order catheterization)  SURGEON: Judeth Cornfield. Scot Dock, MD, FACS  ANESTHESIA: Local with sedation  EBL: Minimal  TECHNIQUE: The patient was taken to the peripheral vascular lab and was sedated.The period of conscious sedation was 42 minutes.  During that time period, I was present face-to-face 100% of the time.  The patient was administered 1 mg of Versed and 100 mcg of fentanyl. The patient's heart rate, blood pressure, and oxygen saturation were monitored by the nurse continuously during the procedure.  Both groins were prepped and draped in the usual sterile fashion.  Under ultrasound guidance, after the skin was anesthetized, I cannulated the left common femoral artery with a micropuncture needle and a micropuncture sheath was introduced over a wire.  This was exchanged for a 5 [place] sheath over a Bentson wire.  By ultrasound the femoral artery was patent. A real-time image was obtained and sent to the server.   Pigtail catheter was positioned at the L1 vertebral body.  Flush aortogram was obtained using CO2.  The catheter was in position above the aortic bifurcation and exchanged for a crossover catheter which was positioned into the right common iliac artery.  I then advanced a wire down into the external iliac artery and a straight catheter was placed.  Selective right external iliac arteriogram was obtained.  I used all CO2 given the patient's history of a cadaveric renal transplant with marginal renal function.  I did not want use any contrast that might compromise his renal function.  I therefore advanced the wire into the superficial femoral artery and advanced a long straight catheter into the superficial femoral artery for better visualization.  Selective shots with CO2 of the right lower extremity were obtained.  FINDINGS:   1.  The infrarenal aorta, bilateral common iliac arteries, bilateral external iliac arteries, and bilateral hypogastric arteries are patent. 2.  On the right side the common femoral, deep femoral, superficial femoral, popliteal, anterior tibial, and peroneal arteries are patent.  The posterior tibial artery is occluded proximally but reconstitutes distally via collaterals from the peroneal artery.  CLINICAL NOTE: The patient should have adequate circulation to heal his venous stasis ulcer.  Michael Mayo, MD, FACS [facility]  DATE OF DICTATION:   10/12/2019

## 2019-10-15 ENCOUNTER — Encounter (HOSPITAL_BASED_OUTPATIENT_CLINIC_OR_DEPARTMENT_OTHER): Payer: Medicare Other | Attending: Internal Medicine | Admitting: Internal Medicine

## 2019-10-15 ENCOUNTER — Other Ambulatory Visit: Payer: Self-pay

## 2019-10-15 DIAGNOSIS — S8011XD Contusion of right lower leg, subsequent encounter: Secondary | ICD-10-CM | POA: Diagnosis not present

## 2019-10-15 MED FILL — Ondansetron HCl Inj 4 MG/2ML (2 MG/ML): INTRAMUSCULAR | Qty: 2 | Status: AC

## 2019-10-16 NOTE — Progress Notes (Signed)
[name], [name] ([id number]) Visit Report for 10/15/2019 HPI Details Patient Name: Date of Service: Michael, [name] 10/15/2019 9:45 AM Medical Record [id number] Patient Account Number: [id number] Date of Birth/Sex: Treating RN: [birth year] (74 y.o. M) Primary Care Provider: [name] Other Clinician: Referring Provider: Treating Provider/Extender:Becka Lagasse, [name], [name] in Treatment: 10 History of Present Illness HPI Description: [name] HPI Description: 74 year old gentleman who was recently seen by his nephrologist [name], and noted to have a wound on his left lower extremity which was lacerated 2 months ago and now has reopened. The patient's left shin has a ulceration with some exudate but no evidence of infection and he was referred to [place] for further care as it was known that the patient has had some peripheral vascular disease in the past. Past medical history significant for chronic kidney disease, atrial fibrillation, diabetes mellitus,status post kidney transplant in 1983 and 2005, a week fistula graft placement, status post previous bowel surgery. 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 10/06/2015 -- x-ray of the left tibia and fibula shows no evidence of osteomyelitis. The patient has also had Doppler studies of his extremity and is awaiting the appointment with the vascular surgeon. We have not yet received these reports. 10/13/2015 -- lower extremity venous duplex reflux evaluation shows reflux in the left common femoral vein, left saphenofemoral junction and the proximal greater saphenous vein extending to the proximal calf. There is also reflux in the left proximal to mid small saphenous vein. Arterial duplex studies done showed the resting ABI was not applicable due to tibial artery medial calcification. The left ABI was 0.8 using the Doppler dorsalis pedis indicating mild arterial  occlusive disease at rest with the posterior tibial artery noted to be noncompressible. The right TBI was 1 which is normal and the left ABI was 1 which is normal. Patient has otherwise been doing fine and has been compliant with his dressings. 10/20/2015 -- He was seen by [name] recently for a vascular opinion on 10/15/2015. His left lower extremity venous insufficiency duplex study revealed GSV reflux,SS vein reflux and deep venous reflux in the common femoral vein. His ABIs were non compressible and his TBI on the right was 1.01 and on the left was 0.80. He was asked to continue with the wound care with compressive therapy followed by EVLA of the left GS vein 3 months. He recommended 20-30 mm thigh-high compression stockings and the need for a three-month trial of this. The patient had an Unna boot applied at the vascular office but he could not tolerate this with a lot of pain and issues with his toes and hence came here on Friday for removal of this and we reapplied a 2 layer compression. 11/10/2015 -- patient still has not purchased his 20-30 mm thigh-high compression stockings as prescribed by [name]. Readmission: 08/08/18 on evaluation today patient presents for readmission concerning a new injury to the left anterior lower extremity. He was previously seen in 2017 here in our clinic. He states that he has done fairly well since that point. Nonetheless he is having at this time some pain but states that he hit this on a table that fell over and actually struck his leg. This appears to have pulled back some of his skin which folded in on itself and is causing some difficulty as far as that is concerned. There does not appear to be any evidence of infection at  this time. No fevers, chills, nausea, or vomiting noted at this time. He's been using dressings on his own currently without complication. 08/15/18 on evaluation today patient actually appears to be doing somewhat better in  regard to his wound of the lower [name] when compared to the first visit last week. I had to do a much more extensive debridement at that time it does appear that I'm gonna have to perform some debridement today but it does not look to be as extensive by any means. Nonetheless fortunately he does not show any signs of infection he does have discomfort at this site. I believe based on what I'm seeing currently he may benefit from Iodoflex to help keep the wound bed clean. Patient tolerated therapy without complication. Upon evaluation today the patient actually appears to be doing excellent in regard to his left lower extremity ulcer. This is much better than the previous two visits where he had a lot of necrotic tissue around the edge of the wound simply due to the fact that again there was a significant skin tear where the edge had been cleared away prior to reattaching and being able to heal appropriately. He seems to be doing much better at this point. 08/28/18 on evaluation today the patient's wound actually does appear to be showing signs of improvement. With that being said though he is improving he would likely note even greater improvement if we were able to sharply debride the wound. Nonetheless this caused him to much discomfort he tells me. 09/04/18 on evaluation today patient actually appears to be showing signs of improvement in regard to his left lower extremity ulcer. He has been tolerating the dressing changes including the wrap although he tells me at this point that the burning does last for a couple of days even with just the Iodoflex. I was afraid that this may been part of the issue that he was having with discomfort. It does seem to be the case. Nonetheless he shows no signs of evidence of infection at this time which is good news. No fevers chills noted ADMISSION to [name] 10/05/2018 This is a patient who was cared for in 2017 and again in the fall of  this year at our sister clinic and [facility]. He actually lives in [place] in [place]. We have been dealing with an apparent traumatic area on the left anterior tibial area. This is been present for the last several months. He was supposed to be using Iodoflex Kerlix and Coban however he was hospitalized from 09/05/2018 through 09/11/2018 with delirium secondary to pneumonia. Since then he is only been putting Vaseline gauze on this without compression. He also has a more recent skin tear on the dorsal right hand that may have only happened in the last week. The patient had arterial studies done in 2017 in January which was 3 years ago. At that point he had noncompressible ABIs but really quite good TBI's both normal. Triphasic waveforms on the right monophasic at the left posterior tibial but triphasic at the left dorsalis pedis. His ABIs in our clinic today were both noncompressible 1/23; the patient has wounds on the right dorsal hand just distal to the wrist and on the left anterior lower extremity. Both of these look very healthy he is using Hydrofera Blue 1/30; left anterior lower extremity wound much smaller. Healthy looking surface. The laceration area just distal to the wrist on the dorsal hand on the right is also just about closed  I used Hydrofera Blue here 2/6; left anterior lower extremity wound is much smaller but still open. The laceration area just distal to the wrist on the dorsal hand is fully epithelialized. 2/13; the patient's anterior lower extremity wound is closed. The laceration just distal to the wrist on the dorsal hand is also fully epithelialized and closed. The patient has external compression stockings which I think are 20/30 READMISSION 08/06/2019 Mr. Noe is a 74 year old man with had several times previously in our clinic. He is a diabetic with a history of chronic renal insufficiency status post kidney transplant in 1983 and again in 2005. He was then in  2017 with a laceration on the left lower extremity. He was worked up at the time with arterial studies and reflux studies. The arterial studies showed ABIs to be noncompressible but TBI's were within normal limits. I do not have the reflux studies at the moment. He was also sent here in 2019 with a left lower extremity wound and then again in 2020 with left lower extremity trauma a skin tear on the wrist. He was discharged with 20/30 stockings identified from myself that that might not be enough compression. Nevertheless he states he was wearing these fairly reliably. In September he had a fall with a substantial bruise in the area of the wound. He says he saw orthopedics and they told him there was some muscle strain sometime it later this opened into a wound. He has a fairly substantial wound on the right posterior calf. Satellite areas around this including medially and posteriorly. He has not worn his stockings since the injury Past medical history; includes chronic renal failure secondary to diabetes with kidney transplant x2, atrial fibrillation, heart failure with preserved ejection fraction, coronary artery disease. ABIs on the right in our clinic were once again noncompressible 08/13/2019 on evaluation today patient appears to be doing decently well with regard to his wound compared to last week's evaluation. Unfortunately he is still having a lot of discomfort at this point which is I think in some part due to the 3 layer compression wrap which is a little bit stronger I think for him. When he was here before we actually utilized a Kerlix and Coban wrap which he states seemed to got a little bit better. Nonetheless I think we can probably drop back to this in light of the discomfort that he had. Nonetheless the pain was not really right around the wound itself as much as it was around the ankle in particular. The Iodoflex does seem to have done well for him As the wound is appearing  somewhat better today which is excellent news. 11/30; still complaining of a lot of pain. Apparently arterial studies I ordered 2 weeks ago are below. I do not believe we have an appointment with vein and vascular as of yet; ABI Findings: +---------+------------------+-----+----------+--------+ Right Rt Pressure (mmHg)IndexWaveform Comment  +---------+------------------+-----+----------+--------+ PTA >254 1.50 monophasic  +---------+------------------+-----+----------+--------+ DP >254 1.50 monophasic  +---------+------------------+-----+----------+--------+ Great Toe68 0.40 Abnormal   +---------+------------------+-----+----------+--------+ +---------+------------------+-----+----------+-------+ Left Lt Pressure (mmHg)IndexWaveform Comment +---------+------------------+-----+----------+-------+ Brachial 169     +---------+------------------+-----+----------+-------+ PTA >254 1.50 monophasic  +---------+------------------+-----+----------+-------+ DP >254 1.50 monophasic  +---------+------------------+-----+----------+-------+ Great Toe40 0.24 Abnormal   +---------+------------------+-----+----------+-------+ Pedal arteries appear hyperemic. Patient refused Brachial pressure in the right arm. Summary: Right: Resting right ankle-brachial index indicates noncompressible right lower extremity arteries. The right toe- brachial index is abnormal. Left: Resting left ankle-brachial index indicates noncompressible left lower extremity arteries. The left toe-brachial index is abnormal. He is  also having considerably more swelling in his left calf. This was not there when I last saw him 2 weeks ago. He tells me that some of the home health compression wraps have been slipping down and that may be the issue here however a month I am uncertain 69/6; sees vascular on December 22. Still complaining of a lot of pain. DVT study I did last time  was negative for DVT 12/14; still complaining of pain which if this is arterial is certainly claudication and rest keeps him uncomfortable at night. He has an appointment with vein and vascular on December 22. Wound surface is better using Iodoflex. Once the surface of this is satisfactory and we have exhausted the vascular route. Perhaps an advanced treatment option. He has a configuration of the venous ulceration although his arterial studies are not very good. The other issue is the patient has a transplanted kidney. This will make angiography difficult and challenging issue 12/21; still complaining of pain and drainage. We are using Iodoflex on the wound under compression. He sees Dr. Donnetta Hutching tomorrow to evaluate his noninvasive studies noted above. He has a transplanted kidney further complicating the options for angiography. 12/28; still using Iodoflex under compression. I have Dr. Luther Parody note from 12/22. he noted arterial studies revealing monophasic waveforms at the pedal vessels bilaterally and calcified vessels making the ABI unreliable. He did not comment on the reduced TBI's. He felt these were venous wounds based on the palpable dorsalis pedis pulse. 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 And they arranged for formal venous duplex with reflux studies in the next several weeks. Follow-up with either Scot Dock or Dr. Oneida Alar 09/24/2019 still using Iodoflex under compression. He has an appointment with Dr. Doren Custard on 1/7 with regards to his venous disease. The patient was not felt to have a primary arterial problem for the nonhealing of his wound. We did attempt to wrap him with 3 layer when he first came into the clinic he complained of a lot of pain in the ankle although this may have been because the dressing fell down somewhat. He has far too edema fluid in the right leg for a good prognosis about healing this wound He comes in today with an excoriation on the bottom part of his  right fifth toe. He thinks he may have done this taking off his clothes and that something got caught on the toe. There is no open wound however the toe itself is very painful 1/11; we are using Iodoflex under compression. The wound bed is clean. He went on to see Dr. Doren Custard on 09/27/2019 he again feels that the patient's arterial supply is adequate. He feels that he might benefit from right greater saphenous vein ablation for a venous ulcer. In the meantime the area that he was complaining about last week on the right fifth toe. An x-ray that I ordered showed marked soft tissue swelling along the distal aspect of the right foot but there was no evidence of osteomyelitis. He comes in today with the fifth toenail just about coming off. He has black eschar underneath the toe on the plantar aspect. The toe was swollen red and very painful. In the right setting this could be a significant soft tissue infection versus ischemia of the toe itself. He did not show this to Dr. Doren Custard 1/18; I am using Iodoflex under compression a large wound on the right posterior calf. He had his right greater saphenous vein ablation by Dr. Doren Custard although I  am not sure that is the only problem here. The right fifth toe which was possibly trauma 2 weeks ago continues to be exceptionally painful with a necrotic tip. Maybe not quite as swollen. I started him empirically on Augmentin 5/125 1 p.o. twice daily last week after discussing this with Dr. Loletha Grayer of nephrology. Perhaps somewhat better this week but not as good as I was hoping. A plain x-ray was negative. He comes in today with an area on the medial right calf that was blistered and now open. In looking at things he appears to be systemically fluid volume overloaded. He has a transplanted kidney. He states he takes his Lasix variably when he has appointments he tries not to take it the later I am really not certain if he takes this reliably. However he has far too much edema  fluid in the right leg to easily heal this wound and he appears to be developing blisters medially to form additional wounds 1/25; his CO2 angiogram was done by Dr. Doren Custard last week. This showed the proximal arteries all to be patent. On the right side the common femoral deep femoral superficial femoral popliteal anterior tibial and peroneal arteries were patent. The posterior tibial was occluded but reconstituted distally via collaterals from the peroneal artery he was felt he should have enough blood flow to heal his wounds including the right fifth toe. The right fifth toe looks better however he is still complaining of a lot of pain. The large area which is a venous ulceration posteriorly has a better looking surface I think we can switch to [employer] today Electronic Signature(s) Signed: 10/16/2019 5:32:11 PM By: [name] MD Entered By: [name] on 10/15/2019 11:10:25 -------------------------------------------------------------------------------- Physical Exam Details Patient Name: Date of Service: JATAVIOUS, PEPPARD 10/15/2019 9:45 AM Medical Record [id number] Patient Account Number: [id number] Date of Birth/Sex: Treating RN: [birth year] (74 y.o. M) Primary Care Provider: [name] Other Clinician: Referring Provider: Treating Provider/Extender:Andrian Urbach, [name], [name] in Treatment: 10 Constitutional Patient is hypertensive.. Pulse regular and within target range for patient.Marland Kitchen Respirations regular, non-labored and within target range.. Temperature is normal and within the target range for the patient.Marland Kitchen Appears in no distress. Notes Wound exam Right fifth toe has a necrotic tip but I am not going to debride this. There is no palpable tenderness The substantial calf wound looks better. There is still going to be some debridement required I used Anasept and gauze today but it some of the circumference mechanical debridement is likely  to be necessary Electronic Signature(s) Signed: 10/16/2019 5:32:11 PM By: [name] MD Entered By: [name] on 10/15/2019 11:11:22 -------------------------------------------------------------------------------- Physician Orders Details Patient Name: Date of Service: KAID, SEEBERGER 10/15/2019 9:45 AM Medical Record [id number] Patient Account Number: [id number] Date of Birth/Sex: Treating RN: 12-24-1945 (73 y.o. [name] Primary Care Provider: [name] Other Clinician: Referring Provider: Treating Provider/Extender:Brixton Franko, [name], [name] in Treatment: 10 Verbal / Phone Orders: No Diagnosis Coding ICD-10 Coding Code Description S80.11XD Contusion of right lower leg, subsequent encounter L97.212 Non-pressure chronic ulcer of right calf with fat layer exposed I87.321 Chronic venous hypertension (idiopathic) with inflammation of right lower extremity E11.622 Type 2 diabetes mellitus with other skin ulcer L97.518 Non-pressure chronic ulcer of other part of right foot with other specified severity Follow-up Appointments Return Appointment in 1 week. Dressing Change Frequency Change dressing three times week. - all wounds - 2x by home health on Wednesdays and Fridays, 1x by wound clinic  on Mondays Skin Barriers/Peri-Wound Care Barrier cream Moisturizing lotion Wound Cleansing Clean wound with Normal Saline. - or normal saline on days that dressing is changed May shower with protection. Primary Wound Dressing Wound #3 Right,Posterior Lower Leg Hydrofera Blue - Classic Wound #5 Right Toe Fifth Foam Wound #6 Right,Medial Lower Leg Calcium Alginate with Silver - any weeping/blistered areas Wound #7 Right,Distal,Posterior Lower Leg Calcium Alginate with Silver - any weeping/blistered areas Secondary Dressing Wound #3 Right,Posterior Lower Leg Foam - foam top bend of foot for protection ABD pad Wound #5 Right Toe  Fifth Kerlix/Rolled Gauze - toe needs to be completely covered, including toenail Dry Gauze Wound #6 Right,Medial Lower Leg ABD pad Wound #7 Right,Distal,Posterior Lower Leg ABD pad Edema Control 3 Layer Compression System - Right Lower Extremity Avoid standing for long periods of time Elevate legs to the level of the heart or above for 30 minutes daily and/or when sitting, a frequency of: - throughout the day Exercise regularly Additional Orders / Instructions Other: - Go to ER if toe/leg gets more red/black, increased pain, fever Other: - Make follow up appt with Kidney doctor regarding uncontrolled swelling [facility] for wound care. - [facility] Electronic Signature(s) Signed: 10/15/2019 6:46:58 PM By: [name] RN, BSN Signed: 10/16/2019 5:32:11 PM By: [name] MD Entered By: [name] on 10/15/2019 11:00:14 -------------------------------------------------------------------------------- Problem List Details Patient Name: Date of Service: COAL, NEARHOOD 10/15/2019 9:45 AM Medical Record [id number] Patient Account Number: [id number] Date of Birth/Sex: Treating RN: December 14, 1945 (73 y.o. Jonette Eva, Briant Cedar Primary Care Provider: [name] Other Clinician: Referring Provider: Treating Provider/Extender:Cohan Stipes, [name], [name] in Treatment: 10 Active Problems ICD-10 Evaluated Encounter Code Description Active Date Today Diagnosis S80.11XD Contusion of right lower leg, subsequent encounter 08/06/2019 No Yes L97.212 Non-pressure chronic ulcer of right calf with fat layer 08/06/2019 No Yes exposed I87.321 Chronic venous hypertension (idiopathic) with 08/06/2019 No Yes inflammation of right lower extremity E11.622 Type 2 diabetes mellitus with other skin ulcer 08/06/2019 No Yes L97.518 Non-pressure chronic ulcer of other part of right foot 10/01/2019 No Yes with other specified  severity Inactive Problems Resolved Problems Electronic Signature(s) Signed: 10/16/2019 5:32:11 PM By: [name] MD Entered By: [name] on 10/15/2019 11:08:46 -------------------------------------------------------------------------------- Progress Note Details Patient Name: Date of Service: AYDEN, HARDWICK 10/15/2019 9:45 AM Medical Record [id number] Patient Account Number: [id number] Date of Birth/Sex: Treating RN: [birth year] (74 y.o. M) Primary Care Provider: [name] Other Clinician: Referring Provider: Treating Provider/Extender:Finnley Lewis, [name], [name] in Treatment: 10 Subjective History of Present Illness (HPI) [place] HPI Description: 73 year old gentleman who was recently seen by his nephrologist [name], and noted to have a wound on his left lower extremity which was lacerated 2 months ago and now has reopened. The patient's left shin has a ulceration with some exudate but no evidence of infection and he was referred to [place] for further care as it was known that the patient has had some peripheral vascular disease in the past. Past medical history significant for chronic kidney disease, atrial fibrillation, diabetes mellitus,status post kidney transplant in 1983 and 2005, a week fistula graft placement, status post previous bowel surgery. 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 10/06/2015 -- x-ray of the left tibia and fibula shows no evidence of osteomyelitis. The patient has also had Doppler studies of his extremity and is awaiting the appointment with the vascular surgeon. We  have not yet received these reports. 10/13/2015 -- lower extremity venous duplex reflux evaluation shows reflux in the left common femoral vein, left saphenofemoral junction and the proximal greater saphenous vein extending to the proximal calf. There is also reflux in the left proximal to mid small  saphenous vein. Arterial duplex studies done showed the resting ABI was not applicable due to tibial artery medial calcification. 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 The right TBI was 1 which is normal and the left ABI was 1 which is normal. Patient has otherwise been doing fine and has been compliant with his dressings. 10/20/2015 -- He was seen by [name] recently for a vascular opinion on 10/15/2015. His left lower extremity venous insufficiency duplex study revealed GSV reflux,SS vein reflux and deep venous reflux in the common femoral vein. His ABIs were non compressible and his TBI on the right was 1.01 and on the left was 0.80. He was asked to continue with the wound care with compressive therapy followed by EVLA of the left GS vein 3 months. He recommended 20-30 mm thigh-high compression stockings and the need for a three-month trial of this. The patient had an Unna boot applied at the vascular office but he could not tolerate this with a lot of pain and issues with his toes and hence came here on Friday for removal of this and we reapplied a 2 layer compression. 11/10/2015 -- patient still has not purchased his 20-30 mm thigh-high compression stockings as prescribed by [name]. Readmission: 08/08/18 on evaluation today patient presents for readmission concerning a new injury to the left anterior lower extremity. He was previously seen in 2017 here in our clinic. He states that he has done fairly well since that point. Nonetheless he is having at this time some pain but states that he hit this on a table that fell over and actually struck his leg. This appears to have pulled back some of his skin which folded in on itself and is causing some difficulty as far as that is concerned. There does not appear to be any evidence of infection at this time. No  fevers, chills, nausea, or vomiting noted at this time. He's been using dressings on his own currently without complication. 08/15/18 on evaluation today patient actually appears to be doing somewhat better in regard to his wound of the lower [name] when compared to the first visit last week. I had to do a much more extensive debridement at that time it does appear that I'm gonna have to perform some debridement today but it does not look to be as extensive by any means. Nonetheless fortunately he does not show any signs of infection he does have discomfort at this site. I believe based on what I'm seeing currently he may benefit from Iodoflex to help keep the wound bed clean. Patient tolerated therapy without complication. Upon evaluation today the patient actually appears to be doing excellent in regard to his left lower extremity ulcer. This is much better than the previous two visits where he had a lot of necrotic tissue around the edge of the wound simply due to the fact that again there was a significant skin tear where the edge had been cleared away prior to reattaching and being able to heal appropriately. He seems to be doing much better at this point. 08/28/18 on evaluation today the patient's  wound actually does appear to be showing signs of improvement. With that being said though he is improving he would likely note even greater improvement if we were able to sharply debride the wound. Nonetheless this caused him to much discomfort he tells me. 09/04/18 on evaluation today patient actually appears to be showing signs of improvement in regard to his left lower extremity ulcer. He has been tolerating the dressing changes including the wrap although he tells me at this point that the burning does last for a couple of days even with just the Iodoflex. I was afraid that this may been part of the issue that he was having with discomfort. It does seem to be the case. Nonetheless he shows no  signs of evidence of infection at this time which is good news. No fevers chills noted ADMISSION to [name] 10/05/2018 This is a patient who was cared for in 2017 and again in the fall of this year at our sister clinic and [facility]. He actually lives in [place] in [place]. We have been dealing with an apparent traumatic area on the left anterior tibial area. This is been present for the last several months. He was supposed to be using Iodoflex Kerlix and Coban however he was hospitalized from 09/05/2018 through 09/11/2018 with delirium secondary to pneumonia. Since then he is only been putting Vaseline gauze on this without compression. He also has a more recent skin tear on the dorsal right hand that may have only happened in the last week. The patient had arterial studies done in 2017 in January which was 3 years ago. At that point he had noncompressible ABIs but really quite good TBI's both normal. Triphasic waveforms on the right monophasic at the left posterior tibial but triphasic at the left dorsalis pedis. His ABIs in our clinic today were both noncompressible 1/23; the patient has wounds on the right dorsal hand just distal to the wrist and on the left anterior lower extremity. Both of these look very healthy he is using Hydrofera Blue 1/30; left anterior lower extremity wound much smaller. Healthy looking surface. The laceration area just distal to the wrist on the dorsal hand on the right is also just about closed I used Hydrofera Blue here 2/6; left anterior lower extremity wound is much smaller but still open. The laceration area just distal to the wrist on the dorsal hand is fully epithelialized. 2/13; the patient's anterior lower extremity wound is closed. The laceration just distal to the wrist on the dorsal hand is also fully epithelialized and closed. The patient has external compression stockings which I think are 20/30 READMISSION 08/06/2019 Mr.  Macek is a 74 year old man with had several times previously in our clinic. He is a diabetic with a history of chronic renal insufficiency status post kidney transplant in 1983 and again in 2005. He was then in 2017 with a laceration on the left lower extremity. He was worked up at the time with arterial studies and reflux studies. The arterial studies showed ABIs to be noncompressible but TBI's were within normal limits. I do not have the reflux studies at the moment. He was also sent here in 2019 with a left lower extremity wound and then again in 2020 with left lower extremity trauma a skin tear on the wrist. He was discharged with 20/30 stockings identified from myself that that might not be enough compression. Nevertheless he states he was wearing these fairly reliably. In September he had a fall  with a substantial bruise in the area of the wound. He says he saw orthopedics and they told him there was some muscle strain sometime it later this opened into a wound. He has a fairly substantial wound on the right posterior calf. Satellite areas around this including medially and posteriorly. He has not worn his stockings since the injury Past medical history; includes chronic renal failure secondary to diabetes with kidney transplant x2, atrial fibrillation, heart failure with preserved ejection fraction, coronary artery disease. ABIs on the right in our clinic were once again noncompressible 08/13/2019 on evaluation today patient appears to be doing decently well with regard to his wound compared to last week's evaluation. Unfortunately he is still having a lot of discomfort at this point which is I think in some part due to the 3 layer compression wrap which is a little bit stronger I think for him. When he was here before we actually utilized a Kerlix and Coban wrap which he states seemed to got a little bit better. Nonetheless I think we can probably drop back to this in light of the  discomfort that he had. Nonetheless the pain was not really right around the wound itself as much as it was around the ankle in particular. The Iodoflex does seem to have done well for him As the wound is appearing somewhat better today which is excellent news. 11/30; still complaining of a lot of pain. Apparently arterial studies I ordered 2 weeks ago are below. I do not believe we have an appointment with vein and vascular as of yet; ABI Findings: +---------+------------------+-----+----------+--------+ Right Rt Pressure (mmHg)IndexWaveform Comment  +---------+------------------+-----+----------+--------+ PTA >254 1.50 monophasic  +---------+------------------+-----+----------+--------+ DP >254 1.50 monophasic  +---------+------------------+-----+----------+--------+ Great Toe68 0.40 Abnormal   +---------+------------------+-----+----------+--------+ +---------+------------------+-----+----------+-------+ Left Lt Pressure (mmHg)IndexWaveform Comment +---------+------------------+-----+----------+-------+ Brachial 169     +---------+------------------+-----+----------+-------+ PTA >254 1.50 monophasic  +---------+------------------+-----+----------+-------+ DP >254 1.50 monophasic  +---------+------------------+-----+----------+-------+ Great Toe40 0.24 Abnormal   +---------+------------------+-----+----------+-------+ Pedal arteries appear hyperemic. Patient refused Brachial pressure in the right arm. Summary: Right: Resting right ankle-brachial index indicates noncompressible right lower extremity arteries. The right toe- brachial index is abnormal. Left: Resting left ankle-brachial index indicates noncompressible left lower extremity arteries. The left toe-brachial index is abnormal. He is also having considerably more swelling in his left calf. This was not there when I last saw him 2 weeks ago. He tells me that some of the  home health compression wraps have been slipping down and that may be the issue here however a month I am uncertain 75/1; sees vascular on December 22. Still complaining of a lot of pain. DVT study I did last time was negative for DVT 12/14; still complaining of pain which if this is arterial is certainly claudication and rest keeps him uncomfortable at night. He has an appointment with vein and vascular on December 22. Wound surface is better using Iodoflex. Once the surface of this is satisfactory and we have exhausted the vascular route. Perhaps an advanced treatment option. He has a configuration of the venous ulceration although his arterial studies are not very good. The other issue is the patient has a transplanted kidney. This will make angiography difficult and challenging issue 12/21; still complaining of pain and drainage. We are using Iodoflex on the wound under compression. He sees Dr. Donnetta Hutching tomorrow to evaluate his noninvasive studies noted above. He has a transplanted kidney further complicating the options for angiography. 12/28; still using Iodoflex under compression. I have Dr. Luther Parody note from 12/22. he noted  arterial studies revealing monophasic waveforms at the pedal vessels bilaterally and calcified vessels making the ABI unreliable. He did not comment on the reduced TBI's. He felt these were venous wounds based on the palpable dorsalis pedis pulse. 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 And they arranged for formal venous duplex with reflux studies in the next several weeks. Follow-up with either Scot Dock or Dr. Oneida Alar 09/24/2019 still using Iodoflex under compression. He has an appointment with Dr. Doren Custard on 1/7 with regards to his venous disease. The patient was not felt to have a primary arterial problem for the nonhealing of his wound. We did attempt to wrap him with 3 layer when he first came into the clinic he complained of a lot of pain in the ankle although  this may have been because the dressing fell down somewhat. He has far too edema fluid in the right leg for a good prognosis about healing this wound He comes in today with an excoriation on the bottom part of his right fifth toe. He thinks he may have done this taking off his clothes and that something got caught on the toe. There is no open wound however the toe itself is very painful 1/11; we are using Iodoflex under compression. The wound bed is clean. He went on to see Dr. Doren Custard on 09/27/2019 he again feels that the patient's arterial supply is adequate. He feels that he might benefit from right greater saphenous vein ablation for a venous ulcer. In the meantime the area that he was complaining about last week on the right fifth toe. An x-ray that I ordered showed marked soft tissue swelling along the distal aspect of the right foot but there was no evidence of osteomyelitis. He comes in today with the fifth toenail just about coming off. He has black eschar underneath the toe on the plantar aspect. The toe was swollen red and very painful. In the right setting this could be a significant soft tissue infection versus ischemia of the toe itself. He did not show this to Dr. Doren Custard 1/18; I am using Iodoflex under compression a large wound on the right posterior calf. He had his right greater saphenous vein ablation by Dr. Doren Custard although I am not sure that is the only problem here. ooThe right fifth toe which was possibly trauma 2 weeks ago continues to be exceptionally painful with a necrotic tip. Maybe not quite as swollen. I started him empirically on Augmentin 5/125 1 p.o. twice daily last week after discussing this with Dr. Loletha Grayer of nephrology. Perhaps somewhat better this week but not as good as I was hoping. A plain x-ray was negative. He comes in today with an area on the medial right calf that was blistered and now open. In looking at things he appears to be systemically fluid volume  overloaded. He has a transplanted kidney. He states he takes his Lasix variably when he has appointments he tries not to take it the later I am really not certain if he takes this reliably. However he has far too much edema fluid in the right leg to easily heal this wound and he appears to be developing blisters medially to form additional wounds 1/25; his CO2 angiogram was done by Dr. Doren Custard last week. This showed the proximal arteries all to be patent. On the right side the common femoral deep femoral superficial femoral popliteal anterior tibial and peroneal arteries were patent. The posterior tibial was occluded but reconstituted distally via collaterals from  the peroneal artery he was felt he should have enough blood flow to heal his wounds including the right fifth toe. The right fifth toe looks better however he is still complaining of a lot of pain. The large area which is a venous ulceration posteriorly has a better looking surface I think we can switch to Hydrofera Blue today Objective Constitutional Patient is hypertensive.. Pulse regular and within target range for patient.Marland Kitchen Respirations regular, non-labored and within target range.. Temperature is normal and within the target range for the patient.Marland Kitchen Appears in no distress. Vitals Time Taken: 10:06 AM, Height: 67 in, Weight: 190 lbs, BMI: 29.8, Temperature: 98.2 F, Pulse: 66 bpm, Respiratory Rate: 18 breaths/min, Blood Pressure: 153/77 mmHg, Capillary Blood Glucose: 119 mg/dl. General Notes: CBG per patient General Notes: Wound exam ooRight fifth toe has a necrotic tip but I am not going to debride this. There is no palpable tenderness ooThe substantial calf wound looks better. There is still going to be some debridement required I used Anasept and gauze today but it some of the circumference mechanical debridement is likely to be necessary Integumentary (Hair, Skin) Wound #3 status is Open. Original cause of wound was Trauma. The  wound is located on the Right,Posterior Lower Leg. The wound measures 20cm length x 9cm width x 0.5cm depth; 141.372cm^2 area and 70.686cm^3 volume. There is Fat Layer (Subcutaneous Tissue) Exposed exposed. There is no tunneling or undermining noted. There is a large amount of purulent drainage noted. The wound margin is well defined and not attached to the wound base. There is medium (34-66%) pink granulation within the wound bed. There is a medium (34-66%) amount of necrotic tissue within the wound bed including Adherent Slough. Wound #5 status is Open. Original cause of wound was Blister. The wound is located on the Right Toe Fifth. The wound measures 1cm length x 1cm width x 0.1cm depth; 0.785cm^2 area and 0.079cm^3 volume. There is no tunneling or undermining noted. There is a small amount of serous drainage noted. The wound margin is distinct with the outline attached to the wound base. There is no granulation within the wound bed. There is a large (67- 100%) amount of necrotic tissue within the wound bed including Eschar. Wound #6 status is Open. Original cause of wound was Blister. The wound is located on the Right,Medial Lower Leg. The wound measures 0cm length x 0cm width x 0cm depth; 0cm^2 area and 0cm^3 volume. There is no tunneling or undermining noted. There is a none present amount of drainage noted. The wound margin is distinct with the outline attached to the wound base. There is no granulation within the wound bed. There is no necrotic tissue within the wound bed. Wound #7 status is Open. Original cause of wound was Blister. The wound is located on the Right,Distal,Posterior Lower Leg. The wound measures 0cm length x 0cm width x 0cm depth; 0cm^2 area and 0cm^3 volume. There is no tunneling or undermining noted. There is a none present amount of drainage noted. The wound margin is distinct with the outline attached to the wound base. There is no granulation within the wound bed.  There is no necrotic tissue within the wound bed. 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 There was a Three Layer Compression Therapy Procedure by [name], RN. Post procedure Diagnosis Wound #3: Same as Pre-Procedure Wound #6 Pre-procedure diagnosis of Wound #6 is a Venous Leg Ulcer located on the Right,Medial Lower Leg . There was a Three Layer Compression Therapy Procedure by [name], RN. Post procedure Diagnosis Wound #6: Same as Pre-Procedure Wound #7 Pre-procedure diagnosis of Wound #7 is a Venous Leg Ulcer located on the Right,Distal,Posterior Lower Leg . There was a Three Layer Compression Therapy Procedure by [name], RN. Post procedure Diagnosis Wound #7: Same as Pre-Procedure Plan Follow-up Appointments: Return Appointment in 1 week. Dressing Change Frequency: Change dressing three times week. - all wounds - 2x by home health on Wednesdays and Fridays, 1x by wound clinic on Mondays Skin Barriers/Peri-Wound Care: Barrier cream Moisturizing lotion Wound Cleansing: Clean wound with Normal Saline. - or normal saline on days that dressing is changed May shower with protection. Primary Wound Dressing: Wound #3 Right,Posterior Lower Leg: Hydrofera Blue - Classic Wound #5 Right Toe Fifth: Foam Wound #6 Right,Medial Lower Leg: Calcium Alginate with Silver - any weeping/blistered areas Wound #7 Right,Distal,Posterior Lower Leg: Calcium Alginate with Silver - any weeping/blistered areas Secondary Dressing: Wound #3  Right,Posterior Lower Leg: Foam - foam top bend of foot for protection ABD pad Wound #5 Right Toe Fifth: Kerlix/Rolled Gauze - toe needs to be completely covered, including toenail Dry Gauze Wound #6 Right,Medial Lower Leg: ABD pad Wound #7 Right,Distal,Posterior Lower Leg: ABD pad Edema Control: 3 Layer Compression System - Right Lower Extremity Avoid standing for long periods of time Elevate legs to the level of the heart or above for 30 minutes daily and/or when sitting, a frequency of: - throughout the day Exercise regularly Additional Orders / Instructions: Other: - Go to ER if toe/leg gets more red/black, increased pain, fever Other: - Make follow up appt with Kidney doctor regarding uncontrolled swelling Home Health: [facility] for wound care. - [facility] 1. Change the primary dressing on the calf wound to Hydrofera Blue 2. Gauze to the fifth toe and monitoring. 3. With regards to his PAD Dr. Doren Custard felt he had enough blood flow to heal these wounds. Indeed his peripheral pulses were palpable today dorsalis pedis and posterior tibial. Electronic Signature(s) Signed: 10/16/2019 5:32:11 PM By: [name] MD Entered By: [name] on 10/15/2019 11:12:26 -------------------------------------------------------------------------------- SuperBill Details Patient Name: Date of Service: BARLOW, HARRISON 10/15/2019 Medical Record [id number] Patient Account Number: [id number] Date of Birth/Sex: Treating RN: [birth year] (73 y.o. [name] Primary Care Provider: [name] Other Clinician: Referring Provider: Treating Provider/Extender:Arryn Terrones, [name], [name] in Treatment: 10 Diagnosis Coding ICD-10 Codes Code Description S80.11XD Contusion of right lower leg, subsequent encounter L97.212 Non-pressure chronic ulcer of right calf with fat layer exposed I87.321 Chronic venous hypertension (idiopathic)  with inflammation of right lower extremity E11.622 Type 2 diabetes mellitus with other skin ulcer L97.518 Non-pressure chronic ulcer of other part of right foot with other specified severity Facility Procedures CPT4 Code Description: [id number] (Facility Use Only) [id number] - APPLY MULTLAY COMPRS LWR RT LEG Modifier: Quantity: 1 Physician Procedures CPT4 Code Description: [id number] - WC PHYS LEVEL 3 - EST PT ICD-10 Diagnosis Description L97.212 Non-pressure chronic ulcer of right calf with fat layer ex I87.321 Chronic venous hypertension (idiopathic) with inflammation extremity L97.518  Non-pressure chronic ulcer of other part of  right foot wit Modifier: posed of right lower h other specifie Quantity: 1 d severity Electronic Signature(s) Signed: 10/16/2019 5:32:11 PM By: [name] MD Entered By: [name] on 10/15/2019 11:12:49

## 2019-10-18 NOTE — Progress Notes (Signed)
Michael, [name] ([id number]) Visit Report for 10/15/2019 Arrival Information Details Patient Name: Date of Service: [name], [name] 10/15/2019 9:45 AM Medical Record [id number] Patient Account Number: [id number] Date of Birth/Sex: Treating RN: [birth year] (74 y.o. [name]) Carlene Coria Primary Care Ian Castagna: [name] Other Clinician: Referring [name]: Treating Latravious Levitt/Extender:Robson, [name], [name] in Treatment: 10 Visit Information History Since Last Visit All ordered tests and consults were completed: No Patient Arrived: Ambulatory Added or deleted any medications: No Arrival Time: 10:02 Any new allergies or adverse reactions: No Accompanied By: self Had a fall or experienced change in No Transfer Assistance: None activities of daily living that may affect Patient Identification Verified: Yes risk of falls: Secondary Verification Process Yes Signs or symptoms of abuse/neglect since last No Completed: visito Patient Has Alerts: Yes Hospitalized since last visit: No Patient Alerts: non Implantable device outside of the clinic excluding No compressable cellular tissue based products placed in the center since last visit: Has Dressing in Place as Prescribed: Yes Has Compression in Place as Prescribed: Yes Pain Present Now: No Electronic Signature(s) Signed: 10/15/2019 6:01:30 PM By: Carlene Coria RN Entered By: Carlene Coria on 10/15/2019 10:06:33 -------------------------------------------------------------------------------- Compression Therapy Details Patient Name: Date of Service: PRENTIS, LANGDON 10/15/2019 9:45 AM Medical Record [id number] Patient Account Number: [id number] Date of Birth/Sex: Treating RN: 1945/11/12 (74 y.o. [name] Primary Care Sonnia Strong: [name] Other Clinician: Referring Jaynell Castagnola: Treating Nalla Purdy/Extender:Robson, [name], [name] in Treatment: 10 Compression  Therapy Performed for Wound Wound #3 Right,Posterior Lower Leg Assessment: Performed By: Clinician [name], RN Compression Type: Three Layer Post Procedure Diagnosis Same as Pre-procedure Electronic Signature(s) Signed: 10/15/2019 6:46:58 PM By: [name] RN, BSN Entered By: [name] on 10/15/2019 11:01:07 -------------------------------------------------------------------------------- Compression Therapy Details Patient Name: Date of Service: MAKENA, MCGRADY 10/15/2019 9:45 AM Medical Record [id number] Patient Account Number: [id number] Date of Birth/Sex: Treating RN: 12-Oct-1945 (74 y.o. [name] Primary Care Deklan Minar: [name] Other Clinician: Referring Allina Riches: Treating Jaedyn Marrufo/Extender:Robson, [name], [name] in Treatment: 10 Compression Therapy Performed for Wound Wound #6 Right,Medial Lower Leg Assessment: Performed By: Clinician [name], RN Compression Type: Three Layer Post Procedure Diagnosis Same as Pre-procedure Electronic Signature(s) Signed: 10/15/2019 6:46:58 PM By: [name] RN, BSN Entered By: [name] on 10/15/2019 11:01:08 -------------------------------------------------------------------------------- Compression Therapy Details Patient Name: Date of Service: JERAD, DUNLAP 10/15/2019 9:45 AM Medical Record [id number] Patient Account Number: [id number] Date of Birth/Sex: Treating RN: 1945/11/01 (74 y.o. [name] Primary Care [name]: [name] Other Clinician: Referring Seraphim Trow: Treating [name]/Extender:Robson, [name], [name] in Treatment: 10 Compression Therapy Performed for Wound Wound #7 Right,Distal,Posterior Lower Leg Assessment: Performed By: Clinician [name], RN Compression Type: Three Layer Post Procedure Diagnosis Same as Pre-procedure Electronic Signature(s) Signed: 10/15/2019 6:46:58 PM By: [name] RN, BSN Entered By: [name] on 10/15/2019 11:01:08 -------------------------------------------------------------------------------- Encounter Discharge Information Details Patient Name: Date of Service: KLAYTON, MONIE 10/15/2019 9:45 AM Medical Record [id number] Patient Account Number: [id number] Date of Birth/Sex: Treating RN: 08-12-1946 (74 y.o. [name] Primary Care Totiana Everson: [name] Other Clinician: Referring Bradyn Vassey: Treating Nohealani Medinger/Extender:Robson, [name], [name] in Treatment: 10 Encounter Discharge Information Items Discharge Condition: Stable Ambulatory Status: Ambulatory Discharge Destination: Home Transportation: Private Auto Accompanied By: self Schedule Follow-up Appointment: Yes Clinical Summary of Care: Patient Declined Electronic Signature(s) Signed: 10/18/2019 7:44:20 AM By: Kela Millin Entered By: Kela Millin on 10/15/2019 11:24:15 -------------------------------------------------------------------------------- Lower Extremity Assessment Details Patient Name: Date of  Service: Michael, [name] 10/15/2019 9:45 AM Medical Record [id number] Patient Account Number: [id number] Date of Birth/Sex: Treating RN: 10-Jul-1946 (74 y.o. [name]) Carlene Coria Primary Care Benjamine Strout: [name] Other Clinician: Referring [name]: Treating [name]/Extender:Robson, [name], [name] in Treatment: 10 Edema Assessment Assessed: [Left: No] [Right: No] Edema: [Left: Ye] [Right: s] Calf Left: Right: Point of Measurement: 30 cm From Medial Instep cm 43 cm Ankle Left: Right: Point of Measurement: 10 cm From Medial Instep cm 23 cm Electronic Signature(s) Signed: 10/15/2019 6:01:30 PM By: Carlene Coria RN Entered By: Carlene Coria on 10/15/2019 10:25:26 -------------------------------------------------------------------------------- Multi Wound Chart Details Patient  Name: Date of Service: Michael, [name] 10/15/2019 9:45 AM Medical Record [id number] Patient Account Number: [id number] Date of Birth/Sex: Treating RN: [birth year] (74 y.o. 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] Comorbid History: [3:Cataracts, Anemia, Arrhythmia, Congestive Heart Failure, Hypertension, Heart Failure, Hypertension, Heart Failure, Hypertension, Type II Diabetes, Gout, Neuropathy] [5:Cataracts, Anemia, Arrhythmia, Congestive Type II Diabetes, Gout,  Neuropathy] [6:Cataracts, Anemia, Arrhythmia, Congestive Type II Diabetes, Gout, Neuropathy] Date Acquired: [3:06/06/2019] [5:09/19/2019] [6:10/08/2019] Weeks of Treatment: [3:10] [5:3] [6:1] Wound Status: [3:Open] [5:Open] [6:Open] Measurements L x W x D 20x9x0.5 [5:1x1x0.1] [6:0x0x0] (cm) Area (cm) : [3:141.372] [5:0.785] [6:0] Volume (cm) : [3:70.686] [5:0.079] [6:0] % Reduction in Area: [3:-527.20%] [5:-33.30%] [6:100.00%] % Reduction in Volume: -945.30% [5:-33.90%] [6:100.00%] Classification: [3:Partial Thickness] [5:Grade 2] [6:Full Thickness Without Exposed Support Structures] Exudate Amount: [3:Large] [5:Small] [6:None Present] Exudate Type: [3:Purulent] [5:Serous] [6:N/A] Exudate Color: [3:yellow, brown, green] [5:amber] [6:N/A] Wound Margin: [3:Well defined, not attached  Distinct, outline attached Distinct, outline attached] Granulation Amount: [3:Medium (34-66%)] [5:None Present (0%)] [6:None Present (0%)] Granulation Quality: [3:Pink] [5:N/A] [6:N/A] Necrotic Amount: [3:Medium (34-66%)] [5:Large (67-100%)] [6:None Present (0%)] Necrotic Tissue: [3:Adherent Slough] [5:Eschar] [6:N/A] Exposed Structures: [3:Fat Layer (Subcutaneous Tissue) Exposed: Yes Fascia: No Tendon: No Muscle: No Joint: No Bone: No] [5:Fascia: No Fat Layer (Subcutaneous Tissue) Exposed: No Tendon: No Muscle: No Joint: No Bone: No] [6:Fascia: No Fat Layer (Subcutaneous Tissue)  Exposed: No Tendon: No Muscle: No Joint: No Bone: No] Epithelialization: [3:None] [5:None] [6:None] Procedures Performed: [3:Compression Therapy] [5:N/A 7 N/A] [6:Compression Therapy N/A] Photos: [3:No Photos] [5:N/A] [6:N/A] Wound Location: [3:Right Lower Leg - Posterior, N/A Distal] [6:N/A] Wounding Event: [3:Blister] [5:N/A] [6:N/A] 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] [5:N/A] [6:N/A] Comorbid History: [3:Cataracts, Anemia, Arrhythmia, Congestive Heart Failure, Hypertension, Type II Diabetes, Gout, Neuropathy] [5:N/A] [6:N/A] Date Acquired: [3:10/08/2019] [5:N/A] [6:N/A] Weeks of Treatment: [3:1] [5:N/A] [6:N/A] Wound Status: [3:Open] [5:N/A] [6:N/A] Measurements L x W x D 0x0x0 [5:N/A] [6:N/A] (cm) Area (cm) : [3:0] [5:N/A] [6:N/A] Volume (cm) : [3:0] [5:N/A] [6:N/A] % Reduction in Area: [3:100.00%] [5:N/A] [6:N/A] % Reduction in Volume: 100.00% [5:N/A] [6:N/A] Classification: [3:Full Thickness Without Exposed Support Structures] [5:N/A] [6:N/A] Exudate Amount: [3:None Present] [5:N/A] [6:N/A] Exudate Type: [3:N/A] [5:N/A] [6:N/A] Exudate Color: [3:N/A] [5:N/A] [6:N/A] Wound Margin: [3:Distinct, outline attached N/A] [6:N/A] Granulation Amount: [3:None Present (0%)] [5:N/A] [6:N/A] Granulation Quality: [3:N/A] [5:N/A] [6:N/A] Necrotic Amount: [3:None Present (0%)] [5:N/A] [6:N/A] Necrotic Tissue:  [3:N/A] [5:N/A] [6:N/A] Exposed Structures: [  3:Fascia: No Fat Layer (Subcutaneous Tissue) Exposed: No Tendon: No Muscle: No Joint: No Bone: No] [5:N/A] [6:N/A] Epithelialization: [3:Large (67-100%)] [5:N/A N/A] [6:N/A N/A] Treatment Notes Electronic Signature(s) Signed: 10/16/2019 5:32:11 PM By: [name] MD Entered By: [name] on 10/15/2019 11:08:56 -------------------------------------------------------------------------------- Multi-Disciplinary Care Plan Details Patient Name: Date of Service: TOMY, KHIM 10/15/2019 9:45 AM Medical Record [id number] Patient Account Number: [id number] Date of Birth/Sex: Treating RN: 08-Jun-1946 (73 y.o. [name] Primary Care Elia Keenum: [name] Other Clinician: Referring Igor Bishop: Treating Vestal Crandall/Extender:Robson, [name], [name] in Treatment: 10 Active Inactive Nutrition Nursing Diagnoses: Impaired glucose control: actual or potential Potential for alteratiion in Nutrition/Potential for imbalanced nutrition Goals: Patient/caregiver agrees to and verbalizes understanding of need to use nutritional supplements and/or vitamins as prescribed Date Initiated: 08/06/2019 Date Inactivated: 09/03/2019 Target Resolution Date: 09/07/2019 Goal Status: Met Patient/caregiver will maintain therapeutic glucose control Date Initiated: 08/06/2019 Target Resolution Date: 11/02/2019 Goal Status: Active Interventions: Assess HgA1c results as ordered upon admission and as needed Assess patient nutrition upon admission and as needed per policy Provide education on elevated blood sugars and impact on wound healing Provide education on nutrition Treatment Activities: Education provided on Nutrition : 08/06/2019 Notes: Venous Leg Ulcer Nursing Diagnoses: Knowledge deficit related to disease process and management Goals: Patient will maintain optimal edema control Date Initiated: 08/06/2019 Target  Resolution Date: 11/02/2019 Goal Status: Active Patient/caregiver will verbalize understanding of disease process and disease management Date Initiated: 08/06/2019 Date Inactivated: 09/03/2019 Target Resolution Date: 09/07/2019 Goal Status: Met Interventions: Assess peripheral edema status every visit. Compression as ordered Provide education on venous insufficiency Notes: Wound/Skin Impairment Nursing Diagnoses: Impaired tissue integrity Goals: Patient/caregiver will verbalize understanding of skin care regimen Date Initiated: 08/06/2019 Target Resolution Date: 11/02/2019 Goal Status: Active Ulcer/skin breakdown will have a volume reduction of 30% by week 4 Target Resolution Date Initiated: 08/06/2019 Date Inactivated: 09/03/2019 Date: 09/07/2019 Unmet Reason: PAD, Goal Status: Unmet necrotic surface Interventions: Assess patient/caregiver ability to obtain necessary supplies Assess patient/caregiver ability to perform ulcer/skin care regimen upon admission and as needed Assess ulceration(s) every visit Provide education on ulcer and skin care Notes: Electronic Signature(s) Signed: 10/15/2019 6:46:58 PM By: [name] RN, BSN Entered By: [name] on 10/15/2019 18:17:57 -------------------------------------------------------------------------------- Pain Assessment Details Patient Name: Date of Service: PATSY, VARMA 10/15/2019 9:45 AM Medical Record [id number] Patient Account Number: [id number] Date of Birth/Sex: Treating RN: 08/24/1946 (73 y.o. Oval Linsey Primary Care Amiyah Shryock: [name] Other Clinician: Referring Belvie Iribe: Treating Ricco Dershem/Extender:Robson, [name], [name] in Treatment: 10 Active Problems Location of Pain Severity and Description of Pain Patient Has Paino Yes Site Locations With Dressing Change: Yes With Dressing Change: Yes Duration of the Pain. Constant / Intermittento Constant Rate the  pain. Current Pain Level: 5 Worst Pain Level: 7 Least Pain Level: 3 Tolerable Pain Level: 5 Character of Pain Describe the Pain: Throbbing Pain Management and Medication Current Pain Management: Medication: Yes Cold Application: No Rest: Yes Massage: No Activity: No T.E.N.S.: No Heat Application: No Leg drop or elevation: No Is the Current Pain Management Adequate: Inadequate How does your wound impact your activities of daily livingo Sleep: Yes Bathing: No Appetite: No Relationship With Others: No Bladder Continence: No Emotions: No Bowel Continence: No Work: No Toileting: No Drive: No Dressing: No Hobbies: No Electronic Signature(s) Signed: 10/15/2019 6:01:30 PM By: Carlene Coria RN Entered By: Carlene Coria on 10/15/2019 10:07:54 -------------------------------------------------------------------------------- Patient/Caregiver Education Details Patient Name: Date of Service: [name] 1/25/2021andnbsp9:45 AM Medical Record  [id number] Patient Account Number: [id number] Date of Birth/Gender: Treating RN: 06/05/1946 (74 y.o. [name] Primary Care Physician: [name] Other Clinician: Referring Physician: Treating Physician/Extender:Robson, [name], [name] in Treatment: 10 Education Assessment Education Provided To: Patient Education Topics Provided Venous: Methods: Explain/Verbal Responses: State content correctly Wound/Skin Impairment: Methods: Explain/Verbal Responses: State content correctly Electronic Signature(s) Signed: 10/15/2019 6:46:58 PM By: [name] RN, BSN Entered By: [name] on 10/15/2019 18:18:17 -------------------------------------------------------------------------------- Wound Assessment Details Patient Name: Date of Service: PAXTYN, BOYAR 10/15/2019 9:45 AM Medical Record [id number] Patient Account Number: [id number] Date of Birth/Sex: Treating RN: 1946-04-07 (73  y.o. M) Primary Care Kano Heckmann: [name] Other Clinician: Referring Sehaj Mcenroe: Treating Shajuan Musso/Extender:Robson, [name], [name] in Treatment: 10 Wound Status Wound Number: 3 Primary Venous Leg Ulcer Etiology: Wound Location: Right Lower Leg - Posterior Wound Open Wounding Event: Trauma Status: Date Acquired: 06/06/2019 Comorbid Cataracts, Anemia, Arrhythmia, Congestive Weeks Of Treatment: 10 History: Heart Failure, Hypertension, Type II Diabetes, Clustered Wound: No Gout, Neuropathy Photos Wound Measurements Length: (cm) 20 Width: (cm) 9 Depth: (cm) 0.5 Area: (cm) 141.372 Volume: (cm) 70.686 Wound Description Classification: Partial Thickness Wound Margin: Well defined, not attached Exudate Amount: Large Exudate Type: Purulent Exudate Color: yellow, brown, green Wound Bed Granulation Amount: Medium (34-66%) Granulation Quality: Pink Necrotic Amount: Medium (34-66%) Necrotic Quality: Adherent Slough After Cleansing: No brino Yes Exposed Structure scia Exposed: t Layer (Subcutaneous Tissue) Exposed: ndon Exposed: scle Exposed: int Exposed: ne Exposed: % Reduction in Area: -527.2% % Reduction in Volume: -945.3% Epithelialization: None Tunneling: No Undermining: No Foul Odor Slough/Fi Fa [facility] No Yes No No No No Treatment Notes Wound #3 (Right, Posterior Lower Leg) 1. Cleanse With Wound Cleanser Soap and water 2. Periwound Care Barrier cream Moisturizing lotion 3. Primary Dressing Applied Calcium Alginate Ag Hydrofera Blue 4. Secondary Dressing ABD Pad Kerramax/Xtrasorb 6. Support Layer Applied 3 layer compression wrap Notes silver alginate to weeping site, distal lower leg and medial lower leg. hydrofera blue classic to posterior lower lel Electronic Signature(s) Signed: 10/15/2019 4:40:42 PM By: Mikeal Hawthorne EMT/HBOT Entered By: Mikeal Hawthorne on 10/15/2019  14:32:36 -------------------------------------------------------------------------------- Wound Assessment Details Patient Name: Date of Service: [name], DUGAL 10/15/2019 9:45 AM Medical Record [id number] Patient Account Number: [id number] Date of Birth/Sex: Treating RN: [birth year] (74 y.o. M) Primary Care Marland Reine: [name] Other Clinician: Referring Ryland Smoots: Treating Zeriyah Wain/Extender:Robson, [name], [name] in Treatment: 10 Wound Status Wound Number: 5 Primary Diabetic Wound/Ulcer of the Lower Extremity Etiology: Wound Location: Right Toe Fifth Wound Open Wounding Event: Blister Status: Date Acquired: 09/19/2019 ComorbidCataracts, Anemia, Arrhythmia, Congestive Weeks Of Treatment: 3 Weeks Of Treatment: 3 History: Heart Failure, Hypertension, Type II Diabetes, Clustered Wound: No Gout, Neuropathy Photos Wound Measurements Length: (cm) 1 Width: (cm) 1 Depth: (cm) 0.1 Area: (cm) 0.785 Volume: (cm) 0.079 Wound Description Classification: Grade 2 Wound Margin: Distinct, outline attached Exudate Amount: Small Exudate Type: Serous Exudate Color: amber Wound Bed Granulation Amount: None Present (0%) Necrotic Amount: Large (67-100%) Necrotic Quality: Eschar fter Cleansing: No ino No Exposed Structure sed: No Subcutaneous Tissue) Exposed: No sed: No sed: No ed: No d: No % Reduction in Area: -33.3% % Reduction in Volume: -33.9% Epithelialization: None Tunneling: No Undermining: No Foul Odor A Slough/Fibr Fascia Expo Fat Layer ( Tendon Expo Muscle Expo Joint Expos Bone Expose Treatment Notes Wound #5 (Right Toe Fifth) 1. Cleanse With Wound Cleanser 3. Primary Dressing Applied Foam 4. Secondary Dressing Roll Gauze Notes netting  Electronic Signature(s) Signed: 10/15/2019 4:40:42 PM By: Mikeal Hawthorne EMT/HBOT Entered By: Mikeal Hawthorne on 10/15/2019  14:45:55 -------------------------------------------------------------------------------- Wound Assessment Details Patient Name: Date of Service: SRIHITH, AQUILINO 10/15/2019 9:45 AM Medical Record [id number] Patient Account Number: [id number] Date of Birth/Sex: Treating RN: 02-27-1946 (74 y.o. [name]) Carlene Coria Primary Care Amani Nodarse: [name] Other Clinician: Referring Clement Deneault: Treating Ermagene Saidi/Extender:Robson, [name], [name] in Treatment: 10 Wound Status Wound Number: 6 Primary Venous Leg Ulcer Etiology: Wound Location: Right Lower Leg - Medial Wound Open Wounding Event: Blister Status: Date Acquired: 10/08/2019 Comorbid Cataracts, Anemia, Arrhythmia, Congestive Weeks Of Treatment: 1 History: Heart Failure, Hypertension, Type II Diabetes, Clustered Wound: No Gout, Neuropathy Wound Measurements Length: (cm) 0 % Reduct Width: (cm) 0 % Reduct Depth: (cm) 0 Epitheli Area: (cm) 0 Tunneli Volume: (cm) 0 Undermi Wound Description Classification: Full Thickness Without Exposed Support Foul Odo Structures Slough/F Wound Distinct, outline attached Margin: Exudate None Present Amount: Wound Bed Granulation Amount: None Present (0%) Necrotic Amount: None Present (0%) Fascia E Fat Laye Tendon E Muscle E Joint Ex Bone Exp r After Cleansing: No ibrino No Exposed Structure xposed: No r (Subcutaneous Tissue) Exposed: No xposed: No xposed: No posed: No osed: No ion in Area: 100% ion in Volume: 100% alization: None ng: No ning: No Treatment Notes Wound #6 (Right, Medial Lower Leg) 1. Cleanse With Wound Cleanser Soap and water 2. Periwound Care Barrier cream Moisturizing lotion 3. Primary Dressing Applied Calcium Alginate Ag Hydrofera Blue 4. Secondary Dressing ABD Pad Kerramax/Xtrasorb 6. Support Layer Applied 3 layer compression wrap Notes silver alginate to weeping site, distal lower leg and medial lower leg.  hydrofera blue classic to posterior lower lel Electronic Signature(s) Signed: 10/15/2019 6:01:30 PM By: Carlene Coria RN Entered By: Carlene Coria on 10/15/2019 10:27:21 -------------------------------------------------------------------------------- Wound Assessment Details Patient Name: Date of Service: ABDO, DENAULT 10/15/2019 9:45 AM Medical Record [id number] Patient Account Number: [id number] Date of Birth/Sex: Treating RN: [birth year] (74 y.o. [name]) Carlene Coria Primary Care Ajia Chadderdon: [name] Other Clinician: Referring Tiffanny Lamarche: Treating Chenel Wernli/Extender:Robson, [name], [name] in Treatment: 10 Wound Status Wound Number: 7 Primary Venous Leg Ulcer Etiology: Wound Location: Right Lower Leg - Posterior, Distal Wound Open Wounding Event: Blister Status: Date Acquired: 10/08/2019 Comorbid Cataracts, Anemia, Arrhythmia, Congestive Weeks Of Treatment: 1 History: Heart Failure, Hypertension, Type II Diabetes, Clustered Wound: No Gout, Neuropathy Wound Measurements Length: (cm) 0 % R Width: (cm) 0 % R Depth: (cm) 0 Epi Area: (cm) 0 Tu Volume: (cm) 0 Un Wound Description Full Thickness Without Exposed Support Fo Classification: Structures Sl Wound Distinct, outline attached Margin: Exudate None Present Amount: Wound Bed Granulation Amount: None Present (0%) Necrotic Amount: None Present (0%) Fa [facility] Levander Campion Treatment Notes Wound #7 (Right, Distal, Posterior Lower Leg) 1. Cleanse With Wound Cleanser Soap and water 2. Periwound Care Barrier cream Moisturizing lotion 3. Primary Dressing Applied Calcium Alginate Ag Hydrofera Blue 4. Secondary Dressing ABD Pad Kerramax/Xtrasorb 6. Support Layer Applied 3 layer compression wrap Notes silver alginate to weeping site, distal lower leg and medial lowe Electronic Signature(s) Signed: 10/15/2019 6:01:30 PM By: Carlene Coria RN Entered By: Carlene Coria on 01 ul Odor After  Cleansing: No ough/Fibrino No Exposed Structure scia Exposed: No t Layer (Subcutaneous Tissue) Exposed: No ndon Exposed: No scle Exposed: No int Exposed: No ne Exposed: No r leg. hydrofera blue classic to posterior lower lel /25/2021 10:27:39 eduction in Area: 100% eduction in Volume: 100% thelialization: Large (67-100%) nneling: No dermining:  No -------------------------------------------------------------------------------- Vitals Details Patient Name: Date of Service: IKECHUKWU, CERNY 10/15/2019 9:45 AM Medical Record [id number] Patient Account Number: [id number] Date of Birth/Sex: Treating RN: [birth year] (74 y.o. [name]) Carlene Coria Primary Care Shaunika Italiano: [name] Other Clinician: Referring Laticia Vannostrand: Treating Shaniqwa Horsman/Extender:Robson, [name], [name] in Treatment: 10 Vital Signs Time Taken: 10:06 Temperature (F): 98.2 Height (in): 67 Pulse (bpm): 66 Weight (lbs): 190 Respiratory Rate (breaths/min): 18 Body Mass Index (BMI): 29.8 Blood Pressure (mmHg): 153/77 Capillary Blood Glucose (mg/dl): 119 Reference Range: 80 - 120 mg / dl Notes CBG per patient Electronic Signature(s) Signed: 10/15/2019 6:01:30 PM By: Carlene Coria RN Entered By: Carlene Coria on 10/15/2019 10:07:05

## 2019-10-22 ENCOUNTER — Other Ambulatory Visit: Payer: Self-pay

## 2019-10-22 ENCOUNTER — Encounter (HOSPITAL_BASED_OUTPATIENT_CLINIC_OR_DEPARTMENT_OTHER): Payer: Medicare Other | Admitting: Internal Medicine

## 2019-10-22 DIAGNOSIS — M109 Gout, unspecified: Secondary | ICD-10-CM | POA: Diagnosis not present

## 2019-10-22 DIAGNOSIS — Z940 Kidney transplant status: Secondary | ICD-10-CM | POA: Insufficient documentation

## 2019-10-22 DIAGNOSIS — I4891 Unspecified atrial fibrillation: Secondary | ICD-10-CM | POA: Insufficient documentation

## 2019-10-22 DIAGNOSIS — I5032 Chronic diastolic (congestive) heart failure: Secondary | ICD-10-CM | POA: Insufficient documentation

## 2019-10-22 DIAGNOSIS — L97518 Non-pressure chronic ulcer of other part of right foot with other specified severity: Secondary | ICD-10-CM | POA: Insufficient documentation

## 2019-10-22 DIAGNOSIS — I2510 Atherosclerotic heart disease of native coronary artery without angina pectoris: Secondary | ICD-10-CM | POA: Diagnosis not present

## 2019-10-22 DIAGNOSIS — I87321 Chronic venous hypertension (idiopathic) with inflammation of right lower extremity: Secondary | ICD-10-CM | POA: Diagnosis not present

## 2019-10-22 DIAGNOSIS — L97212 Non-pressure chronic ulcer of right calf with fat layer exposed: Secondary | ICD-10-CM | POA: Insufficient documentation

## 2019-10-22 DIAGNOSIS — E11622 Type 2 diabetes mellitus with other skin ulcer: Secondary | ICD-10-CM | POA: Diagnosis not present

## 2019-10-22 DIAGNOSIS — S8011XD Contusion of right lower leg, subsequent encounter: Secondary | ICD-10-CM | POA: Insufficient documentation

## 2019-10-22 DIAGNOSIS — I110 Hypertensive heart disease with heart failure: Secondary | ICD-10-CM | POA: Insufficient documentation

## 2019-10-22 DIAGNOSIS — X58XXXD Exposure to other specified factors, subsequent encounter: Secondary | ICD-10-CM | POA: Insufficient documentation

## 2019-10-22 NOTE — Progress Notes (Addendum)
**Note [name]-Identified via Obfuscation** [name], [name] ([id number]) Visit Report for 10/22/2019 Arrival Information Details Patient Name: Date of Service: [name], [name] 10/22/2019 9:00 AM Medical Record [id number] Patient Account Number: [id number] Date of Birth/Sex: Treating RN: 08-Nov-1945 (74 y.o. [name] Primary Care Adea Geisel: [name] Other Clinician: Referring Mallerie Blok: Treating Laquinn Shippy/Extender:Robson, [name], [name] in Treatment: 11 Visit Information History Since Last Visit Added or deleted any No Patient Arrived: Ambulatory medications: Arrival Time: 09:12 Any new allergies or adverse No Accompanied By: self reactions: Transfer Assistance: None Had a fall or experienced change No Patient Identification Verified: Yes in Secondary Verification Process Yes activities of daily living that may Completed: affect Patient Has Alerts: Yes risk of falls: Patient Alerts: non Signs or symptoms of No compressable abuse/neglect since last visito Hospitalized since last visit: No Implantable device outside of the No clinic excluding cellular tissue based products placed in the center since last visit: Has Dressing in Place as Yes Prescribed: Has Compression in Place as Yes Prescribed: Has Footwear/Offloading in Place Yes as Prescribed: Right: Surgical Shoe with Pressure Relief Insole Pain Present Now: Yes Electronic Signature(s) Signed: 10/22/2019 6:01:02 PM By: Kela Millin Entered By: Kela Millin on 10/22/2019 09:13:15 -------------------------------------------------------------------------------- Compression Therapy Details Patient Name: Date of Service: Michael, [name] 10/22/2019 9:00 AM Medical Record [id number] Patient Account Number: [id number] Date of Birth/Sex: Treating RN: 1946-04-26 (73 y.o. [name] Primary Care Draydon Clairmont: [name] Other Clinician: Referring Kartier Bennison: Treating Kaylin Schellenberg/Extender:Robson,  [name], [name] in Treatment: 11 Compression Therapy Performed for Wound Wound #3 Right,Posterior Lower Leg Assessment: Performed By: Clinician [name], RN Compression Type: Rolena Infante Post Procedure Diagnosis Same as Pre-procedure Electronic Signature(s) Signed: 10/22/2019 6:39:44 PM By: [name] RN, BSN Entered By: [name] on 10/22/2019 09:56:54 -------------------------------------------------------------------------------- Compression Therapy Details Patient Name: Date of Service: Michael, [name] 10/22/2019 9:00 AM Medical Record [id number] Patient Account Number: [id number] Date of Birth/Sex: Treating RN: 07/01/1946 (73 y.o. [name] Primary Care Sanford Lindblad: [name] Other Clinician: Referring Williemae Muriel: Treating Suzetta Timko/Extender:Robson, [name], [name] in Treatment: 11 Compression Therapy Performed for Wound Wound #6 Right,Medial Lower Leg Assessment: Performed By: Clinician [name], RN Compression Type: Rolena Infante Post Procedure Diagnosis Same as Pre-procedure Electronic Signature(s) Signed: 10/22/2019 6:39:44 PM By: [name] RN, BSN Entered By: [name] on 10/22/2019 09:56:54 -------------------------------------------------------------------------------- Compression Therapy Details Patient Name: Date of Service: Michael, [name] 10/22/2019 9:00 AM Medical Record [id number] Patient Account Number: [id number] Date of Birth/Sex: Treating RN: [birth year] (73 y.o. [name] Primary Care Chellsea Beckers: [name] Other Clinician: Referring Jonathan Corpus: Treating Harmoni Lucus/Extender:Robson, [name], [name] in Treatment: 11 Compression Therapy Performed for Wound Wound #8 Right,Lateral Lower Leg Assessment: Performed By: Clinician [name], RN Compression Type: Rolena Infante Post Procedure Diagnosis Same as Pre-procedure Electronic  Signature(s) Signed: 10/22/2019 6:39:44 PM By: [name] RN, BSN Entered By: [name] on 10/22/2019 09:56:54 -------------------------------------------------------------------------------- Encounter Discharge Information Details Patient Name: Date of Service: Michael, [name] 10/22/2019 9:00 AM Medical Record [id number] Patient Account Number: [id number] Date of Birth/Sex: Treating RN: [birth year] (73 y.o. [name] Primary Care Donnarae Rae: [name] Other Clinician: Referring Hazel Leveille: Treating Burnett Spray/Extender:Robson, [name], [name] in Treatment: 11 Encounter Discharge Information Items Post Procedure Vitals Discharge Condition: Stable Temperature (F): 97.8 Ambulatory Status: Ambulatory Pulse (bpm): 53 Discharge Destination: Home Respiratory Rate (breaths/min): 19 Transportation: Private Auto Blood Pressure (mmHg): 165/81 Accompanied By: self Schedule Follow-up Appointment: Yes Clinical Summary of Care: Electronic Signature(s) Signed: 10/22/2019 6:01:21  PM By: [name] Entered By: [name] on 10/22/2019 10:28:09 -------------------------------------------------------------------------------- Lower Extremity Assessment Details Patient Name: Date of Service: [name], [name] 10/22/2019 9:00 AM Medical Record [id number] Patient Account Number: [id number] Date of Birth/Sex: Treating RN: [birth year] (74 y.o. [name] Primary Care Talib Headley: [name] Other Clinician: Referring Laira Penninger: Treating Rylan Kaufmann/Extender:Robson, [name], [name] in Treatment: 11 Edema Assessment Assessed: [Left: No] [Right: No] Edema: [Left: Ye] [Right: s] Calf Left: Right: Point of Measurement: 30 cm From Medial Instep cm 40.5 cm Ankle Left: Right: Point of Measurement: 10 cm From Medial Instep cm 24.5 cm Vascular Assessment Pulses: Dorsalis Pedis Palpable: [Right:Yes] Electronic  Signature(s) Signed: 10/22/2019 6:01:02 PM By: Kela Millin Entered By: Kela Millin on 10/22/2019 09:17:35 -------------------------------------------------------------------------------- Multi Wound Chart Details Patient Name: Date of Service: Michael, [name] 10/22/2019 9:00 AM Medical Record [id number] Patient Account Number: [id number] Date of Birth/Sex: Treating RN: 1946/04/07 (74 y.o. 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] Comorbid History: [3:Cataracts, Anemia, Arrhythmia, Congestive Heart Failure, Hypertension, Heart Failure, Hypertension, Heart Failure, Hypertension, Type II Diabetes, Gout, Neuropathy] [5:Cataracts, Anemia, Arrhythmia, Congestive Type II Diabetes, Gout,  Neuropathy] [6:Cataracts, Anemia, Arrhythmia, Congestive Type II Diabetes, Gout, Neuropathy] Date Acquired: [3:06/06/2019] [5:09/19/2019] [6:10/08/2019] Weeks of Treatment: [3:11] [5:4] [6:2] Wound Status: [3:Open] [5:Open] [6:Open] Measurements L x W x D [3:16x5.8x0.5] [5:1.2x1.2x0.1] [6:10.5x5.5x0.1] (cm) Area (cm) : [3:72.885] [5:1.131] [6:45.357] Volume (cm) : [3:36.442] [5:0.113] [6:4.536] % Reduction in Area: [3:-223.30%] [5:-92.00%] [6:-292.90%] % Reduction in Volume: [3:-438.90%] [5:-91.50%] [6:-292.70%] Classification: [3:Full Thickness Without Exposed  Support Structures] [5:Grade 2] [6:Full Thickness Without Exposed Support Structures] Exudate Amount: [3:Large] [5:Small] [6:Medium] Exudate Type: [3:Purulent] [5:Serous] [6:Serous] Exudate Color: [3:yellow, brown, green] [5:amber] [6:amber] Wound Margin: [3:Well defined, not attached Distinct, outline attached] [6:Distinct, outline attached] Granulation Amount: [3:Medium (34-66%)] [5:None Present (0%)] [6:Large (67-100%)] Granulation Quality: [3:Pink] [5:N/A] [6:Pink] Necrotic Amount: [3:Medium (34-66%)] [5:Large (67-100%)] [6:None Present (0%)] Necrotic Tissue: [3:Adherent Slough] [5:Eschar] [6:N/A] Exposed Structures: [3:Fat Layer (Subcutaneous Fascia: No Tissue) Exposed: Yes Fascia: No Tendon: No Muscle: No Joint: No Bone: No] [5:Fat Layer (Subcutaneous Tissue) Exposed: No Tendon: No Muscle: No Joint: No Bone: No] [6:Fat Layer (Subcutaneous Tissue) Exposed: Yes  Fascia: No Tendon: No Muscle: No Joint: No Bone: No] Epithelialization: [3:Small (1-33%)] [5:None] [6:None] Debridement: [3:N/A] [5:Debridement - Excisional] [6:N/A] Pre-procedure [3:N/A] [5:09:53] [6:N/A] Verification/Time Out Taken: Pain Control: [3:N/A] [5:Other] [6:N/A] Tissue Debrided: [3:N/A] [5:Necrotic/Eschar, Subcutaneous] [6:N/A] Level: [3:N/A] [5:Skin/Subcutaneous Tissue] [6:N/A] Debridement Area (sq cm):N/A [5:1.44] [6:N/A] Instrument: [3:N/A] [5:Curette] [6:N/A] Bleeding: [3:N/A] [5:Minimum] [6:N/A] Hemostasis Achieved: [3:N/A] [5:Pressure] [6:N/A] Procedural Pain: [3:N/A] [5:5] [6:N/A] Post Procedural Pain: [3:N/A] [5:3] [6:N/A] Debridement Treatment N/A [5:Procedure was tolerated] [6:N/A] Response: [5:well] Post Debridement [3:N/A] [5:1.2x1.2x0.1] [6:N/A] Measurements L x W x D (cm) Post Debridement [3:N/A] [5:0.113] [6:N/A] Volume: (cm) Procedures Performed: Compression Therapy [3:7] [5:Debridement 8] [6:Compression Therapy N/A] Photos: [3:No Photos] [5:No Photos] [6:N/A] Wound Location: [3:Right Lower  Leg - Posterior, Right, Lateral Lower Leg Distal] [6:N/A] Wounding Event: [3:Blister] [5:Blister] [6:N/A] 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] [5:Venous Leg Ulcer] [6:N/A] Comorbid History: [3:Cataracts, Anemia, Arrhythmia, Congestive  Heart Failure, Hypertension, Heart Failure, Hypertension, Type II Diabetes, Gout, Neuropathy] [5:Cataracts, Anemia, Arrhythmia, Congestive Type II Diabetes, Gout, Neuropathy] [6:N/A] Date Acquired: [3:10/08/2019] [5:10/22/2019] [6:N/A] Weeks of Treatment: [3:2] [5:0] [6:N/A] Wound Status: [3:Open] [5:Open] [6:N/A] Measurements L x W x D [3:1.5x5x0.1] [5:2.7x4.3x0.1] [6:N/A] (cm) Area (cm) : [3:5.89] [5:9.118] [6:N/A] Volume (cm) : [3:0.589] [5:0.912] [6:N/A] % Reduction in Area: [3:-150.00%] [5:0.00%] [6:N/A] % Reduction in Volume: [3:-149.60%] [5:0.00%] [6:N/A] Classification: [3:Full Thickness Without Exposed Support Structures Exposed Support Structures] [5:Full Thickness Without] [6:N/A] Exudate Amount: [3:Medium] [5:Small] [6:N/A] Exudate Type: [3:Serous] [5:Serous] [6:N/A] Exudate Color: [3:amber] [5:amber] [6:N/A] Wound Margin: [3:Distinct, outline attached Distinct, outline attached N/A] Granulation Amount: [3:Large (67-100%)] [5:Large (67-100%)] [6:N/A] Granulation Quality: [3:Pink] [5:Pink] [6:N/A] Necrotic Amount: [3:None Present (0%)] [5:None Present (0%)] [6:N/A] Necrotic Tissue: [3:N/A] [5:N/A] [6:N/A] Exposed Structures: [3:Fat Layer (Subcutaneous Fat Layer (Subcutaneous N/A Tissue) Exposed: Yes Fascia: No Tendon: No Muscle: No Joint: No Bone: No] [5:Tissue) Exposed: Yes Fascia: No Tendon: No Muscle: No Joint: No Bone: No] Epithelialization: [3:None] [5:None] [6:N/A] Debridement: [3:N/A] [5:N/A] [6:N/A] Pain Control: [3:N/A] [5:N/A] [6:N/A] Tissue Debrided: [3:N/A] [5:N/A] [6:N/A] Level: [3:N/A] [5:N/A] [6:N/A] Debridement Area (sq cm):N/A [5:N/A] [6:N/A] Instrument: [3:N/A] [5:N/A] [6:N/A] Bleeding: [3:N/A] [5:N/A] [6:N/A] Hemostasis  Achieved: [3:N/A] [5:N/A] [6:N/A] Procedural Pain: [3:N/A] [5:N/A] [6:N/A] Post Procedural Pain: [3:N/A] [5:N/A] [6:N/A] Debridement Treatment N/A [5:N/A] [6:N/A] Response: Post Debridement [3:N/A] [5:N/A] [6:N/A] Measurements L x W x D (cm) Post Debridement [3:N/A] [5:N/A] [6:N/A] Volume: (cm) Procedures Performed: N/A [5:Compression Therapy] [6:N/A] Treatment Notes Wound #3 (Right, Posterior Lower Leg) 1. Cleanse With Wound Cleanser Soap and water 2. Periwound Care Barrier cream Moisturizing lotion 3. Primary Dressing Applied Hydrofera Blue 4. Secondary Dressing ABD Pad Dry Gauze Kerramax/Xtrasorb 6. Support Layer Production assistant, radio Notes hydrofera blue classic as primary. Wound #5 (Right Toe Fifth) 1. Cleanse With Wound Cleanser Soap and water 3. Primary Dressing Applied Calcium Alginate Ag 4. Secondary Dressing Dry Gauze 5. Secured With Medipore tape 7. Footwear/Offloading device applied Surgical shoe Wound #6 (Right, Medial Lower Leg) 1. Cleanse With Wound Cleanser Soap and water 2. Periwound Care Barrier cream Moisturizing lotion 3. Primary Dressing Applied Calcium Alginate Ag 4. Secondary Dressing ABD Pad Kerramax/Xtrasorb 6. Support Layer [employer] 7. Footwear/Offloading device applied Surgical shoe Wound #7 (Right, Distal, Posterior Lower Leg) 1. Cleanse With Wound Cleanser Soap and water 2. Periwound Care Barrier cream Moisturizing lotion 3. Primary Dressing Applied Calcium Alginate Ag 4. Secondary Dressing ABD Pad Kerramax/Xtrasorb 6. Support Layer [employer] 7. Footwear/Offloading device applied Surgical shoe Wound #8 (Right, Lateral Lower Leg) 1. Cleanse With Wound Cleanser Soap and water 2. Periwound Care Barrier cream Moisturizing lotion 3. Primary Dressing Applied Calcium Alginate Ag 4. Secondary Dressing ABD Pad Kerramax/Xtrasorb 6. Support Layer [employer] 7. Footwear/Offloading device  applied Surgical shoe Electronic Signature(s) Signed: 10/22/2019 6:41:12 PM By: [name] MD Entered By: [name] on 10/22/2019 10:52:10 -------------------------------------------------------------------------------- Multi-Disciplinary Care Plan Details Patient Name: Date of Service: Michael, [name] 10/22/2019 9:00 AM Medical Record [id number] Patient Account Number: [id number] Date of Birth/Sex: Treating RN: 31-Jul-1946 (73 y.o. [name] Primary Care Donella Pascarella: [name] Other Clinician: Referring Danner Paulding: Treating Sofija Antwi/Extender:Robson, [name], [name] in Treatment: 11 Active Inactive Nutrition Nursing Diagnoses: Impaired glucose control: actual or potential Potential for alteratiion in Nutrition/Potential for imbalanced nutrition Goals: Patient/caregiver agrees to and verbalizes understanding of need to use nutritional supplements and/or vitamins as prescribed Date Initiated: 08/06/2019 Date Inactivated: 09/03/2019 Target Resolution Date: 09/07/2019 Goal Status: Met  Patient/caregiver will maintain therapeutic glucose control Date Initiated: 08/06/2019 Target Resolution Date: 11/02/2019 Goal Status: Active Interventions: Assess HgA1c results as ordered upon admission and as needed Assess patient nutrition upon admission and as needed per policy Provide education on elevated blood sugars and impact on wound healing Provide education on nutrition Treatment Activities: Education provided on Nutrition : 09/17/2019 Notes: Venous Leg Ulcer Nursing Diagnoses: Knowledge deficit related to disease process and management Goals: Patient will maintain optimal edema control Date Initiated: 08/06/2019 Target Resolution Date: 11/02/2019 Goal Status: Active Patient/caregiver will verbalize understanding of disease process and disease management Target Resolution Date Initiated: 08/06/2019 Date Inactivated: 09/03/2019 Date:  09/07/2019 Goal Status: Met Interventions: Assess peripheral edema status every visit. Compression as ordered Provide education on venous insufficiency Notes: Wound/Skin Impairment Nursing Diagnoses: Impaired tissue integrity Goals: Patient/caregiver will verbalize understanding of skin care regimen Date Initiated: 08/06/2019 Target Resolution Date: 11/02/2019 Goal Status: Active Ulcer/skin breakdown will have a volume reduction of 30% by week 4 Date Initiated: 08/06/2019 Date Inactivated: 09/03/2019 Target12/18/2020 Resolution Date: Unmet Reason: PAD, Goal Status: Unmet necrotic surface Interventions: Assess patient/caregiver ability to obtain necessary supplies Assess patient/caregiver ability to perform ulcer/skin care regimen upon admission and as needed Assess ulceration(s) every visit Provide education on ulcer and skin care Notes: Electronic Signature(s) Signed: 10/22/2019 6:39:44 PM By: [name] RN, BSN Entered By: [name] on 10/22/2019 18:12:30 -------------------------------------------------------------------------------- Pain Assessment Details Patient Name: Date of Service: Michael, [name] 10/22/2019 9:00 AM Medical Record [id number] Patient Account Number: [id number] Date of Birth/Sex: Treating RN: 03-29-1946 (74 y.o. [name] Primary Care Casper Pagliuca: [name] Other Clinician: Referring Nur Rabold: Treating Myrle Wanek/Extender:Robson, [name], [name] in Treatment: 11 Active Problems Location of Pain Severity and Description of Pain Patient Has Paino Yes Site Locations Pain Location: Generalized Pain, Pain in Ulcers With Dressing Change: Yes Duration of the Pain. Constant / Intermittento Constant Rate the pain. Current Pain Level: 6 Worst Pain Level: 9 Least Pain Level: 4 Tolerable Pain Level: 4 Character of Pain Describe the Pain: Burning, Sharp, Stabbing Pain Management and  Medication Current Pain Management: Electronic Signature(s) Signed: 10/22/2019 6:01:02 PM By: Kela Millin Entered By: Kela Millin on 10/22/2019 09:14:26 -------------------------------------------------------------------------------- Patient/Caregiver Education Details Patient Name: Date of Service: Michael, [name] 2/1/2021andnbsp9:00 AM Medical Record [id number] Patient Account Number: [id number] Date of Birth/Gender: Treating RN: 05-03-1946 (74 y.o. [name] Primary Care Physician: [name] Other Clinician: Referring Physician: Treating Physician/Extender:Robson, [name], [name] in Treatment: 11 Education Assessment Education Provided To: Patient Education Topics Provided Wound/Skin Impairment: Methods: Explain/Verbal Responses: State content correctly [employer]) Signed: 10/22/2019 6:39:44 PM By: [name] RN, BSN Entered By: [name] on 10/22/2019 18:12:40 -------------------------------------------------------------------------------- Wound Assessment Details Patient Name: Date of Service: Michael, [name] 10/22/2019 9:00 AM Medical Record [id number] Patient Account Number: [id number] Date of Birth/Sex: Treating RN: 1946/01/12 (73 y.o. [name] Primary Care Arnesha Schiraldi: [name] Other Clinician: Referring Ubah Radke: Treating Baljit Liebert/Extender:Robson, [name], [name] in Treatment: 11 Wound Status Wound Number: 3 Primary Venous Leg Ulcer Etiology: Wound Location: Right Lower Leg - Posterior Wound Open Wounding Event: Trauma Status: Date Acquired: 06/06/2019 Comorbid Cataracts, Anemia, Arrhythmia, Congestive Weeks Of Treatment: 11 History: Heart Failure, Hypertension, Type II Diabetes, Clustered Wound: No Gout, Neuropathy Photos Wound Measurements Length: (cm) 16 % Reduct Width: (cm) 5.8 % Reduct Depth: (cm) 0.5 Epitheli Area: (cm) 72.885  Tunneli Volume: (cm) 36.442 Undermi Wound Description Classification: Full Thickness Without Exposed Support Foul Odo Structures Classification: Structures  Slough Wound Well defined, not attached Margin: Exudate Large Amount: Exudate Purulent Type: Exudate yellow, brown, green Color: Wound Bed Granulation Amount: Medium (34-66%) Granulation Quality: Pink Fascia Necrotic Amount: Medium (34-66%) Fat La Necrotic Quality: Adherent Slough Tendon Muscle Joint Bone E r After Cleansing: No /Fibrino Yes Exposed Structure Exposed: No yer (Subcutaneous Tissue) Exposed: Yes Exposed: No Exposed: No Exposed: No xposed: No ion in Area: -223.3% ion in Volume: -438.9% alization: Small (1-33%) ng: No ning: No Electronic Signature(s) Signed: 10/30/2019 4:28:10 PM By: Mikeal Hawthorne EMT/HBOT Signed: 11/15/2019 2:25:48 PM By: [name] RN, BSN Previous Signature: 10/22/2019 6:39:44 PM Version By: [name] RN, BSN Entered By: Mikeal Hawthorne on 10/30/2019 14:58:23 -------------------------------------------------------------------------------- Wound Assessment Details Patient Name: Date of Service: [name], [name] 10/22/2019 9:00 AM Medical Record [id number] Patient Account Number: [id number] Date of Birth/Sex: Treating RN: 04-23-1946 (74 y.o. [name] Primary Care Jaxzen Vanhorn: [name] Other Clinician: Referring Marbella Markgraf: Treating Satine Hausner/Extender:Robson, [name], [name] in Treatment: 11 Wound Status Wound Number: 5 Primary Diabetic Wound/Ulcer of the Lower Extremity Etiology: Wound Location: Right Toe Fifth Wound Open Wounding Event: Blister Status: Date Acquired: 09/19/2019 Comorbid Cataracts, Anemia, Arrhythmia, Congestive Weeks Of Treatment: 4 History: Heart Failure, Hypertension, Type II Diabetes, Clustered Wound: No Gout, Neuropathy Photos Wound Measurements Length: (cm) 1.2 Width: (cm) 1.2 Depth: (cm)  0.1 Area: (cm) 1.131 Volume: (cm) 0.113 Wound Description Classification: Grade 2 Wound Margin: Distinct, outline attached Exudate Amount: Small Exudate Type: Serous Exudate Color: amber Wound Bed Granulation Amount: None Present (0%) Necrotic Amount: Large (67-100%) Necrotic Quality: Eschar After Cleansing: No brino No Exposed Structure posed: No (Subcutaneous Tissue) Exposed: No posed: No posed: No osed: No sed: No % Reduction in Area: -92% % Reduction in Volume: -91.5% Epithelialization: None Tunneling: No Undermining: No Foul Odor Slough/Fi Fascia Ex Fat Layer Tendon Ex Muscle Ex Joint Exp Bone Expo Electronic Signature(s) Signed: 10/30/2019 4:28:10 PM By: Mikeal Hawthorne EMT/HBOT Signed: 11/15/2019 2:25:48 PM By: [name] RN, BSN Previous Signature: 10/22/2019 6:39:44 PM Version By: [name] RN, BSN Entered By: Mikeal Hawthorne on 10/30/2019 14:43:18 -------------------------------------------------------------------------------- Wound Assessment Details Patient Name: Date of Service: [name], [name] 10/22/2019 9:00 AM Medical Record [id number] Patient Account Number: [id number] Date of Birth/Sex: Treating RN: 1946-06-08 (73 y.o. [name] Primary Care Avis Tirone: [name] Other Clinician: Referring Bryce Kimble: Treating Wilton Thrall/Extender:Robson, [name], [name] in Treatment: 11 Wound Status Wound Number: 6 Primary Venous Leg Ulcer Etiology: Wound Location: Right Lower Leg - Medial Wound Open Wounding Event: Blister Wounding Event: Blister Status: Date Acquired: 10/08/2019 Comorbid Cataracts, Anemia, Arrhythmia, Congestive Weeks Of Treatment: 2 History: Heart Failure, Hypertension, Type II Diabetes, Clustered Wound: No Gout, Neuropathy Photos Wound Measurements Length: (cm) 10.5 % Reduct Width: (cm) 5.5 % Reduct Depth: (cm) 0.1 Epitheli Area: (cm) 45.357 Tunneli Volume: (cm) 4.536  Undermi Wound Description Classification: Full Thickness Without Exposed Support Foul Odo Structures Slough/F Wound Distinct, outline attached Margin: Exudate Medium Amount: Exudate Serous Type: Exudate amber Color: Wound Bed Granulation Amount: Large (67-100%) Granulation Quality: Pink Fascia E Necrotic Amount: None Present (0%) Fat Laye Tendon E Muscle E Joint Ex Bone Exp r After Cleansing: No ibrino No Exposed Structure xposed: No r (Subcutaneous Tissue) Exposed: Yes xposed: No xposed: No posed: No osed: No ion in Area: -292.9% ion in Volume: -292.7% alization: None ng: No ning: No Electronic Signature(s) Signed: 10/30/2019 4:28:10 PM By: Mikeal Hawthorne EMT/HBOT Signed: 11/15/2019 2:25:48 PM By: [name] RN, BSN Previous Signature: 10/22/2019 6:39:44 PM  Version By: [name] RN, BSN Entered By: Mikeal Hawthorne on 10/30/2019 14:53:54 -------------------------------------------------------------------------------- Wound Assessment Details Patient Name: Date of Service: KADENCE, MIMBS 10/22/2019 9:00 AM Medical Record [id number] Patient Account Number: [id number] Date of Birth/Sex: Treating RN: [birth year] (74 y.o. M) Primary Care Ugo Thoma: [name] Other Clinician: Referring Dasan Hardman: Treating Marianny Goris/Extender:Robson, [name], [name] in Treatment: 11 Wound Status Wound Number: 7 Primary Venous Leg Ulcer Etiology: Wound Location: Right Lower Leg - Posterior, Distal Wound Open Wounding Event: Blister Status: Date Acquired: 10/08/2019 Comorbid Cataracts, Anemia, Arrhythmia, Congestive Weeks Of Treatment: 2 History: Heart Failure, Hypertension, Type II Diabetes, Clustered Wound: No Gout, Neuropathy Photos Wound Measurements Length: (cm) 1.5 % Reduc Width: (cm) 5 % Reduc Depth: (cm) 0.1 Epithel Area: (cm) 5.89 Tunnel Volume: (cm) 0.589 Underm Wound Description Classification: Full Thickness Without  Exposed Support Structures Wound Distinct, outline attached Margin: Exudate Medium Amount: Exudate Serous Type: Exudate amber Color: Wound Bed Granulation Amount: Large (67-100%) Granulation Quality: Pink Necrotic Amount: None Present (0%) Foul Odor After Cleansing: No Slough/Fibrino No Exposed Structure Fascia Exposed: No Fat Layer (Subcutaneous Tissue) Exposed: Yes Tendon Exposed: No Muscle Exposed: No Joint Exposed: No Bone Exposed: No tion in Area: -150% tion in Volume: -149.6% ialization: None ing: No ining: No Electronic Signature(s) Signed: 10/30/2019 4:28:10 PM By: Mikeal Hawthorne EMT/HBOT Previous Signature: 10/22/2019 6:01:02 PM Version By: Kela Millin Previous Signature: 10/22/2019 6:39:44 PM Version By: [name] RN, BSN Previous Signature: 10/22/2019 6:39:44 PM Version By: [name] RN, BSN Entered By: Mikeal Hawthorne on 10/30/2019 14:53:24 -------------------------------------------------------------------------------- Wound Assessment Details Patient Name: Date of Service: JOHNMICHAEL, MELHORN 10/22/2019 9:00 AM Medical Record [id number] Patient Account Number: [id number] Date of Birth/Sex: Treating RN: Dec 03, 1945 (73 y.o. [name] Primary Care Damarkus Balis: [name] Other Clinician: Referring Marion Rosenberry: Treating Sandrea Boer/Extender:Robson, [name], [name] in Treatment: 11 Wound Status Wound Number: 8 Primary Venous Leg Ulcer Etiology: Wound Location: Right Lower Leg - Lateral Wound Open Wounding Event: Blister Status: Date Acquired: 10/22/2019 Comorbid Cataracts, Anemia, Arrhythmia, Congestive Weeks Of Treatment: 0 History: Heart Failure, Hypertension, Type II Diabetes, Clustered Wound: No Gout, Neuropathy Photos Wound Measurements Length: (cm) 2.7 % Reduct Width: (cm) 4.3 % Reduct Depth: (cm) 0.1 Epitheli Area: (cm) 9.118 Tunneli Volume: (cm) 0.912 Undermi Wound Description Full Thickness  Without Exposed Support Foul Odo Classification: Structures Slough/F Wound Distinct, outline attached Margin: Exudate Small Amount: Exudate Serous Type: Exudate amber Color: Wound Bed Granulation Amount: Large (67-100%) Granulation Quality: Pink Fascia E Necrotic Amount: None Present (0%) Fat Layer Tendon Exp Muscle Exp Joint Expo Bone Expos r After Cleansing: No ibrino No Exposed Structure xposed: No (Subcutaneous Tissue) Exposed: Yes osed: No osed: No sed: No ed: No ion in Area: 0% ion in Volume: 0% alization: None ng: No ning: No Electronic Signature(s) Signed: 10/30/2019 4:28:10 PM By: Mikeal Hawthorne EMT/HBOT Signed: 11/15/2019 2:25:48 PM By: [name] RN, BSN Previous Signature: 10/22/2019 6:39:44 PM Version By: [name] RN, BSN Entered By: Mikeal Hawthorne on 10/30/2019 14:47:37 -------------------------------------------------------------------------------- Vitals Details Patient Name: Date of Service: JUSTINIAN, MIANO 10/22/2019 9:00 AM Medical Record [id number] Patient Account Number: [id number] Date of Birth/Sex: Treating RN: 11/19/1945 (74 y.o. [name] Primary Care Anamari Galeas: [name] Other Clinician: Referring Johann Gascoigne: Treating Chinonso Linker/Extender:Robson, [name], [name] in Treatment: 11 Vital Signs Time Taken: 09:10 Temperature (F): 97.8 Height (in): 67 Pulse (bpm): 53 Weight (lbs): 190 Respiratory Rate (breaths/min): 19 Body Mass Index (BMI): 29.8 Blood Pressure (mmHg): 165/81 Capillary Blood Glucose (  mg/dl): 119 Reference Range: 80 - 120 mg / dl Notes patient stated CBG this morning was 119 Electronic Signature(s) Signed: 10/22/2019 6:01:02 PM By: Kela Millin Entered By: Kela Millin on 10/22/2019 09:14:00

## 2019-10-22 NOTE — Progress Notes (Signed)
[name], [name] ([id number]) Visit Report for 10/22/2019 Debridement Details Patient Name: Date of Service: [name], [name] 10/22/2019 9:00 AM Medical Record [id number] Patient Account Number: [id number] Date of Birth/Sex: Treating RN: 07/16/1946 (74 y.o. M) Primary Care Provider: [name] Other Clinician: Referring Provider: Treating Provider/Extender:Kansas Spainhower, [name], [name] in Treatment: 11 Debridement Performed for Wound #5 Right Toe Fifth Assessment: Performed By: Physician Ricard Dillon., MD Debridement Type: Debridement Severity of Tissue Pre Fat layer exposed Debridement: Level of Consciousness (Pre- Awake and Alert procedure): Pre-procedure Verification/Time Out Taken: Yes - 09:53 Start Time: 09:53 Pain Control: Other : Benzocaine 20% Total Area Debrided (L x W): 1.2 (cm) x 1.2 (cm) = 1.44 (cm) Tissue and other material Viable, Non-Viable, Eschar, Subcutaneous debrided: Level: Skin/Subcutaneous Tissue Debridement Description: Excisional Instrument: Curette Bleeding: Minimum Hemostasis Achieved: Pressure End Time: 09:54 Procedural Pain: 5 Post Procedural Pain: 3 Response to Treatment: Procedure was tolerated well Level of Consciousness Awake and Alert (Post-procedure): Post Debridement Measurements of Total Wound Length: (cm) 1.2 Width: (cm) 1.2 Depth: (cm) 0.1 Volume: (cm) 0.113 Character of Wound/Ulcer Post Improved Debridement: Severity of Tissue Post Debridement: Fat layer exposed Post Procedure Diagnosis Same as Pre-procedure Electronic Signature(s) Signed: 10/22/2019 6:41:12 PM By: [name] MD Entered By: [name] on 10/22/2019 10:52:24 -------------------------------------------------------------------------------- HPI Details Patient Name: Date of Service: [name], [name] 10/22/2019 9:00 AM Medical Record [id number] Patient Account Number: [id number] Date of Birth/Sex: Treating  RN: 1946/01/19 (73 y.o. M) Primary Care Provider: [name] Other Clinician: Referring Provider: Treating Provider/Extender:Mikie Misner, [name], [name] in Treatment: 11 History of Present Illness HPI Description: [name] HPI Description: 74 year old gentleman who was recently seen by his nephrologist [name], and noted to have a wound on his left lower extremity which was lacerated 2 months ago and now has reopened. The patient's left shin has a ulceration with some exudate but no evidence of infection and he was referred to [place] for further care as it was known that the patient has had some peripheral vascular disease in the past. Past medical history significant for chronic kidney disease, atrial fibrillation, diabetes mellitus,status post kidney transplant in 1983 and 2005, a week fistula graft placement, status post previous bowel surgery. he works as a [name], [name] and is active and on his feet for a long while. 10/06/2015 -- x-ray of the left tibia and fibula shows no evidence of osteomyelitis. The patient has also had Doppler studies of his extremity and is awaiting the appointment with the vascular surgeon. We have not yet received these reports. 10/13/2015 -- lower extremity venous duplex reflux evaluation shows reflux in the left common femoral vein, left saphenofemoral junction and the proximal greater saphenous vein extending to the proximal calf. There is also reflux in the left proximal to mid small saphenous vein. Arterial duplex studies done showed the resting ABI was not applicable due to tibial artery medial calcification. 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 The right TBI was 1 which is normal and the left ABI was 1 which is normal. Patient has otherwise been doing fine and has been compliant with his  dressings. 10/20/2015 -- He was seen by [name] recently for a vascular opinion on 10/15/2015. His left lower extremity venous insufficiency duplex study revealed GSV reflux,SS vein reflux and deep venous reflux in the common femoral vein. His ABIs were non compressible and  his TBI on the right was 1.01 and on the left was 0.80. He was asked to continue with the wound care with compressive therapy followed by EVLA of the left GS vein 3 months. He recommended 20-30 mm thigh-high compression stockings and the need for a three-month trial of this. The patient had an Unna boot applied at the vascular office but he could not tolerate this with a lot of pain and issues with his toes and hence came here on Friday for removal of this and we reapplied a 2 layer compression. 11/10/2015 -- patient still has not purchased his 20-30 mm thigh-high compression stockings as prescribed by [name]. Readmission: 08/08/18 on evaluation today patient presents for readmission concerning a new injury to the left anterior lower extremity. He was previously seen in 2017 here in our clinic. He states that he has done fairly well since that point. Nonetheless he is having at this time some pain but states that he hit this on a table that fell over and actually struck his leg. This appears to have pulled back some of his skin which folded in on itself and is causing some difficulty as far as that is concerned. There does not appear to be any evidence of infection at this time. No fevers, chills, nausea, or vomiting noted at this time. He's been using dressings on his own currently without complication. 08/15/18 on evaluation today patient actually appears to be doing somewhat better in regard to his wound of the lower [name] when compared to the first visit last week. I had to do a much more extensive debridement at that time it does appear that I'm gonna have to perform some debridement today but it does not look  to be as extensive by any means. Nonetheless fortunately he does not show any signs of infection he does have discomfort at this site. I believe based on what I'm seeing currently he may benefit from Iodoflex to help keep the wound bed clean. Patient tolerated therapy without complication. Upon evaluation today the patient actually appears to be doing excellent in regard to his left lower extremity ulcer. This is much better than the previous two visits where he had a lot of necrotic tissue around the edge of the wound simply due to the fact that again there was a significant skin tear where the edge had been cleared away prior to reattaching and being able to heal appropriately. He seems to be doing much better at this point. 08/28/18 on evaluation today the patient's wound actually does appear to be showing signs of improvement. With that being said though he is improving he would likely note even greater improvement if we were able to sharply debride the wound. Nonetheless this caused him to much discomfort he tells me. 09/04/18 on evaluation today patient actually appears to be showing signs of improvement in regard to his left lower extremity ulcer. He has been tolerating the dressing changes including the wrap although he tells me at this point that the burning does last for a couple of days even with just the Iodoflex. I was afraid that this may been part of the issue that he was having with discomfort. It does seem to be the case. Nonetheless he shows no signs of evidence of infection at this time which is good news. No fevers chills noted ADMISSION to [name] 10/05/2018 This is a patient who was cared for in 2017 and again in the fall of this year  at our sister clinic and [facility]. He actually lives in [place] in [place]. We have been dealing with an apparent traumatic area on the left anterior tibial area. This is been present for the last several months. He was  supposed to be using Iodoflex Kerlix and Coban however he was hospitalized from 09/05/2018 through 09/11/2018 with delirium secondary to pneumonia. Since then he is only been putting Vaseline gauze on this without compression. He also has a more recent skin tear on the dorsal right hand that may have only happened in the last week. The patient had arterial studies done in 2017 in January which was 3 years ago. At that point he had noncompressible ABIs but really quite good TBI's both normal. Triphasic waveforms on the right monophasic at the left posterior tibial but triphasic at the left dorsalis pedis. His ABIs in our clinic today were both noncompressible 1/23; the patient has wounds on the right dorsal hand just distal to the wrist and on the left anterior lower extremity. Both of these look very healthy he is using Hydrofera Blue 1/30; left anterior lower extremity wound much smaller. Healthy looking surface. The laceration area just distal to the wrist on the dorsal hand on the right is also just about closed I used Hydrofera Blue here 2/6; left anterior lower extremity wound is much smaller but still open. The laceration area just distal to the wrist on the dorsal hand is fully epithelialized. 2/13; the patient's anterior lower extremity wound is closed. The laceration just distal to the wrist on the dorsal hand is also fully epithelialized and closed. The patient has external compression stockings which I think are 20/30 READMISSION 08/06/2019 [name] is a 74 year old [name] with had several times previously in our clinic. He is a diabetic with a history of chronic renal insufficiency status post kidney transplant in 1983 and again in 2005. He was then in 2017 with a laceration on the left lower extremity. He was worked up at the time with arterial studies and reflux studies. The arterial studies showed ABIs to be noncompressible but TBI's were within normal limits. I do not have the  reflux studies at the moment. He was also sent here in 2019 with a left lower extremity wound and then again in 2020 with left lower extremity trauma a skin tear on the wrist. He was discharged with 20/30 stockings identified from myself that that might not be enough compression. Nevertheless he states he was wearing these fairly reliably. In September he had a fall with a substantial bruise in the area of the wound. He says he saw orthopedics and they told him there was some muscle strain sometime it later this opened into a wound. He has a fairly substantial wound on the right posterior calf. Satellite areas around this including medially and posteriorly. He has not worn his stockings since the injury Past medical history; includes chronic renal failure secondary to diabetes with kidney transplant x2, atrial fibrillation, heart failure with preserved ejection fraction, coronary artery disease. ABIs on the right in our clinic were once again noncompressible 08/13/2019 on evaluation today patient appears to be doing decently well with regard to his wound compared to last week's evaluation. Unfortunately he is still having a lot of discomfort at this point which is I think in some part due to the 3 layer compression wrap which is a little bit stronger I think for him. When he was here before we actually utilized a Kerlix and Coban wrap which  he states seemed to got a little bit better. Nonetheless I think we can probably drop back to this in light of the discomfort that he had. Nonetheless the pain was not really right around the wound itself as much as it was around the ankle in particular. The Iodoflex does seem to have done well for him As the wound is appearing somewhat better today which is excellent news. 11/30; still complaining of a lot of pain. Apparently arterial studies I ordered 2 weeks ago are below. I do not believe we have an appointment with vein and vascular as of yet; ABI  Findings: +---------+------------------+-----+----------+--------+ Right Rt Pressure (mmHg)IndexWaveform Comment  +---------+------------------+-----+----------+--------+ PTA >254 1.50 monophasic  +---------+------------------+-----+----------+--------+ DP >254 1.50 monophasic  +---------+------------------+-----+----------+--------+ Great Toe68 0.40 Abnormal   +---------+------------------+-----+----------+--------+ +---------+------------------+-----+----------+-------+ Left Lt Pressure (mmHg)IndexWaveform Comment +---------+------------------+-----+----------+-------+ Brachial 169     +---------+------------------+-----+----------+-------+ PTA >254 1.50 monophasic  +---------+------------------+-----+----------+-------+ DP >254 1.50 monophasic  +---------+------------------+-----+----------+-------+ Great Toe40 0.24 Abnormal   +---------+------------------+-----+----------+-------+ Pedal arteries appear hyperemic. Patient refused Brachial pressure in the right arm. Summary: Right: Resting right ankle-brachial index indicates noncompressible right lower extremity arteries. The right toe- brachial index is abnormal. Left: Resting left ankle-brachial index indicates noncompressible left lower extremity arteries. The left toe-brachial index is abnormal. He is also having considerably more swelling in his left calf. This was not there when I last saw him 2 weeks ago. He tells me that some of the home health compression wraps have been slipping down and that may be the issue here however a month I am uncertain 16/9; sees vascular on December 22. Still complaining of a lot of pain. DVT study I did last time was negative for DVT 12/14; still complaining of pain which if this is arterial is certainly claudication and rest keeps him uncomfortable at night. He has an appointment with vein and vascular on December 22. Wound surface is  better using Iodoflex. Once the surface of this is satisfactory and we have exhausted the vascular route. Perhaps an advanced treatment option. He has a configuration of the venous ulceration although his arterial studies are not very good. The other issue is the patient has a transplanted kidney. This will make angiography difficult and challenging issue 12/21; still complaining of pain and drainage. We are using Iodoflex on the wound under compression. He sees Dr. Donnetta Hutching tomorrow to evaluate his noninvasive studies noted above. He has a transplanted kidney further complicating the options for angiography. 12/28; still using Iodoflex under compression. I have Dr. Luther Parody note from 12/22. he noted arterial studies revealing monophasic waveforms at the pedal vessels bilaterally and calcified vessels making the ABI unreliable. He did not comment on the reduced TBI's. He felt these were venous wounds based on the palpable dorsalis pedis pulse. 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 And they arranged for formal venous duplex with reflux studies in the next several weeks. Follow-up with either Scot Dock or Dr. Oneida Alar 09/24/2019 still using Iodoflex under compression. He has an appointment with Dr. Doren Custard on 1/7 with regards to his venous disease. The patient was not felt to have a primary arterial problem for the nonhealing of his wound. We did attempt to wrap him with 3 layer when he first came into the clinic he complained of a lot of pain in the ankle although this may have been because the dressing fell down somewhat. He has far too edema fluid in the right leg for a good prognosis about healing this wound He comes in today with an  excoriation on the bottom part of his right fifth toe. He thinks he may have done this taking off his clothes and that something got caught on the toe. There is no open wound however the toe itself is very painful 1/11; we are using Iodoflex under compression. The  wound bed is clean. He went on to see Dr. Doren Custard on 09/27/2019 he again feels that the patient's arterial supply is adequate. He feels that he might benefit from right greater saphenous vein ablation for a venous ulcer. In the meantime the area that he was complaining about last week on the right fifth toe. An x-ray that I ordered showed marked soft tissue swelling along the distal aspect of the right foot but there was no evidence of osteomyelitis. He comes in today with the fifth toenail just about coming off. He has black eschar underneath the toe on the plantar aspect. The toe was swollen red and very painful. In the right setting this could be a significant soft tissue infection versus ischemia of the toe itself. He did not show this to Dr. Doren Custard 1/18; I am using Iodoflex under compression a large wound on the right posterior calf. He had his right greater saphenous vein ablation by Dr. Doren Custard although I am not sure that is the only problem here. The right fifth toe which was possibly trauma 2 weeks ago continues to be exceptionally painful with a necrotic tip. Maybe not quite as swollen. I started him empirically on Augmentin 5/125 1 p.o. twice daily last week after discussing this with Dr. Loletha Grayer of nephrology. Perhaps somewhat better this week but not as good as I was hoping. A plain x-ray was negative. He comes in today with an area on the medial right calf that was blistered and now open. In looking at things he appears to be systemically fluid volume overloaded. He has a transplanted kidney. He states he takes his Lasix variably when he has appointments he tries not to take it the later I am really not certain if he takes this reliably. However he has far too much edema fluid in the right leg to easily heal this wound and he appears to be developing blisters medially to form additional wounds 1/25; his CO2 angiogram was done by Dr. Doren Custard last week. This showed the proximal arteries all to be  patent. On the right side the common femoral deep femoral superficial femoral popliteal anterior tibial and peroneal arteries were patent. The posterior tibial was occluded but reconstituted distally via collaterals from the peroneal artery he was felt he should have enough blood flow to heal his wounds including the right fifth toe. The right fifth toe looks better however he is still complaining of a lot of pain. The large area which is a venous ulceration posteriorly has a better looking surface I think we can switch to Hydrofera Blue today 2/1; the patient's original wounds on the right posterior medial calf has come down in width however superior to this he has new denuded areas and I am concerned we simply do not have enough edema control. He has already undergone a right greater saphenous ablation. He had a CO2 angiogram done by Dr. Doren Custard and the comment was that we should have enough blood flow to heal the venous wound however we simply do not seem to have enough edema control with 3 layer compression. The patient is status post kidney transplant although his exact renal function is not really clear. Nor am i sure what  dose of diuretic he is supposed to be on. He also has the area on the right fifth toe which was unclear etiology but I think became secondarily infected I gave him 2 weeks of antibiotics for this and this seems to have settled down he still has a black eschar over the tip of the toe. X-ray was negative for fracture. He says he has a history of gout. Electronic Signature(s) Signed: 10/22/2019 6:41:12 PM By: [name] MD Entered By: [name] on 10/22/2019 10:54:52 -------------------------------------------------------------------------------- Physical Exam Details Patient Name: Date of Service: [name], [name] 10/22/2019 9:00 AM Medical Record [id number] Patient Account Number: [id number] Date of Birth/Sex: Treating RN: 1945-12-22 (73 y.o. M) Primary  Care Provider: [name] Other Clinician: Referring Provider: Treating Provider/Extender:Griffith Santilli, [name], [name] in Treatment: 11 Constitutional Patient is hypertensive.. Pulse regular and within target range for patient.Marland Kitchen Respirations regular, non-labored and within target range.. Temperature is normal and within the target range for the patient.Marland Kitchen Appears in no distress. Cardiovascular He does have pedal pulses including the posterior tibial and dorsalis pedis pulse on the right. Some improvement in the amount of edema but still a lot at least 3+ pitting edema in the right leg. Similar findings on the left. Musculoskeletal Very tender over the DIP of the right fifth toe. No overt tenderness over the PIP or the MTP on the fifth. Notes Wound exam Right fifth toe still necrotic tip and I remove this this week after we had the vascular reassurance. He tolerates this exceptionally poorly and I am left wondering why this is so painful. Electronic Signature(s) Signed: 10/22/2019 6:41:12 PM By: [name] MD Entered By: [name] on 10/22/2019 10:56:19 -------------------------------------------------------------------------------- Physician Orders Details Patient Name: Date of Service: [name], [name] 10/22/2019 9:00 AM Medical Record [id number] Patient Account Number: [id number] Date of Birth/Sex: Treating RN: 09-25-1945 (73 y.o. [name] Primary Care Provider: [name] Other Clinician: Referring Provider: Treating Provider/Extender:Niya Behler, [name], [name] in Treatment: 11 Verbal / Phone Orders: No Diagnosis Coding ICD-10 Coding Code Description S80.11XD Contusion of right lower leg, subsequent encounter L97.212 Non-pressure chronic ulcer of right calf with fat layer exposed I87.321 Chronic venous hypertension (idiopathic) with inflammation of right lower extremity E11.622 Type 2 diabetes mellitus  with other skin ulcer L97.518 Non-pressure chronic ulcer of other part of right foot with other specified severity Follow-up Appointments Return Appointment in 1 week. Dressing Change Frequency Change dressing three times week. - all wounds - 2x by home health on Wednesdays and Fridays, 1x by wound clinic on Mondays Skin Barriers/Peri-Wound Care Barrier cream Moisturizing lotion Wound Cleansing Clean wound with Normal Saline. - or normal saline on days that dressing is changed May shower with protection. Primary Wound Dressing Wound #3 Right,Posterior Lower Leg Hydrofera Blue - Classic Wound #5 Right Toe Fifth Calcium Alginate with Silver Wound #6 Right,Medial Lower Leg Calcium Alginate with Silver - any weeping/blistered areas Wound #7 Right,Distal,Posterior Lower Leg Calcium Alginate with Silver - any weeping/blistered areas Wound #8 Right,Lateral Lower Leg Calcium Alginate with Silver - any weeping/blistered areas Secondary Dressing Wound #3 Right,Posterior Lower Leg Foam - foam top bend of foot for protection ABD pad Zetuvit or Kerramax - or Xtrasorb Wound #5 Right Toe Fifth Kerlix/Rolled Gauze Dry Gauze Wound #6 Right,Medial Lower Leg ABD pad Zetuvit or Kerramax - or Xtrasorb Wound #7 Right,Distal,Posterior Lower Leg ABD pad Zetuvit or Kerramax - or Xtrasorb Wound #8 Right,Lateral Lower Leg ABD pad Zetuvit or Kerramax - or [employer]  Edema Control Unna Boot to Right Lower Extremity Avoid standing for long periods of time Elevate legs to the level of the heart or above for 30 minutes daily and/or when sitting, a frequency of: - throughout the day Exercise regularly Additional Orders / Instructions Other: - Go to ER if toe/leg gets more red/black, increased pain, fever Other: - Make follow up appt with Kidney doctor regarding uncontrolled swelling [facility] for wound care. - [facility] Electronic Signature(s) Signed: 10/22/2019  6:39:44 PM By: [name] RN, BSN Signed: 10/22/2019 6:41:12 PM By: [name] MD Entered By: [name] on 10/22/2019 10:03:40 -------------------------------------------------------------------------------- Problem List Details Patient Name: Date of Service: [name], [name] 10/22/2019 9:00 AM Medical Record [id number] Patient Account Number: [id number] Date of Birth/Sex: Treating RN: Feb 09, 1946 (73 y.o. Jonette Eva, Briant Cedar Primary Care Provider: [name] Other Clinician: Referring Provider: Treating Provider/Extender:Shontel Santee, [name], [name] in Treatment: 11 Active Problems ICD-10 Evaluated Encounter Code Description Active Date Today Diagnosis S80.11XD Contusion of right lower leg, subsequent encounter 08/06/2019 No Yes L97.212 Non-pressure chronic ulcer of right calf with fat layer 08/06/2019 No Yes exposed I87.321 Chronic venous hypertension (idiopathic) with 08/06/2019 No Yes inflammation of right lower extremity E11.622 Type 2 diabetes mellitus with other skin ulcer 08/06/2019 No Yes L97.518 Non-pressure chronic ulcer of other part of right foot 10/01/2019 No Yes with other specified severity Inactive Problems Resolved Problems Electronic Signature(s) Signed: 10/22/2019 6:41:12 PM By: [name] MD Entered By: [name] on 10/22/2019 10:51:57 -------------------------------------------------------------------------------- Progress Note Details Patient Name: Date of Service: [name], [name] 10/22/2019 9:00 AM Medical Record [id number] Patient Account Number: [id number] Date of Birth/Sex: Treating RN: 07/26/1946 (74 y.o. M) Primary Care Provider: [name] Other Clinician: Referring Provider: Treating Provider/Extender:Manhattan Mccuen, [name], [name] in Treatment: 11 Subjective History of Present Illness (HPI) [place] HPI Description: 75 year old gentleman who was recently seen by  his nephrologist [name], and noted to have a wound on his left lower extremity which was lacerated 2 months ago and now has reopened. The patient's left shin has a ulceration with some exudate but no evidence of infection and he was referred to [place] for further care as it was known that the patient has had some peripheral vascular disease in the past. Past medical history significant for chronic kidney disease, atrial fibrillation, diabetes mellitus,status post kidney transplant in 1983 and 2005, a week fistula graft placement, status post previous bowel surgery. he works as a [name], [name] and is active and on his feet for a long while. 10/06/2015 -- x-ray of the left tibia and fibula shows no evidence of osteomyelitis. The patient has also had Doppler studies of his extremity and is awaiting the appointment with the vascular surgeon. We have not yet received these reports. 10/13/2015 -- lower extremity venous duplex reflux evaluation shows reflux in the left common femoral vein, left saphenofemoral junction and the proximal greater saphenous vein extending to the proximal calf. There is also reflux in the left proximal to mid small saphenous vein. Arterial duplex studies done showed the resting ABI was not applicable due to tibial artery medial calcification. 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 The right TBI was 1 which is normal and the left ABI was 1 which is normal. Patient has otherwise been doing fine and has been compliant with his dressings. 10/20/2015 -- He was  seen by [name] recently for a vascular opinion on 10/15/2015. His left lower extremity venous insufficiency duplex study revealed GSV reflux,SS vein reflux and deep venous reflux in the common femoral vein. His ABIs were non compressible and his TBI on the right was 1.01 and on  the left was 0.80. He was asked to continue with the wound care with compressive therapy followed by EVLA of the left GS vein 3 months. He recommended 20-30 mm thigh-high compression stockings and the need for a three-month trial of this. The patient had an Unna boot applied at the vascular office but he could not tolerate this with a lot of pain and issues with his toes and hence came here on Friday for removal of this and we reapplied a 2 layer compression. 11/10/2015 -- patient still has not purchased his 20-30 mm thigh-high compression stockings as prescribed by [name]. Readmission: 08/08/18 on evaluation today patient presents for readmission concerning a new injury to the left anterior lower extremity. He was previously seen in 2017 here in our clinic. He states that he has done fairly well since that point. Nonetheless he is having at this time some pain but states that he hit this on a table that fell over and actually struck his leg. This appears to have pulled back some of his skin which folded in on itself and is causing some difficulty as far as that is concerned. There does not appear to be any evidence of infection at this time. No fevers, chills, nausea, or vomiting noted at this time. He's been using dressings on his own currently without complication. 08/15/18 on evaluation today patient actually appears to be doing somewhat better in regard to his wound of the lower [name] when compared to the first visit last week. I had to do a much more extensive debridement at that time it does appear that I'm gonna have to perform some debridement today but it does not look to be as extensive by any means. Nonetheless fortunately he does not show any signs of infection he does have discomfort at this site. I believe based on what I'm seeing currently he may benefit from Iodoflex to help keep the wound bed clean. Patient tolerated therapy without complication. Upon evaluation today the  patient actually appears to be doing excellent in regard to his left lower extremity ulcer. This is much better than the previous two visits where he had a lot of necrotic tissue around the edge of the wound simply due to the fact that again there was a significant skin tear where the edge had been cleared away prior to reattaching and being able to heal appropriately. He seems to be doing much better at this point. 08/28/18 on evaluation today the patient's wound actually does appear to be showing signs of improvement. With that being said though he is improving he would likely note even greater improvement if we were able to sharply debride the wound. Nonetheless this caused him to much discomfort he tells me. 09/04/18 on evaluation today patient actually appears to be showing signs of improvement in regard to his left lower extremity ulcer. He has been tolerating the dressing changes including the wrap although he tells me at this point that the burning does last for a couple of days even with just the Iodoflex. I was afraid that this may been part of the issue that he was having with discomfort. It does seem to be the case. Nonetheless he shows no signs  of evidence of infection at this time which is good news. No fevers chills noted ADMISSION to [name] 10/05/2018 This is a patient who was cared for in 2017 and again in the fall of this year at our sister clinic and [facility]. He actually lives in [place] in [place]. We have been dealing with an apparent traumatic area on the left anterior tibial area. This is been present for the last several months. He was supposed to be using Iodoflex Kerlix and Coban however he was hospitalized from 09/05/2018 through 09/11/2018 with delirium secondary to pneumonia. Since then he is only been putting Vaseline gauze on this without compression. He also has a more recent skin tear on the dorsal right hand that may have only happened in  the last week. The patient had arterial studies done in 2017 in January which was 3 years ago. At that point he had noncompressible ABIs but really quite good TBI's both normal. Triphasic waveforms on the right monophasic at the left posterior tibial but triphasic at the left dorsalis pedis. His ABIs in our clinic today were both noncompressible 1/23; the patient has wounds on the right dorsal hand just distal to the wrist and on the left anterior lower extremity. Both of these look very healthy he is using Hydrofera Blue 1/30; left anterior lower extremity wound much smaller. Healthy looking surface. The laceration area just distal to the wrist on the dorsal hand on the right is also just about closed I used Hydrofera Blue here 2/6; left anterior lower extremity wound is much smaller but still open. The laceration area just distal to the wrist on the dorsal hand is fully epithelialized. 2/13; the patient's anterior lower extremity wound is closed. The laceration just distal to the wrist on the dorsal hand is also fully epithelialized and closed. The patient has external compression stockings which I think are 20/30 READMISSION 08/06/2019 [name] is a 74 year old [name] with had several times previously in our clinic. He is a diabetic with a history of chronic renal insufficiency status post kidney transplant in 1983 and again in 2005. He was then in 2017 with a laceration on the left lower extremity. He was worked up at the time with arterial studies and reflux studies. The arterial studies showed ABIs to be noncompressible but TBI's were within normal limits. I do not have the reflux studies at the moment. He was also sent here in 2019 with a left lower extremity wound and then again in 2020 with left lower extremity trauma a skin tear on the wrist. He was discharged with 20/30 stockings identified from myself that that might not be enough compression. Nevertheless he states he was wearing  these fairly reliably. In September he had a fall with a substantial bruise in the area of the wound. He says he saw orthopedics and they told him there was some muscle strain sometime it later this opened into a wound. He has a fairly substantial wound on the right posterior calf. Satellite areas around this including medially and posteriorly. He has not worn his stockings since the injury Past medical history; includes chronic renal failure secondary to diabetes with kidney transplant x2, atrial fibrillation, heart failure with preserved ejection fraction, coronary artery disease. ABIs on the right in our clinic were once again noncompressible 08/13/2019 on evaluation today patient appears to be doing decently well with regard to his wound compared to last week's evaluation. Unfortunately he is still having a lot of discomfort  at this point which is I think in some part due to the 3 layer compression wrap which is a little bit stronger I think for him. When he was here before we actually utilized a Kerlix and Coban wrap which he states seemed to got a little bit better. Nonetheless I think we can probably drop back to this in light of the discomfort that he had. Nonetheless the pain was not really right around the wound itself as much as it was around the ankle in particular. The Iodoflex does seem to have done well for him As the wound is appearing somewhat better today which is excellent news. 11/30; still complaining of a lot of pain. Apparently arterial studies I ordered 2 weeks ago are below. I do not believe we have an appointment with vein and vascular as of yet; ABI Findings: +---------+------------------+-----+----------+--------+ Right Rt Pressure (mmHg)IndexWaveform Comment  +---------+------------------+-----+----------+--------+ PTA >254 1.50 monophasic  +---------+------------------+-----+----------+--------+ DP >254 1.50 monophasic   +---------+------------------+-----+----------+--------+ Great Toe68 0.40 Abnormal   +---------+------------------+-----+----------+--------+ +---------+------------------+-----+----------+-------+ Left Lt Pressure (mmHg)IndexWaveform Comment +---------+------------------+-----+----------+-------+ Brachial 169     +---------+------------------+-----+----------+-------+ PTA >254 1.50 monophasic  +---------+------------------+-----+----------+-------+ DP >254 1.50 monophasic  +---------+------------------+-----+----------+-------+ Great Toe40 0.24 Abnormal   +---------+------------------+-----+----------+-------+ Pedal arteries appear hyperemic. Patient refused Brachial pressure in the right arm. Summary: Right: Resting right ankle-brachial index indicates noncompressible right lower extremity arteries. The right toe- brachial index is abnormal. Left: Resting left ankle-brachial index indicates noncompressible left lower extremity arteries. The left toe-brachial index is abnormal. He is also having considerably more swelling in his left calf. This was not there when I last saw him 2 weeks ago. He tells me that some of the home health compression wraps have been slipping down and that may be the issue here however a month I am uncertain 44/0; sees vascular on December 22. Still complaining of a lot of pain. DVT study I did last time was negative for DVT 12/14; still complaining of pain which if this is arterial is certainly claudication and rest keeps him uncomfortable at night. He has an appointment with vein and vascular on December 22. Wound surface is better using Iodoflex. Once the surface of this is satisfactory and we have exhausted the vascular route. Perhaps an advanced treatment option. He has a configuration of the venous ulceration although his arterial studies are not very good. The other issue is the patient has a transplanted kidney.  This will make angiography difficult and challenging issue 12/21; still complaining of pain and drainage. We are using Iodoflex on the wound under compression. He sees Dr. Donnetta Hutching tomorrow to evaluate his noninvasive studies noted above. He has a transplanted kidney further complicating the options for angiography. 12/28; still using Iodoflex under compression. I have Dr. Luther Parody note from 12/22. he noted arterial studies revealing monophasic waveforms at the pedal vessels bilaterally and calcified vessels making the ABI unreliable. He did not comment on the reduced TBI's. He felt these were venous wounds based on the palpable dorsalis pedis pulse. 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 And they arranged for formal venous duplex with reflux studies in the next several weeks. Follow-up with either Scot Dock or Dr. Oneida Alar 09/24/2019 still using Iodoflex under compression. He has an appointment with Dr. Doren Custard on 1/7 with regards to his venous disease. The patient was not felt to have a primary arterial problem for the nonhealing of his wound. We did attempt to wrap him with 3 layer when he first came into the clinic he complained of a  lot of pain in the ankle although this may have been because the dressing fell down somewhat. He has far too edema fluid in the right leg for a good prognosis about healing this wound He comes in today with an excoriation on the bottom part of his right fifth toe. He thinks he may have done this taking off his clothes and that something got caught on the toe. There is no open wound however the toe itself is very painful 1/11; we are using Iodoflex under compression. The wound bed is clean. He went on to see Dr. Doren Custard on 09/27/2019 he again feels that the patient's arterial supply is adequate. He feels that he might benefit from right greater saphenous vein ablation for a venous ulcer. In the meantime the area that he was complaining about last week on the right fifth  toe. An x-ray that I ordered showed marked soft tissue swelling along the distal aspect of the right foot but there was no evidence of osteomyelitis. He comes in today with the fifth toenail just about coming off. He has black eschar underneath the toe on the plantar aspect. The toe was swollen red and very painful. In the right setting this could be a significant soft tissue infection versus ischemia of the toe itself. He did not show this to Dr. Doren Custard 1/18; I am using Iodoflex under compression a large wound on the right posterior calf. He had his right greater saphenous vein ablation by Dr. Doren Custard although I am not sure that is the only problem here. ooThe right fifth toe which was possibly trauma 2 weeks ago continues to be exceptionally painful with a necrotic tip. Maybe not quite as swollen. I started him empirically on Augmentin 5/125 1 p.o. twice daily last week after discussing this with Dr. Loletha Grayer of nephrology. Perhaps somewhat better this week but not as good as I was hoping. A plain x-ray was negative. He comes in today with an area on the medial right calf that was blistered and now open. In looking at things he appears to be systemically fluid volume overloaded. He has a transplanted kidney. He states he takes his Lasix variably when he has appointments he tries not to take it the later I am really not certain if he takes this reliably. However he has far too much edema fluid in the right leg to easily heal this wound and he appears to be developing blisters medially to form additional wounds 1/25; his CO2 angiogram was done by Dr. Doren Custard last week. This showed the proximal arteries all to be patent. On the right side the common femoral deep femoral superficial femoral popliteal anterior tibial and peroneal arteries were patent. The posterior tibial was occluded but reconstituted distally via collaterals from the peroneal artery he was felt he should have enough blood flow to heal his  wounds including the right fifth toe. The right fifth toe looks better however he is still complaining of a lot of pain. The large area which is a venous ulceration posteriorly has a better looking surface I think we can switch to Hydrofera Blue today 2/1; the patient's original wounds on the right posterior medial calf has come down in width however superior to this he has new denuded areas and I am concerned we simply do not have enough edema control. He has already undergone a right greater saphenous ablation. He had a CO2 angiogram done by Dr. Doren Custard and the comment was that we should have enough blood flow  to heal the venous wound however we simply do not seem to have enough edema control with 3 layer compression. The patient is status post kidney transplant although his exact renal function is not really clear. Nor am i sure what dose of diuretic he is supposed to be on. He also has the area on the right fifth toe which was unclear etiology but I think became secondarily infected I gave him 2 weeks of antibiotics for this and this seems to have settled down he still has a black eschar over the tip of the toe. X-ray was negative for fracture. He says he has a history of gout. Objective Constitutional Patient is hypertensive.. Pulse regular and within target range for patient.Marland Kitchen Respirations regular, non-labored and within target range.. Temperature is normal and within the target range for the patient.Marland Kitchen Appears in no distress. Vitals Time Taken: 9:10 AM, Height: 67 in, Weight: 190 lbs, BMI: 29.8, Temperature: 97.8 F, Pulse: 53 bpm, Respiratory Rate: 19 breaths/min, Blood Pressure: 165/81 mmHg, Capillary Blood Glucose: 119 mg/dl. General Notes: patient stated CBG this morning was 119 Cardiovascular He does have pedal pulses including the posterior tibial and dorsalis pedis pulse on the right. Some improvement in the amount of edema but still a lot at least 3+ pitting edema in the right leg.  Similar findings on the left. Musculoskeletal Very tender over the DIP of the right fifth toe. No overt tenderness over the PIP or the MTP on the fifth. General Notes: Wound exam ooRight fifth toe still necrotic tip and I remove this this week after we had the vascular reassurance. He tolerates this exceptionally poorly and I am left wondering why this is so painful. Integumentary (Hair, Skin) Wound #3 status is Open. Original cause of wound was Trauma. The wound is located on the Right,Posterior Lower Leg. The wound measures 16cm length x 5.8cm width x 0.5cm depth; 72.885cm^2 area and 36.442cm^3 volume. There is Fat Layer (Subcutaneous Tissue) Exposed exposed. There is no tunneling or undermining noted. There is a large amount of purulent drainage noted. The wound margin is well defined and not attached to the wound base. There is medium (34-66%) pink granulation within the wound bed. There is a medium (34-66%) amount of necrotic tissue within the wound bed including Adherent Slough. Wound #5 status is Open. Original cause of wound was Blister. The wound is located on the Right Toe Fifth. The wound measures 1.2cm length x 1.2cm width x 0.1cm depth; 1.131cm^2 area and 0.113cm^3 volume. There is no tunneling or undermining noted. There is a small amount of serous drainage noted. The wound margin is distinct with the outline attached to the wound base. There is no granulation within the wound bed. There is a large (67- 100%) amount of necrotic tissue within the wound bed including Eschar. Wound #6 status is Open. Original cause of wound was Blister. The wound is located on the Right,Medial Lower Leg. The wound measures 10.5cm length x 5.5cm width x 0.1cm depth; 45.357cm^2 area and 4.536cm^3 volume. There is Fat Layer (Subcutaneous Tissue) Exposed exposed. There is no tunneling or undermining noted. There is a medium amount of serous drainage noted. The wound margin is distinct with the outline  attached to the wound base. There is large (67-100%) pink granulation within the wound bed. There is no necrotic tissue within the wound bed. Wound #7 status is Open. Original cause of wound was Blister. The wound is located on the Right,Distal,Posterior Lower Leg. The wound measures 1.5cm length x  5cm width x 0.1cm depth; 5.89cm^2 area and 0.589cm^3 volume. There is Fat Layer (Subcutaneous Tissue) Exposed exposed. There is no tunneling or undermining noted. There is a medium amount of serous drainage noted. The wound margin is distinct with the outline attached to the wound base. There is large (67-100%) pink granulation within the wound bed. There is no necrotic tissue within the wound bed. Wound #8 status is Open. Original cause of wound was Blister. The wound is located on the Right,Lateral Lower Leg. The wound measures 2.7cm length x 4.3cm width x 0.1cm depth; 9.118cm^2 area and 0.912cm^3 volume. There is Fat Layer (Subcutaneous Tissue) Exposed exposed. There is no tunneling or undermining noted. There is a small amount of serous drainage noted. The wound margin is distinct with the outline attached to the wound base. There is large (67-100%) pink granulation within the wound bed. There is no necrotic tissue within the wound bed. 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 There was a Excisional Skin/Subcutaneous Tissue Debridement with a total area of 1.44 sq cm performed by  Ricard Dillon., MD. With the following instrument(s): Curette to remove Viable and Non-Viable tissue/material. Material removed includes Eschar and Subcutaneous Tissue and after achieving pain control using Other (Benzocaine 20%). No specimens were taken. A time out was conducted at 09:53, prior to the start of the procedure. A Minimum amount of bleeding was controlled with Pressure. The procedure was tolerated well with a pain level of 5 throughout and a pain level of 3 following the procedure. Post Debridement Measurements: 1.2cm length x 1.2cm width x 0.1cm depth; 0.113cm^3 volume. Character of Wound/Ulcer Post Debridement is improved. Severity of Tissue Post Debridement is: Fat layer exposed. Post procedure Diagnosis Wound #5: Same as Pre-Procedure Wound #3 Pre-procedure diagnosis of Wound #3 is a Venous Leg Ulcer located on the Right,Posterior Lower Leg . There was a Haematologist Compression Therapy Procedure by [name], RN. Post procedure Diagnosis Wound #3: Same as Pre-Procedure Wound #6 Pre-procedure diagnosis of Wound #6 is a Venous Leg Ulcer located on the Right,Medial Lower Leg . There was a Haematologist Compression Therapy Procedure by [name], RN. Post procedure Diagnosis Wound #6: Same as Pre-Procedure Wound #8 Pre-procedure diagnosis of Wound #8 is a Venous Leg Ulcer located on the Right,Lateral Lower Leg . There was a Haematologist Compression Therapy Procedure by [name], RN. Post procedure Diagnosis Wound #8: Same as Pre-Procedure Plan Follow-up Appointments: Return Appointment in 1 week. Dressing Change Frequency: Change dressing three times week. - all wounds - 2x by home health on Wednesdays and Fridays, 1x by wound clinic on Mondays Skin Barriers/Peri-Wound Care: Barrier cream Moisturizing lotion Wound Cleansing: Clean wound with Normal Saline. - or normal saline on days that dressing is changed May shower with protection. Primary Wound  Dressing: Wound #3 Right,Posterior Lower Leg: Hydrofera Blue - Classic Wound #5 Right Toe Fifth: Calcium Alginate with Silver Wound #6 Right,Medial Lower Leg: Calcium Alginate with Silver - any weeping/blistered areas Wound #7 Right,Distal,Posterior Lower Leg: Calcium Alginate with Silver - any  weeping/blistered areas Wound #8 Right,Lateral Lower Leg: Calcium Alginate with Silver - any weeping/blistered areas Secondary Dressing: Wound #3 Right,Posterior Lower Leg: Foam - foam top bend of foot for protection ABD pad Zetuvit or Kerramax - or Xtrasorb Wound #5 Right Toe Fifth: Kerlix/Rolled Gauze Dry Gauze Wound #6 Right,Medial Lower Leg: ABD pad Zetuvit or Kerramax - or Xtrasorb Wound #7 Right,Distal,Posterior Lower Leg: ABD pad Zetuvit or Kerramax - or Xtrasorb Wound #8 Right,Lateral Lower Leg: ABD pad Zetuvit or Kerramax - or Xtrasorb Edema Control: Unna Boot to Right Lower Extremity Avoid standing for long periods of time Elevate legs to the level of the heart or above for 30 minutes daily and/or when sitting, a frequency of: - throughout the day Exercise regularly Additional Orders / Instructions: Other: - Go to ER if toe/leg gets more red/black, increased pain, fever Other: - Make follow up appt with Kidney doctor regarding uncontrolled swelling Home Health: [facility] for wound care. - [facility] 1. I am concerned about the superficial breakdown superiorly to the large wound on the right calf. I think this adds a sense of urgency to the amount of compression we put on this leg and I have moved towards Unna boots. 2. Hydrofera Blue will still be the treatment of choice to the major wound. We are going to use silver alginate to the denuded areas and still to the right fifth toe 3. The patient has chronic venous insufficiency and is undergone great saphenous vein ablation on the right by vein and vascular. 4. He does have PAD however was not felt  by vein and vascular that this should be an impediment to heal the wounds even on the right fifth toe. 5. I have moved towards Unna boots as they will give [place] some additional compression without uncontrolled compression that might result in worsening tissue ischemia. However I have told him if there is pain that he cannot tolerate he should take them off and call us. Electronic Signature(s) Signed: 10/22/2019 6:41:12 PM By: [name] MD Entered By: [name] on 10/22/2019 10:58:26 -------------------------------------------------------------------------------- SuperBill Details Patient Name: Date of Service: GWEN, EDLER 10/22/2019 Medical Record [id number] Patient Account Number: [id number] Date of Birth/Sex: Treating RN: 1945/12/02 (73 y.o. 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 Facility Procedures CPT4 Code Description: [id number] 11042 - DEB SUBQ TISSUE 20 SQ CM/< ICD-10 Diagnosis Description L97.518 Non-pressure chronic ulcer of other part of right foot with o Modifier: ther specified Quantity: 1 severity CPT4 Code Description: [id number] (Facility Use Only) 32951OA - APPLY UNNA BOOT RT Modifier: 25 Quantity: 1 Physician Procedures CPT4 Code Description: [id number] 11042 - WC PHYS SUBQ TISS 20 SQ CM ICD-10 Diagnosis Description L97.518 Non-pressure chronic ulcer of other part of right foot with Modifier: other  specifie Quantity: 1 d severity Electronic Signature(s) Signed: 10/22/2019 6:39:44 PM By: [name] RN, BSN Signed: 10/22/2019 6:41:12 PM By: [name] MD Entered By: [name] on 10/22/2019 18:13:07

## 2019-10-25 ENCOUNTER — Other Ambulatory Visit: Payer: Self-pay

## 2019-10-25 ENCOUNTER — Ambulatory Visit (HOSPITAL_COMMUNITY)
Admission: RE | Admit: 2019-10-25 | Discharge: 2019-10-25 | Disposition: A | Discharge status: Home / Self Care | Payer: Medicare Other | Source: Ambulatory Visit | Attending: Nephrology | Admitting: Nephrology

## 2019-10-25 DIAGNOSIS — D631 Anemia in chronic kidney disease: Secondary | ICD-10-CM | POA: Diagnosis present

## 2019-10-25 DIAGNOSIS — N185 Chronic kidney disease, stage 5: Secondary | ICD-10-CM | POA: Diagnosis not present

## 2019-10-25 LAB — HEMOGLOBIN AND HEMATOCRIT, BLOOD
HCT: 29.3 % — ABNORMAL LOW (ref 39.0–52.0)
Hemoglobin: 8.4 g/dL — ABNORMAL LOW (ref 13.0–17.0)

## 2019-10-25 MED ORDER — DARBEPOETIN ALFA 150 MCG/0.3ML IJ SOSY
120.0000 ug | PREFILLED_SYRINGE | INTRAMUSCULAR | Status: DC
  Administered 2019-10-25: 120 ug via SUBCUTANEOUS
  Filled 2019-10-25: qty 0.3

## 2019-10-25 NOTE — Discharge Instructions (Signed)
Darbepoetin Alfa injection What is this medicine? DARBEPOETIN ALFA (dar be POE e tin AL fa) helps your body make more red blood cells. It is used to treat anemia caused by chronic kidney failure and chemotherapy. This medicine may be used for other purposes; ask your health care provider or pharmacist if you have questions. COMMON BRAND NAME(S): Aranesp What should I tell my health care provider before I take this medicine? They need to know if you have any of these conditions:  blood clotting disorders or history of blood clots  cancer patient not on chemotherapy  cystic fibrosis  heart disease, such as angina, heart failure, or a history of a heart attack  hemoglobin level of 12 g/dL or greater  high blood pressure  low levels of folate, iron, or vitamin B12  seizures  an unusual or allergic reaction to darbepoetin, erythropoietin, albumin, hamster proteins, latex, other medicines, foods, dyes, or preservatives  pregnant or trying to get pregnant  breast-feeding How should I use this medicine? This medicine is for injection into a vein or under the skin. It is usually given by a health care professional in a hospital or clinic setting. If you get this medicine at home, you will be taught how to prepare and give this medicine. Use exactly as directed. Take your medicine at regular intervals. Do not take your medicine more often than directed. It is important that you put your used needles and syringes in a special sharps container. Do not put them in a trash can. If you do not have a sharps container, call your pharmacist or healthcare provider to get one. A special MedGuide will be given to you by the pharmacist with each prescription and refill. Be sure to read this information carefully each time. Talk to your pediatrician regarding the use of this medicine in children. While this medicine may be used in children as young as 1 month of age for selected conditions, precautions do  apply. Overdosage: If you think you have taken too much of this medicine contact a poison control center or emergency room at once. NOTE: This medicine is only for you. Do not share this medicine with others. What if I miss a dose? If you miss a dose, take it as soon as you can. If it is almost time for your next dose, take only that dose. Do not take double or extra doses. What may interact with this medicine? Do not take this medicine with any of the following medications:  epoetin alfa This list may not describe all possible interactions. Give your health care provider a list of all the medicines, herbs, non-prescription drugs, or dietary supplements you use. Also tell them if you smoke, drink alcohol, or use illegal drugs. Some items may interact with your medicine. What should I watch for while using this medicine? Your condition will be monitored carefully while you are receiving this medicine. You may need blood work done while you are taking this medicine. This medicine may cause a decrease in vitamin B6. You should make sure that you get enough vitamin B6 while you are taking this medicine. Discuss the foods you eat and the vitamins you take with your health care professional. What side effects may I notice from receiving this medicine? Side effects that you should report to your doctor or health care professional as soon as possible:  allergic reactions like skin rash, itching or hives, swelling of the face, lips, or tongue  breathing problems  changes in   vision  chest pain  confusion, trouble speaking or understanding  feeling faint or lightheaded, falls  high blood pressure  muscle aches or pains  pain, swelling, warmth in the leg  rapid weight gain  severe headaches  sudden numbness or weakness of the face, arm or leg  trouble walking, dizziness, loss of balance or coordination  seizures (convulsions)  swelling of the ankles, feet, hands  unusually weak or  tired Side effects that usually do not require medical attention (report to your doctor or health care professional if they continue or are bothersome):  diarrhea  fever, chills (flu-like symptoms)  headaches  nausea, vomiting  redness, stinging, or swelling at site where injected This list may not describe all possible side effects. Call your doctor for medical advice about side effects. You may report side effects to FDA at 1-800-FDA-1088. Where should I keep my medicine? Keep out of the reach of children. Store in a refrigerator between 2 and 8 degrees C (36 and 46 degrees F). Do not freeze. Do not shake. Throw away any unused portion if using a single-dose vial. Throw away any unused medicine after the expiration date. NOTE: This sheet is a summary. It may not cover all possible information. If you have questions about this medicine, talk to your doctor, pharmacist, or health care provider.  2020 Elsevier/Gold Standard (2017-09-21 16:44:20)  

## 2019-10-25 NOTE — Progress Notes (Signed)
PATIENT CARE CENTER NOTE  Diagnosis:Anemia associated with Chronic Renal Failure   Provider:Coladonato, Broadus John, MD   Procedure:Aranesp injections   Note:Patient received sub-q Aranesp injectionin left arm. Tolerated injection well. No adverse reaction noted. Pre-injection, Hemoglobin8.4and BP 164/79. Discharge paperwork given to patient. Alert, oriented and ambulatory at discharge

## 2019-10-29 ENCOUNTER — Other Ambulatory Visit: Payer: Self-pay

## 2019-10-29 ENCOUNTER — Encounter (HOSPITAL_BASED_OUTPATIENT_CLINIC_OR_DEPARTMENT_OTHER): Payer: Medicare Other | Attending: Internal Medicine | Admitting: Internal Medicine

## 2019-10-29 DIAGNOSIS — S8011XD Contusion of right lower leg, subsequent encounter: Secondary | ICD-10-CM | POA: Diagnosis not present

## 2019-10-30 NOTE — Progress Notes (Signed)
RIAN, BUSCHE ([id number]) Visit Report for 10/29/2019 HPI Details Patient Name: Date of Service: [name], [name] 10/29/2019 9:00 AM Medical Record [id number] Patient Account Number: [id number] Date of Birth/Sex: Treating RN: 20-Nov-1945 (74 y.o. M) Primary Care Provider: [name] Other Clinician: Referring Provider: Treating Provider/Extender:Annalycia Done, [name], [name] in Treatment: 12 History of Present Illness HPI Description: [name] HPI Description: 74 year old gentleman who was recently seen by his nephrologist [name], and noted to have a wound on his left lower extremity which was lacerated 2 months ago and now has reopened. The patient's left shin has a ulceration with some exudate but no evidence of infection and he was referred to [place] for further care as it was known that the patient has had some peripheral vascular disease in the past. Past medical history significant for chronic kidney disease, atrial fibrillation, diabetes mellitus,status post kidney transplant in 1983 and 2005, a week fistula graft placement, status post previous bowel surgery. 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 10/06/2015 -- x-ray of the left tibia and fibula shows no evidence of osteomyelitis. The patient has also had Doppler studies of his extremity and is awaiting the appointment with the vascular surgeon. We have not yet received these reports. 10/13/2015 -- lower extremity venous duplex reflux evaluation shows reflux in the left common femoral vein, left saphenofemoral junction and the proximal greater saphenous vein extending to the proximal calf. There is also reflux in the left proximal to mid small saphenous vein. Arterial duplex studies done showed the resting ABI was not applicable due to tibial artery medial calcification. The left ABI was 0.8 using the Doppler dorsalis pedis indicating mild arterial  occlusive disease at rest with the posterior tibial artery noted to be noncompressible. The right TBI was 1 which is normal and the left ABI was 1 which is normal. Patient has otherwise been doing fine and has been compliant with his dressings. 10/20/2015 -- He was seen by [name] recently for a vascular opinion on 10/15/2015. His left lower extremity venous insufficiency duplex study revealed GSV reflux,SS vein reflux and deep venous reflux in the common femoral vein. His ABIs were non compressible and his TBI on the right was 1.01 and on the left was 0.80. He was asked to continue with the wound care with compressive therapy followed by EVLA of the left GS vein 3 months. He recommended 20-30 mm thigh-high compression stockings and the need for a three-month trial of this. The patient had an Unna boot applied at the vascular office but he could not tolerate this with a lot of pain and issues with his toes and hence came here on Friday for removal of this and we reapplied a 2 layer compression. 11/10/2015 -- patient still has not purchased his 20-30 mm thigh-high compression stockings as prescribed by [name]. Readmission: 08/08/18 on evaluation today patient presents for readmission concerning a new injury to the left anterior lower extremity. He was previously seen in 2017 here in our clinic. He states that he has done fairly well since that point. Nonetheless he is having at this time some pain but states that he hit this on a table that fell over and actually struck his leg. This appears to have pulled back some of his skin which folded in on itself and is causing some difficulty as far as that is concerned. There does not appear to be any evidence of infection at  this time. No fevers, chills, nausea, or vomiting noted at this time. He's been using dressings on his own currently without complication. 08/15/18 on evaluation today patient actually appears to be doing somewhat better in  regard to his wound of the lower [name] when compared to the first visit last week. I had to do a much more extensive debridement at that time it does appear that I'm gonna have to perform some debridement today but it does not look to be as extensive by any means. Nonetheless fortunately he does not show any signs of infection he does have discomfort at this site. I believe based on what I'm seeing currently he may benefit from Iodoflex to help keep the wound bed clean. Patient tolerated therapy without complication. Upon evaluation today the patient actually appears to be doing excellent in regard to his left lower extremity ulcer. This is much better than the previous two visits where he had a lot of necrotic tissue around the edge of the wound simply due to the fact that again there was a significant skin tear where the edge had been cleared away prior to reattaching and being able to heal appropriately. He seems to be doing much better at this point. 08/28/18 on evaluation today the patient's wound actually does appear to be showing signs of improvement. With that being said though he is improving he would likely note even greater improvement if we were able to sharply debride the wound. Nonetheless this caused him to much discomfort he tells me. 09/04/18 on evaluation today patient actually appears to be showing signs of improvement in regard to his left lower extremity ulcer. He has been tolerating the dressing changes including the wrap although he tells me at this point that the burning does last for a couple of days even with just the Iodoflex. I was afraid that this may been part of the issue that he was having with discomfort. It does seem to be the case. Nonetheless he shows no signs of evidence of infection at this time which is good news. No fevers chills noted ADMISSION to [name] 10/05/2018 This is a patient who was cared for in 2017 and again in the fall of  this year at our sister clinic and [facility]. He actually lives in [place] in [place]. We have been dealing with an apparent traumatic area on the left anterior tibial area. This is been present for the last several months. He was supposed to be using Iodoflex Kerlix and Coban however he was hospitalized from 09/05/2018 through 09/11/2018 with delirium secondary to pneumonia. Since then he is only been putting Vaseline gauze on this without compression. He also has a more recent skin tear on the dorsal right hand that may have only happened in the last week. The patient had arterial studies done in 2017 in January which was 3 years ago. At that point he had noncompressible ABIs but really quite good TBI's both normal. Triphasic waveforms on the right monophasic at the left posterior tibial but triphasic at the left dorsalis pedis. His ABIs in our clinic today were both noncompressible 1/23; the patient has wounds on the right dorsal hand just distal to the wrist and on the left anterior lower extremity. Both of these look very healthy he is using Hydrofera Blue 1/30; left anterior lower extremity wound much smaller. Healthy looking surface. The laceration area just distal to the wrist on the dorsal hand on the right is also just about closed  I used Hydrofera Blue here 2/6; left anterior lower extremity wound is much smaller but still open. The laceration area just distal to the wrist on the dorsal hand is fully epithelialized. 2/13; the patient's anterior lower extremity wound is closed. The laceration just distal to the wrist on the dorsal hand is also fully epithelialized and closed. The patient has external compression stockings which I think are 20/30 READMISSION 08/06/2019 [name] is a 74 year old man with had several times previously in our clinic. He is a diabetic with a history of chronic renal insufficiency status post kidney transplant in 1983 and again in 2005. He was then in  2017 with a laceration on the left lower extremity. He was worked up at the time with arterial studies and reflux studies. The arterial studies showed ABIs to be noncompressible but TBI's were within normal limits. I do not have the reflux studies at the moment. He was also sent here in 2019 with a left lower extremity wound and then again in 2020 with left lower extremity trauma a skin tear on the wrist. He was discharged with 20/30 stockings identified from myself that that might not be enough compression. Nevertheless he states he was wearing these fairly reliably. In September he had a fall with a substantial bruise in the area of the wound. He says he saw orthopedics and they told him there was some muscle strain sometime it later this opened into a wound. He has a fairly substantial wound on the right posterior calf. Satellite areas around this including medially and posteriorly. He has not worn his stockings since the injury Past medical history; includes chronic renal failure secondary to diabetes with kidney transplant x2, atrial fibrillation, heart failure with preserved ejection fraction, coronary artery disease. ABIs on the right in our clinic were once again noncompressible 08/13/2019 on evaluation today patient appears to be doing decently well with regard to his wound compared to last week's evaluation. Unfortunately he is still having a lot of discomfort at this point which is I think in some part due to the 3 layer compression wrap which is a little bit stronger I think for him. When he was here before we actually utilized a Kerlix and Coban wrap which he states seemed to got a little bit better. Nonetheless I think we can probably drop back to this in light of the discomfort that he had. Nonetheless the pain was not really right around the wound itself as much as it was around the ankle in particular. The Iodoflex does seem to have done well for him As the wound is appearing  somewhat better today which is excellent news. 11/30; still complaining of a lot of pain. Apparently arterial studies I ordered 2 weeks ago are below. I do not believe we have an appointment with vein and vascular as of yet; ABI Findings: +---------+------------------+-----+----------+--------+ Right Rt Pressure (mmHg)IndexWaveform Comment  +---------+------------------+-----+----------+--------+ PTA >254 1.50 monophasic  +---------+------------------+-----+----------+--------+ DP >254 1.50 monophasic  +---------+------------------+-----+----------+--------+ Great Toe68 0.40 Abnormal   +---------+------------------+-----+----------+--------+ +---------+------------------+-----+----------+-------+ Left Lt Pressure (mmHg)IndexWaveform Comment +---------+------------------+-----+----------+-------+ Brachial 169     +---------+------------------+-----+----------+-------+ PTA >254 1.50 monophasic  +---------+------------------+-----+----------+-------+ DP >254 1.50 monophasic  +---------+------------------+-----+----------+-------+ Great Toe40 0.24 Abnormal   +---------+------------------+-----+----------+-------+ Pedal arteries appear hyperemic. Patient refused Brachial pressure in the right arm. Summary: Right: Resting right ankle-brachial index indicates noncompressible right lower extremity arteries. The right toe- brachial index is abnormal. Left: Resting left ankle-brachial index indicates noncompressible left lower extremity arteries. The left toe-brachial index is abnormal. He is  also having considerably more swelling in his left calf. This was not there when I last saw him 2 weeks ago. He tells me that some of the home health compression wraps have been slipping down and that may be the issue here however a month I am uncertain 17/5; sees vascular on December 22. Still complaining of a lot of pain. DVT study I did last time  was negative for DVT 12/14; still complaining of pain which if this is arterial is certainly claudication and rest keeps him uncomfortable at night. He has an appointment with vein and vascular on December 22. Wound surface is better using Iodoflex. Once the surface of this is satisfactory and we have exhausted the vascular route. Perhaps an advanced treatment option. He has a configuration of the venous ulceration although his arterial studies are not very good. The other issue is the patient has a transplanted kidney. This will make angiography difficult and challenging issue 12/21; still complaining of pain and drainage. We are using Iodoflex on the wound under compression. He sees Dr. Donnetta Hutching tomorrow to evaluate his noninvasive studies noted above. He has a transplanted kidney further complicating the options for angiography. 12/28; still using Iodoflex under compression. I have Dr. Luther Parody note from 12/22. he noted arterial studies revealing monophasic waveforms at the pedal vessels bilaterally and calcified vessels making the ABI unreliable. He did not comment on the reduced TBI's. He felt these were venous wounds based on the palpable dorsalis pedis pulse. 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 And they arranged for formal venous duplex with reflux studies in the next several weeks. Follow-up with either Scot Dock or Dr. Oneida Alar 09/24/2019 still using Iodoflex under compression. He has an appointment with Dr. Doren Custard on 1/7 with regards to his venous disease. The patient was not felt to have a primary arterial problem for the nonhealing of his wound. We did attempt to wrap him with 3 layer when he first came into the clinic he complained of a lot of pain in the ankle although this may have been because the dressing fell down somewhat. He has far too edema fluid in the right leg for a good prognosis about healing this wound He comes in today with an excoriation on the bottom part of his  right fifth toe. He thinks he may have done this taking off his clothes and that something got caught on the toe. There is no open wound however the toe itself is very painful 1/11; we are using Iodoflex under compression. The wound bed is clean. He went on to see Dr. Doren Custard on 09/27/2019 he again feels that the patient's arterial supply is adequate. He feels that he might benefit from right greater saphenous vein ablation for a venous ulcer. In the meantime the area that he was complaining about last week on the right fifth toe. An x-ray that I ordered showed marked soft tissue swelling along the distal aspect of the right foot but there was no evidence of osteomyelitis. He comes in today with the fifth toenail just about coming off. He has black eschar underneath the toe on the plantar aspect. The toe was swollen red and very painful. In the right setting this could be a significant soft tissue infection versus ischemia of the toe itself. He did not show this to Dr. Doren Custard 1/18; I am using Iodoflex under compression a large wound on the right posterior calf. He had his right greater saphenous vein ablation by Dr. Doren Custard although I  am not sure that is the only problem here. The right fifth toe which was possibly trauma 2 weeks ago continues to be exceptionally painful with a necrotic tip. Maybe not quite as swollen. I started him empirically on Augmentin 5/125 1 p.o. twice daily last week after discussing this with Dr. Loletha Grayer of nephrology. Perhaps somewhat better this week but not as good as I was hoping. A plain x-ray was negative. He comes in today with an area on the medial right calf that was blistered and now open. In looking at things he appears to be systemically fluid volume overloaded. He has a transplanted kidney. He states he takes his Lasix variably when he has appointments he tries not to take it the later I am really not certain if he takes this reliably. However he has far too much edema  fluid in the right leg to easily heal this wound and he appears to be developing blisters medially to form additional wounds 1/25; his CO2 angiogram was done by Dr. Doren Custard last week. This showed the proximal arteries all to be patent. On the right side the common femoral deep femoral superficial femoral popliteal anterior tibial and peroneal arteries were patent. The posterior tibial was occluded but reconstituted distally via collaterals from the peroneal artery he was felt he should have enough blood flow to heal his wounds including the right fifth toe. The right fifth toe looks better however he is still complaining of a lot of pain. The large area which is a venous ulceration posteriorly has a better looking surface I think we can switch to Hydrofera Blue today 2/1; the patient's original wounds on the right posterior medial calf has come down in width however superior to this he has new denuded areas and I am concerned we simply do not have enough edema control. He has already undergone a right greater saphenous ablation. He had a CO2 angiogram done by Dr. Doren Custard and the comment was that we should have enough blood flow to heal the venous wound however we simply do not seem to have enough edema control with 3 layer compression. The patient is status post kidney transplant although his exact renal function is not really clear. Nor am i sure what dose of diuretic he is supposed to be on. He also has the area on the right fifth toe which was unclear etiology but I think became secondarily infected I gave him 2 weeks of antibiotics for this and this seems to have settled down he still has a black eschar over the tip of the toe. X-ray was negative for fracture. He says he has a history of gout. 2/8; the patient's wound on the right posterior calf is about the same. The superficial area medially also about the same. He got a prescription for prednisoneo Gout after he developed erythema on the dorsal  aspect of his left great toe going along with the right fifth toe which has been problematic all along. He has not taken it because he is concerned about increasing CBGs. He has a transplanted kidney is already on prednisone 5 mg. He would not be a good candidate for NSAIDs. Perhaps colchicine. He is not aware of what his uric acid level is Electronic Signature(s) Signed: 10/29/2019 6:01:37 PM By: [name] MD Entered By: [name] on 10/29/2019 10:42:03 -------------------------------------------------------------------------------- Physical Exam Details Patient Name: Date of Service: [name], [name] 10/29/2019 9:00 AM Medical Record [id number] Patient Account Number: [id number] Date of Birth/Sex: Treating RN: [birth year] (73 y.o.  M) Primary Care Provider: [name] Other Clinician: Referring Provider: Treating Provider/Extender:Thomas Rhude, [name], [name] in Treatment: 12 Constitutional Sitting or standing Blood Pressure is within target range for patient.. Pulse regular and within target range for patient.Marland Kitchen Respirations regular, non-labored and within target range.. Temperature is normal and within the target range for the patient.Marland Kitchen Appears in no distress. Eyes Conjunctivae clear. No discharge.no icterus. Cardiovascular Pedal pulses are palpable. Edema control is much better. Notes Wound exam The right fifth toe is still very sore. He has erythema over the dorsal aspect of the left first toe but does not seem to involve the interphalangeal or MTP. Nevertheless this is tender and swollen. His original wound on the right posterior calf presumably of venous insufficiency wound actually has gradually improved. He has denuded epithelium from last week on the medial aspect of this wound Electronic Signature(s) Signed: 10/29/2019 6:01:37 PM By: [name] MD Entered By: [name] on 10/29/2019  10:44:39 -------------------------------------------------------------------------------- Physician Orders Details Patient Name: Date of Service: [name], [name] 10/29/2019 9:00 AM Medical Record [id number] Patient Account Number: [id number] Date of Birth/Sex: Treating RN: 17-Sep-1946 (73 y.o. [name] Primary Care Provider: [name] Other Clinician: Referring Provider: Treating Provider/Extender:Josha Weekley, [name], [name] in Treatment: 12 Verbal / Phone Orders: No Diagnosis Coding ICD-10 Coding Code Description S80.11XD Contusion of right lower leg, subsequent encounter L97.212 Non-pressure chronic ulcer of right calf with fat layer exposed I87.321 Chronic venous hypertension (idiopathic) with inflammation of right lower extremity E11.622 Type 2 diabetes mellitus with other skin ulcer L97.518 Non-pressure chronic ulcer of other part of right foot with other specified severity Follow-up Appointments Return Appointment in 1 week. Dressing Change Frequency Change dressing three times week. - all wounds - 2x by home health on Wednesdays and Fridays, 1x by wound clinic on Mondays Skin Barriers/Peri-Wound Care Barrier cream Moisturizing lotion Wound Cleansing Clean wound with Normal Saline. - or normal saline on days that dressing is changed May shower with protection. Primary Wound Dressing Wound #3 Right,Posterior Lower Leg Hydrofera Blue - Classic Wound #5 Right Toe Fifth Calcium Alginate with Silver Wound #6 Right,Medial Lower Leg Calcium Alginate with Silver - any weeping/blistered areas Wound #7 Right,Distal,Posterior Lower Leg Calcium Alginate with Silver - any weeping/blistered areas Wound #8 Right,Lateral Lower Leg Calcium Alginate with Silver - any weeping/blistered areas Secondary Dressing Wound #3 Right,Posterior Lower Leg Foam - foam top bend of foot for protection ABD pad Zetuvit or Kerramax - or Xtrasorb Wound #5  Right Toe Fifth Kerlix/Rolled Gauze Dry Gauze Wound #6 Right,Medial Lower Leg ABD pad Zetuvit or Kerramax - or Xtrasorb Wound #7 Right,Distal,Posterior Lower Leg ABD pad Zetuvit or Kerramax - or Xtrasorb Wound #8 Right,Lateral Lower Leg ABD pad Zetuvit or Kerramax - or Xtrasorb Edema Control Unna Boot to Right Lower Extremity Avoid standing for long periods of time Elevate legs to the level of the heart or above for 30 minutes daily and/or when sitting, a frequency of: - throughout the day Exercise regularly Additional Orders / Instructions Other: - Go to ER if toe/leg gets more red/black, increased pain, fever Other: - Make follow up appt with Kidney doctor regarding uncontrolled swelling [facility] for wound care. - [facility] Electronic Signature(s) Signed: 10/29/2019 6:01:37 PM By: [name] MD Signed: 10/30/2019 5:30:42 PM By: [name] RN, BSN Entered By: [name] on 10/29/2019 09:54:29 -------------------------------------------------------------------------------- Problem List Details Patient Name: Date of Service: [name], [name] 10/29/2019 9:00 AM Medical  Record [id number] Patient Account Number: [id number] Date of Birth/Sex: Treating RN: [birth year] (74 y.o. 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 layer 08/06/2019 No Yes exposed I87.321 Chronic venous hypertension (idiopathic) with 08/06/2019 No Yes inflammation of right lower extremity E11.622 Type 2 diabetes mellitus with other skin ulcer 08/06/2019 No Yes L97.518 Non-pressure chronic  ulcer of other part of right foot 10/01/2019 No Yes with other specified severity Inactive Problems Resolved Problems Electronic Signature(s) Signed: 10/29/2019 6:01:37 PM By: [name] MD Entered By: [name] on 10/29/2019 10:40:14 -------------------------------------------------------------------------------- Progress Note Details Patient Name: Date of Service: [name], [name] 10/29/2019 9:00 AM Medical Record [id number] Patient Account Number: [id number] Date of Birth/Sex: Treating RN: 26-Nov-1945 (74 y.o. M) Primary Care Provider: [name] Other Clinician: Referring Provider: Treating Provider/Extender:Aron Inge, [name], [name] in Treatment: 12 Subjective History of Present Illness (HPI) [name] HPI Description: 74 year old gentleman who was recently seen by his nephrologist [name], and noted to have a wound on his left lower extremity which was lacerated 2 months ago and now has reopened. The patient's left shin has a ulceration with some exudate but no evidence of infection and he was referred to [place] for further care as it was known that the patient has had some peripheral vascular disease in the past. Past medical history significant for chronic kidney disease, atrial fibrillation, diabetes mellitus,status post kidney transplant in 1983 and 2005, a week fistula graft placement, status post previous bowel surgery. 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 10/06/2015 -- x-ray of the left tibia and fibula shows no evidence of osteomyelitis. The patient has also had Doppler studies of his extremity and is awaiting the appointment with the vascular surgeon. We have not yet received these reports. 10/13/2015 -- lower extremity venous duplex reflux evaluation shows reflux in the left common femoral vein, left saphenofemoral junction and the proximal greater saphenous vein extending to the  proximal calf. There is also reflux in the left proximal to mid small saphenous vein. Arterial duplex studies done showed the resting ABI was not applicable due to tibial artery medial calcification. 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 The right TBI was 1 which is normal and the left ABI was 1 which is normal. Patient has otherwise been doing fine and has been compliant with his dressings. 10/20/2015 -- He was seen by [name] recently for a vascular opinion on 10/15/2015. His left lower extremity venous insufficiency duplex study revealed GSV reflux,SS vein reflux and deep venous reflux in the common femoral vein. His ABIs were non compressible and his TBI on the right was 1.01 and on the left was 0.80. He was asked to continue with the wound care with compressive therapy followed by EVLA of the left GS vein 3 months. He recommended 20-30 mm thigh-high compression stockings and the need for a three-month trial of this. The patient had an Unna boot applied at the vascular office but he could not tolerate this with a lot of pain and issues with his toes and hence came here on  Friday for removal of this and we reapplied a 2 layer compression. 11/10/2015 -- patient still has not purchased his 20-30 mm thigh-high compression stockings as prescribed by [name]. Readmission: 08/08/18 on evaluation today patient presents for readmission concerning a new injury to the left anterior lower extremity. He was previously seen in 2017 here in our clinic. He states that he has done fairly well since that point. Nonetheless he is having at this time some pain but states that he hit this on a table that fell over and actually struck his leg. This appears to have pulled back some of his skin which folded in on itself and is causing some difficulty as far as that is concerned.  There does not appear to be any evidence of infection at this time. No fevers, chills, nausea, or vomiting noted at this time. He's been using dressings on his own currently without complication. 08/15/18 on evaluation today patient actually appears to be doing somewhat better in regard to his wound of the lower [name] when compared to the first visit last week. I had to do a much more extensive debridement at that time it does appear that I'm gonna have to perform some debridement today but it does not look to be as extensive by any means. Nonetheless fortunately he does not show any signs of infection he does have discomfort at this site. I believe based on what I'm seeing currently he may benefit from Iodoflex to help keep the wound bed clean. Patient tolerated therapy without complication. Upon evaluation today the patient actually appears to be doing excellent in regard to his left lower extremity ulcer. This is much better than the previous two visits where he had a lot of necrotic tissue around the edge of the wound simply due to the fact that again there was a significant skin tear where the edge had been cleared away prior to reattaching and being able to heal appropriately. He seems to be doing much better at this point. 08/28/18 on evaluation today the patient's wound actually does appear to be showing signs of improvement. With that being said though he is improving he would likely note even greater improvement if we were able to sharply debride the wound. Nonetheless this caused him to much discomfort he tells me. 09/04/18 on evaluation today patient actually appears to be showing signs of improvement in regard to his left lower extremity ulcer. He has been tolerating the dressing changes including the wrap although he tells me at this point that the burning does last for a couple of days even with just the Iodoflex. I was afraid that this may been part of the issue that he was having  with discomfort. It does seem to be the case. Nonetheless he shows no signs of evidence of infection at this time which is good news. No fevers chills noted ADMISSION to [name] 10/05/2018 This is a patient who was cared for in 2017 and again in the fall of this year at our sister clinic and [place]. He actually lives in [place] in [place]. We have been dealing with an apparent traumatic area on the left anterior tibial area. This is been present for the last several months. He was supposed to be using Iodoflex Kerlix and Coban however he was hospitalized from 09/05/2018 through 09/11/2018 with delirium secondary to pneumonia. Since then he is only been putting Vaseline gauze on this without compression. He also has a more recent skin tear on  the dorsal right hand that may have only happened in the last week. The patient had arterial studies done in 2017 in January which was 3 years ago. At that point he had noncompressible ABIs but really quite good TBI's both normal. Triphasic waveforms on the right monophasic at the left posterior tibial but triphasic at the left dorsalis pedis. His ABIs in our clinic today were both noncompressible 1/23; the patient has wounds on the right dorsal hand just distal to the wrist and on the left anterior lower extremity. Both of these look very healthy he is using Hydrofera Blue 1/30; left anterior lower extremity wound much smaller. Healthy looking surface. The laceration area just distal to the wrist on the dorsal hand on the right is also just about closed I used Hydrofera Blue here 2/6; left anterior lower extremity wound is much smaller but still open. The laceration area just distal to the wrist on the dorsal hand is fully epithelialized. 2/13; the patient's anterior lower extremity wound is closed. The laceration just distal to the wrist on the dorsal hand is also fully epithelialized and closed. The patient has external  compression stockings which I think are 20/30 READMISSION 08/06/2019 [name] is a 74 year old man with had several times previously in our clinic. He is a diabetic with a history of chronic renal insufficiency status post kidney transplant in 1983 and again in 2005. He was then in 2017 with a laceration on the left lower extremity. He was worked up at the time with arterial studies and reflux studies. The arterial studies showed ABIs to be noncompressible but TBI's were within normal limits. I do not have the reflux studies at the moment. He was also sent here in 2019 with a left lower extremity wound and then again in 2020 with left lower extremity trauma a skin tear on the wrist. He was discharged with 20/30 stockings identified from myself that that might not be enough compression. Nevertheless he states he was wearing these fairly reliably. In September he had a fall with a substantial bruise in the area of the wound. He says he saw orthopedics and they told him there was some muscle strain sometime it later this opened into a wound. He has a fairly substantial wound on the right posterior calf. Satellite areas around this including medially and posteriorly. He has not worn his stockings since the injury Past medical history; includes chronic renal failure secondary to diabetes with kidney transplant x2, atrial fibrillation, heart failure with preserved ejection fraction, coronary artery disease. ABIs on the right in our clinic were once again noncompressible 08/13/2019 on evaluation today patient appears to be doing decently well with regard to his wound compared to last week's evaluation. Unfortunately he is still having a lot of discomfort at this point which is I think in some part due to the 3 layer compression wrap which is a little bit stronger I think for him. When he was here before we actually utilized a Kerlix and Coban wrap which he states seemed to got a little bit better.  Nonetheless I think we can probably drop back to this in light of the discomfort that he had. Nonetheless the pain was not really right around the wound itself as much as it was around the ankle in particular. The Iodoflex does seem to have done well for him As the wound is appearing somewhat better today which is excellent news. 11/30; still complaining of a lot of pain. Apparently arterial studies I  ordered 2 weeks ago are below. I do not believe we have an appointment with vein and vascular as of yet; ABI Findings: +---------+------------------+-----+----------+--------+ Right Rt Pressure (mmHg)IndexWaveform Comment  +---------+------------------+-----+----------+--------+ PTA >254 1.50 monophasic  +---------+------------------+-----+----------+--------+ DP >254 1.50 monophasic  +---------+------------------+-----+----------+--------+ Great Toe68 0.40 Abnormal   +---------+------------------+-----+----------+--------+ +---------+------------------+-----+----------+-------+ Left Lt Pressure (mmHg)IndexWaveform Comment +---------+------------------+-----+----------+-------+ Brachial 169     +---------+------------------+-----+----------+-------+ PTA >254 1.50 monophasic  +---------+------------------+-----+----------+-------+ DP >254 1.50 monophasic  +---------+------------------+-----+----------+-------+ Great Toe40 0.24 Abnormal   +---------+------------------+-----+----------+-------+ Pedal arteries appear hyperemic. Patient refused Brachial pressure in the right arm. Summary: Right: Resting right ankle-brachial index indicates noncompressible right lower extremity arteries. The right toe- brachial index is abnormal. Left: Resting left ankle-brachial index indicates noncompressible left lower extremity arteries. The left toe-brachial index is abnormal. He is also having considerably more swelling in his left calf. This was not  there when I last saw him 2 weeks ago. He tells me that some of the home health compression wraps have been slipping down and that may be the issue here however a month I am uncertain 16/1; sees vascular on December 22. Still complaining of a lot of pain. DVT study I did last time was negative for DVT 12/14; still complaining of pain which if this is arterial is certainly claudication and rest keeps him uncomfortable at night. He has an appointment with vein and vascular on December 22. Wound surface is better using Iodoflex. Once the surface of this is satisfactory and we have exhausted the vascular route. Perhaps an advanced treatment option. He has a configuration of the venous ulceration although his arterial studies are not very good. The other issue is the patient has a transplanted kidney. This will make angiography difficult and challenging issue 12/21; still complaining of pain and drainage. We are using Iodoflex on the wound under compression. He sees Dr. Donnetta Hutching tomorrow to evaluate his noninvasive studies noted above. He has a transplanted kidney further complicating the options for angiography. 12/28; still using Iodoflex under compression. I have Dr. Luther Parody note from 12/22. he noted arterial studies revealing monophasic waveforms at the pedal vessels bilaterally and calcified vessels making the ABI unreliable. He did not comment on the reduced TBI's. He felt these were venous wounds based on the palpable dorsalis pedis pulse. 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 And they arranged for formal venous duplex with reflux studies in the next several weeks. Follow-up with either Scot Dock or Dr. Oneida Alar 09/24/2019 still using Iodoflex under compression. He has an appointment with Dr. Doren Custard on 1/7 with regards to his venous disease. The patient was not felt to have a primary arterial problem for the nonhealing of his wound. We did attempt to wrap him with 3 layer when he first came  into the clinic he complained of a lot of pain in the ankle although this may have been because the dressing fell down somewhat. He has far too edema fluid in the right leg for a good prognosis about healing this wound He comes in today with an excoriation on the bottom part of his right fifth toe. He thinks he may have done this taking off his clothes and that something got caught on the toe. There is no open wound however the toe itself is very painful 1/11; we are using Iodoflex under compression. The wound bed is clean. He went on to see Dr. Doren Custard on 09/27/2019 he again feels that the patient's arterial supply is adequate. He feels that he might benefit from right greater saphenous  vein ablation for a venous ulcer. In the meantime the area that he was complaining about last week on the right fifth toe. An x-ray that I ordered showed marked soft tissue swelling along the distal aspect of the right foot but there was no evidence of osteomyelitis. He comes in today with the fifth toenail just about coming off. He has black eschar underneath the toe on the plantar aspect. The toe was swollen red and very painful. In the right setting this could be a significant soft tissue infection versus ischemia of the toe itself. He did not show this to Dr. Doren Custard 1/18; I am using Iodoflex under compression a large wound on the right posterior calf. He had his right greater saphenous vein ablation by Dr. Doren Custard although I am not sure that is the only problem here. ooThe right fifth toe which was possibly trauma 2 weeks ago continues to be exceptionally painful with a necrotic tip. Maybe not quite as swollen. I started him empirically on Augmentin 5/125 1 p.o. twice daily last week after discussing this with Dr. Loletha Grayer of nephrology. Perhaps somewhat better this week but not as good as I was hoping. A plain x-ray was negative. He comes in today with an area on the medial right calf that was blistered and now open. In  looking at things he appears to be systemically fluid volume overloaded. He has a transplanted kidney. He states he takes his Lasix variably when he has appointments he tries not to take it the later I am really not certain if he takes this reliably. However he has far too much edema fluid in the right leg to easily heal this wound and he appears to be developing blisters medially to form additional wounds 1/25; his CO2 angiogram was done by Dr. Doren Custard last week. This showed the proximal arteries all to be patent. On the right side the common femoral deep femoral superficial femoral popliteal anterior tibial and peroneal arteries were patent. The posterior tibial was occluded but reconstituted distally via collaterals from the peroneal artery he was felt he should have enough blood flow to heal his wounds including the right fifth toe. The right fifth toe looks better however he is still complaining of a lot of pain. The large area which is a venous ulceration posteriorly has a better looking surface I think we can switch to Hydrofera Blue today 2/1; the patient's original wounds on the right posterior medial calf has come down in width however superior to this he has new denuded areas and I am concerned we simply do not have enough edema control. He has already undergone a right greater saphenous ablation. He had a CO2 angiogram done by Dr. Doren Custard and the comment was that we should have enough blood flow to heal the venous wound however we simply do not seem to have enough edema control with 3 layer compression. The patient is status post kidney transplant although his exact renal function is not really clear. Nor am i sure what dose of diuretic he is supposed to be on. He also has the area on the right fifth toe which was unclear etiology but I think became secondarily infected I gave him 2 weeks of antibiotics for this and this seems to have settled down he still has a black eschar over the tip  of the toe. X-ray was negative for fracture. He says he has a history of gout. 2/8; the patient's wound on the right posterior calf is about the  same. The superficial area medially also about the same. He got a prescription for prednisoneo Gout after he developed erythema on the dorsal aspect of his left great toe going along with the right fifth toe which has been problematic all along. He has not taken it because he is concerned about increasing CBGs. He has a transplanted kidney is already on prednisone 5 mg. He would not be a good candidate for NSAIDs. Perhaps colchicine. He is not aware of what his uric acid level is Objective Constitutional Sitting or standing Blood Pressure is within target range for patient.. Pulse regular and within target range for patient.Marland Kitchen Respirations regular, non-labored and within target range.. Temperature is normal and within the target range for the patient.Marland Kitchen Appears in no distress. Vitals Time Taken: 9:10 AM, Height: 67 in, Weight: 190 lbs, BMI: 29.8, Temperature: 99.2 F, Pulse: 56 bpm, Respiratory Rate: 19 breaths/min, Blood Pressure: 127/56 mmHg, Capillary Blood Glucose: 73 mg/dl. Eyes Conjunctivae clear. No discharge.no icterus. Cardiovascular Pedal pulses are palpable. Edema control is much better. General Notes: Wound exam ooThe right fifth toe is still very sore. He has erythema over the dorsal aspect of the left first toe but does not seem to involve the interphalangeal or MTP. Nevertheless this is tender and swollen. ooHis original wound on the right posterior calf presumably of venous insufficiency wound actually has gradually improved. He has denuded epithelium from last week on the medial aspect of this wound Integumentary (Hair, Skin) Wound #3 status is Open. Original cause of wound was Trauma. The wound is located on the Right,Posterior Lower Leg. The wound measures 16cm length x 5.1cm width x 0.4cm depth; 64.088cm^2 area and 25.635cm^3  volume. There is Fat Layer (Subcutaneous Tissue) Exposed exposed. There is no tunneling or undermining noted. There is a large amount of purulent drainage noted. The wound margin is thickened. There is large (67-100%) pink granulation within the wound bed. There is a small (1-33%) amount of necrotic tissue within the wound bed including Adherent Slough. Wound #5 status is Open. Original cause of wound was Blister. The wound is located on the Right Toe Fifth. The wound measures 0.5cm length x 0.8cm width x 0.1cm depth; 0.314cm^2 area and 0.031cm^3 volume. There is no tunneling or undermining noted. There is a small amount of serous drainage noted. The wound margin is distinct with the outline attached to the wound base. There is no granulation within the wound bed. There is a large (67- 100%) amount of necrotic tissue within the wound bed including Eschar and Adherent Slough. Wound #6 status is Open. Original cause of wound was Blister. The wound is located on the Right,Medial Lower Leg. The wound measures 7cm length x 3.5cm width x 0.1cm depth; 19.242cm^2 area and 1.924cm^3 volume. There is Fat Layer (Subcutaneous Tissue) Exposed exposed. There is no tunneling or undermining noted. There is a medium amount of serous drainage noted. The wound margin is distinct with the outline attached to the wound base. There is large (67-100%) pink granulation within the wound bed. There is no necrotic tissue within the wound bed. Wound #7 status is Open. Original cause of wound was Blister. The wound is located on the Right,Distal,Posterior Lower Leg. The wound measures 0.7cm length x 2.5cm width x 0.1cm depth; 1.374cm^2 area and 0.137cm^3 volume. There is Fat Layer (Subcutaneous Tissue) Exposed exposed. There is no tunneling or undermining noted. There is a medium amount of serous drainage noted. The wound margin is distinct with the outline attached to the wound  base. There is large (67-100%) pink  granulation within the wound bed. There is no necrotic tissue within the wound bed. Wound #8 status is Open. Original cause of wound was Blister. The wound is located on the Right,Lateral Lower Leg. The wound measures 3cm length x 2.6cm width x 0.1cm depth; 6.126cm^2 area and 0.613cm^3 volume. There is Fat Layer (Subcutaneous Tissue) Exposed exposed. There is no tunneling or undermining noted. There is a small amount of serous drainage noted. The wound margin is distinct with the outline attached to the wound base. There is large (67-100%) pink granulation within the wound bed. There is no necrotic tissue within the wound bed. 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 There was a Haematologist Compression Therapy Procedure by [name], RN. Post procedure Diagnosis Wound #3: Same as Pre-Procedure Wound #6 Pre-procedure diagnosis of Wound #6 is a Venous Leg Ulcer located on the Right,Medial Lower Leg . There was a Haematologist Compression Therapy Procedure by [name], RN. Post procedure Diagnosis Wound #6: Same as Pre-Procedure Wound #7 Pre-procedure diagnosis of Wound #7 is a Venous Leg Ulcer located on the Right,Distal,Posterior Lower Leg . There was a Haematologist Compression Therapy Procedure by [name], RN. Post procedure Diagnosis Wound #7: Same as Pre-Procedure Wound #8 Pre-procedure diagnosis of Wound #8 is a Venous Leg Ulcer located on the Right,Lateral Lower Leg . There was a Haematologist Compression Therapy Procedure by [name],  RN. Post procedure Diagnosis Wound #8: Same as Pre-Procedure Plan Follow-up Appointments: Return Appointment in 1 week. Dressing Change Frequency: Change dressing three times week. - all wounds - 2x by home health on Wednesdays and Fridays, 1x by wound clinic on Mondays Skin Barriers/Peri-Wound Care: Barrier cream Moisturizing lotion Wound Cleansing: Clean wound with Normal Saline. - or normal saline on days that dressing is changed May shower with protection. Primary Wound Dressing: Wound #3 Right,Posterior Lower Leg: Hydrofera Blue - Classic Wound #5 Right Toe Fifth: Calcium Alginate with Silver Wound #6 Right,Medial Lower Leg: Calcium Alginate with Silver - any weeping/blistered areas Wound #7 Right,Distal,Posterior Lower Leg: Calcium Alginate with Silver - any weeping/blistered areas Wound #8 Right,Lateral Lower Leg: Calcium Alginate with Silver - any weeping/blistered areas Secondary Dressing: Wound #3 Right,Posterior Lower Leg: Foam - foam top bend of foot for protection ABD pad Zetuvit or Kerramax - or Xtrasorb Wound #5 Right Toe Fifth: Kerlix/Rolled Gauze Dry Gauze Wound #6 Right,Medial Lower Leg: ABD pad Zetuvit or Kerramax - or Xtrasorb Wound #7 Right,Distal,Posterior Lower Leg: ABD pad Zetuvit or Kerramax - or Xtrasorb Wound #8 Right,Lateral Lower Leg: ABD pad Zetuvit or Kerramax - or Xtrasorb Edema Control: Unna Boot to Right Lower Extremity Avoid standing for long periods of time Elevate legs to the level of the heart or above for 30 minutes daily and/or when sitting, a frequency of: - throughout the day Exercise regularly Additional Orders / Instructions: Other: - Go to ER if toe/leg gets more red/black, increased pain, fever Other: - Make follow up appt with Kidney doctor regarding uncontrolled swelling Home Health: [facility] for wound care. - [facility] 1.  Continue with Hydrofera Blue as the primary dressing 2. Silver  alginate to the denuded areas medially 3. Admittedly he does not have a lot of options for acute gout. I went over what his primary doctor had prescribed for him. I told him to take 15 mg in addition to the 5 he already takes total of 20 for 3 days and then taper off by 5 mg/day until he is done. Although this may increase his blood sugars I think it would be temporary 4. He needs to get his uric acid level checked Electronic Signature(s) Signed: 10/29/2019 6:01:37 PM By: [name] MD Entered By: [name] on 10/29/2019 10:45:51 -------------------------------------------------------------------------------- SuperBill Details Patient Name: Date of Service: Michael, [name] 10/29/2019 Medical Record [id number] Patient Account Number: [id number] Date of Birth/Sex: Treating RN: [birth year] (73 y.o. 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

## 2019-11-05 ENCOUNTER — Other Ambulatory Visit: Payer: Self-pay

## 2019-11-05 ENCOUNTER — Encounter (HOSPITAL_BASED_OUTPATIENT_CLINIC_OR_DEPARTMENT_OTHER): Payer: Medicare Other | Admitting: Internal Medicine

## 2019-11-05 DIAGNOSIS — S8011XD Contusion of right lower leg, subsequent encounter: Secondary | ICD-10-CM | POA: Diagnosis not present

## 2019-11-05 NOTE — Progress Notes (Addendum)
COAL, NEARHOOD ([id number]) Visit Report for 11/05/2019 Arrival Information Details Patient Name: Date of Service: [name], [name] 11/05/2019 9:30 AM Medical Record [id number] Patient Account Number: [id number] Date of Birth/Sex: Treating RN: [birth year] (74 y.o. [name], Meta.Reding Primary Care Dolorez Jeffrey: [name] Other Clinician: Referring Agostino Gorin: Treating Moriah Loughry/Extender:Robson, [name], [name] in Treatment: 60 Visit Information History Since Last Visit Added or deleted any medications: No Patient Arrived: Ambulatory Any new allergies or adverse reactions: No Arrival Time: 10:13 Had a fall or experienced change in No Accompanied By: self activities of daily living that may affect Transfer Assistance: None risk of falls: Patient Identification Verified: Yes Signs or symptoms of abuse/neglect since last No Secondary Verification Process Yes visito Completed: Hospitalized since last visit: No Patient Requires Transmission-Based No Implantable device outside of the clinic excluding No Precautions: cellular tissue based products placed in the center Patient Has Alerts: Yes since last visit: Patient Alerts: non Has Dressing in Place as Prescribed: Yes compressable Has Compression in Place as Prescribed: Yes Pain Present Now: Yes Electronic Signature(s) Signed: 11/05/2019 6:19:44 PM By: [name] Entered By: [name] on 11/05/2019 10:13:44 -------------------------------------------------------------------------------- Compression Therapy Details Patient Name: Date of Service: [name] 11/05/2019 9:30 AM Medical Record [id number] Patient Account Number: [id number] Date of Birth/Sex: Treating RN: 1946/07/02 (73 y.o. [name] Primary Care Milliani Herrada: [name] Other Clinician: Referring Farha Dano: Treating Torri Langston/Extender:Robson, [name], [name] in Treatment:  13 Compression Therapy Performed for Wound Wound #3 Right,Posterior Lower Leg Assessment: Performed By: Clinician [name], RN Compression Type: Rolena Infante Post Procedure Diagnosis Same as Pre-procedure Electronic Signature(s) Signed: 11/05/2019 6:14:31 PM By: [name] RN, BSN Entered By: [name] on 11/05/2019 14:07:51 -------------------------------------------------------------------------------- Compression Therapy Details Patient Name: Date of Service: [name] 11/05/2019 9:30 AM Medical Record [id number] Patient Account Number: [id number] Date of Birth/Sex: Treating RN: [birth year] (74 y.o. [name] Primary Care Jennea Rager: [name] Other Clinician: Referring Vicci Reder: Treating Adelina Collard/Extender:Robson, [name], [name] in Treatment: 13 Compression Therapy Performed for Wound Wound #6 Right,Medial Lower Leg Assessment: Performed By: Clinician [name], RN Compression Type: Rolena Infante Post Procedure Diagnosis Same as Pre-procedure Electronic Signature(s) Signed: 11/05/2019 6:14:31 PM By: [name] RN, BSN Entered By: [name] on 11/05/2019 14:07:51 -------------------------------------------------------------------------------- Compression Therapy Details Patient Name: Date of Service: [name] 11/05/2019 9:30 AM Medical Record [id number] Patient Account Number: [id number] Date of Birth/Sex: Treating RN: Nov 08, 1945 (74 y.o. [name] Primary Care Luisfelipe Engelstad: [name] Other Clinician: Referring Destinee Taber: Treating Derrill Bagnell/Extender:Robson, [name], [name] in Treatment: 13 Compression Therapy Performed for Wound Wound #7 Right,Distal,Posterior Lower Leg Assessment: Performed By: Clinician [name], RN Compression Type: Rolena Infante Post Procedure Diagnosis Same as Pre-procedure Electronic Signature(s) Signed: 11/05/2019 6:14:31 PM  By: [name] RN, BSN Entered By: [name] on 11/05/2019 14:07:51 -------------------------------------------------------------------------------- Encounter Discharge Information Details Patient Name: Date of Service: [name] 11/05/2019 9:30 AM Medical Record [id number] Patient Account Number: [id number] Date of Birth/Sex: Treating RN: 1945-12-04 (73 y.o. [name] Primary Care Marietta Sikkema: [name] Other Clinician: Referring Jasani Dolney: Treating Dyshaun Bonzo/Extender:Robson, [name], [name] in Treatment: 13 Encounter Discharge Information Items Discharge Condition: Stable Ambulatory Status: Ambulatory Discharge Destination: Home Transportation: Private Auto Accompanied By: self Schedule Follow-up Appointment: Yes Clinical Summary of Care: Electronic Signature(s) Signed: 11/05/2019 6:19:44 PM By: [name] Entered By: [name] on 11/05/2019 11:24:14 -------------------------------------------------------------------------------- Lower Extremity Assessment Details Patient Name: Date of Service: [name] 11/05/2019 9:30 AM  Medical Record [id number] Patient Account Number: [id number] Date of Birth/Sex: Treating RN: 07/04/1946 (74 y.o. [name] Primary Care Tiaria Biby: [name] Other Clinician: Referring Edwina Grossberg: Treating Romie Keeble/Extender:Robson, [name], [name] in Treatment: 13 Edema Assessment Assessed: [Left: No] [Right: Yes] Edema: [Left: Ye] [Right: s] Calf Left: Right: Point of Measurement: 30 cm From Medial Instep cm 38 cm Ankle Left: Right: Point of Measurement: 10 cm From Medial Instep cm 25 cm Electronic Signature(s) Signed: 11/05/2019 6:19:44 PM By: [name] Entered By: [name] on 11/05/2019 10:20:18 -------------------------------------------------------------------------------- Multi Wound Chart Details Patient Name: Date of  Service: [name] 11/05/2019 9:30 AM Medical Record [id number] Patient Account Number: [id number] Date of Birth/Sex: Treating RN: October 19, 1945 (73 y.o. 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] Comorbid History: [3:Cataracts, Anemia, Arrhythmia, Congestive Heart Failure, Hypertension, Heart Failure, Hypertension, Heart Failure, Hypertension, Type II Diabetes, Gout, Neuropathy] [5:Cataracts, Anemia, Arrhythmia, Congestive Type II Diabetes, Gout,  Neuropathy] [6:Cataracts, Anemia, Arrhythmia, Congestive Type II Diabetes, Gout, Neuropathy] Date Acquired: [3:06/06/2019] [5:09/19/2019] [6:10/08/2019] Weeks of Treatment: [3:13] [5:6] [6:4] Wound Status: [3:Open] [5:Open] [6:Open] Measurements L x W x D 16.3x5x0.3 [5:0.6x0.9x0.3] [6:3x1x0.1] (cm) Area (cm) : [3:64.01] [5:0.424] [6:2.356] Volume (cm) : [3:19.203] [5:0.127] [6:0.236] % Reduction in Area: [3:-184.00%] [5:28.00%] [6:79.60%] % Reduction in Volume: -184.00% [5:-115.30%] [6:79.60%] Classification: [3:Full Thickness Without Exposed Support Structures] [5:Grade 2] [6:Full Thickness Without Exposed Support Structures] Exudate Amount: [3:Large] [5:Small] [6:Medium] Exudate Type: [3:Serosanguineous] [5:Serous] [6:Serous] Exudate Color: [3:red, brown] [5:amber] [6:amber] Wound Margin:  [3:Thickened] [5:Distinct, outline attached Distinct, outline attached] Granulation Amount: [3:Large (67-100%)] [5:None Present (0%)] [6:Large (67-100%)] Granulation Quality: [3:Pink] [5:N/A] [6:Pink] Necrotic Amount: [3:None Present (0%)] [5:Large (67-100%)] [6:Small (1-33%)] Necrotic Tissue: [3:N/A] [5:Eschar, Adherent Slough] [6:Adherent Slough] Exposed Structures: [3:Fat Layer (Subcutaneous Tissue) Exposed: Yes Fascia: No Tendon: No Muscle: No Joint: No Bone: No] [5:Fascia: No Fat Layer (Subcutaneous Tissue) Exposed: No Tendon: No Muscle: No Joint: No Bone: No] [6:Fat Layer (Subcutaneous Tissue) Exposed: Yes  Fascia: No Tendon: No Muscle: No Joint: No Bone: No] Epithelialization: [3:Small (1-33%)] [5:None 7 8] [6:Large (67-100%) N/A] Photos: [3:No Photos] [5:No Photos] [6:N/A] Wound Location: [3:Right Lower Leg - Posterior, Right, Lateral Lower Leg N/A Distal] Wounding Event: [3:Blister] [5:Blister] [6:N/A] 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] [5:Venous Leg Ulcer] [6:N/A] Comorbid History: [3:Cataracts, Anemia, Arrhythmia, Congestive Heart Failure, Hypertension, Type II Diabetes, Gout, Neuropathy] [5:N/A] [6:N/A] Date Acquired: [3:10/08/2019] [5:10/22/2019] [6:N/A] Weeks of Treatment: [3:4] [5:2] [6:N/A] Wound Status: [3:Open] [5:Healed - Epithelialized N/A] Measurements L x W x D 1x1x0.1 [5:0x0x0] [6:N/A] (cm) Area (cm) : [3:0.785] [5:0] [6:N/A] Volume (cm) : [3:0.079] [5:0] [6:N/A] % Reduction in Area: [3:66.70%] [5:100.00%] [6:N/A] % Reduction in Volume: 66.50% [5:100.00%] [6:N/A] Classification: [3:Full Thickness Without Exposed Support Structures Exposed Support Structures] [5:Full Thickness Without] [6:N/A] Exudate Amount: [3:Medium] [5:N/A] [6:N/A] Exudate Type: [3:Serous] [5:N/A] [6:N/A] Exudate Color: [3:amber] [5:N/A] [6:N/A] Wound Margin: [3:Distinct, outline attached N/A] [6:N/A] Granulation Amount: [3:Large (67-100%)] [5:N/A] [6:N/A] Granulation Quality: [3:Pink]  [5:N/A] [6:N/A] Necrotic Amount: [3:None Present (0%)] [5:N/A] [6:N/A] Necrotic Tissue: [3:N/A] [5:N/A] [6:N/A] Exposed  Structures: [3:Fat Layer (Subcutaneous N/A Tissue) Exposed: Yes Fascia: No Tendon: No Muscle: No Joint: No Bone: No None] [5:N/A] [6:N/A N/A] Treatment Notes Electronic Signature(s) Signed: 11/05/2019 6:14:31 PM By: [name] RN, BSN Signed: 11/05/2019 6:16:05 PM By: [name] MD Entered By: [name] on 11/05/2019 11:17:27 -------------------------------------------------------------------------------- [place] Details Patient Name: Date of Service: OMARE, BILOTTA 11/05/2019 9:30 AM Medical Record [id number] Patient Account Number: [id number] Date of Birth/Sex: Treating RN: [birth year] (73 y.o. [name] Primary Care Caydence Enck: [name] Other Clinician: Referring Zhanna Melin: Treating Alekzander Cardell/Extender:Robson, [name], [name] in Treatment: 13 Active Inactive Venous Leg Ulcer Nursing Diagnoses: Knowledge deficit related to disease process and management Goals: Patient will maintain optimal edema control Date Initiated: 08/06/2019 Target Resolution Date: 11/30/2019 Goal Status: Active Patient/caregiver will verbalize understanding of disease process and disease management Date Initiated: 08/06/2019 Date Inactivated: 09/03/2019 Target Resolution Date: 09/07/2019 Goal Status: Met Interventions: Assess peripheral edema status every visit. Compression as ordered Provide education on venous insufficiency Notes: Wound/Skin Impairment Nursing Diagnoses: Impaired tissue integrity Goals: Patient/caregiver will verbalize understanding of skin care regimen Date Initiated: 08/06/2019 Target Resolution Date: 11/30/2019 Goal Status: Active Ulcer/skin breakdown will have a volume reduction of 30% by week 4 Date Initiated: 08/06/2019 Date Inactivated: 09/03/2019 Target Resolution Date:  09/07/2019 Unmet Reason: PAD, Goal Status: Unmet necrotic surface Interventions: Assess patient/caregiver ability to obtain necessary supplies Assess patient/caregiver ability to perform ulcer/skin care regimen upon admission and as needed Assess ulceration(s) every visit Provide education on ulcer and skin care Notes: Electronic Signature(s) Signed: 11/05/2019 6:14:31 PM By: [name] RN, BSN Entered By: [name] on 11/05/2019 14:07:24 -------------------------------------------------------------------------------- Pain Assessment Details Patient Name: Date of Service: DANDRE, SISLER 11/05/2019 9:30 AM Medical Record [id number] Patient Account Number: [id number] Date of Birth/Sex: Treating RN: [birth year] (74 y.o. [name] Primary Care Tamiko Leopard: [name] Other Clinician: Referring Rilei Kravitz: Treating Lanaiya Lantry/Extender:Robson, [name], [name] in Treatment: 13 Active Problems Location of Pain Severity and Description of Pain Patient Has Paino Yes Site Locations Pain Location: Pain in Ulcers Rate the pain. Current Pain Level: 3 Worst Pain Level: 10 Least Pain Level: 0 Tolerable Pain Level: 8 Pain Management and Medication Current Pain Management: Medication: No Cold Application: No Rest: No Massage: No Activity: No T.E.N.S.: No Heat Application: No Leg drop or elevation: No Is the Current Pain Management Adequate: Adequate How does your wound impact your activities of daily livingo Sleep: No Bathing: No Appetite: No Relationship With Others: No Bladder Continence: No Emotions: No Bowel Continence: No Work: No Toileting: No Drive: No Dressing: No Hobbies: No Electronic Signature(s) Signed: 11/05/2019 6:19:44 PM By: [name] Entered By: [name] on 11/05/2019 10:20:08 -------------------------------------------------------------------------------- Patient/Caregiver Education Details Patient  Name: Date of Service: ANKUSH, GINTZ 2/15/2021andnbsp9:30 AM Medical Record [id number] Patient Account Number: [id number] Date of Birth/Gender: Treating RN: Jan 21, 1946 (74 y.o. [name] Primary Care Physician: [name] Other Clinician: Referring Physician: Treating Physician/Extender:Robson, [name], [name] in Treatment: 13 Education Assessment Education Provided To: Patient Education Topics Provided Wound/Skin Impairment: Methods: Explain/Verbal Responses: State content correctly [employer]) Signed: 11/05/2019 6:14:31 PM By: [name] RN, BSN Entered By: [name] on 11/05/2019 14:07:34 -------------------------------------------------------------------------------- Wound Assessment Details Patient Name: Date of Service: JATIN, NAUMANN 11/05/2019 9:30 AM Medical Record [id number] Patient Account Number: [id number] Date of Birth/Sex: Treating RN: June 02, 1946 (73 y.o. [name] Primary Care Lastacia Solum: [name] Other Clinician: Referring Jayon Matton: Treating Ahmaud Duthie/Extender:Robson, [name], [name] in  Treatment: 13 Wound Status Wound Number: 3 Primary Venous Leg Ulcer Etiology: Wound Location: Right Lower Leg - Posterior Wound Open Wounding Event: Trauma Status: Date Acquired: 06/06/2019 Comorbid Cataracts, Anemia, Arrhythmia, Congestive Weeks Of Treatment: 13 History: Heart Failure, Hypertension, Type II Diabetes, Clustered Wound: No Gout, Neuropathy Wound Measurements Length: (cm) 16.3 % Reduct Width: (cm) 5 % Reduct Depth: (cm) 0.3 Epitheli Area: (cm) 64.01 Tunneli Volume: (cm) 19.203 Undermi Wound Description Classification: Full Thickness Without Exposed Support Foul Odo Structures Slough/F Wound Thickened Margin: Exudate Large Amount: Exudate Serosanguineous Type: Exudate red, brown Color: Wound Bed Granulation Amount: Large  (67-100%) Granulation Quality: Pink Fascia E Necrotic Amount: None Present (0%) Fat Laye Tendon E Muscle E Joint Ex Bone Exp r After Cleansing: No ibrino No Exposed Structure xposed: No r (Subcutaneous Tissue) Exposed: Yes xposed: No xposed: No posed: No osed: No ion in Area: -184% ion in Volume: -184% alization: Small (1-33%) ng: No ning: No Electronic Signature(s) Signed: 11/05/2019 6:19:44 PM By: [name] Entered By: [name] on 11/05/2019 10:28:05 -------------------------------------------------------------------------------- Wound Assessment Details Patient Name: Date of Service: MARTAVIOUS, HARTEL 11/05/2019 9:30 AM Medical Record [id number] Patient Account Number: [id number] Date of Birth/Sex: Treating RN: 1946-01-02 (74 y.o. [name] Primary Care Dyasia Firestine: [name] Other Clinician: Referring Nina Hoar: Treating Abbigayle Toole/Extender:Robson, [name], [name] in Treatment: 13 Wound Status Wound Number: 5 Primary Diabetic Wound/Ulcer of the Lower Extremity Etiology: Wound Location: Right Toe Fifth Wound Open Wounding Event: Blister Status: Date Acquired: 09/19/2019 Comorbid Cataracts, Anemia, Arrhythmia, Congestive Weeks Of Treatment: 6 History: Heart Failure, Hypertension, Type II Diabetes, Clustered Wound: No Gout, Neuropathy Photos Wound Measurements Length: (cm) 0.6 Width: (cm) 0.9 Depth: (cm) 0.3 Area: (cm) 0.424 Volume: (cm) 0.127 Wound Description Classification: Grade 2 Wound Margin: Distinct, outline attached Exudate Amount: Small Exudate Type: Serous Exudate Color: amber Wound Bed Granulation Amount: None Present (0%) Necrotic Amount: Large (67-100%) Necrotic Quality: Eschar, Adherent Slough Foul Odor After Cleansing: No Slough/Fibrino Yes Exposed Structure Fascia Exposed: Fat Layer (Subcutaneous Tissue) Exposed: Tendon Exposed: Muscle Exposed: Joint Exposed: Bone Exposed: %  Reduction in Area: 28% % Reduction in Volume: -115.3% Epithelialization: None Tunneling: No Undermining: No No No No No No No Electronic Signature(s) Signed: 11/06/2019 4:25:10 PM By: Mikeal Hawthorne EMT/HBOT Signed: 11/15/2019 8:56:47 AM By: [name] RN, BSN Previous Signature: 11/05/2019 6:19:44 PM Version By: [name] Entered By: Mikeal Hawthorne on 11/06/2019 15:24:14 -------------------------------------------------------------------------------- Wound Assessment Details Patient Name: Date of Service: LORENA, CLEARMAN 11/05/2019 9:30 AM Medical Record [id number] Patient Account Number: [id number] Date of Birth/Sex: Treating RN: [birth year] (74 y.o. [name] Primary Care Mikyle Sox: [name] Other Clinician: Referring Rommie Dunn: Treating Sameka Bagent/Extender:Robson, [name], [name] in Treatment: 13 Wound Status Wound Number: 6 Primary Venous Leg Ulcer Etiology: Wound Location: Right Lower Leg - Medial Wound Open Wounding Event: Blister Wounding Event: Blister Status: Date Acquired: 10/08/2019 Comorbid Cataracts, Anemia, Arrhythmia, Congestive Weeks Of Treatment: 4 History: Heart Failure, Hypertension, Type II Diabetes, Clustered Wound: No Gout, Neuropathy Photos Wound Measurements Length: (cm) 3 % Reduct Width: (cm) 1 % Reduct Depth: (cm) 0.1 Epitheli Area: (cm) 2.356 Tunneli Volume: (cm) 0.236 Undermi Wound Description Full Thickness Without Exposed Support Foul Odo Classification: Structures Slough/F Wound Distinct, outline attached Margin: Exudate Medium Amount: Exudate Serous Type: Exudate amber Color: Wound Bed Granulation Amount: Large (67-100%) Granulation Quality: Pink Fascia E Necrotic Amount: Small (1-33%) Fat Laye Necrotic Quality: Adherent Slough Tendon E Muscle E Joint Ex Bone Exp r After Cleansing: No  ibrino Yes Exposed Structure xposed: No r (Subcutaneous Tissue) Exposed:  Yes xposed: No xposed: No posed: No osed: No ion in Area: 79.6% ion in Volume: 79.6% alization: Large (67-100%) ng: No ning: No Electronic Signature(s) Signed: 11/06/2019 4:25:10 PM By: Mikeal Hawthorne EMT/HBOT Signed: 11/15/2019 8:56:47 AM By: [name] RN, BSN Previous Signature: 11/05/2019 6:19:44 PM Version By: [name] Entered By: Mikeal Hawthorne on 11/06/2019 15:26:22 -------------------------------------------------------------------------------- Wound Assessment Details Patient Name: Date of Service: OLLEN, RAO 11/05/2019 9:30 AM Medical Record [id number] Patient Account Number: [id number] Date of Birth/Sex: Treating RN: [birth year] (74 y.o. [name], Tammi Klippel Primary Care Aarush Stukey: [name] Other Clinician: Referring Kushi Kun: Treating Felice Deem/Extender:Robson, [name], [name] in Treatment: 13 Wound Status Wound Number: 7 Primary Venous Leg Ulcer Etiology: Wound Location: Right Lower Leg - Posterior, Distal Wound Open Wounding Event: Blister Status: Date Acquired: 10/08/2019 Comorbid Cataracts, Anemia, Arrhythmia, Congestive Weeks Of Treatment: 4 History: Heart Failure, Hypertension, Type II Diabetes, Clustered Wound: No Gout, Neuropathy Wound Measurements Length: (cm) 1 % Reduc Width: (cm) 1 % Reduc Depth: (cm) 0.1 Epithel Area: (cm) 0.785 Tunnel Volume: (cm) 0.079 Underm Wound Description Full Thickness Without Exposed Support Classification: Structures Wound Distinct, outline attached Margin: Exudate Medium Amount: Exudate Serous Type: Exudate amber Color: Wound Bed Granulation Amount: Large (67-100%) Granulation Quality: Pink Necrotic Amount: None Present (0%) Foul Odor After Cleansing: No Slough/Fibrino No Exposed Structure Fascia Exposed: No Fat Layer (Subcutaneous Tissue) Exposed: Yes Tendon Exposed: No Muscle Exposed: No Joint Exposed: No Bone Exposed: No tion in Area: 66.7% tion in  Volume: 66.5% ialization: None ing: No ining: No Electronic Signature(s) Signed: 11/05/2019 6:19:44 PM By: [name] Entered By: [name] on 11/05/2019 10:29:30 -------------------------------------------------------------------------------- Wound Assessment Details Patient Name: Date of Service: ABDURRAHMAN, PETERSHEIM 11/05/2019 9:30 AM Medical Record [id number] Patient Account Number: [id number] Date of Birth/Sex: Treating RN: 10-13-1945 (74 y.o. [name], Meta.Reding Primary Care Wynonna Fitzhenry: Other Clinician: [name] Referring Jane Birkel: Treating Nylia Gavina/Extender:Robson, [name], [name] in Treatment: 13 Wound Status Wound Number: 8 Primary Venous Leg Ulcer Etiology: Wound Location: Right Lower Leg - Lateral Wound Healed - Epithelialized Wounding Event: Blister Status: Date Acquired: 10/22/2019 Comorbid Cataracts, Anemia, Arrhythmia, Congestive Weeks Of Treatment: 2 History: Heart Failure, Hypertension, Type II Diabetes, Clustered Wound: No Gout, Neuropathy Photos Wound Measurements Length: (cm) 0 % Reduct Width: (cm) 0 % Reduct Depth: (cm) 0 Epitheli Area: (cm) 0 Volume: (cm) 0 Wound Description Full Thickness Without Exposed Support Foul Odo Classification: Structures Slough/F Wound Distinct, outline attached Margin: Exudate Small Amount: Exudate Serous Type: Exudate amber Color: Wound Bed Granulation Amount: Large (67-100%) Granulation Quality: Pink Fascia E Necrotic Amount: None Present (0%) Fat Laye Tendon E Muscle E Joint Ex Bone Exp r After Cleansing: No ibrino No Exposed Structure xposed: No r (Subcutaneous Tissue) Exposed: Yes xposed: No xposed: No posed: No osed: No ion in Area: 100% ion in Volume: 100% alization: None Electronic Signature(s) Signed: 11/06/2019 4:25:10 PM By: Mikeal Hawthorne EMT/HBOT Signed: 11/07/2019 5:44:15 PM By: [name] Previous Signature: 11/05/2019 6:19:44 PM Version By:  [name] Entered By: Mikeal Hawthorne on 11/06/2019 15:26:00 -------------------------------------------------------------------------------- Vitals Details Patient Name: Date of Service: SEDRIC, GUIA 11/05/2019 9:30 AM Medical Record [id number] Patient Account Number: [id number] Date of Birth/Sex: Treating RN: 23-Jun-1946 (74 y.o. [name] Primary Care Jaremy Nosal: [name] Other Clinician: Referring Reynolds Kittel: Treating Cerys Winget/Extender:Robson, [name], [name] in Treatment: 13 Vital Signs Time Taken: 10:14 Temperature (F): 98.3 Height (in): 67 Pulse (bpm): 53  Weight (lbs): 190 Respiratory Rate (breaths/min): 20 Body Mass Index (BMI): 29.8 Blood Pressure (mmHg): 144/62 Capillary Blood Glucose (mg/dl): 69 Reference Range: 80 - 120 mg / dl Notes per patient ate after checking blood glucose. Electronic Signature(s) Signed: 11/05/2019 6:19:44 PM By: [name] Entered By: [name] on 11/05/2019 10:19:51

## 2019-11-05 NOTE — Progress Notes (Signed)
Michael, [name] ([id number]) Visit Report for 11/05/2019 HPI Details Patient Name: Date of Service: Michael, [name] 11/05/2019 9:30 AM Medical Record [id number] Patient Account Number: [id number] Date of Birth/Sex: Treating RN: 09/06/1946 (74 y.o. [name] Primary Care Provider: [name] Other Clinician: Referring Provider: Treating Provider/Extender:Ludie Hudon, [name], [name] in Treatment: 13 History of Present Illness HPI Description: [name] HPI Description: 74 year old gentleman who was recently seen by his nephrologist [name], and noted to have a wound on his left lower extremity which was lacerated 2 months ago and now has reopened. The patient's left shin has a ulceration with some exudate but no evidence of infection and he was referred to [place] for further care as it was known that the patient has had some peripheral vascular disease in the past. Past medical history significant for chronic kidney disease, atrial fibrillation, diabetes mellitus,status post kidney transplant in 1983 and 2005, a week fistula graft placement, status post previous bowel surgery. 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 10/06/2015 -- x-ray of the left tibia and fibula shows no evidence of osteomyelitis. The patient has also had Doppler studies of his extremity and is awaiting the appointment with the vascular surgeon. We have not yet received these reports. 10/13/2015 -- lower extremity venous duplex reflux evaluation shows reflux in the left common femoral vein, left saphenofemoral junction and the proximal greater saphenous vein extending to the proximal calf. There is also reflux in the left proximal to mid small saphenous vein. Arterial duplex studies done showed the resting ABI was not applicable due to tibial artery medial calcification. 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 The right TBI was 1 which is normal and the left ABI was 1 which is normal. Patient has otherwise been doing fine and has been compliant with his dressings. 10/20/2015 -- He was seen by [name] recently for a vascular opinion on 10/15/2015. His left lower extremity venous insufficiency duplex study revealed GSV reflux,SS vein reflux and deep venous reflux in the common femoral vein. His ABIs were non compressible and his TBI on the right was 1.01 and on the left was 0.80. He was asked to continue with the wound care with compressive therapy followed by EVLA of the left GS vein 3 months. He recommended 20-30 mm thigh-high compression stockings and the need for a three-month trial of this. The patient had an Unna boot applied at the vascular office but he could not tolerate this with a lot of pain and issues with his toes and hence came here on Friday for removal of this and we reapplied a 2 layer compression. 11/10/2015 -- patient still has not purchased his 20-30 mm thigh-high compression stockings as prescribed by [name]. Readmission: 08/08/18 on evaluation today patient presents for readmission concerning a new injury to the left anterior lower extremity. He was previously seen in 2017 here in our clinic. He states that he has done fairly well since that point. Nonetheless he is having at this time some pain but states that he hit this on a table that fell over and actually struck his leg. This appears to have pulled back some of his skin which folded in on itself and is causing some difficulty as far as that is concerned. There does not appear to be any evidence of  infection at this time. No fevers, chills, nausea, or vomiting noted at this time. He's been using dressings on his own currently without complication. 08/15/18 on evaluation today patient actually appears to be  doing somewhat better in regard to his wound of the lower [name] when compared to the first visit last week. I had to do a much more extensive debridement at that time it does appear that I'm gonna have to perform some debridement today but it does not look to be as extensive by any means. Nonetheless fortunately he does not show any signs of infection he does have discomfort at this site. I believe based on what I'm seeing currently he may benefit from Iodoflex to help keep the wound bed clean. Patient tolerated therapy without complication. Upon evaluation today the patient actually appears to be doing excellent in regard to his left lower extremity ulcer. This is much better than the previous two visits where he had a lot of necrotic tissue around the edge of the wound simply due to the fact that again there was a significant skin tear where the edge had been cleared away prior to reattaching and being able to heal appropriately. He seems to be doing much better at this point. 08/28/18 on evaluation today the patient's wound actually does appear to be showing signs of improvement. With that being said though he is improving he would likely note even greater improvement if we were able to sharply debride the wound. Nonetheless this caused him to much discomfort he tells me. 09/04/18 on evaluation today patient actually appears to be showing signs of improvement in regard to his left lower extremity ulcer. He has been tolerating the dressing changes including the wrap although he tells me at this point that the burning does last for a couple of days even with just the Iodoflex. I was afraid that this may been part of the issue that he was having with discomfort. It does seem to be the case. Nonetheless he shows no signs of evidence of infection at this time which is good news. No fevers chills noted ADMISSION to [name] 10/05/2018 This is a patient who was cared for in 2017  and again in the fall of this year at our sister clinic and [place]. He actually lives in [place] in [place]. We have been dealing with an apparent traumatic area on the left anterior tibial area. This is been present for the last several months. He was supposed to be using Iodoflex Kerlix and Coban however he was hospitalized from 09/05/2018 through 09/11/2018 with delirium secondary to pneumonia. Since then he is only been putting Vaseline gauze on this without compression. He also has a more recent skin tear on the dorsal right hand that may have only happened in the last week. The patient had arterial studies done in 2017 in January which was 3 years ago. At that point he had noncompressible ABIs but really quite good TBI's both normal. Triphasic waveforms on the right monophasic at the left posterior tibial but triphasic at the left dorsalis pedis. His ABIs in our clinic today were both noncompressible 1/23; the patient has wounds on the right dorsal hand just distal to the wrist and on the left anterior lower extremity. Both of these look very healthy he is using Hydrofera Blue 1/30; left anterior lower extremity wound much smaller. Healthy looking surface. The laceration area just distal to the wrist on the dorsal hand on the right is also just  about closed I used Hydrofera Blue here 2/6; left anterior lower extremity wound is much smaller but still open. The laceration area just distal to the wrist on the dorsal hand is fully epithelialized. 2/13; the patient's anterior lower extremity wound is closed. The laceration just distal to the wrist on the dorsal hand is also fully epithelialized and closed. The patient has external compression stockings which I think are 20/30 READMISSION 08/06/2019 [name] is a 74 year old man with had several times previously in our clinic. He is a diabetic with a history of chronic renal insufficiency status post kidney transplant in 1983 and again  in 2005. He was then in 2017 with a laceration on the left lower extremity. He was worked up at the time with arterial studies and reflux studies. The arterial studies showed ABIs to be noncompressible but TBI's were within normal limits. I do not have the reflux studies at the moment. He was also sent here in 2019 with a left lower extremity wound and then again in 2020 with left lower extremity trauma a skin tear on the wrist. He was discharged with 20/30 stockings identified from myself that that might not be enough compression. Nevertheless he states he was wearing these fairly reliably. In September he had a fall with a substantial bruise in the area of the wound. He says he saw orthopedics and they told him there was some muscle strain sometime it later this opened into a wound. He has a fairly substantial wound on the right posterior calf. Satellite areas around this including medially and posteriorly. He has not worn his stockings since the injury Past medical history; includes chronic renal failure secondary to diabetes with kidney transplant x2, atrial fibrillation, heart failure with preserved ejection fraction, coronary artery disease. ABIs on the right in our clinic were once again noncompressible 08/13/2019 on evaluation today patient appears to be doing decently well with regard to his wound compared to last week's evaluation. Unfortunately he is still having a lot of discomfort at this point which is I think in some part due to the 3 layer compression wrap which is a little bit stronger I think for him. When he was here before we actually utilized a Kerlix and Coban wrap which he states seemed to got a little bit better. Nonetheless I think we can probably drop back to this in light of the discomfort that he had. Nonetheless the pain was not really right around the wound itself as much as it was around the ankle in particular. The Iodoflex does seem to have done well for him As the  wound is appearing somewhat better today which is excellent news. 11/30; still complaining of a lot of pain. Apparently arterial studies I ordered 2 weeks ago are below. I do not believe we have an appointment with vein and vascular as of yet; ABI Findings: +---------+------------------+-----+----------+--------+ Right Rt Pressure (mmHg)IndexWaveform Comment  +---------+------------------+-----+----------+--------+ PTA >254 1.50 monophasic  +---------+------------------+-----+----------+--------+ DP >254 1.50 monophasic  +---------+------------------+-----+----------+--------+ Great Toe68 0.40 Abnormal   +---------+------------------+-----+----------+--------+ +---------+------------------+-----+----------+-------+ Left Lt Pressure (mmHg)IndexWaveform Comment +---------+------------------+-----+----------+-------+ Brachial 169     +---------+------------------+-----+----------+-------+ PTA >254 1.50 monophasic  +---------+------------------+-----+----------+-------+ DP >254 1.50 monophasic  +---------+------------------+-----+----------+-------+ Great Toe40 0.24 Abnormal   +---------+------------------+-----+----------+-------+ Pedal arteries appear hyperemic. Patient refused Brachial pressure in the right arm. Summary: Right: Resting right ankle-brachial index indicates noncompressible right lower extremity arteries. The right toe- brachial index is abnormal. Left: Resting left ankle-brachial index indicates noncompressible left lower extremity arteries. The left toe-brachial index is abnormal.  He is also having considerably more swelling in his left calf. This was not there when I last saw him 2 weeks ago. He tells me that some of the home health compression wraps have been slipping down and that may be the issue here however a month I am uncertain 13/2; sees vascular on December 22. Still complaining of a lot of pain. DVT study  I did last time was negative for DVT 12/14; still complaining of pain which if this is arterial is certainly claudication and rest keeps him uncomfortable at night. He has an appointment with vein and vascular on December 22. Wound surface is better using Iodoflex. Once the surface of this is satisfactory and we have exhausted the vascular route. Perhaps an advanced treatment option. He has a configuration of the venous ulceration although his arterial studies are not very good. The other issue is the patient has a transplanted kidney. This will make angiography difficult and challenging issue 12/21; still complaining of pain and drainage. We are using Iodoflex on the wound under compression. He sees Dr. Donnetta Hutching tomorrow to evaluate his noninvasive studies noted above. He has a transplanted kidney further complicating the options for angiography. 12/28; still using Iodoflex under compression. I have Dr. Luther Parody note from 12/22. he noted arterial studies revealing monophasic waveforms at the pedal vessels bilaterally and calcified vessels making the ABI unreliable. He did not comment on the reduced TBI's. He felt these were venous wounds based on the palpable dorsalis pedis pulse. 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 And they arranged for formal venous duplex with reflux studies in the next several weeks. Follow-up with either Scot Dock or Dr. Oneida Alar 09/24/2019 still using Iodoflex under compression. He has an appointment with Dr. Doren Custard on 1/7 with regards to his venous disease. The patient was not felt to have a primary arterial problem for the nonhealing of his wound. We did attempt to wrap him with 3 layer when he first came into the clinic he complained of a lot of pain in the ankle although this may have been because the dressing fell down somewhat. He has far too edema fluid in the right leg for a good prognosis about healing this wound He comes in today with an excoriation on the  bottom part of his right fifth toe. He thinks he may have done this taking off his clothes and that something got caught on the toe. There is no open wound however the toe itself is very painful 1/11; we are using Iodoflex under compression. The wound bed is clean. He went on to see Dr. Doren Custard on 09/27/2019 he again feels that the patient's arterial supply is adequate. He feels that he might benefit from right greater saphenous vein ablation for a venous ulcer. In the meantime the area that he was complaining about last week on the right fifth toe. An x-ray that I ordered showed marked soft tissue swelling along the distal aspect of the right foot but there was no evidence of osteomyelitis. He comes in today with the fifth toenail just about coming off. He has black eschar underneath the toe on the plantar aspect. The toe was swollen red and very painful. In the right setting this could be a significant soft tissue infection versus ischemia of the toe itself. He did not show this to Dr. Doren Custard 1/18; I am using Iodoflex under compression a large wound on the right posterior calf. He had his right greater saphenous vein ablation by Dr. Doren Custard  although I am not sure that is the only problem here. The right fifth toe which was possibly trauma 2 weeks ago continues to be exceptionally painful with a necrotic tip. Maybe not quite as swollen. I started him empirically on Augmentin 5/125 1 p.o. twice daily last week after discussing this with Dr. Loletha Grayer of nephrology. Perhaps somewhat better this week but not as good as I was hoping. A plain x-ray was negative. He comes in today with an area on the medial right calf that was blistered and now open. In looking at things he appears to be systemically fluid volume overloaded. He has a transplanted kidney. He states he takes his Lasix variably when he has appointments he tries not to take it the later I am really not certain if he takes this reliably. However he has  far too much edema fluid in the right leg to easily heal this wound and he appears to be developing blisters medially to form additional wounds 1/25; his CO2 angiogram was done by Dr. Doren Custard last week. This showed the proximal arteries all to be patent. On the right side the common femoral deep femoral superficial femoral popliteal anterior tibial and peroneal arteries were patent. The posterior tibial was occluded but reconstituted distally via collaterals from the peroneal artery he was felt he should have enough blood flow to heal his wounds including the right fifth toe. The right fifth toe looks better however he is still complaining of a lot of pain. The large area which is a venous ulceration posteriorly has a better looking surface I think we can switch to Hydrofera Blue today 2/1; the patient's original wounds on the right posterior medial calf has come down in width however superior to this he has new denuded areas and I am concerned we simply do not have enough edema control. He has already undergone a right greater saphenous ablation. He had a CO2 angiogram done by Dr. Doren Custard and the comment was that we should have enough blood flow to heal the venous wound however we simply do not seem to have enough edema control with 3 layer compression. The patient is status post kidney transplant although his exact renal function is not really clear. Nor am i sure what dose of diuretic he is supposed to be on. He also has the area on the right fifth toe which was unclear etiology but I think became secondarily infected I gave him 2 weeks of antibiotics for this and this seems to have settled down he still has a black eschar over the tip of the toe. X-ray was negative for fracture. He says he has a history of gout. 2/8; the patient's wound on the right posterior calf is about the same. The superficial area medially also about the same. He got a prescription for prednisoneo Gout after he developed  erythema on the dorsal aspect of his left great toe going along with the right fifth toe which has been problematic all along. He has not taken it because he is concerned about increasing CBGs. He has a transplanted kidney is already on prednisone 5 mg. He would not be a good candidate for NSAIDs. Perhaps colchicine. He is not aware of what his uric acid level is 2/15; his right posterior calf wound seems to be coming in in terms of width. Everything here looks fairly good. No mechanical debridement we have been using Hydrofera Blue. He has had an ablation by vein and vascular. Felt to have adequate arterial supply  to this area. The patient got prednisone last week from Dr. Angelique Holm of nephrology. At my urging he actually took it. The area on his left toe was a lot better. The right toe was not as painful but still erythematous with a wound at the tip. We have been using silver alginate here Electronic Signature(s) Signed: 11/05/2019 6:16:05 PM By: [name] MD Entered By: [name] on 11/05/2019 11:19:07 -------------------------------------------------------------------------------- Physical Exam Details Patient Name: Date of Service: Michael, [name] 11/05/2019 9:30 AM Medical Record [id number] Patient Account Number: [id number] Date of Birth/Sex: Treating RN: 1946-06-07 (74 y.o. [name] Primary Care Provider: [name] Other Clinician: Referring Provider: Treating Provider/Extender:Delanee Xin, [name], [name] in Treatment: 13 Constitutional Sitting or standing Blood Pressure is within target range for patient.. Pulse regular and within target range for patient.Marland Kitchen Respirations regular, non-labored and within target range.. Temperature is normal and within the target range for the patient.Marland Kitchen Appears in no distress. Cardiovascular We have much better edema control on the right. Notes Wound exam The right fifth toe is still very  erythematous but not nearly as tender. No tenderness over either the PIP or the DIP joints he still has a small wound at the tip.Marland Kitchen His major wound on the right posterior calf is coming down. There are rims of epithelialization. This is a very large wound with some areas of hyper granulation but I did not think that debridement was necessary. Electronic Signature(s) Signed: 11/05/2019 6:16:05 PM By: [name] MD Entered By: [name] on 11/05/2019 11:20:48 -------------------------------------------------------------------------------- Physician Orders Details Patient Name: Date of Service: Michael, [name] 11/05/2019 9:30 AM Medical Record [id number] Patient Account Number: [id number] Date of Birth/Sex: Treating RN: 1946/07/30 (73 y.o. [name] Primary Care Provider: [name] Other Clinician: Referring Provider: Treating Provider/Extender:Retal Tonkinson, [name], [name] in Treatment: 13 Verbal / Phone Orders: No Diagnosis Coding ICD-10 Coding Code Description S80.11XD Contusion of right lower leg, subsequent encounter L97.212 Non-pressure chronic ulcer of right calf with fat layer exposed I87.321 Chronic venous hypertension (idiopathic) with inflammation of right lower extremity E11.622 Type 2 diabetes mellitus with other skin ulcer L97.518 Non-pressure chronic ulcer of other part of right foot with other specified severity Follow-up Appointments Return Appointment in 1 week. Dressing Change Frequency Change dressing three times week. - all wounds - 2x by home health on Wednesdays and Fridays, 1x by wound clinic on Mondays Skin Barriers/Peri-Wound Care Barrier cream Moisturizing lotion Wound Cleansing Clean wound with Normal Saline. - or normal saline on days that dressing is changed May shower with protection. Primary Wound Dressing Wound #3 Right,Posterior Lower Leg Hydrofera Blue - Classic Wound #5 Right Toe Fifth Calcium  Alginate with Silver Wound #6 Right,Medial Lower Leg Calcium Alginate with Silver - any weeping/blistered areas Wound #7 Right,Distal,Posterior Lower Leg Calcium Alginate with Silver - any weeping/blistered areas Secondary Dressing Wound #3 Right,Posterior Lower Leg Foam - foam top bend of foot for protection ABD pad Zetuvit or Kerramax - or Xtrasorb Wound #5 Right Toe Fifth Kerlix/Rolled Gauze Dry Gauze Wound #6 Right,Medial Lower Leg ABD pad Zetuvit or Kerramax - or Xtrasorb Wound #7 Right,Distal,Posterior Lower Leg ABD pad Zetuvit or Kerramax - or Xtrasorb Edema Control Unna Boot to Right Lower Extremity Avoid standing for long periods of time Elevate legs to the level of the heart or above for 30 minutes daily and/or when sitting, a frequency of: - throughout the day Exercise regularly Additional Orders / Instructions Other: - Go to ER if  toe/leg gets more red/black, increased pain, fever Other: - Make follow up appt with Kidney doctor regarding uncontrolled swelling [facility] for wound care. - [facility] X-ray, right fifth toe - Non healing ulcer on fifth toe - (ICD10 E11.622 - Type 2 diabetes mellitus with other skin ulcer) Electronic Signature(s) Signed: 11/05/2019 6:14:31 PM By: [name] RN, BSN Signed: 11/05/2019 6:16:05 PM By: [name] MD Entered By: [name] on 11/05/2019 10:55:10 -------------------------------------------------------------------------------- Prescription 11/05/2019 Patient Name: [name] Provider: [name] MD Date of Birth: May 27, 1946 NPI#: [id number] Sex: M DEA#: [id number] Phone #: [phone] License #: [id number] Patient Address: [place] [address] [place], [place] [address] [place], [place] [address] [phone] Allergies Elavil Reaction: hallucination Severity:  Moderate Provider's Orders X-ray, right fifth toe - ICD10: E11.622 - Non healing ulcer on fifth toe Signature(s): Date(s): Electronic Signature(s) Signed: 11/05/2019 6:14:31 PM By: [name] RN, BSN Signed: 11/05/2019 6:16:05 PM By: [name] MD Entered By: [name] on 11/05/2019 10:55:11 --------------------------------------------------------------------------------  Problem List Details Patient Name: Date of Service: Michael, [name] 11/05/2019 9:30 AM Medical Record [id number] Patient Account Number: [id number] Date of Birth/Sex: Treating RN: Jan 07, 1946 (73 y.o. Jonette Eva, Briant Cedar Primary Care Provider: [name] Other Clinician: Referring Provider: Treating Provider/Extender:Ardie Mclennan, [name], [name] in Treatment: 13 Active Problems ICD-10 Evaluated Encounter Code Description Active Date Today Diagnosis S80.11XD Contusion of right lower leg, subsequent encounter 08/06/2019 No Yes L97.212 Non-pressure chronic ulcer of right calf with fat layer 08/06/2019 No Yes exposed I87.321 Chronic venous hypertension (idiopathic) with 08/06/2019 No Yes inflammation of right lower extremity E11.622 Type 2 diabetes mellitus with other skin ulcer 08/06/2019 No Yes L97.518 Non-pressure chronic ulcer of other part of right foot 10/01/2019 No Yes with other specified severity Inactive Problems Resolved Problems Electronic Signature(s) Signed: 11/05/2019 6:16:05 PM By: [name] MD Entered By: [name] on 11/05/2019 11:17:19 -------------------------------------------------------------------------------- Progress Note Details Patient Name: Date of Service: Michael, [name] 11/05/2019 9:30 AM Medical Record [id number] Patient Account Number: [id number] Date of Birth/Sex: Treating RN: [birth year] (74 y.o. [name] Primary Care Provider: [name] Other Clinician: Referring Provider: Treating  Provider/Extender:Jacobey Gura, [name], [name] in Treatment: 13 Subjective History of Present Illness (HPI) [place] HPI Description: 74 year old gentleman who was recently seen by his nephrologist [name], and noted to have a wound on his left lower extremity which was lacerated 2 months ago and now has reopened. The patient's left shin has a ulceration with some exudate but no evidence of infection and he was referred to [place] for further care as it was known that the patient has had some peripheral vascular disease in the past. Past medical history significant for chronic kidney disease, atrial fibrillation, diabetes mellitus,status post kidney transplant in 1983 and 2005, a week fistula graft placement, status post previous bowel surgery. 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 10/06/2015 -- x-ray of the left tibia and fibula shows no evidence of osteomyelitis. The patient has also had Doppler studies of his extremity and is awaiting the appointment with the vascular surgeon. We have not yet received these reports. 10/13/2015 -- lower extremity venous duplex reflux evaluation shows reflux in the left common femoral vein, left saphenofemoral junction and the proximal greater saphenous vein extending to the proximal calf. There is also reflux in the left proximal to mid  small saphenous vein. Arterial duplex studies done showed the resting ABI was not applicable due to tibial artery medial calcification. 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 The right TBI was 1 which is normal and the left ABI was 1 which is normal. Patient has otherwise been doing fine and has been compliant with his dressings. 10/20/2015 -- He was seen by [name] recently for a vascular opinion on 10/15/2015. His left lower  extremity venous insufficiency duplex study revealed GSV reflux,SS vein reflux and deep venous reflux in the common femoral vein. His ABIs were non compressible and his TBI on the right was 1.01 and on the left was 0.80. He was asked to continue with the wound care with compressive therapy followed by EVLA of the left GS vein 3 months. He recommended 20-30 mm thigh-high compression stockings and the need for a three-month trial of this. The patient had an Unna boot applied at the vascular office but he could not tolerate this with a lot of pain and issues with his toes and hence came here on Friday for removal of this and we reapplied a 2 layer compression. 11/10/2015 -- patient still has not purchased his 20-30 mm thigh-high compression stockings as prescribed by [name]. Readmission: 08/08/18 on evaluation today patient presents for readmission concerning a new injury to the left anterior lower extremity. He was previously seen in 2017 here in our clinic. He states that he has done fairly well since that point. Nonetheless he is having at this time some pain but states that he hit this on a table that fell over and actually struck his leg. This appears to have pulled back some of his skin which folded in on itself and is causing some difficulty as far as that is concerned. There does not appear to be any evidence of infection at this time. No fevers, chills, nausea, or vomiting noted at this time. He's been using dressings on his own currently without complication. 08/15/18 on evaluation today patient actually appears to be doing somewhat better in regard to his wound of the lower [name] when compared to the first visit last week. I had to do a much more extensive debridement at that time it does appear that I'm gonna have to perform some debridement today but it does not look to be as extensive by any means. Nonetheless fortunately he does not show any signs of infection he does have  discomfort at this site. I believe based on what I'm seeing currently he may benefit from Iodoflex to help keep the wound bed clean. Patient tolerated therapy without complication. Upon evaluation today the patient actually appears to be doing excellent in regard to his left lower extremity ulcer. This is much better than the previous two visits where he had a lot of necrotic tissue around the edge of the wound simply due to the fact that again there was a significant skin tear where the edge had been cleared away prior to reattaching and being able to heal appropriately. He seems to be doing much better at this point. 08/28/18 on evaluation today the patient's wound actually does appear to be showing signs of improvement. With that being said though he is improving he would likely note even greater improvement if we were able to sharply debride the wound. Nonetheless this caused him to much discomfort he tells me. 09/04/18 on  evaluation today patient actually appears to be showing signs of improvement in regard to his left lower extremity ulcer. He has been tolerating the dressing changes including the wrap although he tells me at this point that the burning does last for a couple of days even with just the Iodoflex. I was afraid that this may been part of the issue that he was having with discomfort. It does seem to be the case. Nonetheless he shows no signs of evidence of infection at this time which is good news. No fevers chills noted ADMISSION to [name] 10/05/2018 This is a patient who was cared for in 2017 and again in the fall of this year at our sister clinic and [place]. He actually lives in [place] in [place]. We have been dealing with an apparent traumatic area on the left anterior tibial area. This is been present for the last several months. He was supposed to be using Iodoflex Kerlix and Coban however he was hospitalized from 09/05/2018 through 09/11/2018  with delirium secondary to pneumonia. Since then he is only been putting Vaseline gauze on this without compression. He also has a more recent skin tear on the dorsal right hand that may have only happened in the last week. The patient had arterial studies done in 2017 in January which was 3 years ago. At that point he had noncompressible ABIs but really quite good TBI's both normal. Triphasic waveforms on the right monophasic at the left posterior tibial but triphasic at the left dorsalis pedis. His ABIs in our clinic today were both noncompressible 1/23; the patient has wounds on the right dorsal hand just distal to the wrist and on the left anterior lower extremity. Both of these look very healthy he is using Hydrofera Blue 1/30; left anterior lower extremity wound much smaller. Healthy looking surface. The laceration area just distal to the wrist on the dorsal hand on the right is also just about closed I used Hydrofera Blue here 2/6; left anterior lower extremity wound is much smaller but still open. The laceration area just distal to the wrist on the dorsal hand is fully epithelialized. 2/13; the patient's anterior lower extremity wound is closed. The laceration just distal to the wrist on the dorsal hand is also fully epithelialized and closed. The patient has external compression stockings which I think are 20/30 READMISSION 08/06/2019 [name] is a 74 year old man with had several times previously in our clinic. He is a diabetic with a history of chronic renal insufficiency status post kidney transplant in 1983 and again in 2005. He was then in 2017 with a laceration on the left lower extremity. He was worked up at the time with arterial studies and reflux studies. The arterial studies showed ABIs to be noncompressible but TBI's were within normal limits. I do not have the reflux studies at the moment. He was also sent here in 2019 with a left lower extremity wound and then again in  2020 with left lower extremity trauma a skin tear on the wrist. He was discharged with 20/30 stockings identified from myself that that might not be enough compression. Nevertheless he states he was wearing these fairly reliably. In September he had a fall with a substantial bruise in the area of the wound. He says he saw orthopedics and they told him there was some muscle strain sometime it later this opened into a wound. He has a fairly substantial wound on the right posterior calf. Satellite areas around  this including medially and posteriorly. He has not worn his stockings since the injury Past medical history; includes chronic renal failure secondary to diabetes with kidney transplant x2, atrial fibrillation, heart failure with preserved ejection fraction, coronary artery disease. ABIs on the right in our clinic were once again noncompressible 08/13/2019 on evaluation today patient appears to be doing decently well with regard to his wound compared to last week's evaluation. Unfortunately he is still having a lot of discomfort at this point which is I think in some part due to the 3 layer compression wrap which is a little bit stronger I think for him. When he was here before we actually utilized a Kerlix and Coban wrap which he states seemed to got a little bit better. Nonetheless I think we can probably drop back to this in light of the discomfort that he had. Nonetheless the pain was not really right around the wound itself as much as it was around the ankle in particular. The Iodoflex does seem to have done well for him As the wound is appearing somewhat better today which is excellent news. 11/30; still complaining of a lot of pain. Apparently arterial studies I ordered 2 weeks ago are below. I do not believe we have an appointment with vein and vascular as of yet; ABI Findings: +---------+------------------+-----+----------+--------+ Right Rt Pressure (mmHg)IndexWaveform Comment   +---------+------------------+-----+----------+--------+ PTA >254 1.50 monophasic  +---------+------------------+-----+----------+--------+ DP >254 1.50 monophasic  +---------+------------------+-----+----------+--------+ Great Toe68 0.40 Abnormal   +---------+------------------+-----+----------+--------+ +---------+------------------+-----+----------+-------+ Left Lt Pressure (mmHg)IndexWaveform Comment +---------+------------------+-----+----------+-------+ Brachial 169     +---------+------------------+-----+----------+-------+ PTA >254 1.50 monophasic  +---------+------------------+-----+----------+-------+ DP >254 1.50 monophasic  +---------+------------------+-----+----------+-------+ Great Toe40 0.24 Abnormal   +---------+------------------+-----+----------+-------+ Pedal arteries appear hyperemic. Patient refused Brachial pressure in the right arm. Summary: Right: Resting right ankle-brachial index indicates noncompressible right lower extremity arteries. The right toe- brachial index is abnormal. Left: Resting left ankle-brachial index indicates noncompressible left lower extremity arteries. The left toe-brachial index is abnormal. He is also having considerably more swelling in his left calf. This was not there when I last saw him 2 weeks ago. He tells me that some of the home health compression wraps have been slipping down and that may be the issue here however a month I am uncertain 62/7; sees vascular on December 22. Still complaining of a lot of pain. DVT study I did last time was negative for DVT 12/14; still complaining of pain which if this is arterial is certainly claudication and rest keeps him uncomfortable at night. He has an appointment with vein and vascular on December 22. Wound surface is better using Iodoflex. Once the surface of this is satisfactory and we have exhausted the vascular route. Perhaps an  advanced treatment option. He has a configuration of the venous ulceration although his arterial studies are not very good. The other issue is the patient has a transplanted kidney. This will make angiography difficult and challenging issue 12/21; still complaining of pain and drainage. We are using Iodoflex on the wound under compression. He sees Dr. Donnetta Hutching tomorrow to evaluate his noninvasive studies noted above. He has a transplanted kidney further complicating the options for angiography. 12/28; still using Iodoflex under compression. I have Dr. Luther Parody note from 12/22. he noted arterial studies revealing monophasic waveforms at the pedal vessels bilaterally and calcified vessels making the ABI unreliable. He did not comment on the reduced TBI's. He felt these were venous wounds based on the palpable dorsalis pedis pulse. 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  And they arranged for formal venous duplex with reflux studies in the next several weeks. Follow-up with either Scot Dock or Dr. Oneida Alar 09/24/2019 still using Iodoflex under compression. He has an appointment with Dr. Doren Custard on 1/7 with regards to his venous disease. The patient was not felt to have a primary arterial problem for the nonhealing of his wound. We did attempt to wrap him with 3 layer when he first came into the clinic he complained of a lot of pain in the ankle although this may have been because the dressing fell down somewhat. He has far too edema fluid in the right leg for a good prognosis about healing this wound He comes in today with an excoriation on the bottom part of his right fifth toe. He thinks he may have done this taking off his clothes and that something got caught on the toe. There is no open wound however the toe itself is very painful 1/11; we are using Iodoflex under compression. The wound bed is clean. He went on to see Dr. Doren Custard on 09/27/2019 he again feels that the patient's arterial supply is  adequate. He feels that he might benefit from right greater saphenous vein ablation for a venous ulcer. In the meantime the area that he was complaining about last week on the right fifth toe. An x-ray that I ordered showed marked soft tissue swelling along the distal aspect of the right foot but there was no evidence of osteomyelitis. He comes in today with the fifth toenail just about coming off. He has black eschar underneath the toe on the plantar aspect. The toe was swollen red and very painful. In the right setting this could be a significant soft tissue infection versus ischemia of the toe itself. He did not show this to Dr. Doren Custard 1/18; I am using Iodoflex under compression a large wound on the right posterior calf. He had his right greater saphenous vein ablation by Dr. Doren Custard although I am not sure that is the only problem here. ooThe right fifth toe which was possibly trauma 2 weeks ago continues to be exceptionally painful with a necrotic tip. Maybe not quite as swollen. I started him empirically on Augmentin 5/125 1 p.o. twice daily last week after discussing this with Dr. Loletha Grayer of nephrology. Perhaps somewhat better this week but not as good as I was hoping. A plain x-ray was negative. He comes in today with an area on the medial right calf that was blistered and now open. In looking at things he appears to be systemically fluid volume overloaded. He has a transplanted kidney. He states he takes his Lasix variably when he has appointments he tries not to take it the later I am really not certain if he takes this reliably. However he has far too much edema fluid in the right leg to easily heal this wound and he appears to be developing blisters medially to form additional wounds 1/25; his CO2 angiogram was done by Dr. Doren Custard last week. This showed the proximal arteries all to be patent. On the right side the common femoral deep femoral superficial femoral popliteal anterior tibial and  peroneal arteries were patent. The posterior tibial was occluded but reconstituted distally via collaterals from the peroneal artery he was felt he should have enough blood flow to heal his wounds including the right fifth toe. The right fifth toe looks better however he is still complaining of a lot of pain. The large area which is a venous ulceration posteriorly  has a better looking surface I think we can switch to [facility] today 2/1; the patient's original wounds on the right posterior medial calf has come down in width however superior to this he has new denuded areas and I am concerned we simply do not have enough edema control. He has already undergone a right greater saphenous ablation. He had a CO2 angiogram done by Dr. Doren Custard and the comment was that we should have enough blood flow to heal the venous wound however we simply do not seem to have enough edema control with 3 layer compression. The patient is status post kidney transplant although his exact renal function is not really clear. Nor am i sure what dose of diuretic he is supposed to be on. He also has the area on the right fifth toe which was unclear etiology but I think became secondarily infected I gave him 2 weeks of antibiotics for this and this seems to have settled down he still has a black eschar over the tip of the toe. X-ray was negative for fracture. He says he has a history of gout. 2/8; the patient's wound on the right posterior calf is about the same. The superficial area medially also about the same. He got a prescription for prednisoneo Gout after he developed erythema on the dorsal aspect of his left great toe going along with the right fifth toe which has been problematic all along. He has not taken it because he is concerned about increasing CBGs. He has a transplanted kidney is already on prednisone 5 mg. He would not be a good candidate for NSAIDs. Perhaps colchicine. He is not aware of what his uric acid  level is 2/15; his right posterior calf wound seems to be coming in in terms of width. Everything here looks fairly good. No mechanical debridement we have been using Hydrofera Blue. He has had an ablation by vein and vascular. Felt to have adequate arterial supply to this area. The patient got prednisone last week from Dr. Angelique Holm of nephrology. At my urging he actually took it. The area on his left toe was a lot better. The right toe was not as painful but still erythematous with a wound at the tip. We have been using silver alginate here Objective Constitutional Sitting or standing Blood Pressure is within target range for patient.. Pulse regular and within target range for patient.Marland Kitchen Respirations regular, non-labored and within target range.. Temperature is normal and within the target range for the patient.Marland Kitchen Appears in no distress. Vitals Time Taken: 10:14 AM, Height: 67 in, Weight: 190 lbs, BMI: 29.8, Temperature: 98.3 F, Pulse: 53 bpm, Respiratory Rate: 20 breaths/min, Blood Pressure: 144/62 mmHg, Capillary Blood Glucose: 69 mg/dl. General Notes: per patient ate after checking blood glucose. Cardiovascular We have much better edema control on the right. General Notes: Wound exam ooThe right fifth toe is still very erythematous but not nearly as tender. No tenderness over either the PIP or the DIP joints he still has a small wound at the tip.. ooHis major wound on the right posterior calf is coming down. There are rims of epithelialization. This is a very large wound with some areas of hyper granulation but I did not think that debridement was necessary. Integumentary (Hair, Skin) Wound #3 status is Open. Original cause of wound was Trauma. The wound is located on the Right,Posterior Lower Leg. The wound measures 16.3cm length x 5cm width x 0.3cm depth; 64.01cm^2 area and 19.203cm^3 volume. There is Fat Layer (Subcutaneous  Tissue) Exposed exposed. There is no tunneling or  undermining noted. There is a large amount of serosanguineous drainage noted. The wound margin is thickened. There is large (67-100%) pink granulation within the wound bed. There is no necrotic tissue within the wound bed. Wound #5 status is Open. Original cause of wound was Blister. The wound is located on the Right Toe Fifth. The wound measures 0.6cm length x 0.9cm width x 0.3cm depth; 0.424cm^2 area and 0.127cm^3 volume. There is no tunneling or undermining noted. There is a small amount of serous drainage noted. The wound margin is distinct with the outline attached to the wound base. There is no granulation within the wound bed. There is a large (67- 100%) amount of necrotic tissue within the wound bed including Eschar and Adherent Slough. Wound #6 status is Open. Original cause of wound was Blister. The wound is located on the Right,Medial Lower Leg. The wound measures 3cm length x 1cm width x 0.1cm depth; 2.356cm^2 area and 0.236cm^3 volume. There is Fat Layer (Subcutaneous Tissue) Exposed exposed. There is no tunneling or undermining noted. There is a medium amount of serous drainage noted. The wound margin is distinct with the outline attached to the wound base. There is large (67-100%) pink granulation within the wound bed. There is a small (1-33%) amount of necrotic tissue within the wound bed including Adherent Slough. Wound #7 status is Open. Original cause of wound was Blister. The wound is located on the Right,Distal,Posterior Lower Leg. The wound measures 1cm length x 1cm width x 0.1cm depth; 0.785cm^2 area and 0.079cm^3 volume. There is Fat Layer (Subcutaneous Tissue) Exposed exposed. There is no tunneling or undermining noted. There is a medium amount of serous drainage noted. The wound margin is distinct with the outline attached to the wound base. There is large (67-100%) pink granulation within the wound bed. There is no necrotic tissue within the wound bed. Wound #8 status  is Healed - Epithelialized. Original cause of wound was Blister. The wound is located on the Right,Lateral Lower Leg. The wound measures 0cm length x 0cm width x 0cm depth; 0cm^2 area and 0cm^3 volume. 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 There was a Haematologist Compression Therapy Procedure by [name], RN. Post procedure Diagnosis Wound #3: Same as Pre-Procedure Wound #6 Pre-procedure diagnosis of Wound #6 is a Venous Leg Ulcer located on the Right,Medial Lower Leg . There was a Haematologist Compression Therapy Procedure by [name], RN. Post procedure Diagnosis Wound #6: Same as Pre-Procedure Wound #7 Pre-procedure diagnosis of Wound #7 is a Venous Leg Ulcer located on the Right,Distal,Posterior Lower Leg . There was a Haematologist Compression Therapy Procedure by [name], RN. Post procedure Diagnosis Wound #7: Same as Pre-Procedure Plan Follow-up Appointments: Return Appointment in 1 week. Dressing Change Frequency: Change dressing three times week. - all wounds - 2x by home health on Wednesdays and Fridays, 1x by wound clinic on Mondays Skin Barriers/Peri-Wound Care: Barrier cream Moisturizing lotion Wound Cleansing: Clean wound with Normal Saline. - or normal saline on days that dressing is changed May shower with protection. Primary Wound  Dressing: Wound #3 Right,Posterior Lower Leg: Hydrofera Blue - Classic Wound #5 Right Toe Fifth: Calcium Alginate with Silver Wound #6 Right,Medial Lower Leg: Calcium Alginate with  Silver - any weeping/blistered areas Wound #7 Right,Distal,Posterior Lower Leg: Calcium Alginate with Silver - any weeping/blistered areas Secondary Dressing: Wound #3 Right,Posterior Lower Leg: Foam - foam top bend of foot for protection ABD pad Zetuvit or Kerramax - or Xtrasorb Wound #5 Right Toe Fifth: Kerlix/Rolled Gauze Dry Gauze Wound #6 Right,Medial Lower Leg: ABD pad Zetuvit or Kerramax - or Xtrasorb Wound #7 Right,Distal,Posterior Lower Leg: ABD pad Zetuvit or Kerramax - or Xtrasorb Edema Control: Unna Boot to Right Lower Extremity Avoid standing for long periods of time Elevate legs to the level of the heart or above for 30 minutes daily and/or when sitting, a frequency of: - throughout the day Exercise regularly Additional Orders / Instructions: Other: - Go to ER if toe/leg gets more red/black, increased pain, fever Other: - Make follow up appt with Kidney doctor regarding uncontrolled swelling Home Health: [facility] for wound care. - [facility] ordered were: X-ray, right fifth toe - Non healing ulcer on fifth toe 1. Silver alginate to the fifth toe 2. Operating under the assumption that at least some of this was acute gout but I still cannot get over the degree of erythema. The degree of erythema on the dorsal left toe however is a lot better. I am going to rex-ray this toe just to make sure were not showing more obvious evidence of infection or trauma than what we saw a month ago on a plain film 3. He has a transplanted kidney. Not a candidate for NSAIDs. I think colchicine might be option Electronic Signature(s) Signed: 11/05/2019 6:14:31 PM By: [name] RN, BSN Signed: 11/05/2019 6:16:05 PM By: [name] MD Entered By: [name] on 11/05/2019 14:08:08 -------------------------------------------------------------------------------- SuperBill Details Patient Name: Date of Service: Michael, [name]  11/05/2019 Medical Record [id number] Patient Account Number: [id number] Date of Birth/Sex: Treating RN: December 08, 1945 (74 y.o. 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 Facility Procedures CPT4 Code Description: [id number] (Facility Use Only) [phone] - APPLY Louretta Parma BOOT RT Modifier: Quantity: 1 Physician Procedures CPT4 Code Description: [id number] - WC PHYS LEVEL 3 - EST PT ICD-10 Diagnosis Description L97.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

## 2019-11-07 ENCOUNTER — Ambulatory Visit
Admission: RE | Admit: 2019-11-07 | Discharge: 2019-11-07 | Disposition: A | Discharge status: Home / Self Care | Payer: Medicare Other | Source: Ambulatory Visit | Attending: Internal Medicine | Admitting: Internal Medicine

## 2019-11-07 ENCOUNTER — Other Ambulatory Visit: Payer: Self-pay

## 2019-11-07 DIAGNOSIS — E11622 Type 2 diabetes mellitus with other skin ulcer: Secondary | ICD-10-CM

## 2019-11-07 DIAGNOSIS — L98499 Non-pressure chronic ulcer of skin of other sites with unspecified severity: Secondary | ICD-10-CM

## 2019-11-12 ENCOUNTER — Encounter (HOSPITAL_BASED_OUTPATIENT_CLINIC_OR_DEPARTMENT_OTHER): Payer: Medicare Other | Admitting: Internal Medicine

## 2019-11-12 ENCOUNTER — Other Ambulatory Visit: Payer: Self-pay

## 2019-11-12 DIAGNOSIS — S8011XD Contusion of right lower leg, subsequent encounter: Secondary | ICD-10-CM | POA: Diagnosis not present

## 2019-11-15 NOTE — Progress Notes (Signed)
MAN, EFFERTZ ([id number]) Visit Report for 11/12/2019 HPI Details Patient Name: Date of Service: [name], [name] 11/12/2019 2:00 PM Medical Record [id number] Patient Account Number: [id number] Date of Birth/Sex: Treating RN: 23-Mar-1946 (74 y.o. [name] Primary Care Provider: [name] Other Clinician: Referring Provider: Treating Provider/Extender:Raeanne Deschler, [name], [name] in Treatment: 14 History of Present Illness HPI Description: [name] HPI Description: 74 year old gentleman who was recently seen by his nephrologist [name], and noted to have a wound on his left lower extremity which was lacerated 2 months ago and now has reopened. The patient's left shin has a ulceration with some exudate but no evidence of infection and he was referred to [place] for further care as it was known that the patient has had some peripheral vascular disease in the past. Past medical history significant for chronic kidney disease, atrial fibrillation, diabetes mellitus,status post kidney transplant in 1983 and 2005, a week fistula graft placement, status post previous bowel surgery. 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 10/06/2015 -- x-ray of the left tibia and fibula shows no evidence of osteomyelitis. The patient has also had Doppler studies of his extremity and is awaiting the appointment with the vascular surgeon. We have not yet received these reports. 10/13/2015 -- lower extremity venous duplex reflux evaluation shows reflux in the left common femoral vein, left saphenofemoral junction and the proximal greater saphenous vein extending to the proximal calf. There is also reflux in the left proximal to mid small saphenous vein. Arterial duplex studies done showed the resting ABI was not applicable due to tibial artery medial calcification. 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 The right TBI was 1 which is normal and the left ABI was 1 which is normal. Patient has otherwise been doing fine and has been compliant with his dressings. 10/20/2015 -- He was seen by [name] recently for a vascular opinion on 10/15/2015. His left lower extremity venous insufficiency duplex study revealed GSV reflux,SS vein reflux and deep venous reflux in the common femoral vein. His ABIs were non compressible and his TBI on the right was 1.01 and on the left was 0.80. He was asked to continue with the wound care with compressive therapy followed by EVLA of the left GS vein 3 months. He recommended 20-30 mm thigh-high compression stockings and the need for a three-month trial of this. The patient had an Unna boot applied at the vascular office but he could not tolerate this with a lot of pain and issues with his toes and hence came here on Friday for removal of this and we reapplied a 2 layer compression. 11/10/2015 -- patient still has not purchased his 20-30 mm thigh-high compression stockings as prescribed by [name]. Readmission: 08/08/18 on evaluation today patient presents for readmission concerning a new injury to the left anterior lower extremity. He was previously seen in 2017 here in our clinic. He states that he has done fairly well since that point. Nonetheless he is having at this time some pain but states that he hit this on a table that fell over and actually struck his leg. This appears to have pulled back some of his skin which folded in on itself and is causing some difficulty as far as that is concerned. There does not appear to be any evidence of  infection at this time. No fevers, chills, nausea, or vomiting noted at this time. He's been using dressings on his own currently without complication. 08/15/18 on evaluation today patient actually appears to be  doing somewhat better in regard to his wound of the lower [name] when compared to the first visit last week. I had to do a much more extensive debridement at that time it does appear that I'm gonna have to perform some debridement today but it does not look to be as extensive by any means. Nonetheless fortunately he does not show any signs of infection he does have discomfort at this site. I believe based on what I'm seeing currently he may benefit from Iodoflex to help keep the wound bed clean. Patient tolerated therapy without complication. Upon evaluation today the patient actually appears to be doing excellent in regard to his left lower extremity ulcer. This is much better than the previous two visits where he had a lot of necrotic tissue around the edge of the wound simply due to the fact that again there was a significant skin tear where the edge had been cleared away prior to reattaching and being able to heal appropriately. He seems to be doing much better at this point. 08/28/18 on evaluation today the patient's wound actually does appear to be showing signs of improvement. With that being said though he is improving he would likely note even greater improvement if we were able to sharply debride the wound. Nonetheless this caused him to much discomfort he tells me. 09/04/18 on evaluation today patient actually appears to be showing signs of improvement in regard to his left lower extremity ulcer. He has been tolerating the dressing changes including the wrap although he tells me at this point that the burning does last for a couple of days even with just the Iodoflex. I was afraid that this may been part of the issue that he was having with discomfort. It does seem to be the case. Nonetheless he shows no signs of evidence of infection at this time which is good news. No fevers chills noted ADMISSION to [name] 10/05/2018 This is a patient who was cared for in 2017  and again in the fall of this year at our sister clinic and [place]. He actually lives in [place] in [place]. We have been dealing with an apparent traumatic area on the left anterior tibial area. This is been present for the last several months. He was supposed to be using Iodoflex Kerlix and Coban however he was hospitalized from 09/05/2018 through 09/11/2018 with delirium secondary to pneumonia. Since then he is only been putting Vaseline gauze on this without compression. He also has a more recent skin tear on the dorsal right hand that may have only happened in the last week. The patient had arterial studies done in 2017 in January which was 3 years ago. At that point he had noncompressible ABIs but really quite good TBI's both normal. Triphasic waveforms on the right monophasic at the left posterior tibial but triphasic at the left dorsalis pedis. His ABIs in our clinic today were both noncompressible 1/23; the patient has wounds on the right dorsal hand just distal to the wrist and on the left anterior lower extremity. Both of these look very healthy he is using Hydrofera Blue 1/30; left anterior lower extremity wound much smaller. Healthy looking surface. The laceration area just distal to the wrist on the dorsal hand on the right is also just  about closed I used Hydrofera Blue here 2/6; left anterior lower extremity wound is much smaller but still open. The laceration area just distal to the wrist on the dorsal hand is fully epithelialized. 2/13; the patient's anterior lower extremity wound is closed. The laceration just distal to the wrist on the dorsal hand is also fully epithelialized and closed. The patient has external compression stockings which I think are 20/30 READMISSION 08/06/2019 Mr. Grau is a 74 year old man with had several times previously in our clinic. He is a diabetic with a history of chronic renal insufficiency status post kidney transplant in 1983 and again  in 2005. He was then in 2017 with a laceration on the left lower extremity. He was worked up at the time with arterial studies and reflux studies. The arterial studies showed ABIs to be noncompressible but TBI's were within normal limits. I do not have the reflux studies at the moment. He was also sent here in 2019 with a left lower extremity wound and then again in 2020 with left lower extremity trauma a skin tear on the wrist. He was discharged with 20/30 stockings identified from myself that that might not be enough compression. Nevertheless he states he was wearing these fairly reliably. In September he had a fall with a substantial bruise in the area of the wound. He says he saw orthopedics and they told him there was some muscle strain sometime it later this opened into a wound. He has a fairly substantial wound on the right posterior calf. Satellite areas around this including medially and posteriorly. He has not worn his stockings since the injury Past medical history; includes chronic renal failure secondary to diabetes with kidney transplant x2, atrial fibrillation, heart failure with preserved ejection fraction, coronary artery disease. ABIs on the right in our clinic were once again noncompressible 08/13/2019 on evaluation today patient appears to be doing decently well with regard to his wound compared to last week's evaluation. Unfortunately he is still having a lot of discomfort at this point which is I think in some part due to the 3 layer compression wrap which is a little bit stronger I think for him. When he was here before we actually utilized a Kerlix and Coban wrap which he states seemed to got a little bit better. Nonetheless I think we can probably drop back to this in light of the discomfort that he had. Nonetheless the pain was not really right around the wound itself as much as it was around the ankle in particular. The Iodoflex does seem to have done well for him As the  wound is appearing somewhat better today which is excellent news. 11/30; still complaining of a lot of pain. Apparently arterial studies I ordered 2 weeks ago are below. I do not believe we have an appointment with vein and vascular as of yet; ABI Findings: +---------+------------------+-----+----------+--------+ Right Rt Pressure (mmHg)IndexWaveform Comment  +---------+------------------+-----+----------+--------+ PTA >254 1.50 monophasic  +---------+------------------+-----+----------+--------+ DP >254 1.50 monophasic  +---------+------------------+-----+----------+--------+ Great Toe68 0.40 Abnormal   +---------+------------------+-----+----------+--------+ +---------+------------------+-----+----------+-------+ Left Lt Pressure (mmHg)IndexWaveform Comment +---------+------------------+-----+----------+-------+ Brachial 169     +---------+------------------+-----+----------+-------+ PTA >254 1.50 monophasic  +---------+------------------+-----+----------+-------+ DP >254 1.50 monophasic  +---------+------------------+-----+----------+-------+ Great Toe40 0.24 Abnormal   +---------+------------------+-----+----------+-------+ Pedal arteries appear hyperemic. Patient refused Brachial pressure in the right arm. Summary: Right: Resting right ankle-brachial index indicates noncompressible right lower extremity arteries. The right toe- brachial index is abnormal. Left: Resting left ankle-brachial index indicates noncompressible left lower extremity arteries. The left toe-brachial index is abnormal.  He is also having considerably more swelling in his left calf. This was not there when I last saw him 2 weeks ago. He tells me that some of the home health compression wraps have been slipping down and that may be the issue here however a month I am uncertain 18/8; sees vascular on December 22. Still complaining of a lot of pain. DVT study  I did last time was negative for DVT 12/14; still complaining of pain which if this is arterial is certainly claudication and rest keeps him uncomfortable at night. He has an appointment with vein and vascular on December 22. Wound surface is better using Iodoflex. Once the surface of this is satisfactory and we have exhausted the vascular route. Perhaps an advanced treatment option. He has a configuration of the venous ulceration although his arterial studies are not very good. The other issue is the patient has a transplanted kidney. This will make angiography difficult and challenging issue 12/21; still complaining of pain and drainage. We are using Iodoflex on the wound under compression. He sees Dr. Donnetta Hutching tomorrow to evaluate his noninvasive studies noted above. He has a transplanted kidney further complicating the options for angiography. 12/28; still using Iodoflex under compression. I have Dr. Luther Parody note from 12/22. he noted arterial studies revealing monophasic waveforms at the pedal vessels bilaterally and calcified vessels making the ABI unreliable. He did not comment on the reduced TBI's. He felt these were venous wounds based on the palpable dorsalis pedis pulse. 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 And they arranged for formal venous duplex with reflux studies in the next several weeks. Follow-up with either Scot Dock or Dr. Oneida Alar 09/24/2019 still using Iodoflex under compression. He has an appointment with Dr. Doren Custard on 1/7 with regards to his venous disease. The patient was not felt to have a primary arterial problem for the nonhealing of his wound. We did attempt to wrap him with 3 layer when he first came into the clinic he complained of a lot of pain in the ankle although this may have been because the dressing fell down somewhat. He has far too edema fluid in the right leg for a good prognosis about healing this wound He comes in today with an excoriation on the  bottom part of his right fifth toe. He thinks he may have done this taking off his clothes and that something got caught on the toe. There is no open wound however the toe itself is very painful 1/11; we are using Iodoflex under compression. The wound bed is clean. He went on to see Dr. Doren Custard on 09/27/2019 he again feels that the patient's arterial supply is adequate. He feels that he might benefit from right greater saphenous vein ablation for a venous ulcer. In the meantime the area that he was complaining about last week on the right fifth toe. An x-ray that I ordered showed marked soft tissue swelling along the distal aspect of the right foot but there was no evidence of osteomyelitis. He comes in today with the fifth toenail just about coming off. He has black eschar underneath the toe on the plantar aspect. The toe was swollen red and very painful. In the right setting this could be a significant soft tissue infection versus ischemia of the toe itself. He did not show this to Dr. Doren Custard 1/18; I am using Iodoflex under compression a large wound on the right posterior calf. He had his right greater saphenous vein ablation by Dr. Doren Custard  although I am not sure that is the only problem here. The right fifth toe which was possibly trauma 2 weeks ago continues to be exceptionally painful with a necrotic tip. Maybe not quite as swollen. I started him empirically on Augmentin 5/125 1 p.o. twice daily last week after discussing this with Dr. Loletha Grayer of nephrology. Perhaps somewhat better this week but not as good as I was hoping. A plain x-ray was negative. He comes in today with an area on the medial right calf that was blistered and now open. In looking at things he appears to be systemically fluid volume overloaded. He has a transplanted kidney. He states he takes his Lasix variably when he has appointments he tries not to take it the later I am really not certain if he takes this reliably. However he has  far too much edema fluid in the right leg to easily heal this wound and he appears to be developing blisters medially to form additional wounds 1/25; his CO2 angiogram was done by Dr. Doren Custard last week. This showed the proximal arteries all to be patent. On the right side the common femoral deep femoral superficial femoral popliteal anterior tibial and peroneal arteries were patent. The posterior tibial was occluded but reconstituted distally via collaterals from the peroneal artery he was felt he should have enough blood flow to heal his wounds including the right fifth toe. The right fifth toe looks better however he is still complaining of a lot of pain. The large area which is a venous ulceration posteriorly has a better looking surface I think we can switch to Hydrofera Blue today 2/1; the patient's original wounds on the right posterior medial calf has come down in width however superior to this he has new denuded areas and I am concerned we simply do not have enough edema control. He has already undergone a right greater saphenous ablation. He had a CO2 angiogram done by Dr. Doren Custard and the comment was that we should have enough blood flow to heal the venous wound however we simply do not seem to have enough edema control with 3 layer compression. The patient is status post kidney transplant although his exact renal function is not really clear. Nor am i sure what dose of diuretic he is supposed to be on. He also has the area on the right fifth toe which was unclear etiology but I think became secondarily infected I gave him 2 weeks of antibiotics for this and this seems to have settled down he still has a black eschar over the tip of the toe. X-ray was negative for fracture. He says he has a history of gout. 2/8; the patient's wound on the right posterior calf is about the same. The superficial area medially also about the same. He got a prescription for prednisoneo Gout after he developed  erythema on the dorsal aspect of his left great toe going along with the right fifth toe which has been problematic all along. He has not taken it because he is concerned about increasing CBGs. He has a transplanted kidney is already on prednisone 5 mg. He would not be a good candidate for NSAIDs. Perhaps colchicine. He is not aware of what his uric acid level is 2/15; his right posterior calf wound seems to be coming in in terms of width. Everything here looks fairly good. No mechanical debridement we have been using Hydrofera Blue. He has had an ablation by vein and vascular. Felt to have adequate arterial supply  to this area. The patient got prednisone last week from Dr. Angelique Holm of nephrology. At my urging he actually took it. The area on his left toe was a lot better. The right toe was not as painful but still erythematous with a wound at the tip. We have been using silver alginate here 2/22; right posterior calf seems to be gradually epithelializing. Still a fairly substantial wound. He still has an area on the tip of his left fifth toe which I think was gout related. This is gradually improving. We have been using Unna boots to wrap X-ray I did of the foot last time was again negative there was soft tissue irregularity about the distal fifth digit but no radiographic evidence of bone damage Electronic Signature(s) Signed: 11/12/2019 5:55:43 PM By: [name] MD Entered By: [name] on 11/12/2019 15:05:04 -------------------------------------------------------------------------------- Physical Exam Details Patient Name: Date of Service: CAETANO, OBERHAUS 11/12/2019 2:00 PM Medical Record [id number] Patient Account Number: [id number] Date of Birth/Sex: Treating RN: 08-Jul-1946 (74 y.o. [name] Primary Care Provider: [name] Other Clinician: Referring Provider: Treating Provider/Extender:Tyquavious Gamel, [name], [name] in Treatment:  14 Notes Wound exam The right fifth toe is much less swollen and edematous. Minimal tenderness is present. Still a small area at the tip. Large areas on the posterior right calf. Epithelialization looks healthy rims of epithelialization although this is going to take a very long time to close Electronic Signature(s) Signed: 11/12/2019 5:55:43 PM By: [name] MD Entered By: [name] on 11/12/2019 15:05:42 -------------------------------------------------------------------------------- Physician Orders Details Patient Name: Date of Service: MARGIE, BRINK 11/12/2019 2:00 PM Medical Record [id number] Patient Account Number: [id number] Date of Birth/Sex: Treating RN: 1945/10/26 (73 y.o. [name] Primary Care Provider: [name] Other Clinician: Referring Provider: Treating Provider/Extender:Meg Niemeier, [name], [name] in Treatment: 14 Verbal / Phone Orders: No Diagnosis Coding ICD-10 Coding Code Description S80.11XD Contusion of right lower leg, subsequent encounter L97.212 Non-pressure chronic ulcer of right calf with fat layer exposed I87.321 Chronic venous hypertension (idiopathic) with inflammation of right lower extremity E11.622 Type 2 diabetes mellitus with other skin ulcer L97.518 Non-pressure chronic ulcer of other part of right foot with other specified severity Follow-up Appointments Return Appointment in 1 week. Dressing Change Frequency Change dressing three times week. - all wounds - 2x by home health on Wednesdays and Fridays, 1x by wound clinic on Mondays Skin Barriers/Peri-Wound Care Barrier cream Moisturizing lotion Wound Cleansing Clean wound with Normal Saline. - or normal saline on days that dressing is changed May shower with protection. Primary Wound Dressing Wound #3 Right,Posterior Lower Leg Hydrofera Blue - Classic Wound #5 Right Toe Fifth Calcium Alginate with Silver Wound #6 Right,Medial  Lower Leg Calcium Alginate with Silver - any weeping/blistered areas Secondary Dressing Wound #3 Right,Posterior Lower Leg Foam - foam top bend of foot for protection ABD pad Zetuvit or Kerramax - or Xtrasorb Wound #5 Right Toe Fifth Kerlix/Rolled Gauze Dry Gauze Wound #6 Right,Medial Lower Leg ABD pad Zetuvit or Kerramax - or Xtrasorb Edema Control Unna Boot to Right Lower Extremity Avoid standing for long periods of time Elevate legs to the level of the heart or above for 30 minutes daily and/or when sitting, a frequency of: - throughout the day Exercise regularly Additional Orders / Instructions Other: - Go to ER if toe/leg gets more red/black, increased pain, fever Other: - Make follow up appt with Kidney doctor regarding uncontrolled swelling [facility] for wound  care. - [facility] Electronic Signature(s) Signed: 11/12/2019 5:55:43 PM By: [name] MD Signed: 11/15/2019 8:56:01 AM By: [name] RN, BSN Entered By: [name] on 11/12/2019 15:00:30 -------------------------------------------------------------------------------- Problem List Details Patient Name: Date of Service: KENYATTE, CHATMON 11/12/2019 2:00 PM Medical Record [id number] Patient Account Number: [id number] Date of Birth/Sex: Treating RN: Sep 05, 1946 (73 y.o. Jonette Eva, Briant Cedar Primary Care Provider: [name] Other Clinician: Referring Provider: Treating Provider/Extender:Heywood Tokunaga, [name], [name] in Treatment: 14 Active Problems ICD-10 Evaluated Encounter Code Description Active Date Today Diagnosis S80.11XD Contusion of right lower leg, subsequent encounter 08/06/2019 No Yes L97.212 Non-pressure chronic ulcer of right calf with fat layer 08/06/2019 No Yes exposed I87.321 Chronic venous hypertension (idiopathic) with 08/06/2019 No Yes inflammation of right lower extremity E11.622 Type 2 diabetes mellitus with  other skin ulcer 08/06/2019 No Yes L97.518 Non-pressure chronic ulcer of other part of right foot 10/01/2019 No Yes with other specified severity Inactive Problems Resolved Problems Electronic Signature(s) Signed: 11/12/2019 5:55:43 PM By: [name] MD Entered By: [name] on 11/12/2019 15:01:52 -------------------------------------------------------------------------------- Progress Note Details Patient Name: Date of Service: APOSTOLOS, BLAGG 11/12/2019 2:00 PM Medical Record [id number] Patient Account Number: [id number] Date of Birth/Sex: Treating RN: [birth year] (74 y.o. [name] Primary Care Provider: [name] Other Clinician: Referring Provider: Treating Provider/Extender:Rhylan Gross, [name], [name] in Treatment: 14 Subjective History of Present Illness (HPI) [place] HPI Description: 74 year old gentleman who was recently seen by his nephrologist [name], and noted to have a wound on his left lower extremity which was lacerated 2 months ago and now has reopened. The patient's left shin has a ulceration with some exudate but no evidence of infection and he was referred to [place] for further care as it was known that the patient has had some peripheral vascular disease in the past. Past medical history significant for chronic kidney disease, atrial fibrillation, diabetes mellitus,status post kidney transplant in 1983 and 2005, a week fistula graft placement, status post previous bowel surgery. 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 10/06/2015 -- x-ray of the left tibia and fibula shows no evidence of osteomyelitis. The patient has also had Doppler studies of his extremity and is awaiting the appointment with the vascular surgeon. We have not yet received these reports. 10/13/2015 -- lower extremity venous duplex reflux evaluation shows reflux in the left common femoral vein,  left saphenofemoral junction and the proximal greater saphenous vein extending to the proximal calf. There is also reflux in the left proximal to mid small saphenous vein. Arterial duplex studies done showed the resting ABI was not applicable due to tibial artery medial calcification. 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 The right TBI was 1 which is normal and the left ABI was 1 which is normal. Patient has otherwise been doing fine and has been compliant with his dressings. 10/20/2015 -- He was seen by [name] recently for a vascular opinion on 10/15/2015. His left lower extremity venous insufficiency duplex study revealed GSV reflux,SS vein reflux and deep venous reflux in the common femoral vein. His ABIs were non compressible and his TBI on the right was 1.01 and on the left was 0.80. He was asked to continue with the wound care with compressive therapy followed by EVLA of the left GS vein 3 months. He recommended  20-30 mm thigh-high compression stockings and the need for a three-month trial of this. The patient had an Unna boot applied at the vascular office but he could not tolerate this with a lot of pain and issues with his toes and hence came here on Friday for removal of this and we reapplied a 2 layer compression. 11/10/2015 -- patient still has not purchased his 20-30 mm thigh-high compression stockings as prescribed by [name]. Readmission: 08/08/18 on evaluation today patient presents for readmission concerning a new injury to the left anterior lower extremity. He was previously seen in 2017 here in our clinic. He states that he has done fairly well since that point. Nonetheless he is having at this time some pain but states that he hit this on a table that fell over and actually struck his leg. This appears to have pulled back some of his skin  which folded in on itself and is causing some difficulty as far as that is concerned. There does not appear to be any evidence of infection at this time. No fevers, chills, nausea, or vomiting noted at this time. He's been using dressings on his own currently without complication. 08/15/18 on evaluation today patient actually appears to be doing somewhat better in regard to his wound of the lower [name] when compared to the first visit last week. I had to do a much more extensive debridement at that time it does appear that I'm gonna have to perform some debridement today but it does not look to be as extensive by any means. Nonetheless fortunately he does not show any signs of infection he does have discomfort at this site. I believe based on what I'm seeing currently he may benefit from Iodoflex to help keep the wound bed clean. Patient tolerated therapy without complication. Upon evaluation today the patient actually appears to be doing excellent in regard to his left lower extremity ulcer. This is much better than the previous two visits where he had a lot of necrotic tissue around the edge of the wound simply due to the fact that again there was a significant skin tear where the edge had been cleared away prior to reattaching and being able to heal appropriately. He seems to be doing much better at this point. 08/28/18 on evaluation today the patient's wound actually does appear to be showing signs of improvement. With that being said though he is improving he would likely note even greater improvement if we were able to sharply debride the wound. Nonetheless this caused him to much discomfort he tells me. 09/04/18 on evaluation today patient actually appears to be showing signs of improvement in regard to his left lower extremity ulcer. He has been tolerating the dressing changes including the wrap although he tells me at this point that the burning does last for a couple of days even with  just the Iodoflex. I was afraid that this may been part of the issue that he was having with discomfort. It does seem to be the case. Nonetheless he shows no signs of evidence of infection at this time which is good news. No fevers chills noted ADMISSION to [name] 10/05/2018 This is a patient who was cared for in 2017 and again in the fall of this year at our sister clinic and [place]. He actually lives in [place] in [place]. We have been dealing with an apparent traumatic area on the left anterior tibial area. This is been present for the last several  months. He was supposed to be using Iodoflex Kerlix and Coban however he was hospitalized from 09/05/2018 through 09/11/2018 with delirium secondary to pneumonia. Since then he is only been putting Vaseline gauze on this without compression. He also has a more recent skin tear on the dorsal right hand that may have only happened in the last week. The patient had arterial studies done in 2017 in January which was 3 years ago. At that point he had noncompressible ABIs but really quite good TBI's both normal. Triphasic waveforms on the right monophasic at the left posterior tibial but triphasic at the left dorsalis pedis. His ABIs in our clinic today were both noncompressible 1/23; the patient has wounds on the right dorsal hand just distal to the wrist and on the left anterior lower extremity. Both of these look very healthy he is using Hydrofera Blue 1/30; left anterior lower extremity wound much smaller. Healthy looking surface. The laceration area just distal to the wrist on the dorsal hand on the right is also just about closed I used Hydrofera Blue here 2/6; left anterior lower extremity wound is much smaller but still open. The laceration area just distal to the wrist on the dorsal hand is fully epithelialized. 2/13; the patient's anterior lower extremity wound is closed. The laceration just distal to the wrist on the  dorsal hand is also fully epithelialized and closed. The patient has external compression stockings which I think are 20/30 READMISSION 08/06/2019 Mr. Stolp is a 74 year old man with had several times previously in our clinic. He is a diabetic with a history of chronic renal insufficiency status post kidney transplant in 1983 and again in 2005. He was then in 2017 with a laceration on the left lower extremity. He was worked up at the time with arterial studies and reflux studies. The arterial studies showed ABIs to be noncompressible but TBI's were within normal limits. I do not have the reflux studies at the moment. He was also sent here in 2019 with a left lower extremity wound and then again in 2020 with left lower extremity trauma a skin tear on the wrist. He was discharged with 20/30 stockings identified from myself that that might not be enough compression. Nevertheless he states he was wearing these fairly reliably. In September he had a fall with a substantial bruise in the area of the wound. He says he saw orthopedics and they told him there was some muscle strain sometime it later this opened into a wound. He has a fairly substantial wound on the right posterior calf. Satellite areas around this including medially and posteriorly. He has not worn his stockings since the injury Past medical history; includes chronic renal failure secondary to diabetes with kidney transplant x2, atrial fibrillation, heart failure with preserved ejection fraction, coronary artery disease. ABIs on the right in our clinic were once again noncompressible 08/13/2019 on evaluation today patient appears to be doing decently well with regard to his wound compared to last week's evaluation. Unfortunately he is still having a lot of discomfort at this point which is I think in some part due to the 3 layer compression wrap which is a little bit stronger I think for him. When he was here before we  actually utilized a Kerlix and Coban wrap which he states seemed to got a little bit better. Nonetheless I think we can probably drop back to this in light of the discomfort that he had. Nonetheless the pain was not really right around the  wound itself as much as it was around the ankle in particular. The Iodoflex does seem to have done well for him As the wound is appearing somewhat better today which is excellent news. 11/30; still complaining of a lot of pain. Apparently arterial studies I ordered 2 weeks ago are below. I do not believe we have an appointment with vein and vascular as of yet; ABI Findings: +---------+------------------+-----+----------+--------+ Right Rt Pressure (mmHg)IndexWaveform Comment  +---------+------------------+-----+----------+--------+ PTA >254 1.50 monophasic  +---------+------------------+-----+----------+--------+ DP >254 1.50 monophasic  +---------+------------------+-----+----------+--------+ Great Toe68 0.40 Abnormal   +---------+------------------+-----+----------+--------+ +---------+------------------+-----+----------+-------+ Left Lt Pressure (mmHg)IndexWaveform Comment +---------+------------------+-----+----------+-------+ Brachial 169     +---------+------------------+-----+----------+-------+ PTA >254 1.50 monophasic  +---------+------------------+-----+----------+-------+ DP >254 1.50 monophasic  +---------+------------------+-----+----------+-------+ Great Toe40 0.24 Abnormal   +---------+------------------+-----+----------+-------+ Pedal arteries appear hyperemic. Patient refused Brachial pressure in the right arm. Summary: Right: Resting right ankle-brachial index indicates noncompressible right lower extremity arteries. The right toe- brachial index is abnormal. Left: Resting left ankle-brachial index indicates noncompressible left lower extremity arteries. The left  toe-brachial index is abnormal. He is also having considerably more swelling in his left calf. This was not there when I last saw him 2 weeks ago. He tells me that some of the home health compression wraps have been slipping down and that may be the issue here however a month I am uncertain 19/6; sees vascular on December 22. Still complaining of a lot of pain. DVT study I did last time was negative for DVT 12/14; still complaining of pain which if this is arterial is certainly claudication and rest keeps him uncomfortable at night. He has an appointment with vein and vascular on December 22. Wound surface is better using Iodoflex. Once the surface of this is satisfactory and we have exhausted the vascular route. Perhaps an advanced treatment option. He has a configuration of the venous ulceration although his arterial studies are not very good. The other issue is the patient has a transplanted kidney. This will make angiography difficult and challenging issue 12/21; still complaining of pain and drainage. We are using Iodoflex on the wound under compression. He sees Dr. Donnetta Hutching tomorrow to evaluate his noninvasive studies noted above. He has a transplanted kidney further complicating the options for angiography. 12/28; still using Iodoflex under compression. I have Dr. Luther Parody note from 12/22. he noted arterial studies revealing monophasic waveforms at the pedal vessels bilaterally and calcified vessels making the ABI unreliable. He did not comment on the reduced TBI's. He felt these were venous wounds based on the palpable dorsalis pedis pulse. 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 And they arranged for formal venous duplex with reflux studies in the next several weeks. Follow-up with either Scot Dock or Dr. Oneida Alar 09/24/2019 still using Iodoflex under compression. He has an appointment with Dr. Doren Custard on 1/7 with regards to his venous disease. The patient was not felt to have a primary  arterial problem for the nonhealing of his wound. We did attempt to wrap him with 3 layer when he first came into the clinic he complained of a lot of pain in the ankle although this may have been because the dressing fell down somewhat. He has far too edema fluid in the right leg for a good prognosis about healing this wound He comes in today with an excoriation on the bottom part of his right fifth toe. He thinks he may have done this taking off his clothes and that something got caught on the toe. There is no open wound however the  toe itself is very painful 1/11; we are using Iodoflex under compression. The wound bed is clean. He went on to see Dr. Doren Custard on 09/27/2019 he again feels that the patient's arterial supply is adequate. He feels that he might benefit from right greater saphenous vein ablation for a venous ulcer. In the meantime the area that he was complaining about last week on the right fifth toe. An x-ray that I ordered showed marked soft tissue swelling along the distal aspect of the right foot but there was no evidence of osteomyelitis. He comes in today with the fifth toenail just about coming off. He has black eschar underneath the toe on the plantar aspect. The toe was swollen red and very painful. In the right setting this could be a significant soft tissue infection versus ischemia of the toe itself. He did not show this to Dr. Doren Custard 1/18; I am using Iodoflex under compression a large wound on the right posterior calf. He had his right greater saphenous vein ablation by Dr. Doren Custard although I am not sure that is the only problem here. ooThe right fifth toe which was possibly trauma 2 weeks ago continues to be exceptionally painful with a necrotic tip. Maybe not quite as swollen. I started him empirically on Augmentin 5/125 1 p.o. twice daily last week after discussing this with Dr. Loletha Grayer of nephrology. Perhaps somewhat better this week but not as good as I was hoping. A plain  x-ray was negative. He comes in today with an area on the medial right calf that was blistered and now open. In looking at things he appears to be systemically fluid volume overloaded. He has a transplanted kidney. He states he takes his Lasix variably when he has appointments he tries not to take it the later I am really not certain if he takes this reliably. However he has far too much edema fluid in the right leg to easily heal this wound and he appears to be developing blisters medially to form additional wounds 1/25; his CO2 angiogram was done by Dr. Doren Custard last week. This showed the proximal arteries all to be patent. On the right side the common femoral deep femoral superficial femoral popliteal anterior tibial and peroneal arteries were patent. The posterior tibial was occluded but reconstituted distally via collaterals from the peroneal artery he was felt he should have enough blood flow to heal his wounds including the right fifth toe. The right fifth toe looks better however he is still complaining of a lot of pain. The large area which is a venous ulceration posteriorly has a better looking surface I think we can switch to Hydrofera Blue today 2/1; the patient's original wounds on the right posterior medial calf has come down in width however superior to this he has new denuded areas and I am concerned we simply do not have enough edema control. He has already undergone a right greater saphenous ablation. He had a CO2 angiogram done by Dr. Doren Custard and the comment was that we should have enough blood flow to heal the venous wound however we simply do not seem to have enough edema control with 3 layer compression. The patient is status post kidney transplant although his exact renal function is not really clear. Nor am i sure what dose of diuretic he is supposed to be on. He also has the area on the right fifth toe which was unclear etiology but I think became secondarily infected I  gave him 2 weeks of antibiotics  for this and this seems to have settled down he still has a black eschar over the tip of the toe. X-ray was negative for fracture. He says he has a history of gout. 2/8; the patient's wound on the right posterior calf is about the same. The superficial area medially also about the same. He got a prescription for prednisoneo Gout after he developed erythema on the dorsal aspect of his left great toe going along with the right fifth toe which has been problematic all along. He has not taken it because he is concerned about increasing CBGs. He has a transplanted kidney is already on prednisone 5 mg. He would not be a good candidate for NSAIDs. Perhaps colchicine. He is not aware of what his uric acid level is 2/15; his right posterior calf wound seems to be coming in in terms of width. Everything here looks fairly good. No mechanical debridement we have been using Hydrofera Blue. He has had an ablation by vein and vascular. Felt to have adequate arterial supply to this area. The patient got prednisone last week from Dr. Angelique Holm of nephrology. At my urging he actually took it. The area on his left toe was a lot better. The right toe was not as painful but still erythematous with a wound at the tip. We have been using silver alginate here 2/22; right posterior calf seems to be gradually epithelializing. Still a fairly substantial wound. He still has an area on the tip of his left fifth toe which I think was gout related. This is gradually improving. We have been using Unna boots to wrap X-ray I did of the foot last time was again negative there was soft tissue irregularity about the distal fifth digit but no radiographic evidence of bone damage Objective Constitutional Vitals Time Taken: 2:04 PM, Height: 67 in, Weight: 190 lbs, BMI: 29.8, Temperature: 98.2 F, Pulse: 53 bpm, Respiratory Rate: 20 breaths/min, Blood Pressure: 144/58 mmHg, Capillary Blood Glucose:  97 mg/dl. Integumentary (Hair, Skin) Wound #3 status is Open. Original cause of wound was Trauma. The wound is located on the Right,Posterior Lower Leg. The wound measures 15cm length x 4.6cm width x 0.3cm depth; 54.192cm^2 area and 16.258cm^3 volume. There is Fat Layer (Subcutaneous Tissue) Exposed exposed. There is no tunneling or undermining noted. There is a large amount of serosanguineous drainage noted. The wound margin is thickened. There is large (67-100%) pink granulation within the wound bed. There is no necrotic tissue within the wound bed. Wound #5 status is Open. Original cause of wound was Blister. The wound is located on the Right Toe Fifth. The wound measures 0.5cm length x 0.8cm width x 0.3cm depth; 0.314cm^2 area and 0.094cm^3 volume. There is no tunneling or undermining noted. There is a small amount of serous drainage noted. The wound margin is distinct with the outline attached to the wound base. There is no granulation within the wound bed. There is a large (67- 100%) amount of necrotic tissue within the wound bed including Adherent Slough. Wound #6 status is Open. Original cause of wound was Blister. The wound is located on the Right,Medial Lower Leg. The wound measures 1.2cm length x 0.3cm width x 0.1cm depth; 0.283cm^2 area and 0.028cm^3 volume. There is Fat Layer (Subcutaneous Tissue) Exposed exposed. There is no tunneling or undermining noted. There is a medium amount of serous drainage noted. The wound margin is distinct with the outline attached to the wound base. There is medium (34-66%) pink granulation within the wound bed. There  is a medium (34-66%) amount of necrotic tissue within the wound bed including Adherent Slough. Wound #7 status is Healed - Epithelialized. Original cause of wound was Blister. The wound is located on the Right,Distal,Posterior Lower Leg. The wound measures 0cm length x 0cm width x 0cm depth; 0cm^2 area and 0cm^3 volume. 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 There was a Haematologist Compression Therapy Procedure by [name], RN. Post procedure Diagnosis Wound #3: Same as Pre-Procedure Wound #6 Pre-procedure diagnosis of Wound #6 is a Venous Leg Ulcer located on the Right,Medial Lower Leg . There was a Haematologist Compression Therapy Procedure by [name], RN. Post procedure Diagnosis Wound #6: Same as Pre-Procedure Plan Follow-up Appointments: Return Appointment in 1 week. Dressing Change Frequency: Change dressing three times week. - all wounds - 2x by home health on Wednesdays and Fridays, 1x by wound clinic on Mondays Skin Barriers/Peri-Wound Care: Barrier cream Moisturizing lotion Wound Cleansing: Clean wound with Normal Saline. - or normal saline on days that dressing is changed May shower with protection. Primary Wound Dressing: Wound #3 Right,Posterior Lower Leg: Hydrofera Blue - Classic Wound #5 Right Toe Fifth: Calcium Alginate with Silver Wound #6 Right,Medial Lower Leg: Calcium Alginate with Silver - any weeping/blistered areas Secondary Dressing: Wound #3 Right,Posterior Lower Leg: Foam - foam top bend of foot for protection ABD pad Zetuvit or Kerramax - or Xtrasorb Wound #5 Right Toe Fifth: Kerlix/Rolled Gauze Dry Gauze Wound #6 Right,Medial Lower Leg: ABD pad Zetuvit or Kerramax - or Xtrasorb Edema Control: Unna Boot to Right Lower Extremity Avoid standing for long periods of time Elevate legs to the level of the heart or above for 30 minutes  daily and/or when sitting, a frequency of: - throughout the day Exercise regularly Additional Orders / Instructions: Other: - Go to ER if toe/leg gets more red/black, increased pain, fever Other: - Make follow up appt with Kidney doctor regarding uncontrolled swelling Home Health: [facility] for wound care. - [facility] 1. Continuing Hydrofera Blue to the major wound area 2. Silver alginate to the right fifth toe 3. The swelling pain and erythema was probably gout. Electronic Signature(s) Signed: 11/12/2019 5:55:43 PM By: [name] MD Entered By: [name] on 11/12/2019 15:06:21 -------------------------------------------------------------------------------- SuperBill Details Patient Name: Date of Service: PLEASANT, BENSINGER 11/12/2019 Medical Record [id number] Patient Account Number: [id number] Date of Birth/Sex: Treating RN: [birth year] (74 y.o. [name] Primary Care Provider: [name] Other Clinician: Referring Provider: Treating Provider/Extender:Wilford Merryfield, [name], [name] in Treatment: 14 Diagnosis Coding ICD-10 Codes Code Description S80.11XD Contusion of right lower leg, subsequent encounter L97.212 Non-pressure chronic ulcer of right calf with fat layer exposed I87.321 Chronic venous hypertension (idiopathic) with inflammation of right lower extremity E11.622 Type 2 diabetes mellitus with other skin ulcer L97.518 Non-pressure chronic ulcer of other part of right foot with other specified severity Facility Procedures CPT4 Code Description: [id number] (Facility Use Only) [id number] - Dorise Bullion BOOT RT Modifier: Quantity: 1 Physician Procedures CPT4 Code Description: [id number] - WC PHYS LEVEL 3 - EST PT ICD-10 Diagnosis Description L97.212 Non-pressure chronic  ulcer of right calf with fat layer ex L97.518 Non-pressure chronic ulcer of other part of right foot wit Modifier: posed h other  specifie Quantity: 1 d severity Electronic Signature(s) Signed: 11/12/2019 5:55:43 PM By: [name] MD Entered By: [name] on 11/12/2019 15:06:46

## 2019-11-19 ENCOUNTER — Encounter (HOSPITAL_BASED_OUTPATIENT_CLINIC_OR_DEPARTMENT_OTHER): Payer: Medicare Other | Attending: Internal Medicine | Admitting: Internal Medicine

## 2019-11-19 ENCOUNTER — Other Ambulatory Visit: Payer: Self-pay

## 2019-11-19 DIAGNOSIS — I2510 Atherosclerotic heart disease of native coronary artery without angina pectoris: Secondary | ICD-10-CM | POA: Insufficient documentation

## 2019-11-19 DIAGNOSIS — N189 Chronic kidney disease, unspecified: Secondary | ICD-10-CM | POA: Insufficient documentation

## 2019-11-19 DIAGNOSIS — L97512 Non-pressure chronic ulcer of other part of right foot with fat layer exposed: Secondary | ICD-10-CM | POA: Insufficient documentation

## 2019-11-19 DIAGNOSIS — S81821A Laceration with foreign body, right lower leg, initial encounter: Secondary | ICD-10-CM | POA: Insufficient documentation

## 2019-11-19 DIAGNOSIS — S81812A Laceration without foreign body, left lower leg, initial encounter: Secondary | ICD-10-CM | POA: Insufficient documentation

## 2019-11-19 DIAGNOSIS — I5032 Chronic diastolic (congestive) heart failure: Secondary | ICD-10-CM | POA: Diagnosis not present

## 2019-11-19 DIAGNOSIS — X58XXXA Exposure to other specified factors, initial encounter: Secondary | ICD-10-CM | POA: Diagnosis not present

## 2019-11-19 DIAGNOSIS — E11622 Type 2 diabetes mellitus with other skin ulcer: Secondary | ICD-10-CM | POA: Insufficient documentation

## 2019-11-19 DIAGNOSIS — S91109A Unspecified open wound of unspecified toe(s) without damage to nail, initial encounter: Secondary | ICD-10-CM | POA: Diagnosis not present

## 2019-11-19 DIAGNOSIS — I4891 Unspecified atrial fibrillation: Secondary | ICD-10-CM | POA: Insufficient documentation

## 2019-11-19 DIAGNOSIS — E1140 Type 2 diabetes mellitus with diabetic neuropathy, unspecified: Secondary | ICD-10-CM | POA: Insufficient documentation

## 2019-11-19 DIAGNOSIS — L97811 Non-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: Secondary | ICD-10-CM | POA: Diagnosis not present

## 2019-11-19 DIAGNOSIS — L97212 Non-pressure chronic ulcer of right calf with fat layer exposed: Secondary | ICD-10-CM | POA: Insufficient documentation

## 2019-11-19 DIAGNOSIS — I130 Hypertensive heart and chronic kidney disease with heart failure and stage 1 through stage 4 chronic kidney disease, or unspecified chronic kidney disease: Secondary | ICD-10-CM | POA: Diagnosis not present

## 2019-11-19 DIAGNOSIS — E11621 Type 2 diabetes mellitus with foot ulcer: Secondary | ICD-10-CM | POA: Insufficient documentation

## 2019-11-19 DIAGNOSIS — E1151 Type 2 diabetes mellitus with diabetic peripheral angiopathy without gangrene: Secondary | ICD-10-CM | POA: Insufficient documentation

## 2019-11-19 DIAGNOSIS — I87321 Chronic venous hypertension (idiopathic) with inflammation of right lower extremity: Secondary | ICD-10-CM | POA: Diagnosis not present

## 2019-11-19 DIAGNOSIS — S90121A Contusion of right lesser toe(s) without damage to nail, initial encounter: Secondary | ICD-10-CM | POA: Diagnosis not present

## 2019-11-19 DIAGNOSIS — E1122 Type 2 diabetes mellitus with diabetic chronic kidney disease: Secondary | ICD-10-CM | POA: Insufficient documentation

## 2019-11-19 DIAGNOSIS — W2203XA Walked into furniture, initial encounter: Secondary | ICD-10-CM | POA: Diagnosis not present

## 2019-11-19 DIAGNOSIS — Z940 Kidney transplant status: Secondary | ICD-10-CM | POA: Diagnosis not present
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
## 2019-11-19 NOTE — Progress Notes (Signed)
[name], [name] ([id number]) Visit Report for 11/19/2019 Debridement Details Patient Name: Date of Service: [name], [name] 11/19/2019 9:00 AM Medical Record [id number] Patient Account Number: [id number] Date of Birth/Sex: Treating RN: Sep 15, 1946 (74 y.o. [name] Primary Care Provider: [name] Other Clinician: Referring Provider: Treating Provider/Extender:Terriana Barreras, [name], [name] in Treatment: 15 Debridement Performed for Wound #3 Right,Posterior Lower Leg Assessment: Performed By: Physician Ricard Dillon., MD Debridement Type: Debridement Severity of Tissue Pre Fat layer exposed Debridement: Level of Consciousness (Pre- Awake and Alert procedure): Pre-procedure Verification/Time Out Taken: Yes - 09:55 Start Time: 09:55 Pain Control: Other : Benzocaine 20% Total Area Debrided (L x W): 8 (cm) x 2 (cm) = 16 (cm) Tissue and other material Viable, Non-Viable, Subcutaneous debrided: Level: Skin/Subcutaneous Tissue Debridement Description: Excisional Instrument: Curette Bleeding: Moderate Hemostasis Achieved: Silver Nitrate End Time: 09:55 Procedural Pain: 3 Post Procedural Pain: 1 Response to Treatment: Procedure was tolerated well Level of Consciousness Awake and Alert (Post-procedure): Post Debridement Measurements of Total Wound Length: (cm) 15 Width: (cm) 5 Depth: (cm) 0.3 Volume: (cm) 17.671 Character of Wound/Ulcer Post Improved Debridement: Severity of Tissue Post Debridement: Fat layer exposed Post Procedure Diagnosis Same as Pre-procedure Electronic Signature(s) Signed: 11/19/2019 5:45:55 PM By: [name] MD Signed: 11/19/2019 5:57:14 PM By: [name] RN, BSN Entered By: [name] on 11/19/2019 10:00:32 -------------------------------------------------------------------------------- Debridement Details Patient Name: Date of Service: [name], [name] 11/19/2019 9:00 AM Medical Record  [id number] Patient Account Number: [id number] Date of Birth/Sex: Treating RN: Apr 03, 1946 (74 y.o. [name] Primary Care Provider: [name] Other Clinician: Referring Provider: Treating Provider/Extender:Kaziah Krizek, [name], [name] in Treatment: 15 Debridement Performed for Wound #5 Right Toe Fifth Assessment: Performed By: Physician Ricard Dillon., MD Debridement Type: Debridement Severity of Tissue Pre Fat layer exposed Debridement: Level of Consciousness (Pre- Awake and Alert procedure): Pre-procedure Yes - 09:55 Verification/Time Out Taken: Start Time: 09:58 Pain Control: Other : Benzocaine 20% Total Area Debrided (L x W): 0.6 (cm) x 1 (cm) = 0.6 (cm) Tissue and other material Viable, Non-Viable, Slough, Subcutaneous, Slough debrided: Level: Skin/Subcutaneous Tissue Debridement Description: Excisional Instrument: Curette Bleeding: Minimum Hemostasis Achieved: Pressure End Time: 09:59 Procedural Pain: 3 Post Procedural Pain: 1 Response to Treatment: Procedure was tolerated well Level of Consciousness Awake and Alert (Post-procedure): Post Debridement Measurements of Total Wound Length: (cm) 0.6 Width: (cm) 1 Depth: (cm) 0.1 Volume: (cm) 0.047 Character of Wound/Ulcer Post Improved Debridement: Severity of Tissue Post Debridement: Fat layer exposed Post Procedure Diagnosis Same as Pre-procedure Electronic Signature(s) Signed: 11/19/2019 5:45:55 PM By: [name] MD Signed: 11/19/2019 5:57:14 PM By: [name] RN, BSN Entered By: [name] on 11/19/2019 10:00:41 -------------------------------------------------------------------------------- HPI Details Patient Name: Date of Service: [name], [name] 11/19/2019 9:00 AM Medical Record [id number] Patient Account Number: [id number] Date of Birth/Sex: Treating RN: [birth year] (73 y.o. [name] Primary Care Provider: [name] Other Clinician: Referring Provider: Treating Provider/Extender:Courvoisier Hamblen, [name], [name] in Treatment: 15 History of Present Illness HPI Description: [name] HPI Description: 74 year old gentleman who was recently seen by his nephrologist [name], and noted to have a wound on his left lower extremity which was lacerated 2 months ago and now has reopened. The patient's left shin has a ulceration with some exudate but no evidence of infection and he was referred to [place] for further care as it was known that the patient has had some peripheral vascular disease in the past. Past medical history significant for chronic  kidney disease, atrial fibrillation, diabetes mellitus,status post kidney transplant in 1983 and 2005, a week fistula graft placement, status post previous bowel surgery. 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 10/06/2015 -- x-ray of the left tibia and fibula shows no evidence of osteomyelitis. The patient has also had Doppler studies of his extremity and is awaiting the appointment with the vascular surgeon. We have not yet received these reports. 10/13/2015 -- lower extremity venous duplex reflux evaluation shows reflux in the left common femoral vein, left saphenofemoral junction and the proximal greater saphenous vein extending to the proximal calf. There is also reflux in the left proximal to mid small saphenous vein. Arterial duplex studies done showed the resting ABI was not applicable due to tibial artery medial calcification. 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 The right TBI was 1 which is normal and the left ABI was 1 which is normal. Patient has otherwise been doing fine and has been compliant with his dressings. 10/20/2015 -- He was seen by [name] recently for a  vascular opinion on 10/15/2015. His left lower extremity venous insufficiency duplex study revealed GSV reflux,SS vein reflux and deep venous reflux in the common femoral vein. His ABIs were non compressible and his TBI on the right was 1.01 and on the left was 0.80. He was asked to continue with the wound care with compressive therapy followed by EVLA of the left GS vein 3 months. He recommended 20-30 mm thigh-high compression stockings and the need for a three-month trial of this. The patient had an Unna boot applied at the vascular office but he could not tolerate this with a lot of pain and issues with his toes and hence came here on Friday for removal of this and we reapplied a 2 layer compression. 11/10/2015 -- patient still has not purchased his 20-30 mm thigh-high compression stockings as prescribed by [name]. Readmission: 08/08/18 on evaluation today patient presents for readmission concerning a new injury to the left anterior lower extremity. He was previously seen in 2017 here in our clinic. He states that he has done fairly well since that point. Nonetheless he is having at this time some pain but states that he hit this on a table that fell over and actually struck his leg. This appears to have pulled back some of his skin which folded in on itself and is causing some difficulty as far as that is concerned. There does not appear to be any evidence of infection at this time. No fevers, chills, nausea, or vomiting noted at this time. He's been using dressings on his own currently without complication. 08/15/18 on evaluation today patient actually appears to be doing somewhat better in regard to his wound of the lower [name] when compared to the first visit last week. I had to do a much more extensive debridement at that time it does appear that I'm gonna have to perform some debridement today but it does not look to be as extensive by any means. Nonetheless fortunately he does not  show any signs of infection he does have discomfort at this site. I believe based on what I'm seeing currently he may benefit from Iodoflex to help keep the wound bed clean. Patient tolerated therapy without complication. Upon evaluation today the patient actually appears to be doing excellent in regard  to his left lower extremity ulcer. This is much better than the previous two visits where he had a lot of necrotic tissue around the edge of the wound simply due to the fact that again there was a significant skin tear where the edge had been cleared away prior to reattaching and being able to heal appropriately. He seems to be doing much better at this point. 08/28/18 on evaluation today the patient's wound actually does appear to be showing signs of improvement. With that being said though he is improving he would likely note even greater improvement if we were able to sharply debride the wound. Nonetheless this caused him to much discomfort he tells me. 09/04/18 on evaluation today patient actually appears to be showing signs of improvement in regard to his left lower extremity ulcer. He has been tolerating the dressing changes including the wrap although he tells me at this point that the burning does last for a couple of days even with just the Iodoflex. I was afraid that this may been part of the issue that he was having with discomfort. It does seem to be the case. Nonetheless he shows no signs of evidence of infection at this time which is good news. No fevers chills noted ADMISSION to [name] 10/05/2018 This is a patient who was cared for in 2017 and again in the fall of this year at our sister clinic and [place]. He actually lives in [place] in [place]. We have been dealing with an apparent traumatic area on the left anterior tibial area. This is been present for the last several months. He was supposed to be using Iodoflex Kerlix and Coban however he was  hospitalized from 09/05/2018 through 09/11/2018 with delirium secondary to pneumonia. Since then he is only been putting Vaseline gauze on this without compression. He also has a more recent skin tear on the dorsal right hand that may have only happened in the last week. The patient had arterial studies done in 2017 in January which was 3 years ago. At that point he had noncompressible ABIs but really quite good TBI's both normal. Triphasic waveforms on the right monophasic at the left posterior tibial but triphasic at the left dorsalis pedis. His ABIs in our clinic today were both noncompressible 1/23; the patient has wounds on the right dorsal hand just distal to the wrist and on the left anterior lower extremity. Both of these look very healthy he is using Hydrofera Blue 1/30; left anterior lower extremity wound much smaller. Healthy looking surface. The laceration area just distal to the wrist on the dorsal hand on the right is also just about closed I used Hydrofera Blue here 2/6; left anterior lower extremity wound is much smaller but still open. The laceration area just distal to the wrist on the dorsal hand is fully epithelialized. 2/13; the patient's anterior lower extremity wound is closed. The laceration just distal to the wrist on the dorsal hand is also fully epithelialized and closed. The patient has external compression stockings which I think are 20/30 READMISSION 08/06/2019 [name] is a 74 year old man with had several times previously in our clinic. He is a diabetic with a history of chronic renal insufficiency status post kidney transplant in 1983 and again in 2005. He was then in 2017 with a laceration on the left lower extremity. He was worked up at the time with arterial studies and reflux studies. The arterial studies showed ABIs to be noncompressible but TBI's were  within normal limits. I do not have the reflux studies at the moment. He was also sent here in 2019 with  a left lower extremity wound and then again in 2020 with left lower extremity trauma a skin tear on the wrist. He was discharged with 20/30 stockings identified from myself that that might not be enough compression. Nevertheless he states he was wearing these fairly reliably. In September he had a fall with a substantial bruise in the area of the wound. He says he saw orthopedics and they told him there was some muscle strain sometime it later this opened into a wound. He has a fairly substantial wound on the right posterior calf. Satellite areas around this including medially and posteriorly. He has not worn his stockings since the injury Past medical history; includes chronic renal failure secondary to diabetes with kidney transplant x2, atrial fibrillation, heart failure with preserved ejection fraction, coronary artery disease. ABIs on the right in our clinic were once again noncompressible 08/13/2019 on evaluation today patient appears to be doing decently well with regard to his wound compared to last week's evaluation. Unfortunately he is still having a lot of discomfort at this point which is I think in some part due to the 3 layer compression wrap which is a little bit stronger I think for him. When he was here before we actually utilized a Kerlix and Coban wrap which he states seemed to got a little bit better. Nonetheless I think we can probably drop back to this in light of the discomfort that he had. Nonetheless the pain was not really right around the wound itself as much as it was around the ankle in particular. The Iodoflex does seem to have done well for him As the wound is appearing somewhat better today which is excellent news. 11/30; still complaining of a lot of pain. Apparently arterial studies I ordered 2 weeks ago are below. I do not believe we have an appointment with vein and vascular as of yet; ABI  Findings: +---------+------------------+-----+----------+--------+ Right Rt Pressure (mmHg)IndexWaveform Comment  +---------+------------------+-----+----------+--------+ PTA >254 1.50 monophasic  +---------+------------------+-----+----------+--------+ DP >254 1.50 monophasic  +---------+------------------+-----+----------+--------+ Great Toe68 0.40 Abnormal   +---------+------------------+-----+----------+--------+ +---------+------------------+-----+----------+-------+ Left Lt Pressure (mmHg)IndexWaveform Comment +---------+------------------+-----+----------+-------+ Brachial 169     +---------+------------------+-----+----------+-------+ PTA >254 1.50 monophasic  +---------+------------------+-----+----------+-------+ DP >254 1.50 monophasic  +---------+------------------+-----+----------+-------+ Great Toe40 0.24 Abnormal   +---------+------------------+-----+----------+-------+ Pedal arteries appear hyperemic. Patient refused Brachial pressure in the right arm. Summary: Right: Resting right ankle-brachial index indicates noncompressible right lower extremity arteries. The right toe- brachial index is abnormal. Left: Resting left ankle-brachial index indicates noncompressible left lower extremity arteries. The left toe-brachial index is abnormal. He is also having considerably more swelling in his left calf. This was not there when I last saw him 2 weeks ago. He tells me that some of the home health compression wraps have been slipping down and that may be the issue here however a month I am uncertain 22/9; sees vascular on December 22. Still complaining of a lot of pain. DVT study I did last time was negative for DVT 12/14; still complaining of pain which if this is arterial is certainly claudication and rest keeps him uncomfortable at night. He has an appointment with vein and vascular on December 22. Wound surface is  better using Iodoflex. Once the surface of this is satisfactory and we have exhausted the vascular route. Perhaps an advanced treatment option. He has a configuration of the venous ulceration although his arterial studies are not  very good. The other issue is the patient has a transplanted kidney. This will make angiography difficult and challenging issue 12/21; still complaining of pain and drainage. We are using Iodoflex on the wound under compression. He sees Dr. Donnetta Hutching tomorrow to evaluate his noninvasive studies noted above. He has a transplanted kidney further complicating the options for angiography. 12/28; still using Iodoflex under compression. I have Dr. Luther Parody note from 12/22. he noted arterial studies revealing monophasic waveforms at the pedal vessels bilaterally and calcified vessels making the ABI unreliable. He did not comment on the reduced TBI's. He felt these were venous wounds based on the palpable dorsalis pedis pulse. 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 And they arranged for formal venous duplex with reflux studies in the next several weeks. Follow-up with either Scot Dock or Dr. Oneida Alar 09/24/2019 still using Iodoflex under compression. He has an appointment with Dr. Doren Custard on 1/7 with regards to his venous disease. The patient was not felt to have a primary arterial problem for the nonhealing of his wound. We did attempt to wrap him with 3 layer when he first came into the clinic he complained of a lot of pain in the ankle although this may have been because the dressing fell down somewhat. He has far too edema fluid in the right leg for a good prognosis about healing this wound He comes in today with an excoriation on the bottom part of his right fifth toe. He thinks he may have done this taking off his clothes and that something got caught on the toe. There is no open wound however the toe itself is very painful 1/11; we are using Iodoflex under compression. The  wound bed is clean. He went on to see Dr. Doren Custard on 09/27/2019 he again feels that the patient's arterial supply is adequate. He feels that he might benefit from right greater saphenous vein ablation for a venous ulcer. In the meantime the area that he was complaining about last week on the right fifth toe. An x-ray that I ordered showed marked soft tissue swelling along the distal aspect of the right foot but there was no evidence of osteomyelitis. He comes in today with the fifth toenail just about coming off. He has black eschar underneath the toe on the plantar aspect. The toe was swollen red and very painful. In the right setting this could be a significant soft tissue infection versus ischemia of the toe itself. He did not show this to Dr. Doren Custard 1/18; I am using Iodoflex under compression a large wound on the right posterior calf. He had his right greater saphenous vein ablation by Dr. Doren Custard although I am not sure that is the only problem here. The right fifth toe which was possibly trauma 2 weeks ago continues to be exceptionally painful with a necrotic tip. Maybe not quite as swollen. I started him empirically on Augmentin 5/125 1 p.o. twice daily last week after discussing this with Dr. Loletha Grayer of nephrology. Perhaps somewhat better this week but not as good as I was hoping. A plain x-ray was negative. He comes in today with an area on the medial right calf that was blistered and now open. In looking at things he appears to be systemically fluid volume overloaded. He has a transplanted kidney. He states he takes his Lasix variably when he has appointments he tries not to take it the later I am really not certain if he takes this reliably. However he has far too much  edema fluid in the right leg to easily heal this wound and he appears to be developing blisters medially to form additional wounds 1/25; his CO2 angiogram was done by Dr. Doren Custard last week. This showed the proximal arteries all to be  patent. On the right side the common femoral deep femoral superficial femoral popliteal anterior tibial and peroneal arteries were patent. The posterior tibial was occluded but reconstituted distally via collaterals from the peroneal artery he was felt he should have enough blood flow to heal his wounds including the right fifth toe. The right fifth toe looks better however he is still complaining of a lot of pain. The large area which is a venous ulceration posteriorly has a better looking surface I think we can switch to Hydrofera Blue today 2/1; the patient's original wounds on the right posterior medial calf has come down in width however superior to this he has new denuded areas and I am concerned we simply do not have enough edema control. He has already undergone a right greater saphenous ablation. He had a CO2 angiogram done by Dr. Doren Custard and the comment was that we should have enough blood flow to heal the venous wound however we simply do not seem to have enough edema control with 3 layer compression. The patient is status post kidney transplant although his exact renal function is not really clear. Nor am i sure what dose of diuretic he is supposed to be on. He also has the area on the right fifth toe which was unclear etiology but I think became secondarily infected I gave him 2 weeks of antibiotics for this and this seems to have settled down he still has a black eschar over the tip of the toe. X-ray was negative for fracture. He says he has a history of gout. 2/8; the patient's wound on the right posterior calf is about the same. The superficial area medially also about the same. He got a prescription for prednisoneo Gout after he developed erythema on the dorsal aspect of his left great toe going along with the right fifth toe which has been problematic all along. He has not taken it because he is concerned about increasing CBGs. He has a transplanted kidney is already on prednisone  5 mg. He would not be a good candidate for NSAIDs. Perhaps colchicine. He is not aware of what his uric acid level is 2/15; his right posterior calf wound seems to be coming in in terms of width. Everything here looks fairly good. No mechanical debridement we have been using Hydrofera Blue. He has had an ablation by vein and vascular. Felt to have adequate arterial supply to this area. The patient got prednisone last week from Dr. Angelique Holm of nephrology. At my urging he actually took it. The area on his left toe was a lot better. The right toe was not as painful but still erythematous with a wound at the tip. We have been using silver alginate here 2/22; right posterior calf seems to be gradually epithelializing. Still a fairly substantial wound. He still has an area on the tip of his right fifth toe which I think was gout related. This is gradually improving. We have been using Unna boots to wrap X-ray I did of the foot last time was again negative there was soft tissue irregularity about the distal fifth digit but no radiographic evidence of bone damage 3/1; right posterior calf seems to be gradually improving however there were areas of hyper granulation. We have  been using Hydrofera Blue. The hyper granulation is mostly evident in the most superior finger shape projection of the wound itself. The area on the right fifth toe also was slough covered and required debridement. Electronic Signature(s) Signed: 11/19/2019 5:45:55 PM By: [name] MD Entered By: [name] on 11/19/2019 10:01:47 -------------------------------------------------------------------------------- Physical Exam Details Patient Name: Date of Service: [name], [name] 11/19/2019 9:00 AM Medical Record [id number] Patient Account Number: [id number] Date of Birth/Sex: Treating RN: Feb 21, 1946 (74 y.o. [name] Primary Care Provider: [name] Other Clinician: Referring Provider:  Treating Provider/Extender:Yassmine Tamm, [name], [name] in Treatment: 15 Constitutional Sitting or standing Blood Pressure is within target range for patient.. Pulse regular and within target range for patient.Marland Kitchen Respirations regular, non-labored and within target range.. Temperature is normal and within the target range for the patient.Marland Kitchen Appears in no distress. Notes Wound exam; right fifth toe is less swollen. Tip of this had some debris on the surface removed with a #5 curette. The large area on the posterior right calf I think looks a lot better. Has hyper granulation over the finger shape projection and the most superior part of this wound that I debrided with an open curette. Bleeding controlled with direct pressure. I am hopeful this will allow epithelialization in this area Electronic Signature(s) Signed: 11/19/2019 5:45:55 PM By: [name] MD Entered By: [name] on 11/19/2019 10:02:47 -------------------------------------------------------------------------------- Physician Orders Details Patient Name: Date of Service: [name], [name] 11/19/2019 9:00 AM Medical Record [id number] Patient Account Number: [id number] Date of Birth/Sex: Treating RN: Feb 27, 1946 (73 y.o. [name] Primary Care Provider: [name] Other Clinician: Referring Provider: Treating Provider/Extender:Gavriella Hearst, [name], [name] in Treatment: 15 Verbal / Phone Orders: No Diagnosis Coding ICD-10 Coding Code Description S80.11XD Contusion of right lower leg, subsequent encounter L97.212 Non-pressure chronic ulcer of right calf with fat layer exposed I87.321 Chronic venous hypertension (idiopathic) with inflammation of right lower extremity E11.622 Type 2 diabetes mellitus with other skin ulcer L97.518 Non-pressure chronic ulcer of other part of right foot with other specified severity Follow-up Appointments Return Appointment in 1  week. Dressing Change Frequency Wound #3 Right,Posterior Lower Leg Change dressing three times week. - 2x by home health on Wednesdays and Fridays, 1x by wound clinic on Mondays Wound #5 Right Toe Fifth Change dressing three times week. - 2x by home health on Wednesdays and Fridays, 1x by wound clinic on Mondays Skin Barriers/Peri-Wound Care Barrier cream Moisturizing lotion Wound Cleansing Clean wound with Normal Saline. - or normal saline on days that dressing is changed May shower with protection. Primary Wound Dressing Wound #3 Right,Posterior Lower Leg Hydrofera Blue - Classic Wound #5 Right Toe Fifth Hydrofera Blue - Classic Secondary Dressing Wound #3 Right,Posterior Lower Leg Foam - foam top bend of foot for protection ABD pad Zetuvit or Kerramax - or Xtrasorb Wound #5 Right Toe Fifth Kerlix/Rolled Gauze Dry Gauze Edema Control Unna Boot to Right Lower Extremity Avoid standing for long periods of time Elevate legs to the level of the heart or above for 30 minutes daily and/or when sitting, a frequency of: - throughout the day Exercise regularly Additional Orders / Instructions Other: - Go to ER if toe/leg gets more red/black, increased pain, fever Other: - Make follow up appt with Kidney doctor regarding uncontrolled swelling [place] [facility] for wound care. - [facility] Electronic Signature(s) Signed: 11/19/2019 5:45:55 PM By: [name] MD Signed: 11/19/2019 5:57:14 PM By: [name] RN, BSN Entered  By: [name] on 11/19/2019 10:00:17 -------------------------------------------------------------------------------- Problem List Details Patient Name: Date of Service: [name], [name] 11/19/2019 9:00 AM Medical Record [id number] Patient Account Number: [id number] Date of Birth/Sex: Treating RN: 1946-08-30 (74 y.o. Jonette Eva, Briant Cedar Primary Care Provider: [name] Other Clinician: Referring Provider:  Treating Provider/Extender:Liban Guedes, [name], [name] in Treatment: 15 Active Problems ICD-10 Evaluated Encounter Code Description Active Date Today Diagnosis S80.11XD Contusion of right lower leg, subsequent encounter 08/06/2019 No Yes L97.212 Non-pressure chronic ulcer of right calf with fat layer 08/06/2019 No Yes exposed I87.321 Chronic venous hypertension (idiopathic) with 08/06/2019 No Yes inflammation of right lower extremity E11.622 Type 2 diabetes mellitus with other skin ulcer 08/06/2019 No Yes L97.518 Non-pressure chronic ulcer of other part of right foot 10/01/2019 No Yes with other specified severity Inactive Problems Resolved Problems Electronic Signature(s) Signed: 11/19/2019 5:45:55 PM By: [name] MD Entered By: [name] on 11/19/2019 10:00:11 -------------------------------------------------------------------------------- Progress Note Details Patient Name: Date of Service: [name], [name] 11/19/2019 9:00 AM Medical Record [id number] Patient Account Number: [id number] Date of Birth/Sex: Treating RN: Sep 15, 1946 (74 y.o. [name] Primary Care Provider: [name] Other Clinician: Referring Provider: Treating Provider/Extender:Wei Poplaski, [name], [name] in Treatment: 15 Subjective History of Present Illness (HPI) [place] HPI Description: 74 year old gentleman who was recently seen by his nephrologist [name], and noted to have a wound on his left lower extremity which was lacerated 2 months ago and now has reopened. The patient's left shin has a ulceration with some exudate but no evidence of infection and he was referred to [place] for further care as it was known that the patient has had some peripheral vascular disease in the past. Past medical history significant for chronic kidney disease, atrial fibrillation, diabetes mellitus,status post kidney transplant in 1983 and 2005, a  week fistula graft placement, status post previous bowel surgery. 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 10/06/2015 -- x-ray of the left tibia and fibula shows no evidence of osteomyelitis. The patient has also had Doppler studies of his extremity and is awaiting the appointment with the vascular surgeon. We have not yet received these reports. 10/13/2015 -- lower extremity venous duplex reflux evaluation shows reflux in the left common femoral vein, left saphenofemoral junction and the proximal greater saphenous vein extending to the proximal calf. There is also reflux in the left proximal to mid small saphenous vein. Arterial duplex studies done showed the resting ABI was not applicable due to tibial artery medial calcification. 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 The right TBI was 1 which is normal and the left ABI was 1 which is normal. Patient has otherwise been doing fine and has been compliant with his dressings. 10/20/2015 -- He was seen by [name] recently for a vascular opinion on 10/15/2015. His left lower extremity venous insufficiency duplex study revealed GSV reflux,SS vein reflux and deep venous reflux in the common femoral vein. His ABIs were non compressible and his TBI on the right was 1.01 and on the left was 0.80. He was asked to continue with the wound care with compressive therapy followed by EVLA of the left GS vein 3 months. He recommended 20-30 mm thigh-high compression stockings and the need for a three-month trial of this. The patient had an Haematologist applied at the  vascular office but he could not tolerate this with a lot of pain and issues with his toes and hence came here on Friday for removal of this and we reapplied a 2 layer compression. 11/10/2015 -- patient still has not purchased his 20-30  mm thigh-high compression stockings as prescribed by [name]. Readmission: 08/08/18 on evaluation today patient presents for readmission concerning a new injury to the left anterior lower extremity. He was previously seen in 2017 here in our clinic. He states that he has done fairly well since that point. Nonetheless he is having at this time some pain but states that he hit this on a table that fell over and actually struck his leg. This appears to have pulled back some of his skin which folded in on itself and is causing some difficulty as far as that is concerned. There does not appear to be any evidence of infection at this time. No fevers, chills, nausea, or vomiting noted at this time. He's been using dressings on his own currently without complication. 08/15/18 on evaluation today patient actually appears to be doing somewhat better in regard to his wound of the lower [name] when compared to the first visit last week. I had to do a much more extensive debridement at that time it does appear that I'm gonna have to perform some debridement today but it does not look to be as extensive by any means. Nonetheless fortunately he does not show any signs of infection he does have discomfort at this site. I believe based on what I'm seeing currently he may benefit from Iodoflex to help keep the wound bed clean. Patient tolerated therapy without complication. Upon evaluation today the patient actually appears to be doing excellent in regard to his left lower extremity ulcer. This is much better than the previous two visits where he had a lot of necrotic tissue around the edge of the wound simply due to the fact that again there was a significant skin tear where the edge had been cleared away prior to reattaching and being able to heal appropriately. He seems to be doing much better at this point. 08/28/18 on evaluation today the patient's wound actually does appear to be showing signs of  improvement. With that being said though he is improving he would likely note even greater improvement if we were able to sharply debride the wound. Nonetheless this caused him to much discomfort he tells me. 09/04/18 on evaluation today patient actually appears to be showing signs of improvement in regard to his left lower extremity ulcer. He has been tolerating the dressing changes including the wrap although he tells me at this point that the burning does last for a couple of days even with just the Iodoflex. I was afraid that this may been part of the issue that he was having with discomfort. It does seem to be the case. Nonetheless he shows no signs of evidence of infection at this time which is good news. No fevers chills noted ADMISSION to [name] 10/05/2018 This is a patient who was cared for in 2017 and again in the fall of this year at our sister clinic and [place]. He actually lives in [place] in [place]. We have been dealing with an apparent traumatic area on the left anterior tibial area. This is been present for the last several months. He was supposed to be using Iodoflex Kerlix and Coban however he was hospitalized from 09/05/2018 through 09/11/2018 with delirium secondary to  pneumonia. Since then he is only been putting Vaseline gauze on this without compression. He also has a more recent skin tear on the dorsal right hand that may have only happened in the last week. The patient had arterial studies done in 2017 in January which was 3 years ago. At that point he had noncompressible ABIs but really quite good TBI's both normal. Triphasic waveforms on the right monophasic at the left posterior tibial but triphasic at the left dorsalis pedis. His ABIs in our clinic today were both noncompressible 1/23; the patient has wounds on the right dorsal hand just distal to the wrist and on the left anterior lower extremity. Both of these look very healthy he is  using Hydrofera Blue 1/30; left anterior lower extremity wound much smaller. Healthy looking surface. The laceration area just distal to the wrist on the dorsal hand on the right is also just about closed I used Hydrofera Blue here 2/6; left anterior lower extremity wound is much smaller but still open. The laceration area just distal to the wrist on the dorsal hand is fully epithelialized. 2/13; the patient's anterior lower extremity wound is closed. The laceration just distal to the wrist on the dorsal hand is also fully epithelialized and closed. The patient has external compression stockings which I think are 20/30 READMISSION 08/06/2019 [name] is a 74 year old man with had several times previously in our clinic. He is a diabetic with a history of chronic renal insufficiency status post kidney transplant in 1983 and again in 2005. He was then in 2017 with a laceration on the left lower extremity. He was worked up at the time with arterial studies and reflux studies. The arterial studies showed ABIs to be noncompressible but TBI's were within normal limits. I do not have the reflux studies at the moment. He was also sent here in 2019 with a left lower extremity wound and then again in 2020 with left lower extremity trauma a skin tear on the wrist. He was discharged with 20/30 stockings identified from myself that that might not be enough compression. Nevertheless he states he was wearing these fairly reliably. In September he had a fall with a substantial bruise in the area of the wound. He says he saw orthopedics and they told him there was some muscle strain sometime it later this opened into a wound. He has a fairly substantial wound on the right posterior calf. Satellite areas around this including medially and posteriorly. He has not worn his stockings since the injury Past medical history; includes chronic renal failure secondary to diabetes with kidney transplant x2,  atrial fibrillation, heart failure with preserved ejection fraction, coronary artery disease. ABIs on the right in our clinic were once again noncompressible 08/13/2019 on evaluation today patient appears to be doing decently well with regard to his wound compared to last week's evaluation. Unfortunately he is still having a lot of discomfort at this point which is I think in some part due to the 3 layer compression wrap which is a little bit stronger I think for him. When he was here before we actually utilized a Kerlix and Coban wrap which he states seemed to got a little bit better. Nonetheless I think we can probably drop back to this in light of the discomfort that he had. Nonetheless the pain was not really right around the wound itself as much as it was around the ankle in particular. The Iodoflex does seem to have done well for him As  the wound is appearing somewhat better today which is excellent news. 11/30; still complaining of a lot of pain. Apparently arterial studies I ordered 2 weeks ago are below. I do not believe we have an appointment with vein and vascular as of yet; ABI Findings: +---------+------------------+-----+----------+--------+ Right Rt Pressure (mmHg)IndexWaveform Comment  +---------+------------------+-----+----------+--------+ PTA >254 1.50 monophasic  +---------+------------------+-----+----------+--------+ DP >254 1.50 monophasic  +---------+------------------+-----+----------+--------+ Great Toe68 0.40 Abnormal   +---------+------------------+-----+----------+--------+ +---------+------------------+-----+----------+-------+ Left Lt Pressure (mmHg)IndexWaveform Comment +---------+------------------+-----+----------+-------+ Brachial 169     +---------+------------------+-----+----------+-------+ PTA >254 1.50 monophasic  +---------+------------------+-----+----------+-------+ DP >254 1.50 monophasic   +---------+------------------+-----+----------+-------+ Great Toe40 0.24 Abnormal   +---------+------------------+-----+----------+-------+ Pedal arteries appear hyperemic. Patient refused Brachial pressure in the right arm. Summary: Right: Resting right ankle-brachial index indicates noncompressible right lower extremity arteries. The right toe- brachial index is abnormal. Left: Resting left ankle-brachial index indicates noncompressible left lower extremity arteries. The left toe-brachial index is abnormal. He is also having considerably more swelling in his left calf. This was not there when I last saw him 2 weeks ago. He tells me that some of the home health compression wraps have been slipping down and that may be the issue here however a month I am uncertain 57/8; sees vascular on December 22. Still complaining of a lot of pain. DVT study I did last time was negative for DVT 12/14; still complaining of pain which if this is arterial is certainly claudication and rest keeps him uncomfortable at night. He has an appointment with vein and vascular on December 22. Wound surface is better using Iodoflex. Once the surface of this is satisfactory and we have exhausted the vascular route. Perhaps an advanced treatment option. He has a configuration of the venous ulceration although his arterial studies are not very good. The other issue is the patient has a transplanted kidney. This will make angiography difficult and challenging issue 12/21; still complaining of pain and drainage. We are using Iodoflex on the wound under compression. He sees Dr. Donnetta Hutching tomorrow to evaluate his noninvasive studies noted above. He has a transplanted kidney further complicating the options for angiography. 12/28; still using Iodoflex under compression. I have Dr. Luther Parody note from 12/22. he noted arterial studies revealing monophasic waveforms at the pedal vessels bilaterally and calcified vessels making  the ABI unreliable. He did not comment on the reduced TBI's. He felt these were venous wounds based on the palpable dorsalis pedis pulse. 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 And they arranged for formal venous duplex with reflux studies in the next several weeks. Follow-up with either Scot Dock or Dr. Oneida Alar 09/24/2019 still using Iodoflex under compression. He has an appointment with Dr. Doren Custard on 1/7 with regards to his venous disease. The patient was not felt to have a primary arterial problem for the nonhealing of his wound. We did attempt to wrap him with 3 layer when he first came into the clinic he complained of a lot of pain in the ankle although this may have been because the dressing fell down somewhat. He has far too edema fluid in the right leg for a good prognosis about healing this wound He comes in today with an excoriation on the bottom part of his right fifth toe. He thinks he may have done this taking off his clothes and that something got caught on the toe. There is no open wound however the toe itself is very painful 1/11; we are using Iodoflex under compression. The wound bed is clean. He went on to see Dr.  Dixon on 09/27/2019 he again feels that the patient's arterial supply is adequate. He feels that he might benefit from right greater saphenous vein ablation for a venous ulcer. In the meantime the area that he was complaining about last week on the right fifth toe. An x-ray that I ordered showed marked soft tissue swelling along the distal aspect of the right foot but there was no evidence of osteomyelitis. He comes in today with the fifth toenail just about coming off. He has black eschar underneath the toe on the plantar aspect. The toe was swollen red and very painful. In the right setting this could be a significant soft tissue infection versus ischemia of the toe itself. He did not show this to Dr. Doren Custard 1/18; I am using Iodoflex under compression a large wound  on the right posterior calf. He had his right greater saphenous vein ablation by Dr. Doren Custard although I am not sure that is the only problem here. ooThe right fifth toe which was possibly trauma 2 weeks ago continues to be exceptionally painful with a necrotic tip. Maybe not quite as swollen. I started him empirically on Augmentin 5/125 1 p.o. twice daily last week after discussing this with Dr. Loletha Grayer of nephrology. Perhaps somewhat better this week but not as good as I was hoping. A plain x-ray was negative. He comes in today with an area on the medial right calf that was blistered and now open. In looking at things he appears to be systemically fluid volume overloaded. He has a transplanted kidney. He states he takes his Lasix variably when he has appointments he tries not to take it the later I am really not certain if he takes this reliably. However he has far too much edema fluid in the right leg to easily heal this wound and he appears to be developing blisters medially to form additional wounds 1/25; his CO2 angiogram was done by Dr. Doren Custard last week. This showed the proximal arteries all to be patent. On the right side the common femoral deep femoral superficial femoral popliteal anterior tibial and peroneal arteries were patent. The posterior tibial was occluded but reconstituted distally via collaterals from the peroneal artery he was felt he should have enough blood flow to heal his wounds including the right fifth toe. The right fifth toe looks better however he is still complaining of a lot of pain. The large area which is a venous ulceration posteriorly has a better looking surface I think we can switch to Hydrofera Blue today 2/1; the patient's original wounds on the right posterior medial calf has come down in width however superior to this he has new denuded areas and I am concerned we simply do not have enough edema control. He has already undergone a right greater saphenous ablation.  He had a CO2 angiogram done by Dr. Doren Custard and the comment was that we should have enough blood flow to heal the venous wound however we simply do not seem to have enough edema control with 3 layer compression. The patient is status post kidney transplant although his exact renal function is not really clear. Nor am i sure what dose of diuretic he is supposed to be on. He also has the area on the right fifth toe which was unclear etiology but I think became secondarily infected I gave him 2 weeks of antibiotics for this and this seems to have settled down he still has a black eschar over the tip of the toe. X-ray was  negative for fracture. He says he has a history of gout. 2/8; the patient's wound on the right posterior calf is about the same. The superficial area medially also about the same. He got a prescription for prednisoneo Gout after he developed erythema on the dorsal aspect of his left great toe going along with the right fifth toe which has been problematic all along. He has not taken it because he is concerned about increasing CBGs. He has a transplanted kidney is already on prednisone 5 mg. He would not be a good candidate for NSAIDs. Perhaps colchicine. He is not aware of what his uric acid level is 2/15; his right posterior calf wound seems to be coming in in terms of width. Everything here looks fairly good. No mechanical debridement we have been using Hydrofera Blue. He has had an ablation by vein and vascular. Felt to have adequate arterial supply to this area. The patient got prednisone last week from Dr. Angelique Holm of nephrology. At my urging he actually took it. The area on his left toe was a lot better. The right toe was not as painful but still erythematous with a wound at the tip. We have been using silver alginate here 2/22; right posterior calf seems to be gradually epithelializing. Still a fairly substantial wound. He still has an area on the tip of his right fifth toe  which I think was gout related. This is gradually improving. We have been using Unna boots to wrap X-ray I did of the foot last time was again negative there was soft tissue irregularity about the distal fifth digit but no radiographic evidence of bone damage 3/1; right posterior calf seems to be gradually improving however there were areas of hyper granulation. We have been using Hydrofera Blue. The hyper granulation is mostly evident in the most superior finger shape projection of the wound itself. ooThe area on the right fifth toe also was slough covered and required debridement. Objective Constitutional Sitting or standing Blood Pressure is within target range for patient.. Pulse regular and within target range for patient.Marland Kitchen Respirations regular, non-labored and within target range.. Temperature is normal and within the target range for the patient.Marland Kitchen Appears in no distress. Vitals Time Taken: 9:25 AM, Height: 67 in, Weight: 190 lbs, BMI: 29.8, Temperature: 98 F, Pulse: 45 bpm, Respiratory Rate: 20 breaths/min, Blood Pressure: 133/64 mmHg, Capillary Blood Glucose: 97 mg/dl. General Notes: patient stated CBG this morning was 97 General Notes: Wound exam; right fifth toe is less swollen. Tip of this had some debris on the surface removed with a #5 curette. ooThe large area on the posterior right calf I think looks a lot better. Has hyper granulation over the finger shape projection and the most superior part of this wound that I debrided with an open curette. Bleeding controlled with direct pressure. I am hopeful this will allow epithelialization in this area Integumentary (Hair, Skin) Wound #3 status is Open. Original cause of wound was Trauma. The wound is located on the Right,Posterior Lower Leg. The wound measures 15cm length x 5cm width x 0.3cm depth; 58.905cm^2 area and 17.671cm^3 volume. There is Fat Layer (Subcutaneous Tissue) Exposed exposed. There is no tunneling or undermining  noted. There is a large amount of serosanguineous drainage noted. The wound margin is thickened. There is large (67-100%) pink, hyper - granulation within the wound bed. There is a small (1-33%) amount of necrotic tissue within the wound bed including Adherent Slough. Wound #5 status is Open. Original cause of  wound was Blister. The wound is located on the Right Toe Fifth. The wound measures 0.6cm length x 1cm width x 0.1cm depth; 0.471cm^2 area and 0.047cm^3 volume. There is no tunneling or undermining noted. There is a small amount of serous drainage noted. The wound margin is distinct with the outline attached to the wound base. There is small (1-33%) pink granulation within the wound bed. There is a large (67-100%) amount of necrotic tissue within the wound bed including Adherent Slough. Wound #6 status is Healed - Epithelialized. Original cause of wound was Blister. The wound is located on the Right,Medial Lower Leg. The wound measures 0cm length x 0cm width x 0cm depth; 0cm^2 area and 0cm^3 volume. There is no tunneling or undermining noted. There is a none present amount of drainage noted. The wound margin is distinct with the outline attached to the wound base. There is no granulation within the wound bed. There is no necrotic tissue within the wound bed. 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 There was a Excisional Skin/Subcutaneous Tissue Debridement with a total area of 16 sq cm  performed by Ricard Dillon., MD. With the following instrument(s): Curette to remove Viable and Non-Viable tissue/material. Material removed includes Subcutaneous Tissue after achieving pain control using Other (Benzocaine 20%). No specimens were taken. A time out was conducted at 09:55, prior to the start of the procedure. A Moderate amount of bleeding was controlled with Silver Nitrate. The procedure was tolerated well with a pain level of 3 throughout and a pain level of 1 following the procedure. Post Debridement Measurements: 15cm length x 5cm width x 0.3cm depth; 17.671cm^3 volume. Character of Wound/Ulcer Post Debridement is improved. Severity of Tissue Post Debridement is: Fat layer exposed. Post procedure Diagnosis Wound #3: Same as Pre-Procedure Pre-procedure diagnosis of Wound #3 is a Venous Leg Ulcer located on the Right,Posterior Lower Leg . There was a Haematologist Compression Therapy Procedure by [name], RN. Post procedure Diagnosis Wound #3: Same as Pre-Procedure Wound #5 Pre-procedure diagnosis of Wound #5 is a Diabetic Wound/Ulcer of the Lower Extremity located on the Right Toe Fifth .Severity of Tissue Pre Debridement is: Fat layer exposed. There was a Excisional Skin/Subcutaneous Tissue Debridement with a total area of 0.6 sq cm performed by Ricard Dillon., MD. With the following instrument(s): Curette to remove Viable and Non-Viable tissue/material. Material removed includes Subcutaneous Tissue and Slough and after achieving pain control using Other (Benzocaine 20%). No specimens were taken. A time out was conducted at 09:55, prior to the start of the procedure. A Minimum amount of bleeding was controlled with Pressure. The procedure was tolerated well with a pain level of 3 throughout and a pain level of 1 following the procedure. Post Debridement Measurements: 0.6cm length x 1cm width x 0.1cm depth; 0.047cm^3 volume. Character of Wound/Ulcer Post Debridement  is improved. Severity of Tissue Post Debridement is: Fat layer exposed. Post procedure Diagnosis Wound #5: Same as Pre-Procedure Plan Follow-up Appointments: Return Appointment in 1 week. Dressing Change Frequency: Wound #3 Right,Posterior Lower Leg: Change dressing three times  week. - 2x by home health on Wednesdays and Fridays, 1x by wound clinic on Mondays Wound #5 Right Toe Fifth: Change dressing three times week. - 2x by home health on Wednesdays and Fridays, 1x by wound clinic on Mondays Skin Barriers/Peri-Wound Care: Barrier cream Moisturizing lotion Wound Cleansing: Clean wound with Normal Saline. - or normal saline on days that dressing is changed May shower with protection. Primary Wound Dressing: Wound #3 Right,Posterior Lower Leg: Hydrofera Blue - Classic Wound #5 Right Toe Fifth: Hydrofera Blue - Classic Secondary Dressing: Wound #3 Right,Posterior Lower Leg: Foam - foam top bend of foot for protection ABD pad Zetuvit or Kerramax - or Xtrasorb Wound #5 Right Toe Fifth: Kerlix/Rolled Gauze Dry Gauze Edema Control: Unna Boot to Right Lower Extremity Avoid standing for long periods of time Elevate legs to the level of the heart or above for 30 minutes daily and/or when sitting, a frequency of: - throughout the day Exercise regularly Additional Orders / Instructions: Other: - Go to ER if toe/leg gets more red/black, increased pain, fever Other: - Make follow up appt with Kidney doctor regarding uncontrolled swelling Home Health: [facility] for wound care. - [facility]. I am continuing with the Hydrofera Blue to both areas which is a change from silver alginate to the toe 2. Still under the same compression Electronic Signature(s) Signed: 11/19/2019 5:45:55 PM By: [name] MD Entered By: [name] on 11/19/2019 10:03:24 -------------------------------------------------------------------------------- SuperBill  Details Patient Name: Date of Service: [place], [place] 11/19/2019 Medical Record [id number] Patient Account Number: [id number] Date of Birth/Sex: Treating RN: 24-Apr-1946 (73 y.o. 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 E11.622 Type 2 diabetes mellitus with other skin ulcer L97.518 Non-pressure chronic ulcer of other part of right foot with other specified severity Facility Procedures CPT4 Code: [id number] Description: 84132 - DEB SUBQ TISSUE 20 SQ CM/< ICD-10 Diagnosis Description L97.212 Non-pressure chronic ulcer of right calf with fat layer S80.11XD Contusion of right lower leg, subsequent encounter Modifier: exposed Quantity: 1 Physician Procedures CPT4 Code Description: [id number] 11042 - WC PHYS SUBQ TISS 20 SQ CM ICD-10 Diagnosis Description L97.212 Non-pressure chronic ulcer of right calf with fat layer S80.11XD Contusion of right lower leg, subsequent encounter Modifier: exposed Quantity: 1 Electronic Signature(s) Signed: 11/19/2019 5:45:55 PM By: [name] MD Entered By: [name] on 11/19/2019 10:04:08

## 2019-11-22 ENCOUNTER — Ambulatory Visit (HOSPITAL_COMMUNITY)
Admission: RE | Admit: 2019-11-22 | Discharge: 2019-11-22 | Disposition: A | Discharge status: Home / Self Care | Payer: Medicare Other | Source: Ambulatory Visit | Attending: Nephrology | Admitting: Nephrology

## 2019-11-22 ENCOUNTER — Other Ambulatory Visit: Payer: Self-pay

## 2019-11-22 DIAGNOSIS — N185 Chronic kidney disease, stage 5: Secondary | ICD-10-CM | POA: Diagnosis not present

## 2019-11-22 DIAGNOSIS — D631 Anemia in chronic kidney disease: Secondary | ICD-10-CM | POA: Diagnosis not present

## 2019-11-22 LAB — HEMOGLOBIN AND HEMATOCRIT, BLOOD
HCT: 29.3 % — ABNORMAL LOW (ref 39.0–52.0)
Hemoglobin: 8.5 g/dL — ABNORMAL LOW (ref 13.0–17.0)

## 2019-11-22 MED ORDER — DARBEPOETIN ALFA 150 MCG/0.3ML IJ SOSY: 120.0000 ug | PREFILLED_SYRINGE | INTRAMUSCULAR | Status: DC

## 2019-11-22 MED ORDER — DARBEPOETIN ALFA 200 MCG/0.4ML IJ SOSY
200.0000 ug | PREFILLED_SYRINGE | INTRAMUSCULAR | Status: DC
  Filled 2019-11-22: qty 0.4

## 2019-11-22 MED ORDER — DARBEPOETIN ALFA 200 MCG/0.4ML IJ SOSY
200.0000 ug | PREFILLED_SYRINGE | INTRAMUSCULAR | Status: DC
  Administered 2019-11-22: 200 ug via SUBCUTANEOUS
  Filled 2019-11-22: qty 0.4

## 2019-11-22 NOTE — Discharge Instructions (Signed)
Darbepoetin Alfa injection What is this medicine? DARBEPOETIN ALFA (dar be POE e tin AL fa) helps your body make more red blood cells. It is used to treat anemia caused by chronic kidney failure and chemotherapy. This medicine may be used for other purposes; ask your health care provider or pharmacist if you have questions. COMMON BRAND NAME(S): Aranesp What should I tell my health care provider before I take this medicine? They need to know if you have any of these conditions:  blood clotting disorders or history of blood clots  cancer patient not on chemotherapy  cystic fibrosis  heart disease, such as angina, heart failure, or a history of a heart attack  hemoglobin level of 12 g/dL or greater  high blood pressure  low levels of folate, iron, or vitamin B12  seizures  an unusual or allergic reaction to darbepoetin, erythropoietin, albumin, hamster proteins, latex, other medicines, foods, dyes, or preservatives  pregnant or trying to get pregnant  breast-feeding How should I use this medicine? This medicine is for injection into a vein or under the skin. It is usually given by a health care professional in a hospital or clinic setting. If you get this medicine at home, you will be taught how to prepare and give this medicine. Use exactly as directed. Take your medicine at regular intervals. Do not take your medicine more often than directed. It is important that you put your used needles and syringes in a special sharps container. Do not put them in a trash can. If you do not have a sharps container, call your pharmacist or healthcare provider to get one. A special MedGuide will be given to you by the pharmacist with each prescription and refill. Be sure to read this information carefully each time. Talk to your pediatrician regarding the use of this medicine in children. While this medicine may be used in children as young as 1 month of age for selected conditions, precautions do  apply. Overdosage: If you think you have taken too much of this medicine contact a poison control center or emergency room at once. NOTE: This medicine is only for you. Do not share this medicine with others. What if I miss a dose? If you miss a dose, take it as soon as you can. If it is almost time for your next dose, take only that dose. Do not take double or extra doses. What may interact with this medicine? Do not take this medicine with any of the following medications:  epoetin alfa This list may not describe all possible interactions. Give your health care provider a list of all the medicines, herbs, non-prescription drugs, or dietary supplements you use. Also tell them if you smoke, drink alcohol, or use illegal drugs. Some items may interact with your medicine. What should I watch for while using this medicine? Your condition will be monitored carefully while you are receiving this medicine. You may need blood work done while you are taking this medicine. This medicine may cause a decrease in vitamin B6. You should make sure that you get enough vitamin B6 while you are taking this medicine. Discuss the foods you eat and the vitamins you take with your health care professional. What side effects may I notice from receiving this medicine? Side effects that you should report to your doctor or health care professional as soon as possible:  allergic reactions like skin rash, itching or hives, swelling of the face, lips, or tongue  breathing problems  changes in   vision  chest pain  confusion, trouble speaking or understanding  feeling faint or lightheaded, falls  high blood pressure  muscle aches or pains  pain, swelling, warmth in the leg  rapid weight gain  severe headaches  sudden numbness or weakness of the face, arm or leg  trouble walking, dizziness, loss of balance or coordination  seizures (convulsions)  swelling of the ankles, feet, hands  unusually weak or  tired Side effects that usually do not require medical attention (report to your doctor or health care professional if they continue or are bothersome):  diarrhea  fever, chills (flu-like symptoms)  headaches  nausea, vomiting  redness, stinging, or swelling at site where injected This list may not describe all possible side effects. Call your doctor for medical advice about side effects. You may report side effects to FDA at 1-800-FDA-1088. Where should I keep my medicine? Keep out of the reach of children. Store in a refrigerator between 2 and 8 degrees C (36 and 46 degrees F). Do not freeze. Do not shake. Throw away any unused portion if using a single-dose vial. Throw away any unused medicine after the expiration date. NOTE: This sheet is a summary. It may not cover all possible information. If you have questions about this medicine, talk to your doctor, pharmacist, or health care provider.  2020 Elsevier/Gold Standard (2017-09-21 16:44:20)  

## 2019-11-22 NOTE — Progress Notes (Signed)
PATIENT CARE CENTER NOTE  Diagnosis: Anemia associated with chronic renal failure   Provider: [name] MD   Procedure: Aranesp injection   Note: Patient received sub q Aranesp injection in the left arm. No adverse reaction noted. Pre-injection hemoglobin was 8.5 and BP 135/64. Discharge instructions given to the patient. Alert, oriented and ambulatory at discharge.

## 2019-11-26 ENCOUNTER — Other Ambulatory Visit: Payer: Self-pay

## 2019-11-26 ENCOUNTER — Encounter (HOSPITAL_BASED_OUTPATIENT_CLINIC_OR_DEPARTMENT_OTHER): Payer: Medicare Other | Admitting: Internal Medicine

## 2019-11-26 DIAGNOSIS — S90121A Contusion of right lesser toe(s) without damage to nail, initial encounter: Secondary | ICD-10-CM | POA: Diagnosis not present

## 2019-11-26 NOTE — Progress Notes (Signed)
Michael, [name] ([id number]) Visit Report for 11/26/2019 HPI Details Patient Name: Date of Service: [name], [name] 11/26/2019 9:45 AM Medical Record [id number] Patient Account Number: [id number] Date of Birth/Sex: Treating RN: September 24, 1945 (74 y.o. [name] Primary Care Provider: [name] Other Clinician: Referring Provider: Treating Provider/Extender:Kiley Solimine, [name], [name] in Treatment: 16 History of Present Illness HPI Description: [name] HPI Description: 74 year old gentleman who was recently seen by his nephrologist [name], and noted to have a wound on his left lower extremity which was lacerated 2 months ago and now has reopened. The patient's left shin has a ulceration with some exudate but no evidence of infection and he was referred to [place] for further care as it was known that the patient has had some peripheral vascular disease in the past. Past medical history significant for chronic kidney disease, atrial fibrillation, diabetes mellitus,status post kidney transplant in 1983 and 2005, a week fistula graft placement, status post previous bowel surgery. 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 10/06/2015 -- x-ray of the left tibia and fibula shows no evidence of osteomyelitis. The patient has also had Doppler studies of his extremity and is awaiting the appointment with the vascular surgeon. We have not yet received these reports. 10/13/2015 -- lower extremity venous duplex reflux evaluation shows reflux in the left common femoral vein, left saphenofemoral junction and the proximal greater saphenous vein extending to the proximal calf. There is also reflux in the left proximal to mid small saphenous vein. Arterial duplex studies done showed the resting ABI was not applicable due to tibial artery medial calcification. The left ABI was 0.8 using the Doppler dorsalis pedis indicating mild  arterial occlusive disease at rest with the posterior tibial artery noted to be noncompressible. The right TBI was 1 which is normal and the left ABI was 1 which is normal. Patient has otherwise been doing fine and has been compliant with his dressings. 10/20/2015 -- He was seen by [name] recently for a vascular opinion on 10/15/2015. His left lower extremity venous insufficiency duplex study revealed GSV reflux,SS vein reflux and deep venous reflux in the common femoral vein. His ABIs were non compressible and his TBI on the right was 1.01 and on the left was 0.80. He was asked to continue with the wound care with compressive therapy followed by EVLA of the left GS vein 3 months. He recommended 20-30 mm thigh-high compression stockings and the need for a three-month trial of this. The patient had an Unna boot applied at the vascular office but he could not tolerate this with a lot of pain and issues with his toes and hence came here on Friday for removal of this and we reapplied a 2 layer compression. 11/10/2015 -- patient still has not purchased his 20-30 mm thigh-high compression stockings as prescribed by [name]. Readmission: 08/08/18 on evaluation today patient presents for readmission concerning a new injury to the left anterior lower extremity. He was previously seen in 2017 here in our clinic. He states that he has done fairly well since that point. Nonetheless he is having at this time some pain but states that he hit this on a table that fell over and actually struck his leg. This appears to have pulled back some of his skin which folded in on itself and is causing some difficulty as far as that is concerned. There does not appear to be any evidence of  infection at this time. No fevers, chills, nausea, or vomiting noted at this time. He's been using dressings on his own currently without complication. 08/15/18 on evaluation today patient actually appears to be doing somewhat  better in regard to his wound of the lower [name] when compared to the first visit last week. I had to do a much more extensive debridement at that time it does appear that I'm gonna have to perform some debridement today but it does not look to be as extensive by any means. Nonetheless fortunately he does not show any signs of infection he does have discomfort at this site. I believe based on what I'm seeing currently he may benefit from Iodoflex to help keep the wound bed clean. Patient tolerated therapy without complication. Upon evaluation today the patient actually appears to be doing excellent in regard to his left lower extremity ulcer. This is much better than the previous two visits where he had a lot of necrotic tissue around the edge of the wound simply due to the fact that again there was a significant skin tear where the edge had been cleared away prior to reattaching and being able to heal appropriately. He seems to be doing much better at this point. 08/28/18 on evaluation today the patient's wound actually does appear to be showing signs of improvement. With that being said though he is improving he would likely note even greater improvement if we were able to sharply debride the wound. Nonetheless this caused him to much discomfort he tells me. 09/04/18 on evaluation today patient actually appears to be showing signs of improvement in regard to his left lower extremity ulcer. He has been tolerating the dressing changes including the wrap although he tells me at this point that the burning does last for a couple of days even with just the Iodoflex. I was afraid that this may been part of the issue that he was having with discomfort. It does seem to be the case. Nonetheless he shows no signs of evidence of infection at this time which is good news. No fevers chills noted ADMISSION to [name] 10/05/2018 This is a patient who was cared for in 2017 and again in the  fall of this year at our sister clinic and [facility]. He actually lives in [place] in [place]. We have been dealing with an apparent traumatic area on the left anterior tibial area. This is been present for the last several months. He was supposed to be using Iodoflex Kerlix and Coban however he was hospitalized from 09/05/2018 through 09/11/2018 with delirium secondary to pneumonia. Since then he is only been putting Vaseline gauze on this without compression. He also has a more recent skin tear on the dorsal right hand that may have only happened in the last week. The patient had arterial studies done in 2017 in January which was 3 years ago. At that point he had noncompressible ABIs but really quite good TBI's both normal. Triphasic waveforms on the right monophasic at the left posterior tibial but triphasic at the left dorsalis pedis. His ABIs in our clinic today were both noncompressible 1/23; the patient has wounds on the right dorsal hand just distal to the wrist and on the left anterior lower extremity. Both of these look very healthy he is using Hydrofera Blue 1/30; left anterior lower extremity wound much smaller. Healthy looking surface. The laceration area just distal to the wrist on the dorsal hand on the right is also just  about closed I used Hydrofera Blue here 2/6; left anterior lower extremity wound is much smaller but still open. The laceration area just distal to the wrist on the dorsal hand is fully epithelialized. 2/13; the patient's anterior lower extremity wound is closed. The laceration just distal to the wrist on the dorsal hand is also fully epithelialized and closed. The patient has external compression stockings which I think are 20/30 READMISSION 08/06/2019 Mr. Devino is a 74 year old man with had several times previously in our clinic. He is a diabetic with a history of chronic renal insufficiency status post kidney transplant in 1983 and again in 2005. He was  then in 2017 with a laceration on the left lower extremity. He was worked up at the time with arterial studies and reflux studies. The arterial studies showed ABIs to be noncompressible but TBI's were within normal limits. I do not have the reflux studies at the moment. He was also sent here in 2019 with a left lower extremity wound and then again in 2020 with left lower extremity trauma a skin tear on the wrist. He was discharged with 20/30 stockings identified from myself that that might not be enough compression. Nevertheless he states he was wearing these fairly reliably. In September he had a fall with a substantial bruise in the area of the wound. He says he saw orthopedics and they told him there was some muscle strain sometime it later this opened into a wound. He has a fairly substantial wound on the right posterior calf. Satellite areas around this including medially and posteriorly. He has not worn his stockings since the injury Past medical history; includes chronic renal failure secondary to diabetes with kidney transplant x2, atrial fibrillation, heart failure with preserved ejection fraction, coronary artery disease. ABIs on the right in our clinic were once again noncompressible 08/13/2019 on evaluation today patient appears to be doing decently well with regard to his wound compared to last week's evaluation. Unfortunately he is still having a lot of discomfort at this point which is I think in some part due to the 3 layer compression wrap which is a little bit stronger I think for him. When he was here before we actually utilized a Kerlix and Coban wrap which he states seemed to got a little bit better. Nonetheless I think we can probably drop back to this in light of the discomfort that he had. Nonetheless the pain was not really right around the wound itself as much as it was around the ankle in particular. The Iodoflex does seem to have done well for him As the wound is  appearing somewhat better today which is excellent news. 11/30; still complaining of a lot of pain. Apparently arterial studies I ordered 2 weeks ago are below. I do not believe we have an appointment with vein and vascular as of yet; ABI Findings: +---------+------------------+-----+----------+--------+ Right Rt Pressure (mmHg)IndexWaveform Comment  +---------+------------------+-----+----------+--------+ PTA >254 1.50 monophasic  +---------+------------------+-----+----------+--------+ DP >254 1.50 monophasic  +---------+------------------+-----+----------+--------+ Great Toe68 0.40 Abnormal   +---------+------------------+-----+----------+--------+ +---------+------------------+-----+----------+-------+ Left Lt Pressure (mmHg)IndexWaveform Comment +---------+------------------+-----+----------+-------+ Brachial 169     +---------+------------------+-----+----------+-------+ PTA >254 1.50 monophasic  +---------+------------------+-----+----------+-------+ DP >254 1.50 monophasic  +---------+------------------+-----+----------+-------+ Great Toe40 0.24 Abnormal   +---------+------------------+-----+----------+-------+ Pedal arteries appear hyperemic. Patient refused Brachial pressure in the right arm. Summary: Right: Resting right ankle-brachial index indicates noncompressible right lower extremity arteries. The right toe- brachial index is abnormal. Left: Resting left ankle-brachial index indicates noncompressible left lower extremity arteries. The left toe-brachial index is abnormal.  He is also having considerably more swelling in his left calf. This was not there when I last saw him 2 weeks ago. He tells me that some of the home health compression wraps have been slipping down and that may be the issue here however a month I am uncertain 06/3; sees vascular on December 22. Still complaining of a lot of pain. DVT study I did  last time was negative for DVT 12/14; still complaining of pain which if this is arterial is certainly claudication and rest keeps him uncomfortable at night. He has an appointment with vein and vascular on December 22. Wound surface is better using Iodoflex. Once the surface of this is satisfactory and we have exhausted the vascular route. Perhaps an advanced treatment option. He has a configuration of the venous ulceration although his arterial studies are not very good. The other issue is the patient has a transplanted kidney. This will make angiography difficult and challenging issue 12/21; still complaining of pain and drainage. We are using Iodoflex on the wound under compression. He sees Dr. Donnetta Hutching tomorrow to evaluate his noninvasive studies noted above. He has a transplanted kidney further complicating the options for angiography. 12/28; still using Iodoflex under compression. I have Dr. Luther Parody note from 12/22. he noted arterial studies revealing monophasic waveforms at the pedal vessels bilaterally and calcified vessels making the ABI unreliable. He did not comment on the reduced TBI's. He felt these were venous wounds based on the palpable dorsalis pedis pulse. 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 And they arranged for formal venous duplex with reflux studies in the next several weeks. Follow-up with either Scot Dock or Dr. Oneida Alar 09/24/2019 still using Iodoflex under compression. He has an appointment with Dr. Doren Custard on 1/7 with regards to his venous disease. The patient was not felt to have a primary arterial problem for the nonhealing of his wound. We did attempt to wrap him with 3 layer when he first came into the clinic he complained of a lot of pain in the ankle although this may have been because the dressing fell down somewhat. He has far too edema fluid in the right leg for a good prognosis about healing this wound He comes in today with an excoriation on the bottom part  of his right fifth toe. He thinks he may have done this taking off his clothes and that something got caught on the toe. There is no open wound however the toe itself is very painful 1/11; we are using Iodoflex under compression. The wound bed is clean. He went on to see Dr. Doren Custard on 09/27/2019 he again feels that the patient's arterial supply is adequate. He feels that he might benefit from right greater saphenous vein ablation for a venous ulcer. In the meantime the area that he was complaining about last week on the right fifth toe. An x-ray that I ordered showed marked soft tissue swelling along the distal aspect of the right foot but there was no evidence of osteomyelitis. He comes in today with the fifth toenail just about coming off. He has black eschar underneath the toe on the plantar aspect. The toe was swollen red and very painful. In the right setting this could be a significant soft tissue infection versus ischemia of the toe itself. He did not show this to Dr. Doren Custard 1/18; I am using Iodoflex under compression a large wound on the right posterior calf. He had his right greater saphenous vein ablation by Dr. Doren Custard  although I am not sure that is the only problem here. The right fifth toe which was possibly trauma 2 weeks ago continues to be exceptionally painful with a necrotic tip. Maybe not quite as swollen. I started him empirically on Augmentin 5/125 1 p.o. twice daily last week after discussing this with Dr. Loletha Grayer of nephrology. Perhaps somewhat better this week but not as good as I was hoping. A plain x-ray was negative. He comes in today with an area on the medial right calf that was blistered and now open. In looking at things he appears to be systemically fluid volume overloaded. He has a transplanted kidney. He states he takes his Lasix variably when he has appointments he tries not to take it the later I am really not certain if he takes this reliably. However he has far too much  edema fluid in the right leg to easily heal this wound and he appears to be developing blisters medially to form additional wounds 1/25; his CO2 angiogram was done by Dr. Doren Custard last week. This showed the proximal arteries all to be patent. On the right side the common femoral deep femoral superficial femoral popliteal anterior tibial and peroneal arteries were patent. The posterior tibial was occluded but reconstituted distally via collaterals from the peroneal artery he was felt he should have enough blood flow to heal his wounds including the right fifth toe. The right fifth toe looks better however he is still complaining of a lot of pain. The large area which is a venous ulceration posteriorly has a better looking surface I think we can switch to Hydrofera Blue today 2/1; the patient's original wounds on the right posterior medial calf has come down in width however superior to this he has new denuded areas and I am concerned we simply do not have enough edema control. He has already undergone a right greater saphenous ablation. He had a CO2 angiogram done by Dr. Doren Custard and the comment was that we should have enough blood flow to heal the venous wound however we simply do not seem to have enough edema control with 3 layer compression. The patient is status post kidney transplant although his exact renal function is not really clear. Nor am i sure what dose of diuretic he is supposed to be on. He also has the area on the right fifth toe which was unclear etiology but I think became secondarily infected I gave him 2 weeks of antibiotics for this and this seems to have settled down he still has a black eschar over the tip of the toe. X-ray was negative for fracture. He says he has a history of gout. 2/8; the patient's wound on the right posterior calf is about the same. The superficial area medially also about the same. He got a prescription for prednisoneo Gout after he developed erythema on the  dorsal aspect of his left great toe going along with the right fifth toe which has been problematic all along. He has not taken it because he is concerned about increasing CBGs. He has a transplanted kidney is already on prednisone 5 mg. He would not be a good candidate for NSAIDs. Perhaps colchicine. He is not aware of what his uric acid level is 2/15; his right posterior calf wound seems to be coming in in terms of width. Everything here looks fairly good. No mechanical debridement we have been using Hydrofera Blue. He has had an ablation by vein and vascular. Felt to have adequate arterial supply  to this area. The patient got prednisone last week from Dr. Angelique Holm of nephrology. At my urging he actually took it. The area on his left toe was a lot better. The right toe was not as painful but still erythematous with a wound at the tip. We have been using silver alginate here 2/22; right posterior calf seems to be gradually epithelializing. Still a fairly substantial wound. He still has an area on the tip of his right fifth toe which I think was gout related. This is gradually improving. We have been using Unna boots to wrap X-ray I did of the foot last time was again negative there was soft tissue irregularity about the distal fifth digit but no radiographic evidence of bone damage 3/1; right posterior calf seems to be gradually improving however there were areas of hyper granulation. We have been using Hydrofera Blue. The hyper granulation is mostly evident in the most superior finger shape projection of the wound itself. The area on the right fifth toe also was slough covered and required debridement. 3/8 continued improvement in the right posterior calf and the tip of the right fifth toe. We have been using Hydrofera Blue under compression he is changing the area to the toe Electronic Signature(s) Signed: 11/26/2019 5:29:15 PM By: [name] MD Entered By: [name] on  11/26/2019 11:35:20 -------------------------------------------------------------------------------- Physical Exam Details Patient Name: Date of Service: LEM, PEARY 11/26/2019 9:45 AM Medical Record [id number] Patient Account Number: [id number] Date of Birth/Sex: Treating RN: 06-13-1946 (74 y.o. [name] Primary Care Provider: [name] Other Clinician: Referring Provider: Treating Provider/Extender:Natilee Gauer, [name], [name] in Treatment: 16 Constitutional Sitting or standing Blood Pressure is within target range for patient.. Pulse regular and within target range for patient.Marland Kitchen Respirations regular, non-labored and within target range.. Temperature is normal and within the target range for the patient.Marland Kitchen Appears in no distress. Cardiovascular Pedal pulses are palpable. We have excellent edema control. Notes Wound exam; right fifth toe is less swollen and less erythematous. Tip of this has some debris although I did not debride this today. The large area on the posterior right calf continues to look better especially in terms of the width and the depth of the inferior part of this. Rims of epithelialization look healthy. Debrided with Anasept and gauze Electronic Signature(s) Signed: 11/26/2019 5:29:15 PM By: [name] MD Entered By: [name] on 11/26/2019 11:39:23 -------------------------------------------------------------------------------- Physician Orders Details Patient Name: Date of Service: ANTWAIN, CALIENDO 11/26/2019 9:45 AM Medical Record [id number] Patient Account Number: [id number] Date of Birth/Sex: Treating RN: 11-01-1945 (74 y.o. [name] Primary Care Provider: [name] Other Clinician: Referring Provider: Treating Provider/Extender:Riddik Senna, [name], [name] in Treatment: 64 Verbal / Phone Orders: No Diagnosis Coding ICD-10 Coding Code Description S80.11XD  Contusion of right lower leg, subsequent encounter L97.212 Non-pressure chronic ulcer of right calf with fat layer exposed I87.321 Chronic venous hypertension (idiopathic) with inflammation of right lower extremity E11.622 Type 2 diabetes mellitus with other skin ulcer L97.518 Non-pressure chronic ulcer of other part of right foot with other specified severity Follow-up Appointments Return Appointment in 2 weeks. Dressing Change Frequency Wound #3 Right,Posterior Lower Leg Change dressing three times week. - home health to change 2x/week on week that patient has appt at wound clinic Wound #5 Right Toe Fifth Change dressing three times week. - home health to change 2x/week on week that patient has appt at wound clinic Skin Barriers/Peri-Wound Care Barrier cream Moisturizing lotion Wound Cleansing Clean  wound with Normal Saline. - or normal saline on days that dressing is changed May shower with protection. Primary Wound Dressing Wound #3 Right,Posterior Lower Leg Hydrofera Blue - Classic Wound #5 Right Toe Fifth Hydrofera Blue - Classic Secondary Dressing Wound #3 Right,Posterior Lower Leg Foam - foam top bend of foot for protection ABD pad Zetuvit or Kerramax - or Xtrasorb Wound #5 Right Toe Fifth Kerlix/Rolled Gauze Dry Gauze Edema Control Unna Boot to Right Lower Extremity Avoid standing for long periods of time Elevate legs to the level of the heart or above for 30 minutes daily and/or when sitting, a frequency of: - throughout the day Exercise regularly Additional Orders / Instructions Other: - Go to ER if toe/leg gets more red/black, increased pain, fever Other: - Make follow up appt with Kidney doctor regarding uncontrolled swelling [facility] for wound care. - [facility] Electronic Signature(s) Signed: 11/26/2019 5:29:15 PM By: [name] MD Signed: 11/26/2019 5:59:01 PM By: [name] RN, BSN Entered By: [name]  on 11/26/2019 11:05:55 -------------------------------------------------------------------------------- Problem List Details Patient Name: Date of Service: TIYON, SANOR 11/26/2019 9:45 AM Medical Record [id number] Patient Account Number: [id number] Date of Birth/Sex: Treating RN: 26-Oct-1945 (73 y.o. Jonette Eva, Briant Cedar Primary Care Provider: [name] Other Clinician: Referring Provider: Treating Provider/Extender:Hazen Brumett, [name], [name] in Treatment: 16 Active Problems ICD-10 Evaluated Encounter Code Description Active Date Today Diagnosis S80.11XD Contusion of right lower leg, subsequent encounter 08/06/2019 No Yes L97.212 Non-pressure chronic ulcer of right calf with fat layer 08/06/2019 No Yes exposed I87.321 Chronic venous hypertension (idiopathic) with 08/06/2019 No Yes inflammation of right lower extremity E11.622 Type 2 diabetes mellitus with other skin ulcer 08/06/2019 No Yes L97.518 Non-pressure chronic ulcer of other part of right foot 10/01/2019 No Yes with other specified severity Inactive Problems Resolved Problems Electronic Signature(s) Signed: 11/26/2019 5:29:15 PM By: [name] MD Entered By: [name] on 11/26/2019 11:34:38 -------------------------------------------------------------------------------- Progress Note Details Patient Name: Date of Service: DARRY, KELNHOFER 11/26/2019 9:45 AM Medical Record [id number] Patient Account Number: [id number] Date of Birth/Sex: Treating RN: 02-12-1946 (74 y.o. [name] Primary Care Provider: [name] Other Clinician: Referring Provider: Treating Provider/Extender:Ciarah Peace, [name], [name] in Treatment: 16 Subjective History of Present Illness (HPI) [place] HPI Description: 74 year old gentleman who was recently seen by his nephrologist [name], and noted to have a wound on his left lower extremity which was  lacerated 2 months ago and now has reopened. The patient's left shin has a ulceration with some exudate but no evidence of infection and he was referred to [place] for further care as it was known that the patient has had some peripheral vascular disease in the past. Past medical history significant for chronic kidney disease, atrial fibrillation, diabetes mellitus,status post kidney transplant in 1983 and 2005, a week fistula graft placement, status post previous bowel surgery. 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 10/06/2015 -- x-ray of the left tibia and fibula shows no evidence of osteomyelitis. The patient has also had Doppler studies of his extremity and is awaiting the appointment with the vascular surgeon. We have not yet received these reports. 10/13/2015 -- lower extremity venous duplex reflux evaluation shows reflux in the left common femoral vein, left saphenofemoral junction and the proximal greater saphenous vein extending to the proximal calf. There is also reflux in the left proximal to mid small saphenous vein. Arterial duplex studies  done showed the resting ABI was not applicable due to tibial artery medial calcification. 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 The right TBI was 1 which is normal and the left ABI was 1 which is normal. Patient has otherwise been doing fine and has been compliant with his dressings. 10/20/2015 -- He was seen by [name] recently for a vascular opinion on 10/15/2015. His left lower extremity venous insufficiency duplex study revealed GSV reflux,SS vein reflux and deep venous reflux in the common femoral vein. His ABIs were non compressible and his TBI on the right was 1.01 and on the left was 0.80. He was asked to continue with the wound care with compressive therapy followed by EVLA  of the left GS vein 3 months. He recommended 20-30 mm thigh-high compression stockings and the need for a three-month trial of this. The patient had an Unna boot applied at the vascular office but he could not tolerate this with a lot of pain and issues with his toes and hence came here on Friday for removal of this and we reapplied a 2 layer compression. 11/10/2015 -- patient still has not purchased his 20-30 mm thigh-high compression stockings as prescribed by [name]. Readmission: 08/08/18 on evaluation today patient presents for readmission concerning a new injury to the left anterior lower extremity. He was previously seen in 2017 here in our clinic. He states that he has done fairly well since that point. Nonetheless he is having at this time some pain but states that he hit this on a table that fell over and actually struck his leg. This appears to have pulled back some of his skin which folded in on itself and is causing some difficulty as far as that is concerned. There does not appear to be any evidence of infection at this time. No fevers, chills, nausea, or vomiting noted at this time. He's been using dressings on his own currently without complication. 08/15/18 on evaluation today patient actually appears to be doing somewhat better in regard to his wound of the lower [name] when compared to the first visit last week. I had to do a much more extensive debridement at that time it does appear that I'm gonna have to perform some debridement today but it does not look to be as extensive by any means. Nonetheless fortunately he does not show any signs of infection he does have discomfort at this site. I believe based on what I'm seeing currently he may benefit from Iodoflex to help keep the wound bed clean. Patient tolerated therapy without complication. Upon evaluation today the patient actually appears to be doing excellent in regard to his left lower extremity ulcer. This is much  better than the previous two visits where he had a lot of necrotic tissue around the edge of the wound simply due to the fact that again there was a significant skin tear where the edge had been cleared away prior to reattaching and being able to heal appropriately. He seems to be doing much better at this point. 08/28/18 on evaluation today the patient's wound actually does appear to be showing signs of improvement. With that being said though he is improving he would likely note even greater improvement if we were able to sharply debride the wound. Nonetheless this caused him to much discomfort he tells me. 09/04/18 on evaluation today patient actually appears to  be showing signs of improvement in regard to his left lower extremity ulcer. He has been tolerating the dressing changes including the wrap although he tells me at this point that the burning does last for a couple of days even with just the Iodoflex. I was afraid that this may been part of the issue that he was having with discomfort. It does seem to be the case. Nonetheless he shows no signs of evidence of infection at this time which is good news. No fevers chills noted ADMISSION to [name] 10/05/2018 This is a patient who was cared for in 2017 and again in the fall of this year at our sister clinic and [facility]. He actually lives in [place] in [place]. We have been dealing with an apparent traumatic area on the left anterior tibial area. This is been present for the last several months. He was supposed to be using Iodoflex Kerlix and Coban however he was hospitalized from 09/05/2018 through 09/11/2018 with delirium secondary to pneumonia. Since then he is only been putting Vaseline gauze on this without compression. He also has a more recent skin tear on the dorsal right hand that may have only happened in the last week. The patient had arterial studies done in 2017 in January which was 3 years ago. At that  point he had noncompressible ABIs but really quite good TBI's both normal. Triphasic waveforms on the right monophasic at the left posterior tibial but triphasic at the left dorsalis pedis. His ABIs in our clinic today were both noncompressible 1/23; the patient has wounds on the right dorsal hand just distal to the wrist and on the left anterior lower extremity. Both of these look very healthy he is using Hydrofera Blue 1/30; left anterior lower extremity wound much smaller. Healthy looking surface. The laceration area just distal to the wrist on the dorsal hand on the right is also just about closed I used Hydrofera Blue here 2/6; left anterior lower extremity wound is much smaller but still open. The laceration area just distal to the wrist on the dorsal hand is fully epithelialized. 2/13; the patient's anterior lower extremity wound is closed. The laceration just distal to the wrist on the dorsal hand is also fully epithelialized and closed. The patient has external compression stockings which I think are 20/30 READMISSION 08/06/2019 Mr. Stencil is a 74 year old man with had several times previously in our clinic. He is a diabetic with a history of chronic renal insufficiency status post kidney transplant in 1983 and again in 2005. He was then in 2017 with a laceration on the left lower extremity. He was worked up at the time with arterial studies and reflux studies. The arterial studies showed ABIs to be noncompressible but TBI's were within normal limits. I do not have the reflux studies at the moment. He was also sent here in 2019 with a left lower extremity wound and then again in 2020 with left lower extremity trauma a skin tear on the wrist. He was discharged with 20/30 stockings identified from myself that that might not be enough compression. Nevertheless he states he was wearing these fairly reliably. In September he had a fall with a substantial bruise in the area of the wound. He  says he saw orthopedics and they told him there was some muscle strain sometime it later this opened into a wound. He has a fairly substantial wound on the right posterior calf. Satellite areas around this including medially and posteriorly. He  has not worn his stockings since the injury Past medical history; includes chronic renal failure secondary to diabetes with kidney transplant x2, atrial fibrillation, heart failure with preserved ejection fraction, coronary artery disease. ABIs on the right in our clinic were once again noncompressible 08/13/2019 on evaluation today patient appears to be doing decently well with regard to his wound compared to last week's evaluation. Unfortunately he is still having a lot of discomfort at this point which is I think in some part due to the 3 layer compression wrap which is a little bit stronger I think for him. When he was here before we actually utilized a Kerlix and Coban wrap which he states seemed to got a little bit better. Nonetheless I think we can probably drop back to this in light of the discomfort that he had. Nonetheless the pain was not really right around the wound itself as much as it was around the ankle in particular. The Iodoflex does seem to have done well for him As the wound is appearing somewhat better today which is excellent news. 11/30; still complaining of a lot of pain. Apparently arterial studies I ordered 2 weeks ago are below. I do not believe we have an appointment with vein and vascular as of yet; ABI Findings: +---------+------------------+-----+----------+--------+ Right Rt Pressure (mmHg)IndexWaveform Comment  +---------+------------------+-----+----------+--------+ PTA >254 1.50 monophasic  +---------+------------------+-----+----------+--------+ DP >254 1.50 monophasic  +---------+------------------+-----+----------+--------+ Great Toe68 0.40 Abnormal    +---------+------------------+-----+----------+--------+ +---------+------------------+-----+----------+-------+ Left Lt Pressure (mmHg)IndexWaveform Comment +---------+------------------+-----+----------+-------+ Brachial 169     +---------+------------------+-----+----------+-------+ PTA >254 1.50 monophasic  +---------+------------------+-----+----------+-------+ DP >254 1.50 monophasic  +---------+------------------+-----+----------+-------+ Great Toe40 0.24 Abnormal   +---------+------------------+-----+----------+-------+ Pedal arteries appear hyperemic. Patient refused Brachial pressure in the right arm. Summary: Right: Resting right ankle-brachial index indicates noncompressible right lower extremity arteries. The right toe- brachial index is abnormal. Left: Resting left ankle-brachial index indicates noncompressible left lower extremity arteries. The left toe-brachial index is abnormal. He is also having considerably more swelling in his left calf. This was not there when I last saw him 2 weeks ago. He tells me that some of the home health compression wraps have been slipping down and that may be the issue here however a month I am uncertain 09/3; sees vascular on December 22. Still complaining of a lot of pain. DVT study I did last time was negative for DVT 12/14; still complaining of pain which if this is arterial is certainly claudication and rest keeps him uncomfortable at night. He has an appointment with vein and vascular on December 22. Wound surface is better using Iodoflex. Once the surface of this is satisfactory and we have exhausted the vascular route. Perhaps an advanced treatment option. He has a configuration of the venous ulceration although his arterial studies are not very good. The other issue is the patient has a transplanted kidney. This will make angiography difficult and challenging issue 12/21; still complaining of pain and  drainage. We are using Iodoflex on the wound under compression. He sees Dr. Donnetta Hutching tomorrow to evaluate his noninvasive studies noted above. He has a transplanted kidney further complicating the options for angiography. 12/28; still using Iodoflex under compression. I have Dr. Luther Parody note from 12/22. he noted arterial studies revealing monophasic waveforms at the pedal vessels bilaterally and calcified vessels making the ABI unreliable. He did not comment on the reduced TBI's. He felt these were venous wounds based on the palpable dorsalis pedis pulse. 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 And they arranged for formal venous  duplex with reflux studies in the next several weeks. Follow-up with either Scot Dock or Dr. Oneida Alar 09/24/2019 still using Iodoflex under compression. He has an appointment with Dr. Doren Custard on 1/7 with regards to his venous disease. The patient was not felt to have a primary arterial problem for the nonhealing of his wound. We did attempt to wrap him with 3 layer when he first came into the clinic he complained of a lot of pain in the ankle although this may have been because the dressing fell down somewhat. He has far too edema fluid in the right leg for a good prognosis about healing this wound He comes in today with an excoriation on the bottom part of his right fifth toe. He thinks he may have done this taking off his clothes and that something got caught on the toe. There is no open wound however the toe itself is very painful 1/11; we are using Iodoflex under compression. The wound bed is clean. He went on to see Dr. Doren Custard on 09/27/2019 he again feels that the patient's arterial supply is adequate. He feels that he might benefit from right greater saphenous vein ablation for a venous ulcer. In the meantime the area that he was complaining about last week on the right fifth toe. An x-ray that I ordered showed marked soft tissue swelling along the distal aspect of the  right foot but there was no evidence of osteomyelitis. He comes in today with the fifth toenail just about coming off. He has black eschar underneath the toe on the plantar aspect. The toe was swollen red and very painful. In the right setting this could be a significant soft tissue infection versus ischemia of the toe itself. He did not show this to Dr. Doren Custard 1/18; I am using Iodoflex under compression a large wound on the right posterior calf. He had his right greater saphenous vein ablation by Dr. Doren Custard although I am not sure that is the only problem here. ooThe right fifth toe which was possibly trauma 2 weeks ago continues to be exceptionally painful with a necrotic tip. Maybe not quite as swollen. I started him empirically on Augmentin 5/125 1 p.o. twice daily last week after discussing this with Dr. Loletha Grayer of nephrology. Perhaps somewhat better this week but not as good as I was hoping. A plain x-ray was negative. He comes in today with an area on the medial right calf that was blistered and now open. In looking at things he appears to be systemically fluid volume overloaded. He has a transplanted kidney. He states he takes his Lasix variably when he has appointments he tries not to take it the later I am really not certain if he takes this reliably. However he has far too much edema fluid in the right leg to easily heal this wound and he appears to be developing blisters medially to form additional wounds 1/25; his CO2 angiogram was done by Dr. Doren Custard last week. This showed the proximal arteries all to be patent. On the right side the common femoral deep femoral superficial femoral popliteal anterior tibial and peroneal arteries were patent. The posterior tibial was occluded but reconstituted distally via collaterals from the peroneal artery he was felt he should have enough blood flow to heal his wounds including the right fifth toe. The right fifth toe looks better however he is still  complaining of a lot of pain. The large area which is a venous ulceration posteriorly has a better looking surface I  think we can switch to [facility] today 2/1; the patient's original wounds on the right posterior medial calf has come down in width however superior to this he has new denuded areas and I am concerned we simply do not have enough edema control. He has already undergone a right greater saphenous ablation. He had a CO2 angiogram done by Dr. Doren Custard and the comment was that we should have enough blood flow to heal the venous wound however we simply do not seem to have enough edema control with 3 layer compression. The patient is status post kidney transplant although his exact renal function is not really clear. Nor am i sure what dose of diuretic he is supposed to be on. He also has the area on the right fifth toe which was unclear etiology but I think became secondarily infected I gave him 2 weeks of antibiotics for this and this seems to have settled down he still has a black eschar over the tip of the toe. X-ray was negative for fracture. He says he has a history of gout. 2/8; the patient's wound on the right posterior calf is about the same. The superficial area medially also about the same. He got a prescription for prednisoneo Gout after he developed erythema on the dorsal aspect of his left great toe going along with the right fifth toe which has been problematic all along. He has not taken it because he is concerned about increasing CBGs. He has a transplanted kidney is already on prednisone 5 mg. He would not be a good candidate for NSAIDs. Perhaps colchicine. He is not aware of what his uric acid level is 2/15; his right posterior calf wound seems to be coming in in terms of width. Everything here looks fairly good. No mechanical debridement we have been using Hydrofera Blue. He has had an ablation by vein and vascular. Felt to have adequate arterial supply to this  area. The patient got prednisone last week from Dr. Angelique Holm of nephrology. At my urging he actually took it. The area on his left toe was a lot better. The right toe was not as painful but still erythematous with a wound at the tip. We have been using silver alginate here 2/22; right posterior calf seems to be gradually epithelializing. Still a fairly substantial wound. He still has an area on the tip of his right fifth toe which I think was gout related. This is gradually improving. We have been using Unna boots to wrap X-ray I did of the foot last time was again negative there was soft tissue irregularity about the distal fifth digit but no radiographic evidence of bone damage 3/1; right posterior calf seems to be gradually improving however there were areas of hyper granulation. We have been using Hydrofera Blue. The hyper granulation is mostly evident in the most superior finger shape projection of the wound itself. ooThe area on the right fifth toe also was slough covered and required debridement. 3/8 continued improvement in the right posterior calf and the tip of the right fifth toe. We have been using Hydrofera Blue under compression he is changing the area to the toe Objective Constitutional Sitting or standing Blood Pressure is within target range for patient.. Pulse regular and within target range for patient.Marland Kitchen Respirations regular, non-labored and within target range.. Temperature is normal and within the target range for the patient.Marland Kitchen Appears in no distress. Vitals Time Taken: 10:05 AM, Height: 67 in, Weight: 190 lbs, BMI: 29.8, Temperature: 98.1 F,  Pulse: 52 bpm, Respiratory Rate: 20 breaths/min, Blood Pressure: 131/63 mmHg, Capillary Blood Glucose: 120 mg/dl. Cardiovascular Pedal pulses are palpable. We have excellent edema control. General Notes: Wound exam; right fifth toe is less swollen and less erythematous. Tip of this has some debris although I did not debride  this today. ooThe large area on the posterior right calf continues to look better especially in terms of the width and the depth of the inferior part of this. Rims of epithelialization look healthy. Debrided with Anasept and gauze Integumentary (Hair, Skin) Wound #3 status is Open. Original cause of wound was Trauma. The wound is located on the Right,Posterior Lower Leg. The wound measures 15cm length x 4.5cm width x 0.2cm depth; 53.014cm^2 area and 10.603cm^3 volume. There is Fat Layer (Subcutaneous Tissue) Exposed exposed. There is no tunneling or undermining noted. There is a large amount of serosanguineous drainage noted. The wound margin is thickened. There is large (67-100%) pink, hyper - granulation within the wound bed. There is a small (1-33%) amount of necrotic tissue within the wound bed including Adherent Slough. Wound #5 status is Open. Original cause of wound was Blister. The wound is located on the Right Toe Fifth. The wound measures 0.4cm length x 0.5cm width x 0.1cm depth; 0.157cm^2 area and 0.016cm^3 volume. There is no tunneling or undermining noted. There is a small amount of serous drainage noted. The wound margin is distinct with the outline attached to the wound base. There is small (1-33%) pink granulation within the wound bed. There is a large (67-100%) amount of necrotic tissue within the wound bed including Adherent Slough. 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 There was a Haematologist Compression Therapy Procedure by [name], RN. Post procedure Diagnosis Wound #3: Same as Pre-Procedure Plan Follow-up Appointments: Return Appointment in 2 weeks. Dressing Change Frequency: Wound #3 Right,Posterior Lower Leg: Change dressing three times week. - home health to change 2x/week on week that patient has appt at wound clinic Wound #5 Right Toe Fifth: Change dressing three times week. - home health to change 2x/week on week that patient has appt at wound clinic Skin Barriers/Peri-Wound Care: Barrier cream Moisturizing lotion Wound Cleansing: Clean wound with Normal Saline. - or normal saline on days that dressing is changed May shower with protection. Primary Wound Dressing: Wound #3 Right,Posterior Lower Leg: Hydrofera Blue - Classic Wound #5 Right Toe Fifth: Hydrofera Blue - Classic Secondary Dressing: Wound #3 Right,Posterior Lower Leg: Foam - foam top bend of foot for protection ABD pad Zetuvit or Kerramax - or Xtrasorb Wound #5 Right Toe Fifth: Kerlix/Rolled Gauze Dry Gauze Edema Control: Unna Boot to Right Lower Extremity Avoid standing for long periods of time Elevate legs to the level of the heart or above for 30 minutes daily and/or when sitting, a frequency of: - throughout the day Exercise regularly Additional Orders / Instructions: Other: - Go to ER if toe/leg gets more red/black, increased pain, fever Other: - Make follow up appt with Kidney doctor regarding uncontrolled swelling [facility] - [facility] 1. Overall considerable improvement 2.  Continue with Hydrofera Blue/ABD/Unna boot Electronic Signature(s) Signed: 11/26/2019 5:29:15 PM By: [name] MD Entered By: [name] on 11/26/2019 11:46:28 -------------------------------------------------------------------------------- SuperBill Details Patient Name: Date of Service: SHLOIMY, MICHALSKI 11/26/2019 Medical Record [id number] Patient Account Number:  [id number] Date of Birth/Sex: Treating RN: [birth year] (73 y.o. 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 (idiopathic) with inflammation of right lower extremity E11.622 Type 2 diabetes mellitus with other skin ulcer L97.518 Non-pressure chronic ulcer of other part of right foot with other specified severity Facility Procedures Physician Procedures CPT4 Code Description: [id number] - WC PHYS LEVEL 3 - EST PT ICD-10 Diagnosis Description S80.11XD Contusion of right lower leg, subsequent encounter L97.212 Non-pressure chronic ulcer of right calf with fat layer I87.321 Chronic venous hypertension  (idiopathic) with inflammati extremity L97.518 Non-pressure chronic ulcer of other part of right foot w Modifier: exposed on of right lower ith other specifi Quantity: 1 ed severity Electronic Signature(s) Signed: 11/26/2019 5:29:15 PM By: [name] MD Signed: 11/26/2019 5:59:01 PM By: [name] RN, BSN Entered By: [name] on 11/26/2019 14:35:46

## 2019-12-10 ENCOUNTER — Encounter (HOSPITAL_BASED_OUTPATIENT_CLINIC_OR_DEPARTMENT_OTHER): Payer: Medicare Other | Admitting: Internal Medicine

## 2019-12-10 ENCOUNTER — Other Ambulatory Visit: Payer: Self-pay

## 2019-12-10 DIAGNOSIS — S90121A Contusion of right lesser toe(s) without damage to nail, initial encounter: Secondary | ICD-10-CM | POA: Diagnosis not present
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
## 2019-12-10 NOTE — Progress Notes (Signed)
Michael, [name] ([id number]) Visit Report for 12/10/2019 HPI Details Patient Name: Date of Service: [name], [name] 12/10/2019 9:45 AM Medical Record [id number] Patient Account Number: [id number] Date of Birth/Sex: Treating RN: 1945/10/19 (74 y.o. [name] Primary Care Provider: [name] Other Clinician: Referring Provider: Treating Provider/Extender:Pernella Ackerley, [name], [name] in Treatment: 18 History of Present Illness HPI Description: [name] HPI Description: 74 year old gentleman who was recently seen by his nephrologist [name], and noted to have a wound on his left lower extremity which was lacerated 2 months ago and now has reopened. The patient's left shin has a ulceration with some exudate but no evidence of infection and he was referred to [place] for further care as it was known that the patient has had some peripheral vascular disease in the past. Past medical history significant for chronic kidney disease, atrial fibrillation, diabetes mellitus,status post kidney transplant in 1983 and 2005, a week fistula graft placement, status post previous bowel surgery. 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 10/06/2015 -- x-ray of the left tibia and fibula shows no evidence of osteomyelitis. The patient has also had Doppler studies of his extremity and is awaiting the appointment with the vascular surgeon. We have not yet received these reports. 10/13/2015 -- lower extremity venous duplex reflux evaluation shows reflux in the left common femoral vein, left saphenofemoral junction and the proximal greater saphenous vein extending to the proximal calf. There is also reflux in the left proximal to mid small saphenous vein. Arterial duplex studies done showed the resting ABI was not applicable due to tibial artery medial calcification. 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 The right TBI was 1 which is normal and the left ABI was 1 which is normal. Patient has otherwise been doing fine and has been compliant with his dressings. 10/20/2015 -- He was seen by [name] recently for a vascular opinion on 10/15/2015. His left lower extremity venous insufficiency duplex study revealed GSV reflux,SS vein reflux and deep venous reflux in the common femoral vein. His ABIs were non compressible and his TBI on the right was 1.01 and on the left was 0.80. He was asked to continue with the wound care with compressive therapy followed by EVLA of the left GS vein 3 months. He recommended 20-30 mm thigh-high compression stockings and the need for a three-month trial of this. The patient had an Unna boot applied at the vascular office but he could not tolerate this with a lot of pain and issues with his toes and hence came here on Friday for removal of this and we reapplied a 2 layer compression. 11/10/2015 -- patient still has not purchased his 20-30 mm thigh-high compression stockings as prescribed by [name]. Readmission: 08/08/18 on evaluation today patient presents for readmission concerning a new injury to the left anterior lower extremity. He was previously seen in 2017 here in our clinic. He states that he has done fairly well since that point. Nonetheless he is having at this time some pain but states that he hit this on a table that fell over and actually struck his leg. This appears to have pulled back some of his skin which folded in on itself and is causing some difficulty as far as that is concerned. There does not appear to be any evidence of  infection at this time. No fevers, chills, nausea, or vomiting noted at this time. He's been using dressings on his own currently without complication. 08/15/18 on evaluation today patient actually appears to be  doing somewhat better in regard to his wound of the lower [name] when compared to the first visit last week. I had to do a much more extensive debridement at that time it does appear that I'm gonna have to perform some debridement today but it does not look to be as extensive by any means. Nonetheless fortunately he does not show any signs of infection he does have discomfort at this site. I believe based on what I'm seeing currently he may benefit from Iodoflex to help keep the wound bed clean. Patient tolerated therapy without complication. Upon evaluation today the patient actually appears to be doing excellent in regard to his left lower extremity ulcer. This is much better than the previous two visits where he had a lot of necrotic tissue around the edge of the wound simply due to the fact that again there was a significant skin tear where the edge had been cleared away prior to reattaching and being able to heal appropriately. He seems to be doing much better at this point. 08/28/18 on evaluation today the patient's wound actually does appear to be showing signs of improvement. With that being said though he is improving he would likely note even greater improvement if we were able to sharply debride the wound. Nonetheless this caused him to much discomfort he tells me. 09/04/18 on evaluation today patient actually appears to be showing signs of improvement in regard to his left lower extremity ulcer. He has been tolerating the dressing changes including the wrap although he tells me at this point that the burning does last for a couple of days even with just the Iodoflex. I was afraid that this may been part of the issue that he was having with discomfort. It does seem to be the case. Nonetheless he shows no signs of evidence of infection at this time which is good news. No fevers chills noted ADMISSION to [name] 10/05/2018 This is a patient who was cared for in 2017  and again in the fall of this year at our sister clinic and [facility]. He actually lives in [place] in [place]. We have been dealing with an apparent traumatic area on the left anterior tibial area. This is been present for the last several months. He was supposed to be using Iodoflex Kerlix and Coban however he was hospitalized from 09/05/2018 through 09/11/2018 with delirium secondary to pneumonia. Since then he is only been putting Vaseline gauze on this without compression. He also has a more recent skin tear on the dorsal right hand that may have only happened in the last week. The patient had arterial studies done in 2017 in January which was 3 years ago. At that point he had noncompressible ABIs but really quite good TBI's both normal. Triphasic waveforms on the right monophasic at the left posterior tibial but triphasic at the left dorsalis pedis. His ABIs in our clinic today were both noncompressible 1/23; the patient has wounds on the right dorsal hand just distal to the wrist and on the left anterior lower extremity. Both of these look very healthy he is using Hydrofera Blue 1/30; left anterior lower extremity wound much smaller. Healthy looking surface. The laceration area just distal to the wrist on the dorsal hand on the right is also just  about closed I used Hydrofera Blue here 2/6; left anterior lower extremity wound is much smaller but still open. The laceration area just distal to the wrist on the dorsal hand is fully epithelialized. 2/13; the patient's anterior lower extremity wound is closed. The laceration just distal to the wrist on the dorsal hand is also fully epithelialized and closed. The patient has external compression stockings which I think are 20/30 READMISSION 08/06/2019 [name] is a 74 year old man with had several times previously in our clinic. He is a diabetic with a history of chronic renal insufficiency status post kidney transplant in 1983 and again  in 2005. He was then in 2017 with a laceration on the left lower extremity. He was worked up at the time with arterial studies and reflux studies. The arterial studies showed ABIs to be noncompressible but TBI's were within normal limits. I do not have the reflux studies at the moment. He was also sent here in 2019 with a left lower extremity wound and then again in 2020 with left lower extremity trauma a skin tear on the wrist. He was discharged with 20/30 stockings identified from myself that that might not be enough compression. Nevertheless he states he was wearing these fairly reliably. In September he had a fall with a substantial bruise in the area of the wound. He says he saw orthopedics and they told him there was some muscle strain sometime it later this opened into a wound. He has a fairly substantial wound on the right posterior calf. Satellite areas around this including medially and posteriorly. He has not worn his stockings since the injury Past medical history; includes chronic renal failure secondary to diabetes with kidney transplant x2, atrial fibrillation, heart failure with preserved ejection fraction, coronary artery disease. ABIs on the right in our clinic were once again noncompressible 08/13/2019 on evaluation today patient appears to be doing decently well with regard to his wound compared to last week's evaluation. Unfortunately he is still having a lot of discomfort at this point which is I think in some part due to the 3 layer compression wrap which is a little bit stronger I think for him. When he was here before we actually utilized a Kerlix and Coban wrap which he states seemed to got a little bit better. Nonetheless I think we can probably drop back to this in light of the discomfort that he had. Nonetheless the pain was not really right around the wound itself as much as it was around the ankle in particular. The Iodoflex does seem to have done well for him As the  wound is appearing somewhat better today which is excellent news. 11/30; still complaining of a lot of pain. Apparently arterial studies I ordered 2 weeks ago are below. I do not believe we have an appointment with vein and vascular as of yet; ABI Findings: +---------+------------------+-----+----------+--------+ Right Rt Pressure (mmHg)IndexWaveform Comment  +---------+------------------+-----+----------+--------+ PTA >254 1.50 monophasic  +---------+------------------+-----+----------+--------+ DP >254 1.50 monophasic  +---------+------------------+-----+----------+--------+ Great Toe68 0.40 Abnormal   +---------+------------------+-----+----------+--------+ +---------+------------------+-----+----------+-------+ Left Lt Pressure (mmHg)IndexWaveform Comment +---------+------------------+-----+----------+-------+ Brachial 169     +---------+------------------+-----+----------+-------+ PTA >254 1.50 monophasic  +---------+------------------+-----+----------+-------+ DP >254 1.50 monophasic  +---------+------------------+-----+----------+-------+ Great Toe40 0.24 Abnormal   +---------+------------------+-----+----------+-------+ Pedal arteries appear hyperemic. Patient refused Brachial pressure in the right arm. Summary: Right: Resting right ankle-brachial index indicates noncompressible right lower extremity arteries. The right toe- brachial index is abnormal. Left: Resting left ankle-brachial index indicates noncompressible left lower extremity arteries. The left toe-brachial index is abnormal.  He is also having considerably more swelling in his left calf. This was not there when I last saw him 2 weeks ago. He tells me that some of the home health compression wraps have been slipping down and that may be the issue here however a month I am uncertain 93/2; sees vascular on December 22. Still complaining of a lot of pain. DVT study  I did last time was negative for DVT 12/14; still complaining of pain which if this is arterial is certainly claudication and rest keeps him uncomfortable at night. He has an appointment with vein and vascular on December 22. Wound surface is better using Iodoflex. Once the surface of this is satisfactory and we have exhausted the vascular route. Perhaps an advanced treatment option. He has a configuration of the venous ulceration although his arterial studies are not very good. The other issue is the patient has a transplanted kidney. This will make angiography difficult and challenging issue 12/21; still complaining of pain and drainage. We are using Iodoflex on the wound under compression. He sees Dr. Donnetta Hutching tomorrow to evaluate his noninvasive studies noted above. He has a transplanted kidney further complicating the options for angiography. 12/28; still using Iodoflex under compression. I have Dr. Luther Parody note from 12/22. he noted arterial studies revealing monophasic waveforms at the pedal vessels bilaterally and calcified vessels making the ABI unreliable. He did not comment on the reduced TBI's. He felt these were venous wounds based on the palpable dorsalis pedis pulse. 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 And they arranged for formal venous duplex with reflux studies in the next several weeks. Follow-up with either Scot Dock or Dr. Oneida Alar 09/24/2019 still using Iodoflex under compression. He has an appointment with Dr. Doren Custard on 1/7 with regards to his venous disease. The patient was not felt to have a primary arterial problem for the nonhealing of his wound. We did attempt to wrap him with 3 layer when he first came into the clinic he complained of a lot of pain in the ankle although this may have been because the dressing fell down somewhat. He has far too edema fluid in the right leg for a good prognosis about healing this wound He comes in today with an excoriation on the  bottom part of his right fifth toe. He thinks he may have done this taking off his clothes and that something got caught on the toe. There is no open wound however the toe itself is very painful 1/11; we are using Iodoflex under compression. The wound bed is clean. He went on to see Dr. Doren Custard on 09/27/2019 he again feels that the patient's arterial supply is adequate. He feels that he might benefit from right greater saphenous vein ablation for a venous ulcer. In the meantime the area that he was complaining about last week on the right fifth toe. An x-ray that I ordered showed marked soft tissue swelling along the distal aspect of the right foot but there was no evidence of osteomyelitis. He comes in today with the fifth toenail just about coming off. He has black eschar underneath the toe on the plantar aspect. The toe was swollen red and very painful. In the right setting this could be a significant soft tissue infection versus ischemia of the toe itself. He did not show this to Dr. Doren Custard 1/18; I am using Iodoflex under compression a large wound on the right posterior calf. He had his right greater saphenous vein ablation by Dr. Doren Custard  although I am not sure that is the only problem here. The right fifth toe which was possibly trauma 2 weeks ago continues to be exceptionally painful with a necrotic tip. Maybe not quite as swollen. I started him empirically on Augmentin 5/125 1 p.o. twice daily last week after discussing this with Dr. Loletha Grayer of nephrology. Perhaps somewhat better this week but not as good as I was hoping. A plain x-ray was negative. He comes in today with an area on the medial right calf that was blistered and now open. In looking at things he appears to be systemically fluid volume overloaded. He has a transplanted kidney. He states he takes his Lasix variably when he has appointments he tries not to take it the later I am really not certain if he takes this reliably. However he has  far too much edema fluid in the right leg to easily heal this wound and he appears to be developing blisters medially to form additional wounds 1/25; his CO2 angiogram was done by Dr. Doren Custard last week. This showed the proximal arteries all to be patent. On the right side the common femoral deep femoral superficial femoral popliteal anterior tibial and peroneal arteries were patent. The posterior tibial was occluded but reconstituted distally via collaterals from the peroneal artery he was felt he should have enough blood flow to heal his wounds including the right fifth toe. The right fifth toe looks better however he is still complaining of a lot of pain. The large area which is a venous ulceration posteriorly has a better looking surface I think we can switch to Hydrofera Blue today 2/1; the patient's original wounds on the right posterior medial calf has come down in width however superior to this he has new denuded areas and I am concerned we simply do not have enough edema control. He has already undergone a right greater saphenous ablation. He had a CO2 angiogram done by Dr. Doren Custard and the comment was that we should have enough blood flow to heal the venous wound however we simply do not seem to have enough edema control with 3 layer compression. The patient is status post kidney transplant although his exact renal function is not really clear. Nor am i sure what dose of diuretic he is supposed to be on. He also has the area on the right fifth toe which was unclear etiology but I think became secondarily infected I gave him 2 weeks of antibiotics for this and this seems to have settled down he still has a black eschar over the tip of the toe. X-ray was negative for fracture. He says he has a history of gout. 2/8; the patient's wound on the right posterior calf is about the same. The superficial area medially also about the same. He got a prescription for prednisoneo Gout after he developed  erythema on the dorsal aspect of his left great toe going along with the right fifth toe which has been problematic all along. He has not taken it because he is concerned about increasing CBGs. He has a transplanted kidney is already on prednisone 5 mg. He would not be a good candidate for NSAIDs. Perhaps colchicine. He is not aware of what his uric acid level is 2/15; his right posterior calf wound seems to be coming in in terms of width. Everything here looks fairly good. No mechanical debridement we have been using Hydrofera Blue. He has had an ablation by vein and vascular. Felt to have adequate arterial supply  to this area. The patient got prednisone last week from Dr. Angelique Holm of nephrology. At my urging he actually took it. The area on his left toe was a lot better. The right toe was not as painful but still erythematous with a wound at the tip. We have been using silver alginate here 2/22; right posterior calf seems to be gradually epithelializing. Still a fairly substantial wound. He still has an area on the tip of his right fifth toe which I think was gout related. This is gradually improving. We have been using Unna boots to wrap X-ray I did of the foot last time was again negative there was soft tissue irregularity about the distal fifth digit but no radiographic evidence of bone damage 3/1; right posterior calf seems to be gradually improving however there were areas of hyper granulation. We have been using Hydrofera Blue. The hyper granulation is mostly evident in the most superior finger shape projection of the wound itself. The area on the right fifth toe also was slough covered and required debridement. 3/8 continued improvement in the right posterior calf and the tip of the right fifth toe. We have been using Hydrofera Blue under compression he is changing the area to the toe 3/22; continued improvement in the right posterior calf. Unfortunately comes in today with a new  skin tear in the mid anterior tibia area this probably had something to do with changing his dressing home health I called and left this a message last week. Electronic Signature(s) Signed: 12/10/2019 5:34:39 PM By: [name] MD Entered By: [name] on 12/10/2019 11:22:30 -------------------------------------------------------------------------------- Physical Exam Details Patient Name: Date of Service: [name], [name] 12/10/2019 9:45 AM Medical Record [id number] Patient Account Number: [id number] Date of Birth/Sex: Treating RN: Jun 01, 1946 (74 y.o. [name] Primary Care Provider: [name] Other Clinician: Referring Provider: Treating Provider/Extender:Bralynn Velador, [name], [name] in Treatment: 18 Constitutional Sitting or standing Blood Pressure is within target range for patient.. Pulse regular and within target range for patient.Marland Kitchen Respirations regular, non-labored and within target range.. Temperature is normal and within the target range for the patient.Marland Kitchen Appears in no distress. Notes Wound exam; right fifth toe still somewhat swollen but it does not have an open area. The large area on the posterior right calf continues to contract especially superiorly. Inferiorly he also has some epithelialization although there is also some hyper granulation. Electronic Signature(s) Signed: 12/10/2019 5:34:39 PM By: [name] MD Entered By: [name] on 12/10/2019 11:23:16 -------------------------------------------------------------------------------- Physician Orders Details Patient Name: Date of Service: Michael, [name] 12/10/2019 9:45 AM Medical Record [id number] Patient Account Number: [id number] Date of Birth/Sex: Treating RN: Sep 08, 1946 (73 y.o. [name] Primary Care Provider: [name] Other Clinician: Referring Provider: Treating Provider/Extender:Josselyne Onofrio, [name], [name] in Treatment: 42 Verbal / Phone Orders: No Diagnosis Coding ICD-10 Coding Code Description S80.11XD Contusion of right lower leg, subsequent encounter L97.212 Non-pressure chronic ulcer of right calf with fat layer exposed I87.321 Chronic venous hypertension (idiopathic) with inflammation of right lower extremity E11.622 Type 2 diabetes mellitus with other skin ulcer L97.518 Non-pressure chronic ulcer of other part of right foot with other specified severity Follow-up Appointments Return Appointment in 1 week. Dressing Change Frequency Wound #3 Right,Posterior Lower Leg Other: - twice a week - wound clinic to change 1x a week on Monday, home health to change 1x a week on Thursday or Friday Wound #5 Right Toe Fifth Other: - twice a week - wound clinic to  change 1x a week on Monday, home health to change 1x a week on Thursday or Friday Wound #9 Right,Anterior Lower Leg Other: - twice a week - wound clinic to change 1x a week on Monday, home health to change 1x a week on Thursday or Friday Skin Barriers/Peri-Wound Care Barrier cream Moisturizing lotion Wound Cleansing Clean wound with Normal Saline. - or normal saline on days that dressing is changed May shower with protection. Primary Wound Dressing Wound #3 Right,Posterior Lower Leg Hydrofera Blue - Classic Wound #5 Right Toe Fifth Hydrofera Blue - Classic Wound #9 Right,Anterior Lower Leg Hydrofera Blue - Classic Secondary Dressing Wound #3 Right,Posterior Lower Leg Foam - foam top bend of foot for protection ABD pad Zetuvit or Kerramax - or Xtrasorb Wound #5 Right Toe Fifth Kerlix/Rolled Gauze Dry Gauze Wound #9 Right,Anterior Lower Leg Dry Gauze Edema Control Unna Boot to Right Lower Extremity - *****ONLY ONE SINGLE LAYER OF UNNA BOOT***** Avoid standing for long periods of time Elevate legs to the level of the heart or above for 30 minutes daily and/or when sitting, a frequency of: - throughout the  day Exercise regularly Additional Orders / Instructions Other: - Go to ER if toe/leg gets more red/black, increased pain, fever Other: - Make follow up appt with Kidney doctor regarding uncontrolled swelling [facility] for wound care. - [facility] Electronic Signature(s) Signed: 12/10/2019 5:18:21 PM By: [name] RN, BSN Signed: 12/10/2019 5:34:39 PM By: [name] MD Entered By: [name] on 12/10/2019 11:05:59 -------------------------------------------------------------------------------- Problem List Details Patient Name: Date of Service: [name], [name] 12/10/2019 9:45 AM Medical Record [id number] Patient Account Number: [id number] Date of Birth/Sex: Treating RN: [birth year] (73 y.o. [name] Primary Care Provider: [name] Other Clinician: Referring Provider: Treating Provider/Extender:Kasyn Stouffer, [name], [name] in Treatment: 18 Active Problems ICD-10 Evaluated Encounter Code Description Active Date Today Diagnosis S80.11XD Contusion of right lower leg, subsequent encounter 08/06/2019 No Yes L97.212 Non-pressure chronic ulcer of right calf with fat layer 08/06/2019 No Yes exposed I87.321 Chronic venous hypertension (idiopathic) with 08/06/2019 No Yes inflammation of right lower extremity E11.622 Type 2 diabetes mellitus with other skin ulcer 08/06/2019 No Yes L97.518 Non-pressure chronic ulcer of other part of right foot 10/01/2019 No Yes with other specified severity L97.811 Non-pressure chronic ulcer of other part of right lower 12/10/2019 No Yes leg limited to breakdown of skin Inactive Problems Resolved Problems Electronic Signature(s) Signed: 12/10/2019 5:34:39 PM By: [name] MD Entered By: [name] on 12/10/2019 11:24:10 -------------------------------------------------------------------------------- Progress Note Details Patient Name: Date of  Service: [name], [name] 12/10/2019 9:45 AM Medical Record [id number] Patient Account Number: [id number] Date of Birth/Sex: Treating RN: 04/24/1946 (74 y.o. [name] Primary Care Provider: [name] Other Clinician: Referring Provider: Treating Provider/Extender:Rohin Krejci, [name], [name] in Treatment: 18 Subjective History of Present Illness (HPI) [name] HPI Description: 74 year old gentleman who was recently seen by his nephrologist [name], and noted to have a wound on his left lower extremity which was lacerated 2 months ago and now has reopened. The patient's left shin has a ulceration with some exudate but no evidence of infection and he was referred to [place] for further care as it was known that the patient has had some peripheral vascular disease in the past. Past medical history significant for chronic kidney disease, atrial fibrillation, diabetes mellitus,status post kidney transplant in 1983 and 2005, a week fistula graft placement, status post previous bowel surgery. he works  as a security guard and is active and on his feet for a long while. 10/06/2015 -- x-ray of the left tibia and fibula shows no evidence of osteomyelitis. The patient has also had Doppler studies of his extremity and is awaiting the appointment with the vascular surgeon. We have not yet received these reports. 10/13/2015 -- lower extremity venous duplex reflux evaluation shows reflux in the left common femoral vein, left saphenofemoral junction and the proximal greater saphenous vein extending to the proximal calf. There is also reflux in the left proximal to mid small saphenous vein. Arterial duplex studies done showed the resting ABI was not applicable due to tibial artery medial calcification. 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 The right TBI was 1 which is normal and the left ABI was 1 which is normal. Patient has otherwise been doing fine and has been compliant with his dressings. 10/20/2015 -- He was seen by [name] recently for a vascular opinion on 10/15/2015. His left lower extremity venous insufficiency duplex study revealed GSV reflux,SS vein reflux and deep venous reflux in the common femoral vein. His ABIs were non compressible and his TBI on the right was 1.01 and on the left was 0.80. He was asked to continue with the wound care with compressive therapy followed by EVLA of the left GS vein 3 months. He recommended 20-30 mm thigh-high compression stockings and the need for a three-month trial of this. The patient had an Unna boot applied at the vascular office but he could not tolerate this with a lot of pain and issues with his toes and hence came here on Friday for removal of this and we reapplied a 2 layer compression. 11/10/2015 -- patient still has not purchased his 20-30 mm thigh-high compression stockings as prescribed by [name]. Readmission: 08/08/18 on evaluation today patient presents for readmission concerning a new injury to the left anterior lower extremity. He was previously seen in 2017 here in our clinic. He states that he has done fairly well since that point. Nonetheless he is having at this time some pain but states that he hit this on a table that fell over and actually struck his leg. This appears to have pulled back some of his skin which folded in on itself and is causing some difficulty as far as that is concerned. There does not appear to be any evidence of infection at this time. No fevers, chills, nausea, or vomiting noted at this time. He's been using dressings on his own currently without complication. 08/15/18 on evaluation today patient actually appears to be doing somewhat better in regard to his wound of the lower [name] when compared to the first  visit last week. I had to do a much more extensive debridement at that time it does appear that I'm gonna have to perform some debridement today but it does not look to be as extensive by any means. Nonetheless fortunately he does not show any signs of infection he does have discomfort at this site. I believe based on what I'm seeing currently he may benefit from Iodoflex to help keep the wound bed clean. Patient tolerated therapy without complication. Upon evaluation today the patient actually appears to be doing excellent in regard to his left lower extremity ulcer. This is much better than the previous two visits where he had a lot of necrotic tissue around the  edge of the wound simply due to the fact that again there was a significant skin tear where the edge had been cleared away prior to reattaching and being able to heal appropriately. He seems to be doing much better at this point. 08/28/18 on evaluation today the patient's wound actually does appear to be showing signs of improvement. With that being said though he is improving he would likely note even greater improvement if we were able to sharply debride the wound. Nonetheless this caused him to much discomfort he tells me. 09/04/18 on evaluation today patient actually appears to be showing signs of improvement in regard to his left lower extremity ulcer. He has been tolerating the dressing changes including the wrap although he tells me at this point that the burning does last for a couple of days even with just the Iodoflex. I was afraid that this may been part of the issue that he was having with discomfort. It does seem to be the case. Nonetheless he shows no signs of evidence of infection at this time which is good news. No fevers chills noted ADMISSION to [name] 10/05/2018 This is a patient who was cared for in 2017 and again in the fall of this year at our sister clinic and [facility]. He actually lives in  [place] in [place]. We have been dealing with an apparent traumatic area on the left anterior tibial area. This is been present for the last several months. He was supposed to be using Iodoflex Kerlix and Coban however he was hospitalized from 09/05/2018 through 09/11/2018 with delirium secondary to pneumonia. Since then he is only been putting Vaseline gauze on this without compression. He also has a more recent skin tear on the dorsal right hand that may have only happened in the last week. The patient had arterial studies done in 2017 in January which was 3 years ago. At that point he had noncompressible ABIs but really quite good TBI's both normal. Triphasic waveforms on the right monophasic at the left posterior tibial but triphasic at the left dorsalis pedis. His ABIs in our clinic today were both noncompressible 1/23; the patient has wounds on the right dorsal hand just distal to the wrist and on the left anterior lower extremity. Both of these look very healthy he is using Hydrofera Blue 1/30; left anterior lower extremity wound much smaller. Healthy looking surface. The laceration area just distal to the wrist on the dorsal hand on the right is also just about closed I used Hydrofera Blue here 2/6; left anterior lower extremity wound is much smaller but still open. The laceration area just distal to the wrist on the dorsal hand is fully epithelialized. 2/13; the patient's anterior lower extremity wound is closed. The laceration just distal to the wrist on the dorsal hand is also fully epithelialized and closed. The patient has external compression stockings which I think are 20/30 READMISSION 08/06/2019 [name] is a 74 year old man with had several times previously in our clinic. He is a diabetic with a history of chronic renal insufficiency status post kidney transplant in 1983 and again in 2005. He was then in 2017 with a laceration on the left lower extremity. He was worked up  at the time with arterial studies and reflux studies. The arterial studies showed ABIs to be noncompressible but TBI's were within normal limits. I do not have the reflux studies at the moment. He was also sent here in 2019 with a left lower  extremity wound and then again in 2020 with left lower extremity trauma a skin tear on the wrist. He was discharged with 20/30 stockings identified from myself that that might not be enough compression. Nevertheless he states he was wearing these fairly reliably. In September he had a fall with a substantial bruise in the area of the wound. He says he saw orthopedics and they told him there was some muscle strain sometime it later this opened into a wound. He has a fairly substantial wound on the right posterior calf. Satellite areas around this including medially and posteriorly. He has not worn his stockings since the injury Past medical history; includes chronic renal failure secondary to diabetes with kidney transplant x2, atrial fibrillation, heart failure with preserved ejection fraction, coronary artery disease. ABIs on the right in our clinic were once again noncompressible 08/13/2019 on evaluation today patient appears to be doing decently well with regard to his wound compared to last week's evaluation. Unfortunately he is still having a lot of discomfort at this point which is I think in some part due to the 3 layer compression wrap which is a little bit stronger I think for him. When he was here before we actually utilized a Kerlix and Coban wrap which he states seemed to got a little bit better. Nonetheless I think we can probably drop back to this in light of the discomfort that he had. Nonetheless the pain was not really right around the wound itself as much as it was around the ankle in particular. The Iodoflex does seem to have done well for him As the wound is appearing somewhat better today which is excellent news. 11/30; still complaining  of a lot of pain. Apparently arterial studies I ordered 2 weeks ago are below. I do not believe we have an appointment with vein and vascular as of yet; ABI Findings: +---------+------------------+-----+----------+--------+ Right Rt Pressure (mmHg)IndexWaveform Comment  +---------+------------------+-----+----------+--------+ PTA >254 1.50 monophasic  +---------+------------------+-----+----------+--------+ DP >254 1.50 monophasic  +---------+------------------+-----+----------+--------+ Great Toe68 0.40 Abnormal   +---------+------------------+-----+----------+--------+ +---------+------------------+-----+----------+-------+ Left Lt Pressure (mmHg)IndexWaveform Comment +---------+------------------+-----+----------+-------+ Brachial 169     +---------+------------------+-----+----------+-------+ PTA >254 1.50 monophasic  +---------+------------------+-----+----------+-------+ DP >254 1.50 monophasic  +---------+------------------+-----+----------+-------+ Great Toe40 0.24 Abnormal   +---------+------------------+-----+----------+-------+ Pedal arteries appear hyperemic. Patient refused Brachial pressure in the right arm. Summary: Right: Resting right ankle-brachial index indicates noncompressible right lower extremity arteries. The right toe- brachial index is abnormal. Left: Resting left ankle-brachial index indicates noncompressible left lower extremity arteries. The left toe-brachial index is abnormal. He is also having considerably more swelling in his left calf. This was not there when I last saw him 2 weeks ago. He tells me that some of the home health compression wraps have been slipping down and that may be the issue here however a month I am uncertain 57/8; sees vascular on December 22. Still complaining of a lot of pain. DVT study I did last time was negative for DVT 12/14; still complaining of pain which if this is  arterial is certainly claudication and rest keeps him uncomfortable at night. He has an appointment with vein and vascular on December 22. Wound surface is better using Iodoflex. Once the surface of this is satisfactory and we have exhausted the vascular route. Perhaps an advanced treatment option. He has a configuration of the venous ulceration although his arterial studies are not very good. The other issue is the patient has a transplanted kidney. This will make angiography difficult and challenging issue 12/21; still complaining of pain  and drainage. We are using Iodoflex on the wound under compression. He sees Dr. Donnetta Hutching tomorrow to evaluate his noninvasive studies noted above. He has a transplanted kidney further complicating the options for angiography. 12/28; still using Iodoflex under compression. I have Dr. Luther Parody note from 12/22. he noted arterial studies revealing monophasic waveforms at the pedal vessels bilaterally and calcified vessels making the ABI unreliable. He did not comment on the reduced TBI's. He felt these were venous wounds based on the palpable dorsalis pedis pulse. 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 And they arranged for formal venous duplex with reflux studies in the next several weeks. Follow-up with either Scot Dock or Dr. Oneida Alar 09/24/2019 still using Iodoflex under compression. He has an appointment with Dr. Doren Custard on 1/7 with regards to his venous disease. The patient was not felt to have a primary arterial problem for the nonhealing of his wound. We did attempt to wrap him with 3 layer when he first came into the clinic he complained of a lot of pain in the ankle although this may have been because the dressing fell down somewhat. He has far too edema fluid in the right leg for a good prognosis about healing this wound He comes in today with an excoriation on the bottom part of his right fifth toe. He thinks he may have done this taking off his clothes  and that something got caught on the toe. There is no open wound however the toe itself is very painful 1/11; we are using Iodoflex under compression. The wound bed is clean. He went on to see Dr. Doren Custard on 09/27/2019 he again feels that the patient's arterial supply is adequate. He feels that he might benefit from right greater saphenous vein ablation for a venous ulcer. In the meantime the area that he was complaining about last week on the right fifth toe. An x-ray that I ordered showed marked soft tissue swelling along the distal aspect of the right foot but there was no evidence of osteomyelitis. He comes in today with the fifth toenail just about coming off. He has black eschar underneath the toe on the plantar aspect. The toe was swollen red and very painful. In the right setting this could be a significant soft tissue infection versus ischemia of the toe itself. He did not show this to Dr. Doren Custard 1/18; I am using Iodoflex under compression a large wound on the right posterior calf. He had his right greater saphenous vein ablation by Dr. Doren Custard although I am not sure that is the only problem here. ooThe right fifth toe which was possibly trauma 2 weeks ago continues to be exceptionally painful with a necrotic tip. Maybe not quite as swollen. I started him empirically on Augmentin 5/125 1 p.o. twice daily last week after discussing this with Dr. Loletha Grayer of nephrology. Perhaps somewhat better this week but not as good as I was hoping. A plain x-ray was negative. He comes in today with an area on the medial right calf that was blistered and now open. In looking at things he appears to be systemically fluid volume overloaded. He has a transplanted kidney. He states he takes his Lasix variably when he has appointments he tries not to take it the later I am really not certain if he takes this reliably. However he has far too much edema fluid in the right leg to easily heal this wound and he appears to be  developing blisters medially to form additional wounds 1/25;  his CO2 angiogram was done by Dr. Doren Custard last week. This showed the proximal arteries all to be patent. On the right side the common femoral deep femoral superficial femoral popliteal anterior tibial and peroneal arteries were patent. The posterior tibial was occluded but reconstituted distally via collaterals from the peroneal artery he was felt he should have enough blood flow to heal his wounds including the right fifth toe. The right fifth toe looks better however he is still complaining of a lot of pain. The large area which is a venous ulceration posteriorly has a better looking surface I think we can switch to Hydrofera Blue today 2/1; the patient's original wounds on the right posterior medial calf has come down in width however superior to this he has new denuded areas and I am concerned we simply do not have enough edema control. He has already undergone a right greater saphenous ablation. He had a CO2 angiogram done by Dr. Doren Custard and the comment was that we should have enough blood flow to heal the venous wound however we simply do not seem to have enough edema control with 3 layer compression. The patient is status post kidney transplant although his exact renal function is not really clear. Nor am i sure what dose of diuretic he is supposed to be on. He also has the area on the right fifth toe which was unclear etiology but I think became secondarily infected I gave him 2 weeks of antibiotics for this and this seems to have settled down he still has a black eschar over the tip of the toe. X-ray was negative for fracture. He says he has a history of gout. 2/8; the patient's wound on the right posterior calf is about the same. The superficial area medially also about the same. He got a prescription for prednisoneo Gout after he developed erythema on the dorsal aspect of his left great toe going along with the right fifth toe  which has been problematic all along. He has not taken it because he is concerned about increasing CBGs. He has a transplanted kidney is already on prednisone 5 mg. He would not be a good candidate for NSAIDs. Perhaps colchicine. He is not aware of what his uric acid level is 2/15; his right posterior calf wound seems to be coming in in terms of width. Everything here looks fairly good. No mechanical debridement we have been using Hydrofera Blue. He has had an ablation by vein and vascular. Felt to have adequate arterial supply to this area. The patient got prednisone last week from Dr. Angelique Holm of nephrology. At my urging he actually took it. The area on his left toe was a lot better. The right toe was not as painful but still erythematous with a wound at the tip. We have been using silver alginate here 2/22; right posterior calf seems to be gradually epithelializing. Still a fairly substantial wound. He still has an area on the tip of his right fifth toe which I think was gout related. This is gradually improving. We have been using Unna boots to wrap X-ray I did of the foot last time was again negative there was soft tissue irregularity about the distal fifth digit but no radiographic evidence of bone damage 3/1; right posterior calf seems to be gradually improving however there were areas of hyper granulation. We have been using Hydrofera Blue. The hyper granulation is mostly evident in the most superior finger shape projection of the wound itself. ooThe area on the  right fifth toe also was slough covered and required debridement. 3/8 continued improvement in the right posterior calf and the tip of the right fifth toe. We have been using Hydrofera Blue under compression he is changing the area to the toe 3/22; continued improvement in the right posterior calf. Unfortunately comes in today with a new skin tear in the mid anterior tibia area this probably had something to do with changing  his dressing home health I called and left this a message last week. Objective Constitutional Sitting or standing Blood Pressure is within target range for patient.. Pulse regular and within target range for patient.Marland Kitchen Respirations regular, non-labored and within target range.. Temperature is normal and within the target range for the patient.Marland Kitchen Appears in no distress. Vitals Time Taken: 10:00 AM, Height: 67 in, Weight: 190 lbs, BMI: 29.8, Temperature: 98 F, Pulse: 53 bpm, Respiratory Rate: 19 breaths/min, Blood Pressure: 141/69 mmHg, Capillary Blood Glucose: 107 mg/dl. General Notes: patient stated CBG was 107 this morning General Notes: Wound exam; right fifth toe still somewhat swollen but it does not have an open area. ooThe large area on the posterior right calf continues to contract especially superiorly. Inferiorly he also has some epithelialization although there is also some hyper granulation. Integumentary (Hair, Skin) Wound #3 status is Open. Original cause of wound was Trauma. The wound is located on the Right,Posterior Lower Leg. The wound measures 13cm length x 4cm width x 0.1cm depth; 40.841cm^2 area and 4.084cm^3 volume. There is Fat Layer (Subcutaneous Tissue) Exposed exposed. There is no tunneling or undermining noted. There is a medium amount of serosanguineous drainage noted. The wound margin is distinct with the outline attached to the wound base. There is large (67-100%) pink, hyper - granulation within the wound bed. There is a small (1-33%) amount of necrotic tissue within the wound bed including Adherent Slough. Wound #5 status is Open. Original cause of wound was Blister. The wound is located on the Right Toe Fifth. The wound measures 0.5cm length x 0.5cm width x 0.1cm depth; 0.196cm^2 area and 0.02cm^3 volume. There is Fat Layer (Subcutaneous Tissue) Exposed exposed. There is no tunneling or undermining noted. There is a small amount of serous drainage noted. The  wound margin is distinct with the outline attached to the wound base. There is medium (34-66%) pink granulation within the wound bed. There is a medium (34-66%) amount of necrotic tissue within the wound bed including Adherent Slough. Wound #9 status is Open. Original cause of wound was Gradually Appeared. The wound is located on the Right,Anterior Lower Leg. The wound measures 1cm length x 0.5cm width x 0.1cm depth; 0.393cm^2 area and 0.039cm^3 volume. There is Fat Layer (Subcutaneous Tissue) Exposed exposed. There is no tunneling or undermining noted. There is a small amount of serosanguineous drainage noted. The wound margin is distinct with the outline attached to the wound base. There is large (67-100%) pink, pale granulation within the wound bed. There is no necrotic tissue within the wound bed. 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  There was a Haematologist Compression Therapy Procedure by [name], RN. Post procedure Diagnosis Wound #3: Same as Pre-Procedure Wound #9 Pre-procedure diagnosis of Wound #9 is a Skin Tear located on the Right,Anterior Lower Leg . There was a Haematologist Compression Therapy Procedure by [name], RN. Post procedure Diagnosis Wound #9: Same as Pre-Procedure Plan Follow-up Appointments: Return Appointment in 1 week. Dressing Change Frequency: Wound #3 Right,Posterior Lower Leg: Other: - twice a week - wound clinic to  change 1x a week on Monday, home health to change 1x a week on Thursday or Friday Wound #5 Right Toe Fifth: Other: - twice a week - wound clinic to change 1x a week on Monday, home health to change 1x a week on Thursday or Friday Wound #9 Right,Anterior Lower Leg: Other: - twice a week - wound clinic to change 1x a week on Monday, home health to change 1x a week on Thursday or Friday Skin Barriers/Peri-Wound Care: Barrier cream Moisturizing lotion Wound Cleansing: Clean wound with Normal Saline. - or normal saline on days that dressing is changed May shower with protection. Primary Wound Dressing: Wound #3 Right,Posterior Lower Leg: Hydrofera Blue - Classic Wound #5 Right Toe Fifth: Hydrofera Blue - Classic Wound #9 Right,Anterior Lower Leg: Hydrofera Blue - Classic Secondary Dressing: Wound #3 Right,Posterior Lower Leg: Foam - foam top bend of foot for protection ABD pad Zetuvit or Kerramax - or Xtrasorb Wound #5 Right Toe Fifth: Kerlix/Rolled Gauze Dry Gauze Wound #9 Right,Anterior Lower Leg: Dry Gauze Edema Control: Unna Boot to Right Lower Extremity - *****ONLY ONE SINGLE LAYER OF UNNA BOOT***** Avoid standing for long periods of time Elevate legs to the level of the heart or above for 30 minutes daily and/or when sitting, a frequency of: - throughout the day Exercise regularly Additional Orders / Instructions: Other: - Go to ER if toe/leg gets more red/black, increased pain, fever Other: - Make follow up appt with Kidney doctor regarding uncontrolled swelling Home Health: [facility] for wound care. - [facility] 1. I am continue with Hydrofera Blue to the right leg wound 2. Probable skin tear perhaps a laceration on the anterior leg related to removing the dressing. 3. He may require debridement on the lower part of the wound to address the hyper granulation. I may start off with silver nitrate next week Electronic Signature(s) Signed:  12/10/2019 5:34:39 PM By: [name] MD Entered By: [name] on 12/10/2019 11:26:59 -------------------------------------------------------------------------------- SuperBill Details Patient Name: Date of Service: [name], [name] 12/10/2019 Medical Record [id number] Patient Account Number: [id number] Date of Birth/Sex: Treating RN: 1946/04/16 (73 y.o. 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 L97.811 Non-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 Facility Procedures CPT4 Code Description: [id number] (Facility Use Only) [phone] - APPLY MULTLAY COMPRS LWR RT LEG Modifier: Quantity: 1 Physician Procedures CPT4 Code Description: [id number] - WC PHYS LEVEL 3 - EST PT ICD-10 Diagnosis Description L97.212 Non-pressure chronic ulcer of right calf with fat layer ex L97.811 Non-pressure chronic ulcer  of other part of right lower le skin I87.321 Chronic venous  hypertension (idiopathic) with inflammation extremity L97.518 Non-pressure chronic ulcer of other part of right foot wit Modifier: posed g limited to breakdo of right lower h other specified se Quantity: 1 wn of verity Electronic Signature(s) Signed: 12/10/2019 5:18:21 PM By: [name] RN, BSN Signed: 12/10/2019 5:34:39 PM By: [name] MD Entered By: [name] on 12/10/2019 13:12:12

## 2019-12-17 ENCOUNTER — Other Ambulatory Visit: Payer: Self-pay

## 2019-12-17 ENCOUNTER — Encounter (HOSPITAL_BASED_OUTPATIENT_CLINIC_OR_DEPARTMENT_OTHER): Payer: Medicare Other | Admitting: Internal Medicine

## 2019-12-17 DIAGNOSIS — S90121A Contusion of right lesser toe(s) without damage to nail, initial encounter: Secondary | ICD-10-CM | POA: Diagnosis not present
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
## 2019-12-21 NOTE — Progress Notes (Signed)
Michael, [name] ([id number]) Visit Report for 12/17/2019 Debridement Details Patient Name: Date of Service: [name], [name] 12/17/2019 9:45 AM Medical Record [id number] Patient Account Number: [id number] Date of Birth/Sex: 1946-04-08 (74 y.o. M) Treating RN: [name] Primary Care Provider: [name] Other Clinician: Referring Provider: Treating Provider/Extender:Lachlyn Vanderstelt, [name], [name] in Treatment: 19 Debridement Performed for Wound #5 Right Toe Fifth Assessment: Performed By: Physician Ricard Dillon., MD Debridement Type: Debridement Severity of Tissue Pre Fat layer exposed Debridement: Level of Consciousness (Pre- Awake and Alert procedure): Pre-procedure Verification/Time Out Taken: Yes - 11:00 Start Time: 11:01 Pain Control: Other : benzocaine 20% spray Total Area Debrided (L x W): 0.4 (cm) x 0.5 (cm) = 0.2 (cm) Tissue and other material Viable, Non-Viable, Slough, Subcutaneous, Slough debrided: Level: Skin/Subcutaneous Tissue Debridement Description: Excisional Instrument: Curette Bleeding: Minimum Hemostasis Achieved: Pressure End Time: 11:03 Procedural Pain: 7 Post Procedural Pain: 4 Response to Treatment: Procedure was tolerated well Level of Consciousness Awake and Alert (Post-procedure): Post Debridement Measurements of Total Wound Length: (cm) 0.4 Width: (cm) 0.5 Depth: (cm) 0.2 Volume: (cm) 0.031 Character of Wound/Ulcer Post Improved Debridement: Severity of Tissue Post Debridement: Fat layer exposed Post Procedure Diagnosis Same as Pre-procedure Electronic Signature(s) Signed: 12/17/2019 5:31:36 PM By: [name] MD Signed: 12/21/2019 5:28:43 PM By: [name] RN, BSN Entered By: [name] on 12/17/2019 11:25:44 -------------------------------------------------------------------------------- HPI Details Patient Name: Date of Service: [name], [name] 12/17/2019 9:45 AM Medical Record  [id number] Patient Account Number: [id number] Date of Birth/Sex: Treating RN: 22-Oct-1945 (74 y.o. [name] Primary Care Provider: [name] Other Clinician: Referring Provider: Treating Provider/Extender:[name], [name], [name] in Treatment: 19 History of Present Illness HPI Description: [name] HPI Description: 74 year old gentleman who was recently seen by his nephrologist [name], and noted to have a wound on his left lower extremity which was lacerated 2 months ago and now has reopened. The patient's left shin has a ulceration with some exudate but no evidence of infection and he was referred to [place] for further care as it was known that the patient has had some peripheral vascular disease in the past. Past medical history significant for chronic kidney disease, atrial fibrillation, diabetes mellitus,status post kidney transplant in 1983 and 2005, a week fistula graft placement, status post previous bowel surgery. 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 10/06/2015 -- x-ray of the left tibia and fibula shows no evidence of osteomyelitis. The patient has also had Doppler studies of his extremity and is awaiting the appointment with the vascular surgeon. We have not yet received these reports. 10/13/2015 -- lower extremity venous duplex reflux evaluation shows reflux in the left common femoral vein, left saphenofemoral junction and the proximal greater saphenous vein extending to the proximal calf. There is also reflux in the left proximal to mid small saphenous vein. Arterial duplex studies done showed the resting ABI was not applicable due to tibial artery medial calcification. 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 The right TBI was 1 which is normal and the left ABI  was 1 which is normal. Patient has otherwise been doing fine and has been compliant with his dressings. 10/20/2015 -- He was seen by [name] recently for a vascular opinion on 10/15/2015. His left lower extremity venous insufficiency duplex study revealed GSV reflux,SS vein reflux  and deep venous reflux in the common femoral vein. His ABIs were non compressible and his TBI on the right was 1.01 and on the left was 0.80. He was asked to continue with the wound care with compressive therapy followed by EVLA of the left GS vein 3 months. He recommended 20-30 mm thigh-high compression stockings and the need for a three-month trial of this. The patient had an Unna boot applied at the vascular office but he could not tolerate this with a lot of pain and issues with his toes and hence came here on Friday for removal of this and we reapplied a 2 layer compression. 11/10/2015 -- patient still has not purchased his 20-30 mm thigh-high compression stockings as prescribed by [name]. Readmission: 08/08/18 on evaluation today patient presents for readmission concerning a new injury to the left anterior lower extremity. He was previously seen in 2017 here in our clinic. He states that he has done fairly well since that point. Nonetheless he is having at this time some pain but states that he hit this on a table that fell over and actually struck his leg. This appears to have pulled back some of his skin which folded in on itself and is causing some difficulty as far as that is concerned. There does not appear to be any evidence of infection at this time. No fevers, chills, nausea, or vomiting noted at this time. He's been using dressings on his own currently without complication. 08/15/18 on evaluation today patient actually appears to be doing somewhat better in regard to his wound of the lower [name] when compared to the first visit last week. I had to do a much more extensive debridement at that  time it does appear that I'm gonna have to perform some debridement today but it does not look to be as extensive by any means. Nonetheless fortunately he does not show any signs of infection he does have discomfort at this site. I believe based on what I'm seeing currently he may benefit from Iodoflex to help keep the wound bed clean. Patient tolerated therapy without complication. Upon evaluation today the patient actually appears to be doing excellent in regard to his left lower extremity ulcer. This is much better than the previous two visits where he had a lot of necrotic tissue around the edge of the wound simply due to the fact that again there was a significant skin tear where the edge had been cleared away prior to reattaching and being able to heal appropriately. He seems to be doing much better at this point. 08/28/18 on evaluation today the patient's wound actually does appear to be showing signs of improvement. With that being said though he is improving he would likely note even greater improvement if we were able to sharply debride the wound. Nonetheless this caused him to much discomfort he tells me. 09/04/18 on evaluation today patient actually appears to be showing signs of improvement in regard to his left lower extremity ulcer. He has been tolerating the dressing changes including the wrap although he tells me at this point that the burning does last for a couple of days even with just the Iodoflex. I was afraid that this may been part of the issue that he was having with discomfort. It does seem to be the case. Nonetheless he shows no signs of evidence of infection at this time which is good news. No fevers chills noted ADMISSION to [facility] wound care clinic 10/05/2018 This is a  patient who was cared for in 2017 and again in the fall of this year at our sister clinic and [place]. He actually lives in [place] in [place]. We have been dealing with an apparent traumatic  area on the left anterior tibial area. This is been present for the last several months. He was supposed to be using Iodoflex Kerlix and Coban however he was hospitalized from 09/05/2018 through 09/11/2018 with delirium secondary to pneumonia. Since then he is only been putting Vaseline gauze on this without compression. He also has a more recent skin tear on the dorsal right hand that may have only happened in the last week. The patient had arterial studies done in 2017 in January which was 3 years ago. At that point he had noncompressible ABIs but really quite good TBI's both normal. Triphasic waveforms on the right monophasic at the left posterior tibial but triphasic at the left dorsalis pedis. His ABIs in our clinic today were both noncompressible 1/23; the patient has wounds on the right dorsal hand just distal to the wrist and on the left anterior lower extremity. Both of these look very healthy he is using Hydrofera Blue 1/30; left anterior lower extremity wound much smaller. Healthy looking surface. The laceration area just distal to the wrist on the dorsal hand on the right is also just about closed I used Hydrofera Blue here 2/6; left anterior lower extremity wound is much smaller but still open. The laceration area just distal to the wrist on the dorsal hand is fully epithelialized. 2/13; the patient's anterior lower extremity wound is closed. The laceration just distal to the wrist on the dorsal hand is also fully epithelialized and closed. The patient has external compression stockings which I think are 20/30 READMISSION 08/06/2019 Mr. Proby is a 74 year old man with had several times previously in our clinic. He is a diabetic with a history of chronic renal insufficiency status post kidney transplant in 1983 and again in 2005. He was then in 2017 with a laceration on the left lower extremity. He was worked up at the time with arterial studies and reflux studies. The arterial  studies showed ABIs to be noncompressible but TBI's were within normal limits. I do not have the reflux studies at the moment. He was also sent here in 2019 with a left lower extremity wound and then again in 2020 with left lower extremity trauma a skin tear on the wrist. He was discharged with 20/30 stockings identified from myself that that might not be enough compression. Nevertheless he states he was wearing these fairly reliably. In September he had a fall with a substantial bruise in the area of the wound. He says he saw orthopedics and they told him there was some muscle strain sometime it later this opened into a wound. He has a fairly substantial wound on the right posterior calf. Satellite areas around this including medially and posteriorly. He has not worn his stockings since the injury Past medical history; includes chronic renal failure secondary to diabetes with kidney transplant x2, atrial fibrillation, heart failure with preserved ejection fraction, coronary artery disease. ABIs on the right in our clinic were once again noncompressible 08/13/2019 on evaluation today patient appears to be doing decently well with regard to his wound compared to last week's evaluation. Unfortunately he is still having a lot of discomfort at this point which is I think in some part due to the 3 layer compression wrap which is a little bit stronger I think for  him. When he was here before we actually utilized a Kerlix and Coban wrap which he states seemed to got a little bit better. Nonetheless I think we can probably drop back to this in light of the discomfort that he had. Nonetheless the pain was not really right around the wound itself as much as it was around the ankle in particular. The Iodoflex does seem to have done well for him As the wound is appearing somewhat better today which is excellent news. 11/30; still complaining of a lot of pain. Apparently arterial studies I ordered 2 weeks ago  are below. I do not believe we have an appointment with vein and vascular as of yet; ABI Findings: +---------+------------------+-----+----------+--------+ Right Rt Pressure (mmHg)IndexWaveform Comment  +---------+------------------+-----+----------+--------+ PTA >254 1.50 monophasic  +---------+------------------+-----+----------+--------+ DP >254 1.50 monophasic  +---------+------------------+-----+----------+--------+ Great Toe68 0.40 Abnormal   +---------+------------------+-----+----------+--------+ +---------+------------------+-----+----------+-------+ Left Lt Pressure (mmHg)IndexWaveform Comment +---------+------------------+-----+----------+-------+ Brachial 169     +---------+------------------+-----+----------+-------+ PTA >254 1.50 monophasic  +---------+------------------+-----+----------+-------+ DP >254 1.50 monophasic  +---------+------------------+-----+----------+-------+ Great Toe40 0.24 Abnormal   +---------+------------------+-----+----------+-------+ Pedal arteries appear hyperemic. Patient refused Brachial pressure in the right arm. Summary: Right: Resting right ankle-brachial index indicates noncompressible right lower extremity arteries. The right toe- brachial index is abnormal. Left: Resting left ankle-brachial index indicates noncompressible left lower extremity arteries. The left toe-brachial index is abnormal. He is also having considerably more swelling in his left calf. This was not there when I last saw him 2 weeks ago. He tells me that some of the home health compression wraps have been slipping down and that may be the issue here however a month I am uncertain 38/2; sees vascular on December 22. Still complaining of a lot of pain. DVT study I did last time was negative for DVT 12/14; still complaining of pain which if this is arterial is certainly claudication and rest keeps him uncomfortable  at night. He has an appointment with vein and vascular on December 22. Wound surface is better using Iodoflex. Once the surface of this is satisfactory and we have exhausted the vascular route. Perhaps an advanced treatment option. He has a configuration of the venous ulceration although his arterial studies are not very good. The other issue is the patient has a transplanted kidney. This will make angiography difficult and challenging issue 12/21; still complaining of pain and drainage. We are using Iodoflex on the wound under compression. He sees Dr. Donnetta Hutching tomorrow to evaluate his noninvasive studies noted above. He has a transplanted kidney further complicating the options for angiography. 12/28; still using Iodoflex under compression. I have Dr. Luther Parody note from 12/22. he noted arterial studies revealing monophasic waveforms at the pedal vessels bilaterally and calcified vessels making the ABI unreliable. He did not comment on the reduced TBI's. He felt these were venous wounds based on the palpable dorsalis pedis pulse. 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 And they arranged for formal venous duplex with reflux studies in the next several weeks. Follow-up with either Scot Dock or Dr. Oneida Alar 09/24/2019 still using Iodoflex under compression. He has an appointment with Dr. Doren Custard on 1/7 with regards to his venous disease. The patient was not felt to have a primary arterial problem for the nonhealing of his wound. We did attempt to wrap him with 3 layer when he first came into the clinic he complained of a lot of pain in the ankle although this may have been because the dressing fell down somewhat. He has far too edema fluid in the right  leg for a good prognosis about healing this wound He comes in today with an excoriation on the bottom part of his right fifth toe. He thinks he may have done this taking off his clothes and that something got caught on the toe. There is no open wound  however the toe itself is very painful 1/11; we are using Iodoflex under compression. The wound bed is clean. He went on to see Dr. Doren Custard on 09/27/2019 he again feels that the patient's arterial supply is adequate. He feels that he might benefit from right greater saphenous vein ablation for a venous ulcer. In the meantime the area that he was complaining about last week on the right fifth toe. An x-ray that I ordered showed marked soft tissue swelling along the distal aspect of the right foot but there was no evidence of osteomyelitis. He comes in today with the fifth toenail just about coming off. He has black eschar underneath the toe on the plantar aspect. The toe was swollen red and very painful. In the right setting this could be a significant soft tissue infection versus ischemia of the toe itself. He did not show this to Dr. Doren Custard 1/18; I am using Iodoflex under compression a large wound on the right posterior calf. He had his right greater saphenous vein ablation by Dr. Doren Custard although I am not sure that is the only problem here. The right fifth toe which was possibly trauma 2 weeks ago continues to be exceptionally painful with a necrotic tip. Maybe not quite as swollen. I started him empirically on Augmentin 5/125 1 p.o. twice daily last week after discussing this with Dr. Loletha Grayer of nephrology. Perhaps somewhat better this week but not as good as I was hoping. A plain x-ray was negative. He comes in today with an area on the medial right calf that was blistered and now open. In looking at things he appears to be systemically fluid volume overloaded. He has a transplanted kidney. He states he takes his Lasix variably when he has appointments he tries not to take it the later I am really not certain if he takes this reliably. However he has far too much edema fluid in the right leg to easily heal this wound and he appears to be developing blisters medially to form additional wounds 1/25; his  CO2 angiogram was done by Dr. Doren Custard last week. This showed the proximal arteries all to be patent. On the right side the common femoral deep femoral superficial femoral popliteal anterior tibial and peroneal arteries were patent. The posterior tibial was occluded but reconstituted distally via collaterals from the peroneal artery he was felt he should have enough blood flow to heal his wounds including the right fifth toe. The right fifth toe looks better however he is still complaining of a lot of pain. The large area which is a venous ulceration posteriorly has a better looking surface I think we can switch to Hydrofera Blue today 2/1; the patient's original wounds on the right posterior medial calf has come down in width however superior to this he has new denuded areas and I am concerned we simply do not have enough edema control. He has already undergone a right greater saphenous ablation. He had a CO2 angiogram done by Dr. Doren Custard and the comment was that we should have enough blood flow to heal the venous wound however we simply do not seem to have enough edema control with 3 layer compression. The patient is status post kidney  transplant although his exact renal function is not really clear. Nor am i sure what dose of diuretic he is supposed to be on. He also has the area on the right fifth toe which was unclear etiology but I think became secondarily infected I gave him 2 weeks of antibiotics for this and this seems to have settled down he still has a black eschar over the tip of the toe. X-ray was negative for fracture. He says he has a history of gout. 2/8; the patient's wound on the right posterior calf is about the same. The superficial area medially also about the same. He got a prescription for prednisoneo Gout after he developed erythema on the dorsal aspect of his left great toe going along with the right fifth toe which has been problematic all along. He has not taken it because he  is concerned about increasing CBGs. He has a transplanted kidney is already on prednisone 5 mg. He would not be a good candidate for NSAIDs. Perhaps colchicine. He is not aware of what his uric acid level is 2/15; his right posterior calf wound seems to be coming in in terms of width. Everything here looks fairly good. No mechanical debridement we have been using Hydrofera Blue. He has had an ablation by vein and vascular. Felt to have adequate arterial supply to this area. The patient got prednisone last week from Dr. Angelique Holm of nephrology. At my urging he actually took it. The area on his left toe was a lot better. The right toe was not as painful but still erythematous with a wound at the tip. We have been using silver alginate here 2/22; right posterior calf seems to be gradually epithelializing. Still a fairly substantial wound. He still has an area on the tip of his right fifth toe which I think was gout related. This is gradually improving. We have been using Unna boots to wrap X-ray I did of the foot last time was again negative there was soft tissue irregularity about the distal fifth digit but no radiographic evidence of bone damage 3/1; right posterior calf seems to be gradually improving however there were areas of hyper granulation. We have been using Hydrofera Blue. The hyper granulation is mostly evident in the most superior finger shape projection of the wound itself. The area on the right fifth toe also was slough covered and required debridement. 3/8 continued improvement in the right posterior calf and the tip of the right fifth toe. We have been using Hydrofera Blue under compression he is changing the area to the toe 3/22; continued improvement in the right posterior calf. Unfortunately comes in today with a new skin tear in the mid anterior tibia area this probably had something to do with changing his dressing home health I called and left this a message last  week. 3/29; continued improvement in the a substantial wound on the right posterior calf. The skin tear anteriorly from last week is already healed on the tip of the right fifth toe there is still a nonviable area. Electronic Signature(s) Signed: 12/17/2019 5:31:36 PM By: [name] MD Entered By: [name] on 12/17/2019 11:27:01 -------------------------------------------------------------------------------- Physical Exam Details Patient Name: Date of Service: [name], [name] 12/17/2019 9:45 AM Medical Record [name]:[id number] Patient Account Number: [id number] Date of Birth/Sex: Treating RN: 1946/01/14 (74 y.o. [name] Primary Care Provider: [name] Other Clinician: Referring Provider: Treating Provider/Extender:Pelagia Iacobucci, [name], [name] in Treatment: 82 Constitutional Patient is hypertensive.. Pulse regular and within  target range for patient.Marland Kitchen Respirations regular, non-labored and within target range.. Temperature is normal and within the target range for the patient.Marland Kitchen Appears in no distress. Notes Wound exam; right fifth toe has an open area at the tip of this. I used a #5 curette to remove the debris from the center there is still some depth. Our nurses have been defining an open area here every week The large area on the posterior calf is contracted especially superiorly there is some hyper granulation inferiorly but I did not think this needed mechanical debridement There is no evidence of infection of either wound area the toe itself is a lot less swollen Electronic Signature(s) Signed: 12/17/2019 5:31:36 PM By: [name] MD Entered By: [name] on 12/17/2019 11:29:04 -------------------------------------------------------------------------------- Physician Orders Details Patient Name: Date of Service: ETHANAEL, VEITH 12/17/2019 9:45 AM Medical Record [id number] Patient Account Number: [id number] Date of  Birth/Sex: Treating RN: Jul 03, 1946 (74 y.o. [name] Primary Care Provider: [name] Other Clinician: Referring Provider: Treating Provider/Extender:Bennett Ram, [name], [name] in Treatment: 62 Verbal / Phone Orders: No Diagnosis Coding ICD-10 Coding Code Description S80.11XD Contusion of right lower leg, subsequent encounter L97.212 Non-pressure chronic ulcer of right calf with fat layer exposed I87.321 Chronic venous hypertension (idiopathic) with inflammation of right lower extremity E11.622 Type 2 diabetes mellitus with other skin ulcer L97.518 Non-pressure chronic ulcer of other part of right foot with other specified severity L97.811 Non-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 Follow-up Appointments Return Appointment in 1 week. Dressing Change Frequency Wound #3 Right,Posterior Lower Leg Other: - twice a week - wound clinic to change 1x a week on Monday, home health to change 1x a week on Thursday or Friday Wound #5 Right Toe Fifth Other: - twice a week - wound clinic to change 1x a week on Monday, home health to change 1x a week on Thursday or Friday Skin Barriers/Peri-Wound Care Wound #3 Right,Posterior Lower Leg Barrier cream Moisturizing lotion - to leg Wound Cleansing Clean wound with Normal Saline. - or normal saline on days that dressing is changed May shower with protection. Primary Wound Dressing Wound #3 Right,Posterior Lower Leg Hydrofera Blue - Classic Wound #5 Right Toe Fifth Hydrofera Blue - Classic Secondary Dressing Wound #3 Right,Posterior Lower Leg Foam - foam top bend of foot for protection ABD pad Zetuvit or Kerramax - or Xtrasorb Wound #5 Right Toe Fifth Kerlix/Rolled Gauze Dry Gauze Other: - felt or rolled up gauze under toe to elevate tip of toe off of shoe Edema Control Unna Boot to Right Lower Extremity - *****ONLY ONE SINGLE LAYER OF UNNA BOOT***** - foam  to achilles to pad, unna, kerlix, coban Avoid standing for long periods of time Elevate legs to the level of the heart or above for 30 minutes daily and/or when sitting, a frequency of: - throughout the day Exercise regularly Additional Orders / Instructions Other: - Go to ER if toe/leg gets more red/black, increased pain, fever [place] [facility] for wound care. - [facility] Electronic Signature(s) Signed: 12/17/2019 5:31:36 PM By: [name] MD Signed: 12/17/2019 5:31:53 PM By: [name] RN, BSN Entered By: [name] on 12/17/2019 11:06:19 -------------------------------------------------------------------------------- Problem List Details Patient Name: Date of Service: KESHAWN, [name] 12/17/2019 9:45 AM Medical Record [id number] Patient Account Number: [id number] Date of Birth/Sex: Treating RN: 02-19-1946 (73 y.o. [name] Primary Care Provider: [name] Other Clinician: Referring Provider: Treating Provider/Extender:Obie Silos,  Ouida Sills in Treatment: 19 Active Problems ICD-10 Evaluated Encounter Code Description Active Date Today Diagnosis S80.11XD Contusion of right lower leg, subsequent encounter 08/06/2019 No Yes L97.212 Non-pressure chronic ulcer of right calf with fat layer 08/06/2019 No Yes exposed I87.321 Chronic venous hypertension (idiopathic) with 08/06/2019 No Yes inflammation of right lower extremity E11.622 Type 2 diabetes mellitus with other skin ulcer 08/06/2019 No Yes L97.518 Non-pressure chronic ulcer of other part of right foot 10/01/2019 No Yes with other specified severity L97.811 Non-pressure chronic ulcer of other part of right lower 12/10/2019 No Yes leg limited to breakdown of skin Inactive Problems Resolved Problems Electronic Signature(s) Signed: 12/17/2019 5:31:36 PM By: [name] MD Entered By: [name] on 12/17/2019  11:25:02 -------------------------------------------------------------------------------- Progress Note Details Patient Name: Date of Service: [name], [name] 12/17/2019 9:45 AM Medical Record [id number] Patient Account Number: [id number] Date of Birth/Sex: Treating RN: Jan 27, 1946 (74 y.o. [name] Primary Care Provider: [name] Other Clinician: Referring Provider: Treating Provider/Extender:Geza Beranek, [name], [name] in Treatment: 19 Subjective History of Present Illness (HPI) [place] HPI Description: 74 year old gentleman who was recently seen by his nephrologist [name], and noted to have a wound on his left lower extremity which was lacerated 2 months ago and now has reopened. The patient's left shin has a ulceration with some exudate but no evidence of infection and he was referred to [place] for further care as it was known that the patient has had some peripheral vascular disease in the past. Past medical history significant for chronic kidney disease, atrial fibrillation, diabetes mellitus,status post kidney transplant in 1983 and 2005, a week fistula graft placement, status post previous bowel surgery. 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 10/06/2015 -- x-ray of the left tibia and fibula shows no evidence of osteomyelitis. The patient has also had Doppler studies of his extremity and is awaiting the appointment with the vascular surgeon. We have not yet received these reports. 10/13/2015 -- lower extremity venous duplex reflux evaluation shows reflux in the left common femoral vein, left saphenofemoral junction and the proximal greater saphenous vein extending to the proximal calf. There is also reflux in the left proximal to mid small saphenous vein. Arterial duplex studies done showed the resting ABI was not applicable due to tibial artery medial calcification. 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 The right TBI was 1 which is normal and the left ABI was 1 which is normal. Patient has otherwise been doing fine and has been compliant with his dressings. 10/20/2015 -- He was seen by [name] recently for a vascular opinion on 10/15/2015. His left lower extremity venous insufficiency duplex study revealed GSV reflux,SS vein reflux and deep venous reflux in the common femoral vein. His ABIs were non compressible and his TBI on the right was 1.01 and on the left was 0.80. He was asked to continue with the wound care with compressive therapy followed by EVLA of the left GS vein 3 months. He recommended 20-30 mm thigh-high compression stockings and the need for a three-month trial of this. The patient had an Unna boot applied at the vascular office but he could not tolerate this with a lot of pain and issues with his toes and hence came here on Friday for removal of this and we reapplied  a 2 layer compression. 11/10/2015 -- patient still has not purchased his 20-30 mm thigh-high compression stockings as prescribed by [name]. Readmission: 08/08/18 on evaluation today patient presents for readmission concerning a new injury to the left anterior lower extremity. He was previously seen in 2017 here in our clinic. He states that he has done fairly well since that point. Nonetheless he is having at this time some pain but states that he hit this on a table that fell over and actually struck his leg. This appears to have pulled back some of his skin which folded in on itself and is causing some difficulty as far as that is concerned. There does not appear to be any evidence of infection at this time. No fevers, chills, nausea, or vomiting noted at this time. He's been using dressings on his own currently without complication. 08/15/18 on evaluation  today patient actually appears to be doing somewhat better in regard to his wound of the lower [name] when compared to the first visit last week. I had to do a much more extensive debridement at that time it does appear that I'm gonna have to perform some debridement today but it does not look to be as extensive by any means. Nonetheless fortunately he does not show any signs of infection he does have discomfort at this site. I believe based on what I'm seeing currently he may benefit from Iodoflex to help keep the wound bed clean. Patient tolerated therapy without complication. Upon evaluation today the patient actually appears to be doing excellent in regard to his left lower extremity ulcer. This is much better than the previous two visits where he had a lot of necrotic tissue around the edge of the wound simply due to the fact that again there was a significant skin tear where the edge had been cleared away prior to reattaching and being able to heal appropriately. He seems to be doing much better at this point. 08/28/18 on evaluation today the patient's wound actually does appear to be showing signs of improvement. With that being said though he is improving he would likely note even greater improvement if we were able to sharply debride the wound. Nonetheless this caused him to much discomfort he tells me. 09/04/18 on evaluation today patient actually appears to be showing signs of improvement in regard to his left lower extremity ulcer. He has been tolerating the dressing changes including the wrap although he tells me at this point that the burning does last for a couple of days even with just the Iodoflex. I was afraid that this may been part of the issue that he was having with discomfort. It does seem to be the case. Nonetheless he shows no signs of evidence of infection at this time which is good news. No fevers chills noted ADMISSION to [name] 10/05/2018 This is  a patient who was cared for in 2017 and again in the fall of this year at our sister clinic and [facility]. He actually lives in [place] in [place]. We have been dealing with an apparent traumatic area on the left anterior tibial area. This is been present for the last several months. He was supposed to be using Iodoflex Kerlix and Coban however he was hospitalized from 09/05/2018 through 09/11/2018 with delirium secondary to pneumonia. Since then he is only been putting Vaseline gauze on this without compression. He also has a more recent skin tear on the dorsal right hand that may have only  happened in the last week. The patient had arterial studies done in 2017 in January which was 3 years ago. At that point he had noncompressible ABIs but really quite good TBI's both normal. Triphasic waveforms on the right monophasic at the left posterior tibial but triphasic at the left dorsalis pedis. His ABIs in our clinic today were both noncompressible 1/23; the patient has wounds on the right dorsal hand just distal to the wrist and on the left anterior lower extremity. Both of these look very healthy he is using Hydrofera Blue 1/30; left anterior lower extremity wound much smaller. Healthy looking surface. The laceration area just distal to the wrist on the dorsal hand on the right is also just about closed I used Hydrofera Blue here 2/6; left anterior lower extremity wound is much smaller but still open. The laceration area just distal to the wrist on the dorsal hand is fully epithelialized. 2/13; the patient's anterior lower extremity wound is closed. The laceration just distal to the wrist on the dorsal hand is also fully epithelialized and closed. The patient has external compression stockings which I think are 20/30 READMISSION 08/06/2019 Mr. Lemming is a 74 year old man with had several times previously in our clinic. He is a diabetic with a history of chronic renal insufficiency status post  kidney transplant in 1983 and again in 2005. He was then in 2017 with a laceration on the left lower extremity. He was worked up at the time with arterial studies and reflux studies. The arterial studies showed ABIs to be noncompressible but TBI's were within normal limits. I do not have the reflux studies at the moment. He was also sent here in 2019 with a left lower extremity wound and then again in 2020 with left lower extremity trauma a skin tear on the wrist. He was discharged with 20/30 stockings identified from myself that that might not be enough compression. Nevertheless he states he was wearing these fairly reliably. In September he had a fall with a substantial bruise in the area of the wound. He says he saw orthopedics and they told him there was some muscle strain sometime it later this opened into a wound. He has a fairly substantial wound on the right posterior calf. Satellite areas around this including medially and posteriorly. He has not worn his stockings since the injury Past medical history; includes chronic renal failure secondary to diabetes with kidney transplant x2, atrial fibrillation, heart failure with preserved ejection fraction, coronary artery disease. ABIs on the right in our clinic were once again noncompressible 08/13/2019 on evaluation today patient appears to be doing decently well with regard to his wound compared to last week's evaluation. Unfortunately he is still having a lot of discomfort at this point which is I think in some part due to the 3 layer compression wrap which is a little bit stronger I think for him. When he was here before we actually utilized a Kerlix and Coban wrap which he states seemed to got a little bit better. Nonetheless I think we can probably drop back to this in light of the discomfort that he had. Nonetheless the pain was not really right around the wound itself as much as it was around the ankle in particular. The Iodoflex does  seem to have done well for him As the wound is appearing somewhat better today which is excellent news. 11/30; still complaining of a lot of pain. Apparently arterial studies I ordered 2 weeks ago are below. I do  not believe we have an appointment with vein and vascular as of yet; ABI Findings: +---------+------------------+-----+----------+--------+ Right Rt Pressure (mmHg)IndexWaveform Comment  +---------+------------------+-----+----------+--------+ PTA >254 1.50 monophasic  +---------+------------------+-----+----------+--------+ DP >254 1.50 monophasic  +---------+------------------+-----+----------+--------+ Great Toe68 0.40 Abnormal   +---------+------------------+-----+----------+--------+ +---------+------------------+-----+----------+-------+ Left Lt Pressure (mmHg)IndexWaveform Comment +---------+------------------+-----+----------+-------+ Brachial 169     +---------+------------------+-----+----------+-------+ PTA >254 1.50 monophasic  +---------+------------------+-----+----------+-------+ DP >254 1.50 monophasic  +---------+------------------+-----+----------+-------+ Great Toe40 0.24 Abnormal   +---------+------------------+-----+----------+-------+ Pedal arteries appear hyperemic. Patient refused Brachial pressure in the right arm. Summary: Right: Resting right ankle-brachial index indicates noncompressible right lower extremity arteries. The right toe- brachial index is abnormal. Left: Resting left ankle-brachial index indicates noncompressible left lower extremity arteries. The left toe-brachial index is abnormal. He is also having considerably more swelling in his left calf. This was not there when I last saw him 2 weeks ago. He tells me that some of the home health compression wraps have been slipping down and that may be the issue here however a month I am uncertain 48/2; sees vascular on December 22. Still  complaining of a lot of pain. DVT study I did last time was negative for DVT 12/14; still complaining of pain which if this is arterial is certainly claudication and rest keeps him uncomfortable at night. He has an appointment with vein and vascular on December 22. Wound surface is better using Iodoflex. Once the surface of this is satisfactory and we have exhausted the vascular route. Perhaps an advanced treatment option. He has a configuration of the venous ulceration although his arterial studies are not very good. The other issue is the patient has a transplanted kidney. This will make angiography difficult and challenging issue 12/21; still complaining of pain and drainage. We are using Iodoflex on the wound under compression. He sees Dr. Donnetta Hutching tomorrow to evaluate his noninvasive studies noted above. He has a transplanted kidney further complicating the options for angiography. 12/28; still using Iodoflex under compression. I have Dr. Luther Parody note from 12/22. he noted arterial studies revealing monophasic waveforms at the pedal vessels bilaterally and calcified vessels making the ABI unreliable. He did not comment on the reduced TBI's. He felt these were venous wounds based on the palpable dorsalis pedis pulse. 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 And they arranged for formal venous duplex with reflux studies in the next several weeks. Follow-up with either Scot Dock or Dr. Oneida Alar 09/24/2019 still using Iodoflex under compression. He has an appointment with Dr. Doren Custard on 1/7 with regards to his venous disease. The patient was not felt to have a primary arterial problem for the nonhealing of his wound. We did attempt to wrap him with 3 layer when he first came into the clinic he complained of a lot of pain in the ankle although this may have been because the dressing fell down somewhat. He has far too edema fluid in the right leg for a good prognosis about healing this wound He comes  in today with an excoriation on the bottom part of his right fifth toe. He thinks he may have done this taking off his clothes and that something got caught on the toe. There is no open wound however the toe itself is very painful 1/11; we are using Iodoflex under compression. The wound bed is clean. He went on to see Dr. Doren Custard on 09/27/2019 he again feels that the patient's arterial supply is adequate. He feels that he might benefit from right greater saphenous vein ablation for a venous ulcer. In the  meantime the area that he was complaining about last week on the right fifth toe. An x-ray that I ordered showed marked soft tissue swelling along the distal aspect of the right foot but there was no evidence of osteomyelitis. He comes in today with the fifth toenail just about coming off. He has black eschar underneath the toe on the plantar aspect. The toe was swollen red and very painful. In the right setting this could be a significant soft tissue infection versus ischemia of the toe itself. He did not show this to Dr. Doren Custard 1/18; I am using Iodoflex under compression a large wound on the right posterior calf. He had his right greater saphenous vein ablation by Dr. Doren Custard although I am not sure that is the only problem here. ooThe right fifth toe which was possibly trauma 2 weeks ago continues to be exceptionally painful with a necrotic tip. Maybe not quite as swollen. I started him empirically on Augmentin 5/125 1 p.o. twice daily last week after discussing this with Dr. Loletha Grayer of nephrology. Perhaps somewhat better this week but not as good as I was hoping. A plain x-ray was negative. He comes in today with an area on the medial right calf that was blistered and now open. In looking at things he appears to be systemically fluid volume overloaded. He has a transplanted kidney. He states he takes his Lasix variably when he has appointments he tries not to take it the later I am really not certain if he  takes this reliably. However he has far too much edema fluid in the right leg to easily heal this wound and he appears to be developing blisters medially to form additional wounds 1/25; his CO2 angiogram was done by Dr. Doren Custard last week. This showed the proximal arteries all to be patent. On the right side the common femoral deep femoral superficial femoral popliteal anterior tibial and peroneal arteries were patent. The posterior tibial was occluded but reconstituted distally via collaterals from the peroneal artery he was felt he should have enough blood flow to heal his wounds including the right fifth toe. The right fifth toe looks better however he is still complaining of a lot of pain. The large area which is a venous ulceration posteriorly has a better looking surface I think we can switch to Hydrofera Blue today 2/1; the patient's original wounds on the right posterior medial calf has come down in width however superior to this he has new denuded areas and I am concerned we simply do not have enough edema control. He has already undergone a right greater saphenous ablation. He had a CO2 angiogram done by Dr. Doren Custard and the comment was that we should have enough blood flow to heal the venous wound however we simply do not seem to have enough edema control with 3 layer compression. The patient is status post kidney transplant although his exact renal function is not really clear. Nor am i sure what dose of diuretic he is supposed to be on. He also has the area on the right fifth toe which was unclear etiology but I think became secondarily infected I gave him 2 weeks of antibiotics for this and this seems to have settled down he still has a black eschar over the tip of the toe. X-ray was negative for fracture. He says he has a history of gout. 2/8; the patient's wound on the right posterior calf is about the same. The superficial area medially also about the same.  He got a prescription for  prednisoneo Gout after he developed erythema on the dorsal aspect of his left great toe going along with the right fifth toe which has been problematic all along. He has not taken it because he is concerned about increasing CBGs. He has a transplanted kidney is already on prednisone 5 mg. He would not be a good candidate for NSAIDs. Perhaps colchicine. He is not aware of what his uric acid level is 2/15; his right posterior calf wound seems to be coming in in terms of width. Everything here looks fairly good. No mechanical debridement we have been using Hydrofera Blue. He has had an ablation by vein and vascular. Felt to have adequate arterial supply to this area. The patient got prednisone last week from Dr. Angelique Holm of nephrology. At my urging he actually took it. The area on his left toe was a lot better. The right toe was not as painful but still erythematous with a wound at the tip. We have been using silver alginate here 2/22; right posterior calf seems to be gradually epithelializing. Still a fairly substantial wound. He still has an area on the tip of his right fifth toe which I think was gout related. This is gradually improving. We have been using Unna boots to wrap X-ray I did of the foot last time was again negative there was soft tissue irregularity about the distal fifth digit but no radiographic evidence of bone damage 3/1; right posterior calf seems to be gradually improving however there were areas of hyper granulation. We have been using Hydrofera Blue. The hyper granulation is mostly evident in the most superior finger shape projection of the wound itself. ooThe area on the right fifth toe also was slough covered and required debridement. 3/8 continued improvement in the right posterior calf and the tip of the right fifth toe. We have been using Hydrofera Blue under compression he is changing the area to the toe 3/22; continued improvement in the right posterior calf.  Unfortunately comes in today with a new skin tear in the mid anterior tibia area this probably had something to do with changing his dressing home health I called and left this a message last week. 3/29; continued improvement in the a substantial wound on the right posterior calf. The skin tear anteriorly from last week is already healed on the tip of the right fifth toe there is still a nonviable area. Objective Constitutional Patient is hypertensive.. Pulse regular and within target range for patient.Marland Kitchen Respirations regular, non-labored and within target range.. Temperature is normal and within the target range for the patient.Marland Kitchen Appears in no distress. Vitals Time Taken: 9:50 AM, Height: 67 in, Weight: 190 lbs, BMI: 29.8, Temperature: 98.3 F, Pulse: 51 bpm, Respiratory Rate: 19 breaths/min, Blood Pressure: 147/75 mmHg, Capillary Blood Glucose: 98 mg/dl. General Notes: patient stated CBG was 98 this morning General Notes: Wound exam; right fifth toe has an open area at the tip of this. I used a #5 curette to remove the debris from the center there is still some depth. Our nurses have been defining an open area here every week ooThe large area on the posterior calf is contracted especially superiorly there is some hyper granulation inferiorly but I did not think this needed mechanical debridement ooThere is no evidence of infection of either wound area the toe itself is a lot less swollen Integumentary (Hair, Skin) Wound #3 status is Open. Original cause of wound was Trauma. The wound is located on  the Right,Posterior Lower Leg. The wound measures 13.5cm length x 4.2cm width x 0.1cm depth; 44.532cm^2 area and 4.453cm^3 volume. There is Fat Layer (Subcutaneous Tissue) Exposed exposed. There is no tunneling or undermining noted. There is a medium amount of serosanguineous drainage noted. The wound margin is distinct with the outline attached to the wound base. There is large (67-100%) pink,  hyper - granulation within the wound bed. There is a small (1-33%) amount of necrotic tissue within the wound bed including Adherent Slough. Wound #5 status is Open. Original cause of wound was Blister. The wound is located on the Right Toe Fifth. The wound measures 0.4cm length x 0.5cm width x 0.1cm depth; 0.157cm^2 area and 0.016cm^3 volume. There is Fat Layer (Subcutaneous Tissue) Exposed exposed. There is no tunneling or undermining noted. There is a small amount of serous drainage noted. The wound margin is distinct with the outline attached to the wound base. There is medium (34-66%) pink granulation within the wound bed. There is a medium (34-66%) amount of necrotic tissue within the wound bed including Adherent Slough. Wound #9 status is Healed - Epithelialized. Original cause of wound was Gradually Appeared. The wound is located on the Right,Anterior Lower Leg. The wound measures 0cm length x 0cm width x 0cm depth; 0cm^2 area and 0cm^3 volume. There is no tunneling or undermining noted. There is a none present amount of drainage noted. The wound margin is distinct with the outline attached to the wound base. There is no granulation within the wound bed. There is no necrotic tissue within the wound bed. 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 There was a Excisional Skin/Subcutaneous  Tissue Debridement with a total area of 0.2 sq cm performed by Ricard Dillon., MD. With the following instrument(s): Curette to remove Viable and Non-Viable tissue/material. Material removed includes Subcutaneous Tissue and Slough and after achieving pain control using Other (benzocaine 20% spray). No specimens were taken. A time out was conducted at 11:00, prior to the start of the procedure. A Minimum amount of bleeding was controlled with Pressure. The procedure was tolerated well with a pain level of 7 throughout and a pain level of 4 following the procedure. Post Debridement Measurements: 0.4cm length x 0.5cm width x 0.2cm depth; 0.031cm^3 volume. Character of Wound/Ulcer Post Debridement is improved. Severity of Tissue Post Debridement is: Fat layer exposed. Post procedure Diagnosis Wound #5: Same as Pre-Procedure Wound #3 Pre-procedure diagnosis of Wound #3 is a Venous Leg Ulcer located on the Right,Posterior Lower Leg . There was a Haematologist Compression Therapy Procedure by [name], RN. Post procedure Diagnosis Wound #3: Same as Pre-Procedure Plan Follow-up Appointments: Return Appointment in 1 week. Dressing Change Frequency: Wound #3 Right,Posterior Lower Leg: Other: - twice a week - wound clinic to change 1x a week on Monday, home health to change 1x a week on Thursday or Friday Wound #5 Right Toe Fifth: Other: - twice a week - wound clinic to change 1x  a week on Monday, home health to change 1x a week on Thursday or Friday Skin Barriers/Peri-Wound Care: Wound #3 Right,Posterior Lower Leg: Barrier cream Moisturizing lotion - to leg Wound Cleansing: Clean wound with Normal Saline. - or normal saline on days that dressing is changed May shower with protection. Primary Wound Dressing: Wound #3 Right,Posterior Lower Leg: Hydrofera Blue - Classic Wound #5 Right Toe Fifth: Hydrofera Blue - Classic Secondary Dressing: Wound #3 Right,Posterior Lower Leg: Foam - foam  top bend of foot for protection ABD pad Zetuvit or Kerramax - or Xtrasorb Wound #5 Right Toe Fifth: Kerlix/Rolled Gauze Dry Gauze Other: - felt or rolled up gauze under toe to elevate tip of toe off of shoe Edema Control: Unna Boot to Right Lower Extremity - *****ONLY ONE SINGLE LAYER OF UNNA BOOT***** - foam to achilles to pad, unna, kerlix, coban Avoid standing for long periods of time Elevate legs to the level of the heart or above for 30 minutes daily and/or when sitting, a frequency of: - throughout the day Exercise regularly Additional Orders / Instructions: Other: - Go to ER if toe/leg gets more red/black, increased pain, fever Home Health: [facility] for wound care. - [facility]. We are using Hydrofera Blue to the right fifth toe 2. Continue Hydrofera Blue wound to the large wound on the right posterior calf Electronic Signature(s) Signed: 12/17/2019 5:31:36 PM By: [name] MD Entered By: [name] on 12/17/2019 11:29:58 -------------------------------------------------------------------------------- SuperBill Details Patient Name: Date of Service: [name], [name] 12/17/2019 Medical Record [id number] Patient Account Number: [id number] Date of Birth/Sex: Treating RN: Feb 24, 1946 (73 y.o. [name] Primary Care Provider: [name] Other Clinician: Referring Provider: Treating Provider/Extender:Claudine Stallings, [name], [name] in Treatment: 19 Diagnosis Coding ICD-10 Codes Code Description S80.11XD Contusion of right lower leg, subsequent encounter L97.212 Non-pressure chronic ulcer of right calf with fat layer exposed I87.321 Chronic venous hypertension (idiopathic) with inflammation of right lower extremity E11.622 Type 2 diabetes mellitus with other skin ulcer L97.518 Non-pressure chronic ulcer of other part of right foot with other specified severity L97.811 Non-pressure chronic ulcer of  other part of right lower leg limited to breakdown of skin Facility Procedures CPT4 Code Description: 59563875 11042 - DEB SUBQ TISSUE 20 SQ CM/< ICD-10 Diagnosis Description L97.518 Non-pressure chronic ulcer of other part of right foot with o Modifier: ther specifie Quantity: 1 d severity CPT4 Code Description: [id number] (Facility Use Only) [id number] - APPLY UNNA BOOT RT Modifier: 81 Quantity: 1 Physician Procedures CPT4 Code Description: 6301601 11042 - WC PHYS SUBQ TISS 20 SQ CM ICD-10 Diagnosis Description L97.518 Non-pressure chronic ulcer of other part of right foot with Modifier: other specified se Quantity: 1 verity Electronic Signature(s) Signed: 12/17/2019 5:31:36 PM By: [name] MD Entered By: [name] on 12/17/2019 11:30:25

## 2019-12-24 ENCOUNTER — Encounter (HOSPITAL_BASED_OUTPATIENT_CLINIC_OR_DEPARTMENT_OTHER): Payer: Medicare Other | Attending: Internal Medicine | Admitting: Internal Medicine

## 2019-12-24 ENCOUNTER — Other Ambulatory Visit: Payer: Self-pay

## 2019-12-24 DIAGNOSIS — I5032 Chronic diastolic (congestive) heart failure: Secondary | ICD-10-CM | POA: Insufficient documentation

## 2019-12-24 DIAGNOSIS — I2510 Atherosclerotic heart disease of native coronary artery without angina pectoris: Secondary | ICD-10-CM | POA: Insufficient documentation

## 2019-12-24 DIAGNOSIS — E1140 Type 2 diabetes mellitus with diabetic neuropathy, unspecified: Secondary | ICD-10-CM | POA: Insufficient documentation

## 2019-12-24 DIAGNOSIS — X58XXXA Exposure to other specified factors, initial encounter: Secondary | ICD-10-CM | POA: Diagnosis not present

## 2019-12-24 DIAGNOSIS — S8011XD Contusion of right lower leg, subsequent encounter: Secondary | ICD-10-CM | POA: Diagnosis not present

## 2019-12-24 DIAGNOSIS — L97212 Non-pressure chronic ulcer of right calf with fat layer exposed: Secondary | ICD-10-CM | POA: Insufficient documentation

## 2019-12-24 DIAGNOSIS — E1151 Type 2 diabetes mellitus with diabetic peripheral angiopathy without gangrene: Secondary | ICD-10-CM | POA: Diagnosis not present

## 2019-12-24 DIAGNOSIS — L97811 Non-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: Secondary | ICD-10-CM | POA: Insufficient documentation

## 2019-12-24 DIAGNOSIS — E11622 Type 2 diabetes mellitus with other skin ulcer: Secondary | ICD-10-CM | POA: Diagnosis not present

## 2019-12-24 DIAGNOSIS — E11621 Type 2 diabetes mellitus with foot ulcer: Secondary | ICD-10-CM | POA: Diagnosis not present

## 2019-12-24 DIAGNOSIS — I4891 Unspecified atrial fibrillation: Secondary | ICD-10-CM | POA: Diagnosis not present

## 2019-12-24 DIAGNOSIS — Z940 Kidney transplant status: Secondary | ICD-10-CM | POA: Diagnosis not present

## 2019-12-24 DIAGNOSIS — D631 Anemia in chronic kidney disease: Secondary | ICD-10-CM | POA: Diagnosis not present

## 2019-12-24 DIAGNOSIS — I130 Hypertensive heart and chronic kidney disease with heart failure and stage 1 through stage 4 chronic kidney disease, or unspecified chronic kidney disease: Secondary | ICD-10-CM | POA: Diagnosis not present

## 2019-12-24 DIAGNOSIS — M109 Gout, unspecified: Secondary | ICD-10-CM | POA: Insufficient documentation

## 2019-12-24 DIAGNOSIS — L97512 Non-pressure chronic ulcer of other part of right foot with fat layer exposed: Secondary | ICD-10-CM | POA: Insufficient documentation

## 2019-12-24 DIAGNOSIS — E1122 Type 2 diabetes mellitus with diabetic chronic kidney disease: Secondary | ICD-10-CM | POA: Diagnosis not present

## 2019-12-24 DIAGNOSIS — N189 Chronic kidney disease, unspecified: Secondary | ICD-10-CM | POA: Insufficient documentation

## 2019-12-24 NOTE — Progress Notes (Signed)
TRAVELLE, MCCLIMANS ([id number]) Visit Report for 12/24/2019 Debridement Details Patient Name: Date of Service: [name], [name] 12/24/2019 10:15 AM Medical Record [id number] Patient Account Number: [id number] Date of Birth/Sex: [birth year] (74 y.o. M) Treating RN: [name] Primary Care Provider: [name] Other Clinician: Referring Provider: Treating Provider/Extender:Patrice Moates, [name], [name] in Treatment: 20 Debridement Performed for Wound #5 Right Toe Fifth Assessment: Performed By: Physician Ricard Dillon., MD Debridement Type: Debridement Severity of Tissue Pre Fat layer exposed Debridement: Level of Consciousness (Pre- Awake and Alert procedure): Pre-procedure Verification/Time Out Taken: Yes - 10:39 Start Time: 10:39 Pain Control: Other : Benzocaine 20% Total Area Debrided (L x W): 0.4 (cm) x 0.3 (cm) = 0.12 (cm) Tissue and other material Viable, Non-Viable, Subcutaneous debrided: Level: Skin/Subcutaneous Tissue Debridement Description: Excisional Instrument: Curette Bleeding: Minimum Hemostasis Achieved: Pressure End Time: 10:40 Procedural Pain: 0 Post Procedural Pain: 0 Response to Treatment: Procedure was tolerated well Level of Consciousness Awake and Alert (Post-procedure): Post Debridement Measurements of Total Wound Length: (cm) 0.4 Width: (cm) 0.3 Depth: (cm) 0.1 Volume: (cm) 0.009 Character of Wound/Ulcer Post Improved Debridement: Severity of Tissue Post Debridement: Fat layer exposed Post Procedure Diagnosis Same as Pre-procedure Electronic Signature(s) Signed: 12/24/2019 5:09:30 PM By: [name] RN, BSN Signed: 12/24/2019 5:24:04 PM By: [name] MD Entered By: [name] on 12/24/2019 10:53:20 -------------------------------------------------------------------------------- HPI Details Patient Name: Date of Service: [name], [name] 12/24/2019 10:15 AM Medical Record [id number]  Patient Account Number: [id number] Date of Birth/Sex: Treating RN: Apr 04, 1946 (73 y.o. [name] Primary Care Provider: [name] Other Clinician: Referring Provider: Treating Provider/Extender:Dequan Kindred, [name], [name] in Treatment: 20 History of Present Illness HPI Description: [name] HPI Description: 74 year old gentleman who was recently seen by his nephrologist [name], and noted to have a wound on his left lower extremity which was lacerated 2 months ago and now has reopened. The patient's left shin has a ulceration with some exudate but no evidence of infection and he was referred to [place] for further care as it was known that the patient has had some peripheral vascular disease in the past. Past medical history significant for chronic kidney disease, atrial fibrillation, diabetes mellitus,status post kidney transplant in 1983 and 2005, a week fistula graft placement, status post previous bowel surgery. 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 10/06/2015 -- x-ray of the left tibia and fibula shows no evidence of osteomyelitis. The patient has also had Doppler studies of his extremity and is awaiting the appointment with the vascular surgeon. We have not yet received these reports. 10/13/2015 -- lower extremity venous duplex reflux evaluation shows reflux in the left common femoral vein, left saphenofemoral junction and the proximal greater saphenous vein extending to the proximal calf. There is also reflux in the left proximal to mid small saphenous vein. Arterial duplex studies done showed the resting ABI was not applicable due to tibial artery medial calcification. 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 The right TBI was 1 which is normal and the left ABI was 1 which is  normal. Patient has otherwise been doing fine and has been compliant with his dressings. 10/20/2015 -- He was seen by [name] recently for a vascular opinion on 10/15/2015. His left lower extremity venous insufficiency duplex study revealed GSV reflux,SS vein reflux and deep venous  reflux in the common femoral vein. His ABIs were non compressible and his TBI on the right was 1.01 and on the left was 0.80. He was asked to continue with the wound care with compressive therapy followed by EVLA of the left GS vein 3 months. He recommended 20-30 mm thigh-high compression stockings and the need for a three-month trial of this. The patient had an Unna boot applied at the vascular office but he could not tolerate this with a lot of pain and issues with his toes and hence came here on Friday for removal of this and we reapplied a 2 layer compression. 11/10/2015 -- patient still has not purchased his 20-30 mm thigh-high compression stockings as prescribed by [name]. Readmission: 08/08/18 on evaluation today patient presents for readmission concerning a new injury to the left anterior lower extremity. He was previously seen in 2017 here in our clinic. He states that he has done fairly well since that point. Nonetheless he is having at this time some pain but states that he hit this on a table that fell over and actually struck his leg. This appears to have pulled back some of his skin which folded in on itself and is causing some difficulty as far as that is concerned. There does not appear to be any evidence of infection at this time. No fevers, chills, nausea, or vomiting noted at this time. He's been using dressings on his own currently without complication. 08/15/18 on evaluation today patient actually appears to be doing somewhat better in regard to his wound of the lower [name] when compared to the first visit last week. I had to do a much more extensive debridement at that time it does  appear that I'm gonna have to perform some debridement today but it does not look to be as extensive by any means. Nonetheless fortunately he does not show any signs of infection he does have discomfort at this site. I believe based on what I'm seeing currently he may benefit from Iodoflex to help keep the wound bed clean. Patient tolerated therapy without complication. Upon evaluation today the patient actually appears to be doing excellent in regard to his left lower extremity ulcer. This is much better than the previous two visits where he had a lot of necrotic tissue around the edge of the wound simply due to the fact that again there was a significant skin tear where the edge had been cleared away prior to reattaching and being able to heal appropriately. He seems to be doing much better at this point. 08/28/18 on evaluation today the patient's wound actually does appear to be showing signs of improvement. With that being said though he is improving he would likely note even greater improvement if we were able to sharply debride the wound. Nonetheless this caused him to much discomfort he tells me. 09/04/18 on evaluation today patient actually appears to be showing signs of improvement in regard to his left lower extremity ulcer. He has been tolerating the dressing changes including the wrap although he tells me at this point that the burning does last for a couple of days even with just the Iodoflex. I was afraid that this may been part of the issue that he was having with discomfort. It does seem to be the case. Nonetheless he shows no signs of evidence of infection at this time which is good news. No fevers chills noted AD[facility]ION [name] 10[facility]05/2018 This is a patient who was  cared for in 2017 and again in the fall of this year at our sister clinic and [facility]. He actually lives in [place] in [place]. We have been dealing with an apparent traumatic area on the left  anterior tibial area. This is been present for the last several months. He was supposed to be using Iodoflex Kerlix and Coban however he was hospitalized from 09/05/2018 through 09/11/2018 with delirium secondary to pneumonia. Since then he is only been putting Vaseline gauze on this without compression. He also has a more recent skin tear on the dorsal right hand that may have only happened in the last week. The patient had arterial studies done in 2017 in January which was 3 years ago. At that point he had noncompressible ABIs but really quite good TBI's both normal. Triphasic waveforms on the right monophasic at the left posterior tibial but triphasic at the left dorsalis pedis. His ABIs in our clinic today were both noncompressible 1/23; the patient has wounds on the right dorsal hand just distal to the wrist and on the left anterior lower extremity. Both of these look very healthy he is using Hydrofera Blue 1/30; left anterior lower extremity wound much smaller. Healthy looking surface. The laceration area just distal to the wrist on the dorsal hand on the right is also just about closed I used Hydrofera Blue here 2/6; left anterior lower extremity wound is much smaller but still open. The laceration area just distal to the wrist on the dorsal hand is fully epithelialized. 2/13; the patient's anterior lower extremity wound is closed. The laceration just distal to the wrist on the dorsal hand is also fully epithelialized and closed. The patient has external compression stockings which I think are 20/30 READMISSION 08/06/2019 Mr. Kerstein is a 74 year old man with had several times previously in our clinic. He is a diabetic with a history of chronic renal insufficiency status post kidney transplant in 1983 and again in 2005. He was then in 2017 with a laceration on the left lower extremity. He was worked up at the time with arterial studies and reflux studies. The arterial studies showed ABIs  to be noncompressible but TBI's were within normal limits. I do not have the reflux studies at the moment. He was also sent here in 2019 with a left lower extremity wound and then again in 2020 with left lower extremity trauma a skin tear on the wrist. He was discharged with 20/30 stockings identified from myself that that might not be enough compression. Nevertheless he states he was wearing these fairly reliably. In September he had a fall with a substantial bruise in the area of the wound. He says he saw orthopedics and they told him there was some muscle strain sometime it later this opened into a wound. He has a fairly substantial wound on the right posterior calf. Satellite areas around this including medially and posteriorly. He has not worn his stockings since the injury Past medical history; includes chronic renal failure secondary to diabetes with kidney transplant x2, atrial fibrillation, heart failure with preserved ejection fraction, coronary artery disease. ABIs on the right in our clinic were once again noncompressible 08/13/2019 on evaluation today patient appears to be doing decently well with regard to his wound compared to last week's evaluation. Unfortunately he is still having a lot of discomfort at this point which is I think in some part due to the 3 layer compression wrap which is a little bit stronger I think for him. When he  was here before we actually utilized a Kerlix and Coban wrap which he states seemed to got a little bit better. Nonetheless I think we can probably drop back to this in light of the discomfort that he had. Nonetheless the pain was not really right around the wound itself as much as it was around the ankle in particular. The Iodoflex does seem to have done well for him As the wound is appearing somewhat better today which is excellent news. 11/30; still complaining of a lot of pain. Apparently arterial studies I ordered 2 weeks ago are below. I do not  believe we have an appointment with vein and vascular as of yet; ABI Findings: +---------+------------------+-----+----------+--------+ Right Rt Pressure (mmHg)IndexWaveform Comment  +---------+------------------+-----+----------+--------+ PTA >254 1.50 monophasic  +---------+------------------+-----+----------+--------+ DP >254 1.50 monophasic  +---------+------------------+-----+----------+--------+ Great Toe68 0.40 Abnormal   +---------+------------------+-----+----------+--------+ +---------+------------------+-----+----------+-------+ Left Lt Pressure (mmHg)IndexWaveform Comment +---------+------------------+-----+----------+-------+ Brachial 169     +---------+------------------+-----+----------+-------+ PTA >254 1.50 monophasic  +---------+------------------+-----+----------+-------+ DP >254 1.50 monophasic  +---------+------------------+-----+----------+-------+ Great Toe40 0.24 Abnormal   +---------+------------------+-----+----------+-------+ Pedal arteries appear hyperemic. Patient refused Brachial pressure in the right arm. Summary: Right: Resting right ankle-brachial index indicates noncompressible right lower extremity arteries. The right toe- brachial index is abnormal. Left: Resting left ankle-brachial index indicates noncompressible left lower extremity arteries. The left toe-brachial index is abnormal. He is also having considerably more swelling in his left calf. This was not there when I last saw him 2 weeks ago. He tells me that some of the home health compression wraps have been slipping down and that may be the issue here however a month I am uncertain 77/8; sees vascular on December 22. Still complaining of a lot of pain. DVT study I did last time was negative for DVT 12/14; still complaining of pain which if this is arterial is certainly claudication and rest keeps him uncomfortable at night. He has an  appointment with vein and vascular on December 22. Wound surface is better using Iodoflex. Once the surface of this is satisfactory and we have exhausted the vascular route. Perhaps an advanced treatment option. He has a configuration of the venous ulceration although his arterial studies are not very good. The other issue is the patient has a transplanted kidney. This will make angiography difficult and challenging issue 12/21; still complaining of pain and drainage. We are using Iodoflex on the wound under compression. He sees Dr. Donnetta Hutching tomorrow to evaluate his noninvasive studies noted above. He has a transplanted kidney further complicating the options for angiography. 12/28; still using Iodoflex under compression. I have Dr. Luther Parody note from 12/22. he noted arterial studies revealing monophasic waveforms at the pedal vessels bilaterally and calcified vessels making the ABI unreliable. He did not comment on the reduced TBI's. He felt these were venous wounds based on the palpable dorsalis pedis pulse. 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 And they arranged for formal venous duplex with reflux studies in the next several weeks. Follow-up with either Scot Dock or Dr. Oneida Alar 09/24/2019 still using Iodoflex under compression. He has an appointment with Dr. Doren Custard on 1/7 with regards to his venous disease. The patient was not felt to have a primary arterial problem for the nonhealing of his wound. We did attempt to wrap him with 3 layer when he first came into the clinic he complained of a lot of pain in the ankle although this may have been because the dressing fell down somewhat. He has far too edema fluid in the right leg for a  good prognosis about healing this wound He comes in today with an excoriation on the bottom part of his right fifth toe. He thinks he may have done this taking off his clothes and that something got caught on the toe. There is no open wound however the toe itself  is very painful 1/11; we are using Iodoflex under compression. The wound bed is clean. He went on to see Dr. Doren Custard on 09/27/2019 he again feels that the patient's arterial supply is adequate. He feels that he might benefit from right greater saphenous vein ablation for a venous ulcer. In the meantime the area that he was complaining about last week on the right fifth toe. An x-ray that I ordered showed marked soft tissue swelling along the distal aspect of the right foot but there was no evidence of osteomyelitis. He comes in today with the fifth toenail just about coming off. He has black eschar underneath the toe on the plantar aspect. The toe was swollen red and very painful. In the right setting this could be a significant soft tissue infection versus ischemia of the toe itself. He did not show this to Dr. Doren Custard 1/18; I am using Iodoflex under compression a large wound on the right posterior calf. He had his right greater saphenous vein ablation by Dr. Doren Custard although I am not sure that is the only problem here. The right fifth toe which was possibly trauma 2 weeks ago continues to be exceptionally painful with a necrotic tip. Maybe not quite as swollen. I started him empirically on Augmentin 5/125 1 p.o. twice daily last week after discussing this with Dr. Loletha Grayer of nephrology. Perhaps somewhat better this week but not as good as I was hoping. A plain x-ray was negative. He comes in today with an area on the medial right calf that was blistered and now open. In looking at things he appears to be systemically fluid volume overloaded. He has a transplanted kidney. He states he takes his Lasix variably when he has appointments he tries not to take it the later I am really not certain if he takes this reliably. However he has far too much edema fluid in the right leg to easily heal this wound and he appears to be developing blisters medially to form additional wounds 1/25; his CO2 angiogram was done by  Dr. Doren Custard last week. This showed the proximal arteries all to be patent. On the right side the common femoral deep femoral superficial femoral popliteal anterior tibial and peroneal arteries were patent. The posterior tibial was occluded but reconstituted distally via collaterals from the peroneal artery he was felt he should have enough blood flow to heal his wounds including the right fifth toe. The right fifth toe looks better however he is still complaining of a lot of pain. The large area which is a venous ulceration posteriorly has a better looking surface I think we can switch to Hydrofera Blue today 2/1; the patient's original wounds on the right posterior medial calf has come down in width however superior to this he has new denuded areas and I am concerned we simply do not have enough edema control. He has already undergone a right greater saphenous ablation. He had a CO2 angiogram done by Dr. Doren Custard and the comment was that we should have enough blood flow to heal the venous wound however we simply do not seem to have enough edema control with 3 layer compression. The patient is status post kidney transplant although his  exact renal function is not really clear. Nor am i sure what dose of diuretic he is supposed to be on. He also has the area on the right fifth toe which was unclear etiology but I think became secondarily infected I gave him 2 weeks of antibiotics for this and this seems to have settled down he still has a black eschar over the tip of the toe. X-ray was negative for fracture. He says he has a history of gout. 2/8; the patient's wound on the right posterior calf is about the same. The superficial area medially also about the same. He got a prescription for prednisoneo Gout after he developed erythema on the dorsal aspect of his left great toe going along with the right fifth toe which has been problematic all along. He has not taken it because he is concerned about  increasing CBGs. He has a transplanted kidney is already on prednisone 5 mg. He would not be a good candidate for NSAIDs. Perhaps colchicine. He is not aware of what his uric acid level is 2/15; his right posterior calf wound seems to be coming in in terms of width. Everything here looks fairly good. No mechanical debridement we have been using Hydrofera Blue. He has had an ablation by vein and vascular. Felt to have adequate arterial supply to this area. The patient got prednisone last week from Dr. Angelique Holm of nephrology. At my urging he actually took it. The area on his left toe was a lot better. The right toe was not as painful but still erythematous with a wound at the tip. We have been using silver alginate here 2/22; right posterior calf seems to be gradually epithelializing. Still a fairly substantial wound. He still has an area on the tip of his right fifth toe which I think was gout related. This is gradually improving. We have been using Unna boots to wrap X-ray I did of the foot last time was again negative there was soft tissue irregularity about the distal fifth digit but no radiographic evidence of bone damage 3/1; right posterior calf seems to be gradually improving however there were areas of hyper granulation. We have been using Hydrofera Blue. The hyper granulation is mostly evident in the most superior finger shape projection of the wound itself. The area on the right fifth toe also was slough covered and required debridement. 3/8 continued improvement in the right posterior calf and the tip of the right fifth toe. We have been using Hydrofera Blue under compression he is changing the area to the toe 3/22; continued improvement in the right posterior calf. Unfortunately comes in today with a new skin tear in the mid anterior tibia area this probably had something to do with changing his dressing home health I called and left this a message last week. 3/29; continued  improvement in the a substantial wound on the right posterior calf. The skin tear anteriorly from last week is already healed on the tip of the right fifth toe there is still a nonviable area. 4/5; we have continued contraction of the substantial wound on the right posterior calf. We continue to have problems with the tip of the right fifth toe. We have been using Hydrofera Blue to both wound areas He is complaining about some discomfort in the Achilles area. Tells me he had surgery for what sounds like a torn Achilles about 10 years ago. Electronic Signature(s) Signed: 12/24/2019 5:24:04 PM By: [name] MD Entered By: [name] on 12/24/2019 10:55:38 --------------------------------------------------------------------------------  Physical Exam Details Patient Name: Date of Service: [name], [name] 12/24/2019 10:15 AM Medical Record [id number] Patient Account Number: [id number] Date of Birth/Sex: Treating RN: [birth year] (74 y.o. [name] Primary Care Provider: [name] Other Clinician: Referring Provider: Treating Provider/Extender:Jara Feider, [name], [name] in Treatment: 40 Constitutional Patient is hypertensive.. Pulse regular and within target range for patient.Marland Kitchen Respirations regular, non-labored and within target range.. Temperature is normal and within the target range for the patient.Marland Kitchen Appears in no distress. Musculoskeletal I did not see anything obviously wrong with his Achilles.. Integumentary (Hair, Skin) No evidence of infection in any site.. Notes Wound exam; Right fifth toe is right at the tip of the toe. I used a #3 curette again to remove necrotic debris from the surface this has quite a significant amount of depth although it does not probe to bone. Necrotic tissue removed. The large area in the posterior calf which was the venous wound that he came in to see [place] with continues to contract nicely. Nice rims of  epithelialization Electronic Signature(s) Signed: 12/24/2019 5:24:04 PM By: [name] MD Entered By: [name] on 12/24/2019 10:57:44 -------------------------------------------------------------------------------- Physician Orders Details Patient Name: Date of Service: KLAYTEN, JOLLIFF 12/24/2019 10:15 AM Medical Record [id number] Patient Account Number: [id number] Date of Birth/Sex: Treating RN: 1945/12/25 (74 y.o. 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 Dressing Change Frequency Wound #3 Right,Posterior Lower Leg Other: - twice a week - wound clinic to change 1x a week on Monday, home health to change 1x a week on Thursday or Friday Wound #5 Right Toe Fifth Other: - twice a week - wound clinic to change 1x a week on Monday, home health to change 1x a week on Thursday or Friday Skin Barriers/Peri-Wound Care Wound #3 Right,Posterior Lower Leg Barrier cream Moisturizing lotion - to leg Wound Cleansing Clean wound with Normal Saline. - or normal saline on days that dressing is changed May shower with protection. Primary Wound Dressing Wound #3 Right,Posterior Lower Leg Hydrofera Blue - Classic Wound #5 Right Toe Fifth Silver  Collagen - moisten with hydrogel or KY jelly Secondary Dressing Wound #3 Right,Posterior Lower Leg Foam - foam top bend of foot for protection ABD pad Zetuvit or Kerramax - or Xtrasorb Wound #5 Right Toe Fifth Kerlix/Rolled Gauze Dry Gauze Other: - felt or rolled up gauze under toe to elevate tip of toe off of shoe Edema Control Unna Boot to Right Lower Extremity - *****ONLY ONE SINGLE LAYER OF UNNA BOOT***** - foam to achilles to pad Avoid standing for long periods of time Elevate legs to the level of the heart or above for 30 minutes daily and/or when sitting, a frequency of: - throughout the day Exercise regularly Additional Orders / Instructions Other: - Go to ER if toe/leg gets more red/black, increased pain, fever [facility] for wound care. - [facility] Electronic Signature(s)  Signed: 12/24/2019 5:09:30 PM By: [name] RN, BSN Signed: 12/24/2019 5:24:04 PM By: [name] MD Entered By: [name] on 12/24/2019 10:42:20 -------------------------------------------------------------------------------- Problem List Details Patient Name: Date of Service: ULYS, FAVIA 12/24/2019 10:15 AM Medical Record [id number] Patient Account Number: [id number] Date of Birth/Sex: Treating RN: Apr 13, 1946 (73 y.o. [name] Primary Care Provider: [name] Other Clinician: Referring Provider: Treating Provider/Extender:Veasna Santibanez, [name], [name] in Treatment: 20 Active Problems ICD-10 Evaluated Encounter Code Description Active Date Today Diagnosis S80.11XD Contusion of right lower leg, subsequent encounter 08/06/2019 No Yes L97.212 Non-pressure chronic ulcer of right calf with fat layer 08/06/2019 No Yes exposed I87.321 Chronic venous hypertension (idiopathic) with 08/06/2019 No Yes inflammation of right lower extremity E11.622 Type 2 diabetes mellitus with other skin ulcer 08/06/2019 No  Yes L97.518 Non-pressure chronic ulcer of other part of right foot 10/01/2019 No Yes with other specified severity Inactive Problems ICD-10 Code Description Active Date Inactive Date L97.811 Non-pressure chronic ulcer of other part of right lower leg 12/10/2019 12/10/2019 limited to breakdown of skin Resolved Problems Electronic Signature(s) Signed: 12/24/2019 5:24:04 PM By: [name] MD Entered By: [name] on 12/24/2019 10:52:44 -------------------------------------------------------------------------------- Progress Note Details Patient Name: Date of Service: TAKEO, HARTS 12/24/2019 10:15 AM Medical Record [id number] Patient Account Number: [id number] Date of Birth/Sex: Treating RN: 1946/09/10 (74 y.o. [name] Primary Care Provider: [name] Other Clinician: Referring Provider: Treating Provider/Extender:Domnique Vanegas, [name], [name] in Treatment: 20 Subjective History of Present Illness (HPI) [place] HPI Description: 74 year old gentleman who was recently seen by his nephrologist [name], and noted to have a wound on his left lower extremity which was lacerated 2 months ago and now has reopened. The patient's left shin has a ulceration with some exudate but no evidence of infection and he was referred to [place] for further care as it was known that the patient has had some peripheral vascular disease in the past. Past medical history significant for chronic kidney disease, atrial fibrillation, diabetes mellitus,status post kidney transplant in 1983 and 2005, a week fistula graft placement, status post previous bowel surgery. 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 10/06/2015 -- x-ray of the left tibia and fibula shows no evidence of osteomyelitis. The patient has also had Doppler studies of his extremity and is awaiting the appointment with the vascular surgeon. We have not yet  received these reports. 10/13/2015 -- lower extremity venous duplex reflux evaluation shows reflux in the left common femoral vein, left saphenofemoral junction and the proximal greater saphenous vein extending to the proximal calf. There is also reflux in the left proximal to mid small saphenous vein. Arterial duplex studies done showed the resting ABI was not applicable due to tibial artery medial calcification. 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 The right TBI was 1 which is normal and the left ABI was 1 which is normal. Patient has otherwise been doing fine and has been compliant with his dressings. 10/20/2015 -- He was seen by [name] recently for a vascular opinion on 10/15/2015. His left lower extremity venous insufficiency duplex study revealed GSV reflux,SS vein reflux and deep venous reflux in the common femoral vein. His ABIs were non compressible and his TBI on the right was 1.01 and on the left was 0.80. He was asked to  continue with the wound care with compressive therapy followed by EVLA of the left GS vein 3 months. He recommended 20-30 mm thigh-high compression stockings and the need for a three-month trial of this. The patient had an Unna boot applied at the vascular office but he could not tolerate this with a lot of pain and issues with his toes and hence came here on Friday for removal of this and we reapplied a 2 layer compression. 11/10/2015 -- patient still has not purchased his 20-30 mm thigh-high compression stockings as prescribed by [name]. Readmission: 08/08/18 on evaluation today patient presents for readmission concerning a new injury to the left anterior lower extremity. He was previously seen in 2017 here in our clinic. He states that he has done fairly well since that point. Nonetheless he is having at this time some pain but  states that he hit this on a table that fell over and actually struck his leg. This appears to have pulled back some of his skin which folded in on itself and is causing some difficulty as far as that is concerned. There does not appear to be any evidence of infection at this time. No fevers, chills, nausea, or vomiting noted at this time. He's been using dressings on his own currently without complication. 08/15/18 on evaluation today patient actually appears to be doing somewhat better in regard to his wound of the lower [name] when compared to the first visit last week. I had to do a much more extensive debridement at that time it does appear that I'm gonna have to perform some debridement today but it does not look to be as extensive by any means. Nonetheless fortunately he does not show any signs of infection he does have discomfort at this site. I believe based on what I'm seeing currently he may benefit from Iodoflex to help keep the wound bed clean. Patient tolerated therapy without complication. Upon evaluation today the patient actually appears to be doing excellent in regard to his left lower extremity ulcer. This is much better than the previous two visits where he had a lot of necrotic tissue around the edge of the wound simply due to the fact that again there was a significant skin tear where the edge had been cleared away prior to reattaching and being able to heal appropriately. He seems to be doing much better at this point. 08/28/18 on evaluation today the patient's wound actually does appear to be showing signs of improvement. With that being said though he is improving he would likely note even greater improvement if we were able to sharply debride the wound. Nonetheless this caused him to much discomfort he tells me. 09/04/18 on evaluation today patient actually appears to be showing signs of improvement in regard to his left lower extremity ulcer. He has been tolerating the  dressing changes including the wrap although he tells me at this point that the burning does last for a couple of days even with just the Iodoflex. I was afraid that this may been part of the issue that he was having with discomfort. It does seem to be the case. Nonetheless he shows no signs of evidence of infection at this time which is good news. No fevers chills noted ADMISSION to [name] 10/05/2018 This is a patient who was cared for in 2017 and again in the fall of this year at our sister clinic and [place]. He actually lives in [place] in [place]. We have been  dealing with an apparent traumatic area on the left anterior tibial area. This is been present for the last several months. He was supposed to be using Iodoflex Kerlix and Coban however he was hospitalized from 09/05/2018 through 09/11/2018 with delirium secondary to pneumonia. Since then he is only been putting Vaseline gauze on this without compression. He also has a more recent skin tear on the dorsal right hand that may have only happened in the last week. The patient had arterial studies done in 2017 in January which was 3 years ago. At that point he had noncompressible ABIs but really quite good TBI's both normal. Triphasic waveforms on the right monophasic at the left posterior tibial but triphasic at the left dorsalis pedis. His ABIs in our clinic today were both noncompressible 1/23; the patient has wounds on the right dorsal hand just distal to the wrist and on the left anterior lower extremity. Both of these look very healthy he is using Hydrofera Blue 1/30; left anterior lower extremity wound much smaller. Healthy looking surface. The laceration area just distal to the wrist on the dorsal hand on the right is also just about closed I used Hydrofera Blue here 2/6; left anterior lower extremity wound is much smaller but still open. The laceration area just distal to the wrist on the dorsal hand is  fully epithelialized. 2/13; the patient's anterior lower extremity wound is closed. The laceration just distal to the wrist on the dorsal hand is also fully epithelialized and closed. The patient has external compression stockings which I think are 20/30 READMISSION 08/06/2019 Mr. Jeff is a 74 year old man with had several times previously in our clinic. He is a diabetic with a history of chronic renal insufficiency status post kidney transplant in 1983 and again in 2005. He was then in 2017 with a laceration on the left lower extremity. He was worked up at the time with arterial studies and reflux studies. The arterial studies showed ABIs to be noncompressible but TBI's were within normal limits. I do not have the reflux studies at the moment. He was also sent here in 2019 with a left lower extremity wound and then again in 2020 with left lower extremity trauma a skin tear on the wrist. He was discharged with 20/30 stockings identified from myself that that might not be enough compression. Nevertheless he states he was wearing these fairly reliably. In September he had a fall with a substantial bruise in the area of the wound. He says he saw orthopedics and they told him there was some muscle strain sometime it later this opened into a wound. He has a fairly substantial wound on the right posterior calf. Satellite areas around this including medially and posteriorly. He has not worn his stockings since the injury Past medical history; includes chronic renal failure secondary to diabetes with kidney transplant x2, atrial fibrillation, heart failure with preserved ejection fraction, coronary artery disease. ABIs on the right in our clinic were once again noncompressible 08/13/2019 on evaluation today patient appears to be doing decently well with regard to his wound compared to last week's evaluation. Unfortunately he is still having a lot of discomfort at this point which is I think in some  part due to the 3 layer compression wrap which is a little bit stronger I think for him. When he was here before we actually utilized a Kerlix and Coban wrap which he states seemed to got a little bit better. Nonetheless I think we can probably drop  back to this in light of the discomfort that he had. Nonetheless the pain was not really right around the wound itself as much as it was around the ankle in particular. The Iodoflex does seem to have done well for him As the wound is appearing somewhat better today which is excellent news. 11/30; still complaining of a lot of pain. Apparently arterial studies I ordered 2 weeks ago are below. I do not believe we have an appointment with vein and vascular as of yet; ABI Findings: +---------+------------------+-----+----------+--------+ Right Rt Pressure (mmHg)IndexWaveform Comment  +---------+------------------+-----+----------+--------+ PTA >254 1.50 monophasic  +---------+------------------+-----+----------+--------+ DP >254 1.50 monophasic  +---------+------------------+-----+----------+--------+ Great Toe68 0.40 Abnormal   +---------+------------------+-----+----------+--------+ +---------+------------------+-----+----------+-------+ Left Lt Pressure (mmHg)IndexWaveform Comment +---------+------------------+-----+----------+-------+ Brachial 169     +---------+------------------+-----+----------+-------+ PTA >254 1.50 monophasic  +---------+------------------+-----+----------+-------+ DP >254 1.50 monophasic  +---------+------------------+-----+----------+-------+ Great Toe40 0.24 Abnormal   +---------+------------------+-----+----------+-------+ Pedal arteries appear hyperemic. Patient refused Brachial pressure in the right arm. Summary: Right: Resting right ankle-brachial index indicates noncompressible right lower extremity arteries. The right toe- brachial index is  abnormal. Left: Resting left ankle-brachial index indicates noncompressible left lower extremity arteries. The left toe-brachial index is abnormal. He is also having considerably more swelling in his left calf. This was not there when I last saw him 2 weeks ago. He tells me that some of the home health compression wraps have been slipping down and that may be the issue here however a month I am uncertain 51/7; sees vascular on December 22. Still complaining of a lot of pain. DVT study I did last time was negative for DVT 12/14; still complaining of pain which if this is arterial is certainly claudication and rest keeps him uncomfortable at night. He has an appointment with vein and vascular on December 22. Wound surface is better using Iodoflex. Once the surface of this is satisfactory and we have exhausted the vascular route. Perhaps an advanced treatment option. He has a configuration of the venous ulceration although his arterial studies are not very good. The other issue is the patient has a transplanted kidney. This will make angiography difficult and challenging issue 12/21; still complaining of pain and drainage. We are using Iodoflex on the wound under compression. He sees Dr. Donnetta Hutching tomorrow to evaluate his noninvasive studies noted above. He has a transplanted kidney further complicating the options for angiography. 12/28; still using Iodoflex under compression. I have Dr. Luther Parody note from 12/22. he noted arterial studies revealing monophasic waveforms at the pedal vessels bilaterally and calcified vessels making the ABI unreliable. He did not comment on the reduced TBI's. He felt these were venous wounds based on the palpable dorsalis pedis pulse. 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 And they arranged for formal venous duplex with reflux studies in the next several weeks. Follow-up with either Scot Dock or Dr. Oneida Alar 09/24/2019 still using Iodoflex under compression. He has an  appointment with Dr. Doren Custard on 1/7 with regards to his venous disease. The patient was not felt to have a primary arterial problem for the nonhealing of his wound. We did attempt to wrap him with 3 layer when he first came into the clinic he complained of a lot of pain in the ankle although this may have been because the dressing fell down somewhat. He has far too edema fluid in the right leg for a good prognosis about healing this wound He comes in today with an excoriation on the bottom part of his right fifth toe. He thinks he may have done  this taking off his clothes and that something got caught on the toe. There is no open wound however the toe itself is very painful 1/11; we are using Iodoflex under compression. The wound bed is clean. He went on to see Dr. Doren Custard on 09/27/2019 he again feels that the patient's arterial supply is adequate. He feels that he might benefit from right greater saphenous vein ablation for a venous ulcer. In the meantime the area that he was complaining about last week on the right fifth toe. An x-ray that I ordered showed marked soft tissue swelling along the distal aspect of the right foot but there was no evidence of osteomyelitis. He comes in today with the fifth toenail just about coming off. He has black eschar underneath the toe on the plantar aspect. The toe was swollen red and very painful. In the right setting this could be a significant soft tissue infection versus ischemia of the toe itself. He did not show this to Dr. Doren Custard 1/18; I am using Iodoflex under compression a large wound on the right posterior calf. He had his right greater saphenous vein ablation by Dr. Doren Custard although I am not sure that is the only problem here. ooThe right fifth toe which was possibly trauma 2 weeks ago continues to be exceptionally painful with a necrotic tip. Maybe not quite as swollen. I started him empirically on Augmentin 5/125 1 p.o. twice daily last week  after discussing this with Dr. Loletha Grayer of nephrology. Perhaps somewhat better this week but not as good as I was hoping. A plain x-ray was negative. He comes in today with an area on the medial right calf that was blistered and now open. In looking at things he appears to be systemically fluid volume overloaded. He has a transplanted kidney. He states he takes his Lasix variably when he has appointments he tries not to take it the later I am really not certain if he takes this reliably. However he has far too much edema fluid in the right leg to easily heal this wound and he appears to be developing blisters medially to form additional wounds 1/25; his CO2 angiogram was done by Dr. Doren Custard last week. This showed the proximal arteries all to be patent. On the right side the common femoral deep femoral superficial femoral popliteal anterior tibial and peroneal arteries were patent. The posterior tibial was occluded but reconstituted distally via collaterals from the peroneal artery he was felt he should have enough blood flow to heal his wounds including the right fifth toe. The right fifth toe looks better however he is still complaining of a lot of pain. The large area which is a venous ulceration posteriorly has a better looking surface I think we can switch to Hydrofera Blue today 2/1; the patient's original wounds on the right posterior medial calf has come down in width however superior to this he has new denuded areas and I am concerned we simply do not have enough edema control. He has already undergone a right greater saphenous ablation. He had a CO2 angiogram done by Dr. Doren Custard and the comment was that we should have enough blood flow to heal the venous wound however we simply do not seem to have enough edema control with 3 layer compression. The patient is status post kidney transplant although his exact renal function is not really clear. Nor am i sure what dose of diuretic he is supposed to be  on. He also has the area on the  right fifth toe which was unclear etiology but I think became secondarily infected I gave him 2 weeks of antibiotics for this and this seems to have settled down he still has a black eschar over the tip of the toe. X-ray was negative for fracture. He says he has a history of gout. 2/8; the patient's wound on the right posterior calf is about the same. The superficial area medially also about the same. He got a prescription for prednisoneo Gout after he developed erythema on the dorsal aspect of his left great toe going along with the right fifth toe which has been problematic all along. He has not taken it because he is concerned about increasing CBGs. He has a transplanted kidney is already on prednisone 5 mg. He would not be a good candidate for NSAIDs. Perhaps colchicine. He is not aware of what his uric acid level is 2/15; his right posterior calf wound seems to be coming in in terms of width. Everything here looks fairly good. No mechanical debridement we have been using Hydrofera Blue. He has had an ablation by vein and vascular. Felt to have adequate arterial supply to this area. The patient got prednisone last week from Dr. Angelique Holm of nephrology. At my urging he actually took it. The area on his left toe was a lot better. The right toe was not as painful but still erythematous with a wound at the tip. We have been using silver alginate here 2/22; right posterior calf seems to be gradually epithelializing. Still a fairly substantial wound. He still has an area on the tip of his right fifth toe which I think was gout related. This is gradually improving. We have been using Unna boots to wrap X-ray I did of the foot last time was again negative there was soft tissue irregularity about the distal fifth digit but no radiographic evidence of bone damage 3/1; right posterior calf seems to be gradually improving however there were areas of hyper granulation.  We have been using Hydrofera Blue. The hyper granulation is mostly evident in the most superior finger shape projection of the wound itself. ooThe area on the right fifth toe also was slough covered and required debridement. 3/8 continued improvement in the right posterior calf and the tip of the right fifth toe. We have been using Hydrofera Blue under compression he is changing the area to the toe 3/22; continued improvement in the right posterior calf. Unfortunately comes in today with a new skin tear in the mid anterior tibia area this probably had something to do with changing his dressing home health I called and left this a message last week. 3/29; continued improvement in the a substantial wound on the right posterior calf. The skin tear anteriorly from last week is already healed on the tip of the right fifth toe there is still a nonviable area. 4/5; we have continued contraction of the substantial wound on the right posterior calf. We continue to have problems with the tip of the right fifth toe. We have been using Hydrofera Blue to both wound areas He is complaining about some discomfort in the Achilles area. Tells me he had surgery for what sounds like a torn Achilles about 10 years ago. Objective Constitutional Patient is hypertensive.. Pulse regular and within target range for patient.Marland Kitchen Respirations regular, non-labored and within target range.. Temperature is normal and within the target range for the patient.Marland Kitchen Appears in no distress. Vitals Time Taken: 10:16 AM, Height: 67 in, Weight: 190 lbs,  BMI: 29.8, Temperature: 98.3 F, Pulse: 53 bpm, Respiratory Rate: 16 breaths/min, Blood Pressure: 159/67 mmHg, Capillary Blood Glucose: 101 mg/dl. General Notes: CBG per patient Musculoskeletal I did not see anything obviously wrong with his Achilles.. General Notes: Wound exam; ooRight fifth toe is right at the tip of the toe. I used a #3 curette again to remove necrotic debris from  the surface this has quite a significant amount of depth although it does not probe to bone. Necrotic tissue removed. ooThe large area in the posterior calf which was the venous wound that he came in to see [place] with continues to contract nicely. Nice rims of epithelialization Integumentary (Hair, Skin) No evidence of infection in any site.. Wound #3 status is Open. Original cause of wound was Trauma. The wound is located on the Right,Posterior Lower Leg. The wound measures 10cm length x 3.5cm width x 0.1cm depth; 27.489cm^2 area and 2.749cm^3 volume. There is Fat Layer (Subcutaneous Tissue) Exposed exposed. There is no tunneling or undermining noted. There is a medium amount of serosanguineous drainage noted. The wound margin is distinct with the outline attached to the wound base. There is large (67-100%) pink, hyper - granulation within the wound bed. There is a small (1-33%) amount of necrotic tissue within the wound bed including Adherent Slough. Wound #5 status is Open. Original cause of wound was Blister. The wound is located on the Right Toe Fifth. The wound measures 0.4cm length x 0.3cm width x 0.1cm depth; 0.094cm^2 area and 0.009cm^3 volume. There is Fat Layer (Subcutaneous Tissue) Exposed exposed. There is no tunneling or undermining noted. There is a small amount of serous drainage noted. The wound margin is distinct with the outline attached to the wound base. There is small (1-33%) pink granulation within the wound bed. There is a large (67-100%) amount of necrotic tissue within the wound bed including Adherent Slough. Assessment Active Problems ICD-10 Contusion of right lower leg, subsequent encounter Non-pressure chronic ulcer of right calf with fat layer exposed Chronic venous hypertension (idiopathic) with inflammation of right lower extremity Type 2 diabetes mellitus with other skin ulcer Non-pressure chronic ulcer of other part of right foot with other specified  severity Procedures Wound #5 Pre-procedure diagnosis of Wound #5 is a Diabetic Wound/Ulcer of the Lower Extremity located on the Right Toe Fifth .Severity of Tissue Pre Debridement is: Fat layer exposed. There was a Excisional Skin/Subcutaneous Tissue Debridement with a total area of 0.12 sq cm performed by Ricard Dillon., MD. With the following instrument(s): Curette to remove Viable and Non-Viable tissue/material. Material removed includes Subcutaneous Tissue after achieving pain control using Other (Benzocaine 20%). No specimens were taken. A time out was conducted at 10:39, prior to the start of the procedure. A Minimum amount of bleeding was controlled with Pressure. The procedure was tolerated well with a pain level of 0 throughout and a pain level of 0 following the procedure. Post Debridement Measurements: 0.4cm length x 0.3cm width x 0.1cm depth; 0.009cm^3 volume. Character of Wound/Ulcer Post Debridement is improved. Severity of Tissue Post Debridement is: Fat layer exposed. Post procedure Diagnosis Wound #5: Same as Pre-Procedure Wound #3 Pre-procedure diagnosis of Wound #3 is a Venous Leg Ulcer located on the Right,Posterior Lower Leg . There was a Haematologist Compression Therapy Procedure by [name], RN. Post procedure Diagnosis Wound #3: Same as Pre-Procedure Plan Follow-up Appointments: Return Appointment in 1 week. Dressing Change Frequency: Wound #3 Right,Posterior Lower Leg: Other: - twice a week - wound clinic to  change 1x a week on Monday, home health to change 1x a week on Thursday or Friday Wound #5 Right Toe Fifth: Other: - twice a week - wound clinic to change 1x a week on Monday, home health to change 1x a week on Thursday or Friday Skin Barriers/Peri-Wound Care: Wound #3 Right,Posterior Lower Leg: Barrier cream Moisturizing lotion - to leg Wound Cleansing: Clean wound with Normal Saline. - or normal saline on days that dressing is changed May  shower with protection. Primary Wound Dressing: Wound #3 Right,Posterior Lower Leg: Hydrofera Blue - Classic Wound #5 Right Toe Fifth: Silver Collagen - moisten with hydrogel or KY jelly Secondary Dressing: Wound #3 Right,Posterior Lower Leg: Foam - foam top bend of foot for protection ABD pad Zetuvit or Kerramax - or Xtrasorb Wound #5 Right Toe Fifth: Kerlix/Rolled Gauze Dry Gauze Other: - felt or rolled up gauze under toe to elevate tip of toe off of shoe Edema Control: Unna Boot to Right Lower Extremity - *****ONLY ONE SINGLE LAYER OF UNNA BOOT***** - foam to achilles to pad Avoid standing for long periods of time Elevate legs to the level of the heart or above for 30 minutes daily and/or when sitting, a frequency of: - throughout the day Exercise regularly Additional Orders / Instructions: Other: - Go to ER if toe/leg gets more red/black, increased pain, fever Home Health: [facility] for wound care. - [facility] 1. I change the primary dressing on the right fifth toe to silver collagen 2. Continuing with Hydrofera Blue to the right posterior lower leg wound. 3. We have home health changing the dressings 4. I did not see anything wrong with his Achilles area however I will have another look at this next week Electronic Signature(s) Signed: 12/24/2019 5:24:04 PM By: [name] MD Entered By: [name] on 12/24/2019 10:58:34 -------------------------------------------------------------------------------- SuperBill Details Patient Name: Date of Service: [name], [name] 12/24/2019 Medical Record [id number] Patient Account Number: [id number] Date of Birth/Sex: Treating RN: Jan 26, 1946 (73 y.o. [name] Primary Care Provider: [name] Other Clinician: Referring Provider: Treating Provider/Extender:Riyaan Heroux, [name], [name] in Treatment: 20 Diagnosis Coding ICD-10 Codes Code Description S80.11XD  Contusion of right lower leg, subsequent encounter L97.212 Non-pressure chronic ulcer of right calf with fat layer exposed I87.321 Chronic venous hypertension (idiopathic) with inflammation of right lower extremity E11.622 Type 2 diabetes mellitus with other skin ulcer L97.518 Non-pressure chronic ulcer of other part of right foot with other specified severity Facility Procedures CPT4 Code Description: 62563893 11042 - DEB SUBQ TISSUE 20 SQ CM/< ICD-10 Diagnosis Description L97.518 Non-pressure chronic ulcer of other part of right foot with o Modifier: ther specified s Quantity: 1 everity CPT4 Code Description: 73428768 (Facility Use Only) 11572IO - APPLY UNNA BOOT RT Modifier: 49 Quantity: 1 Physician Procedures CPT4 Code Description: 0355974 16384 - WC PHYS SUBQ TISS 20 SQ CM ICD-10 Diagnosis Description L97.518 Non-pressure chronic ulcer of other part of right foot with o Modifier: ther specified se Quantity: 1 verity Electronic Signature(s) Signed: 12/24/2019 5:09:30 PM By: [name] RN, BSN Signed: 12/24/2019 5:24:04 PM By: [name] MD Entered By: [name] on 12/24/2019 12:06:48

## 2019-12-25 ENCOUNTER — Ambulatory Visit (HOSPITAL_COMMUNITY)
Admission: RE | Admit: 2019-12-25 | Discharge: 2019-12-25 | Disposition: A | Discharge status: Home / Self Care | Payer: Medicare Other | Source: Ambulatory Visit | Attending: Nephrology | Admitting: Nephrology

## 2019-12-25 ENCOUNTER — Other Ambulatory Visit: Payer: Self-pay

## 2019-12-25 DIAGNOSIS — N2581 Secondary hyperparathyroidism of renal origin: Secondary | ICD-10-CM | POA: Insufficient documentation

## 2019-12-25 DIAGNOSIS — D631 Anemia in chronic kidney disease: Secondary | ICD-10-CM | POA: Diagnosis not present

## 2019-12-25 DIAGNOSIS — N185 Chronic kidney disease, stage 5: Secondary | ICD-10-CM | POA: Insufficient documentation

## 2019-12-25 LAB — HEMOGLOBIN AND HEMATOCRIT, BLOOD
HCT: 32.3 % — ABNORMAL LOW (ref 39.0–52.0)
Hemoglobin: 9.5 g/dL — ABNORMAL LOW (ref 13.0–17.0)

## 2019-12-25 MED ORDER — DARBEPOETIN ALFA 200 MCG/0.4ML IJ SOSY
200.0000 ug | PREFILLED_SYRINGE | INTRAMUSCULAR | Status: DC
  Administered 2019-12-25: 200 ug via SUBCUTANEOUS
  Filled 2019-12-25: qty 0.4

## 2019-12-25 NOTE — Progress Notes (Signed)
PATIENT CARE CENTER NOTE  Diagnosis:Anemia associated with Chronic Renal Failure   Provider:Coladonato, Broadus John, MD   Procedure:Aranesp injections   Note:Patient received sub-q Aranesp injectionin left arm. Tolerated injection well. No adverse reaction noted. Pre-injection Hemoglobin9.5and BP 121/58. Discharge paperwork given to patient. Alert, oriented and ambulatory at discharge.

## 2019-12-25 NOTE — Discharge Instructions (Signed)
Darbepoetin Alfa injection What is this medicine? DARBEPOETIN ALFA (dar be POE e tin AL fa) helps your body make more red blood cells. It is used to treat anemia caused by chronic kidney failure and chemotherapy. This medicine may be used for other purposes; ask your health care provider or pharmacist if you have questions. COMMON BRAND NAME(S): Aranesp What should I tell my health care provider before I take this medicine? They need to know if you have any of these conditions:  blood clotting disorders or history of blood clots  cancer patient not on chemotherapy  cystic fibrosis  heart disease, such as angina, heart failure, or a history of a heart attack  hemoglobin level of 12 g/dL or greater  high blood pressure  low levels of folate, iron, or vitamin B12  seizures  an unusual or allergic reaction to darbepoetin, erythropoietin, albumin, hamster proteins, latex, other medicines, foods, dyes, or preservatives  pregnant or trying to get pregnant  breast-feeding How should I use this medicine? This medicine is for injection into a vein or under the skin. It is usually given by a health care professional in a hospital or clinic setting. If you get this medicine at home, you will be taught how to prepare and give this medicine. Use exactly as directed. Take your medicine at regular intervals. Do not take your medicine more often than directed. It is important that you put your used needles and syringes in a special sharps container. Do not put them in a trash can. If you do not have a sharps container, call your pharmacist or healthcare provider to get one. A special MedGuide will be given to you by the pharmacist with each prescription and refill. Be sure to read this information carefully each time. Talk to your pediatrician regarding the use of this medicine in children. While this medicine may be used in children as young as 1 month of age for selected conditions, precautions do  apply. Overdosage: If you think you have taken too much of this medicine contact a poison control center or emergency room at once. NOTE: This medicine is only for you. Do not share this medicine with others. What if I miss a dose? If you miss a dose, take it as soon as you can. If it is almost time for your next dose, take only that dose. Do not take double or extra doses. What may interact with this medicine? Do not take this medicine with any of the following medications:  epoetin alfa This list may not describe all possible interactions. Give your health care provider a list of all the medicines, herbs, non-prescription drugs, or dietary supplements you use. Also tell them if you smoke, drink alcohol, or use illegal drugs. Some items may interact with your medicine. What should I watch for while using this medicine? Your condition will be monitored carefully while you are receiving this medicine. You may need blood work done while you are taking this medicine. This medicine may cause a decrease in vitamin B6. You should make sure that you get enough vitamin B6 while you are taking this medicine. Discuss the foods you eat and the vitamins you take with your health care professional. What side effects may I notice from receiving this medicine? Side effects that you should report to your doctor or health care professional as soon as possible:  allergic reactions like skin rash, itching or hives, swelling of the face, lips, or tongue  breathing problems  changes in   vision  chest pain  confusion, trouble speaking or understanding  feeling faint or lightheaded, falls  high blood pressure  muscle aches or pains  pain, swelling, warmth in the leg  rapid weight gain  severe headaches  sudden numbness or weakness of the face, arm or leg  trouble walking, dizziness, loss of balance or coordination  seizures (convulsions)  swelling of the ankles, feet, hands  unusually weak or  tired Side effects that usually do not require medical attention (report to your doctor or health care professional if they continue or are bothersome):  diarrhea  fever, chills (flu-like symptoms)  headaches  nausea, vomiting  redness, stinging, or swelling at site where injected This list may not describe all possible side effects. Call your doctor for medical advice about side effects. You may report side effects to FDA at 1-800-FDA-1088. Where should I keep my medicine? Keep out of the reach of children. Store in a refrigerator between 2 and 8 degrees C (36 and 46 degrees F). Do not freeze. Do not shake. Throw away any unused portion if using a single-dose vial. Throw away any unused medicine after the expiration date. NOTE: This sheet is a summary. It may not cover all possible information. If you have questions about this medicine, talk to your doctor, pharmacist, or health care provider.  2020 Elsevier/Gold Standard (2017-09-21 16:44:20)  

## 2019-12-26 NOTE — Progress Notes (Signed)
[name], [name] ([id number]) Visit Report for 12/24/2019 Arrival Information Details Patient Name: Date of Service: [name], [name] 12/24/2019 10:15 AM Medical Record [id number] Patient Account Number: [id number] Date of Birth/Sex: Treating RN: 19-May-1946 (73 y.o. [name]) Carlene Coria Primary Care Marcos Peloso: [name] Other Clinician: Referring Anael Rosch: Treating Caitlan Chauca/Extender:Robson, [name], [name] in Treatment: 20 Visit Information History Since Last Visit All ordered tests and consults were completed: No Patient Arrived: Ambulatory Added or deleted any medications: No Arrival Time: 10:15 Any new allergies or adverse reactions: No Accompanied By: self Had a fall or experienced change in No Transfer Assistance: None activities of daily living that may affect Patient Identification Verified: Yes risk of falls: Secondary Verification Process Yes Signs or symptoms of abuse/neglect since last No Completed: visito Patient Requires Transmission-Based No Hospitalized since last visit: No Precautions: Implantable device outside of the clinic excluding No Patient Has Alerts: Yes cellular tissue based products placed in the center Patient Alerts: non since last visit: compressable Has Dressing in Place as Prescribed: Yes Has Compression in Place as Prescribed: Yes Pain Present Now: No Electronic Signature(s) Signed: 12/25/2019 6:43:44 PM By: Carlene Coria RN Entered By: Carlene Coria on 12/24/2019 10:16:28 -------------------------------------------------------------------------------- Compression Therapy Details Patient Name: Date of Service: [name], [name] 12/24/2019 10:15 AM Medical Record [id number] Patient Account Number: [id number] Date of Birth/Sex: Treating RN: 1946/03/05 (74 y.o. [name] Primary Care Matyas Baisley: [name] Other Clinician: Referring Veronique Warga: Treating Jiaire Rosebrook/Extender:Robson,  [name], [name] in Treatment: 20 Compression Therapy Performed for Wound Wound #3 Right,Posterior Lower Leg Assessment: Performed By: Clinician [name], RN Compression Type: Rolena Infante Post Procedure Diagnosis Same as Pre-procedure Electronic Signature(s) Signed: 12/24/2019 5:09:30 PM By: [name] RN, BSN Entered By: [name] on 12/24/2019 10:41:07 -------------------------------------------------------------------------------- Encounter Discharge Information Details Patient Name: Date of Service: [name], [name] 12/24/2019 10:15 AM Medical Record [id number] Patient Account Number: [id number] Date of Birth/Sex: Treating RN: [birth year] (74 y.o. [name], Tammi Klippel Primary Care Grayden Burley: [name] Other Clinician: Referring Semaja Lymon: Treating Thurl Boen/Extender:Robson, [name], [name] in Treatment: 20 Encounter Discharge Information Items Post Procedure Vitals Discharge Condition: Stable Temperature (F): 98.3 Ambulatory Status: Ambulatory Pulse (bpm): 53 Discharge Destination: Home Respiratory Rate (breaths/min): 16 Transportation: Private Auto Blood Pressure (mmHg): 159/67 Accompanied By: self Schedule Follow-up Appointment: Yes Clinical Summary of Care: Electronic Signature(s) Signed: 12/24/2019 5:03:02 PM By: [name] Entered By: [name] on 12/24/2019 10:47:19 -------------------------------------------------------------------------------- Lower Extremity Assessment Details Patient Name: Date of Service: [name], [name] 12/24/2019 10:15 AM Medical Record [id number] Patient Account Number: [id number] Date of Birth/Sex: Treating RN: Feb 01, 1946 (73 y.o. [name]) Carlene Coria Primary Care Asianae Minkler: [name] Other Clinician: Referring Chaney Ingram: Treating Aydrian Halpin/Extender:Robson, [name], [name] in Treatment: 20 Edema Assessment Assessed: [Left: No] [Right:  No] Edema: [Left: Ye] [Right: s] Calf Left: Right: Point of Measurement: 30 cm From Medial Instep cm 35 cm Ankle Left: Right: Point of Measurement: 10 cm From Medial Instep cm 23 cm Electronic Signature(s) Signed: 12/25/2019 6:43:44 PM By: Carlene Coria RN Entered By: Carlene Coria on 12/24/2019 10:22:32 -------------------------------------------------------------------------------- Multi Wound Chart Details Patient Name: Date of Service: [name], [name] 12/24/2019 10:15 AM Medical Record [id number] Patient Account Number: [id number] Date of Birth/Sex: Treating RN: Feb 17, 1946 (74 y.o. [name] Primary Care [place] Reader: [name] Other Clinician: Referring Ayomide Zuleta: Treating Croy Drumwright/Extender:Robson, [name], [name] in Treatment: 20 Vital Signs Height(in): 67 Capillary Blood 101 Glucose(mg/dl): Weight(lbs): 190 Pulse(bpm): 53 Body Mass Index(BMI): 30 Blood Pressure(mmHg): 159/67 Temperature(F):  98.3 Respiratory 16 Rate(breaths/min): Photos: [3:No Photos] [5:No Photos] [N/A:N/A] Wound Location: [3:Right, Posterior Lower Leg Right Toe Fifth] [N/A:N/A] Wounding Event: [3:Trauma] [5:Blister] [N/A:N/A] 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] [5:Diabetic Wound/Ulcer of the N/A Lower Extremity] Comorbid History: [3:Cataracts, Anemia, Arrhythmia, Congestive Heart Failure, Hypertension, Heart Failure, Hypertension, Type II Diabetes, Gout, Neuropathy] [5:Cataracts, Anemia, Arrhythmia, Congestive Type II Diabetes, Gout, Neuropathy] [N/A:N/A] Date Acquired: [3:06/06/2019] [5:09/19/2019] [N/A:N/A] Weeks of Treatment: [3:20] [5:13] [N/A:N/A] Wound Status: [3:Open] [5:Open] [N/A:N/A] Measurements L x W x D 10x3.5x0.1 [5:0.4x0.3x0.1] [N/A:N/A] (cm) Area (cm) : [1:95.093] [5:0.094] [N/A:N/A] Volume (cm) : [3:2.749] [5:0.009] [N/A:N/A] % Reduction in Area: [3:-22.00%] [5:84.00%] [N/A:N/A] % Reduction in Volume: 59.30% [5:84.70%]  [N/A:N/A] Classification: [3:Full Thickness Without Exposed Support Structures] [5:Grade 2] [N/A:N/A] Exudate Amount: [3:Medium] [5:Small] Exudate Type: [3:Serosanguineous] [5:Serous] Exudate Color: [3:red, brown] [5:amber] Wound Margin: [3:Distinct, outline attached] [5:Distinct, outline attached] Granulation Amount: [3:Large (67-100%)] [5:Small (1-33%)] Granulation Quality: [3:Pink, Hyper-granulation] [5:Pink] Necrotic Amount: [3:Small (1-33%)] [5:Large (67-100%)] Exposed Structures: [3:Fat Layer (Subcutaneous Tissue) Exposed: Yes Fascia: No Tendon: No Muscle: No Joint: No Bone: No] [5:Fat Layer (Subcutaneous Tissue) Exposed: Yes Fascia: No Tendon: No Muscle: No Joint: No Bone: No] Epithelialization: [3:Medium (34-66%)] [5:Small (1-33%)] Debridement: [3:N/A] [5:Debridement - Excisional] Pre-procedure [3:N/A] [5:10:39] Verification/Time Out Taken: Pain Control: [3:N/A] [5:Other] Tissue Debrided: [3:N/A] [5:Subcutaneous] Level: [3:N/A] [5:Skin/Subcutaneous Tissue] Debridement Area (sq cm):N/A [5:0.12] Instrument: [3:N/A] [5:Curette] Bleeding: [3:N/A] [5:Minimum] Hemostasis Achieved: [3:N/A] [5:Pressure] Procedural Pain: [3:N/A] [5:0] Post Procedural Pain: [3:N/A] [5:0] Debridement Treatment N/A [5:Procedure was tolerated] Response: [5:well] Post Debridement [3:N/A] [5:0.4x0.3x0.1] Measurements L x W x D (cm) Post Debridement [3:N/A] [5:0.009] Volume: (cm) Procedures Performed: Compression Therapy [5:Debridement] Treatment Notes Wound #3 (Right, Posterior Lower Leg) 1. Cleanse With Wound Cleanser Soap and water 2. Periwound Care Moisturizing lotion 3. Primary Dressing Applied Hydrofera Blue 4. Secondary Dressing ABD Pad 6. Support Layer Kelly Services Notes HB classic with normal saline. foam applied for protection at achilles area per patient. Wound #5 (Right Toe Fifth) 1. Cleanse With Wound Cleanser Soap and water 3. Primary Dressing Applied Collegen  AG Hydrogel or K-Y Jelly 4. Secondary Dressing Dry Gauze Roll Gauze 5. Secured With Medipore tape Notes applied a rolled gauze under toe as directed. Electronic Signature(s) Signed: 12/24/2019 5:09:30 PM By: [name] RN, BSN Signed: 12/24/2019 5:24:04 PM By: [name] MD Entered By: [name] on 12/24/2019 10:53:10 -------------------------------------------------------------------------------- Multi-Disciplinary Care Plan Details Patient Name: Date of Service: [name], [name] 12/24/2019 10:15 AM Medical Record [id number] Patient Account Number: [id number] Date of Birth/Sex: Treating RN: [birth year] (73 y.o. [name] Primary Care Angelyne Terwilliger: [name] Other Clinician: Referring Boaz Berisha: Treating Jeyden Coffelt/Extender:Robson, [name], [name] in Treatment: 20 Active Inactive Wound/Skin Impairment Nursing Diagnoses: Impaired tissue integrity Goals: Patient/caregiver will verbalize understanding of skin care regimen Date Initiated: 08/06/2019 Target Resolution Date: 01/25/2020 Goal Status: Active Ulcer/skin breakdown will have a volume reduction of 30% by week 4 Date Initiated: 08/06/2019 Date Inactivated: 09/03/2019 Target Resolution Date: 09/07/2019 Unmet Reason: PAD, Goal Status: Unmet necrotic surface Interventions: Assess patient/caregiver ability to obtain necessary supplies Assess patient/caregiver ability to perform ulcer/skin care regimen upon admission and as needed Assess ulceration(s) every visit Provide education on ulcer and skin care Notes: Electronic Signature(s) Signed: 12/24/2019 5:09:30 PM By: [name] RN, BSN Entered By: [name] on 12/24/2019 12:04:28 -------------------------------------------------------------------------------- Pain Assessment Details Patient Name: Date of Service: [name], [name] 12/24/2019 10:15 AM Medical Record [id number] Patient Account Number:  [id number] Date of Birth/Sex: Treating RN: 1945-11-29 (73 y.o. [name]) Dolores Lory, [place]  Primary Care Kobee Medlen: [name] Other Clinician: Referring Berthe Oley: Treating Riad Wagley/Extender:Robson, [name], [name] in Treatment: 20 Active Problems Location of Pain Severity and Description of Pain Patient Has Paino No Site Locations Pain Management and Medication Current Pain Management: Electronic Signature(s) Signed: 12/25/2019 6:43:44 PM By: Carlene Coria RN Entered By: Carlene Coria on 12/24/2019 10:17:14 -------------------------------------------------------------------------------- Patient/Caregiver Education Details Patient Name: Date of Service: [name], [name] 4/5/2021andnbsp10:15 AM Medical Record [id number] Patient Account Number: [id number] Date of Birth/Gender: Treating RN: 1946/05/06 (74 y.o. [name] Primary Care Physician: [name] Other Clinician: Referring Physician: Treating Physician/Extender:Robson, [name], [name] in Treatment: 20 Education Assessment Education Provided To: Patient Education Topics Provided Wound/Skin Impairment: Methods: Explain/Verbal Responses: State content correctly Electronic Signature(s) Signed: 12/24/2019 5:09:30 PM By: [name] RN, BSN Entered By: [name] on 12/24/2019 12:05:05 -------------------------------------------------------------------------------- Wound Assessment Details Patient Name: Date of Service: [name], [name] 12/24/2019 10:15 AM Medical Record [id number] Patient Account Number: [id number] Date of Birth/Sex: Treating RN: 1946/09/10 (73 y.o. [name]) Carlene Coria Primary Care Meagan Ancona: [name] Other Clinician: Referring Jodelle Fausto: Treating Tremaine Earwood/Extender:Robson, [name], [name] in Treatment: 20 Wound Status Wound Number: 3 Primary Venous Leg Ulcer Etiology: Wound Location: Right, Posterior Lower  Leg Wound Open Wounding Event: Trauma Status: Date Acquired: 06/06/2019 Comorbid Cataracts, Anemia, Arrhythmia, Congestive Weeks Of Treatment: 20 History: Heart Failure, Hypertension, Type II Diabetes, Clustered Wound: No Gout, Neuropathy Wound Measurements Length: (cm) 10 % Reduct Width: (cm) 3.5 % Reduct Depth: (cm) 0.1 Epitheli Area: (cm) 27.489 Tunneli Volume: (cm) 2.749 Undermi Wound Description Full Thickness Without Exposed Support Foul Od Classification: Structures Slough/ Wound Distinct, outline attached Margin: Exudate Medium Amount: Exudate Serosanguineous Type: Exudate red, brown red, brown Color: Wound Bed Granulation Amount: Large (67-100%) Granulation Quality: Pink, Hyper-granulation Fasc Necrotic Amount: Small (1-33%) Fat Necrotic Quality: Adherent Slough Tend Musc Join Bone or After Cleansing: No Fibrino Yes Exposed Structure ia Exposed: No Layer (Subcutaneous Tissue) Exposed: Yes on Exposed: No le Exposed: No t Exposed: No Exposed: No ion in Area: -22% ion in Volume: 59.3% alization: Medium (34-66%) ng: No ning: No Treatment Notes Wound #3 (Right, Posterior Lower Leg) 1. Cleanse With Wound Cleanser Soap and water 2. Periwound Care Moisturizing lotion 3. Primary Dressing Applied Hydrofera Blue 4. Secondary Dressing ABD Pad 6. Support Layer Kelly Services Notes HB classic with normal saline. foam applied for protection at achilles area per patient. Electronic Signature(s) Signed: 12/25/2019 6:43:44 PM By: Carlene Coria RN Entered By: Carlene Coria on 12/24/2019 10:23:41 -------------------------------------------------------------------------------- Wound Assessment Details Patient Name: Date of Service: [name], [name] 12/24/2019 10:15 AM Medical Record [id number] Patient Account Number: [id number] Date of Birth/Sex: Treating RN: [birth year] (73 y.o. [name]) Carlene Coria Primary Care Shya Kovatch: [name] Other  Clinician: Referring Sosie Gato: Treating Samaia Iwata/Extender:Robson, [name], [name] in Treatment: 20 Wound Status Wound Number: 5 Primary Diabetic Wound/Ulcer of the Lower Extremity Etiology: Wound Location: Right Toe Fifth Wound Open Wounding Event: Blister Status: Date Acquired: 09/19/2019 Comorbid Cataracts, Anemia, Arrhythmia, Congestive Weeks Of Treatment: 13 History: Heart Failure, Hypertension, Type II Diabetes, Clustered Wound: No Gout, Neuropathy Wound Measurements Length: (cm) 0.4 % Re Width: (cm) 0.3 % Reduct Depth: (cm) 0.1 Epitheli Area: (cm) 0.094 Tunneli Volume: (cm) 0.009 Undermi Wound Description Classification: Grade 2 Wound Margin: Distinct, outline attached Exudate Amount: Small Exudate Type: Serous Exudate Color: amber Wound Bed Granulation Amount: Small (1-33%) Granulation Quality: Pink Necrotic Amount: Large (67-100%) Necrotic Quality: Adherent Slough Foul Odor After Cleansing: No  Slough/Fibrino Yes Exposed Structure Fascia Exposed: No Fat Layer (Subcutaneous Tissue) Exposed: Yes Tendon Exposed: No Muscle Exposed: No Joint Exposed: No Bone Exposed: No duction in Area: 84% ion in Volume: 84.7% alization: Small (1-33%) ng: No ning: No Treatment Notes Wound #5 (Right Toe Fifth) 1. Cleanse With Wound Cleanser Soap and water 3. Primary Dressing Applied Collegen AG Hydrogel or K-Y Jelly 4. Secondary Dressing Dry Gauze Roll Gauze 5. Secured With Medipore tape Notes applied a rolled gauze under toe as directed. Electronic Signature(s) Signed: 12/25/2019 6:43:44 PM By: Carlene Coria RN Entered By: Carlene Coria on 12/24/2019 10:24:15 -------------------------------------------------------------------------------- Vitals Details Patient Name: Date of Service: [name], [name] 12/24/2019 10:15 AM Medical Record [id number] Patient Account Number: [id number] Date of Birth/Sex: Treating RN: Jul 21, 1946 (73 y.o. [name])  Carlene Coria Primary Care Jaeceon Michelin: [name] Other Clinician: Referring Raffael Bugarin: Treating Cova Knieriem/Extender:Robson, [name], [name] in Treatment: 20 Vital Signs Time Taken: 10:16 Temperature (F): 98.3 Height (in): 67 Pulse (bpm): 53 Weight (lbs): 190 Respiratory Rate (breaths/min): 16 Body Mass Index (BMI): 29.8 Blood Pressure (mmHg): 159/67 Capillary Blood Glucose (mg/dl): 101 Reference Range: 80 - 120 mg / dl Notes CBG per patient Electronic Signature(s) Signed: 12/25/2019 6:43:44 PM By: Carlene Coria RN Entered By: Carlene Coria on 12/24/2019 10:17:06

## 2019-12-31 ENCOUNTER — Encounter (HOSPITAL_BASED_OUTPATIENT_CLINIC_OR_DEPARTMENT_OTHER): Payer: Medicare Other | Admitting: Internal Medicine

## 2019-12-31 ENCOUNTER — Other Ambulatory Visit: Payer: Self-pay

## 2019-12-31 DIAGNOSIS — E11621 Type 2 diabetes mellitus with foot ulcer: Secondary | ICD-10-CM | POA: Diagnosis not present

## 2020-01-01 NOTE — Progress Notes (Signed)
[name], [name] ([id number]) Visit Report for 12/31/2019 HPI Details Patient Name: Date of Service: [name], [name] 12/31/2019 10:30 AM Medical Record [id number] Patient Account Number: [id number] Date of Birth/Sex: Treating RN: 04-Jan-1946 (74 y.o. [name] Primary Care Provider: [name] Other Clinician: Referring Provider: Treating Provider/Extender:Carden Teel, [name], [name] in Treatment: 21 History of Present Illness HPI Description: [name] HPI Description: 74 year old gentleman who was recently seen by his nephrologist [name], and noted to have a wound on his left lower extremity which was lacerated 2 months ago and now has reopened. The patient's left shin has a ulceration with some exudate but no evidence of infection and he was referred to [place] for further care as it was known that the patient has had some peripheral vascular disease in the past. Past medical history significant for chronic kidney disease, atrial fibrillation, diabetes mellitus,status post kidney transplant in 1983 and 2005, a week fistula graft placement, status post previous bowel surgery. 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 10/06/2015 -- x-ray of the left tibia and fibula shows no evidence of osteomyelitis. The patient has also had Doppler studies of his extremity and is awaiting the appointment with the vascular surgeon. We have not yet received these reports. 10/13/2015 -- lower extremity venous duplex reflux evaluation shows reflux in the left common femoral vein, left saphenofemoral junction and the proximal greater saphenous vein extending to the proximal calf. There is also reflux in the left proximal to mid small saphenous vein. Arterial duplex studies done showed the resting ABI was not applicable due to tibial artery medial calcification. 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 The right TBI was 1 which is normal and the left ABI was 1 which is normal. Patient has otherwise been doing fine and has been compliant with his dressings. 10/20/2015 -- He was seen by [name] recently for a vascular opinion on 10/15/2015. His left lower extremity venous insufficiency duplex study revealed GSV reflux,SS vein reflux and deep venous reflux in the common femoral vein. His ABIs were non compressible and his TBI on the right was 1.01 and on the left was 0.80. He was asked to continue with the wound care with compressive therapy followed by EVLA of the left GS vein 3 months. He recommended 20-30 mm thigh-high compression stockings and the need for a three-month trial of this. The patient had an Unna boot applied at the vascular office but he could not tolerate this with a lot of pain and issues with his toes and hence came here on Friday for removal of this and we reapplied a 2 layer compression. 11/10/2015 -- patient still has not purchased his 20-30 mm thigh-high compression stockings as prescribed by [name]. Readmission: 08/08/18 on evaluation today patient presents for readmission concerning a new injury to the left anterior lower extremity. He was previously seen in 2017 here in our clinic. He states that he has done fairly well since that point. Nonetheless he is having at this time some pain but states that he hit this on a table that fell over and actually struck his leg. This appears to have pulled back some of his skin which folded in on itself and is causing some difficulty as far as that is concerned. There does not appear to be any evidence of  infection at this time. No fevers, chills, nausea, or vomiting noted at this time. He's been using dressings on his own currently without complication. 08/15/18 on evaluation today patient actually appears to be  doing somewhat better in regard to his wound of the lower [name] when compared to the first visit last week. I had to do a much more extensive debridement at that time it does appear that I'm gonna have to perform some debridement today but it does not look to be as extensive by any means. Nonetheless fortunately he does not show any signs of infection he does have discomfort at this site. I believe based on what I'm seeing currently he may benefit from Iodoflex to help keep the wound bed clean. Patient tolerated therapy without complication. Upon evaluation today the patient actually appears to be doing excellent in regard to his left lower extremity ulcer. This is much better than the previous two visits where he had a lot of necrotic tissue around the edge of the wound simply due to the fact that again there was a significant skin tear where the edge had been cleared away prior to reattaching and being able to heal appropriately. He seems to be doing much better at this point. 08/28/18 on evaluation today the patient's wound actually does appear to be showing signs of improvement. With that being said though he is improving he would likely note even greater improvement if we were able to sharply debride the wound. Nonetheless this caused him to much discomfort he tells me. 09/04/18 on evaluation today patient actually appears to be showing signs of improvement in regard to his left lower extremity ulcer. He has been tolerating the dressing changes including the wrap although he tells me at this point that the burning does last for a couple of days even with just the Iodoflex. I was afraid that this may been part of the issue that he was having with discomfort. It does seem to be the case. Nonetheless he shows no signs of evidence of infection at this time which is good news. No fevers chills noted ADMISSION to [name] 10/05/2018 This is a patient who was cared for in 2017  and again in the fall of this year at our sister clinic and [facility]. He actually lives in [place] in [place]. We have been dealing with an apparent traumatic area on the left anterior tibial area. This is been present for the last several months. He was supposed to be using Iodoflex Kerlix and Coban however he was hospitalized from 09/05/2018 through 09/11/2018 with delirium secondary to pneumonia. Since then he is only been putting Vaseline gauze on this without compression. He also has a more recent skin tear on the dorsal right hand that may have only happened in the last week. The patient had arterial studies done in 2017 in January which was 3 years ago. At that point he had noncompressible ABIs but really quite good TBI's both normal. Triphasic waveforms on the right monophasic at the left posterior tibial but triphasic at the left dorsalis pedis. His ABIs in our clinic today were both noncompressible 1/23; the patient has wounds on the right dorsal hand just distal to the wrist and on the left anterior lower extremity. Both of these look very healthy he is using Hydrofera Blue 1/30; left anterior lower extremity wound much smaller. Healthy looking surface. The laceration area just distal to the wrist on the dorsal hand on the right is also just  about closed I used Hydrofera Blue here 2/6; left anterior lower extremity wound is much smaller but still open. The laceration area just distal to the wrist on the dorsal hand is fully epithelialized. 2/13; the patient's anterior lower extremity wound is closed. The laceration just distal to the wrist on the dorsal hand is also fully epithelialized and closed. The patient has external compression stockings which I think are 20/30 READMISSION 08/06/2019 [name] is a 74 year old man with had several times previously in our clinic. He is a diabetic with a history of chronic renal insufficiency status post kidney transplant in 1983 and again  in 2005. He was then in 2017 with a laceration on the left lower extremity. He was worked up at the time with arterial studies and reflux studies. The arterial studies showed ABIs to be noncompressible but TBI's were within normal limits. I do not have the reflux studies at the moment. He was also sent here in 2019 with a left lower extremity wound and then again in 2020 with left lower extremity trauma a skin tear on the wrist. He was discharged with 20/30 stockings identified from myself that that might not be enough compression. Nevertheless he states he was wearing these fairly reliably. In September he had a fall with a substantial bruise in the area of the wound. He says he saw orthopedics and they told him there was some muscle strain sometime it later this opened into a wound. He has a fairly substantial wound on the right posterior calf. Satellite areas around this including medially and posteriorly. He has not worn his stockings since the injury Past medical history; includes chronic renal failure secondary to diabetes with kidney transplant x2, atrial fibrillation, heart failure with preserved ejection fraction, coronary artery disease. ABIs on the right in our clinic were once again noncompressible 08/13/2019 on evaluation today patient appears to be doing decently well with regard to his wound compared to last week's evaluation. Unfortunately he is still having a lot of discomfort at this point which is I think in some part due to the 3 layer compression wrap which is a little bit stronger I think for him. When he was here before we actually utilized a Kerlix and Coban wrap which he states seemed to got a little bit better. Nonetheless I think we can probably drop back to this in light of the discomfort that he had. Nonetheless the pain was not really right around the wound itself as much as it was around the ankle in particular. The Iodoflex does seem to have done well for him As the  wound is appearing somewhat better today which is excellent news. 11/30; still complaining of a lot of pain. Apparently arterial studies I ordered 2 weeks ago are below. I do not believe we have an appointment with vein and vascular as of yet; ABI Findings: +---------+------------------+-----+----------+--------+ Right Rt Pressure (mmHg)IndexWaveform Comment  +---------+------------------+-----+----------+--------+ PTA >254 1.50 monophasic  +---------+------------------+-----+----------+--------+ DP >254 1.50 monophasic  +---------+------------------+-----+----------+--------+ Great Toe68 0.40 Abnormal   +---------+------------------+-----+----------+--------+ +---------+------------------+-----+----------+-------+ Left Lt Pressure (mmHg)IndexWaveform Comment +---------+------------------+-----+----------+-------+ Brachial 169     +---------+------------------+-----+----------+-------+ PTA >254 1.50 monophasic  +---------+------------------+-----+----------+-------+ DP >254 1.50 monophasic  +---------+------------------+-----+----------+-------+ Great Toe40 0.24 Abnormal   +---------+------------------+-----+----------+-------+ Pedal arteries appear hyperemic. Patient refused Brachial pressure in the right arm. Summary: Right: Resting right ankle-brachial index indicates noncompressible right lower extremity arteries. The right toe- brachial index is abnormal. Left: Resting left ankle-brachial index indicates noncompressible left lower extremity arteries. The left toe-brachial index is abnormal.  He is also having considerably more swelling in his left calf. This was not there when I last saw him 2 weeks ago. He tells me that some of the home health compression wraps have been slipping down and that may be the issue here however a month I am uncertain 93/8; sees vascular on December 22. Still complaining of a lot of pain. DVT study  I did last time was negative for DVT 12/14; still complaining of pain which if this is arterial is certainly claudication and rest keeps him uncomfortable at night. He has an appointment with vein and vascular on December 22. Wound surface is better using Iodoflex. Once the surface of this is satisfactory and we have exhausted the vascular route. Perhaps an advanced treatment option. He has a configuration of the venous ulceration although his arterial studies are not very good. The other issue is the patient has a transplanted kidney. This will make angiography difficult and challenging issue 12/21; still complaining of pain and drainage. We are using Iodoflex on the wound under compression. He sees Dr. Donnetta Hutching tomorrow to evaluate his noninvasive studies noted above. He has a transplanted kidney further complicating the options for angiography. 12/28; still using Iodoflex under compression. I have Dr. Luther Parody note from 12/22. he noted arterial studies revealing monophasic waveforms at the pedal vessels bilaterally and calcified vessels making the ABI unreliable. He did not comment on the reduced TBI's. He felt these were venous wounds based on the palpable dorsalis pedis pulse. 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 And they arranged for formal venous duplex with reflux studies in the next several weeks. Follow-up with either Scot Dock or Dr. Oneida Alar 09/24/2019 still using Iodoflex under compression. He has an appointment with Dr. Doren Custard on 1/7 with regards to his venous disease. The patient was not felt to have a primary arterial problem for the nonhealing of his wound. We did attempt to wrap him with 3 layer when he first came into the clinic he complained of a lot of pain in the ankle although this may have been because the dressing fell down somewhat. He has far too edema fluid in the right leg for a good prognosis about healing this wound He comes in today with an excoriation on the  bottom part of his right fifth toe. He thinks he may have done this taking off his clothes and that something got caught on the toe. There is no open wound however the toe itself is very painful 1/11; we are using Iodoflex under compression. The wound bed is clean. He went on to see Dr. Doren Custard on 09/27/2019 he again feels that the patient's arterial supply is adequate. He feels that he might benefit from right greater saphenous vein ablation for a venous ulcer. In the meantime the area that he was complaining about last week on the right fifth toe. An x-ray that I ordered showed marked soft tissue swelling along the distal aspect of the right foot but there was no evidence of osteomyelitis. He comes in today with the fifth toenail just about coming off. He has black eschar underneath the toe on the plantar aspect. The toe was swollen red and very painful. In the right setting this could be a significant soft tissue infection versus ischemia of the toe itself. He did not show this to Dr. Doren Custard 1/18; I am using Iodoflex under compression a large wound on the right posterior calf. He had his right greater saphenous vein ablation by Dr. Doren Custard  although I am not sure that is the only problem here. The right fifth toe which was possibly trauma 2 weeks ago continues to be exceptionally painful with a necrotic tip. Maybe not quite as swollen. I started him empirically on Augmentin 5/125 1 p.o. twice daily last week after discussing this with Dr. Loletha Grayer of nephrology. Perhaps somewhat better this week but not as good as I was hoping. A plain x-ray was negative. He comes in today with an area on the medial right calf that was blistered and now open. In looking at things he appears to be systemically fluid volume overloaded. He has a transplanted kidney. He states he takes his Lasix variably when he has appointments he tries not to take it the later I am really not certain if he takes this reliably. However he has  far too much edema fluid in the right leg to easily heal this wound and he appears to be developing blisters medially to form additional wounds 1/25; his CO2 angiogram was done by Dr. Doren Custard last week. This showed the proximal arteries all to be patent. On the right side the common femoral deep femoral superficial femoral popliteal anterior tibial and peroneal arteries were patent. The posterior tibial was occluded but reconstituted distally via collaterals from the peroneal artery he was felt he should have enough blood flow to heal his wounds including the right fifth toe. The right fifth toe looks better however he is still complaining of a lot of pain. The large area which is a venous ulceration posteriorly has a better looking surface I think we can switch to Hydrofera Blue today 2/1; the patient's original wounds on the right posterior medial calf has come down in width however superior to this he has new denuded areas and I am concerned we simply do not have enough edema control. He has already undergone a right greater saphenous ablation. He had a CO2 angiogram done by Dr. Doren Custard and the comment was that we should have enough blood flow to heal the venous wound however we simply do not seem to have enough edema control with 3 layer compression. The patient is status post kidney transplant although his exact renal function is not really clear. Nor am i sure what dose of diuretic he is supposed to be on. He also has the area on the right fifth toe which was unclear etiology but I think became secondarily infected I gave him 2 weeks of antibiotics for this and this seems to have settled down he still has a black eschar over the tip of the toe. X-ray was negative for fracture. He says he has a history of gout. 2/8; the patient's wound on the right posterior calf is about the same. The superficial area medially also about the same. He got a prescription for prednisoneo Gout after he developed  erythema on the dorsal aspect of his left great toe going along with the right fifth toe which has been problematic all along. He has not taken it because he is concerned about increasing CBGs. He has a transplanted kidney is already on prednisone 5 mg. He would not be a good candidate for NSAIDs. Perhaps colchicine. He is not aware of what his uric acid level is 2/15; his right posterior calf wound seems to be coming in in terms of width. Everything here looks fairly good. No mechanical debridement we have been using Hydrofera Blue. He has had an ablation by vein and vascular. Felt to have adequate arterial supply  to this area. The patient got prednisone last week from Dr. Angelique Holm of nephrology. At my urging he actually took it. The area on his left toe was a lot better. The right toe was not as painful but still erythematous with a wound at the tip. We have been using silver alginate here 2/22; right posterior calf seems to be gradually epithelializing. Still a fairly substantial wound. He still has an area on the tip of his right fifth toe which I think was gout related. This is gradually improving. We have been using Unna boots to wrap X-ray I did of the foot last time was again negative there was soft tissue irregularity about the distal fifth digit but no radiographic evidence of bone damage 3/1; right posterior calf seems to be gradually improving however there were areas of hyper granulation. We have been using Hydrofera Blue. The hyper granulation is mostly evident in the most superior finger shape projection of the wound itself. The area on the right fifth toe also was slough covered and required debridement. 3/8 continued improvement in the right posterior calf and the tip of the right fifth toe. We have been using Hydrofera Blue under compression he is changing the area to the toe 3/22; continued improvement in the right posterior calf. Unfortunately comes in today with a new  skin tear in the mid anterior tibia area this probably had something to do with changing his dressing home health I called and left this a message last week. 3/29; continued improvement in the a substantial wound on the right posterior calf. The skin tear anteriorly from last week is already healed on the tip of the right fifth toe there is still a nonviable area. 4/5; we have continued contraction of the substantial wound on the right posterior calf. We continue to have problems with the tip of the right fifth toe. We have been using Hydrofera Blue to both wound areas He is complaining about some discomfort in the Achilles area. Tells me he had surgery for what sounds like a torn Achilles about 10 years ago. 4/12; we have continued contraction of the substantial wound on the right posterior calf. Still an area on the tip of the right fifth toe. I changed him to silver collagen on the toe last week but I think home health continue to use Hydrofera Blue to both wound areas. Electronic Signature(s) Signed: 01/01/2020 5:51:00 PM By: [name] MD Entered By: [name] on 12/31/2019 11:15:53 -------------------------------------------------------------------------------- Physical Exam Details Patient Name: Date of Service: ASRIEL, WESTRUP 12/31/2019 10:30 AM Medical Record [id number] Patient Account Number: [id number] Date of Birth/Sex: Treating RN: 1946/08/26 (74 y.o. [name] Primary Care Provider: [name] Other Clinician: Referring Provider: Treating Provider/Extender:Chritopher Coster, [name], [name] in Treatment: 21 Constitutional Patient is hypertensive.. Pulse regular and within target range for patient.Marland Kitchen Respirations regular, non-labored and within target range.. Temperature is normal and within the target range for the patient.Marland Kitchen Appears in no distress. Cardiovascular . We have good edema control.. Notes Wound exam Right fifth toe  at the tip of the toe this is still open but not as necrotic as surface as last week. Tissue looks healthy no debridement was felt to be necessary On the right posterior calf which was his extensive original wound we continue to have a nice looking surface here. Continued contraction in the overall surface area. Electronic Signature(s) Signed: 01/01/2020 5:51:00 PM By: [name] MD Entered By: [name] on 12/31/2019 11:17:03 -------------------------------------------------------------------------------- Physician Orders  Details Patient Name: Date of Service: [name], [name] 12/31/2019 10:30 AM Medical Record [id number] Patient Account Number: [id number] Date of Birth/Sex: Treating RN: 1945/10/28 (74 y.o. [name] Primary Care Provider: [name] Other Clinician: Referring Provider: Treating Provider/Extender:Tambi Thole, [name], [name] in Treatment: 21 Verbal / Phone Orders: No Diagnosis Coding ICD-10 Coding Code Description S80.11XD Contusion of right lower leg, subsequent encounter L97.212 Non-pressure chronic ulcer of right calf with fat layer exposed I87.321 Chronic venous hypertension (idiopathic) with inflammation of right lower extremity E11.622 Type 2 diabetes mellitus with other skin ulcer L97.518 Non-pressure chronic ulcer of other part of right foot with other specified severity Follow-up Appointments Return Appointment in 1 week. Dressing Change Frequency Wound #3 Right,Posterior Lower Leg Other: - twice a week - wound clinic to change 1x a week on Monday, home health to change 1x a week on Thursday or Friday Wound #5 Right Toe Fifth Other: - twice a week - wound clinic to change 1x a week on Monday, home health to change 1x a week on Thursday or Friday Skin Barriers/Peri-Wound Care Wound #3 Right,Posterior Lower Leg Barrier cream Moisturizing lotion - to leg Wound Cleansing Clean wound with Normal Saline. -  or normal saline on days that dressing is changed May shower with protection. Primary Wound Dressing Wound #3 Right,Posterior Lower Leg Hydrofera Blue - Classic Wound #5 Right Toe Fifth Silver Collagen - moisten with hydrogel or KY jelly Secondary Dressing Wound #3 Right,Posterior Lower Leg Foam - foam top bend of foot for protection ABD pad Zetuvit or Kerramax - or Xtrasorb Wound #5 Right Toe Fifth Kerlix/Rolled Gauze Dry Gauze Other: - felt or rolled up gauze under toe to elevate tip of toe off of shoe Edema Control Unna Boot to Right Lower Extremity - *****ONLY ONE SINGLE LAYER OF UNNA BOOT***** - foam to achilles to pad Avoid standing for long periods of time Elevate legs to the level of the heart or above for 30 minutes daily and/or when sitting, a frequency of: - throughout the day Exercise regularly Additional Orders / Instructions Other: - Go to ER if toe/leg gets more red/black, increased pain, fever [facility] for [facility] care. - [facility] Electronic Signature(s) Signed: 12/31/2019 6:12:10 PM By: [name] RN, BSN Signed: 01/01/2020 5:51:00 PM By: [name] MD Entered By: [name] on 12/31/2019 10:50:40 -------------------------------------------------------------------------------- Problem List Details Patient Name: Date of Service: [name], [name] 12/31/2019 10:30 AM Medical Record [id number] Patient Account Number: [id number] Date of Birth/Sex: Treating RN: 15-May-1946 (73 y.o. [name] Primary Care Provider: [name] Other Clinician: Referring Provider: Treating Provider/Extender:Garmon Dehn, [name], [name] in Treatment: 21 Active Problems ICD-10 Evaluated Encounter Code Description Active Date Today Diagnosis S80.11XD Contusion of right lower leg, subsequent encounter 08/06/2019 No Yes L97.212 Non-pressure chronic ulcer of right calf with fat layer 08/06/2019 No  Yes exposed I87.321 Chronic venous hypertension (idiopathic) with 08/06/2019 No Yes inflammation of right lower extremity E11.622 Type 2 diabetes mellitus with other skin ulcer 08/06/2019 No Yes L97.518 Non-pressure chronic ulcer of other part of right foot 10/01/2019 No Yes with other specified severity Inactive Problems ICD-10 Code Description Active Date Inactive Date L97.811 Non-pressure chronic ulcer of other part of right lower leg 12/10/2019 12/10/2019 limited to breakdown of skin Resolved Problems Electronic Signature(s) Signed: 01/01/2020 5:51:00 PM By: [name] MD Entered By: [name] on 12/31/2019 11:14:49 -------------------------------------------------------------------------------- Progress Note Details Patient Name: Date of Service: [name] 12/31/2019 10:30 AM  Medical Record [id number] Patient Account Number: [id number] Date of Birth/Sex: Treating RN: Feb 07, 1946 (74 y.o. [name] Primary Care Provider: [name] Other Clinician: Referring Provider: Treating Provider/Extender:Laycie Schriner, [name], [name] in Treatment: 21 Subjective History of Present Illness (HPI) Mona HPI Description: 74 year old gentleman who was recently seen by his nephrologist [name], and noted to have a wound on his left lower extremity which was lacerated 2 months ago and now has reopened. The patient's left shin has a ulceration with some exudate but no evidence of infection and he was referred to [place] for further care as it was known that the patient has had some peripheral vascular disease in the past. Past medical history significant for chronic kidney disease, atrial fibrillation, diabetes mellitus,status post kidney transplant in 1983 and 2005, a week fistula graft placement, status post previous bowel surgery. 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 10/06/2015 -- x-ray of  the left tibia and fibula shows no evidence of osteomyelitis. The patient has also had Doppler studies of his extremity and is awaiting the appointment with the vascular surgeon. We have not yet received these reports. 10/13/2015 -- lower extremity venous duplex reflux evaluation shows reflux in the left common femoral vein, left saphenofemoral junction and the proximal greater saphenous vein extending to the proximal calf. There is also reflux in the left proximal to mid small saphenous vein. Arterial duplex studies done showed the resting ABI was not applicable due to tibial artery medial calcification. 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 The right TBI was 1 which is normal and the left ABI was 1 which is normal. Patient has otherwise been doing fine and has been compliant with his dressings. 10/20/2015 -- He was seen by [name] recently for a vascular opinion on 10/15/2015. His left lower extremity venous insufficiency duplex study revealed GSV reflux,SS vein reflux and deep venous reflux in the common femoral vein. His ABIs were non compressible and his TBI on the right was 1.01 and on the left was 0.80. He was asked to continue with the wound care with compressive therapy followed by EVLA of the left GS vein 3 months. He recommended 20-30 mm thigh-high compression stockings and the need for a three-month trial of this. The patient had an Unna boot applied at the vascular office but he could not tolerate this with a lot of pain and issues with his toes and hence came here on Friday for removal of this and we reapplied a 2 layer compression. 11/10/2015 -- patient still has not purchased his 20-30 mm thigh-high compression stockings as prescribed by [name]. Readmission: 08/08/18 on evaluation today patient presents for readmission concerning a new injury to  the left anterior lower extremity. He was previously seen in 2017 here in our clinic. He states that he has done fairly well since that point. Nonetheless he is having at this time some pain but states that he hit this on a table that fell over and actually struck his leg. This appears to have pulled back some of his skin which folded in on itself and is causing some difficulty as far as that is concerned. There does not appear to be any evidence of infection at this time. No fevers, chills, nausea, or vomiting noted at this time. He's been using dressings  on his own currently without complication. 08/15/18 on evaluation today patient actually appears to be doing somewhat better in regard to his wound of the lower [name] when compared to the first visit last week. I had to do a much more extensive debridement at that time it does appear that I'm gonna have to perform some debridement today but it does not look to be as extensive by any means. Nonetheless fortunately he does not show any signs of infection he does have discomfort at this site. I believe based on what I'm seeing currently he may benefit from Iodoflex to help keep the wound bed clean. Patient tolerated therapy without complication. Upon evaluation today the patient actually appears to be doing excellent in regard to his left lower extremity ulcer. This is much better than the previous two visits where he had a lot of necrotic tissue around the edge of the wound simply due to the fact that again there was a significant skin tear where the edge had been cleared away prior to reattaching and being able to heal appropriately. He seems to be doing much better at this point. 08/28/18 on evaluation today the patient's wound actually does appear to be showing signs of improvement. With that being said though he is improving he would likely note even greater improvement if we were able to sharply debride the wound. Nonetheless this caused him  to much discomfort he tells me. 09/04/18 on evaluation today patient actually appears to be showing signs of improvement in regard to his left lower extremity ulcer. He has been tolerating the dressing changes including the wrap although he tells me at this point that the burning does last for a couple of days even with just the Iodoflex. I was afraid that this may been part of the issue that he was having with discomfort. It does seem to be the case. Nonetheless he shows no signs of evidence of infection at this time which is good news. No fevers chills noted ADMISSION to [name] 10/05/2018 This is a patient who was cared for in 2017 and again in the fall of this year at our sister clinic and [facility]. He actually lives in [place] in [place]. We have been dealing with an apparent traumatic area on the left anterior tibial area. This is been present for the last several months. He was supposed to be using Iodoflex Kerlix and Coban however he was hospitalized from 09/05/2018 through 09/11/2018 with delirium secondary to pneumonia. Since then he is only been putting Vaseline gauze on this without compression. He also has a more recent skin tear on the dorsal right hand that may have only happened in the last week. The patient had arterial studies done in 2017 in January which was 3 years ago. At that point he had noncompressible ABIs but really quite good TBI's both normal. Triphasic waveforms on the right monophasic at the left posterior tibial but triphasic at the left dorsalis pedis. His ABIs in our clinic today were both noncompressible 1/23; the patient has wounds on the right dorsal hand just distal to the wrist and on the left anterior lower extremity. Both of these look very healthy he is using Hydrofera Blue 1/30; left anterior lower extremity wound much smaller. Healthy looking surface. The laceration area just distal to the wrist on the dorsal hand on the right is  also just about closed I used Hydrofera Blue here 2/6; left anterior lower extremity wound is much smaller but still  open. The laceration area just distal to the wrist on the dorsal hand is fully epithelialized. 2/13; the patient's anterior lower extremity wound is closed. The laceration just distal to the wrist on the dorsal hand is also fully epithelialized and closed. The patient has external compression stockings which I think are 20/30 READMISSION 08/06/2019 [name] is a 74 year old man with had several times previously in our clinic. He is a diabetic with a history of chronic renal insufficiency status post kidney transplant in 1983 and again in 2005. He was then in 2017 with a laceration on the left lower extremity. He was worked up at the time with arterial studies and reflux studies. The arterial studies showed ABIs to be noncompressible but TBI's were within normal limits. I do not have the reflux studies at the moment. He was also sent here in 2019 with a left lower extremity wound and then again in 2020 with left lower extremity trauma a skin tear on the wrist. He was discharged with 20/30 stockings identified from myself that that might not be enough compression. Nevertheless he states he was wearing these fairly reliably. In September he had a fall with a substantial bruise in the area of the wound. He says he saw orthopedics and they told him there was some muscle strain sometime it later this opened into a wound. He has a fairly substantial wound on the right posterior calf. Satellite areas around this including medially and posteriorly. He has not worn his stockings since the injury Past medical history; includes chronic renal failure secondary to diabetes with kidney transplant x2, atrial fibrillation, heart failure with preserved ejection fraction, coronary artery disease. ABIs on the right in our clinic were once again noncompressible 08/13/2019 on evaluation today  patient appears to be doing decently well with regard to his wound compared to last week's evaluation. Unfortunately he is still having a lot of discomfort at this point which is I think in some part due to the 3 layer compression wrap which is a little bit stronger I think for him. When he was here before we actually utilized a Kerlix and Coban wrap which he states seemed to got a little bit better. Nonetheless I think we can probably drop back to this in light of the discomfort that he had. Nonetheless the pain was not really right around the wound itself as much as it was around the ankle in particular. The Iodoflex does seem to have done well for him As the wound is appearing somewhat better today which is excellent news. 11/30; still complaining of a lot of pain. Apparently arterial studies I ordered 2 weeks ago are below. I do not believe we have an appointment with vein and vascular as of yet; ABI Findings: +---------+------------------+-----+----------+--------+ Right Rt Pressure (mmHg)IndexWaveform Comment  +---------+------------------+-----+----------+--------+ PTA >254 1.50 monophasic  +---------+------------------+-----+----------+--------+ DP >254 1.50 monophasic  +---------+------------------+-----+----------+--------+ Great Toe68 0.40 Abnormal   +---------+------------------+-----+----------+--------+ +---------+------------------+-----+----------+-------+ Left Lt Pressure (mmHg)IndexWaveform Comment +---------+------------------+-----+----------+-------+ Brachial 169     +---------+------------------+-----+----------+-------+ PTA >254 1.50 monophasic  +---------+------------------+-----+----------+-------+ DP >254 1.50 monophasic  +---------+------------------+-----+----------+-------+ Great Toe40 0.24 Abnormal   +---------+------------------+-----+----------+-------+ Pedal arteries appear hyperemic. Patient refused  Brachial pressure in the right arm. Summary: Right: Resting right ankle-brachial index indicates noncompressible right lower extremity arteries. The right toe- brachial index is abnormal. Left: Resting left ankle-brachial index indicates noncompressible left lower extremity arteries. The left toe-brachial index is abnormal. He is also having considerably more swelling in his left calf. This was not there when I last  saw him 2 weeks ago. He tells me that some of the home health compression wraps have been slipping down and that may be the issue here however a month I am uncertain 16/1; sees vascular on December 22. Still complaining of a lot of pain. DVT study I did last time was negative for DVT 12/14; still complaining of pain which if this is arterial is certainly claudication and rest keeps him uncomfortable at night. He has an appointment with vein and vascular on December 22. Wound surface is better using Iodoflex. Once the surface of this is satisfactory and we have exhausted the vascular route. Perhaps an advanced treatment option. He has a configuration of the venous ulceration although his arterial studies are not very good. The other issue is the patient has a transplanted kidney. This will make angiography difficult and challenging issue 12/21; still complaining of pain and drainage. We are using Iodoflex on the wound under compression. He sees Dr. Donnetta Hutching tomorrow to evaluate his noninvasive studies noted above. He has a transplanted kidney further complicating the options for angiography. 12/28; still using Iodoflex under compression. I have Dr. Luther Parody note from 12/22. he noted arterial studies revealing monophasic waveforms at the pedal vessels bilaterally and calcified vessels making the ABI unreliable. He did not comment on the reduced TBI's. He felt these were venous wounds based on the palpable dorsalis pedis pulse. 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 And they  arranged for formal venous duplex with reflux studies in the next several weeks. Follow-up with either Scot Dock or Dr. Oneida Alar 09/24/2019 still using Iodoflex under compression. He has an appointment with Dr. Doren Custard on 1/7 with regards to his venous disease. The patient was not felt to have a primary arterial problem for the nonhealing of his wound. We did attempt to wrap him with 3 layer when he first came into the clinic he complained of a lot of pain in the ankle although this may have been because the dressing fell down somewhat. He has far too edema fluid in the right leg for a good prognosis about healing this wound He comes in today with an excoriation on the bottom part of his right fifth toe. He thinks he may have done this taking off his clothes and that something got caught on the toe. There is no open wound however the toe itself is very painful 1/11; we are using Iodoflex under compression. The wound bed is clean. He went on to see Dr. Doren Custard on 09/27/2019 he again feels that the patient's arterial supply is adequate. He feels that he might benefit from right greater saphenous vein ablation for a venous ulcer. In the meantime the area that he was complaining about last week on the right fifth toe. An x-ray that I ordered showed marked soft tissue swelling along the distal aspect of the right foot but there was no evidence of osteomyelitis. He comes in today with the fifth toenail just about coming off. He has black eschar underneath the toe on the plantar aspect. The toe was swollen red and very painful. In the right setting this could be a significant soft tissue infection versus ischemia of the toe itself. He did not show this to Dr. Doren Custard 1/18; I am using Iodoflex under compression a large wound on the right posterior calf. He had his right greater saphenous vein ablation by Dr. Doren Custard although I am not sure that is the only problem here. ooThe right fifth toe which was possibly trauma  2  weeks ago continues to be exceptionally painful with a necrotic tip. Maybe not quite as swollen. I started him empirically on Augmentin 5/125 1 p.o. twice daily last week after discussing this with Dr. Loletha Grayer of nephrology. Perhaps somewhat better this week but not as good as I was hoping. A plain x-ray was negative. He comes in today with an area on the medial right calf that was blistered and now open. In looking at things he appears to be systemically fluid volume overloaded. He has a transplanted kidney. He states he takes his Lasix variably when he has appointments he tries not to take it the later I am really not certain if he takes this reliably. However he has far too much edema fluid in the right leg to easily heal this wound and he appears to be developing blisters medially to form additional wounds 1/25; his CO2 angiogram was done by Dr. Doren Custard last week. This showed the proximal arteries all to be patent. On the right side the common femoral deep femoral superficial femoral popliteal anterior tibial and peroneal arteries were patent. The posterior tibial was occluded but reconstituted distally via collaterals from the peroneal artery he was felt he should have enough blood flow to heal his wounds including the right fifth toe. The right fifth toe looks better however he is still complaining of a lot of pain. The large area which is a venous ulceration posteriorly has a better looking surface I think we can switch to Hydrofera Blue today 2/1; the patient's original wounds on the right posterior medial calf has come down in width however superior to this he has new denuded areas and I am concerned we simply do not have enough edema control. He has already undergone a right greater saphenous ablation. He had a CO2 angiogram done by Dr. Doren Custard and the comment was that we should have enough blood flow to heal the venous wound however we simply do not seem to have enough edema control with 3 layer  compression. The patient is status post kidney transplant although his exact renal function is not really clear. Nor am i sure what dose of diuretic he is supposed to be on. He also has the area on the right fifth toe which was unclear etiology but I think became secondarily infected I gave him 2 weeks of antibiotics for this and this seems to have settled down he still has a black eschar over the tip of the toe. X-ray was negative for fracture. He says he has a history of gout. 2/8; the patient's wound on the right posterior calf is about the same. The superficial area medially also about the same. He got a prescription for prednisoneo Gout after he developed erythema on the dorsal aspect of his left great toe going along with the right fifth toe which has been problematic all along. He has not taken it because he is concerned about increasing CBGs. He has a transplanted kidney is already on prednisone 5 mg. He would not be a good candidate for NSAIDs. Perhaps colchicine. He is not aware of what his uric acid level is 2/15; his right posterior calf wound seems to be coming in in terms of width. Everything here looks fairly good. No mechanical debridement we have been using Hydrofera Blue. He has had an ablation by vein and vascular. Felt to have adequate arterial supply to this area. The patient got prednisone last week from Dr. Angelique Holm of nephrology. At my urging he actually  took it. The area on his left toe was a lot better. The right toe was not as painful but still erythematous with a wound at the tip. We have been using silver alginate here 2/22; right posterior calf seems to be gradually epithelializing. Still a fairly substantial wound. He still has an area on the tip of his right fifth toe which I think was gout related. This is gradually improving. We have been using Unna boots to wrap X-ray I did of the foot last time was again negative there was soft tissue irregularity about the  distal fifth digit but no radiographic evidence of bone damage 3/1; right posterior calf seems to be gradually improving however there were areas of hyper granulation. We have been using Hydrofera Blue. The hyper granulation is mostly evident in the most superior finger shape projection of the wound itself. ooThe area on the right fifth toe also was slough covered and required debridement. 3/8 continued improvement in the right posterior calf and the tip of the right fifth toe. We have been using Hydrofera Blue under compression he is changing the area to the toe 3/22; continued improvement in the right posterior calf. Unfortunately comes in today with a new skin tear in the mid anterior tibia area this probably had something to do with changing his dressing home health I called and left this a message last week. 3/29; continued improvement in the a substantial wound on the right posterior calf. The skin tear anteriorly from last week is already healed on the tip of the right fifth toe there is still a nonviable area. 4/5; we have continued contraction of the substantial wound on the right posterior calf. We continue to have problems with the tip of the right fifth toe. We have been using Hydrofera Blue to both wound areas He is complaining about some discomfort in the Achilles area. Tells me he had surgery for what sounds like a torn Achilles about 10 years ago. 4/12; we have continued contraction of the substantial wound on the right posterior calf. ooStill an area on the tip of the right fifth toe. I changed him to silver collagen on the toe last week but I think home health continue to use Hydrofera Blue to both wound areas. Objective Constitutional Patient is hypertensive.. Pulse regular and within target range for patient.Marland Kitchen Respirations regular, non-labored and within target range.. Temperature is normal and within the target range for the patient.Marland Kitchen Appears in no distress. Vitals Time  Taken: 10:32 AM, Height: 67 in, Source: Stated, Weight: 190 lbs, Source: Stated, BMI: 29.8, Pulse: 53 bpm, Respiratory Rate: 18 breaths/min, Blood Pressure: 168/76 mmHg, Capillary Blood Glucose: 101 mg/dl. General Notes: glucose per pt report this am Cardiovascular We have good edema control.. General Notes: Wound exam ooRight fifth toe at the tip of the toe this is still open but not as necrotic as surface as last week. Tissue looks healthy no debridement was felt to be necessary ooOn the right posterior calf which was his extensive original wound we continue to have a nice looking surface here. Continued contraction in the overall surface area. Integumentary (Hair, Skin) Wound #3 status is Open. Original cause of wound was Trauma. The wound is located on the Right,Posterior Lower Leg. The wound measures 8.5cm length x 4cm width x 0.1cm depth; 26.704cm^2 area and 2.67cm^3 volume. There is Fat Layer (Subcutaneous Tissue) Exposed exposed. There is no tunneling or undermining noted. There is a medium amount of serosanguineous drainage noted. The wound margin  is flat and intact. There is large (67-100%) red granulation within the wound bed. There is no necrotic tissue within the wound bed. Wound #5 status is Open. Original cause of wound was Blister. The wound is located on the Right Toe Fifth. The wound measures 0.4cm length x 0.4cm width x 0.1cm depth; 0.126cm^2 area and 0.013cm^3 volume. There is Fat Layer (Subcutaneous Tissue) Exposed exposed. There is no tunneling or undermining noted. There is a small amount of serous drainage noted. The wound margin is flat and intact. There is no granulation within the wound bed. There is a large (67-100%) amount of necrotic tissue within the wound bed including Adherent Slough. 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 There was a Haematologist Compression Therapy Procedure by [name], RN. Post procedure Diagnosis Wound #3: Same as Pre-Procedure Plan Follow-up Appointments: Return Appointment in 1 week. Dressing Change Frequency: Wound #3 Right,Posterior Lower Leg: Other: - twice a week - wound clinic to change 1x a week on Monday, home health to change 1x a week on Thursday or Friday Wound #5 Right Toe Fifth: Other: - twice a week - wound clinic to change 1x a week on Monday, home health to change 1x a week on Thursday or Friday Skin Barriers/Peri-Wound Care: Wound #3 Right,Posterior Lower Leg: Barrier cream Moisturizing lotion - to leg Wound Cleansing: Clean wound with Normal Saline. - or normal saline on days that dressing is changed May shower with protection. Primary Wound Dressing: Wound #3 Right,Posterior Lower Leg: Hydrofera Blue - Classic Wound #5 Right Toe Fifth: Silver Collagen - moisten with hydrogel or KY jelly Secondary Dressing: Wound #3 Right,Posterior Lower Leg: Foam - foam top bend of foot for protection ABD pad Zetuvit or Kerramax - or Xtrasorb Wound #5 Right Toe Fifth: Kerlix/Rolled Gauze Dry Gauze Other: - felt or rolled up gauze under toe to elevate tip of toe off of shoe Edema Control: Unna Boot to Right Lower Extremity - *****ONLY ONE SINGLE LAYER OF UNNA BOOT***** - foam to achilles to pad Avoid standing for long periods of time Elevate legs to the level of the heart or above for 30 minutes daily and/or when sitting, a frequency of: - throughout the day Exercise regularly Additional Orders / Instructions: Other: - Go to ER if toe/leg gets  more red/black, increased pain, fever Home Health: [facility] for wound care. - [facility] 1. I did not change the primary dressing which is Hydrofera Blue to the right posterior calf 2. Silver collagen to the right fifth toe. 3. Both wounds appear to be better this week. Certainly the major wound is contracting nicely with nice rims of epithelialization per Electronic Signature(s) Signed: 01/01/2020 5:51:00 PM By: [name] MD Entered By: [name] on 12/31/2019 11:18:15 -------------------------------------------------------------------------------- SuperBill Details Patient Name: Date of Service: KARRIEM, MUENCH 12/31/2019 Medical Record [id number] Patient Account Number: [id number] Date of Birth/Sex: Treating RN: 1946/07/28 (73 y.o. [name] Primary Care Provider: [name] Other Clinician: Referring Provider: Treating Provider/Extender:Karson Reede, [name], [name] in Treatment: 21 Diagnosis Coding ICD-10 Codes Code Description S80.11XD Contusion of right  lower leg, subsequent encounter L97.212 Non-pressure chronic ulcer of right calf with fat layer exposed I87.321 Chronic venous hypertension (idiopathic) with inflammation of right lower extremity E11.622 Type 2 diabetes mellitus with other skin ulcer L97.518 Non-pressure chronic ulcer of other part of right foot with other specified severity Facility Procedures CPT4 Code Description: [id number] (Facility Use Only) [phone] - APPLY Louretta Parma BOOT RT Modifier: Quantity: 1 Physician Procedures CPT4 Code Description: [id number] [id number] - WC PHYS LEVEL 3 - EST PT ICD-10 Diagnosis Description L97.212 Non-pressure chronic ulcer of right calf with fat layer ex I87.321 Chronic venous hypertension (idiopathic) with inflammation extremity L97.518  Non-pressure chronic ulcer of other part of right foot wit Modifier: posed of right lower h other specifie Quantity: 1 d  severity Electronic Signature(s) Signed: 01/01/2020 5:51:00 PM By: [name] MD Entered By: [name] on 12/31/2019 11:18:47

## 2020-01-01 NOTE — Progress Notes (Signed)
ADALBERT, ALBERTO ([id number]) Visit Report for 12/31/2019 Arrival Information Details Patient Name: Date of Service: [name], [name] 12/31/2019 10:30 AM Medical Record [id number] Patient Account Number: [id number] Date of Birth/Sex: Treating RN: [birth year] (74 y.o. [name], [name] Primary Care Amauri Medellin: [name] Other Clinician: Referring Rivky Clendenning: Treating Myleen Brailsford/Extender:Robson, [name], [name] in Treatment: 21 Visit Information History Since Last Visit Added or deleted any medications: No Patient Arrived: Ambulatory Any new allergies or adverse reactions: No Arrival Time: 10:30 Had a fall or experienced change in No Accompanied By: self activities of daily living that may affect Transfer Assistance: None risk of falls: Patient Identification Verified: Yes Signs or symptoms of abuse/neglect since last No Secondary Verification Process Yes visito Completed: Hospitalized since last visit: No Patient Requires Transmission-Based No Implantable device outside of the clinic excluding No Precautions: cellular tissue based products placed in the center Patient Has Alerts: Yes since last visit: Patient Alerts: non Has Dressing in Place as Prescribed: Yes compressable Has Compression in Place as Prescribed: Yes Pain Present Now: No Electronic Signature(s) Signed: 12/31/2019 5:39:59 PM By: [name] RN, BSN Entered By: [name] on 12/31/2019 10:32:43 -------------------------------------------------------------------------------- Compression Therapy Details Patient Name: Date of Service: HARROL, NOVELLO 12/31/2019 10:30 AM Medical Record [id number] Patient Account Number: [id number] Date of Birth/Sex: Treating RN: 02/17/1946 (73 y.o. [name] Primary Care Harlen Danford: [name] Other Clinician: Referring Jrake Rodriquez: Treating Fidel Caggiano/Extender:Robson, [name], [name] in Treatment:  21 Compression Therapy Performed for Wound Wound #3 Right,Posterior Lower Leg Assessment: Performed By: Clinician [name], RN Compression Type: Rolena Infante Post Procedure Diagnosis Same as Pre-procedure Electronic Signature(s) Signed: 12/31/2019 6:12:10 PM By: [name] RN, BSN Entered By: [name] on 12/31/2019 11:10:26 -------------------------------------------------------------------------------- Encounter Discharge Information Details Patient Name: Date of Service: BRENNYN, HAISLEY 12/31/2019 10:30 AM Medical Record [id number] Patient Account Number: [id number] Date of Birth/Sex: Treating RN: 1946-05-28 (73 y.o. [name] Primary Care Demita Tobia: [name] Other Clinician: Referring Cashtyn Pouliot: Treating Amariyah Bazar/Extender:Robson, [name], [name] in Treatment: 21 Encounter Discharge Information Items Discharge Condition: Stable Ambulatory Status: Ambulatory Discharge Destination: Home Transportation: Private Auto Accompanied By: self Schedule Follow-up Appointment: Yes Clinical Summary of Care: Electronic Signature(s) Signed: 12/31/2019 5:40:16 PM By: [name] Entered By: [name] on 12/31/2019 11:34:29 -------------------------------------------------------------------------------- Lower Extremity Assessment Details Patient Name: Date of Service: ADVAITH, LAMARQUE 12/31/2019 10:30 AM Medical Record [id number] Patient Account Number: [id number] Date of Birth/Sex: Treating RN: Oct 08, 1945 (74 y.o. [name] Primary Care Jon Kasparek: [name] Other Clinician: Referring Neilani Duffee: Treating Mickal Meno/Extender:Robson, [name], [name] in Treatment: 21 Edema Assessment Assessed: [Left: No] [Right: No] Edema: [Left: Ye] [Right: s] Calf Left: Right: Point of Measurement: 30 cm From Medial Instep cm 34 cm Ankle Left: Right: Point of Measurement: 10 cm From Medial Instep cm  23 cm Vascular Assessment Pulses: Dorsalis Pedis Palpable: [Right:Yes] Electronic Signature(s) Signed: 12/31/2019 5:39:59 PM By: [name] RN, BSN Entered By: [name] on 12/31/2019 10:37:31 -------------------------------------------------------------------------------- Multi Wound Chart Details Patient Name: Date of Service: ARAMIS, WEIL 12/31/2019 10:30 AM Medical Record [id number] Patient Account Number: [id number] Date of Birth/Sex: Treating RN: 23-Oct-1945 (73 y.o. [name] Primary Care Giani Winther: [name] Other Clinician: Referring Olof Marcil: Treating Davison Ohms/Extender:Robson, [name], [name] in Treatment: 21 Vital Signs Height(in): 67 Capillary Blood 101 Glucose(mg/dl): Weight(lbs): 190 Pulse(bpm): 69 Body Mass Index(BMI): 30 Blood Pressure(mmHg): 168/76 Temperature(F): Respiratory 18 Rate(breaths/min): Photos: [3:No Photos] [5:No Photos] [N/A:N/A] Wound Location: [3:Right, Posterior Lower Leg Right  Toe Fifth] [N/A:N/A] Wounding Event: [3:Trauma] [5:Blister] [N/A:N/A] 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] [5:Diabetic Wound/Ulcer of the N/A Lower Extremity] Comorbid History: [3:Cataracts, Anemia, Arrhythmia, Congestive Heart Failure, Hypertension, Heart Failure, Hypertension, Type II Diabetes, Gout, Neuropathy] [5:Cataracts, Anemia, Arrhythmia, Congestive Type II Diabetes, Gout, Neuropathy] [N/A:N/A] Date Acquired: [3:06/06/2019] [5:09/19/2019] [N/A:N/A] Weeks of Treatment: [3:21] [5:14] [N/A:N/A] Wound Status: [3:Open] [5:Open] [N/A:N/A] Measurements L x W x D 8.5x4x0.1 [5:0.4x0.4x0.1] [N/A:N/A] (cm) Area (cm) : [3:26.704] [5:0.126] [N/A:N/A] Volume (cm) : [3:2.67] [5:0.013] [N/A:N/A] % Reduction in Area: [3:-18.50%] [5:78.60%] [N/A:N/A] % Reduction in Volume: [3:60.50%] [5:78.00%] [N/A:N/A] Classification: [3:Full Thickness Without Exposed Support Structures] [5:Grade 2] [N/A:N/A] Exudate Amount:  [3:Medium] [5:Small] [N/A:N/A] Exudate Type: [3:Serosanguineous] [5:Serous] [N/A:N/A] Exudate Color: [3:red, brown] [5:amber] [N/A:N/A] Wound Margin: [3:Flat and Intact] [5:Flat and Intact] [N/A:N/A] Granulation Amount: [3:Large (67-100%)] [5:None Present (0%)] [N/A:N/A] Granulation Quality: [3:Red] [5:N/A] [N/A:N/A] Necrotic Amount: [3:None Present (0%)] [5:Large (67-100%)] [N/A:N/A] Exposed Structures: [3:Fat Layer (Subcutaneous Fat Layer (Subcutaneous Tissue) Exposed: Yes Fascia: No Tendon: No Muscle: No Joint: No Bone: No] [5:Tissue) Exposed: Yes Fascia: No Tendon: No Muscle: No Joint: No Bone: No] [N/A:N/A] Epithelialization: [3:Small (1-33%) Compression Therapy] [5:None N/A] [N/A:N/A N/A] Treatment Notes Electronic Signature(s) Signed: 12/31/2019 6:12:10 PM By: [name] RN, BSN Signed: 01/01/2020 5:51:00 PM By: [name] MD Entered By: [name] on 12/31/2019 11:15:06 -------------------------------------------------------------------------------- Multi-Disciplinary Care Plan Details Patient Name: Date of Service: MERVILLE, HIJAZI 12/31/2019 10:30 AM Medical Record [id number] Patient Account Number: [id number] Date of Birth/Sex: Treating RN: Jan 31, 1946 (73 y.o. [name] Primary Care Toua Stites: [name] Other Clinician: Referring Ethell Blatchford: Treating Dakota Stangl/Extender:Robson, [name], [name] in Treatment: 21 Active Inactive Wound/Skin Impairment Nursing Diagnoses: Impaired tissue integrity Goals: Patient/caregiver will verbalize understanding of skin care regimen Date Initiated: 08/06/2019 Target Resolution Date: 01/25/2020 Goal Status: Active Ulcer/skin breakdown will have a volume reduction of 30% by week 4 Date Initiated: 08/06/2019 Date Inactivated: 09/03/2019 Target Resolution Date Initiated: 08/06/2019 Date Inactivated: 09/03/2019 Date: 09/07/2019 Unmet Reason: PAD, Goal Status: Unmet necrotic  surface Interventions: Assess patient/caregiver ability to obtain necessary supplies Assess patient/caregiver ability to perform ulcer/skin care regimen upon admission and as needed Assess ulceration(s) every visit Provide education on ulcer and skin care Notes: Electronic Signature(s) Signed: 12/31/2019 6:12:10 PM By: [name] RN, BSN Entered By: [name] on 12/31/2019 10:50:53 -------------------------------------------------------------------------------- Pain Assessment Details Patient Name: Date of Service: SALEEM, COCCIA 12/31/2019 10:30 AM Medical Record [id number] Patient Account Number: [id number] Date of Birth/Sex: Treating RN: [birth year] (74 y.o. [name] Primary Care Rhilee Currin: [name] Other Clinician: Referring Izabellah Dadisman: Treating Aloma Boch/Extender:Robson, [name], [name] in Treatment: 21 Active Problems Location of Pain Severity and Description of Pain Patient Has Paino No Site Locations Rate the pain. Current Pain Level: 0 Pain Management and Medication Current Pain Management: Electronic Signature(s) Signed: 12/31/2019 5:39:59 PM By: [name] RN, BSN Entered By: [name] on 12/31/2019 10:33:49 -------------------------------------------------------------------------------- Patient/Caregiver Education Details Patient Name: [name] 4/12/2021andnbsp10:30 Date of Service: AM Medical Record [id number] Number: Patient Account Number: [id number] Treating RN: Date of Birth/Gender: [birth year] (73 y.o. [name]) Other Clinician: Primary Care Physician: Griffin Dakin Referring Physician: Physician/Extender: [name] in Treatment: 21 Education Assessment Education Provided To: Patient Education Topics Provided Wound/Skin Impairment: Methods: Explain/Verbal Responses: State content correctly [employer]) Signed: 12/31/2019 6:12:10 PM By: [name] RN, BSN Entered By: [name] on 12/31/2019 10:51:04 -------------------------------------------------------------------------------- Wound Assessment Details Patient Name: Date of Service: QUATAVIOUS, ROSSA 12/31/2019 10:30 AM  Medical Record [id number] Patient Account Number: [id number] Date of Birth/Sex: Treating RN: 09-19-1946 (74 y.o. [name] Primary Care Alandria Butkiewicz: [name] Other Clinician: Referring Machell Wirthlin: Treating Takira Sherrin/Extender:Robson, [name], [name] in Treatment: 21 Wound Status Wound Number: 3 Primary Venous Leg Ulcer Etiology: Wound Location: Right, Posterior Lower Leg Wound Open Wounding Event: Trauma Status: Date Acquired: 06/06/2019 Comorbid Cataracts, Anemia, Arrhythmia, Congestive Weeks Of Treatment: 21 History: Heart Failure, Hypertension, Type II Diabetes, Clustered Wound: No Gout, Neuropathy Wound Measurements Length: (cm) 8.5 % Reduct Width: (cm) 4 % Re Depth: (cm) 0.1 Epit Area: (cm) 26.704 Tun Volume: (cm) 2.67 Und Wound Description Full Thickness Without Exposed Support Classification: Structures Wound Flat and Intact Margin: Exudate Medium Amount: Exudate Serosanguineous Type: Exudate red, brown Color: Wound Bed Granulation Amount: Large (67-100%) Granulation Quality: Red Necrotic Amount: None Present (0%) Foul Odor After Cleansing: No Slough/Fibrino Yes Exposed Structure Fascia Exposed: No Fat Layer (Subcutaneous Tissue) Exposed: Yes Tendon Exposed: No Muscle Exposed: No Joint Exposed: No Bone Exposed: No ion in Area: -18.5% duction in Volume: 60.5% helialization: Small (1-33%) neling: No ermining: No Treatment Notes Wound #3 (Right, Posterior Lower Leg) 1. Cleanse With Wound Cleanser Soap and water 2. Periwound Care Moisturizing lotion TCA Cream 3. Primary Dressing Applied Hydrofera Blue 4.  Secondary Dressing ABD Pad Dry Gauze Kerramax/Xtrasorb 6. Support Layer [employer] Notes HB classic with normal saline. foam applied for protection at achilles area per patient. Electronic Signature(s) Signed: 12/31/2019 5:39:59 PM By: [name] RN, BSN Entered By: [name] on 12/31/2019 10:45:25 -------------------------------------------------------------------------------- Wound Assessment Details Patient Name: Date of Service: KYRE, JEFFRIES 12/31/2019 10:30 AM Medical Record [id number] Patient Account Number: [id number] Date of Birth/Sex: Treating RN: 1946-01-12 (73 y.o. [name] Primary Care Chaquita Basques: [name] Other Clinician: Referring Hisham Provence: Treating Jarret Torre/Extender:Robson, [name], [name] in Treatment: 21 Wound Status Wound Number: 5 Primary Diabetic Wound/Ulcer of the Lower Extremity Etiology: Wound Location: Right Toe Fifth Wound Open Wounding Event: Blister Status: Date Acquired: 09/19/2019 Comorbid Cataracts, Anemia, Arrhythmia, Congestive Weeks Of Treatment: 14 History: Heart Failure, Hypertension, Type II Diabetes, Clustered Wound: No Gout, Neuropathy Wound Measurements Length: (cm) 0.4 Width: (cm) 0.4 Depth: (cm) 0.1 Area: (cm) 0.126 Volume: (cm) 0.013 Wound Description Classification: Grade 2 Wound Margin: Flat and Intact Exudate Amount: Small Exudate Type: Serous Exudate Color: amber Wound Bed Granulation Amount: None Present (0%) Necrotic Amount: Large (67-100%) Necrotic Quality: Adherent Slough After Cleansing: No rino Yes Exposed Structure osed: No (Subcutaneous Tissue) Exposed: Yes osed: No osed: No sed: No ed: No % Reduction in Area: 78.6% % Reduction in Volume: 78% Epithelialization: None Tunneling: No Undermining: No Foul Odor Slough/Fib Fascia Exp Fat Layer Tendon Exp Muscle Exp Joint Expo Bone Expos Treatment Notes Wound #5 (Right Toe  Fifth) 1. Cleanse With Wound Cleanser Soap and water 3. Primary Dressing Applied Collegen AG Hydrogel or K-Y Jelly 4. Secondary Dressing Dry Gauze Roll Gauze 5. Secured With Medipore tape Tape Notes applied a rolled gauze under toe as directed. Electronic Signature(s) Signed: 12/31/2019 5:39:59 PM By: [name] RN, BSN Entered By: [name] on 12/31/2019 10:46:29 -------------------------------------------------------------------------------- [place] Details Patient Name: Date of Service: BURNEY, CALZADILLA 12/31/2019 10:30 AM Medical Record [id number] Patient Account Number: [id number] Date of Birth/Sex: Treating RN: 06/11/1946 (73 y.o. [name] Primary Care Lasean Gorniak: [name] Other Clinician: Referring Kloee Ballew: Treating Jaclene Bartelt/Extender:Robson, [name], [name] in Treatment: 21 Vital Signs Time Taken: 10:32 Pulse (bpm): 53 Height (in): 67 Respiratory  Rate (breaths/min): 18 Source: Stated Blood Pressure (mmHg): 168/76 Weight (lbs): 190 Capillary Blood Glucose (mg/dl): 101 Source: Stated Reference Range: 80 - 120 mg / dl Body Mass Index (BMI): 29.8 Notes glucose per pt report this am Electronic Signature(s) Signed: 12/31/2019 5:39:59 PM By: [name] RN, BSN Entered By: [name] on 12/31/2019 10:33:41

## 2020-01-02 NOTE — Progress Notes (Signed)
[name], [name] ([id number]) Visit Report for 10/29/2019 Arrival Information Details Patient Name: Date of Service: [name], [name] 10/29/2019 9:00 AM Medical Record [id number] Patient Account Number: [id number] Date of Birth/Sex: Treating RN: May 28, 1946 (74 y.o. M) Primary Care Lesia Monica: [name] Other Clinician: Referring Devonia Farro: Treating Kinta Martis/Extender:Robson, [name], [name] in Treatment: 12 Visit Information History Since Last Visit Added or deleted any medications: No Patient Arrived: Ambulatory Any new allergies or adverse reactions: No Arrival Time: 09:10 Had a fall or experienced change in No Accompanied By: self activities of daily living that may affect Transfer Assistance: None risk of falls: Patient Identification Verified: Yes Signs or symptoms of abuse/neglect since last No Secondary Verification Process Yes visito Completed: Hospitalized since last visit: No Patient Has Alerts: Yes Implantable device outside of the clinic excluding No Patient Alerts: non cellular tissue based products placed in the center compressable since last visit: Has Dressing in Place as Prescribed: Yes Pain Present Now: Yes Electronic Signature(s) Signed: 01/02/2020 9:23:26 AM By: [name] Entered By: [name] on 10/29/2019 09:10:28 -------------------------------------------------------------------------------- Compression Therapy Details Patient Name: Date of Service: [name], [name] 10/29/2019 9:00 AM Medical Record [id number] Patient Account Number: [id number] Date of Birth/Sex: Treating RN: 1946-03-13 (73 y.o. [name] Primary Care Maeli Spacek: [name] Other Clinician: Referring Andrew Soria: Treating Gene Glazebrook/Extender:Robson, [name], [name] in Treatment: 12 Compression Therapy Performed for Wound Wound #3 Right,Posterior Lower Leg Assessment: Performed By: Clinician [name], RN Compression Type: Rolena Infante Post Procedure Diagnosis Same as Pre-procedure Electronic Signature(s) Signed: 10/30/2019 5:30:42 PM By: [name] RN, BSN Entered By: [name] on 10/29/2019 10:13:04 -------------------------------------------------------------------------------- Compression Therapy Details Patient Name: Date of Service: [name], [name] 10/29/2019 9:00 AM Medical Record [id number] Patient Account Number: [id number] Date of Birth/Sex: Treating RN: 1945/09/27 (73 y.o. [name] Primary Care Billye Pickerel: [name] Other Clinician: Referring Saidi Santacroce: Treating Calea Hribar/Extender:Robson, [name], [name] in Treatment: 12 Compression Therapy Performed for Wound Wound #6 Right,Medial Lower Leg Assessment: Performed By: Clinician [name], RN Compression Type: Rolena Infante Post Procedure Diagnosis Same as Pre-procedure Electronic Signature(s) Signed: 10/30/2019 5:30:42 PM By: [name] RN, BSN Entered By: [name] on 10/29/2019 10:13:04 -------------------------------------------------------------------------------- Compression Therapy Details Patient Name: Date of Service: [name], [name] 10/29/2019 9:00 AM Medical Record [id number] Patient Account Number: [id number] Date of Birth/Sex: Treating RN: December 25, 1945 (73 y.o. [name] Primary Care Samaya Boardley: [name] Other Clinician: Referring Dorman Calderwood: Treating Zakariah Dejarnette/Extender:Robson, [name], [name] in Treatment: 12 Compression Therapy Performed for Wound Wound #7 Right,Distal,Posterior Lower Leg Assessment: Performed By: Clinician [name], RN Compression Type: Rolena Infante Post Procedure Diagnosis Same as Pre-procedure Electronic Signature(s) Signed: 10/30/2019 5:30:42 PM By: [name] RN, BSN Entered By: [name] on 10/29/2019  10:13:05 -------------------------------------------------------------------------------- Compression Therapy Details Patient Name: Date of Service: [name], [name] 10/29/2019 9:00 AM Medical Record [id number] Patient Account Number: [id number] Date of Birth/Sex: Treating RN: 08-01-1946 (73 y.o. [name] Primary Care Walter Min: [name] Other Clinician: Referring Josceline Chenard: Treating Chiyeko Ferre/Extender:Robson, [name], [name] in Treatment: 12 Compression Therapy Performed for Wound Wound #8 Right,Lateral Lower Leg Assessment: Performed By: Clinician [name], RN Compression Type: Rolena Infante Post Procedure Diagnosis Same as Pre-procedure Electronic Signature(s) Signed: 10/30/2019 5:30:42 PM By: [name] RN, BSN Entered By: [name] on 10/29/2019 10:13:05 -------------------------------------------------------------------------------- Encounter Discharge Information Details Patient Name: Date of Service: [name], [name] 10/29/2019 9:00 AM Medical Record [id number] Patient Account Number: [id number] Date of Birth/Sex: Treating RN: 04-Sep-1946 (  74 y.o. [name] Primary Care : [name] Other Clinician: Referring : Treating /Extender:Robson, [name], [name] in Treatment: 12 Encounter Discharge Information Items Discharge Condition: Stable Ambulatory Status: Ambulatory Discharge Destination: Home Transportation: Other Accompanied By: self Schedule Follow-up Appointment: Yes Clinical Summary of Care: Electronic Signature(s) Signed: 10/29/2019 5:47:02 PM By: [name] Entered By: [name] on 10/29/2019 10:26:31 -------------------------------------------------------------------------------- Lower Extremity Assessment Details Patient Name: Date of Service: [name], [name] 10/29/2019 9:00 AM Medical Record [id number] Patient Account Number:  [id number] Date of Birth/Sex: Treating RN: [birth year] (74 y.o. [name] Primary Care : [name] Other Clinician: Referring : Treating /Extender:Robson, [name], [name] in Treatment: 12 Edema Assessment Assessed: [Left: No] [Right: Yes] Edema: [Left: Ye] [Right: s] Calf Left: Right: Point of Measurement: 30 cm From Medial Instep cm 39 cm Ankle Left: Right: Point of Measurement: 10 cm From Medial Instep cm 26 cm Electronic Signature(s) Signed: 10/29/2019 9:45:43 AM By: [name] Entered By: [name] on 10/29/2019 09:37:08 -------------------------------------------------------------------------------- Multi Wound Chart Details Patient Name: Date of Service: [name], [name] 10/29/2019 9:00 AM Medical Record [id number] Patient Account Number: [id number] Date of Birth/Sex: Treating RN: 11-06-1945 (73 y.o. 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242] Volume (cm) : [3:25.635] [5:0.031] [6:1.924] % Reduction in Area: [3:-184.30%] [5:46.70%] [6:-66.70%] % Reduction in Volume: -279.10% [5:47.50%] [6:-66.60%] Classification: [3:Full Thickness Without Exposed Support Structures] [5:Grade 2] [6:Full Thickness Without Exposed Support Structures] Exudate Amount: [3:Large] [5:Small] [6:Medium] Exudate Type: [3:Purulent] [5:Serous] [6:Serous] Exudate Color: [3:yellow, brown, green] [5:amber] [6:amber] Wound Margin: [3:Thickened] [5:Distinct, outline attached Distinct, outline attached] Granulation Amount: [3:Large (67-100%)] [5:None Present (0%)] [6:Large (67-100%)] Granulation Quality: [3:Pink] [5:N/A] [6:Pink] Necrotic Amount: [3:Small (1-33%)] [5:Large (67-100%)] [6:None Present (0%)] Necrotic Tissue: [3:Adherent Slough] [5:Eschar, Adherent Slough N/A] Exposed Structures: [3:Fat Layer (Subcutaneous Fascia: No Tissue) Exposed: Yes Fascia: No Tendon: No Muscle: No Joint: No Bone: No] [5:Fat Layer (Subcutaneous Tissue) Exposed: Yes Tissue) Exposed: No Tendon: No Muscle: No Joint: No Bone: No] [6:Fat Layer (Subcutaneous  Fascia: No Tendon: No Muscle: No Joint: No Bone: No] Epithelialization: [3:Small (1-33%)] [5:None] [6:None] Procedures Performed: Compression Therapy [5:N/A 7 8] [6:Compression Therapy N/A] Photos: [3:No Photos] [5:No Photos] [6:N/A] Wound Location: [3:Right Lower Leg - Posterior, Right Lower Leg - Lateral N/A Distal] Wounding Event: [3:Blister] [5:Blister] [6:N/A] 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] [5:Venous Leg Ulcer] [6:N/A] Comorbid History: [3:Cataracts, Anemia, Arrhythmia, Congestive Heart Failure, Hypertension, Heart Failure, Hypertension, Type II Diabetes,  Gout, Neuropathy] [5:Cataracts, Anemia, Arrhythmia, Congestive Type II Diabetes, Gout, Neuropathy] [6:N/A] Date Acquired: [3:10/08/2019] [5:10/22/2019] [6:N/A] Weeks of Treatment: [3:3] [5:1] [6:N/A] Wound Status: [3:Open] [5:Open] [  6:N/A] Measurements L x W x D 0.7x2.5x0.1 [5:3x2.6x0.1] [6:N/A] (cm) Area (cm) : [3:1.374] [5:6.126] [6:N/A] Volume (cm) : [3:0.137] [5:0.613] [6:N/A] % Reduction in Area: [3:41.70%] [5:32.80%] [6:N/A] % Reduction in Volume: 41.90% [5:32.80%] [6:N/A] Classification: [3:Full Thickness Without Exposed Support StructuresExposed Support Structures] [5:Full Thickness Without] [6:N/A] Exudate Amount: [3:Medium] [5:Small] [6:N/A] Exudate Type: [3:Serous] [5:Serous] [6:N/A] Exudate Color: [3:amber] [5:amber] [6:N/A] Wound Margin: [3:Distinct, outline attached Distinct, outline attached N/A] Granulation Amount: [3:Large (67-100%)] [5:Large (67-100%)] [6:N/A] Granulation Quality: [3:Pink] [5:Pink] [6:N/A] Necrotic Amount: [3:None Present (0%)] [5:None Present (0%)] [6:N/A] Necrotic Tissue: [3:N/A] [5:N/A] [6:N/A] Exposed Structures: [3:Fat Layer (Subcutaneous Fat Layer (Subcutaneous N/A Tissue) Exposed: Yes Fascia: No Tendon: No Muscle: No Joint: No Bone: No] [5:Tissue) Exposed: Yes Fascia: No Tendon: No Muscle: No Joint: No Bone: No] Epithelialization: [3:None Compression Therapy] [5:None Compression Therapy] [6:N/A N/A] Treatment Notes Wound #3 (Right, Posterior Lower Leg) 1. Cleanse With Wound Cleanser Soap and water 2. Periwound Care Barrier cream Moisturizing lotion 3. Primary Dressing Applied Hydrofera Blue 4. Secondary Dressing ABD Pad Kerramax/Xtrasorb 6. Support Layer Production assistant, radio Notes hydrofera blue classic as primary. Wound #5 (Right Toe Fifth) 1. Cleanse With Wound Cleanser Soap and water 3. Primary Dressing Applied Calcium Alginate Ag 4. Secondary Dressing Dry Gauze 5. Secured With Medipore tape Wound #6 (Right, Medial Lower  Leg) 1. Cleanse With Wound Cleanser Soap and water 2. Periwound Care Barrier cream Moisturizing lotion 3. Primary Dressing Applied Calcium Alginate Ag 4. Secondary Dressing ABD Pad Kerramax/Xtrasorb 6. Support Layer [employer] 7. Footwear/Offloading device applied Surgical shoe Wound #7 (Right, Distal, Posterior Lower Leg) 1. Cleanse With Wound Cleanser Soap and water 2. Periwound Care Barrier cream Moisturizing lotion 3. Primary Dressing Applied Calcium Alginate Ag 4. Secondary Dressing ABD Pad Kerramax/Xtrasorb 6. Support Layer [employer] 7. Footwear/Offloading device applied Surgical shoe Wound #8 (Right, Lateral Lower Leg) 1. Cleanse With Wound Cleanser Soap and water 2. Periwound Care Barrier cream Moisturizing lotion 3. Primary Dressing Applied Calcium Alginate Ag 4. Secondary Dressing ABD Pad Kerramax/Xtrasorb 6. Support Layer [employer] 7. Footwear/Offloading device applied Surgical shoe Electronic Signature(s) Signed: 10/29/2019 6:01:37 PM By: [name] MD Entered By: [name] on 10/29/2019 10:40:26 -------------------------------------------------------------------------------- Multi-Disciplinary Care Plan Details Patient Name: Date of Service: [name], [name] 10/29/2019 9:00 AM Medical Record [id number] Patient Account Number: [id number] Date of Birth/Sex: Treating RN: 1946/05/06 (73 y.o. [name] Primary Care Aldon Hengst: [name] Other Clinician: Referring Choice Kleinsasser: Treating Hanny Elsberry/Extender:Robson, [name], [name] in Treatment: 12 Active Inactive Venous Leg Ulcer Nursing Diagnoses: Knowledge deficit related to disease process and management Goals: Patient will maintain optimal edema control Date Initiated: 08/06/2019 Target Resolution Date: 11/30/2019 Goal Status: Active Patient/caregiver will verbalize understanding of disease process and disease  management Date Initiated: 08/06/2019 Date Inactivated: 09/03/2019 Target Resolution Date: 09/07/2019 Goal Status: Met Interventions: Assess peripheral edema status every visit. Compression as ordered Provide education on venous insufficiency Notes: Wound/Skin Impairment Nursing Diagnoses: Impaired tissue integrity Goals: Patient/caregiver will verbalize understanding of skin care regimen Date Initiated: 08/06/2019 Target Resolution Date: 11/30/2019 Goal Status: Active Ulcer/skin breakdown will have a volume reduction of 30% by week 4 Date Initiated: 08/06/2019 Date Inactivated: 09/03/2019 Target Resolution Date: 09/07/2019 Unmet Reason: PAD, Goal Status: Unmet necrotic surface Interventions: Assess patient/caregiver ability to obtain necessary supplies Assess patient/caregiver ability to perform ulcer/skin care regimen upon admission and as needed Assess ulceration(s) every visit Provide education on ulcer and skin care Notes: Electronic Signature(s) Signed: 10/30/2019 5:30:42 PM By: Donnal Debar  Briant Cedar RN, BSN Entered By: [name] on 10/29/2019 09:55:57 -------------------------------------------------------------------------------- Pain Assessment Details Patient Name: Date of Service: [name], [name] 10/29/2019 9:00 AM Medical Record [id number] Patient Account Number: [id number] Date of Birth/Sex: Treating RN: [birth year] (74 y.o. M) Primary Care : [name] Other Clinician: Referring : Treating /Extender:Robson, [name], [name] in Treatment: 12 Active Problems Location of Pain Severity and Description of Pain Patient Has Paino Yes Site Locations Rate the pain. Current Pain Level: 3 Pain Management and Medication Current Pain Management: Electronic Signature(s) Signed: 01/02/2020 9:23:26 AM By: [name] Entered By: [name] on 10/29/2019  09:13:07 -------------------------------------------------------------------------------- Patient/Caregiver Education Details Patient Name: Date of Service: [name], [name] 2/8/2021andnbsp9:00 AM Medical Record [id number] Patient Account Number: [id number] Date of Birth/Gender: Treating RN: 27-Dec-1945 (74 y.o. [name] Primary Care Physician: [name] Other Clinician: Referring Physician: Treating Physician/Extender:Robson, [name], [name] in Treatment: 12 Education Assessment Education Provided To: Patient Education Topics Provided Wound/Skin Impairment: Methods: Explain/Verbal Responses: State content correctly [employer]) Signed: 10/30/2019 5:30:42 PM By: [name] RN, BSN Entered By: [name] on 10/29/2019 09:56:15 -------------------------------------------------------------------------------- Wound Assessment Details Patient Name: Date of Service: BEATRICE, SEHGAL 10/29/2019 9:00 AM Medical Record [id number] Patient Account Number: [id number] Date of Birth/Sex: Treating RN: 1946/01/11 (73 y.o. M) Primary Care : [name] Other Clinician: Referring : Treating /Extender:Robson, [name], [name] in Treatment: 12 Wound Status Wound Number: 3 Primary Venous Leg Ulcer Etiology: Wound Location: Right Lower Leg - Posterior Wound Open Wounding Event: Trauma Status: Date Acquired: 06/06/2019 Comorbid Cataracts, Anemia, Arrhythmia, Congestive Weeks Of Treatment: 12 History: Heart Failure, Hypertension, Type II Diabetes, Clustered Wound: No Gout, Neuropathy Photos Wound Measurements Length: (cm) 16 % Reduct Width: (cm) 5.1 % Reduct Depth: (cm) 0.4 Epitheli Area: (cm) 64.088 Tunneli Volume: (cm) 25.635 Undermi Wound Description Classification: Full Thickness Without Exposed Support Foul Odo Structures Slough/F Wound  Thickened Margin: Exudate Large Amount: Exudate Purulent Type: Exudate yellow, brown, green Color: Wound Bed Granulation Amount: Large (67-100%) Granulation Quality: Pink Fascia Ex Necrotic Amount: Small (1-33%) Fat Layer Necrotic Quality: Adherent Slough Tendon Ex Muscle Ex Joint Exp Bone Expo r After Cleansing: No ibrino Yes Exposed Structure posed: No (Subcutaneous Tissue) Exposed: Yes posed: No posed: No osed: No sed: No ion in Area: -184.3% ion in Volume: -279.1% alization: Small (1-33%) ng: No ning: No Electronic Signature(s) Signed: 10/31/2019 4:31:42 PM By: Mikeal Hawthorne EMT/HBOT Previous Signature: 10/29/2019 9:45:43 AM Version By: [name] Entered By: Mikeal Hawthorne on 10/31/2019 14:35:24 -------------------------------------------------------------------------------- Wound Assessment Details Patient Name: Date of Service: SUPREME, RYBARCZYK 10/29/2019 9:00 AM Medical Record [id number] Patient Account Number: [id number] Date of Birth/Sex: Treating RN: 29-Jul-1946 (74 y.o. M) Primary Care : [name] Other Clinician: Referring : Treating /Extender:Robson, [name], [name] in Treatment: 12 Wound Status Wound Number: 5 Primary Diabetic Wound/Ulcer of the Lower Extremity Etiology: Wound Location: Right Toe Fifth Wound Open Wounding Event: Blister Status: Date Acquired: 09/19/2019 Comorbid Cataracts, Anemia, Arrhythmia, Congestive Weeks Of Treatment: 5 History: Heart Failure, Hypertension, Type II Diabetes, Clustered Wound: No Gout, Neuropathy Photos Wound Measurements Length: (cm) 0.5 Width: (cm) 0.8 Depth: (cm) 0.1 Area: (cm) 0.314 Volume: (cm) 0.031 Wound Description Classification: Grade 2 Wound Margin: Distinct, outline attached Exudate Amount: Small Exudate Type: Serous Exudate Color: amber Wound Bed Granulation Amount: None Present (0%) Necrotic Amount: Large  (67-100%) Necrotic Quality: Eschar, Adherent Slough After Cleansing: No brino Yes Exposed Structure posed: No (Subcutaneous Tissue) Exposed: No posed:  No posed: No osed: No sed: No % Reduction in Area: 46.7% % Reduction in Volume: 47.5% Epithelialization: None Tunneling: No Undermining: No Foul Odor Slough/Fi Fascia Ex Fat Layer Tendon Ex Muscle Ex Joint Exp Bone Expo Electronic Signature(s) Signed: 10/31/2019 4:31:42 PM By: Mikeal Hawthorne EMT/HBOT Previous Signature: 10/29/2019 9:45:43 AM Version By: [name] Entered By: Mikeal Hawthorne on 10/31/2019 14:26:17 -------------------------------------------------------------------------------- Wound Assessment Details Patient Name: Date of Service: WILFERD, RITSON 10/29/2019 9:00 AM Medical Record [id number] Patient Account Number: [id number] Date of Birth/Sex: Treating RN: [birth year] (74 y.o. M) Primary Care Ada Holness: [name] Other Clinician: Referring Tulani Kidney: Treating Terriann Difonzo/Extender:Robson, [name], [name] in Treatment: 12 Wound Status Wound Number: 6 Primary Venous Leg Ulcer Etiology: Wound Location: Right Lower Leg - Medial Wound Open Wounding Event: Blister Status: Date Acquired: 10/08/2019 Comorbid Cataracts, Anemia, Arrhythmia, Congestive Weeks Of Treatment: 3 History: Heart Failure, Hypertension, Type II Diabetes, Clustered Wound: No Gout, Neuropathy Photos Wound Measurements Length: (cm) 7 % Reduct Width: (cm) 3.5 % Reduct Depth: (cm) 0.1 Epitheli Area: (cm) 19.242 Tunneli Volume: (cm) 1.924 Undermi Wound Description Classification: Full Thickness Without Exposed Support Foul Odo Structures Slough/F Wound Distinct, outline attached Margin: Exudate Medium Amount: Exudate Serous Type: Exudate amber Color: Wound Bed Granulation Amount: Large (67-100%) Granulation Quality: Pink Fascia E Necrotic Amount: None Present (0%) Fat Laye Tendon E Muscle  E Joint Ex Bone Exp r After Cleansing: No ibrino No Exposed Structure xposed: No r (Subcutaneous Tissue) Exposed: Yes xposed: No xposed: No posed: No osed: No ion in Area: -66.7% ion in Volume: -66.6% alization: None ng: No ning: No Electronic Signature(s) Signed: 10/31/2019 4:31:42 PM By: Mikeal Hawthorne EMT/HBOT Previous Signature: 10/29/2019 9:45:43 AM Version By: [name] Entered By: Mikeal Hawthorne on 10/31/2019 13:55:22 -------------------------------------------------------------------------------- Wound Assessment Details Patient Name: Date of Service: PONCIANO, SHEALY 10/29/2019 9:00 AM Medical Record [id number] Patient Account Number: [id number] Date of Birth/Sex: Treating RN: 07/30/1946 (74 y.o. M) Primary Care Cheryl Chay: [name] Other Clinician: Referring Amaar Oshita: Treating Jayziah Bankhead/Extender:Robson, [name], [name] in Treatment: 12 Wound Status Wound Number: 7 Primary Venous Leg Ulcer Etiology: Wound Location: Right Lower Leg - Posterior, Distal Wound Open Wounding Event: Blister Status: Date Acquired: 10/08/2019 Comorbid Cataracts, Anemia, Arrhythmia, Congestive Weeks Of Treatment: 3 History: Heart Failure, Hypertension, Type II Diabetes, Clustered Wound: No Gout, Neuropathy Photos Wound Measurements Length: (cm) 0.7 % Reduc Width: (cm) 2.5 % Reduc Depth: (cm) 0.1 Epithel Area: (cm) 1.374 Tunnel Volume: (cm) 0.137 Underm Wound Description Full Thickness Without Exposed Support Classification: Structures Wound Distinct, outline attached Margin: Exudate Medium Amount: Exudate Serous Type: Exudate amber Color: Wound Bed Granulation Amount: Large (67-100%) Granulation Quality: Pink Necrotic Amount: None Present (0%) Foul Odor After Cleansing: No Slough/Fibrino No Exposed Structure Fascia Exposed: No Fat Layer (Subcutaneous Tissue) Exposed: Yes Tendon Exposed: No Muscle Exposed: No Joint Exposed:  No Bone Exposed: No tion in Area: 41.7% tion in Volume: 41.9% ialization: None ing: No ining: No Electronic Signature(s) Signed: 10/31/2019 4:31:42 PM By: Mikeal Hawthorne EMT/HBOT Previous Signature: 10/29/2019 9:45:43 AM Version By: [name] Entered By: Mikeal Hawthorne on 10/31/2019 14:35:54 -------------------------------------------------------------------------------- Wound Assessment Details Patient Name: Date of Service: RASHIDI, LOH 10/29/2019 9:00 AM Medical Record [id number] Patient Account Number: [id number] Date of Birth/Sex: Treating RN: [birth year] (74 y.o. M) Primary Care Lelaina Oatis: [name] Other Clinician: Referring Betania Dizon: Treating Myliyah Rebuck/Extender:Robson, [name], [name] in Treatment: 12 Wound Status Wound Number: 8 Primary Venous Leg Ulcer Etiology: Wound Location: Right Lower Leg - Lateral  Wound Open Wounding Event: Blister Status: Date Acquired: 10/22/2019 Comorbid Cataracts, Anemia, Arrhythmia, Congestive Weeks Of Treatment: 1 History: Heart Failure, Hypertension, Type II Diabetes, Clustered Wound: No Gout, Neuropathy Photos Wound Measurements Length: (cm) 3 % Reduct Width: (cm) 2.6 % Reduct Depth: (cm) 0.1 Epitheli Area: (cm) 6.126 Tunneli Volume: (cm) 0.613 Undermi Wound Description Classification: Full Thickness Without Exposed Support Foul Odo Structures Slough/F Wound Distinct, outline attached Margin: Exudate Small Amount: Exudate Serous Type: Exudate amber Color: Wound Bed Granulation Amount: Large (67-100%) Granulation Quality: Pink Fascia E Necrotic Amount: None Present (0%) Fat Laye Tendon E Muscle E Joint Ex Bone Expo r After Cleansing: No ibrino No Exposed Structure xposed: No r (Subcutaneous Tissue) Exposed: Yes xposed: No xposed: No posed: No sed: No ion in Area: 32.8% ion in Volume: 32.8% alization: None ng: No ning: No Electronic Signature(s) Signed: 10/31/2019  4:31:42 PM By: Mikeal Hawthorne EMT/HBOT Previous Signature: 10/29/2019 9:45:43 AM Version By: [name] Entered By: Mikeal Hawthorne on 10/31/2019 14:25:55 -------------------------------------------------------------------------------- Vitals Details Patient Name: Date of Service: KRISTOFF, COONRADT 10/29/2019 9:00 AM Medical Record [id number] Patient Account Number: [id number] Date of Birth/Sex: Treating RN: 1945-11-17 (74 y.o. M) Primary Care Brittne Kawasaki: [name] Other Clinician: Referring Lailoni Baquera: Treating Eathon Valade/Extender:Robson, [name], [name] in Treatment: 12 Vital Signs Time Taken: 09:10 Temperature (F): 99.2 Height (in): 67 Pulse (bpm): 56 Weight (lbs): 190 Respiratory Rate (breaths/min): 19 Body Mass Index (BMI): 29.8 Blood Pressure (mmHg): 127/56 Capillary Blood Glucose (mg/dl): 73 Reference Range: 80 - 120 mg / dl Electronic Signature(s) Signed: 01/02/2020 9:23:26 AM By: [name] Entered By: [name] on 10/29/2019 09:13:01

## 2020-01-02 NOTE — Progress Notes (Signed)
[name], [name] ([id number]) Visit Report for 11/26/2019 Arrival Information Details Patient Name: Date of Service: [name], [name] 11/26/2019 9:45 AM Medical Record [id number] Patient Account Number: [id number] Date of Birth/Sex: Treating RN: August 28, 1946 (74 y.o. [name] Primary Care Letricia Krinsky: [name] Other Clinician: Referring Braylen Staller: Treating Nastashia Gallo/Extender:Robson, [name], [name] in Treatment: 16 Visit Information History Since Last Visit Added or deleted any medications: No Patient Arrived: Ambulatory Any new allergies or adverse reactions: No Arrival Time: 10:04 Had a fall or experienced change in No Accompanied By: self activities of daily living that may affect Transfer Assistance: None risk of falls: Patient Identification Verified: Yes Signs or symptoms of abuse/neglect since last No Secondary Verification Process Yes visito Completed: Hospitalized since last visit: No Patient Requires Transmission-Based No Implantable device outside of the clinic excluding No Precautions: cellular tissue based products placed in the center Patient Has Alerts: Yes since last visit: Patient Alerts: non Has Dressing in Place as Prescribed: Yes compressable Pain Present Now: No Electronic Signature(s) Signed: 01/02/2020 9:21:08 AM By: [name] Entered By: [name] on 11/26/2019 10:04:51 -------------------------------------------------------------------------------- Compression Therapy Details Patient Name: Date of Service: [name], [name] 11/26/2019 9:45 AM Medical Record [id number] Patient Account Number: [id number] Date of Birth/Sex: Treating RN: Jun 26, 1946 (73 y.o. [name] Primary Care Franky Reier: [name] Other Clinician: Referring Renne Platts: Treating Mykira Hofmeister/Extender:Robson, [name], [name] in Treatment: 16 Compression Therapy Performed for Wound Wound #3  Right,Posterior Lower Leg Assessment: Performed By: Clinician [name], RN Compression Type: Rolena Infante Post Procedure Diagnosis Same as Pre-procedure Electronic Signature(s) Signed: 11/26/2019 5:59:01 PM By: [name] RN, BSN Entered By: [name] on 11/26/2019 11:06:29 -------------------------------------------------------------------------------- Encounter Discharge Information Details Patient Name: Date of Service: [name], [name] 11/26/2019 9:45 AM Medical Record [id number] Patient Account Number: [id number] Date of Birth/Sex: Treating RN: Feb 13, 1946 (73 y.o. Oval Linsey Primary Care Jatavious Peppard: [name] Other Clinician: Referring Ayeza Therriault: Treating Shia Eber/Extender:Robson, [name], [name] in Treatment: 16 Encounter Discharge Information Items Discharge Condition: Stable Ambulatory Status: Ambulatory Discharge Destination: Home Transportation: Private Auto Accompanied By: self Schedule Follow-up Appointment: Yes Clinical Summary of Care: Patient Declined Electronic Signature(s) Signed: 11/28/2019 7:20:02 AM By: Carlene Coria RN Entered By: Carlene Coria on 11/26/2019 11:45:31 -------------------------------------------------------------------------------- Lower Extremity Assessment Details Patient Name: Date of Service: [name], [name] 11/26/2019 9:45 AM Medical Record [id number] Patient Account Number: [id number] Date of Birth/Sex: Treating RN: December 23, 1945 (74 y.o. [name] Primary Care Tarnesha Ulloa: [name] Other Clinician: Referring Andreyah Natividad: Treating Chalmer Zheng/Extender:Robson, [name], [name] in Treatment: 16 Edema Assessment Assessed: [Left: No] [Right: Yes] Edema: [Left: Ye] [Right: s] Calf Left: Right: Point of Measurement: 30 cm From Medial Instep cm 36 cm Ankle Left: Right: Point of Measurement: 10 cm From Medial Instep cm 24 cm Electronic Signature(s) Signed:  11/26/2019 1:02:56 PM By: [name] Entered By: [name] on 11/26/2019 10:22:10 -------------------------------------------------------------------------------- Multi Wound Chart Details Patient Name: Date of Service: [name], [name] 11/26/2019 9:45 AM Medical Record [id number] Patient Account Number: [id number] Date of Birth/Sex: Treating RN: 05-18-1946 (73 y.o. 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] Comorbid History: [3:Cataracts, Anemia, Arrhythmia, Congestive Heart Failure, Hypertension, Heart Failure, Hypertension, Type II Diabetes, Gout, Neuropathy] [5:Cataracts, Anemia, Arrhythmia, Congestive Type II Diabetes, Gout, Neuropathy] [N/A:N/A] Date Acquired: [3:06/06/2019] [5:09/19/2019] [N/A:N/A] Weeks of Treatment: [3:16] [5:9] [N/A:N/A] Wound Status: [3:Open] [5:Open] [N/A:N/A] Measurements L x W x D 15x4.5x0.2 [5:0.4x0.5x0.1] [N/A:N/A] (cm) Area (cm) : [3:53.014] [5:0.157] [N/A:N/A] Volume (cm) : [3:10.603] [5:0.016] [N/A:N/A] % Reduction in Area: [3:-135.20%] [5:73.30%] [N/A:N/A] % Reduction in Volume: -56.80% [5:72.90%] [N/A:N/A] Classification: [3:Full Thickness Without Exposed Support Structures] [5:Grade 2] [N/A:N/A] Exudate Amount: [3:Large] [5:Small] [N/A:N/A] Exudate Type: [3:Serosanguineous] [5:Serous] [N/A:N/A] Exudate Color: [3:red, brown]  [5:amber] [N/A:N/A] Wound Margin: [3:Thickened] [5:Distinct, outline attached] [N/A:N/A] Granulation Amount: [3:Large (67-100%)] [5:Small (1-33%)] [N/A:N/A] Granulation Quality: [3:Pink, Hyper-granulation] [5:Pink] [N/A:N/A] Necrotic Amount: [3:Small (1-33%)] [5:Large (67-100%)] [N/A:N/A] Exposed Structures: [3:Fat Layer (Subcutaneous Tissue) Exposed: Yes Fascia: No Tendon: No Muscle: No Joint: No Bone: No] [5:Fascia: No Fat Layer (Subcutaneous Tissue) Exposed: No Tendon: No Muscle: No Joint: No Bone: No] [N/A:N/A] Epithelialization: [3:Medium (34-66%) Compression Therapy] [5:Small (1-33%) N/A] [N/A:N/A N/A] Treatment Notes Wound #3 (Right, Posterior Lower Leg) 1. Cleanse With Wound Cleanser Soap and water 3. Primary Dressing Applied Hydrofera Blue 4. Secondary Dressing ABD Pad 6. Support Layer Applied Kerlix/[employer] Notes HB classic with normal saline Wound #5 (Right Toe Fifth) 1. Cleanse With Wound Cleanser Soap and water 3. Primary Dressing Applied Hydrofera Blue 4. Secondary Dressing Dry Gauze 5. Secured With Tape Notes HB classic with normal saline Electronic Signature(s) Signed: 11/26/2019 5:29:15 PM By: [name] MD Signed: 11/26/2019 5:59:01 PM By: [name] RN, BSN Entered By: [name] on 11/26/2019 11:34:45 -------------------------------------------------------------------------------- Multi-Disciplinary Care Plan Details Patient Name: Date of Service: [name], [name] 11/26/2019 9:45 AM Medical Record [id number] Patient Account Number: [id number] Date of Birth/Sex: Treating RN: 09-30-1945 (73 y.o. [name] Primary Care Yanelis Osika: [name] Other Clinician: Referring Patrycja Mumpower: Treating Leani Myron/Extender:Robson, [name], [name] in Treatment: 16 Active Inactive Wound/Skin Impairment Nursing Diagnoses: Impaired tissue integrity Goals: Patient/caregiver will verbalize understanding of skin  care regimen Date Initiated: 08/06/2019 Target Resolution Date: 12/28/2019 Goal Status: Active Ulcer/skin breakdown will have a volume reduction of 30% by week 4 Target Resolution Date Initiated: 08/06/2019 Date Inactivated: 09/03/2019 Date: 09/07/2019 Unmet Reason: PAD, Goal Status: Unmet necrotic surface Interventions: Assess patient/caregiver ability to obtain necessary supplies Assess patient/caregiver ability to perform ulcer/skin care regimen upon admission and as needed Assess ulceration(s) every visit Provide education on ulcer and skin care Notes: Electronic Signature(s) Signed: 11/26/2019 5:59:01 PM By: [name] RN, BSN Entered By: [name] on 11/26/2019 14:35:12 -------------------------------------------------------------------------------- Pain Assessment Details Patient Name: Date of Service: [name], [name] 11/26/2019 9:45 AM Medical Record [id number] Patient Account Number: [id number] Date of Birth/Sex: Treating RN: 21-May-1946 (74 y.o. [name] Primary Care Jackston Oaxaca: [name] Other Clinician: Referring Lisel Siegrist: Treating Briley Bumgarner/Extender:Robson, [name], [name] in Treatment: 16 Active Problems Location of Pain Severity and Description of Pain Patient Has Paino No Site Locations Pain Management and Medication Current Pain Management: Electronic Signature(s) Signed: 11/26/2019 5:59:01 PM By: [name] RN, BSN Signed: 01/02/2020 9:21:08 AM By: [name] Entered By: [name] on 11/26/2019 10:05:02 -------------------------------------------------------------------------------- Patient/Caregiver Education Details Patient Name: Date of Service: [name], [name] 3/8/2021andnbsp9:45 AM Medical Record [id number] Patient Account Number: [id number] Date of Birth/Gender: Treating RN: July 13, 1946 (74 y.o. [name] Primary Care Physician: [name] Other  Clinician: Referring Physician: Treating Physician/Extender:Robson, [name], [name] in Treatment: [phone]  Education Assessment Education Provided To: Patient Education Topics Provided Wound/Skin Impairment: Methods: Explain/Verbal Responses: State content correctly Electronic Signature(s) Signed: 11/26/2019 5:59:01 PM By: [name] RN, BSN Entered By: [name] on 11/26/2019 14:35:25 -------------------------------------------------------------------------------- Wound Assessment Details Patient Name: Date of Service: [name], [name] 11/26/2019 9:45 AM Medical Record [id number] Patient Account Number: [id number] Date of Birth/Sex: Treating RN: 1946-08-20 (74 y.o. [name] Primary Care Padraic Marinos: [name] Other Clinician: Referring Janeli Lewison: Treating Dannetta Lekas/Extender:Robson, [name], [name] in Treatment: 16 Wound Status Wound Number: 3 Primary Venous Leg Ulcer Etiology: Wound Location: Right Lower Leg - Posterior Wound Open Wounding Event: Trauma Status: Date Acquired: 06/06/2019 Comorbid Cataracts, Anemia, Arrhythmia, Congestive Weeks Of Treatment: 16 History: Heart Failure, Hypertension, Type II Diabetes, Clustered Wound: No Gout, Neuropathy Photos Wound Measurements Length: (cm) 15 % Reduct Width: (cm) 4.5 % Reduct Depth: (cm) 0.2 Epitheli Area: (cm) 53.014 Tunneli Volume: (cm) 10.603 Undermi Wound Description Classification: Full Thickness Without Exposed Support Foul Odo Structures Slough/F Wound Thickened Margin: Exudate Large Amount: Exudate Serosanguineous Type: Exudate red, brown Color: Wound Bed Granulation Amount: Large (67-100%) Granulation Quality: Pink, Hyper-granulation Fascia E Necrotic Amount: Small (1-33%) Fat Laye Necrotic Quality: Adherent Slough Tendon E Muscle E Joint Ex Bone Expos r After Cleansing: No ibrino No Exposed Structure xposed: No r (Subcutaneous  Tissue) Exposed: Yes xposed: No xposed: No posed: No ed: No ion in Area: -135.2% ion in Volume: -56.8% alization: Medium (34-66%) ng: No ning: No Electronic Signature(s) Signed: 11/27/2019 3:29:05 PM By: Mikeal Hawthorne EMT/HBOT Signed: 01/02/2020 9:04:28 AM By: [name] RN, BSN Previous Signature: 11/26/2019 1:02:56 PM Version By: [name] Previous Signature: 11/26/2019 5:59:01 PM Version By: [name] RN, BSN Entered By: Mikeal Hawthorne on 11/27/2019 13:51:16 -------------------------------------------------------------------------------- Wound Assessment Details Patient Name: Date of Service: [name], [name] 11/26/2019 9:45 AM Medical Record [id number] Patient Account Number: [id number] Date of Birth/Sex: Treating RN: 01-Feb-1946 (74 y.o. [name] Primary Care Charlise Giovanetti: [name] Other Clinician: Referring Aris Moman: Treating Danielys Madry/Extender:Robson, [name], [name] in Treatment: 16 Wound Status Wound Number: 5 Primary Diabetic Wound/Ulcer of the Lower Extremity Etiology: Wound Location: Right Toe Fifth Wound Open Wounding Event: Blister Status: Date Acquired: 09/19/2019 Comorbid Cataracts, Anemia, Arrhythmia, Congestive Weeks Of Treatment: 9 History: Heart Failure, Hypertension, Type II Diabetes, Clustered Wound: No Gout, Neuropathy Photos Wound Measurements Length: (cm) 0.4 Width: (cm) 0.5 Depth: (cm) 0.1 Area: (cm) 0.157 Volume: (cm) 0.016 Wound Description Classification: Grade 2 Wound Margin: Distinct, outline attached Exudate Amount: Small Exudate Type: Serous Exudate Color: amber Wound Bed Granulation Amount: Small (1-33%) Granulation Quality: Pink Necrotic Amount: Large (67-100%) Necrotic Quality: Adherent Slough After Cleansing: No rino Yes Exposed Structure osed: No (Subcutaneous Tissue) Exposed: No osed: No osed: No sed: No ed: No % Reduction in Area: 73.3% % Reduction in Volume:  72.9% Epithelialization: Small (1-33%) Tunneling: No Undermining: No Foul Odor Slough/Fib Fascia Exp Fat Layer Tendon Exp Muscle Exp Joint Expo Bone Expos Electronic Signature(s) Signed: 11/27/2019 3:29:05 PM By: Mikeal Hawthorne EMT/HBOT Signed: 01/02/2020 9:04:28 AM By: [name] RN, BSN Previous Signature: 11/26/2019 1:02:56 PM Version By: [name] Previous Signature: 11/26/2019 5:59:01 PM Version By: [name] RN, BSN Entered By: Mikeal Hawthorne on 11/27/2019 13:50:49 -------------------------------------------------------------------------------- [place] Details Patient Name: Date of Service: [name], [name] 11/26/2019 9:45 AM Medical Record [id number] Patient Account Number: [id number] Date of Birth/Sex: Treating RN: Feb 11, 1946 (74 y.o. [name] Primary Care Nithya Meriweather: [name] Other Clinician: Referring Sahib Pella: Treating Daisie Haft/Extender:Robson, [name], Governor Rooks  Weeks in Treatment: 16 Vital Signs Time Taken: 10:05 Temperature (F): 98.1 Height (in): 67 Pulse (bpm): 52 Weight (lbs): 190 Respiratory Rate (breaths/min): 20 Body Mass Index (BMI): 29.8 Blood Pressure (mmHg): 131/63 Capillary Blood Glucose (mg/dl): 120 Reference Range: 80 - 120 mg / dl Electronic Signature(s) Signed: 01/02/2020 9:21:08 AM By: [name] Entered By: [name] on 11/26/2019 10:06:40

## 2020-01-02 NOTE — Progress Notes (Signed)
SEYMOUR, PAVLAK ([id number]) Visit Report for 11/12/2019 Arrival Information Details Patient Name: Date of Service: DRAKO, MAESE 11/12/2019 2:00 PM Medical Record [id number] Patient Account Number: [id number] Date of Birth/Sex: Treating RN: 12/20/1945 (74 y.o. [name] Primary Care Gennifer Potenza: [name] Other Clinician: Referring Byron Peacock: Treating Sahaj Bona/Extender:Robson, [name], [name] in Treatment: 14 Visit Information History Since Last Visit Added or deleted any medications: No Patient Arrived: Ambulatory Any new allergies or adverse reactions: No Arrival Time: 14:03 Had a fall or experienced change in No Accompanied By: self activities of daily living that may affect Transfer Assistance: None risk of falls: Patient Identification Verified: Yes Signs or symptoms of abuse/neglect since last No Secondary Verification Process Yes visito Completed: Hospitalized since last visit: No Patient Requires Transmission-Based No Implantable device outside of the clinic excluding No Precautions: cellular tissue based products placed in the center Patient Has Alerts: Yes since last visit: Patient Alerts: non Has Dressing in Place as Prescribed: Yes compressable Pain Present Now: No Electronic Signature(s) Signed: 01/02/2020 9:23:26 AM By: [name] Entered By: [name] on 11/12/2019 14:03:43 -------------------------------------------------------------------------------- Complex / Palliative Patient Assessment Details Patient Name: Date of Service: HOBART, MARTE 11/12/2019 2:00 PM Medical Record [id number] Patient Account Number: [id number] Date of Birth/Sex: Treating RN: [birth year] (74 y.o. [name] Primary Care Laterrica Libman: [name] Other Clinician: Referring Samah Lapiana: Treating Shyne Lehrke/Extender:Robson, [name], [name] in Treatment: 14 Palliative Management  Criteria Complex Wound Management Criteria Patient has remarkable or complex co-morbidities requiring medications or treatments that extend wound healing times. Examples: Diabetes mellitus with chronic renal failure or end stage renal disease requiring dialysis Advanced or poorly controlled rheumatoid arthritis Diabetes mellitus and end stage chronic obstructive pulmonary disease Active cancer with current chemo- or radiation therapy Type 2 Diabetes, CHF, CAD, A-Fib, Immunosuppressed due to Kidney transplant Care Approach Wound Care Plan: Complex Wound Management Electronic Signature(s) Signed: 11/16/2019 5:26:28 PM By: [name] MD Signed: 01/02/2020 9:06:23 AM By: [name] RN, BSN Entered By: [name] on 11/16/2019 11:17:48 -------------------------------------------------------------------------------- Compression Therapy Details Patient Name: Date of Service: JUANITA, DEVINCENT 11/12/2019 2:00 PM Medical Record [id number] Patient Account Number: [id number] Date of Birth/Sex: Treating RN: 09-Jul-1946 (74 y.o. [name] Primary Care Rendy Lazard: [name] Other Clinician: Referring Eevee Borbon: Treating Bradie Sangiovanni/Extender:Robson, [name], [name] in Treatment: 14 Compression Therapy Performed for Wound Wound #3 Right,Posterior Lower Leg Assessment: Performed By: Clinician [name], RN Compression Type: Rolena Infante Post Procedure Diagnosis Same as Pre-procedure Electronic Signature(s) Signed: 11/15/2019 8:56:01 AM By: [name] RN, BSN Entered By: [name] on 11/12/2019 14:59:44 -------------------------------------------------------------------------------- Compression Therapy Details Patient Name: Date of Service: KIRILL, CHATTERJEE 11/12/2019 2:00 PM Medical Record [id number] Patient Account Number: [id number] Date of Birth/Sex: Treating RN: 27-Feb-1946 (73 y.o. [name] Primary Care Anterio Scheel:  [name] Other Clinician: Referring Joylene Wescott: Treating Hazley Dezeeuw/Extender:Robson, [name], [name] in Treatment: 14 Compression Therapy Performed for Wound Wound #6 Right,Medial Lower Leg Assessment: Performed By: Clinician [name], RN Compression Type: Rolena Infante Post Procedure Diagnosis Same as Pre-procedure Electronic Signature(s) Signed: 11/15/2019 8:56:01 AM By: [name] RN, BSN Entered By: [name] on 11/12/2019 14:59:45 -------------------------------------------------------------------------------- Encounter Discharge Information Details Patient Name: Date of Service: EPIMENIO, SCHETTER 11/12/2019 2:00 PM Medical Record [id number] Patient Account Number: [id number] Date of Birth/Sex: Treating RN: Sep 02, 1946 (73 y.o. [name] Primary Care Tran Arzuaga: [name] Other Clinician: Referring Ophelia Sipe: Treating Ogden Handlin/Extender:Robson, [name], [name] in Treatment: [phone] Encounter Discharge Information  Items Discharge Condition: Stable Ambulatory Status: Ambulatory Discharge Destination: Home Transportation: Private Auto Accompanied By: self Schedule Follow-up Appointment: Yes Clinical Summary of Care: Electronic Signature(s) Signed: 11/12/2019 5:30:41 PM By: [name] Entered By: [name] on 11/12/2019 15:09:33 -------------------------------------------------------------------------------- Lower Extremity Assessment Details Patient Name: Date of Service: LENARD, KAMPF 11/12/2019 2:00 PM Medical Record [id number] Patient Account Number: [id number] Date of Birth/Sex: Treating RN: May 30, 1946 (74 y.o. [name] Primary Care Brinley Rosete: [name] Other Clinician: Referring Deslyn Cavenaugh: Treating Ticara Waner/Extender:Robson, [name], [name] in Treatment: 14 Edema Assessment Assessed: [Left: No] [Right: Yes] Edema: [Left: Ye] [Right:  s] Calf Left: Right: Point of Measurement: 30 cm From Medial Instep cm 38 cm Ankle Left: Right: Point of Measurement: 10 cm From Medial Instep cm 23 cm Vascular Assessment Pulses: Dorsalis Pedis Palpable: [Right:Yes] Electronic Signature(s) Signed: 11/12/2019 5:30:41 PM By: [name] Entered By: [name] on 11/12/2019 14:10:18 -------------------------------------------------------------------------------- Multi Wound Chart Details Patient Name: Date of Service: KAVI, ALMQUIST 11/12/2019 2:00 PM Medical Record [id number] Patient Account Number: [id number] Date of Birth/Sex: Treating RN: 06-11-1946 (74 y.o. 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] [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] Comorbid History: [3:Cataracts, Anemia, Arrhythmia, Congestive Heart Failure, Hypertension, Heart Failure, Hypertension, Heart Failure, Hypertension, Type II Diabetes, Gout, Neuropathy] [5:Cataracts, Anemia, Arrhythmia, Congestive Type II Diabetes, Gout,  Neuropathy] [6:Cataracts, Anemia, Arrhythmia, Congestive Type II Diabetes, Gout, Neuropathy] Date Acquired: [3:06/06/2019] [5:09/19/2019] [6:10/08/2019] Weeks of Treatment: [3:14] [5:7] [6:5] Wound Status: [3:Open] [5:Open] [6:Open] Measurements L x W x D 15x4.6x0.3 [5:0.5x0.8x0.3] [6:1.2x0.3x0.1] (cm) Area (cm) : [3:54.192]  [5:0.314] [6:0.283] Volume (cm) : [3:16.258] [5:0.094] [6:0.028] % Reduction in Area: [3:-140.40%] [5:46.70%] [6:97.50%] % Reduction in Volume: [3:-140.40%] [5:-59.30%] [6:97.60%] Classification: [3:Full Thickness Without Exposed Support Structures] [5:Grade 2] [6:Full Thickness Without Exposed Support Structures] Exudate Amount: [3:Large] [5:Small] [6:Medium] Exudate Type: [3:Serosanguineous] [5:Serous] [6:Serous] Exudate Color: [3:red, brown] [5:amber] [6:amber] Wound Margin: [3:Thickened] [5:Distinct, outline attached] [6:Distinct, outline attached] Granulation Amount: [3:Large (67-100%)] [5:None Present (0%)] [6:Medium (34-66%)] Granulation Quality: [3:Pink] [5:N/A] [6:Pink] Necrotic Amount: [3:None Present (0%)] [5:Large (67-100%)] [6:Medium (34-66%)] Exposed Structures: [3:Fat Layer (Subcutaneous Fascia: No Tissue) Exposed: Yes Fascia: No Tendon: No Muscle: No Joint: No Bone: No] [5:Fat Layer (Subcutaneous Tissue) Exposed: No Tendon: No Muscle: No Joint: No Bone: No] [6:Fat Layer (Subcutaneous Tissue) Exposed: Yes  Fascia: No Tendon: No Muscle: No Joint: No Bone: No] Epithelialization: [3:Medium (34-66%)] [5:Small (1-33%)] [6:Large (67-100%)] Procedures Performed: [3:Compression Therapy] [5:N/A 7] [6:Compression Therapy N/A N/A] Photos: [3:No Photos] [5:N/A] [6:N/A] Wound Location: [3:Right, Distal, Posterior Lower Leg] [5:N/A] [6:N/A] Wounding Event: [3:Blister] [5:N/A] [6:N/A] 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] [5:N/A] [6:N/A] Comorbid History: [3:N/A] [5:N/A] [6:N/A] Date Acquired: [3:10/08/2019] [5:N/A] [6:N/A] Weeks of Treatment: [3:5] [5:N/A] [6:N/A] Wound Status: [3:Healed - Epithelialized] [5:N/A] [6:N/A] Measurements L x W x D 0x0x0 [5:N/A] [6:N/A] (cm) Area (cm) : [3:0] [5:N/A] [6:N/A] Volume (cm) : [3:0] [5:N/A] [6:N/A] % Reduction in Area: [3:100.00%] [5:N/A] [6:N/A] % Reduction in Volume: 100.00% [5:N/A] [6:N/A] Classification: [3:Full Thickness Without Exposed  Support Structures] [5:N/A] [6:N/A] Exudate Amount: [3:N/A] [5:N/A] [6:N/A] Exudate Type: [  3:N/A] [5:N/A] [6:N/A] Exudate Color: [3:N/A] [5:N/A] [6:N/A] Wound Margin: [3:N/A] [5:N/A] [6:N/A] Granulation Amount: [3:N/A] [5:N/A] [6:N/A] Granulation Quality: [3:N/A] [5:N/A] [6:N/A] Necrotic Amount: [3:N/A] [5:N/A] [6:N/A] Exposed Structures: [3:N/A] [5:N/A] [6:N/A] Epithelialization: [3:N/A] [5:N/A N/A] [6:N/A N/A] Treatment Notes Electronic Signature(s) Signed: 11/12/2019 5:55:43 PM By: [name] MD Signed: 11/15/2019 8:56:01 AM By: [name] RN, BSN Entered By: [name] on 11/12/2019 15:01:59 -------------------------------------------------------------------------------- Multi-Disciplinary Care Plan Details Patient Name: Date of Service: CASTER, FAYETTE 11/12/2019 2:00 PM Medical Record [id number] Patient Account Number: [id number] Date of Birth/Sex: Treating RN: [birth year] (74 y.o. [name] Primary Care Shawntrice Salle: [name] Other Clinician: Referring Jacayla Nordell: Treating Garwood Wentzell/Extender:Robson, [name], [name] in Treatment: 14 Active Inactive Venous Leg Ulcer Nursing Diagnoses: Knowledge deficit related to disease process and management Goals: Patient will maintain optimal edema control Date Initiated: 08/06/2019 Target Resolution Date: 11/30/2019 Goal Status: Active Patient/caregiver will verbalize understanding of disease process and disease management Date Initiated: 08/06/2019 Date Inactivated: 09/03/2019 Target Resolution Date: 09/07/2019 Goal Status: Met Interventions: Assess peripheral edema status every visit. Compression as ordered Provide education on venous insufficiency Notes: Wound/Skin Impairment Nursing Diagnoses: Impaired tissue integrity Goals: Patient/caregiver will verbalize understanding of skin care regimen Date Initiated: 08/06/2019 Target Resolution Date: 11/30/2019 Goal Status:  Active Ulcer/skin breakdown will have a volume reduction of 30% by week 4 Target Resolution Date Initiated: 08/06/2019 Date Inactivated: 09/03/2019 Date: 09/07/2019 Unmet Reason: PAD, Goal Status: Unmet necrotic surface Interventions: Assess patient/caregiver ability to obtain necessary supplies Assess patient/caregiver ability to perform ulcer/skin care regimen upon admission and as needed Assess ulceration(s) every visit Provide education on ulcer and skin care Notes: Electronic Signature(s) Signed: 11/15/2019 8:56:01 AM By: [name] RN, BSN Entered By: [name] on 11/12/2019 18:30:56 -------------------------------------------------------------------------------- Pain Assessment Details Patient Name: Date of Service: ESTON, HESLIN 11/12/2019 2:00 PM Medical Record [id number] Patient Account Number: [id number] Date of Birth/Sex: Treating RN: 1945/11/08 (74 y.o. [name] Primary Care Takota Cahalan: [name] Other Clinician: Referring Ravonda Brecheen: Treating Terissa Haffey/Extender:Robson, [name], [name] in Treatment: 14 Active Problems Location of Pain Severity and Description of Pain Patient Has Paino No Site Locations Pain Management and Medication Current Pain Management: Electronic Signature(s) Signed: 11/15/2019 8:56:01 AM By: [name] RN, BSN Signed: 01/02/2020 9:23:26 AM By: [name] Entered By: [name] on 11/12/2019 14:03:56 -------------------------------------------------------------------------------- Patient/Caregiver Education Details Patient Name: Date of Service: JAMEAL, RAZZANO 2/22/2021andnbsp2:00 PM Medical Record [id number] Patient Account Number: [id number] Date of Birth/Gender: Treating RN: 10-Aug-1946 (74 y.o. [name] Primary Care Physician: [name] Other Clinician: Referring Physician: Treating Physician/Extender:Robson, [name], [name] in Treatment: 14 Education Assessment Education Provided To: Patient Education Topics Provided Wound/Skin Impairment: Methods: Explain/Verbal Responses: State content correctly [employer]) Signed: 11/15/2019 8:56:01 AM By: [name] RN, BSN Entered By: [name] on 11/12/2019 18:31:06 -------------------------------------------------------------------------------- Wound Assessment Details Patient Name: Date of Service: TIYON, SANOR 11/12/2019 2:00 PM Medical Record [id number] Patient Account Number: [id number] Date of Birth/Sex: Treating RN: [birth year] (74 y.o. [name] Primary Care Diora Bellizzi: [name] Other Clinician: Referring Breniyah Romm: Treating Alanni Vader/Extender:Robson, [name], [name] in Treatment: 14 Wound Status Wound Number: 3 Primary Venous Leg Ulcer Etiology: Wound Location: Right Lower Leg - Posterior Wound Open Wounding Event: Trauma Status: Date Acquired: 06/06/2019 Comorbid Cataracts, Anemia, Arrhythmia, Congestive Weeks Of Treatment: 14 History: Heart Failure, Hypertension, Type II Diabetes, Clustered Wound: No Gout, Neuropathy Photos Wound Measurements Length: (cm) 15 % Reduct Width: (cm) 4.6 % Reduct Depth: (cm)  0.3 Epitheli Area: (cm) 54.192 Tunneli Volume: (cm) 16.258 Undermi Wound Description Full Thickness Without Exposed Support Foul Odo Classification: Structures Slough/F Wound Thickened Margin: Exudate Large Amount: Exudate Serosanguineous Type: Exudate red, brown Color: Wound Bed Granulation Amount: Large (67-100%) Granulation Quality: Pink Fascia E Necrotic Amount: None Present (0%) Fat Laye Tendon E Muscle E Joint Ex Bone Exp r After Cleansing: No ibrino No Exposed Structure xposed: No r (Subcutaneous Tissue) Exposed: Yes xposed: No xposed: No posed: No osed: No ion in Area: -140.4% ion in Volume: -140.4% alization: Medium (34-66%) ng:  No ning: No Electronic Signature(s) Signed: 11/13/2019 5:16:59 PM By: Mikeal Hawthorne EMT/HBOT Signed: 11/15/2019 8:56:01 AM By: [name] RN, BSN Previous Signature: 11/12/2019 5:30:41 PM Version By: [name] Entered By: Mikeal Hawthorne on 11/13/2019 14:17:18 -------------------------------------------------------------------------------- Wound Assessment Details Patient Name: Date of Service: DANEN, LAPAGLIA 11/12/2019 2:00 PM Medical Record [id number] Patient Account Number: [id number] Date of Birth/Sex: Treating RN: [birth year] (74 y.o. [name], Tammi Klippel Primary Care Everest Brod: [name] Other Clinician: Referring Feleica Fulmore: Treating Torryn Hudspeth/Extender:Robson, [name], [name] in Treatment: 14 Wound Status Wound Number: 5 Primary Diabetic Wound/Ulcer of the Lower Extremity Etiology: Wound Location: Right Toe Fifth Wound Open Wounding Event: Blister Status: Date Acquired: 09/19/2019 Comorbid Cataracts, Anemia, Arrhythmia, Congestive Weeks Of Treatment: 7 History: Heart Failure, Hypertension, Type II Diabetes, Clustered Wound: No Gout, Neuropathy Photos Wound Measurements Length: (cm) 0.5 % Reducti Width: (cm) 0.8 % Reducti Depth: (cm) 0.3 Epithelia Area: (cm) 0.314 Tunnelin Volume: (cm) 0.094 Undermin Wound Description Classification: Grade 2 Wound Margin: Distinct, outline attached Exudate Amount: Small Exudate Type: Serous Exudate Color: amber Wound Bed Granulation Amount: None Present (0%) Necrotic Amount: Large (67-100%) Necrotic Quality: Adherent Slough Foul Odor After Cleansing: No Slough/Fibrino Yes Exposed Structure Fascia Exposed: No Fat Layer (Subcutaneous Tissue) Exposed: No Tendon Exposed: No Muscle Exposed: No Joint Exposed: No Bone Exposed: No on in Area: 46.7% on in Volume: -59.3% lization: Small (1-33%) g: No ing: No Electronic Signature(s) Signed: 11/13/2019 5:16:59 PM By: Mikeal Hawthorne  EMT/HBOT Signed: 11/15/2019 6:07:18 PM By: [name] Previous Signature: 11/12/2019 5:30:41 PM Version By: [name] Entered By: Mikeal Hawthorne on 11/13/2019 14:15:37 -------------------------------------------------------------------------------- Wound Assessment Details Patient Name: Date of Service: NEEV, MCMAINS 11/12/2019 2:00 PM Medical Record [id number] Patient Account Number: [id number] Date of Birth/Sex: Treating RN: 1946/07/17 (74 y.o. [name] Primary Care Dante Cooter: [name] Other Clinician: Referring Hollee Fate: Treating Yaritzi Craun/Extender:Robson, [name], [name] in Treatment: 14 Wound Status Wound Number: 6 Primary Venous Leg Ulcer Etiology: Wound Location: Right Lower Leg - Medial Wound Open Wounding Event: Blister Wounding Event: Blister Status: Date Acquired: 10/08/2019 Comorbid Cataracts, Anemia, Arrhythmia, Congestive Weeks Of Treatment: 5 History: Heart Failure, Hypertension, Type II Diabetes, Clustered Wound: No Gout, Neuropathy Photos Wound Measurements Length: (cm) 1.2 % Reduct Width: (cm) 0.3 % Reduct Depth: (cm) 0.1 Epitheli Area: (cm) 0.283 Tunneli Volume: (cm) 0.028 Undermi Wound Description Full Thickness Without Exposed Support Foul Odo Classification: Structures Slough/F Wound Distinct, outline attached Margin: Exudate Medium Amount: Exudate Serous Type: Exudate amber Color: Wound Bed Granulation Amount: Medium (34-66%) Granulation Quality: Pink Fascia E Necrotic Amount: Medium (34-66%) Fat Laye Necrotic Quality: Adherent Slough Tendon E Muscle E Joint Ex Bone Exp r After Cleansing: No ibrino Yes Exposed Structure xposed: No r (Subcutaneous Tissue) Exposed: Yes xposed: No xposed: No posed: No osed: No ion in Area: 97.5% ion in Volume: 97.6% alization: Large (67-100%) ng: No ning: No Electronic Signature(s) Signed: 11/13/2019 5:16:59 PM By:  Mikeal Hawthorne  EMT/HBOT Signed: 11/15/2019 8:56:01 AM By: [name] RN, BSN Previous Signature: 11/12/2019 5:30:41 PM Version By: [name] Entered By: Mikeal Hawthorne on 11/13/2019 14:16:51 -------------------------------------------------------------------------------- Wound Assessment Details Patient Name: Date of Service: FAHD, GALEA 11/12/2019 2:00 PM Medical Record [id number] Patient Account Number: [id number] Date of Birth/Sex: Treating RN: 01-02-1946 (74 y.o. [name], Tammi Klippel Primary Care Quinnlyn Hearns: [name] Other Clinician: Referring Kaisei Gilbo: Treating Kei Langhorst/Extender:Robson, [name], [name] in Treatment: 14 Wound Status Wound Number: 7 Primary Venous Leg Ulcer Etiology: Wound Location: Right Lower Leg - Posterior, Distal Wound Healed - Epithelialized Wounding Event: Blister Status: Date Acquired: 10/08/2019 Comorbid Cataracts, Anemia, Arrhythmia, Congestive Weeks Of Treatment: 5 History: Heart Failure, Hypertension, Type II Diabetes, Clustered Wound: No Gout, Neuropathy Photos Wound Measurements Length: (cm) 0 % Reduc Width: (cm) 0 % Reduc Depth: (cm) 0 Epithel Area: (cm) 0 Volume: (cm) 0 Wound Description Classification: Full Thickness Without Exposed Support Structures Wound Distinct, outline attached Margin: Exudate Medium Amount: Exudate Serous Type: Exudate amber Color: Wound Bed Granulation Amount: Large (67-100%) Granulation Quality: Pink Necrotic Amount: None Present (0%) Foul Odor After Cleansing: No Slough/Fibrino No Exposed Structure Fascia Exposed: No Fat Layer (Subcutaneous Tissue) Exposed: Yes Tendon Exposed: No Muscle Exposed: No Joint Exposed: No Bone Exposed: No tion in Area: 100% tion in Volume: 100% ialization: None Electronic Signature(s) Signed: 11/13/2019 5:16:59 PM By: Mikeal Hawthorne EMT/HBOT Signed: 11/15/2019 6:07:18 PM By: [name] Previous Signature: 11/12/2019 5:30:41 PM  Version By: [name] Previous Signature: 11/12/2019 5:30:41 PM Version By: [name] Entered By: Mikeal Hawthorne on 11/13/2019 14:16:05 -------------------------------------------------------------------------------- Vitals Details Patient Name: Date of Service: SAYVON, ARTERBERRY 11/12/2019 2:00 PM Medical Record [id number] Patient Account Number: [id number] Date of Birth/Sex: Treating RN: 1946/04/01 (74 y.o. [name] Primary Care Julienne Vogler: [name] Other Clinician: Referring Onaje Warne: Treating Brantley Wiley/Extender:Robson, [name], [name] in Treatment: 14 Vital Signs Time Taken: 14:04 Temperature (F): 98.2 Height (in): 67 Pulse (bpm): 53 Weight (lbs): 190 Respiratory Rate (breaths/min): 20 Body Mass Index (BMI): 29.8 Blood Pressure (mmHg): 144/58 Capillary Blood Glucose (mg/dl): 97 Reference Range: 80 - 120 mg / dl Electronic Signature(s) Signed: 01/02/2020 9:23:26 AM By: [name] Entered By: [name] on 11/12/2019 14:04:27

## 2020-01-07 ENCOUNTER — Other Ambulatory Visit: Payer: Self-pay

## 2020-01-07 ENCOUNTER — Encounter (HOSPITAL_BASED_OUTPATIENT_CLINIC_OR_DEPARTMENT_OTHER): Payer: Medicare Other | Admitting: Internal Medicine

## 2020-01-07 DIAGNOSIS — E11621 Type 2 diabetes mellitus with foot ulcer: Secondary | ICD-10-CM | POA: Diagnosis not present

## 2020-01-07 NOTE — Progress Notes (Signed)
[name], [name] ([id number]) Visit Report for 01/07/2020 Arrival Information Details Patient Name: Date of Service: [name], [name] 01/07/2020 8:15 AM Medical Record [id number] Patient Account Number: [id number] Date of Birth/Sex: Treating RN: [birth year] (74 y.o. [name] Primary Care Chiann Goffredo: [name] Other Clinician: Referring Adel Burch: Treating Jaquay Posthumus/Extender:Robson, [name], [name] in Treatment: 33 Visit Information History Since Last Visit Added or deleted any medications: No Patient Arrived: Ambulatory Any new allergies or adverse reactions: No Arrival Time: 08:09 Had a fall or experienced change in No Accompanied By: self activities of daily living that may affect Transfer Assistance: None risk of falls: Patient Identification Verified: Yes Signs or symptoms of abuse/neglect since last No Secondary Verification Process Yes visito Completed: Hospitalized since last visit: No Patient Requires Transmission-Based No Implantable device outside of the clinic excluding No Precautions: cellular tissue based products placed in the center Patient Has Alerts: Yes since last visit: Patient Alerts: non Has Dressing in Place as Prescribed: Yes compressable Has Compression in Place as Prescribed: Yes Pain Present Now: No Electronic Signature(s) Signed: 01/07/2020 5:36:36 PM By: Kela Millin Entered By: Kela Millin on 01/07/2020 08:09:49 -------------------------------------------------------------------------------- Compression Therapy Details Patient Name: Date of Service: [name], [name] 01/07/2020 8:15 AM Medical Record [id number] Patient Account Number: [id number] Date of Birth/Sex: Treating RN: 12-06-1945 (74 y.o. [name] Primary Care Quintus Premo: [name] Other Clinician: Referring Jennings Corado: Treating Yuto Cajuste/Extender:Robson, [name], [name] in Treatment:  22 Compression Therapy Performed for Wound Wound #3 Right,Posterior Lower Leg Assessment: Performed By: Clinician [name], RN Compression Type: Rolena Infante Post Procedure Diagnosis Same as Pre-procedure Electronic Signature(s) Signed: 01/07/2020 6:02:31 PM By: [name] RN, BSN Entered By: [name] on 01/07/2020 08:51:51 -------------------------------------------------------------------------------- Encounter Discharge Information Details Patient Name: Date of Service: [name], [name] 01/07/2020 8:15 AM Medical Record [id number] Patient Account Number: [id number] Date of Birth/Sex: Treating RN: Jan 24, 1946 (74 y.o. [name] Primary Care Lela Gell: [name] Other Clinician: Referring Javana Schey: Treating Gray Doering/Extender:Robson, [name], [name] in Treatment: 22 Encounter Discharge Information Items Discharge Condition: Stable Ambulatory Status: Ambulatory Discharge Destination: Home Transportation: Private Auto Accompanied By: self Schedule Follow-up Appointment: Yes Clinical Summary of Care: Electronic Signature(s) Signed: 01/07/2020 5:42:42 PM By: [name] Entered By: [name] on 01/07/2020 09:09:24 -------------------------------------------------------------------------------- Lower Extremity Assessment Details Patient Name: Date of Service: [name], [name] 01/07/2020 8:15 AM Medical Record [id number] Patient Account Number: [id number] Date of Birth/Sex: Treating RN: 1945-11-23 (73 y.o. [name] Primary Care Tamie Minteer: [name] Other Clinician: Referring Davetta Olliff: Treating Aarya Robinson/Extender:Robson, [name], [name] in Treatment: 22 Edema Assessment Assessed: [Left: No] [Right: No] Edema: [Left: Ye] [Right: s] Calf Left: Right: Point of Measurement: 30 cm From Medial Instep cm 35 cm Ankle Left: Right: Point of Measurement: 10 cm From Medial Instep cm  23.4 cm Vascular Assessment Pulses: Dorsalis Pedis Palpable: [Right:Yes] Electronic Signature(s) Signed: 01/07/2020 5:36:36 PM By: Kela Millin Entered By: Kela Millin on 01/07/2020 08:12:25 -------------------------------------------------------------------------------- Multi Wound Chart Details Patient Name: Date of Service: [name], [name] 01/07/2020 8:15 AM Medical Record [id number] Patient Account Number: [id number] Date of Birth/Sex: Treating RN: October 05, 1945 (74 y.o. 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] Comorbid History: [3:Cataracts, Anemia, Arrhythmia, Congestive Heart Failure, Hypertension, Heart Failure, Hypertension, Type II Diabetes, Gout, Neuropathy] [5:Cataracts, Anemia, Arrhythmia, Congestive Type II Diabetes, Gout, Neuropathy] [N/A:N/A] Date Acquired: [3:06/06/2019] [5:09/19/2019] [N/A:N/A] Weeks of Treatment: [3:22] [5:15] [N/A:N/A] Wound Status: [3:Open] [5:Open] [N/A:N/A] Measurements L x W x D 9x3.5x0.1 [5:0.3x0.3x0.1] [N/A:N/A] (cm) Area (cm) : [3:24.74] [5:0.071] [N/A:N/A] Volume (cm) : [3:2.474] [5:0.007] [N/A:N/A] % Reduction in Area: [3:-9.80%] [5:87.90%] [N/A:N/A] % Reduction in Volume: [3:63.40%] [5:88.10%] [N/A:N/A] Classification: [3:Full Thickness Without Exposed Support Structures] [5:Grade 2] [N/A:N/A] Exudate Amount:  [3:Medium] [5:Small] [N/A:N/A] Exudate Type: [3:Serosanguineous] [5:Serous] [N/A:N/A] Exudate Color: [3:red, brown] [5:amber] [N/A:N/A] Wound Margin: [3:Flat and Intact] [5:Flat and Intact] [N/A:N/A] Granulation Amount: [3:Large (67-100%)] [5:Medium (34-66%)] [N/A:N/A] Granulation Quality: [3:Red] [5:Pink, Pale] [N/A:N/A] Necrotic Amount: [3:Small (1-33%)] [5:Medium (34-66%)] [N/A:N/A] Exposed Structures: [3:Fat Layer (Subcutaneous Fat Layer (Subcutaneous Tissue) Exposed: Yes Fascia: No Tendon: No Muscle: No Joint: No Bone: No] [5:Tissue) Exposed: Yes Fascia: No Tendon: No Muscle: No Joint: No Bone: No] [N/A:N/A] Epithelialization: [3:Medium (34-66%)] [5:Medium (34-66%)] [N/A:N/A] Procedures Performed: [3:Chemical Cauterization Compression Therapy] [5:N/A] [N/A:N/A] Treatment Notes Electronic Signature(s) Signed: 01/07/2020 5:34:04 PM By: [name] MD Signed: 01/07/2020 6:02:31 PM By: [name] RN, BSN Entered By: [name] on 01/07/2020 09:04:08 -------------------------------------------------------------------------------- Multi-Disciplinary Care Plan Details Patient Name: Date of Service: [name], [name] 01/07/2020 8:15 AM Medical Record [id number] Patient Account Number: [id number] Date of Birth/Sex: Treating RN: 19-Sep-1946 (73 y.o. [name] Primary Care Revella Shelton: [name] Other Clinician: Referring Barba Solt: Treating Fiorella Hanahan/Extender:Robson, [name], [name] in Treatment: 22 Active Inactive Wound/Skin Impairment Nursing Diagnoses: Impaired tissue integrity Goals: Patient/caregiver will verbalize understanding of skin care regimen Date Initiated: 08/06/2019 Target Resolution Date: 01/25/2020 Goal Status: Active Ulcer/skin breakdown will have a volume reduction of 30% by week 4 Target Resolution Date Initiated: 08/06/2019 Date Inactivated: 09/03/2019 Date: 09/07/2019 Unmet Reason: PAD, Goal Status: Unmet  necrotic surface Interventions: Assess patient/caregiver ability to obtain necessary supplies Assess patient/caregiver ability to perform ulcer/skin care regimen upon admission and as needed Assess ulceration(s) every visit Provide education on ulcer and skin care Notes: Electronic Signature(s) Signed: 01/07/2020 6:02:31 PM By: [name] RN, BSN Entered By: [name] on 01/07/2020 08:46:43 -------------------------------------------------------------------------------- Pain Assessment Details Patient Name: Date of Service: [name], [name] 01/07/2020 8:15 AM Medical Record [id number] Patient Account Number: [id number] Date of Birth/Sex: Treating RN: 1946/07/12 (74 y.o. [name] Primary Care Stylianos Stradling: [name] Other Clinician: Referring Koichi Platte: Treating Lindalee Huizinga/Extender:Robson, [name], [name] in Treatment: 22 Active Problems Location of Pain Severity and Description of Pain Patient Has Paino No Site Locations Pain Management and Medication Current Pain Management: Electronic Signature(s) Signed: 01/07/2020 5:36:36 PM By: Kela Millin Entered By: Kela Millin on 01/07/2020 08:10:41 -------------------------------------------------------------------------------- Patient/Caregiver Education Details Patient Name: Date of Service: [name], [name] 4/19/2021andnbsp8:15 AM Medical Record [id number] Patient Account Number: [id number] Date of Birth/Gender: Treating RN: 07-16-1946 (74 y.o. [name] Primary Care Physician: [name] Other Clinician: Referring Physician: Treating Physician/Extender:Robson, [name], [name] in Treatment: 22 Education Assessment Education Provided To: Patient Education Topics Provided Wound/Skin Impairment: Methods: Explain/Verbal Responses: State content correctly [employer]) Signed: 01/07/2020 6:02:31 PM By: [name] RN, BSN Entered By: [name] on 01/07/2020 08:46:54 -------------------------------------------------------------------------------- Wound Assessment Details Patient Name: Date of Service: [name], [name] 01/07/2020 8:15 AM Medical Record [id number] Patient Account Number: [id number] Date of Birth/Sex: Treating RN: 1946-04-04 (73 y.o. M)  Kela Millin Primary Care Jamelah Sitzer: [name] Other Clinician: Referring Aris Even: Treating Varick Keys/Extender:Robson, [name], [name] in Treatment: 22 Wound Status Wound Number: 3 Primary Venous Leg Ulcer Etiology: Wound Location: Right, Posterior Lower Leg Wound Open Wounding Event: Trauma Status: Date Acquired: 06/06/2019 Comorbid Cataracts, Anemia, Arrhythmia, Congestive Weeks Of Treatment: 22 History: Heart Failure, Hypertension, Type II Diabetes, Clustered Wound: No Gout, Neuropathy Wound Measurements Length: (cm) 9 % Reducti Width: (cm) 3.5 % Reducti Depth: (cm) 0.1 Epit Area: (cm) 24.74 Tun Volume: (cm) 2.474 Und Wound Description Full Thickness Without Exposed Support Classification: Structures Wound Flat and Intact Margin: Exudate Medium Amount: Exudate Serosanguineous Type: Exudate red, brown Color: Wound Bed Granulation Amount: Large (67-100%) Granulation Quality: Red Necrotic Amount: Small (1-33%) Necrotic Quality: Adherent Slough Foul Odor After Cleansing: No Slough/Fibrino Yes Exposed Structure Fascia Exposed: No Fat Layer (Subcutaneous Tissue) Exposed: Yes Tendon Exposed: No Muscle Exposed: No Joint Exposed: No Bone Exposed: No on in Area: -9.8% on in Volume: 63.4% helialization: Medium (34-66%) neling: No ermining: No Treatment Notes Wound #3 (Right, Posterior Lower Leg) 1. Cleanse With Wound Cleanser Soap and water 2. Periwound Care Moisturizing lotion 3. Primary Dressing Applied Hydrofera Blue 4. Secondary Dressing ABD  Pad Foam Kerramax/Xtrasorb 6. Support Layer [employer] 7. Footwear/Offloading device applied Surgical shoe Notes HB classic with normal saline. foam applied for protection at achilles area per patient. Electronic Signature(s) Signed: 01/07/2020 5:36:36 PM By: Kela Millin Entered By: Kela Millin on 01/07/2020 08:15:52 -------------------------------------------------------------------------------- Wound Assessment Details Patient Name: Date of Service: [name], [name] 01/07/2020 8:15 AM Medical Record [id number] Patient Account Number: [id number] Date of Birth/Sex: Treating RN: 06-Feb-1946 (74 y.o. [name] Primary Care Saren Corkern: [name] Other Clinician: Referring Ashwika Freels: Treating Erdine Hulen/Extender:Robson, [name], [name] in Treatment: 22 Wound Status Wound Number: 5 Primary Diabetic Wound/Ulcer of the Lower Extremity Etiology: Wound Location: Right Toe Fifth Wound Open Wounding Event: Blister Status: Date Acquired: 09/19/2019 Comorbid Cataracts, Anemia, Arrhythmia, Congestive Weeks Of Treatment: 15 History: Heart Failure, Hypertension, Type II Diabetes, Clustered Wound: No Gout, Neuropathy Wound Measurements Length: (cm) 0.3 % Reduction in Width: (cm) 0.3 % Reduction in Depth: (cm) 0.1 Epithelializati Area: (cm) 0.071 Tunneling: Volume: (cm) 0.007 Undermining: Wound Description Classification: Grade 2 Foul Odor After Wound Margin: Flat and Intact Slough/Fibrino Exudate Amount: Small Exudate Type: Serous Exudate Color: amber Wound Bed Granulation Amount: Medium (34-66%) Granulation Quality: Pink, Pale Fascia Exposed: Necrotic Amount: Medium (34-66%) Fat Layer (Subc Necrotic Quality: Adherent Slough Tendon Exposed: Muscle Exposed: Joint Exposed: Bone Exposed: Cleansing: No Yes Exposed Structure No utaneous Tissue) Exposed: Yes No No No No Area: 87.9% Volume: 88.1% on: Medium  (34-66%) No No Treatment Notes Wound #5 (Right Toe Fifth) 1. Cleanse With Wound Cleanser Soap and water 2. Periwound Care Moisturizing lotion 3. Primary Dressing Applied Collegen AG Hydrogel or K-Y Jelly 4. Secondary Dressing Dry Gauze 5. Secured With Medipore tape 7. Footwear/Offloading device applied Surgical shoe Notes applied a rolled gauze under toe as directed. Electronic Signature(s) Signed: 01/07/2020 5:36:36 PM By: Kela Millin Entered By: Kela Millin on 01/07/2020 08:14:48 -------------------------------------------------------------------------------- Vitals Details Patient Name: Date of Service: [name], [name] 01/07/2020 8:15 AM Medical Record [id number] Patient Account Number: [id number] Date of Birth/Sex: Treating RN: 18-Mar-1946 (73 y.o. [name] Primary Care Karilyn Wind: [name] Other Clinician: Referring Lisaanne Lawrie: Treating Darris Staiger/Extender:Robson, [name], [name] in Treatment: 22 Vital Signs Time Taken: 08:00 Temperature (F): 98 Height (in): 67 Pulse (bpm): 52 Weight (lbs): 190 Respiratory  Rate (breaths/min): 19 Body Mass Index (BMI): 29.8 Blood Pressure (mmHg): 170/67 Capillary Blood Glucose (mg/dl): 120 Reference Range: 80 - 120 mg / dl Notes patient stated CBG was 120 this morning Electronic Signature(s) Signed: 01/07/2020 5:36:36 PM By: Kela Millin Entered By: Kela Millin on 01/07/2020 08:10:33

## 2020-01-07 NOTE — Progress Notes (Signed)
JAX, KENTNER ([id number]) Visit Report for 01/07/2020 HPI Details Patient Name: Date of Service: [name], Michael 01/07/2020 8:15 AM Medical Record [id number] Patient Account Number: [id number] Date of Birth/Sex: Treating RN: 1946/07/01 (74 y.o. [name] Primary Care Provider: [name] Other Clinician: Referring Provider: Treating Provider/Extender:Elenna Spratling, [name], [name] in Treatment: 17 History of Present Illness HPI Description: [name] HPI Description: 74 year old gentleman who was recently seen by his nephrologist [name], and noted to have a wound on his left lower extremity which was lacerated 2 months ago and now has reopened. The patient's left shin has a ulceration with some exudate but no evidence of infection and he was referred to [place] for further care as it was known that the patient has had some peripheral vascular disease in the past. Past medical history significant for chronic kidney disease, atrial fibrillation, diabetes mellitus,status post kidney transplant in 1983 and 2005, a week fistula graft placement, status post previous bowel surgery. 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 10/06/2015 -- x-ray of the left tibia and fibula shows no evidence of osteomyelitis. The patient has also had Doppler studies of his extremity and is awaiting the appointment with the vascular surgeon. We have not yet received these reports. 10/13/2015 -- lower extremity venous duplex reflux evaluation shows reflux in the left common femoral vein, left saphenofemoral junction and the proximal greater saphenous vein extending to the proximal calf. There is also reflux in the left proximal to mid small saphenous vein. Arterial duplex studies done showed the resting ABI was not applicable due to tibial artery medial calcification. 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 The right TBI was 1 which is normal and the left ABI was 1 which is normal. Patient has otherwise been doing fine and has been compliant with his dressings. 10/20/2015 -- He was seen by [name] recently for a vascular opinion on 10/15/2015. His left lower extremity venous insufficiency duplex study revealed GSV reflux,SS vein reflux and deep venous reflux in the common femoral vein. His ABIs were non compressible and his TBI on the right was 1.01 and on the left was 0.80. He was asked to continue with the wound care with compressive therapy followed by EVLA of the left GS vein 3 months. He recommended 20-30 mm thigh-high compression stockings and the need for a three-month trial of this. The patient had an Unna boot applied at the vascular office but he could not tolerate this with a lot of pain and issues with his toes and hence came here on Friday for removal of this and we reapplied a 2 layer compression. 11/10/2015 -- patient still has not purchased his 20-30 mm thigh-high compression stockings as prescribed by [name]. Readmission: 08/08/18 on evaluation today patient presents for readmission concerning a new injury to the left anterior lower extremity. He was previously seen in 2017 here in our clinic. He states that he has done fairly well since that point. Nonetheless he is having at this time some pain but states that he hit this on a table that fell over and actually struck his leg. This appears to have pulled back some of his skin which folded in on itself and is causing some difficulty as far as that is concerned. There does not appear to be any evidence of  infection at this time. No fevers, chills, nausea, or vomiting noted at this time. He's been using dressings on his own currently without complication. 08/15/18 on evaluation today patient actually appears to be  doing somewhat better in regard to his wound of the lower [name] when compared to the first visit last week. I had to do a much more extensive debridement at that time it does appear that I'm gonna have to perform some debridement today but it does not look to be as extensive by any means. Nonetheless fortunately he does not show any signs of infection he does have discomfort at this site. I believe based on what I'm seeing currently he may benefit from Iodoflex to help keep the wound bed clean. Patient tolerated therapy without complication. Upon evaluation today the patient actually appears to be doing excellent in regard to his left lower extremity ulcer. This is much better than the previous two visits where he had a lot of necrotic tissue around the edge of the wound simply due to the fact that again there was a significant skin tear where the edge had been cleared away prior to reattaching and being able to heal appropriately. He seems to be doing much better at this point. 08/28/18 on evaluation today the patient's wound actually does appear to be showing signs of improvement. With that being said though he is improving he would likely note even greater improvement if we were able to sharply debride the wound. Nonetheless this caused him to much discomfort he tells me. 09/04/18 on evaluation today patient actually appears to be showing signs of improvement in regard to his left lower extremity ulcer. He has been tolerating the dressing changes including the wrap although he tells me at this point that the burning does last for a couple of days even with just the Iodoflex. I was afraid that this may been part of the issue that he was having with discomfort. It does seem to be the case. Nonetheless he shows no signs of evidence of infection at this time which is good news. No fevers chills noted ADMISSION to [name] 10/05/2018 This is a patient who was cared for in 2017  and again in the fall of this year at our sister clinic and [facility]. He actually lives in [place] in [place]. We have been dealing with an apparent traumatic area on the left anterior tibial area. This is been present for the last several months. He was supposed to be using Iodoflex Kerlix and Coban however he was hospitalized from 09/05/2018 through 09/11/2018 with delirium secondary to pneumonia. Since then he is only been putting Vaseline gauze on this without compression. He also has a more recent skin tear on the dorsal right hand that may have only happened in the last week. The patient had arterial studies done in 2017 in January which was 3 years ago. At that point he had noncompressible ABIs but really quite good TBI's both normal. Triphasic waveforms on the right monophasic at the left posterior tibial but triphasic at the left dorsalis pedis. His ABIs in our clinic today were both noncompressible 1/23; the patient has wounds on the right dorsal hand just distal to the wrist and on the left anterior lower extremity. Both of these look very healthy he is using Hydrofera Blue 1/30; left anterior lower extremity wound much smaller. Healthy looking surface. The laceration area just distal to the wrist on the dorsal hand on the right is also just  about closed I used Hydrofera Blue here 2/6; left anterior lower extremity wound is much smaller but still open. The laceration area just distal to the wrist on the dorsal hand is fully epithelialized. 2/13; the patient's anterior lower extremity wound is closed. The laceration just distal to the wrist on the dorsal hand is also fully epithelialized and closed. The patient has external compression stockings which I think are 20/30 READMISSION 08/06/2019 Mr. Wichman is a 74 year old man with had several times previously in our clinic. He is a diabetic with a history of chronic renal insufficiency status post kidney transplant in 1983 and again  in 2005. He was then in 2017 with a laceration on the left lower extremity. He was worked up at the time with arterial studies and reflux studies. The arterial studies showed ABIs to be noncompressible but TBI's were within normal limits. I do not have the reflux studies at the moment. He was also sent here in 2019 with a left lower extremity wound and then again in 2020 with left lower extremity trauma a skin tear on the wrist. He was discharged with 20/30 stockings identified from myself that that might not be enough compression. Nevertheless he states he was wearing these fairly reliably. In September he had a fall with a substantial bruise in the area of the wound. He says he saw orthopedics and they told him there was some muscle strain sometime it later this opened into a wound. He has a fairly substantial wound on the right posterior calf. Satellite areas around this including medially and posteriorly. He has not worn his stockings since the injury Past medical history; includes chronic renal failure secondary to diabetes with kidney transplant x2, atrial fibrillation, heart failure with preserved ejection fraction, coronary artery disease. ABIs on the right in our clinic were once again noncompressible 08/13/2019 on evaluation today patient appears to be doing decently well with regard to his wound compared to last week's evaluation. Unfortunately he is still having a lot of discomfort at this point which is I think in some part due to the 3 layer compression wrap which is a little bit stronger I think for him. When he was here before we actually utilized a Kerlix and Coban wrap which he states seemed to got a little bit better. Nonetheless I think we can probably drop back to this in light of the discomfort that he had. Nonetheless the pain was not really right around the wound itself as much as it was around the ankle in particular. The Iodoflex does seem to have done well for him As the  wound is appearing somewhat better today which is excellent news. 11/30; still complaining of a lot of pain. Apparently arterial studies I ordered 2 weeks ago are below. I do not believe we have an appointment with vein and vascular as of yet; ABI Findings: +---------+------------------+-----+----------+--------+ Right Rt Pressure (mmHg)IndexWaveform Comment  +---------+------------------+-----+----------+--------+ PTA >254 1.50 monophasic  +---------+------------------+-----+----------+--------+ DP >254 1.50 monophasic  +---------+------------------+-----+----------+--------+ Great Toe68 0.40 Abnormal   +---------+------------------+-----+----------+--------+ +---------+------------------+-----+----------+-------+ Left Lt Pressure (mmHg)IndexWaveform Comment +---------+------------------+-----+----------+-------+ Brachial 169     +---------+------------------+-----+----------+-------+ PTA >254 1.50 monophasic  +---------+------------------+-----+----------+-------+ DP >254 1.50 monophasic  +---------+------------------+-----+----------+-------+ Great Toe40 0.24 Abnormal   +---------+------------------+-----+----------+-------+ Pedal arteries appear hyperemic. Patient refused Brachial pressure in the right arm. Summary: Right: Resting right ankle-brachial index indicates noncompressible right lower extremity arteries. The right toe- brachial index is abnormal. Left: Resting left ankle-brachial index indicates noncompressible left lower extremity arteries. The left toe-brachial index is abnormal.  He is also having considerably more swelling in his left calf. This was not there when I last saw him 2 weeks ago. He tells me that some of the home health compression wraps have been slipping down and that may be the issue here however a month I am uncertain 66/4; sees vascular on December 22. Still complaining of a lot of pain. DVT study  I did last time was negative for DVT 12/14; still complaining of pain which if this is arterial is certainly claudication and rest keeps him uncomfortable at night. He has an appointment with vein and vascular on December 22. Wound surface is better using Iodoflex. Once the surface of this is satisfactory and we have exhausted the vascular route. Perhaps an advanced treatment option. He has a configuration of the venous ulceration although his arterial studies are not very good. The other issue is the patient has a transplanted kidney. This will make angiography difficult and challenging issue 12/21; still complaining of pain and drainage. We are using Iodoflex on the wound under compression. He sees Dr. Donnetta Hutching tomorrow to evaluate his noninvasive studies noted above. He has a transplanted kidney further complicating the options for angiography. 12/28; still using Iodoflex under compression. I have Dr. Luther Parody note from 12/22. he noted arterial studies revealing monophasic waveforms at the pedal vessels bilaterally and calcified vessels making the ABI unreliable. He did not comment on the reduced TBI's. He felt these were venous wounds based on the palpable dorsalis pedis pulse. 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 And they arranged for formal venous duplex with reflux studies in the next several weeks. Follow-up with either Scot Dock or Dr. Oneida Alar 09/24/2019 still using Iodoflex under compression. He has an appointment with Dr. Doren Custard on 1/7 with regards to his venous disease. The patient was not felt to have a primary arterial problem for the nonhealing of his wound. We did attempt to wrap him with 3 layer when he first came into the clinic he complained of a lot of pain in the ankle although this may have been because the dressing fell down somewhat. He has far too edema fluid in the right leg for a good prognosis about healing this wound He comes in today with an excoriation on the  bottom part of his right fifth toe. He thinks he may have done this taking off his clothes and that something got caught on the toe. There is no open wound however the toe itself is very painful 1/11; we are using Iodoflex under compression. The wound bed is clean. He went on to see Dr. Doren Custard on 09/27/2019 he again feels that the patient's arterial supply is adequate. He feels that he might benefit from right greater saphenous vein ablation for a venous ulcer. In the meantime the area that he was complaining about last week on the right fifth toe. An x-ray that I ordered showed marked soft tissue swelling along the distal aspect of the right foot but there was no evidence of osteomyelitis. He comes in today with the fifth toenail just about coming off. He has black eschar underneath the toe on the plantar aspect. The toe was swollen red and very painful. In the right setting this could be a significant soft tissue infection versus ischemia of the toe itself. He did not show this to Dr. Doren Custard 1/18; I am using Iodoflex under compression a large wound on the right posterior calf. He had his right greater saphenous vein ablation by Dr. Doren Custard  although I am not sure that is the only problem here. The right fifth toe which was possibly trauma 2 weeks ago continues to be exceptionally painful with a necrotic tip. Maybe not quite as swollen. I started him empirically on Augmentin 5/125 1 p.o. twice daily last week after discussing this with Dr. Loletha Grayer of nephrology. Perhaps somewhat better this week but not as good as I was hoping. A plain x-ray was negative. He comes in today with an area on the medial right calf that was blistered and now open. In looking at things he appears to be systemically fluid volume overloaded. He has a transplanted kidney. He states he takes his Lasix variably when he has appointments he tries not to take it the later I am really not certain if he takes this reliably. However he has  far too much edema fluid in the right leg to easily heal this wound and he appears to be developing blisters medially to form additional wounds 1/25; his CO2 angiogram was done by Dr. Doren Custard last week. This showed the proximal arteries all to be patent. On the right side the common femoral deep femoral superficial femoral popliteal anterior tibial and peroneal arteries were patent. The posterior tibial was occluded but reconstituted distally via collaterals from the peroneal artery he was felt he should have enough blood flow to heal his wounds including the right fifth toe. The right fifth toe looks better however he is still complaining of a lot of pain. The large area which is a venous ulceration posteriorly has a better looking surface I think we can switch to Hydrofera Blue today 2/1; the patient's original wounds on the right posterior medial calf has come down in width however superior to this he has new denuded areas and I am concerned we simply do not have enough edema control. He has already undergone a right greater saphenous ablation. He had a CO2 angiogram done by Dr. Doren Custard and the comment was that we should have enough blood flow to heal the venous wound however we simply do not seem to have enough edema control with 3 layer compression. The patient is status post kidney transplant although his exact renal function is not really clear. Nor am i sure what dose of diuretic he is supposed to be on. He also has the area on the right fifth toe which was unclear etiology but I think became secondarily infected I gave him 2 weeks of antibiotics for this and this seems to have settled down he still has a black eschar over the tip of the toe. X-ray was negative for fracture. He says he has a history of gout. 2/8; the patient's wound on the right posterior calf is about the same. The superficial area medially also about the same. He got a prescription for prednisoneo Gout after he developed  erythema on the dorsal aspect of his left great toe going along with the right fifth toe which has been problematic all along. He has not taken it because he is concerned about increasing CBGs. He has a transplanted kidney is already on prednisone 5 mg. He would not be a good candidate for NSAIDs. Perhaps colchicine. He is not aware of what his uric acid level is 2/15; his right posterior calf wound seems to be coming in in terms of width. Everything here looks fairly good. No mechanical debridement we have been using Hydrofera Blue. He has had an ablation by vein and vascular. Felt to have adequate arterial supply  to this area. The patient got prednisone last week from Dr. Angelique Holm of nephrology. At my urging he actually took it. The area on his left toe was a lot better. The right toe was not as painful but still erythematous with a wound at the tip. We have been using silver alginate here 2/22; right posterior calf seems to be gradually epithelializing. Still a fairly substantial wound. He still has an area on the tip of his right fifth toe which I think was gout related. This is gradually improving. We have been using Unna boots to wrap X-ray I did of the foot last time was again negative there was soft tissue irregularity about the distal fifth digit but no radiographic evidence of bone damage 3/1; right posterior calf seems to be gradually improving however there were areas of hyper granulation. We have been using Hydrofera Blue. The hyper granulation is mostly evident in the most superior finger shape projection of the wound itself. The area on the right fifth toe also was slough covered and required debridement. 3/8 continued improvement in the right posterior calf and the tip of the right fifth toe. We have been using Hydrofera Blue under compression he is changing the area to the toe 3/22; continued improvement in the right posterior calf. Unfortunately comes in today with a new  skin tear in the mid anterior tibia area this probably had something to do with changing his dressing home health I called and left this a message last week. 3/29; continued improvement in the a substantial wound on the right posterior calf. The skin tear anteriorly from last week is already healed on the tip of the right fifth toe there is still a nonviable area. 4/5; we have continued contraction of the substantial wound on the right posterior calf. We continue to have problems with the tip of the right fifth toe. We have been using Hydrofera Blue to both wound areas He is complaining about some discomfort in the Achilles area. Tells me he had surgery for what sounds like a torn Achilles about 10 years ago. 4/12; we have continued contraction of the substantial wound on the right posterior calf. Still an area on the tip of the right fifth toe. I changed him to silver collagen on the toe last week but I think home health continue to use Hydrofera Blue to both wound areas. 4/19; we continue to have contraction of the substantial wound on the right posterior calf however there continues to be hyper granulation. The area on the tip of the fifth toe is very small still some debris on the surface we have been using silver call Electronic Signature(s) Signed: 01/07/2020 5:34:04 PM By: [name] MD Entered By: [name] on 01/07/2020 09:05:00 -------------------------------------------------------------------------------- Chemical Cauterization Details Patient Name: Date of Service: AVROM, ROBARTS 01/07/2020 8:15 AM Medical Record [id number] Patient Account Number: [id number] Date of Birth/Sex: Treating RN: 1946-08-31 (74 y.o. [name] Primary Care Provider: [name] Other Clinician: Referring Provider: Treating Provider/Extender:Che Rachal, [name], [name] in Treatment: 22 Procedure Performed for: Wound #3 Right,Posterior Lower  Leg Performed By: Physician Ricard Dillon., MD Post Procedure Diagnosis Same as Pre-procedure Notes silver nitrate Electronic Signature(s) Signed: 01/07/2020 5:34:04 PM By: [name] MD Entered By: [name] on 01/07/2020 09:04:18 -------------------------------------------------------------------------------- Physical Exam Details Patient Name: Date of Service: BULMARO, FEAGANS 01/07/2020 8:15 AM Medical Record [id number] Patient Account Number: [id number] Date of Birth/Sex: Treating RN: 04-Jul-1946 (74 y.o. [name] Primary Care Provider: Marval Regal,  Governor Rooks Other Clinician: Referring Provider: Treating Provider/Extender:Suhailah Kwan, [name], [name] in Treatment: 79 Constitutional Patient is hypertensive.. Pulse regular and within target range for patient.Marland Kitchen Respirations regular, non-labored and within target range.. Temperature is normal and within the target range for the patient.Marland Kitchen Appears in no distress. Notes Wound exam Right fifth toe small area that is still open. There is debris on the surface but I did not debride this The wound on the right posterior calf is contracted nicely. Hyper granulation. I used silver nitrate to cauterize this area hopefully to allow further epithelialization over a hyper granulated Electronic Signature(s) Signed: 01/07/2020 5:34:04 PM By: [name] MD Entered By: [name] on 01/07/2020 09:06:19 -------------------------------------------------------------------------------- Physician Orders Details Patient Name: Date of Service: KERWIN, AUGUSTUS 01/07/2020 8:15 AM Medical Record [id number] Patient Account Number: [id number] Date of Birth/Sex: Treating RN: 1946/07/10 (74 y.o. [name] Primary Care Provider: [name] Other Clinician: Referring Provider: Treating Provider/Extender:Yazan Gatling, [name], [name] in Treatment: 22 Verbal / Phone  Orders: No Diagnosis Coding ICD-10 Coding Code Description S80.11XD Contusion of right lower leg, subsequent encounter L97.212 Non-pressure chronic ulcer of right calf with fat layer exposed I87.321 Chronic venous hypertension (idiopathic) with inflammation of right lower extremity E11.622 Type 2 diabetes mellitus with other skin ulcer L97.518 Non-pressure chronic ulcer of other part of right foot with other specified severity Follow-up Appointments Return Appointment in 1 week. Dressing Change Frequency Wound #3 Right,Posterior Lower Leg Other: - twice a week - wound clinic to change 1x a week on Monday, home health to change 1x a week on Thursday or Friday Wound #5 Right Toe Fifth Other: - twice a week - wound clinic to change 1x a week on Monday, home health to change 1x a week on Thursday or Friday Skin Barriers/Peri-Wound Care Wound #3 Right,Posterior Lower Leg Barrier cream Moisturizing lotion - to leg Wound Cleansing Clean wound with Normal Saline. - or normal saline on days that dressing is changed May shower with protection. Primary Wound Dressing Wound #3 Right,Posterior Lower Leg Hydrofera Blue - Classic Wound #5 Right Toe Fifth Silver Collagen - moisten with hydrogel or KY jelly Secondary Dressing Wound #3 Right,Posterior Lower Leg Foam - foam top bend of foot for protection ABD pad Zetuvit or Kerramax - or Xtrasorb Wound #5 Right Toe Fifth Kerlix/Rolled Gauze Dry Gauze Other: - felt or rolled up gauze under toe to elevate tip of toe off of shoe Edema Control Unna Boot to Right Lower Extremity - *****ONLY ONE SINGLE LAYER OF UNNA BOOT***** - foam to achilles to pad Avoid standing for long periods of time Elevate legs to the level of the heart or above for 30 minutes daily and/or when sitting, a frequency of: - throughout the day Exercise regularly Additional Orders / Instructions Other: - Go to ER if toe/leg gets more red/black, increased pain, fever [facility]. - [facility] Electronic Signature(s) Signed: 01/07/2020 5:34:04 PM By: [name] MD Signed: 01/07/2020 6:02:31 PM By: [name] RN, BSN Entered By: [name] on 01/07/2020 08:38:41 -------------------------------------------------------------------------------- Problem List Details Patient Name: Date of Service: COHAN, STIPES 01/07/2020 8:15 AM Medical Record [id number] Patient Account Number: [id number] Date of Birth/Sex: Treating RN: 10-Aug-1946 (73 y.o. [name] Primary Care Provider: [name] Other Clinician: Referring Provider: Treating Provider/Extender:Anne-Marie Genson, [name], [name] in Treatment: 22 Active Problems ICD-10 Evaluated Encounter Code Description Active Date Today Diagnosis S80.11XD Contusion of right lower  leg, subsequent encounter 08/06/2019 No Yes L97.212 Non-pressure chronic ulcer of right calf with fat layer 08/06/2019 No Yes exposed I87.321 Chronic venous hypertension (idiopathic) with 08/06/2019 No Yes inflammation of right lower extremity E11.622 Type 2 diabetes mellitus with other skin ulcer 08/06/2019 No Yes L97.518 Non-pressure chronic ulcer of other part of right foot 10/01/2019 No Yes with other specified severity Inactive Problems ICD-10 Code Description Active Date Inactive Date L97.811 Non-pressure chronic ulcer of other part of right lower leg 12/10/2019 12/10/2019 limited to breakdown of skin Resolved Problems Electronic Signature(s) Signed: 01/07/2020 5:34:04 PM By: [name] MD Entered By: [name] on 01/07/2020 09:04:01 -------------------------------------------------------------------------------- Progress Note Details Patient Name: Date of Service: LEILAND, MIHELICH 01/07/2020 8:15 AM Medical Record [id number] Patient Account Number: [id number] Date of Birth/Sex: Treating RN: [birth year] (74 y.o. [name] Primary Care Provider: [name] Other Clinician: Referring Provider: Treating Provider/Extender:Caitland Porchia, [name], [name] in Treatment: 22 Subjective History of Present Illness (HPI) [place] HPI Description: 74 year old gentleman who was recently seen by his nephrologist [name], and noted to have a wound on his left lower extremity which was lacerated 2 months ago and now has reopened. The patient's left shin has a ulceration with some exudate but no evidence of infection and he was referred to [place] for further care as it was known that the patient has had some peripheral vascular disease in the past. Past medical history significant for chronic kidney disease, atrial fibrillation, diabetes mellitus,status post kidney transplant in 1983 and 2005, a week fistula graft placement, status post previous bowel surgery. 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 10/06/2015 -- x-ray of the left tibia and fibula shows no evidence of osteomyelitis. The patient has also had Doppler studies of his extremity and is awaiting the appointment with the vascular surgeon. We have not yet received these reports. 10/13/2015 -- lower extremity venous duplex reflux evaluation shows reflux in the left common femoral vein, left saphenofemoral junction and the proximal greater saphenous vein extending to the proximal calf. There is also reflux in the left proximal to mid small saphenous vein. Arterial duplex studies done showed the resting ABI was not applicable due to tibial artery medial calcification. 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 The right TBI was 1 which is normal and the left ABI was 1 which is normal. Patient has otherwise been doing fine and has been compliant with his  dressings. 10/20/2015 -- He was seen by [name] recently for a vascular opinion on 10/15/2015. His left lower extremity venous insufficiency duplex study revealed GSV reflux,SS vein reflux and deep venous reflux in the common femoral vein. His ABIs were non compressible and his TBI on the right was 1.01 and on the left was 0.80. He was asked to continue with the wound care with compressive therapy followed by EVLA of the left GS vein 3 months. He recommended 20-30 mm thigh-high compression stockings and the need for a three-month trial of this. The patient had an Unna boot applied at the vascular office but he could not tolerate this with a lot of pain and issues with his toes and hence came here on Friday for removal of this and we reapplied a 2 layer compression. 11/10/2015 -- patient still has not purchased his 20-30 mm thigh-high compression stockings as  prescribed by [name]. Readmission: 08/08/18 on evaluation today patient presents for readmission concerning a new injury to the left anterior lower extremity. He was previously seen in 2017 here in our clinic. He states that he has done fairly well since that point. Nonetheless he is having at this time some pain but states that he hit this on a table that fell over and actually struck his leg. This appears to have pulled back some of his skin which folded in on itself and is causing some difficulty as far as that is concerned. There does not appear to be any evidence of infection at this time. No fevers, chills, nausea, or vomiting noted at this time. He's been using dressings on his own currently without complication. 08/15/18 on evaluation today patient actually appears to be doing somewhat better in regard to his wound of the lower [name] when compared to the first visit last week. I had to do a much more extensive debridement at that time it does appear that I'm gonna have to perform some debridement today but it does not look  to be as extensive by any means. Nonetheless fortunately he does not show any signs of infection he does have discomfort at this site. I believe based on what I'm seeing currently he may benefit from Iodoflex to help keep the wound bed clean. Patient tolerated therapy without complication. Upon evaluation today the patient actually appears to be doing excellent in regard to his left lower extremity ulcer. This is much better than the previous two visits where he had a lot of necrotic tissue around the edge of the wound simply due to the fact that again there was a significant skin tear where the edge had been cleared away prior to reattaching and being able to heal appropriately. He seems to be doing much better at this point. 08/28/18 on evaluation today the patient's wound actually does appear to be showing signs of improvement. With that being said though he is improving he would likely note even greater improvement if we were able to sharply debride the wound. Nonetheless this caused him to much discomfort he tells me. 09/04/18 on evaluation today patient actually appears to be showing signs of improvement in regard to his left lower extremity ulcer. He has been tolerating the dressing changes including the wrap although he tells me at this point that the burning does last for a couple of days even with just the Iodoflex. I was afraid that this may been part of the issue that he was having with discomfort. It does seem to be the case. Nonetheless he shows no signs of evidence of infection at this time which is good news. No fevers chills noted ADMISSION to [name] 10/05/2018 This is a patient who was cared for in 2017 and again in the fall of this year at our sister clinic and [facility]. He actually lives in [place] in [place]. We have been dealing with an apparent traumatic area on the left anterior tibial area. This is been present for the last several months. He was  supposed to be using Iodoflex Kerlix and Coban however he was hospitalized from 09/05/2018 through 09/11/2018 with delirium secondary to pneumonia. Since then he is only been putting Vaseline gauze on this without compression. He also has a more recent skin tear on the dorsal right hand that may have only happened in the last week. The patient had arterial studies done in 2017 in January which was 3  years ago. At that point he had noncompressible ABIs but really quite good TBI's both normal. Triphasic waveforms on the right monophasic at the left posterior tibial but triphasic at the left dorsalis pedis. His ABIs in our clinic today were both noncompressible 1/23; the patient has wounds on the right dorsal hand just distal to the wrist and on the left anterior lower extremity. Both of these look very healthy he is using Hydrofera Blue 1/30; left anterior lower extremity wound much smaller. Healthy looking surface. The laceration area just distal to the wrist on the dorsal hand on the right is also just about closed I used Hydrofera Blue here 2/6; left anterior lower extremity wound is much smaller but still open. The laceration area just distal to the wrist on the dorsal hand is fully epithelialized. 2/13; the patient's anterior lower extremity wound is closed. The laceration just distal to the wrist on the dorsal hand is also fully epithelialized and closed. The patient has external compression stockings which I think are 20/30 READMISSION 08/06/2019 Mr. Thelin is a 74 year old man with had several times previously in our clinic. He is a diabetic with a history of chronic renal insufficiency status post kidney transplant in 1983 and again in 2005. He was then in 2017 with a laceration on the left lower extremity. He was worked up at the time with arterial studies and reflux studies. The arterial studies showed ABIs to be noncompressible but TBI's were within normal limits. I do not have the  reflux studies at the moment. He was also sent here in 2019 with a left lower extremity wound and then again in 2020 with left lower extremity trauma a skin tear on the wrist. He was discharged with 20/30 stockings identified from myself that that might not be enough compression. Nevertheless he states he was wearing these fairly reliably. In September he had a fall with a substantial bruise in the area of the wound. He says he saw orthopedics and they told him there was some muscle strain sometime it later this opened into a wound. He has a fairly substantial wound on the right posterior calf. Satellite areas around this including medially and posteriorly. He has not worn his stockings since the injury Past medical history; includes chronic renal failure secondary to diabetes with kidney transplant x2, atrial fibrillation, heart failure with preserved ejection fraction, coronary artery disease. ABIs on the right in our clinic were once again noncompressible 08/13/2019 on evaluation today patient appears to be doing decently well with regard to his wound compared to last week's evaluation. Unfortunately he is still having a lot of discomfort at this point which is I think in some part due to the 3 layer compression wrap which is a little bit stronger I think for him. When he was here before we actually utilized a Kerlix and Coban wrap which he states seemed to got a little bit better. Nonetheless I think we can probably drop back to this in light of the discomfort that he had. Nonetheless the pain was not really right around the wound itself as much as it was around the ankle in particular. The Iodoflex does seem to have done well for him As the wound is appearing somewhat better today which is excellent news. 11/30; still complaining of a lot of pain. Apparently arterial studies I ordered 2 weeks ago are below. I do not believe we have an appointment with vein and vascular as of yet; ABI  Findings: +---------+------------------+-----+----------+--------+ Right Rt  Pressure (mmHg)IndexWaveform Comment  +---------+------------------+-----+----------+--------+ PTA >254 1.50 monophasic  +---------+------------------+-----+----------+--------+ DP >254 1.50 monophasic  +---------+------------------+-----+----------+--------+ Great Toe68 0.40 Abnormal   +---------+------------------+-----+----------+--------+ +---------+------------------+-----+----------+-------+ Left Lt Pressure (mmHg)IndexWaveform Comment +---------+------------------+-----+----------+-------+ Brachial 169     +---------+------------------+-----+----------+-------+ PTA >254 1.50 monophasic  +---------+------------------+-----+----------+-------+ DP >254 1.50 monophasic  +---------+------------------+-----+----------+-------+ Great Toe40 0.24 Abnormal   +---------+------------------+-----+----------+-------+ Pedal arteries appear hyperemic. Patient refused Brachial pressure in the right arm. Summary: Right: Resting right ankle-brachial index indicates noncompressible right lower extremity arteries. The right toe- brachial index is abnormal. Left: Resting left ankle-brachial index indicates noncompressible left lower extremity arteries. The left toe-brachial index is abnormal. He is also having considerably more swelling in his left calf. This was not there when I last saw him 2 weeks ago. He tells me that some of the home health compression wraps have been slipping down and that may be the issue here however a month I am uncertain 24/2; sees vascular on December 22. Still complaining of a lot of pain. DVT study I did last time was negative for DVT 12/14; still complaining of pain which if this is arterial is certainly claudication and rest keeps him uncomfortable at night. He has an appointment with vein and vascular on December 22. Wound surface is  better using Iodoflex. Once the surface of this is satisfactory and we have exhausted the vascular route. Perhaps an advanced treatment option. He has a configuration of the venous ulceration although his arterial studies are not very good. The other issue is the patient has a transplanted kidney. This will make angiography difficult and challenging issue 12/21; still complaining of pain and drainage. We are using Iodoflex on the wound under compression. He sees Dr. Donnetta Hutching tomorrow to evaluate his noninvasive studies noted above. He has a transplanted kidney further complicating the options for angiography. 12/28; still using Iodoflex under compression. I have Dr. Luther Parody note from 12/22. he noted arterial studies revealing monophasic waveforms at the pedal vessels bilaterally and calcified vessels making the ABI unreliable. He did not comment on the reduced TBI's. He felt these were venous wounds based on the palpable dorsalis pedis pulse. 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 And they arranged for formal venous duplex with reflux studies in the next several weeks. Follow-up with either Scot Dock or Dr. Oneida Alar 09/24/2019 still using Iodoflex under compression. He has an appointment with Dr. Doren Custard on 1/7 with regards to his venous disease. The patient was not felt to have a primary arterial problem for the nonhealing of his wound. We did attempt to wrap him with 3 layer when he first came into the clinic he complained of a lot of pain in the ankle although this may have been because the dressing fell down somewhat. He has far too edema fluid in the right leg for a good prognosis about healing this wound He comes in today with an excoriation on the bottom part of his right fifth toe. He thinks he may have done this taking off his clothes and that something got caught on the toe. There is no open wound however the toe itself is very painful 1/11; we are using Iodoflex under compression. The  wound bed is clean. He went on to see Dr. Doren Custard on 09/27/2019 he again feels that the patient's arterial supply is adequate. He feels that he might benefit from right greater saphenous vein ablation for a venous ulcer. In the meantime the area that he was complaining about last week on the right fifth toe. An x-ray that  I ordered showed marked soft tissue swelling along the distal aspect of the right foot but there was no evidence of osteomyelitis. He comes in today with the fifth toenail just about coming off. He has black eschar underneath the toe on the plantar aspect. The toe was swollen red and very painful. In the right setting this could be a significant soft tissue infection versus ischemia of the toe itself. He did not show this to Dr. Doren Custard 1/18; I am using Iodoflex under compression a large wound on the right posterior calf. He had his right greater saphenous vein ablation by Dr. Doren Custard although I am not sure that is the only problem here. ooThe right fifth toe which was possibly trauma 2 weeks ago continues to be exceptionally painful with a necrotic tip. Maybe not quite as swollen. I started him empirically on Augmentin 5/125 1 p.o. twice daily last week after discussing this with Dr. Loletha Grayer of nephrology. Perhaps somewhat better this week but not as good as I was hoping. A plain x-ray was negative. He comes in today with an area on the medial right calf that was blistered and now open. In looking at things he appears to be systemically fluid volume overloaded. He has a transplanted kidney. He states he takes his Lasix variably when he has appointments he tries not to take it the later I am really not certain if he takes this reliably. However he has far too much edema fluid in the right leg to easily heal this wound and he appears to be developing blisters medially to form additional wounds 1/25; his CO2 angiogram was done by Dr. Doren Custard last week. This showed the proximal arteries all to be  patent. On the right side the common femoral deep femoral superficial femoral popliteal anterior tibial and peroneal arteries were patent. The posterior tibial was occluded but reconstituted distally via collaterals from the peroneal artery he was felt he should have enough blood flow to heal his wounds including the right fifth toe. The right fifth toe looks better however he is still complaining of a lot of pain. The large area which is a venous ulceration posteriorly has a better looking surface I think we can switch to Hydrofera Blue today 2/1; the patient's original wounds on the right posterior medial calf has come down in width however superior to this he has new denuded areas and I am concerned we simply do not have enough edema control. He has already undergone a right greater saphenous ablation. He had a CO2 angiogram done by Dr. Doren Custard and the comment was that we should have enough blood flow to heal the venous wound however we simply do not seem to have enough edema control with 3 layer compression. The patient is status post kidney transplant although his exact renal function is not really clear. Nor am i sure what dose of diuretic he is supposed to be on. He also has the area on the right fifth toe which was unclear etiology but I think became secondarily infected I gave him 2 weeks of antibiotics for this and this seems to have settled down he still has a black eschar over the tip of the toe. X-ray was negative for fracture. He says he has a history of gout. 2/8; the patient's wound on the right posterior calf is about the same. The superficial area medially also about the same. He got a prescription for prednisoneo Gout after he developed erythema on the dorsal aspect of his left  great toe going along with the right fifth toe which has been problematic all along. He has not taken it because he is concerned about increasing CBGs. He has a transplanted kidney is already on prednisone  5 mg. He would not be a good candidate for NSAIDs. Perhaps colchicine. He is not aware of what his uric acid level is 2/15; his right posterior calf wound seems to be coming in in terms of width. Everything here looks fairly good. No mechanical debridement we have been using Hydrofera Blue. He has had an ablation by vein and vascular. Felt to have adequate arterial supply to this area. The patient got prednisone last week from Dr. Angelique Holm of nephrology. At my urging he actually took it. The area on his left toe was a lot better. The right toe was not as painful but still erythematous with a wound at the tip. We have been using silver alginate here 2/22; right posterior calf seems to be gradually epithelializing. Still a fairly substantial wound. He still has an area on the tip of his right fifth toe which I think was gout related. This is gradually improving. We have been using Unna boots to wrap X-ray I did of the foot last time was again negative there was soft tissue irregularity about the distal fifth digit but no radiographic evidence of bone damage 3/1; right posterior calf seems to be gradually improving however there were areas of hyper granulation. We have been using Hydrofera Blue. The hyper granulation is mostly evident in the most superior finger shape projection of the wound itself. ooThe area on the right fifth toe also was slough covered and required debridement. 3/8 continued improvement in the right posterior calf and the tip of the right fifth toe. We have been using Hydrofera Blue under compression he is changing the area to the toe 3/22; continued improvement in the right posterior calf. Unfortunately comes in today with a new skin tear in the mid anterior tibia area this probably had something to do with changing his dressing home health I called and left this a message last week. 3/29; continued improvement in the a substantial wound on the right posterior calf. The  skin tear anteriorly from last week is already healed on the tip of the right fifth toe there is still a nonviable area. 4/5; we have continued contraction of the substantial wound on the right posterior calf. We continue to have problems with the tip of the right fifth toe. We have been using Hydrofera Blue to both wound areas He is complaining about some discomfort in the Achilles area. Tells me he had surgery for what sounds like a torn Achilles about 10 years ago. 4/12; we have continued contraction of the substantial wound on the right posterior calf. ooStill an area on the tip of the right fifth toe. I changed him to silver collagen on the toe last week but I think home health continue to use Hydrofera Blue to both wound areas. 4/19; we continue to have contraction of the substantial wound on the right posterior calf however there continues to be hyper granulation. ooThe area on the tip of the fifth toe is very small still some debris on the surface we have been using silver call Objective Constitutional Patient is hypertensive.. Pulse regular and within target range for patient.Marland Kitchen Respirations regular, non-labored and within target range.. Temperature is normal and within the target range for the patient.Marland Kitchen Appears in no distress. Vitals Time Taken: 8:00 AM, Height: 67  in, Weight: 190 lbs, BMI: 29.8, Temperature: 98 F, Pulse: 52 bpm, Respiratory Rate: 19 breaths/min, Blood Pressure: 170/67 mmHg, Capillary Blood Glucose: 120 mg/dl. General Notes: patient stated CBG was 120 this morning General Notes: Wound exam ooRight fifth toe small area that is still open. There is debris on the surface but I did not debride this ooThe wound on the right posterior calf is contracted nicely. Hyper granulation. I used silver nitrate to cauterize this area hopefully to allow further epithelialization over a hyper granulated Integumentary (Hair, Skin) Wound #3 status is Open. Original cause of wound  was Trauma. The wound is located on the Right,Posterior Lower Leg. The wound measures 9cm length x 3.5cm width x 0.1cm depth; 24.74cm^2 area and 2.474cm^3 volume. There is Fat Layer (Subcutaneous Tissue) Exposed exposed. There is no tunneling or undermining noted. There is a medium amount of serosanguineous drainage noted. The wound margin is flat and intact. There is large (67-100%) red granulation within the wound bed. There is a small (1-33%) amount of necrotic tissue within the wound bed including Adherent Slough. Wound #5 status is Open. Original cause of wound was Blister. The wound is located on the Right Toe Fifth. The wound measures 0.3cm length x 0.3cm width x 0.1cm depth; 0.071cm^2 area and 0.007cm^3 volume. There is Fat Layer (Subcutaneous Tissue) Exposed exposed. There is no tunneling or undermining noted. There is a small amount of serous drainage noted. The wound margin is flat and intact. There is medium (34-66%) pink, pale granulation within the wound bed. There is a medium (34-66%) amount of necrotic tissue within the wound bed including Adherent Slough. 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 There was a Haematologist Compression Therapy Procedure by [name], RN. Post procedure Diagnosis Wound #3: Same as Pre-Procedure Pre-procedure diagnosis of Wound #3 is a Venous Leg Ulcer located on the Right,Posterior Lower Leg . An Chemical Cauterization procedure was performed by Ricard Dillon., MD. Post procedure Diagnosis Wound #3:  Same as Pre-Procedure Notes: silver nitrate Plan Follow-up Appointments: Return Appointment in 1 week. Dressing Change Frequency: Wound #3 Right,Posterior Lower Leg: Other: - twice a week - wound clinic to change 1x a week on Monday, home health to change 1x a week on Thursday or Friday Wound #5 Right Toe Fifth: Other: - twice a week - wound clinic to change 1x a week on Monday, home health to change 1x a week on Thursday or Friday Skin Barriers/Peri-Wound Care: Wound #3 Right,Posterior Lower Leg: Barrier cream Moisturizing lotion - to leg Wound Cleansing: Clean wound with Normal Saline. - or normal saline on days that dressing is changed May shower with protection. Primary Wound Dressing: Wound #3 Right,Posterior Lower Leg: Hydrofera Blue - Classic Wound #5 Right Toe Fifth: Silver Collagen - moisten with hydrogel or KY jelly Secondary Dressing: Wound #3 Right,Posterior Lower Leg: Foam - foam top bend of foot for protection ABD pad Zetuvit or Kerramax - or Xtrasorb Wound #5 Right Toe Fifth: Kerlix/Rolled Gauze Dry Gauze Other: - felt or rolled up gauze under toe to elevate tip of toe off of shoe Edema Control: [employer]  to Right Lower Extremity - *****ONLY ONE SINGLE LAYER OF UNNA BOOT***** - foam to achilles to pad Avoid standing for long periods of time Elevate legs to the level of the heart or above for 30 minutes daily and/or when sitting, a frequency of: - throughout the day Exercise regularly Additional Orders / Instructions: Other: - Go to ER if toe/leg gets more red/black, increased pain, fever Home Health: [place] [facility] for wound care. - [facility]. Still using silver collagen on the right fifth toe 2. Continuing Hydrofera Blue on the hyper granulated right posterior calf. Electronic Signature(s) Signed: 01/07/2020 5:34:04 PM By: [name] MD Entered By: [name] on 01/07/2020  09:06:58 -------------------------------------------------------------------------------- SuperBill Details Patient Name: Date of Service: AUDRICK, LAMOUREAUX 01/07/2020 Medical Record [id number] Patient Account Number: [id number] Date of Birth/Sex: Treating RN: [birth year] (73 y.o. 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 E11.622 Type 2 diabetes mellitus with other skin ulcer L97.518 Non-pressure chronic ulcer of other part of right foot with other specified severity Facility Procedures CPT4 Code Description: [id number] [place] - CHEM CAUT GRANULATION TISS ICD-10 Diagnosis Description L97.212 Non-pressure chronic ulcer of right calf with fat layer exposed Modifier: Quantity: 1 CPT4 Code Description: [id number] (Facility Use Only) [id number] - [place] COMPRS LWR RT LEG Modifier: 59 Quantity: 1 Physician Procedures CPT4 Code Description: [id number] - WC PHYS CHEM CAUT GRAN TISSUE ICD-10 Diagnosis Description T05.697 Non-pressure chronic ulcer of right calf with fat layer ex Modifier: posed Quantity: 1 Electronic Signature(s) Signed: 01/07/2020 5:34:04 PM By: [name] MD Signed: 01/07/2020 6:02:31 PM By: [name] RN, BSN Entered By: [name] on 01/07/2020 10:01:48

## 2020-01-14 ENCOUNTER — Other Ambulatory Visit: Payer: Self-pay

## 2020-01-14 ENCOUNTER — Encounter (HOSPITAL_BASED_OUTPATIENT_CLINIC_OR_DEPARTMENT_OTHER): Payer: Medicare Other | Admitting: Internal Medicine

## 2020-01-14 DIAGNOSIS — E11621 Type 2 diabetes mellitus with foot ulcer: Secondary | ICD-10-CM | POA: Diagnosis not present

## 2020-01-14 NOTE — Progress Notes (Signed)
Michael, [name] ([id number]) Visit Report for 01/14/2020 HPI Details Patient Name: Date of Service: [name], [name] 01/14/2020 10:30 AM Medical Record [id number] Patient Account Number: [id number] Date of Birth/Sex: Treating RN: 1945/12/31 (74 y.o. [name] Primary Care Provider: [name] Other Clinician: Referring Provider: Treating Provider/Extender:[name], [name], [name] in Treatment: 23 History of Present Illness HPI Description: [name] HPI Description: 74 year old gentleman who was recently seen by his nephrologist [name], and noted to have a wound on his left lower extremity which was lacerated 2 months ago and now has reopened. The patient's left shin has a ulceration with some exudate but no evidence of infection and he was referred to [place] for further care as it was known that the patient has had some peripheral vascular disease in the past. Past medical history significant for chronic kidney disease, atrial fibrillation, diabetes mellitus,status post kidney transplant in 1983 and 2005, a week fistula graft placement, status post previous bowel surgery. 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 10/06/2015 -- x-ray of the left tibia and fibula shows no evidence of osteomyelitis. The patient has also had Doppler studies of his extremity and is awaiting the appointment with the vascular surgeon. We have not yet received these reports. 10/13/2015 -- lower extremity venous duplex reflux evaluation shows reflux in the left common femoral vein, left saphenofemoral junction and the proximal greater saphenous vein extending to the proximal calf. There is also reflux in the left proximal to mid small saphenous vein. Arterial duplex studies done showed the resting ABI was not applicable due to tibial artery medial calcification. 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 The right TBI was 1 which is normal and the left ABI was 1 which is normal. Patient has otherwise been doing fine and has been compliant with his dressings. 10/20/2015 -- He was seen by [name] recently for a vascular opinion on 10/15/2015. His left lower extremity venous insufficiency duplex study revealed GSV reflux,SS vein reflux and deep venous reflux in the common femoral vein. His ABIs were non compressible and his TBI on the right was 1.01 and on the left was 0.80. He was asked to continue with the wound care with compressive therapy followed by EVLA of the left GS vein 3 months. He recommended 20-30 mm thigh-high compression stockings and the need for a three-month trial of this. The patient had an Unna boot applied at the vascular office but he could not tolerate this with a lot of pain and issues with his toes and hence came here on Friday for removal of this and we reapplied a 2 layer compression. 11/10/2015 -- patient still has not purchased his 20-30 mm thigh-high compression stockings as prescribed by [name]. Readmission: 08/08/18 on evaluation today patient presents for readmission concerning a new injury to the left anterior lower extremity. He was previously seen in 2017 here in our clinic. He states that he has done fairly well since that point. Nonetheless he is having at this time some pain but states that he hit this on a table that fell over and actually struck his leg. This appears to have pulled back some of his skin which folded in on itself and is causing some difficulty as far as that is concerned. There does not appear to be any evidence of  infection at this time. No fevers, chills, nausea, or vomiting noted at this time. He's been using dressings on his own currently without complication. 08/15/18 on evaluation today patient actually appears to be  doing somewhat better in regard to his wound of the lower [name] when compared to the first visit last week. I had to do a much more extensive debridement at that time it does appear that I'm gonna have to perform some debridement today but it does not look to be as extensive by any means. Nonetheless fortunately he does not show any signs of infection he does have discomfort at this site. I believe based on what I'm seeing currently he may benefit from Iodoflex to help keep the wound bed clean. Patient tolerated therapy without complication. Upon evaluation today the patient actually appears to be doing excellent in regard to his left lower extremity ulcer. This is much better than the previous two visits where he had a lot of necrotic tissue around the edge of the wound simply due to the fact that again there was a significant skin tear where the edge had been cleared away prior to reattaching and being able to heal appropriately. He seems to be doing much better at this point. 08/28/18 on evaluation today the patient's wound actually does appear to be showing signs of improvement. With that being said though he is improving he would likely note even greater improvement if we were able to sharply debride the wound. Nonetheless this caused him to much discomfort he tells me. 09/04/18 on evaluation today patient actually appears to be showing signs of improvement in regard to his left lower extremity ulcer. He has been tolerating the dressing changes including the wrap although he tells me at this point that the burning does last for a couple of days even with just the Iodoflex. I was afraid that this may been part of the issue that he was having with discomfort. It does seem to be the case. Nonetheless he shows no signs of evidence of infection at this time which is good news. No fevers chills noted [name] 10/05/2018 This is a patient who was cared for in 2017  and again in the fall of this year at our sister clinic and [facility]. He actually lives in [place] in [place]. We have been dealing with an apparent traumatic area on the left anterior tibial area. This is been present for the last several months. He was supposed to be using Iodoflex Kerlix and Coban however he was hospitalized from 09/05/2018 through 09/11/2018 with delirium secondary to pneumonia. Since then he is only been putting Vaseline gauze on this without compression. He also has a more recent skin tear on the dorsal right hand that may have only happened in the last week. The patient had arterial studies done in 2017 in January which was 3 years ago. At that point he had noncompressible ABIs but really quite good TBI's both normal. Triphasic waveforms on the right monophasic at the left posterior tibial but triphasic at the left dorsalis pedis. His ABIs in our clinic today were both noncompressible 1/23; the patient has wounds on the right dorsal hand just distal to the wrist and on the left anterior lower extremity. Both of these look very healthy he is using Hydrofera Blue 1/30; left anterior lower extremity wound much smaller. Healthy looking surface. The laceration area just distal to the wrist on the dorsal hand on the right is also just  about closed I used Hydrofera Blue here 2/6; left anterior lower extremity wound is much smaller but still open. The laceration area just distal to the wrist on the dorsal hand is fully epithelialized. 2/13; the patient's anterior lower extremity wound is closed. The laceration just distal to the wrist on the dorsal hand is also fully epithelialized and closed. The patient has external compression stockings which I think are 20/30 READMISSION 08/06/2019 Mr. Markell is a 74 year old man with had several times previously in our clinic. He is a diabetic with a history of chronic renal insufficiency status post kidney transplant in 1983 and again  in 2005. He was then in 2017 with a laceration on the left lower extremity. He was worked up at the time with arterial studies and reflux studies. The arterial studies showed ABIs to be noncompressible but TBI's were within normal limits. I do not have the reflux studies at the moment. He was also sent here in 2019 with a left lower extremity wound and then again in 2020 with left lower extremity trauma a skin tear on the wrist. He was discharged with 20/30 stockings identified from myself that that might not be enough compression. Nevertheless he states he was wearing these fairly reliably. In September he had a fall with a substantial bruise in the area of the wound. He says he saw orthopedics and they told him there was some muscle strain sometime it later this opened into a wound. He has a fairly substantial wound on the right posterior calf. Satellite areas around this including medially and posteriorly. He has not worn his stockings since the injury Past medical history; includes chronic renal failure secondary to diabetes with kidney transplant x2, atrial fibrillation, heart failure with preserved ejection fraction, coronary artery disease. ABIs on the right in our clinic were once again noncompressible 08/13/2019 on evaluation today patient appears to be doing decently well with regard to his wound compared to last week's evaluation. Unfortunately he is still having a lot of discomfort at this point which is I think in some part due to the 3 layer compression wrap which is a little bit stronger I think for him. When he was here before we actually utilized a Kerlix and Coban wrap which he states seemed to got a little bit better. Nonetheless I think we can probably drop back to this in light of the discomfort that he had. Nonetheless the pain was not really right around the wound itself as much as it was around the ankle in particular. The Iodoflex does seem to have done well for him As the  wound is appearing somewhat better today which is excellent news. 11/30; still complaining of a lot of pain. Apparently arterial studies I ordered 2 weeks ago are below. I do not believe we have an appointment with vein and vascular as of yet; ABI Findings: +---------+------------------+-----+----------+--------+ Right Rt Pressure (mmHg)IndexWaveform Comment  +---------+------------------+-----+----------+--------+ PTA >254 1.50 monophasic  +---------+------------------+-----+----------+--------+ DP >254 1.50 monophasic  +---------+------------------+-----+----------+--------+ Great Toe68 0.40 Abnormal   +---------+------------------+-----+----------+--------+ +---------+------------------+-----+----------+-------+ Left Lt Pressure (mmHg)IndexWaveform Comment +---------+------------------+-----+----------+-------+ Brachial 169     +---------+------------------+-----+----------+-------+ PTA >254 1.50 monophasic  +---------+------------------+-----+----------+-------+ DP >254 1.50 monophasic  +---------+------------------+-----+----------+-------+ Great Toe40 0.24 Abnormal   +---------+------------------+-----+----------+-------+ Pedal arteries appear hyperemic. Patient refused Brachial pressure in the right arm. Summary: Right: Resting right ankle-brachial index indicates noncompressible right lower extremity arteries. The right toe- brachial index is abnormal. Left: Resting left ankle-brachial index indicates noncompressible left lower extremity arteries. The left toe-brachial index is abnormal.  He is also having considerably more swelling in his left calf. This was not there when I last saw him 2 weeks ago. He tells me that some of the home health compression wraps have been slipping down and that may be the issue here however a month I am uncertain 70/2; sees vascular on December 22. Still complaining of a lot of pain. DVT study  I did last time was negative for DVT 12/14; still complaining of pain which if this is arterial is certainly claudication and rest keeps him uncomfortable at night. He has an appointment with vein and vascular on December 22. Wound surface is better using Iodoflex. Once the surface of this is satisfactory and we have exhausted the vascular route. Perhaps an advanced treatment option. He has a configuration of the venous ulceration although his arterial studies are not very good. The other issue is the patient has a transplanted kidney. This will make angiography difficult and challenging issue 12/21; still complaining of pain and drainage. We are using Iodoflex on the wound under compression. He sees Dr. Donnetta Hutching tomorrow to evaluate his noninvasive studies noted above. He has a transplanted kidney further complicating the options for angiography. 12/28; still using Iodoflex under compression. I have Dr. Luther Parody note from 12/22. he noted arterial studies revealing monophasic waveforms at the pedal vessels bilaterally and calcified vessels making the ABI unreliable. He did not comment on the reduced TBI's. He felt these were venous wounds based on the palpable dorsalis pedis pulse. 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 And they arranged for formal venous duplex with reflux studies in the next several weeks. Follow-up with either Scot Dock or Dr. Oneida Alar 09/24/2019 still using Iodoflex under compression. He has an appointment with Dr. Doren Custard on 1/7 with regards to his venous disease. The patient was not felt to have a primary arterial problem for the nonhealing of his wound. We did attempt to wrap him with 3 layer when he first came into the clinic he complained of a lot of pain in the ankle although this may have been because the dressing fell down somewhat. He has far too edema fluid in the right leg for a good prognosis about healing this wound He comes in today with an excoriation on the  bottom part of his right fifth toe. He thinks he may have done this taking off his clothes and that something got caught on the toe. There is no open wound however the toe itself is very painful 1/11; we are using Iodoflex under compression. The wound bed is clean. He went on to see Dr. Doren Custard on 09/27/2019 he again feels that the patient's arterial supply is adequate. He feels that he might benefit from right greater saphenous vein ablation for a venous ulcer. In the meantime the area that he was complaining about last week on the right fifth toe. An x-ray that I ordered showed marked soft tissue swelling along the distal aspect of the right foot but there was no evidence of osteomyelitis. He comes in today with the fifth toenail just about coming off. He has black eschar underneath the toe on the plantar aspect. The toe was swollen red and very painful. In the right setting this could be a significant soft tissue infection versus ischemia of the toe itself. He did not show this to Dr. Doren Custard 1/18; I am using Iodoflex under compression a large wound on the right posterior calf. He had his right greater saphenous vein ablation by Dr. Doren Custard  although I am not sure that is the only problem here. The right fifth toe which was possibly trauma 2 weeks ago continues to be exceptionally painful with a necrotic tip. Maybe not quite as swollen. I started him empirically on Augmentin 5/125 1 p.o. twice daily last week after discussing this with Dr. Loletha Grayer of nephrology. Perhaps somewhat better this week but not as good as I was hoping. A plain x-ray was negative. He comes in today with an area on the medial right calf that was blistered and now open. In looking at things he appears to be systemically fluid volume overloaded. He has a transplanted kidney. He states he takes his Lasix variably when he has appointments he tries not to take it the later I am really not certain if he takes this reliably. However he has  far too much edema fluid in the right leg to easily heal this wound and he appears to be developing blisters medially to form additional wounds 1/25; his CO2 angiogram was done by Dr. Doren Custard last week. This showed the proximal arteries all to be patent. On the right side the common femoral deep femoral superficial femoral popliteal anterior tibial and peroneal arteries were patent. The posterior tibial was occluded but reconstituted distally via collaterals from the peroneal artery he was felt he should have enough blood flow to heal his wounds including the right fifth toe. The right fifth toe looks better however he is still complaining of a lot of pain. The large area which is a venous ulceration posteriorly has a better looking surface I think we can switch to Hydrofera Blue today 2/1; the patient's original wounds on the right posterior medial calf has come down in width however superior to this he has new denuded areas and I am concerned we simply do not have enough edema control. He has already undergone a right greater saphenous ablation. He had a CO2 angiogram done by Dr. Doren Custard and the comment was that we should have enough blood flow to heal the venous wound however we simply do not seem to have enough edema control with 3 layer compression. The patient is status post kidney transplant although his exact renal function is not really clear. Nor am i sure what dose of diuretic he is supposed to be on. He also has the area on the right fifth toe which was unclear etiology but I think became secondarily infected I gave him 2 weeks of antibiotics for this and this seems to have settled down he still has a black eschar over the tip of the toe. X-ray was negative for fracture. He says he has a history of gout. 2/8; the patient's wound on the right posterior calf is about the same. The superficial area medially also about the same. He got a prescription for prednisoneo Gout after he developed  erythema on the dorsal aspect of his left great toe going along with the right fifth toe which has been problematic all along. He has not taken it because he is concerned about increasing CBGs. He has a transplanted kidney is already on prednisone 5 mg. He would not be a good candidate for NSAIDs. Perhaps colchicine. He is not aware of what his uric acid level is 2/15; his right posterior calf wound seems to be coming in in terms of width. Everything here looks fairly good. No mechanical debridement we have been using Hydrofera Blue. He has had an ablation by vein and vascular. Felt to have adequate arterial supply  to this area. The patient got prednisone last week from Dr. Angelique Holm of nephrology. At my urging he actually took it. The area on his left toe was a lot better. The right toe was not as painful but still erythematous with a wound at the tip. We have been using silver alginate here 2/22; right posterior calf seems to be gradually epithelializing. Still a fairly substantial wound. He still has an area on the tip of his right fifth toe which I think was gout related. This is gradually improving. We have been using Unna boots to wrap X-ray I did of the foot last time was again negative there was soft tissue irregularity about the distal fifth digit but no radiographic evidence of bone damage 3/1; right posterior calf seems to be gradually improving however there were areas of hyper granulation. We have been using Hydrofera Blue. The hyper granulation is mostly evident in the most superior finger shape projection of the wound itself. The area on the right fifth toe also was slough covered and required debridement. 3/8 continued improvement in the right posterior calf and the tip of the right fifth toe. We have been using Hydrofera Blue under compression he is changing the area to the toe 3/22; continued improvement in the right posterior calf. Unfortunately comes in today with a new  skin tear in the mid anterior tibia area this probably had something to do with changing his dressing home health I called and left this a message last week. 3/29; continued improvement in the a substantial wound on the right posterior calf. The skin tear anteriorly from last week is already healed on the tip of the right fifth toe there is still a nonviable area. 4/5; we have continued contraction of the substantial wound on the right posterior calf. We continue to have problems with the tip of the right fifth toe. We have been using Hydrofera Blue to both wound areas He is complaining about some discomfort in the Achilles area. Tells me he had surgery for what sounds like a torn Achilles about 10 years ago. 4/12; we have continued contraction of the substantial wound on the right posterior calf. Still an area on the tip of the right fifth toe. I changed him to silver collagen on the toe last week but I think home health continue to use Hydrofera Blue to both wound areas. 4/19; we continue to have contraction of the substantial wound on the right posterior calf however there continues to be hyper granulation. The area on the tip of the fifth toe is very small still some debris on the surface we have been using silver collagen 4/26; both wounds have contracted. I was hoping the fifth toe wound would close over but with probing there is still an open small hole here. Electronic Signature(s) Signed: 01/14/2020 5:40:04 PM By: [name] MD Entered By: [name] on 01/14/2020 11:58:02 -------------------------------------------------------------------------------- Physical Exam Details Patient Name: Date of Service: MOHD., DERFLINGER 01/14/2020 10:30 AM Medical Record [id number] Patient Account Number: [id number] Date of Birth/Sex: Treating RN: 01-12-1946 (74 y.o. [name] Primary Care Provider: [name] Other Clinician: Referring Provider: Treating  Provider/Extender:Aberdeen Hafen, [name], [name] in Treatment: 23 Constitutional Patient is hypertensive.. Pulse regular and within target range for patient.Marland Kitchen Respirations regular, non-labored and within target range.. Temperature is normal and within the target range for the patient.Marland Kitchen Appears in no distress. Cardiovascular Pedal pulses are palpable. We have good edema control. Notes Wound exam Right fifth small toe  still a small open area. This does not come together. The area which was his major wound on the right posterior calf is measuring smaller healthy looking tissue. Good epithelialization on the wound margins Electronic Signature(s) Signed: 01/14/2020 5:40:04 PM By: [name] MD Entered By: [name] on 01/14/2020 12:04:16 -------------------------------------------------------------------------------- Physician Orders Details Patient Name: Date of Service: TIMARION, AGCAOILI 01/14/2020 10:30 AM Medical Record [id number] Patient Account Number: [id number] Date of Birth/Sex: Treating RN: 1946/04/04 (73 y.o. [name] Primary Care Provider: [name] Other Clinician: Referring Provider: Treating Provider/Extender:Rebel Laughridge, [name], [name] in Treatment: 23 Verbal / Phone Orders: No Diagnosis Coding ICD-10 Coding Code Description S80.11XD Contusion of right lower leg, subsequent encounter L97.212 Non-pressure chronic ulcer of right calf with fat layer exposed I87.321 Chronic venous hypertension (idiopathic) with inflammation of right lower extremity E11.622 Type 2 diabetes mellitus with other skin ulcer L97.518 Non-pressure chronic ulcer of other part of right foot with other specified severity Follow-up Appointments Return Appointment in 1 week. Dressing Change Frequency Wound #3 Right,Posterior Lower Leg Other: - twice a week - wound clinic to change 1x a week on Monday, home health to change 1x a week  on Thursday or Friday Wound #5 Right Toe Fifth Other: - twice a week - wound clinic to change 1x a week on Monday, home health to change 1x a week on Thursday or Friday Skin Barriers/Peri-Wound Care Wound #3 Right,Posterior Lower Leg Barrier cream Moisturizing lotion - to leg Wound Cleansing Clean wound with Normal Saline. - or normal saline on days that dressing is changed May shower with protection. Primary Wound Dressing Wound #3 Right,Posterior Lower Leg Hydrofera Blue - Classic Wound #5 Right Toe Fifth Silver Collagen - moisten with hydrogel or KY jelly Secondary Dressing Wound #3 Right,Posterior Lower Leg Foam - foam top bend of foot for protection ABD pad Zetuvit or Kerramax - or Xtrasorb Wound #5 Right Toe Fifth Kerlix/Rolled Gauze Dry Gauze Other: - felt or rolled up gauze under toe to elevate tip of toe off of shoe Edema Control Unna Boot to Right Lower Extremity - *****ONLY ONE SINGLE LAYER OF UNNA BOOT***** - foam to achilles to pad Avoid standing for long periods of time Elevate legs to the level of the heart or above for 30 minutes daily and/or when sitting, a frequency of: - throughout the day Exercise regularly Additional Orders / Instructions Other: - Go to ER if toe/leg gets more red/black, increased pain, fever [facility] for wound care. - [facility] Electronic Signature(s) Signed: 01/14/2020 5:33:33 PM By: [name] RN, BSN Signed: 01/14/2020 5:40:04 PM By: [name] MD Entered By: [name] on 01/14/2020 10:07:42 -------------------------------------------------------------------------------- Problem List Details Patient Name: Date of Service: LEEMON, AYALA 01/14/2020 10:30 AM Medical Record [id number] Patient Account Number: [id number] Date of Birth/Sex: Treating RN: Aug 25, 1946 (73 y.o. [name] Primary Care Provider: [name] Other Clinician: Referring Provider:  Treating Provider/Extender:Latonya Knight, [name], [name] in Treatment: 23 Active Problems ICD-10 Encounter Code Description Active Date MDM Diagnosis S80.11XD Contusion of right lower leg, subsequent encounter 08/06/2019 No Yes L97.212 Non-pressure chronic ulcer of right calf with fat layer 08/06/2019 No Yes exposed I87.321 Chronic venous hypertension (idiopathic) with 08/06/2019 No Yes inflammation of right lower extremity E11.622 Type 2 diabetes mellitus with other skin ulcer 08/06/2019 No Yes L97.518 Non-pressure chronic ulcer of other part of right foot with 10/01/2019 No Yes other specified severity Inactive Problems ICD-10 Code Description Active Date  Inactive Date L97.811 Non-pressure chronic ulcer of other part of right lower leg limited 12/10/2019 12/10/2019 to breakdown of skin Resolved Problems Electronic Signature(s) Signed: 01/14/2020 5:40:04 PM By: [name] MD Entered By: [name] on 01/14/2020 11:55:32 -------------------------------------------------------------------------------- Progress Note Details Patient Name: Date of Service: FRANKLIN, BAUMBACH 01/14/2020 10:30 AM Medical Record [id number] Patient Account Number: [id number] Date of Birth/Sex: Treating RN: 28-Sep-1945 (73 y.o. [name] Primary Care Provider: [name] Other Clinician: Referring Provider: Treating Provider/Extender:Ehsan Corvin, [name], [name] in Treatment: 23 Subjective History of Present Illness (HPI) [place] HPI Description: 74 year old gentleman who was recently seen by his nephrologist [name], and noted to have a wound on his left lower extremity which was lacerated 2 months ago and now has reopened. The patient's left shin has a ulceration with some exudate but no evidence of infection and he was referred to [place] for further care as it was known that the patient has had some peripheral vascular disease in  the past. Past medical history significant for chronic kidney disease, atrial fibrillation, diabetes mellitus,status post kidney transplant in 1983 and 2005, a week fistula graft placement, status post previous bowel surgery. 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 10/06/2015 -- x-ray of the left tibia and fibula shows no evidence of osteomyelitis. The patient has also had Doppler studies of his extremity and is awaiting the appointment with the vascular surgeon. We have not yet received these reports. 10/13/2015 -- lower extremity venous duplex reflux evaluation shows reflux in the left common femoral vein, left saphenofemoral junction and the proximal greater saphenous vein extending to the proximal calf. There is also reflux in the left proximal to mid small saphenous vein. Arterial duplex studies done showed the resting ABI was not applicable due to tibial artery medial calcification. 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 The right TBI was 1 which is normal and the left ABI was 1 which is normal. Patient has otherwise been doing fine and has been compliant with his dressings. 10/20/2015 -- He was seen by [name] recently for a vascular opinion on 10/15/2015. His left lower extremity venous insufficiency duplex study revealed GSV reflux,SS vein reflux and deep venous reflux in the common femoral vein. His ABIs were non compressible and his TBI on the right was 1.01 and on the left was 0.80. He was asked to continue with the wound care with compressive therapy followed by EVLA of the left GS vein 3 months. He recommended 20-30 mm thigh-high compression stockings and the need for a three-month trial of this. The patient had an Unna boot applied at the vascular office but he could not tolerate this with a lot of pain and  issues with his toes and hence came here on Friday for removal of this and we reapplied a 2 layer compression. 11/10/2015 -- patient still has not purchased his 20-30 mm thigh-high compression stockings as prescribed by [name]. Readmission: 08/08/18 on evaluation today patient presents for readmission concerning a new injury to the left anterior lower extremity. He was previously seen in 2017 here in our clinic. He states that he has done fairly well since that point. Nonetheless he is having at this time some pain but states that he hit this on a table that fell over and actually struck his leg.  This appears to have pulled back some of his skin which folded in on itself and is causing some difficulty as far as that is concerned. There does not appear to be any evidence of infection at this time. No fevers, chills, nausea, or vomiting noted at this time. He's been using dressings on his own currently without complication. 08/15/18 on evaluation today patient actually appears to be doing somewhat better in regard to his wound of the lower [name] when compared to the first visit last week. I had to do a much more extensive debridement at that time it does appear that I'm gonna have to perform some debridement today but it does not look to be as extensive by any means. Nonetheless fortunately he does not show any signs of infection he does have discomfort at this site. I believe based on what I'm seeing currently he may benefit from Iodoflex to help keep the wound bed clean. Patient tolerated therapy without complication. Upon evaluation today the patient actually appears to be doing excellent in regard to his left lower extremity ulcer. This is much better than the previous two visits where he had a lot of necrotic tissue around the edge of the wound simply due to the fact that again there was a significant skin tear where the edge had been cleared away prior to reattaching and being able to heal  appropriately. He seems to be doing much better at this point. 08/28/18 on evaluation today the patient's wound actually does appear to be showing signs of improvement. With that being said though he is improving he would likely note even greater improvement if we were able to sharply debride the wound. Nonetheless this caused him to much discomfort he tells me. 09/04/18 on evaluation today patient actually appears to be showing signs of improvement in regard to his left lower extremity ulcer. He has been tolerating the dressing changes including the wrap although he tells me at this point that the burning does last for a couple of days even with just the Iodoflex. I was afraid that this may been part of the issue that he was having with discomfort. It does seem to be the case. Nonetheless he shows no signs of evidence of infection at this time which is good news. No fevers chills noted [name]05/2018 This is a patient who was cared for in 2017 and again in the fall of this year at our sister clinic and [facility]. He actually lives in [place] in [place]. We have been dealing with an apparent traumatic area on the left anterior tibial area. This is been present for the last several months. He was supposed to be using Iodoflex Kerlix and Coban however he was hospitalized from 09/05/2018 through 09/11/2018 with delirium secondary to pneumonia. Since then he is only been putting Vaseline gauze on this without compression. He also has a more recent skin tear on the dorsal right hand that may have only happened in the last week. The patient had arterial studies done in 2017 in January which was 3 years ago. At that point he had noncompressible ABIs but really quite good TBI's both normal. Triphasic waveforms on the right monophasic at the left posterior tibial but triphasic at the left dorsalis pedis. His ABIs in our clinic today were both noncompressible 1/23;  the patient has wounds on the right dorsal hand just distal to the wrist and on the left anterior lower extremity. Both of these look very  healthy he is using Hydrofera Blue 1/30; left anterior lower extremity wound much smaller. Healthy looking surface. The laceration area just distal to the wrist on the dorsal hand on the right is also just about closed I used Hydrofera Blue here 2/6; left anterior lower extremity wound is much smaller but still open. The laceration area just distal to the wrist on the dorsal hand is fully epithelialized. 2/13; the patient's anterior lower extremity wound is closed. The laceration just distal to the wrist on the dorsal hand is also fully epithelialized and closed. The patient has external compression stockings which I think are 20/30 READMISSION 08/06/2019 Mr. Monacelli is a 74 year old man with had several times previously in our clinic. He is a diabetic with a history of chronic renal insufficiency status post kidney transplant in 1983 and again in 2005. He was then in 2017 with a laceration on the left lower extremity. He was worked up at the time with arterial studies and reflux studies. The arterial studies showed ABIs to be noncompressible but TBI's were within normal limits. I do not have the reflux studies at the moment. He was also sent here in 2019 with a left lower extremity wound and then again in 2020 with left lower extremity trauma a skin tear on the wrist. He was discharged with 20/30 stockings identified from myself that that might not be enough compression. Nevertheless he states he was wearing these fairly reliably. In September he had a fall with a substantial bruise in the area of the wound. He says he saw orthopedics and they told him there was some muscle strain sometime it later this opened into a wound. He has a fairly substantial wound on the right posterior calf. Satellite areas around this including medially and posteriorly. He has not  worn his stockings since the injury Past medical history; includes chronic renal failure secondary to diabetes with kidney transplant x2, atrial fibrillation, heart failure with preserved ejection fraction, coronary artery disease. ABIs on the right in our clinic were once again noncompressible 08/13/2019 on evaluation today patient appears to be doing decently well with regard to his wound compared to last week's evaluation. Unfortunately he is still having a lot of discomfort at this point which is I think in some part due to the 3 layer compression wrap which is a little bit stronger I think for him. When he was here before we actually utilized a Kerlix and Coban wrap which he states seemed to got a little bit better. Nonetheless I think we can probably drop back to this in light of the discomfort that he had. Nonetheless the pain was not really right around the wound itself as much as it was around the ankle in particular. The Iodoflex does seem to have done well for him As the wound is appearing somewhat better today which is excellent news. 11/30; still complaining of a lot of pain. Apparently arterial studies I ordered 2 weeks ago are below. I do not believe we have an appointment with vein and vascular as of yet; ABI Findings: +---------+------------------+-----+----------+--------+ Right Rt Pressure (mmHg)IndexWaveform Comment  +---------+------------------+-----+----------+--------+ PTA >254 1.50 monophasic  +---------+------------------+-----+----------+--------+ DP >254 1.50 monophasic  +---------+------------------+-----+----------+--------+ Great Toe68 0.40 Abnormal   +---------+------------------+-----+----------+--------+ +---------+------------------+-----+----------+-------+ Left Lt Pressure (mmHg)IndexWaveform Comment +---------+------------------+-----+----------+-------+ Brachial 169      +---------+------------------+-----+----------+-------+ PTA >254 1.50 monophasic  +---------+------------------+-----+----------+-------+ DP >254 1.50 monophasic  +---------+------------------+-----+----------+-------+ Great Toe40 0.24 Abnormal   +---------+------------------+-----+----------+-------+ Pedal arteries appear hyperemic. Patient refused Brachial pressure in the right arm. Summary:  Right: Resting right ankle-brachial index indicates noncompressible right lower extremity arteries. The right toe- brachial index is abnormal. Left: Resting left ankle-brachial index indicates noncompressible left lower extremity arteries. The left toe-brachial index is abnormal. He is also having considerably more swelling in his left calf. This was not there when I last saw him 2 weeks ago. He tells me that some of the home health compression wraps have been slipping down and that may be the issue here however a month I am uncertain 46/6; sees vascular on December 22. Still complaining of a lot of pain. DVT study I did last time was negative for DVT 12/14; still complaining of pain which if this is arterial is certainly claudication and rest keeps him uncomfortable at night. He has an appointment with vein and vascular on December 22. Wound surface is better using Iodoflex. Once the surface of this is satisfactory and we have exhausted the vascular route. Perhaps an advanced treatment option. He has a configuration of the venous ulceration although his arterial studies are not very good. The other issue is the patient has a transplanted kidney. This will make angiography difficult and challenging issue 12/21; still complaining of pain and drainage. We are using Iodoflex on the wound under compression. He sees Dr. Donnetta Hutching tomorrow to evaluate his noninvasive studies noted above. He has a transplanted kidney further complicating the options for angiography. 12/28; still using  Iodoflex under compression. I have Dr. Luther Parody note from 12/22. he noted arterial studies revealing monophasic waveforms at the pedal vessels bilaterally and calcified vessels making the ABI unreliable. He did not comment on the reduced TBI's. He felt these were venous wounds based on the palpable dorsalis pedis pulse. 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 And they arranged for formal venous duplex with reflux studies in the next several weeks. Follow-up with either Scot Dock or Dr. Oneida Alar 09/24/2019 still using Iodoflex under compression. He has an appointment with Dr. Doren Custard on 1/7 with regards to his venous disease. The patient was not felt to have a primary arterial problem for the nonhealing of his wound. We did attempt to wrap him with 3 layer when he first came into the clinic he complained of a lot of pain in the ankle although this may have been because the dressing fell down somewhat. He has far too edema fluid in the right leg for a good prognosis about healing this wound He comes in today with an excoriation on the bottom part of his right fifth toe. He thinks he may have done this taking off his clothes and that something got caught on the toe. There is no open wound however the toe itself is very painful 1/11; we are using Iodoflex under compression. The wound bed is clean. He went on to see Dr. Doren Custard on 09/27/2019 he again feels that the patient's arterial supply is adequate. He feels that he might benefit from right greater saphenous vein ablation for a venous ulcer. In the meantime the area that he was complaining about last week on the right fifth toe. An x-ray that I ordered showed marked soft tissue swelling along the distal aspect of the right foot but there was no evidence of osteomyelitis. He comes in today with the fifth toenail just about coming off. He has black eschar underneath the toe on the plantar aspect. The toe was swollen red and very painful. In the right  setting this could be a significant soft tissue infection versus ischemia of the toe  itself. He did not show this to Dr. Doren Custard 1/18; I am using Iodoflex under compression a large wound on the right posterior calf. He had his right greater saphenous vein ablation by Dr. Doren Custard although I am not sure that is the only problem here. ooThe right fifth toe which was possibly trauma 2 weeks ago continues to be exceptionally painful with a necrotic tip. Maybe not quite as swollen. I started him empirically on Augmentin 5/125 1 p.o. twice daily last week after discussing this with Dr. Loletha Grayer of nephrology. Perhaps somewhat better this week but not as good as I was hoping. A plain x-ray was negative. He comes in today with an area on the medial right calf that was blistered and now open. In looking at things he appears to be systemically fluid volume overloaded. He has a transplanted kidney. He states he takes his Lasix variably when he has appointments he tries not to take it the later I am really not certain if he takes this reliably. However he has far too much edema fluid in the right leg to easily heal this wound and he appears to be developing blisters medially to form additional wounds 1/25; his CO2 angiogram was done by Dr. Doren Custard last week. This showed the proximal arteries all to be patent. On the right side the common femoral deep femoral superficial femoral popliteal anterior tibial and peroneal arteries were patent. The posterior tibial was occluded but reconstituted distally via collaterals from the peroneal artery he was felt he should have enough blood flow to heal his wounds including the right fifth toe. The right fifth toe looks better however he is still complaining of a lot of pain. The large area which is a venous ulceration posteriorly has a better looking surface I think we can switch to Hydrofera Blue today 2/1; the patient's original wounds on the right posterior medial calf has come  down in width however superior to this he has new denuded areas and I am concerned we simply do not have enough edema control. He has already undergone a right greater saphenous ablation. He had a CO2 angiogram done by Dr. Doren Custard and the comment was that we should have enough blood flow to heal the venous wound however we simply do not seem to have enough edema control with 3 layer compression. The patient is status post kidney transplant although his exact renal function is not really clear. Nor am i sure what dose of diuretic he is supposed to be on. He also has the area on the right fifth toe which was unclear etiology but I think became secondarily infected I gave him 2 weeks of antibiotics for this and this seems to have settled down he still has a black eschar over the tip of the toe. X-ray was negative for fracture. He says he has a history of gout. 2/8; the patient's wound on the right posterior calf is about the same. The superficial area medially also about the same. He got a prescription for prednisoneo Gout after he developed erythema on the dorsal aspect of his left great toe going along with the right fifth toe which has been problematic all along. He has not taken it because he is concerned about increasing CBGs. He has a transplanted kidney is already on prednisone 5 mg. He would not be a good candidate for NSAIDs. Perhaps colchicine. He is not aware of what his uric acid level is 2/15; his right posterior calf wound seems to be coming  in in terms of width. Everything here looks fairly good. No mechanical debridement we have been using Hydrofera Blue. He has had an ablation by vein and vascular. Felt to have adequate arterial supply to this area. The patient got prednisone last week from Dr. Angelique Holm of nephrology. At my urging he actually took it. The area on his left toe was a lot better. The right toe was not as painful but still erythematous with a wound at the tip. We have  been using silver alginate here 2/22; right posterior calf seems to be gradually epithelializing. Still a fairly substantial wound. He still has an area on the tip of his right fifth toe which I think was gout related. This is gradually improving. We have been using Unna boots to wrap X-ray I did of the foot last time was again negative there was soft tissue irregularity about the distal fifth digit but no radiographic evidence of bone damage 3/1; right posterior calf seems to be gradually improving however there were areas of hyper granulation. We have been using Hydrofera Blue. The hyper granulation is mostly evident in the most superior finger shape projection of the wound itself. ooThe area on the right fifth toe also was slough covered and required debridement. 3/8 continued improvement in the right posterior calf and the tip of the right fifth toe. We have been using Hydrofera Blue under compression he is changing the area to the toe 3/22; continued improvement in the right posterior calf. Unfortunately comes in today with a new skin tear in the mid anterior tibia area this probably had something to do with changing his dressing home health I called and left this a message last week. 3/29; continued improvement in the a substantial wound on the right posterior calf. The skin tear anteriorly from last week is already healed on the tip of the right fifth toe there is still a nonviable area. 4/5; we have continued contraction of the substantial wound on the right posterior calf. We continue to have problems with the tip of the right fifth toe. We have been using Hydrofera Blue to both wound areas He is complaining about some discomfort in the Achilles area. Tells me he had surgery for what sounds like a torn Achilles about 10 years ago. 4/12; we have continued contraction of the substantial wound on the right posterior calf. ooStill an area on the tip of the right fifth toe. I changed him  to silver collagen on the toe last week but I think home health continue to use Hydrofera Blue to both wound areas. 4/19; we continue to have contraction of the substantial wound on the right posterior calf however there continues to be hyper granulation. ooThe area on the tip of the fifth toe is very small still some debris on the surface we have been using silver collagen 4/26; both wounds have contracted. I was hoping the fifth toe wound would close over but with probing there is still an open small hole here. Objective Constitutional Patient is hypertensive.. Pulse regular and within target range for patient.Marland Kitchen Respirations regular, non-labored and within target range.. Temperature is normal and within the target range for the patient.Marland Kitchen Appears in no distress. Vitals Time Taken: 10:10 AM, Height: 67 in, Weight: 190 lbs, BMI: 29.8, Temperature: 97.6 F, Pulse: 52 bpm, Respiratory Rate: 19 breaths/min, Blood Pressure: 147/68 mmHg. Cardiovascular Pedal pulses are palpable. We have good edema control. General Notes: Wound exam ooRight fifth small toe still a small open area. This  does not come together. ooThe area which was his major wound on the right posterior calf is measuring smaller healthy looking tissue. Good epithelialization on the wound margins Integumentary (Hair, Skin) Wound #3 status is Open. Original cause of wound was Trauma. The wound is located on the Right,Posterior Lower Leg. The wound measures 6.4cm length x 3cm width x 0.1cm depth; 15.08cm^2 area and 1.508cm^3 volume. There is Fat Layer (Subcutaneous Tissue) Exposed exposed. There is no tunneling or undermining noted. There is a medium amount of serosanguineous drainage noted. The wound margin is flat and intact. There is large (67-100%) red granulation within the wound bed. There is a small (1-33%) amount of necrotic tissue within the wound bed including Adherent Slough. Wound #5 status is Open. Original cause of  wound was Blister. The wound is located on the Right Toe Fifth. The wound measures 0.2cm length x 0.2cm width x 0.1cm depth; 0.031cm^2 area and 0.003cm^3 volume. There is Fat Layer (Subcutaneous Tissue) Exposed exposed. There is no tunneling or undermining noted. There is a small amount of serous drainage noted. The wound margin is flat and intact. There is medium (34-66%) pink, pale granulation within the wound bed. There is a medium (34-66%) amount of necrotic tissue within the wound bed including Adherent Slough. 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 There was a Haematologist Compression Therapy Procedure by [name], RN. Post procedure Diagnosis Wound #3: Same as Pre-Procedure Plan Follow-up Appointments: Return Appointment in 1 week. Dressing Change Frequency: Wound #3 Right,Posterior Lower Leg: Other: - twice a week - wound clinic to change 1x a week on Monday, home health to change 1x a week on Thursday or Friday Wound #5 Right Toe Fifth: Other: - twice a week - wound clinic to change 1x a week on Monday, home health to change 1x a week on Thursday or Friday Skin Barriers/Peri-Wound Care: Wound #3 Right,Posterior Lower Leg: Barrier cream Moisturizing lotion - to leg Wound Cleansing: Clean wound with Normal Saline. - or normal saline on days that dressing is changed May shower with protection. Primary Wound Dressing: Wound #3 Right,Posterior Lower Leg: Hydrofera Blue - Classic Wound #5 Right Toe Fifth: Silver Collagen - moisten  with hydrogel or KY jelly Secondary Dressing: Wound #3 Right,Posterior Lower Leg: Foam - foam top bend of foot for protection ABD pad Zetuvit or Kerramax - or Xtrasorb Wound #5 Right Toe Fifth: Kerlix/Rolled Gauze Dry Gauze Other: - felt or rolled up gauze under toe to elevate tip of toe off of shoe Edema Control: Unna Boot to Right Lower Extremity - *****ONLY ONE SINGLE LAYER OF UNNA BOOT***** - foam to achilles to pad Avoid standing for long periods of time Elevate legs to the level of the heart or above for 30 minutes daily and/or when sitting, a frequency of: - throughout the day Exercise regularly Additional Orders / Instructions: Other: - Go to ER if toe/leg gets more red/black, increased pain, fever Home Health: [facility] for wound care. - [facility] 1. I am continue with silver collagen on the right fifth toe and Hydrofera Blue on the right calf  2. If the toe is not closed by next week I may need to change. He has home health. I would consider Iodoflex to see if I can pull together this area Electronic Signature(s) Signed: 01/14/2020 5:40:04 PM By: [name] MD Entered By: [name] on 01/14/2020 12:04:57 -------------------------------------------------------------------------------- SuperBill Details Patient Name: Date of Service: THELMER, LEGLER 01/14/2020 Medical Record [id number] Patient Account Number: [id number] Date of Birth/Sex: Treating RN: [birth year] (73 y.o. [name] Primary Care Provider: [name] Other Clinician: Referring Provider: Treating Provider/Extender:Kurtiss Wence, [name], [name] in Treatment: 23 Diagnosis Coding ICD-10 Codes Code Description S80.11XD Contusion of right lower leg, subsequent encounter L97.212 Non-pressure chronic ulcer of right calf with fat layer exposed I87.321 Chronic venous hypertension (idiopathic) with inflammation of right lower  extremity E11.622 Type 2 diabetes mellitus with other skin ulcer L97.518 Non-pressure chronic ulcer of other part of right foot with other specified severity Facility Procedures CPT4 Code: 84665993 Description: (Facility Use Only) 57017BL - APPLY Louretta Parma BOOT RT Modifier: Quantity: 1 Physician Procedures CPT4 Code Description: 3903009 [id number] - WC PHYS LEVEL 3 - EST PT ICD-10 Diagnosis Description L97.212 Non-pressure chronic ulcer of right calf with fat layer exp L97.518 Non-pressure chronic ulcer of other part of right foot with Modifier: osed other specified Quantity: 1 severity Electronic Signature(s) Signed: 01/14/2020 5:40:04 PM By: [name] MD Entered By: [name] on 01/14/2020 12:05:23

## 2020-01-21 ENCOUNTER — Encounter (HOSPITAL_BASED_OUTPATIENT_CLINIC_OR_DEPARTMENT_OTHER): Payer: Medicare Other | Attending: Internal Medicine | Admitting: Internal Medicine

## 2020-01-21 DIAGNOSIS — E11621 Type 2 diabetes mellitus with foot ulcer: Secondary | ICD-10-CM | POA: Insufficient documentation

## 2020-01-21 DIAGNOSIS — Z940 Kidney transplant status: Secondary | ICD-10-CM | POA: Diagnosis not present

## 2020-01-21 DIAGNOSIS — I87321 Chronic venous hypertension (idiopathic) with inflammation of right lower extremity: Secondary | ICD-10-CM | POA: Diagnosis not present

## 2020-01-21 DIAGNOSIS — L97518 Non-pressure chronic ulcer of other part of right foot with other specified severity: Secondary | ICD-10-CM | POA: Diagnosis not present

## 2020-01-21 DIAGNOSIS — Z7952 Long term (current) use of systemic steroids: Secondary | ICD-10-CM | POA: Diagnosis not present

## 2020-01-21 DIAGNOSIS — L97212 Non-pressure chronic ulcer of right calf with fat layer exposed: Secondary | ICD-10-CM | POA: Diagnosis not present

## 2020-01-21 DIAGNOSIS — W228XXA Striking against or struck by other objects, initial encounter: Secondary | ICD-10-CM | POA: Insufficient documentation

## 2020-01-21 DIAGNOSIS — S41111A Laceration without foreign body of right upper arm, initial encounter: Secondary | ICD-10-CM | POA: Insufficient documentation

## 2020-01-21 DIAGNOSIS — I2510 Atherosclerotic heart disease of native coronary artery without angina pectoris: Secondary | ICD-10-CM | POA: Diagnosis not present

## 2020-01-21 DIAGNOSIS — I509 Heart failure, unspecified: Secondary | ICD-10-CM | POA: Diagnosis not present

## 2020-01-21 DIAGNOSIS — N189 Chronic kidney disease, unspecified: Secondary | ICD-10-CM | POA: Insufficient documentation

## 2020-01-21 DIAGNOSIS — E1122 Type 2 diabetes mellitus with diabetic chronic kidney disease: Secondary | ICD-10-CM | POA: Diagnosis not present

## 2020-01-21 NOTE — Progress Notes (Signed)
[name], [name] ([id number]) Visit Report for 01/21/2020 Arrival Information Details Patient Name: Date of Service: [name], [name] 01/21/2020 8:15 A M Medical Record Number: [id number] Patient Account Number: [id number] Date of Birth/Sex: Treating RN: [birth year] (73 y.o. [name]) Carlene Coria Primary Care Hobie Kohles: [name] Other Clinician: Referring Abdalrahman Clementson: Treating Kesi Perrow/Extender: [name] in Treatment: 24 Visit Information History Since Last Visit All ordered tests and consults were completed: No Patient Arrived: Ambulatory Added or deleted any medications: No Arrival Time: 08:17 Any new allergies or adverse reactions: No Accompanied By: self Had a fall or experienced change in No Transfer Assistance: None activities of daily living that may affect Patient Identification Verified: Yes risk of falls: Secondary Verification Process Completed: Yes Signs or symptoms of abuse/neglect since last visito No Patient Requires Transmission-Based Precautions: No Hospitalized since last visit: No Patient Has Alerts: Yes Implantable device outside of the clinic excluding No Patient Alerts: non compressable cellular tissue based products placed in the center since last visit: Has Dressing in Place as Prescribed: Yes Pain Present Now: Yes Electronic Signature(s) Signed: 01/21/2020 5:02:18 PM By: Carlene Coria RN Entered By: Carlene Coria on 01/21/2020 08:18:10 -------------------------------------------------------------------------------- Compression Therapy Details Patient Name: Date of Service: [name], [name] HA M 01/21/2020 8:15 A M Medical Record Number: [id number] Patient Account Number: [id number] Date of Birth/Sex: Treating RN: 20-Jun-1946 (74 y.o. [name] Primary Care Alisea Matte: [name] Other Clinician: Referring Wyonia Fontanella: Treating Dracen Reigle/Extender: [name] in Treatment:  24 Compression Therapy Performed for Wound Assessment: Wound #3 Right,Posterior Lower Leg Performed By: Clinician [name], RN Compression Type: Rolena Infante Post Procedure Diagnosis Same as Pre-procedure Electronic Signature(s) Signed: 01/21/2020 5:27:06 PM By: [name] RN, BSN Entered By: [name] on 01/21/2020 08:52:05 -------------------------------------------------------------------------------- Encounter Discharge Information Details Patient Name: Date of Service: [name], [name] HA M 01/21/2020 8:15 A M Medical Record Number: [id number] Patient Account Number: [id number] Date of Birth/Sex: Treating RN: [birth year] (74 y.o. [name] Primary Care Padraig Nhan: [name] Other Clinician: Referring Jemimah Cressy: Treating Tamar Lipscomb/Extender: [name] in Treatment: 24 Encounter Discharge Information Items Post Procedure Vitals Discharge Condition: Stable Temperature (F): 98.3 Ambulatory Status: Ambulatory Pulse (bpm): 52 Discharge Destination: Home Respiratory Rate (breaths/min): 18 Transportation: Private Auto Blood Pressure (mmHg): 165/92 Accompanied By: self Schedule Follow-up Appointment: Yes Clinical Summary of Care: Electronic Signature(s) Signed: 01/21/2020 4:26:09 PM By: [name] Entered By: [name] on 01/21/2020 09:18:16 -------------------------------------------------------------------------------- Lower Extremity Assessment Details Patient Name: Date of Service: [name], [name] 01/21/2020 8:15 A M Medical Record Number: [id number] Patient Account Number: [id number] Date of Birth/Sex: Treating RN: April 21, 1946 (73 y.o. [name]) Carlene Coria Primary Care Xan Sparkman: [name] Other Clinician: Referring Ronesha Heenan: Treating Alexander Mcauley/Extender: [name] in Treatment: 24 Edema Assessment Assessed: Shirlyn Goltz: No] [Right: No] Edema: [Left: Ye] [Right:  s] Calf Left: Right: Point of Measurement: 30 cm From Medial Instep cm 36 cm Ankle Left: Right: Point of Measurement: 10 cm From Medial Instep cm 23.5 cm Electronic Signature(s) Signed: 01/21/2020 5:02:18 PM By: Carlene Coria RN Entered By: Carlene Coria on 01/21/2020 08:24:35 -------------------------------------------------------------------------------- Multi Wound Chart Details Patient Name: Date of Service: [name], [name] HA M 01/21/2020 8:15 A M Medical Record Number: [id number] Patient Account Number: [id number] Date of Birth/Sex: Treating RN: July 31, 1946 (74 y.o. [name] Primary Care Fahd Galea: [name] Other Clinician: Referring Vennie Salsbury: Treating Ellison Leisure/Extender: [name]  in Treatment: 24 Vital Signs Height(in): 67 Pulse(bpm): 52 Weight(lbs): 190 Blood Pressure(mmHg): 165/92 Body Mass Index(BMI): 30 Temperature(F): 98.3 Respiratory Rate(breaths/min): 18 Photos: [3:No Photos Right, Posterior Lower Leg] [5:No Photos Right T Fifth oe] [N/A:N/A N/A] Wound Location: [3:Trauma] [5:Blister] [N/A:N/A] Wounding Event: [3:Venous Leg Ulcer] [5:Diabetic Wound/Ulcer of the Lower] [N/A:N/A] Primary Etiology: [3:Cataracts, Anemia, Arrhythmia,] [5:Extremity Cataracts, Anemia, Arrhythmia,] [N/A:N/A] Comorbid History: [3:Congestive Heart Failure, Hypertension, Type II Diabetes, Gout, Hypertension, Type II Diabetes, Gout, Neuropathy 06/06/2019] [5:Congestive Heart Failure, Neuropathy 09/19/2019] [N/A:N/A] Date Acquired: [3:24] [5:17] [N/A:N/A] Weeks of Treatment: [3:Open] [5:Open] [N/A:N/A] Wound Status: [3:6.1x2.7x0.1] [5:0.2x0.2x0.1] [N/A:N/A] Measurements L x W x D (cm) [3:12.936] [5:0.031] [N/A:N/A] A (cm) : rea [3:1.294] [5:0.003] [N/A:N/A] Volume (cm) : [3:42.60%] [5:94.70%] [N/A:N/A] % Reduction in A [3:rea: 80.90%] [5:94.90%] [N/A:N/A] % Reduction in Volume: [3:Full Thickness Without Exposed] [5:Grade 2]  [N/A:N/A] Classification: [3:Support Structures Medium] [5:Small] [N/A:N/A] Exudate A mount: [3:Serosanguineous] [5:Serous] [N/A:N/A] Exudate Type: [3:red, brown] [5:amber] [N/A:N/A] Exudate Color: [3:Flat and Intact] [5:Flat and Intact] [N/A:N/A] Wound Margin: [3:Large (67-100%)] [5:None Present (0%)] [N/A:N/A] Granulation A mount: [3:Red] [5:N/A] [N/A:N/A] Granulation Quality: [3:Small (1-33%)] [5:Large (67-100%)] [N/A:N/A] Necrotic A mount: [3:Fat Layer (Subcutaneous Tissue)] [5:Fascia: No] [N/A:N/A] Exposed Structures: [3:Exposed: Yes Fascia: No Tendon: No Muscle: No Joint: No Bone: No Medium (34-66%)] [5:Fat Layer (Subcutaneous Tissue) Exposed: No Tendon: No Muscle: No Joint: No Bone: No Medium (34-66%)] [N/A:N/A] Epithelialization: [3:N/A] [5:Debridement - Excisional] [N/A:N/A] Debridement: Pre-procedure Verification/Time Out N/A [5:08:48] [N/A:N/A] Taken: [3:N/A] [5:Lidocaine 5% topical ointment] [N/A:N/A] Pain Control: [3:N/A] [5:Subcutaneous] [N/A:N/A] Tissue Debrided: [3:N/A] [5:Skin/Subcutaneous Tissue] [N/A:N/A] Level: [3:N/A] [5:0.04] [N/A:N/A] Debridement A (sq cm): [3:rea N/A] [5:Curette] [N/A:N/A] Instrument: [3:N/A] [5:Minimum] [N/A:N/A] Bleeding: [3:N/A] [5:Pressure] [N/A:N/A] Hemostasis A chieved: [3:N/A] [5:0] [N/A:N/A] Procedural Pain: [3:N/A] [5:0] [N/A:N/A] Post Procedural Pain: [3:N/A] [5:Procedure was tolerated well] [N/A:N/A] Debridement Treatment Response: [3:N/A] [5:0.2x0.2x0.1] [N/A:N/A] Post Debridement Measurements L x W x D (cm) [3:N/A] [5:0.003] [N/A:N/A] Post Debridement Volume: (cm) [3:Compression Therapy] [5:Debridement] [N/A:N/A] Treatment Notes Electronic Signature(s) Signed: 01/21/2020 5:27:06 PM By: [name] RN, BSN Signed: 01/21/2020 5:33:20 PM By: [name] MD Entered By: [name] on 01/21/2020 09:06:17 -------------------------------------------------------------------------------- Multi-Disciplinary Care Plan  Details Patient Name: Date of Service: [name], [name] HA M 01/21/2020 8:15 A M Medical Record Number: [id number] Patient Account Number: [id number] Date of Birth/Sex: Treating RN: 01-14-1946 (74 y.o. [name] Primary Care Trek Kimball: [name] Other Clinician: Referring Barbaraann Avans: Treating Derald Lorge/Extender: [name] in Treatment: 24 Active Inactive Wound/Skin Impairment Nursing Diagnoses: Impaired tissue integrity Goals: Patient/caregiver will verbalize understanding of skin care regimen Date Initiated: 08/06/2019 Target Resolution Date: 02/22/2020 Goal Status: Active Ulcer/skin breakdown will have a volume reduction of 30% by week 4 Date Initiated: 08/06/2019 Date Inactivated: 09/03/2019 Target Resolution Date: 09/07/2019 Goal Status: Unmet Unmet Reason: PAD, necrotic surface Interventions: Assess patient/caregiver ability to obtain necessary supplies Assess patient/caregiver ability to perform ulcer/skin care regimen upon admission and as needed Assess ulceration(s) every visit Provide education on ulcer and skin care Notes: Electronic Signature(s) Signed: 01/21/2020 5:27:06 PM By: [name] RN, BSN Entered By: [name] on 01/21/2020 08:23:50 -------------------------------------------------------------------------------- Pain Assessment Details Patient Name: Date of Service: [name], [name] HA M 01/21/2020 8:15 A M Medical Record Number: [id number] Patient Account Number: [id number] Date of Birth/Sex: Treating RN: 31-Dec-1945 (73 y.o. Oval Linsey Primary Care Bianco Cange: [name] Other Clinician: Referring Briawna Carver: Treating Gianlucca Szymborski/Extender: [name] in Treatment: 24 Active Problems Location of Pain Severity and Description of Pain Patient  Has Paino Yes Site Locations With Dressing Change: Yes Duration of the Pain. Constant / Intermittento  Intermittent Rate the pain. Current Pain Level: 2 Worst Pain Level: 5 Least Pain Level: 0 Tolerable Pain Level: 5 Character of Pain Describe the Pain: Throbbing Pain Management and Medication Current Pain Management: Medication: No Cold Application: No Rest: Yes Massage: No Activity: No T.E.N.S.: No Heat Application: No Leg drop or elevation: No Is the Current Pain Management Adequate: Inadequate How does your wound impact your activities of daily livingo Sleep: No Bathing: No Appetite: No Relationship With Others: No Bladder Continence: No Emotions: No Bowel Continence: No Work: No Toileting: No Drive: No Dressing: No Hobbies: No Electronic Signature(s) Signed: 01/21/2020 5:02:18 PM By: Carlene Coria RN Entered By: Carlene Coria on 01/21/2020 08:21:07 -------------------------------------------------------------------------------- Patient/Caregiver Education Details Patient Name: Date of Service: [name] 5/3/2021andnbsp8:15 A M Medical Record Number: [id number] Patient Account Number: [id number] Date of Birth/Gender: Treating RN: Sep 29, 1945 (74 y.o. [name] Primary Care Physician: [name] Other Clinician: Referring Physician: Treating Physician/Extender: [name] in Treatment: 24 Education Assessment Education Provided To: Patient Education Topics Provided Wound/Skin Impairment: Methods: Explain/Verbal Responses: State content correctly [employer]) Signed: 01/21/2020 5:27:06 PM By: [name] RN, BSN Entered By: [name] on 01/21/2020 08:24:02 -------------------------------------------------------------------------------- Wound Assessment Details Patient Name: Date of Service: [name], [name] HA M 01/21/2020 8:15 A M Medical Record Number: [id number] Patient Account Number: [id number] Date of Birth/Sex: Treating RN: [birth year] (73 y.o. [name]) Carlene Coria Primary Care  Ormond Lazo: [name] Other Clinician: Referring Jjesus Dingley: Treating Abbegayle Denault/Extender: [name] in Treatment: 24 Wound Status Wound Number: 3 Primary Venous Leg Ulcer Etiology: Wound Location: Right, Posterior Lower Leg Wound Open Wounding Event: Trauma Status: Date Acquired: 06/06/2019 Comorbid Cataracts, Anemia, Arrhythmia, Congestive Heart Failure, Weeks Of Treatment: 24 History: Hypertension, Type II Diabetes, Gout, Neuropathy Clustered Wound: No Wound Measurements Length: (cm) 6.1 Width: (cm) 2.7 Depth: (cm) 0.1 Area: (cm) 12.936 Volume: (cm) 1.294 % Reduction in Area: 42.6% % Reduction in Volume: 80.9% Epithelialization: Medium (34-66%) Tunneling: No Undermining: No Wound Description Classification: Full Thickness Without Exposed Support Structures Wound Margin: Flat and Intact Exudate Amount: Medium Exudate Type: Serosanguineous Exudate Color: red, brown Foul Odor After Cleansing: No Slough/Fibrino Yes Wound Bed Granulation Amount: Large (67-100%) Exposed Structure Granulation Quality: Red Fascia Exposed: No Necrotic Amount: Small (1-33%) Fat Layer (Subcutaneous Tissue) Exposed: Yes Necrotic Quality: Adherent Slough Tendon Exposed: No Muscle Exposed: No Joint Exposed: No Bone Exposed: No Treatment Notes Wound #3 (Right, Posterior Lower Leg) 1. Cleanse With Wound Cleanser Soap and water 2. Periwound Care Moisturizing lotion 3. Primary Dressing Applied Hydrofera Blue 4. Secondary Dressing ABD Pad 6. Support Layer [employer] Notes HB classic with normal saline. foam applied for protection at achilles area per patient. Electronic Signature(s) Signed: 01/21/2020 5:02:18 PM By: Carlene Coria RN Entered By: Carlene Coria on 01/21/2020 08:26:46 -------------------------------------------------------------------------------- Wound Assessment Details Patient Name: Date of Service: [name], [name] HA M  01/21/2020 8:15 A M Medical Record Number: [id number] Patient Account Number: [id number] Date of Birth/Sex: Treating RN: October 16, 1945 (73 y.o. [name]) Carlene Coria Primary Care Mirai Greenwood: [name] Other Clinician: Referring Tauri Ethington: Treating Linell Shawn/Extender: [name] in Treatment: 24 Wound Status Wound Number: 5 Primary Diabetic Wound/Ulcer of the Lower Extremity Etiology: Wound Location: Right T Fifth oe Wound Open Wounding Event: Blister Status: Date Acquired: 09/19/2019 Comorbid Cataracts, Anemia, Arrhythmia, Congestive Heart Failure,  Weeks Of Treatment: 17 History: Hypertension, Type II Diabetes, Gout, Neuropathy Clustered Wound: No Wound Measurements Length: (cm) 0.2 Width: (cm) 0.2 Depth: (cm) 0.1 Area: (cm) 0.031 Volume: (cm) 0.003 % Reduction in Area: 94.7% % Reduction in Volume: 94.9% Epithelialization: Medium (34-66%) Tunneling: No Undermining: No Wound Description Classification: Grade 2 Wound Margin: Flat and Intact Exudate Amount: Small Exudate Type: Serous Exudate Color: amber Foul Odor After Cleansing: No Slough/Fibrino Yes Wound Bed Granulation Amount: None Present (0%) Exposed Structure Necrotic Amount: Large (67-100%) Fascia Exposed: No Necrotic Quality: Adherent Slough Fat Layer (Subcutaneous Tissue) Exposed: No Tendon Exposed: No Muscle Exposed: No Joint Exposed: No Bone Exposed: No Treatment Notes Wound #5 (Right Toe Fifth) 1. Cleanse With Wound Cleanser Soap and water 3. Primary Dressing Applied [employer] 4. Secondary Dressing Dry Gauze 5. Secured With Medipore tape 7. Footwear/Offloading device applied Felt/Foam Surgical shoe Electronic Signature(s) Signed: 01/21/2020 5:02:18 PM By: Carlene Coria RN Entered By: Carlene Coria on 01/21/2020 08:26:47 -------------------------------------------------------------------------------- Vitals Details Patient Name: Date of Service: [name], [name] HA M 01/21/2020 8:15 A M Medical Record Number: [id number] Patient Account Number: [id number] Date of Birth/Sex: Treating RN: 01-15-1946 (73 y.o. [name]) Carlene Coria Primary Care Kamdon Reisig: [name] Other Clinician: Referring Lise Pincus: Treating Sheral Pfahler/Extender: [name] in Treatment: 24 Vital Signs Time Taken: 08:18 Temperature (F): 98.3 Height (in): 67 Pulse (bpm): 52 Weight (lbs): 190 Respiratory Rate (breaths/min): 18 Body Mass Index (BMI): 29.8 Blood Pressure (mmHg): 165/92 Reference Range: 80 - 120 mg / dl Electronic Signature(s) Signed: 01/21/2020 5:02:18 PM By: Carlene Coria RN Entered By: Carlene Coria on 01/21/2020 08:20:11

## 2020-01-21 NOTE — Progress Notes (Signed)
Michael, [name] ([id number]) Visit Report for 01/21/2020 Debridement Details Patient Name: Date of Service: Michael, [name] 01/21/2020 8:15 A M Medical Record Number: [id number] Patient Account Number: [id number] Date of Birth/Sex: Treating RN: [birth year] (74 y.o. [name] Primary Care Provider: [name] Other Clinician: Referring Provider: Treating Provider/Extender: [name] in Treatment: 24 Debridement Performed for Assessment: Wound #5 Right T Fifth oe Performed By: Physician Ricard Dillon., MD Debridement Type: Debridement Severity of Tissue Pre Debridement: Fat layer exposed Level of Consciousness (Pre-procedure): Awake and Alert Pre-procedure Verification/Time Out Yes - 08:48 Taken: Start Time: 08:48 Pain Control: Lidocaine 5% topical ointment T Area Debrided (L x W): otal 0.2 (cm) x 0.2 (cm) = 0.04 (cm) Tissue and other material debrided: Non-Viable, Subcutaneous, Biofilm Level: Skin/Subcutaneous Tissue Debridement Description: Excisional Instrument: Curette Bleeding: Minimum Hemostasis Achieved: Pressure End Time: 08:49 Procedural Pain: 0 Post Procedural Pain: 0 Response to Treatment: Procedure was tolerated well Level of Consciousness (Post- Awake and Alert procedure): Post Debridement Measurements of Total Wound Length: (cm) 0.2 Width: (cm) 0.2 Depth: (cm) 0.1 Volume: (cm) 0.003 Character of Wound/Ulcer Post Debridement: Improved Severity of Tissue Post Debridement: Fat layer exposed Post Procedure Diagnosis Same as Pre-procedure Electronic Signature(s) Signed: 01/21/2020 5:27:06 PM By: [name] RN, BSN Signed: 01/21/2020 5:33:20 PM By: [name] MD Entered By: [name] on 01/21/2020 09:06:29 -------------------------------------------------------------------------------- HPI Details Patient Name: Date of Service: [name] M 01/21/2020 8:15 A M Medical Record Number:  [id number] Patient Account Number: [id number] Date of Birth/Sex: Treating RN: 02/10/1946 (74 y.o. [name] Primary Care Provider: [name] Other Clinician: Referring Provider: Treating Provider/Extender: [name] in Treatment: 24 History of Present Illness HPI Description: [name] HPI Description: 74 year old gentleman who was recently seen by his nephrologist [name], and noted to have a wound on his left lower extremity which was lacerated 2 months ago and now has reopened. The patient's left shin has a ulceration with some exudate but no evidence of infection and he was referred to [place] for further care as it was known that the patient has had some peripheral vascular disease in the past. Past medical history significant for chronic kidney disease, atrial fibrillation, diabetes mellitus,status post kidney transplant in 1983 and 2005, a week fistula graft placement, status post previous bowel surgery. 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 10/06/2015 -- x-ray of the left tibia and fibula shows no evidence of osteomyelitis. The patient has also had Doppler studies of his extremity and is awaiting the appointment with the vascular surgeon. We have not yet received these reports. 10/13/2015 -- lower extremity venous duplex reflux evaluation shows reflux in the left common femoral vein, left saphenofemoral junction and the proximal greater saphenous vein extending to the proximal calf. There is also reflux in the left proximal to mid small saphenous vein. Arterial duplex studies done showed the resting ABI was not applicable due to tibial artery medial calcification. 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 The right TBI was 1 which is normal and the left ABI was 1 which  is normal. Patient has otherwise been doing fine and has been compliant with his dressings. 10/20/2015 -- He was seen by [name] recently for a vascular opinion on 10/15/2015. His left  lower extremity venous insufficiency duplex study revealed GSV reflux,SS vein reflux and deep venous reflux in the common femoral vein. His ABIs were non compressible and his TBI on the right was 1.01 and on the left was 0.80. He was asked to continue with the wound care with compressive therapy followed by EVLA of the left GS vein 3 months. He recommended 20-30 mm thigh- high compression stockings and the need for a three-month trial of this. The patient had an Unna boot applied at the vascular office but he could not tolerate this with a lot of pain and issues with his toes and hence came here on Friday for removal of this and we reapplied a 2 layer compression. 11/10/2015 -- patient still has not purchased his 20-30 mm thigh-high compression stockings as prescribed by [name]. Readmission: 08/08/18 on evaluation today patient presents for readmission concerning a new injury to the left anterior lower extremity. He was previously seen in 2017 here in our clinic. He states that he has done fairly well since that point. Nonetheless he is having at this time some pain but states that he hit this on a table that fell over and actually struck his leg. This appears to have pulled back some of his skin which folded in on itself and is causing some difficulty as far as that is concerned. There does not appear to be any evidence of infection at this time. No fevers, chills, nausea, or vomiting noted at this time. He's been using dressings on his own currently without complication. 08/15/18 on evaluation today patient actually appears to be doing somewhat better in regard to his wound of the lower [name] when compared to the first visit last week. I had to do a much more extensive debridement at that time it does  appear that I'm gonna have to perform some debridement today but it does not look to be as extensive by any means. Nonetheless fortunately he does not show any signs of infection he does have discomfort at this site. I believe based on what I'm seeing currently he may benefit from Iodoflex to help keep the wound bed clean. Patient tolerated therapy without complication. Upon evaluation today the patient actually appears to be doing excellent in regard to his left lower extremity ulcer. This is much better than the previous two visits where he had a lot of necrotic tissue around the edge of the wound simply due to the fact that again there was a significant skin tear where the edge had been cleared away prior to reattaching and being able to heal appropriately. He seems to be doing much better at this point. 08/28/18 on evaluation today the patient's wound actually does appear to be showing signs of improvement. With that being said though he is improving he would likely note even greater improvement if we were able to sharply debride the wound. Nonetheless this caused him to much discomfort he tells me. 09/04/18 on evaluation today patient actually appears to be showing signs of improvement in regard to his left lower extremity ulcer. He has been tolerating the dressing changes including the wrap although he tells me at this point that the burning does last for a couple of days even with just the Iodoflex. I was afraid that this may been part of the issue that he was having with discomfort. It does seem to be the case. Nonetheless he shows no signs of evidence of infection at this time which is good news. No fevers chills  noted ADMISSION to [name] 10/05/2018 This is a patient who was cared for in 2017 and again in the fall of this year at our sister clinic and [place]. He actually lives in [place] in [place]. We have been dealing with an apparent traumatic area on the left  anterior tibial area. This is been present for the last several months. He was supposed to be using Iodoflex Kerlix and Coban however he was hospitalized from 09/05/2018 through 09/11/2018 with delirium secondary to pneumonia. Since then he is only been putting Vaseline gauze on this without compression. He also has a more recent skin tear on the dorsal right hand that may have only happened in the last week. The patient had arterial studies done in 2017 in January which was 3 years ago. At that point he had noncompressible ABIs but really quite good TBI's both normal. Triphasic waveforms on the right monophasic at the left posterior tibial but triphasic at the left dorsalis pedis. His ABIs in our clinic today were both noncompressible 1/23; the patient has wounds on the right dorsal hand just distal to the wrist and on the left anterior lower extremity. Both of these look very healthy he is using Hydrofera Blue 1/30; left anterior lower extremity wound much smaller. Healthy looking surface. The laceration area just distal to the wrist on the dorsal hand on the right is also just about closed I used Hydrofera Blue here 2/6; left anterior lower extremity wound is much smaller but still open. The laceration area just distal to the wrist on the dorsal hand is fully epithelialized. 2/13; the patient's anterior lower extremity wound is closed. The laceration just distal to the wrist on the dorsal hand is also fully epithelialized and closed. The patient has external compression stockings which I think are 20/30 READMISSION 08/06/2019 [name] is a 74 year old man with had several times previously in our clinic. He is a diabetic with a history of chronic renal insufficiency status post kidney transplant in 1983 and again in 2005. He was then in 2017 with a laceration on the left lower extremity. He was worked up at the time with arterial studies and reflux studies. The arterial studies showed ABIs to  be noncompressible but TBI's were within normal limits. I do not have the reflux studies at the moment. He was also sent here in 2019 with a left lower extremity wound and then again in 2020 with left lower extremity trauma a skin tear on the wrist. He was discharged with 20/30 stockings identified from myself that that might not be enough compression. Nevertheless he states he was wearing these fairly reliably. In September he had a fall with a substantial bruise in the area of the wound. He says he saw orthopedics and they told him there was some muscle strain sometime it later this opened into a wound. He has a fairly substantial wound on the right posterior calf. Satellite areas around this including medially and posteriorly. He has not worn his stockings since the injury Past medical history; includes chronic renal failure secondary to diabetes with kidney transplant x2, atrial fibrillation, heart failure with preserved ejection fraction, coronary artery disease. ABIs on the right in our clinic were once again noncompressible 08/13/2019 on evaluation today patient appears to be doing decently well with regard to his wound compared to last week's evaluation. Unfortunately he is still having a lot of discomfort at this point which is I think in some part due to the 3  layer compression wrap which is a little bit stronger I think for him. When he was here before we actually utilized a Kerlix and Coban wrap which he states seemed to got a little bit better. Nonetheless I think we can probably drop back to this in light of the discomfort that he had. Nonetheless the pain was not really right around the wound itself as much as it was around the ankle in particular. The Iodoflex does seem to have done well for him As the wound is appearing somewhat better today which is excellent news. 11/30; still complaining of a lot of pain. Apparently arterial studies I ordered 2 weeks ago are below. I do not believe  we have an appointment with vein and vascular as of yet; ABI Findings: +---------+------------------+-----+----------+--------+ Right Rt Pressure (mmHg)IndexWaveform Comment  +---------+------------------+-----+----------+--------+ PTA >254 1.50 monophasic  +---------+------------------+-----+----------+--------+ DP >254 1.50 monophasic  +---------+------------------+-----+----------+--------+ Great T oe68 0.40 Abnormal   +---------+------------------+-----+----------+--------+ +---------+------------------+-----+----------+-------+ Left Lt Pressure (mmHg)IndexWaveform Comment +---------+------------------+-----+----------+-------+ Brachial 169     +---------+------------------+-----+----------+-------+ PTA >254 1.50 monophasic  +---------+------------------+-----+----------+-------+ DP >254 1.50 monophasic  +---------+------------------+-----+----------+-------+ Great T oe40 0.24 Abnormal   +---------+------------------+-----+----------+-------+ Pedal arteries appear hyperemic. Patient refused Brachial pressure in the right arm. Summary: Right: Resting right ankle-brachial index indicates noncompressible right lower extremity arteries. The right toe-brachial index is abnormal. Left: Resting left ankle-brachial index indicates noncompressible left lower extremity arteries. The left toe-brachial index is abnormal. He is also having considerably more swelling in his left calf. This was not there when I last saw him 2 weeks ago. He tells me that some of the home health compression wraps have been slipping down and that may be the issue here however a month I am uncertain 79/0; sees vascular on December 22. Still complaining of a lot of pain. DVT study I did last time was negative for DVT 12/14; still complaining of pain which if this is arterial is certainly claudication and rest keeps him uncomfortable at night. He has an appointment  with vein and vascular on December 22. Wound surface is better using Iodoflex. Once the surface of this is satisfactory and we have exhausted the vascular route. Perhaps an advanced treatment option. He has a configuration of the venous ulceration although his arterial studies are not very good. The other issue is the patient has a transplanted kidney. This will make angiography difficult and challenging issue 12/21; still complaining of pain and drainage. We are using Iodoflex on the wound under compression. He sees Dr. Donnetta Hutching tomorrow to evaluate his noninvasive studies noted above. He has a transplanted kidney further complicating the options for angiography. 12/28; still using Iodoflex under compression. I have Dr. Luther Parody note from 12/22. he noted arterial studies revealing monophasic waveforms at the pedal vessels bilaterally and calcified vessels making the ABI unreliable. He did not comment on the reduced TBI's. He felt these were venous wounds based on the palpable dorsalis pedis pulse. 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 And they arranged for formal venous duplex with reflux studies in the next several weeks. Follow-up with either Scot Dock or Dr. Oneida Alar 09/24/2019 still using Iodoflex under compression. He has an appointment with Dr. Doren Custard on 1/7 with regards to his venous disease. The patient was not felt to have a primary arterial problem for the nonhealing of his wound. We did attempt to wrap him with 3 layer when he first came into the clinic he complained of a lot of pain in the ankle although this may have been because the  dressing fell down somewhat. He has far too edema fluid in the right leg for a good prognosis about healing this wound He comes in today with an excoriation on the bottom part of his right fifth toe. He thinks he may have done this taking off his clothes and that something got caught on the toe. There is no open wound however the toe itself is very  painful 1/11; we are using Iodoflex under compression. The wound bed is clean. He went on to see Dr. Doren Custard on 09/27/2019 he again feels that the patient's arterial supply is adequate. He feels that he might benefit from right greater saphenous vein ablation for a venous ulcer. In the meantime the area that he was complaining about last week on the right fifth toe. An x-ray that I ordered showed marked soft tissue swelling along the distal aspect of the right foot but there was no evidence of osteomyelitis. He comes in today with the fifth toenail just about coming off. He has black eschar underneath the toe on the plantar aspect. The toe was swollen red and very painful. In the right setting this could be a significant soft tissue infection versus ischemia of the toe itself. He did not show this to Dr. Doren Custard 1/18; I am using Iodoflex under compression a large wound on the right posterior calf. He had his right greater saphenous vein ablation by Dr. Doren Custard although I am not sure that is the only problem here. The right fifth toe which was possibly trauma 2 weeks ago continues to be exceptionally painful with a necrotic tip. Maybe not quite as swollen. I started him empirically on Augmentin 5/125 1 p.o. twice daily last week after discussing this with Dr. Loletha Grayer of nephrology. Perhaps somewhat better this week but not as good as I was hoping. A plain x-ray was negative. He comes in today with an area on the medial right calf that was blistered and now open. In looking at things he appears to be systemically fluid volume overloaded. He has a transplanted kidney. He states he takes his Lasix variably when he has appointments he tries not to take it the later I am really not certain if he takes this reliably. However he has far too much edema fluid in the right leg to easily heal this wound and he appears to be developing blisters medially to form additional wounds 1/25; his CO2 angiogram was done by Dr. Doren Custard  last week. This showed the proximal arteries all to be patent. On the right side the common femoral deep femoral superficial femoral popliteal anterior tibial and peroneal arteries were patent. The posterior tibial was occluded but reconstituted distally via collaterals from the peroneal artery he was felt he should have enough blood flow to heal his wounds including the right fifth toe. The right fifth toe looks better however he is still complaining of a lot of pain. The large area which is a venous ulceration posteriorly has a better looking surface I think we can switch to Hydrofera Blue today 2/1; the patient's original wounds on the right posterior medial calf has come down in width however superior to this he has new denuded areas and I am concerned we simply do not have enough edema control. He has already undergone a right greater saphenous ablation. He had a CO2 angiogram done by Dr. Doren Custard and the comment was that we should have enough blood flow to heal the venous wound however we simply do not seem to have  enough edema control with 3 layer compression. The patient is status post kidney transplant although his exact renal function is not really clear. Nor am i sure what dose of diuretic he is supposed to be on. He also has the area on the right fifth toe which was unclear etiology but I think became secondarily infected I gave him 2 weeks of antibiotics for this and this seems to have settled down he still has a black eschar over the tip of the toe. X-ray was negative for fracture. He says he has a history of gout. 2/8; the patient's wound on the right posterior calf is about the same. The superficial area medially also about the same. He got a prescription for prednisoneo Gout after he developed erythema on the dorsal aspect of his left great toe going along with the right fifth toe which has been problematic all along. He has not taken it because he is concerned about increasing CBGs. He  has a transplanted kidney is already on prednisone 5 mg. He would not be a good candidate for NSAIDs. Perhaps colchicine. He is not aware of what his uric acid level is 2/15; his right posterior calf wound seems to be coming in in terms of width. Everything here looks fairly good. No mechanical debridement we have been using Hydrofera Blue. He has had an ablation by vein and vascular. Felt to have adequate arterial supply to this area. The patient got prednisone last week from Dr. Angelique Holm of nephrology. At my urging he actually took it. The area on his left toe was a lot better. The right toe was not as painful but still erythematous with a wound at the tip. We have been using silver alginate here 2/22; right posterior calf seems to be gradually epithelializing. Still a fairly substantial wound. He still has an area on the tip of his right fifth toe which I think was gout related. This is gradually improving. We have been using Unna boots to wrap X-ray I did of the foot last time was again negative there was soft tissue irregularity about the distal fifth digit but no radiographic evidence of bone damage 3/1; right posterior calf seems to be gradually improving however there were areas of hyper granulation. We have been using Hydrofera Blue. The hyper granulation is mostly evident in the most superior finger shape projection of the wound itself. The area on the right fifth toe also was slough covered and required debridement. 3/8 continued improvement in the right posterior calf and the tip of the right fifth toe. We have been using Hydrofera Blue under compression he is changing the area to the toe 3/22; continued improvement in the right posterior calf. Unfortunately comes in today with a new skin tear in the mid anterior tibia area this probably had something to do with changing his dressing home health I called and left this a message last week. 3/29; continued improvement in the a  substantial wound on the right posterior calf. The skin tear anteriorly from last week is already healed on the tip of the right fifth toe there is still a nonviable area. 4/5; we have continued contraction of the substantial wound on the right posterior calf. We continue to have problems with the tip of the right fifth toe. We have been using Hydrofera Blue to both wound areas He is complaining about some discomfort in the Achilles area. T me he had surgery for what sounds like a torn Achilles about 10 years ago. ells  4/12; we have continued contraction of the substantial wound on the right posterior calf. Still an area on the tip of the right fifth toe. I changed him to silver collagen on the toe last week but I think home health continue to use Hydrofera Blue to both wound areas. 4/19; we continue to have contraction of the substantial wound on the right posterior calf however there continues to be hyper granulation. The area on the tip of the fifth toe is very small still some debris on the surface we have been using silver collagen 4/26; both wounds have contracted. I was hoping the fifth toe wound would close over but with probing there is still an open small hole here. 5/3; his wound on the right posterior calf which was his major area continues to contract looks healthy we have been using Hydrofera Blue. Using silver collagen to the fifth toe, not making a lot of progress here Electronic Signature(s) Signed: 01/21/2020 5:33:20 PM By: [name] MD Entered By: [name] on 01/21/2020 09:07:53 -------------------------------------------------------------------------------- Physical Exam Details Patient Name: Date of Service: Michael, [name] [name] M 01/21/2020 8:15 A M Medical Record Number: [id number] Patient Account Number: [id number] Date of Birth/Sex: Treating RN: December 24, 1945 (74 y.o. [name] Primary Care Provider: [name] Other Clinician: Referring  Provider: Treating Provider/Extender: [name] in Treatment: 24 Constitutional Patient is hypertensive.. Pulse regular and within target range for patient.Marland Kitchen Respirations regular, non-labored and within target range.. Temperature is normal and within the target range for the patient.Marland Kitchen Appears in no distress. Notes Wound exam Right fifth toe. Debris over the surface removed with a #3 curette skin and subcutaneous tissue. I am able to get down to a healthy looking wound relatively with some depth. The toe itself is in a hammer deformity going medially. I wonder if this is all a pressure issue in the shoe. I have asked our staff to try and straighten the toe initially with some gauze to see if offloading this more aggressively will turn the corner The area of the major wound on the right posterior calf continues to measure smaller. Healthy looking wound surface Electronic Signature(s) Signed: 01/21/2020 5:33:20 PM By: [name] MD Entered By: [name] on 01/21/2020 09:09:10 -------------------------------------------------------------------------------- Physician Orders Details Patient Name: Date of Service: Michael, [name] [name] M 01/21/2020 8:15 A M Medical Record Number: [id number] Patient Account Number: [id number] Date of Birth/Sex: Treating RN: 03-Jan-1946 (74 y.o. [name] Primary Care Provider: [name] Other Clinician: Referring Provider: Treating Provider/Extender: [name] in Treatment: 24 Verbal / Phone Orders: No Diagnosis Coding ICD-10 Coding Code Description S80.11XD Contusion of right lower leg, subsequent encounter L97.212 Non-pressure chronic ulcer of right calf with fat layer exposed I87.321 Chronic venous hypertension (idiopathic) with inflammation of right lower extremity E11.622 Type 2 diabetes mellitus with other skin ulcer L97.518 Non-pressure chronic ulcer of other  part of right foot with other specified severity Follow-up Appointments Return Appointment in 1 week. Dressing Change Frequency Wound #3 Right,Posterior Lower Leg Other: - twice a week - wound clinic to change 1x a week on Monday, home health to change 1x a week on Thursday or Friday Wound #5 Right T Fifth oe Other: - twice a week - wound clinic to change 1x a week on Monday, home health to change 1x a week on Thursday or Friday Skin Barriers/Peri-Wound Care Wound #3 Right,Posterior Lower Leg Barrier cream Moisturizing lotion - to leg Wound  Cleansing Clean wound with Normal Saline. - or normal saline on days that dressing is changed May shower with protection. Primary Wound Dressing Wound #3 Right,Posterior Lower Leg Hydrofera Blue - Classic Wound #5 Right T Fifth oe Silver Collagen - moisten with hydrogel or KY jelly Secondary Dressing Wound #3 Right,Posterior Lower Leg Foam - foam top bend of foot for protection ABD pad Zetuvit or Kerramax - or Xtrasorb Wound #5 Right T Fifth oe Kerlix/Rolled Gauze Dry Gauze Other: - felt or rolled up gauze under toe to elevate tip of toe off of shoe Edema Control Unna Boot to Right Lower Extremity - *****ONLY ONE SINGLE LAYER OF UNNA BOOT***** - foam to achilles to pad Avoid standing for long periods of time Elevate legs to the level of the heart or above for 30 minutes daily and/or when sitting, a frequency of: - throughout the day Exercise regularly Additional Orders / Instructions Other: - Go to ER if toe/leg gets more red/black, increased pain, fever [place] [facility] for wound care. - [facility] Electronic Signature(s) Signed: 01/21/2020 5:27:06 PM By: [name] RN, BSN Signed: 01/21/2020 5:33:20 PM By: [name] MD Entered By: [name] on 01/21/2020 08:51:13 -------------------------------------------------------------------------------- Problem List Details Patient Name: Date of  Service: JERMARIO, KALMAR [name] M 01/21/2020 8:15 A M Medical Record Number: [id number] Patient Account Number: [id number] Date of Birth/Sex: Treating RN: 07/01/1946 (74 y.o. [name], [name] Primary Care Provider: [name] Other Clinician: Referring Provider: Treating Provider/Extender: [name] in Treatment: 24 Active Problems ICD-10 Encounter Code Description Active Date MDM Diagnosis S80.11XD Contusion of right lower leg, subsequent encounter 08/06/2019 No Yes L97.212 Non-pressure chronic ulcer of right calf with fat layer exposed 08/06/2019 No Yes I87.321 Chronic venous hypertension (idiopathic) with inflammation of right lower 08/06/2019 No Yes extremity E11.622 Type 2 diabetes mellitus with other skin ulcer 08/06/2019 No Yes L97.518 Non-pressure chronic ulcer of other part of right foot with other specified 10/01/2019 No Yes severity Inactive Problems ICD-10 Code Description Active Date Inactive Date L97.811 Non-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 12/10/2019 12/10/2019 Resolved Problems Electronic Signature(s) Signed: 01/21/2020 5:33:20 PM By: [name] MD Entered By: [name] on 01/21/2020 12:75:17 -------------------------------------------------------------------------------- Progress Note Details Patient Name: Date of Service: Michael, [name] [name] M 01/21/2020 8:15 A M Medical Record Number: [id number] Patient Account Number: [id number] Date of Birth/Sex: Treating RN: 07-04-1946 (74 y.o. [name] Primary Care Provider: [name] Other Clinician: Referring Provider: Treating Provider/Extender: [name] in Treatment: 24 Subjective History of Present Illness (HPI) [place] HPI Description: 74 year old gentleman who was recently seen by his nephrologist [name], and noted to have a wound on his left  lower extremity which was lacerated 2 months ago and now has reopened. The patient's left shin has a ulceration with some exudate but no evidence of infection and he was referred to [place] for further care as it was known that the patient has had some peripheral vascular disease in the past. Past medical history significant for chronic kidney disease, atrial fibrillation, diabetes mellitus,status post kidney transplant in 1983 and 2005, a week fistula graft placement, status post previous bowel surgery. 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 10/06/2015 -- x-ray of the left tibia and fibula shows no evidence of osteomyelitis. The patient has also had Doppler studies of  his extremity and is awaiting the appointment with the vascular surgeon. We have not yet received these reports. 10/13/2015 -- lower extremity venous duplex reflux evaluation shows reflux in the left common femoral vein, left saphenofemoral junction and the proximal greater saphenous vein extending to the proximal calf. There is also reflux in the left proximal to mid small saphenous vein. Arterial duplex studies done showed the resting ABI was not applicable due to tibial artery medial calcification. 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 The right TBI was 1 which is normal and the left ABI was 1 which is normal. Patient has otherwise been doing fine and has been compliant with his dressings. 10/20/2015 -- He was seen by [name] recently for a vascular opinion on 10/15/2015. His left lower extremity venous insufficiency duplex study revealed GSV reflux,SS vein reflux and deep venous reflux in the common femoral vein. His ABIs were non compressible and his TBI on the right was 1.01 and on the left was 0.80. He was asked to continue with the wound care with compressive  therapy followed by EVLA of the left GS vein 3 months. He recommended 20-30 mm thigh- high compression stockings and the need for a three-month trial of this. The patient had an Unna boot applied at the vascular office but he could not tolerate this with a lot of pain and issues with his toes and hence came here on Friday for removal of this and we reapplied a 2 layer compression. 11/10/2015 -- patient still has not purchased his 20-30 mm thigh-high compression stockings as prescribed by [name]. Readmission: 08/08/18 on evaluation today patient presents for readmission concerning a new injury to the left anterior lower extremity. He was previously seen in 2017 here in our clinic. He states that he has done fairly well since that point. Nonetheless he is having at this time some pain but states that he hit this on a table that fell over and actually struck his leg. This appears to have pulled back some of his skin which folded in on itself and is causing some difficulty as far as that is concerned. There does not appear to be any evidence of infection at this time. No fevers, chills, nausea, or vomiting noted at this time. He's been using dressings on his own currently without complication. 08/15/18 on evaluation today patient actually appears to be doing somewhat better in regard to his wound of the lower [name] when compared to the first visit last week. I had to do a much more extensive debridement at that time it does appear that I'm gonna have to perform some debridement today but it does not look to be as extensive by any means. Nonetheless fortunately he does not show any signs of infection he does have discomfort at this site. I believe based on what I'm seeing currently he may benefit from Iodoflex to help keep the wound bed clean. Patient tolerated therapy without complication. Upon evaluation today the patient actually appears to be doing excellent in regard to his left lower  extremity ulcer. This is much better than the previous two visits where he had a lot of necrotic tissue around the edge of the wound simply due to the fact that again there was a significant skin tear where the edge had been cleared away prior to reattaching and being able to heal appropriately. He seems to  be doing much better at this point. 08/28/18 on evaluation today the patient's wound actually does appear to be showing signs of improvement. With that being said though he is improving he would likely note even greater improvement if we were able to sharply debride the wound. Nonetheless this caused him to much discomfort he tells me. 09/04/18 on evaluation today patient actually appears to be showing signs of improvement in regard to his left lower extremity ulcer. He has been tolerating the dressing changes including the wrap although he tells me at this point that the burning does last for a couple of days even with just the Iodoflex. I was afraid that this may been part of the issue that he was having with discomfort. It does seem to be the case. Nonetheless he shows no signs of evidence of infection at this time which is good news. No fevers chills noted ADMISSION to [name] 10/05/2018 This is a patient who was cared for in 2017 and again in the fall of this year at our sister clinic and [facility]. He actually lives in [place] in [place]. We have been dealing with an apparent traumatic area on the left anterior tibial area. This is been present for the last several months. He was supposed to be using Iodoflex Kerlix and Coban however he was hospitalized from 09/05/2018 through 09/11/2018 with delirium secondary to pneumonia. Since then he is only been putting Vaseline gauze on this without compression. He also has a more recent skin tear on the dorsal right hand that may have only happened in the last week. The patient had arterial studies done in 2017 in January which  was 3 years ago. At that point he had noncompressible ABIs but really quite good TBI's both normal. Triphasic waveforms on the right monophasic at the left posterior tibial but triphasic at the left dorsalis pedis. His ABIs in our clinic today were both noncompressible 1/23; the patient has wounds on the right dorsal hand just distal to the wrist and on the left anterior lower extremity. Both of these look very healthy he is using Hydrofera Blue 1/30; left anterior lower extremity wound much smaller. Healthy looking surface. The laceration area just distal to the wrist on the dorsal hand on the right is also just about closed I used Hydrofera Blue here 2/6; left anterior lower extremity wound is much smaller but still open. The laceration area just distal to the wrist on the dorsal hand is fully epithelialized. 2/13; the patient's anterior lower extremity wound is closed. The laceration just distal to the wrist on the dorsal hand is also fully epithelialized and closed. The patient has external compression stockings which I think are 20/30 READMISSION 08/06/2019 [name] is a 74 year old man with had several times previously in our clinic. He is a diabetic with a history of chronic renal insufficiency status post kidney transplant in 1983 and again in 2005. He was then in 2017 with a laceration on the left lower extremity. He was worked up at the time with arterial studies and reflux studies. The arterial studies showed ABIs to be noncompressible but TBI's were within normal limits. I do not have the reflux studies at the moment. He was also sent here in 2019 with a left lower extremity wound and then again in 2020 with left lower extremity trauma a skin tear on the wrist. He was discharged with 20/30 stockings identified from myself that that might not be enough compression. Nevertheless he states  he was wearing these fairly reliably. In September he had a fall with a substantial bruise in the  area of the wound. He says he saw orthopedics and they told him there was some muscle strain sometime it later this opened into a wound. He has a fairly substantial wound on the right posterior calf. Satellite areas around this including medially and posteriorly. He has not worn his stockings since the injury Past medical history; includes chronic renal failure secondary to diabetes with kidney transplant x2, atrial fibrillation, heart failure with preserved ejection fraction, coronary artery disease. ABIs on the right in our clinic were once again noncompressible 08/13/2019 on evaluation today patient appears to be doing decently well with regard to his wound compared to last week's evaluation. Unfortunately he is still having a lot of discomfort at this point which is I think in some part due to the 3 layer compression wrap which is a little bit stronger I think for him. When he was here before we actually utilized a Kerlix and Coban wrap which he states seemed to got a little bit better. Nonetheless I think we can probably drop back to this in light of the discomfort that he had. Nonetheless the pain was not really right around the wound itself as much as it was around the ankle in particular. The Iodoflex does seem to have done well for him As the wound is appearing somewhat better today which is excellent news. 11/30; still complaining of a lot of pain. Apparently arterial studies I ordered 2 weeks ago are below. I do not believe we have an appointment with vein and vascular as of yet; ABI Findings: +---------+------------------+-----+----------+--------+ Right Rt Pressure (mmHg)IndexWaveform Comment  +---------+------------------+-----+----------+--------+ PTA >254 1.50 monophasic  +---------+------------------+-----+----------+--------+ DP >254 1.50 monophasic  +---------+------------------+-----+----------+--------+ Great T oe68 0.40 Abnormal    +---------+------------------+-----+----------+--------+ +---------+------------------+-----+----------+-------+ Left Lt Pressure (mmHg)IndexWaveform Comment +---------+------------------+-----+----------+-------+ Brachial 169     +---------+------------------+-----+----------+-------+ PTA >254 1.50 monophasic  +---------+------------------+-----+----------+-------+ DP >254 1.50 monophasic  +---------+------------------+-----+----------+-------+ Great T oe40 0.24 Abnormal   +---------+------------------+-----+----------+-------+ Pedal arteries appear hyperemic. Patient refused Brachial pressure in the right arm. Summary: Right: Resting right ankle-brachial index indicates noncompressible right lower extremity arteries. The right toe-brachial index is abnormal. Left: Resting left ankle-brachial index indicates noncompressible left lower extremity arteries. The left toe-brachial index is abnormal. He is also having considerably more swelling in his left calf. This was not there when I last saw him 2 weeks ago. He tells me that some of the home health compression wraps have been slipping down and that may be the issue here however a month I am uncertain 85/6; sees vascular on December 22. Still complaining of a lot of pain. DVT study I did last time was negative for DVT 12/14; still complaining of pain which if this is arterial is certainly claudication and rest keeps him uncomfortable at night. He has an appointment with vein and vascular on December 22. Wound surface is better using Iodoflex. Once the surface of this is satisfactory and we have exhausted the vascular route. Perhaps an advanced treatment option. He has a configuration of the venous ulceration although his arterial studies are not very good. The other issue is the patient has a transplanted kidney. This will make angiography difficult and challenging issue 12/21; still complaining of pain and  drainage. We are using Iodoflex on the wound under compression. He sees Dr. Donnetta Hutching tomorrow to evaluate his noninvasive studies noted above. He has a transplanted kidney further complicating the options for angiography. 12/28;  still using Iodoflex under compression. I have Dr. Luther Parody note from 12/22. he noted arterial studies revealing monophasic waveforms at the pedal vessels bilaterally and calcified vessels making the ABI unreliable. He did not comment on the reduced TBI's. He felt these were venous wounds based on the palpable dorsalis pedis pulse. 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 And they arranged for formal venous duplex with reflux studies in the next several weeks. Follow-up with either Scot Dock or Dr. Oneida Alar 09/24/2019 still using Iodoflex under compression. He has an appointment with Dr. Doren Custard on 1/7 with regards to his venous disease. The patient was not felt to have a primary arterial problem for the nonhealing of his wound. We did attempt to wrap him with 3 layer when he first came into the clinic he complained of a lot of pain in the ankle although this may have been because the dressing fell down somewhat. He has far too edema fluid in the right leg for a good prognosis about healing this wound He comes in today with an excoriation on the bottom part of his right fifth toe. He thinks he may have done this taking off his clothes and that something got caught on the toe. There is no open wound however the toe itself is very painful 1/11; we are using Iodoflex under compression. The wound bed is clean. He went on to see Dr. Doren Custard on 09/27/2019 he again feels that the patient's arterial supply is adequate. He feels that he might benefit from right greater saphenous vein ablation for a venous ulcer. In the meantime the area that he was complaining about last week on the right fifth toe. An x-ray that I ordered showed marked soft tissue swelling along the distal aspect of the right  foot but there was no evidence of osteomyelitis. He comes in today with the fifth toenail just about coming off. He has black eschar underneath the toe on the plantar aspect. The toe was swollen red and very painful. In the right setting this could be a significant soft tissue infection versus ischemia of the toe itself. He did not show this to Dr. Doren Custard 1/18; I am using Iodoflex under compression a large wound on the right posterior calf. He had his right greater saphenous vein ablation by Dr. Doren Custard although I am not sure that is the only problem here. ooThe right fifth toe which was possibly trauma 2 weeks ago continues to be exceptionally painful with a necrotic tip. Maybe not quite as swollen. I started him empirically on Augmentin 5/125 1 p.o. twice daily last week after discussing this with Dr. Loletha Grayer of nephrology. Perhaps somewhat better this week but not as good as I was hoping. A plain x-ray was negative. He comes in today with an area on the medial right calf that was blistered and now open. In looking at things he appears to be systemically fluid volume overloaded. He has a transplanted kidney. He states he takes his Lasix variably when he has appointments he tries not to take it the later I am really not certain if he takes this reliably. However he has far too much edema fluid in the right leg to easily heal this wound and he appears to be developing blisters medially to form additional wounds 1/25; his CO2 angiogram was done by Dr. Doren Custard last week. This showed the proximal arteries all to be patent. On the right side the common femoral deep femoral superficial femoral popliteal anterior tibial and peroneal arteries  were patent. The posterior tibial was occluded but reconstituted distally via collaterals from the peroneal artery he was felt he should have enough blood flow to heal his wounds including the right fifth toe. The right fifth toe looks better however he is still complaining of a  lot of pain. The large area which is a venous ulceration posteriorly has a better looking surface I think we can switch to Hydrofera Blue today 2/1; the patient's original wounds on the right posterior medial calf has come down in width however superior to this he has new denuded areas and I am concerned we simply do not have enough edema control. He has already undergone a right greater saphenous ablation. He had a CO2 angiogram done by Dr. Doren Custard and the comment was that we should have enough blood flow to heal the venous wound however we simply do not seem to have enough edema control with 3 layer compression. The patient is status post kidney transplant although his exact renal function is not really clear. Nor am i sure what dose of diuretic he is supposed to be on. He also has the area on the right fifth toe which was unclear etiology but I think became secondarily infected I gave him 2 weeks of antibiotics for this and this seems to have settled down he still has a black eschar over the tip of the toe. X-ray was negative for fracture. He says he has a history of gout. 2/8; the patient's wound on the right posterior calf is about the same. The superficial area medially also about the same. He got a prescription for prednisoneo Gout after he developed erythema on the dorsal aspect of his left great toe going along with the right fifth toe which has been problematic all along. He has not taken it because he is concerned about increasing CBGs. He has a transplanted kidney is already on prednisone 5 mg. He would not be a good candidate for NSAIDs. Perhaps colchicine. He is not aware of what his uric acid level is 2/15; his right posterior calf wound seems to be coming in in terms of width. Everything here looks fairly good. No mechanical debridement we have been using Hydrofera Blue. He has had an ablation by vein and vascular. Felt to have adequate arterial supply to this area. The patient got  prednisone last week from Dr. Angelique Holm of nephrology. At my urging he actually took it. The area on his left toe was a lot better. The right toe was not as painful but still erythematous with a wound at the tip. We have been using silver alginate here 2/22; right posterior calf seems to be gradually epithelializing. Still a fairly substantial wound. He still has an area on the tip of his right fifth toe which I think was gout related. This is gradually improving. We have been using Unna boots to wrap X-ray I did of the foot last time was again negative there was soft tissue irregularity about the distal fifth digit but no radiographic evidence of bone damage 3/1; right posterior calf seems to be gradually improving however there were areas of hyper granulation. We have been using Hydrofera Blue. The hyper granulation is mostly evident in the most superior finger shape projection of the wound itself. ooThe area on the right fifth toe also was slough covered and required debridement. 3/8 continued improvement in the right posterior calf and the tip of the right fifth toe. We have been using Hydrofera Blue under compression he  is changing the area to the toe 3/22; continued improvement in the right posterior calf. Unfortunately comes in today with a new skin tear in the mid anterior tibia area this probably had something to do with changing his dressing home health I called and left this a message last week. 3/29; continued improvement in the a substantial wound on the right posterior calf. The skin tear anteriorly from last week is already healed on the tip of the right fifth toe there is still a nonviable area. 4/5; we have continued contraction of the substantial wound on the right posterior calf. We continue to have problems with the tip of the right fifth toe. We have been using Hydrofera Blue to both wound areas He is complaining about some discomfort in the Achilles area. T me he had surgery  for what sounds like a torn Achilles about 10 years ago. ells 4/12; we have continued contraction of the substantial wound on the right posterior calf. ooStill an area on the tip of the right fifth toe. I changed him to silver collagen on the toe last week but I think home health continue to use Hydrofera Blue to both wound areas. 4/19; we continue to have contraction of the substantial wound on the right posterior calf however there continues to be hyper granulation. ooThe area on the tip of the fifth toe is very small still some debris on the surface we have been using silver collagen 4/26; both wounds have contracted. I was hoping the fifth toe wound would close over but with probing there is still an open small hole here. 5/3; his wound on the right posterior calf which was his major area continues to contract looks healthy we have been using Hydrofera Blue. Using silver collagen to the fifth toe, not making a lot of progress here Objective Constitutional Patient is hypertensive.. Pulse regular and within target range for patient.Marland Kitchen Respirations regular, non-labored and within target range.. Temperature is normal and within the target range for the patient.Marland Kitchen Appears in no distress. Vitals Time Taken: 8:18 AM, Height: 67 in, Weight: 190 lbs, BMI: 29.8, Temperature: 98.3 F, Pulse: 52 bpm, Respiratory Rate: 18 breaths/min, Blood Pressure: 165/92 mmHg. General Notes: Wound exam ooRight fifth toe. Debris over the surface removed with a #3 curette skin and subcutaneous tissue. I am able to get down to a healthy looking wound relatively with some depth. The toe itself is in a hammer deformity going medially. I wonder if this is all a pressure issue in the shoe. I have asked our staff to try and straighten the toe initially with some gauze to see if offloading this more aggressively will turn the corner ooThe area of the major wound on the right posterior calf continues to measure smaller.  Healthy looking wound surface Integumentary (Hair, Skin) Wound #3 status is Open. Original cause of wound was Trauma. The wound is located on the Right,Posterior Lower Leg. The wound measures 6.1cm length x 2.7cm width x 0.1cm depth; 12.936cm^2 area and 1.294cm^3 volume. There is Fat Layer (Subcutaneous Tissue) Exposed exposed. There is no tunneling or undermining noted. There is a medium amount of serosanguineous drainage noted. The wound margin is flat and intact. There is large (67-100%) red granulation within the wound bed. There is a small (1-33%) amount of necrotic tissue within the wound bed including Adherent Slough. Wound #5 status is Open. Original cause of wound was Blister. The wound is located on the Right T Fifth. The wound measures 0.2cm length x  0.2cm width x oe 0.1cm depth; 0.031cm^2 area and 0.003cm^3 volume. There is no tunneling or undermining noted. There is a small amount of serous drainage noted. The wound margin is flat and intact. There is no granulation within the wound bed. There is a large (67-100%) amount of necrotic tissue within the wound bed including Adherent Slough. Assessment Active Problems ICD-10 Contusion of right lower leg, subsequent encounter Non-pressure chronic ulcer of right calf with fat layer exposed Chronic venous hypertension (idiopathic) with inflammation of right lower extremity Type 2 diabetes mellitus with other skin ulcer Non-pressure chronic ulcer of other part of right foot with other specified severity Procedures Wound #5 Pre-procedure diagnosis of Wound #5 is a Diabetic Wound/Ulcer of the Lower Extremity located on the Right T Fifth .Severity of Tissue Pre Debridement is: oe Fat layer exposed. There was a Excisional Skin/Subcutaneous Tissue Debridement with a total area of 0.04 sq cm performed by Ricard Dillon., MD. With the following instrument(s): Curette to remove Non-Viable tissue/material. Material removed includes Subcutaneous  Tissue and Biofilm and after achieving pain control using Lidocaine 5% topical ointment. No specimens were taken. A time out was conducted at 08:48, prior to the start of the procedure. A Minimum amount of bleeding was controlled with Pressure. The procedure was tolerated well with a pain level of 0 throughout and a pain level of 0 following the procedure. Post Debridement Measurements: 0.2cm length x 0.2cm width x 0.1cm depth; 0.003cm^3 volume. Character of Wound/Ulcer Post Debridement is improved. Severity of Tissue Post Debridement is: Fat layer exposed. Post procedure Diagnosis Wound #5: Same as Pre-Procedure Wound #3 Pre-procedure diagnosis of Wound #3 is a Venous Leg Ulcer located on the Right,Posterior Lower Leg . There was a Haematologist Compression Therapy Procedure by [name], RN. Post procedure Diagnosis Wound #3: Same as Pre-Procedure Plan Follow-up Appointments: Return Appointment in 1 week. Dressing Change Frequency: Wound #3 Right,Posterior Lower Leg: Other: - twice a week - wound clinic to change 1x a week on Monday, home health to change 1x a week on Thursday or Friday Wound #5 Right T Fifth: oe Other: - twice a week - wound clinic to change 1x a week on Monday, home health to change 1x a week on Thursday or Friday Skin Barriers/Peri-Wound Care: Wound #3 Right,Posterior Lower Leg: Barrier cream Moisturizing lotion - to leg Wound Cleansing: Clean wound with Normal Saline. - or normal saline on days that dressing is changed May shower with protection. Primary Wound Dressing: Wound #3 Right,Posterior Lower Leg: Hydrofera Blue - Classic Wound #5 Right T Fifth: oe Silver Collagen - moisten with hydrogel or KY jelly Secondary Dressing: Wound #3 Right,Posterior Lower Leg: Foam - foam top bend of foot for protection ABD pad Zetuvit or Kerramax - or Xtrasorb Wound #5 Right T Fifth: oe Kerlix/Rolled Gauze Dry Gauze Other: - felt or rolled up gauze under toe to  elevate tip of toe off of shoe Edema Control: Unna Boot to Right Lower Extremity - *****ONLY ONE SINGLE LAYER OF UNNA BOOT***** - foam to achilles to pad Avoid standing for long periods of time Elevate legs to the level of the heart or above for 30 minutes daily and/or when sitting, a frequency of: - throughout the day Exercise regularly Additional Orders / Instructions: Other: - Go to ER if toe/leg gets more red/black, increased pain, fever Home Health: [facility] for wound care. - [facility]. I continued with silver collagen to the right fifth toe a small  wound with some depth right on the tip. Surface of the area looks healthy. We are going to try to more aggressively offload this area as it seems to have a curled hammer deformity pointing medially at the PIP 2. Continue Hydrofera Blue to the right posterior calf. This was original wound and this looks very Physicist, medical) Signed: 01/21/2020 5:33:20 PM By: [name] MD Entered By: [name] on 01/21/2020 09:10:02 -------------------------------------------------------------------------------- SuperBill Details Patient Name: Date of Service: KRISTIAN, MOGG [name] M 01/21/2020 Medical Record Number: [id number] Patient Account Number: [id number] Date of Birth/Sex: Treating RN: 1946/03/09 (74 y.o. 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 E11.622 Type 2 diabetes mellitus with other skin ulcer L97.518 Non-pressure chronic ulcer of other part of right foot with other specified  severity Facility Procedures CPT4 Code: 81840375 Description: 43606 - DEB SUBQ TISSUE 20 SQ CM/< ICD-10 Diagnosis Description L97.518 Non-pressure chronic ulcer of other part of right foot with other specified sev Modifier: erity Quantity: 1 Physician Procedures : CPT4 Code Description Modifier [id number] - WC PHYS SUBQ TISS 20 SQ CM ICD-10 Diagnosis Description L97.518 Non-pressure chronic ulcer of other part of right foot with other specified severity Quantity: 1 Electronic Signature(s) Signed: 01/21/2020 5:33:20 PM By: [name] MD Entered By: [name] on 01/21/2020 09:10:21

## 2020-01-24 ENCOUNTER — Other Ambulatory Visit: Payer: Self-pay

## 2020-01-24 ENCOUNTER — Ambulatory Visit (HOSPITAL_COMMUNITY)
Admission: RE | Admit: 2020-01-24 | Discharge: 2020-01-24 | Disposition: A | Discharge status: Home / Self Care | Payer: Medicare Other | Source: Ambulatory Visit | Attending: Nephrology | Admitting: Nephrology

## 2020-01-24 DIAGNOSIS — D631 Anemia in chronic kidney disease: Secondary | ICD-10-CM | POA: Diagnosis not present

## 2020-01-24 DIAGNOSIS — N189 Chronic kidney disease, unspecified: Secondary | ICD-10-CM | POA: Diagnosis not present

## 2020-01-24 LAB — HEMOGLOBIN AND HEMATOCRIT, BLOOD
HCT: 33.8 % — ABNORMAL LOW (ref 39.0–52.0)
Hemoglobin: 9.9 g/dL — ABNORMAL LOW (ref 13.0–17.0)

## 2020-01-24 MED ORDER — DARBEPOETIN ALFA 200 MCG/0.4ML IJ SOSY
200.0000 ug | PREFILLED_SYRINGE | INTRAMUSCULAR | Status: DC
  Administered 2020-01-24: 200 ug via SUBCUTANEOUS
  Filled 2020-01-24: qty 0.4

## 2020-01-24 NOTE — Progress Notes (Signed)
PATIENT CARE CENTER NOTE  Diagnosis:Anemia associated with Chronic Renal Failure   Provider:Coladonato, Broadus John, MD   Procedure:Aranesp injections   Note:Patient received sub-q Aranesp injectionin left arm. Tolerated injection well. No adverse reaction noted. Pre-injection Hemoglobin9.9and BP 113/57. Discharge paperwork given to patient. Alert, oriented and ambulatory at discharge.

## 2020-01-24 NOTE — Discharge Instructions (Signed)
Darbepoetin Alfa injection What is this medicine? DARBEPOETIN ALFA (dar be POE e tin AL fa) helps your body make more red blood cells. It is used to treat anemia caused by chronic kidney failure and chemotherapy. This medicine may be used for other purposes; ask your health care provider or pharmacist if you have questions. COMMON BRAND NAME(S): Aranesp What should I tell my health care provider before I take this medicine? They need to know if you have any of these conditions:  blood clotting disorders or history of blood clots  cancer patient not on chemotherapy  cystic fibrosis  heart disease, such as angina, heart failure, or a history of a heart attack  hemoglobin level of 12 g/dL or greater  high blood pressure  low levels of folate, iron, or vitamin B12  seizures  an unusual or allergic reaction to darbepoetin, erythropoietin, albumin, hamster proteins, latex, other medicines, foods, dyes, or preservatives  pregnant or trying to get pregnant  breast-feeding How should I use this medicine? This medicine is for injection into a vein or under the skin. It is usually given by a health care professional in a hospital or clinic setting. If you get this medicine at home, you will be taught how to prepare and give this medicine. Use exactly as directed. Take your medicine at regular intervals. Do not take your medicine more often than directed. It is important that you put your used needles and syringes in a special sharps container. Do not put them in a trash can. If you do not have a sharps container, call your pharmacist or healthcare provider to get one. A special MedGuide will be given to you by the pharmacist with each prescription and refill. Be sure to read this information carefully each time. Talk to your pediatrician regarding the use of this medicine in children. While this medicine may be used in children as young as 1 month of age for selected conditions, precautions do  apply. Overdosage: If you think you have taken too much of this medicine contact a poison control center or emergency room at once. NOTE: This medicine is only for you. Do not share this medicine with others. What if I miss a dose? If you miss a dose, take it as soon as you can. If it is almost time for your next dose, take only that dose. Do not take double or extra doses. What may interact with this medicine? Do not take this medicine with any of the following medications:  epoetin alfa This list may not describe all possible interactions. Give your health care provider a list of all the medicines, herbs, non-prescription drugs, or dietary supplements you use. Also tell them if you smoke, drink alcohol, or use illegal drugs. Some items may interact with your medicine. What should I watch for while using this medicine? Your condition will be monitored carefully while you are receiving this medicine. You may need blood work done while you are taking this medicine. This medicine may cause a decrease in vitamin B6. You should make sure that you get enough vitamin B6 while you are taking this medicine. Discuss the foods you eat and the vitamins you take with your health care professional. What side effects may I notice from receiving this medicine? Side effects that you should report to your doctor or health care professional as soon as possible:  allergic reactions like skin rash, itching or hives, swelling of the face, lips, or tongue  breathing problems  changes in   vision  chest pain  confusion, trouble speaking or understanding  feeling faint or lightheaded, falls  high blood pressure  muscle aches or pains  pain, swelling, warmth in the leg  rapid weight gain  severe headaches  sudden numbness or weakness of the face, arm or leg  trouble walking, dizziness, loss of balance or coordination  seizures (convulsions)  swelling of the ankles, feet, hands  unusually weak or  tired Side effects that usually do not require medical attention (report to your doctor or health care professional if they continue or are bothersome):  diarrhea  fever, chills (flu-like symptoms)  headaches  nausea, vomiting  redness, stinging, or swelling at site where injected This list may not describe all possible side effects. Call your doctor for medical advice about side effects. You may report side effects to FDA at 1-800-FDA-1088. Where should I keep my medicine? Keep out of the reach of children. Store in a refrigerator between 2 and 8 degrees C (36 and 46 degrees F). Do not freeze. Do not shake. Throw away any unused portion if using a single-dose vial. Throw away any unused medicine after the expiration date. NOTE: This sheet is a summary. It may not cover all possible information. If you have questions about this medicine, talk to your doctor, pharmacist, or health care provider.  2020 Elsevier/Gold Standard (2017-09-21 16:44:20)  

## 2020-01-25 NOTE — Progress Notes (Signed)
CARDIOLOGY OFFICE NOTE  Date:  01/29/2020    [name] Date of Birth: 1946/03/19 Medical Record #[id number]  PCP:  [name], MD  Cardiologist:  Tamala Julian  Chief Complaint  Patient presents with  . Follow-up    History of Present Illness: [name] is a 74 y.o. male who presents today for a 9 month check. Seen for Dr. Tamala Julian.   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  Last seen in August of 2020 by Dr. Tamala Julian - had noted improvement with DOE/orthopnea with increase in diuretics.    The patient does not have symptoms concerning for COVID-19 infection (fever, chills, cough, or new shortness of breath).   Comes in today. Here alone. He had a fall back in the fall while mopping - did not pass out - had a bruise on the back of his right leg - this turned into an ulcer - he was sent to orthopedics and to the wound clinic - says there was discussion about amputation but his has now almost completely healed - he is in an orthopedic boot. No chest pain. Breathing is "pretty good". No swelling. Says he is getting good reports with Dr. Marval Regal. He remains on chronic steroid therapy for his transplant. Tough being at home. He has had his first COVID vaccine as of last week he was pretty hesitant initially.   Past Medical History:  Diagnosis Date  . Anemia   . Atrial fibrillation ([place])   . Chronic kidney disease   . Diabetes mellitus   . Dysrhythmia   . Hyperlipidemia   . Neuropathy   . Wound of left leg 08/2018    Past Surgical History:  Procedure Laterality Date  . ABDOMINAL AORTOGRAM W/LOWER EXTREMITY N/A 10/12/2019   Procedure: ABDOMINAL AORTOGRAM W/LOWER EXTREMITY;  Surgeon: Angelia Mould, MD;  Location: [facility];  Service: Cardiovascular;  Laterality: N/A;  With CO2  . access proceds    . achlles tendon repair    . ARTERIOVENOUS GRAFT PLACEMENT    . bowel repla     repair  . ENDOVENOUS ABLATION SAPHENOUS VEIN W/ LASER Right 10/04/2019   endovenous laser ablation right greater saphenous vein by Gae Gallop MD   . [place] and 2005  . LIGATION GORETEX GRAFT  06/30/11   Right upper arm     Medications: Current Meds  Medication Sig  . acetaminophen (TYLENOL) 500 MG tablet Take 1,000 mg by mouth every 8 (eight) hours as needed for moderate pain.   . Alcohol Swabs (B-D SINGLE USE SWABS REGULAR) PADS Use as directed  . aspirin 81 MG tablet Take 81 mg by mouth daily.    Marland Kitchen atorvastatin (LIPITOR) 10 MG tablet Take 10 mg by mouth daily.  Marland Kitchen BAYER CONTOUR NEXT TEST test strip Use as directed  . BD PEN NEEDLE NANO U/F 32G X 4 MM MISC Use as directed  . calcitRIOL (ROCALTROL) 0.25 MCG capsule Take 0.25 mcg by mouth daily. daily  . carvedilol (COREG) 6.25 MG tablet TAKE 1 TABLET TWICE A DAY WITH MEALS (PLEASE CALL [phone] TO SET UP YEARLY APPOINTMENT WITH DR Tamala Julian FOR FURTHER REFILLS. THANK YOU)  . diphenhydrAMINE (BENADRYL) 25 mg capsule Take 1 capsule (25 mg total) by mouth at bedtime as  needed (for sleep/allergies.).  Marland Kitchen furosemide (LASIX) 40 MG tablet Take 80 mg by mouth daily. Fluid.  Marland Kitchen gabapentin (NEURONTIN) 100 MG capsule Take 100 mg by mouth 3 (three) times daily as needed (for nerve pain). Take three times/day as needed  . HUMALOG KWIKPEN 100 UNIT/ML KwikPen Inject 3-5 Units into the skin 3 (three) times daily.  . hydrALAZINE (APRESOLINE) 25 MG tablet Take 25 mg by mouth 3 (three) times daily.   Marland Kitchen LANTUS SOLOSTAR 100 UNIT/ML Solostar Pen Inject 7-15 Units into the skin daily. If blood sugars are >300, use 15 units, <300 use 7 units  . latanoprost (XALATAN) 0.005 % ophthalmic solution Place 1 drop into both eyes at bedtime.  Marland Kitchen linagliptin (TRADJENTA) 5 MG TABS tablet Take  5 mg by mouth daily.    Marland Kitchen LORazepam (ATIVAN) 1 MG tablet Take 1 tablet prior to leaving house on day of procedure. Bring second tablet with you to office on day of procedure.  Marland Kitchen MICROLET LANCETS MISC Use as directed  . mycophenolate (CELLCEPT) 500 MG tablet Take 500 mg by mouth 2 (two) times daily.   . predniSONE (DELTASONE) 5 MG tablet Take 5 mg by mouth daily.  . tacrolimus (PROGRAF) 1 MG capsule Take 1 mg by mouth 2 (two) times daily.   Marland Kitchen VASCEPA 1 g CAPS Take 4 g by mouth daily.     Allergies: Allergies  Allergen Reactions  . Elavil [Amitriptyline Hcl] Other (See Comments)    Unknown felt as if he was tripping    Social History: The patient  reports that he has never smoked. He has never used smokeless tobacco. He reports that he does not drink alcohol or use drugs.   Family History: The patient's family history includes Heart attack in his father; Heart disease in his mother; Stroke in his sister.   Review of Systems: Please see the history of present illness.   All other systems are reviewed and negative.   Physical Exam: VS:  BP (!) 146/70   Pulse 67   Ht 5\' 8"  (1.727 m)   Wt 191 lb 12.8 oz (87 kg)   SpO2 98%   BMI 29.16 kg/m  .  BMI Body mass index is 29.16 kg/m.  Wt Readings from Last 3 Encounters:  01/29/20 191 lb 12.8 oz (87 kg)  10/12/19 190 lb (86.2 kg)  10/11/19 190 lb (86.2 kg)    General: Alert. He is in no acute distress. Looks little chronically ill. His weight is down from 199 back in August.  HEENT: Normal.  Neck: Supple, no JVD, carotid bruits, or masses noted.  Cardiac: Little irregular - heart tones are distant. Chronic edema.  Respiratory:  Decreased breath sounds but with normal work of breathing.  GI: Soft and nontender.  MS: No deformity or atrophy. Gait and ROM intact.  Skin: Warm and dry. Color is normal.  Neuro:  Strength and sensation are intact and no gross focal deficits noted.  Psych: Alert, appropriate and with normal  affect.   LABORATORY DATA:  EKG:  EKG is not ordered today.    Lab Results  Component Value Date   WBC 10.5 09/10/2018   HGB 9.9 (L) 01/24/2020   HCT 33.8 (L) 01/24/2020   PLT 107 (L) 09/10/2018   GLUCOSE 108 (H) 10/12/2019   ALT 13 09/05/2018   AST 30 09/05/2018   NA 142 10/12/2019   K 3.9 10/12/2019   CL 113 (H) 10/12/2019   CREATININE 3.30 (H) 10/12/2019  BUN 68 (H) 10/12/2019   CO2 20 03/01/2019   TSH 2.422 09/06/2018   HGBA1C 8.8 (H) 07/01/2011       BNP (last 3 results) No results for input(s): BNP in the last 8760 hours.  ProBNP (last 3 results) Recent Labs    03/01/19 0833  PROBNP 2,013*     Other Studies Reviewed Today:  2D Doppler echocardiogram June 2020: IMPRESSIONS  1. The left ventricle has normal systolic function with an ejection fraction of 60-65%. The cavity size was normal. There is mildly increased left ventricular wall thickness. Left ventricular diastolic function could not be evaluated secondary to atrial fibrillation. 2. The LV endocardium is very bright. Likely due to ESRD. Consider amyloidosis. 3. The right ventricle has moderately reduced systolic function. The cavity was normal. There is mildly increased right ventricular wall thickness. Right ventricular systolic pressure is moderately elevated with an estimated pressure of 49.3 mmHg. 4. Left atrial size was mildly dilated. 5. No evidence of mitral valve stenosis. 6. Aortic valve regurgitation is mild by color flow Doppler. No stenosis of the aortic valve. 7. There is mild dilatation of the aortic root and of the ascending aorta. 8. Pulmonary hypertension is moderate. 9. The inferior vena cava was dilated in size with <50% respiratory variability. 10. The interatrial septum was not assessed.   Let the patient know that pressure in lungs is high as expected. Needs increased diuretic therapy. Speak to Dr. Arty Baumgartner to see if okay. He will need to make those  changes. A copy will be sent to [name], MD  ASSESSMENT & PLAN:    1. Chronic diastolic HF  - weight is down - looks good clinically and he is happy with how he is doing. Breathing status is stable.   2. Chronic atrial flutter - noted on prior EKGs - looks to be managed with rate control and he is not on anticoagulation - he says he had remote bleeding many years ago - does not wish to be on Coumadin. Only on aspirin. Discussed with Dr. Tamala Julian and we will continue with this plan of care.   3. HTN - recheck by me is 130/70. He has not had any medicines today. I have left him on his Coreg - this is refilled today.   4. HLD - on statin therapy - lab from January noted.   5. ESRD/CKD - plan per Renal.   6. DM - last A1C under 7  7. COVID-19 Education: The signs and symptoms of COVID-19 were discussed with the patient and how to seek care for testing (follow up with PCP or arrange E-visit).  The importance of social distancing, staying at home, hand hygiene and wearing a mask when out in public were discussed today. He has had his first vaccine.   Current medicines are reviewed with the patient today.  The patient does not have concerns regarding medicines other than what has been noted above.  The following changes have been made:  See above.  Labs/ tests ordered today include:   No orders of the defined types were placed in this encounter.    Disposition:   FU with [place] in 6 months.    Patient is agreeable to this plan and will call if any problems develop in the interim.   SignedTruitt Merle, NP  01/29/2020 8:26 AM  [facility] [address] [place], [place]  [address] Phone: [phone] Fax: [phone]

## 2020-01-28 ENCOUNTER — Encounter (HOSPITAL_BASED_OUTPATIENT_CLINIC_OR_DEPARTMENT_OTHER): Payer: Medicare Other | Admitting: Internal Medicine

## 2020-01-28 DIAGNOSIS — E11621 Type 2 diabetes mellitus with foot ulcer: Secondary | ICD-10-CM | POA: Diagnosis not present

## 2020-01-29 ENCOUNTER — Ambulatory Visit (INDEPENDENT_AMBULATORY_CARE_PROVIDER_SITE_OTHER): Payer: Medicare Other | Admitting: Nurse Practitioner

## 2020-01-29 ENCOUNTER — Other Ambulatory Visit: Payer: Self-pay

## 2020-01-29 ENCOUNTER — Encounter: Payer: Self-pay | Admitting: Nurse Practitioner

## 2020-01-29 VITALS — BP 146/70 | HR 67 | Ht 68.0 in | Wt 191.8 lb

## 2020-01-29 DIAGNOSIS — N186 End stage renal disease: Secondary | ICD-10-CM

## 2020-01-29 DIAGNOSIS — L97319 Non-pressure chronic ulcer of right ankle with unspecified severity: Secondary | ICD-10-CM

## 2020-01-29 DIAGNOSIS — R0600 Dyspnea, unspecified: Secondary | ICD-10-CM | POA: Diagnosis not present

## 2020-01-29 DIAGNOSIS — I484 Atypical atrial flutter: Secondary | ICD-10-CM

## 2020-01-29 DIAGNOSIS — I70261 Atherosclerosis of native arteries of extremities with gangrene, right leg: Secondary | ICD-10-CM | POA: Diagnosis not present

## 2020-01-29 DIAGNOSIS — I5032 Chronic diastolic (congestive) heart failure: Secondary | ICD-10-CM

## 2020-01-29 DIAGNOSIS — I10 Essential (primary) hypertension: Secondary | ICD-10-CM | POA: Diagnosis not present

## 2020-01-29 DIAGNOSIS — Z7189 Other specified counseling: Secondary | ICD-10-CM

## 2020-01-29 MED ORDER — CARVEDILOL 6.25 MG PO TABS: 6.2500 mg | ORAL_TABLET | Freq: Two times a day (BID) | ORAL | 3 refills | Status: AC

## 2020-01-29 NOTE — Patient Instructions (Addendum)
After Visit Summary:  We will be checking the following labs today - NONE   Medication Instructions:    Continue with your current medicines.   I did [facility]   If you need a refill on your cardiac medications before your next appointment, please call your pharmacy.     Testing/Procedures To Be Arranged:  N/A  Follow-Up:   See Dr. Tamala Julian in 6 months -  You will receive a reminder letter in the mail two months in advance. If you don't receive a letter, please call our office to schedule the follow-up appointment.     At [name], you and your health needs are our priority.  As part of our continuing mission to provide you with exceptional heart care, we have created designated Provider Care Teams.  These Care Teams include your primary Cardiologist (physician) and Advanced Practice Providers (APPs -  Physician Assistants and Nurse Practitioners) who all work together to provide you with the care you need, when you need it.  Special Instructions:  . Stay safe, stay home, wash your hands for at least 20 seconds and wear a mask when out in public.  . It was good to talk with you today.    Call the [place] office at [phone] if you have any questions, problems or concerns.

## 2020-01-29 NOTE — Progress Notes (Signed)
KOI, ZANGARA ([id number]) Visit Report for 01/28/2020 Debridement Details Patient Name: Date of Service: [name], [name] 01/28/2020 8:15 [name] Medical Record Number: [id number] Patient Account Number: [id number] Date of Birth/Sex: Treating RN: 17-Oct-1945 (74 y.o. [name] Primary Care Provider: [name] Other Clinician: Referring Provider: Treating Provider/Extender: [name] in Treatment: 25 Debridement Performed for Assessment: Wound #5 Right T Fifth oe Performed By: Physician Ricard Dillon., MD Debridement Type: Debridement Severity of Tissue Pre Debridement: Fat layer exposed Level of Consciousness (Pre-procedure): Awake and Alert Pre-procedure Verification/Time Out Yes - 08:42 Taken: Start Time: 08:42 T Area Debrided (L x W): otal 0.2 (cm) x 0.2 (cm) = 0.04 (cm) Tissue and other material debrided: Viable, Non-Viable, Callus, Subcutaneous Level: Skin/Subcutaneous Tissue Debridement Description: Excisional Instrument: Curette Bleeding: Minimum Hemostasis Achieved: Pressure End Time: 08:43 Procedural Pain: 0 Post Procedural Pain: 0 Response to Treatment: Procedure was tolerated well Level of Consciousness (Post- Awake and Alert procedure): Post Debridement Measurements of Total Wound Length: (cm) 0.2 Width: (cm) 0.2 Depth: (cm) 0.1 Volume: (cm) 0.003 Character of Wound/Ulcer Post Debridement: Improved Severity of Tissue Post Debridement: Fat layer exposed Post Procedure Diagnosis Same as Pre-procedure Electronic Signature(s) Signed: 01/29/2020 8:11:21 AM By: [name] MD Signed: 01/29/2020 5:17:28 PM By: [name] RN, BSN Entered By: [name] on 01/28/2020 08:48:40 -------------------------------------------------------------------------------- HPI Details Patient Name: Date of Service: [name], [name] 01/28/2020 8:15 [name] Medical Record Number: [id number] Patient Account Number:  [id number] Date of Birth/Sex: Treating RN: 06-28-1946 (74 y.o. [name] Primary Care Provider: [name] Other Clinician: Referring Provider: Treating Provider/Extender: [name] in Treatment: 25 History of Present Illness HPI Description: [name] HPI Description: 74 year old gentleman who was recently seen by his nephrologist [name], and noted to have a wound on his left lower extremity which was lacerated 2 months ago and now has reopened. The patient's left shin has a ulceration with some exudate but no evidence of infection and he was referred to [place] for further care as it was known that the patient has had some peripheral vascular disease in the past. Past medical history significant for chronic kidney disease, atrial fibrillation, diabetes mellitus,status post kidney transplant in 1983 and 2005, a week fistula graft placement, status post previous bowel surgery. 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 10/06/2015 -- x-ray of the left tibia and fibula shows no evidence of osteomyelitis. The patient has also had Doppler studies of his extremity and is awaiting the appointment with the vascular surgeon. We have not yet received these reports. 10/13/2015 -- lower extremity venous duplex reflux evaluation shows reflux in the left common femoral vein, left saphenofemoral junction and the proximal greater saphenous vein extending to the proximal calf. There is also reflux in the left proximal to mid small saphenous vein. Arterial duplex studies done showed the resting ABI was not applicable due to tibial artery medial calcification. 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 The right TBI was 1 which is normal and the left ABI was 1 which is normal. Patient has otherwise  been doing fine and has been compliant with his dressings. 10/20/2015 -- He was seen by [name] recently for a vascular opinion on 10/15/2015. His left lower extremity venous insufficiency duplex  study revealed GSV reflux,SS vein reflux and deep venous reflux in the common femoral vein. His ABIs were non compressible and his TBI on the right was 1.01 and on the left was 0.80. He was asked to continue with the wound care with compressive therapy followed by EVLA of the left GS vein 3 months. He recommended 20-30 mm thigh- high compression stockings and the need for a three-month trial of this. The patient had an Unna boot applied at the vascular office but he could not tolerate this with a lot of pain and issues with his toes and hence came here on Friday for removal of this and we reapplied a 2 layer compression. 11/10/2015 -- patient still has not purchased his 20-30 mm thigh-high compression stockings as prescribed by [name]. Readmission: 08/08/18 on evaluation today patient presents for readmission concerning a new injury to the left anterior lower extremity. He was previously seen in 2017 here in our clinic. He states that he has done fairly well since that point. Nonetheless he is having at this time some pain but states that he hit this on a table that fell over and actually struck his leg. This appears to have pulled back some of his skin which folded in on itself and is causing some difficulty as far as that is concerned. There does not appear to be any evidence of infection at this time. No fevers, chills, nausea, or vomiting noted at this time. He's been using dressings on his own currently without complication. 08/15/18 on evaluation today patient actually appears to be doing somewhat better in regard to his wound of the lower [name] when compared to the first visit last week. I had to do a much more extensive debridement at that time it does appear that I'[name] gonna have to perform  some debridement today but it does not look to be as extensive by any means. Nonetheless fortunately he does not show any signs of infection he does have discomfort at this site. I believe based on what I'[name] seeing currently he may benefit from Iodoflex to help keep the wound bed clean. Patient tolerated therapy without complication. Upon evaluation today the patient actually appears to be doing excellent in regard to his left lower extremity ulcer. This is much better than the previous two visits where he had a lot of necrotic tissue around the edge of the wound simply due to the fact that again there was a significant skin tear where the edge had been cleared away prior to reattaching and being able to heal appropriately. He seems to be doing much better at this point. 08/28/18 on evaluation today the patient's wound actually does appear to be showing signs of improvement. With that being said though he is improving he would likely note even greater improvement if we were able to sharply debride the wound. Nonetheless this caused him to much discomfort he tells me. 09/04/18 on evaluation today patient actually appears to be showing signs of improvement in regard to his left lower extremity ulcer. He has been tolerating the dressing changes including the wrap although he tells me at this point that the burning does last for a couple of days even with just the Iodoflex. I was afraid that this may been part of the issue that he was having with discomfort. It does seem to be the case. Nonetheless he shows no signs of evidence of infection at this time which is good news. No fevers chills noted ADMISSION to [facility]  wound care clinic 10/05/2018 This is a patient who was cared for in 2017 and again in the fall of this year at our sister clinic and [facility]. He actually lives in [place] in [place]. We have been dealing with an apparent traumatic area on the left anterior tibial area. This is been  present for the last several months. He was supposed to be using Iodoflex Kerlix and Coban however he was hospitalized from 09/05/2018 through 09/11/2018 with delirium secondary to pneumonia. Since then he is only been putting Vaseline gauze on this without compression. He also has a more recent skin tear on the dorsal right hand that may have only happened in the last week. The patient had arterial studies done in 2017 in January which was 3 years ago. At that point he had noncompressible ABIs but really quite good TBI's both normal. Triphasic waveforms on the right monophasic at the left posterior tibial but triphasic at the left dorsalis pedis. His ABIs in our clinic today were both noncompressible 1/23; the patient has wounds on the right dorsal hand just distal to the wrist and on the left anterior lower extremity. Both of these look very healthy he is using Hydrofera Blue 1/30; left anterior lower extremity wound much smaller. Healthy looking surface. The laceration area just distal to the wrist on the dorsal hand on the right is also just about closed I used Hydrofera Blue here 2/6; left anterior lower extremity wound is much smaller but still open. The laceration area just distal to the wrist on the dorsal hand is fully epithelialized. 2/13; the patient's anterior lower extremity wound is closed. The laceration just distal to the wrist on the dorsal hand is also fully epithelialized and closed. The patient has external compression stockings which I think are 20/30 READMISSION 08/06/2019 [name] is a 74 year old man with had several times previously in our clinic. He is a diabetic with a history of chronic renal insufficiency status post kidney transplant in 1983 and again in 2005. He was then in 2017 with a laceration on the left lower extremity. He was worked up at the time with arterial studies and reflux studies. The arterial studies showed ABIs to be noncompressible but TBI's were  within normal limits. I do not have the reflux studies at the moment. He was also sent here in 2019 with a left lower extremity wound and then again in 2020 with left lower extremity trauma a skin tear on the wrist. He was discharged with 20/30 stockings identified from myself that that might not be enough compression. Nevertheless he states he was wearing these fairly reliably. In September he had a fall with a substantial bruise in the area of the wound. He says he saw orthopedics and they told him there was some muscle strain sometime it later this opened into a wound. He has a fairly substantial wound on the right posterior calf. Satellite areas around this including medially and posteriorly. He has not worn his stockings since the injury Past medical history; includes chronic renal failure secondary to diabetes with kidney transplant x2, atrial fibrillation, heart failure with preserved ejection fraction, coronary artery disease. ABIs on the right in our clinic were once again noncompressible 08/13/2019 on evaluation today patient appears to be doing decently well with regard to his wound compared to last week's evaluation. Unfortunately he is still having a lot of discomfort at this point which is I think in some part due to the 3 layer compression wrap which is  a little bit stronger I think for him. When he was here before we actually utilized a Kerlix and Coban wrap which he states seemed to got a little bit better. Nonetheless I think we can probably drop back to this in light of the discomfort that he had. Nonetheless the pain was not really right around the wound itself as much as it was around the ankle in particular. The Iodoflex does seem to have done well for him As the wound is appearing somewhat better today which is excellent news. 11/30; still complaining of a lot of pain. Apparently arterial studies I ordered 2 weeks ago are below. I do not believe we have an appointment with vein  and vascular as of yet; ABI Findings: +---------+------------------+-----+----------+--------+ Right Rt Pressure (mmHg)IndexWaveform Comment  +---------+------------------+-----+----------+--------+ PTA >254 1.50 monophasic  +---------+------------------+-----+----------+--------+ DP >254 1.50 monophasic  +---------+------------------+-----+----------+--------+ Great T oe68 0.40 Abnormal   +---------+------------------+-----+----------+--------+ +---------+------------------+-----+----------+-------+ Left Lt Pressure (mmHg)IndexWaveform Comment +---------+------------------+-----+----------+-------+ Brachial 169     +---------+------------------+-----+----------+-------+ PTA >254 1.50 monophasic  +---------+------------------+-----+----------+-------+ DP >254 1.50 monophasic  +---------+------------------+-----+----------+-------+ Great T oe40 0.24 Abnormal   +---------+------------------+-----+----------+-------+ Pedal arteries appear hyperemic. Patient refused Brachial pressure in the right arm. Summary: Right: Resting right ankle-brachial index indicates noncompressible right lower extremity arteries. The right toe-brachial index is abnormal. Left: Resting left ankle-brachial index indicates noncompressible left lower extremity arteries. The left toe-brachial index is abnormal. He is also having considerably more swelling in his left calf. This was not there when I last saw him 2 weeks ago. He tells me that some of the home health compression wraps have been slipping down and that may be the issue here however a month I am uncertain 14/4; sees vascular on December 22. Still complaining of a lot of pain. DVT study I did last time was negative for DVT 12/14; still complaining of pain which if this is arterial is certainly claudication and rest keeps him uncomfortable at night. He has an appointment with vein and vascular on  December 22. Wound surface is better using Iodoflex. Once the surface of this is satisfactory and we have exhausted the vascular route. Perhaps an advanced treatment option. He has a configuration of the venous ulceration although his arterial studies are not very good. The other issue is the patient has a transplanted kidney. This will make angiography difficult and challenging issue 12/21; still complaining of pain and drainage. We are using Iodoflex on the wound under compression. He sees Dr. Donnetta Hutching tomorrow to evaluate his noninvasive studies noted above. He has a transplanted kidney further complicating the options for angiography. 12/28; still using Iodoflex under compression. I have Dr. Luther Parody note from 12/22. he noted arterial studies revealing monophasic waveforms at the pedal vessels bilaterally and calcified vessels making the ABI unreliable. He did not comment on the reduced TBI's. He felt these were venous wounds based on the palpable dorsalis pedis pulse. 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 And they arranged for formal venous duplex with reflux studies in the next several weeks. Follow-up with either Scot Dock or Dr. Oneida Alar 09/24/2019 still using Iodoflex under compression. He has an appointment with Dr. Doren Custard on 1/7 with regards to his venous disease. The patient was not felt to have a primary arterial problem for the nonhealing of his wound. We did attempt to wrap him with 3 layer when he first came into the clinic he complained of a lot of pain in the ankle although this may have been because the dressing fell down somewhat. He  has far too edema fluid in the right leg for a good prognosis about healing this wound He comes in today with an excoriation on the bottom part of his right fifth toe. He thinks he may have done this taking off his clothes and that something got caught on the toe. There is no open wound however the toe itself is very painful 1/11; we are using Iodoflex  under compression. The wound bed is clean. He went on to see Dr. Doren Custard on 09/27/2019 he again feels that the patient's arterial supply is adequate. He feels that he might benefit from right greater saphenous vein ablation for a venous ulcer. In the meantime the area that he was complaining about last week on the right fifth toe. An x-ray that I ordered showed marked soft tissue swelling along the distal aspect of the right foot but there was no evidence of osteomyelitis. He comes in today with the fifth toenail just about coming off. He has black eschar underneath the toe on the plantar aspect. The toe was swollen red and very painful. In the right setting this could be a significant soft tissue infection versus ischemia of the toe itself. He did not show this to Dr. Doren Custard 1/18; I am using Iodoflex under compression a large wound on the right posterior calf. He had his right greater saphenous vein ablation by Dr. Doren Custard although I am not sure that is the only problem here. The right fifth toe which was possibly trauma 2 weeks ago continues to be exceptionally painful with a necrotic tip. Maybe not quite as swollen. I started him empirically on Augmentin 5/125 1 p.o. twice daily last week after discussing this with Dr. Loletha Grayer of nephrology. Perhaps somewhat better this week but not as good as I was hoping. A plain x-ray was negative. He comes in today with an area on the medial right calf that was blistered and now open. In looking at things he appears to be systemically fluid volume overloaded. He has a transplanted kidney. He states he takes his Lasix variably when he has appointments he tries not to take it the later I am really not certain if he takes this reliably. However he has far too much edema fluid in the right leg to easily heal this wound and he appears to be developing blisters medially to form additional wounds 1/25; his CO2 angiogram was done by Dr. Doren Custard last week. This showed the proximal  arteries all to be patent. On the right side the common femoral deep femoral superficial femoral popliteal anterior tibial and peroneal arteries were patent. The posterior tibial was occluded but reconstituted distally via collaterals from the peroneal artery he was felt he should have enough blood flow to heal his wounds including the right fifth toe. The right fifth toe looks better however he is still complaining of a lot of pain. The large area which is a venous ulceration posteriorly has a better looking surface I think we can switch to Hydrofera Blue today 2/1; the patient's original wounds on the right posterior medial calf has come down in width however superior to this he has new denuded areas and I am concerned we simply do not have enough edema control. He has already undergone a right greater saphenous ablation. He had a CO2 angiogram done by Dr. Doren Custard and the comment was that we should have enough blood flow to heal the venous wound however we simply do not seem to have enough edema control with 3  layer compression. The patient is status post kidney transplant although his exact renal function is not really clear. Nor am i sure what dose of diuretic he is supposed to be on. He also has the area on the right fifth toe which was unclear etiology but I think became secondarily infected I gave him 2 weeks of antibiotics for this and this seems to have settled down he still has a black eschar over the tip of the toe. X-ray was negative for fracture. He says he has a history of gout. 2/8; the patient's wound on the right posterior calf is about the same. The superficial area medially also about the same. He got a prescription for prednisoneo Gout after he developed erythema on the dorsal aspect of his left great toe going along with the right fifth toe which has been problematic all along. He has not taken it because he is concerned about increasing CBGs. He has a transplanted kidney is already  on prednisone 5 mg. He would not be a good candidate for NSAIDs. Perhaps colchicine. He is not aware of what his uric acid level is 2/15; his right posterior calf wound seems to be coming in in terms of width. Everything here looks fairly good. No mechanical debridement we have been using Hydrofera Blue. He has had an ablation by vein and vascular. Felt to have adequate arterial supply to this area. The patient got prednisone last week from Dr. Angelique Holm of nephrology. At my urging he actually took it. The area on his left toe was a lot better. The right toe was not as painful but still erythematous with a wound at the tip. We have been using silver alginate here 2/22; right posterior calf seems to be gradually epithelializing. Still a fairly substantial wound. He still has an area on the tip of his right fifth toe which I think was gout related. This is gradually improving. We have been using Unna boots to wrap X-ray I did of the foot last time was again negative there was soft tissue irregularity about the distal fifth digit but no radiographic evidence of bone damage 3/1; right posterior calf seems to be gradually improving however there were areas of hyper granulation. We have been using Hydrofera Blue. The hyper granulation is mostly evident in the most superior finger shape projection of the wound itself. The area on the right fifth toe also was slough covered and required debridement. 3/8 continued improvement in the right posterior calf and the tip of the right fifth toe. We have been using Hydrofera Blue under compression he is changing the area to the toe 3/22; continued improvement in the right posterior calf. Unfortunately comes in today with a new skin tear in the mid anterior tibia area this probably had something to do with changing his dressing home health I called and left this a message last week. 3/29; continued improvement in the a substantial wound on the right posterior calf.  The skin tear anteriorly from last week is already healed on the tip of the right fifth toe there is still a nonviable area. 4/5; we have continued contraction of the substantial wound on the right posterior calf. We continue to have problems with the tip of the right fifth toe. We have been using Hydrofera Blue to both wound areas He is complaining about some discomfort in the Achilles area. T me he had surgery for what sounds like a torn Achilles about 10 years ago. ells 4/12; we have continued contraction  of the substantial wound on the right posterior calf. Still an area on the tip of the right fifth toe. I changed him to silver collagen on the toe last week but I think home health continue to use Hydrofera Blue to both wound areas. 4/19; we continue to have contraction of the substantial wound on the right posterior calf however there continues to be hyper granulation. The area on the tip of the fifth toe is very small still some debris on the surface we have been using silver collagen 4/26; both wounds have contracted. I was hoping the fifth toe wound would close over but with probing there is still an open small hole here. 5/3; his wound on the right posterior calf which was his major area continues to contract looks healthy we have been using Hydrofera Blue. Using silver collagen to the fifth toe, not making a lot of progress here 5/10; right posterior calf wound continues to be smaller in terms of surface area and look healthy we have been using Hydrofera Blue here. The tip of the right fifth toe is still open requiring debridement. He has a new laceration on the right forearm he says he hit this on a microwave 6 days ago Electronic Signature(s) Signed: 01/29/2020 8:11:21 AM By: [name] MD Entered By: [name] on 01/28/2020 08:49:30 -------------------------------------------------------------------------------- Physical Exam Details Patient Name: Date of  Service: [name], [name] 01/28/2020 8:15 [name] Medical Record Number: [id number] Patient Account Number: [id number] Date of Birth/Sex: Treating RN: [birth year] (74 y.o. [name] Primary Care Provider: [name] Other Clinician: Referring Provider: Treating Provider/Extender: [name] in Treatment: 25 Constitutional Patient is hypertensive.. Pulse regular and within target range for patient.Marland Kitchen Respirations regular, non-labored and within target range.. Temperature is normal and within the target range for the patient.Marland Kitchen Appears in no distress. Notes Wound exam Right fifth toe. Again debris over the surface subcutaneous material removed from the wound edge as well as callus. Hemostasis with direct pressure. Right posterior calf healthy looking wound reduce surface area washed off with Anasept and gauze looks very healthy. Laceration injury on the right dorsal forearm just above the wrist. Also debrided with wound cleanser and gauze. Electronic Signature(s) Signed: 01/29/2020 8:11:21 AM By: [name] MD Entered By: [name] on 01/28/2020 08:51:07 -------------------------------------------------------------------------------- Physician Orders Details Patient Name: Date of Service: [name], [name] 01/28/2020 8:15 [name] Medical Record Number: [id number] Patient Account Number: [id number] Date of Birth/Sex: Treating RN: 1946-07-09 (74 y.o. [name] Primary Care Provider: [name] Other Clinician: Referring Provider: Treating Provider/Extender: [name] in Treatment: 25 Verbal / Phone Orders: No Diagnosis Coding ICD-10 Coding Code Description S80.11XD Contusion of right lower leg, subsequent encounter L97.212 Non-pressure chronic ulcer of right calf with fat layer exposed I87.321 Chronic venous hypertension (idiopathic) with inflammation of right lower  extremity E11.622 Type 2 diabetes mellitus with other skin ulcer L97.518 Non-pressure chronic ulcer of other part of right foot with other specified severity Follow-up Appointments Return Appointment in 1 week. Dressing Change Frequency Wound #3 Right,Posterior Lower Leg Other: - twice a week - wound clinic to change 1x a week on Monday, home health to change 1x a week on Thursday or Friday Wound #5 Right T Fifth oe Other: - twice a week - wound clinic to change 1x a week on Monday, home health to change 1x a week on Thursday or Friday Wound #10 Right Forearm  Change dressing every day. Skin Barriers/Peri-Wound Care Wound #3 Right,Posterior Lower Leg Barrier cream Moisturizing lotion - to leg Wound Cleansing Clean wound with Normal Saline. - or normal saline on days that dressing is changed May shower with protection. Primary Wound Dressing Wound #3 Right,Posterior Lower Leg Hydrofera Blue - Classic Wound #5 Right T Fifth oe Silver Collagen - moisten with hydrogel or KY jelly Wound #10 Right Forearm Xeroform Secondary Dressing Wound #3 Right,Posterior Lower Leg Foam - foam top bend of foot for protection ABD pad Zetuvit or Kerramax - or Xtrasorb Wound #5 Right T Fifth oe Kerlix/Rolled Gauze Dry Gauze Other: - felt or rolled up gauze under toe to elevate tip of toe off of shoe Wound #10 Right Forearm Kerlix/Rolled Gauze Dry Gauze Edema Control Unna Boot to Right Lower Extremity - *****ONLY ONE SINGLE LAYER OF UNNA BOOT***** - foam to achilles to pad Avoid standing for long periods of time Elevate legs to the level of the heart or above for 30 minutes daily and/or when sitting, a frequency of: - throughout the day Exercise regularly Additional Orders / Instructions Other: - Go to ER if toe/leg gets more red/black, increased pain, fever [facility]. - [facility] Electronic Signature(s) Signed: 01/29/2020 8:11:21 AM By:  [name] MD Signed: 01/29/2020 5:17:28 PM By: [name] RN, BSN Entered By: [name] on 01/28/2020 08:45:39 -------------------------------------------------------------------------------- Problem List Details Patient Name: Date of Service: [name], [name] 01/28/2020 8:15 [name] Medical Record Number: [id number] Patient Account Number: [id number] Date of Birth/Sex: Treating RN: [birth year] (74 y.o. [name] Primary Care Provider: [name] Other Clinician: Referring Provider: Treating Provider/Extender: [name] in Treatment: 25 Active Problems ICD-10 Encounter Code Description Active Date MDM Diagnosis S80.11XD Contusion of right lower leg, subsequent encounter 08/06/2019 No Yes L97.212 Non-pressure chronic ulcer of right calf with fat layer exposed 08/06/2019 No Yes I87.321 Chronic venous hypertension (idiopathic) with inflammation of right lower 08/06/2019 No Yes extremity E11.622 Type 2 diabetes mellitus with other skin ulcer 08/06/2019 No Yes L97.518 Non-pressure chronic ulcer of other part of right foot with other specified 10/01/2019 No Yes severity S41.111D Laceration without foreign body of right upper arm, subsequent encounter 01/28/2020 No Yes Inactive Problems ICD-10 Code Description Active Date Inactive Date L97.811 Non-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 12/10/2019 12/10/2019 Resolved Problems Electronic Signature(s) Signed: 01/29/2020 8:11:21 AM By: [name] MD Entered By: [name] on 01/28/2020 08:47:37 -------------------------------------------------------------------------------- Progress Note Details Patient Name: Date of Service: [name], [name] 01/28/2020 8:15 [name] Medical Record Number: [id number] Patient Account Number: [id number] Date of Birth/Sex: Treating RN: 1945-10-30 (74 y.o. [name] Primary Care Provider:  [name] Other Clinician: Referring Provider: Treating Provider/Extender: [name] in Treatment: 25 Subjective History of Present Illness (HPI) [name] HPI Description: 74 year old gentleman who was recently seen by his nephrologist [name], and noted to have a wound on his left lower extremity which was lacerated 2 months ago and now has reopened. The patient's left shin has a ulceration with some exudate but no evidence of infection and he was referred to [place] for further care as it was known that the patient has had some peripheral vascular disease in the past. Past medical history significant for chronic kidney disease, atrial fibrillation, diabetes mellitus,status post kidney transplant in 1983 and 2005, a week  fistula graft placement, status post previous bowel surgery. 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 10/06/2015 -- x-ray of the left tibia and fibula shows no evidence of osteomyelitis. The patient has also had Doppler studies of his extremity and is awaiting the appointment with the vascular surgeon. We have not yet received these reports. 10/13/2015 -- lower extremity venous duplex reflux evaluation shows reflux in the left common femoral vein, left saphenofemoral junction and the proximal greater saphenous vein extending to the proximal calf. There is also reflux in the left proximal to mid small saphenous vein. Arterial duplex studies done showed the resting ABI was not applicable due to tibial artery medial calcification. 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 The right TBI was 1 which is normal and the left ABI was 1 which is normal. Patient has otherwise been doing fine and has been compliant with his dressings. 10/20/2015 -- He was seen by [name]  recently for a vascular opinion on 10/15/2015. His left lower extremity venous insufficiency duplex study revealed GSV reflux,SS vein reflux and deep venous reflux in the common femoral vein. His ABIs were non compressible and his TBI on the right was 1.01 and on the left was 0.80. He was asked to continue with the wound care with compressive therapy followed by EVLA of the left GS vein 3 months. He recommended 20-30 mm thigh- high compression stockings and the need for a three-month trial of this. The patient had an Unna boot applied at the vascular office but he could not tolerate this with a lot of pain and issues with his toes and hence came here on Friday for removal of this and we reapplied a 2 layer compression. 11/10/2015 -- patient still has not purchased his 20-30 mm thigh-high compression stockings as prescribed by [name]. Readmission: 08/08/18 on evaluation today patient presents for readmission concerning a new injury to the left anterior lower extremity. He was previously seen in 2017 here in our clinic. He states that he has done fairly well since that point. Nonetheless he is having at this time some pain but states that he hit this on a table that fell over and actually struck his leg. This appears to have pulled back some of his skin which folded in on itself and is causing some difficulty as far as that is concerned. There does not appear to be any evidence of infection at this time. No fevers, chills, nausea, or vomiting noted at this time. He's been using dressings on his own currently without complication. 08/15/18 on evaluation today patient actually appears to be doing somewhat better in regard to his wound of the lower [name] when compared to the first visit last week. I had to do a much more extensive debridement at that time it does appear that I'[name] gonna have to perform some debridement today but it does not look to be as extensive by any means. Nonetheless fortunately  he does not show any signs of infection he does have discomfort at this site. I believe based on what I'[name] seeing currently he may benefit from Iodoflex to help keep the wound bed clean. Patient tolerated therapy without complication. Upon evaluation today the patient actually appears to be doing excellent in regard to his left lower extremity ulcer. This is much better than the previous two  visits where he had a lot of necrotic tissue around the edge of the wound simply due to the fact that again there was a significant skin tear where the edge had been cleared away prior to reattaching and being able to heal appropriately. He seems to be doing much better at this point. 08/28/18 on evaluation today the patient's wound actually does appear to be showing signs of improvement. With that being said though he is improving he would likely note even greater improvement if we were able to sharply debride the wound. Nonetheless this caused him to much discomfort he tells me. 09/04/18 on evaluation today patient actually appears to be showing signs of improvement in regard to his left lower extremity ulcer. He has been tolerating the dressing changes including the wrap although he tells me at this point that the burning does last for a couple of days even with just the Iodoflex. I was afraid that this may been part of the issue that he was having with discomfort. It does seem to be the case. Nonetheless he shows no signs of evidence of infection at this time which is good news. No fevers chills noted ADMISSION to [name] 10/05/2018 This is a patient who was cared for in 2017 and again in the fall of this year at our sister clinic and [facility]. He actually lives in [place] in [place]. We have been dealing with an apparent traumatic area on the left anterior tibial area. This is been present for the last several months. He was supposed to be using Iodoflex Kerlix and Coban however he was  hospitalized from 09/05/2018 through 09/11/2018 with delirium secondary to pneumonia. Since then he is only been putting Vaseline gauze on this without compression. He also has a more recent skin tear on the dorsal right hand that may have only happened in the last week. The patient had arterial studies done in 2017 in January which was 3 years ago. At that point he had noncompressible ABIs but really quite good TBI's both normal. Triphasic waveforms on the right monophasic at the left posterior tibial but triphasic at the left dorsalis pedis. His ABIs in our clinic today were both noncompressible 1/23; the patient has wounds on the right dorsal hand just distal to the wrist and on the left anterior lower extremity. Both of these look very healthy he is using Hydrofera Blue 1/30; left anterior lower extremity wound much smaller. Healthy looking surface. The laceration area just distal to the wrist on the dorsal hand on the right is also just about closed I used Hydrofera Blue here 2/6; left anterior lower extremity wound is much smaller but still open. The laceration area just distal to the wrist on the dorsal hand is fully epithelialized. 2/13; the patient's anterior lower extremity wound is closed. The laceration just distal to the wrist on the dorsal hand is also fully epithelialized and closed. The patient has external compression stockings which I think are 20/30 READMISSION 08/06/2019 [name] is a 74 year old man with had several times previously in our clinic. He is a diabetic with a history of chronic renal insufficiency status post kidney transplant in 1983 and again in 2005. He was then in 2017 with a laceration on the left lower extremity. He was worked up at the time with arterial studies and reflux studies. The arterial studies showed ABIs to be noncompressible but TBI's were within normal limits. I do not have the reflux studies at the moment. He  was also sent here in 2019 with a  left lower extremity wound and then again in 2020 with left lower extremity trauma a skin tear on the wrist. He was discharged with 20/30 stockings identified from myself that that might not be enough compression. Nevertheless he states he was wearing these fairly reliably. In September he had a fall with a substantial bruise in the area of the wound. He says he saw orthopedics and they told him there was some muscle strain sometime it later this opened into a wound. He has a fairly substantial wound on the right posterior calf. Satellite areas around this including medially and posteriorly. He has not worn his stockings since the injury Past medical history; includes chronic renal failure secondary to diabetes with kidney transplant x2, atrial fibrillation, heart failure with preserved ejection fraction, coronary artery disease. ABIs on the right in our clinic were once again noncompressible 08/13/2019 on evaluation today patient appears to be doing decently well with regard to his wound compared to last week's evaluation. Unfortunately he is still having a lot of discomfort at this point which is I think in some part due to the 3 layer compression wrap which is a little bit stronger I think for him. When he was here before we actually utilized a Kerlix and Coban wrap which he states seemed to got a little bit better. Nonetheless I think we can probably drop back to this in light of the discomfort that he had. Nonetheless the pain was not really right around the wound itself as much as it was around the ankle in particular. The Iodoflex does seem to have done well for him As the wound is appearing somewhat better today which is excellent news. 11/30; still complaining of a lot of pain. Apparently arterial studies I ordered 2 weeks ago are below. I do not believe we have an appointment with vein and vascular as of yet; ABI Findings: +---------+------------------+-----+----------+--------+ Right Rt  Pressure (mmHg)IndexWaveform Comment  +---------+------------------+-----+----------+--------+ PTA >254 1.50 monophasic  +---------+------------------+-----+----------+--------+ DP >254 1.50 monophasic  +---------+------------------+-----+----------+--------+ Great T oe68 0.40 Abnormal   +---------+------------------+-----+----------+--------+ +---------+------------------+-----+----------+-------+ Left Lt Pressure (mmHg)IndexWaveform Comment +---------+------------------+-----+----------+-------+ Brachial 169     +---------+------------------+-----+----------+-------+ PTA >254 1.50 monophasic  +---------+------------------+-----+----------+-------+ DP >254 1.50 monophasic  +---------+------------------+-----+----------+-------+ Great T oe40 0.24 Abnormal   +---------+------------------+-----+----------+-------+ Pedal arteries appear hyperemic. Patient refused Brachial pressure in the right arm. Summary: Right: Resting right ankle-brachial index indicates noncompressible right lower extremity arteries. The right toe-brachial index is abnormal. Left: Resting left ankle-brachial index indicates noncompressible left lower extremity arteries. The left toe-brachial index is abnormal. He is also having considerably more swelling in his left calf. This was not there when I last saw him 2 weeks ago. He tells me that some of the home health compression wraps have been slipping down and that may be the issue here however a month I am uncertain 62/9; sees vascular on December 22. Still complaining of a lot of pain. DVT study I did last time was negative for DVT 12/14; still complaining of pain which if this is arterial is certainly claudication and rest keeps him uncomfortable at night. He has an appointment with vein and vascular on December 22. Wound surface is better using Iodoflex. Once the surface of this is satisfactory and we have exhausted  the vascular route. Perhaps an advanced treatment option. He has a configuration of the venous ulceration although his arterial studies are not very good. The other issue is the patient has a transplanted kidney. This  will make angiography difficult and challenging issue 12/21; still complaining of pain and drainage. We are using Iodoflex on the wound under compression. He sees Dr. Donnetta Hutching tomorrow to evaluate his noninvasive studies noted above. He has a transplanted kidney further complicating the options for angiography. 12/28; still using Iodoflex under compression. I have Dr. Luther Parody note from 12/22. he noted arterial studies revealing monophasic waveforms at the pedal vessels bilaterally and calcified vessels making the ABI unreliable. He did not comment on the reduced TBI's. He felt these were venous wounds based on the palpable dorsalis pedis pulse. 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 And they arranged for formal venous duplex with reflux studies in the next several weeks. Follow-up with either Scot Dock or Dr. Oneida Alar 09/24/2019 still using Iodoflex under compression. He has an appointment with Dr. Doren Custard on 1/7 with regards to his venous disease. The patient was not felt to have a primary arterial problem for the nonhealing of his wound. We did attempt to wrap him with 3 layer when he first came into the clinic he complained of a lot of pain in the ankle although this may have been because the dressing fell down somewhat. He has far too edema fluid in the right leg for a good prognosis about healing this wound He comes in today with an excoriation on the bottom part of his right fifth toe. He thinks he may have done this taking off his clothes and that something got caught on the toe. There is no open wound however the toe itself is very painful 1/11; we are using Iodoflex under compression. The wound bed is clean. He went on to see Dr. Doren Custard on 09/27/2019 he again feels that the patient's  arterial supply is adequate. He feels that he might benefit from right greater saphenous vein ablation for a venous ulcer. In the meantime the area that he was complaining about last week on the right fifth toe. An x-ray that I ordered showed marked soft tissue swelling along the distal aspect of the right foot but there was no evidence of osteomyelitis. He comes in today with the fifth toenail just about coming off. He has black eschar underneath the toe on the plantar aspect. The toe was swollen red and very painful. In the right setting this could be a significant soft tissue infection versus ischemia of the toe itself. He did not show this to Dr. Doren Custard 1/18; I am using Iodoflex under compression a large wound on the right posterior calf. He had his right greater saphenous vein ablation by Dr. Doren Custard although I am not sure that is the only problem here. ooThe right fifth toe which was possibly trauma 2 weeks ago continues to be exceptionally painful with a necrotic tip. Maybe not quite as swollen. I started him empirically on Augmentin 5/125 1 p.o. twice daily last week after discussing this with Dr. Loletha Grayer of nephrology. Perhaps somewhat better this week but not as good as I was hoping. A plain x-ray was negative. He comes in today with an area on the medial right calf that was blistered and now open. In looking at things he appears to be systemically fluid volume overloaded. He has a transplanted kidney. He states he takes his Lasix variably when he has appointments he tries not to take it the later I am really not certain if he takes this reliably. However he has far too much edema fluid in the right leg to easily heal this wound and he  appears to be developing blisters medially to form additional wounds 1/25; his CO2 angiogram was done by Dr. Doren Custard last week. This showed the proximal arteries all to be patent. On the right side the common femoral deep femoral superficial femoral popliteal anterior  tibial and peroneal arteries were patent. The posterior tibial was occluded but reconstituted distally via collaterals from the peroneal artery he was felt he should have enough blood flow to heal his wounds including the right fifth toe. The right fifth toe looks better however he is still complaining of a lot of pain. The large area which is a venous ulceration posteriorly has a better looking surface I think we can switch to Hydrofera Blue today 2/1; the patient's original wounds on the right posterior medial calf has come down in width however superior to this he has new denuded areas and I am concerned we simply do not have enough edema control. He has already undergone a right greater saphenous ablation. He had a CO2 angiogram done by Dr. Doren Custard and the comment was that we should have enough blood flow to heal the venous wound however we simply do not seem to have enough edema control with 3 layer compression. The patient is status post kidney transplant although his exact renal function is not really clear. Nor am i sure what dose of diuretic he is supposed to be on. He also has the area on the right fifth toe which was unclear etiology but I think became secondarily infected I gave him 2 weeks of antibiotics for this and this seems to have settled down he still has a black eschar over the tip of the toe. X-ray was negative for fracture. He says he has a history of gout. 2/8; the patient's wound on the right posterior calf is about the same. The superficial area medially also about the same. He got a prescription for prednisoneo Gout after he developed erythema on the dorsal aspect of his left great toe going along with the right fifth toe which has been problematic all along. He has not taken it because he is concerned about increasing CBGs. He has a transplanted kidney is already on prednisone 5 mg. He would not be a good candidate for NSAIDs. Perhaps colchicine. He is not aware of what his  uric acid level is 2/15; his right posterior calf wound seems to be coming in in terms of width. Everything here looks fairly good. No mechanical debridement we have been using Hydrofera Blue. He has had an ablation by vein and vascular. Felt to have adequate arterial supply to this area. The patient got prednisone last week from Dr. Angelique Holm of nephrology. At my urging he actually took it. The area on his left toe was a lot better. The right toe was not as painful but still erythematous with a wound at the tip. We have been using silver alginate here 2/22; right posterior calf seems to be gradually epithelializing. Still a fairly substantial wound. He still has an area on the tip of his right fifth toe which I think was gout related. This is gradually improving. We have been using Unna boots to wrap X-ray I did of the foot last time was again negative there was soft tissue irregularity about the distal fifth digit but no radiographic evidence of bone damage 3/1; right posterior calf seems to be gradually improving however there were areas of hyper granulation. We have been using Hydrofera Blue. The hyper granulation is mostly evident in the most  superior finger shape projection of the wound itself. ooThe area on the right fifth toe also was slough covered and required debridement. 3/8 continued improvement in the right posterior calf and the tip of the right fifth toe. We have been using Hydrofera Blue under compression he is changing the area to the toe 3/22; continued improvement in the right posterior calf. Unfortunately comes in today with a new skin tear in the mid anterior tibia area this probably had something to do with changing his dressing home health I called and left this a message last week. 3/29; continued improvement in the a substantial wound on the right posterior calf. The skin tear anteriorly from last week is already healed on the tip of the right fifth toe there is still a  nonviable area. 4/5; we have continued contraction of the substantial wound on the right posterior calf. We continue to have problems with the tip of the right fifth toe. We have been using Hydrofera Blue to both wound areas He is complaining about some discomfort in the Achilles area. T me he had surgery for what sounds like a torn Achilles about 10 years ago. ells 4/12; we have continued contraction of the substantial wound on the right posterior calf. ooStill an area on the tip of the right fifth toe. I changed him to silver collagen on the toe last week but I think home health continue to use Hydrofera Blue to both wound areas. 4/19; we continue to have contraction of the substantial wound on the right posterior calf however there continues to be hyper granulation. ooThe area on the tip of the fifth toe is very small still some debris on the surface we have been using silver collagen 4/26; both wounds have contracted. I was hoping the fifth toe wound would close over but with probing there is still an open small hole here. 5/3; his wound on the right posterior calf which was his major area continues to contract looks healthy we have been using Hydrofera Blue. Using silver collagen to the fifth toe, not making a lot of progress here 5/10; right posterior calf wound continues to be smaller in terms of surface area and look healthy we have been using Hydrofera Blue here. The tip of the right fifth toe is still open requiring debridement. He has a new laceration on the right forearm he says he hit this on a microwave 6 days ago Objective Constitutional Patient is hypertensive.. Pulse regular and within target range for patient.Marland Kitchen Respirations regular, non-labored and within target range.. Temperature is normal and within the target range for the patient.Marland Kitchen Appears in no distress. Vitals Time Taken: 8:13 AM, Height: 67 in, Weight: 190 lbs, BMI: 29.8, Temperature: 98.4 F, Pulse: 55 bpm,  Respiratory Rate: 18 breaths/min, Blood Pressure: 177/76 mmHg. General Notes: Wound exam ooRight fifth toe. Again debris over the surface subcutaneous material removed from the wound edge as well as callus. Hemostasis with direct pressure. ooRight posterior calf healthy looking wound reduce surface area washed off with Anasept and gauze looks very healthy. ooLaceration injury on the right dorsal forearm just above the wrist. Also debrided with wound cleanser and gauze. Integumentary (Hair, Skin) Wound #10 status is Open. Original cause of wound was Trauma. The wound is located on the Right Forearm. The wound measures 0.7cm length x 3cm width x 0.1cm depth; 1.649cm^2 area and 0.165cm^3 volume. There is Fat Layer (Subcutaneous Tissue) Exposed exposed. There is no tunneling or undermining noted. There is a small amount  of serosanguineous drainage noted. There is large (67-100%) red, pink granulation within the wound bed. There is no necrotic tissue within the wound bed. Wound #3 status is Open. Original cause of wound was Trauma. The wound is located on the Right,Posterior Lower Leg. The wound measures 5.4cm length x 2.3cm width x 0.1cm depth; 9.755cm^2 area and 0.975cm^3 volume. There is Fat Layer (Subcutaneous Tissue) Exposed exposed. There is no tunneling or undermining noted. There is a medium amount of serosanguineous drainage noted. The wound margin is flat and intact. There is large (67-100%) red granulation within the wound bed. There is a small (1-33%) amount of necrotic tissue within the wound bed including Adherent Slough. Wound #5 status is Open. Original cause of wound was Blister. The wound is located on the Right T Fifth. The wound measures 0.2cm length x 0.2cm width x oe 0.1cm depth; 0.031cm^2 area and 0.003cm^3 volume. There is no tunneling or undermining noted. There is a small amount of serous drainage noted. The wound margin is flat and intact. There is medium (34-66%) pink  granulation within the wound bed. There is a medium (34-66%) amount of necrotic tissue within the wound bed including Eschar. 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 There was a Excisional Skin/Subcutaneous Tissue Debridement with a total area of 0.04 sq cm performed by Ricard Dillon., MD. With the following instrument(s): Curette to remove Viable and Non-Viable tissue/material. Material removed includes Callus and Subcutaneous Tissue and. No specimens were taken. A time out was conducted at 08:42, prior to the start of the procedure. A Minimum amount of bleeding was controlled with Pressure. The procedure was tolerated well with a pain level of 0 throughout and a pain level of 0 following the procedure. Post Debridement Measurements: 0.2cm length x 0.2cm width x 0.1cm depth; 0.003cm^3 volume. Character of Wound/Ulcer Post Debridement is improved. Severity of Tissue Post Debridement is: Fat layer exposed. Post procedure Diagnosis Wound #5: Same as Pre-Procedure Wound #3 Pre-procedure diagnosis of Wound #3 is a Venous Leg Ulcer located on the Right,Posterior Lower Leg . There was a Haematologist Compression Therapy Procedure by [name], RN. Post procedure Diagnosis Wound #3: Same as Pre-Procedure Plan Follow-up Appointments: Return Appointment in 1 week. Dressing Change Frequency: Wound #3  Right,Posterior Lower Leg: Other: - twice a week - wound clinic to change 1x a week on Monday, home health to change 1x a week on Thursday or Friday Wound #5 Right T Fifth: oe Other: - twice a week - wound clinic to change 1x a week on Monday, home health to change 1x a week on Thursday or Friday Wound #10 Right Forearm: Change dressing every day. Skin Barriers/Peri-Wound Care: Wound #3 Right,Posterior Lower Leg: Barrier cream Moisturizing lotion - to leg Wound Cleansing: Clean wound with Normal Saline. - or normal saline on days that dressing is changed May shower with protection. Primary Wound Dressing: Wound #3 Right,Posterior Lower Leg: Hydrofera Blue - Classic Wound #5 Right T Fifth: oe Silver Collagen -  moisten with hydrogel or KY jelly Wound #10 Right Forearm: Xeroform Secondary Dressing: Wound #3 Right,Posterior Lower Leg: Foam - foam top bend of foot for protection ABD pad Zetuvit or Kerramax - or Xtrasorb Wound #5 Right T Fifth: oe Kerlix/Rolled Gauze Dry Gauze Other: - felt or rolled up gauze under toe to elevate tip of toe off of shoe Wound #10 Right Forearm: Kerlix/Rolled Gauze Dry Gauze Edema Control: Unna Boot to Right Lower Extremity - *****ONLY ONE SINGLE LAYER OF UNNA BOOT***** - foam to achilles to pad Avoid standing for long periods of time Elevate legs to the level of the heart or above for 30 minutes daily and/or when sitting, a frequency of: - throughout the day Exercise regularly Additional Orders / Instructions: Other: - Go to ER if toe/leg gets more red/black, increased pain, fever Home Health: [facility] for wound care. - [facility] 1. Continue with silver collagen to the right fifth toe. This should heal however I think this is simply a matter of a hammertoe on the sole of his shoe. He is using a surgical sandal 2. Continue with Hydrofera Blue on the right posterior lower leg were making good progress here 3. Xeroform  and gauze to the skin tear on the right dorsal forearm. This cleaned up quite nicely I do not think this should be too much of a problem however he is on chronic prednisone Electronic Signature(s) Signed: 01/29/2020 8:11:21 AM By: [name] MD Entered By: [name] on 01/28/2020 08:52:10 -------------------------------------------------------------------------------- SuperBill Details Patient Name: Date of Service: YOSGART, PAVEY HA [name] 01/28/2020 Medical Record Number: [id number] Patient Account Number: [id number] Date of Birth/Sex: Treating RN: 1946/05/07 (74 y.o. [name] Primary Care Provider: [name] Other Clinician: Referring Provider: Treating Provider/Extender: [name] in Treatment: 25 Diagnosis Coding ICD-10 Codes Code Description S80.11XD Contusion of right lower leg, subsequent encounter L97.212 Non-pressure chronic ulcer of right calf with fat layer exposed I87.321 Chronic venous hypertension (idiopathic) with inflammation of right lower extremity E11.622 Type 2 diabetes mellitus with other skin ulcer L97.518 Non-pressure chronic ulcer of other part of right foot with other specified severity S41.111D Laceration without foreign body of right upper arm, subsequent encounter Facility Procedures CPT4 Code: [id number] Description: 70263 - DEB SUBQ TISSUE 20 SQ CM/< ICD-10 Diagnosis Description S80.11XD Contusion of right lower leg, subsequent encounter L97.212 Non-pressure chronic ulcer of right calf with fat layer exposed Modifier: Quantity: 1 CPT4 Code: 78588502 Description: (Facility Use Only) 77412IN - APPLY UNNA BOOT RT Modifier: 10 Quantity: 1 Physician Procedures : CPT4 Code Description Modifier [id number] 11042 - WC PHYS SUBQ TISS 20 SQ CM ICD-10 Diagnosis Description S80.11XD Contusion of right lower leg, subsequent encounter L97.212 Non-pressure chronic ulcer of right calf with fat layer  exposed Quantity: 1 Electronic Signature(s) Signed: 01/29/2020 8:11:21 AM By: [name] MD Signed: 01/29/2020 5:17:28 PM By: [name] RN, BSN Entered By: [name] on 01/28/2020 08:53:18

## 2020-01-29 NOTE — Progress Notes (Signed)
[name], [name] ([id number]) Visit Report for 01/28/2020 Arrival Information Details Patient Name: Date of Service: [name], [name] 01/28/2020 8:15 [name] Medical Record Number: [id number] Patient Account Number: [id number] Date of Birth/Sex: Treating RN: 1945-11-14 (74 y.o. [name] Primary Care Doshia Dalia: [name] Other Clinician: Referring Almetta Liddicoat: Treating Zainah Steven/Extender: [name] in Treatment: 25 Visit Information History Since Last Visit Added or deleted any medications: No Patient Arrived: Ambulatory Any new allergies or adverse reactions: No Arrival Time: 08:13 Had a fall or experienced change in No Accompanied By: self activities of daily living that may affect Transfer Assistance: None risk of falls: Patient Identification Verified: Yes Signs or symptoms of abuse/neglect since last visito No Secondary Verification Process Completed: Yes Hospitalized since last visit: No Patient Requires Transmission-Based Precautions: No Implantable device outside of the clinic excluding No Patient Has Alerts: Yes cellular tissue based products placed in the center Patient Alerts: non compressable since last visit: Has Dressing in Place as Prescribed: Yes Pain Present Now: No Electronic Signature(s) Signed: 01/29/2020 8:52:48 AM By: [name] Entered By: [name] on 01/28/2020 08:13:54 -------------------------------------------------------------------------------- Compression Therapy Details Patient Name: Date of Service: [name], [name] 01/28/2020 8:15 [name] Medical Record Number: [id number] Patient Account Number: [id number] Date of Birth/Sex: Treating RN: 31-Jan-1946 (74 y.o. [name] Primary Care Nimisha Rathel: [name] Other Clinician: Referring Neilah Fulwider: Treating Tallis Soledad/Extender: [name] in Treatment: 25 Compression Therapy Performed for Wound  Assessment: Wound #3 Right,Posterior Lower Leg Performed By: Clinician [name], RN Compression Type: Rolena Infante Post Procedure Diagnosis Same as Pre-procedure Electronic Signature(s) Signed: 01/29/2020 5:17:28 PM By: [name] RN, BSN Entered By: [name] on 01/28/2020 08:43:36 -------------------------------------------------------------------------------- Encounter Discharge Information Details Patient Name: Date of Service: [name], [name] 01/28/2020 8:15 [name] Medical Record Number: [id number] Patient Account Number: [id number] Date of Birth/Sex: Treating RN: August 21, 1946 (74 y.o. [name] Primary Care Emireth Cockerham: [name] Other Clinician: Referring Ryann Pauli: Treating Jorene [name]/Extender: [name] in Treatment: 25 Encounter Discharge Information Items Post Procedure Vitals Discharge Condition: Stable Temperature (F): 98.4 Ambulatory Status: Ambulatory Pulse (bpm): 55 Discharge Destination: Home Respiratory Rate (breaths/[name]): 18 Transportation: Private Auto Blood Pressure (mmHg): 177/76 Accompanied By: self Schedule Follow-up Appointment: Yes Clinical Summary of Care: Electronic Signature(s) Signed: 01/28/2020 5:18:32 PM By: [name] Entered By: [name] on 01/28/2020 09:03:00 -------------------------------------------------------------------------------- Lower Extremity Assessment Details Patient Name: Date of Service: [name], [name] 01/28/2020 8:15 [name] Medical Record Number: [id number] Patient Account Number: [id number] Date of Birth/Sex: Treating RN: 1946-01-14 (73 y.o. [name]) Carlene Coria Primary Care [name]: [name] Other Clinician: Referring Maybel Dambrosio: Treating Amoni Morales/Extender: [name] in Treatment: 25 Edema Assessment Assessed: Shirlyn Goltz: No] [Right: No] Edema: [Left: Ye] [Right: s] Calf Left: Right: Point of Measurement: 30 cm  From Medial Instep cm 34 cm Ankle Left: Right: Point of Measurement: 10 cm From Medial Instep cm 22 cm Electronic Signature(s) Signed: 01/29/2020 5:53:12 PM By: Carlene Coria RN Entered By: Carlene Coria on 01/28/2020 08:21:21 -------------------------------------------------------------------------------- Multi Wound Chart Details Patient Name: Date of Service: [name], [name] 01/28/2020 8:15 [name] Medical Record Number: [id number] Patient Account Number: [id number] Date of Birth/Sex: Treating RN: [birth year] (74 y.o. [name] Primary Care Keanthony Poole: [name] Other Clinician: Referring Shawntay Prest: Treating Kaeden Mester/Extender: [name] in Treatment: 25 Vital Signs Height(in): 67 Pulse(bpm): 55  Weight(lbs): 190 Blood Pressure(mmHg): 177/76 Body Mass Index(BMI): 30 Temperature(F): 98.4 Respiratory Rate(breaths/[name]): 18 Photos: [10:No Photos Right Forearm] [3:No Photos Right, Posterior Lower Leg] [5:No Photos Right T Fifth oe] Wound Location: [10:Trauma] [3:Trauma] [5:Blister] Wounding Event: [10:Skin Tear] [3:Venous Leg Ulcer] [5:Diabetic Wound/Ulcer of the Lower] Primary Etiology: [10:Cataracts, Anemia, Arrhythmia,] [3:Cataracts, Anemia, Arrhythmia,] [5:Extremity Cataracts, Anemia, Arrhythmia,] Comorbid History: [10:Congestive Heart Failure, Hypertension, Type II Diabetes, Gout, Hypertension, Type II Diabetes, Gout, Hypertension, Type II Diabetes, Gout, Neuropathy 01/15/2020] [3:Congestive Heart Failure, Neuropathy 06/06/2019] [5:Congestive Heart  Failure, Neuropathy 09/19/2019] Date Acquired: [10:0] [3:25] [5:18] Weeks of Treatment: [10:Open] [3:Open] [5:Open] Wound Status: [10:0.7x3x0.1] [3:5.4x2.3x0.1] [5:0.2x0.2x0.1] Measurements L x W x D (cm) [10:1.649] [3:9.755] [5:0.031] A (cm) : rea [10:0.165] [3:0.975] [5:0.003] Volume (cm) : [10:N/A] [3:56.70%] [5:94.70%] % Reduction in A [10:rea: N/A] [3:85.60%] [5:94.90%] %  Reduction in Volume: [10:Full Thickness Without Exposed] [3:Full Thickness Without Exposed] [5:Grade 2] Classification: [10:Support Structures Small] [3:Support Structures Medium] [5:Small] Exudate A mount: [10:Serosanguineous] [3:Serosanguineous] [5:Serous] Exudate Type: [10:red, brown] [3:red, brown] [5:amber] Exudate Color: [10:N/A] [3:Flat and Intact] [5:Flat and Intact] Wound Margin: [10:Large (67-100%)] [3:Large (67-100%)] [5:Medium (34-66%)] Granulation A mount: [10:Red, Pink] [3:Red] [5:Pink] Granulation Quality: [10:None Present (0%)] [3:Small (1-33%)] [5:Medium (34-66%)] Necrotic A mount: [10:N/A] [3:Adherent Slough] [5:Eschar] Necrotic Tissue: [10:Fat Layer (Subcutaneous Tissue)] [3:Fat Layer (Subcutaneous Tissue)] [5:Fascia: No] Exposed Structures: [10:Exposed: Yes Fascia: No Tendon: No Muscle: No Joint: No Bone: No None] [3:Exposed: Yes Fascia: No Tendon: No Muscle: No Joint: No Bone: No Medium (34-66%)] [5:Fat Layer (Subcutaneous Tissue) Exposed: No Tendon: No Muscle: No Joint: No Bone: No Medium  (34-66%)] Epithelialization: [10:N/A] [3:N/A] [5:Debridement - Excisional] Debridement: Pre-procedure Verification/Time Out N/A [3:N/A] [5:08:42] Taken: [10:N/A] [3:N/A] [5:Callus, Subcutaneous] Tissue Debrided: [10:N/A] [3:N/A] [5:Skin/Subcutaneous Tissue] Level: [10:N/A] [3:N/A] [5:0.04] Debridement A (sq cm): [10:rea N/A] [3:N/A] [5:Curette] Instrument: [10:N/A] [3:N/A] [5:Minimum] Bleeding: [10:N/A] [3:N/A] [5:Pressure] Hemostasis A chieved: [10:N/A] [3:N/A] [5:0] Procedural Pain: [10:N/A] [3:N/A] [5:0] Post Procedural Pain: [10:N/A] [3:N/A] [5:Procedure was tolerated well] Debridement Treatment Response: [10:N/A] [3:N/A] [5:0.2x0.2x0.1] Post Debridement Measurements L x W x D (cm) [10:N/A] [3:N/A] [5:0.003] Post Debridement Volume: (cm) [10:N/A] [3:Compression Therapy] [5:Debridement] Treatment Notes Electronic Signature(s) Signed: 01/29/2020 8:11:21 AM By: [name]  MD Signed: 01/29/2020 5:17:28 PM By: [name] RN, BSN Entered By: [name] on 01/28/2020 08:47:44 -------------------------------------------------------------------------------- Multi-Disciplinary Care Plan Details Patient Name: Date of Service: [name], [name] 01/28/2020 8:15 [name] Medical Record Number: [id number] Patient Account Number: [id number] Date of Birth/Sex: Treating RN: [birth year] (74 y.o. [name] Primary Care Braelin Costlow: [name] Other Clinician: Referring Tyrihanna Wingert: Treating Ala Kratz/Extender: [name] in Treatment: 25 Active Inactive Wound/Skin Impairment Nursing Diagnoses: Impaired tissue integrity Goals: Patient/caregiver will verbalize understanding of skin care regimen Date Initiated: 08/06/2019 Target Resolution Date: 02/22/2020 Goal Status: Active Ulcer/skin breakdown will have a volume reduction of 30% by week 4 Date Initiated: 08/06/2019 Date Inactivated: 09/03/2019 Target Resolution Date: 09/07/2019 Goal Status: Unmet Unmet Reason: PAD, necrotic surface Interventions: Assess patient/caregiver ability to obtain necessary supplies Assess patient/caregiver ability to perform ulcer/skin care regimen upon admission and as needed Assess ulceration(s) every visit Provide education on ulcer and skin care Notes: Electronic Signature(s) Signed: 01/29/2020 5:17:28 PM By: [name] RN, BSN Entered By: [name] on 01/28/2020 08:30:36 -------------------------------------------------------------------------------- Pain Assessment Details Patient Name: Date of Service: [name], [name] 01/28/2020 8:15 [name] Medical Record Number: [id number] Patient Account Number: [id number] Date of Birth/Sex: Treating RN: 21-Oct-1945 (74 y.o. [name] Primary Care  Zamoria Boss: [name] Other Clinician: Referring Charlyn Vialpando: Treating Jaece Ducharme/Extender: [name] in Treatment: 25 Active Problems Location of Pain Severity and Description of Pain Patient Has Paino No Site Locations Pain Management and Medication Current Pain Management: Electronic Signature(s) Signed: 01/29/2020 8:52:48 AM By: [name] Signed: 01/29/2020 5:17:28 PM By: [name] RN, BSN Entered By: [name] on 01/28/2020 08:14:25 -------------------------------------------------------------------------------- Patient/Caregiver Education Details Patient Name: Date of Service: [name] 5/10/2021andnbsp8:15 [name] Medical Record Number: [id number] Patient Account Number: [id number] Date of Birth/Gender: Treating RN: 1946/07/05 (74 y.o. [name] Primary Care Physician: [name] Other Clinician: Referring Physician: Treating Physician/Extender: [name] in Treatment: 25 Education Assessment Education Provided To: Patient Education Topics Provided Wound/Skin Impairment: Methods: Explain/Verbal Responses: State content correctly [employer]) Signed: 01/29/2020 5:17:28 PM By: [name] RN, BSN Entered By: [name] on 01/28/2020 08:30:46 -------------------------------------------------------------------------------- Wound Assessment Details Patient Name: Date of Service: [name], [name] 01/28/2020 8:15 [name] Medical Record Number: [id number] Patient Account Number: [id number] Date of Birth/Sex: Treating RN: 1946/07/24 (73 y.o. [name]) Carlene Coria Primary Care Daisi Kentner: [name] Other Clinician: Referring Corayma Cashatt: Treating Aniyha Tate/Extender: [name] in Treatment: 25 Wound Status Wound Number: 10 Primary Skin Tear Etiology: Wound Location: Right Forearm Wound Open Wounding Event: Trauma Status: Date Acquired: 01/15/2020 Comorbid Cataracts, Anemia, Arrhythmia, Congestive Heart Failure, Weeks Of  Treatment: 0 History: Hypertension, Type II Diabetes, Gout, Neuropathy Clustered Wound: No Photos Photo Uploaded By: Mikeal Hawthorne on 01/29/2020 08:23:30 Wound Measurements Length: (cm) 0.7 Width: (cm) 3 Depth: (cm) 0.1 Area: (cm) 1.649 Volume: (cm) 0.165 % Reduction in Area: % Reduction in Volume: Epithelialization: None Tunneling: No Undermining: No Wound Description Classification: Full Thickness Without Exposed Support Struc Exudate Amount: Small Exudate Type: Serosanguineous Exudate Color: red, brown tures Foul Odor After Cleansing: No Slough/Fibrino No Wound Bed Granulation Amount: Large (67-100%) Exposed Structure Granulation Quality: Red, Pink Fascia Exposed: No Necrotic Amount: None Present (0%) Fat Layer (Subcutaneous Tissue) Exposed: Yes Tendon Exposed: No Muscle Exposed: No Joint Exposed: No Bone Exposed: No Treatment Notes Wound #10 (Right Forearm) 1. Cleanse With Wound Cleanser 3. Primary Dressing Applied Xeroform Gauze 4. Secondary Dressing Dry Gauze Roll Gauze 5. Secured With Medipore tape Notes netting. explained how to care for dressing and wound. patient in agreement. Electronic Signature(s) Signed: 01/29/2020 5:53:12 PM By: Carlene Coria RN Entered By: Carlene Coria on 01/28/2020 08:25:55 -------------------------------------------------------------------------------- Wound Assessment Details Patient Name: Date of Service: [name], [name] 01/28/2020 8:15 [name] Medical Record Number: [id number] Patient Account Number: [id number] Date of Birth/Sex: Treating RN: Aug 15, 1946 (73 y.o. [name]) Carlene Coria Primary Care Mikael Skoda: [name] Other Clinician: Referring Arkel Cartwright: Treating Ivonna Kinnick/Extender: [name] in Treatment: 25 Wound Status Wound Number: 3 Primary Venous Leg Ulcer Etiology: Wound Location: Right, Posterior Lower Leg Wound Open Wounding Event: Trauma Status: Date Acquired:  06/06/2019 Comorbid Cataracts, Anemia, Arrhythmia, Congestive Heart Failure, Weeks Of Treatment: 25 History: Hypertension, Type II Diabetes, Gout, Neuropathy Clustered Wound: No Photos Photo Uploaded By: Mikeal Hawthorne on 01/29/2020 08:26:50 Wound Measurements Length: (cm) 5.4 Width: (cm) 2.3 Depth: (cm) 0.1 Area: (cm) 9.755 Volume: (cm) 0.975 % Reduction in Area: 56.7% % Reduction in Volume: 85.6% Epithelialization: Medium (34-66%) Tunneling: No Undermining: No Wound Description Classification: Full Thickness Without Exposed Support Structures Wound Margin: Flat and Intact Exudate Amount: Medium Exudate Type: Serosanguineous Exudate Color: red, brown Foul Odor  After Cleansing: No Slough/Fibrino Yes Wound Bed Granulation Amount: Large (67-100%) Exposed Structure Granulation Quality: Red Fascia Exposed: No Necrotic Amount: Small (1-33%) Fat Layer (Subcutaneous Tissue) Exposed: Yes Necrotic Quality: Adherent Slough Tendon Exposed: No Muscle Exposed: No Joint Exposed: No Bone Exposed: No Treatment Notes Wound #3 (Right, Posterior Lower Leg) 1. Cleanse With Wound Cleanser Soap and water 3. Primary Dressing Applied Hydrofera Blue 4. Secondary Dressing Kerramax/Xtrasorb 6. Support Layer [employer] Notes HB classic with normal saline. foam applied for protection at achilles area per patient. Electronic Signature(s) Signed: 01/29/2020 5:53:12 PM By: Carlene Coria RN Entered By: Carlene Coria on 01/28/2020 08:26:16 -------------------------------------------------------------------------------- Wound Assessment Details Patient Name: Date of Service: [name], BADLEY HA [name] 01/28/2020 8:15 [name] Medical Record Number: [id number] Patient Account Number: [id number] Date of Birth/Sex: Treating RN: 10-08-1945 (73 y.o. [name]) Carlene Coria Primary Care Manna Gose: [name] Other Clinician: Referring Kiegan Macaraeg: Treating Haskell Rihn/Extender: [name] in Treatment: 25 Wound Status Wound Number: 5 Primary Diabetic Wound/Ulcer of the Lower Extremity Etiology: Wound Location: Right T Fifth oe Wound Open Wounding Event: Blister Status: Date Acquired: 09/19/2019 Comorbid Cataracts, Anemia, Arrhythmia, Congestive Heart Failure, Weeks Of Treatment: 18 History: Hypertension, Type II Diabetes, Gout, Neuropathy Clustered Wound: No Photos Photo Uploaded By: Mikeal Hawthorne on 01/29/2020 08:26:50 Wound Measurements Length: (cm) 0.2 Width: (cm) 0.2 Depth: (cm) 0.1 Area: (cm) 0.031 Volume: (cm) 0.003 % Reduction in Area: 94.7% % Reduction in Volume: 94.9% Epithelialization: Medium (34-66%) Tunneling: No Undermining: No Wound Description Classification: Grade 2 Wound Margin: Flat and Intact Exudate Amount: Small Exudate Type: Serous Exudate Color: amber Foul Odor After Cleansing: No Slough/Fibrino Yes Wound Bed Granulation Amount: Medium (34-66%) Exposed Structure Granulation Quality: Pink Fascia Exposed: No Necrotic Amount: Medium (34-66%) Fat Layer (Subcutaneous Tissue) Exposed: No Necrotic Quality: Eschar Tendon Exposed: No Muscle Exposed: No Joint Exposed: No Bone Exposed: No Treatment Notes Wound #5 (Right Toe Fifth) 1. Cleanse With Wound Cleanser Soap and water 3. Primary Dressing Applied [employer] 4. Secondary Dressing Dry Gauze Roll Gauze 5. Secured With Medipore tape 7. Footwear/Offloading device applied Surgical shoe Electronic Signature(s) Signed: 01/29/2020 5:53:12 PM By: Carlene Coria RN Entered By: Carlene Coria on 01/28/2020 08:23:39 -------------------------------------------------------------------------------- Vitals Details Patient Name: Date of Service: KANOA, PHILLIPPI HA [name] 01/28/2020 8:15 [name] Medical Record Number: [id number] Patient Account Number: [id number] Date of Birth/Sex: Treating RN: [birth year] (74 y.o. [name] Primary Care Jettson Crable: [name] Other Clinician: Referring Gowri Suchan: Treating Sharlett Lienemann/Extender: [name] in Treatment: 25 Vital Signs Time Taken: 08:13 Temperature (F): 98.4 Height (in): 67 Pulse (bpm): 55 Weight (lbs): 190 Respiratory Rate (breaths/[name]): 18 Body Mass Index (BMI): 29.8 Blood Pressure (mmHg): 177/76 Reference Range: 80 - 120 mg / dl Electronic Signature(s) Signed: 01/29/2020 8:52:48 AM By: [name] Entered By: [name] on 01/28/2020 08:14:15

## 2020-01-30 NOTE — Progress Notes (Signed)
[name], [name] ([id number]) Visit Report for 01/14/2020 Arrival Information Details Patient Name: Date of Service: [name], [name] 01/14/2020 10:30 A M Medical Record Number: [id number] Patient Account Number: [id number] Date of Birth/Sex: Treating RN: 1945/12/07 (74 y.o. [name] Primary Care Posey Jasmin: [name] Other Clinician: Referring Pippa Hanif: Treating Shelvie Salsberry/Extender: [name] in Treatment: 23 Visit Information History Since Last Visit Added or deleted any medications: No Patient Arrived: Ambulatory Any new allergies or adverse reactions: No Arrival Time: 10:09 Had a fall or experienced change in No Accompanied By: self activities of daily living that may affect Transfer Assistance: Manual risk of falls: Patient Identification Verified: Yes Signs or symptoms of abuse/neglect since last visito No Secondary Verification Process Completed: Yes Hospitalized since last visit: No Patient Requires Transmission-Based Precautions: No Implantable device outside of the clinic excluding No Patient Has Alerts: Yes cellular tissue based products placed in the center Patient Alerts: non compressable since last visit: Has Dressing in Place as Prescribed: Yes Pain Present Now: No Electronic Signature(s) Signed: 01/30/2020 9:09:16 AM By: [name] Entered By: [name] on 01/14/2020 10:09:34 -------------------------------------------------------------------------------- Compression Therapy Details Patient Name: Date of Service: [name], [name] HA M 01/14/2020 10:30 A M Medical Record Number: [id number] Patient Account Number: [id number] Date of Birth/Sex: Treating RN: Oct 22, 1945 (74 y.o. [name] Primary Care Brolin Dambrosia: [name] Other Clinician: Referring Kiril Hippe: Treating Samarah Hogle/Extender: [name] in Treatment: 23 Compression Therapy Performed for Wound  Assessment: Wound #3 Right,Posterior Lower Leg Performed By: Clinician [name], RN Compression Type: Rolena Infante Post Procedure Diagnosis Same as Pre-procedure Electronic Signature(s) Signed: 01/14/2020 5:33:33 PM By: [name] RN, BSN Entered By: [name] on 01/14/2020 11:07:06 -------------------------------------------------------------------------------- Encounter Discharge Information Details Patient Name: Date of Service: [name], [name] HA M 01/14/2020 10:30 A M Medical Record Number: [id number] Patient Account Number: [id number] Date of Birth/Sex: Treating RN: January 11, 1946 (74 y.o. [name] Primary Care Lauralei Clouse: [name] Other Clinician: Referring Cindy Fullman: Treating Yesena Reaves/Extender: [name] in Treatment: 23 Encounter Discharge Information Items Discharge Condition: Stable Ambulatory Status: Ambulatory Discharge Destination: Home Transportation: Private Auto Accompanied By: self Schedule Follow-up Appointment: Yes Clinical Summary of Care: Electronic Signature(s) Signed: 01/14/2020 5:14:33 PM By: [name] Entered By: [name] on 01/14/2020 11:17:46 -------------------------------------------------------------------------------- Lower Extremity Assessment Details Patient Name: Date of Service: [name], [name] 01/14/2020 10:30 A M Medical Record Number: [id number] Patient Account Number: [id number] Date of Birth/Sex: Treating RN: [birth year] (74 y.o. [name] Primary Care Beckem Tomberlin: [name] Other Clinician: Referring Amity Roes: Treating Kiril Hippe/Extender: [name] in Treatment: 23 Edema Assessment Assessed: Shirlyn Goltz: No] Patrice Paradise: No] Edema: [Left: Ye] [Right: s] Calf Left: Right: Point of Measurement: 30 cm From Medial Instep cm 34.5 cm Ankle Left: Right: Point of Measurement: 10 cm From Medial Instep cm 23.5 cm Vascular  Assessment Pulses: Dorsalis Pedis Palpable: [Right:Yes] Electronic Signature(s) Signed: 01/15/2020 5:26:58 PM By: Kela Millin Entered By: Kela Millin on 01/14/2020 10:14:17 -------------------------------------------------------------------------------- Multi Wound Chart Details Patient Name: Date of Service: [name], [name] HA M 01/14/2020 10:30 A M Medical Record Number: [id number] Patient Account Number: [id number] Date of Birth/Sex: Treating RN: 11-16-1945 (74 y.o. [name] Primary Care Hanan Mcwilliams: [name] Other Clinician: Referring Lota Leamer: Treating Jahfari Ambers/Extender: [name] in Treatment: 23 Vital Signs Height(in): 67 Pulse(bpm): 52 Weight(lbs): 190 Blood Pressure(mmHg): 147/68 Body Mass Index(BMI): 30 Temperature(F): 97.6  Respiratory Rate(breaths/min): 19 Photos: [3:No Photos Right, Posterior Lower Leg] [5:No Photos Right T Fifth oe] [N/A:N/A N/A] Wound Location: [3:Trauma] [5:Blister] [N/A:N/A] Wounding Event: [3:Venous Leg Ulcer] [5:Diabetic Wound/Ulcer of the Lower] [N/A:N/A] Primary Etiology: [3:Cataracts, Anemia, Arrhythmia,] [5:Extremity Cataracts, Anemia, Arrhythmia,] [N/A:N/A] Comorbid History: [3:Congestive Heart Failure, Hypertension, Type II Diabetes, Gout, Hypertension, Type II Diabetes, Gout, Neuropathy 06/06/2019] [5:Congestive Heart Failure, Neuropathy 09/19/2019] [N/A:N/A] Date Acquired: [3:23] [5:16] [N/A:N/A] Weeks of Treatment: [3:Open] [5:Open] [N/A:N/A] Wound Status: [3:6.4x3x0.1] [5:0.2x0.2x0.1] [N/A:N/A] Measurements L x W x D (cm) [3:15.08] [5:0.031] [N/A:N/A] A (cm) : rea [3:1.508] [5:0.003] [N/A:N/A] Volume (cm) : [3:33.10%] [5:94.70%] [N/A:N/A] % Reduction in Area: [3:77.70%] [5:94.90%] [N/A:N/A] % Reduction in Volume: [3:Full Thickness Without Exposed] [5:Grade 2] [N/A:N/A] Classification: [3:Support Structures Medium] [5:Small] [N/A:N/A] Exudate Amount:  [3:Serosanguineous] [5:Serous] [N/A:N/A] Exudate Type: [3:red, brown] [5:amber] [N/A:N/A] Exudate Color: [3:Flat and Intact] [5:Flat and Intact] [N/A:N/A] Wound Margin: [3:Large (67-100%)] [5:Medium (34-66%)] [N/A:N/A] Granulation Amount: [3:Red] [5:Pink, Pale] [N/A:N/A] Granulation Quality: [3:Small (1-33%)] [5:Medium (34-66%)] [N/A:N/A] Necrotic Amount: [3:Fat Layer (Subcutaneous Tissue)] [5:Fat Layer (Subcutaneous Tissue)] [N/A:N/A] Exposed Structures: [3:Exposed: Yes Fascia: No Tendon: No Muscle: No Joint: No Bone: No Medium (34-66%)] [5:Exposed: Yes Fascia: No Tendon: No Muscle: No Joint: No Bone: No Medium (34-66%)] [N/A:N/A] Epithelialization: [3:Compression Therapy] [5:N/A] [N/A:N/A] Treatment Notes Wound #3 (Right, Posterior Lower Leg) 1. Cleanse With Wound Cleanser Soap and water 2. Periwound Care Moisturizing lotion 3. Primary Dressing Applied Hydrofera Blue 4. Secondary Dressing ABD Pad Foam 6. Support Layer [employer] Notes HB classic with normal saline. foam applied for protection at achilles area per patient. Wound #5 (Right Toe Fifth) 1. Cleanse With Wound Cleanser Soap and water 3. Primary Dressing Applied Collegen AG Hydrogel or K-Y Jelly 4. Secondary Dressing Dry Gauze 5. Secured With Medipore tape Notes applied a rolled gauze under toe as directed. Electronic Signature(s) Signed: 01/14/2020 5:33:33 PM By: [name] RN, BSN Signed: 01/14/2020 5:40:04 PM By: [name] MD Entered By: [name] on 01/14/2020 11:55:41 -------------------------------------------------------------------------------- Multi-Disciplinary Care Plan Details Patient Name: Date of Service: [name], [name] HA M 01/14/2020 10:30 A M Medical Record Number: [id number] Patient Account Number: [id number] Date of Birth/Sex: Treating RN: [birth year] (74 y.o. [name] Primary Care Selen Smucker: [name] Other Clinician: Referring Lynnix Schoneman: Treating  Legend Pecore/Extender: [name] in Treatment: 23 Active Inactive Wound/Skin Impairment Nursing Diagnoses: Impaired tissue integrity Goals: Patient/caregiver will verbalize understanding of skin care regimen Date Initiated: 08/06/2019 Target Resolution Date: 01/25/2020 Goal Status: Active Ulcer/skin breakdown will have a volume reduction of 30% by week 4 Date Initiated: 08/06/2019 Date Inactivated: 09/03/2019 Target Resolution Date: 09/07/2019 Goal Status: Unmet Unmet Reason: PAD, necrotic surface Interventions: Assess patient/caregiver ability to obtain necessary supplies Assess patient/caregiver ability to perform ulcer/skin care regimen upon admission and as needed Assess ulceration(s) every visit Provide education on ulcer and skin care Notes: Electronic Signature(s) Signed: 01/14/2020 5:33:33 PM By: [name] RN, BSN Entered By: [name] on 01/14/2020 10:07:53 -------------------------------------------------------------------------------- Pain Assessment Details Patient Name: Date of Service: [name], [name] HA M 01/14/2020 10:30 A M Medical Record Number: [id number] Patient Account Number: [id number] Date of Birth/Sex: Treating RN: [birth year] (74 y.o. [name] Primary Care Davina Howlett: [name] Other Clinician: Referring Ladarien Beeks: Treating Jayd Cadieux/Extender: [name] in Treatment: 23 Active Problems Location of Pain Severity and Description of Pain Patient Has Paino No Site Locations Pain Management and Medication Current Pain Management: Electronic Signature(s) Signed: 01/15/2020 5:26:58 PM By: Kela Millin Entered  By: Kela Millin on 01/14/2020 10:12:14 -------------------------------------------------------------------------------- Patient/Caregiver Education Details Patient Name: Date of Service: KASPER, MUDRICK HA M 4/26/2021andnbsp10:30 A M Medical  Record Number: [id number] Patient Account Number: [id number] Date of Birth/Gender: Treating RN: April 23, 1946 (74 y.o. [name] Primary Care Physician: [name] Other Clinician: Referring Physician: Treating Physician/Extender: [name] in Treatment: 23 Education Assessment Education Provided To: Patient Education Topics Provided Wound/Skin Impairment: Methods: Explain/Verbal Responses: State content correctly [employer]) Signed: 01/14/2020 5:33:33 PM By: [name] RN, BSN Entered By: [name] on 01/14/2020 10:08:05 -------------------------------------------------------------------------------- Wound Assessment Details Patient Name: Date of Service: [name], [name] HA M 01/14/2020 10:30 A M Medical Record Number: [id number] Patient Account Number: [id number] Date of Birth/Sex: Treating RN: [birth year] (74 y.o. [name] Primary Care Kaleigha Chamberlin: [name] Other Clinician: Referring Agape Hardiman: Treating Marven Veley/Extender: [name] in Treatment: 23 Wound Status Wound Number: 3 Primary Venous Leg Ulcer Etiology: Wound Location: Right, Posterior Lower Leg Wound Open Wounding Event: Trauma Status: Date Acquired: 06/06/2019 Comorbid Cataracts, Anemia, Arrhythmia, Congestive Heart Failure, Weeks Of Treatment: 23 History: Hypertension, Type II Diabetes, Gout, Neuropathy Clustered Wound: No Photos Photo Uploaded By: Mikeal Hawthorne on 01/15/2020 14:52:38 Wound Measurements Length: (cm) 6.4 Width: (cm) 3 Depth: (cm) 0.1 Area: (cm) 15.08 Volume: (cm) 1.508 % Reduction in Area: 33.1% % Reduction in Volume: 77.7% Epithelialization: Medium (34-66%) Tunneling: No Undermining: No Wound Description Classification: Full Thickness Without Exposed Support Structures Wound Margin: Flat and Intact Exudate Amount: Medium Exudate Type:  Serosanguineous Exudate Color: red, brown Foul Odor After Cleansing: No Slough/Fibrino Yes Wound Bed Granulation Amount: Large (67-100%) Exposed Structure Granulation Quality: Red Fascia Exposed: No Necrotic Amount: Small (1-33%) Fat Layer (Subcutaneous Tissue) Exposed: Yes Necrotic Quality: Adherent Slough Tendon Exposed: No Muscle Exposed: No Joint Exposed: No Bone Exposed: No Electronic Signature(s) Signed: 01/15/2020 5:26:58 PM By: Kela Millin Entered By: Kela Millin on 01/14/2020 10:17:08 -------------------------------------------------------------------------------- Wound Assessment Details Patient Name: Date of Service: [name], [name] HA M 01/14/2020 10:30 A M Medical Record Number: [id number] Patient Account Number: [id number] Date of Birth/Sex: Treating RN: [birth year] (74 y.o. [name] Primary Care Loretta Kluender: [name] Other Clinician: Referring Shantell Belongia: Treating Makenzy Krist/Extender: [name] in Treatment: 23 Wound Status Wound Number: 5 Primary Diabetic Wound/Ulcer of the Lower Extremity Etiology: Wound Location: Right T Fifth oe Wound Open Wounding Event: Blister Status: Date Acquired: 09/19/2019 Comorbid Cataracts, Anemia, Arrhythmia, Congestive Heart Failure, Weeks Of Treatment: 16 History: Hypertension, Type II Diabetes, Gout, Neuropathy Clustered Wound: No Photos Photo Uploaded By: Mikeal Hawthorne on 01/15/2020 14:52:39 Wound Measurements Length: (cm) 0.2 Width: (cm) 0.2 Depth: (cm) 0.1 Area: (cm) 0.031 Volume: (cm) 0.003 % Reduction in Area: 94.7% % Reduction in Volume: 94.9% Epithelialization: Medium (34-66%) Tunneling: No Undermining: No Wound Description Classification: Grade 2 Wound Margin: Flat and Intact Exudate Amount: Small Exudate Type: Serous Exudate Color: amber Foul Odor After Cleansing: No Slough/Fibrino Yes Wound Bed Granulation Amount: Medium (34-66%)  Exposed Structure Granulation Quality: Pink, Pale Fascia Exposed: No Necrotic Amount: Medium (34-66%) Fat Layer (Subcutaneous Tissue) Exposed: Yes Necrotic Quality: Adherent Slough Tendon Exposed: No Muscle Exposed: No Joint Exposed: No Bone Exposed: No Electronic Signature(s) Signed: 01/15/2020 5:26:58 PM By: Kela Millin Entered By: Kela Millin on 01/14/2020 10:17:22 -------------------------------------------------------------------------------- Vitals Details Patient Name: Date of Service: [name] HA M 01/14/2020 10:30 A M Medical Record Number: [id number] Patient Account Number: [id number] Date of Birth/Sex: Treating RN: [birth year] (73  y.o. [name]) [name], Larene Beach Primary Care Braulio Kiedrowski: [name] Other Clinician: Referring Joanmarie Tsang: Treating Rushton Early/Extender: [name] in Treatment: 23 Vital Signs Time Taken: 10:10 Temperature (F): 97.6 Height (in): 67 Pulse (bpm): 52 Weight (lbs): 190 Respiratory Rate (breaths/min): 19 Body Mass Index (BMI): 29.8 Blood Pressure (mmHg): 147/68 Reference Range: 80 - 120 mg / dl Electronic Signature(s) Signed: 01/15/2020 5:26:58 PM By: Kela Millin Entered By: Kela Millin on 01/14/2020 10:12:08

## 2020-02-04 ENCOUNTER — Encounter (HOSPITAL_BASED_OUTPATIENT_CLINIC_OR_DEPARTMENT_OTHER): Payer: Medicare Other | Admitting: Internal Medicine

## 2020-02-04 DIAGNOSIS — E11621 Type 2 diabetes mellitus with foot ulcer: Secondary | ICD-10-CM | POA: Diagnosis not present

## 2020-02-05 NOTE — Progress Notes (Signed)
HOOVER, GREWE ([id number]) Visit Report for 02/04/2020 Arrival Information Details Patient Name: Date of Service: LABRIAN, TORREGROSSA 02/04/2020 8:15 A M Medical Record Number: [id number] Patient Account Number: [id number] Date of Birth/Sex: Treating RN: Jul 11, 1946 (74 y.o. [name]) Carlene Coria Primary Care Mayari Matus: [name] Other Clinician: Referring Drexler Maland: Treating Raygen Linquist/Extender: [name] in Treatment: 26 Visit Information History Since Last Visit Added or deleted any medications: No Patient Arrived: Ambulatory Any new allergies or adverse reactions: No Arrival Time: 08:12 Had a fall or experienced change in No Accompanied By: self activities of daily living that may affect Transfer Assistance: None risk of falls: Patient Identification Verified: Yes Signs or symptoms of abuse/neglect since last visito No Secondary Verification Process Completed: Yes Hospitalized since last visit: No Patient Requires Transmission-Based Precautions: No Implantable device outside of the clinic excluding No Patient Has Alerts: Yes cellular tissue based products placed in the center Patient Alerts: non compressable since last visit: Has Dressing in Place as Prescribed: Yes Has Compression in Place as Prescribed: Yes Pain Present Now: No Electronic Signature(s) Signed: 02/05/2020 5:13:38 PM By: Carlene Coria RN Entered By: Carlene Coria on 02/04/2020 08:13:11 -------------------------------------------------------------------------------- Compression Therapy Details Patient Name: Date of Service: STARLIN, STEIB HA M 02/04/2020 8:15 A M Medical Record Number: [id number] Patient Account Number: [id number] Date of Birth/Sex: Treating RN: [birth year] (74 y.o. [name]) Carlene Coria Primary Care Heavenlee Maiorana: [name] Other Clinician: Referring Jary Louvier: Treating Mercer Peifer/Extender: [name] in Treatment:  26 Compression Therapy Performed for Wound Assessment: Wound #3 Right,Posterior Lower Leg Performed By: Jake Church, RN Compression Type: Rolena Infante Electronic Signature(s) Signed: 02/05/2020 5:13:38 PM By: Carlene Coria RN Entered By: Carlene Coria on 02/04/2020 08:22:12 -------------------------------------------------------------------------------- Encounter Discharge Information Details Patient Name: Date of Service: ESIAS, MORY HA M 02/04/2020 8:15 A M Medical Record Number: [id number] Patient Account Number: [id number] Date of Birth/Sex: Treating RN: [birth year] (74 y.o. Oval Linsey Primary Care Hyun Marsalis: [name] Other Clinician: Referring Torrence Hammack: Treating Asya Derryberry/Extender: [name] in Treatment: 26 Encounter Discharge Information Items Discharge Condition: Stable Ambulatory Status: Ambulatory Discharge Destination: Home Transportation: Private Auto Accompanied By: self Schedule Follow-up Appointment: Yes Clinical Summary of Care: Electronic Signature(s) Signed: 02/05/2020 5:13:38 PM By: Carlene Coria RN Entered By: Carlene Coria on 02/04/2020 08:24:04 -------------------------------------------------------------------------------- Patient/Caregiver Education Details Patient Name: Date of Service: [name] 5/17/2021andnbsp8:15 A M Medical Record Number: [id number] Patient Account Number: [id number] Date of Birth/Gender: Treating RN: Oct 17, 1945 (74 y.o. [name]) Carlene Coria Primary Care Physician: [name] Other Clinician: Referring Physician: Treating Physician/Extender: [name] in Treatment: 26 Education Assessment Education Provided To: Patient Education Topics Provided Wound/Skin Impairment: Handouts: Skin Care Do's and Dont's Methods: Explain/Verbal Responses: Reinforcements needed Electronic Signature(s) Signed: 02/05/2020 5:13:38 PM By: Carlene Coria RN Entered By: Carlene Coria on 02/04/2020 08:23:56 -------------------------------------------------------------------------------- Wound Assessment Details Patient Name: Date of Service: JESON, CAMACHO HA M 02/04/2020 8:15 A M Medical Record Number: [id number] Patient Account Number: [id number] Date of Birth/Sex: Treating RN: [birth year] (74 y.o. [name]) Carlene Coria Primary Care Tyner Codner: [name] Other Clinician: Referring Marquasha Brutus: Treating Sidney Silberman/Extender: [name] in Treatment: 26 Wound Status Wound Number: 10 Primary Skin Tear Etiology: Wound Location: Right Forearm Wound Open Wounding Event: Trauma Status: Date Acquired: 01/15/2020 Comorbid Cataracts, Anemia, Arrhythmia, Congestive Heart Failure, Weeks Of Treatment: 1 History: Hypertension, Type II Diabetes, Gout, Neuropathy Clustered Wound:  No Wound Measurements Length: (cm) 0.7 Width: (cm) 3 Depth: (cm) 0.1 Area: (cm) 1.649 Volume: (cm) 0.165 % Reduction in Area: 0% % Reduction in Volume: 0% Epithelialization: None Tunneling: No Undermining: No Wound Description Classification: Full Thickness Without Exposed Support Structures Wound Margin: Distinct, outline attached Exudate Amount: Small Exudate Type: Serosanguineous Exudate Color: red, brown Foul Odor After Cleansing: No Slough/Fibrino Yes Wound Bed Granulation Amount: Small (1-33%) Exposed Structure Granulation Quality: Red, Pink Fascia Exposed: No Necrotic Amount: Large (67-100%) Fat Layer (Subcutaneous Tissue) Exposed: Yes Necrotic Quality: Adherent Slough Tendon Exposed: No Muscle Exposed: No Joint Exposed: No Bone Exposed: No Treatment Notes Wound #10 (Right Forearm) 1. Cleanse With Wound Cleanser 3. Primary Dressing Applied Xeroform Gauze 4. Secondary Dressing Dry Gauze Roll Gauze 5. Secured With Medipore tape Notes netting. Electronic Signature(s) Signed: 02/05/2020 5:13:38 PM  By: Carlene Coria RN Entered By: Carlene Coria on 02/04/2020 08:21:12 -------------------------------------------------------------------------------- Wound Assessment Details Patient Name: Date of Service: BRANDIS, MATSUURA HA M 02/04/2020 8:15 A M Medical Record Number: [id number] Patient Account Number: [id number] Date of Birth/Sex: Treating RN: Feb 09, 1946 (74 y.o. [name]) Carlene Coria Primary Care Elani Delph: [name] Other Clinician: Referring Matrice Herro: Treating Kasper Mudrick/Extender: [name] in Treatment: 26 Wound Status Wound Number: 3 Primary Venous Leg Ulcer Etiology: Wound Location: Right, Posterior Lower Leg Wound Open Wounding Event: Trauma Status: Date Acquired: 06/06/2019 Comorbid Cataracts, Anemia, Arrhythmia, Congestive Heart Failure, Weeks Of Treatment: 26 History: Hypertension, Type II Diabetes, Gout, Neuropathy Clustered Wound: No Wound Measurements Length: (cm) 5.4 Width: (cm) 2.3 Depth: (cm) 0.1 Area: (cm) 9.755 Volume: (cm) 0.975 % Reduction in Area: 56.7% % Reduction in Volume: 85.6% Epithelialization: Large (67-100%) Tunneling: No Undermining: No Wound Description Classification: Full Thickness Without Exposed Support Structures Wound Margin: Flat and Intact Exudate Amount: Medium Exudate Type: Serosanguineous Exudate Color: red, brown Foul Odor After Cleansing: No Slough/Fibrino No Wound Bed Granulation Amount: Large (67-100%) Exposed Structure Granulation Quality: Red Fascia Exposed: No Necrotic Amount: None Present (0%) Fat Layer (Subcutaneous Tissue) Exposed: Yes Tendon Exposed: No Muscle Exposed: No Joint Exposed: No Bone Exposed: No Treatment Notes Wound #3 (Right, Posterior Lower Leg) 1. Cleanse With Wound Cleanser Soap and water 2. Periwound Care Moisturizing lotion 3. Primary Dressing Applied Hydrofera Blue 4. Secondary Dressing Kerramax/Xtrasorb 6. Support Layer Campbell [employer] Notes HB classic with normal saline. foam applied for protection at achilles area per patient. Electronic Signature(s) Signed: 02/05/2020 5:13:38 PM By: Carlene Coria RN Entered By: Carlene Coria on 02/04/2020 08:21:31 -------------------------------------------------------------------------------- Wound Assessment Details Patient Name: Date of Service: AMBER, GUTHRIDGE HA M 02/04/2020 8:15 A M Medical Record Number: [id number] Patient Account Number: [id number] Date of Birth/Sex: Treating RN: 02/15/1946 (73 y.o. [name]) Carlene Coria Primary Care Keiri Solano: [name] Other Clinician: Referring Kimberlea Schlag: Treating Elisabel Hanover/Extender: [name] in Treatment: 26 Wound Status Wound Number: 5 Primary Diabetic Wound/Ulcer of the Lower Extremity Etiology: Wound Location: Right T Fifth oe Wound Open Wounding Event: Blister Status: Date Acquired: 09/19/2019 Comorbid Cataracts, Anemia, Arrhythmia, Congestive Heart Failure, Weeks Of Treatment: 19 History: Hypertension, Type II Diabetes, Gout, Neuropathy Clustered Wound: No Wound Measurements Length: (cm) 0.2 Width: (cm) 0.2 Depth: (cm) 0.1 Area: (cm) 0.031 Volume: (cm) 0.003 % Reduction in Area: 94.7% % Reduction in Volume: 94.9% Epithelialization: Large (67-100%) Tunneling: No Undermining: No Wound Description Classification: Grade 2 Wound Margin: Flat and Intact Exudate Amount: Small Exudate Type: Serous Exudate Color: amber Foul Odor After Cleansing: No Slough/Fibrino No Wound Bed  Granulation Amount: Large (67-100%) Exposed Structure Granulation Quality: Pink Fascia Exposed: No Necrotic Amount: None Present (0%) Fat Layer (Subcutaneous Tissue) Exposed: No Tendon Exposed: No Muscle Exposed: No Joint Exposed: No Bone Exposed: No Treatment Notes Wound #5 (Right Toe Fifth) 1. Cleanse With Wound Cleanser Soap and water 3. Primary Dressing Applied Collegen AG Hydrogel or K-Y  Jelly 4. Secondary Dressing Dry Gauze Roll Gauze 5. Secured With Medipore tape Notes rolled gauze under toe as directed by MD. Electronic Signature(s) Signed: 02/05/2020 5:13:38 PM By: Carlene Coria RN Entered By: Carlene Coria on 02/04/2020 08:21:52 -------------------------------------------------------------------------------- Vitals Details Patient Name: Date of Service: AHMEER, TUMAN HA M 02/04/2020 8:15 A M Medical Record Number: [id number] Patient Account Number: [id number] Date of Birth/Sex: Treating RN: 06/26/1946 (73 y.o. [name]) Carlene Coria Primary Care Jamilya Sarrazin: [name] Other Clinician: Referring Dilyn Smiles: Treating Nickalos Petersen/Extender: [name] in Treatment: 26 Vital Signs Time Taken: 08:12 Temperature (F): 98.3 Height (in): 67 Pulse (bpm): 65 Weight (lbs): 190 Respiratory Rate (breaths/min): 20 Body Mass Index (BMI): 29.8 Blood Pressure (mmHg): 172/83 Reference Range: 80 - 120 mg / dl Electronic Signature(s) Signed: 02/05/2020 5:13:38 PM By: Carlene Coria RN Entered By: Carlene Coria on 02/04/2020 08:14:31

## 2020-02-05 NOTE — Progress Notes (Signed)
DARRELLE, WIBERG ([id number]) Visit Report for 02/04/2020 SuperBill Details Patient Name: Date of Service: AZUL, BRUMETT 02/04/2020 Medical Record Number: [id number] Patient Account Number: [id number] Date of Birth/Sex: Treating RN: 1946-01-30 (73 y.o. 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

## 2020-02-11 ENCOUNTER — Encounter (HOSPITAL_BASED_OUTPATIENT_CLINIC_OR_DEPARTMENT_OTHER): Payer: Medicare Other | Admitting: Internal Medicine

## 2020-02-11 DIAGNOSIS — E11621 Type 2 diabetes mellitus with foot ulcer: Secondary | ICD-10-CM | POA: Diagnosis not present

## 2020-02-14 NOTE — Progress Notes (Signed)
JONTY, MORRICAL ([id number]) Visit Report for 02/11/2020 HPI Details Patient Name: Date of Service: SENCERE, SYMONETTE 02/11/2020 8:15 A M Medical Record Number: [id number] Patient Account Number: [id number] Date of Birth/Sex: Treating RN: 1946-05-22 (74 y.o. [name] Primary Care Provider: [name] Other Clinician: Referring Provider: Treating Provider/Extender: Mitzie Na in Treatment: 6 History of Present Illness HPI Description: [name] HPI Description: 74 year old gentleman who was recently seen by his nephrologist [name], and noted to have a wound on his left lower extremity which was lacerated 2 months ago and now has reopened. The patient's left shin has a ulceration with some exudate but no evidence of infection and he was referred to [place] for further care as it was known that the patient has had some peripheral vascular disease in the past. Past medical history significant for chronic kidney disease, atrial fibrillation, diabetes mellitus,status post kidney transplant in 1983 and 2005, a week fistula graft placement, status post previous bowel surgery. 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 10/06/2015 -- x-ray of the left tibia and fibula shows no evidence of osteomyelitis. The patient has also had Doppler studies of his extremity and is awaiting the appointment with the vascular surgeon. We have not yet received these reports. 10/13/2015 -- lower extremity venous duplex reflux evaluation shows reflux in the left common femoral vein, left saphenofemoral junction and the proximal greater saphenous vein extending to the proximal calf. There is also reflux in the left proximal to mid small saphenous vein. Arterial duplex studies done showed the resting ABI was not applicable due to tibial artery medial calcification. 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 The right TBI was 1 which is normal and the left ABI was 1 which is normal. Patient has otherwise been doing fine and has been compliant with his dressings. 10/20/2015 -- He was seen by [name] recently for a vascular opinion on 10/15/2015. His left lower extremity venous insufficiency duplex study revealed GSV reflux,SS vein reflux and deep venous reflux in the common femoral vein. His ABIs were non compressible and his TBI on the right was 1.01 and on the left was 0.80. He was asked to continue with the wound care with compressive therapy followed by EVLA of the left GS vein 3 months. He recommended 20-30 mm thigh- high compression stockings and the need for a three-month trial of this. The patient had an Unna boot applied at the vascular office but he could not tolerate this with a lot of pain and issues with his toes and hence came here on Friday for removal of this and we reapplied a 2 layer compression. 11/10/2015 -- patient still has not purchased his 20-30 mm thigh-high compression stockings as prescribed by [name]. Readmission: 08/08/18 on evaluation today patient presents for readmission concerning a new injury to the left anterior lower extremity. He was previously seen in 2017 here in our clinic. He states that he has done fairly well since that point. Nonetheless he is having at this time some pain but states that he hit this on a table that fell over and actually struck his leg. This appears to have pulled back some of his skin which folded in on itself and is causing some difficulty as far as that is concerned. There does  not appear to be any evidence of infection at this time. No fevers, chills, nausea, or vomiting noted at this time. He's been using dressings on his own currently without complication. 08/15/18 on evaluation today patient actually appears to be doing  somewhat better in regard to his wound of the lower [name] when compared to the first visit last week. I had to do a much more extensive debridement at that time it does appear that I'm gonna have to perform some debridement today but it does not look to be as extensive by any means. Nonetheless fortunately he does not show any signs of infection he does have discomfort at this site. I believe based on what I'm seeing currently he may benefit from Iodoflex to help keep the wound bed clean. Patient tolerated therapy without complication. Upon evaluation today the patient actually appears to be doing excellent in regard to his left lower extremity ulcer. This is much better than the previous two visits where he had a lot of necrotic tissue around the edge of the wound simply due to the fact that again there was a significant skin tear where the edge had been cleared away prior to reattaching and being able to heal appropriately. He seems to be doing much better at this point. 08/28/18 on evaluation today the patient's wound actually does appear to be showing signs of improvement. With that being said though he is improving he would likely note even greater improvement if we were able to sharply debride the wound. Nonetheless this caused him to much discomfort he tells me. 09/04/18 on evaluation today patient actually appears to be showing signs of improvement in regard to his left lower extremity ulcer. He has been tolerating the dressing changes including the wrap although he tells me at this point that the burning does last for a couple of days even with just the Iodoflex. I was afraid that this may been part of the issue that he was having with discomfort. It does seem to be the case. Nonetheless he shows no signs of evidence of infection at this time which is good news. No fevers chills noted ADMISSION to [name] 10/05/2018 This is a patient who was cared for in 2017 and again  in the fall of this year at our sister clinic and [place] [facility]. He actually lives in [place] in [place]. We have been dealing with an apparent traumatic area on the left anterior tibial area. This is been present for the last several months. He was supposed to be using Iodoflex Kerlix and Coban however he was hospitalized from 09/05/2018 through 09/11/2018 with delirium secondary to pneumonia. Since then he is only been putting Vaseline gauze on this without compression. He also has a more recent skin tear on the dorsal right hand that may have only happened in the last week. The patient had arterial studies done in 2017 in January which was 3 years ago. At that point he had noncompressible ABIs but really quite good TBI's both normal. Triphasic waveforms on the right monophasic at the left posterior tibial but triphasic at the left dorsalis pedis. His ABIs in our clinic today were both noncompressible 1/23; the patient has wounds on the right dorsal hand just distal to the wrist and on the left anterior lower extremity. Both of these look very healthy he is using Hydrofera Blue 1/30; left anterior lower extremity wound much smaller. Healthy looking surface. The laceration area just distal to the wrist on the dorsal  hand on the right is also just about closed I used Hydrofera Blue here 2/6; left anterior lower extremity wound is much smaller but still open. The laceration area just distal to the wrist on the dorsal hand is fully epithelialized. 2/13; the patient's anterior lower extremity wound is closed. The laceration just distal to the wrist on the dorsal hand is also fully epithelialized and closed. The patient has external compression stockings which I think are 20/30 READMISSION 08/06/2019 Mr. Matty is a 74 year old man with had several times previously in our clinic. He is a diabetic with a history of chronic renal insufficiency status post kidney transplant in 1983 and again in 2005. He  was then in 2017 with a laceration on the left lower extremity. He was worked up at the time with arterial studies and reflux studies. The arterial studies showed ABIs to be noncompressible but TBI's were within normal limits. I do not have the reflux studies at the moment. He was also sent here in 2019 with a left lower extremity wound and then again in 2020 with left lower extremity trauma a skin tear on the wrist. He was discharged with 20/30 stockings identified from myself that that might not be enough compression. Nevertheless he states he was wearing these fairly reliably. In September he had a fall with a substantial bruise in the area of the wound. He says he saw orthopedics and they told him there was some muscle strain sometime it later this opened into a wound. He has a fairly substantial wound on the right posterior calf. Satellite areas around this including medially and posteriorly. He has not worn his stockings since the injury Past medical history; includes chronic renal failure secondary to diabetes with kidney transplant x2, atrial fibrillation, heart failure with preserved ejection fraction, coronary artery disease. ABIs on the right in our clinic were once again noncompressible 08/13/2019 on evaluation today patient appears to be doing decently well with regard to his wound compared to last week's evaluation. Unfortunately he is still having a lot of discomfort at this point which is I think in some part due to the 3 layer compression wrap which is a little bit stronger I think for him. When he was here before we actually utilized a Kerlix and Coban wrap which he states seemed to got a little bit better. Nonetheless I think we can probably drop back to this in light of the discomfort that he had. Nonetheless the pain was not really right around the wound itself as much as it was around the ankle in particular. The Iodoflex does seem to have done well for him As the wound is  appearing somewhat better today which is excellent news. 11/30; still complaining of a lot of pain. Apparently arterial studies I ordered 2 weeks ago are below. I do not believe we have an appointment with vein and vascular as of yet; ABI Findings: +---------+------------------+-----+----------+--------+ Right Rt Pressure (mmHg)IndexWaveform Comment  +---------+------------------+-----+----------+--------+ PTA >254 1.50 monophasic  +---------+------------------+-----+----------+--------+ DP >254 1.50 monophasic  +---------+------------------+-----+----------+--------+ Great T oe68 0.40 Abnormal   +---------+------------------+-----+----------+--------+ +---------+------------------+-----+----------+-------+ Left Lt Pressure (mmHg)IndexWaveform Comment +---------+------------------+-----+----------+-------+ Brachial 169     +---------+------------------+-----+----------+-------+ PTA >254 1.50 monophasic  +---------+------------------+-----+----------+-------+ DP >254 1.50 monophasic  +---------+------------------+-----+----------+-------+ Great T oe40 0.24 Abnormal   +---------+------------------+-----+----------+-------+ Pedal arteries appear hyperemic. Patient refused Brachial pressure in the right arm. Summary: Right: Resting right ankle-brachial index indicates noncompressible right lower extremity arteries. The right toe-brachial index is abnormal. Left: Resting left ankle-brachial index indicates noncompressible left lower  extremity arteries. The left toe-brachial index is abnormal. He is also having considerably more swelling in his left calf. This was not there when I last saw him 2 weeks ago. He tells me that some of the home health compression wraps have been slipping down and that may be the issue here however a month I am uncertain 91/4; sees vascular on December 22. Still complaining of a lot of pain. DVT study I did last  time was negative for DVT 12/14; still complaining of pain which if this is arterial is certainly claudication and rest keeps him uncomfortable at night. He has an appointment with vein and vascular on December 22. Wound surface is better using Iodoflex. Once the surface of this is satisfactory and we have exhausted the vascular route. Perhaps an advanced treatment option. He has a configuration of the venous ulceration although his arterial studies are not very good. The other issue is the patient has a transplanted kidney. This will make angiography difficult and challenging issue 12/21; still complaining of pain and drainage. We are using Iodoflex on the wound under compression. He sees Dr. Donnetta Hutching tomorrow to evaluate his noninvasive studies noted above. He has a transplanted kidney further complicating the options for angiography. 12/28; still using Iodoflex under compression. I have Dr. Luther Parody note from 12/22. he noted arterial studies revealing monophasic waveforms at the pedal vessels bilaterally and calcified vessels making the ABI unreliable. He did not comment on the reduced TBI's. He felt these were venous wounds based on the palpable dorsalis pedis pulse. 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 And they arranged for formal venous duplex with reflux studies in the next several weeks. Follow-up with either Scot Dock or Dr. Oneida Alar 09/24/2019 still using Iodoflex under compression. He has an appointment with Dr. Doren Custard on 1/7 with regards to his venous disease. The patient was not felt to have a primary arterial problem for the nonhealing of his wound. We did attempt to wrap him with 3 layer when he first came into the clinic he complained of a lot of pain in the ankle although this may have been because the dressing fell down somewhat. He has far too edema fluid in the right leg for a good prognosis about healing this wound He comes in today with an excoriation on the bottom part of his  right fifth toe. He thinks he may have done this taking off his clothes and that something got caught on the toe. There is no open wound however the toe itself is very painful 1/11; we are using Iodoflex under compression. The wound bed is clean. He went on to see Dr. Doren Custard on 09/27/2019 he again feels that the patient's arterial supply is adequate. He feels that he might benefit from right greater saphenous vein ablation for a venous ulcer. In the meantime the area that he was complaining about last week on the right fifth toe. An x-ray that I ordered showed marked soft tissue swelling along the distal aspect of the right foot but there was no evidence of osteomyelitis. He comes in today with the fifth toenail just about coming off. He has black eschar underneath the toe on the plantar aspect. The toe was swollen red and very painful. In the right setting this could be a significant soft tissue infection versus ischemia of the toe itself. He did not show this to Dr. Doren Custard 1/18; I am using Iodoflex under compression a large wound on the right posterior calf. He had his  right greater saphenous vein ablation by Dr. Doren Custard although I am not sure that is the only problem here. The right fifth toe which was possibly trauma 2 weeks ago continues to be exceptionally painful with a necrotic tip. Maybe not quite as swollen. I started him empirically on Augmentin 5/125 1 p.o. twice daily last week after discussing this with Dr. Loletha Grayer of nephrology. Perhaps somewhat better this week but not as good as I was hoping. A plain x-ray was negative. He comes in today with an area on the medial right calf that was blistered and now open. In looking at things he appears to be systemically fluid volume overloaded. He has a transplanted kidney. He states he takes his Lasix variably when he has appointments he tries not to take it the later I am really not certain if he takes this reliably. However he has far too much edema fluid  in the right leg to easily heal this wound and he appears to be developing blisters medially to form additional wounds 1/25; his CO2 angiogram was done by Dr. Doren Custard last week. This showed the proximal arteries all to be patent. On the right side the common femoral deep femoral superficial femoral popliteal anterior tibial and peroneal arteries were patent. The posterior tibial was occluded but reconstituted distally via collaterals from the peroneal artery he was felt he should have enough blood flow to heal his wounds including the right fifth toe. The right fifth toe looks better however he is still complaining of a lot of pain. The large area which is a venous ulceration posteriorly has a better looking surface I think we can switch to Hydrofera Blue today 2/1; the patient's original wounds on the right posterior medial calf has come down in width however superior to this he has new denuded areas and I am concerned we simply do not have enough edema control. He has already undergone a right greater saphenous ablation. He had a CO2 angiogram done by Dr. Doren Custard and the comment was that we should have enough blood flow to heal the venous wound however we simply do not seem to have enough edema control with 3 layer compression. The patient is status post kidney transplant although his exact renal function is not really clear. Nor am i sure what dose of diuretic he is supposed to be on. He also has the area on the right fifth toe which was unclear etiology but I think became secondarily infected I gave him 2 weeks of antibiotics for this and this seems to have settled down he still has a black eschar over the tip of the toe. X-ray was negative for fracture. He says he has a history of gout. 2/8; the patient's wound on the right posterior calf is about the same. The superficial area medially also about the same. He got a prescription for prednisoneo Gout after he developed erythema on the dorsal aspect of  his left great toe going along with the right fifth toe which has been problematic all along. He has not taken it because he is concerned about increasing CBGs. He has a transplanted kidney is already on prednisone 5 mg. He would not be a good candidate for NSAIDs. Perhaps colchicine. He is not aware of what his uric acid level is 2/15; his right posterior calf wound seems to be coming in in terms of width. Everything here looks fairly good. No mechanical debridement we have been using Hydrofera Blue. He has had an ablation by vein  and vascular. Felt to have adequate arterial supply to this area. The patient got prednisone last week from Dr. Angelique Holm of nephrology. At my urging he actually took it. The area on his left toe was a lot better. The right toe was not as painful but still erythematous with a wound at the tip. We have been using silver alginate here 2/22; right posterior calf seems to be gradually epithelializing. Still a fairly substantial wound. He still has an area on the tip of his right fifth toe which I think was gout related. This is gradually improving. We have been using Unna boots to wrap X-ray I did of the foot last time was again negative there was soft tissue irregularity about the distal fifth digit but no radiographic evidence of bone damage 3/1; right posterior calf seems to be gradually improving however there were areas of hyper granulation. We have been using Hydrofera Blue. The hyper granulation is mostly evident in the most superior finger shape projection of the wound itself. The area on the right fifth toe also was slough covered and required debridement. 3/8 continued improvement in the right posterior calf and the tip of the right fifth toe. We have been using Hydrofera Blue under compression he is changing the area to the toe 3/22; continued improvement in the right posterior calf. Unfortunately comes in today with a new skin tear in the mid anterior tibia area  this probably had something to do with changing his dressing home health I called and left this a message last week. 3/29; continued improvement in the a substantial wound on the right posterior calf. The skin tear anteriorly from last week is already healed on the tip of the right fifth toe there is still a nonviable area. 4/5; we have continued contraction of the substantial wound on the right posterior calf. We continue to have problems with the tip of the right fifth toe. We have been using Hydrofera Blue to both wound areas He is complaining about some discomfort in the Achilles area. T me he had surgery for what sounds like a torn Achilles about 10 years ago. ells 4/12; we have continued contraction of the substantial wound on the right posterior calf. Still an area on the tip of the right fifth toe. I changed him to silver collagen on the toe last week but I think home health continue to use Hydrofera Blue to both wound areas. 4/19; we continue to have contraction of the substantial wound on the right posterior calf however there continues to be hyper granulation. The area on the tip of the fifth toe is very small still some debris on the surface we have been using silver collagen 4/26; both wounds have contracted. I was hoping the fifth toe wound would close over but with probing there is still an open small hole here. 5/3; his wound on the right posterior calf which was his major area continues to contract looks healthy we have been using Hydrofera Blue. Using silver collagen to the fifth toe, not making a lot of progress here 5/10; right posterior calf wound continues to be smaller in terms of surface area and look healthy we have been using Hydrofera Blue here. The tip of the right fifth toe is still open requiring debridement. He has a new laceration on the right forearm he says he hit this on a microwave 6 days ago 02/11/20-The right fifth toe wound is closed, the right posterior calf  wound looks healthy with smaller dimensions  compared with last time, the right forearm area of laceration has a small blister which I try to open up with scalpel with very little fluid Electronic Signature(s) Signed: 02/11/2020 8:57:51 AM By: Tobi Bastos MD, MBA Entered By: Tobi Bastos on 02/11/2020 08:57:50 -------------------------------------------------------------------------------- Physical Exam Details Patient Name: Date of Service: CAMARA, ROSANDER HA M 02/11/2020 8:15 A M Medical Record Number: [id number] Patient Account Number: [id number] Date of Birth/Sex: Treating RN: 01/29/1946 (74 y.o. [name] Primary Care Provider: [name] Other Clinician: Referring Provider: Treating Provider/Extender: Mitzie Na in Treatment: 14 Constitutional alert and oriented x 3. sitting or standing blood pressure is within target range for patient.. supine blood pressure is within target range for patient.. pulse regular and within target range for patient.Marland Kitchen respirations regular, non-labored and within target range for patient.Marland Kitchen temperature within target range for patient.. . . Well- nourished and well-hydrated in no acute distress. Notes Right posterior calf wound looks healthy with surrounding skin intact Right forearm laceration area is healing up with small area of blister Electronic Signature(s) Signed: 02/11/2020 8:58:23 AM By: Tobi Bastos MD, MBA Entered By: Tobi Bastos on 02/11/2020 08:58:22 -------------------------------------------------------------------------------- Physician Orders Details Patient Name: Date of Service: CHEIKH, BRAMBLE HA M 02/11/2020 8:15 A M Medical Record Number: [id number] Patient Account Number: [id number] Date of Birth/Sex: Treating RN: 02-28-1946 (73 y.o. [name] Primary Care Provider: [name] Other Clinician: Referring Provider: Treating Provider/Extender: Mitzie Na in Treatment: 61 Verbal / Phone Orders: No Diagnosis Coding ICD-10 Coding Code Description S80.11XD Contusion of right lower leg, subsequent encounter L97.212 Non-pressure chronic ulcer of right calf with fat layer exposed I87.321 Chronic venous hypertension (idiopathic) with inflammation of right lower extremity E11.622 Type 2 diabetes mellitus with other skin ulcer L97.518 Non-pressure chronic ulcer of other part of right foot with other specified severity S41.111D Laceration without foreign body of right upper arm, subsequent encounter Follow-up Appointments Return Appointment in 2 weeks. Dressing Change Frequency Wound #10 Right Forearm Change dressing every day. Wound #3 Right,Posterior Lower Leg Other: - twice a week Skin Barriers/Peri-Wound Care Wound #3 Right,Posterior Lower Leg Barrier cream Moisturizing lotion - to leg Wound Cleansing Clean wound with Normal Saline. - or normal saline on days that dressing is changed May shower with protection. Primary Wound Dressing Wound #10 Right Forearm Xeroform Wound #3 Right,Posterior Lower Leg Hydrofera Blue - Classic Secondary Dressing Wound #10 Right Forearm Kerlix/Rolled Gauze Dry Gauze Wound #3 Right,Posterior Lower Leg Foam - foam top bend of foot for protection ABD pad Zetuvit or Kerramax - or Xtrasorb Edema Control Unna Boot to Right Lower Extremity - *****ONLY ONE SINGLE LAYER OF UNNA BOOT***** - foam to achilles to pad Avoid standing for long periods of time Elevate legs to the level of the heart or above for 30 minutes daily and/or when sitting, a frequency of: - throughout the day Exercise regularly Additional Orders / Instructions Other: - Go to ER if toe/leg gets more red/black, increased pain, fever [facility] for wound care. - [facility] Electronic Signature(s) Signed: 02/11/2020 5:12:22 PM By: [name] RN, BSN Signed:  02/14/2020 4:57:23 PM By: Tobi Bastos MD, MBA Entered By: [name] on 02/11/2020 08:54:30 -------------------------------------------------------------------------------- Problem List Details Patient Name: Date of Service: LIBORIO, SACCENTE HA M 02/11/2020 8:15 A M Medical Record Number: [id number] Patient Account Number: [id number] Date of Birth/Sex: Treating RN: 1945-11-06 (74 y.o. [name] Primary Care  Provider: [name] Other Clinician: Referring Provider: Treating Provider/Extender: Mitzie Na in Treatment: 27 Active Problems ICD-10 Encounter Code Description Active Date MDM Diagnosis S80.11XD Contusion of right lower leg, subsequent encounter 08/06/2019 No Yes L97.212 Non-pressure chronic ulcer of right calf with fat layer exposed 08/06/2019 No Yes I87.321 Chronic venous hypertension (idiopathic) with inflammation of right lower 08/06/2019 No Yes extremity E11.622 Type 2 diabetes mellitus with other skin ulcer 08/06/2019 No Yes L97.518 Non-pressure chronic ulcer of other part of right foot with other specified 10/01/2019 No Yes severity S41.111D Laceration without foreign body of right upper arm, subsequent encounter 01/28/2020 No Yes Inactive Problems ICD-10 Code Description Active Date Inactive Date L97.811 Non-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 12/10/2019 12/10/2019 Resolved Problems Electronic Signature(s) Signed: 02/11/2020 5:12:22 PM By: [name] RN, BSN Signed: 02/14/2020 4:57:23 PM By: Tobi Bastos MD, MBA Entered By: [name] on 02/11/2020 08:52:07 -------------------------------------------------------------------------------- Progress Note Details Patient Name: Date of Service: GAD, AYMOND HA M 02/11/2020 8:15 A M Medical Record Number: [id number] Patient Account Number: [id number] Date of Birth/Sex: Treating RN: [birth year] (74 y.o. [name] Primary Care Provider: [name] Other Clinician: Referring Provider: Treating Provider/Extender: Mitzie Na in Treatment: 27 Subjective History of Present Illness (HPI) [place] HPI Description: 74 year old gentleman who was recently seen by his nephrologist [name], and noted to have a wound on his left lower extremity which was lacerated 2 months ago and now has reopened. The patient's left shin has a ulceration with some exudate but no evidence of infection and he was referred to [place] for further care as it was known that the patient has had some peripheral vascular disease in the past. Past medical history significant for chronic kidney disease, atrial fibrillation, diabetes mellitus,status post kidney transplant in 1983 and 2005, a week fistula graft placement, status post previous bowel surgery. 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 10/06/2015 -- x-ray of the left tibia and fibula shows no evidence of osteomyelitis. The patient has also had Doppler studies of his extremity and is awaiting the appointment with the vascular surgeon. We have not yet received these reports. 10/13/2015 -- lower extremity venous duplex reflux evaluation shows reflux in the left common femoral vein, left saphenofemoral junction and the proximal greater saphenous vein extending to the proximal calf. There is also reflux in the left proximal to mid small saphenous vein. Arterial duplex studies done showed the resting ABI was not applicable due to tibial artery medial calcification. 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 The right TBI was 1 which is normal and the left ABI was 1 which is normal. Patient has otherwise been doing fine and has been compliant with his  dressings. 10/20/2015 -- He was seen by [name] recently for a vascular opinion on 10/15/2015. His left lower extremity venous insufficiency duplex study revealed GSV reflux,SS vein reflux and deep venous reflux in the common femoral vein. His ABIs were non compressible and his TBI on the right was 1.01 and on the left was 0.80. He was asked to continue with the wound care with compressive therapy followed by EVLA of the left GS vein 3 months. He recommended 20-30 mm thigh- high compression stockings and the  need for a three-month trial of this. The patient had an Unna boot applied at the vascular office but he could not tolerate this with a lot of pain and issues with his toes and hence came here on Friday for removal of this and we reapplied a 2 layer compression. 11/10/2015 -- patient still has not purchased his 20-30 mm thigh-high compression stockings as prescribed by [name]. Readmission: 08/08/18 on evaluation today patient presents for readmission concerning a new injury to the left anterior lower extremity. He was previously seen in 2017 here in our clinic. He states that he has done fairly well since that point. Nonetheless he is having at this time some pain but states that he hit this on a table that fell over and actually struck his leg. This appears to have pulled back some of his skin which folded in on itself and is causing some difficulty as far as that is concerned. There does not appear to be any evidence of infection at this time. No fevers, chills, nausea, or vomiting noted at this time. He's been using dressings on his own currently without complication. 08/15/18 on evaluation today patient actually appears to be doing somewhat better in regard to his wound of the lower [name] when compared to the first visit last week. I had to do a much more extensive debridement at that time it does appear that I'm gonna have to perform some debridement today but it does not look to  be as extensive by any means. Nonetheless fortunately he does not show any signs of infection he does have discomfort at this site. I believe based on what I'm seeing currently he may benefit from Iodoflex to help keep the wound bed clean. Patient tolerated therapy without complication. Upon evaluation today the patient actually appears to be doing excellent in regard to his left lower extremity ulcer. This is much better than the previous two visits where he had a lot of necrotic tissue around the edge of the wound simply due to the fact that again there was a significant skin tear where the edge had been cleared away prior to reattaching and being able to heal appropriately. He seems to be doing much better at this point. 08/28/18 on evaluation today the patient's wound actually does appear to be showing signs of improvement. With that being said though he is improving he would likely note even greater improvement if we were able to sharply debride the wound. Nonetheless this caused him to much discomfort he tells me. 09/04/18 on evaluation today patient actually appears to be showing signs of improvement in regard to his left lower extremity ulcer. He has been tolerating the dressing changes including the wrap although he tells me at this point that the burning does last for a couple of days even with just the Iodoflex. I was afraid that this may been part of the issue that he was having with discomfort. It does seem to be the case. Nonetheless he shows no signs of evidence of infection at this time which is good news. No fevers chills noted ADMISSION to [name] 10/05/2018 This is a patient who was cared for in 2017 and again in the fall of this year at our sister clinic and [place]. He actually lives in [place] in [place]. We have been dealing with an apparent traumatic area on the left anterior tibial area. This is been present for the last several months. He was supposed  to be using  Iodoflex Kerlix and Coban however he was hospitalized from 09/05/2018 through 09/11/2018 with delirium secondary to pneumonia. Since then he is only been putting Vaseline gauze on this without compression. He also has a more recent skin tear on the dorsal right hand that may have only happened in the last week. The patient had arterial studies done in 2017 in January which was 3 years ago. At that point he had noncompressible ABIs but really quite good TBI's both normal. Triphasic waveforms on the right monophasic at the left posterior tibial but triphasic at the left dorsalis pedis. His ABIs in our clinic today were both noncompressible 1/23; the patient has wounds on the right dorsal hand just distal to the wrist and on the left anterior lower extremity. Both of these look very healthy he is using Hydrofera Blue 1/30; left anterior lower extremity wound much smaller. Healthy looking surface. The laceration area just distal to the wrist on the dorsal hand on the right is also just about closed I used Hydrofera Blue here 2/6; left anterior lower extremity wound is much smaller but still open. The laceration area just distal to the wrist on the dorsal hand is fully epithelialized. 2/13; the patient's anterior lower extremity wound is closed. The laceration just distal to the wrist on the dorsal hand is also fully epithelialized and closed. The patient has external compression stockings which I think are 20/30 READMISSION 08/06/2019 Mr. Chiarelli is a 74 year old man with had several times previously in our clinic. He is a diabetic with a history of chronic renal insufficiency status post kidney transplant in 1983 and again in 2005. He was then in 2017 with a laceration on the left lower extremity. He was worked up at the time with arterial studies and reflux studies. The arterial studies showed ABIs to be noncompressible but TBI's were within normal limits. I do not have the reflux studies  at the moment. He was also sent here in 2019 with a left lower extremity wound and then again in 2020 with left lower extremity trauma a skin tear on the wrist. He was discharged with 20/30 stockings identified from myself that that might not be enough compression. Nevertheless he states he was wearing these fairly reliably. In September he had a fall with a substantial bruise in the area of the wound. He says he saw orthopedics and they told him there was some muscle strain sometime it later this opened into a wound. He has a fairly substantial wound on the right posterior calf. Satellite areas around this including medially and posteriorly. He has not worn his stockings since the injury Past medical history; includes chronic renal failure secondary to diabetes with kidney transplant x2, atrial fibrillation, heart failure with preserved ejection fraction, coronary artery disease. ABIs on the right in our clinic were once again noncompressible 08/13/2019 on evaluation today patient appears to be doing decently well with regard to his wound compared to last week's evaluation. Unfortunately he is still having a lot of discomfort at this point which is I think in some part due to the 3 layer compression wrap which is a little bit stronger I think for him. When he was here before we actually utilized a Kerlix and Coban wrap which he states seemed to got a little bit better. Nonetheless I think we can probably drop back to this in light of the discomfort that he had. Nonetheless the pain was not really right around the wound itself as much as it was around  the ankle in particular. The Iodoflex does seem to have done well for him As the wound is appearing somewhat better today which is excellent news. 11/30; still complaining of a lot of pain. Apparently arterial studies I ordered 2 weeks ago are below. I do not believe we have an appointment with vein and vascular as of yet; ABI  Findings: +---------+------------------+-----+----------+--------+ Right Rt Pressure (mmHg)IndexWaveform Comment  +---------+------------------+-----+----------+--------+ PTA >254 1.50 monophasic  +---------+------------------+-----+----------+--------+ DP >254 1.50 monophasic  +---------+------------------+-----+----------+--------+ Great T oe68 0.40 Abnormal   +---------+------------------+-----+----------+--------+ +---------+------------------+-----+----------+-------+ Left Lt Pressure (mmHg)IndexWaveform Comment +---------+------------------+-----+----------+-------+ Brachial 169     +---------+------------------+-----+----------+-------+ PTA >254 1.50 monophasic  +---------+------------------+-----+----------+-------+ DP >254 1.50 monophasic  +---------+------------------+-----+----------+-------+ Great T oe40 0.24 Abnormal   +---------+------------------+-----+----------+-------+ Pedal arteries appear hyperemic. Patient refused Brachial pressure in the right arm. Summary: Right: Resting right ankle-brachial index indicates noncompressible right lower extremity arteries. The right toe-brachial index is abnormal. Left: Resting left ankle-brachial index indicates noncompressible left lower extremity arteries. The left toe-brachial index is abnormal. He is also having considerably more swelling in his left calf. This was not there when I last saw him 2 weeks ago. He tells me that some of the home health compression wraps have been slipping down and that may be the issue here however a month I am uncertain 34/7; sees vascular on December 22. Still complaining of a lot of pain. DVT study I did last time was negative for DVT 12/14; still complaining of pain which if this is arterial is certainly claudication and rest keeps him uncomfortable at night. He has an appointment with vein and vascular on December 22. Wound surface is better  using Iodoflex. Once the surface of this is satisfactory and we have exhausted the vascular route. Perhaps an advanced treatment option. He has a configuration of the venous ulceration although his arterial studies are not very good. The other issue is the patient has a transplanted kidney. This will make angiography difficult and challenging issue 12/21; still complaining of pain and drainage. We are using Iodoflex on the wound under compression. He sees Dr. Donnetta Hutching tomorrow to evaluate his noninvasive studies noted above. He has a transplanted kidney further complicating the options for angiography. 12/28; still using Iodoflex under compression. I have Dr. Luther Parody note from 12/22. he noted arterial studies revealing monophasic waveforms at the pedal vessels bilaterally and calcified vessels making the ABI unreliable. He did not comment on the reduced TBI's. He felt these were venous wounds based on the palpable dorsalis pedis pulse. 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 And they arranged for formal venous duplex with reflux studies in the next several weeks. Follow-up with either Scot Dock or Dr. Oneida Alar 09/24/2019 still using Iodoflex under compression. He has an appointment with Dr. Doren Custard on 1/7 with regards to his venous disease. The patient was not felt to have a primary arterial problem for the nonhealing of his wound. We did attempt to wrap him with 3 layer when he first came into the clinic he complained of a lot of pain in the ankle although this may have been because the dressing fell down somewhat. He has far too edema fluid in the right leg for a good prognosis about healing this wound He comes in today with an excoriation on the bottom part of his right fifth toe. He thinks he may have done this taking off his clothes and that something got caught on the toe. There is no open wound however the toe itself is very painful 1/11; we  are using Iodoflex under compression. The wound bed is  clean. He went on to see Dr. Doren Custard on 09/27/2019 he again feels that the patient's arterial supply is adequate. He feels that he might benefit from right greater saphenous vein ablation for a venous ulcer. In the meantime the area that he was complaining about last week on the right fifth toe. An x-ray that I ordered showed marked soft tissue swelling along the distal aspect of the right foot but there was no evidence of osteomyelitis. He comes in today with the fifth toenail just about coming off. He has black eschar underneath the toe on the plantar aspect. The toe was swollen red and very painful. In the right setting this could be a significant soft tissue infection versus ischemia of the toe itself. He did not show this to Dr. Doren Custard 1/18; I am using Iodoflex under compression a large wound on the right posterior calf. He had his right greater saphenous vein ablation by Dr. Doren Custard although I am not sure that is the only problem here. ooThe right fifth toe which was possibly trauma 2 weeks ago continues to be exceptionally painful with a necrotic tip. Maybe not quite as swollen. I started him empirically on Augmentin 5/125 1 p.o. twice daily last week after discussing this with Dr. Loletha Grayer of nephrology. Perhaps somewhat better this week but not as good as I was hoping. A plain x-ray was negative. He comes in today with an area on the medial right calf that was blistered and now open. In looking at things he appears to be systemically fluid volume overloaded. He has a transplanted kidney. He states he takes his Lasix variably when he has appointments he tries not to take it the later I am really not certain if he takes this reliably. However he has far too much edema fluid in the right leg to easily heal this wound and he appears to be developing blisters medially to form additional wounds 1/25; his CO2 angiogram was done by Dr. Doren Custard last week. This showed the proximal arteries all to be patent. On the  right side the common femoral deep femoral superficial femoral popliteal anterior tibial and peroneal arteries were patent. The posterior tibial was occluded but reconstituted distally via collaterals from the peroneal artery he was felt he should have enough blood flow to heal his wounds including the right fifth toe. The right fifth toe looks better however he is still complaining of a lot of pain. The large area which is a venous ulceration posteriorly has a better looking surface I think we can switch to Hydrofera Blue today 2/1; the patient's original wounds on the right posterior medial calf has come down in width however superior to this he has new denuded areas and I am concerned we simply do not have enough edema control. He has already undergone a right greater saphenous ablation. He had a CO2 angiogram done by Dr. Doren Custard and the comment was that we should have enough blood flow to heal the venous wound however we simply do not seem to have enough edema control with 3 layer compression. The patient is status post kidney transplant although his exact renal function is not really clear. Nor am i sure what dose of diuretic he is supposed to be on. He also has the area on the right fifth toe which was unclear etiology but I think became secondarily infected I gave him 2 weeks of antibiotics for this and this seems to have  settled down he still has a black eschar over the tip of the toe. X-ray was negative for fracture. He says he has a history of gout. 2/8; the patient's wound on the right posterior calf is about the same. The superficial area medially also about the same. He got a prescription for prednisoneo Gout after he developed erythema on the dorsal aspect of his left great toe going along with the right fifth toe which has been problematic all along. He has not taken it because he is concerned about increasing CBGs. He has a transplanted kidney is already on prednisone 5 mg. He would not  be a good candidate for NSAIDs. Perhaps colchicine. He is not aware of what his uric acid level is 2/15; his right posterior calf wound seems to be coming in in terms of width. Everything here looks fairly good. No mechanical debridement we have been using Hydrofera Blue. He has had an ablation by vein and vascular. Felt to have adequate arterial supply to this area. The patient got prednisone last week from Dr. Angelique Holm of nephrology. At my urging he actually took it. The area on his left toe was a lot better. The right toe was not as painful but still erythematous with a wound at the tip. We have been using silver alginate here 2/22; right posterior calf seems to be gradually epithelializing. Still a fairly substantial wound. He still has an area on the tip of his right fifth toe which I think was gout related. This is gradually improving. We have been using Unna boots to wrap X-ray I did of the foot last time was again negative there was soft tissue irregularity about the distal fifth digit but no radiographic evidence of bone damage 3/1; right posterior calf seems to be gradually improving however there were areas of hyper granulation. We have been using Hydrofera Blue. The hyper granulation is mostly evident in the most superior finger shape projection of the wound itself. ooThe area on the right fifth toe also was slough covered and required debridement. 3/8 continued improvement in the right posterior calf and the tip of the right fifth toe. We have been using Hydrofera Blue under compression he is changing the area to the toe 3/22; continued improvement in the right posterior calf. Unfortunately comes in today with a new skin tear in the mid anterior tibia area this probably had something to do with changing his dressing home health I called and left this a message last week. 3/29; continued improvement in the a substantial wound on the right posterior calf. The skin tear anteriorly from  last week is already healed on the tip of the right fifth toe there is still a nonviable area. 4/5; we have continued contraction of the substantial wound on the right posterior calf. We continue to have problems with the tip of the right fifth toe. We have been using Hydrofera Blue to both wound areas He is complaining about some discomfort in the Achilles area. T me he had surgery for what sounds like a torn Achilles about 10 years ago. ells 4/12; we have continued contraction of the substantial wound on the right posterior calf. ooStill an area on the tip of the right fifth toe. I changed him to silver collagen on the toe last week but I think home health continue to use Hydrofera Blue to both wound areas. 4/19; we continue to have contraction of the substantial wound on the right posterior calf however there continues to be hyper  granulation. ooThe area on the tip of the fifth toe is very small still some debris on the surface we have been using silver collagen 4/26; both wounds have contracted. I was hoping the fifth toe wound would close over but with probing there is still an open small hole here. 5/3; his wound on the right posterior calf which was his major area continues to contract looks healthy we have been using Hydrofera Blue. Using silver collagen to the fifth toe, not making a lot of progress here 5/10; right posterior calf wound continues to be smaller in terms of surface area and look healthy we have been using Hydrofera Blue here. The tip of the right fifth toe is still open requiring debridement. He has a new laceration on the right forearm he says he hit this on a microwave 6 days ago 02/11/20-The right fifth toe wound is closed, the right posterior calf wound looks healthy with smaller dimensions compared with last time, the right forearm area of laceration has a small blister which I try to open up with scalpel with very little fluid Objective Constitutional alert and  oriented x 3. sitting or standing blood pressure is within target range for patient.. supine blood pressure is within target range for patient.. pulse regular and within target range for patient.Marland Kitchen respirations regular, non-labored and within target range for patient.Marland Kitchen temperature within target range for patient.. Well- nourished and well-hydrated in no acute distress. Vitals Time Taken: 8:19 AM, Height: 67 in, Weight: 190 lbs, BMI: 29.8, Temperature: 98.6 F, Pulse: 53 bpm, Respiratory Rate: 20 breaths/min, Blood Pressure: 147/53 mmHg. General Notes: Right posterior calf wound looks healthy with surrounding skin intact Right forearm laceration area is healing up with small area of blister Integumentary (Hair, Skin) Wound #10 status is Open. Original cause of wound was Trauma. The wound is located on the Right Forearm. The wound measures 1.3cm length x 2.2cm width x 0.1cm depth; 2.246cm^2 area and 0.225cm^3 volume. There is Fat Layer (Subcutaneous Tissue) Exposed exposed. There is no tunneling or undermining noted. There is a small amount of serosanguineous drainage noted. The wound margin is distinct with the outline attached to the wound base. There is small (1- 33%) red, pink granulation within the wound bed. There is a large (67-100%) amount of necrotic tissue within the wound bed including Adherent Slough. Wound #3 status is Open. Original cause of wound was Trauma. The wound is located on the Right,Posterior Lower Leg. The wound measures 4.5cm length x 1.6cm width x 0.1cm depth; 5.655cm^2 area and 0.565cm^3 volume. There is Fat Layer (Subcutaneous Tissue) Exposed exposed. There is no tunneling or undermining noted. There is a medium amount of serosanguineous drainage noted. The wound margin is flat and intact. There is large (67-100%) red granulation within the wound bed. There is no necrotic tissue within the wound bed. Wound #5 status is Open. Original cause of wound was Blister. The wound is  located on the Right T Fifth. The wound measures 0cm length x 0cm width x oe 0cm depth; 0cm^2 area and 0cm^3 volume. There is no tunneling or undermining noted. There is a none present amount of drainage noted. The wound margin is flat and intact. There is no granulation within the wound bed. There is no necrotic tissue within the wound bed. 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 There was a Haematologist Compression Therapy Procedure by [name], RN. Post procedure Diagnosis Wound #3: Same as Pre-Procedure Plan Follow-up Appointments: Return Appointment in 2 weeks. Dressing Change Frequency: Wound #10 Right Forearm: Change dressing every day. Wound #3 Right,Posterior Lower Leg: Other: - twice a week Skin Barriers/Peri-Wound Care: Wound #3 Right,Posterior Lower Leg: Barrier cream Moisturizing lotion - to leg Wound Cleansing: Clean wound with Normal Saline. - or normal saline on days that dressing is changed May shower with protection. Primary Wound Dressing: Wound #10 Right Forearm: Xeroform Wound #3 Right,Posterior Lower Leg: Hydrofera Blue - Classic Secondary Dressing: Wound #10 Right Forearm: Kerlix/Rolled Gauze Dry Gauze Wound #3 Right,Posterior Lower Leg: Foam - foam top bend of foot for protection ABD pad Zetuvit or Kerramax - or Xtrasorb Edema Control: Unna Boot to Right Lower Extremity - *****ONLY ONE SINGLE LAYER OF UNNA BOOT***** - foam to achilles  to pad Avoid standing for long periods of time Elevate legs to the level of the heart or above for 30 minutes daily and/or when sitting, a frequency of: - throughout the day Exercise regularly Additional Orders / Instructions: Other: - Go to ER if toe/leg gets more red/black, increased pain, fever Home Health: [facility] for [facility]. - [facility] 1. Continue Hydrofera Blue to the right posterior leg wound with Unna boot compression 2. Wants to continue Xeroform to the right forearm with daily dressing changes, which is reasonable 3. Return to clinic next week Electronic Signature(s) Signed: 02/11/2020 8:59:09 AM By: Tobi Bastos MD, MBA Entered By: Tobi Bastos on 02/11/2020 08:59:09 -------------------------------------------------------------------------------- [place] Details Patient Name: Date of Service: WAKE, CONLEE HA M 02/11/2020 Medical Record Number: [id number] Patient Account Number: [id number] Date of Birth/Sex: Treating RN: September 03, 1946 (74 y.o. [name] Primary Care Provider: [name] Other Clinician: Referring Provider: Treating Provider/Extender: Mitzie Na in Treatment: 27 Diagnosis Coding ICD-10 Codes Code Description S80.11XD Contusion of right lower leg, subsequent encounter L97.212 Non-pressure chronic ulcer of right calf with fat layer exposed I87.321 Chronic venous hypertension (idiopathic) with inflammation of right lower extremity E11.622 Type 2 diabetes mellitus with other skin ulcer L97.518 Non-pressure chronic ulcer of other part of right foot with other specified severity S41.111D Laceration without foreign body of right upper arm, subsequent encounter Facility Procedures CPT4 Code: 35573220 Description: (Facility Use Only) [id number] - Dorise Bullion BOOT RT Modifier: Quantity: 1 Physician Procedures : CPT4 Code Description Modifier [id number] - WC PHYS LEVEL 3 -  EST PT ICD-10 Diagnosis Description L97.212 Non-pressure chronic ulcer of right calf with fat layer exposed Quantity: 1 Electronic Signature(s) Signed: 02/11/2020 5:12:22 PM By: [name] RN, BSN Signed: 02/14/2020 4:57:23 PM By: Tobi Bastos MD, MBA Previous Signature: 02/11/2020 8:59:25 AM Version By: Tobi Bastos MD, MBA Entered By: [name] on 02/11/2020 10:21:41

## 2020-02-19 ENCOUNTER — Encounter (HOSPITAL_BASED_OUTPATIENT_CLINIC_OR_DEPARTMENT_OTHER): Payer: Medicare Other | Admitting: Internal Medicine

## 2020-02-21 NOTE — Progress Notes (Signed)
[name], [name] ([id number]) Visit Report for 02/11/2020 Arrival Information Details Patient Name: Date of Service: [name], [name] 02/11/2020 8:15 [name] Medical Record Number: [id number] Patient Account Number: [id number] Date of Birth/Sex: Treating RN: [birth year] (74 y.o. [name] Primary Care [name]: [name] Other Clinician: Referring Michael Willner: Treating [name]/Extender: [name] in Treatment: 14 Visit Information History Since Last Visit Added or deleted any medications: No Patient Arrived: Ambulatory Any new allergies or adverse reactions: No Arrival Time: 08:17 Had a fall or experienced change in No Accompanied By: self activities of daily living that may affect Transfer Assistance: None risk of falls: Patient Identification Verified: Yes Signs or symptoms of abuse/neglect since last visito No Secondary Verification Process Completed: Yes Hospitalized since last visit: No Patient Requires Transmission-Based Precautions: No Implantable device outside of the clinic excluding No Patient Has Alerts: Yes cellular tissue based products placed in the center Patient Alerts: non compressable since last visit: Has Dressing in Place as Prescribed: Yes Pain Present Now: No Electronic Signature(s) Signed: 02/21/2020 9:13:31 AM By: [name] Entered By: [name] on 02/11/2020 08:17:52 -------------------------------------------------------------------------------- Compression Therapy Details Patient Name: Date of Service: [name] 02/11/2020 8:15 [name] Medical Record Number: [id number] Patient Account Number: [id number] Date of Birth/Sex: Treating RN: [birth year] (74 y.o. [name] Primary Care Fleda Pagel: [name] Other Clinician: Referring Tanmay Halteman: Treating Yordin Rhoda/Extender: [name] in Treatment: 27 Compression Therapy Performed for Wound  Assessment: Wound #3 Right,Posterior Lower Leg Performed By: Clinician Michael Hurst, RN Compression Type: Rolena Infante Post Procedure Diagnosis Same as Pre-procedure Electronic Signature(s) Signed: 02/11/2020 5:12:22 PM By: Michael Hurst RN, BSN Entered By: [name] on 02/11/2020 08:57:15 -------------------------------------------------------------------------------- Encounter Discharge Information Details Patient Name: Date of Service: [name] 02/11/2020 8:15 [name] Medical Record Number: [id number] Patient Account Number: [id number] Date of Birth/Sex: Treating RN: June 14, 1946 (74 y.o. [name] Primary Care Giuliana Handyside: [name] Other Clinician: Referring Cheyan Frees: Treating Ryer Asato/Extender: [name] in Treatment: 89 Encounter Discharge Information Items Discharge Condition: Stable Ambulatory Status: Ambulatory Discharge Destination: Home Transportation: Private Auto Accompanied By: self Schedule Follow-up Appointment: Yes Clinical Summary of Care: Patient Declined Electronic Signature(s) Signed: 02/11/2020 4:34:07 PM By: Kela Millin Entered By: Kela Millin on 02/11/2020 09:14:20 -------------------------------------------------------------------------------- Lower Extremity Assessment Details Patient Name: Date of Service: [name] 02/11/2020 8:15 [name] Medical Record Number: [id number] Patient Account Number: [id number] Date of Birth/Sex: Treating RN: [birth year] (73 y.o. [name]) [name] Primary Care Moe Brier: [name] Other Clinician: Referring Cinque Begley: Treating Hephzibah Strehle/Extender: [name] in Treatment: 27 Edema Assessment Assessed: [Left: No] [Right: No] Edema: [Left: Ye] [Right: s] Calf Left: Right: Point of Measurement: 30 cm From Medial Instep cm 34 cm Ankle Left: Right: Point of Measurement: 10 cm From Medial Instep cm 22  cm Electronic Signature(s) Signed: 02/12/2020 5:17:39 PM By: Michael Coria RN Entered By: [name] on 02/11/2020 08:23:54 -------------------------------------------------------------------------------- [place] Details Patient Name: Date of Service: [name] 02/11/2020 8:15 [name] Medical Record Number: [id number] Patient Account Number: [id number] Date of Birth/Sex: Treating RN: [birth year] (74 y.o. [name] Primary Care Shameer Molstad: [name] Other Clinician: Referring Toure Edmonds: Treating Hajime Asfaw/Extender: [name] in Treatment: 27 Active Inactive Wound/Skin Impairment Nursing Diagnoses: Impaired tissue integrity Goals: Patient/caregiver [name] verbalize understanding of skin care regimen Date Initiated: 08/06/2019  Target Resolution Date: 02/22/2020 Goal Status: Active Ulcer/skin breakdown [name] have a volume reduction of 30% by week 4 Date Initiated: 08/06/2019 Date Inactivated: 09/03/2019 Target Resolution Date: 09/07/2019 Goal Status: Unmet Unmet Reason: PAD, necrotic surface Interventions: Assess patient/caregiver ability to obtain necessary supplies Assess patient/caregiver ability to perform ulcer/skin care regimen upon admission and as needed Assess ulceration(s) every visit Provide education on ulcer and skin care Notes: Electronic Signature(s) Signed: 02/11/2020 5:12:22 PM By: Michael Hurst RN, BSN Entered By: [name] on 02/11/2020 08:55:06 -------------------------------------------------------------------------------- Pain Assessment Details Patient Name: Date of Service: [name] 02/11/2020 8:15 [name] Medical Record Number: [id number] Patient Account Number: [id number] Date of Birth/Sex: Treating RN: 18-Jul-1946 (74 y.o. [name] Primary Care Aviyah Swetz: [name] Other Clinician: Referring Daemian Gahm: Treating Crystalle Popwell/Extender: [name] in Treatment: 27 Active Problems Location of Pain Severity and Description of Pain Patient Has Paino No Site Locations Pain Management and Medication Current Pain Management: Electronic Signature(s) Signed: 02/11/2020 5:12:22 PM By: Michael Hurst RN, BSN Signed: 02/21/2020 9:13:31 AM By: [name] Entered By: [name] on 02/11/2020 08:19:30 -------------------------------------------------------------------------------- Patient/Caregiver Education Details Patient Name: Date of Service: ICHAEL, PULLARA HA [name] 5/24/2021andnbsp8:15 [name] Medical Record Number: [id number] Patient Account Number: [id number] Date of Birth/Gender: Treating RN: 01-Dec-1945 (74 y.o. [name] Primary Care Physician: [name] Other Clinician: Referring Physician: Treating Physician/Extender: [name] in Treatment: 3 Education Assessment Education Provided To: Patient Education Topics Provided Wound/Skin Impairment: Methods: Explain/Verbal Responses: State content correctly [employer]) Signed: 02/11/2020 5:12:22 PM By: Michael Hurst RN, BSN Entered By: [name] on 02/11/2020 08:55:24 -------------------------------------------------------------------------------- Wound Assessment Details Patient Name: Date of Service: TELLY, JAWAD HA [name] 02/11/2020 8:15 [name] Medical Record Number: [id number] Patient Account Number: [id number] Date of Birth/Sex: Treating RN: [birth year] (73 y.o. [name]) [name] Primary Care Sosha Shepherd: [name] Other Clinician: Referring Nitika Jackowski: Treating Brigit Doke/Extender: [name] in Treatment: 27 Wound Status Wound Number: 10 Primary Skin Tear Etiology: Wound Location: Right Forearm Wound Open Wounding Event: Trauma Status: Date Acquired: 01/15/2020 Comorbid Cataracts, Anemia, Arrhythmia, Congestive Heart  Failure, Weeks Of Treatment: 2 History: Hypertension, Type II Diabetes, Gout, Neuropathy Clustered Wound: No Photos Photo Uploaded By: [name] on 02/12/2020 10:06:57 Wound Measurements Length: (cm) 1.3 Width: (cm) 2.2 Depth: (cm) 0.1 Area: (cm) 2.246 Volume: (cm) 0.225 % Reduction in Area: -36.2% % Reduction in Volume: -36.4% Epithelialization: None Tunneling: No Undermining: No Wound Description Classification: Full Thickness Without Exposed Support Struc Wound Margin: Distinct, outline attached Exudate Amount: Small Exudate Type: Serosanguineous Exudate Color: red, brown tures Foul Odor After Cleansing: No Slough/Fibrino Yes Wound Bed Granulation Amount: Small (1-33%) Exposed Structure Granulation Quality: Red, Pink Fascia Exposed: No Necrotic Amount: Large (67-100%) Fat Layer (Subcutaneous Tissue) Exposed: Yes Necrotic Quality: Adherent Slough Tendon Exposed: No Muscle Exposed: No Joint Exposed: No Bone Exposed: No Treatment Notes Wound #10 (Right Forearm) 1. Cleanse With Wound Cleanser 2. Periwound Care Skin Prep 3. Primary Dressing Applied Xeroform Gauze 4. Secondary Dressing Dry Gauze Roll Gauze 5. Secured With Tape Notes netting. Electronic Signature(s) Signed: 02/12/2020 5:17:39 PM By: Michael Coria RN Entered By: [name] on 02/11/2020 08:26:39 -------------------------------------------------------------------------------- Wound Assessment Details Patient Name: Date of Service: BEXLEY, MCLESTER [name] 02/11/2020 8:15 [name] Medical Record Number: [id number] Patient Account Number: [id number] Date of Birth/Sex: Treating RN: [birth year] (73 y.o. [name]) [name] Primary Care Neidra Girvan: [name] Other  Clinician: Referring Hunner Garcon: Treating Hollin Crewe/Extender: [name] in Treatment: 27 Wound Status Wound Number: 3 Primary Venous Leg Ulcer Etiology: Wound Location: Right, Posterior Lower Leg Wound  Open Wounding Event: Trauma Status: Date Acquired: 06/06/2019 Comorbid Cataracts, Anemia, Arrhythmia, Congestive Heart Failure, Weeks Of Treatment: 27 History: Hypertension, Type II Diabetes, Gout, Neuropathy Clustered Wound: No Photos Photo Uploaded By: [name] on 02/12/2020 10:26:19 Wound Measurements Length: (cm) 4.5 Width: (cm) 1.6 Depth: (cm) 0.1 Area: (cm) 5.655 Volume: (cm) 0.565 % Reduction in Area: 74.9% % Reduction in Volume: 91.6% Epithelialization: Large (67-100%) Tunneling: No Undermining: No Wound Description Classification: Full Thickness Without Exposed Support Structures Wound Margin: Flat and Intact Exudate Amount: Medium Exudate Type: Serosanguineous Exudate Color: red, brown Foul Odor After Cleansing: No Slough/Fibrino No Wound Bed Granulation Amount: Large (67-100%) Exposed Structure Granulation Quality: Red Fascia Exposed: No Necrotic Amount: None Present (0%) Fat Layer (Subcutaneous Tissue) Exposed: Yes Tendon Exposed: No Muscle Exposed: No Joint Exposed: No Bone Exposed: No Treatment Notes Wound #3 (Right, Posterior Lower Leg) 1. Cleanse With Wound Cleanser Soap and water 2. Periwound Care Moisturizing lotion 3. Primary Dressing Applied Hydrofera Blue 4. Secondary Dressing ABD Pad Foam 6. Support Layer [employer] Notes HB classic with normal saline. foam applied for protection at achilles area per patient. Electronic Signature(s) Signed: 02/12/2020 5:17:39 PM By: Michael Coria RN Entered By: [name] on 02/11/2020 08:27:07 -------------------------------------------------------------------------------- Wound Assessment Details Patient Name: Date of Service: SEFERINO, OSCAR HA [name] 02/11/2020 8:15 [name] Medical Record Number: [id number] Patient Account Number: [id number] Date of Birth/Sex: Treating RN: 10/13/1945 (73 y.o. [name]) [name] Primary Care Ashey Tramontana: [name] Other Clinician: Referring  Fareedah Mahler: Treating Amandine Covino/Extender: [name] in Treatment: 27 Wound Status Wound Number: 5 Primary Diabetic Wound/Ulcer of the Lower Extremity Etiology: Wound Location: Right T Fifth oe Wound Open Wounding Event: Blister Status: Date Acquired: 09/19/2019 Comorbid Cataracts, Anemia, Arrhythmia, Congestive Heart Failure, Weeks Of Treatment: 20 History: Hypertension, Type II Diabetes, Gout, Neuropathy Clustered Wound: No Photos Photo Uploaded By: [name] on 02/12/2020 10:25:56 Wound Measurements Length: (cm) Width: (cm) Depth: (cm) Area: (cm) Volume: (cm) 0 % Reduction in Area: 100% 0 % Reduction in Volume: 100% 0 Epithelialization: None 0 Tunneling: No 0 Undermining: No Wound Description Classification: Grade 2 Wound Margin: Flat and Intact Exudate Amount: None Present Foul Odor After Cleansing: No Slough/Fibrino No Wound Bed Granulation Amount: None Present (0%) Exposed Structure Necrotic Amount: None Present (0%) Fascia Exposed: No Fat Layer (Subcutaneous Tissue) Exposed: No Tendon Exposed: No Muscle Exposed: No Joint Exposed: No Bone Exposed: No Electronic Signature(s) Signed: 02/12/2020 5:17:39 PM By: Michael Coria RN Entered By: [name] on 02/11/2020 08:27:26 -------------------------------------------------------------------------------- [place] Details Patient Name: Date of Service: [name] HA [name] 02/11/2020 8:15 [name] Medical Record Number: [id number] Patient Account Number: [id number] Date of Birth/Sex: Treating RN: 02-18-1946 (74 y.o. [name] Primary Care Jaymien Landin: [name] Other Clinician: Referring Nichlas Pitera: Treating Jennah Satchell/Extender: [name] in Treatment: 27 Vital Signs Time Taken: 08:19 Temperature (F): 98.6 Height (in): 67 Pulse (bpm): 53 Weight (lbs): 190 Respiratory Rate (breaths/min): 20 Body Mass Index (BMI): 29.8 Blood  Pressure (mmHg): 147/53 Reference Range: 80 - 120 mg / dl Electronic Signature(s) Signed: 02/21/2020 9:13:31 AM By: [name] Entered By: [name] on 02/11/2020 08:19:21

## 2020-02-25 ENCOUNTER — Encounter (HOSPITAL_BASED_OUTPATIENT_CLINIC_OR_DEPARTMENT_OTHER): Payer: Medicare Other | Attending: Internal Medicine | Admitting: Internal Medicine

## 2020-02-25 ENCOUNTER — Ambulatory Visit (HOSPITAL_COMMUNITY)
Admission: RE | Admit: 2020-02-25 | Discharge: 2020-02-25 | Disposition: A | Discharge status: Home / Self Care | Payer: Medicare Other | Source: Ambulatory Visit | Attending: Nephrology | Admitting: Nephrology

## 2020-02-25 DIAGNOSIS — S8011XD Contusion of right lower leg, subsequent encounter: Secondary | ICD-10-CM | POA: Insufficient documentation

## 2020-02-25 DIAGNOSIS — L97212 Non-pressure chronic ulcer of right calf with fat layer exposed: Secondary | ICD-10-CM | POA: Diagnosis not present

## 2020-02-25 DIAGNOSIS — I5032 Chronic diastolic (congestive) heart failure: Secondary | ICD-10-CM | POA: Insufficient documentation

## 2020-02-25 DIAGNOSIS — L97219 Non-pressure chronic ulcer of right calf with unspecified severity: Secondary | ICD-10-CM | POA: Diagnosis present

## 2020-02-25 DIAGNOSIS — E1122 Type 2 diabetes mellitus with diabetic chronic kidney disease: Secondary | ICD-10-CM | POA: Insufficient documentation

## 2020-02-25 DIAGNOSIS — L97518 Non-pressure chronic ulcer of other part of right foot with other specified severity: Secondary | ICD-10-CM | POA: Insufficient documentation

## 2020-02-25 DIAGNOSIS — I4891 Unspecified atrial fibrillation: Secondary | ICD-10-CM | POA: Insufficient documentation

## 2020-02-25 DIAGNOSIS — S41111D Laceration without foreign body of right upper arm, subsequent encounter: Secondary | ICD-10-CM | POA: Insufficient documentation

## 2020-02-25 DIAGNOSIS — Z940 Kidney transplant status: Secondary | ICD-10-CM | POA: Diagnosis not present

## 2020-02-25 DIAGNOSIS — D631 Anemia in chronic kidney disease: Secondary | ICD-10-CM | POA: Diagnosis not present

## 2020-02-25 DIAGNOSIS — E1140 Type 2 diabetes mellitus with diabetic neuropathy, unspecified: Secondary | ICD-10-CM | POA: Diagnosis not present

## 2020-02-25 DIAGNOSIS — N189 Chronic kidney disease, unspecified: Secondary | ICD-10-CM | POA: Insufficient documentation

## 2020-02-25 DIAGNOSIS — L97811 Non-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: Secondary | ICD-10-CM | POA: Diagnosis not present

## 2020-02-25 DIAGNOSIS — E11622 Type 2 diabetes mellitus with other skin ulcer: Secondary | ICD-10-CM | POA: Diagnosis not present

## 2020-02-25 DIAGNOSIS — I2510 Atherosclerotic heart disease of native coronary artery without angina pectoris: Secondary | ICD-10-CM | POA: Diagnosis not present

## 2020-02-25 DIAGNOSIS — W2203XD Walked into furniture, subsequent encounter: Secondary | ICD-10-CM | POA: Insufficient documentation

## 2020-02-25 DIAGNOSIS — I130 Hypertensive heart and chronic kidney disease with heart failure and stage 1 through stage 4 chronic kidney disease, or unspecified chronic kidney disease: Secondary | ICD-10-CM | POA: Insufficient documentation

## 2020-02-25 DIAGNOSIS — W228XXD Striking against or struck by other objects, subsequent encounter: Secondary | ICD-10-CM | POA: Insufficient documentation

## 2020-02-25 DIAGNOSIS — I87321 Chronic venous hypertension (idiopathic) with inflammation of right lower extremity: Secondary | ICD-10-CM | POA: Insufficient documentation

## 2020-02-25 DIAGNOSIS — M109 Gout, unspecified: Secondary | ICD-10-CM | POA: Insufficient documentation

## 2020-02-25 LAB — HEMOGLOBIN AND HEMATOCRIT, BLOOD
HCT: 32.2 % — ABNORMAL LOW (ref 39.0–52.0)
Hemoglobin: 9.4 g/dL — ABNORMAL LOW (ref 13.0–17.0)

## 2020-02-25 MED ORDER — DARBEPOETIN ALFA 200 MCG/0.4ML IJ SOSY
200.0000 ug | PREFILLED_SYRINGE | INTRAMUSCULAR | Status: DC
  Administered 2020-02-25: 200 ug via SUBCUTANEOUS
  Filled 2020-02-25: qty 0.4

## 2020-02-25 NOTE — Discharge Instructions (Signed)
Darbepoetin Alfa injection What is this medicine? DARBEPOETIN ALFA (dar be POE e tin AL fa) helps your body make more red blood cells. It is used to treat anemia caused by chronic kidney failure and chemotherapy. This medicine may be used for other purposes; ask your health care provider or pharmacist if you have questions. COMMON BRAND NAME(S): Aranesp What should I tell my health care provider before I take this medicine? They need to know if you have any of these conditions:  blood clotting disorders or history of blood clots  cancer patient not on chemotherapy  cystic fibrosis  heart disease, such as angina, heart failure, or a history of a heart attack  hemoglobin level of 12 g/dL or greater  high blood pressure  low levels of folate, iron, or vitamin B12  seizures  an unusual or allergic reaction to darbepoetin, erythropoietin, albumin, hamster proteins, latex, other medicines, foods, dyes, or preservatives  pregnant or trying to get pregnant  breast-feeding How should I use this medicine? This medicine is for injection into a vein or under the skin. It is usually given by a health care professional in a hospital or clinic setting. If you get this medicine at home, you will be taught how to prepare and give this medicine. Use exactly as directed. Take your medicine at regular intervals. Do not take your medicine more often than directed. It is important that you put your used needles and syringes in a special sharps container. Do not put them in a trash can. If you do not have a sharps container, call your pharmacist or healthcare provider to get one. A special MedGuide will be given to you by the pharmacist with each prescription and refill. Be sure to read this information carefully each time. Talk to your pediatrician regarding the use of this medicine in children. While this medicine may be used in children as young as 1 month of age for selected conditions, precautions do  apply. Overdosage: If you think you have taken too much of this medicine contact a poison control center or emergency room at once. NOTE: This medicine is only for you. Do not share this medicine with others. What if I miss a dose? If you miss a dose, take it as soon as you can. If it is almost time for your next dose, take only that dose. Do not take double or extra doses. What may interact with this medicine? Do not take this medicine with any of the following medications:  epoetin alfa This list may not describe all possible interactions. Give your health care provider a list of all the medicines, herbs, non-prescription drugs, or dietary supplements you use. Also tell them if you smoke, drink alcohol, or use illegal drugs. Some items may interact with your medicine. What should I watch for while using this medicine? Your condition will be monitored carefully while you are receiving this medicine. You may need blood work done while you are taking this medicine. This medicine may cause a decrease in vitamin B6. You should make sure that you get enough vitamin B6 while you are taking this medicine. Discuss the foods you eat and the vitamins you take with your health care professional. What side effects may I notice from receiving this medicine? Side effects that you should report to your doctor or health care professional as soon as possible:  allergic reactions like skin rash, itching or hives, swelling of the face, lips, or tongue  breathing problems  changes in   vision  chest pain  confusion, trouble speaking or understanding  feeling faint or lightheaded, falls  high blood pressure  muscle aches or pains  pain, swelling, warmth in the leg  rapid weight gain  severe headaches  sudden numbness or weakness of the face, arm or leg  trouble walking, dizziness, loss of balance or coordination  seizures (convulsions)  swelling of the ankles, feet, hands  unusually weak or  tired Side effects that usually do not require medical attention (report to your doctor or health care professional if they continue or are bothersome):  diarrhea  fever, chills (flu-like symptoms)  headaches  nausea, vomiting  redness, stinging, or swelling at site where injected This list may not describe all possible side effects. Call your doctor for medical advice about side effects. You may report side effects to FDA at 1-800-FDA-1088. Where should I keep my medicine? Keep out of the reach of children. Store in a refrigerator between 2 and 8 degrees C (36 and 46 degrees F). Do not freeze. Do not shake. Throw away any unused portion if using a single-dose vial. Throw away any unused medicine after the expiration date. NOTE: This sheet is a summary. It may not cover all possible information. If you have questions about this medicine, talk to your doctor, pharmacist, or health care provider.  2020 Elsevier/Gold Standard (2017-09-21 16:44:20)  

## 2020-02-25 NOTE — Progress Notes (Signed)
PATIENT CARE CENTER NOTE  Diagnosis:Anemia associated with Chronic Renal Failure   Provider:Coladonato, Broadus John, MD   Procedure:Aranesp injections   Note:Patient received sub-q Aranesp injectionin left arm. Tolerated injection well. No adverse reaction noted. Pre-injection Hemoglobin9.4and BP 152/71. Discharge paperwork given to patient. Alert, oriented and ambulatory at discharge.

## 2020-02-25 NOTE — Progress Notes (Signed)
[name], [name] ([id number]) Visit Report for 02/25/2020 Arrival Information Details Patient Name: Date of Service: [name], [name] 02/25/2020 8:15 A M Medical Record Number: [id number] Patient Account Number: [id number] Date of Birth/Sex: Treating RN: 14-Oct-1945 (74 y.o. [name] Primary Care Hershey Knauer: [name] Other Clinician: Referring Devonna Oboyle: Treating Bellagrace Sylvan/Extender: [name] in Treatment: 29 Visit Information History Since Last Visit Added or deleted any medications: No Patient Arrived: Ambulatory Any new allergies or adverse reactions: No Arrival Time: 08:14 Had a fall or experienced change in No Accompanied By: self activities of daily living that may affect Transfer Assistance: None risk of falls: Patient Identification Verified: Yes Signs or symptoms of abuse/neglect since last visito No Secondary Verification Process Completed: Yes Hospitalized since last visit: No Patient Requires Transmission-Based Precautions: No Implantable device outside of the clinic excluding No Patient Has Alerts: Yes cellular tissue based products placed in the center Patient Alerts: non compressable since last visit: Has Dressing in Place as Prescribed: Yes Pain Present Now: No Electronic Signature(s) Signed: 02/25/2020 1:20:36 PM By: [name] Entered By: [name] on 02/25/2020 08:15:02 -------------------------------------------------------------------------------- Compression Therapy Details Patient Name: Date of Service: [name], [name] HA M 02/25/2020 8:15 A M Medical Record Number: [id number] Patient Account Number: [id number] Date of Birth/Sex: Treating RN: [birth year] (74 y.o. [name] Primary Care Konnar Ben: [name] Other Clinician: Referring Leilah Polimeni: Treating Desree Leap/Extender: [name] in Treatment: 29 Compression Therapy Performed for Wound  Assessment: Wound #3 Right,Posterior Lower Leg Performed By: Clinician [name], RN Compression Type: Rolena Infante Post Procedure Diagnosis Same as Pre-procedure Electronic Signature(s) Signed: 02/25/2020 6:28:13 PM By: [name] RN, BSN Entered By: [name] on 02/25/2020 08:33:45 -------------------------------------------------------------------------------- Encounter Discharge Information Details Patient Name: Date of Service: [name], [name] HA M 02/25/2020 8:15 A M Medical Record Number: [id number] Patient Account Number: [id number] Date of Birth/Sex: Treating RN: [birth year] (74 y.o. [name] Primary Care Katonya Blecher: [name] Other Clinician: Referring Elisheva Fallas: Treating Kayson Tasker/Extender: [name] in Treatment: 29 Encounter Discharge Information Items Discharge Condition: Stable Ambulatory Status: Ambulatory Discharge Destination: Home Transportation: Private Auto Accompanied By: self Schedule Follow-up Appointment: Yes Clinical Summary of Care: Patient Declined Notes BP 169/70. case manager and MD made aware. Electronic Signature(s) Signed: 02/25/2020 5:09:44 PM By: [name] Entered By: [name] on 02/25/2020 08:44:57 -------------------------------------------------------------------------------- Lower Extremity Assessment Details Patient Name: Date of Service: [name], [name] 02/25/2020 8:15 A M Medical Record Number: [id number] Patient Account Number: [id number] Date of Birth/Sex: Treating RN: Apr 28, 1946 (74 y.o. [name] Primary Care Jaidence Geisler: [name] Other Clinician: Referring Shawndell Varas: Treating Joella Saefong/Extender: [name] in Treatment: 29 Edema Assessment Assessed: Shirlyn Goltz: No] Patrice Paradise: Yes] Edema: [Left: Ye] [Right: s] Calf Left: Right: Point of Measurement: 30 cm From Medial Instep cm 34 cm Ankle Left: Right: Point of  Measurement: 10 cm From Medial Instep cm 23 cm Vascular Assessment Pulses: Dorsalis Pedis Palpable: [Right:Yes] Electronic Signature(s) Signed: 02/25/2020 5:09:44 PM By: [name] Entered By: [name] on 02/25/2020 08:21:52 -------------------------------------------------------------------------------- Multi Wound Chart Details Patient Name: Date of Service: [name], [name] HA M 02/25/2020 8:15 A M Medical Record Number: [id number] Patient Account Number: [id number] Date of Birth/Sex: Treating RN: [birth year] (74 y.o. [name] Primary Care Janifer Gieselman: [name] Other Clinician: Referring Aidyn Sportsman: Treating Nidia Grogan/Extender: [name] in Treatment: 29 Vital Signs Height(in): 67 Pulse(bpm): 53  Weight(lbs): 190 Blood Pressure(mmHg): 196/90 Body Mass Index(BMI): 30 Temperature(F): 98.6 Respiratory Rate(breaths/min): 20 Photos: [10:No Photos Right Forearm] [3:No Photos Right, Posterior Lower Leg] [N/A:N/A N/A] Wound Location: [10:Trauma] [3:Trauma] [N/A:N/A] Wounding Event: [10:Skin Tear] [3:Venous Leg Ulcer] [N/A:N/A] Primary Etiology: [10:N/A] [3:Cataracts, Anemia, Arrhythmia,] [N/A:N/A] Comorbid History: [10:01/15/2020] [3:Congestive Heart Failure, Hypertension, Type II Diabetes, Gout, Neuropathy 06/06/2019] [N/A:N/A] Date Acquired: [10:4] [3:29] [N/A:N/A] Weeks of Treatment: [10:Healed - Epithelialized] [3:Open] [N/A:N/A] Wound Status: [10:0x0x0] [3:2.1x1.2x0.1] [N/A:N/A] Measurements L x W x D (cm) [10:0] [3:1.979] [N/A:N/A] A (cm) : rea [10:0] [3:0.198] [N/A:N/A] Volume (cm) : [10:100.00%] [3:91.20%] [N/A:N/A] % Reduction in Area: [10:100.00%] [3:97.10%] [N/A:N/A] % Reduction in Volume: [10:Full Thickness Without Exposed] [3:Full Thickness Without Exposed] [N/A:N/A] Classification: [10:Support Structures N/A] [3:Support Structures Medium] [N/A:N/A] Exudate A mount: [10:N/A] [3:Serosanguineous] [N/A:N/A] Exudate  Type: [10:N/A] [3:red, brown] [N/A:N/A] Exudate Color: [10:N/A] [3:Flat and Intact] [N/A:N/A] Wound Margin: [10:N/A] [3:Large (67-100%)] [N/A:N/A] Granulation A mount: [10:N/A] [3:Red] [N/A:N/A] Granulation Quality: [10:N/A] [3:None Present (0%)] [N/A:N/A] Necrotic A mount: [10:N/A] [3:Large (67-100%)] [N/A:N/A] Epithelialization: [10:N/A] [3:Compression Therapy] [N/A:N/A] Treatment Notes Wound #3 (Right, Posterior Lower Leg) 1. Cleanse With Wound Cleanser Soap and water 2. Periwound Care Barrier cream Moisturizing lotion 3. Primary Dressing Applied Hydrofera Blue 4. Secondary Dressing ABD Pad Dry Gauze Foam 6. Support Layer [employer] Services Notes HB classic with normal saline. foam applied for protection at achilles area per patient. Electronic Signature(s) Signed: 02/25/2020 6:28:13 PM By: [name] RN, BSN Signed: 02/25/2020 8:16:21 PM By: [name] MD Entered By: [name] on 02/25/2020 08:49:17 -------------------------------------------------------------------------------- Multi-Disciplinary Care Plan Details Patient Name: Date of Service: [name], [name] HA M 02/25/2020 8:15 A M Medical Record Number: [id number] Patient Account Number: [id number] Date of Birth/Sex: Treating RN: [birth year] (74 y.o. [name] Primary Care Deedee Lybarger: [name] Other Clinician: Referring Ranita Stjulien: Treating Yomar Mejorado/Extender: [name] in Treatment: 29 Active Inactive Wound/Skin Impairment Nursing Diagnoses: Impaired tissue integrity Goals: Patient/caregiver will verbalize understanding of skin care regimen Date Initiated: 08/06/2019 Target Resolution Date: 03/21/2020 Goal Status: Active Ulcer/skin breakdown will have a volume reduction of 30% by week 4 Date Initiated: 08/06/2019 Date Inactivated: 09/03/2019 Target Resolution Date: 09/07/2019 Goal Status: Unmet Unmet Reason: PAD, necrotic  surface Interventions: Assess patient/caregiver ability to obtain necessary supplies Assess patient/caregiver ability to perform ulcer/skin care regimen upon admission and as needed Assess ulceration(s) every visit Provide education on ulcer and skin care Notes: Electronic Signature(s) Signed: 02/25/2020 6:28:13 PM By: [name] RN, BSN Entered By: [name] on 02/25/2020 08:29:47 -------------------------------------------------------------------------------- Pain Assessment Details Patient Name: Date of Service: [name], [name] HA M 02/25/2020 8:15 A M Medical Record Number: [id number] Patient Account Number: [id number] Date of Birth/Sex: Treating RN: 1946-03-15 (74 y.o. [name] Primary Care Maylynn Orzechowski: [name] Other Clinician: Referring Nicoletta Hush: Treating Joeseph Verville/Extender: [name] in Treatment: 29 Active Problems Location of Pain Severity and Description of Pain Patient Has Paino No Site Locations Pain Management and Medication Current Pain Management: Electronic Signature(s) Signed: 02/25/2020 1:20:36 PM By: [name] Signed: 02/25/2020 6:28:13 PM By: [name] RN, BSN Entered By: [name] on 02/25/2020 08:15:30 -------------------------------------------------------------------------------- Patient/Caregiver Education Details Patient Name: Date of Service: [name] 6/7/2021andnbsp8:15 A M Medical Record Number: [id number] Patient Account Number: [id number] Date of Birth/Gender: Treating RN: 1946/01/29 (74 y.o. [name] Primary Care Physician: [name] Other Clinician: Referring Physician: Treating Physician/Extender: [name] in Treatment: 29 Education Assessment Education  Provided To: Patient Education Topics Provided Wound/Skin Impairment: Methods: Explain/Verbal Responses: State content correctly Electronic  Signature(s) Signed: 02/25/2020 6:28:13 PM By: [name] RN, BSN Entered By: [name] on 02/25/2020 08:29:57 -------------------------------------------------------------------------------- Wound Assessment Details Patient Name: Date of Service: [name], [name] HA M 02/25/2020 8:15 A M Medical Record Number: [id number] Patient Account Number: [id number] Date of Birth/Sex: Treating RN: 1946/08/07 (74 y.o. [name], Meta.Reding Primary Care Dakoda Bassette: [name] Other Clinician: Referring Jenessa Gillingham: Treating Deklan Minar/Extender: [name] in Treatment: 29 Wound Status Wound Number: 10 Primary Etiology: Skin Tear Wound Location: Right Forearm Wound Status: Healed - Epithelialized Wounding Event: Trauma Date Acquired: 01/15/2020 Weeks Of Treatment: 4 Clustered Wound: No Wound Measurements Length: (cm) Width: (cm) Depth: (cm) Area: (cm) Volume: (cm) 0 % Reduction in Area: 100% 0 % Reduction in Volume: 100% 0 0 0 Wound Description Classification: Full Thickness Without Exposed Support Structur es Electronic Signature(s) Signed: 02/25/2020 5:09:44 PM By: [name] Entered By: [name] on 02/25/2020 08:22:10 -------------------------------------------------------------------------------- Wound Assessment Details Patient Name: Date of Service: [name], [name] 02/25/2020 8:15 A M Medical Record Number: [id number] Patient Account Number: [id number] Date of Birth/Sex: Treating RN: 10-19-1945 (74 y.o. [name], Meta.Reding Primary Care Jajaira Ruis: [name] Other Clinician: Referring Kimbra Marcelino: Treating Jennalee Greaves/Extender: [name] in Treatment: 29 Wound Status Wound Number: 3 Primary Venous Leg Ulcer Etiology: Wound Location: Right, Posterior Lower Leg Wound Open Wounding Event: Trauma Status: Date Acquired: 06/06/2019 Comorbid Cataracts, Anemia, Arrhythmia, Congestive Heart  Failure, Weeks Of Treatment: 29 History: Hypertension, Type II Diabetes, Gout, Neuropathy Clustered Wound: No Wound Measurements Length: (cm) 2.1 Width: (cm) 1.2 Depth: (cm) 0.1 Area: (cm) 1.979 Volume: (cm) 0.198 % Reduction in Area: 91.2% % Reduction in Volume: 97.1% Epithelialization: Large (67-100%) Tunneling: No Undermining: No Wound Description Classification: Full Thickness Without Exposed Support Structures Wound Margin: Flat and Intact Exudate Amount: Medium Exudate Type: Serosanguineous Exudate Color: red, brown Foul Odor After Cleansing: No Slough/Fibrino No Wound Bed Granulation Amount: Large (67-100%) Exposed Structure Granulation Quality: Red Fascia Exposed: No Necrotic Amount: None Present (0%) Fat Layer (Subcutaneous Tissue) Exposed: Yes Tendon Exposed: No Muscle Exposed: No Joint Exposed: No Bone Exposed: No Treatment Notes Wound #3 (Right, Posterior Lower Leg) 1. Cleanse With Wound Cleanser Soap and water 2. Periwound Care Barrier cream Moisturizing lotion 3. Primary Dressing Applied Hydrofera Blue 4. Secondary Dressing ABD Pad Dry Gauze Foam 6. Support Layer Kelly Services Notes HB classic with normal saline. foam applied for protection at achilles area per patient. Electronic Signature(s) Signed: 02/25/2020 5:09:44 PM By: [name] Entered By: [name] on 02/25/2020 08:22:59 -------------------------------------------------------------------------------- Vitals Details Patient Name: Date of Service: [name], [name] HA M 02/25/2020 8:15 A M Medical Record Number: [id number] Patient Account Number: [id number] Date of Birth/Sex: Treating RN: 01/12/1946 (74 y.o. [name] Primary Care Armon Orvis: [name] Other Clinician: Referring Barnett Elzey: Treating Cashae Weich/Extender: [name] in Treatment: 29 Vital Signs Time Taken: 08:15 Temperature (F): 98.6 Height (in): 67 Pulse  (bpm): 53 Weight (lbs): 190 Respiratory Rate (breaths/min): 20 Body Mass Index (BMI): 29.8 Blood Pressure (mmHg): 196/90 Reference Range: 80 - 120 mg / dl Electronic Signature(s) Signed: 02/25/2020 1:20:36 PM By: [name] Entered By: [name] on 02/25/2020 08:15:24

## 2020-02-25 NOTE — Progress Notes (Signed)
[name], [name] ([id number]) Visit Report for 02/25/2020 HPI Details Patient Name: Date of Service: [name], [name] 02/25/2020 8:15 A M Medical Record Number: [id number] Patient Account Number: [id number] Date of Birth/Sex: Treating RN: Aug 08, 1946 (74 y.o. [name] Primary Care Provider: [name] Other Clinician: Referring Provider: Treating Provider/Extender: [name] in Treatment: 29 History of Present Illness HPI Description: [name] HPI Description: 74 year old gentleman who was recently seen by his nephrologist [name], and noted to have a wound on his left lower extremity which was lacerated 2 months ago and now has reopened. The patient's left shin has a ulceration with some exudate but no evidence of infection and he was referred to [place] for further care as it was known that the patient has had some peripheral vascular disease in the past. Past medical history significant for chronic kidney disease, atrial fibrillation, diabetes mellitus,status post kidney transplant in 1983 and 2005, a week fistula graft placement, status post previous bowel surgery. 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 10/06/2015 -- x-ray of the left tibia and fibula shows no evidence of osteomyelitis. The patient has also had Doppler studies of his extremity and is awaiting the appointment with the vascular surgeon. We have not yet received these reports. 10/13/2015 -- lower extremity venous duplex reflux evaluation shows reflux in the left common femoral vein, left saphenofemoral junction and the proximal greater saphenous vein extending to the proximal calf. There is also reflux in the left proximal to mid small saphenous vein. Arterial duplex studies done showed the resting ABI was not applicable due to tibial artery medial calcification. 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 The right TBI was 1 which is normal and the left ABI was 1 which is normal. Patient has otherwise been doing fine and has been compliant with his dressings. 10/20/2015 -- He was seen by [name] recently for a vascular opinion on 10/15/2015. His left lower extremity venous insufficiency duplex study revealed GSV reflux,SS vein reflux and deep venous reflux in the common femoral vein. His ABIs were non compressible and his TBI on the right was 1.01 and on the left was 0.80. He was asked to continue with the wound care with compressive therapy followed by EVLA of the left GS vein 3 months. He recommended 20-30 mm thigh- high compression stockings and the need for a three-month trial of this. The patient had an Unna boot applied at the vascular office but he could not tolerate this with a lot of pain and issues with his toes and hence came here on Friday for removal of this and we reapplied a 2 layer compression. 11/10/2015 -- patient still has not purchased his 20-30 mm thigh-high compression stockings as prescribed by [name]. Readmission: 08/08/18 on evaluation today patient presents for readmission concerning a new injury to the left anterior lower extremity. He was previously seen in 2017 here in our clinic. He states that he has done fairly well since that point. Nonetheless he is having at this time some pain but states that he hit this on a table that fell over and actually struck his leg. This appears to have pulled back some of his skin which folded in on itself and is causing some difficulty as far as that is concerned. There does  not appear to be any evidence of infection at this time. No fevers, chills, nausea, or vomiting noted at this time. He's been using dressings on his own currently without complication. 08/15/18 on evaluation today patient actually appears to be doing  somewhat better in regard to his wound of the lower [name] when compared to the first visit last week. I had to do a much more extensive debridement at that time it does appear that I'm gonna have to perform some debridement today but it does not look to be as extensive by any means. Nonetheless fortunately he does not show any signs of infection he does have discomfort at this site. I believe based on what I'm seeing currently he may benefit from Iodoflex to help keep the wound bed clean. Patient tolerated therapy without complication. Upon evaluation today the patient actually appears to be doing excellent in regard to his left lower extremity ulcer. This is much better than the previous two visits where he had a lot of necrotic tissue around the edge of the wound simply due to the fact that again there was a significant skin tear where the edge had been cleared away prior to reattaching and being able to heal appropriately. He seems to be doing much better at this point. 08/28/18 on evaluation today the patient's wound actually does appear to be showing signs of improvement. With that being said though he is improving he would likely note even greater improvement if we were able to sharply debride the wound. Nonetheless this caused him to much discomfort he tells me. 09/04/18 on evaluation today patient actually appears to be showing signs of improvement in regard to his left lower extremity ulcer. He has been tolerating the dressing changes including the wrap although he tells me at this point that the burning does last for a couple of days even with just the Iodoflex. I was afraid that this may been part of the issue that he was having with discomfort. It does seem to be the case. Nonetheless he shows no signs of evidence of infection at this time which is good news. No fevers chills noted ADMISSION to [name] 10/05/2018 This is a patient who was cared for in 2017 and again  in the fall of this year at our sister clinic and [place]. He actually lives in [place] in [place]. We have been dealing with an apparent traumatic area on the left anterior tibial area. This is been present for the last several months. He was supposed to be using Iodoflex Kerlix and Coban however he was hospitalized from 09/05/2018 through 09/11/2018 with delirium secondary to pneumonia. Since then he is only been putting Vaseline gauze on this without compression. He also has a more recent skin tear on the dorsal right hand that may have only happened in the last week. The patient had arterial studies done in 2017 in January which was 3 years ago. At that point he had noncompressible ABIs but really quite good TBI's both normal. Triphasic waveforms on the right monophasic at the left posterior tibial but triphasic at the left dorsalis pedis. His ABIs in our clinic today were both noncompressible 1/23; the patient has wounds on the right dorsal hand just distal to the wrist and on the left anterior lower extremity. Both of these look very healthy he is using Hydrofera Blue 1/30; left anterior lower extremity wound much smaller. Healthy looking surface. The laceration area just distal to the wrist on the dorsal  hand on the right is also just about closed I used Hydrofera Blue here 2/6; left anterior lower extremity wound is much smaller but still open. The laceration area just distal to the wrist on the dorsal hand is fully epithelialized. 2/13; the patient's anterior lower extremity wound is closed. The laceration just distal to the wrist on the dorsal hand is also fully epithelialized and closed. The patient has external compression stockings which I think are 20/30 READMISSION 08/06/2019 [name] is a 74 year old man with had several times previously in our clinic. He is a diabetic with a history of chronic renal insufficiency status post kidney transplant in 1983 and again in 2005. He  was then in 2017 with a laceration on the left lower extremity. He was worked up at the time with arterial studies and reflux studies. The arterial studies showed ABIs to be noncompressible but TBI's were within normal limits. I do not have the reflux studies at the moment. He was also sent here in 2019 with a left lower extremity wound and then again in 2020 with left lower extremity trauma a skin tear on the wrist. He was discharged with 20/30 stockings identified from myself that that might not be enough compression. Nevertheless he states he was wearing these fairly reliably. In September he had a fall with a substantial bruise in the area of the wound. He says he saw orthopedics and they told him there was some muscle strain sometime it later this opened into a wound. He has a fairly substantial wound on the right posterior calf. Satellite areas around this including medially and posteriorly. He has not worn his stockings since the injury Past medical history; includes chronic renal failure secondary to diabetes with kidney transplant x2, atrial fibrillation, heart failure with preserved ejection fraction, coronary artery disease. ABIs on the right in our clinic were once again noncompressible 08/13/2019 on evaluation today patient appears to be doing decently well with regard to his wound compared to last week's evaluation. Unfortunately he is still having a lot of discomfort at this point which is I think in some part due to the 3 layer compression wrap which is a little bit stronger I think for him. When he was here before we actually utilized a Kerlix and Coban wrap which he states seemed to got a little bit better. Nonetheless I think we can probably drop back to this in light of the discomfort that he had. Nonetheless the pain was not really right around the wound itself as much as it was around the ankle in particular. The Iodoflex does seem to have done well for him As the wound is  appearing somewhat better today which is excellent news. 11/30; still complaining of a lot of pain. Apparently arterial studies I ordered 2 weeks ago are below. I do not believe we have an appointment with vein and vascular as of yet; ABI Findings: +---------+------------------+-----+----------+--------+ Right Rt Pressure (mmHg)IndexWaveform Comment  +---------+------------------+-----+----------+--------+ PTA >254 1.50 monophasic  +---------+------------------+-----+----------+--------+ DP >254 1.50 monophasic  +---------+------------------+-----+----------+--------+ Great T oe68 0.40 Abnormal   +---------+------------------+-----+----------+--------+ +---------+------------------+-----+----------+-------+ Left Lt Pressure (mmHg)IndexWaveform Comment +---------+------------------+-----+----------+-------+ Brachial 169     +---------+------------------+-----+----------+-------+ PTA >254 1.50 monophasic  +---------+------------------+-----+----------+-------+ DP >254 1.50 monophasic  +---------+------------------+-----+----------+-------+ Great T oe40 0.24 Abnormal   +---------+------------------+-----+----------+-------+ Pedal arteries appear hyperemic. Patient refused Brachial pressure in the right arm. Summary: Right: Resting right ankle-brachial index indicates noncompressible right lower extremity arteries. The right toe-brachial index is abnormal. Left: Resting left ankle-brachial index indicates noncompressible left lower  extremity arteries. The left toe-brachial index is abnormal. He is also having considerably more swelling in his left calf. This was not there when I last saw him 2 weeks ago. He tells me that some of the home health compression wraps have been slipping down and that may be the issue here however a month I am uncertain 81/8; sees vascular on December 22. Still complaining of a lot of pain. DVT study I did last  time was negative for DVT 12/14; still complaining of pain which if this is arterial is certainly claudication and rest keeps him uncomfortable at night. He has an appointment with vein and vascular on December 22. Wound surface is better using Iodoflex. Once the surface of this is satisfactory and we have exhausted the vascular route. Perhaps an advanced treatment option. He has a configuration of the venous ulceration although his arterial studies are not very good. The other issue is the patient has a transplanted kidney. This will make angiography difficult and challenging issue 12/21; still complaining of pain and drainage. We are using Iodoflex on the wound under compression. He sees Dr. Donnetta Hutching tomorrow to evaluate his noninvasive studies noted above. He has a transplanted kidney further complicating the options for angiography. 12/28; still using Iodoflex under compression. I have Dr. Luther Parody note from 12/22. he noted arterial studies revealing monophasic waveforms at the pedal vessels bilaterally and calcified vessels making the ABI unreliable. He did not comment on the reduced TBI's. He felt these were venous wounds based on the palpable dorsalis pedis pulse. 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 And they arranged for formal venous duplex with reflux studies in the next several weeks. Follow-up with either Scot Dock or Dr. Oneida Alar 09/24/2019 still using Iodoflex under compression. He has an appointment with Dr. Doren Custard on 1/7 with regards to his venous disease. The patient was not felt to have a primary arterial problem for the nonhealing of his wound. We did attempt to wrap him with 3 layer when he first came into the clinic he complained of a lot of pain in the ankle although this may have been because the dressing fell down somewhat. He has far too edema fluid in the right leg for a good prognosis about healing this wound He comes in today with an excoriation on the bottom part of his  right fifth toe. He thinks he may have done this taking off his clothes and that something got caught on the toe. There is no open wound however the toe itself is very painful 1/11; we are using Iodoflex under compression. The wound bed is clean. He went on to see Dr. Doren Custard on 09/27/2019 he again feels that the patient's arterial [name] is adequate. He feels that he might benefit from right greater saphenous vein ablation for a venous ulcer. In the meantime the area that he was complaining about last week on the right fifth toe. An x-ray that I ordered showed marked soft tissue swelling along the distal aspect of the right foot but there was no evidence of osteomyelitis. He comes in today with the fifth toenail just about coming off. He has black eschar underneath the toe on the plantar aspect. The toe was swollen red and very painful. In the right setting this could be a significant soft tissue infection versus ischemia of the toe itself. He did not show this to Dr. Doren Custard 1/18; I am using Iodoflex under compression a large wound on the right posterior calf. He had his  right greater saphenous vein ablation by Dr. Doren Custard although I am not sure that is the only problem here. The right fifth toe which was possibly trauma 2 weeks ago continues to be exceptionally painful with a necrotic tip. Maybe not quite as swollen. I started him empirically on Augmentin 5/125 1 p.o. twice daily last week after discussing this with Dr. Loletha Grayer of nephrology. Perhaps somewhat better this week but not as good as I was hoping. A plain x-ray was negative. He comes in today with an area on the medial right calf that was blistered and now open. In looking at things he appears to be systemically fluid volume overloaded. He has a transplanted kidney. He states he takes his Lasix variably when he has appointments he tries not to take it the later I am really not certain if he takes this reliably. However he has far too much edema fluid  in the right leg to easily heal this wound and he appears to be developing blisters medially to form additional wounds 1/25; his CO2 angiogram was done by Dr. Doren Custard last week. This showed the proximal arteries all to be patent. On the right side the common femoral deep femoral superficial femoral popliteal anterior tibial and peroneal arteries were patent. The posterior tibial was occluded but reconstituted distally via collaterals from the peroneal artery he was felt he should have enough blood flow to heal his wounds including the right fifth toe. The right fifth toe looks better however he is still complaining of a lot of pain. The large area which is a venous ulceration posteriorly has a better looking surface I think we can switch to Hydrofera Blue today 2/1; the patient's original wounds on the right posterior medial calf has come down in width however superior to this he has new denuded areas and I am concerned we simply do not have enough edema control. He has already undergone a right greater saphenous ablation. He had a CO2 angiogram done by Dr. Doren Custard and the comment was that we should have enough blood flow to heal the venous wound however we simply do not seem to have enough edema control with 3 layer compression. The patient is status post kidney transplant although his exact renal function is not really clear. Nor am i sure what dose of diuretic he is supposed to be on. He also has the area on the right fifth toe which was unclear etiology but I think became secondarily infected I gave him 2 weeks of antibiotics for this and this seems to have settled down he still has a black eschar over the tip of the toe. X-ray was negative for fracture. He says he has a history of gout. 2/8; the patient's wound on the right posterior calf is about the same. The superficial area medially also about the same. He got a prescription for prednisoneo Gout after he developed erythema on the dorsal aspect of  his left great toe going along with the right fifth toe which has been problematic all along. He has not taken it because he is concerned about increasing CBGs. He has a transplanted kidney is already on prednisone 5 mg. He would not be a good candidate for NSAIDs. Perhaps colchicine. He is not aware of what his uric acid level is 2/15; his right posterior calf wound seems to be coming in in terms of width. Everything here looks fairly good. No mechanical debridement we have been using Hydrofera Blue. He has had an ablation by vein  and vascular. Felt to have adequate arterial [name] to this area. The patient got prednisone last week from Dr. Angelique Holm of nephrology. At my urging he actually took it. The area on his left toe was a lot better. The right toe was not as painful but still erythematous with a wound at the tip. We have been using silver alginate here 2/22; right posterior calf seems to be gradually epithelializing. Still a fairly substantial wound. He still has an area on the tip of his right fifth toe which I think was gout related. This is gradually improving. We have been using Unna boots to wrap X-ray I did of the foot last time was again negative there was soft tissue irregularity about the distal fifth digit but no radiographic evidence of bone damage 3/1; right posterior calf seems to be gradually improving however there were areas of hyper granulation. We have been using Hydrofera Blue. The hyper granulation is mostly evident in the most superior finger shape projection of the wound itself. The area on the right fifth toe also was slough covered and required debridement. 3/8 continued improvement in the right posterior calf and the tip of the right fifth toe. We have been using Hydrofera Blue under compression he is changing the area to the toe 3/22; continued improvement in the right posterior calf. Unfortunately comes in today with a new skin tear in the mid anterior tibia area  this probably had something to do with changing his dressing home health I called and left this a message last week. 3/29; continued improvement in the a substantial wound on the right posterior calf. The skin tear anteriorly from last week is already healed on the tip of the right fifth toe there is still a nonviable area. 4/5; we have continued contraction of the substantial wound on the right posterior calf. We continue to have problems with the tip of the right fifth toe. We have been using Hydrofera Blue to both wound areas He is complaining about some discomfort in the Achilles area. T me he had surgery for what sounds like a torn Achilles about 10 years ago. ells 4/12; we have continued contraction of the substantial wound on the right posterior calf. Still an area on the tip of the right fifth toe. I changed him to silver collagen on the toe last week but I think home health continue to use Hydrofera Blue to both wound areas. 4/19; we continue to have contraction of the substantial wound on the right posterior calf however there continues to be hyper granulation. The area on the tip of the fifth toe is very small still some debris on the surface we have been using silver collagen 4/26; both wounds have contracted. I was hoping the fifth toe wound would close over but with probing there is still an open small hole here. 5/3; his wound on the right posterior calf which was his major area continues to contract looks healthy we have been using Hydrofera Blue. Using silver collagen to the fifth toe, not making a lot of progress here 5/10; right posterior calf wound continues to be smaller in terms of surface area and look healthy we have been using Hydrofera Blue here. The tip of the right fifth toe is still open requiring debridement. He has a new laceration on the right forearm he says he hit this on a microwave 6 days ago 02/11/20-The right fifth toe wound is closed, the right posterior calf  wound looks healthy with smaller dimensions  compared with last time, the right forearm area of laceration has a small blister which I try to open up with scalpel with very little fluid 6/7 the right fifth toe looks fine. Area on the right forearm is also closed. Right posterior calf wound continues to contract. Much smaller area remaining with healthy surface. We are using Hydrofera Blue under compression Electronic Signature(s) Signed: 02/25/2020 8:16:21 PM By: [name] MD Entered By: [name] on 02/25/2020 08:50:10 -------------------------------------------------------------------------------- Physical Exam Details Patient Name: Date of Service: [name], [name] HA M 02/25/2020 8:15 A M Medical Record Number: [id number] Patient Account Number: [id number] Date of Birth/Sex: Treating RN: [birth year] (74 y.o. [name] Primary Care Provider: [name] Other Clinician: Referring Provider: Treating Provider/Extender: [name] in Treatment: 29 Constitutional Patient is hypertensive.. Pulse regular and within target range for patient.Marland Kitchen Respirations regular, non-labored and within target range.. Temperature is normal and within the target range for the patient.Marland Kitchen Appears in no distress. Cardiovascular Pedal pulses absent bilaterally.. We have good edema control. Musculoskeletal Right fifth toe looks fine there is no open wound here.. Integumentary (Hair, Skin) There is nothing open on either forearm. He is very fragile skin secondary to prolonged use of antirejection agents for his kidney transplant. Notes Wound exam; right posterior calf wound. The wound looks healthy. Debrided with wound cleanser and gauze. No mechanical debridement is necessary. There is no surrounding erythema Right fifth toe wound is closed Electronic Signature(s) Signed: 02/25/2020 8:16:21 PM By: [name] MD Entered By: [name] on 02/25/2020  08:52:50 -------------------------------------------------------------------------------- Physician Orders Details Patient Name: Date of Service: [name], [name] HA M 02/25/2020 8:15 A M Medical Record Number: [id number] Patient Account Number: [id number] Date of Birth/Sex: Treating RN: Jul 12, 1946 (74 y.o. [name] Primary Care Provider: [name] Other Clinician: Referring Provider: Treating Provider/Extender: [name] in Treatment: 53 Verbal / Phone Orders: No Diagnosis Coding ICD-10 Coding Code Description S80.11XD Contusion of right lower leg, subsequent encounter L97.212 Non-pressure chronic ulcer of right calf with fat layer exposed I87.321 Chronic venous hypertension (idiopathic) with inflammation of right lower extremity E11.622 Type 2 diabetes mellitus with other skin ulcer L97.518 Non-pressure chronic ulcer of other part of right foot with other specified severity S41.111D Laceration without foreign body of right upper arm, subsequent encounter Follow-up Appointments ppointment in 2 weeks. - Monday Return A Nurse Visit: - 1 week for rewrap Dressing Change Frequency Wound #3 Right,Posterior Lower Leg Other: - twice a week (once by wound clinic, once by home health) Skin Barriers/Peri-Wound Care Wound #3 Right,Posterior Lower Leg Barrier cream Moisturizing lotion - to leg Wound Cleansing Clean wound with Normal Saline. - or normal saline on days that dressing is changed May shower with protection. Primary Wound Dressing Wound #3 Right,Posterior Lower Leg Hydrofera Blue - Classic Secondary Dressing Wound #3 Right,Posterior Lower Leg Foam - foam top bend of foot for protection Dry Gauze ABD pad Edema Control Unna Boot to Right Lower Extremity - *****ONLY ONE SINGLE LAYER OF UNNA BOOT***** - foam to achilles to pad Avoid standing for long periods of time Elevate legs to the level of the heart or above for 30  minutes daily and/or when sitting, a frequency of: - throughout the day Exercise regularly Additional Orders / Instructions Other: - Go to ER if toe/leg gets more red/black, increased pain, fever [name] skilled nursing for wound care. - [facility] Electronic Signature(s) Signed: 02/25/2020 6:28:13 PM  By: [name] RN, BSN Signed: 02/25/2020 8:16:21 PM By: [name] MD Entered By: [name] on 02/25/2020 08:32:49 -------------------------------------------------------------------------------- Problem List Details Patient Name: Date of Service: [name], [name] HA M 02/25/2020 8:15 A M Medical Record Number: [id number] Patient Account Number: [id number] Date of Birth/Sex: Treating RN: 1945-10-02 (74 y.o. 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 08/06/2019 No Yes extremity E11.622 Type 2 diabetes mellitus with other skin ulcer 08/06/2019 No Yes Inactive Problems ICD-10 Code Description Active Date Inactive Date L97.811 Non-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 12/10/2019 12/10/2019 S41.111D Laceration without foreign body of right upper arm, subsequent encounter 01/28/2020 01/28/2020 L97.518 Non-pressure chronic ulcer of other part of right foot with other specified severity 10/01/2019 10/01/2019 Resolved Problems Electronic Signature(s) Signed: 02/25/2020 8:16:21 PM By: [name] MD Entered By: [name] on 02/25/2020  08:49:06 -------------------------------------------------------------------------------- Progress Note Details Patient Name: Date of Service: [name], [name] HA M 02/25/2020 8:15 A M Medical Record Number: [id number] Patient Account Number: [id number] Date of Birth/Sex: Treating RN: [birth year] (74 y.o. [name] Primary Care Provider: [name] Other Clinician: Referring Provider: Treating Provider/Extender: [name] in Treatment: 29 Subjective History of Present Illness (HPI) [name] HPI Description: 74 year old gentleman who was recently seen by his nephrologist [name], and noted to have a wound on his left lower extremity which was lacerated 2 months ago and now has reopened. The patient's left shin has a ulceration with some exudate but no evidence of infection and he was referred to [place] for further care as it was known that the patient has had some peripheral vascular disease in the past. Past medical history significant for chronic kidney disease, atrial fibrillation, diabetes mellitus,status post kidney transplant in 1983 and 2005, a week fistula graft placement, status post previous bowel surgery. 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 10/06/2015 -- x-ray of the left tibia and fibula shows no evidence of osteomyelitis. The patient has also had Doppler studies of his extremity and is awaiting the appointment with the vascular surgeon. We have not yet received these reports. 10/13/2015 -- lower extremity venous duplex reflux evaluation shows reflux in the left common femoral vein, left saphenofemoral junction and the proximal greater saphenous vein extending to the proximal calf. There is also reflux in the left proximal to mid small saphenous vein. Arterial duplex studies done showed the resting ABI was not applicable due to tibial artery medial calcification. 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 The right TBI was 1 which is normal and the left ABI was 1 which is normal. Patient has otherwise been doing fine and has been compliant with his dressings. 10/20/2015 -- He was seen by [name] recently for a vascular opinion on 10/15/2015. His left lower extremity venous insufficiency duplex study revealed GSV reflux,SS vein reflux and deep venous reflux in the common femoral vein. His ABIs were non  compressible and his TBI on the right was 1.01 and on the left was 0.80. He was asked to continue with the wound care with compressive therapy followed by EVLA of the left GS vein 3 months. He recommended 20-30 mm thigh- high compression stockings and the need for a three-month trial of this. The patient had an Unna boot applied at the vascular office but he could not tolerate this with a lot of pain and issues with his toes and hence came here on Friday for removal of this and we reapplied a 2 layer compression. 11/10/2015 -- patient still has not purchased his 20-30 mm thigh-high compression stockings as prescribed by [name]. Readmission: 08/08/18 on evaluation today patient presents for readmission concerning a new injury to the left anterior lower extremity. He was previously seen in 2017 here in our clinic. He states that he has done fairly well since that point. Nonetheless he is having at this time some pain but states that he hit this on a table that fell over and actually struck his leg. This appears to have pulled back some of his skin which folded in on itself and is causing some difficulty as far as that is concerned. There does not appear to be any evidence of infection at this time. No fevers, chills, nausea, or vomiting noted at this time. He's been using dressings on his own currently without complication. 08/15/18 on evaluation today  patient actually appears to be doing somewhat better in regard to his wound of the lower [name] when compared to the first visit last week. I had to do a much more extensive debridement at that time it does appear that I'm gonna have to perform some debridement today but it does not look to be as extensive by any means. Nonetheless fortunately he does not show any signs of infection he does have discomfort at this site. I believe based on what I'm seeing currently he may benefit from Iodoflex to help keep the wound bed clean. Patient tolerated therapy without complication. Upon evaluation today the patient actually appears to be doing excellent in regard to his left lower extremity ulcer. This is much better than the previous two visits where he had a lot of necrotic tissue around the edge of the wound simply due to the fact that again there was a significant skin tear where the edge had been cleared away prior to reattaching and being able to heal appropriately. He seems to be doing much better at this point. 08/28/18 on evaluation today the patient's wound actually does appear to be showing signs of improvement. With that being said though he is improving he would likely note even greater improvement if we were able to sharply debride the wound. Nonetheless this caused him to much discomfort he tells me. 09/04/18 on evaluation today patient actually appears to be showing signs of improvement in regard to his left lower extremity ulcer. He has been tolerating the dressing changes including the wrap although he tells me at this point that the burning does last for a couple of days even with just the Iodoflex. I was afraid that this may been part of the issue that he was having with discomfort. It does seem to be the case. Nonetheless he shows no signs of evidence of infection at this time which is good news. No fevers chills noted AD[facility]ION [name] 10[facility]05/2018 This is a patient  who was cared for in 2017 and again in the  fall of this year at our sister clinic and [facility]. He actually lives in [place] in [place]. We have been dealing with an apparent traumatic area on the left anterior tibial area. This is been present for the last several months. He was supposed to be using Iodoflex Kerlix and Coban however he was hospitalized from 09/05/2018 through 09/11/2018 with delirium secondary to pneumonia. Since then he is only been putting Vaseline gauze on this without compression. He also has a more recent skin tear on the dorsal right hand that may have only happened in the last week. The patient had arterial studies done in 2017 in January which was 3 years ago. At that point he had noncompressible ABIs but really quite good TBI's both normal. Triphasic waveforms on the right monophasic at the left posterior tibial but triphasic at the left dorsalis pedis. His ABIs in our clinic today were both noncompressible 1/23; the patient has wounds on the right dorsal hand just distal to the wrist and on the left anterior lower extremity. Both of these look very healthy he is using Hydrofera Blue 1/30; left anterior lower extremity wound much smaller. Healthy looking surface. The laceration area just distal to the wrist on the dorsal hand on the right is also just about closed I used Hydrofera Blue here 2/6; left anterior lower extremity wound is much smaller but still open. The laceration area just distal to the wrist on the dorsal hand is fully epithelialized. 2/13; the patient's anterior lower extremity wound is closed. The laceration just distal to the wrist on the dorsal hand is also fully epithelialized and closed. The patient has external compression stockings which I think are 20/30 READMISSION 08/06/2019 [name] is a 74 year old man with had several times previously in our clinic. He is a diabetic with a history of chronic renal insufficiency status post  kidney transplant in 1983 and again in 2005. He was then in 2017 with a laceration on the left lower extremity. He was worked up at the time with arterial studies and reflux studies. The arterial studies showed ABIs to be noncompressible but TBI's were within normal limits. I do not have the reflux studies at the moment. He was also sent here in 2019 with a left lower extremity wound and then again in 2020 with left lower extremity trauma a skin tear on the wrist. He was discharged with 20/30 stockings identified from myself that that might not be enough compression. Nevertheless he states he was wearing these fairly reliably. In September he had a fall with a substantial bruise in the area of the wound. He says he saw orthopedics and they told him there was some muscle strain sometime it later this opened into a wound. He has a fairly substantial wound on the right posterior calf. Satellite areas around this including medially and posteriorly. He has not worn his stockings since the injury Past medical history; includes chronic renal failure secondary to diabetes with kidney transplant x2, atrial fibrillation, heart failure with preserved ejection fraction, coronary artery disease. ABIs on the right in our clinic were once again noncompressible 08/13/2019 on evaluation today patient appears to be doing decently well with regard to his wound compared to last week's evaluation. Unfortunately he is still having a lot of discomfort at this point which is I think in some part due to the 3 layer compression wrap which is a little bit stronger I think for him. When he was here before we actually utilized a Kerlix and  Coban wrap which he states seemed to got a little bit better. Nonetheless I think we can probably drop back to this in light of the discomfort that he had. Nonetheless the pain was not really right around the wound itself as much as it was around the ankle in particular. The Iodoflex does seem  to have done well for him As the wound is appearing somewhat better today which is excellent news. 11/30; still complaining of a lot of pain. Apparently arterial studies I ordered 2 weeks ago are below. I do not believe we have an appointment with vein and vascular as of yet; ABI Findings: +---------+------------------+-----+----------+--------+ Right Rt Pressure (mmHg)IndexWaveform Comment  +---------+------------------+-----+----------+--------+ PTA >254 1.50 monophasic  +---------+------------------+-----+----------+--------+ DP >254 1.50 monophasic  +---------+------------------+-----+----------+--------+ Great T oe68 0.40 Abnormal   +---------+------------------+-----+----------+--------+ +---------+------------------+-----+----------+-------+ Left Lt Pressure (mmHg)IndexWaveform Comment +---------+------------------+-----+----------+-------+ Brachial 169     +---------+------------------+-----+----------+-------+ PTA >254 1.50 monophasic  +---------+------------------+-----+----------+-------+ DP >254 1.50 monophasic  +---------+------------------+-----+----------+-------+ Great T oe40 0.24 Abnormal   +---------+------------------+-----+----------+-------+ Pedal arteries appear hyperemic. Patient refused Brachial pressure in the right arm. Summary: Right: Resting right ankle-brachial index indicates noncompressible right lower extremity arteries. The right toe-brachial index is abnormal. Left: Resting left ankle-brachial index indicates noncompressible left lower extremity arteries. The left toe-brachial index is abnormal. He is also having considerably more swelling in his left calf. This was not there when I last saw him 2 weeks ago. He tells me that some of the home health compression wraps have been slipping down and that may be the issue here however a month I am uncertain 87/8; sees vascular on December 22. Still  complaining of a lot of pain. DVT study I did last time was negative for DVT 12/14; still complaining of pain which if this is arterial is certainly claudication and rest keeps him uncomfortable at night. He has an appointment with vein and vascular on December 22. Wound surface is better using Iodoflex. Once the surface of this is satisfactory and we have exhausted the vascular route. Perhaps an advanced treatment option. He has a configuration of the venous ulceration although his arterial studies are not very good. The other issue is the patient has a transplanted kidney. This will make angiography difficult and challenging issue 12/21; still complaining of pain and drainage. We are using Iodoflex on the wound under compression. He sees Dr. Donnetta Hutching tomorrow to evaluate his noninvasive studies noted above. He has a transplanted kidney further complicating the options for angiography. 12/28; still using Iodoflex under compression. I have Dr. Luther Parody note from 12/22. he noted arterial studies revealing monophasic waveforms at the pedal vessels bilaterally and calcified vessels making the ABI unreliable. He did not comment on the reduced TBI's. He felt these were venous wounds based on the palpable dorsalis pedis pulse. 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 And they arranged for formal venous duplex with reflux studies in the next several weeks. Follow-up with either Scot Dock or Dr. Oneida Alar 09/24/2019 still using Iodoflex under compression. He has an appointment with Dr. Doren Custard on 1/7 with regards to his venous disease. The patient was not felt to have a primary arterial problem for the nonhealing of his wound. We did attempt to wrap him with 3 layer when he first came into the clinic he complained of a lot of pain in the ankle although this may have been because the dressing fell down somewhat. He has far too edema fluid in the right leg for a good prognosis about healing this wound He comes  in  today with an excoriation on the bottom part of his right fifth toe. He thinks he may have done this taking off his clothes and that something got caught on the toe. There is no open wound however the toe itself is very painful 1/11; we are using Iodoflex under compression. The wound bed is clean. He went on to see Dr. Doren Custard on 09/27/2019 he again feels that the patient's arterial [name] is adequate. He feels that he might benefit from right greater saphenous vein ablation for a venous ulcer. In the meantime the area that he was complaining about last week on the right fifth toe. An x-ray that I ordered showed marked soft tissue swelling along the distal aspect of the right foot but there was no evidence of osteomyelitis. He comes in today with the fifth toenail just about coming off. He has black eschar underneath the toe on the plantar aspect. The toe was swollen red and very painful. In the right setting this could be a significant soft tissue infection versus ischemia of the toe itself. He did not show this to Dr. Doren Custard 1/18; I am using Iodoflex under compression a large wound on the right posterior calf. He had his right greater saphenous vein ablation by Dr. Doren Custard although I am not sure that is the only problem here. ooThe right fifth toe which was possibly trauma 2 weeks ago continues to be exceptionally painful with a necrotic tip. Maybe not quite as swollen. I started him empirically on Augmentin 5/125 1 p.o. twice daily last week after discussing this with Dr. Loletha Grayer of nephrology. Perhaps somewhat better this week but not as good as I was hoping. A plain x-ray was negative. He comes in today with an area on the medial right calf that was blistered and now open. In looking at things he appears to be systemically fluid volume overloaded. He has a transplanted kidney. He states he takes his Lasix variably when he has appointments he tries not to take it the later I am really not certain if he takes  this reliably. However he has far too much edema fluid in the right leg to easily heal this wound and he appears to be developing blisters medially to form additional wounds 1/25; his CO2 angiogram was done by Dr. Doren Custard last week. This showed the proximal arteries all to be patent. On the right side the common femoral deep femoral superficial femoral popliteal anterior tibial and peroneal arteries were patent. The posterior tibial was occluded but reconstituted distally via collaterals from the peroneal artery he was felt he should have enough blood flow to heal his wounds including the right fifth toe. The right fifth toe looks better however he is still complaining of a lot of pain. The large area which is a venous ulceration posteriorly has a better looking surface I think we can switch to Hydrofera Blue today 2/1; the patient's original wounds on the right posterior medial calf has come down in width however superior to this he has new denuded areas and I am concerned we simply do not have enough edema control. He has already undergone a right greater saphenous ablation. He had a CO2 angiogram done by Dr. Doren Custard and the comment was that we should have enough blood flow to heal the venous wound however we simply do not seem to have enough edema control with 3 layer compression. The patient is status post kidney transplant although his exact renal function is not really clear. Nor  am i sure what dose of diuretic he is supposed to be on. He also has the area on the right fifth toe which was unclear etiology but I think became secondarily infected I gave him 2 weeks of antibiotics for this and this seems to have settled down he still has a black eschar over the tip of the toe. X-ray was negative for fracture. He says he has a history of gout. 2/8; the patient's wound on the right posterior calf is about the same. The superficial area medially also about the same. He got a prescription for prednisoneo  Gout after he developed erythema on the dorsal aspect of his left great toe going along with the right fifth toe which has been problematic all along. He has not taken it because he is concerned about increasing CBGs. He has a transplanted kidney is already on prednisone 5 mg. He would not be a good candidate for NSAIDs. Perhaps colchicine. He is not aware of what his uric acid level is 2/15; his right posterior calf wound seems to be coming in in terms of width. Everything here looks fairly good. No mechanical debridement we have been using Hydrofera Blue. He has had an ablation by vein and vascular. Felt to have adequate arterial [name] to this area. The patient got prednisone last week from Dr. Angelique Holm of nephrology. At my urging he actually took it. The area on his left toe was a lot better. The right toe was not as painful but still erythematous with a wound at the tip. We have been using silver alginate here 2/22; right posterior calf seems to be gradually epithelializing. Still a fairly substantial wound. He still has an area on the tip of his right fifth toe which I think was gout related. This is gradually improving. We have been using Unna boots to wrap X-ray I did of the foot last time was again negative there was soft tissue irregularity about the distal fifth digit but no radiographic evidence of bone damage 3/1; right posterior calf seems to be gradually improving however there were areas of hyper granulation. We have been using Hydrofera Blue. The hyper granulation is mostly evident in the most superior finger shape projection of the wound itself. ooThe area on the right fifth toe also was slough covered and required debridement. 3/8 continued improvement in the right posterior calf and the tip of the right fifth toe. We have been using Hydrofera Blue under compression he is changing the area to the toe 3/22; continued improvement in the right posterior calf. Unfortunately comes in  today with a new skin tear in the mid anterior tibia area this probably had something to do with changing his dressing home health I called and left this a message last week. 3/29; continued improvement in the a substantial wound on the right posterior calf. The skin tear anteriorly from last week is already healed on the tip of the right fifth toe there is still a nonviable area. 4/5; we have continued contraction of the substantial wound on the right posterior calf. We continue to have problems with the tip of the right fifth toe. We have been using Hydrofera Blue to both wound areas He is complaining about some discomfort in the Achilles area. T me he had surgery for what sounds like a torn Achilles about 10 years ago. ells 4/12; we have continued contraction of the substantial wound on the right posterior calf. ooStill an area on the tip of the right fifth  toe. I changed him to silver collagen on the toe last week but I think home health continue to use Hydrofera Blue to both wound areas. 4/19; we continue to have contraction of the substantial wound on the right posterior calf however there continues to be hyper granulation. ooThe area on the tip of the fifth toe is very small still some debris on the surface we have been using silver collagen 4/26; both wounds have contracted. I was hoping the fifth toe wound would close over but with probing there is still an open small hole here. 5/3; his wound on the right posterior calf which was his major area continues to contract looks healthy we have been using Hydrofera Blue. Using silver collagen to the fifth toe, not making a lot of progress here 5/10; right posterior calf wound continues to be smaller in terms of surface area and look healthy we have been using Hydrofera Blue here. The tip of the right fifth toe is still open requiring debridement. He has a new laceration on the right forearm he says he hit this on a microwave 6 days  ago 02/11/20-The right fifth toe wound is closed, the right posterior calf wound looks healthy with smaller dimensions compared with last time, the right forearm area of laceration has a small blister which I try to open up with scalpel with very little fluid 6/7 the right fifth toe looks fine. Area on the right forearm is also closed. Right posterior calf wound continues to contract. Much smaller area remaining with healthy surface. We are using Hydrofera Blue under compression Objective Constitutional Patient is hypertensive.. Pulse regular and within target range for patient.Marland Kitchen Respirations regular, non-labored and within target range.. Temperature is normal and within the target range for the patient.Marland Kitchen Appears in no distress. Vitals Time Taken: 8:15 AM, Height: 67 in, Weight: 190 lbs, BMI: 29.8, Temperature: 98.6 F, Pulse: 53 bpm, Respiratory Rate: 20 breaths/min, Blood Pressure: 196/90 mmHg. Cardiovascular Pedal pulses absent bilaterally.. We have good edema control. Musculoskeletal Right fifth toe looks fine there is no open wound here.. General Notes: Wound exam; right posterior calf wound. The wound looks healthy. Debrided with wound cleanser and gauze. No mechanical debridement is necessary. There is no surrounding erythema ooRight fifth toe wound is closed Integumentary (Hair, Skin) There is nothing open on either forearm. He is very fragile skin secondary to prolonged use of antirejection agents for his kidney transplant. Wound #10 status is Healed - Epithelialized. Original cause of wound was Trauma. The wound is located on the Right Forearm. The wound measures 0cm length x 0cm width x 0cm depth; 0cm^2 area and 0cm^3 volume. Wound #3 status is Open. Original cause of wound was Trauma. The wound is located on the Right,Posterior Lower Leg. The wound measures 2.1cm length x 1.2cm width x 0.1cm depth; 1.979cm^2 area and 0.198cm^3 volume. There is Fat Layer (Subcutaneous Tissue)  Exposed exposed. There is no tunneling or undermining noted. There is a medium amount of serosanguineous drainage noted. The wound margin is flat and intact. There is large (67-100%) red granulation within the wound bed. There is no necrotic tissue within the wound bed. 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 There  was a Haematologist Compression Therapy Procedure by [name], RN. Post procedure Diagnosis Wound #3: Same as Pre-Procedure Plan Follow-up Appointments: Return Appointment in 2 weeks. - Monday Nurse Visit: - 1 week for rewrap Dressing Change Frequency: Wound #3 Right,Posterior Lower Leg: Other: - twice a week (once by wound clinic, once by home health) Skin Barriers/Peri-Wound Care: Wound #3 Right,Posterior Lower Leg: Barrier cream Moisturizing lotion - to leg Wound Cleansing: Clean wound with Normal Saline. - or normal saline on days that dressing is changed May shower with protection. Primary Wound Dressing: Wound #3 Right,Posterior Lower Leg: Hydrofera Blue - Classic Secondary Dressing: Wound #3 Right,Posterior Lower Leg: Foam - foam top bend of foot for protection Dry Gauze ABD pad Edema Control: Unna Boot to Right Lower Extremity - *****ONLY ONE SINGLE LAYER OF UNNA BOOT***** - foam to achilles to pad Avoid standing for long periods of time Elevate legs to the level of the heart or above for 30 minutes daily and/or when sitting, a frequency of: - throughout the day Exercise regularly Additional Orders / Instructions: Other: - Go to ER if toe/leg gets more red/black, increased pain, fever Home  Health: [facility] for wound care. - [facility] 1. Continue with Hydrofera Blue under an Haematologist. 2. His right fifth toe looks fine no palpable tenderness no joint pain is evident 3. Skin on both arms is healed although he is going to have problems with this going forward secondary to the frailty of the skin in his arms Electronic Signature(s) Signed: 02/25/2020 8:16:21 PM By: [name] MD Entered By: [name] on 02/25/2020 08:53:47 -------------------------------------------------------------------------------- SuperBill Details Patient Name: Date of Service: [name], [name] HA M 02/25/2020 Medical Record Number: [id number] Patient Account Number: [id number] Date of Birth/Sex: Treating RN: August 14, 1946 (74 y.o. 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 E11.622 Type 2 diabetes mellitus with other skin ulcer L97.518 Non-pressure chronic ulcer of other part of right foot with other specified severity S41.111D Laceration without foreign body of right upper arm, subsequent encounter Facility Procedures CPT4 Code: [id number] Description: (Facility Use Only) [phone] - APPLY Louretta Parma BOOT RT Modifier: Quantity: 1 Physician Procedures : CPT4 Code Description Modifier [id number] - WC PHYS LEVEL 3 - EST PT ICD-10 Diagnosis Description S80.11XD Contusion of right lower leg, subsequent encounter L97.212 Non-pressure chronic ulcer of right calf with fat layer exposed I87.321 Chronic  venous hypertension (idiopathic) with inflammation of right lower  extremity E11.622 Type 2 diabetes mellitus with other skin ulcer Quantity: 1 Electronic Signature(s) Signed: 02/25/2020 8:16:21 PM By: [name] MD Entered By: [name] on 02/25/2020 08:54:11

## 2020-03-03 ENCOUNTER — Other Ambulatory Visit: Payer: Self-pay

## 2020-03-03 ENCOUNTER — Encounter (HOSPITAL_BASED_OUTPATIENT_CLINIC_OR_DEPARTMENT_OTHER): Payer: Medicare Other | Admitting: Physician Assistant

## 2020-03-03 DIAGNOSIS — I87321 Chronic venous hypertension (idiopathic) with inflammation of right lower extremity: Secondary | ICD-10-CM | POA: Diagnosis not present

## 2020-03-03 NOTE — Progress Notes (Signed)
Michael, [name] ([id number]) Visit Report for 03/03/2020 Arrival Information Details Patient Name: Date of Service: [name], [name] 03/03/2020 9:00 A M Medical Record Number: [id number] Patient Account Number: [id number] Date of Birth/Sex: Treating RN: 10-05-1945 (74 y.o. [name] Primary Care Sherea Liptak: [name] Other Clinician: Referring Essa Malachi: Treating Grey Schlauch/Extender: Jaci Lazier in Treatment: 48 Visit Information History Since Last Visit Added or deleted any medications: No Patient Arrived: Ambulatory Any new allergies or adverse reactions: No Arrival Time: 09:13 Had a fall or experienced change in No Accompanied By: self activities of daily living that may affect Transfer Assistance: None risk of falls: Patient Identification Verified: Yes Signs or symptoms of abuse/neglect since last visito No Secondary Verification Process Completed: Yes Hospitalized since last visit: No Patient Requires Transmission-Based Precautions: No Implantable device outside of the clinic excluding No Patient Has Alerts: Yes cellular tissue based products placed in the center Patient Alerts: non compressable since last visit: Has Dressing in Place as Prescribed: Yes Has Compression in Place as Prescribed: Yes Pain Present Now: No Electronic Signature(s) Signed: 03/03/2020 5:38:02 PM By: [name] RN, BSN Entered By: [name] on 03/03/2020 09:39:46 -------------------------------------------------------------------------------- Compression Therapy Details Patient Name: Date of Service: Michael, [name] HA M 03/03/2020 9:00 A M Medical Record Number: [id number] Patient Account Number: [id number] Date of Birth/Sex: Treating RN: June 01, 1946 (74 y.o. [name] Primary Care Tearah Saulsbury: [name] Other Clinician: Referring Rohith Fauth: Treating Krishon Adkison/Extender: Jaci Lazier in  Treatment: 30 Compression Therapy Performed for Wound Assessment: Wound #3 Right,Posterior Lower Leg Performed By: Clinician Kela Millin, RN Compression Type: Rolena Infante Electronic Signature(s) Signed: 03/03/2020 5:37:40 PM By: Kela Millin Entered By: Kela Millin on 03/03/2020 09:26:41 -------------------------------------------------------------------------------- Encounter Discharge Information Details Patient Name: Date of Service: [name], [name] HA M 03/03/2020 9:00 A M Medical Record Number: [id number] Patient Account Number: [id number] Date of Birth/Sex: Treating RN: Feb 17, 1946 (74 y.o. [name] Primary Care Andreina Outten: [name] Other Clinician: Referring Sonnet Rizor: Treating Kanchan Gal/Extender: Jaci Lazier in Treatment: 30 Encounter Discharge Information Items Discharge Condition: Stable Ambulatory Status: Ambulatory Discharge Destination: Home Transportation: Private Auto Accompanied By: self Schedule Follow-up Appointment: Yes Clinical Summary of Care: Patient Declined Electronic Signature(s) Signed: 03/03/2020 5:37:40 PM By: Kela Millin Entered By: Kela Millin on 03/03/2020 09:28:02 -------------------------------------------------------------------------------- Patient/Caregiver Education Details Patient Name: Date of Service: [name] HA M 6/14/2021andnbsp9:00 A M Medical Record Number: [id number] Patient Account Number: [id number] Date of Birth/Gender: Treating RN: November 06, 1945 (74 y.o. [name] Primary Care Physician: [name] Other Clinician: Referring Physician: Treating Physician/Extender: Jaci Lazier in Treatment: 30 Education Assessment Education Provided To: Patient Education Topics Provided Wound/Skin Impairment: Handouts: Caring for Your Ulcer Methods: Explain/Verbal Responses: State content  correctly Electronic Signature(s) Signed: 03/03/2020 5:37:40 PM By: Kela Millin Entered By: Kela Millin on 03/03/2020 09:27:47 -------------------------------------------------------------------------------- Wound Assessment Details Patient Name: Date of Service: [name], [name] HA M 03/03/2020 9:00 A M Medical Record Number: [id number] Patient Account Number: [id number] Date of Birth/Sex: Treating RN: [birth year] (74 y.o. [name] Primary Care Laquinton Bihm: [name] Other Clinician: Referring Christel Bai: Treating Barclay Lennox/Extender: Jaci Lazier in Treatment: 30 Wound Status Wound Number: 3 Primary Venous Leg Ulcer Etiology: Wound Location: Right, Posterior Lower Leg Wound Open Wounding Event: Trauma Status: Date Acquired: 06/06/2019 Comorbid Cataracts, Anemia, Arrhythmia, Congestive Heart Failure, Weeks Of Treatment: 30 History:  Hypertension, Type II Diabetes, Gout, Neuropathy Clustered Wound: No Wound Measurements Length: (cm) 2.1 Width: (cm) 1.2 Depth: (cm) 0.1 Area: (cm) 1.979 Volume: (cm) 0.198 % Reduction in Area: 91.2% % Reduction in Volume: 97.1% Epithelialization: Large (67-100%) Tunneling: No Undermining: No Wound Description Classification: Full Thickness Without Exposed Support Structures Wound Margin: Flat and Intact Exudate Amount: Medium Exudate Type: Serosanguineous Exudate Color: red, brown Foul Odor After Cleansing: No Slough/Fibrino No Wound Bed Granulation Amount: Large (67-100%) Exposed Structure Granulation Quality: Red Fascia Exposed: No Necrotic Amount: None Present (0%) Fat Layer (Subcutaneous Tissue) Exposed: Yes Tendon Exposed: No Muscle Exposed: No Joint Exposed: No Bone Exposed: No Treatment Notes Wound #3 (Right, Posterior Lower Leg) 1. Cleanse With Wound Cleanser Soap and water 2. Periwound Care Moisturizing lotion 3. Primary Dressing Applied Hydrofera Blue 4.  Secondary Dressing ABD Pad Foam 6. Support Layer [employer] Notes HB classic with normal saline. foam applied for protection at achilles area per patient. Electronic Signature(s) Signed: 03/03/2020 5:37:40 PM By: Kela Millin Entered By: Kela Millin on 03/03/2020 09:15:56 -------------------------------------------------------------------------------- Vitals Details Patient Name: Date of Service: [name], PEDDIE HA M 03/03/2020 9:00 A M Medical Record Number: [id number] Patient Account Number: [id number] Date of Birth/Sex: Treating RN: 07/04/1946 (74 y.o. [name] Primary Care Nyheim Seufert: [name] Other Clinician: Referring Nabor Thomann: Treating Lillyian Heidt/Extender: Jaci Lazier in Treatment: 30 Vital Signs Time Taken: 09:10 Temperature (F): 98.4 Height (in): 67 Pulse (bpm): 65 Weight (lbs): 190 Respiratory Rate (breaths/min): 20 Body Mass Index (BMI): 29.8 Blood Pressure (mmHg): 184/94 Reference Range: 80 - 120 mg / dl Electronic Signature(s) Signed: 03/03/2020 5:38:02 PM By: [name] RN, BSN Entered By: [name] on 03/03/2020 09:39:51

## 2020-03-03 NOTE — Progress Notes (Signed)
[name], [name] ([id number]) Visit Report for 03/03/2020 SuperBill Details Patient Name: Date of Service: [name], [name] 03/03/2020 Medical Record Number: [id number] Patient Account Number: [id number] Date of Birth/Sex: Treating RN: 1946-05-14 (74 y.o. 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

## 2020-03-10 ENCOUNTER — Encounter (HOSPITAL_BASED_OUTPATIENT_CLINIC_OR_DEPARTMENT_OTHER): Payer: Medicare Other | Admitting: Internal Medicine

## 2020-03-10 DIAGNOSIS — I87321 Chronic venous hypertension (idiopathic) with inflammation of right lower extremity: Secondary | ICD-10-CM | POA: Diagnosis not present

## 2020-03-11 NOTE — Progress Notes (Signed)
[name] ([id number]) Visit Report for 03/10/2020 HPI Details Patient Name: Date of Service: [name], [name] 03/10/2020 9:30 [name] Medical Record Number: [id number] Patient Account Number: [id number] Date of Birth/Sex: Treating RN: 1946/06/19 (74 y.o. [name] Primary Care Provider: [name] Other Clinician: Referring Provider: Treating Provider/Extender: [name] in Treatment: 31 History of Present Illness HPI Description: [name] HPI Description: 74 year old gentleman who was recently seen by his nephrologist [name], and noted to have a wound on his left lower extremity which was lacerated 2 months ago and now has reopened. The patient's left shin has a ulceration with some exudate but no evidence of infection and he was referred to [place] for further care as it was known that the patient has had some peripheral vascular disease in the past. Past medical history significant for chronic kidney disease, atrial fibrillation, diabetes mellitus,status post kidney transplant in 1983 and 2005, a week fistula graft placement, status post previous bowel surgery. 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 10/06/2015 -- x-ray of the left tibia and fibula shows no evidence of osteomyelitis. The patient has also had Doppler studies of his extremity and is awaiting the appointment with the vascular surgeon. We have not yet received these reports. 10/13/2015 -- lower extremity venous duplex reflux evaluation shows reflux in the left common femoral vein, left saphenofemoral junction and the proximal greater saphenous vein extending to the proximal calf. There is also reflux in the left proximal to mid small saphenous vein. Arterial duplex studies done showed the resting ABI was not applicable due to tibial artery medial calcification. 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 The right TBI was 1 which is normal and the left ABI was 1 which is normal. Patient has otherwise been doing fine and has been compliant with his dressings. 10/20/2015 -- He was seen by [name] recently for a vascular opinion on 10/15/2015. His left lower extremity venous insufficiency duplex study revealed GSV reflux,SS vein reflux and deep venous reflux in the common femoral vein. His ABIs were non compressible and his TBI on the right was 1.01 and on the left was 0.80. He was asked to continue with the wound care with compressive therapy followed by EVLA of the left GS vein 3 months. He recommended 20-30 mm thigh- high compression stockings and the need for a three-month trial of this. The patient had an Unna boot applied at the vascular office but he could not tolerate this with a lot of pain and issues with his toes and hence came here on Friday for removal of this and we reapplied a 2 layer compression. 11/10/2015 -- patient still has not purchased his 20-30 mm thigh-high compression stockings as prescribed by [name]. Readmission: 08/08/18 on evaluation today patient presents for readmission concerning a new injury to the left anterior lower extremity. He was previously seen in 2017 here in our clinic. He states that he has done fairly well since that point. Nonetheless he is having at this time some pain but states that he hit this on a table that fell over and actually struck his leg. This appears to have pulled back some of his skin which folded in on itself and is causing some difficulty as far as that is concerned. There does  not appear to be any evidence of infection at this time. No fevers, chills, nausea, or vomiting noted at this time. He's been using dressings on his own currently without complication. 08/15/18 on evaluation today patient actually appears to be doing  somewhat better in regard to his wound of the lower [name] when compared to the first visit last week. I had to do a much more extensive debridement at that time it does appear that I'[name] gonna have to perform some debridement today but it does not look to be as extensive by any means. Nonetheless fortunately he does not show any signs of infection he does have discomfort at this site. I believe based on what I'[name] seeing currently he may benefit from Iodoflex to help keep the wound bed clean. Patient tolerated therapy without complication. Upon evaluation today the patient actually appears to be doing excellent in regard to his left lower extremity ulcer. This is much better than the previous two visits where he had a lot of necrotic tissue around the edge of the wound simply due to the fact that again there was a significant skin tear where the edge had been cleared away prior to reattaching and being able to heal appropriately. He seems to be doing much better at this point. 08/28/18 on evaluation today the patient's wound actually does appear to be showing signs of improvement. With that being said though he is improving he would likely note even greater improvement if we were able to sharply debride the wound. Nonetheless this caused him to much discomfort he tells me. 09/04/18 on evaluation today patient actually appears to be showing signs of improvement in regard to his left lower extremity ulcer. He has been tolerating the dressing changes including the wrap although he tells me at this point that the burning does last for a couple of days even with just the Iodoflex. I was afraid that this may been part of the issue that he was having with discomfort. It does seem to be the case. Nonetheless he shows no signs of evidence of infection at this time which is good news. No fevers chills noted ADMISSION to [name] 10/05/2018 This is a patient who was cared for in 2017 and again  in the fall of this year at our sister clinic and [place]. He actually lives in [place] in [place]. We have been dealing with an apparent traumatic area on the left anterior tibial area. This is been present for the last several months. He was supposed to be using Iodoflex Kerlix and Coban however he was hospitalized from 09/05/2018 through 09/11/2018 with delirium secondary to pneumonia. Since then he is only been putting Vaseline gauze on this without compression. He also has a more recent skin tear on the dorsal right hand that may have only happened in the last week. The patient had arterial studies done in 2017 in January which was 3 years ago. At that point he had noncompressible ABIs but really quite good TBI's both normal. Triphasic waveforms on the right monophasic at the left posterior tibial but triphasic at the left dorsalis pedis. His ABIs in our clinic today were both noncompressible 1/23; the patient has wounds on the right dorsal hand just distal to the wrist and on the left anterior lower extremity. Both of these look very healthy he is using Hydrofera Blue 1/30; left anterior lower extremity wound much smaller. Healthy looking surface. The laceration area just distal to the wrist on the dorsal  hand on the right is also just about closed I used Hydrofera Blue here 2/6; left anterior lower extremity wound is much smaller but still open. The laceration area just distal to the wrist on the dorsal hand is fully epithelialized. 2/13; the patient's anterior lower extremity wound is closed. The laceration just distal to the wrist on the dorsal hand is also fully epithelialized and closed. The patient has external compression stockings which I think are 20/30 READMISSION 08/06/2019 [name] is a 74 year old man with had several times previously in our clinic. He is a diabetic with a history of chronic renal insufficiency status post kidney transplant in 1983 and again in 2005. He  was then in 2017 with a laceration on the left lower extremity. He was worked up at the time with arterial studies and reflux studies. The arterial studies showed ABIs to be noncompressible but TBI's were within normal limits. I do not have the reflux studies at the moment. He was also sent here in 2019 with a left lower extremity wound and then again in 2020 with left lower extremity trauma a skin tear on the wrist. He was discharged with 20/30 stockings identified from myself that that might not be enough compression. Nevertheless he states he was wearing these fairly reliably. In September he had a fall with a substantial bruise in the area of the wound. He says he saw orthopedics and they told him there was some muscle strain sometime it later this opened into a wound. He has a fairly substantial wound on the right posterior calf. Satellite areas around this including medially and posteriorly. He has not worn his stockings since the injury Past medical history; includes chronic renal failure secondary to diabetes with kidney transplant x2, atrial fibrillation, heart failure with preserved ejection fraction, coronary artery disease. ABIs on the right in our clinic were once again noncompressible 08/13/2019 on evaluation today patient appears to be doing decently well with regard to his wound compared to last week's evaluation. Unfortunately he is still having a lot of discomfort at this point which is I think in some part due to the 3 layer compression wrap which is a little bit stronger I think for him. When he was here before we actually utilized a Kerlix and Coban wrap which he states seemed to got a little bit better. Nonetheless I think we can probably drop back to this in light of the discomfort that he had. Nonetheless the pain was not really right around the wound itself as much as it was around the ankle in particular. The Iodoflex does seem to have done well for him As the wound is  appearing somewhat better today which is excellent news. 11/30; still complaining of a lot of pain. Apparently arterial studies I ordered 2 weeks ago are below. I do not believe we have an appointment with vein and vascular as of yet; ABI Findings: +---------+------------------+-----+----------+--------+ Right Rt Pressure (mmHg)IndexWaveform Comment  +---------+------------------+-----+----------+--------+ PTA >254 1.50 monophasic  +---------+------------------+-----+----------+--------+ DP >254 1.50 monophasic  +---------+------------------+-----+----------+--------+ Great T [id number] 0.40 Abnormal   +---------+------------------+-----+----------+--------+ +---------+------------------+-----+----------+-------+ Left Lt Pressure (mmHg)IndexWaveform Comment +---------+------------------+-----+----------+-------+ Brachial 169     +---------+------------------+-----+----------+-------+ PTA >254 1.50 monophasic  +---------+------------------+-----+----------+-------+ DP >254 1.50 monophasic  +---------+------------------+-----+----------+-------+ Great T oe40 0.24 Abnormal   +---------+------------------+-----+----------+-------+ Pedal arteries appear hyperemic. Patient refused Brachial pressure in the right arm. Summary: Right: Resting right ankle-brachial index indicates noncompressible right lower extremity arteries. The right toe-brachial index is abnormal. Left: Resting left ankle-brachial index indicates noncompressible left lower  extremity arteries. The left toe-brachial index is abnormal. He is also having considerably more swelling in his left calf. This was not there when I last saw him 2 weeks ago. He tells me that some of the home health compression wraps have been slipping down and that may be the issue here however a month I am uncertain 09/3; sees vascular on December 22. Still complaining of a lot of pain. DVT study I did last  time was negative for DVT 12/14; still complaining of pain which if this is arterial is certainly claudication and rest keeps him uncomfortable at night. He has an appointment with vein and vascular on December 22. Wound surface is better using Iodoflex. Once the surface of this is satisfactory and we have exhausted the vascular route. Perhaps an advanced treatment option. He has a configuration of the venous ulceration although his arterial studies are not very good. The other issue is the patient has a transplanted kidney. This will make angiography difficult and challenging issue 12/21; still complaining of pain and drainage. We are using Iodoflex on the wound under compression. He sees Dr. Donnetta Hutching tomorrow to evaluate his noninvasive studies noted above. He has a transplanted kidney further complicating the options for angiography. 12/28; still using Iodoflex under compression. I have Dr. Luther Parody note from 12/22. he noted arterial studies revealing monophasic waveforms at the pedal vessels bilaterally and calcified vessels making the ABI unreliable. He did not comment on the reduced TBI's. He felt these were venous wounds based on the palpable dorsalis pedis pulse. 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 And they arranged for formal venous duplex with reflux studies in the next several weeks. Follow-up with either Scot Dock or Dr. Oneida Alar 09/24/2019 still using Iodoflex under compression. He has an appointment with Dr. Doren Custard on 1/7 with regards to his venous disease. The patient was not felt to have a primary arterial problem for the nonhealing of his wound. We did attempt to wrap him with 3 layer when he first came into the clinic he complained of a lot of pain in the ankle although this may have been because the dressing fell down somewhat. He has far too edema fluid in the right leg for a good prognosis about healing this wound He comes in today with an excoriation on the bottom part of his  right fifth toe. He thinks he may have done this taking off his clothes and that something got caught on the toe. There is no open wound however the toe itself is very painful 1/11; we are using Iodoflex under compression. The wound bed is clean. He went on to see Dr. Doren Custard on 09/27/2019 he again feels that the patient's arterial supply is adequate. He feels that he might benefit from right greater saphenous vein ablation for a venous ulcer. In the meantime the area that he was complaining about last week on the right fifth toe. An x-ray that I ordered showed marked soft tissue swelling along the distal aspect of the right foot but there was no evidence of osteomyelitis. He comes in today with the fifth toenail just about coming off. He has black eschar underneath the toe on the plantar aspect. The toe was swollen red and very painful. In the right setting this could be a significant soft tissue infection versus ischemia of the toe itself. He did not show this to Dr. Doren Custard 1/18; I am using Iodoflex under compression a large wound on the right posterior calf. He had his  right greater saphenous vein ablation by Dr. Doren Custard although I am not sure that is the only problem here. The right fifth toe which was possibly trauma 2 weeks ago continues to be exceptionally painful with a necrotic tip. Maybe not quite as swollen. I started him empirically on Augmentin 5/125 1 p.o. twice daily last week after discussing this with Dr. Loletha Grayer of nephrology. Perhaps somewhat better this week but not as good as I was hoping. A plain x-ray was negative. He comes in today with an area on the medial right calf that was blistered and now open. In looking at things he appears to be systemically fluid volume overloaded. He has a transplanted kidney. He states he takes his Lasix variably when he has appointments he tries not to take it the later I am really not certain if he takes this reliably. However he has far too much edema fluid  in the right leg to easily heal this wound and he appears to be developing blisters medially to form additional wounds 1/25; his CO2 angiogram was done by Dr. Doren Custard last week. This showed the proximal arteries all to be patent. On the right side the common femoral deep femoral superficial femoral popliteal anterior tibial and peroneal arteries were patent. The posterior tibial was occluded but reconstituted distally via collaterals from the peroneal artery he was felt he should have enough blood flow to heal his wounds including the right fifth toe. The right fifth toe looks better however he is still complaining of a lot of pain. The large area which is a venous ulceration posteriorly has a better looking surface I think we can switch to Hydrofera Blue today 2/1; the patient's original wounds on the right posterior medial calf has come down in width however superior to this he has new denuded areas and I am concerned we simply do not have enough edema control. He has already undergone a right greater saphenous ablation. He had a CO2 angiogram done by Dr. Doren Custard and the comment was that we should have enough blood flow to heal the venous wound however we simply do not seem to have enough edema control with 3 layer compression. The patient is status post kidney transplant although his exact renal function is not really clear. Nor am i sure what dose of diuretic he is supposed to be on. He also has the area on the right fifth toe which was unclear etiology but I think became secondarily infected I gave him 2 weeks of antibiotics for this and this seems to have settled down he still has a black eschar over the tip of the toe. X-ray was negative for fracture. He says he has a history of gout. 2/8; the patient's wound on the right posterior calf is about the same. The superficial area medially also about the same. He got a prescription for prednisoneo Gout after he developed erythema on the dorsal aspect of  his left great toe going along with the right fifth toe which has been problematic all along. He has not taken it because he is concerned about increasing CBGs. He has a transplanted kidney is already on prednisone 5 mg. He would not be a good candidate for NSAIDs. Perhaps colchicine. He is not aware of what his uric acid level is 2/15; his right posterior calf wound seems to be coming in in terms of width. Everything here looks fairly good. No mechanical debridement we have been using Hydrofera Blue. He has had an ablation by vein  and vascular. Felt to have adequate arterial supply to this area. The patient got prednisone last week from Dr. Angelique Holm of nephrology. At my urging he actually took it. The area on his left toe was a lot better. The right toe was not as painful but still erythematous with a wound at the tip. We have been using silver alginate here 2/22; right posterior calf seems to be gradually epithelializing. Still a fairly substantial wound. He still has an area on the tip of his right fifth toe which I think was gout related. This is gradually improving. We have been using Unna boots to wrap X-ray I did of the foot last time was again negative there was soft tissue irregularity about the distal fifth digit but no radiographic evidence of bone damage 3/1; right posterior calf seems to be gradually improving however there were areas of hyper granulation. We have been using Hydrofera Blue. The hyper granulation is mostly evident in the most superior finger shape projection of the wound itself. The area on the right fifth toe also was slough covered and required debridement. 3/8 continued improvement in the right posterior calf and the tip of the right fifth toe. We have been using Hydrofera Blue under compression he is changing the area to the toe 3/22; continued improvement in the right posterior calf. Unfortunately comes in today with a new skin tear in the mid anterior tibia area  this probably had something to do with changing his dressing home health I called and left this a message last week. 3/29; continued improvement in the a substantial wound on the right posterior calf. The skin tear anteriorly from last week is already healed on the tip of the right fifth toe there is still a nonviable area. 4/5; we have continued contraction of the substantial wound on the right posterior calf. We continue to have problems with the tip of the right fifth toe. We have been using Hydrofera Blue to both wound areas He is complaining about some discomfort in the Achilles area. T me he had surgery for what sounds like a torn Achilles about 10 years ago. ells 4/12; we have continued contraction of the substantial wound on the right posterior calf. Still an area on the tip of the right fifth toe. I changed him to silver collagen on the toe last week but I think home health continue to use Hydrofera Blue to both wound areas. 4/19; we continue to have contraction of the substantial wound on the right posterior calf however there continues to be hyper granulation. The area on the tip of the fifth toe is very small still some debris on the surface we have been using silver collagen 4/26; both wounds have contracted. I was hoping the fifth toe wound would close over but with probing there is still an open small hole here. 5/3; his wound on the right posterior calf which was his major area continues to contract looks healthy we have been using Hydrofera Blue. Using silver collagen to the fifth toe, not making a lot of progress here 5/10; right posterior calf wound continues to be smaller in terms of surface area and look healthy we have been using Hydrofera Blue here. The tip of the right fifth toe is still open requiring debridement. He has a new laceration on the right forearm he says he hit this on a microwave 6 days ago 02/11/20-The right fifth toe wound is closed, the right posterior calf  wound looks healthy with smaller dimensions  compared with last time, the right forearm area of laceration has a small blister which I try to open up with scalpel with very little fluid 6/7 the right fifth toe looks fine. Area on the right forearm is also closed. Right posterior calf wound continues to contract. Much smaller area remaining with healthy surface. We are using Hydrofera Blue under compression 6/21; area on the right posterior calf. There is only 1 small area in the most distal part of this. We have been using Hydrofera Blue under compression this is contracting surface looks healthy Electronic Signature(s) Signed: 03/10/2020 6:03:41 PM By: [name] MD Entered By: [name] on 03/10/2020 10:48:53 -------------------------------------------------------------------------------- Physical Exam Details Patient Name: Date of Service: [name], [name] 03/10/2020 9:30 [name] Medical Record Number: [id number] Patient Account Number: [id number] Date of Birth/Sex: Treating RN: [birth year] (74 y.o. [name] Primary Care Provider: [name] Other Clinician: Referring Provider: Treating Provider/Extender: [name] in Treatment: 31 Respiratory work of breathing is normal. Cardiovascular Pedal pulses are palpable. Musculoskeletal Fifth toe remains closed. It is not tender.. Notes Wound exam; right posterior calf wound only has 1 small area in the most inferior part of the presumed original very large wound. This has healthy granulation. Under illumination I did not see any need for debridement. No surrounding infection. Electronic Signature(s) Signed: 03/10/2020 6:03:41 PM By: [name] MD Entered By: [name] on 03/10/2020 10:50:27 -------------------------------------------------------------------------------- Physician Orders Details Patient Name: Date of Service: [name], [name] 03/10/2020 9:30 A  [name] Medical Record Number: [id number] Patient Account Number: [id number] Date of Birth/Sex: Treating RN: October 02, 1945 (74 y.o. [name] Primary Care Provider: [name] Other Clinician: Referring Provider: Treating Provider/Extender: [name] in Treatment: 30 Verbal / Phone Orders: No Diagnosis Coding Follow-up Appointments Return Appointment in 1 week. Dressing Change Frequency Wound #3 Right,Posterior Lower Leg Other: - twice a week (once by wound clinic on Monday, once by home health on Thursday or Friday) Skin Barriers/Peri-Wound Care Wound #3 Right,Posterior Lower Leg Barrier cream Moisturizing lotion - to leg Wound Cleansing Clean wound with Normal Saline. - or normal saline on days that dressing is changed May shower with protection. Primary Wound Dressing Wound #3 Right,Posterior Lower Leg Hydrofera Blue - Classic Secondary Dressing Wound #3 Right,Posterior Lower Leg Foam - foam top bend of foot for protection ABD pad Edema Control Unna Boot to Right Lower Extremity - *****ONLY ONE SINGLE LAYER OF UNNA BOOT***** - foam to achilles to pad Avoid standing for long periods of time Elevate legs to the level of the heart or above for 30 minutes daily and/or when sitting, a frequency of: - throughout the day Exercise regularly [facility] for wound care. - [facility] Electronic Signature(s) Signed: 03/10/2020 6:03:41 PM By: [name] MD Signed: 03/11/2020 5:24:50 PM By: [name] RN, BSN Entered By: [name] on 03/10/2020 10:38:34 -------------------------------------------------------------------------------- Problem List Details Patient Name: Date of Service: [name], [name] 03/10/2020 9:30 [name] Medical Record Number: [id number] Patient Account Number: [id number] Date of Birth/Sex: Treating RN: 28-Mar-1946 (74 y.o. [name] Primary Care Provider:  [name] Other Clinician: Referring Provider: Treating Provider/Extender: [name] in Treatment: 31 Active Problems ICD-10 Encounter Code Description Active Date MDM Diagnosis S80.11XD Contusion of right lower leg, subsequent encounter 08/06/2019 No Yes L97.212 Non-pressure chronic ulcer of right calf with fat layer exposed 08/06/2019  No Yes I87.321 Chronic venous hypertension (idiopathic) with inflammation of right lower 08/06/2019 No Yes extremity E11.622 Type 2 diabetes mellitus with other skin ulcer 08/06/2019 No Yes Inactive Problems ICD-10 Code Description Active Date Inactive Date L97.518 Non-pressure chronic ulcer of other part of right foot with other specified severity 10/01/2019 10/01/2019 L97.811 Non-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 12/10/2019 12/10/2019 S41.111D Laceration without foreign body of right upper arm, subsequent encounter 01/28/2020 01/28/2020 Resolved Problems Electronic Signature(s) Signed: 03/10/2020 6:03:41 PM By: [name] MD Entered By: [name] on 03/10/2020 10:48:06 -------------------------------------------------------------------------------- Progress Note Details Patient Name: Date of Service: [name], [name] 03/10/2020 9:30 [name] Medical Record Number: [id number] Patient Account Number: [id number] Date of Birth/Sex: Treating RN: 01-19-1946 (74 y.o. [name] Primary Care Provider: [name] Other Clinician: Referring Provider: Treating Provider/Extender: [name] in Treatment: 31 Subjective History of Present Illness (HPI) [place] HPI Description: 74 year old gentleman who was recently seen by his nephrologist [name], and noted to have a wound on his left lower extremity which was lacerated 2 months ago and now has reopened. The patient's left shin has a ulceration  with some exudate but no evidence of infection and he was referred to [place] for further care as it was known that the patient has had some peripheral vascular disease in the past. Past medical history significant for chronic kidney disease, atrial fibrillation, diabetes mellitus,status post kidney transplant in 1983 and 2005, a week fistula graft placement, status post previous bowel surgery. 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 10/06/2015 -- x-ray of the left tibia and fibula shows no evidence of osteomyelitis. The patient has also had Doppler studies of his extremity and is awaiting the appointment with the vascular surgeon. We have not yet received these reports. 10/13/2015 -- lower extremity venous duplex reflux evaluation shows reflux in the left common femoral vein, left saphenofemoral junction and the proximal greater saphenous vein extending to the proximal calf. There is also reflux in the left proximal to mid small saphenous vein. Arterial duplex studies done showed the resting ABI was not applicable due to tibial artery medial calcification. 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 The right TBI was 1 which is normal and the left ABI was 1 which is normal. Patient has otherwise been doing fine and has been compliant with his dressings. 10/20/2015 -- He was seen by [name] recently for a vascular opinion on 10/15/2015. His left lower extremity venous insufficiency duplex study revealed GSV reflux,SS vein reflux and deep venous reflux in the common femoral vein. His ABIs were non compressible and his TBI on the right was 1.01 and on the left was 0.80. He was asked to continue with the wound care with compressive therapy followed by EVLA of the left GS vein 3 months. He recommended 20-30 mm thigh- high compression stockings  and the need for a three-month trial of this. The patient had an Unna boot applied at the vascular office but he could not tolerate this with a lot of pain and issues with his toes and hence came here on Friday for removal of this and we reapplied a 2 layer compression. 11/10/2015 -- patient still has not purchased his 20-30 mm thigh-high compression stockings  as prescribed by [name]. Readmission: 08/08/18 on evaluation today patient presents for readmission concerning a new injury to the left anterior lower extremity. He was previously seen in 2017 here in our clinic. He states that he has done fairly well since that point. Nonetheless he is having at this time some pain but states that he hit this on a table that fell over and actually struck his leg. This appears to have pulled back some of his skin which folded in on itself and is causing some difficulty as far as that is concerned. There does not appear to be any evidence of infection at this time. No fevers, chills, nausea, or vomiting noted at this time. He's been using dressings on his own currently without complication. 08/15/18 on evaluation today patient actually appears to be doing somewhat better in regard to his wound of the lower [name] when compared to the first visit last week. I had to do a much more extensive debridement at that time it does appear that I'[name] gonna have to perform some debridement today but it does not look to be as extensive by any means. Nonetheless fortunately he does not show any signs of infection he does have discomfort at this site. I believe based on what I'[name] seeing currently he may benefit from Iodoflex to help keep the wound bed clean. Patient tolerated therapy without complication. Upon evaluation today the patient actually appears to be doing excellent in regard to his left lower extremity ulcer. This is much better than the previous two visits where he had a lot of necrotic tissue around the edge of  the wound simply due to the fact that again there was a significant skin tear where the edge had been cleared away prior to reattaching and being able to heal appropriately. He seems to be doing much better at this point. 08/28/18 on evaluation today the patient's wound actually does appear to be showing signs of improvement. With that being said though he is improving he would likely note even greater improvement if we were able to sharply debride the wound. Nonetheless this caused him to much discomfort he tells me. 09/04/18 on evaluation today patient actually appears to be showing signs of improvement in regard to his left lower extremity ulcer. He has been tolerating the dressing changes including the wrap although he tells me at this point that the burning does last for a couple of days even with just the Iodoflex. I was afraid that this may been part of the issue that he was having with discomfort. It does seem to be the case. Nonetheless he shows no signs of evidence of infection at this time which is good news. No fevers chills noted ADMISSION to [name] 10/05/2018 This is a patient who was cared for in 2017 and again in the fall of this year at our sister clinic and [facility]. He actually lives in [place] in [place]. We have been dealing with an apparent traumatic area on the left anterior tibial area. This is been present for the last several months. He was supposed to be using Iodoflex Kerlix and Coban however he was hospitalized from 09/05/2018 through 09/11/2018 with delirium secondary to pneumonia. Since then he is only been putting Vaseline gauze on this without compression. He also has a more recent skin tear on the dorsal right hand that may have only happened in the last week. The patient had arterial studies done in 2017 in January which was 3  years ago. At that point he had noncompressible ABIs but really quite good TBI's both normal. Triphasic waveforms on  the right monophasic at the left posterior tibial but triphasic at the left dorsalis pedis. His ABIs in our clinic today were both noncompressible 1/23; the patient has wounds on the right dorsal hand just distal to the wrist and on the left anterior lower extremity. Both of these look very healthy he is using Hydrofera Blue 1/30; left anterior lower extremity wound much smaller. Healthy looking surface. The laceration area just distal to the wrist on the dorsal hand on the right is also just about closed I used Hydrofera Blue here 2/6; left anterior lower extremity wound is much smaller but still open. The laceration area just distal to the wrist on the dorsal hand is fully epithelialized. 2/13; the patient's anterior lower extremity wound is closed. The laceration just distal to the wrist on the dorsal hand is also fully epithelialized and closed. The patient has external compression stockings which I think are 20/30 READMISSION 08/06/2019 Mr. Dansby is a 74 year old man with had several times previously in our clinic. He is a diabetic with a history of chronic renal insufficiency status post kidney transplant in 1983 and again in 2005. He was then in 2017 with a laceration on the left lower extremity. He was worked up at the time with arterial studies and reflux studies. The arterial studies showed ABIs to be noncompressible but TBI's were within normal limits. I do not have the reflux studies at the moment. He was also sent here in 2019 with a left lower extremity wound and then again in 2020 with left lower extremity trauma a skin tear on the wrist. He was discharged with 20/30 stockings identified from myself that that might not be enough compression. Nevertheless he states he was wearing these fairly reliably. In September he had a fall with a substantial bruise in the area of the wound. He says he saw orthopedics and they told him there was some muscle strain sometime it later this opened  into a wound. He has a fairly substantial wound on the right posterior calf. Satellite areas around this including medially and posteriorly. He has not worn his stockings since the injury Past medical history; includes chronic renal failure secondary to diabetes with kidney transplant x2, atrial fibrillation, heart failure with preserved ejection fraction, coronary artery disease. ABIs on the right in our clinic were once again noncompressible 08/13/2019 on evaluation today patient appears to be doing decently well with regard to his wound compared to last week's evaluation. Unfortunately he is still having a lot of discomfort at this point which is I think in some part due to the 3 layer compression wrap which is a little bit stronger I think for him. When he was here before we actually utilized a Kerlix and Coban wrap which he states seemed to got a little bit better. Nonetheless I think we can probably drop back to this in light of the discomfort that he had. Nonetheless the pain was not really right around the wound itself as much as it was around the ankle in particular. The Iodoflex does seem to have done well for him As the wound is appearing somewhat better today which is excellent news. 11/30; still complaining of a lot of pain. Apparently arterial studies I ordered 2 weeks ago are below. I do not believe we have an appointment with vein and vascular as of yet; ABI Findings: +---------+------------------+-----+----------+--------+ Right Rt  Pressure (mmHg)IndexWaveform Comment  +---------+------------------+-----+----------+--------+ PTA >254 1.50 monophasic  +---------+------------------+-----+----------+--------+ DP >254 1.50 monophasic  +---------+------------------+-----+----------+--------+ Great T oe68 0.40 Abnormal   +---------+------------------+-----+----------+--------+ +---------+------------------+-----+----------+-------+ Left Lt Pressure  (mmHg)IndexWaveform Comment +---------+------------------+-----+----------+-------+ Brachial 169     +---------+------------------+-----+----------+-------+ PTA >254 1.50 monophasic  +---------+------------------+-----+----------+-------+ DP >254 1.50 monophasic  +---------+------------------+-----+----------+-------+ Great T oe40 0.24 Abnormal   +---------+------------------+-----+----------+-------+ Pedal arteries appear hyperemic. Patient refused Brachial pressure in the right arm. Summary: Right: Resting right ankle-brachial index indicates noncompressible right lower extremity arteries. The right toe-brachial index is abnormal. Left: Resting left ankle-brachial index indicates noncompressible left lower extremity arteries. The left toe-brachial index is abnormal. He is also having considerably more swelling in his left calf. This was not there when I last saw him 2 weeks ago. He tells me that some of the home health compression wraps have been slipping down and that may be the issue here however a month I am uncertain 71/0; sees vascular on December 22. Still complaining of a lot of pain. DVT study I did last time was negative for DVT 12/14; still complaining of pain which if this is arterial is certainly claudication and rest keeps him uncomfortable at night. He has an appointment with vein and vascular on December 22. Wound surface is better using Iodoflex. Once the surface of this is satisfactory and we have exhausted the vascular route. Perhaps an advanced treatment option. He has a configuration of the venous ulceration although his arterial studies are not very good. The other issue is the patient has a transplanted kidney. This will make angiography difficult and challenging issue 12/21; still complaining of pain and drainage. We are using Iodoflex on the wound under compression. He sees Dr. Donnetta Hutching tomorrow to evaluate his noninvasive studies noted above.  He has a transplanted kidney further complicating the options for angiography. 12/28; still using Iodoflex under compression. I have Dr. Luther Parody note from 12/22. he noted arterial studies revealing monophasic waveforms at the pedal vessels bilaterally and calcified vessels making the ABI unreliable. He did not comment on the reduced TBI's. He felt these were venous wounds based on the palpable dorsalis pedis pulse. 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 And they arranged for formal venous duplex with reflux studies in the next several weeks. Follow-up with either Scot Dock or Dr. Oneida Alar 09/24/2019 still using Iodoflex under compression. He has an appointment with Dr. Doren Custard on 1/7 with regards to his venous disease. The patient was not felt to have a primary arterial problem for the nonhealing of his wound. We did attempt to wrap him with 3 layer when he first came into the clinic he complained of a lot of pain in the ankle although this may have been because the dressing fell down somewhat. He has far too edema fluid in the right leg for a good prognosis about healing this wound He comes in today with an excoriation on the bottom part of his right fifth toe. He thinks he may have done this taking off his clothes and that something got caught on the toe. There is no open wound however the toe itself is very painful 1/11; we are using Iodoflex under compression. The wound bed is clean. He went on to see Dr. Doren Custard on 09/27/2019 he again feels that the patient's arterial supply is adequate. He feels that he might benefit from right greater saphenous vein ablation for a venous ulcer. In the meantime the area that he was complaining about last week on the right fifth toe. An x-ray  that I ordered showed marked soft tissue swelling along the distal aspect of the right foot but there was no evidence of osteomyelitis. He comes in today with the fifth toenail just about coming off. He has black  eschar underneath the toe on the plantar aspect. The toe was swollen red and very painful. In the right setting this could be a significant soft tissue infection versus ischemia of the toe itself. He did not show this to Dr. Doren Custard 1/18; I am using Iodoflex under compression a large wound on the right posterior calf. He had his right greater saphenous vein ablation by Dr. Doren Custard although I am not sure that is the only problem here. ooThe right fifth toe which was possibly trauma 2 weeks ago continues to be exceptionally painful with a necrotic tip. Maybe not quite as swollen. I started him empirically on Augmentin 5/125 1 p.o. twice daily last week after discussing this with Dr. Loletha Grayer of nephrology. Perhaps somewhat better this week but not as good as I was hoping. A plain x-ray was negative. He comes in today with an area on the medial right calf that was blistered and now open. In looking at things he appears to be systemically fluid volume overloaded. He has a transplanted kidney. He states he takes his Lasix variably when he has appointments he tries not to take it the later I am really not certain if he takes this reliably. However he has far too much edema fluid in the right leg to easily heal this wound and he appears to be developing blisters medially to form additional wounds 1/25; his CO2 angiogram was done by Dr. Doren Custard last week. This showed the proximal arteries all to be patent. On the right side the common femoral deep femoral superficial femoral popliteal anterior tibial and peroneal arteries were patent. The posterior tibial was occluded but reconstituted distally via collaterals from the peroneal artery he was felt he should have enough blood flow to heal his wounds including the right fifth toe. The right fifth toe looks better however he is still complaining of a lot of pain. The large area which is a venous ulceration posteriorly has a better looking surface I think we can switch to  Hydrofera Blue today 2/1; the patient's original wounds on the right posterior medial calf has come down in width however superior to this he has new denuded areas and I am concerned we simply do not have enough edema control. He has already undergone a right greater saphenous ablation. He had a CO2 angiogram done by Dr. Doren Custard and the comment was that we should have enough blood flow to heal the venous wound however we simply do not seem to have enough edema control with 3 layer compression. The patient is status post kidney transplant although his exact renal function is not really clear. Nor am i sure what dose of diuretic he is supposed to be on. He also has the area on the right fifth toe which was unclear etiology but I think became secondarily infected I gave him 2 weeks of antibiotics for this and this seems to have settled down he still has a black eschar over the tip of the toe. X-ray was negative for fracture. He says he has a history of gout. 2/8; the patient's wound on the right posterior calf is about the same. The superficial area medially also about the same. He got a prescription for prednisoneo Gout after he developed erythema on the dorsal aspect of  his left great toe going along with the right fifth toe which has been problematic all along. He has not taken it because he is concerned about increasing CBGs. He has a transplanted kidney is already on prednisone 5 mg. He would not be a good candidate for NSAIDs. Perhaps colchicine. He is not aware of what his uric acid level is 2/15; his right posterior calf wound seems to be coming in in terms of width. Everything here looks fairly good. No mechanical debridement we have been using Hydrofera Blue. He has had an ablation by vein and vascular. Felt to have adequate arterial supply to this area. The patient got prednisone last week from Dr. Angelique Holm of nephrology. At my urging he actually took it. The area on his left toe was a lot  better. The right toe was not as painful but still erythematous with a wound at the tip. We have been using silver alginate here 2/22; right posterior calf seems to be gradually epithelializing. Still a fairly substantial wound. He still has an area on the tip of his right fifth toe which I think was gout related. This is gradually improving. We have been using Unna boots to wrap X-ray I did of the foot last time was again negative there was soft tissue irregularity about the distal fifth digit but no radiographic evidence of bone damage 3/1; right posterior calf seems to be gradually improving however there were areas of hyper granulation. We have been using Hydrofera Blue. The hyper granulation is mostly evident in the most superior finger shape projection of the wound itself. ooThe area on the right fifth toe also was slough covered and required debridement. 3/8 continued improvement in the right posterior calf and the tip of the right fifth toe. We have been using Hydrofera Blue under compression he is changing the area to the toe 3/22; continued improvement in the right posterior calf. Unfortunately comes in today with a new skin tear in the mid anterior tibia area this probably had something to do with changing his dressing home health I called and left this a message last week. 3/29; continued improvement in the a substantial wound on the right posterior calf. The skin tear anteriorly from last week is already healed on the tip of the right fifth toe there is still a nonviable area. 4/5; we have continued contraction of the substantial wound on the right posterior calf. We continue to have problems with the tip of the right fifth toe. We have been using Hydrofera Blue to both wound areas He is complaining about some discomfort in the Achilles area. T me he had surgery for what sounds like a torn Achilles about 10 years ago. ells 4/12; we have continued contraction of the substantial wound  on the right posterior calf. ooStill an area on the tip of the right fifth toe. I changed him to silver collagen on the toe last week but I think home health continue to use Hydrofera Blue to both wound areas. 4/19; we continue to have contraction of the substantial wound on the right posterior calf however there continues to be hyper granulation. ooThe area on the tip of the fifth toe is very small still some debris on the surface we have been using silver collagen 4/26; both wounds have contracted. I was hoping the fifth toe wound would close over but with probing there is still an open small hole here. 5/3; his wound on the right posterior calf which was his major area  continues to contract looks healthy we have been using Hydrofera Blue. Using silver collagen to the fifth toe, not making a lot of progress here 5/10; right posterior calf wound continues to be smaller in terms of surface area and look healthy we have been using Hydrofera Blue here. The tip of the right fifth toe is still open requiring debridement. He has a new laceration on the right forearm he says he hit this on a microwave 6 days ago 02/11/20-The right fifth toe wound is closed, the right posterior calf wound looks healthy with smaller dimensions compared with last time, the right forearm area of laceration has a small blister which I try to open up with scalpel with very little fluid 6/7 the right fifth toe looks fine. Area on the right forearm is also closed. Right posterior calf wound continues to contract. Much smaller area remaining with healthy surface. We are using Hydrofera Blue under compression 6/21; area on the right posterior calf. There is only 1 small area in the most distal part of this. We have been using Hydrofera Blue under compression this is contracting surface looks healthy Objective Respiratory work of breathing is normal. Cardiovascular Pedal pulses are palpable. Musculoskeletal Fifth toe remains  closed. It is not tender.. General Notes: Wound exam; right posterior calf wound only has 1 small area in the most inferior part of the presumed original very large wound. This has healthy granulation. Under illumination I did not see any need for debridement. No surrounding infection. Integumentary (Hair, Skin) Wound #3 status is Open. Original cause of wound was Trauma. The wound is located on the Right,Posterior Lower Leg. The wound measures 1.8cm length x 0.9cm width x 0.2cm depth; 1.272cm^2 area and 0.254cm^3 volume. There is Fat Layer (Subcutaneous Tissue) Exposed exposed. There is no tunneling or undermining noted. There is a medium amount of serosanguineous drainage noted. The wound margin is flat and intact. There is large (67-100%) red granulation within the wound bed. There is no necrotic tissue within the wound bed. 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 There was a Haematologist Compression Therapy Procedure by [name], RN. Post procedure Diagnosis Wound #3: Same as Pre-Procedure Plan Follow-up Appointments: Return Appointment in 1 week. Dressing Change Frequency: Wound #3 Right,Posterior Lower Leg: Other: - twice a week (once by wound clinic on Monday, once by home health on Thursday or Friday) Skin Barriers/Peri-Wound Care: Wound #3 Right,Posterior Lower Leg: Barrier cream Moisturizing lotion - to leg Wound Cleansing: Clean wound with Normal Saline. - or normal saline on days that dressing is changed May shower with protection. Primary Wound Dressing: Wound #3 Right,Posterior Lower Leg: Hydrofera Blue -  Classic Secondary Dressing: Wound #3 Right,Posterior Lower Leg: Foam - foam top bend of foot for protection ABD pad Edema Control: Unna Boot to Right Lower Extremity - *****ONLY ONE SINGLE LAYER OF UNNA BOOT***** - foam to achilles to pad Avoid standing for long periods of time Elevate legs to the level of the heart or above for 30 minutes daily and/or when sitting, a frequency of: - throughout the day Exercise regularly Home Health: [facility] for [facility]. - [facility]. We are continuing with  Hydrofera Blue under 4-layer compression 2. I have asked him to bring his compression stockings he tells me he has for next week. I had like to see him using this on the left leg 3. Careful attention to the measurements of the remaining area on the right calf next week. Electronic Signature(s) Signed: 03/10/2020 6:03:41 PM By: [name] MD Entered By: [name] on 03/10/2020 10:52:47 -------------------------------------------------------------------------------- SuperBill Details Patient Name: Date of Service: [name], [name] 03/10/2020 Medical Record Number: [id number] Patient Account Number: [id number] Date of Birth/Sex: Treating RN: Sep 22, 1945 (74 y.o. 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 E11.622 Type 2 diabetes mellitus with other skin ulcer Facility Procedures CPT4 Code: 35573220 ( Description: Facility Use Only) 25427CW - APPLY Rolena Infante RT Modifier: Quantity: 1 Physician Procedures : CPT4 Code Description Modifier  [id number] - WC PHYS LEVEL 3 - EST PT ICD-10 Diagnosis Description S80.11XD Contusion of right lower leg, subsequent encounter L97.212 Non-pressure chronic ulcer of right calf with fat layer exposed Quantity: 1 Electronic Signature(s) Signed: 03/11/2020 5:20:04 PM By: [name] MD Signed: 03/11/2020 5:24:50 PM By: [name] RN, BSN Previous Signature: 03/10/2020 6:03:41 PM Version By: [name] MD Entered By: [name] on 03/10/2020 18:15:51

## 2020-03-11 NOTE — Progress Notes (Addendum)
[name], [name] ([id number]) Visit Report for 03/10/2020 Arrival Information Details Patient Name: Date of Service: HAAKON, TITSWORTH 03/10/2020 9:30 A M Medical Record Number: [id number] Patient Account Number: [id number] Date of Birth/Sex: Treating RN: 08-28-1946 (74 y.o. [name] Primary Care Allex Madia: [name] Other Clinician: Referring Corneilus Heggie: Treating Liza Czerwinski/Extender: [name] in Treatment: 10 Visit Information History Since Last Visit Added or deleted any medications: No Patient Arrived: Ambulatory Any new allergies or adverse reactions: No Arrival Time: 09:40 Had a fall or experienced change in No Accompanied By: alone activities of daily living that may affect Transfer Assistance: None risk of falls: Patient Identification Verified: Yes Signs or symptoms of abuse/neglect since last visito No Secondary Verification Process Completed: Yes Hospitalized since last visit: No Patient Requires Transmission-Based Precautions: No Implantable device outside of the clinic excluding No Patient Has Alerts: Yes cellular tissue based products placed in the center Patient Alerts: non compressable since last visit: Has Dressing in Place as Prescribed: Yes Has Compression in Place as Prescribed: Yes Pain Present Now: No Electronic Signature(s) Signed: 03/11/2020 5:24:50 PM By: [name] RN, BSN Entered By: [name] on 03/10/2020 18:13:56 -------------------------------------------------------------------------------- Compression Therapy Details Patient Name: Date of Service: MAKARI, PORTMAN HA M 03/10/2020 9:30 A M Medical Record Number: [id number] Patient Account Number: [id number] Date of Birth/Sex: Treating RN: 27-Sep-1945 (74 y.o. [name] Primary Care Daimon Kean: [name] Other Clinician: Referring Kiley Solimine: Treating Kiyara Bouffard/Extender: [name] in  Treatment: 31 Compression Therapy Performed for Wound Assessment: Wound #3 Right,Posterior Lower Leg Performed By: Clinician [name], RN Compression Type: Rolena Infante Post Procedure Diagnosis Same as Pre-procedure Electronic Signature(s) Signed: 03/11/2020 5:24:50 PM By: [name] RN, BSN Entered By: [name] on 03/10/2020 10:39:06 -------------------------------------------------------------------------------- Encounter Discharge Information Details Patient Name: Date of Service: LILLARD, BAILON HA M 03/10/2020 9:30 A M Medical Record Number: [id number] Patient Account Number: [id number] Date of Birth/Sex: Treating RN: 1946-04-23 (74 y.o. [name] Primary Care Abbott Jasinski: [name] Other Clinician: Referring Whitney Bingaman: Treating Laren Whaling/Extender: [name] in Treatment: 36 Encounter Discharge Information Items Discharge Condition: Stable Ambulatory Status: Ambulatory Discharge Destination: Home Transportation: Private Auto Accompanied By: self Schedule Follow-up Appointment: Yes Clinical Summary of Care: Electronic Signature(s) Signed: 03/10/2020 6:25:00 PM By: [name] Entered By: [name] on 03/10/2020 10:52:00 -------------------------------------------------------------------------------- Lower Extremity Assessment Details Patient Name: Date of Service: AHMAR, PICKRELL 03/10/2020 9:30 A M Medical Record Number: [id number] Patient Account Number: [id number] Date of Birth/Sex: Treating RN: 1946-07-26 (74 y.o. [name] Primary Care Sakina Briones: [name] Other Clinician: Referring Rickia Freeburg: Treating Rylon Poitra/Extender: [name] in Treatment: 31 Edema Assessment Assessed: Shirlyn Goltz: No] Patrice Paradise: No] Edema: [Left: Ye] [Right: s] Calf Left: Right: Point of Measurement: 30 cm From Medial Instep cm 34 cm Ankle Left: Right: Point of Measurement: 10  cm From Medial Instep cm 23 cm Vascular Assessment Pulses: Dorsalis Pedis Palpable: [Right:Yes] Electronic Signature(s) Signed: 03/11/2020 5:24:50 PM By: [name] RN, BSN Entered By: [name] on 03/10/2020 18:14:28 -------------------------------------------------------------------------------- Multi Wound Chart Details Patient Name: Date of Service: [name], [name] HA M 03/10/2020 9:30 A M Medical Record Number: [id number] Patient Account Number: [id number] Date of Birth/Sex: Treating RN: 22-Mar-1946 (74 y.o. [name] Primary Care Nira Visscher: [name] Other Clinician: Referring Marck Mcclenny: Treating Myrtice Lowdermilk/Extender: [name] in Treatment: 31 Photos: [3:No Photos Right, Posterior Lower  Leg] [N/A:N/A N/A] Wound Location: [3:Trauma] [N/A:N/A] Wounding Event: [3:Venous Leg Ulcer] [N/A:N/A] Primary Etiology: [3:Cataracts, Anemia, Arrhythmia,] [N/A:N/A] Comorbid History: [3:Congestive Heart Failure, Hypertension, Type II Diabetes, Gout, Neuropathy 06/06/2019] [N/A:N/A] Date Acquired: [3:31] [N/A:N/A] Weeks of Treatment: [3:Open] [N/A:N/A] Wound Status: [3:1.8x0.9x0.2] [N/A:N/A] Measurements L x W x D (cm) [3:1.272] [N/A:N/A] A (cm) : rea [3:0.254] [N/A:N/A] Volume (cm) : [3:94.40%] [N/A:N/A] % Reduction in Area: [3:96.20%] [N/A:N/A] % Reduction in Volume: [3:Full Thickness Without Exposed] [N/A:N/A] Classification: [3:Support Structures Medium] [N/A:N/A] Exudate Amount: [3:Serosanguineous] [N/A:N/A] Exudate Type: [3:red, brown] [N/A:N/A] Exudate Color: [3:Flat and Intact] [N/A:N/A] Wound Margin: [3:Large (67-100%)] [N/A:N/A] Granulation Amount: [3:Red] [N/A:N/A] Granulation Quality: [3:None Present (0%)] [N/A:N/A] Necrotic Amount: [3:Fat Layer (Subcutaneous Tissue)] [N/A:N/A] Exposed Structures: [3:Exposed: Yes Fascia: No Tendon: No Muscle: No Joint: No Bone: No Large (67-100%)] [N/A:N/A] Epithelialization:  [3:Compression Therapy] [N/A:N/A] Treatment Notes Electronic Signature(s) Signed: 03/10/2020 6:03:41 PM By: [name] MD Signed: 03/11/2020 5:24:50 PM By: [name] RN, BSN Entered By: [name] on 03/10/2020 10:48:15 -------------------------------------------------------------------------------- Multi-Disciplinary Care Plan Details Patient Name: Date of Service: ONEILL, BAIS HA M 03/10/2020 9:30 A M Medical Record Number: [id number] Patient Account Number: [id number] Date of Birth/Sex: Treating RN: 12-Feb-1946 (74 y.o. [name] Primary Care Ambre Kobayashi: [name] Other Clinician: Referring Itzabella Sorrels: Treating Alizae Bechtel/Extender: [name] in Treatment: 31 Active Inactive Wound/Skin Impairment Nursing Diagnoses: Impaired tissue integrity Goals: Patient/caregiver will verbalize understanding of skin care regimen Date Initiated: 08/06/2019 Target Resolution Date: 03/21/2020 Goal Status: Active Ulcer/skin breakdown will have a volume reduction of 30% by week 4 Date Initiated: 08/06/2019 Date Inactivated: 09/03/2019 Target Resolution Date: 09/07/2019 Goal Status: Unmet Unmet Reason: PAD, necrotic surface Interventions: Assess patient/caregiver ability to obtain necessary supplies Assess patient/caregiver ability to perform ulcer/skin care regimen upon admission and as needed Assess ulceration(s) every visit Provide education on ulcer and skin care Notes: Electronic Signature(s) Signed: 03/11/2020 5:24:50 PM By: [name] RN, BSN Entered By: [name] on 03/10/2020 18:14:54 -------------------------------------------------------------------------------- Pain Assessment Details Patient Name: Date of Service: EMANUELL, MORINA HA M 03/10/2020 9:30 A M Medical Record Number: [id number] Patient Account Number: [id number] Date of Birth/Sex: Treating RN: [birth year] (74 y.o. [name] Primary Care  [place] Petron: [name] Other Clinician: Referring Gearldene Fiorenza: Treating Haaris Metallo/Extender: [name] in Treatment: 31 Active Problems Location of Pain Severity and Description of Pain Patient Has Paino No Site Locations Pain Management and Medication Current Pain Management: Electronic Signature(s) Signed: 03/11/2020 5:24:50 PM By: [name] RN, BSN Entered By: [name] on 03/10/2020 18:14:19 -------------------------------------------------------------------------------- Patient/Caregiver Education Details Patient Name: Date of Service: [name] 6/21/2021andnbsp9:30 A M Medical Record Number: [id number] Patient Account Number: [id number] Date of Birth/Gender: Treating RN: 04-04-1946 (74 y.o. [name] Primary Care Physician: [name] Other Clinician: Referring Physician: Treating Physician/Extender: [name] in Treatment: 31 Education Assessment Education Provided To: Patient Education Topics Provided Wound/Skin Impairment: Methods: Explain/Verbal Responses: State content correctly [employer]) Signed: 03/11/2020 5:24:50 PM By: [name] RN, BSN Entered By: [name] on 03/10/2020 18:15:05 -------------------------------------------------------------------------------- Wound Assessment Details Patient Name: Date of Service: CLEBURNE, SAVINI HA M 03/10/2020 9:30 A M Medical Record Number: [id number] Patient Account Number: [id number] Date of Birth/Sex: Treating RN: [birth year] (74 y.o. [name] Primary Care Zarie Kosiba: [name] Other Clinician: Referring Dora Clauss: Treating Lemoyne Scarpati/Extender: [name] in Treatment: 31 Wound Status Wound Number: 3 Primary Venous Leg Ulcer Etiology:  Wound Location: Right, Posterior Lower Leg Wound Open Wounding Event: Trauma Status: Date Acquired:  06/06/2019 Comorbid Cataracts, Anemia, Arrhythmia, Congestive Heart Failure, Weeks Of Treatment: 31 History: Hypertension, Type II Diabetes, Gout, Neuropathy Clustered Wound: No Photos Wound Measurements Length: (cm) 1.8 Width: (cm) 0.9 Depth: (cm) 0.2 Area: (cm) 1.272 Volume: (cm) 0.254 % Reduction in Area: 94.4% % Reduction in Volume: 96.2% Epithelialization: Large (67-100%) Tunneling: No Undermining: No Wound Description Classification: Full Thickness Without Exposed Support Structures Wound Margin: Flat and Intact Exudate Amount: Medium Exudate Type: Serosanguineous Exudate Color: red, brown Foul Odor After Cleansing: No Slough/Fibrino No Wound Bed Granulation Amount: Large (67-100%) Exposed Structure Granulation Quality: Red Fascia Exposed: No Necrotic Amount: None Present (0%) Fat Layer (Subcutaneous Tissue) Exposed: Yes Tendon Exposed: No Muscle Exposed: No Joint Exposed: No Bone Exposed: No Treatment Notes Wound #3 (Right, Posterior Lower Leg) 1. Cleanse With Wound Cleanser Soap and water 2. Periwound Care Moisturizing lotion 3. Primary Dressing Applied Hydrofera Blue 4. Secondary Dressing ABD Pad 6. Support Layer [employer] Notes HB classic with normal saline. foam applied for protection at achilles area per patient. Electronic Signature(s) Signed: 03/12/2020 5:20:20 PM By: Minerva Fester Signed: 03/14/2020 5:45:26 PM By: [name] RN, BSN Previous Signature: 03/11/2020 5:24:50 PM Version By: [name] RN, BSN Entered By: Minerva Fester on 03/12/2020 10:27:53 -------------------------------------------------------------------------------- Vitals Details Patient Name: Date of Service: GIBBS, NAUGLE HA M 03/10/2020 9:30 A M Medical Record Number: [id number] Patient Account Number: [id number] Date of Birth/Sex: Treating RN: 18-Jun-1946 (74 y.o. [name] Primary Care Deb Loudin: [name] Other Clinician: Referring  Lynnet Hefley: Treating Quatavious Rossa/Extender: [name] in Treatment: 31 Vital Signs Time Taken: 09:40 Temperature (F): 98.5 Height (in): 67 Pulse (bpm): 53 Weight (lbs): 190 Respiratory Rate (breaths/min): 18 Body Mass Index (BMI): 29.8 Blood Pressure (mmHg): 153/70 Reference Range: 80 - 120 mg / dl Electronic Signature(s) Signed: 03/11/2020 5:24:50 PM By: [name] RN, BSN Entered By: [name] on 03/10/2020 18:14:14

## 2020-03-17 ENCOUNTER — Encounter (HOSPITAL_BASED_OUTPATIENT_CLINIC_OR_DEPARTMENT_OTHER): Payer: Medicare Other | Admitting: Internal Medicine

## 2020-03-17 DIAGNOSIS — I87321 Chronic venous hypertension (idiopathic) with inflammation of right lower extremity: Secondary | ICD-10-CM | POA: Diagnosis not present

## 2020-03-17 NOTE — Progress Notes (Signed)
[name], [name] ([id number]) Visit Report for 03/17/2020 HPI Details Patient Name: Date of Service: [name], [name] 03/17/2020 9:00 A M Medical Record Number: [id number] Patient Account Number: [id number] Date of Birth/Sex: Treating RN: 03-05-1946 (74 y.o. [name] Primary Care Provider: Donetta [name] Other Clinician: Referring Provider: Treating Provider/Extender: [name] in Treatment: 59 History of Present Illness HPI Description: [name] HPI Description: 74 year old gentleman who was recently seen by his nephrologist [name], and noted to have a wound on his left lower extremity which was lacerated 2 months ago and now has reopened. The patient's left shin has a ulceration with some exudate but no evidence of infection and he was referred to [place] for further care as it was known that the patient has had some peripheral vascular disease in the past. Past medical history significant for chronic kidney disease, atrial fibrillation, diabetes mellitus,status post kidney transplant in 1983 and 2005, a week fistula graft placement, status post previous bowel surgery. 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 10/06/2015 -- x-ray of the left tibia and fibula shows no evidence of osteomyelitis. The patient has also had Doppler studies of his extremity and is awaiting the appointment with the vascular surgeon. We have not yet received these reports. 10/13/2015 -- lower extremity venous duplex reflux evaluation shows reflux in the left common femoral vein, left saphenofemoral junction and the proximal greater saphenous vein extending to the proximal calf. There is also reflux in the left proximal to mid small saphenous vein. Arterial duplex studies done showed the resting ABI was not applicable due to tibial artery medial calcification. 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 The right TBI was 1 which is normal and the left ABI was 1 which is normal. Patient has otherwise been doing fine and has been compliant with his dressings. 10/20/2015 -- He was seen by [name] recently for a vascular opinion on 10/15/2015. His left lower extremity venous insufficiency duplex study revealed GSV reflux,SS vein reflux and deep venous reflux in the common femoral vein. His ABIs were non compressible and his TBI on the right was 1.01 and on the left was 0.80. He was asked to continue with the wound care with compressive therapy followed by EVLA of the left GS vein 3 months. He recommended 20-30 mm thigh- high compression stockings and the need for a three-month trial of this. The patient had an Unna boot applied at the vascular office but he could not tolerate this with a lot of pain and issues with his toes and hence came here on Friday for removal of this and we reapplied a 2 layer compression. 11/10/2015 -- patient still has not purchased his 20-30 mm thigh-high compression stockings as prescribed by [name]. Readmission: 08/08/18 on evaluation today patient presents for readmission concerning a new injury to the left anterior lower extremity. He was previously seen in 2017 here in our clinic. He states that he has done fairly well since that point. Nonetheless he is having at this time some pain but states that he hit this on a table that fell over and actually struck his leg. This appears to have pulled back some of his skin which folded in on itself and is causing some difficulty as far as that is concerned. There does  not appear to be any evidence of infection at this time. No fevers, chills, nausea, or vomiting noted at this time. He's been using dressings on his own currently without complication. 08/15/18 on evaluation today patient actually appears to be doing  somewhat better in regard to his wound of the lower [name] when compared to the first visit last week. I had to do a much more extensive debridement at that time it does appear that I'm gonna have to perform some debridement today but it does not look to be as extensive by any means. Nonetheless fortunately he does not show any signs of infection he does have discomfort at this site. I believe based on what I'm seeing currently he may benefit from Iodoflex to help keep the wound bed clean. Patient tolerated therapy without complication. Upon evaluation today the patient actually appears to be doing excellent in regard to his left lower extremity ulcer. This is much better than the previous two visits where he had a lot of necrotic tissue around the edge of the wound simply due to the fact that again there was a significant skin tear where the edge had been cleared away prior to reattaching and being able to heal appropriately. He seems to be doing much better at this point. 08/28/18 on evaluation today the patient's wound actually does appear to be showing signs of improvement. With that being said though he is improving he would likely note even greater improvement if we were able to sharply debride the wound. Nonetheless this caused him to much discomfort he tells me. 09/04/18 on evaluation today patient actually appears to be showing signs of improvement in regard to his left lower extremity ulcer. He has been tolerating the dressing changes including the wrap although he tells me at this point that the burning does last for a couple of days even with just the Iodoflex. I was afraid that this may been part of the issue that he was having with discomfort. It does seem to be the case. Nonetheless he shows no signs of evidence of infection at this time which is good news. No fevers chills noted [facility]ION [name] 10/05/2018 This is a patient who was cared for in 2017 and again  in the fall of this year at our sister clinic and [facility]. He actually lives in [place] in [place]. We have been dealing with an apparent traumatic area on the left anterior tibial area. This is been present for the last several months. He was supposed to be using Iodoflex Kerlix and Coban however he was hospitalized from 09/05/2018 through 09/11/2018 with delirium secondary to pneumonia. Since then he is only been putting Vaseline gauze on this without compression. He also has a more recent skin tear on the dorsal right hand that may have only happened in the last week. The patient had arterial studies done in 2017 in January which was 3 years ago. At that point he had noncompressible ABIs but really quite good TBI's both normal. Triphasic waveforms on the right monophasic at the left posterior tibial but triphasic at the left dorsalis pedis. His ABIs in our clinic today were both noncompressible 1/23; the patient has wounds on the right dorsal hand just distal to the wrist and on the left anterior lower extremity. Both of these look very healthy he is using Hydrofera Blue 1/30; left anterior lower extremity wound much smaller. Healthy looking surface. The laceration area just distal to the wrist on the dorsal  hand on the right is also just about closed I used Hydrofera Blue here 2/6; left anterior lower extremity wound is much smaller but still open. The laceration area just distal to the wrist on the dorsal hand is fully epithelialized. 2/13; the patient's anterior lower extremity wound is closed. The laceration just distal to the wrist on the dorsal hand is also fully epithelialized and closed. The patient has external compression stockings which I think are 20/30 READMISSION 08/06/2019 [name] is a 74 year old man with had several times previously in our clinic. He is a diabetic with a history of chronic renal insufficiency status post kidney transplant in 1983 and again in 2005. He  was then in 2017 with a laceration on the left lower extremity. He was worked up at the time with arterial studies and reflux studies. The arterial studies showed ABIs to be noncompressible but TBI's were within normal limits. I do not have the reflux studies at the moment. He was also sent here in 2019 with a left lower extremity wound and then again in 2020 with left lower extremity trauma a skin tear on the wrist. He was discharged with 20/30 stockings identified from myself that that might not be enough compression. Nevertheless he states he was wearing these fairly reliably. In September he had a fall with a substantial bruise in the area of the wound. He says he saw orthopedics and they told him there was some muscle strain sometime it later this opened into a wound. He has a fairly substantial wound on the right posterior calf. Satellite areas around this including medially and posteriorly. He has not worn his stockings since the injury Past medical history; includes chronic renal failure secondary to diabetes with kidney transplant x2, atrial fibrillation, heart failure with preserved ejection fraction, coronary artery disease. ABIs on the right in our clinic were once again noncompressible 08/13/2019 on evaluation today patient appears to be doing decently well with regard to his wound compared to last week's evaluation. Unfortunately he is still having a lot of discomfort at this point which is I think in some part due to the 3 layer compression wrap which is a little bit stronger I think for him. When he was here before we actually utilized a Kerlix and Coban wrap which he states seemed to got a little bit better. Nonetheless I think we can probably drop back to this in light of the discomfort that he had. Nonetheless the pain was not really right around the wound itself as much as it was around the ankle in particular. The Iodoflex does seem to have done well for him As the wound is  appearing somewhat better today which is excellent news. 11/30; still complaining of a lot of pain. Apparently arterial studies I ordered 2 weeks ago are below. I do not believe we have an appointment with vein and vascular as of yet; ABI Findings: +---------+------------------+-----+----------+--------+ Right Rt Pressure (mmHg)IndexWaveform Comment  +---------+------------------+-----+----------+--------+ PTA >254 1.50 monophasic  +---------+------------------+-----+----------+--------+ DP >254 1.50 monophasic  +---------+------------------+-----+----------+--------+ Great T oe68 0.40 Abnormal   +---------+------------------+-----+----------+--------+ +---------+------------------+-----+----------+-------+ Left Lt Pressure (mmHg)IndexWaveform Comment +---------+------------------+-----+----------+-------+ Brachial 169     +---------+------------------+-----+----------+-------+ PTA >254 1.50 monophasic  +---------+------------------+-----+----------+-------+ DP >254 1.50 monophasic  +---------+------------------+-----+----------+-------+ Great T oe40 0.24 Abnormal   +---------+------------------+-----+----------+-------+ Pedal arteries appear hyperemic. Patient refused Brachial pressure in the right arm. Summary: Right: Resting right ankle-brachial index indicates noncompressible right lower extremity arteries. The right toe-brachial index is abnormal. Left: Resting left ankle-brachial index indicates noncompressible left lower  extremity arteries. The left toe-brachial index is abnormal. He is also having considerably more swelling in his left calf. This was not there when I last saw him 2 weeks ago. He tells me that some of the home health compression wraps have been slipping down and that may be the issue here however a month I am uncertain 25/6; sees vascular on December 22. Still complaining of a lot of pain. DVT study I did last  time was negative for DVT 12/14; still complaining of pain which if this is arterial is certainly claudication and rest keeps him uncomfortable at night. He has an appointment with vein and vascular on December 22. Wound surface is better using Iodoflex. Once the surface of this is satisfactory and we have exhausted the vascular route. Perhaps an advanced treatment option. He has a configuration of the venous ulceration although his arterial studies are not very good. The other issue is the patient has a transplanted kidney. This will make angiography difficult and challenging issue 12/21; still complaining of pain and drainage. We are using Iodoflex on the wound under compression. He sees Dr. Donnetta Hutching tomorrow to evaluate his noninvasive studies noted above. He has a transplanted kidney further complicating the options for angiography. 12/28; still using Iodoflex under compression. I have Dr. Luther Parody note from 12/22. he noted arterial studies revealing monophasic waveforms at the pedal vessels bilaterally and calcified vessels making the ABI unreliable. He did not comment on the reduced TBI's. He felt these were venous wounds based on the palpable dorsalis pedis pulse. 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 And they arranged for formal venous duplex with reflux studies in the next several weeks. Follow-up with either Scot Dock or Dr. Oneida Alar 09/24/2019 still using Iodoflex under compression. He has an appointment with Dr. Doren Custard on 1/7 with regards to his venous disease. The patient was not felt to have a primary arterial problem for the nonhealing of his wound. We did attempt to wrap him with 3 layer when he first came into the clinic he complained of a lot of pain in the ankle although this may have been because the dressing fell down somewhat. He has far too edema fluid in the right leg for a good prognosis about healing this wound He comes in today with an excoriation on the bottom part of his  right fifth toe. He thinks he may have done this taking off his clothes and that something got caught on the toe. There is no open wound however the toe itself is very painful 1/11; we are using Iodoflex under compression. The wound bed is clean. He went on to see Dr. Doren Custard on 09/27/2019 he again feels that the patient's arterial supply is adequate. He feels that he might benefit from right greater saphenous vein ablation for a venous ulcer. In the meantime the area that he was complaining about last week on the right fifth toe. An x-ray that I ordered showed marked soft tissue swelling along the distal aspect of the right foot but there was no evidence of osteomyelitis. He comes in today with the fifth toenail just about coming off. He has black eschar underneath the toe on the plantar aspect. The toe was swollen red and very painful. In the right setting this could be a significant soft tissue infection versus ischemia of the toe itself. He did not show this to Dr. Doren Custard 1/18; I am using Iodoflex under compression a large wound on the right posterior calf. He had his  right greater saphenous vein ablation by Dr. Doren Custard although I am not sure that is the only problem here. The right fifth toe which was possibly trauma 2 weeks ago continues to be exceptionally painful with a necrotic tip. Maybe not quite as swollen. I started him empirically on Augmentin 5/125 1 p.o. twice daily last week after discussing this with Dr. Loletha Grayer of nephrology. Perhaps somewhat better this week but not as good as I was hoping. A plain x-ray was negative. He comes in today with an area on the medial right calf that was blistered and now open. In looking at things he appears to be systemically fluid volume overloaded. He has a transplanted kidney. He states he takes his Lasix variably when he has appointments he tries not to take it the later I am really not certain if he takes this reliably. However he has far too much edema fluid  in the right leg to easily heal this wound and he appears to be developing blisters medially to form additional wounds 1/25; his CO2 angiogram was done by Dr. Doren Custard last week. This showed the proximal arteries all to be patent. On the right side the common femoral deep femoral superficial femoral popliteal anterior tibial and peroneal arteries were patent. The posterior tibial was occluded but reconstituted distally via collaterals from the peroneal artery he was felt he should have enough blood flow to heal his wounds including the right fifth toe. The right fifth toe looks better however he is still complaining of a lot of pain. The large area which is a venous ulceration posteriorly has a better looking surface I think we can switch to Hydrofera Blue today 2/1; the patient's original wounds on the right posterior medial calf has come down in width however superior to this he has new denuded areas and I am concerned we simply do not have enough edema control. He has already undergone a right greater saphenous ablation. He had a CO2 angiogram done by Dr. Doren Custard and the comment was that we should have enough blood flow to heal the venous wound however we simply do not seem to have enough edema control with 3 layer compression. The patient is status post kidney transplant although his exact renal function is not really clear. Nor am i sure what dose of diuretic he is supposed to be on. He also has the area on the right fifth toe which was unclear etiology but I think became secondarily infected I gave him 2 weeks of antibiotics for this and this seems to have settled down he still has a black eschar over the tip of the toe. X-ray was negative for fracture. He says he has a history of gout. 2/8; the patient's wound on the right posterior calf is about the same. The superficial area medially also about the same. He got a prescription for prednisoneo Gout after he developed erythema on the dorsal aspect of  his left great toe going along with the right fifth toe which has been problematic all along. He has not taken it because he is concerned about increasing CBGs. He has a transplanted kidney is already on prednisone 5 mg. He would not be a good candidate for NSAIDs. Perhaps colchicine. He is not aware of what his uric acid level is 2/15; his right posterior calf wound seems to be coming in in terms of width. Everything here looks fairly good. No mechanical debridement we have been using Hydrofera Blue. He has had an ablation by vein  and vascular. Felt to have adequate arterial supply to this area. The patient got prednisone last week from Dr. Angelique Holm of nephrology. At my urging he actually took it. The area on his left toe was a lot better. The right toe was not as painful but still erythematous with a wound at the tip. We have been using silver alginate here 2/22; right posterior calf seems to be gradually epithelializing. Still a fairly substantial wound. He still has an area on the tip of his right fifth toe which I think was gout related. This is gradually improving. We have been using Unna boots to wrap X-ray I did of the foot last time was again negative there was soft tissue irregularity about the distal fifth digit but no radiographic evidence of bone damage 3/1; right posterior calf seems to be gradually improving however there were areas of hyper granulation. We have been using Hydrofera Blue. The hyper granulation is mostly evident in the most superior finger shape projection of the wound itself. The area on the right fifth toe also was slough covered and required debridement. 3/8 continued improvement in the right posterior calf and the tip of the right fifth toe. We have been using Hydrofera Blue under compression he is changing the area to the toe 3/22; continued improvement in the right posterior calf. Unfortunately comes in today with a new skin tear in the mid anterior tibia area  this probably had something to do with changing his dressing home health I called and left this a message last week. 3/29; continued improvement in the a substantial wound on the right posterior calf. The skin tear anteriorly from last week is already healed on the tip of the right fifth toe there is still a nonviable area. 4/5; we have continued contraction of the substantial wound on the right posterior calf. We continue to have problems with the tip of the right fifth toe. We have been using Hydrofera Blue to both wound areas He is complaining about some discomfort in the Achilles area. T me he had surgery for what sounds like a torn Achilles about 10 years ago. ells 4/12; we have continued contraction of the substantial wound on the right posterior calf. Still an area on the tip of the right fifth toe. I changed him to silver collagen on the toe last week but I think home health continue to use Hydrofera Blue to both wound areas. 4/19; we continue to have contraction of the substantial wound on the right posterior calf however there continues to be hyper granulation. The area on the tip of the fifth toe is very small still some debris on the surface we have been using silver collagen 4/26; both wounds have contracted. I was hoping the fifth toe wound would close over but with probing there is still an open small hole here. 5/3; his wound on the right posterior calf which was his major area continues to contract looks healthy we have been using Hydrofera Blue. Using silver collagen to the fifth toe, not making a lot of progress here 5/10; right posterior calf wound continues to be smaller in terms of surface area and look healthy we have been using Hydrofera Blue here. The tip of the right fifth toe is still open requiring debridement. He has a new laceration on the right forearm he says he hit this on a microwave 6 days ago 02/11/20-The right fifth toe wound is closed, the right posterior calf  wound looks healthy with smaller dimensions  compared with last time, the right forearm area of laceration has a small blister which I try to open up with scalpel with very little fluid 6/7 the right fifth toe looks fine. Area on the right forearm is also closed. Right posterior calf wound continues to contract. Much smaller area remaining with healthy surface. We are using Hydrofera Blue under compression 6/21; area on the right posterior calf. There is only 1 small area in the most distal part of this. We have been using Hydrofera Blue under compression this is contracting surface looks healthy 6/28; right posterior calf this is healed. This was initially a hematoma. During the stay in this clinic he had a right greater saphenous vein ablation by Dr. Doren Custard of vein and vascular. He also had a difficult time with his right fifth toeo Infection versus gout. Very difficult time with this so that this has been closed for about 6 weeks. Electronic Signature(s) Signed: 03/17/2020 5:46:39 PM By: [name] MD Entered By: [name] on 03/17/2020 09:42:52 -------------------------------------------------------------------------------- Physical Exam Details Patient Name: Date of Service: DREXEL, IVEY HA M 03/17/2020 9:00 A M Medical Record Number: [id number] Patient Account Number: [id number] Date of Birth/Sex: Treating RN: [birth year] (74 y.o. [name] Primary Care Provider: Donetta [name] Other Clinician: Referring Provider: Treating Provider/Extender: [name] in Treatment: 26 Constitutional Patient is hypertensive.. Pulse regular and within target range for patient.Marland Kitchen Respirations regular, non-labored and within target range.. Temperature is normal and within the target range for the patient.Marland Kitchen Appears in no distress. Cardiovascular Pedal pulses are palpable. Notes Wound exam; right posterior calf is totally epithelialized. We have  excellent edema control Electronic Signature(s) Signed: 03/17/2020 5:46:39 PM By: [name] MD Entered By: [name] on 03/17/2020 09:43:48 -------------------------------------------------------------------------------- Physician Orders Details Patient Name: Date of Service: KOFI, MURRELL HA M 03/17/2020 9:00 A M Medical Record Number: [id number] Patient Account Number: [id number] Date of Birth/Sex: Treating RN: October 06, 1945 (74 y.o. 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 Discharge from [place] Skin Barriers/Peri-Wound Care Moisturizing lotion - both legs daily Edema Control Avoid standing for long periods of time Elevate legs to the level of the heart or above for 30 minutes daily and/or when sitting, a frequency of: - throughout the day Exercise regularly Support Garment 20-30 mm/Hg pressure to: - compression stocking to both legs daily. Apply first thing in the morning, remove at night before bed. [place] [facility] - [facility] may discontinue wound care - wound healed Electronic Signature(s) Signed: 03/17/2020 5:46:39 PM By: [name] MD Signed: 03/17/2020 6:01:25 PM By: [name] RN, BSN Entered By: [name] on 03/17/2020 09:35:56 -------------------------------------------------------------------------------- Problem List Details Patient Name: Date of Service: JDYN, PARKERSON HA M 03/17/2020 9:00 A  M Medical Record Number: [id number] Patient Account Number: [id number] Date of Birth/Sex: Treating RN: 05-17-1946 (74 y.o. [name] Primary Care Provider: Donetta [name] Other Clinician: Referring Provider: Treating Provider/Extender: [name] in Treatment: 32 Active Problems ICD-10 Encounter Code Description Active Date MDM Diagnosis S80.11XD Contusion  of right lower leg, subsequent encounter 08/06/2019 No Yes L97.212 Non-pressure chronic ulcer of right calf with fat layer exposed 08/06/2019 No Yes I87.321 Chronic venous hypertension (idiopathic) with inflammation of right lower 08/06/2019 No Yes extremity E11.622 Type 2 diabetes mellitus with other skin ulcer 08/06/2019 No Yes Inactive Problems ICD-10 Code Description Active Date Inactive Date L97.518 Non-pressure chronic ulcer of other part of right foot with other specified severity 10/01/2019 10/01/2019 L97.811 Non-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 12/10/2019 12/10/2019 S41.111D Laceration without foreign body of right upper arm, subsequent encounter 01/28/2020 01/28/2020 Resolved Problems Electronic Signature(s) Signed: 03/17/2020 5:46:39 PM By: [name] MD Entered By: [name] on 03/17/2020 09:40:27 -------------------------------------------------------------------------------- Progress Note Details Patient Name: Date of Service: [name] 03/17/2020 9:00 A M Medical Record Number: [id number] Patient Account Number: [id number] Date of Birth/Sex: Treating RN: 19-May-1946 (74 y.o. [name] Primary Care Provider: Donetta [name] Other Clinician: Referring Provider: Treating Provider/Extender: [name] in Treatment: 32 Subjective History of Present Illness (HPI) [name] HPI Description: 74 year old gentleman who was recently seen by his nephrologist Dr.  Donato [name], and noted to have a wound on his left lower extremity which was lacerated 2 months ago and now has reopened. The patient's left shin has a ulceration with some exudate but no evidence of infection and he was referred to [place] for further care as it was known that the patient has had some peripheral vascular disease in the past. Past medical history significant for chronic kidney disease, atrial fibrillation, diabetes mellitus,status post kidney transplant in 1983 and 2005, a week fistula graft placement, status post previous bowel surgery. 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 10/06/2015 -- x-ray of the left tibia and fibula shows no evidence of osteomyelitis. The patient has also had Doppler studies of his extremity and is awaiting the appointment with the vascular surgeon. We have not yet received these reports. 10/13/2015 -- lower extremity venous duplex reflux evaluation shows reflux in the left common femoral vein, left saphenofemoral junction and the proximal greater saphenous vein extending to the proximal calf. There is also reflux in the left proximal to mid small saphenous vein. Arterial duplex studies done showed the resting ABI was not applicable due to tibial artery medial calcification. 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 The right TBI was 1 which is normal and the left ABI was 1 which is normal. Patient has otherwise been doing fine and has been compliant with his dressings. 10/20/2015 -- He was seen by [name] recently for a vascular opinion on 10/15/2015. His left lower extremity venous insufficiency duplex study revealed GSV reflux,SS vein reflux and deep venous reflux in the common femoral vein. His ABIs were non compressible and his TBI on the right was 1.01 and on the left was 0.80. He was  asked to continue with the wound care with compressive therapy followed by EVLA of the left GS vein 3 months. He recommended 20-30 mm thigh- high compression stockings and the need for a three-month trial of this. The patient had an Unna boot applied at the vascular office but he could not tolerate this with a lot of pain and issues with his toes and hence came here on Friday for removal of  this and we reapplied a 2 layer compression. 11/10/2015 -- patient still has not purchased his 20-30 mm thigh-high compression stockings as prescribed by [name]. Readmission: 08/08/18 on evaluation today patient presents for readmission concerning a new injury to the left anterior lower extremity. He was previously seen in 2017 here in our clinic. He states that he has done fairly well since that point. Nonetheless he is having at this time some pain but states that he hit this on a table that fell over and actually struck his leg. This appears to have pulled back some of his skin which folded in on itself and is causing some difficulty as far as that is concerned. There does not appear to be any evidence of infection at this time. No fevers, chills, nausea, or vomiting noted at this time. He's been using dressings on his own currently without complication. 08/15/18 on evaluation today patient actually appears to be doing somewhat better in regard to his wound of the lower [name] when compared to the first visit last week. I had to do a much more extensive debridement at that time it does appear that I'm gonna have to perform some debridement today but it does not look to be as extensive by any means. Nonetheless fortunately he does not show any signs of infection he does have discomfort at this site. I believe based on what I'm seeing currently he may benefit from Iodoflex to help keep the wound bed clean. Patient tolerated therapy without complication. Upon evaluation today the patient actually appears to be  doing excellent in regard to his left lower extremity ulcer. This is much better than the previous two visits where he had a lot of necrotic tissue around the edge of the wound simply due to the fact that again there was a significant skin tear where the edge had been cleared away prior to reattaching and being able to heal appropriately. He seems to be doing much better at this point. 08/28/18 on evaluation today the patient's wound actually does appear to be showing signs of improvement. With that being said though he is improving he would likely note even greater improvement if we were able to sharply debride the wound. Nonetheless this caused him to much discomfort he tells me. 09/04/18 on evaluation today patient actually appears to be showing signs of improvement in regard to his left lower extremity ulcer. He has been tolerating the dressing changes including the wrap although he tells me at this point that the burning does last for a couple of days even with just the Iodoflex. I was afraid that this may been part of the issue that he was having with discomfort. It does seem to be the case. Nonetheless he shows no signs of evidence of infection at this time which is good news. No fevers chills noted [facility] to [name] 10/05/2018 This is a patient who was cared for in 2017 and again in the fall of this year at our sister clinic and [place]. He actually lives in [place] in [place]. We have been dealing with an apparent traumatic area on the left anterior tibial area. This is been present for the last several months. He was supposed to be using Iodoflex Kerlix and Coban however he was hospitalized from 09/05/2018 through 09/11/2018 with delirium secondary to pneumonia. Since then he is only been putting Vaseline gauze on this without compression. He also has a more recent skin tear on the dorsal right hand that  may have only happened in the last week. The patient had  arterial studies done in 2017 in January which was 3 years ago. At that point he had noncompressible ABIs but really quite good TBI's both normal. Triphasic waveforms on the right monophasic at the left posterior tibial but triphasic at the left dorsalis pedis. His ABIs in our clinic today were both noncompressible 1/23; the patient has wounds on the right dorsal hand just distal to the wrist and on the left anterior lower extremity. Both of these look very healthy he is using Hydrofera Blue 1/30; left anterior lower extremity wound much smaller. Healthy looking surface. The laceration area just distal to the wrist on the dorsal hand on the right is also just about closed I used Hydrofera Blue here 2/6; left anterior lower extremity wound is much smaller but still open. The laceration area just distal to the wrist on the dorsal hand is fully epithelialized. 2/13; the patient's anterior lower extremity wound is closed. The laceration just distal to the wrist on the dorsal hand is also fully epithelialized and closed. The patient has external compression stockings which I think are 20/30 READMISSION 08/06/2019 [name] is a 74 year old man with had several times previously in our clinic. He is a diabetic with a history of chronic renal insufficiency status post kidney transplant in 1983 and again in 2005. He was then in 2017 with a laceration on the left lower extremity. He was worked up at the time with arterial studies and reflux studies. The arterial studies showed ABIs to be noncompressible but TBI's were within normal limits. I do not have the reflux studies at the moment. He was also sent here in 2019 with a left lower extremity wound and then again in 2020 with left lower extremity trauma a skin tear on the wrist. He was discharged with 20/30 stockings identified from myself that that might not be enough compression. Nevertheless he states he was wearing these fairly reliably. In September  he had a fall with a substantial bruise in the area of the wound. He says he saw orthopedics and they told him there was some muscle strain sometime it later this opened into a wound. He has a fairly substantial wound on the right posterior calf. Satellite areas around this including medially and posteriorly. He has not worn his stockings since the injury Past medical history; includes chronic renal failure secondary to diabetes with kidney transplant x2, atrial fibrillation, heart failure with preserved ejection fraction, coronary artery disease. ABIs on the right in our clinic were once again noncompressible 08/13/2019 on evaluation today patient appears to be doing decently well with regard to his wound compared to last week's evaluation. Unfortunately he is still having a lot of discomfort at this point which is I think in some part due to the 3 layer compression wrap which is a little bit stronger I think for him. When he was here before we actually utilized a Kerlix and Coban wrap which he states seemed to got a little bit better. Nonetheless I think we can probably drop back to this in light of the discomfort that he had. Nonetheless the pain was not really right around the wound itself as much as it was around the ankle in particular. The Iodoflex does seem to have done well for him As the wound is appearing somewhat better today which is excellent news. 11/30; still complaining of a lot of pain. Apparently arterial studies I ordered 2 weeks ago are  below. I do not believe we have an appointment with vein and vascular as of yet; ABI Findings: +---------+------------------+-----+----------+--------+ Right Rt Pressure (mmHg)IndexWaveform Comment  +---------+------------------+-----+----------+--------+ PTA >254 1.50 monophasic  +---------+------------------+-----+----------+--------+ DP >254 1.50 monophasic  +---------+------------------+-----+----------+--------+ Great  T [id number] 0.40 Abnormal   +---------+------------------+-----+----------+--------+ +---------+------------------+-----+----------+-------+ Left Lt Pressure (mmHg)IndexWaveform Comment +---------+------------------+-----+----------+-------+ Brachial 169     +---------+------------------+-----+----------+-------+ PTA >254 1.50 monophasic  +---------+------------------+-----+----------+-------+ DP >254 1.50 monophasic  +---------+------------------+-----+----------+-------+ Great T oe40 0.24 Abnormal   +---------+------------------+-----+----------+-------+ Pedal arteries appear hyperemic. Patient refused Brachial pressure in the right arm. Summary: Right: Resting right ankle-brachial index indicates noncompressible right lower extremity arteries. The right toe-brachial index is abnormal. Left: Resting left ankle-brachial index indicates noncompressible left lower extremity arteries. The left toe-brachial index is abnormal. He is also having considerably more swelling in his left calf. This was not there when I last saw him 2 weeks ago. He tells me that some of the home health compression wraps have been slipping down and that may be the issue here however a month I am uncertain 03/4; sees vascular on December 22. Still complaining of a lot of pain. DVT study I did last time was negative for DVT 12/14; still complaining of pain which if this is arterial is certainly claudication and rest keeps him uncomfortable at night. He has an appointment with vein and vascular on December 22. Wound surface is better using Iodoflex. Once the surface of this is satisfactory and we have exhausted the vascular route. Perhaps an advanced treatment option. He has a configuration of the venous ulceration although his arterial studies are not very good. The other issue is the patient has a transplanted kidney. This will make angiography difficult and challenging issue 12/21; still  complaining of pain and drainage. We are using Iodoflex on the wound under compression. He sees Dr. Donnetta Hutching tomorrow to evaluate his noninvasive studies noted above. He has a transplanted kidney further complicating the options for angiography. 12/28; still using Iodoflex under compression. I have Dr. Luther Parody note from 12/22. he noted arterial studies revealing monophasic waveforms at the pedal vessels bilaterally and calcified vessels making the ABI unreliable. He did not comment on the reduced TBI's. He felt these were venous wounds based on the palpable dorsalis pedis pulse. 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 And they arranged for formal venous duplex with reflux studies in the next several weeks. Follow-up with either Scot Dock or Dr. Oneida Alar 09/24/2019 still using Iodoflex under compression. He has an appointment with Dr. Doren Custard on 1/7 with regards to his venous disease. The patient was not felt to have a primary arterial problem for the nonhealing of his wound. We did attempt to wrap him with 3 layer when he first came into the clinic he complained of a lot of pain in the ankle although this may have been because the dressing fell down somewhat. He has far too edema fluid in the right leg for a good prognosis about healing this wound He comes in today with an excoriation on the bottom part of his right fifth toe. He thinks he may have done this taking off his clothes and that something got caught on the toe. There is no open wound however the toe itself is very painful 1/11; we are using Iodoflex under compression. The wound bed is clean. He went on to see Dr. Doren Custard on 09/27/2019 he again feels that the patient's arterial supply is adequate. He feels that he might benefit from right greater saphenous vein ablation for a  venous ulcer. In the meantime the area that he was complaining about last week on the right fifth toe. An x-ray that I ordered showed marked soft tissue swelling along  the distal aspect of the right foot but there was no evidence of osteomyelitis. He comes in today with the fifth toenail just about coming off. He has black eschar underneath the toe on the plantar aspect. The toe was swollen red and very painful. In the right setting this could be a significant soft tissue infection versus ischemia of the toe itself. He did not show this to Dr. Doren Custard 1/18; I am using Iodoflex under compression a large wound on the right posterior calf. He had his right greater saphenous vein ablation by Dr. Doren Custard although I am not sure that is the only problem here. ooThe right fifth toe which was possibly trauma 2 weeks ago continues to be exceptionally painful with a necrotic tip. Maybe not quite as swollen. I started him empirically on Augmentin 5/125 1 p.o. twice daily last week after discussing this with Dr. Loletha Grayer of nephrology. Perhaps somewhat better this week but not as good as I was hoping. A plain x-ray was negative. He comes in today with an area on the medial right calf that was blistered and now open. In looking at things he appears to be systemically fluid volume overloaded. He has a transplanted kidney. He states he takes his Lasix variably when he has appointments he tries not to take it the later I am really not certain if he takes this reliably. However he has far too much edema fluid in the right leg to easily heal this wound and he appears to be developing blisters medially to form additional wounds 1/25; his CO2 angiogram was done by Dr. Doren Custard last week. This showed the proximal arteries all to be patent. On the right side the common femoral deep femoral superficial femoral popliteal anterior tibial and peroneal arteries were patent. The posterior tibial was occluded but reconstituted distally via collaterals from the peroneal artery he was felt he should have enough blood flow to heal his wounds including the right fifth toe. The right fifth toe looks better  however he is still complaining of a lot of pain. The large area which is a venous ulceration posteriorly has a better looking surface I think we can switch to Hydrofera Blue today 2/1; the patient's original wounds on the right posterior medial calf has come down in width however superior to this he has new denuded areas and I am concerned we simply do not have enough edema control. He has already undergone a right greater saphenous ablation. He had a CO2 angiogram done by Dr. Doren Custard and the comment was that we should have enough blood flow to heal the venous wound however we simply do not seem to have enough edema control with 3 layer compression. The patient is status post kidney transplant although his exact renal function is not really clear. Nor am i sure what dose of diuretic he is supposed to be on. He also has the area on the right fifth toe which was unclear etiology but I think became secondarily infected I gave him 2 weeks of antibiotics for this and this seems to have settled down he still has a black eschar over the tip of the toe. X-ray was negative for fracture. He says he has a history of gout. 2/8; the patient's wound on the right posterior calf is about the same. The superficial area  medially also about the same. He got a prescription for prednisoneo Gout after he developed erythema on the dorsal aspect of his left great toe going along with the right fifth toe which has been problematic all along. He has not taken it because he is concerned about increasing CBGs. He has a transplanted kidney is already on prednisone 5 mg. He would not be a good candidate for NSAIDs. Perhaps colchicine. He is not aware of what his uric acid level is 2/15; his right posterior calf wound seems to be coming in in terms of width. Everything here looks fairly good. No mechanical debridement we have been using Hydrofera Blue. He has had an ablation by vein and vascular. Felt to have adequate arterial supply  to this area. The patient got prednisone last week from Dr. Angelique Holm of nephrology. At my urging he actually took it. The area on his left toe was a lot better. The right toe was not as painful but still erythematous with a wound at the tip. We have been using silver alginate here 2/22; right posterior calf seems to be gradually epithelializing. Still a fairly substantial wound. He still has an area on the tip of his right fifth toe which I think was gout related. This is gradually improving. We have been using Unna boots to wrap X-ray I did of the foot last time was again negative there was soft tissue irregularity about the distal fifth digit but no radiographic evidence of bone damage 3/1; right posterior calf seems to be gradually improving however there were areas of hyper granulation. We have been using Hydrofera Blue. The hyper granulation is mostly evident in the most superior finger shape projection of the wound itself. ooThe area on the right fifth toe also was slough covered and required debridement. 3/8 continued improvement in the right posterior calf and the tip of the right fifth toe. We have been using Hydrofera Blue under compression he is changing the area to the toe 3/22; continued improvement in the right posterior calf. Unfortunately comes in today with a new skin tear in the mid anterior tibia area this probably had something to do with changing his dressing home health I called and left this a message last week. 3/29; continued improvement in the a substantial wound on the right posterior calf. The skin tear anteriorly from last week is already healed on the tip of the right fifth toe there is still a nonviable area. 4/5; we have continued contraction of the substantial wound on the right posterior calf. We continue to have problems with the tip of the right fifth toe. We have been using Hydrofera Blue to both wound areas He is complaining about some discomfort in the  Achilles area. T me he had surgery for what sounds like a torn Achilles about 10 years ago. ells 4/12; we have continued contraction of the substantial wound on the right posterior calf. ooStill an area on the tip of the right fifth toe. I changed him to silver collagen on the toe last week but I think home health continue to use Hydrofera Blue to both wound areas. 4/19; we continue to have contraction of the substantial wound on the right posterior calf however there continues to be hyper granulation. ooThe area on the tip of the fifth toe is very small still some debris on the surface we have been using silver collagen 4/26; both wounds have contracted. I was hoping the fifth toe wound would close over but with probing  there is still an open small hole here. 5/3; his wound on the right posterior calf which was his major area continues to contract looks healthy we have been using Hydrofera Blue. Using silver collagen to the fifth toe, not making a lot of progress here 5/10; right posterior calf wound continues to be smaller in terms of surface area and look healthy we have been using Hydrofera Blue here. The tip of the right fifth toe is still open requiring debridement. He has a new laceration on the right forearm he says he hit this on a microwave 6 days ago 02/11/20-The right fifth toe wound is closed, the right posterior calf wound looks healthy with smaller dimensions compared with last time, the right forearm area of laceration has a small blister which I try to open up with scalpel with very little fluid 6/7 the right fifth toe looks fine. Area on the right forearm is also closed. Right posterior calf wound continues to contract. Much smaller area remaining with healthy surface. We are using Hydrofera Blue under compression 6/21; area on the right posterior calf. There is only 1 small area in the most distal part of this. We have been using Hydrofera Blue under compression this  is contracting surface looks healthy 6/28; right posterior calf this is healed. This was initially a hematoma. During the stay in this clinic he had a right greater saphenous vein ablation by Dr. Doren Custard of vein and vascular. He also had a difficult time with his right fifth toeo Infection versus gout. Very difficult time with this so that this has been closed for about 6 weeks. Objective Constitutional Patient is hypertensive.. Pulse regular and within target range for patient.Marland Kitchen Respirations regular, non-labored and within target range.. Temperature is normal and within the target range for the patient.Marland Kitchen Appears in no distress. Vitals Time Taken: 9:20 AM, Height: 67 in, Weight: 190 lbs, BMI: 29.8, Temperature: 98.6 F, Pulse: 59 bpm, Respiratory Rate: 19 breaths/min, Blood Pressure: 151/71 mmHg. Cardiovascular Pedal pulses are palpable. General Notes: Wound exam; right posterior calf is totally epithelialized. We have excellent edema control Integumentary (Hair, Skin) Wound #3 status is Healed - Epithelialized. Original cause of wound was Trauma. The wound is located on the Right,Posterior Lower Leg. The wound measures 0cm length x 0cm width x 0cm depth; 0cm^2 area and 0cm^3 volume. There is no tunneling or undermining noted. There is a none present amount of drainage noted. The wound margin is flat and intact. There is no granulation within the wound bed. There is no necrotic tissue within the wound bed. 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 Apply first thing in the morning, remove at night before bed. Home Health: Discontinue home health - [facility] may discontinue wound care - wound healed 1. The patient is healed 2. He has compression stockings I went over this with him today 3. This was initially a traumatic wound with I think  a large hematoma. Very large wound on presentation. 4. The patient had a greater saphenous vein ablation on the right which helped progress towards healing. 5. He had a wound on the right great toe which is very painful. Think he was almost empirically treated for gout by his primary physician with prednisone. He was also worked up for arterial disease by vein and vascular not found to be present. Nevertheless this closed about 6 weeks ago. Electronic Signature(s) Signed: 03/17/2020 5:46:39 PM By: [name] MD Entered By: [name] on 03/17/2020 09:45:13 -------------------------------------------------------------------------------- SuperBill Details Patient Name: Date of Service: GODRIC, LAVELL HA M 03/17/2020 Medical Record Number: [id number] Patient Account Number: [id number] Date of Birth/Sex: Treating RN: 1946/03/18 (75 y.o. 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 E11.622 Type 2 diabetes mellitus with other skin ulcer Facility Procedures Physician  Procedures : CPT4 Code Description Modifier [id number] - WC PHYS LEVEL 3 - EST PT ICD-10 Diagnosis Description S80.11XD Contusion of right lower leg, subsequent encounter L97.212 Non-pressure chronic ulcer of right calf with fat layer exposed I87.321 Chronic  venous hypertension (idiopathic) with inflammation of right lower extremity E11.622 Type 2 diabetes mellitus with other skin ulcer Quantity: 1 Electronic Signature(s) Signed: 03/17/2020 5:46:39 PM By: [name] MD Signed: 03/17/2020 6:01:25 PM By: [name] RN, BSN Entered By: [name] on 03/17/2020 09:52:55

## 2020-03-17 NOTE — Progress Notes (Signed)
NAREN, BENALLY ([id number]) Visit Report for 03/17/2020 Arrival Information Details Patient Name: Date of Service: [name], [name] 03/17/2020 9:00 A M Medical Record Number: [id number] Patient Account Number: [id number] Date of Birth/Sex: Treating RN: 12/06/1945 (74 y.o. [name] Primary Care Arlester Keehan: [name] Other Clinician: Referring Krystianna Soth: Treating Ziaire Bieser/Extender: [name] in Treatment: 73 Visit Information History Since Last Visit Added or deleted any medications: No Patient Arrived: Ambulatory Any new allergies or adverse reactions: No Arrival Time: 09:20 Had a fall or experienced change in No Accompanied By: self activities of daily living that may affect Transfer Assistance: None risk of falls: Patient Identification Verified: Yes Signs or symptoms of abuse/neglect since last visito No Secondary Verification Process Completed: Yes Hospitalized since last visit: No Patient Requires Transmission-Based Precautions: No Implantable device outside of the clinic excluding No Patient Has Alerts: Yes cellular tissue based products placed in the center Patient Alerts: non compressable since last visit: Has Dressing in Place as Prescribed: Yes Has Compression in Place as Prescribed: Yes Pain Present Now: No Electronic Signature(s) Signed: 03/17/2020 5:58:35 PM By: Kela Millin Entered By: Kela Millin on 03/17/2020 09:20:26 -------------------------------------------------------------------------------- Clinic Level of Care Assessment Details Patient Name: Date of Service: [name], [name] HA M 03/17/2020 9:00 A M Medical Record Number: [id number] Patient Account Number: [id number] Date of Birth/Sex: Treating RN: October 22, 1945 (74 y.o. [name] Primary Care Kermit Arnette: [name] Other Clinician: Referring Thoma Paulsen: Treating Emy Angevine/Extender: [name]  in Treatment: 32 Clinic Level of Care Assessment Items TOOL 4 Quantity Score X- 1 0 Use when only an EandM is performed on FOLLOW-UP visit ASSESSMENTS - Nursing Assessment / Reassessment X- 1 10 Reassessment of Co-morbidities (includes updates in patient status) X- 1 5 Reassessment of Adherence to Treatment Plan ASSESSMENTS - Wound and Skin A ssessment / Reassessment X - Simple Wound Assessment / Reassessment - one wound 1 5 []  - 0 Complex Wound Assessment / Reassessment - multiple wounds []  - 0 Dermatologic / Skin Assessment (not related to wound area) ASSESSMENTS - Focused Assessment []  - 0 Circumferential Edema Measurements - multi extremities []  - 0 Nutritional Assessment / Counseling / Intervention X- 1 5 Lower Extremity Assessment (monofilament, tuning fork, pulses) []  - 0 Peripheral Arterial Disease Assessment (using hand held doppler) ASSESSMENTS - Ostomy and/or Continence Assessment and Care []  - 0 Incontinence Assessment and Management []  - 0 Ostomy Care Assessment and Management (repouching, etc.) PROCESS - Coordination of Care X - Simple Patient / Family Education for ongoing care 1 15 []  - 0 Complex (extensive) Patient / Family Education for ongoing care X- 1 10 Staff obtains Programmer, systems, Records, T Results / Process Orders est X- 1 10 Staff telephones HHA, Nursing Homes / Clarify orders / etc []  - 0 Routine Transfer to another Facility (non-emergent condition) []  - 0 Routine Hospital Admission (non-emergent condition) []  - 0 New Admissions / Biomedical engineer / Ordering NPWT Apligraf, etc. , []  - 0 Emergency Hospital Admission (emergent condition) X- 1 10 Simple Discharge Coordination []  - 0 Complex (extensive) Discharge Coordination PROCESS - Special Needs []  - 0 Pediatric / Minor Patient Management []  - 0 Isolation Patient Management []  - 0 Hearing / Language / Visual special needs []  - 0 Assessment of Community assistance  (transportation, D/C planning, etc.) []  - 0 Additional assistance / Altered mentation []  - 0 Support Surface(s) Assessment (bed, cushion, seat, etc.) INTERVENTIONS - Wound Cleansing / Measurement X - Simple  Wound Cleansing - one wound 1 5 []  - 0 Complex Wound Cleansing - multiple wounds X- 1 5 Wound Imaging (photographs - any number of wounds) []  - 0 Wound Tracing (instead of photographs) X- 1 5 Simple Wound Measurement - one wound []  - 0 Complex Wound Measurement - multiple wounds INTERVENTIONS - Wound Dressings []  - 0 Small Wound Dressing one or multiple wounds []  - 0 Medium Wound Dressing one or multiple wounds []  - 0 Large Wound Dressing one or multiple wounds []  - 0 Application of Medications - topical []  - 0 Application of Medications - injection INTERVENTIONS - Miscellaneous []  - 0 External ear exam []  - 0 Specimen Collection (cultures, biopsies, blood, body fluids, etc.) []  - 0 Specimen(s) / Culture(s) sent or taken to Lab for analysis []  - 0 Patient Transfer (multiple staff / Civil Service fast streamer / Similar devices) []  - 0 Simple Staple / Suture removal (25 or less) []  - 0 Complex Staple / Suture removal (26 or more) []  - 0 Hypo / Hyperglycemic Management (close monitor of Blood Glucose) []  - 0 Ankle / Brachial Index (ABI) - do not check if billed separately X- 1 5 Vital Signs Has the patient been seen at the hospital within the last three years: Yes Total Score: 90 Level Of Care: New/Established - Level 3 Electronic Signature(s) Signed: 03/17/2020 6:01:25 PM By: [name] RN, BSN Entered By: [name] on 03/17/2020 09:52:36 -------------------------------------------------------------------------------- Encounter Discharge Information Details Patient Name: Date of Service: [name] HA M 03/17/2020 9:00 A M Medical Record Number: [id number] Patient Account Number: [id number] Date of Birth/Sex: Treating RN: 07-May-1946 (74 y.o. [name] Primary Care Demarian Epps: [name] Other Clinician: Referring Mollee Neer: Treating Juel Bellerose/Extender: [name] in Treatment: 49 Encounter Discharge Information Items Discharge Condition: Stable Ambulatory Status: Ambulatory Discharge Destination: Home Transportation: Private Auto Accompanied By: alone Schedule Follow-up Appointment: Yes Clinical Summary of Care: Patient Declined Electronic Signature(s) Signed: 03/17/2020 6:01:25 PM By: [name] RN, BSN Entered By: [name] on 03/17/2020 11:56:45 -------------------------------------------------------------------------------- Lower Extremity Assessment Details Patient Name: Date of Service: [name], [name] HA M 03/17/2020 9:00 A M Medical Record Number: [id number] Patient Account Number: [id number] Date of Birth/Sex: Treating RN: Jan 19, 1946 (74 y.o. [name] Primary Care Lamoine Magallon: [name] Other Clinician: Referring Robecca Fulgham: Treating Flynn Gwyn/Extender: [name] in Treatment: 32 Edema Assessment Assessed: Shirlyn Goltz: No] Patrice Paradise: No] Edema: [Left: Ye] [Right: s] Calf Left: Right: Point of Measurement: 30 cm From Medial Instep cm 34.5 cm Ankle Left: Right: Point of Measurement: 10 cm From Medial Instep cm 22 cm Vascular Assessment Pulses: Dorsalis Pedis Palpable: [Right:Yes] Electronic Signature(s) Signed: 03/17/2020 5:58:35 PM By: Kela Millin Entered By: Kela Millin on 03/17/2020 09:23:27 -------------------------------------------------------------------------------- Multi Wound Chart Details Patient Name: Date of Service: [name] HA M 03/17/2020 9:00 A M Medical Record Number: [id number] Patient Account Number: [id number] Date of Birth/Sex: Treating RN: May 13, 1946 (74 y.o. [name] Primary Care Suriyah Vergara: [name] Other Clinician: Referring Deanette Tullius: Treating  Bertice Risse/Extender: [name] in Treatment: 32 Vital Signs Height(in): 67 Pulse(bpm): 24 Weight(lbs): 190 Blood Pressure(mmHg): 151/71 Body Mass Index(BMI): 30 Temperature(F): 98.6 Respiratory Rate(breaths/min): 19 Photos: [3:No Photos Right, Posterior Lower Leg] [N/A:N/A N/A] Wound Location: [3:Trauma] [N/A:N/A] Wounding Event: [3:Venous Leg Ulcer] [N/A:N/A] Primary Etiology: [3:Cataracts, Anemia, Arrhythmia,] [N/A:N/A] Comorbid History: [3:Congestive Heart Failure, Hypertension, Type II Diabetes, Gout, Neuropathy 06/06/2019] [N/A:N/A] Date Acquired: [3:32] [N/A:N/A] Weeks of Treatment: [3:Healed -  Epithelialized] [N/A:N/A] Wound Status: [3:0x0x0] [N/A:N/A] Measurements L x W x D (cm) [3:0] [N/A:N/A] A (cm) : rea [3:0] [N/A:N/A] Volume (cm) : [3:100.00%] [N/A:N/A] % Reduction in Area: [3:100.00%] [N/A:N/A] % Reduction in Volume: [3:Full Thickness Without Exposed] [N/A:N/A] Classification: [3:Support Structures None Present] [N/A:N/A] Exudate Amount: [3:Flat and Intact] [N/A:N/A] Wound Margin: [3:None Present (0%)] [N/A:N/A] Granulation Amount: [3:None Present (0%)] [N/A:N/A] Necrotic Amount: [3:Fascia: No] [N/A:N/A] Exposed Structures: [3:Fat Layer (Subcutaneous Tissue) Exposed: No Tendon: No Muscle: No Joint: No Bone: No Large (67-100%)] [N/A:N/A] Treatment Notes Electronic Signature(s) Signed: 03/17/2020 5:46:39 PM By: [name] MD Signed: 03/17/2020 6:01:25 PM By: [name] RN, BSN Entered By: [name] on 03/17/2020 09:40:35 -------------------------------------------------------------------------------- [place] Details Patient Name: Date of Service: [name], [name] HA M 03/17/2020 9:00 A M Medical Record Number: [id number] Patient Account Number: [id number] Date of Birth/Sex: Treating RN: 1946-02-25 (74 y.o. [name] Primary Care Gethsemane Fischler: [name] Other Clinician: Referring  Hiliary Osorto: Treating Lache Dagher/Extender: [name] in Treatment: 32 Active Inactive Electronic Signature(s) Signed: 03/17/2020 6:01:25 PM By: [name] RN, BSN Entered By: [name] on 03/17/2020 09:41:07 -------------------------------------------------------------------------------- Pain Assessment Details Patient Name: Date of Service: [name], [name] HA M 03/17/2020 9:00 A M Medical Record Number: [id number] Patient Account Number: [id number] Date of Birth/Sex: Treating RN: [birth year] (74 y.o. [name] Primary Care Bisma Klett: [name] Other Clinician: Referring Jenavieve Freda: Treating Jacy Brocker/Extender: [name] in Treatment: 32 Active Problems Location of Pain Severity and Description of Pain Patient Has Paino No Site Locations Pain Management and Medication Current Pain Management: Electronic Signature(s) Signed: 03/17/2020 5:58:35 PM By: Kela Millin Entered By: Kela Millin on 03/17/2020 09:20:56 -------------------------------------------------------------------------------- Patient/Caregiver Education Details Patient Name: Date of Service: [name] HA M 6/28/2021andnbsp9:00 A M Medical Record Number: [id number] Patient Account Number: [id number] Date of Birth/Gender: Treating RN: Dec 12, 1945 (74 y.o. [name] Primary Care Physician: [name] Other Clinician: Referring Physician: Treating Physician/Extender: [name] in Treatment: 85 Education Assessment Education Provided To: Patient Education Topics Provided Wound/Skin Impairment: Methods: Explain/Verbal Responses: State content correctly [employer]) Signed: 03/17/2020 6:01:25 PM By: [name] RN, BSN Entered By: [name] on 03/17/2020  09:51:17 -------------------------------------------------------------------------------- Wound Assessment Details Patient Name: Date of Service: [name], [name] HA M 03/17/2020 9:00 A M Medical Record Number: [id number] Patient Account Number: [id number] Date of Birth/Sex: Treating RN: 1946/01/01 (74 y.o. [name] Primary Care Yuliet Needs: [name] Other Clinician: Referring Burt Piatek: Treating Teva Bronkema/Extender: [name] in Treatment: 32 Wound Status Wound Number: 3 Primary Venous Leg Ulcer Etiology: Wound Location: Right, Posterior Lower Leg Wound Healed - Epithelialized Wounding Event: Trauma Status: Date Acquired: 06/06/2019 Comorbid Cataracts, Anemia, Arrhythmia, Congestive Heart Failure, Weeks Of Treatment: 32 History: Hypertension, Type II Diabetes, Gout, Neuropathy Clustered Wound: No Wound Measurements Length: (cm) Width: (cm) Depth: (cm) Area: (cm) Volume: (cm) 0 % Reduction in Area: 100% 0 % Reduction in Volume: 100% 0 Epithelialization: Large (67-100%) 0 Tunneling: No 0 Undermining: No Wound Description Classification: Full Thickness Without Exposed Support Structures Wound Margin: Flat and Intact Exudate Amount: None Present Foul Odor After Cleansing: No Slough/Fibrino No Wound Bed Granulation Amount: None Present (0%) Exposed Structure Necrotic Amount: None Present (0%) Fascia Exposed: No Fat Layer (Subcutaneous Tissue) Exposed: No Tendon Exposed: No Muscle Exposed: No Joint Exposed: No Bone Exposed: No Electronic Signature(s) Signed: 03/17/2020 5:58:35 PM By: Kela Millin Signed: 03/17/2020 6:01:25 PM  By: [name] RN, BSN Entered By: [name] on 03/17/2020 09:33:01 -------------------------------------------------------------------------------- Vitals Details Patient Name: Date of Service: [name], [name] HA M 03/17/2020 9:00 A M Medical Record Number: [id number] Patient  Account Number: [id number] Date of Birth/Sex: Treating RN: September 07, 1946 (74 y.o. [name] Primary Care Christina Waldrop: [name] Other Clinician: Referring Soni Kegel: Treating Newel Oien/Extender: [name] in Treatment: 32 Vital Signs Time Taken: 09:20 Temperature (F): 98.6 Height (in): 67 Pulse (bpm): 59 Weight (lbs): 190 Respiratory Rate (breaths/min): 19 Body Mass Index (BMI): 29.8 Blood Pressure (mmHg): 151/71 Reference Range: 80 - 120 mg / dl Electronic Signature(s) Signed: 03/17/2020 5:58:35 PM By: Kela Millin Entered By: Kela Millin on 03/17/2020 09:20:50

## 2020-03-25 ENCOUNTER — Non-Acute Institutional Stay (HOSPITAL_COMMUNITY)
Admission: RE | Admit: 2020-03-25 | Discharge: 2020-03-25 | Disposition: A | Discharge status: Home / Self Care | Payer: Medicare Other | Source: Ambulatory Visit | Attending: Internal Medicine | Admitting: Internal Medicine

## 2020-03-25 ENCOUNTER — Encounter (HOSPITAL_BASED_OUTPATIENT_CLINIC_OR_DEPARTMENT_OTHER): Payer: Medicare Other | Admitting: Internal Medicine

## 2020-03-25 ENCOUNTER — Other Ambulatory Visit: Payer: Self-pay

## 2020-03-25 DIAGNOSIS — D631 Anemia in chronic kidney disease: Secondary | ICD-10-CM | POA: Insufficient documentation

## 2020-03-25 DIAGNOSIS — N189 Chronic kidney disease, unspecified: Secondary | ICD-10-CM | POA: Insufficient documentation

## 2020-03-25 LAB — HEMOGLOBIN AND HEMATOCRIT, BLOOD
HCT: 34.5 % — ABNORMAL LOW (ref 39.0–52.0)
Hemoglobin: 9.9 g/dL — ABNORMAL LOW (ref 13.0–17.0)

## 2020-03-25 MED ORDER — DARBEPOETIN ALFA 200 MCG/0.4ML IJ SOSY
200.0000 ug | PREFILLED_SYRINGE | INTRAMUSCULAR | Status: DC
  Administered 2020-03-25: 200 ug via SUBCUTANEOUS
  Filled 2020-03-25: qty 0.4

## 2020-03-25 NOTE — Discharge Instructions (Signed)
Darbepoetin Alfa injection What is this medicine? DARBEPOETIN ALFA (dar be POE e tin AL fa) helps your body make more red blood cells. It is used to treat anemia caused by chronic kidney failure and chemotherapy. This medicine may be used for other purposes; ask your health care provider or pharmacist if you have questions. COMMON BRAND NAME(S): Aranesp What should I tell my health care provider before I take this medicine? They need to know if you have any of these conditions:  blood clotting disorders or history of blood clots  cancer patient not on chemotherapy  cystic fibrosis  heart disease, such as angina, heart failure, or a history of a heart attack  hemoglobin level of 12 g/dL or greater  high blood pressure  low levels of folate, iron, or vitamin B12  seizures  an unusual or allergic reaction to darbepoetin, erythropoietin, albumin, hamster proteins, latex, other medicines, foods, dyes, or preservatives  pregnant or trying to get pregnant  breast-feeding How should I use this medicine? This medicine is for injection into a vein or under the skin. It is usually given by a health care professional in a hospital or clinic setting. If you get this medicine at home, you will be taught how to prepare and give this medicine. Use exactly as directed. Take your medicine at regular intervals. Do not take your medicine more often than directed. It is important that you put your used needles and syringes in a special sharps container. Do not put them in a trash can. If you do not have a sharps container, call your pharmacist or healthcare provider to get one. A special MedGuide will be given to you by the pharmacist with each prescription and refill. Be sure to read this information carefully each time. Talk to your pediatrician regarding the use of this medicine in children. While this medicine may be used in children as young as 1 month of age for selected conditions, precautions do  apply. Overdosage: If you think you have taken too much of this medicine contact a poison control center or emergency room at once. NOTE: This medicine is only for you. Do not share this medicine with others. What if I miss a dose? If you miss a dose, take it as soon as you can. If it is almost time for your next dose, take only that dose. Do not take double or extra doses. What may interact with this medicine? Do not take this medicine with any of the following medications:  epoetin alfa This list may not describe all possible interactions. Give your health care provider a list of all the medicines, herbs, non-prescription drugs, or dietary supplements you use. Also tell them if you smoke, drink alcohol, or use illegal drugs. Some items may interact with your medicine. What should I watch for while using this medicine? Your condition will be monitored carefully while you are receiving this medicine. You may need blood work done while you are taking this medicine. This medicine may cause a decrease in vitamin B6. You should make sure that you get enough vitamin B6 while you are taking this medicine. Discuss the foods you eat and the vitamins you take with your health care professional. What side effects may I notice from receiving this medicine? Side effects that you should report to your doctor or health care professional as soon as possible:  allergic reactions like skin rash, itching or hives, swelling of the face, lips, or tongue  breathing problems  changes in   vision  chest pain  confusion, trouble speaking or understanding  feeling faint or lightheaded, falls  high blood pressure  muscle aches or pains  pain, swelling, warmth in the leg  rapid weight gain  severe headaches  sudden numbness or weakness of the face, arm or leg  trouble walking, dizziness, loss of balance or coordination  seizures (convulsions)  swelling of the ankles, feet, hands  unusually weak or  tired Side effects that usually do not require medical attention (report to your doctor or health care professional if they continue or are bothersome):  diarrhea  fever, chills (flu-like symptoms)  headaches  nausea, vomiting  redness, stinging, or swelling at site where injected This list may not describe all possible side effects. Call your doctor for medical advice about side effects. You may report side effects to FDA at 1-800-FDA-1088. Where should I keep my medicine? Keep out of the reach of children. Store in a refrigerator between 2 and 8 degrees C (36 and 46 degrees F). Do not freeze. Do not shake. Throw away any unused portion if using a single-dose vial. Throw away any unused medicine after the expiration date. NOTE: This sheet is a summary. It may not cover all possible information. If you have questions about this medicine, talk to your doctor, pharmacist, or health care provider.  2020 Elsevier/Gold Standard (2017-09-21 16:44:20)  

## 2020-03-25 NOTE — Progress Notes (Signed)
PATIENT CARE CENTER NOTE  Diagnosis:Anemia associated with Chronic Renal Failure   Provider:Coladonato, Broadus John, MD   Procedure:Aranesp injections   Note:Patient received sub-q Aranesp injectionin left arm. Tolerated injection well. No adverse reaction noted. Pre-injection Hemoglobin9.9and BP 130/67. Discharge paperwork given to patient. Alert, oriented and ambulatory at discharge.

## 2020-04-09 ENCOUNTER — Encounter: Payer: Self-pay | Admitting: Vascular Surgery

## 2020-04-09 ENCOUNTER — Other Ambulatory Visit: Payer: Self-pay | Admitting: *Deleted

## 2020-04-09 ENCOUNTER — Ambulatory Visit (INDEPENDENT_AMBULATORY_CARE_PROVIDER_SITE_OTHER): Payer: Medicare Other | Admitting: Vascular Surgery

## 2020-04-09 ENCOUNTER — Ambulatory Visit (HOSPITAL_COMMUNITY)
Admission: RE | Admit: 2020-04-09 | Discharge: 2020-04-09 | Disposition: A | Discharge status: Home / Self Care | Payer: Medicare Other | Source: Ambulatory Visit | Attending: Vascular Surgery | Admitting: Vascular Surgery

## 2020-04-09 ENCOUNTER — Other Ambulatory Visit: Payer: Self-pay

## 2020-04-09 VITALS — BP 185/89 | HR 52 | Temp 97.2°F | Resp 20 | Ht 68.0 in | Wt 199.0 lb

## 2020-04-09 DIAGNOSIS — I872 Venous insufficiency (chronic) (peripheral): Secondary | ICD-10-CM | POA: Diagnosis present

## 2020-04-09 DIAGNOSIS — I739 Peripheral vascular disease, unspecified: Secondary | ICD-10-CM

## 2020-04-09 DIAGNOSIS — I70261 Atherosclerosis of native arteries of extremities with gangrene, right leg: Secondary | ICD-10-CM | POA: Diagnosis not present

## 2020-04-09 NOTE — Progress Notes (Signed)
Patient name: [name] MRN: [id number] DOB: [birth year] Sex: male  REASON FOR VISIT:   Ulcer of the right second digit  HPI:   [name] is a pleasant 74 y.o. 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  I felt that he had adequate circulation to heal his venous stasis ulcers.  He presents now with a wound on his right index finger and is being followed by the hand surgeons.  He has been draining some material from the wound on his right index finger and has been soaking it in lukewarm Epson salt soaks.  He states that the wound has improved since he began doing that.  He denies fever or chills.  He is on po antibiotics.  He previously had a graft in his right arm that was ligated many years ago.  He is not on dialysis.  He has a functioning kidney transplant.  Current Outpatient Medications  Medication Sig Dispense Refill  . acetaminophen (TYLENOL) 500 MG tablet Take 1,000 mg by mouth every 8 (eight) hours as needed for moderate pain.     . Alcohol Swabs (B-D SINGLE USE SWABS REGULAR) PADS Use as directed    . aspirin 81 MG tablet Take 81 mg by mouth daily.      Marland Kitchen atorvastatin (LIPITOR) 10 MG tablet Take 10 mg by mouth daily.    Marland Kitchen BAYER CONTOUR NEXT TEST test strip Use as directed    . BD PEN NEEDLE NANO U/F 32G X 4 MM MISC Use as directed    . calcitRIOL (ROCALTROL) 0.25 MCG capsule Take 0.25 mcg by mouth daily. daily    . carvedilol (COREG) 6.25 MG tablet Take 1 tablet (6.25 mg total) by mouth 2 (two) times daily with a meal. 180 tablet 3  . cephALEXin (KEFLEX) 500 MG capsule Take 500 mg by mouth 2 (two) times daily.    . diphenhydrAMINE (BENADRYL) 25 mg capsule Take 1 capsule (25 mg total) by mouth at bedtime as needed (for sleep/allergies.). 30 capsule 0  .  furosemide (LASIX) 40 MG tablet Take 80 mg by mouth daily. Fluid. 30 tablet   . gabapentin (NEURONTIN) 100 MG capsule Take 100 mg by mouth 3 (three) times daily as needed (for nerve pain). Take three times/day as needed    . HUMALOG KWIKPEN 100 UNIT/ML KwikPen Inject 3-5 Units into the skin 3 (three) times daily.  6  . hydrALAZINE (APRESOLINE) 25 MG tablet Take 25 mg by mouth 3 (three) times daily.   4  . Insulin Lispro Prot & Lispro (HUMALOG 50/50 MIX) (50-50) 100 UNIT/ML Kwikpen Inject into the skin.    Marland Kitchen LANTUS SOLOSTAR 100 UNIT/ML Solostar Pen Inject 7-15 Units into the skin daily. If blood sugars are >300, use 15 units, <300 use 7 units    . latanoprost (XALATAN) 0.005 % ophthalmic solution Place 1 drop into both eyes at bedtime.    Marland Kitchen linagliptin (TRADJENTA) 5 MG TABS tablet Take 5 mg by mouth daily.      Marland Kitchen LORazepam (ATIVAN) 1 MG tablet Take 1 tablet prior to leaving house on day of procedure. Bring second tablet with you to office on day of procedure. 2 tablet 0  . losartan (COZAAR) 25 MG tablet Take 12.5 mg by mouth daily.    Marland Kitchen MICROLET  LANCETS MISC Use as directed    . mycophenolate (CELLCEPT) 500 MG tablet Take 500 mg by mouth 2 (two) times daily.     . predniSONE (DELTASONE) 5 MG tablet Take 5 mg by mouth daily.  2  . tacrolimus (PROGRAF) 1 MG capsule Take 1 mg by mouth 2 (two) times daily.     Marland Kitchen VASCEPA 1 g CAPS Take 4 g by mouth daily.     No current facility-administered medications for this visit.    REVIEW OF SYSTEMS:  [X]  denotes positive finding, [ ]  denotes negative finding Vascular    Leg swelling    Cardiac    Chest pain or chest pressure:    Shortness of breath upon exertion:    Short of breath when lying flat:    Irregular heart rhythm:    Constitutional    Fever or chills:     PHYSICAL EXAM:   Vitals:   04/09/20 1032  BP: (!) 185/89  Pulse: (!) 52  Resp: 20  Temp: (!) 97.2 F (36.2 C)  SpO2: 93%  Weight: 90.3 kg  Height: 5\' 8"  (1.727 m)    GENERAL:  The patient is a well-nourished male, in no acute distress. The vital signs are documented above. CARDIOVASCULAR: There is a regular rate and rhythm. PULMONARY: There is good air exchange bilaterally without wheezing or rales. VASCULAR: He has a palpable brachial and radial pulse on the right.  He has a radial, ulnar, and palmar arch signal with a Doppler on the right.  The signals are monophasic. He has palpable femoral pulses. EXTREMITIES: He has a wound on his right index finger with some drainage.     DATA:   DOPPLER RIGHT UPPER EXTREMITY: I have a independently interpreted his Doppler of the right upper extremity.  He had a previous right upper arm graft that was subsequently ligated.  Intraoperative arteriogram in 2012 showed occlusion of the radial and ulnar arteries.  He is noted by duplex to have abnormal flow in the radial and ulnar arteries on the right with noncompressible vessels.  He has abnormal digital waveforms in the second digit of the right hand.  MEDICAL ISSUES:   PERIPHERAL VASCULAR DISEASE: Based on his exam he likely has small vessel disease in the hand on the right.  He has monophasic signals in the radial and ulnar arteries today on his noninvasive studies.  Given that the wound is getting better I think we can hold off on arteriography.  However if the wound on the finger progresses then I think he would have to be scheduled for a right upper extremity arteriogram although I think it most of his his diseases distal.  Fortunately he is not a smoker.  He knows to call if the wound is not improving.  [name] [facility] [phone]

## 2020-04-09 NOTE — Progress Notes (Signed)
a 

## 2020-04-25 ENCOUNTER — Ambulatory Visit (HOSPITAL_COMMUNITY)
Admission: RE | Admit: 2020-04-25 | Discharge: 2020-04-25 | Disposition: A | Discharge status: Home / Self Care | Payer: Medicare Other | Source: Ambulatory Visit | Attending: Nephrology | Admitting: Nephrology

## 2020-04-25 ENCOUNTER — Other Ambulatory Visit: Payer: Self-pay

## 2020-04-25 DIAGNOSIS — N189 Chronic kidney disease, unspecified: Secondary | ICD-10-CM | POA: Diagnosis not present

## 2020-04-25 DIAGNOSIS — D631 Anemia in chronic kidney disease: Secondary | ICD-10-CM | POA: Diagnosis not present

## 2020-04-25 LAB — HEMOGLOBIN AND HEMATOCRIT, BLOOD
HCT: 32.8 % — ABNORMAL LOW (ref 39.0–52.0)
Hemoglobin: 9.5 g/dL — ABNORMAL LOW (ref 13.0–17.0)

## 2020-04-25 MED ORDER — DARBEPOETIN ALFA 200 MCG/0.4ML IJ SOSY
200.0000 ug | PREFILLED_SYRINGE | Freq: Once | INTRAMUSCULAR | Status: AC
  Administered 2020-04-25: 200 ug via SUBCUTANEOUS
  Filled 2020-04-25: qty 0.4

## 2020-04-25 NOTE — Progress Notes (Signed)
Diagnosis: Anemia associated with chronic renal failure   Provider: [name] MD   Procedure: Aranesp injection   Note: Patient received 200 mcg sub Q Aranesp injection in the left arm. No adverse reaction noted. Pre-injection hemoglobin was 9.5 and BP 143/61. Discharge instructions given to the patient. Alert, oriented and ambulatory at discharge.

## 2020-04-29 ENCOUNTER — Encounter (HOSPITAL_BASED_OUTPATIENT_CLINIC_OR_DEPARTMENT_OTHER): Payer: Medicare Other | Attending: Internal Medicine | Admitting: Internal Medicine

## 2020-04-29 ENCOUNTER — Other Ambulatory Visit: Payer: Self-pay

## 2020-04-29 DIAGNOSIS — L98491 Non-pressure chronic ulcer of skin of other sites limited to breakdown of skin: Secondary | ICD-10-CM | POA: Diagnosis present

## 2020-04-29 DIAGNOSIS — Z940 Kidney transplant status: Secondary | ICD-10-CM | POA: Insufficient documentation

## 2020-04-29 DIAGNOSIS — E1140 Type 2 diabetes mellitus with diabetic neuropathy, unspecified: Secondary | ICD-10-CM | POA: Diagnosis not present

## 2020-04-29 DIAGNOSIS — M10341 Gout due to renal impairment, right hand: Secondary | ICD-10-CM | POA: Diagnosis not present

## 2020-04-29 NOTE — Progress Notes (Signed)
[name] ([id number]) Visit Report for 04/29/2020 Allergy List Details Patient Name: Date of Service: [name] 04/29/2020 2:00 PM Medical Record Number: [id number] Patient Account Number: [id number] Date of Birth/Sex: Treating RN: [birth year] (74 y.o. [name] Primary Care [name]: [name] Other Clinician: Referring [name]: Treating [name]/Extender: [name] in Treatment: 0 Allergies Active Allergies Elavil Reaction: hallucination Severity: Moderate Allergy Notes Electronic Signature(s) Signed: 04/29/2020 5:26:04 PM By: [name] RN, BSN Entered By: [name] on 04/29/2020 14:22:17 -------------------------------------------------------------------------------- Arrival Information Details Patient Name: Date of Service: [name] HA M 04/29/2020 2:00 PM Medical Record Number: [id number] Patient Account Number: [id number] Date of Birth/Sex: Treating RN: 1945/10/02 (74 y.o. [name] Primary Care [name]: [name] Other Clinician: Referring [name]: Treating [name]/Extender: [name] in Treatment: 0 Visit Information Patient Arrived: Ambulatory Arrival Time: 14:18 Accompanied By: self Transfer Assistance: None Patient Identification Verified: Yes Secondary Verification Process Completed: Yes Patient Requires Transmission-Based Precautions: No Patient Has Alerts: No History Since Last Visit Electronic Signature(s) Signed: 04/29/2020 5:26:04 PM By: [name] RN, BSN Entered By: [name] on 04/29/2020 14:19:40 -------------------------------------------------------------------------------- Clinic Level of Care Assessment Details Patient Name: Date of Service: [name] HA M 04/29/2020 2:00 PM Medical Record Number: [id number] Patient Account Number: [id number] Date of Birth/Sex: Treating RN: 1946-05-31 (73  y.o. [name]) [name] Primary Care [name]: [name] Other Clinician: Referring [name]: Treating [name]/Extender: [name] in Treatment: 0 Clinic Level of Care Assessment Items TOOL 2 Quantity Score X- 1 0 Use when only an EandM is performed on the INITIAL visit ASSESSMENTS - Nursing Assessment / Reassessment X- 1 20 General Physical Exam (combine w/ comprehensive assessment (listed just below) when performed on new pt. evals) X- 1 25 Comprehensive Assessment (HX, ROS, Risk Assessments, Wounds Hx, etc.) ASSESSMENTS - Wound and Skin A ssessment / Reassessment X - Simple Wound Assessment / Reassessment - one wound 1 5 []  - 0 Complex Wound Assessment / Reassessment - multiple wounds []  - 0 Dermatologic / Skin Assessment (not related to wound area) ASSESSMENTS - Ostomy and/or Continence Assessment and Care []  - 0 Incontinence Assessment and Management []  - 0 Ostomy Care Assessment and Management (repouching, etc.) PROCESS - Coordination of Care X - Simple Patient / Family Education for ongoing care 1 15 []  - 0 Complex (extensive) Patient / Family Education for ongoing care X- 1 10 Staff obtains Programmer, systems, Records, T Results / Process Orders est []  - 0 Staff telephones HHA, Nursing Homes / Clarify orders / etc []  - 0 Routine Transfer to another Facility (non-emergent condition) []  - 0 Routine Hospital Admission (non-emergent condition) X- 1 15 New Admissions / Biomedical engineer / Ordering NPWT Apligraf, etc. , []  - 0 Emergency Hospital Admission (emergent condition) X- 1 10 Simple Discharge Coordination []  - 0 Complex (extensive) Discharge Coordination PROCESS - Special Needs []  - 0 Pediatric / Minor Patient Management []  - 0 Isolation Patient Management []  - 0 Hearing / Language / Visual special needs []  - 0 Assessment of Community assistance (transportation, D/C planning, etc.) []  - 0 Additional assistance  / Altered mentation []  - 0 Support Surface(s) Assessment (bed, cushion, seat, etc.) INTERVENTIONS - Wound Cleansing / Measurement X- 1 5 Wound Imaging (photographs - any number of wounds) []  - 0 Wound Tracing (instead of photographs) X- 1 5 Simple Wound Measurement - one wound []  - 0 Complex Wound Measurement -  multiple wounds X- 1 5 Simple Wound Cleansing - one wound []  - 0 Complex Wound Cleansing - multiple wounds INTERVENTIONS - Wound Dressings X - Small Wound Dressing one or multiple wounds 1 10 []  - 0 Medium Wound Dressing one or multiple wounds []  - 0 Large Wound Dressing one or multiple wounds []  - 0 Application of Medications - injection INTERVENTIONS - Miscellaneous []  - 0 External ear exam []  - 0 Specimen Collection (cultures, biopsies, blood, body fluids, etc.) []  - 0 Specimen(s) / Culture(s) sent or taken to Lab for analysis []  - 0 Patient Transfer (multiple staff / Civil Service fast streamer / Similar devices) []  - 0 Simple Staple / Suture removal (25 or less) []  - 0 Complex Staple / Suture removal (26 or more) []  - 0 Hypo / Hyperglycemic Management (close monitor of Blood Glucose) X- 1 15 Ankle / Brachial Index (ABI) - do not check if billed separately Has the patient been seen at the hospital within the last three years: Yes Total Score: 140 Level Of Care: New/Established - Level 4 Electronic Signature(s) Signed: 04/29/2020 5:05:11 PM By: Michael Coria RN Entered By: [name] on 04/29/2020 15:24:08 -------------------------------------------------------------------------------- Encounter Discharge Information Details Patient Name: Date of Service: [name] HA M 04/29/2020 2:00 PM Medical Record Number: [id number] Patient Account Number: [id number] Date of Birth/Sex: Treating RN: Dec 04, 1945 (74 y.o. [name] Primary Care [name]: [name] Other Clinician: Referring [name]: Treating [name]/Extender: [name] in Treatment: 0 Encounter Discharge Information Items Discharge Condition: Stable Ambulatory Status: Ambulatory Discharge Destination: Home Transportation: Private Auto Accompanied By: self Schedule Follow-up Appointment: Yes Clinical Summary of Care: Patient Declined Electronic Signature(s) Signed: 04/29/2020 5:12:21 PM By: [name] Entered By: [name] on 04/29/2020 16:06:38 -------------------------------------------------------------------------------- Lower Extremity Assessment Details Patient Name: Date of Service: JARNELL, CORDARO HA M 04/29/2020 2:00 PM Medical Record Number: [id number] Patient Account Number: [id number] Date of Birth/Sex: Treating RN: 1946/09/08 (74 y.o. [name] Primary Care Lemario Chaikin: [name] Other Clinician: Referring Sagar Tengan: Treating Addasyn Mcbreen/Extender: [name] in Treatment: 0 Electronic Signature(s) Signed: 04/29/2020 5:26:04 PM By: [name] RN, BSN Entered By: [name] on 04/29/2020 14:33:44 -------------------------------------------------------------------------------- [place] Details Patient Name: Date of Service: AQUILA, DELAUGHTER HA M 04/29/2020 2:00 PM Medical Record Number: [id number] Patient Account Number: [id number] Date of Birth/Sex: Treating RN: 11-Nov-1945 (73 y.o. [name]) [name] Primary Care Abelino Tippin: [name] Other Clinician: Referring Afsa Meany: Treating Joyous Gleghorn/Extender: [name] in Treatment: 0 Active Inactive Wound/Skin Impairment Nursing Diagnoses: Knowledge deficit related to ulceration/compromised skin integrity Goals: Patient/caregiver will verbalize understanding of skin care regimen Date Initiated: 04/29/2020 Target Resolution Date: 05/30/2020 Goal Status: Active Ulcer/skin breakdown will have a volume reduction of 30% by week 4 Date Initiated:  04/29/2020 Target Resolution Date: 05/30/2020 Goal Status: Active Interventions: Assess patient/caregiver ability to obtain necessary supplies Assess patient/caregiver ability to perform ulcer/skin care regimen upon admission and as needed Assess ulceration(s) every visit Notes: Electronic Signature(s) Signed: 04/29/2020 5:05:11 PM By: Michael Coria RN Entered By: [name] on 04/29/2020 15:04:03 -------------------------------------------------------------------------------- Pain Assessment Details Patient Name: Date of Service: GILLES, TRIMPE HA M 04/29/2020 2:00 PM Medical Record Number: [id number] Patient Account Number: [id number] Date of Birth/Sex: Treating RN: [birth year] (74 y.o. [name] Primary Care Olivea Sonnen: [name] Other Clinician: Referring Benz Vandenberghe: Treating Devanta Daniel/Extender: [name] in Treatment: 0 Active Problems Location of Pain Severity and Description  of Pain Patient Has Paino Yes Site Locations Pain Location: Pain Location: Pain in Ulcers With Dressing Change: Yes Duration of the Pain. Constant / Intermittento Constant Rate the pain. Current Pain Level: 3 Worst Pain Level: 10 Least Pain Level: 1 Character of Pain Describe the Pain: Aching Pain Management and Medication Current Pain Management: Medication: Yes Is the Current Pain Management Adequate: Adequate How does your wound impact your activities of daily livingo Sleep: Yes Bathing: No Appetite: No Relationship With Others: No Bladder Continence: No Emotions: No Bowel Continence: No Work: No Toileting: No Drive: No Dressing: No Hobbies: No Electronic Signature(s) Signed: 04/29/2020 5:26:04 PM By: [name] RN, BSN Entered By: [name] on 04/29/2020 14:37:08 -------------------------------------------------------------------------------- Patient/Caregiver Education Details Patient Name: Date of Service: HERMES, WAFER HA M 8/10/2021andnbsp2:00 PM Medical Record Number: [id number] Patient Account Number: [id number] Date of Birth/Gender: Treating RN: 1946-01-08 (73 y.o. Oval Linsey Primary Care Physician: [name] Other Clinician: Referring Physician: Treating Physician/Extender: [name] in Treatment: 0 Education Assessment Education Provided To: Patient Education Topics Provided Wound/Skin Impairment: Methods: Explain/Verbal Responses: State content correctly Electronic Signature(s) Signed: 04/29/2020 5:05:11 PM By: Michael Coria RN Entered By: [name] on 04/29/2020 15:04:13 -------------------------------------------------------------------------------- Wound Assessment Details Patient Name: Date of Service: VINCIENT, VANAMAN HA M 04/29/2020 2:00 PM Medical Record Number: [id number] Patient Account Number: [id number] Date of Birth/Sex: Treating RN: 02-07-1946 (74 y.o. [name] Primary Care [place] Weekly: [name] Other Clinician: Referring Royalti Schauf: Treating Raymona Boss/Extender: [name] in Treatment: 0 Wound Status Wound Number: 11 Primary Calcinosis Etiology: Wound Location: Hand - 2nd Digit Wound Open Wounding Event: Gradually Appeared Status: Date Acquired: 04/20/2020 Comorbid Cataracts, Anemia, Arrhythmia, Congestive Heart Failure, [name] Of Treatment: 0 History: Hypertension, Type II Diabetes, Gout, Neuropathy Clustered Wound: No Wound Measurements Length: (cm) 2.4 Width: (cm) 2.4 Depth: (cm) 0.2 Area: (cm) 4.524 Volume: (cm) 0.905 % Reduction in Area: 0% % Reduction in Volume: 0% Epithelialization: Small (1-33%) Tunneling: No Undermining: No Wound Description Classification: Full Thickness Without Exposed Support Structures Wound Margin: Flat and Intact Exudate Amount: Small Exudate Type: Serosanguineous Exudate Color: red, brown Foul Odor After Cleansing:  No Slough/Fibrino No Wound Bed Granulation Amount: Small (1-33%) Exposed Structure Necrotic Amount: Large (67-100%) Fascia Exposed: No Necrotic Quality: Eschar Fat Layer (Subcutaneous Tissue) Exposed: Yes Tendon Exposed: No Muscle Exposed: No Joint Exposed: No Bone Exposed: No Treatment Notes Wound #11 (Hand - 2nd Digit) 1. Cleanse With Wound Cleanser 3. Primary Dressing Applied Other primary dressing (specifiy in notes) 4. Secondary Dressing Dry Gauze Roll Gauze 5. Secured With Tape Notes Horticulturist, commercial) Signed: 04/29/2020 5:26:04 PM By: [name] RN, BSN Entered By: [name] on 04/29/2020 14:35:18 -------------------------------------------------------------------------------- [place] Details Patient Name: Date of Service: CASSEY, HURRELL HA M 04/29/2020 2:00 PM Medical Record Number: [id number] Patient Account Number: [id number] Date of Birth/Sex: Treating RN: September 26, 1945 (74 y.o. [name] Primary Care Eddie Koc: [name] Other Clinician: Referring Halley Kincer: Treating Leighton Luster/Extender: [name] in Treatment: 0 Vital Signs Time Taken: 14:19 Temperature (F): 98.6 Height (in): 68 Pulse (bpm): 69 Source: Stated Respiratory Rate (breaths/min): 18 Weight (lbs): 185 Blood Pressure (mmHg): 145/78 Source: Stated Capillary Blood Glucose (mg/dl): 88 Body Mass Index (BMI): 28.1 Reference Range: 80 - 120 mg / dl Notes glucose per pt report this am Electronic Signature(s) Signed: 04/29/2020 5:26:04 PM By: [name] RN, BSN Entered By: [name] on 04/29/2020 14:21:06

## 2020-04-29 NOTE — Progress Notes (Signed)
[name] ([id number]) Visit Report for 04/29/2020 Abuse/Suicide Risk Screen Details Patient Name: Date of Service: [name], [name] 04/29/2020 2:00 PM Medical Record Number: [id number] Patient Account Number: [id number] Date of Birth/Sex: Treating RN: [birth year] (74 y.o. [name] Primary Care Nayshawn Mesta: [name] Other Clinician: Referring Zalia Hautala: Treating Emelynn Rance/Extender: Mitzie Na in Treatment: 0 Abuse/Suicide Risk Screen Items Answer ABUSE RISK SCREEN: Has anyone close to you tried to hurt or harm you recentlyo No Do you feel uncomfortable with anyone in your familyo No Has anyone forced you do things that you didnt want to doo No Electronic Signature(s) Signed: 04/29/2020 5:26:04 PM By: [name] RN, BSN Entered By: [name] on 04/29/2020 14:25:49 -------------------------------------------------------------------------------- Activities of Daily Living Details Patient Name: Date of Service: PHAT, [name] 04/29/2020 2:00 PM Medical Record Number: [id number] Patient Account Number: [id number] Date of Birth/Sex: Treating RN: [birth year] (74 y.o. [name] Primary Care Charna Neeb: [name] Other Clinician: Referring Raela Bohl: Treating Gulianna Hornsby/Extender: Mitzie Na in Treatment: 0 Activities of Daily Living Items Answer Activities of Daily Living (Please select one for each item) Drive Automobile Completely Able T Medications ake Completely Able Use T elephone Completely Able Care for Appearance Completely Able Use T oilet Completely Able Bath / Shower Completely Able Dress Self Completely Able Feed Self Completely Able Walk Completely Able Get In / Out Bed Completely Able Housework Completely Able Prepare Meals Completely Able Handle Money Completely Able Shop for Self Completely Able Electronic Signature(s) Signed: 04/29/2020 5:26:04  PM By: [name] RN, BSN Entered By: [name] on 04/29/2020 14:26:09 -------------------------------------------------------------------------------- Education Screening Details Patient Name: Date of Service: [name] 04/29/2020 2:00 PM Medical Record Number: [id number] Patient Account Number: [id number] Date of Birth/Sex: Treating RN: 11-02-1945 (74 y.o. [name] Primary Care [place] Rabenold: [name] Other Clinician: Referring Jaxx Huish: Treating Piero Mustard/Extender: Mitzie Na in Treatment: 0 Primary Learner Assessed: Patient Learning Preferences/Education Level/Primary Language Learning Preference: Explanation, Demonstration, Printed Material Highest Education Level: College or Above Preferred Language: English Cognitive Barrier Language Barrier: No Translator Needed: No Memory Deficit: No Emotional Barrier: No Cultural/Religious Beliefs Affecting Medical Care: No Physical Barrier Impaired Vision: No Impaired Hearing: No Decreased Hand dexterity: No Knowledge/Comprehension Knowledge Level: High Comprehension Level: High Ability to understand written instructions: High Ability to understand verbal instructions: High Motivation Anxiety Level: Calm Cooperation: Cooperative Education Importance: Acknowledges Need Interest in Health Problems: Asks Questions Perception: Coherent Willingness to Engage in Self-Management High Activities: Readiness to Engage in Self-Management High Activities: Electronic Signature(s) Signed: 04/29/2020 5:26:04 PM By: [name] RN, BSN Entered By: [name] on 04/29/2020 14:26:52 -------------------------------------------------------------------------------- Fall Risk Assessment Details Patient Name: Date of Service: [name] 04/29/2020 2:00 PM Medical Record Number: [id number] Patient Account Number: [id number] Date of Birth/Sex: Treating  RN: 07-30-1946 (74 y.o. [name] Primary Care Zyier Dykema: [name] Other Clinician: Referring Malahki Gasaway: Treating Maleeah Crossman/Extender: Mitzie Na in Treatment: 0 Fall Risk Assessment Items Have you had 2 or more falls in the last 12 monthso 0 No Have you had any fall that resulted in injury in the last 12 monthso 0 No FALLS RISK SCREEN History of falling - immediate or within 3 months 0 No Secondary diagnosis (Do you have 2 or more medical diagnoseso) 0 No Ambulatory aid None/bed rest/wheelchair/nurse 0 Yes Crutches/cane/walker 0 No Furniture 0 No Intravenous therapy Access/Saline/Heparin Lock 0  No Gait/Transferring Normal/ bed rest/ wheelchair 0 Yes Weak (short steps with or without shuffle, stooped but able to lift head while walking, may seek 0 No support from furniture) Impaired (short steps with shuffle, may have difficulty arising from chair, head down, impaired 0 No balance) Mental Status Oriented to own ability 0 Yes Electronic Signature(s) Signed: 04/29/2020 5:26:04 PM By: [name] RN, BSN Entered By: [name] on 04/29/2020 14:32:46 -------------------------------------------------------------------------------- Foot Assessment Details Patient Name: Date of Service: [name] 04/29/2020 2:00 PM Medical Record Number: [id number] Patient Account Number: [id number] Date of Birth/Sex: Treating RN: 03/06/1946 (74 y.o. [name] Primary Care Niquan Charnley: [name] Other Clinician: Referring Kimorah Ridolfi: Treating Adric Wrede/Extender: Mitzie Na in Treatment: 0 Foot Assessment Items Site Locations + = Sensation present, - = Sensation absent, C = Callus, U = Ulcer R = Redness, W = Warmth, [name] = Maceration, PU = Pre-ulcerative lesion F = Fissure, S = Swelling, D = Dryness Assessment Right: Left: Other Deformity: No No Prior Foot Ulcer: No No Prior  Amputation: No No Charcot Joint: No No Ambulatory Status: Ambulatory Without Help Gait: Steady Electronic Signature(s) Signed: 04/29/2020 5:26:04 PM By: [name] RN, BSN Entered By: [name] on 04/29/2020 14:33:11 -------------------------------------------------------------------------------- Nutrition Risk Screening Details Patient Name: Date of Service: [name] 04/29/2020 2:00 PM Medical Record Number: [id number] Patient Account Number: [id number] Date of Birth/Sex: Treating RN: 02-28-1946 (74 y.o. [name] Primary Care Jewell Ryans: [name] Other Clinician: Referring Tiran Sauseda: Treating Eldredge Veldhuizen/Extender: Mitzie Na in Treatment: 0 Height (in): 68 Weight (lbs): 185 Body Mass Index (BMI): 28.1 Nutrition Risk Screening Items Score Screening NUTRITION RISK SCREEN: I have an illness or condition that made me change the kind and/or amount of food I eat 0 No I eat fewer than two meals per day 0 No I eat few fruits and vegetables, or milk products 0 No I have three or more drinks of beer, liquor or wine almost every day 0 No I have tooth or mouth problems that make it hard for me to eat 0 No I don't always have enough money to buy the food I need 0 No I eat alone most of the time 0 No I take three or more different prescribed or over-the-counter drugs a day 1 Yes Without wanting to, I have lost or gained 10 pounds in the last six months 0 No I am not always physically able to shop, cook and/or feed myself 0 No Nutrition Protocols Good Risk Protocol 0 No interventions needed Moderate Risk Protocol High Risk Proctocol Risk Level: Good Risk Score: 1 Electronic Signature(s) Signed: 04/29/2020 5:26:04 PM By: [name] RN, BSN Entered By: [name] on 04/29/2020 14:33:02

## 2020-04-29 NOTE — Progress Notes (Signed)
OZRO, RUSSETT ([id number]) Visit Report for 04/29/2020 Chief Complaint Document Details Patient Name: Date of Service: [name] 04/29/2020 2:00 PM Medical Record Number: [id number] Patient Account Number: [id number] Date of Birth/Sex: Treating RN: 1946-06-13 (74 y.o. [name]) Carlene Coria Primary Care Provider: [name] Other Clinician: Referring Provider: Treating Provider/Extender: Mitzie Na in Treatment: 0 Information Obtained from: Patient Chief Complaint 08/06/2019; patient returns to clinic with a fairly substantial wound on the right posterior calf 04/29/2020-Patient comes into the clinic today with right index finger swelling, pain, drainage with a small surgical open area Electronic Signature(s) Signed: 04/29/2020 3:17:10 PM By: Tobi Bastos MD, MBA Entered By: Tobi Bastos on 04/29/2020 15:17:09 -------------------------------------------------------------------------------- HPI Details Patient Name: Date of Service: [name] 04/29/2020 2:00 PM Medical Record Number: [id number] Patient Account Number: [id number] Date of Birth/Sex: Treating RN: [birth year] (74 y.o. [name]) Carlene Coria Primary Care Provider: [name] Other Clinician: Referring Provider: Treating Provider/Extender: Mitzie Na in Treatment: 0 History of Present Illness HPI Description: [name] HPI Description: 74 year old gentleman who was recently seen by his nephrologist [name], and noted to have a wound on his left lower extremity which was lacerated 2 months ago and now has reopened. The patient's left shin has a ulceration with some exudate but no evidence of infection and he was referred to [place] for further care as it was known that the patient has had some peripheral vascular disease in the past. Past medical history significant for chronic kidney disease, atrial fibrillation, diabetes  mellitus,status post kidney transplant in 1983 and 2005, a week fistula graft placement, status post previous bowel surgery. 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 10/06/2015 -- x-ray of the left tibia and fibula shows no evidence of osteomyelitis. The patient has also had Doppler studies of his extremity and is awaiting the appointment with the vascular surgeon. We have not yet received these reports. 10/13/2015 -- lower extremity venous duplex reflux evaluation shows reflux in the left common femoral vein, left saphenofemoral junction and the proximal greater saphenous vein extending to the proximal calf. There is also reflux in the left proximal to mid small saphenous vein. Arterial duplex studies done showed the resting ABI was not applicable due to tibial artery medial calcification. 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 The right TBI was 1 which is normal and the left ABI was 1 which is normal. Patient has otherwise been doing fine and has been compliant with his dressings. 10/20/2015 -- He was seen by [name] recently for a vascular opinion on 10/15/2015. His left lower extremity venous insufficiency duplex study revealed GSV reflux,SS vein reflux and deep venous reflux in the common femoral vein. His ABIs were non compressible and his TBI on the right was 1.01 and on the left was 0.80. He was asked to continue with the wound care with compressive therapy followed by EVLA of the left GS vein 3 months. He recommended 20-30 mm thigh- high compression stockings and the need for a three-month trial of this. The patient had an Unna boot applied at the vascular office but he could not tolerate this with a lot of pain and issues with his toes and hence came here on Friday for removal of this and we reapplied a  2 layer  compression. 11/10/2015 -- patient still has not purchased his 20-30 mm thigh-high compression stockings as prescribed by [name]. Readmission: 08/08/18 on evaluation today patient presents for readmission concerning a new injury to the left anterior lower extremity. He was previously seen in 2017 here in our clinic. He states that he has done fairly well since that point. Nonetheless he is having at this time some pain but states that he hit this on a table that fell over and actually struck his leg. This appears to have pulled back some of his skin which folded in on itself and is causing some difficulty as far as that is concerned. There does not appear to be any evidence of infection at this time. No fevers, chills, nausea, or vomiting noted at this time. He's been using dressings on his own currently without complication. 08/15/18 on evaluation today patient actually appears to be doing somewhat better in regard to his wound of the lower [name] when compared to the first visit last week. I had to do a much more extensive debridement at that time it does appear that I'[name] gonna have to perform some debridement today but it does not look to be as extensive by any means. Nonetheless fortunately he does not show any signs of infection he does have discomfort at this site. I believe based on what I'[name] seeing currently he may benefit from Iodoflex to help keep the wound bed clean. Patient tolerated therapy without complication. Upon evaluation today the patient actually appears to be doing excellent in regard to his left lower extremity ulcer. This is much better than the previous two visits where he had a lot of necrotic tissue around the edge of the wound simply due to the fact that again there was a significant skin tear where the edge had been cleared away prior to reattaching and being able to heal appropriately. He seems to be doing much better at this point. 08/28/18 on evaluation today the  patient's wound actually does appear to be showing signs of improvement. With that being said though he is improving he would likely note even greater improvement if we were able to sharply debride the wound. Nonetheless this caused him to much discomfort he tells me. 09/04/18 on evaluation today patient actually appears to be showing signs of improvement in regard to his left lower extremity ulcer. He has been tolerating the dressing changes including the wrap although he tells me at this point that the burning does last for a couple of days even with just the Iodoflex. I was afraid that this may been part of the issue that he was having with discomfort. It does seem to be the case. Nonetheless he shows no signs of evidence of infection at this time which is good news. No fevers chills noted ADMISSION to [name] 10/05/2018 This is a patient who was cared for in 2017 and again in the fall of this year at our sister clinic and [place]. He actually lives in [place] in [place]. We have been dealing with an apparent traumatic area on the left anterior tibial area. This is been present for the last several months. He was supposed to be using Iodoflex Kerlix and Coban however he was hospitalized from 09/05/2018 through 09/11/2018 with delirium secondary to pneumonia. Since then he is only been putting Vaseline gauze on this without compression. He also has a more recent skin tear on the dorsal right hand that may have only happened  in the last week. The patient had arterial studies done in 2017 in January which was 3 years ago. At that point he had noncompressible ABIs but really quite good TBI's both normal. Triphasic waveforms on the right monophasic at the left posterior tibial but triphasic at the left dorsalis pedis. His ABIs in our clinic today were both noncompressible 1/23; the patient has wounds on the right dorsal hand just distal to the wrist and on the left anterior  lower extremity. Both of these look very healthy he is using Hydrofera Blue 1/30; left anterior lower extremity wound much smaller. Healthy looking surface. The laceration area just distal to the wrist on the dorsal hand on the right is also just about closed I used Hydrofera Blue here 2/6; left anterior lower extremity wound is much smaller but still open. The laceration area just distal to the wrist on the dorsal hand is fully epithelialized. 2/13; the patient's anterior lower extremity wound is closed. The laceration just distal to the wrist on the dorsal hand is also fully epithelialized and closed. The patient has external compression stockings which I think are 20/30 READMISSION 08/06/2019 [name] is a 74 year old man with had several times previously in our clinic. He is a diabetic with a history of chronic renal insufficiency status post kidney transplant in 1983 and again in 2005. He was then in 2017 with a laceration on the left lower extremity. He was worked up at the time with arterial studies and reflux studies. The arterial studies showed ABIs to be noncompressible but TBI's were within normal limits. I do not have the reflux studies at the moment. He was also sent here in 2019 with a left lower extremity wound and then again in 2020 with left lower extremity trauma a skin tear on the wrist. He was discharged with 20/30 stockings identified from myself that that might not be enough compression. Nevertheless he states he was wearing these fairly reliably. In September he had a fall with a substantial bruise in the area of the wound. He says he saw orthopedics and they told him there was some muscle strain sometime it later this opened into a wound. He has a fairly substantial wound on the right posterior calf. Satellite areas around this including medially and posteriorly. He has not worn his stockings since the injury Past medical history; includes chronic renal failure secondary  to diabetes with kidney transplant x2, atrial fibrillation, heart failure with preserved ejection fraction, coronary artery disease. ABIs on the right in our clinic were once again noncompressible 08/13/2019 on evaluation today patient appears to be doing decently well with regard to his wound compared to last week's evaluation. Unfortunately he is still having a lot of discomfort at this point which is I think in some part due to the 3 layer compression wrap which is a little bit stronger I think for him. When he was here before we actually utilized a Kerlix and Coban wrap which he states seemed to got a little bit better. Nonetheless I think we can probably drop back to this in light of the discomfort that he had. Nonetheless the pain was not really right around the wound itself as much as it was around the ankle in particular. The Iodoflex does seem to have done well for him As the wound is appearing somewhat better today which is excellent news. 11/30; still complaining of a lot of pain. Apparently arterial studies I ordered 2 weeks ago are below. I do not  believe we have an appointment with vein and vascular as of yet; ABI Findings: +---------+------------------+-----+----------+--------+ Right Rt Pressure (mmHg)IndexWaveform Comment  +---------+------------------+-----+----------+--------+ PTA >254 1.50 monophasic  +---------+------------------+-----+----------+--------+ DP >254 1.50 monophasic  +---------+------------------+-----+----------+--------+ Great T oe68 0.40 Abnormal   +---------+------------------+-----+----------+--------+ +---------+------------------+-----+----------+-------+ Left Lt Pressure (mmHg)IndexWaveform Comment +---------+------------------+-----+----------+-------+ Brachial 169     +---------+------------------+-----+----------+-------+ PTA >254 1.50 monophasic   +---------+------------------+-----+----------+-------+ DP >254 1.50 monophasic  +---------+------------------+-----+----------+-------+ Great T oe40 0.24 Abnormal   +---------+------------------+-----+----------+-------+ Pedal arteries appear hyperemic. Patient refused Brachial pressure in the right arm. Summary: Right: Resting right ankle-brachial index indicates noncompressible right lower extremity arteries. The right toe-brachial index is abnormal. Left: Resting left ankle-brachial index indicates noncompressible left lower extremity arteries. The left toe-brachial index is abnormal. He is also having considerably more swelling in his left calf. This was not there when I last saw him 2 weeks ago. He tells me that some of the home health compression wraps have been slipping down and that may be the issue here however a month I am uncertain 02/6; sees vascular on December 22. Still complaining of a lot of pain. DVT study I did last time was negative for DVT 12/14; still complaining of pain which if this is arterial is certainly claudication and rest keeps him uncomfortable at night. He has an appointment with vein and vascular on December 22. Wound surface is better using Iodoflex. Once the surface of this is satisfactory and we have exhausted the vascular route. Perhaps an advanced treatment option. He has a configuration of the venous ulceration although his arterial studies are not very good. The other issue is the patient has a transplanted kidney. This will make angiography difficult and challenging issue 12/21; still complaining of pain and drainage. We are using Iodoflex on the wound under compression. He sees Dr. Donnetta Hutching tomorrow to evaluate his noninvasive studies noted above. He has a transplanted kidney further complicating the options for angiography. 12/28; still using Iodoflex under compression. I have Dr. Luther Parody note from 12/22. he noted arterial studies revealing  monophasic waveforms at the pedal vessels bilaterally and calcified vessels making the ABI unreliable. He did not comment on the reduced TBI's. He felt these were venous wounds based on the palpable dorsalis pedis pulse. 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 And they arranged for formal venous duplex with reflux studies in the next several weeks. Follow-up with either Scot Dock or Dr. Oneida Alar 09/24/2019 still using Iodoflex under compression. He has an appointment with Dr. Doren Custard on 1/7 with regards to his venous disease. The patient was not felt to have a primary arterial problem for the nonhealing of his wound. We did attempt to wrap him with 3 layer when he first came into the clinic he complained of a lot of pain in the ankle although this may have been because the dressing fell down somewhat. He has far too edema fluid in the right leg for a good prognosis about healing this wound He comes in today with an excoriation on the bottom part of his right fifth toe. He thinks he may have done this taking off his clothes and that something got caught on the toe. There is no open wound however the toe itself is very painful 1/11; we are using Iodoflex under compression. The wound bed is clean. He went on to see Dr. Doren Custard on 09/27/2019 he again feels that the patient's arterial supply is adequate. He feels that he might benefit from right greater saphenous vein ablation for a venous ulcer. In the  meantime the area that he was complaining about last week on the right fifth toe. An x-ray that I ordered showed marked soft tissue swelling along the distal aspect of the right foot but there was no evidence of osteomyelitis. He comes in today with the fifth toenail just about coming off. He has black eschar underneath the toe on the plantar aspect. The toe was swollen red and very painful. In the right setting this could be a significant soft tissue infection versus ischemia of the toe itself. He did not show  this to Dr. Doren Custard 1/18; I am using Iodoflex under compression a large wound on the right posterior calf. He had his right greater saphenous vein ablation by Dr. Doren Custard although I am not sure that is the only problem here. The right fifth toe which was possibly trauma 2 weeks ago continues to be exceptionally painful with a necrotic tip. Maybe not quite as swollen. I started him empirically on Augmentin 5/125 1 p.o. twice daily last week after discussing this with Dr. Loletha Grayer of nephrology. Perhaps somewhat better this week but not as good as I was hoping. A plain x-ray was negative. He comes in today with an area on the medial right calf that was blistered and now open. In looking at things he appears to be systemically fluid volume overloaded. He has a transplanted kidney. He states he takes his Lasix variably when he has appointments he tries not to take it the later I am really not certain if he takes this reliably. However he has far too much edema fluid in the right leg to easily heal this wound and he appears to be developing blisters medially to form additional wounds 1/25; his CO2 angiogram was done by Dr. Doren Custard last week. This showed the proximal arteries all to be patent. On the right side the common femoral deep femoral superficial femoral popliteal anterior tibial and peroneal arteries were patent. The posterior tibial was occluded but reconstituted distally via collaterals from the peroneal artery he was felt he should have enough blood flow to heal his wounds including the right fifth toe. The right fifth toe looks better however he is still complaining of a lot of pain. The large area which is a venous ulceration posteriorly has a better looking surface I think we can switch to Hydrofera Blue today 2/1; the patient's original wounds on the right posterior medial calf has come down in width however superior to this he has new denuded areas and I am concerned we simply do not have enough edema  control. He has already undergone a right greater saphenous ablation. He had a CO2 angiogram done by Dr. Doren Custard and the comment was that we should have enough blood flow to heal the venous wound however we simply do not seem to have enough edema control with 3 layer compression. The patient is status post kidney transplant although his exact renal function is not really clear. Nor am i sure what dose of diuretic he is supposed to be on. He also has the area on the right fifth toe which was unclear etiology but I think became secondarily infected I gave him 2 weeks of antibiotics for this and this seems to have settled down he still has a black eschar over the tip of the toe. X-ray was negative for fracture. He says he has a history of gout. 2/8; the patient's wound on the right posterior calf is about the same. The superficial area medially also about the  same. He got a prescription for prednisoneo Gout after he developed erythema on the dorsal aspect of his left great toe going along with the right fifth toe which has been problematic all along. He has not taken it because he is concerned about increasing CBGs. He has a transplanted kidney is already on prednisone 5 mg. He would not be a good candidate for NSAIDs. Perhaps colchicine. He is not aware of what his uric acid level is 2/15; his right posterior calf wound seems to be coming in in terms of width. Everything here looks fairly good. No mechanical debridement we have been using Hydrofera Blue. He has had an ablation by vein and vascular. Felt to have adequate arterial supply to this area. The patient got prednisone last week from Dr. Angelique Holm of nephrology. At my urging he actually took it. The area on his left toe was a lot better. The right toe was not as painful but still erythematous with a wound at the tip. We have been using silver alginate here 2/22; right posterior calf seems to be gradually epithelializing. Still a fairly substantial  wound. He still has an area on the tip of his right fifth toe which I think was gout related. This is gradually improving. We have been using Unna boots to wrap X-ray I did of the foot last time was again negative there was soft tissue irregularity about the distal fifth digit but no radiographic evidence of bone damage 3/1; right posterior calf seems to be gradually improving however there were areas of hyper granulation. We have been using Hydrofera Blue. The hyper granulation is mostly evident in the most superior finger shape projection of the wound itself. The area on the right fifth toe also was slough covered and required debridement. 3/8 continued improvement in the right posterior calf and the tip of the right fifth toe. We have been using Hydrofera Blue under compression he is changing the area to the toe 3/22; continued improvement in the right posterior calf. Unfortunately comes in today with a new skin tear in the mid anterior tibia area this probably had something to do with changing his dressing home health I called and left this a message last week. 3/29; continued improvement in the a substantial wound on the right posterior calf. The skin tear anteriorly from last week is already healed on the tip of the right fifth toe there is still a nonviable area. 4/5; we have continued contraction of the substantial wound on the right posterior calf. We continue to have problems with the tip of the right fifth toe. We have been using Hydrofera Blue to both wound areas He is complaining about some discomfort in the Achilles area. T me he had surgery for what sounds like a torn Achilles about 10 years ago. ells 4/12; we have continued contraction of the substantial wound on the right posterior calf. Still an area on the tip of the right fifth toe. I changed him to silver collagen on the toe last week but I think home health continue to use Hydrofera Blue to both wound areas. 4/19; we continue  to have contraction of the substantial wound on the right posterior calf however there continues to be hyper granulation. The area on the tip of the fifth toe is very small still some debris on the surface we have been using silver collagen 4/26; both wounds have contracted. I was hoping the fifth toe wound would close over but with probing there is still an  open small hole here. 5/3; his wound on the right posterior calf which was his major area continues to contract looks healthy we have been using Hydrofera Blue. Using silver collagen to the fifth toe, not making a lot of progress here 5/10; right posterior calf wound continues to be smaller in terms of surface area and look healthy we have been using Hydrofera Blue here. The tip of the right fifth toe is still open requiring debridement. He has a new laceration on the right forearm he says he hit this on a microwave 6 days ago 02/11/20-The right fifth toe wound is closed, the right posterior calf wound looks healthy with smaller dimensions compared with last time, the right forearm area of laceration has a small blister which I try to open up with scalpel with very little fluid 6/7 the right fifth toe looks fine. Area on the right forearm is also closed. Right posterior calf wound continues to contract. Much smaller area remaining with healthy surface. We are using Hydrofera Blue under compression 6/21; area on the right posterior calf. There is only 1 small area in the most distal part of this. We have been using Hydrofera Blue under compression this is contracting surface looks healthy 6/28; right posterior calf this is healed. This was initially a hematoma. During the stay in this clinic he had a right greater saphenous vein ablation by Dr. Doren Custard of vein and vascular. He also had a difficult time with his right fifth toeo Infection versus gout. Very difficult time with this so that this has been closed for about 6  weeks. ------------------- 04/29/20-Patient returns to the clinic this time with a right index finger swelling and pain been drainage, patient has been to the hand surgeon and vascular surgeon and ended up having a small IandD attempted in the office with expression of some gritty whitish material, patient also hit his hand and developed a small hematoma around this area. He also had vascular Dopplers done that show monophasic right radial right ulnar waveforms. He has follow-up appointments with vascular surgery as well as the hand surgeon. His appointment today with a hand surgeon he was told that he may lose the tip of his index finger if surgery is done to open up the entire area. He is reluctant to have any surgery and would like to try conservative measures He has tried epsom salt soaks with improvement then worsening , he has just finished a course of antibiotics, he does not remember the name Electronic Signature(s) Signed: 04/29/2020 3:22:10 PM By: Tobi Bastos MD, MBA Previous Signature: 04/29/2020 3:21:12 PM Version By: Tobi Bastos MD, MBA Entered By: Tobi Bastos on 04/29/2020 15:22:09 -------------------------------------------------------------------------------- Physical Exam Details Patient Name: Date of Service: NIZAR, CUTLER HA [name] 04/29/2020 2:00 PM Medical Record Number: [id number] Patient Account Number: [id number] Date of Birth/Sex: Treating RN: 1946-08-09 (73 y.o. [name] Primary Care Provider: [name] Other Clinician: Referring Provider: Treating Provider/Extender: Modena Slater Weeks in Treatment: 0 Constitutional alert and oriented x 3. sitting or standing blood pressure is within target range for patient.. supine blood pressure is within target range for patient.. pulse regular and within target range for patient.Marland Kitchen respirations regular, non-labored and within target range for patient.Marland Kitchen temperature within target  range for patient.. . . Well- nourished and well-hydrated in no acute distress. Eyes conjunctiva clear no eyelid edema noted. pupils equal round and reactive to light and accommodation. Ears, Nose, Mouth, and Throat no gross abnormality of  ear auricles or external auditory canals. normal hearing noted during conversation. mucus membranes moist. Neck supple with no LAD noted in anterior or posterior cervical chain. not enlarged. Respiratory normal breathing without difficulty. clear to auscultation bilaterally. Cardiovascular regular rate and rhythm with normal S1, S2. no bruits with no significant JVD. no clubbing, cyanosis, significant edema, <3 sec cap refill. Gastrointestinal (GI) soft, non-tender, non-distended, +BS. no hepatosplenomegaly. no ventral hernia noted. Lymphatic no cervical lymphadenopathy. no axillary lymphadenopathy. no inguinal lymphadenopathy. Musculoskeletal normal gait and posture. no significant deformity or arthritic changes, no loss or range of motion, no clubbing. Integumentary (Hair, Skin) normal hair distribution and pattern. skin pink, warm, dry. Neurological cranial nerves 2-12 intact. Patient has normal sensation in the feet bilaterally to light touch. Psychiatric this patient is able to make decisions and demonstrates good insight into disease process. Alert and Oriented x 3. pleasant and cooperative. Notes Right AV fistula Right radial pulse palpable Right index finger tip with small hematoma and slight open area through which some serosanguineous drainage is expressed, there is also whitish material noted below the surface but appears gritty Electronic Signature(s) Signed: 04/29/2020 3:23:55 PM By: Tobi Bastos MD, MBA Previous Signature: 04/29/2020 3:23:06 PM Version By: Tobi Bastos MD, MBA Entered By: Tobi Bastos on 04/29/2020 15:23:55 -------------------------------------------------------------------------------- Physician Orders  Details Patient Name: Date of Service: [name], [name] HA [name] 04/29/2020 2:00 PM Medical Record Number: [id number] Patient Account Number: [id number] Date of Birth/Sex: Treating RN: 01-Feb-1946 (73 y.o. [name] Primary Care Provider: [name] Other Clinician: Referring Provider: Treating Provider/Extender: Mitzie Na in Treatment: 0 Verbal / Phone Orders: No Diagnosis Coding Follow-up Appointments Return Appointment in 1 week. Dressing Change Frequency Change dressing every day. Wound Cleansing May shower and wash wound with soap and water. Primary Wound Dressing Wound #11 Hand - 2nd Digit Other: - betadine soaks - submerge finger in betadine for 2 to 3 min remove finger and let betadine dry Secondary Dressing Wound #11 Hand - 2nd Digit Kerlix/Rolled Gauze Dry Gauze [employer] uric acid - non healing wound with white crystals Electronic Signature(s) Signed: 04/29/2020 4:52:06 PM By: Tobi Bastos MD, MBA Signed: 04/29/2020 5:05:11 PM By: Carlene Coria RN Entered By: Carlene Coria on 04/29/2020 15:16:04 Prescription 04/29/2020 -------------------------------------------------------------------------------- Rachel Bo MD Patient Name: Provider: [birth year] [id number] Date of Birth: NPI#[name] [id number] Sex: DEA #: [phone] Phone #: License #: [place] Patient Address: [address] [place], [place] [address] [address] [place], [place] [address] [phone] Allergies Elavil Provider's Orders uric acid - non healing wound with white crystals Hand Signature: Date(s): Electronic Signature(s) Signed: 04/29/2020 4:52:06 PM By: Tobi Bastos MD, MBA Signed: 04/29/2020 5:05:11 PM By: Carlene Coria RN Entered By: Carlene Coria on 04/29/2020 15:16:05 -------------------------------------------------------------------------------- Problem  List Details Patient Name: Date of Service: BOONE, GEAR HA [name] 04/29/2020 2:00 PM Medical Record Number: [id number] Patient Account Number: [id number] Date of Birth/Sex: Treating RN: 08/17/1946 (73 y.o. 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 [name]) Carlene Coria Primary Care Provider: [name] Other Clinician: Referring Provider: Treating Provider/Extender: Mitzie Na in Treatment: 0 Subjective Chief Complaint Information obtained from Patient 08/06/2019; patient returns to clinic with a fairly substantial wound on the right posterior calf 04/29/2020-Patient comes into the clinic today with right index finger swelling, pain, drainage with a small surgical open area History of Present Illness (HPI) [place] HPI Description: 74 year old gentleman who was recently seen by his nephrologist [name], and noted to have a wound on his left lower extremity which was lacerated 2 months ago and now has reopened. The patient's left shin has a ulceration with some exudate but no evidence of infection  and he was referred to [place] for further care as it was known that the patient has had some peripheral vascular disease in the past. Past medical history significant for chronic kidney disease, atrial fibrillation, diabetes mellitus,status post kidney transplant in 1983 and 2005, a week fistula graft placement, status post previous bowel surgery. 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 10/06/2015 -- x-ray of the left tibia and fibula shows no evidence of osteomyelitis. The patient has also had Doppler studies of his extremity and is awaiting the appointment with the vascular surgeon. We have not yet received these reports. 10/13/2015 -- lower extremity venous duplex reflux evaluation shows reflux in the left common femoral vein, left saphenofemoral junction and the proximal greater saphenous vein extending to the proximal calf. There is also reflux in the left proximal to mid small saphenous vein. Arterial duplex studies done showed the resting ABI was not applicable due to tibial artery medial calcification. 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 The right TBI was 1 which is normal and the left ABI was 1 which is normal. Patient has otherwise been doing fine and has been compliant with his dressings. 10/20/2015 -- He was seen by [name] recently for a vascular opinion on 10/15/2015. His left lower extremity venous insufficiency duplex study revealed GSV reflux,SS vein reflux and deep venous reflux in the common femoral vein. His ABIs were non compressible and his TBI on the right was 1.01 and on the left was 0.80. He was asked to continue with the wound care with compressive therapy followed by EVLA of the left GS vein 3 months. He recommended 20-30 mm thigh- high compression stockings and the need for a three-month trial of  this. The patient had an Unna boot applied at the vascular office but he could not tolerate this with a lot of pain and issues with his toes and hence came here on Friday for removal of this and we reapplied a 2 layer compression. 11/10/2015 -- patient still has not purchased his 20-30 mm thigh-high compression stockings as prescribed by [name]. Readmission: 08/08/18 on evaluation today patient presents for readmission concerning a new injury to the left anterior lower extremity. He was previously seen in 2017 here in our clinic. He states that he has done fairly well since that point. Nonetheless he is having at this time some pain but states that he hit this on a  table that fell over and actually struck his leg. This appears to have pulled back some of his skin which folded in on itself and is causing some difficulty as far as that is concerned. There does not appear to be any evidence of infection at this time. No fevers, chills, nausea, or vomiting noted at this time. He's been using dressings on his own currently without complication. 08/15/18 on evaluation today patient actually appears to be doing somewhat better in regard to his wound of the lower [name] when compared to the first visit last week. I had to do a much more extensive debridement at that time it does appear that I'[name] gonna have to perform some debridement today but it does not look to be as extensive by any means. Nonetheless fortunately he does not show any signs of infection he does have discomfort at this site. I believe based on what I'[name] seeing currently he may benefit from Iodoflex to help keep the wound bed clean. Patient tolerated therapy without complication. Upon evaluation today the patient actually appears to be doing excellent in regard to his left lower extremity ulcer. This is much better than the previous two visits where he had a lot of necrotic tissue around the edge of the wound simply due to the fact that  again there was a significant skin tear where the edge had been cleared away prior to reattaching and being able to heal appropriately. He seems to be doing much better at this point. 08/28/18 on evaluation today the patient's wound actually does appear to be showing signs of improvement. With that being said though he is improving he would likely note even greater improvement if we were able to sharply debride the wound. Nonetheless this caused him to much discomfort he tells me. 09/04/18 on evaluation today patient actually appears to be showing signs of improvement in regard to his left lower extremity ulcer. He has been tolerating the dressing changes including the wrap although he tells me at this point that the burning does last for a couple of days even with just the Iodoflex. I was afraid that this may been part of the issue that he was having with discomfort. It does seem to be the case. Nonetheless he shows no signs of evidence of infection at this time which is good news. No fevers chills noted ADMISSION to [name] 10/05/2018 This is a patient who was cared for in 2017 and again in the fall of this year at our sister clinic and [facility]. He actually lives in [place] in [place]. We have been dealing with an apparent traumatic area on the left anterior tibial area. This is been present for the last several months. He was supposed to be using Iodoflex Kerlix and Coban however he was hospitalized from 09/05/2018 through 09/11/2018 with delirium secondary to pneumonia. Since then he is only been putting Vaseline gauze on this without compression. He also has a more recent skin tear on the dorsal right hand that may have only happened in the last week. The patient had arterial studies done in 2017 in January which was 3 years ago. At that point he had noncompressible ABIs but really quite good TBI's both normal. Triphasic waveforms on the right monophasic at the left  posterior tibial but triphasic at the left dorsalis pedis. His ABIs in our clinic today were both noncompressible 1/23; the patient has wounds on the right dorsal hand just distal to the wrist and on the  left anterior lower extremity. Both of these look very healthy he is using Hydrofera Blue 1/30; left anterior lower extremity wound much smaller. Healthy looking surface. The laceration area just distal to the wrist on the dorsal hand on the right is also just about closed I used Hydrofera Blue here 2/6; left anterior lower extremity wound is much smaller but still open. The laceration area just distal to the wrist on the dorsal hand is fully epithelialized. 2/13; the patient's anterior lower extremity wound is closed. The laceration just distal to the wrist on the dorsal hand is also fully epithelialized and closed. The patient has external compression stockings which I think are 20/30 READMISSION 08/06/2019 [name] is a 74 year old man with had several times previously in our clinic. He is a diabetic with a history of chronic renal insufficiency status post kidney transplant in 1983 and again in 2005. He was then in 2017 with a laceration on the left lower extremity. He was worked up at the time with arterial studies and reflux studies. The arterial studies showed ABIs to be noncompressible but TBI's were within normal limits. I do not have the reflux studies at the moment. He was also sent here in 2019 with a left lower extremity wound and then again in 2020 with left lower extremity trauma a skin tear on the wrist. He was discharged with 20/30 stockings identified from myself that that might not be enough compression. Nevertheless he states he was wearing these fairly reliably. In September he had a fall with a substantial bruise in the area of the wound. He says he saw orthopedics and they told him there was some muscle strain sometime it later this opened into a wound. He has a fairly  substantial wound on the right posterior calf. Satellite areas around this including medially and posteriorly. He has not worn his stockings since the injury Past medical history; includes chronic renal failure secondary to diabetes with kidney transplant x2, atrial fibrillation, heart failure with preserved ejection fraction, coronary artery disease. ABIs on the right in our clinic were once again noncompressible 08/13/2019 on evaluation today patient appears to be doing decently well with regard to his wound compared to last week's evaluation. Unfortunately he is still having a lot of discomfort at this point which is I think in some part due to the 3 layer compression wrap which is a little bit stronger I think for him. When he was here before we actually utilized a Kerlix and Coban wrap which he states seemed to got a little bit better. Nonetheless I think we can probably drop back to this in light of the discomfort that he had. Nonetheless the pain was not really right around the wound itself as much as it was around the ankle in particular. The Iodoflex does seem to have done well for him As the wound is appearing somewhat better today which is excellent news. 11/30; still complaining of a lot of pain. Apparently arterial studies I ordered 2 weeks ago are below. I do not believe we have an appointment with vein and vascular as of yet; ABI Findings: +---------+------------------+-----+----------+--------+ Right Rt Pressure (mmHg)IndexWaveform Comment  +---------+------------------+-----+----------+--------+ PTA >254 1.50 monophasic  +---------+------------------+-----+----------+--------+ DP >254 1.50 monophasic  +---------+------------------+-----+----------+--------+ Great T oe68 0.40 Abnormal   +---------+------------------+-----+----------+--------+ +---------+------------------+-----+----------+-------+ Left Lt Pressure (mmHg)IndexWaveform  Comment +---------+------------------+-----+----------+-------+ Brachial 169     +---------+------------------+-----+----------+-------+ PTA >254 1.50 monophasic  +---------+------------------+-----+----------+-------+ DP >254 1.50 monophasic  +---------+------------------+-----+----------+-------+ Great T oe40 0.24 Abnormal   +---------+------------------+-----+----------+-------+ Pedal arteries  appear hyperemic. Patient refused Brachial pressure in the right arm. Summary: Right: Resting right ankle-brachial index indicates noncompressible right lower extremity arteries. The right toe-brachial index is abnormal. Left: Resting left ankle-brachial index indicates noncompressible left lower extremity arteries. The left toe-brachial index is abnormal. He is also having considerably more swelling in his left calf. This was not there when I last saw him 2 weeks ago. He tells me that some of the home health compression wraps have been slipping down and that may be the issue here however a month I am uncertain 86/7; sees vascular on December 22. Still complaining of a lot of pain. DVT study I did last time was negative for DVT 12/14; still complaining of pain which if this is arterial is certainly claudication and rest keeps him uncomfortable at night. He has an appointment with vein and vascular on December 22. Wound surface is better using Iodoflex. Once the surface of this is satisfactory and we have exhausted the vascular route. Perhaps an advanced treatment option. He has a configuration of the venous ulceration although his arterial studies are not very good. The other issue is the patient has a transplanted kidney. This will make angiography difficult and challenging issue 12/21; still complaining of pain and drainage. We are using Iodoflex on the wound under compression. He sees Dr. Donnetta Hutching tomorrow to evaluate his noninvasive studies noted above. He has a transplanted  kidney further complicating the options for angiography. 12/28; still using Iodoflex under compression. I have Dr. Luther Parody note from 12/22. he noted arterial studies revealing monophasic waveforms at the pedal vessels bilaterally and calcified vessels making the ABI unreliable. He did not comment on the reduced TBI's. He felt these were venous wounds based on the palpable dorsalis pedis pulse. 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 And they arranged for formal venous duplex with reflux studies in the next several weeks. Follow-up with either Scot Dock or Dr. Oneida Alar 09/24/2019 still using Iodoflex under compression. He has an appointment with Dr. Doren Custard on 1/7 with regards to his venous disease. The patient was not felt to have a primary arterial problem for the nonhealing of his wound. We did attempt to wrap him with 3 layer when he first came into the clinic he complained of a lot of pain in the ankle although this may have been because the dressing fell down somewhat. He has far too edema fluid in the right leg for a good prognosis about healing this wound He comes in today with an excoriation on the bottom part of his right fifth toe. He thinks he may have done this taking off his clothes and that something got caught on the toe. There is no open wound however the toe itself is very painful 1/11; we are using Iodoflex under compression. The wound bed is clean. He went on to see Dr. Doren Custard on 09/27/2019 he again feels that the patient's arterial supply is adequate. He feels that he might benefit from right greater saphenous vein ablation for a venous ulcer. In the meantime the area that he was complaining about last week on the right fifth toe. An x-ray that I ordered showed marked soft tissue swelling along the distal aspect of the right foot but there was no evidence of osteomyelitis. He comes in today with the fifth toenail just about coming off. He has black eschar underneath the toe on the  plantar aspect. The toe was swollen red and very painful. In the right setting this could be  a significant soft tissue infection versus ischemia of the toe itself. He did not show this to Dr. Doren Custard 1/18; I am using Iodoflex under compression a large wound on the right posterior calf. He had his right greater saphenous vein ablation by Dr. Doren Custard although I am not sure that is the only problem here. ooThe right fifth toe which was possibly trauma 2 weeks ago continues to be exceptionally painful with a necrotic tip. Maybe not quite as swollen. I started him empirically on Augmentin 5/125 1 p.o. twice daily last week after discussing this with Dr. Loletha Grayer of nephrology. Perhaps somewhat better this week but not as good as I was hoping. A plain x-ray was negative. He comes in today with an area on the medial right calf that was blistered and now open. In looking at things he appears to be systemically fluid volume overloaded. He has a transplanted kidney. He states he takes his Lasix variably when he has appointments he tries not to take it the later I am really not certain if he takes this reliably. However he has far too much edema fluid in the right leg to easily heal this wound and he appears to be developing blisters medially to form additional wounds 1/25; his CO2 angiogram was done by Dr. Doren Custard last week. This showed the proximal arteries all to be patent. On the right side the common femoral deep femoral superficial femoral popliteal anterior tibial and peroneal arteries were patent. The posterior tibial was occluded but reconstituted distally via collaterals from the peroneal artery he was felt he should have enough blood flow to heal his wounds including the right fifth toe. The right fifth toe looks better however he is still complaining of a lot of pain. The large area which is a venous ulceration posteriorly has a better looking surface I think we can switch to Hydrofera Blue today 2/1; the  patient's original wounds on the right posterior medial calf has come down in width however superior to this he has new denuded areas and I am concerned we simply do not have enough edema control. He has already undergone a right greater saphenous ablation. He had a CO2 angiogram done by Dr. Doren Custard and the comment was that we should have enough blood flow to heal the venous wound however we simply do not seem to have enough edema control with 3 layer compression. The patient is status post kidney transplant although his exact renal function is not really clear. Nor am i sure what dose of diuretic he is supposed to be on. He also has the area on the right fifth toe which was unclear etiology but I think became secondarily infected I gave him 2 weeks of antibiotics for this and this seems to have settled down he still has a black eschar over the tip of the toe. X-ray was negative for fracture. He says he has a history of gout. 2/8; the patient's wound on the right posterior calf is about the same. The superficial area medially also about the same. He got a prescription for prednisoneo Gout after he developed erythema on the dorsal aspect of his left great toe going along with the right fifth toe which has been problematic all along. He has not taken it because he is concerned about increasing CBGs. He has a transplanted kidney is already on prednisone 5 mg. He would not be a good candidate for NSAIDs. Perhaps colchicine. He is not aware of what his uric acid level is  2/15; his right posterior calf wound seems to be coming in in terms of width. Everything here looks fairly good. No mechanical debridement we have been using Hydrofera Blue. He has had an ablation by vein and vascular. Felt to have adequate arterial supply to this area. The patient got prednisone last week from Dr. Angelique Holm of nephrology. At my urging he actually took it. The area on his left toe was a lot better. The right toe was not as  painful but still erythematous with a wound at the tip. We have been using silver alginate here 2/22; right posterior calf seems to be gradually epithelializing. Still a fairly substantial wound. He still has an area on the tip of his right fifth toe which I think was gout related. This is gradually improving. We have been using Unna boots to wrap X-ray I did of the foot last time was again negative there was soft tissue irregularity about the distal fifth digit but no radiographic evidence of bone damage 3/1; right posterior calf seems to be gradually improving however there were areas of hyper granulation. We have been using Hydrofera Blue. The hyper granulation is mostly evident in the most superior finger shape projection of the wound itself. ooThe area on the right fifth toe also was slough covered and required debridement. 3/8 continued improvement in the right posterior calf and the tip of the right fifth toe. We have been using Hydrofera Blue under compression he is changing the area to the toe 3/22; continued improvement in the right posterior calf. Unfortunately comes in today with a new skin tear in the mid anterior tibia area this probably had something to do with changing his dressing home health I called and left this a message last week. 3/29; continued improvement in the a substantial wound on the right posterior calf. The skin tear anteriorly from last week is already healed on the tip of the right fifth toe there is still a nonviable area. 4/5; we have continued contraction of the substantial wound on the right posterior calf. We continue to have problems with the tip of the right fifth toe. We have been using Hydrofera Blue to both wound areas He is complaining about some discomfort in the Achilles area. T me he had surgery for what sounds like a torn Achilles about 10 years ago. ells 4/12; we have continued contraction of the substantial wound on the right posterior  calf. ooStill an area on the tip of the right fifth toe. I changed him to silver collagen on the toe last week but I think home health continue to use Hydrofera Blue to both wound areas. 4/19; we continue to have contraction of the substantial wound on the right posterior calf however there continues to be hyper granulation. ooThe area on the tip of the fifth toe is very small still some debris on the surface we have been using silver collagen 4/26; both wounds have contracted. I was hoping the fifth toe wound would close over but with probing there is still an open small hole here. 5/3; his wound on the right posterior calf which was his major area continues to contract looks healthy we have been using Hydrofera Blue. Using silver collagen to the fifth toe, not making a lot of progress here 5/10; right posterior calf wound continues to be smaller in terms of surface area and look healthy we have been using Hydrofera Blue here. The tip of the right fifth toe is still open requiring debridement. He  has a new laceration on the right forearm he says he hit this on a microwave 6 days ago 02/11/20-The right fifth toe wound is closed, the right posterior calf wound looks healthy with smaller dimensions compared with last time, the right forearm area of laceration has a small blister which I try to open up with scalpel with very little fluid 6/7 the right fifth toe looks fine. Area on the right forearm is also closed. Right posterior calf wound continues to contract. Much smaller area remaining with healthy surface. We are using Hydrofera Blue under compression 6/21; area on the right posterior calf. There is only 1 small area in the most distal part of this. We have been using Hydrofera Blue under compression this is contracting surface looks healthy 6/28; right posterior calf this is healed. This was initially a hematoma. During the stay in this clinic he had a right greater saphenous vein ablation by  Dr. Doren Custard of vein and vascular. He also had a difficult time with his right fifth toeo Infection versus gout. Very difficult time with this so that this has been closed for about 6 weeks. ------------------- 04/29/20-Patient returns to the clinic this time with a right index finger swelling and pain been drainage, patient has been to the hand surgeon and vascular surgeon and ended up having a small IandD attempted in the office with expression of some gritty whitish material, patient also hit his hand and developed a small hematoma around this area. He also had vascular Dopplers done that show monophasic right radial right ulnar waveforms. He has follow-up appointments with vascular surgery as well as the hand surgeon. His appointment today with a hand surgeon he was told that he may lose the tip of his index finger if surgery is done to open up the entire area. He is reluctant to have any surgery and would like to try conservative measures He has tried epsom salt soaks with improvement then worsening , he has just finished a course of antibiotics, he does not remember the name Patient History Information obtained from Patient. Allergies Elavil (Severity: Moderate, Reaction: hallucination) Family History Diabetes - Siblings, Heart Disease - Father, Stroke - Mother,Maternal Grandparents, No family history of Cancer, Hereditary Spherocytosis, Hypertension, Kidney Disease, Lung Disease, Seizures, Thyroid Problems, Tuberculosis. Social History Former smoker - quit 30 yrs ago, Marital Status - Married, Alcohol Use - Never, Drug Use - No History, Caffeine Use - Daily - coffee. Medical History Eyes Patient has history of Cataracts Denies history of Glaucoma, Optic Neuritis Ear/Nose/Mouth/Throat Denies history of Chronic sinus problems/congestion, Middle ear problems Hematologic/Lymphatic Patient has history of Anemia Denies history of Hemophilia, Human Immunodeficiency Virus, Lymphedema, Sickle  Cell Disease Respiratory Denies history of Aspiration, Asthma, Chronic Obstructive Pulmonary Disease (COPD), Pneumothorax, Sleep Apnea, Tuberculosis Cardiovascular Patient has history of Arrhythmia - afib, Congestive Heart Failure, Hypertension Denies history of Angina, Coronary Artery Disease, Deep Vein Thrombosis, Hypotension, Myocardial Infarction, Peripheral Arterial Disease, Peripheral Venous Disease, Phlebitis Gastrointestinal Denies history of Cirrhosis , Colitis, Crohnoos, Hepatitis A, Hepatitis B, Hepatitis C Endocrine Patient has history of Type II Diabetes Denies history of Type I Diabetes Genitourinary Denies history of End Stage Renal Disease Immunological Denies history of Lupus Erythematosus, Raynaudoos, Scleroderma Integumentary (Skin) Denies history of History of Burn Musculoskeletal Patient has history of Gout Denies history of Rheumatoid Arthritis, Osteoarthritis, Osteomyelitis Neurologic Patient has history of Neuropathy Denies history of Dementia, Quadriplegia, Paraplegia, Seizure Disorder Oncologic Denies history of Received Chemotherapy, Received Radiation Psychiatric Denies history of Anorexia/bulimia,  Confinement Anxiety Hospitalization/Surgery History - kidney transplant x2. - av fistula right arm. - achilles tendon repair. - right arm surgery. Medical A Surgical History Notes nd Cardiovascular hyperlipidemia Genitourinary hx kidney transplant Oncologic hx skin CA Review of Systems (ROS) Constitutional Symptoms (General Health) Denies complaints or symptoms of Fatigue, Fever, Chills, Marked Weight Change. Eyes Complains or has symptoms of Glasses / Contacts - reading. Ear/Nose/Mouth/Throat Denies complaints or symptoms of Chronic sinus problems or rhinitis. Respiratory Denies complaints or symptoms of Chronic or frequent coughs, Shortness of Breath. Cardiovascular Denies complaints or symptoms of Chest pain. Gastrointestinal Denies  complaints or symptoms of Frequent diarrhea, Nausea, Vomiting. Endocrine Denies complaints or symptoms of Heat/cold intolerance. Genitourinary Denies complaints or symptoms of Frequent urination. Integumentary (Skin) Complains or has symptoms of Wounds - right 2nd finger. Musculoskeletal Denies complaints or symptoms of Muscle Pain, Muscle Weakness. Neurologic Complains or has symptoms of Numbness/parasthesias - feet. Psychiatric Denies complaints or symptoms of Claustrophobia, Suicidal. Objective Constitutional alert and oriented x 3. sitting or standing blood pressure is within target range for patient.. supine blood pressure is within target range for patient.. pulse regular and within target range for patient.Marland Kitchen respirations regular, non-labored and within target range for patient.Marland Kitchen temperature within target range for patient.. Well- nourished and well-hydrated in no acute distress. Vitals Time Taken: 2:19 PM, Height: 68 in, Source: Stated, Weight: 185 lbs, Source: Stated, BMI: 28.1, Temperature: 98.6 F, Pulse: 69 bpm, Respiratory Rate: 18 breaths/min, Blood Pressure: 145/78 mmHg, Capillary Blood Glucose: 88 mg/dl. General Notes: glucose per pt report this am Eyes conjunctiva clear no eyelid edema noted. pupils equal round and reactive to light and accommodation. Ears, Nose, Mouth, and Throat no gross abnormality of ear auricles or external auditory canals. normal hearing noted during conversation. mucus membranes moist. Neck supple with no LAD noted in anterior or posterior cervical chain. not enlarged. Respiratory normal breathing without difficulty. clear to auscultation bilaterally. Cardiovascular regular rate and rhythm with normal S1, S2. no bruits with no significant JVD. no clubbing, cyanosis, significant edema, Gastrointestinal (GI) soft, non-tender, non-distended, +BS. no hepatosplenomegaly. no ventral hernia noted. Lymphatic no cervical lymphadenopathy. no axillary  lymphadenopathy. no inguinal lymphadenopathy. Musculoskeletal normal gait and posture. no significant deformity or arthritic changes, no loss or range of motion, no clubbing. Neurological cranial nerves 2-12 intact. Patient has normal sensation in the feet bilaterally to light touch. Psychiatric this patient is able to make decisions and demonstrates good insight into disease process. Alert and Oriented x 3. pleasant and cooperative. General Notes: Right AV fistula Right radial pulse palpable Right index finger tip with small hematoma and slight open area through which some serosanguineous drainage is expressed, there is also whitish material noted below the surface but appears gritty Integumentary (Hair, Skin) normal hair distribution and pattern. skin pink, warm, dry. Wound #11 status is Open. Original cause of wound was Gradually Appeared. The wound is located on the Hand - 2nd Digit. The wound measures 2.4cm length x 2.4cm width x 0.2cm depth; 4.524cm^2 area and 0.905cm^3 volume. There is Fat Layer (Subcutaneous Tissue) Exposed exposed. There is no tunneling or undermining noted. There is a small amount of serosanguineous drainage noted. The wound margin is flat and intact. There is small (1-33%) granulation within the wound bed. There is a large (67-100%) amount of necrotic tissue within the wound bed including Eschar. 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 Dressing Change Frequency: Change dressing every day. Wound Cleansing: May shower and wash wound with soap and water. Primary Wound Dressing: Wound #11 Hand - 2nd Digit: Other: - betadine soaks - submerge finger in betadine for 2 to 3 min remove finger and let betadine dry Secondary Dressing: Wound #11 Hand - 2nd Digit: Kerlix/Rolled Gauze Dry  Gauze ordered were: uric acid - non healing wound with white crystals -Will start with iodine soaks daily -Patient asked not to expose to moisture excessively -We will try steroid burst taper over the next 5 days patient is on prednisone 5 mg daily maintenance dose -Return to clinic next week -Uric acid level to be checked -Possible etiology for this finger pulp swelling and inflammation may be gout Electronic Signature(s) Signed: 04/29/2020 3:25:16 PM By: Tobi Bastos MD, MBA Entered By: Tobi Bastos on 04/29/2020 15:25:15 -------------------------------------------------------------------------------- HxROS Details Patient Name: Date of Service: [name] HA [name] 04/29/2020 2:00 PM Medical Record Number: [id number] Patient Account Number: [id number] Date of Birth/Sex: Treating RN: 07-27-1946 (74 y.o. [name] Primary Care Provider: [name] Other Clinician: Referring Provider: Treating Provider/Extender: Mitzie Na in Treatment: 0 Information Obtained From Patient Constitutional Symptoms (General Health) Complaints and Symptoms: Negative for: Fatigue; Fever; Chills; Marked Weight Change Eyes Complaints and Symptoms: Positive for: Glasses / Contacts - reading Medical History: Positive for: Cataracts Negative for: Glaucoma; Optic Neuritis Ear/Nose/Mouth/Throat Complaints and Symptoms: Negative for: Chronic sinus problems or rhinitis Medical History: Negative for: Chronic sinus problems/congestion; Middle ear problems Respiratory Complaints and Symptoms: Negative for: Chronic or frequent coughs; Shortness of Breath Medical History: Negative for: Aspiration; Asthma; Chronic Obstructive Pulmonary Disease (COPD); Pneumothorax; Sleep Apnea; Tuberculosis Cardiovascular Complaints and Symptoms: Negative for: Chest pain Medical History: Positive for: Arrhythmia - afib; Congestive Heart Failure; Hypertension Negative  for: Angina; Coronary Artery Disease; Deep Vein Thrombosis; Hypotension; Myocardial Infarction; Peripheral Arterial Disease; Peripheral Venous Disease; Phlebitis Past Medical History Notes: hyperlipidemia Gastrointestinal Complaints and Symptoms: Negative for: Frequent diarrhea; Nausea; Vomiting Medical History: Negative for: Cirrhosis ; Colitis; Crohns; Hepatitis A; Hepatitis B; Hepatitis C Endocrine Complaints and Symptoms: Negative for: Heat/cold intolerance Medical History: Positive for: Type II Diabetes Negative for: Type I Diabetes Time with diabetes: 1983 Treated with: Insulin, Oral agents Blood sugar tested every day: Yes Tested : daily Blood sugar testing results: Breakfast: 75-120; Bedtime: 180 Genitourinary Complaints and Symptoms: Negative for: Frequent urination Medical History: Negative for: End Stage Renal Disease Past Medical History Notes: hx kidney transplant Integumentary (Skin) Complaints and Symptoms: Positive for: Wounds - right 2nd finger Medical History: Negative for: History of Burn Musculoskeletal Complaints and Symptoms: Negative for: Muscle Pain; Muscle Weakness Medical History: Positive for: Gout Negative for: Rheumatoid Arthritis; Osteoarthritis; Osteomyelitis Neurologic Complaints and Symptoms: Positive for: Numbness/parasthesias - feet Medical History: Positive for: Neuropathy Negative for: Dementia; Quadriplegia; Paraplegia; Seizure Disorder Psychiatric Complaints and Symptoms: Negative for: Claustrophobia; Suicidal Medical History: Negative for: Anorexia/bulimia; Confinement Anxiety Hematologic/Lymphatic Medical History: Positive for: Anemia Negative for: Hemophilia; Human Immunodeficiency Virus; Lymphedema; Sickle Cell Disease Immunological Medical History: Negative for: Lupus Erythematosus; Raynauds; Scleroderma Oncologic Medical History: Negative for: Received Chemotherapy; Received Radiation Past Medical History  Notes: hx skin CA HBO Extended History Items Eyes: Cataracts Immunizations Pneumococcal Vaccine: Received Pneumococcal Vaccination: Yes Implantable Devices None Hospitalization / Surgery History Type of Hospitalization/Surgery kidney transplant x2 av fistula right arm achilles tendon repair right arm surgery Family and Social History Cancer: No; Diabetes: Yes - Siblings; Heart Disease:  Yes - Father; Hereditary Spherocytosis: No; Hypertension: No; Kidney Disease: No; Lung Disease: No; Seizures: No; Stroke: Yes - Mother,Maternal Grandparents; Thyroid Problems: No; Tuberculosis: No; Former smoker - quit 30 yrs ago; Marital Status - Married; Alcohol Use: Never; Drug Use: No History; Caffeine Use: Daily - coffee; Financial Concerns: No; Food, Clothing or Shelter Needs: No; Support System Lacking: No; Transportation Concerns: No Engineer, maintenance) Signed: 04/29/2020 4:52:06 PM By: Tobi Bastos MD, MBA Signed: 04/29/2020 5:26:04 PM By: [name] RN, BSN Entered By: [name] on 04/29/2020 14:25:37 -------------------------------------------------------------------------------- SuperBill Details Patient Name: Date of Service: SILVESTER, REIERSON HA [name] 04/29/2020 Medical Record Number: [id number] Patient Account Number: [id number] Date of Birth/Sex: Treating RN: 1946-09-19 (73 y.o. [name] Primary Care Provider: [name] Other Clinician: Referring Provider: Treating Provider/Extender: Mitzie Na in Treatment: 0 Diagnosis Coding ICD-10 Codes Code Description P49.826 Gout due to renal impairment, right hand L98.491 Non-pressure chronic ulcer of skin of other sites limited to breakdown of skin Facility Procedures CPT4 Code: [id number] Description: 99214 - WOUND CARE VISIT-LEV 4 EST PT Modifier: Quantity: 1 Physician Procedures : CPT4 Code Description Modifier [id number] [place] PHYS LEVEL 3 NEW PT ICD-10 Diagnosis Description  M10.341 Gout due to renal impairment, right hand Quantity: 1 Electronic Signature(s) Signed: 04/29/2020 4:52:06 PM By: Tobi Bastos MD, MBA Signed: 04/29/2020 5:05:11 PM By: Carlene Coria RN Previous Signature: 04/29/2020 3:25:30 PM Version By: Tobi Bastos MD, MBA Previous Signature: 04/29/2020 3:25:30 PM Version By: Tobi Bastos MD, MBA Entered By: Carlene Coria on 04/29/2020 16:19:20

## 2020-05-05 ENCOUNTER — Other Ambulatory Visit (HOSPITAL_COMMUNITY)
Admission: RE | Admit: 2020-05-05 | Discharge: 2020-05-05 | Disposition: A | Discharge status: Home / Self Care | Payer: Medicare Other | Source: Other Acute Inpatient Hospital | Attending: Internal Medicine | Admitting: Internal Medicine

## 2020-05-05 ENCOUNTER — Encounter (HOSPITAL_BASED_OUTPATIENT_CLINIC_OR_DEPARTMENT_OTHER): Payer: Medicare Other | Admitting: Internal Medicine

## 2020-05-05 DIAGNOSIS — L98491 Non-pressure chronic ulcer of skin of other sites limited to breakdown of skin: Secondary | ICD-10-CM | POA: Diagnosis present

## 2020-05-05 NOTE — Progress Notes (Signed)
[name], [name] ([id number]) Visit Report for 05/05/2020 Debridement Details Patient Name: Date of Service: [name], [name] 05/05/2020 8:45 A M Medical Record Number: [id number] Patient Account Number: [id number] Date of Birth/Sex: Treating RN: [birth year] (74 y.o. [name] Primary Care Provider: [name] Other Clinician: Referring Provider: Treating Provider/Extender: [name] in Treatment: 0 Debridement Performed for Assessment: Wound #11 Hand - 2nd Digit Performed By: Physician Ricard Dillon., MD Debridement Type: Debridement Level of Consciousness (Pre-procedure): Awake and Alert Pre-procedure Verification/Time Out Yes - 09:20 Taken: Start Time: 09:20 Pain Control: Lidocaine Injectable : 1 T Area Debrided (L x W): otal 1 (cm) x 1 (cm) = 1 (cm) Tissue and other material debrided: Viable, Non-Viable, Subcutaneous, Skin: Epidermis Level: Skin/Subcutaneous Tissue Debridement Description: Excisional Instrument: Blade, Curette Specimen: Swab, Number of Specimens T aken: 1 Bleeding: Minimum Hemostasis Achieved: Pressure End Time: 09:21 Procedural Pain: 4 Post Procedural Pain: 0 Response to Treatment: Procedure was tolerated well Level of Consciousness (Post- Awake and Alert procedure): Post Debridement Measurements of Total Wound Length: (cm) 2.3 Width: (cm) 2 Depth: (cm) 0.2 Volume: (cm) 0.723 Character of Wound/Ulcer Post Debridement: Stable Post Procedure Diagnosis Same as Pre-procedure Electronic Signature(s) Signed: 05/05/2020 5:14:32 PM By: [name] RN, BSN Signed: 05/05/2020 5:53:04 PM By: [name] MD Entered By: [name] on 05/05/2020 09:38:13 -------------------------------------------------------------------------------- HPI Details Patient Name: Date of Service: [name], [name] HA M 05/05/2020 8:45 A M Medical Record Number: [id number] Patient Account Number: [id number] Date of  Birth/Sex: Treating RN: 1945/10/06 (74 y.o. [name] Primary Care Provider: [name] Other Clinician: Referring Provider: Treating Provider/Extender: [name] in Treatment: 0 History of Present Illness HPI Description: [name] HPI Description: 74 year old gentleman who was recently seen by his nephrologist [name], and noted to have a wound on his left lower extremity which was lacerated 2 months ago and now has reopened. The patient's left shin has a ulceration with some exudate but no evidence of infection and he was referred to [place] for further care as it was known that the patient has had some peripheral vascular disease in the past. Past medical history significant for chronic kidney disease, atrial fibrillation, diabetes mellitus,status post kidney transplant in 1983 and 2005, a week fistula graft placement, status post previous bowel surgery. 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 10/06/2015 -- x-ray of the left tibia and fibula shows no evidence of osteomyelitis. The patient has also had Doppler studies of his extremity and is awaiting the appointment with the vascular surgeon. We have not yet received these reports. 10/13/2015 -- lower extremity venous duplex reflux evaluation shows reflux in the left common femoral vein, left saphenofemoral junction and the proximal greater saphenous vein extending to the proximal calf. There is also reflux in the left proximal to mid small saphenous vein. Arterial duplex studies done showed the resting ABI was not applicable due to tibial artery medial calcification. 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 The right TBI was 1 which is normal and the left ABI was 1 which is normal. Patient has otherwise been doing fine and  has been compliant with his dressings. 10/20/2015 -- He was seen by [name] recently for a vascular opinion on 10/15/2015. His left lower extremity venous insufficiency duplex  study revealed GSV reflux,SS vein reflux and deep venous reflux in the common femoral vein. His ABIs were non compressible and his TBI on the right was 1.01 and on the left was 0.80. He was asked to continue with the wound care with compressive therapy followed by EVLA of the left GS vein 3 months. He recommended 20-30 mm thigh- high compression stockings and the need for a three-month trial of this. The patient had an Unna boot applied at the vascular office but he could not tolerate this with a lot of pain and issues with his toes and hence came here on Friday for removal of this and we reapplied a 2 layer compression. 11/10/2015 -- patient still has not purchased his 20-30 mm thigh-high compression stockings as prescribed by [name]. Readmission: 08/08/18 on evaluation today patient presents for readmission concerning a new injury to the left anterior lower extremity. He was previously seen in 2017 here in our clinic. He states that he has done fairly well since that point. Nonetheless he is having at this time some pain but states that he hit this on a table that fell over and actually struck his leg. This appears to have pulled back some of his skin which folded in on itself and is causing some difficulty as far as that is concerned. There does not appear to be any evidence of infection at this time. No fevers, chills, nausea, or vomiting noted at this time. He's been using dressings on his own currently without complication. 08/15/18 on evaluation today patient actually appears to be doing somewhat better in regard to his wound of the lower [name] when compared to the first visit last week. I had to do a much more extensive debridement at that time it does appear that I'm gonna have to perform some debridement  today but it does not look to be as extensive by any means. Nonetheless fortunately he does not show any signs of infection he does have discomfort at this site. I believe based on what I'm seeing currently he may benefit from Iodoflex to help keep the wound bed clean. Patient tolerated therapy without complication. Upon evaluation today the patient actually appears to be doing excellent in regard to his left lower extremity ulcer. This is much better than the previous two visits where he had a lot of necrotic tissue around the edge of the wound simply due to the fact that again there was a significant skin tear where the edge had been cleared away prior to reattaching and being able to heal appropriately. He seems to be doing much better at this point. 08/28/18 on evaluation today the patient's wound actually does appear to be showing signs of improvement. With that being said though he is improving he would likely note even greater improvement if we were able to sharply debride the wound. Nonetheless this caused him to much discomfort he tells me. 09/04/18 on evaluation today patient actually appears to be showing signs of improvement in regard to his left lower extremity ulcer. He has been tolerating the dressing changes including the wrap although he tells me at this point that the burning does last for a couple of days even with just the Iodoflex. I was afraid that this may been part of the issue that he was having with discomfort. It does seem to be the case. Nonetheless he shows no signs of evidence of infection at this time which is good news. No fevers chills noted ADMISSION to [facility]  wound care clinic 10/05/2018 This is a patient who was cared for in 2017 and again in the fall of this year at our sister clinic and [facility] [place]. He actually lives in [place] in [place]. We have been dealing with an apparent traumatic area on the left anterior tibial area. This is been present for the last  several months. He was supposed to be using Iodoflex Kerlix and Coban however he was hospitalized from 09/05/2018 through 09/11/2018 with delirium secondary to pneumonia. Since then he is only been putting Vaseline gauze on this without compression. He also has a more recent skin tear on the dorsal right hand that may have only happened in the last week. The patient had arterial studies done in 2017 in January which was 3 years ago. At that point he had noncompressible ABIs but really quite good TBI's both normal. Triphasic waveforms on the right monophasic at the left posterior tibial but triphasic at the left dorsalis pedis. His ABIs in our clinic today were both noncompressible 1/23; the patient has wounds on the right dorsal hand just distal to the wrist and on the left anterior lower extremity. Both of these look very healthy he is using Hydrofera Blue 1/30; left anterior lower extremity wound much smaller. Healthy looking surface. The laceration area just distal to the wrist on the dorsal hand on the right is also just about closed I used Hydrofera Blue here 2/6; left anterior lower extremity wound is much smaller but still open. The laceration area just distal to the wrist on the dorsal hand is fully epithelialized. 2/13; the patient's anterior lower extremity wound is closed. The laceration just distal to the wrist on the dorsal hand is also fully epithelialized and closed. The patient has external compression stockings which I think are 20/30 READMISSION 08/06/2019 Mr. Manheim is a 74 year old man with had several times previously in our clinic. He is a diabetic with a history of chronic renal insufficiency status post kidney transplant in 1983 and again in 2005. He was then in 2017 with a laceration on the left lower extremity. He was worked up at the time with arterial studies and reflux studies. The arterial studies showed ABIs to be noncompressible but TBI's were within normal limits.  I do not have the reflux studies at the moment. He was also sent here in 2019 with a left lower extremity wound and then again in 2020 with left lower extremity trauma a skin tear on the wrist. He was discharged with 20/30 stockings identified from myself that that might not be enough compression. Nevertheless he states he was wearing these fairly reliably. In September he had a fall with a substantial bruise in the area of the wound. He says he saw orthopedics and they told him there was some muscle strain sometime it later this opened into a wound. He has a fairly substantial wound on the right posterior calf. Satellite areas around this including medially and posteriorly. He has not worn his stockings since the injury Past medical history; includes chronic renal failure secondary to diabetes with kidney transplant x2, atrial fibrillation, heart failure with preserved ejection fraction, coronary artery disease. ABIs on the right in our clinic were once again noncompressible 08/13/2019 on evaluation today patient appears to be doing decently well with regard to his wound compared to last week's evaluation. Unfortunately he is still having a lot of discomfort at this point which is I think in some part due to the 3 layer compression wrap which is  a little bit stronger I think for him. When he was here before we actually utilized a Kerlix and Coban wrap which he states seemed to got a little bit better. Nonetheless I think we can probably drop back to this in light of the discomfort that he had. Nonetheless the pain was not really right around the wound itself as much as it was around the ankle in particular. The Iodoflex does seem to have done well for him As the wound is appearing somewhat better today which is excellent news. 11/30; still complaining of a lot of pain. Apparently arterial studies I ordered 2 weeks ago are below. I do not believe we have an appointment with vein and vascular as of  yet; ABI Findings: +---------+------------------+-----+----------+--------+ Right Rt Pressure (mmHg)IndexWaveform Comment  +---------+------------------+-----+----------+--------+ PTA >254 1.50 monophasic  +---------+------------------+-----+----------+--------+ DP >254 1.50 monophasic  +---------+------------------+-----+----------+--------+ Great T [id number] 0.40 Abnormal   +---------+------------------+-----+----------+--------+ +---------+------------------+-----+----------+-------+ Left Lt Pressure (mmHg)IndexWaveform Comment +---------+------------------+-----+----------+-------+ Brachial 169     +---------+------------------+-----+----------+-------+ PTA >254 1.50 monophasic  +---------+------------------+-----+----------+-------+ DP >254 1.50 monophasic  +---------+------------------+-----+----------+-------+ Great T oe40 0.24 Abnormal   +---------+------------------+-----+----------+-------+ Pedal arteries appear hyperemic. Patient refused Brachial pressure in the right arm. Summary: Right: Resting right ankle-brachial index indicates noncompressible right lower extremity arteries. The right toe-brachial index is abnormal. Left: Resting left ankle-brachial index indicates noncompressible left lower extremity arteries. The left toe-brachial index is abnormal. He is also having considerably more swelling in his left calf. This was not there when I last saw him 2 weeks ago. He tells me that some of the home health compression wraps have been slipping down and that may be the issue here however a month I am uncertain 61/6; sees vascular on December 22. Still complaining of a lot of pain. DVT study I did last time was negative for DVT 12/14; still complaining of pain which if this is arterial is certainly claudication and rest keeps him uncomfortable at night. He has an appointment with vein and vascular on December 22. Wound surface  is better using Iodoflex. Once the surface of this is satisfactory and we have exhausted the vascular route. Perhaps an advanced treatment option. He has a configuration of the venous ulceration although his arterial studies are not very good. The other issue is the patient has a transplanted kidney. This will make angiography difficult and challenging issue 12/21; still complaining of pain and drainage. We are using Iodoflex on the wound under compression. He sees Dr. Donnetta Hutching tomorrow to evaluate his noninvasive studies noted above. He has a transplanted kidney further complicating the options for angiography. 12/28; still using Iodoflex under compression. I have Dr. Luther Parody note from 12/22. he noted arterial studies revealing monophasic waveforms at the pedal vessels bilaterally and calcified vessels making the ABI unreliable. He did not comment on the reduced TBI's. He felt these were venous wounds based on the palpable dorsalis pedis pulse. 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 And they arranged for formal venous duplex with reflux studies in the next several weeks. Follow-up with either Scot Dock or Dr. Oneida Alar 09/24/2019 still using Iodoflex under compression. He has an appointment with Dr. Doren Custard on 1/7 with regards to his venous disease. The patient was not felt to have a primary arterial problem for the nonhealing of his wound. We did attempt to wrap him with 3 layer when he first came into the clinic he complained of a lot of pain in the ankle although this may have been because the dressing fell down somewhat. He  has far too edema fluid in the right leg for a good prognosis about healing this wound He comes in today with an excoriation on the bottom part of his right fifth toe. He thinks he may have done this taking off his clothes and that something got caught on the toe. There is no open wound however the toe itself is very painful 1/11; we are using Iodoflex under compression. The  wound bed is clean. He went on to see Dr. Doren Custard on 09/27/2019 he again feels that the patient's arterial supply is adequate. He feels that he might benefit from right greater saphenous vein ablation for a venous ulcer. In the meantime the area that he was complaining about last week on the right fifth toe. An x-ray that I ordered showed marked soft tissue swelling along the distal aspect of the right foot but there was no evidence of osteomyelitis. He comes in today with the fifth toenail just about coming off. He has black eschar underneath the toe on the plantar aspect. The toe was swollen red and very painful. In the right setting this could be a significant soft tissue infection versus ischemia of the toe itself. He did not show this to Dr. Doren Custard 1/18; I am using Iodoflex under compression a large wound on the right posterior calf. He had his right greater saphenous vein ablation by Dr. Doren Custard although I am not sure that is the only problem here. The right fifth toe which was possibly trauma 2 weeks ago continues to be exceptionally painful with a necrotic tip. Maybe not quite as swollen. I started him empirically on Augmentin 5/125 1 p.o. twice daily last week after discussing this with Dr. Loletha Grayer of nephrology. Perhaps somewhat better this week but not as good as I was hoping. A plain x-ray was negative. He comes in today with an area on the medial right calf that was blistered and now open. In looking at things he appears to be systemically fluid volume overloaded. He has a transplanted kidney. He states he takes his Lasix variably when he has appointments he tries not to take it the later I am really not certain if he takes this reliably. However he has far too much edema fluid in the right leg to easily heal this wound and he appears to be developing blisters medially to form additional wounds 1/25; his CO2 angiogram was done by Dr. Doren Custard last week. This showed the proximal arteries all to be patent.  On the right side the common femoral deep femoral superficial femoral popliteal anterior tibial and peroneal arteries were patent. The posterior tibial was occluded but reconstituted distally via collaterals from the peroneal artery he was felt he should have enough blood flow to heal his wounds including the right fifth toe. The right fifth toe looks better however he is still complaining of a lot of pain. The large area which is a venous ulceration posteriorly has a better looking surface I think we can switch to Hydrofera Blue today 2/1; the patient's original wounds on the right posterior medial calf has come down in width however superior to this he has new denuded areas and I am concerned we simply do not have enough edema control. He has already undergone a right greater saphenous ablation. He had a CO2 angiogram done by Dr. Doren Custard and the comment was that we should have enough blood flow to heal the venous wound however we simply do not seem to have enough edema control with 3  layer compression. The patient is status post kidney transplant although his exact renal function is not really clear. Nor am i sure what dose of diuretic he is supposed to be on. He also has the area on the right fifth toe which was unclear etiology but I think became secondarily infected I gave him 2 weeks of antibiotics for this and this seems to have settled down he still has a black eschar over the tip of the toe. X-ray was negative for fracture. He says he has a history of gout. 2/8; the patient's wound on the right posterior calf is about the same. The superficial area medially also about the same. He got a prescription for prednisoneo Gout after he developed erythema on the dorsal aspect of his left great toe going along with the right fifth toe which has been problematic all along. He has not taken it because he is concerned about increasing CBGs. He has a transplanted kidney is already on prednisone 5 mg. He  would not be a good candidate for NSAIDs. Perhaps colchicine. He is not aware of what his uric acid level is 2/15; his right posterior calf wound seems to be coming in in terms of width. Everything here looks fairly good. No mechanical debridement we have been using Hydrofera Blue. He has had an ablation by vein and vascular. Felt to have adequate arterial supply to this area. The patient got prednisone last week from Dr. Angelique Holm of nephrology. At my urging he actually took it. The area on his left toe was a lot better. The right toe was not as painful but still erythematous with a wound at the tip. We have been using silver alginate here 2/22; right posterior calf seems to be gradually epithelializing. Still a fairly substantial wound. He still has an area on the tip of his right fifth toe which I think was gout related. This is gradually improving. We have been using Unna boots to wrap X-ray I did of the foot last time was again negative there was soft tissue irregularity about the distal fifth digit but no radiographic evidence of bone damage 3/1; right posterior calf seems to be gradually improving however there were areas of hyper granulation. We have been using Hydrofera Blue. The hyper granulation is mostly evident in the most superior finger shape projection of the wound itself. The area on the right fifth toe also was slough covered and required debridement. 3/8 continued improvement in the right posterior calf and the tip of the right fifth toe. We have been using Hydrofera Blue under compression he is changing the area to the toe 3/22; continued improvement in the right posterior calf. Unfortunately comes in today with a new skin tear in the mid anterior tibia area this probably had something to do with changing his dressing home health I called and left this a message last week. 3/29; continued improvement in the a substantial wound on the right posterior calf. The skin tear anteriorly  from last week is already healed on the tip of the right fifth toe there is still a nonviable area. 4/5; we have continued contraction of the substantial wound on the right posterior calf. We continue to have problems with the tip of the right fifth toe. We have been using Hydrofera Blue to both wound areas He is complaining about some discomfort in the Achilles area. T me he had surgery for what sounds like a torn Achilles about 10 years ago. ells 4/12; we have continued contraction  of the substantial wound on the right posterior calf. Still an area on the tip of the right fifth toe. I changed him to silver collagen on the toe last week but I think home health continue to use Hydrofera Blue to both wound areas. 4/19; we continue to have contraction of the substantial wound on the right posterior calf however there continues to be hyper granulation. The area on the tip of the fifth toe is very small still some debris on the surface we have been using silver collagen 4/26; both wounds have contracted. I was hoping the fifth toe wound would close over but with probing there is still an open small hole here. 5/3; his wound on the right posterior calf which was his major area continues to contract looks healthy we have been using Hydrofera Blue. Using silver collagen to the fifth toe, not making a lot of progress here 5/10; right posterior calf wound continues to be smaller in terms of surface area and look healthy we have been using Hydrofera Blue here. The tip of the right fifth toe is still open requiring debridement. He has a new laceration on the right forearm he says he hit this on a microwave 6 days ago 02/11/20-The right fifth toe wound is closed, the right posterior calf wound looks healthy with smaller dimensions compared with last time, the right forearm area of laceration has a small blister which I try to open up with scalpel with very little fluid 6/7 the right fifth toe looks fine. Area  on the right forearm is also closed. Right posterior calf wound continues to contract. Much smaller area remaining with healthy surface. We are using Hydrofera Blue under compression 6/21; area on the right posterior calf. There is only 1 small area in the most distal part of this. We have been using Hydrofera Blue under compression this is contracting surface looks healthy 6/28; right posterior calf this is healed. This was initially a hematoma. During the stay in this clinic he had a right greater saphenous vein ablation by Dr. Doren Custard of vein and vascular. He also had a difficult time with his right fifth toeo Infection versus gout. Very difficult time with this so that this has been closed for about 6 weeks. ------------------- READMISSION 04/29/20-Patient returns to the clinic this time with a right index finger swelling and pain been drainage, patient has been to the hand surgeon and vascular surgeon and ended up having a small IandD attempted in the office with expression of some gritty whitish material, patient also hit his hand and developed a small hematoma around this area. He also had vascular Dopplers done that show monophasic right radial right ulnar waveforms. He has follow-up appointments with vascular surgery as well as the hand surgeon. His appointment today with a hand surgeon he was told that he may lose the tip of his index finger if surgery is done to open up the entire area. He is reluctant to have any surgery and would like to try conservative measures He has tried epsom salt soaks with improvement then worsening , he has just finished a course of antibiotics, he does not remember the name 8/16; patient was readmitted to the clinic last week and I am seeing this for the first time. Previously we had looked at this man for a large wound in the right posterior calf that was initially a hematoma. He went on to have a greater saphenous vein ablation. During the stay here he also had  a difficult time with an area over his right fifth toe question infectious question gout at that time. This eventually closed over although I do not think the differential was ever really totally clear in my mind. The patient is here this time with a necrotic area on the palmar surface of his right index finger. He states this started in July with a small red area. He spent some time going to see Dr. Jarome Matin of dermatology. Ended up with Dr. 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 He is also seen hand surgery which is fortunate is he is likely to require either debridement or amputation of this finger. He had a course of antibiotics Keflex and doxycycline I believe but he is finished these. Currently using Betadine. He describes this is very painful. I have not had a chance to look back over notes in care everywhere. I would like to see as hand surgery notes, vascular surgery notes and dermatology notes if this is possible. He also apparently had an x-ray ordered by Dr. Amedeo Plenty that I would like to review. Electronic Signature(s) Signed: 05/05/2020 5:53:04 PM By: [name] MD Entered By: [name] on 05/05/2020 09:34:36 -------------------------------------------------------------------------------- Physical Exam Details Patient Name: Date of Service: [name], [name] HA M 05/05/2020 8:45 A M Medical Record Number: [id number] Patient Account Number: [id number] Date of Birth/Sex: Treating RN: 1946/08/24 (74 y.o. [name] Primary Care Provider: [name] Other Clinician: Referring Provider: Treating Provider/Extender: [name] in Treatment: 0 Cardiovascular Brachial pulses palpable. Thready radial pulse. Interestingly his thumb and medial 3 fingers are warm but the index finger is cool there is no palpable tenderness of any of the metacarpal joints there is no tenderness of any of  the metacarpal phalangeal joints 8/. Musculoskeletal there is no tenderness of any joints in the right hand or wrist. no tophi. Notes 8/16; palmar aspect of the right index finger from the DIP to the tip. Exceptionally painful necrotic surface. I attempted to do a digital block with lidocaine which helps somewhat but still very painful debridement of a small surface area of this finger. Following this I did a deep culture of necrotic tissue. There is no spreading erythema down the finger Electronic Signature(s) Signed: 05/05/2020 5:53:04 PM By: [name] MD Entered By: [name] on 05/05/2020 09:37:47 -------------------------------------------------------------------------------- Physician Orders Details Patient Name: Date of Service: [name], [name] HA M 05/05/2020 8:45 A M Medical Record Number: [id number] Patient Account Number: [id number] Date of Birth/Sex: Treating RN: 17-Feb-1946 (74 y.o. 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 Dressing Change Frequency Wound #11 Hand - 2nd Digit Change dressing every day. Wound Cleansing Wound #11 Hand - 2nd Digit May shower and wash wound with soap and water. 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 - Non-pressure chronic ulcer of skin Bacteria identified in Unspecified specimen by A of other sites limited to breakdown of skin) [name]: [id number] Convenience Name: Anerobic  culture Electronic Signature(s) Signed: 05/05/2020 5:14:32 PM By: [name] RN, BSN Signed: 05/05/2020 5:53:04 PM By: [name] MD Entered By: [name] on 05/05/2020 09:24:16 -------------------------------------------------------------------------------- Problem List Details Patient Name: Date of Service: [name], [name] HA M 05/05/2020 8:45 A M Medical Record Number: [id number] Patient Account Number: [id number] Date of Birth/Sex: Treating RN: 11-Aug-1946 (73 y.o. 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 [name] Primary Care Provider: [name] Other Clinician: Referring Provider: Treating Provider/Extender: [name] in Treatment: 0 Subjective History of Present Illness (HPI) [place] HPI Description: 74 year old gentleman who was recently seen by his nephrologist [name], and noted to have a wound on his left lower extremity which was lacerated 2 months ago and now has reopened. The patient's left shin has a  ulceration with some exudate but no evidence of infection and he was referred to [place] for further care as it was known that the patient has had some peripheral vascular disease in the past. Past medical history significant for chronic kidney disease, atrial fibrillation, diabetes mellitus,status post kidney transplant in 1983 and 2005, a week fistula graft placement, status post previous bowel surgery. 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 10/06/2015 -- x-ray of the left tibia and fibula shows no evidence of osteomyelitis. The patient has also had Doppler studies of his extremity and is awaiting the appointment with the vascular surgeon. We have not yet received these reports. 10/13/2015 -- lower extremity venous duplex reflux evaluation shows reflux in the left common femoral vein, left saphenofemoral junction and the proximal greater saphenous vein extending to the proximal calf. There is also reflux in the left proximal to mid small saphenous vein. Arterial duplex studies done showed the resting ABI was not applicable due to tibial artery medial calcification. 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 The right TBI was 1 which is normal and the left ABI was 1 which is normal. Patient has otherwise been doing fine and has been compliant with his dressings. 10/20/2015 -- He was seen by [name] recently for a vascular opinion on 10/15/2015. His left lower extremity venous insufficiency duplex study revealed GSV reflux,SS vein reflux and deep venous reflux in the common femoral vein. His ABIs were non compressible and his TBI on the right was 1.01 and on the left was 0.80. He was asked to continue with the wound care with compressive therapy followed by EVLA of the left GS vein 3 months. He recommended 20-30 mm thigh- high compression  stockings and the need for a three-month trial of this. The patient had an Unna boot applied at the vascular office but he could not tolerate  this with a lot of pain and issues with his toes and hence came here on Friday for removal of this and we reapplied a 2 layer compression. 11/10/2015 -- patient still has not purchased his 20-30 mm thigh-high compression stockings as prescribed by [name]. Readmission: 08/08/18 on evaluation today patient presents for readmission concerning a new injury to the left anterior lower extremity. He was previously seen in 2017 here in our clinic. He states that he has done fairly well since that point. Nonetheless he is having at this time some pain but states that he hit this on a table that fell over and actually struck his leg. This appears to have pulled back some of his skin which folded in on itself and is causing some difficulty as far as that is concerned. There does not appear to be any evidence of infection at this time. No fevers, chills, nausea, or vomiting noted at this time. He's been using dressings on his own currently without complication. 08/15/18 on evaluation today patient actually appears to be doing somewhat better in regard to his wound of the lower [name] when compared to the first visit last week. I had to do a much more extensive debridement at that time it does appear that I'm gonna have to perform some debridement today but it does not look to be as extensive by any means. Nonetheless fortunately he does not show any signs of infection he does have discomfort at this site. I believe based on what I'm seeing currently he may benefit from Iodoflex to help keep the wound bed clean. Patient tolerated therapy without complication. Upon evaluation today the patient actually appears to be doing excellent in regard to his left lower extremity ulcer. This is much better than the previous two visits where he had a lot of necrotic tissue around the  edge of the wound simply due to the fact that again there was a significant skin tear where the edge had been cleared away prior to reattaching and being able to heal appropriately. He seems to be doing much better at this point. 08/28/18 on evaluation today the patient's wound actually does appear to be showing signs of improvement. With that being said though he is improving he would likely note even greater improvement if we were able to sharply debride the wound. Nonetheless this caused him to much discomfort he tells me. 09/04/18 on evaluation today patient actually appears to be showing signs of improvement in regard to his left lower extremity ulcer. He has been tolerating the dressing changes including the wrap although he tells me at this point that the burning does last for a couple of days even with just the Iodoflex. I was afraid that this may been part of the issue that he was having with discomfort. It does seem to be the case. Nonetheless he shows no signs of evidence of infection at this time which is good news. No fevers chills noted ADMISSION to [name] 10/05/2018 This is a patient who was cared for in 2017 and again in the fall of this year at our sister clinic and [place]. He actually lives in [place] in [place]. We have been dealing with an apparent traumatic area on the left anterior tibial area. This is been present for the last several months. He was supposed to be using Iodoflex Kerlix and Coban however he was hospitalized from 09/05/2018 through 09/11/2018 with delirium secondary to pneumonia. Since then he is only been putting  Vaseline gauze on this without compression. He also has a more recent skin tear on the dorsal right hand that may have only happened in the last week. The patient had arterial studies done in 2017 in January which was 3 years ago. At that point he had noncompressible ABIs but really quite good TBI's both normal. Triphasic  waveforms on the right monophasic at the left posterior tibial but triphasic at the left dorsalis pedis. His ABIs in our clinic today were both noncompressible 1/23; the patient has wounds on the right dorsal hand just distal to the wrist and on the left anterior lower extremity. Both of these look very healthy he is using Hydrofera Blue 1/30; left anterior lower extremity wound much smaller. Healthy looking surface. The laceration area just distal to the wrist on the dorsal hand on the right is also just about closed I used Hydrofera Blue here 2/6; left anterior lower extremity wound is much smaller but still open. The laceration area just distal to the wrist on the dorsal hand is fully epithelialized. 2/13; the patient's anterior lower extremity wound is closed. The laceration just distal to the wrist on the dorsal hand is also fully epithelialized and closed. The patient has external compression stockings which I think are 20/30 READMISSION 08/06/2019 Mr. Timbrook is a 74 year old man with had several times previously in our clinic. He is a diabetic with a history of chronic renal insufficiency status post kidney transplant in 1983 and again in 2005. He was then in 2017 with a laceration on the left lower extremity. He was worked up at the time with arterial studies and reflux studies. The arterial studies showed ABIs to be noncompressible but TBI's were within normal limits. I do not have the reflux studies at the moment. He was also sent here in 2019 with a left lower extremity wound and then again in 2020 with left lower extremity trauma a skin tear on the wrist. He was discharged with 20/30 stockings identified from myself that that might not be enough compression. Nevertheless he states he was wearing these fairly reliably. In September he had a fall with a substantial bruise in the area of the wound. He says he saw orthopedics and they told him there was some muscle strain sometime it later  this opened into a wound. He has a fairly substantial wound on the right posterior calf. Satellite areas around this including medially and posteriorly. He has not worn his stockings since the injury Past medical history; includes chronic renal failure secondary to diabetes with kidney transplant x2, atrial fibrillation, heart failure with preserved ejection fraction, coronary artery disease. ABIs on the right in our clinic were once again noncompressible 08/13/2019 on evaluation today patient appears to be doing decently well with regard to his wound compared to last week's evaluation. Unfortunately he is still having a lot of discomfort at this point which is I think in some part due to the 3 layer compression wrap which is a little bit stronger I think for him. When he was here before we actually utilized a Kerlix and Coban wrap which he states seemed to got a little bit better. Nonetheless I think we can probably drop back to this in light of the discomfort that he had. Nonetheless the pain was not really right around the wound itself as much as it was around the ankle in particular. The Iodoflex does seem to have done well for him As the wound is appearing somewhat better today which  is excellent news. 11/30; still complaining of a lot of pain. Apparently arterial studies I ordered 2 weeks ago are below. I do not believe we have an appointment with vein and vascular as of yet; ABI Findings: +---------+------------------+-----+----------+--------+ Right Rt Pressure (mmHg)IndexWaveform Comment  +---------+------------------+-----+----------+--------+ PTA >254 1.50 monophasic  +---------+------------------+-----+----------+--------+ DP >254 1.50 monophasic  +---------+------------------+-----+----------+--------+ Great T oe68 0.40 Abnormal   +---------+------------------+-----+----------+--------+ +---------+------------------+-----+----------+-------+ Left Lt  Pressure (mmHg)IndexWaveform Comment +---------+------------------+-----+----------+-------+ Brachial 169     +---------+------------------+-----+----------+-------+ PTA >254 1.50 monophasic  +---------+------------------+-----+----------+-------+ DP >254 1.50 monophasic  +---------+------------------+-----+----------+-------+ Great T oe40 0.24 Abnormal   +---------+------------------+-----+----------+-------+ Pedal arteries appear hyperemic. Patient refused Brachial pressure in the right arm. Summary: Right: Resting right ankle-brachial index indicates noncompressible right lower extremity arteries. The right toe-brachial index is abnormal. Left: Resting left ankle-brachial index indicates noncompressible left lower extremity arteries. The left toe-brachial index is abnormal. He is also having considerably more swelling in his left calf. This was not there when I last saw him 2 weeks ago. He tells me that some of the home health compression wraps have been slipping down and that may be the issue here however a month I am uncertain 93/7; sees vascular on December 22. Still complaining of a lot of pain. DVT study I did last time was negative for DVT 12/14; still complaining of pain which if this is arterial is certainly claudication and rest keeps him uncomfortable at night. He has an appointment with vein and vascular on December 22. Wound surface is better using Iodoflex. Once the surface of this is satisfactory and we have exhausted the vascular route. Perhaps an advanced treatment option. He has a configuration of the venous ulceration although his arterial studies are not very good. The other issue is the patient has a transplanted kidney. This will make angiography difficult and challenging issue 12/21; still complaining of pain and drainage. We are using Iodoflex on the wound under compression. He sees Dr. Donnetta Hutching tomorrow to evaluate his noninvasive studies  noted above. He has a transplanted kidney further complicating the options for angiography. 12/28; still using Iodoflex under compression. I have Dr. Luther Parody note from 12/22. he noted arterial studies revealing monophasic waveforms at the pedal vessels bilaterally and calcified vessels making the ABI unreliable. He did not comment on the reduced TBI's. He felt these were venous wounds based on the palpable dorsalis pedis pulse. 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 And they arranged for formal venous duplex with reflux studies in the next several weeks. Follow-up with either Scot Dock or Dr. Oneida Alar 09/24/2019 still using Iodoflex under compression. He has an appointment with Dr. Doren Custard on 1/7 with regards to his venous disease. The patient was not felt to have a primary arterial problem for the nonhealing of his wound. We did attempt to wrap him with 3 layer when he first came into the clinic he complained of a lot of pain in the ankle although this may have been because the dressing fell down somewhat. He has far too edema fluid in the right leg for a good prognosis about healing this wound He comes in today with an excoriation on the bottom part of his right fifth toe. He thinks he may have done this taking off his clothes and that something got caught on the toe. There is no open wound however the toe itself is very painful 1/11; we are using Iodoflex under compression. The wound bed is clean. He went on to see Dr. Doren Custard on 09/27/2019 he again feels that  the patient's arterial supply is adequate. He feels that he might benefit from right greater saphenous vein ablation for a venous ulcer. In the meantime the area that he was complaining about last week on the right fifth toe. An x-ray that I ordered showed marked soft tissue swelling along the distal aspect of the right foot but there was no evidence of osteomyelitis. He comes in today with the fifth toenail just about coming off. He has black  eschar underneath the toe on the plantar aspect. The toe was swollen red and very painful. In the right setting this could be a significant soft tissue infection versus ischemia of the toe itself. He did not show this to Dr. Doren Custard 1/18; I am using Iodoflex under compression a large wound on the right posterior calf. He had his right greater saphenous vein ablation by Dr. Doren Custard although I am not sure that is the only problem here. ooThe right fifth toe which was possibly trauma 2 weeks ago continues to be exceptionally painful with a necrotic tip. Maybe not quite as swollen. I started him empirically on Augmentin 5/125 1 p.o. twice daily last week after discussing this with Dr. Loletha Grayer of nephrology. Perhaps somewhat better this week but not as good as I was hoping. A plain x-ray was negative. He comes in today with an area on the medial right calf that was blistered and now open. In looking at things he appears to be systemically fluid volume overloaded. He has a transplanted kidney. He states he takes his Lasix variably when he has appointments he tries not to take it the later I am really not certain if he takes this reliably. However he has far too much edema fluid in the right leg to easily heal this wound and he appears to be developing blisters medially to form additional wounds 1/25; his CO2 angiogram was done by Dr. Doren Custard last week. This showed the proximal arteries all to be patent. On the right side the common femoral deep femoral superficial femoral popliteal anterior tibial and peroneal arteries were patent. The posterior tibial was occluded but reconstituted distally via collaterals from the peroneal artery he was felt he should have enough blood flow to heal his wounds including the right fifth toe. The right fifth toe looks better however he is still complaining of a lot of pain. The large area which is a venous ulceration posteriorly has a better looking surface I think we can switch to  Hydrofera Blue today 2/1; the patient's original wounds on the right posterior medial calf has come down in width however superior to this he has new denuded areas and I am concerned we simply do not have enough edema control. He has already undergone a right greater saphenous ablation. He had a CO2 angiogram done by Dr. Doren Custard and the comment was that we should have enough blood flow to heal the venous wound however we simply do not seem to have enough edema control with 3 layer compression. The patient is status post kidney transplant although his exact renal function is not really clear. Nor am i sure what dose of diuretic he is supposed to be on. He also has the area on the right fifth toe which was unclear etiology but I think became secondarily infected I gave him 2 weeks of antibiotics for this and this seems to have settled down he still has a black eschar over the tip of the toe. X-ray was negative for fracture. He says he has  a history of gout. 2/8; the patient's wound on the right posterior calf is about the same. The superficial area medially also about the same. He got a prescription for prednisoneo Gout after he developed erythema on the dorsal aspect of his left great toe going along with the right fifth toe which has been problematic all along. He has not taken it because he is concerned about increasing CBGs. He has a transplanted kidney is already on prednisone 5 mg. He would not be a good candidate for NSAIDs. Perhaps colchicine. He is not aware of what his uric acid level is 2/15; his right posterior calf wound seems to be coming in in terms of width. Everything here looks fairly good. No mechanical debridement we have been using Hydrofera Blue. He has had an ablation by vein and vascular. Felt to have adequate arterial supply to this area. The patient got prednisone last week from Dr. Angelique Holm of nephrology. At my urging he actually took it. The area on his left toe was a lot  better. The right toe was not as painful but still erythematous with a wound at the tip. We have been using silver alginate here 2/22; right posterior calf seems to be gradually epithelializing. Still a fairly substantial wound. He still has an area on the tip of his right fifth toe which I think was gout related. This is gradually improving. We have been using Unna boots to wrap X-ray I did of the foot last time was again negative there was soft tissue irregularity about the distal fifth digit but no radiographic evidence of bone damage 3/1; right posterior calf seems to be gradually improving however there were areas of hyper granulation. We have been using Hydrofera Blue. The hyper granulation is mostly evident in the most superior finger shape projection of the wound itself. ooThe area on the right fifth toe also was slough covered and required debridement. 3/8 continued improvement in the right posterior calf and the tip of the right fifth toe. We have been using Hydrofera Blue under compression he is changing the area to the toe 3/22; continued improvement in the right posterior calf. Unfortunately comes in today with a new skin tear in the mid anterior tibia area this probably had something to do with changing his dressing home health I called and left this a message last week. 3/29; continued improvement in the a substantial wound on the right posterior calf. The skin tear anteriorly from last week is already healed on the tip of the right fifth toe there is still a nonviable area. 4/5; we have continued contraction of the substantial wound on the right posterior calf. We continue to have problems with the tip of the right fifth toe. We have been using Hydrofera Blue to both wound areas He is complaining about some discomfort in the Achilles area. T me he had surgery for what sounds like a torn Achilles about 10 years ago. ells 4/12; we have continued contraction of the substantial wound  on the right posterior calf. ooStill an area on the tip of the right fifth toe. I changed him to silver collagen on the toe last week but I think home health continue to use Hydrofera Blue to both wound areas. 4/19; we continue to have contraction of the substantial wound on the right posterior calf however there continues to be hyper granulation. ooThe area on the tip of the fifth toe is very small still some debris on the surface we have been using  silver collagen 4/26; both wounds have contracted. I was hoping the fifth toe wound would close over but with probing there is still an open small hole here. 5/3; his wound on the right posterior calf which was his major area continues to contract looks healthy we have been using Hydrofera Blue. Using silver collagen to the fifth toe, not making a lot of progress here 5/10; right posterior calf wound continues to be smaller in terms of surface area and look healthy we have been using Hydrofera Blue here. The tip of the right fifth toe is still open requiring debridement. He has a new laceration on the right forearm he says he hit this on a microwave 6 days ago 02/11/20-The right fifth toe wound is closed, the right posterior calf wound looks healthy with smaller dimensions compared with last time, the right forearm area of laceration has a small blister which I try to open up with scalpel with very little fluid 6/7 the right fifth toe looks fine. Area on the right forearm is also closed. Right posterior calf wound continues to contract. Much smaller area remaining with healthy surface. We are using Hydrofera Blue under compression 6/21; area on the right posterior calf. There is only 1 small area in the most distal part of this. We have been using Hydrofera Blue under compression this is contracting surface looks healthy 6/28; right posterior calf this is healed. This was initially a hematoma. During the stay in this clinic he had a right greater  saphenous vein ablation by Dr. Doren Custard of vein and vascular. He also had a difficult time with his right fifth toeo Infection versus gout. Very difficult time with this so that this has been closed for about 6 weeks. ------------------- READMISSION 04/29/20-Patient returns to the clinic this time with a right index finger swelling and pain been drainage, patient has been to the hand surgeon and vascular surgeon and ended up having a small IandD attempted in the office with expression of some gritty whitish material, patient also hit his hand and developed a small hematoma around this area. He also had vascular Dopplers done that show monophasic right radial right ulnar waveforms. He has follow-up appointments with vascular surgery as well as the hand surgeon. His appointment today with a hand surgeon he was told that he may lose the tip of his index finger if surgery is done to open up the entire area. He is reluctant to have any surgery and would like to try conservative measures He has tried epsom salt soaks with improvement then worsening , he has just finished a course of antibiotics, he does not remember the name 8/16; patient was readmitted to the clinic last week and I am seeing this for the first time. Previously we had looked at this man for a large wound in the right posterior calf that was initially a hematoma. He went on to have a greater saphenous vein ablation. During the stay here he also had a difficult time with an area over his right fifth toe question infectious question gout at that time. This eventually closed over although I do not think the differential was ever really totally clear in my mind. The patient is here this time with a necrotic area on the palmar surface of his right index finger. He states this started in July with a small red area. He spent some time going to see Dr. Jarome Matin of dermatology. Ended up with Dr. Doren Custard reviewing thiso Small vessel disease with  a  monophasic right radial ulnar waveforms. He is also seen hand surgery which is fortunate is he is likely to require either debridement or amputation of this finger. He had a course of antibiotics Keflex and doxycycline I believe but he is finished these. Currently using Betadine. He describes this is very painful. I have not had a chance to look back over notes in care everywhere. I would like to see as hand surgery notes, vascular surgery notes and dermatology notes if this is possible. He also apparently had an x-ray ordered by Dr. Amedeo Plenty that I would like to review. Objective Constitutional Vitals Time Taken: 8:55 AM, Height: 68 in, Weight: 185 lbs, BMI: 28.1, Temperature: 99.2 F, Pulse: 59 bpm, Respiratory Rate: 18 breaths/min, Blood Pressure: 145/64 mmHg, Capillary Blood Glucose: 120 mg/dl. Cardiovascular Brachial pulses palpable. Thready radial pulse. Interestingly his thumb and medial 3 fingers are warm but the index finger is cool there is no palpable tenderness of any of the metacarpal joints there is no tenderness of any of the metacarpal phalangeal joints 8/. Musculoskeletal there is no tenderness of any joints in the right hand or wrist. no tophi. General Notes: 8/16; palmar aspect of the right index finger from the DIP to the tip. Exceptionally painful necrotic surface. I attempted to do a digital block with lidocaine which helps somewhat but still very painful debridement of a small surface area of this finger. Following this I did a deep culture of necrotic tissue. There is no spreading erythema down the finger Integumentary (Hair, Skin) Wound #11 status is Open. Original cause of wound was Gradually Appeared. The wound is located on the Hand - 2nd Digit. The wound measures 2.3cm length x 2cm width x 0.2cm depth; 3.613cm^2 area and 0.723cm^3 volume. There is Fat Layer (Subcutaneous Tissue) Exposed exposed. There is no tunneling or undermining noted. There is a small amount of  serosanguineous drainage noted. The wound margin is flat and intact. There is small (1-33%) granulation within the wound bed. There is a large (67-100%) amount of necrotic tissue within the wound bed including Eschar. 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 There was a Excisional Skin/Subcutaneous Tissue Debridement with a total area of 1 sq cm performed by Ricard Dillon., MD. With the following instrument(s): Blade, and Curette to remove Viable and Non-Viable tissue/material. Material removed includes Subcutaneous Tissue and Skin: Epidermis and after achieving pain control using Lidocaine Injectable: 1%. 1 specimen was taken by a Swab and sent to the lab per facility protocol. A time out was conducted at 09:20, prior to the start of the procedure. A Minimum amount of bleeding was controlled with Pressure. The procedure was tolerated well with a pain level of 4 throughout and a pain level of 0 following the procedure. Post Debridement Measurements: 2.3cm length x 2cm width x 0.2cm depth; 0.723cm^3 volume. Character of Wound/Ulcer Post Debridement is stable. Post procedure Diagnosis Wound #11: Same as Pre-Procedure Plan Follow-up Appointments: Return Appointment in 1 week. Dressing Change Frequency: Wound #11 Hand - 2nd Digit: Change dressing every day. Wound Cleansing: Wound #11 Hand - 2nd Digit: May shower and wash wound with soap and water. Primary Wound Dressing: Wound #11 Hand - 2nd Digit: Calcium Alginate with Silver Secondary Dressing: Wound #11 Hand - 2nd Digit: Kerlix/Rolled Gauze  Dry Gauze Laboratory ordered were: Anerobic culture - right 2nd finger 1. I did a digital block and managed to get a culture after a small area of subcutaneous  debridement. 2. I think this is either an infection or tissue ischemia with intense necrosis. At this point there is no evidence of spreading infection. 3. I elected to wait and see what returns from the culture point of view before antibiotics. We will need to make sure there is no interaction with his renal rejection medications. He is on 5 mg of prednisone 4. During the last stay in our clinic he developed a very painful area on the right fifth toe. There was some suggestion or thought that this easily could be gout. I think he received a course of prednisone from his primary doctor and this loosely correlated with the improvement however he was also on antibiotics. 5. He had a uric acid level ordered by Dr. Evette Doffing done at [facility] we still do not have this result 6. I would like to review the records that he is already had from 3 different doctors, dermatology, vein and vascular and hand surgery. He may need another x- ray. 7. I agree that the patient very well could lose the finger here. I think there is some issue with blood supply as the fingertip is cool versus his other fingers. He has an appointment with hand surgery on 8/24 which is fortunate Electronic Signature(s) Signed: 05/05/2020 5:53:04 PM By: [name] MD Entered By: [name] on 05/05/2020 09:40:44 -------------------------------------------------------------------------------- SuperBill Details Patient Name: Date of Service: [name], [name] HA M 05/05/2020 Medical Record Number: [id number] Patient Account Number: [id number] Date of Birth/Sex: Treating RN: Feb 10, 1946 (74 y.o. 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

## 2020-05-06 NOTE — Progress Notes (Signed)
MUNEEB, VERAS ([id number]) Visit Report for 05/05/2020 Arrival Information Details Patient Name: Date of Service: [name], [name] 05/05/2020 8:45 A M Medical Record Number: [id number] Patient Account Number: [id number] Date of Birth/Sex: Treating RN: 10-24-1945 (75 y.o. [name] Primary Care Adriene Knipfer: [name] Other Clinician: Referring Luise Yamamoto: Treating Ashawnti Tangen/Extender: [name] in Treatment: 0 Visit Information History Since Last Visit Added or deleted any medications: No Patient Arrived: Ambulatory Any new allergies or adverse reactions: No Arrival Time: 08:54 Had a fall or experienced change in No Accompanied By: self activities of daily living that may affect Transfer Assistance: None risk of falls: Patient Identification Verified: Yes Signs or symptoms of abuse/neglect since last visito No Secondary Verification Process Completed: Yes Hospitalized since last visit: No Patient Requires Transmission-Based Precautions: No Implantable device outside of the clinic excluding No Patient Has Alerts: No cellular tissue based products placed in the center since last visit: Has Dressing in Place as Prescribed: Yes Pain Present Now: Yes Electronic Signature(s) Signed: 05/05/2020 9:11:13 AM By: [name] Entered By: [name] on 05/05/2020 08:55:22 -------------------------------------------------------------------------------- Encounter Discharge Information Details Patient Name: Date of Service: [name], [name] HA M 05/05/2020 8:45 A M Medical Record Number: [id number] Patient Account Number: [id number] Date of Birth/Sex: Treating RN: [birth year] (74 y.o. [name] Primary Care Shanyce Daris: [name] Other Clinician: Referring Shemekia Patane: Treating Travus Oren/Extender: [name] in Treatment: 0 Encounter Discharge Information Items Post Procedure  Vitals Discharge Condition: Stable Temperature (F): 99.2 Ambulatory Status: Ambulatory Pulse (bpm): 59 Discharge Destination: Home Respiratory Rate (breaths/min): 18 Transportation: Private Auto Blood Pressure (mmHg): 145/64 Accompanied By: self Schedule Follow-up Appointment: Yes Clinical Summary of Care: Patient Declined Electronic Signature(s) Signed: 05/06/2020 5:25:51 PM By: [name] RN, BSN Entered By: [name] on 05/05/2020 09:39:51 -------------------------------------------------------------------------------- Lower Extremity Assessment Details Patient Name: Date of Service: [name], [name] HA M 05/05/2020 8:45 A M Medical Record Number: [id number] Patient Account Number: [id number] Date of Birth/Sex: Treating RN: 06-21-1946 (74 y.o. [name] Primary Care Squire Withey: [name] Other Clinician: Referring Nello Corro: Treating Eyvonne Burchfield/Extender: [name] in Treatment: 0 Electronic Signature(s) Signed: 05/05/2020 5:24:06 PM By: Kela Millin Entered By: Kela Millin on 05/05/2020 09:03:32 -------------------------------------------------------------------------------- Multi Wound Chart Details Patient Name: Date of Service: [name], [name] HA M 05/05/2020 8:45 A M Medical Record Number: [id number] Patient Account Number: [id number] Date of Birth/Sex: Treating RN: 1946-08-08 (74 y.o. [name] Primary Care Oletha Tolson: [name] Other Clinician: Referring Jaxson Anglin: Treating Finnley Larusso/Extender: [name] in Treatment: 0 Vital Signs Height(in): 68 Capillary Blood Glucose(mg/dl): 120 Weight(lbs): 185 Pulse(bpm): 62 Body Mass Index(BMI): 28 Blood Pressure(mmHg): 145/64 Temperature(F): 99.2 Respiratory Rate(breaths/min): 18 Photos: [11:No Photos Hand - 2nd Digit] [N/A:N/A N/A] Wound Location: [11:Gradually Appeared] [N/A:N/A] Wounding Event:  [11:Calcinosis] [N/A:N/A] Primary Etiology: [11:Cataracts, Anemia, Arrhythmia,] [N/A:N/A] Comorbid History: [11:Congestive Heart Failure, Hypertension, Type II Diabetes, Gout, Neuropathy 04/20/2020] [N/A:N/A] Date Acquired: [11:0] [N/A:N/A] Weeks of Treatment: [11:Open] [N/A:N/A] Wound Status: [11:2.3x2x0.2] [N/A:N/A] Measurements L x W x D (cm) [11:3.613] [N/A:N/A] A (cm) : rea [11:0.723] [N/A:N/A] Volume (cm) : [11:20.10%] [N/A:N/A] % Reduction in Area: [11:20.10%] [N/A:N/A] % Reduction in Volume: [11:Full Thickness Without Exposed] [N/A:N/A] Classification: [11:Support Structures Small] [N/A:N/A] Exudate A mount: [11:Serosanguineous] [N/A:N/A] Exudate Type: [11:red, brown] [N/A:N/A] Exudate Color: [11:Flat and Intact] [N/A:N/A] Wound Margin: [11:Small (1-33%)] [N/A:N/A] Granulation Amount: [11:Large (67-100%)] [N/A:N/A] Necrotic Amount: [11:Eschar] [N/A:N/A] Necrotic Tissue: [11:Fat Layer (Subcutaneous Tissue)] [  N/A:N/A] Exposed Structures: [11:Exposed: Yes Fascia: No Tendon: No Muscle: No Joint: No Bone: No Small (1-33%)] [N/A:N/A] Epithelialization: Treatment Notes Electronic Signature(s) Signed: 05/05/2020 5:14:32 PM By: [name] RN, BSN Signed: 05/05/2020 5:53:04 PM By: [name] MD Entered By: [name] on 05/05/2020 09:30:29 -------------------------------------------------------------------------------- Multi-Disciplinary Care Plan Details Patient Name: Date of Service: [name], [name] HA M 05/05/2020 8:45 A M Medical Record Number: [id number] Patient Account Number: [id number] Date of Birth/Sex: Treating RN: 15-Aug-1946 (74 y.o. [name] Primary Care Karyss Frese: [name] Other Clinician: Referring Nikolina Simerson: Treating Zarie Kosiba/Extender: [name] in Treatment: 0 Active Inactive Wound/Skin Impairment Nursing Diagnoses: Knowledge deficit related to ulceration/compromised skin  integrity Goals: Patient/caregiver will verbalize understanding of skin care regimen Date Initiated: 04/29/2020 Target Resolution Date: 05/30/2020 Goal Status: Active Ulcer/skin breakdown will have a volume reduction of 30% by week 4 Date Initiated: 04/29/2020 Target Resolution Date: 05/30/2020 Goal Status: Active Interventions: Assess patient/caregiver ability to obtain necessary supplies Assess patient/caregiver ability to perform ulcer/skin care regimen upon admission and as needed Assess ulceration(s) every visit Notes: Electronic Signature(s) Signed: 05/05/2020 5:14:32 PM By: [name] RN, BSN Entered By: [name] on 05/05/2020 09:27:16 -------------------------------------------------------------------------------- Pain Assessment Details Patient Name: Date of Service: [name], [name] HA M 05/05/2020 8:45 A M Medical Record Number: [id number] Patient Account Number: [id number] Date of Birth/Sex: Treating RN: 06-Feb-1946 (74 y.o. [name] Primary Care Dalisha Shively: [name] Other Clinician: Referring Kendra Grissett: Treating Burton Gahan/Extender: [name] in Treatment: 0 Active Problems Location of Pain Severity and Description of Pain Patient Has Paino Yes Patient Has Paino Yes Site Locations Rate the pain. Current Pain Level: 4 Pain Management and Medication Current Pain Management: Electronic Signature(s) Signed: 05/05/2020 9:11:13 AM By: [name] Signed: 05/05/2020 5:14:32 PM By: [name] RN, BSN Entered By: [name] on 05/05/2020 08:56:02 -------------------------------------------------------------------------------- Patient/Caregiver Education Details Patient Name: Date of Service: [name] 8/16/2021andnbsp8:45 A M Medical Record Number: [id number] Patient Account Number: [id number] Date of Birth/Gender: Treating RN: 1946-01-22 (74 y.o. [name] Primary Care Physician:  [name] Other Clinician: Referring Physician: Treating Physician/Extender: [name] in Treatment: 0 Education Assessment Education Provided To: Patient Education Topics Provided Wound/Skin Impairment: Methods: Explain/Verbal Responses: State content correctly Electronic Signature(s) Signed: 05/05/2020 5:14:32 PM By: [name] RN, BSN Entered By: [name] on 05/05/2020 09:29:12 -------------------------------------------------------------------------------- Wound Assessment Details Patient Name: Date of Service: [name], [name] HA M 05/05/2020 8:45 A M Medical Record Number: [id number] Patient Account Number: [id number] Date of Birth/Sex: Treating RN: 1946-06-25 (74 y.o. [name] Primary Care Hermena Swint: Other Clinician: [name] Referring Torrence Hammack: Treating Kassady Laboy/Extender: [name] in Treatment: 0 Wound Status Wound Number: 11 Primary Calcinosis Etiology: Wound Location: Hand - 2nd Digit Wound Open Wounding Event: Gradually Appeared Status: Date Acquired: 04/20/2020 Comorbid Cataracts, Anemia, Arrhythmia, Congestive Heart Failure, Weeks Of Treatment: 0 History: Hypertension, Type II Diabetes, Gout, Neuropathy Clustered Wound: No Photos Photo Uploaded By: Mikeal Hawthorne on 05/06/2020 11:55:56 Wound Measurements Length: (cm) 2.3 Width: (cm) 2 Depth: (cm) 0.2 Area: (cm) 3.613 Volume: (cm) 0.723 % Reduction in Area: 20.1% % Reduction in Volume: 20.1% Epithelialization: Small (1-33%) Tunneling: No Undermining: No Wound Description Classification: Full Thickness Without Exposed Support Structu Wound Margin: Flat and Intact Exudate Amount: Small Exudate Type: Serosanguineous Exudate Color: red, brown res Foul Odor After Cleansing: No Slough/Fibrino No Wound Bed Granulation Amount: Small (1-33%) Exposed  Structure Necrotic Amount: Large  (67-100%) Fascia Exposed: No Necrotic Quality: Eschar Fat Layer (Subcutaneous Tissue) Exposed: Yes Tendon Exposed: No Muscle Exposed: No Joint Exposed: No Bone Exposed: No Treatment Notes Wound #11 (Hand - 2nd Digit) 3. Primary Dressing Applied Calcium Alginate Ag 4. Secondary Dressing Dry Gauze Roll Gauze Notes netting Electronic Signature(s) Signed: 05/05/2020 5:14:32 PM By: [name] RN, BSN Signed: 05/05/2020 5:24:06 PM By: Kela Millin Entered By: Kela Millin on 05/05/2020 09:03:48 -------------------------------------------------------------------------------- Vitals Details Patient Name: Date of Service: [name], [name] HA M 05/05/2020 8:45 A M Medical Record Number: [id number] Patient Account Number: [id number] Date of Birth/Sex: Treating RN: 01-06-1946 (74 y.o. [name] Primary Care Termaine Roupp: [name] Other Clinician: Referring Jeraldine Primeau: Treating Panayiotis Rain[place]/Extender: [name] in Treatment: 0 Vital Signs Time Taken: 08:55 Temperature (F): 99.2 Height (in): 68 Pulse (bpm): 59 Weight (lbs): 185 Respiratory Rate (breaths/min): 18 Body Mass Index (BMI): 28.1 Blood Pressure (mmHg): 145/64 Capillary Blood Glucose (mg/dl): 120 Reference Range: 80 - 120 mg / dl Electronic Signature(s) Signed: 05/05/2020 9:11:13 AM By: [name] Entered By: [name] on 05/05/2020 08:55:44

## 2020-05-09 LAB — AEROBIC CULTURE W GRAM STAIN (SUPERFICIAL SPECIMEN): Gram Stain: NONE SEEN

## 2020-05-12 ENCOUNTER — Encounter (HOSPITAL_BASED_OUTPATIENT_CLINIC_OR_DEPARTMENT_OTHER): Payer: Medicare Other | Admitting: Internal Medicine

## 2020-05-12 DIAGNOSIS — L98491 Non-pressure chronic ulcer of skin of other sites limited to breakdown of skin: Secondary | ICD-10-CM | POA: Diagnosis not present

## 2020-05-13 ENCOUNTER — Telehealth: Payer: Self-pay | Admitting: *Deleted

## 2020-05-13 ENCOUNTER — Other Ambulatory Visit: Payer: Self-pay | Admitting: Orthopedic Surgery

## 2020-05-13 NOTE — Telephone Encounter (Signed)
° °  [facility] Pre-operative Risk Assessment    HEARTCARE STAFF: - Please ensure there is not already an duplicate clearance open for this procedure. - Under Visit Info/Reason for Call, type in Other and utilize the format Clearance MM/DD/YY or Clearance TBD. Do not use dashes or single digits. - If request is for dental extraction, please clarify the # of teeth to be extracted.  Request for surgical clearance:  1. What type of surgery is being performed? RIGHT INDEX FINGER DEBRIDEMENT vs AMPUTATION   2. When is this surgery scheduled? 05/22/20   3. What type of clearance is required (medical clearance vs. Pharmacy clearance to hold med vs. Both)? MEDICAL  4. Are there any medications that need to be held prior to surgery and how long? ASA   5. Practice name and name of physician performing surgery? [facility]; DR. Lennette Bihari [facility]   6. What is the office phone number? [phone]   7.   What is the office fax number? [phone]  8.   Anesthesia type (None, local, MAC, general) ? CHOICE   [name] 05/13/2020, 3:46 PM  _________________________________________________________________   (provider comments below)

## 2020-05-13 NOTE — Progress Notes (Signed)
LAVONTAY, KIRK ([id number]) Visit Report for 05/12/2020 HPI Details Patient Name: Date of Service: [name], [name] 05/12/2020 8:45 A M Medical Record Number: [id number] Patient Account Number: [id number] Date of Birth/Sex: Treating RN: 1946/01/23 (74 y.o. [name] Primary Care Provider: [name] Other Clinician: Referring Provider: Treating Provider/Extender: [name] in Treatment: 1 History of Present Illness HPI Description: [name] HPI Description: 74 year old gentleman who was recently seen by his nephrologist [name], and noted to have a wound on his left lower extremity which was lacerated 2 months ago and now has reopened. The patient's left shin has a ulceration with some exudate but no evidence of infection and he was referred to [place] for further care as it was known that the patient has had some peripheral vascular disease in the past. Past medical history significant for chronic kidney disease, atrial fibrillation, diabetes mellitus,status post kidney transplant in 1983 and 2005, a week fistula graft placement, status post previous bowel surgery. 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 10/06/2015 -- x-ray of the left tibia and fibula shows no evidence of osteomyelitis. The patient has also had Doppler studies of his extremity and is awaiting the appointment with the vascular surgeon. We have not yet received these reports. 10/13/2015 -- lower extremity venous duplex reflux evaluation shows reflux in the left common femoral vein, left saphenofemoral junction and the proximal greater saphenous vein extending to the proximal calf. There is also reflux in the left proximal to mid small saphenous vein. Arterial duplex studies done showed the resting ABI was not applicable due to tibial artery medial calcification. 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 The right TBI was 1 which is normal and the left ABI was 1 which is normal. Patient has otherwise been doing fine and has been compliant with his dressings. 10/20/2015 -- He was seen by [name] recently for a vascular opinion on 10/15/2015. His left lower extremity venous insufficiency duplex study revealed GSV reflux,SS vein reflux and deep venous reflux in the common femoral vein. His ABIs were non compressible and his TBI on the right was 1.01 and on the left was 0.80. He was asked to continue with the wound care with compressive therapy followed by EVLA of the left GS vein 3 months. He recommended 20-30 mm thigh- high compression stockings and the need for a three-month trial of this. The patient had an Unna boot applied at the vascular office but he could not tolerate this with a lot of pain and issues with his toes and hence came here on Friday for removal of this and we reapplied a 2 layer compression. 11/10/2015 -- patient still has not purchased his 20-30 mm thigh-high compression stockings as prescribed by [name]. Readmission: 08/08/18 on evaluation today patient presents for readmission concerning a new injury to the left anterior lower extremity. He was previously seen in 2017 here in our clinic. He states that he has done fairly well since that point. Nonetheless he is having at this time some pain but states that he hit this on a table that fell over and actually struck his leg. This appears to have pulled back some of his skin which folded in on itself and is causing some difficulty as far as that is concerned. There does  not appear to be any evidence of infection at this time. No fevers, chills, nausea, or vomiting noted at this time. He's been using dressings on his own currently without complication. 08/15/18 on evaluation today patient actually appears to be doing  somewhat better in regard to his wound of the lower [name] when compared to the first visit last week. I had to do a much more extensive debridement at that time it does appear that I'm gonna have to perform some debridement today but it does not look to be as extensive by any means. Nonetheless fortunately he does not show any signs of infection he does have discomfort at this site. I believe based on what I'm seeing currently he may benefit from Iodoflex to help keep the wound bed clean. Patient tolerated therapy without complication. Upon evaluation today the patient actually appears to be doing excellent in regard to his left lower extremity ulcer. This is much better than the previous two visits where he had a lot of necrotic tissue around the edge of the wound simply due to the fact that again there was a significant skin tear where the edge had been cleared away prior to reattaching and being able to heal appropriately. He seems to be doing much better at this point. 08/28/18 on evaluation today the patient's wound actually does appear to be showing signs of improvement. With that being said though he is improving he would likely note even greater improvement if we were able to sharply debride the wound. Nonetheless this caused him to much discomfort he tells me. 09/04/18 on evaluation today patient actually appears to be showing signs of improvement in regard to his left lower extremity ulcer. He has been tolerating the dressing changes including the wrap although he tells me at this point that the burning does last for a couple of days even with just the Iodoflex. I was afraid that this may been part of the issue that he was having with discomfort. It does seem to be the case. Nonetheless he shows no signs of evidence of infection at this time which is good news. No fevers chills noted ADMISSION to [name] 10/05/2018 This is a patient who was cared for in 2017 and again  in the fall of this year at our sister clinic and [facility]. He actually lives in [place] in [place]. We have been dealing with an apparent traumatic area on the left anterior tibial area. This is been present for the last several months. He was supposed to be using Iodoflex Kerlix and Coban however he was hospitalized from 09/05/2018 through 09/11/2018 with delirium secondary to pneumonia. Since then he is only been putting Vaseline gauze on this without compression. He also has a more recent skin tear on the dorsal right hand that may have only happened in the last week. The patient had arterial studies done in 2017 in January which was 3 years ago. At that point he had noncompressible ABIs but really quite good TBI's both normal. Triphasic waveforms on the right monophasic at the left posterior tibial but triphasic at the left dorsalis pedis. His ABIs in our clinic today were both noncompressible 1/23; the patient has wounds on the right dorsal hand just distal to the wrist and on the left anterior lower extremity. Both of these look very healthy he is using Hydrofera Blue 1/30; left anterior lower extremity wound much smaller. Healthy looking surface. The laceration area just distal to the wrist on the dorsal  hand on the right is also just about closed I used Hydrofera Blue here 2/6; left anterior lower extremity wound is much smaller but still open. The laceration area just distal to the wrist on the dorsal hand is fully epithelialized. 2/13; the patient's anterior lower extremity wound is closed. The laceration just distal to the wrist on the dorsal hand is also fully epithelialized and closed. The patient has external compression stockings which I think are 20/30 READMISSION 08/06/2019 Mr. Kimberlin is a 74 year old man with had several times previously in our clinic. He is a diabetic with a history of chronic renal insufficiency status post kidney transplant in 1983 and again in 2005. He  was then in 2017 with a laceration on the left lower extremity. He was worked up at the time with arterial studies and reflux studies. The arterial studies showed ABIs to be noncompressible but TBI's were within normal limits. I do not have the reflux studies at the moment. He was also sent here in 2019 with a left lower extremity wound and then again in 2020 with left lower extremity trauma a skin tear on the wrist. He was discharged with 20/30 stockings identified from myself that that might not be enough compression. Nevertheless he states he was wearing these fairly reliably. In September he had a fall with a substantial bruise in the area of the wound. He says he saw orthopedics and they told him there was some muscle strain sometime it later this opened into a wound. He has a fairly substantial wound on the right posterior calf. Satellite areas around this including medially and posteriorly. He has not worn his stockings since the injury Past medical history; includes chronic renal failure secondary to diabetes with kidney transplant x2, atrial fibrillation, heart failure with preserved ejection fraction, coronary artery disease. ABIs on the right in our clinic were once again noncompressible 08/13/2019 on evaluation today patient appears to be doing decently well with regard to his wound compared to last week's evaluation. Unfortunately he is still having a lot of discomfort at this point which is I think in some part due to the 3 layer compression wrap which is a little bit stronger I think for him. When he was here before we actually utilized a Kerlix and Coban wrap which he states seemed to got a little bit better. Nonetheless I think we can probably drop back to this in light of the discomfort that he had. Nonetheless the pain was not really right around the wound itself as much as it was around the ankle in particular. The Iodoflex does seem to have done well for him As the wound is  appearing somewhat better today which is excellent news. 11/30; still complaining of a lot of pain. Apparently arterial studies I ordered 2 weeks ago are below. I do not believe we have an appointment with vein and vascular as of yet; ABI Findings: +---------+------------------+-----+----------+--------+ Right Rt Pressure (mmHg)IndexWaveform Comment  +---------+------------------+-----+----------+--------+ PTA >254 1.50 monophasic  +---------+------------------+-----+----------+--------+ DP >254 1.50 monophasic  +---------+------------------+-----+----------+--------+ Great T [id number] 0.40 Abnormal   +---------+------------------+-----+----------+--------+ +---------+------------------+-----+----------+-------+ Left Lt Pressure (mmHg)IndexWaveform Comment +---------+------------------+-----+----------+-------+ Brachial 169     +---------+------------------+-----+----------+-------+ PTA >254 1.50 monophasic  +---------+------------------+-----+----------+-------+ DP >254 1.50 monophasic  +---------+------------------+-----+----------+-------+ Great T oe40 0.24 Abnormal   +---------+------------------+-----+----------+-------+ Pedal arteries appear hyperemic. Patient refused Brachial pressure in the right arm. Summary: Right: Resting right ankle-brachial index indicates noncompressible right lower extremity arteries. The right toe-brachial index is abnormal. Left: Resting left ankle-brachial index indicates noncompressible left lower  extremity arteries. The left toe-brachial index is abnormal. He is also having considerably more swelling in his left calf. This was not there when I last saw him 2 weeks ago. He tells me that some of the home health compression wraps have been slipping down and that may be the issue here however a month I am uncertain 27/2; sees vascular on December 22. Still complaining of a lot of pain. DVT study I did last  time was negative for DVT 12/14; still complaining of pain which if this is arterial is certainly claudication and rest keeps him uncomfortable at night. He has an appointment with vein and vascular on December 22. Wound surface is better using Iodoflex. Once the surface of this is satisfactory and we have exhausted the vascular route. Perhaps an advanced treatment option. He has a configuration of the venous ulceration although his arterial studies are not very good. The other issue is the patient has a transplanted kidney. This will make angiography difficult and challenging issue 12/21; still complaining of pain and drainage. We are using Iodoflex on the wound under compression. He sees Dr. Donnetta Hutching tomorrow to evaluate his noninvasive studies noted above. He has a transplanted kidney further complicating the options for angiography. 12/28; still using Iodoflex under compression. I have Dr. Luther Parody note from 12/22. he noted arterial studies revealing monophasic waveforms at the pedal vessels bilaterally and calcified vessels making the ABI unreliable. He did not comment on the reduced TBI's. He felt these were venous wounds based on the palpable dorsalis pedis pulse. 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 And they arranged for formal venous duplex with reflux studies in the next several weeks. Follow-up with either Scot Dock or Dr. Oneida Alar 09/24/2019 still using Iodoflex under compression. He has an appointment with Dr. Doren Custard on 1/7 with regards to his venous disease. The patient was not felt to have a primary arterial problem for the nonhealing of his wound. We did attempt to wrap him with 3 layer when he first came into the clinic he complained of a lot of pain in the ankle although this may have been because the dressing fell down somewhat. He has far too edema fluid in the right leg for a good prognosis about healing this wound He comes in today with an excoriation on the bottom part of his  right fifth toe. He thinks he may have done this taking off his clothes and that something got caught on the toe. There is no open wound however the toe itself is very painful 1/11; we are using Iodoflex under compression. The wound bed is clean. He went on to see Dr. Doren Custard on 09/27/2019 he again feels that the patient's arterial supply is adequate. He feels that he might benefit from right greater saphenous vein ablation for a venous ulcer. In the meantime the area that he was complaining about last week on the right fifth toe. An x-ray that I ordered showed marked soft tissue swelling along the distal aspect of the right foot but there was no evidence of osteomyelitis. He comes in today with the fifth toenail just about coming off. He has black eschar underneath the toe on the plantar aspect. The toe was swollen red and very painful. In the right setting this could be a significant soft tissue infection versus ischemia of the toe itself. He did not show this to Dr. Doren Custard 1/18; I am using Iodoflex under compression a large wound on the right posterior calf. He had his  right greater saphenous vein ablation by Dr. Doren Custard although I am not sure that is the only problem here. The right fifth toe which was possibly trauma 2 weeks ago continues to be exceptionally painful with a necrotic tip. Maybe not quite as swollen. I started him empirically on Augmentin 5/125 1 p.o. twice daily last week after discussing this with Dr. Loletha Grayer of nephrology. Perhaps somewhat better this week but not as good as I was hoping. A plain x-ray was negative. He comes in today with an area on the medial right calf that was blistered and now open. In looking at things he appears to be systemically fluid volume overloaded. He has a transplanted kidney. He states he takes his Lasix variably when he has appointments he tries not to take it the later I am really not certain if he takes this reliably. However he has far too much edema fluid  in the right leg to easily heal this wound and he appears to be developing blisters medially to form additional wounds 1/25; his CO2 angiogram was done by Dr. Doren Custard last week. This showed the proximal arteries all to be patent. On the right side the common femoral deep femoral superficial femoral popliteal anterior tibial and peroneal arteries were patent. The posterior tibial was occluded but reconstituted distally via collaterals from the peroneal artery he was felt he should have enough blood flow to heal his wounds including the right fifth toe. The right fifth toe looks better however he is still complaining of a lot of pain. The large area which is a venous ulceration posteriorly has a better looking surface I think we can switch to Hydrofera Blue today 2/1; the patient's original wounds on the right posterior medial calf has come down in width however superior to this he has new denuded areas and I am concerned we simply do not have enough edema control. He has already undergone a right greater saphenous ablation. He had a CO2 angiogram done by Dr. Doren Custard and the comment was that we should have enough blood flow to heal the venous wound however we simply do not seem to have enough edema control with 3 layer compression. The patient is status post kidney transplant although his exact renal function is not really clear. Nor am i sure what dose of diuretic he is supposed to be on. He also has the area on the right fifth toe which was unclear etiology but I think became secondarily infected I gave him 2 weeks of antibiotics for this and this seems to have settled down he still has a black eschar over the tip of the toe. X-ray was negative for fracture. He says he has a history of gout. 2/8; the patient's wound on the right posterior calf is about the same. The superficial area medially also about the same. He got a prescription for prednisoneo Gout after he developed erythema on the dorsal aspect of  his left great toe going along with the right fifth toe which has been problematic all along. He has not taken it because he is concerned about increasing CBGs. He has a transplanted kidney is already on prednisone 5 mg. He would not be a good candidate for NSAIDs. Perhaps colchicine. He is not aware of what his uric acid level is 2/15; his right posterior calf wound seems to be coming in in terms of width. Everything here looks fairly good. No mechanical debridement we have been using Hydrofera Blue. He has had an ablation by vein  and vascular. Felt to have adequate arterial supply to this area. The patient got prednisone last week from Dr. Angelique Holm of nephrology. At my urging he actually took it. The area on his left toe was a lot better. The right toe was not as painful but still erythematous with a wound at the tip. We have been using silver alginate here 2/22; right posterior calf seems to be gradually epithelializing. Still a fairly substantial wound. He still has an area on the tip of his right fifth toe which I think was gout related. This is gradually improving. We have been using Unna boots to wrap X-ray I did of the foot last time was again negative there was soft tissue irregularity about the distal fifth digit but no radiographic evidence of bone damage 3/1; right posterior calf seems to be gradually improving however there were areas of hyper granulation. We have been using Hydrofera Blue. The hyper granulation is mostly evident in the most superior finger shape projection of the wound itself. The area on the right fifth toe also was slough covered and required debridement. 3/8 continued improvement in the right posterior calf and the tip of the right fifth toe. We have been using Hydrofera Blue under compression he is changing the area to the toe 3/22; continued improvement in the right posterior calf. Unfortunately comes in today with a new skin tear in the mid anterior tibia area  this probably had something to do with changing his dressing home health I called and left this a message last week. 3/29; continued improvement in the a substantial wound on the right posterior calf. The skin tear anteriorly from last week is already healed on the tip of the right fifth toe there is still a nonviable area. 4/5; we have continued contraction of the substantial wound on the right posterior calf. We continue to have problems with the tip of the right fifth toe. We have been using Hydrofera Blue to both wound areas He is complaining about some discomfort in the Achilles area. T me he had surgery for what sounds like a torn Achilles about 10 years ago. ells 4/12; we have continued contraction of the substantial wound on the right posterior calf. Still an area on the tip of the right fifth toe. I changed him to silver collagen on the toe last week but I think home health continue to use Hydrofera Blue to both wound areas. 4/19; we continue to have contraction of the substantial wound on the right posterior calf however there continues to be hyper granulation. The area on the tip of the fifth toe is very small still some debris on the surface we have been using silver collagen 4/26; both wounds have contracted. I was hoping the fifth toe wound would close over but with probing there is still an open small hole here. 5/3; his wound on the right posterior calf which was his major area continues to contract looks healthy we have been using Hydrofera Blue. Using silver collagen to the fifth toe, not making a lot of progress here 5/10; right posterior calf wound continues to be smaller in terms of surface area and look healthy we have been using Hydrofera Blue here. The tip of the right fifth toe is still open requiring debridement. He has a new laceration on the right forearm he says he hit this on a microwave 6 days ago 02/11/20-The right fifth toe wound is closed, the right posterior calf  wound looks healthy with smaller dimensions  compared with last time, the right forearm area of laceration has a small blister which I try to open up with scalpel with very little fluid 6/7 the right fifth toe looks fine. Area on the right forearm is also closed. Right posterior calf wound continues to contract. Much smaller area remaining with healthy surface. We are using Hydrofera Blue under compression 6/21; area on the right posterior calf. There is only 1 small area in the most distal part of this. We have been using Hydrofera Blue under compression this is contracting surface looks healthy 6/28; right posterior calf this is healed. This was initially a hematoma. During the stay in this clinic he had a right greater saphenous vein ablation by Dr. Doren Custard of vein and vascular. He also had a difficult time with his right fifth toeo Infection versus gout. Very difficult time with this so that this has been closed for about 6 weeks. ------------------- READMISSION 04/29/20-Patient returns to the clinic this time with a right index finger swelling and pain been drainage, patient has been to the hand surgeon and vascular surgeon and ended up having a small IandD attempted in the office with expression of some gritty whitish material, patient also hit his hand and developed a small hematoma around this area. He also had vascular Dopplers done that show monophasic right radial right ulnar waveforms. He has follow-up appointments with vascular surgery as well as the hand surgeon. His appointment today with a hand surgeon he was told that he may lose the tip of his index finger if surgery is done to open up the entire area. He is reluctant to have any surgery and would like to try conservative measures He has tried epsom salt soaks with improvement then worsening , he has just finished a course of antibiotics, he does not remember the name 8/16; patient was readmitted to the clinic last week and I am  seeing this for the first time. Previously we had looked at this man for a large wound in the right posterior calf that was initially a hematoma. He went on to have a greater saphenous vein ablation. During the stay here he also had a difficult time with an area over his right fifth toe question infectious question gout at that time. This eventually closed over although I do not think the differential was ever really totally clear in my mind. The patient is here this time with a necrotic area on the palmar surface of his right index finger. He states this started in July with a small red area. He spent some time going to see Dr. Jarome Matin of dermatology. Ended up with Dr. 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 He is also seen hand surgery which is fortunate is he is likely to require either debridement or amputation of this finger. He had a course of antibiotics Keflex and doxycycline I believe but he is finished these. Currently using Betadine. He describes this is very painful. I have not had a chance to look back over notes in care everywhere. I would like to see as hand surgery notes, vascular surgery notes and dermatology notes if this is possible. He also apparently had an x-ray ordered by Dr. Amedeo Plenty that I would like to review. 8/29; certainly no improvement in his right index finger palmar aspect. Culture I did last time showed abundant Enterobacter and abundant Proteus. I have him on cefdinir 300 mg every 24 as of Friday in response to  this. This was a deep tissue culture. 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 He still complaining of a lot of pain. Electronic Signature(s) Signed: 05/12/2020 4:33:08 PM By: [name] MD Entered By: [name] on 05/12/2020 09:39:58 -------------------------------------------------------------------------------- Physical Exam Details Patient  Name: Date of Service: MICHAE, GRIMLEY HA M 05/12/2020 8:45 A M Medical Record Number: [id number] Patient Account Number: [id number] Date of Birth/Sex: Treating RN: Apr 29, 1946 (74 y.o. [name] Primary Care Provider: [name] Other Clinician: Referring Provider: Treating Provider/Extender: [name] in Treatment: 1 Constitutional Sitting or standing Blood Pressure is within target range for patient.. Pulse regular and within target range for patient.Marland Kitchen Respirations regular, non-labored and within target range.. Temperature is normal and within the target range for the patient.Marland Kitchen Appears in no distress. Notes Wound exam; palmar aspect of the right index finger. T otally covered in necrotic tissue. This is far too painful for any outpatient procedure although I did do a digital block last week to do a deep tissue culture I am not going to do this again. The finger itself is warm. 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 Electronic Signature(s) Signed: 05/12/2020 4:33:08 PM By: [name] MD Entered By: [name] on 05/12/2020 09:41:02 -------------------------------------------------------------------------------- Physician Orders Details Patient Name: Date of Service: KALIF, KATTNER HA M 05/12/2020 8:45 A M Medical Record Number: [id number] Patient Account Number: [id number] Date of Birth/Sex: Treating RN: August 10, 1946 (73 y.o. 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 Dressing Change Frequency Wound #11 Hand - 2nd Digit Change dressing every day. Wound Cleansing Wound #11 Hand - 2nd Digit May shower and wash wound with soap and water. Primary Wound Dressing Wound #11 Hand - 2nd Digit Calcium Alginate with Silver Secondary Dressing Wound #11 Hand - 2nd Digit Kerlix/Rolled Gauze Dry Gauze Electronic Signature(s) Signed: 05/12/2020 4:21:43 PM By: [name] RN, BSN Signed: 05/12/2020 4:33:08 PM By: [name] MD Entered By: [name] on 05/12/2020 09:32:41 -------------------------------------------------------------------------------- Problem List Details Patient Name: Date of Service: MERIT, MAYBEE HA M 05/12/2020 8:45 A M Medical Record Number: [id number] Patient Account Number: [id number] Date of Birth/Sex: Treating RN: 1945-10-16 (74 y.o. 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 [name] Primary Care Provider: [name] Other Clinician: Referring Provider: Treating Provider/Extender:  [name] in  Treatment: 1 Subjective History of Present Illness (HPI) [place] HPI Description: 74 year old gentleman who was recently seen by his nephrologist [name], and noted to have a wound on his left lower extremity which was lacerated 2 months ago and now has reopened. The patient's left shin has a ulceration with some exudate but no evidence of infection and he was referred to [place] for further care as it was known that the patient has had some peripheral vascular disease in the past. Past medical history significant for chronic kidney disease, atrial fibrillation, diabetes mellitus,status post kidney transplant in 1983 and 2005, a week fistula graft placement, status post previous bowel surgery. 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 10/06/2015 -- x-ray of the left tibia and fibula shows no evidence of osteomyelitis. The patient has also had Doppler studies of his extremity and is awaiting the appointment with the vascular surgeon. We have not yet received these reports. 10/13/2015 -- lower extremity venous duplex reflux evaluation shows reflux in the left common femoral vein, left saphenofemoral junction and the proximal greater saphenous vein extending to the proximal calf. There is also reflux in the left proximal to mid small saphenous vein. Arterial duplex studies done showed the resting ABI was not applicable due to tibial artery medial calcification. 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 The right TBI was 1 which is normal and the left ABI was 1 which is normal. Patient has otherwise been doing fine and has been compliant with his dressings. 10/20/2015 -- He was seen by [name] recently for a vascular opinion on 10/15/2015. His left lower extremity venous  insufficiency duplex study revealed GSV reflux,SS vein reflux and deep venous reflux in the common femoral vein. His ABIs were non compressible and his TBI on the right was 1.01 and on the left was 0.80. He was asked to continue with the wound care with compressive therapy followed by EVLA of the left GS vein 3 months. He recommended 20-30 mm thigh- high compression stockings and the need for a three-month trial of this. The patient had an Unna boot applied at the vascular office but he could not tolerate this with a lot of pain and issues with his toes and hence came here on Friday for removal of this and we reapplied a 2 layer compression. 11/10/2015 -- patient still has not purchased his 20-30 mm thigh-high compression stockings as prescribed by [name]. Readmission: 08/08/18 on evaluation today patient presents for readmission concerning a new injury to the left anterior lower extremity. He was previously seen in 2017 here in our clinic. He states that he has done fairly well since that point. Nonetheless he is having at this time some pain but states that he hit this on a table that fell over and actually struck his leg. This appears to have pulled back some of his skin which folded in on itself and is causing some difficulty as far as that is concerned. There does not appear to be any evidence of infection at this time. No fevers, chills, nausea, or vomiting noted at this time. He's been using dressings on his own currently without complication. 08/15/18 on evaluation today patient actually appears to be doing somewhat better in regard to his wound of the lower [name] when compared to the first visit last week. I  had to do a much more extensive debridement at that time it does appear that I'm gonna have to perform some debridement today but it does not look to be as extensive by any means. Nonetheless fortunately he does not show any signs of infection he does have discomfort at this site. I  believe based on what I'm seeing currently he may benefit from Iodoflex to help keep the wound bed clean. Patient tolerated therapy without complication. Upon evaluation today the patient actually appears to be doing excellent in regard to his left lower extremity ulcer. This is much better than the previous two visits where he had a lot of necrotic tissue around the edge of the wound simply due to the fact that again there was a significant skin tear where the edge had been cleared away prior to reattaching and being able to heal appropriately. He seems to be doing much better at this point. 08/28/18 on evaluation today the patient's wound actually does appear to be showing signs of improvement. With that being said though he is improving he would likely note even greater improvement if we were able to sharply debride the wound. Nonetheless this caused him to much discomfort he tells me. 09/04/18 on evaluation today patient actually appears to be showing signs of improvement in regard to his left lower extremity ulcer. He has been tolerating the dressing changes including the wrap although he tells me at this point that the burning does last for a couple of days even with just the Iodoflex. I was afraid that this may been part of the issue that he was having with discomfort. It does seem to be the case. Nonetheless he shows no signs of evidence of infection at this time which is good news. No fevers chills noted ADMISSION to [name] 10/05/2018 This is a patient who was cared for in 2017 and again in the fall of this year at our sister clinic and [place]. He actually lives in [place] in [place]. We have been dealing with an apparent traumatic area on the left anterior tibial area. This is been present for the last several months. He was supposed to be using Iodoflex Kerlix and Coban however he was hospitalized from 09/05/2018 through 09/11/2018 with delirium secondary to  pneumonia. Since then he is only been putting Vaseline gauze on this without compression. He also has a more recent skin tear on the dorsal right hand that may have only happened in the last week. The patient had arterial studies done in 2017 in January which was 3 years ago. At that point he had noncompressible ABIs but really quite good TBI's both normal. Triphasic waveforms on the right monophasic at the left posterior tibial but triphasic at the left dorsalis pedis. His ABIs in our clinic today were both noncompressible 1/23; the patient has wounds on the right dorsal hand just distal to the wrist and on the left anterior lower extremity. Both of these look very healthy he is using Hydrofera Blue 1/30; left anterior lower extremity wound much smaller. Healthy looking surface. The laceration area just distal to the wrist on the dorsal hand on the right is also just about closed I used Hydrofera Blue here 2/6; left anterior lower extremity wound is much smaller but still open. The laceration area just distal to the wrist on the dorsal hand is fully epithelialized. 2/13; the patient's anterior lower extremity wound is closed. The laceration just distal to the wrist on the dorsal hand  is also fully epithelialized and closed. The patient has external compression stockings which I think are 20/30 READMISSION 08/06/2019 Mr. Mundie is a 74 year old man with had several times previously in our clinic. He is a diabetic with a history of chronic renal insufficiency status post kidney transplant in 1983 and again in 2005. He was then in 2017 with a laceration on the left lower extremity. He was worked up at the time with arterial studies and reflux studies. The arterial studies showed ABIs to be noncompressible but TBI's were within normal limits. I do not have the reflux studies at the moment. He was also sent here in 2019 with a left lower extremity wound and then again in 2020 with left lower extremity  trauma a skin tear on the wrist. He was discharged with 20/30 stockings identified from myself that that might not be enough compression. Nevertheless he states he was wearing these fairly reliably. In September he had a fall with a substantial bruise in the area of the wound. He says he saw orthopedics and they told him there was some muscle strain sometime it later this opened into a wound. He has a fairly substantial wound on the right posterior calf. Satellite areas around this including medially and posteriorly. He has not worn his stockings since the injury Past medical history; includes chronic renal failure secondary to diabetes with kidney transplant x2, atrial fibrillation, heart failure with preserved ejection fraction, coronary artery disease. ABIs on the right in our clinic were once again noncompressible 08/13/2019 on evaluation today patient appears to be doing decently well with regard to his wound compared to last week's evaluation. Unfortunately he is still having a lot of discomfort at this point which is I think in some part due to the 3 layer compression wrap which is a little bit stronger I think for him. When he was here before we actually utilized a Kerlix and Coban wrap which he states seemed to got a little bit better. Nonetheless I think we can probably drop back to this in light of the discomfort that he had. Nonetheless the pain was not really right around the wound itself as much as it was around the ankle in particular. The Iodoflex does seem to have done well for him As the wound is appearing somewhat better today which is excellent news. 11/30; still complaining of a lot of pain. Apparently arterial studies I ordered 2 weeks ago are below. I do not believe we have an appointment with vein and vascular as of yet; ABI Findings: +---------+------------------+-----+----------+--------+ Right Rt Pressure (mmHg)IndexWaveform Comment   +---------+------------------+-----+----------+--------+ PTA >254 1.50 monophasic  +---------+------------------+-----+----------+--------+ DP >254 1.50 monophasic  +---------+------------------+-----+----------+--------+ Great T oe68 0.40 Abnormal   +---------+------------------+-----+----------+--------+ +---------+------------------+-----+----------+-------+ Left Lt Pressure (mmHg)IndexWaveform Comment +---------+------------------+-----+----------+-------+ Brachial 169     +---------+------------------+-----+----------+-------+ PTA >254 1.50 monophasic  +---------+------------------+-----+----------+-------+ DP >254 1.50 monophasic  +---------+------------------+-----+----------+-------+ Great T oe40 0.24 Abnormal   +---------+------------------+-----+----------+-------+ Pedal arteries appear hyperemic. Patient refused Brachial pressure in the right arm. Summary: Right: Resting right ankle-brachial index indicates noncompressible right lower extremity arteries. The right toe-brachial index is abnormal. Left: Resting left ankle-brachial index indicates noncompressible left lower extremity arteries. The left toe-brachial index is abnormal. He is also having considerably more swelling in his left calf. This was not there when I last saw him 2 weeks ago. He tells me that some of the home health compression wraps have been slipping down and that may be the issue here however a month I am uncertain 62/2; sees  vascular on December 22. Still complaining of a lot of pain. DVT study I did last time was negative for DVT 12/14; still complaining of pain which if this is arterial is certainly claudication and rest keeps him uncomfortable at night. He has an appointment with vein and vascular on December 22. Wound surface is better using Iodoflex. Once the surface of this is satisfactory and we have exhausted the vascular route. Perhaps an advanced  treatment option. He has a configuration of the venous ulceration although his arterial studies are not very good. The other issue is the patient has a transplanted kidney. This will make angiography difficult and challenging issue 12/21; still complaining of pain and drainage. We are using Iodoflex on the wound under compression. He sees Dr. Donnetta Hutching tomorrow to evaluate his noninvasive studies noted above. He has a transplanted kidney further complicating the options for angiography. 12/28; still using Iodoflex under compression. I have Dr. Luther Parody note from 12/22. he noted arterial studies revealing monophasic waveforms at the pedal vessels bilaterally and calcified vessels making the ABI unreliable. He did not comment on the reduced TBI's. He felt these were venous wounds based on the palpable dorsalis pedis pulse. 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 And they arranged for formal venous duplex with reflux studies in the next several weeks. Follow-up with either Scot Dock or Dr. Oneida Alar 09/24/2019 still using Iodoflex under compression. He has an appointment with Dr. Doren Custard on 1/7 with regards to his venous disease. The patient was not felt to have a primary arterial problem for the nonhealing of his wound. We did attempt to wrap him with 3 layer when he first came into the clinic he complained of a lot of pain in the ankle although this may have been because the dressing fell down somewhat. He has far too edema fluid in the right leg for a good prognosis about healing this wound He comes in today with an excoriation on the bottom part of his right fifth toe. He thinks he may have done this taking off his clothes and that something got caught on the toe. There is no open wound however the toe itself is very painful 1/11; we are using Iodoflex under compression. The wound bed is clean. He went on to see Dr. Doren Custard on 09/27/2019 he again feels that the patient's arterial supply is adequate. He feels  that he might benefit from right greater saphenous vein ablation for a venous ulcer. In the meantime the area that he was complaining about last week on the right fifth toe. An x-ray that I ordered showed marked soft tissue swelling along the distal aspect of the right foot but there was no evidence of osteomyelitis. He comes in today with the fifth toenail just about coming off. He has black eschar underneath the toe on the plantar aspect. The toe was swollen red and very painful. In the right setting this could be a significant soft tissue infection versus ischemia of the toe itself. He did not show this to Dr. Doren Custard 1/18; I am using Iodoflex under compression a large wound on the right posterior calf. He had his right greater saphenous vein ablation by Dr. Doren Custard although I am not sure that is the only problem here. ooThe right fifth toe which was possibly trauma 2 weeks ago continues to be exceptionally painful with a necrotic tip. Maybe not quite as swollen. I started him empirically on Augmentin 5/125 1 p.o. twice daily last week after discussing this with  Dr. Loletha Grayer of nephrology. Perhaps somewhat better this week but not as good as I was hoping. A plain x-ray was negative. He comes in today with an area on the medial right calf that was blistered and now open. In looking at things he appears to be systemically fluid volume overloaded. He has a transplanted kidney. He states he takes his Lasix variably when he has appointments he tries not to take it the later I am really not certain if he takes this reliably. However he has far too much edema fluid in the right leg to easily heal this wound and he appears to be developing blisters medially to form additional wounds 1/25; his CO2 angiogram was done by Dr. Doren Custard last week. This showed the proximal arteries all to be patent. On the right side the common femoral deep femoral superficial femoral popliteal anterior tibial and peroneal arteries were  patent. The posterior tibial was occluded but reconstituted distally via collaterals from the peroneal artery he was felt he should have enough blood flow to heal his wounds including the right fifth toe. The right fifth toe looks better however he is still complaining of a lot of pain. The large area which is a venous ulceration posteriorly has a better looking surface I think we can switch to Hydrofera Blue today 2/1; the patient's original wounds on the right posterior medial calf has come down in width however superior to this he has new denuded areas and I am concerned we simply do not have enough edema control. He has already undergone a right greater saphenous ablation. He had a CO2 angiogram done by Dr. Doren Custard and the comment was that we should have enough blood flow to heal the venous wound however we simply do not seem to have enough edema control with 3 layer compression. The patient is status post kidney transplant although his exact renal function is not really clear. Nor am i sure what dose of diuretic he is supposed to be on. He also has the area on the right fifth toe which was unclear etiology but I think became secondarily infected I gave him 2 weeks of antibiotics for this and this seems to have settled down he still has a black eschar over the tip of the toe. X-ray was negative for fracture. He says he has a history of gout. 2/8; the patient's wound on the right posterior calf is about the same. The superficial area medially also about the same. He got a prescription for prednisoneo Gout after he developed erythema on the dorsal aspect of his left great toe going along with the right fifth toe which has been problematic all along. He has not taken it because he is concerned about increasing CBGs. He has a transplanted kidney is already on prednisone 5 mg. He would not be a good candidate for NSAIDs. Perhaps colchicine. He is not aware of what his uric acid level is 2/15; his right  posterior calf wound seems to be coming in in terms of width. Everything here looks fairly good. No mechanical debridement we have been using Hydrofera Blue. He has had an ablation by vein and vascular. Felt to have adequate arterial supply to this area. The patient got prednisone last week from Dr. Angelique Holm of nephrology. At my urging he actually took it. The area on his left toe was a lot better. The right toe was not as painful but still erythematous with a wound at the tip. We have been using silver alginate  here 2/22; right posterior calf seems to be gradually epithelializing. Still a fairly substantial wound. He still has an area on the tip of his right fifth toe which I think was gout related. This is gradually improving. We have been using Unna boots to wrap X-ray I did of the foot last time was again negative there was soft tissue irregularity about the distal fifth digit but no radiographic evidence of bone damage 3/1; right posterior calf seems to be gradually improving however there were areas of hyper granulation. We have been using Hydrofera Blue. The hyper granulation is mostly evident in the most superior finger shape projection of the wound itself. ooThe area on the right fifth toe also was slough covered and required debridement. 3/8 continued improvement in the right posterior calf and the tip of the right fifth toe. We have been using Hydrofera Blue under compression he is changing the area to the toe 3/22; continued improvement in the right posterior calf. Unfortunately comes in today with a new skin tear in the mid anterior tibia area this probably had something to do with changing his dressing home health I called and left this a message last week. 3/29; continued improvement in the a substantial wound on the right posterior calf. The skin tear anteriorly from last week is already healed on the tip of the right fifth toe there is still a nonviable area. 4/5; we have  continued contraction of the substantial wound on the right posterior calf. We continue to have problems with the tip of the right fifth toe. We have been using Hydrofera Blue to both wound areas He is complaining about some discomfort in the Achilles area. T me he had surgery for what sounds like a torn Achilles about 10 years ago. ells 4/12; we have continued contraction of the substantial wound on the right posterior calf. ooStill an area on the tip of the right fifth toe. I changed him to silver collagen on the toe last week but I think home health continue to use Hydrofera Blue to both wound areas. 4/19; we continue to have contraction of the substantial wound on the right posterior calf however there continues to be hyper granulation. ooThe area on the tip of the fifth toe is very small still some debris on the surface we have been using silver collagen 4/26; both wounds have contracted. I was hoping the fifth toe wound would close over but with probing there is still an open small hole here. 5/3; his wound on the right posterior calf which was his major area continues to contract looks healthy we have been using Hydrofera Blue. Using silver collagen to the fifth toe, not making a lot of progress here 5/10; right posterior calf wound continues to be smaller in terms of surface area and look healthy we have been using Hydrofera Blue here. The tip of the right fifth toe is still open requiring debridement. He has a new laceration on the right forearm he says he hit this on a microwave 6 days ago 02/11/20-The right fifth toe wound is closed, the right posterior calf wound looks healthy with smaller dimensions compared with last time, the right forearm area of laceration has a small blister which I try to open up with scalpel with very little fluid 6/7 the right fifth toe looks fine. Area on the right forearm is also closed. Right posterior calf wound continues to contract. Much smaller area  remaining with healthy surface. We are using Hydrofera Blue under  compression 6/21; area on the right posterior calf. There is only 1 small area in the most distal part of this. We have been using Hydrofera Blue under compression this is contracting surface looks healthy 6/28; right posterior calf this is healed. This was initially a hematoma. During the stay in this clinic he had a right greater saphenous vein ablation by Dr. Doren Custard of vein and vascular. He also had a difficult time with his right fifth toeo Infection versus gout. Very difficult time with this so that this has been closed for about 6 weeks. ------------------- READMISSION 04/29/20-Patient returns to the clinic this time with a right index finger swelling and pain been drainage, patient has been to the hand surgeon and vascular surgeon and ended up having a small IandD attempted in the office with expression of some gritty whitish material, patient also hit his hand and developed a small hematoma around this area. He also had vascular Dopplers done that show monophasic right radial right ulnar waveforms. He has follow-up appointments with vascular surgery as well as the hand surgeon. His appointment today with a hand surgeon he was told that he may lose the tip of his index finger if surgery is done to open up the entire area. He is reluctant to have any surgery and would like to try conservative measures He has tried epsom salt soaks with improvement then worsening , he has just finished a course of antibiotics, he does not remember the name 8/16; patient was readmitted to the clinic last week and I am seeing this for the first time. Previously we had looked at this man for a large wound in the right posterior calf that was initially a hematoma. He went on to have a greater saphenous vein ablation. During the stay here he also had a difficult time with an area over his right fifth toe question infectious question gout at that time.  This eventually closed over although I do not think the differential was ever really totally clear in my mind. The patient is here this time with a necrotic area on the palmar surface of his right index finger. He states this started in July with a small red area. He spent some time going to see Dr. Jarome Matin of dermatology. Ended up with Dr. 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 He is also seen hand surgery which is fortunate is he is likely to require either debridement or amputation of this finger. He had a course of antibiotics Keflex and doxycycline I believe but he is finished these. Currently using Betadine. He describes this is very painful. I have not had a chance to look back over notes in care everywhere. I would like to see as hand surgery notes, vascular surgery notes and dermatology notes if this is possible. He also apparently had an x-ray ordered by Dr. Amedeo Plenty that I would like to review. 5/62; certainly no improvement in his right index finger palmar aspect. Culture I did last time showed abundant Enterobacter and abundant Proteus. I have him on cefdinir 300 mg every 24 as of Friday in response to this. This was a deep tissue culture. 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 He still complaining of a lot of pain. Objective Constitutional Sitting or standing Blood Pressure is within target range for patient.. Pulse regular and within target range for patient.Marland Kitchen Respirations regular, non-labored and within target range.. Temperature is normal and within  the target range for the patient.Marland Kitchen Appears in no distress. Vitals Time Taken: 9:05 AM, Height: 68 in, Weight: 185 lbs, BMI: 28.1, Temperature: 98.0 F, Pulse: 45 bpm, Respiratory Rate: 18 breaths/min, Blood Pressure: 120/66 mmHg, Capillary Blood Glucose: 70 mg/dl. General Notes: Wound exam; palmar aspect of the right index finger.  T otally covered in necrotic tissue. This is far too painful for any outpatient procedure although I did do a digital block last week to do a deep tissue culture I am not going to do this again. The finger itself is warm. 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 Integumentary (Hair, Skin) Wound #11 status is Open. Original cause of wound was Gradually Appeared. The wound is located on the Hand - 2nd Digit. The wound measures 2.5cm length x 2.5cm width x 0.2cm depth; 4.909cm^2 area and 0.982cm^3 volume. There is Fat Layer (Subcutaneous Tissue) exposed. There is a small amount of serous drainage noted. The wound margin is flat and intact. There is no granulation within the wound bed. There is a large (67-100%) amount of necrotic tissue within the wound bed including Eschar and Adherent Slough. 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 Dressing Change Frequency: Wound #11 Hand - 2nd Digit: Change dressing every day. Wound Cleansing: Wound #11 Hand - 2nd Digit: May shower and wash wound with soap and water. Primary Wound Dressing: Wound #11 Hand - 2nd Digit: Calcium Alginate with Silver Secondary Dressing: Wound #11 Hand - 2nd Digit: Kerlix/Rolled Gauze Dry Gauze 1. Although I have not read Dr. Mee Hives notes, he is not felt to have any macrovascular disease in the right arm although he might have microvascular disease 2. He has considerable tissue necrosis in the plantar tip of his right finger. This either needs surgical debridement or perhaps amputation through the DIP. I put him on antibiotics on Friday but I do not think there is this is going to be helpful. He certainly cannot have any  debridement in this clinic. 3. Continue cefdinir 4. He sees hand surgery tomorrow. I view this is a surgical problem although I am not exactly sure of the tissue pathology. I suspect he will need an amputation Electronic Signature(s) Signed: 05/12/2020 4:33:08 PM By: [name] MD Entered By: [name] on 05/12/2020 09:42:42 -------------------------------------------------------------------------------- SuperBill Details Patient Name: Date of Service: JERICHO, ALCORN HA M 05/12/2020 Medical Record Number: [id number] Patient Account Number: [id number] Date of Birth/Sex: Treating RN: 17-Jun-1946 (74 y.o. [name] Primary Care Provider: [name] Other Clinician: Referring Provider: Treating Provider/Extender: [name] in Treatment: 1 Diagnosis Coding ICD-10 Codes Code Description 5311188447 Non-pressure chronic ulcer of skin of other sites limited to breakdown of skin M10.341 Gout due to renal impairment, right hand Facility Procedures CPT4 Code: 42683419 Description: [place] VISIT-LEV 3 EST PT Modifier: Quantity: 1 Physician Procedures : CPT4 Code Description Modifier [id number] - WC PHYS LEVEL 3 - EST PT ICD-10 Diagnosis Description L98.491 Non-pressure chronic ulcer of skin of other sites limited to breakdown of skin Quantity: 1 Electronic Signature(s) Signed: 05/12/2020 4:21:43 PM By: [name] RN, BSN Signed: 05/12/2020 4:33:08 PM By: [name] MD Entered By: [name] on 05/12/2020 11:29:51

## 2020-05-13 NOTE — Telephone Encounter (Signed)
Left message to call back  Kerin Ransom PA-C 05/13/2020 4:25 PM

## 2020-05-13 NOTE — Progress Notes (Signed)
HASAAN, RADDE ([id number]) Visit Report for 05/12/2020 Arrival Information Details Patient Name: Date of Service: [name], [name] 05/12/2020 8:45 A M Medical Record Number: [id number] Patient Account Number: [id number] Date of Birth/Sex: Treating RN: [birth year] (74 y.o. [name] Primary Care [name]: [name] Other Clinician: Referring [name]: Treating [name]/Extender: [name] in Treatment: 1 Visit Information History Since Last Visit Added or deleted any medications: No Patient Arrived: Ambulatory Had a fall or experienced change in No Arrival Time: 09:04 activities of daily living that may affect Accompanied By: self risk of falls: Transfer Assistance: None Signs or symptoms of abuse/neglect since last visito No Patient Identification Verified: Yes Hospitalized since last visit: No Secondary Verification Process Completed: Yes Implantable device outside of the clinic excluding No Patient Requires Transmission-Based Precautions: No cellular tissue based products placed in the center Patient Has Alerts: No since last visit: Has Dressing in Place as Prescribed: Yes Pain Present Now: Yes Electronic Signature(s) Signed: 05/12/2020 2:53:33 PM By: [name] Entered By: [name] on 05/12/2020 09:04:52 -------------------------------------------------------------------------------- Clinic Level of Care Assessment Details Patient Name: Date of Service: [name], [name] 05/12/2020 8:45 A M Medical Record Number: [id number] Patient Account Number: [id number] Date of Birth/Sex: Treating RN: 11-Apr-1946 (74 y.o. [name] Primary Care Devlin Mcveigh: [name] Other Clinician: Referring Brendt Dible: Treating Viviane Semidey/Extender: [name] in Treatment: 1 Clinic Level of Care Assessment Items TOOL 4 Quantity Score X- 1 0 Use when only an EandM is performed on  FOLLOW-UP visit ASSESSMENTS - Nursing Assessment / Reassessment X- 1 10 Reassessment of Co-morbidities (includes updates in patient status) X- 1 5 Reassessment of Adherence to Treatment Plan ASSESSMENTS - Wound and Skin A ssessment / Reassessment X - Simple Wound Assessment / Reassessment - one wound 1 5 []  - 0 Complex Wound Assessment / Reassessment - multiple wounds []  - 0 Dermatologic / Skin Assessment (not related to wound area) ASSESSMENTS - Focused Assessment []  - 0 Circumferential Edema Measurements - multi extremities []  - 0 Nutritional Assessment / Counseling / Intervention []  - 0 Lower Extremity Assessment (monofilament, tuning fork, pulses) []  - 0 Peripheral Arterial Disease Assessment (using hand held doppler) ASSESSMENTS - Ostomy and/or Continence Assessment and Care []  - 0 Incontinence Assessment and Management []  - 0 Ostomy Care Assessment and Management (repouching, etc.) PROCESS - Coordination of Care X - Simple Patient / Family Education for ongoing care 1 15 []  - 0 Complex (extensive) Patient / Family Education for ongoing care X- 1 10 Staff obtains Consents, Records, T Results / Process Orders est []  - 0 Staff telephones HHA, Nursing Homes / Clarify orders / etc []  - 0 Routine Transfer to another Facility (non-emergent condition) []  - 0 Routine Hospital Admission (non-emergent condition) []  - 0 New Admissions / Biomedical engineer / Ordering NPWT Apligraf, etc. , []  - 0 Emergency Hospital Admission (emergent condition) X- 1 10 Simple Discharge Coordination []  - 0 Complex (extensive) Discharge Coordination PROCESS - Special Needs []  - 0 Pediatric / Minor Patient Management []  - 0 Isolation Patient Management []  - 0 Hearing / Language / Visual special needs []  - 0 Assessment of Community assistance (transportation, D/C planning, etc.) []  - 0 Additional assistance / Altered mentation []  - 0 Support Surface(s) Assessment (bed, cushion,  seat, etc.) INTERVENTIONS - Wound Cleansing / Measurement X - Simple Wound Cleansing - one wound 1 5 []  - 0 Complex Wound Cleansing - multiple wounds X- 1  5 Wound Imaging (photographs - any number of wounds) []  - 0 Wound Tracing (instead of photographs) X- 1 5 Simple Wound Measurement - one wound []  - 0 Complex Wound Measurement - multiple wounds INTERVENTIONS - Wound Dressings X - Small Wound Dressing one or multiple wounds 1 10 []  - 0 Medium Wound Dressing one or multiple wounds []  - 0 Large Wound Dressing one or multiple wounds X- 1 5 Application of Medications - topical []  - 0 Application of Medications - injection INTERVENTIONS - Miscellaneous []  - 0 External ear exam []  - 0 Specimen Collection (cultures, biopsies, blood, body fluids, etc.) []  - 0 Specimen(s) / Culture(s) sent or taken to Lab for analysis []  - 0 Patient Transfer (multiple staff / Civil Service fast streamer / Similar devices) []  - 0 Simple Staple / Suture removal (25 or less) []  - 0 Complex Staple / Suture removal (26 or more) []  - 0 Hypo / Hyperglycemic Management (close monitor of Blood Glucose) []  - 0 Ankle / Brachial Index (ABI) - do not check if billed separately X- 1 5 Vital Signs Has the patient been seen at the hospital within the last three years: Yes Total Score: 90 Level Of Care: New/Established - Level 3 Electronic Signature(s) Signed: 05/12/2020 4:21:43 PM By: [name] RN, BSN Entered By: [name] on 05/12/2020 09:38:10 -------------------------------------------------------------------------------- Encounter Discharge Information Details Patient Name: Date of Service: [name], [name] HA M 05/12/2020 8:45 A M Medical Record Number: [id number] Patient Account Number: [id number] Date of Birth/Sex: Treating RN: Mar 23, 1946 (74 y.o. [name] Primary Care Eoin Willden: [name] Other Clinician: Referring Sherril Heyward: Treating Daisia Slomski/Extender: [name] in Treatment: 1 Encounter Discharge Information Items Discharge Condition: Stable Ambulatory Status: Ambulatory Discharge Destination: Home Transportation: Private Auto Accompanied By: self Schedule Follow-up Appointment: Yes Clinical Summary of Care: Patient Declined Electronic Signature(s) Signed: 05/12/2020 4:21:44 PM By: [name] RN, BSN Entered By: [name] on 05/12/2020 09:56:11 -------------------------------------------------------------------------------- Lower Extremity Assessment Details Patient Name: Date of Service: [name], [name] HA M 05/12/2020 8:45 A M Medical Record Number: [id number] Patient Account Number: [id number] Date of Birth/Sex: Treating RN: 20-May-1946 (74 y.o. [name] Primary Care Awad Gladd: [name] Other Clinician: Referring Jenisis Harmsen: Treating Gerardo Caiazzo/Extender: [name] in Treatment: 1 Electronic Signature(s) Signed: 05/12/2020 4:06:40 PM By: Kela Millin Entered By: Kela Millin on 05/12/2020 09:14:13 -------------------------------------------------------------------------------- Multi Wound Chart Details Patient Name: Date of Service: [name], [name] HA M 05/12/2020 8:45 A M Medical Record Number: [id number] Patient Account Number: [id number] Date of Birth/Sex: Treating RN: 28-Feb-1946 (74 y.o. 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] [N/A:N/A] Primary Etiology:  [11:Cataracts, Anemia, Arrhythmia,] [N/A:N/A] Comorbid History: [11:Congestive Heart Failure, Hypertension, Type II Diabetes, Gout, Neuropathy 04/20/2020] [N/A:N/A] Date Acquired: [11:1] [N/A:N/A] Weeks of Treatment: [11:Open] [N/A:N/A] Wound Status: [11:2.5x2.5x0.2] [N/A:N/A] Measurements L x W x D (cm) [11:4.909] [N/A:N/A] A (cm) : rea [11:0.982] [N/A:N/A] Volume (cm) : [11:-8.50%] [N/A:N/A] % Reduction in Area: [11:-8.50%] [N/A:N/A] % Reduction in Volume: [11:Full Thickness Without Exposed] [N/A:N/A] Classification: [11:Support Structures Small] [N/A:N/A] Exudate A mount: [11:Serous] [N/A:N/A] Exudate Type: [11:amber] [N/A:N/A] Exudate Color: [11:Flat and Intact] [N/A:N/A] Wound  Margin: [11:None Present (0%)] [N/A:N/A] Granulation Amount: [11:Large (67-100%)] [N/A:N/A] Necrotic Amount: [11:Eschar, Adherent Slough] [N/A:N/A] Necrotic Tissue: [11:Fat Layer (Subcutaneous Tissue): Yes N/A] Exposed Structures: [11:Fascia: No Tendon: No Muscle: No Joint: No Bone: No Small (1-33%)] [N/A:N/A] Treatment Notes Electronic Signature(s) Signed: 05/12/2020 4:21:43 PM By: [name] RN, BSN Signed: 05/12/2020 4:33:08 PM By: [name] MD Entered By: [name] on 05/12/2020 09:38:41 -------------------------------------------------------------------------------- Multi-Disciplinary Care Plan Details Patient Name: Date of Service: [name], [name] HA M 05/12/2020 8:45 A M Medical Record Number: [id number] Patient Account Number: [id number] Date of Birth/Sex: Treating RN: 06/16/1946 (75 y.o. [name] Primary Care Antavia Tandy: [name] Other Clinician: Referring Lavella Myren: Treating Areebah Meinders/Extender: [name] in Treatment: 1 Active Inactive Wound/Skin Impairment Nursing Diagnoses: Knowledge deficit related to ulceration/compromised skin integrity Goals: Patient/caregiver will verbalize understanding of skin care regimen Date  Initiated: 04/29/2020 Target Resolution Date: 05/30/2020 Goal Status: Active Ulcer/skin breakdown will have a volume reduction of 30% by week 4 Date Initiated: 04/29/2020 Target Resolution Date: 05/30/2020 Goal Status: Active Interventions: Assess patient/caregiver ability to obtain necessary supplies Assess patient/caregiver ability to perform ulcer/skin care regimen upon admission and as needed Assess ulceration(s) every visit Notes: Electronic Signature(s) Signed: 05/12/2020 4:21:43 PM By: [name] RN, BSN Entered By: [name] on 05/12/2020 09:32:53 -------------------------------------------------------------------------------- Pain Assessment Details Patient Name: Date of Service: [name], [name] HA M 05/12/2020 8:45 A M Medical Record Number: [id number] Patient Account Number: [id number] Date of Birth/Sex: Treating RN: April 09, 1946 (74 y.o. [name] Primary Care Ozil Stettler: [name] Other Clinician: Referring Darcy [name]: Treating Paytan Recine/Extender: [name] in Treatment: 1 Active Problems Location of Pain Severity and Description of Pain Patient Has Paino Yes Site Locations Rate the pain. Current Pain Level: 3 Pain Management and Medication Current Pain Management: Electronic Signature(s) Signed: 05/12/2020 2:53:33 PM By: [name] Signed: 05/12/2020 4:21:43 PM By: [name] RN, BSN Entered By: [name] on 05/12/2020 09:08:40 -------------------------------------------------------------------------------- Patient/Caregiver Education Details Patient Name: Date of Service: [name], [name] HA M 8/23/2021andnbsp8:45 A M Medical Record Number: [id number] Patient Account Number: [id number] Date of Birth/Gender: Treating RN: 1946-09-17 (74 y.o. [name] Primary Care Physician: [name] Other Clinician: Referring Physician: Treating Physician/Extender: [name] in Treatment: 1 Education Assessment Education Provided To: Patient Education Topics Provided Wound/Skin Impairment: Methods: Explain/Verbal Responses: State content correctly Electronic Signature(s) Signed: 05/12/2020 4:21:43 PM By: [name] RN, BSN Entered By: [name] on 05/12/2020 09:33:05 -------------------------------------------------------------------------------- Wound Assessment Details Patient Name: Date of Service: [name], [name] HA M 05/12/2020 8:45 A M Medical Record Number: [id number] Patient Account Number: [id number] Date of Birth/Sex: Treating RN: 03-12-1946 (74 y.o. [name] Primary Care Jahlani Lorentz: [name] Other Clinician: Referring Annarose Ouellet: Treating Nyesha Cliff/Extender: [name] in Treatment: 1 Wound Status Wound Number: 11 Primary Calcinosis Etiology: Wound Location: Hand - 2nd Digit Wound Open Wounding Event: Gradually Appeared Status: Date Acquired: 04/20/2020 Comorbid Cataracts, Anemia, Arrhythmia, Congestive Heart Failure, Weeks Of Treatment: 1 History: Hypertension, Type II Diabetes, Gout, Neuropathy Clustered Wound: No Wound Measurements Length: (cm) 2.5 Width: (cm) 2.5 Depth: (cm) 0.2 Area: (cm) 4.909 Volume: (cm) 0.982 % Reduction in Area: -8.5% % Reduction in Volume: -8.5% Epithelialization: Small (1-33%) Wound Description Classification: Full Thickness Without Exposed Support Structures Wound Margin: Flat and Intact Exudate Amount: Small Exudate Type: Serous Exudate Color: amber Foul Odor After Cleansing: No Slough/Fibrino Yes Wound Bed Granulation Amount: None Present (  0%) Exposed Structure Necrotic Amount: Large (67-100%) Fascia Exposed: No Necrotic Quality: Eschar, Adherent Slough Fat Layer (Subcutaneous Tissue) Exposed: Yes Tendon Exposed: No Muscle Exposed: No Joint Exposed: No Bone Exposed: No Treatment Notes Wound  #11 (Hand - 2nd Digit) 3. Primary Dressing Applied Calcium Alginate Ag 4. Secondary Dressing Roll Gauze Notes netting Electronic Signature(s) Signed: 05/12/2020 4:06:40 PM By: Kela Millin Signed: 05/12/2020 4:21:43 PM By: [name] RN, BSN Entered By: Kela Millin on 05/12/2020 09:14:36 -------------------------------------------------------------------------------- [place] Details Patient Name: Date of Service: [name], [name] HA M 05/12/2020 8:45 A M Medical Record Number: [id number] Patient Account Number: [id number] Date of Birth/Sex: Treating RN: [birth year] (74 y.o. [name] Primary Care Eh Sauseda: [name] Other Clinician: Referring Rocquel Askren: Treating Abie Cheek/Extender: [name] in Treatment: 1 Vital Signs Time Taken: 09:05 Temperature (F): 98.0 Height (in): 68 Pulse (bpm): 45 Weight (lbs): 185 Respiratory Rate (breaths/min): 18 Body Mass Index (BMI): 28.1 Blood Pressure (mmHg): 120/66 Capillary Blood Glucose (mg/dl): 70 Reference Range: 80 - 120 mg / dl Electronic Signature(s) Signed: 05/12/2020 2:53:33 PM By: [name] Entered By: [name] on 05/12/2020 09:08:15

## 2020-05-15 ENCOUNTER — Other Ambulatory Visit: Payer: Self-pay

## 2020-05-15 ENCOUNTER — Encounter (HOSPITAL_BASED_OUTPATIENT_CLINIC_OR_DEPARTMENT_OTHER): Payer: Self-pay | Admitting: Orthopedic Surgery

## 2020-05-16 NOTE — Telephone Encounter (Signed)
Ysmael Hires 74 year old male is requesting right index finger debridement versus amputation.  May his aspirin be held for the procedure?  He was last seen in the clinic on 01/29/2020.  During that time he was recovering from a fall and wearing an orthopedic boot.  He denied chest discomfort and swelling.  He remains on chronic steroid therapy for his transplant.  Is doing well with his renal failure.  He has received his first Covid 19 vaccination.  His PMH includes anemia, atrial fibrillation (on aspirin), CKD, diabetes mellitus, dysrhythmia, hyperlipidemia, neuropathy, and left leg wound.  Please direct response to [facility] preop pool.  Thank you for your help.  Jossie Ng. Santhiago Collingsworth NP-C    05/16/2020, 9:30 AM [place] [address] Office [phone] Fax [phone]

## 2020-05-16 NOTE — Telephone Encounter (Signed)
Okay to hls aspirin.

## 2020-05-16 NOTE — Telephone Encounter (Signed)
   Primary Cardiologist: Dr.Smith   Chart reviewed as part of pre-operative protocol coverage. Given past medical history and time since last visit, based on ACC/AHA guidelines, Patty Lopezgarcia would be at acceptable risk for the planned procedure without further cardiovascular testing.   He may hold his Aspirin prior to procedure.   I will route this recommendation to the requesting party via Epic fax function and remove from pre-op pool.  Please call with questions.  Phill Myron. Osa Fogarty DNP, ANP, AACC  05/16/2020, 1:42 PM

## 2020-05-19 ENCOUNTER — Encounter (HOSPITAL_BASED_OUTPATIENT_CLINIC_OR_DEPARTMENT_OTHER): Payer: Medicare Other | Admitting: Internal Medicine

## 2020-05-19 ENCOUNTER — Encounter (HOSPITAL_BASED_OUTPATIENT_CLINIC_OR_DEPARTMENT_OTHER)
Admission: RE | Admit: 2020-05-19 | Discharge: 2020-05-19 | Disposition: A | Discharge status: Home / Self Care | Payer: Medicare Other | Source: Ambulatory Visit | Attending: Orthopedic Surgery | Admitting: Orthopedic Surgery

## 2020-05-19 ENCOUNTER — Other Ambulatory Visit (HOSPITAL_COMMUNITY)
Admission: RE | Admit: 2020-05-19 | Discharge: 2020-05-19 | Disposition: A | Discharge status: Home / Self Care | Payer: Medicare Other | Source: Ambulatory Visit | Attending: Orthopedic Surgery | Admitting: Orthopedic Surgery

## 2020-05-19 DIAGNOSIS — Z20822 Contact with and (suspected) exposure to covid-19: Secondary | ICD-10-CM | POA: Insufficient documentation

## 2020-05-19 DIAGNOSIS — Z01812 Encounter for preprocedural laboratory examination: Secondary | ICD-10-CM | POA: Insufficient documentation

## 2020-05-19 LAB — BASIC METABOLIC PANEL
Anion gap: 11 (ref 5–15)
BUN: 83 mg/dL — ABNORMAL HIGH (ref 8–23)
CO2: 14 mmol/L — ABNORMAL LOW (ref 22–32)
Calcium: 9.4 mg/dL (ref 8.9–10.3)
Chloride: 118 mmol/L — ABNORMAL HIGH (ref 98–111)
Creatinine, Ser: 3.33 mg/dL — ABNORMAL HIGH (ref 0.61–1.24)
GFR calc Af Amer: 20 mL/min — ABNORMAL LOW (ref >60)
GFR calc non Af Amer: 17 mL/min — ABNORMAL LOW (ref >60)
Glucose, Bld: 66 mg/dL — ABNORMAL LOW (ref 70–99)
Potassium: 5.2 mmol/L — ABNORMAL HIGH (ref 3.5–5.1)
Sodium: 143 mmol/L (ref 135–145)

## 2020-05-19 LAB — CBC
HCT: 30.5 % — ABNORMAL LOW (ref 39.0–52.0)
Hemoglobin: 8.5 g/dL — ABNORMAL LOW (ref 13.0–17.0)
MCH: 27.5 pg (ref 26.0–34.0)
MCHC: 27.9 g/dL — ABNORMAL LOW (ref 30.0–36.0)
MCV: 98.7 fL (ref 80.0–100.0)
Platelets: 89 10*3/uL — ABNORMAL LOW (ref 150–400)
RBC: 3.09 MIL/uL — ABNORMAL LOW (ref 4.22–5.81)
RDW: 15.1 % (ref 11.5–15.5)
WBC: 6 10*3/uL (ref 4.0–10.5)
nRBC: 0 % (ref 0.0–0.2)

## 2020-05-19 LAB — SARS CORONAVIRUS 2 (TAT 6-24 HRS): SARS Coronavirus 2: NEGATIVE

## 2020-05-19 NOTE — Progress Notes (Signed)

## 2020-05-19 NOTE — Progress Notes (Signed)
Labs reviewed by Dr. Sabra Heck, will proceed with surgery as scheduled.

## 2020-05-22 ENCOUNTER — Encounter (HOSPITAL_BASED_OUTPATIENT_CLINIC_OR_DEPARTMENT_OTHER)
Admission: RE | Disposition: A | Discharge status: Home / Self Care | Payer: Self-pay | Source: Home / Self Care | Attending: Orthopedic Surgery

## 2020-05-22 ENCOUNTER — Encounter (HOSPITAL_BASED_OUTPATIENT_CLINIC_OR_DEPARTMENT_OTHER): Payer: Self-pay | Admitting: Orthopedic Surgery

## 2020-05-22 ENCOUNTER — Ambulatory Visit (HOSPITAL_BASED_OUTPATIENT_CLINIC_OR_DEPARTMENT_OTHER): Payer: Medicare Other | Admitting: Certified Registered"

## 2020-05-22 ENCOUNTER — Ambulatory Visit (HOSPITAL_BASED_OUTPATIENT_CLINIC_OR_DEPARTMENT_OTHER)
Admission: RE | Admit: 2020-05-22 | Discharge: 2020-05-22 | Disposition: A | Discharge status: Home / Self Care | Payer: Medicare Other | Attending: Orthopedic Surgery | Admitting: Orthopedic Surgery

## 2020-05-22 ENCOUNTER — Other Ambulatory Visit: Payer: Self-pay

## 2020-05-22 DIAGNOSIS — Z940 Kidney transplant status: Secondary | ICD-10-CM | POA: Insufficient documentation

## 2020-05-22 DIAGNOSIS — Z7952 Long term (current) use of systemic steroids: Secondary | ICD-10-CM | POA: Insufficient documentation

## 2020-05-22 DIAGNOSIS — I130 Hypertensive heart and chronic kidney disease with heart failure and stage 1 through stage 4 chronic kidney disease, or unspecified chronic kidney disease: Secondary | ICD-10-CM | POA: Diagnosis not present

## 2020-05-22 DIAGNOSIS — S61200A Unspecified open wound of right index finger without damage to nail, initial encounter: Secondary | ICD-10-CM | POA: Diagnosis present

## 2020-05-22 DIAGNOSIS — E785 Hyperlipidemia, unspecified: Secondary | ICD-10-CM | POA: Diagnosis not present

## 2020-05-22 DIAGNOSIS — Z794 Long term (current) use of insulin: Secondary | ICD-10-CM | POA: Insufficient documentation

## 2020-05-22 DIAGNOSIS — I4891 Unspecified atrial fibrillation: Secondary | ICD-10-CM | POA: Diagnosis not present

## 2020-05-22 DIAGNOSIS — Z79899 Other long term (current) drug therapy: Secondary | ICD-10-CM | POA: Insufficient documentation

## 2020-05-22 DIAGNOSIS — Z8249 Family history of ischemic heart disease and other diseases of the circulatory system: Secondary | ICD-10-CM | POA: Diagnosis not present

## 2020-05-22 DIAGNOSIS — E1152 Type 2 diabetes mellitus with diabetic peripheral angiopathy with gangrene: Secondary | ICD-10-CM | POA: Insufficient documentation

## 2020-05-22 DIAGNOSIS — I499 Cardiac arrhythmia, unspecified: Secondary | ICD-10-CM | POA: Diagnosis not present

## 2020-05-22 DIAGNOSIS — Z7982 Long term (current) use of aspirin: Secondary | ICD-10-CM | POA: Insufficient documentation

## 2020-05-22 DIAGNOSIS — M1A9XX1 Chronic gout, unspecified, with tophus (tophi): Secondary | ICD-10-CM | POA: Insufficient documentation

## 2020-05-22 DIAGNOSIS — Y939 Activity, unspecified: Secondary | ICD-10-CM | POA: Diagnosis not present

## 2020-05-22 DIAGNOSIS — E1122 Type 2 diabetes mellitus with diabetic chronic kidney disease: Secondary | ICD-10-CM | POA: Insufficient documentation

## 2020-05-22 DIAGNOSIS — X58XXXA Exposure to other specified factors, initial encounter: Secondary | ICD-10-CM | POA: Diagnosis not present

## 2020-05-22 DIAGNOSIS — N189 Chronic kidney disease, unspecified: Secondary | ICD-10-CM | POA: Insufficient documentation

## 2020-05-22 DIAGNOSIS — I509 Heart failure, unspecified: Secondary | ICD-10-CM | POA: Insufficient documentation

## 2020-05-22 DIAGNOSIS — E1142 Type 2 diabetes mellitus with diabetic polyneuropathy: Secondary | ICD-10-CM | POA: Diagnosis not present

## 2020-05-22 DIAGNOSIS — Z823 Family history of stroke: Secondary | ICD-10-CM | POA: Insufficient documentation

## 2020-05-22 DIAGNOSIS — I96 Gangrene, not elsewhere classified: Secondary | ICD-10-CM | POA: Insufficient documentation

## 2020-05-22 HISTORY — DX: Essential (primary) hypertension: I10

## 2020-05-22 HISTORY — DX: Heart failure, unspecified: I50.9

## 2020-05-22 HISTORY — PX: AMPUTATION: SHX166

## 2020-05-22 LAB — GLUCOSE, CAPILLARY
Glucose-Capillary: 188 mg/dL — ABNORMAL HIGH (ref 70–99)
Glucose-Capillary: 211 mg/dL — ABNORMAL HIGH (ref 70–99)

## 2020-05-22 SURGERY — AMPUTATION DIGIT
Anesthesia: General | Site: Index Finger | Laterality: Right

## 2020-05-22 MED ORDER — PROPOFOL 10 MG/ML IV BOLUS
INTRAVENOUS | Status: DC | PRN
  Administered 2020-05-22: 150 mg via INTRAVENOUS
  Administered 2020-05-22: 50 mg via INTRAVENOUS

## 2020-05-22 MED ORDER — ONDANSETRON HCL 4 MG/2ML IJ SOLN
INTRAMUSCULAR | Status: DC | PRN
  Administered 2020-05-22: 4 mg via INTRAVENOUS

## 2020-05-22 MED ORDER — HYDRALAZINE HCL 20 MG/ML IJ SOLN
INTRAMUSCULAR | Status: AC
  Filled 2020-05-22: qty 1

## 2020-05-22 MED ORDER — LACTATED RINGERS IV SOLN: INTRAVENOUS | Status: DC

## 2020-05-22 MED ORDER — HYDROCODONE-ACETAMINOPHEN 5-325 MG PO TABS
ORAL_TABLET | ORAL | 0 refills | Status: DC
Start: 2020-05-22 — End: 2020-05-26

## 2020-05-22 MED ORDER — AMISULPRIDE (ANTIEMETIC) 5 MG/2ML IV SOLN: 10.0000 mg | Freq: Once | INTRAVENOUS | Status: DC | PRN

## 2020-05-22 MED ORDER — FENTANYL CITRATE (PF) 100 MCG/2ML IJ SOLN
INTRAMUSCULAR | Status: DC | PRN
  Administered 2020-05-22 (×3): 25 ug via INTRAVENOUS

## 2020-05-22 MED ORDER — ONDANSETRON HCL 4 MG/2ML IJ SOLN
INTRAMUSCULAR | Status: AC
  Filled 2020-05-22: qty 2

## 2020-05-22 MED ORDER — OXYCODONE HCL 5 MG/5ML PO SOLN: 5.0000 mg | Freq: Once | ORAL | Status: DC | PRN

## 2020-05-22 MED ORDER — PROPOFOL 500 MG/50ML IV EMUL
INTRAVENOUS | Status: AC
  Filled 2020-05-22: qty 50

## 2020-05-22 MED ORDER — OXYCODONE HCL 5 MG PO TABS: 5.0000 mg | ORAL_TABLET | Freq: Once | ORAL | Status: DC | PRN

## 2020-05-22 MED ORDER — CEFAZOLIN SODIUM-DEXTROSE 2-4 GM/100ML-% IV SOLN
INTRAVENOUS | Status: AC
  Filled 2020-05-22: qty 100

## 2020-05-22 MED ORDER — DEXAMETHASONE SODIUM PHOSPHATE 10 MG/ML IJ SOLN
INTRAMUSCULAR | Status: AC
  Filled 2020-05-22: qty 1

## 2020-05-22 MED ORDER — 0.9 % SODIUM CHLORIDE (POUR BTL) OPTIME
TOPICAL | Status: DC | PRN
  Administered 2020-05-22: 1000 mL

## 2020-05-22 MED ORDER — BUPIVACAINE HCL (PF) 0.25 % IJ SOLN
INTRAMUSCULAR | Status: DC | PRN
  Administered 2020-05-22: 10 mL

## 2020-05-22 MED ORDER — PROMETHAZINE HCL 25 MG/ML IJ SOLN: 6.2500 mg | INTRAMUSCULAR | Status: DC | PRN

## 2020-05-22 MED ORDER — BUPIVACAINE HCL (PF) 0.25 % IJ SOLN
INTRAMUSCULAR | Status: AC
  Filled 2020-05-22: qty 30

## 2020-05-22 MED ORDER — HYDROMORPHONE HCL 1 MG/ML IJ SOLN: 0.2500 mg | INTRAMUSCULAR | Status: DC | PRN

## 2020-05-22 MED ORDER — HYDRALAZINE HCL 20 MG/ML IJ SOLN
10.0000 mg | Freq: Once | INTRAMUSCULAR | Status: AC
  Administered 2020-05-22: 10 mg via INTRAVENOUS

## 2020-05-22 MED ORDER — CEFAZOLIN SODIUM-DEXTROSE 2-4 GM/100ML-% IV SOLN
2.0000 g | INTRAVENOUS | Status: AC
  Administered 2020-05-22: 2 g via INTRAVENOUS

## 2020-05-22 MED ORDER — LIDOCAINE 2% (20 MG/ML) 5 ML SYRINGE
INTRAMUSCULAR | Status: DC | PRN
  Administered 2020-05-22: 60 mg via INTRAVENOUS

## 2020-05-22 MED ORDER — FENTANYL CITRATE (PF) 100 MCG/2ML IJ SOLN
INTRAMUSCULAR | Status: AC
  Filled 2020-05-22: qty 2

## 2020-05-22 SURGICAL SUPPLY — 50 items
APL PRP STRL LF DISP 70% ISPRP (MISCELLANEOUS)
BLADE MINI RND TIP GREEN BEAV (BLADE) IMPLANT
BLADE SURG 15 STRL LF DISP TIS (BLADE) ×2 IMPLANT
BLADE SURG 15 STRL SS (BLADE) ×6
BNDG CMPR 9X4 STRL LF SNTH (GAUZE/BANDAGES/DRESSINGS)
BNDG COHESIVE 1X5 TAN STRL LF (GAUZE/BANDAGES/DRESSINGS) IMPLANT
BNDG ELASTIC 2X5.8 VLCR STR LF (GAUZE/BANDAGES/DRESSINGS) IMPLANT
BNDG ELASTIC 3X5.8 VLCR STR LF (GAUZE/BANDAGES/DRESSINGS) IMPLANT
BNDG ESMARK 4X9 LF (GAUZE/BANDAGES/DRESSINGS) IMPLANT
BNDG GAUZE 1X2.1 STRL (MISCELLANEOUS) IMPLANT
CHLORAPREP W/TINT 26 (MISCELLANEOUS) IMPLANT
CORD BIPOLAR FORCEPS 12FT (ELECTRODE) ×3 IMPLANT
COVER BACK TABLE 60X90IN (DRAPES) ×3 IMPLANT
COVER MAYO STAND STRL (DRAPES) ×3 IMPLANT
COVER WAND RF STERILE (DRAPES) IMPLANT
DECANTER SPIKE VIAL GLASS SM (MISCELLANEOUS) IMPLANT
DRAIN PENROSE 1/4X12 LTX STRL (WOUND CARE) IMPLANT
DRAPE EXTREMITY T 121X128X90 (DISPOSABLE) ×3 IMPLANT
DRAPE OEC MINIVIEW 54X84 (DRAPES) IMPLANT
DRAPE SURG 17X23 STRL (DRAPES) ×3 IMPLANT
GAUZE SPONGE 4X4 12PLY STRL (GAUZE/BANDAGES/DRESSINGS) ×3 IMPLANT
GAUZE SPONGE 4X4 12PLY STRL LF (GAUZE/BANDAGES/DRESSINGS) IMPLANT
GAUZE XEROFORM 1X8 LF (GAUZE/BANDAGES/DRESSINGS) ×3 IMPLANT
GLOVE BIO SURGEON STRL SZ7.5 (GLOVE) ×3 IMPLANT
GLOVE BIOGEL PI IND STRL 8 (GLOVE) ×1 IMPLANT
GLOVE BIOGEL PI INDICATOR 8 (GLOVE) ×2
GOWN STRL REUS W/ TWL LRG LVL3 (GOWN DISPOSABLE) ×1 IMPLANT
GOWN STRL REUS W/TWL LRG LVL3 (GOWN DISPOSABLE) ×3
GOWN STRL REUS W/TWL XL LVL3 (GOWN DISPOSABLE) ×3 IMPLANT
NDL HYPO 25X1 1.5 SAFETY (NEEDLE) IMPLANT
NEEDLE HYPO 25X1 1.5 SAFETY (NEEDLE) IMPLANT
NS IRRIG 1000ML POUR BTL (IV SOLUTION) ×3 IMPLANT
PACK BASIN DAY SURGERY FS (CUSTOM PROCEDURE TRAY) ×3 IMPLANT
PADDING CAST ABS 4INX4YD NS (CAST SUPPLIES) ×2
PADDING CAST ABS COTTON 4X4 ST (CAST SUPPLIES) ×1 IMPLANT
STOCKINETTE 4X48 STRL (DRAPES) ×3 IMPLANT
SUT CHROMIC 4 0 P 3 18 (SUTURE) IMPLANT
SUT ETHILON 3 0 PS 1 (SUTURE) IMPLANT
SUT ETHILON 4 0 PS 2 18 (SUTURE) IMPLANT
SUT MERSILENE 4 0 P 3 (SUTURE) IMPLANT
SUT MON AB 5-0 P3 18 (SUTURE) ×2 IMPLANT
SUT VIC AB 4-0 P-3 18XBRD (SUTURE) IMPLANT
SUT VIC AB 4-0 P3 18 (SUTURE)
SWAB COLLECTION DEVICE MRSA (MISCELLANEOUS) ×2 IMPLANT
SWAB CULTURE ESWAB REG 1ML (MISCELLANEOUS) ×2 IMPLANT
SYR BULB EAR ULCER 3OZ GRN STR (SYRINGE) ×3 IMPLANT
SYR CONTROL 10ML LL (SYRINGE) IMPLANT
TOWEL GREEN STERILE FF (TOWEL DISPOSABLE) ×3 IMPLANT
TRAY DSU PREP LF (CUSTOM PROCEDURE TRAY) ×3 IMPLANT
UNDERPAD 30X36 HEAVY ABSORB (UNDERPADS AND DIAPERS) ×3 IMPLANT

## 2020-05-22 NOTE — Anesthesia Procedure Notes (Signed)
Procedure Name: LMA Insertion Date/Time: 05/22/2020 9:04 AM Performed by: Maryella Shivers, CRNA Pre-anesthesia Checklist: Patient identified, Emergency Drugs available, Suction available and Patient being monitored Patient Re-evaluated:Patient Re-evaluated prior to induction Oxygen Delivery Method: Circle system utilized Preoxygenation: Pre-oxygenation with 100% oxygen Induction Type: IV induction Ventilation: Mask ventilation without difficulty LMA: LMA inserted LMA Size: 4.0 Number of attempts: 1 Airway Equipment and Method: Bite block Placement Confirmation: positive ETCO2 Tube secured with: Tape Dental Injury: Teeth and Oropharynx as per pre-operative assessment

## 2020-05-22 NOTE — Discharge Instructions (Signed)
Center Instructions Hand Surgery  Wound Care: Keep your hand elevated above the level of your heart.  Do not allow it to dangle by your side.  Keep the dressing dry and do not remove it unless your doctor advises you to do so.  He will usually change it at the time of your post-op visit.  Moving your fingers is advised to stimulate circulation but will depend on the site of your surgery.  If you have a splint applied, your doctor will advise you regarding movement.  Activity: Do not drive or operate machinery today.  Rest today and then you may return to your normal activity and work as indicated by your physician.  Diet:  Drink liquids today or eat a light diet.  You may resume a regular diet tomorrow.    General expectations: Pain for two to three days. Fingers may become slightly swollen.  Call your doctor if any of the following occur: Severe pain not relieved by pain medication. Elevated temperature. Dressing soaked with blood. Inability to move fingers. White or bluish color to fingers.    Post Anesthesia Home Care Instructions  Activity: Get plenty of rest for the remainder of the day. A responsible individual must stay with you for 24 hours following the procedure.  For the next 24 hours, DO NOT: -Drive a car -Paediatric nurse -Drink alcoholic beverages -Take any medication unless instructed by your physician -Make any legal decisions or sign important papers.  Meals: Start with liquid foods such as gelatin or soup. Progress to regular foods as tolerated. Avoid greasy, spicy, heavy foods. If nausea and/or vomiting occur, drink only clear liquids until the nausea and/or vomiting subsides. Call your physician if vomiting continues.  Special Instructions/Symptoms: Your throat may feel dry or sore from the anesthesia or the breathing tube placed in your throat during surgery. If this causes discomfort, gargle with warm salt water. The discomfort should disappear within 24  hours.  If you had a scopolamine patch placed behind your ear for the management of post- operative nausea and/or vomiting:  1. The medication in the patch is effective for 72 hours, after which it should be removed.  Wrap patch in a tissue and discard in the trash. Wash hands thoroughly with soap and water. 2. You may remove the patch earlier than 72 hours if you experience unpleasant side effects which may include dry mouth, dizziness or visual disturbances. 3. Avoid touching the patch. Wash your hands with soap and water after contact with the patch.    Call your surgeon if you experience:   1.  Fever over 101.0. 2.  Inability to urinate. 3.  Nausea and/or vomiting. 4.  Extreme swelling or bruising at the surgical site. 5.  Continued bleeding from the incision. 6.  Increased pain, redness or drainage from the incision. 7.  Problems related to your pain medication. 8.  Any problems and/or concerns

## 2020-05-22 NOTE — Op Note (Signed)
NAME: [name] MEDICAL RECORD NO: [id number] DATE OF BIRTH: Oct 05, 1945 FACILITY: [name] LOCATION: [facility] PHYSICIAN: Tennis Must, MD   OPERATIVE REPORT   DATE OF PROCEDURE: 05/22/20    PREOPERATIVE DIAGNOSIS:   Right index finger nonhealing wound and necrosis   POSTOPERATIVE DIAGNOSIS:   Right index finger nonhealing wound and necrosis   PROCEDURE:   Amputation right index finger through distal aspect middle phalanx   SURGEON:  Leanora Cover, M.D.   ASSISTANT: none   ANESTHESIA:  General   INTRAVENOUS FLUIDS:  Per anesthesia flow sheet.   ESTIMATED BLOOD LOSS:  Minimal.   COMPLICATIONS:  None.   SPECIMENS:   Cultures to micro.  Finger to pathology for gross exam   TOURNIQUET TIME:   Esmarch bandage at wrist for 38 minutes   DISPOSITION:  Stable to PACU.   INDICATIONS: 74 year old male with nonhealing wound right index finger.  He wishes to proceed with debridement versus amputation right index finger. Risks, benefits and alternatives of surgery were discussed including the risks of blood loss, infection, damage to nerves, vessels, tendons, ligaments, bone for surgery, need for additional surgery, complications with wound healing, continued pain, stiffness.  He voiced understanding of these risks and elected to proceed.  OPERATIVE COURSE:  After being identified preoperatively by myself,  the patient and I agreed on the procedure and site of the procedure.  The surgical site was marked.  Surgical consent had been signed. He was given IV antibiotics as preoperative antibiotic prophylaxis. He was transferred to the operating room and placed on the operating table in supine position with the Right upper extremity on an arm board.  General anesthesia was induced by the anesthesiologist.  Right upper extremity was prepped and draped in normal sterile orthopedic fashion.  A surgical pause was performed between the surgeons, anesthesia, and operating room  staff and all were in agreement as to the patient, procedure, and site of procedure.  An Esmarch bandage was used as a tourniquet at the wrist.   The wound was examined.  The dried skin over top was ill back.  There is a wound of approximately half of the distal phalanx at the radial side with skin loss.  There was white gouty appearing material within the wound.  There is necrosis at the dorsal aspect near the nail.  It was felt that this would not be a healed the wound with debridement.  Amputation was then performed.  A fishmouth type incision was made and carried into subcutaneous tissues by spreading technique.  Bipolar trocar was used to ligate the digital nerve and artery on both the radial and ulnar sides and allow them to retract.  The finger was amputated through the DIP joint initially.  The distal aspect of the finger was sent to pathology for gross exam.  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  The bone cutters were then used to amputate through the distal aspect of the middle phalanx.  Rongeurs were used to round off the end of the bone.  The wound was copiously irrigated with sterile saline.  The skin edges were reapproximated with 5-0 Monocryl suture in an interrupted fashion.  Tension-free reapposition was obtained.  There was still some white gouty type material in the subcutaneous tissues but not large clumps that could be debrided.  The wound was dressed with sterile Xeroform 4 x 4 and wrapped with a Coban dressing lightly.  A digital block was performed with quarter percent plain  Marcaine to aid in postoperative analgesia.  The Esmarch tourniquet was removed at 38 minutes.  Fingertips were pink with brisk capillary refill after deflation of tourniquet.  The operative  drapes were broken down.  The patient was awoken from anesthesia safely.  He was transferred back to the stretcher and taken to PACU in stable condition.  I will see him back in the office in 1 week for postoperative  followup.  I will give him a prescription for Norco 5/325 1-2 tabs PO q6 hours prn pain, dispense # 20.   Leanora Cover, MD Electronically signed, 05/22/20

## 2020-05-22 NOTE — H&P (Signed)
[name] is an 74 y.o. male.   Chief Complaint: right index finger necrosis HPI: 74 yo male with non healing wound and necrosis right index finger.  He wishes to proceed with debridement vs amputation right index finger.  Allergies:  Allergies  Allergen Reactions  . Elavil [Amitriptyline Hcl] Other (See Comments)    Unknown felt as if he was tripping    Past Medical History:  Diagnosis Date  . Anemia   . Atrial fibrillation ([place])   . CHF (congestive heart failure) ([place])   . Chronic kidney disease   . Diabetes mellitus   . Dysrhythmia   . Hyperlipidemia   . Hypertension   . Neuropathy   . Wound of left leg 08/2018    Past Surgical History:  Procedure Laterality Date  . ABDOMINAL AORTOGRAM W/LOWER EXTREMITY N/A 10/12/2019   Procedure: ABDOMINAL AORTOGRAM W/LOWER EXTREMITY;  Surgeon: Angelia Mould, MD;  Location: [facility];  Service: Cardiovascular;  Laterality: N/A;  With CO2  . access proceds    . achlles tendon repair    . ARTERIOVENOUS GRAFT PLACEMENT    . bowel repla     repair  . ENDOVENOUS ABLATION SAPHENOUS VEIN W/ LASER Right 10/04/2019   endovenous laser ablation right greater saphenous vein by Gae Gallop MD   . [place] and 2005  . LIGATION GORETEX GRAFT  06/30/11   Right upper arm    Family History: Family History  Problem Relation Age of Onset  . Heart disease Mother   . Heart attack Father   . Stroke Sister     Social History:   reports that he has never smoked. He has never used smokeless tobacco. He reports that he does not drink alcohol and does not use drugs.  Medications: Medications Prior to Admission  Medication Sig Dispense Refill  . acetaminophen (TYLENOL) 500 MG tablet Take 1,000 mg by mouth every 8 (eight) hours as needed for moderate pain.     Marland Kitchen aspirin 81 MG tablet Take 81 mg by mouth daily.      Marland Kitchen atorvastatin (LIPITOR) 10 MG tablet Take 10 mg by mouth daily.    . calcitRIOL (ROCALTROL)  0.25 MCG capsule Take 0.25 mcg by mouth daily. daily    . carvedilol (COREG) 6.25 MG tablet Take 1 tablet (6.25 mg total) by mouth 2 (two) times daily with a meal. 180 tablet 3  . cephALEXin (KEFLEX) 500 MG capsule Take 500 mg by mouth 2 (two) times daily.    . diphenhydrAMINE (BENADRYL) 25 mg capsule Take 1 capsule (25 mg total) by mouth at bedtime as needed (for sleep/allergies.). 30 capsule 0  . furosemide (LASIX) 40 MG tablet Take 80 mg by mouth daily. Fluid. 30 tablet   . gabapentin (NEURONTIN) 100 MG capsule Take 100 mg by mouth 3 (three) times daily as needed (for nerve pain). Take three times/day as needed    . HUMALOG KWIKPEN 100 UNIT/ML KwikPen Inject 3-5 Units into the skin 3 (three) times daily.  6  . hydrALAZINE (APRESOLINE) 25 MG tablet Take 25 mg by mouth 3 (three) times daily.   4  . LANTUS SOLOSTAR 100 UNIT/ML Solostar Pen Inject 7-15 Units into the skin daily. If blood sugars are >300, use 15 units, <300 use 7 units    . linagliptin (TRADJENTA) 5 MG TABS tablet Take 5 mg by mouth daily.      Marland Kitchen losartan (COZAAR) 25 MG tablet Take 12.5 mg  by mouth daily.    . mycophenolate (CELLCEPT) 500 MG tablet Take 500 mg by mouth 2 (two) times daily.     . Omega-3 Fatty Acids (FISH OIL) 1000 MG CAPS Take by mouth.    . predniSONE (DELTASONE) 5 MG tablet Take 5 mg by mouth daily.  2  . tacrolimus (PROGRAF) 1 MG capsule Take 1 mg by mouth 2 (two) times daily.     . Alcohol Swabs (B-D SINGLE USE SWABS REGULAR) PADS Use as directed    . BAYER CONTOUR NEXT TEST test strip Use as directed    . BD PEN NEEDLE NANO U/F 32G X 4 MM MISC Use as directed    . latanoprost (XALATAN) 0.005 % ophthalmic solution Place 1 drop into both eyes at bedtime.    Marland Kitchen MICROLET LANCETS MISC Use as directed      Results for orders placed or performed during the hospital encounter of 05/22/20 (from the past 48 hour(s))  Glucose, capillary     Status: Abnormal   Collection Time: 05/22/20  7:38 AM  Result Value Ref Range    Glucose-Capillary 188 (H) 70 - 99 mg/dL    Comment: Glucose reference range applies only to samples taken after fasting for at least 8 hours.    No results found.   A comprehensive review of systems was negative.  Blood pressure (!) 186/89, pulse 70, temperature 98.4 F (36.9 C), temperature source Oral, resp. rate 16, height 5\' 9"  (1.753 m), weight 88.9 kg, SpO2 97 %.  General appearance: alert, cooperative and appears stated age Head: Normocephalic, without obvious abnormality, atraumatic Neck: supple, symmetrical, trachea midline Cardio: regular rate and rhythm Resp: clear to auscultation bilaterally Extremities: Intact sensation and capillary refill all digits.  +epl/fpl/io.  Right index finger with non healing wound dip joint and distal.  No erythema. Pulses: 2+ and symmetric Skin: Skin color, texture, turgor normal. No rashes or lesions Neurologic: Grossly normal Incision/Wound: as above  Assessment/Plan Right index finger non healing wound/necrosis.  Plan debridement vs amputation right index finger.  Non operative and operative treatment options have been discussed with the patient and patient wishes to proceed with operative treatment. Risks, benefits, and alternatives of surgery have been discussed and the patient agrees with the plan of care.   Leanora Cover 05/22/2020, 8:44 AM

## 2020-05-22 NOTE — Anesthesia Preprocedure Evaluation (Addendum)
Anesthesia Evaluation  Patient identified by MRN, date of birth, ID band Patient awake    Reviewed: Allergy & Precautions, NPO status , Patient's Chart, lab work & pertinent test results  Airway Mallampati: II  TM Distance: >3 FB Neck ROM: Full    Dental  (+) Poor Dentition, Missing, Dental Advisory Given   Pulmonary neg pulmonary ROS,    Pulmonary exam normal breath sounds clear to auscultation       Cardiovascular hypertension, Pt. on medications Normal cardiovascular exam Rhythm:Regular Rate:Normal     Neuro/Psych negative neurological ROS  negative psych ROS   GI/Hepatic negative GI ROS, Neg liver ROS,   Endo/Other  negative endocrine ROSdiabetes  Renal/GU Renal Insufficiency and CRFRenal disease  negative genitourinary   Musculoskeletal negative musculoskeletal ROS (+)   Abdominal   Peds negative pediatric ROS (+)  Hematology negative hematology ROS (+) anemia ,   Anesthesia Other Findings   Reproductive/Obstetrics negative OB ROS                            Anesthesia Physical Anesthesia Plan  ASA: III  Anesthesia Plan: General   Post-op Pain Management:    Induction: Intravenous  PONV Risk Score and Plan: 2 and Ondansetron, Midazolam and Treatment may vary due to age or medical condition  Airway Management Planned: LMA  Additional Equipment:   Intra-op Plan:   Post-operative Plan: Extubation in OR  Informed Consent: I have reviewed the patients History and Physical, chart, labs and discussed the procedure including the risks, benefits and alternatives for the proposed anesthesia with the patient or authorized representative who has indicated his/her understanding and acceptance.     Dental advisory given  Plan Discussed with: CRNA  Anesthesia Plan Comments:         Anesthesia Quick Evaluation

## 2020-05-22 NOTE — Transfer of Care (Signed)
Immediate Anesthesia Transfer of Care Note  Patient: [name]  Procedure(s) Performed: RIGHT INDEX FINGER DEBRIDEMENT AND AMPUTATION (Right Index Finger)  Patient Location: PACU  Anesthesia Type:General  Level of Consciousness: sedated  Airway & Oxygen Therapy: Patient Spontanous Breathing and Patient connected to face mask oxygen  Post-op Assessment: Report given to RN and Post -op Vital signs reviewed and stable  Post vital signs: Reviewed and stable  Last Vitals:  Vitals Value Taken Time  BP 133/60 05/22/20 1000  Temp    Pulse 68 05/22/20 1003  Resp 14 05/22/20 1003  SpO2 100 % 05/22/20 1003  Vitals shown include unvalidated device data.  Last Pain:  Vitals:   05/22/20 0725  TempSrc: Oral  PainSc: 6       Patients Stated Pain Goal: 5 (85/27/78 2423)  Complications: No complications documented.

## 2020-05-22 NOTE — Anesthesia Postprocedure Evaluation (Signed)
Anesthesia Post Note  Patient: Kellar Westberg  Procedure(s) Performed: RIGHT INDEX FINGER DEBRIDEMENT AND AMPUTATION (Right Index Finger)     Patient location during evaluation: PACU Anesthesia Type: General Level of consciousness: awake and alert Pain management: pain level controlled Vital Signs Assessment: post-procedure vital signs reviewed and stable Respiratory status: spontaneous breathing, nonlabored ventilation and respiratory function stable Cardiovascular status: blood pressure returned to baseline and stable Postop Assessment: no apparent nausea or vomiting Anesthetic complications: no   No complications documented.  Last Vitals:  Vitals:   05/22/20 1055 05/22/20 1112  BP:  (!) 187/117  Pulse: (!) 55 (!) 55  Resp: 12   Temp: 36.9 C   SpO2: 98%     Last Pain:  Vitals:   05/22/20 1055  TempSrc: Oral  PainSc: 0-No pain                 Lynda Rainwater

## 2020-05-23 ENCOUNTER — Encounter (HOSPITAL_BASED_OUTPATIENT_CLINIC_OR_DEPARTMENT_OTHER): Payer: Self-pay | Admitting: Orthopedic Surgery

## 2020-05-23 LAB — SURGICAL PATHOLOGY

## 2020-05-26 ENCOUNTER — Emergency Department (HOSPITAL_COMMUNITY): Payer: Medicare Other

## 2020-05-26 ENCOUNTER — Encounter (HOSPITAL_COMMUNITY): Payer: Self-pay | Admitting: Emergency Medicine

## 2020-05-26 ENCOUNTER — Inpatient Hospital Stay (HOSPITAL_COMMUNITY): Payer: Medicare Other

## 2020-05-26 ENCOUNTER — Inpatient Hospital Stay (HOSPITAL_COMMUNITY)
Admission: EM | Admit: 2020-05-26 | Discharge: 2020-06-06 | DRG: 871 | Disposition: A | Discharge status: Home Health | Payer: Medicare Other | Attending: Internal Medicine | Admitting: Internal Medicine

## 2020-05-26 ENCOUNTER — Other Ambulatory Visit: Payer: Self-pay

## 2020-05-26 DIAGNOSIS — Z6828 Body mass index (BMI) 28.0-28.9, adult: Secondary | ICD-10-CM

## 2020-05-26 DIAGNOSIS — E1151 Type 2 diabetes mellitus with diabetic peripheral angiopathy without gangrene: Secondary | ICD-10-CM | POA: Diagnosis present

## 2020-05-26 DIAGNOSIS — E1169 Type 2 diabetes mellitus with other specified complication: Secondary | ICD-10-CM | POA: Diagnosis not present

## 2020-05-26 DIAGNOSIS — Z20822 Contact with and (suspected) exposure to covid-19: Secondary | ICD-10-CM | POA: Diagnosis present

## 2020-05-26 DIAGNOSIS — S41112A Laceration without foreign body of left upper arm, initial encounter: Secondary | ICD-10-CM | POA: Diagnosis present

## 2020-05-26 DIAGNOSIS — I10 Essential (primary) hypertension: Secondary | ICD-10-CM | POA: Diagnosis not present

## 2020-05-26 DIAGNOSIS — E875 Hyperkalemia: Secondary | ICD-10-CM | POA: Diagnosis present

## 2020-05-26 DIAGNOSIS — E119 Type 2 diabetes mellitus without complications: Secondary | ICD-10-CM

## 2020-05-26 DIAGNOSIS — R778 Other specified abnormalities of plasma proteins: Secondary | ICD-10-CM | POA: Diagnosis present

## 2020-05-26 DIAGNOSIS — T796XXA Traumatic ischemia of muscle, initial encounter: Secondary | ICD-10-CM | POA: Diagnosis present

## 2020-05-26 DIAGNOSIS — R400 Somnolence: Secondary | ICD-10-CM | POA: Diagnosis not present

## 2020-05-26 DIAGNOSIS — I5031 Acute diastolic (congestive) heart failure: Secondary | ICD-10-CM | POA: Diagnosis not present

## 2020-05-26 DIAGNOSIS — E872 Acidosis: Secondary | ICD-10-CM | POA: Diagnosis not present

## 2020-05-26 DIAGNOSIS — T380X5A Adverse effect of glucocorticoids and synthetic analogues, initial encounter: Secondary | ICD-10-CM | POA: Diagnosis present

## 2020-05-26 DIAGNOSIS — E870 Hyperosmolality and hypernatremia: Secondary | ICD-10-CM | POA: Diagnosis present

## 2020-05-26 DIAGNOSIS — G9340 Encephalopathy, unspecified: Secondary | ICD-10-CM | POA: Diagnosis present

## 2020-05-26 DIAGNOSIS — I5033 Acute on chronic diastolic (congestive) heart failure: Secondary | ICD-10-CM | POA: Diagnosis present

## 2020-05-26 DIAGNOSIS — I130 Hypertensive heart and chronic kidney disease with heart failure and stage 1 through stage 4 chronic kidney disease, or unspecified chronic kidney disease: Secondary | ICD-10-CM | POA: Diagnosis present

## 2020-05-26 DIAGNOSIS — M109 Gout, unspecified: Secondary | ICD-10-CM | POA: Diagnosis present

## 2020-05-26 DIAGNOSIS — M549 Dorsalgia, unspecified: Secondary | ICD-10-CM | POA: Diagnosis present

## 2020-05-26 DIAGNOSIS — D631 Anemia in chronic kidney disease: Secondary | ICD-10-CM | POA: Diagnosis present

## 2020-05-26 DIAGNOSIS — E8729 Other acidosis: Secondary | ICD-10-CM

## 2020-05-26 DIAGNOSIS — R54 Age-related physical debility: Secondary | ICD-10-CM | POA: Diagnosis present

## 2020-05-26 DIAGNOSIS — N1832 Chronic kidney disease, stage 3b: Secondary | ICD-10-CM | POA: Diagnosis present

## 2020-05-26 DIAGNOSIS — R451 Restlessness and agitation: Secondary | ICD-10-CM | POA: Diagnosis present

## 2020-05-26 DIAGNOSIS — R4182 Altered mental status, unspecified: Secondary | ICD-10-CM | POA: Diagnosis not present

## 2020-05-26 DIAGNOSIS — E1140 Type 2 diabetes mellitus with diabetic neuropathy, unspecified: Secondary | ICD-10-CM | POA: Diagnosis present

## 2020-05-26 DIAGNOSIS — E669 Obesity, unspecified: Secondary | ICD-10-CM | POA: Diagnosis present

## 2020-05-26 DIAGNOSIS — Z452 Encounter for adjustment and management of vascular access device: Secondary | ICD-10-CM | POA: Diagnosis not present

## 2020-05-26 DIAGNOSIS — Z79899 Other long term (current) drug therapy: Secondary | ICD-10-CM

## 2020-05-26 DIAGNOSIS — Z8673 Personal history of transient ischemic attack (TIA), and cerebral infarction without residual deficits: Secondary | ICD-10-CM

## 2020-05-26 DIAGNOSIS — E860 Dehydration: Secondary | ICD-10-CM | POA: Diagnosis present

## 2020-05-26 DIAGNOSIS — I160 Hypertensive urgency: Secondary | ICD-10-CM | POA: Diagnosis present

## 2020-05-26 DIAGNOSIS — E785 Hyperlipidemia, unspecified: Secondary | ICD-10-CM | POA: Diagnosis not present

## 2020-05-26 DIAGNOSIS — R739 Hyperglycemia, unspecified: Secondary | ICD-10-CM | POA: Diagnosis not present

## 2020-05-26 DIAGNOSIS — Z8249 Family history of ischemic heart disease and other diseases of the circulatory system: Secondary | ICD-10-CM

## 2020-05-26 DIAGNOSIS — E1165 Type 2 diabetes mellitus with hyperglycemia: Secondary | ICD-10-CM | POA: Diagnosis present

## 2020-05-26 DIAGNOSIS — W19XXXA Unspecified fall, initial encounter: Secondary | ICD-10-CM | POA: Diagnosis present

## 2020-05-26 DIAGNOSIS — I480 Paroxysmal atrial fibrillation: Secondary | ICD-10-CM | POA: Diagnosis present

## 2020-05-26 DIAGNOSIS — Z940 Kidney transplant status: Secondary | ICD-10-CM

## 2020-05-26 DIAGNOSIS — Z7952 Long term (current) use of systemic steroids: Secondary | ICD-10-CM

## 2020-05-26 DIAGNOSIS — A419 Sepsis, unspecified organism: Principal | ICD-10-CM | POA: Diagnosis present

## 2020-05-26 DIAGNOSIS — S41111A Laceration without foreign body of right upper arm, initial encounter: Secondary | ICD-10-CM | POA: Diagnosis present

## 2020-05-26 DIAGNOSIS — Z89021 Acquired absence of right finger(s): Secondary | ICD-10-CM

## 2020-05-26 DIAGNOSIS — Z794 Long term (current) use of insulin: Secondary | ICD-10-CM

## 2020-05-26 DIAGNOSIS — R509 Fever, unspecified: Secondary | ICD-10-CM

## 2020-05-26 DIAGNOSIS — E874 Mixed disorder of acid-base balance: Secondary | ICD-10-CM | POA: Diagnosis present

## 2020-05-26 DIAGNOSIS — D849 Immunodeficiency, unspecified: Secondary | ICD-10-CM | POA: Diagnosis present

## 2020-05-26 DIAGNOSIS — N179 Acute kidney failure, unspecified: Secondary | ICD-10-CM | POA: Diagnosis present

## 2020-05-26 DIAGNOSIS — M6282 Rhabdomyolysis: Secondary | ICD-10-CM

## 2020-05-26 DIAGNOSIS — G9341 Metabolic encephalopathy: Secondary | ICD-10-CM

## 2020-05-26 DIAGNOSIS — Z7982 Long term (current) use of aspirin: Secondary | ICD-10-CM

## 2020-05-26 LAB — BETA-HYDROXYBUTYRIC ACID: Beta-Hydroxybutyric Acid: 4.35 mmol/L — ABNORMAL HIGH (ref 0.05–0.27)

## 2020-05-26 LAB — BASIC METABOLIC PANEL
Anion gap: 13 (ref 5–15)
BUN: 69 mg/dL — ABNORMAL HIGH (ref 8–23)
CO2: 13 mmol/L — ABNORMAL LOW (ref 22–32)
Calcium: 8.7 mg/dL — ABNORMAL LOW (ref 8.9–10.3)
Chloride: 122 mmol/L — ABNORMAL HIGH (ref 98–111)
Creatinine, Ser: 2.47 mg/dL — ABNORMAL HIGH (ref 0.61–1.24)
GFR calc Af Amer: 29 mL/min — ABNORMAL LOW (ref >60)
GFR calc non Af Amer: 25 mL/min — ABNORMAL LOW (ref >60)
Glucose, Bld: 279 mg/dL — ABNORMAL HIGH (ref 70–99)
Potassium: 4.7 mmol/L (ref 3.5–5.1)
Sodium: 148 mmol/L — ABNORMAL HIGH (ref 135–145)

## 2020-05-26 LAB — CBC
HCT: 34.2 % — ABNORMAL LOW (ref 39.0–52.0)
Hemoglobin: 9.8 g/dL — ABNORMAL LOW (ref 13.0–17.0)
MCH: 28.7 pg (ref 26.0–34.0)
MCHC: 28.7 g/dL — ABNORMAL LOW (ref 30.0–36.0)
MCV: 100.3 fL — ABNORMAL HIGH (ref 80.0–100.0)
Platelets: 111 10*3/uL — ABNORMAL LOW (ref 150–400)
RBC: 3.41 MIL/uL — ABNORMAL LOW (ref 4.22–5.81)
RDW: 14.9 % (ref 11.5–15.5)
WBC: 12 10*3/uL — ABNORMAL HIGH (ref 4.0–10.5)
nRBC: 0 % (ref 0.0–0.2)

## 2020-05-26 LAB — URINALYSIS, MICROSCOPIC (REFLEX)

## 2020-05-26 LAB — COMPREHENSIVE METABOLIC PANEL
ALT: 14 U/L (ref 0–44)
AST: 63 U/L — ABNORMAL HIGH (ref 15–41)
Albumin: 3.9 g/dL (ref 3.5–5.0)
Alkaline Phosphatase: 71 U/L (ref 38–126)
Anion gap: 15 (ref 5–15)
BUN: 74 mg/dL — ABNORMAL HIGH (ref 8–23)
CO2: 12 mmol/L — ABNORMAL LOW (ref 22–32)
Calcium: 9.7 mg/dL (ref 8.9–10.3)
Chloride: 122 mmol/L — ABNORMAL HIGH (ref 98–111)
Creatinine, Ser: 2.71 mg/dL — ABNORMAL HIGH (ref 0.61–1.24)
GFR calc Af Amer: 26 mL/min — ABNORMAL LOW (ref >60)
GFR calc non Af Amer: 22 mL/min — ABNORMAL LOW (ref >60)
Glucose, Bld: 152 mg/dL — ABNORMAL HIGH (ref 70–99)
Potassium: 5.4 mmol/L — ABNORMAL HIGH (ref 3.5–5.1)
Sodium: 149 mmol/L — ABNORMAL HIGH (ref 135–145)
Total Bilirubin: 1.4 mg/dL — ABNORMAL HIGH (ref 0.3–1.2)
Total Protein: 7 g/dL (ref 6.5–8.1)

## 2020-05-26 LAB — ECHOCARDIOGRAM COMPLETE
Area-P 1/2: 3.48 cm2
Calc EF: 54.7 %
Height: 69 in
S' Lateral: 3.3 cm
Single Plane A2C EF: 57.9 %
Single Plane A4C EF: 52.4 %
Weight: 3135.82 oz

## 2020-05-26 LAB — RAPID URINE DRUG SCREEN, HOSP PERFORMED
Amphetamines: NOT DETECTED
Barbiturates: NOT DETECTED
Benzodiazepines: NOT DETECTED
Cocaine: NOT DETECTED
Opiates: POSITIVE — AB
Tetrahydrocannabinol: NOT DETECTED

## 2020-05-26 LAB — PROTIME-INR
INR: 1.5 — ABNORMAL HIGH (ref 0.8–1.2)
Prothrombin Time: 17.1 seconds — ABNORMAL HIGH (ref 11.4–15.2)

## 2020-05-26 LAB — BLOOD GAS, ARTERIAL
Acid-base deficit: 15.9 mmol/L — ABNORMAL HIGH (ref 0.0–2.0)
Bicarbonate: 10.3 mmol/L — ABNORMAL LOW (ref 20.0–28.0)
FIO2: 21
O2 Saturation: 91 %
Patient temperature: 98.6
pCO2 arterial: 26.2 mmHg — ABNORMAL LOW (ref 32.0–48.0)
pH, Arterial: 7.218 — ABNORMAL LOW (ref 7.350–7.450)
pO2, Arterial: 68.1 mmHg — ABNORMAL LOW (ref 83.0–108.0)

## 2020-05-26 LAB — CK: Total CK: 1566 U/L — ABNORMAL HIGH (ref 49–397)

## 2020-05-26 LAB — URINALYSIS, ROUTINE W REFLEX MICROSCOPIC
Bilirubin Urine: NEGATIVE
Glucose, UA: NEGATIVE mg/dL
Ketones, ur: 40 mg/dL — AB
Leukocytes,Ua: NEGATIVE
Nitrite: NEGATIVE
Protein, ur: 100 mg/dL — AB
Specific Gravity, Urine: 1.02 (ref 1.005–1.030)
pH: 5.5 (ref 5.0–8.0)

## 2020-05-26 LAB — HEMOGLOBIN A1C
Hgb A1c MFr Bld: 6.6 % — ABNORMAL HIGH (ref 4.8–5.6)
Mean Plasma Glucose: 142.72 mg/dL

## 2020-05-26 LAB — BRAIN NATRIURETIC PEPTIDE: B Natriuretic Peptide: 809.3 pg/mL — ABNORMAL HIGH (ref 0.0–100.0)

## 2020-05-26 LAB — TSH: TSH: 0.773 u[IU]/mL (ref 0.350–4.500)

## 2020-05-26 LAB — SALICYLATE LEVEL: Salicylate Lvl: <7 mg/dL — ABNORMAL LOW (ref 7.0–30.0)

## 2020-05-26 LAB — CBG MONITORING, ED
Glucose-Capillary: 134 mg/dL — ABNORMAL HIGH (ref 70–99)
Glucose-Capillary: 167 mg/dL — ABNORMAL HIGH (ref 70–99)
Glucose-Capillary: 234 mg/dL — ABNORMAL HIGH (ref 70–99)
Glucose-Capillary: 222 mg/dL — ABNORMAL HIGH (ref 70–99)

## 2020-05-26 LAB — MAGNESIUM: Magnesium: 2.2 mg/dL (ref 1.7–2.4)

## 2020-05-26 LAB — SARS CORONAVIRUS 2 BY RT PCR (HOSPITAL ORDER, PERFORMED IN ~~LOC~~ HOSPITAL LAB): SARS Coronavirus 2: NEGATIVE

## 2020-05-26 LAB — ACETAMINOPHEN LEVEL: Acetaminophen (Tylenol), Serum: <10 ug/mL — ABNORMAL LOW (ref 10–30)

## 2020-05-26 LAB — ETHANOL: Alcohol, Ethyl (B): <10 mg/dL (ref <10)

## 2020-05-26 LAB — LIPASE, BLOOD: Lipase: 26 U/L (ref 11–51)

## 2020-05-26 LAB — TROPONIN I (HIGH SENSITIVITY)
Troponin I (High Sensitivity): 182 ng/L (ref <18)
Troponin I (High Sensitivity): 226 ng/L (ref <18)

## 2020-05-26 LAB — AMMONIA: Ammonia: 27 umol/L (ref 9–35)

## 2020-05-26 MED ORDER — TACROLIMUS 0.5 MG PO CAPS
0.5000 mg | ORAL_CAPSULE | Freq: Two times a day (BID) | ORAL | Status: DC
  Administered 2020-05-26 – 2020-05-29 (×6): 0.5 mg via SUBLINGUAL
  Filled 2020-05-26 (×8): qty 1

## 2020-05-26 MED ORDER — ZIPRASIDONE MESYLATE 20 MG IM SOLR
10.0000 mg | Freq: Once | INTRAMUSCULAR | Status: AC
  Administered 2020-05-26: 10 mg via INTRAMUSCULAR
  Filled 2020-05-26: qty 20

## 2020-05-26 MED ORDER — ALBUTEROL SULFATE (2.5 MG/3ML) 0.083% IN NEBU: 2.5000 mg | INHALATION_SOLUTION | RESPIRATORY_TRACT | Status: DC | PRN

## 2020-05-26 MED ORDER — LORAZEPAM 2 MG/ML IJ SOLN
0.5000 mg | INTRAMUSCULAR | Status: AC | PRN
  Administered 2020-05-26 (×3): 0.5 mg via INTRAVENOUS
  Filled 2020-05-26 (×3): qty 1

## 2020-05-26 MED ORDER — INSULIN ASPART 100 UNIT/ML ~~LOC~~ SOLN
0.0000 [IU] | Freq: Three times a day (TID) | SUBCUTANEOUS | Status: DC
  Administered 2020-05-26: 3 [IU] via SUBCUTANEOUS
  Administered 2020-05-26: 2 [IU] via SUBCUTANEOUS
  Administered 2020-05-27: 7 [IU] via SUBCUTANEOUS
  Administered 2020-05-27: 9 [IU] via SUBCUTANEOUS
  Filled 2020-05-26: qty 0.09

## 2020-05-26 MED ORDER — STERILE WATER FOR INJECTION IJ SOLN
INTRAMUSCULAR | Status: AC
  Filled 2020-05-26: qty 10

## 2020-05-26 MED ORDER — SODIUM CHLORIDE 0.9 % IV BOLUS
1000.0000 mL | Freq: Once | INTRAVENOUS | Status: AC
  Administered 2020-05-26: 1000 mL via INTRAVENOUS

## 2020-05-26 MED ORDER — ENOXAPARIN SODIUM 30 MG/0.3ML ~~LOC~~ SOLN: 30.0000 mg | SUBCUTANEOUS | Status: DC

## 2020-05-26 MED ORDER — METRONIDAZOLE IN NACL 5-0.79 MG/ML-% IV SOLN
500.0000 mg | Freq: Three times a day (TID) | INTRAVENOUS | Status: DC
  Administered 2020-05-26 – 2020-05-28 (×7): 500 mg via INTRAVENOUS
  Filled 2020-05-26 (×7): qty 100

## 2020-05-26 MED ORDER — VANCOMYCIN HCL IN DEXTROSE 1-5 GM/200ML-% IV SOLN: 1000.0000 mg | Freq: Once | INTRAVENOUS | Status: DC

## 2020-05-26 MED ORDER — SODIUM CHLORIDE 0.9% FLUSH
3.0000 mL | Freq: Two times a day (BID) | INTRAVENOUS | Status: DC
  Administered 2020-05-26 – 2020-05-31 (×9): 3 mL via INTRAVENOUS

## 2020-05-26 MED ORDER — SODIUM BICARBONATE 8.4 % IV SOLN
INTRAVENOUS | Status: DC
  Filled 2020-05-26 (×6): qty 50

## 2020-05-26 MED ORDER — HEPARIN SODIUM (PORCINE) 5000 UNIT/ML IJ SOLN
5000.0000 [IU] | Freq: Three times a day (TID) | INTRAMUSCULAR | Status: DC
  Administered 2020-05-26 – 2020-06-06 (×33): 5000 [IU] via SUBCUTANEOUS
  Filled 2020-05-26 (×33): qty 1

## 2020-05-26 MED ORDER — HYDROCORTISONE NA SUCCINATE PF 100 MG IJ SOLR
50.0000 mg | Freq: Three times a day (TID) | INTRAMUSCULAR | Status: DC
  Administered 2020-05-26 – 2020-05-28 (×6): 50 mg via INTRAVENOUS
  Filled 2020-05-26 (×6): qty 2

## 2020-05-26 MED ORDER — LABETALOL HCL 5 MG/ML IV SOLN
20.0000 mg | Freq: Once | INTRAVENOUS | Status: AC
  Administered 2020-05-26: 20 mg via INTRAVENOUS
  Filled 2020-05-26: qty 4

## 2020-05-26 MED ORDER — VANCOMYCIN HCL 1750 MG/350ML IV SOLN
1750.0000 mg | INTRAVENOUS | Status: AC
  Administered 2020-05-26: 1750 mg via INTRAVENOUS
  Filled 2020-05-26: qty 350

## 2020-05-26 MED ORDER — FUROSEMIDE 10 MG/ML IJ SOLN
40.0000 mg | Freq: Once | INTRAMUSCULAR | Status: AC
  Administered 2020-05-26: 40 mg via INTRAVENOUS
  Filled 2020-05-26: qty 4

## 2020-05-26 MED ORDER — SODIUM CHLORIDE 0.9 % IV SOLN
2.0000 g | INTRAVENOUS | Status: DC
  Administered 2020-05-27 – 2020-05-28 (×2): 2 g via INTRAVENOUS
  Filled 2020-05-26 (×2): qty 2

## 2020-05-26 MED ORDER — LORAZEPAM 2 MG/ML IJ SOLN
INTRAMUSCULAR | Status: AC
  Administered 2020-05-26: 2 mg
  Filled 2020-05-26: qty 1

## 2020-05-26 MED ORDER — DEXTROSE 5 % IV SOLN: INTRAVENOUS | Status: DC

## 2020-05-26 MED ORDER — SODIUM CHLORIDE 0.9 % IV SOLN: 1.0000 g | Freq: Once | INTRAVENOUS | Status: DC

## 2020-05-26 MED ORDER — SODIUM CHLORIDE 0.9 % IV SOLN
2.0000 g | Freq: Once | INTRAVENOUS | Status: AC
  Administered 2020-05-26: 2 g via INTRAVENOUS
  Filled 2020-05-26: qty 2

## 2020-05-26 MED ORDER — SODIUM CHLORIDE 0.9 % IV SOLN: 2.0000 g | Freq: Once | INTRAVENOUS | Status: DC

## 2020-05-26 MED ORDER — SODIUM BICARBONATE 8.4 % IV SOLN
INTRAVENOUS | Status: DC
  Filled 2020-05-26: qty 150
  Filled 2020-05-26: qty 850

## 2020-05-26 MED ORDER — VANCOMYCIN HCL 1500 MG/300ML IV SOLN: 1500.0000 mg | INTRAVENOUS | Status: DC

## 2020-05-26 MED ORDER — METOPROLOL TARTRATE 5 MG/5ML IV SOLN
5.0000 mg | Freq: Four times a day (QID) | INTRAVENOUS | Status: DC | PRN
  Administered 2020-05-26 – 2020-05-27 (×4): 5 mg via INTRAVENOUS
  Filled 2020-05-26 (×5): qty 5

## 2020-05-26 NOTE — Consult Note (Signed)
NAME:  [name], MRN:  [id number], DOB:  06-04-1946, LOS: 0 ADMISSION DATE:  05/26/2020, CONSULTATION DATE: 05/26/2020 REFERRING MD: Dr. 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  This was completed on May 22, 2020.  Patient was discharged home.  Tissue culture revealed rare gram.positive cocci, pansensitive Proteus.  Patient was given Norco for pain control.  Approximately 8 hours prior to admission to the ED patient was difficult to arouse.  Wife had much difficulty getting him up off of the floor.  Patient was found to be acidotic in the emergency room with a bicarb of 12 and hypertensive.  Also with low-grade fever temperature 100.6.  Concern for sepsis.  Patient was given fluid resuscitation.  Pulmonary critical care consulted for recommendations management.  History obtained from the chart.  Attempted to call patient's wife and there was no answer.  Per hospitalist I discussed case with he was able to speak with the patient's wife.  In the emergency room patient was combative complaining of pain.  Patient received atypical antipsychotics.  Now sedate and confused.  Past Medical History   Past Medical History:  Diagnosis Date  . Anemia   . Atrial fibrillation ([place])   . CHF (congestive heart failure) ([place])   . Chronic kidney disease   . Diabetes mellitus   . Dysrhythmia   . Hyperlipidemia   . Hypertension   . Neuropathy   . Wound of left leg 08/2018     Significant Hospital Events   Stepdown unit admission  Consults:  Pulmonary critical care  Procedures:  05/22/2020: Right index finger amputation  Significant Diagnostic Tests:    Micro Data:  05/22/2020 finger tissue  culture Proteus positive 05/26/2020: COVID-19 negative  Antimicrobials:  Cefepime Metronidazole Vancomycin  Interim history/subjective:  Per HPI above  Objective   Blood pressure (!) 161/69, pulse 74, temperature (!) 100.6 F (38.1 C), temperature source Rectal, resp. rate (!) 23, height 5\' 9"  (1.753 m), weight 88.9 kg, SpO2 91 %.        Intake/Output Summary (Last 24 hours) at 05/26/2020 1007 Last data filed at 05/26/2020 0701 Gross per 24 hour  Intake 100 ml  Output --  Net 100 ml   Filed Weights   05/26/20 0455  Weight: 88.9 kg    Examination: General: Chronically ill debilitated appearing gentleman HENT: NCAT, tracking appropriately, no nystagmus Lungs: Clear to auscultation bilaterally no wheeze no crackles Cardiovascular: Regular rate rhythm, S1-S2 Abdomen: Soft nontender nondistended, prior abdominal surgery, hernia Extremities: Difficult edema, does have skin tears upper extremities and lower extremities bilaterally, bruising Neuro: Alert to voice, confused to location, occasional moaning, moves all 4 extremities GU: Deferred  Resolved Hospital Problem list     Assessment & Plan:   Metabolic acidosis, with compensated respiratory alkalosis, PCO2 26.,  Bicarb 12 Elevated ketones in urine, either related to starvation ketosis, sepsis, versus potential DKA He is not hyperglycemic however could be a mixed picture associated with sepsis possibly. -Tissue culture with pansensitive Proteus 4/0/9811 Acute metabolic encephalopathy secondary to above UDS positive for opiates, which he was prescribed Plan: Continue work-up for metabolic acidosis Check lactate Check beta hydroxybutyrate, may need to consider D5 infusion plus insulin therapy. Continue with additional fluid resuscitation Continue sodium bicarb infusion in D5, changed to  125 cc/h Follow urine output closely Complete cultures, urine, blood cultures Continue cefepime, Vanco, metronidazole Can likely  de-escalate pending culture results Avoid additional sedating drugs, patient received antipsychotics and benzodiazepines in the ED.  Chronic kidney disease, history of renal transplant Chronically immune suppressed Tacrolimus plus mycophenolate Mildly elevated CK Serum creatinine at baseline Plan: Check tacrolimus level Tacrolimus toxicity can also calls encephalopathy. Hold immune suppressants in the setting of sepsis Would recommend consulting nephrology.  Elevated serum troponin Likely demand ischemia, related to acidosis Plan: Continue to observe  Dispo: I agree with SDU admission. High risk for intubation need and respiratory failure due to encephalopathy   Best practice:  Per primary   Labs   CBC: Recent Labs  Lab 05/19/20 1130 05/26/20 0441  WBC 6.0 12.0*  HGB 8.5* 9.8*  HCT 30.5* 34.2*  MCV 98.7 100.3*  PLT 89* 111*    Basic Metabolic Panel: Recent Labs  Lab 05/19/20 1130 05/26/20 0441  NA 143 149*  K 5.2* 5.4*  CL 118* 122*  CO2 14* 12*  GLUCOSE 66* 152*  BUN 83* 74*  CREATININE 3.33* 2.71*  CALCIUM 9.4 9.7   GFR: Estimated Creatinine Clearance: 27.2 mL/min (A) (by C-G formula based on SCr of 2.71 mg/dL (H)). Recent Labs  Lab 05/19/20 1130 05/26/20 0441  WBC 6.0 12.0*    Liver Function Tests: Recent Labs  Lab 05/26/20 0441  AST 63*  ALT 14  ALKPHOS 71  BILITOT 1.4*  PROT 7.0  ALBUMIN 3.9   Recent Labs  Lab 05/26/20 0441  LIPASE 26   Recent Labs  Lab 05/26/20 0443  AMMONIA 27    ABG    Component Value Date/Time   PHART 7.218 (L) 05/26/2020 0910   PCO2ART 26.2 (L) 05/26/2020 0910   PO2ART 68.1 (L) 05/26/2020 0910   HCO3 10.3 (L) 05/26/2020 0910   TCO2 19 (L) 10/12/2019 0751   ACIDBASEDEF 15.9 (H) 05/26/2020 0910   O2SAT 91.0 05/26/2020 0910     Coagulation Profile: Recent Labs  Lab 05/26/20 0441  INR 1.5*    Cardiac Enzymes: Recent Labs  Lab 05/26/20 0441  CKTOTAL 1,566*    HbA1C: Hgb A1c MFr Bld    Date/Time Value Ref Range Status  07/01/2011 01:40 AM 8.8 (H) <5.7 % Final    Comment:    (NOTE)                                                                       According to the ADA Clinical Practice Recommendations for 2011, when HbA1c is used as a screening test:  >=6.5%   Diagnostic of Diabetes Mellitus           (if abnormal result is confirmed) 5.7-6.4%   Increased risk of developing Diabetes Mellitus References:Diagnosis and Classification of Diabetes Mellitus,Diabetes YPPJ,0932,67(TIWPY 1):S62-S69 and Standards of Medical Care in         Diabetes - 2011,Diabetes Care,2011,34 (Suppl 1):S11-S61.    CBG: Recent Labs  Lab 05/22/20 0738 05/22/20 1005 05/26/20 0823  GLUCAP 188* 211* 134*    Review of Systems:   Unable to be obtained secondary to critical illness   Past Medical History  He,  has a past medical history of Anemia, Atrial fibrillation ([place]), CHF (  congestive heart failure) ([place]), Chronic kidney disease, Diabetes mellitus, Dysrhythmia, Hyperlipidemia, Hypertension, Neuropathy, and Wound of left leg (08/2018).   Surgical History    Past Surgical History:  Procedure Laterality Date  . ABDOMINAL AORTOGRAM W/LOWER EXTREMITY N/A 10/12/2019   Procedure: ABDOMINAL AORTOGRAM W/LOWER EXTREMITY;  Surgeon: Angelia Mould, MD;  Location: [facility];  Service: Cardiovascular;  Laterality: N/A;  With CO2  . access proceds    . achlles tendon repair    . AMPUTATION Right 05/22/2020   Procedure: RIGHT INDEX FINGER DEBRIDEMENT AND AMPUTATION;  Surgeon: Leanora Cover, MD;  Location: [facility];  Service: Orthopedics;  Laterality: Right;  . ARTERIOVENOUS GRAFT PLACEMENT    . bowel repla     repair  . ENDOVENOUS ABLATION SAPHENOUS VEIN W/ LASER Right 10/04/2019   endovenous laser ablation right greater saphenous vein by Gae Gallop MD   . [place] and 2005  . LIGATION GORETEX GRAFT  06/30/11   Right upper arm     Social  History   reports that he has never smoked. He has never used smokeless tobacco. He reports that he does not drink alcohol and does not use drugs.   Family History   His family history includes Heart attack in his father; Heart disease in his mother; Stroke in his sister.   Allergies Allergies  Allergen Reactions  . Elavil [Amitriptyline Hcl] Other (See Comments)    Unknown felt as if he was tripping     Home Medications  Prior to Admission medications   Medication Sig Start Date End Date Taking? Authorizing Provider  acetaminophen (TYLENOL) 500 MG tablet Take 1,000 mg by mouth every 6 (six) hours as needed for mild pain or moderate pain.   Yes [provider]  aspirin 81 MG tablet Take 162 mg by mouth 2 (two) times daily.    Yes [provider]  atorvastatin (LIPITOR) 10 MG tablet Take 10 mg by mouth daily.   Yes [provider]  calcitRIOL (ROCALTROL) 0.25 MCG capsule Take 0.25 mcg by mouth daily. daily 07/22/15  Yes [provider]  carvedilol (COREG) 6.25 MG tablet Take 1 tablet (6.25 mg total) by mouth 2 (two) times daily with a meal. 01/29/20  Yes Burtis Junes, NP  diphenhydrAMINE (BENADRYL) 25 mg capsule Take 1 capsule (25 mg total) by mouth at bedtime as needed (for sleep/allergies.). 09/11/18  Yes Regalado, Belkys A, MD  furosemide (LASIX) 40 MG tablet Take 80 mg by mouth daily. Fluid. 04/25/17  Yes Belva Crome, MD  gabapentin (NEURONTIN) 100 MG capsule Take 100 mg by mouth at bedtime. Take three times/day as needed   Yes [provider]  HUMALOG KWIKPEN 100 UNIT/ML KwikPen Inject 3-5 Units into the skin 3 (three) times daily. Sliding scale 07/17/18  Yes [provider]  hydrALAZINE (APRESOLINE) 25 MG tablet Take 25 mg by mouth 3 (three) times daily.  08/22/18  Yes [provider]  icosapent Ethyl (VASCEPA) 1 g capsule Take 2 g by mouth 2 (two) times daily.   Yes [provider]  LANTUS SOLOSTAR 100 UNIT/ML  Solostar Pen Inject 7-15 Units into the skin daily. If blood sugars are >300, use 15 units, <300 use 7 units 06/04/14  Yes [provider]  linagliptin (TRADJENTA) 5 MG TABS tablet Take 5 mg by mouth daily.     Yes [provider]  losartan (COZAAR) 25 MG tablet Take 12.5 mg by mouth  daily. 04/01/20  Yes [provider]  mycophenolate (CELLCEPT) 500 MG tablet Take 500 mg by mouth 2 (two) times daily.    Yes [provider]  oxyCODONE-acetaminophen (PERCOCET/ROXICET) 5-325 MG tablet Take 1-2 tablets by mouth every 6 (six) hours as needed. 05/23/20  Yes [provider]  predniSONE (DELTASONE) 5 MG tablet Take 5 mg by mouth daily. 07/07/18  Yes [provider]  tacrolimus (PROGRAF) 1 MG capsule Take 1 mg by mouth 2 (two) times daily.    Yes [provider]  Alcohol Swabs (B-D SINGLE USE SWABS REGULAR) PADS Use as directed 09/07/15   [provider]  BAYER CONTOUR NEXT TEST test strip Use as directed 09/12/15   [provider]  BD PEN NEEDLE NANO U/F 32G X 4 MM MISC Use as directed 09/25/15   [provider]  latanoprost (XALATAN) 0.005 % ophthalmic solution Place 1 drop into both eyes at bedtime. 07/28/18   [provider]  MICROLET LANCETS MISC Use as directed 07/14/15   [provider]      This patient is critically ill with multiple organ system failure; which, requires frequent high complexity decision making, assessment, support, evaluation, and titration of therapies. This was completed through the application of advanced monitoring technologies and extensive interpretation of multiple databases. During this encounter critical care time was devoted to patient care services described in this note for 33 minutes.  [facility] 05/26/2020 10:07 AM

## 2020-05-26 NOTE — ED Notes (Signed)
Echo tech at bedside.

## 2020-05-26 NOTE — ED Notes (Signed)
Date and time results received: 05/26/20 0535 (use smartphrase ".now" to insert current time)  Test: Troponin Critical Value: 182 Name of Provider Notified: Rancour MD  Orders Received? Or Actions Taken?: waiting on orders

## 2020-05-26 NOTE — Progress Notes (Signed)
A consult was received from an ED physician for Vancomycin and Cefepime per pharmacy dosing.  The patient's profile has been reviewed for ht/wt/allergies/indication/available labs.    A one time order has been placed for Vancomycin 1750mg  IV and Cefepime 2gm IV.    Further antibiotics/pharmacy consults should be ordered by admitting physician if indicated.                       Thank you, Everette Rank, PharmD 05/26/2020  5:08 AM

## 2020-05-26 NOTE — Progress Notes (Signed)
  Echocardiogram 2D Echocardiogram has been performed.  [name] 05/26/2020, 11:37 AM

## 2020-05-26 NOTE — ED Notes (Signed)
Radiology made aware that pt is no longer agitated.  RT made aware of ABG collection.

## 2020-05-26 NOTE — ED Provider Notes (Signed)
[facility] [place] [facility] DEPT Provider Note   CSN: [id number] Arrival date & time: 05/26/20  0422     History Chief Complaint  Patient presents with  . Altered Mental Status    [name] is a 74 y.o. male.  Level 5 caveat.  Patient brought in by EMS after being found down at home.  He was apparently lying on the ground for the about the past 8 hours since 7 PM.  He was complaining of pain to his right side since falling and told EMS he was having neck and back pain.  On arrival he is screaming and yelling uncontrollably.  You will however stop and answer some questions when he is prompted.  He is oriented to person and place.  He states he is hurting in his finger where he had surgery 3 days ago for debridement of a necrotic finger by Dr. Fredna Dow.  His wife thinks he is might be delirious from the pain medication.  Is unclear when his last seen normal was.  EMS reports he was hypertensive and tachycardic.  He was found to have swelling to the right side of his body and feels like he is having "cracking in my back and neck".  Additional history obtained from patient's wife [name] by phone.  She states patient has been on the floor since 7 PM.  Should not see him fall that was asleep at the time.  She is been trying unsuccessfully to get him off the floor since then.  He was complaining of pain to his arm, back and neck.  She states patient and all is normally conversant and can answer questions normally.  She believes the pain medication he is taking from his hand surgery is making him delirious.  She did not know patient had a fever. Patient is no longer on dialysis but still has a fistula which has been since been ligated.  He had a renal transplant 2005.  He has a history of atrial fibrillation but is not on blood thinners.  He was found to be febrile on arrival 100.6.  Tachycardic and hypertensive. He is screaming and yelling and not able to give any reasonable  history.  The history is provided by the patient.       Past Medical History:  Diagnosis Date  . Anemia   . Atrial fibrillation ([place])   . CHF (congestive heart failure) ([place])   . Chronic kidney disease   . Diabetes mellitus   . Dysrhythmia   . Hyperlipidemia   . Hypertension   . Neuropathy   . Wound of left leg 08/2018    Patient Active Problem List   Diagnosis Date Noted  . Acute metabolic encephalopathy 42/35/3614  . Anemia due to chronic kidney disease 09/06/2018  . 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 Community acquired pneumonia 09/06/2018  . Chronic diastolic heart failure ([place]) 03/25/2017  . Essential hypertension 03/25/2017  . Chronic anticoagulation 02/04/2017  . Dyspnea 02/03/2017  . 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 Kidney transplant status, cadaveric 10/15/2015  . Immunosuppressed status ([place]) 10/15/2015  . Paronychia of third finger of right hand 07/16/2011  . ESRD (end stage renal disease) ([place]) 07/16/2011    Past Surgical History:  Procedure Laterality Date  . ABDOMINAL AORTOGRAM W/LOWER EXTREMITY N/A 10/12/2019   Procedure: ABDOMINAL AORTOGRAM W/LOWER EXTREMITY;  Surgeon: Angelia Mould, MD;  Location: [facility];  Service: Cardiovascular;  Laterality: N/A;  With CO2  . access proceds    . achlles tendon repair    . AMPUTATION Right 05/22/2020   Procedure: RIGHT INDEX FINGER DEBRIDEMENT AND AMPUTATION;  Surgeon: Leanora Cover, MD;  Location: [facility];  Service: Orthopedics;  Laterality: Right;  . ARTERIOVENOUS GRAFT PLACEMENT    . bowel repla     repair  . ENDOVENOUS ABLATION SAPHENOUS VEIN W/ LASER Right 10/04/2019   endovenous laser ablation right greater saphenous vein by Gae Gallop MD   . [place] and 2005  . LIGATION GORETEX GRAFT  06/30/11   Right upper arm       Family History  Problem Relation Age of Onset  . Heart disease  Mother   . Heart attack Father   . Stroke Sister     Social History   Tobacco Use  . Smoking status: Never Smoker  . Smokeless tobacco: Never Used  Vaping Use  . Vaping Use: Never used  Substance Use Topics  . Alcohol use: No    Alcohol/week: 0.0 standard drinks  . Drug use: No    Home Medications Prior to Admission medications   Medication Sig Start Date End Date Taking? Authorizing Provider  Alcohol Swabs (B-D SINGLE USE SWABS REGULAR) PADS Use as directed 09/07/15   [provider]  aspirin 81 MG tablet Take 81 mg by mouth daily.      [provider]  atorvastatin (LIPITOR) 10 MG tablet Take 10 mg by mouth daily.    [provider]  BAYER CONTOUR NEXT TEST test strip Use as directed 09/12/15   [provider]  BD PEN NEEDLE NANO U/F 32G X 4 MM MISC Use as directed 09/25/15   [provider]  calcitRIOL (ROCALTROL) 0.25 MCG capsule Take 0.25 mcg by mouth daily. daily 07/22/15   [provider]  carvedilol (COREG) 6.25 MG tablet Take 1 tablet (6.25 mg total) by mouth 2 (two) times daily with a meal. 01/29/20   Burtis Junes, NP  cephALEXin (KEFLEX) 500 MG capsule Take 500 mg by mouth 2 (two) times daily. 04/01/20   [provider]  diphenhydrAMINE (BENADRYL) 25 mg capsule Take 1 capsule (25 mg total) by mouth at bedtime as needed (for sleep/allergies.). 09/11/18   Regalado, Belkys A, MD  furosemide (LASIX) 40 MG tablet Take 80 mg by mouth daily. Fluid. 04/25/17   Belva Crome, MD  gabapentin (NEURONTIN) 100 MG capsule Take 100 mg by mouth 3 (three) times daily as needed (for nerve pain). Take three times/day as needed    [provider]  HUMALOG KWIKPEN 100 UNIT/ML KwikPen Inject 3-5 Units into the skin 3 (three) times daily. 07/17/18   [provider]  hydrALAZINE (APRESOLINE) 25 MG tablet Take 25 mg by mouth 3 (three) times daily.  08/22/18   [provider]  HYDROcodone-acetaminophen (NORCO)  5-325 MG tablet 1-2 tabs po q6 hours prn pain 05/22/20   Leanora Cover, MD  LANTUS SOLOSTAR 100 UNIT/ML Solostar Pen Inject 7-15 Units into the skin daily. If blood sugars are >300, use 15 units, <300 use 7 units 06/04/14   [provider]  latanoprost (XALATAN) 0.005 % ophthalmic solution Place 1 drop into both eyes at bedtime. 07/28/18   [provider]  linagliptin (TRADJENTA) 5 MG TABS tablet Take 5 mg by mouth daily.      [provider]  losartan (COZAAR) 25 MG tablet Take 12.5 mg by mouth daily. 04/01/20  [provider]  MICROLET LANCETS MISC Use as directed 07/14/15   [provider]  mycophenolate (CELLCEPT) 500 MG tablet Take 500 mg by mouth 2 (two) times daily.     [provider]  Omega-3 Fatty Acids (FISH OIL) 1000 MG CAPS Take by mouth.    [provider]  predniSONE (DELTASONE) 5 MG tablet Take 5 mg by mouth daily. 07/07/18   [provider]  tacrolimus (PROGRAF) 1 MG capsule Take 1 mg by mouth 2 (two) times daily.     [provider]    Allergies    Elavil [amitriptyline hcl]  Review of Systems   Review of Systems  Unable to perform ROS: Mental status change    Physical Exam Updated Vital Signs BP (!) 224/102 (BP Location: Left Arm)   Pulse (!) 110   Temp (!) 100.6 F (38.1 C) (Rectal)   Resp (!) 22   SpO2 93%   Physical Exam Constitutional:      General: He is in acute distress.     Appearance: Normal appearance. He is ill-appearing.     Comments: Disheveled, screaming and yelling  Will stop screaming long enough to answer a few basic questions.  Knows his name and that he is at the hospital.  HENT:     Head:     Comments: Asymmetric edema to the right eyelid compared to left    Nose: No rhinorrhea.     Mouth/Throat:     Mouth: Mucous membranes are dry.     Comments: Dried blood to mouth and lips bilaterally Eyes:     Extraocular Movements: Extraocular movements intact.      Pupils: Pupils are equal, round, and reactive to light.  Neck:     Comments: C-collar in place, no midline pain Cardiovascular:     Rate and Rhythm: Normal rate.     Pulses: Normal pulses.  Pulmonary:     Effort: Pulmonary effort is normal.     Breath sounds: No wheezing.     Comments: Asymmetric edema to right chest wall and right arm.  There are a few bullous lesions to right axilla Chest:     Chest wall: Tenderness present.  Abdominal:     Tenderness: There is no abdominal tenderness. There is no guarding or rebound.  Musculoskeletal:        General: Swelling and signs of injury present.     Comments: Pelvis is stable.  Patient yells with palpation of the right hip which is mildly erythematous.  There is diffuse tenderness across his thoracic and lumbar spine. He is able to wiggle his toes bilaterally.  Able to range his hips but yells with any kind of movement.  Surgical dressing in place to right index finger.  The skin tear right dorsal forearm  Neurological:     Mental Status: He is alert.     Comments: Screaming and yelling but will stop long enough to answer a few basic questions.  Oriented x2.  Equal grip strengths.  Moving all extremities equally.     ED Results / Procedures / Treatments   Labs (all labs ordered are listed, but only abnormal results are displayed) Labs Reviewed  COMPREHENSIVE METABOLIC PANEL - Abnormal; Notable for the following components:      Result Value   Sodium 149 (*)    Potassium 5.4 (*)    Chloride 122 (*)    CO2 12 (*)    Glucose, Bld 152 (*)  BUN 74 (*)    Creatinine, Ser 2.71 (*)    AST 63 (*)    Total Bilirubin 1.4 (*)    GFR calc non Af Amer 22 (*)    GFR calc Af Amer 26 (*)    All other components within normal limits  CBC - Abnormal; Notable for the following components:   WBC 12.0 (*)    RBC 3.41 (*)    Hemoglobin 9.8 (*)    HCT 34.2 (*)    MCV 100.3 (*)    MCHC 28.7 (*)    Platelets 111 (*)    All other components  within normal limits  URINALYSIS, ROUTINE W REFLEX MICROSCOPIC - Abnormal; Notable for the following components:   Hgb urine dipstick LARGE (*)    Ketones, ur 40 (*)    Protein, ur 100 (*)    All other components within normal limits  CK - Abnormal; Notable for the following components:   Total CK 1,566 (*)    All other components within normal limits  PROTIME-INR - Abnormal; Notable for the following components:   Prothrombin Time 17.1 (*)    INR 1.5 (*)    All other components within normal limits  ACETAMINOPHEN LEVEL - Abnormal; Notable for the following components:   Acetaminophen (Tylenol), Serum <10 (*)    All other components within normal limits  SALICYLATE LEVEL - Abnormal; Notable for the following components:   Salicylate Lvl <7.9 (*)    All other components within normal limits  RAPID URINE DRUG SCREEN, HOSP PERFORMED - Abnormal; Notable for the following components:   Opiates POSITIVE (*)    All other components within normal limits  URINALYSIS, MICROSCOPIC (REFLEX) - Abnormal; Notable for the following components:   Bacteria, UA RARE (*)    All other components within normal limits  TROPONIN I (HIGH SENSITIVITY) - Abnormal; Notable for the following components:   Troponin I (High Sensitivity) 182 (*)    All other components within normal limits  SARS CORONAVIRUS 2 BY RT PCR (HOSPITAL ORDER, [facility])  CULTURE, BLOOD (ROUTINE X 2)  CULTURE, BLOOD (ROUTINE X 2)  URINE CULTURE  ETHANOL  LIPASE, BLOOD  AMMONIA  TSH  BRAIN NATRIURETIC PEPTIDE  CBG MONITORING, ED  CBG MONITORING, ED  TROPONIN I (HIGH SENSITIVITY)    EKG EKG Interpretation  Date/Time:  Monday May 26 2020 04:37:31 EDT Ventricular Rate:  109 PR Interval:    QRS Duration: 89 QT Interval:  312 QTC Calculation: 421 R Axis:   -51 Text Interpretation: Sinus rhythm Ventricular premature complex Probable inferior infarct, recent Probable anteroseptal infarct, old  Lateral leads are also involved ST depressions laterally similar to previous Confirmed by Ezequiel Essex [phone]) on 05/26/2020 4:43:33 AM   Radiology CT ABDOMEN PELVIS WO CONTRAST  Result Date: 05/26/2020 CLINICAL DATA:  Fall, found down EXAM: CT CHEST, ABDOMEN AND PELVIS WITHOUT CONTRAST TECHNIQUE: Multidetector CT imaging of the chest, abdomen and pelvis was performed following the standard protocol without IV contrast. COMPARISON:  None. FINDINGS: Motion degraded images. CT CHEST FINDINGS Cardiovascular: Cardiomegaly. No pericardial effusion. Chronic pericardial calcifications. No evidence of thoracic aortic aneurysm. Atherosclerotic calcifications of the aortic arch. Enlargement of the main pulmonary artery, suggesting pulmonary arterial hypertension. Three vessel coronary atherosclerosis. Mediastinum/Nodes: No suspicious mediastinal lymphadenopathy. Visualized thyroid is unremarkable. Lungs/Pleura: Evaluation of the lung parenchyma is constrained by respiratory motion. Within that constraint, there are no suspicious pulmonary nodules. Small right and trace left pleural effusions. Mild right  basilar atelectasis.  No focal consolidation. No pneumothorax. Musculoskeletal: No acute fracture is seen. Specifically, the sternum, clavicles, and scapulae are intact. Ribs are poorly evaluated due to motion degradation. Old left rib fracture deformities without definite acute fracture. Dedicated thoracic spine evaluation will be reported separately. CT ABDOMEN PELVIS FINDINGS Hepatobiliary: Unenhanced liver is grossly unremarkable. No perihepatic fluid/hemorrhage. Suspected layering gallbladder sludge (series 2/image 25), without associated inflammatory changes. No intrahepatic or extrahepatic ductal dilatation. Pancreas: Within normal limits. Spleen: Within normal limits.  No perisplenic fluid/hemorrhage. Adrenals/Urinary Tract: Adrenal glands are within normal limits. Severe bilateral renal cortical atrophy with a  dominant 3.6 cm right upper pole renal cyst. No hydronephrosis. Calcified, amorphous right lower quadrant renal transplant with sequela of rejection (series 2/image 54). Left lower quadrant renal transplant without hydronephrosis. Bladder is within normal limits. Stomach/Bowel: Stomach is within normal limits. No evidence of bowel obstruction. Normal appendix (series 2/image 22). Extensive left colonic diverticulosis, without evidence of diverticulitis. Vascular/Lymphatic: No evidence of abdominal aortic aneurysm. Extensive atherosclerotic calcifications the abdominal aorta and branch vessels. No suspicious abdominopelvic lymphadenopathy. Reproductive: Prostate is grossly unremarkable. Other: No abdominopelvic ascites. No hemoperitoneum or free air. Musculoskeletal: No fracture is seen. Specifically, the visualized bony pelvis and proximal femurs are intact. Dedicated lumbar spine evaluation will be reported separately. IMPRESSION: Motion degraded images. No evidence of traumatic injury to the chest, abdomen, or pelvis. Dedicated thoracolumbar spine evaluation will be reported separately. Small right and trace left pleural effusions. Additional chronic ancillary findings as above. Electronically Signed   By: Julian Hy M.D.   On: 05/26/2020 06:41   DG Chest 1 View  Result Date: 05/26/2020 CLINICAL DATA:  Altered mental status EXAM: CHEST  1 VIEW COMPARISON:  09/06/2018 FINDINGS: Moderate cardiomegaly. Hazy opacities in the right lung may be exaggerated by rotation. Right pleural effusion. IMPRESSION: Right pleural effusion and moderate cardiomegaly. Electronically Signed   By: Ulyses Jarred M.D.   On: 05/26/2020 06:48   DG Pelvis 1-2 Views  Result Date: 05/26/2020 CLINICAL DATA:  Fall found down EXAM: PELVIS - 1-2 VIEW COMPARISON:  None. FINDINGS: There is no evidence of pelvic fracture or diastasis. No pelvic bone lesions are seen. IMPRESSION: Negative. Electronically Signed   By: Ulyses Jarred M.D.    On: 05/26/2020 06:49   CT Head Wo Contrast  Result Date: 05/26/2020 CLINICAL DATA:  Fall EXAM: CT HEAD WITHOUT CONTRAST CT CERVICAL SPINE WITHOUT CONTRAST TECHNIQUE: Multidetector CT imaging of the head and cervical spine was performed following the standard protocol without intravenous contrast. Multiplanar CT image reconstructions of the cervical spine were also generated. COMPARISON:  None. FINDINGS: CT HEAD FINDINGS Brain: There is no mass, hemorrhage or extra-axial collection. The size and configuration of the ventricles and extra-axial CSF spaces are normal. There is hypoattenuation of the periventricular white matter, most commonly indicating chronic ischemic microangiopathy. Vascular: Atherosclerotic calcification of the vertebral and internal carotid arteries at the skull base. No abnormal hyperdensity of the major intracranial arteries or dural venous sinuses. Skull: Small hematoma at the scalp vertex.  No skull fracture. Sinuses/Orbits: No fluid levels or advanced mucosal thickening of the visualized paranasal sinuses. No mastoid or middle ear effusion. The orbits are normal. CT CERVICAL SPINE FINDINGS Alignment: No static subluxation. Facets are aligned. Occipital condyles are normally positioned. Skull base and vertebrae: No acute fracture. Soft tissues and spinal canal: No prevertebral fluid or swelling. No visible canal hematoma. Disc levels: C5-7 degenerative disc disease. Upper chest: No pneumothorax, pulmonary nodule or pleural effusion.  Other: Normal visualized paraspinal cervical soft tissues. IMPRESSION: 1. 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 2. Small scalp vertex hematoma without skull fracture. 3. No acute fracture or static subluxation of the cervical spine. Electronically Signed   By: Ulyses Jarred M.D.   On: 05/26/2020 06:44   CT Chest Wo Contrast  Result Date: 05/26/2020 CLINICAL DATA:  Fall, found down EXAM: CT CHEST, ABDOMEN AND PELVIS WITHOUT  CONTRAST TECHNIQUE: Multidetector CT imaging of the chest, abdomen and pelvis was performed following the standard protocol without IV contrast. COMPARISON:  None. FINDINGS: Motion degraded images. CT CHEST FINDINGS Cardiovascular: Cardiomegaly. No pericardial effusion. Chronic pericardial calcifications. No evidence of thoracic aortic aneurysm. Atherosclerotic calcifications of the aortic arch. Enlargement of the main pulmonary artery, suggesting pulmonary arterial hypertension. Three vessel coronary atherosclerosis. Mediastinum/Nodes: No suspicious mediastinal lymphadenopathy. Visualized thyroid is unremarkable. Lungs/Pleura: Evaluation of the lung parenchyma is constrained by respiratory motion. Within that constraint, there are no suspicious pulmonary nodules. Small right and trace left pleural effusions. Mild right basilar atelectasis.  No focal consolidation. No pneumothorax. Musculoskeletal: No acute fracture is seen. Specifically, the sternum, clavicles, and scapulae are intact. Ribs are poorly evaluated due to motion degradation. Old left rib fracture deformities without definite acute fracture. Dedicated thoracic spine evaluation will be reported separately. CT ABDOMEN PELVIS FINDINGS Hepatobiliary: Unenhanced liver is grossly unremarkable. No perihepatic fluid/hemorrhage. Suspected layering gallbladder sludge (series 2/image 25), without associated inflammatory changes. No intrahepatic or extrahepatic ductal dilatation. Pancreas: Within normal limits. Spleen: Within normal limits.  No perisplenic fluid/hemorrhage. Adrenals/Urinary Tract: Adrenal glands are within normal limits. Severe bilateral renal cortical atrophy with a dominant 3.6 cm right upper pole renal cyst. No hydronephrosis. Calcified, amorphous right lower quadrant renal transplant with sequela of rejection (series 2/image 54). Left lower quadrant renal transplant without hydronephrosis. Bladder is within normal limits. Stomach/Bowel: Stomach  is within normal limits. No evidence of bowel obstruction. Normal appendix (series 2/image 22). Extensive left colonic diverticulosis, without evidence of diverticulitis. Vascular/Lymphatic: No evidence of abdominal aortic aneurysm. Extensive atherosclerotic calcifications the abdominal aorta and branch vessels. No suspicious abdominopelvic lymphadenopathy. Reproductive: Prostate is grossly unremarkable. Other: No abdominopelvic ascites. No hemoperitoneum or free air. Musculoskeletal: No fracture is seen. Specifically, the visualized bony pelvis and proximal femurs are intact. Dedicated lumbar spine evaluation will be reported separately. IMPRESSION: Motion degraded images. No evidence of traumatic injury to the chest, abdomen, or pelvis. Dedicated thoracolumbar spine evaluation will be reported separately. Small right and trace left pleural effusions. Additional chronic ancillary findings as above. Electronically Signed   By: Julian Hy M.D.   On: 05/26/2020 06:41   CT Cervical Spine Wo Contrast  Result Date: 05/26/2020 CLINICAL DATA:  Fall EXAM: CT HEAD WITHOUT CONTRAST CT CERVICAL SPINE WITHOUT CONTRAST TECHNIQUE: Multidetector CT imaging of the head and cervical spine was performed following the standard protocol without intravenous contrast. Multiplanar CT image reconstructions of the cervical spine were also generated. COMPARISON:  None. FINDINGS: CT HEAD FINDINGS Brain: There is no mass, hemorrhage or extra-axial collection. The size and configuration of the ventricles and extra-axial CSF spaces are normal. There is hypoattenuation of the periventricular white matter, most commonly indicating chronic ischemic microangiopathy. Vascular: Atherosclerotic calcification of the vertebral and internal carotid arteries at the skull base. No abnormal hyperdensity of the major intracranial arteries or dural venous sinuses. Skull: Small hematoma at the scalp vertex.  No skull fracture. Sinuses/Orbits: No fluid  levels or advanced mucosal thickening of the visualized paranasal sinuses. No mastoid or middle  ear effusion. The orbits are normal. CT CERVICAL SPINE FINDINGS Alignment: No static subluxation. Facets are aligned. Occipital condyles are normally positioned. Skull base and vertebrae: No acute fracture. Soft tissues and spinal canal: No prevertebral fluid or swelling. No visible canal hematoma. Disc levels: C5-7 degenerative disc disease. Upper chest: No pneumothorax, pulmonary nodule or pleural effusion. Other: Normal visualized paraspinal cervical soft tissues. IMPRESSION: 1. 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 2. Small scalp vertex hematoma without skull fracture. 3. No acute fracture or static subluxation of the cervical spine. Electronically Signed   By: Ulyses Jarred M.D.   On: 05/26/2020 06:44   CT T-SPINE NO CHARGE  Result Date: 05/26/2020 CLINICAL DATA:  Fall EXAM: CT Thoracic and Lumbar spine without contrast TECHNIQUE: Multiplanar CT images of the thoracic and lumbar spine were reconstructed from contemporary CT of the Chest, Abdomen, and Pelvis CONTRAST:  No additional contrast COMPARISON:  None FINDINGS: CT THORACIC SPINE FINDINGS Alignment: Normal. Vertebrae: No acute fracture or focal pathologic process. Disc levels: No spinal canal stenosis CT LUMBAR SPINE FINDINGS Segmentation: 5 lumbar type vertebrae. Alignment: Normal. Vertebrae: No acute fracture or focal pathologic process. Paraspinal and other soft tissues: Negative. Disc levels: No spinal canal stenosis. IMPRESSION: No acute fracture or static subluxation of the thoracic or lumbar spine. Electronically Signed   By: Ulyses Jarred M.D.   On: 05/26/2020 06:35   CT L-SPINE NO CHARGE  Result Date: 05/26/2020 CLINICAL DATA:  Fall EXAM: CT Thoracic and Lumbar spine without contrast TECHNIQUE: Multiplanar CT images of the thoracic and lumbar spine were reconstructed from contemporary CT of the Chest, Abdomen,  and Pelvis CONTRAST:  No additional contrast COMPARISON:  None FINDINGS: CT THORACIC SPINE FINDINGS Alignment: Normal. Vertebrae: No acute fracture or focal pathologic process. Disc levels: No spinal canal stenosis CT LUMBAR SPINE FINDINGS Segmentation: 5 lumbar type vertebrae. Alignment: Normal. Vertebrae: No acute fracture or focal pathologic process. Paraspinal and other soft tissues: Negative. Disc levels: No spinal canal stenosis. IMPRESSION: No acute fracture or static subluxation of the thoracic or lumbar spine. Electronically Signed   By: Ulyses Jarred M.D.   On: 05/26/2020 06:35    Procedures .Critical Care Performed by: Ezequiel Essex, MD Authorized by: Ezequiel Essex, MD   Critical care provider statement:    Critical care time (minutes):  45   Critical care was necessary to treat or prevent imminent or life-threatening deterioration of the following conditions:  Sepsis and trauma   Critical care was time spent personally by me on the following activities:  Discussions with consultants, evaluation of patient's response to treatment, examination of patient, ordering and performing treatments and interventions, ordering and review of laboratory studies, ordering and review of radiographic studies, pulse oximetry, re-evaluation of patient's condition, obtaining history from patient or surrogate and review of old charts   (including critical care time)  Medications Ordered in ED Medications  LORazepam (ATIVAN) 2 MG/ML injection (2 mg  Given 05/26/20 0446)    ED Course  I have reviewed the triage vital signs and the nursing notes.  Pertinent labs & imaging results that were available during my care of the patient were reviewed by me and considered in my medical decision making (see chart for details).    MDM Rules/Calculators/A&P                          Fall with altered mental status. Patient laying on the floor for approximately 8 hours.  Found  to be tachycardic, febrile,  hypertensive on arrival.  Developing sepsis order set utilized.  Patient started on broad-spectrum antibiotics as well as IV fluids.  Concern for rhabdomyolysis and dehydration.  Patient no longer on dialysis.  Labs show creatinine of 2.7 which appears to be near his baseline.  Potassium 5.4.  Troponin 182. CK 1566. Hypernatremia of 149.  Patient belligerent and yelling.  Did require chemical sedation for patient and staff safety.  As well as for expedition of work-up. He is protecting his airway.   CT head and C-spine are negative.  Given patient's lack of cooperation and difficulty with evaluation, full trauma CTs were obtained without contrast.  There is no evidence of acute traumatic injury to chest, abdomen, or pelvis.  Thoracic and Lumbar spine x-rays are negative. Small pleural effusions bilaterally without respiratory distress or hypoxia.  Patient still uncooperative and yelling and encephalopathic.  Unclear whether this is from his pain medication that he has been taking at home.  No significant traumatic pathology identified.  Does have mild dehydration hyperkalemia and rhabdomyolysis but this would not explain his mental status.  Antibiotics given after cultures obtained given his fever on arrival.  Will be admission for acute encephalopathy.  Discussed with Dr. Maryln Gottron.  Dennys Guin was evaluated in Emergency Department on 05/26/2020 for the symptoms described in the history of present illness. He was evaluated in the context of the global COVID-19 pandemic, which necessitated consideration that the patient might be at risk for infection with the SARS-CoV-2 virus that causes COVID-19. Institutional protocols and algorithms that pertain to the evaluation of patients at risk for COVID-19 are in a state of rapid change based on information released by regulatory bodies including the CDC and federal and state organizations. These policies and algorithms were followed during the patient's  care in the ED.  Final Clinical Impression(s) / ED Diagnoses Final diagnoses:  Fall  Acute encephalopathy  Non-traumatic rhabdomyolysis  Fever, unspecified fever cause    Rx / DC Orders ED Discharge Orders    None       Naphtali Riede, Annie Main, MD 05/26/20 0725

## 2020-05-26 NOTE — Consult Note (Addendum)
Renal Service Consult Note [facility]  [name] 05/26/2020 [name] Requesting Physician: Dr Neysa Bonito  Reason for Consult:  Transplant patient sp fall HPI: The patient is a 74 y.o. year-old w/ hx of HTN, HL, DM2, CHF, atrial fib, esrd hx of kidney transplant (1983, 2005) who had recent R 2nd finger amputation last week on 9/02, was found on the ground after a fall c/o back and neck pain.  Pt brought to ED where BP 224/ 102, HR 110, RR 20 and temp 100.6, SpO2 93% on RA.  Required sedation for agitation.  CT head/ neck/ body showed no fractures or traumatic injury in the chest/ abd / pelvis. Labs showed Na 149  BUN 74, creat 2.71, Hb 9.8.  UA negative. Pt rec'd IV vanc/ cefepime, 1L saline bolus, IV labetalol 20 x 1 and Geodon 10 mg IM. Asked to see for renal transplant status.   Pt seen in ED, getting bicarb IVF"s.  Confused and sedated now, not in distress, provided no hx.   ROS - n/a   Past Medical History  Past Medical History:  Diagnosis Date  . Anemia   . Atrial fibrillation ([place])   . CHF (congestive heart failure) ([place])   . Chronic kidney disease   . Diabetes mellitus   . Dysrhythmia   . Hyperlipidemia   . Hypertension   . Neuropathy   . Wound of left leg 08/2018   Past Surgical History  Past Surgical History:  Procedure Laterality Date  . ABDOMINAL AORTOGRAM W/LOWER EXTREMITY N/A 10/12/2019   Procedure: ABDOMINAL AORTOGRAM W/LOWER EXTREMITY;  Surgeon: Angelia Mould, MD;  Location: [place];  Service: Cardiovascular;  Laterality: N/A;  With CO2  . access proceds    . achlles tendon repair    . AMPUTATION Right 05/22/2020   Procedure: RIGHT INDEX FINGER DEBRIDEMENT AND AMPUTATION;  Surgeon: Leanora Cover, MD;  Location: [place];  Service: Orthopedics;  Laterality: Right;  . ARTERIOVENOUS GRAFT PLACEMENT    . bowel repla     repair  . ENDOVENOUS ABLATION SAPHENOUS VEIN W/ LASER Right 10/04/2019   endovenous  laser ablation right greater saphenous vein by Gae Gallop MD   . [place] and 2005  . LIGATION GORETEX GRAFT  06/30/11   Right upper arm   Family History  Family History  Problem Relation Age of Onset  . Heart disease Mother   . Heart attack Father   . Stroke Sister    Social History  reports that he has never smoked. He has never used smokeless tobacco. He reports that he does not drink alcohol and does not use drugs. Allergies  Allergies  Allergen Reactions  . Elavil [Amitriptyline Hcl] Other (See Comments)    Unknown felt as if he was tripping   Home medications Prior to Admission medications   Medication Sig Start Date End Date Taking? Authorizing Provider  acetaminophen (TYLENOL) 500 MG tablet Take 1,000 mg by mouth every 6 (six) hours as needed for mild pain or moderate pain.   Yes [provider]  aspirin 81 MG tablet Take 162 mg by mouth 2 (two) times daily.    Yes [provider]  atorvastatin (LIPITOR) 10 MG tablet Take 10 mg by mouth daily.   Yes [provider]  calcitRIOL (ROCALTROL) 0.25 MCG capsule Take 0.25 mcg by mouth daily. daily 07/22/15  Yes [provider]  carvedilol (COREG) 6.25 MG tablet Take 1  tablet (6.25 mg total) by mouth 2 (two) times daily with a meal. 01/29/20  Yes Burtis Junes, NP  diphenhydrAMINE (BENADRYL) 25 mg capsule Take 1 capsule (25 mg total) by mouth at bedtime as needed (for sleep/allergies.). 09/11/18  Yes Regalado, Belkys A, MD  furosemide (LASIX) 40 MG tablet Take 80 mg by mouth daily. Fluid. 04/25/17  Yes Belva Crome, MD  gabapentin (NEURONTIN) 100 MG capsule Take 100 mg by mouth at bedtime. Take three times/day as needed   Yes [provider]  HUMALOG KWIKPEN 100 UNIT/ML KwikPen Inject 3-5 Units into the skin 3 (three) times daily. Sliding scale 07/17/18  Yes [provider]  hydrALAZINE (APRESOLINE) 25 MG tablet Take 25 mg by mouth 3 (three) times daily.   08/22/18  Yes [provider]  icosapent Ethyl (VASCEPA) 1 g capsule Take 2 g by mouth 2 (two) times daily.   Yes [provider]  LANTUS SOLOSTAR 100 UNIT/ML Solostar Pen Inject 7-15 Units into the skin daily. If blood sugars are >300, use 15 units, <300 use 7 units 06/04/14  Yes [provider]  linagliptin (TRADJENTA) 5 MG TABS tablet Take 5 mg by mouth daily.     Yes [provider]  losartan (COZAAR) 25 MG tablet Take 12.5 mg by mouth daily. 04/01/20  Yes [provider]  mycophenolate (CELLCEPT) 500 MG tablet Take 500 mg by mouth 2 (two) times daily.    Yes [provider]  oxyCODONE-acetaminophen (PERCOCET/ROXICET) 5-325 MG tablet Take 1-2 tablets by mouth every 6 (six) hours as needed. 05/23/20  Yes [provider]  predniSONE (DELTASONE) 5 MG tablet Take 5 mg by mouth daily. 07/07/18  Yes [provider]  tacrolimus (PROGRAF) 1 MG capsule Take 1 mg by mouth 2 (two) times daily.    Yes [provider]  Alcohol Swabs (B-D SINGLE USE SWABS REGULAR) PADS Use as directed 09/07/15   [provider]  BAYER CONTOUR NEXT TEST test strip Use as directed 09/12/15   [provider]  BD PEN NEEDLE NANO U/F 32G X 4 MM MISC Use as directed 09/25/15   [provider]  latanoprost (XALATAN) 0.005 % ophthalmic solution Place 1 drop into both eyes at bedtime. 07/28/18   [provider]  MICROLET LANCETS MISC Use as directed 07/14/15   [provider]     Vitals:   05/26/20 0530 05/26/20 0800 05/26/20 0900 05/26/20 1100  BP: (!) 201/98 (!) 154/74 (!) 161/69 (!) 165/78  Pulse: (!) 113 75 74 81  Resp: (!) 25 17 (!) 23 (!) 23  Temp:      TempSrc:      SpO2: 90% 90% 91% 92%  Weight:      Height:       Exam Gen elderly WM, confused/ sedated in ED, no distress, on RA 95% No rash, cyanosis or gangrene Sclera anicteric, throat a bit dry Bounding carotid vs jvd Chest some scattered  crackles at bases RRR no MRG  Abd soft ntnd no mass or ascites +bs GU normal male MS no joint effusions or deformity Ext no LE or UE edema, has some erythema mid shin bilat, tender to touch his feet Neuro is lethargic, not responding much, sp Geodon    Home meds:  - asa 81/ lipitor 10/ coreg 6.25 bid/ lasix 80/ hydralazine 25 tid/ losartan 12.5  - pred 5/ prograf 1 bid/ mycophenolate 500 bid  - neurontin 100 hx/ percocet 1-2 qid prn  -  insulin lantus 7- 15u qd/ linagliptin 5 qd/ humalog 3-5 u ac  - prn's/ vitamins/ supplements    Na 149  K 5.4  CO2 12 Cl 122  BUN 74  Cr 2.71  eGFR 22                   beta-Hba ^4.35   BNP 809  CK 1666  Trop 226, 182   WBC 12k  Hb 9.8     UA clear, 40 ket, 100 prot, 6-10 rbc, 0-5 wbc    Baseline creat 2.7- 3.3 jan - sept 2021   CXR hazy R lung poss layering effusion, no L side changes   CT chest - mod R effusion, no consolidation, no edema    Last office creat 2.0 in July 2021  Assessment/ Plan: 1. Renal transplant/ AoCKD IV - confused, sedated now. B/l creat 2.0 in July 2021. Consulting pharm to assist w/ IS meds.  Will give IV hydrocortisone now, SL prograf would be ideal if he can take, next would be IV infusion, consulting pharm. Can hold mycophenolate for now. Looks possibly dry , getting IVF's.  2. Fever/ poss sepsis - w/ fever/ ^WBC. Immunosuppressed is now on IV abx. Recent finger amputation. Had tender lower legs and feet, ? Gout flare. Would dc IV vanc as soon as possible if not needed given CKD IV. Per pmd.  3. Hypernatremia - may be dry, agree w/ IVF"s hypotonic, will adjust if needed.  4. Volume - BP's up. Getting IVF's. Watch resp status closely.  5. Agitation - check prograf level, will take 3 days to come back.  6. IDDM - per pmd    Kelly Splinter  MD 05/26/2020, 11:47 AM  Recent Labs  Lab 05/26/20 0441  WBC 12.0*  HGB 9.8*   Recent Labs  Lab 05/26/20 0441  K 5.4*  BUN 74*  CREATININE 2.71*  CALCIUM 9.7

## 2020-05-26 NOTE — Progress Notes (Signed)
Pharmacy Antibiotic Note  [name] is a 74 y.o. male admitted on 05/26/2020 with sepsis.  Pharmacy has been consulted for Vancomycin and Cefepime dosing. PMH: 1983 & 2005 renal transplant, Afib- no coag, CHF, CKD, DM, HPL, HTN  Plan: Cefepime 2gm q24 Vancomycin 1750mg  x1, then 1500mg  q48 (AUC 471, est Cmax 33, Cmin 10) Daily Serum Creatinine  Height: 5\' 9"  (175.3 cm) Weight: 88.9 kg (195 lb 15.8 oz) IBW/kg (Calculated) : 70.7  Temp (24hrs), Avg:100.6 F (38.1 C), Min:100.6 F (38.1 C), Max:100.6 F (38.1 C)  Recent Labs  Lab 05/19/20 1130 05/26/20 0441  WBC 6.0 12.0*  CREATININE 3.33* 2.71*    Estimated Creatinine Clearance: 27.2 mL/min (A) (by C-G formula based on SCr of 2.71 mg/dL (H)).    Allergies  Allergen Reactions  . Elavil [Amitriptyline Hcl] Other (See Comments)    Unknown felt as if he was tripping    Antimicrobials this admission: 9/6 Cefepime >>  9/6 Vancomycin >>   Dose adjustments this admission:  Microbiology results: 9/6 BCx x1set: sent 9/6 UCx: sent   Prev Cx 9/2 R index finger: Proteus mirabilis, pan-sens  Thank you for allowing pharmacy to be a part of this patient's care.  Minda Ditto PharmD 05/26/2020 9:37 AM

## 2020-05-26 NOTE — ED Triage Notes (Signed)
Patient brought in by [facility] from home. Patient was on the ground for 8 hours at least. Patient complaining of fall. Patient has a laceration on the right lower arm. Patient is also complaining of neck and back pain. Fistula on right arm.

## 2020-05-26 NOTE — ED Notes (Signed)
Dr. Neysa Bonito paged about pt's troponin of 226.

## 2020-05-26 NOTE — ED Notes (Signed)
Merle RN is aware to make sure admitting MD is aware of Trop 226. Verbalizes understanding.

## 2020-05-26 NOTE — H&P (Addendum)
History and Physical        Hospital Admission Note Date: 05/26/2020  Patient name: [name] Medical record number: [id number] Date of birth: [birth year] Age: 74 y.o. Gender: male  PCP: [name], MD  Patient coming from: home Lives with: wife At baseline, ambulates: independently  Chief Complaint    Chief Complaint  Patient presents with  . 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  On arrival he was screaming and yelling uncontrollably however does stop to answer some questions per ED physician.  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 At baseline he is normally conversant and answers questions normally but wife is concerned the pain medication (Norco) he took from his hand surgery made him delirious. She denies a history of osa  ED Course: T100.27F, HR 110, RR 20, BP 224/102, SPO2 93% on room air.  Noted to be belligerent and yelling and required chemical sedation for patient and staff safety and full work-up.  Given patient's lack of cooperation and difficulty with evaluation full trauma CTs were obtained without contrast without any evidence of acute traumatic injury to chest abdomen or pelvis thoracic and lumbar spine  x-rays negative and with small pleural effusions bilaterally without respiratory distress or hypoxia.  Notable labs: Na 149, K5.4, chloride 122, CO2 12, glucose 152, BUN 74, creatinine 2.71, AG 15, AST 63, ALT 14, T bili 1.4, CK 1566, troponin I82, WBC 12.0, Hb 9.8, platelets 111, INR 1.5, UA negative, UDS positive for opiates.  He received cefepime, vancomycin, 1 L NS bolus,, labetalol 20 mg x 1, Geodon milligrams x1.  Vitals:   05/26/20 0800 05/26/20 0900  BP: (!) 154/74 (!) 161/69  Pulse: 75 74  Resp: 17 (!) 23  Temp:    SpO2: 90% 91%     Review of Systems:  Review of Systems  Unable to perform ROS: Mental status change    Medical/Social/Family History   Past Medical History: Past Medical History:  Diagnosis Date  . Anemia   . Atrial fibrillation ([place])   . CHF (congestive heart failure) ([place])   . Chronic kidney disease   . Diabetes mellitus   . Dysrhythmia   . Hyperlipidemia   . Hypertension   . Neuropathy   . Wound of left leg 08/2018    Past Surgical History:  Procedure Laterality Date  . ABDOMINAL AORTOGRAM W/LOWER EXTREMITY N/A 10/12/2019   Procedure: ABDOMINAL AORTOGRAM W/LOWER EXTREMITY;  Surgeon: Angelia Mould, MD;  Location: [facility];  Service: Cardiovascular;  Laterality: N/A;  With CO2  .  access proceds    . achlles tendon repair    . AMPUTATION Right 05/22/2020   Procedure: RIGHT INDEX FINGER DEBRIDEMENT AND AMPUTATION;  Surgeon: Leanora Cover, MD;  Location: [facility];  Service: Orthopedics;  Laterality: Right;  . ARTERIOVENOUS GRAFT PLACEMENT    . bowel repla     repair  . ENDOVENOUS ABLATION SAPHENOUS VEIN W/ LASER Right 10/04/2019   endovenous laser ablation right greater saphenous vein by Gae Gallop MD   . [place] and 2005  . LIGATION GORETEX GRAFT  06/30/11   Right upper arm    Medications: Prior to Admission medications   Medication Sig Start Date End Date Taking? Authorizing Provider    acetaminophen (TYLENOL) 500 MG tablet Take 1,000 mg by mouth every 6 (six) hours as needed for mild pain or moderate pain.   Yes [provider]  aspirin 81 MG tablet Take 162 mg by mouth 2 (two) times daily.    Yes [provider]  atorvastatin (LIPITOR) 10 MG tablet Take 10 mg by mouth daily.   Yes [provider]  calcitRIOL (ROCALTROL) 0.25 MCG capsule Take 0.25 mcg by mouth daily. daily 07/22/15  Yes [provider]  carvedilol (COREG) 6.25 MG tablet Take 1 tablet (6.25 mg total) by mouth 2 (two) times daily with a meal. 01/29/20  Yes Burtis Junes, NP  diphenhydrAMINE (BENADRYL) 25 mg capsule Take 1 capsule (25 mg total) by mouth at bedtime as needed (for sleep/allergies.). 09/11/18  Yes Regalado, Belkys A, MD  furosemide (LASIX) 40 MG tablet Take 80 mg by mouth daily. Fluid. 04/25/17  Yes Belva Crome, MD  gabapentin (NEURONTIN) 100 MG capsule Take 100 mg by mouth at bedtime. Take three times/day as needed   Yes [provider]  hydrALAZINE (APRESOLINE) 25 MG tablet Take 25 mg by mouth 3 (three) times daily.  08/22/18  Yes [provider]  icosapent Ethyl (VASCEPA) 1 g capsule Take 2 g by mouth 2 (two) times daily.   Yes [provider]  linagliptin (TRADJENTA) 5 MG TABS tablet Take 5 mg by mouth daily.     Yes [provider]  mycophenolate (CELLCEPT) 500 MG tablet Take 500 mg by mouth 2 (two) times daily.    Yes [provider]  oxyCODONE-acetaminophen (PERCOCET/ROXICET) 5-325 MG tablet Take 1-2 tablets by mouth every 6 (six) hours as needed. 05/23/20  Yes [provider]  predniSONE (DELTASONE) 5 MG tablet Take 5 mg by mouth daily. 07/07/18  Yes [provider]  tacrolimus (PROGRAF) 1 MG capsule Take 1 mg by mouth 2 (two) times daily.    Yes [provider]  Alcohol Swabs (B-D SINGLE USE SWABS REGULAR) PADS Use as directed 09/07/15   [provider]  BAYER CONTOUR NEXT TEST  test strip Use as directed 09/12/15   [provider]  BD PEN NEEDLE NANO U/F 32G X 4 MM MISC Use as directed 09/25/15   [provider]  cephALEXin (KEFLEX) 500 MG capsule Take 500 mg by mouth 2 (two) times daily. 04/01/20   [provider]  HUMALOG KWIKPEN 100 UNIT/ML KwikPen Inject 3-5 Units into the skin 3 (three) times daily. 07/17/18   [provider]  HYDROcodone-acetaminophen (NORCO) 5-325 MG tablet 1-2 tabs po q6 hours prn pain 05/22/20   Leanora Cover, MD  LANTUS SOLOSTAR 100 UNIT/ML Solostar Pen Inject 7-15 Units into the skin daily. If blood sugars are >300, use 15  units, <300 use 7 units 06/04/14   [provider]  latanoprost (XALATAN) 0.005 % ophthalmic solution Place 1 drop into both eyes at bedtime. 07/28/18   [provider]  losartan (COZAAR) 25 MG tablet Take 12.5 mg by mouth daily. 04/01/20   [provider]  MICROLET LANCETS MISC Use as directed 07/14/15   [provider]  Omega-3 Fatty Acids (FISH OIL) 1000 MG CAPS Take by mouth.    [provider]    Allergies:   Allergies  Allergen Reactions  . Elavil [Amitriptyline Hcl] Other (See Comments)    Unknown felt as if he was tripping    Social History:  reports that he has never smoked. He has never used smokeless tobacco. He reports that he does not drink alcohol and does not use drugs.  Family History: Family History  Problem Relation Age of Onset  . Heart disease Mother   . Heart attack Father   . Stroke Sister      Objective   Physical Exam: Blood pressure (!) 161/69, pulse 74, temperature (!) 100.6 F (38.1 C), temperature source Rectal, resp. rate (!) 23, height 5\' 9"  (1.753 m), weight 88.9 kg, SpO2 91 %.  Physical Exam Vitals and nursing note reviewed. Exam conducted with a chaperone present.  Constitutional:      Appearance: He is obese.     Comments: Obtunded Protecting airway  HENT:     Head: Normocephalic.     Comments:  Dried blood around mouth    Mouth/Throat:     Mouth: Mucous membranes are dry.  Eyes:     General: No scleral icterus.    Conjunctiva/sclera: Conjunctivae normal.     Pupils: Pupils are equal, round, and reactive to light.  Cardiovascular:     Rate and Rhythm: Normal rate and regular rhythm.  Pulmonary:     Effort: No respiratory distress.     Comments: Snoring with some suspected apneic episodes Abdominal:     General: There is no distension.  Musculoskeletal:        General: No swelling or tenderness.  Skin:    Coloration: Skin is not jaundiced or pale.  Neurological:     Comments: Pulls away to painful stimuli      LABS on Admission: I have personally reviewed all the labs and imaging below    Basic Metabolic Panel: Recent Labs  Lab 05/19/20 1130 05/26/20 0441  NA 143 149*  K 5.2* 5.4*  CL 118* 122*  CO2 14* 12*  GLUCOSE 66* 152*  BUN 83* 74*  CREATININE 3.33* 2.71*  CALCIUM 9.4 9.7   Liver Function Tests: Recent Labs  Lab 05/26/20 0441  AST 63*  ALT 14  ALKPHOS 71  BILITOT 1.4*  PROT 7.0  ALBUMIN 3.9   Recent Labs  Lab 05/26/20 0441  LIPASE 26   Recent Labs  Lab 05/26/20 0443  AMMONIA 27   CBC: Recent Labs  Lab 05/19/20 1130 05/19/20 1130 05/26/20 0441  WBC 6.0  --  12.0*  HGB 8.5*  --  9.8*  HCT 30.5*  --  34.2*  MCV 98.7   < > 100.3*  PLT 89*  --  111*   < > = values in this interval not displayed.   Cardiac Enzymes: Recent Labs  Lab 05/26/20 0441  CKTOTAL 1,566*   BNP: Invalid input(s): POCBNP CBG: Recent Labs  Lab 05/22/20 1005 05/26/20 0823  GLUCAP 211* 134*    Radiological Exams on Admission:  CT ABDOMEN  PELVIS WO CONTRAST  Result Date: 05/26/2020 CLINICAL DATA:  Fall, found down EXAM: CT CHEST, ABDOMEN AND PELVIS WITHOUT CONTRAST TECHNIQUE: Multidetector CT imaging of the chest, abdomen and pelvis was performed following the standard protocol without IV contrast. COMPARISON:  None. FINDINGS: Motion degraded images.  CT CHEST FINDINGS Cardiovascular: Cardiomegaly. No pericardial effusion. Chronic pericardial calcifications. No evidence of thoracic aortic aneurysm. Atherosclerotic calcifications of the aortic arch. Enlargement of the main pulmonary artery, suggesting pulmonary arterial hypertension. Three vessel coronary atherosclerosis. Mediastinum/Nodes: No suspicious mediastinal lymphadenopathy. Visualized thyroid is unremarkable. Lungs/Pleura: Evaluation of the lung parenchyma is constrained by respiratory motion. Within that constraint, there are no suspicious pulmonary nodules. Small right and trace left pleural effusions. Mild right basilar atelectasis.  No focal consolidation. No pneumothorax. Musculoskeletal: No acute fracture is seen. Specifically, the sternum, clavicles, and scapulae are intact. Ribs are poorly evaluated due to motion degradation. Old left rib fracture deformities without definite acute fracture. Dedicated thoracic spine evaluation will be reported separately. CT ABDOMEN PELVIS FINDINGS Hepatobiliary: Unenhanced liver is grossly unremarkable. No perihepatic fluid/hemorrhage. Suspected layering gallbladder sludge (series 2/image 25), without associated inflammatory changes. No intrahepatic or extrahepatic ductal dilatation. Pancreas: Within normal limits. Spleen: Within normal limits.  No perisplenic fluid/hemorrhage. Adrenals/Urinary Tract: Adrenal glands are within normal limits. Severe bilateral renal cortical atrophy with a dominant 3.6 cm right upper pole renal cyst. No hydronephrosis. Calcified, amorphous right lower quadrant renal transplant with sequela of rejection (series 2/image 54). Left lower quadrant renal transplant without hydronephrosis. Bladder is within normal limits. Stomach/Bowel: Stomach is within normal limits. No evidence of bowel obstruction. Normal appendix (series 2/image 22). Extensive left colonic diverticulosis, without evidence of diverticulitis. Vascular/Lymphatic: No  evidence of abdominal aortic aneurysm. Extensive atherosclerotic calcifications the abdominal aorta and branch vessels. No suspicious abdominopelvic lymphadenopathy. Reproductive: Prostate is grossly unremarkable. Other: No abdominopelvic ascites. No hemoperitoneum or free air. Musculoskeletal: No fracture is seen. Specifically, the visualized bony pelvis and proximal femurs are intact. Dedicated lumbar spine evaluation will be reported separately. IMPRESSION: Motion degraded images. No evidence of traumatic injury to the chest, abdomen, or pelvis. Dedicated thoracolumbar spine evaluation will be reported separately. Small right and trace left pleural effusions. Additional chronic ancillary findings as above. Electronically Signed   By: Julian Hy M.D.   On: 05/26/2020 06:41   DG Chest 1 View  Result Date: 05/26/2020 CLINICAL DATA:  Altered mental status EXAM: CHEST  1 VIEW COMPARISON:  09/06/2018 FINDINGS: Moderate cardiomegaly. Hazy opacities in the right lung may be exaggerated by rotation. Right pleural effusion. IMPRESSION: Right pleural effusion and moderate cardiomegaly. Electronically Signed   By: Ulyses Jarred M.D.   On: 05/26/2020 06:48   DG Pelvis 1-2 Views  Result Date: 05/26/2020 CLINICAL DATA:  Fall found down EXAM: PELVIS - 1-2 VIEW COMPARISON:  None. FINDINGS: There is no evidence of pelvic fracture or diastasis. No pelvic bone lesions are seen. IMPRESSION: Negative. Electronically Signed   By: Ulyses Jarred M.D.   On: 05/26/2020 06:49   DG Forearm Right  Result Date: 05/26/2020 CLINICAL DATA:  Right forearm injury after fall. EXAM: RIGHT FOREARM - 2 VIEW COMPARISON:  None. FINDINGS: There is no evidence of fracture or other focal bone lesions. Vascular calcifications are noted. Postsurgical changes are noted in the antecubital region. IMPRESSION: Negative. Electronically Signed   By: Marijo Conception M.D.   On: 05/26/2020 09:15   CT Head Wo Contrast  Result Date:  05/26/2020 CLINICAL DATA:  Fall EXAM: CT HEAD WITHOUT CONTRAST  CT CERVICAL SPINE WITHOUT CONTRAST TECHNIQUE: Multidetector CT imaging of the head and cervical spine was performed following the standard protocol without intravenous contrast. Multiplanar CT image reconstructions of the cervical spine were also generated. COMPARISON:  None. FINDINGS: CT HEAD FINDINGS Brain: There is no mass, hemorrhage or extra-axial collection. The size and configuration of the ventricles and extra-axial CSF spaces are normal. There is hypoattenuation of the periventricular white matter, most commonly indicating chronic ischemic microangiopathy. Vascular: Atherosclerotic calcification of the vertebral and internal carotid arteries at the skull base. No abnormal hyperdensity of the major intracranial arteries or dural venous sinuses. Skull: Small hematoma at the scalp vertex.  No skull fracture. Sinuses/Orbits: No fluid levels or advanced mucosal thickening of the visualized paranasal sinuses. No mastoid or middle ear effusion. The orbits are normal. CT CERVICAL SPINE FINDINGS Alignment: No static subluxation. Facets are aligned. Occipital condyles are normally positioned. Skull base and vertebrae: No acute fracture. Soft tissues and spinal canal: No prevertebral fluid or swelling. No visible canal hematoma. Disc levels: C5-7 degenerative disc disease. Upper chest: No pneumothorax, pulmonary nodule or pleural effusion. Other: Normal visualized paraspinal cervical soft tissues. IMPRESSION: 1. 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 2. Small scalp vertex hematoma without skull fracture. 3. No acute fracture or static subluxation of the cervical spine. Electronically Signed   By: Ulyses Jarred M.D.   On: 05/26/2020 06:44   CT Chest Wo Contrast  Result Date: 05/26/2020 CLINICAL DATA:  Fall, found down EXAM: CT CHEST, ABDOMEN AND PELVIS WITHOUT CONTRAST TECHNIQUE: Multidetector CT imaging of the chest,  abdomen and pelvis was performed following the standard protocol without IV contrast. COMPARISON:  None. FINDINGS: Motion degraded images. CT CHEST FINDINGS Cardiovascular: Cardiomegaly. No pericardial effusion. Chronic pericardial calcifications. No evidence of thoracic aortic aneurysm. Atherosclerotic calcifications of the aortic arch. Enlargement of the main pulmonary artery, suggesting pulmonary arterial hypertension. Three vessel coronary atherosclerosis. Mediastinum/Nodes: No suspicious mediastinal lymphadenopathy. Visualized thyroid is unremarkable. Lungs/Pleura: Evaluation of the lung parenchyma is constrained by respiratory motion. Within that constraint, there are no suspicious pulmonary nodules. Small right and trace left pleural effusions. Mild right basilar atelectasis.  No focal consolidation. No pneumothorax. Musculoskeletal: No acute fracture is seen. Specifically, the sternum, clavicles, and scapulae are intact. Ribs are poorly evaluated due to motion degradation. Old left rib fracture deformities without definite acute fracture. Dedicated thoracic spine evaluation will be reported separately. CT ABDOMEN PELVIS FINDINGS Hepatobiliary: Unenhanced liver is grossly unremarkable. No perihepatic fluid/hemorrhage. Suspected layering gallbladder sludge (series 2/image 25), without associated inflammatory changes. No intrahepatic or extrahepatic ductal dilatation. Pancreas: Within normal limits. Spleen: Within normal limits.  No perisplenic fluid/hemorrhage. Adrenals/Urinary Tract: Adrenal glands are within normal limits. Severe bilateral renal cortical atrophy with a dominant 3.6 cm right upper pole renal cyst. No hydronephrosis. Calcified, amorphous right lower quadrant renal transplant with sequela of rejection (series 2/image 54). Left lower quadrant renal transplant without hydronephrosis. Bladder is within normal limits. Stomach/Bowel: Stomach is within normal limits. No evidence of bowel obstruction.  Normal appendix (series 2/image 22). Extensive left colonic diverticulosis, without evidence of diverticulitis. Vascular/Lymphatic: No evidence of abdominal aortic aneurysm. Extensive atherosclerotic calcifications the abdominal aorta and branch vessels. No suspicious abdominopelvic lymphadenopathy. Reproductive: Prostate is grossly unremarkable. Other: No abdominopelvic ascites. No hemoperitoneum or free air. Musculoskeletal: No fracture is seen. Specifically, the visualized bony pelvis and proximal femurs are intact. Dedicated lumbar spine evaluation will be reported separately. IMPRESSION: Motion degraded images. No evidence of traumatic injury to the chest, abdomen,  or pelvis. Dedicated thoracolumbar spine evaluation will be reported separately. Small right and trace left pleural effusions. Additional chronic ancillary findings as above. Electronically Signed   By: Julian Hy M.D.   On: 05/26/2020 06:41   CT Cervical Spine Wo Contrast  Result Date: 05/26/2020 CLINICAL DATA:  Fall EXAM: CT HEAD WITHOUT CONTRAST CT CERVICAL SPINE WITHOUT CONTRAST TECHNIQUE: Multidetector CT imaging of the head and cervical spine was performed following the standard protocol without intravenous contrast. Multiplanar CT image reconstructions of the cervical spine were also generated. COMPARISON:  None. FINDINGS: CT HEAD FINDINGS Brain: There is no mass, hemorrhage or extra-axial collection. The size and configuration of the ventricles and extra-axial CSF spaces are normal. There is hypoattenuation of the periventricular white matter, most commonly indicating chronic ischemic microangiopathy. Vascular: Atherosclerotic calcification of the vertebral and internal carotid arteries at the skull base. No abnormal hyperdensity of the major intracranial arteries or dural venous sinuses. Skull: Small hematoma at the scalp vertex.  No skull fracture. Sinuses/Orbits: No fluid levels or advanced mucosal thickening of the visualized  paranasal sinuses. No mastoid or middle ear effusion. The orbits are normal. CT CERVICAL SPINE FINDINGS Alignment: No static subluxation. Facets are aligned. Occipital condyles are normally positioned. Skull base and vertebrae: No acute fracture. Soft tissues and spinal canal: No prevertebral fluid or swelling. No visible canal hematoma. Disc levels: C5-7 degenerative disc disease. Upper chest: No pneumothorax, pulmonary nodule or pleural effusion. Other: Normal visualized paraspinal cervical soft tissues. IMPRESSION: 1. 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 2. Small scalp vertex hematoma without skull fracture. 3. No acute fracture or static subluxation of the cervical spine. Electronically Signed   By: Ulyses Jarred M.D.   On: 05/26/2020 06:44   CT T-SPINE NO CHARGE  Result Date: 05/26/2020 CLINICAL DATA:  Fall EXAM: CT Thoracic and Lumbar spine without contrast TECHNIQUE: Multiplanar CT images of the thoracic and lumbar spine were reconstructed from contemporary CT of the Chest, Abdomen, and Pelvis CONTRAST:  No additional contrast COMPARISON:  None FINDINGS: CT THORACIC SPINE FINDINGS Alignment: Normal. Vertebrae: No acute fracture or focal pathologic process. Disc levels: No spinal canal stenosis CT LUMBAR SPINE FINDINGS Segmentation: 5 lumbar type vertebrae. Alignment: Normal. Vertebrae: No acute fracture or focal pathologic process. Paraspinal and other soft tissues: Negative. Disc levels: No spinal canal stenosis. IMPRESSION: No acute fracture or static subluxation of the thoracic or lumbar spine. Electronically Signed   By: Ulyses Jarred M.D.   On: 05/26/2020 06:35   CT L-SPINE NO CHARGE  Result Date: 05/26/2020 CLINICAL DATA:  Fall EXAM: CT Thoracic and Lumbar spine without contrast TECHNIQUE: Multiplanar CT images of the thoracic and lumbar spine were reconstructed from contemporary CT of the Chest, Abdomen, and Pelvis CONTRAST:  No additional contrast COMPARISON:   None FINDINGS: CT THORACIC SPINE FINDINGS Alignment: Normal. Vertebrae: No acute fracture or focal pathologic process. Disc levels: No spinal canal stenosis CT LUMBAR SPINE FINDINGS Segmentation: 5 lumbar type vertebrae. Alignment: Normal. Vertebrae: No acute fracture or focal pathologic process. Paraspinal and other soft tissues: Negative. Disc levels: No spinal canal stenosis. IMPRESSION: No acute fracture or static subluxation of the thoracic or lumbar spine. Electronically Signed   By: Ulyses Jarred M.D.   On: 05/26/2020 06:35   DG Hand Complete Right  Result Date: 05/26/2020 CLINICAL DATA:  Right hand pain after fall. EXAM: RIGHT HAND - COMPLETE 3+ VIEW COMPARISON:  None. FINDINGS: Status post amputation of a portion of the second middle phalanx as well  as the second distal phalanx. Vascular calcifications are noted. No other bony abnormality is noted. Joint spaces are intact. IMPRESSION: Status post amputation of a portion of second middle phalanx as well as the second distal phalanx. Electronically Signed   By: Marijo Conception M.D.   On: 05/26/2020 09:13      EKG: Independently reviewed.    A & P   Principal Problem:   Altered mental status Active Problems:   Kidney transplant status, cadaveric   Essential hypertension   Acute metabolic encephalopathy   Diabetes mellitus type 2, insulin dependent (HCC)   Sepsis (HCC)   Hypernatremia   Increased anion gap metabolic acidosis   1. Acute metabolic encephalopathy of unknown etiology, suspect multifactorial: Metabolic acidosis, suspected sepsis, hypernatremia, opiate induced, now obtunded after receiving sedating meds in the ED a. At baseline he is conversant and answers questions appropriately per wife, currently obtunded probably from Ativan and Geodon b. Was taking Norco at home for postop pain from 9/2 c. Ammonia 27, Tylenol and salicylate level negative.  TSH unremarkable d. Sodium 149, 2.9 L Free water deficit -> D5W e. ABG: pH  7.2, PCO2 26, PO2 68, bicarb 10 -> bicarb via IV with D5W f. Hold Norco g. Sepsis as below h. PCCM consulted i. NPO  2. Suspected sepsis without septic shock of unknown source a. Sepsis criteria: Leukocytosis (12.0), febrile (100.17F), tachycardic (110), tachypneic (25) altered mental status, with suspected infectious source (unknown) in an immunosuppressed patient on prednisone, tacrolimus, mycophenolate b. Recently had right index finger nonhealing wound with necrosis s/p amputation of the right index finger through the distal middle phalanx with Dr. Fredna Dow on 9/22 -possible source c. Follow-up blood cultures d. Received 1 L NS bolus and getting D5W.  Hold further NS or LR given heart failure e. Follow-up blood cultures f. Broad-spectrum antibiotics: Vancomycin, cefepime, metronidazole  3. Fall, unknown cause a. Could have been opiate induced? b. PT when able  4. Traumatic rhabdomyolysis a. On the floor for about 8 hours per wife b. CK 1566 c. fluids as above  5. Anion gap metabolic acidosis, unknown etiology a. ABG: pH 7.2, PCO2 26, PO2 68, bicarb 10  b. Lactic acid pending c. bicarb via IV with D5W d. PCCM consulted  6. Elevated troponin a. Suspect demand ischemia b. Troponin 182-> 226 c. Trend troponin d. echo given elevated BNP e. Awaiting a call back from cardio  7. CKD 4 with a history of renal transplant on immunosuppressants a. Seems at baseline renal function b. Hold immunosuppressants given NPO and infection as above c. nephro consult  8. Heart failure exacerbation, suspected diastolic a. BNP 809, Last EF 60-65% on 03/01/19 b. Received IVF in the ED and getting D5W as above c. Holding home carvedilol and lasix  d. IV lasix 40 mg IV x 1 e. Daily weights, Intake and output f. Check Echo given elevated troponin  9. Atrial fibrillation, stable a. Not on anticoagulation b. Holding carvedilol for now c. Lopressor for afib with RVR HR > 110 d. telemetry    10. Hypertensive urgency  Hypertension a. BP 224/102 resolved with labetolol b. Holding home meds  11. Hyperlipidemia a. Holding home meds  12. Diabetes a. Holding home meds b. Sliding scale  13. Obesity, suspected OSA a. Body mass index is 28.94 kg/m.  b. Snoring in the ED, has a wide neck and possible brief apneic episodes in the ED    DVT prophylaxis: heparin   Code Status: Full Code  Diet: NPO Family Communication: Admission, patients condition and plan of care including tests being ordered have been discussed with the patient who indicates understanding and agrees with the plan and Code Status. Patient's wife was updated  Disposition Plan: The appropriate patient status for this patient is INPATIENT. Inpatient status is judged to be reasonable and necessary in order to provide the required intensity of service to ensure the patient's safety. The patient's presenting symptoms, physical exam findings, and initial radiographic and laboratory data in the context of their chronic comorbidities is felt to place them at high risk for further clinical deterioration. Furthermore, it is not anticipated that the patient will be medically stable for discharge from the hospital within 2 midnights of admission. The following factors support the patient status of inpatient.   " The patient's presenting symptoms include AMS, confusion. " The worrisome physical exam findings include obtunded. " The initial radiographic and laboratory data are worrisome because of concerning for sepsis, metabolic acidosis. " The chronic co-morbidities include obesity, hypertension, immunosuppression.   * I certify that at the point of admission it is my clinical judgment that the patient will require inpatient hospital care spanning beyond 2 midnights from the point of admission due to high intensity of service, high risk for further deterioration and high frequency of surveillance required.*   Status is:  Inpatient  Remains inpatient appropriate because:Altered mental status, Ongoing diagnostic testing needed not appropriate for outpatient work up, IV treatments appropriate due to intensity of illness or inability to take PO and Inpatient level of care appropriate due to severity of illness   Dispo: The patient is from: Home              Anticipated d/c is to: TBD              Anticipated d/c date is: > 3 days              Patient currently is not medically stable to d/c.        The medical decision making on this patient was of high complexity and the patient is at high risk for clinical deterioration, therefore this is a level 3 admission.  Consultants  . PCCM . Awaiting call back from cardio  Procedures  . none  Time Spent on Admission: 80 minutes    Harold Hedge, DO [facility] Pager [phone] 05/26/2020, 10:29 AM

## 2020-05-27 ENCOUNTER — Encounter (HOSPITAL_COMMUNITY): Payer: Medicare Other

## 2020-05-27 DIAGNOSIS — T380X5A Adverse effect of glucocorticoids and synthetic analogues, initial encounter: Secondary | ICD-10-CM

## 2020-05-27 DIAGNOSIS — E1169 Type 2 diabetes mellitus with other specified complication: Secondary | ICD-10-CM

## 2020-05-27 DIAGNOSIS — R739 Hyperglycemia, unspecified: Secondary | ICD-10-CM

## 2020-05-27 DIAGNOSIS — E785 Hyperlipidemia, unspecified: Secondary | ICD-10-CM

## 2020-05-27 LAB — CBC
HCT: 29.4 % — ABNORMAL LOW (ref 39.0–52.0)
Hemoglobin: 8.6 g/dL — ABNORMAL LOW (ref 13.0–17.0)
MCH: 28.6 pg (ref 26.0–34.0)
MCHC: 29.3 g/dL — ABNORMAL LOW (ref 30.0–36.0)
MCV: 97.7 fL (ref 80.0–100.0)
Platelets: 90 10*3/uL — ABNORMAL LOW (ref 150–400)
RBC: 3.01 MIL/uL — ABNORMAL LOW (ref 4.22–5.81)
RDW: 15 % (ref 11.5–15.5)
WBC: 10.1 10*3/uL (ref 4.0–10.5)
nRBC: 0 % (ref 0.0–0.2)

## 2020-05-27 LAB — BASIC METABOLIC PANEL
Anion gap: 12 (ref 5–15)
BUN: 56 mg/dL — ABNORMAL HIGH (ref 8–23)
CO2: 20 mmol/L — ABNORMAL LOW (ref 22–32)
Calcium: 9.3 mg/dL (ref 8.9–10.3)
Chloride: 120 mmol/L — ABNORMAL HIGH (ref 98–111)
Creatinine, Ser: 2.11 mg/dL — ABNORMAL HIGH (ref 0.61–1.24)
GFR calc Af Amer: 35 mL/min — ABNORMAL LOW (ref >60)
GFR calc non Af Amer: 30 mL/min — ABNORMAL LOW (ref >60)
Glucose, Bld: 303 mg/dL — ABNORMAL HIGH (ref 70–99)
Potassium: 4.1 mmol/L (ref 3.5–5.1)
Sodium: 152 mmol/L — ABNORMAL HIGH (ref 135–145)

## 2020-05-27 LAB — MRSA PCR SCREENING: MRSA by PCR: NEGATIVE

## 2020-05-27 LAB — COMPREHENSIVE METABOLIC PANEL
ALT: 16 U/L (ref 0–44)
AST: 57 U/L — ABNORMAL HIGH (ref 15–41)
Albumin: 3.3 g/dL — ABNORMAL LOW (ref 3.5–5.0)
Alkaline Phosphatase: 59 U/L (ref 38–126)
Anion gap: 14 (ref 5–15)
BUN: 61 mg/dL — ABNORMAL HIGH (ref 8–23)
CO2: 15 mmol/L — ABNORMAL LOW (ref 22–32)
Calcium: 8.9 mg/dL (ref 8.9–10.3)
Chloride: 118 mmol/L — ABNORMAL HIGH (ref 98–111)
Creatinine, Ser: 2.43 mg/dL — ABNORMAL HIGH (ref 0.61–1.24)
GFR calc Af Amer: 30 mL/min — ABNORMAL LOW (ref >60)
GFR calc non Af Amer: 26 mL/min — ABNORMAL LOW (ref >60)
Glucose, Bld: 372 mg/dL — ABNORMAL HIGH (ref 70–99)
Potassium: 4.5 mmol/L (ref 3.5–5.1)
Sodium: 147 mmol/L — ABNORMAL HIGH (ref 135–145)
Total Bilirubin: 1.2 mg/dL (ref 0.3–1.2)
Total Protein: 5.9 g/dL — ABNORMAL LOW (ref 6.5–8.1)

## 2020-05-27 LAB — AEROBIC/ANAEROBIC CULTURE W GRAM STAIN (SURGICAL/DEEP WOUND)

## 2020-05-27 LAB — GLUCOSE, CAPILLARY
Glucose-Capillary: 352 mg/dL — ABNORMAL HIGH (ref 70–99)
Glucose-Capillary: 275 mg/dL — ABNORMAL HIGH (ref 70–99)
Glucose-Capillary: 285 mg/dL — ABNORMAL HIGH (ref 70–99)

## 2020-05-27 LAB — URINE CULTURE: Culture: <10000 — AB

## 2020-05-27 LAB — CBG MONITORING, ED: Glucose-Capillary: 322 mg/dL — ABNORMAL HIGH (ref 70–99)

## 2020-05-27 MED ORDER — INSULIN ASPART 100 UNIT/ML ~~LOC~~ SOLN
0.0000 [IU] | Freq: Every day | SUBCUTANEOUS | Status: DC
  Administered 2020-05-27: 3 [IU] via SUBCUTANEOUS
  Administered 2020-05-29: 2 [IU] via SUBCUTANEOUS
  Administered 2020-06-01: 3 [IU] via SUBCUTANEOUS
  Administered 2020-06-03 – 2020-06-04 (×2): 2 [IU] via SUBCUTANEOUS
  Administered 2020-06-05: 4 [IU] via SUBCUTANEOUS

## 2020-05-27 MED ORDER — INSULIN REGULAR(HUMAN) IN NACL 100-0.9 UT/100ML-% IV SOLN: INTRAVENOUS | Status: DC

## 2020-05-27 MED ORDER — METOPROLOL TARTRATE 5 MG/5ML IV SOLN
5.0000 mg | Freq: Four times a day (QID) | INTRAVENOUS | Status: DC
  Administered 2020-05-27 – 2020-05-28 (×4): 5 mg via INTRAVENOUS
  Filled 2020-05-27 (×3): qty 5

## 2020-05-27 MED ORDER — INSULIN GLARGINE 100 UNIT/ML ~~LOC~~ SOLN
10.0000 [IU] | Freq: Every day | SUBCUTANEOUS | Status: DC
  Administered 2020-05-27: 10 [IU] via SUBCUTANEOUS
  Filled 2020-05-27: qty 0.1

## 2020-05-27 MED ORDER — DEXTROSE 50 % IV SOLN: 0.0000 mL | INTRAVENOUS | Status: DC | PRN

## 2020-05-27 MED ORDER — HYDRALAZINE HCL 20 MG/ML IJ SOLN
10.0000 mg | Freq: Four times a day (QID) | INTRAMUSCULAR | Status: DC | PRN
  Administered 2020-05-27 – 2020-06-06 (×6): 10 mg via INTRAVENOUS
  Filled 2020-05-27 (×6): qty 1

## 2020-05-27 MED ORDER — CHLORHEXIDINE GLUCONATE CLOTH 2 % EX PADS
6.0000 | MEDICATED_PAD | Freq: Every day | CUTANEOUS | Status: DC
  Administered 2020-05-27 – 2020-06-05 (×9): 6 via TOPICAL

## 2020-05-27 MED ORDER — INSULIN ASPART 100 UNIT/ML ~~LOC~~ SOLN
0.0000 [IU] | Freq: Three times a day (TID) | SUBCUTANEOUS | Status: DC
  Administered 2020-05-28 (×2): 8 [IU] via SUBCUTANEOUS
  Administered 2020-05-28: 5 [IU] via SUBCUTANEOUS
  Administered 2020-05-29 – 2020-05-30 (×3): 2 [IU] via SUBCUTANEOUS
  Administered 2020-05-30 (×2): 3 [IU] via SUBCUTANEOUS
  Administered 2020-05-31: 8 [IU] via SUBCUTANEOUS
  Administered 2020-05-31: 3 [IU] via SUBCUTANEOUS
  Administered 2020-05-31 – 2020-06-01 (×2): 5 [IU] via SUBCUTANEOUS
  Administered 2020-06-01: 2 [IU] via SUBCUTANEOUS
  Administered 2020-06-01: 5 [IU] via SUBCUTANEOUS
  Administered 2020-06-02: 15 [IU] via SUBCUTANEOUS
  Administered 2020-06-02: 8 [IU] via SUBCUTANEOUS

## 2020-05-27 NOTE — Progress Notes (Signed)
Pt AMS. Does not answer questions appropriately. Unable to obtain admission history.

## 2020-05-27 NOTE — Progress Notes (Signed)
Inpatient Diabetes Program Recommendations  AACE/ADA: New Consensus Statement on Inpatient Glycemic Control (2015)  Target Ranges:  Prepandial:   less than 140 mg/dL      Peak postprandial:   less than 180 mg/dL (1-2 hours)      Critically ill patients:  140 - 180 mg/dL   Lab Results  Component Value Date   GLUCAP 322 (H) 05/27/2020   HGBA1C 6.6 (H) 05/26/2020    Review of Glycemic Control Results for KAMAURY, CUTBIRTH (MRN [id number]) as of 05/27/2020 11:33  Ref. Range 05/26/2020 08:23 05/26/2020 11:22 05/26/2020 17:20 05/26/2020 22:19 05/27/2020 08:11  Glucose-Capillary Latest Ref Range: 70 - 99 mg/dL 134 (H) 167 (H) 234 (H) 222 (H) 322 (H)   Diabetes history: DM 2 Outpatient Diabetes medications: Lantus 15 units for glucose >300, <300 7 units, Humalog 3-5 units tid, Tradjenta 5mg  Daily Current orders for Inpatient glycemic control:  Novolog 0-9 units tid  Solucortef 50 mg Q8 hours  Inpatient Diabetes Program Recommendations:    Glucose 322 this am. Pt takes basal insulin at home.  -  Consider adding Levemir 10 units -  Add Novolog hs scale  Thanks,  Tama Headings RN, MSN, BC-ADM Inpatient Diabetes Coordinator Team Pager [phone] (8a-5p)

## 2020-05-27 NOTE — Progress Notes (Addendum)
   NAME:  [name], MRN:  [id number], DOB:  [birth year], LOS: 1 ADMISSION DATE:  05/26/2020, CONSULTATION DATE: 05/26/2020 REFERRING MD: Dr. 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  This was completed on May 22, 2020.  Patient was discharged home.  Tissue culture revealed rare gram.positive cocci, pansensitive Proteus.  Readmitted with altered mental status, difficulty to arouse.  Found to be acidotic, hypotensive with positive beta hydroxybutyrate.  Due to concern for sepsis patient given IV fluids and started on antibiotics.  PCCM consulted for recommendations.  Past Medical History   Past Medical History:  Diagnosis Date  . Anemia   . Atrial fibrillation ([place])   . CHF (congestive heart failure) ([place])   . Chronic kidney disease   . Diabetes mellitus   . Dysrhythmia   . Hyperlipidemia   . Hypertension   . Neuropathy   . Wound of left leg 08/2018     Significant Hospital Events   Stepdown unit admission  Consults:  Pulmonary critical care  Procedures:  05/22/2020: Right index finger amputation  Significant Diagnostic Tests:    Micro Data:  05/22/2020 finger tissue culture Proteus positive 05/26/2020: COVID-19 negative  Antimicrobials:  Cefepime Metronidazole Vancomycin  Interim history/subjective:  Has remained altered with anxiety.  Requiring frequent doses of sedating medications overnight.  Objective   Blood pressure (!) 168/82, pulse 73, temperature (!) 100.6 F (38.1 C), temperature source Rectal, resp. rate (!) 26, height 5\' 9"  (1.753 m), weight 88.9 kg, SpO2 97 %.        Intake/Output Summary (Last 24 hours) at 05/27/2020 0752 Last data filed at 05/26/2020  1805 Gross per 24 hour  Intake 482.08 ml  Output --  Net 482.08 ml   Filed Weights   05/26/20 0455  Weight: 88.9 kg    Examination: Gen:      No acute distress HEENT:  EOMI, sclera anicteric Neck:     No masses; no thyromegaly Lungs:    Clear to auscultation bilaterally; normal respiratory effort CV:         Regular rate and rhythm; no murmurs Abd:      + bowel sounds; soft, non-tender; no palpable masses, no distension Ext:    No edema; adequate peripheral perfusion Skin:      Warm and dry; no rash Neuro: Sedated, arousable.  Does not answer questions.  Resolved Hospital Problem list     Assessment & Plan:  Metabolic encephalopathy.  CT head is normal. Metabolic acidosis Elevated ketones in urine, either related to starvation ketosis, sepsis, versus potential DKA Acute metabolic encephalopathy secondary to above  UDS positive for opiates, which he was prescribed Plan: Continue gentle fluid resuscitation, sodium bicarb Follow urine output Continue antibiotics, follow cultures Avoid sedating medications as much as possible.  Chronic kidney disease, history of renal transplant Chronically immune suppressed Tacrolimus plus mycophenolate Mildly elevated CK Serum creatinine at baseline Plan: Continue hydrocortisone, tacrolimus Nephrology on board  Elevated serum troponin Likely demand ischemia, related to acidosis Plan: Continue to observe  Dispo: Stepdown admission.  Monitor respiratory status.  Best practice:  Per primary   Marshell Garfinkel MD [facility] Please see [email or web address] for pager details.  05/27/2020, 8:43 AM

## 2020-05-27 NOTE — ED Notes (Signed)
Aidin Doane, wife, would like an update on her husband, [phone].

## 2020-05-27 NOTE — Progress Notes (Signed)
Pt BP still elevated after 5 mg lopressor(1048). Was 180/78 HR 73 (1118) then 192/94 HR 78(1151). All BP's taken manually. MD notified. Awaiting new orders.

## 2020-05-27 NOTE — Progress Notes (Addendum)
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  Patient apparently awake but confused, complaining of right-sided neck and back pain. 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 Tissue culture revealed rare gram.positive cocci, pansensitive Proteus.  Patient was given Norco for pain control.  Approximately 8 hours prior to admission to the ED patient was difficult to arouse. At baseline he is normally conversant and answers questions normally but wife is concerned the pain medication (Norco) he took from his hand surgery made him delirious. ED Course: T100.37F, HR 110, RR 20, BP 224/102, SPO2 93% on room air.  Noted to be confused/belligerent and yelling,required chemical sedation Na 149, K5.4, bicarb 12, glucose 152, BUN 74, creatinine 2.71, AG 15, AST 63, ALT 14, T bili 1.4, CK 1566, troponin 182->226, EKG normal sinus rhythm with nonspecific ST-T changes in antero-lateral leads with PVCs.  QTC 421 ms.  WBC 12.0, Hb 9.8, platelets 111, INR 1.5, UA negative, UDS positive for opiates.Trauma work-up in the ED (CT chest abdomen pelvis, thoracic/lumbar spine x-rays) negative except for small pleural effusions bilaterally.He received cefepime, vancomycin, 1 L NS bolus,, labetalol 20 mg x 1, Geodon milligrams x1. Hospital course: Patient admitted to [facility] for further evaluation and management of altered mental status.  Subjective:  Patient seen by PCCM and moved to stepdown unit.  No family at bedside, he appears somewhat  lethargic with dry cracked lips, superficial skin tears and ecchymosis noted on upper and lower extremities.  Splint in place along right index finger. Was able to tell me his name.  Then went back to sleep   Objective: Vitals:   05/27/20 1220 05/27/20 1229 05/27/20 1315 05/27/20 1318  BP:   (!) 174/80   Pulse: 72 73 88   Resp: 14 14 13    Temp:    (!) 97.3 F (36.3 C)  TempSrc:      SpO2: 100% 99% 100%   Weight:      Height:        Intake/Output Summary (Last 24 hours) at 05/27/2020 1551 Last data filed at 05/27/2020 1443 Gross per 24 hour  Intake 729.58 ml  Output --  Net 729.58 ml   Filed Weights   05/26/20 0455  Weight: 88.9 kg    Physical Examination:  General: Moderately built, no acute distress noted Head ENT: Small scalp hematoma, dry cracked lips with crusted blood Heart: S1-S2 heard, regular rate and rhythm, no murmurs.  Trace leg edema noted Lungs: Equal air entry bilaterally, no rhonchi or rales on exam, no accessory muscle use Abdomen: Bowel sounds heard, soft, nontender, nondistended. No organomegaly.  No CVA tenderness Extremities/skin: Splint in place along right index finger.  Trace pedal edema.  Superficial skin tears along right leg and right arm with dressing in place.  No cyanosis or clubbing. Neurological: Lethargic, confused, minimally communicative.  Was able to tell me his name.  Moving all extremities   Data Reviewed: I have personally reviewed following labs and imaging studies  CBC: Recent Labs  Lab 05/26/20 0441 05/27/20 0410  WBC 12.0*  10.1  HGB 9.8* 8.6*  HCT 34.2* 29.4*  MCV 100.3* 97.7  PLT 111* 90*   Basic Metabolic Panel: Recent Labs  Lab 05/26/20 0441 05/26/20 0844 05/26/20 1722 05/27/20 0410  NA 149*  --  148* 147*  K 5.4*  --  4.7 4.5  CL 122*  --  122* 118*  CO2 12*  --  13* 15*  GLUCOSE 152*  --  279* 372*  BUN 74*  --  69* 61*  CREATININE 2.71*  --  2.47* 2.43*  CALCIUM 9.7  --  8.7* 8.9  MG  --  2.2  --   --     GFR: Estimated Creatinine Clearance: 30.3 mL/min (A) (by C-G formula based on SCr of 2.43 mg/dL (H)). Liver Function Tests: Recent Labs  Lab 05/26/20 0441 05/27/20 0410  AST 63* 57*  ALT 14 16  ALKPHOS 71 59  BILITOT 1.4* 1.2  PROT 7.0 5.9*  ALBUMIN 3.9 3.3*   Recent Labs  Lab 05/26/20 0441  LIPASE 26   Recent Labs  Lab 05/26/20 0443  AMMONIA 27   Coagulation Profile: Recent Labs  Lab 05/26/20 0441  INR 1.5*   Cardiac Enzymes: Recent Labs  Lab 05/26/20 0441  CKTOTAL 1,566*   BNP (last 3 results) No results for input(s): PROBNP in the last 8760 hours. HbA1C: Recent Labs    05/26/20 1057  HGBA1C 6.6*   CBG: Recent Labs  Lab 05/26/20 1122 05/26/20 1720 05/26/20 2219 05/27/20 0811 05/27/20 1157  GLUCAP 167* 234* 222* 322* 352*   Lipid Profile: No results for input(s): CHOL, HDL, LDLCALC, TRIG, CHOLHDL, LDLDIRECT in the last 72 hours. Thyroid Function Tests: Recent Labs    05/26/20 0443  TSH 0.773   Anemia Panel: No results for input(s): VITAMINB12, FOLATE, FERRITIN, TIBC, IRON, RETICCTPCT in the last 72 hours. Sepsis Labs: No results for input(s): PROCALCITON, LATICACIDVEN in the last 168 hours.  Recent Results (from the past 240 hour(s))  SARS CORONAVIRUS 2 (TAT 6-24 HRS) Nasopharyngeal Nasopharyngeal Swab     Status: None   Collection Time: 05/19/20 10:40 AM   Specimen: Nasopharyngeal Swab  Result Value Ref Range Status   SARS Coronavirus 2 NEGATIVE NEGATIVE Final    Comment: (NOTE) SARS-CoV-2 target nucleic acids are NOT DETECTED.  The SARS-CoV-2 RNA is generally detectable in upper and lower respiratory specimens during the acute phase of infection. Negative results do not preclude SARS-CoV-2 infection, do not rule out co-infections with other pathogens, and should not be used as the sole basis for treatment or other patient management decisions. Negative results must be combined with clinical observations, patient history, and  epidemiological information. The expected result is Negative.  Fact Sheet for Patients: SugarRoll.be  Fact Sheet for Healthcare Providers: https://www.woods-mathews.com/  This test is not yet approved or cleared by the [place] FDA and  has been authorized for detection and/or diagnosis of SARS-CoV-2 by FDA under an Emergency Use Authorization (EUA). This EUA will remain  in effect (meaning this test can be used) for the duration of the COVID-19 declaration under Se ction 564(b)(1) of the Act, 21 U.S.C. section 360bbb-3(b)(1), unless the authorization is terminated or revoked sooner.  Performed at [facility], [place] [address], [place],  [address]   Aerobic/Anaerobic Culture (surgical/deep wound)     Status: None   Collection Time: 05/22/20  9:15 AM   Specimen: PATH Soft tissue; Wound  Result Value Ref Range Status   Specimen Description WOUND  Final  Special Requests RIGHT INDEX FINGER SPEC A  Final   Gram Stain   Final    RARE WBC PRESENT, PREDOMINANTLY PMN RARE GRAM POSITIVE COCCI    Culture   Final    ABUNDANT PROTEUS MIRABILIS NO ANAEROBES ISOLATED Performed at [facility], [place] [address], [place], [place] [address]    Report Status 05/27/2020 FINAL  Final   Organism ID, Bacteria PROTEUS MIRABILIS  Final      Susceptibility   Proteus mirabilis - MIC*    AMPICILLIN <=2 SENSITIVE Sensitive     CEFAZOLIN <=4 SENSITIVE Sensitive     CEFEPIME <=0.12 SENSITIVE Sensitive     CEFTAZIDIME <=1 SENSITIVE Sensitive     CEFTRIAXONE <=0.25 SENSITIVE Sensitive     CIPROFLOXACIN <=0.25 SENSITIVE Sensitive     GENTAMICIN <=1 SENSITIVE Sensitive     IMIPENEM 2 SENSITIVE Sensitive     TRIMETH/SULFA <=20 SENSITIVE Sensitive     AMPICILLIN/SULBACTAM <=2 SENSITIVE Sensitive     PIP/TAZO <=4 SENSITIVE Sensitive     * ABUNDANT PROTEUS MIRABILIS  Blood culture (routine x 2)     Status: None (Preliminary result)    Collection Time: 05/26/20  4:42 AM   Specimen: BLOOD  Result Value Ref Range Status   Specimen Description   Final    BLOOD RIGHT HAND Performed at [place] [address] [place], [place] [address]    Special Requests   Final    BOTTLES DRAWN AEROBIC AND ANAEROBIC Blood Culture results may not be optimal due to an inadequate volume of blood received in culture bottles Performed at [place] [address], [place], [place] [address]    Culture   Final    NO GROWTH 1 DAY Performed at [facility], [place] [address] [place], [place] [address]    Report Status PENDING  Incomplete  Urine Culture     Status: Abnormal   Collection Time: 05/26/20  4:42 AM   Specimen: Urine, Random  Result Value Ref Range Status   Specimen Description   Final    URINE, RANDOM Performed at [place] [address], [place], [place] [address]    Special Requests   Final    NONE Performed at [facility], [place] [address] [place], [place] [address]    Culture (A)  Final    <10,000 COLONIES/mL INSIGNIFICANT GROWTH Performed at [place] [address], [place],  [address]    Report Status 05/27/2020 FINAL  Final  SARS Coronavirus 2 by RT PCR (hospital order, performed in [facility] hospital lab) Nasopharyngeal Nasopharyngeal Swab     Status: None   Collection Time: 05/26/20  4:43 AM   Specimen: Nasopharyngeal Swab  Result Value Ref Range Status   SARS Coronavirus 2 NEGATIVE NEGATIVE Final    Comment: (NOTE) SARS-CoV-2 target nucleic acids are NOT DETECTED.  The SARS-CoV-2 RNA is generally detectable in upper and lower respiratory specimens during the acute phase of infection. The lowest concentration of SARS-CoV-2 viral copies this assay can detect is 250 copies / mL. A negative result does not preclude SARS-CoV-2 infection and should not be used as the sole basis for treatment or  other patient management decisions.  A negative result may occur with improper specimen collection / handling, submission of specimen other than nasopharyngeal swab, presence of viral mutation(s) within the areas targeted by this assay, and inadequate number of viral copies (<250 copies / mL).  A negative result must be combined with clinical observations, patient history, and epidemiological information.  Fact Sheet for Patients:   StrictlyIdeas.no  Fact Sheet for Healthcare Providers: BankingDealers.co.za  This test is not yet approved or  cleared by the [place] FDA and has been authorized for detection and/or diagnosis of SARS-CoV-2 by FDA under an Emergency Use Authorization (EUA).  This EUA will remain in effect (meaning this test can be used) for the duration of the COVID-19 declaration under Section 564(b)(1) of the Act, 21 U.S.C. section 360bbb-3(b)(1), unless the authorization is terminated or revoked sooner.  Performed at [facility], [place] [address], [place], [place] [address]   MRSA PCR Screening     Status: None   Collection Time: 05/27/20 10:05 AM   Specimen: Nasopharyngeal  Result Value Ref Range Status   MRSA by PCR NEGATIVE NEGATIVE Final    Comment:        The GeneXpert MRSA Assay (FDA approved for NASAL specimens only), is one component of a comprehensive MRSA colonization surveillance program. It is not intended to diagnose MRSA infection nor to guide or monitor treatment for MRSA infections. Performed at [facility] [place], [place] [address] [place], [place] [address]       Radiology Studies: CT ABDOMEN PELVIS WO CONTRAST  Result Date: 05/26/2020 CLINICAL DATA:  Fall, found down EXAM: CT CHEST, ABDOMEN AND PELVIS WITHOUT CONTRAST TECHNIQUE: Multidetector CT imaging of the chest, abdomen and pelvis was performed following the standard protocol without IV contrast.  COMPARISON:  None. FINDINGS: Motion degraded images. CT CHEST FINDINGS Cardiovascular: Cardiomegaly. No pericardial effusion. Chronic pericardial calcifications. No evidence of thoracic aortic aneurysm. Atherosclerotic calcifications of the aortic arch. Enlargement of the main pulmonary artery, suggesting pulmonary arterial hypertension. Three vessel coronary atherosclerosis. Mediastinum/Nodes: No suspicious mediastinal lymphadenopathy. Visualized thyroid is unremarkable. Lungs/Pleura: Evaluation of the lung parenchyma is constrained by respiratory motion. Within that constraint, there are no suspicious pulmonary nodules. Small right and trace left pleural effusions. Mild right basilar atelectasis.  No focal consolidation. No pneumothorax. Musculoskeletal: No acute fracture is seen. Specifically, the sternum, clavicles, and scapulae are intact. Ribs are poorly evaluated due to motion degradation. Old left rib fracture deformities without definite acute fracture. Dedicated thoracic spine evaluation will be reported separately. CT ABDOMEN PELVIS FINDINGS Hepatobiliary: Unenhanced liver is grossly unremarkable. No perihepatic fluid/hemorrhage. Suspected layering gallbladder sludge (series 2/image 25), without associated inflammatory changes. No intrahepatic or extrahepatic ductal dilatation. Pancreas: Within normal limits. Spleen: Within normal limits.  No perisplenic fluid/hemorrhage. Adrenals/Urinary Tract: Adrenal glands are within normal limits. Severe bilateral renal cortical atrophy with a dominant 3.6 cm right upper pole renal cyst. No hydronephrosis. Calcified, amorphous right lower quadrant renal transplant with sequela of rejection (series 2/image 54). Left lower quadrant renal transplant without hydronephrosis. Bladder is within normal limits. Stomach/Bowel: Stomach is within normal limits. No evidence of bowel obstruction. Normal appendix (series 2/image 22). Extensive left colonic diverticulosis, without  evidence of diverticulitis. Vascular/Lymphatic: No evidence of abdominal aortic aneurysm. Extensive atherosclerotic calcifications the abdominal aorta and branch vessels. No suspicious abdominopelvic lymphadenopathy. Reproductive: Prostate is grossly unremarkable. Other: No abdominopelvic ascites. No hemoperitoneum or free air. Musculoskeletal: No fracture is seen. Specifically, the visualized bony pelvis and proximal femurs are intact. Dedicated lumbar spine evaluation will be reported separately. IMPRESSION: Motion degraded images. No evidence of traumatic injury to the chest, abdomen, or pelvis. Dedicated thoracolumbar spine evaluation will be reported separately. Small right and trace left pleural effusions. Additional chronic ancillary findings  as above. Electronically Signed   By: Julian Hy M.D.   On: 05/26/2020 06:41   DG Chest 1 View  Result Date: 05/26/2020 CLINICAL DATA:  Altered mental status EXAM: CHEST  1 VIEW COMPARISON:  09/06/2018 FINDINGS: Moderate cardiomegaly. Hazy opacities in the right lung may be exaggerated by rotation. Right pleural effusion. IMPRESSION: Right pleural effusion and moderate cardiomegaly. Electronically Signed   By: Ulyses Jarred M.D.   On: 05/26/2020 06:48   DG Pelvis 1-2 Views  Result Date: 05/26/2020 CLINICAL DATA:  Fall found down EXAM: PELVIS - 1-2 VIEW COMPARISON:  None. FINDINGS: There is no evidence of pelvic fracture or diastasis. No pelvic bone lesions are seen. IMPRESSION: Negative. Electronically Signed   By: Ulyses Jarred M.D.   On: 05/26/2020 06:49   DG Forearm Right  Result Date: 05/26/2020 CLINICAL DATA:  Right forearm injury after fall. EXAM: RIGHT FOREARM - 2 VIEW COMPARISON:  None. FINDINGS: There is no evidence of fracture or other focal bone lesions. Vascular calcifications are noted. Postsurgical changes are noted in the antecubital region. IMPRESSION: Negative. Electronically Signed   By: Marijo Conception M.D.   On: 05/26/2020 09:15   CT  Head Wo Contrast  Result Date: 05/26/2020 CLINICAL DATA:  Fall EXAM: CT HEAD WITHOUT CONTRAST CT CERVICAL SPINE WITHOUT CONTRAST TECHNIQUE: Multidetector CT imaging of the head and cervical spine was performed following the standard protocol without intravenous contrast. Multiplanar CT image reconstructions of the cervical spine were also generated. COMPARISON:  None. FINDINGS: CT HEAD FINDINGS Brain: There is no mass, hemorrhage or extra-axial collection. The size and configuration of the ventricles and extra-axial CSF spaces are normal. There is hypoattenuation of the periventricular white matter, most commonly indicating chronic ischemic microangiopathy. Vascular: Atherosclerotic calcification of the vertebral and internal carotid arteries at the skull base. No abnormal hyperdensity of the major intracranial arteries or dural venous sinuses. Skull: Small hematoma at the scalp vertex.  No skull fracture. Sinuses/Orbits: No fluid levels or advanced mucosal thickening of the visualized paranasal sinuses. No mastoid or middle ear effusion. The orbits are normal. CT CERVICAL SPINE FINDINGS Alignment: No static subluxation. Facets are aligned. Occipital condyles are normally positioned. Skull base and vertebrae: No acute fracture. Soft tissues and spinal canal: No prevertebral fluid or swelling. No visible canal hematoma. Disc levels: C5-7 degenerative disc disease. Upper chest: No pneumothorax, pulmonary nodule or pleural effusion. Other: Normal visualized paraspinal cervical soft tissues. IMPRESSION: 1. 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 2. Small scalp vertex hematoma without skull fracture. 3. No acute fracture or static subluxation of the cervical spine. Electronically Signed   By: Ulyses Jarred M.D.   On: 05/26/2020 06:44   CT Chest Wo Contrast  Result Date: 05/26/2020 CLINICAL DATA:  Fall, found down EXAM: CT CHEST, ABDOMEN AND PELVIS WITHOUT CONTRAST TECHNIQUE:  Multidetector CT imaging of the chest, abdomen and pelvis was performed following the standard protocol without IV contrast. COMPARISON:  None. FINDINGS: Motion degraded images. CT CHEST FINDINGS Cardiovascular: Cardiomegaly. No pericardial effusion. Chronic pericardial calcifications. No evidence of thoracic aortic aneurysm. Atherosclerotic calcifications of the aortic arch. Enlargement of the main pulmonary artery, suggesting pulmonary arterial hypertension. Three vessel coronary atherosclerosis. Mediastinum/Nodes: No suspicious mediastinal lymphadenopathy. Visualized thyroid is unremarkable. Lungs/Pleura: Evaluation of the lung parenchyma is constrained by respiratory motion. Within that constraint, there are no suspicious pulmonary nodules. Small right and trace left pleural effusions. Mild right basilar atelectasis.  No focal consolidation. No pneumothorax. Musculoskeletal: No acute fracture is seen.  Specifically, the sternum, clavicles, and scapulae are intact. Ribs are poorly evaluated due to motion degradation. Old left rib fracture deformities without definite acute fracture. Dedicated thoracic spine evaluation will be reported separately. CT ABDOMEN PELVIS FINDINGS Hepatobiliary: Unenhanced liver is grossly unremarkable. No perihepatic fluid/hemorrhage. Suspected layering gallbladder sludge (series 2/image 25), without associated inflammatory changes. No intrahepatic or extrahepatic ductal dilatation. Pancreas: Within normal limits. Spleen: Within normal limits.  No perisplenic fluid/hemorrhage. Adrenals/Urinary Tract: Adrenal glands are within normal limits. Severe bilateral renal cortical atrophy with a dominant 3.6 cm right upper pole renal cyst. No hydronephrosis. Calcified, amorphous right lower quadrant renal transplant with sequela of rejection (series 2/image 54). Left lower quadrant renal transplant without hydronephrosis. Bladder is within normal limits. Stomach/Bowel: Stomach is within normal  limits. No evidence of bowel obstruction. Normal appendix (series 2/image 22). Extensive left colonic diverticulosis, without evidence of diverticulitis. Vascular/Lymphatic: No evidence of abdominal aortic aneurysm. Extensive atherosclerotic calcifications the abdominal aorta and branch vessels. No suspicious abdominopelvic lymphadenopathy. Reproductive: Prostate is grossly unremarkable. Other: No abdominopelvic ascites. No hemoperitoneum or free air. Musculoskeletal: No fracture is seen. Specifically, the visualized bony pelvis and proximal femurs are intact. Dedicated lumbar spine evaluation will be reported separately. IMPRESSION: Motion degraded images. No evidence of traumatic injury to the chest, abdomen, or pelvis. Dedicated thoracolumbar spine evaluation will be reported separately. Small right and trace left pleural effusions. Additional chronic ancillary findings as above. Electronically Signed   By: Julian Hy M.D.   On: 05/26/2020 06:41   CT Cervical Spine Wo Contrast  Result Date: 05/26/2020 CLINICAL DATA:  Fall EXAM: CT HEAD WITHOUT CONTRAST CT CERVICAL SPINE WITHOUT CONTRAST TECHNIQUE: Multidetector CT imaging of the head and cervical spine was performed following the standard protocol without intravenous contrast. Multiplanar CT image reconstructions of the cervical spine were also generated. COMPARISON:  None. FINDINGS: CT HEAD FINDINGS Brain: There is no mass, hemorrhage or extra-axial collection. The size and configuration of the ventricles and extra-axial CSF spaces are normal. There is hypoattenuation of the periventricular white matter, most commonly indicating chronic ischemic microangiopathy. Vascular: Atherosclerotic calcification of the vertebral and internal carotid arteries at the skull base. No abnormal hyperdensity of the major intracranial arteries or dural venous sinuses. Skull: Small hematoma at the scalp vertex.  No skull fracture. Sinuses/Orbits: No fluid levels or  advanced mucosal thickening of the visualized paranasal sinuses. No mastoid or middle ear effusion. The orbits are normal. CT CERVICAL SPINE FINDINGS Alignment: No static subluxation. Facets are aligned. Occipital condyles are normally positioned. Skull base and vertebrae: No acute fracture. Soft tissues and spinal canal: No prevertebral fluid or swelling. No visible canal hematoma. Disc levels: C5-7 degenerative disc disease. Upper chest: No pneumothorax, pulmonary nodule or pleural effusion. Other: Normal visualized paraspinal cervical soft tissues. IMPRESSION: 1. 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 2. Small scalp vertex hematoma without skull fracture. 3. No acute fracture or static subluxation of the cervical spine. Electronically Signed   By: Ulyses Jarred M.D.   On: 05/26/2020 06:44   CT T-SPINE NO CHARGE  Result Date: 05/26/2020 CLINICAL DATA:  Fall EXAM: CT Thoracic and Lumbar spine without contrast TECHNIQUE: Multiplanar CT images of the thoracic and lumbar spine were reconstructed from contemporary CT of the Chest, Abdomen, and Pelvis CONTRAST:  No additional contrast COMPARISON:  None FINDINGS: CT THORACIC SPINE FINDINGS Alignment: Normal. Vertebrae: No acute fracture or focal pathologic process. Disc levels: No spinal canal stenosis CT LUMBAR SPINE FINDINGS Segmentation: 5 lumbar type vertebrae. Alignment:  Normal. Vertebrae: No acute fracture or focal pathologic process. Paraspinal and other soft tissues: Negative. Disc levels: No spinal canal stenosis. IMPRESSION: No acute fracture or static subluxation of the thoracic or lumbar spine. Electronically Signed   By: Ulyses Jarred M.D.   On: 05/26/2020 06:35   CT L-SPINE NO CHARGE  Result Date: 05/26/2020 CLINICAL DATA:  Fall EXAM: CT Thoracic and Lumbar spine without contrast TECHNIQUE: Multiplanar CT images of the thoracic and lumbar spine were reconstructed from contemporary CT of the Chest, Abdomen, and Pelvis  CONTRAST:  No additional contrast COMPARISON:  None FINDINGS: CT THORACIC SPINE FINDINGS Alignment: Normal. Vertebrae: No acute fracture or focal pathologic process. Disc levels: No spinal canal stenosis CT LUMBAR SPINE FINDINGS Segmentation: 5 lumbar type vertebrae. Alignment: Normal. Vertebrae: No acute fracture or focal pathologic process. Paraspinal and other soft tissues: Negative. Disc levels: No spinal canal stenosis. IMPRESSION: No acute fracture or static subluxation of the thoracic or lumbar spine. Electronically Signed   By: Ulyses Jarred M.D.   On: 05/26/2020 06:35   DG Hand Complete Right  Result Date: 05/26/2020 CLINICAL DATA:  Right hand pain after fall. EXAM: RIGHT HAND - COMPLETE 3+ VIEW COMPARISON:  None. 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 Vascular calcifications are noted. No other bony abnormality is noted. Joint spaces are intact. IMPRESSION: Status post amputation of a portion of second middle phalanx as well as the second distal phalanx. Electronically Signed   By: Marijo Conception M.D.   On: 05/26/2020 09:13   ECHOCARDIOGRAM COMPLETE  Result Date: 05/26/2020    ECHOCARDIOGRAM REPORT   Patient Name:   [name] Date of Exam: 05/26/2020 Medical Rec #:  [id number]       Height:       69.0 in Accession #:    [id number]      Weight:       196.0 lb Date of Birth:  05-01-1946      BSA:          2.048 m Patient Age:    27 years        BP:           161/69 mmHg Patient Gender: M               HR:           74 bpm. Exam Location:  Inpatient Procedure: 2D Echo, Cardiac Doppler and Color Doppler Indications:    CHF-Acute Diastolic 539.76 / B34.19  History:        Patient has prior history of Echocardiogram examinations, most                 recent 03/01/2019. CHF, Arrythmias:Atrial Fibrillation; Risk                 Factors:Dyslipidemia, Hypertension, Diabetes and Non-Smoker.  Sonographer:    Vickie Epley RDCS Referring  Phys: [id number] [place]  1. Left ventricular ejection fraction, by estimation, is 55 to 60%. The left ventricle has normal function. The left ventricle has no regional wall motion abnormalities. Left ventricular diastolic parameters are indeterminate. Elevated left ventricular end-diastolic pressure.  2. Right ventricular systolic function is normal. The right ventricular size is normal. Tricuspid regurgitation signal is inadequate for assessing PA pressure.  3. The mitral valve is normal in structure. No evidence of mitral valve regurgitation. No evidence of mitral stenosis.  4. The aortic valve  is tricuspid. Aortic valve regurgitation is trivial. Mild aortic valve sclerosis is present, with no evidence of aortic valve stenosis.  5. There is mild dilatation of the aortic root measuring 42 mm.  6. The inferior vena cava is dilated in size with <50% respiratory variability, suggesting right atrial pressure of 15 mmHg. FINDINGS  Left Ventricle: Left ventricular ejection fraction, by estimation, is 55 to 60%. The left ventricle has normal function. The left ventricle has no regional wall motion abnormalities. The left ventricular internal cavity size was normal in size. There is  no left ventricular hypertrophy. Left ventricular diastolic parameters are indeterminate. Elevated left ventricular end-diastolic pressure. Right Ventricle: The right ventricular size is normal. No increase in right ventricular wall thickness. Right ventricular systolic function is normal. Tricuspid regurgitation signal is inadequate for assessing PA pressure. Left Atrium: Left atrial size was normal in size. Right Atrium: Right atrial size was normal in size. Pericardium: There is no evidence of pericardial effusion. Mitral Valve: The mitral valve is normal in structure. Normal mobility of the mitral valve leaflets. Mild mitral annular calcification. No evidence of mitral valve regurgitation. No evidence of mitral valve  stenosis. Tricuspid Valve: The tricuspid valve is normal in structure. Tricuspid valve regurgitation is trivial. No evidence of tricuspid stenosis. Aortic Valve: The aortic valve is tricuspid. Aortic valve regurgitation is trivial. Mild aortic valve sclerosis is present, with no evidence of aortic valve stenosis. Pulmonic Valve: The pulmonic valve was normal in structure. Pulmonic valve regurgitation is not visualized. No evidence of pulmonic stenosis. Aorta: The aortic root is normal in size and structure. There is mild dilatation of the aortic root measuring 42 mm. Venous: The inferior vena cava is dilated in size with less than 50% respiratory variability, suggesting right atrial pressure of 15 mmHg. IAS/Shunts: No atrial level shunt detected by color flow Doppler.  LEFT VENTRICLE PLAX 2D LVIDd:         4.70 cm      Diastology LVIDs:         3.30 cm      LV e' lateral:   11.10 cm/s LV PW:         1.00 cm      LV E/e' lateral: 12.1 LV IVS:        1.00 cm      LV e' medial:    7.73 cm/s LVOT diam:     2.80 cm      LV E/e' medial:  17.3 LV SV:         100 LV SV Index:   49 LVOT Area:     6.16 cm  LV Volumes (MOD) LV vol d, MOD A2C: 103.0 ml LV vol d, MOD A4C: 119.0 ml LV vol s, MOD A2C: 43.4 ml LV vol s, MOD A4C: 56.6 ml LV SV MOD A2C:     59.6 ml LV SV MOD A4C:     119.0 ml LV SV MOD BP:      61.0 ml RIGHT VENTRICLE RV S prime:     10.60 cm/s TAPSE (M-mode): 1.2 cm LEFT ATRIUM             Index       RIGHT ATRIUM           Index LA diam:        3.60 cm 1.76 cm/m  RA Area:     17.90 cm LA Vol (A2C):   69.0 ml 33.69 ml/m RA Volume:   53.10 ml  25.92 ml/m LA Vol (A4C):   54.5 ml 26.61 ml/m LA Biplane Vol: 67.1 ml 32.76 ml/m  AORTIC VALVE LVOT Vmax:   87.20 cm/s LVOT Vmean:  57.700 cm/s LVOT VTI:    0.162 m  AORTA Ao Root diam: 4.20 cm MITRAL VALVE MV Area (PHT): 3.48 cm     SHUNTS MV Decel Time: 218 msec     Systemic VTI:  0.16 m MV E velocity: 134.00 cm/s  Systemic Diam: 2.80 cm MV A velocity: 41.60 cm/s MV  E/A ratio:  3.22 Fransico Him MD Electronically signed by Fransico Him MD Signature Date/Time: 05/26/2020/12:12:24 PM    Final       Scheduled Meds: . Chlorhexidine Gluconate Cloth  6 each Topical Q0600  . heparin  5,000 Units Subcutaneous Q8H  . hydrocortisone sod succinate (SOLU-CORTEF) inj  50 mg Intravenous Q8H  . insulin aspart  0-9 Units Subcutaneous TID WC  . metoprolol tartrate  5 mg Intravenous Q6H  . sodium chloride flush  3 mL Intravenous Q12H  . tacrolimus  0.5 mg Sublingual BID   Continuous Infusions: . ceFEPime (MAXIPIME) IV 2 g (05/27/20 0533)  . metronidazole Stopped (05/27/20 1120)  . sodium bicarbonate in D5W 1000 mL infusion 125 mL/hr at 05/27/20 1058  . [START ON 05/28/2020] vancomycin         Assessment/Plan:  1.  Acute metabolic encephalopathy: Present on admission.  Likely secondary to profound dehydration and AKI.  Patient also with uncontrolled hypertension on presentation, however transient and improved blood pressure since then.  Patient has very dry appearance.  Ketones in urine possibly reflective of starvation ketosis. Urine drug screen positive for opiates which he was prescribed on last discharge. Continue IV fluids with bicarb.  Keep n.p.o. for now.  Follow-up Prograf levels.  2.  Acute metabolic acidosis: Secondary to renal failure versus sepsis.  Blood glucose on presentation 1 30-1 67, currently up to 350 due to IV steroids, dextrose fluids, n.p.o. status and home regimen held. Remains on sliding scale insulin.  Doubt DKA but may need to give insulin drip for at least 24 hours until able to eat and long-acting insulin can be resumed.  Also beta hydroxybutyrate elevated at 4.35 on presentation raising possibility of mild DKA.  Patient on IV cefepime, vancomycin and Flagyl for possible underlying infection.  T-max 100.6 yesterday in ED x1, afebrile since then.  Will continue IV cefepime/Flagyl for now, DC vancomycin as MRSA PCR negative.  Urine cultures with  less than 10,000 colony-forming units.  Follow-up blood cultures report.  Mild leukocytosis on presentation now resolved.  3.  Acute on chronic renal failure with hypernatremia, s/p renal transplant: Secondary to dehydration.  Seen by nephrology and receiving bicarb supplemented D5W.  Will add insulin drip until off dextrose fluids. Renal function slowly improving.  Home immunosuppressants held as n.p.o.  Noted that now on IV hydrocortisone.  Appreciate nephrology evaluation and follow-up.  Nephrology recommends discontinuing vancomycin if no strong suspicion for MRSA.  Will DC for now.  4. Hypertensive urgency: Patient presented with BP 224/102 in the setting of confusion and agitation.  Metoprolol changed to scheduled IV dosing as n.p.o.  SBP improved to 150s to 160s now.  Hydralazine IV as needed available.  Can consider Nitropaste or clonidine patch if blood pressure goes up again.   5.  Elevated CK: Patient with CK level~1500.  Likely secondary to falls, being on the floor.  Continue IV hydration and repeat levels in a.m.  6.  Elevated  troponin: In the setting of renal failure and possible demand ischemia from uncontrolled blood pressure.  EKG with nonspecific ST-T changes.  Echocardiogram shows preserved EF..  Cardiac monitoring in stepdown unit.  7.  Chronic diastolic CHF: Repeat echo shows preserved EF.  Appears dry as of now.  Monitor for signs of fluid overload on IV hydration.  Currently saturating well on room air.  8.  Paroxysmal atrial fibrillation: EKG on admission showed sinus rhythm.  Coreg on hold, currently on IV metoprolol.  Not on chronic anticoagulation.  9.  Diabetes mellitus: Holding home regimen.  Insulin drip as discussed above.  10.  Hyperlipidemia: Resume home medications when able to take p.o.  11.  Peripheral vascular disease: S/p amputation for right index finger necrotic changes recently by Dr. Fredna Dow.  12. Superficial skin tears on extremities: Will consult wound  care.  DVT prophylaxis: Heparin subcu Code Status: Full code Family / Patient Communication: Will call family to update Disposition Plan:   Status is: Inpatient  Remains inpatient appropriate because:Altered mental status   Dispo: The patient is from: Home              Anticipated d/c is to: To be decided              Anticipated d/c date is: > 3 days              Patient currently is not medically stable to d/c.          Time spent: 40 minutes     >50% time spent in discussions with care team and coordination of care.    Guilford Shi, MD [facility] Pager in [place]  If 7PM-7AM, please contact night-coverage [email or web address] 05/27/2020, 3:51 PM

## 2020-05-27 NOTE — Progress Notes (Signed)
Messaged MD. Waiting on BMP results before starting insulin gtt.

## 2020-05-27 NOTE — Progress Notes (Addendum)
[facility] Progress Note  Subjective: seen in room, lethargic but arouses briefly, confused  Vitals:   05/27/20 1151 05/27/20 1217 05/27/20 1220 05/27/20 1229  BP: (!) 182/84     Pulse: 74  72 73  Resp: 12  14 14   Temp:  (!) 96.7 F (35.9 C)    TempSrc:  Axillary    SpO2: 98%  100% 99%  Weight:      Height:        Exam: Gen elderly WM, confused/ sedated in ED, no distress, on RA 95% No rash, cyanosis or gangrene Sclera anicteric, throat a bit dry Bounding carotid vs jvd Chest some scattered crackles at bases RRR no MRG  Abd soft ntnd no mass or ascites +bs GU normal male MS no joint effusions or deformity Ext no LE or UE edema, has some erythema mid shin bilat, tender to touch his feet Neuro is lethargic, not responding much, sp Geodon    Home meds:  - asa 81/ lipitor 10/ coreg 6.25 bid/ lasix 80/ hydralazine 25 tid/ losartan 12.5  - pred 5/ prograf 1 bid/ mycophenolate 500 bid  - neurontin 100 hx/ percocet 1-2 qid prn  - insulin lantus 7- 15u qd/ linagliptin 5 qd/ humalog 3-5 u ac  - prn's/ vitamins/ supplements    Na 149  K 5.4  CO2 12 Cl 122  BUN 74  Cr 2.71  eGFR 22                   beta-Hba ^4.35   BNP 809  CK 1666  Trop 226, 182   WBC 12k  Hb 9.8     UA clear, 40 ket, 100 prot, 6-10 rbc, 0-5 wbc    Baseline creat 2.7- 3.3 jan - sept 2021   CXR hazy R lung poss layering effusion, no L side changes   CT chest - mod R effusion, no consolidation, no edema    Last office creat 2.0 in July 2021  Assessment/ Plan: 1. Renal transplant/ AoCKD 3b - B/l creat 2.0- 2.4 in 2021, eGFR 28- 34 ml/min. Admitted w/ AMS, creat 2.7, met acidosis, normal AG, +ketonuria and temp 100.8. Given empiric IV abx, bcx's neg at 24 hrs. Unable to take pills, cont IV hydrocortisone and SL prograf for now. Holding cellcept for now. Looks dry still, continuing IVF"s. Making urine. Creat down 2.4 today.  2. Fever/ poss sepsis - w/ fever/ ^WBC. Immunosuppressed, is getting IV  abx. Recent finger amputation. Has tender lower legs and feet, ? Gout flare. Would dc IV vanc as soon as possible if not needed given CKD IV. 3. Hypernatremia - getting hypotonic bicarb gtt, will continue. A bit better.  4. HTN - will order sched metoprolol 5 mg IV q6h in addition to prn metop, holding home BP meds d/t AMS.  When home meds resumed would hold losartan please.  5. Agitation - check prograf level, will take 2-3 more days to come back. AMS for prograf toxicity is a possibility w/ tremors, ^BP 6. IDDM - per pmd      Rob [place] 05/27/2020, 1:24 PM   Recent Labs  Lab 05/26/20 0441 05/26/20 0441 05/26/20 1722 05/27/20 0410  K 5.4*   < > 4.7 4.5  BUN 74*   < > 69* 61*  CREATININE 2.71*   < > 2.47* 2.43*  CALCIUM 9.7   < > 8.7* 8.9  HGB 9.8*  --   --  8.6*   < > =  values in this interval not displayed.   Inpatient medications: . Chlorhexidine Gluconate Cloth  6 each Topical Q0600  . heparin  5,000 Units Subcutaneous Q8H  . hydrocortisone sod succinate (SOLU-CORTEF) inj  50 mg Intravenous Q8H  . insulin aspart  0-9 Units Subcutaneous TID WC  . sodium chloride flush  3 mL Intravenous Q12H  . tacrolimus  0.5 mg Sublingual BID   . ceFEPime (MAXIPIME) IV 2 g (05/27/20 0533)  . metronidazole Stopped (05/27/20 1120)  . sodium bicarbonate in D5W 1000 mL infusion 125 mL/hr at 05/27/20 1058  . [START ON 05/28/2020] vancomycin     albuterol, hydrALAZINE, metoprolol tartrate

## 2020-05-28 ENCOUNTER — Inpatient Hospital Stay (HOSPITAL_COMMUNITY): Payer: Medicare Other

## 2020-05-28 DIAGNOSIS — Z794 Long term (current) use of insulin: Secondary | ICD-10-CM

## 2020-05-28 DIAGNOSIS — E119 Type 2 diabetes mellitus without complications: Secondary | ICD-10-CM

## 2020-05-28 DIAGNOSIS — Z940 Kidney transplant status: Secondary | ICD-10-CM

## 2020-05-28 DIAGNOSIS — E872 Acidosis: Secondary | ICD-10-CM

## 2020-05-28 LAB — CBC
HCT: 31.9 % — ABNORMAL LOW (ref 39.0–52.0)
Hemoglobin: 9.5 g/dL — ABNORMAL LOW (ref 13.0–17.0)
MCH: 28.5 pg (ref 26.0–34.0)
MCHC: 29.8 g/dL — ABNORMAL LOW (ref 30.0–36.0)
MCV: 95.8 fL (ref 80.0–100.0)
Platelets: 122 10*3/uL — ABNORMAL LOW (ref 150–400)
RBC: 3.33 MIL/uL — ABNORMAL LOW (ref 4.22–5.81)
RDW: 15.1 % (ref 11.5–15.5)
WBC: 12.5 10*3/uL — ABNORMAL HIGH (ref 4.0–10.5)
nRBC: 0 % (ref 0.0–0.2)

## 2020-05-28 LAB — GLUCOSE, CAPILLARY
Glucose-Capillary: 283 mg/dL — ABNORMAL HIGH (ref 70–99)
Glucose-Capillary: 227 mg/dL — ABNORMAL HIGH (ref 70–99)
Glucose-Capillary: 285 mg/dL — ABNORMAL HIGH (ref 70–99)
Glucose-Capillary: 193 mg/dL — ABNORMAL HIGH (ref 70–99)

## 2020-05-28 LAB — TACROLIMUS LEVEL: Tacrolimus (FK506) - LabCorp: 2.2 ng/mL (ref 2.0–20.0)

## 2020-05-28 LAB — RENAL FUNCTION PANEL
Albumin: 3.2 g/dL — ABNORMAL LOW (ref 3.5–5.0)
Anion gap: 14 (ref 5–15)
BUN: 53 mg/dL — ABNORMAL HIGH (ref 8–23)
CO2: 19 mmol/L — ABNORMAL LOW (ref 22–32)
Calcium: 9.2 mg/dL (ref 8.9–10.3)
Chloride: 119 mmol/L — ABNORMAL HIGH (ref 98–111)
Creatinine, Ser: 2.03 mg/dL — ABNORMAL HIGH (ref 0.61–1.24)
GFR calc Af Amer: 37 mL/min — ABNORMAL LOW (ref >60)
GFR calc non Af Amer: 32 mL/min — ABNORMAL LOW (ref >60)
Glucose, Bld: 313 mg/dL — ABNORMAL HIGH (ref 70–99)
Phosphorus: 2.7 mg/dL (ref 2.5–4.6)
Potassium: 4.6 mmol/L (ref 3.5–5.1)
Sodium: 152 mmol/L — ABNORMAL HIGH (ref 135–145)

## 2020-05-28 MED ORDER — ATORVASTATIN CALCIUM 10 MG PO TABS
10.0000 mg | ORAL_TABLET | Freq: Every day | ORAL | Status: DC
  Administered 2020-05-28 – 2020-06-06 (×10): 10 mg via ORAL
  Filled 2020-05-28 (×10): qty 1

## 2020-05-28 MED ORDER — CEFAZOLIN SODIUM-DEXTROSE 2-4 GM/100ML-% IV SOLN
2.0000 g | Freq: Three times a day (TID) | INTRAVENOUS | Status: DC
  Administered 2020-05-28: 2 g via INTRAVENOUS
  Filled 2020-05-28 (×3): qty 100

## 2020-05-28 MED ORDER — PREDNISONE 5 MG PO TABS
5.0000 mg | ORAL_TABLET | Freq: Every day | ORAL | Status: DC
  Administered 2020-05-29 – 2020-05-31 (×3): 5 mg via ORAL
  Filled 2020-05-28 (×4): qty 1

## 2020-05-28 MED ORDER — DEXTROSE 5 % IV SOLN: INTRAVENOUS | Status: DC

## 2020-05-28 MED ORDER — SODIUM CHLORIDE 0.9 % IV SOLN: 2.0000 g | Freq: Two times a day (BID) | INTRAVENOUS | Status: DC

## 2020-05-28 MED ORDER — SODIUM CHLORIDE 0.9 % IV SOLN
INTRAVENOUS | Status: DC | PRN
  Administered 2020-05-28 – 2020-05-31 (×2): 250 mL via INTRAVENOUS

## 2020-05-28 MED ORDER — INSULIN GLARGINE 100 UNIT/ML ~~LOC~~ SOLN
15.0000 [IU] | Freq: Every day | SUBCUTANEOUS | Status: DC
  Administered 2020-05-28 – 2020-06-01 (×5): 15 [IU] via SUBCUTANEOUS
  Filled 2020-05-28 (×5): qty 0.15

## 2020-05-28 MED ORDER — METOPROLOL TARTRATE 25 MG PO TABS
25.0000 mg | ORAL_TABLET | Freq: Two times a day (BID) | ORAL | Status: DC
  Administered 2020-05-28: 25 mg via ORAL
  Filled 2020-05-28: qty 1

## 2020-05-28 MED ORDER — METOPROLOL TARTRATE 5 MG/5ML IV SOLN: 5.0000 mg | Freq: Four times a day (QID) | INTRAVENOUS | Status: DC | PRN

## 2020-05-28 MED ORDER — NITROGLYCERIN 0.1 MG/HR TD PT24
0.1000 mg | MEDICATED_PATCH | Freq: Every day | TRANSDERMAL | Status: DC
  Administered 2020-05-28 – 2020-06-01 (×5): 0.1 mg via TRANSDERMAL
  Filled 2020-05-28 (×5): qty 1

## 2020-05-28 MED ORDER — ORAL CARE MOUTH RINSE
15.0000 mL | Freq: Two times a day (BID) | OROMUCOSAL | Status: DC
  Administered 2020-05-28 – 2020-06-06 (×16): 15 mL via OROMUCOSAL

## 2020-05-28 MED ORDER — INSULIN ASPART 100 UNIT/ML ~~LOC~~ SOLN
3.0000 [IU] | Freq: Three times a day (TID) | SUBCUTANEOUS | Status: DC
  Administered 2020-05-28 – 2020-06-01 (×7): 3 [IU] via SUBCUTANEOUS

## 2020-05-28 MED ORDER — FENTANYL CITRATE (PF) 100 MCG/2ML IJ SOLN
12.5000 ug | Freq: Once | INTRAMUSCULAR | Status: AC
  Administered 2020-05-28: 12.5 ug via INTRAVENOUS
  Filled 2020-05-28: qty 2

## 2020-05-28 MED ORDER — MYCOPHENOLATE MOFETIL 250 MG PO CAPS
500.0000 mg | ORAL_CAPSULE | Freq: Two times a day (BID) | ORAL | Status: DC
  Administered 2020-05-28 – 2020-06-06 (×19): 500 mg via ORAL
  Filled 2020-05-28 (×20): qty 2

## 2020-05-28 MED ORDER — CARVEDILOL 6.25 MG PO TABS
6.2500 mg | ORAL_TABLET | Freq: Two times a day (BID) | ORAL | Status: DC
  Administered 2020-05-28 – 2020-06-01 (×8): 6.25 mg via ORAL
  Filled 2020-05-28 (×8): qty 1

## 2020-05-28 MED ORDER — INSULIN GLARGINE 100 UNIT/ML ~~LOC~~ SOLN: 20.0000 [IU] | Freq: Every day | SUBCUTANEOUS | Status: DC

## 2020-05-28 MED ORDER — AMLODIPINE BESYLATE 5 MG PO TABS
5.0000 mg | ORAL_TABLET | Freq: Every day | ORAL | Status: DC
  Administered 2020-05-28 – 2020-05-30 (×3): 5 mg via ORAL
  Filled 2020-05-28 (×3): qty 1

## 2020-05-28 NOTE — Progress Notes (Signed)
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  Patient apparently awake but confused, complaining of right-sided neck and back pain. 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 Tissue culture revealed rare gram.positive cocci, pansensitive Proteus.  Patient was given Norco for pain control.  Approximately 8 hours prior to admission to the ED patient was difficult to arouse. At baseline he is normally conversant and answers questions normally but wife is concerned the pain medication (Norco) he took from his hand surgery made him delirious. ED Course: T100.73F, HR 110, RR 20, BP 224/102, SPO2 93% on room air.  Noted to be confused/belligerent and yelling,required chemical sedation Na 149, K5.4, bicarb 12, glucose 152, BUN 74, creatinine 2.71, AG 15, AST 63, ALT 14, T bili 1.4, CK 1566, troponin 182->226, EKG normal sinus rhythm with nonspecific ST-T changes in antero-lateral leads with PVCs.  QTC 421 ms.  WBC 12.0, Hb 9.8, platelets 111, INR 1.5, UA negative, UDS positive for opiates.Trauma work-up in the ED (CT chest abdomen pelvis, thoracic/lumbar spine x-rays) negative except for small pleural effusions bilaterally.He received cefepime, vancomycin, 1 L NS bolus,, labetalol 20 mg x 1, Geodon milligrams x1. Hospital course: Patient admitted to [facility] for further evaluation and management of altered mental status.  Subjective:  Patient more awake, alert and communicative today. Still disoriented though. Dextrose fluids  and insulin drip ordered yesterday evening but it appears that these were discontinued at some point last night and noted orders for Lantus 10 units. Seen by diabetes educator today. Labs show worsening sodium level. According to the wife patient was doing well Saturday evening, Sunday morning he seemed somewhat weak but was still ambulating, Saturday evening had sudden change in mental status and found on the floor.  Objective: Vitals:   05/28/20 1224 05/28/20 1300 05/28/20 1400 05/28/20 1500  BP: (!) 189/81 (!) 146/64 (!) 165/84 (!) 141/54  Pulse: 76 76 78 76  Resp: (!) 24 17 13 12   Temp:      TempSrc:      SpO2: 98% 94% 96% 95%  Weight:      Height:        Intake/Output Summary (Last 24 hours) at 05/28/2020 1624 Last data filed at 05/28/2020 1400 Gross per 24 hour  Intake 3318.73 ml  Output 900 ml  Net 2418.73 ml   Filed Weights   05/26/20 0455 05/28/20 0500  Weight: 88.9 kg 83.4 kg    Physical Examination:  General: Moderately built, no acute distress noted Head ENT: Small scalp hematoma, dry cracked lips with crusted blood Heart: S1-S2 heard, regular rate and rhythm, no murmurs.  Trace leg edema noted Lungs: Equal air entry bilaterally, no rhonchi or rales on exam, no accessory muscle use Abdomen: Bowel sounds heard, soft, nontender, nondistended. No organomegaly.  No CVA tenderness Extremities/skin: Splint in place along right index finger.  Trace pedal edema.  Superficial skin tears along right leg and right arm with dressing in place.  No cyanosis or clubbing. Neurological: Lethargic, confused, minimally communicative.  Was able to tell me his name.  Moving all extremities       Data Reviewed: I have personally reviewed following labs and imaging studies  CBC: Recent Labs  Lab 05/26/20 0441 05/27/20 0410 05/28/20 0302  WBC 12.0* 10.1 12.5*  HGB 9.8* 8.6* 9.5*  HCT 34.2* 29.4* 31.9*  MCV 100.3* 97.7 95.8  PLT 111* 90* 478*   Basic Metabolic Panel: Recent  Labs  Lab 05/26/20 0441 05/26/20 0844 05/26/20 1722 05/27/20 0410 05/27/20 1821 05/28/20 0302  NA 149*  --  148* 147* 152* 152*  K 5.4*  --  4.7 4.5 4.1 4.6  CL 122*  --  122* 118* 120* 119*  CO2 12*  --  13* 15* 20* 19*  GLUCOSE 152*  --  279* 372* 303* 313*  BUN 74*  --  69* 61* 56* 53*  CREATININE 2.71*  --  2.47* 2.43* 2.11* 2.03*  CALCIUM 9.7  --  8.7* 8.9 9.3 9.2  MG  --  2.2  --   --   --   --   PHOS  --   --   --   --   --  2.7   GFR: Estimated Creatinine Clearance: 32.9 mL/min (A) (by C-G formula based on SCr of 2.03 mg/dL (H)). Liver Function Tests: Recent Labs  Lab 05/26/20 0441 05/27/20 0410 05/28/20 0302  AST 63* 57*  --   ALT 14 16  --   ALKPHOS 71 59  --   BILITOT 1.4* 1.2  --   PROT 7.0 5.9*  --   ALBUMIN 3.9 3.3* 3.2*   Recent Labs  Lab 05/26/20 0441  LIPASE 26   Recent Labs  Lab 05/26/20 0443  AMMONIA 27   Coagulation Profile: Recent Labs  Lab 05/26/20 0441  INR 1.5*   Cardiac Enzymes: Recent Labs  Lab 05/26/20 0441  CKTOTAL 1,566*   BNP (last 3 results) No results for input(s): PROBNP in the last 8760 hours. HbA1C: Recent Labs    05/26/20 1057  HGBA1C 6.6*   CBG: Recent Labs  Lab 05/27/20 1157 05/27/20 1602 05/27/20 2149 05/28/20 0803 05/28/20 1151  GLUCAP 352* 275* 285* 283* 227*   Lipid Profile: No results for input(s): CHOL, HDL, LDLCALC, TRIG, CHOLHDL, LDLDIRECT in the last 72 hours. Thyroid Function Tests: Recent Labs    05/26/20 0443  TSH 0.773   Anemia Panel: No results for input(s): VITAMINB12, FOLATE, FERRITIN, TIBC, IRON, RETICCTPCT in the last 72 hours. Sepsis Labs: No results for input(s): PROCALCITON, LATICACIDVEN in the last 168 hours.  Recent Results (from the past 240 hour(s))  SARS CORONAVIRUS 2 (TAT 6-24 HRS) Nasopharyngeal Nasopharyngeal Swab     Status: None   Collection Time: 05/19/20 10:40 AM   Specimen: Nasopharyngeal Swab  Result Value Ref Range Status   SARS Coronavirus 2 NEGATIVE  NEGATIVE Final    Comment: (NOTE) SARS-CoV-2 target nucleic acids are NOT DETECTED.  The SARS-CoV-2 RNA is generally detectable in upper and lower respiratory specimens during the acute phase of infection. Negative results do not preclude SARS-CoV-2 infection, do not rule out co-infections with other pathogens, and should not be used as the sole basis for treatment or other patient management decisions. Negative results must be combined with clinical observations, patient history, and epidemiological information. The expected result is Negative.  Fact Sheet for Patients: SugarRoll.be  Fact Sheet for Healthcare Providers: https://www.woods-mathews.com/  This test is not yet approved or cleared by the [place] FDA and  has been authorized for detection and/or diagnosis of SARS-CoV-2 by  FDA under an Emergency Use Authorization (EUA). This EUA will remain  in effect (meaning this test can be used) for the duration of the COVID-19 declaration under Se ction 564(b)(1) of the Act, 21 U.S.C. section 360bbb-3(b)(1), unless the authorization is terminated or revoked sooner.  Performed at [facility], [place] [address] [place], [place] [address]   Aerobic/Anaerobic Culture (surgical/deep wound)     Status: None   Collection Time: 05/22/20  9:15 AM   Specimen: PATH Soft tissue; Wound  Result Value Ref Range Status   Specimen Description WOUND  Final   Special Requests RIGHT INDEX FINGER SPEC A  Final   Gram Stain   Final    RARE WBC PRESENT, PREDOMINANTLY PMN RARE GRAM POSITIVE COCCI    Culture   Final    ABUNDANT PROTEUS MIRABILIS NO ANAEROBES ISOLATED Performed at [facility], [address], [place], [place] [address]    Report Status 05/27/2020 FINAL  Final   Organism ID, Bacteria PROTEUS MIRABILIS  Final      Susceptibility   Proteus mirabilis - MIC*    AMPICILLIN <=2 SENSITIVE Sensitive     CEFAZOLIN <=4 SENSITIVE  Sensitive     CEFEPIME <=0.12 SENSITIVE Sensitive     CEFTAZIDIME <=1 SENSITIVE Sensitive     CEFTRIAXONE <=0.25 SENSITIVE Sensitive     CIPROFLOXACIN <=0.25 SENSITIVE Sensitive     GENTAMICIN <=1 SENSITIVE Sensitive     IMIPENEM 2 SENSITIVE Sensitive     TRIMETH/SULFA <=20 SENSITIVE Sensitive     AMPICILLIN/SULBACTAM <=2 SENSITIVE Sensitive     PIP/TAZO <=4 SENSITIVE Sensitive     * ABUNDANT PROTEUS MIRABILIS  Blood culture (routine x 2)     Status: None (Preliminary result)   Collection Time: 05/26/20  4:42 AM   Specimen: BLOOD  Result Value Ref Range Status   Specimen Description   Final    BLOOD RIGHT HAND Performed at [place] [address] [place], [place] [address]    Special Requests   Final    BOTTLES DRAWN AEROBIC AND ANAEROBIC Blood Culture results may not be optimal due to an inadequate volume of blood received in culture bottles Performed at [facility] [address], [place], [place] [address]    Culture   Final    NO GROWTH 2 DAYS Performed at [place] [address] [place], [place] [address]    Report Status PENDING  Incomplete  Urine Culture     Status: Abnormal   Collection Time: 05/26/20  4:42 AM   Specimen: Urine, Random  Result Value Ref Range Status   Specimen Description   Final    URINE, RANDOM Performed at [place] [address], [place], [place] [address]    Special Requests   Final    NONE Performed at [facility], [place] [address] [place], [place] [address]    Culture (A)  Final    <10,000 COLONIES/mL INSIGNIFICANT GROWTH Performed at [place] [address] [place], [place] [address]    Report Status 05/27/2020 FINAL  Final  SARS Coronavirus 2 by RT PCR (hospital order, performed in [facility] hospital lab) Nasopharyngeal Nasopharyngeal Swab     Status: None   Collection Time: 05/26/20  4:43 AM   Specimen: Nasopharyngeal Swab   Result Value Ref Range Status   SARS Coronavirus 2 NEGATIVE NEGATIVE Final    Comment: (NOTE) SARS-CoV-2 target nucleic acids  are NOT DETECTED.  The SARS-CoV-2 RNA is generally detectable in upper and lower respiratory specimens during the acute phase of infection. The lowest concentration of SARS-CoV-2 viral copies this assay can detect is 250 copies / mL. A negative result does not preclude SARS-CoV-2 infection and should not be used as the sole basis for treatment or other patient management decisions.  A negative result may occur with improper specimen collection / handling, submission of specimen other than nasopharyngeal swab, presence of viral mutation(s) within the areas targeted by this assay, and inadequate number of viral copies (<250 copies / mL). A negative result must be combined with clinical observations, patient history, and epidemiological information.  Fact Sheet for Patients:   StrictlyIdeas.no  Fact Sheet for Healthcare Providers: BankingDealers.co.za  This test is not yet approved or  cleared by the [place] FDA and has been authorized for detection and/or diagnosis of SARS-CoV-2 by FDA under an Emergency Use Authorization (EUA).  This EUA will remain in effect (meaning this test can be used) for the duration of the COVID-19 declaration under Section 564(b)(1) of the Act, 21 U.S.C. section 360bbb-3(b)(1), unless the authorization is terminated or revoked sooner.  Performed at [facility], [place] [address], [place], [place] [address]   MRSA PCR Screening     Status: None   Collection Time: 05/27/20 10:05 AM   Specimen: Nasopharyngeal  Result Value Ref Range Status   MRSA by PCR NEGATIVE NEGATIVE Final    Comment:        The GeneXpert MRSA Assay (FDA approved for NASAL specimens only), is one component of a comprehensive MRSA colonization surveillance program. It is not intended to  diagnose MRSA infection nor to guide or monitor treatment for MRSA infections. Performed at [facility], [place] [address] [place], [place] [address]       Radiology Studies: No results found.    Scheduled Meds: . amLODipine  5 mg Oral Daily  . atorvastatin  10 mg Oral Daily  . Chlorhexidine Gluconate Cloth  6 each Topical Q0600  . heparin  5,000 Units Subcutaneous Q8H  . insulin aspart  0-15 Units Subcutaneous TID WC  . insulin aspart  0-5 Units Subcutaneous QHS  . insulin glargine  15 Units Subcutaneous QHS  . mouth rinse  15 mL Mouth Rinse BID  . metoprolol tartrate  25 mg Oral BID  . mycophenolate  500 mg Oral BID  . nitroGLYCERIN  0.1 mg Transdermal Daily  . [START ON 05/29/2020] predniSONE  5 mg Oral Q breakfast  . sodium chloride flush  3 mL Intravenous Q12H  . tacrolimus  0.5 mg Sublingual BID   Continuous Infusions: . sodium chloride Stopped (05/28/20 1340)  .  ceFAZolin (ANCEF) IV    . dextrose         Assessment/Plan:  1.  Acute metabolic encephalopathy: Present on admission.  Likely multifactorial secondary to profound dehydration, hypernatremia, AKI and opiate use.  CT head unremarkable but will obtain MRI head to rule out stroke given wife's report of acute change in mental status.  Patient also with uncontrolled hypertension on presentation, however transient and improved blood pressure since then.  Patient has very dry appearance.  Ketones in urine possibly reflective of starvation ketosis and poor oral intake. Urine drug screen positive for opiates which he was prescribed on last discharge. Continue IV fluids, follow-up Prograf levels.  Diet as tolerated.  Requested speech therapy evaluation.  2.  Acute metabolic acidosis, possible mild  DKA: Present on admission.  Metabolic acidosis secondary to renal failure versus sepsis versus mild DKA.  Labs showed beta hydroxybutyrate elevation at 4.35 on presentation raising possibility of mild DKA.   Patient started on IV cefepime, vancomycin and Flagyl for possible underlying infection.  T-max 100.6  in ED x1, afebrile since then.   DCed vancomycin as MRSA PCR negative.  Urine cultures with less than 10,000 colony-forming units.  Blood cultures so far negative.    Mild leukocytosis on presentation now resolved.  Will narrow antibiotics further to IV cefazolin only to cover Proteus from prior wound cultures.  Lactate level, procalcitonin not done on admission-Will obtain  3.  Acute on chronic renal failure with hypernatremia, s/p renal transplant: Secondary to dehydration. Home diuretics (Lasix 80 mg twice daily) on hold. Seen by nephrology and started on bicarb supplemented D5W but discontinued overnight (? As concern for hyperglycemia) along with insulin drip. Renal function improved with creatinine down to 1.5 yesterday and stable at the same level today. However hypernatremia worsening to 152. Discussed with renal and resume dextrose fluids-hypotonic for hypernatremia-which should be continued at 75 mL/h with labs in AM. Avoid nephrotoxins and resume home medications- immunosuppressants as awake and able to take p.o.  4. Hypertensive urgency: Patient presented with BP 224/102 in the setting of confusion and agitation.  On IV metoprolol currently due to n.p.o. status, will transition to oral beta-blockers as able to take p.o. Continue Nitropatch and resume home dose of Norvasc for better blood pressure control. Hydralazine IV as needed available.    5.  Elevated CK: Patient with CK level~1500.  Likely secondary to falls, being on the floor.  Continue IV hydration and repeat levels in a.m.  6.  Elevated troponin: In the setting of renal failure and possible demand ischemia from uncontrolled blood pressure.  EKG with nonspecific ST-T changes.  Echocardiogram shows preserved EF..  Cardiac monitoring in stepdown unit.  7.  Chronic diastolic CHF: Repeat echo shows preserved EF.  Appears dry as of now.   Monitor for signs of fluid overload on IV hydration.  Currently saturating well on room air.  8.  Paroxysmal atrial fibrillation: EKG on admission showed sinus rhythm On Coreg at home-resume as able to take p.o.Marland Kitchen Marland Kitchen  Not on chronic anticoagulation.  9.  Diabetes mellitus: Holding home regimen.  Insulin drip ordered yesterday but discontinued and transition to Lantus overnight.  Since patient off IV hydrocortisone, blood glucose should improve-will increase Lantus to 15 units as patient will be on D5 water for hypernatremia.  If able to eat at least 50% of meals, will consider premeal insulin as well.  10.  Hyperlipidemia: Resume home medications when able to take p.o.  11.  Peripheral vascular disease: S/p amputation for right index finger necrotic changes recently by Dr. Fredna Dow.  12. Superficial skin tears on extremities: Wound care recommendations appreciated  DVT prophylaxis: Heparin subcu Code Status: Full code Family / Patient Communication: Discussed with wife over the phone Disposition Plan:   Status is: Inpatient  Remains inpatient appropriate because:Altered mental status   Dispo: The patient is from: Home              Anticipated d/c is to: To be decided              Anticipated d/c date is: > 3 days              Patient currently is not medically stable to d/c.  Time spent: 40 minutes     >50% time spent in discussions with care team and coordination of care.    Guilford Shi, MD [facility] Pager in [place]  If 7PM-7AM, please contact night-coverage [email or web address] 05/28/2020, 4:24 PM

## 2020-05-28 NOTE — TOC Initial Note (Signed)
Transition of Care [facility]) - Initial/Assessment Note    Patient Details  Name: [name] MRN: [id number] Date of Birth: 1945-09-27  Transition of [facility] CM/SW Contact:    Leeroy Cha, RN Phone Number: 05/28/2020, 8:50 AM  Clinical Narrative:                 Admitted for ams,on room air, iv abxx2, bun 53, creat 2.05 GFR decreased, wbc 12.5  Lives at home with wife. Following for progression and toc needs.  Expected Discharge Plan: Home/Self Care Barriers to Discharge: Continued Medical Work up   Patient Goals and CMS Choice Patient states their goals for this hospitalization and ongoing recovery are:: to go home CMS Medicare.gov Compare Post Acute Care list provided to:: Patient    Expected Discharge Plan and Services Expected Discharge Plan: Home/Self Care   Discharge Planning Services: CM Consult   Living arrangements for the past 2 months: Single Family Home                                      Prior Living Arrangements/Services Living arrangements for the past 2 months: Single Family Home Lives with:: Spouse Patient language and need for interpreter reviewed:: Yes Do you feel safe going back to the place where you live?: Yes      Need for Family Participation in Patient Care: Yes (Comment) Care giver support system in place?: Yes (comment)   Criminal Activity/Legal Involvement Pertinent to Current Situation/Hospitalization: No - Comment as needed  Activities of Daily Living Home Assistive Devices/Equipment: CBG Meter ADL Screening (condition at time of admission) Patient's cognitive ability adequate to safely complete daily activities?: No Is the patient deaf or have difficulty hearing?: No Does the patient have difficulty seeing, even when wearing glasses/contacts?: No Does the patient have difficulty concentrating, remembering, or making decisions?: Yes Patient able to express need for assistance with ADLs?: Yes Does the patient have  difficulty dressing or bathing?: Yes Independently performs ADLs?: No Communication: Independent Dressing (OT): Needs assistance Is this a change from baseline?: Change from baseline, expected to last >3 days Grooming: Needs assistance Is this a change from baseline?: Change from baseline, expected to last >3 days Feeding: Needs assistance Is this a change from baseline?: Change from baseline, expected to last >3 days Bathing: Needs assistance Is this a change from baseline?: Change from baseline, expected to last >3 days Toileting: Needs assistance Is this a change from baseline?: Change from baseline, expected to last >3days In/Out Bed: Needs assistance Is this a change from baseline?: Change from baseline, expected to last >3 days Walks in Home: Needs assistance Is this a change from baseline?: Change from baseline, expected to last >3 days Does the patient have difficulty walking or climbing stairs?: Yes Weakness of Legs: Both Weakness of Arms/Hands: None  Permission Sought/Granted                  Emotional Assessment Appearance:: Appears stated age Attitude/Demeanor/Rapport: Engaged Affect (typically observed): Calm Orientation: : Oriented to Place, Oriented to Self, Oriented to  Time, Oriented to Situation Alcohol / Substance Use: Not Applicable Psych Involvement: No (comment)  Admission diagnosis:  Back pain [M54.9] Altered mental status [R41.82] Fall [W19.XXXA] Acute encephalopathy [G93.40] Non-traumatic rhabdomyolysis [M62.82] Fever, unspecified fever cause [R50.9] AMS (altered mental status) [R41.82] Patient Active Problem List   Diagnosis Date Noted  . Altered mental status 05/26/2020  .  Sepsis ([place]) 05/26/2020  . Hypernatremia 05/26/2020  . Increased anion gap metabolic acidosis 71/85/5015  . Acute metabolic encephalopathy 86/82/5749  . Anemia due to chronic kidney disease 09/06/2018  . Diabetes mellitus type 2, insulin dependent ([place]) 09/06/2018  .  Community acquired pneumonia 09/06/2018  . Chronic diastolic heart failure ([place]) 03/25/2017  . Essential hypertension 03/25/2017  . Chronic anticoagulation 02/04/2017  . Dyspnea 02/03/2017  . 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 Kidney transplant status, cadaveric 10/15/2015  . Immunosuppressed status ([place]) 10/15/2015  . Paronychia of third finger of right hand 07/16/2011  . ESRD (end stage renal disease) ([place]) 07/16/2011   PCP:  [name], MD Pharmacy:   [id number] - [place], [place] [address] [place] [address] Phone: [phone] Fax: [phone]  [facility] [place], [place] [address] Phone: [phone] Fax: [phone]     Social Determinants of Health (SDOH) Interventions    Readmission Risk Interventions No flowsheet data found.

## 2020-05-28 NOTE — Progress Notes (Signed)
[facility] Progress Note  Subjective: seen in room, less somnolent, interacting, still confused  Vitals:   05/28/20 1100 05/28/20 1200 05/28/20 1224 05/28/20 1300  BP: (!) 154/79 (!) 198/78 (!) 189/81 (!) 146/64  Pulse: 75 74 76 76  Resp: 16 13 (!) 24 17  Temp:  97.7 F (36.5 C)    TempSrc:  Oral    SpO2: 93% 98% 98% 94%  Weight:      Height:        Exam: Gen elderly WM, no distress, frail Chest some scattered crackles at bases, mostly clear bilat RRR no MRG  Abd soft ntnd no mass or ascites +bs R hand wrapped, recent 2nd finger tip amputation Ext no leg or UE edema, much bruising, some erythema LE's Neuro interacting , awake today 1st time, confused    Home meds:  - asa 81/ lipitor 10/ coreg 6.25 bid/ lasix 80/ hydralazine 25 tid/ losartan 12.5  - pred 5/ prograf 1 bid/ mycophenolate 500 bid  - neurontin 100 hx/ percocet 1-2 qid prn  - insulin lantus 7- 15u qd/ linagliptin 5 qd/ humalog 3-5 u ac  - prn's/ vitamins/ supplements    Na 149  K 5.4  CO2 12 Cl 122  BUN 74  Cr 2.71  eGFR 22                   beta-Hba ^4.35   BNP 809  CK 1666  Trop 226, 182   WBC 12k  Hb 9.8     UA clear, 40 ket, 100 prot, 6-10 rbc, 0-5 wbc    Baseline creat 2.7- 3.3 jan - sept 2021   CXR hazy R lung poss layering effusion, no L side changes   CT chest - mod R effusion, no consolidation, no edema    Last office creat 2.0 in July 2021  Assessment/ Plan: 1. AoCKD 3b / transplant - B/l creat 2.0- 2.4 in 2021, eGFR 28- 34 ml/min. Admitted w/ creat 2.7, met acidosis, normal AG, +ketonuria, + beta-HBA, low grade temp. Got IV abx, blood cx's neg at 24 hrs. Able to take pills now, switched over the po pred and cellcept. Creat down to baseline 2.0 today, AKI resolving, due to dehydration.  2. Immunosuppression - 9/06 trough tac is low at 2.2, will cont SL at 0.$Remov'5mg'jyWsrh$  bid until swallowing reliably then resume pills at 1 mg bid. Would not ^ his dose since he prob had missed home doses  prior to admission.   3. Fever/ poss sepsis - w/ fever and slight ^WBC. Afeb now, getting IV ancef (vanc/ flagyl/ maxipime dc'd). UCx <10K cfu. BCx neg, CXR neg.  4. Hypernatremia - dc'd bicarb gtt, getting D5W at 75 /hr, agree.  5. HTN - resumed norvasc/ metop in divided doses, cont to hold losartan for now w/ resolving AKI>  6. AMS - multifact, starvation ketosis, opiate use (for finger amp, UDS + opiates), poss DKA.  Abx de-escalated, poss sepsis on IV ancef now. Improving daily.  7. IDDM - per pmd      Rob [place] 05/28/2020, 2:08 PM   Recent Labs  Lab 05/27/20 0410 05/27/20 0410 05/27/20 1821 05/28/20 0302  K 4.5   < > 4.1 4.6  BUN 61*   < > 56* 53*  CREATININE 2.43*   < > 2.11* 2.03*  CALCIUM 8.9   < > 9.3 9.2  PHOS  --   --   --  2.7  HGB 8.6*  --   --  9.5*   < > = values in this interval not displayed.   Inpatient medications: . amLODipine  5 mg Oral Daily  . atorvastatin  10 mg Oral Daily  . Chlorhexidine Gluconate Cloth  6 each Topical Q0600  . heparin  5,000 Units Subcutaneous Q8H  . insulin aspart  0-15 Units Subcutaneous TID WC  . insulin aspart  0-5 Units Subcutaneous QHS  . insulin glargine  15 Units Subcutaneous QHS  . mouth rinse  15 mL Mouth Rinse BID  . metoprolol tartrate  25 mg Oral BID  . mycophenolate  500 mg Oral BID  . nitroGLYCERIN  0.1 mg Transdermal Daily  . [START ON 05/29/2020] predniSONE  5 mg Oral Q breakfast  . sodium chloride flush  3 mL Intravenous Q12H  . tacrolimus  0.5 mg Sublingual BID   . sodium chloride Stopped (05/28/20 1340)  .  ceFAZolin (ANCEF) IV    . dextrose     sodium chloride, albuterol, hydrALAZINE, metoprolol tartrate

## 2020-05-28 NOTE — Progress Notes (Signed)
Inpatient Diabetes Program Recommendations  AACE/ADA: New Consensus Statement on Inpatient Glycemic Control (2015)  Target Ranges:  Prepandial:   less than 140 mg/dL      Peak postprandial:   less than 180 mg/dL (1-2 hours)      Critically ill patients:  140 - 180 mg/dL   Lab Results  Component Value Date   GLUCAP 283 (H) 05/28/2020   HGBA1C 6.6 (H) 05/26/2020    Review of Glycemic Control Results for [name], [name] (MRN [id number]) as of 05/28/2020 10:28  Ref. Range 05/27/2020 08:11 05/27/2020 11:57 05/27/2020 16:02 05/27/2020 21:49 05/28/2020 08:03  Glucose-Capillary Latest Ref Range: 70 - 99 mg/dL 322 (H) 352 (H) 275 (H) 285 (H) 283 (H)   Diabetes history: DM 2 Outpatient Diabetes medications: Lantus 15 units for glucose >300, <300 7 units, Humalog 3-5 units tid, Tradjenta 5mg  Daily Current orders for Inpatient glycemic control:  Lantus 10 units Novolog 0-15 units tid + hs  Solucortef 50 mg Q8 hours  Inpatient Diabetes Program Recommendations:    -  Consider increasing Lantus to 20 units   Thanks,  Tama Headings RN, MSN, BC-ADM Inpatient Diabetes Coordinator Team Pager [phone] (8a-5p)

## 2020-05-28 NOTE — Progress Notes (Signed)
Patient's one IV infiltrated/leaking. Attempted to place new IV without success. IV team consulted for IV placement. Per IV team, unable to visualize vein for placement. Patient has right arm restricted and left arm with skin tears. MD made aware. Will continue to monitor.

## 2020-05-28 NOTE — Progress Notes (Signed)
Pharmacy Antibiotic Note  [name] is a 74 y.o. male admitted on 05/26/2020 with sepsis.  Pharmacy has been consulted for Vancomycin and Cefepime dosing. 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  - vancomycin stopped 9/7  Plan: - Increase cefepime to 2 g iv q 12 hours with improvement in renal function.  - Flagyl 500 mg iv q 8 hours per MD - F/U renal function, cultures, clinical course  Height: 5\' 9"  (175.3 cm) Weight: 83.4 kg (183 lb 13.8 oz) IBW/kg (Calculated) : 70.7  Temp (24hrs), Avg:97.7 F (36.5 C), Min:96.7 F (35.9 C), Max:98.5 F (36.9 C)  Recent Labs  Lab 05/26/20 0441 05/26/20 1722 05/27/20 0410 05/27/20 1821 05/28/20 0302  WBC 12.0*  --  10.1  --  12.5*  CREATININE 2.71* 2.47* 2.43* 2.11* 2.03*    Estimated Creatinine Clearance: 32.9 mL/min (A) (by C-G formula based on SCr of 2.03 mg/dL (H)).    Allergies  Allergen Reactions  . Elavil [Amitriptyline Hcl] Other (See Comments)    Unknown felt as if he was tripping    Antimicrobials this admission: 9/6 Cefepime >>  9/6 Vancomycin >> 9/7  9/6 Flagyl >>  Dose adjustments this admission:  Microbiology results: 9/6 BCx x1set: NGTD 9/6 UCx: insignificant growth 9/7 MRSA PCR: neg  Prev Cx 9/2 R index finger: Proteus mirabilis, pan-sens  Thank you for allowing pharmacy to be a part of this patient's care.  Ulice Dash D PharmD 05/28/2020 9:50 AM

## 2020-05-28 NOTE — Consult Note (Signed)
WOC Nurse Consult Note: Reason for Consult: Skin tears sustained during fall to upper and lower extremities, upper>lower. Wound type: Traumatic Pressure Injury POA: N/A Measurement: Bedside RN to measure with first dressing change Wound bed:red, moist on upper extremities, lower extremities are red, dry Drainage (amount, consistency, odor) scant serous Periwound: ecchymotic, dry Dressing procedure/placement/frequency: I will implement features of the house skin tear protocol with an antimicrobial nonadherent dressing used as a wound contact layer (xeroform). Topper dressing will be silicone foam for atraumatic removal.  [name] nursing team will not follow, but will remain available to this patient, the nursing and medical teams.  Please re-consult if needed. Thanks, Maudie Flakes, MSN, RN, [place], Arther Abbott  Pager# [phone]

## 2020-05-28 NOTE — Progress Notes (Signed)
   NAME:  [name], MRN:  [id number], DOB:  [birth year], LOS: 2 ADMISSION DATE:  05/26/2020, CONSULTATION DATE: 05/26/2020 REFERRING MD: Dr. 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  This was completed on May 22, 2020.  Patient was discharged home.  Tissue culture revealed rare gram.positive cocci, pansensitive Proteus.  Readmitted with altered mental status, difficulty to arouse.  Found to be acidotic, hypotensive with positive beta hydroxybutyrate.  Due to concern for sepsis patient given IV fluids and started on antibiotics.  PCCM consulted for recommendations.  Past Medical History   Past Medical History:  Diagnosis Date  . Anemia   . Atrial fibrillation ([place])   . CHF (congestive heart failure) ([place])   . Chronic kidney disease   . Diabetes mellitus   . Dysrhythmia   . Hyperlipidemia   . Hypertension   . Neuropathy   . Wound of left leg 08/2018     Significant Hospital Events   Stepdown unit admission  Consults:  Pulmonary critical care  Procedures:  05/22/2020: Right index finger amputation  Significant Diagnostic Tests:    Micro Data:  05/22/2020 finger tissue culture Proteus positive 05/26/2020: COVID-19 negative  Antimicrobials:  Cefepime Metronidazole Vancomycin  Interim history/subjective:  Alert this morning. Largely oriented. A bit confused.  Objective   Blood pressure (!) 146/64, pulse 76, temperature 97.7 F (36.5 C), temperature source Oral, resp. rate 17, height 5\' 9"  (1.753 m), weight 83.4 kg, SpO2 94 %.        Intake/Output Summary (Last 24 hours) at 05/28/2020 1413 Last data filed at 05/28/2020 1300 Gross per 24 hour  Intake 3352.06 ml  Output 900  ml  Net 2452.06 ml   Filed Weights   05/26/20 0455 05/28/20 0500  Weight: 88.9 kg 83.4 kg    Examination: Gen:      No acute distress, alert HEENT:  EOMI, sclera anicteric Neck:     No masses; no thyromegaly Lungs:    Clear to auscultation bilaterally; normal respiratory effort CV:         Regular rate and rhythm; no murmurs Abd:      + bowel sounds; soft, non-tender; no palpable masses, no distension Ext:    No edema; adequate peripheral perfusion Skin:      Warm and dry; no rash Neuro: Awake, oriented, somewhat inattentive  Resolved Hospital Problem list     Assessment & Plan:  Metabolic encephalopathy likely due to metabolic acidosis from ketosis.  CT head is normal. Largely improved.  Plan: Control glucoses, oral diet  Chronic kidney disease, history of renal transplant Chronically immune suppressed Tacrolimus plus mycophenolate Mildly elevated CK Serum creatinine at baseline Plan: Per nephrology  Elevated serum troponin Likely demand ischemia, related to acidosis Plan: Continue to observe  Dispo: Stepdown admission.  Monitor respiratory status.  Best practice:  Per primary   We will sign off. Please contact us via [email or web address] with any questions or concerns.

## 2020-05-29 ENCOUNTER — Inpatient Hospital Stay (HOSPITAL_COMMUNITY): Payer: Medicare Other

## 2020-05-29 ENCOUNTER — Encounter (HOSPITAL_BASED_OUTPATIENT_CLINIC_OR_DEPARTMENT_OTHER): Payer: Medicare Other | Admitting: Internal Medicine

## 2020-05-29 DIAGNOSIS — Z452 Encounter for adjustment and management of vascular access device: Secondary | ICD-10-CM

## 2020-05-29 LAB — PROCALCITONIN: Procalcitonin: 6.03 ng/mL

## 2020-05-29 LAB — BASIC METABOLIC PANEL
Anion gap: 7 (ref 5–15)
BUN: 49 mg/dL — ABNORMAL HIGH (ref 8–23)
CO2: 24 mmol/L (ref 22–32)
Calcium: 8.9 mg/dL (ref 8.9–10.3)
Chloride: 116 mmol/L — ABNORMAL HIGH (ref 98–111)
Creatinine, Ser: 1.95 mg/dL — ABNORMAL HIGH (ref 0.61–1.24)
GFR calc Af Amer: 39 mL/min — ABNORMAL LOW (ref >60)
GFR calc non Af Amer: 33 mL/min — ABNORMAL LOW (ref >60)
Glucose, Bld: 235 mg/dL — ABNORMAL HIGH (ref 70–99)
Potassium: 4.3 mmol/L (ref 3.5–5.1)
Sodium: 147 mmol/L — ABNORMAL HIGH (ref 135–145)

## 2020-05-29 LAB — TACROLIMUS LEVEL: Tacrolimus (FK506) - LabCorp: 4.5 ng/mL (ref 2.0–20.0)

## 2020-05-29 LAB — RENAL FUNCTION PANEL
Albumin: 3 g/dL — ABNORMAL LOW (ref 3.5–5.0)
Anion gap: 7 (ref 5–15)
BUN: 54 mg/dL — ABNORMAL HIGH (ref 8–23)
CO2: 22 mmol/L (ref 22–32)
Calcium: 9 mg/dL (ref 8.9–10.3)
Chloride: 120 mmol/L — ABNORMAL HIGH (ref 98–111)
Creatinine, Ser: 2 mg/dL — ABNORMAL HIGH (ref 0.61–1.24)
GFR calc Af Amer: 38 mL/min — ABNORMAL LOW (ref >60)
GFR calc non Af Amer: 32 mL/min — ABNORMAL LOW (ref >60)
Glucose, Bld: 172 mg/dL — ABNORMAL HIGH (ref 70–99)
Phosphorus: 1.8 mg/dL — ABNORMAL LOW (ref 2.5–4.6)
Potassium: 3.6 mmol/L (ref 3.5–5.1)
Sodium: 149 mmol/L — ABNORMAL HIGH (ref 135–145)

## 2020-05-29 LAB — CREATININE, SERUM
Creatinine, Ser: 2.06 mg/dL — ABNORMAL HIGH (ref 0.61–1.24)
GFR calc Af Amer: 36 mL/min — ABNORMAL LOW (ref >60)
GFR calc non Af Amer: 31 mL/min — ABNORMAL LOW (ref >60)

## 2020-05-29 LAB — MAGNESIUM: Magnesium: 1.8 mg/dL (ref 1.7–2.4)

## 2020-05-29 LAB — CBC
HCT: 31.1 % — ABNORMAL LOW (ref 39.0–52.0)
Hemoglobin: 9.3 g/dL — ABNORMAL LOW (ref 13.0–17.0)
MCH: 28.9 pg (ref 26.0–34.0)
MCHC: 29.9 g/dL — ABNORMAL LOW (ref 30.0–36.0)
MCV: 96.6 fL (ref 80.0–100.0)
Platelets: 120 10*3/uL — ABNORMAL LOW (ref 150–400)
RBC: 3.22 MIL/uL — ABNORMAL LOW (ref 4.22–5.81)
RDW: 15.3 % (ref 11.5–15.5)
WBC: 11.8 10*3/uL — ABNORMAL HIGH (ref 4.0–10.5)
nRBC: 0 % (ref 0.0–0.2)

## 2020-05-29 LAB — LACTIC ACID, PLASMA: Lactic Acid, Venous: 1 mmol/L (ref 0.5–1.9)

## 2020-05-29 LAB — GLUCOSE, CAPILLARY
Glucose-Capillary: 133 mg/dL — ABNORMAL HIGH (ref 70–99)
Glucose-Capillary: 109 mg/dL — ABNORMAL HIGH (ref 70–99)
Glucose-Capillary: 150 mg/dL — ABNORMAL HIGH (ref 70–99)
Glucose-Capillary: 230 mg/dL — ABNORMAL HIGH (ref 70–99)

## 2020-05-29 MED ORDER — AMOXICILLIN-POT CLAVULANATE 875-125 MG PO TABS
1.0000 | ORAL_TABLET | Freq: Two times a day (BID) | ORAL | Status: AC
  Administered 2020-05-29 – 2020-06-01 (×9): 1 via ORAL
  Filled 2020-05-29 (×9): qty 1

## 2020-05-29 MED ORDER — HYDROMORPHONE HCL 1 MG/ML IJ SOLN
1.0000 mg | Freq: Once | INTRAMUSCULAR | Status: AC
  Administered 2020-05-29: 1 mg via INTRAMUSCULAR
  Filled 2020-05-29: qty 1

## 2020-05-29 MED ORDER — MORPHINE SULFATE (CONCENTRATE) 10 MG/0.5ML PO SOLN
5.0000 mg | Freq: Once | ORAL | Status: AC
  Administered 2020-05-29: 5 mg via SUBLINGUAL
  Filled 2020-05-29: qty 0.5

## 2020-05-29 MED ORDER — OXYCODONE-ACETAMINOPHEN 5-325 MG PO TABS
1.0000 | ORAL_TABLET | ORAL | Status: AC | PRN
  Administered 2020-05-29 – 2020-06-02 (×9): 1 via ORAL
  Filled 2020-05-29 (×9): qty 1

## 2020-05-29 MED ORDER — POTASSIUM & SODIUM PHOSPHATES 280-160-250 MG PO PACK
3.0000 | PACK | ORAL | Status: AC
  Administered 2020-05-29 (×2): 3 via ORAL
  Filled 2020-05-29 (×2): qty 3

## 2020-05-29 MED ORDER — TACROLIMUS 1 MG PO CAPS
1.0000 mg | ORAL_CAPSULE | Freq: Two times a day (BID) | ORAL | Status: DC
  Administered 2020-05-29 – 2020-06-06 (×16): 1 mg via ORAL
  Filled 2020-05-29 (×17): qty 1

## 2020-05-29 MED ORDER — CIPROFLOXACIN HCL 500 MG PO TABS: 500.0000 mg | ORAL_TABLET | Freq: Two times a day (BID) | ORAL | Status: DC

## 2020-05-29 MED ORDER — HYDRALAZINE HCL 10 MG PO TABS
10.0000 mg | ORAL_TABLET | Freq: Four times a day (QID) | ORAL | Status: DC | PRN
  Administered 2020-05-29 – 2020-05-30 (×2): 10 mg via ORAL
  Filled 2020-05-29 (×2): qty 1

## 2020-05-29 NOTE — Progress Notes (Signed)
Subjective:    Patient reports pain in right index finger.  Very reluctant to have it touched.  Objective: Vital signs in last 24 hours: Temp:  [97.8 F (36.6 C)-99.1 F (37.3 C)] 99.1 F (37.3 C) (09/09 1536) Pulse Rate:  [76-90] 83 (09/09 1500) Resp:  [16-25] 19 (09/09 1500) BP: (113-186)/(51-89) 155/85 (09/09 1500) SpO2:  [93 %-99 %] 96 % (09/09 1500)  Intake/Output from previous day: 09/08 0701 - 09/09 0700 In: 2713.3 [P.O.:1260; I.V.:1041.6; IV Piggyback:411.7] Out: 0  Intake/Output this shift: Total I/O In: 393.3 [P.O.:240; I.V.:153.3] Out: -   Recent Labs    05/27/20 0410 05/28/20 0302 05/29/20 0249  HGB 8.6* 9.5* 9.3*   Recent Labs    05/28/20 0302 05/29/20 0249  WBC 12.5* 11.8*  RBC 3.33* 3.22*  HCT 31.9* 31.1*  PLT 122* 120*   Recent Labs    05/28/20 0302 05/29/20 0249  NA 152* 149*  K 4.6 3.6  CL 119* 120*  CO2 19* 22  BUN 53* 54*  CREATININE 2.03* 2.00*   2.06*  GLUCOSE 313* 172*  CALCIUM 9.2 9.0   No results for input(s): LABPT, INR in the last 72 hours.  Dressing removed.  Wound clean/dry/intact.  No erythema.  No swelling in digit.  Hand is swollen distal to coban.  Able to move other digits.  Brisk capillary refill.   Assessment/Plan:    One week s/p right index finger amputation for non healing wound and necrosis.  Finger looks good.  No signs of infection in digit.  Hand is swollen from coban dressing.  Redressed wound on finger only.  May redress as necessary.  Ensure coban not placed too tight.  If bandaids work better for maintaining wound coverage they may be used instead.  Keep wound clean and dry.  Encouraged gentle motion of other digits and hand to help with swelling.  Okay to follow up after discharge.  Contact if any concerns.  Leanora Cover 05/29/2020, 5:13 PM

## 2020-05-29 NOTE — Progress Notes (Signed)
[facility] Progress Note Patient Name: Vitaliy Eisenhour DOB: 01/27/1946 MRN: [id number]   Date of Service  05/29/2020  HPI/Events of Note  Notified by pharmacy that cipro will have interactions with transplant medications. Pt was previously on ancef.  eICU Interventions  Agree with change to augmentin.     Intervention Category Minor Interventions: Other:  Elsie Lincoln 05/29/2020, 12:48 AM

## 2020-05-29 NOTE — Progress Notes (Signed)
[facility], surgery by Dr. Leanora Cover  Per daughter's request, contacted Dr. Leanora Cover from [facility] about dressing change on right finger amputation. Daughter stated that this dressing was supposed to be changed 9/8. MD stated he would stop by or have someone stop by this afternoon to assess. Daughter made aware. Will continue to monitor.

## 2020-05-29 NOTE — Procedures (Signed)
Central Venous Catheter Insertion Procedure Note  [name]  [id number]  05-04-1946  Date:05/29/20  Time:3:38 PM   Provider Performing:Pete Johnette Lekeith Kary Kos   Procedure: Insertion of Non-tunneled Central Venous [phone]) with US guidance ([id number])   Indication(s) Medication administration  Consent Risks of the procedure as well as the alternatives and risks of each were explained to the patient and/or caregiver.  Consent for the procedure was obtained and is signed in the bedside chart  Anesthesia Topical only with 1% lidocaine   Timeout Verified patient identification, verified procedure, site/side was marked, verified correct patient position, special equipment/implants available, medications/allergies/relevant history reviewed, required imaging and test results available.  Sterile Technique Maximal sterile technique including full sterile barrier drape, hand hygiene, sterile gown, sterile gloves, mask, hair covering, sterile ultrasound probe cover (if used).  Procedure Description Area of catheter insertion was cleaned with chlorhexidine and draped in sterile fashion.  With real-time ultrasound guidance a central venous catheter was placed into the left internal jugular vein. Nonpulsatile blood flow and easy flushing noted in all ports.  The catheter was sutured in place and sterile dressing applied.  Complications/Tolerance None; patient tolerated the procedure well. Chest X-ray is ordered to verify placement for internal jugular or subclavian cannulation.   Chest x-ray is not ordered for femoral cannulation.  EBL Minimal  Specimen(s) None  Erick Colace ACNP-BC [place] Pager # [phone] OR # [phone] if no answer

## 2020-05-29 NOTE — Progress Notes (Signed)
[facility] Progress Note Patient Name: [name] DOB: [birth year] MRN: [id number]   Date of Service  05/29/2020  HPI/Events of Note  Pain in the finger s/p amputation.  eICU Interventions  Percocet 5 1 tab po Q 4 hours PRN pain.        Kerry Kass Paiten Boies 05/29/2020, 9:56 PM

## 2020-05-29 NOTE — Progress Notes (Signed)
[facility] Progress Note  Subjective: doing better, taking pills, creat 2.0 stable  Vitals:   05/29/20 1200 05/29/20 1400 05/29/20 1500 05/29/20 1536  BP: (!) 164/77 (!) 176/88 (!) 155/85   Pulse: 79 81 83   Resp: 16 17 19    Temp:    99.1 F (37.3 C)  TempSrc:    Axillary  SpO2: 98% 95% 96%   Weight:      Height:        Exam: Gen elderly WM, no distress, frail Chest  mostly clear bilat RRR no MRG  Abd soft ntnd no mass or ascites +bs R hand wrapped, recent 2nd finger tip amputation Ext no leg or UE edema, much bruising, some erythema LE's Neuro interacting more, alert    Home meds:  - asa 81/ lipitor 10/ coreg 6.25 bid/ lasix 80/ hydralazine 25 tid/ losartan 12.5  - pred 5/ prograf 1 bid/ mycophenolate 500 bid  - neurontin 100 hx/ percocet 1-2 qid prn  - insulin lantus 7- 15u qd/ linagliptin 5 qd/ humalog 3-5 u ac  - prn's/ vitamins/ supplements    Na 149  K 5.4  CO2 12 Cl 122  BUN 74  Cr 2.71  eGFR 22                   beta-Hba ^4.35   BNP 809  CK 1666  Trop 226, 182   WBC 12k  Hb 9.8     UA clear, 40 ket, 100 prot, 6-10 rbc, 0-5 wbc    Baseline creat 2.7- 3.3 jan - sept 2021   CXR hazy R lung poss layering effusion, no L side changes   CT chest - mod R effusion, no consolidation, no edema    Last office creat 2.0 in July 2021  Assessment/ Plan: 1. AoCKD 3b / transplant - B/l creat 2.0- 2.4 in 2021, eGFR 28- 34 ml/min. Admitted w/ creat 2.7, met acidosis, normal AG, +ketonuria, + beta-HBA, low grade temp. Got IV abx, blood cx's neg at 24 hrs. Creat down to baseline 2.0 today, AKI resolved. Will sign off.  2. Immunosuppression - 9/06 trough tac was low at 2.2. would not Arthor Captain given prob poor intake pre hospitaliazation. Taking pills now, will change prograf to 4m bid po and cont cellcept and pred po.  3. Fever/ poss sepsis - w/ fever and slight ^WBC. Afeb now, getting IV ancef (vanc/ flagyl/ maxipime dc'd). UCx <10K cfu. BCx neg, CXR neg.   4. Hypernatremia - dc'd bicarb gtt, getting D5W at 75 /hr, agree. Na down. Per pmd.   5. HTN - resumed norvasc/ metop in divided doses, cont to hold losartan for 2-3 weeks then may resume.   6. AMS - multifact, starvation ketosis, opiate use (for finger amp, UDS + opiates), poss DKA.  Abx de-escalated, poss sepsis on IV ancef now. Improving daily.  7. IDDM - per pmd      Rob SPaisley9/05/2020, 3:56 PM   Recent Labs  Lab 05/28/20 0302 05/29/20 0249  K 4.6 3.6  BUN 53* 54*  CREATININE 2.03* 2.00*  2.06*  CALCIUM 9.2 9.0  PHOS 2.7 1.8*  HGB 9.5* 9.3*   Inpatient medications: . amLODipine  5 mg Oral Daily  . amoxicillin-clavulanate  1 tablet Oral Q12H  . atorvastatin  10 mg Oral Daily  . carvedilol  6.25 mg Oral BID WC  . Chlorhexidine Gluconate Cloth  6 each Topical Q0600  . heparin  5,000 Units Subcutaneous Q8H  .  insulin aspart  0-15 Units Subcutaneous TID WC  . insulin aspart  0-5 Units Subcutaneous QHS  . insulin aspart  3 Units Subcutaneous TID WC  . insulin glargine  15 Units Subcutaneous QHS  . mouth rinse  15 mL Mouth Rinse BID  . mycophenolate  500 mg Oral BID  . nitroGLYCERIN  0.1 mg Transdermal Daily  . potassium & sodium phosphates  3 packet Oral Q4H  . predniSONE  5 mg Oral Q breakfast  . sodium chloride flush  3 mL Intravenous Q12H  . tacrolimus  1 mg Oral BID   . sodium chloride 10 mL/hr at 05/29/20 1405  . dextrose 75 mL/hr at 05/29/20 1407   sodium chloride, albuterol, hydrALAZINE, hydrALAZINE, metoprolol tartrate

## 2020-05-29 NOTE — Progress Notes (Signed)
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 Rare GPCs seen on gram stain as well, but no growth of these. Was started on Ancef this evening (transitioned from a broader regimen of antibiotics) but subsequently IV access has been lost and unable to be obtained. There has not yet been any ID consult.   eICU Interventions  For now, I have discontinued the Ancef IV and replaced it with Ciprofloxacin 500mg  PO BID (renally adjusted dose for treatment of osteomyelitis).  In the AM, if IV antibiotic therapy is felt to be necessary, a midline or PICC can be considered and antibiotics switched back after this access is obtained.     Intervention Category Major Interventions: Infection - evaluation and management  Marily Lente Blythe Veach 05/29/2020, 12:04 AM

## 2020-05-29 NOTE — Progress Notes (Addendum)
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  Patient apparently awake but confused, complaining of right-sided neck and back pain. 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 Tissue culture revealed rare gram.positive cocci, pansensitive Proteus.  Patient was given Norco for pain control.  Approximately 8 hours prior to admission to the ED patient was difficult to arouse. At baseline he is normally conversant and answers questions normally but wife is concerned the pain medication (Norco) he took from his hand surgery made him delirious. ED Course: T100.88F, HR 110, RR 20, BP 224/102, SPO2 93% on room air.  Noted to be confused/belligerent and yelling,required chemical sedation Na 149, K5.4, bicarb 12, glucose 152, BUN 74, creatinine 2.71, AG 15, AST 63, ALT 14, T bili 1.4, CK 1566, troponin 182->226, EKG normal sinus rhythm with nonspecific ST-T changes in antero-lateral leads with PVCs.  QTC 421 ms.  WBC 12.0, Hb 9.8, platelets 111, INR 1.5, UA negative, UDS positive for opiates.Trauma work-up in the ED (CT chest abdomen pelvis, thoracic/lumbar spine x-rays) negative except for small pleural effusions bilaterally.He received cefepime, vancomycin, 1 L NS bolus,, labetalol 20 mg x 1, Geodon milligrams x1. Hospital course: Patient admitted to [facility] for further evaluation and management of altered mental status.  Subjective:  Patient continues to show steady improvement in mental status.  He was able to tell me the  month but not the year.  He is much more communicative with sensible conversation today.  Daughter at bedside.  Lost IV access last night.  IV cefazolin changed to p.o. Augmentin.  BP elevated  Objective: Vitals:   05/29/20 0809 05/29/20 0900 05/29/20 1000 05/29/20 1100  BP:  (!) 186/86 (!) 170/75 (!) 179/77  Pulse:  90 87 78  Resp:  (!) 25 16 16   Temp: 97.8 F (36.6 C)     TempSrc: Oral     SpO2:  94% 96% 93%  Weight:      Height:        Intake/Output Summary (Last 24 hours) at 05/29/2020 1114 Last data filed at 05/29/2020 0800 Gross per 24 hour  Intake 1449.17 ml  Output 0 ml  Net 1449.17 ml   Filed Weights   05/26/20 0455 05/28/20 0500  Weight: 88.9 kg 83.4 kg    Physical Examination:  General: Moderately built, no acute distress noted Head ENT: Small scalp hematoma, dry cracked lips with crusted blood Heart: S1-S2 heard, regular rate and rhythm, no murmurs.  Trace leg edema noted Lungs: Equal air entry bilaterally, no rhonchi or rales on exam, no accessory muscle use Abdomen: Bowel sounds heard, soft, nontender, nondistended. No organomegaly.  No CVA tenderness Extremities/skin: Splint in place along right index finger.  Trace pedal edema.  Superficial skin tears along right leg and right arm with dressing in place.  No cyanosis or clubbing. Neurological: Awake alert oriented x2.  Much more communicative.  Nonfocal, moving all extremities       Data Reviewed: I have personally reviewed following labs and imaging studies  CBC: Recent Labs  Lab 05/26/20 0441  05/27/20 0410 05/28/20 0302 05/29/20 0249  WBC 12.0* 10.1 12.5* 11.8*  HGB 9.8* 8.6* 9.5* 9.3*  HCT 34.2* 29.4* 31.9* 31.1*  MCV 100.3* 97.7 95.8 96.6  PLT 111* 90* 122* 696*   Basic Metabolic Panel: Recent Labs  Lab 05/26/20 0441 05/26/20 0844 05/26/20 1722 05/27/20 0410 05/27/20 1821 05/28/20 0302 05/29/20 0249  NA   < >  --  148* 147* 152* 152* 149*  K   < >  --  4.7 4.5 4.1 4.6 3.6  CL   < >   --  122* 118* 120* 119* 120*  CO2   < >  --  13* 15* 20* 19* 22  GLUCOSE   < >  --  279* 372* 303* 313* 172*  BUN   < >  --  69* 61* 56* 53* 54*  CREATININE   < >  --  2.47* 2.43* 2.11* 2.03* 2.00*  2.06*  CALCIUM   < >  --  8.7* 8.9 9.3 9.2 9.0  MG  --  2.2  --   --   --   --   --   PHOS  --   --   --   --   --  2.7 1.8*   < > = values in this interval not displayed.   GFR: Estimated Creatinine Clearance: 32.4 mL/min (A) (by C-G formula based on SCr of 2.06 mg/dL (H)). Liver Function Tests: Recent Labs  Lab 05/26/20 0441 05/27/20 0410 05/28/20 0302 05/29/20 0249  AST 63* 57*  --   --   ALT 14 16  --   --   ALKPHOS 71 59  --   --   BILITOT 1.4* 1.2  --   --   PROT 7.0 5.9*  --   --   ALBUMIN 3.9 3.3* 3.2* 3.0*   Recent Labs  Lab 05/26/20 0441  LIPASE 26   Recent Labs  Lab 05/26/20 0443  AMMONIA 27   Coagulation Profile: Recent Labs  Lab 05/26/20 0441  INR 1.5*   Cardiac Enzymes: Recent Labs  Lab 05/26/20 0441  CKTOTAL 1,566*   BNP (last 3 results) No results for input(s): PROBNP in the last 8760 hours. HbA1C: No results for input(s): HGBA1C in the last 72 hours. CBG: Recent Labs  Lab 05/28/20 0803 05/28/20 1151 05/28/20 1720 05/28/20 2108 05/29/20 0737  GLUCAP 283* 227* 285* 193* 133*   Lipid Profile: No results for input(s): CHOL, HDL, LDLCALC, TRIG, CHOLHDL, LDLDIRECT in the last 72 hours. Thyroid Function Tests: No results for input(s): TSH, T4TOTAL, FREET4, T3FREE, THYROIDAB in the last 72 hours. Anemia Panel: No results for input(s): VITAMINB12, FOLATE, FERRITIN, TIBC, IRON, RETICCTPCT in the last 72 hours. Sepsis Labs: No results for input(s): PROCALCITON, LATICACIDVEN in the last 168 hours.  Recent Results (from the past 240 hour(s))  Aerobic/Anaerobic Culture (surgical/deep wound)     Status: None   Collection Time: 05/22/20  9:15 AM   Specimen: PATH Soft tissue; Wound  Result Value Ref Range Status   Specimen Description WOUND   Final   Special Requests RIGHT INDEX FINGER SPEC A  Final   Gram Stain   Final    RARE WBC PRESENT, PREDOMINANTLY PMN RARE GRAM POSITIVE COCCI    Culture   Final    ABUNDANT PROTEUS MIRABILIS NO ANAEROBES ISOLATED Performed at [facility], [address], [place], [place] [address]    Report Status 05/27/2020 FINAL  Final   Organism ID, Bacteria PROTEUS  MIRABILIS  Final      Susceptibility   Proteus mirabilis - MIC*    AMPICILLIN <=2 SENSITIVE Sensitive     CEFAZOLIN <=4 SENSITIVE Sensitive     CEFEPIME <=0.12 SENSITIVE Sensitive     CEFTAZIDIME <=1 SENSITIVE Sensitive     CEFTRIAXONE <=0.25 SENSITIVE Sensitive     CIPROFLOXACIN <=0.25 SENSITIVE Sensitive     GENTAMICIN <=1 SENSITIVE Sensitive     IMIPENEM 2 SENSITIVE Sensitive     TRIMETH/SULFA <=20 SENSITIVE Sensitive     AMPICILLIN/SULBACTAM <=2 SENSITIVE Sensitive     PIP/TAZO <=4 SENSITIVE Sensitive     * ABUNDANT PROTEUS MIRABILIS  Blood culture (routine x 2)     Status: None (Preliminary result)   Collection Time: 05/26/20  4:42 AM   Specimen: BLOOD  Result Value Ref Range Status   Specimen Description   Final    BLOOD RIGHT HAND Performed at [facility] [address], [place], [place] [address]    Special Requests   Final    BOTTLES DRAWN AEROBIC AND ANAEROBIC Blood Culture results may not be optimal due to an inadequate volume of blood received in culture bottles Performed at [facility] [address] [place], [place] [address]    Culture   Final    NO GROWTH 3 DAYS Performed at [facility], [place] [address] [place], [place] [address]    Report Status PENDING  Incomplete  Urine Culture     Status: Abnormal   Collection Time: 05/26/20  4:42 AM   Specimen: Urine, Random  Result Value Ref Range Status   Specimen Description   Final    URINE, RANDOM Performed at [facility] [address] [place], [place] [address]    Special  Requests   Final    NONE Performed at [facility], [place] [address] [place], [place] [address]    Culture (A)  Final    <10,000 COLONIES/mL INSIGNIFICANT GROWTH Performed at [facility] [address], [place], [place] [address]    Report Status 05/27/2020 FINAL  Final  SARS Coronavirus 2 by RT PCR (hospital order, performed in [facility] hospital lab) Nasopharyngeal Nasopharyngeal Swab     Status: None   Collection Time: 05/26/20  4:43 AM   Specimen: Nasopharyngeal Swab  Result Value Ref Range Status   SARS Coronavirus 2 NEGATIVE NEGATIVE Final    Comment: (NOTE) SARS-CoV-2 target nucleic acids are NOT DETECTED.  The SARS-CoV-2 RNA is generally detectable in upper and lower respiratory specimens during the acute phase of infection. The lowest concentration of SARS-CoV-2 viral copies this assay can detect is 250 copies / mL. A negative result does not preclude SARS-CoV-2 infection and should not be used as the sole basis for treatment or other patient management decisions.  A negative result may occur with improper specimen collection / handling, submission of specimen other than nasopharyngeal swab, presence of viral mutation(s) within the areas targeted by this assay, and inadequate number of viral copies (<250 copies / mL). A negative result must be combined with clinical observations, patient history, and epidemiological information.  Fact Sheet for Patients:   StrictlyIdeas.no  Fact Sheet for Healthcare Providers: BankingDealers.co.za  This test is not yet approved or  cleared by the [place] FDA and has been authorized for detection and/or diagnosis of SARS-CoV-2 by FDA under an Emergency Use Authorization (EUA).  This EUA will remain in effect (meaning this test can  be used) for the duration of the COVID-19 declaration under Section 564(b)(1) of the Act, 21 U.S.C. section 360bbb-3(b)(1),  unless the authorization is terminated or revoked sooner.  Performed at [facility], [place] [address], [place], [place] [address]   MRSA PCR Screening     Status: None   Collection Time: 05/27/20 10:05 AM   Specimen: Nasopharyngeal  Result Value Ref Range Status   MRSA by PCR NEGATIVE NEGATIVE Final    Comment:        The GeneXpert MRSA Assay (FDA approved for NASAL specimens only), is one component of a comprehensive MRSA colonization surveillance program. It is not intended to diagnose MRSA infection nor to guide or monitor treatment for MRSA infections. Performed at [facility], [place] [address] [place], [place] [address]       Radiology Studies: MR BRAIN WO CONTRAST  Result Date: 05/28/2020 CLINICAL DATA:  Delirium EXAM: MRI HEAD WITHOUT CONTRAST TECHNIQUE: Multiplanar, multiecho pulse sequences of the brain and surrounding structures were obtained without intravenous contrast. COMPARISON:  None. FINDINGS: Motion artifact is present. Brain: There is no acute infarction or intracranial hemorrhage. There is no intracranial mass, mass effect, or edema. There is no hydrocephalus or extra-axial fluid collection. Small chronic left occipital infarct. Minimal patchy T2 hyperintensity in the supratentorial white matter is nonspecific but may reflect minor chronic microvascular ischemic changes. Prominence of the ventricles and sulci reflects generalized parenchymal volume loss. Vascular: Major vessel flow voids at the skull base are preserved. Skull and upper cervical spine: Normal marrow signal is preserved. Sinuses/Orbits: Paranasal sinuses are aerated. Orbits are unremarkable. Other: Sella is unremarkable.  Mastoid air cells are clear. IMPRESSION: Motion degraded study. No evidence of recent infarction, hemorrhage, or mass. Small chronic left occipital infarct. Electronically Signed   By: Macy Mis M.D.   On: 05/28/2020 16:27      Scheduled  Meds: . amLODipine  5 mg Oral Daily  . amoxicillin-clavulanate  1 tablet Oral Q12H  . atorvastatin  10 mg Oral Daily  . carvedilol  6.25 mg Oral BID WC  . Chlorhexidine Gluconate Cloth  6 each Topical Q0600  . heparin  5,000 Units Subcutaneous Q8H  . insulin aspart  0-15 Units Subcutaneous TID WC  . insulin aspart  0-5 Units Subcutaneous QHS  . insulin aspart  3 Units Subcutaneous TID WC  . insulin glargine  15 Units Subcutaneous QHS  . mouth rinse  15 mL Mouth Rinse BID  . mycophenolate  500 mg Oral BID  . nitroGLYCERIN  0.1 mg Transdermal Daily  . predniSONE  5 mg Oral Q breakfast  . sodium chloride flush  3 mL Intravenous Q12H  . tacrolimus  0.5 mg Sublingual BID   Continuous Infusions: . sodium chloride Stopped (05/28/20 1340)  . dextrose Stopped (05/28/20 1730)       Assessment/Plan:  1.  Acute metabolic encephalopathy: Present on admission.  Likely multifactorial secondary to profound dehydration, hypernatremia, AKI and opiate use.  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  Patient also with uncontrolled hypertension on presentation, however transient and improved blood pressure since then.  Patient has very dry appearance.  Ketones in urine possibly reflective of starvation ketosis and poor oral intake. Urine drug screen positive for opiates which he was prescribed on last discharge.  Tacrolimus level resulted as low normal at 2.2.  Continue  IV fluids.  Diet as tolerated.    2.  Acute metabolic acidosis, possible mild DKA: Present on admission.  Metabolic acidosis secondary to renal failure versus sepsis versus mild DKA.  Labs showed beta hydroxybutyrate elevation at 4.35 on presentation raising possibility of mild DKA.  Patient started on IV cefepime, vancomycin and Flagyl for possible underlying infection.  T-max 100.6  in ED x1, afebrile  since then.   DCed vancomycin as MRSA PCR negative.  Urine cultures with less than 10,000 colony-forming units.  Blood cultures so far negative.    Mild leukocytosis on presentation now resolved.  Narrowed antibiotics to IV cefazolin on 9/8-Lost IV access and now on p.o. Augmentin (cefazolin only to cover Proteus from prior wound cultures).  Lactate level, procalcitonin not done on admission-added onto prior labs.  3.  Acute on chronic renal failure with hypernatremia, s/p renal transplant: Secondary to dehydration. Home diuretics (Lasix 80 mg twice daily) on hold. Seen by nephrology and started on bicarb supplemented D5W. Renal function improved with creatinine now stable around 2.0.  Still has hypernatremia with sodium 152 yesterday->149 today.  IV fluids renewed yesterday but patient lost IV access.  Will request central line and resume IV hydration today.  Resumed chronic immunosuppressants/transplant medicines as able to take p.o. now.  4. Hypertensive urgency: Patient presented with BP 224/102 in the setting of confusion and agitation.  BP improved now but still elevated.  Continue oral beta-blockers, Norvasc at home dose as able to take p.o. change Nitropatch to p.o. meds. Hydralazine IV as needed available.    5.  Elevated CK: Patient with CK level~1500.  Likely secondary to falls, being on the floor.  Continue IV hydration and repeat levels in a.m.  6.  Elevated troponin: In the setting of renal failure and possible demand ischemia from uncontrolled blood pressure.  EKG with nonspecific ST-T changes.  Echocardiogram shows preserved EF..  Cardiac monitoring in stepdown unit.  7.  Chronic diastolic CHF: Repeat echo shows preserved EF.  Appears dry as of now.  Monitor for signs of fluid overload on IV hydration.  Currently saturating well on room air.  8.  Paroxysmal atrial fibrillation: EKG on admission showed sinus rhythm On Coreg at home-resume as able to take p.o.Marland Kitchen Marland Kitchen  Not on chronic  anticoagulation.  9.  Diabetes mellitus: Holding home regimen.  Now more awake and eating.  Being managed with Lantus 15 units and premeal insulin while on dextrose fluids..  10.  Hyperlipidemia: Resume home medications when able to take p.o.  11.  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 Fredna Dow.  Supposed to have dressing change today in clinic per daughter.  Will request wound care to assist.  Patient also with poor IV access, requested ICU team to help with central line.  12. Superficial skin tears on extremities: Wound care recommendations appreciated  DVT prophylaxis: Heparin subcu Code Status: Full code Family / Patient Communication: Discussed with patient and daughter at bedside Disposition Plan:   Status is: Inpatient  Remains inpatient appropriate because:Altered mental status   Dispo: The patient is from: Home              Anticipated d/c is to: To be decided, PT eval pending              Anticipated d/c date is: > 3 days  Patient currently is not medically stable to d/c.          Time spent: 40 minutes     >50% time spent in discussions with care team and coordination of care.    Guilford Shi, MD [facility] Pager in [place]  If 7PM-7AM, please contact night-coverage [email or web address] 05/29/2020, 11:14 AM

## 2020-05-30 LAB — RENAL FUNCTION PANEL
Albumin: 2.9 g/dL — ABNORMAL LOW (ref 3.5–5.0)
Anion gap: 10 (ref 5–15)
BUN: 46 mg/dL — ABNORMAL HIGH (ref 8–23)
CO2: 24 mmol/L (ref 22–32)
Calcium: 8.6 mg/dL — ABNORMAL LOW (ref 8.9–10.3)
Chloride: 111 mmol/L (ref 98–111)
Creatinine, Ser: 1.86 mg/dL — ABNORMAL HIGH (ref 0.61–1.24)
GFR calc Af Amer: 41 mL/min — ABNORMAL LOW (ref >60)
GFR calc non Af Amer: 35 mL/min — ABNORMAL LOW (ref >60)
Glucose, Bld: 151 mg/dL — ABNORMAL HIGH (ref 70–99)
Phosphorus: 2.2 mg/dL — ABNORMAL LOW (ref 2.5–4.6)
Potassium: 3.9 mmol/L (ref 3.5–5.1)
Sodium: 145 mmol/L (ref 135–145)

## 2020-05-30 LAB — GLUCOSE, CAPILLARY
Glucose-Capillary: 158 mg/dL — ABNORMAL HIGH (ref 70–99)
Glucose-Capillary: 141 mg/dL — ABNORMAL HIGH (ref 70–99)
Glucose-Capillary: 162 mg/dL — ABNORMAL HIGH (ref 70–99)
Glucose-Capillary: 165 mg/dL — ABNORMAL HIGH (ref 70–99)

## 2020-05-30 LAB — CBC
HCT: 30.1 % — ABNORMAL LOW (ref 39.0–52.0)
Hemoglobin: 9.1 g/dL — ABNORMAL LOW (ref 13.0–17.0)
MCH: 29 pg (ref 26.0–34.0)
MCHC: 30.2 g/dL (ref 30.0–36.0)
MCV: 95.9 fL (ref 80.0–100.0)
Platelets: 107 10*3/uL — ABNORMAL LOW (ref 150–400)
RBC: 3.14 MIL/uL — ABNORMAL LOW (ref 4.22–5.81)
RDW: 14.9 % (ref 11.5–15.5)
WBC: 11.6 10*3/uL — ABNORMAL HIGH (ref 4.0–10.5)
nRBC: 0 % (ref 0.0–0.2)

## 2020-05-30 MED ORDER — AMLODIPINE BESYLATE 5 MG PO TABS
5.0000 mg | ORAL_TABLET | Freq: Once | ORAL | Status: AC
  Administered 2020-05-30: 5 mg via ORAL
  Filled 2020-05-30: qty 1

## 2020-05-30 MED ORDER — AMLODIPINE BESYLATE 10 MG PO TABS
10.0000 mg | ORAL_TABLET | Freq: Every day | ORAL | Status: DC
  Administered 2020-05-31 – 2020-06-01 (×2): 10 mg via ORAL
  Filled 2020-05-30 (×2): qty 1

## 2020-05-30 NOTE — Evaluation (Signed)
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  Patient apparently awake but confused, complaining of right-sided neck and back pain. 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 Fredna Dow on 9/2/202.  Pt admitted for Acute metabolic encephalopathy: Present on admission.  Likely multifactorial secondary to profound dehydration, hypernatremia, AKI and opiate use  Clinical Impression  Pt admitted with above diagnosis.  Pt currently with functional limitations due to the deficits listed below (see PT Problem List). Pt will benefit from skilled PT to increase their independence and safety with mobility to allow discharge to the venue listed below.  Session limited by pt's cognition.  Pt not answering questions at times, unable to state where he has pain.  Pt with groaning and grimacing with any attempts to move UEs and LEs.  Pt did not assist with repositioning.  Pt will likely need SNF upon d/c.      Follow Up Recommendations SNF    Equipment Recommendations  Other (comment) (TBD if home)    Recommendations for Other Services       Precautions / Restrictions Precautions Precautions: Fall Restrictions Weight Bearing Restrictions: No      Mobility  Bed Mobility Overal bed mobility: Needs Assistance             General bed mobility comments: pt not assisting with repositioning, deferred mobilization - would need +2  Transfers                    Ambulation/Gait                Stairs            Wheelchair Mobility    Modified  Rankin (Stroke Patients Only)       Balance                                             Pertinent Vitals/Pain Pain Assessment: Faces Faces Pain Scale: Hurts even more Pain Location: none specific, winces and moan in pain with any assisted movement of extremities Pain Descriptors / Indicators: Grimacing Pain Intervention(s): Repositioned;Monitored during session    Home Living Family/patient expects to be discharged to:: Private residence Living Arrangements: Spouse/significant other   Type of Home: House Home Access: Stairs to enter     Home Layout: One level Home Equipment: None Additional Comments: above per admission in 2019, pt poor historian    Prior Function Level of Independence: Needs assistance   Gait / Transfers Assistance Needed: uncertain however admitted from home with spouse           Hand Dominance        Extremity/Trunk Assessment   Upper Extremity Assessment Upper Extremity Assessment: RUE deficits/detail RUE Deficits / Details: s/p recent R index finger amputation, pt with edematous hand, pt would not perform active wrist or hand movement, performed gentle stretching at hand    Lower Extremity Assessment Lower Extremity Assessment: Generalized weakness (pt no providing any assist and groaning in pain  with attempts to perform PROM)       Communication   Communication: Other (comment) (pt with little verbalization)  Cognition Arousal/Alertness: Awake/alert Behavior During Therapy: Flat affect Overall Cognitive Status: No family/caregiver present to determine baseline cognitive functioning                                 General Comments: pt able to state his name however not answering other orientation questions, pt would occasionally answer yes/no      General Comments      Exercises     Assessment/Plan    PT Assessment Patient needs continued PT services  PT Problem List Decreased  strength;Decreased mobility;Decreased knowledge of use of DME;Decreased activity tolerance;Decreased cognition       PT Treatment Interventions DME instruction;Therapeutic exercise;Gait training;Balance training;Functional mobility training;Therapeutic activities;Patient/family education    PT Goals (Current goals can be found in the Care Plan section)  Acute Rehab PT Goals PT Goal Formulation: With patient Time For Goal Achievement: 06/13/20 Potential to Achieve Goals: Fair    Frequency Min 2X/week   Barriers to discharge        Co-evaluation               AM-PAC PT "6 Clicks" Mobility  Outcome Measure Help needed turning from your back to your side while in a flat bed without using bedrails?: Total Help needed moving from lying on your back to sitting on the side of a flat bed without using bedrails?: Total Help needed moving to and from a bed to a chair (including a wheelchair)?: Total Help needed standing up from a chair using your arms (e.g., wheelchair or bedside chair)?: Total Help needed to walk in hospital room?: Total Help needed climbing 3-5 steps with a railing? : Total 6 Click Score: 6    End of Session   Activity Tolerance: Patient limited by fatigue;Patient limited by pain Patient left: in bed;with call bell/phone within reach;with bed alarm set Nurse Communication: Mobility status PT Visit Diagnosis: Difficulty in walking, not elsewhere classified (R26.2);Muscle weakness (generalized) (M62.81)    Time: 0981-1914 PT Time Calculation (min) (ACUTE ONLY): 14 min   Charges:   PT Evaluation $PT Eval Low Complexity: 1 Low        Kati PT, DPT Acute Rehabilitation Services Pager: [phone] Office: [phone]  York Ram E 05/30/2020, 1:24 PM

## 2020-05-30 NOTE — Progress Notes (Addendum)
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  Patient apparently awake but confused, complaining of right-sided neck and back pain. 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 Tissue culture revealed rare gram.positive cocci, pansensitive Proteus.  Patient was given Norco for pain control.  Approximately 8 hours prior to admission to the ED patient was difficult to arouse. At baseline he is normally conversant and answers questions normally but wife is concerned the pain medication (Norco) he took from his hand surgery made him delirious. ED Course: T100.71F, HR 110, RR 20, BP 224/102, SPO2 93% on room air.  Noted to be confused/belligerent and yelling,required chemical sedation Na 149, K5.4, bicarb 12, glucose 152, BUN 74, creatinine 2.71, AG 15, AST 63, ALT 14, T bili 1.4, CK 1566, troponin 182->226, EKG normal sinus rhythm with nonspecific ST-T changes in antero-lateral leads with PVCs.  QTC 421 ms.  WBC 12.0, Hb 9.8, platelets 111, INR 1.5, UA negative, UDS positive for opiates.Trauma work-up in the ED (CT chest abdomen pelvis, thoracic/lumbar spine x-rays) negative except for small pleural effusions bilaterally.He received cefepime, vancomycin, 1 L NS bolus,, labetalol 20 mg x 1, Geodon milligrams x1. Hospital course: Patient admitted to [facility] for further evaluation and management of altered mental status.  Subjective:  Patient awake and eating with assistance.  He is again disoriented to time and place today but  overall more awake, following commands..  Noted intermittent lipsmacking movements during the hospital course.  Blood pressure still elevated this morning to systolic 884Z. Objective: Vitals:   05/30/20 1000 05/30/20 1041 05/30/20 1100 05/30/20 1200  BP: (!) 155/65 (!) 155/65 (!) 165/68   Pulse: 81  79 80  Resp: (!) 28  (!) 22 (!) 24  Temp:    99.7 F (37.6 C)  TempSrc:    Oral  SpO2: 90%  96% 96%  Weight:      Height:        Intake/Output Summary (Last 24 hours) at 05/30/2020 1525 Last data filed at 05/30/2020 0400 Gross per 24 hour  Intake 1180.41 ml  Output 1200 ml  Net -19.59 ml   Filed Weights   05/26/20 0455 05/28/20 0500 05/30/20 0500  Weight: 88.9 kg 83.4 kg 86.2 kg    Physical Examination:  General: Moderately built, no acute distress noted Head ENT: Small scalp hematoma, dry cracked lips with crusted blood Heart: S1-S2 heard, regular rate and rhythm, no murmurs.  Trace leg edema noted Lungs: Equal air entry bilaterally, no rhonchi or rales on exam, no accessory muscle use Abdomen: Bowel sounds heard, soft, nontender, nondistended. No organomegaly.  No CVA tenderness Extremities/skin: New dressing in place along right index finger.  Trace pedal edema.  Superficial skin tears along right leg and right arm with dressing in place.  No cyanosis or clubbing. Neurological: Awake alert oriented x2.  Much more communicative.  Nonfocal, moving all extremities   Data Reviewed: I have personally reviewed following labs and imaging studies  CBC: Recent Labs  Lab 05/26/20 0441 05/27/20 0410 05/28/20 0302 05/29/20 0249 05/30/20 1110  WBC 12.0* 10.1 12.5* 11.8* 11.6*  HGB 9.8* 8.6* 9.5* 9.3* 9.1*  HCT 34.2* 29.4* 31.9* 31.1* 30.1*  MCV 100.3* 97.7 95.8 96.6 95.9  PLT 111* 90* 122* 120* 099*   Basic Metabolic Panel: Recent Labs  Lab 05/26/20 0844 05/26/20 1722 05/27/20 1821 05/28/20 0302 05/29/20 0249 05/29/20 2048 05/30/20 1110  NA  --    < > 152* 152* 149*  147* 145  K  --    < > 4.1 4.6 3.6 4.3 3.9  CL  --    < > 120* 119* 120* 116* 111  CO2  --    < > 20* 19* 22 24 24   GLUCOSE  --    < > 303* 313* 172* 235* 151*  BUN  --    < > 56* 53* 54* 49* 46*  CREATININE  --    < > 2.11* 2.03* 2.00*  2.06* 1.95* 1.86*  CALCIUM  --    < > 9.3 9.2 9.0 8.9 8.6*  MG 2.2  --   --   --   --  1.8  --   PHOS  --   --   --  2.7 1.8*  --  2.2*   < > = values in this interval not displayed.   GFR: Estimated Creatinine Clearance: 39 mL/min (A) (by C-G formula based on SCr of 1.86 mg/dL (H)). Liver Function Tests: Recent Labs  Lab 05/26/20 0441 05/27/20 0410 05/28/20 0302 05/29/20 0249 05/30/20 1110  AST 63* 57*  --   --   --   ALT 14 16  --   --   --   ALKPHOS 71 59  --   --   --   BILITOT 1.4* 1.2  --   --   --   PROT 7.0 5.9*  --   --   --   ALBUMIN 3.9 3.3* 3.2* 3.0* 2.9*   Recent Labs  Lab 05/26/20 0441  LIPASE 26   Recent Labs  Lab 05/26/20 0443  AMMONIA 27   Coagulation Profile: Recent Labs  Lab 05/26/20 0441  INR 1.5*   Cardiac Enzymes: Recent Labs  Lab 05/26/20 0441  CKTOTAL 1,566*   BNP (last 3 results) No results for input(s): PROBNP in the last 8760 hours. HbA1C: No results for input(s): HGBA1C in the last 72 hours. CBG: Recent Labs  Lab 05/29/20 1125 05/29/20 1603 05/29/20 2205 05/30/20 0745 05/30/20 1231  GLUCAP 109* 150* 230* 158* 141*   Lipid Profile: No results for input(s): CHOL, HDL, LDLCALC, TRIG, CHOLHDL, LDLDIRECT in the last 72 hours. Thyroid Function Tests: No results for input(s): TSH, T4TOTAL, FREET4, T3FREE, THYROIDAB in the last 72 hours. Anemia Panel: No results for input(s): VITAMINB12, FOLATE, FERRITIN, TIBC, IRON, RETICCTPCT in the last 72 hours. Sepsis Labs: Recent Labs  Lab 05/29/20 1152  PROCALCITON 6.03  LATICACIDVEN 1.0    Recent Results (from the past 240 hour(s))  Aerobic/Anaerobic Culture (surgical/deep wound)     Status: None   Collection Time: 05/22/20  9:15 AM    Specimen: PATH Soft tissue; Wound  Result Value Ref Range Status   Specimen Description WOUND  Final   Special Requests RIGHT INDEX FINGER SPEC A  Final   Gram Stain   Final    RARE WBC PRESENT, PREDOMINANTLY PMN RARE GRAM POSITIVE COCCI    Culture   Final    ABUNDANT PROTEUS MIRABILIS NO ANAEROBES ISOLATED Performed at [facility], [address] [place], [place] [address]  Report Status 05/27/2020 FINAL  Final   Organism ID, Bacteria PROTEUS MIRABILIS  Final      Susceptibility   Proteus mirabilis - MIC*    AMPICILLIN <=2 SENSITIVE Sensitive     CEFAZOLIN <=4 SENSITIVE Sensitive     CEFEPIME <=0.12 SENSITIVE Sensitive     CEFTAZIDIME <=1 SENSITIVE Sensitive     CEFTRIAXONE <=0.25 SENSITIVE Sensitive     CIPROFLOXACIN <=0.25 SENSITIVE Sensitive     GENTAMICIN <=1 SENSITIVE Sensitive     IMIPENEM 2 SENSITIVE Sensitive     TRIMETH/SULFA <=20 SENSITIVE Sensitive     AMPICILLIN/SULBACTAM <=2 SENSITIVE Sensitive     PIP/TAZO <=4 SENSITIVE Sensitive     * ABUNDANT PROTEUS MIRABILIS  Blood culture (routine x 2)     Status: None (Preliminary result)   Collection Time: 05/26/20  4:42 AM   Specimen: BLOOD  Result Value Ref Range Status   Specimen Description   Final    BLOOD RIGHT HAND Performed at [facility] [address] [place], [place] [address]    Special Requests   Final    BOTTLES DRAWN AEROBIC AND ANAEROBIC Blood Culture results may not be optimal due to an inadequate volume of blood received in culture bottles Performed at [facility] [address], [place], [place] [address]    Culture   Final    NO GROWTH 4 DAYS Performed at [facility], [place] [address], [place], [place] [address]    Report Status PENDING  Incomplete  Urine Culture     Status: Abnormal   Collection Time: 05/26/20  4:42 AM   Specimen: Urine, Random  Result Value Ref Range Status   Specimen Description   Final    URINE, RANDOM Performed  at [facility] [address], [place], [place] [address]    Special Requests   Final    NONE Performed at [facility], [place] [address] [place], [place] [address]    Culture (A)  Final    <10,000 COLONIES/mL INSIGNIFICANT GROWTH Performed at [facility] [address] [place], [place] [address]    Report Status 05/27/2020 FINAL  Final  SARS Coronavirus 2 by RT PCR (hospital order, performed in [facility] hospital lab) Nasopharyngeal Nasopharyngeal Swab     Status: None   Collection Time: 05/26/20  4:43 AM   Specimen: Nasopharyngeal Swab  Result Value Ref Range Status   SARS Coronavirus 2 NEGATIVE NEGATIVE Final    Comment: (NOTE) SARS-CoV-2 target nucleic acids are NOT DETECTED.  The SARS-CoV-2 RNA is generally detectable in upper and lower respiratory specimens during the acute phase of infection. The lowest concentration of SARS-CoV-2 viral copies this assay can detect is 250 copies / mL. A negative result does not preclude SARS-CoV-2 infection and should not be used as the sole basis for treatment or other patient management decisions.  A negative result may occur with improper specimen collection / handling, submission of specimen other than nasopharyngeal swab, presence of viral mutation(s) within the areas targeted by this assay, and inadequate number of viral copies (<250 copies / mL). A negative result must be combined with clinical observations, patient history, and epidemiological information.  Fact Sheet for Patients:   StrictlyIdeas.no  Fact Sheet for Healthcare Providers: BankingDealers.co.za  This test is not yet approved or  cleared by the [place] FDA and has been authorized for detection and/or diagnosis of SARS-CoV-2 by FDA under an Emergency Use Authorization (  EUA).  This EUA will remain in effect (meaning this test can be used) for the duration of  the COVID-19 declaration under Section 564(b)(1) of the Act, 21 U.S.C. section 360bbb-3(b)(1), unless the authorization is terminated or revoked sooner.  Performed at [facility], [place] [address], [place], [place] [address]   MRSA PCR Screening     Status: None   Collection Time: 05/27/20 10:05 AM   Specimen: Nasopharyngeal  Result Value Ref Range Status   MRSA by PCR NEGATIVE NEGATIVE Final    Comment:        The GeneXpert MRSA Assay (FDA approved for NASAL specimens only), is one component of a comprehensive MRSA colonization surveillance program. It is not intended to diagnose MRSA infection nor to guide or monitor treatment for MRSA infections. Performed at [facility], [place] [address] [place], [place] [address]       Radiology Studies: MR BRAIN WO CONTRAST  Result Date: 05/28/2020 CLINICAL DATA:  Delirium EXAM: MRI HEAD WITHOUT CONTRAST TECHNIQUE: Multiplanar, multiecho pulse sequences of the brain and surrounding structures were obtained without intravenous contrast. COMPARISON:  None. FINDINGS: Motion artifact is present. Brain: There is no acute infarction or intracranial hemorrhage. There is no intracranial mass, mass effect, or edema. There is no hydrocephalus or extra-axial fluid collection. Small chronic left occipital infarct. Minimal patchy T2 hyperintensity in the supratentorial white matter is nonspecific but may reflect minor chronic microvascular ischemic changes. Prominence of the ventricles and sulci reflects generalized parenchymal volume loss. Vascular: Major vessel flow voids at the skull base are preserved. Skull and upper cervical spine: Normal marrow signal is preserved. Sinuses/Orbits: Paranasal sinuses are aerated. Orbits are unremarkable. Other: Sella is unremarkable.  Mastoid air cells are clear. IMPRESSION: Motion degraded study. No evidence of recent infarction, hemorrhage, or mass. Small chronic left occipital  infarct. Electronically Signed   By: Macy Mis M.D.   On: 05/28/2020 16:27   DG CHEST PORT 1 VIEW  Result Date: 05/29/2020 CLINICAL DATA:  Central line placement EXAM: PORTABLE CHEST 1 VIEW.  Patient is slightly rotated. COMPARISON:  Chest x-ray 05/26/2020, CT chest 05/26/2020. FINDINGS: Interval placement of a left internal jugular central venous catheter with tip overlying the expected region of the superior cavoatrial junction. Moderate cardiomegaly the may be exaggerated due to portable AP technique. The heart size and mediastinal contours are unchanged. Aortic arch calcification. Hilar vasculature prominence. Improved aeration of bilateral lungs. No focal consolidation. No pulmonary edema. Persistent bilateral trace pleural effusions. No pneumothorax. No acute osseous abnormality.  Old healed left rib fractures. IMPRESSION: New left internal jugular central venous catheter with tip overlying the expected region of the superior cavoatrial junction. Persistent bilateral trace pleural effusions in a patient with moderate cardiomegaly. Electronically Signed   By: Iven Finn M.D.   On: 05/29/2020 13:01      Scheduled Meds: . [START ON 05/31/2020] amLODipine  10 mg Oral Daily  . amoxicillin-clavulanate  1 tablet Oral Q12H  . atorvastatin  10 mg Oral Daily  . carvedilol  6.25 mg Oral BID WC  . Chlorhexidine Gluconate Cloth  6 each Topical Q0600  . heparin  5,000 Units Subcutaneous Q8H  . insulin aspart  0-15 Units Subcutaneous TID WC  . insulin aspart  0-5 Units Subcutaneous QHS  . insulin aspart  3 Units Subcutaneous TID WC  . insulin glargine  15 Units Subcutaneous QHS  . mouth rinse  15 mL Mouth Rinse BID  . mycophenolate  500 mg  Oral BID  . nitroGLYCERIN  0.1 mg Transdermal Daily  . predniSONE  5 mg Oral Q breakfast  . sodium chloride flush  3 mL Intravenous Q12H  . tacrolimus  1 mg Oral BID   Continuous Infusions: . sodium chloride 10 mL/hr at 05/29/20 1405  . dextrose 75  mL/hr at 05/30/20 1511       Assessment/Plan:  1.  Acute metabolic encephalopathy: Present on admission.  Likely multifactorial secondary to profound dehydration, hypernatremia, AKI and opiate use.  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  Patient also with uncontrolled hypertension on presentation, however transient and improved blood pressure since then.  Patient had very dry appearance.  Ketones in urine possibly reflective of starvation ketosis and poor oral intake. Urine drug screen positive for opiates which he was prescribed on last discharge.  Tacrolimus level resulted as low normal at 2.2.  Sodium level normalized today.Continue IV fluids for another day.  Diet as tolerated, encourage free water intake.    2.  Acute metabolic acidosis, possible mild DKA: Present on admission.  Metabolic acidosis secondary to renal failure versus sepsis versus mild DKA.  Labs showed beta hydroxybutyrate elevation at 4.35 on presentation raising possibility of mild DKA.  Patient started on IV cefepime, vancomycin and Flagyl for possible underlying infection.  T-max 100.6  in ED x1, afebrile since then.   DCed vancomycin as MRSA PCR negative.  Urine cultures with less than 10,000 colony-forming units.  Blood cultures so far negative.    Mild leukocytosis on presentation now resolved.  Narrowed antibiotics to IV cefazolin on 9/8-Lost IV access and now on p.o. Augmentin ( only to cover Proteus from prior wound cultures, procalcitonin elevated at 6).  Can DC antibiotics in a.m. if continues to do well and afebrile as per Dr. Fredna Dow, index finger looked good during dressing change.  3.  Acute on chronic renal failure with hypernatremia, s/p renal transplant: Secondary to dehydration. Home diuretics (Lasix 80 mg twice daily) on hold. Seen by nephrology and started on bicarb  supplemented D5W. Renal function improved with creatinine now downtrending to 2.0->1.8.  Hypernatremia with sodium 152->149->145 today after IV fluids resumed via central line placed on 9/9 for poor IV access.  Resumed chronic immunosuppressants/transplant medicines as able to take p.o. now.  4. Hypertensive urgency: Patient presented with BP 224/102 in the setting of confusion and agitation.  BP improved now but still elevated.  Continue oral beta-blockers, Nitropatch (can change to p.o.  Imdur).  Resumed Norvasc 10 mg daily given persistently elevated blood pressure, SBP improved to 150s this afternoon.  Hydralazine IV as needed available.    5.  Elevated CK: Patient with CK level~1500.  Likely secondary to falls, being on the floor.  On IV hydration, repeat levels in a.m.  6.  Elevated troponin: In the setting of renal failure and possible demand ischemia from uncontrolled blood pressure.  EKG with nonspecific ST-T changes.  Echocardiogram shows preserved EF..  Cardiac monitoring with no significant arrhythmias  7.  Chronic diastolic CHF: Repeat echo shows preserved EF.  Appears dry as of now.  Monitor for signs of fluid overload on IV hydration.  Currently saturating well on room air.  8.  Paroxysmal atrial fibrillation: EKG on admission showed sinus rhythm On Coreg at home-resume as able to take p.o.Marland Kitchen Marland Kitchen  Not on chronic anticoagulation.  9.  Diabetes  mellitus: Holding home regimen.  Now more awake and eating.  Being managed with Lantus 15 units and premeal insulin while on dextrose fluids..  10.  Hyperlipidemia: Resume home medications when able to take p.o.  11.  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 Fredna Dow.  Supposed to have dressing change 9/9 in clinic per daughter.  Seen by Dr. Fredna Dow and recommended "ensure coban not placed too tight.  If bandaids work better for  maintaining wound coverage they may be used instead.  Keep wound clean and dry.  Encouraged gentle motion of other digits and hand to help with swelling.  Okay to follow up after discharge.  Contact if any concerns".  Can likely DC antibiotics in a.m.  12. Superficial skin tears on extremities: Wound care recommendations appreciated  DVT prophylaxis: Heparin subcu Code Status: Full code Family / Patient Communication: Discussed with patient and daughter at bedside on 9/9 Disposition Plan:   Status is: Inpatient  Remains inpatient appropriate because:Altered mental status   Dispo: The patient is from: Home              Anticipated d/c is to: PT recommending SNF              Anticipated d/c date is: 3 days              Patient currently is not medically stable to d/c.          Time spent: 35 minutes     >50% time spent in discussions with care team and coordination of care.    Guilford Shi, MD [facility] Pager in [place]  If 7PM-7AM, please contact night-coverage [email or web address] 05/30/2020, 3:25 PM

## 2020-05-31 DIAGNOSIS — R400 Somnolence: Secondary | ICD-10-CM

## 2020-05-31 DIAGNOSIS — G9340 Encephalopathy, unspecified: Secondary | ICD-10-CM

## 2020-05-31 LAB — GLUCOSE, CAPILLARY
Glucose-Capillary: 189 mg/dL — ABNORMAL HIGH (ref 70–99)
Glucose-Capillary: 244 mg/dL — ABNORMAL HIGH (ref 70–99)
Glucose-Capillary: 257 mg/dL — ABNORMAL HIGH (ref 70–99)
Glucose-Capillary: 173 mg/dL — ABNORMAL HIGH (ref 70–99)

## 2020-05-31 LAB — BASIC METABOLIC PANEL
Anion gap: 12 (ref 5–15)
BUN: 45 mg/dL — ABNORMAL HIGH (ref 8–23)
CO2: 23 mmol/L (ref 22–32)
Calcium: 8.4 mg/dL — ABNORMAL LOW (ref 8.9–10.3)
Chloride: 106 mmol/L (ref 98–111)
Creatinine, Ser: 2.08 mg/dL — ABNORMAL HIGH (ref 0.61–1.24)
GFR calc Af Amer: 36 mL/min — ABNORMAL LOW (ref >60)
GFR calc non Af Amer: 31 mL/min — ABNORMAL LOW (ref >60)
Glucose, Bld: 205 mg/dL — ABNORMAL HIGH (ref 70–99)
Potassium: 4.1 mmol/L (ref 3.5–5.1)
Sodium: 141 mmol/L (ref 135–145)

## 2020-05-31 LAB — SEDIMENTATION RATE: Sed Rate: 65 mm/hr — ABNORMAL HIGH (ref 0–16)

## 2020-05-31 LAB — CBC
HCT: 28.9 % — ABNORMAL LOW (ref 39.0–52.0)
Hemoglobin: 8.5 g/dL — ABNORMAL LOW (ref 13.0–17.0)
MCH: 28.4 pg (ref 26.0–34.0)
MCHC: 29.4 g/dL — ABNORMAL LOW (ref 30.0–36.0)
MCV: 96.7 fL (ref 80.0–100.0)
Platelets: 96 10*3/uL — ABNORMAL LOW (ref 150–400)
RBC: 2.99 MIL/uL — ABNORMAL LOW (ref 4.22–5.81)
RDW: 14.4 % (ref 11.5–15.5)
WBC: 14.6 10*3/uL — ABNORMAL HIGH (ref 4.0–10.5)
nRBC: 0 % (ref 0.0–0.2)

## 2020-05-31 LAB — CULTURE, BLOOD (ROUTINE X 2): Culture: NO GROWTH

## 2020-05-31 LAB — C-REACTIVE PROTEIN: CRP: 24 mg/dL — ABNORMAL HIGH (ref <1.0)

## 2020-05-31 LAB — URIC ACID: Uric Acid, Serum: 8.7 mg/dL — ABNORMAL HIGH (ref 3.7–8.6)

## 2020-05-31 MED ORDER — TRAZODONE HCL 50 MG PO TABS
50.0000 mg | ORAL_TABLET | Freq: Once | ORAL | Status: AC
  Administered 2020-05-31: 50 mg via ORAL
  Filled 2020-05-31: qty 1

## 2020-05-31 MED ORDER — FENTANYL CITRATE (PF) 100 MCG/2ML IJ SOLN
12.5000 ug | INTRAMUSCULAR | Status: DC | PRN
  Administered 2020-05-31 – 2020-06-01 (×3): 12.5 ug via INTRAVENOUS
  Filled 2020-05-31 (×3): qty 2

## 2020-05-31 MED ORDER — SODIUM CHLORIDE 0.9% FLUSH
10.0000 mL | Freq: Two times a day (BID) | INTRAVENOUS | Status: DC
  Administered 2020-05-31: 20 mL
  Administered 2020-05-31: 30 mL

## 2020-05-31 MED ORDER — SODIUM CHLORIDE 0.9% FLUSH
10.0000 mL | INTRAVENOUS | Status: DC | PRN
  Administered 2020-06-03: 20 mL
  Administered 2020-06-04: 10 mL

## 2020-05-31 MED ORDER — DEXTROSE 5 % IV SOLN: INTRAVENOUS | Status: DC

## 2020-05-31 NOTE — Progress Notes (Addendum)
[facility]                                                                              Patient Demographics  [name], is a 74 y.o. male, DOB - 1946/02/01, ENI:[id number]  Admit date - 05/26/2020   Admitting Physician Harold Hedge, MD  Outpatient Primary MD for the patient is [name], MD  Outpatient specialists:   LOS - 5  days   Medical records reviewed and are as summarized below:    Chief Complaint  Patient presents with  . 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  Patient apparently awake but confused, complaining of right-sided neck and back pain. 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Tissue culture revealed rare gram.positive cocci, pansensitive Proteus. Patient was given Norco for pain control. Approximately 8 hours prior to admission to the ED patient was difficult to arouse. At baseline he is normally conversant and answers questions normally butwifeis concerned the pain medication (Norco) he took from his hand surgery made him delirious. ED Course: T100.66F,HR110, RR 20, BP 224/102, SPO2 93% on room air. Noted to be confused/belligerent and yelling,required chemical sedation Na149, K5.4, bicarb 12, glucose 152, BUN 74, creatinine 2.71, AG 15, AST 63, ALT 14, T bili 1.4, CK 1566, troponin 182->226, EKG normal sinus rhythm with nonspecific ST-T changes in antero-lateral leads with PVCs.  QTC 421 ms.  WBC 12.0, Hb 9.8, platelets 111, INR 1.5, UA negative, UDS positive for opiates.Trauma work-up in the ED (CT chest abdomen pelvis, thoracic/lumbar spine  x-rays) negative except for small pleural effusions bilaterally.He received cefepime, vancomycin, 1 L NS bolus,, labetalol 20 mg x 1, Geodon milligrams x1.   Assessment & Plan    Principal Problem: Acute metabolic encephalopathy, present on admission -  Likely multifactorial secondary to profound dehydration, hypernatremia, AKI and opiate use.  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  -  Appeared to be dehydration on admission, UA + ketones likely due to poor oral intake. - Urine drug screen positive for opiates which he was prescribed on last discharge.   -Tacrolimus level resulted as low normal at 2.2. -Sodium level improved, 141 today, will hold IV fluids -Alert and awake, difficult to obtain review of system from the patient  Acute metabolic acidosis, POA - Secondary to renal failure vs sepsis vs mild DKA - Labs showed beta hydroxybutyrate elevation at 4.35 on presentation raising possibility of mild DKA.  Patient was placed on IV antibiotics.  Vancomycin discontinued as MRSA PCR negative. -Blood cultures negative so far, urine cultures negative.  Still has leukocytosis, continue oral Augmentin  only  to cover Proteus from prior wound cultures, procalcitonin elevated at 6).   - will continue antibiotics today as leukocytosis trending up, if afebrile and improving, will DC antibiotics as per Dr. Fredna Dow, index finger looked good during dressing change.    Acute on chronic renal failure with hypernatremia, s/p renal transplant:  - Secondary to dehydration. Home diuretics (Lasix 80 mg twice daily) on hold.  - Seen by nephrology and started on bicarb supplemented D5W. -Renal function plateaued at 2.0, sodium trending down to 141 -IV fluids placed on hold  -  Resumed chronic immunosuppressants/transplant  medicines as able to take p.o. now.  Hypertensive urgency: - Patient presented with BP 224/102 in the setting of confusion and agitation.  BP now stable  - Continue oral beta-blockers, Nitropatch (can change to p.o.  Imdur).  Resumed Norvasc, continue hydralazine IV as needed   Rhabdomyolysis, elevated CK - Patient with CK level~1500.  Likely secondary to falls, being on the floor.  Patient was placed on IV fluid hydration  Elevated troponin - In the setting of renal failure and possible demand ischemia from uncontrolled blood pressure. - EKG with nonspecific ST-T changes.   -Echocardiogram shows preserved EF.  Cardiac monitoring with no significant arrhythmias   Chronic diastolic CHF:  - Repeat echo shows preserved EF. 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 Fredna Dow. - Supposed to have dressing change 9/9 in clinic per daughter.  Seen by Dr. Fredna Dow and recommended "ensure coban not placed too tight. If bandaids work better for maintaining wound coverage they may be used instead. Keep wound clean and dry. Encouraged gentle motion of other digits and hand to help with swelling. Okay to follow up after discharge. Contact if any concerns".   - pain on moving hands or wrists, knees, will check ESR, CRP, uric acid  Superficial skin tears on extremities, pressure injury, left lower arm posterior skin tears, present on admission - Wound care recommendations appreciated   Code Status: Full code DVT  Prophylaxis: Heparin Family Communication: Discussed all imaging results, lab results, explained to the patient    Disposition Plan:     Status is: Inpatient  Remains inpatient appropriate because:Inpatient level of care appropriate due to severity of illness   Dispo: The patient is from: Home              Anticipated d/c is to: SNF              Anticipated d/c date is: 2 days              Patient currently is not medically stable to d/c.      Time Spent in minutes   *35 minutes  Procedures:  None  Consultants:     Antimicrobials:   Anti-infectives (From admission, onward)   Start     Dose/Rate Route Frequency Ordered Stop   05/29/20 0100  amoxicillin-clavulanate (AUGMENTIN) 875-125 MG per tablet 1 tablet        1 tablet Oral Every 12 hours 05/29/20 0049 06/01/20 2359  05/29/20 0015  ciprofloxacin (CIPRO) tablet 500 mg  Status:  Discontinued        500 mg Oral 2 times daily 05/29/20 0004 05/29/20 0048   05/28/20 1800  ceFEPIme (MAXIPIME) 2 g in sodium chloride 0.9 % 100 mL IVPB  Status:  Discontinued        2 g 200 mL/hr over 30 Minutes Intravenous Every 12 hours 05/28/20 0950 05/28/20 1100   05/28/20 1400  ceFAZolin (ANCEF) IVPB 2g/100 mL premix  Status:  Discontinued        2 g 200 mL/hr over 30 Minutes Intravenous Every 8 hours 05/28/20 1100 05/29/20 0004   05/28/20 0800  vancomycin (VANCOREADY) IVPB 1500 mg/300 mL  Status:  Discontinued        1,500 mg 150 mL/hr over 120 Minutes Intravenous Every 48 hours 05/26/20 0943 05/27/20 1638   05/27/20 0600  ceFEPIme (MAXIPIME) 2 g in sodium chloride 0.9 % 100 mL IVPB  Status:  Discontinued        2 g 200 mL/hr over 30 Minutes Intravenous Every 24 hours 05/26/20 0935 05/28/20 0950   05/26/20 1015  ceFEPIme (MAXIPIME) 2 g in sodium chloride 0.9 % 100 mL IVPB  Status:  Discontinued        2 g 200 mL/hr over 30 Minutes Intravenous  Once 05/26/20 1004 05/26/20 1011   05/26/20 1015  metroNIDAZOLE (FLAGYL) IVPB 500 mg  Status:   Discontinued        500 mg 100 mL/hr over 60 Minutes Intravenous Every 8 hours 05/26/20 1004 05/28/20 1100   05/26/20 1015  vancomycin (VANCOCIN) IVPB 1000 mg/200 mL premix  Status:  Discontinued        1,000 mg 200 mL/hr over 60 Minutes Intravenous  Once 05/26/20 1004 05/26/20 1012   05/26/20 0515  ceFEPIme (MAXIPIME) 1 g in sodium chloride 0.9 % 100 mL IVPB  Status:  Discontinued        1 g 200 mL/hr over 30 Minutes Intravenous  Once 05/26/20 0502 05/26/20 0506   05/26/20 0515  ceFEPIme (MAXIPIME) 2 g in sodium chloride 0.9 % 100 mL IVPB        2 g 200 mL/hr over 30 Minutes Intravenous  Once 05/26/20 0506 05/26/20 0701   05/26/20 0515  vancomycin (VANCOREADY) IVPB 1750 mg/350 mL        1,750 mg 175 mL/hr over 120 Minutes Intravenous STAT 05/26/20 0507 05/26/20 0834          Medications  Scheduled Meds: . amLODipine  10 mg Oral Daily  . amoxicillin-clavulanate  1 tablet Oral Q12H  . atorvastatin  10 mg Oral Daily  . carvedilol  6.25 mg Oral BID WC  . Chlorhexidine Gluconate Cloth  6 each Topical Q0600  . heparin  5,000 Units Subcutaneous Q8H  . insulin aspart  0-15 Units Subcutaneous TID WC  . insulin aspart  0-5 Units Subcutaneous QHS  . insulin aspart  3 Units Subcutaneous TID WC  . insulin glargine  15 Units Subcutaneous QHS  . mouth rinse  15 mL Mouth Rinse BID  . mycophenolate  500 mg Oral BID  . nitroGLYCERIN  0.1 mg Transdermal Daily  . predniSONE  5 mg Oral Q breakfast  . sodium chloride flush  3 mL Intravenous Q12H  . tacrolimus  1 mg Oral BID   Continuous Infusions: . sodium chloride 250 mL (05/31/20 0754)  . dextrose 50 mL/hr at 05/31/20 0848   PRN Meds:.sodium chloride, albuterol, fentaNYL (SUBLIMAZE) injection, hydrALAZINE, hydrALAZINE,  metoprolol tartrate, oxyCODONE-acetaminophen      Subjective:   Drexler Maland was seen and examined today.  Still appears to be somewhat disoriented, overall awake and following commands.  BP stable.  Grimaces on  touching his arms, ?  Pain No acute chest pain, shortness of breath, abdominal pain, nausea vomiting or diarrhea.  No acute events overnight.    Objective:   Vitals:   05/31/20 1000 05/31/20 1100 05/31/20 1200 05/31/20 1300  BP: 139/66 (!) 126/54 (!) 132/39 (!) 119/52  Pulse: 78 77 78 77  Resp: 17 (!) 23 18 (!) 23  Temp:      TempSrc:      SpO2: 93% 97% 97% 95%  Weight:      Height:        Intake/Output Summary (Last 24 hours) at 05/31/2020 1336 Last data filed at 05/31/2020 0848 Gross per 24 hour  Intake 2493 ml  Output 850 ml  Net 1643 ml     Wt Readings from Last 3 Encounters:  05/31/20 85.9 kg  05/22/20 88.9 kg  04/09/20 90.3 kg     Exam  General: Alert and awake, NAD  Cardiovascular: S1 S2 auscultated, no murmurs, RRR  Respiratory: Clear to auscultation bilaterally, no wheezing, rales or rhonchi  Gastrointestinal: Soft, nontender, nondistended, + bowel sounds  Ext: no pedal edema bilaterally  Neuro: moving all extremities however does not follow commands  Musculoskeletal: No digital cyanosis, clubbing  Skin: Dressing in place on the right index finger, superficial skin tears on upper extremities, dressings in place, in heel protectors  Psych: still confused   Data Reviewed:  I have personally reviewed following labs and imaging studies  Micro Results Recent Results (from the past 240 hour(s))  Aerobic/Anaerobic Culture (surgical/deep wound)     Status: None   Collection Time: 05/22/20  9:15 AM   Specimen: PATH Soft tissue; Wound  Result Value Ref Range Status   Specimen Description WOUND  Final   Special Requests RIGHT INDEX FINGER SPEC A  Final   Gram Stain   Final    RARE WBC PRESENT, PREDOMINANTLY PMN RARE GRAM POSITIVE COCCI    Culture   Final    ABUNDANT PROTEUS MIRABILIS NO ANAEROBES ISOLATED Performed at [facility], [address] [place], [place] [address]    Report Status 05/27/2020 FINAL  Final   Organism ID, Bacteria  PROTEUS MIRABILIS  Final      Susceptibility   Proteus mirabilis - MIC*    AMPICILLIN <=2 SENSITIVE Sensitive     CEFAZOLIN <=4 SENSITIVE Sensitive     CEFEPIME <=0.12 SENSITIVE Sensitive     CEFTAZIDIME <=1 SENSITIVE Sensitive     CEFTRIAXONE <=0.25 SENSITIVE Sensitive     CIPROFLOXACIN <=0.25 SENSITIVE Sensitive     GENTAMICIN <=1 SENSITIVE Sensitive     IMIPENEM 2 SENSITIVE Sensitive     TRIMETH/SULFA <=20 SENSITIVE Sensitive     AMPICILLIN/SULBACTAM <=2 SENSITIVE Sensitive     PIP/TAZO <=4 SENSITIVE Sensitive     * ABUNDANT PROTEUS MIRABILIS  Blood culture (routine x 2)     Status: None   Collection Time: 05/26/20  4:42 AM   Specimen: BLOOD  Result Value Ref Range Status   Specimen Description   Final    BLOOD RIGHT HAND Performed at [facility] [address] [place], [place] [address]    Special Requests   Final    BOTTLES DRAWN AEROBIC AND ANAEROBIC Blood Culture results may not be optimal due  to an inadequate volume of blood received in culture bottles Performed at [facility] [address] [place], [place] [address]    Culture   Final    NO GROWTH 5 DAYS Performed at [facility], [place] [address] [place], [place] [address]    Report Status 05/31/2020 FINAL  Final  Urine Culture     Status: Abnormal   Collection Time: 05/26/20  4:42 AM   Specimen: Urine, Random  Result Value Ref Range Status   Specimen Description   Final    URINE, RANDOM Performed at [place] [address], [place], [place] [address]    Special Requests   Final    NONE Performed at [facility], [place] [address] [place], [place] [address]    Culture (A)  Final    <10,000 COLONIES/mL INSIGNIFICANT GROWTH Performed at [facility] [address] [place], [place] [address]    Report Status 05/27/2020 FINAL  Final  SARS Coronavirus 2 by RT PCR (hospital order, performed in [facility] hospital  lab) Nasopharyngeal Nasopharyngeal Swab     Status: None   Collection Time: 05/26/20  4:43 AM   Specimen: Nasopharyngeal Swab  Result Value Ref Range Status   SARS Coronavirus 2 NEGATIVE NEGATIVE Final    Comment: (NOTE) SARS-CoV-2 target nucleic acids are NOT DETECTED.  The SARS-CoV-2 RNA is generally detectable in upper and lower respiratory specimens during the acute phase of infection. The lowest concentration of SARS-CoV-2 viral copies this assay can detect is 250 copies / mL. A negative result does not preclude SARS-CoV-2 infection and should not be used as the sole basis for treatment or other patient management decisions.  A negative result may occur with improper specimen collection / handling, submission of specimen other than nasopharyngeal swab, presence of viral mutation(s) within the areas targeted by this assay, and inadequate number of viral copies (<250 copies / mL). A negative result must be combined with clinical observations, patient history, and epidemiological information.  Fact Sheet for Patients:   StrictlyIdeas.no  Fact Sheet for Healthcare Providers: BankingDealers.co.za  This test is not yet approved or  cleared by the [place] FDA and has been authorized for detection and/or diagnosis of SARS-CoV-2 by FDA under an Emergency Use Authorization (EUA).  This EUA will remain in effect (meaning this test can be used) for the duration of the COVID-19 declaration under Section 564(b)(1) of the Act, 21 U.S.C. section 360bbb-3(b)(1), unless the authorization is terminated or revoked sooner.  Performed at [facility], [place] [address], [place], [place] [address]   MRSA PCR Screening     Status: None   Collection Time: 05/27/20 10:05 AM   Specimen: Nasopharyngeal  Result Value Ref Range Status   MRSA by PCR NEGATIVE NEGATIVE Final    Comment:        The GeneXpert MRSA Assay (FDA approved  for NASAL specimens only), is one component of a comprehensive MRSA colonization surveillance program. It is not intended to diagnose MRSA infection nor to guide or monitor treatment for MRSA infections. Performed at [facility], [place] [address] [place],  [address]     Radiology Reports CT ABDOMEN PELVIS WO CONTRAST  Result Date: 05/26/2020 CLINICAL DATA:  Fall, found down EXAM: CT CHEST, ABDOMEN AND PELVIS WITHOUT CONTRAST TECHNIQUE: Multidetector CT imaging of the chest, abdomen and pelvis was performed following the standard protocol without IV contrast. COMPARISON:  None. FINDINGS:  Motion degraded images. CT CHEST FINDINGS Cardiovascular: Cardiomegaly. No pericardial effusion. Chronic pericardial calcifications. No evidence of thoracic aortic aneurysm. Atherosclerotic calcifications of the aortic arch. Enlargement of the main pulmonary artery, suggesting pulmonary arterial hypertension. Three vessel coronary atherosclerosis. Mediastinum/Nodes: No suspicious mediastinal lymphadenopathy. Visualized thyroid is unremarkable. Lungs/Pleura: Evaluation of the lung parenchyma is constrained by respiratory motion. Within that constraint, there are no suspicious pulmonary nodules. Small right and trace left pleural effusions. Mild right basilar atelectasis.  No focal consolidation. No pneumothorax. Musculoskeletal: No acute fracture is seen. Specifically, the sternum, clavicles, and scapulae are intact. Ribs are poorly evaluated due to motion degradation. Old left rib fracture deformities without definite acute fracture. Dedicated thoracic spine evaluation will be reported separately. CT ABDOMEN PELVIS FINDINGS Hepatobiliary: Unenhanced liver is grossly unremarkable. No perihepatic fluid/hemorrhage. Suspected layering gallbladder sludge (series 2/image 25), without associated inflammatory changes. No intrahepatic or extrahepatic ductal dilatation. Pancreas: Within normal limits.  Spleen: Within normal limits.  No perisplenic fluid/hemorrhage. Adrenals/Urinary Tract: Adrenal glands are within normal limits. Severe bilateral renal cortical atrophy with a dominant 3.6 cm right upper pole renal cyst. No hydronephrosis. Calcified, amorphous right lower quadrant renal transplant with sequela of rejection (series 2/image 54). Left lower quadrant renal transplant without hydronephrosis. Bladder is within normal limits. Stomach/Bowel: Stomach is within normal limits. No evidence of bowel obstruction. Normal appendix (series 2/image 22). Extensive left colonic diverticulosis, without evidence of diverticulitis. Vascular/Lymphatic: No evidence of abdominal aortic aneurysm. Extensive atherosclerotic calcifications the abdominal aorta and branch vessels. No suspicious abdominopelvic lymphadenopathy. Reproductive: Prostate is grossly unremarkable. Other: No abdominopelvic ascites. No hemoperitoneum or free air. Musculoskeletal: No fracture is seen. Specifically, the visualized bony pelvis and proximal femurs are intact. Dedicated lumbar spine evaluation will be reported separately. IMPRESSION: Motion degraded images. No evidence of traumatic injury to the chest, abdomen, or pelvis. Dedicated thoracolumbar spine evaluation will be reported separately. Small right and trace left pleural effusions. Additional chronic ancillary findings as above. Electronically Signed   By: Julian Hy M.D.   On: 05/26/2020 06:41   DG Chest 1 View  Result Date: 05/26/2020 CLINICAL DATA:  Altered mental status EXAM: CHEST  1 VIEW COMPARISON:  09/06/2018 FINDINGS: Moderate cardiomegaly. Hazy opacities in the right lung may be exaggerated by rotation. Right pleural effusion. IMPRESSION: Right pleural effusion and moderate cardiomegaly. Electronically Signed   By: Ulyses Jarred M.D.   On: 05/26/2020 06:48   DG Pelvis 1-2 Views  Result Date: 05/26/2020 CLINICAL DATA:  Fall found down EXAM: PELVIS - 1-2 VIEW COMPARISON:   None. FINDINGS: There is no evidence of pelvic fracture or diastasis. No pelvic bone lesions are seen. IMPRESSION: Negative. Electronically Signed   By: Ulyses Jarred M.D.   On: 05/26/2020 06:49   DG Forearm Right  Result Date: 05/26/2020 CLINICAL DATA:  Right forearm injury after fall. EXAM: RIGHT FOREARM - 2 VIEW COMPARISON:  None. FINDINGS: There is no evidence of fracture or other focal bone lesions. Vascular calcifications are noted. Postsurgical changes are noted in the antecubital region. IMPRESSION: Negative. Electronically Signed   By: Marijo Conception M.D.   On: 05/26/2020 09:15   CT Head Wo Contrast  Result Date: 05/26/2020 CLINICAL DATA:  Fall EXAM: CT HEAD WITHOUT CONTRAST CT CERVICAL SPINE WITHOUT CONTRAST TECHNIQUE: Multidetector CT imaging of the head and cervical spine was performed following the standard protocol without intravenous contrast. Multiplanar CT image reconstructions of the cervical spine were also generated. COMPARISON:  None. FINDINGS: CT HEAD FINDINGS Brain: There is  no mass, hemorrhage or extra-axial collection. The size and configuration of the ventricles and extra-axial CSF spaces are normal. There is hypoattenuation of the periventricular white matter, most commonly indicating chronic ischemic microangiopathy. Vascular: Atherosclerotic calcification of the vertebral and internal carotid arteries at the skull base. No abnormal hyperdensity of the major intracranial arteries or dural venous sinuses. Skull: Small hematoma at the scalp vertex.  No skull fracture. Sinuses/Orbits: No fluid levels or advanced mucosal thickening of the visualized paranasal sinuses. No mastoid or middle ear effusion. The orbits are normal. CT CERVICAL SPINE FINDINGS Alignment: No static subluxation. Facets are aligned. Occipital condyles are normally positioned. Skull base and vertebrae: No acute fracture. Soft tissues and spinal canal: No prevertebral fluid or swelling. No visible canal hematoma.  Disc levels: C5-7 degenerative disc disease. Upper chest: No pneumothorax, pulmonary nodule or pleural effusion. Other: Normal visualized paraspinal cervical soft tissues. IMPRESSION: 1. 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 2. Small scalp vertex hematoma without skull fracture. 3. No acute fracture or static subluxation of the cervical spine. Electronically Signed   By: Ulyses Jarred M.D.   On: 05/26/2020 06:44   CT Chest Wo Contrast  Result Date: 05/26/2020 CLINICAL DATA:  Fall, found down EXAM: CT CHEST, ABDOMEN AND PELVIS WITHOUT CONTRAST TECHNIQUE: Multidetector CT imaging of the chest, abdomen and pelvis was performed following the standard protocol without IV contrast. COMPARISON:  None. FINDINGS: Motion degraded images. CT CHEST FINDINGS Cardiovascular: Cardiomegaly. No pericardial effusion. Chronic pericardial calcifications. No evidence of thoracic aortic aneurysm. Atherosclerotic calcifications of the aortic arch. Enlargement of the main pulmonary artery, suggesting pulmonary arterial hypertension. Three vessel coronary atherosclerosis. Mediastinum/Nodes: No suspicious mediastinal lymphadenopathy. Visualized thyroid is unremarkable. Lungs/Pleura: Evaluation of the lung parenchyma is constrained by respiratory motion. Within that constraint, there are no suspicious pulmonary nodules. Small right and trace left pleural effusions. Mild right basilar atelectasis.  No focal consolidation. No pneumothorax. Musculoskeletal: No acute fracture is seen. Specifically, the sternum, clavicles, and scapulae are intact. Ribs are poorly evaluated due to motion degradation. Old left rib fracture deformities without definite acute fracture. Dedicated thoracic spine evaluation will be reported separately. CT ABDOMEN PELVIS FINDINGS Hepatobiliary: Unenhanced liver is grossly unremarkable. No perihepatic fluid/hemorrhage. Suspected layering gallbladder sludge (series 2/image 25), without  associated inflammatory changes. No intrahepatic or extrahepatic ductal dilatation. Pancreas: Within normal limits. Spleen: Within normal limits.  No perisplenic fluid/hemorrhage. Adrenals/Urinary Tract: Adrenal glands are within normal limits. Severe bilateral renal cortical atrophy with a dominant 3.6 cm right upper pole renal cyst. No hydronephrosis. Calcified, amorphous right lower quadrant renal transplant with sequela of rejection (series 2/image 54). Left lower quadrant renal transplant without hydronephrosis. Bladder is within normal limits. Stomach/Bowel: Stomach is within normal limits. No evidence of bowel obstruction. Normal appendix (series 2/image 22). Extensive left colonic diverticulosis, without evidence of diverticulitis. Vascular/Lymphatic: No evidence of abdominal aortic aneurysm. Extensive atherosclerotic calcifications the abdominal aorta and branch vessels. No suspicious abdominopelvic lymphadenopathy. Reproductive: Prostate is grossly unremarkable. Other: No abdominopelvic ascites. No hemoperitoneum or free air. Musculoskeletal: No fracture is seen. Specifically, the visualized bony pelvis and proximal femurs are intact. Dedicated lumbar spine evaluation will be reported separately. IMPRESSION: Motion degraded images. No evidence of traumatic injury to the chest, abdomen, or pelvis. Dedicated thoracolumbar spine evaluation will be reported separately. Small right and trace left pleural effusions. Additional chronic ancillary findings as above. Electronically Signed   By: Julian Hy M.D.   On: 05/26/2020 06:41   CT Cervical Spine Wo Contrast  Result Date: 05/26/2020 CLINICAL DATA:  Fall EXAM: CT HEAD WITHOUT CONTRAST CT CERVICAL SPINE WITHOUT CONTRAST TECHNIQUE: Multidetector CT imaging of the head and cervical spine was performed following the standard protocol without intravenous contrast. Multiplanar CT image reconstructions of the cervical spine were also generated. COMPARISON:   None. FINDINGS: CT HEAD FINDINGS Brain: There is no mass, hemorrhage or extra-axial collection. The size and configuration of the ventricles and extra-axial CSF spaces are normal. There is hypoattenuation of the periventricular white matter, most commonly indicating chronic ischemic microangiopathy. Vascular: Atherosclerotic calcification of the vertebral and internal carotid arteries at the skull base. No abnormal hyperdensity of the major intracranial arteries or dural venous sinuses. Skull: Small hematoma at the scalp vertex.  No skull fracture. Sinuses/Orbits: No fluid levels or advanced mucosal thickening of the visualized paranasal sinuses. No mastoid or middle ear effusion. The orbits are normal. CT CERVICAL SPINE FINDINGS Alignment: No static subluxation. Facets are aligned. Occipital condyles are normally positioned. Skull base and vertebrae: No acute fracture. Soft tissues and spinal canal: No prevertebral fluid or swelling. No visible canal hematoma. Disc levels: C5-7 degenerative disc disease. Upper chest: No pneumothorax, pulmonary nodule or pleural effusion. Other: Normal visualized paraspinal cervical soft tissues. IMPRESSION: 1. 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 2. Small scalp vertex hematoma without skull fracture. 3. No acute fracture or static subluxation of the cervical spine. Electronically Signed   By: Ulyses Jarred M.D.   On: 05/26/2020 06:44   MR BRAIN WO CONTRAST  Result Date: 05/28/2020 CLINICAL DATA:  Delirium EXAM: MRI HEAD WITHOUT CONTRAST TECHNIQUE: Multiplanar, multiecho pulse sequences of the brain and surrounding structures were obtained without intravenous contrast. COMPARISON:  None. FINDINGS: Motion artifact is present. Brain: There is no acute infarction or intracranial hemorrhage. There is no intracranial mass, mass effect, or edema. There is no hydrocephalus or extra-axial fluid collection. Small chronic left occipital infarct. Minimal  patchy T2 hyperintensity in the supratentorial white matter is nonspecific but may reflect minor chronic microvascular ischemic changes. Prominence of the ventricles and sulci reflects generalized parenchymal volume loss. Vascular: Major vessel flow voids at the skull base are preserved. Skull and upper cervical spine: Normal marrow signal is preserved. Sinuses/Orbits: Paranasal sinuses are aerated. Orbits are unremarkable. Other: Sella is unremarkable.  Mastoid air cells are clear. IMPRESSION: Motion degraded study. No evidence of recent infarction, hemorrhage, or mass. Small chronic left occipital infarct. Electronically Signed   By: Macy Mis M.D.   On: 05/28/2020 16:27   CT T-SPINE NO CHARGE  Result Date: 05/26/2020 CLINICAL DATA:  Fall EXAM: CT Thoracic and Lumbar spine without contrast TECHNIQUE: Multiplanar CT images of the thoracic and lumbar spine were reconstructed from contemporary CT of the Chest, Abdomen, and Pelvis CONTRAST:  No additional contrast COMPARISON:  None FINDINGS: CT THORACIC SPINE FINDINGS Alignment: Normal. Vertebrae: No acute fracture or focal pathologic process. Disc levels: No spinal canal stenosis CT LUMBAR SPINE FINDINGS Segmentation: 5 lumbar type vertebrae. Alignment: Normal. Vertebrae: No acute fracture or focal pathologic process. Paraspinal and other soft tissues: Negative. Disc levels: No spinal canal stenosis. IMPRESSION: No acute fracture or static subluxation of the thoracic or lumbar spine. Electronically Signed   By: Ulyses Jarred M.D.   On: 05/26/2020 06:35   CT L-SPINE NO CHARGE  Result Date: 05/26/2020 CLINICAL DATA:  Fall EXAM: CT Thoracic and Lumbar spine without contrast TECHNIQUE: Multiplanar CT images of the thoracic and lumbar spine were reconstructed from contemporary CT of the Chest, Abdomen, and Pelvis  CONTRAST:  No additional contrast COMPARISON:  None FINDINGS: CT THORACIC SPINE FINDINGS Alignment: Normal. Vertebrae: No acute fracture or focal  pathologic process. Disc levels: No spinal canal stenosis CT LUMBAR SPINE FINDINGS Segmentation: 5 lumbar type vertebrae. Alignment: Normal. Vertebrae: No acute fracture or focal pathologic process. Paraspinal and other soft tissues: Negative. Disc levels: No spinal canal stenosis. IMPRESSION: No acute fracture or static subluxation of the thoracic or lumbar spine. Electronically Signed   By: Ulyses Jarred M.D.   On: 05/26/2020 06:35   DG CHEST PORT 1 VIEW  Result Date: 05/29/2020 CLINICAL DATA:  Central line placement EXAM: PORTABLE CHEST 1 VIEW.  Patient is slightly rotated. COMPARISON:  Chest x-ray 05/26/2020, CT chest 05/26/2020. FINDINGS: Interval placement of a left internal jugular central venous catheter with tip overlying the expected region of the superior cavoatrial junction. Moderate cardiomegaly the may be exaggerated due to portable AP technique. The heart size and mediastinal contours are unchanged. Aortic arch calcification. Hilar vasculature prominence. Improved aeration of bilateral lungs. No focal consolidation. No pulmonary edema. Persistent bilateral trace pleural effusions. No pneumothorax. No acute osseous abnormality.  Old healed left rib fractures. IMPRESSION: New left internal jugular central venous catheter with tip overlying the expected region of the superior cavoatrial junction. Persistent bilateral trace pleural effusions in a patient with moderate cardiomegaly. Electronically Signed   By: Iven Finn M.D.   On: 05/29/2020 13:01   DG Hand Complete Right  Result Date: 05/26/2020 CLINICAL DATA:  Right hand pain after fall. EXAM: RIGHT HAND - COMPLETE 3+ VIEW COMPARISON:  None. 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 Vascular calcifications are noted. No other bony abnormality is noted. Joint spaces are intact. IMPRESSION: Status post amputation of a portion of second middle phalanx as well as the second  distal phalanx. Electronically Signed   By: Marijo Conception M.D.   On: 05/26/2020 09:13   ECHOCARDIOGRAM COMPLETE  Result Date: 05/26/2020    ECHOCARDIOGRAM REPORT   Patient Name:   ALIXANDER RALLIS Date of Exam: 05/26/2020 Medical Rec #:  [id number]       Height:       69.0 in Accession #:    [id number]      Weight:       196.0 lb Date of Birth:  [birth year]      BSA:          2.048 m Patient Age:    54 years        BP:           161/69 mmHg Patient Gender: M               HR:           74 bpm. Exam Location:  Inpatient Procedure: 2D Echo, Cardiac Doppler and Color Doppler Indications:    CHF-Acute Diastolic 742.59 / D63.87  History:        Patient has prior history of Echocardiogram examinations, most                 recent 03/01/2019. CHF, Arrythmias:Atrial Fibrillation; Risk                 Factors:Dyslipidemia, Hypertension, Diabetes and Non-Smoker.  Sonographer:    Vickie Epley RDCS Referring Phys: [id number] [place]  1. Left ventricular ejection fraction, by estimation, is 55 to 60%. The left ventricle has normal function. The left ventricle has no  regional wall motion abnormalities. Left ventricular diastolic parameters are indeterminate. Elevated left ventricular end-diastolic pressure.  2. Right ventricular systolic function is normal. The right ventricular size is normal. Tricuspid regurgitation signal is inadequate for assessing PA pressure.  3. The mitral valve is normal in structure. No evidence of mitral valve regurgitation. No evidence of mitral stenosis.  4. The aortic valve is tricuspid. Aortic valve regurgitation is trivial. Mild aortic valve sclerosis is present, with no evidence of aortic valve stenosis.  5. There is mild dilatation of the aortic root measuring 42 mm.  6. The inferior vena cava is dilated in size with <50% respiratory variability, suggesting right atrial pressure of 15 mmHg. FINDINGS  Left Ventricle: Left ventricular ejection fraction, by estimation, is 55 to 60%.  The left ventricle has normal function. The left ventricle has no regional wall motion abnormalities. The left ventricular internal cavity size was normal in size. There is  no left ventricular hypertrophy. Left ventricular diastolic parameters are indeterminate. Elevated left ventricular end-diastolic pressure. Right Ventricle: The right ventricular size is normal. No increase in right ventricular wall thickness. Right ventricular systolic function is normal. Tricuspid regurgitation signal is inadequate for assessing PA pressure. Left Atrium: Left atrial size was normal in size. Right Atrium: Right atrial size was normal in size. Pericardium: There is no evidence of pericardial effusion. Mitral Valve: The mitral valve is normal in structure. Normal mobility of the mitral valve leaflets. Mild mitral annular calcification. No evidence of mitral valve regurgitation. No evidence of mitral valve stenosis. Tricuspid Valve: The tricuspid valve is normal in structure. Tricuspid valve regurgitation is trivial. No evidence of tricuspid stenosis. Aortic Valve: The aortic valve is tricuspid. Aortic valve regurgitation is trivial. Mild aortic valve sclerosis is present, with no evidence of aortic valve stenosis. Pulmonic Valve: The pulmonic valve was normal in structure. Pulmonic valve regurgitation is not visualized. No evidence of pulmonic stenosis. Aorta: The aortic root is normal in size and structure. There is mild dilatation of the aortic root measuring 42 mm. Venous: The inferior vena cava is dilated in size with less than 50% respiratory variability, suggesting right atrial pressure of 15 mmHg. IAS/Shunts: No atrial level shunt detected by color flow Doppler.  LEFT VENTRICLE PLAX 2D LVIDd:         4.70 cm      Diastology LVIDs:         3.30 cm      LV e' lateral:   11.10 cm/s LV PW:         1.00 cm      LV E/e' lateral: 12.1 LV IVS:        1.00 cm      LV e' medial:    7.73 cm/s LVOT diam:     2.80 cm      LV E/e'  medial:  17.3 LV SV:         100 LV SV Index:   49 LVOT Area:     6.16 cm  LV Volumes (MOD) LV vol d, MOD A2C: 103.0 ml LV vol d, MOD A4C: 119.0 ml LV vol s, MOD A2C: 43.4 ml LV vol s, MOD A4C: 56.6 ml LV SV MOD A2C:     59.6 ml LV SV MOD A4C:     119.0 ml LV SV MOD BP:      61.0 ml RIGHT VENTRICLE RV S prime:     10.60 cm/s TAPSE (M-mode): 1.2 cm LEFT ATRIUM  Index       RIGHT ATRIUM           Index LA diam:        3.60 cm 1.76 cm/m  RA Area:     17.90 cm LA Vol (A2C):   69.0 ml 33.69 ml/m RA Volume:   53.10 ml  25.92 ml/m LA Vol (A4C):   54.5 ml 26.61 ml/m LA Biplane Vol: 67.1 ml 32.76 ml/m  AORTIC VALVE LVOT Vmax:   87.20 cm/s LVOT Vmean:  57.700 cm/s LVOT VTI:    0.162 m  AORTA Ao Root diam: 4.20 cm MITRAL VALVE MV Area (PHT): 3.48 cm     SHUNTS MV Decel Time: 218 msec     Systemic VTI:  0.16 m MV E velocity: 134.00 cm/s  Systemic Diam: 2.80 cm MV A velocity: 41.60 cm/s MV E/A ratio:  3.22 Fransico Him MD Electronically signed by Fransico Him MD Signature Date/Time: 05/26/2020/12:12:24 PM    Final     Lab Data:  CBC: Recent Labs  Lab 05/27/20 0410 05/28/20 0302 05/29/20 0249 05/30/20 1110 05/31/20 0653  WBC 10.1 12.5* 11.8* 11.6* 14.6*  HGB 8.6* 9.5* 9.3* 9.1* 8.5*  HCT 29.4* 31.9* 31.1* 30.1* 28.9*  MCV 97.7 95.8 96.6 95.9 96.7  PLT 90* 122* 120* 107* 96*   Basic Metabolic Panel: Recent Labs  Lab 05/26/20 0844 05/26/20 1722 05/28/20 0302 05/29/20 0249 05/29/20 2048 05/30/20 1110 05/31/20 0653  NA  --    < > 152* 149* 147* 145 141  K  --    < > 4.6 3.6 4.3 3.9 4.1  CL  --    < > 119* 120* 116* 111 106  CO2  --    < > 19* 22 24 24 23   GLUCOSE  --    < > 313* 172* 235* 151* 205*  BUN  --    < > 53* 54* 49* 46* 45*  CREATININE  --    < > 2.03* 2.00*  2.06* 1.95* 1.86* 2.08*  CALCIUM  --    < > 9.2 9.0 8.9 8.6* 8.4*  MG 2.2  --   --   --  1.8  --   --   PHOS  --   --  2.7 1.8*  --  2.2*  --    < > = values in this interval not displayed.   GFR: Estimated  Creatinine Clearance: 34.9 mL/min (A) (by C-G formula based on SCr of 2.08 mg/dL (H)). Liver Function Tests: Recent Labs  Lab 05/26/20 0441 05/27/20 0410 05/28/20 0302 05/29/20 0249 05/30/20 1110  AST 63* 57*  --   --   --   ALT 14 16  --   --   --   ALKPHOS 71 59  --   --   --   BILITOT 1.4* 1.2  --   --   --   PROT 7.0 5.9*  --   --   --   ALBUMIN 3.9 3.3* 3.2* 3.0* 2.9*   Recent Labs  Lab 05/26/20 0441  LIPASE 26   Recent Labs  Lab 05/26/20 0443  AMMONIA 27   Coagulation Profile: Recent Labs  Lab 05/26/20 0441  INR 1.5*   Cardiac Enzymes: Recent Labs  Lab 05/26/20 0441  CKTOTAL 1,566*   BNP (last 3 results) No results for input(s): PROBNP in the last 8760 hours. HbA1C: No results for input(s): HGBA1C in the last 72 hours. CBG: Recent Labs  Lab 05/30/20 1231 05/30/20  1718 05/30/20 2103 05/31/20 0743 05/31/20 1130  GLUCAP 141* 162* 165* 189* 244*   Lipid Profile: No results for input(s): CHOL, HDL, LDLCALC, TRIG, CHOLHDL, LDLDIRECT in the last 72 hours. Thyroid Function Tests: No results for input(s): TSH, T4TOTAL, FREET4, T3FREE, THYROIDAB in the last 72 hours. Anemia Panel: No results for input(s): VITAMINB12, FOLATE, FERRITIN, TIBC, IRON, RETICCTPCT in the last 72 hours. Urine analysis:    Component Value Date/Time   COLORURINE YELLOW 05/26/2020 0441   APPEARANCEUR CLEAR 05/26/2020 0441   LABSPEC 1.020 05/26/2020 0441   PHURINE 5.5 05/26/2020 0441   GLUCOSEU NEGATIVE 05/26/2020 0441   HGBUR LARGE (A) 05/26/2020 0441   BILIRUBINUR NEGATIVE 05/26/2020 0441   KETONESUR 40 (A) 05/26/2020 0441   PROTEINUR 100 (A) 05/26/2020 0441   NITRITE NEGATIVE 05/26/2020 0441   LEUKOCYTESUR NEGATIVE 05/26/2020 0441     Heydy Montilla M.D. [facility] 05/31/2020, 1:36 PM   Call night coverage person covering after 7pm

## 2020-05-31 NOTE — Progress Notes (Addendum)
Patient is hard to mobilize as he screams in pain even when mobilizing hand digits. Patient will stiffen body limbs and refuses to be helped in sitting up. Patient appears to only be alert to self.   Swelling was noted in the patient knees. Redness noted in digitalis joints of right hand.

## 2020-06-01 LAB — CBC
HCT: 24.7 % — ABNORMAL LOW (ref 39.0–52.0)
Hemoglobin: 7.4 g/dL — ABNORMAL LOW (ref 13.0–17.0)
MCH: 28.6 pg (ref 26.0–34.0)
MCHC: 30 g/dL (ref 30.0–36.0)
MCV: 95.4 fL (ref 80.0–100.0)
Platelets: 81 10*3/uL — ABNORMAL LOW (ref 150–400)
RBC: 2.59 MIL/uL — ABNORMAL LOW (ref 4.22–5.81)
RDW: 14 % (ref 11.5–15.5)
WBC: 13 10*3/uL — ABNORMAL HIGH (ref 4.0–10.5)
nRBC: 0 % (ref 0.0–0.2)

## 2020-06-01 LAB — GLUCOSE, CAPILLARY
Glucose-Capillary: 140 mg/dL — ABNORMAL HIGH (ref 70–99)
Glucose-Capillary: 208 mg/dL — ABNORMAL HIGH (ref 70–99)
Glucose-Capillary: 242 mg/dL — ABNORMAL HIGH (ref 70–99)
Glucose-Capillary: 272 mg/dL — ABNORMAL HIGH (ref 70–99)

## 2020-06-01 LAB — BASIC METABOLIC PANEL
Anion gap: 11 (ref 5–15)
BUN: 59 mg/dL — ABNORMAL HIGH (ref 8–23)
CO2: 20 mmol/L — ABNORMAL LOW (ref 22–32)
Calcium: 8.2 mg/dL — ABNORMAL LOW (ref 8.9–10.3)
Chloride: 103 mmol/L (ref 98–111)
Creatinine, Ser: 2.65 mg/dL — ABNORMAL HIGH (ref 0.61–1.24)
GFR calc Af Amer: 27 mL/min — ABNORMAL LOW (ref >60)
GFR calc non Af Amer: 23 mL/min — ABNORMAL LOW (ref >60)
Glucose, Bld: 221 mg/dL — ABNORMAL HIGH (ref 70–99)
Potassium: 4.5 mmol/L (ref 3.5–5.1)
Sodium: 134 mmol/L — ABNORMAL LOW (ref 135–145)

## 2020-06-01 MED ORDER — PREDNISONE 20 MG PO TABS
40.0000 mg | ORAL_TABLET | Freq: Every day | ORAL | Status: AC
  Administered 2020-06-01 – 2020-06-05 (×5): 40 mg via ORAL
  Filled 2020-06-01 (×5): qty 2

## 2020-06-01 MED ORDER — COLCHICINE 0.6 MG PO TABS
0.6000 mg | ORAL_TABLET | Freq: Every day | ORAL | Status: DC
  Administered 2020-06-01: 0.6 mg via ORAL
  Filled 2020-06-01: qty 1

## 2020-06-01 MED ORDER — PREDNISONE 5 MG PO TABS
10.0000 mg | ORAL_TABLET | Freq: Once | ORAL | Status: AC
  Administered 2020-06-01: 10 mg via ORAL
  Filled 2020-06-01: qty 2

## 2020-06-01 MED ORDER — ALLOPURINOL 300 MG PO TABS
150.0000 mg | ORAL_TABLET | Freq: Every day | ORAL | Status: DC
  Administered 2020-06-01 – 2020-06-06 (×6): 150 mg via ORAL
  Filled 2020-06-01 (×7): qty 1

## 2020-06-01 MED ORDER — METHYLPREDNISOLONE SODIUM SUCC 125 MG IJ SOLR
125.0000 mg | Freq: Once | INTRAMUSCULAR | Status: AC
  Administered 2020-06-01: 125 mg via INTRAVENOUS
  Filled 2020-06-01: qty 2

## 2020-06-01 MED ORDER — INSULIN ASPART 100 UNIT/ML ~~LOC~~ SOLN
5.0000 [IU] | Freq: Three times a day (TID) | SUBCUTANEOUS | Status: DC
  Administered 2020-06-01: 5 [IU] via SUBCUTANEOUS

## 2020-06-01 MED ORDER — SODIUM CHLORIDE 0.9 % IV SOLN: INTRAVENOUS | Status: DC

## 2020-06-01 NOTE — Progress Notes (Addendum)
Patient is consistently yelling out r/t to pain. Any for of tactile stimuli, he screams. Bilateral arms and legs are painful with edema.Both hands are red and painful to touch. Daughter states patient is experiencing a flare up of gout related to past hospitalizations. She states he exhibit the same symptoms and pain in the past. NP was called and patient was prescribed a one time dose of PO steroids. Medication was administer alone with PRN pain medication with minimal relief.

## 2020-06-01 NOTE — Evaluation (Signed)
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  Patient apparently awake but confused, complaining of right-sided neck and back pain. 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 Fredna Dow on 9/2/202.  Pt admitted for Acute metabolic encephalopathy: Present on admission.  Likely multifactorial secondary to profound dehydration, hypernatremia, AKI and opiate use   Clinical Impression   [name] is a 74 year old man with above medical history who presents supine in bed with significant amount of pain with movement and touching of arms and lower legs. Per daughter patient is having a gout flare. On evaluation patient demonstrates decreased ROM and strength of all extremities, poor activity tolerance, pain and decreased balance resulting in impaired ability for patient to perform baseline ADLs and mobility. Patient barely able to lift legs to remove pillows supporting his legs, screams in pain with touching of right upper extremity and left lower large. Patient able to grossly lift arms approximately 45 degrees with minimal ability to bend elbows and flex fingers.At this time patient is total assist for supine to sit and squat pivot to recliner with +2 assistance. Patient is currently max - total assist for ADLs. Patient will benefit from skilled OT services to improve deficits and compensatory strategies in order to improve functional abilities. At this time therapist recommends SNF at discharge. Therapist encouraged patient and family to move joints as able and  promote independence with eating and drinking if able.     Follow Up Recommendations  SNF    Equipment Recommendations  3 in 1 bedside commode;Tub/shower seat    Recommendations for Other Services       Precautions / Restrictions Precautions Precautions: Fall Precaution Comments: Significant pain behaviors with touching, tends to extend with pain Restrictions Weight Bearing Restrictions: No      Mobility Bed Mobility Overal bed mobility: Needs Assistance Bed Mobility: Supine to Sit     Supine to sit: Total assist;+2 for physical assistance;+2 for safety/equipment;HOB elevated     General bed mobility comments: Every extremity painful. Quick pivot to side of bed with total assist.  Transfers Overall transfer level: Needs assistance Equipment used: None Transfers: Squat Pivot Transfers     Squat pivot transfers: Total assist;+2 safety/equipment;+2 physical assistance     General transfer comment: Total assist to squat pivot (requiring 3 scoots) to place in chair. Patient leaning to the left and extending with pain. Hoyer pad under patient for return to bed.    Balance Overall balance assessment: Needs assistance Sitting-balance support: No upper extremity supported;Feet supported Sitting balance-Leahy Scale: Poor   Postural control: Left lateral lean                                 ADL either performed or assessed with clinical judgement   ADL Overall ADL's : Needs assistance/impaired Eating/Feeding: Moderate assistance;Sitting   Grooming: Maximal assistance;Sitting   Upper Body Bathing: Sitting;Total assistance   Lower Body Bathing: Sitting/lateral leans;Total assistance   Upper Body Dressing : Total assistance;Sitting   Lower Body Dressing: Sitting/lateral leans;Total assistance;+2 for  physical assistance   Toilet Transfer: +2 for physical assistance;Total assistance;+2 for safety/equipment;BSC;Squat-pivot;Requires drop arm   Toileting-  Clothing Manipulation and Hygiene: +2 for physical assistance;Total assistance;+2 for safety/equipment       Functional mobility during ADLs: +2 for physical assistance;+2 for safety/equipment;Total assistance       Vision Patient Visual Report: No change from baseline       Perception     Praxis      Pertinent Vitals/Pain Pain Assessment: Faces Faces Pain Scale: Hurts worst Pain Location: arms and legs, more pain in R arm and Left leg Pain Descriptors / Indicators: Grimacing;Guarding;Moaning Pain Intervention(s): Limited activity within patient's tolerance;Monitored during session;Premedicated before session     Hand Dominance Right   Extremity/Trunk Assessment Upper Extremity Assessment Upper Extremity Assessment: RUE deficits/detail;LUE deficits/detail RUE Deficits / Details: s/p recent R index finger amputation, 2+/5 shoulder, 3-/5 elbow; would not move wrist due to pain, minimal finger movement due to pain; RUE edematous LUE Deficits / Details: 3-/5 shoulder movement, 3-/5 elbow, 3-/5 wrist, minimal finger flexion due to pain.   Lower Extremity Assessment Lower Extremity Assessment: Defer to PT evaluation   Cervical / Trunk Assessment Cervical / Trunk Assessment: Kyphotic   Communication Communication Communication: No difficulties (pt with little verbalization)   Cognition Arousal/Alertness: Awake/alert Behavior During Therapy: WFL for tasks assessed/performed Overall Cognitive Status: Impaired/Different from baseline Area of Impairment: Attention;Orientation;Following commands;Safety/judgement;Awareness                 Orientation Level: Place Current Attention Level: Focused   Following Commands: Follows one step commands inconsistently Safety/Judgement: Decreased awareness of safety;Decreased awareness of deficits Awareness: Emergent       General Comments       Exercises     Shoulder Instructions      Home Living Family/patient expects  to be discharged to:: Private residence Living Arrangements: Spouse/significant other Available Help at Discharge: Family Type of Home: House Home Access: Stairs to enter Technical brewer of Steps: 5   Home Layout: One level     Bathroom Shower/Tub: Teacher, early years/pre: Standard     Home Equipment: Environmental consultant - 2 wheels          Prior Functioning/Environment Level of Independence: Needs assistance  Gait / Transfers Assistance Needed: ambulated without device ADL's / Homemaking Assistance Needed: independent            OT Problem List: Decreased strength;Decreased range of motion;Decreased activity tolerance;Impaired balance (sitting and/or standing);Decreased cognition;Decreased safety awareness;Decreased knowledge of use of DME or AE;Pain      OT Treatment/Interventions: Self-care/ADL training;Therapeutic exercise;DME and/or AE instruction;Manual therapy;Therapeutic activities;Patient/family education;Balance training    OT Goals(Current goals can be found in the care plan section) Acute Rehab OT Goals Patient Stated Goal: To go to the bathroom OT Goal Formulation: With patient/family Time For Goal Achievement: 06/15/20 Potential to Achieve Goals: Good  OT Frequency: Min 2X/week   Barriers to D/C:            Co-evaluation              AM-PAC OT "6 Clicks" Daily Activity     Outcome Measure Help from another person eating meals?: A Lot Help from another person taking care of personal grooming?: A Lot Help from another person toileting, which includes using toliet, bedpan, or urinal?: Total Help from another person bathing (including washing, rinsing, drying)?: Total Help from another person to put on and taking off regular upper body clothing?: Total Help  from another person to put on and taking off regular lower body clothing?: Total 6 Click Score: 8   End of Session Equipment Utilized During Treatment: Gait belt Nurse Communication:  Mobility status  Activity Tolerance: Patient limited by pain Patient left: in chair;with call bell/phone within reach;with family/visitor present;with chair alarm set  OT Visit Diagnosis: Other abnormalities of gait and mobility (R26.89);History of falling (Z91.81);Muscle weakness (generalized) (M62.81);Pain                Time: 1131-1203 OT Time Calculation (min): 32 min Charges:  OT General Charges $OT Visit: 1 Visit OT Evaluation $OT Eval Moderate Complexity: 1 Mod OT Treatments $Therapeutic Activity: 8-22 mins  Denese Mentink, OTR/L [place] [phone] Pager: [phone]   Lenward Chancellor 06/01/2020, 12:27 PM

## 2020-06-01 NOTE — Progress Notes (Addendum)
[facility]                                                                              Patient Demographics  [name], is a 74 y.o. male, DOB - [birth year], VOJ:[id number]  Admit date - 05/26/2020   Admitting Physician Harold Hedge, MD  Outpatient Primary MD for the patient is [name], MD  Outpatient specialists:   LOS - 6  days   Medical records reviewed and are as summarized below:    Chief Complaint  Patient presents with  . 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  Patient apparently awake but confused, complaining of right-sided neck and back pain. 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Tissue culture revealed rare gram.positive cocci, pansensitive Proteus. Patient was given Norco for pain control. Approximately 8 hours prior to admission to the ED patient was difficult to arouse. At baseline he is normally conversant and answers questions normally butwifeis concerned the pain medication (Norco) he took from his hand surgery made him delirious. ED Course: T100.87F,HR110, RR 20, BP 224/102, SPO2 93% on room air. Noted to be confused/belligerent and yelling,required chemical sedation Na149, K5.4, bicarb 12, glucose 152, BUN 74, creatinine 2.71, AG 15, AST 63, ALT 14, T bili 1.4, CK 1566, troponin 182->226, EKG normal sinus rhythm with nonspecific ST-T changes in antero-lateral leads with PVCs.  QTC 421 ms.  WBC 12.0, Hb 9.8, platelets 111, INR 1.5, UA negative, UDS positive for opiates.Trauma work-up in the ED (CT chest abdomen pelvis, thoracic/lumbar spine  x-rays) negative except for small pleural effusions bilaterally.He received cefepime, vancomycin, 1 L NS bolus,, labetalol 20 mg x 1, Geodon milligrams x1.   Assessment & Plan    Principal Problem: Acute metabolic encephalopathy, present on admission -  Likely multifactorial secondary to profound dehydration, hypernatremia, AKI and opiate use.  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  -  Appeared to be dehydration on admission, UA + ketones likely due to poor oral intake. - Urine drug screen positive for opiates which he was prescribed on last discharge.   -Tacrolimus level resulted as low normal at 2.2. -Sodium level continues to improve, 134 today -Much more alert and conversing today  Acute metabolic acidosis, POA - Secondary to renal failure vs sepsis vs mild DKA - Labs showed beta hydroxybutyrate elevation at 4.35 on presentation raising possibility of mild DKA.  Patient was placed on IV antibiotics.  Vancomycin discontinued as MRSA PCR negative. -Blood cultures negative so far, urine cultures negative.   -Leukocytosis improving, on oral Augmentin, only to cover Proteus from prior wound cultures,  -  will continue antibiotics today, DC antibiotics after today as per Dr. Fredna Dow, index finger looked good during dressing change.    Acute on chronic renal failure with hypernatremia, s/p renal transplant:  - Secondary to dehydration. Home diuretics (Lasix 80 mg twice daily) on hold.  - Seen by nephrology and started on bicarb supplemented D5W. -  Resumed chronic immunosuppressants/transplant medicines as able to take p.o. now. -Creatinine function continues to worsen 2.65, borderline BP, will hold Norvasc,BB, nitro patch    Hypertensive urgency: - Patient presented with BP 224/102 in the setting of  confusion and agitation.  - BP soft today, hold beta-blockers, Nitropatch, Norvasc   Rhabdomyolysis, elevated CK - Patient with CK level~1500.  Likely secondary to falls, being on the floor.  Patient was placed on IV fluid hydration  Elevated troponin - In the setting of renal failure and possible demand ischemia from uncontrolled blood pressure. - EKG with nonspecific ST-T changes.   -Echocardiogram shows preserved EF   Chronic diastolic CHF:  - Repeat echo shows preserved EF. 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 Fredna Dow. - Supposed to have dressing change 9/9 in clinic per daughter.  Seen by Dr. Fredna Dow and recommended "ensure coban not placed too tight. If bandaids work better for maintaining wound coverage they may be used instead. Keep wound clean and dry. Encouraged gentle motion of other digits and hand to help with swelling. Okay to follow up after discharge. Contact if any concerns".    Acute gout - pain on moving hands or wrists, knees,elevated ESR, CRP, uric acid, screams with pain, per daughter had previous similar episode with gout attack  -Placed on colchicine, allopurinol, IV Solu-Medrol 125 mg IV x1, then start prednisone 40 mg daily for 5 days  Superficial skin tears on extremities, pressure injury, left lower arm posterior skin tears, present on admission - Wound care recommendations appreciated   Code Status: Full code DVT Prophylaxis: Heparin Family Communication: Discussed all imaging results,  lab results, explained to the patient and daughter Maudie Mercury at the bedside. Disposition discussed, patient's daughter mentioned that they would like to take him home with home health when medically ready.  Explained need for rehab for PT OT documentation to patient's daughter. She feels once gout pain is improved, he will have more strength to do PT, will reassess in a.m.   Disposition Plan:     Status is: Inpatient  Remains inpatient appropriate because:Inpatient level of care appropriate due to severity of illness   Dispo: The patient is from: Home              Anticipated d/c is to: SNF              Anticipated d/c date is: 2 days              Patient currently is not medically stable to d/c.      Time Spent in minutes   *35 minutes  Procedures:  None  Consultants:     Antimicrobials:   Anti-infectives (From admission, onward)   Start  Dose/Rate Route Frequency Ordered Stop   05/29/20 0100  amoxicillin-clavulanate (AUGMENTIN) 875-125 MG per tablet 1 tablet        1 tablet Oral Every 12 hours 05/29/20 0049 06/01/20 2359   05/29/20 0015  ciprofloxacin (CIPRO) tablet 500 mg  Status:  Discontinued        500 mg Oral 2 times daily 05/29/20 0004 05/29/20 0048   05/28/20 1800  ceFEPIme (MAXIPIME) 2 g in sodium chloride 0.9 % 100 mL IVPB  Status:  Discontinued        2 g 200 mL/hr over 30 Minutes Intravenous Every 12 hours 05/28/20 0950 05/28/20 1100   05/28/20 1400  ceFAZolin (ANCEF) IVPB 2g/100 mL premix  Status:  Discontinued        2 g 200 mL/hr over 30 Minutes Intravenous Every 8 hours 05/28/20 1100 05/29/20 0004   05/28/20 0800  vancomycin (VANCOREADY) IVPB 1500 mg/300 mL  Status:  Discontinued        1,500 mg 150 mL/hr over 120 Minutes Intravenous Every 48 hours 05/26/20 0943 05/27/20 1638   05/27/20 0600  ceFEPIme (MAXIPIME) 2 g in sodium chloride 0.9 % 100 mL IVPB  Status:  Discontinued        2 g 200 mL/hr over 30 Minutes Intravenous Every 24 hours 05/26/20 0935  05/28/20 0950   05/26/20 1015  ceFEPIme (MAXIPIME) 2 g in sodium chloride 0.9 % 100 mL IVPB  Status:  Discontinued        2 g 200 mL/hr over 30 Minutes Intravenous  Once 05/26/20 1004 05/26/20 1011   05/26/20 1015  metroNIDAZOLE (FLAGYL) IVPB 500 mg  Status:  Discontinued        500 mg 100 mL/hr over 60 Minutes Intravenous Every 8 hours 05/26/20 1004 05/28/20 1100   05/26/20 1015  vancomycin (VANCOCIN) IVPB 1000 mg/200 mL premix  Status:  Discontinued        1,000 mg 200 mL/hr over 60 Minutes Intravenous  Once 05/26/20 1004 05/26/20 1012   05/26/20 0515  ceFEPIme (MAXIPIME) 1 g in sodium chloride 0.9 % 100 mL IVPB  Status:  Discontinued        1 g 200 mL/hr over 30 Minutes Intravenous  Once 05/26/20 0502 05/26/20 0506   05/26/20 0515  ceFEPIme (MAXIPIME) 2 g in sodium chloride 0.9 % 100 mL IVPB        2 g 200 mL/hr over 30 Minutes Intravenous  Once 05/26/20 0506 05/26/20 0701   05/26/20 0515  vancomycin (VANCOREADY) IVPB 1750 mg/350 mL        1,750 mg 175 mL/hr over 120 Minutes Intravenous STAT 05/26/20 0507 05/26/20 0834         Medications  Scheduled Meds: . amLODipine  10 mg Oral Daily  . amoxicillin-clavulanate  1 tablet Oral Q12H  . atorvastatin  10 mg Oral Daily  . carvedilol  6.25 mg Oral BID WC  . Chlorhexidine Gluconate Cloth  6 each Topical Q0600  . heparin  5,000 Units Subcutaneous Q8H  . insulin aspart  0-15 Units Subcutaneous TID WC  . insulin aspart  0-5 Units Subcutaneous QHS  . insulin aspart  3 Units Subcutaneous TID WC  . insulin glargine  15 Units Subcutaneous QHS  . mouth rinse  15 mL Mouth Rinse BID  . mycophenolate  500 mg Oral BID  . nitroGLYCERIN  0.1 mg Transdermal Daily  . predniSONE  40 mg Oral QAC breakfast  . sodium chloride flush  10-40 mL Intracatheter Q12H  .  sodium chloride flush  3 mL Intravenous Q12H  . tacrolimus  1 mg Oral BID   Continuous Infusions: . sodium chloride 250 mL (05/31/20 0754)   PRN Meds:.sodium chloride, albuterol,  fentaNYL (SUBLIMAZE) injection, hydrALAZINE, hydrALAZINE, metoprolol tartrate, oxyCODONE-acetaminophen, sodium chloride flush      Subjective:   Welford Christmas was seen and examined today.  Much more alert however still has some confusion.  Per daughter, patient has memory issues.  Pain when lifting hands or arms.  No acute chest pain, shortness of breath, abdominal pain, nausea vomiting or diarrhea.    Objective:   Vitals:   06/01/20 0600 06/01/20 0625 06/01/20 0938 06/01/20 1254  BP:  (!) 108/56 110/61 (!) 100/56  Pulse:  70 75 (!) 54  Resp:  17  16  Temp:  98.8 F (37.1 C)  98.1 F (36.7 C)  TempSrc:    Oral  SpO2:    98%  Weight: 91.6 kg     Height:        Intake/Output Summary (Last 24 hours) at 06/01/2020 1345 Last data filed at 06/01/2020 0900 Gross per 24 hour  Intake 480 ml  Output 1000 ml  Net -520 ml     Wt Readings from Last 3 Encounters:  06/01/20 91.6 kg  05/22/20 88.9 kg  04/09/20 90.3 kg   Physical Exam  General: Alert and oriented x2 uncomfortable on moving  Cardiovascular: S1 S2 clear, RRR. No pedal edema b/l  Respiratory: CTAB, no wheezing, rales or rhonchi  Gastrointestinal: Soft, nontender, nondistended, NBS  Ext: no pedal edema bilaterally  Neuro: no new deficits  Musculoskeletal: No cyanosis, clubbing  Skin: Dressing in place on the right index ankle, heel protectors  Psych: still somewhat confused    Data Reviewed:  I have personally reviewed following labs and imaging studies  Micro Results Recent Results (from the past 240 hour(s))  Blood culture (routine x 2)     Status: None   Collection Time: 05/26/20  4:42 AM   Specimen: BLOOD  Result Value Ref Range Status   Specimen Description   Final    BLOOD RIGHT HAND Performed at [facility] [address] [place], [place] [address]    Special Requests   Final    BOTTLES DRAWN AEROBIC AND ANAEROBIC Blood Culture results may not be optimal due to an  inadequate volume of blood received in culture bottles Performed at [facility] [address], [place], [place] [address]    Culture   Final    NO GROWTH 5 DAYS Performed at [facility], [place] [address], [place], [place] [address]    Report Status 05/31/2020 FINAL  Final  Urine Culture     Status: Abnormal   Collection Time: 05/26/20  4:42 AM   Specimen: Urine, Random  Result Value Ref Range Status   Specimen Description   Final    URINE, RANDOM Performed at [facility] [address], [place], [place] [address]    Special Requests   Final    NONE Performed at [facility], [place] [address], [place], [place] [address]    Culture (A)  Final    <10,000 COLONIES/mL INSIGNIFICANT GROWTH Performed at [facility] [address], [place], [place] [address]    Report Status 05/27/2020 FINAL  Final  SARS Coronavirus 2 by RT PCR (hospital order, performed in [facility] hospital lab) Nasopharyngeal Nasopharyngeal Swab     Status: None  Collection Time: 05/26/20  4:43 AM   Specimen: Nasopharyngeal Swab  Result Value Ref Range Status   SARS Coronavirus 2 NEGATIVE NEGATIVE Final    Comment: (NOTE) SARS-CoV-2 target nucleic acids are NOT DETECTED.  The SARS-CoV-2 RNA is generally detectable in upper and lower respiratory specimens during the acute phase of infection. The lowest concentration of SARS-CoV-2 viral copies this assay can detect is 250 copies / mL. A negative result does not preclude SARS-CoV-2 infection and should not be used as the sole basis for treatment or other patient management decisions.  A negative result may occur with improper specimen collection / handling, submission of specimen other than nasopharyngeal swab, presence of viral mutation(s) within the areas targeted by this assay, and inadequate number of viral copies (<250 copies / mL). A negative result must be combined with  clinical observations, patient history, and epidemiological information.  Fact Sheet for Patients:   StrictlyIdeas.no  Fact Sheet for Healthcare Providers: BankingDealers.co.za  This test is not yet approved or  cleared by the [place] FDA and has been authorized for detection and/or diagnosis of SARS-CoV-2 by FDA under an Emergency Use Authorization (EUA).  This EUA will remain in effect (meaning this test can be used) for the duration of the COVID-19 declaration under Section 564(b)(1) of the Act, 21 U.S.C. section 360bbb-3(b)(1), unless the authorization is terminated or revoked sooner.  Performed at [facility], [place] [address] [place], [place] [address]   MRSA PCR Screening     Status: None   Collection Time: 05/27/20 10:05 AM   Specimen: Nasopharyngeal  Result Value Ref Range Status   MRSA by PCR NEGATIVE NEGATIVE Final    Comment:        The GeneXpert MRSA Assay (FDA approved for NASAL specimens only), is one component of a comprehensive MRSA colonization surveillance program. It is not intended to diagnose MRSA infection nor to guide or monitor treatment for MRSA infections. Performed at [facility], [place] [address] [place], [place] [address]     Radiology Reports CT ABDOMEN PELVIS WO CONTRAST  Result Date: 05/26/2020 CLINICAL DATA:  Fall, found down EXAM: CT CHEST, ABDOMEN AND PELVIS WITHOUT CONTRAST TECHNIQUE: Multidetector CT imaging of the chest, abdomen and pelvis was performed following the standard protocol without IV contrast. COMPARISON:  None. FINDINGS: Motion degraded images. CT CHEST FINDINGS Cardiovascular: Cardiomegaly. No pericardial effusion. Chronic pericardial calcifications. No evidence of thoracic aortic aneurysm. Atherosclerotic calcifications of the aortic arch. Enlargement of the main pulmonary artery, suggesting pulmonary arterial hypertension. Three  vessel coronary atherosclerosis. Mediastinum/Nodes: No suspicious mediastinal lymphadenopathy. Visualized thyroid is unremarkable. Lungs/Pleura: Evaluation of the lung parenchyma is constrained by respiratory motion. Within that constraint, there are no suspicious pulmonary nodules. Small right and trace left pleural effusions. Mild right basilar atelectasis.  No focal consolidation. No pneumothorax. Musculoskeletal: No acute fracture is seen. Specifically, the sternum, clavicles, and scapulae are intact. Ribs are poorly evaluated due to motion degradation. Old left rib fracture deformities without definite acute fracture. Dedicated thoracic spine evaluation will be reported separately. CT ABDOMEN PELVIS FINDINGS Hepatobiliary: Unenhanced liver is grossly unremarkable. No perihepatic fluid/hemorrhage. Suspected layering gallbladder sludge (series 2/image 25), without associated inflammatory changes. No intrahepatic or extrahepatic ductal dilatation. Pancreas: Within normal limits. Spleen: Within normal limits.  No perisplenic fluid/hemorrhage. Adrenals/Urinary Tract: Adrenal glands are within normal limits. Severe bilateral renal cortical atrophy with a dominant 3.6 cm right upper pole renal cyst. No hydronephrosis. Calcified, amorphous right lower quadrant renal transplant  with sequela of rejection (series 2/image 54). Left lower quadrant renal transplant without hydronephrosis. Bladder is within normal limits. Stomach/Bowel: Stomach is within normal limits. No evidence of bowel obstruction. Normal appendix (series 2/image 22). Extensive left colonic diverticulosis, without evidence of diverticulitis. Vascular/Lymphatic: No evidence of abdominal aortic aneurysm. Extensive atherosclerotic calcifications the abdominal aorta and branch vessels. No suspicious abdominopelvic lymphadenopathy. Reproductive: Prostate is grossly unremarkable. Other: No abdominopelvic ascites. No hemoperitoneum or free air. Musculoskeletal:  No fracture is seen. Specifically, the visualized bony pelvis and proximal femurs are intact. Dedicated lumbar spine evaluation will be reported separately. IMPRESSION: Motion degraded images. No evidence of traumatic injury to the chest, abdomen, or pelvis. Dedicated thoracolumbar spine evaluation will be reported separately. Small right and trace left pleural effusions. Additional chronic ancillary findings as above. Electronically Signed   By: Julian Hy M.D.   On: 05/26/2020 06:41   DG Chest 1 View  Result Date: 05/26/2020 CLINICAL DATA:  Altered mental status EXAM: CHEST  1 VIEW COMPARISON:  09/06/2018 FINDINGS: Moderate cardiomegaly. Hazy opacities in the right lung may be exaggerated by rotation. Right pleural effusion. IMPRESSION: Right pleural effusion and moderate cardiomegaly. Electronically Signed   By: Ulyses Jarred M.D.   On: 05/26/2020 06:48   DG Pelvis 1-2 Views  Result Date: 05/26/2020 CLINICAL DATA:  Fall found down EXAM: PELVIS - 1-2 VIEW COMPARISON:  None. FINDINGS: There is no evidence of pelvic fracture or diastasis. No pelvic bone lesions are seen. IMPRESSION: Negative. Electronically Signed   By: Ulyses Jarred M.D.   On: 05/26/2020 06:49   DG Forearm Right  Result Date: 05/26/2020 CLINICAL DATA:  Right forearm injury after fall. EXAM: RIGHT FOREARM - 2 VIEW COMPARISON:  None. FINDINGS: There is no evidence of fracture or other focal bone lesions. Vascular calcifications are noted. Postsurgical changes are noted in the antecubital region. IMPRESSION: Negative. Electronically Signed   By: Marijo Conception M.D.   On: 05/26/2020 09:15   CT Head Wo Contrast  Result Date: 05/26/2020 CLINICAL DATA:  Fall EXAM: CT HEAD WITHOUT CONTRAST CT CERVICAL SPINE WITHOUT CONTRAST TECHNIQUE: Multidetector CT imaging of the head and cervical spine was performed following the standard protocol without intravenous contrast. Multiplanar CT image reconstructions of the cervical spine were also  generated. COMPARISON:  None. FINDINGS: CT HEAD FINDINGS Brain: There is no mass, hemorrhage or extra-axial collection. The size and configuration of the ventricles and extra-axial CSF spaces are normal. There is hypoattenuation of the periventricular white matter, most commonly indicating chronic ischemic microangiopathy. Vascular: Atherosclerotic calcification of the vertebral and internal carotid arteries at the skull base. No abnormal hyperdensity of the major intracranial arteries or dural venous sinuses. Skull: Small hematoma at the scalp vertex.  No skull fracture. Sinuses/Orbits: No fluid levels or advanced mucosal thickening of the visualized paranasal sinuses. No mastoid or middle ear effusion. The orbits are normal. CT CERVICAL SPINE FINDINGS Alignment: No static subluxation. Facets are aligned. Occipital condyles are normally positioned. Skull base and vertebrae: No acute fracture. Soft tissues and spinal canal: No prevertebral fluid or swelling. No visible canal hematoma. Disc levels: C5-7 degenerative disc disease. Upper chest: No pneumothorax, pulmonary nodule or pleural effusion. Other: Normal visualized paraspinal cervical soft tissues. IMPRESSION: 1. 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 2. Small scalp vertex hematoma without skull fracture. 3. No acute fracture or static subluxation of the cervical spine. Electronically Signed   By: Ulyses Jarred M.D.   On: 05/26/2020 06:44   CT Chest Wo  Contrast  Result Date: 05/26/2020 CLINICAL DATA:  Fall, found down EXAM: CT CHEST, ABDOMEN AND PELVIS WITHOUT CONTRAST TECHNIQUE: Multidetector CT imaging of the chest, abdomen and pelvis was performed following the standard protocol without IV contrast. COMPARISON:  None. FINDINGS: Motion degraded images. CT CHEST FINDINGS Cardiovascular: Cardiomegaly. No pericardial effusion. Chronic pericardial calcifications. No evidence of thoracic aortic aneurysm. Atherosclerotic  calcifications of the aortic arch. Enlargement of the main pulmonary artery, suggesting pulmonary arterial hypertension. Three vessel coronary atherosclerosis. Mediastinum/Nodes: No suspicious mediastinal lymphadenopathy. Visualized thyroid is unremarkable. Lungs/Pleura: Evaluation of the lung parenchyma is constrained by respiratory motion. Within that constraint, there are no suspicious pulmonary nodules. Small right and trace left pleural effusions. Mild right basilar atelectasis.  No focal consolidation. No pneumothorax. Musculoskeletal: No acute fracture is seen. Specifically, the sternum, clavicles, and scapulae are intact. Ribs are poorly evaluated due to motion degradation. Old left rib fracture deformities without definite acute fracture. Dedicated thoracic spine evaluation will be reported separately. CT ABDOMEN PELVIS FINDINGS Hepatobiliary: Unenhanced liver is grossly unremarkable. No perihepatic fluid/hemorrhage. Suspected layering gallbladder sludge (series 2/image 25), without associated inflammatory changes. No intrahepatic or extrahepatic ductal dilatation. Pancreas: Within normal limits. Spleen: Within normal limits.  No perisplenic fluid/hemorrhage. Adrenals/Urinary Tract: Adrenal glands are within normal limits. Severe bilateral renal cortical atrophy with a dominant 3.6 cm right upper pole renal cyst. No hydronephrosis. Calcified, amorphous right lower quadrant renal transplant with sequela of rejection (series 2/image 54). Left lower quadrant renal transplant without hydronephrosis. Bladder is within normal limits. Stomach/Bowel: Stomach is within normal limits. No evidence of bowel obstruction. Normal appendix (series 2/image 22). Extensive left colonic diverticulosis, without evidence of diverticulitis. Vascular/Lymphatic: No evidence of abdominal aortic aneurysm. Extensive atherosclerotic calcifications the abdominal aorta and branch vessels. No suspicious abdominopelvic lymphadenopathy.  Reproductive: Prostate is grossly unremarkable. Other: No abdominopelvic ascites. No hemoperitoneum or free air. Musculoskeletal: No fracture is seen. Specifically, the visualized bony pelvis and proximal femurs are intact. Dedicated lumbar spine evaluation will be reported separately. IMPRESSION: Motion degraded images. No evidence of traumatic injury to the chest, abdomen, or pelvis. Dedicated thoracolumbar spine evaluation will be reported separately. Small right and trace left pleural effusions. Additional chronic ancillary findings as above. Electronically Signed   By: Julian Hy M.D.   On: 05/26/2020 06:41   CT Cervical Spine Wo Contrast  Result Date: 05/26/2020 CLINICAL DATA:  Fall EXAM: CT HEAD WITHOUT CONTRAST CT CERVICAL SPINE WITHOUT CONTRAST TECHNIQUE: Multidetector CT imaging of the head and cervical spine was performed following the standard protocol without intravenous contrast. Multiplanar CT image reconstructions of the cervical spine were also generated. COMPARISON:  None. FINDINGS: CT HEAD FINDINGS Brain: There is no mass, hemorrhage or extra-axial collection. The size and configuration of the ventricles and extra-axial CSF spaces are normal. There is hypoattenuation of the periventricular white matter, most commonly indicating chronic ischemic microangiopathy. Vascular: Atherosclerotic calcification of the vertebral and internal carotid arteries at the skull base. No abnormal hyperdensity of the major intracranial arteries or dural venous sinuses. Skull: Small hematoma at the scalp vertex.  No skull fracture. Sinuses/Orbits: No fluid levels or advanced mucosal thickening of the visualized paranasal sinuses. No mastoid or middle ear effusion. The orbits are normal. CT CERVICAL SPINE FINDINGS Alignment: No static subluxation. Facets are aligned. Occipital condyles are normally positioned. Skull base and vertebrae: No acute fracture. Soft tissues and spinal canal: No prevertebral fluid or  swelling. No visible canal hematoma. Disc levels: C5-7 degenerative disc disease. Upper chest: No pneumothorax, pulmonary  nodule or pleural effusion. Other: Normal visualized paraspinal cervical soft tissues. IMPRESSION: 1. 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 2. Small scalp vertex hematoma without skull fracture. 3. No acute fracture or static subluxation of the cervical spine. Electronically Signed   By: Deatra Robinson M.D.   On: 05/26/2020 06:44   MR BRAIN WO CONTRAST  Result Date: 05/28/2020 CLINICAL DATA:  Delirium EXAM: MRI HEAD WITHOUT CONTRAST TECHNIQUE: Multiplanar, multiecho pulse sequences of the brain and surrounding structures were obtained without intravenous contrast. COMPARISON:  None. FINDINGS: Motion artifact is present. Brain: There is no acute infarction or intracranial hemorrhage. There is no intracranial mass, mass effect, or edema. There is no hydrocephalus or extra-axial fluid collection. Small chronic left occipital infarct. Minimal patchy T2 hyperintensity in the supratentorial white matter is nonspecific but may reflect minor chronic microvascular ischemic changes. Prominence of the ventricles and sulci reflects generalized parenchymal volume loss. Vascular: Major vessel flow voids at the skull base are preserved. Skull and upper cervical spine: Normal marrow signal is preserved. Sinuses/Orbits: Paranasal sinuses are aerated. Orbits are unremarkable. Other: Sella is unremarkable.  Mastoid air cells are clear. IMPRESSION: Motion degraded study. No evidence of recent infarction, hemorrhage, or mass. Small chronic left occipital infarct. Electronically Signed   By: Guadlupe Spanish M.D.   On: 05/28/2020 16:27   CT T-SPINE NO CHARGE  Result Date: 05/26/2020 CLINICAL DATA:  Fall EXAM: CT Thoracic and Lumbar spine without contrast TECHNIQUE: Multiplanar CT images of the thoracic and lumbar spine were reconstructed from contemporary CT of the Chest, Abdomen,  and Pelvis CONTRAST:  No additional contrast COMPARISON:  None FINDINGS: CT THORACIC SPINE FINDINGS Alignment: Normal. Vertebrae: No acute fracture or focal pathologic process. Disc levels: No spinal canal stenosis CT LUMBAR SPINE FINDINGS Segmentation: 5 lumbar type vertebrae. Alignment: Normal. Vertebrae: No acute fracture or focal pathologic process. Paraspinal and other soft tissues: Negative. Disc levels: No spinal canal stenosis. IMPRESSION: No acute fracture or static subluxation of the thoracic or lumbar spine. Electronically Signed   By: Deatra Robinson M.D.   On: 05/26/2020 06:35   CT L-SPINE NO CHARGE  Result Date: 05/26/2020 CLINICAL DATA:  Fall EXAM: CT Thoracic and Lumbar spine without contrast TECHNIQUE: Multiplanar CT images of the thoracic and lumbar spine were reconstructed from contemporary CT of the Chest, Abdomen, and Pelvis CONTRAST:  No additional contrast COMPARISON:  None FINDINGS: CT THORACIC SPINE FINDINGS Alignment: Normal. Vertebrae: No acute fracture or focal pathologic process. Disc levels: No spinal canal stenosis CT LUMBAR SPINE FINDINGS Segmentation: 5 lumbar type vertebrae. Alignment: Normal. Vertebrae: No acute fracture or focal pathologic process. Paraspinal and other soft tissues: Negative. Disc levels: No spinal canal stenosis. IMPRESSION: No acute fracture or static subluxation of the thoracic or lumbar spine. Electronically Signed   By: Deatra Robinson M.D.   On: 05/26/2020 06:35   DG CHEST PORT 1 VIEW  Result Date: 05/29/2020 CLINICAL DATA:  Central line placement EXAM: PORTABLE CHEST 1 VIEW.  Patient is slightly rotated. COMPARISON:  Chest x-ray 05/26/2020, CT chest 05/26/2020. FINDINGS: Interval placement of a left internal jugular central venous catheter with tip overlying the expected region of the superior cavoatrial junction. Moderate cardiomegaly the may be exaggerated due to portable AP technique. The heart size and mediastinal contours are unchanged. Aortic arch  calcification. Hilar vasculature prominence. Improved aeration of bilateral lungs. No focal consolidation. No pulmonary edema. Persistent bilateral trace pleural effusions. No pneumothorax. No acute osseous abnormality.  Old healed left rib fractures. IMPRESSION:  New left internal jugular central venous catheter with tip overlying the expected region of the superior cavoatrial junction. Persistent bilateral trace pleural effusions in a patient with moderate cardiomegaly. Electronically Signed   By: Tish Frederickson M.D.   On: 05/29/2020 13:01   DG Hand Complete Right  Result Date: 05/26/2020 CLINICAL DATA:  Right hand pain after fall. EXAM: RIGHT HAND - COMPLETE 3+ VIEW COMPARISON:  None. 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 Vascular calcifications are noted. No other bony abnormality is noted. Joint spaces are intact. IMPRESSION: Status post amputation of a portion of second middle phalanx as well as the second distal phalanx. Electronically Signed   By: Lupita Raider M.D.   On: 05/26/2020 09:13   ECHOCARDIOGRAM COMPLETE  Result Date: 05/26/2020    ECHOCARDIOGRAM REPORT   Patient Name:   ROBEL WUERTZ Date of Exam: 05/26/2020 Medical Rec #:  [id number]       Height:       69.0 in Accession #:    [id number]      Weight:       196.0 lb Date of Birth:  1946/08/13      BSA:          2.048 m Patient Age:    73 years        BP:           161/69 mmHg Patient Gender: M               HR:           74 bpm. Exam Location:  Inpatient Procedure: 2D Echo, Cardiac Doppler and Color Doppler Indications:    CHF-Acute Diastolic 428.31 / I50.31  History:        Patient has prior history of Echocardiogram examinations, most                 recent 03/01/2019. CHF, Arrythmias:Atrial Fibrillation; Risk                 Factors:Dyslipidemia, Hypertension, Diabetes and Non-Smoker.  Sonographer:    Renella Cunas RDCS Referring Phys: [id number] JARED E SEGAL  IMPRESSIONS  1. Left ventricular ejection fraction, by estimation, is 55 to 60%. The left ventricle has normal function. The left ventricle has no regional wall motion abnormalities. Left ventricular diastolic parameters are indeterminate. Elevated left ventricular end-diastolic pressure.  2. Right ventricular systolic function is normal. The right ventricular size is normal. Tricuspid regurgitation signal is inadequate for assessing PA pressure.  3. The mitral valve is normal in structure. No evidence of mitral valve regurgitation. No evidence of mitral stenosis.  4. The aortic valve is tricuspid. Aortic valve regurgitation is trivial. Mild aortic valve sclerosis is present, with no evidence of aortic valve stenosis.  5. There is mild dilatation of the aortic root measuring 42 mm.  6. The inferior vena cava is dilated in size with <50% respiratory variability, suggesting right atrial pressure of 15 mmHg. FINDINGS  Left Ventricle: Left ventricular ejection fraction, by estimation, is 55 to 60%. The left ventricle has normal function. The left ventricle has no regional wall motion abnormalities. The left ventricular internal cavity size was normal in size. There is  no left ventricular hypertrophy. Left ventricular diastolic parameters are indeterminate. Elevated left ventricular end-diastolic pressure. Right Ventricle: The right ventricular size is normal. No increase in right ventricular wall thickness. Right ventricular systolic function is normal. Tricuspid regurgitation signal is  inadequate for assessing PA pressure. Left Atrium: Left atrial size was normal in size. Right Atrium: Right atrial size was normal in size. Pericardium: There is no evidence of pericardial effusion. Mitral Valve: The mitral valve is normal in structure. Normal mobility of the mitral valve leaflets. Mild mitral annular calcification. No evidence of mitral valve regurgitation. No evidence of mitral valve stenosis. Tricuspid Valve: The  tricuspid valve is normal in structure. Tricuspid valve regurgitation is trivial. No evidence of tricuspid stenosis. Aortic Valve: The aortic valve is tricuspid. Aortic valve regurgitation is trivial. Mild aortic valve sclerosis is present, with no evidence of aortic valve stenosis. Pulmonic Valve: The pulmonic valve was normal in structure. Pulmonic valve regurgitation is not visualized. No evidence of pulmonic stenosis. Aorta: The aortic root is normal in size and structure. There is mild dilatation of the aortic root measuring 42 mm. Venous: The inferior vena cava is dilated in size with less than 50% respiratory variability, suggesting right atrial pressure of 15 mmHg. IAS/Shunts: No atrial level shunt detected by color flow Doppler.  LEFT VENTRICLE PLAX 2D LVIDd:         4.70 cm      Diastology LVIDs:         3.30 cm      LV e' lateral:   11.10 cm/s LV PW:         1.00 cm      LV E/e' lateral: 12.1 LV IVS:        1.00 cm      LV e' medial:    7.73 cm/s LVOT diam:     2.80 cm      LV E/e' medial:  17.3 LV SV:         100 LV SV Index:   49 LVOT Area:     6.16 cm  LV Volumes (MOD) LV vol d, MOD A2C: 103.0 ml LV vol d, MOD A4C: 119.0 ml LV vol s, MOD A2C: 43.4 ml LV vol s, MOD A4C: 56.6 ml LV SV MOD A2C:     59.6 ml LV SV MOD A4C:     119.0 ml LV SV MOD BP:      61.0 ml RIGHT VENTRICLE RV S prime:     10.60 cm/s TAPSE (M-mode): 1.2 cm LEFT ATRIUM             Index       RIGHT ATRIUM           Index LA diam:        3.60 cm 1.76 cm/m  RA Area:     17.90 cm LA Vol (A2C):   69.0 ml 33.69 ml/m RA Volume:   53.10 ml  25.92 ml/m LA Vol (A4C):   54.5 ml 26.61 ml/m LA Biplane Vol: 67.1 ml 32.76 ml/m  AORTIC VALVE LVOT Vmax:   87.20 cm/s LVOT Vmean:  57.700 cm/s LVOT VTI:    0.162 m  AORTA Ao Root diam: 4.20 cm MITRAL VALVE MV Area (PHT): 3.48 cm     SHUNTS MV Decel Time: 218 msec     Systemic VTI:  0.16 m MV E velocity: 134.00 cm/s  Systemic Diam: 2.80 cm MV A velocity: 41.60 cm/s MV E/A ratio:  3.22 Fransico Him MD  Electronically signed by Fransico Him MD Signature Date/Time: 05/26/2020/12:12:24 PM    Final     Lab Data:  CBC: Recent Labs  Lab 05/28/20 0302 05/29/20 0249 05/30/20 1110 05/31/20 0653 06/01/20 0939  WBC 12.5* 11.8*  11.6* 14.6* 13.0*  HGB 9.5* 9.3* 9.1* 8.5* 7.4*  HCT 31.9* 31.1* 30.1* 28.9* 24.7*  MCV 95.8 96.6 95.9 96.7 95.4  PLT 122* 120* 107* 96* 81*   Basic Metabolic Panel: Recent Labs  Lab 05/26/20 0844 05/26/20 1722 05/28/20 0302 05/28/20 0302 05/29/20 0249 05/29/20 2048 05/30/20 1110 05/31/20 0653 06/01/20 0939  NA  --    < > 152*   < > 149* 147* 145 141 134*  K  --    < > 4.6   < > 3.6 4.3 3.9 4.1 4.5  CL  --    < > 119*   < > 120* 116* 111 106 103  CO2  --    < > 19*   < > 22 24 24 23  20*  GLUCOSE  --    < > 313*   < > 172* 235* 151* 205* 221*  BUN  --    < > 53*   < > 54* 49* 46* 45* 59*  CREATININE  --    < > 2.03*   < > 2.00*  2.06* 1.95* 1.86* 2.08* 2.65*  CALCIUM  --    < > 9.2   < > 9.0 8.9 8.6* 8.4* 8.2*  MG 2.2  --   --   --   --  1.8  --   --   --   PHOS  --   --  2.7  --  1.8*  --  2.2*  --   --    < > = values in this interval not displayed.   GFR: Estimated Creatinine Clearance: 28.2 mL/min (A) (by C-G formula based on SCr of 2.65 mg/dL (H)). Liver Function Tests: Recent Labs  Lab 05/26/20 0441 05/27/20 0410 05/28/20 0302 05/29/20 0249 05/30/20 1110  AST 63* 57*  --   --   --   ALT 14 16  --   --   --   ALKPHOS 71 59  --   --   --   BILITOT 1.4* 1.2  --   --   --   PROT 7.0 5.9*  --   --   --   ALBUMIN 3.9 3.3* 3.2* 3.0* 2.9*   Recent Labs  Lab 05/26/20 0441  LIPASE 26   Recent Labs  Lab 05/26/20 0443  AMMONIA 27   Coagulation Profile: Recent Labs  Lab 05/26/20 0441  INR 1.5*   Cardiac Enzymes: Recent Labs  Lab 05/26/20 0441  CKTOTAL 1,566*   BNP (last 3 results) No results for input(s): PROBNP in the last 8760 hours. HbA1C: No results for input(s): HGBA1C in the last 72 hours. CBG: Recent Labs  Lab  05/31/20 1130 05/31/20 1556 05/31/20 2058 06/01/20 0816 06/01/20 1134  GLUCAP 244* 257* 173* 140* 208*   Lipid Profile: No results for input(s): CHOL, HDL, LDLCALC, TRIG, CHOLHDL, LDLDIRECT in the last 72 hours. Thyroid Function Tests: No results for input(s): TSH, T4TOTAL, FREET4, T3FREE, THYROIDAB in the last 72 hours. Anemia Panel: No results for input(s): VITAMINB12, FOLATE, FERRITIN, TIBC, IRON, RETICCTPCT in the last 72 hours. Urine analysis:    Component Value Date/Time   COLORURINE YELLOW 05/26/2020 0441   APPEARANCEUR CLEAR 05/26/2020 0441   LABSPEC 1.020 05/26/2020 0441   PHURINE 5.5 05/26/2020 0441   GLUCOSEU NEGATIVE 05/26/2020 0441   HGBUR LARGE (A) 05/26/2020 0441   BILIRUBINUR NEGATIVE 05/26/2020 0441   KETONESUR 40 (A) 05/26/2020 0441   PROTEINUR 100 (A) 05/26/2020 0441   NITRITE NEGATIVE 05/26/2020  [place] 05/26/2020 0441     Antwan Bribiesca M.D. [facility] 06/01/2020, 1:45 PM   Call night coverage person covering after 7pm

## 2020-06-02 LAB — BASIC METABOLIC PANEL
Anion gap: 11 (ref 5–15)
BUN: 78 mg/dL — ABNORMAL HIGH (ref 8–23)
CO2: 20 mmol/L — ABNORMAL LOW (ref 22–32)
Calcium: 8.3 mg/dL — ABNORMAL LOW (ref 8.9–10.3)
Chloride: 102 mmol/L (ref 98–111)
Creatinine, Ser: 2.66 mg/dL — ABNORMAL HIGH (ref 0.61–1.24)
GFR calc Af Amer: 27 mL/min — ABNORMAL LOW (ref >60)
GFR calc non Af Amer: 23 mL/min — ABNORMAL LOW (ref >60)
Glucose, Bld: 286 mg/dL — ABNORMAL HIGH (ref 70–99)
Potassium: 4.9 mmol/L (ref 3.5–5.1)
Sodium: 133 mmol/L — ABNORMAL LOW (ref 135–145)

## 2020-06-02 LAB — CBC
HCT: 24.1 % — ABNORMAL LOW (ref 39.0–52.0)
Hemoglobin: 7.3 g/dL — ABNORMAL LOW (ref 13.0–17.0)
MCH: 28.5 pg (ref 26.0–34.0)
MCHC: 30.3 g/dL (ref 30.0–36.0)
MCV: 94.1 fL (ref 80.0–100.0)
Platelets: 87 10*3/uL — ABNORMAL LOW (ref 150–400)
RBC: 2.56 MIL/uL — ABNORMAL LOW (ref 4.22–5.81)
RDW: 13.5 % (ref 11.5–15.5)
WBC: 9.8 10*3/uL (ref 4.0–10.5)
nRBC: 0 % (ref 0.0–0.2)

## 2020-06-02 LAB — GLUCOSE, CAPILLARY
Glucose-Capillary: 273 mg/dL — ABNORMAL HIGH (ref 70–99)
Glucose-Capillary: 354 mg/dL — ABNORMAL HIGH (ref 70–99)
Glucose-Capillary: 242 mg/dL — ABNORMAL HIGH (ref 70–99)
Glucose-Capillary: 187 mg/dL — ABNORMAL HIGH (ref 70–99)

## 2020-06-02 LAB — HEMOGLOBIN A1C
Hgb A1c MFr Bld: 6.9 % — ABNORMAL HIGH (ref 4.8–5.6)
Mean Plasma Glucose: 151.33 mg/dL

## 2020-06-02 MED ORDER — INSULIN ASPART 100 UNIT/ML ~~LOC~~ SOLN
6.0000 [IU] | Freq: Three times a day (TID) | SUBCUTANEOUS | Status: DC
  Administered 2020-06-02 – 2020-06-06 (×11): 6 [IU] via SUBCUTANEOUS

## 2020-06-02 MED ORDER — INSULIN ASPART 100 UNIT/ML ~~LOC~~ SOLN
0.0000 [IU] | Freq: Three times a day (TID) | SUBCUTANEOUS | Status: DC
  Administered 2020-06-02: 7 [IU] via SUBCUTANEOUS
  Administered 2020-06-03: 4 [IU] via SUBCUTANEOUS
  Administered 2020-06-03: 3 [IU] via SUBCUTANEOUS
  Administered 2020-06-03 – 2020-06-04 (×2): 7 [IU] via SUBCUTANEOUS
  Administered 2020-06-05: 3 [IU] via SUBCUTANEOUS
  Administered 2020-06-05: 4 [IU] via SUBCUTANEOUS
  Administered 2020-06-05: 3 [IU] via SUBCUTANEOUS
  Administered 2020-06-06: 7 [IU] via SUBCUTANEOUS
  Administered 2020-06-06: 11 [IU] via SUBCUTANEOUS

## 2020-06-02 MED ORDER — GABAPENTIN 100 MG PO CAPS
100.0000 mg | ORAL_CAPSULE | Freq: Every day | ORAL | Status: AC
  Administered 2020-06-02: 100 mg via ORAL
  Filled 2020-06-02: qty 1

## 2020-06-02 MED ORDER — INSULIN GLARGINE 100 UNIT/ML ~~LOC~~ SOLN
20.0000 [IU] | Freq: Every day | SUBCUTANEOUS | Status: DC
  Administered 2020-06-02 – 2020-06-05 (×4): 20 [IU] via SUBCUTANEOUS
  Filled 2020-06-02 (×5): qty 0.2

## 2020-06-02 NOTE — Care Management Important Message (Signed)
Important Message  Patient Details IM Letter given to the Patient Name: [name] MRN: [id number] Date of Birth: 1945-11-13   Medicare Important Message Given:  Yes     Kerin Salen 06/02/2020, 12:03 PM

## 2020-06-02 NOTE — Progress Notes (Signed)
[facility]                                                                              Patient Demographics  [name], is a 74 y.o. male, DOB - [birth year], YBW:[id number]  Admit date - 05/26/2020   Admitting Physician Harold Hedge, MD  Outpatient Primary MD for the patient is [name], MD  Outpatient specialists:   LOS - 7  days   Medical records reviewed and are as summarized below:    Chief Complaint  Patient presents with  . 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  Patient apparently awake but confused, complaining of right-sided neck and back pain. 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Tissue culture revealed rare gram.positive cocci, pansensitive Proteus. Patient was given Norco for pain control. Approximately 8 hours prior to admission to the ED patient was difficult to arouse. At baseline he is normally conversant and answers questions normally butwifeis concerned the pain medication (Norco) he took from his hand surgery made him delirious. ED Course: T100.74F,HR110, RR 20, BP 224/102, SPO2 93% on room air. Noted to be confused/belligerent and yelling,required chemical sedation Na149, K5.4, bicarb 12, glucose 152, BUN 74, creatinine 2.71, AG 15, AST 63, ALT 14, T bili 1.4, CK 1566, troponin 182->226, EKG normal sinus rhythm with nonspecific ST-T changes in antero-lateral leads with PVCs.  QTC 421 ms.  WBC 12.0, Hb 9.8, platelets 111, INR 1.5, UA negative, UDS positive for opiates.Trauma work-up in the ED (CT chest abdomen pelvis, thoracic/lumbar spine  x-rays) negative except for small pleural effusions bilaterally.He received cefepime, vancomycin, 1 L NS bolus,, labetalol 20 mg x 1, Geodon milligrams x1.   Assessment & Plan    Principal Problem: Acute metabolic encephalopathy, present on admission -  Likely multifactorial secondary to profound dehydration, hypernatremia, AKI and opiate use.  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  -  Appeared to be dehydration on admission, UA + ketones likely due to poor oral intake. - Urine drug screen positive for opiates which he was prescribed on last discharge.   -Tacrolimus level resulted as low normal at 2.2. -Sodium level improved to 133, alert and oriented, appears back to his baseline  Acute metabolic acidosis, POA - Secondary to renal failure vs sepsis vs mild DKA - Labs showed beta hydroxybutyrate elevation at 4.35 on presentation raising possibility of mild DKA.  Patient was placed on IV antibiotics.  Vancomycin discontinued as MRSA PCR negative. -Blood cultures negative so far, urine cultures negative.   -Leukocytosis improving, on oral Augmentin, only to cover Proteus from prior wound cultures, completed antibiotics.  per Dr. Fredna Dow, index finger looked good during dressing change.    Acute on chronic renal failure with hypernatremia, s/p renal transplant:  - Secondary to dehydration. Home diuretics (Lasix 80 mg twice daily) on hold.  - Seen by nephrology and started on bicarb supplemented D5W. -  Resumed chronic immunosuppressants/transplant medicines as able to take p.o. now. -Creatinine 2.6, plateaued, continue gentle hydration   Hypertensive urgency: - Patient presented with BP 224/102 in the setting of confusion and agitation.  -BP stable, for now continue to hold Lasix, beta-blocker, nitro patch,  Norvasc   Rhabdomyolysis, elevated CK - Patient with CK level~1500.  Likely secondary to falls, being on the floor.  Patient was placed on IV fluid hydration  Elevated troponin - In the setting of renal failure and possible demand ischemia from uncontrolled blood pressure. - EKG with nonspecific ST-T changes.   -Echocardiogram shows preserved EF   Chronic diastolic CHF:  - Repeat echo shows preserved EF. 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 Fredna Dow. - Supposed to have dressing change 9/9 in clinic per daughter.  Seen by Dr. Fredna Dow and recommended "ensure coban not placed too tight. If bandaids work better for maintaining wound coverage they may be used instead. Keep wound clean and dry. Encouraged gentle motion of other digits and hand to help with swelling. Okay to follow up after discharge. Contact if any concerns".    Acute gout -Much improved, patient moving all 4 extremities, feeling by himself -Continue oral prednisone, allopurinol  Superficial skin tears on extremities, pressure injury, left lower arm posterior skin tears, present on admission - Wound care recommendations appreciated   Code Status: Full code DVT Prophylaxis: Heparin Family Communication: Discussed all imaging results, lab results, explained to the patient and daughter Maudie Mercury at the  bedside on 9/12.  -Reassess with PT evaluation, patient family had requested discharge home if possible when medically ready   Disposition Plan:     Status is: Inpatient  Remains inpatient appropriate because:Inpatient level of care appropriate due to severity of illness   Dispo: The patient is from: Home              Anticipated d/c is to: SNF              Anticipated d/c date is: 2 days              Patient currently is not medically stable to d/c.      Time Spent in minutes   *35 minutes  Procedures:  None  Consultants:     Antimicrobials:   Anti-infectives (From admission, onward)   Start     Dose/Rate Route Frequency Ordered Stop   05/29/20 0100  amoxicillin-clavulanate (AUGMENTIN) 875-125 MG per tablet 1 tablet        1 tablet Oral Every 12 hours 05/29/20 0049 06/01/20 2231   05/29/20 0015  ciprofloxacin (CIPRO) tablet 500 mg  Status:  Discontinued        500 mg Oral 2 times daily 05/29/20 0004 05/29/20 0048   05/28/20 1800  ceFEPIme (MAXIPIME) 2 g in sodium chloride 0.9 % 100 mL IVPB  Status:  Discontinued        2 g 200 mL/hr over 30 Minutes Intravenous Every 12 hours 05/28/20 0950 05/28/20 1100   05/28/20 1400  ceFAZolin (ANCEF) IVPB 2g/100 mL premix  Status:  Discontinued        2 g 200 mL/hr over 30 Minutes Intravenous Every 8 hours 05/28/20 1100 05/29/20 0004   05/28/20 0800  vancomycin (VANCOREADY) IVPB 1500 mg/300 mL  Status:  Discontinued        1,500 mg 150 mL/hr over 120 Minutes Intravenous Every 48 hours 05/26/20 0943 05/27/20 1638   05/27/20 0600  ceFEPIme (MAXIPIME) 2 g in sodium chloride 0.9 % 100 mL IVPB  Status:  Discontinued        2 g 200 mL/hr over 30 Minutes Intravenous Every 24 hours 05/26/20 0935 05/28/20 0950   05/26/20 1015  ceFEPIme (MAXIPIME) 2 g in sodium chloride 0.9 % 100 mL IVPB  Status:  Discontinued        2 g 200 mL/hr over 30 Minutes Intravenous  Once 05/26/20 1004 05/26/20 1011   05/26/20 1015  metroNIDAZOLE (FLAGYL) IVPB 500  mg  Status:  Discontinued        500 mg 100 mL/hr over 60 Minutes Intravenous Every 8 hours 05/26/20 1004 05/28/20 1100   05/26/20 1015  vancomycin (VANCOCIN) IVPB 1000 mg/200 mL premix  Status:  Discontinued        1,000 mg 200 mL/hr over 60 Minutes Intravenous  Once 05/26/20 1004 05/26/20 1012   05/26/20 0515  ceFEPIme (MAXIPIME) 1 g in sodium chloride 0.9 % 100 mL IVPB  Status:  Discontinued        1 g 200 mL/hr over 30 Minutes Intravenous  Once 05/26/20 0502 05/26/20 0506   05/26/20 0515  ceFEPIme (MAXIPIME) 2 g in sodium chloride 0.9 % 100 mL IVPB        2 g 200 mL/hr over 30 Minutes Intravenous  Once 05/26/20 0506 05/26/20 0701   05/26/20 0515  vancomycin (VANCOREADY) IVPB 1750 mg/350 mL        1,750 mg 175 mL/hr over 120 Minutes Intravenous STAT 05/26/20 0507 05/26/20 0834         Medications  Scheduled Meds: . allopurinol  150 mg Oral Daily  . atorvastatin  10 mg Oral Daily  . Chlorhexidine Gluconate Cloth  6 each Topical Q0600  . heparin  5,000 Units Subcutaneous Q8H  . insulin aspart  0-15 Units Subcutaneous TID WC  . insulin aspart  0-5 Units Subcutaneous QHS  . insulin aspart  6 Units Subcutaneous TID WC  . insulin glargine  20 Units Subcutaneous QHS  . mouth rinse  15 mL Mouth Rinse BID  . mycophenolate  500 mg Oral BID  . predniSONE  40 mg Oral QAC breakfast  . sodium chloride flush  10-40 mL Intracatheter Q12H  . sodium chloride flush  3 mL Intravenous Q12H  . tacrolimus  1 mg Oral BID   Continuous Infusions: . sodium chloride 250 mL (05/31/20 0754)  . sodium chloride 100 mL/hr at 06/02/20 1041   PRN Meds:.sodium chloride, albuterol, fentaNYL (SUBLIMAZE) injection, hydrALAZINE, metoprolol tartrate, oxyCODONE-acetaminophen, sodium chloride flush      Subjective:   [name] was seen and examined today.  Alert and oriented  today, eating by himself, pain controlled in his hands and fingers.  Swelling decreased in the hands  No acute chest pain,  shortness of breath, abdominal pain, nausea vomiting or diarrhea.    Objective:   Vitals:   06/01/20 2042 06/02/20 0453 06/02/20 0500 06/02/20 1141  BP: 109/70 120/69  120/74  Pulse: 74 (!) 54  (!) 56  Resp: 20 18  18   Temp: 98.2 F (36.8 C) 98.1 F (36.7 C)  98.5 F (36.9 C)  TempSrc:    Oral  SpO2: 100% 99%  94%  Weight:   92.9 kg   Height:        Intake/Output Summary (Last 24 hours) at 06/02/2020 1517 Last data filed at 06/02/2020 1033 Gross per 24 hour  Intake 780 ml  Output 700 ml  Net 80 ml     Wt Readings from Last 3 Encounters:  06/02/20 92.9 kg  05/22/20 88.9 kg  04/09/20 90.3 kg    Physical Exam  General: Alert and oriented x 3, NAD  Cardiovascular: S1 S2 clear, RRR. No pedal edema b/l  Respiratory: CTAB, no wheezing, rales or rhonchi  Gastrointestinal: Soft, nontender, nondistended, NBS  Ext: no pedal edema bilaterally  Neuro: no new deficits  Musculoskeletal: No cyanosis, clubbing  Skin: Dressing in place in the right index finger, heel protectors  Psych: Normal affect and demeanor, alert and oriented x3    Data Reviewed:  I have personally reviewed following labs and imaging studies  Micro Results Recent Results (from the past 240 hour(s))  Blood culture (routine x 2)     Status: None   Collection Time: 05/26/20  4:42 AM   Specimen: BLOOD  Result Value Ref Range Status   Specimen Description   Final    BLOOD RIGHT HAND Performed at [facility] [address] [place], [place] [address]    Special Requests   Final    BOTTLES DRAWN AEROBIC AND ANAEROBIC Blood Culture results may not be optimal due to an inadequate volume of blood received in culture bottles Performed at [facility] [address], [place], [place] [address]    Culture   Final    NO GROWTH 5 DAYS Performed at [facility], [place] [address], [place], [place] [address]    Report Status 05/31/2020 FINAL  Final  Urine  Culture     Status: Abnormal   Collection Time: 05/26/20  4:42 AM   Specimen: Urine, Random  Result Value Ref Range Status   Specimen Description   Final    URINE, RANDOM Performed at [facility] [address] [place], [place] [address]    Special Requests   Final    NONE Performed at [facility], [place] [address], [place], [place] [address]    Culture (A)  Final    <10,000 COLONIES/mL INSIGNIFICANT GROWTH Performed at [facility] [address], [place],  [address]    Report Status 05/27/2020 FINAL  Final  SARS Coronavirus 2 by RT PCR (hospital order, performed in [facility] hospital lab) Nasopharyngeal Nasopharyngeal Swab     Status: None   Collection Time: 05/26/20  4:43 AM   Specimen: Nasopharyngeal Swab  Result Value Ref Range Status   SARS Coronavirus 2 NEGATIVE NEGATIVE Final    Comment: (NOTE) SARS-CoV-2 target nucleic acids are NOT DETECTED.  The SARS-CoV-2 RNA is generally detectable in upper and lower respiratory specimens during the acute phase of infection.  The lowest concentration of SARS-CoV-2 viral copies this assay can detect is 250 copies / mL. A negative result does not preclude SARS-CoV-2 infection and should not be used as the sole basis for treatment or other patient management decisions.  A negative result may occur with improper specimen collection / handling, submission of specimen other than nasopharyngeal swab, presence of viral mutation(s) within the areas targeted by this assay, and inadequate number of viral copies (<250 copies / mL). A negative result must be combined with clinical observations, patient history, and epidemiological information.  Fact Sheet for Patients:   StrictlyIdeas.no  Fact Sheet for Healthcare Providers: BankingDealers.co.za  This test is not yet approved or  cleared by the [place] FDA and has been authorized for  detection and/or diagnosis of SARS-CoV-2 by FDA under an Emergency Use Authorization (EUA).  This EUA will remain in effect (meaning this test can be used) for the duration of the COVID-19 declaration under Section 564(b)(1) of the Act, 21 U.S.C. section 360bbb-3(b)(1), unless the authorization is terminated or revoked sooner.  Performed at [facility], [place] [address] [place], [place] [address]   MRSA PCR Screening     Status: None   Collection Time: 05/27/20 10:05 AM   Specimen: Nasopharyngeal  Result Value Ref Range Status   MRSA by PCR NEGATIVE NEGATIVE Final    Comment:        The GeneXpert MRSA Assay (FDA approved for NASAL specimens only), is one component of a comprehensive MRSA colonization surveillance program. It is not intended to diagnose MRSA infection nor to guide or monitor treatment for MRSA infections. Performed at [facility], [place] [address], [place], [place] [address]     Radiology Reports CT ABDOMEN PELVIS WO CONTRAST  Result Date: 05/26/2020 CLINICAL DATA:  Fall, found down EXAM: CT CHEST, ABDOMEN AND PELVIS WITHOUT CONTRAST TECHNIQUE: Multidetector CT imaging of the chest, abdomen and pelvis was performed following the standard protocol without IV contrast. COMPARISON:  None. FINDINGS: Motion degraded images. CT CHEST FINDINGS Cardiovascular: Cardiomegaly. No pericardial effusion. Chronic pericardial calcifications. No evidence of thoracic aortic aneurysm. Atherosclerotic calcifications of the aortic arch. Enlargement of the main pulmonary artery, suggesting pulmonary arterial hypertension. Three vessel coronary atherosclerosis. Mediastinum/Nodes: No suspicious mediastinal lymphadenopathy. Visualized thyroid is unremarkable. Lungs/Pleura: Evaluation of the lung parenchyma is constrained by respiratory motion. Within that constraint, there are no suspicious pulmonary nodules. Small right and trace left pleural effusions.  Mild right basilar atelectasis.  No focal consolidation. No pneumothorax. Musculoskeletal: No acute fracture is seen. Specifically, the sternum, clavicles, and scapulae are intact. Ribs are poorly evaluated due to motion degradation. Old left rib fracture deformities without definite acute fracture. Dedicated thoracic spine evaluation will be reported separately. CT ABDOMEN PELVIS FINDINGS Hepatobiliary: Unenhanced liver is grossly unremarkable. No perihepatic fluid/hemorrhage. Suspected layering gallbladder sludge (series 2/image 25), without associated inflammatory changes. No intrahepatic or extrahepatic ductal dilatation. Pancreas: Within normal limits. Spleen: Within normal limits.  No perisplenic fluid/hemorrhage. Adrenals/Urinary Tract: Adrenal glands are within normal limits. Severe bilateral renal cortical atrophy with a dominant 3.6 cm right upper pole renal cyst. No hydronephrosis. Calcified, amorphous right lower quadrant renal transplant with sequela of rejection (series 2/image 54). Left lower quadrant renal transplant without hydronephrosis. Bladder is within normal limits. Stomach/Bowel: Stomach is within normal limits. No evidence of bowel obstruction. Normal appendix (series 2/image 22). Extensive left colonic diverticulosis, without evidence of diverticulitis. Vascular/Lymphatic: No evidence of abdominal aortic aneurysm. Extensive atherosclerotic calcifications the abdominal aorta  and branch vessels. No suspicious abdominopelvic lymphadenopathy. Reproductive: Prostate is grossly unremarkable. Other: No abdominopelvic ascites. No hemoperitoneum or free air. Musculoskeletal: No fracture is seen. Specifically, the visualized bony pelvis and proximal femurs are intact. Dedicated lumbar spine evaluation will be reported separately. IMPRESSION: Motion degraded images. No evidence of traumatic injury to the chest, abdomen, or pelvis. Dedicated thoracolumbar spine evaluation will be reported separately.  Small right and trace left pleural effusions. Additional chronic ancillary findings as above. Electronically Signed   By: Julian Hy M.D.   On: 05/26/2020 06:41   DG Chest 1 View  Result Date: 05/26/2020 CLINICAL DATA:  Altered mental status EXAM: CHEST  1 VIEW COMPARISON:  09/06/2018 FINDINGS: Moderate cardiomegaly. Hazy opacities in the right lung may be exaggerated by rotation. Right pleural effusion. IMPRESSION: Right pleural effusion and moderate cardiomegaly. Electronically Signed   By: Ulyses Jarred M.D.   On: 05/26/2020 06:48   DG Pelvis 1-2 Views  Result Date: 05/26/2020 CLINICAL DATA:  Fall found down EXAM: PELVIS - 1-2 VIEW COMPARISON:  None. FINDINGS: There is no evidence of pelvic fracture or diastasis. No pelvic bone lesions are seen. IMPRESSION: Negative. Electronically Signed   By: Ulyses Jarred M.D.   On: 05/26/2020 06:49   DG Forearm Right  Result Date: 05/26/2020 CLINICAL DATA:  Right forearm injury after fall. EXAM: RIGHT FOREARM - 2 VIEW COMPARISON:  None. FINDINGS: There is no evidence of fracture or other focal bone lesions. Vascular calcifications are noted. Postsurgical changes are noted in the antecubital region. IMPRESSION: Negative. Electronically Signed   By: Marijo Conception M.D.   On: 05/26/2020 09:15   CT Head Wo Contrast  Result Date: 05/26/2020 CLINICAL DATA:  Fall EXAM: CT HEAD WITHOUT CONTRAST CT CERVICAL SPINE WITHOUT CONTRAST TECHNIQUE: Multidetector CT imaging of the head and cervical spine was performed following the standard protocol without intravenous contrast. Multiplanar CT image reconstructions of the cervical spine were also generated. COMPARISON:  None. FINDINGS: CT HEAD FINDINGS Brain: There is no mass, hemorrhage or extra-axial collection. The size and configuration of the ventricles and extra-axial CSF spaces are normal. There is hypoattenuation of the periventricular white matter, most commonly indicating chronic ischemic microangiopathy.  Vascular: Atherosclerotic calcification of the vertebral and internal carotid arteries at the skull base. No abnormal hyperdensity of the major intracranial arteries or dural venous sinuses. Skull: Small hematoma at the scalp vertex.  No skull fracture. Sinuses/Orbits: No fluid levels or advanced mucosal thickening of the visualized paranasal sinuses. No mastoid or middle ear effusion. The orbits are normal. CT CERVICAL SPINE FINDINGS Alignment: No static subluxation. Facets are aligned. Occipital condyles are normally positioned. Skull base and vertebrae: No acute fracture. Soft tissues and spinal canal: No prevertebral fluid or swelling. No visible canal hematoma. Disc levels: C5-7 degenerative disc disease. Upper chest: No pneumothorax, pulmonary nodule or pleural effusion. Other: Normal visualized paraspinal cervical soft tissues. IMPRESSION: 1. 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 2. Small scalp vertex hematoma without skull fracture. 3. No acute fracture or static subluxation of the cervical spine. Electronically Signed   By: Ulyses Jarred M.D.   On: 05/26/2020 06:44   CT Chest Wo Contrast  Result Date: 05/26/2020 CLINICAL DATA:  Fall, found down EXAM: CT CHEST, ABDOMEN AND PELVIS WITHOUT CONTRAST TECHNIQUE: Multidetector CT imaging of the chest, abdomen and pelvis was performed following the standard protocol without IV contrast. COMPARISON:  None. FINDINGS: Motion degraded images. CT CHEST FINDINGS Cardiovascular: Cardiomegaly. No pericardial effusion. Chronic pericardial calcifications.  No evidence of thoracic aortic aneurysm. Atherosclerotic calcifications of the aortic arch. Enlargement of the main pulmonary artery, suggesting pulmonary arterial hypertension. Three vessel coronary atherosclerosis. Mediastinum/Nodes: No suspicious mediastinal lymphadenopathy. Visualized thyroid is unremarkable. Lungs/Pleura: Evaluation of the lung parenchyma is constrained by  respiratory motion. Within that constraint, there are no suspicious pulmonary nodules. Small right and trace left pleural effusions. Mild right basilar atelectasis.  No focal consolidation. No pneumothorax. Musculoskeletal: No acute fracture is seen. Specifically, the sternum, clavicles, and scapulae are intact. Ribs are poorly evaluated due to motion degradation. Old left rib fracture deformities without definite acute fracture. Dedicated thoracic spine evaluation will be reported separately. CT ABDOMEN PELVIS FINDINGS Hepatobiliary: Unenhanced liver is grossly unremarkable. No perihepatic fluid/hemorrhage. Suspected layering gallbladder sludge (series 2/image 25), without associated inflammatory changes. No intrahepatic or extrahepatic ductal dilatation. Pancreas: Within normal limits. Spleen: Within normal limits.  No perisplenic fluid/hemorrhage. Adrenals/Urinary Tract: Adrenal glands are within normal limits. Severe bilateral renal cortical atrophy with a dominant 3.6 cm right upper pole renal cyst. No hydronephrosis. Calcified, amorphous right lower quadrant renal transplant with sequela of rejection (series 2/image 54). Left lower quadrant renal transplant without hydronephrosis. Bladder is within normal limits. Stomach/Bowel: Stomach is within normal limits. No evidence of bowel obstruction. Normal appendix (series 2/image 22). Extensive left colonic diverticulosis, without evidence of diverticulitis. Vascular/Lymphatic: No evidence of abdominal aortic aneurysm. Extensive atherosclerotic calcifications the abdominal aorta and branch vessels. No suspicious abdominopelvic lymphadenopathy. Reproductive: Prostate is grossly unremarkable. Other: No abdominopelvic ascites. No hemoperitoneum or free air. Musculoskeletal: No fracture is seen. Specifically, the visualized bony pelvis and proximal femurs are intact. Dedicated lumbar spine evaluation will be reported separately. IMPRESSION: Motion degraded images. No  evidence of traumatic injury to the chest, abdomen, or pelvis. Dedicated thoracolumbar spine evaluation will be reported separately. Small right and trace left pleural effusions. Additional chronic ancillary findings as above. Electronically Signed   By: Julian Hy M.D.   On: 05/26/2020 06:41   CT Cervical Spine Wo Contrast  Result Date: 05/26/2020 CLINICAL DATA:  Fall EXAM: CT HEAD WITHOUT CONTRAST CT CERVICAL SPINE WITHOUT CONTRAST TECHNIQUE: Multidetector CT imaging of the head and cervical spine was performed following the standard protocol without intravenous contrast. Multiplanar CT image reconstructions of the cervical spine were also generated. COMPARISON:  None. FINDINGS: CT HEAD FINDINGS Brain: There is no mass, hemorrhage or extra-axial collection. The size and configuration of the ventricles and extra-axial CSF spaces are normal. There is hypoattenuation of the periventricular white matter, most commonly indicating chronic ischemic microangiopathy. Vascular: Atherosclerotic calcification of the vertebral and internal carotid arteries at the skull base. No abnormal hyperdensity of the major intracranial arteries or dural venous sinuses. Skull: Small hematoma at the scalp vertex.  No skull fracture. Sinuses/Orbits: No fluid levels or advanced mucosal thickening of the visualized paranasal sinuses. No mastoid or middle ear effusion. The orbits are normal. CT CERVICAL SPINE FINDINGS Alignment: No static subluxation. Facets are aligned. Occipital condyles are normally positioned. Skull base and vertebrae: No acute fracture. Soft tissues and spinal canal: No prevertebral fluid or swelling. No visible canal hematoma. Disc levels: C5-7 degenerative disc disease. Upper chest: No pneumothorax, pulmonary nodule or pleural effusion. Other: Normal visualized paraspinal cervical soft tissues. IMPRESSION: 1. 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 2. Small scalp vertex  hematoma without skull fracture. 3. No acute fracture or static subluxation of the cervical spine. Electronically Signed   By: Ulyses Jarred M.D.   On: 05/26/2020 06:44   MR  BRAIN WO CONTRAST  Result Date: 05/28/2020 CLINICAL DATA:  Delirium EXAM: MRI HEAD WITHOUT CONTRAST TECHNIQUE: Multiplanar, multiecho pulse sequences of the brain and surrounding structures were obtained without intravenous contrast. COMPARISON:  None. FINDINGS: Motion artifact is present. Brain: There is no acute infarction or intracranial hemorrhage. There is no intracranial mass, mass effect, or edema. There is no hydrocephalus or extra-axial fluid collection. Small chronic left occipital infarct. Minimal patchy T2 hyperintensity in the supratentorial white matter is nonspecific but may reflect minor chronic microvascular ischemic changes. Prominence of the ventricles and sulci reflects generalized parenchymal volume loss. Vascular: Major vessel flow voids at the skull base are preserved. Skull and upper cervical spine: Normal marrow signal is preserved. Sinuses/Orbits: Paranasal sinuses are aerated. Orbits are unremarkable. Other: Sella is unremarkable.  Mastoid air cells are clear. IMPRESSION: Motion degraded study. No evidence of recent infarction, hemorrhage, or mass. Small chronic left occipital infarct. Electronically Signed   By: Macy Mis M.D.   On: 05/28/2020 16:27   CT T-SPINE NO CHARGE  Result Date: 05/26/2020 CLINICAL DATA:  Fall EXAM: CT Thoracic and Lumbar spine without contrast TECHNIQUE: Multiplanar CT images of the thoracic and lumbar spine were reconstructed from contemporary CT of the Chest, Abdomen, and Pelvis CONTRAST:  No additional contrast COMPARISON:  None FINDINGS: CT THORACIC SPINE FINDINGS Alignment: Normal. Vertebrae: No acute fracture or focal pathologic process. Disc levels: No spinal canal stenosis CT LUMBAR SPINE FINDINGS Segmentation: 5 lumbar type vertebrae. Alignment: Normal. Vertebrae: No acute  fracture or focal pathologic process. Paraspinal and other soft tissues: Negative. Disc levels: No spinal canal stenosis. IMPRESSION: No acute fracture or static subluxation of the thoracic or lumbar spine. Electronically Signed   By: Ulyses Jarred M.D.   On: 05/26/2020 06:35   CT L-SPINE NO CHARGE  Result Date: 05/26/2020 CLINICAL DATA:  Fall EXAM: CT Thoracic and Lumbar spine without contrast TECHNIQUE: Multiplanar CT images of the thoracic and lumbar spine were reconstructed from contemporary CT of the Chest, Abdomen, and Pelvis CONTRAST:  No additional contrast COMPARISON:  None FINDINGS: CT THORACIC SPINE FINDINGS Alignment: Normal. Vertebrae: No acute fracture or focal pathologic process. Disc levels: No spinal canal stenosis CT LUMBAR SPINE FINDINGS Segmentation: 5 lumbar type vertebrae. Alignment: Normal. Vertebrae: No acute fracture or focal pathologic process. Paraspinal and other soft tissues: Negative. Disc levels: No spinal canal stenosis. IMPRESSION: No acute fracture or static subluxation of the thoracic or lumbar spine. Electronically Signed   By: Ulyses Jarred M.D.   On: 05/26/2020 06:35   DG CHEST PORT 1 VIEW  Result Date: 05/29/2020 CLINICAL DATA:  Central line placement EXAM: PORTABLE CHEST 1 VIEW.  Patient is slightly rotated. COMPARISON:  Chest x-ray 05/26/2020, CT chest 05/26/2020. FINDINGS: Interval placement of a left internal jugular central venous catheter with tip overlying the expected region of the superior cavoatrial junction. Moderate cardiomegaly the may be exaggerated due to portable AP technique. The heart size and mediastinal contours are unchanged. Aortic arch calcification. Hilar vasculature prominence. Improved aeration of bilateral lungs. No focal consolidation. No pulmonary edema. Persistent bilateral trace pleural effusions. No pneumothorax. No acute osseous abnormality.  Old healed left rib fractures. IMPRESSION: New left internal jugular central venous catheter with  tip overlying the expected region of the superior cavoatrial junction. Persistent bilateral trace pleural effusions in a patient with moderate cardiomegaly. Electronically Signed   By: Iven Finn M.D.   On: 05/29/2020 13:01   DG Hand Complete Right  Result Date: 05/26/2020 CLINICAL DATA:  Right hand pain after fall. EXAM: RIGHT HAND - COMPLETE 3+ VIEW COMPARISON:  None. 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 Vascular calcifications are noted. No other bony abnormality is noted. Joint spaces are intact. IMPRESSION: Status post amputation of a portion of second middle phalanx as well as the second distal phalanx. Electronically Signed   By: Marijo Conception M.D.   On: 05/26/2020 09:13   ECHOCARDIOGRAM COMPLETE  Result Date: 05/26/2020    ECHOCARDIOGRAM REPORT   Patient Name:   JAWARA LATORRE Date of Exam: 05/26/2020 Medical Rec #:  [id number]       Height:       69.0 in Accession #:    [id number]      Weight:       196.0 lb Date of Birth:  July 04, 1946      BSA:          2.048 m Patient Age:    50 years        BP:           161/69 mmHg Patient Gender: M               HR:           74 bpm. Exam Location:  Inpatient Procedure: 2D Echo, Cardiac Doppler and Color Doppler Indications:    CHF-Acute Diastolic 892.11 / H41.74  History:        Patient has prior history of Echocardiogram examinations, most                 recent 03/01/2019. CHF, Arrythmias:Atrial Fibrillation; Risk                 Factors:Dyslipidemia, Hypertension, Diabetes and Non-Smoker.  Sonographer:    Vickie Epley RDCS Referring Phys: [id number] [place]  1. Left ventricular ejection fraction, by estimation, is 55 to 60%. The left ventricle has normal function. The left ventricle has no regional wall motion abnormalities. Left ventricular diastolic parameters are indeterminate. Elevated left ventricular end-diastolic pressure.  2. Right ventricular systolic  function is normal. The right ventricular size is normal. Tricuspid regurgitation signal is inadequate for assessing PA pressure.  3. The mitral valve is normal in structure. No evidence of mitral valve regurgitation. No evidence of mitral stenosis.  4. The aortic valve is tricuspid. Aortic valve regurgitation is trivial. Mild aortic valve sclerosis is present, with no evidence of aortic valve stenosis.  5. There is mild dilatation of the aortic root measuring 42 mm.  6. The inferior vena cava is dilated in size with <50% respiratory variability, suggesting right atrial pressure of 15 mmHg. FINDINGS  Left Ventricle: Left ventricular ejection fraction, by estimation, is 55 to 60%. The left ventricle has normal function. The left ventricle has no regional wall motion abnormalities. The left ventricular internal cavity size was normal in size. There is  no left ventricular hypertrophy. Left ventricular diastolic parameters are indeterminate. Elevated left ventricular end-diastolic pressure. Right Ventricle: The right ventricular size is normal. No increase in right ventricular wall thickness. Right ventricular systolic function is normal. Tricuspid regurgitation signal is inadequate for assessing PA pressure. Left Atrium: Left atrial size was normal in size. Right Atrium: Right atrial size was normal in size. Pericardium: There is no evidence of pericardial effusion. Mitral Valve: The mitral valve is normal in structure. Normal mobility of the mitral valve leaflets. Mild mitral annular calcification. No evidence of mitral valve  regurgitation. No evidence of mitral valve stenosis. Tricuspid Valve: The tricuspid valve is normal in structure. Tricuspid valve regurgitation is trivial. No evidence of tricuspid stenosis. Aortic Valve: The aortic valve is tricuspid. Aortic valve regurgitation is trivial. Mild aortic valve sclerosis is present, with no evidence of aortic valve stenosis. Pulmonic Valve: The pulmonic valve was  normal in structure. Pulmonic valve regurgitation is not visualized. No evidence of pulmonic stenosis. Aorta: The aortic root is normal in size and structure. There is mild dilatation of the aortic root measuring 42 mm. Venous: The inferior vena cava is dilated in size with less than 50% respiratory variability, suggesting right atrial pressure of 15 mmHg. IAS/Shunts: No atrial level shunt detected by color flow Doppler.  LEFT VENTRICLE PLAX 2D LVIDd:         4.70 cm      Diastology LVIDs:         3.30 cm      LV e' lateral:   11.10 cm/s LV PW:         1.00 cm      LV E/e' lateral: 12.1 LV IVS:        1.00 cm      LV e' medial:    7.73 cm/s LVOT diam:     2.80 cm      LV E/e' medial:  17.3 LV SV:         100 LV SV Index:   49 LVOT Area:     6.16 cm  LV Volumes (MOD) LV vol d, MOD A2C: 103.0 ml LV vol d, MOD A4C: 119.0 ml LV vol s, MOD A2C: 43.4 ml LV vol s, MOD A4C: 56.6 ml LV SV MOD A2C:     59.6 ml LV SV MOD A4C:     119.0 ml LV SV MOD BP:      61.0 ml RIGHT VENTRICLE RV S prime:     10.60 cm/s TAPSE (M-mode): 1.2 cm LEFT ATRIUM             Index       RIGHT ATRIUM           Index LA diam:        3.60 cm 1.76 cm/m  RA Area:     17.90 cm LA Vol (A2C):   69.0 ml 33.69 ml/m RA Volume:   53.10 ml  25.92 ml/m LA Vol (A4C):   54.5 ml 26.61 ml/m LA Biplane Vol: 67.1 ml 32.76 ml/m  AORTIC VALVE LVOT Vmax:   87.20 cm/s LVOT Vmean:  57.700 cm/s LVOT VTI:    0.162 m  AORTA Ao Root diam: 4.20 cm MITRAL VALVE MV Area (PHT): 3.48 cm     SHUNTS MV Decel Time: 218 msec     Systemic VTI:  0.16 m MV E velocity: 134.00 cm/s  Systemic Diam: 2.80 cm MV A velocity: 41.60 cm/s MV E/A ratio:  3.22 Fransico Him MD Electronically signed by Fransico Him MD Signature Date/Time: 05/26/2020/12:12:24 PM    Final     Lab Data:  CBC: Recent Labs  Lab 05/29/20 0249 05/30/20 1110 05/31/20 0653 06/01/20 0939 06/02/20 0329  WBC 11.8* 11.6* 14.6* 13.0* 9.8  HGB 9.3* 9.1* 8.5* 7.4* 7.3*  HCT 31.1* 30.1* 28.9* 24.7* 24.1*  MCV 96.6  95.9 96.7 95.4 94.1  PLT 120* 107* 96* 81* 87*   Basic Metabolic Panel: Recent Labs  Lab 05/28/20 0302 05/28/20 0302 05/29/20 0249 05/29/20 0249 05/29/20 2048 05/30/20 1110 05/31/20 3267 06/01/20 1245  06/02/20 0329  NA 152*   < > 149*   < > 147* 145 141 134* 133*  K 4.6   < > 3.6   < > 4.3 3.9 4.1 4.5 4.9  CL 119*   < > 120*   < > 116* 111 106 103 102  CO2 19*   < > 22   < > 24 24 23  20* 20*  GLUCOSE 313*   < > 172*   < > 235* 151* 205* 221* 286*  BUN 53*   < > 54*   < > 49* 46* 45* 59* 78*  CREATININE 2.03*   < > 2.00*  2.06*   < > 1.95* 1.86* 2.08* 2.65* 2.66*  CALCIUM 9.2   < > 9.0   < > 8.9 8.6* 8.4* 8.2* 8.3*  MG  --   --   --   --  1.8  --   --   --   --   PHOS 2.7  --  1.8*  --   --  2.2*  --   --   --    < > = values in this interval not displayed.   GFR: Estimated Creatinine Clearance: 28.3 mL/min (A) (by C-G formula based on SCr of 2.66 mg/dL (H)). Liver Function Tests: Recent Labs  Lab 05/27/20 0410 05/28/20 0302 05/29/20 0249 05/30/20 1110  AST 57*  --   --   --   ALT 16  --   --   --   ALKPHOS 59  --   --   --   BILITOT 1.2  --   --   --   PROT 5.9*  --   --   --   ALBUMIN 3.3* 3.2* 3.0* 2.9*   No results for input(s): LIPASE, AMYLASE in the last 168 hours. No results for input(s): AMMONIA in the last 168 hours. Coagulation Profile: No results for input(s): INR, PROTIME in the last 168 hours. Cardiac Enzymes: No results for input(s): CKTOTAL, CKMB, CKMBINDEX, TROPONINI in the last 168 hours. BNP (last 3 results) No results for input(s): PROBNP in the last 8760 hours. HbA1C: No results for input(s): HGBA1C in the last 72 hours. CBG: Recent Labs  Lab 06/01/20 1134 06/01/20 1642 06/01/20 2042 06/02/20 0736 06/02/20 1137  GLUCAP 208* 242* 272* 273* 354*   Lipid Profile: No results for input(s): CHOL, HDL, LDLCALC, TRIG, CHOLHDL, LDLDIRECT in the last 72 hours. Thyroid Function Tests: No results for input(s): TSH, T4TOTAL, FREET4, T3FREE,  THYROIDAB in the last 72 hours. Anemia Panel: No results for input(s): VITAMINB12, FOLATE, FERRITIN, TIBC, IRON, RETICCTPCT in the last 72 hours. Urine analysis:    Component Value Date/Time   COLORURINE YELLOW 05/26/2020 0441   APPEARANCEUR CLEAR 05/26/2020 0441   LABSPEC 1.020 05/26/2020 0441   PHURINE 5.5 05/26/2020 0441   GLUCOSEU NEGATIVE 05/26/2020 0441   HGBUR LARGE (A) 05/26/2020 0441   BILIRUBINUR NEGATIVE 05/26/2020 0441   KETONESUR 40 (A) 05/26/2020 0441   PROTEINUR 100 (A) 05/26/2020 0441   NITRITE NEGATIVE 05/26/2020 0441   LEUKOCYTESUR NEGATIVE 05/26/2020 0441     Makhiya Coburn M.D. [facility] 06/02/2020, 3:17 PM   Call night coverage person covering after 7pm

## 2020-06-02 NOTE — Progress Notes (Signed)
Patient is A/O x 4 with intermittent confusion; pain is more controlled at rest, no distress noted will continue present plan of care.

## 2020-06-02 NOTE — Progress Notes (Signed)
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  Patient apparently awake but confused, complaining of right-sided neck and back pain. 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 Fredna Dow on 9/2/202.  Pt admitted for Acute metabolic encephalopathy: Present on admission.  Likely multifactorial secondary to profound dehydration, hypernatremia, AKI and opiate use    PT Comments    Pt agreeable to mobilize however requiring increased time as pt encouraged to perform as much as able since family wishes for pt to d/c home.  Pt still required max assist at best for bed mobility and was unable to stand with +1 assist.  Continue to recommend SNF due to increased assist required.   Follow Up Recommendations  SNF     Equipment Recommendations  Wheelchair (measurements PT);Hospital bed    Recommendations for Other Services       Precautions / Restrictions Precautions Precautions: Fall Precaution Comments: warn pt prior to assist, pain behaviors with any touching but especially R UE Restrictions Weight Bearing Restrictions: No    Mobility  Bed Mobility Overal bed mobility: Needs Assistance Bed Mobility: Supine to Sit;Sit to Supine     Supine to sit: Max assist Sit to supine: Total assist;+2 for physical assistance   General bed mobility comments: assisted pt with moving L LE, required bed pad assist to position hips/scooting, assist with L UE to bring trunk upright  Transfers Overall transfer level: Needs assistance Equipment used:  (provided chair for L hand  to hold, pt not gripping with R hand due to pain) Transfers: Sit to/from Stand Sit to Stand: Total assist         General transfer comment: attempted x5 however pt unable to bring buttocks off bed with +1 assist (would need lift or +2)  Ambulation/Gait                 Stairs             Wheelchair Mobility    Modified Rankin (Stroke Patients Only)       Balance Overall balance assessment: Needs assistance Sitting-balance support: No upper extremity supported;Feet supported Sitting balance-Leahy Scale: Fair                                      Cognition Arousal/Alertness: Awake/alert Behavior During Therapy: WFL for tasks assessed/performed Overall Cognitive Status: Impaired/Different from baseline                         Following Commands: Follows one step commands consistently Safety/Judgement: Decreased awareness of safety;Decreased awareness of deficits            Exercises      General Comments        Pertinent Vitals/Pain Pain Assessment: Faces Faces Pain Scale: Hurts even more Pain Location: L leg and R arm mostly Pain Descriptors / Indicators: Grimacing;Guarding;Moaning Pain Intervention(s): Limited activity within patient's tolerance;Repositioned;Monitored during session    Home Living  Prior Function            PT Goals (current goals can now be found in the care plan section) Progress towards PT goals: Progressing toward goals    Frequency    Min 2X/week      PT Plan Current plan remains appropriate    Co-evaluation              AM-PAC PT "6 Clicks" Mobility   Outcome Measure  Help needed turning from your back to your side while in a flat bed without using bedrails?: Total Help needed moving from lying on your back to sitting on the side of a flat bed without using bedrails?: Total Help needed moving to and from a bed to a chair (including a  wheelchair)?: Total Help needed standing up from a chair using your arms (e.g., wheelchair or bedside chair)?: Total Help needed to walk in hospital room?: Total Help needed climbing 3-5 steps with a railing? : Total 6 Click Score: 6    End of Session Equipment Utilized During Treatment: Gait belt Activity Tolerance: Patient limited by fatigue Patient left: in bed;with call bell/phone within reach;with bed alarm set;with family/visitor present Nurse Communication: Mobility status PT Visit Diagnosis: Difficulty in walking, not elsewhere classified (R26.2);Muscle weakness (generalized) (M62.81)     Time: 1517-1600 PT Time Calculation (min) (ACUTE ONLY): 43 min  Charges:  $Therapeutic Activity: 38-52 mins                    Jannette Spanner PT, DPT Acute Rehabilitation Services Pager: [phone] Office: [phone]  Trena Platt 06/02/2020, 4:28 PM

## 2020-06-03 LAB — BASIC METABOLIC PANEL
Anion gap: 10 (ref 5–15)
BUN: 82 mg/dL — ABNORMAL HIGH (ref 8–23)
CO2: 19 mmol/L — ABNORMAL LOW (ref 22–32)
Calcium: 8.1 mg/dL — ABNORMAL LOW (ref 8.9–10.3)
Chloride: 105 mmol/L (ref 98–111)
Creatinine, Ser: 2.73 mg/dL — ABNORMAL HIGH (ref 0.61–1.24)
GFR calc Af Amer: 26 mL/min — ABNORMAL LOW (ref >60)
GFR calc non Af Amer: 22 mL/min — ABNORMAL LOW (ref >60)
Glucose, Bld: 146 mg/dL — ABNORMAL HIGH (ref 70–99)
Potassium: 4.2 mmol/L (ref 3.5–5.1)
Sodium: 134 mmol/L — ABNORMAL LOW (ref 135–145)

## 2020-06-03 LAB — IRON AND TIBC
Iron: 84 ug/dL (ref 45–182)
Saturation Ratios: 45 % — ABNORMAL HIGH (ref 17.9–39.5)
TIBC: 186 ug/dL — ABNORMAL LOW (ref 250–450)
UIBC: 102 ug/dL

## 2020-06-03 LAB — VITAMIN B12: Vitamin B-12: 415 pg/mL (ref 180–914)

## 2020-06-03 LAB — FOLATE: Folate: 6.8 ng/mL (ref >5.9)

## 2020-06-03 LAB — PREPARE RBC (CROSSMATCH)

## 2020-06-03 LAB — RETICULOCYTES
Immature Retic Fract: 22.9 % — ABNORMAL HIGH (ref 2.3–15.9)
RBC.: 2.75 MIL/uL — ABNORMAL LOW (ref 4.22–5.81)
Retic Count, Absolute: 51.7 10*3/uL (ref 19.0–186.0)
Retic Ct Pct: 1.9 % (ref 0.4–3.1)

## 2020-06-03 LAB — ALBUMIN: Albumin: 2.5 g/dL — ABNORMAL LOW (ref 3.5–5.0)

## 2020-06-03 LAB — CBC
HCT: 23.5 % — ABNORMAL LOW (ref 39.0–52.0)
Hemoglobin: 7.2 g/dL — ABNORMAL LOW (ref 13.0–17.0)
MCH: 28.7 pg (ref 26.0–34.0)
MCHC: 30.6 g/dL (ref 30.0–36.0)
MCV: 93.6 fL (ref 80.0–100.0)
Platelets: 112 10*3/uL — ABNORMAL LOW (ref 150–400)
RBC: 2.51 MIL/uL — ABNORMAL LOW (ref 4.22–5.81)
RDW: 13.6 % (ref 11.5–15.5)
WBC: 10.3 10*3/uL (ref 4.0–10.5)
nRBC: 0 % (ref 0.0–0.2)

## 2020-06-03 LAB — GLUCOSE, CAPILLARY
Glucose-Capillary: 124 mg/dL — ABNORMAL HIGH (ref 70–99)
Glucose-Capillary: 159 mg/dL — ABNORMAL HIGH (ref 70–99)
Glucose-Capillary: 220 mg/dL — ABNORMAL HIGH (ref 70–99)
Glucose-Capillary: 247 mg/dL — ABNORMAL HIGH (ref 70–99)

## 2020-06-03 LAB — FERRITIN: Ferritin: 313 ng/mL (ref 24–336)

## 2020-06-03 LAB — ABO/RH: ABO/RH(D): O POS

## 2020-06-03 MED ORDER — GABAPENTIN 100 MG PO CAPS
100.0000 mg | ORAL_CAPSULE | Freq: Two times a day (BID) | ORAL | Status: DC
  Administered 2020-06-03 – 2020-06-06 (×7): 100 mg via ORAL
  Filled 2020-06-03 (×7): qty 1

## 2020-06-03 MED ORDER — ENSURE ENLIVE PO LIQD
237.0000 mL | Freq: Two times a day (BID) | ORAL | Status: DC
  Administered 2020-06-03 – 2020-06-06 (×4): 237 mL via ORAL

## 2020-06-03 MED ORDER — SODIUM CHLORIDE 0.9% IV SOLUTION: Freq: Once | INTRAVENOUS | Status: DC

## 2020-06-03 MED ORDER — GABAPENTIN 100 MG PO CAPS: 100.0000 mg | ORAL_CAPSULE | Freq: Three times a day (TID) | ORAL | Status: DC

## 2020-06-03 NOTE — Progress Notes (Signed)
[facility]                                                                              Patient Demographics  [name], is a 74 y.o. male, DOB - [birth year], MWU:[id number]  Admit date - 05/26/2020   Admitting Physician Harold Hedge, MD  Outpatient Primary MD for the patient is [name], MD  Outpatient specialists:   LOS - 8  days   Medical records reviewed and are as summarized below:    Chief Complaint  Patient presents with  . 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  Patient apparently awake but confused, complaining of right-sided neck and back pain. 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Tissue culture revealed rare gram.positive cocci, pansensitive Proteus. Patient was given Norco for pain control. Approximately 8 hours prior to admission to the ED patient was difficult to arouse. At baseline he is normally conversant and answers questions normally butwifeis concerned the pain medication (Norco) he took from his hand surgery made him delirious. ED Course: T100.38F,HR110, RR 20, BP 224/102, SPO2 93% on room air. Noted to be confused/belligerent and yelling,required chemical sedation Na149, K5.4, bicarb 12, glucose 152, BUN 74, creatinine 2.71, AG 15, AST 63, ALT 14, T bili 1.4, CK 1566, troponin 182->226, EKG normal sinus rhythm with nonspecific ST-T changes in antero-lateral leads with PVCs.  QTC 421 ms.  WBC 12.0, Hb 9.8, platelets 111, INR 1.5, UA negative, UDS positive for opiates.Trauma work-up in the ED (CT chest abdomen pelvis, thoracic/lumbar spine  x-rays) negative except for small pleural effusions bilaterally.He received cefepime, vancomycin, 1 L NS bolus,, labetalol 20 mg x 1, Geodon milligrams x1.   Assessment & Plan    Principal Problem: Acute metabolic encephalopathy, present on admission -  Likely multifactorial secondary to profound dehydration, hypernatremia, AKI and opiate use.  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  -  Appeared to be dehydration on admission, UA + ketones likely due to poor oral intake. - Urine drug screen positive for opiates which he was prescribed on last discharge.   -Tacrolimus level resulted as low normal at 2.2. -Sodium level has improved to 134, patient is alert and oriented, at his baseline mental status  Acute metabolic acidosis, POA - Secondary to renal failure vs sepsis vs mild DKA - Labs showed beta hydroxybutyrate elevation at 4.35 on presentation raising possibility of mild DKA.  Patient was placed on IV antibiotics.  Vancomycin discontinued as MRSA PCR negative. -Blood cultures negative so far, urine cultures negative.   -Patient was placed on Augmentin only to cover Proteus from prior wound  cultures, he has completed antibiotics. per Dr. Fredna Dow, index finger looked good during dressing change.   Acute on chronic renal failure with hypernatremia, s/p renal transplant:  - Secondary to dehydration. Home diuretics (Lasix 80 mg twice daily) was placed on hold. Seen by nephrology and was started on bicarb supplemented D5W. -Tacrolimus, Prograf and prednisone were resumed -Once patient has completed 40 mg of prednisone x 5 days for acute gout attack, needs to start prednisone 5 mg daily his maintenance dose for history of renal transplant -Creatinine continues to rise, will reconsult nephrology, discussed with Dr.  Osborne Casco.  Lasix, losartan currently on hold, bicarb starting to trend down again  Normocytic anemia -Hemoglobin also slowly trending down, likely has some hemodilution due to IV fluids, hemoglobin 7.2 -Obtain anemia panel, FOBT, transfuse 1 unit pRBC  Hypertensive urgency: - Patient presented with BP 224/102 in the setting of confusion and agitation.  -BP currently stable, continue to hold Lasix, beta-blocker, nitro patch, Norvasc, resume gradually   Rhabdomyolysis, elevated CK - Patient with CK level~1500.  Likely secondary to falls, being on the floor.  Patient was placed on IV fluid hydration -Statin was placed on hold  Elevated troponin - In the setting of renal failure and possible demand ischemia from uncontrolled blood pressure. - EKG with nonspecific ST-T changes.   -Echocardiogram shows preserved EF   Chronic diastolic CHF:  - Repeat echo shows preserved EF. 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 Fredna Dow. - Supposed to have dressing change 9/9 in clinic per daughter.  Seen by Dr. Fredna Dow and recommended "ensure coban not placed too tight. If bandaids work better for maintaining wound coverage they may be used instead. Keep wound clean and dry. Encouraged gentle motion of other digits and  hand to help with swelling. Okay to follow up after discharge. Contact if any concerns".    Acute gout -Much improved, moving all 4 extremities -Continue oral prednisone, allopurinol -Resume gabapentin -Currently on prednisone 40 mg daily for 5 days for acute gout attack  Superficial skin tears on extremities, pressure injury, left lower arm posterior skin tears, present on admission - Wound care recommendations appreciated   Code Status: Full code DVT Prophylaxis: Heparin Family Communication: Discussed all imaging results, lab results, explained to the patient and daughter Andria Frames at bed side.  Patient's family had requested possible DC home with home health however PT evaluation 9/13 recommended SNF.  Patient's family is agreeable with short-term rehab.   Disposition Plan:     Status is: Inpatient  Remains inpatient appropriate because:Inpatient level of care appropriate due to severity of illness   Dispo: The patient is from: Home              Anticipated d/c is to: SNF              Anticipated d/c date is: 2 days  Patient currently is not medically stable to d/c.      Time Spent in minutes   *35 minutes  Procedures:  None  Consultants:     Antimicrobials:   Anti-infectives (From admission, onward)   Start     Dose/Rate Route Frequency Ordered Stop   05/29/20 0100  amoxicillin-clavulanate (AUGMENTIN) 875-125 MG per tablet 1 tablet        1 tablet Oral Every 12 hours 05/29/20 0049 06/01/20 2231   05/29/20 0015  ciprofloxacin (CIPRO) tablet 500 mg  Status:  Discontinued        500 mg Oral 2 times daily 05/29/20 0004 05/29/20 0048   05/28/20 1800  ceFEPIme (MAXIPIME) 2 g in sodium chloride 0.9 % 100 mL IVPB  Status:  Discontinued        2 g 200 mL/hr over 30 Minutes Intravenous Every 12 hours 05/28/20 0950 05/28/20 1100   05/28/20 1400  ceFAZolin (ANCEF) IVPB 2g/100 mL premix  Status:  Discontinued        2 g 200 mL/hr over 30 Minutes Intravenous  Every 8 hours 05/28/20 1100 05/29/20 0004   05/28/20 0800  vancomycin (VANCOREADY) IVPB 1500 mg/300 mL  Status:  Discontinued        1,500 mg 150 mL/hr over 120 Minutes Intravenous Every 48 hours 05/26/20 0943 05/27/20 1638   05/27/20 0600  ceFEPIme (MAXIPIME) 2 g in sodium chloride 0.9 % 100 mL IVPB  Status:  Discontinued        2 g 200 mL/hr over 30 Minutes Intravenous Every 24 hours 05/26/20 0935 05/28/20 0950   05/26/20 1015  ceFEPIme (MAXIPIME) 2 g in sodium chloride 0.9 % 100 mL IVPB  Status:  Discontinued        2 g 200 mL/hr over 30 Minutes Intravenous  Once 05/26/20 1004 05/26/20 1011   05/26/20 1015  metroNIDAZOLE (FLAGYL) IVPB 500 mg  Status:  Discontinued        500 mg 100 mL/hr over 60 Minutes Intravenous Every 8 hours 05/26/20 1004 05/28/20 1100   05/26/20 1015  vancomycin (VANCOCIN) IVPB 1000 mg/200 mL premix  Status:  Discontinued        1,000 mg 200 mL/hr over 60 Minutes Intravenous  Once 05/26/20 1004 05/26/20 1012   05/26/20 0515  ceFEPIme (MAXIPIME) 1 g in sodium chloride 0.9 % 100 mL IVPB  Status:  Discontinued        1 g 200 mL/hr over 30 Minutes Intravenous  Once 05/26/20 0502 05/26/20 0506   05/26/20 0515  ceFEPIme (MAXIPIME) 2 g in sodium chloride 0.9 % 100 mL IVPB        2 g 200 mL/hr over 30 Minutes Intravenous  Once 05/26/20 0506 05/26/20 0701   05/26/20 0515  vancomycin (VANCOREADY) IVPB 1750 mg/350 mL        1,750 mg 175 mL/hr over 120 Minutes Intravenous STAT 05/26/20 0507 05/26/20 0834         Medications  Scheduled Meds: . allopurinol  150 mg Oral Daily  . atorvastatin  10 mg Oral Daily  . Chlorhexidine Gluconate Cloth  6 each Topical Q0600  . feeding supplement (ENSURE ENLIVE)  237 mL Oral BID BM  . gabapentin  100 mg Oral BID  . heparin  5,000 Units Subcutaneous Q8H  . insulin aspart  0-20 Units Subcutaneous TID WC  . insulin aspart  0-5 Units Subcutaneous QHS  . insulin aspart  6 Units Subcutaneous TID WC  . insulin glargine  20 Units  Subcutaneous QHS  . mouth rinse  15 mL Mouth Rinse BID  . mycophenolate  500 mg Oral BID  . predniSONE  40 mg Oral QAC breakfast  . sodium chloride flush  10-40 mL Intracatheter Q12H  . sodium chloride flush  3 mL Intravenous Q12H  . tacrolimus  1 mg Oral BID   Continuous Infusions: . sodium chloride 250 mL (05/31/20 0754)  . sodium chloride 100 mL/hr at 06/03/20 0601   PRN Meds:.sodium chloride, albuterol, fentaNYL (SUBLIMAZE) injection, hydrALAZINE, metoprolol tartrate, oxyCODONE-acetaminophen, sodium chloride flush      Subjective:   Natalio Salois was seen and examined today.  Patient much more alert and oriented, close to his baseline mental status.  Pain improved in his hands, fingers and knees.  Swelling in the hands resolved.   No acute chest pain, shortness of breath, abdominal pain, nausea vomiting or diarrhea.    Objective:   Vitals:   06/02/20 2131 06/03/20 0614 06/03/20 0616 06/03/20 1211  BP: (!) 108/58 119/69  119/68  Pulse: (!) 53 (!) 46 (!) 53 (!) 54  Resp: 18 18  18   Temp: 98 F (36.7 C) 97.7 F (36.5 C)  98.5 F (36.9 C)  TempSrc: Oral Oral  Oral  SpO2: 97%  100% 99%  Weight:      Height:        Intake/Output Summary (Last 24 hours) at 06/03/2020 1357 Last data filed at 06/03/2020 1000 Gross per 24 hour  Intake 3076.94 ml  Output 500 ml  Net 2576.94 ml     Wt Readings from Last 3 Encounters:  06/02/20 92.9 kg  05/22/20 88.9 kg  04/09/20 90.3 kg    Physical Exam  General: Alert and oriented x 3, NAD  Cardiovascular: S1 S2 clear, RRR.   Respiratory: Decreased breath sound at the bases  Gastrointestinal: Soft, nontender, nondistended, NBS  Ext: no pedal edema bilaterally  Neuro: no new deficits  Musculoskeletal: No cyanosis, clubbing  Skin: Dressing in place in the right index finger, heel protectors  Psych: Normal affect and demeanor, alert and oriented x3   Data Reviewed:  I have personally reviewed following labs and imaging  studies  Micro Results Recent Results (from the past 240 hour(s))  Blood culture (routine x 2)     Status: None   Collection Time: 05/26/20  4:42 AM   Specimen: BLOOD  Result Value Ref Range Status   Specimen Description   Final    BLOOD RIGHT HAND Performed at [facility] [address], [place], [place] [address]    Special Requests   Final    BOTTLES DRAWN AEROBIC AND ANAEROBIC Blood Culture results may not be optimal due to an inadequate volume of blood received in culture bottles Performed at [facility] [address] [place], [place] [address]    Culture   Final    NO GROWTH 5 DAYS Performed at [facility], [place] [address] [place], [place] [address]    Report Status 05/31/2020 FINAL  Final  Urine Culture     Status: Abnormal   Collection Time: 05/26/20  4:42 AM   Specimen: Urine, Random  Result Value Ref Range Status   Specimen Description   Final    URINE, RANDOM Performed at [facility] [address], [place], [place] [address]    Special Requests   Final    NONE Performed at [facility], [place] [address] [place], [place] [address]  Culture (A)  Final    <10,000 COLONIES/mL INSIGNIFICANT GROWTH Performed at [facility] [address], [place], [place] [address]    Report Status 05/27/2020 FINAL  Final  SARS Coronavirus 2 by RT PCR (hospital order, performed in [facility] hospital lab) Nasopharyngeal Nasopharyngeal Swab     Status: None   Collection Time: 05/26/20  4:43 AM   Specimen: Nasopharyngeal Swab  Result Value Ref Range Status   SARS Coronavirus 2 NEGATIVE NEGATIVE Final    Comment: (NOTE) SARS-CoV-2 target nucleic acids are NOT DETECTED.  The SARS-CoV-2 RNA is generally detectable in upper and lower respiratory specimens during the acute phase of infection. The lowest concentration of SARS-CoV-2 viral copies this assay can detect is 250 copies / mL. A  negative result does not preclude SARS-CoV-2 infection and should not be used as the sole basis for treatment or other patient management decisions.  A negative result may occur with improper specimen collection / handling, submission of specimen other than nasopharyngeal swab, presence of viral mutation(s) within the areas targeted by this assay, and inadequate number of viral copies (<250 copies / mL). A negative result must be combined with clinical observations, patient history, and epidemiological information.  Fact Sheet for Patients:   StrictlyIdeas.no  Fact Sheet for Healthcare Providers: BankingDealers.co.za  This test is not yet approved or  cleared by the [place] FDA and has been authorized for detection and/or diagnosis of SARS-CoV-2 by FDA under an Emergency Use Authorization (EUA).  This EUA will remain in effect (meaning this test can be used) for the duration of the COVID-19 declaration under Section 564(b)(1) of the Act, 21 U.S.C. section 360bbb-3(b)(1), unless the authorization is terminated or revoked sooner.  Performed at [facility], [place] [address], [place], [place] [address]   MRSA PCR Screening     Status: None   Collection Time: 05/27/20 10:05 AM   Specimen: Nasopharyngeal  Result Value Ref Range Status   MRSA by PCR NEGATIVE NEGATIVE Final    Comment:        The GeneXpert MRSA Assay (FDA approved for NASAL specimens only), is one component of a comprehensive MRSA colonization surveillance program. It is not intended to diagnose MRSA infection nor to guide or monitor treatment for MRSA infections. Performed at [facility], [place] [address] [place], [place] [address]     Radiology Reports CT ABDOMEN PELVIS WO CONTRAST  Result Date: 05/26/2020 CLINICAL DATA:  Fall, found down EXAM: CT CHEST, ABDOMEN AND PELVIS WITHOUT CONTRAST TECHNIQUE: Multidetector CT  imaging of the chest, abdomen and pelvis was performed following the standard protocol without IV contrast. COMPARISON:  None. FINDINGS: Motion degraded images. CT CHEST FINDINGS Cardiovascular: Cardiomegaly. No pericardial effusion. Chronic pericardial calcifications. No evidence of thoracic aortic aneurysm. Atherosclerotic calcifications of the aortic arch. Enlargement of the main pulmonary artery, suggesting pulmonary arterial hypertension. Three vessel coronary atherosclerosis. Mediastinum/Nodes: No suspicious mediastinal lymphadenopathy. Visualized thyroid is unremarkable. Lungs/Pleura: Evaluation of the lung parenchyma is constrained by respiratory motion. Within that constraint, there are no suspicious pulmonary nodules. Small right and trace left pleural effusions. Mild right basilar atelectasis.  No focal consolidation. No pneumothorax. Musculoskeletal: No acute fracture is seen. Specifically, the sternum, clavicles, and scapulae are intact. Ribs are poorly evaluated due to motion degradation. Old left rib fracture deformities without definite acute fracture. Dedicated thoracic spine evaluation will be reported separately. CT ABDOMEN PELVIS FINDINGS Hepatobiliary: Unenhanced liver is grossly unremarkable. No perihepatic fluid/hemorrhage. Suspected layering  gallbladder sludge (series 2/image 25), without associated inflammatory changes. No intrahepatic or extrahepatic ductal dilatation. Pancreas: Within normal limits. Spleen: Within normal limits.  No perisplenic fluid/hemorrhage. Adrenals/Urinary Tract: Adrenal glands are within normal limits. Severe bilateral renal cortical atrophy with a dominant 3.6 cm right upper pole renal cyst. No hydronephrosis. Calcified, amorphous right lower quadrant renal transplant with sequela of rejection (series 2/image 54). Left lower quadrant renal transplant without hydronephrosis. Bladder is within normal limits. Stomach/Bowel: Stomach is within normal limits. No evidence  of bowel obstruction. Normal appendix (series 2/image 22). Extensive left colonic diverticulosis, without evidence of diverticulitis. Vascular/Lymphatic: No evidence of abdominal aortic aneurysm. Extensive atherosclerotic calcifications the abdominal aorta and branch vessels. No suspicious abdominopelvic lymphadenopathy. Reproductive: Prostate is grossly unremarkable. Other: No abdominopelvic ascites. No hemoperitoneum or free air. Musculoskeletal: No fracture is seen. Specifically, the visualized bony pelvis and proximal femurs are intact. Dedicated lumbar spine evaluation will be reported separately. IMPRESSION: Motion degraded images. No evidence of traumatic injury to the chest, abdomen, or pelvis. Dedicated thoracolumbar spine evaluation will be reported separately. Small right and trace left pleural effusions. Additional chronic ancillary findings as above. Electronically Signed   By: Julian Hy M.D.   On: 05/26/2020 06:41   DG Chest 1 View  Result Date: 05/26/2020 CLINICAL DATA:  Altered mental status EXAM: CHEST  1 VIEW COMPARISON:  09/06/2018 FINDINGS: Moderate cardiomegaly. Hazy opacities in the right lung may be exaggerated by rotation. Right pleural effusion. IMPRESSION: Right pleural effusion and moderate cardiomegaly. Electronically Signed   By: Ulyses Jarred M.D.   On: 05/26/2020 06:48   DG Pelvis 1-2 Views  Result Date: 05/26/2020 CLINICAL DATA:  Fall found down EXAM: PELVIS - 1-2 VIEW COMPARISON:  None. FINDINGS: There is no evidence of pelvic fracture or diastasis. No pelvic bone lesions are seen. IMPRESSION: Negative. Electronically Signed   By: Ulyses Jarred M.D.   On: 05/26/2020 06:49   DG Forearm Right  Result Date: 05/26/2020 CLINICAL DATA:  Right forearm injury after fall. EXAM: RIGHT FOREARM - 2 VIEW COMPARISON:  None. FINDINGS: There is no evidence of fracture or other focal bone lesions. Vascular calcifications are noted. Postsurgical changes are noted in the antecubital  region. IMPRESSION: Negative. Electronically Signed   By: Marijo Conception M.D.   On: 05/26/2020 09:15   CT Head Wo Contrast  Result Date: 05/26/2020 CLINICAL DATA:  Fall EXAM: CT HEAD WITHOUT CONTRAST CT CERVICAL SPINE WITHOUT CONTRAST TECHNIQUE: Multidetector CT imaging of the head and cervical spine was performed following the standard protocol without intravenous contrast. Multiplanar CT image reconstructions of the cervical spine were also generated. COMPARISON:  None. FINDINGS: CT HEAD FINDINGS Brain: There is no mass, hemorrhage or extra-axial collection. The size and configuration of the ventricles and extra-axial CSF spaces are normal. There is hypoattenuation of the periventricular white matter, most commonly indicating chronic ischemic microangiopathy. Vascular: Atherosclerotic calcification of the vertebral and internal carotid arteries at the skull base. No abnormal hyperdensity of the major intracranial arteries or dural venous sinuses. Skull: Small hematoma at the scalp vertex.  No skull fracture. Sinuses/Orbits: No fluid levels or advanced mucosal thickening of the visualized paranasal sinuses. No mastoid or middle ear effusion. The orbits are normal. CT CERVICAL SPINE FINDINGS Alignment: No static subluxation. Facets are aligned. Occipital condyles are normally positioned. Skull base and vertebrae: No acute fracture. Soft tissues and spinal canal: No prevertebral fluid or swelling. No visible canal hematoma. Disc levels: C5-7 degenerative disc disease. Upper chest: No pneumothorax,  pulmonary nodule or pleural effusion. Other: Normal visualized paraspinal cervical soft tissues. IMPRESSION: 1. 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 2. Small scalp vertex hematoma without skull fracture. 3. No acute fracture or static subluxation of the cervical spine. Electronically Signed   By: Ulyses Jarred M.D.   On: 05/26/2020 06:44   CT Chest Wo Contrast  Result Date:  05/26/2020 CLINICAL DATA:  Fall, found down EXAM: CT CHEST, ABDOMEN AND PELVIS WITHOUT CONTRAST TECHNIQUE: Multidetector CT imaging of the chest, abdomen and pelvis was performed following the standard protocol without IV contrast. COMPARISON:  None. FINDINGS: Motion degraded images. CT CHEST FINDINGS Cardiovascular: Cardiomegaly. No pericardial effusion. Chronic pericardial calcifications. No evidence of thoracic aortic aneurysm. Atherosclerotic calcifications of the aortic arch. Enlargement of the main pulmonary artery, suggesting pulmonary arterial hypertension. Three vessel coronary atherosclerosis. Mediastinum/Nodes: No suspicious mediastinal lymphadenopathy. Visualized thyroid is unremarkable. Lungs/Pleura: Evaluation of the lung parenchyma is constrained by respiratory motion. Within that constraint, there are no suspicious pulmonary nodules. Small right and trace left pleural effusions. Mild right basilar atelectasis.  No focal consolidation. No pneumothorax. Musculoskeletal: No acute fracture is seen. Specifically, the sternum, clavicles, and scapulae are intact. Ribs are poorly evaluated due to motion degradation. Old left rib fracture deformities without definite acute fracture. Dedicated thoracic spine evaluation will be reported separately. CT ABDOMEN PELVIS FINDINGS Hepatobiliary: Unenhanced liver is grossly unremarkable. No perihepatic fluid/hemorrhage. Suspected layering gallbladder sludge (series 2/image 25), without associated inflammatory changes. No intrahepatic or extrahepatic ductal dilatation. Pancreas: Within normal limits. Spleen: Within normal limits.  No perisplenic fluid/hemorrhage. Adrenals/Urinary Tract: Adrenal glands are within normal limits. Severe bilateral renal cortical atrophy with a dominant 3.6 cm right upper pole renal cyst. No hydronephrosis. Calcified, amorphous right lower quadrant renal transplant with sequela of rejection (series 2/image 54). Left lower quadrant renal  transplant without hydronephrosis. Bladder is within normal limits. Stomach/Bowel: Stomach is within normal limits. No evidence of bowel obstruction. Normal appendix (series 2/image 22). Extensive left colonic diverticulosis, without evidence of diverticulitis. Vascular/Lymphatic: No evidence of abdominal aortic aneurysm. Extensive atherosclerotic calcifications the abdominal aorta and branch vessels. No suspicious abdominopelvic lymphadenopathy. Reproductive: Prostate is grossly unremarkable. Other: No abdominopelvic ascites. No hemoperitoneum or free air. Musculoskeletal: No fracture is seen. Specifically, the visualized bony pelvis and proximal femurs are intact. Dedicated lumbar spine evaluation will be reported separately. IMPRESSION: Motion degraded images. No evidence of traumatic injury to the chest, abdomen, or pelvis. Dedicated thoracolumbar spine evaluation will be reported separately. Small right and trace left pleural effusions. Additional chronic ancillary findings as above. Electronically Signed   By: Julian Hy M.D.   On: 05/26/2020 06:41   CT Cervical Spine Wo Contrast  Result Date: 05/26/2020 CLINICAL DATA:  Fall EXAM: CT HEAD WITHOUT CONTRAST CT CERVICAL SPINE WITHOUT CONTRAST TECHNIQUE: Multidetector CT imaging of the head and cervical spine was performed following the standard protocol without intravenous contrast. Multiplanar CT image reconstructions of the cervical spine were also generated. COMPARISON:  None. FINDINGS: CT HEAD FINDINGS Brain: There is no mass, hemorrhage or extra-axial collection. The size and configuration of the ventricles and extra-axial CSF spaces are normal. There is hypoattenuation of the periventricular white matter, most commonly indicating chronic ischemic microangiopathy. Vascular: Atherosclerotic calcification of the vertebral and internal carotid arteries at the skull base. No abnormal hyperdensity of the major intracranial arteries or dural venous  sinuses. Skull: Small hematoma at the scalp vertex.  No skull fracture. Sinuses/Orbits: No fluid levels or advanced mucosal thickening of the visualized paranasal  sinuses. No mastoid or middle ear effusion. The orbits are normal. CT CERVICAL SPINE FINDINGS Alignment: No static subluxation. Facets are aligned. Occipital condyles are normally positioned. Skull base and vertebrae: No acute fracture. Soft tissues and spinal canal: No prevertebral fluid or swelling. No visible canal hematoma. Disc levels: C5-7 degenerative disc disease. Upper chest: No pneumothorax, pulmonary nodule or pleural effusion. Other: Normal visualized paraspinal cervical soft tissues. IMPRESSION: 1. 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 2. Small scalp vertex hematoma without skull fracture. 3. No acute fracture or static subluxation of the cervical spine. Electronically Signed   By: Ulyses Jarred M.D.   On: 05/26/2020 06:44   MR BRAIN WO CONTRAST  Result Date: 05/28/2020 CLINICAL DATA:  Delirium EXAM: MRI HEAD WITHOUT CONTRAST TECHNIQUE: Multiplanar, multiecho pulse sequences of the brain and surrounding structures were obtained without intravenous contrast. COMPARISON:  None. FINDINGS: Motion artifact is present. Brain: There is no acute infarction or intracranial hemorrhage. There is no intracranial mass, mass effect, or edema. There is no hydrocephalus or extra-axial fluid collection. Small chronic left occipital infarct. Minimal patchy T2 hyperintensity in the supratentorial white matter is nonspecific but may reflect minor chronic microvascular ischemic changes. Prominence of the ventricles and sulci reflects generalized parenchymal volume loss. Vascular: Major vessel flow voids at the skull base are preserved. Skull and upper cervical spine: Normal marrow signal is preserved. Sinuses/Orbits: Paranasal sinuses are aerated. Orbits are unremarkable. Other: Sella is unremarkable.  Mastoid air cells are  clear. IMPRESSION: Motion degraded study. No evidence of recent infarction, hemorrhage, or mass. Small chronic left occipital infarct. Electronically Signed   By: Macy Mis M.D.   On: 05/28/2020 16:27   CT T-SPINE NO CHARGE  Result Date: 05/26/2020 CLINICAL DATA:  Fall EXAM: CT Thoracic and Lumbar spine without contrast TECHNIQUE: Multiplanar CT images of the thoracic and lumbar spine were reconstructed from contemporary CT of the Chest, Abdomen, and Pelvis CONTRAST:  No additional contrast COMPARISON:  None FINDINGS: CT THORACIC SPINE FINDINGS Alignment: Normal. Vertebrae: No acute fracture or focal pathologic process. Disc levels: No spinal canal stenosis CT LUMBAR SPINE FINDINGS Segmentation: 5 lumbar type vertebrae. Alignment: Normal. Vertebrae: No acute fracture or focal pathologic process. Paraspinal and other soft tissues: Negative. Disc levels: No spinal canal stenosis. IMPRESSION: No acute fracture or static subluxation of the thoracic or lumbar spine. Electronically Signed   By: Ulyses Jarred M.D.   On: 05/26/2020 06:35   CT L-SPINE NO CHARGE  Result Date: 05/26/2020 CLINICAL DATA:  Fall EXAM: CT Thoracic and Lumbar spine without contrast TECHNIQUE: Multiplanar CT images of the thoracic and lumbar spine were reconstructed from contemporary CT of the Chest, Abdomen, and Pelvis CONTRAST:  No additional contrast COMPARISON:  None FINDINGS: CT THORACIC SPINE FINDINGS Alignment: Normal. Vertebrae: No acute fracture or focal pathologic process. Disc levels: No spinal canal stenosis CT LUMBAR SPINE FINDINGS Segmentation: 5 lumbar type vertebrae. Alignment: Normal. Vertebrae: No acute fracture or focal pathologic process. Paraspinal and other soft tissues: Negative. Disc levels: No spinal canal stenosis. IMPRESSION: No acute fracture or static subluxation of the thoracic or lumbar spine. Electronically Signed   By: Ulyses Jarred M.D.   On: 05/26/2020 06:35   DG CHEST PORT 1 VIEW  Result Date:  05/29/2020 CLINICAL DATA:  Central line placement EXAM: PORTABLE CHEST 1 VIEW.  Patient is slightly rotated. COMPARISON:  Chest x-ray 05/26/2020, CT chest 05/26/2020. FINDINGS: Interval placement of a left internal jugular central venous catheter with tip overlying the expected region  of the superior cavoatrial junction. Moderate cardiomegaly the may be exaggerated due to portable AP technique. The heart size and mediastinal contours are unchanged. Aortic arch calcification. Hilar vasculature prominence. Improved aeration of bilateral lungs. No focal consolidation. No pulmonary edema. Persistent bilateral trace pleural effusions. No pneumothorax. No acute osseous abnormality.  Old healed left rib fractures. IMPRESSION: New left internal jugular central venous catheter with tip overlying the expected region of the superior cavoatrial junction. Persistent bilateral trace pleural effusions in a patient with moderate cardiomegaly. Electronically Signed   By: Iven Finn M.D.   On: 05/29/2020 13:01   DG Hand Complete Right  Result Date: 05/26/2020 CLINICAL DATA:  Right hand pain after fall. EXAM: RIGHT HAND - COMPLETE 3+ VIEW COMPARISON:  None. 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 Vascular calcifications are noted. No other bony abnormality is noted. Joint spaces are intact. IMPRESSION: Status post amputation of a portion of second middle phalanx as well as the second distal phalanx. Electronically Signed   By: Marijo Conception M.D.   On: 05/26/2020 09:13   ECHOCARDIOGRAM COMPLETE  Result Date: 05/26/2020    ECHOCARDIOGRAM REPORT   Patient Name:   SOMNANG MAHAN Date of Exam: 05/26/2020 Medical Rec #:  [id number]       Height:       69.0 in Accession #:    [id number]      Weight:       196.0 lb Date of Birth:  1946-01-09      BSA:          2.048 m Patient Age:    29 years        BP:           161/69 mmHg Patient Gender: M               HR:            74 bpm. Exam Location:  Inpatient Procedure: 2D Echo, Cardiac Doppler and Color Doppler Indications:    CHF-Acute Diastolic 852.77 / O24.23  History:        Patient has prior history of Echocardiogram examinations, most                 recent 03/01/2019. CHF, Arrythmias:Atrial Fibrillation; Risk                 Factors:Dyslipidemia, Hypertension, Diabetes and Non-Smoker.  Sonographer:    Vickie Epley RDCS Referring Phys: [id number] [place]  1. Left ventricular ejection fraction, by estimation, is 55 to 60%. The left ventricle has normal function. The left ventricle has no regional wall motion abnormalities. Left ventricular diastolic parameters are indeterminate. Elevated left ventricular end-diastolic pressure.  2. Right ventricular systolic function is normal. The right ventricular size is normal. Tricuspid regurgitation signal is inadequate for assessing PA pressure.  3. The mitral valve is normal in structure. No evidence of mitral valve regurgitation. No evidence of mitral stenosis.  4. The aortic valve is tricuspid. Aortic valve regurgitation is trivial. Mild aortic valve sclerosis is present, with no evidence of aortic valve stenosis.  5. There is mild dilatation of the aortic root measuring 42 mm.  6. The inferior vena cava is dilated in size with <50% respiratory variability, suggesting right atrial pressure of 15 mmHg. FINDINGS  Left Ventricle: Left ventricular ejection fraction, by estimation, is 55 to 60%. The left ventricle has normal function. The left ventricle  has no regional wall motion abnormalities. The left ventricular internal cavity size was normal in size. There is  no left ventricular hypertrophy. Left ventricular diastolic parameters are indeterminate. Elevated left ventricular end-diastolic pressure. Right Ventricle: The right ventricular size is normal. No increase in right ventricular wall thickness. Right ventricular systolic function is normal. Tricuspid  regurgitation signal is inadequate for assessing PA pressure. Left Atrium: Left atrial size was normal in size. Right Atrium: Right atrial size was normal in size. Pericardium: There is no evidence of pericardial effusion. Mitral Valve: The mitral valve is normal in structure. Normal mobility of the mitral valve leaflets. Mild mitral annular calcification. No evidence of mitral valve regurgitation. No evidence of mitral valve stenosis. Tricuspid Valve: The tricuspid valve is normal in structure. Tricuspid valve regurgitation is trivial. No evidence of tricuspid stenosis. Aortic Valve: The aortic valve is tricuspid. Aortic valve regurgitation is trivial. Mild aortic valve sclerosis is present, with no evidence of aortic valve stenosis. Pulmonic Valve: The pulmonic valve was normal in structure. Pulmonic valve regurgitation is not visualized. No evidence of pulmonic stenosis. Aorta: The aortic root is normal in size and structure. There is mild dilatation of the aortic root measuring 42 mm. Venous: The inferior vena cava is dilated in size with less than 50% respiratory variability, suggesting right atrial pressure of 15 mmHg. IAS/Shunts: No atrial level shunt detected by color flow Doppler.  LEFT VENTRICLE PLAX 2D LVIDd:         4.70 cm      Diastology LVIDs:         3.30 cm      LV e' lateral:   11.10 cm/s LV PW:         1.00 cm      LV E/e' lateral: 12.1 LV IVS:        1.00 cm      LV e' medial:    7.73 cm/s LVOT diam:     2.80 cm      LV E/e' medial:  17.3 LV SV:         100 LV SV Index:   49 LVOT Area:     6.16 cm  LV Volumes (MOD) LV vol d, MOD A2C: 103.0 ml LV vol d, MOD A4C: 119.0 ml LV vol s, MOD A2C: 43.4 ml LV vol s, MOD A4C: 56.6 ml LV SV MOD A2C:     59.6 ml LV SV MOD A4C:     119.0 ml LV SV MOD BP:      61.0 ml RIGHT VENTRICLE RV S prime:     10.60 cm/s TAPSE (M-mode): 1.2 cm LEFT ATRIUM             Index       RIGHT ATRIUM           Index LA diam:        3.60 cm 1.76 cm/m  RA Area:     17.90 cm LA  Vol (A2C):   69.0 ml 33.69 ml/m RA Volume:   53.10 ml  25.92 ml/m LA Vol (A4C):   54.5 ml 26.61 ml/m LA Biplane Vol: 67.1 ml 32.76 ml/m  AORTIC VALVE LVOT Vmax:   87.20 cm/s LVOT Vmean:  57.700 cm/s LVOT VTI:    0.162 m  AORTA Ao Root diam: 4.20 cm MITRAL VALVE MV Area (PHT): 3.48 cm     SHUNTS MV Decel Time: 218 msec     Systemic VTI:  0.16 m MV E velocity: 134.00  cm/s  Systemic Diam: 2.80 cm MV A velocity: 41.60 cm/s MV E/A ratio:  3.22 Fransico Him MD Electronically signed by Fransico Him MD Signature Date/Time: 05/26/2020/12:12:24 PM    Final     Lab Data:  CBC: Recent Labs  Lab 05/30/20 1110 05/31/20 0653 06/01/20 0939 06/02/20 0329 06/03/20 0400  WBC 11.6* 14.6* 13.0* 9.8 10.3  HGB 9.1* 8.5* 7.4* 7.3* 7.2*  HCT 30.1* 28.9* 24.7* 24.1* 23.5*  MCV 95.9 96.7 95.4 94.1 93.6  PLT 107* 96* 81* 87* 505*   Basic Metabolic Panel: Recent Labs  Lab 05/28/20 0302 05/28/20 0302 05/29/20 0249 05/29/20 0249 05/29/20 2048 05/29/20 2048 05/30/20 1110 05/31/20 0653 06/01/20 0939 06/02/20 0329 06/03/20 0400  NA 152*   < > 149*   < > 147*   < > 145 141 134* 133* 134*  K 4.6   < > 3.6   < > 4.3   < > 3.9 4.1 4.5 4.9 4.2  CL 119*   < > 120*   < > 116*   < > 111 106 103 102 105  CO2 19*   < > 22   < > 24   < > 24 23 20* 20* 19*  GLUCOSE 313*   < > 172*   < > 235*   < > 151* 205* 221* 286* 146*  BUN 53*   < > 54*   < > 49*   < > 46* 45* 59* 78* 82*  CREATININE 2.03*   < > 2.00*  2.06*   < > 1.95*   < > 1.86* 2.08* 2.65* 2.66* 2.73*  CALCIUM 9.2   < > 9.0   < > 8.9   < > 8.6* 8.4* 8.2* 8.3* 8.1*  MG  --   --   --   --  1.8  --   --   --   --   --   --   PHOS 2.7  --  1.8*  --   --   --  2.2*  --   --   --   --    < > = values in this interval not displayed.   GFR: Estimated Creatinine Clearance: 27.5 mL/min (A) (by C-G formula based on SCr of 2.73 mg/dL (H)). Liver Function Tests: Recent Labs  Lab 05/28/20 0302 05/29/20 0249 05/30/20 1110  ALBUMIN 3.2* 3.0* 2.9*   No results  for input(s): LIPASE, AMYLASE in the last 168 hours. No results for input(s): AMMONIA in the last 168 hours. Coagulation Profile: No results for input(s): INR, PROTIME in the last 168 hours. Cardiac Enzymes: No results for input(s): CKTOTAL, CKMB, CKMBINDEX, TROPONINI in the last 168 hours. BNP (last 3 results) No results for input(s): PROBNP in the last 8760 hours. HbA1C: Recent Labs    06/02/20 0329  HGBA1C 6.9*   CBG: Recent Labs  Lab 06/02/20 1137 06/02/20 1631 06/02/20 2129 06/03/20 0731 06/03/20 1203  GLUCAP 354* 242* 187* 124* 159*   Lipid Profile: No results for input(s): CHOL, HDL, LDLCALC, TRIG, CHOLHDL, LDLDIRECT in the last 72 hours. Thyroid Function Tests: No results for input(s): TSH, T4TOTAL, FREET4, T3FREE, THYROIDAB in the last 72 hours. Anemia Panel: No results for input(s): VITAMINB12, FOLATE, FERRITIN, TIBC, IRON, RETICCTPCT in the last 72 hours. Urine analysis:    Component Value Date/Time   COLORURINE YELLOW 05/26/2020 [place] 05/26/2020 0441   LABSPEC 1.020 05/26/2020 0441   PHURINE 5.5 05/26/2020 0441   GLUCOSEU NEGATIVE 05/26/2020  [place] (A) 05/26/2020 [place] 05/26/2020 0441   KETONESUR 40 (A) 05/26/2020 0441   PROTEINUR 100 (A) 05/26/2020 0441   NITRITE NEGATIVE 05/26/2020 0441   LEUKOCYTESUR NEGATIVE 05/26/2020 0441     Iran Rowe M.D. [facility] 06/03/2020, 1:57 PM   Call night coverage person covering after 7pm

## 2020-06-03 NOTE — Progress Notes (Signed)
Nephrology Follow-Up Consult note   Assessment/Recommendations: [name] is a/an 74 y.o. male with a past medical history significant for atrial fibrillation, DM 2, CHF, CKD status post renal transplant in 2005, HTN, HLD, admitted for altered mental status.  Hospitalization complicated by AKI and acute gout     Nonoliguric AKI: Baseline creatinine 2-2.5.  Creatinine on arrival was 2.7 improved to 2 on 9/9 and even went lower than baseline down to 1.9 on 9/10.  Started to rise since that time and is now 2.7.  Home ARB and diuretics held. Initial aki thought to be 2/2 dehydration. Unclear cause of Creatinine rise at this time. Partially was overhydrated and rise now re-establishing dry weight. Will CTM -Obtain Bladder scan; notify MD if elevated -Obtain UA and tac level -Agree with holding RAAS blockade and diuretics -Avoid nephrotoxic agents -mild hypotension over last 24-48 hours possible ATN -agree with holding HTN meds -consider IV albumin if fluids needed or hypotension worsens  Volume Status: Appears volume overloaded but difficult to say intrasvascular volume. Obtain albumin and consider IV albumin  Hypertension: history of such. Holding medications given low Bps  H/o Kidney transplant: 2005. Tac trough 4.5 on 9/7 within goal. Was on SL tac earlier so will repeat trough to see where we are now. Continue cellcept 500mg  Bid and tac 1mg  capsule BID. Continue oral pred.  AMS: resolved, possible mild underlying dementia  DM2: mgmt per primary  Gout: oral pred and allopurinol per primary  NAGMA: mild likely related to AKI. Bicarb 19. CTM and will consider supplementation tomorrow if needed.  PVD: nonhealing right index finger. Primary team managing.    Recommendations conveyed to primary service.    [facility] 06/03/2020 2:55 PM  ___________________________________________________________  CC: AKI on CKD  Interval History/Subjective:  Reconsulted for patient's creatinine rising over the past few days.  Patient has had extensive volume overload that has improved but creatinine has continued to rise.  Creatinine was 2.7 today.  Patient states he feels okay except he is worried about his finger.  He thinks there is an infection there.  Denies other complaints.   Medications:  Current Facility-Administered Medications  Medication Dose Route Frequency Provider Last Rate Last Admin  . 0.9 %  sodium chloride infusion (Manually program via Guardrails IV Fluids)   Intravenous Once Rai, Ripudeep K, MD      . 0.9 %  sodium chloride infusion   Intravenous PRN Rai, Ripudeep K, MD 10 mL/hr at 05/31/20 0754 250 mL at 05/31/20 0754  . albuterol (PROVENTIL) (2.5 MG/3ML) 0.083% nebulizer solution 2.5 mg  2.5 mg Nebulization Q2H PRN Rai, Ripudeep K, MD      . allopurinol (ZYLOPRIM) tablet 150 mg  150 mg Oral Daily Rai, Ripudeep K, MD   150 mg at 06/03/20 0952  . atorvastatin (LIPITOR) tablet 10 mg  10 mg Oral Daily Rai, Ripudeep K, MD   10 mg at 06/03/20 0951  . Chlorhexidine Gluconate Cloth 2 % PADS 6 each  6 each Topical Q0600 Rai, Vernelle Emerald, MD   6 each at 06/02/20 0543  . feeding supplement (ENSURE ENLIVE) (ENSURE ENLIVE) liquid 237 mL  237 mL Oral BID BM Rai, Ripudeep K, MD   237 mL at 06/03/20 1219  . fentaNYL (SUBLIMAZE) injection 12.5 mcg  12.5 mcg Intravenous Q4H PRN Rai, Ripudeep K, MD   12.5 mcg at 06/01/20 0232  . gabapentin (NEURONTIN) capsule 100 mg  100 mg Oral BID Rai, Vernelle Emerald, MD  100 mg at 06/03/20 0958  . heparin injection 5,000 Units  5,000 Units Subcutaneous Q8H Rai, Ripudeep K, MD   5,000 Units at 06/03/20 1326  . hydrALAZINE (APRESOLINE) injection 10 mg  10 mg Intravenous Q6H PRN Rai, Ripudeep K, MD   10 mg at 05/30/20 0207  . insulin aspart (novoLOG) injection 0-20 Units  0-20 Units Subcutaneous TID WC Rai, Ripudeep K, MD   4 Units at 06/03/20 1218  . insulin aspart (novoLOG) injection 0-5 Units  0-5 Units Subcutaneous  QHS Rai, Ripudeep K, MD   3 Units at 06/01/20 2232  . insulin aspart (novoLOG) injection 6 Units  6 Units Subcutaneous TID WC Rai, Ripudeep K, MD   6 Units at 06/03/20 1219  . insulin glargine (LANTUS) injection 20 Units  20 Units Subcutaneous QHS Rai, Ripudeep K, MD   20 Units at 06/02/20 2148  . MEDLINE mouth rinse  15 mL Mouth Rinse BID Rai, Ripudeep K, MD   15 mL at 06/03/20 0952  . metoprolol tartrate (LOPRESSOR) injection 5 mg  5 mg Intravenous Q6H PRN Rai, Ripudeep K, MD      . mycophenolate (CELLCEPT) capsule 500 mg  500 mg Oral BID Rai, Ripudeep K, MD   500 mg at 06/03/20 0951  . oxyCODONE-acetaminophen (PERCOCET/ROXICET) 5-325 MG per tablet 1 tablet  1 tablet Oral Q4H PRN Rai, Ripudeep K, MD   1 tablet at 06/02/20 1439  . predniSONE (DELTASONE) tablet 40 mg  40 mg Oral QAC breakfast Rai, Ripudeep K, MD   40 mg at 06/03/20 0752  . sodium chloride flush (NS) 0.9 % injection 10-40 mL  10-40 mL Intracatheter Q12H Rai, Ripudeep K, MD   20 mL at 05/31/20 2323  . sodium chloride flush (NS) 0.9 % injection 10-40 mL  10-40 mL Intracatheter PRN Rai, Ripudeep K, MD      . sodium chloride flush (NS) 0.9 % injection 3 mL  3 mL Intravenous Q12H Rai, Ripudeep K, MD   3 mL at 05/31/20 2323  . tacrolimus (PROGRAF) capsule 1 mg  1 mg Oral BID Rai, Ripudeep K, MD   1 mg at 06/03/20 0951      Review of Systems: 10 systems reviewed and negative except per interval history/subjective  Physical Exam: Vitals:   06/03/20 0616 06/03/20 1211  BP:  119/68  Pulse: (!) 53 (!) 54  Resp:  18  Temp:  98.5 F (36.9 C)  SpO2: 100% 99%   Total I/O In: 3576.9 [P.O.:100; I.V.:3476.9] Out: -   Intake/Output Summary (Last 24 hours) at 06/03/2020 1455 Last data filed at 06/03/2020 1400 Gross per 24 hour  Intake 3576.87 ml  Output 500 ml  Net 3076.87 ml   Constitutional: Chronically ill-appearing, lying in bed, no distress ENMT: ears and nose without scars or lesions, MMM CV: normal rate, 2+ pitting edema in  the right upper extremity, 1+ pitting edema in the other 3 extremities Respiratory: Bilateral chest rise, normal work of breathing Gastrointestinal: soft, non-tender, no palpable masses or hernias Skin: Right finger wrapped in gauze, otherwise no visible lesions or rashes Psych: alert, judgement/insight appropriate, appropriate mood and affect   Test Results I personally reviewed new and old clinical labs and radiology tests Lab Results  Component Value Date   NA 134 (L) 06/03/2020   K 4.2 06/03/2020   CL 105 06/03/2020   CO2 19 (L) 06/03/2020   BUN 82 (H) 06/03/2020   CREATININE 2.73 (H) 06/03/2020   CALCIUM 8.1 (L) 06/03/2020  ALBUMIN 2.9 (L) 05/30/2020   PHOS 2.2 (L) 05/30/2020

## 2020-06-03 NOTE — Progress Notes (Signed)
MD has placed order to transfuse 1 unit PRBCs. RN went to discuss and educate Pt regarding transfusion. Pt states "I want my EPO shot that I get and that will fix it" Two RNs attempted to talk and educate Pt and MD made aware Pt is declining transfusion.

## 2020-06-03 NOTE — TOC Progression Note (Signed)
Transition of Care [facility]) - Progression Note    Patient Details  Name: [name] MRN: [id number] Date of Birth: 30-Mar-1946  [facility]) CM/SW Contact  Hafsah Hendler, Juliann Pulse, RN Phone Number: 06/03/2020, 3:51 PM  Clinical Narrative:Spoke to patient/dtr Andria Frames in rm about d/c plans-they decline SNF want home w/HHC-they will confirm which [facility] agency they want.       Expected Discharge Plan: [place] Barriers to Discharge: Continued Medical Work up  Expected Discharge Plan and Services Expected Discharge Plan: [place]   Discharge Planning Services: CM Consult   Living arrangements for the past 2 months: Single Family Home                           HH Arranged: Refused SNF           Social Determinants of Health (SDOH) Interventions    Readmission Risk Interventions No flowsheet data found.

## 2020-06-03 NOTE — Progress Notes (Signed)
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  Patient apparently awake but confused, complaining of right-sided neck and back pain. 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 Fredna Dow on 9/2/202.  Pt admitted for Acute metabolic encephalopathy: Present on admission.  Likely multifactorial secondary to profound dehydration, hypernatremia, AKI and opiate use   OT comments  Pt with very participative with OT this visit.  Daughter Andria Frames) very encouraging!   Follow Up Recommendations  SNF;CIR (family is committed to taking care of pt.  would benefit from CIR consult)    Equipment Recommendations  3 in 1 bedside commode;Tub/shower seat    Recommendations for Other Services      Precautions / Restrictions Precautions Precautions: Fall Precaution Comments: warn pt prior to assist, pain behaviors with any touching but especially R UE       Mobility Bed Mobility Overal bed mobility: Needs Assistance Bed Mobility: Supine to Sit     Supine to sit: Min assist     General bed mobility comments: assisted pt with moving L LE, required bed pad assist to position hips/scooting, assist with L UE to bring trunk upright  Transfers Overall transfer level: Needs assistance Equipment used: Bilateral platform walker (provided chair for L hand to hold, pt not gripping with R hand due to pain) Transfers: Sit to/from Stand Sit to Stand: Total assist;+2 physical assistance;+2 safety/equipment         General transfer comment: pt able to stand this day with 2  person A but not able to take a step    Balance Overall balance assessment: Needs assistance Sitting-balance support: No upper extremity supported;Feet supported Sitting balance-Leahy Scale: Fair                                     ADL either performed or assessed with clinical judgement   ADL Overall ADL's : Needs assistance/impaired Eating/Feeding: Moderate assistance;Sitting   Grooming: Sitting;Moderate assistance   Upper Body Bathing: Moderate assistance;Sitting   Lower Body Bathing: Sitting/lateral leans;Total assistance                         General ADL Comments: Pt sat EOB with OT. Did attempt to stand with CNA and OT.  2 person A. Pt not able to weight bear through BUE or step with feet.  Used STEADY to get pt to chair with CNA and this worked well.  Daughter present for tx session.   Also encouraged pt to use BUE and perform AROM as able- specifically focusing on wrist extension and neutral wrist               Cognition Arousal/Alertness: Awake/alert Behavior During Therapy: WFL for tasks assessed/performed Overall Cognitive Status: Impaired/Different from baseline Area of Impairment: Attention;Safety/judgement;Awareness;Problem solving                       Following Commands: Follows one step commands consistently Safety/Judgement: Decreased awareness of safety;Decreased awareness of deficits  Pertinent Vitals/ Pain       Pain Score: 4  Pain Location: r UE Pain Descriptors / Indicators: Grimacing;Guarding Pain Intervention(s): Limited activity within patient's tolerance     Prior Functioning/Environment              Frequency  Min 2X/week        Progress Toward Goals  OT Goals(current goals can now be found in the care plan section)  Progress towards OT goals: Progressing toward goals     Plan Discharge plan needs to be updated       AM-PAC OT "6 Clicks" Daily Activity      Outcome Measure   Help from another person eating meals?: A Lot Help from another person taking care of personal grooming?: A Lot Help from another person toileting, which includes using toliet, bedpan, or urinal?: Total Help from another person bathing (including washing, rinsing, drying)?: Total Help from another person to put on and taking off regular upper body clothing?: Total Help from another person to put on and taking off regular lower body clothing?: Total 6 Click Score: 8    End of Session Equipment Utilized During Treatment: Gait belt;Other (comment) (steady)  OT Visit Diagnosis: Other abnormalities of gait and mobility (R26.89);History of falling (Z91.81);Muscle weakness (generalized) (M62.81);Pain   Activity Tolerance Patient tolerated treatment well   Patient Left in chair;with call bell/phone within reach;with family/visitor present   Nurse Communication Mobility status;Need for lift equipment        Time: 9417-4081 OT Time Calculation (min): 54 min  Charges: OT General Charges $OT Visit: 1 Visit OT Treatments $Self Care/Home Management : 53-67 mins  Kari Baars, [place] [phone] Office- [phone], Edwena Felty D 06/03/2020, 7:49 PM

## 2020-06-04 DIAGNOSIS — N179 Acute kidney failure, unspecified: Secondary | ICD-10-CM

## 2020-06-04 DIAGNOSIS — N1832 Chronic kidney disease, stage 3b: Secondary | ICD-10-CM

## 2020-06-04 LAB — RENAL FUNCTION PANEL
Albumin: 2.4 g/dL — ABNORMAL LOW (ref 3.5–5.0)
Anion gap: 13 (ref 5–15)
BUN: 88 mg/dL — ABNORMAL HIGH (ref 8–23)
CO2: 19 mmol/L — ABNORMAL LOW (ref 22–32)
Calcium: 8.4 mg/dL — ABNORMAL LOW (ref 8.9–10.3)
Chloride: 108 mmol/L (ref 98–111)
Creatinine, Ser: 2.43 mg/dL — ABNORMAL HIGH (ref 0.61–1.24)
GFR calc Af Amer: 30 mL/min — ABNORMAL LOW (ref >60)
GFR calc non Af Amer: 26 mL/min — ABNORMAL LOW (ref >60)
Glucose, Bld: 121 mg/dL — ABNORMAL HIGH (ref 70–99)
Phosphorus: 3.1 mg/dL (ref 2.5–4.6)
Potassium: 4.3 mmol/L (ref 3.5–5.1)
Sodium: 140 mmol/L (ref 135–145)

## 2020-06-04 LAB — URINALYSIS, ROUTINE W REFLEX MICROSCOPIC
Bilirubin Urine: NEGATIVE
Glucose, UA: NEGATIVE mg/dL
Hgb urine dipstick: NEGATIVE
Ketones, ur: NEGATIVE mg/dL
Leukocytes,Ua: NEGATIVE
Nitrite: NEGATIVE
Protein, ur: NEGATIVE mg/dL
Specific Gravity, Urine: 1.011 (ref 1.005–1.030)
pH: 5 (ref 5.0–8.0)

## 2020-06-04 LAB — GLUCOSE, CAPILLARY
Glucose-Capillary: 91 mg/dL (ref 70–99)
Glucose-Capillary: 125 mg/dL — ABNORMAL HIGH (ref 70–99)
Glucose-Capillary: 244 mg/dL — ABNORMAL HIGH (ref 70–99)
Glucose-Capillary: 249 mg/dL — ABNORMAL HIGH (ref 70–99)

## 2020-06-04 LAB — OCCULT BLOOD X 1 CARD TO LAB, STOOL: Fecal Occult Bld: NEGATIVE

## 2020-06-04 MED ORDER — DARBEPOETIN ALFA 200 MCG/0.4ML IJ SOSY
200.0000 ug | PREFILLED_SYRINGE | Freq: Once | INTRAMUSCULAR | Status: AC
  Administered 2020-06-04: 200 ug via SUBCUTANEOUS
  Filled 2020-06-04: qty 0.4

## 2020-06-04 MED ORDER — SENNOSIDES-DOCUSATE SODIUM 8.6-50 MG PO TABS
1.0000 | ORAL_TABLET | Freq: Two times a day (BID) | ORAL | Status: DC
  Administered 2020-06-04 – 2020-06-05 (×3): 1 via ORAL
  Filled 2020-06-04 (×3): qty 1

## 2020-06-04 MED ORDER — SODIUM BICARBONATE 650 MG PO TABS
1300.0000 mg | ORAL_TABLET | Freq: Two times a day (BID) | ORAL | Status: DC
  Administered 2020-06-04 – 2020-06-05 (×4): 1300 mg via ORAL
  Filled 2020-06-04 (×4): qty 2

## 2020-06-04 MED ORDER — KATE FARMS STANDARD 1.4 PO LIQD
325.0000 mL | Freq: Every day | ORAL | Status: DC
  Administered 2020-06-04 – 2020-06-06 (×3): 325 mL via ORAL
  Filled 2020-06-04 (×3): qty 325

## 2020-06-04 MED ORDER — ADULT MULTIVITAMIN W/MINERALS CH
1.0000 | ORAL_TABLET | Freq: Every day | ORAL | Status: DC
  Administered 2020-06-04 – 2020-06-06 (×3): 1 via ORAL
  Filled 2020-06-04 (×3): qty 1

## 2020-06-04 MED ORDER — PROSOURCE PLUS PO LIQD
30.0000 mL | Freq: Every day | ORAL | Status: DC
  Administered 2020-06-05: 30 mL via ORAL
  Filled 2020-06-04 (×2): qty 30

## 2020-06-04 NOTE — TOC Progression Note (Signed)
Transition of Care [facility]) - Progression Note    Patient Details  Name: [name] MRN: [id number] Date of Birth: 03-15-1946  Transition of Care [facility] CM/SW Contact  Ameah Chanda, Juliann Pulse, RN Phone Number: 06/04/2020, 10:50 AM  Clinical Narrative:MD/DM spoke to patient/dtr in rm about d/c plans-they now agree to CIR if recommended-dtr chandra prefer [facility]-patient was indep PTA per patient,& dtr. Nsg asked to contact PT for re eval.        Expected Discharge Plan: [place] Barriers to Discharge: Continued Medical Work up  Expected Discharge Plan and Services Expected Discharge Plan: [place]   Discharge Planning Services: CM Consult   Living arrangements for the past 2 months: Single Family Home                           HH Arranged: Refused SNF           Social Determinants of Health (SDOH) Interventions    Readmission Risk Interventions No flowsheet data found.

## 2020-06-04 NOTE — Progress Notes (Signed)
Nephrology Follow-Up Consult note   Assessment/Recommendations: [name] is a/an 74 y.o. male with a past medical history significant for atrial fibrillation, DM 2, CHF, CKD status post renal transplant in 2005, HTN, HLD, admitted for altered mental status.  Hospitalization complicated by AKI and acute gout     Nonoliguric AKI: Baseline creatinine 2-2.5.  Initial AKI with creatinine up to 2.7 that improved with hydration and holding of medications.  Creatinine rose again but improved to 2.4 today within his baseline.  We will continue to monitor his trend -No documentation of elevated PVR -Urinalysis bland -Tacrolimus level trending -Agree with holding RAAS blockade and diuretics -Avoid nephrotoxic agents -agree with holding HTN meds -consider IV albumin if fluids needed or hypotension worsens -Continue monitor creatinine trend  Volume Status: Appears volume overloaded but difficult to say intrasvascular volume.  Albumin 2.4.  Could consider IV albumin if fluids are needed.  We will continue to monitor and let him equilibrate on his own.  Encourage ambulation.  Hypertension: history of such. Holding medications given low Bps  H/o Kidney transplant: 2005. Tac trough 4.5 on 9/7 within goal. Was on SL tac earlier so will repeat trough to see where we are now. Continue cellcept 500mg  Bid and tac 1mg  capsule BID. Continue oral pred.  AMS: resolved, possible mild underlying dementia  DM2: mgmt per primary  Gout: oral pred and allopurinol per primary  NAGMA: mild likely related to AKI. Bicarb 19.  Start oral bicarbonate 1300 mg twice daily  PVD: nonhealing right index finger. Primary team managing.   Dispo: Awaiting AIR   Recommendations conveyed to primary service.    [facility] 06/04/2020 1:28 PM  ___________________________________________________________  CC: AKI on CKD  Interval History/Subjective: Patient states he is about the same  today.  Denies significant problems such as nausea or vomiting.  He was ordered Aranesp 200 MCG per outpatient orders.   Medications:  Current Facility-Administered Medications  Medication Dose Route Frequency Provider Last Rate Last Admin  . 0.9 %  sodium chloride infusion (Manually program via Guardrails IV Fluids)   Intravenous Once Rai, Ripudeep K, MD      . 0.9 %  sodium chloride infusion   Intravenous PRN Rai, Ripudeep K, MD 10 mL/hr at 05/31/20 0754 250 mL at 05/31/20 0754  . albuterol (PROVENTIL) (2.5 MG/3ML) 0.083% nebulizer solution 2.5 mg  2.5 mg Nebulization Q2H PRN Rai, Ripudeep K, MD      . allopurinol (ZYLOPRIM) tablet 150 mg  150 mg Oral Daily Rai, Ripudeep K, MD   150 mg at 06/04/20 0904  . atorvastatin (LIPITOR) tablet 10 mg  10 mg Oral Daily Rai, Ripudeep K, MD   10 mg at 06/04/20 0903  . Chlorhexidine Gluconate Cloth 2 % PADS 6 each  6 each Topical Q0600 Rai, Vernelle Emerald, MD   6 each at 06/04/20 0559  . Darbepoetin Alfa (ARANESP) injection 200 mcg  200 mcg Subcutaneous Once [name], MD      . feeding supplement (ENSURE ENLIVE) (ENSURE ENLIVE) liquid 237 mL  237 mL Oral BID BM Rai, Ripudeep K, MD   237 mL at 06/03/20 1219  . fentaNYL (SUBLIMAZE) injection 12.5 mcg  12.5 mcg Intravenous Q4H PRN Rai, Ripudeep K, MD   12.5 mcg at 06/01/20 0232  . gabapentin (NEURONTIN) capsule 100 mg  100 mg Oral BID Rai, Ripudeep K, MD   100 mg at 06/04/20 0903  . heparin injection 5,000 Units  5,000 Units Subcutaneous Q8H  Rai, Vernelle Emerald, MD   5,000 Units at 06/04/20 0600  . hydrALAZINE (APRESOLINE) injection 10 mg  10 mg Intravenous Q6H PRN Rai, Ripudeep K, MD   10 mg at 05/30/20 0207  . insulin aspart (novoLOG) injection 0-20 Units  0-20 Units Subcutaneous TID WC Rai, Ripudeep K, MD   7 Units at 06/03/20 1835  . insulin aspart (novoLOG) injection 0-5 Units  0-5 Units Subcutaneous QHS Rai, Ripudeep K, MD   2 Units at 06/03/20 2139  . insulin aspart (novoLOG) injection 6 Units  6 Units  Subcutaneous TID WC Rai, Ripudeep K, MD   6 Units at 06/04/20 0900  . insulin glargine (LANTUS) injection 20 Units  20 Units Subcutaneous QHS Rai, Ripudeep K, MD   20 Units at 06/03/20 2142  . MEDLINE mouth rinse  15 mL Mouth Rinse BID Rai, Ripudeep K, MD   15 mL at 06/03/20 2143  . metoprolol tartrate (LOPRESSOR) injection 5 mg  5 mg Intravenous Q6H PRN Rai, Ripudeep K, MD      . mycophenolate (CELLCEPT) capsule 500 mg  500 mg Oral BID Rai, Ripudeep K, MD   500 mg at 06/04/20 0902  . predniSONE (DELTASONE) tablet 40 mg  40 mg Oral QAC breakfast Rai, Ripudeep K, MD   40 mg at 06/04/20 0903  . senna-docusate (Senokot-S) tablet 1 tablet  1 tablet Oral BID Florencia Reasons, MD   1 tablet at 06/04/20 0903  . sodium bicarbonate tablet 1,300 mg  1,300 mg Oral BID Reesa Chew, MD   1,300 mg at 06/04/20 1134  . sodium chloride flush (NS) 0.9 % injection 10-40 mL  10-40 mL Intracatheter Q12H Rai, Ripudeep K, MD   20 mL at 05/31/20 2323  . sodium chloride flush (NS) 0.9 % injection 10-40 mL  10-40 mL Intracatheter PRN Rai, Ripudeep K, MD   10 mL at 06/04/20 0458  . sodium chloride flush (NS) 0.9 % injection 3 mL  3 mL Intravenous Q12H Rai, Ripudeep K, MD   3 mL at 05/31/20 2323  . tacrolimus (PROGRAF) capsule 1 mg  1 mg Oral BID Rai, Ripudeep K, MD   1 mg at 06/04/20 5366      Review of Systems: 10 systems reviewed and negative except per interval history/subjective  Physical Exam: Vitals:   06/04/20 0507 06/04/20 1315  BP: 129/63 138/73  Pulse: (!) 46 (!) 51  Resp:  16  Temp: 97.8 F (36.6 C) 98.1 F (36.7 C)  SpO2: 96% 97%   Total I/O In: 480 [P.O.:480] Out: 550 [Urine:550]  Intake/Output Summary (Last 24 hours) at 06/04/2020 1328 Last data filed at 06/04/2020 1320 Gross per 24 hour  Intake 1370.93 ml  Output 1650 ml  Net -279.07 ml   Constitutional: Chronically ill-appearing, lying in bed, no distress ENMT: ears and nose without scars or lesions, MMM CV: normal rate, 2+ pitting edema  in the right upper extremity, trace pitting edema in the bilateral lower extremities Respiratory: Bilateral chest rise, normal work of breathing Gastrointestinal: soft, non-tender, no palpable masses or hernias Skin: Right finger wrapped in gauze, otherwise no visible lesions or rashes Psych: alert, judgement/insight appropriate, appropriate mood and affect   Test Results I personally reviewed new and old clinical labs and radiology tests Lab Results  Component Value Date   NA 140 06/04/2020   K 4.3 06/04/2020   CL 108 06/04/2020   CO2 19 (L) 06/04/2020   BUN 88 (H) 06/04/2020   CREATININE 2.43 (H) 06/04/2020  CALCIUM 8.4 (L) 06/04/2020   ALBUMIN 2.4 (L) 06/04/2020   PHOS 3.1 06/04/2020

## 2020-06-04 NOTE — Progress Notes (Signed)
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  Patient apparently awake but confused, complaining of right-sided neck and back pain. 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 Fredna Dow on 9/2/202.  Pt admitted for Acute metabolic encephalopathy: Present on admission.  Likely multifactorial secondary to profound dehydration, hypernatremia, AKI and opiate use    PT Comments    Assisted pt OOB to amb required + 2 asisst.  General bed mobility comments: increased time with inability to functionally use R UE due to R index finfer pain.  General transfer comment: attempted sit to stand without any AD, pt was unable to clear hips off bed.  Pt required + 2 side by side assist to power up again unable to functionally use R UE and noted profound weakness L LE KNEE BUCKLE.  General Gait Details: Used B platform EVA walker for increased support as pt was unable to support his weight.  Profound weakness B LE esp L LE.  Heavy lean on EVA walker and great difficulty advancing L LE. MMT L LE hip flex 2/4 and knee ext 1/4 Hx multiple falls Pt will need aggressive Rehab prior to returning home   Follow Up Recommendations  CIR     Equipment Recommendations       Recommendations for Other Services       Precautions / Restrictions Precautions Precautions: Fall Precaution Comments: R hand pain and profound L LE weakness Restrictions Weight Bearing Restrictions: No    Mobility  Bed Mobility Overal bed mobility: Needs Assistance Bed Mobility: Supine to Sit     Supine to  sit: Mod assist     General bed mobility comments: increased time with inability to functionally use R UE due to R index finfer pain  Transfers Overall transfer level: Needs assistance Equipment used: Bilateral platform walker (EVA walker) Transfers: Sit to/from Stand Sit to Stand: Max assist;+2 safety/equipment;+2 physical assistance         General transfer comment: attempted sit to stand without any AD, pt was unable to clear hips off bed.  Pt required + 2 side by side assist to power up again unable to functionally use R UE and noted profound weakness L LE 9KNEE BUCKLE)  Ambulation/Gait Ambulation/Gait assistance: +2 physical assistance;Max assist Gait Distance (Feet): 14 Feet Assistive device: Bilateral platform walker (eva WALKER) Gait Pattern/deviations: Step-to pattern;Decreased stance time - left Gait velocity: decreased   General Gait Details: Used B platform EVA walker for increased support as pt was unable to support his weight.  Profound weakness B LE esp L LE.  Heavy lean on EVA walker and great difficulty advancing L LE.   Stairs             Wheelchair Mobility    Modified Rankin (Stroke Patients Only)       Balance                                            Cognition Arousal/Alertness: Awake/alert Behavior During Therapy: WFL for tasks  assessed/performed Overall Cognitive Status: Within Functional Limits for tasks assessed                                 General Comments: AxO x 3 with slight delay in responce      Exercises      General Comments        Pertinent Vitals/Pain Pain Assessment: Faces Faces Pain Scale: Hurts even more Pain Location: R hand Pain Descriptors / Indicators: Grimacing;Guarding Pain Intervention(s): Monitored during session;Premedicated before session;Repositioned    Home Living                      Prior Function            PT Goals (current goals can now be  found in the care plan section) Progress towards PT goals: Progressing toward goals    Frequency    Min 2X/week      PT Plan Current plan remains appropriate    Co-evaluation              AM-PAC PT "6 Clicks" Mobility   Outcome Measure  Help needed turning from your back to your side while in a flat bed without using bedrails?: A Lot Help needed moving from lying on your back to sitting on the side of a flat bed without using bedrails?: A Lot Help needed moving to and from a bed to a chair (including a wheelchair)?: A Lot Help needed standing up from a chair using your arms (e.g., wheelchair or bedside chair)?: A Lot Help needed to walk in hospital room?: A Lot Help needed climbing 3-5 steps with a railing? : Total 6 Click Score: 11    End of Session Equipment Utilized During Treatment: Gait belt Activity Tolerance: Patient limited by fatigue Patient left: in chair;with bed alarm set;with call bell/phone within reach;with family/visitor present Nurse Communication: Mobility status PT Visit Diagnosis: Difficulty in walking, not elsewhere classified (R26.2);Muscle weakness (generalized) (M62.81)     Time: 8527-7824 PT Time Calculation (min) (ACUTE ONLY): 28 min  Charges:  $Gait Training: 8-22 mins $Therapeutic Activity: 8-22 mins                     Rica Koyanagi  PTA Acute  Rehabilitation Services Pager      [phone] Office      [phone]

## 2020-06-04 NOTE — Progress Notes (Signed)
PROGRESS NOTE    [name]  BUL:[id number] DOB: Oct 24, 1945 DOA: 05/26/2020 PCP: [name], MD    Chief Complaint  Patient presents with  . 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  This was completed on May 22, 2020.  Patient was discharged home.  Tissue culture revealed rare gram.positive cocci, pansensitive Proteus.  Readmitted with altered mental status, difficulty to arouse.  Found to be acidotic, hypotensive with positive beta hydroxybutyrate.  Due to concern for sepsis patient given IV fluids and started on antibiotics.  Subjective:  Less confused, but remain very weak, reports previously independent and driving to his appointment Daughter at bedside  Assessment & Plan:   Principal Problem:   Altered mental status Active Problems:   Kidney transplant status, cadaveric   Essential hypertension   Acute metabolic encephalopathy   Diabetes mellitus type 2, insulin dependent (HCC)   Sepsis ([place])   Hypernatremia   Increased anion gap metabolic acidosis   Encounter for central line placement  Acute metabolic encephalopathy, present on admission - Likely multifactorial secondary to profound dehydration, hypernatremia, AKI and opiate use.  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  - UA + ketones likely due to poor oral intake. - Urine drug screen positive for opiates which he was prescribed on  last discharge.  -Tacrolimus level  2.2 on 9/6, 4.5 on 9/7 -Sodium level normalized today at 140, patient is alert and oriented, per daughter patient's mental status is improving but not at his baseline yet, daughter reports he could not remember what has happened and still slow in thinking  Acute metabolic acidosis, POA - Secondary to renal failure vs sepsis vs mild DKA - Labs showed beta hydroxybutyrate elevation at 4.35 on presentation raising possibility of mild DKA.   -Blood cultures negative so far, urine cultures less than 10k CFU.  MRSA PCR negative. -Patient was placed on IV antibiotics.Vanc/maxipime/flagyl on admission, then ancef then augmentin   to cover Proteus from prior wound cultures - he has completed antibiotics. per Dr. Fredna Dow, index finger lookedgood during dressing change.  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 Home diuretics (Lasix 80 mg twice daily) was placed on hold. Seen by nephrology and was started on bicarb supplemented D5W. -currently on cellcept, Prograf and prednisone , there is a concern of missing meds at home due to altered mental status -plan to complete40 mg of prednisone x 5 days for acute gout attack, then start prednisone 5 mg daily his maintenance dose for history of renal transplant -Creatinine continues to rise, bicarb starting to trend down again, nephrology reconsulted, discussed with Dr. Osborne Casco.  Lasix, losartan currently on hold,   Rhabdomyolysis, elevated CK -Patient with CK level~1500. Likely secondary to falls, being on the floor.  Patient received IV fluid hydration, currently off hydration, encourage oral intake -Statin was placed on hold  Normocytic anemia with h/o anemia of chronic disease -Hemoglobin also slowly trending down, likely has some hemodilution due to  IV fluids, hemoglobin 7.2 -anemia panel : b12/folate unremarkable, iron panel not consistent with iron  deficiency, retic count inappropriately low, FOBT negative, -he refused pRBC transfusion, he agreed to epo injection ( reports getting epo injection monthly, he missed dose in September due to hand surgery), nephrology notified and will order epo  Hypertensive urgency: -Patient presented with BP 224/102 in the setting of confusion and agitation.  -BP currently stable, continue to hold Lasix, beta-blocker, nitro patch, Norvasc, resume gradually    Elevated troponin - In the setting of renal failure and possible demand ischemia from uncontrolled blood pressure. -EKG with nonspecific ST-T changes.  -Echocardiogram shows preserved EF  Chronic diastolic CHF: - Repeat echo shows preserved EF. 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 Fredna Dow. - dressing changed on 9/9by Dr Fredna Dow . - Dr. Fredna Dow and recommended "ensure coban not placed too tight. If bandaids work better for maintaining wound coverage they may be used instead. Keep wound clean and dry. Encouraged gentle motion of other digits and hand to help with swelling. Okay to follow up after discharge. Contact if any concerns".  Acute gout -Much improved, moving all 4 extremities -Continue oral prednisone, allopurinol -Resume  gabapentin -Currently on prednisone 40 mg daily for 5 days for acute gout attack  Superficial skin tears on extremities, pressure injury, left lower arm posterior skin tears, present on admission - Wound care recommendations appreciated "Reason for Consult: Skin tears sustained during fall to upper and lower extremities, upper>lower. Wound type: Traumatic Pressure Injury POA: N/A Measurement: Bedside RN to measure with first dressing change Wound bed:red, moist on upper extremities, lower extremities are red, dry Drainage (amount, consistency, odor) scant serous Periwound: ecchymotic, dry Dressing procedure/placement/frequency: I will implement features of the house skin tear protocol with an antimicrobial nonadherent dressing used as a wound contact layer (xeroform). Topper dressing will be silicone foam for atraumatic removal."    DVT prophylaxis: heparin injection 5,000 Units Start: 05/26/20 1400 SCDs Start: 05/26/20 1026   Code Status:full Family Communication: daughter at bedside Disposition:   Status is: Inpatient  Dispo: The patient is from: home              Anticipated d/c is to: CIR              Anticipated d/c date is: needs nephrology clearance , needs to remove picc line prior to discharge                Consultants:   Critical care  Nephrology  Hand surgery Dr Fredna Dow  CIR  Procedures:  Left IJ central  line placement by critical care at bedside on 9/9  Antimicrobials:   Vanc/maxipime/flagyl on admission, then ancef then augmentin     Objective: Vitals:   06/03/20 2051 06/03/20 2134 06/04/20 0507 06/04/20 1315  BP: (!) 116/93  129/63 138/73  Pulse: (!) 58 (!) 50 (!) 46 [place] Kitchen)  51  Resp: 20   16  Temp: 98.2 F (36.8 C)  97.8 F (36.6 C) 98.1 F (36.7 C)  TempSrc: Oral  Oral Oral  SpO2: 90% 100% 96% 97%  Weight:      Height:        Intake/Output Summary (Last 24 hours) at 06/04/2020 1523 Last data filed at 06/04/2020 1320 Gross per 24 hour    Intake 871 ml  Output 1050 ml  Net -179 ml   Filed Weights   05/31/20 0500 06/01/20 0600 06/02/20 0500  Weight: 85.9 kg 91.6 kg 92.9 kg    Examination:  General exam: anxious, slight tremor Respiratory system: Clear to auscultation. Respiratory effort normal. Cardiovascular system: S1 & S2 heard, RRR. No JVD, no murmur, No pedal edema. Gastrointestinal system: Abdomen is nondistended, soft and nontender. No organomegaly or masses felt. Normal bowel sounds heard. Central nervous system: Alert and oriented. No focal neurological deficits. Extremities: generalized weakness,  Skin: No rashes, lesions or ulcers Psychiatry: Judgement and insight appear normal. Mood & affect appropriate.     Data Reviewed: I have personally reviewed following labs and imaging studies  CBC: Recent Labs  Lab 05/30/20 1110 05/31/20 0653 06/01/20 0939 06/02/20 0329 06/03/20 0400  WBC 11.6* 14.6* 13.0* 9.8 10.3  HGB 9.1* 8.5* 7.4* 7.3* 7.2*  HCT 30.1* 28.9* 24.7* 24.1* 23.5*  MCV 95.9 96.7 95.4 94.1 93.6  PLT 107* 96* 81* 87* 112*    Basic Metabolic Panel: Recent Labs  Lab 05/29/20 0249 05/29/20 0249 05/29/20 2048 05/29/20 2048 05/30/20 1110 05/30/20 1110 05/31/20 0653 06/01/20 0939 06/02/20 0329 06/03/20 0400 06/04/20 0911  NA 149*   < > 147*   < > 145   < > 141 134* 133* 134* 140  K 3.6   < > 4.3   < > 3.9   < > 4.1 4.5 4.9 4.2 4.3  CL 120*   < > 116*   < > 111   < > 106 103 102 105 108  CO2 22   < > 24   < > 24   < > 23 20* 20* 19* 19*  GLUCOSE 172*   < > 235*   < > 151*   < > 205* 221* 286* 146* 121*  BUN 54*   < > 49*   < > 46*   < > 45* 59* 78* 82* 88*  CREATININE 2.00*  2.06*   < > 1.95*   < > 1.86*   < > 2.08* 2.65* 2.66* 2.73* 2.43*  CALCIUM 9.0   < > 8.9   < > 8.6*   < > 8.4* 8.2* 8.3* 8.1* 8.4*  MG  --   --  1.8  --   --   --   --   --   --   --   --   PHOS 1.8*  --   --   --  2.2*  --   --   --   --   --  3.1   < > = values in this interval not displayed.     GFR: Estimated Creatinine Clearance: 30.9 mL/min (A) (by C-G formula based on SCr of 2.43 mg/dL (H)).  Liver Function Tests: Recent Labs  Lab 05/29/20 0249 05/30/20 1110 06/03/20 1658 06/04/20 0911  ALBUMIN 3.0* 2.9* 2.5* 2.4*    CBG: Recent Labs  Lab 06/03/20 1203 06/03/20 1649 06/03/20 2137 06/04/20 0729 06/04/20 1133  GLUCAP 159* 220* 247* 91 125*  Recent Results (from the past 240 hour(s))  Blood culture (routine x 2)     Status: None   Collection Time: 05/26/20  4:42 AM   Specimen: BLOOD  Result Value Ref Range Status   Specimen Description   Final    BLOOD RIGHT HAND Performed at [facility] [address], [place], [place] [address]    Special Requests   Final    BOTTLES DRAWN AEROBIC AND ANAEROBIC Blood Culture results may not be optimal due to an inadequate volume of blood received in culture bottles Performed at [facility] [address], [place], [place] [address]    Culture   Final    NO GROWTH 5 DAYS Performed at [facility], [place] [address] [place], [place] [address]    Report Status 05/31/2020 FINAL  Final  Urine Culture     Status: Abnormal   Collection Time: 05/26/20  4:42 AM   Specimen: Urine, Random  Result Value Ref Range Status   Specimen Description   Final    URINE, RANDOM Performed at [facility] [address] [place], [place] [address]    Special Requests   Final    NONE Performed at [facility], [place] [address], [place], [place] [address]    Culture (A)  Final    <10,000 COLONIES/mL INSIGNIFICANT GROWTH Performed at [facility] [address] [place], [place] [address]    Report Status 05/27/2020 FINAL  Final  SARS Coronavirus 2 by RT PCR (hospital order, performed in [facility] hospital lab) Nasopharyngeal Nasopharyngeal Swab     Status: None   Collection Time: 05/26/20  4:43 AM   Specimen: Nasopharyngeal Swab  Result  Value Ref Range Status   SARS Coronavirus 2 NEGATIVE NEGATIVE Final    Comment: (NOTE) SARS-CoV-2 target nucleic acids are NOT DETECTED.  The SARS-CoV-2 RNA is generally detectable in upper and lower respiratory specimens during the acute phase of infection. The lowest concentration of SARS-CoV-2 viral copies this assay can detect is 250 copies / mL. A negative result does not preclude SARS-CoV-2 infection and should not be used as the sole basis for treatment or other patient management decisions.  A negative result may occur with improper specimen collection / handling, submission of specimen other than nasopharyngeal swab, presence of viral mutation(s) within the areas targeted by this assay, and inadequate number of viral copies (<250 copies / mL). A negative result must be combined with clinical observations, patient history, and epidemiological information.  Fact Sheet for Patients:   StrictlyIdeas.no  Fact Sheet for Healthcare Providers: BankingDealers.co.za  This test is not yet approved or  cleared by the [place] FDA and has been authorized for detection and/or diagnosis of SARS-CoV-2 by FDA under an Emergency Use Authorization (EUA).  This EUA will remain in effect (meaning this test can be used) for the duration of the COVID-19 declaration under Section 564(b)(1) of the Act, 21 U.S.C. section 360bbb-3(b)(1), unless the authorization is terminated or revoked sooner.  Performed at [facility], [place] [address], [place],  [address]   MRSA PCR Screening     Status: None   Collection Time: 05/27/20 10:05 AM   Specimen: Nasopharyngeal  Result Value Ref Range Status   MRSA by PCR NEGATIVE NEGATIVE Final    Comment:        The GeneXpert MRSA Assay (FDA approved for NASAL specimens only), is one component of a comprehensive  MRSA colonization surveillance program. It is not intended to diagnose  MRSA infection nor to guide or monitor treatment for MRSA infections. Performed at [facility], [place] [address] [place], [place] [address]          Radiology Studies: No results found.      Scheduled Meds: . (feeding supplement) PROSource Plus  30 mL Oral Daily  . sodium chloride   Intravenous Once  . allopurinol  150 mg Oral Daily  . atorvastatin  10 mg Oral Daily  . Chlorhexidine Gluconate Cloth  6 each Topical Q0600  . feeding supplement (ENSURE ENLIVE)  237 mL Oral BID BM  . feeding supplement (KATE [employer]ARMS STANDARD 1.4)  325 mL Oral Daily  . gabapentin  100 mg Oral BID  . heparin  5,000 Units Subcutaneous Q8H  . insulin aspart  0-20 Units Subcutaneous TID WC  . insulin aspart  0-5 Units Subcutaneous QHS  . insulin aspart  6 Units Subcutaneous TID WC  . insulin glargine  20 Units Subcutaneous QHS  . mouth rinse  15 mL Mouth Rinse BID  . multivitamin with minerals  1 tablet Oral Daily  . mycophenolate  500 mg Oral BID  . predniSONE  40 mg Oral QAC breakfast  . senna-docusate  1 tablet Oral BID  . sodium bicarbonate  1,300 mg Oral BID  . sodium chloride flush  10-40 mL Intracatheter Q12H  . sodium chloride flush  3 mL Intravenous Q12H  . tacrolimus  1 mg Oral BID   Continuous Infusions: . sodium chloride 250 mL (05/31/20 0754)     LOS: 9 days   Time spent: 56mins Greater than 50% of this time was spent in counseling, explanation of diagnosis, planning of further management, and coordination of care.  I have personally reviewed and interpreted on  06/04/2020 daily labs, tele strips, imagings as discussed above under date review session and assessment and plans.  I reviewed all nursing notes, pharmacy notes, consultant notes,  vitals, pertinent old records  I have discussed plan of care as described above with RN , patient and family on 06/04/2020  Voice Recognition /Dragon dictation system was used to create this note, attempts have been  made to correct errors. Please contact the author with questions and/or clarifications.   Florencia Reasons, MD PhD FACP [facility]  Available via Epic secure chat 7am-7pm for nonurgent issues Please page for urgent issues To page the attending provider between 7A-7P or the covering provider during after hours 7P-7A, please log into the web site [email or web address] and access using universal [place] password for that web site. If you do not have the password, please call the hospital operator.    06/04/2020, 3:23 PM

## 2020-06-04 NOTE — TOC Progression Note (Signed)
Transition of Care [facility]) - Progression Note    Patient Details  Name: [name] MRN: [id number] Date of Birth: 02/17/1946  Transition of [facility]) CM/SW Contact  Oskar Cretella, Juliann Pulse, RN Phone Number: 06/04/2020, 3:40 PM  Clinical Narrative:PT note, H&P,OT eval faxed to [facility] for CIR await response.       Expected Discharge Plan: IP Rehab Facility Barriers to Discharge: Continued Medical Work up  Expected Discharge Plan and Services Expected Discharge Plan: [place]   Discharge Planning Services: CM Consult   Living arrangements for the past 2 months: Single Family Home                           HH Arranged: Refused SNF           Social Determinants of Health (SDOH) Interventions    Readmission Risk Interventions No flowsheet data found.

## 2020-06-04 NOTE — Progress Notes (Signed)
Inpatient Rehabilitation Admissions Coordinator  Inpatient rehab consult received. Noted family preference for other [facility] center. I will defer assessment at this time due to family preference.  Danne Baxter, RN, MSN Rehab Admissions Coordinator [phone] 06/04/2020 11:38 AM

## 2020-06-04 NOTE — Progress Notes (Signed)
Initial Nutrition Assessment  DOCUMENTATION CODES:   Not applicable  INTERVENTION:  - will order [employer] 1.4 po once/day, each supplement provides 455 kcal and 20 grams protein. - will order 30 ml Prosource Plus once/day, each supplement provides 100 kcal and 15 grams protein. - will order 1 tablet multivitamin with minerals/day.   NUTRITION DIAGNOSIS:   Increased nutrient needs related to acute illness as evidenced by estimated needs.  GOAL:   Patient will meet greater than or equal to 90% of their needs  MONITOR:   PO intake, Supplement acceptance, Labs, Weight trends  REASON FOR ASSESSMENT:   Malnutrition Screening Tool  ASSESSMENT:   74 year old male with medical history of afib, DM, HFpEF (EF 60 to 65% June 2020), stage 4 CKD previously on dialysis and s/p renal transplant in 2005 now on immunosuppressants, HTN, and hyperlipidemia. He presented to the ED via EMS after being found down at home. He recently had R index finger amputation on 9/2/21for a non-healing, necrotic wound.  Unable to see patient at this time. Per flow sheet documentation, patient recently consumed the following: 9/11- 75% of breakfast and 50% of lunch (total of 912 kcal, 33 grams protein) 9/12- 25% of breakfast (189 kcal, 9 grams protein) 9/13- 50% of breakfast and 20% of lunch (total of 443 kcal, 18 grams protein) 9/14- 50% of lunch (378 kcal, 15 grams protein) 9/15- 75% of lunch (297 kcal, 17 grams protein)  Weight has been fluctuating often since admission on 9/6. He was last weighed on 9/13 which was the highest weight since admission.   Per notes: - pending admission to inpatient rehab - Nephrology following d/t hx of renal transplant in 2005   Labs reviewed; CBGs: 91 and 125 mg/dl, BUN: 88 mg/dl, creatinine: 2.43 mg/dl, Ca: 8.4 mg/dl, GFR: 26 ml/min. Medications reviewed; sliding scale novolog, 6 units novolog TID, 20 units lantus/day, 40 mg deltasone/day, 1 tablet senokot BID, 1300 mg  sodium bicarb BID.     NUTRITION - FOCUSED PHYSICAL EXAM:  unable to complete at this time.   Diet Order:   Diet Order            Diet Carb Modified Fluid consistency: Thin; Room service appropriate? Yes  Diet effective now                 EDUCATION NEEDS:   No education needs have been identified at this time  Skin:  Skin Assessment: Reviewed RN Assessment (multiple skin tears; incision on R finger (9/2))  Last BM:  9/15 (type 5)  Height:   Ht Readings from Last 1 Encounters:  05/26/20 5\' 9"  (1.753 m)    Weight:   Wt Readings from Last 1 Encounters:  06/02/20 92.9 kg     Estimated Nutritional Needs:  Kcal:  1765-1955 kcal Protein:  70-80 grams Fluid:  >/= 1.8 L/day     Jarome Matin, MS, RD, LDN, CNSC Inpatient Clinical Dietitian RD pager # available in [facility]  After hours/weekend pager # available in [facility]

## 2020-06-05 LAB — CBC WITH DIFFERENTIAL/PLATELET
Abs Immature Granulocytes: 0.57 10*3/uL — ABNORMAL HIGH (ref 0.00–0.07)
Basophils Absolute: 0 10*3/uL (ref 0.0–0.1)
Basophils Relative: 0 %
Eosinophils Absolute: 0 10*3/uL (ref 0.0–0.5)
Eosinophils Relative: 0 %
HCT: 27.1 % — ABNORMAL LOW (ref 39.0–52.0)
Hemoglobin: 8 g/dL — ABNORMAL LOW (ref 13.0–17.0)
Immature Granulocytes: 5 %
Lymphocytes Relative: 6 %
Lymphs Abs: 0.7 10*3/uL (ref 0.7–4.0)
MCH: 28 pg (ref 26.0–34.0)
MCHC: 29.5 g/dL — ABNORMAL LOW (ref 30.0–36.0)
MCV: 94.8 fL (ref 80.0–100.0)
Monocytes Absolute: 0.7 10*3/uL (ref 0.1–1.0)
Monocytes Relative: 6 %
Neutro Abs: 9.9 10*3/uL — ABNORMAL HIGH (ref 1.7–7.7)
Neutrophils Relative %: 83 %
Platelets: 193 10*3/uL (ref 150–400)
RBC: 2.86 MIL/uL — ABNORMAL LOW (ref 4.22–5.81)
RDW: 13.9 % (ref 11.5–15.5)
WBC: 11.8 10*3/uL — ABNORMAL HIGH (ref 4.0–10.5)
nRBC: 0.2 % (ref 0.0–0.2)

## 2020-06-05 LAB — RENAL FUNCTION PANEL
Albumin: 2.7 g/dL — ABNORMAL LOW (ref 3.5–5.0)
Anion gap: 10 (ref 5–15)
BUN: 85 mg/dL — ABNORMAL HIGH (ref 8–23)
CO2: 20 mmol/L — ABNORMAL LOW (ref 22–32)
Calcium: 8.5 mg/dL — ABNORMAL LOW (ref 8.9–10.3)
Chloride: 109 mmol/L (ref 98–111)
Creatinine, Ser: 2.02 mg/dL — ABNORMAL HIGH (ref 0.61–1.24)
GFR calc Af Amer: 37 mL/min — ABNORMAL LOW (ref >60)
GFR calc non Af Amer: 32 mL/min — ABNORMAL LOW (ref >60)
Glucose, Bld: 185 mg/dL — ABNORMAL HIGH (ref 70–99)
Phosphorus: 3 mg/dL (ref 2.5–4.6)
Potassium: 4.6 mmol/L (ref 3.5–5.1)
Sodium: 139 mmol/L (ref 135–145)

## 2020-06-05 LAB — GLUCOSE, CAPILLARY
Glucose-Capillary: 128 mg/dL — ABNORMAL HIGH (ref 70–99)
Glucose-Capillary: 184 mg/dL — ABNORMAL HIGH (ref 70–99)
Glucose-Capillary: 127 mg/dL — ABNORMAL HIGH (ref 70–99)
Glucose-Capillary: 322 mg/dL — ABNORMAL HIGH (ref 70–99)

## 2020-06-05 MED ORDER — PREDNISONE 20 MG PO TABS
20.0000 mg | ORAL_TABLET | Freq: Every day | ORAL | Status: DC
  Administered 2020-06-06: 20 mg via ORAL
  Filled 2020-06-05: qty 1

## 2020-06-05 MED ORDER — SENNOSIDES-DOCUSATE SODIUM 8.6-50 MG PO TABS: 1.0000 | ORAL_TABLET | Freq: Every day | ORAL | Status: DC

## 2020-06-05 MED ORDER — CARVEDILOL 3.125 MG PO TABS
3.1250 mg | ORAL_TABLET | Freq: Two times a day (BID) | ORAL | Status: DC
  Administered 2020-06-05: 3.125 mg via ORAL
  Filled 2020-06-05 (×2): qty 1

## 2020-06-05 NOTE — Progress Notes (Signed)
Inpatient Rehabilitation Admissions Coordinator  I will not have a Cir bed this week for admission of this patient. TOC, Kathy and acute team are aware.  Danne Baxter, RN, MSN Rehab Admissions Coordinator [phone] 06/05/2020 12:16 PM

## 2020-06-05 NOTE — Progress Notes (Signed)
Nephrology Follow-Up Consult note   Assessment/Recommendations: [name] is a/an 74 y.o. male with a past medical history significant for atrial fibrillation, DM 2, CHF, CKD status post renal transplant in 2005, HTN, HLD, admitted for altered mental status.  Hospitalization complicated by AKI and acute gout     Nonoliguric AKI: Baseline creatinine 2-2.5.  Initial AKI with creatinine up to 2.7 that improved with hydration and holding of medications.  Creatinine rose again for unclear cause (bladder scan and urinalysis reassuring) but now back down to 2.  Likely will sign off if creatinine remains within goal and tacrolimus trough returns -Follow-up tacrolimus level -Agree with holding RAAS blockade and diuretics; could reinstitute slowly in the outpatient setting -Avoid nephrotoxic agents -agree with holding HTN meds -consider IV albumin if fluids needed or hypotension worsens -Continue monitor creatinine trend  Volume Status: Appears volume overloaded but difficult to say intrasvascular volume.  Albumin has been low less than 3.  Could consider IV albumin if fluids are needed.  We will continue to monitor and let him equilibrate on his own.  Encourage ambulation.  Hypertension: history of such. Holding medications given low Bps  H/o Kidney transplant: 2005. Tac trough 4.5 on 9/7 within goal. Was on SL tac earlier so will repeat trough to see where we are now. Continue cellcept 500mg  Bid and tac 1mg  capsule BID. Continue oral pred.  DM2: mgmt per primary  Gout: oral pred and allopurinol per primary  NAGMA: mild likely related to AKI. Bicarb 20 today.  Continue oral bicarbonate 1300 mg twice daily  PVD: nonhealing right index finger. Primary team managing.   Dispo: Awaiting rehab   Recommendations conveyed to primary service.    [facility] 06/05/2020 1:03 PM  ___________________________________________________________  CC: AKI on  CKD  Interval History/Subjective: Patient denies significant complaints today.  Urine output has been adequate.  No issues.   Medications:  Current Facility-Administered Medications  Medication Dose Route Frequency Provider Last Rate Last Admin  . (feeding supplement) PROSource Plus liquid 30 mL  30 mL Oral Daily Florencia Reasons, MD      . 0.9 %  sodium chloride infusion (Manually program via Guardrails IV Fluids)   Intravenous Once Rai, Ripudeep K, MD      . 0.9 %  sodium chloride infusion   Intravenous PRN Rai, Ripudeep K, MD 10 mL/hr at 05/31/20 0754 250 mL at 05/31/20 0754  . albuterol (PROVENTIL) (2.5 MG/3ML) 0.083% nebulizer solution 2.5 mg  2.5 mg Nebulization Q2H PRN Rai, Ripudeep K, MD      . allopurinol (ZYLOPRIM) tablet 150 mg  150 mg Oral Daily Rai, Ripudeep K, MD   150 mg at 06/05/20 0847  . atorvastatin (LIPITOR) tablet 10 mg  10 mg Oral Daily Rai, Ripudeep K, MD   10 mg at 06/05/20 0849  . carvedilol (COREG) tablet 3.125 mg  3.125 mg Oral BID WC Florencia Reasons, MD      . Chlorhexidine Gluconate Cloth 2 % PADS 6 each  6 each Topical Q0600 Rai, Vernelle Emerald, MD   6 each at 06/05/20 [phone]  . feeding supplement (ENSURE ENLIVE) (ENSURE ENLIVE) liquid 237 mL  237 mL Oral BID BM Rai, Ripudeep K, MD   237 mL at 06/05/20 0847  . feeding supplement (KATE FARMS STANDARD 1.4) liquid 325 mL  325 mL Oral Daily Florencia Reasons, MD   325 mL at 06/05/20 0849  . fentaNYL (SUBLIMAZE) injection 12.5 mcg  12.5 mcg Intravenous Q4H PRN  Rai, Vernelle Emerald, MD   12.5 mcg at 06/01/20 0232  . gabapentin (NEURONTIN) capsule 100 mg  100 mg Oral BID Rai, Ripudeep K, MD   100 mg at 06/05/20 0849  . heparin injection 5,000 Units  5,000 Units Subcutaneous Q8H Rai, Ripudeep K, MD   5,000 Units at 06/05/20 0559  . hydrALAZINE (APRESOLINE) injection 10 mg  10 mg Intravenous Q6H PRN Rai, Ripudeep K, MD   10 mg at 05/30/20 0207  . insulin aspart (novoLOG) injection 0-20 Units  0-20 Units Subcutaneous TID WC Rai, Ripudeep K, MD   4 Units at  06/05/20 1232  . insulin aspart (novoLOG) injection 0-5 Units  0-5 Units Subcutaneous QHS Rai, Ripudeep K, MD   2 Units at 06/04/20 2212  . insulin aspart (novoLOG) injection 6 Units  6 Units Subcutaneous TID WC Rai, Ripudeep K, MD   6 Units at 06/05/20 1233  . insulin glargine (LANTUS) injection 20 Units  20 Units Subcutaneous QHS Rai, Ripudeep K, MD   20 Units at 06/04/20 2214  . MEDLINE mouth rinse  15 mL Mouth Rinse BID Rai, Ripudeep K, MD   15 mL at 06/05/20 0849  . metoprolol tartrate (LOPRESSOR) injection 5 mg  5 mg Intravenous Q6H PRN Rai, Ripudeep K, MD      . multivitamin with minerals tablet 1 tablet  1 tablet Oral Daily Florencia Reasons, MD   1 tablet at 06/05/20 0848  . mycophenolate (CELLCEPT) capsule 500 mg  500 mg Oral BID Rai, Ripudeep K, MD   500 mg at 06/05/20 0849  . senna-docusate (Senokot-S) tablet 1 tablet  1 tablet Oral BID Florencia Reasons, MD   1 tablet at 06/05/20 0848  . sodium bicarbonate tablet 1,300 mg  1,300 mg Oral BID Reesa Chew, MD   1,300 mg at 06/05/20 0848  . sodium chloride flush (NS) 0.9 % injection 10-40 mL  10-40 mL Intracatheter Q12H Rai, Ripudeep K, MD   20 mL at 05/31/20 2323  . sodium chloride flush (NS) 0.9 % injection 10-40 mL  10-40 mL Intracatheter PRN Rai, Ripudeep K, MD   10 mL at 06/04/20 0458  . sodium chloride flush (NS) 0.9 % injection 3 mL  3 mL Intravenous Q12H Rai, Ripudeep K, MD   3 mL at 05/31/20 2323  . tacrolimus (PROGRAF) capsule 1 mg  1 mg Oral BID Rai, Ripudeep K, MD   1 mg at 06/05/20 0850      Review of Systems: 10 systems reviewed and negative except per interval history/subjective  Physical Exam: Vitals:   06/04/20 2105 06/05/20 0523  BP: (!) 141/74 (!) 171/73  Pulse: (!) 55 (!) 57  Resp: 18 18  Temp: 98.6 F (37 C) 98.5 F (36.9 C)  SpO2: 100% 100%   No intake/output data recorded.  Intake/Output Summary (Last 24 hours) at 06/05/2020 1303 Last data filed at 06/05/2020 1610 Gross per 24 hour  Intake 480 ml  Output 2250 ml   Net -1770 ml   Constitutional: Chronically ill-appearing, lying in bed, no distress ENMT: ears and nose without scars or lesions, MMM CV: normal rate, 2+ pitting edema in the right upper extremity, trace pitting edema in the bilateral lower extremities Respiratory: Bilateral chest rise, normal work of breathing Gastrointestinal: soft, non-tender, no palpable masses or hernias Skin: Right finger wrapped in gauze, otherwise no visible lesions or rashes Psych: alert, judgement/insight appropriate, appropriate mood and affect   Test Results I personally reviewed new and old clinical labs and  radiology tests Lab Results  Component Value Date   NA 139 06/05/2020   K 4.6 06/05/2020   CL 109 06/05/2020   CO2 20 (L) 06/05/2020   BUN 85 (H) 06/05/2020   CREATININE 2.02 (H) 06/05/2020   CALCIUM 8.5 (L) 06/05/2020   ALBUMIN 2.7 (L) 06/05/2020   PHOS 3.0 06/05/2020

## 2020-06-05 NOTE — Progress Notes (Addendum)
PROGRESS NOTE    [name]  FIE:[id number] DOB: [birth year] DOA: 05/26/2020 PCP: [name], MD    Chief Complaint  Patient presents with  . 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  This was completed on May 22, 2020.  Patient was discharged home.  Tissue culture revealed rare gram.positive cocci, pansensitive Proteus.  Readmitted with altered mental status, difficulty to arouse.  Found to be acidotic, hypotensive with positive beta hydroxybutyrate.  Due to concern for sepsis patient given IV fluids and started on antibiotics.  Subjective:  reports confusion has almost resolved, he feels slightly improvement of his strength, overall he is still very weak, not abe his baseline He  reports previously independent and driving to his appointment Daughter at bedside  Assessment & Plan:   Principal Problem:   Altered mental status Active Problems:   Kidney transplant status, cadaveric   Essential hypertension   Acute metabolic encephalopathy   Diabetes mellitus type 2, insulin dependent (HCC)   Sepsis ([place])   Hypernatremia   Increased anion gap metabolic acidosis   Encounter for central line placement  Acute metabolic encephalopathy, present on admission - Likely multifactorial secondary to profound dehydration, hypernatremia, AKI and opiate use.  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  - UA +  ketones likely due to poor oral intake. - Urine drug screen positive for opiates which he was prescribed on last discharge.  -Tacrolimus level  2.2 on 9/6, 4.5 on 9/7, repeat level from 9/14 result pending -Sodium level normalized since 9/15, patient is alert and oriented, reports confusion has resolved   Acute metabolic acidosis, POA - Secondary to renal failure vs sepsis vs mild DKA - Labs showed beta hydroxybutyrate elevation at 4.35 on presentation raising possibility of mild DKA.   -Blood cultures negative so far, urine cultures less than 10k CFU.  MRSA PCR negative. -Patient was placed on IV antibiotics.Vanc/maxipime/flagyl on admission, then ancef then augmentin   to cover Proteus from prior wound cultures - he has completed antibiotics. per Dr. Fredna Dow, index finger lookedgood during dressing change.  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 Home diuretics (Lasix 80 mg twice daily) was placed on hold. Seen by nephrology and was started on bicarb supplemented D5W. -currently on cellcept, Prograf and prednisone , there is a concern of missing meds at home due to altered mental status -plan to complete40 mg of prednisone x 5 days for acute gout attack, then start prednisone 5 mg daily his maintenance dose for history of renal transplant - Lasix, losartan currently on hold, appreciate nephrology input  Rhabdomyolysis, elevated CK -Patient with CK level~1500. Likely secondary to falls, being on the floor.  Patient received IV fluid hydration, currently off hydration, encourage oral intake -Statin was placed on hold  Normocytic anemia with h/o anemia of chronic disease -Hemoglobin also slowly trending down, likely has some hemodilution due to IV fluids, hemoglobin 7.2 -anemia panel : b12/folate unremarkable, iron panel not consistent  with iron deficiency, retic count inappropriately low, FOBT negative, -he refused pRBC  transfusion, he agreed to epo injection ( reports getting epo injection monthly ) -he received asanesp x1 on 9/15  Hypertensive urgency: -Patient presented with BP 224/102 in the setting of confusion and agitation.  -BP start to going up, resume coreg at a lower dose,  continue to hold Lasix,  nitro patch, Norvasc,    Elevated troponin - In the setting of renal failure and possible demand ischemia from uncontrolled blood pressure. -EKG with nonspecific ST-T changes.  -Echocardiogram shows preserved EF  Chronic diastolic CHF: - Repeat echo shows preserved EF. -Currently Lasix placed on hold due to creatinine trending up, follow volume status closely  Paroxysmal atrial fibrillation/aflutter - EKG on admission showed sinus rhythm  -Currently rate controlled, not on chronic anticoagulation due to high risk of falls  He does not appear to have symptoms, blood pressure stable, clinically he is improving, will DC telemetry  Diabetes mellitus type 2, uncontrolled with hyperglycemia likely due to steroids -Continue to hold oral hypoglycemics -Hyperglycemia likely due to steroids, CBGs better with increased insulin regimen, continue Lantus 20 units, NovoLog 6 units 3 times daily AC, sliding scale insulin  Hyperlipidemia Resume Lipitor  Peripheral vascular disease, nonhealing right index finger wound with necrosis:  - S/p amputation for right index finger necrotic changes recently by hand surgery, Dr. Fredna Dow. - dressing changed on 9/9by Dr Fredna Dow . - Dr. Fredna Dow and recommended "ensure coban not placed too tight. If bandaids work better for maintaining wound coverage they may be used instead. Keep wound clean and dry. Encouraged gentle motion of other digits and hand to help with swelling. Okay to follow up after discharge. Contact if any concerns".  Acute gout -Much improved, moving all 4 extremities -Continue allopurinol -Resume gabapentin -finished  prednisone 40 mg  daily for 5 days for acute gout attack, last dose on 9/15, taper prednisone back to baseline prednisone 5mg  daily  Superficial skin tears on extremities, pressure injury, left lower arm posterior skin tears, present on admission - Wound care recommendations appreciated "Reason for Consult: Skin tears sustained during fall to upper and lower extremities, upper>lower. Wound type: Traumatic Pressure Injury POA: N/A Measurement: Bedside RN to measure with first dressing change Wound bed:red, moist on upper extremities, lower extremities are red, dry Drainage (amount, consistency, odor) scant serous Periwound: ecchymotic, dry Dressing procedure/placement/frequency: I will implement features of the house skin tear protocol with an antimicrobial nonadherent dressing used as a wound contact layer (xeroform). Topper dressing will be silicone foam for atraumatic removal."    DVT prophylaxis: heparin injection 5,000 Units Start: 05/26/20 1400 SCDs Start: 05/26/20 1026   Code Status:full Family Communication: daughter at bedside Disposition:   Status is: Inpatient  Dispo: The patient is from: home              Anticipated d/c is to: CIR vs home with home health , patient does not want snf              Anticipated d/c date is: needs nephrology clearance , needs to remove picc line prior to discharge                Consultants:   Critical care  Nephrology  Hand surgery Dr Fredna Dow  CIR  Procedures:  Left IJ central  line placement by critical care at bedside on 9/9  Antimicrobials:   Vanc/maxipime/flagyl on admission, then ancef then augmentin     Objective: Vitals:  06/04/20 2105 06/05/20 0523 06/05/20 1312 06/05/20 1322  BP: (!) 141/74 (!) 171/73  (!) 150/61  Pulse: (!) 55 (!) 57  (!) 57  Resp: 18 18    Temp: 98.6 F (37 C) 98.5 F (36.9 C)  98.1 F (36.7 C)  TempSrc: Oral Oral  Oral  SpO2: 100% 100% 99% 100%  Weight:      Height:        Intake/Output Summary  (Last 24 hours) at 06/05/2020 1437 Last data filed at 06/05/2020 1327 Gross per 24 hour  Intake 240 ml  Output 2700 ml  Net -2460 ml   Filed Weights   05/31/20 0500 06/01/20 0600 06/02/20 0500  Weight: 85.9 kg 91.6 kg 92.9 kg    Examination:  General exam: calm and pleasant today, slight tremor but improved Respiratory system: Clear to auscultation. Respiratory effort normal. Cardiovascular system: S1 & S2 heard, RRR. No JVD, no murmur, No pedal edema. Gastrointestinal system: Abdomen is nondistended, soft and nontender. No organomegaly or masses felt. Normal bowel sounds heard. Central nervous system: Alert and oriented. No focal neurological deficits. Extremities: generalized weakness, overall improving, left forearm/hand edema is improving, left index finger dressing in place Skin: No rashes, lesions or ulcers Psychiatry: Judgement and insight appear normal. Mood & affect appropriate.     Data Reviewed: I have personally reviewed following labs and imaging studies  CBC: Recent Labs  Lab 05/31/20 0653 06/01/20 0939 06/02/20 0329 06/03/20 0400 06/05/20 0338  WBC 14.6* 13.0* 9.8 10.3 11.8*  NEUTROABS  --   --   --   --  9.9*  HGB 8.5* 7.4* 7.3* 7.2* 8.0*  HCT 28.9* 24.7* 24.1* 23.5* 27.1*  MCV 96.7 95.4 94.1 93.6 94.8  PLT 96* 81* 87* 112* 161    Basic Metabolic Panel: Recent Labs  Lab 05/29/20 2048 05/29/20 2048 05/30/20 1110 05/31/20 0653 06/01/20 0939 06/02/20 0329 06/03/20 0400 06/04/20 0911 06/05/20 0338  NA 147*   < > 145   < > 134* 133* 134* 140 139  K 4.3   < > 3.9   < > 4.5 4.9 4.2 4.3 4.6  CL 116*   < > 111   < > 103 102 105 108 109  CO2 24   < > 24   < > 20* 20* 19* 19* 20*  GLUCOSE 235*   < > 151*   < > 221* 286* 146* 121* 185*  BUN 49*   < > 46*   < > 59* 78* 82* 88* 85*  CREATININE 1.95*   < > 1.86*   < > 2.65* 2.66* 2.73* 2.43* 2.02*  CALCIUM 8.9   < > 8.6*   < > 8.2* 8.3* 8.1* 8.4* 8.5*  MG 1.8  --   --   --   --   --   --   --   --   PHOS   --   --  2.2*  --   --   --   --  3.1 3.0   < > = values in this interval not displayed.    GFR: Estimated Creatinine Clearance: 37.2 mL/min (A) (by C-G formula based on SCr of 2.02 mg/dL (H)).  Liver Function Tests: Recent Labs  Lab 05/30/20 1110 06/03/20 1658 06/04/20 0911 06/05/20 0338  ALBUMIN 2.9* 2.5* 2.4* 2.7*    CBG: Recent Labs  Lab 06/04/20 1133 06/04/20 1615 06/04/20 2103 06/05/20 0750 06/05/20 1127  GLUCAP 125* 244* 249* 128* 184*     Recent  Results (from the past 240 hour(s))  MRSA PCR Screening     Status: None   Collection Time: 05/27/20 10:05 AM   Specimen: Nasopharyngeal  Result Value Ref Range Status   MRSA by PCR NEGATIVE NEGATIVE Final    Comment:        The GeneXpert MRSA Assay (FDA approved for NASAL specimens only), is one component of a comprehensive MRSA colonization surveillance program. It is not intended to diagnose MRSA infection nor to guide or monitor treatment for MRSA infections. Performed at [facility], [place] [address] [place],  [address]          Radiology Studies: No results found.      Scheduled Meds: . (feeding supplement) PROSource Plus  30 mL Oral Daily  . sodium chloride   Intravenous Once  . allopurinol  150 mg Oral Daily  . atorvastatin  10 mg Oral Daily  . carvedilol  3.125 mg Oral BID WC  . Chlorhexidine Gluconate Cloth  6 each Topical Q0600  . feeding supplement (ENSURE ENLIVE)  237 mL Oral BID BM  . feeding supplement (KATE FARMS STANDARD 1.4)  325 mL Oral Daily  . gabapentin  100 mg Oral BID  . heparin  5,000 Units Subcutaneous Q8H  . insulin aspart  0-20 Units Subcutaneous TID WC  . insulin aspart  0-5 Units Subcutaneous QHS  . insulin aspart  6 Units Subcutaneous TID WC  . insulin glargine  20 Units Subcutaneous QHS  . mouth rinse  15 mL Mouth Rinse BID  . multivitamin with minerals  1 tablet Oral Daily  . mycophenolate  500 mg Oral BID  . [START ON 06/06/2020]  predniSONE  20 mg Oral Q breakfast  . senna-docusate  1 tablet Oral BID  . sodium bicarbonate  1,300 mg Oral BID  . sodium chloride flush  10-40 mL Intracatheter Q12H  . sodium chloride flush  3 mL Intravenous Q12H  . tacrolimus  1 mg Oral BID   Continuous Infusions: . sodium chloride 250 mL (05/31/20 0754)     LOS: 10 days   Time spent: 84mins Greater than 50% of this time was spent in counseling, explanation of diagnosis, planning of further management, and coordination of care.  I have personally reviewed and interpreted on  06/05/2020 daily labs, tele strips, imagings as discussed above under date review session and assessment and plans.  I reviewed all nursing notes, pharmacy notes, consultant notes,  vitals, pertinent old records  I have discussed plan of care as described above with RN , patient and family on 06/05/2020  Voice Recognition /Dragon dictation system was used to create this note, attempts have been made to correct errors. Please contact the author with questions and/or clarifications.   Florencia Reasons, MD PhD FACP [facility]  Available via Epic secure chat 7am-7pm for nonurgent issues Please page for urgent issues To page the attending provider between 7A-7P or the covering provider during after hours 7P-7A, please log into the web site [email or web address] and access using universal [place] password for that web site. If you do not have the password, please call the hospital operator.    06/05/2020, 2:37 PM

## 2020-06-05 NOTE — Progress Notes (Signed)
VAST RN changed CL dressing as it was coming loose. Educated unit RN to pass along to her colleagues not to place any tape across or over the [place] dressing as it interferes with clear window characteristics and ability to breathe.

## 2020-06-05 NOTE — Care Management Important Message (Signed)
Important Message  Patient Details IM Letter given to the Patient Name: Dontravious Camille MRN: [id number] Date of Birth: 1946/06/08   Medicare Important Message Given:  Yes     Kerin Salen 06/05/2020, 11:27 AM

## 2020-06-05 NOTE — TOC Progression Note (Addendum)
Transition of Care [facility]) - Progression Note    Patient Details  Name: [name] MRN: [id number] Date of Birth: [birth year]  Transition of Care [facility]) CM/SW Contact  [name], Michael Pulse, RN Phone Number: 06/05/2020, 11:25 AM  Clinical Narrative: [name] rep [name] reviewed case-they do not have beds available. Dtr [name] made aware-she agree to [facility] IR rehab to eval-[name] notified-await recc.    12p-[facility] CIR rep [name]-no beds available;dtr agreed to fax to [facility]-rep [name] aware of referral-await if able to accept.  5p-Spoke to dtr [name] about outcome of [facility] CIR rep [name]-they cannot accep-not a candidate for CIR-they recc SNF.Dtr expressed [name], she will discuss w/family how they will plan. Informed about [facility] can provide all disciplines,family can supplement with private duty.  [facility] CIR rep [name] contacted again-they do not have beds available,they recc home or SNF. Left vm w/dtr [name]-will f/u tomorrow.  Expected Discharge Plan: IP Rehab Facility Barriers to Discharge: Continued Medical Work up  Expected Discharge Plan and Services Expected Discharge Plan: [place]   Discharge Planning Services: CM Consult   Living arrangements for the past 2 months: Single Family Home                           HH Arranged: Refused SNF           Social Determinants of Health (SDOH) Interventions    Readmission Risk Interventions No flowsheet data found.

## 2020-06-05 NOTE — Progress Notes (Signed)
Patient voided 600cc's of urine.  PVR showing no urine left in bladder.

## 2020-06-05 NOTE — TOC Progression Note (Signed)
Transition of Care [facility]) - Progression Note    Patient Details  Name: [name] MRN: [id number] Date of Birth: 1946-07-25  Transition of Care [facility]) CM/SW Contact  Ricco Dershem, Juliann Pulse, RN Phone Number: 06/05/2020, 11:08 AM  Clinical Narrative:  [facility] rep [facility]-await call back if able to accept for CIR.     Expected Discharge Plan: IP Rehab Facility Barriers to Discharge: Continued Medical Work up  Expected Discharge Plan and Services Expected Discharge Plan: [place]   Discharge Planning Services: CM Consult   Living arrangements for the past 2 months: Single Family Home                           HH Arranged: Refused SNF           Social Determinants of Health (SDOH) Interventions    Readmission Risk Interventions No flowsheet data found.

## 2020-06-05 NOTE — Progress Notes (Signed)
VAST consulted to change CL dsg. Upon arrival at bedside, patient care being performed. VAST RN will return later today.

## 2020-06-06 LAB — HEPATIC FUNCTION PANEL
ALT: 34 U/L (ref 0–44)
AST: 51 U/L — ABNORMAL HIGH (ref 15–41)
Albumin: 2.5 g/dL — ABNORMAL LOW (ref 3.5–5.0)
Alkaline Phosphatase: 74 U/L (ref 38–126)
Bilirubin, Direct: 0.2 mg/dL (ref 0.0–0.2)
Indirect Bilirubin: 0.4 mg/dL (ref 0.3–0.9)
Total Bilirubin: 0.6 mg/dL (ref 0.3–1.2)
Total Protein: 5.1 g/dL — ABNORMAL LOW (ref 6.5–8.1)

## 2020-06-06 LAB — CBC WITH DIFFERENTIAL/PLATELET
Abs Immature Granulocytes: 0.73 10*3/uL — ABNORMAL HIGH (ref 0.00–0.07)
Basophils Absolute: 0 10*3/uL (ref 0.0–0.1)
Basophils Relative: 0 %
Eosinophils Absolute: 0 10*3/uL (ref 0.0–0.5)
Eosinophils Relative: 0 %
HCT: 24.9 % — ABNORMAL LOW (ref 39.0–52.0)
Hemoglobin: 7.4 g/dL — ABNORMAL LOW (ref 13.0–17.0)
Immature Granulocytes: 6 %
Lymphocytes Relative: 4 %
Lymphs Abs: 0.5 10*3/uL — ABNORMAL LOW (ref 0.7–4.0)
MCH: 28.5 pg (ref 26.0–34.0)
MCHC: 29.7 g/dL — ABNORMAL LOW (ref 30.0–36.0)
MCV: 95.8 fL (ref 80.0–100.0)
Monocytes Absolute: 0.5 10*3/uL (ref 0.1–1.0)
Monocytes Relative: 4 %
Neutro Abs: 9.7 10*3/uL — ABNORMAL HIGH (ref 1.7–7.7)
Neutrophils Relative %: 86 %
Platelets: 193 10*3/uL (ref 150–400)
RBC: 2.6 MIL/uL — ABNORMAL LOW (ref 4.22–5.81)
RDW: 14.2 % (ref 11.5–15.5)
WBC: 11.4 10*3/uL — ABNORMAL HIGH (ref 4.0–10.5)
nRBC: 0.2 % (ref 0.0–0.2)

## 2020-06-06 LAB — RENAL FUNCTION PANEL
Albumin: 2.4 g/dL — ABNORMAL LOW (ref 3.5–5.0)
Anion gap: 9 (ref 5–15)
BUN: 63 mg/dL — ABNORMAL HIGH (ref 8–23)
CO2: 22 mmol/L (ref 22–32)
Calcium: 8.8 mg/dL — ABNORMAL LOW (ref 8.9–10.3)
Chloride: 110 mmol/L (ref 98–111)
Creatinine, Ser: 1.66 mg/dL — ABNORMAL HIGH (ref 0.61–1.24)
GFR calc Af Amer: 47 mL/min — ABNORMAL LOW (ref >60)
GFR calc non Af Amer: 41 mL/min — ABNORMAL LOW (ref >60)
Glucose, Bld: 240 mg/dL — ABNORMAL HIGH (ref 70–99)
Phosphorus: 2.4 mg/dL — ABNORMAL LOW (ref 2.5–4.6)
Potassium: 4.9 mmol/L (ref 3.5–5.1)
Sodium: 141 mmol/L (ref 135–145)

## 2020-06-06 LAB — GLUCOSE, CAPILLARY
Glucose-Capillary: 249 mg/dL — ABNORMAL HIGH (ref 70–99)
Glucose-Capillary: 297 mg/dL — ABNORMAL HIGH (ref 70–99)

## 2020-06-06 LAB — CK: Total CK: 166 U/L (ref 49–397)

## 2020-06-06 MED ORDER — HEPARIN SOD (PORK) LOCK FLUSH 100 UNIT/ML IV SOLN
500.0000 [IU] | INTRAVENOUS | Status: DC | PRN
  Filled 2020-06-06: qty 5

## 2020-06-06 MED ORDER — CARVEDILOL 6.25 MG PO TABS
6.2500 mg | ORAL_TABLET | Freq: Two times a day (BID) | ORAL | Status: DC
  Administered 2020-06-06: 6.25 mg via ORAL

## 2020-06-06 MED ORDER — SODIUM BICARBONATE 650 MG PO TABS: 650.0000 mg | ORAL_TABLET | Freq: Two times a day (BID) | ORAL | Status: DC

## 2020-06-06 MED ORDER — PREDNISONE 5 MG PO TABS
10.0000 mg | ORAL_TABLET | Freq: Every day | ORAL | Status: DC
  Filled 2020-06-06: qty 2

## 2020-06-06 MED ORDER — FUROSEMIDE 40 MG PO TABS
40.0000 mg | ORAL_TABLET | Freq: Every day | ORAL | 0 refills | Status: DC
Start: 2020-06-06 — End: 2020-09-10

## 2020-06-06 MED ORDER — ALLOPURINOL 100 MG PO TABS: 100.0000 mg | ORAL_TABLET | Freq: Every day | ORAL | 0 refills | Status: AC

## 2020-06-06 MED ORDER — SENNOSIDES-DOCUSATE SODIUM 8.6-50 MG PO TABS: 1.0000 | ORAL_TABLET | Freq: Every day | ORAL | 0 refills | Status: AC

## 2020-06-06 MED ORDER — FUROSEMIDE 40 MG PO TABS
40.0000 mg | ORAL_TABLET | Freq: Every day | ORAL | Status: DC
  Administered 2020-06-06: 40 mg via ORAL
  Filled 2020-06-06: qty 1

## 2020-06-06 MED ORDER — HYDRALAZINE HCL 25 MG PO TABS
25.0000 mg | ORAL_TABLET | Freq: Three times a day (TID) | ORAL | Status: DC
  Administered 2020-06-06: 25 mg via ORAL
  Filled 2020-06-06: qty 1

## 2020-06-06 NOTE — TOC Progression Note (Addendum)
Transition of Care [facility]) - Progression Note    Patient Details  Name: Jacquis Paxton MRN: [id number] Date of Birth: May 14, 1946  Transition of Care [facility]) CM/SW Contact  Dameer Speiser, Juliann Pulse, RN Phone Number: 06/06/2020, 1:16 PM  Clinical Narrative:[employer] rep Lakresha/Betsy-they will check on w/c since there is shortage,also informed them that patient may or may not want hospital bed-they will talk to dtr Andria Frames to confirm dme. Pay need PTAR @ d/c.      -Received call from [place] rep Betsy-states patient does not want hospital bed/w/c or rw. Patient only wants 3n1-they will deliver to rm prior d/c.   Expected Discharge Plan: [place] Barriers to Discharge: Continued Medical Work up  Expected Discharge Plan and Services Expected Discharge Plan: [place]   Discharge Planning Services: CM Consult   Living arrangements for the past 2 months: Single Family Home                 DME Arranged: Wheelchair manual, Gel overlay, Hospital bed, 3-N-1, Walker rolling DME Agency: [employer] Date DME Agency Contacted: 06/06/20 Time DME Agency Contacted: 70 Representative spoke with at DME Agency: [place]: RN, PT, OT, Nurse's Aide, Social Work, Refused SNF [place] Date [place]: 06/06/20 Time [place]: 60 Representative spoke with at [place]: [place] ([place]) Interventions    Readmission Risk Interventions No flowsheet data found.

## 2020-06-06 NOTE — Progress Notes (Signed)
Nephrology Follow-Up Consult note   Assessment/Recommendations: [name] is a/an 74 y.o. male with a past medical history significant for atrial fibrillation, DM 2, CHF, CKD status post renal transplant in 2005, HTN, HLD, admitted for altered mental status.  Hospitalization complicated by AKI and acute gout     Nonoliguric AKI: Baseline creatinine 2-2.5.  Initial AKI with creatinine up to 2.7 that improved with hydration and holding of medications.  Creatinine rose again for unclear cause (bladder scan and urinalysis reassuring) but now significantly improved; even below baseline at 1.66 likely due to fluid excess. He is stable to DC from nephrology stand point. We will f/u tac level outpt and plan f/u visit -Follow-up tacrolimus level -Start back lasix 40mg  daily; cont to hold ARB -Avoid nephrotoxic agents  Volume Status: Appears volume overloaded. Will start back lasix 40mg  daily and recommend BMP next week  Hypertension: history of such. BP now rising. Inc coreg to home dose of 6.25mg  BID and hydral restarted. Likely due to fluid excess. Lasix as above  H/o Kidney transplant: 2005. Tac trough 4.5 on 9/7 within goal. Was on SL tac earlier so will repeat trough to see where we are now. Continue cellcept 500mg  Bid and tac 1mg  capsule BID. Continue oral pred.  DM2: mgmt per primary  Gout: oral pred and allopurinol per primary  NAGMA: mild likely related to AKI. Bicarb 22 today. Will stop bicarb  PVD: nonhealing right index finger. Primary team managing.   Dispo: Awaiting rehab. Hopefully dc today   Recommendations conveyed to primary service.    [facility] 06/06/2020 12:55 PM  ___________________________________________________________  CC: AKI on CKD  Interval History/Subjective: Feels well with no complaints. Feels like he has extra fluid on. Mild SOB.  Medications:  Current Facility-Administered Medications  Medication Dose Route  Frequency Provider Last Rate Last Admin  . (feeding supplement) PROSource Plus liquid 30 mL  30 mL Oral Daily Florencia Reasons, MD   30 mL at 06/05/20 2137  . 0.9 %  sodium chloride infusion (Manually program via Guardrails IV Fluids)   Intravenous Once Rai, Ripudeep K, MD      . 0.9 %  sodium chloride infusion   Intravenous PRN Rai, Ripudeep K, MD 10 mL/hr at 05/31/20 0754 250 mL at 05/31/20 0754  . albuterol (PROVENTIL) (2.5 MG/3ML) 0.083% nebulizer solution 2.5 mg  2.5 mg Nebulization Q2H PRN Rai, Ripudeep K, MD      . allopurinol (ZYLOPRIM) tablet 150 mg  150 mg Oral Daily Rai, Ripudeep K, MD   150 mg at 06/05/20 0847  . atorvastatin (LIPITOR) tablet 10 mg  10 mg Oral Daily Rai, Ripudeep K, MD   10 mg at 06/05/20 0849  . carvedilol (COREG) tablet 6.25 mg  6.25 mg Oral BID WC Reesa Chew, MD   6.25 mg at 06/06/20 0814  . Chlorhexidine Gluconate Cloth 2 % PADS 6 each  6 each Topical Q0600 Rai, Vernelle Emerald, MD   6 each at 06/05/20 [id number]  . feeding supplement (ENSURE ENLIVE) (ENSURE ENLIVE) liquid 237 mL  237 mL Oral BID BM Rai, Ripudeep K, MD   237 mL at 06/05/20 0847  . feeding supplement (KATE FARMS STANDARD 1.4) liquid 325 mL  325 mL Oral Daily Florencia Reasons, MD   325 mL at 06/05/20 0849  . fentaNYL (SUBLIMAZE) injection 12.5 mcg  12.5 mcg Intravenous Q4H PRN Rai, Ripudeep K, MD   12.5 mcg at 06/01/20 0232  . furosemide (LASIX) tablet 40  mg  40 mg Oral Daily Reesa Chew, MD      . gabapentin (NEURONTIN) capsule 100 mg  100 mg Oral BID Rai, Ripudeep K, MD   100 mg at 06/05/20 2135  . heparin injection 5,000 Units  5,000 Units Subcutaneous Q8H Rai, Ripudeep K, MD   5,000 Units at 06/05/20 2135  . hydrALAZINE (APRESOLINE) injection 10 mg  10 mg Intravenous Q6H PRN Rai, Ripudeep K, MD   10 mg at 06/06/20 0807  . hydrALAZINE (APRESOLINE) tablet 25 mg  25 mg Oral TID Florencia Reasons, MD      . insulin aspart (novoLOG) injection 0-20 Units  0-20 Units Subcutaneous TID WC Rai, Ripudeep K, MD   3 Units at 06/05/20  1824  . insulin aspart (novoLOG) injection 0-5 Units  0-5 Units Subcutaneous QHS Rai, Ripudeep K, MD   4 Units at 06/05/20 2136  . insulin aspart (novoLOG) injection 6 Units  6 Units Subcutaneous TID WC Rai, Ripudeep K, MD   6 Units at 06/05/20 1233  . insulin glargine (LANTUS) injection 20 Units  20 Units Subcutaneous QHS Rai, Ripudeep K, MD   20 Units at 06/05/20 2138  . MEDLINE mouth rinse  15 mL Mouth Rinse BID Rai, Ripudeep K, MD   15 mL at 06/05/20 2142  . metoprolol tartrate (LOPRESSOR) injection 5 mg  5 mg Intravenous Q6H PRN Rai, Ripudeep K, MD      . multivitamin with minerals tablet 1 tablet  1 tablet Oral Daily Florencia Reasons, MD   1 tablet at 06/05/20 0848  . mycophenolate (CELLCEPT) capsule 500 mg  500 mg Oral BID Rai, Ripudeep K, MD   500 mg at 06/05/20 2134  . [START ON 06/07/2020] predniSONE (DELTASONE) tablet 10 mg  10 mg Oral Q breakfast Florencia Reasons, MD      . senna-docusate (Senokot-S) tablet 1 tablet  1 tablet Oral QHS Florencia Reasons, MD      . sodium bicarbonate tablet 650 mg  650 mg Oral BID Reesa Chew, MD      . sodium chloride flush (NS) 0.9 % injection 10-40 mL  10-40 mL Intracatheter Q12H Rai, Ripudeep K, MD   20 mL at 05/31/20 2323  . sodium chloride flush (NS) 0.9 % injection 10-40 mL  10-40 mL Intracatheter PRN Rai, Ripudeep K, MD   10 mL at 06/04/20 0458  . sodium chloride flush (NS) 0.9 % injection 3 mL  3 mL Intravenous Q12H Rai, Ripudeep K, MD   3 mL at 05/31/20 2323  . tacrolimus (PROGRAF) capsule 1 mg  1 mg Oral BID Rai, Ripudeep K, MD   1 mg at 06/05/20 2134      Review of Systems: 10 systems reviewed and negative except per interval history/subjective  Physical Exam: Vitals:   06/06/20 0803 06/06/20 1231  BP: (!) 170/103 (!) 152/76  Pulse: (!) 57 (!) 55  Resp:  18  Temp:  97.8 F (36.6 C)  SpO2:  100%   No intake/output data recorded.  Intake/Output Summary (Last 24 hours) at 06/06/2020 1255 Last data filed at 06/06/2020 0600 Gross per 24 hour  Intake  480 ml  Output 2450 ml  Net -1970 ml   Constitutional: sitting in chair no distress ENMT: ears and nose without scars or lesions, MMM CV: normal rate, 2+ pitting edema in the right upper extremity, trace pitting edema in the bilateral lower extremities Respiratory: Bilateral chest rise, normal work of breathing Gastrointestinal: soft, non-tender, no palpable masses or  hernias Skin: Right finger wrapped in gauze, otherwise no visible lesions or rashes Psych: alert, judgement/insight appropriate, appropriate mood and affect   Test Results I personally reviewed new and old clinical labs and radiology tests Lab Results  Component Value Date   NA 141 06/06/2020   K 4.9 06/06/2020   CL 110 06/06/2020   CO2 22 06/06/2020   BUN 63 (H) 06/06/2020   CREATININE 1.66 (H) 06/06/2020   CALCIUM 8.8 (L) 06/06/2020   ALBUMIN 2.4 (L) 06/06/2020   ALBUMIN 2.5 (L) 06/06/2020   PHOS 2.4 (L) 06/06/2020

## 2020-06-06 NOTE — Discharge Summary (Signed)
Discharge Summary  [name] SAY:[id number] DOB: [birth year]  PCP: [name], MD  Admit date: 05/26/2020 Discharge date: 06/06/2020  Time spent: 58mins, more than 50% time spent on coordination of care.   Recommendations for Outpatient Follow-up:  1. F/u with PCP/nephrology  within a week  for hospital discharge follow up, repeat cbc/bmp at follow up, will follow to decide on when to restart on Cozaar 2. Follow-up with hand surgery Dr. Fredna Dow 3. Family declined skilled nursing facility placement, there is no bed at [facility], they elected to go home with home health  Discharge Diagnoses:  Active Hospital Problems   Diagnosis Date Noted  . Altered mental status 05/26/2020  . Encounter for central line placement   . Sepsis ([place]) 05/26/2020  . Hypernatremia 05/26/2020  . Increased anion gap metabolic acidosis 23/55/7322  . Acute metabolic encephalopathy 02/54/2706  . Diabetes mellitus type 2, insulin dependent ([place]) 09/06/2018  . Essential hypertension 03/25/2017  . Kidney transplant status, cadaveric 10/15/2015    Resolved Hospital Problems  No resolved problems to display.    Discharge Condition: stable  Diet recommendation: heart healthy/carb modified  Filed Weights   06/01/20 0600 06/02/20 0500 06/06/20 0600  Weight: 91.6 kg 92.9 kg 88.8 kg    History of present illness: (Per admitting MD Dr. 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  On arrival he was screaming and yelling uncontrollably however does stop to answer some questions per ED physician.  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 At baseline he is normally conversant and answers questions normally but wife is concerned the pain medication (Norco) he took from his hand surgery made him delirious. She denies a history of osa  ED Course: T100.103F, HR 110, RR 20, BP 224/102, SPO2 93% on room air.  Noted to be belligerent and yelling and required chemical sedation for patient and staff safety and full work-up.  Given patient's lack of cooperation and difficulty with evaluation full trauma CTs were obtained without contrast without any evidence of acute traumatic injury to chest abdomen or pelvis thoracic and lumbar spine x-rays negative and with small pleural effusions bilaterally without respiratory distress or hypoxia.  Notable labs: Na 149, K5.4, chloride 122, CO2 12, glucose 152, BUN 74, creatinine 2.71, AG 15, AST 63, ALT 14, T bili 1.4, CK 1566, troponin I82, WBC 12.0, Hb 9.8, platelets 111, INR 1.5, UA negative, UDS positive for opiates.  He received cefepime, vancomycin, 1 L NS bolus,, labetalol 20 mg x 1, Geodon milligrams x1.   Hospital Course:  Principal Problem:   Altered mental status Active Problems:   Kidney transplant status, cadaveric   Essential hypertension   Acute metabolic encephalopathy   Diabetes mellitus type 2, insulin dependent (HCC)   Sepsis (HCC)   Hypernatremia   Increased anion gap metabolic acidosis   Encounter for central line placement   Acute metabolic encephalopathy, present on  admission - Likely multifactorial secondary to profound dehydration, hypernatremia, AKI and opiate use.  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  - UA + ketones likely due to poor oral intake. - Urine drug screen positive for opiates which he was prescribed on last discharge.  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 Seen by nephrology and wasstarted on bicarb supplemented D5W initally -aki /hypernatremia/acidosis  resolved,  -he is cleared to discharge home by nephrology  -continue on cellcept, Prograf and prednisone  -Lasix resume at a lower dose 40 mg daily, losartan held in the hospital, nephrology recommend continue hold losartan for now, patient is advised to follow-up with nephrology to decide on when to resume losartan   Rhabdomyolysis, elevated CK -Patient with CK level~1500. Likely secondary to falls, being on the floor. Patient received IV fluid hydration, currently off hydration, encourage oral intake -Statin was placed on hold, ck normalized, statin resumed -f/u with pcp  Normocytic anemia with h/o anemia of chronic disease -Hemoglobin also slowly trending down, likely has some hemodilution due to IV fluids, hemoglobin 7.2 -anemia panel : b12/folate unremarkable, iron panel not consistent with iron deficiency, retic count inappropriately low, FOBT negative, -he refused pRBC transfusion, he agreed to epo injection ( reports getting epo injection monthly ) -he received asanesp x1 on 9/15  Hypertensive urgency: -Patient presented with BP 224/102 in the setting of confusion and agitation.  -he then developed hypotension requiring holding bp meds -bp start to trend up close to discharge, home meds coreg, hydralazine resumed,  lasix resumed at a lower dose, continue hold Cozaar per nephrology recommendation -f/u with pcp/nephrology   Elevated troponin -In the setting of renal failure and possible demand ischemia from uncontrolled blood pressure and aki -EKG with nonspecific ST-T changes.  -Echocardiogram shows preserved EF -he denies chest pain  Chronic diastolic CHF: - Repeat echo shows preserved EF. -Currently Lasix placed on hold due to aki -renal function improved, lasix resumed at 40mg  daily per nephrology recommendation -he appear euvolemic at discharge -f/u with nephrology    Paroxysmal atrial fibrillation/aflutter - EKG on admission showed sinus rhythm  -Currently rate controlled, not on chronic anticoagulation due to high risk of falls    Insulin dependent Diabetes mellitus type 2, uncontrolled with hyperglycemia likely due to steroids -Labs showed beta hydroxybutyrate elevation at 4.35 on presentation raising possibility of mild DKA.  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 Fredna Dow. - dressing changed on 9/9by Dr Fredna Dow .- there is a concern of sepsis on presentation, he does has fever on 100.6, tachypnea and tachycardia on presentation.  -Blood cultures negative so far, urine cultures less than 10k CFU. MRSA PCR negative. -Patient was placed on IV antibiotics.Vanc/maxipime/flagyl on admission, then ancef then augmentin  to cover Proteus from prior wound cultures -  he hascompleted antibiotics. per Dr. Fredna Dow, index finger lookedgood during dressing change. - Dr. Fredna Dow and recommended "ensure coban not placed too tight. If bandaids work better for maintaining wound coverage they may be used  instead. Keep wound clean and dry. Encouraged gentle motion of other digits and hand to help with swelling. Okay to follow up after discharge. Contact if any concerns".  Acute gout -Much improved, moving all 4 extremities -Continue allopurinol 100mg  daily --finished  prednisone 40 mg daily for 5 days for acute gout attack, last dose on 9/15, taper prednisone back to baseline prednisone 5mg  daily  Superficial skin tears on extremities, pressure injury, left lower arm posterior skin tears, present on admission - Wound care recommendations appreciated "Reason for Consult:Skin tears sustained during fall to upper and lower extremities, upper>lower. Wound type:Traumatic Pressure Injury POA: N/A Measurement:Bedside RN to measure with first dressing change Wound bed:red, moist on upper extremities, lower extremities are red, dry Drainage (amount, consistency, odor)scant serous Periwound:ecchymotic, dry Dressing procedure/placement/frequency:I will implement features of the house skin tear protocol with an antimicrobial nonadherent dressing used as a wound contact layer (xeroform). Topper dressing will be silicone foam for atraumatic removal."    DVT prophylaxis while in the hospital: heparin injection 5,000 Units Start: 05/26/20 1400 SCDs Start: 05/26/20 1026   Code Status:full Family Communication: wife at bedside Disposition:   Status is: Inpatient  Dispo: The patient is from: home  Anticipated d/c is to:  home with home health , patient does not want snf      Consultants:   Critical care  Nephrology  Hand surgery Dr Fredna Dow  CIR  woc  Procedures:  Left IJ central  line placement by critical care at bedside on 9/9, removed at discharge  Antimicrobials:   Vanc/maxipime/flagyl on admission, then ancef then augmentin   Discharge Exam: BP (!) 152/76 (BP Location: Left Arm)   Pulse (!) 55   Temp 97.8 F (36.6  C) (Oral)   Resp 18   Ht 5\' 9"  (1.753 m)   Wt 88.8 kg   SpO2 100%   BMI 28.91 kg/m   General: NAD, aaox3,  Cardiovascular: IRRR Respiratory: CTABL  Discharge Instructions You were cared for by a hospitalist during your hospital stay. If you have any questions about your discharge medications or the care you received while you were in the hospital after you are discharged, you can call the unit and asked to speak with the hospitalist on call if the hospitalist that took care of you is not available. Once you are discharged, your primary care physician will handle any further medical issues. Please note that NO REFILLS for any discharge medications will be authorized once you are discharged, as it is imperative that you return to your primary care physician (or establish a relationship with a primary care physician if you do not have one) for your aftercare needs so that they can reassess your need for medications and monitor your lab values.  Discharge Instructions    Diet - low sodium heart healthy   Complete by: As directed    Carb modified diet   Discharge wound care:   Complete by: As directed    Applied Xeroform gauze pop with silicone foam, change Xeroform daily, may use silicone foam at 3 days   Increase activity slowly   Complete by: As directed      Allergies as of 06/06/2020      Reactions   Elavil [amitriptyline Hcl] Other (See Comments)   Unknown  felt as if he was tripping      Medication List    STOP taking these medications   losartan 25 MG tablet Commonly known as: COZAAR     TAKE these medications   acetaminophen 500 MG tablet Commonly known as: TYLENOL Take 1,000 mg by mouth every 6 (six) hours as needed for mild pain or moderate pain.   allopurinol 100 MG tablet Commonly known as: Zyloprim Take 1 tablet (100 mg total) by mouth daily.   aspirin 81 MG tablet Take 162 mg by mouth 2 (two) times daily.   atorvastatin 10 MG tablet Commonly known as:  LIPITOR Take 10 mg by mouth daily.   B-D SINGLE USE SWABS REGULAR Pads Use as directed   Bayer Contour Next Test test strip Generic drug: glucose blood Use as directed   BD Pen Needle Nano U/F 32G X 4 MM Misc Generic drug: Insulin Pen Needle Use as directed   calcitRIOL 0.25 MCG capsule Commonly known as: ROCALTROL Take 0.25 mcg by mouth daily. daily   carvedilol 6.25 MG tablet Commonly known as: COREG Take 1 tablet (6.25 mg total) by mouth 2 (two) times daily with a meal.   diphenhydrAMINE 25 mg capsule Commonly known as: BENADRYL Take 1 capsule (25 mg total) by mouth at bedtime as needed (for sleep/allergies.).   furosemide 40 MG tablet Commonly known as: LASIX Take 1 tablet (40 mg total) by mouth daily. What changed:   how much to take  additional instructions   gabapentin 100 MG capsule Commonly known as: NEURONTIN Take 100 mg by mouth at bedtime. Take three times/day as needed   HumaLOG KwikPen 100 UNIT/ML KwikPen Generic drug: insulin lispro Inject 3-5 Units into the skin 3 (three) times daily. Sliding scale   hydrALAZINE 25 MG tablet Commonly known as: APRESOLINE Take 25 mg by mouth 3 (three) times daily.   Lantus SoloStar 100 UNIT/ML Solostar Pen Generic drug: insulin glargine Inject 7-15 Units into the skin daily. If blood sugars are >300, use 15 units, <300 use 7 units   latanoprost 0.005 % ophthalmic solution Commonly known as: XALATAN Place 1 drop into both eyes at bedtime.   Microlet Lancets Misc Use as directed   mycophenolate 500 MG tablet Commonly known as: CELLCEPT Take 500 mg by mouth 2 (two) times daily.   oxyCODONE-acetaminophen 5-325 MG tablet Commonly known as: PERCOCET/ROXICET Take 1-2 tablets by mouth every 6 (six) hours as needed.   predniSONE 5 MG tablet Commonly known as: DELTASONE Take 5 mg by mouth daily.   senna-docusate 8.6-50 MG tablet Commonly known as: Senokot-S Take 1 tablet by mouth at bedtime.   tacrolimus  1 MG capsule Commonly known as: PROGRAF Take 1 mg by mouth 2 (two) times daily.   Tradjenta 5 MG Tabs tablet Generic drug: linagliptin Take 5 mg by mouth daily.   Vascepa 1 g capsule Generic drug: icosapent Ethyl Take 2 g by mouth 2 (two) times daily.            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  A walker will not resolve  issue with performing activities of daily living. A wheelchair will allow patient to safely perform daily activities.Length of need 6 months . (THEN ONE OF THESE TWO:) Patient self-propels the wheelchair while engaging in frequent activities such as toileting which cannot be performed in a standard or lightweight wheelchair due to the weight of the chair. Accessories: elevating leg rests (ELRs), wheel locks, extensions and anti-tippers.   06/06/20 1152   06/06/20 1133  For home use only DME 3 n 1  Once        06/06/20 1133           Discharge Care Instructions  (From admission, onward)         Start     Ordered   06/06/20 0000  Discharge wound care:       Comments: Applied Xeroform gauze pop with silicone foam, change Xeroform daily, may use silicone foam at 3 days   06/06/20 1418         Allergies  Allergen Reactions  . Elavil [Amitriptyline Hcl] Other (See Comments)    Unknown felt as if he was tripping    Follow-up Information    [name], MD Follow up in 1 week(s).   Specialty: Nephrology Why: Hospital discharge follow-up in 1 week, repeat BMP to monitor kidney function Nephrology to decide when to restart Cozaar Contact information: [place] [address] [phone]        Leanora Cover, MD Follow up in 1 week(s).   Specialty: Orthopedic Surgery Why: follow up right index finger  Contact information: [place] [address] [phone]        [facility] Follow up.   Specialty: [place] Why: [facility] nursing/physical therapy/occupational therapy/aide/social worker Contact information: [place] [address] [place] [facility] Follow up.   Why: Patient only want bedside commode Contact information: [place] [address] [phone]                The results of significant diagnostics from this hospitalization (including imaging, microbiology, ancillary and laboratory) are listed below for reference.    Significant Diagnostic Studies: CT ABDOMEN PELVIS WO CONTRAST  Result Date: 05/26/2020 CLINICAL DATA:  Fall, found down EXAM: CT CHEST, ABDOMEN AND PELVIS WITHOUT CONTRAST TECHNIQUE: Multidetector CT imaging of the chest, abdomen and pelvis was performed following the standard protocol without IV contrast. COMPARISON:  None. FINDINGS: Motion degraded images. CT CHEST FINDINGS Cardiovascular: Cardiomegaly. No pericardial effusion. Chronic pericardial calcifications. No evidence of thoracic aortic aneurysm. Atherosclerotic calcifications of the aortic arch. Enlargement of the main pulmonary artery, suggesting pulmonary arterial hypertension. Three vessel coronary atherosclerosis. Mediastinum/Nodes: No suspicious mediastinal lymphadenopathy. Visualized thyroid is unremarkable. Lungs/Pleura: Evaluation of the lung parenchyma is constrained by respiratory motion. Within that constraint, there are  no suspicious pulmonary nodules. Small right and trace left pleural effusions. Mild right basilar atelectasis.  No focal consolidation. No pneumothorax. Musculoskeletal: No acute fracture is seen. Specifically, the sternum, clavicles, and scapulae are intact. Ribs are poorly evaluated due to motion degradation. Old left rib fracture deformities without definite  acute fracture. Dedicated thoracic spine evaluation will be reported separately. CT ABDOMEN PELVIS FINDINGS Hepatobiliary: Unenhanced liver is grossly unremarkable. No perihepatic fluid/hemorrhage. Suspected layering gallbladder sludge (series 2/image 25), without associated inflammatory changes. No intrahepatic or extrahepatic ductal dilatation. Pancreas: Within normal limits. Spleen: Within normal limits.  No perisplenic fluid/hemorrhage. Adrenals/Urinary Tract: Adrenal glands are within normal limits. Severe bilateral renal cortical atrophy with a dominant 3.6 cm right upper pole renal cyst. No hydronephrosis. Calcified, amorphous right lower quadrant renal transplant with sequela of rejection (series 2/image 54). Left lower quadrant renal transplant without hydronephrosis. Bladder is within normal limits. Stomach/Bowel: Stomach is within normal limits. No evidence of bowel obstruction. Normal appendix (series 2/image 22). Extensive left colonic diverticulosis, without evidence of diverticulitis. Vascular/Lymphatic: No evidence of abdominal aortic aneurysm. Extensive atherosclerotic calcifications the abdominal aorta and branch vessels. No suspicious abdominopelvic lymphadenopathy. Reproductive: Prostate is grossly unremarkable. Other: No abdominopelvic ascites. No hemoperitoneum or free air. Musculoskeletal: No fracture is seen. Specifically, the visualized bony pelvis and proximal femurs are intact. Dedicated lumbar spine evaluation will be reported separately. IMPRESSION: Motion degraded images. No evidence of traumatic injury to the chest, abdomen, or  pelvis. Dedicated thoracolumbar spine evaluation will be reported separately. Small right and trace left pleural effusions. Additional chronic ancillary findings as above. Electronically Signed   By: Julian Hy M.D.   On: 05/26/2020 06:41   DG Chest 1 View  Result Date: 05/26/2020 CLINICAL DATA:  Altered mental status EXAM: CHEST  1 VIEW COMPARISON:  09/06/2018 FINDINGS: Moderate cardiomegaly. Hazy opacities in the right lung may be exaggerated by rotation. Right pleural effusion. IMPRESSION: Right pleural effusion and moderate cardiomegaly. Electronically Signed   By: Ulyses Jarred M.D.   On: 05/26/2020 06:48   DG Pelvis 1-2 Views  Result Date: 05/26/2020 CLINICAL DATA:  Fall found down EXAM: PELVIS - 1-2 VIEW COMPARISON:  None. FINDINGS: There is no evidence of pelvic fracture or diastasis. No pelvic bone lesions are seen. IMPRESSION: Negative. Electronically Signed   By: Ulyses Jarred M.D.   On: 05/26/2020 06:49   DG Forearm Right  Result Date: 05/26/2020 CLINICAL DATA:  Right forearm injury after fall. EXAM: RIGHT FOREARM - 2 VIEW COMPARISON:  None. FINDINGS: There is no evidence of fracture or other focal bone lesions. Vascular calcifications are noted. Postsurgical changes are noted in the antecubital region. IMPRESSION: Negative. Electronically Signed   By: Marijo Conception M.D.   On: 05/26/2020 09:15   CT Head Wo Contrast  Result Date: 05/26/2020 CLINICAL DATA:  Fall EXAM: CT HEAD WITHOUT CONTRAST CT CERVICAL SPINE WITHOUT CONTRAST TECHNIQUE: Multidetector CT imaging of the head and cervical spine was performed following the standard protocol without intravenous contrast. Multiplanar CT image reconstructions of the cervical spine were also generated. COMPARISON:  None. FINDINGS: CT HEAD FINDINGS Brain: There is no mass, hemorrhage or extra-axial collection. The size and configuration of the ventricles and extra-axial CSF spaces are normal. There is hypoattenuation of the periventricular  white matter, most commonly indicating chronic ischemic microangiopathy. Vascular: Atherosclerotic calcification of the vertebral and internal carotid arteries at the skull base. No abnormal hyperdensity of the major intracranial arteries or dural venous sinuses. Skull: Small hematoma at the scalp vertex.  No skull fracture. Sinuses/Orbits: No fluid levels or advanced mucosal thickening of the visualized paranasal sinuses. No mastoid or middle ear effusion. The orbits are normal. CT CERVICAL SPINE FINDINGS Alignment: No static subluxation. Facets are aligned. Occipital condyles are normally positioned. Skull base and vertebrae: No acute  fracture. Soft tissues and spinal canal: No prevertebral fluid or swelling. No visible canal hematoma. Disc levels: C5-7 degenerative disc disease. Upper chest: No pneumothorax, pulmonary nodule or pleural effusion. Other: Normal visualized paraspinal cervical soft tissues. IMPRESSION: 1. 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 2. Small scalp vertex hematoma without skull fracture. 3. No acute fracture or static subluxation of the cervical spine. Electronically Signed   By: Ulyses Jarred M.D.   On: 05/26/2020 06:44   CT Chest Wo Contrast  Result Date: 05/26/2020 CLINICAL DATA:  Fall, found down EXAM: CT CHEST, ABDOMEN AND PELVIS WITHOUT CONTRAST TECHNIQUE: Multidetector CT imaging of the chest, abdomen and pelvis was performed following the standard protocol without IV contrast. COMPARISON:  None. FINDINGS: Motion degraded images. CT CHEST FINDINGS Cardiovascular: Cardiomegaly. No pericardial effusion. Chronic pericardial calcifications. No evidence of thoracic aortic aneurysm. Atherosclerotic calcifications of the aortic arch. Enlargement of the main pulmonary artery, suggesting pulmonary arterial hypertension. Three vessel coronary atherosclerosis. Mediastinum/Nodes: No suspicious mediastinal lymphadenopathy. Visualized thyroid is unremarkable.  Lungs/Pleura: Evaluation of the lung parenchyma is constrained by respiratory motion. Within that constraint, there are no suspicious pulmonary nodules. Small right and trace left pleural effusions. Mild right basilar atelectasis.  No focal consolidation. No pneumothorax. Musculoskeletal: No acute fracture is seen. Specifically, the sternum, clavicles, and scapulae are intact. Ribs are poorly evaluated due to motion degradation. Old left rib fracture deformities without definite acute fracture. Dedicated thoracic spine evaluation will be reported separately. CT ABDOMEN PELVIS FINDINGS Hepatobiliary: Unenhanced liver is grossly unremarkable. No perihepatic fluid/hemorrhage. Suspected layering gallbladder sludge (series 2/image 25), without associated inflammatory changes. No intrahepatic or extrahepatic ductal dilatation. Pancreas: Within normal limits. Spleen: Within normal limits.  No perisplenic fluid/hemorrhage. Adrenals/Urinary Tract: Adrenal glands are within normal limits. Severe bilateral renal cortical atrophy with a dominant 3.6 cm right upper pole renal cyst. No hydronephrosis. Calcified, amorphous right lower quadrant renal transplant with sequela of rejection (series 2/image 54). Left lower quadrant renal transplant without hydronephrosis. Bladder is within normal limits. Stomach/Bowel: Stomach is within normal limits. No evidence of bowel obstruction. Normal appendix (series 2/image 22). Extensive left colonic diverticulosis, without evidence of diverticulitis. Vascular/Lymphatic: No evidence of abdominal aortic aneurysm. Extensive atherosclerotic calcifications the abdominal aorta and branch vessels. No suspicious abdominopelvic lymphadenopathy. Reproductive: Prostate is grossly unremarkable. Other: No abdominopelvic ascites. No hemoperitoneum or free air. Musculoskeletal: No fracture is seen. Specifically, the visualized bony pelvis and proximal femurs are intact. Dedicated lumbar spine evaluation  will be reported separately. IMPRESSION: Motion degraded images. No evidence of traumatic injury to the chest, abdomen, or pelvis. Dedicated thoracolumbar spine evaluation will be reported separately. Small right and trace left pleural effusions. Additional chronic ancillary findings as above. Electronically Signed   By: Julian Hy M.D.   On: 05/26/2020 06:41   CT Cervical Spine Wo Contrast  Result Date: 05/26/2020 CLINICAL DATA:  Fall EXAM: CT HEAD WITHOUT CONTRAST CT CERVICAL SPINE WITHOUT CONTRAST TECHNIQUE: Multidetector CT imaging of the head and cervical spine was performed following the standard protocol without intravenous contrast. Multiplanar CT image reconstructions of the cervical spine were also generated. COMPARISON:  None. FINDINGS: CT HEAD FINDINGS Brain: There is no mass, hemorrhage or extra-axial collection. The size and configuration of the ventricles and extra-axial CSF spaces are normal. There is hypoattenuation of the periventricular white matter, most commonly indicating chronic ischemic microangiopathy. Vascular: Atherosclerotic calcification of the vertebral and internal carotid arteries at the skull base. No abnormal hyperdensity of the major intracranial arteries or dural venous  sinuses. Skull: Small hematoma at the scalp vertex.  No skull fracture. Sinuses/Orbits: No fluid levels or advanced mucosal thickening of the visualized paranasal sinuses. No mastoid or middle ear effusion. The orbits are normal. CT CERVICAL SPINE FINDINGS Alignment: No static subluxation. Facets are aligned. Occipital condyles are normally positioned. Skull base and vertebrae: No acute fracture. Soft tissues and spinal canal: No prevertebral fluid or swelling. No visible canal hematoma. Disc levels: C5-7 degenerative disc disease. Upper chest: No pneumothorax, pulmonary nodule or pleural effusion. Other: Normal visualized paraspinal cervical soft tissues. IMPRESSION: 1. Chronic ischemic microangiopathy  without acute intracranial abnormality. 2. Small scalp vertex hematoma without skull fracture. 3. No acute fracture or static subluxation of the cervical spine. Electronically Signed   By: Ulyses Jarred M.D.   On: 05/26/2020 06:44   MR BRAIN WO CONTRAST  Result Date: 05/28/2020 CLINICAL DATA:  Delirium EXAM: MRI HEAD WITHOUT CONTRAST TECHNIQUE: Multiplanar, multiecho pulse sequences of the brain and surrounding structures were obtained without intravenous contrast. COMPARISON:  None. FINDINGS: Motion artifact is present. Brain: There is no acute infarction or intracranial hemorrhage. There is no intracranial mass, mass effect, or edema. There is no hydrocephalus or extra-axial fluid collection. Small chronic left occipital infarct. Minimal patchy T2 hyperintensity in the supratentorial white matter is nonspecific but may reflect minor chronic microvascular ischemic changes. Prominence of the ventricles and sulci reflects generalized parenchymal volume loss. Vascular: Major vessel flow voids at the skull base are preserved. Skull and upper cervical spine: Normal marrow signal is preserved. Sinuses/Orbits: Paranasal sinuses are aerated. Orbits are unremarkable. Other: Sella is unremarkable.  Mastoid air cells are clear. IMPRESSION: Motion degraded study. No evidence of recent infarction, hemorrhage, or mass. Small chronic left occipital infarct. Electronically Signed   By: Macy Mis M.D.   On: 05/28/2020 16:27   CT T-SPINE NO CHARGE  Result Date: 05/26/2020 CLINICAL DATA:  Fall EXAM: CT Thoracic and Lumbar spine without contrast TECHNIQUE: Multiplanar CT images of the thoracic and lumbar spine were reconstructed from contemporary CT of the Chest, Abdomen, and Pelvis CONTRAST:  No additional contrast COMPARISON:  None FINDINGS: CT THORACIC SPINE FINDINGS Alignment: Normal. Vertebrae: No acute fracture or focal pathologic process. Disc levels: No spinal canal stenosis CT LUMBAR SPINE FINDINGS Segmentation: 5  lumbar type vertebrae. Alignment: Normal. Vertebrae: No acute fracture or focal pathologic process. Paraspinal and other soft tissues: Negative. Disc levels: No spinal canal stenosis. IMPRESSION: No acute fracture or static subluxation of the thoracic or lumbar spine. Electronically Signed   By: Ulyses Jarred M.D.   On: 05/26/2020 06:35   CT L-SPINE NO CHARGE  Result Date: 05/26/2020 CLINICAL DATA:  Fall EXAM: CT Thoracic and Lumbar spine without contrast TECHNIQUE: Multiplanar CT images of the thoracic and lumbar spine were reconstructed from contemporary CT of the Chest, Abdomen, and Pelvis CONTRAST:  No additional contrast COMPARISON:  None FINDINGS: CT THORACIC SPINE FINDINGS Alignment: Normal. Vertebrae: No acute fracture or focal pathologic process. Disc levels: No spinal canal stenosis CT LUMBAR SPINE FINDINGS Segmentation: 5 lumbar type vertebrae. Alignment: Normal. Vertebrae: No acute fracture or focal pathologic process. Paraspinal and other soft tissues: Negative. Disc levels: No spinal canal stenosis. IMPRESSION: No acute fracture or static subluxation of the thoracic or lumbar spine. Electronically Signed   By: Ulyses Jarred M.D.   On: 05/26/2020 06:35   DG CHEST PORT 1 VIEW  Result Date: 05/29/2020 CLINICAL DATA:  Central line placement EXAM: PORTABLE CHEST 1 VIEW.  Patient is slightly rotated. COMPARISON:  Chest x-ray 05/26/2020, CT chest 05/26/2020. FINDINGS: Interval placement of a left internal jugular central venous catheter with tip overlying the expected region of the superior cavoatrial junction. Moderate cardiomegaly the may be exaggerated due to portable AP technique. The heart size and mediastinal contours are unchanged. Aortic arch calcification. Hilar vasculature prominence. Improved aeration of bilateral lungs. No focal consolidation. No pulmonary edema. Persistent bilateral trace pleural effusions. No pneumothorax. No acute osseous abnormality.  Old healed left rib fractures.  IMPRESSION: New left internal jugular central venous catheter with tip overlying the expected region of the superior cavoatrial junction. Persistent bilateral trace pleural effusions in a patient with moderate cardiomegaly. Electronically Signed   By: Iven Finn M.D.   On: 05/29/2020 13:01   DG Hand Complete Right  Result Date: 05/26/2020 CLINICAL DATA:  Right hand pain after fall. EXAM: RIGHT HAND - COMPLETE 3+ VIEW COMPARISON:  None. 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 Vascular calcifications are noted. No other bony abnormality is noted. Joint spaces are intact. IMPRESSION: Status post amputation of a portion of second middle phalanx as well as the second distal phalanx. Electronically Signed   By: Marijo Conception M.D.   On: 05/26/2020 09:13   ECHOCARDIOGRAM COMPLETE  Result Date: 05/26/2020    ECHOCARDIOGRAM REPORT   Patient Name:   [name] Date of Exam: 05/26/2020 Medical Rec #:  [id number]       Height:       69.0 in Accession #:    [id number]      Weight:       196.0 lb Date of Birth:  12-Nov-1945      BSA:          2.048 m Patient Age:    70 years        BP:           161/69 mmHg Patient Gender: M               HR:           74 bpm. Exam Location:  Inpatient Procedure: 2D Echo, Cardiac Doppler and Color Doppler Indications:    CHF-Acute Diastolic 195.09 / T26.71  History:        Patient has prior history of Echocardiogram examinations, most                 recent 03/01/2019. CHF, Arrythmias:Atrial Fibrillation; Risk                 Factors:Dyslipidemia, Hypertension, Diabetes and Non-Smoker.  Sonographer:    Vickie Epley RDCS Referring Phys: [id number] [place]  1. Left ventricular ejection fraction, by estimation, is 55 to 60%. The left ventricle has normal function. The left ventricle has no regional wall motion abnormalities. Left ventricular diastolic parameters are indeterminate. Elevated left  ventricular end-diastolic pressure.  2. Right ventricular systolic function is normal. The right ventricular size is normal. Tricuspid regurgitation signal is inadequate for assessing PA pressure.  3. The mitral valve is normal in structure. No evidence of mitral valve regurgitation. No evidence of mitral stenosis.  4. The aortic valve is tricuspid. Aortic valve regurgitation is trivial. Mild aortic valve sclerosis is present, with no evidence of aortic valve stenosis.  5. There is mild dilatation of the aortic root measuring 42 mm.  6. The inferior vena cava is dilated in size with <50% respiratory variability, suggesting right atrial pressure of 15  mmHg. FINDINGS  Left Ventricle: Left ventricular ejection fraction, by estimation, is 55 to 60%. The left ventricle has normal function. The left ventricle has no regional wall motion abnormalities. The left ventricular internal cavity size was normal in size. There is  no left ventricular hypertrophy. Left ventricular diastolic parameters are indeterminate. Elevated left ventricular end-diastolic pressure. Right Ventricle: The right ventricular size is normal. No increase in right ventricular wall thickness. Right ventricular systolic function is normal. Tricuspid regurgitation signal is inadequate for assessing PA pressure. Left Atrium: Left atrial size was normal in size. Right Atrium: Right atrial size was normal in size. Pericardium: There is no evidence of pericardial effusion. Mitral Valve: The mitral valve is normal in structure. Normal mobility of the mitral valve leaflets. Mild mitral annular calcification. No evidence of mitral valve regurgitation. No evidence of mitral valve stenosis. Tricuspid Valve: The tricuspid valve is normal in structure. Tricuspid valve regurgitation is trivial. No evidence of tricuspid stenosis. Aortic Valve: The aortic valve is tricuspid. Aortic valve regurgitation is trivial. Mild aortic valve sclerosis is present, with no evidence  of aortic valve stenosis. Pulmonic Valve: The pulmonic valve was normal in structure. Pulmonic valve regurgitation is not visualized. No evidence of pulmonic stenosis. Aorta: The aortic root is normal in size and structure. There is mild dilatation of the aortic root measuring 42 mm. Venous: The inferior vena cava is dilated in size with less than 50% respiratory variability, suggesting right atrial pressure of 15 mmHg. IAS/Shunts: No atrial level shunt detected by color flow Doppler.  LEFT VENTRICLE PLAX 2D LVIDd:         4.70 cm      Diastology LVIDs:         3.30 cm      LV e' lateral:   11.10 cm/s LV PW:         1.00 cm      LV E/e' lateral: 12.1 LV IVS:        1.00 cm      LV e' medial:    7.73 cm/s LVOT diam:     2.80 cm      LV E/e' medial:  17.3 LV SV:         100 LV SV Index:   49 LVOT Area:     6.16 cm  LV Volumes (MOD) LV vol d, MOD A2C: 103.0 ml LV vol d, MOD A4C: 119.0 ml LV vol s, MOD A2C: 43.4 ml LV vol s, MOD A4C: 56.6 ml LV SV MOD A2C:     59.6 ml LV SV MOD A4C:     119.0 ml LV SV MOD BP:      61.0 ml RIGHT VENTRICLE RV S prime:     10.60 cm/s TAPSE (M-mode): 1.2 cm LEFT ATRIUM             Index       RIGHT ATRIUM           Index LA diam:        3.60 cm 1.76 cm/m  RA Area:     17.90 cm LA Vol (A2C):   69.0 ml 33.69 ml/m RA Volume:   53.10 ml  25.92 ml/m LA Vol (A4C):   54.5 ml 26.61 ml/m LA Biplane Vol: 67.1 ml 32.76 ml/m  AORTIC VALVE LVOT Vmax:   87.20 cm/s LVOT Vmean:  57.700 cm/s LVOT VTI:    0.162 m  AORTA Ao Root diam: 4.20 cm MITRAL VALVE MV Area (PHT): 3.48 cm  SHUNTS MV Decel Time: 218 msec     Systemic VTI:  0.16 m MV E velocity: 134.00 cm/s  Systemic Diam: 2.80 cm MV A velocity: 41.60 cm/s MV E/A ratio:  3.22 Fransico Him MD Electronically signed by Fransico Him MD Signature Date/Time: 05/26/2020/12:12:24 PM    Final     Microbiology: No results found for this or any previous visit (from the past 240 hour(s)).   Labs: Basic Metabolic Panel: Recent Labs  Lab  06/02/20 0329 06/03/20 0400 06/04/20 0911 06/05/20 0338 06/06/20 0400  NA 133* 134* 140 139 141  K 4.9 4.2 4.3 4.6 4.9  CL 102 105 108 109 110  CO2 20* 19* 19* 20* 22  GLUCOSE 286* 146* 121* 185* 240*  BUN 78* 82* 88* 85* 63*  CREATININE 2.66* 2.73* 2.43* 2.02* 1.66*  CALCIUM 8.3* 8.1* 8.4* 8.5* 8.8*  PHOS  --   --  3.1 3.0 2.4*   Liver Function Tests: Recent Labs  Lab 06/03/20 1658 06/04/20 0911 06/05/20 0338 06/06/20 0400  AST  --   --   --  51*  ALT  --   --   --  34  ALKPHOS  --   --   --  74  BILITOT  --   --   --  0.6  PROT  --   --   --  5.1*  ALBUMIN 2.5* 2.4* 2.7* 2.5*  2.4*   No results for input(s): LIPASE, AMYLASE in the last 168 hours. No results for input(s): AMMONIA in the last 168 hours. CBC: Recent Labs  Lab 06/01/20 0939 06/02/20 0329 06/03/20 0400 06/05/20 0338 06/06/20 0400  WBC 13.0* 9.8 10.3 11.8* 11.4*  NEUTROABS  --   --   --  9.9* 9.7*  HGB 7.4* 7.3* 7.2* 8.0* 7.4*  HCT 24.7* 24.1* 23.5* 27.1* 24.9*  MCV 95.4 94.1 93.6 94.8 95.8  PLT 81* 87* 112* 193 193   Cardiac Enzymes: Recent Labs  Lab 06/06/20 0400  CKTOTAL 166   BNP: BNP (last 3 results) Recent Labs    05/26/20 0450  BNP 809.3*    ProBNP (last 3 results) No results for input(s): PROBNP in the last 8760 hours.  CBG: Recent Labs  Lab 06/05/20 1127 06/05/20 1621 06/05/20 2053 06/06/20 0824 06/06/20 1209  GLUCAP 184* 127* 322* 249* 297*       Signed:  Florencia Reasons MD, PhD, FACP  [facility] 06/06/2020, 3:42 PM

## 2020-06-06 NOTE — TOC Progression Note (Signed)
Transition of Care [facility]) - Progression Note    Patient Details  Name: [name] MRN: [id number] Date of Birth: 11-22-1945  Transition of [facility]) CM/SW Contact  Emmer Lillibridge, Juliann Pulse, RN Phone Number: 06/06/2020, 1:00 PM  Clinical Narrative: There are no CIR's able to accept see prior note. Spoke to dtr Holt & informed yesterday & again today-she voiced understanding. She says family decline SNF-prefer home w/HHC-[facility] already following-HHRN/PT/OT/aide/CSW rep Amy aware. Per dtr dme needed-hospital bed w/mattress/w/c/rw(informed may or may not be covered)/3n1-[employer] rep Betsy aware-will need home dme ordered, & narrative for w/c,& hospital bed. Delivery of dme to home prior patient arriving. PTAR may or may not be needed. PTAR forms in shadow chart if needed.    Expected Discharge Plan: [place] Barriers to Discharge: Continued Medical Work up  Expected Discharge Plan and Services Expected Discharge Plan: [place]   Discharge Planning Services: CM Consult   Living arrangements for the past 2 months: Single Family Home                 DME Arranged: Wheelchair manual, Gel overlay, Hospital bed, 3-N-1, Walker rolling DME Agency: [employer] Date DME Agency Contacted: 06/06/20 Time DME Agency Contacted: 49 Representative spoke with at DME Agency: [place]: RN, PT, OT, Nurse's Aide, Social Work, Refused SNF [place] Date [place]: 06/06/20 Time [place]: 107 Representative spoke with at [place]: [place] ([place]) Interventions    Readmission Risk Interventions No flowsheet data found.

## 2020-06-06 NOTE — Progress Notes (Signed)
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  Patient apparently awake but confused, complaining of right-sided neck and back pain. 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 Fredna Dow on 9/2/202.  Pt admitted for Acute metabolic encephalopathy: Present on admission.  Likely multifactorial secondary to profound dehydration, hypernatremia, AKI and opiate use    PT Comments    Pt looking better.  OOB in recliner via OT.  Assisted with amb.  General Gait Details: Used B platform EVA walker for increased support as pt was unable to support his weight.  Profound weakness B LE esp L LE.  Heavy lean on EVA walker but tolerated an increased distance.   Pt would benefit from an aggressive Rehab program  Follow Up Recommendations  CIR     Equipment Recommendations       Recommendations for Other Services       Precautions / Restrictions Precautions Precautions: Fall Precaution Comments: R hand pain and profound L LE weakness    Mobility  Bed Mobility               General bed mobility comments: OOB in recliner  Transfers Overall transfer level: Needs assistance   Transfers: Sit to/from Stand Sit to Stand: Max assist         General transfer comment: required increased assist from lower recliner level.  Very weak Quads L>R.  Once upright, pt will lock knees to support self.  25% VC's on proper hand transfer.  Ambulation/Gait Ambulation/Gait assistance: Max assist Gait Distance (Feet): 28 Feet Assistive device: Bilateral platform walker Gait  Pattern/deviations: Step-to pattern;Decreased stance time - left Gait velocity: decreased   General Gait Details: Used B platform EVA walker for increased support as pt was unable to support his weight.  Profound weakness B LE esp L LE.  Heavy lean on EVA walker but tolerated an increased distance.  .   Stairs             Wheelchair Mobility    Modified Rankin (Stroke Patients Only)       Balance                                            Cognition Arousal/Alertness: Awake/alert Behavior During Therapy: WFL for tasks assessed/performed Overall Cognitive Status: Within Functional Limits for tasks assessed                                 General Comments: AxO x 3 and feeling better      Exercises      General Comments        Pertinent Vitals/Pain Pain Assessment: 0-10 Pain Score: 3  Pain Location: R hand - Pain Descriptors / Indicators: Constant Pain Intervention(s): Monitored during session    Home Living                      Prior Function  PT Goals (current goals can now be found in the care plan section) Progress towards PT goals: Progressing toward goals    Frequency    Min 2X/week      PT Plan Current plan remains appropriate    Co-evaluation              AM-PAC PT "6 Clicks" Mobility   Outcome Measure  Help needed turning from your back to your side while in a flat bed without using bedrails?: A Lot Help needed moving from lying on your back to sitting on the side of a flat bed without using bedrails?: A Lot Help needed moving to and from a bed to a chair (including a wheelchair)?: A Lot Help needed standing up from a chair using your arms (e.g., wheelchair or bedside chair)?: A Lot Help needed to walk in hospital room?: A Lot Help needed climbing 3-5 steps with a railing? : Total 6 Click Score: 11    End of Session Equipment Utilized During Treatment: Gait belt Activity  Tolerance: Patient limited by fatigue Patient left: in chair;with bed alarm set;with call bell/phone within reach;with family/visitor present Nurse Communication: Mobility status PT Visit Diagnosis: Difficulty in walking, not elsewhere classified (R26.2);Muscle weakness (generalized) (M62.81)     Time: 6834-1962 PT Time Calculation (min) (ACUTE ONLY): 12 min  Charges:  $Gait Training: 8-22 mins                     Rica Koyanagi  PTA Acute  Rehabilitation Services Pager      [phone] Office      [phone]

## 2020-06-06 NOTE — Care Management (Cosign Needed)
    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  A walker will not resolve  issue with performing activities of daily living. A wheelchair will allow patient to safely perform daily activities.Length of need 6 months . (THEN ONE OF THESE TWO:) Patient self-propels the wheelchair while engaging in frequent activities such as toileting which cannot be performed in a standard or lightweight wheelchair due to the weight of the chair. Accessories: elevating leg rests (ELRs), wheel locks, extensions and anti-tippers.   06/06/20 1152   06/06/20 1133  For home use only DME 3 n 1  Once        06/06/20 1133

## 2020-06-06 NOTE — Progress Notes (Signed)
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  Patient apparently awake but confused, complaining of right-sided neck and back pain. 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 Fredna Dow on 9/2/202.  Pt admitted for Acute metabolic encephalopathy: Present on admission.  Likely multifactorial secondary to profound dehydration, hypernatremia, AKI and opiate use   OT comments  Patient reports improvement in pain and ability to move legs and arms actively. Patient min assist to transfer to side of bed needing hand hold to pull himself to the edge of bed. Attempted use of RW but patient unable to use right hand on walker and attempts of bearing weight through forearm on walker - ineffective. Patient min assist to stand using left hand to hold onto bed red. Patient able to hold onto [facility] arm with left hand and place right arm over therapist in order to take steps and pivot to [facility] in a slow and controlled manner. Patient total assist for toileting due to using his left hand for steadying himself and right hand unable due to pain. Patient able to perform transfer to recliner again with hand holds and min assist from therapist for [place] - with some use of right hand for hand holds. Patient performed grooming tasks seated in recliner with compensatory use of left hand to open toothpaste. Patient happy with his progress today. Cont POC.   Follow Up Recommendations  SNF;CIR    Equipment Recommendations  3 in 1 bedside commode;Tub/shower seat    Recommendations for Other  Services      Precautions / Restrictions         Mobility Bed Mobility Overal bed mobility: Needs Assistance Bed Mobility: Supine to Sit     Supine to sit: Min assist     General bed mobility comments: min assist for hand hold to pull himself fully into sitting.  Transfers Overall transfer level: Needs assistance Equipment used: None;Rolling walker (2 wheeled) Transfers: Sit to/from [facility] Sit to Stand: Min assist Stand pivot transfers: Min assist       General transfer comment: min assist from elevated bed height to stand with walker. [place] unable to use secondary to right hand pain. Patient able to pivot with min assist using hand holds to transfer to [facility] and then to recliner.    Balance Overall balance assessment: Needs assistance Sitting-balance support: No upper extremity supported;Feet supported Sitting balance-Leahy Scale: Good     Standing balance support: Single extremity supported Standing balance-Leahy Scale: Poor Standing balance comment: needs hand hold to stay upright at this time.                           ADL either performed or assessed with clinical judgement   ADL       Grooming: Oral care;Wash/dry face;Wash/dry hands;Sitting Grooming Details (indicate cue type and reason): Performed grooming sitting in recliner. COmpensated using left hand to open toothpaste.                 Toilet Transfer: Minimal assistance;BSC;Stand-pivot Armed forces technical officer Details (indicate cue type and reason): stand  pivot to the left with min assist. LImied by pain in right hand and unable to use walker or grasp effectively Toileting- Clothing Manipulation and Hygiene: Total assistance;Sit to/from stand Toileting - Clothing Manipulation Details (indicate cue type and reason): total assist for hygiene and clothing management due to needing to hold onto with left hand to stand and inability to use right dominant hand.              Vision   Vision Assessment?: No apparent visual deficits   Perception     Praxis      Cognition Arousal/Alertness: Awake/alert Behavior During Therapy: WFL for tasks assessed/performed Overall Cognitive Status: Within Functional Limits for tasks assessed                                          Exercises     Shoulder Instructions       General Comments      Pertinent Vitals/ Pain       Faces Pain Scale: Hurts whole lot Pain Location: R hand - Pain Descriptors / Indicators: Grimacing;Guarding;Shooting;Other (Comment) Pain Intervention(s): Limited activity within patient's tolerance  Home Living                                          Prior Functioning/Environment              Frequency  Min 2X/week        Progress Toward Goals  OT Goals(current goals can now be found in the care plan section)  Progress towards OT goals: Progressing toward goals  Acute Rehab OT Goals Patient Stated Goal: To go to the bathroom OT Goal Formulation: With patient/family Time For Goal Achievement: 06/15/20 Potential to Achieve Goals: Good  Plan Discharge plan remains appropriate    Co-evaluation                 AM-PAC OT "6 Clicks" Daily Activity     Outcome Measure   Help from another person eating meals?: A Little Help from another person taking care of personal grooming?: A Little Help from another person toileting, which includes using toliet, bedpan, or urinal?: Total Help from another person bathing (including washing, rinsing, drying)?: A Lot Help from another person to put on and taking off regular upper body clothing?: A Little Help from another person to put on and taking off regular lower body clothing?: Total 6 Click Score: 13    End of Session Equipment Utilized During Treatment: Gait belt;Rolling walker  OT Visit Diagnosis: Other abnormalities of gait and mobility (R26.89);History of falling  (Z91.81);Muscle weakness (generalized) (M62.81);Pain   Activity Tolerance Patient tolerated treatment well   Patient Left in chair;with call bell/phone within reach;with chair alarm set   Nurse Communication Mobility status        Time: 6270-3500 OT Time Calculation (min): 32 min  Charges: OT General Charges $OT Visit: 1 Visit OT Treatments $Self Care/Home Management : 8-22 mins $Therapeutic Activity: 8-22 mins  Ely Ballen, OTR/L [place]  Office [phone] Pager: [place] 06/06/2020, 11:03 AM

## 2020-06-06 NOTE — Progress Notes (Signed)
Educated and demonstrated dressing changes to skin tears for the wife. Sent home Xeroform and silicone foam. Instructed patient and wife to change Xeroform daily and foam every three days.

## 2020-06-07 LAB — TYPE AND SCREEN
ABO/RH(D): O POS
Antibody Screen: NEGATIVE
Unit division: 0

## 2020-06-07 LAB — BPAM RBC
Blood Product Expiration Date: 202110082359
Unit Type and Rh: 5100

## 2020-06-07 LAB — TACROLIMUS LEVEL: Tacrolimus (FK506) - LabCorp: 12.8 ng/mL (ref 2.0–20.0)

## 2020-07-04 ENCOUNTER — Encounter (HOSPITAL_BASED_OUTPATIENT_CLINIC_OR_DEPARTMENT_OTHER): Payer: Medicare Other | Attending: Internal Medicine | Admitting: Internal Medicine

## 2020-07-04 DIAGNOSIS — L97521 Non-pressure chronic ulcer of other part of left foot limited to breakdown of skin: Secondary | ICD-10-CM | POA: Diagnosis not present

## 2020-07-04 DIAGNOSIS — L97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er exposed: Secondary | ICD-10-CM | POA: Diagnosis not present

## 2020-07-04 DIAGNOSIS — E11621 Type 2 diabetes mellitus with foot ulcer: Secondary | ICD-10-CM | POA: Diagnosis not present

## 2020-07-04 DIAGNOSIS — I87323 Chronic venous hypertension (idiopathic) with inflammation of bilateral lower extremity: Secondary | ICD-10-CM | POA: Diagnosis not present

## 2020-07-04 DIAGNOSIS — S40811D Abrasion of right upper arm, subsequent encounter: Secondary | ICD-10-CM | POA: Insufficient documentation

## 2020-07-04 DIAGNOSIS — X58XXXD Exposure to other specified factors, subsequent encounter: Secondary | ICD-10-CM | POA: Insufficient documentation

## 2020-07-04 DIAGNOSIS — E1122 Type 2 diabetes mellitus with diabetic chronic kidney disease: Secondary | ICD-10-CM | POA: Diagnosis present

## 2020-07-04 DIAGNOSIS — L97211 Non-pressure chronic ulcer of right calf limited to breakdown of skin: Secondary | ICD-10-CM | POA: Insufficient documentation

## 2020-07-04 DIAGNOSIS — Z888 Allergy status to other drugs, medicaments and biological substances status: Secondary | ICD-10-CM | POA: Insufficient documentation

## 2020-07-04 DIAGNOSIS — S40812D Abrasion of left upper arm, subsequent encounter: Secondary | ICD-10-CM | POA: Insufficient documentation

## 2020-07-04 NOTE — Progress Notes (Signed)
[name], [name] ([id number]) Visit Report for 07/04/2020 Abuse/Suicide Risk Screen Details Patient Name: Date of Service: [name], [name] 07/04/2020 10:30 [name] Medical Record Number: [id number] Patient Account Number: [id number] Date of Birth/Sex: Treating RN: 07-16-1946 (73 y.o. [name]) Carlene Coria Primary Care Cammeron Greis: [name] Other Clinician: Referring [name] [name]: Treating Layni Kreamer/Extender: [name] in Treatment: 0 Abuse/Suicide Risk Screen Items Answer ABUSE RISK SCREEN: Has anyone close to you tried to hurt or harm you recentlyo No Do you feel uncomfortable with anyone in your familyo No Has anyone forced you do things that you didnt want to doo No Electronic Signature(s) Signed: 07/04/2020 6:06:03 PM By: Carlene Coria RN Entered By: Carlene Coria on 07/04/2020 10:37:09 -------------------------------------------------------------------------------- Activities of Daily Living Details Patient Name: Date of Service: [name], [name] 07/04/2020 10:30 [name] Medical Record Number: [id number] Patient Account Number: [id number] Date of Birth/Sex: Treating RN: [birth year] (73 y.o. [name]) Carlene Coria Primary Care Gonzalo Waymire: [name] Other Clinician: Referring Jonai Weyland: Treating Lonie Rummell/Extender: [name] in Treatment: 0 Activities of Daily Living Items Answer Activities of Daily Living (Please select one for each item) Drive Automobile Completely Able T Medications ake Completely Able Use T elephone Completely Able Care for Appearance Completely Able Use T oilet Completely Able Bath / Shower Completely Able Dress Self Completely Able Feed Self Completely Able Walk Completely Able Get In / Out Bed Completely Able Housework Completely Able Prepare Meals Completely [place] for Self Completely Able Electronic Signature(s) Signed: 07/04/2020 6:06:03 PM By:  Carlene Coria RN Entered By: Carlene Coria on 07/04/2020 10:38:29 -------------------------------------------------------------------------------- Education Screening Details Patient Name: Date of Service: [name], [name] 07/04/2020 10:30 [name] Medical Record Number: [id number] Patient Account Number: [id number] Date of Birth/Sex: Treating RN: 1946/03/13 (73 y.o. [name]) Carlene Coria Primary Care Meleena Munroe: [name] Other Clinician: Referring Laquentin Loudermilk: Treating Aarthi Uyeno/Extender: [name] in Treatment: 0 Primary Learner Assessed: Patient Learning Preferences/Education Level/Primary Language Learning Preference: Explanation Highest Education Level: College or Above Preferred Language: English Cognitive Barrier Language Barrier: No Translator Needed: No Memory Deficit: No Emotional Barrier: No Cultural/Religious Beliefs Affecting Medical Care: No Physical Barrier Impaired Vision: No Impaired Hearing: No Decreased Hand dexterity: No Knowledge/Comprehension Knowledge Level: Medium Comprehension Level: High Ability to understand written instructions: High Ability to understand verbal instructions: High Motivation Anxiety Level: Anxious Cooperation: Cooperative Education Importance: Acknowledges Need Interest in Health Problems: Asks Questions Perception: Coherent Willingness to Engage in Self-Management High Activities: Readiness to Engage in Self-Management High Activities: Electronic Signature(s) Signed: 07/04/2020 6:06:03 PM By: Carlene Coria RN Entered By: Carlene Coria on 07/04/2020 10:39:04 -------------------------------------------------------------------------------- Fall Risk Assessment Details Patient Name: Date of Service: [name], [name] 07/04/2020 10:30 [name] Medical Record Number: [id number] Patient Account Number: [id number] Date of Birth/Sex: Treating RN: 1946-09-04 (73 y.o. [name]) Carlene Coria Primary Care  Vaiden Adames: [name] Other Clinician: Referring Yuvraj Pfeifer: Treating Jacquelina Hewins/Extender: [name] in Treatment: 0 Fall Risk Assessment Items Have you had 2 or more falls in the last 12 monthso 0 Yes Have you had any fall that resulted in injury in the last 12 monthso 0 Yes FALLS RISK SCREEN History of falling - immediate or within 3 months 25 Yes Secondary diagnosis (Do you have 2 or more medical diagnoseso) 15 Yes Ambulatory aid None/bed rest/wheelchair/nurse 0 No Crutches/cane/walker 0 No Furniture 0 No Intravenous therapy Access/Saline/Heparin Lock 0 No Gait/Transferring  Normal/ bed rest/ wheelchair 0 No Weak (short steps with or without shuffle, stooped but able to lift head while walking, may seek 0 No support from furniture) Impaired (short steps with shuffle, may have difficulty arising from chair, head down, impaired 0 No balance) Mental Status Oriented to own ability 0 No Electronic Signature(s) Signed: 07/04/2020 6:06:03 PM By: Carlene Coria RN Entered By: Carlene Coria on 07/04/2020 10:39:39 -------------------------------------------------------------------------------- Foot Assessment Details Patient Name: Date of Service: [name], [name] 07/04/2020 10:30 [name] Medical Record Number: [id number] Patient Account Number: [id number] Date of Birth/Sex: Treating RN: 1945-12-26 (73 y.o. [name]) Carlene Coria Primary Care Tamiki Kuba: [name] Other Clinician: Referring Danylle Ouk: Treating Josephina Melcher/Extender: [name] in Treatment: 0 Foot Assessment Items Site Locations + = Sensation present, - = Sensation absent, C = Callus, U = Ulcer R = Redness, W = Warmth, [name] = Maceration, PU = Pre-ulcerative lesion F = Fissure, S = Swelling, D = Dryness Assessment Right: Left: Other Deformity: No No Prior Foot Ulcer: No No Prior Amputation: No No Charcot Joint: No No Ambulatory Status: Ambulatory  Without Help Gait: Steady Electronic Signature(s) Signed: 07/04/2020 6:06:03 PM By: Carlene Coria RN Entered By: Carlene Coria on 07/04/2020 10:42:20 -------------------------------------------------------------------------------- Nutrition Risk Screening Details Patient Name: Date of Service: [name], [name] 07/04/2020 10:30 [name] Medical Record Number: [id number] Patient Account Number: [id number] Date of Birth/Sex: Treating RN: 1946-07-29 (73 y.o. [name]) Carlene Coria Primary Care Stewart Sasaki: [name] Other Clinician: Referring Malick Netz: Treating Shadee Rathod/Extender: [name] in Treatment: 0 Height (in): 68 Weight (lbs): 192 Body Mass Index (BMI): 29.2 Nutrition Risk Screening Items Score Screening NUTRITION RISK SCREEN: I have an illness or condition that made me change the kind and/or amount of food I eat 0 No I eat fewer than two meals per day 0 No I eat few fruits and vegetables, or milk products 0 No I have three or more drinks of beer, liquor or wine almost every day 0 No I have tooth or mouth problems that make it hard for me to eat 0 No I don't always have enough money to buy the food I need 0 No I eat alone most of the time 0 No I take three or more different prescribed or over-the-counter drugs a day 1 Yes Without wanting to, I have lost or gained 10 pounds in the last six months 0 No I am not always physically able to shop, cook and/or feed myself 2 Yes Nutrition Protocols Good Risk Protocol Moderate Risk Protocol 0 Provide education on nutrition High Risk Proctocol Risk Level: Moderate Risk Score: 3 Electronic Signature(s) Signed: 07/04/2020 6:06:03 PM By: Carlene Coria RN Entered By: Carlene Coria on 07/04/2020 10:39:50

## 2020-07-04 NOTE — Progress Notes (Signed)
[name], [name] ([id number]) Visit Report for 07/04/2020 Chief Complaint Document Details Patient Name: Date of Service: [name], [name] 07/04/2020 10:30 [name] Medical Record Number: [id number] Patient Account Number: [id number] Date of Birth/Sex: Treating RN: August 18, 1946 (74 y.o. [name] Primary Care Provider: [name] Other Clinician: Referring Provider: Treating Provider/Extender: [name] in Treatment: 0 Information Obtained from: Patient Chief Complaint 08/06/2019; patient returns to clinic with a fairly substantial wound on the right posterior calf 04/29/2020-Patient comes into the clinic today with right index finger swelling, pain, drainage with a small surgical open area 07/04/2020 patient is here with wounds on his left greater than right lower extremity and also both arms as well as the left fifth toe Electronic Signature(s) Signed: 07/04/2020 6:16:54 PM By: [name] MD Entered By: [name] on 07/04/2020 12:10:10 -------------------------------------------------------------------------------- Debridement Details Patient Name: Date of Service: [name], [name] 07/04/2020 10:30 [name] Medical Record Number: [id number] Patient Account Number: [id number] Date of Birth/Sex: Treating RN: [birth year] (74 y.o. [name] Primary Care Provider: [name] Other Clinician: Referring Provider: Treating Provider/Extender: [name] in Treatment: 0 Debridement Performed for Assessment: Wound #12 Left,Lateral Lower Leg Performed By: Physician Ricard Dillon., MD Debridement Type: Debridement Severity of Tissue Pre Debridement: Fat layer exposed Level of Consciousness (Pre-procedure): Awake and Alert Pre-procedure Verification/Time Out Yes - 11:20 Taken: Start Time: 11:22 Pain Control: Lidocaine 4% T opical Solution T Area Debrided (L x W): otal 2.2 (cm) x 2  (cm) = 4.4 (cm) Tissue and other material debrided: Viable, Non-Viable, Slough, Subcutaneous, Skin: Epidermis, Slough Level: Skin/Subcutaneous Tissue Debridement Description: Excisional Instrument: Curette Bleeding: Minimum Hemostasis Achieved: Pressure Procedural Pain: 5 Post Procedural Pain: 4 Response to Treatment: Procedure was tolerated well Level of Consciousness (Post- Awake and Alert procedure): Post Debridement Measurements of Total Wound Length: (cm) 2.2 Width: (cm) 2 Depth: (cm) 0.4 Volume: (cm) 1.382 Character of Wound/Ulcer Post Debridement: Improved Severity of Tissue Post Debridement: Fat layer exposed Post Procedure Diagnosis Same as Pre-procedure Electronic Signature(s) Signed: 07/04/2020 6:16:54 PM By: [name] MD Signed: 07/04/2020 6:20:53 PM By: [name] RN, BSN Entered By: [name] on 07/04/2020 12:09:13 -------------------------------------------------------------------------------- Debridement Details Patient Name: Date of Service: [name], [name] 07/04/2020 10:30 [name] Medical Record Number: [id number] Patient Account Number: [id number] Date of Birth/Sex: Treating RN: 03-15-1946 (75 y.o. [name] Primary Care Provider: [name] Other Clinician: Referring Provider: Treating Provider/Extender: [name] in Treatment: 0 Debridement Performed for Assessment: Wound #13 Left T Fifth oe Performed By: Physician Ricard Dillon., MD Debridement Type: Debridement Severity of Tissue Pre Debridement: Fat layer exposed Level of Consciousness (Pre-procedure): Awake and Alert Pre-procedure Verification/Time Out Yes - 11:20 Taken: Start Time: 11:22 Pain Control: Lidocaine 4% T opical Solution T Area Debrided (L x W): otal 1.2 (cm) x 0.6 (cm) = 0.72 (cm) Tissue and other material debrided: Viable, Non-Viable, Eschar, Slough, Subcutaneous, Slough Level: Skin/Subcutaneous  Tissue Debridement Description: Excisional Instrument: Curette Bleeding: Minimum Hemostasis Achieved: Pressure End Time: 11:26 Procedural Pain: 5 Post Procedural Pain: 4 Response to Treatment: Procedure was tolerated well Level of Consciousness (Post- Awake and Alert procedure): Post Debridement Measurements of Total Wound Length: (cm) 1.2 Width: (cm) 0.6 Depth: (cm) 0.1 Volume: (cm) 0.057 Character of Wound/Ulcer Post Debridement: Improved Severity of Tissue Post Debridement: Fat layer exposed Post Procedure Diagnosis Same as Pre-procedure Electronic Signature(s) Signed: 07/04/2020 6:16:54 PM  By: [name] MD Signed: 07/04/2020 6:20:53 PM By: [name] RN, BSN Entered By: [name] on 07/04/2020 12:09:24 -------------------------------------------------------------------------------- HPI Details Patient Name: Date of Service: [name], [name] 07/04/2020 10:30 [name] Medical Record Number: [id number] Patient Account Number: [id number] Date of Birth/Sex: Treating RN: [birth year] (74 y.o. [name] Primary Care Provider: [name] Other Clinician: Referring Provider: Treating Provider/Extender: [name] in Treatment: 0 History of Present Illness HPI Description: [name] HPI Description: 75 year old gentleman who was recently seen by his nephrologist [name], and noted to have a wound on his left lower extremity which was lacerated 2 months ago and now has reopened. The patient's left shin has a ulceration with some exudate but no evidence of infection and he was referred to [place] for further care as it was known that the patient has had some peripheral vascular disease in the past. Past medical history significant for chronic kidney disease, atrial fibrillation, diabetes mellitus,status post kidney transplant in 1983 and 2005, a week fistula graft placement, status post previous bowel  surgery. 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 10/06/2015 -- x-ray of the left tibia and fibula shows no evidence of osteomyelitis. The patient has also had Doppler studies of his extremity and is awaiting the appointment with the vascular surgeon. We have not yet received these reports. 10/13/2015 -- lower extremity venous duplex reflux evaluation shows reflux in the left common femoral vein, left saphenofemoral junction and the proximal greater saphenous vein extending to the proximal calf. There is also reflux in the left proximal to mid small saphenous vein. Arterial duplex studies done showed the resting ABI was not applicable due to tibial artery medial calcification. 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 The right TBI was 1 which is normal and the left ABI was 1 which is normal. Patient has otherwise been doing fine and has been compliant with his dressings. 10/20/2015 -- He was seen by [name] recently for a vascular opinion on 10/15/2015. His left lower extremity venous insufficiency duplex study revealed GSV reflux,SS vein reflux and deep venous reflux in the common femoral vein. His ABIs were non compressible and his TBI on the right was 1.01 and on the left was 0.80. He was asked to continue with the wound care with compressive therapy followed by EVLA of the left GS vein 3 months. He recommended 20-30 mm thigh- high compression stockings and the need for a three-month trial of this. The patient had an Unna boot applied at the vascular office but he could not tolerate this with a lot of pain and issues with his toes and hence came here on Friday for removal of this and we reapplied a 2 layer compression. 11/10/2015 -- patient still has not purchased his 20-30 mm thigh-high compression stockings as prescribed by Dr.  [name]. Readmission: 08/08/18 on evaluation today patient presents for readmission concerning a new injury to the left anterior lower extremity. He was previously seen in 2017 here in our clinic. He states that he has done fairly well since that point. Nonetheless he is having at this time some pain but states that he hit this on a table that fell over and actually struck his leg. This appears to have pulled back some of his skin which folded in  on itself and is causing some difficulty as far as that is concerned. There does not appear to be any evidence of infection at this time. No fevers, chills, nausea, or vomiting noted at this time. He's been using dressings on his own currently without complication. 08/15/18 on evaluation today patient actually appears to be doing somewhat better in regard to his wound of the lower [name] when compared to the first visit last week. I had to do a much more extensive debridement at that time it does appear that I'[name] gonna have to perform some debridement today but it does not look to be as extensive by any means. Nonetheless fortunately he does not show any signs of infection he does have discomfort at this site. I believe based on what I'[name] seeing currently he may benefit from Iodoflex to help keep the wound bed clean. Patient tolerated therapy without complication. Upon evaluation today the patient actually appears to be doing excellent in regard to his left lower extremity ulcer. This is much better than the previous two visits where he had a lot of necrotic tissue around the edge of the wound simply due to the fact that again there was a significant skin tear where the edge had been cleared away prior to reattaching and being able to heal appropriately. He seems to be doing much better at this point. 08/28/18 on evaluation today the patient's wound actually does appear to be showing signs of improvement. With that being said though he is improving he would  likely note even greater improvement if we were able to sharply debride the wound. Nonetheless this caused him to much discomfort he tells me. 09/04/18 on evaluation today patient actually appears to be showing signs of improvement in regard to his left lower extremity ulcer. He has been tolerating the dressing changes including the wrap although he tells me at this point that the burning does last for a couple of days even with just the Iodoflex. I was afraid that this may been part of the issue that he was having with discomfort. It does seem to be the case. Nonetheless he shows no signs of evidence of infection at this time which is good news. No fevers chills noted ADMISSION to [name] 10/05/2018 This is a patient who was cared for in 2017 and again in the fall of this year at our sister clinic and [place]. He actually lives in [place] in [place]. We have been dealing with an apparent traumatic area on the left anterior tibial area. This is been present for the last several months. He was supposed to be using Iodoflex Kerlix and Coban however he was hospitalized from 09/05/2018 through 09/11/2018 with delirium secondary to pneumonia. Since then he is only been putting Vaseline gauze on this without compression. He also has a more recent skin tear on the dorsal right hand that may have only happened in the last week. The patient had arterial studies done in 2017 in January which was 3 years ago. At that point he had noncompressible ABIs but really quite good TBI's both normal. Triphasic waveforms on the right monophasic at the left posterior tibial but triphasic at the left dorsalis pedis. His ABIs in our clinic today were both noncompressible 1/23; the patient has wounds on the right dorsal hand just distal to the wrist and on the left anterior lower extremity. Both of these look very healthy he is using Hydrofera Blue 1/30; left anterior lower extremity wound much  smaller. Healthy looking surface. The laceration area just distal to the wrist on the dorsal hand on the right is also just about closed I used Hydrofera Blue here 2/6; left anterior lower extremity wound is much smaller but still open. The laceration area just distal to the wrist on the dorsal hand is fully epithelialized. 2/13; the patient's anterior lower extremity wound is closed. The laceration just distal to the wrist on the dorsal hand is also fully epithelialized and closed. The patient has external compression stockings which I think are 20/30 READMISSION 08/06/2019 Mr. Faniel is a 74 year old man with had several times previously in our clinic. He is a diabetic with a history of chronic renal insufficiency status post kidney transplant in 1983 and again in 2005. He was then in 2017 with a laceration on the left lower extremity. He was worked up at the time with arterial studies and reflux studies. The arterial studies showed ABIs to be noncompressible but TBI's were within normal limits. I do not have the reflux studies at the moment. He was also sent here in 2019 with a left lower extremity wound and then again in 2020 with left lower extremity trauma a skin tear on the wrist. He was discharged with 20/30 stockings identified from myself that that might not be enough compression. Nevertheless he states he was wearing these fairly reliably. In September he had a fall with a substantial bruise in the area of the wound. He says he saw orthopedics and they told him there was some muscle strain sometime it later this opened into a wound. He has a fairly substantial wound on the right posterior calf. Satellite areas around this including medially and posteriorly. He has not worn his stockings since the injury Past medical history; includes chronic renal failure secondary to diabetes with kidney transplant x2, atrial fibrillation, heart failure with preserved ejection fraction, coronary artery  disease. ABIs on the right in our clinic were once again noncompressible 08/13/2019 on evaluation today patient appears to be doing decently well with regard to his wound compared to last week's evaluation. Unfortunately he is still having a lot of discomfort at this point which is I think in some part due to the 3 layer compression wrap which is a little bit stronger I think for him. When he was here before we actually utilized a Kerlix and Coban wrap which he states seemed to got a little bit better. Nonetheless I think we can probably drop back to this in light of the discomfort that he had. Nonetheless the pain was not really right around the wound itself as much as it was around the ankle in particular. The Iodoflex does seem to have done well for him As the wound is appearing somewhat better today which is excellent news. 11/30; still complaining of a lot of pain. Apparently arterial studies I ordered 2 weeks ago are below. I do not believe we have an appointment with vein and vascular as of yet; ABI Findings: +---------+------------------+-----+----------+--------+ Right Rt Pressure (mmHg)IndexWaveform Comment  +---------+------------------+-----+----------+--------+ PTA >254 1.50 monophasic  +---------+------------------+-----+----------+--------+ DP >254 1.50 monophasic  +---------+------------------+-----+----------+--------+ Great T [id number] 0.40 Abnormal   +---------+------------------+-----+----------+--------+ +---------+------------------+-----+----------+-------+ Left Lt Pressure (mmHg)IndexWaveform Comment +---------+------------------+-----+----------+-------+ Brachial 169     +---------+------------------+-----+----------+-------+ PTA >254 1.50 monophasic  +---------+------------------+-----+----------+-------+ DP >254 1.50 monophasic  +---------+------------------+-----+----------+-------+ Great T oe40 0.24 Abnormal    +---------+------------------+-----+----------+-------+ Pedal arteries appear hyperemic. Patient refused Brachial pressure in the right arm. Summary: Right: Resting right ankle-brachial index indicates noncompressible right lower extremity arteries.  The right toe-brachial index is abnormal. Left: Resting left ankle-brachial index indicates noncompressible left lower extremity arteries. The left toe-brachial index is abnormal. He is also having considerably more swelling in his left calf. This was not there when I last saw him 2 weeks ago. He tells me that some of the home health compression wraps have been slipping down and that may be the issue here however a month I am uncertain 35/4; sees vascular on December 22. Still complaining of a lot of pain. DVT study I did last time was negative for DVT 12/14; still complaining of pain which if this is arterial is certainly claudication and rest keeps him uncomfortable at night. He has an appointment with vein and vascular on December 22. Wound surface is better using Iodoflex. Once the surface of this is satisfactory and we have exhausted the vascular route. Perhaps an advanced treatment option. He has a configuration of the venous ulceration although his arterial studies are not very good. The other issue is the patient has a transplanted kidney. This will make angiography difficult and challenging issue 12/21; still complaining of pain and drainage. We are using Iodoflex on the wound under compression. He sees Dr. Donnetta Hutching tomorrow to evaluate his noninvasive studies noted above. He has a transplanted kidney further complicating the options for angiography. 12/28; still using Iodoflex under compression. I have Dr. Luther Parody note from 12/22. he noted arterial studies revealing monophasic waveforms at the pedal vessels bilaterally and calcified vessels making the ABI unreliable. He did not comment on the reduced TBI's. He felt these were venous wounds based  on the palpable dorsalis pedis pulse. 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 And they arranged for formal venous duplex with reflux studies in the next several weeks. Follow-up with either Scot Dock or Dr. Oneida Alar 09/24/2019 still using Iodoflex under compression. He has an appointment with Dr. Doren Custard on 1/7 with regards to his venous disease. The patient was not felt to have a primary arterial problem for the nonhealing of his wound. We did attempt to wrap him with 3 layer when he first came into the clinic he complained of a lot of pain in the ankle although this may have been because the dressing fell down somewhat. He has far too edema fluid in the right leg for a good prognosis about healing this wound He comes in today with an excoriation on the bottom part of his right fifth toe. He thinks he may have done this taking off his clothes and that something got caught on the toe. There is no open wound however the toe itself is very painful 1/11; we are using Iodoflex under compression. The wound bed is clean. He went on to see Dr. Doren Custard on 09/27/2019 he again feels that the patient's arterial supply is adequate. He feels that he might benefit from right greater saphenous vein ablation for a venous ulcer. In the meantime the area that he was complaining about last week on the right fifth toe. An x-ray that I ordered showed marked soft tissue swelling along the distal aspect of the right foot but there was no evidence of osteomyelitis. He comes in today with the fifth toenail just about coming off. He has black eschar underneath the toe on the plantar aspect. The toe was swollen red and very painful. In the right setting this could be a significant soft tissue infection versus ischemia of the toe itself. He did not show this to Dr. Doren Custard 1/18; I am using  Iodoflex under compression a large wound on the right posterior calf. He had his right greater saphenous vein ablation by Dr. Doren Custard although I am  not sure that is the only problem here. The right fifth toe which was possibly trauma 2 weeks ago continues to be exceptionally painful with a necrotic tip. Maybe not quite as swollen. I started him empirically on Augmentin 5/125 1 p.o. twice daily last week after discussing this with Dr. Loletha Grayer of nephrology. Perhaps somewhat better this week but not as good as I was hoping. A plain x-ray was negative. He comes in today with an area on the medial right calf that was blistered and now open. In looking at things he appears to be systemically fluid volume overloaded. He has a transplanted kidney. He states he takes his Lasix variably when he has appointments he tries not to take it the later I am really not certain if he takes this reliably. However he has far too much edema fluid in the right leg to easily heal this wound and he appears to be developing blisters medially to form additional wounds 1/25; his CO2 angiogram was done by Dr. Doren Custard last week. This showed the proximal arteries all to be patent. On the right side the common femoral deep femoral superficial femoral popliteal anterior tibial and peroneal arteries were patent. The posterior tibial was occluded but reconstituted distally via collaterals from the peroneal artery he was felt he should have enough blood flow to heal his wounds including the right fifth toe. The right fifth toe looks better however he is still complaining of a lot of pain. The large area which is a venous ulceration posteriorly has a better looking surface I think we can switch to Hydrofera Blue today 2/1; the patient's original wounds on the right posterior medial calf has come down in width however superior to this he has new denuded areas and I am concerned we simply do not have enough edema control. He has already undergone a right greater saphenous ablation. He had a CO2 angiogram done by Dr. Doren Custard and the comment was that we should have enough blood flow to heal the  venous wound however we simply do not seem to have enough edema control with 3 layer compression. The patient is status post kidney transplant although his exact renal function is not really clear. Nor am i sure what dose of diuretic he is supposed to be on. He also has the area on the right fifth toe which was unclear etiology but I think became secondarily infected I gave him 2 weeks of antibiotics for this and this seems to have settled down he still has a black eschar over the tip of the toe. X-ray was negative for fracture. He says he has a history of gout. 2/8; the patient's wound on the right posterior calf is about the same. The superficial area medially also about the same. He got a prescription for prednisoneo Gout after he developed erythema on the dorsal aspect of his left great toe going along with the right fifth toe which has been problematic all along. He has not taken it because he is concerned about increasing CBGs. He has a transplanted kidney is already on prednisone 5 mg. He would not be a good candidate for NSAIDs. Perhaps colchicine. He is not aware of what his uric acid level is 2/15; his right posterior calf wound seems to be coming in in terms of width. Everything here looks fairly good. No mechanical  debridement we have been using Hydrofera Blue. He has had an ablation by vein and vascular. Felt to have adequate arterial supply to this area. The patient got prednisone last week from Dr. Angelique Holm of nephrology. At my urging he actually took it. The area on his left toe was a lot better. The right toe was not as painful but still erythematous with a wound at the tip. We have been using silver alginate here 2/22; right posterior calf seems to be gradually epithelializing. Still a fairly substantial wound. He still has an area on the tip of his right fifth toe which I think was gout related. This is gradually improving. We have been using Unna boots to wrap X-ray I did of the  foot last time was again negative there was soft tissue irregularity about the distal fifth digit but no radiographic evidence of bone damage 3/1; right posterior calf seems to be gradually improving however there were areas of hyper granulation. We have been using Hydrofera Blue. The hyper granulation is mostly evident in the most superior finger shape projection of the wound itself. The area on the right fifth toe also was slough covered and required debridement. 3/8 continued improvement in the right posterior calf and the tip of the right fifth toe. We have been using Hydrofera Blue under compression he is changing the area to the toe 3/22; continued improvement in the right posterior calf. Unfortunately comes in today with a new skin tear in the mid anterior tibia area this probably had something to do with changing his dressing home health I called and left this a message last week. 3/29; continued improvement in the a substantial wound on the right posterior calf. The skin tear anteriorly from last week is already healed on the tip of the right fifth toe there is still a nonviable area. 4/5; we have continued contraction of the substantial wound on the right posterior calf. We continue to have problems with the tip of the right fifth toe. We have been using Hydrofera Blue to both wound areas He is complaining about some discomfort in the Achilles area. T me he had surgery for what sounds like a torn Achilles about 10 years ago. ells 4/12; we have continued contraction of the substantial wound on the right posterior calf. Still an area on the tip of the right fifth toe. I changed him to silver collagen on the toe last week but I think home health continue to use Hydrofera Blue to both wound areas. 4/19; we continue to have contraction of the substantial wound on the right posterior calf however there continues to be hyper granulation. The area on the tip of the fifth toe is very small still  some debris on the surface we have been using silver collagen 4/26; both wounds have contracted. I was hoping the fifth toe wound would close over but with probing there is still an open small hole here. 5/3; his wound on the right posterior calf which was his major area continues to contract looks healthy we have been using Hydrofera Blue. Using silver collagen to the fifth toe, not making a lot of progress here 5/10; right posterior calf wound continues to be smaller in terms of surface area and look healthy we have been using Hydrofera Blue here. The tip of the right fifth toe is still open requiring debridement. He has a new laceration on the right forearm he says he hit this on a microwave 6 days ago 02/11/20-The right fifth  toe wound is closed, the right posterior calf wound looks healthy with smaller dimensions compared with last time, the right forearm area of laceration has a small blister which I try to open up with scalpel with very little fluid 6/7 the right fifth toe looks fine. Area on the right forearm is also closed. Right posterior calf wound continues to contract. Much smaller area remaining with healthy surface. We are using Hydrofera Blue under compression 6/21; area on the right posterior calf. There is only 1 small area in the most distal part of this. We have been using Hydrofera Blue under compression this is contracting surface looks healthy 6/28; right posterior calf this is healed. This was initially a hematoma. During the stay in this clinic he had a right greater saphenous vein ablation by Dr. Doren Custard of vein and vascular. He also had a difficult time with his right fifth toeo Infection versus gout. Very difficult time with this so that this has been closed for about 6 weeks. ------------------- READMISSION 04/29/20-Patient returns to the clinic this time with a right index finger swelling and pain been drainage, patient has been to the hand surgeon and vascular surgeon and  ended up having a small IandD attempted in the office with expression of some gritty whitish material, patient also hit his hand and developed a small hematoma around this area. He also had vascular Dopplers done that show monophasic right radial right ulnar waveforms. He has follow-up appointments with vascular surgery as well as the hand surgeon. His appointment today with a hand surgeon he was told that he may lose the tip of his index finger if surgery is done to open up the entire area. He is reluctant to have any surgery and would like to try conservative measures He has tried epsom salt soaks with improvement then worsening , he has just finished a course of antibiotics, he does not remember the name 8/16; patient was readmitted to the clinic last week and I am seeing this for the first time. Previously we had looked at this man for a large wound in the right posterior calf that was initially a hematoma. He went on to have a greater saphenous vein ablation. During the stay here he also had a difficult time with an area over his right fifth toe question infectious question gout at that time. This eventually closed over although I do not think the differential was ever really totally clear in my mind. The patient is here this time with a necrotic area on the palmar surface of his right index finger. He states this started in July with a small red area. He spent some time going to see Dr. Jarome Matin of dermatology. Ended up with Dr. 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 He is also seen hand surgery which is fortunate is he is likely to require either debridement or amputation of this finger. He had a course of antibiotics Keflex and doxycycline I believe but he is finished these. Currently using Betadine. He describes this is very painful. I have not had a chance to look back over notes in care everywhere. I would like to see as hand surgery  notes, vascular surgery notes and dermatology notes if this is possible. He also apparently had an x-ray ordered by Dr. Amedeo Plenty that I would like to review. 5/42; certainly no improvement in his right index finger palmar aspect. Culture I did last time showed abundant Enterobacter and abundant Proteus.  I have him on cefdinir 300 mg every 24 as of Friday in response to this. This was a deep tissue culture. 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 He still complaining of a lot of pain. READMISSION 07/04/2020 Mr. Hallenbeck is a 74 year old man with wounds on his bilateral lower extremities and bilateral upper extremities which were apparently the consequence of being found down at home in early September. He required admission to the hospital from 9/6 through 9/17 with delirium, hyponatremia, sepsis and type 2 diabetes. He gradually improved. He was sent to a nursing home in [place] and recently has been discharged home. They are using some foam-based dressings on his bilateral lower and upper extremity wounds as well as his left fifth toe which apparently are felt to be secondary from the prolonged friction and pressure of being recumbent. We have had Mr. Helget in the clinic on 2 different occasions. Predominantly the first time for a large wound on the right posterior calf related to trauma and chronic venous insufficiency. He was here in August with a necrotic wound on the tip of his right index finger which ultimately required amputation this is still currently wrapped. 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 Vanita Panda of nephrology He has not been previously felt to have an arterial  issue previous ABIs were noncompressible Electronic Signature(s) Signed: 07/04/2020 6:16:54 PM By: [name] MD Entered By: [name] on 07/04/2020 12:21:52 -------------------------------------------------------------------------------- Physical Exam Details Patient Name: Date of Service: [name], [name] 07/04/2020 10:30 [name] Medical Record Number: [id number] Patient Account Number: [id number] Date of Birth/Sex: Treating RN: 06-07-1946 (74 y.o. [name] Primary Care Provider: [name] Other Clinician: Referring Provider: Treating Provider/Extender: [name] in Treatment: 0 Constitutional Sitting or standing Blood Pressure is within target range for patient.. Pulse regular and within target range for patient.Marland Kitchen Respirations regular, non-labored and within target range.. Temperature is normal and within the target range for the patient.Marland Kitchen Appears in no distress. Respiratory work of breathing is normal. Bilateral breath sounds are clear and equal in all lobes with no wheezes, rales or rhonchi.. Cardiovascular Heart sounds are normal there is no gallops however his JVP is easily elevated he has 2-3+ sacral edema. Pedal pulses are palpable. 2-3+ pitting edema in his bilateral lower legs nothing looks acute here.. Integumentary (Hair, Skin) Skin changes of chronic steroid use. Notes Wound exam The major area here is on the left lateral calf. This does not have a viable surface I debrided with a #3 curette I still do not think were able to get anything here that looks viable. He also had necrotic debris over an area on the dorsal aspect of his left fifth toe also debrided with a #3 curette He has weeping edema coming out of the lateral part of his right calf some erythema here but no tenderness He has small superficial areas on his bilateral forearms Electronic Signature(s) Signed: 07/04/2020 6:16:54 PM By: [name]  MD Entered By: [name] on 07/04/2020 12:24:13 -------------------------------------------------------------------------------- Physician Orders Details Patient Name: Date of Service: DWAIN, HUHN HA [name] 07/04/2020 10:30 [name] Medical Record Number: [id number] Patient Account Number: [id number] Date of Birth/Sex: Treating RN: 1946-07-21 (73 y.o.  [name] Primary Care Provider: [name] Other Clinician: Referring Provider: Treating Provider/Extender: [name] in Treatment: 0 Verbal / Phone Orders: No Diagnosis Coding Follow-up Appointments Return Appointment in 1 week. Dressing Change Frequency Other: - 2 times per week (once at wound center, once at wound center) all wounds Skin Barriers/Peri-Wound Care Moisturizing lotion - to both legs with dressing changes Wound Cleansing May shower with protection. May shower and wash wound with soap and water. - on days when dressings are changed Primary Wound Dressing Wound #15 Left Elbow Xeroform Wound #16 Left,Anterior Forearm Xeroform Wound #17 Right Forearm Xeroform Wound #12 Left,Lateral Lower Leg Calcium Alginate with Silver Wound #13 Left T Fifth oe Calcium Alginate with Silver Wound #14 Right,Lateral Lower Leg Calcium Alginate with Silver Secondary Dressing Wound #15 Left Elbow Foam Border Wound #17 Right Forearm Foam Border Wound #12 Left,Lateral Lower Leg Dry Gauze Wound #13 Left T Fifth oe Dry Gauze Wound #14 Right,Lateral Lower Leg Dry Gauze Edema Control 3 Layer Compression System - Bilateral Avoid standing for long periods of time Elevate legs to the level of the heart or above for 30 minutes daily and/or when sitting, a frequency of: - whenever sitting throughout the day Exercise regularly [facility] for wound care. - [facility] Patient Medications llergies: Elavil A Notifications Medication Indication  Start End prior to debridement 07/04/2020 lidocaine DOSE topical 4 % cream - cream topical Electronic Signature(s) Signed: 07/04/2020 6:16:54 PM By: [name] MD Signed: 07/04/2020 6:20:53 PM By: [name] RN, BSN Entered By: [name] on 07/04/2020 11:33:40 -------------------------------------------------------------------------------- Problem List Details Patient Name: Date of Service: [name], [name] 07/04/2020 10:30 [name] Medical Record Number: [id number] Patient Account Number: [id number] Date of Birth/Sex: Treating RN: 1946-03-18 (74 y.o. [name], [name] Primary Care Provider: [name] Other Clinician: Referring Provider: Treating Provider/Extender: [name] in Treatment: 0 Active Problems ICD-10 Encounter Code Description Active Date MDM Diagnosis 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er exposed 07/04/2020 No Yes 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 of 07/04/2020 No Yes skin L97.211 Non-pressure chronic ulcer of right calf limited to breakdown of skin 07/04/2020 No Yes S40.812D Abrasion of left upper arm, subsequent encounter 07/04/2020 No Yes S40.811D Abrasion of right upper arm, subsequent encounter 07/04/2020 No Yes E11.622 Type 2 diabetes mellitus with other skin ulcer 07/04/2020 No Yes I87.323 Chronic venous hypertension (idiopathic) with inflammation of bilateral lower 07/04/2020 No Yes extremity Inactive Problems Resolved Problems Electronic Signature(s) Signed: 07/04/2020 6:16:54 PM By: [name] MD Entered By: [name] on 07/04/2020 12:05:44 -------------------------------------------------------------------------------- Progress Note Details Patient Name: Date of Service: [name] 07/04/2020 10:30 [name] Medical Record Number: [id number] Patient Account Number: [id number] Date of  Birth/Sex: Treating RN: Sep 21, 1945 (74 y.o. [name] Primary Care Provider: [name] Other Clinician: Referring Provider: Treating Provider/Extender: [name] in Treatment: 0 Subjective Chief Complaint Information obtained from Patient 08/06/2019; patient returns to clinic with a fairly substantial wound on the right posterior calf 04/29/2020-Patient comes into the clinic today with right index finger swelling, pain, drainage with a small surgical open area 07/04/2020 patient is here with wounds on his left greater than right lower extremity and also both arms as well as the left fifth toe History of Present Illness (HPI) [place] HPI Description: 74 year old gentleman who was recently  seen by his nephrologist [name], and noted to have a wound on his left lower extremity which was lacerated 2 months ago and now has reopened. The patient's left shin has a ulceration with some exudate but no evidence of infection and he was referred to [place] for further care as it was known that the patient has had some peripheral vascular disease in the past. Past medical history significant for chronic kidney disease, atrial fibrillation, diabetes mellitus,status post kidney transplant in 1983 and 2005, a week fistula graft placement, status post previous bowel surgery. 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 10/06/2015 -- x-ray of the left tibia and fibula shows no evidence of osteomyelitis. The patient has also had Doppler studies of his extremity and is awaiting the appointment with the vascular surgeon. We have not yet received these reports. 10/13/2015 -- lower extremity venous duplex reflux evaluation shows reflux in the left common femoral vein, left saphenofemoral junction and the proximal greater saphenous vein extending to the proximal calf. There is also reflux in the left proximal to mid small  saphenous vein. Arterial duplex studies done showed the resting ABI was not applicable due to tibial artery medial calcification. 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 The right TBI was 1 which is normal and the left ABI was 1 which is normal. Patient has otherwise been doing fine and has been compliant with his dressings. 10/20/2015 -- He was seen by [name] recently for a vascular opinion on 10/15/2015. His left lower extremity venous insufficiency duplex study revealed GSV reflux,SS vein reflux and deep venous reflux in the common femoral vein. His ABIs were non compressible and his TBI on the right was 1.01 and on the left was 0.80. He was asked to continue with the wound care with compressive therapy followed by EVLA of the left GS vein 3 months. He recommended 20-30 mm thigh- high compression stockings and the need for a three-month trial of this. The patient had an Unna boot applied at the vascular office but he could not tolerate this with a lot of pain and issues with his toes and hence came here on Friday for removal of this and we reapplied a 2 layer compression. 11/10/2015 -- patient still has not purchased his 20-30 mm thigh-high compression stockings as prescribed by [name]. Readmission: 08/08/18 on evaluation today patient presents for readmission concerning a new injury to the left anterior lower extremity. He was previously seen in 2017 here in our clinic. He states that he has done fairly well since that point. Nonetheless he is having at this time some pain but states that he hit this on a table that fell over and actually struck his leg. This appears to have pulled back some of his skin which folded in on itself and is causing some difficulty as far as that is concerned. There does not appear to be any evidence of infection at this time. No  fevers, chills, nausea, or vomiting noted at this time. He's been using dressings on his own currently without complication. 08/15/18 on evaluation today patient actually appears to be doing somewhat better in regard to his wound of the lower [name] when compared to the first visit last week. I had to do a much more extensive debridement at that time it does appear that  I'[name] gonna have to perform some debridement today but it does not look to be as extensive by any means. Nonetheless fortunately he does not show any signs of infection he does have discomfort at this site. I believe based on what I'[name] seeing currently he may benefit from Iodoflex to help keep the wound bed clean. Patient tolerated therapy without complication. Upon evaluation today the patient actually appears to be doing excellent in regard to his left lower extremity ulcer. This is much better than the previous two visits where he had a lot of necrotic tissue around the edge of the wound simply due to the fact that again there was a significant skin tear where the edge had been cleared away prior to reattaching and being able to heal appropriately. He seems to be doing much better at this point. 08/28/18 on evaluation today the patient's wound actually does appear to be showing signs of improvement. With that being said though he is improving he would likely note even greater improvement if we were able to sharply debride the wound. Nonetheless this caused him to much discomfort he tells me. 09/04/18 on evaluation today patient actually appears to be showing signs of improvement in regard to his left lower extremity ulcer. He has been tolerating the dressing changes including the wrap although he tells me at this point that the burning does last for a couple of days even with just the Iodoflex. I was afraid that this may been part of the issue that he was having with discomfort. It does seem to be the case. Nonetheless he shows no signs  of evidence of infection at this time which is good news. No fevers chills noted ADMISSION to [name] 10/05/2018 This is a patient who was cared for in 2017 and again in the fall of this year at our sister clinic and [place]. He actually lives in [place] in [place]. We have been dealing with an apparent traumatic area on the left anterior tibial area. This is been present for the last several months. He was supposed to be using Iodoflex Kerlix and Coban however he was hospitalized from 09/05/2018 through 09/11/2018 with delirium secondary to pneumonia. Since then he is only been putting Vaseline gauze on this without compression. He also has a more recent skin tear on the dorsal right hand that may have only happened in the last week. The patient had arterial studies done in 2017 in January which was 3 years ago. At that point he had noncompressible ABIs but really quite good TBI's both normal. Triphasic waveforms on the right monophasic at the left posterior tibial but triphasic at the left dorsalis pedis. His ABIs in our clinic today were both noncompressible 1/23; the patient has wounds on the right dorsal hand just distal to the wrist and on the left anterior lower extremity. Both of these look very healthy he is using Hydrofera Blue 1/30; left anterior lower extremity wound much smaller. Healthy looking surface. The laceration area just distal to the wrist on the dorsal hand on the right is also just about closed I used Hydrofera Blue here 2/6; left anterior lower extremity wound is much smaller but still open. The laceration area just distal to the wrist on the dorsal hand is fully epithelialized. 2/13; the patient's anterior lower extremity wound is closed. The laceration just distal to the wrist on the dorsal hand is also fully epithelialized and closed. The patient has external compression stockings which I think are  20/30 READMISSION 08/06/2019 Mr. Spagnoli is a  74 year old man with had several times previously in our clinic. He is a diabetic with a history of chronic renal insufficiency status post kidney transplant in 1983 and again in 2005. He was then in 2017 with a laceration on the left lower extremity. He was worked up at the time with arterial studies and reflux studies. The arterial studies showed ABIs to be noncompressible but TBI's were within normal limits. I do not have the reflux studies at the moment. He was also sent here in 2019 with a left lower extremity wound and then again in 2020 with left lower extremity trauma a skin tear on the wrist. He was discharged with 20/30 stockings identified from myself that that might not be enough compression. Nevertheless he states he was wearing these fairly reliably. In September he had a fall with a substantial bruise in the area of the wound. He says he saw orthopedics and they told him there was some muscle strain sometime it later this opened into a wound. He has a fairly substantial wound on the right posterior calf. Satellite areas around this including medially and posteriorly. He has not worn his stockings since the injury Past medical history; includes chronic renal failure secondary to diabetes with kidney transplant x2, atrial fibrillation, heart failure with preserved ejection fraction, coronary artery disease. ABIs on the right in our clinic were once again noncompressible 08/13/2019 on evaluation today patient appears to be doing decently well with regard to his wound compared to last week's evaluation. Unfortunately he is still having a lot of discomfort at this point which is I think in some part due to the 3 layer compression wrap which is a little bit stronger I think for him. When he was here before we actually utilized a Kerlix and Coban wrap which he states seemed to got a little bit better. Nonetheless I think we can probably drop back to this in light of the discomfort that he had.  Nonetheless the pain was not really right around the wound itself as much as it was around the ankle in particular. The Iodoflex does seem to have done well for him As the wound is appearing somewhat better today which is excellent news. 11/30; still complaining of a lot of pain. Apparently arterial studies I ordered 2 weeks ago are below. I do not believe we have an appointment with vein and vascular as of yet; ABI Findings: +---------+------------------+-----+----------+--------+ Right Rt Pressure (mmHg)IndexWaveform Comment  +---------+------------------+-----+----------+--------+ PTA >254 1.50 monophasic  +---------+------------------+-----+----------+--------+ DP >254 1.50 monophasic  +---------+------------------+-----+----------+--------+ Great T [id number] 0.40 Abnormal   +---------+------------------+-----+----------+--------+ +---------+------------------+-----+----------+-------+ Left Lt Pressure (mmHg)IndexWaveform Comment +---------+------------------+-----+----------+-------+ Brachial 169     +---------+------------------+-----+----------+-------+ PTA >254 1.50 monophasic  +---------+------------------+-----+----------+-------+ DP >254 1.50 monophasic  +---------+------------------+-----+----------+-------+ Great T oe40 0.24 Abnormal   +---------+------------------+-----+----------+-------+ Pedal arteries appear hyperemic. Patient refused Brachial pressure in the right arm. Summary: Right: Resting right ankle-brachial index indicates noncompressible right lower extremity arteries. The right toe-brachial index is abnormal. Left: Resting left ankle-brachial index indicates noncompressible left lower extremity arteries. The left toe-brachial index is abnormal. He is also having considerably more swelling in his left calf. This was not there when I last saw him 2 weeks ago. He tells me that some of the home health compression  wraps have been slipping down and that may be the issue here however a month I am uncertain 15/7; sees vascular on December 22. Still complaining of a lot of pain. DVT study I did  last time was negative for DVT 12/14; still complaining of pain which if this is arterial is certainly claudication and rest keeps him uncomfortable at night. He has an appointment with vein and vascular on December 22. Wound surface is better using Iodoflex. Once the surface of this is satisfactory and we have exhausted the vascular route. Perhaps an advanced treatment option. He has a configuration of the venous ulceration although his arterial studies are not very good. The other issue is the patient has a transplanted kidney. This will make angiography difficult and challenging issue 12/21; still complaining of pain and drainage. We are using Iodoflex on the wound under compression. He sees Dr. Donnetta Hutching tomorrow to evaluate his noninvasive studies noted above. He has a transplanted kidney further complicating the options for angiography. 12/28; still using Iodoflex under compression. I have Dr. Luther Parody note from 12/22. he noted arterial studies revealing monophasic waveforms at the pedal vessels bilaterally and calcified vessels making the ABI unreliable. He did not comment on the reduced TBI's. He felt these were venous wounds based on the palpable dorsalis pedis pulse. 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 And they arranged for formal venous duplex with reflux studies in the next several weeks. Follow-up with either Scot Dock or Dr. Oneida Alar 09/24/2019 still using Iodoflex under compression. He has an appointment with Dr. Doren Custard on 1/7 with regards to his venous disease. The patient was not felt to have a primary arterial problem for the nonhealing of his wound. We did attempt to wrap him with 3 layer when he first came into the clinic he complained of a lot of pain in the ankle although this may have been because the  dressing fell down somewhat. He has far too edema fluid in the right leg for a good prognosis about healing this wound He comes in today with an excoriation on the bottom part of his right fifth toe. He thinks he may have done this taking off his clothes and that something got caught on the toe. There is no open wound however the toe itself is very painful 1/11; we are using Iodoflex under compression. The wound bed is clean. He went on to see Dr. Doren Custard on 09/27/2019 he again feels that the patient's arterial supply is adequate. He feels that he might benefit from right greater saphenous vein ablation for a venous ulcer. In the meantime the area that he was complaining about last week on the right fifth toe. An x-ray that I ordered showed marked soft tissue swelling along the distal aspect of the right foot but there was no evidence of osteomyelitis. He comes in today with the fifth toenail just about coming off. He has black eschar underneath the toe on the plantar aspect. The toe was swollen red and very painful. In the right setting this could be a significant soft tissue infection versus ischemia of the toe itself. He did not show this to Dr. Doren Custard 1/18; I am using Iodoflex under compression a large wound on the right posterior calf. He had his right greater saphenous vein ablation by Dr. Doren Custard although I am not sure that is the only problem here. ooThe right fifth toe which was possibly trauma 2 weeks ago continues to be exceptionally painful with a necrotic tip. Maybe not quite as swollen. I started him empirically on Augmentin 5/125 1 p.o. twice daily last week after discussing this with Dr. Loletha Grayer of nephrology. Perhaps somewhat better this week but not as good as I was  hoping. A plain x-ray was negative. He comes in today with an area on the medial right calf that was blistered and now open. In looking at things he appears to be systemically fluid volume overloaded. He has a transplanted kidney.  He states he takes his Lasix variably when he has appointments he tries not to take it the later I am really not certain if he takes this reliably. However he has far too much edema fluid in the right leg to easily heal this wound and he appears to be developing blisters medially to form additional wounds 1/25; his CO2 angiogram was done by Dr. Doren Custard last week. This showed the proximal arteries all to be patent. On the right side the common femoral deep femoral superficial femoral popliteal anterior tibial and peroneal arteries were patent. The posterior tibial was occluded but reconstituted distally via collaterals from the peroneal artery he was felt he should have enough blood flow to heal his wounds including the right fifth toe. The right fifth toe looks better however he is still complaining of a lot of pain. The large area which is a venous ulceration posteriorly has a better looking surface I think we can switch to Hydrofera Blue today 2/1; the patient's original wounds on the right posterior medial calf has come down in width however superior to this he has new denuded areas and I am concerned we simply do not have enough edema control. He has already undergone a right greater saphenous ablation. He had a CO2 angiogram done by Dr. Doren Custard and the comment was that we should have enough blood flow to heal the venous wound however we simply do not seem to have enough edema control with 3 layer compression. The patient is status post kidney transplant although his exact renal function is not really clear. Nor am i sure what dose of diuretic he is supposed to be on. He also has the area on the right fifth toe which was unclear etiology but I think became secondarily infected I gave him 2 weeks of antibiotics for this and this seems to have settled down he still has a black eschar over the tip of the toe. X-ray was negative for fracture. He says he has a history of gout. 2/8; the patient's wound on  the right posterior calf is about the same. The superficial area medially also about the same. He got a prescription for prednisoneo Gout after he developed erythema on the dorsal aspect of his left great toe going along with the right fifth toe which has been problematic all along. He has not taken it because he is concerned about increasing CBGs. He has a transplanted kidney is already on prednisone 5 mg. He would not be a good candidate for NSAIDs. Perhaps colchicine. He is not aware of what his uric acid level is 2/15; his right posterior calf wound seems to be coming in in terms of width. Everything here looks fairly good. No mechanical debridement we have been using Hydrofera Blue. He has had an ablation by vein and vascular. Felt to have adequate arterial supply to this area. The patient got prednisone last week from Dr. Angelique Holm of nephrology. At my urging he actually took it. The area on his left toe was a lot better. The right toe was not as painful but still erythematous with a wound at the tip. We have been using silver alginate here 2/22; right posterior calf seems to be gradually epithelializing. Still a fairly substantial wound. He  still has an area on the tip of his right fifth toe which I think was gout related. This is gradually improving. We have been using Unna boots to wrap X-ray I did of the foot last time was again negative there was soft tissue irregularity about the distal fifth digit but no radiographic evidence of bone damage 3/1; right posterior calf seems to be gradually improving however there were areas of hyper granulation. We have been using Hydrofera Blue. The hyper granulation is mostly evident in the most superior finger shape projection of the wound itself. ooThe area on the right fifth toe also was slough covered and required debridement. 3/8 continued improvement in the right posterior calf and the tip of the right fifth toe. We have been using Hydrofera Blue  under compression he is changing the area to the toe 3/22; continued improvement in the right posterior calf. Unfortunately comes in today with a new skin tear in the mid anterior tibia area this probably had something to do with changing his dressing home health I called and left this a message last week. 3/29; continued improvement in the a substantial wound on the right posterior calf. The skin tear anteriorly from last week is already healed on the tip of the right fifth toe there is still a nonviable area. 4/5; we have continued contraction of the substantial wound on the right posterior calf. We continue to have problems with the tip of the right fifth toe. We have been using Hydrofera Blue to both wound areas He is complaining about some discomfort in the Achilles area. T me he had surgery for what sounds like a torn Achilles about 10 years ago. ells 4/12; we have continued contraction of the substantial wound on the right posterior calf. ooStill an area on the tip of the right fifth toe. I changed him to silver collagen on the toe last week but I think home health continue to use Hydrofera Blue to both wound areas. 4/19; we continue to have contraction of the substantial wound on the right posterior calf however there continues to be hyper granulation. ooThe area on the tip of the fifth toe is very small still some debris on the surface we have been using silver collagen 4/26; both wounds have contracted. I was hoping the fifth toe wound would close over but with probing there is still an open small hole here. 5/3; his wound on the right posterior calf which was his major area continues to contract looks healthy we have been using Hydrofera Blue. Using silver collagen to the fifth toe, not making a lot of progress here 5/10; right posterior calf wound continues to be smaller in terms of surface area and look healthy we have been using Hydrofera Blue here. The tip of the right fifth toe  is still open requiring debridement. He has a new laceration on the right forearm he says he hit this on a microwave 6 days ago 02/11/20-The right fifth toe wound is closed, the right posterior calf wound looks healthy with smaller dimensions compared with last time, the right forearm area of laceration has a small blister which I try to open up with scalpel with very little fluid 6/7 the right fifth toe looks fine. Area on the right forearm is also closed. Right posterior calf wound continues to contract. Much smaller area remaining with healthy surface. We are using Hydrofera Blue under compression 6/21; area on the right posterior calf. There is only 1 small area in the  most distal part of this. We have been using Hydrofera Blue under compression this is contracting surface looks healthy 6/28; right posterior calf this is healed. This was initially a hematoma. During the stay in this clinic he had a right greater saphenous vein ablation by Dr. Doren Custard of vein and vascular. He also had a difficult time with his right fifth toeo Infection versus gout. Very difficult time with this so that this has been closed for about 6 weeks. ------------------- READMISSION 04/29/20-Patient returns to the clinic this time with a right index finger swelling and pain been drainage, patient has been to the hand surgeon and vascular surgeon and ended up having a small IandD attempted in the office with expression of some gritty whitish material, patient also hit his hand and developed a small hematoma around this area. He also had vascular Dopplers done that show monophasic right radial right ulnar waveforms. He has follow-up appointments with vascular surgery as well as the hand surgeon. His appointment today with a hand surgeon he was told that he may lose the tip of his index finger if surgery is done to open up the entire area. He is reluctant to have any surgery and would like to try conservative measures He has  tried epsom salt soaks with improvement then worsening , he has just finished a course of antibiotics, he does not remember the name 8/16; patient was readmitted to the clinic last week and I am seeing this for the first time. Previously we had looked at this man for a large wound in the right posterior calf that was initially a hematoma. He went on to have a greater saphenous vein ablation. During the stay here he also had a difficult time with an area over his right fifth toe question infectious question gout at that time. This eventually closed over although I do not think the differential was ever really totally clear in my mind. The patient is here this time with a necrotic area on the palmar surface of his right index finger. He states this started in July with a small red area. He spent some time going to see Dr. Jarome Matin of dermatology. Ended up with Dr. 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 He is also seen hand surgery which is fortunate is he is likely to require either debridement or amputation of this finger. He had a course of antibiotics Keflex and doxycycline I believe but he is finished these. Currently using Betadine. He describes this is very painful. I have not had a chance to look back over notes in care everywhere. I would like to see as hand surgery notes, vascular surgery notes and dermatology notes if this is possible. He also apparently had an x-ray ordered by Dr. Amedeo Plenty that I would like to review. 4/97; certainly no improvement in his right index finger palmar aspect. Culture I did last time showed abundant Enterobacter and abundant Proteus. I have him on cefdinir 300 mg every 24 as of Friday in response to this. This was a deep tissue culture. 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 He still complaining of a lot of pain. READMISSION 07/04/2020 Mr. Mounsey is a  74 year old man with wounds on his bilateral lower extremities and bilateral upper extremities which were apparently the consequence of being found down at home in early September. He required admission to the hospital from 9/6 through 9/17 with delirium, hyponatremia, sepsis and  type 2 diabetes. He gradually improved. He was sent to a nursing home in [place] and recently has been discharged home. They are using some foam-based dressings on his bilateral lower and upper extremity wounds as well as his left fifth toe which apparently are felt to be secondary from the prolonged friction and pressure of being recumbent. We have had Mr. Yim in the clinic on 2 different occasions. Predominantly the first time for a large wound on the right posterior calf related to trauma and chronic venous insufficiency. He was here in August with a necrotic wound on the tip of his right index finger which ultimately required amputation this is still currently wrapped. 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 Vanita Panda of nephrology He has not been previously felt to have an arterial issue previous ABIs were noncompressible Patient History Information obtained from Patient. Allergies Elavil (Severity: Moderate, Reaction: hallucination) Family History Diabetes - Siblings, Heart Disease - Father, Stroke - Mother,Maternal Grandparents, No family history of Cancer, Hereditary Spherocytosis, Hypertension, Kidney Disease, Lung Disease, Seizures, Thyroid Problems, Tuberculosis. Social History Former smoker - quit 30 yrs ago, Marital Status - Married, Alcohol Use - Never, Drug Use - No History, Caffeine Use - Daily - coffee. Medical History Eyes Patient has  history of Cataracts Denies history of Glaucoma, Optic Neuritis Ear/Nose/Mouth/Throat Denies history of Chronic sinus problems/congestion, Middle ear problems Hematologic/Lymphatic Patient has history of Anemia Denies history of Hemophilia, Human Immunodeficiency Virus, Lymphedema, Sickle Cell Disease Respiratory Denies history of Aspiration, Asthma, Chronic Obstructive Pulmonary Disease (COPD), Pneumothorax, Sleep Apnea, Tuberculosis Cardiovascular Patient has history of Arrhythmia - afib, Congestive Heart Failure, Hypertension Denies history of Angina, Coronary Artery Disease, Deep Vein Thrombosis, Hypotension, Myocardial Infarction, Peripheral Arterial Disease, Peripheral Venous Disease, Phlebitis Gastrointestinal Denies history of Cirrhosis , Colitis, Crohnoos, Hepatitis A, Hepatitis B, Hepatitis C Endocrine Patient has history of Type II Diabetes Denies history of Type I Diabetes Genitourinary Denies history of End Stage Renal Disease Immunological Denies history of Lupus Erythematosus, Raynaudoos, Scleroderma Integumentary (Skin) Denies history of History of Burn Musculoskeletal Patient has history of Gout Denies history of Rheumatoid Arthritis, Osteoarthritis, Osteomyelitis Neurologic Patient has history of Neuropathy Denies history of Dementia, Quadriplegia, Paraplegia, Seizure Disorder Oncologic Denies history of Received Chemotherapy, Received Radiation Psychiatric Denies history of Anorexia/bulimia, Confinement Anxiety Hospitalization/Surgery History - kidney transplant x2. - av fistula right arm. - achilles tendon repair. - right arm surgery. Medical A Surgical History Notes nd Cardiovascular hyperlipidemia Genitourinary hx kidney transplant Oncologic hx skin CA Review of Systems (ROS) Constitutional Symptoms (General Health) Denies complaints or symptoms of Fatigue, Fever, Chills, Marked Weight Change. Eyes Denies complaints or symptoms of Dry Eyes,  Vision Changes, Glasses / Contacts. Ear/Nose/Mouth/Throat Denies complaints or symptoms of Chronic sinus problems or rhinitis. Respiratory Denies complaints or symptoms of Chronic or frequent coughs, Shortness of Breath. Cardiovascular Denies complaints or symptoms of Chest pain. Gastrointestinal Denies complaints or symptoms of Frequent diarrhea, Nausea, Vomiting. Endocrine Denies complaints or symptoms of Heat/cold intolerance. Genitourinary Denies complaints or symptoms of Frequent urination. Integumentary (Skin) Complains or has symptoms of Wounds. Musculoskeletal Denies complaints or symptoms of Muscle Pain, Muscle Weakness. Neurologic Denies complaints or symptoms of Numbness/parasthesias. Psychiatric Denies complaints or symptoms of Claustrophobia, Suicidal. Objective Constitutional Sitting or standing Blood Pressure is within  target range for patient.. Pulse regular and within target range for patient.Marland Kitchen Respirations regular, non-labored and within target range.. Temperature is normal and within the target range for the patient.Marland Kitchen Appears in no distress. Vitals Time Taken: 10:34 AM, Height: 68 in, Source: Stated, Weight: 192 lbs, Source: Stated, BMI: 29.2, Temperature: 98.6 F, Pulse: 76 bpm, Respiratory Rate: 18 breaths/min, Blood Pressure: 124/82 mmHg, Capillary Blood Glucose: 120 mg/dl. General Notes: CBG per patient Respiratory work of breathing is normal. Bilateral breath sounds are clear and equal in all lobes with no wheezes, rales or rhonchi.. Cardiovascular Heart sounds are normal there is no gallops however his JVP is easily elevated he has 2-3+ sacral edema. Pedal pulses are palpable. 2-3+ pitting edema in his bilateral lower legs nothing looks acute here.. General Notes: Wound exam ooThe major area here is on the left lateral calf. This does not have a viable surface I debrided with a #3 curette I still do not think were able to get anything here that looks  viable. He also had necrotic debris over an area on the dorsal aspect of his left fifth toe also debrided with a #3 curette ooHe has weeping edema coming out of the lateral part of his right calf some erythema here but no tenderness ooHe has small superficial areas on his bilateral forearms Integumentary (Hair, Skin) Skin changes of chronic steroid use. Wound #12 status is Open. Original cause of wound was Trauma. The wound is located on the Left,Lateral Lower Leg. The wound measures 2.2cm length x 2cm width x 0.4cm depth; 3.456cm^2 area and 1.382cm^3 volume. There is Fat Layer (Subcutaneous Tissue) exposed. There is no tunneling or undermining noted. There is a medium amount of serosanguineous drainage noted. There is small (1-33%) pink granulation within the wound bed. There is a large (67-100%) amount of necrotic tissue within the wound bed including Eschar and Adherent Slough. Wound #13 status is Open. Original cause of wound was Gradually Appeared. The wound is located on the Left T Fifth. The wound measures 1.2cm length x oe 0.6cm width x 0.1cm depth; 0.565cm^2 area and 0.057cm^3 volume. There is no tunneling or undermining noted. There is a medium amount of serosanguineous drainage noted. There is no granulation within the wound bed. There is a large (67-100%) amount of necrotic tissue within the wound bed including Eschar and Adherent Slough. Wound #14 status is Open. Original cause of wound was Trauma. The wound is located on the Right,Lateral Lower Leg. The wound measures 6cm length x 5cm width x 0.1cm depth; 23.562cm^2 area and 2.356cm^3 volume. There is Fat Layer (Subcutaneous Tissue) exposed. There is no tunneling or undermining noted. There is a medium amount of serosanguineous drainage noted. There is medium (34-66%) pink granulation within the wound bed. There is a medium (34-66%) amount of necrotic tissue within the wound bed including Adherent Slough. Wound #15 status is Open.  Original cause of wound was Trauma. The wound is located on the Left Elbow. The wound measures 1.2cm length x 1cm width x 0.1cm depth; 0.942cm^2 area and 0.094cm^3 volume. There is Fat Layer (Subcutaneous Tissue) exposed. There is no tunneling or undermining noted. There is a medium amount of serosanguineous drainage noted. There is large (67-100%) red granulation within the wound bed. There is no necrotic tissue within the wound bed. Wound #16 status is Open. Original cause of wound was Trauma. The wound is located on the Left,Anterior Forearm. The wound measures 2.1cm length x 0.3cm width x 0.1cm depth; 0.495cm^2 area and 0.049cm^3  volume. There is Fat Layer (Subcutaneous Tissue) exposed. There is no tunneling or undermining noted. There is a medium amount of serosanguineous drainage noted. There is large (67-100%) red, pink granulation within the wound bed. There is a small (1- 33%) amount of necrotic tissue within the wound bed including Adherent Slough. Wound #17 status is Open. Original cause of wound was Trauma. The wound is located on the Right Forearm. The wound measures 3.5cm length x 4cm width x 0.1cm depth; 10.996cm^2 area and 1.1cm^3 volume. There is Fat Layer (Subcutaneous Tissue) exposed. There is no tunneling or undermining noted. There is a medium amount of serosanguineous drainage noted. There is large (67-100%) red, pink granulation within the wound bed. There is a small (1-33%) amount of necrotic tissue within the wound bed including Adherent Slough. Assessment Active Problems ICD-10 Non-pressure chronic ulcer of left calf with fat layer exposed Non-pressure chronic ulcer of other part of left foot limited to breakdown of skin Non-pressure chronic ulcer of right calf limited to breakdown of skin Abrasion of left upper arm, subsequent encounter Abrasion of right upper arm, subsequent encounter Type 2 diabetes mellitus with other skin ulcer Chronic venous hypertension  (idiopathic) with inflammation of bilateral lower extremity Procedures Wound #12 Pre-procedure diagnosis of Wound #12 is a Diabetic Wound/Ulcer of the Lower Extremity located on the Left,Lateral Lower Leg .Severity of Tissue Pre Debridement is: Fat layer exposed. There was a Excisional Skin/Subcutaneous Tissue Debridement with a total area of 4.4 sq cm performed by Ricard Dillon., MD. With the following instrument(s): Curette to remove Viable and Non-Viable tissue/material. Material removed includes Subcutaneous Tissue, Slough, and Skin: Epidermis after achieving pain control using Lidocaine 4% Topical Solution. No specimens were taken. A time out was conducted at 11:20, prior to the start of the procedure. A Minimum amount of bleeding was controlled with Pressure. The procedure was tolerated well with a pain level of 5 throughout and a pain level of 4 following the procedure. Post Debridement Measurements: 2.2cm length x 2cm width x 0.4cm depth; 1.382cm^3 volume. Character of Wound/Ulcer Post Debridement is improved. Severity of Tissue Post Debridement is: Fat layer exposed. 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 There was a Three Layer Compression Therapy Procedure by [name], RN. Post procedure Diagnosis Wound #12: Same as Pre-Procedure Wound #13 Pre-procedure diagnosis of Wound #13 is a Diabetic Wound/Ulcer of the Lower Extremity located on the Left T Fifth .Severity of Tissue Pre Debridement is: oe Fat layer exposed. There was a Excisional Skin/Subcutaneous Tissue Debridement with a total area of 0.72 sq cm performed by Ricard Dillon., MD. With the following instrument(s): Curette to remove Viable and Non-Viable tissue/material. Material removed includes Eschar, Subcutaneous Tissue, and Slough after achieving pain control using  Lidocaine 4% T opical Solution. No specimens were taken. A time out was conducted at 11:20, prior to the start of the procedure. A Minimum amount of bleeding was controlled with Pressure. The procedure was tolerated well with a pain level of 5 throughout and a pain level of 4 following the procedure. Post Debridement Measurements: 1.2cm length x 0.6cm width x 0.1cm depth; 0.057cm^3 volume. Character of Wound/Ulcer Post Debridement is improved. Severity of Tissue Post Debridement is: Fat layer exposed. Post procedure Diagnosis Wound #13: Same as Pre-Procedure Wound #14 Pre-procedure diagnosis of Wound #14 is a Diabetic Wound/Ulcer of the Lower Extremity located on the  Right,Lateral Lower Leg . There was a Three Layer Compression Therapy Procedure by [name], RN. Post procedure Diagnosis Wound #14: Same as Pre-Procedure Plan Follow-up Appointments: Return Appointment in 1 week. Dressing Change Frequency: Other: - 2 times per week (once at wound center, once at wound center) all wounds Skin Barriers/Peri-Wound Care: Moisturizing lotion - to both legs with dressing changes Wound Cleansing: May shower with protection. May shower and wash wound with soap and water. - on days when dressings are changed Primary Wound Dressing: Wound #15 Left Elbow: Xeroform Wound #16 Left,Anterior Forearm: Xeroform Wound #17 Right Forearm: Xeroform Wound #12 Left,Lateral Lower Leg: Calcium Alginate with Silver Wound #13 Left T Fifth: oe Calcium Alginate with Silver Wound #14 Right,Lateral Lower Leg: Calcium Alginate with Silver Secondary Dressing: Wound #15 Left Elbow: Foam Border Wound #17 Right Forearm: Foam Border Wound #12 Left,Lateral Lower Leg: Dry Gauze Wound #13 Left T Fifth: oe Dry Gauze Wound #14 Right,Lateral Lower Leg: Dry Gauze Edema Control: 3 Layer Compression System - Bilateral Avoid standing for long periods of time Elevate legs to the level of the heart or above for 30  minutes daily and/or when sitting, a frequency of: - whenever sitting throughout the day Exercise regularly Home Health: [facility]. - [facility] The following medication(s) was prescribed: lidocaine topical 4 % cream cream topical for prior to debridement was prescribed at facility 1. We use silver alginate to all the wounds in the bilateral lower extremities including the left calf left fifth toe and the right lateral calf which was more of a weeping area. I put him in 3 layer compression to get rid of some of the edema around the wound I was concerned that otherwise especially the area on the right lateral leg would open further. 2. He is clearly systemically fluid volume overloaded with sacral edema and elevated jugular venous pressure although he tells me he was only recently been put on Lasix by nephrology. 3. We put Xeroform and gauze on his arms 4. [facility] has [facility] who should be able to get out to change this once before we see him next week. 5. I do not believe there is an arterial issue here. No evidence of infection no cultures were ordered. 6. I did not look at his right index finger which is still banded I spent 35 minutes in review of the patient's past medical history, recent hospitalization, face-to-face evaluation and preparation of this record Electronic Signature(s) Signed: 07/04/2020 6:16:54 PM By: [name] MD Entered By: [name] on 07/04/2020 12:26:18 -------------------------------------------------------------------------------- HxROS Details Patient Name: Date of Service: [name] 07/04/2020 10:30 [name] Medical Record Number: [id number] Patient Account Number: [id number] Date of Birth/Sex: Treating RN: 1945/12/12 (73 y.o. Oval Linsey Primary Care Provider: [name] Other Clinician: Referring Provider: Treating Provider/Extender: [name] in  Treatment: 0 Information Obtained From Patient Constitutional Symptoms (General Health) Complaints and Symptoms: Negative for: Fatigue; Fever; Chills; Marked Weight Change Eyes Complaints and Symptoms: Negative for: Dry Eyes; Vision Changes; Glasses / Contacts Medical History: Positive for: Cataracts Negative for: Glaucoma; Optic Neuritis Ear/Nose/Mouth/Throat Complaints and Symptoms: Negative for: Chronic sinus problems or rhinitis Medical History: Negative for: Chronic sinus problems/congestion; Middle ear problems Respiratory Complaints and Symptoms: Negative for: Chronic or frequent coughs; Shortness of Breath Medical History: Negative for: Aspiration; Asthma; Chronic Obstructive Pulmonary Disease (COPD); Pneumothorax; Sleep Apnea; Tuberculosis Cardiovascular Complaints and Symptoms: Negative for: Chest pain  Medical History: Positive for: Arrhythmia - afib; Congestive Heart Failure; Hypertension Negative for: Angina; Coronary Artery Disease; Deep Vein Thrombosis; Hypotension; Myocardial Infarction; Peripheral Arterial Disease; Peripheral Venous Disease; Phlebitis Past Medical History Notes: hyperlipidemia Gastrointestinal Complaints and Symptoms: Negative for: Frequent diarrhea; Nausea; Vomiting Medical History: Negative for: Cirrhosis ; Colitis; Crohns; Hepatitis A; Hepatitis B; Hepatitis C Endocrine Complaints and Symptoms: Negative for: Heat/cold intolerance Medical History: Positive for: Type II Diabetes Negative for: Type I Diabetes Time with diabetes: 1983 Treated with: Insulin, Oral agents Blood sugar tested every day: Yes Tested : daily Blood sugar testing results: Breakfast: 75-120; Bedtime: 180 Genitourinary Complaints and Symptoms: Negative for: Frequent urination Medical History: Negative for: End Stage Renal Disease Past Medical History Notes: hx kidney transplant Integumentary (Skin) Complaints and Symptoms: Positive for: Wounds Medical  History: Negative for: History of Burn Musculoskeletal Complaints and Symptoms: Negative for: Muscle Pain; Muscle Weakness Medical History: Positive for: Gout Negative for: Rheumatoid Arthritis; Osteoarthritis; Osteomyelitis Neurologic Complaints and Symptoms: Negative for: Numbness/parasthesias Medical History: Positive for: Neuropathy Negative for: Dementia; Quadriplegia; Paraplegia; Seizure Disorder Psychiatric Complaints and Symptoms: Negative for: Claustrophobia; Suicidal Medical History: Negative for: Anorexia/bulimia; Confinement Anxiety Hematologic/Lymphatic Medical History: Positive for: Anemia Negative for: Hemophilia; Human Immunodeficiency Virus; Lymphedema; Sickle Cell Disease Immunological Medical History: Negative for: Lupus Erythematosus; Raynauds; Scleroderma Oncologic Medical History: Negative for: Received Chemotherapy; Received Radiation Past Medical History Notes: hx skin CA HBO Extended History Items Eyes: Cataracts Immunizations Pneumococcal Vaccine: Received Pneumococcal Vaccination: Yes Implantable Devices None Hospitalization / Surgery History Type of Hospitalization/Surgery kidney transplant x2 av fistula right arm achilles tendon repair right arm surgery Family and Social History Cancer: No; Diabetes: Yes - Siblings; Heart Disease: Yes - Father; Hereditary Spherocytosis: No; Hypertension: No; Kidney Disease: No; Lung Disease: No; Seizures: No; Stroke: Yes - Mother,Maternal Grandparents; Thyroid Problems: No; Tuberculosis: No; Former smoker - quit 30 yrs ago; Marital Status - Married; Alcohol Use: Never; Drug Use: No History; Caffeine Use: Daily - coffee; Financial Concerns: No; Food, Clothing or Shelter Needs: No; Support System Lacking: No; Transportation Concerns: No Electronic Signature(s) Signed: 07/04/2020 6:06:03 PM By: Carlene Coria RN Signed: 07/04/2020 6:16:54 PM By: [name] MD Entered By: Carlene Coria on 07/04/2020  10:37:00 -------------------------------------------------------------------------------- SuperBill Details Patient Name: Date of Service: [name], [name] 07/04/2020 Medical Record Number: [id number] Patient Account Number: [id number] Date of Birth/Sex: Treating RN: [birth year] (74 y.o. [name] Primary Care Provider: [name] Other Clinician: Referring Provider: Treating Provider/Extender: [name] in Treatment: 0 Diagnosis Coding ICD-10 Codes Code Description [phone] Non-pressure chronic ulcer of left calf with fat layer exposed 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 of skin L97.211 Non-pressure chronic ulcer of right calf limited to breakdown of skin S40.812D Abrasion of left upper arm, subsequent encounter S40.811D Abrasion of right upper arm, subsequent encounter E11.622 Type 2 diabetes mellitus with other skin ulcer I87.323 Chronic venous hypertension (idiopathic) with inflammation of bilateral lower extremity Facility Procedures CPT4 Code: [id number] Description: 99213 - WOUND CARE VISIT-LEV 3 EST PT Modifier: 25 Quantity: 1 CPT4 Code: 08676195 I Description: 09326 - DEB SUBQ TISSUE 20 SQ CM/< CD-10 Diagnosis Description 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 of skin Modifier: Quantity: 1 CPT4 Code: [id number] ( Description: Facility Use Only) [id number] - APPLY MULTLAY COMPRS LWR RT LEG Modifier: 59 1 Quantity: Physician Procedures : KNL9  Code Description Modifier [id number] 99214 - WC PHYS LEVEL 4 - EST PT 25 ICD-10 Diagnosis Description 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.521 Non-pressure chronic ulcer of other part of left foot limited to  breakdown of skin L97.211 Non-pressure chronic ulcer of right calf limited to  breakdown of skin S40.812D Abrasion of left upper arm, subsequent encounter Quantity: 1 : [id number] 11042 - WC PHYS SUBQ TISS 20 SQ CM ICD-10 Diagnosis Description 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 of skin Quantity: 1 Electronic Signature(s) Signed: 07/04/2020 6:16:54 PM By: [name] MD Signed: 07/04/2020 6:20:53 PM By: [name] RN, BSN Entered By: [name] on 07/04/2020 13:04:48

## 2020-07-04 NOTE — Progress Notes (Signed)
[name] ([id number]) Visit Report for 07/04/2020 Allergy List Details Patient Name: Date of Service: [name], [name] 07/04/2020 10:30 [name] Medical Record Number: [id number] Patient Account Number: [id number] Date of Birth/Sex: Treating RN: Nov 12, 1945 (74 y.o. [name]) Carlene Coria Primary Care [name]: [name] Other Clinician: Referring Renea Schoonmaker: Treating [name]/Extender: [name] in Treatment: 0 Allergies Active Allergies Elavil Reaction: hallucination Severity: Moderate Allergy Notes Electronic Signature(s) Signed: 07/04/2020 6:06:03 PM By: Carlene Coria RN Entered By: Carlene Coria on 07/04/2020 10:35:25 -------------------------------------------------------------------------------- Arrival Information Details Patient Name: Date of Service: [name], [name] 07/04/2020 10:30 [name] Medical Record Number: [id number] Patient Account Number: [id number] Date of Birth/Sex: Treating RN: 1946/03/11 (74 y.o. [name]) Carlene Coria Primary Care Tynell Winchell: [name] Other Clinician: Referring [name]: Treating [name]/Extender: [name] in Treatment: 0 Visit Information Patient Arrived: Ambulatory Arrival Time: 10:34 Accompanied By: self Transfer Assistance: None Patient Identification Verified: Yes Secondary Verification Process Completed: Yes Patient Requires Transmission-Based Precautions: No Patient Has Alerts: No History Since Last Visit All ordered tests and consults were completed: No Added or deleted any medications: No Any new allergies or adverse reactions: No Had a fall or experienced change in activities of daily living that may affect risk of falls: No Signs or symptoms of abuse/neglect since last visito No Hospitalized since last visit: No Implantable device outside of the clinic excluding cellular tissue based products placed in the center since last visit: No Pain  Present Now: Yes Electronic Signature(s) Signed: 07/04/2020 6:06:03 PM By: Carlene Coria RN Entered By: Carlene Coria on 07/04/2020 10:34:38 -------------------------------------------------------------------------------- Clinic Level of Care Assessment Details Patient Name: Date of Service: [name], [name] 07/04/2020 10:30 [name] Medical Record Number: [id number] Patient Account Number: [id number] Date of Birth/Sex: Treating RN: 02/07/1946 (74 y.o. [name] Primary Care Jaylina Ramdass: [name] Other Clinician: Referring Marixa Mellott: Treating [name]/Extender: [name] in Treatment: 0 Clinic Level of Care Assessment Items TOOL 1 Quantity Score []  - 0 Use when EandM and Procedure is performed on INITIAL visit ASSESSMENTS - Nursing Assessment / Reassessment X- 1 20 General Physical Exam (combine w/ comprehensive assessment (listed just below) when performed on new pt. evals) X- 1 25 Comprehensive Assessment (HX, ROS, Risk Assessments, Wounds Hx, etc.) ASSESSMENTS - Wound and Skin Assessment / Reassessment []  - 0 Dermatologic / Skin Assessment (not related to wound area) ASSESSMENTS - Ostomy and/or Continence Assessment and Care []  - 0 Incontinence Assessment and Management []  - 0 Ostomy Care Assessment and Management (repouching, etc.) PROCESS - Coordination of Care X - Simple Patient / Family Education for ongoing care 1 15 []  - 0 Complex (extensive) Patient / Family Education for ongoing care X- 1 10 Staff obtains Programmer, systems, Records, T Results / Process Orders est X- 1 10 Staff telephones HHA, Nursing Homes / Clarify orders / etc []  - 0 Routine Transfer to another Facility (non-emergent condition) []  - 0 Routine Hospital Admission (non-emergent condition) X- 1 15 New Admissions / Biomedical engineer / Ordering NPWT Apligraf, etc. , []  - 0 Emergency Hospital Admission (emergent condition) PROCESS - Special Needs []   - 0 Pediatric / Minor Patient Management []  - 0 Isolation Patient Management []  - 0 Hearing / Language / Visual special needs []  - 0 Assessment of Community assistance (transportation, D/C planning, etc.) []  - 0 Additional assistance / Altered mentation []  - 0 Support Surface(s) Assessment (bed, cushion, seat, etc.) INTERVENTIONS -  Miscellaneous []  - 0 External ear exam []  - 0 Patient Transfer (multiple staff / Civil Service fast streamer / Similar devices) []  - 0 Simple Staple / Suture removal (25 or less) []  - 0 Complex Staple / Suture removal (26 or more) []  - 0 Hypo/Hyperglycemic Management (do not check if billed separately) []  - 0 Ankle / Brachial Index (ABI) - do not check if billed separately Has the patient been seen at the hospital within the last three years: Yes Total Score: 95 Level Of Care: New/Established - Level 3 Electronic Signature(s) Signed: 07/04/2020 6:20:53 PM By: [name] RN, BSN Entered By: [name] on 07/04/2020 13:04:04 -------------------------------------------------------------------------------- Compression Therapy Details Patient Name: Date of Service: [name], [name] 07/04/2020 10:30 [name] Medical Record Number: [id number] Patient Account Number: [id number] Date of Birth/Sex: Treating RN: May 29, 1946 (74 y.o. [name] Primary Care Boby Eyer: [name] Other Clinician: Referring Wakeelah Solan: Treating [name]/Extender: [name] in Treatment: 0 Compression Therapy Performed for Wound Assessment: Wound #14 Right,Lateral Lower Leg Performed By: Clinician Michael Pilling, RN Compression Type: Three Layer Post Procedure Diagnosis Same as Pre-procedure Electronic Signature(s) Signed: 07/04/2020 6:20:53 PM By: [name] RN, BSN Entered By: [name] on 07/04/2020 11:28:16 -------------------------------------------------------------------------------- Compression Therapy  Details Patient Name: Date of Service: [name], [name] 07/04/2020 10:30 [name] Medical Record Number: [id number] Patient Account Number: [id number] Date of Birth/Sex: Treating RN: 1946/01/13 (74 y.o. [name] Primary Care Ayani Ospina: [name] Other Clinician: Referring Danaka Llera: Treating Vear Staton/Extender: [name] in Treatment: 0 Compression Therapy Performed for Wound Assessment: Wound #12 Left,Lateral Lower Leg Performed By: Clinician Michael Pilling, RN Compression Type: Three Layer Post Procedure Diagnosis Same as Pre-procedure Electronic Signature(s) Signed: 07/04/2020 6:20:53 PM By: [name] RN, BSN Entered By: [name] on 07/04/2020 11:28:16 -------------------------------------------------------------------------------- Encounter Discharge Information Details Patient Name: Date of Service: [name], [name] 07/04/2020 10:30 [name] Medical Record Number: [id number] Patient Account Number: [id number] Date of Birth/Sex: Treating RN: 20-Feb-1946 (75 y.o. [name], Tammi Klippel Primary Care Christoffer Currier: [name] Other Clinician: Referring Machel Violante: Treating Jericho Cieslik/Extender: [name] in Treatment: 0 Encounter Discharge Information Items Post Procedure Vitals Discharge Condition: Stable Temperature (F): 98.6 Ambulatory Status: Ambulatory Pulse (bpm): 76 Discharge Destination: Home Respiratory Rate (breaths/min): 18 Transportation: Private Auto Blood Pressure (mmHg): 124/82 Accompanied By: self Schedule Follow-up Appointment: Yes Clinical Summary of Care: Electronic Signature(s) Signed: 07/04/2020 5:54:53 PM By: [name] Entered By: [name] on 07/04/2020 12:02:30 -------------------------------------------------------------------------------- Lower Extremity Assessment Details Patient Name: Date of Service: [name], [name] 07/04/2020 10:30 A  [name] Medical Record Number: [id number] Patient Account Number: [id number] Date of Birth/Sex: Treating RN: 25-Nov-1945 (73 y.o. [name]) Carlene Coria Primary Care Kelby Lotspeich: [name] Other Clinician: Referring Cheryn Lundquist: Treating Ashleynicole Mcclees/Extender: [name] in Treatment: 0 Edema Assessment Assessed: Shirlyn Goltz: No] [Right: No] E[Left: dema] [Right: :] Calf Left: Right: Point of Measurement: 42 cm From Medial Instep 41 cm 40 cm Ankle Left: Right: Point of Measurement: 18 cm From Medial Instep 25 cm 22 cm Electronic Signature(s) Signed: 07/04/2020 6:06:03 PM By: Carlene Coria RN Entered By: Carlene Coria on 07/04/2020 10:57:27 -------------------------------------------------------------------------------- Multi Wound Chart Details Patient Name: Date of Service: [name], [name] 07/04/2020 10:30 [name] Medical Record Number: [id number] Patient Account Number: [id number] Date of Birth/Sex: Treating RN: 1946/08/18 (74 y.o. [name] Primary Care Aerie Donica: [name] Other Clinician:  Referring Kismet Facemire: Treating Molleigh Huot/Extender: [name] in Treatment: 0 Vital Signs Height(in): 35 Capillary Blood Glucose(mg/dl): 120 Weight(lbs): 192 Pulse(bpm): 23 Body Mass Index(BMI): 29 Blood Pressure(mmHg): 124/82 Temperature(F): 98.6 Respiratory Rate(breaths/min): 18 Photos: [12:No Photos Lower Leg] [13:No Photos Left T Fifth oe] [14:No Photos Right Lower Leg] Wound Location: [12:Trauma] [13:Gradually Appeared] [14:Trauma] Wounding Event: [12:Diabetic Wound/Ulcer of the Lower] [13:Pressure Ulcer] [14:Diabetic Wound/Ulcer of the Lower] Primary Etiology: [12:Extremity Cataracts, Anemia, Arrhythmia,] [13:Cataracts, Anemia, Arrhythmia,] [14:Extremity Cataracts, Anemia, Arrhythmia,] Comorbid History: [12:Congestive Heart Failure, Hypertension, Type II Diabetes, Gout, Hypertension, Type II Diabetes, Gout,  Neuropathy 05/26/2020] [13:Congestive Heart Failure, Neuropathy 06/18/2020] [14:Congestive Heart Failure, Hypertension, Type II  Diabetes, Gout, Neuropathy 05/26/2020] Date Acquired: [12:0] [13:0] [14:0] Weeks of Treatment: [12:Open] [13:Open] [14:Open] Wound Status: [12:2.2x2x0.4] [13:1.2x0.6x0.1] [14:6x5x0.1] Measurements L x W x D (cm) [12:3.456] [13:0.565] [14:23.562] A (cm) : rea [12:1.382] [13:0.057] [14:2.356] Volume (cm) : [12:Grade 2] [13:Category/Stage II] [14:Grade 2] Classification: [12:Medium] [13:Medium] [14:Medium] Exudate A mount: [12:Serosanguineous] [13:Serosanguineous] [14:Serosanguineous] Exudate Type: [12:red, brown] [13:red, brown] [14:red, brown] Exudate Color: [12:Small (1-33%)] [13:None Present (0%)] [14:Medium (34-66%)] Granulation A mount: [12:Pink] [13:N/A] [14:Pink] Granulation Quality: [12:Large (67-100%)] [13:Large (67-100%)] [14:Medium (34-66%)] Necrotic A mount: [12:Eschar, Adherent Slough] [13:Eschar, Adherent Slough] [14:Adherent Slough] Necrotic Tissue: [12:Fat Layer (Subcutaneous Tissue): Yes Fascia: No] [14:Fat Layer (Subcutaneous Tissue): Yes] Exposed Structures: [12:Fascia: No Tendon: No Muscle: No Joint: No Bone: No None] [13:Fat Layer (Subcutaneous Tissue): No Tendon: No Muscle: No Joint: No Bone: No None] [14:Fascia: No Tendon: No Muscle: No Joint: No Bone: No None] Epithelialization: [12:Debridement - Excisional] [13:Debridement - Excisional] [14:N/A] Debridement: Pre-procedure Verification/Time Out 11:20 [13:11:20] [14:N/A] Taken: [12:Lidocaine 4% Topical Solution] [13:Lidocaine 4% Topical Solution] [14:N/A] Pain Control: [12:Subcutaneous, Slough] [13:Necrotic/Eschar, Subcutaneous,] [14:N/A] Tissue Debrided: [12:Skin/Subcutaneous Tissue] [13:Slough Skin/Subcutaneous Tissue] [14:N/A] Level: [12:4.4] [13:0.72] [14:N/A] Debridement A (sq cm): [12:rea Curette] [13:Curette] [14:N/A] Instrument: [12:Minimum] [13:Minimum] [14:N/A] Bleeding:  [12:Pressure] [13:Pressure] [14:N/A] Hemostasis Achieved: [12:5] [13:5] [14:N/A] Procedural Pain: [12:4] [13:4] [14:N/A] Post Procedural Pain: [12:Procedure was tolerated well] [13:Procedure was tolerated well] [14:N/A] Debridement Treatment Response: [12:2.2x2x0.4] [13:1.2x0.6x0.1] [14:N/A] Post Debridement Measurements L x W x D (cm) [12:1.382] [13:0.057] [14:N/A] Post Debridement Volume: (cm) [12:Compression Therapy] [13:Debridement] [14:Compression Therapy] Procedures Performed: [12:Debridement 15] [13:16] Photos: [12:No Photos Left Elbow] [13:No Photos Left, Anterior Forearm] [14:No Photos Right Forearm] Wound Location: [12:Trauma] [13:Trauma] [14:Trauma] Wounding Event: [12:Skin T ear] [13:Skin T ear] [14:Skin T ear] Primary Etiology: [12:Cataracts, Anemia, Arrhythmia,] [13:Cataracts, Anemia, Arrhythmia,] [14:Cataracts, Anemia, Arrhythmia,] Comorbid History: [12:Congestive Heart Failure, Hypertension, Type II Diabetes, Gout, Hypertension, Type II Diabetes, Gout, Hypertension, Type II Diabetes, Gout, Neuropathy 05/26/2020] [13:Congestive Heart Failure, Neuropathy 05/26/2020] [14:Congestive Heart  Failure, Neuropathy 05/26/2020] Date Acquired: [12:0] [13:0] [14:0] Weeks of Treatment: [12:Open] [13:Open] [14:Open] Wound Status: [12:1.2x1x0.1] [13:2.1x0.3x0.1] [14:3.5x4x0.1] Measurements L x W x D (cm) [12:0.942] [13:0.495] [14:10.996] A (cm) : rea [12:0.094] [13:0.049] [14:1.1] Volume (cm) : [12:Full Thickness Without Exposed] [13:Full Thickness Without Exposed] [14:Full Thickness Without Exposed] Classification: [12:Support Structures Medium] [13:Support Structures Medium] [14:Support Structures Medium] Exudate A mount: [12:Serosanguineous] [13:Serosanguineous] [14:Serosanguineous] Exudate Type: [12:red, brown] [13:red, brown] [14:red, brown] Exudate Color: [12:Large (67-100%)] [13:Large (67-100%)] [14:Large (67-100%)] Granulation Amount: [12:Red] [13:Red, Pink] [14:Red, Pink] Granulation  Quality: [12:None Present (0%)] [13:Small (1-33%)] [14:Small (1-33%)] Necrotic Amount: [12:N/A] [13:Adherent Slough] [14:Adherent Slough] Necrotic Tissue: [12:Fat Layer (Subcutaneous Tissue): Yes Fat Layer (Subcutaneous Tissue): Yes Fat Layer (Subcutaneous Tissue): Yes] Exposed Structures: [12:Fascia: No Tendon: No Muscle: No Joint: No Bone: No None] [13:Fascia: No Tendon: No Muscle: No Joint: No Bone: No None] [  14:Fascia: No Tendon: No Muscle: No Joint: No Bone: No None] Epithelialization: [12:N/A] [13:N/A] [14:N/A] Debridement: [12:N/A] [13:N/A] [14:N/A] Pain Control: [12:N/A] [13:N/A] [14:N/A] Tissue Debrided: [12:N/A] [13:N/A] [14:N/A] Level: [12:N/A] [13:N/A] [14:N/A] Debridement A (sq cm): [12:rea N/A] [13:N/A] [14:N/A] Instrument: [12:N/A N/A N/A] Bleeding: [12:N/A N/A N/A] Hemostasis Achieved: [12:N/A N/A N/A] Procedural Pain: [12:N/A N/A N/A] Post Procedural Pain: [12:N/A N/A N/A] Debridement Treatment Response: [12:N/A N/A N/A] Post Debridement Measurements L x W x D (cm) [12:N/A N/A N/A] Post Debridement Volume: (cm) [12:N/A N/A N/A] Treatment Notes Wound #12 (Left, Lateral Lower Leg) 1. Cleanse With Wound Cleanser 2. Periwound Care Moisturizing lotion 3. Primary Dressing Applied Calcium Alginate Ag 4. Secondary Dressing Dry Gauze 6. Support Layer Applied 3 layer compression wrap Wound #13 (Left Toe Fifth) 1. Cleanse With Wound Cleanser 3. Primary Dressing Applied Calcium Alginate Ag 4. Secondary Dressing Foam Border Dressing 5. Secured With Self Adhesive Bandage Wound #14 (Right, Lateral Lower Leg) 1. Cleanse With Wound Cleanser 2. Periwound Care Moisturizing lotion 3. Primary Dressing Applied Calcium Alginate Ag 4. Secondary Dressing Dry Gauze 6. Support Layer Applied 3 layer compression wrap Wound #15 (Left Elbow) 1. Cleanse With Wound Cleanser 2. Periwound Care Skin Prep 3. Primary Dressing Applied Xeroform Gauze 4. Secondary Dressing Foam  Border Dressing 5. Secured With Self Adhesive Bandage Wound #16 (Left, Anterior Forearm) 1. Cleanse With Wound Cleanser 2. Periwound Care Skin Prep 3. Primary Dressing Applied Xeroform Gauze 4. Secondary Dressing Foam Border Dressing 5. Secured With Self Adhesive Bandage Wound #17 (Right Forearm) 1. Cleanse With Wound Cleanser 2. Periwound Care Skin Prep 3. Primary Dressing Applied Xeroform Gauze 4. Secondary Dressing Foam Border Dressing 5. Secured With Office manager) Signed: 07/04/2020 6:16:54 PM By: [name] MD Signed: 07/04/2020 6:20:53 PM By: [name] RN, BSN Entered By: [name] on 07/04/2020 12:08:44 -------------------------------------------------------------------------------- Multi-Disciplinary Care Plan Details Patient Name: Date of Service: MARTHA, SOLTYS HA [name] 07/04/2020 10:30 [name] Medical Record Number: [id number] Patient Account Number: [id number] Date of Birth/Sex: Treating RN: Nov 24, 1945 (74 y.o. [name] Primary Care Ronnald Shedden: [name] Other Clinician: Referring Anah Billard: Treating Corlene Sabia/Extender: [name] in Treatment: 0 Active Inactive Abuse / Safety / Falls / Self Care Management Nursing Diagnoses: Potential for falls Goals: Patient/caregiver will verbalize/demonstrate measures taken to prevent injury and/or falls Date Initiated: 07/04/2020 Target Resolution Date: 08/01/2020 Goal Status: Active Interventions: Assess fall risk on admission and as needed Assess impairment of mobility on admission and as needed per policy Notes: Nutrition Nursing Diagnoses: Impaired glucose control: actual or potential Potential for alteratiion in Nutrition/Potential for imbalanced nutrition Goals: Patient/caregiver will maintain therapeutic glucose control Date Initiated: 07/04/2020 Target Resolution Date: 08/01/2020 Goal Status:  Active Interventions: Assess HgA1c results as ordered upon admission and as needed Assess patient nutrition upon admission and as needed per policy Treatment Activities: Patient referred to Primary Care Physician for further nutritional evaluation : 07/04/2020 Notes: Venous Leg Ulcer Nursing Diagnoses: Knowledge deficit related to disease process and management Potential for venous Insuffiency (use before diagnosis confirmed) Goals: Patient will maintain optimal edema control Date Initiated: 07/04/2020 Target Resolution Date: 08/01/2020 Goal Status: Active Interventions: Assess peripheral edema status every visit. Compression as ordered Provide education on venous insufficiency Treatment Activities: Therapeutic compression applied : 07/04/2020 Notes: Wound/Skin Impairment Nursing Diagnoses: Impaired tissue integrity Knowledge deficit related to ulceration/compromised skin integrity Goals: Patient/caregiver will verbalize understanding of skin care regimen Date Initiated: 07/04/2020 Target Resolution Date: 08/01/2020 Goal Status: Active Ulcer/skin  breakdown will have a volume reduction of 30% by week 4 Date Initiated: 07/04/2020 Target Resolution Date: 08/01/2020 Goal Status: Active Interventions: Assess patient/caregiver ability to obtain necessary supplies Assess patient/caregiver ability to perform ulcer/skin care regimen upon admission and as needed Assess ulceration(s) every visit Provide education on ulcer and skin care Treatment Activities: Skin care regimen initiated : 07/04/2020 Topical wound management initiated : 07/04/2020 Notes: Electronic Signature(s) Signed: 07/04/2020 6:20:53 PM By: [name] RN, BSN Entered By: [name] on 07/04/2020 13:00:59 -------------------------------------------------------------------------------- Pain Assessment Details Patient Name: Date of Service: [name], [name] 07/04/2020 10:30 [name] Medical Record  Number: [id number] Patient Account Number: [id number] Date of Birth/Sex: Treating RN: 1946/07/18 (73 y.o. [name]) Carlene Coria Primary Care Vint Pola: [name] Other Clinician: Referring Queenie Aufiero: Treating Caroljean Monsivais/Extender: [name] in Treatment: 0 Active Problems Location of Pain Severity and Description of Pain Patient Has Paino No Site Locations Pain Management and Medication Current Pain Management: Electronic Signature(s) Signed: 07/04/2020 6:06:03 PM By: Carlene Coria RN Entered By: Carlene Coria on 07/04/2020 11:14:20 -------------------------------------------------------------------------------- Patient/Caregiver Education Details Patient Name: Date of Service: [name] HA [name] 10/15/2021andnbsp10:30 [name] Medical Record Number: [id number] Patient Account Number: [id number] Date of Birth/Gender: Treating RN: Mar 22, 1946 (74 y.o. [name] Primary Care Physician: [name] Other Clinician: Referring Physician: Treating Physician/Extender: [name] in Treatment: 0 Education Assessment Education Provided To: Patient Education Topics Provided Venous: Methods: Explain/Verbal Responses: Reinforcements needed, State content correctly Wound/Skin Impairment: Methods: Explain/Verbal Responses: Reinforcements needed, State content correctly Electronic Signature(s) Signed: 07/04/2020 6:20:53 PM By: [name] RN, BSN Entered By: [name] on 07/04/2020 13:01:15 -------------------------------------------------------------------------------- Wound Assessment Details Patient Name: Date of Service: [name], [name] 07/04/2020 10:30 [name] Medical Record Number: [id number] Patient Account Number: [id number] Date of Birth/Sex: Treating RN: [birth year] (73 y.o. [name]) Carlene Coria Primary Care Merle Cirelli: [name] Other Clinician: Referring Elchanan Bob: Treating  Kandis Henry/Extender: [name] in Treatment: 0 Wound Status Wound Number: 12 Primary Diabetic Wound/Ulcer of the Lower Extremity Etiology: Wound Location: Lower Leg Wound Open Wounding Event: Trauma Status: Date Acquired: 05/26/2020 Comorbid Cataracts, Anemia, Arrhythmia, Congestive Heart Failure, Weeks Of Treatment: 0 History: Hypertension, Type II Diabetes, Gout, Neuropathy Clustered Wound: No Wound Measurements Length: (cm) 2.2 Width: (cm) 2 Depth: (cm) 0.4 Area: (cm) 3.456 Volume: (cm) 1.382 % Reduction in Area: % Reduction in Volume: Epithelialization: None Tunneling: No Undermining: No Wound Description Classification: Grade 2 Exudate Amount: Medium Exudate Type: Serosanguineous Exudate Color: red, brown Foul Odor After Cleansing: No Slough/Fibrino Yes Wound Bed Granulation Amount: Small (1-33%) Exposed Structure Granulation Quality: Pink Fascia Exposed: No Necrotic Amount: Large (67-100%) Fat Layer (Subcutaneous Tissue) Exposed: Yes Necrotic Quality: Eschar, Adherent Slough Tendon Exposed: No Muscle Exposed: No Joint Exposed: No Bone Exposed: No Electronic Signature(s) Signed: 07/04/2020 6:06:03 PM By: Carlene Coria RN Entered By: Carlene Coria on 07/04/2020 11:07:29 -------------------------------------------------------------------------------- Wound Assessment Details Patient Name: Date of Service: [name], [name] 07/04/2020 10:30 [name] Medical Record Number: [id number] Patient Account Number: [id number] Date of Birth/Sex: Treating RN: 03-09-1946 (73 y.o. [name]) Carlene Coria Primary Care Lavina Resor: [name] Other Clinician: Referring Mahamed Zalewski: Treating Nyx Keady/Extender: [name] in Treatment: 0 Wound Status Wound Number: 13 Primary Pressure Ulcer Etiology: Wound Location: Left T Fifth oe Wound Open Wounding Event: Gradually Appeared Status: Date Acquired:  06/18/2020 Comorbid Cataracts, Anemia, Arrhythmia, Congestive Heart Failure, Weeks Of Treatment: 0  History: Hypertension, Type II Diabetes, Gout, Neuropathy Clustered Wound: No Wound Measurements Length: (cm) 1.2 Width: (cm) 0.6 Depth: (cm) 0.1 Area: (cm) 0.565 Volume: (cm) 0.057 % Reduction in Area: % Reduction in Volume: Epithelialization: None Tunneling: No Undermining: No Wound Description Classification: Category/Stage II Exudate Amount: Medium Exudate Type: Serosanguineous Exudate Color: red, brown Foul Odor After Cleansing: No Slough/Fibrino Yes Wound Bed Granulation Amount: None Present (0%) Exposed Structure Necrotic Amount: Large (67-100%) Fascia Exposed: No Necrotic Quality: Eschar, Adherent Slough Fat Layer (Subcutaneous Tissue) Exposed: No Tendon Exposed: No Muscle Exposed: No Joint Exposed: No Bone Exposed: No Electronic Signature(s) Signed: 07/04/2020 6:06:03 PM By: Carlene Coria RN Entered By: Carlene Coria on 07/04/2020 11:09:05 -------------------------------------------------------------------------------- Wound Assessment Details Patient Name: Date of Service: ARTRELL, LAWLESS HA [name] 07/04/2020 10:30 [name] Medical Record Number: [id number] Patient Account Number: [id number] Date of Birth/Sex: Treating RN: Feb 05, 1946 (73 y.o. [name]) Carlene Coria Primary Care Tamiki Kuba: [name] Other Clinician: Referring Laporshia Hogen: Treating Alina Gilkey/Extender: [name] in Treatment: 0 Wound Status Wound Number: 14 Primary Diabetic Wound/Ulcer of the Lower Extremity Etiology: Wound Location: Right Lower Leg Wound Open Wounding Event: Trauma Status: Date Acquired: 05/26/2020 Comorbid Cataracts, Anemia, Arrhythmia, Congestive Heart Failure, Weeks Of Treatment: 0 History: Hypertension, Type II Diabetes, Gout, Neuropathy Clustered Wound: No Wound Measurements Length: (cm) 6 Width: (cm) 5 Depth: (cm) 0.1 Area: (cm)  23.562 Volume: (cm) 2.356 % Reduction in Area: % Reduction in Volume: Epithelialization: None Tunneling: No Undermining: No Wound Description Classification: Grade 2 Exudate Amount: Medium Exudate Type: Serosanguineous Exudate Color: red, brown Foul Odor After Cleansing: No Slough/Fibrino Yes Wound Bed Granulation Amount: Medium (34-66%) Exposed Structure Granulation Quality: Pink Fascia Exposed: No Necrotic Amount: Medium (34-66%) Fat Layer (Subcutaneous Tissue) Exposed: Yes Necrotic Quality: Adherent Slough Tendon Exposed: No Muscle Exposed: No Joint Exposed: No Bone Exposed: No Electronic Signature(s) Signed: 07/04/2020 6:06:03 PM By: Carlene Coria RN Entered By: Carlene Coria on 07/04/2020 11:10:09 -------------------------------------------------------------------------------- Wound Assessment Details Patient Name: Date of Service: WILMON, CONOVER HA [name] 07/04/2020 10:30 [name] Medical Record Number: [id number] Patient Account Number: [id number] Date of Birth/Sex: Treating RN: 03-18-1946 (73 y.o. [name]) Carlene Coria Primary Care Amerika Nourse: [name] Other Clinician: Referring Elesha Thedford: Treating Kaenan Jake/Extender: [name] in Treatment: 0 Wound Status Wound Number: 15 Primary Skin Tear Etiology: Wound Location: Left Elbow Wound Open Wounding Event: Trauma Status: Date Acquired: 05/26/2020 Comorbid Cataracts, Anemia, Arrhythmia, Congestive Heart Failure, Weeks Of Treatment: 0 History: Hypertension, Type II Diabetes, Gout, Neuropathy Clustered Wound: No Wound Measurements Length: (cm) 1.2 Width: (cm) 1 Depth: (cm) 0.1 Area: (cm) 0.942 Volume: (cm) 0.094 % Reduction in Area: % Reduction in Volume: Epithelialization: None Tunneling: No Undermining: No Wound Description Classification: Full Thickness Without Exposed Support Structures Exudate Amount: Medium Exudate Type: Serosanguineous Exudate Color: red,  brown Foul Odor After Cleansing: No Slough/Fibrino No Wound Bed Granulation Amount: Large (67-100%) Exposed Structure Granulation Quality: Red Fascia Exposed: No Necrotic Amount: None Present (0%) Fat Layer (Subcutaneous Tissue) Exposed: Yes Tendon Exposed: No Muscle Exposed: No Joint Exposed: No Bone Exposed: No Treatment Notes Wound #15 (Left Elbow) 1. Cleanse With Wound Cleanser 2. Periwound Care Skin Prep 3. Primary Dressing Applied Xeroform Gauze 4. Secondary Dressing Foam Border Dressing 5. Secured With Office manager) Signed: 07/04/2020 6:06:03 PM By: Carlene Coria RN Entered By: Carlene Coria on 07/04/2020 11:11:18 -------------------------------------------------------------------------------- Wound Assessment Details Patient Name: Date of Service: KAIEL, WEIDE HA [name] 07/04/2020 10:30 A  [name] Medical Record Number: [id number] Patient Account Number: [id number] Date of Birth/Sex: Treating RN: 06/18/1946 (73 y.o. [name]) Carlene Coria Primary Care Jerred Zaremba: [name] Other Clinician: Referring Rinnah Peppel: Treating Keniya Schlotterbeck/Extender: [name] in Treatment: 0 Wound Status Wound Number: 16 Primary Skin Tear Etiology: Wound Location: Left, Anterior Forearm Wound Open Wounding Event: Trauma Status: Date Acquired: 05/26/2020 Comorbid Cataracts, Anemia, Arrhythmia, Congestive Heart Failure, Weeks Of Treatment: 0 History: Hypertension, Type II Diabetes, Gout, Neuropathy Clustered Wound: No Wound Measurements Length: (cm) 2.1 Width: (cm) 0.3 Depth: (cm) 0.1 Area: (cm) 0.495 Volume: (cm) 0.049 % Reduction in Area: % Reduction in Volume: Epithelialization: None Tunneling: No Undermining: No Wound Description Classification: Full Thickness Without Exposed Support Structures Exudate Amount: Medium Exudate Type: Serosanguineous Exudate Color: red, brown Foul Odor After Cleansing:  No Slough/Fibrino Yes Wound Bed Granulation Amount: Large (67-100%) Exposed Structure Granulation Quality: Red, Pink Fascia Exposed: No Necrotic Amount: Small (1-33%) Fat Layer (Subcutaneous Tissue) Exposed: Yes Necrotic Quality: Adherent Slough Tendon Exposed: No Muscle Exposed: No Joint Exposed: No Bone Exposed: No Treatment Notes Wound #16 (Left, Anterior Forearm) 1. Cleanse With Wound Cleanser 2. Periwound Care Skin Prep 3. Primary Dressing Applied Xeroform Gauze 4. Secondary Dressing Foam Border Dressing 5. Secured With Office manager) Signed: 07/04/2020 6:06:03 PM By: Carlene Coria RN Entered By: Carlene Coria on 07/04/2020 11:12:37 -------------------------------------------------------------------------------- Wound Assessment Details Patient Name: Date of Service: SEVIN, LANGENBACH HA [name] 07/04/2020 10:30 [name] Medical Record Number: [id number] Patient Account Number: [id number] Date of Birth/Sex: Treating RN: 04/12/1946 (73 y.o. [name]) Carlene Coria Primary Care Xavyer Steenson: [name] Other Clinician: Referring Melisa Donofrio: Treating Aliscia Clayton/Extender: [name] in Treatment: 0 Wound Status Wound Number: 17 Primary Skin Tear Etiology: Wound Location: Right Forearm Wound Open Wounding Event: Trauma Status: Date Acquired: 05/26/2020 Comorbid Cataracts, Anemia, Arrhythmia, Congestive Heart Failure, Weeks Of Treatment: 0 History: Hypertension, Type II Diabetes, Gout, Neuropathy Clustered Wound: No Wound Measurements Length: (cm) 3.5 Width: (cm) 4 Depth: (cm) 0.1 Area: (cm) 10.996 Volume: (cm) 1.1 % Reduction in Area: % Reduction in Volume: Epithelialization: None Tunneling: No Undermining: No Wound Description Classification: Full Thickness Without Exposed Support Structures Exudate Amount: Medium Exudate Type: Serosanguineous Exudate Color: red, brown Foul Odor After Cleansing:  No Slough/Fibrino Yes Wound Bed Granulation Amount: Large (67-100%) Exposed Structure Granulation Quality: Red, Pink Fascia Exposed: No Necrotic Amount: Small (1-33%) Fat Layer (Subcutaneous Tissue) Exposed: Yes Necrotic Quality: Adherent Slough Tendon Exposed: No Muscle Exposed: No Joint Exposed: No Bone Exposed: No Treatment Notes Wound #17 (Right Forearm) 1. Cleanse With Wound Cleanser 2. Periwound Care Skin Prep 3. Primary Dressing Applied Xeroform Gauze 4. Secondary Dressing Foam Border Dressing 5. Secured With Office manager) Signed: 07/04/2020 6:06:03 PM By: Carlene Coria RN Entered By: Carlene Coria on 07/04/2020 11:13:55 -------------------------------------------------------------------------------- Vitals Details Patient Name: Date of Service: SIMCHA, SPEIR HA [name] 07/04/2020 10:30 [name] Medical Record Number: [id number] Patient Account Number: [id number] Date of Birth/Sex: Treating RN: [birth year] (73 y.o. [name]) Carlene Coria Primary Care Kristian Mogg: [name] Other Clinician: Referring Bernardo Brayman: Treating Jaysion Ramseyer/Extender: [name] in Treatment: 0 Vital Signs Time Taken: 10:34 Temperature (F): 98.6 Height (in): 68 Pulse (bpm): 76 Source: Stated Respiratory Rate (breaths/min): 18 Weight (lbs): 192 Blood Pressure (mmHg): 124/82 Source: Stated Capillary Blood Glucose (mg/dl): 120 Body Mass Index (BMI): 29.2 Reference Range: 80 - 120 mg / dl Notes CBG per patient Electronic Signature(s) Signed: 07/04/2020 6:06:03  PM By: Carlene Coria RN Entered By: Carlene Coria on 07/04/2020 10:35:18

## 2020-07-11 ENCOUNTER — Other Ambulatory Visit: Payer: Self-pay

## 2020-07-11 ENCOUNTER — Encounter (HOSPITAL_BASED_OUTPATIENT_CLINIC_OR_DEPARTMENT_OTHER): Payer: Medicare Other | Admitting: Internal Medicine

## 2020-07-11 DIAGNOSIS — E1122 Type 2 diabetes mellitus with diabetic chronic kidney disease: Secondary | ICD-10-CM | POA: Diagnosis not present

## 2020-07-11 NOTE — Progress Notes (Signed)
[name], [name] ([id number]) Visit Report for 07/11/2020 Debridement Details Patient Name: Date of Service: [name], [name] 07/11/2020 8:45 [name] Medical Record Number: [id number] Patient Account Number: [id number] Date of Birth/Sex: Treating RN: Nov 30, 1945 (74 y.o. [name] Primary Care Provider: [name] Other Clinician: Referring Provider: Treating Provider/Extender: [name] in Treatment: 1 Debridement Performed for Assessment: Wound #12 Left,Lateral Lower Leg Performed By: Physician Ricard Dillon., MD Debridement Type: Debridement Severity of Tissue Pre Debridement: Fat layer exposed Level of Consciousness (Pre-procedure): Awake and Alert Pre-procedure Verification/Time Out Yes - 09:25 Taken: Start Time: 09:28 Pain Control: Other : benzocaine 20% spray T Area Debrided (L x W): otal 2.5 (cm) x 1.5 (cm) = 3.75 (cm) Tissue and other material debrided: Viable, Non-Viable, Slough, Subcutaneous, Slough Level: Skin/Subcutaneous Tissue Debridement Description: Excisional Instrument: Curette Bleeding: Minimum Hemostasis Achieved: Pressure End Time: 09:30 Procedural Pain: 6 Post Procedural Pain: 2 Response to Treatment: Procedure was tolerated well Level of Consciousness (Post- Awake and Alert procedure): Post Debridement Measurements of Total Wound Length: (cm) 2.5 Width: (cm) 1.5 Depth: (cm) 0.1 Volume: (cm) 0.295 Character of Wound/Ulcer Post Debridement: Improved Severity of Tissue Post Debridement: Fat layer exposed Post Procedure Diagnosis Same as Pre-procedure Electronic Signature(s) Signed: 07/11/2020 4:52:36 PM By: [name] MD Signed: 07/11/2020 5:21:31 PM By: [name] RN, BSN Entered By: [name] on 07/11/2020 09:35:17 -------------------------------------------------------------------------------- Debridement Details Patient Name: Date of Service: [name], [name]  07/11/2020 8:45 [name] Medical Record Number: [id number] Patient Account Number: [id number] Date of Birth/Sex: Treating RN: 1945-11-22 (74 y.o. [name], [name] Primary Care Provider: [name] Other Clinician: Referring Provider: Treating Provider/Extender: [name] in Treatment: 1 Debridement Performed for Assessment: Wound #13 Left T Fifth oe Performed By: Physician Ricard Dillon., MD Debridement Type: Debridement Severity of Tissue Pre Debridement: Fat layer exposed Level of Consciousness (Pre-procedure): Awake and Alert Pre-procedure Verification/Time Out Yes - 09:25 Taken: Start Time: 09:28 Pain Control: Other : benzocaine 20% spray T Area Debrided (L x W): otal 1 (cm) x 0.4 (cm) = 0.4 (cm) Tissue and other material debrided: Viable, Non-Viable, Eschar, Slough, Subcutaneous, Slough Level: Skin/Subcutaneous Tissue Debridement Description: Excisional Instrument: Curette Bleeding: Minimum Hemostasis Achieved: Pressure End Time: 09:30 Procedural Pain: 6 Post Procedural Pain: 2 Response to Treatment: Procedure was tolerated well Level of Consciousness (Post- Awake and Alert procedure): Post Debridement Measurements of Total Wound Length: (cm) 1 Width: (cm) 0.4 Depth: (cm) 0.1 Volume: (cm) 0.031 Character of Wound/Ulcer Post Debridement: Improved Severity of Tissue Post Debridement: Fat layer exposed Post Procedure Diagnosis Same as Pre-procedure Electronic Signature(s) Signed: 07/11/2020 4:52:36 PM By: [name] MD Signed: 07/11/2020 5:21:31 PM By: [name] RN, BSN Entered By: [name] on 07/11/2020 09:35:39 -------------------------------------------------------------------------------- HPI Details Patient Name: Date of Service: [name], [name] 07/11/2020 8:45 [name] Medical Record Number: [id number] Patient Account Number: [id number] Date of Birth/Sex: Treating RN: July 21, 1946 (74 y.o. [name] Primary Care Provider: [name] Other Clinician: Referring Provider: Treating Provider/Extender: [name] in Treatment: 1 History of Present Illness HPI Description: [name] HPI Description: 74 year old gentleman who was recently seen by his nephrologist [name], and noted to have a wound on his left lower extremity which was lacerated 2 months ago and now has reopened. The patient's left shin has a ulceration with some exudate but no evidence of infection and he was referred to [place]  for further care as it was known that the patient has had some peripheral vascular disease in the past. Past medical history significant for chronic kidney disease, atrial fibrillation, diabetes mellitus,status post kidney transplant in 1983 and 2005, a week fistula graft placement, status post previous bowel surgery. 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 10/06/2015 -- x-ray of the left tibia and fibula shows no evidence of osteomyelitis. The patient has also had Doppler studies of his extremity and is awaiting the appointment with the vascular surgeon. We have not yet received these reports. 10/13/2015 -- lower extremity venous duplex reflux evaluation shows reflux in the left common femoral vein, left saphenofemoral junction and the proximal greater saphenous vein extending to the proximal calf. There is also reflux in the left proximal to mid small saphenous vein. Arterial duplex studies done showed the resting ABI was not applicable due to tibial artery medial calcification. 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 The right TBI was 1 which is normal and the left ABI was 1 which is normal. Patient has otherwise been doing fine and has been compliant with his  dressings. 10/20/2015 -- He was seen by [name] recently for a vascular opinion on 10/15/2015. His left lower extremity venous insufficiency duplex study revealed GSV reflux,SS vein reflux and deep venous reflux in the common femoral vein. His ABIs were non compressible and his TBI on the right was 1.01 and on the left was 0.80. He was asked to continue with the wound care with compressive therapy followed by EVLA of the left GS vein 3 months. He recommended 20-30 mm thigh- high compression stockings and the need for a three-month trial of this. The patient had an Unna boot applied at the vascular office but he could not tolerate this with a lot of pain and issues with his toes and hence came here on Friday for removal of this and we reapplied a 2 layer compression. 11/10/2015 -- patient still has not purchased his 20-30 mm thigh-high compression stockings as prescribed by [name]. Readmission: 08/08/18 on evaluation today patient presents for readmission concerning a new injury to the left anterior lower extremity. He was previously seen in 2017 here in our clinic. He states that he has done fairly well since that point. Nonetheless he is having at this time some pain but states that he hit this on a table that fell over and actually struck his leg. This appears to have pulled back some of his skin which folded in on itself and is causing some difficulty as far as that is concerned. There does not appear to be any evidence of infection at this time. No fevers, chills, nausea, or vomiting noted at this time. He's been using dressings on his own currently without complication. 08/15/18 on evaluation today patient actually appears to be doing somewhat better in regard to his wound of the lower [name] when compared to the first visit last week. I had to do a much more extensive debridement at that time it does appear that I'[name] gonna have to perform some debridement today but it does not look to  be as extensive by any means. Nonetheless fortunately he does not show any signs of infection he does have discomfort at this site. I believe based on what I'[name] seeing currently he may benefit from  Iodoflex to help keep the wound bed clean. Patient tolerated therapy without complication. Upon evaluation today the patient actually appears to be doing excellent in regard to his left lower extremity ulcer. This is much better than the previous two visits where he had a lot of necrotic tissue around the edge of the wound simply due to the fact that again there was a significant skin tear where the edge had been cleared away prior to reattaching and being able to heal appropriately. He seems to be doing much better at this point. 08/28/18 on evaluation today the patient's wound actually does appear to be showing signs of improvement. With that being said though he is improving he would likely note even greater improvement if we were able to sharply debride the wound. Nonetheless this caused him to much discomfort he tells me. 09/04/18 on evaluation today patient actually appears to be showing signs of improvement in regard to his left lower extremity ulcer. He has been tolerating the dressing changes including the wrap although he tells me at this point that the burning does last for a couple of days even with just the Iodoflex. I was afraid that this may been part of the issue that he was having with discomfort. It does seem to be the case. Nonetheless he shows no signs of evidence of infection at this time which is good news. No fevers chills noted ADMISSION to [name] 10/05/2018 This is a patient who was cared for in 2017 and again in the fall of this year at our sister clinic and [place]. He actually lives in [place] in [place]. We have been dealing with an apparent traumatic area on the left anterior tibial area. This is been present for the last several months. He was supposed  to be using Iodoflex Kerlix and Coban however he was hospitalized from 09/05/2018 through 09/11/2018 with delirium secondary to pneumonia. Since then he is only been putting Vaseline gauze on this without compression. He also has a more recent skin tear on the dorsal right hand that may have only happened in the last week. The patient had arterial studies done in 2017 in January which was 3 years ago. At that point he had noncompressible ABIs but really quite good TBI's both normal. Triphasic waveforms on the right monophasic at the left posterior tibial but triphasic at the left dorsalis pedis. His ABIs in our clinic today were both noncompressible 1/23; the patient has wounds on the right dorsal hand just distal to the wrist and on the left anterior lower extremity. Both of these look very healthy he is using Hydrofera Blue 1/30; left anterior lower extremity wound much smaller. Healthy looking surface. The laceration area just distal to the wrist on the dorsal hand on the right is also just about closed I used Hydrofera Blue here 2/6; left anterior lower extremity wound is much smaller but still open. The laceration area just distal to the wrist on the dorsal hand is fully epithelialized. 2/13; the patient's anterior lower extremity wound is closed. The laceration just distal to the wrist on the dorsal hand is also fully epithelialized and closed. The patient has external compression stockings which I think are 20/30 READMISSION 08/06/2019 [name] is a 74 year old man with had several times previously in our clinic. He is a diabetic with a history of chronic renal insufficiency status post kidney transplant in 1983 and again in 2005. He was then in 2017 with a laceration on the left  lower extremity. He was worked up at the time with arterial studies and reflux studies. The arterial studies showed ABIs to be noncompressible but TBI's were within normal limits. I do not have the reflux studies  at the moment. He was also sent here in 2019 with a left lower extremity wound and then again in 2020 with left lower extremity trauma a skin tear on the wrist. He was discharged with 20/30 stockings identified from myself that that might not be enough compression. Nevertheless he states he was wearing these fairly reliably. In September he had a fall with a substantial bruise in the area of the wound. He says he saw orthopedics and they told him there was some muscle strain sometime it later this opened into a wound. He has a fairly substantial wound on the right posterior calf. Satellite areas around this including medially and posteriorly. He has not worn his stockings since the injury Past medical history; includes chronic renal failure secondary to diabetes with kidney transplant x2, atrial fibrillation, heart failure with preserved ejection fraction, coronary artery disease. ABIs on the right in our clinic were once again noncompressible 08/13/2019 on evaluation today patient appears to be doing decently well with regard to his wound compared to last week's evaluation. Unfortunately he is still having a lot of discomfort at this point which is I think in some part due to the 3 layer compression wrap which is a little bit stronger I think for him. When he was here before we actually utilized a Kerlix and Coban wrap which he states seemed to got a little bit better. Nonetheless I think we can probably drop back to this in light of the discomfort that he had. Nonetheless the pain was not really right around the wound itself as much as it was around the ankle in particular. The Iodoflex does seem to have done well for him As the wound is appearing somewhat better today which is excellent news. 11/30; still complaining of a lot of pain. Apparently arterial studies I ordered 2 weeks ago are below. I do not believe we have an appointment with vein and vascular as of yet; ABI  Findings: +---------+------------------+-----+----------+--------+ Right Rt Pressure (mmHg)IndexWaveform Comment  +---------+------------------+-----+----------+--------+ PTA >254 1.50 monophasic  +---------+------------------+-----+----------+--------+ DP >254 1.50 monophasic  +---------+------------------+-----+----------+--------+ Great T [id number] 0.40 Abnormal   +---------+------------------+-----+----------+--------+ +---------+------------------+-----+----------+-------+ Left Lt Pressure (mmHg)IndexWaveform Comment +---------+------------------+-----+----------+-------+ Brachial 169     +---------+------------------+-----+----------+-------+ PTA >254 1.50 monophasic  +---------+------------------+-----+----------+-------+ DP >254 1.50 monophasic  +---------+------------------+-----+----------+-------+ Great T oe40 0.24 Abnormal   +---------+------------------+-----+----------+-------+ Pedal arteries appear hyperemic. Patient refused Brachial pressure in the right arm. Summary: Right: Resting right ankle-brachial index indicates noncompressible right lower extremity arteries. The right toe-brachial index is abnormal. Left: Resting left ankle-brachial index indicates noncompressible left lower extremity arteries. The left toe-brachial index is abnormal. He is also having considerably more swelling in his left calf. This was not there when I last saw him 2 weeks ago. He tells me that some of the home health compression wraps have been slipping down and that may be the issue here however a month I am uncertain 64/4; sees vascular on December 22. Still complaining of a lot of pain. DVT study I did last time was negative for DVT 12/14; still complaining of pain which if this is arterial is certainly claudication and rest keeps him uncomfortable at night. He has an appointment with vein and vascular on December 22. Wound surface is better  using Iodoflex. Once the surface of this is  satisfactory and we have exhausted the vascular route. Perhaps an advanced treatment option. He has a configuration of the venous ulceration although his arterial studies are not very good. The other issue is the patient has a transplanted kidney. This will make angiography difficult and challenging issue 12/21; still complaining of pain and drainage. We are using Iodoflex on the wound under compression. He sees Dr. Donnetta Hutching tomorrow to evaluate his noninvasive studies noted above. He has a transplanted kidney further complicating the options for angiography. 12/28; still using Iodoflex under compression. I have Dr. Luther Parody note from 12/22. he noted arterial studies revealing monophasic waveforms at the pedal vessels bilaterally and calcified vessels making the ABI unreliable. He did not comment on the reduced TBI's. He felt these were venous wounds based on the palpable dorsalis pedis pulse. 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 And they arranged for formal venous duplex with reflux studies in the next several weeks. Follow-up with either Scot Dock or Dr. Oneida Alar 09/24/2019 still using Iodoflex under compression. He has an appointment with Dr. Doren Custard on 1/7 with regards to his venous disease. The patient was not felt to have a primary arterial problem for the nonhealing of his wound. We did attempt to wrap him with 3 layer when he first came into the clinic he complained of a lot of pain in the ankle although this may have been because the dressing fell down somewhat. He has far too edema fluid in the right leg for a good prognosis about healing this wound He comes in today with an excoriation on the bottom part of his right fifth toe. He thinks he may have done this taking off his clothes and that something got caught on the toe. There is no open wound however the toe itself is very painful 1/11; we are using Iodoflex under compression. The wound bed is  clean. He went on to see Dr. Doren Custard on 09/27/2019 he again feels that the patient's arterial supply is adequate. He feels that he might benefit from right greater saphenous vein ablation for a venous ulcer. In the meantime the area that he was complaining about last week on the right fifth toe. An x-ray that I ordered showed marked soft tissue swelling along the distal aspect of the right foot but there was no evidence of osteomyelitis. He comes in today with the fifth toenail just about coming off. He has black eschar underneath the toe on the plantar aspect. The toe was swollen red and very painful. In the right setting this could be a significant soft tissue infection versus ischemia of the toe itself. He did not show this to Dr. Doren Custard 1/18; I am using Iodoflex under compression a large wound on the right posterior calf. He had his right greater saphenous vein ablation by Dr. Doren Custard although I am not sure that is the only problem here. The right fifth toe which was possibly trauma 2 weeks ago continues to be exceptionally painful with a necrotic tip. Maybe not quite as swollen. I started him empirically on Augmentin 5/125 1 p.o. twice daily last week after discussing this with Dr. Loletha Grayer of nephrology. Perhaps somewhat better this week but not as good as I was hoping. A plain x-ray was negative. He comes in today with an area on the medial right calf that was blistered and now open. In looking at things he appears to be systemically fluid volume overloaded. He has a transplanted kidney. He states he takes his Lasix variably when  he has appointments he tries not to take it the later I am really not certain if he takes this reliably. However he has far too much edema fluid in the right leg to easily heal this wound and he appears to be developing blisters medially to form additional wounds 1/25; his CO2 angiogram was done by Dr. Doren Custard last week. This showed the proximal arteries all to be patent. On the right  side the common femoral deep femoral superficial femoral popliteal anterior tibial and peroneal arteries were patent. The posterior tibial was occluded but reconstituted distally via collaterals from the peroneal artery he was felt he should have enough blood flow to heal his wounds including the right fifth toe. The right fifth toe looks better however he is still complaining of a lot of pain. The large area which is a venous ulceration posteriorly has a better looking surface I think we can switch to Hydrofera Blue today 2/1; the patient's original wounds on the right posterior medial calf has come down in width however superior to this he has new denuded areas and I am concerned we simply do not have enough edema control. He has already undergone a right greater saphenous ablation. He had a CO2 angiogram done by Dr. Doren Custard and the comment was that we should have enough blood flow to heal the venous wound however we simply do not seem to have enough edema control with 3 layer compression. The patient is status post kidney transplant although his exact renal function is not really clear. Nor am i sure what dose of diuretic he is supposed to be on. He also has the area on the right fifth toe which was unclear etiology but I think became secondarily infected I gave him 2 weeks of antibiotics for this and this seems to have settled down he still has a black eschar over the tip of the toe. X-ray was negative for fracture. He says he has a history of gout. 2/8; the patient's wound on the right posterior calf is about the same. The superficial area medially also about the same. He got a prescription for prednisoneo Gout after he developed erythema on the dorsal aspect of his left great toe going along with the right fifth toe which has been problematic all along. He has not taken it because he is concerned about increasing CBGs. He has a transplanted kidney is already on prednisone 5 mg. He would not be a  good candidate for NSAIDs. Perhaps colchicine. He is not aware of what his uric acid level is 2/15; his right posterior calf wound seems to be coming in in terms of width. Everything here looks fairly good. No mechanical debridement we have been using Hydrofera Blue. He has had an ablation by vein and vascular. Felt to have adequate arterial supply to this area. The patient got prednisone last week from Dr. Angelique Holm of nephrology. At my urging he actually took it. The area on his left toe was a lot better. The right toe was not as painful but still erythematous with a wound at the tip. We have been using silver alginate here 2/22; right posterior calf seems to be gradually epithelializing. Still a fairly substantial wound. He still has an area on the tip of his right fifth toe which I think was gout related. This is gradually improving. We have been using Unna boots to wrap X-ray I did of the foot last time was again negative there was soft tissue irregularity about the distal  fifth digit but no radiographic evidence of bone damage 3/1; right posterior calf seems to be gradually improving however there were areas of hyper granulation. We have been using Hydrofera Blue. The hyper granulation is mostly evident in the most superior finger shape projection of the wound itself. The area on the right fifth toe also was slough covered and required debridement. 3/8 continued improvement in the right posterior calf and the tip of the right fifth toe. We have been using Hydrofera Blue under compression he is changing the area to the toe 3/22; continued improvement in the right posterior calf. Unfortunately comes in today with a new skin tear in the mid anterior tibia area this probably had something to do with changing his dressing home health I called and left this a message last week. 3/29; continued improvement in the a substantial wound on the right posterior calf. The skin tear anteriorly from last week  is already healed on the tip of the right fifth toe there is still a nonviable area. 4/5; we have continued contraction of the substantial wound on the right posterior calf. We continue to have problems with the tip of the right fifth toe. We have been using Hydrofera Blue to both wound areas He is complaining about some discomfort in the Achilles area. T me he had surgery for what sounds like a torn Achilles about 10 years ago. ells 4/12; we have continued contraction of the substantial wound on the right posterior calf. Still an area on the tip of the right fifth toe. I changed him to silver collagen on the toe last week but I think home health continue to use Hydrofera Blue to both wound areas. 4/19; we continue to have contraction of the substantial wound on the right posterior calf however there continues to be hyper granulation. The area on the tip of the fifth toe is very small still some debris on the surface we have been using silver collagen 4/26; both wounds have contracted. I was hoping the fifth toe wound would close over but with probing there is still an open small hole here. 5/3; his wound on the right posterior calf which was his major area continues to contract looks healthy we have been using Hydrofera Blue. Using silver collagen to the fifth toe, not making a lot of progress here 5/10; right posterior calf wound continues to be smaller in terms of surface area and look healthy we have been using Hydrofera Blue here. The tip of the right fifth toe is still open requiring debridement. He has a new laceration on the right forearm he says he hit this on a microwave 6 days ago 02/11/20-The right fifth toe wound is closed, the right posterior calf wound looks healthy with smaller dimensions compared with last time, the right forearm area of laceration has a small blister which I try to open up with scalpel with very little fluid 6/7 the right fifth toe looks fine. Area on the right  forearm is also closed. Right posterior calf wound continues to contract. Much smaller area remaining with healthy surface. We are using Hydrofera Blue under compression 6/21; area on the right posterior calf. There is only 1 small area in the most distal part of this. We have been using Hydrofera Blue under compression this is contracting surface looks healthy 6/28; right posterior calf this is healed. This was initially a hematoma. During the stay in this clinic he had a right greater saphenous vein ablation by Dr. Doren Custard of  vein and vascular. He also had a difficult time with his right fifth toeo Infection versus gout. Very difficult time with this so that this has been closed for about 6 weeks. ------------------- READMISSION 04/29/20-Patient returns to the clinic this time with a right index finger swelling and pain been drainage, patient has been to the hand surgeon and vascular surgeon and ended up having a small IandD attempted in the office with expression of some gritty whitish material, patient also hit his hand and developed a small hematoma around this area. He also had vascular Dopplers done that show monophasic right radial right ulnar waveforms. He has follow-up appointments with vascular surgery as well as the hand surgeon. His appointment today with a hand surgeon he was told that he may lose the tip of his index finger if surgery is done to open up the entire area. He is reluctant to have any surgery and would like to try conservative measures He has tried epsom salt soaks with improvement then worsening , he has just finished a course of antibiotics, he does not remember the name 8/16; patient was readmitted to the clinic last week and I am seeing this for the first time. Previously we had looked at this man for a large wound in the right posterior calf that was initially a hematoma. He went on to have a greater saphenous vein ablation. During the stay here he also had a difficult  time with an area over his right fifth toe question infectious question gout at that time. This eventually closed over although I do not think the differential was ever really totally clear in my mind. The patient is here this time with a necrotic area on the palmar surface of his right index finger. He states this started in July with a small red area. He spent some time going to see Dr. Jarome Matin of dermatology. Ended up with Dr. 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 He is also seen hand surgery which is fortunate is he is likely to require either debridement or amputation of this finger. He had a course of antibiotics Keflex and doxycycline I believe but he is finished these. Currently using Betadine. He describes this is very painful. I have not had a chance to look back over notes in care everywhere. I would like to see as hand surgery notes, vascular surgery notes and dermatology notes if this is possible. He also apparently had an x-ray ordered by Dr. Amedeo Plenty that I would like to review. 4/09; certainly no improvement in his right index finger palmar aspect. Culture I did last time showed abundant Enterobacter and abundant Proteus. I have him on cefdinir 300 mg every 24 as of Friday in response to this. This was a deep tissue culture. 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 He still complaining of a lot of pain. READMISSION 07/04/2020 [name] is a 74 year old man with wounds on his bilateral lower extremities and bilateral upper extremities which were apparently the consequence of being found down at home in early September. He required admission to the hospital from 9/6 through 9/17 with delirium, hyponatremia, sepsis and type 2 diabetes. He gradually improved. He was sent to a nursing home in [place] and recently has been discharged home. They are using some foam-based  dressings on his bilateral lower and upper extremity wounds as well as his left fifth toe which apparently are felt to be secondary  from the prolonged friction and pressure of being recumbent. We have had Mr. Tanori in the clinic on 2 different occasions. Predominantly the first time for a large wound on the right posterior calf related to trauma and chronic venous insufficiency. He was here in August with a necrotic wound on the tip of his right index finger which ultimately required amputation this is still currently wrapped. 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 Vanita Panda of nephrology He has not been previously felt to have an arterial issue previous ABIs were noncompressible 10/22; readmitted to the clinic last week. His wounds on the upper extremities are healed as well as the right lower extremity. He still has an area on the left lateral calf and the lateral part of his fifth toe. We have been using silver alginate. He looks like he has had a lot of edema improvement with the recent addition to his diuretics by Dr. Cheri Kearns of nephrology Electronic Signature(s) Signed: 07/11/2020 4:52:36 PM By: [name] MD Entered By: [name] on 07/11/2020 09:36:44 -------------------------------------------------------------------------------- Physical Exam Details Patient Name: Date of Service: [name], [name] 07/11/2020 8:45 [name] Medical Record Number: [id number] Patient Account Number: [id number] Date of Birth/Sex: Treating RN: 09-17-1946 (74 y.o. [name] Primary Care Provider: [name] Other Clinician: Referring Provider: Treating Provider/Extender: [name]  in Treatment: 1 Constitutional Patient is hypertensive.. Pulse regular and within target range for patient.Marland Kitchen Respirations regular, non-labored and within target range.. Temperature is normal and within the target range for the patient.Marland Kitchen Appears in no distress. Cardiovascular Much better edema control. Notes Wound exam The major areas on the left lateral calf. Still not a viable surface. This was debrided with a #5 curette I am able to get this to clean up a lot better this week. Also the area on the lateral aspect of the left fifth toe. This was also debrided to get necrotic debris off the surface and I am able to get to a much better surface in both wound areas. Electronic Signature(s) Signed: 07/11/2020 4:52:36 PM By: [name] MD Entered By: [name] on 07/11/2020 09:38:19 -------------------------------------------------------------------------------- Physician Orders Details Patient Name: Date of Service: [name], [name] 07/11/2020 8:45 [name] Medical Record Number: [id number] Patient Account Number: [id number] Date of Birth/Sex: Treating RN: 1946/02/25 (74 y.o. [name] Primary Care Provider: [name] Other Clinician: Referring Provider: Treating Provider/Extender: [name] in Treatment: 1 Verbal / Phone Orders: No Diagnosis Coding ICD-10 Coding Code Description [phone] Non-pressure chronic ulcer of left calf with fat layer exposed 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 of skin L97.211 Non-pressure chronic ulcer of right calf limited to breakdown of skin S40.812D Abrasion of left upper arm, subsequent encounter S40.811D Abrasion of right upper arm, subsequent encounter E11.622 Type 2 diabetes mellitus with other skin ulcer I87.323 Chronic venous hypertension (idiopathic) with inflammation of bilateral lower extremity Follow-up Appointments Return Appointment in  1 week. Dressing Change Frequency Wound #12 Left,Lateral Lower Leg Other: - 2 times per week (once at wound center, once at wound center) all wounds Wound #13 Left T Fifth oe Other: - 2 times per week (once at wound center, once  at wound center) all wounds Skin Barriers/Peri-Wound Care Moisturizing lotion - to left leg with dressing changes and right leg daily Wound Cleansing May shower with protection. May shower and wash wound with soap and water. - on days when dressings are changed Primary Wound Dressing Wound #12 Left,Lateral Lower Leg Calcium Alginate with Silver Wound #13 Left T Fifth oe Calcium Alginate with Silver Secondary Dressing Wound #12 Left,Lateral Lower Leg Dry Gauze Wound #13 Left T Fifth oe Kerlix/Rolled Gauze Dry Gauze Edema Control 3 Layer Compression System - Left Lower Extremity Avoid standing for long periods of time Elevate legs to the level of the heart or above for 30 minutes daily and/or when sitting, a frequency of: - whenever sitting throughout the day Exercise regularly Support Garment 20-30 mm/Hg pressure to: - compression stocking to right leg daily, apply in first thing in the morning and remove at night [facility] for wound care. - [facility] Electronic Signature(s) Signed: 07/11/2020 4:52:36 PM By: [name] MD Signed: 07/11/2020 5:21:31 PM By: [name] RN, BSN Entered By: [name] on 07/11/2020 09:34:23 -------------------------------------------------------------------------------- Problem List Details Patient Name: Date of Service: [name], [name] 07/11/2020 8:45 [name] Medical Record Number: [id number] Patient Account Number: [id number] Date of Birth/Sex: Treating RN: 11/28/1945 (74 y.o. [name], [name] Primary Care Provider: [name] Other Clinician: Referring Provider: Treating Provider/Extender: [name] in Treatment:  1 Active Problems ICD-10 Encounter Code Description Active Date MDM Diagnosis [phone] Non-pressure chronic ulcer of left calf with fat layer exposed 07/04/2020 No Yes 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 of 07/04/2020 No Yes skin L97.211 Non-pressure chronic ulcer of right calf limited to breakdown of skin 07/04/2020 No Yes S40.812D Abrasion of left upper arm, subsequent encounter 07/04/2020 No Yes S40.811D Abrasion of right upper arm, subsequent encounter 07/04/2020 No Yes E11.622 Type 2 diabetes mellitus with other skin ulcer 07/04/2020 No Yes I87.323 Chronic venous hypertension (idiopathic) with inflammation of bilateral lower 07/04/2020 No Yes extremity Inactive Problems Resolved Problems Electronic Signature(s) Signed: 07/11/2020 4:52:36 PM By: [name] MD Entered By: [name] on 07/11/2020 09:34:59 -------------------------------------------------------------------------------- Progress Note Details Patient Name: Date of Service: [name] HA [name] 07/11/2020 8:45 [name] Medical Record Number: [id number] Patient Account Number: [id number] Date of Birth/Sex: Treating RN: 06/01/1946 (74 y.o. [name] Primary Care Provider: [name] Other Clinician: Referring Provider: Treating Provider/Extender: [name] in Treatment: 1 Subjective History of Present Illness (HPI) [place] HPI Description: 74 year old gentleman who was recently seen by his nephrologist [name], and noted to have a wound on his left lower extremity which was lacerated 2 months ago and now has reopened. The patient's left shin has a ulceration with some exudate but no evidence of infection and he was referred to [place] for further care as it was known that the patient has had some peripheral vascular disease in the past. Past medical history significant for chronic kidney disease, atrial  fibrillation, diabetes mellitus,status post kidney transplant in 1983 and 2005, a week fistula graft placement, status post previous bowel surgery. 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 10/06/2015 -- x-ray of the left tibia and fibula shows no evidence of osteomyelitis. The patient has also had Doppler studies of his extremity and is awaiting the appointment with the vascular surgeon. We have not yet  received these reports. 10/13/2015 -- lower extremity venous duplex reflux evaluation shows reflux in the left common femoral vein, left saphenofemoral junction and the proximal greater saphenous vein extending to the proximal calf. There is also reflux in the left proximal to mid small saphenous vein. Arterial duplex studies done showed the resting ABI was not applicable due to tibial artery medial calcification. 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 The right TBI was 1 which is normal and the left ABI was 1 which is normal. Patient has otherwise been doing fine and has been compliant with his dressings. 10/20/2015 -- He was seen by [name] recently for a vascular opinion on 10/15/2015. His left lower extremity venous insufficiency duplex study revealed GSV reflux,SS vein reflux and deep venous reflux in the common femoral vein. His ABIs were non compressible and his TBI on the right was 1.01 and on the left was 0.80. He was asked to continue with the wound care with compressive therapy followed by EVLA of the left GS vein 3 months. He recommended 20-30 mm thigh- high compression stockings and the need for a three-month trial of this. The patient had an Unna boot applied at the vascular office but he could not tolerate this with a lot of pain and issues with his toes and hence came here on Friday for removal of this and we  reapplied a 2 layer compression. 11/10/2015 -- patient still has not purchased his 20-30 mm thigh-high compression stockings as prescribed by [name]. Readmission: 08/08/18 on evaluation today patient presents for readmission concerning a new injury to the left anterior lower extremity. He was previously seen in 2017 here in our clinic. He states that he has done fairly well since that point. Nonetheless he is having at this time some pain but states that he hit this on a table that fell over and actually struck his leg. This appears to have pulled back some of his skin which folded in on itself and is causing some difficulty as far as that is concerned. There does not appear to be any evidence of infection at this time. No fevers, chills, nausea, or vomiting noted at this time. He's been using dressings on his own currently without complication. 08/15/18 on evaluation today patient actually appears to be doing somewhat better in regard to his wound of the lower [name] when compared to the first visit last week. I had to do a much more extensive debridement at that time it does appear that I'[name] gonna have to perform some debridement today but it does not look to be as extensive by any means. Nonetheless fortunately he does not show any signs of infection he does have discomfort at this site. I believe based on what I'[name] seeing currently he may benefit from Iodoflex to help keep the wound bed clean. Patient tolerated therapy without complication. Upon evaluation today the patient actually appears to be doing excellent in regard to his left lower extremity ulcer. This is much better than the previous two visits where he had a lot of necrotic tissue around the edge of the wound simply due to the fact that again there was a significant skin tear where the edge had been cleared away prior to reattaching and being able to heal appropriately. He seems to be doing much better at this point. 08/28/18 on  evaluation today the patient's wound actually  does appear to be showing signs of improvement. With that being said though he is improving he would likely note even greater improvement if we were able to sharply debride the wound. Nonetheless this caused him to much discomfort he tells me. 09/04/18 on evaluation today patient actually appears to be showing signs of improvement in regard to his left lower extremity ulcer. He has been tolerating the dressing changes including the wrap although he tells me at this point that the burning does last for a couple of days even with just the Iodoflex. I was afraid that this may been part of the issue that he was having with discomfort. It does seem to be the case. Nonetheless he shows no signs of evidence of infection at this time which is good news. No fevers chills noted ADMISSION to [name] 10/05/2018 This is a patient who was cared for in 2017 and again in the fall of this year at our sister clinic and [place]. He actually lives in [place] in [place]. We have been dealing with an apparent traumatic area on the left anterior tibial area. This is been present for the last several months. He was supposed to be using Iodoflex Kerlix and Coban however he was hospitalized from 09/05/2018 through 09/11/2018 with delirium secondary to pneumonia. Since then he is only been putting Vaseline gauze on this without compression. He also has a more recent skin tear on the dorsal right hand that may have only happened in the last week. The patient had arterial studies done in 2017 in January which was 3 years ago. At that point he had noncompressible ABIs but really quite good TBI's both normal. Triphasic waveforms on the right monophasic at the left posterior tibial but triphasic at the left dorsalis pedis. His ABIs in our clinic today were both noncompressible 1/23; the patient has wounds on the right dorsal hand just distal to the wrist and on  the left anterior lower extremity. Both of these look very healthy he is using Hydrofera Blue 1/30; left anterior lower extremity wound much smaller. Healthy looking surface. The laceration area just distal to the wrist on the dorsal hand on the right is also just about closed I used Hydrofera Blue here 2/6; left anterior lower extremity wound is much smaller but still open. The laceration area just distal to the wrist on the dorsal hand is fully epithelialized. 2/13; the patient's anterior lower extremity wound is closed. The laceration just distal to the wrist on the dorsal hand is also fully epithelialized and closed. The patient has external compression stockings which I think are 20/30 READMISSION 08/06/2019 [name] is a 74 year old man with had several times previously in our clinic. He is a diabetic with a history of chronic renal insufficiency status post kidney transplant in 1983 and again in 2005. He was then in 2017 with a laceration on the left lower extremity. He was worked up at the time with arterial studies and reflux studies. The arterial studies showed ABIs to be noncompressible but TBI's were within normal limits. I do not have the reflux studies at the moment. He was also sent here in 2019 with a left lower extremity wound and then again in 2020 with left lower extremity trauma a skin tear on the wrist. He was discharged with 20/30 stockings identified from myself that that might not be enough compression. Nevertheless he states he was wearing these fairly reliably. In September he had a fall with a substantial  bruise in the area of the wound. He says he saw orthopedics and they told him there was some muscle strain sometime it later this opened into a wound. He has a fairly substantial wound on the right posterior calf. Satellite areas around this including medially and posteriorly. He has not worn his stockings since the injury Past medical history; includes chronic renal  failure secondary to diabetes with kidney transplant x2, atrial fibrillation, heart failure with preserved ejection fraction, coronary artery disease. ABIs on the right in our clinic were once again noncompressible 08/13/2019 on evaluation today patient appears to be doing decently well with regard to his wound compared to last week's evaluation. Unfortunately he is still having a lot of discomfort at this point which is I think in some part due to the 3 layer compression wrap which is a little bit stronger I think for him. When he was here before we actually utilized a Kerlix and Coban wrap which he states seemed to got a little bit better. Nonetheless I think we can probably drop back to this in light of the discomfort that he had. Nonetheless the pain was not really right around the wound itself as much as it was around the ankle in particular. The Iodoflex does seem to have done well for him As the wound is appearing somewhat better today which is excellent news. 11/30; still complaining of a lot of pain. Apparently arterial studies I ordered 2 weeks ago are below. I do not believe we have an appointment with vein and vascular as of yet; ABI Findings: +---------+------------------+-----+----------+--------+ Right Rt Pressure (mmHg)IndexWaveform Comment  +---------+------------------+-----+----------+--------+ PTA >254 1.50 monophasic  +---------+------------------+-----+----------+--------+ DP >254 1.50 monophasic  +---------+------------------+-----+----------+--------+ Great T [id number] 0.40 Abnormal   +---------+------------------+-----+----------+--------+ +---------+------------------+-----+----------+-------+ Left Lt Pressure (mmHg)IndexWaveform Comment +---------+------------------+-----+----------+-------+ Brachial 169     +---------+------------------+-----+----------+-------+ PTA >254 1.50 monophasic   +---------+------------------+-----+----------+-------+ DP >254 1.50 monophasic  +---------+------------------+-----+----------+-------+ Great T oe40 0.24 Abnormal   +---------+------------------+-----+----------+-------+ Pedal arteries appear hyperemic. Patient refused Brachial pressure in the right arm. Summary: Right: Resting right ankle-brachial index indicates noncompressible right lower extremity arteries. The right toe-brachial index is abnormal. Left: Resting left ankle-brachial index indicates noncompressible left lower extremity arteries. The left toe-brachial index is abnormal. He is also having considerably more swelling in his left calf. This was not there when I last saw him 2 weeks ago. He tells me that some of the home health compression wraps have been slipping down and that may be the issue here however a month I am uncertain 30/8; sees vascular on December 22. Still complaining of a lot of pain. DVT study I did last time was negative for DVT 12/14; still complaining of pain which if this is arterial is certainly claudication and rest keeps him uncomfortable at night. He has an appointment with vein and vascular on December 22. Wound surface is better using Iodoflex. Once the surface of this is satisfactory and we have exhausted the vascular route. Perhaps an advanced treatment option. He has a configuration of the venous ulceration although his arterial studies are not very good. The other issue is the patient has a transplanted kidney. This will make angiography difficult and challenging issue 12/21; still complaining of pain and drainage. We are using Iodoflex on the wound under compression. He sees Dr. Donnetta Hutching tomorrow to evaluate his noninvasive studies noted above. He has a transplanted kidney further complicating the options for angiography. 12/28; still using Iodoflex under compression. I have Dr. Luther Parody note from 12/22. he noted arterial studies  revealing  monophasic waveforms at the pedal vessels bilaterally and calcified vessels making the ABI unreliable. He did not comment on the reduced TBI's. He felt these were venous wounds based on the palpable dorsalis pedis pulse. 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 And they arranged for formal venous duplex with reflux studies in the next several weeks. Follow-up with either Scot Dock or Dr. Oneida Alar 09/24/2019 still using Iodoflex under compression. He has an appointment with Dr. Doren Custard on 1/7 with regards to his venous disease. The patient was not felt to have a primary arterial problem for the nonhealing of his wound. We did attempt to wrap him with 3 layer when he first came into the clinic he complained of a lot of pain in the ankle although this may have been because the dressing fell down somewhat. He has far too edema fluid in the right leg for a good prognosis about healing this wound He comes in today with an excoriation on the bottom part of his right fifth toe. He thinks he may have done this taking off his clothes and that something got caught on the toe. There is no open wound however the toe itself is very painful 1/11; we are using Iodoflex under compression. The wound bed is clean. He went on to see Dr. Doren Custard on 09/27/2019 he again feels that the patient's arterial supply is adequate. He feels that he might benefit from right greater saphenous vein ablation for a venous ulcer. In the meantime the area that he was complaining about last week on the right fifth toe. An x-ray that I ordered showed marked soft tissue swelling along the distal aspect of the right foot but there was no evidence of osteomyelitis. He comes in today with the fifth toenail just about coming off. He has black eschar underneath the toe on the plantar aspect. The toe was swollen red and very painful. In the right setting this could be a significant soft tissue infection versus ischemia of the toe itself. He did not show  this to Dr. Doren Custard 1/18; I am using Iodoflex under compression a large wound on the right posterior calf. He had his right greater saphenous vein ablation by Dr. Doren Custard although I am not sure that is the only problem here. ooThe right fifth toe which was possibly trauma 2 weeks ago continues to be exceptionally painful with a necrotic tip. Maybe not quite as swollen. I started him empirically on Augmentin 5/125 1 p.o. twice daily last week after discussing this with Dr. Loletha Grayer of nephrology. Perhaps somewhat better this week but not as good as I was hoping. A plain x-ray was negative. He comes in today with an area on the medial right calf that was blistered and now open. In looking at things he appears to be systemically fluid volume overloaded. He has a transplanted kidney. He states he takes his Lasix variably when he has appointments he tries not to take it the later I am really not certain if he takes this reliably. However he has far too much edema fluid in the right leg to easily heal this wound and he appears to be developing blisters medially to form additional wounds 1/25; his CO2 angiogram was done by Dr. Doren Custard last week. This showed the proximal arteries all to be patent. On the right side the common femoral deep femoral superficial femoral popliteal anterior tibial and peroneal arteries were patent. The posterior tibial was occluded but reconstituted distally via collaterals from the peroneal  artery he was felt he should have enough blood flow to heal his wounds including the right fifth toe. The right fifth toe looks better however he is still complaining of a lot of pain. The large area which is a venous ulceration posteriorly has a better looking surface I think we can switch to Hydrofera Blue today 2/1; the patient's original wounds on the right posterior medial calf has come down in width however superior to this he has new denuded areas and I am concerned we simply do not have enough  edema control. He has already undergone a right greater saphenous ablation. He had a CO2 angiogram done by Dr. Doren Custard and the comment was that we should have enough blood flow to heal the venous wound however we simply do not seem to have enough edema control with 3 layer compression. The patient is status post kidney transplant although his exact renal function is not really clear. Nor am i sure what dose of diuretic he is supposed to be on. He also has the area on the right fifth toe which was unclear etiology but I think became secondarily infected I gave him 2 weeks of antibiotics for this and this seems to have settled down he still has a black eschar over the tip of the toe. X-ray was negative for fracture. He says he has a history of gout. 2/8; the patient's wound on the right posterior calf is about the same. The superficial area medially also about the same. He got a prescription for prednisoneo Gout after he developed erythema on the dorsal aspect of his left great toe going along with the right fifth toe which has been problematic all along. He has not taken it because he is concerned about increasing CBGs. He has a transplanted kidney is already on prednisone 5 mg. He would not be a good candidate for NSAIDs. Perhaps colchicine. He is not aware of what his uric acid level is 2/15; his right posterior calf wound seems to be coming in in terms of width. Everything here looks fairly good. No mechanical debridement we have been using Hydrofera Blue. He has had an ablation by vein and vascular. Felt to have adequate arterial supply to this area. The patient got prednisone last week from Dr. Angelique Holm of nephrology. At my urging he actually took it. The area on his left toe was a lot better. The right toe was not as painful but still erythematous with a wound at the tip. We have been using silver alginate here 2/22; right posterior calf seems to be gradually epithelializing. Still a fairly  substantial wound. He still has an area on the tip of his right fifth toe which I think was gout related. This is gradually improving. We have been using Unna boots to wrap X-ray I did of the foot last time was again negative there was soft tissue irregularity about the distal fifth digit but no radiographic evidence of bone damage 3/1; right posterior calf seems to be gradually improving however there were areas of hyper granulation. We have been using Hydrofera Blue. The hyper granulation is mostly evident in the most superior finger shape projection of the wound itself. ooThe area on the right fifth toe also was slough covered and required debridement. 3/8 continued improvement in the right posterior calf and the tip of the right fifth toe. We have been using Hydrofera Blue under compression he is changing the area to the toe 3/22; continued improvement in the right posterior calf.  Unfortunately comes in today with a new skin tear in the mid anterior tibia area this probably had something to do with changing his dressing home health I called and left this a message last week. 3/29; continued improvement in the a substantial wound on the right posterior calf. The skin tear anteriorly from last week is already healed on the tip of the right fifth toe there is still a nonviable area. 4/5; we have continued contraction of the substantial wound on the right posterior calf. We continue to have problems with the tip of the right fifth toe. We have been using Hydrofera Blue to both wound areas He is complaining about some discomfort in the Achilles area. T me he had surgery for what sounds like a torn Achilles about 10 years ago. ells 4/12; we have continued contraction of the substantial wound on the right posterior calf. ooStill an area on the tip of the right fifth toe. I changed him to silver collagen on the toe last week but I think home health continue to use Hydrofera Blue to both wound  areas. 4/19; we continue to have contraction of the substantial wound on the right posterior calf however there continues to be hyper granulation. ooThe area on the tip of the fifth toe is very small still some debris on the surface we have been using silver collagen 4/26; both wounds have contracted. I was hoping the fifth toe wound would close over but with probing there is still an open small hole here. 5/3; his wound on the right posterior calf which was his major area continues to contract looks healthy we have been using Hydrofera Blue. Using silver collagen to the fifth toe, not making a lot of progress here 5/10; right posterior calf wound continues to be smaller in terms of surface area and look healthy we have been using Hydrofera Blue here. The tip of the right fifth toe is still open requiring debridement. He has a new laceration on the right forearm he says he hit this on a microwave 6 days ago 02/11/20-The right fifth toe wound is closed, the right posterior calf wound looks healthy with smaller dimensions compared with last time, the right forearm area of laceration has a small blister which I try to open up with scalpel with very little fluid 6/7 the right fifth toe looks fine. Area on the right forearm is also closed. Right posterior calf wound continues to contract. Much smaller area remaining with healthy surface. We are using Hydrofera Blue under compression 6/21; area on the right posterior calf. There is only 1 small area in the most distal part of this. We have been using Hydrofera Blue under compression this is contracting surface looks healthy 6/28; right posterior calf this is healed. This was initially a hematoma. During the stay in this clinic he had a right greater saphenous vein ablation by Dr. Doren Custard of vein and vascular. He also had a difficult time with his right fifth toeo Infection versus gout. Very difficult time with this so that this has been closed for about 6  weeks. ------------------- READMISSION 04/29/20-Patient returns to the clinic this time with a right index finger swelling and pain been drainage, patient has been to the hand surgeon and vascular surgeon and ended up having a small IandD attempted in the office with expression of some gritty whitish material, patient also hit his hand and developed a small hematoma around this area. He also had vascular Dopplers done that show monophasic right  radial right ulnar waveforms. He has follow-up appointments with vascular surgery as well as the hand surgeon. His appointment today with a hand surgeon he was told that he may lose the tip of his index finger if surgery is done to open up the entire area. He is reluctant to have any surgery and would like to try conservative measures He has tried epsom salt soaks with improvement then worsening , he has just finished a course of antibiotics, he does not remember the name 8/16; patient was readmitted to the clinic last week and I am seeing this for the first time. Previously we had looked at this man for a large wound in the right posterior calf that was initially a hematoma. He went on to have a greater saphenous vein ablation. During the stay here he also had a difficult time with an area over his right fifth toe question infectious question gout at that time. This eventually closed over although I do not think the differential was ever really totally clear in my mind. The patient is here this time with a necrotic area on the palmar surface of his right index finger. He states this started in July with a small red area. He spent some time going to see Dr. Jarome Matin of dermatology. Ended up with Dr. 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 He is also seen hand surgery which is fortunate is he is likely to require either debridement or amputation of this finger. He had a course of antibiotics Keflex and  doxycycline I believe but he is finished these. Currently using Betadine. He describes this is very painful. I have not had a chance to look back over notes in care everywhere. I would like to see as hand surgery notes, vascular surgery notes and dermatology notes if this is possible. He also apparently had an x-ray ordered by Dr. Amedeo Plenty that I would like to review. 3/15; certainly no improvement in his right index finger palmar aspect. Culture I did last time showed abundant Enterobacter and abundant Proteus. I have him on cefdinir 300 mg every 24 as of Friday in response to this. This was a deep tissue culture. 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 He still complaining of a lot of pain. READMISSION 07/04/2020 Mr. Vanpatten is a 74 year old man with wounds on his bilateral lower extremities and bilateral upper extremities which were apparently the consequence of being found down at home in early September. He required admission to the hospital from 9/6 through 9/17 with delirium, hyponatremia, sepsis and type 2 diabetes. He gradually improved. He was sent to a nursing home in [place] and recently has been discharged home. They are using some foam-based dressings on his bilateral lower and upper extremity wounds as well as his left fifth toe which apparently are felt to be secondary from the prolonged friction and pressure of being recumbent. We have had Mr. Carreno in the clinic on 2 different occasions. Predominantly the first time for a large wound on the right posterior calf related to trauma and chronic venous insufficiency. He was here in August with a necrotic wound on the tip of his right index finger which ultimately required amputation this is still currently wrapped. Past medical history most importantly includes type 2 diabetes with a prior kidney transplant, atrial fibrillation, heart failure with preserved with  preserved ejection fraction, chronic prednisone use, he says his Lasix was held when he  was in skilled nursing and only recently been started by Dr. Vanita Panda of nephrology He has not been previously felt to have an arterial issue previous ABIs were noncompressible 10/22; readmitted to the clinic last week. His wounds on the upper extremities are healed as well as the right lower extremity. He still has an area on the left lateral calf and the lateral part of his fifth toe. We have been using silver alginate. He looks like he has had a lot of edema improvement with the recent addition to his diuretics by Dr. Cheri Kearns of nephrology Objective Constitutional Patient is hypertensive.. Pulse regular and within target range for patient.Marland Kitchen Respirations regular, non-labored and within target range.. Temperature is normal and within the target range for the patient.Marland Kitchen Appears in no distress. Vitals Time Taken: 8:52 AM, Height: 68 in, Weight: 192 lbs, BMI: 29.2, Temperature: 98.2 F, Pulse: 53 bpm, Respiratory Rate: 19 breaths/min, Blood Pressure: 164/84 mmHg. Cardiovascular Much better edema control. General Notes: Wound exam ooThe major areas on the left lateral calf. Still not a viable surface. This was debrided with a #5 curette I am able to get this to clean up a lot better this week. Also the area on the lateral aspect of the left fifth toe. This was also debrided to get necrotic debris off the surface and I am able to get to a much better surface in both wound areas. Integumentary (Hair, Skin) Wound #12 status is Open. Original cause of wound was Trauma. The wound is located on the Left,Lateral Lower Leg. The wound measures 2.5cm length x 1.5cm width x 0.1cm depth; 2.945cm^2 area and 0.295cm^3 volume. There is Fat Layer (Subcutaneous Tissue) exposed. There is no tunneling or undermining noted. There is a medium amount of serosanguineous drainage noted. The wound margin is distinct with the  outline attached to the wound base. There is medium (34-66%) pink granulation within the wound bed. There is a medium (34-66%) amount of necrotic tissue within the wound bed including Adherent Slough. Wound #13 status is Open. Original cause of wound was Gradually Appeared. The wound is located on the Left T Fifth. The wound measures 1cm length x oe 0.4cm width x 0.1cm depth; 0.314cm^2 area and 0.031cm^3 volume. There is no tunneling or undermining noted. There is a medium amount of serosanguineous drainage noted. The wound margin is distinct with the outline attached to the wound base. There is no granulation within the wound bed. There is a large (67- 100%) amount of necrotic tissue within the wound bed including Eschar and Adherent Slough. Wound #14 status is Healed - Epithelialized. Original cause of wound was Trauma. The wound is located on the Right,Lateral Lower Leg. The wound measures 0cm length x 0cm width x 0cm depth; 0cm^2 area and 0cm^3 volume. There is no tunneling or undermining noted. There is a none present amount of drainage noted. The wound margin is distinct with the outline attached to the wound base. There is no granulation within the wound bed. There is no necrotic tissue within the wound bed. Wound #15 status is Healed - Epithelialized. Original cause of wound was Trauma. The wound is located on the Left Elbow. The wound measures 0cm length x 0cm width x 0cm depth; 0cm^2 area and 0cm^3 volume. There is no tunneling or undermining noted. There is a none present amount of drainage noted. The wound margin is distinct with the outline attached to the wound base. There is no granulation within the wound bed. There is no necrotic  tissue within the wound bed. Wound #16 status is Healed - Epithelialized. Original cause of wound was Trauma. The wound is located on the Left,Anterior Forearm. The wound measures 0cm length x 0cm width x 0cm depth; 0cm^2 area and 0cm^3 volume. There is no  tunneling or undermining noted. There is a none present amount of drainage noted. The wound margin is distinct with the outline attached to the wound base. There is no granulation within the wound bed. There is no necrotic tissue within the wound bed. Wound #17 status is Healed - Epithelialized. Original cause of wound was Trauma. The wound is located on the Right Forearm. The wound measures 0cm length x 0cm width x 0cm depth; 0cm^2 area and 0cm^3 volume. There is no tunneling or undermining noted. There is a none present amount of drainage noted. The wound margin is distinct with the outline attached to the wound base. There is no granulation within the wound bed. There is no necrotic tissue within the wound bed. Assessment Active Problems ICD-10 Non-pressure chronic ulcer of left calf with fat layer exposed Non-pressure chronic ulcer of other part of left foot limited to breakdown of skin Non-pressure chronic ulcer of right calf limited to breakdown of skin Abrasion of left upper arm, subsequent encounter Abrasion of right upper arm, subsequent encounter Type 2 diabetes mellitus with other skin ulcer Chronic venous hypertension (idiopathic) with inflammation of bilateral lower extremity Procedures Wound #12 Pre-procedure diagnosis of Wound #12 is a Diabetic Wound/Ulcer of the Lower Extremity located on the Left,Lateral Lower Leg .Severity of Tissue Pre Debridement is: Fat layer exposed. There was a Excisional Skin/Subcutaneous Tissue Debridement with a total area of 3.75 sq cm performed by Ricard Dillon., MD. With the following instrument(s): Curette to remove Viable and Non-Viable tissue/material. Material removed includes Subcutaneous Tissue and Slough and after achieving pain control using Other (benzocaine 20% spray). No specimens were taken. A time out was conducted at 09:25, prior to the start of the procedure. A Minimum amount of bleeding was controlled with Pressure. The  procedure was tolerated well with a pain level of 6 throughout and a pain level of 2 following the procedure. Post Debridement Measurements: 2.5cm length x 1.5cm width x 0.1cm depth; 0.295cm^3 volume. Character of Wound/Ulcer Post Debridement is improved. Severity of Tissue Post Debridement is: Fat layer exposed. 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 There was a Three Layer Compression Therapy Procedure by [name], RN. Post procedure Diagnosis Wound #12: Same as Pre-Procedure Wound #13 Pre-procedure diagnosis of Wound #13 is a Diabetic Wound/Ulcer of the Lower Extremity located on the Left T Fifth .Severity of Tissue Pre Debridement is: oe Fat layer exposed. There was a Excisional Skin/Subcutaneous Tissue Debridement with a total area of 0.4 sq cm performed by Ricard Dillon., MD. With the following instrument(s): Curette to remove Viable and Non-Viable tissue/material. Material removed includes Eschar, Subcutaneous Tissue, and Slough after achieving pain control using Other (benzocaine 20% spray). No specimens were taken. A time out was conducted at 09:25, prior to the start of the procedure. A Minimum amount of bleeding was controlled with Pressure. The procedure was tolerated well with a pain level of 6 throughout and a pain level of 2 following the procedure. Post Debridement Measurements: 1cm length x 0.4cm width x 0.1cm depth; 0.031cm^3 volume. Character of Wound/Ulcer Post Debridement is improved. Severity of Tissue  Post Debridement is: Fat layer exposed. Post procedure Diagnosis Wound #13: Same as Pre-Procedure Plan Follow-up Appointments: Return Appointment in 1 week. Dressing Change Frequency: Wound #12 Left,Lateral Lower Leg: Other: - 2 times per week (once at wound center, once at wound center) all wounds Wound #13 Left T  Fifth: oe Other: - 2 times per week (once at wound center, once at wound center) all wounds Skin Barriers/Peri-Wound Care: Moisturizing lotion - to left leg with dressing changes and right leg daily Wound Cleansing: May shower with protection. May shower and wash wound with soap and water. - on days when dressings are changed Primary Wound Dressing: Wound #12 Left,Lateral Lower Leg: Calcium Alginate with Silver Wound #13 Left T Fifth: oe Calcium Alginate with Silver Secondary Dressing: Wound #12 Left,Lateral Lower Leg: Dry Gauze Wound #13 Left T Fifth: oe Kerlix/Rolled Gauze Dry Gauze Edema Control: 3 Layer Compression System - Left Lower Extremity Avoid standing for long periods of time Elevate legs to the level of the heart or above for 30 minutes daily and/or when sitting, a frequency of: - whenever sitting throughout the day Exercise regularly Support Garment 20-30 mm/Hg pressure to: - compression stocking to right leg daily, apply in first thing in the morning and remove at night Home Health: [facility] for wound care. - [facility]. Everything is healed except for the 2 areas on the left including the left lateral leg and the lateral part of the left fifth toe. I was able to get to a much better debridement this week on these and the surface is clean up quite nicely. 2. I am going to continue with silver alginate 3. If peripheral pulses are palpable. 4. Still in compression on the left leg3 layer. I have given him permission to use his own stocking on the right leg. I really hope this is not the wrong decision here although he should have had a good stocking from his previous stays in this clinic. We will look at this next week with regards to whether it needs to be replaced. Electronic Signature(s) Signed: 07/11/2020 4:52:36 PM By: [name] MD Entered By: [name] on 07/11/2020  09:39:51 -------------------------------------------------------------------------------- SuperBill Details Patient Name: Date of Service: RONNIE, DOO HA [name] 07/11/2020 Medical Record Number: [id number] Patient Account Number: [id number] Date of Birth/Sex: Treating RN: 02/05/1946 (74 y.o. [name] Primary Care Provider: [name] Other Clinician: Referring Provider: Treating Provider/Extender: [name] in Treatment: 1 Diagnosis Coding ICD-10 Codes Code Description [phone] Non-pressure chronic ulcer of left calf with fat layer exposed 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 of skin L97.211 Non-pressure chronic ulcer of right calf limited to breakdown of skin S40.812D Abrasion of left upper arm, subsequent encounter S40.811D Abrasion of right upper arm, subsequent encounter E11.622 Type 2 diabetes mellitus with other skin ulcer I87.323 Chronic venous hypertension (idiopathic) with inflammation of bilateral lower extremity Facility Procedures Physician Procedures : CPT4 Code Description Modifier [id number] - WC PHYS SUBQ TISS 20 SQ CM ICD-10 Diagnosis Description 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

## 2020-07-11 NOTE — Progress Notes (Signed)
ISSAK, GOLEY ([id number]) Visit Report for 07/11/2020 Arrival Information Details Patient Name: Date of Service: [name], [name] 07/11/2020 8:45 [name] Medical Record Number: [id number] Patient Account Number: [id number] Date of Birth/Sex: Treating RN: January 05, 1946 (74 y.o. [name] Primary Care Jeni Duling: [name] Other Clinician: Referring Camar Guyton: Treating Shamonica Schadt/Extender: [name] in Treatment: 1 Visit Information History Since Last Visit Added or deleted any medications: No Patient Arrived: Ambulatory Any new allergies or adverse reactions: No Arrival Time: 08:51 Had a fall or experienced change in No Accompanied By: self activities of daily living that may affect Transfer Assistance: None risk of falls: Patient Identification Verified: Yes Signs or symptoms of abuse/neglect since last visito No Secondary Verification Process Completed: Yes Hospitalized since last visit: No Patient Requires Transmission-Based Precautions: No Implantable device outside of the clinic excluding No Patient Has Alerts: No cellular tissue based products placed in the center since last visit: Has Dressing in Place as Prescribed: Yes Has Compression in Place as Prescribed: Yes Pain Present Now: No Electronic Signature(s) Signed: 07/11/2020 5:00:52 PM By: Kela Millin Entered By: Kela Millin on 07/11/2020 08:52:23 -------------------------------------------------------------------------------- Compression Therapy Details Patient Name: Date of Service: [name], [name] 07/11/2020 8:45 [name] Medical Record Number: [id number] Patient Account Number: [id number] Date of Birth/Sex: Treating RN: December 04, 1945 (74 y.o. [name] Primary Care Walker Paddack: [name] Other Clinician: Referring Claxton Levitz: Treating Maycol Hoying/Extender: [name] in Treatment: 1 Compression Therapy  Performed for Wound Assessment: Wound #12 Left,Lateral Lower Leg Performed By: Clinician [name], RN Compression Type: Three Layer Post Procedure Diagnosis Same as Pre-procedure Electronic Signature(s) Signed: 07/11/2020 5:21:31 PM By: [name] RN, BSN Entered By: [name] on 07/11/2020 43:32:95 -------------------------------------------------------------------------------- Encounter Discharge Information Details Patient Name: Date of Service: [name], [name] 07/11/2020 8:45 [name] Medical Record Number: [id number] Patient Account Number: [id number] Date of Birth/Sex: Treating RN: 1945/12/31 (74 y.o. [name] Primary Care Dylann Gallier: [name] Other Clinician: Referring Tashala Cumbo: Treating Kourtnee Lahey/Extender: [name] in Treatment: 1 Encounter Discharge Information Items Post Procedure Vitals Discharge Condition: Stable Temperature (F): 98.3 Ambulatory Status: Ambulatory Pulse (bpm): 53 Discharge Destination: Home Respiratory Rate (breaths/min): 19 Transportation: Private Auto Blood Pressure (mmHg): 164/84 Accompanied By: self Schedule Follow-up Appointment: Yes Clinical Summary of Care: Electronic Signature(s) Signed: 07/11/2020 5:18:39 PM By: [name] Entered By: [name] on 07/11/2020 09:53:42 -------------------------------------------------------------------------------- Lower Extremity Assessment Details Patient Name: Date of Service: [name], [name] 07/11/2020 8:45 [name] Medical Record Number: [id number] Patient Account Number: [id number] Date of Birth/Sex: Treating RN: 1946/07/25 (74 y.o. [name] Primary Care Icarus Partch: [name] Other Clinician: Referring Midge Momon: Treating Jovonne Wilton/Extender: [name] in Treatment: 1 Edema Assessment Assessed: Shirlyn Goltz: No] [Right: No] E[Left: dema] [Right: :] Calf Left: Right: Point  of Measurement: 42 cm From Medial Instep 41.5 cm 35 cm Ankle Left: Right: Point of Measurement: 18 cm From Medial Instep 22.5 cm 22.5 cm Vascular Assessment Pulses: Dorsalis Pedis Palpable: [Left:No] [Right:No] Electronic Signature(s) Signed: 07/11/2020 5:00:52 PM By: Kela Millin Entered By: Kela Millin on 07/11/2020 08:59:51 -------------------------------------------------------------------------------- Multi Wound Chart Details Patient Name: Date of Service: [name], [name] 07/11/2020 8:45 [name] Medical Record Number: [id number] Patient Account Number: [id number] Date of Birth/Sex: Treating RN: [birth year] (74 y.o. [name] Primary Care Gisella Alwine: [name] Other Clinician: Referring Kathrina Crosley: Treating Mackenzi Krogh/Extender: Ronny Flurry  A Weeks in Treatment: 1 Vital Signs Height(in): 68 Pulse(bpm): 53 Weight(lbs): 192 Blood Pressure(mmHg): 164/84 Body Mass Index(BMI): 29 Temperature(F): 98.2 Respiratory Rate(breaths/min): 19 Photos: [12:No Photos Left, Lateral Lower Leg] [13:No Photos Left T Fifth oe] [14:No Photos Right, Lateral Lower Leg] Wound Location: [12:Trauma] [13:Gradually Appeared] [14:Trauma] Wounding Event: [12:Diabetic Wound/Ulcer of the Lower] [13:Diabetic Wound/Ulcer of the Lower] [14:Diabetic Wound/Ulcer of the Lower] Primary Etiology: [12:Extremity Cataracts, Anemia, Arrhythmia,] [13:Extremity Cataracts, Anemia, Arrhythmia,] [14:Extremity Cataracts, Anemia, Arrhythmia,] Comorbid History: [12:Congestive Heart Failure, Hypertension, Type II Diabetes, Gout, Hypertension, Type II Diabetes, Gout, Neuropathy 05/26/2020] [13:Congestive Heart Failure, Neuropathy 06/18/2020] [14:Congestive Heart Failure, Hypertension, Type II  Diabetes, Gout, Neuropathy 05/26/2020] Date Acquired: [12:1] [13:1] [14:1] Weeks of Treatment: [12:Open] [13:Open] [14:Healed - Epithelialized] Wound Status: [12:2.5x1.5x0.1] [13:1x0.4x0.1]  [14:0x0x0] Measurements L x W x D (cm) [12:2.945] [13:0.314] [14:0] A (cm) : rea [12:0.295] [13:0.031] [14:0] Volume (cm) : [12:14.80%] [13:44.40%] [14:100.00%] % Reduction in A [12:rea: 78.70%] [13:45.60%] [14:100.00%] % Reduction in Volume: [12:Grade 2] [13:Grade 2] [14:Grade 2] Classification: [12:Medium] [13:Medium] [14:None Present] Exudate A mount: [12:Serosanguineous] [13:Serosanguineous] [14:N/A] Exudate Type: [12:red, brown] [13:red, brown] [14:N/A] Exudate Color: [12:Distinct, outline attached] [13:Distinct, outline attached] [14:Distinct, outline attached] Wound Margin: [12:Medium (34-66%)] [13:None Present (0%)] [14:None Present (0%)] Granulation A mount: [12:Pink] [13:N/A] [14:N/A] Granulation Quality: [12:Medium (34-66%)] [13:Large (67-100%)] [14:None Present (0%)] Necrotic A mount: [12:Adherent Slough] [13:Eschar, Adherent Slough] [14:N/A] Necrotic Tissue: [12:Fat Layer (Subcutaneous Tissue): Yes Fascia: No] [14:Fascia: No] Exposed Structures: [12:Fascia: No Tendon: No Muscle: No Joint: No Bone: No None] [13:Fat Layer (Subcutaneous Tissue): No Tendon: No Muscle: No Joint: No Bone: No None] [14:Fat Layer (Subcutaneous Tissue): No Tendon: No Muscle: No Joint: No Bone: No Large (67-100%)] Epithelialization: [12:Debridement - Excisional] [13:Debridement - Excisional] [14:N/A] Debridement: Pre-procedure Verification/Time Out 09:25 [13:09:25] [14:N/A] Taken: [12:Other] [13:Other] [14:N/A] Pain Control: [12:Subcutaneous, Slough] [13:Necrotic/Eschar, Subcutaneous,] [14:N/A] Tissue Debrided: [12:Skin/Subcutaneous Tissue] [13:Slough Skin/Subcutaneous Tissue] [14:N/A] Level: [12:3.75] [13:0.4] [14:N/A] Debridement A (sq cm): [12:rea Curette] [13:Curette] [14:N/A] Instrument: [12:Minimum] [13:Minimum] [14:N/A] Bleeding: [12:Pressure] [13:Pressure] [14:N/A] Hemostasis Achieved: [12:6] [13:6] [14:N/A] Procedural Pain: [12:2] [13:2] [14:N/A] Post Procedural Pain: [12:Procedure was  tolerated well] [13:Procedure was tolerated well] [14:N/A] Debridement Treatment Response: [12:2.5x1.5x0.1] [13:1x0.4x0.1] [14:N/A] Post Debridement Measurements L x W x D (cm) [12:0.295] [13:0.031] [14:N/A] Post Debridement Volume: (cm) [12:Compression Therapy] [13:Debridement] [14:N/A] Procedures Performed: [12:Debridement 15] [13:16] [14:17] Photos: [12:No Photos Left Elbow] [13:No Photos Left, Anterior Forearm] [14:No Photos Right Forearm] Wound Location: [12:Trauma] [13:Trauma] [14:Trauma] Wounding Event: [12:Skin T ear] [13:Skin T ear] [14:Skin T ear] Primary Etiology: [12:Cataracts, Anemia, Arrhythmia,] [13:Cataracts, Anemia, Arrhythmia,] [14:Cataracts, Anemia, Arrhythmia,] Comorbid History: [12:Congestive Heart Failure, Hypertension, Type II Diabetes, Gout, Neuropathy 05/26/2020] [13:Congestive Heart Failure, Hypertension, Type II Diabetes, Gout, Neuropathy 05/26/2020] [14:Congestive Heart Failure, Hypertension, Type II  Diabetes, Gout, Neuropathy 05/26/2020] Date A cquired: [12:1] [13:1] [14:1] Weeks of Treatment: [12:Healed - Epithelialized] [13:Healed - Epithelialized] [14:Healed - Epithelialized] Wound Status: [12:0x0x0] [13:0x0x0] [14:0x0x0] Measurements L x W x D (cm) [12:0] [13:0] [14:0] A (cm) : rea [12:0] [13:0] [14:0] Volume (cm) : [12:100.00%] [13:100.00%] [14:100.00%] % Reduction in A [12:rea: 100.00%] [13:100.00%] [14:100.00%] % Reduction in Volume: [12:Full Thickness Without Exposed] [13:Full Thickness Without Exposed] [14:Full Thickness Without Exposed] Classification: [12:Support Structures None Present] [13:Support Structures None Present] [14:Support Structures None Present] Exudate A mount: [12:N/A] [13:N/A] [14:N/A] Exudate Type: [12:N/A] [13:N/A] [14:N/A] Exudate Color: [12:Distinct, outline attached] [13:Distinct, outline attached] [14:Distinct, outline attached] Wound Margin: [12:None Present (0%)] [13:None Present (0%)] [14:None Present (0%)] Granulation A  mount: [12:N/A] [13:N/A] [14:N/A] Granulation Quality: [12:None Present (  0%)] [13:None Present (0%)] [14:None Present (0%)] Necrotic A mount: [12:N/A] [13:N/A] [14:N/A] Necrotic Tissue: [12:Fascia: No] [13:Fascia: No] [14:Fascia: No] Exposed Structures: [12:Fat Layer (Subcutaneous Tissue): No Tendon: No Muscle: No Joint: No Bone: No Large (67-100%)] [13:Fat Layer (Subcutaneous Tissue): No Tendon: No Muscle: No Joint: No Bone: No Large (67-100%)] [14:Fat Layer (Subcutaneous Tissue): No Tendon: No Muscle:  No Joint: No Bone: No Large (67-100%)] Epithelialization: [12:N/A] [13:N/A] [14:N/A] Debridement: [12:N/A] [13:N/A] [14:N/A] Pain Control: [12:N/A] [13:N/A] [14:N/A] Tissue Debrided: [12:N/A] [13:N/A] [14:N/A] Level: [12:N/A] [13:N/A] [14:N/A] Debridement A (sq cm): [12:rea N/A] [13:N/A] [14:N/A] Instrument: [12:N/A] [13:N/A] [14:N/A] Bleeding: [12:N/A] [13:N/A] [14:N/A] Hemostasis A chieved: [12:N/A] [13:N/A] [14:N/A] Procedural Pain: [12:N/A] [13:N/A] [14:N/A] Post Procedural Pain: Debridement Treatment Response: N/A [13:N/A] [14:N/A] Post Debridement Measurements L x N/A [13:N/A] [14:N/A] W x D (cm) [12:N/A] [13:N/A] [14:N/A] Post Debridement Volume: (cm) [12:N/A] [13:N/A] [14:N/A] Treatment Notes Electronic Signature(s) Signed: 07/11/2020 4:52:36 PM By: [name] MD Signed: 07/11/2020 5:21:31 PM By: [name] RN, BSN Entered By: [name] on 07/11/2020 09:35:09 -------------------------------------------------------------------------------- Multi-Disciplinary Care Plan Details Patient Name: Date of Service: [name], [name] 07/11/2020 8:45 [name] Medical Record Number: [id number] Patient Account Number: [id number] Date of Birth/Sex: Treating RN: 1946-04-10 (74 y.o. [name] Primary Care Sabreena Vogan: [name] Other Clinician: Referring Kayti Poss: Treating Saleen Peden/Extender: [name] in Treatment:  1 Active Inactive Abuse / Safety / Falls / Self Care Management Nursing Diagnoses: Potential for falls Goals: Patient/caregiver will verbalize/demonstrate measures taken to prevent injury and/or falls Date Initiated: 07/04/2020 Target Resolution Date: 08/01/2020 Goal Status: Active Interventions: Assess fall risk on admission and as needed Assess impairment of mobility on admission and as needed per policy Notes: Nutrition Nursing Diagnoses: Impaired glucose control: actual or potential Potential for alteratiion in Nutrition/Potential for imbalanced nutrition Goals: Patient/caregiver will maintain therapeutic glucose control Date Initiated: 07/04/2020 Target Resolution Date: 08/01/2020 Goal Status: Active Interventions: Assess HgA1c results as ordered upon admission and as needed Assess patient nutrition upon admission and as needed per policy Treatment Activities: Patient referred to Primary Care Physician for further nutritional evaluation : 07/04/2020 Notes: Venous Leg Ulcer Nursing Diagnoses: Knowledge deficit related to disease process and management Potential for venous Insuffiency (use before diagnosis confirmed) Goals: Patient will maintain optimal edema control Date Initiated: 07/04/2020 Target Resolution Date: 08/01/2020 Goal Status: Active Interventions: Assess peripheral edema status every visit. Compression as ordered Provide education on venous insufficiency Treatment Activities: Therapeutic compression applied : 07/04/2020 Notes: Wound/Skin Impairment Nursing Diagnoses: Impaired tissue integrity Knowledge deficit related to ulceration/compromised skin integrity Goals: Patient/caregiver will verbalize understanding of skin care regimen Date Initiated: 07/04/2020 Target Resolution Date: 08/01/2020 Goal Status: Active Ulcer/skin breakdown will have a volume reduction of 30% by week 4 Date Initiated: 07/04/2020 Target Resolution Date:  08/01/2020 Goal Status: Active Interventions: Assess patient/caregiver ability to obtain necessary supplies Assess patient/caregiver ability to perform ulcer/skin care regimen upon admission and as needed Assess ulceration(s) every visit Provide education on ulcer and skin care Treatment Activities: Skin care regimen initiated : 07/04/2020 Topical wound management initiated : 07/04/2020 Notes: Electronic Signature(s) Signed: 07/11/2020 5:21:31 PM By: [name] RN, BSN Entered By: [name] on 07/11/2020 09:21:58 -------------------------------------------------------------------------------- Pain Assessment Details Patient Name: Date of Service: [name], [name] 07/11/2020 8:45 [name] Medical Record Number: [id number] Patient Account Number: [id number] Date of Birth/Sex: Treating RN: [birth year] (74 y.o. [name] Primary Care Carless Slatten: [name] Other Clinician: Referring Jaymz Traywick: Treating Soleia Badolato/Extender: Ronny Flurry  A Weeks in Treatment: 1 Active Problems Location of Pain Severity and Description of Pain Patient Has Paino No Site Locations Pain Management and Medication Current Pain Management: Electronic Signature(s) Signed: 07/11/2020 5:00:52 PM By: Kela Millin Entered By: Kela Millin on 07/11/2020 08:54:05 -------------------------------------------------------------------------------- Patient/Caregiver Education Details Patient Name: Date of Service: [name] HA [name] 10/22/2021andnbsp8:45 [name] Medical Record Number: [id number] Patient Account Number: [id number] Date of Birth/Gender: Treating RN: [birth year] (74 y.o. [name] Primary Care Physician: [name] Other Clinician: Referring Physician: Treating Physician/Extender: [name] in Treatment: 1 Education Assessment Education Provided To: Patient Education Topics  Provided Safety: Methods: Explain/Verbal Responses: Reinforcements needed, State content correctly Venous: Methods: Explain/Verbal Responses: Reinforcements needed, State content correctly Wound/Skin Impairment: Methods: Explain/Verbal Responses: Reinforcements needed, State content correctly Electronic Signature(s) Signed: 07/11/2020 5:21:31 PM By: [name] RN, BSN Entered By: [name] on 07/11/2020 09:22:33 -------------------------------------------------------------------------------- Wound Assessment Details Patient Name: Date of Service: [name], [name] 07/11/2020 8:45 [name] Medical Record Number: [id number] Patient Account Number: [id number] Date of Birth/Sex: Treating RN: [birth year] (74 y.o. [name] Primary Care Avin Gibbons: [name] Other Clinician: Referring Aneudy Champlain: Treating Dillon Mcreynolds/Extender: [name] in Treatment: 1 Wound Status Wound Number: 12 Primary Diabetic Wound/Ulcer of the Lower Extremity Etiology: Wound Location: Left, Lateral Lower Leg Wound Open Wounding Event: Trauma Status: Date Acquired: 05/26/2020 Comorbid Cataracts, Anemia, Arrhythmia, Congestive Heart Failure, Weeks Of Treatment: 1 History: Hypertension, Type II Diabetes, Gout, Neuropathy Clustered Wound: No Wound Measurements Length: (cm) 2.5 Width: (cm) 1.5 Depth: (cm) 0.1 Area: (cm) 2.945 Volume: (cm) 0.295 % Reduction in Area: 14.8% % Reduction in Volume: 78.7% Epithelialization: None Tunneling: No Undermining: No Wound Description Classification: Grade 2 Wound Margin: Distinct, outline attached Exudate Amount: Medium Exudate Type: Serosanguineous Exudate Color: red, brown Foul Odor After Cleansing: No Slough/Fibrino Yes Wound Bed Granulation Amount: Medium (34-66%) Exposed Structure Granulation Quality: Pink Fascia Exposed: No Necrotic Amount: Medium (34-66%) Fat Layer (Subcutaneous Tissue)  Exposed: Yes Necrotic Quality: Adherent Slough Tendon Exposed: No Muscle Exposed: No Joint Exposed: No Bone Exposed: No Treatment Notes Wound #12 (Left, Lateral Lower Leg) 1. Cleanse With Wound Cleanser Soap and water 2. Periwound Care Moisturizing lotion 3. Primary Dressing Applied Calcium Alginate Ag 4. Secondary Dressing Dry Gauze 6. Support Layer Applied 3 layer compression wrap Electronic Signature(s) Signed: 07/11/2020 5:00:52 PM By: Kela Millin Entered By: Kela Millin on 07/11/2020 09:00:24 -------------------------------------------------------------------------------- Wound Assessment Details Patient Name: Date of Service: [name], [name] 07/11/2020 8:45 [name] Medical Record Number: [id number] Patient Account Number: [id number] Date of Birth/Sex: Treating RN: [birth year] (74 y.o. [name] Primary Care Mikka Kissner: [name] Other Clinician: Referring Devonn Giampietro: Treating Andreina Outten/Extender: [name] in Treatment: 1 Wound Status Wound Number: 13 Primary Diabetic Wound/Ulcer of the Lower Extremity Etiology: Wound Location: Left T Fifth oe Wound Open Wounding Event: Gradually Appeared Status: Date Acquired: 06/18/2020 Comorbid Cataracts, Anemia, Arrhythmia, Congestive Heart Failure, Weeks Of Treatment: 1 History: Hypertension, Type II Diabetes, Gout, Neuropathy Clustered Wound: No Wound Measurements Length: (cm) 1 Width: (cm) 0.4 Depth: (cm) 0.1 Area: (cm) 0.314 Volume: (cm) 0.031 % Reduction in Area: 44.4% % Reduction in Volume: 45.6% Epithelialization: None Tunneling: No Undermining: No Wound Description Classification: Grade 2 Wound Margin: Distinct, outline attached Exudate Amount: Medium Exudate Type: Serosanguineous Exudate Color: red, brown Foul Odor After Cleansing: No Slough/Fibrino Yes Wound Bed Granulation Amount: None Present (0%) Exposed Structure Necrotic  Amount:  Large (67-100%) Fascia Exposed: No Necrotic Quality: Eschar, Adherent Slough Fat Layer (Subcutaneous Tissue) Exposed: No Tendon Exposed: No Muscle Exposed: No Joint Exposed: No Bone Exposed: No Treatment Notes Wound #13 (Left Toe Fifth) 1. Cleanse With Wound Cleanser Soap and water 3. Primary Dressing Applied Calcium Alginate Ag 4. Secondary Dressing Dry Gauze Roll Gauze 5. Secured With [employer]) Signed: 07/11/2020 5:00:52 PM By: Kela Millin Entered By: Kela Millin on 07/11/2020 09:00:58 -------------------------------------------------------------------------------- Wound Assessment Details Patient Name: Date of Service: [name], [name] 07/11/2020 8:45 [name] Medical Record Number: [id number] Patient Account Number: [id number] Date of Birth/Sex: Treating RN: June 05, 1946 (74 y.o. [name] Primary Care Crystalle Popwell: [name] Other Clinician: Referring Chayanne Speir: Treating Espyn Radwan/Extender: [name] in Treatment: 1 Wound Status Wound Number: 14 Primary Diabetic Wound/Ulcer of the Lower Extremity Etiology: Wound Location: Right, Lateral Lower Leg Wound Healed - Epithelialized Wounding Event: Trauma Status: Date Acquired: 05/26/2020 Comorbid Cataracts, Anemia, Arrhythmia, Congestive Heart Failure, Weeks Of Treatment: 1 History: Hypertension, Type II Diabetes, Gout, Neuropathy Clustered Wound: No Wound Measurements Length: (cm) Width: (cm) Depth: (cm) Area: (cm) Volume: (cm) 0 % Reduction in Area: 100% 0 % Reduction in Volume: 100% 0 Epithelialization: Large (67-100%) 0 Tunneling: No 0 Undermining: No Wound Description Classification: Grade 2 Wound Margin: Distinct, outline attached Exudate Amount: None Present Foul Odor After Cleansing: No Slough/Fibrino No Wound Bed Granulation Amount: None Present (0%) Exposed Structure Necrotic Amount: None Present  (0%) Fascia Exposed: No Fat Layer (Subcutaneous Tissue) Exposed: No Tendon Exposed: No Muscle Exposed: No Joint Exposed: No Bone Exposed: No Electronic Signature(s) Signed: 07/11/2020 5:00:52 PM By: Kela Millin Entered By: Kela Millin on 07/11/2020 09:01:29 -------------------------------------------------------------------------------- Wound Assessment Details Patient Name: Date of Service: [name], HOWAT HA [name] 07/11/2020 8:45 [name] Medical Record Number: [id number] Patient Account Number: [id number] Date of Birth/Sex: Treating RN: 1945-11-20 (74 y.o. [name] Primary Care Fenna Semel: [name] Other Clinician: Referring Armelia Penton: Treating Bera Pinela/Extender: [name] in Treatment: 1 Wound Status Wound Number: 15 Primary Skin Tear Etiology: Wound Location: Left Elbow Wound Healed - Epithelialized Wounding Event: Trauma Status: Date Acquired: 05/26/2020 Comorbid Cataracts, Anemia, Arrhythmia, Congestive Heart Failure, Weeks Of Treatment: 1 History: Hypertension, Type II Diabetes, Gout, Neuropathy Clustered Wound: No Wound Measurements Length: (cm) Width: (cm) Depth: (cm) Area: (cm) Volume: (cm) 0 % Reduction in Area: 100% 0 % Reduction in Volume: 100% 0 Epithelialization: Large (67-100%) 0 Tunneling: No 0 Undermining: No Wound Description Classification: Full Thickness Without Exposed Support Structures Wound Margin: Distinct, outline attached Exudate Amount: None Present Foul Odor After Cleansing: No Slough/Fibrino No Wound Bed Granulation Amount: None Present (0%) Exposed Structure Necrotic Amount: None Present (0%) Fascia Exposed: No Fat Layer (Subcutaneous Tissue) Exposed: No Tendon Exposed: No Muscle Exposed: No Joint Exposed: No Bone Exposed: No Electronic Signature(s) Signed: 07/11/2020 5:00:52 PM By: Kela Millin Entered By: Kela Millin on 07/11/2020  09:02:00 -------------------------------------------------------------------------------- Wound Assessment Details Patient Name: Date of Service: ALEE, KATEN HA [name] 07/11/2020 8:45 [name] Medical Record Number: [id number] Patient Account Number: [id number] Date of Birth/Sex: Treating RN: 1946/07/20 (74 y.o. [name] Primary Care Gavina Dildine: [name] Other Clinician: Referring Kailyn Vanderslice: Treating Alic Hilburn/Extender: [name] in Treatment: 1 Wound Status Wound Number: 16 Primary Skin Tear Etiology: Wound Location: Left, Anterior Forearm Wound Healed - Epithelialized Wounding Event: Trauma Status: Date Acquired: 05/26/2020 Comorbid Cataracts, Anemia, Arrhythmia, Congestive Heart Failure,  Weeks Of Treatment: 1 History: Hypertension, Type II Diabetes, Gout, Neuropathy Clustered Wound: No Wound Measurements Length: (cm) Width: (cm) Depth: (cm) Area: (cm) Volume: (cm) 0 % Reduction in Area: 100% 0 % Reduction in Volume: 100% 0 Epithelialization: Large (67-100%) 0 Tunneling: No 0 Undermining: No Wound Description Classification: Full Thickness Without Exposed Support Structu Wound Margin: Distinct, outline attached Exudate Amount: None Present Wound Bed Granulation Amount: None Present (0%) Necrotic Amount: None Present (0%) res Foul Odor After Cleansing: No Slough/Fibrino No Exposed Structure Fascia Exposed: No Fat Layer (Subcutaneous Tissue) Exposed: No Tendon Exposed: No Muscle Exposed: No Joint Exposed: No Bone Exposed: No Electronic Signature(s) Signed: 07/11/2020 5:00:52 PM By: Kela Millin Entered By: Kela Millin on 07/11/2020 09:02:37 -------------------------------------------------------------------------------- Wound Assessment Details Patient Name: Date of Service: KAMAREE, WHEATLEY HA [name] 07/11/2020 8:45 [name] Medical Record Number: [id number] Patient Account Number: [id number] Date of  Birth/Sex: Treating RN: 05-02-1946 (74 y.o. [name] Primary Care Dionel Archey: [name] Other Clinician: Referring Rabab Currington: Treating Jonatha Gagen/Extender: [name] in Treatment: 1 Wound Status Wound Number: [id number] Primary Skin Tear Etiology: Wound Location: Right Forearm Wound Healed - Epithelialized Wounding Event: Trauma Status: Date Acquired: 05/26/2020 Comorbid Cataracts, Anemia, Arrhythmia, Congestive Heart Failure, Weeks Of Treatment: 1 History: Hypertension, Type II Diabetes, Gout, Neuropathy Clustered Wound: No Wound Measurements Length: (cm) Width: (cm) Depth: (cm) Area: (cm) Volume: (cm) 0 % Reduction in Area: 100% 0 % Reduction in Volume: 100% 0 Epithelialization: Large (67-100%) 0 Tunneling: No 0 Undermining: No Wound Description Classification: Full Thickness Without Exposed Support Structures Wound Margin: Distinct, outline attached Exudate Amount: None Present Foul Odor After Cleansing: No Slough/Fibrino No Wound Bed Granulation Amount: None Present (0%) Exposed Structure Necrotic Amount: None Present (0%) Fascia Exposed: No Fat Layer (Subcutaneous Tissue) Exposed: No Tendon Exposed: No Muscle Exposed: No Joint Exposed: No Bone Exposed: No Electronic Signature(s) Signed: 07/11/2020 5:00:52 PM By: Kela Millin Entered By: Kela Millin on 07/11/2020 09:03:06 -------------------------------------------------------------------------------- Vitals Details Patient Name: Date of Service: [name] HA [name] 07/11/2020 8:45 [name] Medical Record Number: [id number] Patient Account Number: [id number] Date of Birth/Sex: Treating RN: 09/13/1946 (74 y.o. [name] Primary Care Rodrickus Min: [name] Other Clinician: Referring Lyndsay Talamante: Treating Tabria Steines/Extender: [name] in Treatment: 1 Vital Signs Time Taken: 08:52 Temperature (F):  98.2 Height (in): 68 Pulse (bpm): 53 Weight (lbs): 192 Respiratory Rate (breaths/min): 19 Body Mass Index (BMI): 29.2 Blood Pressure (mmHg): 164/84 Reference Range: 80 - 120 mg / dl Electronic Signature(s) Signed: 07/11/2020 5:00:52 PM By: Kela Millin Entered By: Kela Millin on 07/11/2020 08:53:44

## 2020-07-14 ENCOUNTER — Other Ambulatory Visit: Payer: Self-pay

## 2020-07-14 ENCOUNTER — Ambulatory Visit (HOSPITAL_COMMUNITY)
Admission: RE | Admit: 2020-07-14 | Discharge: 2020-07-14 | Disposition: A | Discharge status: Home / Self Care | Payer: Medicare Other | Source: Ambulatory Visit | Attending: Nephrology | Admitting: Nephrology

## 2020-07-14 DIAGNOSIS — D631 Anemia in chronic kidney disease: Secondary | ICD-10-CM | POA: Diagnosis not present

## 2020-07-14 DIAGNOSIS — N185 Chronic kidney disease, stage 5: Secondary | ICD-10-CM | POA: Diagnosis present

## 2020-07-14 LAB — HEMOGLOBIN AND HEMATOCRIT, BLOOD
HCT: 28.5 % — ABNORMAL LOW (ref 39.0–52.0)
Hemoglobin: 8.4 g/dL — ABNORMAL LOW (ref 13.0–17.0)

## 2020-07-14 MED ORDER — DARBEPOETIN ALFA 200 MCG/0.4ML IJ SOSY
200.0000 ug | PREFILLED_SYRINGE | INTRAMUSCULAR | Status: DC
  Administered 2020-07-14: 200 ug via SUBCUTANEOUS
  Filled 2020-07-14: qty 0.4

## 2020-07-14 NOTE — Progress Notes (Addendum)
PATIENT CARE CENTER NOTE  Diagnosis:Anemia associated with Chronic Renal Failure   Provider:Coladonato, Broadus John, MD   Procedure:Aranesp injection   Note:Patient received sub-q Aranesp injectionin left arm. Tolerated injection well. No adverse reaction noted. Pre-injection Hemoglobin8.4and BP 170/95. BP elevated but still within the parameters to receive injection. Per patient, he did not take his BP medications this morning but will take it when he arrives home. Discharge paperwork given to patient. Alert, oriented and ambulatory at discharge.

## 2020-07-14 NOTE — Discharge Instructions (Signed)
Darbepoetin Alfa injection What is this medicine? DARBEPOETIN ALFA (dar be POE e tin AL fa) helps your body make more red blood cells. It is used to treat anemia caused by chronic kidney failure and chemotherapy. This medicine may be used for other purposes; ask your health care provider or pharmacist if you have questions. COMMON BRAND NAME(S): Aranesp What should I tell my health care provider before I take this medicine? They need to know if you have any of these conditions:  blood clotting disorders or history of blood clots  cancer patient not on chemotherapy  cystic fibrosis  heart disease, such as angina, heart failure, or a history of a heart attack  hemoglobin level of 12 g/dL or greater  high blood pressure  low levels of folate, iron, or vitamin B12  seizures  an unusual or allergic reaction to darbepoetin, erythropoietin, albumin, hamster proteins, latex, other medicines, foods, dyes, or preservatives  pregnant or trying to get pregnant  breast-feeding How should I use this medicine? This medicine is for injection into a vein or under the skin. It is usually given by a health care professional in a hospital or clinic setting. If you get this medicine at home, you will be taught how to prepare and give this medicine. Use exactly as directed. Take your medicine at regular intervals. Do not take your medicine more often than directed. It is important that you put your used needles and syringes in a special sharps container. Do not put them in a trash can. If you do not have a sharps container, call your pharmacist or healthcare provider to get one. A special MedGuide will be given to you by the pharmacist with each prescription and refill. Be sure to read this information carefully each time. Talk to your pediatrician regarding the use of this medicine in children. While this medicine may be used in children as young as 1 month of age for selected conditions, precautions do  apply. Overdosage: If you think you have taken too much of this medicine contact a poison control center or emergency room at once. NOTE: This medicine is only for you. Do not share this medicine with others. What if I miss a dose? If you miss a dose, take it as soon as you can. If it is almost time for your next dose, take only that dose. Do not take double or extra doses. What may interact with this medicine? Do not take this medicine with any of the following medications:  epoetin alfa This list may not describe all possible interactions. Give your health care provider a list of all the medicines, herbs, non-prescription drugs, or dietary supplements you use. Also tell them if you smoke, drink alcohol, or use illegal drugs. Some items may interact with your medicine. What should I watch for while using this medicine? Your condition will be monitored carefully while you are receiving this medicine. You may need blood work done while you are taking this medicine. This medicine may cause a decrease in vitamin B6. You should make sure that you get enough vitamin B6 while you are taking this medicine. Discuss the foods you eat and the vitamins you take with your health care professional. What side effects may I notice from receiving this medicine? Side effects that you should report to your doctor or health care professional as soon as possible:  allergic reactions like skin rash, itching or hives, swelling of the face, lips, or tongue  breathing problems  changes in   vision  chest pain  confusion, trouble speaking or understanding  feeling faint or lightheaded, falls  high blood pressure  muscle aches or pains  pain, swelling, warmth in the leg  rapid weight gain  severe headaches  sudden numbness or weakness of the face, arm or leg  trouble walking, dizziness, loss of balance or coordination  seizures (convulsions)  swelling of the ankles, feet, hands  unusually weak or  tired Side effects that usually do not require medical attention (report to your doctor or health care professional if they continue or are bothersome):  diarrhea  fever, chills (flu-like symptoms)  headaches  nausea, vomiting  redness, stinging, or swelling at site where injected This list may not describe all possible side effects. Call your doctor for medical advice about side effects. You may report side effects to FDA at 1-800-FDA-1088. Where should I keep my medicine? Keep out of the reach of children. Store in a refrigerator between 2 and 8 degrees C (36 and 46 degrees F). Do not freeze. Do not shake. Throw away any unused portion if using a single-dose vial. Throw away any unused medicine after the expiration date. NOTE: This sheet is a summary. It may not cover all possible information. If you have questions about this medicine, talk to your doctor, pharmacist, or health care provider.  2020 Elsevier/Gold Standard (2017-09-21 16:44:20)  

## 2020-07-18 ENCOUNTER — Encounter (HOSPITAL_BASED_OUTPATIENT_CLINIC_OR_DEPARTMENT_OTHER): Payer: Medicare Other | Admitting: Internal Medicine

## 2020-07-18 ENCOUNTER — Other Ambulatory Visit: Payer: Self-pay

## 2020-07-18 DIAGNOSIS — E1122 Type 2 diabetes mellitus with diabetic chronic kidney disease: Secondary | ICD-10-CM | POA: Diagnosis not present

## 2020-07-21 NOTE — Progress Notes (Signed)
ADONAI, SELSOR ([id number]) Visit Report for 07/18/2020 Debridement Details Patient Name: Date of Service: [name], [name] 07/18/2020 9:00 A M Medical Record Number: [id number] Patient Account Number: [id number] Date of Birth/Sex: Treating RN: 22-Aug-1946 (74 y.o. [name], [name] Primary Care Provider: Donetta [name] Other Clinician: Referring Provider: Treating Provider/Extender: [name] in Treatment: 2 Debridement Performed for Assessment: Wound #12 Left,Lateral Lower Leg Performed By: Physician [name]., MD Debridement Type: Debridement Severity of Tissue Pre Debridement: Fat layer exposed Level of Consciousness (Pre-procedure): Awake and Alert Pre-procedure Verification/Time Out Yes - 09:50 Taken: Start Time: 09:50 Pain Control: Other : benzocaine 20% spray T Area Debrided (L x W): otal 2 (cm) x 1.1 (cm) = 2.2 (cm) Tissue and other material debrided: Viable, Non-Viable, Slough, Subcutaneous, Slough Level: Skin/Subcutaneous Tissue Debridement Description: Excisional Instrument: Curette Bleeding: Minimum Hemostasis Achieved: Pressure End Time: 09:53 Procedural Pain: 4 Post Procedural Pain: 2 Response to Treatment: Procedure was tolerated well Level of Consciousness (Post- Awake and Alert procedure): Post Debridement Measurements of Total Wound Length: (cm) 2 Width: (cm) 1.1 Depth: (cm) 0.1 Volume: (cm) 0.173 Character of Wound/Ulcer Post Debridement: Requires Further Debridement Severity of Tissue Post Debridement: Fat layer exposed Post Procedure Diagnosis Same as Pre-procedure Electronic Signature(s) Signed: 07/18/2020 4:23:42 PM By: Michael Gouty RN, BSN Signed: 07/21/2020 2:10:44 PM By: Michael Ham MD Entered By: [name] on 07/18/2020 09:59:23 -------------------------------------------------------------------------------- Debridement Details Patient Name: Date of Service: [name], [name]  HA M 07/18/2020 9:00 A M Medical Record Number: [id number] Patient Account Number: [id number] Date of Birth/Sex: Treating RN: 1946-09-05 (74 y.o. [name] Primary Care Provider: Donetta [name] Other Clinician: Referring Provider: Treating Provider/Extender: [name] in Treatment: 2 Debridement Performed for Assessment: Wound #13 Left T Fifth oe Performed By: Physician [name]., MD Debridement Type: Debridement Severity of Tissue Pre Debridement: Fat layer exposed Level of Consciousness (Pre-procedure): Awake and Alert Pre-procedure Verification/Time Out Yes - 09:50 Taken: Start Time: 09:50 Pain Control: Other : benzocaine 20% spray T Area Debrided (L x W): otal 1 (cm) x 0.5 (cm) = 0.5 (cm) Tissue and other material debrided: Viable, Non-Viable, Slough, Subcutaneous, Slough Level: Skin/Subcutaneous Tissue Debridement Description: Excisional Instrument: Curette Bleeding: Minimum Hemostasis Achieved: Pressure End Time: 09:53 Procedural Pain: 7 Post Procedural Pain: 4 Response to Treatment: Procedure was tolerated well Level of Consciousness (Post- Awake and Alert procedure): Post Debridement Measurements of Total Wound Length: (cm) 1 Width: (cm) 0.5 Depth: (cm) 0.2 Volume: (cm) 0.079 Character of Wound/Ulcer Post Debridement: Requires Further Debridement Severity of Tissue Post Debridement: Fat layer exposed Post Procedure Diagnosis Same as Pre-procedure Electronic Signature(s) Signed: 07/18/2020 4:23:42 PM By: Michael Gouty RN, BSN Signed: 07/21/2020 2:10:44 PM By: Michael Ham MD Entered By: [name] on 07/18/2020 09:59:40 -------------------------------------------------------------------------------- HPI Details Patient Name: Date of Service: [name], [name] HA M 07/18/2020 9:00 A M Medical Record Number: [id number] Patient Account Number: [id number] Date of Birth/Sex: Treating RN: 01/29/1946  (74 y.o. [name] Primary Care Provider: Donetta [name] Other Clinician: Referring Provider: Treating Provider/Extender: [name] in Treatment: 2 History of Present Illness HPI Description: [name] HPI Description: 74 year old gentleman who was recently seen by his nephrologist Dr. Donato [name], and noted to have a wound on his left lower extremity which was lacerated 2 months ago and now has reopened. The patient's left shin has a ulceration with some exudate but no evidence of infection and he was  referred to [place] for further care as it was known that the patient has had some peripheral vascular disease in the past. Past medical history significant for chronic kidney disease, atrial fibrillation, diabetes mellitus,status post kidney transplant in 1983 and 2005, a week fistula graft placement, status post previous bowel surgery. 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 10/06/2015 -- x-ray of the left tibia and fibula shows no evidence of osteomyelitis. The patient has also had Doppler studies of his extremity and is awaiting the appointment with the vascular surgeon. We have not yet received these reports. 10/13/2015 -- lower extremity venous duplex reflux evaluation shows reflux in the left common femoral vein, left saphenofemoral junction and the proximal greater saphenous vein extending to the proximal calf. There is also reflux in the left proximal to mid small saphenous vein. Arterial duplex studies done showed the resting ABI was not applicable due to tibial artery medial calcification. 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 The right TBI was 1 which is normal and the left ABI was 1 which is normal. Patient has otherwise been doing fine and has been compliant with his  dressings. 10/20/2015 -- He was seen by Dr. Adele [name] recently for a vascular opinion on 10/15/2015. His left lower extremity venous insufficiency duplex study revealed GSV reflux,SS vein reflux and deep venous reflux in the common femoral vein. His ABIs were non compressible and his TBI on the right was 1.01 and on the left was 0.80. He was asked to continue with the wound care with compressive therapy followed by EVLA of the left GS vein 3 months. He recommended 20-30 mm thigh- high compression stockings and the need for a three-month trial of this. The patient had an Unna boot applied at the vascular office but he could not tolerate this with a lot of pain and issues with his toes and hence came here on Friday for removal of this and we reapplied a 2 layer compression. 11/10/2015 -- patient still has not purchased his 20-30 mm thigh-high compression stockings as prescribed by [name]. Readmission: 08/08/18 on evaluation today patient presents for readmission concerning a new injury to the left anterior lower extremity. He was previously seen in 2017 here in our clinic. He states that he has done fairly well since that point. Nonetheless he is having at this time some pain but states that he hit this on a table that fell over and actually struck his leg. This appears to have pulled back some of his skin which folded in on itself and is causing some difficulty as far as that is concerned. There does not appear to be any evidence of infection at this time. No fevers, chills, nausea, or vomiting noted at this time. He's been using dressings on his own currently without complication. 08/15/18 on evaluation today patient actually appears to be doing somewhat better in regard to his wound of the lower [name] when compared to the first visit last week. I had to do a much more extensive debridement at that time it does appear that I'm gonna have to perform some debridement today but it does not look to  be as extensive by any means. Nonetheless fortunately he does not show any signs of infection he does have discomfort at this site. I believe based on what I'm seeing currently he  may benefit from Iodoflex to help keep the wound bed clean. Patient tolerated therapy without complication. Upon evaluation today the patient actually appears to be doing excellent in regard to his left lower extremity ulcer. This is much better than the previous two visits where he had a lot of necrotic tissue around the edge of the wound simply due to the fact that again there was a significant skin tear where the edge had been cleared away prior to reattaching and being able to heal appropriately. He seems to be doing much better at this point. 08/28/18 on evaluation today the patient's wound actually does appear to be showing signs of improvement. With that being said though he is improving he would likely note even greater improvement if we were able to sharply debride the wound. Nonetheless this caused him to much discomfort he tells me. 09/04/18 on evaluation today patient actually appears to be showing signs of improvement in regard to his left lower extremity ulcer. He has been tolerating the dressing changes including the wrap although he tells me at this point that the burning does last for a couple of days even with just the Iodoflex. I was afraid that this may been part of the issue that he was having with discomfort. It does seem to be the case. Nonetheless he shows no signs of evidence of infection at this time which is good news. No fevers chills noted ADMISSION to [name] 10/05/2018 This is a patient who was cared for in 2017 and again in the fall of this year at our sister clinic and [facility]. He actually lives in [place] in [place]. We have been dealing with an apparent traumatic area on the left anterior tibial area. This is been present for the last several months. He was supposed  to be using Iodoflex Kerlix and Coban however he was hospitalized from 09/05/2018 through 09/11/2018 with delirium secondary to pneumonia. Since then he is only been putting Vaseline gauze on this without compression. He also has a more recent skin tear on the dorsal right hand that may have only happened in the last week. The patient had arterial studies done in 2017 in January which was 3 years ago. At that point he had noncompressible ABIs but really quite good TBI's both normal. Triphasic waveforms on the right monophasic at the left posterior tibial but triphasic at the left dorsalis pedis. His ABIs in our clinic today were both noncompressible 1/23; the patient has wounds on the right dorsal hand just distal to the wrist and on the left anterior lower extremity. Both of these look very healthy he is using Hydrofera Blue 1/30; left anterior lower extremity wound much smaller. Healthy looking surface. The laceration area just distal to the wrist on the dorsal hand on the right is also just about closed I used Hydrofera Blue here 2/6; left anterior lower extremity wound is much smaller but still open. The laceration area just distal to the wrist on the dorsal hand is fully epithelialized. 2/13; the patient's anterior lower extremity wound is closed. The laceration just distal to the wrist on the dorsal hand is also fully epithelialized and closed. The patient has external compression stockings which I think are 20/30 READMISSION 08/06/2019 [name] is a 73 year old man with had several times previously in our clinic. He is a diabetic with a history of chronic renal insufficiency status post kidney transplant in 1983 and again in 2005. He was then in 2017 with a laceration  on the left lower extremity. He was worked up at the time with arterial studies and reflux studies. The arterial studies showed ABIs to be noncompressible but TBI's were within normal limits. I do not have the reflux studies  at the moment. He was also sent here in 2019 with a left lower extremity wound and then again in 2020 with left lower extremity trauma a skin tear on the wrist. He was discharged with 20/30 stockings identified from myself that that might not be enough compression. Nevertheless he states he was wearing these fairly reliably. In September he had a fall with a substantial bruise in the area of the wound. He says he saw orthopedics and they told him there was some muscle strain sometime it later this opened into a wound. He has a fairly substantial wound on the right posterior calf. Satellite areas around this including medially and posteriorly. He has not worn his stockings since the injury Past medical history; includes chronic renal failure secondary to diabetes with kidney transplant x2, atrial fibrillation, heart failure with preserved ejection fraction, coronary artery disease. ABIs on the right in our clinic were once again noncompressible 08/13/2019 on evaluation today patient appears to be doing decently well with regard to his wound compared to last week's evaluation. Unfortunately he is still having a lot of discomfort at this point which is I think in some part due to the 3 layer compression wrap which is a little bit stronger I think for him. When he was here before we actually utilized a Kerlix and Coban wrap which he states seemed to got a little bit better. Nonetheless I think we can probably drop back to this in light of the discomfort that he had. Nonetheless the pain was not really right around the wound itself as much as it was around the ankle in particular. The Iodoflex does seem to have done well for him As the wound is appearing somewhat better today which is excellent news. 11/30; still complaining of a lot of pain. Apparently arterial studies I ordered 2 weeks ago are below. I do not believe we have an appointment with vein and vascular as of yet; ABI  Findings: +---------+------------------+-----+----------+--------+  Right  Rt Pressure (mmHg) Index Waveform  Comment   +---------+------------------+-----+----------+--------+  PTA  >254  1.50  monophasic    +---------+------------------+-----+----------+--------+  DP  >254  1.50  monophasic    +---------+------------------+-----+----------+--------+  Great T oe 68  0.40  Abnormal     +---------+------------------+-----+----------+--------+ +---------+------------------+-----+----------+-------+  Left  Lt Pressure (mmHg) Index Waveform  Comment  +---------+------------------+-----+----------+-------+  Brachial  169         +---------+------------------+-----+----------+-------+  PTA  >254  1.50  monophasic    +---------+------------------+-----+----------+-------+  DP  >254  1.50  monophasic    +---------+------------------+-----+----------+-------+  Great T oe 40  0.24  Abnormal     +---------+------------------+-----+----------+-------+ Pedal arteries appear hyperemic. Patient refused Brachial pressure in the right arm. Summary: Right: Resting right ankle-brachial index indicates noncompressible right lower extremity arteries. The right toe-brachial index is abnormal. Left: Resting left ankle-brachial index indicates noncompressible left lower extremity arteries. The left toe-brachial index is abnormal. He is also having considerably more swelling in his left calf. This was not there when I last saw him 2 weeks ago. He tells me that some of the home health compression wraps have been slipping down and that may be the issue here however a month I am uncertain 17/6; sees vascular on December 22. Still complaining of a  lot of pain. DVT study I did last time was negative for DVT 12/14; still complaining of pain which if this is arterial is certainly claudication and rest keeps him uncomfortable at night. He has an appointment with vein and vascular on December 22. Wound surface is better  using Iodoflex. Once the surface of this is satisfactory and we have exhausted the vascular route. Perhaps an advanced treatment option. He has a configuration of the venous ulceration although his arterial studies are not very good. The other issue is the patient has a transplanted kidney. This will make angiography difficult and challenging issue 12/21; still complaining of pain and drainage. We are using Iodoflex on the wound under compression. He sees Dr. Donnetta Hutching tomorrow to evaluate his noninvasive studies noted above. He has a transplanted kidney further complicating the options for angiography. 12/28; still using Iodoflex under compression. I have Dr. Luther Parody note from 12/22. he noted arterial studies revealing monophasic waveforms at the pedal vessels bilaterally and calcified vessels making the ABI unreliable. He did not comment on the reduced TBI's. He felt these were venous wounds based on the palpable dorsalis pedis pulse. 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 And they arranged for formal venous duplex with reflux studies in the next several weeks. Follow-up with either Scot Dock or Dr. Oneida Alar 09/24/2019 still using Iodoflex under compression. He has an appointment with Dr. Doren Custard on 1/7 with regards to his venous disease. The patient was not felt to have a primary arterial problem for the nonhealing of his wound. We did attempt to wrap him with 3 layer when he first came into the clinic he complained of a lot of pain in the ankle although this may have been because the dressing fell down somewhat. He has far too edema fluid in the right leg for a good prognosis about healing this wound He comes in today with an excoriation on the bottom part of his right fifth toe. He thinks he may have done this taking off his clothes and that something got caught on the toe. There is no open wound however the toe itself is very painful 1/11; we are using Iodoflex under compression. The wound bed is  clean. He went on to see Dr. Doren Custard on 09/27/2019 he again feels that the patient's arterial supply is adequate. He feels that he might benefit from right greater saphenous vein ablation for a venous ulcer. In the meantime the area that he was complaining about last week on the right fifth toe. An x-ray that I ordered showed marked soft tissue swelling along the distal aspect of the right foot but there was no evidence of osteomyelitis. He comes in today with the fifth toenail just about coming off. He has black eschar underneath the toe on the plantar aspect. The toe was swollen red and very painful. In the right setting this could be a significant soft tissue infection versus ischemia of the toe itself. He did not show this to Dr. Doren Custard 1/18; I am using Iodoflex under compression a large wound on the right posterior calf. He had his right greater saphenous vein ablation by Dr. Doren Custard although I am not sure that is the only problem here. The right fifth toe which was possibly trauma 2 weeks ago continues to be exceptionally painful with a necrotic tip. Maybe not quite as swollen. I started him empirically on Augmentin 5/125 1 p.o. twice daily last week after discussing this with Dr. Loletha Grayer of nephrology. Perhaps somewhat better this  week but not as good as I was hoping. A plain x-ray was negative. He comes in today with an area on the medial right calf that was blistered and now open. In looking at things he appears to be systemically fluid volume overloaded. He has a transplanted kidney. He states he takes his Lasix variably when he has appointments he tries not to take it the later I am really not certain if he takes this reliably. However he has far too much edema fluid in the right leg to easily heal this wound and he appears to be developing blisters medially to form additional wounds 1/25; his CO2 angiogram was done by Dr. Doren Custard last week. This showed the proximal arteries all to be patent. On the right  side the common femoral deep femoral superficial femoral popliteal anterior tibial and peroneal arteries were patent. The posterior tibial was occluded but reconstituted distally via collaterals from the peroneal artery he was felt he should have enough blood flow to heal his wounds including the right fifth toe. The right fifth toe looks better however he is still complaining of a lot of pain. The large area which is a venous ulceration posteriorly has a better looking surface I think we can switch to Hydrofera Blue today 2/1; the patient's original wounds on the right posterior medial calf has come down in width however superior to this he has new denuded areas and I am concerned we simply do not have enough edema control. He has already undergone a right greater saphenous ablation. He had a CO2 angiogram done by Dr. Doren Custard and the comment was that we should have enough blood flow to heal the venous wound however we simply do not seem to have enough edema control with 3 layer compression. The patient is status post kidney transplant although his exact renal function is not really clear. Nor am i sure what dose of diuretic he is supposed to be on. He also has the area on the right fifth toe which was unclear etiology but I think became secondarily infected I gave him 2 weeks of antibiotics for this and this seems to have settled down he still has a black eschar over the tip of the toe. X-ray was negative for fracture. He says he has a history of gout. 2/8; the patient's wound on the right posterior calf is about the same. The superficial area medially also about the same. He got a prescription for prednisoneo Gout after he developed erythema on the dorsal aspect of his left great toe going along with the right fifth toe which has been problematic all along. He has not taken it because he is concerned about increasing CBGs. He has a transplanted kidney is already on prednisone 5 mg. He would not be a  good candidate for NSAIDs. Perhaps colchicine. He is not aware of what his uric acid level is 2/15; his right posterior calf wound seems to be coming in in terms of width. Everything here looks fairly good. No mechanical debridement we have been using Hydrofera Blue. He has had an ablation by vein and vascular. Felt to have adequate arterial supply to this area. The patient got prednisone last week from Dr. Angelique Holm of nephrology. At my urging he actually took it. The area on his left toe was a lot better. The right toe was not as painful but still erythematous with a wound at the tip. We have been using silver alginate here 2/22; right posterior calf seems to be  gradually epithelializing. Still a fairly substantial wound. He still has an area on the tip of his right fifth toe which I think was gout related. This is gradually improving. We have been using Unna boots to wrap X-ray I did of the foot last time was again negative there was soft tissue irregularity about the distal fifth digit but no radiographic evidence of bone damage 3/1; right posterior calf seems to be gradually improving however there were areas of hyper granulation. We have been using Hydrofera Blue. The hyper granulation is mostly evident in the most superior finger shape projection of the wound itself. The area on the right fifth toe also was slough covered and required debridement. 3/8 continued improvement in the right posterior calf and the tip of the right fifth toe. We have been using Hydrofera Blue under compression he is changing the area to the toe 3/22; continued improvement in the right posterior calf. Unfortunately comes in today with a new skin tear in the mid anterior tibia area this probably had something to do with changing his dressing home health I called and left this a message last week. 3/29; continued improvement in the a substantial wound on the right posterior calf. The skin tear anteriorly from last week  is already healed on the tip of the right fifth toe there is still a nonviable area. 4/5; we have continued contraction of the substantial wound on the right posterior calf. We continue to have problems with the tip of the right fifth toe. We have been using Hydrofera Blue to both wound areas He is complaining about some discomfort in the Achilles area. T me he had surgery for what sounds like a torn Achilles about 10 years ago. ells 4/12; we have continued contraction of the substantial wound on the right posterior calf. Still an area on the tip of the right fifth toe. I changed him to silver collagen on the toe last week but I think home health continue to use Hydrofera Blue to both wound areas. 4/19; we continue to have contraction of the substantial wound on the right posterior calf however there continues to be hyper granulation. The area on the tip of the fifth toe is very small still some debris on the surface we have been using silver collagen 4/26; both wounds have contracted. I was hoping the fifth toe wound would close over but with probing there is still an open small hole here. 5/3; his wound on the right posterior calf which was his major area continues to contract looks healthy we have been using Hydrofera Blue. Using silver collagen to the fifth toe, not making a lot of progress here 5/10; right posterior calf wound continues to be smaller in terms of surface area and look healthy we have been using Hydrofera Blue here. The tip of the right fifth toe is still open requiring debridement. He has a new laceration on the right forearm he says he hit this on a microwave 6 days ago 02/11/20-The right fifth toe wound is closed, the right posterior calf wound looks healthy with smaller dimensions compared with last time, the right forearm area of laceration has a small blister which I try to open up with scalpel with very little fluid 6/7 the right fifth toe looks fine. Area on the right  forearm is also closed. Right posterior calf wound continues to contract. Much smaller area remaining with healthy surface. We are using Hydrofera Blue under compression 6/21; area on the right posterior calf.  There is only 1 small area in the most distal part of this. We have been using Hydrofera Blue under compression this is contracting surface looks healthy 6/28; right posterior calf this is healed. This was initially a hematoma. During the stay in this clinic he had a right greater saphenous vein ablation by Dr. Doren Custard of vein and vascular. He also had a difficult time with his right fifth toeo Infection versus gout. Very difficult time with this so that this has been closed for about 6 weeks. ------------------- READMISSION 04/29/20-Patient returns to the clinic this time with a right index finger swelling and pain been drainage, patient has been to the hand surgeon and vascular surgeon and ended up having a small IandD attempted in the office with expression of some gritty whitish material, patient also hit his hand and developed a small hematoma around this area. He also had vascular Dopplers done that show monophasic right radial right ulnar waveforms. He has follow-up appointments with vascular surgery as well as the hand surgeon. His appointment today with a hand surgeon he was told that he may lose the tip of his index finger if surgery is done to open up the entire area. He is reluctant to have any surgery and would like to try conservative measures He has tried epsom salt soaks with improvement then worsening , he has just finished a course of antibiotics, he does not remember the name 8/16; patient was readmitted to the clinic last week and I am seeing this for the first time. Previously we had looked at this man for a large wound in the right posterior calf that was initially a hematoma. He went on to have a greater saphenous vein ablation. During the stay here he also had a difficult  time with an area over his right fifth toe question infectious question gout at that time. This eventually closed over although I do not think the differential was ever really totally clear in my mind. The patient is here this time with a necrotic area on the palmar surface of his right index finger. He states this started in July with a small red area. He spent some time going to see Dr. Jarome Matin of dermatology. Ended up with Dr. 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 He is also seen hand surgery which is fortunate is he is likely to require either debridement or amputation of this finger. He had a course of antibiotics Keflex and doxycycline I believe but he is finished these. Currently using Betadine. He describes this is very painful. I have not had a chance to look back over notes in care everywhere. I would like to see as hand surgery notes, vascular surgery notes and dermatology notes if this is possible. He also apparently had an x-ray ordered by Dr. Amedeo Plenty that I would like to review. 6/60; certainly no improvement in his right index finger palmar aspect. Culture I did last time showed abundant Enterobacter and abundant Proteus. I have him on cefdinir 300 mg every 24 as of Friday in response to this. This was a deep tissue culture. 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 He still complaining of a lot of pain. READMISSION 07/04/2020 [name] is a 74 year old man with wounds on his bilateral lower extremities and bilateral upper extremities which were apparently the consequence of being found down at home in early September. He required admission to the hospital from 9/6  through 9/17 with delirium, hyponatremia, sepsis and type 2 diabetes. He gradually improved. He was sent to a nursing home in [place] and recently has been discharged home. They are using some foam-based  dressings on his bilateral lower and upper extremity wounds as well as his left fifth toe which apparently are felt to be secondary from the prolonged friction and pressure of being recumbent. We have had [name] in the clinic on 2 different occasions. Predominantly the first time for a large wound on the right posterior calf related to trauma and chronic venous insufficiency. He was here in August with a necrotic wound on the tip of his right index finger which ultimately required amputation this is still currently wrapped. 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 Vanita Panda of nephrology He has not been previously felt to have an arterial issue previous ABIs were noncompressible 10/22; readmitted to the clinic last week. His wounds on the upper extremities are healed as well as the right lower extremity. He still has an area on the left lateral calf and the lateral part of his fifth toe. We have been using silver alginate. He looks like he has had a lot of edema improvement with the recent addition to his diuretics by Dr. Jonn Shingles of nephrology 10/29;. He has 2 wounds on the left lateral calf and the dorsal aspect the left 5th toe nonviable surface. We are using silver alginate Electronic Signature(s) Signed: 07/21/2020 2:10:44 PM By: Michael Ham MD Entered By: [name] on 07/18/2020 10:00:18 -------------------------------------------------------------------------------- Physical Exam Details Patient Name: Date of Service: [name], [name] HA M 07/18/2020 9:00 A M Medical Record Number: [id number] Patient Account Number: [id number] Date of Birth/Sex: Treating RN: July 01, 1946 (74 y.o. [name] Primary Care  Provider: Donetta [name] Other Clinician: Referring Provider: Treating Provider/Extender: [name] in Treatment: 2 Constitutional Patient is hypertensive.. Pulse regular and within target range for patient.Marland Kitchen Respirations regular, non-labored and within target range.. Temperature is normal and within the target range for the patient.Marland Kitchen Appears in no distress. Notes Wound exam; no improvement from last week still nonviable surfaces on these areas #5 curette debridement which she never tolerates very well. Hemostasis with direct pressure. We will change the dressing to Iodoflex. His edema is well controlled pedal pulses are palp Electronic Signature(s) Signed: 07/21/2020 2:10:44 PM By: Michael Ham MD Entered By: [name] on 07/18/2020 10:01:01 -------------------------------------------------------------------------------- Physician Orders Details Patient Name: Date of Service: [name], [name] HA M 07/18/2020 9:00 A M Medical Record Number: [id number] Patient Account Number: [id number] Date of Birth/Sex: Treating RN: Mar 10, 1946 (74 y.o. [name] Primary Care Provider: Donetta [name] Other Clinician: Referring Provider: Treating Provider/Extender: [name] in Treatment: 2 Verbal / Phone Orders: No Diagnosis Coding ICD-10 Coding Code Description [phone] Non-pressure chronic ulcer of left calf with fat layer exposed 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 of skin L97.211 Non-pressure chronic ulcer of right calf limited to breakdown of skin S40.812D Abrasion of left upper arm, subsequent encounter S40.811D Abrasion of right upper arm, subsequent encounter E11.622 Type 2 diabetes mellitus with other skin ulcer I87.323 Chronic venous hypertension (idiopathic) with inflammation of bilateral lower extremity Follow-up Appointments Return Appointment in  1 week. Dressing Change Frequency Wound #12 Left,Lateral Lower Leg Other: - 2 times per week (once at wound center, once at wound center) all wounds Wound #13 Left T Fifth oe Other: - 2 times per week (once at wound center, once at wound center) all wounds Skin Barriers/Peri-Wound Care Moisturizing lotion - to left leg with dressing changes and right leg daily Wound Cleansing May shower with protection. May shower and wash wound with soap and water. - on days when dressings are changed Primary Wound Dressing Wound #12 Left,Lateral Lower Leg Iodoflex Wound #13 Left T Fifth oe Iodoflex Secondary Dressing Wound #12 Left,Lateral Lower Leg Dry Gauze Wound #13 Left T Fifth oe Kerlix/Rolled Gauze Dry Gauze Edema Control 3 Layer Compression System - Left Lower Extremity Avoid standing for long periods of time Elevate legs to the level of the heart or above for 30 minutes daily and/or when sitting, a frequency of: - whenever sitting throughout the day Exercise regularly Support Garment 20-30 mm/Hg pressure to: - compression stocking to right leg daily, apply in first thing in the morning and remove at night [facility] for wound care. - [facility] Patient Medications llergies: Elavil A Notifications Medication Indication Start End prior to debridement 07/18/2020 benzocaine DOSE topical 20 % aerosol - aerosol topical Electronic Signature(s) Signed: 07/18/2020 4:23:42 PM By: Michael Gouty RN, BSN Signed: 07/21/2020 2:10:44 PM By: Michael Ham MD Entered By: [name] on 07/18/2020 09:57:26 -------------------------------------------------------------------------------- Problem List Details Patient Name: Date of Service: [name], [name] HA M 07/18/2020 9:00 A M Medical Record Number: [id number] Patient Account Number: [id number] Date of Birth/Sex: Treating RN: [birth year] (74 y.o. [name], [name] Primary Care Provider: Donetta [name] Other Clinician: Referring Provider: Treating Provider/Extender: [name] in Treatment: 2 Active Problems ICD-10 Encounter Code Description Active Date MDM Diagnosis [phone] Non-pressure chronic ulcer of left calf with fat layer exposed 07/04/2020 No Yes 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 of 07/04/2020 No Yes skin L97.211 Non-pressure chronic ulcer of right calf limited to breakdown of skin 07/04/2020 No Yes S40.812D Abrasion of left upper arm, subsequent encounter 07/04/2020 No Yes S40.811D Abrasion of right upper arm, subsequent encounter 07/04/2020 No Yes E11.622 Type 2 diabetes mellitus with other skin ulcer 07/04/2020 No Yes I87.323 Chronic venous hypertension (idiopathic) with inflammation of bilateral lower 07/04/2020 No Yes extremity Inactive Problems Resolved Problems Electronic Signature(s) Signed: 07/21/2020 2:10:44 PM By: Michael Ham MD Entered By: [name] on 07/18/2020 09:59:05 -------------------------------------------------------------------------------- Progress Note Details Patient Name: Date of Service: [name] HA M 07/18/2020 9:00 A M Medical Record Number: [id number] Patient Account Number: [id number] Date of Birth/Sex: Treating RN: August 31, 1946 (74 y.o. [name] Primary Care Provider: Donetta [name] Other Clinician: Referring Provider: Treating Provider/Extender: [name] in Treatment: 2 Subjective History of Present Illness (HPI) [place] HPI Description: 74 year old gentleman who was recently seen by his nephrologist Dr. Donato [name], and noted to have a wound on his left lower extremity which was lacerated 2 months ago and now has reopened. The patient's left shin has a ulceration with some exudate but no evidence of infection and he was referred to [place] for further care as it was known that  the patient has had some peripheral vascular disease in the past. Past medical history significant for chronic kidney disease, atrial fibrillation, diabetes mellitus,status post kidney transplant in 1983 and 2005, a week fistula graft  placement, status post previous bowel surgery. 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 10/06/2015 -- x-ray of the left tibia and fibula shows no evidence of osteomyelitis. The patient has also had Doppler studies of his extremity and is awaiting the appointment with the vascular surgeon. We have not yet received these reports. 10/13/2015 -- lower extremity venous duplex reflux evaluation shows reflux in the left common femoral vein, left saphenofemoral junction and the proximal greater saphenous vein extending to the proximal calf. There is also reflux in the left proximal to mid small saphenous vein. Arterial duplex studies done showed the resting ABI was not applicable due to tibial artery medial calcification. 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 The right TBI was 1 which is normal and the left ABI was 1 which is normal. Patient has otherwise been doing fine and has been compliant with his dressings. 10/20/2015 -- He was seen by Dr. Adele [name] recently for a vascular opinion on 10/15/2015. His left lower extremity venous insufficiency duplex study revealed GSV reflux,SS vein reflux and deep venous reflux in the common femoral vein. His ABIs were non compressible and his TBI on the right was 1.01 and on the left was 0.80. He was asked to continue with the wound care with compressive therapy followed by EVLA of the left GS vein 3 months. He recommended 20-30 mm thigh- high compression stockings and the need for a three-month trial of this. The patient had an Unna boot applied at the vascular office but  he could not tolerate this with a lot of pain and issues with his toes and hence came here on Friday for removal of this and we reapplied a 2 layer compression. 11/10/2015 -- patient still has not purchased his 20-30 mm thigh-high compression stockings as prescribed by [name]. Readmission: 08/08/18 on evaluation today patient presents for readmission concerning a new injury to the left anterior lower extremity. He was previously seen in 2017 here in our clinic. He states that he has done fairly well since that point. Nonetheless he is having at this time some pain but states that he hit this on a table that fell over and actually struck his leg. This appears to have pulled back some of his skin which folded in on itself and is causing some difficulty as far as that is concerned. There does not appear to be any evidence of infection at this time. No fevers, chills, nausea, or vomiting noted at this time. He's been using dressings on his own currently without complication. 08/15/18 on evaluation today patient actually appears to be doing somewhat better in regard to his wound of the lower [name] when compared to the first visit last week. I had to do a much more extensive debridement at that time it does appear that I'm gonna have to perform some debridement today but it does not look to be as extensive by any means. Nonetheless fortunately he does not show any signs of infection he does have discomfort at this site. I believe based on what I'm seeing currently he may benefit from Iodoflex to help keep the wound bed clean. Patient tolerated therapy without complication. Upon evaluation today the patient actually appears to be doing excellent in regard to his left lower extremity ulcer. This is much better than the previous two visits where he  had a lot of necrotic tissue around the edge of the wound simply due to the fact that again there was a significant skin tear where the edge had been cleared  away prior to reattaching and being able to heal appropriately. He seems to be doing much better at this point. 08/28/18 on evaluation today the patient's wound actually does appear to be showing signs of improvement. With that being said though he is improving he would likely note even greater improvement if we were able to sharply debride the wound. Nonetheless this caused him to much discomfort he tells me. 09/04/18 on evaluation today patient actually appears to be showing signs of improvement in regard to his left lower extremity ulcer. He has been tolerating the dressing changes including the wrap although he tells me at this point that the burning does last for a couple of days even with just the Iodoflex. I was afraid that this may been part of the issue that he was having with discomfort. It does seem to be the case. Nonetheless he shows no signs of evidence of infection at this time which is good news. No fevers chills noted ADMISSION to [name] 10/05/2018 This is a patient who was cared for in 2017 and again in the fall of this year at our sister clinic and [facility]. He actually lives in [place] in [place]. We have been dealing with an apparent traumatic area on the left anterior tibial area. This is been present for the last several months. He was supposed to be using Iodoflex Kerlix and Coban however he was hospitalized from 09/05/2018 through 09/11/2018 with delirium secondary to pneumonia. Since then he is only been putting Vaseline gauze on this without compression. He also has a more recent skin tear on the dorsal right hand that may have only happened in the last week. The patient had arterial studies done in 2017 in January which was 3 years ago. At that point he had noncompressible ABIs but really quite good TBI's both normal. Triphasic waveforms on the right monophasic at the left posterior tibial but triphasic at the left dorsalis pedis. His ABIs in our  clinic today were both noncompressible 1/23; the patient has wounds on the right dorsal hand just distal to the wrist and on the left anterior lower extremity. Both of these look very healthy he is using Hydrofera Blue 1/30; left anterior lower extremity wound much smaller. Healthy looking surface. The laceration area just distal to the wrist on the dorsal hand on the right is also just about closed I used Hydrofera Blue here 2/6; left anterior lower extremity wound is much smaller but still open. The laceration area just distal to the wrist on the dorsal hand is fully epithelialized. 2/13; the patient's anterior lower extremity wound is closed. The laceration just distal to the wrist on the dorsal hand is also fully epithelialized and closed. The patient has external compression stockings which I think are 20/30 READMISSION 08/06/2019 [name] is a 74 year old man with had several times previously in our clinic. He is a diabetic with a history of chronic renal insufficiency status post kidney transplant in 1983 and again in 2005. He was then in 2017 with a laceration on the left lower extremity. He was worked up at the time with arterial studies and reflux studies. The arterial studies showed ABIs to be noncompressible but TBI's were within normal limits. I do not have the reflux studies at the moment. He was also  sent here in 2019 with a left lower extremity wound and then again in 2020 with left lower extremity trauma a skin tear on the wrist. He was discharged with 20/30 stockings identified from myself that that might not be enough compression. Nevertheless he states he was wearing these fairly reliably. In September he had a fall with a substantial bruise in the area of the wound. He says he saw orthopedics and they told him there was some muscle strain sometime it later this opened into a wound. He has a fairly substantial wound on the right posterior calf. Satellite areas around this  including medially and posteriorly. He has not worn his stockings since the injury Past medical history; includes chronic renal failure secondary to diabetes with kidney transplant x2, atrial fibrillation, heart failure with preserved ejection fraction, coronary artery disease. ABIs on the right in our clinic were once again noncompressible 08/13/2019 on evaluation today patient appears to be doing decently well with regard to his wound compared to last week's evaluation. Unfortunately he is still having a lot of discomfort at this point which is I think in some part due to the 3 layer compression wrap which is a little bit stronger I think for him. When he was here before we actually utilized a Kerlix and Coban wrap which he states seemed to got a little bit better. Nonetheless I think we can probably drop back to this in light of the discomfort that he had. Nonetheless the pain was not really right around the wound itself as much as it was around the ankle in particular. The Iodoflex does seem to have done well for him As the wound is appearing somewhat better today which is excellent news. 11/30; still complaining of a lot of pain. Apparently arterial studies I ordered 2 weeks ago are below. I do not believe we have an appointment with vein and vascular as of yet; ABI Findings: +---------+------------------+-----+----------+--------+  Right  Rt Pressure (mmHg) Index Waveform  Comment   +---------+------------------+-----+----------+--------+  PTA  >254  1.50  monophasic    +---------+------------------+-----+----------+--------+  DP  >254  1.50  monophasic    +---------+------------------+-----+----------+--------+  Great T oe 68  0.40  Abnormal     +---------+------------------+-----+----------+--------+ +---------+------------------+-----+----------+-------+  Left  Lt Pressure (mmHg) Index Waveform  Comment  +---------+------------------+-----+----------+-------+  Brachial  169          +---------+------------------+-----+----------+-------+  PTA  >254  1.50  monophasic    +---------+------------------+-----+----------+-------+  DP  >254  1.50  monophasic    +---------+------------------+-----+----------+-------+  Great T oe 40  0.24  Abnormal     +---------+------------------+-----+----------+-------+ Pedal arteries appear hyperemic. Patient refused Brachial pressure in the right arm. Summary: Right: Resting right ankle-brachial index indicates noncompressible right lower extremity arteries. The right toe-brachial index is abnormal. Left: Resting left ankle-brachial index indicates noncompressible left lower extremity arteries. The left toe-brachial index is abnormal. He is also having considerably more swelling in his left calf. This was not there when I last saw him 2 weeks ago. He tells me that some of the home health compression wraps have been slipping down and that may be the issue here however a month I am uncertain 15/1; sees vascular on December 22. Still complaining of a lot of pain. DVT study I did last time was negative for DVT 12/14; still complaining of pain which if this is arterial is certainly claudication and rest keeps him uncomfortable at night. He has an appointment with vein and vascular on December 22. Wound  surface is better using Iodoflex. Once the surface of this is satisfactory and we have exhausted the vascular route. Perhaps an advanced treatment option. He has a configuration of the venous ulceration although his arterial studies are not very good. The other issue is the patient has a transplanted kidney. This will make angiography difficult and challenging issue 12/21; still complaining of pain and drainage. We are using Iodoflex on the wound under compression. He sees Dr. Donnetta Hutching tomorrow to evaluate his noninvasive studies noted above. He has a transplanted kidney further complicating the options for angiography. 12/28; still using Iodoflex  under compression. I have Dr. Luther Parody note from 12/22. he noted arterial studies revealing monophasic waveforms at the pedal vessels bilaterally and calcified vessels making the ABI unreliable. He did not comment on the reduced TBI's. He felt these were venous wounds based on the palpable dorsalis pedis pulse. 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 And they arranged for formal venous duplex with reflux studies in the next several weeks. Follow-up with either Scot Dock or Dr. Oneida Alar 09/24/2019 still using Iodoflex under compression. He has an appointment with Dr. Doren Custard on 1/7 with regards to his venous disease. The patient was not felt to have a primary arterial problem for the nonhealing of his wound. We did attempt to wrap him with 3 layer when he first came into the clinic he complained of a lot of pain in the ankle although this may have been because the dressing fell down somewhat. He has far too edema fluid in the right leg for a good prognosis about healing this wound He comes in today with an excoriation on the bottom part of his right fifth toe. He thinks he may have done this taking off his clothes and that something got caught on the toe. There is no open wound however the toe itself is very painful 1/11; we are using Iodoflex under compression. The wound bed is clean. He went on to see Dr. Doren Custard on 09/27/2019 he again feels that the patient's arterial supply is adequate. He feels that he might benefit from right greater saphenous vein ablation for a venous ulcer. In the meantime the area that he was complaining about last week on the right fifth toe. An x-ray that I ordered showed marked soft tissue swelling along the distal aspect of the right foot but there was no evidence of osteomyelitis. He comes in today with the fifth toenail just about coming off. He has black eschar underneath the toe on the plantar aspect. The toe was swollen red and very painful. In the right setting this could  be a significant soft tissue infection versus ischemia of the toe itself. He did not show this to Dr. Doren Custard 1/18; I am using Iodoflex under compression a large wound on the right posterior calf. He had his right greater saphenous vein ablation by Dr. Doren Custard although I am not sure that is the only problem here. ooThe right fifth toe which was possibly trauma 2 weeks ago continues to be exceptionally painful with a necrotic tip. Maybe not quite as swollen. I started him empirically on Augmentin 5/125 1 p.o. twice daily last week after discussing this with Dr. Loletha Grayer of nephrology. Perhaps somewhat better this week but not as good as I was hoping. A plain x-ray was negative. He comes in today with an area on the medial right calf that was blistered and now open. In looking at things he appears to be systemically fluid volume overloaded. He  has a transplanted kidney. He states he takes his Lasix variably when he has appointments he tries not to take it the later I am really not certain if he takes this reliably. However he has far too much edema fluid in the right leg to easily heal this wound and he appears to be developing blisters medially to form additional wounds 1/25; his CO2 angiogram was done by Dr. Doren Custard last week. This showed the proximal arteries all to be patent. On the right side the common femoral deep femoral superficial femoral popliteal anterior tibial and peroneal arteries were patent. The posterior tibial was occluded but reconstituted distally via collaterals from the peroneal artery he was felt he should have enough blood flow to heal his wounds including the right fifth toe. The right fifth toe looks better however he is still complaining of a lot of pain. The large area which is a venous ulceration posteriorly has a better looking surface I think we can switch to Hydrofera Blue today 2/1; the patient's original wounds on the right posterior medial calf has come down in width however  superior to this he has new denuded areas and I am concerned we simply do not have enough edema control. He has already undergone a right greater saphenous ablation. He had a CO2 angiogram done by Dr. Doren Custard and the comment was that we should have enough blood flow to heal the venous wound however we simply do not seem to have enough edema control with 3 layer compression. The patient is status post kidney transplant although his exact renal function is not really clear. Nor am i sure what dose of diuretic he is supposed to be on. He also has the area on the right fifth toe which was unclear etiology but I think became secondarily infected I gave him 2 weeks of antibiotics for this and this seems to have settled down he still has a black eschar over the tip of the toe. X-ray was negative for fracture. He says he has a history of gout. 2/8; the patient's wound on the right posterior calf is about the same. The superficial area medially also about the same. He got a prescription for prednisoneo Gout after he developed erythema on the dorsal aspect of his left great toe going along with the right fifth toe which has been problematic all along. He has not taken it because he is concerned about increasing CBGs. He has a transplanted kidney is already on prednisone 5 mg. He would not be a good candidate for NSAIDs. Perhaps colchicine. He is not aware of what his uric acid level is 2/15; his right posterior calf wound seems to be coming in in terms of width. Everything here looks fairly good. No mechanical debridement we have been using Hydrofera Blue. He has had an ablation by vein and vascular. Felt to have adequate arterial supply to this area. The patient got prednisone last week from Dr. Angelique Holm of nephrology. At my urging he actually took it. The area on his left toe was a lot better. The right toe was not as painful but still erythematous with a wound at the tip. We have been using silver alginate  here 2/22; right posterior calf seems to be gradually epithelializing. Still a fairly substantial wound. He still has an area on the tip of his right fifth toe which I think was gout related. This is gradually improving. We have been using Unna boots to wrap X-ray I did of the foot last  time was again negative there was soft tissue irregularity about the distal fifth digit but no radiographic evidence of bone damage 3/1; right posterior calf seems to be gradually improving however there were areas of hyper granulation. We have been using Hydrofera Blue. The hyper granulation is mostly evident in the most superior finger shape projection of the wound itself. ooThe area on the right fifth toe also was slough covered and required debridement. 3/8 continued improvement in the right posterior calf and the tip of the right fifth toe. We have been using Hydrofera Blue under compression he is changing the area to the toe 3/22; continued improvement in the right posterior calf. Unfortunately comes in today with a new skin tear in the mid anterior tibia area this probably had something to do with changing his dressing home health I called and left this a message last week. 3/29; continued improvement in the a substantial wound on the right posterior calf. The skin tear anteriorly from last week is already healed on the tip of the right fifth toe there is still a nonviable area. 4/5; we have continued contraction of the substantial wound on the right posterior calf. We continue to have problems with the tip of the right fifth toe. We have been using Hydrofera Blue to both wound areas He is complaining about some discomfort in the Achilles area. T me he had surgery for what sounds like a torn Achilles about 10 years ago. ells 4/12; we have continued contraction of the substantial wound on the right posterior calf. ooStill an area on the tip of the right fifth toe. I changed him to silver collagen on the toe  last week but I think home health continue to use Hydrofera Blue to both wound areas. 4/19; we continue to have contraction of the substantial wound on the right posterior calf however there continues to be hyper granulation. ooThe area on the tip of the fifth toe is very small still some debris on the surface we have been using silver collagen 4/26; both wounds have contracted. I was hoping the fifth toe wound would close over but with probing there is still an open small hole here. 5/3; his wound on the right posterior calf which was his major area continues to contract looks healthy we have been using Hydrofera Blue. Using silver collagen to the fifth toe, not making a lot of progress here 5/10; right posterior calf wound continues to be smaller in terms of surface area and look healthy we have been using Hydrofera Blue here. The tip of the right fifth toe is still open requiring debridement. He has a new laceration on the right forearm he says he hit this on a microwave 6 days ago 02/11/20-The right fifth toe wound is closed, the right posterior calf wound looks healthy with smaller dimensions compared with last time, the right forearm area of laceration has a small blister which I try to open up with scalpel with very little fluid 6/7 the right fifth toe looks fine. Area on the right forearm is also closed. Right posterior calf wound continues to contract. Much smaller area remaining with healthy surface. We are using Hydrofera Blue under compression 6/21; area on the right posterior calf. There is only 1 small area in the most distal part of this. We have been using Hydrofera Blue under compression this is contracting surface looks healthy 6/28; right posterior calf this is healed. This was initially a hematoma. During the stay in this clinic he  had a right greater saphenous vein ablation by Dr. Doren Custard of vein and vascular. He also had a difficult time with his right fifth toeo Infection versus  gout. Very difficult time with this so that this has been closed for about 6 weeks. ------------------- READMISSION 04/29/20-Patient returns to the clinic this time with a right index finger swelling and pain been drainage, patient has been to the hand surgeon and vascular surgeon and ended up having a small IandD attempted in the office with expression of some gritty whitish material, patient also hit his hand and developed a small hematoma around this area. He also had vascular Dopplers done that show monophasic right radial right ulnar waveforms. He has follow-up appointments with vascular surgery as well as the hand surgeon. His appointment today with a hand surgeon he was told that he may lose the tip of his index finger if surgery is done to open up the entire area. He is reluctant to have any surgery and would like to try conservative measures He has tried epsom salt soaks with improvement then worsening , he has just finished a course of antibiotics, he does not remember the name 8/16; patient was readmitted to the clinic last week and I am seeing this for the first time. Previously we had looked at this man for a large wound in the right posterior calf that was initially a hematoma. He went on to have a greater saphenous vein ablation. During the stay here he also had a difficult time with an area over his right fifth toe question infectious question gout at that time. This eventually closed over although I do not think the differential was ever really totally clear in my mind. The patient is here this time with a necrotic area on the palmar surface of his right index finger. He states this started in July with a small red area. He spent some time going to see Dr. Jarome Matin of dermatology. Ended up with Dr. 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 He is also seen hand surgery which is fortunate is he is likely to require either debridement  or amputation of this finger. He had a course of antibiotics Keflex and doxycycline I believe but he is finished these. Currently using Betadine. He describes this is very painful. I have not had a chance to look back over notes in care everywhere. I would like to see as hand surgery notes, vascular surgery notes and dermatology notes if this is possible. He also apparently had an x-ray ordered by Dr. Amedeo Plenty that I would like to review. 7/61; certainly no improvement in his right index finger palmar aspect. Culture I did last time showed abundant Enterobacter and abundant Proteus. I have him on cefdinir 300 mg every 24 as of Friday in response to this. This was a deep tissue culture. 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 He still complaining of a lot of pain. READMISSION 07/04/2020 [name] is a 74 year old man with wounds on his bilateral lower extremities and bilateral upper extremities which were apparently the consequence of being found down at home in early September. He required admission to the hospital from 9/6 through 9/17 with delirium, hyponatremia, sepsis and type 2 diabetes. He gradually improved. He was sent to a nursing home in [place] and recently has been discharged home. They are using some foam-based dressings on his bilateral lower and upper extremity wounds as well as  his left fifth toe which apparently are felt to be secondary from the prolonged friction and pressure of being recumbent. We have had [name] in the clinic on 2 different occasions. Predominantly the first time for a large wound on the right posterior calf related to trauma and chronic venous insufficiency. He was here in August with a necrotic wound on the tip of his right index finger which ultimately required amputation this is still currently wrapped. Past medical history most importantly includes type 2 diabetes with a prior kidney  transplant, atrial fibrillation, heart failure with preserved with preserved ejection fraction, chronic prednisone use, he says his Lasix was held when he was in skilled nursing and only recently been started by Dr. Vanita Panda of nephrology He has not been previously felt to have an arterial issue previous ABIs were noncompressible 10/22; readmitted to the clinic last week. His wounds on the upper extremities are healed as well as the right lower extremity. He still has an area on the left lateral calf and the lateral part of his fifth toe. We have been using silver alginate. He looks like he has had a lot of edema improvement with the recent addition to his diuretics by Dr. Jonn Shingles of nephrology 10/29;. He has 2 wounds on the left lateral calf and the dorsal aspect the left 5th toe nonviable surface. We are using silver alginate Objective Constitutional Patient is hypertensive.. Pulse regular and within target range for patient.Marland Kitchen Respirations regular, non-labored and within target range.. Temperature is normal and within the target range for the patient.Marland Kitchen Appears in no distress. Vitals Time Taken: 9:16 AM, Height: 68 in, Weight: 192 lbs, BMI: 29.2, Temperature: 97.5 F, Pulse: 71 bpm, Respiratory Rate: 19 breaths/min, Blood Pressure: 177/81 mmHg. General Notes: Wound exam; no improvement from last week still nonviable surfaces on these areas #5 curette debridement which she never tolerates very well. Hemostasis with direct pressure. We will change the dressing to Iodoflex. His edema is well controlled pedal pulses are palp Integumentary (Hair, Skin) Wound #12 status is Open. Original cause of wound was Trauma. The wound is located on the Left,Lateral Lower Leg. The wound measures 2cm length x 1.1cm width x 0.1cm depth; 1.728cm^2 area and 0.173cm^3 volume. There is Fat Layer (Subcutaneous Tissue) exposed. There is no tunneling or undermining noted. There is a medium amount of serosanguineous  drainage noted. The wound margin is distinct with the outline attached to the wound base. There is medium (34- 66%) pink granulation within the wound bed. There is a medium (34-66%) amount of necrotic tissue within the wound bed including Adherent Slough. Wound #13 status is Open. Original cause of wound was Gradually Appeared. The wound is located on the Left T Fifth. The wound measures 1cm length x oe 0.5cm width x 0.2cm depth; 0.393cm^2 area and 0.079cm^3 volume. There is Fat Layer (Subcutaneous Tissue) exposed. There is no tunneling or undermining noted. There is a medium amount of serosanguineous drainage noted. The wound margin is distinct with the outline attached to the wound base. There is medium (34-66%) pink granulation within the wound bed. There is a medium (34-66%) amount of necrotic tissue within the wound bed including Adherent Slough. Assessment Active Problems ICD-10 Non-pressure chronic ulcer of left calf with fat layer exposed Non-pressure chronic ulcer of other part of left foot limited to breakdown of skin Non-pressure chronic ulcer of right calf limited to breakdown of skin Abrasion of left upper arm, subsequent encounter Abrasion of right upper arm, subsequent encounter Type  2 diabetes mellitus with other skin ulcer Chronic venous hypertension (idiopathic) with inflammation of bilateral lower extremity Procedures Wound #12 Pre-procedure diagnosis of Wound #12 is a Diabetic Wound/Ulcer of the Lower Extremity located on the Left,Lateral Lower Leg .Severity of Tissue Pre Debridement is: Fat layer exposed. There was a Excisional Skin/Subcutaneous Tissue Debridement with a total area of 2.2 sq cm performed by [name]., MD. With the following instrument(s): Curette to remove Viable and Non-Viable tissue/material. Material removed includes Subcutaneous Tissue and Slough and after achieving pain control using Other (benzocaine 20% spray). No specimens were taken. A  time out was conducted at 09:50, prior to the start of the procedure. A Minimum amount of bleeding was controlled with Pressure. The procedure was tolerated well with a pain level of 4 throughout and a pain level of 2 following the procedure. Post Debridement Measurements: 2cm length x 1.1cm width x 0.1cm depth; 0.173cm^3 volume. Character of Wound/Ulcer Post Debridement requires further debridement. Severity of Tissue Post Debridement is: Fat layer exposed. 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 There was a Three Layer Compression Therapy Procedure by [name], RN. Post procedure Diagnosis Wound #12: Same as Pre-Procedure Wound #13 Pre-procedure diagnosis of Wound #13 is a Diabetic Wound/Ulcer of the Lower Extremity located on the Left T Fifth .Severity of Tissue Pre Debridement is: oe Fat layer exposed. There was a Excisional Skin/Subcutaneous Tissue Debridement with a total area of 0.5 sq cm performed by [name]., MD. With the following instrument(s): Curette to remove Viable and Non-Viable tissue/material. Material removed includes Subcutaneous Tissue and Slough and after achieving pain control using Other (benzocaine 20% spray). No specimens were taken. A time out was conducted at 09:50, prior to the start of the procedure. A Minimum amount of bleeding was controlled with Pressure. The procedure was tolerated well with a pain level of 7 throughout and a pain level of 4 following the procedure. Post Debridement Measurements: 1cm length x 0.5cm width x 0.2cm depth; 0.079cm^3 volume. Character of Wound/Ulcer Post Debridement requires further debridement. Severity of Tissue Post Debridement is: Fat layer exposed. Post procedure Diagnosis Wound #13: Same as Pre-Procedure Plan Follow-up Appointments: Return Appointment in 1 week. Dressing  Change Frequency: Wound #12 Left,Lateral Lower Leg: Other: - 2 times per week (once at wound center, once at wound center) all wounds Wound #13 Left T Fifth: oe Other: - 2 times per week (once at wound center, once at wound center) all wounds Skin Barriers/Peri-Wound Care: Moisturizing lotion - to left leg with dressing changes and right leg daily Wound Cleansing: May shower with protection. May shower and wash wound with soap and water. - on days when dressings are changed Primary Wound Dressing: Wound #12 Left,Lateral Lower Leg: Iodoflex Wound #13 Left T Fifth: oe Iodoflex Secondary Dressing: Wound #12 Left,Lateral Lower Leg: Dry Gauze Wound #13 Left T Fifth: oe Kerlix/Rolled Gauze Dry Gauze Edema Control: 3 Layer Compression System - Left Lower Extremity Avoid standing for long periods of time Elevate legs to the level of the heart or above for 30 minutes daily and/or when sitting, a frequency of: - whenever sitting throughout the day Exercise regularly Support Garment 20-30 mm/Hg pressure to: - compression stocking to right leg daily, apply in first thing in the morning and remove at night Home Health: [facility] for wound care. - [facility] The  following medication(s) was prescribed: benzocaine topical 20 % aerosol aerosol topical for prior to debridement was prescribed at facility #1 I change the primary dressing to Iodoflex. He has home health changing the dressing on his toe and were keeping the leg in compression with 3 layer compression Electronic Signature(s) Signed: 07/21/2020 2:10:44 PM By: Michael Ham MD Entered By: [name] on 07/18/2020 10:01:43 -------------------------------------------------------------------------------- SuperBill Details Patient Name: Date of Service: KAWHI, DIEBOLD HA M 07/18/2020 Medical Record Number: [id number] Patient Account Number: [id number] Date of Birth/Sex: Treating RN: 03/30/1946 (74 y.o.  [name] Primary Care Provider: Donetta [name] Other Clinician: Referring Provider: Treating Provider/Extender: [name] in Treatment: 2 Diagnosis Coding ICD-10 Codes Code Description [phone] Non-pressure chronic ulcer of left calf with fat layer exposed 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 of skin L97.211 Non-pressure chronic ulcer of right calf limited to breakdown of skin S40.812D Abrasion of left upper arm, subsequent encounter S40.811D Abrasion of right upper arm, subsequent encounter E11.622 Type 2 diabetes mellitus with other skin ulcer I87.323 Chronic venous hypertension (idiopathic) with inflammation of bilateral lower extremity Facility Procedures CPT4 Code: [id number] Description: 14431 - DEB SUBQ TISSUE 20 SQ CM/< ICD-10 Diagnosis Description 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 of Modifier: skin Quantity: 1 Physician Procedures : CPT4 Code Description Modifier [id number] - WC PHYS SUBQ TISS 20 SQ CM ICD-10 Diagnosis Description 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.521 Non-pressure chronic ulcer of other part of left foot limited to  breakdown of skin Quantity: 1 Electronic Signature(s) Signed: 07/21/2020 2:10:44 PM By: Michael Ham MD Entered By: [name] on 07/18/2020 10:01:53

## 2020-07-21 NOTE — Progress Notes (Signed)
CASSIEL, FERNANDEZ ([id number]) Visit Report for 07/18/2020 Arrival Information Details Patient Name: Date of Service: [name], [name] 07/18/2020 9:00 [name] Medical Record Number: [id number] Patient Account Number: [id number] Date of Birth/Sex: Treating RN: 12-Aug-1946 (74 y.o. [name], [name] Primary Care [name]: [name] Other Clinician: Referring [name]: Treating [name]/Extender: [name] in Treatment: 2 Visit Information History Since Last Visit Added or deleted any medications: No Patient Arrived: Ambulatory Any new allergies or adverse reactions: No Arrival Time: 09:16 Had a fall or experienced change in No Accompanied By: self activities of daily living that may affect Transfer Assistance: None risk of falls: Patient Identification Verified: Yes Signs or symptoms of abuse/neglect since last visito No Secondary Verification Process Completed: Yes Hospitalized since last visit: No Patient Requires Transmission-Based Precautions: No Implantable device outside of the clinic excluding No Patient Has Alerts: No cellular tissue based products placed in the center since last visit: Has Dressing in Place as Prescribed: Yes Pain Present Now: Yes Electronic Signature(s) Signed: 07/21/2020 8:21:27 AM By: [name] Entered By: [name] on 07/18/2020 09:16:50 -------------------------------------------------------------------------------- Compression Therapy Details Patient Name: Date of Service: [name], [name] 07/18/2020 9:00 [name] Medical Record Number: [id number] Patient Account Number: [id number] Date of Birth/Sex: Treating RN: 07/21/1946 (74 y.o. [name] Primary Care Rainn Zupko: [name] Other Clinician: Referring [name]: Treating [name]/Extender: [name] in Treatment: 2 Compression Therapy Performed for Wound Assessment: Wound #12 Left,Lateral  Lower Leg Performed By: Clinician [name], RN Compression Type: Three Layer Post Procedure Diagnosis Same as Pre-procedure Electronic Signature(s) Signed: 07/18/2020 4:23:42 PM By: [name] RN, BSN Entered By: [name] on 07/18/2020 09:56:19 -------------------------------------------------------------------------------- Encounter Discharge Information Details Patient Name: Date of Service: [name], [name] 07/18/2020 9:00 [name] Medical Record Number: [id number] Patient Account Number: [id number] Date of Birth/Sex: Treating RN: 25-Sep-1945 (74 y.o. [name] Primary Care Tajae Rybicki: [name] Other Clinician: Referring [name]: Treating [name]/Extender: [name] in Treatment: 2 Encounter Discharge Information Items Post Procedure Vitals Discharge Condition: Stable Temperature (F): 97.5 Ambulatory Status: Ambulatory Pulse (bpm): 71 Discharge Destination: Home Respiratory Rate (breaths/min): 19 Transportation: Private Auto Blood Pressure (mmHg): 177/81 Accompanied By: self Schedule Follow-up Appointment: Yes Clinical Summary of Care: Electronic Signature(s) Signed: 07/18/2020 4:26:07 PM By: [name] Entered By: [name] on 07/18/2020 10:25:55 -------------------------------------------------------------------------------- Lower Extremity Assessment Details Patient Name: Date of Service: [name], [name] 07/18/2020 9:00 [name] Medical Record Number: [id number] Patient Account Number: [id number] Date of Birth/Sex: Treating RN: 1945-12-22 (74 y.o. [name] Primary Care Sander Remedios: [name] Other Clinician: Referring [name]: Treating [name]/Extender: [name] in Treatment: 2 Edema Assessment Assessed: Shirlyn Goltz: No] [Right: No] Edema: [Left: Ye] [Right: s] Calf Left: Right: Point of Measurement: 42 cm From Medial Instep 40 cm 35  cm Ankle Left: Right: Point of Measurement: 18 cm From Medial Instep 22.5 cm 22.5 cm Vascular Assessment Pulses: Dorsalis Pedis Palpable: [Left:Yes] Electronic Signature(s) Signed: 07/21/2020 5:57:05 PM By: [name] RN, BSN Entered By: [name] on 07/18/2020 09:28:00 -------------------------------------------------------------------------------- Multi Wound Chart Details Patient Name: Date of Service: [name] 07/18/2020 9:00 [name] Medical Record Number: [id number] Patient Account Number: [id number] Date of Birth/Sex: Treating RN: Dec 31, 1945 (74 y.o. [name] Primary Care Allyse Fregeau: [name] Other Clinician: Referring [name]: Treating [name]/Extender: [name] in Treatment: 2  Vital Signs Height(in): 68 Pulse(bpm): 71 Weight(lbs): 192 Blood Pressure(mmHg): 177/81 Body Mass Index(BMI): 29 Temperature(F): 97.5 Respiratory Rate(breaths/min): 19 Photos: [12:No Photos Left, Lateral Lower Leg] [13:No Photos Left T Fifth oe] [N/A:N/A N/A] Wound Location: [12:Trauma] [13:Gradually Appeared] [N/A:N/A] Wounding Event: [12:Diabetic Wound/Ulcer of the Lower] [13:Diabetic Wound/Ulcer of the Lower] [N/A:N/A] Primary Etiology: [12:Extremity Cataracts, Anemia, Arrhythmia,] [13:Extremity Cataracts, Anemia, Arrhythmia,] [N/A:N/A] Comorbid History: [12:Congestive Heart Failure, Hypertension, Type II Diabetes, Gout, Hypertension, Type II Diabetes, Gout, Neuropathy 05/26/2020] [13:Congestive Heart Failure, Neuropathy 06/18/2020] [N/A:N/A] Date Acquired: [12:2] [13:2] [N/A:N/A] Weeks of Treatment: [12:Open] [13:Open] [N/A:N/A] Wound Status: [12:2x1.1x0.1] [13:1x0.5x0.2] [N/A:N/A] Measurements L x W x D (cm) [12:1.728] [13:0.393] [N/A:N/A] A (cm) : rea [12:0.173] [13:0.079] [N/A:N/A] Volume (cm) : [12:50.00%] [13:30.40%] [N/A:N/A] % Reduction in A [12:rea: 87.50%] [13:-38.60%] [N/A:N/A] % Reduction in Volume:  [12:Grade 2] [13:Grade 2] [N/A:N/A] Classification: [12:Medium] [13:Medium] [N/A:N/A] Exudate A mount: [12:Serosanguineous] [13:Serosanguineous] [N/A:N/A] Exudate Type: [12:red, brown] [13:red, brown] [N/A:N/A] Exudate Color: [12:Distinct, outline attached] [13:Distinct, outline attached] [N/A:N/A] Wound Margin: [12:Medium (34-66%)] [13:Medium (34-66%)] [N/A:N/A] Granulation A mount: [12:Pink] [13:Pink] [N/A:N/A] Granulation Quality: [12:Medium (34-66%)] [13:Medium (34-66%)] [N/A:N/A] Necrotic A mount: [12:Fat Layer (Subcutaneous Tissue): Yes Fat Layer (Subcutaneous Tissue): Yes N/A] Exposed Structures: [12:Fascia: No Tendon: No Muscle: No Joint: No Bone: No Small (1-33%)] [13:Fascia: No Tendon: No Muscle: No Joint: No Bone: No Small (1-33%)] [N/A:N/A] Epithelialization: [12:Debridement - Excisional] [13:Debridement - Excisional] [N/A:N/A] Debridement: Pre-procedure Verification/Time Out 09:50 [13:09:50] [N/A:N/A] Taken: [12:Other] [13:Other] [N/A:N/A] Pain Control: [12:Subcutaneous, Slough] [13:Subcutaneous, Slough] [N/A:N/A] Tissue Debrided: [12:Skin/Subcutaneous Tissue] [13:Skin/Subcutaneous Tissue] [N/A:N/A] Level: [12:2.2] [13:0.5] [N/A:N/A] Debridement A (sq cm): [12:rea Curette] [13:Curette] [N/A:N/A] Instrument: [12:Minimum] [13:Minimum] [N/A:N/A] Bleeding: [12:Pressure] [13:Pressure] [N/A:N/A] Hemostasis A chieved: [12:4] [13:7] [N/A:N/A] Procedural Pain: [12:2] [13:4] [N/A:N/A] Post Procedural Pain: [12:Procedure was tolerated well] [13:Procedure was tolerated well] [N/A:N/A] Debridement Treatment Response: [12:2x1.1x0.1] [13:1x0.5x0.2] [N/A:N/A] Post Debridement Measurements L x W x D (cm) [12:0.173] [13:0.079] [N/A:N/A] Post Debridement Volume: (cm) [12:Compression Therapy] [13:Debridement] [N/A:N/A] Procedures Performed: [12:Debridement] Treatment Notes Electronic Signature(s) Signed: 07/18/2020 4:23:42 PM By: [name] RN, BSN Signed: 07/21/2020 2:10:44 PM By:  [name] MD Entered By: [name] on 07/18/2020 09:59:15 -------------------------------------------------------------------------------- Multi-Disciplinary Care Plan Details Patient Name: Date of Service: [name], [name] 07/18/2020 9:00 [name] Medical Record Number: [id number] Patient Account Number: [id number] Date of Birth/Sex: Treating RN: 1946-03-20 (74 y.o. [name] Primary Care Tranesha Lessner: [name] Other Clinician: Referring Meighan Treto: Treating Kelicia Youtz/Extender: [name] in Treatment: 2 Active Inactive Abuse / Safety / Falls / Self Care Management Nursing Diagnoses: Potential for falls Goals: Patient/caregiver will verbalize/demonstrate measures taken to prevent injury and/or falls Date Initiated: 07/04/2020 Target Resolution Date: 08/01/2020 Goal Status: Active Interventions: Assess fall risk on admission and as needed Assess impairment of mobility on admission and as needed per policy Notes: Nutrition Nursing Diagnoses: Impaired glucose control: actual or potential Potential for alteratiion in Nutrition/Potential for imbalanced nutrition Goals: Patient/caregiver will maintain therapeutic glucose control Date Initiated: 07/04/2020 Target Resolution Date: 08/01/2020 Goal Status: Active Interventions: Assess HgA1c results as ordered upon admission and as needed Assess patient nutrition upon admission and as needed per policy Treatment Activities: Patient referred to Primary Care Physician for further nutritional evaluation : 07/04/2020 Notes: Venous Leg Ulcer Nursing Diagnoses: Knowledge deficit related to disease process and management Potential for venous Insuffiency (use before diagnosis confirmed) Goals: Patient will maintain optimal edema control Date Initiated: 07/04/2020 Target Resolution Date: 08/01/2020 Goal Status: Active Interventions: Assess peripheral edema status every  visit. Compression  as ordered Provide education on venous insufficiency Treatment Activities: Therapeutic compression applied : 07/04/2020 Notes: Wound/Skin Impairment Nursing Diagnoses: Impaired tissue integrity Knowledge deficit related to ulceration/compromised skin integrity Goals: Patient/caregiver will verbalize understanding of skin care regimen Date Initiated: 07/04/2020 Target Resolution Date: 08/01/2020 Goal Status: Active Ulcer/skin breakdown will have a volume reduction of 30% by week 4 Date Initiated: 07/04/2020 Target Resolution Date: 08/01/2020 Goal Status: Active Interventions: Assess patient/caregiver ability to obtain necessary supplies Assess patient/caregiver ability to perform ulcer/skin care regimen upon admission and as needed Assess ulceration(s) every visit Provide education on ulcer and skin care Treatment Activities: Skin care regimen initiated : 07/04/2020 Topical wound management initiated : 07/04/2020 Notes: Electronic Signature(s) Signed: 07/18/2020 4:23:42 PM By: [name] RN, BSN Entered By: [name] on 07/18/2020 09:49:09 -------------------------------------------------------------------------------- Pain Assessment Details Patient Name: Date of Service: [name], [name] 07/18/2020 9:00 [name] Medical Record Number: [id number] Patient Account Number: [id number] Date of Birth/Sex: Treating RN: [birth year] (74 y.o. [name] Primary Care Kay Shippy: [name] Other Clinician: Referring Mohamedamin Nifong: Treating Zurie Platas/Extender: [name] in Treatment: 2 Active Problems Location of Pain Severity and Description of Pain Patient Has Paino Yes Site Locations Rate the pain. Current Pain Level: 6 Pain Management and Medication Current Pain Management: Electronic Signature(s) Signed: 07/18/2020 4:23:42 PM By: [name] RN, BSN Signed: 07/21/2020 8:21:27 AM By: [name] Entered By: [name] on 07/18/2020 09:17:16 -------------------------------------------------------------------------------- Patient/Caregiver Education Details Patient Name: Date of Service: [name], [name] 10/29/2021andnbsp9:00 [name] Medical Record Number: [id number] Patient Account Number: [id number] Date of Birth/Gender: Treating RN: June 03, 1946 (74 y.o. [name] Primary Care Physician: [name] Other Clinician: Referring Physician: Treating Physician/Extender: [name] in Treatment: 2 Education Assessment Education Provided To: Patient Education Topics Provided Venous: Methods: Explain/Verbal Responses: Reinforcements needed, State content correctly Wound/Skin Impairment: Methods: Explain/Verbal Responses: Reinforcements needed, State content correctly Electronic Signature(s) Signed: 07/18/2020 4:23:42 PM By: [name] RN, BSN Entered By: [name] on 07/18/2020 09:50:58 -------------------------------------------------------------------------------- Wound Assessment Details Patient Name: Date of Service: [name], [name] 07/18/2020 9:00 [name] Medical Record Number: [id number] Patient Account Number: [id number] Date of Birth/Sex: Treating RN: 09-May-1946 (74 y.o. [name] Primary Care Leeloo Silverthorne: [name] Other Clinician: Referring Antoinette Borgwardt: Treating Dennisha Mouser/Extender: [name] in Treatment: 2 Wound Status Wound Number: 12 Primary Diabetic Wound/Ulcer of the Lower Extremity Etiology: Wound Location: Left, Lateral Lower Leg Wound Open Wounding Event: Trauma Status: Date Acquired: 05/26/2020 Comorbid Cataracts, Anemia, Arrhythmia, Congestive Heart Failure, Weeks Of Treatment: 2 History: Hypertension, Type II Diabetes, Gout, Neuropathy Clustered Wound: No Wound Measurements Length: (cm) 2 Width: (cm) 1.1 Depth: (cm)  0.1 Area: (cm) 1.728 Volume: (cm) 0.173 % Reduction in Area: 50% % Reduction in Volume: 87.5% Epithelialization: Small (1-33%) Tunneling: No Undermining: No Wound Description Classification: Grade 2 Wound Margin: Distinct, outline attached Exudate Amount: Medium Exudate Type: Serosanguineous Exudate Color: red, brown Foul Odor After Cleansing: No Slough/Fibrino Yes Wound Bed Granulation Amount: Medium (34-66%) Exposed Structure Granulation Quality: Pink Fascia Exposed: No Necrotic Amount: Medium (34-66%) Fat Layer (Subcutaneous Tissue) Exposed: Yes Necrotic Quality: Adherent Slough Tendon Exposed: No Muscle Exposed: No Joint Exposed: No Bone Exposed: No Treatment Notes Wound #12 (Left, Lateral Lower Leg) 1. Cleanse With Wound Cleanser Soap and water 2. Periwound Care Moisturizing lotion 3. Primary Dressing Applied Iodoflex 4. Secondary Dressing Dry Gauze 6. Support Layer Applied 3 layer compression wrap  Electronic Signature(s) Signed: 07/18/2020 4:23:42 PM By: [name] RN, BSN Signed: 07/21/2020 5:57:05 PM By: [name] RN, BSN Entered By: [name] on 07/18/2020 09:28:14 -------------------------------------------------------------------------------- Wound Assessment Details Patient Name: Date of Service: [name], [name] 07/18/2020 9:00 [name] Medical Record Number: [id number] Patient Account Number: [id number] Date of Birth/Sex: Treating RN: [birth year] (74 y.o. [name] Primary Care Lorenza Winkleman: [name] Other Clinician: Referring Persephonie Hegwood: Treating Nathania Waldman/Extender: [name] in Treatment: 2 Wound Status Wound Number: 13 Primary Diabetic Wound/Ulcer of the Lower Extremity Etiology: Wound Location: Left T Fifth oe Wound Open Wounding Event: Gradually Appeared Status: Date Acquired: 06/18/2020 Comorbid Cataracts, Anemia, Arrhythmia, Congestive Heart Failure, Weeks Of Treatment:  2 History: Hypertension, Type II Diabetes, Gout, Neuropathy Clustered Wound: No Wound Measurements Length: (cm) 1 Width: (cm) 0.5 Depth: (cm) 0.2 Area: (cm) 0.393 Volume: (cm) 0.079 % Reduction in Area: 30.4% % Reduction in Volume: -38.6% Epithelialization: Small (1-33%) Tunneling: No Undermining: No Wound Description Classification: Grade 2 Wound Margin: Distinct, outline attached Exudate Amount: Medium Exudate Type: Serosanguineous Exudate Color: red, brown Foul Odor After Cleansing: No Slough/Fibrino Yes Wound Bed Granulation Amount: Medium (34-66%) Exposed Structure Granulation Quality: Pink Fascia Exposed: No Necrotic Amount: Medium (34-66%) Fat Layer (Subcutaneous Tissue) Exposed: Yes Necrotic Quality: Adherent Slough Tendon Exposed: No Muscle Exposed: No Joint Exposed: No Bone Exposed: No Treatment Notes Wound #13 (Left Toe Fifth) 1. Cleanse With Wound Cleanser 3. Primary Dressing Applied Iodoflex 4. Secondary Dressing Dry Gauze Roll Gauze 5. Secured With Medipore tape Notes netting. Electronic Signature(s) Signed: 07/18/2020 4:23:42 PM By: [name] RN, BSN Signed: 07/21/2020 5:57:05 PM By: [name] RN, BSN Entered By: [name] on 07/18/2020 09:28:34 -------------------------------------------------------------------------------- [place] Details Patient Name: Date of Service: FAVOR, HACKLER HA [name] 07/18/2020 9:00 [name] Medical Record Number: [id number] Patient Account Number: [id number] Date of Birth/Sex: Treating RN: April 07, 1946 (74 y.o. [name] Primary Care Lequisha Cammack: [name] Other Clinician: Referring Goodwin Kamphaus: Treating Kaylan Yates/Extender: [name] in Treatment: 2 Vital Signs Time Taken: 09:16 Temperature (F): 97.5 Height (in): 68 Pulse (bpm): 71 Weight (lbs): 192 Respiratory Rate (breaths/min): 19 Body Mass Index (BMI): 29.2 Blood Pressure (mmHg): 177/81 Reference  Range: 80 - 120 mg / dl Electronic Signature(s) Signed: 07/21/2020 8:21:27 AM By: [name] Entered By: [name] on 07/18/2020 09:17:08

## 2020-07-25 ENCOUNTER — Encounter (HOSPITAL_BASED_OUTPATIENT_CLINIC_OR_DEPARTMENT_OTHER): Payer: Medicare Other | Attending: Internal Medicine | Admitting: Internal Medicine

## 2020-07-25 ENCOUNTER — Other Ambulatory Visit: Payer: Self-pay

## 2020-07-25 DIAGNOSIS — E11621 Type 2 diabetes mellitus with foot ulcer: Secondary | ICD-10-CM | POA: Insufficient documentation

## 2020-07-25 DIAGNOSIS — E1122 Type 2 diabetes mellitus with diabetic chronic kidney disease: Secondary | ICD-10-CM | POA: Diagnosis not present

## 2020-07-25 DIAGNOSIS — N189 Chronic kidney disease, unspecified: Secondary | ICD-10-CM | POA: Diagnosis not present

## 2020-07-25 DIAGNOSIS — E1151 Type 2 diabetes mellitus with diabetic peripheral angiopathy without gangrene: Secondary | ICD-10-CM | POA: Diagnosis not present

## 2020-07-25 DIAGNOSIS — Z940 Kidney transplant status: Secondary | ICD-10-CM | POA: Diagnosis not present

## 2020-07-25 DIAGNOSIS — I2510 Atherosclerotic heart disease of native coronary artery without angina pectoris: Secondary | ICD-10-CM | POA: Insufficient documentation

## 2020-07-25 DIAGNOSIS — E1140 Type 2 diabetes mellitus with diabetic neuropathy, unspecified: Secondary | ICD-10-CM | POA: Insufficient documentation

## 2020-07-25 DIAGNOSIS — L97521 Non-pressure chronic ulcer of other part of left foot limited to breakdown of skin: Secondary | ICD-10-CM | POA: Diagnosis not present

## 2020-07-25 DIAGNOSIS — E11622 Type 2 diabetes mellitus with other skin ulcer: Secondary | ICD-10-CM | POA: Insufficient documentation

## 2020-07-25 DIAGNOSIS — Z7952 Long term (current) use of systemic steroids: Secondary | ICD-10-CM | POA: Diagnosis not present

## 2020-07-25 DIAGNOSIS — I4891 Unspecified atrial fibrillation: Secondary | ICD-10-CM | POA: Insufficient documentation

## 2020-07-25 DIAGNOSIS — I5032 Chronic diastolic (congestive) heart failure: Secondary | ICD-10-CM | POA: Diagnosis not present

## 2020-07-25 DIAGNOSIS — L97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er exposed: Secondary | ICD-10-CM | POA: Diagnosis not present

## 2020-07-25 DIAGNOSIS — D649 Anemia, unspecified: Secondary | ICD-10-CM | POA: Insufficient documentation

## 2020-07-25 DIAGNOSIS — I130 Hypertensive heart and chronic kidney disease with heart failure and stage 1 through stage 4 chronic kidney disease, or unspecified chronic kidney disease: Secondary | ICD-10-CM | POA: Insufficient documentation

## 2020-07-25 DIAGNOSIS — K219 Gastro-esophageal reflux disease without esophagitis: Secondary | ICD-10-CM | POA: Insufficient documentation

## 2020-07-28 NOTE — Progress Notes (Signed)
[name], [name] ([id number]) Visit Report for 07/25/2020 Debridement Details Patient Name: Date of Service: [name], [name] 07/25/2020 1:15 PM Medical Record Number: [id number] Patient Account Number: [id number] Date of Birth/Sex: Treating RN: 06/21/1946 (74 y.o. [name] Primary Care Provider: [name] Other Clinician: Referring Provider: Treating Provider/Extender: [name] in Treatment: 3 Debridement Performed for Assessment: Wound #13 Left T Fifth oe Performed By: Physician Ricard Dillon., MD Debridement Type: Debridement Severity of Tissue Pre Debridement: Fat layer exposed Level of Consciousness (Pre-procedure): Awake and Alert Pre-procedure Verification/Time Out Yes - 13:30 Taken: Start Time: 13:32 Pain Control: Other : benzocaine 20% spray T Area Debrided (L x W): otal 1.1 (cm) x 0.5 (cm) = 0.55 (cm) Tissue and other material debrided: Viable, Non-Viable, Slough, Subcutaneous, Slough Level: Skin/Subcutaneous Tissue Debridement Description: Excisional Instrument: Curette Bleeding: Minimum Hemostasis Achieved: Pressure End Time: 13:35 Procedural Pain: 7 Post Procedural Pain: 4 Response to Treatment: Procedure was tolerated well Level of Consciousness (Post- Awake and Alert procedure): Post Debridement Measurements of Total Wound Length: (cm) 1.1 Width: (cm) 0.5 Depth: (cm) 0.2 Volume: (cm) 0.086 Character of Wound/Ulcer Post Debridement: Improved Severity of Tissue Post Debridement: Fat layer exposed Post Procedure Diagnosis Same as Pre-procedure Electronic Signature(s) Signed: 07/25/2020 6:06:29 PM By: [name] RN, BSN Signed: 07/28/2020 1:46:42 PM By: [name] MD Entered By: [name] on 07/25/2020 13:43:05 -------------------------------------------------------------------------------- HPI Details Patient Name: Date of Service: Michael, PORTELL HA [name] 07/25/2020 1:15 PM Medical  Record Number: [id number] Patient Account Number: [id number] Date of Birth/Sex: Treating RN: [birth year] (74 y.o. [name] Primary Care Provider: [name] Other Clinician: Referring Provider: Treating Provider/Extender: [name] in Treatment: 3 History of Present Illness HPI Description: [name] HPI Description: 74 year old gentleman who was recently seen by his nephrologist [name], and noted to have a wound on his left lower extremity which was lacerated 2 months ago and now has reopened. The patient's left shin has a ulceration with some exudate but no evidence of infection and he was referred to [place] for further care as it was known that the patient has had some peripheral vascular disease in the past. Past medical history significant for chronic kidney disease, atrial fibrillation, diabetes mellitus,status post kidney transplant in 1983 and 2005, a week fistula graft placement, status post previous bowel surgery. 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 10/06/2015 -- x-ray of the left tibia and fibula shows no evidence of osteomyelitis. The patient has also had Doppler studies of his extremity and is awaiting the appointment with the vascular surgeon. We have not yet received these reports. 10/13/2015 -- lower extremity venous duplex reflux evaluation shows reflux in the left common femoral vein, left saphenofemoral junction and the proximal greater saphenous vein extending to the proximal calf. There is also reflux in the left proximal to mid small saphenous vein. Arterial duplex studies done showed the resting ABI was not applicable due to tibial artery medial calcification. 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 The right TBI was 1 which is normal and the left  ABI was 1 which is normal. Patient has otherwise been doing fine and has been compliant with his dressings. 10/20/2015 -- He was seen by [name] recently for a vascular opinion on 10/15/2015. His  left lower extremity venous insufficiency duplex study revealed GSV reflux,SS vein reflux and deep venous reflux in the common femoral vein. His ABIs were non compressible and his TBI on the right was 1.01 and on the left was 0.80. He was asked to continue with the wound care with compressive therapy followed by EVLA of the left GS vein 3 months. He recommended 20-30 mm thigh- high compression stockings and the need for a three-month trial of this. The patient had an Unna boot applied at the vascular office but he could not tolerate this with a lot of pain and issues with his toes and hence came here on Friday for removal of this and we reapplied a 2 layer compression. 11/10/2015 -- patient still has not purchased his 20-30 mm thigh-high compression stockings as prescribed by [name]. Readmission: 08/08/18 on evaluation today patient presents for readmission concerning a new injury to the left anterior lower extremity. He was previously seen in 2017 here in our clinic. He states that he has done fairly well since that point. Nonetheless he is having at this time some pain but states that he hit this on a table that fell over and actually struck his leg. This appears to have pulled back some of his skin which folded in on itself and is causing some difficulty as far as that is concerned. There does not appear to be any evidence of infection at this time. No fevers, chills, nausea, or vomiting noted at this time. He's been using dressings on his own currently without complication. 08/15/18 on evaluation today patient actually appears to be doing somewhat better in regard to his wound of the lower [name] when compared to the first visit last week. I had to do a much more extensive debridement at that  time it does appear that I'[name] gonna have to perform some debridement today but it does not look to be as extensive by any means. Nonetheless fortunately he does not show any signs of infection he does have discomfort at this site. I believe based on what I'[name] seeing currently he may benefit from Iodoflex to help keep the wound bed clean. Patient tolerated therapy without complication. Upon evaluation today the patient actually appears to be doing excellent in regard to his left lower extremity ulcer. This is much better than the previous two visits where he had a lot of necrotic tissue around the edge of the wound simply due to the fact that again there was a significant skin tear where the edge had been cleared away prior to reattaching and being able to heal appropriately. He seems to be doing much better at this point. 08/28/18 on evaluation today the patient's wound actually does appear to be showing signs of improvement. With that being said though he is improving he would likely note even greater improvement if we were able to sharply debride the wound. Nonetheless this caused him to much discomfort he tells me. 09/04/18 on evaluation today patient actually appears to be showing signs of improvement in regard to his left lower extremity ulcer. He has been tolerating the dressing changes including the wrap although he tells me at this point that the burning does last for a couple of days even with just the Iodoflex. I was afraid that this may been part of the issue that he was having with discomfort. It does seem to be the case. Nonetheless he shows no signs of evidence of infection at this time which is good news. No fevers  chills noted ADMISSION to [name] 10/05/2018 This is a patient who was cared for in 2017 and again in the fall of this year at our sister clinic and [place]. He actually lives in [place] in [place]. We have been dealing with an apparent traumatic area  on the left anterior tibial area. This is been present for the last several months. He was supposed to be using Iodoflex Kerlix and Coban however he was hospitalized from 09/05/2018 through 09/11/2018 with delirium secondary to pneumonia. Since then he is only been putting Vaseline gauze on this without compression. He also has a more recent skin tear on the dorsal right hand that may have only happened in the last week. The patient had arterial studies done in 2017 in January which was 3 years ago. At that point he had noncompressible ABIs but really quite good TBI's both normal. Triphasic waveforms on the right monophasic at the left posterior tibial but triphasic at the left dorsalis pedis. His ABIs in our clinic today were both noncompressible 1/23; the patient has wounds on the right dorsal hand just distal to the wrist and on the left anterior lower extremity. Both of these look very healthy he is using Hydrofera Blue 1/30; left anterior lower extremity wound much smaller. Healthy looking surface. The laceration area just distal to the wrist on the dorsal hand on the right is also just about closed I used Hydrofera Blue here 2/6; left anterior lower extremity wound is much smaller but still open. The laceration area just distal to the wrist on the dorsal hand is fully epithelialized. 2/13; the patient's anterior lower extremity wound is closed. The laceration just distal to the wrist on the dorsal hand is also fully epithelialized and closed. The patient has external compression stockings which I think are 20/30 READMISSION 08/06/2019 Mr. Malerba is a 74 year old man with had several times previously in our clinic. He is a diabetic with a history of chronic renal insufficiency status post kidney transplant in 1983 and again in 2005. He was then in 2017 with a laceration on the left lower extremity. He was worked up at the time with arterial studies and reflux studies. The arterial studies  showed ABIs to be noncompressible but TBI's were within normal limits. I do not have the reflux studies at the moment. He was also sent here in 2019 with a left lower extremity wound and then again in 2020 with left lower extremity trauma a skin tear on the wrist. He was discharged with 20/30 stockings identified from myself that that might not be enough compression. Nevertheless he states he was wearing these fairly reliably. In September he had a fall with a substantial bruise in the area of the wound. He says he saw orthopedics and they told him there was some muscle strain sometime it later this opened into a wound. He has a fairly substantial wound on the right posterior calf. Satellite areas around this including medially and posteriorly. He has not worn his stockings since the injury Past medical history; includes chronic renal failure secondary to diabetes with kidney transplant x2, atrial fibrillation, heart failure with preserved ejection fraction, coronary artery disease. ABIs on the right in our clinic were once again noncompressible 08/13/2019 on evaluation today patient appears to be doing decently well with regard to his wound compared to last week's evaluation. Unfortunately he is still having a lot of discomfort at this point which is I think in some part due to the  3 layer compression wrap which is a little bit stronger I think for him. When he was here before we actually utilized a Kerlix and Coban wrap which he states seemed to got a little bit better. Nonetheless I think we can probably drop back to this in light of the discomfort that he had. Nonetheless the pain was not really right around the wound itself as much as it was around the ankle in particular. The Iodoflex does seem to have done well for him As the wound is appearing somewhat better today which is excellent news. 11/30; still complaining of a lot of pain. Apparently arterial studies I ordered 2 weeks ago are below. I  do not believe we have an appointment with vein and vascular as of yet; ABI Findings: +---------+------------------+-----+----------+--------+ Right Rt Pressure (mmHg)IndexWaveform Comment  +---------+------------------+-----+----------+--------+ PTA >254 1.50 monophasic  +---------+------------------+-----+----------+--------+ DP >254 1.50 monophasic  +---------+------------------+-----+----------+--------+ Great T [id number] 0.40 Abnormal   +---------+------------------+-----+----------+--------+ +---------+------------------+-----+----------+-------+ Left Lt Pressure (mmHg)IndexWaveform Comment +---------+------------------+-----+----------+-------+ Brachial 169     +---------+------------------+-----+----------+-------+ PTA >254 1.50 monophasic  +---------+------------------+-----+----------+-------+ DP >254 1.50 monophasic  +---------+------------------+-----+----------+-------+ Great T oe40 0.24 Abnormal   +---------+------------------+-----+----------+-------+ Pedal arteries appear hyperemic. Patient refused Brachial pressure in the right arm. Summary: Right: Resting right ankle-brachial index indicates noncompressible right lower extremity arteries. The right toe-brachial index is abnormal. Left: Resting left ankle-brachial index indicates noncompressible left lower extremity arteries. The left toe-brachial index is abnormal. He is also having considerably more swelling in his left calf. This was not there when I last saw him 2 weeks ago. He tells me that some of the home health compression wraps have been slipping down and that may be the issue here however a month I am uncertain 08/6; sees vascular on December 22. Still complaining of a lot of pain. DVT study I did last time was negative for DVT 12/14; still complaining of pain which if this is arterial is certainly claudication and rest keeps him uncomfortable at night. He has  an appointment with vein and vascular on December 22. Wound surface is better using Iodoflex. Once the surface of this is satisfactory and we have exhausted the vascular route. Perhaps an advanced treatment option. He has a configuration of the venous ulceration although his arterial studies are not very good. The other issue is the patient has a transplanted kidney. This will make angiography difficult and challenging issue 12/21; still complaining of pain and drainage. We are using Iodoflex on the wound under compression. He sees Dr. Donnetta Hutching tomorrow to evaluate his noninvasive studies noted above. He has a transplanted kidney further complicating the options for angiography. 12/28; still using Iodoflex under compression. I have Dr. Luther Parody note from 12/22. he noted arterial studies revealing monophasic waveforms at the pedal vessels bilaterally and calcified vessels making the ABI unreliable. He did not comment on the reduced TBI's. He felt these were venous wounds based on the palpable dorsalis pedis pulse. 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 And they arranged for formal venous duplex with reflux studies in the next several weeks. Follow-up with either Scot Dock or Dr. Oneida Alar 09/24/2019 still using Iodoflex under compression. He has an appointment with Dr. Doren Custard on 1/7 with regards to his venous disease. The patient was not felt to have a primary arterial problem for the nonhealing of his wound. We did attempt to wrap him with 3 layer when he first came into the clinic he complained of a lot of pain in the ankle although this may have been because  the dressing fell down somewhat. He has far too edema fluid in the right leg for a good prognosis about healing this wound He comes in today with an excoriation on the bottom part of his right fifth toe. He thinks he may have done this taking off his clothes and that something got caught on the toe. There is no open wound however the toe itself is  very painful 1/11; we are using Iodoflex under compression. The wound bed is clean. He went on to see Dr. Doren Custard on 09/27/2019 he again feels that the patient's arterial supply is adequate. He feels that he might benefit from right greater saphenous vein ablation for a venous ulcer. In the meantime the area that he was complaining about last week on the right fifth toe. An x-ray that I ordered showed marked soft tissue swelling along the distal aspect of the right foot but there was no evidence of osteomyelitis. He comes in today with the fifth toenail just about coming off. He has black eschar underneath the toe on the plantar aspect. The toe was swollen red and very painful. In the right setting this could be a significant soft tissue infection versus ischemia of the toe itself. He did not show this to Dr. Doren Custard 1/18; I am using Iodoflex under compression a large wound on the right posterior calf. He had his right greater saphenous vein ablation by Dr. Doren Custard although I am not sure that is the only problem here. The right fifth toe which was possibly trauma 2 weeks ago continues to be exceptionally painful with a necrotic tip. Maybe not quite as swollen. I started him empirically on Augmentin 5/125 1 p.o. twice daily last week after discussing this with Dr. Loletha Grayer of nephrology. Perhaps somewhat better this week but not as good as I was hoping. A plain x-ray was negative. He comes in today with an area on the medial right calf that was blistered and now open. In looking at things he appears to be systemically fluid volume overloaded. He has a transplanted kidney. He states he takes his Lasix variably when he has appointments he tries not to take it the later I am really not certain if he takes this reliably. However he has far too much edema fluid in the right leg to easily heal this wound and he appears to be developing blisters medially to form additional wounds 1/25; his CO2 angiogram was done by Dr.  Doren Custard last week. This showed the proximal arteries all to be patent. On the right side the common femoral deep femoral superficial femoral popliteal anterior tibial and peroneal arteries were patent. The posterior tibial was occluded but reconstituted distally via collaterals from the peroneal artery he was felt he should have enough blood flow to heal his wounds including the right fifth toe. The right fifth toe looks better however he is still complaining of a lot of pain. The large area which is a venous ulceration posteriorly has a better looking surface I think we can switch to Hydrofera Blue today 2/1; the patient's original wounds on the right posterior medial calf has come down in width however superior to this he has new denuded areas and I am concerned we simply do not have enough edema control. He has already undergone a right greater saphenous ablation. He had a CO2 angiogram done by Dr. Doren Custard and the comment was that we should have enough blood flow to heal the venous wound however we simply do not seem to  have enough edema control with 3 layer compression. The patient is status post kidney transplant although his exact renal function is not really clear. Nor am i sure what dose of diuretic he is supposed to be on. He also has the area on the right fifth toe which was unclear etiology but I think became secondarily infected I gave him 2 weeks of antibiotics for this and this seems to have settled down he still has a black eschar over the tip of the toe. X-ray was negative for fracture. He says he has a history of gout. 2/8; the patient's wound on the right posterior calf is about the same. The superficial area medially also about the same. He got a prescription for prednisoneo Gout after he developed erythema on the dorsal aspect of his left great toe going along with the right fifth toe which has been problematic all along. He has not taken it because he is concerned about increasing  CBGs. He has a transplanted kidney is already on prednisone 5 mg. He would not be a good candidate for NSAIDs. Perhaps colchicine. He is not aware of what his uric acid level is 2/15; his right posterior calf wound seems to be coming in in terms of width. Everything here looks fairly good. No mechanical debridement we have been using Hydrofera Blue. He has had an ablation by vein and vascular. Felt to have adequate arterial supply to this area. The patient got prednisone last week from Dr. Angelique Holm of nephrology. At my urging he actually took it. The area on his left toe was a lot better. The right toe was not as painful but still erythematous with a wound at the tip. We have been using silver alginate here 2/22; right posterior calf seems to be gradually epithelializing. Still a fairly substantial wound. He still has an area on the tip of his right fifth toe which I think was gout related. This is gradually improving. We have been using Unna boots to wrap X-ray I did of the foot last time was again negative there was soft tissue irregularity about the distal fifth digit but no radiographic evidence of bone damage 3/1; right posterior calf seems to be gradually improving however there were areas of hyper granulation. We have been using Hydrofera Blue. The hyper granulation is mostly evident in the most superior finger shape projection of the wound itself. The area on the right fifth toe also was slough covered and required debridement. 3/8 continued improvement in the right posterior calf and the tip of the right fifth toe. We have been using Hydrofera Blue under compression he is changing the area to the toe 3/22; continued improvement in the right posterior calf. Unfortunately comes in today with a new skin tear in the mid anterior tibia area this probably had something to do with changing his dressing home health I called and left this a message last week. 3/29; continued improvement in the a  substantial wound on the right posterior calf. The skin tear anteriorly from last week is already healed on the tip of the right fifth toe there is still a nonviable area. 4/5; we have continued contraction of the substantial wound on the right posterior calf. We continue to have problems with the tip of the right fifth toe. We have been using Hydrofera Blue to both wound areas He is complaining about some discomfort in the Achilles area. T me he had surgery for what sounds like a torn Achilles about 10 years ago.  ells 4/12; we have continued contraction of the substantial wound on the right posterior calf. Still an area on the tip of the right fifth toe. I changed him to silver collagen on the toe last week but I think home health continue to use Hydrofera Blue to both wound areas. 4/19; we continue to have contraction of the substantial wound on the right posterior calf however there continues to be hyper granulation. The area on the tip of the fifth toe is very small still some debris on the surface we have been using silver collagen 4/26; both wounds have contracted. I was hoping the fifth toe wound would close over but with probing there is still an open small hole here. 5/3; his wound on the right posterior calf which was his major area continues to contract looks healthy we have been using Hydrofera Blue. Using silver collagen to the fifth toe, not making a lot of progress here 5/10; right posterior calf wound continues to be smaller in terms of surface area and look healthy we have been using Hydrofera Blue here. The tip of the right fifth toe is still open requiring debridement. He has a new laceration on the right forearm he says he hit this on a microwave 6 days ago 02/11/20-The right fifth toe wound is closed, the right posterior calf wound looks healthy with smaller dimensions compared with last time, the right forearm area of laceration has a small blister which I try to open up with  scalpel with very little fluid 6/7 the right fifth toe looks fine. Area on the right forearm is also closed. Right posterior calf wound continues to contract. Much smaller area remaining with healthy surface. We are using Hydrofera Blue under compression 6/21; area on the right posterior calf. There is only 1 small area in the most distal part of this. We have been using Hydrofera Blue under compression this is contracting surface looks healthy 6/28; right posterior calf this is healed. This was initially a hematoma. During the stay in this clinic he had a right greater saphenous vein ablation by Dr. Doren Custard of vein and vascular. He also had a difficult time with his right fifth toeo Infection versus gout. Very difficult time with this so that this has been closed for about 6 weeks. ------------------- READMISSION 04/29/20-Patient returns to the clinic this time with a right index finger swelling and pain been drainage, patient has been to the hand surgeon and vascular surgeon and ended up having a small IandD attempted in the office with expression of some gritty whitish material, patient also hit his hand and developed a small hematoma around this area. He also had vascular Dopplers done that show monophasic right radial right ulnar waveforms. He has follow-up appointments with vascular surgery as well as the hand surgeon. His appointment today with a hand surgeon he was told that he may lose the tip of his index finger if surgery is done to open up the entire area. He is reluctant to have any surgery and would like to try conservative measures He has tried epsom salt soaks with improvement then worsening , he has just finished a course of antibiotics, he does not remember the name 8/16; patient was readmitted to the clinic last week and I am seeing this for the first time. Previously we had looked at this man for a large wound in the right posterior calf that was initially a hematoma. He went on to  have a greater saphenous vein ablation. During  the stay here he also had a difficult time with an area over his right fifth toe question infectious question gout at that time. This eventually closed over although I do not think the differential was ever really totally clear in my mind. The patient is here this time with a necrotic area on the palmar surface of his right index finger. He states this started in July with a small red area. He spent some time going to see Dr. Jarome Matin of dermatology. Ended up with Dr. 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 He is also seen hand surgery which is fortunate is he is likely to require either debridement or amputation of this finger. He had a course of antibiotics Keflex and doxycycline I believe but he is finished these. Currently using Betadine. He describes this is very painful. I have not had a chance to look back over notes in care everywhere. I would like to see as hand surgery notes, vascular surgery notes and dermatology notes if this is possible. He also apparently had an x-ray ordered by Dr. Amedeo Plenty that I would like to review. 7/32; certainly no improvement in his right index finger palmar aspect. Culture I did last time showed abundant Enterobacter and abundant Proteus. I have him on cefdinir 300 mg every 24 as of Friday in response to this. This was a deep tissue culture. 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 He still complaining of a lot of pain. READMISSION 07/04/2020 Mr. Axtman is a 75 year old man with wounds on his bilateral lower extremities and bilateral upper extremities which were apparently the consequence of being found down at home in early September. He required admission to the hospital from 9/6 through 9/17 with delirium, hyponatremia, sepsis and type 2 diabetes. He gradually improved. He was sent to a nursing home in  [place] and recently has been discharged home. They are using some foam-based dressings on his bilateral lower and upper extremity wounds as well as his left fifth toe which apparently are felt to be secondary from the prolonged friction and pressure of being recumbent. We have had Mr. Charrette in the clinic on 2 different occasions. Predominantly the first time for a large wound on the right posterior calf related to trauma and chronic venous insufficiency. He was here in August with a necrotic wound on the tip of his right index finger which ultimately required amputation this is still currently wrapped. 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 Vanita Panda of nephrology He has not been previously felt to have an arterial issue previous ABIs were noncompressible 10/22; readmitted to the clinic last week. His wounds on the upper extremities are healed as well as the right lower extremity. He still has an area on the left lateral calf and the lateral part of his fifth toe. We have been using silver alginate. He looks like he has had a lot of edema improvement with the recent addition to his diuretics by Dr. Jonn Shingles of nephrology 10/29;. He has 2 wounds on the left lateral calf and the dorsal aspect the left 5th toe nonviable surface. We are using silver alginate 11/5; he did not tolerate Iodoflex. Home health use silver alginate on both the  left lateral calf and the left fifth toe. Electronic Signature(s) Signed: 07/28/2020 1:46:42 PM By: [name] MD Entered By: [name] on 07/25/2020 13:43:56 -------------------------------------------------------------------------------- Physical Exam Details Patient Name: Date of  Service: [name], KENG HA [name] 07/25/2020 1:15 PM Medical Record Number: [id number] Patient Account Number: [id number] Date of Birth/Sex: Treating RN: 01-Nov-1945 (74 y.o. [name] Primary Care Provider: [name] Other Clinician: Referring Provider: Treating Provider/Extender: [name] in Treatment: 3 Constitutional Patient is hypertensive.. Pulse regular and within target range for patient.Marland Kitchen Respirations regular, non-labored and within target range.. Temperature is normal and within the target range for the patient.Marland Kitchen Appears in no distress. Notes Wound exam; no improvement from last week and the toe was. Still a very adherent surface. I removed with a #3 curette underneath this there is nonviable subcutaneous tissue. I am able to get down to something that looks a bit healthy but it is dry. The area on the left lateral leg look better. Electronic Signature(s) Signed: 07/28/2020 1:46:42 PM By: [name] MD Entered By: [name] on 07/25/2020 13:44:41 -------------------------------------------------------------------------------- Physician Orders Details Patient Name: Date of Service: GARLEN, REINIG HA [name] 07/25/2020 1:15 PM Medical Record Number: [id number] Patient Account Number: [id number] Date of Birth/Sex: Treating RN: April 09, 1946 (74 y.o. [name] Primary Care Provider: [name] Other Clinician: Referring Provider: Treating Provider/Extender: [name] in Treatment: 3 Verbal / Phone Orders: No Diagnosis Coding ICD-10 Coding Code Description [phone] Non-pressure chronic ulcer of left calf with fat layer exposed 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 of skin L97.211 Non-pressure chronic ulcer of right calf limited to breakdown of skin S40.812D Abrasion of left upper arm, subsequent encounter S40.811D Abrasion of right  upper arm, subsequent encounter E11.622 Type 2 diabetes mellitus with other skin ulcer I87.323 Chronic venous hypertension (idiopathic) with inflammation of bilateral lower extremity Follow-up Appointments Return Appointment in 1 week. Dressing Change Frequency Wound #12 Left,Lateral Lower Leg Other: - 2 times per week (once at wound center, once at wound center) all wounds Wound #13 Left T Fifth oe Other: - 2 times per week (once at wound center, once at wound center) all wounds Skin Barriers/Peri-Wound Care Moisturizing lotion - to left leg with dressing changes and right leg daily Wound Cleansing May shower with protection. May shower and wash wound with soap and water. - on days when dressings are changed Primary Wound Dressing Wound #12 Left,Lateral Lower Leg Hydrofera Blue - classic moistened with saline Wound #13 Left T Fifth oe Hydrofera Blue - classic moistened with saline Secondary Dressing Wound #12 Left,Lateral Lower Leg Dry Gauze Wound #13 Left T Fifth oe Kerlix/Rolled Gauze Dry Gauze Edema Control 3 Layer Compression System - Left Lower Extremity Avoid standing for long periods of time Elevate legs to the level of the heart or above for 30 minutes daily and/or when sitting, a frequency of: - whenever sitting throughout the day Exercise regularly Support Garment 20-30 mm/Hg pressure to: - compression stocking to right leg daily, apply in first thing in the morning and remove at night [facility] for wound care. - [facility] Patient Medications llergies: Elavil A Notifications Medication Indication Start End prior to debridement 07/25/2020 benzocaine DOSE topical 20 % aerosol - aerosol topical Electronic Signature(s) Signed: 07/25/2020 6:06:29 PM By: [name] RN, BSN Signed: 07/28/2020 1:46:42 PM By: [name] MD Entered By: [name] on 07/25/2020  13:41:04 -------------------------------------------------------------------------------- Problem List Details Patient Name: Date of Service: IDREES, QUAM 07/25/2020 1:15 PM Medical Record Number: [id number] Patient Account Number: [id number] Date of Birth/Sex: Treating RN: Jul 29, 1946 (74 y.o. [name] Primary Care Provider: [name] Other Clinician: Referring Provider: Treating Provider/Extender: [name] in Treatment: 3 Active Problems ICD-10 Encounter Code Description Active Date MDM Diagnosis [phone] Non-pressure chronic ulcer of left calf with fat layer exposed 07/04/2020 No Yes 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 of 07/04/2020 No Yes skin E11.622 Type 2 diabetes mellitus with other skin ulcer 07/04/2020 No Yes I87.323 Chronic venous hypertension (idiopathic) with inflammation of bilateral lower 07/04/2020 No Yes extremity Inactive Problems ICD-10 Code Description Active Date Inactive Date L97.211 Non-pressure chronic ulcer of right calf limited to breakdown of skin 07/04/2020 07/04/2020 S40.812D Abrasion of left upper arm, subsequent encounter 07/04/2020 07/04/2020 S40.811D Abrasion of right upper arm, subsequent encounter 07/04/2020 07/04/2020 Resolved Problems Electronic Signature(s) Signed: 07/28/2020 1:46:42 PM By: [name] MD Entered By: [name] on 07/25/2020 13:42:42 -------------------------------------------------------------------------------- Progress Note Details Patient Name: Date of Service: SASUKE, YAFFE HA [name] 07/25/2020 1:15 PM Medical Record Number: [id number] Patient Account Number: [id number] Date of Birth/Sex: Treating RN: 1946/02/08 (74 y.o. [name] Primary Care Provider: [name] Other Clinician: Referring Provider: Treating Provider/Extender: [name] in Treatment:  3 Subjective History of Present Illness (HPI) [place] HPI Description: 74 year old gentleman who was recently seen by his nephrologist [name], and noted to have a wound on his left lower extremity which was lacerated 2 months ago and now has reopened. The patient's left shin has a ulceration with some exudate but no evidence of infection and he was referred to [place] for further care as it was known that the patient has had some peripheral vascular disease in the past. Past medical history significant for chronic kidney disease, atrial fibrillation, diabetes mellitus,status post kidney transplant in 1983 and 2005, a week fistula graft placement, status post previous bowel surgery. 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 10/06/2015 -- x-ray of the left tibia and fibula shows no evidence of osteomyelitis. The patient has also had Doppler studies of his extremity and is awaiting the appointment with the vascular surgeon. We have not yet received these reports. 10/13/2015 -- lower extremity venous duplex reflux evaluation shows reflux in the left common femoral vein, left saphenofemoral junction and the proximal greater saphenous vein extending to the proximal calf. There is also reflux in the left proximal to mid small saphenous vein. Arterial duplex studies done showed the resting ABI was not applicable due to tibial artery medial calcification. 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 The right TBI was 1 which is normal and the left ABI was 1 which is normal. Patient has otherwise been doing fine and has been compliant with his dressings. 10/20/2015 -- He was seen by [name] recently for a vascular opinion on 10/15/2015. His left lower extremity venous insufficiency duplex study revealed GSV reflux,SS vein reflux and  deep venous reflux in the common femoral vein. His ABIs were non compressible and his TBI on the right was 1.01 and on the left was 0.80. He was asked to continue with the wound care with compressive therapy  followed by [facility] of the left GS vein 3 months. He recommended 20-30 mm thigh- high compression stockings and the need for a three-month trial of this. The patient had an Unna boot applied at the vascular office but he could not tolerate this with a lot of pain and issues with his toes and hence came here on Friday for removal of this and we reapplied a 2 layer compression. 11/10/2015 -- patient still has not purchased his 20-30 mm thigh-high compression stockings as prescribed by [name]. Readmission: 08/08/18 on evaluation today patient presents for readmission concerning a new injury to the left anterior lower extremity. He was previously seen in 2017 here in our clinic. He states that he has done fairly well since that point. Nonetheless he is having at this time some pain but states that he hit this on a table that fell over and actually struck his leg. This appears to have pulled back some of his skin which folded in on itself and is causing some difficulty as far as that is concerned. There does not appear to be any evidence of infection at this time. No fevers, chills, nausea, or vomiting noted at this time. He's been using dressings on his own currently without complication. 08/15/18 on evaluation today patient actually appears to be doing somewhat better in regard to his wound of the lower [name] when compared to the first visit last week. I had to do a much more extensive debridement at that time it does appear that I'[name] gonna have to perform some debridement today but it does not look to be as extensive by any means. Nonetheless fortunately he does not show any signs of infection he does have discomfort at this site. I believe based on what I'[name] seeing currently he may benefit from  Iodoflex to help keep the wound bed clean. Patient tolerated therapy without complication. Upon evaluation today the patient actually appears to be doing excellent in regard to his left lower extremity ulcer. This is much better than the previous two visits where he had a lot of necrotic tissue around the edge of the wound simply due to the fact that again there was a significant skin tear where the edge had been cleared away prior to reattaching and being able to heal appropriately. He seems to be doing much better at this point. 08/28/18 on evaluation today the patient's wound actually does appear to be showing signs of improvement. With that being said though he is improving he would likely note even greater improvement if we were able to sharply debride the wound. Nonetheless this caused him to much discomfort he tells me. 09/04/18 on evaluation today patient actually appears to be showing signs of improvement in regard to his left lower extremity ulcer. He has been tolerating the dressing changes including the wrap although he tells me at this point that the burning does last for a couple of days even with just the Iodoflex. I was afraid that this may been part of the issue that he was having with discomfort. It does seem to be the case. Nonetheless he shows no signs of evidence of infection at this time which is good news. No fevers chills noted AD[facility]ION to [name] 10/05/2018 This is a patient who was cared for in 2017 and again in the fall of this year at our sister clinic and [facility]. He actually lives in [place] in [place]. We have been dealing with an apparent traumatic area on the  left anterior tibial area. This is been present for the last several months. He was supposed to be using Iodoflex Kerlix and Coban however he was hospitalized from 09/05/2018 through 09/11/2018 with delirium secondary to pneumonia. Since then he is only been putting Vaseline gauze on this  without compression. He also has a more recent skin tear on the dorsal right hand that may have only happened in the last week. The patient had arterial studies done in 2017 in January which was 3 years ago. At that point he had noncompressible ABIs but really quite good TBI's both normal. Triphasic waveforms on the right monophasic at the left posterior tibial but triphasic at the left dorsalis pedis. His ABIs in our clinic today were both noncompressible 1/23; the patient has wounds on the right dorsal hand just distal to the wrist and on the left anterior lower extremity. Both of these look very healthy he is using Hydrofera Blue 1/30; left anterior lower extremity wound much smaller. Healthy looking surface. The laceration area just distal to the wrist on the dorsal hand on the right is also just about closed I used Hydrofera Blue here 2/6; left anterior lower extremity wound is much smaller but still open. The laceration area just distal to the wrist on the dorsal hand is fully epithelialized. 2/13; the patient's anterior lower extremity wound is closed. The laceration just distal to the wrist on the dorsal hand is also fully epithelialized and closed. The patient has external compression stockings which I think are 20/30 READMISSION 08/06/2019 Mr. Witt is a 73 year old man with had several times previously in our clinic. He is a diabetic with a history of chronic renal insufficiency status post kidney transplant in 1983 and again in 2005. He was then in 2017 with a laceration on the left lower extremity. He was worked up at the time with arterial studies and reflux studies. The arterial studies showed ABIs to be noncompressible but TBI's were within normal limits. I do not have the reflux studies at the moment. He was also sent here in 2019 with a left lower extremity wound and then again in 2020 with left lower extremity trauma a skin tear on the wrist. He was discharged with 20/30  stockings identified from myself that that might not be enough compression. Nevertheless he states he was wearing these fairly reliably. In September he had a fall with a substantial bruise in the area of the wound. He says he saw orthopedics and they told him there was some muscle strain sometime it later this opened into a wound. He has a fairly substantial wound on the right posterior calf. Satellite areas around this including medially and posteriorly. He has not worn his stockings since the injury Past medical history; includes chronic renal failure secondary to diabetes with kidney transplant x2, atrial fibrillation, heart failure with preserved ejection fraction, coronary artery disease. ABIs on the right in our clinic were once again noncompressible 08/13/2019 on evaluation today patient appears to be doing decently well with regard to his wound compared to last week's evaluation. Unfortunately he is still having a lot of discomfort at this point which is I think in some part due to the 3 layer compression wrap which is a little bit stronger I think for him. When he was here before we actually utilized a Kerlix and Coban wrap which he states seemed to got a little bit better. Nonetheless I think we can probably drop back to this in light of the discomfort  that he had. Nonetheless the pain was not really right around the wound itself as much as it was around the ankle in particular. The Iodoflex does seem to have done well for him As the wound is appearing somewhat better today which is excellent news. 11/30; still complaining of a lot of pain. Apparently arterial studies I ordered 2 weeks ago are below. I do not believe we have an appointment with vein and vascular as of yet; ABI Findings: +---------+------------------+-----+----------+--------+ Right Rt Pressure (mmHg)IndexWaveform Comment  +---------+------------------+-----+----------+--------+ PTA >254 1.50 monophasic   +---------+------------------+-----+----------+--------+ DP >254 1.50 monophasic  +---------+------------------+-----+----------+--------+ Great T [id number] 0.40 Abnormal   +---------+------------------+-----+----------+--------+ +---------+------------------+-----+----------+-------+ Left Lt Pressure (mmHg)IndexWaveform Comment +---------+------------------+-----+----------+-------+ Brachial 169     +---------+------------------+-----+----------+-------+ PTA >254 1.50 monophasic  +---------+------------------+-----+----------+-------+ DP >254 1.50 monophasic  +---------+------------------+-----+----------+-------+ Great T oe40 0.24 Abnormal   +---------+------------------+-----+----------+-------+ Pedal arteries appear hyperemic. Patient refused Brachial pressure in the right arm. Summary: Right: Resting right ankle-brachial index indicates noncompressible right lower extremity arteries. The right toe-brachial index is abnormal. Left: Resting left ankle-brachial index indicates noncompressible left lower extremity arteries. The left toe-brachial index is abnormal. He is also having considerably more swelling in his left calf. This was not there when I last saw him 2 weeks ago. He tells me that some of the home health compression wraps have been slipping down and that may be the issue here however a month I am uncertain 73/4; sees vascular on December 22. Still complaining of a lot of pain. DVT study I did last time was negative for DVT 12/14; still complaining of pain which if this is arterial is certainly claudication and rest keeps him uncomfortable at night. He has an appointment with vein and vascular on December 22. Wound surface is better using Iodoflex. Once the surface of this is satisfactory and we have exhausted the vascular route. Perhaps an advanced treatment option. He has a configuration of the venous ulceration although his arterial  studies are not very good. The other issue is the patient has a transplanted kidney. This will make angiography difficult and challenging issue 12/21; still complaining of pain and drainage. We are using Iodoflex on the wound under compression. He sees Dr. Donnetta Hutching tomorrow to evaluate his noninvasive studies noted above. He has a transplanted kidney further complicating the options for angiography. 12/28; still using Iodoflex under compression. I have Dr. Luther Parody note from 12/22. he noted arterial studies revealing monophasic waveforms at the pedal vessels bilaterally and calcified vessels making the ABI unreliable. He did not comment on the reduced TBI's. He felt these were venous wounds based on the palpable dorsalis pedis pulse. 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 And they arranged for formal venous duplex with reflux studies in the next several weeks. Follow-up with either Scot Dock or Dr. Oneida Alar 09/24/2019 still using Iodoflex under compression. He has an appointment with Dr. Doren Custard on 1/7 with regards to his venous disease. The patient was not felt to have a primary arterial problem for the nonhealing of his wound. We did attempt to wrap him with 3 layer when he first came into the clinic he complained of a lot of pain in the ankle although this may have been because the dressing fell down somewhat. He has far too edema fluid in the right leg for a good prognosis about healing this wound He comes in today with an excoriation on the bottom part of his right fifth toe. He thinks he may have done this taking off his clothes and that  something got caught on the toe. There is no open wound however the toe itself is very painful 1/11; we are using Iodoflex under compression. The wound bed is clean. He went on to see Dr. Doren Custard on 09/27/2019 he again feels that the patient's arterial supply is adequate. He feels that he might benefit from right greater saphenous vein ablation for a venous ulcer. In  the meantime the area that he was complaining about last week on the right fifth toe. An x-ray that I ordered showed marked soft tissue swelling along the distal aspect of the right foot but there was no evidence of osteomyelitis. He comes in today with the fifth toenail just about coming off. He has black eschar underneath the toe on the plantar aspect. The toe was swollen red and very painful. In the right setting this could be a significant soft tissue infection versus ischemia of the toe itself. He did not show this to Dr. Doren Custard 1/18; I am using Iodoflex under compression a large wound on the right posterior calf. He had his right greater saphenous vein ablation by Dr. Doren Custard although I am not sure that is the only problem here. ooThe right fifth toe which was possibly trauma 2 weeks ago continues to be exceptionally painful with a necrotic tip. Maybe not quite as swollen. I started him empirically on Augmentin 5/125 1 p.o. twice daily last week after discussing this with Dr. Loletha Grayer of nephrology. Perhaps somewhat better this week but not as good as I was hoping. A plain x-ray was negative. He comes in today with an area on the medial right calf that was blistered and now open. In looking at things he appears to be systemically fluid volume overloaded. He has a transplanted kidney. He states he takes his Lasix variably when he has appointments he tries not to take it the later I am really not certain if he takes this reliably. However he has far too much edema fluid in the right leg to easily heal this wound and he appears to be developing blisters medially to form additional wounds 1/25; his CO2 angiogram was done by Dr. Doren Custard last week. This showed the proximal arteries all to be patent. On the right side the common femoral deep femoral superficial femoral popliteal anterior tibial and peroneal arteries were patent. The posterior tibial was occluded but reconstituted distally via collaterals from the  peroneal artery he was felt he should have enough blood flow to heal his wounds including the right fifth toe. The right fifth toe looks better however he is still complaining of a lot of pain. The large area which is a venous ulceration posteriorly has a better looking surface I think we can switch to Hydrofera Blue today 2/1; the patient's original wounds on the right posterior medial calf has come down in width however superior to this he has new denuded areas and I am concerned we simply do not have enough edema control. He has already undergone a right greater saphenous ablation. He had a CO2 angiogram done by Dr. Doren Custard and the comment was that we should have enough blood flow to heal the venous wound however we simply do not seem to have enough edema control with 3 layer compression. The patient is status post kidney transplant although his exact renal function is not really clear. Nor am i sure what dose of diuretic he is supposed to be on. He also has the area on the right fifth toe which was unclear etiology  but I think became secondarily infected I gave him 2 weeks of antibiotics for this and this seems to have settled down he still has a black eschar over the tip of the toe. X-ray was negative for fracture. He says he has a history of gout. 2/8; the patient's wound on the right posterior calf is about the same. The superficial area medially also about the same. He got a prescription for prednisoneo Gout after he developed erythema on the dorsal aspect of his left great toe going along with the right fifth toe which has been problematic all along. He has not taken it because he is concerned about increasing CBGs. He has a transplanted kidney is already on prednisone 5 mg. He would not be a good candidate for NSAIDs. Perhaps colchicine. He is not aware of what his uric acid level is 2/15; his right posterior calf wound seems to be coming in in terms of width. Everything here looks fairly good.  No mechanical debridement we have been using Hydrofera Blue. He has had an ablation by vein and vascular. Felt to have adequate arterial supply to this area. The patient got prednisone last week from Dr. Angelique Holm of nephrology. At my urging he actually took it. The area on his left toe was a lot better. The right toe was not as painful but still erythematous with a wound at the tip. We have been using silver alginate here 2/22; right posterior calf seems to be gradually epithelializing. Still a fairly substantial wound. He still has an area on the tip of his right fifth toe which I think was gout related. This is gradually improving. We have been using Unna boots to wrap X-ray I did of the foot last time was again negative there was soft tissue irregularity about the distal fifth digit but no radiographic evidence of bone damage 3/1; right posterior calf seems to be gradually improving however there were areas of hyper granulation. We have been using Hydrofera Blue. The hyper granulation is mostly evident in the most superior finger shape projection of the wound itself. ooThe area on the right fifth toe also was slough covered and required debridement. 3/8 continued improvement in the right posterior calf and the tip of the right fifth toe. We have been using Hydrofera Blue under compression he is changing the area to the toe 3/22; continued improvement in the right posterior calf. Unfortunately comes in today with a new skin tear in the mid anterior tibia area this probably had something to do with changing his dressing home health I called and left this a message last week. 3/29; continued improvement in the a substantial wound on the right posterior calf. The skin tear anteriorly from last week is already healed on the tip of the right fifth toe there is still a nonviable area. 4/5; we have continued contraction of the substantial wound on the right posterior calf. We continue to have problems  with the tip of the right fifth toe. We have been using Hydrofera Blue to both wound areas He is complaining about some discomfort in the Achilles area. T me he had surgery for what sounds like a torn Achilles about 10 years ago. ells 4/12; we have continued contraction of the substantial wound on the right posterior calf. ooStill an area on the tip of the right fifth toe. I changed him to silver collagen on the toe last week but I think home health continue to use Hydrofera Blue to both wound areas. 4/19;  we continue to have contraction of the substantial wound on the right posterior calf however there continues to be hyper granulation. ooThe area on the tip of the fifth toe is very small still some debris on the surface we have been using silver collagen 4/26; both wounds have contracted. I was hoping the fifth toe wound would close over but with probing there is still an open small hole here. 5/3; his wound on the right posterior calf which was his major area continues to contract looks healthy we have been using Hydrofera Blue. Using silver collagen to the fifth toe, not making a lot of progress here 5/10; right posterior calf wound continues to be smaller in terms of surface area and look healthy we have been using Hydrofera Blue here. The tip of the right fifth toe is still open requiring debridement. He has a new laceration on the right forearm he says he hit this on a microwave 6 days ago 02/11/20-The right fifth toe wound is closed, the right posterior calf wound looks healthy with smaller dimensions compared with last time, the right forearm area of laceration has a small blister which I try to open up with scalpel with very little fluid 6/7 the right fifth toe looks fine. Area on the right forearm is also closed. Right posterior calf wound continues to contract. Much smaller area remaining with healthy surface. We are using Hydrofera Blue under compression 6/21; area on the right  posterior calf. There is only 1 small area in the most distal part of this. We have been using Hydrofera Blue under compression this is contracting surface looks healthy 6/28; right posterior calf this is healed. This was initially a hematoma. During the stay in this clinic he had a right greater saphenous vein ablation by Dr. Doren Custard of vein and vascular. He also had a difficult time with his right fifth toeo Infection versus gout. Very difficult time with this so that this has been closed for about 6 weeks. ------------------- READMISSION 04/29/20-Patient returns to the clinic this time with a right index finger swelling and pain been drainage, patient has been to the hand surgeon and vascular surgeon and ended up having a small IandD attempted in the office with expression of some gritty whitish material, patient also hit his hand and developed a small hematoma around this area. He also had vascular Dopplers done that show monophasic right radial right ulnar waveforms. He has follow-up appointments with vascular surgery as well as the hand surgeon. His appointment today with a hand surgeon he was told that he may lose the tip of his index finger if surgery is done to open up the entire area. He is reluctant to have any surgery and would like to try conservative measures He has tried epsom salt soaks with improvement then worsening , he has just finished a course of antibiotics, he does not remember the name 8/16; patient was readmitted to the clinic last week and I am seeing this for the first time. Previously we had looked at this man for a large wound in the right posterior calf that was initially a hematoma. He went on to have a greater saphenous vein ablation. During the stay here he also had a difficult time with an area over his right fifth toe question infectious question gout at that time. This eventually closed over although I do not think the differential was ever really totally clear in  my mind. The patient is here this time with a necrotic  area on the palmar surface of his right index finger. He states this started in July with a small red area. He spent some time going to see Dr. Jarome Matin of dermatology. Ended up with Dr. 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 He is also seen hand surgery which is fortunate is he is likely to require either debridement or amputation of this finger. He had a course of antibiotics Keflex and doxycycline I believe but he is finished these. Currently using Betadine. He describes this is very painful. I have not had a chance to look back over notes in care everywhere. I would like to see as hand surgery notes, vascular surgery notes and dermatology notes if this is possible. He also apparently had an x-ray ordered by Dr. Amedeo Plenty that I would like to review. 8/46; certainly no improvement in his right index finger palmar aspect. Culture I did last time showed abundant Enterobacter and abundant Proteus. I have him on cefdinir 300 mg every 24 as of Friday in response to this. This was a deep tissue culture. 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 He still complaining of a lot of pain. READMISSION 07/04/2020 Mr. Uecker is a 74 year old man with wounds on his bilateral lower extremities and bilateral upper extremities which were apparently the consequence of being found down at home in early September. He required admission to the hospital from 9/6 through 9/17 with delirium, hyponatremia, sepsis and type 2 diabetes. He gradually improved. He was sent to a nursing home in [place] and recently has been discharged home. They are using some foam-based dressings on his bilateral lower and upper extremity wounds as well as his left fifth toe which apparently are felt to be secondary from the prolonged friction and pressure of being recumbent. We  have had Mr. Froelich in the clinic on 2 different occasions. Predominantly the first time for a large wound on the right posterior calf related to trauma and chronic venous insufficiency. He was here in August with a necrotic wound on the tip of his right index finger which ultimately required amputation this is still currently wrapped. 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 Vanita Panda of nephrology He has not been previously felt to have an arterial issue previous ABIs were noncompressible 10/22; readmitted to the clinic last week. His wounds on the upper extremities are healed as well as the right lower extremity. He still has an area on the left lateral calf and the lateral part of his fifth toe. We have been using silver alginate. He looks like he has had a lot of edema improvement with the recent addition to his diuretics by Dr. Jonn Shingles of nephrology 10/29;. He has 2 wounds on the left lateral calf and the dorsal aspect the left 5th toe nonviable surface. We are using silver alginate 11/5; he did not tolerate Iodoflex. Home health use silver alginate on both the left lateral calf and the left fifth toe. Objective Constitutional Patient is hypertensive.. Pulse regular and within target range for patient.Marland Kitchen Respirations regular, non-labored and within target range.. Temperature is normal and within the target range for the patient.Marland Kitchen Appears in no distress. Vitals Time Taken: 1:12 PM, Height: 68 in, Weight:  192 lbs, BMI: 29.2, Temperature: 98.5 F, Pulse: 80 bpm, Respiratory Rate: 20 breaths/min, Blood Pressure: 177/86 mmHg. General Notes: Wound exam; no improvement from last week and the toe was. Still a very adherent surface. I removed  with a #3 curette underneath this there is nonviable subcutaneous tissue. I am able to get down to something that looks a bit healthy but it is dry. ooThe area on the left lateral leg look better. Integumentary (Hair, Skin) Wound #12 status is Open. Original cause of wound was Trauma. The wound is located on the Left,Lateral Lower Leg. The wound measures 2cm length x 1cm width x 0.2cm depth; 1.571cm^2 area and 0.314cm^3 volume. There is Fat Layer (Subcutaneous Tissue) exposed. There is no tunneling or undermining noted. There is a medium amount of serosanguineous drainage noted. The wound margin is distinct with the outline attached to the wound base. There is small (1-33%) pink granulation within the wound bed. There is a large (67-100%) amount of necrotic tissue within the wound bed including Adherent Slough. Wound #13 status is Open. Original cause of wound was Gradually Appeared. The wound is located on the Left T Fifth. The wound measures 1.1cm length x oe 0.5cm width x 0.2cm depth; 0.432cm^2 area and 0.086cm^3 volume. There is no tunneling or undermining noted. There is a none present amount of drainage noted. The wound margin is distinct with the outline attached to the wound base. There is no granulation within the wound bed. There is a large (67-100%) amount of necrotic tissue within the wound bed including Adherent Slough. 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 There was a Excisional Skin/Subcutaneous Tissue  Debridement with a total area of 0.55 sq cm performed by Ricard Dillon., MD. With the following instrument(s): Curette to remove Viable and Non-Viable tissue/material. Material removed includes Subcutaneous Tissue and Slough and after achieving pain control using Other (benzocaine 20% spray). No specimens were taken. A time out was conducted at 13:30, prior to the start of the procedure. A Minimum amount of bleeding was controlled with Pressure. The procedure was tolerated well with a pain level of 7 throughout and a pain level of 4 following the procedure. Post Debridement Measurements: 1.1cm length x 0.5cm width x 0.2cm depth; 0.086cm^3 volume. Character of Wound/Ulcer Post Debridement is improved. Severity of Tissue Post Debridement is: Fat layer exposed. Post procedure Diagnosis Wound #13: Same as Pre-Procedure Wound #12 Pre-procedure diagnosis of Wound #12 is a Diabetic Wound/Ulcer of the Lower Extremity located on the Left,Lateral Lower Leg . There was a Three Layer Compression Therapy Procedure by [name], RN. Post procedure Diagnosis Wound #12: Same as Pre-Procedure Plan Follow-up Appointments: Return Appointment in 1 week. Dressing Change Frequency: Wound #12 Left,Lateral Lower Leg: Other: - 2 times per week (once at wound center, once at wound center) all wounds Wound #13 Left T Fifth: oe Other: - 2 times per week (once at wound center, once at wound center) all wounds Skin Barriers/Peri-Wound Care: Moisturizing lotion - to left leg with dressing changes and right leg daily Wound Cleansing: May shower with protection. May  shower and wash wound with soap and water. - on days when dressings are changed Primary Wound Dressing: Wound #12 Left,Lateral Lower Leg: Hydrofera Blue - classic moistened with saline Wound #13 Left T Fifth: oe Hydrofera Blue - classic moistened with saline Secondary Dressing: Wound #12 Left,Lateral Lower Leg: Dry Gauze Wound #13 Left T  Fifth: oe Kerlix/Rolled Gauze Dry Gauze Edema Control: 3 Layer Compression System - Left Lower Extremity Avoid standing for long periods of time Elevate legs to the level of the heart or above for 30 minutes daily and/or when sitting, a frequency of: - whenever sitting throughout the day Exercise regularly Support Garment 20-30 mm/Hg pressure to: - compression stocking to right leg daily, apply in first thing in the morning and remove at night Home Health: [facility] for wound care. - [facility] The following medication(s) was prescribed: benzocaine topical 20 % aerosol aerosol topical for prior to debridement was prescribed at facility 1. After much deliberation we decided on Hydrofera Blue predominantly to give the toes some chance at moisture and debridement. We will also use it on the left lateral calf that looked better 2. I am uncertain about the insurance coverage of Santyl and his ability to afford it which was another issue. Electronic Signature(s) Signed: 07/28/2020 1:46:42 PM By: [name] MD Entered By: [name] on 07/25/2020 13:45:23 -------------------------------------------------------------------------------- SuperBill Details Patient Name: Date of Service: ALTER, MOSS HA [name] 07/25/2020 Medical Record Number: [id number] Patient Account Number: [id number] Date of Birth/Sex: Treating RN: [birth year] (74 y.o. [name] Primary Care Provider: [name] Other Clinician: Referring Provider: Treating Provider/Extender: [name] in Treatment: 3 Diagnosis Coding ICD-10 Codes Code Description [phone] Non-pressure chronic ulcer of left calf with fat layer exposed 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 of skin L97.211 Non-pressure chronic ulcer of right calf limited to breakdown of skin S40.812D Abrasion of left upper arm, subsequent  encounter S40.811D Abrasion of right upper arm, subsequent encounter E11.622 Type 2 diabetes mellitus with other skin ulcer I87.323 Chronic venous hypertension (idiopathic) with inflammation of bilateral lower extremity Facility Procedures CPT4 Code: [id number] Description: 64403 - DEB SUBQ TISSUE 20 SQ CM/< ICD-10 Diagnosis Description 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 of skin Modifier: Quantity: 1 CPT4 Code: [id number] Description: (Facility Use Only) 29581LT - [place] LWR LT LEG Modifier: 27 Quantity: 1 Physician Procedures : CPT4 Code Description Modifier [id number] - WC PHYS SUBQ TISS 20 SQ CM ICD-10 Diagnosis Description 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 of skin Quantity: 1 Electronic Signature(s) Signed: 07/28/2020 1:46:42 PM By: [name] MD Entered By: [name] on 07/25/2020 13:45:35

## 2020-07-28 NOTE — Progress Notes (Signed)
DAIEL, STROHECKER ([id number]) Visit Report for 07/25/2020 Arrival Information Details Patient Name: Date of Service: [name], [name] 07/25/2020 1:15 PM Medical Record Number: [id number] Patient Account Number: [id number] Date of Birth/Sex: Treating RN: 27-Sep-1945 (73 y.o. [name]) Carlene Coria Primary Care Damien Cisar: [name] Other Clinician: Referring Kameran Mcneese: Treating Carlinda Ohlson/Extender: [name] in Treatment: 3 Visit Information History Since Last Visit All ordered tests and consults were completed: No Patient Arrived: Ambulatory Added or deleted any medications: No Arrival Time: 13:11 Any new allergies or adverse reactions: No Accompanied By: self Had a fall or experienced change in No Transfer Assistance: None activities of daily living that may affect Patient Identification Verified: Yes risk of falls: Secondary Verification Process Completed: Yes Signs or symptoms of abuse/neglect since last visito No Patient Requires Transmission-Based Precautions: No Hospitalized since last visit: No Patient Has Alerts: No Implantable device outside of the clinic excluding No cellular tissue based products placed in the center since last visit: Has Dressing in Place as Prescribed: No Has Compression in Place as Prescribed: No Pain Present Now: No Electronic Signature(s) Signed: 07/25/2020 5:00:24 PM By: Carlene Coria RN Entered By: Carlene Coria on 07/25/2020 13:12:04 -------------------------------------------------------------------------------- Compression Therapy Details Patient Name: Date of Service: Michael, [name] HA M 07/25/2020 1:15 PM Medical Record Number: [id number] Patient Account Number: [id number] Date of Birth/Sex: Treating RN: 02/22/1946 (74 y.o. [name] Primary Care TRUE Garciamartinez: [name] Other Clinician: Referring Jaylenne Hamelin: Treating Shadai Mcclane/Extender: [name] in  Treatment: 3 Compression Therapy Performed for Wound Assessment: Wound #12 Left,Lateral Lower Leg Performed By: Clinician [name], RN Compression Type: Three Layer Post Procedure Diagnosis Same as Pre-procedure Electronic Signature(s) Signed: 07/25/2020 6:06:29 PM By: [name] RN, BSN Entered By: [name] on 07/25/2020 13:37:20 -------------------------------------------------------------------------------- Encounter Discharge Information Details Patient Name: Date of Service: Michael, [name] HA M 07/25/2020 1:15 PM Medical Record Number: [id number] Patient Account Number: [id number] Date of Birth/Sex: Treating RN: 1946/08/04 (74 y.o. [name], Tammi Klippel Primary Care Garion Wempe: [name] Other Clinician: Referring Elayjah Chaney: Treating Vu Liebman/Extender: [name] in Treatment: 3 Encounter Discharge Information Items Post Procedure Vitals Discharge Condition: Stable Temperature (F): 98.5 Ambulatory Status: Ambulatory Pulse (bpm): 80 Discharge Destination: Home Respiratory Rate (breaths/min): 20 Transportation: Private Auto Blood Pressure (mmHg): 177/86 Accompanied By: self Schedule Follow-up Appointment: Yes Clinical Summary of Care: Electronic Signature(s) Signed: 07/25/2020 5:41:13 PM By: [name] Entered By: [name] on 07/25/2020 13:50:19 -------------------------------------------------------------------------------- Lower Extremity Assessment Details Patient Name: Date of Service: Michael, [name] 07/25/2020 1:15 PM Medical Record Number: [id number] Patient Account Number: [id number] Date of Birth/Sex: Treating RN: 1946-02-08 (73 y.o. [name]) Carlene Coria Primary Care Starlee Corralejo: [name] Other Clinician: Referring October Peery: Treating Cleaven Demario/Extender: [name] in Treatment: 3 Edema Assessment Assessed: Shirlyn Goltz: No] [Right: No] Edema: [Left: Ye] [Right:  s] Calf Left: Right: Point of Measurement: 42 cm From Medial Instep 40 cm 35 cm Ankle Left: Right: Point of Measurement: 18 cm From Medial Instep 22.5 cm 22.5 cm Electronic Signature(s) Signed: 07/25/2020 5:00:24 PM By: Carlene Coria RN Entered By: Carlene Coria on 07/25/2020 13:18:43 -------------------------------------------------------------------------------- Multi Wound Chart Details Patient Name: Date of Service: Michael, [name] HA M 07/25/2020 1:15 PM Medical Record Number: [id number] Patient Account Number: [id number] Date of Birth/Sex: Treating RN: Aug 07, 1946 (74 y.o. [name] Primary Care Nirel Babler: [name] Other Clinician: Referring Yaqub Arney: Treating Timica Marcom/Extender: [name]  in Treatment: 3 Vital Signs Height(in): 68 Pulse(bpm): 80 Weight(lbs): 192 Blood Pressure(mmHg): 177/86 Body Mass Index(BMI): 29 Temperature(F): 98.5 Respiratory Rate(breaths/min): 20 Photos: [12:No Photos Left, Lateral Lower Leg] [13:No Photos Left T Fifth oe] [N/A:N/A N/A] Wound Location: [12:Trauma] [13:Gradually Appeared] [N/A:N/A] Wounding Event: [12:Diabetic Wound/Ulcer of the Lower] [13:Diabetic Wound/Ulcer of the Lower] [N/A:N/A] Primary Etiology: [12:Extremity Cataracts, Anemia, Arrhythmia,] [13:Extremity Cataracts, Anemia, Arrhythmia,] [N/A:N/A] Comorbid History: [12:Congestive Heart Failure, Hypertension, Type II Diabetes, Gout, Hypertension, Type II Diabetes, Gout, Neuropathy 05/26/2020] [13:Congestive Heart Failure, Neuropathy 06/18/2020] [N/A:N/A] Date Acquired: [12:3] [13:3] [N/A:N/A] Weeks of Treatment: [12:Open] [13:Open] [N/A:N/A] Wound Status: [12:2x1x0.2] [13:1.1x0.5x0.2] [N/A:N/A] Measurements L x W x D (cm) [12:1.571] [13:0.432] [N/A:N/A] A (cm) : rea [12:0.314] [13:0.086] [N/A:N/A] Volume (cm) : [12:54.50%] [13:23.50%] [N/A:N/A] % Reduction in A [12:rea: 77.30%] [13:-50.90%] [N/A:N/A] % Reduction in Volume:  [12:Grade 2] [13:Grade 2] [N/A:N/A] Classification: [12:Medium] [13:None Present] [N/A:N/A] Exudate A mount: [12:Serosanguineous] [13:N/A] [N/A:N/A] Exudate Type: [12:red, brown] [13:N/A] [N/A:N/A] Exudate Color: [12:Distinct, outline attached] [13:Distinct, outline attached] [N/A:N/A] Wound Margin: [12:Small (1-33%)] [13:None Present (0%)] [N/A:N/A] Granulation A mount: [12:Pink] [13:N/A] [N/A:N/A] Granulation Quality: [12:Large (67-100%)] [13:Large (67-100%)] [N/A:N/A] Necrotic A mount: [12:Fat Layer (Subcutaneous Tissue): Yes Fascia: No] [N/A:N/A] Exposed Structures: [12:Fascia: No Tendon: No Muscle: No Joint: No Bone: No Small (1-33%)] [13:Fat Layer (Subcutaneous Tissue): No Tendon: No Muscle: No Joint: No Bone: No Small (1-33%)] [N/A:N/A] Epithelialization: [12:N/A] [13:Debridement - Excisional] [N/A:N/A] Debridement: Pre-procedure Verification/Time Out N/A [13:13:30] [N/A:N/A] Taken: [12:N/A] [13:Other] [N/A:N/A] Pain Control: [12:N/A] [13:Subcutaneous, Slough] [N/A:N/A] Tissue Debrided: [12:N/A] [13:Skin/Subcutaneous Tissue] [N/A:N/A] Level: [12:N/A] [13:0.55] [N/A:N/A] Debridement A (sq cm): [12:rea N/A] [13:Curette] [N/A:N/A] Instrument: [12:N/A] [13:Minimum] [N/A:N/A] Bleeding: [12:N/A] [13:Pressure] [N/A:N/A] Hemostasis A chieved: [12:N/A] [13:7] [N/A:N/A] Procedural Pain: [12:N/A] [13:4] [N/A:N/A] Post Procedural Pain: [12:N/A] [13:Procedure was tolerated well] [N/A:N/A] Debridement Treatment Response: [12:N/A] [13:1.1x0.5x0.2] [N/A:N/A] Post Debridement Measurements L x W x D (cm) [12:N/A] [13:0.086] [N/A:N/A] Post Debridement Volume: (cm) [12:Compression Therapy] [13:Debridement] [N/A:N/A] Treatment Notes Electronic Signature(s) Signed: 07/25/2020 6:06:29 PM By: [name] RN, BSN Signed: 07/28/2020 1:46:42 PM By: [name] MD Entered By: [name] on 07/25/2020  13:42:53 -------------------------------------------------------------------------------- Multi-Disciplinary Care Plan Details Patient Name: Date of Service: MALIKIAH, DEBARR HA M 07/25/2020 1:15 PM Medical Record Number: [id number] Patient Account Number: [id number] Date of Birth/Sex: Treating RN: 24-Oct-1945 (74 y.o. [name] Primary Care Ayiana Winslett: [name] Other Clinician: Referring Jedidiah Demartini: Treating Camie Hauss/Extender: [name] in Treatment: 3 Active Inactive Abuse / Safety / Falls / Self Care Management Nursing Diagnoses: Potential for falls Goals: Patient/caregiver will verbalize/demonstrate measures taken to prevent injury and/or falls Date Initiated: 07/04/2020 Target Resolution Date: 08/01/2020 Goal Status: Active Interventions: Assess fall risk on admission and as needed Assess impairment of mobility on admission and as needed per policy Notes: Nutrition Nursing Diagnoses: Impaired glucose control: actual or potential Potential for alteratiion in Nutrition/Potential for imbalanced nutrition Goals: Patient/caregiver will maintain therapeutic glucose control Date Initiated: 07/04/2020 Target Resolution Date: 08/01/2020 Goal Status: Active Interventions: Assess HgA1c results as ordered upon admission and as needed Assess patient nutrition upon admission and as needed per policy Treatment Activities: Patient referred to Primary Care Physician for further nutritional evaluation : 07/04/2020 Notes: Venous Leg Ulcer Nursing Diagnoses: Knowledge deficit related to disease process and management Potential for venous Insuffiency (use before diagnosis confirmed) Goals: Patient will maintain optimal edema control Date Initiated: 07/04/2020 Target Resolution Date: 08/01/2020 Goal Status: Active Interventions: Assess peripheral edema status every visit. Compression as ordered Provide education on venous  insufficiency  Treatment Activities: Therapeutic compression applied : 07/04/2020 Notes: Wound/Skin Impairment Nursing Diagnoses: Impaired tissue integrity Knowledge deficit related to ulceration/compromised skin integrity Goals: Patient/caregiver will verbalize understanding of skin care regimen Date Initiated: 07/04/2020 Target Resolution Date: 08/01/2020 Goal Status: Active Ulcer/skin breakdown will have a volume reduction of 30% by week 4 Date Initiated: 07/04/2020 Target Resolution Date: 08/01/2020 Goal Status: Active Interventions: Assess patient/caregiver ability to obtain necessary supplies Assess patient/caregiver ability to perform ulcer/skin care regimen upon admission and as needed Assess ulceration(s) every visit Provide education on ulcer and skin care Treatment Activities: Skin care regimen initiated : 07/04/2020 Topical wound management initiated : 07/04/2020 Notes: Electronic Signature(s) Signed: 07/25/2020 6:06:29 PM By: [name] RN, BSN Entered By: [name] on 07/25/2020 13:33:19 -------------------------------------------------------------------------------- Pain Assessment Details Patient Name: Date of Service: RAIFORD, FETTERMAN HA M 07/25/2020 1:15 PM Medical Record Number: [id number] Patient Account Number: [id number] Date of Birth/Sex: Treating RN: 1946/02/12 (73 y.o. Oval Linsey Primary Care Dorion Petillo: [name] Other Clinician: Referring Amadeus Oyama: Treating Daegen Berrocal/Extender: [name] in Treatment: 3 Active Problems Location of Pain Severity and Description of Pain Patient Has Paino No Site Locations Pain Management and Medication Current Pain Management: Electronic Signature(s) Signed: 07/25/2020 5:00:24 PM By: Carlene Coria RN Signed: 07/25/2020 5:00:24 PM By: Carlene Coria RN Entered By: Carlene Coria on 07/25/2020  13:12:37 -------------------------------------------------------------------------------- Patient/Caregiver Education Details Patient Name: Date of Service: JAELEN, SOTH HA M 11/5/2021andnbsp1:15 PM Medical Record Number: [id number] Patient Account Number: [id number] Date of Birth/Gender: Treating RN: September 09, 1946 (74 y.o. [name] Primary Care Physician: [name] Other Clinician: Referring Physician: Treating Physician/Extender: [name] in Treatment: 3 Education Assessment Education Provided To: Patient Education Topics Provided Venous: Methods: Explain/Verbal Responses: Reinforcements needed, State content correctly Wound/Skin Impairment: Methods: Explain/Verbal Responses: Reinforcements needed, State content correctly Electronic Signature(s) Signed: 07/25/2020 6:06:29 PM By: [name] RN, BSN Entered By: [name] on 07/25/2020 13:34:39 -------------------------------------------------------------------------------- Wound Assessment Details Patient Name: Date of Service: ANTONINO, NIENHUIS HA M 07/25/2020 1:15 PM Medical Record Number: [id number] Patient Account Number: [id number] Date of Birth/Sex: Treating RN: 20-May-1946 (73 y.o. [name]) Carlene Coria Primary Care Parnell Spieler: [name] Other Clinician: Referring Reginae Wolfrey: Treating Samyra Limb/Extender: [name] in Treatment: 3 Wound Status Wound Number: 12 Primary Diabetic Wound/Ulcer of the Lower Extremity Etiology: Wound Location: Left, Lateral Lower Leg Wound Open Wounding Event: Trauma Status: Date Acquired: 05/26/2020 Comorbid Cataracts, Anemia, Arrhythmia, Congestive Heart Failure, Weeks Of Treatment: 3 History: Hypertension, Type II Diabetes, Gout, Neuropathy Clustered Wound: No Wound Measurements Length: (cm) 2 Width: (cm) 1 Depth: (cm) 0.2 Area: (cm) 1.571 Volume: (cm) 0.314 % Reduction in Area:  54.5% % Reduction in Volume: 77.3% Epithelialization: Small (1-33%) Tunneling: No Undermining: No Wound Description Classification: Grade 2 Wound Margin: Distinct, outline attached Exudate Amount: Medium Exudate Type: Serosanguineous Exudate Color: red, brown Foul Odor After Cleansing: No Slough/Fibrino Yes Wound Bed Granulation Amount: Small (1-33%) Exposed Structure Granulation Quality: Pink Fascia Exposed: No Necrotic Amount: Large (67-100%) Fat Layer (Subcutaneous Tissue) Exposed: Yes Necrotic Quality: Adherent Slough Tendon Exposed: No Muscle Exposed: No Joint Exposed: No Bone Exposed: No Treatment Notes Wound #12 (Left, Lateral Lower Leg) 1. Cleanse With Wound Cleanser Soap and water 2. Periwound Care Moisturizing lotion 3. Primary Dressing Applied Hydrofera Blue 4. Secondary Dressing Dry Gauze 6. Support Layer Applied 3 layer compression wrap Electronic Signature(s) Signed: 07/25/2020 5:00:24 PM By: Carlene Coria RN Entered By: Carlene Coria on 07/25/2020  13:19:32 -------------------------------------------------------------------------------- Wound Assessment Details Patient Name: Date of Service: RHEN, KAWECKI 07/25/2020 1:15 PM Medical Record Number: [id number] Patient Account Number: [id number] Date of Birth/Sex: Treating RN: Mar 12, 1946 (73 y.o. [name]) Carlene Coria Primary Care Jami Bogdanski: [name] Other Clinician: Referring Nyajah Hyson: Treating Saloni Lablanc/Extender: [name] in Treatment: 3 Wound Status Wound Number: 13 Primary Diabetic Wound/Ulcer of the Lower Extremity Etiology: Wound Location: Left T Fifth oe Wound Open Wounding Event: Gradually Appeared Status: Date Acquired: 06/18/2020 Comorbid Cataracts, Anemia, Arrhythmia, Congestive Heart Failure, Weeks Of Treatment: 3 History: Hypertension, Type II Diabetes, Gout, Neuropathy Clustered Wound: No Wound Measurements Length: (cm) 1.1 Width: (cm)  0.5 Depth: (cm) 0.2 Area: (cm) 0.432 Volume: (cm) 0.086 % Reduction in Area: 23.5% % Reduction in Volume: -50.9% Epithelialization: Small (1-33%) Tunneling: No Undermining: No Wound Description Classification: Grade 2 Wound Margin: Distinct, outline attached Exudate Amount: None Present Foul Odor After Cleansing: No Slough/Fibrino Yes Wound Bed Granulation Amount: None Present (0%) Exposed Structure Necrotic Amount: Large (67-100%) Fascia Exposed: No Necrotic Quality: Adherent Slough Fat Layer (Subcutaneous Tissue) Exposed: No Tendon Exposed: No Muscle Exposed: No Joint Exposed: No Bone Exposed: No Treatment Notes Wound #13 (Left Toe Fifth) 1. Cleanse With Wound Cleanser 3. Primary Dressing Applied Hydrofera Blue 4. Secondary Dressing Dry Gauze Roll Gauze 5. Secured With Medipore tape Notes netting. Electronic Signature(s) Signed: 07/25/2020 5:00:24 PM By: Carlene Coria RN Entered By: Carlene Coria on 07/25/2020 13:19:51 -------------------------------------------------------------------------------- Vitals Details Patient Name: Date of Service: SKIPPER, DACOSTA HA M 07/25/2020 1:15 PM Medical Record Number: [id number] Patient Account Number: [id number] Date of Birth/Sex: Treating RN: August 31, 1946 (73 y.o. [name]) Carlene Coria Primary Care Diavion Labrador: [name] Other Clinician: Referring Krystopher Kuenzel: Treating Nevada Mullett/Extender: [name] in Treatment: 3 Vital Signs Time Taken: 13:12 Temperature (F): 98.5 Height (in): 68 Pulse (bpm): 80 Weight (lbs): 192 Respiratory Rate (breaths/min): 20 Body Mass Index (BMI): 29.2 Blood Pressure (mmHg): 177/86 Reference Range: 80 - 120 mg / dl Electronic Signature(s) Signed: 07/25/2020 5:00:24 PM By: Carlene Coria RN Entered By: Carlene Coria on 07/25/2020 13:12:29

## 2020-07-29 ENCOUNTER — Encounter (HOSPITAL_BASED_OUTPATIENT_CLINIC_OR_DEPARTMENT_OTHER): Payer: Medicare Other | Admitting: Internal Medicine

## 2020-08-01 ENCOUNTER — Encounter (HOSPITAL_BASED_OUTPATIENT_CLINIC_OR_DEPARTMENT_OTHER): Payer: Medicare Other | Admitting: Internal Medicine

## 2020-08-01 ENCOUNTER — Other Ambulatory Visit: Payer: Self-pay

## 2020-08-01 DIAGNOSIS — E11622 Type 2 diabetes mellitus with other skin ulcer: Secondary | ICD-10-CM | POA: Diagnosis not present

## 2020-08-04 NOTE — Progress Notes (Signed)
[name], DEROO ([id number]) Visit Report for 08/01/2020 Debridement Details Patient Name: Date of Service: AH, BOTT 08/01/2020 8:00 A M Medical Record Number: [id number] Patient Account Number: [id number] Date of Birth/Sex: Treating RN: 07-07-1946 (74 y.o. [name] Primary Care Provider: [name] Other Clinician: Referring Provider: Treating Provider/Extender: [name] in Treatment: 4 Debridement Performed for Assessment: Wound #13 Left T Fifth oe Performed By: Physician Ricard Dillon., MD Debridement Type: Debridement Severity of Tissue Pre Debridement: Fat layer exposed Level of Consciousness (Pre-procedure): Awake and Alert Pre-procedure Verification/Time Out Yes - 08:45 Taken: Start Time: 08:46 Pain Control: Lidocaine 5% topical ointment T Area Debrided (L x W): otal 1 (cm) x 0.4 (cm) = 0.4 (cm) Tissue and other material debrided: Viable, Non-Viable, Slough, Subcutaneous, Fibrin/Exudate, Slough Level: Skin/Subcutaneous Tissue Debridement Description: Excisional Instrument: Curette Bleeding: Minimum Hemostasis Achieved: Pressure End Time: 08:48 Procedural Pain: 8 Post Procedural Pain: 5 Response to Treatment: Procedure was tolerated well Level of Consciousness (Post- Awake and Alert procedure): Post Debridement Measurements of Total Wound Length: (cm) 1 Width: (cm) 0.4 Depth: (cm) 0.2 Volume: (cm) 0.063 Character of Wound/Ulcer Post Debridement: Requires Further Debridement Severity of Tissue Post Debridement: Fat layer exposed Post Procedure Diagnosis Same as Pre-procedure Electronic Signature(s) Signed: 08/01/2020 5:58:22 PM By: [name] RN, BSN Signed: 08/04/2020 9:07:52 AM By: [name] MD Entered By: [name] on 08/01/2020 09:23:15 -------------------------------------------------------------------------------- HPI Details Patient Name: Date of Service: [name] HA M 08/01/2020 8:00 A M Medical Record Number: [id number] Patient Account Number: [id number] Date of Birth/Sex: Treating RN: [birth year] (74 y.o. [name] Primary Care Provider: [name] Other Clinician: Referring Provider: Treating Provider/Extender: [name] in Treatment: 4 History of Present Illness HPI Description: [name] HPI Description: 74 year old gentleman who was recently seen by his nephrologist [name], and noted to have a wound on his left lower extremity which was lacerated 2 months ago and now has reopened. The patient's left shin has a ulceration with some exudate but no evidence of infection and he was referred to [place] for further care as it was known that the patient has had some peripheral vascular disease in the past. Past medical history significant for chronic kidney disease, atrial fibrillation, diabetes mellitus,status post kidney transplant in 1983 and 2005, a week fistula graft placement, status post previous bowel surgery. 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 10/06/2015 -- x-ray of the left tibia and fibula shows no evidence of osteomyelitis. The patient has also had Doppler studies of his extremity and is awaiting the appointment with the vascular surgeon. We have not yet received these reports. 10/13/2015 -- lower extremity venous duplex reflux evaluation shows reflux in the left common femoral vein, left saphenofemoral junction and the proximal greater saphenous vein extending to the proximal calf. There is also reflux in the left proximal to mid small saphenous vein. Arterial duplex studies done showed the resting ABI was not applicable due to tibial artery medial calcification. 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 The right  TBI was 1 which is normal and the left ABI was 1 which is normal. Patient has otherwise been doing fine and has been compliant with his dressings. 10/20/2015 -- He was seen by [name] recently for a vascular  opinion on 10/15/2015. His left lower extremity venous insufficiency duplex study revealed GSV reflux,SS vein reflux and deep venous reflux in the common femoral vein. His ABIs were non compressible and his TBI on the right was 1.01 and on the left was 0.80. He was asked to continue with the wound care with compressive therapy followed by EVLA of the left GS vein 3 months. He recommended 20-30 mm thigh- high compression stockings and the need for a three-month trial of this. The patient had an Unna boot applied at the vascular office but he could not tolerate this with a lot of pain and issues with his toes and hence came here on Friday for removal of this and we reapplied a 2 layer compression. 11/10/2015 -- patient still has not purchased his 20-30 mm thigh-high compression stockings as prescribed by [name]. Readmission: 08/08/18 on evaluation today patient presents for readmission concerning a new injury to the left anterior lower extremity. He was previously seen in 2017 here in our clinic. He states that he has done fairly well since that point. Nonetheless he is having at this time some pain but states that he hit this on a table that fell over and actually struck his leg. This appears to have pulled back some of his skin which folded in on itself and is causing some difficulty as far as that is concerned. There does not appear to be any evidence of infection at this time. No fevers, chills, nausea, or vomiting noted at this time. He's been using dressings on his own currently without complication. 08/15/18 on evaluation today patient actually appears to be doing somewhat better in regard to his wound of the lower [name] when compared to the first visit last week. I had to do a  much more extensive debridement at that time it does appear that I'm gonna have to perform some debridement today but it does not look to be as extensive by any means. Nonetheless fortunately he does not show any signs of infection he does have discomfort at this site. I believe based on what I'm seeing currently he may benefit from Iodoflex to help keep the wound bed clean. Patient tolerated therapy without complication. Upon evaluation today the patient actually appears to be doing excellent in regard to his left lower extremity ulcer. This is much better than the previous two visits where he had a lot of necrotic tissue around the edge of the wound simply due to the fact that again there was a significant skin tear where the edge had been cleared away prior to reattaching and being able to heal appropriately. He seems to be doing much better at this point. 08/28/18 on evaluation today the patient's wound actually does appear to be showing signs of improvement. With that being said though he is improving he would likely note even greater improvement if we were able to sharply debride the wound. Nonetheless this caused him to much discomfort he tells me. 09/04/18 on evaluation today patient actually appears to be showing signs of improvement in regard to his left lower extremity ulcer. He has been tolerating the dressing changes including the wrap although he tells me at this point that the burning does last for a couple of days even with just the Iodoflex. I was afraid that this may been part of the issue that he was having with discomfort. It does seem to be the case. Nonetheless he shows no signs of evidence of infection at this time which is  good news. No fevers chills noted ADMISSION to [name] 10/05/2018 This is a patient who was cared for in 2017 and again in the fall of this year at our sister clinic and [place]. He actually lives in [place] in [place]. We have been  dealing with an apparent traumatic area on the left anterior tibial area. This is been present for the last several months. He was supposed to be using Iodoflex Kerlix and Coban however he was hospitalized from 09/05/2018 through 09/11/2018 with delirium secondary to pneumonia. Since then he is only been putting Vaseline gauze on this without compression. He also has a more recent skin tear on the dorsal right hand that may have only happened in the last week. The patient had arterial studies done in 2017 in January which was 3 years ago. At that point he had noncompressible ABIs but really quite good TBI's both normal. Triphasic waveforms on the right monophasic at the left posterior tibial but triphasic at the left dorsalis pedis. His ABIs in our clinic today were both noncompressible 1/23; the patient has wounds on the right dorsal hand just distal to the wrist and on the left anterior lower extremity. Both of these look very healthy he is using Hydrofera Blue 1/30; left anterior lower extremity wound much smaller. Healthy looking surface. The laceration area just distal to the wrist on the dorsal hand on the right is also just about closed I used Hydrofera Blue here 2/6; left anterior lower extremity wound is much smaller but still open. The laceration area just distal to the wrist on the dorsal hand is fully epithelialized. 2/13; the patient's anterior lower extremity wound is closed. The laceration just distal to the wrist on the dorsal hand is also fully epithelialized and closed. The patient has external compression stockings which I think are 20/30 READMISSION 08/06/2019 Mr. Haapala is a 74 year old man with had several times previously in our clinic. He is a diabetic with a history of chronic renal insufficiency status post kidney transplant in 1983 and again in 2005. He was then in 2017 with a laceration on the left lower extremity. He was worked up at the time with arterial studies  and reflux studies. The arterial studies showed ABIs to be noncompressible but TBI's were within normal limits. I do not have the reflux studies at the moment. He was also sent here in 2019 with a left lower extremity wound and then again in 2020 with left lower extremity trauma a skin tear on the wrist. He was discharged with 20/30 stockings identified from myself that that might not be enough compression. Nevertheless he states he was wearing these fairly reliably. In September he had a fall with a substantial bruise in the area of the wound. He says he saw orthopedics and they told him there was some muscle strain sometime it later this opened into a wound. He has a fairly substantial wound on the right posterior calf. Satellite areas around this including medially and posteriorly. He has not worn his stockings since the injury Past medical history; includes chronic renal failure secondary to diabetes with kidney transplant x2, atrial fibrillation, heart failure with preserved ejection fraction, coronary artery disease. ABIs on the right in our clinic were once again noncompressible 08/13/2019 on evaluation today patient appears to be doing decently well with regard to his wound compared to last week's evaluation. Unfortunately he is still having a lot of discomfort at this point which is I think in some  part due to the 3 layer compression wrap which is a little bit stronger I think for him. When he was here before we actually utilized a Kerlix and Coban wrap which he states seemed to got a little bit better. Nonetheless I think we can probably drop back to this in light of the discomfort that he had. Nonetheless the pain was not really right around the wound itself as much as it was around the ankle in particular. The Iodoflex does seem to have done well for him As the wound is appearing somewhat better today which is excellent news. 11/30; still complaining of a lot of pain. Apparently arterial  studies I ordered 2 weeks ago are below. I do not believe we have an appointment with vein and vascular as of yet; ABI Findings: +---------+------------------+-----+----------+--------+ Right Rt Pressure (mmHg)IndexWaveform Comment  +---------+------------------+-----+----------+--------+ PTA >254 1.50 monophasic  +---------+------------------+-----+----------+--------+ DP >254 1.50 monophasic  +---------+------------------+-----+----------+--------+ Great T [id number] 0.40 Abnormal   +---------+------------------+-----+----------+--------+ +---------+------------------+-----+----------+-------+ Left Lt Pressure (mmHg)IndexWaveform Comment +---------+------------------+-----+----------+-------+ Brachial 169     +---------+------------------+-----+----------+-------+ PTA >254 1.50 monophasic  +---------+------------------+-----+----------+-------+ DP >254 1.50 monophasic  +---------+------------------+-----+----------+-------+ Great T oe40 0.24 Abnormal   +---------+------------------+-----+----------+-------+ Pedal arteries appear hyperemic. Patient refused Brachial pressure in the right arm. Summary: Right: Resting right ankle-brachial index indicates noncompressible right lower extremity arteries. The right toe-brachial index is abnormal. Left: Resting left ankle-brachial index indicates noncompressible left lower extremity arteries. The left toe-brachial index is abnormal. He is also having considerably more swelling in his left calf. This was not there when I last saw him 2 weeks ago. He tells me that some of the home health compression wraps have been slipping down and that may be the issue here however a month I am uncertain 57/8; sees vascular on December 22. Still complaining of a lot of pain. DVT study I did last time was negative for DVT 12/14; still complaining of pain which if this is arterial is certainly claudication and rest  keeps him uncomfortable at night. He has an appointment with vein and vascular on December 22. Wound surface is better using Iodoflex. Once the surface of this is satisfactory and we have exhausted the vascular route. Perhaps an advanced treatment option. He has a configuration of the venous ulceration although his arterial studies are not very good. The other issue is the patient has a transplanted kidney. This will make angiography difficult and challenging issue 12/21; still complaining of pain and drainage. We are using Iodoflex on the wound under compression. He sees Dr. Donnetta Hutching tomorrow to evaluate his noninvasive studies noted above. He has a transplanted kidney further complicating the options for angiography. 12/28; still using Iodoflex under compression. I have Dr. Luther Parody note from 12/22. he noted arterial studies revealing monophasic waveforms at the pedal vessels bilaterally and calcified vessels making the ABI unreliable. He did not comment on the reduced TBI's. He felt these were venous wounds based on the palpable dorsalis pedis pulse. 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 And they arranged for formal venous duplex with reflux studies in the next several weeks. Follow-up with either Scot Dock or Dr. Oneida Alar 09/24/2019 still using Iodoflex under compression. He has an appointment with Dr. Doren Custard on 1/7 with regards to his venous disease. The patient was not felt to have a primary arterial problem for the nonhealing of his wound. We did attempt to wrap him with 3 layer when he first came into the clinic he complained of a lot of pain in the ankle although this  may have been because the dressing fell down somewhat. He has far too edema fluid in the right leg for a good prognosis about healing this wound He comes in today with an excoriation on the bottom part of his right fifth toe. He thinks he may have done this taking off his clothes and that something got caught on the toe. There is  no open wound however the toe itself is very painful 1/11; we are using Iodoflex under compression. The wound bed is clean. He went on to see Dr. Doren Custard on 09/27/2019 he again feels that the patient's arterial supply is adequate. He feels that he might benefit from right greater saphenous vein ablation for a venous ulcer. In the meantime the area that he was complaining about last week on the right fifth toe. An x-ray that I ordered showed marked soft tissue swelling along the distal aspect of the right foot but there was no evidence of osteomyelitis. He comes in today with the fifth toenail just about coming off. He has black eschar underneath the toe on the plantar aspect. The toe was swollen red and very painful. In the right setting this could be a significant soft tissue infection versus ischemia of the toe itself. He did not show this to Dr. Doren Custard 1/18; I am using Iodoflex under compression a large wound on the right posterior calf. He had his right greater saphenous vein ablation by Dr. Doren Custard although I am not sure that is the only problem here. The right fifth toe which was possibly trauma 2 weeks ago continues to be exceptionally painful with a necrotic tip. Maybe not quite as swollen. I started him empirically on Augmentin 5/125 1 p.o. twice daily last week after discussing this with Dr. Loletha Grayer of nephrology. Perhaps somewhat better this week but not as good as I was hoping. A plain x-ray was negative. He comes in today with an area on the medial right calf that was blistered and now open. In looking at things he appears to be systemically fluid volume overloaded. He has a transplanted kidney. He states he takes his Lasix variably when he has appointments he tries not to take it the later I am really not certain if he takes this reliably. However he has far too much edema fluid in the right leg to easily heal this wound and he appears to be developing blisters medially to form additional  wounds 1/25; his CO2 angiogram was done by Dr. Doren Custard last week. This showed the proximal arteries all to be patent. On the right side the common femoral deep femoral superficial femoral popliteal anterior tibial and peroneal arteries were patent. The posterior tibial was occluded but reconstituted distally via collaterals from the peroneal artery he was felt he should have enough blood flow to heal his wounds including the right fifth toe. The right fifth toe looks better however he is still complaining of a lot of pain. The large area which is a venous ulceration posteriorly has a better looking surface I think we can switch to Hydrofera Blue today 2/1; the patient's original wounds on the right posterior medial calf has come down in width however superior to this he has new denuded areas and I am concerned we simply do not have enough edema control. He has already undergone a right greater saphenous ablation. He had a CO2 angiogram done by Dr. Doren Custard and the comment was that we should have enough blood flow to heal the venous wound however we simply  do not seem to have enough edema control with 3 layer compression. The patient is status post kidney transplant although his exact renal function is not really clear. Nor am i sure what dose of diuretic he is supposed to be on. He also has the area on the right fifth toe which was unclear etiology but I think became secondarily infected I gave him 2 weeks of antibiotics for this and this seems to have settled down he still has a black eschar over the tip of the toe. X-ray was negative for fracture. He says he has a history of gout. 2/8; the patient's wound on the right posterior calf is about the same. The superficial area medially also about the same. He got a prescription for prednisoneo Gout after he developed erythema on the dorsal aspect of his left great toe going along with the right fifth toe which has been problematic all along. He has not taken  it because he is concerned about increasing CBGs. He has a transplanted kidney is already on prednisone 5 mg. He would not be a good candidate for NSAIDs. Perhaps colchicine. He is not aware of what his uric acid level is 2/15; his right posterior calf wound seems to be coming in in terms of width. Everything here looks fairly good. No mechanical debridement we have been using Hydrofera Blue. He has had an ablation by vein and vascular. Felt to have adequate arterial supply to this area. The patient got prednisone last week from Dr. Angelique Holm of nephrology. At my urging he actually took it. The area on his left toe was a lot better. The right toe was not as painful but still erythematous with a wound at the tip. We have been using silver alginate here 2/22; right posterior calf seems to be gradually epithelializing. Still a fairly substantial wound. He still has an area on the tip of his right fifth toe which I think was gout related. This is gradually improving. We have been using Unna boots to wrap X-ray I did of the foot last time was again negative there was soft tissue irregularity about the distal fifth digit but no radiographic evidence of bone damage 3/1; right posterior calf seems to be gradually improving however there were areas of hyper granulation. We have been using Hydrofera Blue. The hyper granulation is mostly evident in the most superior finger shape projection of the wound itself. The area on the right fifth toe also was slough covered and required debridement. 3/8 continued improvement in the right posterior calf and the tip of the right fifth toe. We have been using Hydrofera Blue under compression he is changing the area to the toe 3/22; continued improvement in the right posterior calf. Unfortunately comes in today with a new skin tear in the mid anterior tibia area this probably had something to do with changing his dressing home health I called and left this a message last  week. 3/29; continued improvement in the a substantial wound on the right posterior calf. The skin tear anteriorly from last week is already healed on the tip of the right fifth toe there is still a nonviable area. 4/5; we have continued contraction of the substantial wound on the right posterior calf. We continue to have problems with the tip of the right fifth toe. We have been using Hydrofera Blue to both wound areas He is complaining about some discomfort in the Achilles area. T me he had surgery for what sounds like a torn Achilles  about 10 years ago. ells 4/12; we have continued contraction of the substantial wound on the right posterior calf. Still an area on the tip of the right fifth toe. I changed him to silver collagen on the toe last week but I think home health continue to use Hydrofera Blue to both wound areas. 4/19; we continue to have contraction of the substantial wound on the right posterior calf however there continues to be hyper granulation. The area on the tip of the fifth toe is very small still some debris on the surface we have been using silver collagen 4/26; both wounds have contracted. I was hoping the fifth toe wound would close over but with probing there is still an open small hole here. 5/3; his wound on the right posterior calf which was his major area continues to contract looks healthy we have been using Hydrofera Blue. Using silver collagen to the fifth toe, not making a lot of progress here 5/10; right posterior calf wound continues to be smaller in terms of surface area and look healthy we have been using Hydrofera Blue here. The tip of the right fifth toe is still open requiring debridement. He has a new laceration on the right forearm he says he hit this on a microwave 6 days ago 02/11/20-The right fifth toe wound is closed, the right posterior calf wound looks healthy with smaller dimensions compared with last time, the right forearm area of laceration has a  small blister which I try to open up with scalpel with very little fluid 6/7 the right fifth toe looks fine. Area on the right forearm is also closed. Right posterior calf wound continues to contract. Much smaller area remaining with healthy surface. We are using Hydrofera Blue under compression 6/21; area on the right posterior calf. There is only 1 small area in the most distal part of this. We have been using Hydrofera Blue under compression this is contracting surface looks healthy 6/28; right posterior calf this is healed. This was initially a hematoma. During the stay in this clinic he had a right greater saphenous vein ablation by Dr. Doren Custard of vein and vascular. He also had a difficult time with his right fifth toeo Infection versus gout. Very difficult time with this so that this has been closed for about 6 weeks. ------------------- READMISSION 04/29/20-Patient returns to the clinic this time with a right index finger swelling and pain been drainage, patient has been to the hand surgeon and vascular surgeon and ended up having a small IandD attempted in the office with expression of some gritty whitish material, patient also hit his hand and developed a small hematoma around this area. He also had vascular Dopplers done that show monophasic right radial right ulnar waveforms. He has follow-up appointments with vascular surgery as well as the hand surgeon. His appointment today with a hand surgeon he was told that he may lose the tip of his index finger if surgery is done to open up the entire area. He is reluctant to have any surgery and would like to try conservative measures He has tried epsom salt soaks with improvement then worsening , he has just finished a course of antibiotics, he does not remember the name 8/16; patient was readmitted to the clinic last week and I am seeing this for the first time. Previously we had looked at this man for a large wound in the right posterior calf  that was initially a hematoma. He went on to have a greater  saphenous vein ablation. During the stay here he also had a difficult time with an area over his right fifth toe question infectious question gout at that time. This eventually closed over although I do not think the differential was ever really totally clear in my mind. The patient is here this time with a necrotic area on the palmar surface of his right index finger. He states this started in July with a small red area. He spent some time going to see Dr. Jarome Matin of dermatology. Ended up with Dr. 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 He is also seen hand surgery which is fortunate is he is likely to require either debridement or amputation of this finger. He had a course of antibiotics Keflex and doxycycline I believe but he is finished these. Currently using Betadine. He describes this is very painful. I have not had a chance to look back over notes in care everywhere. I would like to see as hand surgery notes, vascular surgery notes and dermatology notes if this is possible. He also apparently had an x-ray ordered by Dr. Amedeo Plenty that I would like to review. 9/38; certainly no improvement in his right index finger palmar aspect. Culture I did last time showed abundant Enterobacter and abundant Proteus. I have him on cefdinir 300 mg every 24 as of Friday in response to this. This was a deep tissue culture. 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 He still complaining of a lot of pain. READMISSION 07/04/2020 Mr. Conradt is a 74 year old man with wounds on his bilateral lower extremities and bilateral upper extremities which were apparently the consequence of being found down at home in early September. He required admission to the hospital from 9/6 through 9/17 with delirium, hyponatremia, sepsis and type 2 diabetes. He gradually  improved. He was sent to a nursing home in [place] and recently has been discharged home. They are using some foam-based dressings on his bilateral lower and upper extremity wounds as well as his left fifth toe which apparently are felt to be secondary from the prolonged friction and pressure of being recumbent. We have had Mr. Liller in the clinic on 2 different occasions. Predominantly the first time for a large wound on the right posterior calf related to trauma and chronic venous insufficiency. He was here in August with a necrotic wound on the tip of his right index finger which ultimately required amputation this is still currently wrapped. 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 Vanita Panda of nephrology He has not been previously felt to have an arterial issue previous ABIs were noncompressible 10/22; readmitted to the clinic last week. His wounds on the upper extremities are healed as well as the right lower extremity. He still has an area on the left lateral calf and the lateral part of his fifth toe. We have been using silver alginate. He looks like he has had a lot of edema improvement with the recent addition to his diuretics by Dr. Jonn Shingles of nephrology 10/29;. He has 2 wounds on the left lateral calf and the dorsal aspect the left 5th toe nonviable surface. We are using silver alginate 11/5; he did not tolerate Iodoflex. Home health use silver  alginate on both the left lateral calf and the left fifth toe. 11/12; the area on the left lateral leg looks better. However the area on the dorsal fifth toe is covered in an adherent black eschar and necrotic debris. We tried Hydrofera Blue the patient could not tolerate Iodoflex  however he comes in today with a pack of Iodoflex saying why cannot we use this. I talked to him about this saying I had like to use the Iodoflex especially on the toe and he wanted to try it again after our discussion. Electronic Signature(s) Signed: 08/04/2020 9:07:52 AM By: [name] MD Entered By: [name] on 08/01/2020 09:24:08 -------------------------------------------------------------------------------- Physical Exam Details Patient Name: Date of Service: PHILIP, KOTLYAR HA M 08/01/2020 8:00 A M Medical Record Number: [id number] Patient Account Number: [id number] Date of Birth/Sex: Treating RN: [birth year] (74 y.o. [name] Primary Care Provider: [name] Other Clinician: Referring Provider: Treating Provider/Extender: [name] in Treatment: 4 Constitutional Patient is hypertensive.. Pulse regular and within target range for patient.Marland Kitchen Respirations regular, non-labored and within target range.. Temperature is normal and within the target range for the patient.. No change in weight.. Appears in no distress. Cardiovascular Pedal pulses are palpable on the left. Notes Wound exam; The left fifth toe has a very small punched out necrotic wound. Very difficult debridement with a #3 curette to get down to viable tissue. He does not tolerated this well although historically he has not tolerated anything with discomfort well. The left lateral leg wound actually looks better today cleaner surface with Electronic Signature(s) Signed: 08/04/2020 9:07:52 AM By: [name] MD Entered By: [name] on 08/01/2020 09:25:21 -------------------------------------------------------------------------------- Physician Orders Details Patient Name: Date of Service: DEMARKO, ZEIMET HA M 08/01/2020 8:00 A M Medical Record Number: [id number] Patient Account Number: [id number] Date of Birth/Sex: Treating RN: 06/21/1946 (74 y.o.  [name] Primary Care Provider: [name] Other Clinician: Referring Provider: Treating Provider/Extender: [name] in Treatment: 4 Verbal / Phone Orders: No Diagnosis Coding ICD-10 Coding Code Description [phone] Non-pressure chronic ulcer of left calf with fat layer exposed 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 of skin E11.622 Type 2 diabetes mellitus with other skin ulcer I87.323 Chronic venous hypertension (idiopathic) with inflammation of bilateral lower extremity Follow-up Appointments Return appointment in 3 weeks. - 12/3 with Dr. Dellia Nims Dressing Change Frequency Wound #12 Left,Lateral Lower Leg Other: - 2 times per week Wound #13 Left T Fifth oe Other: - 2 times per week Skin Barriers/Peri-Wound Care Moisturizing lotion - to left leg with dressing changes and right leg daily Wound Cleansing May shower with protection. May shower and wash wound with soap and water. - on days when dressings are changed Primary Wound Dressing Wound #12 Left,Lateral Lower Leg Hydrofera Blue - classic moistened with saline Wound #13 Left T Fifth oe Iodoflex - may use hydrofera blue if unable to tolerate iodoflex Secondary Dressing Wound #12 Left,Lateral Lower Leg Dry Gauze Wound #13 Left T Fifth oe Kerlix/Rolled Gauze Dry Gauze Edema Control 3 Layer Compression System - Left Lower Extremity Avoid standing for long periods of time Elevate legs to the level of the heart or above for 30 minutes daily and/or when sitting, a frequency of: - whenever sitting throughout the day Exercise regularly Support Garment 20-30 mm/Hg pressure to: - compression stocking to right leg daily, apply in first thing in the morning and remove at  night [facility] for wound care. - [facility] - home health to change dressings 2 times per week for next 2 weeks Electronic  Signature(s) Signed: 08/01/2020 5:58:22 PM By: [name] RN, BSN Signed: 08/04/2020 9:07:52 AM By: [name] MD Entered By: [name] on 08/01/2020 08:56:18 -------------------------------------------------------------------------------- Problem List Details Patient Name: Date of Service: [name], [name] HA M 08/01/2020 8:00 A M Medical Record Number: [id number] Patient Account Number: [id number] Date of Birth/Sex: Treating RN: 12-06-1945 (74 y.o. [name] Primary Care Provider: [name] Other Clinician: Referring Provider: Treating Provider/Extender: [name] in Treatment: 4 Active Problems ICD-10 Encounter Code Description Active Date MDM Diagnosis [phone] Non-pressure chronic ulcer of left calf with fat layer exposed 07/04/2020 No Yes 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 of 07/04/2020 No Yes skin E11.622 Type 2 diabetes mellitus with other skin ulcer 07/04/2020 No Yes I87.323 Chronic venous hypertension (idiopathic) with inflammation of bilateral lower 07/04/2020 No Yes extremity Inactive Problems ICD-10 Code Description Active Date Inactive Date L97.211 Non-pressure chronic ulcer of right calf limited to breakdown of skin 07/04/2020 07/04/2020 S40.812D Abrasion of left upper arm, subsequent encounter 07/04/2020 07/04/2020 S40.811D Abrasion of right upper arm, subsequent encounter 07/04/2020 07/04/2020 Resolved Problems Electronic Signature(s) Signed: 08/04/2020 9:07:52 AM By: [name] MD Entered By: [name] on 08/01/2020 09:21:31 -------------------------------------------------------------------------------- Progress Note Details Patient Name: Date of Service: [name] HA M 08/01/2020 8:00 A M Medical Record Number: [id number] Patient Account Number: [id number] Date of Birth/Sex: Treating RN: Jul 13, 1946 (74 y.o. [name] Primary Care Provider: [name] Other Clinician: Referring Provider: Treating Provider/Extender: [name] in Treatment: 4 Subjective History of Present Illness (HPI) [place] HPI Description: 74 year old gentleman who was recently seen by his nephrologist [name], and noted to have a wound on his left lower extremity which was lacerated 2 months ago and now has reopened. The patient's left shin has a ulceration with some exudate but no evidence of infection and he was referred to [place] for further care as it was known that the patient has had some peripheral vascular disease in the past. Past medical history significant for chronic kidney disease, atrial fibrillation, diabetes mellitus,status post kidney transplant in 1983 and 2005, a week fistula graft placement, status post previous bowel surgery. 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 10/06/2015 -- x-ray of the left tibia and fibula shows no evidence of osteomyelitis. The patient has also had Doppler studies of his extremity and is awaiting the appointment with the vascular surgeon. We have not yet received these reports. 10/13/2015 -- lower extremity venous duplex reflux evaluation shows reflux in the left common femoral vein, left saphenofemoral junction and the proximal greater saphenous vein extending to the proximal calf. There is also reflux in the left proximal to mid small saphenous vein. Arterial duplex studies done showed the resting ABI was not applicable due to tibial artery medial calcification. 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 The right TBI was 1 which is normal and the left ABI was 1 which is normal. Patient has otherwise been doing fine and has been compliant with his dressings. 10/20/2015 --  He was seen by [name] recently for a vascular opinion  on 10/15/2015. His left lower extremity venous insufficiency duplex study revealed GSV reflux,SS vein reflux and deep venous reflux in the common femoral vein. His ABIs were non compressible and his TBI on the right was 1.01 and on the left was 0.80. He was asked to continue with the wound care with compressive therapy followed by EVLA of the left GS vein 3 months. He recommended 20-30 mm thigh- high compression stockings and the need for a three-month trial of this. The patient had an Unna boot applied at the vascular office but he could not tolerate this with a lot of pain and issues with his toes and hence came here on Friday for removal of this and we reapplied a 2 layer compression. 11/10/2015 -- patient still has not purchased his 20-30 mm thigh-high compression stockings as prescribed by [name]. Readmission: 08/08/18 on evaluation today patient presents for readmission concerning a new injury to the left anterior lower extremity. He was previously seen in 2017 here in our clinic. He states that he has done fairly well since that point. Nonetheless he is having at this time some pain but states that he hit this on a table that fell over and actually struck his leg. This appears to have pulled back some of his skin which folded in on itself and is causing some difficulty as far as that is concerned. There does not appear to be any evidence of infection at this time. No fevers, chills, nausea, or vomiting noted at this time. He's been using dressings on his own currently without complication. 08/15/18 on evaluation today patient actually appears to be doing somewhat better in regard to his wound of the lower [name] when compared to the first visit last week. I had to do a much more extensive debridement at that time it does appear that I'm gonna have to perform some debridement today but it does not look to be as extensive by any  means. Nonetheless fortunately he does not show any signs of infection he does have discomfort at this site. I believe based on what I'm seeing currently he may benefit from Iodoflex to help keep the wound bed clean. Patient tolerated therapy without complication. Upon evaluation today the patient actually appears to be doing excellent in regard to his left lower extremity ulcer. This is much better than the previous two visits where he had a lot of necrotic tissue around the edge of the wound simply due to the fact that again there was a significant skin tear where the edge had been cleared away prior to reattaching and being able to heal appropriately. He seems to be doing much better at this point. 08/28/18 on evaluation today the patient's wound actually does appear to be showing signs of improvement. With that being said though he is improving he would likely note even greater improvement if we were able to sharply debride the wound. Nonetheless this caused him to much discomfort he tells me. 09/04/18 on evaluation today patient actually appears to be showing signs of improvement in regard to his left lower extremity ulcer. He has been tolerating the dressing changes including the wrap although he tells me at this point that the burning does last for a couple of days even with just the Iodoflex. I was afraid that this may been part of the issue that he was having with discomfort. It does seem to be the case. Nonetheless he shows no signs of evidence of infection at this time which is good  news. No fevers chills noted ADMISSION to [name] 10/05/2018 This is a patient who was cared for in 2017 and again in the fall of this year at our sister clinic and [place]. He actually lives in [place] in [place]. We have been dealing with an apparent traumatic area on the left anterior tibial area. This is been present for the last several months. He was supposed to be using Iodoflex  Kerlix and Coban however he was hospitalized from 09/05/2018 through 09/11/2018 with delirium secondary to pneumonia. Since then he is only been putting Vaseline gauze on this without compression. He also has a more recent skin tear on the dorsal right hand that may have only happened in the last week. The patient had arterial studies done in 2017 in January which was 3 years ago. At that point he had noncompressible ABIs but really quite good TBI's both normal. Triphasic waveforms on the right monophasic at the left posterior tibial but triphasic at the left dorsalis pedis. His ABIs in our clinic today were both noncompressible 1/23; the patient has wounds on the right dorsal hand just distal to the wrist and on the left anterior lower extremity. Both of these look very healthy he is using Hydrofera Blue 1/30; left anterior lower extremity wound much smaller. Healthy looking surface. The laceration area just distal to the wrist on the dorsal hand on the right is also just about closed I used Hydrofera Blue here 2/6; left anterior lower extremity wound is much smaller but still open. The laceration area just distal to the wrist on the dorsal hand is fully epithelialized. 2/13; the patient's anterior lower extremity wound is closed. The laceration just distal to the wrist on the dorsal hand is also fully epithelialized and closed. The patient has external compression stockings which I think are 20/30 READMISSION 08/06/2019 Mr. Weidner is a 74 year old man with had several times previously in our clinic. He is a diabetic with a history of chronic renal insufficiency status post kidney transplant in 1983 and again in 2005. He was then in 2017 with a laceration on the left lower extremity. He was worked up at the time with arterial studies and reflux studies. The arterial studies showed ABIs to be noncompressible but TBI's were within normal limits. I do not have the reflux studies at the moment.  He was also sent here in 2019 with a left lower extremity wound and then again in 2020 with left lower extremity trauma a skin tear on the wrist. He was discharged with 20/30 stockings identified from myself that that might not be enough compression. Nevertheless he states he was wearing these fairly reliably. In September he had a fall with a substantial bruise in the area of the wound. He says he saw orthopedics and they told him there was some muscle strain sometime it later this opened into a wound. He has a fairly substantial wound on the right posterior calf. Satellite areas around this including medially and posteriorly. He has not worn his stockings since the injury Past medical history; includes chronic renal failure secondary to diabetes with kidney transplant x2, atrial fibrillation, heart failure with preserved ejection fraction, coronary artery disease. ABIs on the right in our clinic were once again noncompressible 08/13/2019 on evaluation today patient appears to be doing decently well with regard to his wound compared to last week's evaluation. Unfortunately he is still having a lot of discomfort at this point which is I think in some part  due to the 3 layer compression wrap which is a little bit stronger I think for him. When he was here before we actually utilized a Kerlix and Coban wrap which he states seemed to got a little bit better. Nonetheless I think we can probably drop back to this in light of the discomfort that he had. Nonetheless the pain was not really right around the wound itself as much as it was around the ankle in particular. The Iodoflex does seem to have done well for him As the wound is appearing somewhat better today which is excellent news. 11/30; still complaining of a lot of pain. Apparently arterial studies I ordered 2 weeks ago are below. I do not believe we have an appointment with vein and vascular as of yet; ABI  Findings: +---------+------------------+-----+----------+--------+ Right Rt Pressure (mmHg)IndexWaveform Comment  +---------+------------------+-----+----------+--------+ PTA >254 1.50 monophasic  +---------+------------------+-----+----------+--------+ DP >254 1.50 monophasic  +---------+------------------+-----+----------+--------+ Great T [id number] 0.40 Abnormal   +---------+------------------+-----+----------+--------+ +---------+------------------+-----+----------+-------+ Left Lt Pressure (mmHg)IndexWaveform Comment +---------+------------------+-----+----------+-------+ Brachial 169     +---------+------------------+-----+----------+-------+ PTA >254 1.50 monophasic  +---------+------------------+-----+----------+-------+ DP >254 1.50 monophasic  +---------+------------------+-----+----------+-------+ Great T oe40 0.24 Abnormal   +---------+------------------+-----+----------+-------+ Pedal arteries appear hyperemic. Patient refused Brachial pressure in the right arm. Summary: Right: Resting right ankle-brachial index indicates noncompressible right lower extremity arteries. The right toe-brachial index is abnormal. Left: Resting left ankle-brachial index indicates noncompressible left lower extremity arteries. The left toe-brachial index is abnormal. He is also having considerably more swelling in his left calf. This was not there when I last saw him 2 weeks ago. He tells me that some of the home health compression wraps have been slipping down and that may be the issue here however a month I am uncertain 95/6; sees vascular on December 22. Still complaining of a lot of pain. DVT study I did last time was negative for DVT 12/14; still complaining of pain which if this is arterial is certainly claudication and rest keeps him uncomfortable at night. He has an appointment with vein and vascular on December 22. Wound surface is better  using Iodoflex. Once the surface of this is satisfactory and we have exhausted the vascular route. Perhaps an advanced treatment option. He has a configuration of the venous ulceration although his arterial studies are not very good. The other issue is the patient has a transplanted kidney. This will make angiography difficult and challenging issue 12/21; still complaining of pain and drainage. We are using Iodoflex on the wound under compression. He sees Dr. Donnetta Hutching tomorrow to evaluate his noninvasive studies noted above. He has a transplanted kidney further complicating the options for angiography. 12/28; still using Iodoflex under compression. I have Dr. Luther Parody note from 12/22. he noted arterial studies revealing monophasic waveforms at the pedal vessels bilaterally and calcified vessels making the ABI unreliable. He did not comment on the reduced TBI's. He felt these were venous wounds based on the palpable dorsalis pedis pulse. 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 And they arranged for formal venous duplex with reflux studies in the next several weeks. Follow-up with either Scot Dock or Dr. Oneida Alar 09/24/2019 still using Iodoflex under compression. He has an appointment with Dr. Doren Custard on 1/7 with regards to his venous disease. The patient was not felt to have a primary arterial problem for the nonhealing of his wound. We did attempt to wrap him with 3 layer when he first came into the clinic he complained of a lot of pain in the ankle although this may  have been because the dressing fell down somewhat. He has far too edema fluid in the right leg for a good prognosis about healing this wound He comes in today with an excoriation on the bottom part of his right fifth toe. He thinks he may have done this taking off his clothes and that something got caught on the toe. There is no open wound however the toe itself is very painful 1/11; we are using Iodoflex under compression. The wound bed is  clean. He went on to see Dr. Doren Custard on 09/27/2019 he again feels that the patient's arterial supply is adequate. He feels that he might benefit from right greater saphenous vein ablation for a venous ulcer. In the meantime the area that he was complaining about last week on the right fifth toe. An x-ray that I ordered showed marked soft tissue swelling along the distal aspect of the right foot but there was no evidence of osteomyelitis. He comes in today with the fifth toenail just about coming off. He has black eschar underneath the toe on the plantar aspect. The toe was swollen red and very painful. In the right setting this could be a significant soft tissue infection versus ischemia of the toe itself. He did not show this to Dr. Doren Custard 1/18; I am using Iodoflex under compression a large wound on the right posterior calf. He had his right greater saphenous vein ablation by Dr. Doren Custard although I am not sure that is the only problem here. ooThe right fifth toe which was possibly trauma 2 weeks ago continues to be exceptionally painful with a necrotic tip. Maybe not quite as swollen. I started him empirically on Augmentin 5/125 1 p.o. twice daily last week after discussing this with Dr. Loletha Grayer of nephrology. Perhaps somewhat better this week but not as good as I was hoping. A plain x-ray was negative. He comes in today with an area on the medial right calf that was blistered and now open. In looking at things he appears to be systemically fluid volume overloaded. He has a transplanted kidney. He states he takes his Lasix variably when he has appointments he tries not to take it the later I am really not certain if he takes this reliably. However he has far too much edema fluid in the right leg to easily heal this wound and he appears to be developing blisters medially to form additional wounds 1/25; his CO2 angiogram was done by Dr. Doren Custard last week. This showed the proximal arteries all to be patent. On the  right side the common femoral deep femoral superficial femoral popliteal anterior tibial and peroneal arteries were patent. The posterior tibial was occluded but reconstituted distally via collaterals from the peroneal artery he was felt he should have enough blood flow to heal his wounds including the right fifth toe. The right fifth toe looks better however he is still complaining of a lot of pain. The large area which is a venous ulceration posteriorly has a better looking surface I think we can switch to Hydrofera Blue today 2/1; the patient's original wounds on the right posterior medial calf has come down in width however superior to this he has new denuded areas and I am concerned we simply do not have enough edema control. He has already undergone a right greater saphenous ablation. He had a CO2 angiogram done by Dr. Doren Custard and the comment was that we should have enough blood flow to heal the venous wound however we simply do  not seem to have enough edema control with 3 layer compression. The patient is status post kidney transplant although his exact renal function is not really clear. Nor am i sure what dose of diuretic he is supposed to be on. He also has the area on the right fifth toe which was unclear etiology but I think became secondarily infected I gave him 2 weeks of antibiotics for this and this seems to have settled down he still has a black eschar over the tip of the toe. X-ray was negative for fracture. He says he has a history of gout. 2/8; the patient's wound on the right posterior calf is about the same. The superficial area medially also about the same. He got a prescription for prednisoneo Gout after he developed erythema on the dorsal aspect of his left great toe going along with the right fifth toe which has been problematic all along. He has not taken it because he is concerned about increasing CBGs. He has a transplanted kidney is already on prednisone 5 mg. He would not  be a good candidate for NSAIDs. Perhaps colchicine. He is not aware of what his uric acid level is 2/15; his right posterior calf wound seems to be coming in in terms of width. Everything here looks fairly good. No mechanical debridement we have been using Hydrofera Blue. He has had an ablation by vein and vascular. Felt to have adequate arterial supply to this area. The patient got prednisone last week from Dr. Angelique Holm of nephrology. At my urging he actually took it. The area on his left toe was a lot better. The right toe was not as painful but still erythematous with a wound at the tip. We have been using silver alginate here 2/22; right posterior calf seems to be gradually epithelializing. Still a fairly substantial wound. He still has an area on the tip of his right fifth toe which I think was gout related. This is gradually improving. We have been using Unna boots to wrap X-ray I did of the foot last time was again negative there was soft tissue irregularity about the distal fifth digit but no radiographic evidence of bone damage 3/1; right posterior calf seems to be gradually improving however there were areas of hyper granulation. We have been using Hydrofera Blue. The hyper granulation is mostly evident in the most superior finger shape projection of the wound itself. ooThe area on the right fifth toe also was slough covered and required debridement. 3/8 continued improvement in the right posterior calf and the tip of the right fifth toe. We have been using Hydrofera Blue under compression he is changing the area to the toe 3/22; continued improvement in the right posterior calf. Unfortunately comes in today with a new skin tear in the mid anterior tibia area this probably had something to do with changing his dressing home health I called and left this a message last week. 3/29; continued improvement in the a substantial wound on the right posterior calf. The skin tear anteriorly from  last week is already healed on the tip of the right fifth toe there is still a nonviable area. 4/5; we have continued contraction of the substantial wound on the right posterior calf. We continue to have problems with the tip of the right fifth toe. We have been using Hydrofera Blue to both wound areas He is complaining about some discomfort in the Achilles area. T me he had surgery for what sounds like a torn Achilles about  10 years ago. ells 4/12; we have continued contraction of the substantial wound on the right posterior calf. ooStill an area on the tip of the right fifth toe. I changed him to silver collagen on the toe last week but I think home health continue to use Hydrofera Blue to both wound areas. 4/19; we continue to have contraction of the substantial wound on the right posterior calf however there continues to be hyper granulation. ooThe area on the tip of the fifth toe is very small still some debris on the surface we have been using silver collagen 4/26; both wounds have contracted. I was hoping the fifth toe wound would close over but with probing there is still an open small hole here. 5/3; his wound on the right posterior calf which was his major area continues to contract looks healthy we have been using Hydrofera Blue. Using silver collagen to the fifth toe, not making a lot of progress here 5/10; right posterior calf wound continues to be smaller in terms of surface area and look healthy we have been using Hydrofera Blue here. The tip of the right fifth toe is still open requiring debridement. He has a new laceration on the right forearm he says he hit this on a microwave 6 days ago 02/11/20-The right fifth toe wound is closed, the right posterior calf wound looks healthy with smaller dimensions compared with last time, the right forearm area of laceration has a small blister which I try to open up with scalpel with very little fluid 6/7 the right fifth toe looks fine. Area  on the right forearm is also closed. Right posterior calf wound continues to contract. Much smaller area remaining with healthy surface. We are using Hydrofera Blue under compression 6/21; area on the right posterior calf. There is only 1 small area in the most distal part of this. We have been using Hydrofera Blue under compression this is contracting surface looks healthy 6/28; right posterior calf this is healed. This was initially a hematoma. During the stay in this clinic he had a right greater saphenous vein ablation by Dr. Doren Custard of vein and vascular. He also had a difficult time with his right fifth toeo Infection versus gout. Very difficult time with this so that this has been closed for about 6 weeks. ------------------- READMISSION 04/29/20-Patient returns to the clinic this time with a right index finger swelling and pain been drainage, patient has been to the hand surgeon and vascular surgeon and ended up having a small IandD attempted in the office with expression of some gritty whitish material, patient also hit his hand and developed a small hematoma around this area. He also had vascular Dopplers done that show monophasic right radial right ulnar waveforms. He has follow-up appointments with vascular surgery as well as the hand surgeon. His appointment today with a hand surgeon he was told that he may lose the tip of his index finger if surgery is done to open up the entire area. He is reluctant to have any surgery and would like to try conservative measures He has tried epsom salt soaks with improvement then worsening , he has just finished a course of antibiotics, he does not remember the name 8/16; patient was readmitted to the clinic last week and I am seeing this for the first time. Previously we had looked at this man for a large wound in the right posterior calf that was initially a hematoma. He went on to have a greater saphenous vein  ablation. During the stay here he also had  a difficult time with an area over his right fifth toe question infectious question gout at that time. This eventually closed over although I do not think the differential was ever really totally clear in my mind. The patient is here this time with a necrotic area on the palmar surface of his right index finger. He states this started in July with a small red area. He spent some time going to see Dr. Jarome Matin of dermatology. Ended up with Dr. 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 He is also seen hand surgery which is fortunate is he is likely to require either debridement or amputation of this finger. He had a course of antibiotics Keflex and doxycycline I believe but he is finished these. Currently using Betadine. He describes this is very painful. I have not had a chance to look back over notes in care everywhere. I would like to see as hand surgery notes, vascular surgery notes and dermatology notes if this is possible. He also apparently had an x-ray ordered by Dr. Amedeo Plenty that I would like to review. 2/84; certainly no improvement in his right index finger palmar aspect. Culture I did last time showed abundant Enterobacter and abundant Proteus. I have him on cefdinir 300 mg every 24 as of Friday in response to this. This was a deep tissue culture. 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 He still complaining of a lot of pain. READMISSION 07/04/2020 Mr. Kanady is a 74 year old man with wounds on his bilateral lower extremities and bilateral upper extremities which were apparently the consequence of being found down at home in early September. He required admission to the hospital from 9/6 through 9/17 with delirium, hyponatremia, sepsis and type 2 diabetes. He gradually improved. He was sent to a nursing home in [place] and recently has been discharged home. They are using some  foam-based dressings on his bilateral lower and upper extremity wounds as well as his left fifth toe which apparently are felt to be secondary from the prolonged friction and pressure of being recumbent. We have had Mr. [name] in the clinic on 2 different occasions. Predominantly the first time for a large wound on the right posterior calf related to trauma and chronic venous insufficiency. He was here in August with a necrotic wound on the tip of his right index finger which ultimately required amputation this is still currently wrapped. 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 Vanita Panda of nephrology He has not been previously felt to have an arterial issue previous ABIs were noncompressible 10/22; readmitted to the clinic last week. His wounds on the upper extremities are healed as well as the right lower extremity. He still has an area on the left lateral calf and the lateral part of his fifth toe. We have been using silver alginate. He looks like he has had a lot of edema improvement with the recent addition to his diuretics by Dr. Jonn Shingles of nephrology 10/29;. He has 2 wounds on the left lateral calf and the dorsal aspect the left 5th toe nonviable surface. We are using silver alginate 11/5; he did not tolerate Iodoflex. Home health use silver alginate  on both the left lateral calf and the left fifth toe. 11/12; the area on the left lateral leg looks better. However the area on the dorsal fifth toe is covered in an adherent black eschar and necrotic debris. We tried Hydrofera Blue the patient could not tolerate Iodoflex however he comes in today with a pack of Iodoflex saying why cannot we use this. I talked to him about this saying I  had like to use the Iodoflex especially on the toe and he wanted to try it again after our discussion. Objective Constitutional Patient is hypertensive.. Pulse regular and within target range for patient.Marland Kitchen Respirations regular, non-labored and within target range.. Temperature is normal and within the target range for the patient.. No change in weight.. Appears in no distress. Vitals Time Taken: 8:10 AM, Height: 68 in, Weight: 192 lbs, BMI: 29.2, Temperature: 98.5 F, Pulse: 86 bpm, Respiratory Rate: 18 breaths/min, Blood Pressure: 160/80 mmHg. General Notes: manual BP taken. Cardiovascular Pedal pulses are palpable on the left. General Notes: Wound exam; ooThe left fifth toe has a very small punched out necrotic wound. Very difficult debridement with a #3 curette to get down to viable tissue. He does not tolerated this well although historically he has not tolerated anything with discomfort well. ooThe left lateral leg wound actually looks better today cleaner surface with Integumentary (Hair, Skin) Wound #12 status is Open. Original cause of wound was Trauma. The wound is located on the Left,Lateral Lower Leg. The wound measures 1cm length x 0.7cm width x 0.1cm depth; 0.55cm^2 area and 0.055cm^3 volume. There is Fat Layer (Subcutaneous Tissue) exposed. There is no tunneling or undermining noted. There is a medium amount of serosanguineous drainage noted. The wound margin is distinct with the outline attached to the wound base. There is large (67- 100%) pink granulation within the wound bed. There is a small (1-33%) amount of necrotic tissue within the wound bed including Adherent Slough. Wound #13 status is Open. Original cause of wound was Gradually Appeared. The wound is located on the Left T Fifth. The wound measures 1cm length x oe 0.4cm width x 0.2cm depth; 0.314cm^2 area and 0.063cm^3 volume. There is no tunneling or undermining noted. There is a none present amount of drainage noted.  The wound margin is distinct with the outline attached to the wound base. There is no granulation within the wound bed. There is a large (67-100%) amount of necrotic tissue within the wound bed including Eschar. 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 There was a Excisional Skin/Subcutaneous Tissue Debridement with a total area of 0.4 sq cm performed by Ricard Dillon., MD. With the following instrument(s): Curette to remove Viable and Non-Viable tissue/material. Material removed includes Subcutaneous Tissue, Slough, and Fibrin/Exudate after achieving pain control using Lidocaine 5% topical ointment. No specimens were taken. A time out was conducted at 08:45, prior to the start of the procedure. A Minimum amount of bleeding was controlled with Pressure. The procedure was tolerated well with a pain level of 8 throughout and a pain level of 5 following the procedure. Post Debridement  Measurements: 1cm length x 0.4cm width x 0.2cm depth; 0.063cm^3 volume. Character of Wound/Ulcer Post Debridement requires further debridement. Severity of Tissue Post Debridement is: Fat layer exposed. Post procedure Diagnosis Wound #13: Same as Pre-Procedure Wound #12 Pre-procedure diagnosis of Wound #12 is a Diabetic Wound/Ulcer of the Lower Extremity located on the Left,Lateral Lower Leg . There was a Three Layer Compression Therapy Procedure by [name], RN. Post procedure Diagnosis Wound #12: Same as Pre-Procedure Plan Follow-up  Appointments: Return appointment in 3 weeks. - 12/3 with Dr. Dellia Nims Dressing Change Frequency: Wound #12 Left,Lateral Lower Leg: Other: - 2 times per week Wound #13 Left T Fifth: oe Other: - 2 times per week Skin Barriers/Peri-Wound Care: Moisturizing lotion - to left leg with dressing changes and right leg daily Wound Cleansing: May shower with protection. May shower and wash wound with soap and water. - on days when dressings are changed Primary Wound Dressing: Wound #12 Left,Lateral Lower Leg: Hydrofera Blue - classic moistened with saline Wound #13 Left T Fifth: oe Iodoflex - may use hydrofera blue if unable to tolerate iodoflex Secondary Dressing: Wound #12 Left,Lateral Lower Leg: Dry Gauze Wound #13 Left T Fifth: oe Kerlix/Rolled Gauze Dry Gauze Edema Control: 3 Layer Compression System - Left Lower Extremity Avoid standing for long periods of time Elevate legs to the level of the heart or above for 30 minutes daily and/or when sitting, a frequency of: - whenever sitting throughout the day Exercise regularly Support Garment 20-30 mm/Hg pressure to: - compression stocking to right leg daily, apply in first thing in the morning and remove at night Home Health: [facility] for wound care. - [facility] - home health to change dressings 2 times per week for next 2 weeks 1. The patient wanted to switch back to the Iodoflex on the left fifth toe I gave him permission to try but if he could not tolerate it then back to the [facility] Blue 2. Left lateral leg Hydrofera Blue. This wound looks better 3. 3 layer compression of the left lower extremity. 4. He has home health were going to put him out 3 weeks Electronic Signature(s) Signed: 08/04/2020 9:07:52 AM By: [name] MD Entered By: [name] on 08/01/2020 09:26:09 -------------------------------------------------------------------------------- SuperBill Details Patient Name: Date of  Service: ASHRITH, SAGAN HA M 08/01/2020 Medical Record Number: [id number] Patient Account Number: [id number] Date of Birth/Sex: Treating RN: 09/04/1946 (74 y.o. [name] Primary Care Provider: [name] Other Clinician: Referring Provider: Treating Provider/Extender: [name] in Treatment: 4 Diagnosis Coding ICD-10 Codes Code Description [phone] Non-pressure chronic ulcer of left calf with fat layer exposed 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 of skin E11.622 Type 2 diabetes mellitus with other skin ulcer I87.323 Chronic venous hypertension (idiopathic) with inflammation of bilateral lower extremity Facility Procedures CPT4 Code: [id number] Description: 56389 - DEB SUBQ TISSUE 20 SQ CM/< ICD-10 Diagnosis Description 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 of s Modifier: kin Quantity: 1 Physician Procedures : CPT4 Code Description Modifier [id number] - WC PHYS SUBQ TISS 20 SQ CM ICD-10 Diagnosis Description 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 of skin Quantity: 1 Electronic Signature(s) Signed: 08/04/2020 9:07:52 AM By: [name] MD Signed: 08/04/2020 9:07:52 AM By: [name] MD Entered By: [name] on 08/01/2020 68:11:57

## 2020-08-04 NOTE — Progress Notes (Signed)
LEXX, MONTE ([id number]) Visit Report for 08/01/2020 Arrival Information Details Patient Name: Date of Service: [name], [name] 08/01/2020 8:00 A M Medical Record Number: [id number] Patient Account Number: [id number] Date of Birth/Sex: Treating RN: [birth year] (74 y.o. [name], [name] Primary Care Madicyn Mesina: [name] Other Clinician: Referring Sevrin Sally: Treating Jaide Hillenburg/Extender: [name] in Treatment: 4 Visit Information History Since Last Visit Added or deleted any medications: No Patient Arrived: Ambulatory Any new allergies or adverse reactions: No Arrival Time: 07:56 Had a fall or experienced change in No Accompanied By: self activities of daily living that may affect Transfer Assistance: None risk of falls: Patient Identification Verified: Yes Signs or symptoms of abuse/neglect since last visito No Secondary Verification Process Completed: Yes Hospitalized since last visit: No Patient Requires Transmission-Based Precautions: No Implantable device outside of the clinic excluding No Patient Has Alerts: No cellular tissue based products placed in the center since last visit: Has Dressing in Place as Prescribed: Yes Has Compression in Place as Prescribed: Yes Pain Present Now: Yes Electronic Signature(s) Signed: 08/01/2020 5:09:42 PM By: [name] Entered By: [name] on 08/01/2020 07:59:39 -------------------------------------------------------------------------------- Compression Therapy Details Patient Name: Date of Service: [name], [name] HA M 08/01/2020 8:00 A M Medical Record Number: [id number] Patient Account Number: [id number] Date of Birth/Sex: Treating RN: [birth year] (74 y.o. [name] Primary Care Minda Faas: [name] Other Clinician: Referring Rosamae Rocque: Treating Mikaella Escalona/Extender: [name] in Treatment: 4 Compression Therapy Performed for  Wound Assessment: Wound #12 Left,Lateral Lower Leg Performed By: Clinician Michael Pilling, RN Compression Type: Three Layer Post Procedure Diagnosis Same as Pre-procedure Electronic Signature(s) Signed: 08/01/2020 5:58:22 PM By: Michael Gouty RN, BSN Entered By: [name] on 08/01/2020 08:47:32 -------------------------------------------------------------------------------- Encounter Discharge Information Details Patient Name: Date of Service: [name], [name] HA M 08/01/2020 8:00 A M Medical Record Number: [id number] Patient Account Number: [id number] Date of Birth/Sex: Treating RN: 11-Aug-1946 (74 y.o. [name] Primary Care Samaia Iwata: [name] Other Clinician: Referring Charmon Thorson: Treating Leocadia Idleman/Extender: [name] in Treatment: 4 Encounter Discharge Information Items Post Procedure Vitals Discharge Condition: Stable Temperature (F): 98.5 Ambulatory Status: Ambulatory Pulse (bpm): 86 Discharge Destination: Home Respiratory Rate (breaths/min): 18 Transportation: Private Auto Blood Pressure (mmHg): 160/80 Accompanied By: self Schedule Follow-up Appointment: Yes Clinical Summary of Care: Electronic Signature(s) Signed: 08/01/2020 5:09:42 PM By: [name] Entered By: [name] on 08/01/2020 11:37:48 -------------------------------------------------------------------------------- Lower Extremity Assessment Details Patient Name: Date of Service: [name], [name] HA M 08/01/2020 8:00 A M Medical Record Number: [id number] Patient Account Number: [id number] Date of Birth/Sex: Treating RN: 09-Mar-1946 (74 y.o. [name] Primary Care [name] Copher: [name] Other Clinician: Referring Tanaiya Kolarik: Treating Mohammed Mcandrew/Extender: [name] in Treatment: 4 Edema Assessment Assessed: [name]: Yes] [name]: No] Edema: [Left: Ye] [Right: s] Calf Left: Right: Point of  Measurement: 42 cm From Medial Instep 33.5 cm Ankle Left: Right: Point of Measurement: 18 cm From Medial Instep 19.5 cm Vascular Assessment Pulses: Dorsalis Pedis Palpable: [Left:Yes] Electronic Signature(s) Signed: 08/01/2020 5:09:42 PM By: [name] Entered By: [name] on 08/01/2020 08:03:37 -------------------------------------------------------------------------------- Multi Wound Chart Details Patient Name: Date of Service: [name] HA M 08/01/2020 8:00 A M Medical Record Number: [id number] Patient Account Number: [id number] Date of Birth/Sex: Treating RN: Jan 01, 1946 (74 y.o. [name] Primary Care Keerthi Hazell: [name] Other Clinician: Referring Walther Sanagustin: Treating Katee Wentland/Extender: [name] in Treatment:  4 Vital Signs Height(in): 68 Pulse(bpm): 86 Weight(lbs): 192 Blood Pressure(mmHg): 160/80 Body Mass Index(BMI): 29 Temperature(F): 98.5 Respiratory Rate(breaths/min): 18 Photos: [12:No Photos Left, Lateral Lower Leg] [13:No Photos Left T Fifth oe] [N/A:N/A N/A] Wound Location: [12:Trauma] [13:Gradually Appeared] [N/A:N/A] Wounding Event: [12:Diabetic Wound/Ulcer of the Lower] [13:Diabetic Wound/Ulcer of the Lower] [N/A:N/A] Primary Etiology: [12:Extremity Cataracts, Anemia, Arrhythmia,] [13:Extremity Cataracts, Anemia, Arrhythmia,] [N/A:N/A] Comorbid History: [12:Congestive Heart Failure, Hypertension, Type II Diabetes, Gout, Hypertension, Type II Diabetes, Gout, Neuropathy 05/26/2020] [13:Congestive Heart Failure, Neuropathy 06/18/2020] [N/A:N/A] Date Acquired: [12:4] [13:4] [N/A:N/A] Weeks of Treatment: [12:Open] [13:Open] [N/A:N/A] Wound Status: [12:1x0.7x0.1] [13:1x0.4x0.2] [N/A:N/A] Measurements [name] x W x D (cm) [12:0.55] [62:0.355] [N/A:N/A] A (cm) : rea [12:0.055] [13:0.063] [N/A:N/A] Volume (cm) : [12:84.10%] [13:44.40%] [N/A:N/A] % Reduction in A [12:rea: 96.00%] [13:-10.50%] [N/A:N/A] %  Reduction in Volume: [12:Grade 2] [13:Grade 2] [N/A:N/A] Classification: [12:Medium] [13:None Present] [N/A:N/A] Exudate A mount: [12:Serosanguineous] [13:N/A] [N/A:N/A] Exudate Type: [12:red, brown] [13:N/A] [N/A:N/A] Exudate Color: [12:Distinct, outline attached] [13:Distinct, outline attached] [N/A:N/A] Wound Margin: [12:Large (67-100%)] [13:None Present (0%)] [N/A:N/A] Granulation A mount: [12:Pink] [13:N/A] [N/A:N/A] Granulation Quality: [12:Small (1-33%)] [13:Large (67-100%)] [N/A:N/A] Necrotic A mount: [12:Adherent Slough] [13:Eschar] [N/A:N/A] Necrotic Tissue: [12:Fat Layer (Subcutaneous Tissue): Yes Fascia: No] [N/A:N/A] Exposed Structures: [12:Fascia: No Tendon: No Muscle: No Joint: No Bone: No Medium (34-66%)] [13:Fat Layer (Subcutaneous Tissue): No Tendon: No Muscle: No Joint: No Bone: No None] [N/A:N/A] Epithelialization: [12:N/A] [13:Debridement - Excisional] [N/A:N/A] Debridement: Pre-procedure Verification/Time Out N/A [13:08:45] [N/A:N/A] Taken: [12:N/A] [13:Lidocaine 5% topical ointment] [N/A:N/A] Pain Control: [12:N/A] [13:Subcutaneous, Slough] [N/A:N/A] Tissue Debrided: [12:N/A] [13:Skin/Subcutaneous Tissue] [N/A:N/A] Level: [12:N/A] [13:0.4] [N/A:N/A] Debridement A (sq cm): [12:rea N/A] [13:Curette] [N/A:N/A] Instrument: [12:N/A] [13:Minimum] [N/A:N/A] Bleeding: [12:N/A] [13:Pressure] [N/A:N/A] Hemostasis A chieved: [12:N/A] [13:8] [N/A:N/A] Procedural Pain: [12:N/A] [13:5] [N/A:N/A] Post Procedural Pain: [12:N/A] [13:Procedure was tolerated well] [N/A:N/A] Debridement Treatment Response: [12:N/A] [13:1x0.4x0.2] [N/A:N/A] Post Debridement Measurements [name] x W x D (cm) [12:N/A] [13:0.063] [N/A:N/A] Post Debridement Volume: (cm) [12:Compression Therapy] [13:Debridement] [N/A:N/A] Treatment Notes Electronic Signature(s) Signed: 08/01/2020 5:58:22 PM By: Michael Gouty RN, BSN Signed: 08/04/2020 9:07:52 AM By: [name] MD Entered By: [name] on  08/01/2020 09:22:59 -------------------------------------------------------------------------------- Multi-Disciplinary Care Plan Details Patient Name: Date of Service: [name], [name] HA M 08/01/2020 8:00 A M Medical Record Number: [id number] Patient Account Number: [id number] Date of Birth/Sex: Treating RN: May 04, 1946 (74 y.o. [name] Primary Care Morio Widen: [name] Other Clinician: Referring Rubi Tooley: Treating Joy Reiger/Extender: [name] in Treatment: 4 Active Inactive Abuse / Safety / Falls / Self Care Management Nursing Diagnoses: Potential for falls Goals: Patient/caregiver will verbalize/demonstrate measures taken to prevent injury and/or falls Date Initiated: 07/04/2020 Target Resolution Date: 09/05/2020 Goal Status: Active Interventions: Assess fall risk on admission and as needed Assess impairment of mobility on admission and as needed per policy Notes: Nutrition Nursing Diagnoses: Impaired glucose control: actual or potential Potential for alteratiion in Nutrition/Potential for imbalanced nutrition Goals: Patient/caregiver will maintain therapeutic glucose control Date Initiated: 07/04/2020 Target Resolution Date: 09/05/2020 Goal Status: Active Interventions: Assess HgA1c results as ordered upon admission and as needed Assess patient nutrition upon admission and as needed per policy Treatment Activities: Patient referred to Primary Care Physician for further nutritional evaluation : 07/04/2020 Notes: Venous Leg Ulcer Nursing Diagnoses: Knowledge deficit related to disease process and management Potential for venous Insuffiency (use before diagnosis confirmed) Goals: Patient will maintain optimal edema control Date Initiated: 07/04/2020 Target Resolution Date: 09/05/2020 Goal Status: Active Interventions: Assess peripheral edema status every visit. Compression as  ordered Provide education on  venous insufficiency Treatment Activities: Therapeutic compression applied : 07/04/2020 Notes: Wound/Skin Impairment Nursing Diagnoses: Impaired tissue integrity Knowledge deficit related to ulceration/compromised skin integrity Goals: Patient/caregiver will verbalize understanding of skin care regimen Date Initiated: 07/04/2020 Target Resolution Date: 09/05/2020 Goal Status: Active Ulcer/skin breakdown will have a volume reduction of 30% by week 4 Date Initiated: 07/04/2020 Target Resolution Date: 09/05/2020 Goal Status: Active Interventions: Assess patient/caregiver ability to obtain necessary supplies Assess patient/caregiver ability to perform ulcer/skin care regimen upon admission and as needed Assess ulceration(s) every visit Provide education on ulcer and skin care Treatment Activities: Skin care regimen initiated : 07/04/2020 Topical wound management initiated : 07/04/2020 Notes: Electronic Signature(s) Signed: 08/01/2020 5:58:22 PM By: Michael Gouty RN, BSN Entered By: [name] on 08/01/2020 08:07:31 -------------------------------------------------------------------------------- Pain Assessment Details Patient Name: Date of Service: [name], [name] HA M 08/01/2020 8:00 A M Medical Record Number: [id number] Patient Account Number: [id number] Date of Birth/Sex: Treating RN: 12/07/1945 (74 y.o. [name] Primary Care Keeghan Mcintire: [name] Other Clinician: Referring Edye Hainline: Treating Aleesa Sweigert/Extender: [name] in Treatment: 4 Active Problems Location of Pain Severity and Description of Pain Patient Has Paino Yes Site Locations Pain Location: Generalized Pain, Pain in Ulcers Rate the pain. Current Pain Level: 7 Worst Pain Level: 10 Least Pain Level: 0 Tolerable Pain Level: 8 Character of Pain Describe the Pain: Aching, Heavy, Sharp Pain Management and Medication Current Pain  Management: Medication: No Cold Application: No Rest: No Massage: No Activity: No T.E.N.S.: No Heat Application: No Leg drop or elevation: No Is the Current Pain Management Adequate: Adequate How does your wound impact your activities of daily livingo Sleep: No Bathing: No Appetite: No Relationship With Others: No Bladder Continence: No Emotions: No Bowel Continence: No Work: No Toileting: No Drive: No Dressing: No Hobbies: No Electronic Signature(s) Signed: 08/01/2020 5:09:42 PM By: [name] Entered By: [name] on 08/01/2020 08:00:01 -------------------------------------------------------------------------------- Patient/Caregiver Education Details Patient Name: Date of Service: [name] HA M 11/12/2021andnbsp8:00 A M Medical Record Number: [id number] Patient Account Number: [id number] Date of Birth/Gender: Treating RN: 10-Jan-1946 (74 y.o. [name] Primary Care Physician: [name] Other Clinician: Referring Physician: Treating Physician/Extender: [name] in Treatment: 4 Education Assessment Education Provided To: Patient Education Topics Provided Venous: Methods: Explain/Verbal Responses: Reinforcements needed, State content correctly Wound/Skin Impairment: Methods: Explain/Verbal Responses: Reinforcements needed, State content correctly Electronic Signature(s) Signed: 08/01/2020 5:58:22 PM By: Michael Gouty RN, BSN Entered By: [name] on 08/01/2020 08:07:52 -------------------------------------------------------------------------------- Wound Assessment Details Patient Name: Date of Service: [name], [name] HA M 08/01/2020 8:00 A M Medical Record Number: [id number] Patient Account Number: [id number] Date of Birth/Sex: Treating RN: [birth year] (74 y.o. [name], [name] Primary Care Joevon Holliman: [name] Other Clinician: Referring Lamonica Trueba: Treating  Nayden Czajka/Extender: [name] in Treatment: 4 Wound Status Wound Number: 12 Primary Diabetic Wound/Ulcer of the Lower Extremity Etiology: Wound Location: Left, Lateral Lower Leg Wound Open Wounding Event: Trauma Status: Date Acquired: 05/26/2020 Comorbid Cataracts, Anemia, Arrhythmia, Congestive Heart Failure, Weeks Of Treatment: 4 History: Hypertension, Type II Diabetes, Gout, Neuropathy Clustered Wound: No Wound Measurements Length: (cm) 1 Width: (cm) 0.7 Depth: (cm) 0.1 Area: (cm) 0.55 Volume: (cm) 0.055 % Reduction in Area: 84.1% % Reduction in Volume: 96% Epithelialization: Medium (34-66%) Tunneling: No Undermining: No Wound Description Classification: Grade 2 Wound Margin: Distinct, outline attached Exudate Amount: Medium Exudate Type: Serosanguineous Exudate Color: red, brown Foul  Odor After Cleansing: No Slough/Fibrino Yes Wound Bed Granulation Amount: Large (67-100%) Exposed Structure Granulation Quality: Pink Fascia Exposed: No Necrotic Amount: Small (1-33%) Fat Layer (Subcutaneous Tissue) Exposed: Yes Necrotic Quality: Adherent Slough Tendon Exposed: No Muscle Exposed: No Joint Exposed: No Bone Exposed: No Treatment Notes Wound #12 (Left, Lateral Lower Leg) 1. Cleanse With Wound Cleanser Soap and water 2. Periwound Care Moisturizing lotion 3. Primary Dressing Applied Hydrofera Blue 4. Secondary Dressing Dry Gauze 6. Support Layer Applied 3 layer compression wrap Notes netting. Electronic Signature(s) Signed: 08/01/2020 5:09:42 PM By: [name] Entered By: [name] on 08/01/2020 08:07:30 -------------------------------------------------------------------------------- Wound Assessment Details Patient Name: Date of Service: ADON, GEHLHAUSEN HA M 08/01/2020 8:00 A M Medical Record Number: [id number] Patient Account Number: [id number] Date of Birth/Sex: Treating RN: [birth year] (74 y.o. [name],  [name] Primary Care Lorianne Malbrough: [name] Other Clinician: Referring Kynlie Jane: Treating Dylynn Ketner/Extender: [name] in Treatment: 4 Wound Status Wound Number: 13 Primary Diabetic Wound/Ulcer of the Lower Extremity Etiology: Wound Location: Left T Fifth oe Wound Open Wounding Event: Gradually Appeared Status: Date Acquired: 06/18/2020 Comorbid Cataracts, Anemia, Arrhythmia, Congestive Heart Failure, Weeks Of Treatment: 4 History: Hypertension, Type II Diabetes, Gout, Neuropathy Clustered Wound: No Wound Measurements Length: (cm) 1 Width: (cm) 0.4 Depth: (cm) 0.2 Area: (cm) 0.314 Volume: (cm) 0.063 % Reduction in Area: 44.4% % Reduction in Volume: -10.5% Epithelialization: None Tunneling: No Undermining: No Wound Description Classification: Grade 2 Wound Margin: Distinct, outline attached Exudate Amount: None Present Foul Odor After Cleansing: No Slough/Fibrino Yes Wound Bed Granulation Amount: None Present (0%) Exposed Structure Necrotic Amount: Large (67-100%) Fascia Exposed: No Necrotic Quality: Eschar Fat Layer (Subcutaneous Tissue) Exposed: No Tendon Exposed: No Muscle Exposed: No Joint Exposed: No Bone Exposed: No Treatment Notes Wound #13 (Left Toe Fifth) 1. Cleanse With Wound Cleanser 3. Primary Dressing Applied Iodoflex 4. Secondary Dressing Dry Gauze Roll Gauze 5. Secured With [employer]) Signed: 08/01/2020 5:09:42 PM By: [name] Entered By: [name] on 08/01/2020 08:07:52 -------------------------------------------------------------------------------- Vitals Details Patient Name: Date of Service: [name] HA M 08/01/2020 8:00 A M Medical Record Number: [id number] Patient Account Number: [id number] Date of Birth/Sex: Treating RN: Aug 22, 1946 (74 y.o. [name], Tammi Klippel Primary Care Amani Nodarse: [name] Other Clinician: Referring Daryl Beehler: Treating  Jhalen Eley/Extender: [name] in Treatment: 4 Vital Signs Time Taken: 08:10 Temperature (F): 98.5 Height (in): 68 Pulse (bpm): 86 Weight (lbs): 192 Respiratory Rate (breaths/min): 18 Body Mass Index (BMI): 29.2 Blood Pressure (mmHg): 160/80 Reference Range: 80 - 120 mg / dl Notes manual BP taken. Electronic Signature(s) Signed: 08/01/2020 5:09:42 PM By: [name] Entered By: [name] on 08/01/2020 08:11:14

## 2020-08-11 ENCOUNTER — Ambulatory Visit (HOSPITAL_COMMUNITY)
Admission: RE | Admit: 2020-08-11 | Discharge: 2020-08-11 | Disposition: A | Discharge status: Home / Self Care | Payer: Medicare Other | Source: Ambulatory Visit | Attending: Nephrology | Admitting: Nephrology

## 2020-08-11 ENCOUNTER — Other Ambulatory Visit: Payer: Self-pay

## 2020-08-11 DIAGNOSIS — N185 Chronic kidney disease, stage 5: Secondary | ICD-10-CM | POA: Insufficient documentation

## 2020-08-11 DIAGNOSIS — D631 Anemia in chronic kidney disease: Secondary | ICD-10-CM | POA: Diagnosis not present

## 2020-08-11 LAB — HEMOGLOBIN AND HEMATOCRIT, BLOOD
HCT: 32.2 % — ABNORMAL LOW (ref 39.0–52.0)
Hemoglobin: 9.5 g/dL — ABNORMAL LOW (ref 13.0–17.0)

## 2020-08-11 MED ORDER — DARBEPOETIN ALFA 200 MCG/0.4ML IJ SOSY
200.0000 ug | PREFILLED_SYRINGE | INTRAMUSCULAR | Status: DC
  Administered 2020-08-11: 200 ug via SUBCUTANEOUS
  Filled 2020-08-11: qty 0.4

## 2020-08-11 NOTE — Progress Notes (Signed)
PATIENT CARE CENTER NOTE  Diagnosis:Anemia associated with Chronic Renal Failure   Provider:Coladonato, Broadus John, MD   Procedure:Aranesp injection   Note:Patient received sub-q Aranesp injectionin left arm. Tolerated injection well. No adverse reaction noted. Pre-injection Hemoglobin9.5and BP 177/80. BP elevated but still within the parameters to receive injection per order. Discharge paperwork given to patient. Patient alert, oriented and ambulatory at discharge.

## 2020-08-11 NOTE — Discharge Instructions (Signed)
Darbepoetin Alfa injection What is this medicine? DARBEPOETIN ALFA (dar be POE e tin AL fa) helps your body make more red blood cells. It is used to treat anemia caused by chronic kidney failure and chemotherapy. This medicine may be used for other purposes; ask your health care provider or pharmacist if you have questions. COMMON BRAND NAME(S): Aranesp What should I tell my health care provider before I take this medicine? They need to know if you have any of these conditions:  blood clotting disorders or history of blood clots  cancer patient not on chemotherapy  cystic fibrosis  heart disease, such as angina, heart failure, or a history of a heart attack  hemoglobin level of 12 g/dL or greater  high blood pressure  low levels of folate, iron, or vitamin B12  seizures  an unusual or allergic reaction to darbepoetin, erythropoietin, albumin, hamster proteins, latex, other medicines, foods, dyes, or preservatives  pregnant or trying to get pregnant  breast-feeding How should I use this medicine? This medicine is for injection into a vein or under the skin. It is usually given by a health care professional in a hospital or clinic setting. If you get this medicine at home, you will be taught how to prepare and give this medicine. Use exactly as directed. Take your medicine at regular intervals. Do not take your medicine more often than directed. It is important that you put your used needles and syringes in a special sharps container. Do not put them in a trash can. If you do not have a sharps container, call your pharmacist or healthcare provider to get one. A special MedGuide will be given to you by the pharmacist with each prescription and refill. Be sure to read this information carefully each time. Talk to your pediatrician regarding the use of this medicine in children. While this medicine may be used in children as young as 1 month of age for selected conditions, precautions do  apply. Overdosage: If you think you have taken too much of this medicine contact a poison control center or emergency room at once. NOTE: This medicine is only for you. Do not share this medicine with others. What if I miss a dose? If you miss a dose, take it as soon as you can. If it is almost time for your next dose, take only that dose. Do not take double or extra doses. What may interact with this medicine? Do not take this medicine with any of the following medications:  epoetin alfa This list may not describe all possible interactions. Give your health care provider a list of all the medicines, herbs, non-prescription drugs, or dietary supplements you use. Also tell them if you smoke, drink alcohol, or use illegal drugs. Some items may interact with your medicine. What should I watch for while using this medicine? Your condition will be monitored carefully while you are receiving this medicine. You may need blood work done while you are taking this medicine. This medicine may cause a decrease in vitamin B6. You should make sure that you get enough vitamin B6 while you are taking this medicine. Discuss the foods you eat and the vitamins you take with your health care professional. What side effects may I notice from receiving this medicine? Side effects that you should report to your doctor or health care professional as soon as possible:  allergic reactions like skin rash, itching or hives, swelling of the face, lips, or tongue  breathing problems  changes in   vision  chest pain  confusion, trouble speaking or understanding  feeling faint or lightheaded, falls  high blood pressure  muscle aches or pains  pain, swelling, warmth in the leg  rapid weight gain  severe headaches  sudden numbness or weakness of the face, arm or leg  trouble walking, dizziness, loss of balance or coordination  seizures (convulsions)  swelling of the ankles, feet, hands  unusually weak or  tired Side effects that usually do not require medical attention (report to your doctor or health care professional if they continue or are bothersome):  diarrhea  fever, chills (flu-like symptoms)  headaches  nausea, vomiting  redness, stinging, or swelling at site where injected This list may not describe all possible side effects. Call your doctor for medical advice about side effects. You may report side effects to FDA at 1-800-FDA-1088. Where should I keep my medicine? Keep out of the reach of children. Store in a refrigerator between 2 and 8 degrees C (36 and 46 degrees F). Do not freeze. Do not shake. Throw away any unused portion if using a single-dose vial. Throw away any unused medicine after the expiration date. NOTE: This sheet is a summary. It may not cover all possible information. If you have questions about this medicine, talk to your doctor, pharmacist, or health care provider.  2020 Elsevier/Gold Standard (2017-09-21 16:44:20)  

## 2020-08-22 ENCOUNTER — Encounter (HOSPITAL_BASED_OUTPATIENT_CLINIC_OR_DEPARTMENT_OTHER): Payer: Medicare Other | Attending: Internal Medicine | Admitting: Internal Medicine

## 2020-08-22 ENCOUNTER — Other Ambulatory Visit: Payer: Self-pay

## 2020-08-22 DIAGNOSIS — E11621 Type 2 diabetes mellitus with foot ulcer: Secondary | ICD-10-CM | POA: Insufficient documentation

## 2020-08-22 DIAGNOSIS — E1151 Type 2 diabetes mellitus with diabetic peripheral angiopathy without gangrene: Secondary | ICD-10-CM | POA: Insufficient documentation

## 2020-08-22 DIAGNOSIS — L97521 Non-pressure chronic ulcer of other part of left foot limited to breakdown of skin: Secondary | ICD-10-CM | POA: Diagnosis not present

## 2020-08-22 DIAGNOSIS — E1122 Type 2 diabetes mellitus with diabetic chronic kidney disease: Secondary | ICD-10-CM | POA: Insufficient documentation

## 2020-08-22 DIAGNOSIS — E11622 Type 2 diabetes mellitus with other skin ulcer: Secondary | ICD-10-CM | POA: Insufficient documentation

## 2020-08-22 DIAGNOSIS — I130 Hypertensive heart and chronic kidney disease with heart failure and stage 1 through stage 4 chronic kidney disease, or unspecified chronic kidney disease: Secondary | ICD-10-CM | POA: Diagnosis not present

## 2020-08-22 DIAGNOSIS — Z940 Kidney transplant status: Secondary | ICD-10-CM | POA: Diagnosis not present

## 2020-08-22 DIAGNOSIS — I5032 Chronic diastolic (congestive) heart failure: Secondary | ICD-10-CM | POA: Insufficient documentation

## 2020-08-22 DIAGNOSIS — L97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er exposed: Secondary | ICD-10-CM | POA: Diagnosis not present

## 2020-08-22 DIAGNOSIS — N189 Chronic kidney disease, unspecified: Secondary | ICD-10-CM | POA: Insufficient documentation

## 2020-08-22 DIAGNOSIS — I87323 Chronic venous hypertension (idiopathic) with inflammation of bilateral lower extremity: Secondary | ICD-10-CM | POA: Insufficient documentation

## 2020-08-22 NOTE — Progress Notes (Signed)
KADRIAN, PARTCH ([id number]) Visit Report for 08/22/2020 Debridement Details Patient Name: Date of Service: ANAN, DAPOLITO 08/22/2020 9:15 A M Medical Record Number: [id number] Patient Account Number: [id number] Date of Birth/Sex: Treating RN: 07/13/1946 (74 y.o. [name] Primary Care Provider: [name] Other Clinician: Referring Provider: Treating Provider/Extender: [name] in Treatment: 7 Debridement Performed for Assessment: Wound #12 Left,Lateral Lower Leg Performed By: Physician Ricard Dillon., MD Debridement Type: Debridement Severity of Tissue Pre Debridement: Fat layer exposed Level of Consciousness (Pre-procedure): Awake and Alert Pre-procedure Verification/Time Out Yes - 10:18 Taken: Start Time: 10:18 Pain Control: Lidocaine 5% topical ointment T Area Debrided (L x W): otal 0.9 (cm) x 0.2 (cm) = 0.18 (cm) Tissue and other material debrided: Non-Viable, Skin: Epidermis, Fibrin/Exudate Level: Skin/Epidermis Debridement Description: Selective/Open Wound Instrument: Curette Bleeding: Minimum Hemostasis Achieved: Pressure End Time: 10:21 Procedural Pain: 2 Post Procedural Pain: 1 Response to Treatment: Procedure was tolerated well Level of Consciousness (Post- Awake and Alert procedure): Post Debridement Measurements of Total Wound Length: (cm) 0.9 Width: (cm) 0.2 Depth: (cm) 0.1 Volume: (cm) 0.014 Character of Wound/Ulcer Post Debridement: Improved Severity of Tissue Post Debridement: Fat layer exposed Post Procedure Diagnosis Same as Pre-procedure Electronic Signature(s) Signed: 08/22/2020 12:44:20 PM By: [name] MD Signed: 08/22/2020 5:01:53 PM By: [name] RN, BSN Entered By: [name] on 08/22/2020 10:42:01 -------------------------------------------------------------------------------- Debridement Details Patient Name: Date of Service: KAYLE, CORREA HA M 08/22/2020 9:15  A M Medical Record Number: [id number] Patient Account Number: [id number] Date of Birth/Sex: Treating RN: 1946-03-06 (74 y.o. [name] Primary Care Provider: [name] Other Clinician: Referring Provider: Treating Provider/Extender: [name] in Treatment: 7 Debridement Performed for Assessment: Wound #13 Left T Fifth oe Performed By: Physician Ricard Dillon., MD Debridement Type: Debridement Severity of Tissue Pre Debridement: Fat layer exposed Level of Consciousness (Pre-procedure): Awake and Alert Pre-procedure Verification/Time Out Yes - 10:18 Taken: Start Time: 10:18 Pain Control: Lidocaine 5% topical ointment T Area Debrided (L x W): otal 1 (cm) x 0.4 (cm) = 0.4 (cm) Tissue and other material debrided: Non-Viable, Slough, Subcutaneous, Slough Level: Skin/Subcutaneous Tissue Debridement Description: Excisional Instrument: Curette Bleeding: Minimum Hemostasis Achieved: Pressure End Time: 10:21 Procedural Pain: 2 Post Procedural Pain: 1 Response to Treatment: Procedure was tolerated well Level of Consciousness (Post- Awake and Alert procedure): Post Debridement Measurements of Total Wound Length: (cm) 1 Width: (cm) 0.4 Depth: (cm) 0.3 Volume: (cm) 0.094 Character of Wound/Ulcer Post Debridement: Improved Severity of Tissue Post Debridement: Fat layer exposed Post Procedure Diagnosis Same as Pre-procedure Electronic Signature(s) Signed: 08/22/2020 12:44:20 PM By: [name] MD Signed: 08/22/2020 5:01:53 PM By: [name] RN, BSN Entered By: [name] on 08/22/2020 10:42:14 -------------------------------------------------------------------------------- HPI Details Patient Name: Date of Service: LEEVI, CULLARS HA M 08/22/2020 9:15 A M Medical Record Number: [id number] Patient Account Number: [id number] Date of Birth/Sex: Treating RN: [birth year] (74 y.o. [name] Primary Care Provider:  [name] Other Clinician: Referring Provider: Treating Provider/Extender: [name] in Treatment: 7 History of Present Illness HPI Description: [name] HPI Description: 74 year old gentleman who was recently seen by his nephrologist [name], and noted to have a wound on his left lower extremity which was lacerated 2 months ago and now has reopened. The patient's left shin has a ulceration with some exudate but no evidence of infection and he was referred to [place] for further care as it  was known that the patient has had some peripheral vascular disease in the past. Past medical history significant for chronic kidney disease, atrial fibrillation, diabetes mellitus,status post kidney transplant in 1983 and 2005, a week fistula graft placement, status post previous bowel surgery. 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 10/06/2015 -- x-ray of the left tibia and fibula shows no evidence of osteomyelitis. The patient has also had Doppler studies of his extremity and is awaiting the appointment with the vascular surgeon. We have not yet received these reports. 10/13/2015 -- lower extremity venous duplex reflux evaluation shows reflux in the left common femoral vein, left saphenofemoral junction and the proximal greater saphenous vein extending to the proximal calf. There is also reflux in the left proximal to mid small saphenous vein. Arterial duplex studies done showed the resting ABI was not applicable due to tibial artery medial calcification. 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 The right TBI was 1 which is normal and the left ABI was 1 which is normal. Patient has otherwise been doing fine and has been compliant with his dressings. 10/20/2015 -- He was seen by [name]  recently for a vascular opinion on 10/15/2015. His left lower extremity venous insufficiency duplex study revealed GSV reflux,SS vein reflux and deep venous reflux in the common femoral vein. His ABIs were non compressible and his TBI on the right was 1.01 and on the left was 0.80. He was asked to continue with the wound care with compressive therapy followed by EVLA of the left GS vein 3 months. He recommended 20-30 mm thigh- high compression stockings and the need for a three-month trial of this. The patient had an Unna boot applied at the vascular office but he could not tolerate this with a lot of pain and issues with his toes and hence came here on Friday for removal of this and we reapplied a 2 layer compression. 11/10/2015 -- patient still has not purchased his 20-30 mm thigh-high compression stockings as prescribed by [name]. Readmission: 08/08/18 on evaluation today patient presents for readmission concerning a new injury to the left anterior lower extremity. He was previously seen in 2017 here in our clinic. He states that he has done fairly well since that point. Nonetheless he is having at this time some pain but states that he hit this on a table that fell over and actually struck his leg. This appears to have pulled back some of his skin which folded in on itself and is causing some difficulty as far as that is concerned. There does not appear to be any evidence of infection at this time. No fevers, chills, nausea, or vomiting noted at this time. He's been using dressings on his own currently without complication. 08/15/18 on evaluation today patient actually appears to be doing somewhat better in regard to his wound of the lower [name] when compared to the first visit last week. I had to do a much more extensive debridement at that time it does appear that I'm gonna have to perform some debridement today but it does not look to be as extensive by any means. Nonetheless fortunately  he does not show any signs of infection he does have discomfort at this site. I believe based on what I'm seeing currently he may benefit from Iodoflex to help keep the  wound bed clean. Patient tolerated therapy without complication. Upon evaluation today the patient actually appears to be doing excellent in regard to his left lower extremity ulcer. This is much better than the previous two visits where he had a lot of necrotic tissue around the edge of the wound simply due to the fact that again there was a significant skin tear where the edge had been cleared away prior to reattaching and being able to heal appropriately. He seems to be doing much better at this point. 08/28/18 on evaluation today the patient's wound actually does appear to be showing signs of improvement. With that being said though he is improving he would likely note even greater improvement if we were able to sharply debride the wound. Nonetheless this caused him to much discomfort he tells me. 09/04/18 on evaluation today patient actually appears to be showing signs of improvement in regard to his left lower extremity ulcer. He has been tolerating the dressing changes including the wrap although he tells me at this point that the burning does last for a couple of days even with just the Iodoflex. I was afraid that this may been part of the issue that he was having with discomfort. It does seem to be the case. Nonetheless he shows no signs of evidence of infection at this time which is good news. No fevers chills noted ADMISSION to [name] 10/05/2018 This is a patient who was cared for in 2017 and again in the fall of this year at our sister clinic and [place]. He actually lives in [place] in [place]. We have been dealing with an apparent traumatic area on the left anterior tibial area. This is been present for the last several months. He was supposed to be using Iodoflex Kerlix and Coban however he was  hospitalized from 09/05/2018 through 09/11/2018 with delirium secondary to pneumonia. Since then he is only been putting Vaseline gauze on this without compression. He also has a more recent skin tear on the dorsal right hand that may have only happened in the last week. The patient had arterial studies done in 2017 in January which was 3 years ago. At that point he had noncompressible ABIs but really quite good TBI's both normal. Triphasic waveforms on the right monophasic at the left posterior tibial but triphasic at the left dorsalis pedis. His ABIs in our clinic today were both noncompressible 1/23; the patient has wounds on the right dorsal hand just distal to the wrist and on the left anterior lower extremity. Both of these look very healthy he is using Hydrofera Blue 1/30; left anterior lower extremity wound much smaller. Healthy looking surface. The laceration area just distal to the wrist on the dorsal hand on the right is also just about closed I used Hydrofera Blue here 2/6; left anterior lower extremity wound is much smaller but still open. The laceration area just distal to the wrist on the dorsal hand is fully epithelialized. 2/13; the patient's anterior lower extremity wound is closed. The laceration just distal to the wrist on the dorsal hand is also fully epithelialized and closed. The patient has external compression stockings which I think are 20/30 READMISSION 08/06/2019 Mr. Ring is a 74 year old man with had several times previously in our clinic. He is a diabetic with a history of chronic renal insufficiency status post kidney transplant in 1983 and again in 2005. He was then in 2017 with a laceration on the left lower extremity. He was worked  up at the time with arterial studies and reflux studies. The arterial studies showed ABIs to be noncompressible but TBI's were within normal limits. I do not have the reflux studies at the moment. He was also sent here in 2019 with a  left lower extremity wound and then again in 2020 with left lower extremity trauma a skin tear on the wrist. He was discharged with 20/30 stockings identified from myself that that might not be enough compression. Nevertheless he states he was wearing these fairly reliably. In September he had a fall with a substantial bruise in the area of the wound. He says he saw orthopedics and they told him there was some muscle strain sometime it later this opened into a wound. He has a fairly substantial wound on the right posterior calf. Satellite areas around this including medially and posteriorly. He has not worn his stockings since the injury Past medical history; includes chronic renal failure secondary to diabetes with kidney transplant x2, atrial fibrillation, heart failure with preserved ejection fraction, coronary artery disease. ABIs on the right in our clinic were once again noncompressible 08/13/2019 on evaluation today patient appears to be doing decently well with regard to his wound compared to last week's evaluation. Unfortunately he is still having a lot of discomfort at this point which is I think in some part due to the 3 layer compression wrap which is a little bit stronger I think for him. When he was here before we actually utilized a Kerlix and Coban wrap which he states seemed to got a little bit better. Nonetheless I think we can probably drop back to this in light of the discomfort that he had. Nonetheless the pain was not really right around the wound itself as much as it was around the ankle in particular. The Iodoflex does seem to have done well for him As the wound is appearing somewhat better today which is excellent news. 11/30; still complaining of a lot of pain. Apparently arterial studies I ordered 2 weeks ago are below. I do not believe we have an appointment with vein and vascular as of yet; ABI Findings: +---------+------------------+-----+----------+--------+ Right Rt  Pressure (mmHg)IndexWaveform Comment  +---------+------------------+-----+----------+--------+ PTA >254 1.50 monophasic  +---------+------------------+-----+----------+--------+ DP >254 1.50 monophasic  +---------+------------------+-----+----------+--------+ Great T [id number] 0.40 Abnormal   +---------+------------------+-----+----------+--------+ +---------+------------------+-----+----------+-------+ Left Lt Pressure (mmHg)IndexWaveform Comment +---------+------------------+-----+----------+-------+ Brachial 169     +---------+------------------+-----+----------+-------+ PTA >254 1.50 monophasic  +---------+------------------+-----+----------+-------+ DP >254 1.50 monophasic  +---------+------------------+-----+----------+-------+ Great T oe40 0.24 Abnormal   +---------+------------------+-----+----------+-------+ Pedal arteries appear hyperemic. Patient refused Brachial pressure in the right arm. Summary: Right: Resting right ankle-brachial index indicates noncompressible right lower extremity arteries. The right toe-brachial index is abnormal. Left: Resting left ankle-brachial index indicates noncompressible left lower extremity arteries. The left toe-brachial index is abnormal. He is also having considerably more swelling in his left calf. This was not there when I last saw him 2 weeks ago. He tells me that some of the home health compression wraps have been slipping down and that may be the issue here however a month I am uncertain 26/7; sees vascular on December 22. Still complaining of a lot of pain. DVT study I did last time was negative for DVT 12/14; still complaining of pain which if this is arterial is certainly claudication and rest keeps him uncomfortable at night. He has an appointment with vein and vascular on December 22. Wound surface is better using Iodoflex. Once the surface of this is satisfactory and we have exhausted  the vascular route. Perhaps an advanced treatment option. He has a configuration of the venous ulceration although his arterial studies are not very good. The other issue is the patient has a transplanted kidney. This will make angiography difficult and challenging issue 12/21; still complaining of pain and drainage. We are using Iodoflex on the wound under compression. He sees Dr. Donnetta Hutching tomorrow to evaluate his noninvasive studies noted above. He has a transplanted kidney further complicating the options for angiography. 12/28; still using Iodoflex under compression. I have Dr. Luther Parody note from 12/22. he noted arterial studies revealing monophasic waveforms at the pedal vessels bilaterally and calcified vessels making the ABI unreliable. He did not comment on the reduced TBI's. He felt these were venous wounds based on the palpable dorsalis pedis pulse. 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 And they arranged for formal venous duplex with reflux studies in the next several weeks. Follow-up with either Scot Dock or Dr. Oneida Alar 09/24/2019 still using Iodoflex under compression. He has an appointment with Dr. Doren Custard on 1/7 with regards to his venous disease. The patient was not felt to have a primary arterial problem for the nonhealing of his wound. We did attempt to wrap him with 3 layer when he first came into the clinic he complained of a lot of pain in the ankle although this may have been because the dressing fell down somewhat. He has far too edema fluid in the right leg for a good prognosis about healing this wound He comes in today with an excoriation on the bottom part of his right fifth toe. He thinks he may have done this taking off his clothes and that something got caught on the toe. There is no open wound however the toe itself is very painful 1/11; we are using Iodoflex under compression. The wound bed is clean. He went on to see Dr. Doren Custard on 09/27/2019 he again feels that the patient's  arterial supply is adequate. He feels that he might benefit from right greater saphenous vein ablation for a venous ulcer. In the meantime the area that he was complaining about last week on the right fifth toe. An x-ray that I ordered showed marked soft tissue swelling along the distal aspect of the right foot but there was no evidence of osteomyelitis. He comes in today with the fifth toenail just about coming off. He has black eschar underneath the toe on the plantar aspect. The toe was swollen red and very painful. In the right setting this could be a significant soft tissue infection versus ischemia of the toe itself. He did not show this to Dr. Doren Custard 1/18; I am using Iodoflex under compression a large wound on the right posterior calf. He had his right greater saphenous vein ablation by Dr. Doren Custard although I am not sure that is the only problem here. The right fifth toe which was possibly trauma 2 weeks ago continues to be exceptionally painful with a necrotic tip. Maybe not quite as swollen. I started him empirically on Augmentin 5/125 1 p.o. twice daily last week after discussing this with Dr. Loletha Grayer of nephrology. Perhaps somewhat better this week but not as good as I was hoping. A plain x-ray was negative. He comes in today with an area on the medial right calf that was blistered and now open. In looking at things he appears to be systemically fluid volume overloaded. He has a transplanted kidney. He states he takes his Lasix variably when he has appointments he tries  not to take it the later I am really not certain if he takes this reliably. However he has far too much edema fluid in the right leg to easily heal this wound and he appears to be developing blisters medially to form additional wounds 1/25; his CO2 angiogram was done by Dr. Doren Custard last week. This showed the proximal arteries all to be patent. On the right side the common femoral deep femoral superficial femoral popliteal anterior  tibial and peroneal arteries were patent. The posterior tibial was occluded but reconstituted distally via collaterals from the peroneal artery he was felt he should have enough blood flow to heal his wounds including the right fifth toe. The right fifth toe looks better however he is still complaining of a lot of pain. The large area which is a venous ulceration posteriorly has a better looking surface I think we can switch to Hydrofera Blue today 2/1; the patient's original wounds on the right posterior medial calf has come down in width however superior to this he has new denuded areas and I am concerned we simply do not have enough edema control. He has already undergone a right greater saphenous ablation. He had a CO2 angiogram done by Dr. Doren Custard and the comment was that we should have enough blood flow to heal the venous wound however we simply do not seem to have enough edema control with 3 layer compression. The patient is status post kidney transplant although his exact renal function is not really clear. Nor am i sure what dose of diuretic he is supposed to be on. He also has the area on the right fifth toe which was unclear etiology but I think became secondarily infected I gave him 2 weeks of antibiotics for this and this seems to have settled down he still has a black eschar over the tip of the toe. X-ray was negative for fracture. He says he has a history of gout. 2/8; the patient's wound on the right posterior calf is about the same. The superficial area medially also about the same. He got a prescription for prednisoneo Gout after he developed erythema on the dorsal aspect of his left great toe going along with the right fifth toe which has been problematic all along. He has not taken it because he is concerned about increasing CBGs. He has a transplanted kidney is already on prednisone 5 mg. He would not be a good candidate for NSAIDs. Perhaps colchicine. He is not aware of what his  uric acid level is 2/15; his right posterior calf wound seems to be coming in in terms of width. Everything here looks fairly good. No mechanical debridement we have been using Hydrofera Blue. He has had an ablation by vein and vascular. Felt to have adequate arterial supply to this area. The patient got prednisone last week from Dr. Angelique Holm of nephrology. At my urging he actually took it. The area on his left toe was a lot better. The right toe was not as painful but still erythematous with a wound at the tip. We have been using silver alginate here 2/22; right posterior calf seems to be gradually epithelializing. Still a fairly substantial wound. He still has an area on the tip of his right fifth toe which I think was gout related. This is gradually improving. We have been using Unna boots to wrap X-ray I did of the foot last time was again negative there was soft tissue irregularity about the distal fifth digit but no radiographic  evidence of bone damage 3/1; right posterior calf seems to be gradually improving however there were areas of hyper granulation. We have been using Hydrofera Blue. The hyper granulation is mostly evident in the most superior finger shape projection of the wound itself. The area on the right fifth toe also was slough covered and required debridement. 3/8 continued improvement in the right posterior calf and the tip of the right fifth toe. We have been using Hydrofera Blue under compression he is changing the area to the toe 3/22; continued improvement in the right posterior calf. Unfortunately comes in today with a new skin tear in the mid anterior tibia area this probably had something to do with changing his dressing home health I called and left this a message last week. 3/29; continued improvement in the a substantial wound on the right posterior calf. The skin tear anteriorly from last week is already healed on the tip of the right fifth toe there is still a  nonviable area. 4/5; we have continued contraction of the substantial wound on the right posterior calf. We continue to have problems with the tip of the right fifth toe. We have been using Hydrofera Blue to both wound areas He is complaining about some discomfort in the Achilles area. T me he had surgery for what sounds like a torn Achilles about 10 years ago. ells 4/12; we have continued contraction of the substantial wound on the right posterior calf. Still an area on the tip of the right fifth toe. I changed him to silver collagen on the toe last week but I think home health continue to use Hydrofera Blue to both wound areas. 4/19; we continue to have contraction of the substantial wound on the right posterior calf however there continues to be hyper granulation. The area on the tip of the fifth toe is very small still some debris on the surface we have been using silver collagen 4/26; both wounds have contracted. I was hoping the fifth toe wound would close over but with probing there is still an open small hole here. 5/3; his wound on the right posterior calf which was his major area continues to contract looks healthy we have been using Hydrofera Blue. Using silver collagen to the fifth toe, not making a lot of progress here 5/10; right posterior calf wound continues to be smaller in terms of surface area and look healthy we have been using Hydrofera Blue here. The tip of the right fifth toe is still open requiring debridement. He has a new laceration on the right forearm he says he hit this on a microwave 6 days ago 02/11/20-The right fifth toe wound is closed, the right posterior calf wound looks healthy with smaller dimensions compared with last time, the right forearm area of laceration has a small blister which I try to open up with scalpel with very little fluid 6/7 the right fifth toe looks fine. Area on the right forearm is also closed. Right posterior calf wound continues to contract.  Much smaller area remaining with healthy surface. We are using Hydrofera Blue under compression 6/21; area on the right posterior calf. There is only 1 small area in the most distal part of this. We have been using Hydrofera Blue under compression this is contracting surface looks healthy 6/28; right posterior calf this is healed. This was initially a hematoma. During the stay in this clinic he had a right greater saphenous vein ablation by Dr. Doren Custard of vein and vascular. He also  had a difficult time with his right fifth toeo Infection versus gout. Very difficult time with this so that this has been closed for about 6 weeks. ------------------- READMISSION 04/29/20-Patient returns to the clinic this time with a right index finger swelling and pain been drainage, patient has been to the hand surgeon and vascular surgeon and ended up having a small IandD attempted in the office with expression of some gritty whitish material, patient also hit his hand and developed a small hematoma around this area. He also had vascular Dopplers done that show monophasic right radial right ulnar waveforms. He has follow-up appointments with vascular surgery as well as the hand surgeon. His appointment today with a hand surgeon he was told that he may lose the tip of his index finger if surgery is done to open up the entire area. He is reluctant to have any surgery and would like to try conservative measures He has tried epsom salt soaks with improvement then worsening , he has just finished a course of antibiotics, he does not remember the name 8/16; patient was readmitted to the clinic last week and I am seeing this for the first time. Previously we had looked at this man for a large wound in the right posterior calf that was initially a hematoma. He went on to have a greater saphenous vein ablation. During the stay here he also had a difficult time with an area over his right fifth toe question infectious question  gout at that time. This eventually closed over although I do not think the differential was ever really totally clear in my mind. The patient is here this time with a necrotic area on the palmar surface of his right index finger. He states this started in July with a small red area. He spent some time going to see Dr. Jarome Matin of dermatology. Ended up with Dr. 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 He is also seen hand surgery which is fortunate is he is likely to require either debridement or amputation of this finger. He had a course of antibiotics Keflex and doxycycline I believe but he is finished these. Currently using Betadine. He describes this is very painful. I have not had a chance to look back over notes in care everywhere. I would like to see as hand surgery notes, vascular surgery notes and dermatology notes if this is possible. He also apparently had an x-ray ordered by Dr. Amedeo Plenty that I would like to review. 6/75; certainly no improvement in his right index finger palmar aspect. Culture I did last time showed abundant Enterobacter and abundant Proteus. I have him on cefdinir 300 mg every 24 as of Friday in response to this. This was a deep tissue culture. 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 He still complaining of a lot of pain. READMISSION 07/04/2020 Mr. Mulvehill is a 75 year old man with wounds on his bilateral lower extremities and bilateral upper extremities which were apparently the consequence of being found down at home in early September. He required admission to the hospital from 9/6 through 9/17 with delirium, hyponatremia, sepsis and type 2 diabetes. He gradually improved. He was sent to a nursing home in [place] and recently has been discharged home. They are using some foam-based dressings on his bilateral lower and upper extremity wounds as well as his left  fifth toe which apparently are felt to be secondary from the prolonged friction and  pressure of being recumbent. We have had Mr. Ruddy in the clinic on 2 different occasions. Predominantly the first time for a large wound on the right posterior calf related to trauma and chronic venous insufficiency. He was here in August with a necrotic wound on the tip of his right index finger which ultimately required amputation this is still currently wrapped. 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 Vanita Panda of nephrology He has not been previously felt to have an arterial issue previous ABIs were noncompressible 10/22; readmitted to the clinic last week. His wounds on the upper extremities are healed as well as the right lower extremity. He still has an area on the left lateral calf and the lateral part of his fifth toe. We have been using silver alginate. He looks like he has had a lot of edema improvement with the recent addition to his diuretics by Dr. Jonn Shingles of nephrology 10/29;. He has 2 wounds on the left lateral calf and the dorsal aspect the left 5th toe nonviable surface. We are using silver alginate 11/5; he did not tolerate Iodoflex. Home health use silver alginate on both the left lateral calf and the left fifth toe. 11/12; the area on the left lateral leg looks better. However the area on the dorsal fifth toe is covered in an adherent black eschar and necrotic debris. We tried Hydrofera Blue the patient could not tolerate Iodoflex however he comes in today with a pack of Iodoflex saying why cannot we use this. I talked to him about this saying I had like to use the Iodoflex especially on the toe and he wanted to try it again after our  discussion. 12/3; left lateral leg looks improved. Still a punched-out area over the PIP of the left fifth toe. We have been using Iodoflex to the toe and Hydrofera Blue the left lateral leg. Electronic Signature(s) Signed: 08/22/2020 12:44:20 PM By: [name] MD Entered By: [name] on 08/22/2020 10:41:39 -------------------------------------------------------------------------------- Physical Exam Details Patient Name: Date of Service: GLADE, STRAUSSER HA M 08/22/2020 9:15 A M Medical Record Number: [id number] Patient Account Number: [id number] Date of Birth/Sex: Treating RN: [birth year] (74 y.o. [name] Primary Care Provider: [name] Other Clinician: Referring Provider: Treating Provider/Extender: [name] in Treatment: 7 Constitutional Sitting or standing Blood Pressure is within target range for patient.. Pulse regular and within target range for patient.Marland Kitchen Respirations regular, non-labored and within target range.. Temperature is normal and within the target range for the patient.Marland Kitchen Appears in no distress. Notes Wound exam Left fifth toe still a punched-out area. Not quite as necrotic is when he was here last time but still requiring a reasonably aggressive subcutaneous debridement. Hemostasis with direct pressure I am able to get to a healthy surface but at some depth there is no palpable bone. No obvious infection Left lateral lower leg. Dry skin and eschar off the wound circumference in the surface of the wound. This cleans up quite nicely. No bleeding Electronic Signature(s) Signed: 08/22/2020 12:44:20 PM By: [name] MD Entered By: [name] on 08/22/2020 10:43:50 -------------------------------------------------------------------------------- Physician Orders Details Patient Name: Date of Service: CHUKWUMA, STRAUS HA M 08/22/2020 9:15 A M Medical Record Number: [id number] Patient Account Number:  [id number] Date of  Birth/Sex: Treating RN: 1945-12-01 (74 y.o. [name] Primary Care Provider: [name] Other Clinician: Referring Provider: Treating Provider/Extender: [name] in Treatment: 7 Verbal / Phone Orders: No Diagnosis Coding ICD-10 Coding Code Description [phone] Non-pressure chronic ulcer of left calf with fat layer exposed 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 of skin E11.622 Type 2 diabetes mellitus with other skin ulcer I87.323 Chronic venous hypertension (idiopathic) with inflammation of bilateral lower extremity Follow-up Appointments Return Appointment in 2 weeks. Bathing/ Shower/ Hygiene May shower with protection but do not get wound dressing(s) wet. Edema Control - Lymphedema / SCD / Other Bilateral Lower Extremities Elevate legs to the level of the heart or above for 30 minutes daily and/or when sitting, a frequency of: Avoid standing for long periods of time. Exercise regularly Moisturize legs daily. Home Health No change in wound care orders this week; continue Home Health for wound care. May utilize formulary equivalent dressing for wound treatment orders unless otherwise specified. Other Home Health Orders/Instructions: - [facility]ound Treatment Wound #12 - Lower Leg Wound Laterality: Left, Lateral Cleanser: Wound Cleanser (Home Health) 2 x Per Week/30 Days Discharge Instructions: Cleanse the wound with wound cleanser prior to applying a clean dressing using gauze sponges, not tissue or cotton balls. Peri-Wound Care: Sween Lotion (Moisturizing lotion) (Home Health) 2 x Per Week/30 Days Discharge Instructions: Apply moisturizing lotion as directed Prim Dressing: Hydrofera Blue Classic Foam, 2x2 in (Home Health) 2 x Per Week/30 Days ary Discharge Instructions: Apply to wound bed as instructed Secondary Dressing: Woven Gauze Sponge, Non-Sterile 4x4 in  (Home Health) 2 x Per Week/30 Days Discharge Instructions: Apply over primary dressing as directed. Compression Wrap: ThreePress (3 layer compression wrap) (Home Health) 2 x Per Week/30 Days Discharge Instructions: Apply three layer compression , unna at top to secure wrap Wound #13 - T Fifth oe Wound Laterality: Left Cleanser: Wound Cleanser (Home Health) 2 x Per Week/30 Days Discharge Instructions: Cleanse the wound with wound cleanser prior to applying a clean dressing using gauze sponges, not tissue or cotton balls. Prim Dressing: IODOFLEX 0.9% Cadexomer Iodine Pad 4x6 cm (Home Health) 2 x Per Week/30 Days ary Discharge Instructions: Apply to wound bed as instructed Secondary Dressing: Woven Gauze Sponges 2x2 in (Home Health) 2 x Per Week/30 Days Discharge Instructions: Apply over primary dressing as directed. Secured With: Child psychotherapist, Sterile 2x75 (in/in) (Home Health) 2 x Per Week/30 Days Discharge Instructions: Secure with stretch gauze as directed. Electronic Signature(s) Signed: 08/22/2020 12:44:20 PM By: [name] MD Signed: 08/22/2020 5:01:53 PM By: [name] RN, BSN Entered By: [name] on 08/22/2020 10:32:13 -------------------------------------------------------------------------------- Problem List Details Patient Name: Date of Service: VAIBHAV, FOGLEMAN HA M 08/22/2020 9:15 A M Medical Record Number: [id number] Patient Account Number: [id number] Date of Birth/Sex: Treating RN: 1945-11-02 (74 y.o. [name] Primary Care Provider: [name] Other Clinician: Referring Provider: Treating Provider/Extender: [name] in Treatment: 7 Active Problems ICD-10 Encounter Code Description Active Date MDM Diagnosis [phone] Non-pressure chronic ulcer of left calf with fat layer exposed 07/04/2020 No Yes 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 of  07/04/2020 No Yes skin E11.622 Type 2 diabetes mellitus with other skin ulcer 07/04/2020 No Yes I87.323 Chronic venous hypertension (idiopathic) with inflammation of bilateral lower 07/04/2020 No Yes extremity Inactive Problems ICD-10 Code Description Active Date Inactive Date L97.211 Non-pressure chronic  ulcer of right calf limited to breakdown of skin 07/04/2020 07/04/2020 S40.812D Abrasion of left upper arm, subsequent encounter 07/04/2020 07/04/2020 S40.811D Abrasion of right upper arm, subsequent encounter 07/04/2020 07/04/2020 Resolved Problems Electronic Signature(s) Signed: 08/22/2020 12:44:20 PM By: [name] MD Entered By: [name] on 08/22/2020 10:40:38 -------------------------------------------------------------------------------- Progress Note Details Patient Name: Date of Service: NIMESH, RIOLO HA M 08/22/2020 9:15 A M Medical Record Number: [id number] Patient Account Number: [id number] Date of Birth/Sex: Treating RN: 01-Feb-1946 (74 y.o. [name] Primary Care Provider: [name] Other Clinician: Referring Provider: Treating Provider/Extender: [name] in Treatment: 7 Subjective History of Present Illness (HPI) [place] HPI Description: 74 year old gentleman who was recently seen by his nephrologist [name], and noted to have a wound on his left lower extremity which was lacerated 2 months ago and now has reopened. The patient's left shin has a ulceration with some exudate but no evidence of infection and he was referred to [place] for further care as it was known that the patient has had some peripheral vascular disease in the past. Past medical history significant for chronic kidney disease, atrial fibrillation, diabetes mellitus,status post kidney transplant in 1983 and 2005, a week fistula graft placement, status post previous bowel surgery. he works as a Presenter, broadcasting and is active and on  his feet for a long while. 10/06/2015 -- x-ray of the left tibia and fibula shows no evidence of osteomyelitis. The patient has also had Doppler studies of his extremity and is awaiting the appointment with the vascular surgeon. We have not yet received these reports. 10/13/2015 -- lower extremity venous duplex reflux evaluation shows reflux in the left common femoral vein, left saphenofemoral junction and the proximal greater saphenous vein extending to the proximal calf. There is also reflux in the left proximal to mid small saphenous vein. Arterial duplex studies done showed the resting ABI was not applicable due to tibial artery medial calcification. 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 The right TBI was 1 which is normal and the left ABI was 1 which is normal. Patient has otherwise been doing fine and has been compliant with his dressings. 10/20/2015 -- He was seen by [name] recently for a vascular opinion on 10/15/2015. His left lower extremity venous insufficiency duplex study revealed GSV reflux,SS vein reflux and deep venous reflux in the common femoral vein. His ABIs were non compressible and his TBI on the right was 1.01 and on the left was 0.80. He was asked to continue with the wound care with compressive therapy followed by EVLA of the left GS vein 3 months. He recommended 20-30 mm thigh- high compression stockings and the need for a three-month trial of this. The patient had an Unna boot applied at the vascular office but he could not tolerate this with a lot of pain and issues with his toes and hence came here on Friday for removal of this and we reapplied a 2 layer compression. 11/10/2015 -- patient still has not purchased his 20-30 mm thigh-high compression stockings as prescribed by [name]. Readmission: 08/08/18 on evaluation today patient  presents for readmission concerning a new injury to the left anterior lower extremity. He was previously seen in 2017 here in our clinic. He states that he has done fairly well since that point. Nonetheless he is having at this time some  pain but states that he hit this on a table that fell over and actually struck his leg. This appears to have pulled back some of his skin which folded in on itself and is causing some difficulty as far as that is concerned. There does not appear to be any evidence of infection at this time. No fevers, chills, nausea, or vomiting noted at this time. He's been using dressings on his own currently without complication. 08/15/18 on evaluation today patient actually appears to be doing somewhat better in regard to his wound of the lower [name] when compared to the first visit last week. I had to do a much more extensive debridement at that time it does appear that I'm gonna have to perform some debridement today but it does not look to be as extensive by any means. Nonetheless fortunately he does not show any signs of infection he does have discomfort at this site. I believe based on what I'm seeing currently he may benefit from Iodoflex to help keep the wound bed clean. Patient tolerated therapy without complication. Upon evaluation today the patient actually appears to be doing excellent in regard to his left lower extremity ulcer. This is much better than the previous two visits where he had a lot of necrotic tissue around the edge of the wound simply due to the fact that again there was a significant skin tear where the edge had been cleared away prior to reattaching and being able to heal appropriately. He seems to be doing much better at this point. 08/28/18 on evaluation today the patient's wound actually does appear to be showing signs of improvement. With that being said though he is improving he would likely note even greater improvement if we were able to sharply  debride the wound. Nonetheless this caused him to much discomfort he tells me. 09/04/18 on evaluation today patient actually appears to be showing signs of improvement in regard to his left lower extremity ulcer. He has been tolerating the dressing changes including the wrap although he tells me at this point that the burning does last for a couple of days even with just the Iodoflex. I was afraid that this may been part of the issue that he was having with discomfort. It does seem to be the case. Nonetheless he shows no signs of evidence of infection at this time which is good news. No fevers chills noted ADMISSION to [name] 10/05/2018 This is a patient who was cared for in 2017 and again in the fall of this year at our sister clinic and [place]. He actually lives in [place] in [place]. We have been dealing with an apparent traumatic area on the left anterior tibial area. This is been present for the last several months. He was supposed to be using Iodoflex Kerlix and Coban however he was hospitalized from 09/05/2018 through 09/11/2018 with delirium secondary to pneumonia. Since then he is only been putting Vaseline gauze on this without compression. He also has a more recent skin tear on the dorsal right hand that may have only happened in the last week. The patient had arterial studies done in 2017 in January which was 3 years ago. At that point he had noncompressible ABIs but really quite good TBI's both normal. Triphasic waveforms on the right monophasic at the left posterior tibial but triphasic at the left dorsalis pedis. His ABIs in our clinic today were both noncompressible 1/23; the patient has wounds on the right dorsal hand  just distal to the wrist and on the left anterior lower extremity. Both of these look very healthy he is using Hydrofera Blue 1/30; left anterior lower extremity wound much smaller. Healthy looking surface. The laceration area just distal to  the wrist on the dorsal hand on the right is also just about closed I used Hydrofera Blue here 2/6; left anterior lower extremity wound is much smaller but still open. The laceration area just distal to the wrist on the dorsal hand is fully epithelialized. 2/13; the patient's anterior lower extremity wound is closed. The laceration just distal to the wrist on the dorsal hand is also fully epithelialized and closed. The patient has external compression stockings which I think are 20/30 READMISSION 08/06/2019 Mr. Muscarella is a 74 year old man with had several times previously in our clinic. He is a diabetic with a history of chronic renal insufficiency status post kidney transplant in 1983 and again in 2005. He was then in 2017 with a laceration on the left lower extremity. He was worked up at the time with arterial studies and reflux studies. The arterial studies showed ABIs to be noncompressible but TBI's were within normal limits. I do not have the reflux studies at the moment. He was also sent here in 2019 with a left lower extremity wound and then again in 2020 with left lower extremity trauma a skin tear on the wrist. He was discharged with 20/30 stockings identified from myself that that might not be enough compression. Nevertheless he states he was wearing these fairly reliably. In September he had a fall with a substantial bruise in the area of the wound. He says he saw orthopedics and they told him there was some muscle strain sometime it later this opened into a wound. He has a fairly substantial wound on the right posterior calf. Satellite areas around this including medially and posteriorly. He has not worn his stockings since the injury Past medical history; includes chronic renal failure secondary to diabetes with kidney transplant x2, atrial fibrillation, heart failure with preserved ejection fraction, coronary artery disease. ABIs on the right in our clinic were once again  noncompressible 08/13/2019 on evaluation today patient appears to be doing decently well with regard to his wound compared to last week's evaluation. Unfortunately he is still having a lot of discomfort at this point which is I think in some part due to the 3 layer compression wrap which is a little bit stronger I think for him. When he was here before we actually utilized a Kerlix and Coban wrap which he states seemed to got a little bit better. Nonetheless I think we can probably drop back to this in light of the discomfort that he had. Nonetheless the pain was not really right around the wound itself as much as it was around the ankle in particular. The Iodoflex does seem to have done well for him As the wound is appearing somewhat better today which is excellent news. 11/30; still complaining of a lot of pain. Apparently arterial studies I ordered 2 weeks ago are below. I do not believe we have an appointment with vein and vascular as of yet; ABI Findings: +---------+------------------+-----+----------+--------+ Right Rt Pressure (mmHg)IndexWaveform Comment  +---------+------------------+-----+----------+--------+ PTA >254 1.50 monophasic  +---------+------------------+-----+----------+--------+ DP >254 1.50 monophasic  +---------+------------------+-----+----------+--------+ Great T [id number] 0.40 Abnormal   +---------+------------------+-----+----------+--------+ +---------+------------------+-----+----------+-------+ Left Lt Pressure (mmHg)IndexWaveform Comment +---------+------------------+-----+----------+-------+ Brachial 169     +---------+------------------+-----+----------+-------+ PTA >254 1.50 monophasic  +---------+------------------+-----+----------+-------+ DP >254 1.50 monophasic  +---------+------------------+-----+----------+-------+ Doristine Devoid T  [id number] 0.24 Abnormal    +---------+------------------+-----+----------+-------+ Pedal arteries appear hyperemic. Patient refused Brachial pressure in the right arm. Summary: Right: Resting right ankle-brachial index indicates noncompressible right lower extremity arteries. The right toe-brachial index is abnormal. Left: Resting left ankle-brachial index indicates noncompressible left lower extremity arteries. The left toe-brachial index is abnormal. He is also having considerably more swelling in his left calf. This was not there when I last saw him 2 weeks ago. He tells me that some of the home health compression wraps have been slipping down and that may be the issue here however a month I am uncertain 44/9; sees vascular on December 22. Still complaining of a lot of pain. DVT study I did last time was negative for DVT 12/14; still complaining of pain which if this is arterial is certainly claudication and rest keeps him uncomfortable at night. He has an appointment with vein and vascular on December 22. Wound surface is better using Iodoflex. Once the surface of this is satisfactory and we have exhausted the vascular route. Perhaps an advanced treatment option. He has a configuration of the venous ulceration although his arterial studies are not very good. The other issue is the patient has a transplanted kidney. This will make angiography difficult and challenging issue 12/21; still complaining of pain and drainage. We are using Iodoflex on the wound under compression. He sees Dr. Donnetta Hutching tomorrow to evaluate his noninvasive studies noted above. He has a transplanted kidney further complicating the options for angiography. 12/28; still using Iodoflex under compression. I have Dr. Luther Parody note from 12/22. he noted arterial studies revealing monophasic waveforms at the pedal vessels bilaterally and calcified vessels making the ABI unreliable. He did not comment on the reduced TBI's. He felt these were venous wounds based  on the palpable dorsalis pedis pulse. 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 And they arranged for formal venous duplex with reflux studies in the next several weeks. Follow-up with either Scot Dock or Dr. Oneida Alar 09/24/2019 still using Iodoflex under compression. He has an appointment with Dr. Doren Custard on 1/7 with regards to his venous disease. The patient was not felt to have a primary arterial problem for the nonhealing of his wound. We did attempt to wrap him with 3 layer when he first came into the clinic he complained of a lot of pain in the ankle although this may have been because the dressing fell down somewhat. He has far too edema fluid in the right leg for a good prognosis about healing this wound He comes in today with an excoriation on the bottom part of his right fifth toe. He thinks he may have done this taking off his clothes and that something got caught on the toe. There is no open wound however the toe itself is very painful 1/11; we are using Iodoflex under compression. The wound bed is clean. He went on to see Dr. Doren Custard on 09/27/2019 he again feels that the patient's arterial supply is adequate. He feels that he might benefit from right greater saphenous vein ablation for a venous ulcer. In the meantime the area that he was complaining about last week on the right fifth toe. An x-ray that I ordered showed marked soft tissue swelling along the distal aspect of the right foot but there was no evidence of osteomyelitis. He comes in today with the fifth toenail just about coming off. He has black eschar underneath the toe on the plantar aspect. The toe was swollen red and very  painful. In the right setting this could be a significant soft tissue infection versus ischemia of the toe itself. He did not show this to Dr. Doren Custard 1/18; I am using Iodoflex under compression a large wound on the right posterior calf. He had his right greater saphenous vein ablation by Dr. Doren Custard although I am  not sure that is the only problem here. ooThe right fifth toe which was possibly trauma 2 weeks ago continues to be exceptionally painful with a necrotic tip. Maybe not quite as swollen. I started him empirically on Augmentin 5/125 1 p.o. twice daily last week after discussing this with Dr. Loletha Grayer of nephrology. Perhaps somewhat better this week but not as good as I was hoping. A plain x-ray was negative. He comes in today with an area on the medial right calf that was blistered and now open. In looking at things he appears to be systemically fluid volume overloaded. He has a transplanted kidney. He states he takes his Lasix variably when he has appointments he tries not to take it the later I am really not certain if he takes this reliably. However he has far too much edema fluid in the right leg to easily heal this wound and he appears to be developing blisters medially to form additional wounds 1/25; his CO2 angiogram was done by Dr. Doren Custard last week. This showed the proximal arteries all to be patent. On the right side the common femoral deep femoral superficial femoral popliteal anterior tibial and peroneal arteries were patent. The posterior tibial was occluded but reconstituted distally via collaterals from the peroneal artery he was felt he should have enough blood flow to heal his wounds including the right fifth toe. The right fifth toe looks better however he is still complaining of a lot of pain. The large area which is a venous ulceration posteriorly has a better looking surface I think we can switch to Hydrofera Blue today 2/1; the patient's original wounds on the right posterior medial calf has come down in width however superior to this he has new denuded areas and I am concerned we simply do not have enough edema control. He has already undergone a right greater saphenous ablation. He had a CO2 angiogram done by Dr. Doren Custard and the comment was that we should have enough blood flow to heal  the venous wound however we simply do not seem to have enough edema control with 3 layer compression. The patient is status post kidney transplant although his exact renal function is not really clear. Nor am i sure what dose of diuretic he is supposed to be on. He also has the area on the right fifth toe which was unclear etiology but I think became secondarily infected I gave him 2 weeks of antibiotics for this and this seems to have settled down he still has a black eschar over the tip of the toe. X-ray was negative for fracture. He says he has a history of gout. 2/8; the patient's wound on the right posterior calf is about the same. The superficial area medially also about the same. He got a prescription for prednisoneo Gout after he developed erythema on the dorsal aspect of his left great toe going along with the right fifth toe which has been problematic all along. He has not taken it because he is concerned about increasing CBGs. He has a transplanted kidney is already on prednisone 5 mg. He would not be a good candidate for NSAIDs. Perhaps colchicine. He is  not aware of what his uric acid level is 2/15; his right posterior calf wound seems to be coming in in terms of width. Everything here looks fairly good. No mechanical debridement we have been using Hydrofera Blue. He has had an ablation by vein and vascular. Felt to have adequate arterial supply to this area. The patient got prednisone last week from Dr. Angelique Holm of nephrology. At my urging he actually took it. The area on his left toe was a lot better. The right toe was not as painful but still erythematous with a wound at the tip. We have been using silver alginate here 2/22; right posterior calf seems to be gradually epithelializing. Still a fairly substantial wound. He still has an area on the tip of his right fifth toe which I think was gout related. This is gradually improving. We have been using Unna boots to wrap X-ray I did of  the foot last time was again negative there was soft tissue irregularity about the distal fifth digit but no radiographic evidence of bone damage 3/1; right posterior calf seems to be gradually improving however there were areas of hyper granulation. We have been using Hydrofera Blue. The hyper granulation is mostly evident in the most superior finger shape projection of the wound itself. ooThe area on the right fifth toe also was slough covered and required debridement. 3/8 continued improvement in the right posterior calf and the tip of the right fifth toe. We have been using Hydrofera Blue under compression he is changing the area to the toe 3/22; continued improvement in the right posterior calf. Unfortunately comes in today with a new skin tear in the mid anterior tibia area this probably had something to do with changing his dressing home health I called and left this a message last week. 3/29; continued improvement in the a substantial wound on the right posterior calf. The skin tear anteriorly from last week is already healed on the tip of the right fifth toe there is still a nonviable area. 4/5; we have continued contraction of the substantial wound on the right posterior calf. We continue to have problems with the tip of the right fifth toe. We have been using Hydrofera Blue to both wound areas He is complaining about some discomfort in the Achilles area. T me he had surgery for what sounds like a torn Achilles about 10 years ago. ells 4/12; we have continued contraction of the substantial wound on the right posterior calf. ooStill an area on the tip of the right fifth toe. I changed him to silver collagen on the toe last week but I think home health continue to use Hydrofera Blue to both wound areas. 4/19; we continue to have contraction of the substantial wound on the right posterior calf however there continues to be hyper granulation. ooThe area on the tip of the fifth toe is very  small still some debris on the surface we have been using silver collagen 4/26; both wounds have contracted. I was hoping the fifth toe wound would close over but with probing there is still an open small hole here. 5/3; his wound on the right posterior calf which was his major area continues to contract looks healthy we have been using Hydrofera Blue. Using silver collagen to the fifth toe, not making a lot of progress here 5/10; right posterior calf wound continues to be smaller in terms of surface area and look healthy we have been using Hydrofera Blue here. The tip of the  right fifth toe is still open requiring debridement. He has a new laceration on the right forearm he says he hit this on a microwave 6 days ago 02/11/20-The right fifth toe wound is closed, the right posterior calf wound looks healthy with smaller dimensions compared with last time, the right forearm area of laceration has a small blister which I try to open up with scalpel with very little fluid 6/7 the right fifth toe looks fine. Area on the right forearm is also closed. Right posterior calf wound continues to contract. Much smaller area remaining with healthy surface. We are using Hydrofera Blue under compression 6/21; area on the right posterior calf. There is only 1 small area in the most distal part of this. We have been using Hydrofera Blue under compression this is contracting surface looks healthy 6/28; right posterior calf this is healed. This was initially a hematoma. During the stay in this clinic he had a right greater saphenous vein ablation by Dr. Doren Custard of vein and vascular. He also had a difficult time with his right fifth toeo Infection versus gout. Very difficult time with this so that this has been closed for about 6 weeks. ------------------- READMISSION 04/29/20-Patient returns to the clinic this time with a right index finger swelling and pain been drainage, patient has been to the hand surgeon and  vascular surgeon and ended up having a small IandD attempted in the office with expression of some gritty whitish material, patient also hit his hand and developed a small hematoma around this area. He also had vascular Dopplers done that show monophasic right radial right ulnar waveforms. He has follow-up appointments with vascular surgery as well as the hand surgeon. His appointment today with a hand surgeon he was told that he may lose the tip of his index finger if surgery is done to open up the entire area. He is reluctant to have any surgery and would like to try conservative measures He has tried epsom salt soaks with improvement then worsening , he has just finished a course of antibiotics, he does not remember the name 8/16; patient was readmitted to the clinic last week and I am seeing this for the first time. Previously we had looked at this man for a large wound in the right posterior calf that was initially a hematoma. He went on to have a greater saphenous vein ablation. During the stay here he also had a difficult time with an area over his right fifth toe question infectious question gout at that time. This eventually closed over although I do not think the differential was ever really totally clear in my mind. The patient is here this time with a necrotic area on the palmar surface of his right index finger. He states this started in July with a small red area. He spent some time going to see Dr. Jarome Matin of dermatology. Ended up with Dr. 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 He is also seen hand surgery which is fortunate is he is likely to require either debridement or amputation of this finger. He had a course of antibiotics Keflex and doxycycline I believe but he is finished these. Currently using Betadine. He describes this is very painful. I have not had a chance to look back over notes in care everywhere. I would like to  see as hand surgery notes, vascular surgery notes and dermatology notes if this is possible. He also apparently had an x-ray ordered  by Dr. Amedeo Plenty that I would like to review. 5/91; certainly no improvement in his right index finger palmar aspect. Culture I did last time showed abundant Enterobacter and abundant Proteus. I have him on cefdinir 300 mg every 24 as of Friday in response to this. This was a deep tissue culture. 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 He still complaining of a lot of pain. READMISSION 07/04/2020 Mr. Earnest is a 74 year old man with wounds on his bilateral lower extremities and bilateral upper extremities which were apparently the consequence of being found down at home in early September. He required admission to the hospital from 9/6 through 9/17 with delirium, hyponatremia, sepsis and type 2 diabetes. He gradually improved. He was sent to a nursing home in [place] and recently has been discharged home. They are using some foam-based dressings on his bilateral lower and upper extremity wounds as well as his left fifth toe which apparently are felt to be secondary from the prolonged friction and pressure of being recumbent. We have had Mr. Woolbright in the clinic on 2 different occasions. Predominantly the first time for a large wound on the right posterior calf related to trauma and chronic venous insufficiency. He was here in August with a necrotic wound on the tip of his right index finger which ultimately required amputation this is still currently wrapped. 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 Vanita Panda of nephrology He has not been previously felt  to have an arterial issue previous ABIs were noncompressible 10/22; readmitted to the clinic last week. His wounds on the upper extremities are healed as well as the right lower extremity. He still has an area on the left lateral calf and the lateral part of his fifth toe. We have been using silver alginate. He looks like he has had a lot of edema improvement with the recent addition to his diuretics by Dr. Jonn Shingles of nephrology 10/29;. He has 2 wounds on the left lateral calf and the dorsal aspect the left 5th toe nonviable surface. We are using silver alginate 11/5; he did not tolerate Iodoflex. Home health use silver alginate on both the left lateral calf and the left fifth toe. 11/12; the area on the left lateral leg looks better. However the area on the dorsal fifth toe is covered in an adherent black eschar and necrotic debris. We tried Hydrofera Blue the patient could not tolerate Iodoflex however he comes in today with a pack of Iodoflex saying why cannot we use this. I talked to him about this saying I had like to use the Iodoflex especially on the toe and he wanted to try it again after our discussion. 12/3; left lateral leg looks improved. Still a punched-out area over the PIP of the left fifth toe. We have been using Iodoflex to the toe and Hydrofera Blue the left lateral leg. Objective Constitutional Sitting or standing Blood Pressure is within target range for patient.. Pulse regular and within target range for patient.Marland Kitchen Respirations regular, non-labored and within target range.. Temperature is normal and within the target range for the patient.Marland Kitchen Appears in no distress. Vitals Time Taken: 9:41 AM, Height: 68 in, Weight: 192 lbs, BMI: 29.2, Temperature: 98.5 F, Pulse: 69 bpm, Respiratory Rate: 18 breaths/min,  Blood Pressure: 141/63 mmHg. General Notes: Wound exam ooLeft fifth toe still a punched-out area. Not quite as necrotic is when he was here last time but still requiring a  reasonably aggressive subcutaneous debridement. Hemostasis with direct pressure I am able to get to a healthy surface but at some depth there is no palpable bone. No obvious infection ooLeft lateral lower leg. Dry skin and eschar off the wound circumference in the surface of the wound. This cleans up quite nicely. No bleeding Integumentary (Hair, Skin) Wound #12 status is Open. Original cause of wound was Trauma. The wound is located on the Left,Lateral Lower Leg. The wound measures 0.9cm length x 0.2cm width x 0.1cm depth; 0.141cm^2 area and 0.014cm^3 volume. There is Fat Layer (Subcutaneous Tissue) exposed. There is no tunneling or undermining noted. There is a medium amount of serosanguineous drainage noted. The wound margin is distinct with the outline attached to the wound base. There is large (67-100%) pink granulation within the wound bed. There is no necrotic tissue within the wound bed. Wound #13 status is Open. Original cause of wound was Gradually Appeared. The wound is located on the Left T Fifth. The wound measures 1cm length x oe 0.4cm width x 0.3cm depth; 0.314cm^2 area and 0.094cm^3 volume. There is no tunneling or undermining noted. There is a none present amount of drainage noted. The wound margin is distinct with the outline attached to the wound base. There is no granulation within the wound bed. There is a large (67-100%) amount of necrotic tissue within the wound bed including Eschar and Adherent Slough. 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 There was a Selective/Open Wound Skin/Epidermis Debridement with a total area of 0.18 sq cm performed by Ricard Dillon., MD. With the following instrument(s): Curette to remove Non-Viable tissue/material. Material removed includes Skin: Epidermis and Fibrin/Exudate and after achieving pain control using Lidocaine 5% topical ointment. No specimens were taken. A time out was conducted at 10:18, prior to the start of the procedure. A Minimum amount of bleeding was controlled with Pressure. The procedure was tolerated well with a pain level of 2 throughout and a pain level of 1 following the procedure. Post Debridement Measurements: 0.9cm length x 0.2cm width x 0.1cm depth; 0.014cm^3 volume. Character of Wound/Ulcer Post Debridement is improved. Severity of Tissue Post Debridement is: Fat layer exposed. 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 There was a Three Layer Compression Therapy Procedure by [name], RN. Post procedure Diagnosis Wound #12: Same as Pre-Procedure Wound #13 Pre-procedure diagnosis of Wound #13 is a Diabetic Wound/Ulcer of the Lower Extremity located on the Left T Fifth .Severity of Tissue Pre Debridement is: oe Fat layer exposed. There was a Excisional Skin/Subcutaneous Tissue Debridement with a total area of 0.4 sq cm performed by Ricard Dillon., MD. With the following instrument(s): Curette to remove Non-Viable tissue/material. Material removed includes Subcutaneous Tissue and Slough and after achieving pain control using Lidocaine 5% topical ointment.  No specimens were taken. A time out was conducted at 10:18, prior to the start of the procedure. A Minimum amount of bleeding was controlled with Pressure. The procedure was tolerated well with a pain level of 2 throughout and a  pain level of 1 following the procedure. Post Debridement Measurements: 1cm length x 0.4cm width x 0.3cm depth; 0.094cm^3 volume. Character of Wound/Ulcer Post Debridement is improved. Severity of Tissue Post Debridement is: Fat layer exposed. Post procedure Diagnosis Wound #13: Same as Pre-Procedure Plan Follow-up Appointments: Return Appointment in 2 weeks. Bathing/ Shower/ Hygiene: May shower with protection but do not get wound dressing(s) wet. Edema Control - Lymphedema / SCD / Other: Elevate legs to the level of the heart or above for 30 minutes daily and/or when sitting, a frequency of: Avoid standing for long periods of time. Exercise regularly Moisturize legs daily. Home Health: No change in wound care orders this week; continue Home Health for wound care. May utilize formulary equivalent dressing for wound treatment orders unless otherwise specified. Other Home Health Orders/Instructions: - [facility] WOUND #12: - Lower Leg Wound Laterality: Left, Lateral Cleanser: Wound Cleanser (Home Health) 2 x Per Week/30 Days Discharge Instructions: Cleanse the wound with wound cleanser prior to applying a clean dressing using gauze sponges, not tissue or cotton balls. Peri-Wound Care: Sween Lotion (Moisturizing lotion) (Home Health) 2 x Per Week/30 Days Discharge Instructions: Apply moisturizing lotion as directed Prim Dressing: Hydrofera Blue Classic Foam, 2x2 in (Home Health) 2 x Per Week/30 Days ary Discharge Instructions: Apply to wound bed as instructed Secondary Dressing: Woven Gauze Sponge, Non-Sterile 4x4 in (Home Health) 2 x Per Week/30 Days Discharge Instructions: Apply over primary dressing as directed. Com pression Wrap: ThreePress (3 layer compression wrap) (Home Health) 2 x Per Week/30 Days Discharge Instructions: Apply three layer compression , unna at top to secure wrap WOUND #13: - T Fifth Wound Laterality: Left oe Cleanser: Wound Cleanser (Home Health) 2 x Per Week/30  Days Discharge Instructions: Cleanse the wound with wound cleanser prior to applying a clean dressing using gauze sponges, not tissue or cotton balls. Prim Dressing: IODOFLEX 0.9% Cadexomer Iodine Pad 4x6 cm (Home Health) 2 x Per Week/30 Days ary Discharge Instructions: Apply to wound bed as instructed Secondary Dressing: Woven Gauze Sponges 2x2 in (Home Health) 2 x Per Week/30 Days Discharge Instructions: Apply over primary dressing as directed. Secured With: Child psychotherapist, Sterile 2x75 (in/in) (Home Health) 2 x Per Week/30 Days Discharge Instructions: Secure with stretch gauze as directed. 1. I am continue with Hydrofera Blue to the left lateral lower leg and Iodoflex to the toe. 2. We have made some improvement 3. Notable that the patient said that he went to see his doctor yesterday for intense erythema and pain in the inner phalangeal joint of the left great toe felt to have gout which the patient does have. He was ordered medication but his drugstore is telling him a $200. I really did not look into this. This does not look to be an infection and there is no associated wound Electronic Signature(s) Signed: 08/22/2020 12:44:20 PM By: [name] MD Entered By: [name] on 08/22/2020 10:45:00 -------------------------------------------------------------------------------- SuperBill Details Patient Name: Date of Service: CHANSE, KAGEL HA M 08/22/2020 Medical Record Number: [id number] Patient Account Number: [id number] Date of Birth/Sex: Treating RN: July 19, 1946 (74 y.o. 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 Facility Procedures CPT4 Code: [id number] Description: 28979 - DEB SUBQ TISSUE 20 SQ CM/< ICD-10 Diagnosis Description 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 of sk Modifier: 25 in Quantity: 1 CPT4 Code: [id number] Description: 38377 - DEBRIDE WOUND 1ST 20 SQ CM OR < ICD-10 Diagnosis Description 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

## 2020-08-22 NOTE — Progress Notes (Signed)
[name] ([id number]) Visit Report for 08/22/2020 Arrival Information Details Patient Name: Date of Service: [name], [name] 08/22/2020 9:15 [name] Medical Record Number: [id number] Patient Account Number: [id number] Date of Birth/Sex: Treating RN: Apr 18, 1946 (74 y.o. [name], Meta.Reding Primary Care Sherian Valenza: [name] Other Clinician: Referring Rosabel Sermeno: Treating Brennen Gardiner/Extender: [name] in Treatment: 7 Visit Information History Since Last Visit Added or deleted any medications: No Patient Arrived: Ambulatory Any new allergies or adverse reactions: No Arrival Time: 09:40 Had a fall or experienced change in No Accompanied By: self activities of daily living that may affect Transfer Assistance: None risk of falls: Patient Identification Verified: Yes Signs or symptoms of abuse/neglect since last visito No Secondary Verification Process Completed: Yes Hospitalized since last visit: No Patient Requires Transmission-Based Precautions: No Implantable device outside of the clinic excluding No Patient Has Alerts: No cellular tissue based products placed in the center since last visit: Has Dressing in Place as Prescribed: Yes Has Compression in Place as Prescribed: Yes Pain Present Now: No Electronic Signature(s) Signed: 08/22/2020 5:19:29 PM By: [name] Entered By: [name] on 08/22/2020 09:49:42 -------------------------------------------------------------------------------- Compression Therapy Details Patient Name: Date of Service: [name] 08/22/2020 9:15 [name] Medical Record Number: [id number] Patient Account Number: [id number] Date of Birth/Sex: Treating RN: 1945-10-25 (74 y.o. [name] Primary Care Tatelyn Vanhecke: [name] Other Clinician: Referring Shenetta Schnackenberg: Treating Kip Kautzman/Extender: [name] in Treatment: 7 Compression Therapy Performed for Wound  Assessment: Wound #12 Left,Lateral Lower Leg Performed By: Clinician [name], RN Compression Type: Three Layer Post Procedure Diagnosis Same as Pre-procedure Electronic Signature(s) Signed: 08/22/2020 5:01:53 PM By: [name] RN, BSN Entered By: [name] on 08/22/2020 10:23:32 -------------------------------------------------------------------------------- Encounter Discharge Information Details Patient Name: Date of Service: [name] 08/22/2020 9:15 [name] Medical Record Number: [id number] Patient Account Number: [id number] Date of Birth/Sex: Treating RN: 1945/10/09 (74 y.o. [name] Primary Care Daryus Sowash: [name] Other Clinician: Referring Imane Burrough: Treating Lee Kalt/Extender: [name] in Treatment: 7 Encounter Discharge Information Items Post Procedure Vitals Discharge Condition: Stable Temperature (F): 98.5 Ambulatory Status: Ambulatory Pulse (bpm): 69 Discharge Destination: Home Respiratory Rate (breaths/min): 18 Transportation: Private Auto Blood Pressure (mmHg): 141/63 Accompanied By: self Schedule Follow-up Appointment: Yes Clinical Summary of Care: Electronic Signature(s) Signed: 08/22/2020 5:19:29 PM By: [name] Entered By: [name] on 08/22/2020 10:41:53 -------------------------------------------------------------------------------- Lower Extremity Assessment Details Patient Name: Date of Service: [name] 08/22/2020 9:15 [name] Medical Record Number: [id number] Patient Account Number: [id number] Date of Birth/Sex: Treating RN: 1946/03/19 (74 y.o. [name] Primary Care Ted Leonhart: [name] Other Clinician: Referring Wiktoria Hemrick: Treating Winifred Balogh/Extender: [name] in Treatment: 7 Edema Assessment Assessed: Shirlyn Goltz: Yes] [Right: No] Edema: [Left: Ye] [Right: s] Calf Left: Right: Point of Measurement: 42 cm  From Medial Instep 42.5 cm Ankle Left: Right: Point of Measurement: 18 cm From Medial Instep 23 cm Vascular Assessment Pulses: Dorsalis Pedis Palpable: [Left:Yes] Electronic Signature(s) Signed: 08/22/2020 5:19:29 PM By: [name] Entered By: [name] on 08/22/2020 09:50:26 -------------------------------------------------------------------------------- Multi Wound Chart Details Patient Name: Date of Service: [name] 08/22/2020 9:15 [name] Medical Record Number: [id number] Patient Account Number: [id number] Date of Birth/Sex: Treating RN: 27-Sep-1945 (74 y.o. [name] Primary Care Alixandria Friedt: [name] Other Clinician: Referring Floye Fesler: Treating Ravi Tuccillo/Extender: [name] in Treatment:  7 Vital Signs Height(in): 68 Pulse(bpm): 69 Weight(lbs): 192 Blood Pressure(mmHg): 141/63 Body Mass Index(BMI): 29 Temperature(F): 98.5 Respiratory Rate(breaths/min): 18 Photos: [12:No Photos Left, Lateral Lower Leg] [13:No Photos Left T Fifth oe] [N/A:N/A N/A] Wound Location: [12:Trauma] [13:Gradually Appeared] [N/A:N/A] Wounding Event: [12:Diabetic Wound/Ulcer of the Lower] [13:Diabetic Wound/Ulcer of the Lower] [N/A:N/A] Primary Etiology: [12:Extremity Cataracts, Anemia, Arrhythmia,] [13:Extremity Cataracts, Anemia, Arrhythmia,] [N/A:N/A] Comorbid History: [12:Congestive Heart Failure, Hypertension, Type II Diabetes, Gout, Hypertension, Type II Diabetes, Gout, Neuropathy 05/26/2020] [13:Congestive Heart Failure, Neuropathy 06/18/2020] [N/A:N/A] Date Acquired: [12:7] [13:7] [N/A:N/A] Weeks of Treatment: [12:Open] [13:Open] [N/A:N/A] Wound Status: [12:0.9x0.2x0.1] [13:1x0.4x0.3] [N/A:N/A] Measurements L x W x D (cm) [12:0.141] [13:0.314] [N/A:N/A] A (cm) : rea [12:0.014] [13:0.094] [N/A:N/A] Volume (cm) : [12:95.90%] [13:44.40%] [N/A:N/A] % Reduction in A [12:rea: 99.00%] [13:-64.90%] [N/A:N/A] % Reduction in Volume:  [12:Grade 2] [13:Grade 2] [N/A:N/A] Classification: [12:Medium] [13:None Present] [N/A:N/A] Exudate A mount: [12:Serosanguineous] [13:N/A] [N/A:N/A] Exudate Type: [12:red, brown] [13:N/A] [N/A:N/A] Exudate Color: [12:Distinct, outline attached] [13:Distinct, outline attached] [N/A:N/A] Wound Margin: [12:Large (67-100%)] [13:None Present (0%)] [N/A:N/A] Granulation A mount: [12:Pink] [13:N/A] [N/A:N/A] Granulation Quality: [12:None Present (0%)] [13:Large (67-100%)] [N/A:N/A] Necrotic A mount: [12:N/A] [13:Eschar, Adherent Slough] [N/A:N/A] Necrotic Tissue: [12:Fat Layer (Subcutaneous Tissue): Yes Fascia: No] [N/A:N/A] Exposed Structures: [12:Fascia: No Tendon: No Muscle: No Joint: No Bone: No Large (67-100%)] [13:Fat Layer (Subcutaneous Tissue): No Tendon: No Muscle: No Joint: No Bone: No None] [N/A:N/A] Epithelialization: [12:Debridement - Selective/Open Wound Debridement - Excisional] [N/A:N/A] Debridement: Pre-procedure Verification/Time Out 10:18 [13:10:18] [N/A:N/A] Taken: [12:Lidocaine 5% topical ointment] [13:Lidocaine 5% topical ointment] [N/A:N/A] Pain Control: [12:N/A] [13:Subcutaneous, Slough] [N/A:N/A] Tissue Debrided: [12:Skin/Epidermis] [13:Skin/Subcutaneous Tissue] [N/A:N/A] Level: [12:0.18] [13:0.4] [N/A:N/A] Debridement A (sq cm): [12:rea Curette] [13:Curette] [N/A:N/A] Instrument: [12:Minimum] [13:Minimum] [N/A:N/A] Bleeding: [12:Pressure] [13:Pressure] [N/A:N/A] Hemostasis A chieved: [12:2] [13:2] [N/A:N/A] Procedural Pain: [12:1] [13:1] [N/A:N/A] Post Procedural Pain: [12:Procedure was tolerated well] [13:Procedure was tolerated well] [N/A:N/A] Debridement Treatment Response: [12:0.9x0.2x0.1] [13:1x0.4x0.3] [N/A:N/A] Post Debridement Measurements L x W x D (cm) [12:0.014] [13:0.094] [N/A:N/A] Post Debridement Volume: (cm) [12:Compression Therapy] [13:Debridement] [N/A:N/A] Procedures Performed: [12:Debridement] Treatment Notes Electronic Signature(s) Signed:  08/22/2020 12:44:20 PM By: [name] MD Signed: 08/22/2020 5:01:53 PM By: [name] RN, BSN Entered By: [name] on 08/22/2020 10:40:46 -------------------------------------------------------------------------------- Multi-Disciplinary Care Plan Details Patient Name: Date of Service: KARIS, EMIG HA [name] 08/22/2020 9:15 [name] Medical Record Number: [id number] Patient Account Number: [id number] Date of Birth/Sex: Treating RN: 01-05-1946 (74 y.o. [name] Primary Care Jamesetta Greenhalgh: [name] Other Clinician: Referring Baer Hinton: Treating Dmonte Maher/Extender: [name] in Treatment: 7 Active Inactive Abuse / Safety / Falls / Self Care Management Nursing Diagnoses: Potential for falls Goals: Patient/caregiver will verbalize/demonstrate measures taken to prevent injury and/or falls Date Initiated: 07/04/2020 Target Resolution Date: 09/05/2020 Goal Status: Active Interventions: Assess fall risk on admission and as needed Assess impairment of mobility on admission and as needed per policy Notes: Nutrition Nursing Diagnoses: Impaired glucose control: actual or potential Potential for alteratiion in Nutrition/Potential for imbalanced nutrition Goals: Patient/caregiver will maintain therapeutic glucose control Date Initiated: 07/04/2020 Target Resolution Date: 09/05/2020 Goal Status: Active Interventions: Assess HgA1c results as ordered upon admission and as needed Assess patient nutrition upon admission and as needed per policy Treatment Activities: Patient referred to Primary Care Physician for further nutritional evaluation : 07/04/2020 Notes: Venous Leg Ulcer Nursing Diagnoses: Knowledge deficit related to disease process and management Potential for venous Insuffiency (use before diagnosis confirmed) Goals: Patient will maintain optimal edema control Date Initiated: 07/04/2020 Target Resolution  Date:  09/05/2020 Goal Status: Active Interventions: Assess peripheral edema status every visit. Compression as ordered Provide education on venous insufficiency Treatment Activities: Therapeutic compression applied : 07/04/2020 Notes: Wound/Skin Impairment Nursing Diagnoses: Impaired tissue integrity Knowledge deficit related to ulceration/compromised skin integrity Goals: Patient/caregiver will verbalize understanding of skin care regimen Date Initiated: 07/04/2020 Target Resolution Date: 09/05/2020 Goal Status: Active Ulcer/skin breakdown will have a volume reduction of 30% by week 4 Date Initiated: 07/04/2020 Target Resolution Date: 09/05/2020 Goal Status: Active Interventions: Assess patient/caregiver ability to obtain necessary supplies Assess patient/caregiver ability to perform ulcer/skin care regimen upon admission and as needed Assess ulceration(s) every visit Provide education on ulcer and skin care Treatment Activities: Skin care regimen initiated : 07/04/2020 Topical wound management initiated : 07/04/2020 Notes: Electronic Signature(s) Signed: 08/22/2020 5:01:53 PM By: [name] RN, BSN Entered By: [name] on 08/22/2020 10:18:24 -------------------------------------------------------------------------------- Pain Assessment Details Patient Name: Date of Service: ROMMEL, HOGSTON HA [name] 08/22/2020 9:15 [name] Medical Record Number: [id number] Patient Account Number: [id number] Date of Birth/Sex: Treating RN: October 24, 1945 (74 y.o. [name] Primary Care Deshon Hsiao: [name] Other Clinician: Referring Porscha Axley: Treating Tnya Ades/Extender: [name] in Treatment: 7 Active Problems Location of Pain Severity and Description of Pain Patient Has Paino No Site Locations Rate the pain. Current Pain Level: 0 Pain Management and Medication Current Pain Management: Medication: No Cold Application: No Rest:  No Massage: No Activity: No T.E.N.S.: No Heat Application: No Leg drop or elevation: No Is the Current Pain Management Adequate: Adequate How does your wound impact your activities of daily livingo Sleep: No Bathing: No Appetite: No Relationship With Others: No Bladder Continence: No Emotions: No Bowel Continence: No Work: No Toileting: No Drive: No Dressing: No Hobbies: No Electronic Signature(s) Signed: 08/22/2020 5:19:29 PM By: [name] Entered By: [name] on 08/22/2020 09:50:16 -------------------------------------------------------------------------------- Patient/Caregiver Education Details Patient Name: Date of Service: [name] HA [name] 12/3/2021andnbsp9:15 [name] Medical Record Number: [id number] Patient Account Number: [id number] Date of Birth/Gender: Treating RN: Oct 26, 1945 (74 y.o. [name] Primary Care Physician: [name] Other Clinician: Referring Physician: Treating Physician/Extender: [name] in Treatment: 7 Education Assessment Education Provided To: Patient Education Topics Provided Venous: Methods: Explain/Verbal Responses: Reinforcements needed, State content correctly Wound/Skin Impairment: Methods: Explain/Verbal Responses: Reinforcements needed, State content correctly Electronic Signature(s) Signed: 08/22/2020 5:01:53 PM By: [name] RN, BSN Entered By: [name] on 08/22/2020 10:18:45 -------------------------------------------------------------------------------- Wound Assessment Details Patient Name: Date of Service: VERSIE, FLEENER HA [name] 08/22/2020 9:15 [name] Medical Record Number: [id number] Patient Account Number: [id number] Date of Birth/Sex: Treating RN: 12/19/1945 (74 y.o. [name], Meta.Reding Primary Care Mavin Dyke: [name] Other Clinician: Referring Lynk Marti: Treating Jnae Thomaston/Extender: [name] in  Treatment: 7 Wound Status Wound Number: 12 Primary Diabetic Wound/Ulcer of the Lower Extremity Etiology: Wound Location: Left, Lateral Lower Leg Wound Open Wounding Event: Trauma Status: Date Acquired: 05/26/2020 Comorbid Cataracts, Anemia, Arrhythmia, Congestive Heart Failure, Weeks Of Treatment: 7 History: Hypertension, Type II Diabetes, Gout, Neuropathy Clustered Wound: No Wound Measurements Length: (cm) 0.9 Width: (cm) 0.2 Depth: (cm) 0.1 Area: (cm) 0.141 Volume: (cm) 0.014 % Reduction in Area: 95.9% % Reduction in Volume: 99% Epithelialization: Large (67-100%) Tunneling: No Undermining: No Wound Description Classification: Grade 2 Wound Margin: Distinct, outline attached Exudate Amount: Medium Exudate Type: Serosanguineous Exudate Color: red, brown Foul Odor After Cleansing: No Slough/Fibrino No Wound Bed Granulation Amount: Large (67-100%) Exposed Structure  Granulation Quality: Pink Fascia Exposed: No Necrotic Amount: None Present (0%) Fat Layer (Subcutaneous Tissue) Exposed: Yes Tendon Exposed: No Muscle Exposed: No Joint Exposed: No Bone Exposed: No Treatment Notes Wound #12 (Lower Leg) Wound Laterality: Left, Lateral Cleanser Wound Cleanser Discharge Instruction: Cleanse the wound with wound cleanser prior to applying a clean dressing using gauze sponges, not tissue or cotton balls. Peri-Wound Care Sween Lotion (Moisturizing lotion) Discharge Instruction: Apply moisturizing lotion as directed Topical Primary Dressing Hydrofera Blue Classic Foam, 2x2 in Discharge Instruction: Apply to wound bed as instructed Secondary Dressing Woven Gauze Sponge, Non-Sterile 4x4 in Discharge Instruction: Apply over primary dressing as directed. Secured With Compression Wrap ThreePress (3 layer compression wrap) Discharge Instruction: Apply three layer compression , unna at top to secure wrap Compression Stockings Add-Ons Electronic Signature(s) Signed: 08/22/2020  5:19:29 PM By: [name] Entered By: [name] on 08/22/2020 09:51:20 -------------------------------------------------------------------------------- Wound Assessment Details Patient Name: Date of Service: DAVONTA, STROOT 08/22/2020 9:15 [name] Medical Record Number: [id number] Patient Account Number: [id number] Date of Birth/Sex: Treating RN: Dec 11, 1945 (74 y.o. [name], Meta.Reding Primary Care Jeannelle Wiens: [name] Other Clinician: Referring Ozzie Knobel: Treating Kale Dols/Extender: [name] in Treatment: 7 Wound Status Wound Number: 13 Primary Diabetic Wound/Ulcer of the Lower Extremity Etiology: Wound Location: Left T Fifth oe Wound Open Wounding Event: Gradually Appeared Status: Date Acquired: 06/18/2020 Comorbid Cataracts, Anemia, Arrhythmia, Congestive Heart Failure, Weeks Of Treatment: 7 History: Hypertension, Type II Diabetes, Gout, Neuropathy Clustered Wound: No Wound Measurements Length: (cm) 1 Width: (cm) 0.4 Depth: (cm) 0.3 Area: (cm) 0.314 Volume: (cm) 0.094 % Reduction in Area: 44.4% % Reduction in Volume: -64.9% Epithelialization: None Tunneling: No Undermining: No Wound Description Classification: Grade 2 Wound Margin: Distinct, outline attached Exudate Amount: None Present Foul Odor After Cleansing: No Slough/Fibrino Yes Wound Bed Granulation Amount: None Present (0%) Exposed Structure Necrotic Amount: Large (67-100%) Fascia Exposed: No Necrotic Quality: Eschar, Adherent Slough Fat Layer (Subcutaneous Tissue) Exposed: No Tendon Exposed: No Muscle Exposed: No Joint Exposed: No Bone Exposed: No Treatment Notes Wound #13 (Toe Fifth) Wound Laterality: Left Cleanser Wound Cleanser Discharge Instruction: Cleanse the wound with wound cleanser prior to applying a clean dressing using gauze sponges, not tissue or cotton balls. Peri-Wound Care Topical Primary Dressing IODOFLEX 0.9% Cadexomer Iodine  Pad 4x6 cm Discharge Instruction: Apply to wound bed as instructed Secondary Dressing Woven Gauze Sponges 2x2 in Discharge Instruction: Apply over primary dressing as directed. Secured With Conforming Stretch Gauze Bandage, Sterile 2x75 (in/in) Discharge Instruction: Secure with stretch gauze as directed. Compression Wrap Compression Stockings Add-Ons Electronic Signature(s) Signed: 08/22/2020 5:19:29 PM By: [name] Entered By: [name] on 08/22/2020 09:52:09 -------------------------------------------------------------------------------- Vitals Details Patient Name: Date of Service: HAZE, ANTILLON HA [name] 08/22/2020 9:15 [name] Medical Record Number: [id number] Patient Account Number: [id number] Date of Birth/Sex: Treating RN: [birth year] (74 y.o. [name], Tammi Klippel Primary Care Dyanara Cozza: [name] Other Clinician: Referring Micha Erck: Treating Swanson Farnell/Extender: [name] in Treatment: 7 Vital Signs Time Taken: 09:41 Temperature (F): 98.5 Height (in): 68 Pulse (bpm): 69 Weight (lbs): 192 Respiratory Rate (breaths/min): 18 Body Mass Index (BMI): 29.2 Blood Pressure (mmHg): 141/63 Reference Range: 80 - 120 mg / dl Electronic Signature(s) Signed: 08/22/2020 5:19:29 PM By: [name] Entered By: [name] on 08/22/2020 09:50:05

## 2020-08-26 ENCOUNTER — Other Ambulatory Visit: Payer: Self-pay

## 2020-08-26 ENCOUNTER — Inpatient Hospital Stay (HOSPITAL_COMMUNITY)
Admission: EM | Admit: 2020-08-26 | Discharge: 2020-09-12 | DRG: 871 | Disposition: A | Discharge status: Home Health | Payer: Medicare Other | Attending: Internal Medicine | Admitting: Internal Medicine

## 2020-08-26 ENCOUNTER — Emergency Department (HOSPITAL_COMMUNITY): Payer: Medicare Other

## 2020-08-26 ENCOUNTER — Encounter (HOSPITAL_COMMUNITY): Payer: Self-pay | Admitting: Radiology

## 2020-08-26 DIAGNOSIS — I4892 Unspecified atrial flutter: Secondary | ICD-10-CM | POA: Diagnosis present

## 2020-08-26 DIAGNOSIS — T8619 Other complication of kidney transplant: Secondary | ICD-10-CM | POA: Diagnosis present

## 2020-08-26 DIAGNOSIS — I4891 Unspecified atrial fibrillation: Secondary | ICD-10-CM | POA: Diagnosis present

## 2020-08-26 DIAGNOSIS — D631 Anemia in chronic kidney disease: Secondary | ICD-10-CM | POA: Diagnosis present

## 2020-08-26 DIAGNOSIS — E1140 Type 2 diabetes mellitus with diabetic neuropathy, unspecified: Secondary | ICD-10-CM | POA: Diagnosis present

## 2020-08-26 DIAGNOSIS — I5031 Acute diastolic (congestive) heart failure: Secondary | ICD-10-CM | POA: Diagnosis not present

## 2020-08-26 DIAGNOSIS — N179 Acute kidney failure, unspecified: Secondary | ICD-10-CM | POA: Diagnosis present

## 2020-08-26 DIAGNOSIS — G9340 Encephalopathy, unspecified: Secondary | ICD-10-CM | POA: Diagnosis present

## 2020-08-26 DIAGNOSIS — I132 Hypertensive heart and chronic kidney disease with heart failure and with stage 5 chronic kidney disease, or end stage renal disease: Secondary | ICD-10-CM | POA: Diagnosis present

## 2020-08-26 DIAGNOSIS — M1A09X0 Idiopathic chronic gout, multiple sites, without tophus (tophi): Secondary | ICD-10-CM | POA: Diagnosis not present

## 2020-08-26 DIAGNOSIS — D696 Thrombocytopenia, unspecified: Secondary | ICD-10-CM | POA: Diagnosis present

## 2020-08-26 DIAGNOSIS — Z7982 Long term (current) use of aspirin: Secondary | ICD-10-CM

## 2020-08-26 DIAGNOSIS — E118 Type 2 diabetes mellitus with unspecified complications: Secondary | ICD-10-CM | POA: Diagnosis not present

## 2020-08-26 DIAGNOSIS — L03115 Cellulitis of right lower limb: Secondary | ICD-10-CM | POA: Diagnosis present

## 2020-08-26 DIAGNOSIS — Z940 Kidney transplant status: Secondary | ICD-10-CM

## 2020-08-26 DIAGNOSIS — R5381 Other malaise: Secondary | ICD-10-CM

## 2020-08-26 DIAGNOSIS — E119 Type 2 diabetes mellitus without complications: Secondary | ICD-10-CM | POA: Diagnosis not present

## 2020-08-26 DIAGNOSIS — A419 Sepsis, unspecified organism: Secondary | ICD-10-CM | POA: Diagnosis present

## 2020-08-26 DIAGNOSIS — Y830 Surgical operation with transplant of whole organ as the cause of abnormal reaction of the patient, or of later complication, without mention of misadventure at the time of the procedure: Secondary | ICD-10-CM | POA: Diagnosis present

## 2020-08-26 DIAGNOSIS — G9341 Metabolic encephalopathy: Secondary | ICD-10-CM | POA: Diagnosis present

## 2020-08-26 DIAGNOSIS — D649 Anemia, unspecified: Secondary | ICD-10-CM | POA: Diagnosis present

## 2020-08-26 DIAGNOSIS — Z79899 Other long term (current) drug therapy: Secondary | ICD-10-CM

## 2020-08-26 DIAGNOSIS — M79602 Pain in left arm: Secondary | ICD-10-CM | POA: Diagnosis present

## 2020-08-26 DIAGNOSIS — E875 Hyperkalemia: Secondary | ICD-10-CM | POA: Diagnosis present

## 2020-08-26 DIAGNOSIS — Z6827 Body mass index (BMI) 27.0-27.9, adult: Secondary | ICD-10-CM

## 2020-08-26 DIAGNOSIS — E11649 Type 2 diabetes mellitus with hypoglycemia without coma: Secondary | ICD-10-CM | POA: Diagnosis not present

## 2020-08-26 DIAGNOSIS — T463X5A Adverse effect of coronary vasodilators, initial encounter: Secondary | ICD-10-CM | POA: Diagnosis not present

## 2020-08-26 DIAGNOSIS — L89151 Pressure ulcer of sacral region, stage 1: Secondary | ICD-10-CM | POA: Diagnosis present

## 2020-08-26 DIAGNOSIS — R410 Disorientation, unspecified: Secondary | ICD-10-CM | POA: Diagnosis present

## 2020-08-26 DIAGNOSIS — L03116 Cellulitis of left lower limb: Secondary | ICD-10-CM | POA: Diagnosis present

## 2020-08-26 DIAGNOSIS — I5041 Acute combined systolic (congestive) and diastolic (congestive) heart failure: Secondary | ICD-10-CM | POA: Diagnosis present

## 2020-08-26 DIAGNOSIS — I952 Hypotension due to drugs: Secondary | ICD-10-CM | POA: Diagnosis not present

## 2020-08-26 DIAGNOSIS — I5043 Acute on chronic combined systolic (congestive) and diastolic (congestive) heart failure: Secondary | ICD-10-CM | POA: Diagnosis present

## 2020-08-26 DIAGNOSIS — N1832 Chronic kidney disease, stage 3b: Secondary | ICD-10-CM | POA: Diagnosis present

## 2020-08-26 DIAGNOSIS — Z781 Physical restraint status: Secondary | ICD-10-CM

## 2020-08-26 DIAGNOSIS — R509 Fever, unspecified: Secondary | ICD-10-CM

## 2020-08-26 DIAGNOSIS — R6520 Severe sepsis without septic shock: Secondary | ICD-10-CM | POA: Diagnosis present

## 2020-08-26 DIAGNOSIS — I160 Hypertensive urgency: Secondary | ICD-10-CM | POA: Insufficient documentation

## 2020-08-26 DIAGNOSIS — Z794 Long term (current) use of insulin: Secondary | ICD-10-CM | POA: Diagnosis not present

## 2020-08-26 DIAGNOSIS — E1165 Type 2 diabetes mellitus with hyperglycemia: Secondary | ICD-10-CM | POA: Diagnosis present

## 2020-08-26 DIAGNOSIS — E0821 Diabetes mellitus due to underlying condition with diabetic nephropathy: Secondary | ICD-10-CM | POA: Diagnosis not present

## 2020-08-26 DIAGNOSIS — D598 Other acquired hemolytic anemias: Secondary | ICD-10-CM | POA: Diagnosis not present

## 2020-08-26 DIAGNOSIS — E440 Moderate protein-calorie malnutrition: Secondary | ICD-10-CM | POA: Diagnosis present

## 2020-08-26 DIAGNOSIS — I161 Hypertensive emergency: Secondary | ICD-10-CM

## 2020-08-26 DIAGNOSIS — Z7952 Long term (current) use of systemic steroids: Secondary | ICD-10-CM

## 2020-08-26 DIAGNOSIS — E872 Acidosis: Secondary | ICD-10-CM | POA: Diagnosis present

## 2020-08-26 DIAGNOSIS — N189 Chronic kidney disease, unspecified: Secondary | ICD-10-CM | POA: Diagnosis present

## 2020-08-26 DIAGNOSIS — I5032 Chronic diastolic (congestive) heart failure: Secondary | ICD-10-CM | POA: Diagnosis not present

## 2020-08-26 DIAGNOSIS — M7989 Other specified soft tissue disorders: Secondary | ICD-10-CM | POA: Diagnosis not present

## 2020-08-26 DIAGNOSIS — M79601 Pain in right arm: Secondary | ICD-10-CM | POA: Diagnosis present

## 2020-08-26 DIAGNOSIS — I10 Essential (primary) hypertension: Secondary | ICD-10-CM | POA: Diagnosis not present

## 2020-08-26 DIAGNOSIS — L0390 Cellulitis, unspecified: Secondary | ICD-10-CM | POA: Diagnosis not present

## 2020-08-26 DIAGNOSIS — I5023 Acute on chronic systolic (congestive) heart failure: Secondary | ICD-10-CM | POA: Diagnosis not present

## 2020-08-26 DIAGNOSIS — L97529 Non-pressure chronic ulcer of other part of left foot with unspecified severity: Secondary | ICD-10-CM | POA: Diagnosis present

## 2020-08-26 DIAGNOSIS — M79609 Pain in unspecified limb: Secondary | ICD-10-CM | POA: Diagnosis not present

## 2020-08-26 DIAGNOSIS — G8929 Other chronic pain: Secondary | ICD-10-CM | POA: Diagnosis present

## 2020-08-26 DIAGNOSIS — Z888 Allergy status to other drugs, medicaments and biological substances status: Secondary | ICD-10-CM

## 2020-08-26 DIAGNOSIS — I82612 Acute embolism and thrombosis of superficial veins of left upper extremity: Secondary | ICD-10-CM | POA: Diagnosis present

## 2020-08-26 DIAGNOSIS — Z20822 Contact with and (suspected) exposure to covid-19: Secondary | ICD-10-CM | POA: Diagnosis present

## 2020-08-26 DIAGNOSIS — Z8249 Family history of ischemic heart disease and other diseases of the circulatory system: Secondary | ICD-10-CM

## 2020-08-26 DIAGNOSIS — E1122 Type 2 diabetes mellitus with diabetic chronic kidney disease: Secondary | ICD-10-CM | POA: Diagnosis present

## 2020-08-26 DIAGNOSIS — E1121 Type 2 diabetes mellitus with diabetic nephropathy: Secondary | ICD-10-CM | POA: Diagnosis present

## 2020-08-26 DIAGNOSIS — E785 Hyperlipidemia, unspecified: Secondary | ICD-10-CM | POA: Diagnosis present

## 2020-08-26 DIAGNOSIS — J90 Pleural effusion, not elsewhere classified: Secondary | ICD-10-CM

## 2020-08-26 DIAGNOSIS — E11621 Type 2 diabetes mellitus with foot ulcer: Secondary | ICD-10-CM | POA: Diagnosis present

## 2020-08-26 DIAGNOSIS — L8990 Pressure ulcer of unspecified site, unspecified stage: Secondary | ICD-10-CM | POA: Insufficient documentation

## 2020-08-26 DIAGNOSIS — J810 Acute pulmonary edema: Secondary | ICD-10-CM

## 2020-08-26 LAB — FOLATE: Folate: 11.8 ng/mL (ref >5.9)

## 2020-08-26 LAB — CBG MONITORING, ED
Glucose-Capillary: 206 mg/dL — ABNORMAL HIGH (ref 70–99)
Glucose-Capillary: 210 mg/dL — ABNORMAL HIGH (ref 70–99)

## 2020-08-26 LAB — COMPREHENSIVE METABOLIC PANEL
ALT: 11 U/L (ref 0–44)
AST: 28 U/L (ref 15–41)
Albumin: 4 g/dL (ref 3.5–5.0)
Alkaline Phosphatase: 95 U/L (ref 38–126)
Anion gap: 12 (ref 5–15)
BUN: 32 mg/dL — ABNORMAL HIGH (ref 8–23)
CO2: 17 mmol/L — ABNORMAL LOW (ref 22–32)
Calcium: 9.8 mg/dL (ref 8.9–10.3)
Chloride: 116 mmol/L — ABNORMAL HIGH (ref 98–111)
Creatinine, Ser: 1.85 mg/dL — ABNORMAL HIGH (ref 0.61–1.24)
GFR, Estimated: 38 mL/min — ABNORMAL LOW (ref >60)
Glucose, Bld: 232 mg/dL — ABNORMAL HIGH (ref 70–99)
Potassium: 4.7 mmol/L (ref 3.5–5.1)
Sodium: 145 mmol/L (ref 135–145)
Total Bilirubin: 1.4 mg/dL — ABNORMAL HIGH (ref 0.3–1.2)
Total Protein: 6.5 g/dL (ref 6.5–8.1)

## 2020-08-26 LAB — URINALYSIS, ROUTINE W REFLEX MICROSCOPIC
Bilirubin Urine: NEGATIVE
Glucose, UA: 150 mg/dL — AB
Ketones, ur: 5 mg/dL — AB
Leukocytes,Ua: NEGATIVE
Nitrite: NEGATIVE
Protein, ur: >=300 mg/dL — AB
Specific Gravity, Urine: 1.012 (ref 1.005–1.030)
pH: 5 (ref 5.0–8.0)

## 2020-08-26 LAB — CBC WITH DIFFERENTIAL/PLATELET
Abs Immature Granulocytes: 0.04 10*3/uL (ref 0.00–0.07)
Basophils Absolute: 0 10*3/uL (ref 0.0–0.1)
Basophils Relative: 0 %
Eosinophils Absolute: 0.1 10*3/uL (ref 0.0–0.5)
Eosinophils Relative: 1 %
HCT: 37.6 % — ABNORMAL LOW (ref 39.0–52.0)
Hemoglobin: 10.9 g/dL — ABNORMAL LOW (ref 13.0–17.0)
Immature Granulocytes: 0 %
Lymphocytes Relative: 11 %
Lymphs Abs: 1.1 10*3/uL (ref 0.7–4.0)
MCH: 27.9 pg (ref 26.0–34.0)
MCHC: 29 g/dL — ABNORMAL LOW (ref 30.0–36.0)
MCV: 96.4 fL (ref 80.0–100.0)
Monocytes Absolute: 1 10*3/uL (ref 0.1–1.0)
Monocytes Relative: 9 %
Neutro Abs: 8.5 10*3/uL — ABNORMAL HIGH (ref 1.7–7.7)
Neutrophils Relative %: 79 %
Platelets: 120 10*3/uL — ABNORMAL LOW (ref 150–400)
RBC: 3.9 MIL/uL — ABNORMAL LOW (ref 4.22–5.81)
RDW: 15.1 % (ref 11.5–15.5)
WBC: 10.7 10*3/uL — ABNORMAL HIGH (ref 4.0–10.5)
nRBC: 0 % (ref 0.0–0.2)

## 2020-08-26 LAB — BLOOD GAS, VENOUS
Acid-base deficit: 6.1 mmol/L — ABNORMAL HIGH (ref 0.0–2.0)
Bicarbonate: 18.7 mmol/L — ABNORMAL LOW (ref 20.0–28.0)
O2 Saturation: 72.3 %
Patient temperature: 98.6
pCO2, Ven: 36.4 mmHg — ABNORMAL LOW (ref 44.0–60.0)
pH, Ven: 7.331 (ref 7.250–7.430)
pO2, Ven: 43.1 mmHg (ref 32.0–45.0)

## 2020-08-26 LAB — PROTIME-INR
INR: 1.3 — ABNORMAL HIGH (ref 0.8–1.2)
Prothrombin Time: 15.3 seconds — ABNORMAL HIGH (ref 11.4–15.2)

## 2020-08-26 LAB — RESP PANEL BY RT-PCR (FLU A&B, COVID) ARPGX2
Influenza A by PCR: NEGATIVE
Influenza B by PCR: NEGATIVE
SARS Coronavirus 2 by RT PCR: NEGATIVE

## 2020-08-26 LAB — VITAMIN B12: Vitamin B-12: 347 pg/mL (ref 180–914)

## 2020-08-26 LAB — LACTIC ACID, PLASMA: Lactic Acid, Venous: 1.9 mmol/L (ref 0.5–1.9)

## 2020-08-26 LAB — TSH: TSH: 2.205 u[IU]/mL (ref 0.350–4.500)

## 2020-08-26 LAB — BRAIN NATRIURETIC PEPTIDE: B Natriuretic Peptide: 532 pg/mL — ABNORMAL HIGH (ref 0.0–100.0)

## 2020-08-26 LAB — APTT: aPTT: 39 seconds — ABNORMAL HIGH (ref 24–36)

## 2020-08-26 LAB — AMMONIA: Ammonia: 31 umol/L (ref 9–35)

## 2020-08-26 MED ORDER — ONDANSETRON HCL 4 MG/2ML IJ SOLN: 4.0000 mg | Freq: Four times a day (QID) | INTRAMUSCULAR | Status: DC | PRN

## 2020-08-26 MED ORDER — LACTATED RINGERS IV BOLUS
500.0000 mL | Freq: Once | INTRAVENOUS | Status: AC
  Administered 2020-08-26: 500 mL via INTRAVENOUS

## 2020-08-26 MED ORDER — LATANOPROST 0.005 % OP SOLN
1.0000 [drp] | Freq: Every day | OPHTHALMIC | Status: DC
  Administered 2020-08-26 – 2020-09-11 (×14): 1 [drp] via OPHTHALMIC
  Filled 2020-08-26 (×4): qty 2.5

## 2020-08-26 MED ORDER — HYDROCORTISONE NA SUCCINATE PF 100 MG IJ SOLR
50.0000 mg | Freq: Two times a day (BID) | INTRAMUSCULAR | Status: DC
  Administered 2020-08-26 – 2020-08-27 (×2): 50 mg via INTRAVENOUS
  Filled 2020-08-26 (×2): qty 2

## 2020-08-26 MED ORDER — SODIUM CHLORIDE 0.9 % IV SOLN
1.0000 g | INTRAVENOUS | Status: AC
  Administered 2020-08-27 – 2020-09-02 (×7): 1 g via INTRAVENOUS
  Filled 2020-08-26: qty 1
  Filled 2020-08-26 (×6): qty 10

## 2020-08-26 MED ORDER — HALOPERIDOL LACTATE 5 MG/ML IJ SOLN
3.0000 mg | Freq: Once | INTRAMUSCULAR | Status: AC
  Administered 2020-08-26: 3 mg via INTRAVENOUS

## 2020-08-26 MED ORDER — SODIUM CHLORIDE 0.9% FLUSH
3.0000 mL | Freq: Two times a day (BID) | INTRAVENOUS | Status: DC
  Administered 2020-08-26 – 2020-09-11 (×22): 3 mL via INTRAVENOUS

## 2020-08-26 MED ORDER — ONDANSETRON HCL 4 MG PO TABS: 4.0000 mg | ORAL_TABLET | Freq: Four times a day (QID) | ORAL | Status: DC | PRN

## 2020-08-26 MED ORDER — SODIUM CHLORIDE 0.9 % IV SOLN: 250.0000 mL | INTRAVENOUS | Status: DC | PRN

## 2020-08-26 MED ORDER — SODIUM CHLORIDE 0.9% FLUSH: 3.0000 mL | INTRAVENOUS | Status: DC | PRN

## 2020-08-26 MED ORDER — NITROGLYCERIN IN D5W 200-5 MCG/ML-% IV SOLN
0.0000 ug/min | INTRAVENOUS | Status: DC
  Administered 2020-08-26: 5 ug/min via INTRAVENOUS
  Filled 2020-08-26: qty 250

## 2020-08-26 MED ORDER — FUROSEMIDE 40 MG PO TABS
80.0000 mg | ORAL_TABLET | Freq: Every day | ORAL | Status: DC
  Administered 2020-08-27: 80 mg via ORAL
  Filled 2020-08-26 (×2): qty 2

## 2020-08-26 MED ORDER — FUROSEMIDE 10 MG/ML IJ SOLN
40.0000 mg | Freq: Once | INTRAMUSCULAR | Status: AC
  Administered 2020-08-26: 40 mg via INTRAVENOUS
  Filled 2020-08-26: qty 4

## 2020-08-26 MED ORDER — LINAGLIPTIN 5 MG PO TABS
5.0000 mg | ORAL_TABLET | Freq: Every day | ORAL | Status: DC
  Administered 2020-08-27 – 2020-09-06 (×9): 5 mg via ORAL
  Filled 2020-08-26 (×10): qty 1

## 2020-08-26 MED ORDER — ATORVASTATIN CALCIUM 10 MG PO TABS
10.0000 mg | ORAL_TABLET | Freq: Every day | ORAL | Status: DC
  Administered 2020-08-27 – 2020-09-12 (×15): 10 mg via ORAL
  Filled 2020-08-26 (×18): qty 1

## 2020-08-26 MED ORDER — INSULIN ASPART 100 UNIT/ML ~~LOC~~ SOLN
0.0000 [IU] | Freq: Every day | SUBCUTANEOUS | Status: DC
  Administered 2020-08-26 – 2020-08-29 (×3): 2 [IU] via SUBCUTANEOUS
  Administered 2020-08-30: 23:00:00 3 [IU] via SUBCUTANEOUS
  Administered 2020-09-01: 23:00:00 2 [IU] via SUBCUTANEOUS
  Administered 2020-09-04: 05:00:00 4 [IU] via SUBCUTANEOUS
  Administered 2020-09-06: 22:00:00 5 [IU] via SUBCUTANEOUS
  Administered 2020-09-08: 22:00:00 2 [IU] via SUBCUTANEOUS
  Administered 2020-09-09 – 2020-09-11 (×2): 3 [IU] via SUBCUTANEOUS
  Filled 2020-08-26: qty 0.05

## 2020-08-26 MED ORDER — HALOPERIDOL LACTATE 5 MG/ML IJ SOLN
INTRAMUSCULAR | Status: AC
  Filled 2020-08-26: qty 1

## 2020-08-26 MED ORDER — PREDNISONE 5 MG PO TABS: 5.0000 mg | ORAL_TABLET | Freq: Every day | ORAL | Status: DC

## 2020-08-26 MED ORDER — SODIUM CHLORIDE 0.9% FLUSH
3.0000 mL | Freq: Two times a day (BID) | INTRAVENOUS | Status: DC
  Administered 2020-08-26 – 2020-09-08 (×16): 3 mL via INTRAVENOUS

## 2020-08-26 MED ORDER — LORAZEPAM 2 MG/ML IJ SOLN
1.0000 mg | Freq: Once | INTRAMUSCULAR | Status: AC
  Administered 2020-08-26: 1 mg via INTRAVENOUS
  Filled 2020-08-26: qty 1

## 2020-08-26 MED ORDER — OXYCODONE-ACETAMINOPHEN 5-325 MG PO TABS
1.0000 | ORAL_TABLET | Freq: Four times a day (QID) | ORAL | Status: DC | PRN
  Administered 2020-08-26 (×2): 1 via ORAL
  Administered 2020-08-27: 2 via ORAL
  Administered 2020-08-28 – 2020-08-29 (×2): 1 via ORAL
  Filled 2020-08-26 (×2): qty 1
  Filled 2020-08-26: qty 2
  Filled 2020-08-26 (×2): qty 1

## 2020-08-26 MED ORDER — GABAPENTIN 100 MG PO CAPS
100.0000 mg | ORAL_CAPSULE | Freq: Three times a day (TID) | ORAL | Status: DC | PRN
  Administered 2020-08-28 – 2020-09-12 (×15): 100 mg via ORAL
  Filled 2020-08-26 (×16): qty 1

## 2020-08-26 MED ORDER — INSULIN ASPART 100 UNIT/ML ~~LOC~~ SOLN
0.0000 [IU] | Freq: Three times a day (TID) | SUBCUTANEOUS | Status: DC
  Administered 2020-08-27 (×2): 3 [IU] via SUBCUTANEOUS
  Administered 2020-08-27 – 2020-08-28 (×2): 2 [IU] via SUBCUTANEOUS
  Administered 2020-08-28 (×2): 1 [IU] via SUBCUTANEOUS
  Administered 2020-08-29: 3 [IU] via SUBCUTANEOUS
  Administered 2020-08-29: 1 [IU] via SUBCUTANEOUS
  Administered 2020-08-29: 2 [IU] via SUBCUTANEOUS
  Administered 2020-08-30: 13:00:00 5 [IU] via SUBCUTANEOUS
  Administered 2020-08-30: 09:00:00 3 [IU] via SUBCUTANEOUS
  Administered 2020-08-30: 17:00:00 7 [IU] via SUBCUTANEOUS
  Administered 2020-08-31: 13:00:00 5 [IU] via SUBCUTANEOUS
  Administered 2020-08-31: 17:00:00 3 [IU] via SUBCUTANEOUS
  Administered 2020-08-31: 08:00:00 7 [IU] via SUBCUTANEOUS
  Administered 2020-09-01: 18:00:00 3 [IU] via SUBCUTANEOUS
  Administered 2020-09-01: 08:00:00 2 [IU] via SUBCUTANEOUS
  Administered 2020-09-01 – 2020-09-02 (×2): 3 [IU] via SUBCUTANEOUS
  Administered 2020-09-02: 18:00:00 1 [IU] via SUBCUTANEOUS
  Administered 2020-09-02: 12:00:00 5 [IU] via SUBCUTANEOUS
  Administered 2020-09-04 (×2): 3 [IU] via SUBCUTANEOUS
  Administered 2020-09-04: 17:00:00 2 [IU] via SUBCUTANEOUS
  Administered 2020-09-05: 13:00:00 5 [IU] via SUBCUTANEOUS
  Administered 2020-09-05: 09:00:00 2 [IU] via SUBCUTANEOUS
  Administered 2020-09-06: 17:00:00 4 [IU] via SUBCUTANEOUS
  Administered 2020-09-07: 12:00:00 7 [IU] via SUBCUTANEOUS
  Administered 2020-09-07 (×2): 9 [IU] via SUBCUTANEOUS
  Administered 2020-09-08: 13:00:00 3 [IU] via SUBCUTANEOUS
  Administered 2020-09-08: 07:00:00 2 [IU] via SUBCUTANEOUS
  Administered 2020-09-08: 17:00:00 3 [IU] via SUBCUTANEOUS
  Administered 2020-09-09: 13:00:00 5 [IU] via SUBCUTANEOUS
  Administered 2020-09-09: 17:00:00 7 [IU] via SUBCUTANEOUS
  Administered 2020-09-09: 07:00:00 2 [IU] via SUBCUTANEOUS
  Administered 2020-09-10: 18:00:00 4 [IU] via SUBCUTANEOUS
  Administered 2020-09-11 (×2): 5 [IU] via SUBCUTANEOUS
  Administered 2020-09-11: 06:00:00 2 [IU] via SUBCUTANEOUS
  Administered 2020-09-12: 07:00:00 3 [IU] via SUBCUTANEOUS
  Filled 2020-08-26: qty 0.09

## 2020-08-26 MED ORDER — TACROLIMUS 1 MG PO CAPS
1.0000 mg | ORAL_CAPSULE | Freq: Two times a day (BID) | ORAL | Status: DC
  Administered 2020-08-26 – 2020-09-12 (×30): 1 mg via ORAL
  Filled 2020-08-26 (×33): qty 1

## 2020-08-26 MED ORDER — ACETAMINOPHEN 650 MG RE SUPP
650.0000 mg | Freq: Four times a day (QID) | RECTAL | Status: DC | PRN
  Administered 2020-08-29: 650 mg via RECTAL
  Filled 2020-08-26: qty 1

## 2020-08-26 MED ORDER — CARVEDILOL 6.25 MG PO TABS
6.2500 mg | ORAL_TABLET | Freq: Two times a day (BID) | ORAL | Status: DC
  Administered 2020-08-27: 6.25 mg via ORAL

## 2020-08-26 MED ORDER — HYDRALAZINE HCL 25 MG PO TABS
25.0000 mg | ORAL_TABLET | Freq: Three times a day (TID) | ORAL | Status: DC
  Administered 2020-08-26 – 2020-08-28 (×6): 25 mg via ORAL
  Filled 2020-08-26 (×6): qty 1

## 2020-08-26 MED ORDER — CALCITRIOL 0.25 MCG PO CAPS
0.2500 ug | ORAL_CAPSULE | Freq: Every day | ORAL | Status: DC
  Administered 2020-08-27 – 2020-09-12 (×15): 0.25 ug via ORAL
  Filled 2020-08-26 (×17): qty 1

## 2020-08-26 MED ORDER — NITROGLYCERIN IN D5W 200-5 MCG/ML-% IV SOLN
0.0000 ug/min | INTRAVENOUS | Status: DC
  Administered 2020-08-27 (×2): 20 ug/min via INTRAVENOUS
  Administered 2020-08-29: 5 ug/min via INTRAVENOUS
  Filled 2020-08-26 (×2): qty 250

## 2020-08-26 MED ORDER — MYCOPHENOLATE MOFETIL 250 MG PO CAPS
500.0000 mg | ORAL_CAPSULE | Freq: Two times a day (BID) | ORAL | Status: DC
  Administered 2020-08-26 – 2020-08-30 (×7): 500 mg via ORAL
  Filled 2020-08-26 (×8): qty 2

## 2020-08-26 MED ORDER — SENNOSIDES-DOCUSATE SODIUM 8.6-50 MG PO TABS: 1.0000 | ORAL_TABLET | Freq: Every evening | ORAL | Status: DC | PRN

## 2020-08-26 MED ORDER — VANCOMYCIN HCL IN DEXTROSE 1-5 GM/200ML-% IV SOLN
1000.0000 mg | Freq: Once | INTRAVENOUS | Status: AC
  Administered 2020-08-26: 1000 mg via INTRAVENOUS
  Filled 2020-08-26: qty 200

## 2020-08-26 MED ORDER — ASPIRIN EC 81 MG PO TBEC
162.0000 mg | DELAYED_RELEASE_TABLET | Freq: Two times a day (BID) | ORAL | Status: DC
  Administered 2020-08-27 – 2020-09-12 (×29): 162 mg via ORAL
  Filled 2020-08-26 (×31): qty 2

## 2020-08-26 MED ORDER — ALLOPURINOL 100 MG PO TABS
100.0000 mg | ORAL_TABLET | Freq: Every day | ORAL | Status: DC
  Administered 2020-08-27 – 2020-09-12 (×16): 100 mg via ORAL
  Filled 2020-08-26 (×17): qty 1

## 2020-08-26 MED ORDER — LACTATED RINGERS IV SOLN: INTRAVENOUS | Status: DC

## 2020-08-26 MED ORDER — ACETAMINOPHEN 325 MG PO TABS
650.0000 mg | ORAL_TABLET | Freq: Four times a day (QID) | ORAL | Status: DC | PRN
  Administered 2020-08-29 – 2020-09-12 (×14): 650 mg via ORAL
  Filled 2020-08-26 (×16): qty 2

## 2020-08-26 MED ORDER — METRONIDAZOLE 500 MG PO TABS
500.0000 mg | ORAL_TABLET | Freq: Three times a day (TID) | ORAL | Status: DC
  Administered 2020-08-26: 500 mg via ORAL
  Filled 2020-08-26: qty 1

## 2020-08-26 MED ORDER — SODIUM CHLORIDE 0.9 % IV SOLN
2.0000 g | Freq: Once | INTRAVENOUS | Status: AC
  Administered 2020-08-26: 2 g via INTRAVENOUS
  Filled 2020-08-26: qty 2

## 2020-08-26 MED ORDER — INSULIN ASPART 100 UNIT/ML ~~LOC~~ SOLN
3.0000 [IU] | Freq: Three times a day (TID) | SUBCUTANEOUS | Status: DC
  Administered 2020-08-27 – 2020-08-30 (×6): 3 [IU] via SUBCUTANEOUS
  Filled 2020-08-26: qty 0.03

## 2020-08-26 MED ORDER — HEPARIN SODIUM (PORCINE) 5000 UNIT/ML IJ SOLN
5000.0000 [IU] | Freq: Three times a day (TID) | INTRAMUSCULAR | Status: DC
  Administered 2020-08-26 – 2020-09-11 (×40): 5000 [IU] via SUBCUTANEOUS
  Filled 2020-08-26 (×44): qty 1

## 2020-08-26 NOTE — ED Triage Notes (Signed)
Per [facility] pt from home. HX kidney trans, Heart failure, AFIB, wounds on foot, diabetes. Normally A& O X4 but today oriented X2. Febrile,Tach,AMS.  150 NS before arrival.  219/108 CBG 255 101.5 96 RA, 99% 2L ETCO 30-35

## 2020-08-26 NOTE — Progress Notes (Signed)
A consult was received from an ED physician for vancomycin and cefepime per pharmacy dosing.  The patient's profile has been reviewed for ht/wt/allergies/indication/available labs.    A one time order has been placed for vancomycin 1gm and cefepime 2mg  IV x1 .  Further antibiotics/pharmacy consults should be ordered by admitting physician if indicated.                       Thank you, Lynelle Doctor 08/26/2020  3:40 PM

## 2020-08-26 NOTE — H&P (Signed)
History and Physical    Jash Wahlen ZGY:[id number] DOB: 12-Jan-1946 DOA: 08/26/2020  PCP: [name], MD   Patient coming from: Home   Chief Complaint: Confusion   HPI: Nihaal Friesen is a 74 y.o. male with medical history significant for history of renal transplant, chronic kidney disease stage IIIb, chronic diastolic CHF, insulin-dependent diabetes mellitus, hypertension, and chronic left foot wound, now presenting to the emergency department with confusion.  Patient had reportedly been complaining of some left foot pain and nausea last night but otherwise seem to be in his usual state when his wife left the house today but she was later called by a home health worker who reported that the patient was confused.  He was able to open the door for the home health agent but unable to answer any of her basic questions.  Patient is unable to contribute to the history.   ED Course: Upon arrival to the ED, patient is found to be febrile to 39.3 C, saturating 98% on 2 L/min of supplemental oxygen, and hypertensive with systolic pressure as high as 237.  EKG features sinus tachycardia with rate 105.  Noncontrast head CT negative for acute intracranial abnormality.  Chest x-ray notable for cardiomegaly with mild diffuse interstitial edema.  Radiographs of the left foot demonstrate soft tissue swelling about the left fifth MTP without acute underlying bony abnormality.  No acute findings on CT of the abdomen and pelvis.  Chemistry panel features a glucose of 232, bicarbonate 17, and creatinine 1.85.  CBC notable for mild leukocytosis and thrombocytopenia, and a improved normocytic anemia.  Lactic acid is 1.9.  BNP 532.  Blood and urine cultures were collected in the emergency department patient was given 500 cc of saline, 40 mg IV Lasix, vancomycin, cefepime, and Flagyl.  He was started on nitroglycerin infusion for blood pressure.  COVID-19 pcr is negative.  Review of Systems:  Unable to complete  ROS secondary the patient's clinical condition.  Past Medical History:  Diagnosis Date  . Anemia   . Atrial fibrillation ([place])   . CHF (congestive heart failure) ([place])   . Chronic kidney disease   . Diabetes mellitus   . Dysrhythmia   . Hyperlipidemia   . Hypertension   . Neuropathy   . Wound of left leg 08/2018    Past Surgical History:  Procedure Laterality Date  . ABDOMINAL AORTOGRAM W/LOWER EXTREMITY N/A 10/12/2019   Procedure: ABDOMINAL AORTOGRAM W/LOWER EXTREMITY;  Surgeon: Angelia Mould, MD;  Location: [facility];  Service: Cardiovascular;  Laterality: N/A;  With CO2  . access proceds    . achlles tendon repair    . AMPUTATION Right 05/22/2020   Procedure: RIGHT INDEX FINGER DEBRIDEMENT AND AMPUTATION;  Surgeon: Leanora Cover, MD;  Location: [facility];  Service: Orthopedics;  Laterality: Right;  . ARTERIOVENOUS GRAFT PLACEMENT    . bowel repla     repair  . ENDOVENOUS ABLATION SAPHENOUS VEIN W/ LASER Right 10/04/2019   endovenous laser ablation right greater saphenous vein by Gae Gallop MD   . [place] and 2005  . LIGATION GORETEX GRAFT  06/30/11   Right upper arm    Social History:   reports that he has never smoked. He has never used smokeless tobacco. He reports that he does not drink alcohol and does not use drugs.  Allergies  Allergen Reactions  . Elavil [Amitriptyline Hcl] Other (See Comments)    Unknown felt as if  he was tripping    Family History  Problem Relation Age of Onset  . Heart disease Mother   . Heart attack Father   . Stroke Sister      Prior to Admission medications   Medication Sig Start Date End Date Taking? Authorizing Provider  acetaminophen (TYLENOL) 500 MG tablet Take 1,000 mg by mouth every 6 (six) hours as needed for mild pain or moderate pain.   Yes [provider]  allopurinol (ZYLOPRIM) 100 MG tablet Take 1 tablet (100 mg total) by mouth daily. 06/06/20 06/06/21 Yes Florencia Reasons, MD  aspirin 81 MG tablet Take 162 mg by mouth 2 (two) times daily.    Yes [provider]  atorvastatin (LIPITOR) 10 MG tablet Take 10 mg by mouth daily.   Yes [provider]  calcitRIOL (ROCALTROL) 0.25 MCG capsule Take 0.25 mcg by mouth daily.  07/22/15  Yes [provider]  carvedilol (COREG) 6.25 MG tablet Take 1 tablet (6.25 mg total) by mouth 2 (two) times daily with a meal. 01/29/20  Yes Burtis Junes, NP  diphenhydrAMINE (BENADRYL) 25 mg capsule Take 1 capsule (25 mg total) by mouth at bedtime as needed (for sleep/allergies.). 09/11/18  Yes Regalado, Belkys A, MD  doxycycline (VIBRAMYCIN) 100 MG capsule Take 100 mg by mouth 2 (two) times daily. 10 DS 08/22/20  Yes [provider]  furosemide (LASIX) 40 MG tablet Take 1 tablet (40 mg total) by mouth daily. Patient taking differently: Take 80 mg by mouth daily.  06/06/20  Yes Florencia Reasons, MD  gabapentin (NEURONTIN) 100 MG capsule Take 100 mg by mouth See admin instructions. Take 1 capule (100mg ) three times/day as needed for neuropathy or 1 capsule (100mg ) at bedtime   Yes [provider]  HUMALOG KWIKPEN 100 UNIT/ML KwikPen Inject 3-5 Units into the skin 3 (three) times daily. Sliding scale 07/17/18  Yes [provider]  hydrALAZINE (APRESOLINE) 25 MG tablet Take 25 mg by mouth 3 (three) times daily.  08/22/18  Yes [provider]  icosapent Ethyl (VASCEPA) 1 g capsule Take 2 g by mouth 2 (two) times daily.   Yes [provider]  LANTUS SOLOSTAR 100 UNIT/ML Solostar Pen Inject 7-15 Units into the skin daily. If blood sugars are >300, use 15 units, <300 use 7 units 06/04/14  Yes [provider]  latanoprost (XALATAN) 0.005 % ophthalmic solution Place 1 drop into both eyes at bedtime. 07/28/18  Yes [provider]  linagliptin (TRADJENTA) 5 MG TABS tablet Take 5 mg by mouth daily.     Yes [provider]  mycophenolate (CELLCEPT) 500 MG tablet Take  500 mg by mouth 2 (two) times daily.    Yes [provider]  oxyCODONE-acetaminophen (PERCOCET/ROXICET) 5-325 MG tablet Take 1-2 tablets by mouth every 6 (six) hours as needed for moderate pain.  05/23/20  Yes [provider]  predniSONE (DELTASONE) 5 MG tablet Take 5 mg by mouth daily. 07/07/18  Yes [provider]  tacrolimus (PROGRAF) 1 MG capsule Take 1 mg by mouth 2 (two) times daily.    Yes [provider]  Alcohol Swabs (B-D SINGLE USE SWABS REGULAR) PADS Use as directed 09/07/15   [provider]  BAYER CONTOUR NEXT TEST test strip Use as directed 09/12/15   [provider]  BD PEN NEEDLE NANO U/F 32G X 4 MM MISC Use as directed 09/25/15   [provider]  [place] Use as directed 07/14/15  [provider]  senna-docusate (SENOKOT-S) 8.6-50 MG tablet Take 1 tablet by mouth at bedtime. Patient not taking: Reported on 08/26/2020 06/06/20   Florencia Reasons, MD    Physical Exam: Vitals:   08/26/20 1822 08/26/20 1915 08/26/20 1931 08/26/20 1945  BP: (!) 168/88 (!) 211/118 (!) 208/84 (!) 195/108  Pulse: 99 (!) 101 (!) 43 96  Resp: (!) 25 16 (!) 21 (!) 26  Temp: (!) 101 F (38.3 C)     TempSrc: Oral     SpO2: 97% 91% 94% 94%    Constitutional: yelling, confused, no pallor or diaphoresis  Eyes: PERTLA, lids and conjunctivae normal ENMT: Mucous membranes are moist. Posterior pharynx clear of any exudate or lesions.   Neck: normal, supple, no masses, no thyromegaly Respiratory: Mild tachypnea, no wheezing. No pallor or cyanosis.  Cardiovascular: S1 & S2 heard, regular rate and rhythm. No extremity edema.  Abdomen: No distension, no tenderness, soft. Bowel sounds active.  Musculoskeletal: no clubbing / cyanosis. No joint deformity upper and lower extremities.   Skin: Ulcerations involving left foot. Left 1st toe is red, swollen, and tender. Hyperpigmentation involving lower legs in gaiter distribution. Warm, dry,  well-perfused. Neurologic: CN 2-12 grossly intact. Sensation intact. Moving all extremities.  Psychiatric: Alert. Yelling but not answering any questions.     Labs and Imaging on Admission: I have personally reviewed following labs and imaging studies  CBC: Recent Labs  Lab 08/26/20 1530  WBC 10.7*  NEUTROABS 8.5*  HGB 10.9*  HCT 37.6*  MCV 96.4  PLT 952*   Basic Metabolic Panel: Recent Labs  Lab 08/26/20 1530  NA 145  K 4.7  CL 116*  CO2 17*  GLUCOSE 232*  BUN 32*  CREATININE 1.85*  CALCIUM 9.8   GFR: CrCl cannot be calculated (Unknown ideal weight.). Liver Function Tests: Recent Labs  Lab 08/26/20 1530  AST 28  ALT 11  ALKPHOS 95  BILITOT 1.4*  PROT 6.5  ALBUMIN 4.0   No results for input(s): LIPASE, AMYLASE in the last 168 hours. No results for input(s): AMMONIA in the last 168 hours. Coagulation Profile: Recent Labs  Lab 08/26/20 1530  INR 1.3*   Cardiac Enzymes: No results for input(s): CKTOTAL, CKMB, CKMBINDEX, TROPONINI in the last 168 hours. BNP (last 3 results) No results for input(s): PROBNP in the last 8760 hours. HbA1C: No results for input(s): HGBA1C in the last 72 hours. CBG: Recent Labs  Lab 08/26/20 1532  GLUCAP 206*   Lipid Profile: No results for input(s): CHOL, HDL, LDLCALC, TRIG, CHOLHDL, LDLDIRECT in the last 72 hours. Thyroid Function Tests: No results for input(s): TSH, T4TOTAL, FREET4, T3FREE, THYROIDAB in the last 72 hours. Anemia Panel: No results for input(s): VITAMINB12, FOLATE, FERRITIN, TIBC, IRON, RETICCTPCT in the last 72 hours. Urine analysis:    Component Value Date/Time   COLORURINE YELLOW 08/26/2020 1533   APPEARANCEUR CLEAR 08/26/2020 1533   LABSPEC 1.012 08/26/2020 1533   PHURINE 5.0 08/26/2020 1533   GLUCOSEU 150 (A) 08/26/2020 1533   HGBUR SMALL (A) 08/26/2020 1533   BILIRUBINUR NEGATIVE 08/26/2020 1533   KETONESUR 5 (A) 08/26/2020 1533   PROTEINUR >=300 (A) 08/26/2020 1533   NITRITE NEGATIVE  08/26/2020 1533   LEUKOCYTESUR NEGATIVE 08/26/2020 1533   Sepsis Labs: @LABRCNTIP (procalcitonin:4,lacticidven:4) ) Recent Results (from the past 240 hour(s))  Resp Panel by RT-PCR (Flu A&B, Covid) In/Out Cath Urine     Status: None   Collection Time: 08/26/20  4:30 PM   Specimen: In/Out Cath Urine;  Nasopharyngeal(NP) swabs in vial transport medium  Result Value Ref Range Status   SARS Coronavirus 2 by RT PCR NEGATIVE NEGATIVE Final    Comment: (NOTE) SARS-CoV-2 target nucleic acids are NOT DETECTED.  The SARS-CoV-2 RNA is generally detectable in upper respiratory specimens during the acute phase of infection. The lowest concentration of SARS-CoV-2 viral copies this assay can detect is 138 copies/mL. A negative result does not preclude SARS-Cov-2 infection and should not be used as the sole basis for treatment or other patient management decisions. A negative result may occur with  improper specimen collection/handling, submission of specimen other than nasopharyngeal swab, presence of viral mutation(s) within the areas targeted by this assay, and inadequate number of viral copies(<138 copies/mL). A negative result must be combined with clinical observations, patient history, and epidemiological information. The expected result is Negative.  Fact Sheet for Patients:  EntrepreneurPulse.com.au  Fact Sheet for Healthcare Providers:  IncredibleEmployment.be  This test is no t yet approved or cleared by the [place] FDA and  has been authorized for detection and/or diagnosis of SARS-CoV-2 by FDA under an Emergency Use Authorization (EUA). This EUA will remain  in effect (meaning this test can be used) for the duration of the COVID-19 declaration under Section 564(b)(1) of the Act, 21 U.S.C.section 360bbb-3(b)(1), unless the authorization is terminated  or revoked sooner.       Influenza A by PCR NEGATIVE NEGATIVE Final   Influenza B by  PCR NEGATIVE NEGATIVE Final    Comment: (NOTE) The Xpert Xpress SARS-CoV-2/FLU/RSV plus assay is intended as an aid in the diagnosis of influenza from Nasopharyngeal swab specimens and should not be used as a sole basis for treatment. Nasal washings and aspirates are unacceptable for Xpert Xpress SARS-CoV-2/FLU/RSV testing.  Fact Sheet for Patients: EntrepreneurPulse.com.au  Fact Sheet for Healthcare Providers: IncredibleEmployment.be  This test is not yet approved or cleared by the [place] FDA and has been authorized for detection and/or diagnosis of SARS-CoV-2 by FDA under an Emergency Use Authorization (EUA). This EUA will remain in effect (meaning this test can be used) for the duration of the COVID-19 declaration under Section 564(b)(1) of the Act, 21 U.S.C. section 360bbb-3(b)(1), unless the authorization is terminated or revoked.  Performed at [facility], [place] [address], [place], [place] [address]      Radiological Exams on Admission: CT ABDOMEN PELVIS WO CONTRAST  Result Date: 08/26/2020 CLINICAL DATA:  Acute abdominal and back pain, sepsis; history chronic kidney disease, prior renal transplant, CHF, diabetes mellitus, hypertension, atrial fibrillation EXAM: CT ABDOMEN AND PELVIS WITHOUT CONTRAST TECHNIQUE: Multidetector CT imaging of the abdomen and pelvis was performed following the standard protocol without IV contrast. Sagittal and coronal MPR images reconstructed from axial data set. Neither oral nor intravenous contrast were administered. Patient respiratory motion artifacts degrade exam. COMPARISON:  05/26/2020 FINDINGS: Lower chest: Small bibasilar pleural effusions and compressive atelectasis in the RIGHT lower lobe. Pericardial calcification. Enlargement of cardiac chambers. Hepatobiliary: Gallbladder and liver normal appearance Pancreas: Normal appearance Spleen: Normal appearance Adrenals/Urinary Tract:  Atrophic native kidneys. Small RIGHT renal cysts. Old calcified renal transplant in RIGHT iliac fossa. Transplant kidney LEFT iliac fossa without mass or hydronephrosis. Decompressed bladder. Stomach/Bowel: Scattered colonic diverticulosis. No definite CT evidence of acute diverticulitis. Stomach and bowel loops otherwise unremarkable. Vascular/Lymphatic: Extensive atherosclerotic calcifications aorta, iliac arteries, and femoral arteries. Additional coronary arterial and extensive small vessel vascular calcifications throughout abdomen and pelvis. Aorta normal caliber. No adenopathy. Reproductive: Unremarkable prostate gland and seminal  vesicles Other: Scattered stranding of tissue planes in abdominal wall, mesentery, and pelvis. No free air or free fluid. No hernia. Musculoskeletal: Diffuse osseous demineralization. IMPRESSION: Small bibasilar pleural effusions and compressive atelectasis in the RIGHT lower lobe. Pericardial calcification. Atrophic native kidneys with old calcified renal transplant in RIGHT iliac fossa. Transplant kidney LEFT iliac fossa without mass or hydronephrosis. Scattered colonic diverticulosis without evidence of diverticulitis. No definite acute intra-abdominal or intrapelvic abnormalities. Aortic Atherosclerosis (ICD10-I70.0). Electronically Signed   By: Lavonia Dana M.D.   On: 08/26/2020 19:41   CT Head Wo Contrast  Result Date: 08/26/2020 CLINICAL DATA:  74 year old male with altered mental status. Atrial fibrillation. Diabetic foot wound. Fever. EXAM: CT HEAD WITHOUT CONTRAST TECHNIQUE: Contiguous axial images were obtained from the base of the skull through the vertex without intravenous contrast. COMPARISON:  Brain MRI 05/28/2020.  Head CT 05/26/2020 and earlier. FINDINGS: Brain: Cerebral volume is stable and within normal limits. No midline shift, ventriculomegaly, mass effect, evidence of mass lesion, intracranial hemorrhage or evidence of cortically based acute infarction.  Stable gray-white matter differentiation, within normal limits for age. Vascular: Extensive Calcified atherosclerosis at the skull base. No suspicious intracranial vascular hyperdensity. Dolichoectatic distal right vertebral artery. Skull: No acute osseous abnormality identified. Sinuses/Orbits: Tympanic cavities and mastoids remain clear. Stable mild paranasal sinus mucosal thickening. Other: Calcified scalp vessel atherosclerosis. No acute orbit or scalp soft tissue finding. IMPRESSION: 1. No acute intracranial abnormality. Stable and negative for age non contrast CT appearance of the brain. 2. Very advanced calcified atherosclerosis. Electronically Signed   By: Genevie Ann M.D.   On: 08/26/2020 18:37   DG Chest Port 1 View  Result Date: 08/26/2020 CLINICAL DATA:  Soft tissue ulceration of the foot EXAM: PORTABLE CHEST 1 VIEW COMPARISON:  05/29/2020 FINDINGS: Cardiomegaly. Mild, diffuse interstitial pulmonary opacity. Osseous structures are unremarkable. IMPRESSION: Cardiomegaly and mild, diffuse interstitial pulmonary opacity, likely edema. No focal airspace opacity. Electronically Signed   By: Eddie Candle M.D.   On: 08/26/2020 16:51   DG Foot Complete Left  Result Date: 08/26/2020 CLINICAL DATA:  Soft tissue ulcer of the fifth digit. EXAM: LEFT FOOT - COMPLETE 3+ VIEW COMPARISON:  None. FINDINGS: Extensive microvascular calcifications are present, compatible with diabetes. Soft swelling is present about the fifth MTP joint. No rows of changes are present. No gas is evident within the soft tissues. No other acute or focal osseous abnormalities are present. IMPRESSION: 1. Soft tissue swelling about the fifth MTP joint without underlying osseous abnormality. 2. No radiopaque foreign body or gas in the soft tissues. 3. Extensive microvascular calcifications compatible with diabetes. Electronically Signed   By: San Morelle M.D.   On: 08/26/2020 16:52    EKG: Independently reviewed. Sinus tachycardia,  rate 105, PVCs, LAD.   Assessment/Plan   1. Acute encephalopathy  - Presents with acute encephalopathy, lethargic initially, now alert and yelling out in apparent pain but unable to localize  - Head CT negative and no focal neuro deficits identified  - Likely hypertensive or septic  - Continue BP control and empiric antibiotics, treat fever and pain as needed, check tacrolimus level, ammonia, TSH, B12, and RPR    2. SIRS, possible sepsis secondary to cellulitis  - Presents with acute encephalopathy and is found to have fever, mild leukocytosis, and slightly tachycardic  - No pneumonia on CXR, urine does not look infected, no acute findings on CT abd/pelvis, no meningismus, COVID and flu negative  - He has foot wounds that do not  appear grossly infected and no acute osseous abnormality on plain films; left 1st MTP is red and swollen  - Likely left foot cellulitis or gout flare  - Blood cultures were collected in ED and he was given vancomycin, cefepime, and Flagyl  - Continue antibiotic coverage with Rocephin for possible cellulitis, follow cultures and clinical course   3. CKD IIIb; renal transplant recipient  - SCr is 1.85 on admission, appears close to baseline  - Renally-dose medications, monitor renal function and electrolytes  - He has fever and mild leukocytosis, possibly due to cellulitis vs gout, will cautiously continue his immunosuppressants, increase his steroid for 3 days in setting of acute illness    4. Anemia  - Hgb is 10.9 on admission, improved from priors  - Likely related to CKD, chronic disease, will monitor    5. Hypertensive urgency  - BP as high as 329 systolic in ED, now improving on nitroglycerin infusion  - Continue Coreg, hydralazine, and Lasix, titrate nitroglycerin infusion for goal SBP of 155-180 overnight    6. Chronic diastolic CHF  - Appears compensated  - EF 55-60% in September 2021 - Continue Lasix and Coreg, monitor weight and I/Os   7.  Insulin-dependent DM  - A1c was 6.9% in September  - Check CBGs, continue linagliptin and insulin     DVT prophylaxis: sq heparin  Code Status: Full  Family Communication: Wife updated by phone  Disposition Plan:  Patient is from: home  Anticipated d/c is to: TBD Anticipated d/c date is: 08/29/20 Patient currently: Encephalopathic, possibly septic  Consults called: None  Admission status: Inpatient     Vianne Bulls, MD [facility]  08/26/2020, 8:28 PM

## 2020-08-26 NOTE — ED Notes (Signed)
Extra Thurnell Garbe, and Grey top tube sent to lab

## 2020-08-26 NOTE — ED Provider Notes (Signed)
[facility] DEPT Provider Note   CSN: [id number] Arrival date & time: 08/26/20  1429     History No chief complaint on file.   [name] is a 74 y.o. male.   Fever Temp source:  Unable to specify Severity:  Moderate Onset quality:  Gradual Duration: unable to specify. Timing:  Constant Progression:  Unchanged Chronicity:  New Associated symptoms: no chest pain        Past Medical History:  Diagnosis Date  . Anemia   . Atrial fibrillation ([place])   . CHF (congestive heart failure) ([place])   . Chronic kidney disease   . Diabetes mellitus   . Dysrhythmia   . Hyperlipidemia   . Hypertension   . Neuropathy   . Wound of left leg 08/2018    Patient Active Problem List   Diagnosis Date Noted  . Encounter for central line placement   . Altered mental status 05/26/2020  . Sepsis ([place]) 05/26/2020  . Hypernatremia 05/26/2020  . Increased anion gap metabolic acidosis 83/15/1761  . Acute metabolic encephalopathy 60/73/7106  . Anemia due to chronic kidney disease 09/06/2018  . Diabetes mellitus type 2, insulin dependent ([place]) 09/06/2018  . Community acquired pneumonia 09/06/2018  . Chronic diastolic heart failure ([place]) 03/25/2017  . Essential hypertension 03/25/2017  . Chronic anticoagulation 02/04/2017  . Dyspnea 02/03/2017  . 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 Kidney transplant status, cadaveric 10/15/2015  . Immunosuppressed status ([place]) 10/15/2015  . Paronychia of third finger of right hand 07/16/2011  . ESRD (end stage renal disease) ([place]) 07/16/2011    Past Surgical History:  Procedure Laterality Date  . ABDOMINAL AORTOGRAM W/LOWER EXTREMITY N/A 10/12/2019   Procedure: ABDOMINAL AORTOGRAM W/LOWER EXTREMITY;  Surgeon: Angelia Mould, MD;  Location: [facility];  Service: Cardiovascular;  Laterality: N/A;  With CO2  . access proceds    . achlles tendon repair    . AMPUTATION Right  05/22/2020   Procedure: RIGHT INDEX FINGER DEBRIDEMENT AND AMPUTATION;  Surgeon: Leanora Cover, MD;  Location: [facility];  Service: Orthopedics;  Laterality: Right;  . ARTERIOVENOUS GRAFT PLACEMENT    . bowel repla     repair  . ENDOVENOUS ABLATION SAPHENOUS VEIN W/ LASER Right 10/04/2019   endovenous laser ablation right greater saphenous vein by Gae Gallop MD   . [place] and 2005  . LIGATION GORETEX GRAFT  06/30/11   Right upper arm       Family History  Problem Relation Age of Onset  . Heart disease Mother   . Heart attack Father   . Stroke Sister     Social History   Tobacco Use  . Smoking status: Never Smoker  . Smokeless tobacco: Never Used  Vaping Use  . Vaping Use: Never used  Substance Use Topics  . Alcohol use: No    Alcohol/week: 0.0 standard drinks  . Drug use: No    Home Medications Prior to Admission medications   Medication Sig Start Date End Date Taking? Authorizing Provider  acetaminophen (TYLENOL) 500 MG tablet Take 1,000 mg by mouth every 6 (six) hours as needed for mild pain or moderate pain.   Yes [provider]  allopurinol (ZYLOPRIM) 100 MG tablet Take 1 tablet (100 mg total) by mouth daily. 06/06/20 06/06/21 Yes Florencia Reasons, MD  aspirin 81 MG tablet Take 162 mg by mouth 2 (two) times daily.    Yes [provider]  atorvastatin (LIPITOR) 10 MG tablet Take 10 mg by mouth daily.   Yes [provider]  calcitRIOL (ROCALTROL) 0.25 MCG capsule Take 0.25 mcg by mouth daily.  07/22/15  Yes [provider]  carvedilol (COREG) 6.25 MG tablet Take 1 tablet (6.25 mg total) by mouth 2 (two) times daily with a meal. 01/29/20  Yes Burtis Junes, NP  diphenhydrAMINE (BENADRYL) 25 mg capsule Take 1 capsule (25 mg total) by mouth at bedtime as needed (for sleep/allergies.). 09/11/18  Yes Regalado, Belkys A, MD  doxycycline (VIBRAMYCIN) 100 MG capsule Take 100 mg by mouth 2 (two) times daily. 10 DS  08/22/20  Yes [provider]  furosemide (LASIX) 40 MG tablet Take 1 tablet (40 mg total) by mouth daily. Patient taking differently: Take 80 mg by mouth daily.  06/06/20  Yes Florencia Reasons, MD  gabapentin (NEURONTIN) 100 MG capsule Take 100 mg by mouth See admin instructions. Take 1 capule (100mg ) three times/day as needed for neuropathy or 1 capsule (100mg ) at bedtime   Yes [provider]  HUMALOG KWIKPEN 100 UNIT/ML KwikPen Inject 3-5 Units into the skin 3 (three) times daily. Sliding scale 07/17/18  Yes [provider]  hydrALAZINE (APRESOLINE) 25 MG tablet Take 25 mg by mouth 3 (three) times daily.  08/22/18  Yes [provider]  icosapent Ethyl (VASCEPA) 1 g capsule Take 2 g by mouth 2 (two) times daily.   Yes [provider]  LANTUS SOLOSTAR 100 UNIT/ML Solostar Pen Inject 7-15 Units into the skin daily. If blood sugars are >300, use 15 units, <300 use 7 units 06/04/14  Yes [provider]  latanoprost (XALATAN) 0.005 % ophthalmic solution Place 1 drop into both eyes at bedtime. 07/28/18  Yes [provider]  linagliptin (TRADJENTA) 5 MG TABS tablet Take 5 mg by mouth daily.     Yes [provider]  mycophenolate (CELLCEPT) 500 MG tablet Take 500 mg by mouth 2 (two) times daily.    Yes [provider]  oxyCODONE-acetaminophen (PERCOCET/ROXICET) 5-325 MG tablet Take 1-2 tablets by mouth every 6 (six) hours as needed for moderate pain.  05/23/20  Yes [provider]  predniSONE (DELTASONE) 5 MG tablet Take 5 mg by mouth daily. 07/07/18  Yes [provider]  tacrolimus (PROGRAF) 1 MG capsule Take 1 mg by mouth 2 (two) times daily.    Yes [provider]  Alcohol Swabs (B-D SINGLE USE SWABS REGULAR) PADS Use as directed 09/07/15   [provider]  BAYER CONTOUR NEXT TEST test strip Use as directed 09/12/15   [provider]  BD PEN NEEDLE NANO U/F 32G X 4 MM MISC Use as directed  09/25/15   [provider]  [place] Use as directed 07/14/15   [provider]  senna-docusate (SENOKOT-S) 8.6-50 MG tablet Take 1 tablet by mouth at bedtime. Patient not taking: Reported on 08/26/2020 06/06/20   Florencia Reasons, MD    Allergies    Elavil [amitriptyline hcl]  Review of Systems   Review of Systems  Unable to perform ROS: Acuity of condition  Constitutional: Positive for fever.  Cardiovascular: Negative for chest pain.  Gastrointestinal: Negative for abdominal distention.  pt answering some questions but not all  Physical Exam Updated Vital Signs BP (!) 224/89   Pulse 86   Temp (!) 102.8 F (39.3 C) (Rectal)   Resp 20   SpO2 96%   Physical Exam Vitals and nursing note reviewed.  Exam conducted with a chaperone present.  Constitutional:      General: He is in acute distress.     Appearance: Normal appearance. He is ill-appearing.  HENT:     Head: Normocephalic and atraumatic.     Nose: No rhinorrhea.  Eyes:     General:        Right eye: No discharge.        Left eye: No discharge.     Conjunctiva/sclera: Conjunctivae normal.  Cardiovascular:     Rate and Rhythm: Tachycardia present. Rhythm irregular.  Pulmonary:     Effort: Pulmonary effort is normal.     Breath sounds: No stridor.  Abdominal:     General: Abdomen is flat. There is distension.     Palpations: Abdomen is soft.     Tenderness: There is no abdominal tenderness. There is no guarding.  Musculoskeletal:        General: No deformity or signs of injury.     Comments: TenderSmall ulceration about the lateral aspect of the left fifth toe, surrounding erythema of the distal foot is to palpation decreased range of motion intact pulses  Skin:    General: Skin is warm and dry.  Neurological:     General: No focal deficit present.     Mental Status: He is alert. Mental status is at baseline.     Motor: No weakness.  Psychiatric:        Mood and Affect: Mood normal.         Behavior: Behavior normal.        Thought Content: Thought content normal.     ED Results / Procedures / Treatments   Labs (all labs ordered are listed, but only abnormal results are displayed) Labs Reviewed  COMPREHENSIVE METABOLIC PANEL - Abnormal; Notable for the following components:      Result Value   Chloride 116 (*)    CO2 17 (*)    Glucose, Bld 232 (*)    BUN 32 (*)    Creatinine, Ser 1.85 (*)    Total Bilirubin 1.4 (*)    GFR, Estimated 38 (*)    All other components within normal limits  CBC WITH DIFFERENTIAL/PLATELET - Abnormal; Notable for the following components:   WBC 10.7 (*)    RBC 3.90 (*)    Hemoglobin 10.9 (*)    HCT 37.6 (*)    MCHC 29.0 (*)    Platelets 120 (*)    Neutro Abs 8.5 (*)    All other components within normal limits  PROTIME-INR - Abnormal; Notable for the following components:   Prothrombin Time 15.3 (*)    INR 1.3 (*)    All other components within normal limits  APTT - Abnormal; Notable for the following components:   aPTT 39 (*)    All other components within normal limits  URINALYSIS, ROUTINE W REFLEX MICROSCOPIC - Abnormal; Notable for the following components:   Glucose, UA 150 (*)    Hgb urine dipstick SMALL (*)    Ketones, ur 5 (*)    Protein, ur >=300 (*)    Bacteria, UA RARE (*)    All other components within normal limits  BRAIN NATRIURETIC PEPTIDE - Abnormal; Notable for the following components:   B Natriuretic Peptide 532.0 (*)    All other components within normal limits  BLOOD GAS, VENOUS - Abnormal; Notable for the following components:   pCO2, Ven 36.4 (*)    Bicarbonate 18.7 (*)    Acid-base  deficit 6.1 (*)    All other components within normal limits  CBG MONITORING, ED - Abnormal; Notable for the following components:   Glucose-Capillary 206 (*)    All other components within normal limits  RESP PANEL BY RT-PCR (FLU A&B, COVID) ARPGX2  CULTURE, BLOOD (ROUTINE X 2)  CULTURE, BLOOD (ROUTINE X 2)  URINE  CULTURE  LACTIC ACID, PLASMA    EKG None  Radiology DG Chest Port 1 View  Result Date: 08/26/2020 CLINICAL DATA:  Soft tissue ulceration of the foot EXAM: PORTABLE CHEST 1 VIEW COMPARISON:  05/29/2020 FINDINGS: Cardiomegaly. Mild, diffuse interstitial pulmonary opacity. Osseous structures are unremarkable. IMPRESSION: Cardiomegaly and mild, diffuse interstitial pulmonary opacity, likely edema. No focal airspace opacity. Electronically Signed   By: Eddie Candle M.D.   On: 08/26/2020 16:51   DG Foot Complete Left  Result Date: 08/26/2020 CLINICAL DATA:  Soft tissue ulcer of the fifth digit. EXAM: LEFT FOOT - COMPLETE 3+ VIEW COMPARISON:  None. FINDINGS: Extensive microvascular calcifications are present, compatible with diabetes. Soft swelling is present about the fifth MTP joint. No rows of changes are present. No gas is evident within the soft tissues. No other acute or focal osseous abnormalities are present. IMPRESSION: 1. Soft tissue swelling about the fifth MTP joint without underlying osseous abnormality. 2. No radiopaque foreign body or gas in the soft tissues. 3. Extensive microvascular calcifications compatible with diabetes. Electronically Signed   By: San Morelle M.D.   On: 08/26/2020 16:52    Procedures Ultrasound ED Peripheral IV (Provider)  Date/Time: 08/26/2020 4:11 PM Performed by: Breck Coons, MD Authorized by: Breck Coons, MD   Procedure details:    Indications: poor IV access     Skin Prep: chlorhexidine gluconate     Location:  Left AC   Angiocath:  20 G   Bedside Ultrasound Guided: Yes     Images: archived     Patient tolerated procedure without complications: Yes     Dressing applied: Yes    .Critical Care Performed by: Breck Coons, MD Authorized by: Breck Coons, MD   Critical care provider statement:    Critical care time (minutes):  45   Critical care was necessary to treat or prevent imminent or life-threatening deterioration of the  following conditions:  Cardiac failure and circulatory failure   Critical care was time spent personally by me on the following activities:  Discussions with consultants, evaluation of patient's response to treatment, examination of patient, ordering and performing treatments and interventions, ordering and review of laboratory studies, ordering and review of radiographic studies, pulse oximetry, re-evaluation of patient's condition, obtaining history from patient or surrogate and review of old charts   (including critical care time)  Medications Ordered in ED Medications  metroNIDAZOLE (FLAGYL) tablet 500 mg (500 mg Oral Given 08/26/20 1742)  nitroGLYCERIN 50 mg in dextrose 5 % 250 mL (0.2 mg/mL) infusion (has no administration in time range)  ceFEPIme (MAXIPIME) 2 g in sodium chloride 0.9 % 100 mL IVPB (0 g Intravenous Stopped 08/26/20 1631)  vancomycin (VANCOCIN) IVPB 1000 mg/200 mL premix (1,000 mg Intravenous New Bag/Given 08/26/20 1617)  lactated ringers bolus 500 mL (500 mLs Intravenous New Bag/Given 08/26/20 1617)  furosemide (LASIX) injection 40 mg (40 mg Intravenous Given 08/26/20 1816)    ED Course  I have reviewed the triage vital signs and the nursing notes.  Pertinent labs & imaging results that were available during my care of the patient were reviewed by me  and considered in my medical decision making (see chart for details).    MDM Rules/Calculators/A&P                          Fever coming from home, patient unable to give detailed history however says he is not in pain, patient is intermittently moaning opens eyes follows commands, is overall appearing to be in acute distress without any focal source however it does appear to have some skin and soft tissue changes in the left lower extremity concerning for possible decubitus ulcer seeding osteomyelitis versus cellulitis.  Has a history of heart failure, takes home diuretics does not appear volume overloaded, normal respiratory  status here but on 2 L, not on oxygen at home.  Satting 99%, I was told he was placed on oxygen because his O2 sat was 94%.  He is febrile here.  Cultures will be sent antibiotics will be given.  Concern on chest x-ray imaging for radiology my review shows interstitial edema, in the setting of hypertension possibly hypertensive at emergency causing pulmonary edema, patient did get a 500 cc bolus at that time did not feel he looked volume overload in his extremities no JVD, however with the respiratory difficulties we will cut fluid and diurese the patient.  The initial concern was for sepsis, there is no bacterial source identified right now.  Blood cultures are sent and antibiotics were given.  CT scan of the head was without acute intracranial abnormality after my review.  Other laboratory studies fairly unremarkable at this time.  He will need admission he will get nitroglycerin infusion to lower blood pressure.  BNP mildly elevated at 500.  CRITICAL CARE Performed by: Breck Coons   Total critical care time: 45 minutes  Critical care time was exclusive of separately billable procedures and treating other patients.  Critical care was necessary to treat or prevent imminent or life-threatening deterioration.  Critical care was time spent personally by me on the following activities: development of treatment plan with patient and/or surrogate as well as nursing, discussions with consultants, evaluation of patient's response to treatment, examination of patient, obtaining history from patient or surrogate, ordering and performing treatments and interventions, ordering and review of laboratory studies, ordering and review of radiographic studies, pulse oximetry and re-evaluation of patient's condition.   Final Clinical Impression(s) / ED Diagnoses Final diagnoses:  Fever in adult  Hypertensive emergency  Acute pulmonary edema [facility])    Rx / DC Orders ED Discharge Orders    None       Breck Coons, MD 08/26/20 1820

## 2020-08-26 NOTE — Progress Notes (Signed)
Following for code sepsis 

## 2020-08-27 ENCOUNTER — Inpatient Hospital Stay (HOSPITAL_COMMUNITY): Payer: Medicare Other

## 2020-08-27 DIAGNOSIS — E118 Type 2 diabetes mellitus with unspecified complications: Secondary | ICD-10-CM

## 2020-08-27 DIAGNOSIS — D649 Anemia, unspecified: Secondary | ICD-10-CM | POA: Diagnosis present

## 2020-08-27 DIAGNOSIS — E1121 Type 2 diabetes mellitus with diabetic nephropathy: Secondary | ICD-10-CM | POA: Diagnosis present

## 2020-08-27 DIAGNOSIS — N1832 Chronic kidney disease, stage 3b: Secondary | ICD-10-CM | POA: Diagnosis present

## 2020-08-27 DIAGNOSIS — Z940 Kidney transplant status: Secondary | ICD-10-CM

## 2020-08-27 DIAGNOSIS — E0821 Diabetes mellitus due to underlying condition with diabetic nephropathy: Secondary | ICD-10-CM

## 2020-08-27 DIAGNOSIS — I161 Hypertensive emergency: Secondary | ICD-10-CM | POA: Diagnosis present

## 2020-08-27 DIAGNOSIS — D598 Other acquired hemolytic anemias: Secondary | ICD-10-CM

## 2020-08-27 DIAGNOSIS — I10 Essential (primary) hypertension: Secondary | ICD-10-CM

## 2020-08-27 DIAGNOSIS — L8990 Pressure ulcer of unspecified site, unspecified stage: Secondary | ICD-10-CM | POA: Insufficient documentation

## 2020-08-27 LAB — COMPREHENSIVE METABOLIC PANEL
ALT: 10 U/L (ref 0–44)
AST: 25 U/L (ref 15–41)
Albumin: 3.4 g/dL — ABNORMAL LOW (ref 3.5–5.0)
Alkaline Phosphatase: 83 U/L (ref 38–126)
Anion gap: 13 (ref 5–15)
BUN: 37 mg/dL — ABNORMAL HIGH (ref 8–23)
CO2: 16 mmol/L — ABNORMAL LOW (ref 22–32)
Calcium: 9.6 mg/dL (ref 8.9–10.3)
Chloride: 116 mmol/L — ABNORMAL HIGH (ref 98–111)
Creatinine, Ser: 2.09 mg/dL — ABNORMAL HIGH (ref 0.61–1.24)
GFR, Estimated: 33 mL/min — ABNORMAL LOW (ref >60)
Glucose, Bld: 241 mg/dL — ABNORMAL HIGH (ref 70–99)
Potassium: 4.1 mmol/L (ref 3.5–5.1)
Sodium: 145 mmol/L (ref 135–145)
Total Bilirubin: 1.1 mg/dL (ref 0.3–1.2)
Total Protein: 5.9 g/dL — ABNORMAL LOW (ref 6.5–8.1)

## 2020-08-27 LAB — BASIC METABOLIC PANEL
Anion gap: 13 (ref 5–15)
BUN: 37 mg/dL — ABNORMAL HIGH (ref 8–23)
CO2: 18 mmol/L — ABNORMAL LOW (ref 22–32)
Calcium: 9.5 mg/dL (ref 8.9–10.3)
Chloride: 115 mmol/L — ABNORMAL HIGH (ref 98–111)
Creatinine, Ser: 1.92 mg/dL — ABNORMAL HIGH (ref 0.61–1.24)
GFR, Estimated: 36 mL/min — ABNORMAL LOW (ref >60)
Glucose, Bld: 243 mg/dL — ABNORMAL HIGH (ref 70–99)
Potassium: 4.1 mmol/L (ref 3.5–5.1)
Sodium: 146 mmol/L — ABNORMAL HIGH (ref 135–145)

## 2020-08-27 LAB — CBC
HCT: 33.3 % — ABNORMAL LOW (ref 39.0–52.0)
Hemoglobin: 9.8 g/dL — ABNORMAL LOW (ref 13.0–17.0)
MCH: 28.2 pg (ref 26.0–34.0)
MCHC: 29.4 g/dL — ABNORMAL LOW (ref 30.0–36.0)
MCV: 96 fL (ref 80.0–100.0)
Platelets: 102 10*3/uL — ABNORMAL LOW (ref 150–400)
RBC: 3.47 MIL/uL — ABNORMAL LOW (ref 4.22–5.81)
RDW: 14.9 % (ref 11.5–15.5)
WBC: 8 10*3/uL (ref 4.0–10.5)
nRBC: 0 % (ref 0.0–0.2)

## 2020-08-27 LAB — RPR: RPR Ser Ql: NONREACTIVE

## 2020-08-27 LAB — LACTIC ACID, PLASMA
Lactic Acid, Venous: 1.7 mmol/L (ref 0.5–1.9)
Lactic Acid, Venous: 2.1 mmol/L (ref 0.5–1.9)

## 2020-08-27 LAB — TROPONIN I (HIGH SENSITIVITY)
Troponin I (High Sensitivity): 97 ng/L — ABNORMAL HIGH (ref <18)
Troponin I (High Sensitivity): 88 ng/L — ABNORMAL HIGH (ref <18)
Troponin I (High Sensitivity): 95 ng/L — ABNORMAL HIGH (ref <18)

## 2020-08-27 LAB — CBG MONITORING, ED
Glucose-Capillary: 234 mg/dL — ABNORMAL HIGH (ref 70–99)
Glucose-Capillary: 250 mg/dL — ABNORMAL HIGH (ref 70–99)
Glucose-Capillary: 235 mg/dL — ABNORMAL HIGH (ref 70–99)

## 2020-08-27 LAB — HEMOGLOBIN A1C
Hgb A1c MFr Bld: 6.6 % — ABNORMAL HIGH (ref 4.8–5.6)
Mean Plasma Glucose: 142.72 mg/dL

## 2020-08-27 LAB — TYPE AND SCREEN
ABO/RH(D): O POS
Antibody Screen: NEGATIVE

## 2020-08-27 LAB — PHOSPHORUS: Phosphorus: 2.6 mg/dL (ref 2.5–4.6)

## 2020-08-27 LAB — GLUCOSE, CAPILLARY
Glucose-Capillary: 167 mg/dL — ABNORMAL HIGH (ref 70–99)
Glucose-Capillary: 139 mg/dL — ABNORMAL HIGH (ref 70–99)

## 2020-08-27 LAB — PROTIME-INR
INR: 1.3 — ABNORMAL HIGH (ref 0.8–1.2)
Prothrombin Time: 15.7 seconds — ABNORMAL HIGH (ref 11.4–15.2)

## 2020-08-27 LAB — MRSA PCR SCREENING: MRSA by PCR: NEGATIVE

## 2020-08-27 LAB — MAGNESIUM: Magnesium: 1.7 mg/dL (ref 1.7–2.4)

## 2020-08-27 LAB — HIV ANTIBODY (ROUTINE TESTING W REFLEX): HIV Screen 4th Generation wRfx: NONREACTIVE

## 2020-08-27 LAB — PROCALCITONIN: Procalcitonin: 2.6 ng/mL

## 2020-08-27 MED ORDER — NITROGLYCERIN 0.4 MG SL SUBL
0.4000 mg | SUBLINGUAL_TABLET | SUBLINGUAL | Status: DC | PRN
  Administered 2020-08-27: 0.4 mg via SUBLINGUAL
  Filled 2020-08-27: qty 1

## 2020-08-27 MED ORDER — ORAL CARE MOUTH RINSE
15.0000 mL | Freq: Two times a day (BID) | OROMUCOSAL | Status: DC
  Administered 2020-08-27 – 2020-09-11 (×24): 15 mL via OROMUCOSAL

## 2020-08-27 MED ORDER — INSULIN GLARGINE 100 UNIT/ML ~~LOC~~ SOLN
5.0000 [IU] | Freq: Every day | SUBCUTANEOUS | Status: DC
  Administered 2020-08-27 – 2020-08-30 (×4): 5 [IU] via SUBCUTANEOUS
  Filled 2020-08-27 (×4): qty 0.05

## 2020-08-27 MED ORDER — CHLORHEXIDINE GLUCONATE CLOTH 2 % EX PADS
6.0000 | MEDICATED_PAD | Freq: Every day | CUTANEOUS | Status: DC
  Administered 2020-08-27 – 2020-09-11 (×13): 6 via TOPICAL

## 2020-08-27 MED ORDER — LORAZEPAM 2 MG/ML IJ SOLN
1.0000 mg | Freq: Once | INTRAMUSCULAR | Status: AC
  Administered 2020-08-27: 1 mg via INTRAVENOUS
  Filled 2020-08-27: qty 1

## 2020-08-27 MED ORDER — CARVEDILOL 12.5 MG PO TABS
12.5000 mg | ORAL_TABLET | Freq: Two times a day (BID) | ORAL | Status: DC
  Administered 2020-08-27 – 2020-08-28 (×3): 12.5 mg via ORAL
  Filled 2020-08-27 (×3): qty 1

## 2020-08-27 NOTE — ED Notes (Signed)
Patient linens changed and cleaned up by this RN and tech Hailey. Male purewick placed on pt and pt repositioned in bed. Pt alert and stating that he "feels better" this morning. Will continue to monitor.

## 2020-08-27 NOTE — Progress Notes (Signed)
Inpatient Diabetes Program Recommendations  AACE/ADA: New Consensus Statement on Inpatient Glycemic Control (2015)  Target Ranges:  Prepandial:   less than 140 mg/dL      Peak postprandial:   less than 180 mg/dL (1-2 hours)      Critically ill patients:  140 - 180 mg/dL   Lab Results  Component Value Date   GLUCAP 250 (H) 08/27/2020   HGBA1C 6.9 (H) 06/02/2020    Review of Glycemic Control Results for [name], [name] (MRN [id number]) as of 08/27/2020 10:29  Ref. Range 08/26/2020 15:32 08/26/2020 22:56 08/27/2020 04:50 08/27/2020 07:50  Glucose-Capillary Latest Ref Range: 70 - 99 mg/dL 206 (H) 210 (H) 234 (H) 250 (H)   Diabetes history: DM2 Outpatient Diabetes medications: Lantus qd (15 units if CBG >300 & 7 units if CBG <300) + Humalog 3-5 units tid meal coverage + Tradjenta 5 mg qd Current orders for Inpatient glycemic control: Novolog 3 units tid meal coverage tid + Novolog sensitive correction 0-9 units tid + hs 0-5 units + Tradjenta 5 units + Solucortef 50 mg q 12 hrs.  Inpatient Diabetes Program Recommendations:   Consider: Lantus 8 units daily Secure chat sent to Dr. Sherral Hammers.  Thank you, Nani Gasser. Michelyn Scullin, RN, MSN, CDE  Diabetes Coordinator Inpatient Glycemic Control Team Team Pager [phone] (8am-5pm) 08/27/2020 10:34 AM

## 2020-08-27 NOTE — Progress Notes (Signed)
Failed attempt at MRI. Pt unable to come off pump and RN unable to come down at this time. Spoke with RN and is able to be done tomorrow.

## 2020-08-27 NOTE — Progress Notes (Signed)
Failed attempt at MRI. RN unsure if pt can be disconnected from monitor/pumps. Will try later.

## 2020-08-27 NOTE — Progress Notes (Signed)
PROGRESS NOTE    [name]  MHD:[id number] DOB: 1946-01-29 DOA: 08/26/2020 PCP: [name], MD     Brief Narrative:   [name] is a 74 y.o. WM PMHx Renal transplant, CKD stage IIIb , Chronic Diastolic CHF, essential HTN, DM type II controlled, with complication, DM nephropathy,  chronic left foot wound,   Presenting to the emergency department with confusion.  Patient had reportedly been complaining of some left foot pain and nausea last night but otherwise seem to be in his usual state when his wife left the house today but she was later called by a home health worker who reported that the patient was confused.  He was able to open the door for the home health agent but unable to answer any of her basic questions.  Patient is unable to contribute to the history.   ED Course: Upon arrival to the ED, patient is found to be febrile to 39.3 C, saturating 98% on 2 L/min of supplemental oxygen, and hypertensive with systolic pressure as high as 237.  EKG features sinus tachycardia with rate 105.  Noncontrast head CT negative for acute intracranial abnormality.  Chest x-ray notable for cardiomegaly with mild diffuse interstitial edema.  Radiographs of the left foot demonstrate soft tissue swelling about the left fifth MTP without acute underlying bony abnormality.  No acute findings on CT of the abdomen and pelvis.  Chemistry panel features a glucose of 232, bicarbonate 17, and creatinine 1.85.  CBC notable for mild leukocytosis and thrombocytopenia, and a improved normocytic anemia.  Lactic acid is 1.9.  BNP 532.  Blood and urine cultures were collected in the emergency department patient was given 500 cc of saline, 40 mg IV Lasix, vancomycin, cefepime, and Flagyl.  He was started on nitroglycerin infusion for blood pressure.  COVID-19 pcr is negative.   Subjective: T-max overnight 38.3 C, patient bedside patient having chest pain.  However when I arrived patient no longer having  chest pain (patient had been hypertensive and off of his NTG drip during transfer from ED secondary to losing IV line).  Now back on NTG drip   Assessment & Plan: Covid vaccination; vaccinated   Principal Problem:   Sepsis due to cellulitis [facility]) Active Problems:   Kidney transplant status, cadaveric   Chronic diastolic heart failure ([place])   Essential hypertension   Acute encephalopathy   Anemia due to chronic kidney disease   Diabetes mellitus type 2, insulin dependent ([facility])   Chronic kidney disease, stage 3b ([place])   Hypertensive emergency   Anemia, unspecified   Chronic renal failure, stage 3b ([place])   Renal transplant, status post   Diabetes mellitus type 2, controlled, with complications ([place])   Diabetic nephropathy ([place])  Sepsis/osteomyelitis LEFT fifth metatarsal -Upon admission patient met criteria for sepsis temp > 38 C, RR> 20, point of infection cellulitis bilateral lower extremity and possible osteomyelitis of LEFT fifth metatarsal  -Continue current antibiotic treatment -Blood cultures pending -MRI LEFT foot pending -Lactic acid/procalcitonin pending we will used to determine narrowing of antibiotic therapy.   Acute encephalopathy  -Head CT negative and no focal neuro deficits identified  -Not convinced patient encephalopathic.  Patient appears to be hard of hearing as well as a little bit slow however if you speak loudly and clearly and give him a few minutes to think he answers appropriately. -Tacrolimus level pending -RPR pending -Ammonia/TSH/B12 pending  Renal transplant/CKD stage IIIb (baseline Cr ~1.85) -Renally dose all medications -Hold all renal toxic medication -  CellCept 500 mg BID -Prednisone 5 mg daily -Tacrolimus 1 mg BID  Anemia (baseline HgB 10.9) -Most likely secondary to CKD, anemia of chronic disease? -Anemia panel pending -Haptoglobin/LDH pending -Fecal occult blood pending  Hypertensive emergency -BP> 180 with chest pain, possible  ischemia -Continue nitroglycerin drip -12/8 increase Coreg 12.5 mg BID -Lasix 80 mg daily -Hydralazine 25 mg TID -Strict ins and outs -Daily weight  Chronic Diastolic CHF -Patient asked uncontrolled HTN DC Solu-Cortef -September 2021 EF 55 to 60%  Chest pain -See hypertensive emergency  DM type II controlled with complication/DM nephropathy -12/8 hemoglobin A1c= 6.6 -Lantus 5 units daily -NovoLog 3 units qac -Sensitive SSI -Tradjenta 5 mg daily  Gout -Uric acid pending -Other than his LEFT FIFTH METATARSAL NO JOINT PAIN. -Patient on prednisone for his renal transplant if truly a gout flare, may increase.   DVT prophylaxis: Heparin subcu Code Status: Full Family Communication:  Status is: Inpatient    Dispo: The patient is from: Home              Anticipated d/c is to: Home              Anticipated d/c date is: 12/12              Patient currently unstable      Consultants:    Procedures/Significant Events:    I have personally reviewed and interpreted all radiology studies and my findings are as above.  VENTILATOR SETTINGS:    Cultures   Antimicrobials: Anti-infectives (From admission, onward)   Start     Dose/Rate Route Frequency Ordered Stop   08/26/20 2359  cefTRIAXone (ROCEPHIN) 1 g in sodium chloride 0.9 % 100 mL IVPB        1 g 200 mL/hr over 30 Minutes Intravenous Every 24 hours 08/26/20 2028     08/26/20 1545  ceFEPIme (MAXIPIME) 2 g in sodium chloride 0.9 % 100 mL IVPB        2 g 200 mL/hr over 30 Minutes Intravenous  Once 08/26/20 1532 08/26/20 1631   08/26/20 1545  metroNIDAZOLE (FLAGYL) tablet 500 mg  Status:  Discontinued        500 mg Oral Every 8 hours 08/26/20 1532 08/26/20 2015   08/26/20 1545  vancomycin (VANCOCIN) IVPB 1000 mg/200 mL premix        1,000 mg 200 mL/hr over 60 Minutes Intravenous  Once 08/26/20 1532 08/26/20 1823       Devices    LINES / TUBES:      Continuous Infusions: . sodium chloride    .  cefTRIAXone (ROCEPHIN)  IV Stopped (08/27/20 0035)  . nitroGLYCERIN 20 mcg/min (08/27/20 1500)     Objective: Vitals:   08/27/20 1430 08/27/20 1445 08/27/20 1500 08/27/20 1515  BP:  121/65 (!) 146/92   Pulse: 91 (!) 41 92 96  Resp: (!) 22 (!) 22 (!) 21 20  Temp:      TempSrc:      SpO2: 100% 97% 96% 95%    Intake/Output Summary (Last 24 hours) at 08/27/2020 1702 Last data filed at 08/27/2020 1500 Gross per 24 hour  Intake 2892.28 ml  Output --  Net 2892.28 ml   There were no vitals filed for this visit.  Examination:  General: A/O x4, No acute respiratory distress Eyes: negative scleral hemorrhage, negative anisocoria, negative icterus ENT: Negative Runny nose, negative gingival bleeding, Neck:  Negative scars, masses, torticollis, lymphadenopathy, JVD Lungs: Clear to auscultation bilaterally without wheezes or crackles  Cardiovascular: Regular rate and rhythm without murmur gallop or rub normal S1 and S2 Abdomen: negative abdominal pain, nondistended, positive soft, bowel sounds, no rebound, no ascites, no appreciable mass Extremities: patient with multiple ulcers on bilateral lower extremities, RIGHT>>> LEFT.  Left fifth metatarsal appears to have lesion to the bone, warm to touch painful to palpation Skin: See extremities Psychiatric:  Negative depression, negative anxiety, negative fatigue, negative mania  Central nervous system:  Cranial nerves II through XII intact, tongue/uvula midline, all extremities muscle strength 5/5, sensation intact throughout, negative dysarthria, negative expressive aphasia, negative receptive aphasia.  .     Data Reviewed: Care during the described time interval was provided by me .  I have reviewed this patient's available data, including medical history, events of note, physical examination, and all test results as part of my evaluation.  CBC: Recent Labs  Lab 08/26/20 1530 08/27/20 1227  WBC 10.7* 8.0  NEUTROABS 8.5*  --   HGB  10.9* 9.8*  HCT 37.6* 33.3*  MCV 96.4 96.0  PLT 120* 628*   Basic Metabolic Panel: Recent Labs  Lab 08/26/20 1530 08/27/20 1227  NA 145 145  146*  K 4.7 4.1  4.1  CL 116* 116*  115*  CO2 17* 16*  18*  GLUCOSE 232* 241*  243*  BUN 32* 37*  37*  CREATININE 1.85* 2.09*  1.92*  CALCIUM 9.8 9.6  9.5  MG  --  1.7  PHOS  --  2.6   GFR: CrCl cannot be calculated (Unknown ideal weight.). Liver Function Tests: Recent Labs  Lab 08/26/20 1530 08/27/20 1227  AST 28 25  ALT 11 10  ALKPHOS 95 83  BILITOT 1.4* 1.1  PROT 6.5 5.9*  ALBUMIN 4.0 3.4*   No results for input(s): LIPASE, AMYLASE in the last 168 hours. Recent Labs  Lab 08/26/20 2203  AMMONIA 31   Coagulation Profile: Recent Labs  Lab 08/26/20 1530  INR 1.3*   Cardiac Enzymes: No results for input(s): CKTOTAL, CKMB, CKMBINDEX, TROPONINI in the last 168 hours. BNP (last 3 results) No results for input(s): PROBNP in the last 8760 hours. HbA1C: Recent Labs    08/27/20 1227  HGBA1C 6.6*   CBG: Recent Labs  Lab 08/26/20 2256 08/27/20 0450 08/27/20 0750 08/27/20 1129 08/27/20 1620  GLUCAP 210* 234* 250* 235* 167*   Lipid Profile: No results for input(s): CHOL, HDL, LDLCALC, TRIG, CHOLHDL, LDLDIRECT in the last 72 hours. Thyroid Function Tests: Recent Labs    08/26/20 2203  TSH 2.205   Anemia Panel: Recent Labs    08/26/20 2203 08/26/20 2205  VITAMINB12 347  --   FOLATE  --  11.8   Sepsis Labs: Recent Labs  Lab 08/26/20 1530  LATICACIDVEN 1.9    Recent Results (from the past 240 hour(s))  Blood Culture (routine x 2)     Status: None (Preliminary result)   Collection Time: 08/26/20  3:30 PM   Specimen: BLOOD LEFT HAND  Result Value Ref Range Status   Specimen Description   Final    BLOOD LEFT HAND Performed at [facility] [address], [place], [place] [address]    Special Requests   Final    BOTTLES DRAWN AEROBIC AND ANAEROBIC Blood Culture adequate  volume Performed at [facility] [address] [place], [place] [address]    Culture   Final    NO GROWTH < 12 HOURS Performed at [facility] [place],  [place] [address]    Report Status PENDING  Incomplete  Blood Culture (routine x 2)     Status: None (Preliminary result)   Collection Time: 08/26/20  3:30 PM   Specimen: BLOOD  Result Value Ref Range Status   Specimen Description   Final    BLOOD RIGHT ARM Performed at [facility] [address], [place], [place] [address]    Special Requests   Final    BOTTLES DRAWN AEROBIC AND ANAEROBIC Blood Culture results may not be optimal due to an inadequate volume of blood received in culture bottles Performed at [facility] [address], [place], [place] [address]    Culture   Final    NO GROWTH < 12 HOURS Performed at [facility] [address], [place], [place] [address]    Report Status PENDING  Incomplete  Resp Panel by RT-PCR (Flu A&B, Covid) In/Out Cath Urine     Status: None   Collection Time: 08/26/20  4:30 PM   Specimen: In/Out Cath Urine; Nasopharyngeal(NP) swabs in vial transport medium  Result Value Ref Range Status   SARS Coronavirus 2 by RT PCR NEGATIVE NEGATIVE Final    Comment: (NOTE) SARS-CoV-2 target nucleic acids are NOT DETECTED.  The SARS-CoV-2 RNA is generally detectable in upper respiratory specimens during the acute phase of infection. The lowest concentration of SARS-CoV-2 viral copies this assay can detect is 138 copies/mL. A negative result does not preclude SARS-Cov-2 infection and should not be used as the sole basis for treatment or other patient management decisions. A negative result may occur with  improper specimen collection/handling, submission of specimen other than nasopharyngeal swab, presence of viral mutation(s) within the areas targeted by this assay, and inadequate number of viral copies(<138  copies/mL). A negative result must be combined with clinical observations, patient history, and epidemiological information. The expected result is Negative.  Fact Sheet for Patients:  EntrepreneurPulse.com.au  Fact Sheet for Healthcare Providers:  IncredibleEmployment.be  This test is no t yet approved or cleared by the [place] FDA and  has been authorized for detection and/or diagnosis of SARS-CoV-2 by FDA under an Emergency Use Authorization (EUA). This EUA will remain  in effect (meaning this test can be used) for the duration of the COVID-19 declaration under Section 564(b)(1) of the Act, 21 U.S.C.section 360bbb-3(b)(1), unless the authorization is terminated  or revoked sooner.       Influenza A by PCR NEGATIVE NEGATIVE Final   Influenza B by PCR NEGATIVE NEGATIVE Final    Comment: (NOTE) The Xpert Xpress SARS-CoV-2/FLU/RSV plus assay is intended as an aid in the diagnosis of influenza from Nasopharyngeal swab specimens and should not be used as a sole basis for treatment. Nasal washings and aspirates are unacceptable for Xpert Xpress SARS-CoV-2/FLU/RSV testing.  Fact Sheet for Patients: EntrepreneurPulse.com.au  Fact Sheet for Healthcare Providers: IncredibleEmployment.be  This test is not yet approved or cleared by the [place] FDA and has been authorized for detection and/or diagnosis of SARS-CoV-2 by FDA under an Emergency Use Authorization (EUA). This EUA will remain in effect (meaning this test can be used) for the duration of the COVID-19 declaration under Section 564(b)(1) of the Act, 21 U.S.C. section 360bbb-3(b)(1), unless the authorization is terminated or revoked.  Performed at [facility], [place] [address], [place], [place] [address]   MRSA PCR Screening     Status: None   Collection Time: 08/27/20 12:22 PM   Specimen: Nasal  Mucosa; Nasopharyngeal   Result Value Ref Range Status   MRSA by PCR NEGATIVE NEGATIVE Final    Comment:        The GeneXpert MRSA Assay (FDA approved for NASAL specimens only), is one component of a comprehensive MRSA colonization surveillance program. It is not intended to diagnose MRSA infection nor to guide or monitor treatment for MRSA infections. Performed at [facility], [place] [address] [place], [place] [address]          Radiology Studies: CT ABDOMEN PELVIS WO CONTRAST  Result Date: 08/26/2020 CLINICAL DATA:  Acute abdominal and back pain, sepsis; history chronic kidney disease, prior renal transplant, CHF, diabetes mellitus, hypertension, atrial fibrillation EXAM: CT ABDOMEN AND PELVIS WITHOUT CONTRAST TECHNIQUE: Multidetector CT imaging of the abdomen and pelvis was performed following the standard protocol without IV contrast. Sagittal and coronal MPR images reconstructed from axial data set. Neither oral nor intravenous contrast were administered. Patient respiratory motion artifacts degrade exam. COMPARISON:  05/26/2020 FINDINGS: Lower chest: Small bibasilar pleural effusions and compressive atelectasis in the RIGHT lower lobe. Pericardial calcification. Enlargement of cardiac chambers. Hepatobiliary: Gallbladder and liver normal appearance Pancreas: Normal appearance Spleen: Normal appearance Adrenals/Urinary Tract: Atrophic native kidneys. Small RIGHT renal cysts. Old calcified renal transplant in RIGHT iliac fossa. Transplant kidney LEFT iliac fossa without mass or hydronephrosis. Decompressed bladder. Stomach/Bowel: Scattered colonic diverticulosis. No definite CT evidence of acute diverticulitis. Stomach and bowel loops otherwise unremarkable. Vascular/Lymphatic: Extensive atherosclerotic calcifications aorta, iliac arteries, and femoral arteries. Additional coronary arterial and extensive small vessel vascular calcifications throughout abdomen and pelvis. Aorta normal caliber.  No adenopathy. Reproductive: Unremarkable prostate gland and seminal vesicles Other: Scattered stranding of tissue planes in abdominal wall, mesentery, and pelvis. No free air or free fluid. No hernia. Musculoskeletal: Diffuse osseous demineralization. IMPRESSION: Small bibasilar pleural effusions and compressive atelectasis in the RIGHT lower lobe. Pericardial calcification. Atrophic native kidneys with old calcified renal transplant in RIGHT iliac fossa. Transplant kidney LEFT iliac fossa without mass or hydronephrosis. Scattered colonic diverticulosis without evidence of diverticulitis. No definite acute intra-abdominal or intrapelvic abnormalities. Aortic Atherosclerosis (ICD10-I70.0). Electronically Signed   By: Lavonia Dana M.D.   On: 08/26/2020 19:41   CT Head Wo Contrast  Result Date: 08/26/2020 CLINICAL DATA:  74 year old male with altered mental status. Atrial fibrillation. Diabetic foot wound. Fever. EXAM: CT HEAD WITHOUT CONTRAST TECHNIQUE: Contiguous axial images were obtained from the base of the skull through the vertex without intravenous contrast. COMPARISON:  Brain MRI 05/28/2020.  Head CT 05/26/2020 and earlier. FINDINGS: Brain: Cerebral volume is stable and within normal limits. No midline shift, ventriculomegaly, mass effect, evidence of mass lesion, intracranial hemorrhage or evidence of cortically based acute infarction. Stable gray-white matter differentiation, within normal limits for age. Vascular: Extensive Calcified atherosclerosis at the skull base. No suspicious intracranial vascular hyperdensity. Dolichoectatic distal right vertebral artery. Skull: No acute osseous abnormality identified. Sinuses/Orbits: Tympanic cavities and mastoids remain clear. Stable mild paranasal sinus mucosal thickening. Other: Calcified scalp vessel atherosclerosis. No acute orbit or scalp soft tissue finding. IMPRESSION: 1. No acute intracranial abnormality. Stable and negative for age non contrast CT  appearance of the brain. 2. Very advanced calcified atherosclerosis. Electronically Signed   By: Genevie Ann M.D.   On: 08/26/2020 18:37   DG Chest Port 1 View  Result Date: 08/26/2020 CLINICAL DATA:  Soft tissue ulceration of the foot EXAM: PORTABLE CHEST 1 VIEW COMPARISON:  05/29/2020 FINDINGS: Cardiomegaly. Mild, diffuse interstitial pulmonary opacity. Osseous structures are unremarkable.  IMPRESSION: Cardiomegaly and mild, diffuse interstitial pulmonary opacity, likely edema. No focal airspace opacity. Electronically Signed   By: Eddie Candle M.D.   On: 08/26/2020 16:51   DG Foot Complete Left  Result Date: 08/26/2020 CLINICAL DATA:  Soft tissue ulcer of the fifth digit. EXAM: LEFT FOOT - COMPLETE 3+ VIEW COMPARISON:  None. FINDINGS: Extensive microvascular calcifications are present, compatible with diabetes. Soft swelling is present about the fifth MTP joint. No rows of changes are present. No gas is evident within the soft tissues. No other acute or focal osseous abnormalities are present. IMPRESSION: 1. Soft tissue swelling about the fifth MTP joint without underlying osseous abnormality. 2. No radiopaque foreign body or gas in the soft tissues. 3. Extensive microvascular calcifications compatible with diabetes. Electronically Signed   By: San Morelle M.D.   On: 08/26/2020 16:52        Scheduled Meds: . allopurinol  100 mg Oral Daily  . aspirin EC  162 mg Oral BID  . atorvastatin  10 mg Oral Daily  . calcitRIOL  0.25 mcg Oral Daily  . carvedilol  12.5 mg Oral BID WC  . Chlorhexidine Gluconate Cloth  6 each Topical Daily  . furosemide  80 mg Oral Daily  . heparin  5,000 Units Subcutaneous Q8H  . hydrALAZINE  25 mg Oral TID  . insulin aspart  0-5 Units Subcutaneous QHS  . insulin aspart  0-9 Units Subcutaneous TID WC  . insulin aspart  3 Units Subcutaneous TID WC  . latanoprost  1 drop Both Eyes QHS  . linagliptin  5 mg Oral Daily  . mouth rinse  15 mL Mouth Rinse BID  .  mycophenolate  500 mg Oral BID  . [START ON 08/30/2020] predniSONE  5 mg Oral Daily  . sodium chloride flush  3 mL Intravenous Q12H  . sodium chloride flush  3 mL Intravenous Q12H  . tacrolimus  1 mg Oral BID   Continuous Infusions: . sodium chloride    . cefTRIAXone (ROCEPHIN)  IV Stopped (08/27/20 0035)  . nitroGLYCERIN 20 mcg/min (08/27/20 1500)     LOS: 1 day    Time spent:40 min    Howard Patton, Geraldo Docker, MD [facility] Pager [phone]  If 7PM-7AM, please contact night-coverage [email or web address] Password [facility] 08/27/2020, 5:02 PM

## 2020-08-28 ENCOUNTER — Inpatient Hospital Stay (HOSPITAL_COMMUNITY): Payer: Medicare Other

## 2020-08-28 LAB — CBC WITH DIFFERENTIAL/PLATELET
Abs Immature Granulocytes: 0.04 10*3/uL (ref 0.00–0.07)
Basophils Absolute: 0 10*3/uL (ref 0.0–0.1)
Basophils Relative: 0 %
Eosinophils Absolute: 0.2 10*3/uL (ref 0.0–0.5)
Eosinophils Relative: 2 %
HCT: 28.5 % — ABNORMAL LOW (ref 39.0–52.0)
Hemoglobin: 8.4 g/dL — ABNORMAL LOW (ref 13.0–17.0)
Immature Granulocytes: 1 %
Lymphocytes Relative: 9 %
Lymphs Abs: 0.6 10*3/uL — ABNORMAL LOW (ref 0.7–4.0)
MCH: 28.5 pg (ref 26.0–34.0)
MCHC: 29.5 g/dL — ABNORMAL LOW (ref 30.0–36.0)
MCV: 96.6 fL (ref 80.0–100.0)
Monocytes Absolute: 0.6 10*3/uL (ref 0.1–1.0)
Monocytes Relative: 9 %
Neutro Abs: 6 10*3/uL (ref 1.7–7.7)
Neutrophils Relative %: 79 %
Platelets: 91 10*3/uL — ABNORMAL LOW (ref 150–400)
RBC: 2.95 MIL/uL — ABNORMAL LOW (ref 4.22–5.81)
RDW: 14.7 % (ref 11.5–15.5)
WBC: 7.5 10*3/uL (ref 4.0–10.5)
nRBC: 0 % (ref 0.0–0.2)

## 2020-08-28 LAB — COMPREHENSIVE METABOLIC PANEL
ALT: 9 U/L (ref 0–44)
AST: 23 U/L (ref 15–41)
Albumin: 2.9 g/dL — ABNORMAL LOW (ref 3.5–5.0)
Alkaline Phosphatase: 61 U/L (ref 38–126)
Anion gap: 11 (ref 5–15)
BUN: 42 mg/dL — ABNORMAL HIGH (ref 8–23)
CO2: 19 mmol/L — ABNORMAL LOW (ref 22–32)
Calcium: 9.1 mg/dL (ref 8.9–10.3)
Chloride: 112 mmol/L — ABNORMAL HIGH (ref 98–111)
Creatinine, Ser: 2.37 mg/dL — ABNORMAL HIGH (ref 0.61–1.24)
GFR, Estimated: 28 mL/min — ABNORMAL LOW (ref >60)
Glucose, Bld: 147 mg/dL — ABNORMAL HIGH (ref 70–99)
Potassium: 3.8 mmol/L (ref 3.5–5.1)
Sodium: 142 mmol/L (ref 135–145)
Total Bilirubin: 1 mg/dL (ref 0.3–1.2)
Total Protein: 5 g/dL — ABNORMAL LOW (ref 6.5–8.1)

## 2020-08-28 LAB — VITAMIN B12: Vitamin B-12: 516 pg/mL (ref 180–914)

## 2020-08-28 LAB — URINE CULTURE: Culture: <10000 — AB

## 2020-08-28 LAB — LACTIC ACID, PLASMA
Lactic Acid, Venous: 0.8 mmol/L (ref 0.5–1.9)
Lactic Acid, Venous: 1.2 mmol/L (ref 0.5–1.9)

## 2020-08-28 LAB — GLUCOSE, CAPILLARY
Glucose-Capillary: 145 mg/dL — ABNORMAL HIGH (ref 70–99)
Glucose-Capillary: 168 mg/dL — ABNORMAL HIGH (ref 70–99)
Glucose-Capillary: 132 mg/dL — ABNORMAL HIGH (ref 70–99)
Glucose-Capillary: 209 mg/dL — ABNORMAL HIGH (ref 70–99)

## 2020-08-28 LAB — URIC ACID: Uric Acid, Serum: 10.7 mg/dL — ABNORMAL HIGH (ref 3.7–8.6)

## 2020-08-28 LAB — IRON AND TIBC
Iron: 22 ug/dL — ABNORMAL LOW (ref 45–182)
Saturation Ratios: 11 % — ABNORMAL LOW (ref 17.9–39.5)
TIBC: 200 ug/dL — ABNORMAL LOW (ref 250–450)
UIBC: 178 ug/dL

## 2020-08-28 LAB — PROCALCITONIN: Procalcitonin: 2.88 ng/mL

## 2020-08-28 LAB — FERRITIN: Ferritin: 99 ng/mL (ref 24–336)

## 2020-08-28 LAB — MAGNESIUM: Magnesium: 1.6 mg/dL — ABNORMAL LOW (ref 1.7–2.4)

## 2020-08-28 LAB — LACTATE DEHYDROGENASE: LDH: 170 U/L (ref 98–192)

## 2020-08-28 LAB — RETICULOCYTES
Immature Retic Fract: 13.8 % (ref 2.3–15.9)
RBC.: 3.01 MIL/uL — ABNORMAL LOW (ref 4.22–5.81)
Retic Count, Absolute: 44.2 10*3/uL (ref 19.0–186.0)
Retic Ct Pct: 1.5 % (ref 0.4–3.1)

## 2020-08-28 LAB — PHOSPHORUS: Phosphorus: 2.3 mg/dL — ABNORMAL LOW (ref 2.5–4.6)

## 2020-08-28 LAB — FOLATE: Folate: 8 ng/mL (ref >5.9)

## 2020-08-28 MED ORDER — MAGNESIUM OXIDE 400 (241.3 MG) MG PO TABS
400.0000 mg | ORAL_TABLET | Freq: Two times a day (BID) | ORAL | Status: AC
  Administered 2020-08-28 (×2): 400 mg via ORAL
  Filled 2020-08-28 (×2): qty 1

## 2020-08-28 MED ORDER — DEXTROSE-NACL 5-0.9 % IV SOLN: INTRAVENOUS | Status: DC

## 2020-08-28 MED ORDER — CARVEDILOL 12.5 MG PO TABS
25.0000 mg | ORAL_TABLET | Freq: Two times a day (BID) | ORAL | Status: DC
  Administered 2020-08-29 – 2020-09-01 (×5): 25 mg via ORAL
  Filled 2020-08-28 (×7): qty 2

## 2020-08-28 MED ORDER — HYDRALAZINE HCL 50 MG PO TABS
50.0000 mg | ORAL_TABLET | Freq: Three times a day (TID) | ORAL | Status: DC
  Administered 2020-08-28 – 2020-09-01 (×7): 50 mg via ORAL
  Filled 2020-08-28 (×9): qty 1

## 2020-08-28 MED ORDER — PROSOURCE PLUS PO LIQD
30.0000 mL | Freq: Two times a day (BID) | ORAL | Status: DC
  Administered 2020-08-28 – 2020-09-11 (×18): 30 mL via ORAL
  Filled 2020-08-28 (×19): qty 30

## 2020-08-28 MED ORDER — JUVEN PO PACK
1.0000 | PACK | Freq: Two times a day (BID) | ORAL | Status: DC
  Administered 2020-08-28 – 2020-09-08 (×14): 1 via ORAL
  Filled 2020-08-28 (×14): qty 1

## 2020-08-28 MED ORDER — ADULT MULTIVITAMIN W/MINERALS CH
1.0000 | ORAL_TABLET | Freq: Every day | ORAL | Status: DC
  Administered 2020-08-28 – 2020-09-08 (×10): 1 via ORAL
  Filled 2020-08-28 (×11): qty 1

## 2020-08-28 NOTE — Progress Notes (Signed)
Initial Nutrition Assessment  DOCUMENTATION CODES:   Non-severe (moderate) malnutrition in context of chronic illness  INTERVENTION:  - will order 30 ml Prosource Plus BID, each supplement provides 100 kcal and 15 grams protein.  - will order 1 tablet multivitamin with minerals/day. 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  GOAL:   Patient will meet greater than or equal to 90% of their needs  MONITOR:   PO intake,Supplement acceptance,Labs,Weight trends  REASON FOR ASSESSMENT:   Malnutrition Screening Tool  ASSESSMENT:   74 y.o. male with medical history of renal transplant, stage 3 CKD, CHF, HTN, type 2 DM--controlled, DM neuropathy, and chronic L foot wound. He presented to the ED with confusion, L foot pain, and nausea.  No intakes documented since admission. Lunch tray was in front of patient at the time of visit. He ate eaten 75% of carrots, 867% of sliced pears, and 0% of pot roast. He reported being out of the room to MRI at the time of breakfast.   Patient seemed to have intermittent confusion/forgetfulness and would sometimes answer questions asked in a way that did not make sense. Unable to obtain any reliable nutrition-related information at the time of visit.   Weight today is 179 lb. And weight on 06/06/20 was 195 lb. This indicates 16 lb weight loss (8.2% body weight) in the past 3 months; significant for time frame. Patient has mild edema to BLE at this time. Patient does report  that BLE edema is a common occurrence for him.    Labs reviewed; CBGs: 145 and 168 mg/dl, Cl: 112 mmol/l, BUN: 42 mg/dl, creatinine: 2.37 mg/dl, Phos: 2.3 mg/dl, Mg: 1.6 mg/dl, GFR: 28 ml/min.  Medications reviewed; sliding scale novolog, 3 units novolog TID, 5 units lantus/day, 5 mg tradjenta/day, 400 mg mag-ox BID, 5 mg deltasone/day.  IVF; D5-NS @ 100 ml/hr (408 kcal).     NUTRITION - FOCUSED PHYSICAL EXAM:  Flowsheet Row Most Recent Value  Orbital Region No depletion  Upper Arm Region Mild depletion  Thoracic and Lumbar Region No depletion  Buccal Region Mild depletion  Temple Region No depletion  Clavicle Bone Region Moderate depletion  Clavicle and Acromion Bone Region Moderate depletion  Scapular Bone Region Unable to assess  Dorsal Hand No depletion  Patellar Region Mild depletion  Anterior Thigh Region Mild depletion  Posterior Calf Region Mild depletion  Edema (RD Assessment) Mild  Hair Reviewed  Eyes Reviewed  Mouth Reviewed  Skin Reviewed  Nails Reviewed       Diet Order:   Diet Order            Diet heart healthy/carb modified Room service appropriate? Yes; Fluid consistency: Thin  Diet effective now                 EDUCATION NEEDS:   Not appropriate for education at this time  Skin:  Skin Assessment: Skin Integrity Issues: Skin Integrity Issues:: Stage I,Diabetic Ulcer Stage I: sacrum Diabetic Ulcer: L foot  Last BM:  PTA/unknown  Height:   Ht Readings  from Last 1 Encounters:  05/26/20 5\' 9"  (1.753 m)    Weight:   Wt Readings from Last 1 Encounters:  08/28/20 81.3 kg    Estimated Nutritional Needs:  Kcal:  1900-2100 kcal Protein:  95-110 grams Fluid:  >/= 2 L/day     Jarome Matin, MS, RD, LDN, CNSC Inpatient Clinical Dietitian RD pager # available in AM[facility]  After hours/weekend pager # available in [facility]

## 2020-08-28 NOTE — TOC Initial Note (Signed)
Transition of Care [facility]) - Initial/Assessment Note    Patient Details  Name: [name] MRN: [id number] Date of Birth: 13-Mar-1946  Transition of Care [facility]) CM/SW Contact:    Leeroy Cha, RN Phone Number: 08/28/2020, 8:36 AM  Clinical Narrative:                 74 y.o. male with medical history significant for history of renal transplant, chronic kidney disease stage IIIb, chronic diastolic CHF, insulin-dependent diabetes mellitus, hypertension, and chronic left foot wound, now presenting to the emergency department with confusion.  Patient had reportedly been complaining of some left foot pain and nausea last night but otherwise seem to be in his usual state when his wife left the house today but she was later called by a home health worker who reported that the patient was confused.  He was able to open the door for the home health agent but unable to answer any of her basic questions.  Patient is unable to contribute to the history.   ED Course: Upon arrival to the ED, patient is found to be febrile to 39.3 C, saturating 98% on 2 L/min of supplemental oxygen, and hypertensive with systolic pressure as high as 237.  EKG features sinus tachycardia with rate 105.  Noncontrast head CT negative for acute intracranial abnormality.  Chest x-ray notable for cardiomegaly with mild diffuse interstitial edema.  Radiographs of the left foot demonstrate soft tissue swelling about the left fifth MTP without acute underlying bony abnormality.  No acute findings on CT of the abdomen and pelvis.  Chemistry panel features a glucose of 232, bicarbonate 17, and creatinine 1.85.  CBC notable for mild leukocytosis and thrombocytopenia, and a improved normocytic anemia.  Lactic acid is 1.9.  BNP 532.  Blood and urine cultures were collected in the emergency department patient was given 500 cc of saline, 40 mg IV Lasix, vancomycin, cefepime, and Flagyl.  He was started on nitroglycerin infusion for blood  pressure.  COVID-19 pcr is negative. PLAN:home with wife Following for progression. Expected Discharge Plan: Home/Self Care Barriers to Discharge: No Barriers Identified   Patient Goals and CMS Choice Patient states their goals for this hospitalization and ongoing recovery are:: to go home CMS Medicare.gov Compare Post Acute Care list provided to:: Patient Choice offered to / list presented to : Patient  Expected Discharge Plan and Services Expected Discharge Plan: Home/Self Care   Discharge Planning Services: CM Consult   Living arrangements for the past 2 months: Single Family Home                                      Prior Living Arrangements/Services Living arrangements for the past 2 months: Single Family Home Lives with:: Spouse Patient language and need for interpreter reviewed:: Yes Do you feel safe going back to the place where you live?: Yes      Need for Family Participation in Patient Care: Yes (Comment) Care giver support system in place?: Yes (comment)   Criminal Activity/Legal Involvement Pertinent to Current Situation/Hospitalization: No - Comment as needed  Activities of Daily Living Home Assistive Devices/Equipment: CBG Meter ADL Screening (condition at time of admission) Patient's cognitive ability adequate to safely complete daily activities?: No Is the patient deaf or have difficulty hearing?: No Does the patient have difficulty seeing, even when wearing glasses/contacts?: No Does the patient have difficulty concentrating, remembering, or making decisions?:  Yes Patient able to express need for assistance with ADLs?: No Does the patient have difficulty dressing or bathing?: Yes Independently performs ADLs?: No Communication: Needs assistance Is this a change from baseline?: Change from baseline, expected to last >3 days Dressing (OT): Needs assistance Grooming: Needs assistance Feeding: Needs assistance Bathing: Needs assistance Toileting:  Needs assistance In/Out Bed: Needs assistance Walks in Home: Needs assistance Does the patient have difficulty walking or climbing stairs?: Yes Weakness of Legs: Both Weakness of Arms/Hands: Both  Permission Sought/Granted                  Emotional Assessment Appearance:: Appears stated age Attitude/Demeanor/Rapport: Engaged Affect (typically observed): Calm Orientation: : Oriented to Self,Oriented to Place,Oriented to  Time,Oriented to Situation Alcohol / Substance Use: Not Applicable Psych Involvement: No (comment)  Admission diagnosis:  Acute pulmonary edema ([place]) [J81.0] Hypertensive emergency [I16.1] Fever in adult [R50.9] Sepsis due to cellulitis ([place]) [L03.90, A41.9] Patient Active Problem List   Diagnosis Date Noted  . Hypertensive emergency 08/27/2020  . Anemia, unspecified 08/27/2020  . Chronic renal failure, stage 3b ([place]) 08/27/2020  . Renal transplant, status post 08/27/2020  . Diabetes mellitus type 2, controlled, with complications ([place]) 65/78/4696  . Diabetic nephropathy ([place]) 08/27/2020  . Pressure injury of skin 08/27/2020  . Sepsis due to cellulitis ([place]) 08/26/2020  . Chronic kidney disease, stage 3b ([place]) 08/26/2020  . Hypertensive urgency 08/26/2020  . Encounter for central line placement   . Altered mental status 05/26/2020  . Sepsis ([place]) 05/26/2020  . Hypernatremia 05/26/2020  . Increased anion gap metabolic acidosis 29/52/8413  . Acute encephalopathy 09/06/2018  . Anemia due to chronic kidney disease 09/06/2018  . Diabetes mellitus type 2, insulin dependent ([place]) 09/06/2018  . Community acquired pneumonia 09/06/2018  . Chronic diastolic heart failure ([place]) 03/25/2017  . Essential hypertension 03/25/2017  . Chronic anticoagulation 02/04/2017  . Dyspnea 02/03/2017  . Varicose veins of lower extremities with ulcer ([place]) 10/15/2015  . Kidney transplant status, cadaveric 10/15/2015  . Immunosuppressed status ([place]) 10/15/2015  . Paronychia  of third finger of right hand 07/16/2011  . ESRD (end stage renal disease) ([place]) 07/16/2011   PCP:  [name], MD Pharmacy:   [id number] - [place], [place] [address] [place] [address] Phone: [phone] Fax: [phone]  [facility] [place], [place] [place] [address] Phone: [phone] Fax: [phone]     Social Determinants of Health (SDOH) Interventions    Readmission Risk Interventions No flowsheet data found.

## 2020-08-28 NOTE — Progress Notes (Signed)
Pt terminated exam. Pt very confused and in pain. Pt crying and wanting out of scanner. Unable to run Sag IR.  Mri FOOT WITHOUT was completed with images that were obtained.

## 2020-08-28 NOTE — Progress Notes (Signed)
PROGRESS NOTE    [name]  HEN:[id number] DOB: 16-Sep-1946 DOA: 08/26/2020 PCP: [name], MD     Brief Narrative:   [name] is a 74 y.o. WM PMHx Renal transplant, CKD stage IIIb , Chronic Diastolic CHF, essential HTN, DM type II controlled, with complication, DM nephropathy,  chronic left foot wound,   Presenting to the emergency department with confusion.  Patient had reportedly been complaining of some left foot pain and nausea last night but otherwise seem to be in his usual state when his wife left the house today but she was later called by a home health worker who reported that the patient was confused.  He was able to open the door for the home health agent but unable to answer any of her basic questions.  Patient is unable to contribute to the history.   ED Course: Upon arrival to the ED, patient is found to be febrile to 39.3 C, saturating 98% on 2 L/min of supplemental oxygen, and hypertensive with systolic pressure as high as 237.  EKG features sinus tachycardia with rate 105.  Noncontrast head CT negative for acute intracranial abnormality.  Chest x-ray notable for cardiomegaly with mild diffuse interstitial edema.  Radiographs of the left foot demonstrate soft tissue swelling about the left fifth MTP without acute underlying bony abnormality.  No acute findings on CT of the abdomen and pelvis.  Chemistry panel features a glucose of 232, bicarbonate 17, and creatinine 1.85.  CBC notable for mild leukocytosis and thrombocytopenia, and a improved normocytic anemia.  Lactic acid is 1.9.  BNP 532.  Blood and urine cultures were collected in the emergency department patient was given 500 cc of saline, 40 mg IV Lasix, vancomycin, cefepime, and Flagyl.  He was started on nitroglycerin infusion for blood pressure.  COVID-19 pcr is negative.   Subjective: 12/9 afebrile overnight A/O x4, negative CP.  Negative nausea.  Negative vomiting.  Patient sitting in bed eating  comfortably.  Off of NTG drip   Assessment & Plan: Covid vaccination; vaccinated   Principal Problem:   Sepsis due to cellulitis [facility]) Active Problems:   Kidney transplant status, cadaveric   Chronic diastolic heart failure ([place])   Essential hypertension   Acute encephalopathy   Anemia due to chronic kidney disease   Diabetes mellitus type 2, insulin dependent (HCC)   Chronic kidney disease, stage 3b ([place])   Hypertensive emergency   Anemia, unspecified   Chronic renal failure, stage 3b ([place])   Renal transplant, status post   Diabetes mellitus type 2, controlled, with complications ([place])   Diabetic nephropathy ([place])   Pressure injury of skin  Sepsis/osteomyelitis LEFT fifth metatarsal -Upon admission patient met criteria for sepsis temp > 38 C, RR> 20, point of infection cellulitis bilateral lower extremity and possible osteomyelitis of LEFT fifth metatarsal  -Continue current antibiotic treatment -Blood cultures pending -MRI LEFT foot pending -Lactic acid/procalcitonin pending we will used to determine narrowing of antibiotic therapy. -12/9 D5-0.9% saline 154m/hr  Acute encephalopathy  -Head CT negative and no focal neuro deficits identified  -Not convinced patient encephalopathic.  Patient appears to be hard of hearing as well as a little bit slow however if you speak loudly and clearly and give him a few minutes to think he answers appropriately. -Tacrolimus level pending -RPR pending -Ammonia/TSH/B12 pending  Renal transplant/CKD stage IIIb (baseline Cr ~1.85) -Renally dose all medications -Hold all renal toxic medication -CellCept 500 mg BID -Prednisone 5 mg daily -Tacrolimus 1 mg BID  Lab Results  Component Value Date   CREATININE 2.37 (H) 08/28/2020   CREATININE 1.92 (H) 08/27/2020   CREATININE 2.09 (H) 08/27/2020   CREATININE 1.85 (H) 08/26/2020   CREATININE 1.66 (H) 06/06/2020    Anemia (baseline HgB 10.9) -Most likely secondary to CKD, anemia of  chronic disease? -Anemia panel pending -Haptoglobin/LDH pending -Fecal occult blood pending  Hypertensive emergency -BP> 180 with chest pain, possible ischemia -Continue nitroglycerin drip -12/9 increase Coreg 25 mg BID  -12/9 Lasix 80 mg daily (hold) renal functioning deteriorating -12/9 increase Hydralazine 50 Mg TID -Strict ins and outs +2.5 L -Daily weight Filed Weights   08/28/20 0500  Weight: 81.3 kg    Chronic Diastolic CHF -Patient asked uncontrolled HTN DC Solu-Cortef -September 2021 EF 55 to 60% -See HTN emergency  Chest pain -See hypertensive emergency  DM type II controlled with complication/DM nephropathy -12/8 hemoglobin A1c= 6.6 -Lantus 5 units daily -NovoLog 3 units qac -Sensitive SSI -Tradjenta 5 mg daily  Gout -12/9 Uric acid = 10.7  -When patient's renal function begins to improve we will start colchicine -Other than his LEFT FIFTH METATARSAL NO JOINT PAIN. -Patient on prednisone for his renal transplant if truly a gout flare, may increase.   DVT prophylaxis: Heparin subcu Code Status: Full Family Communication:  Status is: Inpatient    Dispo: The patient is from: Home              Anticipated d/c is to: Home              Anticipated d/c date is: 12/12              Patient currently unstable      Consultants:    Procedures/Significant Events:    I have personally reviewed and interpreted all radiology studies and my findings are as above.  VENTILATOR SETTINGS:    Cultures 12/7 blood LEFT hand NGTD  12/7 RIGHT arm NGTD  12/7 urine insignificant growth   Antimicrobials: Anti-infectives (From admission, onward)   Start     Ordered Stop   08/26/20 2359  cefTRIAXone (ROCEPHIN) 1 g in sodium chloride 0.9 % 100 mL IVPB        08/26/20 2028     08/26/20 1545  ceFEPIme (MAXIPIME) 2 g in sodium chloride 0.9 % 100 mL IVPB        08/26/20 1532 08/26/20 1631   08/26/20 1545  metroNIDAZOLE (FLAGYL) tablet 500 mg  Status:   Discontinued        08/26/20 1532 08/26/20 2015   08/26/20 1545  vancomycin (VANCOCIN) IVPB 1000 mg/200 mL premix        08/26/20 1532 08/26/20 1823       Devices    LINES / TUBES:      Continuous Infusions: . sodium chloride    . cefTRIAXone (ROCEPHIN)  IV Stopped (08/28/20 0130)  . nitroGLYCERIN 35 mcg/min (08/28/20 0621)     Objective: Vitals:   08/28/20 0455 08/28/20 0500 08/28/20 0600 08/28/20 0800  BP:  135/81 (!) 148/77   Pulse:  72 80   Resp:  14 17   Temp: 98.4 F (36.9 C)   97.7 F (36.5 C)  TempSrc: Oral   Oral  SpO2:  100% 97%   Weight:  81.3 kg      Intake/Output Summary (Last 24 hours) at 08/28/2020 0911 Last data filed at 08/28/2020 0700 Gross per 24 hour  Intake 939.84 ml  Output 1250 ml  Net -310.16 ml  Filed Weights   08/28/20 0500  Weight: 81.3 kg    Examination:  General: A/O x4, No acute respiratory distress Eyes: negative scleral hemorrhage, negative anisocoria, negative icterus ENT: Negative Runny nose, negative gingival bleeding, Neck:  Negative scars, masses, torticollis, lymphadenopathy, JVD Lungs: Clear to auscultation bilaterally without wheezes or crackles Cardiovascular: Regular rate and rhythm without murmur gallop or rub normal S1 and S2 Abdomen: negative abdominal pain, nondistended, positive soft, bowel sounds, no rebound, no ascites, no appreciable mass Extremities: patient with multiple ulcers on bilateral lower extremities, RIGHT>>> LEFT.  Left fifth metatarsal appears to have lesion to the bone, warm to touch painful to palpation Skin: See extremities Psychiatric:  Negative depression, negative anxiety, negative fatigue, negative mania  Central nervous system:  Cranial nerves II through XII intact, tongue/uvula midline, all extremities muscle strength 5/5, sensation intact throughout, negative dysarthria, negative expressive aphasia, negative receptive aphasia.  .     Data Reviewed: Care during the described time  interval was provided by me .  I have reviewed this patient's available data, including medical history, events of note, physical examination, and all test results as part of my evaluation.  CBC: Recent Labs  Lab 08/26/20 1530 08/27/20 1227 08/28/20 0244  WBC 10.7* 8.0 7.5  NEUTROABS 8.5*  --  6.0  HGB 10.9* 9.8* 8.4*  HCT 37.6* 33.3* 28.5*  MCV 96.4 96.0 96.6  PLT 120* 102* 91*   Basic Metabolic Panel: Recent Labs  Lab 08/26/20 1530 08/27/20 1227 08/28/20 0244  NA 145 145  146* 142  K 4.7 4.1  4.1 3.8  CL 116* 116*  115* 112*  CO2 17* 16*  18* 19*  GLUCOSE 232* 241*  243* 147*  BUN 32* 37*  37* 42*  CREATININE 1.85* 2.09*  1.92* 2.37*  CALCIUM 9.8 9.6  9.5 9.1  MG  --  1.7 1.6*  PHOS  --  2.6 2.3*   GFR: Estimated Creatinine Clearance: 27.8 mL/min (A) (by C-G formula based on SCr of 2.37 mg/dL (H)). Liver Function Tests: Recent Labs  Lab 08/26/20 1530 08/27/20 1227 08/28/20 0244  AST _0 ALT _1 ALKPHOS 95 83 61  BILITOT 1.4* 1.1 1.0  PROT 6.5 5.9* 5.0*  ALBUMIN 4.0 3.4* 2.9*   No results for input(s): LIPASE, AMYLASE in the last 168 hours. Recent Labs  Lab 08/26/20 2203  AMMONIA 31   Coagulation Profile: Recent Labs  Lab 08/26/20 1530 08/27/20 1654  INR 1.3* 1.3*   Cardiac Enzymes: No results for input(s): CKTOTAL, CKMB, CKMBINDEX, TROPONINI in the last 168 hours. BNP (last 3 results) No results for input(s): PROBNP in the last 8760 hours. HbA1C: Recent Labs    08/27/20 1227  HGBA1C 6.6*   CBG: Recent Labs  Lab 08/27/20 0750 08/27/20 1129 08/27/20 1620 08/27/20 2230 08/28/20 0806  GLUCAP 250* 235* 167* 139* 145*   Lipid Profile: No results for input(s): CHOL, HDL, LDLCALC, TRIG, CHOLHDL, LDLDIRECT in the last 72 hours. Thyroid Function Tests: Recent Labs    08/26/20 2203  TSH 2.205   Anemia Panel: Recent Labs    08/26/20 2203 08/26/20 2205 08/28/20 0244  VITAMINB12 347  --  516  FOLATE  --  11.8 8.0   FERRITIN  --   --  99  TIBC  --   --  200*  IRON  --   --  22*  RETICCTPCT  --   --  1.5   Sepsis Labs: Recent Labs  Lab  08/26/20 1530 08/27/20 1616 08/27/20 1648 08/27/20 1940 08/28/20 0244  PROCALCITON  --  2.60  --   --  2.88  LATICACIDVEN 1.9  --  1.7 2.1*  --     Recent Results (from the past 240 hour(s))  Blood Culture (routine x 2)     Status: None (Preliminary result)   Collection Time: 08/26/20  3:30 PM   Specimen: BLOOD LEFT HAND  Result Value Ref Range Status   Specimen Description   Final    BLOOD LEFT HAND Performed at [facility] [address] [place], [place] [address]    Special Requests   Final    BOTTLES DRAWN AEROBIC AND ANAEROBIC Blood Culture adequate volume Performed at [facility] [address] [place], [place] [address]    Culture   Final    NO GROWTH 2 DAYS Performed at [facility] [address] [place], [place] [address]    Report Status PENDING  Incomplete  Blood Culture (routine x 2)     Status: None (Preliminary result)   Collection Time: 08/26/20  3:30 PM   Specimen: BLOOD  Result Value Ref Range Status   Specimen Description   Final    BLOOD RIGHT ARM Performed at [facility] [address] [place], [place] [address]    Special Requests   Final    BOTTLES DRAWN AEROBIC AND ANAEROBIC Blood Culture results may not be optimal due to an inadequate volume of blood received in culture bottles Performed at [facility] [address], [place], [place] [address]    Culture   Final    NO GROWTH 2 DAYS Performed at [facility] [address] [place], [place] [address]    Report Status PENDING  Incomplete  Urine culture     Status: Abnormal   Collection Time: 08/26/20  3:33 PM   Specimen: In/Out Cath Urine  Result Value Ref Range Status   Specimen Description   Final    IN/OUT CATH URINE Performed at [facility] [address] [place], [place] [address]    Special Requests   Final    NONE Performed at [facility], [place] [address], [place], [place] [address]    Culture (A)  Final    <10,000 COLONIES/mL INSIGNIFICANT GROWTH Performed at [facility] [address] [place],  [address]    Report Status 08/28/2020 FINAL  Final  Resp Panel by RT-PCR (Flu A&B, Covid) In/Out Cath Urine     Status: None   Collection Time: 08/26/20  4:30 PM   Specimen: In/Out Cath Urine; Nasopharyngeal(NP) swabs in vial transport medium  Result Value Ref Range Status   SARS Coronavirus 2 by RT PCR NEGATIVE NEGATIVE Final    Comment: (NOTE) SARS-CoV-2 target nucleic acids are NOT DETECTED.  The SARS-CoV-2 RNA is generally detectable in upper respiratory specimens during the acute phase of infection. The lowest concentration of SARS-CoV-2 viral copies this assay can detect is 138 copies/mL. A negative result does not preclude SARS-Cov-2 infection and should not be used as the sole basis for treatment or other patient management decisions. A negative result may occur with  improper specimen collection/handling, submission of specimen other than nasopharyngeal swab, presence of viral mutation(s) within the areas targeted by this assay, and inadequate number of viral copies(<138 copies/mL). A negative result must be combined with clinical observations, patient history, and epidemiological information. The  expected result is Negative.  Fact Sheet for Patients:  EntrepreneurPulse.com.au  Fact Sheet for Healthcare Providers:  IncredibleEmployment.be  This test is no t yet approved or cleared by the [place] FDA and  has been authorized for detection and/or diagnosis of SARS-CoV-2 by FDA under an Emergency Use Authorization (EUA). This EUA will remain  in effect (meaning this test can be used) for the duration of the COVID-19 declaration under  Section 564(b)(1) of the Act, 21 U.S.C.section 360bbb-3(b)(1), unless the authorization is terminated  or revoked sooner.       Influenza A by PCR NEGATIVE NEGATIVE Final   Influenza B by PCR NEGATIVE NEGATIVE Final    Comment: (NOTE) The Xpert Xpress SARS-CoV-2/FLU/RSV plus assay is intended as an aid in the diagnosis of influenza from Nasopharyngeal swab specimens and should not be used as a sole basis for treatment. Nasal washings and aspirates are unacceptable for Xpert Xpress SARS-CoV-2/FLU/RSV testing.  Fact Sheet for Patients: EntrepreneurPulse.com.au  Fact Sheet for Healthcare Providers: IncredibleEmployment.be  This test is not yet approved or cleared by the [place] FDA and has been authorized for detection and/or diagnosis of SARS-CoV-2 by FDA under an Emergency Use Authorization (EUA). This EUA will remain in effect (meaning this test can be used) for the duration of the COVID-19 declaration under Section 564(b)(1) of the Act, 21 U.S.C. section 360bbb-3(b)(1), unless the authorization is terminated or revoked.  Performed at [facility], [place] [address], [place], [place] [address]   MRSA PCR Screening     Status: None   Collection Time: 08/27/20 12:22 PM   Specimen: Nasal Mucosa; Nasopharyngeal  Result Value Ref Range Status   MRSA by PCR NEGATIVE NEGATIVE Final    Comment:        The GeneXpert MRSA Assay (FDA approved for NASAL specimens only), is one component of a comprehensive MRSA colonization surveillance program. It is not intended to diagnose MRSA infection nor to guide or monitor treatment for MRSA infections. Performed at [facility], [place] [address] [place], [place] [address]          Radiology Studies: CT ABDOMEN PELVIS WO CONTRAST  Result Date: 08/26/2020 CLINICAL DATA:  Acute abdominal and back pain, sepsis; history chronic kidney disease, prior renal  transplant, CHF, diabetes mellitus, hypertension, atrial fibrillation EXAM: CT ABDOMEN AND PELVIS WITHOUT CONTRAST TECHNIQUE: Multidetector CT imaging of the abdomen and pelvis was performed following the standard protocol without IV contrast. Sagittal and coronal MPR images reconstructed from axial data set. Neither oral nor intravenous contrast were administered. Patient respiratory motion artifacts degrade exam. COMPARISON:  05/26/2020 FINDINGS: Lower chest: Small bibasilar pleural effusions and compressive atelectasis in the RIGHT lower lobe. Pericardial calcification. Enlargement of cardiac chambers. Hepatobiliary: Gallbladder and liver normal appearance Pancreas: Normal appearance Spleen: Normal appearance Adrenals/Urinary Tract: Atrophic native kidneys. Small RIGHT renal cysts. Old calcified renal transplant in RIGHT iliac fossa. Transplant kidney LEFT iliac fossa without mass or hydronephrosis. Decompressed bladder. Stomach/Bowel: Scattered colonic diverticulosis. No definite CT evidence of acute diverticulitis. Stomach and bowel loops otherwise unremarkable. Vascular/Lymphatic: Extensive atherosclerotic calcifications aorta, iliac arteries, and femoral arteries. Additional coronary arterial and extensive small vessel vascular calcifications throughout abdomen and pelvis. Aorta normal caliber. No adenopathy. Reproductive: Unremarkable prostate gland and seminal vesicles Other: Scattered stranding of tissue planes in abdominal wall, mesentery, and pelvis. No free air or free fluid. No hernia. Musculoskeletal: Diffuse osseous demineralization. IMPRESSION: Small bibasilar pleural effusions and compressive atelectasis in the RIGHT lower lobe. Pericardial  calcification. Atrophic native kidneys with old calcified renal transplant in RIGHT iliac fossa. Transplant kidney LEFT iliac fossa without mass or hydronephrosis. Scattered colonic diverticulosis without evidence of diverticulitis. No definite acute  intra-abdominal or intrapelvic abnormalities. Aortic Atherosclerosis (ICD10-I70.0). Electronically Signed   By: Lavonia Dana M.D.   On: 08/26/2020 19:41   CT Head Wo Contrast  Result Date: 08/26/2020 CLINICAL DATA:  74 year old male with altered mental status. Atrial fibrillation. Diabetic foot wound. Fever. EXAM: CT HEAD WITHOUT CONTRAST TECHNIQUE: Contiguous axial images were obtained from the base of the skull through the vertex without intravenous contrast. COMPARISON:  Brain MRI 05/28/2020.  Head CT 05/26/2020 and earlier. FINDINGS: Brain: Cerebral volume is stable and within normal limits. No midline shift, ventriculomegaly, mass effect, evidence of mass lesion, intracranial hemorrhage or evidence of cortically based acute infarction. Stable gray-white matter differentiation, within normal limits for age. Vascular: Extensive Calcified atherosclerosis at the skull base. No suspicious intracranial vascular hyperdensity. Dolichoectatic distal right vertebral artery. Skull: No acute osseous abnormality identified. Sinuses/Orbits: Tympanic cavities and mastoids remain clear. Stable mild paranasal sinus mucosal thickening. Other: Calcified scalp vessel atherosclerosis. No acute orbit or scalp soft tissue finding. IMPRESSION: 1. No acute intracranial abnormality. Stable and negative for age non contrast CT appearance of the brain. 2. Very advanced calcified atherosclerosis. Electronically Signed   By: Genevie Ann M.D.   On: 08/26/2020 18:37   DG Chest Port 1 View  Result Date: 08/26/2020 CLINICAL DATA:  Soft tissue ulceration of the foot EXAM: PORTABLE CHEST 1 VIEW COMPARISON:  05/29/2020 FINDINGS: Cardiomegaly. Mild, diffuse interstitial pulmonary opacity. Osseous structures are unremarkable. IMPRESSION: Cardiomegaly and mild, diffuse interstitial pulmonary opacity, likely edema. No focal airspace opacity. Electronically Signed   By: Eddie Candle M.D.   On: 08/26/2020 16:51   DG Foot Complete Left  Result  Date: 08/26/2020 CLINICAL DATA:  Soft tissue ulcer of the fifth digit. EXAM: LEFT FOOT - COMPLETE 3+ VIEW COMPARISON:  None. FINDINGS: Extensive microvascular calcifications are present, compatible with diabetes. Soft swelling is present about the fifth MTP joint. No rows of changes are present. No gas is evident within the soft tissues. No other acute or focal osseous abnormalities are present. IMPRESSION: 1. Soft tissue swelling about the fifth MTP joint without underlying osseous abnormality. 2. No radiopaque foreign body or gas in the soft tissues. 3. Extensive microvascular calcifications compatible with diabetes. Electronically Signed   By: San Morelle M.D.   On: 08/26/2020 16:52        Scheduled Meds: . allopurinol  100 mg Oral Daily  . aspirin EC  162 mg Oral BID  . atorvastatin  10 mg Oral Daily  . calcitRIOL  0.25 mcg Oral Daily  . carvedilol  12.5 mg Oral BID WC  . Chlorhexidine Gluconate Cloth  6 each Topical Daily  . furosemide  80 mg Oral Daily  . heparin  5,000 Units Subcutaneous Q8H  . hydrALAZINE  25 mg Oral TID  . insulin aspart  0-5 Units Subcutaneous QHS  . insulin aspart  0-9 Units Subcutaneous TID WC  . insulin aspart  3 Units Subcutaneous TID WC  . insulin glargine  5 Units Subcutaneous Daily  . latanoprost  1 drop Both Eyes QHS  . linagliptin  5 mg Oral Daily  . magnesium oxide  400 mg Oral BID  . mouth rinse  15 mL Mouth Rinse BID  . mycophenolate  500 mg Oral BID  . [START ON 08/30/2020] predniSONE  5 mg Oral Daily  .  sodium chloride flush  3 mL Intravenous Q12H  . sodium chloride flush  3 mL Intravenous Q12H  . tacrolimus  1 mg Oral BID   Continuous Infusions: . sodium chloride    . cefTRIAXone (ROCEPHIN)  IV Stopped (08/28/20 0130)  . nitroGLYCERIN 35 mcg/min (08/28/20 0621)     LOS: 2 days    Time spent:40 min    Alessio Bogan, Geraldo Docker, MD [facility] Pager [phone]  If 7PM-7AM, please contact  night-coverage [email or web address] Password [email or web address] 08/28/2020, 9:11 AM

## 2020-08-29 ENCOUNTER — Inpatient Hospital Stay (HOSPITAL_COMMUNITY): Payer: Medicare Other

## 2020-08-29 DIAGNOSIS — I5023 Acute on chronic systolic (congestive) heart failure: Secondary | ICD-10-CM

## 2020-08-29 DIAGNOSIS — E440 Moderate protein-calorie malnutrition: Secondary | ICD-10-CM | POA: Insufficient documentation

## 2020-08-29 LAB — CBC WITH DIFFERENTIAL/PLATELET
Abs Immature Granulocytes: 0.03 10*3/uL (ref 0.00–0.07)
Basophils Absolute: 0 10*3/uL (ref 0.0–0.1)
Basophils Relative: 0 %
Eosinophils Absolute: 0.2 10*3/uL (ref 0.0–0.5)
Eosinophils Relative: 3 %
HCT: 33.1 % — ABNORMAL LOW (ref 39.0–52.0)
Hemoglobin: 9.8 g/dL — ABNORMAL LOW (ref 13.0–17.0)
Immature Granulocytes: 0 %
Lymphocytes Relative: 16 %
Lymphs Abs: 1.1 10*3/uL (ref 0.7–4.0)
MCH: 28.7 pg (ref 26.0–34.0)
MCHC: 29.6 g/dL — ABNORMAL LOW (ref 30.0–36.0)
MCV: 96.8 fL (ref 80.0–100.0)
Monocytes Absolute: 0.7 10*3/uL (ref 0.1–1.0)
Monocytes Relative: 10 %
Neutro Abs: 4.8 10*3/uL (ref 1.7–7.7)
Neutrophils Relative %: 71 %
Platelets: 116 10*3/uL — ABNORMAL LOW (ref 150–400)
RBC: 3.42 MIL/uL — ABNORMAL LOW (ref 4.22–5.81)
RDW: 14.7 % (ref 11.5–15.5)
WBC: 6.8 10*3/uL (ref 4.0–10.5)
nRBC: 0 % (ref 0.0–0.2)

## 2020-08-29 LAB — GLUCOSE, CAPILLARY
Glucose-Capillary: 229 mg/dL — ABNORMAL HIGH (ref 70–99)
Glucose-Capillary: 130 mg/dL — ABNORMAL HIGH (ref 70–99)
Glucose-Capillary: 153 mg/dL — ABNORMAL HIGH (ref 70–99)
Glucose-Capillary: 237 mg/dL — ABNORMAL HIGH (ref 70–99)

## 2020-08-29 LAB — ECHOCARDIOGRAM COMPLETE
Area-P 1/2: 5.27 cm2
P 1/2 time: 505 msec
S' Lateral: 3.5 cm
Weight: 3040.58 oz

## 2020-08-29 LAB — COMPREHENSIVE METABOLIC PANEL
ALT: 10 U/L (ref 0–44)
AST: 24 U/L (ref 15–41)
Albumin: 3.2 g/dL — ABNORMAL LOW (ref 3.5–5.0)
Alkaline Phosphatase: 80 U/L (ref 38–126)
Anion gap: 12 (ref 5–15)
BUN: 49 mg/dL — ABNORMAL HIGH (ref 8–23)
CO2: 18 mmol/L — ABNORMAL LOW (ref 22–32)
Calcium: 9.1 mg/dL (ref 8.9–10.3)
Chloride: 112 mmol/L — ABNORMAL HIGH (ref 98–111)
Creatinine, Ser: 2.27 mg/dL — ABNORMAL HIGH (ref 0.61–1.24)
GFR, Estimated: 30 mL/min — ABNORMAL LOW (ref >60)
Glucose, Bld: 205 mg/dL — ABNORMAL HIGH (ref 70–99)
Potassium: 4 mmol/L (ref 3.5–5.1)
Sodium: 142 mmol/L (ref 135–145)
Total Bilirubin: 0.6 mg/dL (ref 0.3–1.2)
Total Protein: 5.6 g/dL — ABNORMAL LOW (ref 6.5–8.1)

## 2020-08-29 LAB — HAPTOGLOBIN: Haptoglobin: 78 mg/dL (ref 34–355)

## 2020-08-29 LAB — MAGNESIUM: Magnesium: 1.7 mg/dL (ref 1.7–2.4)

## 2020-08-29 LAB — PROCALCITONIN: Procalcitonin: 2.17 ng/mL

## 2020-08-29 LAB — PHOSPHORUS: Phosphorus: 2.1 mg/dL — ABNORMAL LOW (ref 2.5–4.6)

## 2020-08-29 MED ORDER — PREDNISONE 50 MG PO TABS
50.0000 mg | ORAL_TABLET | Freq: Every day | ORAL | Status: DC
  Administered 2020-08-30 – 2020-09-07 (×8): 50 mg via ORAL
  Filled 2020-08-29 (×3): qty 2
  Filled 2020-08-29: qty 1
  Filled 2020-08-29: qty 2
  Filled 2020-08-29: qty 1
  Filled 2020-08-29 (×3): qty 2

## 2020-08-29 MED ORDER — MAGNESIUM SULFATE 2 GM/50ML IV SOLN
2.0000 g | Freq: Once | INTRAVENOUS | Status: AC
  Administered 2020-08-29: 2 g via INTRAVENOUS
  Filled 2020-08-29: qty 50

## 2020-08-29 MED ORDER — ALBUMIN HUMAN 25 % IV SOLN
25.0000 g | Freq: Once | INTRAVENOUS | Status: AC
  Administered 2020-08-29: 25 g via INTRAVENOUS
  Filled 2020-08-29: qty 100

## 2020-08-29 NOTE — Progress Notes (Signed)
  Echocardiogram 2D Echocardiogram has been performed.  [name] 08/29/2020, 3:47 PM

## 2020-08-29 NOTE — Progress Notes (Signed)
PROGRESS NOTE    [name]  GQQ:[id number] DOB: [birth year] DOA: 08/26/2020 PCP: [name], MD     Brief Narrative:   [name] is a 74 y.o. WM PMHx Renal transplant, CKD stage IIIb , Chronic Diastolic CHF, essential HTN, DM type II controlled, with complication, DM nephropathy,  chronic left foot wound,   Presenting to the emergency department with confusion.  Patient had reportedly been complaining of some left foot pain and nausea last night but otherwise seem to be in his usual state when his wife left the house today but she was later called by a home health worker who reported that the patient was confused.  He was able to open the door for the home health agent but unable to answer any of her basic questions.  Patient is unable to contribute to the history.   ED Course: Upon arrival to the ED, patient is found to be febrile to 39.3 C, saturating 98% on 2 L/min of supplemental oxygen, and hypertensive with systolic pressure as high as 237.  EKG features sinus tachycardia with rate 105.  Noncontrast head CT negative for acute intracranial abnormality.  Chest x-ray notable for cardiomegaly with mild diffuse interstitial edema.  Radiographs of the left foot demonstrate soft tissue swelling about the left fifth MTP without acute underlying bony abnormality.  No acute findings on CT of the abdomen and pelvis.  Chemistry panel features a glucose of 232, bicarbonate 17, and creatinine 1.85.  CBC notable for mild leukocytosis and thrombocytopenia, and a improved normocytic anemia.  Lactic acid is 1.9.  BNP 532.  Blood and urine cultures were collected in the emergency department patient was given 500 cc of saline, 40 mg IV Lasix, vancomycin, cefepime, and Flagyl.  He was started on nitroglycerin infusion for blood pressure.  COVID-19 pcr is negative.   Subjective: 12/10 T-max overnight 39.1 C, patient drowsy this a.m.  However most likely secondary to hypotension.  NTG drip  started secondary to patient becoming hypertensive during the night and then patient received dose of his scheduled BP medication.  NTG drip now off  Assessment & Plan: Covid vaccination; vaccinated   Principal Problem:   Sepsis due to cellulitis [facility]) Active Problems:   Kidney transplant status, cadaveric   Chronic diastolic heart failure ([place])   Essential hypertension   Acute encephalopathy   Anemia due to chronic kidney disease   Diabetes mellitus type 2, insulin dependent (HCC)   Chronic kidney disease, stage 3b ([place])   Hypertensive emergency   Anemia, unspecified   Chronic renal failure, stage 3b ([place])   Renal transplant, status post   Diabetes mellitus type 2, controlled, with complications ([place])   Diabetic nephropathy ([name])   Pressure injury of skin  Sepsis/osteomyelitis LEFT fifth metatarsal -Upon admission patient met criteria for sepsis temp > 38 C, RR> 20, point of infection cellulitis bilateral lower extremity and possible osteomyelitis of LEFT fifth metatarsal  -Continue current antibiotic treatment -Blood cultures pending -MRI LEFT foot; poor MRI secondary to motion artifact however infection, and erosions of metatarsals bone see report below. -Lactic acid/procalcitonin pending we will used to determine narrowing of antibiotic therapy. -12/9 D5-0.9% saline 157ml/hr -12/10 ABI bilateral lower extremity pending -12/10 after ABI obtained will contact Dr. Sharol Given and discuss findings, is this because of sepsis and is surgery warranted?  Acute encephalopathy  -Head CT negative and no focal neuro deficits identified  -Not convinced patient encephalopathic.  Patient appears to be hard of hearing as well as  a little bit slow however if you speak loudly and clearly and give him a few minutes to think he answers appropriately. -Tacrolimus level pending -RPR nonreactive -Ammonia WNL -TSH WNL -B12/WNL   Renal transplant/CKD stage IIIb (baseline Cr ~1.85) -Renally dose all  medications -Hold all renal toxic medication -CellCept 500 mg BID -12/10 Increase Prednisone 50 mg daily for possible Gout Flare. (Normal dose 5 mg daily)  -Tacrolimus 1 mg BID Lab Results  Component Value Date   CREATININE 2.27 (H) 08/29/2020   CREATININE 2.37 (H) 08/28/2020   CREATININE 1.92 (H) 08/27/2020   CREATININE 2.09 (H) 08/27/2020   CREATININE 1.85 (H) 08/26/2020  -D5-0.9% saline 179m/hr  Anemia (baseline HgB 10.9) -Most likely secondary to CKD, anemia of chronic disease? -Anemia panel pending -Haptoglobin/LDH pending -Fecal occult blood pending  Hypertensive emergency -BP> 180 with chest pain, possible ischemia -Continue nitroglycerin drip -12/9 increase Coreg 25 mg BID  -12/9 Lasix 80 mg daily (hold) renal functioning deteriorating -12/9 increase Hydralazine 50 Mg TID -Strict ins and outs +5.1 L -Daily weight Filed Weights   08/28/20 0500 08/29/20 0500  Weight: 81.3 kg 86.2 kg   Chronic Diastolic CHF -Patient asked uncontrolled HTN DC Solu-Cortef -September 2021 EF 55 to 60% -See HTN emergency -12/10 echocardiogram pending  Chest pain -See hypertensive emergency  Hypotension --12/10 Albumin 25g  X 1   DM type II controlled with complication/DM nephropathy -12/8 hemoglobin A1c= 6.6 -Lantus 5 units daily -NovoLog 3 units qac -Sensitive SSI -Tradjenta 5 mg daily  Gout -12/9 Uric acid = 10.7  -When patient's renal function begins to improve we will start colchicine -Other than his LEFT FIFTH METATARSAL NO JOINT PAIN. -Patient on prednisone for his renal transplant; may be a gout flare,See Renal Transplant .  Hypomagnesmia -Magnesium goal> -Magnesium IV 2 g   DVT prophylaxis: Heparin subcu Code Status: Full Family Communication:  Status is: Inpatient    Dispo: The patient is from: Home              Anticipated d/c is to: Home              Anticipated d/c date is: 12/12              Patient currently unstable      Consultants:     Procedures/Significant Events:  12/7 PCXR;Cardiomegaly and mild, diffuse interstitial pulmonary opacity, likely edema. No focal airspace opacity 12/7 CT abdomen pelvis W. Wo contrast;Small bibasilar pleural effusions and compressive atelectasis in the RIGHT lower lobe.  Pericardial calcification.  Atrophic native kidneys with old calcified renal transplant in RIGHT iliac fossa.  Transplant kidney LEFT iliac fossa without mass or hydronephrosis.  Scattered colonic diverticulosis without evidence of diverticulitis.  No definite acute intra-abdominal or intrapelvic abnormalities.  Aortic Atherosclerosis (ICD10-I70.0). 12/10 MRI LEFT foot W0 contrast;Prominent erosions and irregularity along the articular surfaces of the first MTP joint and in the distal half of the proximal phalanx of the great toe, and in the base of the distal phalanx fourth toe. The phalanges of the fifth toe are somewhat obscured by motion artifact, but I not see clear evidence of osteomyelitis. 2. Small erosion of the base of the distal phalanx fourth toe. 3. Circumferential edema in the forefoot, especially dorsally, cellulitis is not excluded. No drainable fluid collection is identified. 4. Generalized muscular atrophy in the forefoot. Low-grade edema is likely neurogenic. 5. Despite efforts by the technologist and patient, severe motion artifact is present on today's exam and could not be  eliminated. This reduces exam sensitivity and specificity. The patient terminated the exam prior to the acquisition of sagittal images and would not allow repeat series to correct for the severely motion degraded images.  I have personally reviewed and interpreted all radiology studies and my findings are as above.  VENTILATOR SETTINGS:    Cultures 12/7 blood LEFT hand NGTD  12/7 RIGHT arm NGTD  12/7 urine insignificant growth 12/8 MRSA by PCR negative    Antimicrobials: Anti-infectives (From  admission, onward)   Start     Ordered Stop   08/26/20 2359  cefTRIAXone (ROCEPHIN) 1 g in sodium chloride 0.9 % 100 mL IVPB        08/26/20 2028     08/26/20 1545  ceFEPIme (MAXIPIME) 2 g in sodium chloride 0.9 % 100 mL IVPB        08/26/20 1532 08/26/20 1631   08/26/20 1545  metroNIDAZOLE (FLAGYL) tablet 500 mg  Status:  Discontinued        08/26/20 1532 08/26/20 2015   08/26/20 1545  vancomycin (VANCOCIN) IVPB 1000 mg/200 mL premix        08/26/20 1532 08/26/20 1823       Devices    LINES / TUBES:      Continuous Infusions: . sodium chloride    . cefTRIAXone (ROCEPHIN)  IV Stopped (08/28/20 2337)  . dextrose 5 % and 0.9% NaCl 100 mL/hr at 08/29/20 0836  . nitroGLYCERIN 75 mcg/min (08/29/20 0836)     Objective: Vitals:   08/29/20 0745 08/29/20 0800 08/29/20 0815 08/29/20 0830  BP: (!) 166/73 (!) 155/68 110/60 (!) 106/41  Pulse: 94 94    Resp: (!) 27 (!) 21 (!) 31 (!) 27  Temp:  (!) 102.3 F (39.1 C)    TempSrc:  Oral    SpO2: 99% 100%    Weight:        Intake/Output Summary (Last 24 hours) at 08/29/2020 0836 Last data filed at 08/29/2020 0836 Gross per 24 hour  Intake 2180.21 ml  Output 430 ml  Net 1750.21 ml   Filed Weights   08/28/20 0500 08/29/20 0500  Weight: 81.3 kg 86.2 kg   Physical Exam:  General: Sleepy, but arousable however does not make a lot of sense when you ask him questions.  Does not follow all commands No acute respiratory distress Eyes: negative scleral hemorrhage, negative anisocoria, negative icterus ENT: Negative Runny nose, negative gingival bleeding, Neck:  Negative scars, masses, torticollis, lymphadenopathy, JVD Lungs: Clear to auscultation bilaterally without wheezes or crackles Cardiovascular: Regular rate and rhythm without murmur gallop or rub normal S1 and S2 Abdomen: negative abdominal pain, nondistended, positive soft, bowel sounds, no rebound, no ascites, no appreciable mass Extremities: pain whenever left lower  extremity straightened, or knee joint, ankle joint, toes palpated.  Due to patient's sleepiness unable to pinpoint exactly the worst point of tenderness. patient with multiple ulcers on bilateral lower extremities, RIGHT>>> LEFT.  Left fifth metatarsal appears to have lesion to the bone, warm to touch painful to palpation Skin: See extremities Psychiatric: Unable to evaluate Central nervous system: Response to painful stimuli, follows some commands  .     Data Reviewed: Care during the described time interval was provided by me .  I have reviewed this patient's available data, including medical history, events of note, physical examination, and all test results as part of my evaluation.  CBC: Recent Labs  Lab 08/26/20 1530 08/27/20 1227 08/28/20 0244 08/29/20 0243  WBC 10.7* 8.0 7.5  6.8  NEUTROABS 8.5*  --  6.0 4.8  HGB 10.9* 9.8* 8.4* 9.8*  HCT 37.6* 33.3* 28.5* 33.1*  MCV 96.4 96.0 96.6 96.8  PLT 120* 102* 91* 974*   Basic Metabolic Panel: Recent Labs  Lab 08/26/20 1530 08/27/20 1227 08/28/20 0244 08/29/20 0243  NA 145 145  146* 142 142  K 4.7 4.1  4.1 3.8 4.0  CL 116* 116*  115* 112* 112*  CO2 17* 16*  18* 19* 18*  GLUCOSE 232* 241*  243* 147* 205*  BUN 32* 37*  37* 42* 49*  CREATININE 1.85* 2.09*  1.92* 2.37* 2.27*  CALCIUM 9.8 9.6  9.5 9.1 9.1  MG  --  1.7 1.6* 1.7  PHOS  --  2.6 2.3* 2.1*   GFR: Estimated Creatinine Clearance: 31.5 mL/min (A) (by C-G formula based on SCr of 2.27 mg/dL (H)). Liver Function Tests: Recent Labs  Lab 08/26/20 1530 08/27/20 1227 08/28/20 0244 08/29/20 0243  AST 28 25 23 24   ALT 11 10 9 10   ALKPHOS 95 83 61 80  BILITOT 1.4* 1.1 1.0 0.6  PROT 6.5 5.9* 5.0* 5.6*  ALBUMIN 4.0 3.4* 2.9* 3.2*   No results for input(s): LIPASE, AMYLASE in the last 168 hours. Recent Labs  Lab 08/26/20 2203  AMMONIA 31   Coagulation Profile: Recent Labs  Lab 08/26/20 1530 08/27/20 1654  INR 1.3* 1.3*   Cardiac Enzymes: No  results for input(s): CKTOTAL, CKMB, CKMBINDEX, TROPONINI in the last 168 hours. BNP (last 3 results) No results for input(s): PROBNP in the last 8760 hours. HbA1C: Recent Labs    08/27/20 1227  HGBA1C 6.6*   CBG: Recent Labs  Lab 08/28/20 0806 08/28/20 1221 08/28/20 1638 08/28/20 2149 08/29/20 0742  GLUCAP 145* 168* 132* 209* 229*   Lipid Profile: No results for input(s): CHOL, HDL, LDLCALC, TRIG, CHOLHDL, LDLDIRECT in the last 72 hours. Thyroid Function Tests: Recent Labs    08/26/20 2203  TSH 2.205   Anemia Panel: Recent Labs    08/26/20 2203 08/26/20 2205 08/28/20 0244  VITAMINB12 347  --  516  FOLATE  --  11.8 8.0  FERRITIN  --   --  99  TIBC  --   --  200*  IRON  --   --  22*  RETICCTPCT  --   --  1.5   Sepsis Labs: Recent Labs  Lab 08/27/20 1616 08/27/20 1648 08/27/20 1940 08/28/20 0244 08/28/20 0944 08/28/20 1241 08/29/20 0243  PROCALCITON 2.60  --   --  2.88  --   --  2.17  LATICACIDVEN  --  1.7 2.1*  --  0.8 1.2  --     Recent Results (from the past 240 hour(s))  Blood Culture (routine x 2)     Status: None (Preliminary result)   Collection Time: 08/26/20  3:30 PM   Specimen: BLOOD LEFT HAND  Result Value Ref Range Status   Specimen Description   Final    BLOOD LEFT HAND Performed at [facility], [place] [address] [place], [place] [address]    Special Requests   Final    BOTTLES DRAWN AEROBIC AND ANAEROBIC Blood Culture adequate volume Performed at [place] [address] [place], [place] [address]    Culture   Final    NO GROWTH 3 DAYS Performed at [facility], [place] [address], [place], [place] [address]    Report Status PENDING  Incomplete  Blood Culture (routine x 2)  Status: None (Preliminary result)   Collection Time: 08/26/20  3:30 PM   Specimen: BLOOD  Result Value Ref Range Status   Specimen Description   Final    BLOOD RIGHT ARM Performed at [facility], [address] [place], [place] [address]    Special Requests   Final    BOTTLES DRAWN AEROBIC AND ANAEROBIC Blood Culture results may not be optimal due to an inadequate volume of blood received in culture bottles Performed at [facility], [address], [place], [place] [address]    Culture   Final    NO GROWTH 3 DAYS Performed at [facility], [address] [place], [place] [address]    Report Status PENDING  Incomplete  Urine culture     Status: Abnormal   Collection Time: 08/26/20  3:33 PM   Specimen: In/Out Cath Urine  Result Value Ref Range Status   Specimen Description   Final    IN/OUT CATH URINE Performed at [facility], [address] [place], [place] [address]    Special Requests   Final    NONE Performed at [facility], [address] [place], [place] [address]    Culture (A)  Final    <10,000 COLONIES/mL INSIGNIFICANT GROWTH Performed at [facility], [address] [place], [place] [address]    Report Status 08/28/2020 FINAL  Final  Resp Panel by RT-PCR (Flu A&B, Covid) In/Out Cath Urine     Status: None   Collection Time: 08/26/20  4:30 PM   Specimen: In/Out Cath Urine; Nasopharyngeal(NP) swabs in vial transport medium  Result Value Ref Range Status   SARS Coronavirus 2 by RT PCR NEGATIVE NEGATIVE Final    Comment: (NOTE) SARS-CoV-2 target nucleic acids are NOT DETECTED.  The SARS-CoV-2 RNA is generally detectable in upper respiratory specimens during the acute phase of infection. The lowest concentration of SARS-CoV-2 viral copies this assay can detect is 138 copies/mL. A negative result does not preclude SARS-Cov-2 infection and should not be used as the sole basis for treatment or other patient management decisions. A negative result may occur with  improper specimen collection/handling, submission of specimen other than nasopharyngeal swab, presence of viral  mutation(s) within the areas targeted by this assay, and inadequate number of viral copies(<138 copies/mL). A negative result must be combined with clinical observations, patient history, and epidemiological information. The expected result is Negative.  Fact Sheet for Patients:  BloggerCourse.com  Fact Sheet for Healthcare Providers:  SeriousBroker.it  This test is no t yet approved or cleared by the [place] FDA and  has been authorized for detection and/or diagnosis of SARS-CoV-2 by FDA under an Emergency Use Authorization (EUA). This EUA will remain  in effect (meaning this test can be used) for the duration of the COVID-19 declaration under Section 564(b)(1) of the Act, 21 U.S.C.section 360bbb-3(b)(1), unless the authorization is terminated  or revoked sooner.       Influenza A by PCR NEGATIVE NEGATIVE Final   Influenza B by PCR NEGATIVE NEGATIVE Final    Comment: (NOTE) The Xpert Xpress SARS-CoV-2/FLU/RSV plus assay is intended as an aid in the diagnosis of influenza from Nasopharyngeal swab specimens and should not be used as a sole basis for treatment. Nasal washings and aspirates are unacceptable for Xpert Xpress SARS-CoV-2/FLU/RSV testing.  Fact Sheet for Patients: BloggerCourse.com  Fact Sheet for Healthcare Providers: SeriousBroker.it  This test is not yet approved or  cleared by the [place] and has been authorized for detection and/or diagnosis of SARS-CoV-2 by FDA under an Emergency Use Authorization (EUA). This EUA will remain in effect (meaning this test can be used) for the duration of the COVID-19 declaration under Section 564(b)(1) of the Act, 21 U.S.C. section 360bbb-3(b)(1), unless the authorization is terminated or revoked.  Performed at [facility], [place] [address], [place], [place] [address]   MRSA PCR Screening      Status: None   Collection Time: 08/27/20 12:22 PM   Specimen: Nasal Mucosa; Nasopharyngeal  Result Value Ref Range Status   MRSA by PCR NEGATIVE NEGATIVE Final    Comment:        The GeneXpert MRSA Assay (FDA approved for NASAL specimens only), is one component of a comprehensive MRSA colonization surveillance program. It is not intended to diagnose MRSA infection nor to guide or monitor treatment for MRSA infections. Performed at [facility], [place] [address], [place], [place] [address]          Radiology Studies: MR FOOT LEFT WO CONTRAST  Result Date: 08/28/2020 CLINICAL DATA:  Osteomyelitis of the foot EXAM: MRI OF THE LEFT FOOT WITHOUT CONTRAST TECHNIQUE: Multiplanar, multisequence MR imaging of the left forefoot was performed. No intravenous contrast was administered. COMPARISON:  Radiographs 08/26/2020 FINDINGS: Despite efforts by the technologist and patient, severe motion artifact is present on today's exam and could not be eliminated. This reduces exam sensitivity and specificity. The patient terminated the exam prior to the acquisition of sagittal images and would not allow repeat series to correct for the severely motion degraded images. This reduces overall exam sensitivity and specificity. Bones/Joint/Cartilage Irregular erosions along the articular surfaces of the first MTP joint. Prominent erosions and irregularity in the distal half of the proximal phalanx of the great toe, and in the base of the distal phalanx. No overt malalignment at the Lisfranc joint. There are degenerative findings along the Lisfranc joint. No bony destructive findings at the fifth MTP joint identified. No joint effusion is identified. The phalanges of the fifth toe are somewhat obscured by motion artifact. My sense is that there is probably some soft tissue swelling in the small toe but on the images not severely adversely affected by motion artifact, I not see clear evidence of  osteomyelitis. Small erosion of the base of the distal phalanx fourth toe. Ligaments The Lisfranc ligament appears to be intact. Muscles and Tendons Generalized muscular atrophy in the forefoot. Low-grade edema is likely neurogenic. Soft tissues Circumferential edema in the forefoot but especially dorsally, cellulitis is not excluded. No drainable fluid collection is identified in the soft tissues. IMPRESSION: 1. Prominent erosions and irregularity along the articular surfaces of the first MTP joint and in the distal half of the proximal phalanx of the great toe, and in the base of the distal phalanx fourth toe. The phalanges of the fifth toe are somewhat obscured by motion artifact, but I not see clear evidence of osteomyelitis. 2. Small erosion of the base of the distal phalanx fourth toe. 3. Circumferential edema in the forefoot, especially dorsally, cellulitis is not excluded. No drainable fluid collection is identified. 4. Generalized muscular atrophy in the forefoot. Low-grade edema is likely neurogenic. 5. Despite efforts by the technologist and patient, severe motion artifact is present on today's exam and could not be eliminated. This reduces exam sensitivity and specificity. The patient terminated the exam prior to the acquisition of sagittal images and would not  allow repeat series to correct for the severely motion degraded images. Electronically Signed   By: Van Clines M.D.   On: 08/28/2020 13:36        Scheduled Meds: . (feeding supplement) PROSource Plus  30 mL Oral BID BM  . allopurinol  100 mg Oral Daily  . aspirin EC  162 mg Oral BID  . atorvastatin  10 mg Oral Daily  . calcitRIOL  0.25 mcg Oral Daily  . carvedilol  25 mg Oral BID WC  . Chlorhexidine Gluconate Cloth  6 each Topical Daily  . heparin  5,000 Units Subcutaneous Q8H  . hydrALAZINE  50 mg Oral TID  . insulin aspart  0-5 Units Subcutaneous QHS  . insulin aspart  0-9 Units Subcutaneous TID WC  . insulin aspart  3  Units Subcutaneous TID WC  . insulin glargine  5 Units Subcutaneous Daily  . latanoprost  1 drop Both Eyes QHS  . linagliptin  5 mg Oral Daily  . mouth rinse  15 mL Mouth Rinse BID  . multivitamin with minerals  1 tablet Oral Daily  . mycophenolate  500 mg Oral BID  . nutrition supplement (JUVEN)  1 packet Oral BID BM  . [START ON 08/30/2020] predniSONE  5 mg Oral Daily  . sodium chloride flush  3 mL Intravenous Q12H  . sodium chloride flush  3 mL Intravenous Q12H  . tacrolimus  1 mg Oral BID   Continuous Infusions: . sodium chloride    . cefTRIAXone (ROCEPHIN)  IV Stopped (08/28/20 2337)  . dextrose 5 % and 0.9% NaCl 100 mL/hr at 08/29/20 0836  . nitroGLYCERIN 75 mcg/min (08/29/20 0836)     LOS: 3 days    Time spent:40 min    Juanjesus Pepperman, Geraldo Docker, MD [facility] Pager [phone]  If 7PM-7AM, please contact night-coverage [email or web address] Password [facility] 08/29/2020, 8:36 AM

## 2020-08-29 NOTE — Progress Notes (Signed)
Updated MD that patient is too lethargic to take PO meds at this time. Will attempt again later

## 2020-08-29 NOTE — Progress Notes (Signed)
ABI w/ TBI study completed.  Preliminary results relayed to RN.   See CV Proc for preliminary results report.   Darlin Coco, RDMS

## 2020-08-29 NOTE — Progress Notes (Signed)
MD notified that BP continued to trend down. Nitro gtt turned off. Pt also very lethargic. Notified of temp 102, treated with tylenol. MD came to bedside. See new orders for interventions

## 2020-08-30 DIAGNOSIS — I5041 Acute combined systolic (congestive) and diastolic (congestive) heart failure: Secondary | ICD-10-CM | POA: Diagnosis present

## 2020-08-30 DIAGNOSIS — M1A09X0 Idiopathic chronic gout, multiple sites, without tophus (tophi): Secondary | ICD-10-CM

## 2020-08-30 LAB — COMPREHENSIVE METABOLIC PANEL
ALT: 9 U/L (ref 0–44)
AST: 20 U/L (ref 15–41)
Albumin: 2.9 g/dL — ABNORMAL LOW (ref 3.5–5.0)
Alkaline Phosphatase: 67 U/L (ref 38–126)
Anion gap: 10 (ref 5–15)
BUN: 51 mg/dL — ABNORMAL HIGH (ref 8–23)
CO2: 17 mmol/L — ABNORMAL LOW (ref 22–32)
Calcium: 8.3 mg/dL — ABNORMAL LOW (ref 8.9–10.3)
Chloride: 115 mmol/L — ABNORMAL HIGH (ref 98–111)
Creatinine, Ser: 2.62 mg/dL — ABNORMAL HIGH (ref 0.61–1.24)
GFR, Estimated: 25 mL/min — ABNORMAL LOW (ref >60)
Glucose, Bld: 291 mg/dL — ABNORMAL HIGH (ref 70–99)
Potassium: 4 mmol/L (ref 3.5–5.1)
Sodium: 142 mmol/L (ref 135–145)
Total Bilirubin: 0.8 mg/dL (ref 0.3–1.2)
Total Protein: 5.2 g/dL — ABNORMAL LOW (ref 6.5–8.1)

## 2020-08-30 LAB — CBC WITH DIFFERENTIAL/PLATELET
Abs Immature Granulocytes: 0.05 10*3/uL (ref 0.00–0.07)
Basophils Absolute: 0 10*3/uL (ref 0.0–0.1)
Basophils Relative: 0 %
Eosinophils Absolute: 0 10*3/uL (ref 0.0–0.5)
Eosinophils Relative: 1 %
HCT: 30.5 % — ABNORMAL LOW (ref 39.0–52.0)
Hemoglobin: 8.9 g/dL — ABNORMAL LOW (ref 13.0–17.0)
Immature Granulocytes: 1 %
Lymphocytes Relative: 9 %
Lymphs Abs: 0.8 10*3/uL (ref 0.7–4.0)
MCH: 27.9 pg (ref 26.0–34.0)
MCHC: 29.2 g/dL — ABNORMAL LOW (ref 30.0–36.0)
MCV: 95.6 fL (ref 80.0–100.0)
Monocytes Absolute: 0.9 10*3/uL (ref 0.1–1.0)
Monocytes Relative: 11 %
Neutro Abs: 6.9 10*3/uL (ref 1.7–7.7)
Neutrophils Relative %: 78 %
Platelets: 79 10*3/uL — ABNORMAL LOW (ref 150–400)
RBC: 3.19 MIL/uL — ABNORMAL LOW (ref 4.22–5.81)
RDW: 14.6 % (ref 11.5–15.5)
WBC: 8.6 10*3/uL (ref 4.0–10.5)
nRBC: 0 % (ref 0.0–0.2)

## 2020-08-30 LAB — GLUCOSE, CAPILLARY
Glucose-Capillary: 239 mg/dL — ABNORMAL HIGH (ref 70–99)
Glucose-Capillary: 261 mg/dL — ABNORMAL HIGH (ref 70–99)
Glucose-Capillary: 322 mg/dL — ABNORMAL HIGH (ref 70–99)
Glucose-Capillary: 280 mg/dL — ABNORMAL HIGH (ref 70–99)

## 2020-08-30 LAB — MAGNESIUM: Magnesium: 2 mg/dL (ref 1.7–2.4)

## 2020-08-30 LAB — PHOSPHORUS: Phosphorus: 2.8 mg/dL (ref 2.5–4.6)

## 2020-08-30 MED ORDER — INSULIN GLARGINE 100 UNIT/ML ~~LOC~~ SOLN
10.0000 [IU] | Freq: Every day | SUBCUTANEOUS | Status: DC
  Filled 2020-08-30: qty 0.1

## 2020-08-30 MED ORDER — SODIUM BICARBONATE 8.4 % IV SOLN
INTRAVENOUS | Status: DC
  Filled 2020-08-30: qty 150

## 2020-08-30 MED ORDER — LACTATED RINGERS IV SOLN: INTRAVENOUS | Status: DC

## 2020-08-30 NOTE — Consult Note (Addendum)
[facility]rology Consultation Note  Requesting MD: Dr Sherral Hammers, Vicente Serene Reason for consult: AKI on CKD, kidney transplant   HPI:  [name] is a 74 y.o. male with history of hypertension, HLD, type II DM, CHF, A. fib, ESRD status post kidney transplant in 1983, 2005, CKD 4 with baseline creatinine level around 2..0-2.2, admitted with left foot infection, osteomyelitis, seen as a consultation for further evaluation of AKI on CKD and transplant medication. The patient was hospitalized last in 05/2020 for sepsis when he developed AKI with peak creatinine level of 2.73. He now presents with worsening left foot pain associated with nausea. In ER he has HTN emergency requiring NTG iv. Chest x-ray with cardiomegaly and some edema and the labs showed a creatinine level of 1.85. The blood pressure dropped to systolic 24O after IV nitroglycerin. He was diagnosed with left fifth metatarsal osteomyelitis, sepsis. He was febrile to 101.9, started antibiotics.  The MRI of foot without clear evidence of osteomyelitis however has some cellulitis. Seen by Dr. Sharol Given not recommending surgical intervention however started steroid for gout treatment. He follows with Dr. Marval Regal at [facility] for CKD/kidney transplant. He is on Prograf 1mg  bid, CellCept 500 mg bid and prednisone 5 mg. Currently patient is quite sedated and lethargic therefore review of system is limited. Urine output is around 400 cc. The labs showed that creatinine level went up to 2.62 today. He was started on LR. The urinalysis with proteinuria without RBCs. CT scan with unremarkable transplant kidney.  Creatinine, Ser  Date/Time Value Ref Range Status  08/30/2020 02:21 AM 2.62 (H) 0.61 - 1.24 mg/dL Final  08/29/2020 02:43 AM 2.27 (H) 0.61 - 1.24 mg/dL Final  08/28/2020 02:44 AM 2.37 (H) 0.61 - 1.24 mg/dL Final  08/27/2020 12:27 PM 1.92 (H) 0.61 - 1.24 mg/dL Final  08/27/2020 12:27 PM 2.09 (H) 0.61 - 1.24  mg/dL Final  08/26/2020 03:30 PM 1.85 (H) 0.61 - 1.24 mg/dL Final  06/06/2020 04:00 AM 1.66 (H) 0.61 - 1.24 mg/dL Final  06/05/2020 03:38 AM 2.02 (H) 0.61 - 1.24 mg/dL Final  06/04/2020 09:11 AM 2.43 (H) 0.61 - 1.24 mg/dL Final  06/03/2020 04:00 AM 2.73 (H) 0.61 - 1.24 mg/dL Final    PMHx:   Past Medical History:  Diagnosis Date  . Anemia   . Atrial fibrillation ([place])   . CHF (congestive heart failure) ([place])   . Chronic kidney disease   . Diabetes mellitus   . Dysrhythmia   . Hyperlipidemia   . Hypertension   . Neuropathy   . Wound of left leg 08/2018    Past Surgical History:  Procedure Laterality Date  . ABDOMINAL AORTOGRAM W/LOWER EXTREMITY N/A 10/12/2019   Procedure: ABDOMINAL AORTOGRAM W/LOWER EXTREMITY;  Surgeon: Angelia Mould, MD;  Location: [facility];  Service: Cardiovascular;  Laterality: N/A;  With CO2  . access proceds    . achlles tendon repair    . AMPUTATION Right 05/22/2020   Procedure: RIGHT INDEX FINGER DEBRIDEMENT AND AMPUTATION;  Surgeon: Leanora Cover, MD;  Location: [facility];  Service: Orthopedics;  Laterality: Right;  . ARTERIOVENOUS GRAFT PLACEMENT    . bowel repla     repair  . ENDOVENOUS ABLATION SAPHENOUS VEIN W/ LASER Right 10/04/2019   endovenous laser ablation right greater saphenous vein by Gae Gallop MD   . [place] and 2005  . LIGATION GORETEX GRAFT  06/30/11   Right upper arm  Family Hx:  Family History  Problem Relation Age of Onset  . Heart disease Mother   . Heart attack Father   . Stroke Sister     Social History:  reports that he has never smoked. He has never used smokeless tobacco. He reports that he does not drink alcohol and does not use drugs.  Allergies:  Allergies  Allergen Reactions  . Elavil [Amitriptyline Hcl] Other (See Comments)    Unknown felt as if he was tripping    Medications: Prior to Admission medications   Medication Sig Start Date End Date  Taking? Authorizing Provider  acetaminophen (TYLENOL) 500 MG tablet Take 1,000 mg by mouth every 6 (six) hours as needed for mild pain or moderate pain.   Yes [provider]  allopurinol (ZYLOPRIM) 100 MG tablet Take 1 tablet (100 mg total) by mouth daily. 06/06/20 06/06/21 Yes Florencia Reasons, MD  aspirin 81 MG tablet Take 162 mg by mouth 2 (two) times daily.    Yes [provider]  atorvastatin (LIPITOR) 10 MG tablet Take 10 mg by mouth daily.   Yes [provider]  calcitRIOL (ROCALTROL) 0.25 MCG capsule Take 0.25 mcg by mouth daily.  07/22/15  Yes [provider]  carvedilol (COREG) 6.25 MG tablet Take 1 tablet (6.25 mg total) by mouth 2 (two) times daily with a meal. 01/29/20  Yes Burtis Junes, NP  diphenhydrAMINE (BENADRYL) 25 mg capsule Take 1 capsule (25 mg total) by mouth at bedtime as needed (for sleep/allergies.). 09/11/18  Yes Regalado, Belkys A, MD  doxycycline (VIBRAMYCIN) 100 MG capsule Take 100 mg by mouth 2 (two) times daily. 10 DS 08/22/20  Yes [provider]  furosemide (LASIX) 40 MG tablet Take 1 tablet (40 mg total) by mouth daily. Patient taking differently: Take 80 mg by mouth daily.  06/06/20  Yes Florencia Reasons, MD  gabapentin (NEURONTIN) 100 MG capsule Take 100 mg by mouth See admin instructions. Take 1 capule (100mg ) three times/day as needed for neuropathy or 1 capsule (100mg ) at bedtime   Yes [provider]  HUMALOG KWIKPEN 100 UNIT/ML KwikPen Inject 3-5 Units into the skin 3 (three) times daily. Sliding scale 07/17/18  Yes [provider]  hydrALAZINE (APRESOLINE) 25 MG tablet Take 25 mg by mouth 3 (three) times daily.  08/22/18  Yes [provider]  icosapent Ethyl (VASCEPA) 1 g capsule Take 2 g by mouth 2 (two) times daily.   Yes [provider]  LANTUS SOLOSTAR 100 UNIT/ML Solostar Pen Inject 7-15 Units into the skin daily. If blood sugars are >300, use 15 units, <300 use 7 units 06/04/14  Yes [provider]  latanoprost (XALATAN) 0.005 % ophthalmic solution Place 1 drop into both eyes at bedtime. 07/28/18  Yes [provider]  linagliptin (TRADJENTA) 5 MG TABS tablet Take 5 mg by mouth daily.     Yes [provider]  mycophenolate (CELLCEPT) 500 MG tablet Take 500 mg by mouth 2 (two) times daily.    Yes [provider]  oxyCODONE-acetaminophen (PERCOCET/ROXICET) 5-325 MG tablet Take 1-2 tablets by mouth every 6 (six) hours as needed for moderate pain.  05/23/20  Yes [provider]  predniSONE (DELTASONE) 5 MG tablet Take 5 mg by mouth daily. 07/07/18  Yes [provider]  tacrolimus (PROGRAF) 1 MG capsule Take 1 mg by mouth 2 (two) times daily.    Yes [provider]  Alcohol Swabs (B-D SINGLE USE SWABS REGULAR) PADS Use as  directed 09/07/15   [provider]  BAYER CONTOUR NEXT TEST test strip Use as directed 09/12/15   [provider]  BD PEN NEEDLE NANO U/F 32G X 4 MM MISC Use as directed 09/25/15   [provider]  [place] Use as directed 07/14/15   [provider]  senna-docusate (SENOKOT-S) 8.6-50 MG tablet Take 1 tablet by mouth at bedtime. Patient not taking: Reported on 08/26/2020 06/06/20   Florencia Reasons, MD    I have reviewed the patient's current medications.  Labs:  Results for orders placed or performed during the hospital encounter of 08/26/20 (from the past 48 hour(s))  Glucose, capillary     Status: Abnormal   Collection Time: 08/28/20 12:21 PM  Result Value Ref Range   Glucose-Capillary 168 (H) 70 - 99 mg/dL    Comment: Glucose reference range applies only to samples taken after fasting for at least 8 hours.  Lactic acid, plasma     Status: None   Collection Time: 08/28/20 12:41 PM  Result Value Ref Range   Lactic Acid, Venous 1.2 0.5 - 1.9 mmol/L    Comment: Performed at [facility], [place] [address] [place], [place] [address]  Glucose, capillary      Status: Abnormal   Collection Time: 08/28/20  4:38 PM  Result Value Ref Range   Glucose-Capillary 132 (H) 70 - 99 mg/dL    Comment: Glucose reference range applies only to samples taken after fasting for at least 8 hours.  Glucose, capillary     Status: Abnormal   Collection Time: 08/28/20  9:49 PM  Result Value Ref Range   Glucose-Capillary 209 (H) 70 - 99 mg/dL    Comment: Glucose reference range applies only to samples taken after fasting for at least 8 hours.  Comprehensive metabolic panel     Status: Abnormal   Collection Time: 08/29/20  2:43 AM  Result Value Ref Range   Sodium 142 135 - 145 mmol/L   Potassium 4.0 3.5 - 5.1 mmol/L   Chloride 112 (H) 98 - 111 mmol/L   CO2 18 (L) 22 - 32 mmol/L   Glucose, Bld 205 (H) 70 - 99 mg/dL    Comment: Glucose reference range applies only to samples taken after fasting for at least 8 hours.   BUN 49 (H) 8 - 23 mg/dL   Creatinine, Ser 2.27 (H) 0.61 - 1.24 mg/dL   Calcium 9.1 8.9 - 10.3 mg/dL   Total Protein 5.6 (L) 6.5 - 8.1 g/dL   Albumin 3.2 (L) 3.5 - 5.0 g/dL   AST 24 15 - 41 U/L   ALT 10 0 - 44 U/L   Alkaline Phosphatase 80 38 - 126 U/L   Total Bilirubin 0.6 0.3 - 1.2 mg/dL   GFR, Estimated 30 (L) >60 mL/min    Comment: (NOTE) Calculated using the CKD-EPI Creatinine Equation (2021)    Anion gap 12 5 - 15    Comment: Performed at [facility], [place] [address], [place], [place] [address]  Magnesium     Status: None   Collection Time: 08/29/20  2:43 AM  Result Value Ref Range   Magnesium 1.7 1.7 - 2.4 mg/dL    Comment: Performed at [facility], [place] [address], [place], [place] [address]  Phosphorus     Status: Abnormal   Collection Time: 08/29/20  2:43 AM  Result Value Ref Range   Phosphorus 2.1 (L) 2.5 - 4.6 mg/dL  Comment: Performed at [facility], [place] [address], [place], [place] [address]  CBC with Differential/Platelet     Status: Abnormal   Collection Time:  08/29/20  2:43 AM  Result Value Ref Range   WBC 6.8 4.0 - 10.5 K/uL   RBC 3.42 (L) 4.22 - 5.81 MIL/uL   Hemoglobin 9.8 (L) 13.0 - 17.0 g/dL   HCT 33.1 (L) 39.0 - 52.0 %   MCV 96.8 80.0 - 100.0 fL   MCH 28.7 26.0 - 34.0 pg   MCHC 29.6 (L) 30.0 - 36.0 g/dL   RDW 14.7 11.5 - 15.5 %   Platelets 116 (L) 150 - 400 K/uL    Comment: Immature Platelet Fraction may be clinically indicated, consider ordering this additional test [id number] CONSISTENT WITH PREVIOUS RESULT    nRBC 0.0 0.0 - 0.2 %   Neutrophils Relative % 71 %   Neutro Abs 4.8 1.7 - 7.7 K/uL   Lymphocytes Relative 16 %   Lymphs Abs 1.1 0.7 - 4.0 K/uL   Monocytes Relative 10 %   Monocytes Absolute 0.7 0.1 - 1.0 K/uL   Eosinophils Relative 3 %   Eosinophils Absolute 0.2 0.0 - 0.5 K/uL   Basophils Relative 0 %   Basophils Absolute 0.0 0.0 - 0.1 K/uL   Immature Granulocytes 0 %   Abs Immature Granulocytes 0.03 0.00 - 0.07 K/uL    Comment: Performed at [facility], [place] [address], [place], [place] [address]  Procalcitonin     Status: None   Collection Time: 08/29/20  2:43 AM  Result Value Ref Range   Procalcitonin 2.17 ng/mL    Comment:        Interpretation: PCT > 2 ng/mL: Systemic infection (sepsis) is likely, unless other causes are known. (NOTE)       Sepsis PCT Algorithm           Lower Respiratory Tract                                      Infection PCT Algorithm    ----------------------------     ----------------------------         PCT < 0.25 ng/mL                PCT < 0.10 ng/mL          Strongly encourage             Strongly discourage   discontinuation of antibiotics    initiation of antibiotics    ----------------------------     -----------------------------       PCT 0.25 - 0.50 ng/mL            PCT 0.10 - 0.25 ng/mL               OR       >80% decrease in PCT            Discourage initiation of                                            antibiotics      Encourage discontinuation            of antibiotics    ----------------------------     -----------------------------  PCT >= 0.50 ng/mL              PCT 0.26 - 0.50 ng/mL               AND       <80% decrease in PCT              Encourage initiation of                                             antibiotics       Encourage continuation           of antibiotics    ----------------------------     -----------------------------        PCT >= 0.50 ng/mL                  PCT > 0.50 ng/mL               AND         increase in PCT                  Strongly encourage                                      initiation of antibiotics    Strongly encourage escalation           of antibiotics                                     -----------------------------                                           PCT <= 0.25 ng/mL                                                 OR                                        > 80% decrease in PCT                                      Discontinue / Do not initiate                                             antibiotics  Performed at [place] [address], [place], [place] [address]   Glucose, capillary     Status: Abnormal   Collection Time: 08/29/20  7:42 AM  Result Value Ref Range   Glucose-Capillary 229 (H) 70 - 99 mg/dL    Comment: Glucose reference range applies only to samples taken after fasting for at least 8 hours.  Glucose, capillary     Status: Abnormal   Collection Time: 08/29/20 12:19 PM  Result Value Ref Range   Glucose-Capillary 130 (H) 70 - 99 mg/dL    Comment: Glucose reference range applies only to samples taken after fasting for at least 8 hours.  Glucose, capillary     Status: Abnormal   Collection Time: 08/29/20  4:01 PM  Result Value Ref Range   Glucose-Capillary 153 (H) 70 - 99 mg/dL    Comment: Glucose reference range applies only to samples taken after fasting for at least 8 hours.  Glucose, capillary     Status: Abnormal    Collection Time: 08/29/20  9:17 PM  Result Value Ref Range   Glucose-Capillary 237 (H) 70 - 99 mg/dL    Comment: Glucose reference range applies only to samples taken after fasting for at least 8 hours.   Comment 1 Notify RN   Comprehensive metabolic panel     Status: Abnormal   Collection Time: 08/30/20  2:21 AM  Result Value Ref Range   Sodium 142 135 - 145 mmol/L   Potassium 4.0 3.5 - 5.1 mmol/L   Chloride 115 (H) 98 - 111 mmol/L   CO2 17 (L) 22 - 32 mmol/L   Glucose, Bld 291 (H) 70 - 99 mg/dL    Comment: Glucose reference range applies only to samples taken after fasting for at least 8 hours.   BUN 51 (H) 8 - 23 mg/dL   Creatinine, Ser 2.62 (H) 0.61 - 1.24 mg/dL   Calcium 8.3 (L) 8.9 - 10.3 mg/dL   Total Protein 5.2 (L) 6.5 - 8.1 g/dL   Albumin 2.9 (L) 3.5 - 5.0 g/dL   AST 20 15 - 41 U/L   ALT 9 0 - 44 U/L   Alkaline Phosphatase 67 38 - 126 U/L   Total Bilirubin 0.8 0.3 - 1.2 mg/dL   GFR, Estimated 25 (L) >60 mL/min    Comment: (NOTE) Calculated using the CKD-EPI Creatinine Equation (2021)    Anion gap 10 5 - 15    Comment: Performed at [facility], [place] [address], [place], [place] [address]  Magnesium     Status: None   Collection Time: 08/30/20  2:21 AM  Result Value Ref Range   Magnesium 2.0 1.7 - 2.4 mg/dL    Comment: Performed at [facility], [place] [address], [place], [place] [address]  Phosphorus     Status: None   Collection Time: 08/30/20  2:21 AM  Result Value Ref Range   Phosphorus 2.8 2.5 - 4.6 mg/dL    Comment: Performed at [facility], [place] [address] [place], [place] [address]  CBC with Differential/Platelet     Status: Abnormal   Collection Time: 08/30/20  2:21 AM  Result Value Ref Range   WBC 8.6 4.0 - 10.5 K/uL   RBC 3.19 (L) 4.22 - 5.81 MIL/uL   Hemoglobin 8.9 (L) 13.0 - 17.0 g/dL   HCT 30.5 (L) 39.0 - 52.0 %   MCV 95.6 80.0 - 100.0 fL   MCH 27.9 26.0 - 34.0 pg   MCHC 29.2 (L) 30.0 - 36.0  g/dL   RDW 14.6 11.5 - 15.5 %   Platelets 79 (L) 150 - 400 K/uL    Comment: REPEATED TO VERIFY PLATELET COUNT CONFIRMED BY SMEAR SPECIMEN CHECKED FOR CLOTS Immature Platelet Fraction may be clinically indicated, consider ordering this additional test [id number]    nRBC 0.0 0.0 - 0.2 %  Neutrophils Relative % 78 %   Neutro Abs 6.9 1.7 - 7.7 K/uL   Lymphocytes Relative 9 %   Lymphs Abs 0.8 0.7 - 4.0 K/uL   Monocytes Relative 11 %   Monocytes Absolute 0.9 0.1 - 1.0 K/uL   Eosinophils Relative 1 %   Eosinophils Absolute 0.0 0.0 - 0.5 K/uL   Basophils Relative 0 %   Basophils Absolute 0.0 0.0 - 0.1 K/uL   Immature Granulocytes 1 %   Abs Immature Granulocytes 0.05 0.00 - 0.07 K/uL    Comment: Performed at [facility], [place] [address] [place], [place] [address]  Glucose, capillary     Status: Abnormal   Collection Time: 08/30/20  7:41 AM  Result Value Ref Range   Glucose-Capillary 239 (H) 70 - 99 mg/dL    Comment: Glucose reference range applies only to samples taken after fasting for at least 8 hours.     ROS: Review of system limited as patient is confused and encephalopathic.  Physical Exam: Vitals:   08/30/20 1000 08/30/20 1027  BP: (!) 88/36 (!) 117/37  Pulse: 73 73  Resp: (!) 25 (!) 22  Temp:    SpO2: 97% 90%     General exam: Appears calm and comfortable  Respiratory system: Clear to auscultation. Respiratory effort normal. No wheezing or crackle Cardiovascular system: S1 & S2 heard, RRR.  No pedal edema. Gastrointestinal system: Abdomen is nondistended, soft and nontender. Normal bowel sounds heard. Central nervous system: Alert and oriented. No focal neurological deficits. Extremities: Symmetric 5 x 5 power. Skin: No rashes, lesions or ulcers Psychiatry: Judgement and insight appear normal. Mood & affect appropriate.   Assessment/Plan:  #AKI on CKD stage IV in transplant kidney likely due to sepsis/hemodynamic changes related with  hypertensive urgency on admission and then hypotension after NTG IV. Baseline creatinine level seems to be around 2.08-2.4 UA with proteinuria, CT scan showed unremarkable transplant kidney. He has external urinary catheter, order bladder scan, strict ins and out Currently on gentle IV fluid, watch respiratory status. He is not eating much because of encephalopathy. Holding Lasix. Daily lab. Avoid hypotension, contrast or nephrotoxins.  #History of kidney transplant x2 , last in 2005: Continue Prograf 1 mg twice a day. I will hold CellCept given sepsis and infection. He is on higher dose of prednisone currently for the treatment of gout.  CT scan with unremarkable transplant kidney.  #Acute metabolic encephalopathy: CT scan with no acute finding however he does have advanced atherosclerosis. Minimize sedatives.  #Possible gout flare: Uric acid level elevated. On  Steroid.  #Hypertension: Required nitroglycerin drip on admission. Continue carvedilol. Diuretics on hold. Monitor blood pressure.  Thank you for the consult, we will follow with you.   [name] 08/30/2020, 11:35 AM  [employer].

## 2020-08-30 NOTE — Progress Notes (Signed)
Swelling noted to bilateral upper extremities, arms incredibly tender. Patient screams if arms are touched. Incredibly weak pulses in bilateral extremities requiring doppler to confirm them. MD notified.

## 2020-08-30 NOTE — Progress Notes (Signed)
PROGRESS NOTE    [name]  QBV:[id number] DOB: [birth year] DOA: 08/26/2020 PCP: [name], MD     Brief Narrative:   [name] is a 74 y.o. 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  Patient had reportedly been complaining of some left foot pain and nausea last night but otherwise seem to be in his usual state when his wife left the house today but she was later called by a home health worker who reported that the patient was confused.  He was able to open the door for the home health agent but unable to answer any of her basic questions.  Patient is unable to contribute to the history.   ED Course: Upon arrival to the ED, patient is found to be febrile to 39.3 C, saturating 98% on 2 L/min of supplemental oxygen, and hypertensive with systolic pressure as high as 237.  EKG features sinus tachycardia with rate 105.  Noncontrast head CT negative for acute intracranial abnormality.  Chest x-ray notable for cardiomegaly with mild diffuse interstitial edema.  Radiographs of the left foot demonstrate soft tissue swelling about the left fifth MTP without acute underlying bony abnormality.  No acute findings on CT of the abdomen and pelvis.  Chemistry panel features a glucose of 232, bicarbonate 17, and creatinine 1.85.  CBC notable for mild leukocytosis and thrombocytopenia, and a improved normocytic anemia.  Lactic acid is 1.9.  BNP 532.  Blood and urine cultures were collected in the emergency department patient was given 500 cc of saline, 40 mg IV Lasix, vancomycin, cefepime, and Flagyl.  He was started on nitroglycerin infusion for blood pressure.  COVID-19 pcr is negative.   Subjective: 12/11 T-max overnight 38.8 C, patient much more alert today however still confused.  Will follow  some commands.  Does not answer all questions appropriately.   Assessment & Plan: Covid vaccination; vaccinated   Principal Problem:   Sepsis due to cellulitis [facility]) Active Problems:   Kidney transplant status, cadaveric   Essential hypertension   Acute encephalopathy   Anemia due to chronic kidney disease   Diabetes mellitus type 2, insulin dependent (HCC)   Chronic kidney disease, stage 3b (HCC)   Hypertensive emergency   Anemia, unspecified   Chronic renal failure, stage 3b ([place])   Renal transplant, status post   Diabetes mellitus type 2, controlled, with complications ([place])   Diabetic nephropathy ([place])   Pressure injury of skin   Malnutrition of moderate degree   Idiopathic chronic gout of multiple sites without tophus   Systolic and diastolic CHF, acute (HCC)  Sepsis/osteomyelitis LEFT fifth metatarsal -Upon admission patient met criteria for sepsis temp > 38 C, RR> 20, point of infection cellulitis bilateral lower extremity and possible osteomyelitis of LEFT fifth metatarsal  -Continue current antibiotic treatment -Blood cultures pending -MRI LEFT foot; poor MRI secondary to motion artifact however infection, and erosions of metatarsals bone see report below. -Lactic acid/procalcitonin pending we will used to determine narrowing of antibiotic therapy. -12/9 D5-0.9% saline 156m/hr -12/10 ABI bilateral lower extremity pending -12/10 after ABI obtained will contact Dr. DSharol Givenand discuss findings, is this because of sepsis and is surgery warranted? -12/11 discussed case with Dr. DSharol Givenorthopedic surgery who agreed to see patient..  ADDENDUM; received call from Dr. DSharol Given felt that all of patient's  lower extremity pain secondary to his gout and no surgical intervention warranted.   Acute encephalopathy  -Head CT negative and no focal neuro deficits identified  -Not convinced patient encephalopathic.  Patient appears to be hard of hearing as well as a little bit slow however if  you speak loudly and clearly and give him a few minutes to think he answers appropriately. -Tacrolimus level pending -RPR nonreactive -Ammonia WNL -TSH WNL -B12/WNL  -All sedating medication except for gabapentin.  Renal transplant/CKD stage IIIb (baseline Cr ~1.85) -Renally dose all medications -Hold all renal toxic medication -12/11 DC CellCept 500 mg BID per nephrology recommendation -12/10 Increase Prednisone 50 mg daily for possible Gout Flare. (Normal dose 5 mg daily)  -Tacrolimus 1 mg BID Lab Results  Component Value Date   CREATININE 2.62 (H) 08/30/2020   CREATININE 2.27 (H) 08/29/2020   CREATININE 2.37 (H) 08/28/2020   CREATININE 1.92 (H) 08/27/2020   CREATININE 2.09 (H) 08/27/2020  -12/11 DC D5-0.9% saline 167m/hr -12/11 change to LR 568mhr per nephrology recommendation   Anemia (baseline HgB 10.9) -Most likely secondary to CKD, anemia of chronic disease? -Anemia panel pending -Haptoglobin/LDH pending -Fecal occult blood pending  Hypertensive emergency -BP> 180 with chest pain, possible ischemia -Continue nitroglycerin drip -12/9 increase Coreg 25 mg BID  -12/9 Lasix 80 mg daily (hold) renal functioning deteriorating -12/9 increase Hydralazine 50 Mg TID -Strict ins and outs +6.3 L -Daily weight Filed Weights   08/28/20 0500 08/29/20 0500 08/30/20 0500  Weight: 81.3 kg 86.2 kg 87.8 kg   Acute Systolic and Diastolic CHF -Patient asked uncontrolled HTN DC Solu-[place] -September 2021 EF 55 to 60% -See HTN emergency -12/10 echocardiogram LVEF= 40 to 4536%with diastolic dysfunction see results below -12/12 with worsening cardiac function will consult cardiology.  Appears last saw Dr. HeDaneen Schick/17/2020  Chest pain -See hypertensive emergency  Hypotension --12/10 Albumin 25g  X 1   Pleural effusion -Moderate pleural effusion seen on echocardiogram. -12/12 PCXR evaluate to determine if pleural effusion large enough for thoracentesis.  DM type II  controlled with complication/DM nephropathy -12/8 hemoglobin A1c= 6.6 -12l/11 increase Lantus 10 units daily -NovoLog 3 units qac -Sensitive SSI -Tradjenta 5 mg daily  Gout -12/9 Uric acid = 10.7  -When patient's renal function begins to improve we will start colchicine -Other than his LEFT FIFTH METATARSAL NO JOINT PAIN. -Patient on prednisone for his renal transplant; may be a gout flare,See Renal Transplant .  Hypomagnesmia -Magnesium goal> -Magnesium IV 2 g  Bilateral arm pain chronic -Although chronic since patient not able to adequately describe if it is only in the joints will obtain upper extremity Doppler ultrasound R/O DVT   DVT prophylaxis: Heparin subcu Code Status: Full Family Communication:  Status is: Inpatient    Dispo: The patient is from: Home              Anticipated d/c is to: Home              Anticipated d/c date is: 12/12              Patient currently unstable      Consultants:    Procedures/Significant Events:  12/7 PCXR;Cardiomegaly and mild, diffuse interstitial pulmonary opacity, likely edema. No focal airspace opacity 12/7 CT abdomen pelvis W. Wo contrast;-Small bibasilar pleural effusions and compressive atelectasis in the RIGHT lower lobe. -Atrophic native kidneys with old calcified renal transplant in RIGHT iliac fossa. -Transplant kidney LEFT iliac fossa without mass or hydronephrosis. -Scattered  colonic diverticulosis without evidence of diverticulitis. -No definite acute intra-abdominal or intrapelvic abnormalities. 12/10 MRI LEFT foot W0 contrast;-prominent erosions and irregularity along the articular surfaces of the first MTP joint and in the distal half of the proximal phalanx of the great toe, and in the base of the distal phalanx fourth toe. The phalanges of the fifth toe are somewhat obscured by motion artifact, but I not see clear evidence of osteomyelitis. 2. Small erosion of the base of the distal phalanx fourth  toe. 3. Circumferential edema in the forefoot, especially dorsally, cellulitis is not excluded. No drainable fluid collection is identified. 4. Generalized muscular atrophy in the forefoot. Low-grade edema is likely neurogenic. 5. Despite efforts by the technologist and patient, severe motion artifact is present on today's exam and could not be eliminated. This reduces exam sensitivity and specificity. The patient terminated the exam prior to the acquisition of sagittal images and would not allow repeat series to correct for the severely motion degraded images.  12/11 bilateral lower extremity ABIs;Right: Resting right ankle-brachial index indicates noncompressible right  lower extremity arteries.   Absent TBI.  Left: Resting left ankle-brachial index indicates noncompressible left  lower extremity arteries.  12/10 Echocardiogram;LVEF=40 to 45%. Left ventricle demonstrates regional wall motion abnormalities.  -Moderate LVH -Grade III diastolic dysfunction   -The inferior wall is akinetic.  Left Atrium: moderately dilated.   Aorta: moderate dilatation of the ascending aorta, measuring 45 mm.  Additional Comments: There is a moderate pleural effusion.    I have personally reviewed and interpreted all radiology studies and my findings are as above.  VENTILATOR SETTINGS:    Cultures 12/7 blood LEFT hand NGTD  12/7 RIGHT arm NGTD  12/7 urine insignificant growth 12/8 MRSA by PCR negative    Antimicrobials: Anti-infectives (From admission, onward)   Start     Ordered Stop   08/26/20 2359  cefTRIAXone (ROCEPHIN) 1 g in sodium chloride 0.9 % 100 mL IVPB        08/26/20 2028     08/26/20 1545  ceFEPIme (MAXIPIME) 2 g in sodium chloride 0.9 % 100 mL IVPB        08/26/20 1532 08/26/20 1631   08/26/20 1545  metroNIDAZOLE (FLAGYL) tablet 500 mg  Status:  Discontinued        08/26/20 1532 08/26/20 2015   08/26/20 1545  vancomycin (VANCOCIN) IVPB 1000 mg/200 mL premix         08/26/20 1532 08/26/20 1823       Devices    LINES / TUBES:      Continuous Infusions: . sodium chloride    . cefTRIAXone (ROCEPHIN)  IV Stopped (08/30/20 0011)  . lactated ringers 50 mL/hr at 08/30/20 1400  . nitroGLYCERIN Stopped (08/29/20 0846)     Objective: Vitals:   08/30/20 1400 08/30/20 1600 08/30/20 1604 08/30/20 1800  BP: (!) 136/51 (!) 139/56 (!) 139/56 (!) 121/53  Pulse: 72 71  71  Resp: (!) 24 18  16   Temp:  98.5 F (36.9 C)    TempSrc:  Axillary    SpO2: 100% 99%  93%  Weight:        Intake/Output Summary (Last 24 hours) at 08/30/2020 1915 Last data filed at 08/30/2020 1400 Gross per 24 hour  Intake 2157.28 ml  Output 975 ml  Net 1182.28 ml   Filed Weights   08/28/20 0500 08/29/20 0500 08/30/20 0500  Weight: 81.3 kg 86.2 kg 87.8 kg   Physical Exam:  General: Sleepy, but arousable  however does not make a lot of sense when you ask him questions.  Does not follow all commands No acute respiratory distress Eyes: negative scleral hemorrhage, negative anisocoria, negative icterus ENT: Negative Runny nose, negative gingival bleeding, Neck:  Negative scars, masses, torticollis, lymphadenopathy, JVD Lungs: Clear to auscultation bilaterally without wheezes or crackles Cardiovascular: Regular rate and rhythm without murmur gallop or rub normal S1 and S2 Abdomen: negative abdominal pain, nondistended, positive soft, bowel sounds, no rebound, no ascites, no appreciable mass Extremities: pain whenever left lower extremity straightened, or knee joint, ankle joint, toes palpated.  Due to patient's sleepiness unable to pinpoint exactly the worst point of tenderness. patient with multiple ulcers on bilateral lower extremities, RIGHT>>> LEFT.  Left fifth metatarsal appears to have lesion to the bone, warm to touch painful to palpation.  Also complaining of bilateral upper extremity pain to palpation at one point states pain in the joints however unsure as patient also  hollers with pain when you touch his upper arm and forearm. Skin: See extremities Psychiatric: Unable to evaluate Central nervous system: Response to painful stimuli, follows some commands  .     Data Reviewed: Care during the described time interval was provided by me .  I have reviewed this patient's available data, including medical history, events of note, physical examination, and all test results as part of my evaluation.  CBC: Recent Labs  Lab 08/26/20 1530 08/27/20 1227 08/28/20 0244 08/29/20 0243 08/30/20 0221  WBC 10.7* 8.0 7.5 6.8 8.6  NEUTROABS 8.5*  --  6.0 4.8 6.9  HGB 10.9* 9.8* 8.4* 9.8* 8.9*  HCT 37.6* 33.3* 28.5* 33.1* 30.5*  MCV 96.4 96.0 96.6 96.8 95.6  PLT 120* 102* 91* 116* 79*   Basic Metabolic Panel: Recent Labs  Lab 08/26/20 1530 08/27/20 1227 08/28/20 0244 08/29/20 0243 08/30/20 0221  NA 145 145  146* 142 142 142  K 4.7 4.1  4.1 3.8 4.0 4.0  CL 116* 116*  115* 112* 112* 115*  CO2 17* 16*  18* 19* 18* 17*  GLUCOSE 232* 241*  243* 147* 205* 291*  BUN 32* 37*  37* 42* 49* 51*  CREATININE 1.85* 2.09*  1.92* 2.37* 2.27* 2.62*  CALCIUM 9.8 9.6  9.5 9.1 9.1 8.3*  MG  --  1.7 1.6* 1.7 2.0  PHOS  --  2.6 2.3* 2.1* 2.8   GFR: Estimated Creatinine Clearance: 27.5 mL/min (A) (by C-G formula based on SCr of 2.62 mg/dL (H)). Liver Function Tests: Recent Labs  Lab 08/26/20 1530 08/27/20 1227 08/28/20 0244 08/29/20 0243 08/30/20 0221  AST 28 25 23 24 20   ALT 11 10 9 10 9   ALKPHOS 95 83 61 80 67  BILITOT 1.4* 1.1 1.0 0.6 0.8  PROT 6.5 5.9* 5.0* 5.6* 5.2*  ALBUMIN 4.0 3.4* 2.9* 3.2* 2.9*   No results for input(s): LIPASE, AMYLASE in the last 168 hours. Recent Labs  Lab 08/26/20 2203  AMMONIA 31   Coagulation Profile: Recent Labs  Lab 08/26/20 1530 08/27/20 1654  INR 1.3* 1.3*   Cardiac Enzymes: No results for input(s): CKTOTAL, CKMB, CKMBINDEX, TROPONINI in the last 168 hours. BNP (last 3 results) No results for input(s):  PROBNP in the last 8760 hours. HbA1C: No results for input(s): HGBA1C in the last 72 hours. CBG: Recent Labs  Lab 08/29/20 1601 08/29/20 2117 08/30/20 0741 08/30/20 1242 08/30/20 1633  GLUCAP 153* 237* 239* 261* 322*   Lipid Profile: No results for input(s): CHOL, HDL, LDLCALC, TRIG, CHOLHDL, LDLDIRECT  in the last 72 hours. Thyroid Function Tests: No results for input(s): TSH, T4TOTAL, FREET4, T3FREE, THYROIDAB in the last 72 hours. Anemia Panel: Recent Labs    08/28/20 0244  VITAMINB12 516  FOLATE 8.0  FERRITIN 99  TIBC 200*  IRON 22*  RETICCTPCT 1.5   Sepsis Labs: Recent Labs  Lab 08/27/20 1616 08/27/20 1648 08/27/20 1940 08/28/20 0244 08/28/20 0944 08/28/20 1241 08/29/20 0243  PROCALCITON 2.60  --   --  2.88  --   --  2.17  LATICACIDVEN  --  1.7 2.1*  --  0.8 1.2  --     Recent Results (from the past 240 hour(s))  Blood Culture (routine x 2)     Status: None (Preliminary result)   Collection Time: 08/26/20  3:30 PM   Specimen: BLOOD LEFT HAND  Result Value Ref Range Status   Specimen Description   Final    BLOOD LEFT HAND Performed at [facility] [address] [place], [place] [address]    Special Requests   Final    BOTTLES DRAWN AEROBIC AND ANAEROBIC Blood Culture adequate volume Performed at [facility] [address] [place], [place] [address]    Culture   Final    NO GROWTH 4 DAYS Performed at [facility], [place] [address], [place], [place] [address]    Report Status PENDING  Incomplete  Blood Culture (routine x 2)     Status: None (Preliminary result)   Collection Time: 08/26/20  3:30 PM   Specimen: BLOOD  Result Value Ref Range Status   Specimen Description   Final    BLOOD RIGHT ARM Performed at [facility] [address], [place], [place] [address]    Special Requests   Final    BOTTLES DRAWN AEROBIC AND ANAEROBIC Blood Culture results may not be optimal due to an  inadequate volume of blood received in culture bottles Performed at [facility] [address] [place], [place] [address]    Culture   Final    NO GROWTH 4 DAYS Performed at [facility], [place] [address] [place], [place] [address]    Report Status PENDING  Incomplete  Urine culture     Status: Abnormal   Collection Time: 08/26/20  3:33 PM   Specimen: In/Out Cath Urine  Result Value Ref Range Status   Specimen Description   Final    IN/OUT CATH URINE Performed at [facility] [address], [place], [place] [address]    Special Requests   Final    NONE Performed at [facility], [place] [address] [place], [place] [address]    Culture (A)  Final    <10,000 COLONIES/mL INSIGNIFICANT GROWTH Performed at [facility] [address], [place], [place] [address]    Report Status 08/28/2020 FINAL  Final  Resp Panel by RT-PCR (Flu A&B, Covid) In/Out Cath Urine     Status: None   Collection Time: 08/26/20  4:30 PM   Specimen: In/Out Cath Urine; Nasopharyngeal(NP) swabs in vial transport medium  Result Value Ref Range Status   SARS Coronavirus 2 by RT PCR NEGATIVE NEGATIVE Final    Comment: (NOTE) SARS-CoV-2 target nucleic acids are NOT DETECTED.  The SARS-CoV-2 RNA is generally detectable in upper respiratory specimens during the acute phase of infection. The lowest concentration of SARS-CoV-2 viral copies this assay can detect is 138 copies/mL. A negative result does not  preclude SARS-Cov-2 infection and should not be used as the sole basis for treatment or other patient management decisions. A negative result may occur with  improper specimen collection/handling, submission of specimen other than nasopharyngeal swab, presence of viral mutation(s) within the areas targeted by this assay, and inadequate number of viral copies(<138 copies/mL). A negative result must be combined with clinical observations, patient  history, and epidemiological information. The expected result is Negative.  Fact Sheet for Patients:  EntrepreneurPulse.com.au  Fact Sheet for Healthcare Providers:  IncredibleEmployment.be  This test is no t yet approved or cleared by the [place] FDA and  has been authorized for detection and/or diagnosis of SARS-CoV-2 by FDA under an Emergency Use Authorization (EUA). This EUA will remain  in effect (meaning this test can be used) for the duration of the COVID-19 declaration under Section 564(b)(1) of the Act, 21 U.S.C.section 360bbb-3(b)(1), unless the authorization is terminated  or revoked sooner.       Influenza A by PCR NEGATIVE NEGATIVE Final   Influenza B by PCR NEGATIVE NEGATIVE Final    Comment: (NOTE) The Xpert Xpress SARS-CoV-2/FLU/RSV plus assay is intended as an aid in the diagnosis of influenza from Nasopharyngeal swab specimens and should not be used as a sole basis for treatment. Nasal washings and aspirates are unacceptable for Xpert Xpress SARS-CoV-2/FLU/RSV testing.  Fact Sheet for Patients: EntrepreneurPulse.com.au  Fact Sheet for Healthcare Providers: IncredibleEmployment.be  This test is not yet approved or cleared by the [place] FDA and has been authorized for detection and/or diagnosis of SARS-CoV-2 by FDA under an Emergency Use Authorization (EUA). This EUA will remain in effect (meaning this test can be used) for the duration of the COVID-19 declaration under Section 564(b)(1) of the Act, 21 U.S.C. section 360bbb-3(b)(1), unless the authorization is terminated or revoked.  Performed at [facility], [place] [address], [place], [place] [address]   MRSA PCR Screening     Status: None   Collection Time: 08/27/20 12:22 PM   Specimen: Nasal Mucosa; Nasopharyngeal  Result Value Ref Range Status   MRSA by PCR NEGATIVE NEGATIVE Final    Comment:         The GeneXpert MRSA Assay (FDA approved for NASAL specimens only), is one component of a comprehensive MRSA colonization surveillance program. It is not intended to diagnose MRSA infection nor to guide or monitor treatment for MRSA infections. Performed at [facility], [place] [address], [place], [place] [address]          Radiology Studies: VAS [place] ABI WITH/WO TBI  Result Date: 08/29/2020 LOWER EXTREMITY DOPPLER STUDY Indications: Ulceration- chronic LT foot wound. High Risk Factors: Hypertension, hyperlipidemia, Diabetes.  Comparison Study: 10-12-2019 Abdominal aortogram w/ lower extremity study                   08-09-2019 ABI w/ TBI Performing Technologist: Darlin Coco RDMS  Examination Guidelines: A complete evaluation includes at minimum, Doppler waveform signals and systolic blood pressure reading at the level of bilateral brachial, anterior tibial, and posterior tibial arteries, when vessel segments are accessible. Bilateral testing is considered an integral part of a complete examination. Photoelectric Plethysmograph (PPG) waveforms and toe systolic pressure readings are included as required and additional duplex testing as needed. Limited examinations for reoccurring indications may be performed as noted.  ABI Findings: +---------+------------------+-----+----------+--------------------------------+ Right    Rt Pressure (mmHg)IndexWaveform  Comment                          +---------+------------------+-----+----------+--------------------------------+  Brachial                        triphasic Unable to obtain pressure in RT                                            due to fistula.                  +---------+------------------+-----+----------+--------------------------------+ PTA      255               2.26 monophasic                                 +---------+------------------+-----+----------+--------------------------------+ DP        255               2.26 monophasic                                 +---------+------------------+-----+----------+--------------------------------+ Great Toe                       Absent                                     +---------+------------------+-----+----------+--------------------------------+ +---------+------------------+-----+----------+-------+ Left     Lt Pressure (mmHg)IndexWaveform  Comment +---------+------------------+-----+----------+-------+ Brachial 113                    triphasic         +---------+------------------+-----+----------+-------+ PTA      255               2.26 monophasic        +---------+------------------+-----+----------+-------+ DP       255               2.26 monophasic        +---------+------------------+-----+----------+-------+ Great Toe                       Absent            +---------+------------------+-----+----------+-------+ +-------+-----------+-----------+------------+------------+ ABI/TBIToday's ABIToday's TBIPrevious ABIPrevious TBI +-------+-----------+-----------+------------+------------+ Right  [place]         Absent     [place]          0.40         +-------+-----------+-----------+------------+------------+ Left   [place]         Absent     [place]          0.24         +-------+-----------+-----------+------------+------------+ Arterial wall calcification precludes accurate ankle pressures and ABIs. Right ABIs appear essentially unchanged compared to prior study on 08-09-2019. Left ABIs appear essentially unchanged compared to prior study on 08-09-2019.  Summary: Right: Resting right ankle-brachial index indicates noncompressible right lower extremity arteries. Absent TBI. Left: Resting left ankle-brachial index indicates noncompressible left lower extremity arteries. Absent TBI.  *See table(s) above for measurements and observations.     Preliminary    ECHOCARDIOGRAM COMPLETE  Result Date: 08/29/2020     ECHOCARDIOGRAM REPORT   Patient Name:   JAIMON BUGAJ Date of Exam: 08/29/2020 Medical Rec #:  [id number]  Height:       69.0 in Accession #:    [id number]      Weight:       190.0 lb Date of Birth:  February 24, 1946      BSA:          2.022 m Patient Age:    74 years        BP:           148/62 mmHg Patient Gender: M               HR:           75 bpm. Exam Location:  Inpatient Procedure: 2D Echo, Cardiac Doppler and Color Doppler Indications:    I50.23 Acute on chronic systolic (congestive) heart failure  History:        Patient has prior history of Echocardiogram examinations, most                 recent 05/26/2020. Arrythmias:Atrial Fibrillation; Risk                 Factors:Hypertension, Diabetes and Dyslipidemia. CKD.  Sonographer:    Jonelle Sidle Dance Referring Phys: [id number] [place]  1. Left ventricular ejection fraction, by estimation, is 40 to 45%. The left ventricle has mildly decreased function. The left ventricle demonstrates regional wall motion abnormalities (see scoring diagram/findings for description). There is moderate left ventricular hypertrophy. Left ventricular diastolic parameters are consistent with Grade III diastolic dysfunction (restrictive).  2. Right ventricular systolic function is normal. The right ventricular size is normal.  3. Left atrial size was moderately dilated.  4. Right atrial size was mildly dilated.  5. Moderate pleural effusion.  6. The mitral valve is normal in structure. No evidence of mitral valve regurgitation. No evidence of mitral stenosis. Moderate mitral annular calcification.  7. The aortic valve is normal in structure. Aortic valve regurgitation is mild. No aortic stenosis is present.  8. Aortic dilatation noted. There is mild dilatation at the level of the sinuses of Valsalva, measuring 43 mm. There is moderate dilatation of the ascending aorta, measuring 45 mm.  9. The inferior vena cava is normal in size with greater than 50% respiratory  variability, suggesting right atrial pressure of 3 mmHg. Comparison(s): No significant change from prior study. Prior images reviewed side by side. FINDINGS  Left Ventricle: Left ventricular ejection fraction, by estimation, is 40 to 45%. The left ventricle has mildly decreased function. The left ventricle demonstrates regional wall motion abnormalities. The left ventricular internal cavity size was normal in size. There is moderate left ventricular hypertrophy. Left ventricular diastolic parameters are consistent with Grade III diastolic dysfunction (restrictive).  LV Wall Scoring: The inferior wall is akinetic. Right Ventricle: The right ventricular size is normal. No increase in right ventricular wall thickness. Right ventricular systolic function is normal. Left Atrium: Left atrial size was moderately dilated. Right Atrium: Right atrial size was mildly dilated. Pericardium: The pericardium was not assessed. Mitral Valve: The mitral valve is normal in structure. Moderate mitral annular calcification. No evidence of mitral valve regurgitation. No evidence of mitral valve stenosis. Tricuspid Valve: The tricuspid valve is normal in structure. Tricuspid valve regurgitation is not demonstrated. No evidence of tricuspid stenosis. Aortic Valve: The aortic valve is normal in structure. Aortic valve regurgitation is mild. Aortic regurgitation PHT measures 505 msec. No aortic stenosis is present. Pulmonic Valve: The pulmonic valve was normal in structure. Pulmonic valve regurgitation is not visualized. No evidence of pulmonic stenosis.  Aorta: Aortic dilatation noted. There is mild dilatation at the level of the sinuses of Valsalva, measuring 43 mm. There is moderate dilatation of the ascending aorta, measuring 45 mm. Venous: The inferior vena cava is normal in size with greater than 50% respiratory variability, suggesting right atrial pressure of 3 mmHg. IAS/Shunts: No atrial level shunt detected by color flow Doppler.  Additional Comments: There is a moderate pleural effusion.  LEFT VENTRICLE PLAX 2D LVIDd:         4.60 cm  Diastology LVIDs:         3.50 cm  LV e' medial:    7.83 cm/s LV PW:         1.40 cm  LV E/e' medial:  18.8 LV IVS:        1.20 cm  LV e' lateral:   10.10 cm/s LVOT diam:     2.40 cm  LV E/e' lateral: 14.6 LV SV:         69 LV SV Index:   34 LVOT Area:     4.52 cm  RIGHT VENTRICLE            IVC RV Basal diam:  2.80 cm    IVC diam: 2.70 cm RV S prime:     7.72 cm/s TAPSE (M-mode): 1.4 cm LEFT ATRIUM              Index       RIGHT ATRIUM           Index LA diam:        4.00 cm  1.98 cm/m  RA Area:     21.50 cm LA Vol (A2C):   107.0 ml 52.93 ml/m RA Volume:   71.80 ml  35.52 ml/m LA Vol (A4C):   76.2 ml  37.69 ml/m LA Biplane Vol: 97.1 ml  48.03 ml/m  AORTIC VALVE LVOT Vmax:   94.80 cm/s LVOT Vmean:  60.400 cm/s LVOT VTI:    0.152 m AI PHT:      505 msec  AORTA Ao Root diam: 4.30 cm Ao Asc diam:  4.50 cm MITRAL VALVE MV Area (PHT): 5.27 cm     SHUNTS MV Decel Time: 144 msec     Systemic VTI:  0.15 m MV E velocity: 147.00 cm/s  Systemic Diam: 2.40 cm MV A velocity: 48.00 cm/s MV E/A ratio:  3.06 Candee Furbish MD Electronically signed by Candee Furbish MD Signature Date/Time: 08/29/2020/4:02:57 PM    Final         Scheduled Meds: . (feeding supplement) PROSource Plus  30 mL Oral BID BM  . allopurinol  100 mg Oral Daily  . aspirin EC  162 mg Oral BID  . atorvastatin  10 mg Oral Daily  . calcitRIOL  0.25 mcg Oral Daily  . carvedilol  25 mg Oral BID WC  . Chlorhexidine Gluconate Cloth  6 each Topical Daily  . heparin  5,000 Units Subcutaneous Q8H  . hydrALAZINE  50 mg Oral TID  . insulin aspart  0-5 Units Subcutaneous QHS  . insulin aspart  0-9 Units Subcutaneous TID WC  . insulin aspart  3 Units Subcutaneous TID WC  . [START ON 08/31/2020] insulin glargine  10 Units Subcutaneous Daily  . latanoprost  1 drop Both Eyes QHS  . linagliptin  5 mg Oral Daily  . mouth rinse  15 mL Mouth Rinse BID   . multivitamin with minerals  1 tablet Oral Daily  . nutrition supplement (JUVEN)  1  packet Oral BID BM  . predniSONE  50 mg Oral Daily  . sodium chloride flush  3 mL Intravenous Q12H  . sodium chloride flush  3 mL Intravenous Q12H  . tacrolimus  1 mg Oral BID   Continuous Infusions: . sodium chloride    . cefTRIAXone (ROCEPHIN)  IV Stopped (08/30/20 0011)  . lactated ringers 50 mL/hr at 08/30/20 1400  . nitroGLYCERIN Stopped (08/29/20 0846)     LOS: 4 days    Time spent:40 min    Mylen Mangan, Geraldo Docker, MD [facility] Pager [phone]  If 7PM-7AM, please contact night-coverage [email or web address] Password [facility] 08/30/2020, 7:15 PM

## 2020-08-30 NOTE — Consult Note (Signed)
ORTHOPAEDIC CONSULTATION  REQUESTING PHYSICIAN: Allie Bossier, MD  Chief Complaint: Pain and swelling both feet left worse than right.  HPI: [name] is a 73 y.o. male who presents with multiple medical problems including type 2 diabetes, atrial fibrillation, kidney disease, status post renal transplant, with hypertension and severe peripheral vascular disease.  Patient has extreme pain to light touch of both feet MRI scan has been obtained to rule out the possibility of abscess or osteomyelitis.  Past Medical History:  Diagnosis Date  . Anemia   . Atrial fibrillation ([place])   . CHF (congestive heart failure) ([place])   . Chronic kidney disease   . Diabetes mellitus   . Dysrhythmia   . Hyperlipidemia   . Hypertension   . Neuropathy   . Wound of left leg 08/2018   Past Surgical History:  Procedure Laterality Date  . ABDOMINAL AORTOGRAM W/LOWER EXTREMITY N/A 10/12/2019   Procedure: ABDOMINAL AORTOGRAM W/LOWER EXTREMITY;  Surgeon: Angelia Mould, MD;  Location: [facility];  Service: Cardiovascular;  Laterality: N/A;  With CO2  . access proceds    . achlles tendon repair    . AMPUTATION Right 05/22/2020   Procedure: RIGHT INDEX FINGER DEBRIDEMENT AND AMPUTATION;  Surgeon: Leanora Cover, MD;  Location: [facility];  Service: Orthopedics;  Laterality: Right;  . ARTERIOVENOUS GRAFT PLACEMENT    . bowel repla     repair  . ENDOVENOUS ABLATION SAPHENOUS VEIN W/ LASER Right 10/04/2019   endovenous laser ablation right greater saphenous vein by Gae Gallop MD   . [place] and 2005  . LIGATION GORETEX GRAFT  06/30/11   Right upper arm   Social History   Socioeconomic History  . Marital status: Married    Spouse name: Not on file  . Number of children: Not on file  . Years of education: Not on file  . Highest education level: Not on file  Occupational History  . Not on file  Tobacco Use  . Smoking status: Never Smoker  .  Smokeless tobacco: Never Used  Vaping Use  . Vaping Use: Never used  Substance and Sexual Activity  . Alcohol use: No    Alcohol/week: 0.0 standard drinks  . Drug use: No  . Sexual activity: Not on file  Other Topics Concern  . Not on file  Social History Narrative  . Not on file   Social Determinants of Health   Financial Resource Strain: Not on file  Food Insecurity: Not on file  Transportation Needs: Not on file  Physical Activity: Not on file  Stress: Not on file  Social Connections: Not on file   Family History  Problem Relation Age of Onset  . Heart disease Mother   . Heart attack Father   . Stroke Sister    - negative except otherwise stated in the family history section Allergies  Allergen Reactions  . Elavil [Amitriptyline Hcl] Other (See Comments)    Unknown felt as if he was tripping   Prior to Admission medications   Medication Sig Start Date End Date Taking? Authorizing Provider  acetaminophen (TYLENOL) 500 MG tablet Take 1,000 mg by mouth every 6 (six) hours as needed for mild pain or moderate pain.   Yes [provider]  allopurinol (ZYLOPRIM) 100 MG tablet Take 1 tablet (100 mg total) by mouth daily. 06/06/20 06/06/21 Yes Florencia Reasons, MD  aspirin 81 MG tablet Take 162 mg by mouth 2 (  two) times daily.    Yes [provider]  atorvastatin (LIPITOR) 10 MG tablet Take 10 mg by mouth daily.   Yes [provider]  calcitRIOL (ROCALTROL) 0.25 MCG capsule Take 0.25 mcg by mouth daily.  07/22/15  Yes [provider]  carvedilol (COREG) 6.25 MG tablet Take 1 tablet (6.25 mg total) by mouth 2 (two) times daily with a meal. 01/29/20  Yes Burtis Junes, NP  diphenhydrAMINE (BENADRYL) 25 mg capsule Take 1 capsule (25 mg total) by mouth at bedtime as needed (for sleep/allergies.). 09/11/18  Yes Regalado, Belkys A, MD  doxycycline (VIBRAMYCIN) 100 MG capsule Take 100 mg by mouth 2 (two) times daily. 10 DS 08/22/20  Yes [provider]   furosemide (LASIX) 40 MG tablet Take 1 tablet (40 mg total) by mouth daily. Patient taking differently: Take 80 mg by mouth daily.  06/06/20  Yes Florencia Reasons, MD  gabapentin (NEURONTIN) 100 MG capsule Take 100 mg by mouth See admin instructions. Take 1 capule (100mg ) three times/day as needed for neuropathy or 1 capsule (100mg ) at bedtime   Yes [provider]  HUMALOG KWIKPEN 100 UNIT/ML KwikPen Inject 3-5 Units into the skin 3 (three) times daily. Sliding scale 07/17/18  Yes [provider]  hydrALAZINE (APRESOLINE) 25 MG tablet Take 25 mg by mouth 3 (three) times daily.  08/22/18  Yes [provider]  icosapent Ethyl (VASCEPA) 1 g capsule Take 2 g by mouth 2 (two) times daily.   Yes [provider]  LANTUS SOLOSTAR 100 UNIT/ML Solostar Pen Inject 7-15 Units into the skin daily. If blood sugars are >300, use 15 units, <300 use 7 units 06/04/14  Yes [provider]  latanoprost (XALATAN) 0.005 % ophthalmic solution Place 1 drop into both eyes at bedtime. 07/28/18  Yes [provider]  linagliptin (TRADJENTA) 5 MG TABS tablet Take 5 mg by mouth daily.     Yes [provider]  mycophenolate (CELLCEPT) 500 MG tablet Take 500 mg by mouth 2 (two) times daily.    Yes [provider]  oxyCODONE-acetaminophen (PERCOCET/ROXICET) 5-325 MG tablet Take 1-2 tablets by mouth every 6 (six) hours as needed for moderate pain.  05/23/20  Yes [provider]  predniSONE (DELTASONE) 5 MG tablet Take 5 mg by mouth daily. 07/07/18  Yes [provider]  tacrolimus (PROGRAF) 1 MG capsule Take 1 mg by mouth 2 (two) times daily.    Yes [provider]  Alcohol Swabs (B-D SINGLE USE SWABS REGULAR) PADS Use as directed 09/07/15   [provider]  BAYER CONTOUR NEXT TEST test strip Use as directed 09/12/15   [provider]  BD PEN NEEDLE NANO U/F 32G X 4 MM MISC Use as directed 09/25/15   [provider]   [place] Use as directed 07/14/15   [provider]  senna-docusate (SENOKOT-S) 8.6-50 MG tablet Take 1 tablet by mouth at bedtime. Patient not taking: Reported on 08/26/2020 06/06/20   Florencia Reasons, MD   MR FOOT LEFT WO CONTRAST  Result Date: 08/28/2020 CLINICAL DATA:  Osteomyelitis of the foot EXAM: MRI OF THE LEFT FOOT WITHOUT CONTRAST TECHNIQUE: Multiplanar, multisequence MR imaging of the left forefoot was performed. No intravenous contrast was administered. COMPARISON:  Radiographs 08/26/2020 FINDINGS: Despite efforts by the technologist and patient, severe motion artifact is present on today's exam and could not be eliminated. This reduces exam sensitivity and specificity. The patient terminated the exam prior to the acquisition of  sagittal images and would not allow repeat series to correct for the severely motion degraded images. This reduces overall exam sensitivity and specificity. Bones/Joint/Cartilage Irregular erosions along the articular surfaces of the first MTP joint. Prominent erosions and irregularity in the distal half of the proximal phalanx of the great toe, and in the base of the distal phalanx. No overt malalignment at the Lisfranc joint. There are degenerative findings along the Lisfranc joint. No bony destructive findings at the fifth MTP joint identified. No joint effusion is identified. The phalanges of the fifth toe are somewhat obscured by motion artifact. My sense is that there is probably some soft tissue swelling in the small toe but on the images not severely adversely affected by motion artifact, I not see clear evidence of osteomyelitis. Small erosion of the base of the distal phalanx fourth toe. Ligaments The Lisfranc ligament appears to be intact. Muscles and Tendons Generalized muscular atrophy in the forefoot. Low-grade edema is likely neurogenic. Soft tissues Circumferential edema in the forefoot but especially dorsally, cellulitis is not excluded. No  drainable fluid collection is identified in the soft tissues. IMPRESSION: 1. Prominent erosions and irregularity along the articular surfaces of the first MTP joint and in the distal half of the proximal phalanx of the great toe, and in the base of the distal phalanx fourth toe. The phalanges of the fifth toe are somewhat obscured by motion artifact, but I not see clear evidence of osteomyelitis. 2. Small erosion of the base of the distal phalanx fourth toe. 3. Circumferential edema in the forefoot, especially dorsally, cellulitis is not excluded. No drainable fluid collection is identified. 4. Generalized muscular atrophy in the forefoot. Low-grade edema is likely neurogenic. 5. Despite efforts by the technologist and patient, severe motion artifact is present on today's exam and could not be eliminated. This reduces exam sensitivity and specificity. The patient terminated the exam prior to the acquisition of sagittal images and would not allow repeat series to correct for the severely motion degraded images. Electronically Signed   By: Van Clines M.D.   On: 08/28/2020 13:36   VAS [place] ABI WITH/WO TBI  Result Date: 08/29/2020 LOWER EXTREMITY DOPPLER STUDY Indications: Ulceration- chronic LT foot wound. High Risk Factors: Hypertension, hyperlipidemia, Diabetes.  Comparison Study: 10-12-2019 Abdominal aortogram w/ lower extremity study                   08-09-2019 ABI w/ TBI Performing Technologist: Darlin Coco RDMS  Examination Guidelines: A complete evaluation includes at minimum, Doppler waveform signals and systolic blood pressure reading at the level of bilateral brachial, anterior tibial, and posterior tibial arteries, when vessel segments are accessible. Bilateral testing is considered an integral part of a complete examination. Photoelectric Plethysmograph (PPG) waveforms and toe systolic pressure readings are included as required and additional duplex testing as needed. Limited examinations for  reoccurring indications may be performed as noted.  ABI Findings: +---------+------------------+-----+----------+--------------------------------+ Right    Rt Pressure (mmHg)IndexWaveform  Comment                          +---------+------------------+-----+----------+--------------------------------+ Brachial                        triphasic Unable to obtain pressure in RT  due to fistula.                  +---------+------------------+-----+----------+--------------------------------+ PTA      255               2.26 monophasic                                 +---------+------------------+-----+----------+--------------------------------+ DP       255               2.26 monophasic                                 +---------+------------------+-----+----------+--------------------------------+ Great Toe                       Absent                                     +---------+------------------+-----+----------+--------------------------------+ +---------+------------------+-----+----------+-------+ Left     Lt Pressure (mmHg)IndexWaveform  Comment +---------+------------------+-----+----------+-------+ Brachial 113                    triphasic         +---------+------------------+-----+----------+-------+ PTA      255               2.26 monophasic        +---------+------------------+-----+----------+-------+ DP       255               2.26 monophasic        +---------+------------------+-----+----------+-------+ Great Toe                       Absent            +---------+------------------+-----+----------+-------+ +-------+-----------+-----------+------------+------------+ ABI/TBIToday's ABIToday's TBIPrevious ABIPrevious TBI +-------+-----------+-----------+------------+------------+ Right  [place]         Absent     [place]          0.40          +-------+-----------+-----------+------------+------------+ Left   [place]         Absent     [place]          0.24         +-------+-----------+-----------+------------+------------+ Arterial wall calcification precludes accurate ankle pressures and ABIs. Right ABIs appear essentially unchanged compared to prior study on 08-09-2019. Left ABIs appear essentially unchanged compared to prior study on 08-09-2019.  Summary: Right: Resting right ankle-brachial index indicates noncompressible right lower extremity arteries. Absent TBI. Left: Resting left ankle-brachial index indicates noncompressible left lower extremity arteries. Absent TBI.  *See table(s) above for measurements and observations.     Preliminary    ECHOCARDIOGRAM COMPLETE  Result Date: 08/29/2020    ECHOCARDIOGRAM REPORT   Patient Name:   [name] Date of Exam: 08/29/2020 Medical Rec #:  [id number]       Height:       69.0 in Accession #:    [id number]      Weight:       190.0 lb Date of Birth:  02/12/1946      BSA:          2.022 m Patient Age:    44 years        BP:  148/62 mmHg Patient Gender: M               HR:           75 bpm. Exam Location:  Inpatient Procedure: 2D Echo, Cardiac Doppler and Color Doppler Indications:    I50.23 Acute on chronic systolic (congestive) heart failure  History:        Patient has prior history of Echocardiogram examinations, most                 recent 05/26/2020. Arrythmias:Atrial Fibrillation; Risk                 Factors:Hypertension, Diabetes and Dyslipidemia. CKD.  Sonographer:    Jonelle Sidle Dance Referring Phys: [id number] [place]  1. Left ventricular ejection fraction, by estimation, is 40 to 45%. The left ventricle has mildly decreased function. The left ventricle demonstrates regional wall motion abnormalities (see scoring diagram/findings for description). There is moderate left ventricular hypertrophy. Left ventricular diastolic parameters are consistent with Grade III diastolic  dysfunction (restrictive).  2. Right ventricular systolic function is normal. The right ventricular size is normal.  3. Left atrial size was moderately dilated.  4. Right atrial size was mildly dilated.  5. Moderate pleural effusion.  6. The mitral valve is normal in structure. No evidence of mitral valve regurgitation. No evidence of mitral stenosis. Moderate mitral annular calcification.  7. The aortic valve is normal in structure. Aortic valve regurgitation is mild. No aortic stenosis is present.  8. Aortic dilatation noted. There is mild dilatation at the level of the sinuses of Valsalva, measuring 43 mm. There is moderate dilatation of the ascending aorta, measuring 45 mm.  9. The inferior vena cava is normal in size with greater than 50% respiratory variability, suggesting right atrial pressure of 3 mmHg. Comparison(s): No significant change from prior study. Prior images reviewed side by side. FINDINGS  Left Ventricle: Left ventricular ejection fraction, by estimation, is 40 to 45%. The left ventricle has mildly decreased function. The left ventricle demonstrates regional wall motion abnormalities. The left ventricular internal cavity size was normal in size. There is moderate left ventricular hypertrophy. Left ventricular diastolic parameters are consistent with Grade III diastolic dysfunction (restrictive).  LV Wall Scoring: The inferior wall is akinetic. Right Ventricle: The right ventricular size is normal. No increase in right ventricular wall thickness. Right ventricular systolic function is normal. Left Atrium: Left atrial size was moderately dilated. Right Atrium: Right atrial size was mildly dilated. Pericardium: The pericardium was not assessed. Mitral Valve: The mitral valve is normal in structure. Moderate mitral annular calcification. No evidence of mitral valve regurgitation. No evidence of mitral valve stenosis. Tricuspid Valve: The tricuspid valve is normal in structure. Tricuspid valve  regurgitation is not demonstrated. No evidence of tricuspid stenosis. Aortic Valve: The aortic valve is normal in structure. Aortic valve regurgitation is mild. Aortic regurgitation PHT measures 505 msec. No aortic stenosis is present. Pulmonic Valve: The pulmonic valve was normal in structure. Pulmonic valve regurgitation is not visualized. No evidence of pulmonic stenosis. Aorta: Aortic dilatation noted. There is mild dilatation at the level of the sinuses of Valsalva, measuring 43 mm. There is moderate dilatation of the ascending aorta, measuring 45 mm. Venous: The inferior vena cava is normal in size with greater than 50% respiratory variability, suggesting right atrial pressure of 3 mmHg. IAS/Shunts: No atrial level shunt detected by color flow Doppler. Additional Comments: There is a moderate pleural effusion.  LEFT VENTRICLE PLAX 2D  LVIDd:         4.60 cm  Diastology LVIDs:         3.50 cm  LV e' medial:    7.83 cm/s LV PW:         1.40 cm  LV E/e' medial:  18.8 LV IVS:        1.20 cm  LV e' lateral:   10.10 cm/s LVOT diam:     2.40 cm  LV E/e' lateral: 14.6 LV SV:         69 LV SV Index:   34 LVOT Area:     4.52 cm  RIGHT VENTRICLE            IVC RV Basal diam:  2.80 cm    IVC diam: 2.70 cm RV S prime:     7.72 cm/s TAPSE (M-mode): 1.4 cm LEFT ATRIUM              Index       RIGHT ATRIUM           Index LA diam:        4.00 cm  1.98 cm/m  RA Area:     21.50 cm LA Vol (A2C):   107.0 ml 52.93 ml/m RA Volume:   71.80 ml  35.52 ml/m LA Vol (A4C):   76.2 ml  37.69 ml/m LA Biplane Vol: 97.1 ml  48.03 ml/m  AORTIC VALVE LVOT Vmax:   94.80 cm/s LVOT Vmean:  60.400 cm/s LVOT VTI:    0.152 m AI PHT:      505 msec  AORTA Ao Root diam: 4.30 cm Ao Asc diam:  4.50 cm MITRAL VALVE MV Area (PHT): 5.27 cm     SHUNTS MV Decel Time: 144 msec     Systemic VTI:  0.15 m MV E velocity: 147.00 cm/s  Systemic Diam: 2.40 cm MV A velocity: 48.00 cm/s MV E/A ratio:  3.06 Candee Furbish MD Electronically signed by Candee Furbish MD  Signature Date/Time: 08/29/2020/4:02:57 PM    Final    - pertinent xrays, CT, MRI studies were reviewed and independently interpreted  Positive ROS: All other systems have been reviewed and were otherwise negative with the exception of those mentioned in the HPI and as above.  Physical Exam: General: Alert, no acute distress Psychiatric: Patient is competent for consent with normal mood and affect Lymphatic: No axillary or cervical lymphadenopathy Cardiovascular: No pedal edema Respiratory: No cyanosis, no use of accessory musculature GI: No organomegaly, abdomen is soft and non-tender    Images:  @ENCIMAGES @  Labs:  Lab Results  Component Value Date   HGBA1C 6.6 (H) 08/27/2020   HGBA1C 6.9 (H) 06/02/2020   HGBA1C 6.6 (H) 05/26/2020   ESRSEDRATE 65 (H) 05/31/2020   CRP 24.0 (H) 05/31/2020   LABURIC 10.7 (H) 08/28/2020   LABURIC 8.7 (H) 05/31/2020   LABURIC 10.5 (H) 09/07/2018   REPTSTATUS 08/28/2020 FINAL 08/26/2020   GRAMSTAIN  05/22/2020    RARE WBC PRESENT, PREDOMINANTLY PMN RARE GRAM POSITIVE COCCI    CULT (A) 08/26/2020    <10,000 COLONIES/mL INSIGNIFICANT GROWTH Performed at [facility], [place] [address], [place], [place] [address]    LABORGA PROTEUS MIRABILIS 05/22/2020    Lab Results  Component Value Date   ALBUMIN 2.9 (L) 08/30/2020   ALBUMIN 3.2 (L) 08/29/2020   ALBUMIN 2.9 (L) 08/28/2020   LABURIC 10.7 (H) 08/28/2020   LABURIC 8.7 (H) 05/31/2020   LABURIC 10.5 (H) 09/07/2018  Neurologic: Patient does not have protective sensation bilateral lower extremities.   MUSCULOSKELETAL:   Skin: Examination patient does have swelling of both lower extremities patient denies any pain at rest but with light touch patient has global pain around the ankle and foot on both feet.  Patient's ankle-brachial indices shows severe peripheral vascular disease with noncompressible calcified vessels with monophasic flow to both lower extremities.  Review of the  MRI scan does not show any definite abscess formation or definite osteomyelitis.  Review of the radiographs shows severe calcification of all the arteries in his foot with extension all the way out to the toes.  Patient's uric acid was 10.7.  Assessment: Patient symptoms are consistent with acute gout.  Patient is on allopurinol 100 mg a day,  and prednisone.  Plan: Patient may benefit from colchicine 0.6 mg once a day.  Would adjust this dose according to his decreased renal function.  Thank you for the consult and the opportunity to see [name], [place] [phone] 9:42 AM

## 2020-08-31 ENCOUNTER — Inpatient Hospital Stay (HOSPITAL_COMMUNITY): Payer: Medicare Other

## 2020-08-31 DIAGNOSIS — M79609 Pain in unspecified limb: Secondary | ICD-10-CM

## 2020-08-31 DIAGNOSIS — M7989 Other specified soft tissue disorders: Secondary | ICD-10-CM

## 2020-08-31 LAB — CULTURE, BLOOD (ROUTINE X 2)
Culture: NO GROWTH
Culture: NO GROWTH
Special Requests: ADEQUATE

## 2020-08-31 LAB — CBC WITH DIFFERENTIAL/PLATELET
Abs Immature Granulocytes: 0 10*3/uL (ref 0.00–0.07)
Band Neutrophils: 0 %
Basophils Absolute: 0 10*3/uL (ref 0.0–0.1)
Basophils Relative: 0 %
Blasts: 0 %
Eosinophils Absolute: 0.1 10*3/uL (ref 0.0–0.5)
Eosinophils Relative: 1 %
HCT: 28.3 % — ABNORMAL LOW (ref 39.0–52.0)
Hemoglobin: 8.2 g/dL — ABNORMAL LOW (ref 13.0–17.0)
Lymphocytes Relative: 4 %
Lymphs Abs: 0.4 10*3/uL — ABNORMAL LOW (ref 0.7–4.0)
MCH: 27.5 pg (ref 26.0–34.0)
MCHC: 29 g/dL — ABNORMAL LOW (ref 30.0–36.0)
MCV: 95 fL (ref 80.0–100.0)
Metamyelocytes Relative: 0 %
Monocytes Absolute: 0.6 10*3/uL (ref 0.1–1.0)
Monocytes Relative: 5 %
Myelocytes: 0 %
Neutro Abs: 10 10*3/uL — ABNORMAL HIGH (ref 1.7–7.7)
Neutrophils Relative %: 90 %
Other: 0 %
Platelets: 76 10*3/uL — ABNORMAL LOW (ref 150–400)
Promyelocytes Relative: 0 %
RBC: 2.98 MIL/uL — ABNORMAL LOW (ref 4.22–5.81)
RDW: 14.7 % (ref 11.5–15.5)
WBC: 11.1 10*3/uL — ABNORMAL HIGH (ref 4.0–10.5)
nRBC: 0 % (ref 0.0–0.2)
nRBC: 0 /100{WBCs}

## 2020-08-31 LAB — COMPREHENSIVE METABOLIC PANEL
ALT: 9 U/L (ref 0–44)
AST: 22 U/L (ref 15–41)
Albumin: 2.7 g/dL — ABNORMAL LOW (ref 3.5–5.0)
Alkaline Phosphatase: 65 U/L (ref 38–126)
Anion gap: 12 (ref 5–15)
BUN: 69 mg/dL — ABNORMAL HIGH (ref 8–23)
CO2: 17 mmol/L — ABNORMAL LOW (ref 22–32)
Calcium: 8.7 mg/dL — ABNORMAL LOW (ref 8.9–10.3)
Chloride: 113 mmol/L — ABNORMAL HIGH (ref 98–111)
Creatinine, Ser: 3.03 mg/dL — ABNORMAL HIGH (ref 0.61–1.24)
GFR, Estimated: 21 mL/min — ABNORMAL LOW (ref >60)
Glucose, Bld: 319 mg/dL — ABNORMAL HIGH (ref 70–99)
Potassium: 4.3 mmol/L (ref 3.5–5.1)
Sodium: 142 mmol/L (ref 135–145)
Total Bilirubin: 0.6 mg/dL (ref 0.3–1.2)
Total Protein: 5.3 g/dL — ABNORMAL LOW (ref 6.5–8.1)

## 2020-08-31 LAB — GLUCOSE, CAPILLARY
Glucose-Capillary: 290 mg/dL — ABNORMAL HIGH (ref 70–99)
Glucose-Capillary: 304 mg/dL — ABNORMAL HIGH (ref 70–99)
Glucose-Capillary: 269 mg/dL — ABNORMAL HIGH (ref 70–99)
Glucose-Capillary: 215 mg/dL — ABNORMAL HIGH (ref 70–99)
Glucose-Capillary: 147 mg/dL — ABNORMAL HIGH (ref 70–99)

## 2020-08-31 LAB — MAGNESIUM: Magnesium: 2.1 mg/dL (ref 1.7–2.4)

## 2020-08-31 LAB — PHOSPHORUS: Phosphorus: 2.8 mg/dL (ref 2.5–4.6)

## 2020-08-31 MED ORDER — FUROSEMIDE 10 MG/ML IJ SOLN
60.0000 mg | Freq: Once | INTRAMUSCULAR | Status: AC
  Administered 2020-08-31: 09:00:00 60 mg via INTRAVENOUS
  Filled 2020-08-31: qty 6

## 2020-08-31 MED ORDER — INSULIN GLARGINE 100 UNIT/ML ~~LOC~~ SOLN
15.0000 [IU] | Freq: Every day | SUBCUTANEOUS | Status: DC
  Administered 2020-08-31 – 2020-09-01 (×2): 15 [IU] via SUBCUTANEOUS
  Filled 2020-08-31 (×2): qty 0.15

## 2020-08-31 MED ORDER — INSULIN ASPART 100 UNIT/ML ~~LOC~~ SOLN
10.0000 [IU] | Freq: Three times a day (TID) | SUBCUTANEOUS | Status: DC
  Administered 2020-08-31 – 2020-09-01 (×5): 10 [IU] via SUBCUTANEOUS

## 2020-08-31 MED ORDER — SODIUM BICARBONATE 650 MG PO TABS
650.0000 mg | ORAL_TABLET | Freq: Three times a day (TID) | ORAL | Status: DC
  Administered 2020-08-31 – 2020-09-12 (×32): 650 mg via ORAL
  Filled 2020-08-31 (×32): qty 1

## 2020-08-31 NOTE — Progress Notes (Signed)
[facility] NEPHROLOGY PROGRESS NOTE  Assessment/ Plan: Pt is a 74 y.o. yo male  with history of hypertension, HLD, type II DM, CHF, A. fib, ESRD status post kidney transplant in 1983, 2005, CKD 4 with baseline creatinine level around 2..0-2.2, admitted with left foot infection, osteomyelitis, seen as a consultation for  evaluation of AKI on CKD.  #AKI on CKD stage IV in transplant kidney likely due to sepsis/hemodynamic changes related with hypertensive urgency on admission and then hypotension after NTG IV (SBP to 50s). Baseline creatinine level seems to be around 2.08-2.4 UA with proteinuria, CT scan showed unremarkable transplant kidney. Urine output around 1 L, noted creatinine level trending up today.  Hopefully the creatinine level will plateau in next few days.  Discontinue IV fluid, received a dose of Lasix today. Daily lab, strict ins and out Avoid hypotension, contrast or nephrotoxins.  #History of kidney transplant x2 , last in 2005: Continue Prograf 1 mg twice a day. Holding CellCept while treating for sepsis/infection.  Currently on higher dose of prednisone to treat gout flare. I recommend to resume home transplant medication on discharge. CT scan with unremarkable transplant kidney.  #Metabolic acidosis: Start sodium bicarbonate.  #Acute metabolic encephalopathy: CT scan with no acute finding however he does have advanced atherosclerosis. Minimize sedatives.  Mental status much improved today.  #Possible gout flare: Uric acid level elevated. On  Steroid.  #Hypertension: Required nitroglycerin drip on admission. Continue carvedilol. Diuretics on hold. Monitor blood pressure.  Subjective: Seen and examined at bedside.  He looks more alert awake and oriented today.  Denies nausea vomiting chest pain shortness of breath.  Urine output around 975 cc. Objective Vital signs in last 24 hours: Vitals:   08/31/20 0500 08/31/20 0600 08/31/20 0800 08/31/20 1036   BP:  103/60 (!) 116/45 (!) 100/44  Pulse:  71 72 69  Resp:  14 (!) 27 (!) 21  Temp:   98.6 F (37 C)   TempSrc:   Axillary   SpO2:  100% 100% 100%  Weight: 89.7 kg      Weight change: 1.9 kg  Intake/Output Summary (Last 24 hours) at 08/31/2020 1117 Last data filed at 08/31/2020 1000 Gross per 24 hour  Intake 1202.41 ml  Output 575 ml  Net 627.41 ml       Labs: Basic Metabolic Panel: Recent Labs  Lab 08/29/20 0243 08/30/20 0221 08/31/20 0244  NA 142 142 142  K 4.0 4.0 4.3  CL 112* 115* 113*  CO2 18* 17* 17*  GLUCOSE 205* 291* 319*  BUN 49* 51* 69*  CREATININE 2.27* 2.62* 3.03*  CALCIUM 9.1 8.3* 8.7*  PHOS 2.1* 2.8 2.8   Liver Function Tests: Recent Labs  Lab 08/29/20 0243 08/30/20 0221 08/31/20 0244  AST 24 20 22   ALT 10 9 9   ALKPHOS 80 67 65  BILITOT 0.6 0.8 0.6  PROT 5.6* 5.2* 5.3*  ALBUMIN 3.2* 2.9* 2.7*   No results for input(s): LIPASE, AMYLASE in the last 168 hours. Recent Labs  Lab 08/26/20 2203  AMMONIA 31   CBC: Recent Labs  Lab 08/27/20 1227 08/28/20 0244 08/29/20 0243 08/30/20 0221 08/31/20 0244  WBC 8.0 7.5 6.8 8.6 11.1*  NEUTROABS  --  6.0 4.8 6.9 10.0*  HGB 9.8* 8.4* 9.8* 8.9* 8.2*  HCT 33.3* 28.5* 33.1* 30.5* 28.3*  MCV 96.0 96.6 96.8 95.6 95.0  PLT 102* 91* 116* 79* 76*   Cardiac Enzymes: No results for input(s): CKTOTAL, CKMB, CKMBINDEX, TROPONINI in the last  168 hours. CBG: Recent Labs  Lab 08/30/20 1242 08/30/20 1633 08/30/20 2012 08/30/20 2139 08/31/20 0808  GLUCAP 261* 322* 290* 280* 304*    Iron Studies: No results for input(s): IRON, TIBC, TRANSFERRIN, FERRITIN in the last 72 hours. Studies/Results: DG CHEST PORT 1 VIEW  Result Date: 08/31/2020 CLINICAL DATA:  Pleural effusion EXAM: PORTABLE CHEST 1 VIEW COMPARISON:  08/26/2020 and prior. FINDINGS: Cardiomegaly and central pulmonary vascular congestion. Patchy bibasilar opacities, more conspicuous on the left. No pneumothorax. Likely small left pleural  effusion however the left costophrenic sulcus is partially imaged. IMPRESSION: Small left pleural effusion.  Increased bibasilar opacities. Cardiomegaly and central pulmonary vascular congestion. Electronically Signed   By: Primitivo Gauze M.D.   On: 08/31/2020 07:42   ECHOCARDIOGRAM COMPLETE  Result Date: 08/29/2020    ECHOCARDIOGRAM REPORT   Patient Name:   CHARLOTTE BRAFFORD Date of Exam: 08/29/2020 Medical Rec #:  [id number]       Height:       69.0 in Accession #:    [id number]      Weight:       190.0 lb Date of Birth:  December 01, 1945      BSA:          2.022 m Patient Age:    25 years        BP:           148/62 mmHg Patient Gender: M               HR:           75 bpm. Exam Location:  Inpatient Procedure: 2D Echo, Cardiac Doppler and Color Doppler Indications:    I50.23 Acute on chronic systolic (congestive) heart failure  History:        Patient has prior history of Echocardiogram examinations, most                 recent 05/26/2020. Arrythmias:Atrial Fibrillation; Risk                 Factors:Hypertension, Diabetes and Dyslipidemia. CKD.  Sonographer:    Jonelle Sidle Dance Referring Phys: [id number] [place]  1. Left ventricular ejection fraction, by estimation, is 40 to 45%. The left ventricle has mildly decreased function. The left ventricle demonstrates regional wall motion abnormalities (see scoring diagram/findings for description). There is moderate left ventricular hypertrophy. Left ventricular diastolic parameters are consistent with Grade III diastolic dysfunction (restrictive).  2. Right ventricular systolic function is normal. The right ventricular size is normal.  3. Left atrial size was moderately dilated.  4. Right atrial size was mildly dilated.  5. Moderate pleural effusion.  6. The mitral valve is normal in structure. No evidence of mitral valve regurgitation. No evidence of mitral stenosis. Moderate mitral annular calcification.  7. The aortic valve is normal in structure.  Aortic valve regurgitation is mild. No aortic stenosis is present.  8. Aortic dilatation noted. There is mild dilatation at the level of the sinuses of Valsalva, measuring 43 mm. There is moderate dilatation of the ascending aorta, measuring 45 mm.  9. The inferior vena cava is normal in size with greater than 50% respiratory variability, suggesting right atrial pressure of 3 mmHg. Comparison(s): No significant change from prior study. Prior images reviewed side by side. FINDINGS  Left Ventricle: Left ventricular ejection fraction, by estimation, is 40 to 45%. The left ventricle has mildly decreased function. The left ventricle demonstrates regional wall motion abnormalities. The left ventricular internal cavity size was  normal in size. There is moderate left ventricular hypertrophy. Left ventricular diastolic parameters are consistent with Grade III diastolic dysfunction (restrictive).  LV Wall Scoring: The inferior wall is akinetic. Right Ventricle: The right ventricular size is normal. No increase in right ventricular wall thickness. Right ventricular systolic function is normal. Left Atrium: Left atrial size was moderately dilated. Right Atrium: Right atrial size was mildly dilated. Pericardium: The pericardium was not assessed. Mitral Valve: The mitral valve is normal in structure. Moderate mitral annular calcification. No evidence of mitral valve regurgitation. No evidence of mitral valve stenosis. Tricuspid Valve: The tricuspid valve is normal in structure. Tricuspid valve regurgitation is not demonstrated. No evidence of tricuspid stenosis. Aortic Valve: The aortic valve is normal in structure. Aortic valve regurgitation is mild. Aortic regurgitation PHT measures 505 msec. No aortic stenosis is present. Pulmonic Valve: The pulmonic valve was normal in structure. Pulmonic valve regurgitation is not visualized. No evidence of pulmonic stenosis. Aorta: Aortic dilatation noted. There is mild dilatation at the  level of the sinuses of Valsalva, measuring 43 mm. There is moderate dilatation of the ascending aorta, measuring 45 mm. Venous: The inferior vena cava is normal in size with greater than 50% respiratory variability, suggesting right atrial pressure of 3 mmHg. IAS/Shunts: No atrial level shunt detected by color flow Doppler. Additional Comments: There is a moderate pleural effusion.  LEFT VENTRICLE PLAX 2D LVIDd:         4.60 cm  Diastology LVIDs:         3.50 cm  LV e' medial:    7.83 cm/s LV PW:         1.40 cm  LV E/e' medial:  18.8 LV IVS:        1.20 cm  LV e' lateral:   10.10 cm/s LVOT diam:     2.40 cm  LV E/e' lateral: 14.6 LV SV:         69 LV SV Index:   34 LVOT Area:     4.52 cm  RIGHT VENTRICLE            IVC RV Basal diam:  2.80 cm    IVC diam: 2.70 cm RV S prime:     7.72 cm/s TAPSE (M-mode): 1.4 cm LEFT ATRIUM              Index       RIGHT ATRIUM           Index LA diam:        4.00 cm  1.98 cm/m  RA Area:     21.50 cm LA Vol (A2C):   107.0 ml 52.93 ml/m RA Volume:   71.80 ml  35.52 ml/m LA Vol (A4C):   76.2 ml  37.69 ml/m LA Biplane Vol: 97.1 ml  48.03 ml/m  AORTIC VALVE LVOT Vmax:   94.80 cm/s LVOT Vmean:  60.400 cm/s LVOT VTI:    0.152 m AI PHT:      505 msec  AORTA Ao Root diam: 4.30 cm Ao Asc diam:  4.50 cm MITRAL VALVE MV Area (PHT): 5.27 cm     SHUNTS MV Decel Time: 144 msec     Systemic VTI:  0.15 m MV E velocity: 147.00 cm/s  Systemic Diam: 2.40 cm MV A velocity: 48.00 cm/s MV E/A ratio:  3.06 Candee Furbish MD Electronically signed by Candee Furbish MD Signature Date/Time: 08/29/2020/4:02:57 PM    Final    VAS [place] UPPER EXTREMITY VENOUS DUPLEX  Result Date:  08/31/2020 UPPER VENOUS STUDY  Indications: Pain, and Swelling Comparison Study: no prior Performing Technologist: Abram Sander RVS  Examination Guidelines: A complete evaluation includes B-mode imaging, spectral Doppler, color Doppler, and power Doppler as needed of all accessible portions of each vessel. Bilateral testing is  considered an integral part of a complete examination. Limited examinations for reoccurring indications may be performed as noted.  Left Findings: +----------+------------+---------+-----------+----------+-----------------+ LEFT      CompressiblePhasicitySpontaneousProperties     Summary      +----------+------------+---------+-----------+----------+-----------------+ IJV           Full       Yes       Yes                                +----------+------------+---------+-----------+----------+-----------------+ Subclavian    Full       Yes       Yes                                +----------+------------+---------+-----------+----------+-----------------+ Axillary      Full       Yes       Yes                                +----------+------------+---------+-----------+----------+-----------------+ Brachial      Full       Yes       Yes                                +----------+------------+---------+-----------+----------+-----------------+ Radial        Full                                                    +----------+------------+---------+-----------+----------+-----------------+ Ulnar         Full                                                    +----------+------------+---------+-----------+----------+-----------------+ Cephalic      None                                  Age Indeterminate +----------+------------+---------+-----------+----------+-----------------+ Basilic       Full                                                    +----------+------------+---------+-----------+----------+-----------------+  Summary:  Left: No evidence of deep vein thrombosis in the upper extremity. Findings consistent with age indeterminate superficial vein thrombosis involving the left cephalic vein.  *See table(s) above for measurements and observations.    Preliminary     Medications: Infusions: . sodium chloride    . cefTRIAXone (ROCEPHIN)  IV  Stopped (08/31/20 0008)  . lactated ringers 10 mL/hr at 08/31/20 1000  . nitroGLYCERIN  Stopped (08/29/20 1950)    Scheduled Medications: . (feeding supplement) PROSource Plus  30 mL Oral BID BM  . allopurinol  100 mg Oral Daily  . aspirin EC  162 mg Oral BID  . atorvastatin  10 mg Oral Daily  . calcitRIOL  0.25 mcg Oral Daily  . carvedilol  25 mg Oral BID WC  . Chlorhexidine Gluconate Cloth  6 each Topical Daily  . heparin  5,000 Units Subcutaneous Q8H  . hydrALAZINE  50 mg Oral TID  . insulin aspart  0-5 Units Subcutaneous QHS  . insulin aspart  0-9 Units Subcutaneous TID WC  . insulin aspart  10 Units Subcutaneous TID WC  . insulin glargine  15 Units Subcutaneous Daily  . latanoprost  1 drop Both Eyes QHS  . linagliptin  5 mg Oral Daily  . mouth rinse  15 mL Mouth Rinse BID  . multivitamin with minerals  1 tablet Oral Daily  . nutrition supplement (JUVEN)  1 packet Oral BID BM  . predniSONE  50 mg Oral Daily  . sodium chloride flush  3 mL Intravenous Q12H  . sodium chloride flush  3 mL Intravenous Q12H  . tacrolimus  1 mg Oral BID    have reviewed scheduled and prn medications.  Physical Exam: General:NAD, comfortable Heart:RRR, s1s2 nl Lungs:clear b/l, no crackle Abdomen:soft, Non-tender, non-distended Extremities: Trace LE edema Neurology: Alert awake and following commands  Shaheen Mende Tanna Furry 08/31/2020,11:17 AM  LOS: 5 days  Pager: [phone]

## 2020-08-31 NOTE — Progress Notes (Signed)
PROGRESS NOTE    [name]  HER:[id number] DOB: [birth year] DOA: 08/26/2020 PCP: [name], MD     Brief Narrative:   [name] is a 74 y.o. 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  Patient had reportedly been complaining of some left foot pain and nausea last night but otherwise seem to be in his usual state when his wife left the house today but she was later called by a home health worker who reported that the patient was confused.  He was able to open the door for the home health agent but unable to answer any of her basic questions.  Patient is unable to contribute to the history.   ED Course: Upon arrival to the ED, patient is found to be febrile to 39.3 C, saturating 98% on 2 L/min of supplemental oxygen, and hypertensive with systolic pressure as high as 237.  EKG features sinus tachycardia with rate 105.  Noncontrast head CT negative for acute intracranial abnormality.  Chest x-ray notable for cardiomegaly with mild diffuse interstitial edema.  Radiographs of the left foot demonstrate soft tissue swelling about the left fifth MTP without acute underlying bony abnormality.  No acute findings on CT of the abdomen and pelvis.  Chemistry panel features a glucose of 232, bicarbonate 17, and creatinine 1.85.  CBC notable for mild leukocytosis and thrombocytopenia, and a improved normocytic anemia.  Lactic acid is 1.9.  BNP 532.  Blood and urine cultures were collected in the emergency department patient was given 500 cc of saline, 40 mg IV Lasix, vancomycin, cefepime, and Flagyl.  He was started on nitroglycerin infusion for blood pressure.  COVID-19 pcr is negative.   Subjective: 12/12 afebrile overnight more awake today and interactive.  A/O x4.  Follows all commands asks  appropriate questions.    Assessment & Plan: Covid vaccination; vaccinated   Principal Problem:   Sepsis due to cellulitis [facility]) Active Problems:   Kidney transplant status, cadaveric   Essential hypertension   Acute encephalopathy   Anemia due to chronic kidney disease   Diabetes mellitus type 2, insulin dependent (HCC)   Chronic kidney disease, stage 3b (HCC)   Hypertensive emergency   Anemia, unspecified   Chronic renal failure, stage 3b ([place])   Renal transplant, status post   Diabetes mellitus type 2, controlled, with complications ([place])   Diabetic nephropathy ([place])   Pressure injury of skin   Malnutrition of moderate degree   Idiopathic chronic gout of multiple sites without tophus   Systolic and diastolic CHF, acute (HCC)  Sepsis/osteomyelitis LEFT fifth metatarsal -Upon admission patient met criteria for sepsis temp > 38 C, RR> 20, point of infection cellulitis bilateral lower extremity and possible osteomyelitis of LEFT fifth metatarsal  -Continue current antibiotic treatment -Blood cultures pending -MRI LEFT foot; poor MRI secondary to motion artifact however infection, and erosions of metatarsals bone see report below. -Lactic acid/procalcitonin pending we will used to determine narrowing of antibiotic therapy. -12/9 D5-0.9% saline 145m/hr -12/10 ABI bilateral lower extremity pending -12/10 after ABI obtained will contact Dr. DSharol Givenand discuss findings, is this because of sepsis and is surgery warranted? -12/11 discussed case with Dr. DSharol Givenorthopedic surgery who agreed to see patient..  ADDENDUM; received call from Dr. DSharol Given felt that all of patient's lower extremity pain secondary to his  gout and no surgical intervention warranted.   Acute encephalopathy  -Head CT negative and no focal neuro deficits identified  -Not convinced patient encephalopathic.  Patient appears to be hard of hearing as well as a little bit slow however if you speak loudly and clearly and  give him a few minutes to think he answers appropriately. -Tacrolimus level pending -RPR nonreactive -Ammonia WNL -TSH WNL -B12/WNL  -All sedating medication except for gabapentin. -12/12 patient's cognition significantly proved.  Renal transplant/CKD stage IIIb (baseline Cr ~1.85) -Renally dose all medications -Hold all renal toxic medication -12/11 DC CellCept 500 mg BID per nephrology recommendation -12/10 Increase Prednisone 50 mg daily for possible Gout Flare. (Normal dose 5 mg daily)  -Tacrolimus 1 mg BID Lab Results  Component Value Date   CREATININE 3.03 (H) 08/31/2020   CREATININE 2.62 (H) 08/30/2020   CREATININE 2.27 (H) 08/29/2020   CREATININE 2.37 (H) 08/28/2020   CREATININE 1.92 (H) 08/27/2020   CREATININE 2.09 (H) 08/27/2020  -12/11 DC D5-0.9% saline 121m/hr -12/11 change to LR 550mhr per nephrology recommendation; believe patient fluid overloaded (held)  Anemia (baseline HgB 10.9) -Most likely secondary to CKD, anemia of chronic disease? -Anemia panel pending -Haptoglobin/LDH pending -Fecal occult blood pending  Hypertensive emergency -BP> 180 with chest pain, possible ischemia -Continue nitroglycerin drip -12/9 increase Coreg 25 mg BID  -12/9 Lasix 80 mg daily (hold) renal functioning deteriorating -12/9 increase Hydralazine 50 Mg TID -Strict ins and outs +6.8 L -Daily weight Filed Weights   08/29/20 0500 08/30/20 0500 08/31/20 0500  Weight: 86.2 kg 87.8 kg 89.7 kg  -12/12 Lasix IV 60 mg x 1.  Believe patient fluid overloaded  Acute Systolic and Diastolic CHF -Patient asked uncontrolled HTN DC Solu-Cortef -September 2021 EF 55 to 60% -See HTN emergency -12/10 echocardiogram LVEF= 40 to 4582%with diastolic dysfunction see results below -12/12 with worsening cardiac function will consult cardiology.  Appears last saw Dr. HeDaneen Schick/17/2020 -12/12 spoke with Dr. KaOttie Glazierardiology agreed to see patient will await further  recommendations  Chest pain -See hypertensive emergency  Hypotension --12/10 Albumin 25g  X 1   Pleural effusion -Moderate pleural effusion seen on echocardiogram. -12/12 PCXR; small effusion and pulmonary congestion see results below  DM type II controlled with complication/DM nephropathy -12/8 hemoglobin A1c= 6.6 -12/12 increase Lantus 15 units daily -12/12 increase NovoLog 10 units  -Sensitive SSI -Tradjenta 5 mg daily  Gout -12/9 Uric acid = 10.7  -When patient's renal function begins to improve we will start colchicine -Other than his LEFT FIFTH METATARSAL NO JOINT PAIN. -Patient on prednisone for his renal transplant; may be a gout flare,See Renal Transplant .  Hypomagnesmia -Magnesium goal>  Bilateral arm pain chronic -Although chronic since patient not able to adequately describe if it is only in the joints will obtain upper extremity Doppler ultrasound R/O DVT   DVT prophylaxis: Heparin subcu Code Status: Full Family Communication:  Status is: Inpatient    Dispo: The patient is from: Home              Anticipated d/c is to: Home              Anticipated d/c date is: 12/12              Patient currently unstable      Consultants:  Dr. KaOttie Glazierardiology Dr. DuSharol Givenrthopedic surgery Nephrology    Procedures/Significant Events:  12/7 PCXR;Cardiomegaly and mild, diffuse interstitial pulmonary opacity, likely edema. No focal  airspace opacity 12/7 CT abdomen pelvis W. Wo contrast;-Small bibasilar pleural effusions and compressive atelectasis in the RIGHT lower lobe. -Atrophic native kidneys with old calcified renal transplant in RIGHT iliac fossa. -Transplant kidney LEFT iliac fossa without mass or hydronephrosis. -Scattered colonic diverticulosis without evidence of diverticulitis. -No definite acute intra-abdominal or intrapelvic abnormalities. 12/10 MRI LEFT foot W0 contrast;-prominent erosions and irregularity along the articular  surfaces of the first MTP joint and in the distal half of the proximal phalanx of the great toe, and in the base of the distal phalanx fourth toe. The phalanges of the fifth toe are somewhat obscured by motion artifact, but I not see clear evidence of osteomyelitis. 2. Small erosion of the base of the distal phalanx fourth toe. 3. Circumferential edema in the forefoot, especially dorsally, cellulitis is not excluded. No drainable fluid collection is identified. 4. Generalized muscular atrophy in the forefoot. Low-grade edema is likely neurogenic. 5. Despite efforts by the technologist and patient, severe motion artifact is present on today's exam and could not be eliminated. This reduces exam sensitivity and specificity. The patient terminated the exam prior to the acquisition of sagittal images and would not allow repeat series to correct for the severely motion degraded images.  12/11 bilateral lower extremity ABIs;Right: Resting right ankle-brachial index indicates noncompressible right  lower extremity arteries.   Absent TBI.  Left: Resting left ankle-brachial index indicates noncompressible left  lower extremity arteries.  12/10 Echocardiogram;LVEF=40 to 45%. Left ventricle demonstrates regional wall motion abnormalities.  -Moderate LVH -Grade III diastolic dysfunction   -The inferior wall is akinetic.  Left Atrium: moderately dilated.   Aorta: moderate dilatation of the ascending aorta, measuring 45 mm.  Additional Comments: There is a moderate pleural effusion.  12/11 PCXR;-small left pleural effusion. Increased bibasilar opacities. -Cardiomegaly and central pulmonary vascular congestion.    I have personally reviewed and interpreted all radiology studies and my findings are as above.  VENTILATOR SETTINGS:    Cultures 12/7 blood LEFT hand NGTD  12/7 RIGHT arm NGTD  12/7 urine insignificant growth 12/8 MRSA by PCR negative    Antimicrobials: Anti-infectives  (From admission, onward)   Start     Ordered Stop   08/26/20 2359  cefTRIAXone (ROCEPHIN) 1 g in sodium chloride 0.9 % 100 mL IVPB        08/26/20 2028     08/26/20 1545  ceFEPIme (MAXIPIME) 2 g in sodium chloride 0.9 % 100 mL IVPB        08/26/20 1532 08/26/20 1631   08/26/20 1545  metroNIDAZOLE (FLAGYL) tablet 500 mg  Status:  Discontinued        08/26/20 1532 08/26/20 2015   08/26/20 1545  vancomycin (VANCOCIN) IVPB 1000 mg/200 mL premix        08/26/20 1532 08/26/20 1823       Devices    LINES / TUBES:      Continuous Infusions: . sodium chloride    . cefTRIAXone (ROCEPHIN)  IV Stopped (08/31/20 0008)  . lactated ringers 50 mL/hr at 08/31/20 0600  . nitroGLYCERIN Stopped (08/29/20 0846)     Objective: Vitals:   08/31/20 0400 08/31/20 0416 08/31/20 0500 08/31/20 0600  BP: (!) 109/38 (!) 108/37  103/60  Pulse: 71 71  71  Resp: 16 18  14   Temp: 97.8 F (36.6 C)     TempSrc: Axillary     SpO2: 100% 100%  100%  Weight:   89.7 kg     Intake/Output Summary (Last 24  hours) at 08/31/2020 0800 Last data filed at 08/31/2020 0600 Gross per 24 hour  Intake 1388.61 ml  Output 975 ml  Net 413.61 ml   Filed Weights   08/29/20 0500 08/30/20 0500 08/31/20 0500  Weight: 86.2 kg 87.8 kg 89.7 kg    Physical Exam:  General: A/O x4, No acute respiratory distress Eyes: negative scleral hemorrhage, negative anisocoria, negative icterus ENT: Negative Runny nose, negative gingival bleeding, Neck:  Negative scars, masses, torticollis, lymphadenopathy, JVD Lungs: Clear to auscultation bilaterally without wheezes or crackles Cardiovascular: Regular rate and rhythm without murmur gallop or rub normal S1 and S2 Abdomen: negative abdominal pain, nondistended, positive soft, bowel sounds, no rebound, no ascites, no appreciable mass Extremities: continued pain to palpation in bilateral upper and lower extremities however significantly decreased from 12/11.  Pain worse in L LE ankle  and foot Skin: Multiple bilateral lower extremity laceration/lesions.  Erythema decreased, pain to palpation decreased Psychiatric:  Negative depression, negative anxiety, negative fatigue, negative mania  Central nervous system:  Cranial nerves II through XII intact, tongue/uvula midline, all extremities muscle strength 5/5, sensation intact throughout, negative dysarthria, negative expressive aphasia, negative receptive aphasia. .     Data Reviewed: Care during the described time interval was provided by me .  I have reviewed this patient's available data, including medical history, events of note, physical examination, and all test results as part of my evaluation.  CBC: Recent Labs  Lab 08/26/20 1530 08/27/20 1227 08/28/20 0244 08/29/20 0243 08/30/20 0221 08/31/20 0244  WBC 10.7* 8.0 7.5 6.8 8.6 11.1*  NEUTROABS 8.5*  --  6.0 4.8 6.9 10.0*  HGB 10.9* 9.8* 8.4* 9.8* 8.9* 8.2*  HCT 37.6* 33.3* 28.5* 33.1* 30.5* 28.3*  MCV 96.4 96.0 96.6 96.8 95.6 95.0  PLT 120* 102* 91* 116* 79* 76*   Basic Metabolic Panel: Recent Labs  Lab 08/27/20 1227 08/28/20 0244 08/29/20 0243 08/30/20 0221 08/31/20 0244  NA 145  146* 142 142 142 142  K 4.1  4.1 3.8 4.0 4.0 4.3  CL 116*  115* 112* 112* 115* 113*  CO2 16*  18* 19* 18* 17* 17*  GLUCOSE 241*  243* 147* 205* 291* 319*  BUN 37*  37* 42* 49* 51* 69*  CREATININE 2.09*  1.92* 2.37* 2.27* 2.62* 3.03*  CALCIUM 9.6  9.5 9.1 9.1 8.3* 8.7*  MG 1.7 1.6* 1.7 2.0 2.1  PHOS 2.6 2.3* 2.1* 2.8 2.8   GFR: Estimated Creatinine Clearance: 24 mL/min (A) (by C-G formula based on SCr of 3.03 mg/dL (H)). Liver Function Tests: Recent Labs  Lab 08/27/20 1227 08/28/20 0244 08/29/20 0243 08/30/20 0221 08/31/20 0244  AST 25 23 24 20 22   ALT 10 9 10 9 9   ALKPHOS 83 61 80 67 65  BILITOT 1.1 1.0 0.6 0.8 0.6  PROT 5.9* 5.0* 5.6* 5.2* 5.3*  ALBUMIN 3.4* 2.9* 3.2* 2.9* 2.7*   No results for input(s): LIPASE, AMYLASE in the last 168  hours. Recent Labs  Lab 08/26/20 2203  AMMONIA 31   Coagulation Profile: Recent Labs  Lab 08/26/20 1530 08/27/20 1654  INR 1.3* 1.3*   Cardiac Enzymes: No results for input(s): CKTOTAL, CKMB, CKMBINDEX, TROPONINI in the last 168 hours. BNP (last 3 results) No results for input(s): PROBNP in the last 8760 hours. HbA1C: No results for input(s): HGBA1C in the last 72 hours. CBG: Recent Labs  Lab 08/30/20 0741 08/30/20 1242 08/30/20 1633 08/30/20 2012 08/30/20 2139  GLUCAP 239* 261* 322* 290* 280*   Lipid Profile:  No results for input(s): CHOL, HDL, LDLCALC, TRIG, CHOLHDL, LDLDIRECT in the last 72 hours. Thyroid Function Tests: No results for input(s): TSH, T4TOTAL, FREET4, T3FREE, THYROIDAB in the last 72 hours. Anemia Panel: No results for input(s): VITAMINB12, FOLATE, FERRITIN, TIBC, IRON, RETICCTPCT in the last 72 hours. Sepsis Labs: Recent Labs  Lab 08/27/20 1616 08/27/20 1648 08/27/20 1940 08/28/20 0244 08/28/20 0944 08/28/20 1241 08/29/20 0243  PROCALCITON 2.60  --   --  2.88  --   --  2.17  LATICACIDVEN  --  1.7 2.1*  --  0.8 1.2  --     Recent Results (from the past 240 hour(s))  Blood Culture (routine x 2)     Status: None (Preliminary result)   Collection Time: 08/26/20  3:30 PM   Specimen: BLOOD LEFT HAND  Result Value Ref Range Status   Specimen Description   Final    BLOOD LEFT HAND Performed at [facility], [place] [address], [place], [place] [address]    Special Requests   Final    BOTTLES DRAWN AEROBIC AND ANAEROBIC Blood Culture adequate volume Performed at [facility] [address], [place], [place] [address]    Culture   Final    NO GROWTH 4 DAYS Performed at [facility], [place] [address], [place], [place] [address]    Report Status PENDING  Incomplete  Blood Culture (routine x 2)     Status: None (Preliminary result)   Collection Time: 08/26/20  3:30 PM   Specimen: BLOOD  Result Value  Ref Range Status   Specimen Description   Final    BLOOD RIGHT ARM Performed at [facility] [address], [place], [place] [address]    Special Requests   Final    BOTTLES DRAWN AEROBIC AND ANAEROBIC Blood Culture results may not be optimal due to an inadequate volume of blood received in culture bottles Performed at [facility] [address], [place], [place] [address]    Culture   Final    NO GROWTH 4 DAYS Performed at [facility], [place] [address] [place], [place] [address]    Report Status PENDING  Incomplete  Urine culture     Status: Abnormal   Collection Time: 08/26/20  3:33 PM   Specimen: In/Out Cath Urine  Result Value Ref Range Status   Specimen Description   Final    IN/OUT CATH URINE Performed at [facility] [address] [place], [place] [address]    Special Requests   Final    NONE Performed at [facility], [place] [address], [place], [place] [address]    Culture (A)  Final    <10,000 COLONIES/mL INSIGNIFICANT GROWTH Performed at [facility] [address] [place], [place] [address]    Report Status 08/28/2020 FINAL  Final  Resp Panel by RT-PCR (Flu A&B, Covid) In/Out Cath Urine     Status: None   Collection Time: 08/26/20  4:30 PM   Specimen: In/Out Cath Urine; Nasopharyngeal(NP) swabs in vial transport medium  Result Value Ref Range Status   SARS Coronavirus 2 by RT PCR NEGATIVE NEGATIVE Final    Comment: (NOTE) SARS-CoV-2 target nucleic acids are NOT DETECTED.  The SARS-CoV-2 RNA is generally detectable in upper respiratory specimens during the acute phase of infection. The lowest concentration of SARS-CoV-2 viral copies this assay can detect is 138 copies/mL. A negative result does not preclude SARS-Cov-2  infection and should not be used as the sole basis for treatment or other patient management decisions. A negative result may occur with  improper  specimen collection/handling, submission of specimen other than nasopharyngeal swab, presence of viral mutation(s) within the areas targeted by this assay, and inadequate number of viral copies(<138 copies/mL). A negative result must be combined with clinical observations, patient history, and epidemiological information. The expected result is Negative.  Fact Sheet for Patients:  EntrepreneurPulse.com.au  Fact Sheet for Healthcare Providers:  IncredibleEmployment.be  This test is no t yet approved or cleared by the [place] FDA and  has been authorized for detection and/or diagnosis of SARS-CoV-2 by FDA under an Emergency Use Authorization (EUA). This EUA will remain  in effect (meaning this test can be used) for the duration of the COVID-19 declaration under Section 564(b)(1) of the Act, 21 U.S.C.section 360bbb-3(b)(1), unless the authorization is terminated  or revoked sooner.       Influenza A by PCR NEGATIVE NEGATIVE Final   Influenza B by PCR NEGATIVE NEGATIVE Final    Comment: (NOTE) The Xpert Xpress SARS-CoV-2/FLU/RSV plus assay is intended as an aid in the diagnosis of influenza from Nasopharyngeal swab specimens and should not be used as a sole basis for treatment. Nasal washings and aspirates are unacceptable for Xpert Xpress SARS-CoV-2/FLU/RSV testing.  Fact Sheet for Patients: EntrepreneurPulse.com.au  Fact Sheet for Healthcare Providers: IncredibleEmployment.be  This test is not yet approved or cleared by the [place] FDA and has been authorized for detection and/or diagnosis of SARS-CoV-2 by FDA under an Emergency Use Authorization (EUA). This EUA will remain in effect (meaning this test can be used) for the duration of the COVID-19 declaration under Section 564(b)(1) of the Act, 21 U.S.C. section 360bbb-3(b)(1), unless the authorization is terminated or revoked.  Performed at  [facility], [place] [address], [place], [place] [address]   MRSA PCR Screening     Status: None   Collection Time: 08/27/20 12:22 PM   Specimen: Nasal Mucosa; Nasopharyngeal  Result Value Ref Range Status   MRSA by PCR NEGATIVE NEGATIVE Final    Comment:        The GeneXpert MRSA Assay (FDA approved for NASAL specimens only), is one component of a comprehensive MRSA colonization surveillance program. It is not intended to diagnose MRSA infection nor to guide or monitor treatment for MRSA infections. Performed at [facility], [place] [address], [place], [place] [address]          Radiology Studies: DG CHEST PORT 1 VIEW  Result Date: 08/31/2020 CLINICAL DATA:  Pleural effusion EXAM: PORTABLE CHEST 1 VIEW COMPARISON:  08/26/2020 and prior. FINDINGS: Cardiomegaly and central pulmonary vascular congestion. Patchy bibasilar opacities, more conspicuous on the left. No pneumothorax. Likely small left pleural effusion however the left costophrenic sulcus is partially imaged. IMPRESSION: Small left pleural effusion.  Increased bibasilar opacities. Cardiomegaly and central pulmonary vascular congestion. Electronically Signed   By: Primitivo Gauze M.D.   On: 08/31/2020 07:42   VAS [place] ABI WITH/WO TBI  Result Date: 08/29/2020 LOWER EXTREMITY DOPPLER STUDY Indications: Ulceration- chronic LT foot wound. High Risk Factors: Hypertension, hyperlipidemia, Diabetes.  Comparison Study: 10-12-2019 Abdominal aortogram w/ lower extremity study                   08-09-2019 ABI w/ TBI Performing Technologist: Darlin Coco RDMS  Examination Guidelines: A complete evaluation includes at minimum, Doppler waveform signals and systolic blood pressure reading at the  level of bilateral brachial, anterior tibial, and posterior tibial arteries, when vessel segments are accessible. Bilateral testing is considered an integral part of a complete examination. Photoelectric  Plethysmograph (PPG) waveforms and toe systolic pressure readings are included as required and additional duplex testing as needed. Limited examinations for reoccurring indications may be performed as noted.  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                  +---------+------------------+-----+----------+--------------------------------+ PTA      255               2.26 monophasic                                 +---------+------------------+-----+----------+--------------------------------+ DP       255               2.26 monophasic                                 +---------+------------------+-----+----------+--------------------------------+ Great Toe                       Absent                                     +---------+------------------+-----+----------+--------------------------------+ +---------+------------------+-----+----------+-------+ Left     Lt Pressure (mmHg)IndexWaveform  Comment +---------+------------------+-----+----------+-------+ Brachial 113                    triphasic         +---------+------------------+-----+----------+-------+ PTA      255               2.26 monophasic        +---------+------------------+-----+----------+-------+ DP       255               2.26 monophasic        +---------+------------------+-----+----------+-------+ Great Toe                       Absent            +---------+------------------+-----+----------+-------+ +-------+-----------+-----------+------------+------------+ ABI/TBIToday's ABIToday's TBIPrevious ABIPrevious TBI  +-------+-----------+-----------+------------+------------+ Right  [place]         Absent     [place]          0.40         +-------+-----------+-----------+------------+------------+ Left   [place]         Absent     [place]          0.24         +-------+-----------+-----------+------------+------------+ Arterial wall calcification precludes accurate ankle pressures and ABIs. Right ABIs  appear essentially unchanged compared to prior study on 08-09-2019. Left ABIs appear essentially unchanged compared to prior study on 08-09-2019.  Summary: Right: Resting right ankle-brachial index indicates noncompressible right lower extremity arteries. Absent TBI. Left: Resting left ankle-brachial index indicates noncompressible left lower extremity arteries. Absent TBI.  *See table(s) above for measurements and observations.     Preliminary    ECHOCARDIOGRAM COMPLETE  Result Date: 08/29/2020    ECHOCARDIOGRAM REPORT   Patient Name:   [name] Date of Exam: 08/29/2020 Medical Rec #:  [id number]       Height:       69.0 in Accession #:    [id number]      Weight:       190.0 lb Date of Birth:  [birth year]      BSA:          2.022 m Patient Age:    54 years        BP:           148/62 mmHg Patient Gender: M               HR:           75 bpm. Exam Location:  Inpatient Procedure: 2D Echo, Cardiac Doppler and Color Doppler Indications:    I50.23 Acute on chronic systolic (congestive) heart failure  History:        Patient has prior history of Echocardiogram examinations, most                 recent 05/26/2020. Arrythmias:Atrial Fibrillation; Risk                 Factors:Hypertension, Diabetes and Dyslipidemia. CKD.  Sonographer:    Jonelle Sidle Dance Referring Phys: [id number] [place]  1. Left ventricular ejection fraction, by estimation, is 40 to 45%. The left ventricle has mildly decreased function. The left ventricle demonstrates regional wall motion abnormalities (see scoring diagram/findings for description). There  is moderate left ventricular hypertrophy. Left ventricular diastolic parameters are consistent with Grade III diastolic dysfunction (restrictive).  2. Right ventricular systolic function is normal. The right ventricular size is normal.  3. Left atrial size was moderately dilated.  4. Right atrial size was mildly dilated.  5. Moderate pleural effusion.  6. The mitral valve is normal in structure. No evidence of mitral valve regurgitation. No evidence of mitral stenosis. Moderate mitral annular calcification.  7. The aortic valve is normal in structure. Aortic valve regurgitation is mild. No aortic stenosis is present.  8. Aortic dilatation noted. There is mild dilatation at the level of the sinuses of Valsalva, measuring 43 mm. There is moderate dilatation of the ascending aorta, measuring 45 mm.  9. The inferior vena cava is normal in size with greater than 50% respiratory variability, suggesting right atrial pressure of 3 mmHg. Comparison(s): No significant change from prior study. Prior images reviewed side by side. FINDINGS  Left Ventricle: Left ventricular ejection fraction, by estimation, is 40 to 45%. The left ventricle has mildly decreased function. The left ventricle demonstrates regional wall motion abnormalities. The left ventricular internal cavity size was normal in size. There is moderate left ventricular hypertrophy. Left ventricular diastolic parameters are consistent with Grade III diastolic dysfunction (restrictive).  LV Wall Scoring: The inferior wall is akinetic. Right Ventricle: The right ventricular size is normal. No increase in right ventricular wall thickness. Right ventricular systolic function is normal. Left Atrium: Left atrial size was moderately dilated. Right Atrium: Right atrial size was  mildly dilated. Pericardium: The pericardium was not assessed. Mitral Valve: The mitral valve is normal in structure. Moderate mitral annular calcification. No evidence of mitral valve regurgitation.  No evidence of mitral valve stenosis. Tricuspid Valve: The tricuspid valve is normal in structure. Tricuspid valve regurgitation is not demonstrated. No evidence of tricuspid stenosis. Aortic Valve: The aortic valve is normal in structure. Aortic valve regurgitation is mild. Aortic regurgitation PHT measures 505 msec. No aortic stenosis is present. Pulmonic Valve: The pulmonic valve was normal in structure. Pulmonic valve regurgitation is not visualized. No evidence of pulmonic stenosis. Aorta: Aortic dilatation noted. There is mild dilatation at the level of the sinuses of Valsalva, measuring 43 mm. There is moderate dilatation of the ascending aorta, measuring 45 mm. Venous: The inferior vena cava is normal in size with greater than 50% respiratory variability, suggesting right atrial pressure of 3 mmHg. IAS/Shunts: No atrial level shunt detected by color flow Doppler. Additional Comments: There is a moderate pleural effusion.  LEFT VENTRICLE PLAX 2D LVIDd:         4.60 cm  Diastology LVIDs:         3.50 cm  LV e' medial:    7.83 cm/s LV PW:         1.40 cm  LV E/e' medial:  18.8 LV IVS:        1.20 cm  LV e' lateral:   10.10 cm/s LVOT diam:     2.40 cm  LV E/e' lateral: 14.6 LV SV:         69 LV SV Index:   34 LVOT Area:     4.52 cm  RIGHT VENTRICLE            IVC RV Basal diam:  2.80 cm    IVC diam: 2.70 cm RV S prime:     7.72 cm/s TAPSE (M-mode): 1.4 cm LEFT ATRIUM              Index       RIGHT ATRIUM           Index LA diam:        4.00 cm  1.98 cm/m  RA Area:     21.50 cm LA Vol (A2C):   107.0 ml 52.93 ml/m RA Volume:   71.80 ml  35.52 ml/m LA Vol (A4C):   76.2 ml  37.69 ml/m LA Biplane Vol: 97.1 ml  48.03 ml/m  AORTIC VALVE LVOT Vmax:   94.80 cm/s LVOT Vmean:  60.400 cm/s LVOT VTI:    0.152 m AI PHT:      505 msec  AORTA Ao Root diam: 4.30 cm Ao Asc diam:  4.50 cm MITRAL VALVE MV Area (PHT): 5.27 cm     SHUNTS MV Decel Time: 144 msec     Systemic VTI:  0.15 m MV E velocity: 147.00 cm/s  Systemic  Diam: 2.40 cm MV A velocity: 48.00 cm/s MV E/A ratio:  3.06 Candee Furbish MD Electronically signed by Candee Furbish MD Signature Date/Time: 08/29/2020/4:02:57 PM    Final         Scheduled Meds: . (feeding supplement) PROSource Plus  30 mL Oral BID BM  . allopurinol  100 mg Oral Daily  . aspirin EC  162 mg Oral BID  . atorvastatin  10 mg Oral Daily  . calcitRIOL  0.25 mcg Oral Daily  . carvedilol  25 mg Oral BID WC  . Chlorhexidine Gluconate Cloth  6 each Topical Daily  . heparin  5,000 Units Subcutaneous Q8H  . hydrALAZINE  50 mg Oral TID  . insulin aspart  0-5 Units Subcutaneous QHS  . insulin aspart  0-9 Units Subcutaneous TID WC  . insulin aspart  3 Units Subcutaneous TID WC  . insulin glargine  10 Units Subcutaneous Daily  . latanoprost  1 drop Both Eyes QHS  . linagliptin  5 mg Oral Daily  . mouth rinse  15 mL Mouth Rinse BID  . multivitamin with minerals  1 tablet Oral Daily  . nutrition supplement (JUVEN)  1 packet Oral BID BM  . predniSONE  50 mg Oral Daily  . sodium chloride flush  3 mL Intravenous Q12H  . sodium chloride flush  3 mL Intravenous Q12H  . tacrolimus  1 mg Oral BID   Continuous Infusions: . sodium chloride    . cefTRIAXone (ROCEPHIN)  IV Stopped (08/31/20 0008)  . lactated ringers 50 mL/hr at 08/31/20 0600  . nitroGLYCERIN Stopped (08/29/20 0846)     LOS: 5 days    Time spent:40 min    Maddex Garlitz, Geraldo Docker, MD [facility] Pager [phone]  If 7PM-7AM, please contact night-coverage [email or web address] Password [email or web address] 08/31/2020, 8:00 AM

## 2020-08-31 NOTE — Progress Notes (Signed)
Left upper extremity venous has been completed.   Patient refused [place] on right arm.  Preliminary results in CV Proc.   [name] 08/31/2020 8:54 AM

## 2020-08-31 NOTE — Plan of Care (Signed)
  Problem: Clinical Measurements: Goal: Ability to maintain clinical measurements within normal limits will improve Outcome: Progressing   Problem: Pain Managment: Goal: General experience of comfort will improve Outcome: Progressing   Problem: Safety: Goal: Ability to remain free from injury will improve Outcome: Progressing   

## 2020-08-31 NOTE — Progress Notes (Signed)
Paged Dr. Clydene Laming about pt's HR dropping into the 50's while pt is sitting up in bed. Pt c/o no pain or discomfort when assessed. Dr. Clydene Laming advised RN to continue keeping a watch on cognitive function for any changes and to continue watching HR. Will continue to monitor

## 2020-09-01 ENCOUNTER — Encounter (HOSPITAL_COMMUNITY): Payer: Self-pay | Admitting: Family Medicine

## 2020-09-01 DIAGNOSIS — I161 Hypertensive emergency: Secondary | ICD-10-CM

## 2020-09-01 DIAGNOSIS — N1832 Chronic kidney disease, stage 3b: Secondary | ICD-10-CM

## 2020-09-01 DIAGNOSIS — I5031 Acute diastolic (congestive) heart failure: Secondary | ICD-10-CM

## 2020-09-01 DIAGNOSIS — G9340 Encephalopathy, unspecified: Secondary | ICD-10-CM

## 2020-09-01 LAB — RENAL FUNCTION PANEL
Albumin: 2.6 g/dL — ABNORMAL LOW (ref 3.5–5.0)
Anion gap: 10 (ref 5–15)
BUN: 92 mg/dL — ABNORMAL HIGH (ref 8–23)
CO2: 18 mmol/L — ABNORMAL LOW (ref 22–32)
Calcium: 8.5 mg/dL — ABNORMAL LOW (ref 8.9–10.3)
Chloride: 107 mmol/L (ref 98–111)
Creatinine, Ser: 3.27 mg/dL — ABNORMAL HIGH (ref 0.61–1.24)
GFR, Estimated: 19 mL/min — ABNORMAL LOW (ref >60)
Glucose, Bld: 185 mg/dL — ABNORMAL HIGH (ref 70–99)
Phosphorus: 3.4 mg/dL (ref 2.5–4.6)
Potassium: 4.6 mmol/L (ref 3.5–5.1)
Sodium: 135 mmol/L (ref 135–145)

## 2020-09-01 LAB — CBC WITH DIFFERENTIAL/PLATELET
Abs Immature Granulocytes: 0 10*3/uL (ref 0.00–0.07)
Band Neutrophils: 0 %
Basophils Absolute: 0 10*3/uL (ref 0.0–0.1)
Basophils Relative: 0 %
Blasts: 0 %
Eosinophils Absolute: 0 10*3/uL (ref 0.0–0.5)
Eosinophils Relative: 0 %
HCT: 27 % — ABNORMAL LOW (ref 39.0–52.0)
Hemoglobin: 7.8 g/dL — ABNORMAL LOW (ref 13.0–17.0)
Lymphocytes Relative: 4 %
Lymphs Abs: 0.4 10*3/uL — ABNORMAL LOW (ref 0.7–4.0)
MCH: 27.9 pg (ref 26.0–34.0)
MCHC: 28.9 g/dL — ABNORMAL LOW (ref 30.0–36.0)
MCV: 96.4 fL (ref 80.0–100.0)
Metamyelocytes Relative: 0 %
Monocytes Absolute: 0.2 10*3/uL (ref 0.1–1.0)
Monocytes Relative: 2 %
Myelocytes: 0 %
Neutro Abs: 9.4 10*3/uL — ABNORMAL HIGH (ref 1.7–7.7)
Neutrophils Relative %: 94 %
Other: 0 %
Platelets: 87 10*3/uL — ABNORMAL LOW (ref 150–400)
Promyelocytes Relative: 0 %
RBC: 2.8 MIL/uL — ABNORMAL LOW (ref 4.22–5.81)
RDW: 14.5 % (ref 11.5–15.5)
WBC: 10 10*3/uL (ref 4.0–10.5)
nRBC: 0 % (ref 0.0–0.2)
nRBC: 0 /100 WBC

## 2020-09-01 LAB — GLUCOSE, CAPILLARY
Glucose-Capillary: 194 mg/dL — ABNORMAL HIGH (ref 70–99)
Glucose-Capillary: 229 mg/dL — ABNORMAL HIGH (ref 70–99)
Glucose-Capillary: 215 mg/dL — ABNORMAL HIGH (ref 70–99)
Glucose-Capillary: 240 mg/dL — ABNORMAL HIGH (ref 70–99)
Glucose-Capillary: 231 mg/dL — ABNORMAL HIGH (ref 70–99)

## 2020-09-01 LAB — TACROLIMUS LEVEL: Tacrolimus (FK506) - LabCorp: 1.2 ng/mL — ABNORMAL LOW (ref 2.0–20.0)

## 2020-09-01 LAB — PHOSPHORUS: Phosphorus: 3.4 mg/dL (ref 2.5–4.6)

## 2020-09-01 LAB — MAGNESIUM: Magnesium: 2.2 mg/dL (ref 1.7–2.4)

## 2020-09-01 MED ORDER — HYDRALAZINE HCL 25 MG PO TABS
25.0000 mg | ORAL_TABLET | Freq: Three times a day (TID) | ORAL | Status: DC
  Administered 2020-09-02 – 2020-09-05 (×7): 25 mg via ORAL
  Filled 2020-09-01 (×9): qty 1

## 2020-09-01 MED ORDER — CARVEDILOL 12.5 MG PO TABS
12.5000 mg | ORAL_TABLET | Freq: Two times a day (BID) | ORAL | Status: DC
  Administered 2020-09-02 – 2020-09-07 (×5): 12.5 mg via ORAL
  Filled 2020-09-01 (×8): qty 1

## 2020-09-01 MED ORDER — SODIUM CHLORIDE 0.9 % IV SOLN: INTRAVENOUS | Status: DC

## 2020-09-01 MED ORDER — MYCOPHENOLATE MOFETIL 250 MG PO CAPS
500.0000 mg | ORAL_CAPSULE | Freq: Two times a day (BID) | ORAL | Status: DC
  Administered 2020-09-02 – 2020-09-12 (×18): 500 mg via ORAL
  Filled 2020-09-01 (×22): qty 2

## 2020-09-01 MED ORDER — INSULIN GLARGINE 100 UNIT/ML ~~LOC~~ SOLN
20.0000 [IU] | Freq: Every day | SUBCUTANEOUS | Status: DC
  Filled 2020-09-01: qty 0.2

## 2020-09-01 NOTE — Plan of Care (Signed)

## 2020-09-01 NOTE — Progress Notes (Addendum)
PROGRESS NOTE    [name]  WUJ:[id number] DOB: Jul 29, 1946 DOA: 08/26/2020 PCP: [name], MD     Brief Narrative:   [name] is a 74 y.o. 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  Patient had reportedly been complaining of some left foot pain and nausea last night but otherwise seem to be in his usual state when his wife left the house today but she was later called by a home health worker who reported that the patient was confused.  He was able to open the door for the home health agent but unable to answer any of her basic questions.  Patient is unable to contribute to the history.   ED Course: Upon arrival to the ED, patient is found to be febrile to 39.3 C, saturating 98% on 2 L/min of supplemental oxygen, and hypertensive with systolic pressure as high as 237.  EKG features sinus tachycardia with rate 105.  Noncontrast head CT negative for acute intracranial abnormality.  Chest x-ray notable for cardiomegaly with mild diffuse interstitial edema.  Radiographs of the left foot demonstrate soft tissue swelling about the left fifth MTP without acute underlying bony abnormality.  No acute findings on CT of the abdomen and pelvis.  Chemistry panel features a glucose of 232, bicarbonate 17, and creatinine 1.85.  CBC notable for mild leukocytosis and thrombocytopenia, and a improved normocytic anemia.  Lactic acid is 1.9.  BNP 532.  Blood and urine cultures were collected in the emergency department patient was given 500 cc of saline, 40 mg IV Lasix, vancomycin, cefepime, and Flagyl.  He was started on nitroglycerin infusion for blood pressure.  COVID-19 pcr is negative.   Subjective: 12/13 afebrile overnight A/O x4, but with still some confusion.  Short-term memory  loss.   Assessment & Plan: Covid vaccination; vaccinated   Principal Problem:   Sepsis due to cellulitis [facility]) Active Problems:   Kidney transplant status, cadaveric   Essential hypertension   Acute encephalopathy   Anemia due to chronic kidney disease   Diabetes mellitus type 2, insulin dependent (HCC)   Chronic kidney disease, stage 3b (HCC)   Hypertensive emergency   Anemia, unspecified   Chronic renal failure, stage 3b ([place])   Renal transplant, status post   Diabetes mellitus type 2, controlled, with complications ([place])   Diabetic nephropathy ([place])   Pressure injury of skin   Malnutrition of moderate degree   Idiopathic chronic gout of multiple sites without tophus   Systolic and diastolic CHF, acute (HCC)  Sepsis/osteomyelitis LEFT fifth metatarsal -Upon admission patient met criteria for sepsis temp > 38 C, RR> 20, point of infection cellulitis bilateral lower extremity and possible osteomyelitis of LEFT fifth metatarsal  -Continue current antibiotic treatment -Blood cultures pending -MRI LEFT foot; poor MRI secondary to motion artifact however infection, and erosions of metatarsals bone see report below. -Lactic acid/procalcitonin pending we will used to determine narrowing of antibiotic therapy. -12/9 D5-0.9% saline 1107m/hr (held) secondary to fluid overload -12/10 ABI bilateral lower extremity pending -12/10 after ABI obtained will contact Dr. DSharol Givenand discuss findings, is this because of sepsis and is surgery warranted? -12/11 discussed case with Dr. DSharol Givenorthopedic surgery who agreed to see patient..  ADDENDUM; received call from Dr. DSharol Given felt that all of patient's lower extremity pain secondary to his  gout and no surgical intervention warranted.   Acute encephalopathy  -Head CT negative and no focal neuro deficits identified  -Not convinced patient encephalopathic.  Patient appears to be hard of hearing as well as a little bit slow however if you speak loudly  and clearly and give him a few minutes to think he answers appropriately. -Tacrolimus level pending -RPR nonreactive -Ammonia WNL -TSH WNL -B12/WNL  -All sedating medication except for gabapentin. -12/12 patient's cognition significantly proved. -See goals of care  Renal transplant/CKD stage IIIb (baseline Cr ~1.85) -Renally dose all medications -Hold all renal toxic medication -12/11 DC CellCept 500 mg BID per nephrology recommendation -12/10 Increase Prednisone 50 mg daily for possible Gout Flare. (Normal dose 5 mg daily)  -Tacrolimus 1 mg BID Lab Results  Component Value Date   CREATININE 3.27 (H) 09/01/2020   CREATININE 3.03 (H) 08/31/2020   CREATININE 2.62 (H) 08/30/2020   CREATININE 2.27 (H) 08/29/2020   CREATININE 2.37 (H) 08/28/2020  -12/11 DC D5-0.9% saline 14m/hr -12/11 change to LR 569mhr per nephrology recommendation; believe patient fluid overloaded (held) -12/13 per nephrology continue to hold diuretics and IV fluids.  Anemia chronic disease (baseline HgB 10.9) -Most likely secondary to CKD, anemia of chronic disease? -Anemia panel; consistent with anemia of chronic disease -Haptoglobin/LDH pending -Fecal occult blood pending  Hypertensive emergency -BP> 180 with chest pain, possible ischemia -Continue nitroglycerin drip -12/9 Lasix 80 mg daily (hold) renal functioning deteriorating -12/13 decrease Coreg 12.5 mg BID (believes secondary to patient's worsening renal function obtaining a buildup) -12/13 decrease Hydralazine increase 25 mg TID (buildup secondary to worsening renal function) -Strict ins and outs +7.2 L -Daily weight Filed Weights   08/30/20 0500 08/31/20 0500 09/01/20 0459  Weight: 87.8 kg 89.7 kg 91.5 kg  -12/12 Lasix IV 60 mg x 1.  Believe patient fluid overloaded  Acute Systolic and Diastolic CHF -Patient asked uncontrolled HTN DC Solu-Cortef -September 2021 EF 55 to 60% -See HTN emergency -12/10 echocardiogram LVEF= 40 to 4575%with  diastolic dysfunction see results below -12/12 with worsening cardiac function will consult cardiology.  Appears last saw Dr. HeDaneen Schick/17/2020 -12/12 spoke with Dr. KaOttie Glazierardiology agreed to see patient will await further recommendations -12/13 per cardiology advise if ischemic evaluation is needed, but this would not be scheduled until his general medical condition improves.  Agree patient fluid and require diuresis however also secondary to his current renal function should be managed by nephrology.  Chest pain -See hypertensive emergency  Hypotension --12/10 Albumin 25g  X 1   Pleural effusion -Moderate pleural effusion seen on echocardiogram. -12/12 PCXR; small effusion and pulmonary congestion see results below  DM type II controlled with complication/DM nephropathy -12/8 hemoglobin A1c= 6.6 -12/13 increase Lantus 20 units daily -12/12 increase NovoLog 10 units  -Sensitive SSI -Tradjenta 5 mg daily  Gout -12/9 Uric acid = 10.7  -When patient's renal function begins to improve we will start colchicine -Other than his LEFT FIFTH METATARSAL NO JOINT PAIN. -Patient on prednisone for his renal transplant; may be a gout flare,See Renal Transplant .  Hypomagnesmia -Magnesium goal>2  Bilateral arm pain chronic -Although chronic since patient not able to adequately describe if it is only in the joints will obtain upper extremity Doppler ultrasound R/O DVT  Goals of care -12/12 PT/OT consult; evaluate patient for placement to SNF VS CIR.  Multisystem organ failure   DVT prophylaxis: Heparin subcu Code Status: Full Family Communication:  Status is: Inpatient    Dispo:  The patient is from: Home              Anticipated d/c is to: Home              Anticipated d/c date is: 12/12              Patient currently unstable      Consultants:  Dr. Ottie Glazier cardiology Dr. Sharol Given orthopedic surgery Nephrology    Procedures/Significant Events:  12/7  PCXR;Cardiomegaly and mild, diffuse interstitial pulmonary opacity, likely edema. No focal airspace opacity 12/7 CT abdomen pelvis W. Wo contrast;-Small bibasilar pleural effusions and compressive atelectasis in the RIGHT lower lobe. -Atrophic native kidneys with old calcified renal transplant in RIGHT iliac fossa. -Transplant kidney LEFT iliac fossa without mass or hydronephrosis. -Scattered colonic diverticulosis without evidence of diverticulitis. -No definite acute intra-abdominal or intrapelvic abnormalities. 12/10 MRI LEFT foot W0 contrast;-prominent erosions and irregularity along the articular surfaces of the first MTP joint and in the distal half of the proximal phalanx of the great toe, and in the base of the distal phalanx fourth toe. The phalanges of the fifth toe are somewhat obscured by motion artifact, but I not see clear evidence of osteomyelitis. 2. Small erosion of the base of the distal phalanx fourth toe. 3. Circumferential edema in the forefoot, especially dorsally, cellulitis is not excluded. No drainable fluid collection is identified. 4. Generalized muscular atrophy in the forefoot. Low-grade edema is likely neurogenic. 5. Despite efforts by the technologist and patient, severe motion artifact is present on today's exam and could not be eliminated. This reduces exam sensitivity and specificity. The patient terminated the exam prior to the acquisition of sagittal images and would not allow repeat series to correct for the severely motion degraded images.  12/11 bilateral lower extremity ABIs;Right: Resting right ankle-brachial index indicates noncompressible right  lower extremity arteries.   Absent TBI.  Left: Resting left ankle-brachial index indicates noncompressible left  lower extremity arteries.  12/10 Echocardiogram;LVEF=40 to 45%. Left ventricle demonstrates regional wall motion abnormalities.  -Moderate LVH -Grade III diastolic dysfunction   -The  inferior wall is akinetic.  Left Atrium: moderately dilated.   Aorta: moderate dilatation of the ascending aorta, measuring 45 mm.  Additional Comments: There is a moderate pleural effusion.  12/11 PCXR;-small left pleural effusion. Increased bibasilar opacities. -Cardiomegaly and central pulmonary vascular congestion.    I have personally reviewed and interpreted all radiology studies and my findings are as above.  VENTILATOR SETTINGS:    Cultures 12/7 blood LEFT hand NGTD  12/7 RIGHT arm NGTD  12/7 urine insignificant growth 12/8 MRSA by PCR negative    Antimicrobials: Anti-infectives (From admission, onward)   Start     Ordered Stop   08/26/20 2359  cefTRIAXone (ROCEPHIN) 1 g in sodium chloride 0.9 % 100 mL IVPB        08/26/20 2028     08/26/20 1545  ceFEPIme (MAXIPIME) 2 g in sodium chloride 0.9 % 100 mL IVPB        08/26/20 1532 08/26/20 1631   08/26/20 1545  metroNIDAZOLE (FLAGYL) tablet 500 mg  Status:  Discontinued        08/26/20 1532 08/26/20 2015   08/26/20 1545  vancomycin (VANCOCIN) IVPB 1000 mg/200 mL premix        08/26/20 1532 08/26/20 1823       Devices    LINES / TUBES:      Continuous Infusions: . sodium chloride    . cefTRIAXone (ROCEPHIN)  IV Stopped (09/01/20 0032)  . nitroGLYCERIN Stopped (08/29/20 0846)     Objective: Vitals:   09/01/20 0459 09/01/20 0600 09/01/20 0607 09/01/20 0709  BP:  120/63    Pulse:  72 73 71  Resp:  (!) _0 Temp:      TempSrc:      SpO2:  100% 100% 100%  Weight: 91.5 kg       Intake/Output Summary (Last 24 hours) at 09/01/2020 0759 Last data filed at 09/01/2020 0300 Gross per 24 hour  Intake 503.36 ml  Output 250 ml  Net 253.36 ml   Filed Weights   08/30/20 0500 08/31/20 0500 09/01/20 0459  Weight: 87.8 kg 89.7 kg 91.5 kg    Physical Exam:  General: A/O x4, No acute respiratory distress Eyes: negative scleral hemorrhage, negative anisocoria, negative icterus ENT: Negative Runny  nose, negative gingival bleeding, Neck:  Negative scars, masses, torticollis, lymphadenopathy, JVD Lungs: Clear to auscultation bilaterally without wheezes or crackles Cardiovascular: Regular rate and rhythm without murmur gallop or rub normal S1 and S2 Abdomen: negative abdominal pain, nondistended, positive soft, bowel sounds, no rebound, no ascites, no appreciable mass Extremities: continued pain to palpation in bilateral upper and lower extremities however significantly decreased from 12/12.  Pain worse in L LE ankle and foot Skin: Multiple bilateral lower extremity laceration/lesions.  Erythema decreased, pain to palpation decreased Psychiatric:  Negative depression, negative anxiety, negative fatigue, negative mania  Central nervous system:  Cranial nerves II through XII intact, tongue/uvula midline, all extremities muscle strength 5/5, sensation intact throughout, negative dysarthria, negative expressive aphasia, negative receptive aphasia. .     Data Reviewed: Care during the described time interval was provided by me .  I have reviewed this patient's available data, including medical history, events of note, physical examination, and all test results as part of my evaluation.  CBC: Recent Labs  Lab 08/28/20 0244 08/29/20 0243 08/30/20 0221 08/31/20 0244 09/01/20 0237  WBC 7.5 6.8 8.6 11.1* 10.0  NEUTROABS 6.0 4.8 6.9 10.0* 9.4*  HGB 8.4* 9.8* 8.9* 8.2* 7.8*  HCT 28.5* 33.1* 30.5* 28.3* 27.0*  MCV 96.6 96.8 95.6 95.0 96.4  PLT 91* 116* 79* 76* 87*   Basic Metabolic Panel: Recent Labs  Lab 08/28/20 0244 08/29/20 0243 08/30/20 0221 08/31/20 0244 09/01/20 0237  NA 142 142 142 142 135  K 3.8 4.0 4.0 4.3 4.6  CL 112* 112* 115* 113* 107  CO2 19* 18* 17* 17* 18*  GLUCOSE 147* 205* 291* 319* 185*  BUN 42* 49* 51* 69* 92*  CREATININE 2.37* 2.27* 2.62* 3.03* 3.27*  CALCIUM 9.1 9.1 8.3* 8.7* 8.5*  MG 1.6* 1.7 2.0 2.1 2.2  PHOS 2.3* 2.1* 2.8 2.8 3.4  3.4    GFR: Estimated Creatinine Clearance: 22.5 mL/min (A) (by C-G formula based on SCr of 3.27 mg/dL (H)). Liver Function Tests: Recent Labs  Lab 08/27/20 1227 08/28/20 0244 08/29/20 0243 08/30/20 0221 08/31/20 0244 09/01/20 0237  AST _1 --   ALT _2 --   ALKPHOS 83 61 80 67 65  --   BILITOT 1.1 1.0 0.6 0.8 0.6  --   PROT 5.9* 5.0* 5.6* 5.2* 5.3*  --   ALBUMIN 3.4* 2.9* 3.2* 2.9* 2.7* 2.6*   No results for input(s): LIPASE, AMYLASE in the last 168 hours. Recent Labs  Lab 08/26/20 2203  AMMONIA 31   Coagulation Profile: Recent Labs  Lab 08/26/20 1530 08/27/20 1654  INR 1.3* 1.3*   Cardiac Enzymes: No results for input(s): CKTOTAL, CKMB, CKMBINDEX, TROPONINI in the last 168 hours. BNP (last 3 results) No results for input(s): PROBNP in the last 8760 hours. HbA1C: No results for input(s): HGBA1C in the last 72 hours. CBG: Recent Labs  Lab 08/31/20 0808 08/31/20 1222 08/31/20 1713 08/31/20 2123 09/01/20 0731  GLUCAP 304* 269* 215* 147* 194*   Lipid Profile: No results for input(s): CHOL, HDL, LDLCALC, TRIG, CHOLHDL, LDLDIRECT in the last 72 hours. Thyroid Function Tests: No results for input(s): TSH, T4TOTAL, FREET4, T3FREE, THYROIDAB in the last 72 hours. Anemia Panel: No results for input(s): VITAMINB12, FOLATE, FERRITIN, TIBC, IRON, RETICCTPCT in the last 72 hours. Sepsis Labs: Recent Labs  Lab 08/27/20 1616 08/27/20 1648 08/27/20 1940 08/28/20 0244 08/28/20 0944 08/28/20 1241 08/29/20 0243  PROCALCITON 2.60  --   --  2.88  --   --  2.17  LATICACIDVEN  --  1.7 2.1*  --  0.8 1.2  --     Recent Results (from the past 240 hour(s))  Blood Culture (routine x 2)     Status: None   Collection Time: 08/26/20  3:30 PM   Specimen: BLOOD LEFT HAND  Result Value Ref Range Status   Specimen Description   Final    BLOOD LEFT HAND Performed at [facility] [address] [place], [place] [address]    Special  Requests   Final    BOTTLES DRAWN AEROBIC AND ANAEROBIC Blood Culture adequate volume Performed at [facility] [address], [place], [place] [address]    Culture   Final    NO GROWTH 5 DAYS Performed at [facility], [place] [address], [place], [place] [address]    Report Status 08/31/2020 FINAL  Final  Blood Culture (routine x 2)     Status: None   Collection Time: 08/26/20  3:30 PM   Specimen: BLOOD  Result Value Ref Range Status   Specimen Description   Final    BLOOD RIGHT ARM Performed at [facility] [address] [place], [place] [address]    Special Requests   Final    BOTTLES DRAWN AEROBIC AND ANAEROBIC Blood Culture results may not be optimal due to an inadequate volume of blood received in culture bottles Performed at [facility] [address], [place], [place] [address]    Culture   Final    NO GROWTH 5 DAYS Performed at [facility], [place] [address], [place], [place] [address]    Report Status 08/31/2020 FINAL  Final  Urine culture     Status: Abnormal   Collection Time: 08/26/20  3:33 PM   Specimen: In/Out Cath Urine  Result Value Ref Range Status   Specimen Description   Final    IN/OUT CATH URINE Performed at [facility] [address], [place], [place] [address]    Special Requests   Final    NONE Performed at [facility], [place] [address], [place],  [address]    Culture (A)  Final    <10,000 COLONIES/mL INSIGNIFICANT GROWTH Performed at [facility] [address], [place],  [address]    Report Status 08/28/2020 FINAL  Final  Resp Panel by RT-PCR (Flu A&B, Covid) In/Out Cath Urine     Status: None   Collection Time: 08/26/20  4:30 PM   Specimen: In/Out Cath Urine; Nasopharyngeal(NP) swabs in vial  transport medium  Result Value Ref Range Status   SARS Coronavirus 2 by RT PCR NEGATIVE NEGATIVE Final    Comment:  (NOTE) SARS-CoV-2 target nucleic acids are NOT DETECTED.  The SARS-CoV-2 RNA is generally detectable in upper respiratory specimens during the acute phase of infection. The lowest concentration of SARS-CoV-2 viral copies this assay can detect is 138 copies/mL. A negative result does not preclude SARS-Cov-2 infection and should not be used as the sole basis for treatment or other patient management decisions. A negative result may occur with  improper specimen collection/handling, submission of specimen other than nasopharyngeal swab, presence of viral mutation(s) within the areas targeted by this assay, and inadequate number of viral copies(<138 copies/mL). A negative result must be combined with clinical observations, patient history, and epidemiological information. The expected result is Negative.  Fact Sheet for Patients:  EntrepreneurPulse.com.au  Fact Sheet for Healthcare Providers:  IncredibleEmployment.be  This test is no t yet approved or cleared by the [place] FDA and  has been authorized for detection and/or diagnosis of SARS-CoV-2 by FDA under an Emergency Use Authorization (EUA). This EUA will remain  in effect (meaning this test can be used) for the duration of the COVID-19 declaration under Section 564(b)(1) of the Act, 21 U.S.C.section 360bbb-3(b)(1), unless the authorization is terminated  or revoked sooner.       Influenza A by PCR NEGATIVE NEGATIVE Final   Influenza B by PCR NEGATIVE NEGATIVE Final    Comment: (NOTE) The Xpert Xpress SARS-CoV-2/FLU/RSV plus assay is intended as an aid in the diagnosis of influenza from Nasopharyngeal swab specimens and should not be used as a sole basis for treatment. Nasal washings and aspirates are unacceptable for Xpert Xpress SARS-CoV-2/FLU/RSV testing.  Fact Sheet for Patients: EntrepreneurPulse.com.au  Fact Sheet for Healthcare  Providers: IncredibleEmployment.be  This test is not yet approved or cleared by the [place] FDA and has been authorized for detection and/or diagnosis of SARS-CoV-2 by FDA under an Emergency Use Authorization (EUA). This EUA will remain in effect (meaning this test can be used) for the duration of the COVID-19 declaration under Section 564(b)(1) of the Act, 21 U.S.C. section 360bbb-3(b)(1), unless the authorization is terminated or revoked.  Performed at [facility], [place] [address], [place], [place] [address]   MRSA PCR Screening     Status: None   Collection Time: 08/27/20 12:22 PM   Specimen: Nasal Mucosa; Nasopharyngeal  Result Value Ref Range Status   MRSA by PCR NEGATIVE NEGATIVE Final    Comment:        The GeneXpert MRSA Assay (FDA approved for NASAL specimens only), is one component of a comprehensive MRSA colonization surveillance program. It is not intended to diagnose MRSA infection nor to guide or monitor treatment for MRSA infections. Performed at [facility], [place] [address] [place], [place] [address]          Radiology Studies: DG CHEST PORT 1 VIEW  Result Date: 08/31/2020 CLINICAL DATA:  Pleural effusion EXAM: PORTABLE CHEST 1 VIEW COMPARISON:  08/26/2020 and prior. FINDINGS: Cardiomegaly and central pulmonary vascular congestion. Patchy bibasilar opacities, more conspicuous on the left. No pneumothorax. Likely small left pleural effusion however the left costophrenic sulcus is partially imaged. IMPRESSION: Small left pleural effusion.  Increased bibasilar opacities. Cardiomegaly and central pulmonary vascular congestion. Electronically Signed   By: Primitivo Gauze M.D.   On: 08/31/2020 07:42   VAS [place] UPPER EXTREMITY VENOUS DUPLEX  Result Date: 08/31/2020 UPPER VENOUS  STUDY  Indications: Pain, and Swelling Comparison Study: no prior Performing Technologist: Abram Sander RVS  Examination  Guidelines: A complete evaluation includes B-mode imaging, spectral Doppler, color Doppler, and power Doppler as needed of all accessible portions of each vessel. Bilateral testing is considered an integral part of a complete examination. Limited examinations for reoccurring indications may be performed as noted.  Left Findings: +----------+------------+---------+-----------+----------+-----------------+ LEFT      CompressiblePhasicitySpontaneousProperties     Summary      +----------+------------+---------+-----------+----------+-----------------+ IJV           Full       Yes       Yes                                +----------+------------+---------+-----------+----------+-----------------+ Subclavian    Full       Yes       Yes                                +----------+------------+---------+-----------+----------+-----------------+ Axillary      Full       Yes       Yes                                +----------+------------+---------+-----------+----------+-----------------+ Brachial      Full       Yes       Yes                                +----------+------------+---------+-----------+----------+-----------------+ Radial        Full                                                    +----------+------------+---------+-----------+----------+-----------------+ Ulnar         Full                                                    +----------+------------+---------+-----------+----------+-----------------+ Cephalic      None                                  Age Indeterminate +----------+------------+---------+-----------+----------+-----------------+ Basilic       Full                                                    +----------+------------+---------+-----------+----------+-----------------+  Summary:  Left: No evidence of deep vein thrombosis in the upper extremity. Findings consistent with age indeterminate superficial vein thrombosis  involving the left cephalic vein.  *See table(s) above for measurements and observations.    Preliminary         Scheduled Meds: . (feeding supplement) PROSource Plus  30 mL Oral BID BM  . allopurinol  100 mg Oral Daily  . aspirin EC  162  mg Oral BID  . atorvastatin  10 mg Oral Daily  . calcitRIOL  0.25 mcg Oral Daily  . carvedilol  25 mg Oral BID WC  . Chlorhexidine Gluconate Cloth  6 each Topical Daily  . heparin  5,000 Units Subcutaneous Q8H  . hydrALAZINE  50 mg Oral TID  . insulin aspart  0-5 Units Subcutaneous QHS  . insulin aspart  0-9 Units Subcutaneous TID WC  . insulin aspart  10 Units Subcutaneous TID WC  . insulin glargine  15 Units Subcutaneous Daily  . latanoprost  1 drop Both Eyes QHS  . linagliptin  5 mg Oral Daily  . mouth rinse  15 mL Mouth Rinse BID  . multivitamin with minerals  1 tablet Oral Daily  . nutrition supplement (JUVEN)  1 packet Oral BID BM  . predniSONE  50 mg Oral Daily  . sodium bicarbonate  650 mg Oral TID  . sodium chloride flush  3 mL Intravenous Q12H  . sodium chloride flush  3 mL Intravenous Q12H  . tacrolimus  1 mg Oral BID   Continuous Infusions: . sodium chloride    . cefTRIAXone (ROCEPHIN)  IV Stopped (09/01/20 0032)  . nitroGLYCERIN Stopped (08/29/20 0846)     LOS: 6 days    Time spent:40 min    Manolito Jurewicz, Geraldo Docker, MD [facility] Pager [phone]  If 7PM-7AM, please contact [email or web address] Password [email or web address] 09/01/2020, 7:59 AM

## 2020-09-01 NOTE — Consult Note (Signed)
Cardiology Consultation:   Patient ID: [name]; [id number]; 1946-07-31   Admit date: 08/26/2020 Date of Consult: 09/01/2020  Primary Care Provider: [name], MD Primary Cardiologist: Sinclair Grooms, MD 05/07/2019 L. Servando Snare, NP 01/29/2020 Primary Electrophysiologist:  None   Patient Profile:   [name] is a 74 y.o. male with a hx of HFpEF, ESRD s/p TX x 2, DM, HTN, HLD, Afib, R 2nd toe wound followed by Dr Scot Dock, who is being seen today for the evaluation of decreased EF at the request of Dr Sherral Hammers.  His weight was 195 pounds on 9/17, it is listed as 179 pounds on 12/9, but this may not be correct.  His weight has climbed steadily since then, 190 lbs >> 193 >> 197 >> 201 lbs. I/O + 7.1 L since admission.  Despite all of the fluid resuscitation, his creatinine has steadily since admission, 1.85 >> 2.37>> 2.27 >> 2.62 >> 3.03 >> 3.27.   Lasix 40 mg IV 12/07, 80 mg po 12/08, 60 mg IV 12/12  History of Present Illness:   [name] was admitted 12/07 with confusion, SIRS, ? Sepsis 2nd cellulitis L 5th metatarsal, acute encephalopathy, HTN urgency. EF prev normal, now 40-45%, cards asked to see.   [name] is very concerned because he says they took him off his CellCept and took him off his Lasix.  He feels this is responsible for some of his problems.  When he was told that his creatinine was 2.7 at 1 point this admission, he was thrilled, says that is lower than his recent normal.  He is aware that he had problems with infection in his legs.  He does not remember much about right after he came in.  He does not remember being seen by the nephrologist.  His mental status is much improved, he is alert and oriented x3.  He feels his breathing is good.  He has not had much chest pain.  He does not think he was having any respiratory problems before he came into the hospital.   Past Medical History:  Diagnosis Date   Anemia    Atrial fibrillation  (HCC)    CHF (congestive heart failure) ([place])    Chronic kidney disease    Diabetes mellitus    Dysrhythmia    Hyperlipidemia    Hypertension    Neuropathy    Wound of left leg 08/2018    Past Surgical History:  Procedure Laterality Date   ABDOMINAL AORTOGRAM W/LOWER EXTREMITY N/A 10/12/2019   Procedure: ABDOMINAL AORTOGRAM W/LOWER EXTREMITY;  Surgeon: Angelia Mould, MD;  Location: [facility];  Service: Cardiovascular;  Laterality: N/A;  With CO2   access proceds     achlles tendon repair     AMPUTATION Right 05/22/2020   Procedure: RIGHT INDEX FINGER DEBRIDEMENT AND AMPUTATION;  Surgeon: Leanora Cover, MD;  Location: [facility];  Service: Orthopedics;  Laterality: Right;   ARTERIOVENOUS GRAFT PLACEMENT     bowel repla     repair   ENDOVENOUS ABLATION SAPHENOUS VEIN W/ LASER Right 10/04/2019   endovenous laser ablation right greater saphenous vein by Gae Gallop MD    [place] and 2005   LIGATION GORETEX GRAFT  06/30/11   Right upper arm     Prior to Admission medications   Medication Sig Start Date End Date Taking? Authorizing Provider  acetaminophen (TYLENOL) 500 MG tablet Take 1,000 mg by mouth every 6 (six) hours as  needed for mild pain or moderate pain.   Yes [provider]  allopurinol (ZYLOPRIM) 100 MG tablet Take 1 tablet (100 mg total) by mouth daily. 06/06/20 06/06/21 Yes Florencia Reasons, MD  aspirin 81 MG tablet Take 162 mg by mouth 2 (two) times daily.    Yes [provider]  atorvastatin (LIPITOR) 10 MG tablet Take 10 mg by mouth daily.   Yes [provider]  calcitRIOL (ROCALTROL) 0.25 MCG capsule Take 0.25 mcg by mouth daily.  07/22/15  Yes [provider]  carvedilol (COREG) 6.25 MG tablet Take 1 tablet (6.25 mg total) by mouth 2 (two) times daily with a meal. 01/29/20  Yes Burtis Junes, NP  diphenhydrAMINE (BENADRYL) 25 mg capsule Take 1 capsule (25 mg total) by mouth  at bedtime as needed (for sleep/allergies.). 09/11/18  Yes Regalado, Belkys A, MD  doxycycline (VIBRAMYCIN) 100 MG capsule Take 100 mg by mouth 2 (two) times daily. 10 DS 08/22/20  Yes [provider]  furosemide (LASIX) 40 MG tablet Take 1 tablet (40 mg total) by mouth daily. Patient taking differently: Take 80 mg by mouth daily.  06/06/20  Yes Florencia Reasons, MD  gabapentin (NEURONTIN) 100 MG capsule Take 100 mg by mouth See admin instructions. Take 1 capule (100mg ) three times/day as needed for neuropathy or 1 capsule (100mg ) at bedtime   Yes [provider]  HUMALOG KWIKPEN 100 UNIT/ML KwikPen Inject 3-5 Units into the skin 3 (three) times daily. Sliding scale 07/17/18  Yes [provider]  hydrALAZINE (APRESOLINE) 25 MG tablet Take 25 mg by mouth 3 (three) times daily.  08/22/18  Yes [provider]  icosapent Ethyl (VASCEPA) 1 g capsule Take 2 g by mouth 2 (two) times daily.   Yes [provider]  LANTUS SOLOSTAR 100 UNIT/ML Solostar Pen Inject 7-15 Units into the skin daily. If blood sugars are >300, use 15 units, <300 use 7 units 06/04/14  Yes [provider]  latanoprost (XALATAN) 0.005 % ophthalmic solution Place 1 drop into both eyes at bedtime. 07/28/18  Yes [provider]  linagliptin (TRADJENTA) 5 MG TABS tablet Take 5 mg by mouth daily.     Yes [provider]  mycophenolate (CELLCEPT) 500 MG tablet Take 500 mg by mouth 2 (two) times daily.    Yes [provider]  oxyCODONE-acetaminophen (PERCOCET/ROXICET) 5-325 MG tablet Take 1-2 tablets by mouth every 6 (six) hours as needed for moderate pain.  05/23/20  Yes [provider]  predniSONE (DELTASONE) 5 MG tablet Take 5 mg by mouth daily. 07/07/18  Yes [provider]  tacrolimus (PROGRAF) 1 MG capsule Take 1 mg by mouth 2 (two) times daily.    Yes [provider]  Alcohol Swabs (B-D SINGLE USE SWABS REGULAR) PADS Use as directed 09/07/15    [provider]  BAYER CONTOUR NEXT TEST test strip Use as directed 09/12/15   [provider]  BD PEN NEEDLE NANO U/F 32G X 4 MM MISC Use as directed 09/25/15   [provider]  [place] Use as directed 07/14/15   [provider]  senna-docusate (SENOKOT-S) 8.6-50 MG tablet Take 1 tablet by mouth at bedtime. Patient not taking: Reported on 08/26/2020 06/06/20   Florencia Reasons, MD    Inpatient Medications: Scheduled Meds:  (feeding supplement) PROSource Plus  30 mL Oral BID BM   allopurinol  100 mg Oral Daily   aspirin EC  162 mg Oral BID   atorvastatin  10 mg Oral Daily   calcitRIOL  0.25 mcg Oral Daily   carvedilol  25 mg Oral BID WC   Chlorhexidine Gluconate Cloth  6 each Topical Daily   heparin  5,000 Units Subcutaneous Q8H   hydrALAZINE  50 mg Oral TID   insulin aspart  0-5 Units Subcutaneous QHS   insulin aspart  0-9 Units Subcutaneous TID WC   insulin aspart  10 Units Subcutaneous TID WC   insulin glargine  15 Units Subcutaneous Daily   latanoprost  1 drop Both Eyes QHS   linagliptin  5 mg Oral Daily   mouth rinse  15 mL Mouth Rinse BID   multivitamin with minerals  1 tablet Oral Daily   nutrition supplement (JUVEN)  1 packet Oral BID BM   predniSONE  50 mg Oral Daily   sodium bicarbonate  650 mg Oral TID   sodium chloride flush  3 mL Intravenous Q12H   sodium chloride flush  3 mL Intravenous Q12H   tacrolimus  1 mg Oral BID   Continuous Infusions:  sodium chloride     cefTRIAXone (ROCEPHIN)  IV Stopped (09/01/20 0032)   nitroGLYCERIN Stopped (08/29/20 0846)   PRN Meds: sodium chloride, acetaminophen **OR** acetaminophen, gabapentin, nitroGLYCERIN, ondansetron **OR** ondansetron (ZOFRAN) IV, senna-docusate, sodium chloride flush  Allergies:    Allergies  Allergen Reactions   Elavil [Amitriptyline Hcl] Other (See Comments)    Unknown felt as if he was tripping    Social History:   Social History    Socioeconomic History   Marital status: Married    Spouse name: Not on file   Number of children: Not on file   Years of education: Not on file   Highest education level: Not on file  Occupational History   Not on file  Tobacco Use   Smoking status: Never Smoker   Smokeless tobacco: Never Used  Vaping Use   Vaping Use: Never used  Substance and Sexual Activity   Alcohol use: No    Alcohol/week: 0.0 standard drinks   Drug use: No   Sexual activity: Not on file  Other Topics Concern   Not on file  Social History Narrative   Not on file   Social Determinants of Health   Financial Resource Strain: Not on file  Food Insecurity: Not on file  Transportation Needs: Not on file  Physical Activity: Not on file  Stress: Not on file  Social Connections: Not on file  Intimate Partner Violence: Not on file    Family History:   Family History  Problem Relation Age of Onset   Heart disease Mother    Heart attack Father    Stroke Sister    Family Status:  Family Status  Relation Name Status   Mother  Deceased   Father  Deceased   Sister  Deceased   Sister  Alive   MGM  Deceased   MGF  Deceased   PGM  Deceased   PGF  Deceased    ROS:  Please see the history of present illness.  All other ROS reviewed and negative.     Physical Exam/Data:   Vitals:   09/01/20 0600 09/01/20 0607 09/01/20 0709 09/01/20 0826  BP: 120/63     Pulse: 72 73 71   Resp: (!) 29 19 16    Temp:      TempSrc:      SpO2: 100% 100% 100%   Weight:      Height:    5\' 8"  (  1.727 m)    Intake/Output Summary (Last 24 hours) at 09/01/2020 0841 Last data filed at 09/01/2020 0300 Gross per 24 hour  Intake 503.36 ml  Output 250 ml  Net 253.36 ml    Last 3 Weights 09/01/2020 08/31/2020 08/30/2020  Weight (lbs) 201 lb 11.5 oz 197 lb 12 oz 193 lb 9 oz  Weight (kg) 91.5 kg 89.7 kg 87.8 kg     Body mass index is 30.67 kg/m.   General:  Well nourished, well developed,  male in no acute distress HEENT: normal Lymph: no adenopathy Neck: JVD -11 cm Endocrine:  No thryomegaly Vascular: No carotid bruits; upper extremity pulses 2+, lower extremity pulses decreased and capillary refill is delayed Cardiac:  normal S1, S2; RRR; no murmur Lungs: Decreased breath sounds bases with a few rales bilaterally, no wheezing, rhonchi   Abd: soft, nontender, no hepatomegaly  Ext: no edema Musculoskeletal:  No deformities, BUE and BLE strength normal and equal Skin: warm and dry  Neuro:  CNs 2-12 intact, no focal abnormalities noted Psych:  Normal affect   EKG:  The EKG was personally reviewed and demonstrates: With variable AV block, heart rate 94 Telemetry:  Telemetry was personally reviewed and demonstrates: Atrial flutter, heart rate generally controlled   CV studies:   ECHO: 08/29/2020 1. Left ventricular ejection fraction, by estimation, is 40 to 45%. The  left ventricle has mildly decreased function. The left ventricle  demonstrates regional wall motion abnormalities (see scoring  diagram/findings for description). There is moderate  left ventricular hypertrophy. Left ventricular diastolic parameters are  consistent with Grade III diastolic dysfunction (restrictive).  2. Right ventricular systolic function is normal. The right ventricular  size is normal.  3. Left atrial size was moderately dilated.  4. Right atrial size was mildly dilated.  5. Moderate pleural effusion.  6. The mitral valve is normal in structure. No evidence of mitral valve  regurgitation. No evidence of mitral stenosis. Moderate mitral annular  calcification.  7. The aortic valve is normal in structure. Aortic valve regurgitation is  mild. No aortic stenosis is present.  8. Aortic dilatation noted. There is mild dilatation at the level of the  sinuses of Valsalva, measuring 43 mm. There is moderate dilatation of the  ascending aorta, measuring 45 mm.  9. The inferior vena  cava is normal in size with greater than 50%  respiratory variability, suggesting right atrial pressure of 3 mmHg.   Comparison(s): No significant change from prior study. Prior images  reviewed side by side.   ECHO: 05/26/2020 1. Left ventricular ejection fraction, by estimation, is 55 to 60%. The  left ventricle has normal function. The left ventricle has no regional  wall motion abnormalities. Left ventricular diastolic parameters are  indeterminate. Elevated left ventricular  end-diastolic pressure.  2. Right ventricular systolic function is normal. The right ventricular  size is normal. Tricuspid regurgitation signal is inadequate for assessing  PA pressure.  3. The mitral valve is normal in structure. No evidence of mitral valve  regurgitation. No evidence of mitral stenosis.  4. The aortic valve is tricuspid. Aortic valve regurgitation is trivial.  Mild aortic valve sclerosis is present, with no evidence of aortic valve  stenosis.  5. There is mild dilatation of the aortic root measuring 42 mm.  6. The inferior vena cava is dilated in size with <50% respiratory  variability, suggesting right atrial pressure of 15 mmHg.    MYOVIEW: 02/17/2017  There was no ST segment deviation noted during  stress.  No T wave inversion was noted during stress.  Defect 1: There is a medium defect of moderate severity.  This is a low risk study.   Medium size, moderate intensity fixed septal defect, likely artifact, however, the study was not gated due to atrial flutter. No reversible ischemia. Low risk study.   LUE DOPPLER: 08/31/2020 Summary:  Left:  No evidence of deep vein thrombosis in the upper extremity. 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   Laboratory Data:   Chemistry Recent Labs  Lab 08/30/20 0221 08/31/20 0244 09/01/20 0237  NA 142 142 135  K 4.0 4.3 4.6  CL 115* 113* 107  CO2 17* 17*  18*  GLUCOSE 291* 319* 185*  BUN 51* 69* 92*  CREATININE 2.62* 3.03* 3.27*  CALCIUM 8.3* 8.7* 8.5*  GFRNONAA 25* 21* 19*  ANIONGAP 10 12 10     Lab Results  Component Value Date   ALT 9 08/31/2020   AST 22 08/31/2020   ALKPHOS 65 08/31/2020   BILITOT 0.6 08/31/2020   Hematology Recent Labs  Lab 08/30/20 0221 08/31/20 0244 09/01/20 0237  WBC 8.6 11.1* 10.0  RBC 3.19* 2.98* 2.80*  HGB 8.9* 8.2* 7.8*  HCT 30.5* 28.3* 27.0*  MCV 95.6 95.0 96.4  MCH 27.9 27.5 27.9  MCHC 29.2* 29.0* 28.9*  RDW 14.6 14.7 14.5  PLT 79* 76* 87*   Cardiac Enzymes High Sensitivity Troponin:   Recent Labs  Lab 08/27/20 1227 08/27/20 1616 08/27/20 1654  TROPONINIHS 97* 88* 95*      BNP Recent Labs  Lab 08/26/20 1530  BNP 532.0*    DDimer No results for input(s): DDIMER in the last 168 hours. TSH:  Lab Results  Component Value Date   TSH 2.205 08/26/2020   Lipids:No results found for: CHOL, HDL, LDLCALC, LDLDIRECT, TRIG, CHOLHDL HgbA1c: Lab Results  Component Value Date   HGBA1C 6.6 (H) 08/27/2020   Magnesium:  Magnesium  Date Value Ref Range Status  09/01/2020 2.2 1.7 - 2.4 mg/dL Final    Comment:    Performed at [facility], [place] [address], [place], [place] [address]     Radiology/Studies:  MR FOOT LEFT WO CONTRAST  Result Date: 08/28/2020 CLINICAL DATA:  Osteomyelitis of the foot EXAM: MRI OF THE LEFT FOOT WITHOUT CONTRAST TECHNIQUE: Multiplanar, multisequence MR imaging of the left forefoot was performed. No intravenous contrast was administered. COMPARISON:  Radiographs 08/26/2020 FINDINGS: Despite efforts by the technologist and patient, severe motion artifact is present on today's exam and could not be eliminated. This reduces exam sensitivity and specificity. The patient terminated the exam prior to the acquisition of sagittal images and would not allow repeat series to correct for the severely motion degraded images. This reduces overall exam  sensitivity and specificity. Bones/Joint/Cartilage Irregular erosions along the articular surfaces of the first MTP joint. Prominent erosions and irregularity in the distal half of the proximal phalanx of the great toe, and in the base of the distal phalanx. No overt malalignment at the Lisfranc joint. There are degenerative findings along the Lisfranc joint. No bony destructive findings at the fifth MTP joint identified. No joint effusion is identified. The phalanges of the fifth toe are somewhat obscured by motion artifact. My sense is that there is probably some soft tissue swelling in the small toe but on the images not severely adversely affected by motion artifact, I not see clear evidence of osteomyelitis. Small erosion  of the base of the distal phalanx fourth toe. Ligaments The Lisfranc ligament appears to be intact. Muscles and Tendons Generalized muscular atrophy in the forefoot. Low-grade edema is likely neurogenic. Soft tissues Circumferential edema in the forefoot but especially dorsally, cellulitis is not excluded. No drainable fluid collection is identified in the soft tissues. IMPRESSION: 1. Prominent erosions and irregularity along the articular surfaces of the first MTP joint and in the distal half of the proximal phalanx of the great toe, and in the base of the distal phalanx fourth toe. The phalanges of the fifth toe are somewhat obscured by motion artifact, but I not see clear evidence of osteomyelitis. 2. Small erosion of the base of the distal phalanx fourth toe. 3. Circumferential edema in the forefoot, especially dorsally, cellulitis is not excluded. No drainable fluid collection is identified. 4. Generalized muscular atrophy in the forefoot. Low-grade edema is likely neurogenic. 5. Despite efforts by the technologist and patient, severe motion artifact is present on today's exam and could not be eliminated. This reduces exam sensitivity and specificity. The patient terminated the exam prior  to the acquisition of sagittal images and would not allow repeat series to correct for the severely motion degraded images. Electronically Signed   By: Van Clines M.D.   On: 08/28/2020 13:36   DG CHEST PORT 1 VIEW  Result Date: 08/31/2020 CLINICAL DATA:  Pleural effusion EXAM: PORTABLE CHEST 1 VIEW COMPARISON:  08/26/2020 and prior. FINDINGS: Cardiomegaly and central pulmonary vascular congestion. Patchy bibasilar opacities, more conspicuous on the left. No pneumothorax. Likely small left pleural effusion however the left costophrenic sulcus is partially imaged. IMPRESSION: Small left pleural effusion.  Increased bibasilar opacities. Cardiomegaly and central pulmonary vascular congestion. Electronically Signed   By: Primitivo Gauze M.D.   On: 08/31/2020 07:42   VAS [place] ABI WITH/WO TBI  Result Date: 08/31/2020 LOWER EXTREMITY DOPPLER STUDY Indications: Ulceration- chronic LT foot wound. High Risk Factors: Hypertension, hyperlipidemia, Diabetes.  Comparison Study: 10-12-2019 Abdominal aortogram w/ lower extremity study                   08-09-2019 ABI w/ TBI Performing Technologist: Darlin Coco RDMS  Examination Guidelines: A complete evaluation includes at minimum, Doppler waveform signals and systolic blood pressure reading at the level of bilateral brachial, anterior tibial, and posterior tibial arteries, when vessel segments are accessible. Bilateral testing is considered an integral part of a complete examination. Photoelectric Plethysmograph (PPG) waveforms and toe systolic pressure readings are included as required and additional duplex testing as needed. Limited examinations for reoccurring indications may be performed as noted.  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                   +---------+------------------+-----+----------+--------------------------------+  PTA       255                2.26  monophasic                                   +---------+------------------+-----+----------+--------------------------------+  DP        255                2.26  monophasic                                   +---------+------------------+-----+----------+--------------------------------+  Great Toe                          Absent                                       +---------+------------------+-----+----------+--------------------------------+ +---------+------------------+-----+----------+-------+  Left      Lt Pressure (mmHg) Index Waveform   Comment  +---------+------------------+-----+----------+-------+  Brachial  113                      triphasic           +---------+------------------+-----+----------+-------+  PTA       255                2.26  monophasic          +---------+------------------+-----+----------+-------+  DP        255                2.26  monophasic          +---------+------------------+-----+----------+-------+  Great Toe                          Absent              +---------+------------------+-----+----------+-------+ +-------+-----------+-----------+------------+------------+  ABI/TBI Today's ABI Today's TBI Previous ABI Previous TBI  +-------+-----------+-----------+------------+------------+  Right   [place]          Absent      [place]           0.40          +-------+-----------+-----------+------------+------------+  Left    [place]          Absent      [place]           0.24          +-------+-----------+-----------+------------+------------+ Arterial wall calcification precludes accurate ankle pressures and ABIs. Right ABIs appear essentially unchanged compared to prior study on 08-09-2019. Left ABIs appear essentially  unchanged compared to prior study on 08-09-2019.  Summary: Right: Resting right ankle-brachial index indicates noncompressible right lower extremity arteries. Absent TBI. Left: Resting left ankle-brachial index indicates noncompressible left lower extremity arteries. Absent TBI.  *See table(s) above for measurements and observations.  Electronically signed by Jamelle Haring on 08/31/2020 at 1:09:33 PM.    Final    ECHOCARDIOGRAM COMPLETE  Result Date: 08/29/2020    ECHOCARDIOGRAM REPORT   Patient Name:   [name] Date of Exam: 08/29/2020 Medical Rec #:  [id number]       Height:       69.0 in Accession #:    [id number]  Weight:       190.0 lb Date of Birth:  08/29/1946      BSA:          2.022 m Patient Age:    89 years        BP:           148/62 mmHg Patient Gender: M               HR:           75 bpm. Exam Location:  Inpatient Procedure: 2D Echo, Cardiac Doppler and Color Doppler Indications:    I50.23 Acute on chronic systolic (congestive) heart failure  History:        Patient has prior history of Echocardiogram examinations, most                 recent 05/26/2020. Arrythmias:Atrial Fibrillation; Risk                 Factors:Hypertension, Diabetes and Dyslipidemia. CKD.  Sonographer:    Jonelle Sidle Dance Referring Phys: [id number] [place]  1. Left ventricular ejection fraction, by estimation, is 40 to 45%. The left ventricle has mildly decreased function. The left ventricle demonstrates regional wall motion abnormalities (see scoring diagram/findings for description). There is moderate left ventricular hypertrophy. Left ventricular diastolic parameters are consistent with Grade III diastolic dysfunction (restrictive).  2. Right ventricular systolic function is normal. The right ventricular size is normal.  3. Left atrial size was moderately dilated.  4. Right atrial size was mildly dilated.  5. Moderate pleural effusion.  6. The mitral valve is normal in structure. No evidence of mitral  valve regurgitation. No evidence of mitral stenosis. Moderate mitral annular calcification.  7. The aortic valve is normal in structure. Aortic valve regurgitation is mild. No aortic stenosis is present.  8. Aortic dilatation noted. There is mild dilatation at the level of the sinuses of Valsalva, measuring 43 mm. There is moderate dilatation of the ascending aorta, measuring 45 mm.  9. The inferior vena cava is normal in size with greater than 50% respiratory variability, suggesting right atrial pressure of 3 mmHg. Comparison(s): No significant change from prior study. Prior images reviewed side by side. FINDINGS  Left Ventricle: Left ventricular ejection fraction, by estimation, is 40 to 45%. The left ventricle has mildly decreased function. The left ventricle demonstrates regional wall motion abnormalities. The left ventricular internal cavity size was normal in size. There is moderate left ventricular hypertrophy. Left ventricular diastolic parameters are consistent with Grade III diastolic dysfunction (restrictive).  LV Wall Scoring: The inferior wall is akinetic. Right Ventricle: The right ventricular size is normal. No increase in right ventricular wall thickness. Right ventricular systolic function is normal. Left Atrium: Left atrial size was moderately dilated. Right Atrium: Right atrial size was mildly dilated. Pericardium: The pericardium was not assessed. Mitral Valve: The mitral valve is normal in structure. Moderate mitral annular calcification. No evidence of mitral valve regurgitation. No evidence of mitral valve stenosis. Tricuspid Valve: The tricuspid valve is normal in structure. Tricuspid valve regurgitation is not demonstrated. No evidence of tricuspid stenosis. Aortic Valve: The aortic valve is normal in structure. Aortic valve regurgitation is mild. Aortic regurgitation PHT measures 505 msec. No aortic stenosis is present. Pulmonic Valve: The pulmonic valve was normal in structure. Pulmonic  valve regurgitation is not visualized. No evidence of pulmonic stenosis. Aorta: Aortic dilatation noted. There is mild dilatation at the level of the sinuses of Valsalva, measuring 43 mm.  There is moderate dilatation of the ascending aorta, measuring 45 mm. Venous: The inferior vena cava is normal in size with greater than 50% respiratory variability, suggesting right atrial pressure of 3 mmHg. IAS/Shunts: No atrial level shunt detected by color flow Doppler. Additional Comments: There is a moderate pleural effusion.  LEFT VENTRICLE PLAX 2D LVIDd:         4.60 cm  Diastology LVIDs:         3.50 cm  LV e' medial:    7.83 cm/s LV PW:         1.40 cm  LV E/e' medial:  18.8 LV IVS:        1.20 cm  LV e' lateral:   10.10 cm/s LVOT diam:     2.40 cm  LV E/e' lateral: 14.6 LV SV:         69 LV SV Index:   34 LVOT Area:     4.52 cm  RIGHT VENTRICLE            IVC RV Basal diam:  2.80 cm    IVC diam: 2.70 cm RV S prime:     7.72 cm/s TAPSE (M-mode): 1.4 cm LEFT ATRIUM              Index       RIGHT ATRIUM           Index LA diam:        4.00 cm  1.98 cm/m  RA Area:     21.50 cm LA Vol (A2C):   107.0 ml 52.93 ml/m RA Volume:   71.80 ml  35.52 ml/m LA Vol (A4C):   76.2 ml  37.69 ml/m LA Biplane Vol: 97.1 ml  48.03 ml/m  AORTIC VALVE LVOT Vmax:   94.80 cm/s LVOT Vmean:  60.400 cm/s LVOT VTI:    0.152 m AI PHT:      505 msec  AORTA Ao Root diam: 4.30 cm Ao Asc diam:  4.50 cm MITRAL VALVE MV Area (PHT): 5.27 cm     SHUNTS MV Decel Time: 144 msec     Systemic VTI:  0.15 m MV E velocity: 147.00 cm/s  Systemic Diam: 2.40 cm MV A velocity: 48.00 cm/s MV E/A ratio:  3.06 Candee Furbish MD Electronically signed by Candee Furbish MD Signature Date/Time: 08/29/2020/4:02:57 PM    Final    VAS [place] UPPER EXTREMITY VENOUS DUPLEX  Result Date: 08/31/2020 UPPER VENOUS STUDY  Indications: Pain, and Swelling Comparison Study: no prior Performing Technologist: Abram Sander RVS  Examination Guidelines: A complete evaluation includes B-mode  imaging, spectral Doppler, color Doppler, and power Doppler as needed of all accessible portions of each vessel. Bilateral testing is considered an integral part of a complete examination. Limited examinations for reoccurring indications may be performed as noted.  Left Findings: +----------+------------+---------+-----------+----------+-----------------+  LEFT       Compressible Phasicity Spontaneous Properties      Summary       +----------+------------+---------+-----------+----------+-----------------+  IJV            Full        Yes        Yes                                   +----------+------------+---------+-----------+----------+-----------------+  Subclavian     Full        Yes        Yes                                   +----------+------------+---------+-----------+----------+-----------------+  Axillary       Full        Yes        Yes                                   +----------+------------+---------+-----------+----------+-----------------+  Brachial       Full        Yes        Yes                                   +----------+------------+---------+-----------+----------+-----------------+  Radial         Full                                                         +----------+------------+---------+-----------+----------+-----------------+  Ulnar          Full                                                         +----------+------------+---------+-----------+----------+-----------------+  Cephalic       None                                      Age Indeterminate  +----------+------------+---------+-----------+----------+-----------------+  Basilic        Full                                                         +----------+------------+---------+-----------+----------+-----------------+  Summary:  Left: No evidence of deep vein thrombosis in the upper extremity. Findings consistent with age indeterminate superficial vein thrombosis involving the left cephalic vein.  *See table(s) above  for measurements and observations.    Preliminary     Assessment and Plan:   1.  Acute combined systolic and diastolic CHF -09/6008 echo showed EF 55-60% and indeterminate left ventricular diastolic parameters -Current echo 40-45% wall motion abnormalities, inferior wall akinetic, and grade 3 diastolic dysfunction -It also says no significant change from prior study with images reviewed side-by-side -He needs diuresis, but Nephrology may prefer to manage this as he is on his second transplanted kidney and his creatinine is greater than 3, when they saw him on 12/11, they held the Lasix -MD review and advise if ischemic evaluation is needed, but this would not be scheduled until his general medical condition improves  2.  AKI on CKD IV -Dr. Carolin Sicks saw [name] on 12/11. -He discontinued the CellCept because of sepsis and infection and noted that his dose of prednisone is higher than usual because of treatment of his gout. -Dr. Carolin Sicks also held his Lasix  3. Hypotension: -[name] was hypertensive on admission with a systolic blood pressure as high as 180.  However, he was started on nitroglycerin among other medications and his  systolic dropped to 86. -His blood pressure has been low since then, dropping into the 90s upon occasion. -He has had intermittent Lasix, and is on Coreg 25 mg twice daily to help with heart rate control, home dose of Coreg was 6.25 mg twice daily -He is also on hydralazine 50 mg 3 times daily, but 2/3 doses held yesterday due to his blood pressure -Will go ahead and decrease the dose back to his home dose of 25 mg 3 times daily to try and get more doses on board  4.  Atrial flutter -His heart rate is generally well controlled on the higher dose of carvedilol. -He had problems with bleeding in the past and is on aspirin alone -Dr. Tamala Julian aware  Otherwise, per IM  Principal Problem:   Sepsis due to cellulitis [facility]) Active Problems:   Kidney transplant  status, cadaveric   Essential hypertension   Acute encephalopathy   Anemia due to chronic kidney disease   Diabetes mellitus type 2, insulin dependent (HCC)   Chronic kidney disease, stage 3b ([place])   Hypertensive emergency   Anemia, unspecified   Chronic renal failure, stage 3b ([place])   Renal transplant, status post   Diabetes mellitus type 2, controlled, with complications ([place])   Diabetic nephropathy ([place])   Pressure injury of skin   Malnutrition of moderate degree   Idiopathic chronic gout of multiple sites without tophus   Systolic and diastolic CHF, acute ([place])     For questions or updates, please contact [facility] Please consult [email or web address] for contact info under Cardiology/STEMI.   SignedRosaria Ferries, PA-C  09/01/2020 8:41 AM

## 2020-09-01 NOTE — TOC Progression Note (Addendum)
Transition of Care (TOC) - Progression Note    Patient Details  Name: [name] MRN: [id number] Date of Birth: 04/21/1946  Transition of Care (TOC) CM/SW Contact  ,  Lynn, RN Phone Number: 09/01/2020, 7:44 AM  Clinical Narrative:    Sepsis/osteomyelitis LEFT fifth metatarsal -Upon admission patient met criteria for sepsis temp > 38 C, RR> 20, point of infection cellulitis bilateral lower extremity and possible osteomyelitis of LEFT fifth metatarsal  -Continue current antibiotic treatment -Blood cultures pending -MRI LEFT foot; poor MRI secondary to motion artifact however infection, and erosions of metatarsals bone see report below. -Lactic acid/procalcitonin pending we will used to determine narrowing of antibiotic therapy. -12/9 D5-0.9% saline 100ml/hr -12/10 ABI bilateral lower extremity pending -12/10 after ABI obtained will contact Dr. Duda and discuss findings, is this because of sepsis and is surgery warranted? -12/11 discussed case with Dr. Duda orthopedic surgery who agreed to see patient..  ADDENDUM; received call from Dr. Duda, felt that all of patient's lower extremity pain secondary to his gout and no surgical intervention warranted.   Acute encephalopathy -Head CT negative and no focal neuro deficits identified -Not convinced patient encephalopathic.  Patient appears to be hard of hearing as well as a little bit slow however if you speak loudly and clearly and give him a few minutes to think he answers appropriately. -Tacrolimus level pending -RPR nonreactive -Ammonia WNL -TSH WNL -B12/WNL  -All sedating medication except for gabapentin. -12/12 patient's cognition significantly proved.  Renal transplant/CKD stage IIIb (baseline Cr ~1.85) -Renally dose all medications -Hold all renal toxic medication -12/11 DC CellCept 500 mg BID per nephrology recommendation -12/10 Increase Prednisone 50 mg daily for possible Gout Flare. (Normal dose 5 mg  daily)  -Tacrolimus 1 mg BID Recent Labs       Lab Results  Component Value Date   CREATININE 3.03 (H) 08/31/2020   CREATININE 2.62 (H) 08/30/2020   CREATININE 2.27 (H) 08/29/2020   CREATININE 2.37 (H) 08/28/2020   CREATININE 1.92 (H) 08/27/2020   CREATININE 2.09 (H) 08/27/2020    -12/11 DC D5-0.9% saline 100ml/hr -12/11 change to LR 50ml/hr per nephrology recommendation; believe patient fluid overloaded (held)  Anemia (baseline HgB 10.9) -Most likely secondary to CKD, anemia of chronic disease? -Anemia panel pending -Haptoglobin/LDH pending -Fecal occult blood pending  Hypertensive emergency -BP> 180 with chest pain, possible ischemia -Continue nitroglycerin drip -12/9 increase Coreg 25 mg BID  -12/9 Lasix 80 mg daily (hold) renal functioning deteriorating -12/9 increase Hydralazine 50 Mg TID -Strict ins and outs +6.8 L -Daily weight      Filed Weights   Plan undetermined sat this time may need snf placement due to foot injury and hx of renal transplant. Following for progression. Iv rocephin for cellulitis and iv ntg due to hypertensive emergency  BP now controlled..   Expected Discharge Plan: Home/Self Care Barriers to Discharge: No Barriers Identified  Expected Discharge Plan and Services Expected Discharge Plan: Home/Self Care   Discharge Planning Services: CM Consult   Living arrangements for the past 2 months: Single Family Home                                       Social Determinants of Health (SDOH) Interventions    Readmission Risk Interventions No flowsheet data found.  

## 2020-09-01 NOTE — Progress Notes (Addendum)
[name] NEPHROLOGY PROGRESS NOTE   CXR 12/12 - IMPRESSION: Small left pleural effusion.  Increased bibasilar opacities. Cardiomegaly and central pulmonary vascular congestion.     Home meds:   - hydralazine 25 tid/ lasix 80 qd/ coreg 6.25 bid  - prograf 1 bid/ cellcept 500 bid/ pred 5 qd  - percocet prn/ neurontin 100 bid prn  - lipotor 10 / asa 81  - zyloprim 100  - humalog ssi tid ac/ lantus 7-15 u per day  - prn's/ vitamins/ supplements  Physical Exam: General:NAD, comfortable Heart:RRR, s1s2 nl Lungs:clear b/l, no crackle Abdomen:soft, Non-tender, non-distended Extremities: Trace LE edema Neurology: Alert awake and following commands    Summary: Pt is a 74 y.o. yo male  with history of hypertension, HLD, type II DM, CHF, A. fib, ESRD status post kidney transplant in 1983, 2005, CKD 4 with baseline creatinine level around 2..0-2.2, admitted with left foot infection, osteomyelitis, seen as a consultation for  evaluation of AKI on CKD.  Problems: 1. AKI on CKD stage IV / renal transplant - AKI likely due to sepsis/hemodynamic changes related with hypertensive urgency on admission and then hypotension after NTG IV (SBP to 50s). Baseline creatinine 2.08-2.40.  CT scan with unremarkable transplant kidney. UA with proteinuria. Discontinued IV fluid, received a dose of Lasix 12/12.  Creat up 3.2 today. Sept admit was ~ 88kg. 83kg here on admit (pt was dry), now up to 89 kg.  CXR looks better than prior w/ vasc congestion resolved. Not looking wet on exam. Will hold lasix and IVF's , looks euvolemic. Hopefully the creatinine level will plateau in next few days. 2. History of kidney transplant x2 , last in 2005: Continue Prograf 1 mg twice a day. Will resume Cellcept tomorrow. Currently on higher dose of prednisone to treat gout flare.  Recommend to resume home transplant medication regimen on discharge.  3. Metabolic acidosis: Started sodium bicarbonate. 4. Acute  metabolic encephalopathy: CT scan with no acute finding however he does have advanced atherosclerosis. Minimize sedatives.  Mental status much better.  5. Possible gout flare: Uric acid level elevated. On po steroid. 6. Hypertension: Required nitroglycerin drip on admission. Continue carvedilol. Diuretics on hold. Monitor blood pressure.  Kelly Splinter, MD 09/01/2020, 2:14 PM    Subjective: UOP 600 cc y est.  Creat up slightly   Objective Vital signs in last 24 hours: Vitals:   09/01/20 0900 09/01/20 1000 09/01/20 1200 09/01/20 1400  BP: (!) 108/52 (!) 108/52 116/62 132/65  Pulse: 71 72 71 72  Resp: 18 (!) 21 20 19   Temp:      TempSrc:      SpO2: 100% 100% 100% 100%  Weight:      Height:       Weight change: 1.8 kg  Intake/Output Summary (Last 24 hours) at 09/01/2020 1414 Last data filed at 09/01/2020 1400 Gross per 24 hour  Intake 445.3 ml  Output 250 ml  Net 195.3 ml       Labs: Basic Metabolic Panel: Recent Labs  Lab 08/30/20 0221 08/31/20 0244 09/01/20 0237  NA 142 142 135  K 4.0 4.3 4.6  CL 115* 113* 107  CO2 17* 17* 18*  GLUCOSE 291* 319* 185*  BUN 51* 69* 92*  CREATININE 2.62* 3.03* 3.27*  CALCIUM 8.3* 8.7* 8.5*  PHOS 2.8 2.8 3.4  3.4   Liver Function Tests: Recent Labs  Lab 08/29/20 0243 08/30/20 0221 08/31/20 0244 09/01/20 0237  AST 24 20 22   --  ALT 10 9 9   --   ALKPHOS 80 67 65  --   BILITOT 0.6 0.8 0.6  --   PROT 5.6* 5.2* 5.3*  --   ALBUMIN 3.2* 2.9* 2.7* 2.6*   No results for input(s): LIPASE, AMYLASE in the last 168 hours. Recent Labs  Lab 08/26/20 2203  AMMONIA 31   CBC: Recent Labs  Lab 08/28/20 0244 08/29/20 0243 08/30/20 0221 08/31/20 0244 09/01/20 0237  WBC 7.5 6.8 8.6 11.1* 10.0  NEUTROABS 6.0 4.8 6.9 10.0* 9.4*  HGB 8.4* 9.8* 8.9* 8.2* 7.8*  HCT 28.5* 33.1* 30.5* 28.3* 27.0*  MCV 96.6 96.8 95.6 95.0 96.4  PLT 91* 116* 79* 76* 87*   Cardiac Enzymes: No results for input(s): CKTOTAL, CKMB, CKMBINDEX,  TROPONINI in the last 168 hours. CBG: Recent Labs  Lab 08/31/20 1222 08/31/20 1713 08/31/20 2123 09/01/20 0731 09/01/20 1200  GLUCAP 269* 215* 147* 194* 229*    Iron Studies: No results for input(s): IRON, TIBC, TRANSFERRIN, FERRITIN in the last 72 hours. Studies/Results: DG CHEST PORT 1 VIEW  Result Date: 08/31/2020 CLINICAL DATA:  Pleural effusion EXAM: PORTABLE CHEST 1 VIEW COMPARISON:  08/26/2020 and prior. FINDINGS: Cardiomegaly and central pulmonary vascular congestion. Patchy bibasilar opacities, more conspicuous on the left. No pneumothorax. Likely small left pleural effusion however the left costophrenic sulcus is partially imaged. IMPRESSION: Small left pleural effusion.  Increased bibasilar opacities. Cardiomegaly and central pulmonary vascular congestion. Electronically Signed   By: Primitivo Gauze M.D.   On: 08/31/2020 07:42   VAS [place] UPPER EXTREMITY VENOUS DUPLEX  Result Date: 08/31/2020 UPPER VENOUS STUDY  Indications: Pain, and Swelling Comparison Study: no prior Performing Technologist: Abram Sander RVS  Examination Guidelines: A complete evaluation includes B-mode imaging, spectral Doppler, color Doppler, and power Doppler as needed of all accessible portions of each vessel. Bilateral testing is considered an integral part of a complete examination. Limited examinations for reoccurring indications may be performed as noted.  Left Findings: +----------+------------+---------+-----------+----------+-----------------+ LEFT      CompressiblePhasicitySpontaneousProperties     Summary      +----------+------------+---------+-----------+----------+-----------------+ IJV           Full       Yes       Yes                                +----------+------------+---------+-----------+----------+-----------------+ Subclavian    Full       Yes       Yes                                +----------+------------+---------+-----------+----------+-----------------+  Axillary      Full       Yes       Yes                                +----------+------------+---------+-----------+----------+-----------------+ Brachial      Full       Yes       Yes                                +----------+------------+---------+-----------+----------+-----------------+ Radial        Full                                                    +----------+------------+---------+-----------+----------+-----------------+  Ulnar         Full                                                    +----------+------------+---------+-----------+----------+-----------------+ Cephalic      None                                  Age Indeterminate +----------+------------+---------+-----------+----------+-----------------+ Basilic       Full                                                    +----------+------------+---------+-----------+----------+-----------------+  Summary:  Left: No evidence of deep vein thrombosis in the upper extremity. Findings consistent with age indeterminate superficial vein thrombosis involving the left cephalic vein.  *See table(s) above for measurements and observations.    Preliminary     Medications: Infusions: . sodium chloride    . sodium chloride 75 mL/hr at 09/01/20 1400  . cefTRIAXone (ROCEPHIN)  IV Stopped (09/01/20 0032)  . nitroGLYCERIN Stopped (08/29/20 0846)    Scheduled Medications: . (feeding supplement) PROSource Plus  30 mL Oral BID BM  . allopurinol  100 mg Oral Daily  . aspirin EC  162 mg Oral BID  . atorvastatin  10 mg Oral Daily  . calcitRIOL  0.25 mcg Oral Daily  . carvedilol  25 mg Oral BID WC  . Chlorhexidine Gluconate Cloth  6 each Topical Daily  . heparin  5,000 Units Subcutaneous Q8H  . hydrALAZINE  50 mg Oral TID  . insulin aspart  0-5 Units Subcutaneous QHS  . insulin aspart  0-9 Units Subcutaneous TID WC  . insulin aspart  10 Units Subcutaneous TID WC  . insulin glargine  15 Units  Subcutaneous Daily  . latanoprost  1 drop Both Eyes QHS  . linagliptin  5 mg Oral Daily  . mouth rinse  15 mL Mouth Rinse BID  . multivitamin with minerals  1 tablet Oral Daily  . nutrition supplement (JUVEN)  1 packet Oral BID BM  . predniSONE  50 mg Oral Daily  . sodium bicarbonate  650 mg Oral TID  . sodium chloride flush  3 mL Intravenous Q12H  . sodium chloride flush  3 mL Intravenous Q12H  . tacrolimus  1 mg Oral BID    have reviewed scheduled and prn medications.

## 2020-09-02 DIAGNOSIS — L0390 Cellulitis, unspecified: Secondary | ICD-10-CM

## 2020-09-02 DIAGNOSIS — I5041 Acute combined systolic (congestive) and diastolic (congestive) heart failure: Secondary | ICD-10-CM

## 2020-09-02 DIAGNOSIS — N184 Chronic kidney disease, stage 4 (severe): Secondary | ICD-10-CM

## 2020-09-02 DIAGNOSIS — N179 Acute kidney failure, unspecified: Secondary | ICD-10-CM

## 2020-09-02 DIAGNOSIS — E440 Moderate protein-calorie malnutrition: Secondary | ICD-10-CM

## 2020-09-02 DIAGNOSIS — A419 Sepsis, unspecified organism: Principal | ICD-10-CM

## 2020-09-02 LAB — CBC WITH DIFFERENTIAL/PLATELET
Abs Immature Granulocytes: 0.04 10*3/uL (ref 0.00–0.07)
Basophils Absolute: 0 10*3/uL (ref 0.0–0.1)
Basophils Relative: 0 %
Eosinophils Absolute: 0 10*3/uL (ref 0.0–0.5)
Eosinophils Relative: 0 %
HCT: 28 % — ABNORMAL LOW (ref 39.0–52.0)
Hemoglobin: 8.2 g/dL — ABNORMAL LOW (ref 13.0–17.0)
Immature Granulocytes: 1 %
Lymphocytes Relative: 5 %
Lymphs Abs: 0.3 10*3/uL — ABNORMAL LOW (ref 0.7–4.0)
MCH: 27.5 pg (ref 26.0–34.0)
MCHC: 29.3 g/dL — ABNORMAL LOW (ref 30.0–36.0)
MCV: 94 fL (ref 80.0–100.0)
Monocytes Absolute: 0.2 10*3/uL (ref 0.1–1.0)
Monocytes Relative: 3 %
Neutro Abs: 6.3 10*3/uL (ref 1.7–7.7)
Neutrophils Relative %: 91 %
Platelets: 107 10*3/uL — ABNORMAL LOW (ref 150–400)
RBC: 2.98 MIL/uL — ABNORMAL LOW (ref 4.22–5.81)
RDW: 14.4 % (ref 11.5–15.5)
WBC: 6.9 10*3/uL (ref 4.0–10.5)
nRBC: 0 % (ref 0.0–0.2)

## 2020-09-02 LAB — BASIC METABOLIC PANEL
Anion gap: 12 (ref 5–15)
BUN: 87 mg/dL — ABNORMAL HIGH (ref 8–23)
CO2: 16 mmol/L — ABNORMAL LOW (ref 22–32)
Calcium: 8.5 mg/dL — ABNORMAL LOW (ref 8.9–10.3)
Chloride: 105 mmol/L (ref 98–111)
Creatinine, Ser: 3.4 mg/dL — ABNORMAL HIGH (ref 0.61–1.24)
GFR, Estimated: 18 mL/min — ABNORMAL LOW (ref >60)
Glucose, Bld: 253 mg/dL — ABNORMAL HIGH (ref 70–99)
Potassium: 5.2 mmol/L — ABNORMAL HIGH (ref 3.5–5.1)
Sodium: 133 mmol/L — ABNORMAL LOW (ref 135–145)

## 2020-09-02 LAB — RENAL FUNCTION PANEL
Albumin: 3.1 g/dL — ABNORMAL LOW (ref 3.5–5.0)
Anion gap: 12 (ref 5–15)
BUN: 113 mg/dL — ABNORMAL HIGH (ref 8–23)
CO2: 17 mmol/L — ABNORMAL LOW (ref 22–32)
Calcium: 9 mg/dL (ref 8.9–10.3)
Chloride: 108 mmol/L (ref 98–111)
Creatinine, Ser: 2.9 mg/dL — ABNORMAL HIGH (ref 0.61–1.24)
GFR, Estimated: 22 mL/min — ABNORMAL LOW (ref >60)
Glucose, Bld: 74 mg/dL (ref 70–99)
Phosphorus: 3.6 mg/dL (ref 2.5–4.6)
Potassium: 4.8 mmol/L (ref 3.5–5.1)
Sodium: 137 mmol/L (ref 135–145)

## 2020-09-02 LAB — PHOSPHORUS: Phosphorus: 3.6 mg/dL (ref 2.5–4.6)

## 2020-09-02 LAB — GLUCOSE, CAPILLARY
Glucose-Capillary: 243 mg/dL — ABNORMAL HIGH (ref 70–99)
Glucose-Capillary: 214 mg/dL — ABNORMAL HIGH (ref 70–99)
Glucose-Capillary: 254 mg/dL — ABNORMAL HIGH (ref 70–99)
Glucose-Capillary: 147 mg/dL — ABNORMAL HIGH (ref 70–99)
Glucose-Capillary: 67 mg/dL — ABNORMAL LOW (ref 70–99)
Glucose-Capillary: 73 mg/dL (ref 70–99)

## 2020-09-02 LAB — MAGNESIUM: Magnesium: 2.5 mg/dL — ABNORMAL HIGH (ref 1.7–2.4)

## 2020-09-02 MED ORDER — HEPARIN SODIUM (PORCINE) 1000 UNIT/ML DIALYSIS
1000.0000 [IU] | INTRAMUSCULAR | Status: DC | PRN
  Administered 2020-09-02: 17:00:00 5000 [IU] via INTRAVENOUS_CENTRAL
  Administered 2020-09-05: 09:00:00 3200 [IU] via INTRAVENOUS_CENTRAL
  Filled 2020-09-02 (×3): qty 6

## 2020-09-02 MED ORDER — INSULIN ASPART 100 UNIT/ML ~~LOC~~ SOLN
14.0000 [IU] | Freq: Three times a day (TID) | SUBCUTANEOUS | Status: DC
  Administered 2020-09-02 (×2): 14 [IU] via SUBCUTANEOUS

## 2020-09-02 MED ORDER — INSULIN GLARGINE 100 UNIT/ML ~~LOC~~ SOLN
26.0000 [IU] | Freq: Every day | SUBCUTANEOUS | Status: DC
  Administered 2020-09-02: 10:00:00 26 [IU] via SUBCUTANEOUS
  Filled 2020-09-02 (×2): qty 0.26

## 2020-09-02 MED ORDER — SODIUM CHLORIDE 0.9 % FOR CRRT: INTRAVENOUS_CENTRAL | Status: DC | PRN

## 2020-09-02 MED ORDER — SODIUM ZIRCONIUM CYCLOSILICATE 10 G PO PACK
10.0000 g | PACK | Freq: Two times a day (BID) | ORAL | Status: AC
  Administered 2020-09-02 (×2): 10 g via ORAL
  Filled 2020-09-02 (×2): qty 1

## 2020-09-02 MED ORDER — ALTEPLASE 2 MG IJ SOLR: 2.0000 mg | Freq: Once | INTRAMUSCULAR | Status: DC | PRN

## 2020-09-02 MED ORDER — PRISMASOL BGK 4/2.5 32-4-2.5 MEQ/L EC SOLN: Status: DC

## 2020-09-02 MED ORDER — PRISMASOL BGK 4/2.5 32-4-2.5 MEQ/L REPLACEMENT SOLN: Status: DC

## 2020-09-02 NOTE — Progress Notes (Signed)
VAST consulted to obtain IV access.  Pt's right arm restricted d/t old fistula. Velva Harman, pt's nurse verbalized pt will be getting a temporary dialysis catheter and she doesn't know plans for future dialysis access beyond that.  Due to being a renal patient, patient is not a candidate for PICC or midline without nephrology approval.  Pt's left arm assessed utilizing ultrasound. Vessels are small and tortuous. VAST RN attempted twice to place IV utilizing [place]; both attempts were successful getting into vessel, but unsuccessful in threading catheter and flushing successfully.  Rita informed. Recommended contacting physician for central line or alternative access.

## 2020-09-02 NOTE — Procedures (Signed)
Central Venous Catheter Insertion Procedure Note  [name]  [id number]  [birth year]  Date:09/02/20  Time:4:58 PM   Provider Performing:Brent Skylie Hiott   Procedure: Insertion of Non-tunneled Central Venous Catheter(36556)with US guidance (54492)    Indication(s) Hemodialysis  Consent Risks of the procedure as well as the alternatives and risks of each were explained to the patient and/or caregiver.  Consent for the procedure was obtained and is signed in the bedside chart  Anesthesia Topical only with 1% lidocaine   Timeout Verified patient identification, verified procedure, site/side was marked, verified correct patient position, special equipment/implants available, medications/allergies/relevant history reviewed, required imaging and test results available.  Sterile Technique Maximal sterile technique including full sterile barrier drape, hand hygiene, sterile gown, sterile gloves, mask, hair covering, sterile ultrasound probe cover (if used).  Procedure Description Area of catheter insertion was cleaned with chlorhexidine and draped in sterile fashion.   With real-time ultrasound guidance a HD catheter was placed into the right femoral vein.  Nonpulsatile blood flow and easy flushing noted in all ports.  The catheter was sutured in place and sterile dressing applied.  Complications/Tolerance None; patient tolerated the procedure well. Chest X-ray is ordered to verify placement for internal jugular or subclavian cannulation.  Chest x-ray is not ordered for femoral cannulation.  EBL Minimal  Specimen(s) None  Roselie Awkward, MD [place] [facility] Pager: [phone] Cell: [phone] If no response, call [phone]

## 2020-09-02 NOTE — TOC Progression Note (Signed)
Transition of Care [facility]) - Progression Note    Patient Details  Name: [name] MRN: [id number] Date of Birth: 01-07-1946  Transition of Care [facility]) CM/SW Contact  Leeroy Cha, RN Phone Number: 09/02/2020, 7:25 AM  Clinical Narrative:     74 y.o. yo male  with history of hypertension, HLD, type II DM, CHF, A. fib, ESRD status post kidney transplant in 1983, 2005, CKD 4 with baseline creatinine level around 2..0-2.2, admitted with left foot infection, osteomyelitis, seen as a consultation for  evaluation of AKI on CKD. bp-86/43 to 143/79,  Iv rocephin and iv ntg for a.fib   Expected Discharge Plan: Home/Self Care Barriers to Discharge: No Barriers Identified  Expected Discharge Plan and Services Expected Discharge Plan: Home/Self Care   Discharge Planning Services: CM Consult   Living arrangements for the past 2 months: Single Family Home                                       Social Determinants of Health (SDOH) Interventions    Readmission Risk Interventions No flowsheet data found.

## 2020-09-02 NOTE — Progress Notes (Signed)
PROGRESS NOTE    [name]  BOF:[id number] DOB: [birth year] DOA: 08/26/2020 PCP: [name], MD     Brief Narrative:   [name] is a 74 y.o. 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  Patient had reportedly been complaining of some left foot pain and nausea last night but otherwise seem to be in his usual state when his wife left the house today but she was later called by a home health worker who reported that the patient was confused.  He was able to open the door for the home health agent but unable to answer any of her basic questions.  Patient is unable to contribute to the history.   ED Course: Upon arrival to the ED, patient is found to be febrile to 39.3 C, saturating 98% on 2 L/min of supplemental oxygen, and hypertensive with systolic pressure as high as 237.  EKG features sinus tachycardia with rate 105.  Noncontrast head CT negative for acute intracranial abnormality.  Chest x-ray notable for cardiomegaly with mild diffuse interstitial edema.  Radiographs of the left foot demonstrate soft tissue swelling about the left fifth MTP without acute underlying bony abnormality.  No acute findings on CT of the abdomen and pelvis.  Chemistry panel features a glucose of 232, bicarbonate 17, and creatinine 1.85.  CBC notable for mild leukocytosis and thrombocytopenia, and a improved normocytic anemia.  Lactic acid is 1.9.  BNP 532.  Blood and urine cultures were collected in the emergency department patient was given 500 cc of saline, 40 mg IV Lasix, vancomycin, cefepime, and Flagyl.  He was started on nitroglycerin infusion for blood pressure.  COVID-19 pcr is negative.   Subjective: 12/14 afebrile overnight A/O x4, patient sitting in chair eating breakfast comfortably.  Patient's  encephalopathy significantly improved.  Recalls seeing nephrologist and cardiologist.   Assessment & Plan: Covid vaccination; vaccinated   Principal Problem:   Sepsis due to cellulitis [facility]) Active Problems:   Kidney transplant status, cadaveric   Essential hypertension   Acute encephalopathy   Anemia due to chronic kidney disease   Diabetes mellitus type 2, insulin dependent ([place])   Chronic kidney disease, stage 3b ([place])   Hypertensive emergency   Anemia, unspecified   Chronic renal failure, stage 3b ([place])   Renal transplant, status post   Diabetes mellitus type 2, controlled, with complications ([place])   Diabetic nephropathy ([place])   Pressure injury of skin   Malnutrition of moderate degree   Idiopathic chronic gout of multiple sites without tophus   Systolic and diastolic CHF, acute (HCC)  Sepsis/osteomyelitis LEFT fifth metatarsal -Upon admission patient met criteria for sepsis temp > 38 C, RR> 20, point of infection cellulitis bilateral lower extremity and possible osteomyelitis of LEFT fifth metatarsal  -Continue current antibiotic treatment -Blood cultures pending -MRI LEFT foot; poor MRI secondary to motion artifact however infection, and erosions of metatarsals bone see report below. -Lactic acid/procalcitonin pending we will used to determine narrowing of antibiotic therapy. -12/9 D5-0.9% saline 185m/hr (held) secondary to fluid overload -12/10 ABI bilateral lower extremity pending -12/10 after ABI obtained will contact Dr. DSharol Givenand discuss findings, is this because of sepsis and is surgery warranted? -12/11 discussed case with Dr. DSharol Givenorthopedic surgery who agreed to see patient..[name] received call from Dr. DSharol Given felt that  all of patient's lower extremity pain secondary to his gout and no surgical intervention warranted.   Acute encephalopathy  -Head CT negative and no focal neuro deficits identified  -Not convinced patient encephalopathic.  Patient appears  to be hard of hearing as well as a little bit slow however if you speak loudly and clearly and give him a few minutes to think he answers appropriately. -Tacrolimus level pending -RPR nonreactive -Ammonia WNL -TSH WNL -B12/WNL  -All sedating medication except for gabapentin. -12/12 patient's cognition significantly proved. -12/14 patient starting to have asterixis and short-term memory loss worsening.  After discussing case with nephrology afraid patient may becoming uremic. -12/14 nephrology has requested placement of Vas-Cath in order to start CRRT.  Vas-Cath will not be placed until 12/15.  Renal transplant/CKD stage IIIb (baseline Cr ~1.85) -Renally dose all medications -Hold all renal toxic medication -12/11 DC CellCept 500 mg BID per nephrology recommendation -12/10 Increase Prednisone 50 mg daily for possible Gout Flare. (Normal dose 5 mg daily)  -Tacrolimus 1 mg BID Lab Results  Component Value Date   CREATININE 3.40 (H) 09/02/2020   CREATININE 3.27 (H) 09/01/2020   CREATININE 3.03 (H) 08/31/2020   CREATININE 2.62 (H) 08/30/2020   CREATININE 2.27 (H) 08/29/2020  -12/11 DC D5-0.9% saline 169ml/hr -12/11 change to LR 15ml/hr per nephrology recommendation; believe patient fluid overloaded (held) -12/13 per nephrology continue to hold diuretics and IV fluids.  Anemia chronic disease (baseline HgB 10.9) -Most likely secondary to CKD, anemia of chronic disease? -Anemia panel; consistent with anemia of chronic disease -LDH WNL -Fecal occult blood pending  Hypertensive emergency -BP> 180 with chest pain, possible ischemia -Continue nitroglycerin drip -12/9 Lasix 80 mg daily (hold) renal functioning deteriorating -12/13 decrease Coreg 12.5 mg BID (believes secondary to patient's worsening renal function obtaining a buildup) -12/13 decrease Hydralazine increase 25 mg TID (buildup secondary to worsening renal function) -Strict ins and outs +7.2 L -Daily weight Filed Weights    09/01/20 0459 09/01/20 1400 09/02/20 0500  Weight: 91.5 kg 89.2 kg 93.4 kg  -12/12 Lasix IV 60 mg x 1.  Believe patient fluid overloaded  Acute Systolic and Diastolic CHF -Patient asked uncontrolled HTN DC Solu-Cortef -September 2021 EF 55 to 60% -See HTN emergency -12/10 echocardiogram LVEF= 40 to 45%, with diastolic dysfunction see results below -12/12 with worsening cardiac function will consult cardiology.  Appears last saw Dr. Verdis Prime 05/07/2019 -12/12 spoke with Dr. Andreas Ohm cardiology agreed to see patient will await further recommendations -12/13 per cardiology advise if ischemic evaluation is needed, but this would not be scheduled until his general medical condition improves.  Agree patient fluid and require diuresis however also secondary to his current renal function should be managed by nephrology.  Chest pain -See hypertensive emergency  Hypotension --12/10 Albumin 25g  X 1   Pleural effusion -Moderate pleural effusion seen on echocardiogram. -12/12 PCXR; small effusion and pulmonary congestion see results below  DM type II controlled with complication/DM nephropathy -12/8 hemoglobin A1c= 6.6 -12/14 increase Lantus 26 units daily -12/14 increase NovoLog 14 units qac  -Sensitive SSI -Tradjenta 5 mg daily  Gout -12/9 Uric acid = 10.7  -When patient's renal function begins to improve we will start colchicine -Other than his LEFT FIFTH METATARSAL NO JOINT PAIN. -Patient on prednisone for his renal transplant; may be a gout flare,See Renal Transplant .  Hypomagnesmia -Magnesium goal>2  Hyperkalemic -5/14 Lokelma 10 g x 2 dose  Bilateral arm pain chronic -Although chronic since patient not  able to adequately describe if it is only in the joints will obtain upper extremity Doppler ultrasound; negative for DVT see results below  Goals of care -12/12 PT/OT consult; evaluate patient for placement to SNF VS CIR.  Multisystem organ failure   DVT  prophylaxis: Heparin subcu Code Status: Full Family Communication:  Status is: Inpatient    Dispo: The patient is from: Home              Anticipated d/c is to: Home              Anticipated d/c date is: 12/12              Patient currently unstable      Consultants:  Dr. Ottie Glazier cardiology Dr. Sharol Given orthopedic surgery Nephrology    Procedures/Significant Events:  12/7 PCXR;Cardiomegaly and mild, diffuse interstitial pulmonary opacity, likely edema. No focal airspace opacity 12/7 CT abdomen pelvis W. Wo contrast;-Small bibasilar pleural effusions and compressive atelectasis in the RIGHT lower lobe. -Atrophic native kidneys with old calcified renal transplant in RIGHT iliac fossa. -Transplant kidney LEFT iliac fossa without mass or hydronephrosis. -Scattered colonic diverticulosis without evidence of diverticulitis. -No definite acute intra-abdominal or intrapelvic abnormalities. 12/10 MRI LEFT foot W0 contrast;-prominent erosions and irregularity along the articular surfaces of the first MTP joint and in the distal half of the proximal phalanx of the great toe, and in the base of the distal phalanx fourth toe.  -The phalanges of the fifth toe are somewhat obscured by motion artifact, but I not see clear evidence of osteomyelitis. -Small erosion of the base of the distal phalanx fourth toe. -Circumferential edema in the forefoot, especially dorsally,cellulitis is not excluded. No drainable fluid collection is identified. -Generalized muscular atrophy in the forefoot. Low-grade edema is likely neurogenic. 12/11 bilateral lower extremity ABIs;Right:-Resting right ankle-brachial index indicates noncompressible right lower extremity arteries.  Absent TBI.  Left: Resting left ankle-brachial index indicates noncompressible left lower extremity arteries.  12/10 Echocardiogram;LVEF=40 to 45%. Left ventricle demonstrates regional wall motion abnormalities.  -Moderate LVH -Grade  III diastolic dysfunction   -The inferior wall is akinetic.  Left Atrium: moderately dilated.  Aorta: moderate dilatation of the ascending aorta, measuring 45 mm.  -There is a moderate pleural effusion.  12/11 PCXR;-small left pleural effusion. Increased bibasilar opacities. -Cardiomegaly and central pulmonary vascular congestion.    I have personally reviewed and interpreted all radiology studies and my findings are as above.  VENTILATOR SETTINGS:    Cultures 12/7 blood LEFT hand negative final  12/7 RIGHT arm negative final   12/7 urine insignificant growth 12/8 MRSA by PCR negative    Antimicrobials: Anti-infectives (From admission, onward)   Start     Ordered Stop   08/26/20 2359  cefTRIAXone (ROCEPHIN) 1 g in sodium chloride 0.9 % 100 mL IVPB        08/26/20 2028     08/26/20 1545  ceFEPIme (MAXIPIME) 2 g in sodium chloride 0.9 % 100 mL IVPB        08/26/20 1532 08/26/20 1631   08/26/20 1545  metroNIDAZOLE (FLAGYL) tablet 500 mg  Status:  Discontinued        08/26/20 1532 08/26/20 2015   08/26/20 1545  vancomycin (VANCOCIN) IVPB 1000 mg/200 mL premix        08/26/20 1532 08/26/20 1823       Devices    LINES / TUBES:      Continuous Infusions: . sodium chloride    . sodium chloride  50 mL/hr at 09/01/20 2250  . cefTRIAXone (ROCEPHIN)  IV Stopped (09/02/20 0141)  . nitroGLYCERIN Stopped (08/29/20 0846)     Objective: Vitals:   09/02/20 0000 09/02/20 0200 09/02/20 0419 09/02/20 0500  BP: 137/65 (!) 143/66    Pulse: 66 (!) 50    Resp: (!) 23 18    Temp:   97.8 F (36.6 C)   TempSrc:   Axillary   SpO2: 93% 100%    Weight:    93.4 kg  Height:        Intake/Output Summary (Last 24 hours) at 09/02/2020 0809 Last data filed at 09/02/2020 0500 Gross per 24 hour  Intake 1033.31 ml  Output 600 ml  Net 433.31 ml   Filed Weights   09/01/20 0459 09/01/20 1400 09/02/20 0500  Weight: 91.5 kg 89.2 kg 93.4 kg    Physical Exam:  General: A/O x4, No  acute respiratory distress Eyes: negative scleral hemorrhage, negative anisocoria, negative icterus ENT: Negative Runny nose, negative gingival bleeding, Neck:  Negative scars, masses, torticollis, lymphadenopathy, JVD Lungs: Clear to auscultation bilaterally without wheezes or crackles Cardiovascular: Regular rate and rhythm without murmur gallop or rub normal S1 and S2 Abdomen: negative abdominal pain, nondistended, positive soft, bowel sounds, no rebound, no ascites, no appreciable mass Extremities: continued pain to palpation in bilateral upper and lower extremities however significantly decreased from 12/12.  Pain worse in L LE ankle and foot Skin: Multiple bilateral lower extremity laceration/lesions.  Erythema decreased, pain to palpation decreased Psychiatric:  Negative depression, negative anxiety, negative fatigue, negative mania  Central nervous system:  Cranial nerves II through XII intact, tongue/uvula midline, all extremities muscle strength 5/5, sensation intact throughout, negative dysarthria, negative expressive aphasia, negative receptive aphasia. .     Data Reviewed: Care during the described time interval was provided by me .  I have reviewed this patient's available data, including medical history, events of note, physical examination, and all test results as part of my evaluation.  CBC: Recent Labs  Lab 08/29/20 0243 08/30/20 0221 08/31/20 0244 09/01/20 0237 09/02/20 0220  WBC 6.8 8.6 11.1* 10.0 6.9  NEUTROABS 4.8 6.9 10.0* 9.4* 6.3  HGB 9.8* 8.9* 8.2* 7.8* 8.2*  HCT 33.1* 30.5* 28.3* 27.0* 28.0*  MCV 96.8 95.6 95.0 96.4 94.0  PLT 116* 79* 76* 87* 702*   Basic Metabolic Panel: Recent Labs  Lab 08/29/20 0243 08/30/20 0221 08/31/20 0244 09/01/20 0237 09/02/20 0220  NA 142 142 142 135 133*  K 4.0 4.0 4.3 4.6 5.2*  CL 112* 115* 113* 107 105  CO2 18* 17* 17* 18* 16*  GLUCOSE 205* 291* 319* 185* 253*  BUN 49* 51* 69* 92* 87*  CREATININE 2.27* 2.62* 3.03*  3.27* 3.40*  CALCIUM 9.1 8.3* 8.7* 8.5* 8.5*  MG 1.7 2.0 2.1 2.2 2.5*  PHOS 2.1* 2.8 2.8 3.4  3.4 3.6   GFR: Estimated Creatinine Clearance: 21.5 mL/min (A) (by C-G formula based on SCr of 3.4 mg/dL (H)). Liver Function Tests: Recent Labs  Lab 08/27/20 1227 08/28/20 0244 08/29/20 0243 08/30/20 0221 08/31/20 0244 09/01/20 0237  AST 25 23 24 20 22   --   ALT 10 9 10 9 9   --   ALKPHOS 83 61 80 67 65  --   BILITOT 1.1 1.0 0.6 0.8 0.6  --   PROT 5.9* 5.0* 5.6* 5.2* 5.3*  --   ALBUMIN 3.4* 2.9* 3.2* 2.9* 2.7* 2.6*   No results for input(s): LIPASE, AMYLASE in the last 168 hours. Recent  Labs  Lab 08/26/20 2203  AMMONIA 31   Coagulation Profile: Recent Labs  Lab 08/26/20 1530 08/27/20 1654  INR 1.3* 1.3*   Cardiac Enzymes: No results for input(s): CKTOTAL, CKMB, CKMBINDEX, TROPONINI in the last 168 hours. BNP (last 3 results) No results for input(s): PROBNP in the last 8760 hours. HbA1C: No results for input(s): HGBA1C in the last 72 hours. CBG: Recent Labs  Lab 09/01/20 1200 09/01/20 1549 09/01/20 1935 09/01/20 2136 09/02/20 0720  GLUCAP 229* 215* 240* 231* 214*   Lipid Profile: No results for input(s): CHOL, HDL, LDLCALC, TRIG, CHOLHDL, LDLDIRECT in the last 72 hours. Thyroid Function Tests: No results for input(s): TSH, T4TOTAL, FREET4, T3FREE, THYROIDAB in the last 72 hours. Anemia Panel: No results for input(s): VITAMINB12, FOLATE, FERRITIN, TIBC, IRON, RETICCTPCT in the last 72 hours. Sepsis Labs: Recent Labs  Lab 08/27/20 1616 08/27/20 1648 08/27/20 1940 08/28/20 0244 08/28/20 0944 08/28/20 1241 08/29/20 0243  PROCALCITON 2.60  --   --  2.88  --   --  2.17  LATICACIDVEN  --  1.7 2.1*  --  0.8 1.2  --     Recent Results (from the past 240 hour(s))  Blood Culture (routine x 2)     Status: None   Collection Time: 08/26/20  3:30 PM   Specimen: BLOOD LEFT HAND  Result Value Ref Range Status   Specimen Description   Final    BLOOD LEFT  HAND Performed at [facility] [address], [place], [place] [address]    Special Requests   Final    BOTTLES DRAWN AEROBIC AND ANAEROBIC Blood Culture adequate volume Performed at [facility] [address] [place], [place] [address]    Culture   Final    NO GROWTH 5 DAYS Performed at [facility], [place] [address] [place], [place] [address]    Report Status 08/31/2020 FINAL  Final  Blood Culture (routine x 2)     Status: None   Collection Time: 08/26/20  3:30 PM   Specimen: BLOOD  Result Value Ref Range Status   Specimen Description   Final    BLOOD RIGHT ARM Performed at [facility] [address] [place], [place] [address]    Special Requests   Final    BOTTLES DRAWN AEROBIC AND ANAEROBIC Blood Culture results may not be optimal due to an inadequate volume of blood received in culture bottles Performed at [facility] [address] [place], [place] [address]    Culture   Final    NO GROWTH 5 DAYS Performed at [facility], [place] [address], [place], [place] [address]    Report Status 08/31/2020 FINAL  Final  Urine culture     Status: Abnormal   Collection Time: 08/26/20  3:33 PM   Specimen: In/Out Cath Urine  Result Value Ref Range Status   Specimen Description   Final    IN/OUT CATH URINE Performed at [facility] [address] [place],  [address]    Special Requests   Final    NONE Performed at [facility], [place] [address], [place],  [address]    Culture (A)  Final    <10,000 COLONIES/mL INSIGNIFICANT GROWTH Performed at [facility] [address], [place],  [address]    Report Status 08/28/2020 FINAL  Final  Resp Panel by RT-PCR (Flu A&B, Covid) In/Out Cath Urine  Status: None   Collection Time: 08/26/20  4:30 PM   Specimen: In/Out Cath Urine; Nasopharyngeal(NP) swabs in vial transport medium   Result Value Ref Range Status   SARS Coronavirus 2 by RT PCR NEGATIVE NEGATIVE Final    Comment: (NOTE) SARS-CoV-2 target nucleic acids are NOT DETECTED.  The SARS-CoV-2 RNA is generally detectable in upper respiratory specimens during the acute phase of infection. The lowest concentration of SARS-CoV-2 viral copies this assay can detect is 138 copies/mL. A negative result does not preclude SARS-Cov-2 infection and should not be used as the sole basis for treatment or other patient management decisions. A negative result may occur with  improper specimen collection/handling, submission of specimen other than nasopharyngeal swab, presence of viral mutation(s) within the areas targeted by this assay, and inadequate number of viral copies(<138 copies/mL). A negative result must be combined with clinical observations, patient history, and epidemiological information. The expected result is Negative.  Fact Sheet for Patients:  EntrepreneurPulse.com.au  Fact Sheet for Healthcare Providers:  IncredibleEmployment.be  This test is no t yet approved or cleared by the [place] FDA and  has been authorized for detection and/or diagnosis of SARS-CoV-2 by FDA under an Emergency Use Authorization (EUA). This EUA will remain  in effect (meaning this test can be used) for the duration of the COVID-19 declaration under Section 564(b)(1) of the Act, 21 U.S.C.section 360bbb-3(b)(1), unless the authorization is terminated  or revoked sooner.       Influenza A by PCR NEGATIVE NEGATIVE Final   Influenza B by PCR NEGATIVE NEGATIVE Final    Comment: (NOTE) The Xpert Xpress SARS-CoV-2/FLU/RSV plus assay is intended as an aid in the diagnosis of influenza from Nasopharyngeal swab specimens and should not be used as a sole basis for treatment. Nasal washings and aspirates are unacceptable for Xpert Xpress SARS-CoV-2/FLU/RSV testing.  Fact Sheet for  Patients: EntrepreneurPulse.com.au  Fact Sheet for Healthcare Providers: IncredibleEmployment.be  This test is not yet approved or cleared by the [place] FDA and has been authorized for detection and/or diagnosis of SARS-CoV-2 by FDA under an Emergency Use Authorization (EUA). This EUA will remain in effect (meaning this test can be used) for the duration of the COVID-19 declaration under Section 564(b)(1) of the Act, 21 U.S.C. section 360bbb-3(b)(1), unless the authorization is terminated or revoked.  Performed at [facility], [place] [address], [place], [place] [address]   MRSA PCR Screening     Status: None   Collection Time: 08/27/20 12:22 PM   Specimen: Nasal Mucosa; Nasopharyngeal  Result Value Ref Range Status   MRSA by PCR NEGATIVE NEGATIVE Final    Comment:        The GeneXpert MRSA Assay (FDA approved for NASAL specimens only), is one component of a comprehensive MRSA colonization surveillance program. It is not intended to diagnose MRSA infection nor to guide or monitor treatment for MRSA infections. Performed at [facility], [place] [address], [place], [place] [address]          Radiology Studies: VAS [place] UPPER EXTREMITY VENOUS DUPLEX  Result Date: 09/01/2020 UPPER VENOUS STUDY  Indications: Pain, and Swelling Comparison Study: no prior Performing Technologist: Abram Sander RVS  Examination Guidelines: A complete evaluation includes B-mode imaging, spectral Doppler, color Doppler, and power Doppler as needed of all accessible portions of each vessel. Bilateral testing is considered an integral part of a complete examination. Limited examinations for reoccurring indications may be performed as noted.  Left Findings: +----------+------------+---------+-----------+----------+-----------------+  LEFT      CompressiblePhasicitySpontaneousProperties     Summary       +----------+------------+---------+-----------+----------+-----------------+ IJV           Full       Yes       Yes                                +----------+------------+---------+-----------+----------+-----------------+ Subclavian    Full       Yes       Yes                                +----------+------------+---------+-----------+----------+-----------------+ Axillary      Full       Yes       Yes                                +----------+------------+---------+-----------+----------+-----------------+ Brachial      Full       Yes       Yes                                +----------+------------+---------+-----------+----------+-----------------+ Radial        Full                                                    +----------+------------+---------+-----------+----------+-----------------+ Ulnar         Full                                                    +----------+------------+---------+-----------+----------+-----------------+ Cephalic      None                                  Age Indeterminate +----------+------------+---------+-----------+----------+-----------------+ Basilic       Full                                                    +----------+------------+---------+-----------+----------+-----------------+  Summary:  Left: No evidence of deep vein thrombosis in the upper extremity. Findings consistent with age indeterminate superficial vein thrombosis involving the left cephalic vein.  *See table(s) above for measurements and observations.  Diagnosing physician: Ruta Hinds MD Electronically signed by Ruta Hinds MD on 09/01/2020 at 7:42:40 PM.    Final         Scheduled Meds: . (feeding supplement) PROSource Plus  30 mL Oral BID BM  . allopurinol  100 mg Oral Daily  . aspirin EC  162 mg Oral BID  . atorvastatin  10 mg Oral Daily  . calcitRIOL  0.25 mcg Oral Daily  . carvedilol  12.5 mg Oral BID WC  . Chlorhexidine  Gluconate Cloth  6 each Topical Daily  . heparin  5,000 Units Subcutaneous Q8H  .  hydrALAZINE  25 mg Oral TID  . insulin aspart  0-5 Units Subcutaneous QHS  . insulin aspart  0-9 Units Subcutaneous TID WC  . insulin aspart  10 Units Subcutaneous TID WC  . insulin glargine  20 Units Subcutaneous Daily  . latanoprost  1 drop Both Eyes QHS  . linagliptin  5 mg Oral Daily  . mouth rinse  15 mL Mouth Rinse BID  . multivitamin with minerals  1 tablet Oral Daily  . mycophenolate  500 mg Oral BID  . nutrition supplement (JUVEN)  1 packet Oral BID BM  . predniSONE  50 mg Oral Daily  . sodium bicarbonate  650 mg Oral TID  . sodium chloride flush  3 mL Intravenous Q12H  . sodium chloride flush  3 mL Intravenous Q12H  . tacrolimus  1 mg Oral BID   Continuous Infusions: . sodium chloride    . sodium chloride 50 mL/hr at 09/01/20 2250  . cefTRIAXone (ROCEPHIN)  IV Stopped (09/02/20 0141)  . nitroGLYCERIN Stopped (08/29/20 0846)     LOS: 7 days    Time spent:40 min    Rudolfo Brandow, Geraldo Docker, MD [facility] Pager [phone]  If 7PM-7AM, please contact night-coverage [email or web address] Password [email or web address] 09/02/2020, 8:09 AM

## 2020-09-02 NOTE — Evaluation (Signed)
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   Clinical Impression   Per chart review from previous admissions patient lives with spouse in single level home. Patient is currently confused, disoriented to place, situation and is tangential therefore difficult to discern if coming from home vs SNF rehab as patient states "they wouldn't let me out of bed." When asked to elaborate patient states [name] "you guys." Patient with poor safety awareness, high fall risk requiring mod A x2 with rolling walker for transfer to recliner with R bias. Recommend continued acute OT services to maximize patient safety and independence with self care in order to facilitate D/C to venue listed below.    Follow Up Recommendations  SNF    Equipment Recommendations  3 in 1 bedside commode       Precautions / Restrictions Precautions Precautions: Fall Restrictions Weight Bearing Restrictions: No      Mobility Bed Mobility Overal bed mobility: Needs Assistance Bed Mobility: Supine to Sit     Supine to sit: Min assist;HOB elevated     General bed mobility comments: increased time and assist with trunk to sit upright    Transfers Overall transfer level: Needs assistance Equipment used: Rolling walker (2 wheeled) Transfers: Sit to/from [facility] Sit to Stand: Mod assist;+2 physical assistance;+2 safety/equipment Stand pivot transfers: Mod assist;+2 physical assistance;+2 safety/equipment       General transfer comment: please see toilet transfer in ADL  section; high fall risk    Balance Overall balance assessment: Needs assistance Sitting-balance support: Feet supported Sitting balance-Leahy Scale: Fair   Postural control: Right lateral lean Standing balance support: Bilateral upper extremity supported Standing balance-Leahy Scale: Poor Standing balance comment: relies on external assist and support from OT                           ADL either performed or assessed with clinical judgement   ADL Overall ADL's : Needs assistance/impaired Eating/Feeding: Set up;Sitting   Grooming: Set up;Sitting   Upper Body Bathing: Minimal assistance;Sitting   Lower Body Bathing: Moderate assistance;Sitting/lateral leans;Sit to/from stand Lower Body Bathing Details (indicate cue type and reason): patient washes thighs and peri area after condom catheter leaked Upper Body Dressing : Minimal assistance;Sitting Upper Body Dressing Details (indicate cue type and reason): required mod cues to thread correct UE into clean gown Lower Body Dressing: Total assistance Lower Body Dressing Details (indicate cue type and reason): don socks Toilet Transfer: Moderate assistance;+2 for physical assistance;+2 for safety/equipment;Stand-pivot;Cueing for safety;Cueing for sequencing;BSC;RW Toilet Transfer Details (indicate cue type and reason): R bias, difficulty advancing LEs taking small shuffled steps, poor eccentric control into chair and attempts sitting before lined up with chair. Toileting- Clothing Manipulation and Hygiene: Total assistance;Sit to/from stand Toileting - Clothing Manipulation Details (indicate cue type and reason): to wash buttock, poor standing balance and safety     Functional mobility during ADLs: Moderate assistance;+2 for physical assistance;+2 for safety/equipment;Cueing for safety;Cueing for sequencing;Rolling walker General ADL Comments: patient requiring increased assistance with self care due to poor safety awareness,  cognition, strength, balance  Vision   Additional Comments: pt appears to have some visual deficits, tries drinking coffee with lid still on, reaches for opposite side of walker when attempting to stand            Pertinent Vitals/Pain Pain Assessment: Faces Faces Pain Scale: Hurts little more Pain Location: R arm when touched Pain Descriptors / Indicators: Sore;Guarding Pain Intervention(s): Monitored during session     Hand Dominance Right   Extremity/Trunk Assessment Upper Extremity Assessment Upper Extremity Assessment: Generalized weakness   Lower Extremity Assessment Lower Extremity Assessment: Defer to PT evaluation       Communication Communication Communication: No difficulties   Cognition Arousal/Alertness: Awake/alert Behavior During Therapy: Restless Overall Cognitive Status: No family/caregiver present to determine baseline cognitive functioning                                 General Comments: patient disoriented to place after being told at [facility] state he is at his DTRs house ~2 mins later, keeps looking for DTR "she's right there" pointing to RN in hallway. Unable to gather history if patient coming from home vs SNF              Home Living Family/patient expects to be discharged to:: Private residence Living Arrangements: Spouse/significant other Available Help at Discharge: Family Type of Home: House Home Access: Stairs to enter     Home Layout: One level     Bathroom Shower/Tub: Teacher, early years/pre: [place]: Environmental consultant - 2 wheels   Additional Comments: info from prev admission, pt poor historian      Prior Functioning/Environment Level of Independence: Needs assistance;Independent  Gait / Transfers Assistance Needed: ambulated without device ADL's / Homemaking Assistance Needed: independent   Comments: info from previous admission, pt poor historian        OT Problem List: Decreased  activity tolerance;Decreased strength;Decreased cognition;Decreased safety awareness;Pain      OT Treatment/Interventions: Self-care/ADL training;Therapeutic exercise;DME and/or AE instruction;Therapeutic activities;Cognitive remediation/compensation;Patient/family education;Balance training    OT Goals(Current goals can be found in the care plan section) Acute Rehab OT Goals Patient Stated Goal: "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" OT Goal Formulation: With patient Time For Goal Achievement: 09/16/20 Potential to Achieve Goals: Good  OT Frequency: Min 2X/week           Co-evaluation PT/OT/SLP Co-Evaluation/Treatment: Yes Reason for Co-Treatment: For patient/therapist safety;To address functional/ADL transfers   OT goals addressed during session: ADL's and self-care      AM-PAC OT "6 Clicks" Daily Activity     Outcome Measure Help from another person eating meals?: A Little Help from another person taking care of personal grooming?: A Little Help from another person toileting, which includes using toliet, bedpan, or urinal?: A Lot Help from another person bathing (including washing, rinsing, drying)?: A Lot Help from another person to put on and taking off regular upper body clothing?: A Little Help from another person to put on and taking off regular lower body clothing?: Total 6 Click Score: 14   End of Session Equipment Utilized During Treatment: Gait belt;Rolling walker Nurse Communication: Mobility status  Activity Tolerance: Patient tolerated treatment well Patient left: in chair;with call bell/phone within reach;with chair alarm set  OT Visit Diagnosis: Unsteadiness on feet (R26.81);Other abnormalities of gait and mobility (R26.89);Muscle weakness (generalized) (M62.81);Other symptoms and signs involving cognitive function  Time: 0931-1216 OT Time Calculation (min): 23 min Charges:  OT General Charges $OT Visit: 1 Visit OT Evaluation $OT Eval Low  Complexity: [place] OT OT pager: [place] 09/02/2020, 10:59 AM

## 2020-09-02 NOTE — Procedures (Signed)
Central Venous Catheter Insertion Procedure Note  [name]  [id number]  17-Apr-1946  Date:09/02/20  Time:4:30 PM   Provider Performing:Patt Steinhardt Harle Battiest NP  Procedure: Insertion of Non-tunneled Central Venous Catheter(36556)with US guidance ([id number])    Indication(s) Hemodialysis  Consent Risks of the procedure as well as the alternatives and risks of each were explained to the patient and/or caregiver.  Consent for the procedure was obtained and is signed in the bedside chart  Anesthesia Topical only with 1% lidocaine   Timeout Verified patient identification, verified procedure, site/side was marked, verified correct patient position, special equipment/implants available, medications/allergies/relevant history reviewed, required imaging and test results available.  Sterile Technique Maximal sterile technique including full sterile barrier drape, hand hygiene, sterile gown, sterile gloves, mask, hair covering, sterile ultrasound probe cover (if used).  Procedure Description Area of catheter insertion was cleaned with chlorhexidine and draped in sterile fashion.   With real-time ultrasound guidance a HD catheter attempted placement into Right IJ. The vessel was easily cannulated, but the guidewire would not thread. The needle and guidewire were removed and dressed with a vaseline gauze dressing. Pressure was applied until bleeding stopped . The patient was stable and sats remained 100% after attempt. Dr. [place] [name] to attempt in L IJ for access.  Complications/Tolerance None; patient tolerated the procedure well. Chest X-ray is ordered to verify placement for internal jugular or subclavian cannulation.  Chest x-ray is not ordered for femoral cannulation.  EBL Minimal  Specimen(s) None  Unsuccessful attempt at HD cath placement due to inability to get guidewire to thread.   Magdalen Spatz, MSN, AGACNP-BC [place] for personal  pager 09/02/2020 4:34 PM

## 2020-09-02 NOTE — Evaluation (Signed)
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  Clinical Impression  The patient is quite restless, kept calling  For his daughter. Intermittently oriented to hospital. Patient currently requires mod assistance of 2 for transfers. Patient not reliable in providing information about his functional level PTA.  Pt admitted with above diagnosis.  Pt currently with functional limitations due to the deficits listed below (see PT Problem List). Pt will benefit from skilled PT to increase their independence and safety with mobility to allow discharge to the venue listed below.       Follow Up Recommendations SNF    Equipment Recommendations  None recommended by PT    Recommendations for Other Services       Precautions / Restrictions Precautions Precautions: Fall Restrictions Weight Bearing Restrictions: No      Mobility  Bed Mobility Overal bed mobility: Needs Assistance Bed Mobility: Supine to Sit     Supine to sit: Min assist;HOB elevated     General bed mobility comments: increased time and assist with trunk to sit upright    Transfers Overall transfer level: Needs assistance Equipment used: Rolling walker (2 wheeled) Transfers: Sit to/from [facility] Sit to Stand: Mod assist;+2 physical assistance;+2 safety/equipment Stand pivot transfers: Mod assist;+2 physical assistance;+2 safety/equipment       General transfer comment: mod assist to rise from bed at RW, slow small steps  to side and then turned to recliner, cues for safety, reaching for  recliner  Ambulation/Gait             General Gait Details: NT  Stairs            Wheelchair Mobility    Modified Rankin (Stroke Patients Only)       Balance Overall balance assessment: Needs assistance Sitting-balance support: Feet supported Sitting balance-Leahy Scale: Fair   Postural control: Right lateral lean Standing balance support: Bilateral upper extremity supported Standing balance-Leahy Scale: Poor Standing balance comment: relies on external assist and support from RW and therapist                             Pertinent Vitals/Pain Pain Assessment: Faces Faces Pain Scale: Hurts little more Pain Location: R arm when touched Pain Descriptors / Indicators: Sore;Guarding Pain Intervention(s): Monitored during session    Home Living Family/patient expects to be discharged to:: Private residence Living Arrangements: Spouse/significant other Available Help at Discharge: Family Type of Home: House Home Access: Stairs to enter   Technical brewer of Steps: 5 Home Layout: One level Home Equipment: Environmental consultant - 2 wheels Additional Comments: info from prev admission, pt poor historian    Prior Function Level of Independence: Independent   Gait / Transfers Assistance Needed: pt reports ambulatory , unsure  ADL's / Homemaking Assistance Needed: independent  Comments: unsure of prior function     Hand Dominance   Dominant Hand: Right    Extremity/Trunk Assessment   Upper Extremity Assessment Upper Extremity Assessment: Generalized weakness    Lower Extremity Assessment Lower Extremity Assessment: RLE deficits/detail;LLE deficits/detail RLE Deficits / Details: noted bruises and  sores on legs, edema LLE Deficits / Details: same as right       Communication   Communication: No difficulties  Cognition Arousal/Alertness: Awake/alert Behavior During Therapy: Restless Overall Cognitive Status: No family/caregiver present to  determine baseline cognitive functioning                                 General Comments: patient disoriented to place after being told at [facility] state he is at his [place] ~2 mins later, keeps looking for DTR "she's right there" pointing to RN in hallway. Unable to gather history if patient coming from home vs SNF      General Comments      Exercises     Assessment/Plan    PT Assessment Patient needs continued PT services  PT Problem List Decreased strength;Decreased mobility;Decreased safety awareness;Decreased coordination;Decreased knowledge of precautions;Decreased activity tolerance;Decreased cognition;Decreased balance;Decreased knowledge of use of DME       PT Treatment Interventions DME instruction;Therapeutic activities;Cognitive remediation;Gait training;Therapeutic exercise;Patient/family education;Functional mobility training;Balance training    PT Goals (Current goals can be found in the Care Plan section)  Acute Rehab PT Goals Patient Stated Goal: "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" PT Goal Formulation: Patient unable to participate in goal setting Time For Goal Achievement: 09/16/20 Potential to Achieve Goals: Fair    Frequency Min 2X/week   Barriers to discharge        Co-evaluation   Reason for Co-Treatment: For patient/therapist safety;To address functional/ADL transfers   OT goals addressed during session: ADL's and self-care       AM-PAC PT "6 Clicks" Mobility  Outcome Measure Help needed turning from your back to your side while in a flat bed without using bedrails?: A Little Help needed moving from lying on your back to sitting on the side of a flat bed without using bedrails?: A Little Help needed moving to and from a bed to a chair (including a wheelchair)?: A Lot Help needed standing up from a chair using your arms (e.g., wheelchair or bedside chair)?: A Lot Help needed to walk in hospital room?: A Lot Help needed climbing  3-5 steps with a railing? : Total 6 Click Score: 13    End of Session Equipment Utilized During Treatment: Gait belt Activity Tolerance: Patient tolerated treatment well Patient left: in chair;with call bell/phone within reach;with chair alarm set Nurse Communication: Mobility status PT Visit Diagnosis: Muscle weakness (generalized) (M62.81);Difficulty in walking, not elsewhere classified (R26.2);Pain Pain - Right/Left: Right Pain - part of body: Arm    Time: 5009-3818 PT Time Calculation (min) (ACUTE ONLY): 26 min   Charges:   PT Evaluation $PT Eval Low Complexity: [place] PT Acute Rehabilitation Services Pager [phone] Office [phone]   Claretha Cooper 09/02/2020, 12:44 PM

## 2020-09-02 NOTE — Progress Notes (Signed)
[facility] NEPHROLOGY PROGRESS NOTE   CXR 12/12 - IMPRESSION: Small left pleural effusion.  Increased bibasilar opacities. Cardiomegaly and central pulmonary vascular congestion.     Home meds:   - hydralazine 25 tid/ lasix 80 qd/ coreg 6.25 bid  - prograf 1 bid/ cellcept 500 bid/ pred 5 qd  - percocet prn/ neurontin 100 bid prn  - lipotor 10 / asa 81  - zyloprim 100  - humalog ssi tid ac/ lantus 7-15 u per day  - prn's/ vitamins/ supplements  Physical Exam: General:NAD, comfortable Heart:RRR, s1s2 nl Lungs:clear b/l, no crackle Abdomen:soft, Non-tender, non-distended Extremities: diffuse 2+ pitting edema of the arms and legs Neurology: Alert , confused, thinks he is at the "grocery store", knows the year, mild asterixis on exam    Summary: Pt is a 74 y.o. yo male  with history of hypertension, HLD, type II DM, CHF, A. fib, ESRD status post kidney transplant in 1983, 2005, CKD 4 with baseline creatinine level around 2..0-2.2, admitted with left foot infection, osteomyelitis, seen as a consultation for  evaluation of AKI on CKD.  Problems: 1. AKI on CKD stage IV / renal transplant - AKI likely due to sepsis/hemodynamic changes related with hypertensive urgency on admission and then hypotension after NTG IV (SBP to 50s). Baseline creatinine 2.08-2.40.  CT scan with unremarkable transplant kidney. UA with proteinuria. Discontinued IV fluid, received a dose of Lasix 12/12.  Creat up 3.4 today, BUN 80's.  Pt somewhat confused this am and mild asterixis, c/w uremic symptoms. Also sig vol overloaded and needs fluid removal. Needs dialysis. Will plan CRRT here for 1-2 days to stabilize then move to [facility] for iHD.  Consulting IR for temp cath.  Have d/w pt's wife and the patient.  2. History of kidney transplant x2 , last in 2005: Continue Prograf 1 mg twice a day and Cellcept 500 bid. Currently on higher dose of prednisone to treat gout flare.   3. Metabolic acidosis: Started  sodium bicarbonate. 4. Acute metabolic encephalopathy: CT scan with no acute finding however he does have advanced atherosclerosis. Minimize sedatives.  Mental status much better.  5. Possible gout flare: Uric acid level elevated. On po steroid. 6. Hypertension: Required nitroglycerin drip on admission. Continue carvedilol. Diuretics on hold. Monitor blood pressure.  Kelly Splinter, MD 09/02/2020, 12:41 PM    Subjective: UOP 600 cc y est.  Creat up slightly   Objective Vital signs in last 24 hours: Vitals:   09/02/20 0500 09/02/20 0900 09/02/20 1010 09/02/20 1100  BP:   (!) 143/78   Pulse:      Resp:      Temp:  97.6 F (36.4 C)  98 F (36.7 C)  TempSrc:  Oral  Oral  SpO2:      Weight: 93.4 kg     Height:       Weight change: -2.323 kg  Intake/Output Summary (Last 24 hours) at 09/02/2020 1241 Last data filed at 09/02/2020 0500 Gross per 24 hour  Intake 1033.31 ml  Output 600 ml  Net 433.31 ml       Labs: Basic Metabolic Panel: Recent Labs  Lab 08/31/20 0244 09/01/20 0237 09/02/20 0220  NA 142 135 133*  K 4.3 4.6 5.2*  CL 113* 107 105  CO2 17* 18* 16*  GLUCOSE 319* 185* 253*  BUN 69* 92* 87*  CREATININE 3.03* 3.27* 3.40*  CALCIUM 8.7* 8.5* 8.5*  PHOS 2.8 3.4  3.4 3.6   Liver Function Tests: Recent Labs  Lab 08/29/20 0243 08/30/20 0221 08/31/20 0244 09/01/20 0237  AST 24 20 22   --   ALT 10 9 9   --   ALKPHOS 80 67 65  --   BILITOT 0.6 0.8 0.6  --   PROT 5.6* 5.2* 5.3*  --   ALBUMIN 3.2* 2.9* 2.7* 2.6*   No results for input(s): LIPASE, AMYLASE in the last 168 hours. Recent Labs  Lab 08/26/20 2203  AMMONIA 31   CBC: Recent Labs  Lab 08/29/20 0243 08/30/20 0221 08/31/20 0244 09/01/20 0237 09/02/20 0220  WBC 6.8 8.6 11.1* 10.0 6.9  NEUTROABS 4.8 6.9 10.0* 9.4* 6.3  HGB 9.8* 8.9* 8.2* 7.8* 8.2*  HCT 33.1* 30.5* 28.3* 27.0* 28.0*  MCV 96.8 95.6 95.0 96.4 94.0  PLT 116* 79* 76* 87* 107*   Cardiac Enzymes: No results for input(s):  CKTOTAL, CKMB, CKMBINDEX, TROPONINI in the last 168 hours. CBG: Recent Labs  Lab 09/01/20 1549 09/01/20 1935 09/01/20 2136 09/02/20 0720 09/02/20 1131  GLUCAP 215* 240* 231* 214* 254*    Iron Studies: No results for input(s): IRON, TIBC, TRANSFERRIN, FERRITIN in the last 72 hours. Studies/Results: No results found.  Medications: Infusions: . sodium chloride    . sodium chloride 50 mL/hr at 09/01/20 2250  . cefTRIAXone (ROCEPHIN)  IV Stopped (09/02/20 0141)  . nitroGLYCERIN Stopped (08/29/20 0846)  . predniSONE      Scheduled Medications: . (feeding supplement) PROSource Plus  30 mL Oral BID BM  . allopurinol  100 mg Oral Daily  . aspirin EC  162 mg Oral BID  . atorvastatin  10 mg Oral Daily  . calcitRIOL  0.25 mcg Oral Daily  . carvedilol  12.5 mg Oral BID WC  . Chlorhexidine Gluconate Cloth  6 each Topical Daily  . heparin  5,000 Units Subcutaneous Q8H  . hydrALAZINE  25 mg Oral TID  . insulin aspart  0-5 Units Subcutaneous QHS  . insulin aspart  0-9 Units Subcutaneous TID WC  . insulin aspart  14 Units Subcutaneous TID WC  . insulin glargine  26 Units Subcutaneous Daily  . latanoprost  1 drop Both Eyes QHS  . linagliptin  5 mg Oral Daily  . mouth rinse  15 mL Mouth Rinse BID  . multivitamin with minerals  1 tablet Oral Daily  . mycophenolate  500 mg Oral BID  . nutrition supplement (JUVEN)  1 packet Oral BID BM  . sodium bicarbonate  650 mg Oral TID  . sodium chloride flush  3 mL Intravenous Q12H  . sodium chloride flush  3 mL Intravenous Q12H  . sodium zirconium cyclosilicate  10 g Oral BID  . tacrolimus  1 mg Oral BID    have reviewed scheduled and prn medications.

## 2020-09-02 NOTE — Plan of Care (Signed)
  Problem: Clinical Measurements: Goal: Ability to avoid or minimize complications of infection will improve Outcome: Progressing   Problem: Skin Integrity: Goal: Skin integrity will improve Outcome: Not Progressing

## 2020-09-03 LAB — RENAL FUNCTION PANEL
Albumin: 3 g/dL — ABNORMAL LOW (ref 3.5–5.0)
Albumin: 3 g/dL — ABNORMAL LOW (ref 3.5–5.0)
Anion gap: 12 (ref 5–15)
Anion gap: 9 (ref 5–15)
BUN: 92 mg/dL — ABNORMAL HIGH (ref 8–23)
BUN: 58 mg/dL — ABNORMAL HIGH (ref 8–23)
CO2: 19 mmol/L — ABNORMAL LOW (ref 22–32)
CO2: 24 mmol/L (ref 22–32)
Calcium: 8.8 mg/dL — ABNORMAL LOW (ref 8.9–10.3)
Calcium: 8.7 mg/dL — ABNORMAL LOW (ref 8.9–10.3)
Chloride: 110 mmol/L (ref 98–111)
Chloride: 104 mmol/L (ref 98–111)
Creatinine, Ser: 2.36 mg/dL — ABNORMAL HIGH (ref 0.61–1.24)
Creatinine, Ser: 1.43 mg/dL — ABNORMAL HIGH (ref 0.61–1.24)
GFR, Estimated: 28 mL/min — ABNORMAL LOW (ref >60)
GFR, Estimated: 52 mL/min — ABNORMAL LOW (ref >60)
Glucose, Bld: 38 mg/dL — CL (ref 70–99)
Glucose, Bld: 80 mg/dL (ref 70–99)
Phosphorus: 2.8 mg/dL (ref 2.5–4.6)
Phosphorus: 2.1 mg/dL — ABNORMAL LOW (ref 2.5–4.6)
Potassium: 4.5 mmol/L (ref 3.5–5.1)
Potassium: 4.3 mmol/L (ref 3.5–5.1)
Sodium: 141 mmol/L (ref 135–145)
Sodium: 137 mmol/L (ref 135–145)

## 2020-09-03 LAB — BASIC METABOLIC PANEL
Anion gap: 11 (ref 5–15)
BUN: 96 mg/dL — ABNORMAL HIGH (ref 8–23)
CO2: 19 mmol/L — ABNORMAL LOW (ref 22–32)
Calcium: 8.8 mg/dL — ABNORMAL LOW (ref 8.9–10.3)
Chloride: 109 mmol/L (ref 98–111)
Creatinine, Ser: 2.29 mg/dL — ABNORMAL HIGH (ref 0.61–1.24)
GFR, Estimated: 29 mL/min — ABNORMAL LOW (ref >60)
Glucose, Bld: 37 mg/dL — CL (ref 70–99)
Potassium: 4.5 mmol/L (ref 3.5–5.1)
Sodium: 139 mmol/L (ref 135–145)

## 2020-09-03 LAB — GLUCOSE, CAPILLARY
Glucose-Capillary: 125 mg/dL — ABNORMAL HIGH (ref 70–99)
Glucose-Capillary: 34 mg/dL — CL (ref 70–99)
Glucose-Capillary: 48 mg/dL — ABNORMAL LOW (ref 70–99)
Glucose-Capillary: 53 mg/dL — ABNORMAL LOW (ref 70–99)
Glucose-Capillary: 72 mg/dL (ref 70–99)
Glucose-Capillary: 42 mg/dL — CL (ref 70–99)
Glucose-Capillary: 110 mg/dL — ABNORMAL HIGH (ref 70–99)
Glucose-Capillary: 43 mg/dL — CL (ref 70–99)
Glucose-Capillary: 62 mg/dL — ABNORMAL LOW (ref 70–99)
Glucose-Capillary: 221 mg/dL — ABNORMAL HIGH (ref 70–99)

## 2020-09-03 LAB — CBC WITH DIFFERENTIAL/PLATELET
Abs Immature Granulocytes: 0.11 10*3/uL — ABNORMAL HIGH (ref 0.00–0.07)
Basophils Absolute: 0 10*3/uL (ref 0.0–0.1)
Basophils Relative: 0 %
Eosinophils Absolute: 0 10*3/uL (ref 0.0–0.5)
Eosinophils Relative: 0 %
HCT: 28.8 % — ABNORMAL LOW (ref 39.0–52.0)
Hemoglobin: 8.6 g/dL — ABNORMAL LOW (ref 13.0–17.0)
Immature Granulocytes: 2 %
Lymphocytes Relative: 7 %
Lymphs Abs: 0.5 10*3/uL — ABNORMAL LOW (ref 0.7–4.0)
MCH: 28.1 pg (ref 26.0–34.0)
MCHC: 29.9 g/dL — ABNORMAL LOW (ref 30.0–36.0)
MCV: 94.1 fL (ref 80.0–100.0)
Monocytes Absolute: 0.6 10*3/uL (ref 0.1–1.0)
Monocytes Relative: 8 %
Neutro Abs: 6.3 10*3/uL (ref 1.7–7.7)
Neutrophils Relative %: 83 %
Platelets: 194 10*3/uL (ref 150–400)
RBC: 3.06 MIL/uL — ABNORMAL LOW (ref 4.22–5.81)
RDW: 14.4 % (ref 11.5–15.5)
WBC: 7.4 10*3/uL (ref 4.0–10.5)
nRBC: 0 % (ref 0.0–0.2)

## 2020-09-03 LAB — PHOSPHORUS: Phosphorus: 2.8 mg/dL (ref 2.5–4.6)

## 2020-09-03 LAB — APTT: aPTT: 35 seconds (ref 24–36)

## 2020-09-03 LAB — MAGNESIUM: Magnesium: 2.5 mg/dL — ABNORMAL HIGH (ref 1.7–2.4)

## 2020-09-03 MED ORDER — DEXTROSE 10 % IV SOLN: INTRAVENOUS | Status: DC

## 2020-09-03 MED ORDER — HALOPERIDOL LACTATE 5 MG/ML IJ SOLN
1.0000 mg | Freq: Once | INTRAMUSCULAR | Status: AC
  Administered 2020-09-03: 11:00:00 1 mg via INTRAMUSCULAR
  Filled 2020-09-03: qty 1

## 2020-09-03 MED ORDER — DEXTROSE 50 % IV SOLN
INTRAVENOUS | Status: AC
  Filled 2020-09-03: qty 50

## 2020-09-03 MED ORDER — DEXTROSE 50 % IV SOLN: 25.0000 g | INTRAVENOUS | Status: AC

## 2020-09-03 MED ORDER — DEXTROSE 50 % IV SOLN
25.0000 g | INTRAVENOUS | Status: AC
  Administered 2020-09-03: 04:00:00 25 g via INTRAVENOUS

## 2020-09-03 MED ORDER — INSULIN ASPART 100 UNIT/ML ~~LOC~~ SOLN
10.0000 [IU] | Freq: Three times a day (TID) | SUBCUTANEOUS | Status: DC
  Administered 2020-09-04 – 2020-09-05 (×3): 10 [IU] via SUBCUTANEOUS

## 2020-09-03 MED ORDER — PREDNISONE 20 MG PO TABS
50.0000 mg | ORAL_TABLET | Freq: Once | ORAL | Status: AC
  Administered 2020-09-03: 16:00:00 50 mg via ORAL
  Filled 2020-09-03: qty 2

## 2020-09-03 MED ORDER — LORAZEPAM 2 MG/ML IJ SOLN
1.0000 mg | Freq: Once | INTRAMUSCULAR | Status: AC
  Administered 2020-09-03: 1 mg via INTRAVENOUS
  Filled 2020-09-03: qty 1

## 2020-09-03 MED ORDER — DEXTROSE 50 % IV SOLN
INTRAVENOUS | Status: AC
  Administered 2020-09-03: 13:00:00 25 g via INTRAVENOUS
  Filled 2020-09-03: qty 50

## 2020-09-03 MED ORDER — INSULIN GLARGINE 100 UNIT/ML ~~LOC~~ SOLN
18.0000 [IU] | Freq: Every day | SUBCUTANEOUS | Status: DC
  Administered 2020-09-04 – 2020-09-05 (×2): 18 [IU] via SUBCUTANEOUS
  Filled 2020-09-03 (×4): qty 0.18

## 2020-09-03 NOTE — Progress Notes (Signed)
Patient HR was noted to be in high 50s and low 60s at beginning of this shift (1900 pm). Patient HR is now in low 40s and dropping to into 30s with pause and PVC. Sharlet Salina, Np notified as well

## 2020-09-03 NOTE — Progress Notes (Signed)
Patient has been violent and trying to hit staff. Patient has been placed in bilateral soft wrist restraints and posey belt. Patient is refusing all medications and care except CRRT. Pt has been verbally aggressive as well. This nurse has tried to educate and comfort patient. Patient will not cooperate. M. Sharlet Salina, NP has been informed of patient status and care.

## 2020-09-03 NOTE — Progress Notes (Signed)
Hypoglycemic Event  CBG: 42  Treatment: D50 50 mL (25 gm)  Symptoms: None  Follow-up CBG: Time:1255 CBG Result:110  Possible Reasons for Event: Inadequate meal intake  Comments/MD notified:Dr. Doyce Loose

## 2020-09-03 NOTE — Progress Notes (Addendum)
Pt became very agitated, attempting to get out of bed stating that we are in his house, that we didn't make an appointment. Attempted to reorient patient unsuccessfully pt began swinging and hitting at staff saying get out of my house and I want this out of my belly. Attempted to call pts wife, no answer VM left. Dr. Dwyane Dee paged and seen patient at bedside, orders given for 1mg  IM haldol and place posey vest.

## 2020-09-03 NOTE — Progress Notes (Signed)
Inpatient Diabetes Program Recommendations  AACE/ADA: New Consensus Statement on Inpatient Glycemic Control (2015)  Target Ranges:  Prepandial:   less than 140 mg/dL      Peak postprandial:   less than 180 mg/dL (1-2 hours)      Critically ill patients:  140 - 180 mg/dL   Lab Results  Component Value Date   GLUCAP 72 09/03/2020   HGBA1C 6.6 (H) 08/27/2020    Review of Glycemic Control Results for RENARD, CAPERTON (MRN [id number]) as of 09/03/2020 10:04  Ref. Range 09/03/2020 07:47 09/03/2020 07:49 09/03/2020 08:25 09/03/2020 08:28  Glucose-Capillary Latest Ref Range: 70 - 99 mg/dL 37 (LL) 48 (L) 53 (L) 72   Diabetes history: DM2 Outpatient Diabetes medications: Lantus qd (15 units if CBG >300 & 7 units if CBG <300) + Humalog 3-5 units tid meal coverage + Tradjenta 5 mg qd Current orders for Inpatient glycemic control: Novolog 14 units tid meal coverage tid + Novolog sensitive correction 0-9 units tid + hs 0-5 units + Tradjenta 5 units + Lantus 26 units Qd  Prednisone 50 mg qd.  Inpatient Diabetes Program Recommendations:   Noted severe hypoglycemia in the 30's mg/dL this AM. Assuming related to increases made to insulin regimen on 12/14 and renal status.  Reached out to RN to encouraged holding Lantus dose this AM, until further orders given. At this time, consider: -Decreasing Lantus to 18 units QD (to start 12/16) -Decreasing Novolog to 10 units TID (assuming patient is to consume >50% of meal; to start at 1700 with supper)   Thanks, Bronson Curb, MSN, RNC-OB Diabetes Coordinator [phone] (8a-5p)

## 2020-09-03 NOTE — Progress Notes (Signed)
Hypoglycemic Event  CBG: 53  Treatment: 8 oz juice/soda  Symptoms: None  Follow-up CBG: Time: 0828 CBG Result:72  Possible Reasons for Event: Medication regimen: Insulin   Comments/MD notified: Dr. Doyce Loose

## 2020-09-03 NOTE — Progress Notes (Signed)
Pt remains confused despite attempts to re-orient, pt refused PO meds and lunch. Dr Dwyane Dee notified

## 2020-09-03 NOTE — Progress Notes (Addendum)
PROGRESS NOTE    Stephaun [name]  YOK:[id number] DOB: 07-10-1946 DOA: 08/26/2020 PCP: [name], MD   Brief Narrative:  This 74 yrs old male with PMH of renal transplant x 2 (1983, 2005), CKD stage IIIb ( Baseline Cr 2.08-2.2) , chronic diastolic CHF(EF 39-53%), essential hypertension, diabetes type 2  with complication diabetic nephropathy, chronic left foot wound presented in the emergency department with confusion. Patient was found to be febrile on arrival to ED. Noncontrast head CT negative for acute intracranial abnormality. Chest x-ray notable for cardiomegaly with mild diffuse interstitial edema.  X-ray left foot shows soft tissue swelling around left fifth MTP without acute bony abnormality.  Lactic acid 1.9 with mild leukocytosis and thrombocytopenia. Patient was admitted for sepsis secondary to left foot cellulitis. Orthopedics thinks patient's left foot pain is secondary to gout,  not osteomyelitis. Patient was hypertensive on arrival and became hypotensive after getting nitroglycerin IV with systolic BP in 50. BP stable now. Patient's serum creatinine worsened to 3.40 on 12/14, CRRT ( continuous renal replacement therapy) started due to uremic symptoms(AMS). Labs improving but patient is still confused.  Nephrology recommended transfer to [facility] for regular hemodialysis.    Assessment & Plan:   Principal Problem:   Sepsis due to cellulitis [facility]) Active Problems:   Kidney transplant status, cadaveric   Essential hypertension   Acute encephalopathy   Anemia due to chronic kidney disease   Diabetes mellitus type 2, insulin dependent (HCC)   Chronic kidney disease, stage 3b (HCC)   Hypertensive emergency   Anemia, unspecified   Chronic renal failure, stage 3b ([place])   Renal transplant, status post   Diabetes mellitus type 2, controlled, with complications ([place])   Diabetic nephropathy ([place])   Pressure injury of skin   Malnutrition of moderate degree   Idiopathic  chronic gout of multiple sites without tophus   Systolic and diastolic CHF, acute (HCC)   Sepsis sec. to Left foot cellulitis : -  Patient met sepsis criteria on admission,  temp > 38 C, RR> 20, point of infection cellulitis bilateral lower extremity and probable  -Continued on IV antibiotics ( Ceftriaxone DC 12/14) - Blood cultures  No growth so far. - MRI LEFT foot; poor MRI secondary to motion artifact however infection, and erosions of metatarsals bone. - Lactic acid 1.9  procalcitonin 2.17 -  Discussed case with Dr. Sharol Given orthopedics, states  patient's lower extremity pain is sec to his gout and no surgical intervention warranted. -  Sepsis physiology resolved.   Acute encephalopathycould be sec or uremia: - Head CT negative and no focal neuro deficits identified. - RPR, TSH, vitamin B12, ammonia normal. - Hold All sedating medication except for gabapentin. -  Patient continued on CRRT, labs improving but continued to be confused.  Renal transplant /CKD stage IIIb (baseline Cr ~1.85) - Renally dose all medications. - Hold all renal toxic medication -  Continue CellCept and prograf. - Increased Prednisone 50 mg daily for possible Gout Flare. (Normal dose 5 mg daily)   - Continue to hold diuretics and IV fluids.  Anemia chronic disease (baseline HgB 10.9) -Most likely secondary to CKD, anemia of chronic disease -Anemia panel; consistent with anemia of chronic disease -Fecal occult blood pending  Hypertensive urgency >>> resolved.  Acute Systolic and Diastolic CHF -20/23 Echocardiogram LVEF= 40 to 34%, with diastolic dysfunction. -12/13 per cardiology advise if ischemic evaluation is needed, but this would not be scheduled until his general medical condition improves.  Agree patient fluid  and require diuresis,  however also secondary to his current renal function should be managed by nephrology.  Chest pain >> Resolved.  Hypotension >> Improved --12/10 Albumin 25g   X 1   Pleural effusion -Moderate pleural effusion seen on echocardiogram. -12/12 PCXR; small effusion and pulmonary congestion.  DM type II controlled with complication /DM nephropathy -12/8 hemoglobin A1c= 6.6 -She had hypoglycemic episodes twice last night. - Decrease Lantus to 18 units  -Sensitive SSI -Tradjenta 5 mg daily  Gout -12/9 Uric acid = 10.7  -When patient's renal function begins to improve ,  will start colchicine -Other than his LEFT FIFTH METATARSAL NO JOINT PAIN. -Continue prednisone for his renal transplant; and gout flare.  Hypomagnesemia >> Improved -Magnesium goal>2  Hyperkalemia: >>>> Resolved.  -5/14 Lokelma 10 g x 2 dose  Bilateral arm pain chronic Bilateral upper extremity  Duplex : Negative for DVT.  Goals of care -12/12 PT/OT consult; evaluate patient for placement to SNF VS CIR.      DVT prophylaxis:  Heparin sq Code Status:  Full code Family Communication: No one at bed side. Disposition Plan:   Status is: Inpatient  Remains inpatient appropriate because:Inpatient level of care appropriate due to severity of illness   Dispo: The patient is from: Home              Anticipated d/c is to: TBD              Anticipated d/c date is: > 3 days              Patient currently is not medically stable to d/c.   Consultants:   Cardiology  Orthopedics surgery  Nephrology  Procedures:  Central line . Temporary Femoral line for HD.   Antimicrobials:  Anti-infectives (From admission, onward)   Start     Dose/Rate Route Frequency Ordered Stop   08/26/20 2359  cefTRIAXone (ROCEPHIN) 1 g in sodium chloride 0.9 % 100 mL IVPB        1 g 200 mL/hr over 30 Minutes Intravenous Every 24 hours 08/26/20 2028 09/02/20 0141   08/26/20 1545  ceFEPIme (MAXIPIME) 2 g in sodium chloride 0.9 % 100 mL IVPB        2 g 200 mL/hr over 30 Minutes Intravenous  Once 08/26/20 1532 08/26/20 1631   08/26/20 1545  metroNIDAZOLE (FLAGYL) tablet 500 mg   Status:  Discontinued        500 mg Oral Every 8 hours 08/26/20 1532 08/26/20 2015   08/26/20 1545  vancomycin (VANCOCIN) IVPB 1000 mg/200 mL premix        1,000 mg 200 mL/hr over 60 Minutes Intravenous  Once 08/26/20 1532 08/26/20 1823      Subjective: Patient was seen and examined at bedside.  Overnight events noted,  Patient was started on CRRT 12/14.  Patient continued to remain confused.  He is alert and oriented x 2 but has intermittent agitation and restlessness,  trying to come out of the bed.  Objective: Vitals:   09/03/20 0800 09/03/20 0815 09/03/20 0902 09/03/20 1000  BP: 138/68  137/78 (!) 124/99  Pulse: (!) 53 (!) 54 (!) 55 83  Resp: 13 14 (!) 21 15  Temp: 98.06 F (36.7 C) 98.06 F (36.7 C) 97.88 F (36.6 C) 97.88 F (36.6 C)  TempSrc:      SpO2: 100% 100% 100% 100%  Weight:      Height:        Intake/Output Summary (Last 24 hours)  at 09/03/2020 1044 Last data filed at 09/03/2020 1000 Gross per 24 hour  Intake 120 ml  Output 2039 ml  Net -1919 ml   Filed Weights   09/01/20 1400 09/02/20 0500 09/03/20 0500  Weight: 89.2 kg 93.4 kg 93.4 kg    Examination:  General exam: Appears calm and comfortable ,Restless, agitated. Respiratory system: Clear to auscultation. Respiratory effort normal. Cardiovascular system: S1 & S2 heard, RRR. No JVD, murmurs, rubs, gallops or clicks. No pedal edema. Gastrointestinal system: Abdomen is nondistended, soft and nontender. No organomegaly or masses felt.  Normal bowel sounds heard. Central nervous system: Alert and oriented x 2. No focal neurological deficits. Extremities: No edema, no cyanosis, no clubbing. Skin: Multiple bilateral lower extremity lesions, decreased erythema. Psychiatry: Judgement and insight appear normal. Mood & affect appropriate.     Data Reviewed: I have personally reviewed following labs and imaging studies  CBC: Recent Labs  Lab 08/30/20 0221 08/31/20 0244 09/01/20 0237 09/02/20 0220  09/03/20 0215  WBC 8.6 11.1* 10.0 6.9 7.4  NEUTROABS 6.9 10.0* 9.4* 6.3 6.3  HGB 8.9* 8.2* 7.8* 8.2* 8.6*  HCT 30.5* 28.3* 27.0* 28.0* 28.8*  MCV 95.6 95.0 96.4 94.0 94.1  PLT 79* 76* 87* 107* 324   Basic Metabolic Panel: Recent Labs  Lab 08/30/20 0221 08/31/20 0244 09/01/20 0237 09/02/20 0220 09/02/20 2113 09/03/20 0215  NA 142 142 135 133* 137 139  141  K 4.0 4.3 4.6 5.2* 4.8 4.5  4.5  CL 115* 113* 107 105 108 109  110  CO2 17* 17* 18* 16* 17* 19*  19*  GLUCOSE 291* 319* 185* 253* 74 37*  38*  BUN 51* 69* 92* 87* 113* 96*  92*  CREATININE 2.62* 3.03* 3.27* 3.40* 2.90* 2.29*  2.36*  CALCIUM 8.3* 8.7* 8.5* 8.5* 9.0 8.8*  8.8*  MG 2.0 2.1 2.2 2.5*  --  2.5*  PHOS 2.8 2.8 3.4  3.4 3.6 3.6 2.8  2.8   GFR: Estimated Creatinine Clearance: 31.9 mL/min (A) (by C-G formula based on SCr of 2.29 mg/dL (H)). Liver Function Tests: Recent Labs  Lab 08/27/20 1227 08/28/20 0244 08/29/20 0243 08/30/20 0221 08/31/20 0244 09/01/20 0237 09/02/20 2113 09/03/20 0215  AST 25 23 24 20 22   --   --   --   ALT 10 9 10 9 9   --   --   --   ALKPHOS 83 61 80 67 65  --   --   --   BILITOT 1.1 1.0 0.6 0.8 0.6  --   --   --   PROT 5.9* 5.0* 5.6* 5.2* 5.3*  --   --   --   ALBUMIN 3.4* 2.9* 3.2* 2.9* 2.7* 2.6* 3.1* 3.0*   No results for input(s): LIPASE, AMYLASE in the last 168 hours. No results for input(s): AMMONIA in the last 168 hours. Coagulation Profile: Recent Labs  Lab 08/27/20 1654  INR 1.3*   Cardiac Enzymes: No results for input(s): CKTOTAL, CKMB, CKMBINDEX, TROPONINI in the last 168 hours. BNP (last 3 results) No results for input(s): PROBNP in the last 8760 hours. HbA1C: No results for input(s): HGBA1C in the last 72 hours. CBG: Recent Labs  Lab 09/03/20 0418 09/03/20 0747 09/03/20 0749 09/03/20 0825 09/03/20 0828  GLUCAP 125* 37* 48* 53* 72   Lipid Profile: No results for input(s): CHOL, HDL, LDLCALC, TRIG, CHOLHDL, LDLDIRECT in the last 72  hours. Thyroid Function Tests: No results for input(s): TSH, T4TOTAL, FREET4, T3FREE, THYROIDAB in the last  72 hours. Anemia Panel: No results for input(s): VITAMINB12, FOLATE, FERRITIN, TIBC, IRON, RETICCTPCT in the last 72 hours. Sepsis Labs: Recent Labs  Lab 08/27/20 1616 08/27/20 1648 08/27/20 1940 08/28/20 0244 08/28/20 0944 08/28/20 1241 08/29/20 0243  PROCALCITON 2.60  --   --  2.88  --   --  2.17  LATICACIDVEN  --  1.7 2.1*  --  0.8 1.2  --     Recent Results (from the past 240 hour(s))  Blood Culture (routine x 2)     Status: None   Collection Time: 08/26/20  3:30 PM   Specimen: BLOOD LEFT HAND  Result Value Ref Range Status   Specimen Description   Final    BLOOD LEFT HAND Performed at [facility] [address], [place], [place] [address]    Special Requests   Final    BOTTLES DRAWN AEROBIC AND ANAEROBIC Blood Culture adequate volume Performed at [facility] [address], [place], [place] [address]    Culture   Final    NO GROWTH 5 DAYS Performed at [facility], [place] [address], [place], [place] [address]    Report Status 08/31/2020 FINAL  Final  Blood Culture (routine x 2)     Status: None   Collection Time: 08/26/20  3:30 PM   Specimen: BLOOD  Result Value Ref Range Status   Specimen Description   Final    BLOOD RIGHT ARM Performed at [facility] [address], [place], [place] [address]    Special Requests   Final    BOTTLES DRAWN AEROBIC AND ANAEROBIC Blood Culture results may not be optimal due to an inadequate volume of blood received in culture bottles Performed at [facility] [address], [place], [place] [address]    Culture   Final    NO GROWTH 5 DAYS Performed at [facility], [place] [address], [place], [place] [address]    Report Status 08/31/2020 FINAL  Final  Urine culture     Status: Abnormal   Collection Time: 08/26/20  3:33 PM    Specimen: In/Out Cath Urine  Result Value Ref Range Status   Specimen Description   Final    IN/OUT CATH URINE Performed at [facility] [address], [place], [place] [address]    Special Requests   Final    NONE Performed at [facility], [place] [address] [place], [place] [address]    Culture (A)  Final    <10,000 COLONIES/mL INSIGNIFICANT GROWTH Performed at [facility] [address], [place], [place] [address]    Report Status 08/28/2020 FINAL  Final  Resp Panel by RT-PCR (Flu A&B, Covid) In/Out Cath Urine     Status: None   Collection Time: 08/26/20  4:30 PM   Specimen: In/Out Cath Urine; Nasopharyngeal(NP) swabs in vial transport medium  Result Value Ref Range Status   SARS Coronavirus 2 by RT PCR NEGATIVE NEGATIVE Final    Comment: (NOTE) SARS-CoV-2 target nucleic acids are NOT DETECTED.  The SARS-CoV-2 RNA is generally detectable in upper respiratory specimens during the acute phase of infection. The lowest concentration of SARS-CoV-2 viral copies this assay can detect is 138 copies/mL. A negative result does not preclude SARS-Cov-2 infection and should not be used as the sole basis for treatment or other patient management decisions. A negative result may occur with  improper specimen collection/handling, submission of specimen other than  nasopharyngeal swab, presence of viral mutation(s) within the areas targeted by this assay, and inadequate number of viral copies(<138 copies/mL). A negative result must be combined with clinical observations, patient history, and epidemiological information. The expected result is Negative.  Fact Sheet for Patients:  EntrepreneurPulse.com.au  Fact Sheet for Healthcare Providers:  IncredibleEmployment.be  This test is no t yet approved or cleared by the [place] FDA and  has been authorized for detection and/or diagnosis of SARS-CoV-2 by FDA  under an Emergency Use Authorization (EUA). This EUA will remain  in effect (meaning this test can be used) for the duration of the COVID-19 declaration under Section 564(b)(1) of the Act, 21 U.S.C.section 360bbb-3(b)(1), unless the authorization is terminated  or revoked sooner.       Influenza A by PCR NEGATIVE NEGATIVE Final   Influenza B by PCR NEGATIVE NEGATIVE Final    Comment: (NOTE) The Xpert Xpress SARS-CoV-2/FLU/RSV plus assay is intended as an aid in the diagnosis of influenza from Nasopharyngeal swab specimens and should not be used as a sole basis for treatment. Nasal washings and aspirates are unacceptable for Xpert Xpress SARS-CoV-2/FLU/RSV testing.  Fact Sheet for Patients: EntrepreneurPulse.com.au  Fact Sheet for Healthcare Providers: IncredibleEmployment.be  This test is not yet approved or cleared by the [place] FDA and has been authorized for detection and/or diagnosis of SARS-CoV-2 by FDA under an Emergency Use Authorization (EUA). This EUA will remain in effect (meaning this test can be used) for the duration of the COVID-19 declaration under Section 564(b)(1) of the Act, 21 U.S.C. section 360bbb-3(b)(1), unless the authorization is terminated or revoked.  Performed at [facility], [place] [address], [place], [place] [address]   MRSA PCR Screening     Status: None   Collection Time: 08/27/20 12:22 PM   Specimen: Nasal Mucosa; Nasopharyngeal  Result Value Ref Range Status   MRSA by PCR NEGATIVE NEGATIVE Final    Comment:        The GeneXpert MRSA Assay (FDA approved for NASAL specimens only), is one component of a comprehensive MRSA colonization surveillance program. It is not intended to diagnose MRSA infection nor to guide or monitor treatment for MRSA infections. Performed at [facility], [place] [address] [place], [place] [address]     Radiology Studies: No results  found.  Scheduled Meds: . (feeding supplement) PROSource Plus  30 mL Oral BID BM  . allopurinol  100 mg Oral Daily  . aspirin EC  162 mg Oral BID  . atorvastatin  10 mg Oral Daily  . calcitRIOL  0.25 mcg Oral Daily  . carvedilol  12.5 mg Oral BID WC  . Chlorhexidine Gluconate Cloth  6 each Topical Daily  . haloperidol lactate  1 mg Intramuscular Once  . heparin  5,000 Units Subcutaneous Q8H  . hydrALAZINE  25 mg Oral TID  . insulin aspart  0-5 Units Subcutaneous QHS  . insulin aspart  0-9 Units Subcutaneous TID WC  . insulin aspart  10 Units Subcutaneous TID WC  . insulin glargine  18 Units Subcutaneous Daily  . latanoprost  1 drop Both Eyes QHS  . linagliptin  5 mg Oral Daily  . mouth rinse  15 mL Mouth Rinse BID  . multivitamin with minerals  1 tablet Oral Daily  . mycophenolate  500 mg Oral BID  . nutrition supplement (JUVEN)  1 packet Oral BID BM  . predniSONE  50 mg Oral Daily  . sodium bicarbonate  650 mg  Oral TID  . sodium chloride flush  3 mL Intravenous Q12H  . sodium chloride flush  3 mL Intravenous Q12H  . tacrolimus  1 mg Oral BID   Continuous Infusions: .  prismasol BGK 4/2.5 400 mL/hr at 09/03/20 0827  .  prismasol BGK 4/2.5 200 mL/hr at 09/02/20 2008  . sodium chloride    . sodium chloride 50 mL/hr at 09/01/20 2250  . nitroGLYCERIN Stopped (08/29/20 0846)  . prismasol BGK 4/2.5 1,800 mL/hr at 09/03/20 1007     LOS: 8 days    Time spent: 90 mins    Kizzie Cotten, MD [facility]   If 7PM-7AM, please contact night-coverage

## 2020-09-03 NOTE — Plan of Care (Signed)
Pt increased confusion, pulling at lines attempting to hit staff.

## 2020-09-03 NOTE — Progress Notes (Signed)
Hypoglycemic Event  CBG: 34  Treatment: 25g of D50  Symptoms: Drowiness   Follow-up CBG: Time: 04:15 am CBG Result: 125  Possible Reasons for Event:CRRT and not eating  Comments/MD notified: M. Sharlet Salina, NP    [name]

## 2020-09-03 NOTE — Progress Notes (Signed)
Hypoglycemic Event  CBG: 43  Treatment: 8 oz juice/soda  Symptoms: Shaky  Follow-up CBG: Time:1550 CBG Result:62  Possible Reasons for Event: Unknown  Comments/MD notified:Dr. Kumar/ Dr. Jonnie Finner D10 gtt ordered.     Wanda Plump

## 2020-09-03 NOTE — Progress Notes (Signed)
[facility] NEPHROLOGY PROGRESS NOTE   CXR 12/12 - IMPRESSION: Small left pleural effusion.  Increased bibasilar opacities. Cardiomegaly and central pulmonary vascular congestion.     Home meds:   - hydralazine 25 tid/ lasix 80 qd/ coreg 6.25 bid  - prograf 1 bid/ cellcept 500 bid/ pred 5 qd  - percocet prn/ neurontin 100 bid prn  - lipotor 10 / asa 81  - zyloprim 100  - humalog ssi tid ac/ lantus 7-15 u per day  - prn's/ vitamins/ supplements  Physical Exam: General:NAD, comfortable Heart:RRR, s1s2 nl Lungs:clear b/l, no crackle Abdomen:soft, Non-tender, non-distended Extremities: diffuse 2+ pitting edema of the arms and legs Neurology: Alert , confused, thinks he is at the "grocery store", knows the year, mild asterixis on exam    Summary: Pt is a 74 y.o. yo male  with history of hypertension, HLD, type II DM, CHF, A. fib, ESRD status post kidney transplant in 1983, 2005, CKD 4 with baseline creatinine level around 2..0-2.2, admitted with left foot infection, osteomyelitis, seen as a consultation for  evaluation of AKI on CKD.  Problems: 1. AKI on CKD stage IV / renal transplant - AKI likely due to sepsis/hemodynamic changes related with hypertensive urgency on admission and then hypotension after NTG IV (SBP to 50s). Baseline creatinine 2.08-2.40.  CT scan with unremarkable transplant kidney. UA with proteinuria. Creat worsened from 1.8 on admit >>> up to 3.40 yest and CRRT started due to uremic symptoms (AMS). CRRT started 12/14.  Labs improving, but still quite confused. Is making urine. Pt could receive regular HD at [facility]. Recommend put in for transfer to [facility] when bed available, should not need ICU bed there. Will follow.  2. History of kidney transplant x2 , last in 2005: Continue Prograf 1 mg twice a day and Cellcept 500 bid. Currently on higher dose of prednisone to treat gout.  3. Acute metabolic encephalopathy: CT head negative. Prob uremia + ICU stay +/-  other.  4. Possible gout flare: Uric acid level elevated. On po steroid. 5. Hypertension: Required nitroglycerin drip on admission. Continue carvedilol. Diuretics on hold. Monitor blood pressure.  Kelly Splinter, MD 09/03/2020, 10:51 AM    Subjective: UOP 600 cc y est.  Creat up slightly   Objective Vital signs in last 24 hours: Vitals:   09/03/20 0800 09/03/20 0815 09/03/20 0902 09/03/20 1000  BP: 138/68  137/78 (!) 124/99  Pulse: (!) 53 (!) 54 (!) 55 83  Resp: 13 14 (!) 21 15  Temp: 98.06 F (36.7 C) 98.06 F (36.7 C) 97.88 F (36.6 C) 97.88 F (36.6 C)  TempSrc:      SpO2: 100% 100% 100% 100%  Weight:      Height:       Weight change: 4.223 kg  Intake/Output Summary (Last 24 hours) at 09/03/2020 1051 Last data filed at 09/03/2020 1000 Gross per 24 hour  Intake 120 ml  Output 2039 ml  Net -1919 ml       Labs: Basic Metabolic Panel: Recent Labs  Lab 09/02/20 0220 09/02/20 2113 09/03/20 0215  NA 133* 137 139  141  K 5.2* 4.8 4.5  4.5  CL 105 108 109  110  CO2 16* 17* 19*  19*  GLUCOSE 253* 74 37*  38*  BUN 87* 113* 96*  92*  CREATININE 3.40* 2.90* 2.29*  2.36*  CALCIUM 8.5* 9.0 8.8*  8.8*  PHOS 3.6 3.6 2.8  2.8   Liver Function Tests: Recent Labs  Lab  08/29/20 0243 08/30/20 0221 08/31/20 0244 09/01/20 0237 09/02/20 2113 09/03/20 0215  AST 24 20 22   --   --   --   ALT 10 9 9   --   --   --   ALKPHOS 80 67 65  --   --   --   BILITOT 0.6 0.8 0.6  --   --   --   PROT 5.6* 5.2* 5.3*  --   --   --   ALBUMIN 3.2* 2.9* 2.7* 2.6* 3.1* 3.0*   No results for input(s): LIPASE, AMYLASE in the last 168 hours. No results for input(s): AMMONIA in the last 168 hours. CBC: Recent Labs  Lab 08/30/20 0221 08/31/20 0244 09/01/20 0237 09/02/20 0220 09/03/20 0215  WBC 8.6 11.1* 10.0 6.9 7.4  NEUTROABS 6.9 10.0* 9.4* 6.3 6.3  HGB 8.9* 8.2* 7.8* 8.2* 8.6*  HCT 30.5* 28.3* 27.0* 28.0* 28.8*  MCV 95.6 95.0 96.4 94.0 94.1  PLT 79* 76* 87* 107* 194    Cardiac Enzymes: No results for input(s): CKTOTAL, CKMB, CKMBINDEX, TROPONINI in the last 168 hours. CBG: Recent Labs  Lab 09/03/20 0418 09/03/20 0747 09/03/20 0749 09/03/20 0825 09/03/20 0828  GLUCAP 125* 37* 48* 53* 72    Iron Studies: No results for input(s): IRON, TIBC, TRANSFERRIN, FERRITIN in the last 72 hours. Studies/Results: No results found.  Medications: Infusions: .  prismasol BGK 4/2.5 400 mL/hr at 09/03/20 0827  .  prismasol BGK 4/2.5 200 mL/hr at 09/02/20 2008  . sodium chloride    . sodium chloride 50 mL/hr at 09/01/20 2250  . nitroGLYCERIN Stopped (08/29/20 0846)  . prismasol BGK 4/2.5 1,800 mL/hr at 09/03/20 1007    Scheduled Medications: . (feeding supplement) PROSource Plus  30 mL Oral BID BM  . allopurinol  100 mg Oral Daily  . aspirin EC  162 mg Oral BID  . atorvastatin  10 mg Oral Daily  . calcitRIOL  0.25 mcg Oral Daily  . carvedilol  12.5 mg Oral BID WC  . Chlorhexidine Gluconate Cloth  6 each Topical Daily  . heparin  5,000 Units Subcutaneous Q8H  . hydrALAZINE  25 mg Oral TID  . insulin aspart  0-5 Units Subcutaneous QHS  . insulin aspart  0-9 Units Subcutaneous TID WC  . insulin aspart  10 Units Subcutaneous TID WC  . insulin glargine  18 Units Subcutaneous Daily  . latanoprost  1 drop Both Eyes QHS  . linagliptin  5 mg Oral Daily  . mouth rinse  15 mL Mouth Rinse BID  . multivitamin with minerals  1 tablet Oral Daily  . mycophenolate  500 mg Oral BID  . nutrition supplement (JUVEN)  1 packet Oral BID BM  . predniSONE  50 mg Oral Daily  . sodium bicarbonate  650 mg Oral TID  . sodium chloride flush  3 mL Intravenous Q12H  . sodium chloride flush  3 mL Intravenous Q12H  . tacrolimus  1 mg Oral BID    have reviewed scheduled and prn medications.

## 2020-09-04 LAB — RENAL FUNCTION PANEL
Albumin: 2.9 g/dL — ABNORMAL LOW (ref 3.5–5.0)
Albumin: 3.2 g/dL — ABNORMAL LOW (ref 3.5–5.0)
Anion gap: 11 (ref 5–15)
Anion gap: 10 (ref 5–15)
BUN: 38 mg/dL — ABNORMAL HIGH (ref 8–23)
BUN: 35 mg/dL — ABNORMAL HIGH (ref 8–23)
CO2: 22 mmol/L (ref 22–32)
CO2: 25 mmol/L (ref 22–32)
Calcium: 8.4 mg/dL — ABNORMAL LOW (ref 8.9–10.3)
Calcium: 8.6 mg/dL — ABNORMAL LOW (ref 8.9–10.3)
Chloride: 104 mmol/L (ref 98–111)
Chloride: 101 mmol/L (ref 98–111)
Creatinine, Ser: 1.34 mg/dL — ABNORMAL HIGH (ref 0.61–1.24)
Creatinine, Ser: 1.32 mg/dL — ABNORMAL HIGH (ref 0.61–1.24)
GFR, Estimated: 56 mL/min — ABNORMAL LOW (ref >60)
GFR, Estimated: 57 mL/min — ABNORMAL LOW (ref >60)
Glucose, Bld: 301 mg/dL — ABNORMAL HIGH (ref 70–99)
Glucose, Bld: 186 mg/dL — ABNORMAL HIGH (ref 70–99)
Phosphorus: 2.8 mg/dL (ref 2.5–4.6)
Phosphorus: 1.8 mg/dL — ABNORMAL LOW (ref 2.5–4.6)
Potassium: 5 mmol/L (ref 3.5–5.1)
Potassium: 4.8 mmol/L (ref 3.5–5.1)
Sodium: 137 mmol/L (ref 135–145)
Sodium: 136 mmol/L (ref 135–145)

## 2020-09-04 LAB — GLUCOSE, CAPILLARY
Glucose-Capillary: 37 mg/dL — CL (ref 70–99)
Glucose-Capillary: 304 mg/dL — ABNORMAL HIGH (ref 70–99)
Glucose-Capillary: 230 mg/dL — ABNORMAL HIGH (ref 70–99)
Glucose-Capillary: 210 mg/dL — ABNORMAL HIGH (ref 70–99)
Glucose-Capillary: 181 mg/dL — ABNORMAL HIGH (ref 70–99)
Glucose-Capillary: 130 mg/dL — ABNORMAL HIGH (ref 70–99)

## 2020-09-04 LAB — CBC WITH DIFFERENTIAL/PLATELET
Abs Immature Granulocytes: 0.15 10*3/uL — ABNORMAL HIGH (ref 0.00–0.07)
Basophils Absolute: 0 10*3/uL (ref 0.0–0.1)
Basophils Relative: 0 %
Eosinophils Absolute: 0 10*3/uL (ref 0.0–0.5)
Eosinophils Relative: 0 %
HCT: 29.3 % — ABNORMAL LOW (ref 39.0–52.0)
Hemoglobin: 8.7 g/dL — ABNORMAL LOW (ref 13.0–17.0)
Immature Granulocytes: 2 %
Lymphocytes Relative: 6 %
Lymphs Abs: 0.4 10*3/uL — ABNORMAL LOW (ref 0.7–4.0)
MCH: 28.1 pg (ref 26.0–34.0)
MCHC: 29.7 g/dL — ABNORMAL LOW (ref 30.0–36.0)
MCV: 94.5 fL (ref 80.0–100.0)
Monocytes Absolute: 0.4 10*3/uL (ref 0.1–1.0)
Monocytes Relative: 5 %
Neutro Abs: 6.5 10*3/uL (ref 1.7–7.7)
Neutrophils Relative %: 87 %
Platelets: 166 10*3/uL (ref 150–400)
RBC: 3.1 MIL/uL — ABNORMAL LOW (ref 4.22–5.81)
RDW: 14.4 % (ref 11.5–15.5)
WBC: 7.5 10*3/uL (ref 4.0–10.5)
nRBC: 0 % (ref 0.0–0.2)

## 2020-09-04 LAB — BASIC METABOLIC PANEL
Anion gap: 11 (ref 5–15)
BUN: 36 mg/dL — ABNORMAL HIGH (ref 8–23)
CO2: 22 mmol/L (ref 22–32)
Calcium: 8.4 mg/dL — ABNORMAL LOW (ref 8.9–10.3)
Chloride: 104 mmol/L (ref 98–111)
Creatinine, Ser: 1.34 mg/dL — ABNORMAL HIGH (ref 0.61–1.24)
GFR, Estimated: 56 mL/min — ABNORMAL LOW (ref >60)
Glucose, Bld: 304 mg/dL — ABNORMAL HIGH (ref 70–99)
Potassium: 5 mmol/L (ref 3.5–5.1)
Sodium: 137 mmol/L (ref 135–145)

## 2020-09-04 LAB — PHOSPHORUS: Phosphorus: 2.8 mg/dL (ref 2.5–4.6)

## 2020-09-04 LAB — APTT: aPTT: 41 seconds — ABNORMAL HIGH (ref 24–36)

## 2020-09-04 LAB — MAGNESIUM: Magnesium: 2.4 mg/dL (ref 1.7–2.4)

## 2020-09-04 MED ORDER — GLUCERNA SHAKE PO LIQD
237.0000 mL | Freq: Two times a day (BID) | ORAL | Status: DC
  Administered 2020-09-05 – 2020-09-07 (×3): 237 mL via ORAL
  Filled 2020-09-04 (×3): qty 237

## 2020-09-04 MED ORDER — LABETALOL HCL 5 MG/ML IV SOLN
10.0000 mg | Freq: Once | INTRAVENOUS | Status: AC
  Administered 2020-09-04: 06:00:00 10 mg via INTRAVENOUS
  Filled 2020-09-04: qty 4

## 2020-09-04 NOTE — Progress Notes (Signed)
Patient has refused all 2200 medications.  Provided education, but patient still refused all medications.

## 2020-09-04 NOTE — Progress Notes (Signed)
Nutrition Follow-up  DOCUMENTATION CODES:   Non-severe (moderate) malnutrition in context of chronic illness  INTERVENTION:  - continue Juven BID and 30 ml Prosource Plus BID. - will order Glucerna Shake BID, each supplement provides 220 kcal and 10 grams of protein.  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 -ongoing  GOAL:   Patient will meet greater than or equal to 90% of their needs -unmet currently  MONITOR:   PO intake,Supplement acceptance,Labs,Weight trends  ASSESSMENT:   74 y.o. male with medical history of renal transplant, stage 3 CKD, CHF, HTN, type 2 DM--controlled, DM neuropathy, and chronic L foot wound. He presented to the ED with confusion, L foot pain, and nausea.  Patient was started on CRRT on 12/14; updated estimated nutrition needs. Weight has been stable since initiation of CRRT but is +7 kg/15 lb compared to weight on 12/10. Moderate pitting edema to BLE documented in the flow sheet.  Most recently documented meal intakes are 30% of dinner on 12/10; 75% of dinner on 12/12; 50% of breakfast and dinner on 12/15.   Review of orders indicates that patient has been accepting Juven and Prosource Plus >90% of the time offered.  He is noted to be a/o to self only at this time. Able to talk with RN who reports patient consumed juven and had a few bites each of grits and pancakes with no noticed chewing or swallowing issues. Patient did chew up his pills so nurse plans to crush all oral meds for the rest of shift.    Labs reviewed; CBGs: 304, 203, 210 mg/dl, BUN: 38 mg/dl, creatinine: 1.34 mg/dl, Ca: 8.4 mg/dl, GFR: 56 ml/min. Medications reviewed; sliding scale novolog, 10 units novolog TID, 18 units lantus/day, 5 mg tradjenta/day, 1 tablet multivitamin with minerals/day, 50 mg deltasone/day, 650 mg oral sodium bicarb TID, 1 mg  prograf BID.  IVF; D10 @ 50 ml/hr (408 kcal).    Diet Order:   Diet Order            Diet heart healthy/carb modified Room service appropriate? Yes; Fluid consistency: Thin  Diet effective now                 EDUCATION NEEDS:   Not appropriate for education at this time  Skin:  Skin Assessment: Skin Integrity Issues: Skin Integrity Issues:: Stage I,Diabetic Ulcer Stage I: sacrum Diabetic Ulcer: L foot  Last BM:  12/13  Height:   Ht Readings from Last 1 Encounters:  09/01/20 5\' 8"  (1.727 m)    Weight:   Wt Readings from Last 1 Encounters:  09/04/20 93.2 kg    Estimated Nutritional Needs:  Kcal:  2185-2495 kcal (28-32 kcal/kg adjBW) Protein:  130-140 grams Fluid:  >/= 2 L/day      Jarome Matin, MS, RD, LDN, CNSC Inpatient Clinical Dietitian RD pager # available in [facility]  After hours/weekend pager # available in [facility]

## 2020-09-04 NOTE — Progress Notes (Signed)
[facility] NEPHROLOGY PROGRESS NOTE   CXR 12/12 - IMPRESSION: Small left pleural effusion.  Increased bibasilar opacities. Cardiomegaly and central pulmonary vascular congestion.     Home meds:   - hydralazine 25 tid/ lasix 80 qd/ coreg 6.25 bid  - prograf 1 bid/ cellcept 500 bid/ pred 5 qd  - percocet prn/ neurontin 100 bid prn  - lipotor 10 / asa 81  - zyloprim 100  - humalog ssi tid ac/ lantus 7-15 u per day  - prn's/ vitamins/ supplements  Physical Exam: General:NAD, comfortable Heart:RRR, s1s2 nl Lungs:clear b/l, no crackle Abdomen:soft, Non-tender, non-distended Extremities: diffuse 2+ pitting edema of the arms and legs Neurology: Alert , confused, thinks he is at the "grocery store", knows the year, mild asterixis on exam    Summary: Pt is a 74 y.o. yo male  with history of hypertension, HLD, type II DM, CHF, A. fib, ESRD status post kidney transplant in 1983, 2005, CKD 4 with baseline creatinine level around 2..0-2.2, admitted with left foot infection, osteomyelitis, seen as a consultation for  evaluation of AKI on CKD.  Problems: 1. AKI on CKD stage IV / renal transplant - AKI likely due to sepsis/hemodynamic changes related with hypertensive urgency on admission and then hypotension after NTG IV (SBP to 50s). Baseline creatinine 2.08-2.40.  CT scan with unremarkable transplant kidney. UA with proteinuria. Creat worsened from 1.8 on admit >>> up to 3.40 yest and CRRT started on 12/15 due to uremia/ AMS. Labs improving, and confusion improving, more cooperative today. Order is in to [facility] for [facility] HD.   2. History of kidney transplant x2 , last in 2005: Continue Prograf 1 mg twice a day and Cellcept 500 bid. Currently on higher dose of prednisone to treat gout.  3. BP/ volume - BP's up some, 12 kg up from admit, diffuse LE / UE edema. Cont UF 200- 250 cc/hr net negative.  4. Acute metabolic encephalopathy: CT head negative. Prob uremia + ICU stay  +/- other. Improving.  5. Possible gout flare: Uric acid level elevated. On po steroid. 6. Hypertension:  Continue carvedilol/ hydralazine. Diuretics on hold. Monitor blood pressure.  Kelly Splinter, MD 09/04/2020, 3:18 PM    Subjective: UOP 600 cc y est.  Creat up slightly   Objective Vital signs in last 24 hours: Vitals:   09/04/20 1000 09/04/20 1100 09/04/20 1200 09/04/20 1300  BP: (!) 173/79 (!) 157/57 (!) 167/73 135/62  Pulse: 72 69 (!) 52 73  Resp: 14 16 15 17   Temp: (!) 97.34 F (36.3 C) (!) 97.34 F (36.3 C) (!) 97.34 F (36.3 C) (!) 97.52 F (36.4 C)  TempSrc:      SpO2: 100% 100% 100% 99%  Weight:      Height:       Weight change: -0.2 kg  Intake/Output Summary (Last 24 hours) at 09/04/2020 1518 Last data filed at 09/04/2020 1500 Gross per 24 hour  Intake 1738.3 ml  Output 4879 ml  Net -3140.7 ml       Labs: Basic Metabolic Panel: Recent Labs  Lab 09/03/20 0215 09/03/20 1638 09/04/20 0612  NA 139  141 137 137  137  K 4.5  4.5 4.3 5.0  5.0  CL 109  110 104 104  104  CO2 19*  19* 24 22  22   GLUCOSE 37*  38* 80 304*  301*  BUN 96*  92* 58* 36*  38*  CREATININE 2.29*  2.36* 1.43* 1.34*  1.34*  CALCIUM 8.8*  8.8* 8.7* 8.4*  8.4*  PHOS 2.8  2.8 2.1* 2.8  2.8   Liver Function Tests: Recent Labs  Lab 08/29/20 0243 08/30/20 0221 08/31/20 0244 09/01/20 0237 09/03/20 0215 09/03/20 1638 09/04/20 0612  AST 24 20 22   --   --   --   --   ALT 10 9 9   --   --   --   --   ALKPHOS 80 67 65  --   --   --   --   BILITOT 0.6 0.8 0.6  --   --   --   --   PROT 5.6* 5.2* 5.3*  --   --   --   --   ALBUMIN 3.2* 2.9* 2.7*   < > 3.0* 3.0* 2.9*   < > = values in this interval not displayed.   No results for input(s): LIPASE, AMYLASE in the last 168 hours. No results for input(s): AMMONIA in the last 168 hours. CBC: Recent Labs  Lab 08/31/20 0244 09/01/20 0237 09/02/20 0220 09/03/20 0215 09/04/20 0612  WBC 11.1* 10.0 6.9 7.4 7.5   NEUTROABS 10.0* 9.4* 6.3 6.3 6.5  HGB 8.2* 7.8* 8.2* 8.6* 8.7*  HCT 28.3* 27.0* 28.0* 28.8* 29.3*  MCV 95.0 96.4 94.0 94.1 94.5  PLT 76* 87* 107* 194 166   Cardiac Enzymes: No results for input(s): CKTOTAL, CKMB, CKMBINDEX, TROPONINI in the last 168 hours. CBG: Recent Labs  Lab 09/03/20 1557 09/03/20 2108 09/04/20 0412 09/04/20 0724 09/04/20 1139  GLUCAP 62* 221* 304* 230* 210*    Iron Studies: No results for input(s): IRON, TIBC, TRANSFERRIN, FERRITIN in the last 72 hours. Studies/Results: No results found.  Medications: Infusions: .  prismasol BGK 4/2.5 400 mL/hr at 09/04/20 1029  .  prismasol BGK 4/2.5 200 mL/hr at 09/02/20 2008  . sodium chloride    . sodium chloride Stopped (09/02/20 1017)  . dextrose 50 mL/hr at 09/04/20 1500  . nitroGLYCERIN Stopped (08/29/20 0846)  . prismasol BGK 4/2.5 1,800 mL/hr at 09/04/20 1455    Scheduled Medications: . (feeding supplement) PROSource Plus  30 mL Oral BID BM  . allopurinol  100 mg Oral Daily  . aspirin EC  162 mg Oral BID  . atorvastatin  10 mg Oral Daily  . calcitRIOL  0.25 mcg Oral Daily  . carvedilol  12.5 mg Oral BID WC  . Chlorhexidine Gluconate Cloth  6 each Topical Daily  . feeding supplement (GLUCERNA SHAKE)  237 mL Oral BID BM  . heparin  5,000 Units Subcutaneous Q8H  . hydrALAZINE  25 mg Oral TID  . insulin aspart  0-5 Units Subcutaneous QHS  . insulin aspart  0-9 Units Subcutaneous TID WC  . insulin aspart  10 Units Subcutaneous TID WC  . insulin glargine  18 Units Subcutaneous Daily  . latanoprost  1 drop Both Eyes QHS  . linagliptin  5 mg Oral Daily  . mouth rinse  15 mL Mouth Rinse BID  . multivitamin with minerals  1 tablet Oral Daily  . mycophenolate  500 mg Oral BID  . nutrition supplement (JUVEN)  1 packet Oral BID BM  . predniSONE  50 mg Oral Daily  . sodium bicarbonate  650 mg Oral TID  . sodium chloride flush  3 mL Intravenous Q12H  . sodium chloride flush  3 mL Intravenous Q12H  .  tacrolimus  1 mg Oral BID    have reviewed scheduled and prn medications.

## 2020-09-04 NOTE — TOC Progression Note (Signed)
Transition of Care [facility]) - Progression Note    Patient Details  Name: [name] MRN: [id number] Date of Birth: 1945-12-08  [facility]) CM/SW Contact  Leeroy Cha, RN Phone Number: 09/04/2020, 10:15 AM  Clinical Narrative:    Aggressive , hitting at the staff placed in wrist restriants and posey belt.  For possible transfer to [place] for HD.   Expected Discharge Plan: Home/Self Care Barriers to Discharge: No Barriers Identified  Expected Discharge Plan and Services Expected Discharge Plan: Home/Self Care   Discharge Planning Services: CM Consult   Living arrangements for the past 2 months: Single Family Home                                       Social Determinants of Health (SDOH) Interventions    Readmission Risk Interventions No flowsheet data found.

## 2020-09-04 NOTE — Progress Notes (Signed)
PROGRESS NOTE    [name]  MRN:[id number] DOB: 10/02/1945 DOA: 08/26/2020 PCP: Coladonato, Joseph, MD   Brief Narrative:  This 74 yrs old male with PMH of renal transplant x 2 (1983, 2005), CKD stage IIIb ( Baseline Cr 2.08-2.2) , chronic diastolic CHF(EF 40-45%), essential hypertension, diabetes type 2  with complication diabetic nephropathy, chronic left foot wound presented in the emergency department with confusion. Patient was found to be febrile on arrival to ED. Noncontrast head CT negative for acute intracranial abnormality. Chest x-ray notable for cardiomegaly with mild diffuse interstitial edema.  X-ray left foot shows soft tissue swelling around left fifth MTP without acute bony abnormality.  Lactic acid 1.9 with mild leukocytosis and thrombocytopenia. Patient was admitted for sepsis secondary to left foot cellulitis. Orthopedics thinks patient's left foot pain is secondary to gout,  not osteomyelitis. Patient was hypertensive on arrival and became hypotensive after getting nitroglycerin IV with systolic BP in 50. BP stable now. Patient's serum creatinine worsened to 3.40 on 12/14, CRRT ( continuous renal replacement therapy) started due to uremic symptoms(AMS). Labs improving but patient is still confused.  Nephrology recommended transfer to [facility] for regular hemodialysis.    Assessment & Plan:   Principal Problem:   Sepsis due to cellulitis (HCC) Active Problems:   Kidney transplant status, cadaveric   Essential hypertension   Acute encephalopathy   Anemia due to chronic kidney disease   Diabetes mellitus type 2, insulin dependent (HCC)   Chronic kidney disease, stage 3b (HCC)   Hypertensive emergency   Anemia, unspecified   Chronic renal failure, stage 3b (HCC)   Renal transplant, status post   Diabetes mellitus type 2, controlled, with complications (HCC)   Diabetic nephropathy (HCC)   Pressure injury of skin   Malnutrition of moderate degree   Idiopathic  chronic gout of multiple sites without tophus   Systolic and diastolic CHF, acute (HCC)   Sepsis sec. to Left foot cellulitis : -  Patient met sepsis criteria on admission,  temp > 38 C, RR> 20, point of infection cellulitis bilateral lower extremity and probable Osteomyelitis.  -Continued on IV antibiotics ( Ceftriaxone DC 12/14) - Blood cultures  No growth so far. - MRI LEFT foot; poor MRI secondary to motion artifact however infection, and erosions of metatarsals bone. - Lactic acid 1.9  procalcitonin 2.17 -  Discussed case with Dr. Duda orthopedics, states  patient's lower extremity pain is sec to his gout and no surgical intervention warranted. -  Sepsis physiology resolved.   Acute encephalopathycould be sec to uremia: - Head CT negative and no focal neuro deficits identified. - RPR, TSH, vitamin B12, ammonia normal. - Hold All sedating medication except for gabapentin. -  Patient continued on CRRT, labs improving but continued to be confused. -  Patient has periods of agitation and restlessness when he abuses staff. -  Patient was placed on soft restraints and belly belt. -  Continue Haldol as needed for agitation and restlessness.  Renal transplant /CKD stage IIIb (baseline Cr ~1.85) - Renally dose all medications. - Avoid nephrotoxic medications. -  Continue CellCept and prograf. - Increased Prednisone 50 mg daily for possible Gout Flare. (Normal dose 5 mg daily)   - Continue to hold diuretics and IV fluids.  Anemia chronic disease (baseline HgB 10.9) -Most likely secondary to CKD, anemia of chronic disease -Anemia panel; consistent with anemia of chronic disease -Fecal occult blood pending.  Hypertensive urgency >>> resolved.  Acute Systolic and Diastolic CHF -12/10   Echocardiogram LVEF= 40 to 74%, with diastolic dysfunction. -12/13 per cardiology advise if ischemic evaluation is needed, but this would not be scheduled until his general medical condition  improves.     Chest pain >> Resolved.  Hypotension >> Improved --12/10 Albumin 25g  X 1   Pleural effusion - Moderate pleural effusion seen on echocardiogram. -12/12 PCXR; small effusion and pulmonary congestion.  DM type II controlled with complication /DM nephropathy -12/8 hemoglobin A1c= 6.6 - She had hypoglycemic episodes twice last night. - Decrease Lantus to 18 units  -Sensitive SSI - Held Tradjenta 5 mg daily  Gout -12/9 Uric acid = 10.7  -When patient's renal function begins to improve ,  will start colchicine -Other than his LEFT FIFTH METATARSAL NO JOINT PAIN. -Continue prednisone for his renal transplant; and gout flare.  Hypomagnesemia >> Improved -Magnesium goal>2  Hyperkalemia: >>>> Resolved.  -5/14 Lokelma 10 g x 2 dose  Bilateral arm pain chronic Bilateral upper extremity  Duplex : Negative for DVT.  Goals of care -12/12 PT/OT consult; evaluate patient for placement to SNF VS CIR.      DVT prophylaxis:  Heparin sq Code Status:  Full code Family Communication: No one at bed side. Disposition Plan:   Status is: Inpatient  Remains inpatient appropriate because:Inpatient level of care appropriate due to severity of illness   Dispo: The patient is from: Home              Anticipated d/c is to: TBD              Anticipated d/c date is: > 3 days              Patient currently is not medically stable to d/c.   Consultants:   Cardiology  Orthopedics surgery  Nephrology  Procedures:  Temporary Femoral line for HD.   Antimicrobials:  Anti-infectives (From admission, onward)   Start     Dose/Rate Route Frequency Ordered Stop   08/26/20 2359  cefTRIAXone (ROCEPHIN) 1 g in sodium chloride 0.9 % 100 mL IVPB        1 g 200 mL/hr over 30 Minutes Intravenous Every 24 hours 08/26/20 2028 09/02/20 0141   08/26/20 1545  ceFEPIme (MAXIPIME) 2 g in sodium chloride 0.9 % 100 mL IVPB        2 g 200 mL/hr over 30 Minutes Intravenous  Once  08/26/20 1532 08/26/20 1631   08/26/20 1545  metroNIDAZOLE (FLAGYL) tablet 500 mg  Status:  Discontinued        500 mg Oral Every 8 hours 08/26/20 1532 08/26/20 2015   08/26/20 1545  vancomycin (VANCOCIN) IVPB 1000 mg/200 mL premix        1,000 mg 200 mL/hr over 60 Minutes Intravenous  Once 08/26/20 1532 08/26/20 1823      Subjective: Patient was seen and examined at bedside.  Overnight events noted,  Patient was started on CRRT 12/14.   Patient continued to remain confused. He is alert and oriented x 2 but has intermittent agitation and restlessness. He was trying to come out of the bed,  He was placed on soft restraints and belly belt.  Objective: Vitals:   09/04/20 0742 09/04/20 0800 09/04/20 0900 09/04/20 1000  BP:  (!) 163/81 (!) 168/87 (!) 173/79  Pulse:  71 71 72  Resp: 17 (!) _0 Temp: (!) 97.34 F (36.3 C) (!) 97.34 F (36.3 C) (!) 97.52 F (36.4 C) (!) 97.34 F (36.3 C)  TempSrc:  SpO2:  100% 100% 100%  Weight:      Height:        Intake/Output Summary (Last 24 hours) at 09/04/2020 1139 Last data filed at 09/04/2020 1100 Gross per 24 hour  Intake 1538.3 ml  Output 4642 ml  Net -3103.7 ml   Filed Weights   09/02/20 0500 09/03/20 0500 09/04/20 0437  Weight: 93.4 kg 93.4 kg 93.2 kg    Examination:  General exam: Appears calm and comfortable, not in any distress. Respiratory system: Clear to auscultation. Respiratory effort normal. Cardiovascular system: S1 & S2 heard, RRR. No JVD, murmurs, rubs, gallops or clicks. No pedal edema. Gastrointestinal system: Abdomen is nondistended, soft and nontender. No organomegaly or masses felt.  Normal bowel sounds heard. Central nervous system: Alert and oriented x 2. No focal neurological deficits. Extremities: No edema, no cyanosis, no clubbing. Skin: Multiple bilateral lower extremity lesions, decreased erythema. Psychiatry: Judgement and insight appear normal. Mood & affect appropriate.     Data  Reviewed: I have personally reviewed following labs and imaging studies  CBC: Recent Labs  Lab 08/31/20 0244 09/01/20 0237 09/02/20 0220 09/03/20 0215 09/04/20 0612  WBC 11.1* 10.0 6.9 7.4 7.5  NEUTROABS 10.0* 9.4* 6.3 6.3 6.5  HGB 8.2* 7.8* 8.2* 8.6* 8.7*  HCT 28.3* 27.0* 28.0* 28.8* 29.3*  MCV 95.0 96.4 94.0 94.1 94.5  PLT 76* 87* 107* 194 166   Basic Metabolic Panel: Recent Labs  Lab 08/31/20 0244 09/01/20 0237 09/02/20 0220 09/02/20 2113 09/03/20 0215 09/03/20 1638 09/04/20 0612  NA 142 135 133* 137 139  141 137 137  137  K 4.3 4.6 5.2* 4.8 4.5  4.5 4.3 5.0  5.0  CL 113* 107 105 108 109  110 104 104  104  CO2 17* 18* 16* 17* 19*  19* 24 22  22  GLUCOSE 319* 185* 253* 74 37*  38* 80 304*  301*  BUN 69* 92* 87* 113* 96*  92* 58* 36*  38*  CREATININE 3.03* 3.27* 3.40* 2.90* 2.29*  2.36* 1.43* 1.34*  1.34*  CALCIUM 8.7* 8.5* 8.5* 9.0 8.8*  8.8* 8.7* 8.4*  8.4*  MG 2.1 2.2 2.5*  --  2.5*  --  2.4  PHOS 2.8 3.4  3.4 3.6 3.6 2.8  2.8 2.1* 2.8  2.8   GFR: Estimated Creatinine Clearance: 54.4 mL/min (A) (by C-G formula based on SCr of 1.34 mg/dL (H)). Liver Function Tests: Recent Labs  Lab 08/29/20 0243 08/30/20 0221 08/31/20 0244 09/01/20 0237 09/02/20 2113 09/03/20 0215 09/03/20 1638 09/04/20 0612  AST 24 20 22  --   --   --   --   --   ALT 10 9 9  --   --   --   --   --   ALKPHOS 80 67 65  --   --   --   --   --   BILITOT 0.6 0.8 0.6  --   --   --   --   --   PROT 5.6* 5.2* 5.3*  --   --   --   --   --   ALBUMIN 3.2* 2.9* 2.7* 2.6* 3.1* 3.0* 3.0* 2.9*   No results for input(s): LIPASE, AMYLASE in the last 168 hours. No results for input(s): AMMONIA in the last 168 hours. Coagulation Profile: No results for input(s): INR, PROTIME in the last 168 hours. Cardiac Enzymes: No results for input(s): CKTOTAL, CKMB, CKMBINDEX, TROPONINI in the last 168 hours. BNP (last 3   results) No results for input(s): PROBNP in the last 8760  hours. HbA1C: No results for input(s): HGBA1C in the last 72 hours. CBG: Recent Labs  Lab 09/03/20 1523 09/03/20 1557 09/03/20 2108 09/04/20 0412 09/04/20 0724  GLUCAP 43* 62* 221* 304* 230*   Lipid Profile: No results for input(s): CHOL, HDL, LDLCALC, TRIG, CHOLHDL, LDLDIRECT in the last 72 hours. Thyroid Function Tests: No results for input(s): TSH, T4TOTAL, FREET4, T3FREE, THYROIDAB in the last 72 hours. Anemia Panel: No results for input(s): VITAMINB12, FOLATE, FERRITIN, TIBC, IRON, RETICCTPCT in the last 72 hours. Sepsis Labs: Recent Labs  Lab 08/28/20 1241 08/29/20 0243  PROCALCITON  --  2.17  LATICACIDVEN 1.2  --     Recent Results (from the past 240 hour(s))  Blood Culture (routine x 2)     Status: None   Collection Time: 08/26/20  3:30 PM   Specimen: BLOOD LEFT HAND  Result Value Ref Range Status   Specimen Description   Final    BLOOD LEFT HAND Performed at [facility], [address] [place], [place] [address]    Special Requests   Final    BOTTLES DRAWN AEROBIC AND ANAEROBIC Blood Culture adequate volume Performed at [facility], [address] [place], [place] [address]    Culture   Final    NO GROWTH 5 DAYS Performed at [facility], [address] [place], [place] [address]    Report Status 08/31/2020 FINAL  Final  Blood Culture (routine x 2)     Status: None   Collection Time: 08/26/20  3:30 PM   Specimen: BLOOD  Result Value Ref Range Status   Specimen Description   Final    BLOOD RIGHT ARM Performed at [facility], [address] [place], [place] [address]    Special Requests   Final    BOTTLES DRAWN AEROBIC AND ANAEROBIC Blood Culture results may not be optimal due to an inadequate volume of blood received in culture bottles Performed at [facility], [address] [place], [place] [address]    Culture   Final    NO GROWTH 5 DAYS Performed at [facility], [address] [place], [place] [address]    Report Status 08/31/2020 FINAL  Final  Urine culture     Status: Abnormal   Collection Time: 08/26/20  3:33 PM   Specimen: In/Out Cath Urine  Result Value Ref Range Status   Specimen Description   Final    IN/OUT CATH URINE Performed at [facility], [address] [place], [place] [address]    Special Requests   Final    NONE Performed at [facility], [address] [place], [place] [address]    Culture (A)  Final    <10,000 COLONIES/mL INSIGNIFICANT GROWTH Performed at [facility], [address] [place], [place] [address]    Report Status 08/28/2020 FINAL  Final  Resp Panel by RT-PCR (Flu A&B, Covid) In/Out Cath Urine     Status: None   Collection Time: 08/26/20  4:30 PM   Specimen: In/Out Cath Urine; Nasopharyngeal(NP) swabs in vial transport medium  Result Value Ref Range Status   SARS Coronavirus 2 by RT PCR NEGATIVE NEGATIVE Final    Comment: (NOTE) SARS-CoV-2 target nucleic acids are NOT DETECTED.  The SARS-CoV-2 RNA is generally detectable in upper respiratory specimens during the acute phase of infection. The lowest concentration of   SARS-CoV-2 viral copies this assay can detect is 138 copies/mL. A negative result does not preclude SARS-Cov-2 infection and should not be used as the sole basis for treatment or other patient management decisions. A negative result may occur with  improper specimen collection/handling, submission of specimen other than nasopharyngeal swab, presence of viral mutation(s) within the areas targeted by this assay, and inadequate number of viral copies(<138 copies/mL). A negative result must be combined with clinical observations, patient history, and epidemiological information. The expected result is Negative.  Fact Sheet for Patients:  EntrepreneurPulse.com.au  Fact Sheet for Healthcare Providers:   IncredibleEmployment.be  This test is no t yet approved or cleared by the [place] FDA and  has been authorized for detection and/or diagnosis of SARS-CoV-2 by FDA under an Emergency Use Authorization (EUA). This EUA will remain  in effect (meaning this test can be used) for the duration of the COVID-19 declaration under Section 564(b)(1) of the Act, 21 U.S.C.section 360bbb-3(b)(1), unless the authorization is terminated  or revoked sooner.       Influenza A by PCR NEGATIVE NEGATIVE Final   Influenza B by PCR NEGATIVE NEGATIVE Final    Comment: (NOTE) The Xpert Xpress SARS-CoV-2/FLU/RSV plus assay is intended as an aid in the diagnosis of influenza from Nasopharyngeal swab specimens and should not be used as a sole basis for treatment. Nasal washings and aspirates are unacceptable for Xpert Xpress SARS-CoV-2/FLU/RSV testing.  Fact Sheet for Patients: EntrepreneurPulse.com.au  Fact Sheet for Healthcare Providers: IncredibleEmployment.be  This test is not yet approved or cleared by the [place] FDA and has been authorized for detection and/or diagnosis of SARS-CoV-2 by FDA under an Emergency Use Authorization (EUA). This EUA will remain in effect (meaning this test can be used) for the duration of the COVID-19 declaration under Section 564(b)(1) of the Act, 21 U.S.C. section 360bbb-3(b)(1), unless the authorization is terminated or revoked.  Performed at [facility], [place] [address] [place], [place] [address]   MRSA PCR Screening     Status: None   Collection Time: 08/27/20 12:22 PM   Specimen: Nasal Mucosa; Nasopharyngeal  Result Value Ref Range Status   MRSA by PCR NEGATIVE NEGATIVE Final    Comment:        The GeneXpert MRSA Assay (FDA approved for NASAL specimens only), is one component of a comprehensive MRSA colonization surveillance program. It is not intended to diagnose  MRSA infection nor to guide or monitor treatment for MRSA infections. Performed at [facility], [place] [address], [place], [place] [address]     Radiology Studies: No results found.  Scheduled Meds: . (feeding supplement) PROSource Plus  30 mL Oral BID BM  . allopurinol  100 mg Oral Daily  . aspirin EC  162 mg Oral BID  . atorvastatin  10 mg Oral Daily  . calcitRIOL  0.25 mcg Oral Daily  . carvedilol  12.5 mg Oral BID WC  . Chlorhexidine Gluconate Cloth  6 each Topical Daily  . heparin  5,000 Units Subcutaneous Q8H  . hydrALAZINE  25 mg Oral TID  . insulin aspart  0-5 Units Subcutaneous QHS  . insulin aspart  0-9 Units Subcutaneous TID WC  . insulin aspart  10 Units Subcutaneous TID WC  . insulin glargine  18 Units Subcutaneous Daily  . latanoprost  1 drop Both Eyes QHS  . linagliptin  5 mg Oral Daily  . mouth rinse  15 mL Mouth Rinse BID  . multivitamin with  minerals  1 tablet Oral Daily  . mycophenolate  500 mg Oral BID  . nutrition supplement (JUVEN)  1 packet Oral BID BM  . predniSONE  50 mg Oral Daily  . sodium bicarbonate  650 mg Oral TID  . sodium chloride flush  3 mL Intravenous Q12H  . sodium chloride flush  3 mL Intravenous Q12H  . tacrolimus  1 mg Oral BID   Continuous Infusions: .  prismasol BGK 4/2.5 400 mL/hr at 09/04/20 1029  .  prismasol BGK 4/2.5 200 mL/hr at 09/02/20 2008  . sodium chloride    . sodium chloride Stopped (09/02/20 1017)  . dextrose 50 mL/hr at 09/04/20 1100  . nitroGLYCERIN Stopped (08/29/20 0846)  . prismasol BGK 4/2.5 1,800 mL/hr at 09/04/20 0904     LOS: 9 days    Time spent: 35 mins    Iylah Dworkin, MD [facility]   If 7PM-7AM, please contact night-coverage

## 2020-09-05 ENCOUNTER — Encounter (HOSPITAL_BASED_OUTPATIENT_CLINIC_OR_DEPARTMENT_OTHER): Payer: Medicare Other | Admitting: Internal Medicine

## 2020-09-05 LAB — CBC WITH DIFFERENTIAL/PLATELET
Abs Immature Granulocytes: 0.46 10*3/uL — ABNORMAL HIGH (ref 0.00–0.07)
Basophils Absolute: 0 10*3/uL (ref 0.0–0.1)
Basophils Relative: 0 %
Eosinophils Absolute: 0.1 10*3/uL (ref 0.0–0.5)
Eosinophils Relative: 1 %
HCT: 28.1 % — ABNORMAL LOW (ref 39.0–52.0)
Hemoglobin: 8.5 g/dL — ABNORMAL LOW (ref 13.0–17.0)
Immature Granulocytes: 5 %
Lymphocytes Relative: 11 %
Lymphs Abs: 1.1 10*3/uL (ref 0.7–4.0)
MCH: 28.1 pg (ref 26.0–34.0)
MCHC: 30.2 g/dL (ref 30.0–36.0)
MCV: 92.7 fL (ref 80.0–100.0)
Monocytes Absolute: 1.2 10*3/uL — ABNORMAL HIGH (ref 0.1–1.0)
Monocytes Relative: 12 %
Neutro Abs: 7.2 10*3/uL (ref 1.7–7.7)
Neutrophils Relative %: 71 %
Platelets: 216 10*3/uL (ref 150–400)
RBC: 3.03 MIL/uL — ABNORMAL LOW (ref 4.22–5.81)
RDW: 14.4 % (ref 11.5–15.5)
WBC: 10 10*3/uL (ref 4.0–10.5)
nRBC: 0 % (ref 0.0–0.2)

## 2020-09-05 LAB — CBC
HCT: 26.1 % — ABNORMAL LOW (ref 39.0–52.0)
Hemoglobin: 8.1 g/dL — ABNORMAL LOW (ref 13.0–17.0)
MCH: 28.1 pg (ref 26.0–34.0)
MCHC: 31 g/dL (ref 30.0–36.0)
MCV: 90.6 fL (ref 80.0–100.0)
Platelets: 211 10*3/uL (ref 150–400)
RBC: 2.88 MIL/uL — ABNORMAL LOW (ref 4.22–5.81)
RDW: 14.3 % (ref 11.5–15.5)
WBC: 9.2 10*3/uL (ref 4.0–10.5)
nRBC: 0.2 % (ref 0.0–0.2)

## 2020-09-05 LAB — GLUCOSE, CAPILLARY
Glucose-Capillary: 157 mg/dL — ABNORMAL HIGH (ref 70–99)
Glucose-Capillary: 291 mg/dL — ABNORMAL HIGH (ref 70–99)
Glucose-Capillary: 221 mg/dL — ABNORMAL HIGH (ref 70–99)
Glucose-Capillary: 123 mg/dL — ABNORMAL HIGH (ref 70–99)

## 2020-09-05 MED ORDER — SODIUM CHLORIDE 0.9 % IV SOLN
1.0000 mg | Freq: Once | INTRAVENOUS | Status: AC
  Administered 2020-09-05: 19:00:00 1 mg via INTRAVENOUS
  Filled 2020-09-05: qty 0.2

## 2020-09-05 MED ORDER — K PHOS MONO-SOD PHOS DI & MONO 155-852-130 MG PO TABS
250.0000 mg | ORAL_TABLET | Freq: Three times a day (TID) | ORAL | Status: DC
  Administered 2020-09-05 – 2020-09-06 (×4): 250 mg via ORAL
  Filled 2020-09-05 (×7): qty 1

## 2020-09-05 MED ORDER — HALOPERIDOL LACTATE 5 MG/ML IJ SOLN
1.0000 mg | Freq: Once | INTRAMUSCULAR | Status: AC
  Administered 2020-09-05: 19:00:00 1 mg via INTRAVENOUS

## 2020-09-05 MED ORDER — HALOPERIDOL LACTATE 5 MG/ML IJ SOLN
INTRAMUSCULAR | Status: AC
  Filled 2020-09-05: qty 1

## 2020-09-05 MED ORDER — HYDRALAZINE HCL 20 MG/ML IJ SOLN: 10.0000 mg | Freq: Three times a day (TID) | INTRAMUSCULAR | Status: DC | PRN

## 2020-09-05 MED ORDER — HALOPERIDOL LACTATE 5 MG/ML IJ SOLN
2.0000 mg | Freq: Four times a day (QID) | INTRAMUSCULAR | Status: DC | PRN
  Administered 2020-09-05: 2 mg via INTRAVENOUS
  Filled 2020-09-05: qty 1

## 2020-09-05 MED ORDER — HYDRALAZINE HCL 50 MG PO TABS
50.0000 mg | ORAL_TABLET | Freq: Three times a day (TID) | ORAL | Status: DC
  Administered 2020-09-05 – 2020-09-07 (×7): 50 mg via ORAL
  Filled 2020-09-05 (×8): qty 1

## 2020-09-05 MED ORDER — THIAMINE HCL 100 MG/ML IJ SOLN
100.0000 mg | Freq: Every day | INTRAMUSCULAR | Status: DC
  Administered 2020-09-06 – 2020-09-08 (×3): 100 mg via INTRAVENOUS
  Filled 2020-09-05 (×3): qty 2

## 2020-09-05 MED ORDER — HYDRALAZINE HCL 20 MG/ML IJ SOLN
5.0000 mg | Freq: Three times a day (TID) | INTRAMUSCULAR | Status: DC | PRN
  Filled 2020-09-05: qty 1

## 2020-09-05 NOTE — Plan of Care (Signed)
  Problem: Education: Goal: Knowledge of General Education information will improve Description: Including pain rating scale, medication(s)/side effects and non-pharmacologic comfort measures Outcome: Not Progressing   Problem: Health Behavior/Discharge Planning: Goal: Ability to manage health-related needs will improve Outcome: Not Progressing   Problem: Clinical Measurements: Goal: Respiratory complications will improve Outcome: Progressing   Problem: Nutrition: Goal: Adequate nutrition will be maintained Outcome: Not Progressing   Problem: Safety: Goal: Non-violent Restraint(s) Outcome: Not Progressing

## 2020-09-05 NOTE — Progress Notes (Signed)
PROGRESS NOTE    [name]  KMQ:[id number] DOB: 07/26/1946 DOA: 08/26/2020 PCP: [name], MD   Brief Narrative:  This 74 yrs old male with PMH of renal transplant x 2 (1983, 2005), CKD stage IIIb ( Baseline Cr 2.08-2.2) , chronic diastolic CHF(EF 16-57%), essential hypertension, diabetes type 2  with complication diabetic nephropathy, chronic left foot wound presented in the emergency department with confusion. Patient was found to be febrile on arrival to ED. Noncontrast head CT negative for acute intracranial abnormality. Chest x-ray notable for cardiomegaly with mild diffuse interstitial edema.  X-ray left foot shows soft tissue swelling around left fifth MTP without acute bony abnormality.  Lactic acid 1.9 with mild leukocytosis and thrombocytopenia. Patient was admitted for sepsis secondary to left foot cellulitis. Orthopedics thinks patient's left foot pain is secondary to gout,  not osteomyelitis. Patient was hypertensive on arrival and became hypotensive after getting nitroglycerin IV with systolic BP in 50. BP stable now. Patient's serum creatinine worsened to 3.40 on 12/14, CRRT ( continuous renal replacement therapy) started due to uremic symptoms(AMS). Labs improving but patient is still confused.  Nephrology recommended transfer to [facility] for intermittent hemodialysis.    Assessment & Plan:   Principal Problem:   Sepsis due to cellulitis [name]) Active Problems:   Kidney transplant status, cadaveric   Essential hypertension   Acute encephalopathy   Anemia due to chronic kidney disease   Diabetes mellitus type 2, insulin dependent (HCC)   Chronic kidney disease, stage 3b (HCC)   Hypertensive emergency   Anemia, unspecified   Chronic renal failure, stage 3b ([place])   Renal transplant, status post   Diabetes mellitus type 2, controlled, with complications ([place])   Diabetic nephropathy ([place])   Pressure injury of skin   Malnutrition of moderate degree    Idiopathic chronic gout of multiple sites without tophus   Systolic and diastolic CHF, acute (HCC)   Sepsis sec. to Left foot cellulitis : -  Patient met sepsis criteria on admission,  temp > 38 C, RR> 20, point of infection cellulitis bilateral lower extremity and probable Osteomyelitis.  -Continued on IV antibiotics ( Ceftriaxone DC 12/14) - Blood cultures  No growth so far. - MRI LEFT foot; poor MRI secondary to motion artifact however infection, and erosions of metatarsals bone. - Lactic acid 1.9  procalcitonin 2.17 -  Discussed case with Dr. Sharol Given orthopedics, states  patient's lower extremity pain is sec to acute gout and no surgical intervention warranted. -  Sepsis physiology resolved.   Acute encephalopathycould be sec to uremia: - Head CT negative and no focal neuro deficits identified. - RPR, TSH, vitamin B12, ammonia normal. - Hold All sedating medication except for gabapentin. -  Patient continued on CRRT, labs improving but continued to be confused. -  Patient has periods of agitation and restlessness when he abuses staff. -  Patient was placed on soft restraints and belly belt. -  Continue Haldol as needed for agitation and restlessness. -  Patient is being more alert and oriented x 2, more cooperative today.  Renal transplant /CKD stage IIIb (baseline Cr ~1.85) - Renally dose all medications. - Avoid nephrotoxic medications. - Continue CellCept and prograf. - Increased Prednisone 50 mg daily for possible Gout Flare. (Normal dose 5 mg daily)   -Continue to hold diuretics and IV fluids. - Renal functions improving as per nephrology,  will discontinue CRRT today. - Awaiting transfer to [facility] for intermittent hemodialysis.  Anemia chronic disease (baseline HgB 10.9) -  Most likely secondary to CKD, anemia of chronic disease -Anemia panel; consistent with anemia of chronic disease -Fecal occult blood pending.  Hypertensive urgency >>>Improving. Hydralazine as  needed.  Acute Systolic and Diastolic CHF -03/49 Echocardiogram LVEF= 40 to 17%, with diastolic dysfunction. -12/13 per cardiology advise if ischemic evaluation is needed, but this would not be scheduled until his general medical condition improves.     Chest pain >> Resolved.  Hypotension >> Improved --12/10 Albumin 25g  X 1   Pleural effusion - Moderate pleural effusion seen on echocardiogram. -12/12 PCXR; small effusion and pulmonary congestion.  DM type II controlled with complication /DM nephropathy -12/8 hemoglobin A1c= 6.6 - He had hypoglycemic episodes - Decreased Lantus dose to 18 units  - Sensitive SSI - Held Tradjenta 5 mg daily  Gout -12/9 Uric acid = 10.7  -When patient's renal function begins to improve ,  will start colchicine -Other than his LEFT FIFTH METATARSAL NO JOINT PAIN. -Continue prednisone for his renal transplant; and gout flare.  Hypomagnesemia >> Improved -Magnesium goal>2  Hyperkalemia: >>>> Resolved.  -5/14 Lokelma 10 g x 2 dose  Bilateral arm pain chronic Bilateral upper extremity  Duplex : Negative for DVT.  Goals of care -12/12 PT/OT consult; evaluate patient for placement to SNF VS CIR.      DVT prophylaxis:  Heparin sq Code Status:  Full code Family Communication: No one at bed side. Disposition Plan:   Status is: Inpatient  Remains inpatient appropriate because:Inpatient level of care appropriate due to severity of illness   Dispo: The patient is from: Home              Anticipated d/c is to: TBD              Anticipated d/c date is: > 3 days              Patient currently is not medically stable to d/c.   Consultants:   Cardiology  Orthopedics surgery  Nephrology  Procedures:  Temporary Femoral line for HD.   Antimicrobials:  Anti-infectives (From admission, onward)   Start     Dose/Rate Route Frequency Ordered Stop   08/26/20 2359  cefTRIAXone (ROCEPHIN) 1 g in sodium chloride 0.9 % 100 mL IVPB         1 g 200 mL/hr over 30 Minutes Intravenous Every 24 hours 08/26/20 2028 09/02/20 0141   08/26/20 1545  ceFEPIme (MAXIPIME) 2 g in sodium chloride 0.9 % 100 mL IVPB        2 g 200 mL/hr over 30 Minutes Intravenous  Once 08/26/20 1532 08/26/20 1631   08/26/20 1545  metroNIDAZOLE (FLAGYL) tablet 500 mg  Status:  Discontinued        500 mg Oral Every 8 hours 08/26/20 1532 08/26/20 2015   08/26/20 1545  vancomycin (VANCOCIN) IVPB 1000 mg/200 mL premix        1,000 mg 200 mL/hr over 60 Minutes Intravenous  Once 08/26/20 1532 08/26/20 1823      Subjective: Patient was seen and examined at bedside.  Overnight events noted,  Patient was started on CRRT 12/14.   He is more alert and oriented x 2, cooperative , following commands.  Patient renal functions are improving. Patient still remains on CRRT,  awaiting transfer to [facility] for intermittent hemodialysis.   Objective: Vitals:   09/05/20 0700 09/05/20 0800 09/05/20 0837 09/05/20 0900  BP: (!) 144/76 (!) 174/87  (!) 171/104  Pulse: (!) 45 73  73  Resp: _0 Temp: (!) 97.16 F (36.2 C) (!) 97.34 F (36.3 C) 98 F (36.7 C) (!) 97.52 F (36.4 C)  TempSrc:  Bladder Axillary   SpO2: 91% 100%  99%  Weight:      Height:        Intake/Output Summary (Last 24 hours) at 09/05/2020 1327 Last data filed at 09/05/2020 0825 Gross per 24 hour  Intake 1491.83 ml  Output 4478 ml  Net -2986.17 ml   Filed Weights   09/03/20 0500 09/04/20 0437 09/05/20 0600  Weight: 93.4 kg 93.2 kg 83.9 kg    Examination:  General exam: Appears calm and comfortable, not in any distress. Respiratory system: Clear to auscultation. Respiratory effort normal. Cardiovascular system: S1 & S2 heard, RRR. No JVD, murmurs, rubs, gallops or clicks. No pedal edema. Gastrointestinal system: Abdomen is nondistended, soft and nontender. No organomegaly or masses felt.  Normal bowel sounds heard. Central nervous system: Alert and oriented x 2. No focal  neurological deficits. Extremities: Femoral line noted for hemodialysis, no edema, no cyanosis, no clubbing. Skin: Multiple bilateral lower extremity lesions, decreased erythema. Psychiatry: Judgement and insight appear normal. Mood & affect appropriate.     Data Reviewed: I have personally reviewed following labs and imaging studies  CBC: Recent Labs  Lab 09/01/20 0237 09/02/20 0220 09/03/20 0215 09/04/20 0612 09/05/20 0835  WBC 10.0 6.9 7.4 7.5 10.0  NEUTROABS 9.4* 6.3 6.3 6.5 7.2  HGB 7.8* 8.2* 8.6* 8.7* 8.5*  HCT 27.0* 28.0* 28.8* 29.3* 28.1*  MCV 96.4 94.0 94.1 94.5 92.7  PLT 87* 107* 194 166 144   Basic Metabolic Panel: Recent Labs  Lab 08/31/20 0244 09/01/20 0237 09/02/20 0220 09/02/20 2113 09/03/20 0215 09/03/20 1638 09/04/20 0612 09/04/20 1618  NA 142 135 133* 137 139  141 137 137  137 136  K 4.3 4.6 5.2* 4.8 4.5  4.5 4.3 5.0  5.0 4.8  CL 113* 107 105 108 109  110 104 104  104 101  CO2 17* 18* 16* 17* 19*  19* _1 GLUCOSE 319* 185* 253* 74 37*  38* 80 304*  301* 186*  BUN 69* 92* 87* 113* 96*  92* 58* 36*  38* 35*  CREATININE 3.03* 3.27* 3.40* 2.90* 2.29*  2.36* 1.43* 1.34*  1.34* 1.32*  CALCIUM 8.7* 8.5* 8.5* 9.0 8.8*  8.8* 8.7* 8.4*  8.4* 8.6*  MG 2.1 2.2 2.5*  --  2.5*  --  2.4  --   PHOS 2.8 3.4  3.4 3.6 3.6 2.8  2.8 2.1* 2.8  2.8 1.8*   GFR: Estimated Creatinine Clearance: 52.6 mL/min (A) (by C-G formula based on SCr of 1.32 mg/dL (H)). Liver Function Tests: Recent Labs  Lab 08/30/20 0221 08/31/20 0244 09/01/20 0237 09/02/20 2113 09/03/20 0215 09/03/20 1638 09/04/20 0612 09/04/20 1618  AST 20 22  --   --   --   --   --   --   ALT 9 9  --   --   --   --   --   --   ALKPHOS 67 65  --   --   --   --   --   --   BILITOT 0.8 0.6  --   --   --   --   --   --   PROT 5.2* 5.3*  --   --   --   --   --   --   ALBUMIN  2.9* 2.7*   < > 3.1* 3.0* 3.0* 2.9* 3.2*   < > = values in this interval not displayed.   No results  for input(s): LIPASE, AMYLASE in the last 168 hours. No results for input(s): AMMONIA in the last 168 hours. Coagulation Profile: No results for input(s): INR, PROTIME in the last 168 hours. Cardiac Enzymes: No results for input(s): CKTOTAL, CKMB, CKMBINDEX, TROPONINI in the last 168 hours. BNP (last 3 results) No results for input(s): PROBNP in the last 8760 hours. HbA1C: No results for input(s): HGBA1C in the last 72 hours. CBG: Recent Labs  Lab 09/04/20 1139 09/04/20 1608 09/04/20 1948 09/05/20 0755 09/05/20 1213  GLUCAP 210* 181* 130* 157* 291*   Lipid Profile: No results for input(s): CHOL, HDL, LDLCALC, TRIG, CHOLHDL, LDLDIRECT in the last 72 hours. Thyroid Function Tests: No results for input(s): TSH, T4TOTAL, FREET4, T3FREE, THYROIDAB in the last 72 hours. Anemia Panel: No results for input(s): VITAMINB12, FOLATE, FERRITIN, TIBC, IRON, RETICCTPCT in the last 72 hours. Sepsis Labs: No results for input(s): PROCALCITON, LATICACIDVEN in the last 168 hours.  Recent Results (from the past 240 hour(s))  Blood Culture (routine x 2)     Status: None   Collection Time: 08/26/20  3:30 PM   Specimen: BLOOD LEFT HAND  Result Value Ref Range Status   Specimen Description   Final    BLOOD LEFT HAND Performed at [facility] [address], [place], [place] [address]    Special Requests   Final    BOTTLES DRAWN AEROBIC AND ANAEROBIC Blood Culture adequate volume Performed at [facility] [address], [place], [place] [address]    Culture   Final    NO GROWTH 5 DAYS Performed at [facility], [place] [address], [place], [place] [address]    Report Status 08/31/2020 FINAL  Final  Blood Culture (routine x 2)     Status: None   Collection Time: 08/26/20  3:30 PM   Specimen: BLOOD  Result Value Ref Range Status   Specimen Description   Final    BLOOD RIGHT ARM Performed at [facility] [address]  [place], [place] [address]    Special Requests   Final    BOTTLES DRAWN AEROBIC AND ANAEROBIC Blood Culture results may not be optimal due to an inadequate volume of blood received in culture bottles Performed at [facility] [address], [place], [place] [address]    Culture   Final    NO GROWTH 5 DAYS Performed at [facility], [place] [address], [place], [place] [address]    Report Status 08/31/2020 FINAL  Final  Urine culture     Status: Abnormal   Collection Time: 08/26/20  3:33 PM   Specimen: In/Out Cath Urine  Result Value Ref Range Status   Specimen Description   Final    IN/OUT CATH URINE Performed at [facility] [address], [place], [place] [address]    Special Requests   Final    NONE Performed at [facility], [place] [address], [place], [place] [address]    Culture (A)  Final    <10,000 COLONIES/mL INSIGNIFICANT GROWTH Performed at [facility] [address], [place], [place] [address]    Report Status 08/28/2020 FINAL  Final  Resp Panel by RT-PCR (Flu A&B, Covid) In/Out Cath Urine     Status: None   Collection Time: 08/26/20  4:30 PM   Specimen: In/Out Cath Urine; Nasopharyngeal(NP) swabs in vial transport medium  Result Value Ref Range Status   SARS Coronavirus 2 by RT PCR NEGATIVE NEGATIVE Final    Comment: (NOTE) SARS-CoV-2 target nucleic acids are NOT DETECTED.  The SARS-CoV-2 RNA is generally detectable in upper respiratory specimens during the acute phase of infection. The lowest concentration of SARS-CoV-2 viral copies this assay can detect is 138 copies/mL. A negative result does not preclude SARS-Cov-2 infection and should not be used as the sole basis for treatment or other patient management decisions. A negative result may occur with  improper specimen collection/handling, submission of specimen other than nasopharyngeal swab, presence of viral mutation(s) within the areas  targeted by this assay, and inadequate number of viral copies(<138 copies/mL). A negative result must be combined with clinical observations, patient history, and epidemiological information. The expected result is Negative.  Fact Sheet for Patients:  EntrepreneurPulse.com.au  Fact Sheet for Healthcare Providers:  IncredibleEmployment.be  This test is no t yet approved or cleared by the [place] FDA and  has been authorized for detection and/or diagnosis of SARS-CoV-2 by FDA under an Emergency Use Authorization (EUA). This EUA will remain  in effect (meaning this test can be used) for the duration of the COVID-19 declaration under Section 564(b)(1) of the Act, 21 U.S.C.section 360bbb-3(b)(1), unless the authorization is terminated  or revoked sooner.       Influenza A by PCR NEGATIVE NEGATIVE Final   Influenza B by PCR NEGATIVE NEGATIVE Final    Comment: (NOTE) The Xpert Xpress SARS-CoV-2/FLU/RSV plus assay is intended as an aid in the diagnosis of influenza from Nasopharyngeal swab specimens and should not be used as a sole basis for treatment. Nasal washings and aspirates are unacceptable for Xpert Xpress SARS-CoV-2/FLU/RSV testing.  Fact Sheet for Patients: EntrepreneurPulse.com.au  Fact Sheet for Healthcare Providers: IncredibleEmployment.be  This test is not yet approved or cleared by the [place] FDA and has been authorized for detection and/or diagnosis of SARS-CoV-2 by FDA under an Emergency Use Authorization (EUA). This EUA will remain in effect (meaning this test can be used) for the duration of the COVID-19 declaration under Section 564(b)(1) of the Act, 21 U.S.C. section 360bbb-3(b)(1), unless the authorization is terminated or revoked.  Performed at [facility], [place] [address], [place], [place] [address]   MRSA PCR Screening     Status: None   Collection  Time: 08/27/20 12:22 PM   Specimen: Nasal Mucosa; Nasopharyngeal  Result Value Ref Range Status   MRSA by PCR NEGATIVE NEGATIVE Final    Comment:        The GeneXpert MRSA Assay (FDA approved for NASAL specimens only), is one component of a comprehensive MRSA colonization surveillance program. It is not intended to diagnose MRSA infection nor to guide or monitor treatment for MRSA infections. Performed at [facility], [place] [address], [place], [place] [address]     Radiology Studies: No results found.  Scheduled Meds: . (feeding supplement) PROSource Plus  30 mL Oral BID BM  . allopurinol  100 mg Oral Daily  . aspirin EC  162 mg Oral BID  . atorvastatin  10 mg Oral Daily  . calcitRIOL  0.25 mcg Oral Daily  . carvedilol  12.5 mg Oral BID WC  . Chlorhexidine Gluconate Cloth  6 each Topical Daily  . feeding supplement (GLUCERNA SHAKE)  237 mL Oral BID BM  . heparin  5,000 Units Subcutaneous Q8H  .  hydrALAZINE  50 mg Oral TID  . insulin aspart  0-5 Units Subcutaneous QHS  . insulin aspart  0-9 Units Subcutaneous TID WC  . insulin aspart  10 Units Subcutaneous TID WC  . insulin glargine  18 Units Subcutaneous Daily  . latanoprost  1 drop Both Eyes QHS  . linagliptin  5 mg Oral Daily  . mouth rinse  15 mL Mouth Rinse BID  . multivitamin with minerals  1 tablet Oral Daily  . mycophenolate  500 mg Oral BID  . nutrition supplement (JUVEN)  1 packet Oral BID BM  . phosphorus  250 mg Oral TID  . predniSONE  50 mg Oral Daily  . sodium bicarbonate  650 mg Oral TID  . sodium chloride flush  3 mL Intravenous Q12H  . sodium chloride flush  3 mL Intravenous Q12H  . tacrolimus  1 mg Oral BID   Continuous Infusions: . sodium chloride    . dextrose 50 mL/hr at 09/05/20 0825  . nitroGLYCERIN Stopped (08/29/20 0846)     LOS: 10 days    Time spent: 35 mins     , MD [facility]   If 7PM-7AM, please contact night-coverage

## 2020-09-05 NOTE — Plan of Care (Signed)
  Problem: Education: Goal: Knowledge of General Education information will improve Description: Including pain rating scale, medication(s)/side effects and non-pharmacologic comfort measures Outcome: Progressing   Problem: Health Behavior/Discharge Planning: Goal: Ability to manage health-related needs will improve Outcome: Progressing   Problem: Clinical Measurements: Goal: Ability to maintain clinical measurements within normal limits will improve Outcome: Progressing Goal: Will remain free from infection Outcome: Progressing Goal: Diagnostic test results will improve Outcome: Progressing Goal: Respiratory complications will improve Outcome: Progressing Goal: Cardiovascular complication will be avoided Outcome: Progressing   Problem: Activity: Goal: Risk for activity intolerance will decrease Outcome: Progressing   Problem: Nutrition: Goal: Adequate nutrition will be maintained Outcome: Progressing   Problem: Coping: Goal: Level of anxiety will decrease Outcome: Progressing   Problem: Elimination: Goal: Will not experience complications related to bowel motility Outcome: Progressing Goal: Will not experience complications related to urinary retention Outcome: Progressing   Problem: Pain Managment: Goal: General experience of comfort will improve Outcome: Progressing   Problem: Safety: Goal: Ability to remain free from injury will improve Outcome: Progressing   Problem: Skin Integrity: Goal: Risk for impaired skin integrity will decrease Outcome: Progressing   Problem: Clinical Measurements: Goal: Ability to avoid or minimize complications of infection will improve Outcome: Progressing   Problem: Skin Integrity: Goal: Skin integrity will improve Outcome: Progressing   Problem: Safety: Goal: Non-violent Restraint(s) Outcome: Progressing

## 2020-09-05 NOTE — Progress Notes (Signed)
[facility] NEPHROLOGY PROGRESS NOTE   Subjective: UOP 200- 250 cc/day still. Net I/O neg 3.3 L yesterday. Net I/O since admit about even/ zero.     Physical Exam: General:NAD, comfortable, confused Heart:RRR, s1s2 nl Lungs:clear b/l, no crackle Abdomen:soft, Non-tender, non-distended Extremities: improved/ mostly resolved bilat LE/ UE edema Neurology: Alert , confused, thinks he is at the "grocery store", knows the year, mild asterixis on exam   CXR 12/12 - IMPRESSION: Small left pleural effusion.  Increased bibasilar opacities. Cardiomegaly and central pulmonary vascular congestion.     Home meds:   - hydralazine 25 tid/ lasix 80 qd/ coreg 6.25 bid  - prograf 1 bid/ cellcept 500 bid/ pred 5 qd  - percocet prn/ neurontin 100 bid prn  - lipotor 10 / asa 81  - zyloprim 100  - humalog ssi tid ac/ lantus 7-15 u per day  - prn's/ vitamins/ supplements  Summary: Pt is a 74 y.o. yo male  with history of hypertension, HLD, type II DM, CHF, A. fib, ESRD status post kidney transplant in 1983, 2005, CKD 4 with baseline creatinine level around 2..0-2.2, admitted with left foot infection, osteomyelitis, seen as a consultation for  evaluation of AKI on CKD.  Problems: 1. AKI on CKD stage IV / renal transplant - AKI likely due to sepsis/hemodynamic changes related with hypertensive urgency on admission and then hypotension after NTG IV (SBP to 50s). Baseline creatinine 2.08-2.40.  CT scan with unremarkable transplant kidney. UA with proteinuria. Creat worsened from 1.8 on admit >>> up to 3.40 and CRRT started on 12/15 due to uremia/ AMS. Vol excess/ azotemia resolved w/ CRRT, will dc and anticipate transfer to [facility] for iHD as needed.  2. History of kidney transplant x2 , last in 2005: on prograf, cellcept and prednisone at home. Continues here but currently on higher dose of prednisone for gout.  3. BP/ volume - BP's stable, vol overload mostly resolved, 3kg over admit wt today.    4. Acute metabolic encephalopathy: CT head negative.  Improving.  5. Possible gout flare: Uric acid level elevated. On po steroid. 6. Hypertension:  Getting po carvedilol/ hydralazine. BP's up, have ^'d hydralazine to 50 tid.   Kelly Splinter, MD 09/05/2020, 9:44 AM    Objective Vital signs in last 24 hours: Vitals:   09/05/20 0700 09/05/20 0800 09/05/20 0837 09/05/20 0900  BP: (!) 144/76 (!) 174/87  (!) 171/104  Pulse: (!) 45 73  73  Resp: 14 13  15   Temp: (!) 97.16 F (36.2 C)  98 F (36.7 C) (!) 97.52 F (36.4 C)  TempSrc:  Bladder Axillary   SpO2: 91% 100%  99%  Weight:      Height:       Weight change: -9.3 kg  Intake/Output Summary (Last 24 hours) at 09/05/2020 0944 Last data filed at 09/05/2020 0825 Gross per 24 hour  Intake 1911.83 ml  Output 5308 ml  Net -3396.17 ml       Labs: Basic Metabolic Panel: Recent Labs  Lab 09/03/20 1638 09/04/20 0612 09/04/20 1618  NA 137 137  137 136  K 4.3 5.0  5.0 4.8  CL 104 104  104 101  CO2 24 22  22 25   GLUCOSE 80 304*  301* 186*  BUN 58* 36*  38* 35*  CREATININE 1.43* 1.34*  1.34* 1.32*  CALCIUM 8.7* 8.4*  8.4* 8.6*  PHOS 2.1* 2.8  2.8 1.8*   Liver Function Tests: Recent Labs  Lab 08/30/20 0221 08/31/20  9562 09/01/20 0237 09/03/20 1638 09/04/20 0612 09/04/20 1618  AST 20 22  --   --   --   --   ALT 9 9  --   --   --   --   ALKPHOS 67 65  --   --   --   --   BILITOT 0.8 0.6  --   --   --   --   PROT 5.2* 5.3*  --   --   --   --   ALBUMIN 2.9* 2.7*   < > 3.0* 2.9* 3.2*   < > = values in this interval not displayed.   No results for input(s): LIPASE, AMYLASE in the last 168 hours. No results for input(s): AMMONIA in the last 168 hours. CBC: Recent Labs  Lab 09/01/20 0237 09/02/20 0220 09/03/20 0215 09/04/20 0612 09/05/20 0835  WBC 10.0 6.9 7.4 7.5 10.0  NEUTROABS 9.4* 6.3 6.3 6.5 7.2  HGB 7.8* 8.2* 8.6* 8.7* 8.5*  HCT 27.0* 28.0* 28.8* 29.3* 28.1*  MCV 96.4 94.0 94.1 94.5 92.7  PLT  87* 107* 194 166 216   Cardiac Enzymes: No results for input(s): CKTOTAL, CKMB, CKMBINDEX, TROPONINI in the last 168 hours. CBG: Recent Labs  Lab 09/04/20 0724 09/04/20 1139 09/04/20 1608 09/04/20 1948 09/05/20 0755  GLUCAP 230* 210* 181* 130* 157*    Iron Studies: No results for input(s): IRON, TIBC, TRANSFERRIN, FERRITIN in the last 72 hours. Studies/Results: No results found.  Medications: Infusions: . sodium chloride    . dextrose 50 mL/hr at 09/05/20 0825  . nitroGLYCERIN Stopped (08/29/20 0846)    Scheduled Medications: . (feeding supplement) PROSource Plus  30 mL Oral BID BM  . allopurinol  100 mg Oral Daily  . aspirin EC  162 mg Oral BID  . atorvastatin  10 mg Oral Daily  . calcitRIOL  0.25 mcg Oral Daily  . carvedilol  12.5 mg Oral BID WC  . Chlorhexidine Gluconate Cloth  6 each Topical Daily  . feeding supplement (GLUCERNA SHAKE)  237 mL Oral BID BM  . heparin  5,000 Units Subcutaneous Q8H  . hydrALAZINE  25 mg Oral TID  . insulin aspart  0-5 Units Subcutaneous QHS  . insulin aspart  0-9 Units Subcutaneous TID WC  . insulin aspart  10 Units Subcutaneous TID WC  . insulin glargine  18 Units Subcutaneous Daily  . latanoprost  1 drop Both Eyes QHS  . linagliptin  5 mg Oral Daily  . mouth rinse  15 mL Mouth Rinse BID  . multivitamin with minerals  1 tablet Oral Daily  . mycophenolate  500 mg Oral BID  . nutrition supplement (JUVEN)  1 packet Oral BID BM  . phosphorus  250 mg Oral TID  . predniSONE  50 mg Oral Daily  . sodium bicarbonate  650 mg Oral TID  . sodium chloride flush  3 mL Intravenous Q12H  . sodium chloride flush  3 mL Intravenous Q12H  . tacrolimus  1 mg Oral BID    have reviewed scheduled and prn medications.

## 2020-09-05 NOTE — Progress Notes (Signed)
OT Cancellation Note  Patient Details Name: [name] MRN: [id number] DOB: [birth year]   Cancelled Treatment:    Reason Eval/Treat Not Completed: Other (comment) patient confused and in restraints with likely [facility]. Will re-attempt at later date.  Delbert Phenix OT OT pager: [email or web address] 09/05/2020, 1:36 PM

## 2020-09-06 LAB — BASIC METABOLIC PANEL
Anion gap: 6 (ref 5–15)
BUN: 36 mg/dL — ABNORMAL HIGH (ref 8–23)
CO2: 27 mmol/L (ref 22–32)
Calcium: 8.6 mg/dL — ABNORMAL LOW (ref 8.9–10.3)
Chloride: 102 mmol/L (ref 98–111)
Creatinine, Ser: 1.84 mg/dL — ABNORMAL HIGH (ref 0.61–1.24)
GFR, Estimated: 38 mL/min — ABNORMAL LOW (ref >60)
Glucose, Bld: 84 mg/dL (ref 70–99)
Potassium: 4.3 mmol/L (ref 3.5–5.1)
Sodium: 135 mmol/L (ref 135–145)

## 2020-09-06 LAB — GLUCOSE, CAPILLARY
Glucose-Capillary: 93 mg/dL (ref 70–99)
Glucose-Capillary: 154 mg/dL — ABNORMAL HIGH (ref 70–99)
Glucose-Capillary: 319 mg/dL — ABNORMAL HIGH (ref 70–99)
Glucose-Capillary: 371 mg/dL — ABNORMAL HIGH (ref 70–99)

## 2020-09-06 LAB — CBC
HCT: 25.8 % — ABNORMAL LOW (ref 39.0–52.0)
Hemoglobin: 7.7 g/dL — ABNORMAL LOW (ref 13.0–17.0)
MCH: 27.5 pg (ref 26.0–34.0)
MCHC: 29.8 g/dL — ABNORMAL LOW (ref 30.0–36.0)
MCV: 92.1 fL (ref 80.0–100.0)
Platelets: 188 10*3/uL (ref 150–400)
RBC: 2.8 MIL/uL — ABNORMAL LOW (ref 4.22–5.81)
RDW: 14.5 % (ref 11.5–15.5)
WBC: 10.3 10*3/uL (ref 4.0–10.5)
nRBC: 0 % (ref 0.0–0.2)

## 2020-09-06 LAB — MAGNESIUM: Magnesium: 2.6 mg/dL — ABNORMAL HIGH (ref 1.7–2.4)

## 2020-09-06 LAB — AMMONIA: Ammonia: 14 umol/L (ref 9–35)

## 2020-09-06 LAB — PHOSPHORUS: Phosphorus: 3.2 mg/dL (ref 2.5–4.6)

## 2020-09-06 NOTE — Progress Notes (Signed)
Pt continuously screaming about someone being in his room trying to kill him. This RN tried multiple times to reorient him and provide reassurance that no one was in the room with him. Pt not able to be reoriented. MD on call notified and new orders received. Will continue to monitor.

## 2020-09-06 NOTE — Progress Notes (Addendum)
PROGRESS NOTE    [name]  DJS:[id number] DOB: Sep 14, 1946 DOA: 08/26/2020 PCP: [name], MD   Brief Narrative: 74 year old with past medical history significant for renal transplant x2 (1983, 2005), CKD stage IIIb (creatinine baseline 8.85-0.2), chronic systolic and diastolic heart failure ejection fraction 40 to 45%, hypertension, diabetes type 2 with complication of diabetic nephropathy, chronic left foot wound presented to the emergency department with confusion.  Patient was found to be febrile on arrival to the ED.  Noncontrast CT head negative for acute intracranial abnormalities.  Chest x-ray showed cardiomegaly with mild diffuse interstitial edema.  X-ray of the left foot show soft tissue swelling around the left fifth MTP without acute bony abnormality.  Lactic acid 1.9 with leukocytosis and thrombocytopenia.  Patient was admitted for sepsis secondary to left foot cellulitis.  Orthopedic think patient's left foot pain is secondary to gout.  No osteomyelitis.  Patient was hypertensive on arrival and became hypotensive after getting nitroglycerin drip with a systolic blood pressure running in the 50s.  Blood pressure now stable.  Patient serum creatinine worsened to 3.4 on 12/14.  He was a started on CRRT due to uremic symptoms.  Lab improving but patient continued to be confused.  Nephrology recommend transfer to [facility] for intermittent hemodialysis.     Assessment & Plan:   Principal Problem:   Sepsis due to cellulitis [facility]) Active Problems:   Kidney transplant status, cadaveric   Essential hypertension   Acute encephalopathy   Anemia due to chronic kidney disease   Diabetes mellitus type 2, insulin dependent (HCC)   Chronic kidney disease, stage 3b (HCC)   Hypertensive emergency   Anemia, unspecified   Chronic renal failure, stage 3b ([place])   Renal transplant, status post   Diabetes mellitus type 2, controlled, with complications ([place])   Diabetic  nephropathy ([place])   Pressure injury of skin   Malnutrition of moderate degree   Idiopathic chronic gout of multiple sites without tophus   Systolic and diastolic CHF, acute ([place])   1-Sepsis secondary to left foot cellulitis: -Patient presented with fever, tachypnea respiration more than 20 source of infection cellulitis of bilateral lower extremity. -Patient received IV antibiotics, 5 days of ceftriaxone -MRI of the left foot: Prominent erosion first M TP, base of the distal phalanx fourth toe, but no clear evidence of osteomyelitis.  No drainable fluid collection.  Circumferential edema in the forefoot, cellulitis not excluded. -Lactic acid 1.9, procalcitonin 2.1 -Dr. Dwyane Dee discussed with Dr. Sharol Given from orthopedic, patient lower extremity is secondary to acute gout and no surgical intervention is warranted. -Sepsis physiology resolved. -cellulitis resolved.   2-Metabolic encephalopathy; unclear etiology. -CT head negative. -MRI;  -RPR, TSH, vitamin B12 and ammonia normal. -Holding sedative -Patient received CRRT, labs improving but he continued to be confused. -Patient continues to be on soft restraint and belly belt due to high risk for fall and aggression.  -Continue with Haldol as needed -Patient usually is more alert cooperative during the mornings -Per nephrology, encephalopathy now related to uremia. -if no improvement he will need MRI brain.  -Added Thiamine and folic acid.   3-AKI on CKD stage IIIb, renal transplant x2 (1983, 2005 Nephrology following.  No plan for HD today.  Received CRRT during this admission.  Continue with Cellcept, prednisone, prograf.   Anemia of chronic kidney disease: Hemoglobin baseline 9. Monitor, hb down to 7.7  -Chest pain: Resolved. Hypertensive urgency: Improved On hydralazine,  Acute systolic and diastolic heart failure ejection fraction 40 to  45%. 12/13; Cardiology advised  ischemic evaluation is needed, but this will not be  scheduled until his general medical condition improved.  Hypotension: Related to medication resolved  Pleural effusion: Moderate pleural effusion seen on echocardiogram.  Diabetes type 2  with hypoglycemia Hold  on Lantus, continue with a sliding scale insulin, hold Tradjenta.  Discontinue scheduled meal coverage Patient on D10 currently. Refuse to eat.   Gout: Uric acid at 10. Continue with prednisone  Hypomagnesemia: Improved Hyperkalemia: Resolved Bilateral arm pain chronic: Dopplers negative.   Pressure Injury 08/27/20 Sacrum Posterior;Mid Stage 1 -  Intact skin with non-blanchable redness of a localized area usually over a bony prominence. 2" X 1" small stage 1 sacral PI (Active)  08/27/20 1230  Location: Sacrum  Location Orientation: Posterior;Mid  Staging: Stage 1 -  Intact skin with non-blanchable redness of a localized area usually over a bony prominence.  Wound Description (Comments): 2" X 1" small stage 1 sacral PI  Present on Admission: Yes     Nutrition Problem: Moderate Malnutrition Etiology: chronic illness (DM with neuropathy)    Signs/Symptoms: mild fat depletion,mild muscle depletion,moderate muscle depletion    Interventions: Juven,Prostat,MVI  Estimated body mass index is 26.68 kg/m as calculated from the following:   Height as of this encounter: 5\' 8"  (1.727 m).   Weight as of this encounter: 79.6 kg.   DVT prophylaxis: Heparin Code Status: Full code Family Communication: Discussed with patient Disposition Plan:  Status is: Inpatient  Remains inpatient appropriate because:Unsafe d/c plan, still with confusion. Encephalopathy    Dispo: The patient is from: Home              Anticipated d/c is to: To be determined PT ordered              Anticipated d/c date is: 3 days              Patient currently is not medically stable to d/c.        Consultants:   Nephrology  Neurology  Procedures:   CRRT  Antimicrobials:   None  Subjective: He is alert, told me he is [place]/ he was able to tell me his name. Refuse to eat. He doesn't like the food.  Yesterday he was having some hallucination.  He would like to see Dr. Marval Regal.   Objective: Vitals:   09/06/20 0300 09/06/20 0442 09/06/20 0800 09/06/20 1100  BP: 116/65  106/67 134/68  Pulse: 66  70 (!) 58  Resp: (!) 21  12 11   Temp: 98.1 F (36.7 C)  98.3 F (36.8 C)   TempSrc: Oral  Oral   SpO2: 94%  96% 94%  Weight:  79.6 kg    Height:        Intake/Output Summary (Last 24 hours) at 09/06/2020 1134 Last data filed at 09/06/2020 0425 Gross per 24 hour  Intake --  Output 725 ml  Net -725 ml   Filed Weights   09/04/20 0437 09/05/20 0600 09/06/20 0442  Weight: 93.2 kg 83.9 kg 79.6 kg    Examination:  General exam: Appears calm and comfortable  Respiratory system: Clear to auscultation. Respiratory effort normal. Cardiovascular system: S1 & S2 heard, RRR. No JVD, murmurs, rubs, gallops or clicks. No pedal edema. Gastrointestinal system: Abdomen is nondistended, soft and nontender. No organomegaly or masses felt. Normal bowel sounds heard. Central nervous system: Alert and oriented to person, appears more calm this am.  Extremities: no edema   Data Reviewed: I have personally  reviewed following labs and imaging studies  CBC: Recent Labs  Lab 09/01/20 0237 09/02/20 0220 09/03/20 0215 09/04/20 0612 09/05/20 0835 09/05/20 1410 09/06/20 0355  WBC 10.0 6.9 7.4 7.5 10.0 9.2 10.3  NEUTROABS 9.4* 6.3 6.3 6.5 7.2  --   --   HGB 7.8* 8.2* 8.6* 8.7* 8.5* 8.1* 7.7*  HCT 27.0* 28.0* 28.8* 29.3* 28.1* 26.1* 25.8*  MCV 96.4 94.0 94.1 94.5 92.7 90.6 92.1  PLT 87* 107* 194 166 216 211 726   Basic Metabolic Panel: Recent Labs  Lab 09/01/20 0237 09/02/20 0220 09/02/20 2113 09/03/20 0215 09/03/20 1638 09/04/20 0612 09/04/20 1618 09/06/20 0355  NA 135 133*   < > 139  141 137 137  137 136 135  K 4.6 5.2*   < > 4.5  4.5 4.3 5.0   5.0 4.8 4.3  CL 107 105   < > 109  110 104 104  104 101 102  CO2 18* 16*   < > 19*  19* 24 22  22 25 27   GLUCOSE 185* 253*   < > 37*  38* 80 304*  301* 186* 84  BUN 92* 87*   < > 96*  92* 58* 36*  38* 35* 36*  CREATININE 3.27* 3.40*   < > 2.29*  2.36* 1.43* 1.34*  1.34* 1.32* 1.84*  CALCIUM 8.5* 8.5*   < > 8.8*  8.8* 8.7* 8.4*  8.4* 8.6* 8.6*  MG 2.2 2.5*  --  2.5*  --  2.4  --  2.6*  PHOS 3.4  3.4 3.6   < > 2.8  2.8 2.1* 2.8  2.8 1.8* 3.2   < > = values in this interval not displayed.   GFR: Estimated Creatinine Clearance: 34.6 mL/min (A) (by C-G formula based on SCr of 1.84 mg/dL (H)). Liver Function Tests: Recent Labs  Lab 08/31/20 0244 09/01/20 0237 09/02/20 2113 09/03/20 0215 09/03/20 1638 09/04/20 0612 09/04/20 1618  AST 22  --   --   --   --   --   --   ALT 9  --   --   --   --   --   --   ALKPHOS 65  --   --   --   --   --   --   BILITOT 0.6  --   --   --   --   --   --   PROT 5.3*  --   --   --   --   --   --   ALBUMIN 2.7*   < > 3.1* 3.0* 3.0* 2.9* 3.2*   < > = values in this interval not displayed.   No results for input(s): LIPASE, AMYLASE in the last 168 hours. Recent Labs  Lab 09/06/20 0355  AMMONIA 14   Coagulation Profile: No results for input(s): INR, PROTIME in the last 168 hours. Cardiac Enzymes: No results for input(s): CKTOTAL, CKMB, CKMBINDEX, TROPONINI in the last 168 hours. BNP (last 3 results) No results for input(s): PROBNP in the last 8760 hours. HbA1C: No results for input(s): HGBA1C in the last 72 hours. CBG: Recent Labs  Lab 09/05/20 0755 09/05/20 1213 09/05/20 1550 09/05/20 2156 09/06/20 0631  GLUCAP 157* 291* 221* 123* 93   Lipid Profile: No results for input(s): CHOL, HDL, LDLCALC, TRIG, CHOLHDL, LDLDIRECT in the last 72 hours. Thyroid Function Tests: No results for input(s): TSH, T4TOTAL, FREET4, T3FREE, THYROIDAB in the last 72 hours. Anemia Panel: No results for  input(s): VITAMINB12, FOLATE, FERRITIN, TIBC,  IRON, RETICCTPCT in the last 72 hours. Sepsis Labs: No results for input(s): PROCALCITON, LATICACIDVEN in the last 168 hours.  Recent Results (from the past 240 hour(s))  MRSA PCR Screening     Status: None   Collection Time: 08/27/20 12:22 PM   Specimen: Nasal Mucosa; Nasopharyngeal  Result Value Ref Range Status   MRSA by PCR NEGATIVE NEGATIVE Final    Comment:        The GeneXpert MRSA Assay (FDA approved for NASAL specimens only), is one component of a comprehensive MRSA colonization surveillance program. It is not intended to diagnose MRSA infection nor to guide or monitor treatment for MRSA infections. Performed at [facility], [place] [address], [place], [place] [address]          Radiology Studies: No results found.      Scheduled Meds: . (feeding supplement) PROSource Plus  30 mL Oral BID BM  . allopurinol  100 mg Oral Daily  . aspirin EC  162 mg Oral BID  . atorvastatin  10 mg Oral Daily  . calcitRIOL  0.25 mcg Oral Daily  . carvedilol  12.5 mg Oral BID WC  . Chlorhexidine Gluconate Cloth  6 each Topical Daily  . feeding supplement (GLUCERNA SHAKE)  237 mL Oral BID BM  . heparin  5,000 Units Subcutaneous Q8H  . hydrALAZINE  50 mg Oral TID  . insulin aspart  0-5 Units Subcutaneous QHS  . insulin aspart  0-9 Units Subcutaneous TID WC  . insulin aspart  10 Units Subcutaneous TID WC  . insulin glargine  18 Units Subcutaneous Daily  . latanoprost  1 drop Both Eyes QHS  . linagliptin  5 mg Oral Daily  . mouth rinse  15 mL Mouth Rinse BID  . multivitamin with minerals  1 tablet Oral Daily  . mycophenolate  500 mg Oral BID  . nutrition supplement (JUVEN)  1 packet Oral BID BM  . phosphorus  250 mg Oral TID  . predniSONE  50 mg Oral Daily  . sodium bicarbonate  650 mg Oral TID  . sodium chloride flush  3 mL Intravenous Q12H  . sodium chloride flush  3 mL Intravenous Q12H  . tacrolimus  1 mg Oral BID  . thiamine injection  100 mg  Intravenous Daily   Continuous Infusions: . sodium chloride    . dextrose 50 mL/hr at 09/05/20 0825  . nitroGLYCERIN Stopped (08/29/20 0846)     LOS: 11 days    Time spent: 35 minutes.     Elmarie Shiley, MD [facility]   If 7PM-7AM, please contact night-coverage [email or web address]  09/06/2020, 11:34 AM

## 2020-09-06 NOTE — Plan of Care (Signed)
  Problem: Education: Goal: Knowledge of General Education information will improve Description: Including pain rating scale, medication(s)/side effects and non-pharmacologic comfort measures Outcome: Progressing   Problem: Health Behavior/Discharge Planning: Goal: Ability to manage health-related needs will improve Outcome: Progressing   Problem: Clinical Measurements: Goal: Ability to maintain clinical measurements within normal limits will improve Outcome: Progressing Goal: Will remain free from infection Outcome: Progressing Goal: Diagnostic test results will improve Outcome: Progressing Goal: Respiratory complications will improve Outcome: Progressing Goal: Cardiovascular complication will be avoided Outcome: Progressing   Problem: Activity: Goal: Risk for activity intolerance will decrease Outcome: Progressing   Problem: Nutrition: Goal: Adequate nutrition will be maintained Outcome: Progressing   Problem: Coping: Goal: Level of anxiety will decrease Outcome: Progressing   Problem: Elimination: Goal: Will not experience complications related to bowel motility Outcome: Progressing Goal: Will not experience complications related to urinary retention Outcome: Progressing   Problem: Pain Managment: Goal: General experience of comfort will improve Outcome: Progressing   Problem: Safety: Goal: Ability to remain free from injury will improve Outcome: Progressing   Problem: Skin Integrity: Goal: Risk for impaired skin integrity will decrease Outcome: Progressing   Problem: Clinical Measurements: Goal: Ability to avoid or minimize complications of infection will improve Outcome: Progressing   Problem: Skin Integrity: Goal: Skin integrity will improve Outcome: Progressing   Problem: Safety: Goal: Non-violent Restraint(s) Outcome: Progressing

## 2020-09-06 NOTE — Progress Notes (Signed)
[facility] NEPHROLOGY PROGRESS NOTE   Subjective: UOP 200- 250 cc/day still. Net I/O neg 3.3 L yesterday. Net I/O since admit about even/ zero.     Physical Exam: General:NAD, comfortable, confused Heart:RRR, s1s2 nl Lungs:clear b/l, no crackle Abdomen:soft, Non-tender, non-distended Extremities: improved/ mostly resolved bilat LE/ UE edema Neurology: Alert , confused, thinks he is at the "grocery store", knows the year, mild asterixis on exam   CXR 12/12 - IMPRESSION: Small left pleural effusion.  Increased bibasilar opacities. Cardiomegaly and central pulmonary vascular congestion.     Home meds:   - hydralazine 25 tid/ lasix 80 qd/ coreg 6.25 bid  - prograf 1 bid/ cellcept 500 bid/ pred 5 qd  - percocet prn/ neurontin 100 bid prn  - lipotor 10 / asa 81  - zyloprim 100  - humalog ssi tid ac/ lantus 7-15 u per day  - prn's/ vitamins/ supplements    Wt's = admit 81kg/ peak 93kg on 12/14 > yest 83kg, today 79kg  Summary: Pt is a 74 y.o. yo male  with history of hypertension, HLD, type II DM, CHF, A. fib, ESRD status post kidney transplant in 1983, 2005, CKD 4 with baseline creatinine level around 2..0-2.2, admitted with left foot infection, osteomyelitis, seen as a consultation for  evaluation of AKI on CKD.  Problems: 1. AKI on CKD stage IV / renal transplant - AKI likely due to sepsis/hemodynamic changes related with hypertensive urgency on admission and then hypotension after NTG IV (SBP to 50s). Baseline creatinine 2.0-2.4.  CT scan with unremarkable transplant kidney. UA with proteinuria. Creat worsened from 1.8 on admit >>> up to 3.40 and CRRT started on 12/15 due to AMS w/ possibility of uremia. Vol excess improved and azotemia resolved w/ CRRT, pt tx'd to [facility] for [facility]HD if needed.  Today's labs are good, no need for HD.  Will follow.  2. AMS/ confusion - mild- moderate, in mittens due to pulling at HD line. With CRRT his renal labs corrected but his MS did not so  confusion is NOT due to uremia.  3. History of kidney transplant x2 , last in 2005: on prograf, cellcept and prednisone at home. Continuing here but currently on higher dose of prednisone for gout.  4. BP/ volume overload - BP's stable, vol overload mostly resolved, some residual UE edema. Lungs clear. 1-2 kg under admit wt.  5. Acute metabolic encephalopathy: CT head negative.  Improving.  6. Possible gout flare: Uric acid level elevated. On po steroid. 7. Hypertension:  Getting po carvedilol/ hydralazine. BP's down/ normal today.   Kelly Splinter, MD 09/06/2020, 11:31 AM    Objective Vital signs in last 24 hours: Vitals:   09/05/20 2305 09/06/20 0300 09/06/20 0442 09/06/20 0800  BP: (!) 151/77 116/65  106/67  Pulse: 70 66  70  Resp: 15 (!) 21  12  Temp: 98.6 F (37 C) 98.1 F (36.7 C)  98.3 F (36.8 C)  TempSrc: Oral Oral  Oral  SpO2: 98% 94%  96%  Weight:   79.6 kg   Height:       Weight change: -4.3 kg  Intake/Output Summary (Last 24 hours) at 09/06/2020 1131 Last data filed at 09/06/2020 0425 Gross per 24 hour  Intake --  Output 725 ml  Net -725 ml       Labs: Basic Metabolic Panel: Recent Labs  Lab 09/04/20 0612 09/04/20 1618 09/06/20 0355  NA 137  137 136 135  K 5.0  5.0 4.8 4.3  CL 104  104 101 102  CO2 22  22 25 27   GLUCOSE 304*  301* 186* 84  BUN 36*  38* 35* 36*  CREATININE 1.34*  1.34* 1.32* 1.84*  CALCIUM 8.4*  8.4* 8.6* 8.6*  PHOS 2.8  2.8 1.8* 3.2   Liver Function Tests: Recent Labs  Lab 08/31/20 0244 09/01/20 0237 09/03/20 1638 09/04/20 0612 09/04/20 1618  AST 22  --   --   --   --   ALT 9  --   --   --   --   ALKPHOS 65  --   --   --   --   BILITOT 0.6  --   --   --   --   PROT 5.3*  --   --   --   --   ALBUMIN 2.7*   < > 3.0* 2.9* 3.2*   < > = values in this interval not displayed.   No results for input(s): LIPASE, AMYLASE in the last 168 hours. Recent Labs  Lab 09/06/20 0355  AMMONIA 14   CBC: Recent Labs  Lab  09/03/20 0215 09/04/20 0612 09/05/20 0835 09/05/20 1410 09/06/20 0355  WBC 7.4 7.5 10.0 9.2 10.3  NEUTROABS 6.3 6.5 7.2  --   --   HGB 8.6* 8.7* 8.5* 8.1* 7.7*  HCT 28.8* 29.3* 28.1* 26.1* 25.8*  MCV 94.1 94.5 92.7 90.6 92.1  PLT 194 166 216 211 188   Cardiac Enzymes: No results for input(s): CKTOTAL, CKMB, CKMBINDEX, TROPONINI in the last 168 hours. CBG: Recent Labs  Lab 09/05/20 0755 09/05/20 1213 09/05/20 1550 09/05/20 2156 09/06/20 0631  GLUCAP 157* 291* 221* 123* 93    Iron Studies: No results for input(s): IRON, TIBC, TRANSFERRIN, FERRITIN in the last 72 hours. Studies/Results: No results found.  Medications: Infusions: . sodium chloride    . dextrose 50 mL/hr at 09/05/20 0825  . nitroGLYCERIN Stopped (08/29/20 0846)    Scheduled Medications: . (feeding supplement) PROSource Plus  30 mL Oral BID BM  . allopurinol  100 mg Oral Daily  . aspirin EC  162 mg Oral BID  . atorvastatin  10 mg Oral Daily  . calcitRIOL  0.25 mcg Oral Daily  . carvedilol  12.5 mg Oral BID WC  . Chlorhexidine Gluconate Cloth  6 each Topical Daily  . feeding supplement (GLUCERNA SHAKE)  237 mL Oral BID BM  . heparin  5,000 Units Subcutaneous Q8H  . hydrALAZINE  50 mg Oral TID  . insulin aspart  0-5 Units Subcutaneous QHS  . insulin aspart  0-9 Units Subcutaneous TID WC  . insulin aspart  10 Units Subcutaneous TID WC  . insulin glargine  18 Units Subcutaneous Daily  . latanoprost  1 drop Both Eyes QHS  . linagliptin  5 mg Oral Daily  . mouth rinse  15 mL Mouth Rinse BID  . multivitamin with minerals  1 tablet Oral Daily  . mycophenolate  500 mg Oral BID  . nutrition supplement (JUVEN)  1 packet Oral BID BM  . phosphorus  250 mg Oral TID  . predniSONE  50 mg Oral Daily  . sodium bicarbonate  650 mg Oral TID  . sodium chloride flush  3 mL Intravenous Q12H  . sodium chloride flush  3 mL Intravenous Q12H  . tacrolimus  1 mg Oral BID  . thiamine injection  100 mg Intravenous Daily     have reviewed scheduled and prn medications.

## 2020-09-07 LAB — GLUCOSE, CAPILLARY
Glucose-Capillary: 358 mg/dL — ABNORMAL HIGH (ref 70–99)
Glucose-Capillary: 316 mg/dL — ABNORMAL HIGH (ref 70–99)
Glucose-Capillary: 379 mg/dL — ABNORMAL HIGH (ref 70–99)
Glucose-Capillary: 430 mg/dL — ABNORMAL HIGH (ref 70–99)

## 2020-09-07 LAB — BASIC METABOLIC PANEL
Anion gap: 13 (ref 5–15)
BUN: 43 mg/dL — ABNORMAL HIGH (ref 8–23)
CO2: 20 mmol/L — ABNORMAL LOW (ref 22–32)
Calcium: 8.5 mg/dL — ABNORMAL LOW (ref 8.9–10.3)
Chloride: 97 mmol/L — ABNORMAL LOW (ref 98–111)
Creatinine, Ser: 2.14 mg/dL — ABNORMAL HIGH (ref 0.61–1.24)
GFR, Estimated: 32 mL/min — ABNORMAL LOW (ref >60)
Glucose, Bld: 383 mg/dL — ABNORMAL HIGH (ref 70–99)
Potassium: 4.8 mmol/L (ref 3.5–5.1)
Sodium: 130 mmol/L — ABNORMAL LOW (ref 135–145)

## 2020-09-07 LAB — CBC
HCT: 25.9 % — ABNORMAL LOW (ref 39.0–52.0)
Hemoglobin: 7.7 g/dL — ABNORMAL LOW (ref 13.0–17.0)
MCH: 27.1 pg (ref 26.0–34.0)
MCHC: 29.7 g/dL — ABNORMAL LOW (ref 30.0–36.0)
MCV: 91.2 fL (ref 80.0–100.0)
Platelets: 196 10*3/uL (ref 150–400)
RBC: 2.84 MIL/uL — ABNORMAL LOW (ref 4.22–5.81)
RDW: 14.5 % (ref 11.5–15.5)
WBC: 10 10*3/uL (ref 4.0–10.5)
nRBC: 0 % (ref 0.0–0.2)

## 2020-09-07 MED ORDER — CEPHALEXIN 500 MG PO CAPS
500.0000 mg | ORAL_CAPSULE | Freq: Two times a day (BID) | ORAL | Status: DC
  Administered 2020-09-07 – 2020-09-09 (×5): 500 mg via ORAL
  Filled 2020-09-07 (×6): qty 1

## 2020-09-07 MED ORDER — INSULIN ASPART 100 UNIT/ML ~~LOC~~ SOLN
2.0000 [IU] | Freq: Three times a day (TID) | SUBCUTANEOUS | Status: DC
  Administered 2020-09-07 – 2020-09-12 (×12): 2 [IU] via SUBCUTANEOUS

## 2020-09-07 MED ORDER — INSULIN DETEMIR 100 UNIT/ML ~~LOC~~ SOLN
18.0000 [IU] | Freq: Every day | SUBCUTANEOUS | Status: DC
  Administered 2020-09-07 – 2020-09-12 (×6): 18 [IU] via SUBCUTANEOUS
  Filled 2020-09-07 (×6): qty 0.18

## 2020-09-07 MED ORDER — INSULIN ASPART 100 UNIT/ML ~~LOC~~ SOLN
10.0000 [IU] | Freq: Once | SUBCUTANEOUS | Status: AC
  Administered 2020-09-07: 22:00:00 10 [IU] via SUBCUTANEOUS

## 2020-09-07 NOTE — Progress Notes (Signed)
[facility] NEPHROLOGY PROGRESS NOTE   Subjective: UOP sig better now w/ over 2000 cc out since midnight. Creat slightly up 2.1 today.    Physical Exam: General:NAD, comfortable, confused Heart:RRR, s1s2 nl Lungs:clear b/l, no crackle Abdomen:soft, Non-tender, non-distended Extremities: improved/ mostly resolved bilat LE/ UE edema Neurology: Alert , confused, thinks he is at the "grocery store", knows the year, mild asterixis on exam   CXR 12/12 - IMPRESSION: Small left pleural effusion.  Increased bibasilar opacities. Cardiomegaly and central pulmonary vascular congestion.     Home meds:   - hydralazine 25 tid/ lasix 80 qd/ coreg 6.25 bid  - prograf 1 bid/ cellcept 500 bid/ pred 5 qd  - percocet prn/ neurontin 100 bid prn  - lipotor 10 / asa 81  - zyloprim 100  - humalog ssi tid ac/ lantus 7-15 u per day  - prn's/ vitamins/ supplements    Wt's = admit 81kg/ peak 93kg on 12/14 > yest 83kg, today 79kg  Summary: Pt is a 74 y.o. yo male  with history of hypertension, HLD, type II DM, CHF, A. fib, ESRD status post kidney transplant in 1983, 2005, CKD 4 with baseline creatinine level around 2..0-2.2, admitted with left foot infection, osteomyelitis, seen as a consultation for  evaluation of AKI on CKD.  Problems: 1. AKI on CKD stage IV / renal transplant - AKI likely due to sepsis/hemodynamic changes related with hypertensive urgency on admission and then hypotension after NTG IV (SBP to 50s). Baseline creatinine 2.0-2.4.  CT scan with unremarkable transplant kidney. UA with proteinuria. Creat worsened from 1.8 on admit >>> up to 3.40 and CRRT started on 12/15 due to AMS w/ possibility of uremia. Vol excess improved and azotemia resolved w/ CRRT. CRRT dc'd on 12/17 and pt tx'd to [facility] for [facility]HD if needed.  Today UOP has suddenly improved, suspect recovery phase from ATN.  Creat up slightly 2.1, last HD was 12/17 (CRRT). Good chance that he will not need further HD. Cont to  follow, keep temp cath in for now.  2. AMS/ confusion - seems to be improving, looking much better today, walking w/ PT.  3. History of kidney transplant x2 , last in 2005: on prograf, cellcept and prednisone at home. Continuing here but currently on higher dose of prednisone for gout.  4. BP/ volume overload - BP's stable, some UE edema but down to admit weight. No need for diuretics, spont diuresis this am.  5. Acute metabolic encephalopathy: CT head negative.  Improving.  6. Possible gout flare: Uric acid level elevated. On po steroid. 7. Hypertension:  Getting po carvedilol/ hydralazine. BP's good today.   Kelly Splinter, MD 09/07/2020, 9:16 AM    Objective Vital signs in last 24 hours: Vitals:   09/06/20 2300 09/07/20 0300 09/07/20 0700 09/07/20 0800  BP: 123/66 (!) 108/56 (!) 167/84 123/66  Pulse: (!) 59 (!) 55 69 71  Resp: 17 20 17 15   Temp: 98.8 F (37.1 C) 98.1 F (36.7 C) 98.4 F (36.9 C) 98.5 F (36.9 C)  TempSrc: Oral Oral Oral Oral  SpO2: 98% 95% 95% 97%  Weight:  80 kg    Height:       Weight change: 0.4 kg  Intake/Output Summary (Last 24 hours) at 09/07/2020 0916 Last data filed at 09/07/2020 0800 Gross per 24 hour  Intake 720 ml  Output 2100 ml  Net -1380 ml       Labs: Basic Metabolic Panel: Recent Labs  Lab 09/04/20 0612  09/04/20 1618 09/06/20 0355 09/07/20 0810  NA 137  137 136 135 130*  K 5.0  5.0 4.8 4.3 4.8  CL 104  104 101 102 97*  CO2 22  22 25 27  20*  GLUCOSE 304*  301* 186* 84 383*  BUN 36*  38* 35* 36* 43*  CREATININE 1.34*  1.34* 1.32* 1.84* 2.14*  CALCIUM 8.4*  8.4* 8.6* 8.6* 8.5*  PHOS 2.8  2.8 1.8* 3.2  --    Liver Function Tests: Recent Labs  Lab 09/03/20 1638 09/04/20 0612 09/04/20 1618  ALBUMIN 3.0* 2.9* 3.2*   No results for input(s): LIPASE, AMYLASE in the last 168 hours. Recent Labs  Lab 09/06/20 0355  AMMONIA 14   CBC: Recent Labs  Lab 09/03/20 0215 09/04/20 0612 09/05/20 0835 09/05/20 1410  09/06/20 0355 09/07/20 0810  WBC 7.4 7.5 10.0 9.2 10.3 10.0  NEUTROABS 6.3 6.5 7.2  --   --   --   HGB 8.6* 8.7* 8.5* 8.1* 7.7* 7.7*  HCT 28.8* 29.3* 28.1* 26.1* 25.8* 25.9*  MCV 94.1 94.5 92.7 90.6 92.1 91.2  PLT 194 166 216 211 188 196   Cardiac Enzymes: No results for input(s): CKTOTAL, CKMB, CKMBINDEX, TROPONINI in the last 168 hours. CBG: Recent Labs  Lab 09/06/20 0631 09/06/20 1229 09/06/20 1620 09/06/20 2105 09/07/20 0639  GLUCAP 93 154* 319* 371* 358*    Iron Studies: No results for input(s): IRON, TIBC, TRANSFERRIN, FERRITIN in the last 72 hours. Studies/Results: No results found.  Medications: Infusions: . sodium chloride      Scheduled Medications: . (feeding supplement) PROSource Plus  30 mL Oral BID BM  . allopurinol  100 mg Oral Daily  . aspirin EC  162 mg Oral BID  . atorvastatin  10 mg Oral Daily  . calcitRIOL  0.25 mcg Oral Daily  . carvedilol  12.5 mg Oral BID WC  . Chlorhexidine Gluconate Cloth  6 each Topical Daily  . feeding supplement (GLUCERNA SHAKE)  237 mL Oral BID BM  . heparin  5,000 Units Subcutaneous Q8H  . hydrALAZINE  50 mg Oral TID  . insulin aspart  0-5 Units Subcutaneous QHS  . insulin aspart  0-9 Units Subcutaneous TID WC  . latanoprost  1 drop Both Eyes QHS  . mouth rinse  15 mL Mouth Rinse BID  . multivitamin with minerals  1 tablet Oral Daily  . mycophenolate  500 mg Oral BID  . nutrition supplement (JUVEN)  1 packet Oral BID BM  . predniSONE  50 mg Oral Daily  . sodium bicarbonate  650 mg Oral TID  . sodium chloride flush  3 mL Intravenous Q12H  . sodium chloride flush  3 mL Intravenous Q12H  . tacrolimus  1 mg Oral BID  . thiamine injection  100 mg Intravenous Daily    have reviewed scheduled and prn medications.

## 2020-09-07 NOTE — Evaluation (Signed)
Physical Therapy Evaluation Patient Details Name: [name] MRN: [id number] DOB: 08-24-1946 Today's Date: 09/07/2020   History of Present Illness  74 year old s/p confusion, CThead negative, l 5th MTP soft tissue swelling found and sepsis in L foot, met encepelopathy. PMHx: for renal transplant x2 (1983, 2005), CKD stage IIIb (creatinine baseline 6.27-0.3), chronic systolic and diastolic heart failure ejection fraction 40 to 45%, hypertension, DMT2, with complication of diabetic nephropathy, chronic left foot wound.  Clinical Impression  Pt with seemingly improved cognition and mobility. Requiring min assist for bed mobility/transfers, ambulating 100 feet with a walker at a min guard assist level. Demonstrates mild cognitive deficits (unsure of baseline), balance impairments, and deconditioning. Prior to admission, pt lives with his spouse and is independent. He also has daughters in [place]. Attempted to contact family with pt permission, but unable to reach. Will continue to follow acutely to progress mobility as tolerated.     Follow Up Recommendations Home health PT;Supervision/Assistance - 24 hour (if pt does not have 24 hr, may need SNF)    Equipment Recommendations  3in1 (PT)    Recommendations for Other Services       Precautions / Restrictions Precautions Precautions: Fall Restrictions Weight Bearing Restrictions: No      Mobility  Bed Mobility Overal bed mobility: Needs Assistance Bed Mobility: Supine to Sit     Supine to sit: Min assist     General bed mobility comments: MinA via handheld assist to boost to upright position.    Transfers Overall transfer level: Needs assistance Equipment used: Rolling walker (2 wheeled) Transfers: Sit to/from Stand Sit to Stand: Min assist         General transfer comment: MinA to rise from edge of bed and initially steady. Cues for hand positioning  Ambulation/Gait Ambulation/Gait assistance: Min guard Gait  Distance (Feet): 100 Feet Assistive device: Rolling walker (2 wheeled) Gait Pattern/deviations: Step-through pattern;Decreased stride length     General Gait Details: Cues provided for walker positioning and scapular depression, min guard for safety  Stairs            Wheelchair Mobility    Modified Rankin (Stroke Patients Only)       Balance Overall balance assessment: Needs assistance Sitting-balance support: Feet supported Sitting balance-Leahy Scale: Good     Standing balance support: Bilateral upper extremity supported Standing balance-Leahy Scale: Poor Standing balance comment: relies on external assist and support from RW and therapist                             Pertinent Vitals/Pain Pain Assessment: Faces Faces Pain Scale: Hurts a little bit Pain Location: left foot Pain Descriptors / Indicators: Sore;Guarding Pain Intervention(s): Monitored during session    Home Living Family/patient expects to be discharged to:: Private residence Living Arrangements: Spouse/significant other Available Help at Discharge: Family;Available 24 hours/day Type of Home: House Home Access: Stairs to enter Entrance Stairs-Rails: Can reach both Entrance Stairs-Number of Steps: 8 Home Layout: One level Home Equipment: Walker - 2 wheels      Prior Function Level of Independence: Needs assistance   Gait / Transfers Assistance Needed: ambulatory with RW  ADL's / Homemaking Assistance Needed: Independent        Hand Dominance   Dominant Hand: Right    Extremity/Trunk Assessment   Upper Extremity Assessment Upper Extremity Assessment: Defer to OT evaluation    Lower Extremity Assessment Lower Extremity Assessment: Overall WFL for tasks assessed  Communication   Communication: No difficulties  Cognition Arousal/Alertness: Awake/alert Behavior During Therapy: WFL for tasks assessed/performed Overall Cognitive Status: Impaired/Different from  baseline Area of Impairment: Safety/judgement                         Safety/Judgement: Decreased awareness of safety;Decreased awareness of deficits     General Comments: Pt with much improved cognition, with no agitation, very pleasant, calm, and cooperative throughout session. A&Ox4, mild difficulty with dual tasking during ambulation. Decreased awareness of safety/deficits, as pt stating he is able to get up by himself.      General Comments  72 HR, BP 123/66 supine, 146/80 sitting, SpO2 95%    Exercises     Assessment/Plan    PT Assessment Patient needs continued PT services  PT Problem List Decreased strength;Decreased mobility;Decreased safety awareness;Decreased coordination;Decreased knowledge of precautions;Decreased activity tolerance;Decreased cognition;Decreased balance;Decreased knowledge of use of DME       PT Treatment Interventions DME instruction;Therapeutic activities;Cognitive remediation;Gait training;Therapeutic exercise;Patient/family education;Functional mobility training;Balance training    PT Goals (Current goals can be found in the Care Plan section)  Acute Rehab PT Goals Patient Stated Goal: go home before Christmas PT Goal Formulation: With patient Time For Goal Achievement: 09/21/20 Potential to Achieve Goals: Good    Frequency Min 3X/week   Barriers to discharge Decreased caregiver support      Co-evaluation PT/OT/SLP Co-Evaluation/Treatment: Yes Reason for Co-Treatment: For patient/therapist safety;To address functional/ADL transfers PT goals addressed during session: Mobility/safety with mobility         AM-PAC PT "6 Clicks" Mobility  Outcome Measure Help needed turning from your back to your side while in a flat bed without using bedrails?: None Help needed moving from lying on your back to sitting on the side of a flat bed without using bedrails?: A Little Help needed moving to and from a bed to a chair (including a  wheelchair)?: A Little Help needed standing up from a chair using your arms (e.g., wheelchair or bedside chair)?: A Little Help needed to walk in hospital room?: A Little Help needed climbing 3-5 steps with a railing? : A Lot 6 Click Score: 18    End of Session Equipment Utilized During Treatment: Gait belt Activity Tolerance: Patient tolerated treatment well Patient left: in chair;with call bell/phone within reach;with chair alarm set Nurse Communication: Mobility status PT Visit Diagnosis: Muscle weakness (generalized) (M62.81);Difficulty in walking, not elsewhere classified (R26.2);Pain Pain - Right/Left: Left Pain - part of body: Ankle and joints of foot    Time: 0109-3235 PT Time Calculation (min) (ACUTE ONLY): 23 min   Charges:   PT Evaluation $PT Eval Moderate Complexity: 1 Mod          Wyona Almas, PT, DPT Acute Rehabilitation Services Pager [phone] Office [phone]   Deno Etienne 09/07/2020, 8:38 AM

## 2020-09-07 NOTE — Progress Notes (Addendum)
PROGRESS NOTE    [name]  BJS:[id number] DOB: 10-14-1945 DOA: 08/26/2020 PCP: [name], MD   Brief Narrative: 74 year old with past medical history significant for renal transplant x2 (1983, 2005), CKD stage IIIb (creatinine baseline 6.07-3.7), chronic systolic and diastolic heart failure ejection fraction 40 to 45%, hypertension, diabetes type 2 with complication of diabetic nephropathy, chronic left foot wound presented to the emergency department with confusion.  Patient was found to be febrile on arrival to the ED.  Noncontrast CT head negative for acute intracranial abnormalities.  Chest x-ray showed cardiomegaly with mild diffuse interstitial edema.  X-ray of the left foot show soft tissue swelling around the left fifth MTP without acute bony abnormality.  Lactic acid 1.9 with leukocytosis and thrombocytopenia.  Patient was admitted for sepsis secondary to left foot cellulitis.  Orthopedic think patient's left foot pain is secondary to gout.  No osteomyelitis.  Patient was hypertensive on arrival and became hypotensive after getting nitroglycerin drip with a systolic blood pressure running in the 50s.  Blood pressure now stable.  Patient serum creatinine worsened to 3.4 on 12/14.  He was a started on CRRT due to uremic symptoms.  Lab improving but patient continued to be confused.  Nephrology recommend transfer to [facility] for in case he required  intermittent hemodialysis.     Assessment & Plan:   Principal Problem:   Sepsis due to cellulitis [facility]) Active Problems:   Kidney transplant status, cadaveric   Essential hypertension   Acute encephalopathy   Anemia due to chronic kidney disease   Diabetes mellitus type 2, insulin dependent (HCC)   Chronic kidney disease, stage 3b (HCC)   Hypertensive emergency   Anemia, unspecified   Chronic renal failure, stage 3b ([place])   Renal transplant, status post   Diabetes mellitus type 2, controlled, with complications  ([place])   Diabetic nephropathy ([place])   Pressure injury of skin   Malnutrition of moderate degree   Idiopathic chronic gout of multiple sites without tophus   Systolic and diastolic CHF, acute ([place])   1-Sepsis secondary to left foot cellulitis: resolved.  -Patient presented with fever, tachypnea respiration more than 20 source of infection cellulitis of bilateral lower extremity. -Patient received IV antibiotics, 5 days of ceftriaxone -MRI of the left foot: Prominent erosion first M TP, base of the distal phalanx fourth toe, but no clear evidence of osteomyelitis.  No drainable fluid collection.  Circumferential edema in the forefoot, cellulitis not excluded. -Lactic acid 1.9, procalcitonin 2.1 -Dr. Dwyane Dee discussed with Dr. Sharol Given from orthopedic, patient lower extremity is secondary to acute gout and no surgical intervention is warranted. -Sepsis physiology resolved. -cellulitis improved, mild drainage form second toe, will add keflex. Wound care consulted   2-Metabolic encephalopathy; related to sepsis, metabolic.  -CT head negative. -RPR, TSH, vitamin B12 and ammonia normal. -Holding sedative -Patient received CRRT, labs improving but he continued to be confused. -Patient continues to be on soft restraint and belly belt due to high risk for fall and aggression.  -Continue with Haldol as needed -Per nephrology, encephalopathy not  related to uremia. -Added Thiamine and folic acid.  -Patient MS is improved. He is alert and conversant. He was able to ambulate today with PT>   3-AKI on CKD stage IIIb, renal transplant x2 (1983, 2005 Nephrology following. Cr baseline 1.2--2 No plan for HD for now.  Received CRRT during this admission.  Continue with Cellcept, prednisone, prograf.  Urine out put improved.  Cr increase to 2.  Anemia of chronic kidney disease: Hemoglobin baseline 9. Monitor, hb stable.   -Chest pain: Resolved. Hypertensive urgency: Improved On hydralazine, holder  parameter to prevent hypotension.   Acute systolic and diastolic heart failure ejection fraction 40 to 45%. 12/13; Cardiology advised  ischemic evaluation is needed, but this will not be scheduled until his general medical condition improved.  Hypotension: Related to medication resolved  Pleural effusion: Moderate pleural effusion seen on echocardiogram.  Diabetes type 2  with hypoglycemia Hold  on Lantus, continue with a sliding scale insulin, hold Tradjenta.  Discontinue scheduled meal coverage Hyperglycemia this am. Discontinue D 10. Resume levemir.   Gout: Uric acid at 10. Continue with prednisone, start taper.  Repeat uric acid.   Hypomagnesemia: Improved Hyperkalemia: Resolved Bilateral arm pain chronic: Dopplers negative.   Pressure Injury 08/27/20 Sacrum Posterior;Mid Stage 1 -  Intact skin with non-blanchable redness of a localized area usually over a bony prominence. 2" X 1" small stage 1 sacral PI (Active)  08/27/20 1230  Location: Sacrum  Location Orientation: Posterior;Mid  Staging: Stage 1 -  Intact skin with non-blanchable redness of a localized area usually over a bony prominence.  Wound Description (Comments): 2" X 1" small stage 1 sacral PI  Present on Admission: Yes     Nutrition Problem: Moderate Malnutrition Etiology: chronic illness (DM with neuropathy)    Signs/Symptoms: mild fat depletion,mild muscle depletion,moderate muscle depletion    Interventions: Juven,Prostat,MVI  Estimated body mass index is 26.82 kg/m as calculated from the following:   Height as of this encounter: 5\' 8"  (1.727 m).   Weight as of this encounter: 80 kg.   DVT prophylaxis: Heparin Code Status: Full code Family Communication: Discussed with patient, will update daughter today  Disposition Plan:  Status is: Inpatient  Remains inpatient appropriate because:Unsafe d/c plan, still with confusion. Encephalopathy    Dispo: The patient is from: Home               Anticipated d/c is to: To be determined PT ordered              Anticipated d/c date is: 3 days              Patient currently is not medically stable to d/c.        Consultants:   Nephrology  Neurology  Procedures:   CRRT  Antimicrobials:  None  Subjective: He is alert and oriented times 3, conversant. He denies pain.   Objective: Vitals:   09/06/20 2300 09/07/20 0300 09/07/20 0700 09/07/20 0800  BP: 123/66 (!) 108/56 (!) 167/84 123/66  Pulse: (!) 59 (!) 55 69 71  Resp: 17 20 17 15   Temp: 98.8 F (37.1 C) 98.1 F (36.7 C) 98.4 F (36.9 C) 98.5 F (36.9 C)  TempSrc: Oral Oral Oral Oral  SpO2: 98% 95% 95% 97%  Weight:  80 kg    Height:        Intake/Output Summary (Last 24 hours) at 09/07/2020 1044 Last data filed at 09/07/2020 0800 Gross per 24 hour  Intake 720 ml  Output 2100 ml  Net -1380 ml   Filed Weights   09/05/20 0600 09/06/20 0442 09/07/20 0300  Weight: 83.9 kg 79.6 kg 80 kg    Examination:  General exam: NAD Respiratory system: CTA Cardiovascular system: S 1, S 2 RRR Gastrointestinal system: BS present, soft, nt Central nervous system: alert, following command, non focal.  Extremities: no edema   Data Reviewed: I have personally reviewed following labs  and imaging studies  CBC: Recent Labs  Lab 09/01/20 0237 09/02/20 0220 09/03/20 0215 09/04/20 0612 09/05/20 0835 09/05/20 1410 09/06/20 0355 09/07/20 0810  WBC 10.0 6.9 7.4 7.5 10.0 9.2 10.3 10.0  NEUTROABS 9.4* 6.3 6.3 6.5 7.2  --   --   --   HGB 7.8* 8.2* 8.6* 8.7* 8.5* 8.1* 7.7* 7.7*  HCT 27.0* 28.0* 28.8* 29.3* 28.1* 26.1* 25.8* 25.9*  MCV 96.4 94.0 94.1 94.5 92.7 90.6 92.1 91.2  PLT 87* 107* 194 166 216 211 188 751   Basic Metabolic Panel: Recent Labs  Lab 09/01/20 0237 09/02/20 0220 09/02/20 2113 09/03/20 0215 09/03/20 1638 09/04/20 0612 09/04/20 1618 09/06/20 0355 09/07/20 0810  NA 135 133*   < > 139  141 137 137  137 136 135 130*  K 4.6 5.2*   < > 4.5   4.5 4.3 5.0  5.0 4.8 4.3 4.8  CL 107 105   < > 109  110 104 104  104 101 102 97*  CO2 18* 16*   < > 19*  19* 24 22  22 25 27  20*  GLUCOSE 185* 253*   < > 37*  38* 80 304*  301* 186* 84 383*  BUN 92* 87*   < > 96*  92* 58* 36*  38* 35* 36* 43*  CREATININE 3.27* 3.40*   < > 2.29*  2.36* 1.43* 1.34*  1.34* 1.32* 1.84* 2.14*  CALCIUM 8.5* 8.5*   < > 8.8*  8.8* 8.7* 8.4*  8.4* 8.6* 8.6* 8.5*  MG 2.2 2.5*  --  2.5*  --  2.4  --  2.6*  --   PHOS 3.4  3.4 3.6   < > 2.8  2.8 2.1* 2.8  2.8 1.8* 3.2  --    < > = values in this interval not displayed.   GFR: Estimated Creatinine Clearance: 29.7 mL/min (A) (by C-G formula based on SCr of 2.14 mg/dL (H)). Liver Function Tests: Recent Labs  Lab 09/02/20 2113 09/03/20 0215 09/03/20 1638 09/04/20 0612 09/04/20 1618  ALBUMIN 3.1* 3.0* 3.0* 2.9* 3.2*   No results for input(s): LIPASE, AMYLASE in the last 168 hours. Recent Labs  Lab 09/06/20 0355  AMMONIA 14   Coagulation Profile: No results for input(s): INR, PROTIME in the last 168 hours. Cardiac Enzymes: No results for input(s): CKTOTAL, CKMB, CKMBINDEX, TROPONINI in the last 168 hours. BNP (last 3 results) No results for input(s): PROBNP in the last 8760 hours. HbA1C: No results for input(s): HGBA1C in the last 72 hours. CBG: Recent Labs  Lab 09/06/20 0631 09/06/20 1229 09/06/20 1620 09/06/20 2105 09/07/20 0639  GLUCAP 93 154* 319* 371* 358*   Lipid Profile: No results for input(s): CHOL, HDL, LDLCALC, TRIG, CHOLHDL, LDLDIRECT in the last 72 hours. Thyroid Function Tests: No results for input(s): TSH, T4TOTAL, FREET4, T3FREE, THYROIDAB in the last 72 hours. Anemia Panel: No results for input(s): VITAMINB12, FOLATE, FERRITIN, TIBC, IRON, RETICCTPCT in the last 72 hours. Sepsis Labs: No results for input(s): PROCALCITON, LATICACIDVEN in the last 168 hours.  No results found for this or any previous visit (from the past 240 hour(s)).       Radiology  Studies: No results found.      Scheduled Meds: . (feeding supplement) PROSource Plus  30 mL Oral BID BM  . allopurinol  100 mg Oral Daily  . aspirin EC  162 mg Oral BID  . atorvastatin  10 mg Oral Daily  . calcitRIOL  0.25 mcg Oral  Daily  . carvedilol  12.5 mg Oral BID WC  . Chlorhexidine Gluconate Cloth  6 each Topical Daily  . feeding supplement (GLUCERNA SHAKE)  237 mL Oral BID BM  . heparin  5,000 Units Subcutaneous Q8H  . hydrALAZINE  50 mg Oral TID  . insulin aspart  0-5 Units Subcutaneous QHS  . insulin aspart  0-9 Units Subcutaneous TID WC  . insulin detemir  18 Units Subcutaneous Daily  . latanoprost  1 drop Both Eyes QHS  . mouth rinse  15 mL Mouth Rinse BID  . multivitamin with minerals  1 tablet Oral Daily  . mycophenolate  500 mg Oral BID  . nutrition supplement (JUVEN)  1 packet Oral BID BM  . predniSONE  50 mg Oral Daily  . sodium bicarbonate  650 mg Oral TID  . sodium chloride flush  3 mL Intravenous Q12H  . sodium chloride flush  3 mL Intravenous Q12H  . tacrolimus  1 mg Oral BID  . thiamine injection  100 mg Intravenous Daily   Continuous Infusions: . sodium chloride       LOS: 12 days    Time spent: 35 minutes.     Elmarie Shiley, MD [facility]   If 7PM-7AM, please contact night-coverage [email or web address]  09/07/2020, 10:44 AM

## 2020-09-07 NOTE — Evaluation (Signed)
Occupational Therapy Evaluation Patient Details Name: [name] MRN: [id number] DOB: Dec 27, 1945 Today's Date: 09/07/2020    History of Present Illness 74 year old s/p confusion, CThead negative, l 5th MTP soft tissue swelling found and sepsis in L foot, met encepelopathy. PMHx: for renal transplant x2 (1983, 2005), CKD stage IIIb (creatinine baseline 3.76-2.8), chronic systolic and diastolic heart failure ejection fraction 40 to 45%, hypertension, DMT2, with complication of diabetic nephropathy, chronic left foot wound.   Clinical Impression   Pt PTA: Pt was independent prior to recent hospitalizations; lives with spouse who is also disabled. Pt's daughter lives in [place] and is willing to assist pt 24/7 during her Holiday break. Pt currently, pt minguardA overall for ADL and minA for sit to stand for mobility and bed mobility. Pt minguardA in hallway for mobility. Pt with poor insight into deficits and safety at times, needs reminders for safety. Pt requires increased time for multi tasking with cognitive task while ambulating in hallway.  VSS. Pt would benefit from continued OT skilled services for ADL, mobility and safety in [facility] setting if 24/7 available; SNF may be required if 24/7 unavailable at time of d/c. OT following acutely.       Follow Up Recommendations  Home health OT;Supervision/Assistance - 24 hour (If 24/7 not available, recommend SNF)    Equipment Recommendations  3 in 1 bedside commode    Recommendations for Other Services       Precautions / Restrictions Precautions Precautions: Fall Restrictions Weight Bearing Restrictions: No      Mobility Bed Mobility Overal bed mobility: Needs Assistance Bed Mobility: Supine to Sit     Supine to sit: Min assist     General bed mobility comments: MinA via handheld assist to boost to upright position.    Transfers Overall transfer level: Needs assistance Equipment used: Rolling walker (2  wheeled) Transfers: Sit to/from Stand Sit to Stand: Min assist         General transfer comment: MinA to rise from edge of bed and initially steady. Cues for hand positioning    Balance Overall balance assessment: Needs assistance Sitting-balance support: Feet supported Sitting balance-Leahy Scale: Good     Standing balance support: Bilateral upper extremity supported Standing balance-Leahy Scale: Poor Standing balance comment: relies on external assist and support from RW and therapist                           ADL either performed or assessed with clinical judgement   ADL Overall ADL's : Needs assistance/impaired Eating/Feeding: Set up;Sitting   Grooming: Min guard;Standing Grooming Details (indicate cue type and reason): for 2 mins at sink Upper Body Bathing: Set up;Sitting   Lower Body Bathing: Min guard;Sitting/lateral leans;Sit to/from stand   Upper Body Dressing : Set up;Sitting   Lower Body Dressing: Min guard;Sitting/lateral leans;Sit to/from stand   Toilet Transfer: Ambulation;RW;Min Psychiatric nurse Details (indicate cue type and reason): minguardA for safety Toileting- Clothing Manipulation and Hygiene: Min guard;Sitting/lateral lean;Sit to/from stand       Functional mobility during ADLs: Min guard;Rolling walker;Cueing for safety General ADL Comments: Pt limited by decreased insight into weakness for sit to stands and semi-poor standing tolerance.     Vision Baseline Vision/History: Wears glasses Wears Glasses: Reading only Patient Visual Report: No change from baseline Vision Assessment?: No apparent visual deficits Additional Comments: Able to read clock and menu with no difficulty.     Perception     Praxis  Pertinent Vitals/Pain Pain Assessment: Faces Faces Pain Scale: Hurts a little bit Pain Location: left foot Pain Descriptors / Indicators: Sore;Guarding Pain Intervention(s): Monitored during session     Hand  Dominance Right   Extremity/Trunk Assessment Upper Extremity Assessment Upper Extremity Assessment: Overall WFL for tasks assessed   Lower Extremity Assessment Lower Extremity Assessment: Defer to PT evaluation;Overall [facility] for tasks assessed   Cervical / Trunk Assessment Cervical / Trunk Assessment: Normal   Communication Communication Communication: No difficulties   Cognition Arousal/Alertness: Awake/alert Behavior During Therapy: WFL for tasks assessed/performed Overall Cognitive Status: Impaired/Different from baseline Area of Impairment: Safety/judgement                         Safety/Judgement: Decreased awareness of safety;Decreased awareness of deficits     General Comments: Pt with much improved cognition, with no agitation. Pt A/O x4. Pt lacking insight into assistance for sit to stand; otherwise, walks well. Pt difficulty multi-tasking with ambulation task and memory, sequencing counting down by 5s from 100. Pt stating "I'm ready to go home." Pt needing assist at home and spouse is "not much help." "my daughters can help, but they work."   General Comments  VSS on RA.    Exercises     Shoulder Instructions      Home Living Family/patient expects to be discharged to:: Private residence Living Arrangements: Spouse/significant other Available Help at Discharge: Family;Available 24 hours/day Type of Home: House Home Access: Stairs to enter [employer] of Steps: 8 Entrance Stairs-Rails: Can reach both Home Layout: One level     Bathroom Shower/Tub: Teacher, early years/pre: Standard     Home Equipment: Environmental consultant - 2 wheels          Prior Functioning/Environment Level of Independence: Needs assistance  Gait / Transfers Assistance Needed: ambulatory with RW ADL's / Homemaking Assistance Needed: Independent            OT Problem List: Decreased activity tolerance;Decreased strength;Decreased cognition;Decreased safety  awareness;Pain      OT Treatment/Interventions: Self-care/ADL training;Therapeutic exercise;DME and/or AE instruction;Therapeutic activities;Cognitive remediation/compensation;Patient/family education;Balance training    OT Goals(Current goals can be found in the care plan section) Acute Rehab OT Goals Patient Stated Goal: go home before Christmas OT Goal Formulation: With patient Time For Goal Achievement: 09/21/20 Potential to Achieve Goals: Good ADL Goals Pt Will Perform Grooming: (P) with supervision;standing Additional ADL Goal #2: (P) Pt will tolerate x5 mins of standing with ADL task with supervisionA. Additional ADL Goal #3: (P) Pt will perform higher level cognitive task with improved accuracy to continue to assess cognition and safety.  OT Frequency: Min 2X/week   Barriers to D/C:            Co-evaluation PT/OT/SLP Co-Evaluation/Treatment: Yes Reason for Co-Treatment: To address functional/ADL transfers;For patient/therapist safety PT goals addressed during session: Mobility/safety with mobility OT goals addressed during session: ADL's and self-care      AM-PAC OT "6 Clicks" Daily Activity     Outcome Measure Help from another person eating meals?: None Help from another person taking care of personal grooming?: A Little Help from another person toileting, which includes using toliet, bedpan, or urinal?: A Little Help from another person bathing (including washing, rinsing, drying)?: A Little Help from another person to put on and taking off regular upper body clothing?: None Help from another person to put on and taking off regular lower body clothing?: A Little 6 Click Score: 20  End of Session Equipment Utilized During Treatment: Gait belt;Rolling walker Nurse Communication: Mobility status  Activity Tolerance: Patient tolerated treatment well Patient left: in chair;with call bell/phone within reach;with chair alarm set  OT Visit Diagnosis: Unsteadiness on  feet (R26.81);Muscle weakness (generalized) (M62.81);Other symptoms and signs involving cognitive function                Time: 0750-0830 OT Time Calculation (min): 40 min Charges:  OT General Charges $OT Visit: 1 Visit OT Evaluation $OT Eval Moderate Complexity: 1 Mod OT Treatments $Self Care/Home Management : 8-22 mins  Jefferey Pica, OTR/L Acute Rehabilitation Services Pager: [phone] Office: [phone]   Swannie Milius C 09/07/2020, 10:40 AM

## 2020-09-07 NOTE — Consult Note (Signed)
[facility] Nurse Consult Note: Reason for Consult:Cellulitis is resolving per MD note. Orthopedics indicates that no surgical intervention is needed at this time. Moisture lesions between digits.  Wound type:Moisture Pressure Injury POA: N/A Measurement:between first and second digits Wound IDC:VUDTH, moist Drainage (amount, consistency, odor) serous Periwound:macerated Dressing procedure/placement/frequency: Foot, including areas between digits to be washed and gently but thoroughly dried.  Nursing provided with guidance for topical care I.e., using strips of silver hydrofiber between toes, topped with dry gauze and secured with a few turns of Kerlix roll gauze, changed daily.  Patient to follow up with Dr. Sharol Given as needed as an outpatient post discharge.  [place] [facility] nursing team will not follow, but will remain available to this patient, the nursing and medical teams.  Please re-consult if needed. Thanks, Maudie Flakes, MSN, RN, [place], Arther Abbott  Pager# [phone]

## 2020-09-08 LAB — GLUCOSE, CAPILLARY
Glucose-Capillary: 163 mg/dL — ABNORMAL HIGH (ref 70–99)
Glucose-Capillary: 172 mg/dL — ABNORMAL HIGH (ref 70–99)
Glucose-Capillary: 236 mg/dL — ABNORMAL HIGH (ref 70–99)
Glucose-Capillary: 221 mg/dL — ABNORMAL HIGH (ref 70–99)
Glucose-Capillary: 233 mg/dL — ABNORMAL HIGH (ref 70–99)

## 2020-09-08 LAB — CBC
HCT: 24 % — ABNORMAL LOW (ref 39.0–52.0)
Hemoglobin: 7.2 g/dL — ABNORMAL LOW (ref 13.0–17.0)
MCH: 27.9 pg (ref 26.0–34.0)
MCHC: 30 g/dL (ref 30.0–36.0)
MCV: 93 fL (ref 80.0–100.0)
Platelets: 203 10*3/uL (ref 150–400)
RBC: 2.58 MIL/uL — ABNORMAL LOW (ref 4.22–5.81)
RDW: 14.7 % (ref 11.5–15.5)
WBC: 11.4 10*3/uL — ABNORMAL HIGH (ref 4.0–10.5)
nRBC: 0 % (ref 0.0–0.2)

## 2020-09-08 LAB — BASIC METABOLIC PANEL
Anion gap: 8 (ref 5–15)
BUN: 53 mg/dL — ABNORMAL HIGH (ref 8–23)
CO2: 26 mmol/L (ref 22–32)
Calcium: 8.9 mg/dL (ref 8.9–10.3)
Chloride: 99 mmol/L (ref 98–111)
Creatinine, Ser: 2.25 mg/dL — ABNORMAL HIGH (ref 0.61–1.24)
GFR, Estimated: 30 mL/min — ABNORMAL LOW (ref >60)
Glucose, Bld: 220 mg/dL — ABNORMAL HIGH (ref 70–99)
Potassium: 4.6 mmol/L (ref 3.5–5.1)
Sodium: 133 mmol/L — ABNORMAL LOW (ref 135–145)

## 2020-09-08 LAB — URIC ACID: Uric Acid, Serum: 6.2 mg/dL (ref 3.7–8.6)

## 2020-09-08 MED ORDER — NEPRO/CARBSTEADY PO LIQD: 237.0000 mL | Freq: Every day | ORAL | Status: DC

## 2020-09-08 MED ORDER — PREDNISONE 20 MG PO TABS
40.0000 mg | ORAL_TABLET | Freq: Every day | ORAL | Status: DC
  Administered 2020-09-08: 10:00:00 40 mg via ORAL
  Filled 2020-09-08: qty 2

## 2020-09-08 MED ORDER — HYDRALAZINE HCL 25 MG PO TABS
25.0000 mg | ORAL_TABLET | Freq: Three times a day (TID) | ORAL | Status: DC
  Administered 2020-09-08 – 2020-09-12 (×12): 25 mg via ORAL
  Filled 2020-09-08 (×12): qty 1

## 2020-09-08 MED ORDER — LINAGLIPTIN 5 MG PO TABS
5.0000 mg | ORAL_TABLET | Freq: Every day | ORAL | Status: DC
  Administered 2020-09-08 – 2020-09-12 (×5): 5 mg via ORAL
  Filled 2020-09-08 (×5): qty 1

## 2020-09-08 MED ORDER — GLUCERNA SHAKE PO LIQD: 237.0000 mL | ORAL | Status: DC

## 2020-09-08 MED ORDER — CARVEDILOL 6.25 MG PO TABS
6.2500 mg | ORAL_TABLET | Freq: Two times a day (BID) | ORAL | Status: DC
Start: 2020-09-08 — End: 2020-09-12
  Administered 2020-09-09 – 2020-09-12 (×7): 6.25 mg via ORAL
  Filled 2020-09-08 (×8): qty 1

## 2020-09-08 MED ORDER — RENA-VITE PO TABS
1.0000 | ORAL_TABLET | Freq: Every day | ORAL | Status: DC
  Administered 2020-09-08 – 2020-09-11 (×4): 1 via ORAL
  Filled 2020-09-08 (×4): qty 1

## 2020-09-08 MED ORDER — FERROUS SULFATE 325 (65 FE) MG PO TABS
325.0000 mg | ORAL_TABLET | Freq: Every day | ORAL | Status: DC
  Administered 2020-09-09 – 2020-09-12 (×4): 325 mg via ORAL
  Filled 2020-09-08 (×4): qty 1

## 2020-09-08 MED ORDER — PREDNISONE 10 MG PO TABS
30.0000 mg | ORAL_TABLET | Freq: Every day | ORAL | Status: DC
Start: 2020-09-09 — End: 2020-09-12
  Administered 2020-09-09 – 2020-09-12 (×4): 30 mg via ORAL
  Filled 2020-09-08 (×4): qty 1

## 2020-09-08 NOTE — Plan of Care (Signed)
°  Problem: Education: Goal: Knowledge of General Education information will improve Description: Including pain rating scale, medication(s)/side effects and non-pharmacologic comfort measures Outcome: Progressing   Problem: Health Behavior/Discharge Planning: Goal: Ability to manage health-related needs will improve Outcome: Progressing   Problem: Clinical Measurements: Goal: Ability to maintain clinical measurements within normal limits will improve Outcome: Progressing Goal: Will remain free from infection Outcome: Progressing Goal: Diagnostic test results will improve Outcome: Progressing Goal: Respiratory complications will improve Outcome: Progressing Goal: Cardiovascular complication will be avoided Outcome: Progressing   Problem: Activity: Goal: Risk for activity intolerance will decrease Outcome: Progressing   Problem: Nutrition: Goal: Adequate nutrition will be maintained Outcome: Progressing   Problem: Coping: Goal: Level of anxiety will decrease Outcome: Progressing   Problem: Elimination: Goal: Will not experience complications related to bowel motility Outcome: Progressing Goal: Will not experience complications related to urinary retention Outcome: Progressing   Problem: Pain Managment: Goal: General experience of comfort will improve Outcome: Progressing   Problem: Safety: Goal: Ability to remain free from injury will improve Outcome: Progressing   Problem: Skin Integrity: Goal: Risk for impaired skin integrity will decrease Outcome: Progressing   Problem: Clinical Measurements: Goal: Ability to avoid or minimize complications of infection will improve Outcome: Progressing   Problem: Skin Integrity: Goal: Skin integrity will improve Outcome: Progressing   Problem: Safety: Goal: Non-violent Restraint(s) Outcome: Progressing

## 2020-09-08 NOTE — Progress Notes (Signed)
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  Ongoing  GOAL:   Patient will meet greater than or equal to 90% of their needs  Progressing   MONITOR:   PO intake,Supplement acceptance,Labs,Weight trends  REASON FOR ASSESSMENT:   Consult Diet education  ASSESSMENT:   74 y.o. male with medical history of renal transplant, stage 3 CKD, CHF, HTN, type 2 DM--controlled, DM neuropathy, and chronic L foot wound. He presented to the ED with confusion, L foot pain, and nausea.  12/14- CRRT initiated 12/17- last CRRT  Reviewed I/O's: +520 ml x 24 hours and -2.7 L since admission  UOP: 200 ml x 24 hours  Per MD notes, cellulitis resolving.   MD requesting diet education for gout.   Spoke with pt at bedside, who was alert but very tangential at time of visit. Unable to obtain history from pt. He reports he is eating well; noted pt consumed about 75% of breakfast. Noted documented meal completions 100%.   Pt is accepting Prosource Plus supplements. Pt consumed about 25% of Juven supplement this morning.   Due to pt's AMS, he remains in appropriate for education at this time. RD will provide handout in AVS and will follow-up with education needs as needed/ when mental status improves.   Medications reviewed and include thiamine.   Labs reviewed: Na: 133,  CBGS: 163-430 (inpatient orders for glycemic control are 0-5 units insulin aspart daily at bedtime, 0-9 units insulin aspart TID with meal,s, 2 units insulin aspart TID with meals, and 18 units insulin detemir daily).   Diet Order:   Diet Order            Diet heart healthy/carb modified Room service appropriate? 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  Estimated Nutritional Needs:   Kcal:  2100-2300  Protein:  120-135 grams  Fluid:  2 L    Loistine Chance, RD, LDN, [place] Registered Dietitian II Certified Diabetes Care and Education Specialist Please refer to [facility] for RD and/or RD on-call/weekend/after hours pager

## 2020-09-08 NOTE — Discharge Instructions (Signed)
Low Purine/ Purine Restricted Nutrition Therapy    This diet will help reduce the amount of uric acid in your blood.  You will need to limit foods with purine (a kind of uric acid).  You should drink little or no alcohol.   Foods Recommended The chart shows foods that are low to moderate in purines.  You can eat any amount of the foods that are low in purine.  For the foods that are moderate in purines, stick to the amounts shown in the chart. Food Group Foods Low in Purines Foods Moderate in Purines  Beverages Water, tea, coffee, cocoa   Breads and Cereals Bread, pasta, rice, cake, cornbread, popcorn Oatmeal (do not eat more than 2/3 cup uncooked, daily) Wheat bran, wheat germ (do not eat more than 1/4 cup dry, daily)  Condiments Salt, herbs, olives, pickles, relishes, vinegar   Dairy All dairy foods (low-fat or fat-free types are best)   Fats and Oils All types, except gravies and sauces made with meat   Fruits All   Proteins Eggs, nuts, peanut butter Meat and Poultry Crab, lobster, oysters and shrimp (limit to 1-2 servings* daily) Dried beans, peas, and lentils (limit to 1 cup cooked daily)  Soups Soups made without meat Meat- or fish-based soups, broths, or bouillons  Vegetables All vegetables, except those that are moderate in purines Asparagus, cauliflower, spinach, mushrooms, green peas (do not eat more than 1/2 cup of these vegetables daily)  Other Foods Sugars, sweets, gelatin   *1 serving = 2-3 ounces.  Foods Not Recommended No foods must be completely avoided. However, you should limit foods that are high in purines. Food Group Foods High in Purines  Beverages Beer and other alcoholic beverages  Fats and Oils Gravies and sauces made with meat  Proteins Anchovies, sardines, herring, mussels, tuna, codfish, scallops, trout, and haddock; bacon; organ meats (such as liver or kidney); tripe; sweetbreads; wild game; goose  Other Yeast and yeast extracts (taken as  supplements)    Low-Purine/Purine-Restricted Sample 1-Day Menu  Breakfast 1 cup cereal 1 cup 1% or fat-free milk 1 slice whole wheat toast 1 teaspoon butter or margarine and 1 teaspoon jam (for toast)  1/2 cup orange juice 1 cup coffee  Lunch Sandwich made with 2 slice whole wheat bread 2 tablespoons peanut butter 1/2 cup banana slices  1/2 cup fruit crisp 1 cup 1% or fat-free milk  Dinner 1 cup cheese lasagna Salad made with 1 cup lettuce and 1/2 cup tomatoes  1 slice [place] bread 1/2 cup fruit gelatin 1 cup water   Low-Purine/Purine-Restricted Vegan Sample 1-Day Menu  Breakfast 1 cup grits   cup grapefruit  1 slice whole wheat toast  1 teaspoon margarine, soft, tub  1 cup soymilk fortified with calcium, vitamin B12, and vitamin D 1 cup coffee  Lunch 2 slices whole wheat bread 2 tablespoons peanut butter  1 banana  cup hummus  cup carrot strips  cup celery strips 1 cup soymilk fortified with calcium, vitamin B12, and vitamin D  Evening Meal 1 black bean veggie burger  1 medium sweet potato, baked 1 cup steamed broccoli  2 teaspoons olive oil   cup applesauce 1 small roll 6 ounces vanilla soy yogurt   Low-Purine/Purine-Restricted Vegetarian (Lacto-Ovo) Sample 1-Day Menu  Breakfast 1 cup grits   grapefruit  1 hard-boiled egg 1 cup 1% milk 1 cup coffee  Lunch 2 slices whole wheat bread 2 tablespoons peanut butter  1 banana  cup carrot strips  cup celery strips 1 cup 1% milk  Evening Meal 1 cup whole wheat spaghetti   cup kidney beans  cup marinara sauce with mushrooms and green peppers 1 cup steamed broccoli 1 teaspoons olive oil   cup grapes 6 ounces low-fat vanilla yogurt   Copyright 2021  Academy of [employer] Dietetics. All rights reserved.

## 2020-09-08 NOTE — Progress Notes (Signed)
Physical Therapy Treatment Patient Details Name: [name] MRN: [id number] DOB: 1946/08/16 Today's Date: 09/08/2020    History of Present Illness 74 year old s/p confusion, CThead negative, l 5th MTP soft tissue swelling found and sepsis in L foot, met encepelopathy. PMHx: for renal transplant x2 (1983, 2005), CKD stage IIIb (creatinine baseline 1.44-3.1), chronic systolic and diastolic heart failure ejection fraction 40 to 45%, hypertension, DMT2, with complication of diabetic nephropathy, chronic left foot wound.    PT Comments    Patient progressing slowly towards PT goals. Requires less assist for transfers and gait training today. Continues to report discomfort in left foot but reports this has improved as well as the swelling. Pt with improvements in cognition- however still demonstrates deficits in safety and awareness of deficits. Pt tangential and needs to be redirected to task at times. Encouraged increasing mobility while in the hospital to prepare for d/c home. Will follow.    Follow Up Recommendations  Home health PT;Supervision/Assistance - 24 hour     Equipment Recommendations  3in1 (PT)    Recommendations for Other Services       Precautions / Restrictions Precautions Precautions: Fall;Other (comment) Precaution Comments: watch BP Restrictions Weight Bearing Restrictions: No    Mobility  Bed Mobility Overal bed mobility: Needs Assistance Bed Mobility: Supine to Sit;Sit to Supine     Supine to sit: Min guard;HOB elevated Sit to supine: Min guard;HOB elevated   General bed mobility comments: Use of rail, no assist needed.  Transfers Overall transfer level: Needs assistance Equipment used: Rolling walker (2 wheeled) Transfers: Sit to/from Stand Sit to Stand: Min guard;Min assist         General transfer comment: MinA to rise from edge of bed and initially steady. Cues for hand positioning.  Ambulation/Gait Ambulation/Gait assistance: Min  guard Gait Distance (Feet): 80 Feet Assistive device: Rolling walker (2 wheeled) Gait Pattern/deviations: Step-through pattern;Decreased stride length;Trunk flexed;Decreased stance time - left Gait velocity: decreased   General Gait Details: Slow, mildly unsteady gait with decreased stance time LLE, cues for upright posture and RW proximity. A few standing rest breaks. 2/4 DOE. VSS.   Stairs             Wheelchair Mobility    Modified Rankin (Stroke Patients Only)       Balance Overall balance assessment: Needs assistance Sitting-balance support: Feet supported;No upper extremity supported Sitting balance-Leahy Scale: Good     Standing balance support: During functional activity Standing balance-Leahy Scale: Poor Standing balance comment: relies on UE support.                            Cognition Arousal/Alertness: Awake/alert Behavior During Therapy: WFL for tasks assessed/performed Overall Cognitive Status: Impaired/Different from baseline Area of Impairment: Safety/judgement                         Safety/Judgement: Decreased awareness of safety;Decreased awareness of deficits     General Comments: Pt tangential with speech, needs redirection to get back on task. "I am ready to go home."  Cognition seems slightly improved. Follows commands well. Poor awarenes of safety/deficits.      Exercises      General Comments General comments (skin integrity, edema, etc.): BP pre activity 144/101, post activity BP 189/101, asymptomatic.      Pertinent Vitals/Pain Pain Assessment: Faces Faces Pain Scale: Hurts a little bit Pain Location: left foot Pain Descriptors / Indicators: Sore;Guarding  Pain Intervention(s): Monitored during session    Home Living                      Prior Function            PT Goals (current goals can now be found in the care plan section) Progress towards PT goals: Progressing toward goals     Frequency    Min 3X/week      PT Plan Current plan remains appropriate    Co-evaluation              AM-PAC PT "6 Clicks" Mobility   Outcome Measure  Help needed turning from your back to your side while in a flat bed without using bedrails?: None Help needed moving from lying on your back to sitting on the side of a flat bed without using bedrails?: None Help needed moving to and from a bed to a chair (including a wheelchair)?: A Little Help needed standing up from a chair using your arms (e.g., wheelchair or bedside chair)?: A Little Help needed to walk in hospital room?: A Little Help needed climbing 3-5 steps with a railing? : A Lot 6 Click Score: 19    End of Session Equipment Utilized During Treatment: Gait belt Activity Tolerance: Patient tolerated treatment well Patient left: in bed;with call bell/phone within reach;with bed alarm set Nurse Communication: Mobility status PT Visit Diagnosis: Muscle weakness (generalized) (M62.81);Difficulty in walking, not elsewhere classified (R26.2);Pain Pain - Right/Left: Left Pain - part of body: Ankle and joints of foot     Time: 3779-3968 PT Time Calculation (min) (ACUTE ONLY): 23 min  Charges:  $Gait Training: 8-22 mins $Therapeutic Activity: 8-22 mins                     Marisa Severin, PT, DPT Acute Rehabilitation Services Pager [phone] Office [place] 09/08/2020, 12:29 PM

## 2020-09-08 NOTE — Progress Notes (Addendum)
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  Patient was found to be febrile on arrival to the ED.  Noncontrast CT head negative for acute intracranial abnormalities.  Chest x-ray showed cardiomegaly with mild diffuse interstitial edema.  X-ray of the left foot show soft tissue swelling around the left fifth MTP without acute bony abnormality.  Lactic acid 1.9 with leukocytosis and thrombocytopenia.  Patient was admitted for sepsis secondary to left foot cellulitis.  Orthopedic think patient's left foot pain is secondary to gout.  No osteomyelitis.  Patient was hypertensive on arrival and became hypotensive after getting nitroglycerin drip with a systolic blood pressure running in the 50s.  Blood pressure now stable.  Patient serum creatinine worsened to 3.4 on 12/14.  He was a started on CRRT due to uremic symptoms.  Lab improving but patient continued to be confused.  Nephrology recommend transfer to [facility] for in case he required  intermittent hemodialysis.     Assessment & Plan:   Principal Problem:   Sepsis due to cellulitis [facility]) Active Problems:   Kidney transplant status, cadaveric   Essential hypertension   Acute encephalopathy   Anemia due to chronic kidney disease   Diabetes mellitus type 2, insulin dependent (HCC)   Chronic kidney disease, stage 3b (HCC)   Hypertensive emergency   Anemia, unspecified   Chronic renal failure, stage 3b ([place])   Renal transplant, status post   Diabetes mellitus type 2, controlled, with complications  ([place])   Diabetic nephropathy ([place])   Pressure injury of skin   Malnutrition of moderate degree   Idiopathic chronic gout of multiple sites without tophus   Systolic and diastolic CHF, acute ([place])   1-Sepsis secondary to left foot cellulitis: -Patient presented with fever, tachypnea respiration more than 20 source of infection cellulitis of bilateral lower extremity. -Hypotension was related to medications, no sepsis.  -Patient received IV antibiotics, 5 days of ceftriaxone -MRI of the left foot: Prominent erosion first M TP, base of the distal phalanx fourth toe, but no clear evidence of osteomyelitis.  No drainable fluid collection.  Circumferential edema in the forefoot, cellulitis not excluded. -Lactic acid 1.9, procalcitonin 2.1 -Dr. Dwyane Dee discussed with Dr. Sharol Given from orthopedic, patient lower extremity is secondary to acute gout and no surgical intervention is warranted. -Sepsis physiology resolved. -Cellulitis improved, mild  drainage form second toe, Added  keflex. Wound care consulted  -Continue with wound care.  -Monitor WBC.   2-Metabolic encephalopathy; related to sepsis, metabolic.  -CT head negative. -RPR, TSH, vitamin B12 and ammonia normal. -Holding sedative -Patient received CRRT, labs improving but he continued to be confused. -Patient continues to be on soft restraint and belly belt due to high risk for fall and aggression.  -Continue with Haldol as needed -Per nephrology, encephalopathy not  related to uremia. -Continue with Thiamine and folic acid.  -Patient MS is improved. He is alert and conversant. He was able to ambulate today with PT>   3-AKI on CKD stage IIIb, renal transplant x2 (1983, 2005) Related to sepsis/hemodynamic changes.  Nephrology following. Cr baseline 1.2--2 No plan for HD for now.  Received CRRT  during this admission.  Continue with Cellcept, prednisone, prograf.  Urine out put improved.  Cr stable at 2.2.  Anemia of chronic kidney  disease: Hemoglobin baseline 9. Monitor, hb stable.   -Chest pain: Resolved. Hypertensive urgency: Improved On hydralazine, holder parameter to prevent hypotension.  Decrease hydralazine to 25 mg, decrease carvedilol to home dose, to avoid bradycardia.   Acute systolic and diastolic heart failure ejection fraction 40 to 45%. 12/13; Cardiology recommend follow up with Dr Tamala Julian outpatient.   Hypotension: Related to medication resolved  Pleural effusion: Moderate pleural effusion seen on echocardiogram.  Diabetes type 2  with hypoglycemia Hyperglycemia. Discontinue D 10. Resume levemir.  Added meals coverage.  On SSI.  Resume tradjenta.  Continue with levemir.   Gout: Uric acid at 10. Continue with prednisone, start taper.  Repeat uric acid. Down to 6  Hypomagnesemia: Improved Hyperkalemia: Resolved Bilateral arm pain chronic: Dopplers negative. Moderate malnutrition; continue with supplement  Pressure Injury 08/27/20 Sacrum Posterior;Mid Stage 1 -  Intact skin with non-blanchable redness of a localized area usually over a bony prominence. 2" X 1" small stage 1 sacral PI (Active)  08/27/20 1230  Location: Sacrum  Location Orientation: Posterior;Mid  Staging: Stage 1 -  Intact skin with non-blanchable redness of a localized area usually over a bony prominence.  Wound Description (Comments): 2" X 1" small stage 1 sacral PI  Present on Admission: Yes     Nutrition Problem: Moderate Malnutrition Etiology: chronic illness (DM with neuropathy)    Signs/Symptoms: mild fat depletion,mild muscle depletion,moderate muscle depletion    Interventions: Juven,Prostat,MVI  Estimated body mass index is 27.15 kg/m as calculated from the following:   Height as of this encounter: 5\' 8"  (1.727 m).   Weight as of this encounter: 81 kg.   DVT prophylaxis: Heparin Code Status: Full code Family Communication: Discussed with patient, daughter updated 12/19 Disposition Plan:   Status is: Inpatient  Remains inpatient appropriate because:Unsafe d/c plan, still with confusion. Encephalopathy    Dispo: The patient is from: Home              Anticipated d/c is to: To be determined PT ordered              Anticipated d/c date is: 2 days              Patient currently is not medically stable to d/c.        Consultants:   Nephrology  Neurology  Procedures:   CRRT  Antimicrobials:  None  Subjective: His confusion has improved, he is alert and conversant. He urinated all over the bed.  Denies pain.    Objective: Vitals:   09/07/20 1725 09/07/20 1932 09/07/20 2300 09/08/20 0500  BP: (!) 165/77 130/64 131/74   Pulse:  64 (!) 56   Resp:  17 17   Temp:  98 F (36.7 C) 98 F (36.7 C)   TempSrc:  Oral Oral   SpO2:  96% 95%   Weight:    81 kg  Height:        Intake/Output Summary (Last 24 hours) at 09/08/2020 0816 Last data filed at 09/07/2020 1800 Gross per 24 hour  Intake 480 ml  Output 200 ml  Net 280 ml   Filed Weights   09/06/20 0442 09/07/20 0300 09/08/20 0500  Weight: 79.6 kg 80 kg 81 kg    Examination:  General exam: NAD Respiratory system: CTA Cardiovascular system: S 1, S 2 RRR Gastrointestinal system: BS present, soft,  nt Central nervous system: alert, non focal, following. command Extremities: no edema   Data Reviewed: I have personally reviewed following labs and imaging studies  CBC: Recent Labs  Lab 09/02/20 0220 09/03/20 0215 09/04/20 0612 09/05/20 0835 09/05/20 1410 09/06/20 0355 09/07/20 0810  WBC 6.9 7.4 7.5 10.0 9.2 10.3 10.0  NEUTROABS 6.3 6.3 6.5 7.2  --   --   --   HGB 8.2* 8.6* 8.7* 8.5* 8.1* 7.7* 7.7*  HCT 28.0* 28.8* 29.3* 28.1* 26.1* 25.8* 25.9*  MCV 94.0 94.1 94.5 92.7 90.6 92.1 91.2  PLT 107* 194 166 216 211 188 242   Basic Metabolic Panel: Recent Labs  Lab 09/02/20 0220 09/02/20 2113 09/03/20 0215 09/03/20 1638 09/04/20 0612 09/04/20 1618 09/06/20 0355 09/07/20 0810  09/08/20 0447  NA 133*   < > 139  141 137 137  137 136 135 130* 133*  K 5.2*   < > 4.5  4.5 4.3 5.0  5.0 4.8 4.3 4.8 4.6  CL 105   < > 109  110 104 104  104 101 102 97* 99  CO2 16*   < > 19*  19* 24 22  22 25 27  20* 26  GLUCOSE 253*   < > 37*  38* 80 304*  301* 186* 84 383* 220*  BUN 87*   < > 96*  92* 58* 36*  38* 35* 36* 43* 53*  CREATININE 3.40*   < > 2.29*  2.36* 1.43* 1.34*  1.34* 1.32* 1.84* 2.14* 2.25*  CALCIUM 8.5*   < > 8.8*  8.8* 8.7* 8.4*  8.4* 8.6* 8.6* 8.5* 8.9  MG 2.5*  --  2.5*  --  2.4  --  2.6*  --   --   PHOS 3.6   < > 2.8  2.8 2.1* 2.8  2.8 1.8* 3.2  --   --    < > = values in this interval not displayed.   GFR: Estimated Creatinine Clearance: 28.3 mL/min (A) (by C-G formula based on SCr of 2.25 mg/dL (H)). Liver Function Tests: Recent Labs  Lab 09/02/20 2113 09/03/20 0215 09/03/20 1638 09/04/20 0612 09/04/20 1618  ALBUMIN 3.1* 3.0* 3.0* 2.9* 3.2*   No results for input(s): LIPASE, AMYLASE in the last 168 hours. Recent Labs  Lab 09/06/20 0355  AMMONIA 14   Coagulation Profile: No results for input(s): INR, PROTIME in the last 168 hours. Cardiac Enzymes: No results for input(s): CKTOTAL, CKMB, CKMBINDEX, TROPONINI in the last 168 hours. BNP (last 3 results) No results for input(s): PROBNP in the last 8760 hours. HbA1C: No results for input(s): HGBA1C in the last 72 hours. CBG: Recent Labs  Lab 09/07/20 0639 09/07/20 1149 09/07/20 1646 09/07/20 2111 09/08/20 0636  GLUCAP 358* 316* 379* 430* 163*   Lipid Profile: No results for input(s): CHOL, HDL, LDLCALC, TRIG, CHOLHDL, LDLDIRECT in the last 72 hours. Thyroid Function Tests: No results for input(s): TSH, T4TOTAL, FREET4, T3FREE, THYROIDAB in the last 72 hours. Anemia Panel: No results for input(s): VITAMINB12, FOLATE, FERRITIN, TIBC, IRON, RETICCTPCT in the last 72 hours. Sepsis Labs: No results for input(s): PROCALCITON, LATICACIDVEN in the last 168 hours.  No results  found for this or any previous visit (from the past 240 hour(s)).       Radiology Studies: No results found.      Scheduled Meds: . (feeding supplement) PROSource Plus  30 mL Oral BID BM  . allopurinol  100 mg Oral Daily  . aspirin EC  162 mg Oral  BID  . atorvastatin  10 mg Oral Daily  . calcitRIOL  0.25 mcg Oral Daily  . carvedilol  12.5 mg Oral BID WC  . cephALEXin  500 mg Oral Q12H  . Chlorhexidine Gluconate Cloth  6 each Topical Daily  . feeding supplement (GLUCERNA SHAKE)  237 mL Oral Q24H  . heparin  5,000 Units Subcutaneous Q8H  . hydrALAZINE  50 mg Oral TID  . insulin aspart  0-5 Units Subcutaneous QHS  . insulin aspart  0-9 Units Subcutaneous TID WC  . insulin aspart  2 Units Subcutaneous TID WC  . insulin detemir  18 Units Subcutaneous Daily  . latanoprost  1 drop Both Eyes QHS  . linagliptin  5 mg Oral Daily  . mouth rinse  15 mL Mouth Rinse BID  . multivitamin with minerals  1 tablet Oral Daily  . mycophenolate  500 mg Oral BID  . nutrition supplement (JUVEN)  1 packet Oral BID BM  . predniSONE  50 mg Oral Daily  . sodium bicarbonate  650 mg Oral TID  . sodium chloride flush  3 mL Intravenous Q12H  . sodium chloride flush  3 mL Intravenous Q12H  . tacrolimus  1 mg Oral BID  . thiamine injection  100 mg Intravenous Daily   Continuous Infusions: . sodium chloride       LOS: 13 days    Time spent: 35 minutes.     Elmarie Shiley, MD [facility]   If 7PM-7AM, please contact night-coverage [email or web address]  09/08/2020, 8:16 AM

## 2020-09-08 NOTE — Progress Notes (Signed)
Patient ID: [name], male   DOB: 1946-03-10, 74 y.o.   MRN: [id number] S: Feels much better today. O:BP 131/62 (BP Location: Left Arm)   Pulse (!) 56   Temp 98.1 F (36.7 C) (Oral)   Resp 16   Ht 5\' 8"  (1.727 m)   Wt 81 kg   SpO2 95%   BMI 27.15 kg/m   Intake/Output Summary (Last 24 hours) at 09/08/2020 1257 Last data filed at 09/07/2020 1800 Gross per 24 hour  Intake 240 ml  Output 200 ml  Net 40 ml   Intake/Output: I/O last 3 completed shifts: In: 720 [P.O.:720] Out: 2300 [Urine:2300]  Intake/Output this shift:  No intake/output data recorded. Weight change: 1 kg Gen: NAD CVS: bradycardia at 56, no rub Resp: cta Abd: +BS, soft, NT/ND Ext: no edema, left foot wound under dressing  Recent Labs  Lab 09/02/20 0220 09/02/20 2113 09/03/20 0215 09/03/20 1638 09/04/20 0612 09/04/20 1618 09/06/20 0355 09/07/20 0810 09/08/20 0447  NA 133* 137 139  141 137 137  137 136 135 130* 133*  K 5.2* 4.8 4.5  4.5 4.3 5.0  5.0 4.8 4.3 4.8 4.6  CL 105 108 109  110 104 104  104 101 102 97* 99  CO2 16* 17* 19*  19* 24 22  22 25 27  20* 26  GLUCOSE 253* 74 37*  38* 80 304*  301* 186* 84 383* 220*  BUN 87* 113* 96*  92* 58* 36*  38* 35* 36* 43* 53*  CREATININE 3.40* 2.90* 2.29*  2.36* 1.43* 1.34*  1.34* 1.32* 1.84* 2.14* 2.25*  ALBUMIN  --  3.1* 3.0* 3.0* 2.9* 3.2*  --   --   --   CALCIUM 8.5* 9.0 8.8*  8.8* 8.7* 8.4*  8.4* 8.6* 8.6* 8.5* 8.9  PHOS 3.6 3.6 2.8  2.8 2.1* 2.8  2.8 1.8* 3.2  --   --    Liver Function Tests: Recent Labs  Lab 09/03/20 1638 09/04/20 0612 09/04/20 1618  ALBUMIN 3.0* 2.9* 3.2*   No results for input(s): LIPASE, AMYLASE in the last 168 hours. Recent Labs  Lab 09/06/20 0355  AMMONIA 14   CBC: Recent Labs  Lab 09/03/20 0215 09/04/20 0612 09/05/20 0835 09/05/20 1410 09/06/20 0355 09/07/20 0810 09/08/20 0447  WBC 7.4 7.5 10.0 9.2 10.3 10.0 11.4*  NEUTROABS 6.3 6.5 7.2  --   --   --   --   HGB 8.6* 8.7* 8.5* 8.1* 7.7*  7.7* 7.2*  HCT 28.8* 29.3* 28.1* 26.1* 25.8* 25.9* 24.0*  MCV 94.1 94.5 92.7 90.6 92.1 91.2 93.0  PLT 194 166 216 211 188 196 203   Cardiac Enzymes: No results for input(s): CKTOTAL, CKMB, CKMBINDEX, TROPONINI in the last 168 hours. CBG: Recent Labs  Lab 09/07/20 1646 09/07/20 2111 09/08/20 0636 09/08/20 1108 09/08/20 1248  GLUCAP 379* 430* 163* 172* 236*    Iron Studies: No results for input(s): IRON, TIBC, TRANSFERRIN, FERRITIN in the last 72 hours. Studies/Results: No results found. . (feeding supplement) PROSource Plus  30 mL Oral BID BM  . allopurinol  100 mg Oral Daily  . aspirin EC  162 mg Oral BID  . atorvastatin  10 mg Oral Daily  . calcitRIOL  0.25 mcg Oral Daily  . carvedilol  6.25 mg Oral BID WC  . cephALEXin  500 mg Oral Q12H  . Chlorhexidine Gluconate Cloth  6 each Topical Daily  . feeding supplement (NEPRO CARB STEADY)  237 mL Oral QHS  . [START ON  09/09/2020] ferrous sulfate  325 mg Oral Q breakfast  . heparin  5,000 Units Subcutaneous Q8H  . hydrALAZINE  25 mg Oral TID  . insulin aspart  0-5 Units Subcutaneous QHS  . insulin aspart  0-9 Units Subcutaneous TID WC  . insulin aspart  2 Units Subcutaneous TID WC  . insulin detemir  18 Units Subcutaneous Daily  . latanoprost  1 drop Both Eyes QHS  . linagliptin  5 mg Oral Daily  . mouth rinse  15 mL Mouth Rinse BID  . multivitamin  1 tablet Oral QHS  . mycophenolate  500 mg Oral BID  . [START ON 09/09/2020] predniSONE  30 mg Oral Daily  . sodium bicarbonate  650 mg Oral TID  . sodium chloride flush  3 mL Intravenous Q12H  . sodium chloride flush  3 mL Intravenous Q12H  . tacrolimus  1 mg Oral BID  . thiamine injection  100 mg Intravenous Daily    BMET    Component Value Date/Time   NA 133 (L) 09/08/2020 0447   NA 143 03/01/2019 0833   K 4.6 09/08/2020 0447   CL 99 09/08/2020 0447   CO2 26 09/08/2020 0447   GLUCOSE 220 (H) 09/08/2020 0447   BUN 53 (H) 09/08/2020 0447   BUN 51 (H) 03/01/2019 0833    CREATININE 2.25 (H) 09/08/2020 0447   CALCIUM 8.9 09/08/2020 0447   CALCIUM 10.0 02/27/2019 1235   GFRNONAA 30 (L) 09/08/2020 0447   GFRAA 47 (L) 06/06/2020 0400   CBC    Component Value Date/Time   WBC 11.4 (H) 09/08/2020 0447   RBC 2.58 (L) 09/08/2020 0447   HGB 7.2 (L) 09/08/2020 0447   HCT 24.0 (L) 09/08/2020 0447   PLT 203 09/08/2020 0447   MCV 93.0 09/08/2020 0447   MCH 27.9 09/08/2020 0447   MCHC 30.0 09/08/2020 0447   RDW 14.7 09/08/2020 0447   LYMPHSABS 1.1 09/05/2020 0835   MONOABS 1.2 (H) 09/05/2020 0835   EOSABS 0.1 09/05/2020 0835   BASOSABS 0.0 09/05/2020 0835    Summary: Pt is a 74 y.o. yo male  with history of hypertension, HLD, type II DM, CHF, A. fib, ESRD status post kidney transplant in 1983, 2005, CKD 4 with baseline creatinine level around 2..0-2.2, admitted with left foot infection, osteomyelitis, seen as a consultation for  evaluation of AKI on CKD.  Assessment/Plan:  1. AKI/CKD stage IV s/p DDKT- AKI presumably due to ischemic ATN in setting of sepsis and hemodynamic changes related to hypertensive urgency that was present upon admission followed by profound hypotension (SBP in the 50's).  Baseline Scr 2-2.4 but rose to 3.4 and CRRT initiated on 12/15 due to AMS and +/- uremia.  CRRT stopped on 09/05/20 and transferred to [facility] for possible IHD, however has had marked increase in UOP.  Hopefully he is diuretic phase of recovery and will not require further RRT.  Will hold off removing temp HD cath for now and follow UOP and daily Scr. 1. Scr slowly rising towards his baseline of 2.2-2.4.  No indication for dialysis at this time. 2. Will continue to follow UOP and daily Scr.  3. Keep right fem HD cath in place for now until Scr reaches a plateau or starts to improve.  2. AMS/delirium- presumably due to infection and AKI.  Improving. 3. ESRD s/p DDKT in 2005.  Continue with prograf, cellcept, and prednisone 4. HTN/Volume- stable 5. Sepsis due to left foot  cellulitis- resolved with IV ceftriaxone.  Per primary.  Now on oral keflex. Wound care consulted  [name], MD [facility] [phone]

## 2020-09-08 NOTE — Progress Notes (Addendum)
Inpatient Diabetes Program Recommendations  AACE/ADA: New Consensus Statement on Inpatient Glycemic Control (2015)  Target Ranges:  Prepandial:   less than 140 mg/dL      Peak postprandial:   less than 180 mg/dL (1-2 hours)      Critically ill patients:  140 - 180 mg/dL   Lab Results  Component Value Date   GLUCAP 163 (H) 09/08/2020   HGBA1C 6.6 (H) 08/27/2020    Review of Glycemic Control Results for MARTI, ACEBO (MRN [id number]) as of 09/08/2020 10:49  Ref. Range 09/07/2020 16:46 09/07/2020 21:11 09/08/2020 06:36  Glucose-Capillary Latest Ref Range: 70 - 99 mg/dL 379 (H) 430 (H) 163 (H)   Diabetes history:DM2 Outpatient Diabetes medications:Lantus qd (15 units if CBG >300 & 7 units if CBG <300) + Humalog 3-5 units tid meal coverage + Tradjenta 5 mg qd Current orders for Inpatient glycemic control:Novolog 2 units tid meal coverage tid + Novolog sensitive correction 0-9 units tid + hs 0-5 units + Tradjenta 5 units + Lantus 18 units Qd  Prednisone 40 mg qd.  Inpatient Diabetes Program Recommendations:  Increase meal coverage to Novolog to 6 units TID (assuming patient is to consume >50% of meal)  Thanks, Bronson Curb, MSN, RNC-OB Diabetes Coordinator [phone] (8a-5p)

## 2020-09-09 LAB — GLUCOSE, CAPILLARY
Glucose-Capillary: 325 mg/dL — ABNORMAL HIGH (ref 70–99)
Glucose-Capillary: 155 mg/dL — ABNORMAL HIGH (ref 70–99)
Glucose-Capillary: 133 mg/dL — ABNORMAL HIGH (ref 70–99)
Glucose-Capillary: 274 mg/dL — ABNORMAL HIGH (ref 70–99)
Glucose-Capillary: 303 mg/dL — ABNORMAL HIGH (ref 70–99)
Glucose-Capillary: 269 mg/dL — ABNORMAL HIGH (ref 70–99)

## 2020-09-09 LAB — BASIC METABOLIC PANEL
Anion gap: 9 (ref 5–15)
BUN: 61 mg/dL — ABNORMAL HIGH (ref 8–23)
CO2: 26 mmol/L (ref 22–32)
Calcium: 9.1 mg/dL (ref 8.9–10.3)
Chloride: 100 mmol/L (ref 98–111)
Creatinine, Ser: 2.22 mg/dL — ABNORMAL HIGH (ref 0.61–1.24)
GFR, Estimated: 31 mL/min — ABNORMAL LOW (ref >60)
Glucose, Bld: 172 mg/dL — ABNORMAL HIGH (ref 70–99)
Potassium: 4.4 mmol/L (ref 3.5–5.1)
Sodium: 135 mmol/L (ref 135–145)

## 2020-09-09 LAB — CBC
HCT: 24 % — ABNORMAL LOW (ref 39.0–52.0)
Hemoglobin: 7.2 g/dL — ABNORMAL LOW (ref 13.0–17.0)
MCH: 28 pg (ref 26.0–34.0)
MCHC: 30 g/dL (ref 30.0–36.0)
MCV: 93.4 fL (ref 80.0–100.0)
Platelets: 196 10*3/uL (ref 150–400)
RBC: 2.57 MIL/uL — ABNORMAL LOW (ref 4.22–5.81)
RDW: 15 % (ref 11.5–15.5)
WBC: 11 10*3/uL — ABNORMAL HIGH (ref 4.0–10.5)
nRBC: 0 % (ref 0.0–0.2)

## 2020-09-09 MED ORDER — THIAMINE HCL 100 MG PO TABS
100.0000 mg | ORAL_TABLET | Freq: Every day | ORAL | Status: DC
  Administered 2020-09-09 – 2020-09-12 (×4): 100 mg via ORAL
  Filled 2020-09-09 (×5): qty 1

## 2020-09-09 MED ORDER — DARBEPOETIN ALFA 200 MCG/0.4ML IJ SOSY
200.0000 ug | PREFILLED_SYRINGE | Freq: Once | INTRAMUSCULAR | Status: AC
  Administered 2020-09-09: 17:00:00 200 ug via SUBCUTANEOUS
  Filled 2020-09-09: qty 0.4

## 2020-09-09 MED ORDER — CEPHALEXIN 500 MG PO CAPS
500.0000 mg | ORAL_CAPSULE | Freq: Two times a day (BID) | ORAL | Status: AC
  Administered 2020-09-09 – 2020-09-11 (×4): 500 mg via ORAL
  Filled 2020-09-09 (×4): qty 1

## 2020-09-09 NOTE — Progress Notes (Signed)
Physical Therapy Treatment Patient Details Name: [name] MRN: [id number] DOB: 01/03/1946 Today's Date: 09/09/2020    History of Present Illness 74-year-old s/p confusion, CThead negative, l 5th MTP soft tissue swelling found and sepsis in L foot, met encepelopathy. PMHx: for renal transplant x2 (1983, 2005), CKD stage IIIb (creatinine baseline 2.08-2.2), chronic systolic and diastolic heart failure ejection fraction 40 to 45%, hypertension, DMT2, with complication of diabetic nephropathy, chronic left foot wound.    PT Comments    On entry pt states "I haven't felt this good for a long time." Pt making good progress towards his goals today. Pt is currently min guard for transfers and ambulation with RW. Pt educated on need for energy conservation as well as small bouts of exercise throughout the day to improve strength and endurance. D/c plans remain appropriate at this time, as daughter will be available tomorrow to assist. PT will continue to follow acutely.   Follow Up Recommendations  Home health PT;Supervision/Assistance - 24 hour     Equipment Recommendations  3in1 (PT)       Precautions / Restrictions Precautions Precautions: Fall;Other (comment) Precaution Comments: watch BP Restrictions Weight Bearing Restrictions: No    Mobility  Bed Mobility               General bed mobility comments: pt sitting in recliner upon arrival  Transfers Overall transfer level: Needs assistance Equipment used: Rolling walker (2 wheeled) Transfers: Sit to/from Stand Sit to Stand: Min guard         General transfer comment: minguard for safety  Ambulation/Gait Ambulation/Gait assistance: Min guard Gait Distance (Feet): 120 Feet Assistive device: Rolling walker (2 wheeled) Gait Pattern/deviations: Step-through pattern;Decreased stride length;Trunk flexed;Decreased stance time - left Gait velocity: decreased   General Gait Details: min guard for safety with slow,  mildly unsteady gait, no overt LoB         Balance Overall balance assessment: Needs assistance Sitting-balance support: Feet supported;No upper extremity supported Sitting balance-Leahy Scale: Good     Standing balance support: During functional activity;Single extremity supported Standing balance-Leahy Scale: Poor Standing balance comment: requires at least single UE support                            Cognition Arousal/Alertness: Awake/alert Behavior During Therapy: WFL for tasks assessed/performed Overall Cognitive Status: No family/caregiver present to determine baseline cognitive functioning Area of Impairment: Safety/judgement                         Safety/Judgement: Decreased awareness of safety;Decreased awareness of deficits     General Comments: pt requires increased cuing for safety         General Comments General comments (skin integrity, edema, etc.): VSS on RA      Pertinent Vitals/Pain Pain Assessment: No/denies pain Pain Intervention(s): Monitored during session           PT Goals (current goals can now be found in the care plan section) Acute Rehab PT Goals Patient Stated Goal: go home before Christmas PT Goal Formulation: With patient Time For Goal Achievement: 09/21/20 Potential to Achieve Goals: Good Progress towards PT goals: Progressing toward goals    Frequency    Min 3X/week      PT Plan Current plan remains appropriate       AM-PAC PT "6 Clicks" Mobility   Outcome Measure  Help needed turning from your back to your side   while in a flat bed without using bedrails?: None Help needed moving from lying on your back to sitting on the side of a flat bed without using bedrails?: None Help needed moving to and from a bed to a chair (including a wheelchair)?: A Little Help needed standing up from a chair using your arms (e.g., wheelchair or bedside chair)?: A Little Help needed to walk in hospital room?: A  Little Help needed climbing 3-5 steps with a railing? : A Lot 6 Click Score: 19    End of Session Equipment Utilized During Treatment: Gait belt Activity Tolerance: Patient tolerated treatment well Patient left: with call bell/phone within reach;in chair Nurse Communication: Mobility status PT Visit Diagnosis: Muscle weakness (generalized) (M62.81);Difficulty in walking, not elsewhere classified (R26.2);Pain     Time: 6387-5643 PT Time Calculation (min) (ACUTE ONLY): 17 min  Charges:  $Gait Training: 8-22 mins                     [place] Francisco B. Migdalia Dk PT, DPT Acute Rehabilitation Services Pager [phone] Office [phone]    [place] 09/09/2020, 3:10 PM

## 2020-09-09 NOTE — Progress Notes (Addendum)
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  Patient was found to be febrile on arrival to the ED.  Noncontrast CT head negative for acute intracranial abnormalities.  Chest x-ray showed cardiomegaly with mild diffuse interstitial edema.  X-ray of the left foot show soft tissue swelling around the left fifth MTP without acute bony abnormality.  Lactic acid 1.9 with leukocytosis and thrombocytopenia.  Patient was admitted for sepsis secondary to left foot cellulitis.  Orthopedic think patient's left foot pain is secondary to gout.  No osteomyelitis.  Patient was hypertensive on arrival and became hypotensive after getting nitroglycerin drip with a systolic blood pressure running in the 50s.  Blood pressure now stable.  Patient serum creatinine worsened to 3.4 on 12/14.  He was a started on CRRT due to uremic symptoms.  Lab improving but patient continued to be confused.  Nephrology recommend transfer to [facility] for in case he required  intermittent hemodialysis.   Renal function has improved, back to prior baseline. He received treatment for LE infection and gout. Plan for possible discharge 12/22 if ok by renal.   Assessment & Plan:   Principal Problem:   Sepsis due to cellulitis [facility]) Active Problems:   Kidney transplant status, cadaveric   Essential hypertension   Acute encephalopathy   Anemia due to chronic kidney disease   Diabetes mellitus type 2, insulin dependent (HCC)   Chronic kidney disease, stage 3b ([place])   Hypertensive emergency    Anemia, unspecified   Chronic renal failure, stage 3b ([place])   Renal transplant, status post   Diabetes mellitus type 2, controlled, with complications ([place])   Diabetic nephropathy ([place])   Pressure injury of skin   Malnutrition of moderate degree   Idiopathic chronic gout of multiple sites without tophus   Systolic and diastolic CHF, acute ([place])   1-Sepsis secondary to left foot cellulitis: -Patient presented with fever, tachypnea respiration more than 20 source of infection cellulitis of bilateral lower extremity. -Patient received IV antibiotics, 5 days of ceftriaxone -MRI of the left foot: Prominent erosion first M TP, base of the distal phalanx fourth toe, but no clear evidence of osteomyelitis.  No drainable fluid collection.  Circumferential edema in the forefoot, cellulitis not excluded. -Lactic acid 1.9, procalcitonin 2.1 -Dr. Dwyane Dee discussed with Dr. Sharol Given from orthopedic, patient lower extremity is secondary to acute gout and no surgical intervention is warranted. -Sepsis physiology resolved. -Cellulitis improved, mild  drainage form second toe, Added  Keflex. Plan to complete 2 more  days.  Wound care consulted  -Continue with wound care. Wound looks better.  -WBC stable.   2-Metabolic encephalopathy; related to sepsis, metabolic.  -CT head negative. -RPR, TSH, vitamin B12 and ammonia normal. -Holding sedative -Patient received CRRT, labs improving but he continued to be confused. -Patient continues to be on soft restraint and belly belt due to high risk for fall and aggression.  -Continue with Haldol as needed -Per nephrology, encephalopathy not  related to uremia. -Continue with Thiamine and folic acid.  -Patient MS is improved. He is alert and conversant. He was able to ambulate today with PT>   3-AKI  on CKD stage IIIb, renal transplant x2 (1983, 2005) Related to sepsis/hemodynamic changes.  Nephrology following. Cr baseline 1.2--2 No plan for HD for now.  Received  CRRT during this admission.  Continue with Cellcept, prednisone, prograf.  Urine out put improved.  Cr stable at 2.2. Plan to remove temporary  HD catheter today.  Will need to follow nephrologist recommendation for Diuretics for discharge.   Anemia of chronic kidney disease: Hemoglobin baseline 9. Hb down to 7. On oral iron. Will discussed with nephrology about aranesp.    -Chest pain: Resolved. Hypertensive urgency: Improved On hydralazine, holder parameter to prevent hypotension.  Decrease hydralazine to 25 mg, decrease carvedilol to home dose, to avoid bradycardia.   Acute systolic and diastolic heart failure ejection fraction 40 to 45%. 12/13; Cardiology recommend follow up with Dr Tamala Julian outpatient.   Hypotension: Related to medication resolved  Pleural effusion: Moderate pleural effusion seen on echocardiogram.  Diabetes type 2  with hypoglycemia Hyperglycemia. Discontinue D 10. Resume levemir.  Added meals coverage.  On SSI.  Resume tradjenta.  Continue with levemir.  CBG better controlled.   Gout: Uric acid at 10. Continue with prednisone, started  taper.  Repeat uric acid. Down to 6  Hypomagnesemia: Improved Hyperkalemia: Resolved Bilateral arm pain chronic: Dopplers negative. Moderate malnutrition; continue with supplement  Pressure Injury 08/27/20 Sacrum Posterior;Mid Stage 1 -  Intact skin with non-blanchable redness of a localized area usually over a bony prominence. 2" X 1" small stage 1 sacral PI (Active)  08/27/20 1230  Location: Sacrum  Location Orientation: Posterior;Mid  Staging: Stage 1 -  Intact skin with non-blanchable redness of a localized area usually over a bony prominence.  Wound Description (Comments): 2" X 1" small stage 1 sacral PI  Present on Admission: Yes     Nutrition Problem: Moderate Malnutrition Etiology: chronic illness (DM with neuropathy)    Signs/Symptoms: mild fat depletion,mild muscle depletion,moderate muscle  depletion    Interventions: Juven,Prostat,MVI  Estimated body mass index is 27.32 kg/m as calculated from the following:   Height as of this encounter: 5\' 8"  (1.727 m).   Weight as of this encounter: 81.5 kg.   DVT prophylaxis: Heparin Code Status: Full code Family Communication: Discussed with patient. Will update daughter today  Disposition Plan:  Status is: Inpatient  Remains inpatient appropriate because:Unsafe d/c plan, home tomorrow.    Dispo: The patient is from: Home              Anticipated d/c is to: Home               Anticipated d/c date is: 1 day              Patient currently is not medically stable to d/c. home tomorrow 12/22        Consultants:   Nephrology  Neurology  Procedures:   CRRT  Antimicrobials:  None  Subjective: He is feeling better, denies pain.     Objective: Vitals:   09/09/20 0029 09/09/20 0441 09/09/20 0800 09/09/20 1201  BP:  (!) 145/85 (!) 154/76 139/61  Pulse:  60 (!) 51 (!) 53  Resp:  17 17 (!) 21  Temp:  98.2 F (36.8 C) 98.2 F (36.8 C) 98.7 F (37.1 C)  TempSrc:  Oral Oral Oral  SpO2:  98% 90%   Weight: 81.5 kg     Height:        Intake/Output Summary (Last 24 hours) at 09/09/2020 1309 Last data filed at 09/09/2020 [phone]  Gross per 24 hour  Intake 240 ml  Output 800 ml  Net -560 ml   Filed Weights   09/07/20 0300 09/08/20 0500 09/09/20 0029  Weight: 80 kg 81 kg 81.5 kg    Examination:  General exam: NAD Respiratory system: CTA Cardiovascular system: S 1, S 2  RRR Gastrointestinal system: BS present, soft, nt Central nervous system: Alert, following command Extremities: No edema   Data Reviewed: I have personally reviewed following labs and imaging studies  CBC: Recent Labs  Lab 09/03/20 0215 09/04/20 0612 09/05/20 0835 09/05/20 1410 09/06/20 0355 09/07/20 0810 09/08/20 0447 09/09/20 0447  WBC 7.4 7.5 10.0 9.2 10.3 10.0 11.4* 11.0*  NEUTROABS 6.3 6.5 7.2  --   --   --   --   --    HGB 8.6* 8.7* 8.5* 8.1* 7.7* 7.7* 7.2* 7.2*  HCT 28.8* 29.3* 28.1* 26.1* 25.8* 25.9* 24.0* 24.0*  MCV 94.1 94.5 92.7 90.6 92.1 91.2 93.0 93.4  PLT 194 166 216 211 188 196 203 751   Basic Metabolic Panel: Recent Labs  Lab 09/03/20 0215 09/03/20 1638 09/04/20 0612 09/04/20 1618 09/06/20 0355 09/07/20 0810 09/08/20 0447 09/09/20 0447  NA 139  141 137 137  137 136 135 130* 133* 135  K 4.5  4.5 4.3 5.0  5.0 4.8 4.3 4.8 4.6 4.4  CL 109  110 104 104  104 101 102 97* 99 100  CO2 19*  19* 24 22  22 25 27  20* 26 26  GLUCOSE 37*  38* 80 304*  301* 186* 84 383* 220* 172*  BUN 96*  92* 58* 36*  38* 35* 36* 43* 53* 61*  CREATININE 2.29*  2.36* 1.43* 1.34*  1.34* 1.32* 1.84* 2.14* 2.25* 2.22*  CALCIUM 8.8*  8.8* 8.7* 8.4*  8.4* 8.6* 8.6* 8.5* 8.9 9.1  MG 2.5*  --  2.4  --  2.6*  --   --   --   PHOS 2.8  2.8 2.1* 2.8  2.8 1.8* 3.2  --   --   --    GFR: Estimated Creatinine Clearance: 28.7 mL/min (A) (by C-G formula based on SCr of 2.22 mg/dL (H)). Liver Function Tests: Recent Labs  Lab 09/02/20 2113 09/03/20 0215 09/03/20 1638 09/04/20 0612 09/04/20 1618  ALBUMIN 3.1* 3.0* 3.0* 2.9* 3.2*   No results for input(s): LIPASE, AMYLASE in the last 168 hours. Recent Labs  Lab 09/06/20 0355  AMMONIA 14   Coagulation Profile: No results for input(s): INR, PROTIME in the last 168 hours. Cardiac Enzymes: No results for input(s): CKTOTAL, CKMB, CKMBINDEX, TROPONINI in the last 168 hours. BNP (last 3 results) No results for input(s): PROBNP in the last 8760 hours. HbA1C: No results for input(s): HGBA1C in the last 72 hours. CBG: Recent Labs  Lab 09/08/20 1559 09/08/20 2059 09/09/20 0631 09/09/20 0826 09/09/20 1223  GLUCAP 221* 233* 155* 133* 274*   Lipid Profile: No results for input(s): CHOL, HDL, LDLCALC, TRIG, CHOLHDL, LDLDIRECT in the last 72 hours. Thyroid Function Tests: No results for input(s): TSH, T4TOTAL, FREET4, T3FREE, THYROIDAB in the last 72  hours. Anemia Panel: No results for input(s): VITAMINB12, FOLATE, FERRITIN, TIBC, IRON, RETICCTPCT in the last 72 hours. Sepsis Labs: No results for input(s): PROCALCITON, LATICACIDVEN in the last 168 hours.  No results found for this or any previous visit (from the past 240 hour(s)).       Radiology Studies: No results found.      Scheduled Meds: . (feeding supplement) PROSource Plus  30 mL Oral BID BM  . allopurinol  100 mg Oral Daily  . aspirin EC  162 mg Oral BID  . atorvastatin  10 mg Oral Daily  . calcitRIOL  0.25 mcg Oral Daily  . carvedilol  6.25 mg Oral BID WC  . cephALEXin  500 mg Oral Q12H  . Chlorhexidine Gluconate Cloth  6 each Topical Daily  . feeding supplement (NEPRO CARB STEADY)  237 mL Oral QHS  . ferrous sulfate  325 mg Oral Q breakfast  . heparin  5,000 Units Subcutaneous Q8H  . hydrALAZINE  25 mg Oral TID  . insulin aspart  0-5 Units Subcutaneous QHS  . insulin aspart  0-9 Units Subcutaneous TID WC  . insulin aspart  2 Units Subcutaneous TID WC  . insulin detemir  18 Units Subcutaneous Daily  . latanoprost  1 drop Both Eyes QHS  . linagliptin  5 mg Oral Daily  . mouth rinse  15 mL Mouth Rinse BID  . multivitamin  1 tablet Oral QHS  . mycophenolate  500 mg Oral BID  . predniSONE  30 mg Oral Daily  . sodium bicarbonate  650 mg Oral TID  . sodium chloride flush  3 mL Intravenous Q12H  . sodium chloride flush  3 mL Intravenous Q12H  . tacrolimus  1 mg Oral BID  . thiamine  100 mg Oral Daily   Continuous Infusions: . sodium chloride       LOS: 14 days    Time spent: 35 minutes.     Elmarie Shiley, MD [facility]   If 7PM-7AM, please contact night-coverage [email or web address]  09/09/2020, 1:09 PM

## 2020-09-09 NOTE — Progress Notes (Signed)
Occupational Therapy Treatment Patient Details Name: [name] MRN: [id number] DOB: January 01, 1946 Today's Date: 09/09/2020    History of present illness 74 year old s/p confusion, CThead negative, l 5th MTP soft tissue swelling found and sepsis in L foot, met encepelopathy. PMHx: for renal transplant x2 (1983, 2005), CKD stage IIIb (creatinine baseline 0.26-3.7), chronic systolic and diastolic heart failure ejection fraction 40 to 45%, hypertension, DMT2, with complication of diabetic nephropathy, chronic left foot wound.   OT comments  Pt sitting in recliner upon arrival. Pt agreeable to OT session. Pt currently requires minguard for functional mobility at RW level. He required minguard for standing to complete grooming at sink level. Pt required cue to initiate seated rest break. HR 70s at rest, up to 130bpm with ADL. BP 139/44mHg following grooming. Pt demonstrates improvement with cognition, but still demonstrates decreased safety awareness. Continued to educate pt on energy conservation strategies for ADL/IADL and functional mobility. Pt will continue to benefit from skilled OT services to maximize safety and independence with ADL/IADL and functional mobility. Will continue to follow acutely and progress as tolerated.    Follow Up Recommendations  Home health OT;Supervision/Assistance - 24 hour (If 24/7 not available, recommend SNF)    Equipment Recommendations  3 in 1 bedside commode    Recommendations for Other Services      Precautions / Restrictions Precautions Precautions: Fall;Other (comment) Precaution Comments: watch BP Restrictions Weight Bearing Restrictions: No       Mobility Bed Mobility               General bed mobility comments: pt sitting in recliner upon arrival  Transfers Overall transfer level: Needs assistance Equipment used: Rolling walker (2 wheeled) Transfers: Sit to/from Stand Sit to Stand: Min guard         General transfer comment:  minguard for safety    Balance Overall balance assessment: Needs assistance Sitting-balance support: Feet supported;No upper extremity supported Sitting balance-Leahy Scale: Good     Standing balance support: During functional activity;Single extremity supported Standing balance-Leahy Scale: Poor Standing balance comment: demonstrated preference for at least single UE support while standing at sink level                           ADL either performed or assessed with clinical judgement   ADL Overall ADL's : Needs assistance/impaired Eating/Feeding: Set up;Sitting   Grooming: Min guard;Standing Grooming Details (indicate cue type and reason): for 5 min at sink, required vc to sit due to tachy HR Upper Body Bathing: Set up;Sitting               Toilet Transfer: Ambulation;RW;Min guard Toilet Transfer Details (indicate cue type and reason): minguard for safety Toileting- Clothing Manipulation and Hygiene: Min guard;Sit to/from stand       Functional mobility during ADLs: Min guard;Rolling walker;Cueing for safety General ADL Comments: pt limited by decreased safety awareness and decreased activity tolerance, pt educated on use of energy conservation strategies during ADL completion     Vision   Vision Assessment?: No apparent visual deficits   Perception     Praxis      Cognition Arousal/Alertness: Awake/alert Behavior During Therapy: WFL for tasks assessed/performed Overall Cognitive Status: No family/caregiver present to determine baseline cognitive functioning Area of Impairment: Safety/judgement                         Safety/Judgement: Decreased awareness of safety;Decreased awareness of  deficits     General Comments: Pt with tangential speech, requires redirection at times. demonstrated improvement with safety awareness, continues to require cues to initiate rest break.        Exercises     Shoulder Instructions       General  Comments HR 80s at rest, up to 130bpm with standing at sink level for ADL;BP 139/61 following ADL    Pertinent Vitals/ Pain       Pain Assessment: No/denies pain Pain Intervention(s): Monitored during session  Home Living                                          Prior Functioning/Environment              Frequency  Min 2X/week        Progress Toward Goals  OT Goals(current goals can now be found in the care plan section)  Progress towards OT goals: Progressing toward goals  Acute Rehab OT Goals Patient Stated Goal: go home before Christmas OT Goal Formulation: With patient Time For Goal Achievement: 09/21/20 Potential to Achieve Goals: Good ADL Goals Pt Will Perform Grooming: with supervision;standing Pt Will Perform Lower Body Dressing: with min assist;sit to/from stand;sitting/lateral leans Pt Will Transfer to Toilet: with min guard assist;ambulating;bedside commode (walker) Pt Will Perform Toileting - Clothing Manipulation and hygiene: with min assist;sit to/from stand;sitting/lateral leans Additional ADL Goal #1: Patient will require  less than 25% multimodal cues for safety awareness during functional transfers in order to reduce risk of falls Additional ADL Goal #2: Pt will tolerate x5 mins of standing with ADL task with supervisionA. Additional ADL Goal #3: Pt will perform higher level cognitive task with improved accuracy to continue to assess cognition and safety.  Plan Discharge plan remains appropriate    Co-evaluation                 AM-PAC OT "6 Clicks" Daily Activity     Outcome Measure   Help from another person eating meals?: None Help from another person taking care of personal grooming?: A Little Help from another person toileting, which includes using toliet, bedpan, or urinal?: A Little Help from another person bathing (including washing, rinsing, drying)?: A Little Help from another person to put on and taking off  regular upper body clothing?: None Help from another person to put on and taking off regular lower body clothing?: A Little 6 Click Score: 20    End of Session Equipment Utilized During Treatment: Gait belt;Rolling walker  OT Visit Diagnosis: Unsteadiness on feet (R26.81);Muscle weakness (generalized) (M62.81);Other symptoms and signs involving cognitive function   Activity Tolerance Patient tolerated treatment well   Patient Left in chair;with call bell/phone within reach;with chair alarm set   Nurse Communication Mobility status        Time: 6010-9323 OT Time Calculation (min): 18 min  Charges: OT General Charges $OT Visit: 1 Visit OT Treatments $Self Care/Home Management : 8-22 mins  Helene Kelp OTR/L Acute Rehabilitation Services Office: [place] 09/09/2020, 12:53 PM

## 2020-09-09 NOTE — Progress Notes (Signed)
Inpatient Diabetes Program Recommendations  AACE/ADA: New Consensus Statement on Inpatient Glycemic Control (2015)  Target Ranges:  Prepandial:   less than 140 mg/dL      Peak postprandial:   less than 180 mg/dL (1-2 hours)      Critically ill patients:  140 - 180 mg/dL   Lab Results  Component Value Date   GLUCAP 274 (H) 09/09/2020   HGBA1C 6.6 (H) 08/27/2020    Review of Glycemic Control Results for [name], [name] (MRN [id number]) as of 09/09/2020 13:37  Ref. Range 09/08/2020 06:36 09/08/2020 11:08 09/08/2020 12:48 09/08/2020 15:59 09/08/2020 20:59 09/09/2020 06:31 09/09/2020 08:26 09/09/2020 12:23  Glucose-Capillary Latest Ref Range: 70 - 99 mg/dL 163 (H) 172 (H) 236 (H) 221 (H) 233 (H) 155 (H) 133 (H) 274 (H)    Inpatient Diabetes Program Recommendations:    Increase meal coverage to 6 units TID with meals if eats at least 50% of meal.  Will continue to follow while inpatient.  Thank you, Reche Dixon, RN, BSN Diabetes Coordinator Inpatient Diabetes Program [phone] (team pager from 8a-5p)

## 2020-09-09 NOTE — TOC Transition Note (Signed)
Transition of Care [facility]) - CM/SW Discharge Note   Patient Details  Name: [name] MRN: [id number] Date of Birth: Sep 03, 1946  Transition of Care [facility] CM/SW Contact:  Zenon Mayo, RN Phone Number: 09/09/2020, 5:17 PM   Clinical Narrative:    He is active with [facility] , will continue with HHPT, [place], SW.  He will need transportation at dc. He has a 3 n 1 , and a walker  at home per patient. He states his daughter will be staying with him for a week after discharge.    Final next level of care: [place] Barriers to Discharge: Continued Medical Work up   Patient Goals and CMS Choice Patient states their goals for this hospitalization and ongoing recovery are:: get better CMS Medicare.gov Compare Post Acute Care list provided to:: Patient Choice offered to / list presented to : Patient  Discharge Placement                       Discharge Plan and Services   Discharge Planning Services: CM Consult Post Acute Care Choice: Resumption of Svcs/PTA Provider            DME Agency: NA       HH Arranged: PT,OT,Social Work [place] Date [place]: 09/09/20 Time [place]: [place] Representative spoke with at [place]: Amy  Social Determinants of Health ([place]) Interventions     Readmission Risk Interventions Readmission Risk Prevention Plan 09/09/2020  Transportation Screening Complete  Medication Review [employer]) Complete  PCP or Specialist appointment within 3-5 days of discharge Complete  HRI or [place] Complete  SW Recovery Care/Counseling Consult Complete  [place] Not Applicable  Some recent data might be hidden

## 2020-09-09 NOTE — Progress Notes (Signed)
Patient ID: [name], male   DOB: [birth year], 74 y.o.   MRN: [id number] S: Feels great today O:BP (!) 154/76 (BP Location: Right Arm)   Pulse (!) 51   Temp 98.2 F (36.8 C) (Oral)   Resp 17   Ht 5\' 8"  (1.727 m)   Wt 81.5 kg   SpO2 90%   BMI 27.32 kg/m   Intake/Output Summary (Last 24 hours) at 09/09/2020 1034 Last data filed at 09/09/2020 0441 Gross per 24 hour  Intake 240 ml  Output 800 ml  Net -560 ml   Intake/Output: I/O last 3 completed shifts: In: 240 [P.O.:240] Out: 800 [Urine:800]  Intake/Output this shift:  No intake/output data recorded. Weight change: 0.5 kg Gen: NAD CVS: bradycardia at 51, no rub Resp: cta Abd: +BS, soft, NT/ND Ext: left foot wound under dressing, no edema  Recent Labs  Lab 09/02/20 2113 09/03/20 0215 09/03/20 1638 09/04/20 0612 09/04/20 1618 09/06/20 0355 09/07/20 0810 09/08/20 0447 09/09/20 0447  NA 137 139  141 137 137  137 136 135 130* 133* 135  K 4.8 4.5  4.5 4.3 5.0  5.0 4.8 4.3 4.8 4.6 4.4  CL 108 109  110 104 104  104 101 102 97* 99 100  CO2 17* 19*  19* 24 22  22 25 27  20* 26 26  GLUCOSE 74 37*  38* 80 304*  301* 186* 84 383* 220* 172*  BUN 113* 96*  92* 58* 36*  38* 35* 36* 43* 53* 61*  CREATININE 2.90* 2.29*  2.36* 1.43* 1.34*  1.34* 1.32* 1.84* 2.14* 2.25* 2.22*  ALBUMIN 3.1* 3.0* 3.0* 2.9* 3.2*  --   --   --   --   CALCIUM 9.0 8.8*  8.8* 8.7* 8.4*  8.4* 8.6* 8.6* 8.5* 8.9 9.1  PHOS 3.6 2.8  2.8 2.1* 2.8  2.8 1.8* 3.2  --   --   --    Liver Function Tests: Recent Labs  Lab 09/03/20 1638 09/04/20 0612 09/04/20 1618  ALBUMIN 3.0* 2.9* 3.2*   No results for input(s): LIPASE, AMYLASE in the last 168 hours. Recent Labs  Lab 09/06/20 0355  AMMONIA 14   CBC: Recent Labs  Lab 09/03/20 0215 09/04/20 0612 09/05/20 0835 09/05/20 1410 09/06/20 0355 09/07/20 0810 09/08/20 0447 09/09/20 0447  WBC 7.4 7.5 10.0 9.2 10.3 10.0 11.4* 11.0*  NEUTROABS 6.3 6.5 7.2  --   --   --   --   --   HGB  8.6* 8.7* 8.5* 8.1* 7.7* 7.7* 7.2* 7.2*  HCT 28.8* 29.3* 28.1* 26.1* 25.8* 25.9* 24.0* 24.0*  MCV 94.1 94.5 92.7 90.6 92.1 91.2 93.0 93.4  PLT 194 166 216 211 188 196 203 196   Cardiac Enzymes: No results for input(s): CKTOTAL, CKMB, CKMBINDEX, TROPONINI in the last 168 hours. CBG: Recent Labs  Lab 09/08/20 1248 09/08/20 1559 09/08/20 2059 09/09/20 0631 09/09/20 0826  GLUCAP 236* 221* 233* 155* 133*    Iron Studies: No results for input(s): IRON, TIBC, TRANSFERRIN, FERRITIN in the last 72 hours. Studies/Results: No results found. . (feeding supplement) PROSource Plus  30 mL Oral BID BM  . allopurinol  100 mg Oral Michael  . aspirin EC  162 mg Oral BID  . atorvastatin  10 mg Oral Michael  . calcitRIOL  0.25 mcg Oral Michael  . carvedilol  6.25 mg Oral BID WC  . cephALEXin  500 mg Oral Q12H  . Chlorhexidine Gluconate Cloth  6 each Topical Michael  . feeding supplement (  NEPRO CARB STEADY)  237 mL Oral QHS  . ferrous sulfate  325 mg Oral Q breakfast  . heparin  5,000 Units Subcutaneous Q8H  . hydrALAZINE  25 mg Oral TID  . insulin aspart  0-5 Units Subcutaneous QHS  . insulin aspart  0-9 Units Subcutaneous TID WC  . insulin aspart  2 Units Subcutaneous TID WC  . insulin detemir  18 Units Subcutaneous Michael  . latanoprost  1 drop Both Eyes QHS  . linagliptin  5 mg Oral Michael  . mouth rinse  15 mL Mouth Rinse BID  . multivitamin  1 tablet Oral QHS  . mycophenolate  500 mg Oral BID  . predniSONE  30 mg Oral Michael  . sodium bicarbonate  650 mg Oral TID  . sodium chloride flush  3 mL Intravenous Q12H  . sodium chloride flush  3 mL Intravenous Q12H  . tacrolimus  1 mg Oral BID  . thiamine  100 mg Oral Michael    BMET    Component Value Date/Time   NA 135 09/09/2020 0447   NA 143 03/01/2019 0833   K 4.4 09/09/2020 0447   CL 100 09/09/2020 0447   CO2 26 09/09/2020 0447   GLUCOSE 172 (H) 09/09/2020 0447   BUN 61 (H) 09/09/2020 0447   BUN 51 (H) 03/01/2019 0833   CREATININE 2.22  (H) 09/09/2020 0447   CALCIUM 9.1 09/09/2020 0447   CALCIUM 10.0 02/27/2019 1235   GFRNONAA 31 (L) 09/09/2020 0447   GFRAA 47 (L) 06/06/2020 0400   CBC    Component Value Date/Time   WBC 11.0 (H) 09/09/2020 0447   RBC 2.57 (L) 09/09/2020 0447   HGB 7.2 (L) 09/09/2020 0447   HCT 24.0 (L) 09/09/2020 0447   PLT 196 09/09/2020 0447   MCV 93.4 09/09/2020 0447   MCH 28.0 09/09/2020 0447   MCHC 30.0 09/09/2020 0447   RDW 15.0 09/09/2020 0447   LYMPHSABS 1.1 09/05/2020 0835   MONOABS 1.2 (H) 09/05/2020 0835   EOSABS 0.1 09/05/2020 0835   BASOSABS 0.0 09/05/2020 0835     Assessment/Plan:  1. AKI/CKD stage IV s/p DDKT- AKI presumably due to ischemic ATN in setting of sepsis and hemodynamic changes related to hypertensive urgency that was present upon admission followed by profound hypotension (SBP in the 50's).  Baseline Scr 2-2.4 but rose to 3.4 and CRRT initiated on 12/15 due to AMS and +/- uremia.  CRRT stopped on 09/05/20 and transferred to [facility] for possible IHD, however has had marked increase in UOP.  Hopefully he is diuretic phase of recovery and will not require further RRT.  Will hold off removing temp HD cath for now and follow UOP and Michael Scr. 1. Scr back at his baseline of 2.2-2.4.   2. No indication for dialysis at this time. 3. Will continue to follow UOP and Michael Scr.  4. Will remove right fem HD cath today since his SCr is at baseline. 2. AMS/delirium- presumably due to infection and AKI.  Improving. 3. ESRD s/p DDKT in 2005.  Continue with prograf, cellcept, and prednisone 4. HTN/Volume- stable 5. Sepsis due to left foot cellulitis- resolved with IV ceftriaxone.  Per primary.  Now on oral keflex. Wound care consulted   [name], MD [facility] [phone]

## 2020-09-09 NOTE — Progress Notes (Signed)
R femoral [place] discontinued per MD order. Site held pressure for 45-50 minutes. No s/s of bleeding at this time. Educated patient to stay in bed for an hour after removing [place]. RN made aware.

## 2020-09-09 NOTE — TOC Initial Note (Signed)
Transition of Care [facility]) - Initial/Assessment Note    Patient Details  Name: [name] MRN: [id number] Date of Birth: 10-01-1945  Transition of [facility]) CM/SW Contact:    Zenon Mayo, RN Phone Number: 09/09/2020, 5:15 PM  Clinical Narrative:                 NCM spoke with patient at bedside, offered choice for [facility], he states his daughter will be with him 24 hrs  For a week and his wife will be there also he states he is active with [facility].  NCM confirmed this with Amy with [facility]. Patient states he does not want HHRN, but would like to continue with HHPT, [place], SW.  He states he has a 3 n 1 already at the home.  He will need transportation at dc.   Expected Discharge Plan: [place] Barriers to Discharge: Continued Medical Work up   Patient Goals and CMS Choice Patient states their goals for this hospitalization and ongoing recovery are:: get better CMS Medicare.gov Compare Post Acute Care list provided to:: Patient Choice offered to / list presented to : Patient  Expected Discharge Plan and Services Expected Discharge Plan: [place]   Discharge Planning Services: CM Consult Post Acute Care Choice: Resumption of Svcs/PTA Provider Living arrangements for the past 2 months: Single Family Home                   DME Agency: NA       HH Arranged: PT,OT,Social Work [employer] Agency: [facility] [place] Date [place]: 09/09/20 Time [place]: 1714 Representative spoke with at [place]: [place] Arrangements/Services Living arrangements for the past 2 months: [place] with:: Spouse Patient language and need for interpreter reviewed:: Yes Do you feel safe going back to the place where you live?: Yes      Need for Family Participation in Patient Care: Yes (Comment) Care giver support system in place?: Yes (comment) Current home services: Home OT,Home PT,Other (comment)  (social work) Architect Involvement Pertinent to Current Situation/Hospitalization: No - Comment as needed  Activities of Daily Living Home Assistive Devices/Equipment: CBG Meter ADL Screening (condition at time of admission) Patient's cognitive ability adequate to safely complete daily activities?: No Is the patient deaf or have difficulty hearing?: No Does the patient have difficulty seeing, even when wearing glasses/contacts?: No Does the patient have difficulty concentrating, remembering, or making decisions?: Yes Patient able to express need for assistance with ADLs?: No Does the patient have difficulty dressing or bathing?: Yes Independently performs ADLs?: No Communication: Needs assistance Is this a change from baseline?: Change from baseline, expected to last >3 days Dressing (OT): Needs assistance Grooming: Needs assistance Feeding: Needs assistance Bathing: Needs assistance Toileting: Needs assistance In/Out Bed: Needs assistance Walks in Home: Needs assistance Does the patient have difficulty walking or climbing stairs?: Yes Weakness of Legs: Both Weakness of Arms/Hands: Both  Permission Sought/Granted                  Emotional Assessment Appearance:: Appears stated age Attitude/Demeanor/Rapport: Engaged Affect (typically observed): Appropriate Orientation: : Oriented to Self,Oriented to Place,Oriented to  Time,Oriented to Situation Alcohol / Substance Use: Not Applicable Psych Involvement: No (comment)  Admission diagnosis:  Acute pulmonary edema ([place]) [J81.0] Hypertensive emergency [I16.1] Fever in adult [R50.9] Sepsis due to cellulitis ([place]) [L03.90, A41.9] Patient Active Problem List   Diagnosis Date Noted  .  Systolic and diastolic CHF, acute ([place]) 08/30/2020  . Idiopathic chronic gout of multiple sites without tophus   . Malnutrition of moderate degree 08/29/2020  . Hypertensive emergency 08/27/2020  . Anemia, unspecified 08/27/2020   . Chronic renal failure, stage 3b ([place]) 08/27/2020  . Renal transplant, status post 08/27/2020  . Diabetes mellitus type 2, controlled, with complications ([place]) 76/54/6503  . Diabetic nephropathy ([place]) 08/27/2020  . Pressure injury of skin 08/27/2020  . Sepsis due to cellulitis ([place]) 08/26/2020  . Chronic kidney disease, stage 3b ([place]) 08/26/2020  . Hypertensive urgency 08/26/2020  . Encounter for central line placement   . Altered mental status 05/26/2020  . Sepsis ([place]) 05/26/2020  . Hypernatremia 05/26/2020  . Increased anion gap metabolic acidosis 54/65/6812  . Acute encephalopathy 09/06/2018  . Anemia due to chronic kidney disease 09/06/2018  . Diabetes mellitus type 2, insulin dependent ([place]) 09/06/2018  . Community acquired pneumonia 09/06/2018  . Chronic diastolic heart failure ([place]) 03/25/2017  . Essential hypertension 03/25/2017  . Chronic anticoagulation 02/04/2017  . Dyspnea 02/03/2017  . 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 Kidney transplant status, cadaveric 10/15/2015  . Immunosuppressed status ([employer]) 10/15/2015  . Paronychia of third finger of right hand 07/16/2011  . ESRD (end stage renal disease) ([place]) 07/16/2011   PCP:  [name], MD Pharmacy:   [id number] [place], [address] [place] [address] Phone: [phone] Fax: [phone]  [facility] [place], [address] [place] [address] Phone: [phone] Fax: [phone]     Social Determinants of Health (SDOH) Interventions    Readmission Risk Interventions Readmission Risk Prevention Plan 09/09/2020  Transportation Screening Complete  Medication Review ([place]) Complete  PCP or Specialist appointment within 3-5 days of discharge Complete  HRI or [place] Complete  SW Recovery Care/Counseling Consult Complete  [place] Not Applicable  Some recent data might be hidden

## 2020-09-10 ENCOUNTER — Encounter (HOSPITAL_COMMUNITY): Payer: Medicare Other

## 2020-09-10 LAB — CBC
HCT: 21.5 % — ABNORMAL LOW (ref 39.0–52.0)
Hemoglobin: 6.7 g/dL — CL (ref 13.0–17.0)
MCH: 28.6 pg (ref 26.0–34.0)
MCHC: 31.2 g/dL (ref 30.0–36.0)
MCV: 91.9 fL (ref 80.0–100.0)
Platelets: 174 10*3/uL (ref 150–400)
RBC: 2.34 MIL/uL — ABNORMAL LOW (ref 4.22–5.81)
RDW: 15.1 % (ref 11.5–15.5)
WBC: 9.9 10*3/uL (ref 4.0–10.5)
nRBC: 0 % (ref 0.0–0.2)

## 2020-09-10 LAB — GLUCOSE, CAPILLARY
Glucose-Capillary: 97 mg/dL (ref 70–99)
Glucose-Capillary: 149 mg/dL — ABNORMAL HIGH (ref 70–99)
Glucose-Capillary: 326 mg/dL — ABNORMAL HIGH (ref 70–99)
Glucose-Capillary: 529 mg/dL (ref 70–99)

## 2020-09-10 LAB — RENAL FUNCTION PANEL
Albumin: 2.7 g/dL — ABNORMAL LOW (ref 3.5–5.0)
Anion gap: 9 (ref 5–15)
BUN: 70 mg/dL — ABNORMAL HIGH (ref 8–23)
CO2: 24 mmol/L (ref 22–32)
Calcium: 8.9 mg/dL (ref 8.9–10.3)
Chloride: 101 mmol/L (ref 98–111)
Creatinine, Ser: 2.24 mg/dL — ABNORMAL HIGH (ref 0.61–1.24)
GFR, Estimated: 30 mL/min — ABNORMAL LOW (ref >60)
Glucose, Bld: 194 mg/dL — ABNORMAL HIGH (ref 70–99)
Phosphorus: 3.1 mg/dL (ref 2.5–4.6)
Potassium: 4.9 mmol/L (ref 3.5–5.1)
Sodium: 134 mmol/L — ABNORMAL LOW (ref 135–145)

## 2020-09-10 LAB — HEMOGLOBIN AND HEMATOCRIT, BLOOD
HCT: 23.4 % — ABNORMAL LOW (ref 39.0–52.0)
Hemoglobin: 7.3 g/dL — ABNORMAL LOW (ref 13.0–17.0)

## 2020-09-10 MED ORDER — RENA-VITE PO TABS: 1.0000 | ORAL_TABLET | Freq: Every day | ORAL | 0 refills | Status: AC

## 2020-09-10 MED ORDER — CEPHALEXIN 500 MG PO CAPS: 500.0000 mg | ORAL_CAPSULE | Freq: Two times a day (BID) | ORAL | 0 refills | Status: AC

## 2020-09-10 MED ORDER — THIAMINE HCL 100 MG PO TABS: 100.0000 mg | ORAL_TABLET | Freq: Every day | ORAL | 0 refills | Status: DC

## 2020-09-10 MED ORDER — FUROSEMIDE 40 MG PO TABS: 80.0000 mg | ORAL_TABLET | Freq: Every day | ORAL | Status: DC

## 2020-09-10 MED ORDER — FERROUS SULFATE 325 (65 FE) MG PO TABS: 325.0000 mg | ORAL_TABLET | Freq: Every day | ORAL | 0 refills | Status: AC

## 2020-09-10 MED ORDER — INSULIN ASPART 100 UNIT/ML ~~LOC~~ SOLN
9.0000 [IU] | Freq: Once | SUBCUTANEOUS | Status: AC
  Administered 2020-09-10: 22:00:00 9 [IU] via SUBCUTANEOUS

## 2020-09-10 MED ORDER — SODIUM BICARBONATE 650 MG PO TABS: 650.0000 mg | ORAL_TABLET | Freq: Three times a day (TID) | ORAL | 0 refills | Status: AC

## 2020-09-10 NOTE — Progress Notes (Signed)
Patient's femoral IV access was pulled previously, kidney function stable, and dialysis is no longer needed, however patient has no IV access since it was pulled, patient has refused placing IV access in his arm, I have reviewed the necessity for IV access in case of emergency, but patient has still refused, is hard stick and arms are very bruised

## 2020-09-10 NOTE — Discharge Summary (Addendum)
Physician Discharge Summary  Stanly Si OEV:[id number] DOB: 10/23/1945 DOA: 08/26/2020  PCP: [name], MD  Admit date: 08/26/2020 Discharge date: 09/11/2020  Admitted From: Home Disposition: Home with [facility] PT and RN  Recommendations for Outpatient Follow-up:  1. Follow up with PCP in 1-2 weeks 2. Please obtain BMP/CBC in one week   Home Health:Yes Equipment/Devices:Patient reports that he already has a 3:1 at home.   Discharge Condition:Stable.  CODE STATUS:Full Diet recommendation:Renal diet, carb mod.   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  Patient was found to be febrile on arrival to the ED.  Noncontrast CT head negative for acute intracranial abnormalities.  Chest x-ray showed cardiomegaly with mild diffuse interstitial edema.  X-ray of the left foot show soft tissue swelling around the left fifth MTP without acute bony abnormality.  Lactic acid 1.9 with leukocytosis and thrombocytopenia.  Patient was admitted for sepsis secondary to left foot cellulitis.  Orthopedic think patient's left foot pain is secondary to gout.  No osteomyelitis.  Patient was hypertensive on arrival and became hypotensive after getting nitroglycerin drip with a systolic blood pressure running in the 50s.  Blood pressure now stable.  Patient serum creatinine worsened to 3.4 on 12/14.  He was a started on CRRT due to uremic symptoms.  Lab improving but patient continued to be confused.  Nephrology recommended transfer to [facility] for in case he required  intermittent hemodialysis.   On the day of discharge: Renal function has improved, HD not longer  recommended, HD cath removed on 09/09/20.  He reports feeling better gain today.  Received 1 unit of pRBC transfusion (had refused this on 12/22 but agreeable to have the transfusion today after talking to Dr. Marval Regal). Hgb of 6.7 this morning.  He will continue iron supplementation and will follow up with Nephrology for further monitoring of his Hgb.  He agrees to [facility], and [facility] PT has been ordered for his physical debility. HH RN has been ordered for medication and cardiovascular management as well as monitoring of his left foot cellulitis which appears to be almost resolved. He will have prescription for 1 more day of Keflex for the cellulitis.  He will continue Lasix 40mg  po daily at the time of discharge with no dose change to his immunosuppressant medications.  He will start rena Vite, sodium bicarb, and nutritional supplementation at the time of discharge.    Please see below for further details for this hospital course prior to today:   1-Sepsis secondary to left foot cellulitis: -Patient presented with fever, tachypnea respiration more than 20 source of infection cellulitis of bilateral lower extremity. -Patient received IV antibiotics, 5 days of ceftriaxone -MRI of the left foot: Prominent erosion first M TP, base of the distal phalanx fourth toe, but no clear evidence of osteomyelitis.  No drainable fluid collection.  Circumferential edema in the forefoot, cellulitis not excluded. -Lactic acid 1.9, procalcitonin 2.1 -Dr. Dwyane Dee discussed with Dr. Sharol Given from orthopedic, patient lower extremity is secondary to acute gout and no surgical intervention is warranted. -Sepsis physiology resolved. -Cellulitis improved, mild  drainage form second toe, Added  Keflex. Plan to complete 2 more  days.  Wound care consulted  -Continue with wound care. Wound looks better.  -  WBC stable.   2-Metabolic encephalopathy; related to sepsis, metabolic.  -CT head negative. -RPR, TSH, vitamin B12 and ammonia  normal. -Holding sedative -Patient received CRRT, labs improving but he continued to be confused. -Patient continues to be on soft restraint and belly belt due to high risk for fall and aggression.  -Continue with Haldol as needed -Per nephrology, encephalopathy not  related to uremia. -Continue with Thiamine and folic acid.  -Patient MS is improved. He is alert and conversant. He was able to ambulate today with PT>   3-AKI on CKD stage IIIb, renal transplant x2 (1983, 2005) Related to sepsis/hemodynamic changes.  Nephrology following. Cr baseline 1.2--2 No plan for HD for now.  Received CRRT during this admission.  Continue with Cellcept, prednisone, prograf.  Urine out put improved.  Cr stable at 2.2. Plan to remove temporary  HD catheter today.  Will need to follow nephrologist recommendation for Diuretics for discharge.   Anemia of chronic kidney disease: Hemoglobin baseline 9. Hb down to 7. On oral iron. Will discussed with nephrology about aranesp.    -Chest pain: Resolved. Hypertensive urgency: Improved On hydralazine, holder parameter to prevent hypotension.  Decrease hydralazine to 25 mg, decrease carvedilol to home dose, to avoid bradycardia.   Acute systolic and diastolic heart failure ejection fraction 40 to 45%. 12/13; Cardiology recommend follow up with Dr Tamala Julian outpatient.   Hypotension: Related to medication resolved  Pleural effusion: Moderate pleural effusion seen on echocardiogram.  Diabetes type 2  with hypoglycemia Hyperglycemia. Discontinue D 10. Resume levemir.  Added meals coverage.  On SSI.  Resume tradjenta.  Continue with levemir.  CBG better controlled.   Gout: Uric acid at 10. Continue with prednisone, started  taper.  Repeat uric acid. Down to 6  Hypomagnesemia: Improved Hyperkalemia: Resolved Bilateral arm pain chronic: Dopplers negative. Moderate malnutrition; continue with supplement  Pressure Injury 08/27/20 Sacrum  Posterior;Mid Stage 1 -  Intact skin with non-blanchable redness of a localized area usually over a bony prominence. 2" X 1" small stage 1 sacral PI (Active)  08/27/20 1230  Location: Sacrum  Location Orientation: Posterior;Mid  Staging: Stage 1 -  Intact skin with non-blanchable redness of a localized area usually over a bony prominence.  Wound Description (Comments): 2" X 1" small stage 1 sacral PI  Present on Admission: Yes     Nutrition Problem: Moderate Malnutrition Etiology: chronic illness (DM with neuropathy)    Signs/Symptoms: mild fat depletion,mild muscle depletion,moderate muscle depletion    Interventions: Juven,Prostat,MVI  Estimated body mass index is 27.32 kg/m as calculated from the following:   Height as of this encounter: 5\' 8"  (1.727 m).   Weight as of this encounter: 81.5 kg.       Discharge Diagnoses:  Principal Problem:   Sepsis due to cellulitis [facility]) Active Problems:   Kidney transplant status, cadaveric   Essential hypertension   Acute encephalopathy   Anemia due to chronic kidney disease   Diabetes mellitus type 2, insulin dependent (HCC)   Chronic kidney disease, stage 3b (HCC)   Hypertensive emergency   Anemia, unspecified   Chronic renal failure, stage 3b ([place])   Renal transplant, status post   Diabetes mellitus type 2, controlled, with complications ([place])   Diabetic nephropathy ([place])   Pressure injury of skin   Malnutrition of moderate degree   Idiopathic chronic gout of multiple sites without tophus   Systolic and diastolic CHF, acute [facility])    Discharge Instructions  Discharge Instructions    Face-to-face  encounter (required for Medicare/Medicaid patients)   Complete by: As directed    I Blain Pais certify that this patient is under my care and that I, or a nurse practitioner or physician's assistant working with me, had a face-to-face encounter that meets the physician face-to-face encounter requirements  with this patient on 09/10/2020. The encounter with the patient was in whole, or in part for the following medical condition(s) which is the primary reason for home health care (List medical condition): physical debility   The encounter with the patient was in whole, or in part, for the following medical condition, which is the primary reason for home health care: physical debility, leg cellulitis   I certify that, based on my findings, the following services are medically necessary home health services:  Nursing Physical therapy     Reason for Medically Necessary Home Health Services: Skilled Nursing- Changes in Medication/Medication Management   My clinical findings support the need for the above services: Unable to leave home safely without assistance and/or assistive device   Further, I certify that my clinical findings support that this patient is homebound due to: Unable to leave home safely without assistance   Home Health   Complete by: As directed    To provide the following care/treatments:  PT RN       Allergies as of 09/11/2020      Reactions   Elavil [amitriptyline Hcl] Other (See Comments)   Unknown felt as if he was tripping      Medication List    STOP taking these medications   diphenhydrAMINE 25 mg capsule Commonly known as: BENADRYL   doxycycline 100 MG capsule Commonly known as: VIBRAMYCIN   oxyCODONE-acetaminophen 5-325 MG tablet Commonly known as: PERCOCET/ROXICET     TAKE these medications   acetaminophen 500 MG tablet Commonly known as: TYLENOL Take 1,000 mg by mouth every 6 (six) hours as needed for mild pain or moderate pain.   allopurinol 100 MG tablet Commonly known as: Zyloprim Take 1 tablet (100 mg total) by mouth daily.   aspirin 81 MG tablet Take 162 mg by mouth 2 (two) times daily.   atorvastatin 10 MG tablet Commonly known as: LIPITOR Take 10 mg by mouth daily.   B-D SINGLE USE SWABS REGULAR Pads Use as directed   Bayer Contour  Next Test test strip Generic drug: glucose blood Use as directed   BD Pen Needle Nano U/F 32G X 4 MM Misc Generic drug: Insulin Pen Needle Use as directed   calcitRIOL 0.25 MCG capsule Commonly known as: ROCALTROL Take 0.25 mcg by mouth daily.   carvedilol 6.25 MG tablet Commonly known as: COREG Take 1 tablet (6.25 mg total) by mouth 2 (two) times daily with a meal.   cephALEXin 500 MG capsule Commonly known as: KEFLEX Take 1 capsule (500 mg total) by mouth every 12 (twelve) hours.   feeding supplement (NEPRO CARB STEADY) Liqd Take 237 mLs by mouth at bedtime.   ferrous sulfate 325 (65 FE) MG tablet Take 1 tablet (325 mg total) by mouth daily with breakfast.   furosemide 40 MG tablet Commonly known as: LASIX Take 1 tablet (40 mg total) by mouth daily. What changed: how much to take   gabapentin 100 MG capsule Commonly known as: NEURONTIN Take 100 mg by mouth See admin instructions. Take 1 capule (100mg ) three times/day as needed for neuropathy or 1 capsule (100mg ) at bedtime   HumaLOG KwikPen 100 UNIT/ML KwikPen Generic drug: insulin lispro Inject  3-5 Units into the skin 3 (three) times daily. Sliding scale   hydrALAZINE 25 MG tablet Commonly known as: APRESOLINE Take 25 mg by mouth 3 (three) times daily.   Lantus SoloStar 100 UNIT/ML Solostar Pen Generic drug: insulin glargine Inject 7-15 Units into the skin daily. If blood sugars are >300, use 15 units, <300 use 7 units   latanoprost 0.005 % ophthalmic solution Commonly known as: XALATAN Place 1 drop into both eyes at bedtime.   Microlet Lancets Misc Use as directed   multivitamin Tabs tablet Take 1 tablet by mouth daily.   mycophenolate 500 MG tablet Commonly known as: CELLCEPT Take 500 mg by mouth 2 (two) times daily.   predniSONE 5 MG tablet Commonly known as: DELTASONE Take 5 mg by mouth daily.   senna-docusate 8.6-50 MG tablet Commonly known as: Senokot-S Take 1 tablet by mouth at bedtime.    sodium bicarbonate 650 MG tablet Take 1 tablet (650 mg total) by mouth 3 (three) times daily.   tacrolimus 1 MG capsule Commonly known as: PROGRAF Take 1 mg by mouth 2 (two) times daily.   Tradjenta 5 MG Tabs tablet Generic drug: linagliptin Take 5 mg by mouth daily.   Vascepa 1 g capsule Generic drug: icosapent Ethyl Take 2 g by mouth 2 (two) times daily.            Durable Medical Equipment  (From admission, onward)         Start     Ordered   09/08/20 0816  For home use only DME 3 n 1  Once        09/08/20 0816           Discharge Care Instructions  (From admission, onward)         Start     Ordered   09/11/20 0000  Discharge wound care:       Comments: Wound care to left foot, moisture lesions between toes (particularly second and third digits, but also 4th and 5th digits): wash and dry foot gently, but thoroughly. Place 1/2 inch strips cut from a 4x4 inch piece of silver hydrofiber dressing (Aquacel Advantage, Lawson # [id number]) between toes and over lesions.  Top with dry gauze. Secure with paper tape or with a few turns of Kerlix roll gauze.  Change daily.   09/11/20 1906          Follow-up Information    [name], MD. Schedule an appointment as soon as possible for a visit in 2 week(s).   Specialty: Nephrology Contact information: [place] [address] [phone]        Belva Crome, MD .   Specialty: Cardiology Contact information: [phone] [place] [address] [phone]              Allergies  Allergen Reactions  . Elavil [Amitriptyline Hcl] Other (See Comments)    Unknown felt as if he was tripping    Consultations:  Nephrology  Cardiology   Orhopedic   Procedures/Studies: CT ABDOMEN PELVIS WO CONTRAST  Result Date: 08/26/2020 CLINICAL DATA:  Acute abdominal and back pain, sepsis; history chronic kidney disease, prior renal transplant, CHF, diabetes mellitus,  hypertension, atrial fibrillation EXAM: CT ABDOMEN AND PELVIS WITHOUT CONTRAST TECHNIQUE: Multidetector CT imaging of the abdomen and pelvis was performed following the standard protocol without IV contrast. Sagittal and coronal MPR images reconstructed from axial data set. Neither oral nor intravenous contrast were administered. Patient respiratory motion  artifacts degrade exam. COMPARISON:  05/26/2020 FINDINGS: Lower chest: Small bibasilar pleural effusions and compressive atelectasis in the RIGHT lower lobe. Pericardial calcification. Enlargement of cardiac chambers. Hepatobiliary: Gallbladder and liver normal appearance Pancreas: Normal appearance Spleen: Normal appearance Adrenals/Urinary Tract: Atrophic native kidneys. Small RIGHT renal cysts. Old calcified renal transplant in RIGHT iliac fossa. Transplant kidney LEFT iliac fossa without mass or hydronephrosis. Decompressed bladder. Stomach/Bowel: Scattered colonic diverticulosis. No definite CT evidence of acute diverticulitis. Stomach and bowel loops otherwise unremarkable. Vascular/Lymphatic: Extensive atherosclerotic calcifications aorta, iliac arteries, and femoral arteries. Additional coronary arterial and extensive small vessel vascular calcifications throughout abdomen and pelvis. Aorta normal caliber. No adenopathy. Reproductive: Unremarkable prostate gland and seminal vesicles Other: Scattered stranding of tissue planes in abdominal wall, mesentery, and pelvis. No free air or free fluid. No hernia. Musculoskeletal: Diffuse osseous demineralization. IMPRESSION: Small bibasilar pleural effusions and compressive atelectasis in the RIGHT lower lobe. Pericardial calcification. Atrophic native kidneys with old calcified renal transplant in RIGHT iliac fossa. Transplant kidney LEFT iliac fossa without mass or hydronephrosis. Scattered colonic diverticulosis without evidence of diverticulitis. No definite acute intra-abdominal or intrapelvic abnormalities.  Aortic Atherosclerosis (ICD10-I70.0). Electronically Signed   By: Lavonia Dana M.D.   On: 08/26/2020 19:41   CT Head Wo Contrast  Result Date: 08/26/2020 CLINICAL DATA:  74 year old male with altered mental status. Atrial fibrillation. Diabetic foot wound. Fever. EXAM: CT HEAD WITHOUT CONTRAST TECHNIQUE: Contiguous axial images were obtained from the base of the skull through the vertex without intravenous contrast. COMPARISON:  Brain MRI 05/28/2020.  Head CT 05/26/2020 and earlier. FINDINGS: Brain: Cerebral volume is stable and within normal limits. No midline shift, ventriculomegaly, mass effect, evidence of mass lesion, intracranial hemorrhage or evidence of cortically based acute infarction. Stable gray-white matter differentiation, within normal limits for age. Vascular: Extensive Calcified atherosclerosis at the skull base. No suspicious intracranial vascular hyperdensity. Dolichoectatic distal right vertebral artery. Skull: No acute osseous abnormality identified. Sinuses/Orbits: Tympanic cavities and mastoids remain clear. Stable mild paranasal sinus mucosal thickening. Other: Calcified scalp vessel atherosclerosis. No acute orbit or scalp soft tissue finding. IMPRESSION: 1. No acute intracranial abnormality. Stable and negative for age non contrast CT appearance of the brain. 2. Very advanced calcified atherosclerosis. Electronically Signed   By: Genevie Ann M.D.   On: 08/26/2020 18:37   MR FOOT LEFT WO CONTRAST  Result Date: 08/28/2020 CLINICAL DATA:  Osteomyelitis of the foot EXAM: MRI OF THE LEFT FOOT WITHOUT CONTRAST TECHNIQUE: Multiplanar, multisequence MR imaging of the left forefoot was performed. No intravenous contrast was administered. COMPARISON:  Radiographs 08/26/2020 FINDINGS: Despite efforts by the technologist and patient, severe motion artifact is present on today's exam and could not be eliminated. This reduces exam sensitivity and specificity. The patient terminated the exam prior to  the acquisition of sagittal images and would not allow repeat series to correct for the severely motion degraded images. This reduces overall exam sensitivity and specificity. Bones/Joint/Cartilage Irregular erosions along the articular surfaces of the first MTP joint. Prominent erosions and irregularity in the distal half of the proximal phalanx of the great toe, and in the base of the distal phalanx. No overt malalignment at the Lisfranc joint. There are degenerative findings along the Lisfranc joint. No bony destructive findings at the fifth MTP joint identified. No joint effusion is identified. The phalanges of the fifth toe are somewhat obscured by motion artifact. My sense is that there is probably some soft tissue swelling in the small toe but on the images not  severely adversely affected by motion artifact, I not see clear evidence of osteomyelitis. Small erosion of the base of the distal phalanx fourth toe. Ligaments The Lisfranc ligament appears to be intact. Muscles and Tendons Generalized muscular atrophy in the forefoot. Low-grade edema is likely neurogenic. Soft tissues Circumferential edema in the forefoot but especially dorsally, cellulitis is not excluded. No drainable fluid collection is identified in the soft tissues. IMPRESSION: 1. Prominent erosions and irregularity along the articular surfaces of the first MTP joint and in the distal half of the proximal phalanx of the great toe, and in the base of the distal phalanx fourth toe. The phalanges of the fifth toe are somewhat obscured by motion artifact, but I not see clear evidence of osteomyelitis. 2. Small erosion of the base of the distal phalanx fourth toe. 3. Circumferential edema in the forefoot, especially dorsally, cellulitis is not excluded. No drainable fluid collection is identified. 4. Generalized muscular atrophy in the forefoot. Low-grade edema is likely neurogenic. 5. Despite efforts by the technologist and patient, severe motion  artifact is present on today's exam and could not be eliminated. This reduces exam sensitivity and specificity. The patient terminated the exam prior to the acquisition of sagittal images and would not allow repeat series to correct for the severely motion degraded images. Electronically Signed   By: Van Clines M.D.   On: 08/28/2020 13:36   DG CHEST PORT 1 VIEW  Result Date: 08/31/2020 CLINICAL DATA:  Pleural effusion EXAM: PORTABLE CHEST 1 VIEW COMPARISON:  08/26/2020 and prior. FINDINGS: Cardiomegaly and central pulmonary vascular congestion. Patchy bibasilar opacities, more conspicuous on the left. No pneumothorax. Likely small left pleural effusion however the left costophrenic sulcus is partially imaged. IMPRESSION: Small left pleural effusion.  Increased bibasilar opacities. Cardiomegaly and central pulmonary vascular congestion. Electronically Signed   By: Primitivo Gauze M.D.   On: 08/31/2020 07:42   DG Chest Port 1 View  Result Date: 08/26/2020 CLINICAL DATA:  Soft tissue ulceration of the foot EXAM: PORTABLE CHEST 1 VIEW COMPARISON:  05/29/2020 FINDINGS: Cardiomegaly. Mild, diffuse interstitial pulmonary opacity. Osseous structures are unremarkable. IMPRESSION: Cardiomegaly and mild, diffuse interstitial pulmonary opacity, likely edema. No focal airspace opacity. Electronically Signed   By: Eddie Candle M.D.   On: 08/26/2020 16:51   DG Foot Complete Left  Result Date: 08/26/2020 CLINICAL DATA:  Soft tissue ulcer of the fifth digit. EXAM: LEFT FOOT - COMPLETE 3+ VIEW COMPARISON:  None. FINDINGS: Extensive microvascular calcifications are present, compatible with diabetes. Soft swelling is present about the fifth MTP joint. No rows of changes are present. No gas is evident within the soft tissues. No other acute or focal osseous abnormalities are present. IMPRESSION: 1. Soft tissue swelling about the fifth MTP joint without underlying osseous abnormality. 2. No radiopaque foreign  body or gas in the soft tissues. 3. Extensive microvascular calcifications compatible with diabetes. Electronically Signed   By: San Morelle M.D.   On: 08/26/2020 16:52   VAS [place] ABI WITH/WO TBI  Result Date: 08/31/2020 LOWER EXTREMITY DOPPLER STUDY Indications: Ulceration- chronic LT foot wound. High Risk Factors: Hypertension, hyperlipidemia, Diabetes.  Comparison Study: 10-12-2019 Abdominal aortogram w/ lower extremity study                   08-09-2019 ABI w/ TBI Performing Technologist: Darlin Coco RDMS  Examination Guidelines: A complete evaluation includes at minimum, Doppler waveform signals and systolic blood pressure reading at the level of bilateral brachial, anterior tibial, and posterior tibial  arteries, when vessel segments are accessible. Bilateral testing is considered an integral part of a complete examination. Photoelectric Plethysmograph (PPG) waveforms and toe systolic pressure readings are included as required and additional duplex testing as needed. Limited examinations for reoccurring indications may be performed as noted.  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                  +---------+------------------+-----+----------+--------------------------------+ PTA      255               2.26 monophasic                                 +---------+------------------+-----+----------+--------------------------------+ DP       255               2.26 monophasic                                 +---------+------------------+-----+----------+--------------------------------+ Great Toe                       Absent                                      +---------+------------------+-----+----------+--------------------------------+ +---------+------------------+-----+----------+-------+ Left     Lt Pressure (mmHg)IndexWaveform  Comment +---------+------------------+-----+----------+-------+ Brachial 113                    triphasic         +---------+------------------+-----+----------+-------+ PTA      255               2.26 monophasic        +---------+------------------+-----+----------+-------+ DP       255               2.26 monophasic        +---------+------------------+-----+----------+-------+ Great Toe                       Absent            +---------+------------------+-----+----------+-------+ +-------+-----------+-----------+------------+------------+ ABI/TBIToday's ABIToday's TBIPrevious ABIPrevious TBI +-------+-----------+-----------+------------+------------+ Right  [place]         Absent     [place]          0.40         +-------+-----------+-----------+------------+------------+ Left   [place]         Absent     [place]          0.24         +-------+-----------+-----------+------------+------------+ Arterial wall calcification precludes accurate ankle pressures and ABIs. Right ABIs appear essentially unchanged compared to prior study on 08-09-2019. Left  ABIs appear essentially unchanged compared to prior study on 08-09-2019.  Summary: Right: Resting right ankle-brachial index indicates noncompressible right lower extremity arteries. Absent TBI. Left: Resting left ankle-brachial index indicates noncompressible left lower extremity arteries. Absent TBI.  *See table(s) above for measurements and observations.  Electronically signed by Jamelle Haring on 08/31/2020 at 1:09:33 PM.    Final    ECHOCARDIOGRAM COMPLETE  Result Date: 08/29/2020    ECHOCARDIOGRAM REPORT   Patient Name:   TALIESIN HARTLAGE Date of Exam: 08/29/2020 Medical Rec #:  [id number]       Height:       69.0 in Accession  #:    [id number]      Weight:       190.0 lb Date of Birth:  [birth year]      BSA:          2.022 m Patient Age:    75 years        BP:           148/62 mmHg Patient Gender: M               HR:           75 bpm. Exam Location:  Inpatient Procedure: 2D Echo, Cardiac Doppler and Color Doppler Indications:    I50.23 Acute on chronic systolic (congestive) heart failure  History:        Patient has prior history of Echocardiogram examinations, most                 recent 05/26/2020. Arrythmias:Atrial Fibrillation; Risk                 Factors:Hypertension, Diabetes and Dyslipidemia. CKD.  Sonographer:    Jonelle Sidle Dance Referring Phys: [id number] [place]  1. Left ventricular ejection fraction, by estimation, is 40 to 45%. The left ventricle has mildly decreased function. The left ventricle demonstrates regional wall motion abnormalities (see scoring diagram/findings for description). There is moderate left ventricular hypertrophy. Left ventricular diastolic parameters are consistent with Grade III diastolic dysfunction (restrictive).  2. Right ventricular systolic function is normal. The right ventricular size is normal.  3. Left atrial size was moderately dilated.  4. Right atrial size was mildly dilated.  5. Moderate pleural effusion.  6. The mitral valve is normal in structure. No evidence of mitral valve regurgitation. No evidence of mitral stenosis. Moderate mitral annular calcification.  7. The aortic valve is normal in structure. Aortic valve regurgitation is mild. No aortic stenosis is present.  8. Aortic dilatation noted. There is mild dilatation at the level of the sinuses of Valsalva, measuring 43 mm. There is moderate dilatation of the ascending aorta, measuring 45 mm.  9. The inferior vena cava is normal in size with greater than 50% respiratory variability, suggesting right atrial pressure of 3 mmHg. Comparison(s): No significant change from prior study. Prior images reviewed side by side.  FINDINGS  Left Ventricle: Left ventricular ejection fraction, by estimation, is 40 to 45%. The left ventricle has mildly decreased function. The left ventricle demonstrates regional wall motion abnormalities. The left ventricular internal cavity size was normal in size. There is moderate left ventricular hypertrophy. Left ventricular diastolic parameters are consistent with Grade III diastolic dysfunction (restrictive).  LV Wall Scoring: The inferior wall is akinetic. Right Ventricle: The right ventricular size is normal. No increase in right ventricular wall thickness. Right ventricular systolic function is normal. Left Atrium: Left atrial size was moderately dilated. Right Atrium: Right atrial size  was mildly dilated. Pericardium: The pericardium was not assessed. Mitral Valve: The mitral valve is normal in structure. Moderate mitral annular calcification. No evidence of mitral valve regurgitation. No evidence of mitral valve stenosis. Tricuspid Valve: The tricuspid valve is normal in structure. Tricuspid valve regurgitation is not demonstrated. No evidence of tricuspid stenosis. Aortic Valve: The aortic valve is normal in structure. Aortic valve regurgitation is mild. Aortic regurgitation PHT measures 505 msec. No aortic stenosis is present. Pulmonic Valve: The pulmonic valve was normal in structure. Pulmonic valve regurgitation is not visualized. No evidence of pulmonic stenosis. Aorta: Aortic dilatation noted. There is mild dilatation at the level of the sinuses of Valsalva, measuring 43 mm. There is moderate dilatation of the ascending aorta, measuring 45 mm. Venous: The inferior vena cava is normal in size with greater than 50% respiratory variability, suggesting right atrial pressure of 3 mmHg. IAS/Shunts: No atrial level shunt detected by color flow Doppler. Additional Comments: There is a moderate pleural effusion.  LEFT VENTRICLE PLAX 2D LVIDd:         4.60 cm  Diastology LVIDs:         3.50 cm  LV e'  medial:    7.83 cm/s LV PW:         1.40 cm  LV E/e' medial:  18.8 LV IVS:        1.20 cm  LV e' lateral:   10.10 cm/s LVOT diam:     2.40 cm  LV E/e' lateral: 14.6 LV SV:         69 LV SV Index:   34 LVOT Area:     4.52 cm  RIGHT VENTRICLE            IVC RV Basal diam:  2.80 cm    IVC diam: 2.70 cm RV S prime:     7.72 cm/s TAPSE (M-mode): 1.4 cm LEFT ATRIUM              Index       RIGHT ATRIUM           Index LA diam:        4.00 cm  1.98 cm/m  RA Area:     21.50 cm LA Vol (A2C):   107.0 ml 52.93 ml/m RA Volume:   71.80 ml  35.52 ml/m LA Vol (A4C):   76.2 ml  37.69 ml/m LA Biplane Vol: 97.1 ml  48.03 ml/m  AORTIC VALVE LVOT Vmax:   94.80 cm/s LVOT Vmean:  60.400 cm/s LVOT VTI:    0.152 m AI PHT:      505 msec  AORTA Ao Root diam: 4.30 cm Ao Asc diam:  4.50 cm MITRAL VALVE MV Area (PHT): 5.27 cm     SHUNTS MV Decel Time: 144 msec     Systemic VTI:  0.15 m MV E velocity: 147.00 cm/s  Systemic Diam: 2.40 cm MV A velocity: 48.00 cm/s MV E/A ratio:  3.06 Candee Furbish MD Electronically signed by Candee Furbish MD Signature Date/Time: 08/29/2020/4:02:57 PM    Final    VAS [place] UPPER EXTREMITY VENOUS DUPLEX  Result Date: 09/01/2020 UPPER VENOUS STUDY  Indications: Pain, and Swelling Comparison Study: no prior Performing Technologist: Abram Sander RVS  Examination Guidelines: A complete evaluation includes B-mode imaging, spectral Doppler, color Doppler, and power Doppler as needed of all accessible portions of each vessel. Bilateral testing is considered an integral part of a complete examination. Limited examinations for reoccurring indications may be performed as noted.  Left Findings: +----------+------------+---------+-----------+----------+-----------------+ LEFT      CompressiblePhasicitySpontaneousProperties     Summary      +----------+------------+---------+-----------+----------+-----------------+ IJV           Full       Yes       Yes                                 +----------+------------+---------+-----------+----------+-----------------+ Subclavian    Full       Yes       Yes                                +----------+------------+---------+-----------+----------+-----------------+ Axillary      Full       Yes       Yes                                +----------+------------+---------+-----------+----------+-----------------+ Brachial      Full       Yes       Yes                                +----------+------------+---------+-----------+----------+-----------------+ Radial        Full                                                    +----------+------------+---------+-----------+----------+-----------------+ Ulnar         Full                                                    +----------+------------+---------+-----------+----------+-----------------+ Cephalic      None                                  Age Indeterminate +----------+------------+---------+-----------+----------+-----------------+ Basilic       Full                                                    +----------+------------+---------+-----------+----------+-----------------+  Summary:  Left: No evidence of deep vein thrombosis in the upper extremity. Findings consistent with age indeterminate superficial vein thrombosis involving the left cephalic vein.  *See table(s) above for measurements and observations.  Diagnosing physician: Ruta Hinds MD Electronically signed by Ruta Hinds MD on 09/01/2020 at 7:42:40 PM.    Final     (Echo, Carotid, EGD, Colonoscopy, ERCP)    Subjective: Patient offers no complaints. Reports that his daughter will be helping take care of him after discharge.   Discharge Exam: Vitals:   09/10/20 0728 09/10/20 1126  BP: 130/70 137/73  Pulse: 63 66  Resp: 19 17  Temp: 98.2 F (36.8 C) 98.2 F (36.8 C)  SpO2: 98% 98%   Vitals:   09/10/20 0200 09/10/20 0500 09/10/20 0728 09/10/20 1126  BP: 138/74 127/82  130/70 137/73  Pulse: (!) 55  63 66  Resp: 14 (!) 24 19 17   Temp: 98 F (36.7 C)  98.2 F (36.8 C) 98.2 F (36.8 C)  TempSrc: Oral  Oral Oral  SpO2: 98%  98% 98%  Weight:  81.1 kg    Height:        General: Pt is alert, awake, not in acute distress Cardiovascular: RRR, S1/S2 + Respiratory: No cough, normal effort  Abdominal: Soft, NT, ND Extremities: no edema, no cyanosis    The results of significant diagnostics from this hospitalization (including imaging, microbiology, ancillary and laboratory) are listed below for reference.     Microbiology: No results found for this or any previous visit (from the past 240 hour(s)).   Labs: BNP (last 3 results) Recent Labs    05/26/20 0450 08/26/20 1530  BNP 809.3* 161.0*   Basic Metabolic Panel: Recent Labs  Lab 09/06/20 0355 09/07/20 0810 09/08/20 0447 09/09/20 0447 09/10/20 0056 09/11/20 0100  NA 135 130* 133* 135 134* 130*  K 4.3 4.8 4.6 4.4 4.9 5.1  CL 102 97* 99 100 101 99  CO2 27 20* 26 26 24 23   GLUCOSE 84 383* 220* 172* 194* 480*  BUN 36* 43* 53* 61* 70* 80*  CREATININE 1.84* 2.14* 2.25* 2.22* 2.24* 2.33*  CALCIUM 8.6* 8.5* 8.9 9.1 8.9 8.6*  MG 2.6*  --   --   --   --   --   PHOS 3.2  --   --   --  3.1 3.3   Liver Function Tests: Recent Labs  Lab 09/03/20 1638 09/04/20 0612 09/04/20 1618 09/10/20 0056  ALBUMIN 3.0* 2.9* 3.2* 2.7*   No results for input(s): LIPASE, AMYLASE in the last 168 hours. Recent Labs  Lab 09/06/20 0355  AMMONIA 14   CBC: Recent Labs  Lab 09/05/20 0835 09/05/20 1410 09/07/20 0810 09/08/20 0447 09/09/20 0447 09/10/20 0056 09/10/20 1244 09/11/20 0100 09/11/20 0753  WBC 10.0   < > 10.0 11.4* 11.0* 9.9  --  9.7  --   NEUTROABS 7.2  --   --   --   --   --   --   --   --   HGB 8.5*   < > 7.7* 7.2* 7.2* 6.7* 7.3* 6.7* 7.0*  HCT 28.1*   < > 25.9* 24.0* 24.0* 21.5* 23.4* 22.7* 24.0*  MCV 92.7   < > 91.2 93.0 93.4 91.9  --  94.2  --   PLT 216   < > 196 203 196 174  --   144*  --    < > = values in this interval not displayed.   Cardiac Enzymes: No results for input(s): CKTOTAL, CKMB, CKMBINDEX, TROPONINI in the last 168 hours. BNP: Invalid input(s): POCBNP CBG: Recent Labs  Lab 09/09/20 1223 09/09/20 1557 09/09/20 2126 09/10/20 0609 09/10/20 1128  GLUCAP 274* 303* 269* 97 149*   D-Dimer No results for input(s): DDIMER in the last 72 hours. Hgb A1c No results for input(s): HGBA1C in the last 72 hours. Lipid Profile No results for input(s): CHOL, HDL, LDLCALC, TRIG, CHOLHDL, LDLDIRECT in the last 72 hours. Thyroid function studies No results for input(s): TSH, T4TOTAL, T3FREE, THYROIDAB in the last 72 hours.  Invalid input(s): FREET3 Anemia work up No results for input(s): VITAMINB12, FOLATE, FERRITIN, TIBC, IRON, RETICCTPCT in the last 72 hours. Urinalysis    Component Value Date/Time   COLORURINE YELLOW 08/26/2020 [place]  08/26/2020 1533   LABSPEC 1.012 08/26/2020 1533   PHURINE 5.0 08/26/2020 1533   GLUCOSEU 150 (A) 08/26/2020 1533   HGBUR SMALL (A) 08/26/2020 1533   BILIRUBINUR NEGATIVE 08/26/2020 1533   KETONESUR 5 (A) 08/26/2020 1533   PROTEINUR >=300 (A) 08/26/2020 1533   NITRITE NEGATIVE 08/26/2020 1533   LEUKOCYTESUR NEGATIVE 08/26/2020 1533   Sepsis Labs Invalid input(s): PROCALCITONIN,  WBC,  LACTICIDVEN Microbiology No results found for this or any previous visit (from the past 240 hour(s)).   Time coordinating discharge: Over 35 minutes  SIGNED:   Blain Pais, MD  [facility] 09/11/2020, 7:10 PM    [email or web address] Password [email or web address]

## 2020-09-10 NOTE — Progress Notes (Signed)
Ok to start lasix 40 mg daily at time of discharge and will arrange for outpatient follow up in 3 weeks after discharge. Continue with his current doses of immunosuppressive agents.

## 2020-09-10 NOTE — Progress Notes (Signed)
MD made aware of pts hemoglobin of 6.7. MD came bedside and RN discussed option of blood transfusion with pt. Pt. Declines at this time. Repeat CBC possible in am. Will continue to monitor.

## 2020-09-10 NOTE — Progress Notes (Signed)
Patient ID: [name], male   DOB: [birth year], 74 y.o.   MRN: [id number] S: Lethargic this morning.  Reports that his feet hurt last night and was given pain meds.  No N/V/D/SOB. O:BP 130/70 (BP Location: Right Arm)   Pulse 63   Temp 98.2 F (36.8 C) (Oral)   Resp 19   Ht 5\' 8"  (1.727 m)   Wt 81.1 kg   SpO2 98%   BMI 27.19 kg/m   Intake/Output Summary (Last 24 hours) at 09/10/2020 1000 Last data filed at 09/10/2020 0700 Gross per 24 hour  Intake 960 ml  Output 750 ml  Net 210 ml   Intake/Output: I/O last 3 completed shifts: In: 1680 [P.O.:1680] Out: 1750 [KKXFG:1829]  Intake/Output this shift:  No intake/output data recorded. Weight change: -0.4 kg Gen: NAD CVS: RRR Resp: cta Abd: benign Ext: trace ble edema, left wound wrapped  Recent Labs  Lab 09/03/20 1638 09/04/20 0612 09/04/20 1618 09/06/20 0355 09/07/20 0810 09/08/20 0447 09/09/20 0447 09/10/20 0056  NA 137 137  137 136 135 130* 133* 135 134*  K 4.3 5.0  5.0 4.8 4.3 4.8 4.6 4.4 4.9  CL 104 104  104 101 102 97* 99 100 101  CO2 24 22  22 25 27  20* 26 26 24   GLUCOSE 80 304*  301* 186* 84 383* 220* 172* 194*  BUN 58* 36*  38* 35* 36* 43* 53* 61* 70*  CREATININE 1.43* 1.34*  1.34* 1.32* 1.84* 2.14* 2.25* 2.22* 2.24*  ALBUMIN 3.0* 2.9* 3.2*  --   --   --   --  2.7*  CALCIUM 8.7* 8.4*  8.4* 8.6* 8.6* 8.5* 8.9 9.1 8.9  PHOS 2.1* 2.8  2.8 1.8* 3.2  --   --   --  3.1   Liver Function Tests: Recent Labs  Lab 09/04/20 0612 09/04/20 1618 09/10/20 0056  ALBUMIN 2.9* 3.2* 2.7*   No results for input(s): LIPASE, AMYLASE in the last 168 hours. Recent Labs  Lab 09/06/20 0355  AMMONIA 14   CBC: Recent Labs  Lab 09/04/20 0612 09/05/20 0835 09/05/20 1410 09/06/20 0355 09/07/20 0810 09/08/20 0447 09/09/20 0447 09/10/20 0056  WBC 7.5 10.0   < > 10.3 10.0 11.4* 11.0* 9.9  NEUTROABS 6.5 7.2  --   --   --   --   --   --   HGB 8.7* 8.5*   < > 7.7* 7.7* 7.2* 7.2* 6.7*  HCT 29.3* 28.1*   < >  25.8* 25.9* 24.0* 24.0* 21.5*  MCV 94.5 92.7   < > 92.1 91.2 93.0 93.4 91.9  PLT 166 216   < > 188 196 203 196 174   < > = values in this interval not displayed.   Cardiac Enzymes: No results for input(s): CKTOTAL, CKMB, CKMBINDEX, TROPONINI in the last 168 hours. CBG: Recent Labs  Lab 09/09/20 0826 09/09/20 1223 09/09/20 1557 09/09/20 2126 09/10/20 0609  GLUCAP 133* 274* 303* 269* 97    Iron Studies: No results for input(s): IRON, TIBC, TRANSFERRIN, FERRITIN in the last 72 hours. Studies/Results: No results found. . (feeding supplement) PROSource Plus  30 mL Oral BID BM  . allopurinol  100 mg Oral Daily  . aspirin EC  162 mg Oral BID  . atorvastatin  10 mg Oral Daily  . calcitRIOL  0.25 mcg Oral Daily  . carvedilol  6.25 mg Oral BID WC  . cephALEXin  500 mg Oral Q12H  . Chlorhexidine Gluconate Cloth  6 each Topical  Daily  . feeding supplement (NEPRO CARB STEADY)  237 mL Oral QHS  . ferrous sulfate  325 mg Oral Q breakfast  . heparin  5,000 Units Subcutaneous Q8H  . hydrALAZINE  25 mg Oral TID  . insulin aspart  0-5 Units Subcutaneous QHS  . insulin aspart  0-9 Units Subcutaneous TID WC  . insulin aspart  2 Units Subcutaneous TID WC  . insulin detemir  18 Units Subcutaneous Daily  . latanoprost  1 drop Both Eyes QHS  . linagliptin  5 mg Oral Daily  . mouth rinse  15 mL Mouth Rinse BID  . multivitamin  1 tablet Oral QHS  . mycophenolate  500 mg Oral BID  . predniSONE  30 mg Oral Daily  . sodium bicarbonate  650 mg Oral TID  . sodium chloride flush  3 mL Intravenous Q12H  . sodium chloride flush  3 mL Intravenous Q12H  . tacrolimus  1 mg Oral BID  . thiamine  100 mg Oral Daily    BMET    Component Value Date/Time   NA 134 (L) 09/10/2020 0056   NA 143 03/01/2019 0833   K 4.9 09/10/2020 0056   CL 101 09/10/2020 0056   CO2 24 09/10/2020 0056   GLUCOSE 194 (H) 09/10/2020 0056   BUN 70 (H) 09/10/2020 0056   BUN 51 (H) 03/01/2019 0833   CREATININE 2.24 (H)  09/10/2020 0056   CALCIUM 8.9 09/10/2020 0056   CALCIUM 10.0 02/27/2019 1235   GFRNONAA 30 (L) 09/10/2020 0056   GFRAA 47 (L) 06/06/2020 0400   CBC    Component Value Date/Time   WBC 9.9 09/10/2020 0056   RBC 2.34 (L) 09/10/2020 0056   HGB 6.7 (LL) 09/10/2020 0056   HCT 21.5 (L) 09/10/2020 0056   PLT 174 09/10/2020 0056   MCV 91.9 09/10/2020 0056   MCH 28.6 09/10/2020 0056   MCHC 31.2 09/10/2020 0056   RDW 15.1 09/10/2020 0056   LYMPHSABS 1.1 09/05/2020 0835   MONOABS 1.2 (H) 09/05/2020 0835   EOSABS 0.1 09/05/2020 0835   BASOSABS 0.0 09/05/2020 0835      Assessment/Plan:  1. AKI/CKD stage IV s/p DDKT- AKI presumably due to ischemic ATN in setting of sepsis and hemodynamic changes related to hypertensive urgency that was present upon admission followed by profound hypotension (SBP in the 50's). Baseline Scr 2-2.4 but rose to 3.4 and CRRT initiated on 12/15 due to AMS and +/- uremia. CRRT stopped on 09/05/20 and transferred to [facility] for possible IHD, however has had marked increase in UOP. Hopefully he is diuretic phase of recovery and will not require further RRT. Will hold off removing temp HD cath for now and follow UOP and daily Scr. 1. Scr back at his baseline of 2.2-2.4.  2. No indication for dialysis at this time. 3. Right fem HD cath removed 35/00/93 without complications. 4. Will sign off and arrange for outpatient followup in 2-3 weeks 5. Please call with questions or concerns. 2. AMS/delirium- presumably due to infection and AKI. Improving. 3. ESRD s/p DDKT in 2005. Continue with prograf, cellcept, and prednisone 4. HTN/Volume- stable 5. Anemia of CKD stage IV and daily blood draws.  Given aranesp 200 mcg yesterday.  Ok to transfuse 1 unit today (hgb slowly dropping over the last week to 6.7). 6. Sepsis due to left foot cellulitis- resolved with IV ceftriaxone. Per primary. Now on oral keflex. Wound care consulted 7. Disposition- going home with [facility]  [name], MD  [employer] [phone]

## 2020-09-10 NOTE — Progress Notes (Signed)
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  Patient was found to be febrile on arrival to the ED.  Noncontrast CT head negative for acute intracranial abnormalities.  Chest x-ray showed cardiomegaly with mild diffuse interstitial edema.  X-ray of the left foot show soft tissue swelling around the left fifth MTP without acute bony abnormality.  Lactic acid 1.9 with leukocytosis and thrombocytopenia.  Patient was admitted for sepsis secondary to left foot cellulitis.  Orthopedic think patient's left foot pain is secondary to gout.  No osteomyelitis.  Patient was hypertensive on arrival and became hypotensive after getting nitroglycerin drip with a systolic blood pressure running in the 50s.  Blood pressure now stable.  Patient serum creatinine worsened to 3.4 on 12/14.  He was a started on CRRT due to uremic symptoms.  Lab improving but patient continued to be confused.  Nephrology recommend transfer to [facility] for in case he required  intermittent hemodialysis.  HD cath removed 09/09/20 Assessment & Plan:   Principal Problem:   Sepsis due to cellulitis [facility]) Active Problems:   Kidney transplant status, cadaveric   Essential hypertension   Acute encephalopathy   Anemia due to chronic kidney disease   Diabetes mellitus type 2, insulin dependent (HCC)   Chronic kidney disease, stage 3b ([place])   Hypertensive emergency   Anemia, unspecified   Chronic renal failure, stage 3b ([place])   Renal transplant, status post   Diabetes mellitus type 2, controlled,  with complications ([place])   Diabetic nephropathy ([place])   Pressure injury of skin   Malnutrition of moderate degree   Idiopathic chronic gout of multiple sites without tophus   Systolic and diastolic CHF, acute ([place])   1-Sepsis secondary to left foot cellulitis: -Patient presented with fever, tachypnea respiration more than 20 source of infection cellulitis of bilateral lower extremity. -Patient received IV antibiotics, 5 days of ceftriaxone -MRI of the left foot: Prominent erosion first M TP, base of the distal phalanx fourth toe, but no clear evidence of osteomyelitis.  No drainable fluid collection.  Circumferential edema in the forefoot, cellulitis not excluded. -Lactic acid 1.9, procalcitonin 2.1 -Dr. Dwyane Dee discussed with Dr. Sharol Given from orthopedic, patient lower extremity is secondary to acute gout and no surgical intervention is warranted. -Sepsis physiology resolved. -Cellulitis improved, mild  drainage form second toe, Added  Keflex. Plan to complete 2 more  days.  Wound care consulted  -Continue with wound care. Wound looks better.  -WBC stable.  09/10/20 Stable, will continue keflex, wound care.   2-Metabolic encephalopathy; related to sepsis, metabolic.  -CT head negative. -RPR, TSH, vitamin B12 and ammonia normal. -Holding sedative -Patient received CRRT, labs improving but he continued to be confused. -Patient continues to be on soft restraint and belly belt due to high risk for fall and aggression.  -Continue with Haldol as needed -Per nephrology, encephalopathy not  related to uremia. -Continue with Thiamine and folic acid.  -Patient MS is improved. He is alert and conversant. He was able to ambulate today with PT>  09/10/20 Patient with moments of confusion, question possible underlying early dementia.   3-AKI on CKD stage IIIb,  renal transplant x2 (1983, 2005) Related to sepsis/hemodynamic changes.  Nephrology following. Cr baseline 1.2--2 No plan for HD for now.   Received CRRT during this admission.  Continue with Cellcept, prednisone, prograf.  Urine out put improved.  Cr stable at 2.2. Plan to remove temporary  HD catheter today.  Will need to follow nephrologist recommendation for Diuretics for discharge.   09/10/20 Was on po Lasix 80mg  daily at home but is not on diuretic here. On sodium bicarbonate. Will need recommendations for po Lasix by Nephrology at the time of discharge.   Anemia of chronic kidney disease: Hemoglobin baseline 9. Hb down to 7. On oral iron. Will discussed with nephrology about aranesp.   09/10/20 Hgb low this am but trended up. Will repeat Hgb level in am. Patient today refusing transfusion, would like to have epo tx, wants to discuss this with Nephrology.   -Chest pain: Resolved. Hypertensive urgency: Improved On hydralazine, holder parameter to prevent hypotension.  Decrease hydralazine to 25 mg, decrease carvedilol to home dose, to avoid bradycardia.   Acute systolic and diastolic heart failure ejection fraction 40 to 45%. 12/13; Cardiology recommend follow up with Dr Tamala Julian outpatient.   Hypotension: Related to medication resolved  Pleural effusion: Moderate pleural effusion seen on echocardiogram.  Diabetes type 2  with hypoglycemia Hyperglycemia. Discontinue D 10. Resume levemir.  Added meals coverage.  On SSI.  Resume tradjenta.  Continue with levemir.  CBG better controlled.   Gout: Uric acid at 10. Continue with prednisone, started  taper.  Repeat uric acid. Down to 6  Hypomagnesemia: Improved Hyperkalemia: Resolved Bilateral arm pain chronic: Dopplers negative. Moderate malnutrition; continue with supplement  Pressure Injury 08/27/20 Sacrum Posterior;Mid Stage 1 -  Intact skin with non-blanchable redness of a localized area usually over a bony prominence. 2" X 1" small stage 1 sacral PI (Active)  08/27/20 1230  Location: Sacrum  Location Orientation: Posterior;Mid  Staging: Stage 1 -   Intact skin with non-blanchable redness of a localized area usually over a bony prominence.  Wound Description (Comments): 2" X 1" small stage 1 sacral PI  Present on Admission: Yes     Nutrition Problem: Moderate Malnutrition Etiology: chronic illness (DM with neuropathy)    Signs/Symptoms: mild fat depletion,mild muscle depletion,moderate muscle depletion    Interventions: Juven,Prostat,MVI  Estimated body mass index is 27.19 kg/m as calculated from the following:   Height as of this encounter: 5\' 8"  (1.727 m).   Weight as of this encounter: 81.1 kg.   DVT prophylaxis: Heparin Code Status: Full code Family Communication: Discussed with patient. Will update daughter today  Disposition Plan:  Status is: Inpatient  Remains inpatient appropriate because:Unsafe d/c plan, home tomorrow.    Dispo: The patient is from: Home              Anticipated d/c is to: Home               Anticipated d/c date is: 1 day              Patient currently is not medically stable to d/c. home tomorrow 12/23        Consultants:   Nephrology  Neurology  Procedures:   CRRT  Antimicrobials:  None  Subjective: He reports feeling better.  Per nursing, patient with moments of confusion but overall following commands.       Objective: Vitals:   09/10/20 0500 09/10/20 0728 09/10/20 1126 09/10/20 1630  BP: 127/82 130/70 137/73 127/60  Pulse:  63 66 68  Resp: (!) 24 19 17  (!) 21  Temp:  98.2 F (36.8 C) 98.2 F (36.8 C) 98.6 F (37 C)  TempSrc:  Oral Oral Oral  SpO2:  98% 98% 98%  Weight: 81.1 kg     Height:        Intake/Output Summary (Last 24 hours) at 09/10/2020 1800 Last data filed at 09/10/2020 1700 Gross per 24 hour  Intake 960 ml  Output 950 ml  Net 10 ml   Filed Weights   09/08/20 0500 09/09/20 0029 09/10/20 0500  Weight: 81 kg 81.5 kg 81.1 kg    Examination:  General exam: NAD, sitting up in bed.  Respiratory system: No cough, normal effort.   Cardiovascular system: S 1, S 2  RRR Gastrointestinal system:  soft, nt Central nervous system: Alert, following command Extremities: No edema   Data Reviewed: I have personally reviewed following labs  CBC: Recent Labs  Lab 09/04/20 0612 09/05/20 0835 09/05/20 1410 09/06/20 0355 09/07/20 0810 09/08/20 0447 09/09/20 0447 09/10/20 0056 09/10/20 1244  WBC 7.5 10.0   < > 10.3 10.0 11.4* 11.0* 9.9  --   NEUTROABS 6.5 7.2  --   --   --   --   --   --   --   HGB 8.7* 8.5*   < > 7.7* 7.7* 7.2* 7.2* 6.7* 7.3*  HCT 29.3* 28.1*   < > 25.8* 25.9* 24.0* 24.0* 21.5* 23.4*  MCV 94.5 92.7   < > 92.1 91.2 93.0 93.4 91.9  --   PLT 166 216   < > 188 196 203 196 174  --    < > = values in this interval not displayed.   Basic Metabolic Panel: Recent Labs  Lab 09/04/20 0612 09/04/20 1618 09/06/20 0355 09/07/20 0810 09/08/20 0447 09/09/20 0447 09/10/20 0056  NA 137  137 136 135 130* 133* 135 134*  K 5.0  5.0 4.8 4.3 4.8 4.6 4.4 4.9  CL 104  104 101 102 97* 99 100 101  CO2 22  22 25 27  20* 26 26 24   GLUCOSE 304*  301* 186* 84 383* 220* 172* 194*  BUN 36*  38* 35* 36* 43* 53* 61* 70*  CREATININE 1.34*  1.34* 1.32* 1.84* 2.14* 2.25* 2.22* 2.24*  CALCIUM 8.4*  8.4* 8.6* 8.6* 8.5* 8.9 9.1 8.9  MG 2.4  --  2.6*  --   --   --   --   PHOS 2.8  2.8 1.8* 3.2  --   --   --  3.1   GFR: Estimated Creatinine Clearance: 28.4 mL/min (A) (by C-G formula based on SCr of 2.24 mg/dL (H)). Liver Function Tests: Recent Labs  Lab 09/04/20 0612 09/04/20 1618 09/10/20 0056  ALBUMIN 2.9* 3.2* 2.7*   No results for input(s): LIPASE, AMYLASE in the last 168 hours. Recent Labs  Lab 09/06/20 0355  AMMONIA 14   Coagulation Profile: No results for input(s): INR, PROTIME in the last 168 hours. Cardiac Enzymes: No results for input(s): CKTOTAL, CKMB, CKMBINDEX, TROPONINI in the last 168 hours. BNP (last 3 results) No results for input(s): PROBNP in the last 8760 hours. HbA1C: No results  for input(s): HGBA1C in the last 72 hours. CBG: Recent Labs  Lab 09/09/20 1557 09/09/20 2126 09/10/20 0609 09/10/20 1128 09/10/20 1629  GLUCAP 303* 269* 97 149* 326*   Lipid Profile: No results for input(s): CHOL, HDL, LDLCALC, TRIG, CHOLHDL, LDLDIRECT in the last 72 hours. Thyroid Function Tests: No  results for input(s): TSH, T4TOTAL, FREET4, T3FREE, THYROIDAB in the last 72 hours. Anemia Panel: No results for input(s): VITAMINB12, FOLATE, FERRITIN, TIBC, IRON, RETICCTPCT in the last 72 hours. Sepsis Labs: No results for input(s): PROCALCITON, LATICACIDVEN in the last 168 hours.  No results found for this or any previous visit (from the past 240 hour(s)).       Radiology Studies: No results found.      Scheduled Meds: . (feeding supplement) PROSource Plus  30 mL Oral BID BM  . allopurinol  100 mg Oral Daily  . aspirin EC  162 mg Oral BID  . atorvastatin  10 mg Oral Daily  . calcitRIOL  0.25 mcg Oral Daily  . carvedilol  6.25 mg Oral BID WC  . cephALEXin  500 mg Oral Q12H  . Chlorhexidine Gluconate Cloth  6 each Topical Daily  . feeding supplement (NEPRO CARB STEADY)  237 mL Oral QHS  . ferrous sulfate  325 mg Oral Q breakfast  . heparin  5,000 Units Subcutaneous Q8H  . hydrALAZINE  25 mg Oral TID  . insulin aspart  0-5 Units Subcutaneous QHS  . insulin aspart  0-9 Units Subcutaneous TID WC  . insulin aspart  2 Units Subcutaneous TID WC  . insulin detemir  18 Units Subcutaneous Daily  . latanoprost  1 drop Both Eyes QHS  . linagliptin  5 mg Oral Daily  . mouth rinse  15 mL Mouth Rinse BID  . multivitamin  1 tablet Oral QHS  . mycophenolate  500 mg Oral BID  . predniSONE  30 mg Oral Daily  . sodium bicarbonate  650 mg Oral TID  . sodium chloride flush  3 mL Intravenous Q12H  . sodium chloride flush  3 mL Intravenous Q12H  . tacrolimus  1 mg Oral BID  . thiamine  100 mg Oral Daily   Continuous Infusions: . sodium chloride       LOS: 15 days    Time  spent: 35 minutes   Blain Pais, MD [facility]   If 7PM-7AM, please contact night-coverage [email or web address]  09/10/2020, 6:00 PM

## 2020-09-10 NOTE — Plan of Care (Signed)
  Problem: Education: Goal: Knowledge of General Education information will improve Description: Including pain rating scale, medication(s)/side effects and non-pharmacologic comfort measures Outcome: Progressing   Problem: Clinical Measurements: Goal: Respiratory complications will improve Outcome: Progressing   Problem: Activity: Goal: Risk for activity intolerance will decrease Outcome: Progressing   Problem: Nutrition: Goal: Adequate nutrition will be maintained Outcome: Progressing   Problem: Coping: Goal: Level of anxiety will decrease Outcome: Progressing   Problem: Elimination: Goal: Will not experience complications related to bowel motility Outcome: Progressing Goal: Will not experience complications related to urinary retention Outcome: Progressing   Problem: Pain Managment: Goal: General experience of comfort will improve Outcome: Progressing   Problem: Safety: Goal: Ability to remain free from injury will improve Outcome: Progressing   Problem: Clinical Measurements: Goal: Ability to avoid or minimize complications of infection will improve Outcome: Progressing   Problem: Skin Integrity: Goal: Skin integrity will improve Outcome: Progressing

## 2020-09-11 LAB — GLUCOSE, CAPILLARY
Glucose-Capillary: 183 mg/dL — ABNORMAL HIGH (ref 70–99)
Glucose-Capillary: 184 mg/dL — ABNORMAL HIGH (ref 70–99)
Glucose-Capillary: 224 mg/dL — ABNORMAL HIGH (ref 70–99)
Glucose-Capillary: 262 mg/dL — ABNORMAL HIGH (ref 70–99)
Glucose-Capillary: 284 mg/dL — ABNORMAL HIGH (ref 70–99)
Glucose-Capillary: 298 mg/dL — ABNORMAL HIGH (ref 70–99)
Glucose-Capillary: 475 mg/dL — ABNORMAL HIGH (ref 70–99)

## 2020-09-11 LAB — RENAL FUNCTION PANEL
Albumin: 2.7 g/dL — ABNORMAL LOW (ref 3.5–5.0)
Anion gap: 8 (ref 5–15)
BUN: 80 mg/dL — ABNORMAL HIGH (ref 8–23)
CO2: 23 mmol/L (ref 22–32)
Calcium: 8.6 mg/dL — ABNORMAL LOW (ref 8.9–10.3)
Chloride: 99 mmol/L (ref 98–111)
Creatinine, Ser: 2.33 mg/dL — ABNORMAL HIGH (ref 0.61–1.24)
GFR, Estimated: 29 mL/min — ABNORMAL LOW (ref >60)
Glucose, Bld: 480 mg/dL — ABNORMAL HIGH (ref 70–99)
Phosphorus: 3.3 mg/dL (ref 2.5–4.6)
Potassium: 5.1 mmol/L (ref 3.5–5.1)
Sodium: 130 mmol/L — ABNORMAL LOW (ref 135–145)

## 2020-09-11 LAB — CBC
HCT: 22.7 % — ABNORMAL LOW (ref 39.0–52.0)
Hemoglobin: 6.7 g/dL — CL (ref 13.0–17.0)
MCH: 27.8 pg (ref 26.0–34.0)
MCHC: 29.5 g/dL — ABNORMAL LOW (ref 30.0–36.0)
MCV: 94.2 fL (ref 80.0–100.0)
Platelets: 144 10*3/uL — ABNORMAL LOW (ref 150–400)
RBC: 2.41 MIL/uL — ABNORMAL LOW (ref 4.22–5.81)
RDW: 15.3 % (ref 11.5–15.5)
WBC: 9.7 10*3/uL (ref 4.0–10.5)
nRBC: 0 % (ref 0.0–0.2)

## 2020-09-11 LAB — HEMOGLOBIN AND HEMATOCRIT, BLOOD
HCT: 24 % — ABNORMAL LOW (ref 39.0–52.0)
HCT: 24.7 % — ABNORMAL LOW (ref 39.0–52.0)
Hemoglobin: 7 g/dL — ABNORMAL LOW (ref 13.0–17.0)
Hemoglobin: 7.4 g/dL — ABNORMAL LOW (ref 13.0–17.0)

## 2020-09-11 LAB — PREPARE RBC (CROSSMATCH)

## 2020-09-11 MED ORDER — NEPRO/CARBSTEADY PO LIQD: 237.0000 mL | Freq: Every day | ORAL | 30 refills | Status: AC

## 2020-09-11 MED ORDER — INSULIN ASPART 100 UNIT/ML ~~LOC~~ SOLN
6.0000 [IU] | Freq: Once | SUBCUTANEOUS | Status: AC
  Administered 2020-09-11: 01:00:00 6 [IU] via SUBCUTANEOUS

## 2020-09-11 MED ORDER — FUROSEMIDE 40 MG PO TABS: 40.0000 mg | ORAL_TABLET | Freq: Every day | ORAL | Status: AC

## 2020-09-11 MED ORDER — SODIUM CHLORIDE 0.9% IV SOLUTION: Freq: Once | INTRAVENOUS | Status: DC

## 2020-09-11 NOTE — Progress Notes (Signed)
Pt's Hemoglobin=6.7 today. Dr Clearence Ped informed. Blood transfusion ordered. Pt still refused blood transfusion this time & wants to talked to Dr Marval Regal in AM. Dr Clearence Ped made aware. Orders cancelled.

## 2020-09-11 NOTE — Progress Notes (Signed)
Inpatient Diabetes Program Recommendations  AACE/ADA: New Consensus Statement on Inpatient Glycemic Control (2015)  Target Ranges:  Prepandial:   less than 140 mg/dL      Peak postprandial:   less than 180 mg/dL (1-2 hours)      Critically ill patients:  140 - 180 mg/dL   Lab Results  Component Value Date   GLUCAP 183 (H) 09/11/2020   HGBA1C 6.6 (H) 08/27/2020    Review of Glycemic Control Results for [name], [name] (MRN [id number]) as of 09/11/2020 09:54  Ref. Range 09/10/2020 06:09 09/10/2020 11:28 09/10/2020 16:29 09/10/2020 21:27 09/11/2020 00:17 09/11/2020 03:34 09/11/2020 06:09  Glucose-Capillary Latest Ref Range: 70 - 99 mg/dL 97 149 (H) 326 (H) 529 (HH) 475 (H) 224 (H) 183 (H)   Diabetes history:  DM2 Current orders for Inpatient glycemic control:  Tradjenta 5 mg daily Levemir 18 daily Novolog 0-9 units TID & 0-5 units QHS Novolog 2 units TID with meals Prednisone 30 mg daily  Inpatient Diabetes Program Recommendations:     Please consider increasing meal cover as postprandials are elevated: Novolog 6 units TID with meals if eats at least 50% of meal  Will continue to follow while inpatient.  Thank you, Reche Dixon, RN, BSN Diabetes Coordinator Inpatient Diabetes Program [phone] (team pager from 8a-5p)

## 2020-09-11 NOTE — Progress Notes (Signed)
Physical Therapy Treatment Patient Details Name: [name] MRN: [id number] DOB: 05/07/1946 Today's Date: 09/11/2020    History of Present Illness 74-year-old s/p confusion, CThead negative, l 5th MTP soft tissue swelling found and sepsis in L foot, met encepelopathy. PMHx: for renal transplant x2 (1983, 2005), CKD stage IIIb (creatinine baseline 2.08-2.2), chronic systolic and diastolic heart failure ejection fraction 40 to 45%, hypertension, DMT2, with complication of diabetic nephropathy, chronic left foot wound.    PT Comments    Pt supine in bed.  Upon standing noticed with soiled pads due to urinary incontinence.  Pt performed gt training with noticeable fatigue post session.  Continue to recommend HHPT.  Pt continues to make progress toward therapy goals.     Follow Up Recommendations  Home health PT;Supervision/Assistance - 24 hour     Equipment Recommendations  3in1 (PT)    Recommendations for Other Services       Precautions / Restrictions Precautions Precautions: Fall;Other (comment) Restrictions Weight Bearing Restrictions: No    Mobility  Bed Mobility Overal bed mobility: Needs Assistance Bed Mobility: Supine to Sit;Sit to Supine     Supine to sit: Min guard Sit to supine: Min guard   General bed mobility comments: Min guard for line/lead management, blood running.  Transfers Overall transfer level: Needs assistance Equipment used: Rolling walker (2 wheeled) Transfers: Sit to/from Stand Sit to Stand: Min guard;From elevated surface         General transfer comment: Min guard from elevated surface.  Ambulation/Gait Ambulation/Gait assistance: Min guard Gait Distance (Feet): 160 Feet Assistive device: Rolling walker (2 wheeled) Gait Pattern/deviations: Step-through pattern;Decreased stride length;Trunk flexed;Decreased stance time - left Gait velocity: decreased   General Gait Details: Cues for upper trunk control and RW safety.  Pt with c/o  foot pain.  Noticeable fatigue after gt training.  DOE observed.   Stairs             Wheelchair Mobility    Modified Rankin (Stroke Patients Only)       Balance Overall balance assessment: Needs assistance Sitting-balance support: Feet supported;No upper extremity supported Sitting balance-Leahy Scale: Good       Standing balance-Leahy Scale: Poor                              Cognition Arousal/Alertness: Awake/alert Behavior During Therapy: WFL for tasks assessed/performed Overall Cognitive Status: Within Functional Limits for tasks assessed                                        Exercises      General Comments        Pertinent Vitals/Pain Pain Assessment: Faces Faces Pain Scale: Hurts little more Pain Location: B feet Pain Descriptors / Indicators: Sore;Guarding Pain Intervention(s): Monitored during session;Repositioned    Home Living                      Prior Function            PT Goals (current goals can now be found in the care plan section) Acute Rehab PT Goals Patient Stated Goal: go home before Christmas Potential to Achieve Goals: Good Progress towards PT goals: Progressing toward goals    Frequency    Min 3X/week      PT Plan Current plan remains appropriate      Co-evaluation              AM-PAC PT "6 Clicks" Mobility   Outcome Measure  Help needed turning from your back to your side while in a flat bed without using bedrails?: None Help needed moving from lying on your back to sitting on the side of a flat bed without using bedrails?: None Help needed moving to and from a bed to a chair (including a wheelchair)?: A Little Help needed standing up from a chair using your arms (e.g., wheelchair or bedside chair)?: A Little Help needed to walk in hospital room?: A Little Help needed climbing 3-5 steps with a railing? : A Little 6 Click Score: 20    End of Session Equipment Utilized  During Treatment: Gait belt Activity Tolerance: Patient tolerated treatment well Patient left: in bed;with bed alarm set;with call bell/phone within reach Nurse Communication: Mobility status (IV pump beeping air in line.) PT Visit Diagnosis: Muscle weakness (generalized) (M62.81);Difficulty in walking, not elsewhere classified (R26.2);Pain Pain - Right/Left: Left Pain - part of body: Ankle and joints of foot     Time: 1701-1725 PT Time Calculation (min) (ACUTE ONLY): 24 min  Charges:  $Gait Training: 8-22 mins $Therapeutic Activity: 8-22 mins                     [name] , PTA Acute Rehabilitation Services Pager [phone] Office [phone]     [name] 09/11/2020, 5:38 PM

## 2020-09-12 LAB — HEMOGLOBIN AND HEMATOCRIT, BLOOD
HCT: 25 % — ABNORMAL LOW (ref 39.0–52.0)
Hemoglobin: 8 g/dL — ABNORMAL LOW (ref 13.0–17.0)

## 2020-09-12 LAB — TYPE AND SCREEN
ABO/RH(D): O POS
Antibody Screen: NEGATIVE
Unit division: 0

## 2020-09-12 LAB — BPAM RBC
Blood Product Expiration Date: 202201262359
ISSUE DATE / TIME: 202112231446
Unit Type and Rh: 5100

## 2020-09-12 LAB — GLUCOSE, CAPILLARY: Glucose-Capillary: 201 mg/dL — ABNORMAL HIGH (ref 70–99)

## 2020-09-12 NOTE — TOC Transition Note (Signed)
Transition of Care [facility]) - CM/SW Discharge Note   Patient Details  Name: [name] MRN: [id number] Date of Birth: 1946-04-11  [facility] of [facility]) CM/SW Contact:  Sharin Mons, RN Phone Number: 09/12/2020, 8:56 AM   Clinical Narrative:    Patient will DC to: home  Anticipated DC date: 09/12/2020 Family notified: daughter Transport by: car   Per MD patient ready for DC today. RN, patient,and patient's daughter notified of DC. NCM made liaison for [place] aware of d/c plan. Pt without Rx med concerns or affordability issues. Pt without DME needs. Post hospital f/u appointments noted on AVS.    RNCM will sign off for now as intervention is no longer needed. Please consult [place] again if new needs arise.    Final next level of care: Skilled Nursing Facility Barriers to Discharge: No Barriers Identified   Patient Goals and CMS Choice Patient states their goals for this hospitalization and ongoing recovery are:: get better CMS Medicare.gov Compare Post Acute Care list provided to:: Patient Choice offered to / list presented to : Patient  Discharge Placement                       Discharge Plan and Services   Discharge Planning Services: CM Consult Post Acute Care Choice: Resumption of Svcs/PTA Provider            DME Agency: NA       HH Arranged: PT,OT,Social Work [place] Date [place]: 09/09/20 Time [place]: [place] Representative spoke with at [place]: Amy  Social Determinants of Health ([place]) Interventions     Readmission Risk Interventions Readmission Risk Prevention Plan 09/09/2020  Transportation Screening Complete  Medication Review Press photographer) Complete  PCP or Specialist appointment within 3-5 days of discharge Complete  HRI or [place] Complete  SW Recovery Care/Counseling Consult Complete  [place]  Not Applicable  Some recent data might be hidden

## 2020-09-12 NOTE — Progress Notes (Signed)
Pt d/c home with daughter. D/c instructions given. Wound care done. All questions answered.

## 2020-09-12 NOTE — Plan of Care (Signed)
  Problem: Education: Goal: Knowledge of General Education information will improve Description: Including pain rating scale, medication(s)/side effects and non-pharmacologic comfort measures Outcome: Adequate for Discharge   Problem: Health Behavior/Discharge Planning: Goal: Ability to manage health-related needs will improve Outcome: Adequate for Discharge   Problem: Clinical Measurements: Goal: Ability to maintain clinical measurements within normal limits will improve Outcome: Adequate for Discharge Goal: Will remain free from infection Outcome: Adequate for Discharge Goal: Diagnostic test results will improve Outcome: Adequate for Discharge Goal: Respiratory complications will improve Outcome: Adequate for Discharge Goal: Cardiovascular complication will be avoided Outcome: Adequate for Discharge   Problem: Activity: Goal: Risk for activity intolerance will decrease Outcome: Adequate for Discharge   Problem: Nutrition: Goal: Adequate nutrition will be maintained Outcome: Adequate for Discharge   Problem: Coping: Goal: Level of anxiety will decrease Outcome: Adequate for Discharge   Problem: Elimination: Goal: Will not experience complications related to bowel motility Outcome: Adequate for Discharge Goal: Will not experience complications related to urinary retention Outcome: Adequate for Discharge   Problem: Pain Managment: Goal: General experience of comfort will improve Outcome: Adequate for Discharge   Problem: Safety: Goal: Ability to remain free from injury will improve Outcome: Adequate for Discharge   Problem: Skin Integrity: Goal: Risk for impaired skin integrity will decrease Outcome: Adequate for Discharge   Problem: Clinical Measurements: Goal: Ability to avoid or minimize complications of infection will improve Outcome: Adequate for Discharge   Problem: Skin Integrity: Goal: Skin integrity will improve Outcome: Adequate for Discharge   

## 2020-10-01 ENCOUNTER — Emergency Department (HOSPITAL_COMMUNITY): Payer: Medicare Other

## 2020-10-01 ENCOUNTER — Inpatient Hospital Stay (HOSPITAL_COMMUNITY)
Admission: EM | Admit: 2020-10-01 | Discharge: 2020-10-21 | DRG: 207 | Disposition: E | Payer: Medicare Other | Attending: Pulmonary Disease | Admitting: Pulmonary Disease

## 2020-10-01 DIAGNOSIS — E1122 Type 2 diabetes mellitus with diabetic chronic kidney disease: Secondary | ICD-10-CM | POA: Diagnosis present

## 2020-10-01 DIAGNOSIS — Z9689 Presence of other specified functional implants: Secondary | ICD-10-CM | POA: Diagnosis not present

## 2020-10-01 DIAGNOSIS — T797XXA Traumatic subcutaneous emphysema, initial encounter: Secondary | ICD-10-CM | POA: Diagnosis not present

## 2020-10-01 DIAGNOSIS — T8619 Other complication of kidney transplant: Secondary | ICD-10-CM | POA: Diagnosis present

## 2020-10-01 DIAGNOSIS — R6521 Severe sepsis with septic shock: Secondary | ICD-10-CM | POA: Diagnosis present

## 2020-10-01 DIAGNOSIS — W06XXXA Fall from bed, initial encounter: Secondary | ICD-10-CM | POA: Diagnosis present

## 2020-10-01 DIAGNOSIS — I469 Cardiac arrest, cause unspecified: Principal | ICD-10-CM

## 2020-10-01 DIAGNOSIS — K7200 Acute and subacute hepatic failure without coma: Secondary | ICD-10-CM | POA: Diagnosis not present

## 2020-10-01 DIAGNOSIS — I5043 Acute on chronic combined systolic (congestive) and diastolic (congestive) heart failure: Secondary | ICD-10-CM | POA: Diagnosis not present

## 2020-10-01 DIAGNOSIS — Z9911 Dependence on respirator [ventilator] status: Secondary | ICD-10-CM | POA: Diagnosis not present

## 2020-10-01 DIAGNOSIS — Z66 Do not resuscitate: Secondary | ICD-10-CM | POA: Diagnosis not present

## 2020-10-01 DIAGNOSIS — N170 Acute kidney failure with tubular necrosis: Secondary | ICD-10-CM | POA: Diagnosis present

## 2020-10-01 DIAGNOSIS — I130 Hypertensive heart and chronic kidney disease with heart failure and stage 1 through stage 4 chronic kidney disease, or unspecified chronic kidney disease: Secondary | ICD-10-CM | POA: Diagnosis present

## 2020-10-01 DIAGNOSIS — E119 Type 2 diabetes mellitus without complications: Secondary | ICD-10-CM

## 2020-10-01 DIAGNOSIS — J80 Acute respiratory distress syndrome: Secondary | ICD-10-CM | POA: Diagnosis present

## 2020-10-01 DIAGNOSIS — A498 Other bacterial infections of unspecified site: Secondary | ICD-10-CM | POA: Diagnosis not present

## 2020-10-01 DIAGNOSIS — R54 Age-related physical debility: Secondary | ICD-10-CM | POA: Diagnosis present

## 2020-10-01 DIAGNOSIS — R29726 NIHSS score 26: Secondary | ICD-10-CM | POA: Diagnosis not present

## 2020-10-01 DIAGNOSIS — S225XXA Flail chest, initial encounter for closed fracture: Secondary | ICD-10-CM | POA: Diagnosis present

## 2020-10-01 DIAGNOSIS — R579 Shock, unspecified: Secondary | ICD-10-CM

## 2020-10-01 DIAGNOSIS — J9690 Respiratory failure, unspecified, unspecified whether with hypoxia or hypercapnia: Secondary | ICD-10-CM

## 2020-10-01 DIAGNOSIS — N184 Chronic kidney disease, stage 4 (severe): Secondary | ICD-10-CM | POA: Diagnosis present

## 2020-10-01 DIAGNOSIS — R079 Chest pain, unspecified: Secondary | ICD-10-CM

## 2020-10-01 DIAGNOSIS — G928 Other toxic encephalopathy: Secondary | ICD-10-CM | POA: Diagnosis present

## 2020-10-01 DIAGNOSIS — Y830 Surgical operation with transplant of whole organ as the cause of abnormal reaction of the patient, or of later complication, without mention of misadventure at the time of the procedure: Secondary | ICD-10-CM | POA: Diagnosis present

## 2020-10-01 DIAGNOSIS — I4892 Unspecified atrial flutter: Secondary | ICD-10-CM | POA: Diagnosis present

## 2020-10-01 DIAGNOSIS — D696 Thrombocytopenia, unspecified: Secondary | ICD-10-CM | POA: Diagnosis not present

## 2020-10-01 DIAGNOSIS — I5041 Acute combined systolic (congestive) and diastolic (congestive) heart failure: Secondary | ICD-10-CM | POA: Diagnosis present

## 2020-10-01 DIAGNOSIS — J9601 Acute respiratory failure with hypoxia: Principal | ICD-10-CM

## 2020-10-01 DIAGNOSIS — S270XXA Traumatic pneumothorax, initial encounter: Secondary | ICD-10-CM

## 2020-10-01 DIAGNOSIS — E1151 Type 2 diabetes mellitus with diabetic peripheral angiopathy without gangrene: Secondary | ICD-10-CM | POA: Diagnosis present

## 2020-10-01 DIAGNOSIS — K559 Vascular disorder of intestine, unspecified: Secondary | ICD-10-CM | POA: Diagnosis not present

## 2020-10-01 DIAGNOSIS — J151 Pneumonia due to Pseudomonas: Secondary | ICD-10-CM | POA: Diagnosis not present

## 2020-10-01 DIAGNOSIS — A4152 Sepsis due to Pseudomonas: Principal | ICD-10-CM | POA: Diagnosis not present

## 2020-10-01 DIAGNOSIS — E785 Hyperlipidemia, unspecified: Secondary | ICD-10-CM | POA: Diagnosis present

## 2020-10-01 DIAGNOSIS — R0989 Other specified symptoms and signs involving the circulatory and respiratory systems: Secondary | ICD-10-CM | POA: Diagnosis not present

## 2020-10-01 DIAGNOSIS — S0240CA Maxillary fracture, right side, initial encounter for closed fracture: Secondary | ICD-10-CM | POA: Diagnosis present

## 2020-10-01 DIAGNOSIS — J939 Pneumothorax, unspecified: Secondary | ICD-10-CM

## 2020-10-01 DIAGNOSIS — J690 Pneumonitis due to inhalation of food and vomit: Secondary | ICD-10-CM | POA: Diagnosis not present

## 2020-10-01 DIAGNOSIS — J869 Pyothorax without fistula: Secondary | ICD-10-CM | POA: Diagnosis not present

## 2020-10-01 DIAGNOSIS — Z20822 Contact with and (suspected) exposure to covid-19: Secondary | ICD-10-CM | POA: Diagnosis not present

## 2020-10-01 DIAGNOSIS — I6340 Cerebral infarction due to embolism of unspecified cerebral artery: Secondary | ICD-10-CM | POA: Diagnosis not present

## 2020-10-01 DIAGNOSIS — J189 Pneumonia, unspecified organism: Secondary | ICD-10-CM | POA: Diagnosis present

## 2020-10-01 DIAGNOSIS — R6889 Other general symptoms and signs: Secondary | ICD-10-CM

## 2020-10-01 DIAGNOSIS — G931 Anoxic brain damage, not elsewhere classified: Secondary | ICD-10-CM | POA: Diagnosis present

## 2020-10-01 DIAGNOSIS — E2740 Unspecified adrenocortical insufficiency: Secondary | ICD-10-CM | POA: Diagnosis not present

## 2020-10-01 DIAGNOSIS — S40811A Abrasion of right upper arm, initial encounter: Secondary | ICD-10-CM | POA: Diagnosis present

## 2020-10-01 DIAGNOSIS — Z515 Encounter for palliative care: Secondary | ICD-10-CM | POA: Diagnosis not present

## 2020-10-01 DIAGNOSIS — R55 Syncope and collapse: Secondary | ICD-10-CM

## 2020-10-01 DIAGNOSIS — T1490XA Injury, unspecified, initial encounter: Secondary | ICD-10-CM

## 2020-10-01 DIAGNOSIS — Z794 Long term (current) use of insulin: Secondary | ICD-10-CM

## 2020-10-01 DIAGNOSIS — J9311 Primary spontaneous pneumothorax: Secondary | ICD-10-CM | POA: Diagnosis not present

## 2020-10-01 DIAGNOSIS — Z7189 Other specified counseling: Secondary | ICD-10-CM | POA: Diagnosis not present

## 2020-10-01 DIAGNOSIS — R471 Dysarthria and anarthria: Secondary | ICD-10-CM | POA: Diagnosis not present

## 2020-10-01 DIAGNOSIS — S51801A Unspecified open wound of right forearm, initial encounter: Secondary | ICD-10-CM | POA: Diagnosis present

## 2020-10-01 DIAGNOSIS — N189 Chronic kidney disease, unspecified: Secondary | ICD-10-CM | POA: Diagnosis not present

## 2020-10-01 DIAGNOSIS — D631 Anemia in chronic kidney disease: Secondary | ICD-10-CM | POA: Diagnosis present

## 2020-10-01 DIAGNOSIS — E11649 Type 2 diabetes mellitus with hypoglycemia without coma: Secondary | ICD-10-CM | POA: Diagnosis present

## 2020-10-01 DIAGNOSIS — Z7952 Long term (current) use of systemic steroids: Secondary | ICD-10-CM

## 2020-10-01 DIAGNOSIS — S270XXD Traumatic pneumothorax, subsequent encounter: Secondary | ICD-10-CM | POA: Diagnosis not present

## 2020-10-01 DIAGNOSIS — Z940 Kidney transplant status: Secondary | ICD-10-CM

## 2020-10-01 DIAGNOSIS — I639 Cerebral infarction, unspecified: Secondary | ICD-10-CM | POA: Diagnosis not present

## 2020-10-01 DIAGNOSIS — G40401 Other generalized epilepsy and epileptic syndromes, not intractable, with status epilepticus: Secondary | ICD-10-CM | POA: Diagnosis not present

## 2020-10-01 DIAGNOSIS — I480 Paroxysmal atrial fibrillation: Secondary | ICD-10-CM | POA: Diagnosis present

## 2020-10-01 DIAGNOSIS — R609 Edema, unspecified: Secondary | ICD-10-CM

## 2020-10-01 DIAGNOSIS — R9401 Abnormal electroencephalogram [EEG]: Secondary | ICD-10-CM

## 2020-10-01 DIAGNOSIS — I878 Other specified disorders of veins: Secondary | ICD-10-CM | POA: Diagnosis present

## 2020-10-01 DIAGNOSIS — Z79899 Other long term (current) drug therapy: Secondary | ICD-10-CM

## 2020-10-01 DIAGNOSIS — Z7984 Long term (current) use of oral hypoglycemic drugs: Secondary | ICD-10-CM

## 2020-10-01 DIAGNOSIS — Y92013 Bedroom of single-family (private) house as the place of occurrence of the external cause: Secondary | ICD-10-CM

## 2020-10-01 DIAGNOSIS — Z888 Allergy status to other drugs, medicaments and biological substances status: Secondary | ICD-10-CM

## 2020-10-01 DIAGNOSIS — R4182 Altered mental status, unspecified: Secondary | ICD-10-CM

## 2020-10-01 DIAGNOSIS — D849 Immunodeficiency, unspecified: Secondary | ICD-10-CM | POA: Diagnosis present

## 2020-10-01 DIAGNOSIS — R34 Anuria and oliguria: Secondary | ICD-10-CM | POA: Diagnosis not present

## 2020-10-01 DIAGNOSIS — E875 Hyperkalemia: Secondary | ICD-10-CM | POA: Diagnosis not present

## 2020-10-01 DIAGNOSIS — Z452 Encounter for adjustment and management of vascular access device: Secondary | ICD-10-CM

## 2020-10-01 DIAGNOSIS — S0181XA Laceration without foreign body of other part of head, initial encounter: Secondary | ICD-10-CM | POA: Diagnosis present

## 2020-10-01 DIAGNOSIS — I5032 Chronic diastolic (congestive) heart failure: Secondary | ICD-10-CM | POA: Diagnosis present

## 2020-10-01 DIAGNOSIS — G9340 Encephalopathy, unspecified: Secondary | ICD-10-CM | POA: Diagnosis present

## 2020-10-01 DIAGNOSIS — S40812A Abrasion of left upper arm, initial encounter: Secondary | ICD-10-CM | POA: Diagnosis present

## 2020-10-01 DIAGNOSIS — I742 Embolism and thrombosis of arteries of the upper extremities: Secondary | ICD-10-CM | POA: Diagnosis not present

## 2020-10-01 LAB — CBC
HCT: 29.6 % — ABNORMAL LOW (ref 39.0–52.0)
HCT: 30.3 % — ABNORMAL LOW (ref 39.0–52.0)
Hemoglobin: 8.7 g/dL — ABNORMAL LOW (ref 13.0–17.0)
Hemoglobin: 8.9 g/dL — ABNORMAL LOW (ref 13.0–17.0)
MCH: 29.8 pg (ref 26.0–34.0)
MCH: 28.9 pg (ref 26.0–34.0)
MCHC: 29.4 g/dL — ABNORMAL LOW (ref 30.0–36.0)
MCHC: 29.4 g/dL — ABNORMAL LOW (ref 30.0–36.0)
MCV: 101.4 fL — ABNORMAL HIGH (ref 80.0–100.0)
MCV: 98.4 fL (ref 80.0–100.0)
Platelets: 139 10*3/uL — ABNORMAL LOW (ref 150–400)
Platelets: 139 10*3/uL — ABNORMAL LOW (ref 150–400)
RBC: 2.92 MIL/uL — ABNORMAL LOW (ref 4.22–5.81)
RBC: 3.08 MIL/uL — ABNORMAL LOW (ref 4.22–5.81)
RDW: 16.2 % — ABNORMAL HIGH (ref 11.5–15.5)
RDW: 16.2 % — ABNORMAL HIGH (ref 11.5–15.5)
WBC: 7.8 10*3/uL (ref 4.0–10.5)
WBC: 7.2 10*3/uL (ref 4.0–10.5)
nRBC: 0 % (ref 0.0–0.2)
nRBC: 0 % (ref 0.0–0.2)

## 2020-10-01 LAB — RESP PANEL BY RT-PCR (RSV, FLU A&B, COVID)  RVPGX2
Influenza A by PCR: NEGATIVE
Influenza B by PCR: NEGATIVE
Resp Syncytial Virus by PCR: NEGATIVE
SARS Coronavirus 2 by RT PCR: NEGATIVE

## 2020-10-01 LAB — URINALYSIS, ROUTINE W REFLEX MICROSCOPIC
Bacteria, UA: NONE SEEN
Bilirubin Urine: NEGATIVE
Glucose, UA: NEGATIVE mg/dL
Hgb urine dipstick: NEGATIVE
Ketones, ur: NEGATIVE mg/dL
Leukocytes,Ua: NEGATIVE
Nitrite: NEGATIVE
Protein, ur: 100 mg/dL — AB
Specific Gravity, Urine: 1.015 (ref 1.005–1.030)
pH: 5 (ref 5.0–8.0)

## 2020-10-01 LAB — COMPREHENSIVE METABOLIC PANEL
ALT: 22 U/L (ref 0–44)
AST: 50 U/L — ABNORMAL HIGH (ref 15–41)
Albumin: 2.9 g/dL — ABNORMAL LOW (ref 3.5–5.0)
Alkaline Phosphatase: 93 U/L (ref 38–126)
Anion gap: 11 (ref 5–15)
BUN: 40 mg/dL — ABNORMAL HIGH (ref 8–23)
CO2: 22 mmol/L (ref 22–32)
Calcium: 9.9 mg/dL (ref 8.9–10.3)
Chloride: 114 mmol/L — ABNORMAL HIGH (ref 98–111)
Creatinine, Ser: 2.27 mg/dL — ABNORMAL HIGH (ref 0.61–1.24)
GFR, Estimated: 30 mL/min — ABNORMAL LOW (ref >60)
Glucose, Bld: 94 mg/dL (ref 70–99)
Potassium: 5 mmol/L (ref 3.5–5.1)
Sodium: 147 mmol/L — ABNORMAL HIGH (ref 135–145)
Total Bilirubin: 0.6 mg/dL (ref 0.3–1.2)
Total Protein: 5.3 g/dL — ABNORMAL LOW (ref 6.5–8.1)

## 2020-10-01 LAB — I-STAT ARTERIAL BLOOD GAS, ED
Acid-base deficit: 2 mmol/L (ref 0.0–2.0)
Bicarbonate: 24.2 mmol/L (ref 20.0–28.0)
Calcium, Ion: 1.44 mmol/L — ABNORMAL HIGH (ref 1.15–1.40)
HCT: 25 % — ABNORMAL LOW (ref 39.0–52.0)
Hemoglobin: 8.5 g/dL — ABNORMAL LOW (ref 13.0–17.0)
O2 Saturation: 95 %
Potassium: 5.3 mmol/L — ABNORMAL HIGH (ref 3.5–5.1)
Sodium: 146 mmol/L — ABNORMAL HIGH (ref 135–145)
TCO2: 26 mmol/L (ref 22–32)
pCO2 arterial: 49.4 mmHg — ABNORMAL HIGH (ref 32.0–48.0)
pH, Arterial: 7.298 — ABNORMAL LOW (ref 7.350–7.450)
pO2, Arterial: 85 mmHg (ref 83.0–108.0)

## 2020-10-01 LAB — GLUCOSE, CAPILLARY
Glucose-Capillary: 44 mg/dL — CL (ref 70–99)
Glucose-Capillary: 79 mg/dL (ref 70–99)
Glucose-Capillary: 56 mg/dL — ABNORMAL LOW (ref 70–99)
Glucose-Capillary: 126 mg/dL — ABNORMAL HIGH (ref 70–99)
Glucose-Capillary: 147 mg/dL — ABNORMAL HIGH (ref 70–99)

## 2020-10-01 LAB — TROPONIN I (HIGH SENSITIVITY)
Troponin I (High Sensitivity): 114 ng/L (ref <18)
Troponin I (High Sensitivity): 1098 ng/L (ref <18)

## 2020-10-01 LAB — I-STAT CHEM 8, ED
BUN: 41 mg/dL — ABNORMAL HIGH (ref 8–23)
Calcium, Ion: 1.45 mmol/L — ABNORMAL HIGH (ref 1.15–1.40)
Chloride: 114 mmol/L — ABNORMAL HIGH (ref 98–111)
Creatinine, Ser: 2.2 mg/dL — ABNORMAL HIGH (ref 0.61–1.24)
Glucose, Bld: 84 mg/dL (ref 70–99)
HCT: 27 % — ABNORMAL LOW (ref 39.0–52.0)
Hemoglobin: 9.2 g/dL — ABNORMAL LOW (ref 13.0–17.0)
Potassium: 5 mmol/L (ref 3.5–5.1)
Sodium: 147 mmol/L — ABNORMAL HIGH (ref 135–145)
TCO2: 23 mmol/L (ref 22–32)

## 2020-10-01 LAB — SAMPLE TO BLOOD BANK

## 2020-10-01 LAB — LACTIC ACID, PLASMA: Lactic Acid, Venous: 2.1 mmol/L (ref 0.5–1.9)

## 2020-10-01 LAB — PROTIME-INR
INR: 1.2 (ref 0.8–1.2)
Prothrombin Time: 14.8 seconds (ref 11.4–15.2)

## 2020-10-01 LAB — ETHANOL: Alcohol, Ethyl (B): <10 mg/dL (ref <10)

## 2020-10-01 LAB — BRAIN NATRIURETIC PEPTIDE: B Natriuretic Peptide: 325.7 pg/mL — ABNORMAL HIGH (ref 0.0–100.0)

## 2020-10-01 LAB — MRSA PCR SCREENING: MRSA by PCR: NEGATIVE

## 2020-10-01 MED ORDER — FENTANYL BOLUS VIA INFUSION
25.0000 ug | INTRAVENOUS | Status: DC | PRN
  Administered 2020-10-02: 75 ug via INTRAVENOUS
  Administered 2020-10-03 – 2020-10-12 (×10): 25 ug via INTRAVENOUS
  Filled 2020-10-01: qty 25

## 2020-10-01 MED ORDER — DEXTROSE 50 % IV SOLN
INTRAVENOUS | Status: AC
  Filled 2020-10-01: qty 50

## 2020-10-01 MED ORDER — HEPARIN SODIUM (PORCINE) 5000 UNIT/ML IJ SOLN
5000.0000 [IU] | Freq: Three times a day (TID) | INTRAMUSCULAR | Status: DC
  Administered 2020-10-01 – 2020-10-09 (×23): 5000 [IU] via SUBCUTANEOUS
  Filled 2020-10-01 (×22): qty 1

## 2020-10-01 MED ORDER — SODIUM CHLORIDE 0.9% FLUSH
10.0000 mL | INTRAVENOUS | Status: DC | PRN
  Administered 2020-10-07: 10 mL

## 2020-10-01 MED ORDER — ACETAMINOPHEN 325 MG PO TABS
650.0000 mg | ORAL_TABLET | Freq: Four times a day (QID) | ORAL | Status: DC | PRN
  Administered 2020-10-01: 650 mg via ORAL

## 2020-10-01 MED ORDER — DOCUSATE SODIUM 100 MG PO CAPS: 100.0000 mg | ORAL_CAPSULE | Freq: Two times a day (BID) | ORAL | Status: DC | PRN

## 2020-10-01 MED ORDER — SUCCINYLCHOLINE CHLORIDE 20 MG/ML IJ SOLN
INTRAMUSCULAR | Status: AC | PRN
  Administered 2020-10-01: 100 mg via INTRAVENOUS

## 2020-10-01 MED ORDER — PROPOFOL 1000 MG/100ML IV EMUL
INTRAVENOUS | Status: AC
  Filled 2020-10-01: qty 100

## 2020-10-01 MED ORDER — ETOMIDATE 2 MG/ML IV SOLN
INTRAVENOUS | Status: AC | PRN
  Administered 2020-10-01: 20 mg via INTRAVENOUS

## 2020-10-01 MED ORDER — ORAL CARE MOUTH RINSE
15.0000 mL | OROMUCOSAL | Status: DC
  Administered 2020-10-01 – 2020-10-14 (×127): 15 mL via OROMUCOSAL

## 2020-10-01 MED ORDER — FAMOTIDINE 20 MG PO TABS
20.0000 mg | ORAL_TABLET | Freq: Two times a day (BID) | ORAL | Status: DC
  Administered 2020-10-01 – 2020-10-04 (×6): 20 mg
  Filled 2020-10-01 (×6): qty 1

## 2020-10-01 MED ORDER — CHLORHEXIDINE GLUCONATE CLOTH 2 % EX PADS
6.0000 | MEDICATED_PAD | Freq: Every day | CUTANEOUS | Status: DC
  Administered 2020-10-01 – 2020-10-14 (×14): 6 via TOPICAL

## 2020-10-01 MED ORDER — CHLORHEXIDINE GLUCONATE 0.12% ORAL RINSE (MEDLINE KIT)
15.0000 mL | Freq: Two times a day (BID) | OROMUCOSAL | Status: DC
  Administered 2020-10-01 – 2020-10-14 (×26): 15 mL via OROMUCOSAL

## 2020-10-01 MED ORDER — SODIUM CHLORIDE 0.9% FLUSH
10.0000 mL | Freq: Two times a day (BID) | INTRAVENOUS | Status: DC
  Administered 2020-10-01 – 2020-10-14 (×21): 10 mL

## 2020-10-01 MED ORDER — MYCOPHENOLATE MOFETIL 250 MG PO CAPS: 500.0000 mg | ORAL_CAPSULE | Freq: Two times a day (BID) | ORAL | Status: DC

## 2020-10-01 MED ORDER — MYCOPHENOLATE 200 MG/ML ORAL SUSPENSION
500.0000 mg | Freq: Two times a day (BID) | ORAL | Status: DC
  Administered 2020-10-01 – 2020-10-11 (×21): 500 mg
  Filled 2020-10-01 (×22): qty 10

## 2020-10-01 MED ORDER — LEVETIRACETAM IN NACL 1000 MG/100ML IV SOLN
1000.0000 mg | Freq: Once | INTRAVENOUS | Status: AC
  Administered 2020-10-01: 1000 mg via INTRAVENOUS
  Filled 2020-10-01: qty 100

## 2020-10-01 MED ORDER — FUROSEMIDE 10 MG/ML IJ SOLN
80.0000 mg | Freq: Once | INTRAMUSCULAR | Status: AC
  Administered 2020-10-01: 80 mg via INTRAVENOUS
  Filled 2020-10-01: qty 8

## 2020-10-01 MED ORDER — ETOMIDATE 2 MG/ML IV SOLN
INTRAVENOUS | Status: AC
  Filled 2020-10-01: qty 10

## 2020-10-01 MED ORDER — TACROLIMUS 1 MG/ML ORAL SUSPENSION
1.0000 mg | Freq: Two times a day (BID) | ORAL | Status: DC
  Administered 2020-10-01 – 2020-10-05 (×8): 1 mg
  Filled 2020-10-01 (×9): qty 1

## 2020-10-01 MED ORDER — SODIUM CHLORIDE 3 % IV BOLUS
250.0000 mL | Freq: Once | INTRAVENOUS | Status: DC
  Filled 2020-10-01: qty 250

## 2020-10-01 MED ORDER — FENTANYL 2500MCG IN NS 250ML (10MCG/ML) PREMIX INFUSION
25.0000 ug/h | INTRAVENOUS | Status: DC
  Administered 2020-10-01: 100 ug/h via INTRAVENOUS
  Administered 2020-10-02: 150 ug/h via INTRAVENOUS
  Administered 2020-10-03: 75 ug/h via INTRAVENOUS
  Administered 2020-10-05 – 2020-10-08 (×2): 50 ug/h via INTRAVENOUS
  Administered 2020-10-10: 75 ug/h via INTRAVENOUS
  Administered 2020-10-12 – 2020-10-13 (×2): 50 ug/h via INTRAVENOUS
  Filled 2020-10-01 (×9): qty 250

## 2020-10-01 MED ORDER — FENTANYL CITRATE (PF) 100 MCG/2ML IJ SOLN
INTRAMUSCULAR | Status: AC | PRN
  Administered 2020-10-01: 100 ug via INTRAVENOUS

## 2020-10-01 MED ORDER — PREDNISOLONE 5 MG PO TABS: 5.0000 mg | ORAL_TABLET | Freq: Every day | ORAL | Status: DC

## 2020-10-01 MED ORDER — IOHEXOL 350 MG/ML SOLN
100.0000 mL | Freq: Once | INTRAVENOUS | Status: AC | PRN
  Administered 2020-10-01: 100 mL via INTRAVENOUS

## 2020-10-01 MED ORDER — SUCCINYLCHOLINE CHLORIDE 200 MG/10ML IV SOSY
PREFILLED_SYRINGE | INTRAVENOUS | Status: AC
  Filled 2020-10-01: qty 10

## 2020-10-01 MED ORDER — FENTANYL CITRATE (PF) 100 MCG/2ML IJ SOLN
25.0000 ug | Freq: Once | INTRAMUSCULAR | Status: DC
  Filled 2020-10-01: qty 2

## 2020-10-01 MED ORDER — PREDNISOLONE 5 MG PO TABS
5.0000 mg | ORAL_TABLET | Freq: Every day | ORAL | Status: DC
  Administered 2020-10-01 – 2020-10-02 (×2): 5 mg
  Filled 2020-10-01 (×2): qty 1

## 2020-10-01 MED ORDER — DEXTROSE 10 % IV SOLN: INTRAVENOUS | Status: AC

## 2020-10-01 MED ORDER — CEFAZOLIN SODIUM-DEXTROSE 2-4 GM/100ML-% IV SOLN
2.0000 g | Freq: Once | INTRAVENOUS | Status: DC
  Filled 2020-10-01: qty 100

## 2020-10-01 MED ORDER — TACROLIMUS 1 MG PO CAPS: 1.0000 mg | ORAL_CAPSULE | Freq: Two times a day (BID) | ORAL | Status: DC

## 2020-10-01 MED ORDER — DOCUSATE SODIUM 50 MG/5ML PO LIQD
100.0000 mg | Freq: Two times a day (BID) | ORAL | Status: DC | PRN
  Administered 2020-10-04: 100 mg
  Filled 2020-10-01 (×2): qty 10

## 2020-10-01 MED ORDER — FENTANYL CITRATE (PF) 100 MCG/2ML IJ SOLN
INTRAMUSCULAR | Status: AC
  Filled 2020-10-01: qty 2

## 2020-10-01 MED ORDER — POLYETHYLENE GLYCOL 3350 17 G PO PACK
17.0000 g | PACK | Freq: Every day | ORAL | Status: DC | PRN
  Administered 2020-10-06: 17 g
  Filled 2020-10-01: qty 1

## 2020-10-01 MED ORDER — DEXTROSE 50 % IV SOLN
INTRAVENOUS | Status: AC
  Administered 2020-10-01: 50 mL
  Filled 2020-10-01: qty 50

## 2020-10-01 MED ORDER — ONDANSETRON HCL 4 MG/2ML IJ SOLN: 4.0000 mg | Freq: Four times a day (QID) | INTRAMUSCULAR | Status: DC | PRN

## 2020-10-01 NOTE — ED Notes (Signed)
Wife - [phone] [name]

## 2020-10-01 NOTE — Consult Note (Signed)
[place] Nurse wound consult note Consultation was completed by review of records, images and assistance from the bedside nurse/clinical staff.  Reason for Consult: arm wound Wound type: trauma; partial thickness degloving injur Pressure Injury POA: NA Measurement: see trauma notes Wound bed:100% clean; skin flap not approximate  Drainage (amount, consistency, odor) see trauma notes  discussed with trauma PA; orders updated per her recommendations and our discussion.   CCS will follow; WOC will not consult further  [place], [place], [place]

## 2020-10-01 NOTE — ED Provider Notes (Signed)
[facility] Provider Note   CSN: [id number] Arrival date & time: 10/13/2020  1044     History Chief Complaint  Patient presents with  . post CPR  . Level 1 trauma head injury    [name] is a 75 y.o. male.  Patient brought in by EMS status postcardiac arrest.  Patient's wife stated that at 2 in the morning he was complaining of chest pain.  Later she noticed that he was having breathing difficulty with some frothy breathing.  She went to call for help and when she came back in he had fallen onto the floor.  And had cut his left forehead area and had a lot of swelling around his eye and was bleeding.  When EMS arrived he was not breathing deemed to have no pulse and CPR was started.  King airway was placed and he received 2 mg of epinephrine total.  Then got spontaneous return of circulation but never really had any movement.  We did get word from the family that patient is a kidney transplant patient.  And arrived without any chest compressions.  King airway in place.  Cervical collar in place.  Shortly after arrival [facility] airway was changed out.  Level 1 trauma call was placed because of the head trauma and the altered mental status not knowing what order of events really resulted in him not waking up.  Patient seen by trauma service.  And then following intubation eventually seen by critical care service.        No past medical history on file.  Patient Active Problem List   Diagnosis Date Noted  . Cardiac arrest ([place]) 10/13/2020         No family history on file.     Home Medications Prior to Admission medications   Not on File    Allergies    Patient has no allergy information on record.  Review of Systems   Review of Systems  Unable to perform ROS: Patient unresponsive    Physical Exam Updated Vital Signs BP 123/82   Pulse 82   Temp 98.3 F (36.8 C) (Axillary)   Resp 18   Ht 1.803 m (5\' 11" )   Wt 86.2 kg    SpO2 100%   BMI 26.50 kg/m   Physical Exam Vitals and nursing note reviewed.  Constitutional:      Appearance: Normal appearance. He is well-developed and well-nourished.  HENT:     Head: Normocephalic and atraumatic.     Comments: Lots of hematoma around the left eye.  Laceration on the forehead down through the eyebrow and just about a few millimeters into the upper eyelid.  That laceration measuring about 5 cm.  Linear nature. Eyes:     Comments: Left pupil is dilated.  Not reactive.  Right pupil is about 4 mm  Cardiovascular:     Rate and Rhythm: Normal rate and regular rhythm.     Heart sounds: No murmur heard.   Pulmonary:     Effort: Pulmonary effort is normal. No respiratory distress.     Breath sounds: Normal breath sounds.  Abdominal:     General: There is distension.     Palpations: Abdomen is soft.     Comments: Abdomen distended.  But soft  Musculoskeletal:        General: No edema.     Cervical back: Normal range of motion and neck supple.     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  Patient with skin tears to both wrist area  Skin:    General: Skin is warm and dry.     Capillary Refill: Capillary refill takes less than 2 seconds.  Neurological:     Mental Status: He is alert.     Comments: Patient really without any spontaneous movement of extremities.  Was breathing somewhat on his own.  Psychiatric:        Mood and Affect: Mood and affect normal.     ED Results / Procedures / Treatments   Labs (all labs ordered are listed, but only abnormal results are displayed) Labs Reviewed  COMPREHENSIVE METABOLIC PANEL - Abnormal; Notable for the following components:      Result Value   Sodium 147 (*)    Chloride 114 (*)    BUN 40 (*)    Creatinine, Ser 2.27 (*)    Total Protein 5.3 (*)    Albumin 2.9 (*)    AST 50 (*)    GFR, Estimated 30 (*)    All other components within normal limits  CBC - Abnormal; Notable for the  following components:   RBC 2.92 (*)    Hemoglobin 8.7 (*)    HCT 29.6 (*)    MCV 101.4 (*)    MCHC 29.4 (*)    RDW 16.2 (*)    Platelets 139 (*)    All other components within normal limits  LACTIC ACID, PLASMA - Abnormal; Notable for the following components:   Lactic Acid, Venous 2.1 (*)    All other components within normal limits  BRAIN NATRIURETIC PEPTIDE - Abnormal; Notable for the following components:   B Natriuretic Peptide 325.7 (*)    All other components within normal limits  GLUCOSE, CAPILLARY - Abnormal; Notable for the following components:   Glucose-Capillary 44 (*)    All other components within normal limits  I-STAT CHEM 8, ED - Abnormal; Notable for the following components:   Sodium 147 (*)    Chloride 114 (*)    BUN 41 (*)    Creatinine, Ser 2.20 (*)    Calcium, Ion 1.45 (*)    Hemoglobin 9.2 (*)    HCT 27.0 (*)    All other components within normal limits  I-STAT ARTERIAL BLOOD GAS, ED - Abnormal; Notable for the following components:   pH, Arterial 7.298 (*)    pCO2 arterial 49.4 (*)    Sodium 146 (*)    Potassium 5.3 (*)    Calcium, Ion 1.44 (*)    HCT 25.0 (*)    Hemoglobin 8.5 (*)    All other components within normal limits  TROPONIN I (HIGH SENSITIVITY) - Abnormal; Notable for the following components:   Troponin I (High Sensitivity) 114 (*)    All other components within normal limits  RESP PANEL BY RT-PCR (RSV, FLU A&B, COVID)  RVPGX2  PROTIME-INR  GLUCOSE, CAPILLARY  ETHANOL  URINALYSIS, ROUTINE W REFLEX MICROSCOPIC  BLOOD GAS, ARTERIAL  CBC  BASIC METABOLIC PANEL  MAGNESIUM  CBC  SAMPLE TO BLOOD BANK  TROPONIN I (HIGH SENSITIVITY)    EKG None  Radiology EEG  Result Date: 10/12/2020 Lora Havens, MD     10/10/2020  1:08 PM Patient Name: [name] MRN: [id number] Epilepsy Attending: Lora Havens Referring Physician/Provider: Dr Reather Laurence Date:  09/25/2020 Duration: 23.42 mins Patient history: 75 year old male  status post cardiac arrest.  EEG to evaluate for seizures. Level of alertness: Comatose AEDs during  EEG study: None Technical aspects: This EEG study was done with scalp electrodes positioned according to the 10-20 International system of electrode placement. Electrical activity was acquired at a sampling rate of 500Hz  and reviewed with a high frequency filter of 70Hz  and a low frequency filter of 1Hz . EEG data were recorded continuously and digitally stored. Description: EEG showed currently generalized background suppression EEG was not reactive due to calcium lesion.  Hyperventilation and photic stimulation were not performed.   Of note, EEG was difficult to interpret due to significant myogenic artifact. ABNORMALITY -Background suppression, generalized IMPRESSION: This technically difficult study is suggestive of profound diffuse encephalopathy, nonspecific etiology.  No seizures or epileptiform discharges were seen throughout the recording. Lora Havens   CT HEAD WO CONTRAST  Result Date: 10/12/2020 CLINICAL DATA:  75 year old male with chest pain beginning at 0200 hours, found unresponsive at 0900 hours. Status post CPR. EXAM: CT HEAD WITHOUT CONTRAST TECHNIQUE: Contiguous axial images were obtained from the base of the skull through the vertex without intravenous contrast. COMPARISON:  Head CT 08/26/2020 and earlier. Face and cervical spine CT today reported separately. FINDINGS: Brain: Stable cerebral volume. No midline shift, ventriculomegaly, mass effect, evidence of mass lesion, intracranial hemorrhage or evidence of cortically based acute infarction. Stable gray-white matter differentiation throughout the brain. Mild for age chronic white matter hypodensity appears stable. Vascular: Advanced chronic calcified atherosclerosis both at the skull base and in the scalp. Skull: Stable.  No skull fracture identified. Sinuses/Orbits: Visualized paranasal sinuses and mastoids are stable and well  pneumatized. Other: Broad-based and 13 mm thick scalp hematoma at the left forehead, which also tracks across the superficial left orbit. The left globe and bilateral intraorbital soft tissues appears stable, within normal limits. No underlying left frontal bone fracture identified. See also face CT reported separately. IMPRESSION: 1. No acute intracranial abnormality. Stable non contrast CT appearance of the brain. 2. Left forehead and periorbital scalp hematoma with no underlying calvarium fracture. 3. See also CT Face reported separately today. Preliminary findings on exam were reviewed in person with Dr. Reather Laurence on 10/11/2020 at 1130 hours. Electronically Signed   By: Genevie Ann M.D.   On: 10/17/2020 12:05   CT ANGIO NECK W OR WO CONTRAST  Result Date: 10/12/2020 CLINICAL DATA:  75 year old male with chest pain beginning at 0200 hours, found unresponsive at 0900 hours. Status post CPR. EXAM: CT ANGIOGRAPHY HEAD AND NECK TECHNIQUE: Multidetector CT imaging of the head and neck was performed using the standard protocol during bolus administration of intravenous contrast. Multiplanar CT image reconstructions and MIPs were obtained to evaluate the vascular anatomy. Carotid stenosis measurements (when applicable) are obtained utilizing NASCET criteria, using the distal internal carotid diameter as the denominator. CONTRAST:  130mL OMNIPAQUE IOHEXOL 350 MG/ML SOLN COMPARISON:  Head face and cervical spine CT reported separately today. FINDINGS: CTA NECK Skeleton: Cervical spine detailed separately today. Rib fractures also detailed separately today on chest CT (please see that report). Carious dentition. Upper chest: Detailed separately on chest CT today (please see that report). Other neck: Endotracheal tube in place into the chest. Small volume fluid in the pharynx. Patchy contusion/hematoma affecting the right parotid gland and right postauricular soft tissues, see face CT reported separately. No other acute  finding identified in the neck. Aortic arch: 3 vessel arch configuration. Calcified aortic atherosclerosis. Right carotid system: Tortuous brachiocephalic artery and proximal right CCA. Mild right CCA calcified plaque without stenosis. Mild to moderate calcified plaque at the right carotid  bifurcation without stenosis. Left carotid system: Similar tortuosity and mild calcified plaque affecting the left CCA and left carotid bifurcation with no stenosis to the skull base. Vertebral arteries: Mildly tortuous proximal right subclavian artery with mild plaque and no stenosis. Heavily calcified right vertebral artery origin which remains patent. There is moderate to severe right V1 segment stenosis (series 6, image 120). But the right vertebral artery is dominant and remains patent to the skull base without V2 or V3 segment stenosis. Proximal left subclavian artery calcified plaque is mild without stenosis. There is calcified plaque at the left vertebral artery origin but no associated stenosis. Non dominant left vertebral with tortuous V1 segment. Patent left vertebral to the skull base without additional plaque or stenosis. CTA HEAD Posterior circulation: Chronically dolichoectatic dominant distal right vertebral artery with circumferential calcified plaque but no significant stenosis. Right PICA origin remains normal. Non dominant left vertebral is diminutive beyond the patent left PICA origin but remains patent to the vertebrobasilar junction. Patent basilar artery with mild tortuosity, no stenosis. SCA and PCA origins are normal. Posterior communicating arteries are diminutive or absent. Bilateral PCA branches are patent, with mild irregularity on the right. Anterior circulation: Patent and mildly dolichoectatic bilateral ICA siphons with abundant calcified plaque, but no significant siphon stenosis. Normal ophthalmic artery origins. Patent carotid termini. Patent MCA and ACA origins. Dominant right A1 segment.  Anterior communicating artery is normal. Bilateral ACA branches are normal aside from mild tortuosity and irregularity. Left MCA M1 segment and bifurcation are patent without stenosis. Right MCA M1 segment and bifurcation are patent without stenosis. Bilateral MCA branches are normal aside from tortuosity. Venous sinuses: Early contrast timing, but the major dural venous sinuses appear to be patent. Anatomic variants: Dominant right vertebral artery. Dominant right ACA A1. Review of the MIP images confirms the above findings IMPRESSION: 1. Negative for large vessel occlusion. Widespread atherosclerosis in the head and neck. These results were discussed with Dr. Reather Laurence on 09/26/2020 at 1130 hours. 2. 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 But no other significant arterial stenosis in the head or neck. Generalized arterial tortuosity. 3. Acute findings in the chest reported separately on Chest CT today (please see that report). Electronically Signed   By: Genevie Ann M.D.   On: 10/06/2020 11:52   CT CHEST W CONTRAST  Result Date: 10/09/2020 CLINICAL DATA:  75 year old male with chest pain beginning at 0200 hours, found unresponsive at 0900 hours. Status post CPR. EXAM: CT CHEST, ABDOMEN, AND PELVIS WITH CONTRAST TECHNIQUE: Multidetector CT imaging of the chest, abdomen and pelvis was performed following the standard protocol during bolus administration of intravenous contrast. CONTRAST:  139mL OMNIPAQUE IOHEXOL 350 MG/ML SOLN COMPARISON:  Noncontrast CT Abdomen and Pelvis 08/26/2020. CTA head and neck today reported separately. FINDINGS: CT CHEST FINDINGS Cardiovascular: Stable cardiomegaly. Dense anterior pericardial calcification (series 5, image 40) without pericardial effusion. Thoracic aorta appears intact with chronic tortuosity and calcified atherosclerosis. Calcified coronary artery plaque. Other central mediastinal vascular structures appear  intact. Mediastinum/Nodes: Negative for mediastinal hematoma or lymphadenopathy. Lungs/Pleura: Layering bilateral low-density pleural effusions appear slightly larger since last month, small to moderate on the right and small on the left. Associated compressive atelectasis. No pulmonary contusion or pneumothorax identified. Endotracheal tube tip terminates near the carina (series 8, image 109) with retained secretions at the tip of the tube. Musculoskeletal: Osteopenia.  Numerous acute rib fractures. Fractured right lateral 1st through 7th ribs.  The right right 2nd through 4th anterior ribs are also fractured. Fractures are up to 1 full shaft width displaced (right lateral 5th rib fracture on series 7, image 82. There is a small volume of intercostal hematoma identified. Acutely fractured left anterior 2nd through 7th ribs, angulated and with up to 1 full shaft width displacement. Superimposed chronic left posterior rib fractures. Small volume intercostal hematoma. No sternal or thoracic vertebral fracture identified. Visible shoulder osseous structures appear intact. CT ABDOMEN PELVIS FINDINGS Hepatobiliary: Liver and gallbladder appear stable and intact. Pancreas: Stable and intact. Spleen: Stable and intact. Adrenals/Urinary Tract: Normal adrenal glands. 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 Contralateral left pelvis transplant kidney with appropriate contrast enhancement and delayed post-contrast excretion. Mildly distended but otherwise unremarkable urinary bladder. Stomach/Bowel: No dilated large or small bowel. Widespread large bowel diverticulosis. No free air or free fluid. An elongated appendix remains normal but tracks to the dome of the right liver under the diaphragm as before (series 5, image 50). Decompressed stomach. Vascular/Lymphatic: Diffuse calcified atherosclerosis including Aortoiliac calcified atherosclerosis. Major arterial structures  remain patent. Portal venous system is patent. No lymphadenopathy. Reproductive: Negative. Other: No pelvic free fluid. Generalized body wall edema appears stable since last month. Musculoskeletal: Osteopenia. Lumbar vertebrae, sacrum, SI joints, pelvis and proximal femurs appear stable and intact. IMPRESSION: 1. Numerous acute rib fractures: Right ribs 1 through 7 (with rib 2 through 4 flail segment) and left ribs 2 through 7. Mild intercostal hematoma, but no pneumothorax, hemothorax or pulmonary contusion. 2. No other acute traumatic injury identified in the chest, abdomen, or pelvis. 3. Low-density layering pleural effusions appear mildly increased from last month. Chronic atelectasis. 4. Endotracheal tube tip near the carina, retract 1 cm for more optimal position. 5. 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 Aortic Atherosclerosis (ICD10-I70.0). Above findings were reviewed in person with Dr. Reather Laurence on 10/15/2020 at 1130 hours. Electronically Signed   By: Genevie Ann M.D.   On: 10/12/2020 12:18   CT CERVICAL SPINE WO CONTRAST  Result Date: 10/17/2020 CLINICAL DATA:  75 year old male with chest pain beginning at 0200 hours, found unresponsive at 0900 hours. Status post CPR. EXAM: CT CERVICAL SPINE WITHOUT CONTRAST TECHNIQUE: Multidetector CT imaging of the cervical spine was performed without intravenous contrast. Multiplanar CT image reconstructions were also generated. COMPARISON:  Head and Face CT reported separately today. Cervical spine CT 05/26/2020. FINDINGS: Alignment: Straightening of cervical lordosis today. Cervicothoracic junction alignment is within normal limits. Bilateral posterior element alignment is within normal limits. Skull base and vertebrae: Osteopenia. Visualized skull base is intact. No atlanto-occipital dissociation. No acute osseous abnormality identified. Soft tissues  and spinal canal: No prevertebral fluid or swelling. No visible canal hematoma. Endotracheal tube within the airway. Right parotid region soft tissue injury reported on the face CT separately. Disc levels: Advanced chronic disc and endplate degeneration I7-P8 and C6-C7 appears stable. Upper chest: Reported separately today. Other: Diffuse calcified atherosclerosis. IMPRESSION: 1.  No acute traumatic injury identified in the cervical spine. 2. See also Face and Chest CT today reported separately. Electronically Signed   By: Genevie Ann M.D.   On: 09/24/2020 12:07   CT ABDOMEN PELVIS W CONTRAST  Result Date: 09/26/2020 CLINICAL DATA:  75 year old male with chest pain beginning at 0200 hours, found unresponsive at 0900 hours. Status post CPR. EXAM: CT CHEST, ABDOMEN, AND PELVIS WITH CONTRAST TECHNIQUE: Multidetector CT imaging of  the chest, abdomen and pelvis was performed following the standard protocol during bolus administration of intravenous contrast. CONTRAST:  15mL OMNIPAQUE IOHEXOL 350 MG/ML SOLN COMPARISON:  Noncontrast CT Abdomen and Pelvis 08/26/2020. CTA head and neck today reported separately. FINDINGS: CT CHEST FINDINGS Cardiovascular: Stable cardiomegaly. Dense anterior pericardial calcification (series 5, image 40) without pericardial effusion. Thoracic aorta appears intact with chronic tortuosity and calcified atherosclerosis. Calcified coronary artery plaque. Other central mediastinal vascular structures appear intact. Mediastinum/Nodes: Negative for mediastinal hematoma or lymphadenopathy. Lungs/Pleura: Layering bilateral low-density pleural effusions appear slightly larger since last month, small to moderate on the right and small on the left. Associated compressive atelectasis. No pulmonary contusion or pneumothorax identified. Endotracheal tube tip terminates near the carina (series 8, image 109) with retained secretions at the tip of the tube. Musculoskeletal: Osteopenia.  Numerous acute rib  fractures. Fractured right lateral 1st through 7th ribs. The right right 2nd through 4th anterior ribs are also fractured. Fractures are up to 1 full shaft width displaced (right lateral 5th rib fracture on series 7, image 82. There is a small volume of intercostal hematoma identified. Acutely fractured left anterior 2nd through 7th ribs, angulated and with up to 1 full shaft width displacement. Superimposed chronic left posterior rib fractures. Small volume intercostal hematoma. No sternal or thoracic vertebral fracture identified. Visible shoulder osseous structures appear intact. CT ABDOMEN PELVIS FINDINGS Hepatobiliary: Liver and gallbladder appear stable and intact. Pancreas: Stable and intact. Spleen: Stable and intact. Adrenals/Urinary Tract: Normal adrenal glands. 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 Contralateral left pelvis transplant kidney with appropriate contrast enhancement and delayed post-contrast excretion. Mildly distended but otherwise unremarkable urinary bladder. Stomach/Bowel: No dilated large or small bowel. Widespread large bowel diverticulosis. No free air or free fluid. An elongated appendix remains normal but tracks to the dome of the right liver under the diaphragm as before (series 5, image 50). Decompressed stomach. Vascular/Lymphatic: Diffuse calcified atherosclerosis including Aortoiliac calcified atherosclerosis. Major arterial structures remain patent. Portal venous system is patent. No lymphadenopathy. Reproductive: Negative. Other: No pelvic free fluid. Generalized body wall edema appears stable since last month. Musculoskeletal: Osteopenia. Lumbar vertebrae, sacrum, SI joints, pelvis and proximal femurs appear stable and intact. IMPRESSION: 1. Numerous acute rib fractures: Right ribs 1 through 7 (with rib 2 through 4 flail segment) and left ribs 2 through 7. Mild intercostal hematoma, but no pneumothorax, hemothorax or  pulmonary contusion. 2. No other acute traumatic injury identified in the chest, abdomen, or pelvis. 3. Low-density layering pleural effusions appear mildly increased from last month. Chronic atelectasis. 4. Endotracheal tube tip near the carina, retract 1 cm for more optimal position. 5. 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 Aortic Atherosclerosis (ICD10-I70.0). Above findings were reviewed in person with Dr. Reather Laurence on 09/30/2020 at 1130 hours. Electronically Signed   By: Genevie Ann M.D.   On: 10/16/2020 12:18   DG Pelvis Portable  Result Date: 09/21/2020 CLINICAL DATA:  75 year old male with chest pain beginning at 0200 hours, found unresponsive at 0900 hours. Status post CPR. EXAM: PORTABLE PELVIS 1-2 VIEWS COMPARISON:  CT Abdomen and Pelvis 08/26/2020. FINDINGS: Portable AP view at 1100 hours severe diffuse calcified atherosclerosis again noted. Chronic dystrophic calcifications in the right hemipelvis, probably calcified atrophied renal transplant based on CT appearance. Femoral heads normally located. Superior iliac wings are excluded. No pelvis fracture identified. Grossly intact proximal femurs. IMPRESSION: 1. No acute  fracture or dislocation identified about the visible pelvis. 2. Diffuse severe calcified atherosclerosis, calcified pelvic transplant kidney. Electronically Signed   By: Genevie Ann M.D.   On: 09/28/2020 11:26   DG Chest Portable 1 View  Result Date: 10/06/2020 CLINICAL DATA:  OG tube placement. EXAM: PORTABLE CHEST 1 VIEW COMPARISON:  Single-view of the chest earlier today and 05/26/2020. FINDINGS: New OG tube is in place with the side port just above the gastroesophageal junction. Recommend advancement 2-3 cm. Right subclavian catheter is again seen. Endotracheal tube tip remains 1.3 cm above the carina. There is cardiomegaly. Aortic atherosclerosis. No focal airspace  disease. IMPRESSION: NG tube tip is just within the stomach. Recommend advancement of 2-3 cm. Endotracheal tube tip remains 1.3 cm above the carina. The tube could be pulled back 1-2 cm for better positioning. Electronically Signed   By: Inge Rise M.D.   On: 10/06/2020 12:28   DG Chest Port 1 View  Result Date: 09/22/2020 CLINICAL DATA:  75 year old male with chest pain beginning at 0200 hours, found unresponsive at 0900 hours. Status post CPR. EXAM: PORTABLE CHEST 1 VIEW COMPARISON:  Portable chest 08/31/2020 and earlier. FINDINGS: Portable AP supine view at 1059 hours. Endotracheal tube tip in good position about 18 mm above the carina. Left chest resuscitation pads. Lung volumes within normal limits. No pneumothorax, pleural effusion or confluent pulmonary opacity identified on this supine view which excludes the left lateral lung base. Stable cardiac size and mediastinal contours. Calcified aortic atherosclerosis. There are chronic left posterior rib fractures. No acute displaced rib fracture identified. IMPRESSION: 1. Endotracheal tube tip in good position. 2.  No acute cardiopulmonary abnormality identified. Electronically Signed   By: Genevie Ann M.D.   On: 09/24/2020 11:24   CT ANGIO HEAD CODE STROKE  Result Date: 09/21/2020 CLINICAL DATA:  75 year old male with chest pain beginning at 0200 hours, found unresponsive at 0900 hours. Status post CPR. EXAM: CT ANGIOGRAPHY HEAD AND NECK TECHNIQUE: Multidetector CT imaging of the head and neck was performed using the standard protocol during bolus administration of intravenous contrast. Multiplanar CT image reconstructions and MIPs were obtained to evaluate the vascular anatomy. Carotid stenosis measurements (when applicable) are obtained utilizing NASCET criteria, using the distal internal carotid diameter as the denominator. CONTRAST:  122mL OMNIPAQUE IOHEXOL 350 MG/ML SOLN COMPARISON:  Head face and cervical spine CT reported separately today.  FINDINGS: CTA NECK Skeleton: Cervical spine detailed separately today. Rib fractures also detailed separately today on chest CT (please see that report). Carious dentition. Upper chest: Detailed separately on chest CT today (please see that report). Other neck: Endotracheal tube in place into the chest. Small volume fluid in the pharynx. Patchy contusion/hematoma affecting the right parotid gland and right postauricular soft tissues, see face CT reported separately. No other acute finding identified in the neck. Aortic arch: 3 vessel arch configuration. Calcified aortic atherosclerosis. Right carotid system: Tortuous brachiocephalic artery and proximal right CCA. Mild right CCA calcified plaque without stenosis. Mild to moderate calcified plaque at the right carotid bifurcation without stenosis. Left carotid system: Similar tortuosity and mild calcified plaque affecting the left CCA and left carotid bifurcation with no stenosis to the skull base. Vertebral arteries: Mildly tortuous proximal right subclavian artery with mild plaque and no stenosis. Heavily calcified right vertebral artery origin which remains patent. There is moderate to severe right V1 segment stenosis (series 6, image 120). But the right vertebral artery is dominant and remains patent to the skull base without V2 or  V3 segment stenosis. Proximal left subclavian artery calcified plaque is mild without stenosis. There is calcified plaque at the left vertebral artery origin but no associated stenosis. Non dominant left vertebral with tortuous V1 segment. Patent left vertebral to the skull base without additional plaque or stenosis. CTA HEAD Posterior circulation: Chronically dolichoectatic dominant distal right vertebral artery with circumferential calcified plaque but no significant stenosis. Right PICA origin remains normal. Non dominant left vertebral is diminutive beyond the patent left PICA origin but remains patent to the vertebrobasilar  junction. Patent basilar artery with mild tortuosity, no stenosis. SCA and PCA origins are normal. Posterior communicating arteries are diminutive or absent. Bilateral PCA branches are patent, with mild irregularity on the right. Anterior circulation: Patent and mildly dolichoectatic bilateral ICA siphons with abundant calcified plaque, but no significant siphon stenosis. Normal ophthalmic artery origins. Patent carotid termini. Patent MCA and ACA origins. Dominant right A1 segment. Anterior communicating artery is normal. Bilateral ACA branches are normal aside from mild tortuosity and irregularity. Left MCA M1 segment and bifurcation are patent without stenosis. Right MCA M1 segment and bifurcation are patent without stenosis. Bilateral MCA branches are normal aside from tortuosity. Venous sinuses: Early contrast timing, but the major dural venous sinuses appear to be patent. Anatomic variants: Dominant right vertebral artery. Dominant right ACA A1. Review of the MIP images confirms the above findings IMPRESSION: 1. Negative for large vessel occlusion. Widespread atherosclerosis in the head and neck. These results were discussed with Dr. Reather Laurence on 10/12/2020 at 1130 hours. 2. 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 But no other significant arterial stenosis in the head or neck. Generalized arterial tortuosity. 3. Acute findings in the chest reported separately on Chest CT today (please see that report). Electronically Signed   By: Genevie Ann M.D.   On: 10/09/2020 11:52   CT MAXILLOFACIAL WO CONTRAST  Result Date: 10/13/2020 CLINICAL DATA:  75 year old male with chest pain beginning at 0200 hours, found unresponsive at 0900 hours. Status post CPR. EXAM: CT MAXILLOFACIAL WITHOUT CONTRAST TECHNIQUE: Multidetector CT imaging of the maxillofacial structures was performed. Multiplanar CT image reconstructions were also generated. COMPARISON:  Head and  cervical spine CT today reported separately. FINDINGS: Osseous: Carious dentition.  Mandible intact and normally aligned. Subtle minimally displaced fracture of the posterior wall of the right maxillary sinus is new compared to 05/26/2020 (series 4, image 58). No other maxilla, zygoma, or nasal bone fracture identified. Osteopenia. Central skull base appears intact. Orbits: Intact orbital walls. Left periorbital and forehead superficial hematoma with trace soft tissue gas (series 3, image 82). Underlying left globe and intraorbital soft tissues remain normal. Right orbits soft tissues appear stable and normal. Sinuses: Trace fluid layering in the right maxillary sinus. Other paranasal sinuses and mastoids are stable and well pneumatized. Soft tissues: In addition to left forehead and periorbital soft tissue injury there is confluent stranding and hazy opacity involving the right parotid gland and overlying subcutaneous fat. Similar subcutaneous fat stranding overlying the superior right sternocleidomastoid muscle compatible with posttraumatic hematoma or contusion. Underlying right middle ear and mastoids remain pneumatized. Intubated, small volume fluid in the pharynx. Diffuse neck and scalp vessel calcified atherosclerosis. Limited intracranial: Reported separately today. IMPRESSION: 1. Subtle fracture of the posterior wall of the right maxillary sinus is new since September and likely acute. Trace fluid within the sinus. 2. Hematoma/contusion involving the right parotid gland and regional subcutaneous tissues. No underlying fracture. 3.  Left forehead and periorbital hematoma without underlying fracture. The above were discussed by telephone with Dr. Reather Laurence at 12:19 hours. Electronically Signed   By: Genevie Ann M.D.   On: 10/03/2020 12:21    Procedures Procedure Name: Intubation Date/Time: 10/17/2020 5:11 PM Performed by: Fredia Sorrow, MD Pre-anesthesia Checklist: Patient identified, Patient being  monitored, Emergency Drugs available, Timeout performed and Suction available Oxygen Delivery Method: Non-rebreather mask Preoxygenation: Pre-oxygenation with 100% oxygen Induction Type: Rapid sequence Ventilation: Mask ventilation without difficulty Laryngoscope Size: Glidescope and 3 Tube size: 8.0 mm Number of attempts: 1 Placement Confirmation: ETT inserted through vocal cords under direct vision,  CO2 detector and Breath sounds checked- equal and bilateral Secured at: 26 cm Tube secured with: ETT holder     .Marland KitchenLaceration Repair  Date/Time: 10/15/2020 5:15 PM Performed by: Fredia Sorrow, MD Authorized by: Fredia Sorrow, MD   Consent:    Consent obtained:  Emergent situation   Risks, benefits, and alternatives were discussed: not applicable   Universal protocol:    Relevant documents present and verified: yes     Test results available: yes     Imaging studies available: yes     Immediately prior to procedure, a time out was called: yes     Patient identity confirmed:  Arm band Anesthesia:    Anesthesia method:  None (Patient was sedated following intubation) Laceration details:    Location:  Face   Face location:  Forehead   Length (cm):  5 Pre-procedure details:    Preparation:  Patient was prepped and draped in usual sterile fashion Exploration:    Limited defect created (wound extended): no     Contaminated: no   Treatment:    Area cleansed with:  Saline   Amount of cleaning:  Standard   Debridement:  None   Undermining:  None Skin repair:    Repair method:  Sutures   Suture size:  5-0   Suture material:  Prolene   Number of sutures:  4 Approximation:    Approximation:  Loose Repair type:    Repair type:  Simple Post-procedure details:    Dressing:  Open (no dressing)             (including critical care time)  CRITICAL CARE Performed by: Fredia Sorrow Total critical care time: 60 minutes Critical care time was exclusive of  separately billable procedures and treating other patients. Critical care was necessary to treat or prevent imminent or life-threatening deterioration. Critical care was time spent personally by me on the following activities: development of treatment plan with patient and/or surrogate as well as nursing, discussions with consultants, evaluation of patient's response to treatment, examination of patient, obtaining history from patient or surrogate, ordering and performing treatments and interventions, ordering and review of laboratory studies, ordering and review of radiographic studies, pulse oximetry and re-evaluation of patient's condition.   Medications Ordered in ED Medications  ceFAZolin (ANCEF) IVPB 2g/100 mL premix (has no administration in time range)  fentaNYL (SUBLIMAZE) injection 25 mcg (has no administration in time range)  fentaNYL 2564mcg in NS 279mL (63mcg/ml) infusion-PREMIX (100 mcg/hr Intravenous Infusion Verify 10/11/2020 1600)  fentaNYL (SUBLIMAZE) bolus via infusion 25 mcg (has no administration in time range)  fentaNYL (SUBLIMAZE) 100 MCG/2ML injection (has no administration in time range)  propofol (DIPRIVAN) 1000 MG/100ML infusion (has no administration in time range)  succinylcholine (ANECTINE) 200 MG/10ML syringe (has no administration in time range)  etomidate (AMIDATE) 2 MG/ML injection (has no administration in time  range)  polyethylene glycol (MIRALAX / GLYCOLAX) packet 17 g (has no administration in time range)  heparin injection 5,000 Units (has no administration in time range)  famotidine (PEPCID) tablet 20 mg (has no administration in time range)  ondansetron (ZOFRAN) injection 4 mg (has no administration in time range)  docusate (COLACE) 50 MG/5ML liquid 100 mg (has no administration in time range)  mycophenolate (CELLCEPT) oral suspension 50 mg/mL (has no administration in time range)  prednisoLONE tablet 5 mg (has no administration in time range)  tacrolimus  (PROGRAF) 1 mg/mL oral suspension 1 mg (has no administration in time range)  furosemide (LASIX) injection 80 mg (has no administration in time range)  etomidate (AMIDATE) injection (20 mg Intravenous Given 10/12/2020 1053)  succinylcholine (ANECTINE) injection (100 mg Intravenous Given 09/23/2020 1055)  levETIRAcetam (KEPPRA) IVPB 1000 mg/100 mL premix (0 mg Intravenous Stopped 10/19/2020 1204)  fentaNYL (SUBLIMAZE) injection (100 mcg Intravenous Given 10/10/2020 1115)  iohexol (OMNIPAQUE) 350 MG/ML injection 100 mL (100 mLs Intravenous Contrast Given 10/16/2020 1132)  dextrose 50 % solution (50 mLs  Given 10/15/2020 1533)    ED Course  I have reviewed the triage vital signs and the nursing notes.  Pertinent labs & imaging results that were available during my care of the patient were reviewed by me and considered in my medical decision making (see chart for details).    MDM Rules/Calculators/A&P                         Patient arrived by EMS as post cardiac arrest.  But he also had significant head trauma.  It appeared that patient was may be having some chest pain at 2 in the morning and then was not very responsive had some frothy breathing later in the morning wife went to call.  And then when she came back in he had somehow fallen on the floor.  Had a laceration to his left forehead area with a lot of swelling around his left eye.  The laceration went into the eyebrow area may be a couple millimeters into the upper eyelid.  Because of the trauma patient also was a level 1 trauma.  However when patient arrived did not require any more CPR did not require any drugs.  He came with a Edison Pace airway which was changed over to endotracheal tube here.  Patient really remained stable.  Blood pressures were stable EKG cardiac monitoring stable.  Never hypotensive no arrhythmias.  So it made [place] think that this was may be primarily a respiratory arrest.  Work-up for the head trauma and any other type of trauma was all  negative.  Patient had CT head CT a head and neck.  Had CT chest abdomen and pelvis as well.  No evidence of any significant findings.  General surgery trauma surgery was involved.  And then critical care was called and they admitted the patient.  The laceration was sewn up by me the intubation was done by me.  Right subclavian triple-lumen was placed by surgery.  Of concerned that really was never any movement of his extremities.  He did kind of block on the endotracheal tube if his sedation got light patient never really had any purposeful movement or any movement of the extremities all raising concerns for anoxic brain injury.  Final Clinical Impression(s) / ED Diagnoses Final diagnoses:  Trauma  Cardiac arrest [facility])    Rx / DC Orders ED Discharge Orders    None  Fredia Sorrow, MD 10/02/2020 1718

## 2020-10-01 NOTE — Consult Note (Addendum)
[facility] Surgery Consult Note  [name] 03-22-1946  [id number].    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 Per wife and EMS, patient went to his bedroom around 0200 c/o chest discomfort but said he was ok and went to bed. Patients wife states she woke up around 0930 to find patient laying in bed, with one foot off of the bed, foaming at the mouth but still breathing. She stepped out to call EMS then found him on the ground. Upon EMS arrival the patient had no pulse. CPR was started and after 2 rounds of epi he had ROSC. In the trauma bay the patient was not moving any extremities, no verbal response, and was immediately intubated by EDP. He was noted to have left periorbital hematoma with a laceration and unequal pupils. Neurosurgery was called for suspected head injury. Laceration also noted to his right forearm. Patient was taken to the CT scanner.  Nonsmoker No alcohol or illicit drug use Lives at home with his wife Per wife, takes a low-dose ASA daily and no other blood thinners.  Review of Systems  Unable to perform ROS: Intubated  No family history on file.  No past medical history on file.  Social History:  has no history on file for tobacco use, alcohol use, and drug use.  Allergies: Not on File  (Not in a hospital admission)   Prior to Admission medications   Not on File    There were no vitals taken for this visit. Physical Exam: General: unresponsive, chronically ill appearing male HEENT: head is normocephalic.  Sclera are noninjected. Pupils unequal L>R,  Ears  and nose without any masses or lesions.  Mouth is pink and moist. Dentition fair. 3-4cm laceration noted left supraorbital and extends slightly into the proximal lid   Heart: regular, rate, and rhythm.  Normal s1,s2. No obvious murmurs, gallops, or rubs noted. Palpable pedal pulses bilaterally  Lungs: CTAB, no wheezes, rhonchi; crackles R lung base, Mechanically ventilated Abd: soft, protuberant, non-tender,  no masses. Ventral hernia present. Previous laparotomy scar present. MS:1+ pitting edema BLE with chronic skin changes consistent with venous stasis.  Psych: nonresponsive, unable to assess Neuro: intubated upon arrival in the trauma bay, unable to assess Skin: warm and dry. Degloving injury noted to distal right forearm   No results found for this or any previous visit (from the past 66 hour(s)). No results found.  Anti-infectives (From admission, onward)   Start     Dose/Rate Route Frequency Ordered Stop   09/30/2020 1115  ceFAZolin (ANCEF) IVPB 2g/100 mL premix        2 g 200 mL/hr over 30 Minutes Intravenous  Once 10/17/2020 1111       Assessment/Plan Fall - unwitnessed Cardiac arrest - ROSC after 2 rounds of epi with EMS.  VDRF Left supraorbital laceration  Right forearm degloving -local care with adaptic or xeroform gauze and dry dressing daily.  B/l rib fxs R 1-7 (rib 2-4 flail segment) L 2-7, no PTX- Acute FX in the setting of fall and CPR. CXR in AM. Pain control. ?R maxillary sinus fx - ENT, Dr. Marcelline Deist,  consulted  H/o renal transplant x2 in 1983 and 2005 (takes Cellcept, prednisone, prograf) CKD stage IIIb (creatinine baseline 2.08-2.2) Chronic systolic and diastolic HF with EF 66-29% Bilateral R>L pleural effusions - chronic, slightly worse compared to CT in 08/2020 HTN DM2 HLD Afib/flutter not on anticoagulation  ID - ancef VTE - SCD's FEN - NPO, OG Foley - none  Plan - 75 y/o M with multiple medical co-morbidities who presented as a level 1 trauma after  fall and cardiac arrest, pulseless for unknown period of time prior to CPR and ROSC. Above traumatic injuries do not explain patients unresponsive state - CT without acute neurologic injury. Recommend admission to critical care for further management. Trauma service will follow for now given multiple rib fractures.    Total Critical Care time: 70 minutes   [name], [facility] Surgery 09/29/2020, 10:55 AM Please see Amion for pager number during day hours 7:00am-4:30pm

## 2020-10-01 NOTE — Progress Notes (Signed)
[facility] Progress Note Patient Name: Braylan Faul DOB: 09-06-1946 MRN: [id number]   Date of Service  10/16/2020  HPI/Events of Note  Hypoglycemia - Blood glucose = 56. No TF or insulin therapy.   eICU Interventions  Plan: 1. D10W IV infusion to run at 40 mL/hour.     Intervention Category Major Interventions: Other:  Lysle Dingwall 09/21/2020, 8:37 PM

## 2020-10-01 NOTE — Progress Notes (Signed)
STAT EEG complete - results pending. ? ?

## 2020-10-01 NOTE — Progress Notes (Signed)
Ventilator patient transported from ED Trauma A to 2M13 without issues.

## 2020-10-01 NOTE — ED Notes (Signed)
To CT

## 2020-10-01 NOTE — ED Triage Notes (Signed)
To ED via [facility] from home with EMS stating that he received CPR x 12 minutes-- called by spouse to home, she stated pt c/o chest pain at 2am, but went to sleep. At 9am c/o pain, then found face down, unresponsive in living room.  On arrival pt is unresponsive, has King airway in place, IO in right tibia- received Epi x 2 , CaCl x 1 amp, Bicarb x 1 amp per EMS-  Does have some spontaneous resp,  Intubated per Dr. Rogene Houston- 7.5 ETT /26 at lip. Good color change, breath sounds auscultated.

## 2020-10-01 NOTE — Procedures (Signed)
Patient Name: Gareth Fitzner  MRN: [id number]  Epilepsy Attending: Lora Havens  Referring Physician/Provider: Dr Reather Laurence Date:  10/06/2020 Duration: 23.42 mins  Patient history: 75 year old male status post cardiac arrest.  EEG to evaluate for seizures.  Level of alertness: Comatose  AEDs during EEG study: None  Technical aspects: This EEG study was done with scalp electrodes positioned according to the 10-20 International system of electrode placement. Electrical activity was acquired at a sampling rate of 500Hz  and reviewed with a high frequency filter of 70Hz  and a low frequency filter of 1Hz . EEG data were recorded continuously and digitally stored.   Description: EEG showed currently generalized background suppression EEG was not reactive due to calcium lesion.  Hyperventilation and photic stimulation were not performed.     Of note, EEG was difficult to interpret due to significant myogenic artifact.  ABNORMALITY -Background suppression, generalized  IMPRESSION: This technically difficult study is suggestive of profound diffuse encephalopathy, nonspecific etiology.  No seizures or epileptiform discharges were seen throughout the recording.  Iline Buchinger Barbra Sarks

## 2020-10-01 NOTE — Progress Notes (Signed)
Patient arrived to the unit from ED.  On arrival patient had bleeding to left eye, swollen, and stiches above eyelid Bilateral skin tears to upper extremities and lower extremities wrapped with kerlix Currently has Fentanyl going at 167mcg Blood Sugar 44, gave amp of D50 and rechecked sugar 79

## 2020-10-01 NOTE — Progress Notes (Signed)
Hypoglycemic Event  CBG: 56   Treatment: D50 50 mL (25 gm)  Symptoms: None  Follow-up CBG: Time:2003 CBG Result:126  Possible Reasons for Event: Inadequate meal intake  Comments/MD notified:Elink     [name]

## 2020-10-01 NOTE — Progress Notes (Signed)
Responded to level 1 to support patient and staff.  Patient fell hit head.Marland Kitchen Pt. Gone for CT scan.  No direct contact with patient due to ED staff working with patient.  No family.  Will follow as needed.  Jaclynn Major, [place], [facility], Pager [phone]

## 2020-10-01 NOTE — H&P (Signed)
NAME:  [name], MRN:  [id number], DOB:  [birth year], LOS: 0 ADMISSION DATE:  09/21/2020, CONSULTATION DATE:  1/12 REFERRING MD:  Dr. Rogene Houston, CHIEF COMPLAINT:  Cardiac arrest   Brief History:  75 year old male with renal transplant history admitted 1/12 after PEA arrest 12 minutes.  History of Present Illness:  75 year old male with PMH as below, which is significant for CKD (previously on HD, but is s/p renal transplant in 2005 at [facility]), AF not on [facility], who presented to [name] ED after cardiac arrest athome 1/12. Apparently 1/11 in the evening hours he developed chest pain and dyspnea, but proceeded to go to bed. Then 1/12 AM his wife awoke around 0930 and found him to be unresponsive and foaming at the mouth. He was still breathing. As she called EMS he fell to the floor. Upon EMS arrival the patient was pulseless and ACLS was initiated. CPR was performed for approximately 12 minutes with two doses of epinephrine. He suffered injury to his L periorbit at some point during all this, raising concern for head injury and he was treated as a level one trauma in ED. Upon arrival he was unresponsive and was immediately intubated. He was emergently taken for pan-CT scan. No operable injury was identified. PCCM asked to admit.   Past Medical History:  AF (no AC), CKD, Renal transplant, HFpEF, HTN, HLD, DM, PAD  Significant Hospital Events:  1/12 admit for PEA arrest  Consults:  Neurosurgery Trauma surgery  Procedures:  CVL R subclavian 1/12 > ETT 1/12 >  Significant Diagnostic Tests:  1/12 CTA head > no large vessel occlusion. Mod-severe stenosis of the dominant R vert.  1/12 maxillofacial > Subtle fracture of the posterior wall of the right maxillary sinus is new since September and likely acute. Trace fluid within the sinus. Hematoma/contusion involving the right parotid gland and regional subcutaneous tissues. Left forehead and periorbital hematoma without underlying fracture. 1/12  CT chest/abd/pelv > Numerous acute rib fractures: Right ribs 1 through 7 (with rib 2 through 4 flail segment) and left ribs 2 through 7. Mild intercostal hematoma, but no pneumothorax, hemothorax or pulmonary contusion. Low-density layering pleural effusions appear mildly increased from last month. Chronic atelectasis.  Micro Data:    Antimicrobials:     Interim History / Subjective:    Objective   Blood pressure (!) 163/136, pulse 75, resp. rate (!) 22, height 5\' 11"  (1.803 m), weight 86.2 kg, SpO2 99 %.    Vent Mode: PRVC FiO2 (%):  [100 %] 100 % Set Rate:  [20 bmp] 20 bmp Vt Set:  [600 mL] 600 mL PEEP:  [5 cmH20] 5 cmH20 Plateau Pressure:  [25 cmH20] 25 cmH20   Intake/Output Summary (Last 24 hours) at 10/09/2020 1259 Last data filed at 10/16/2020 1204 Gross per 24 hour  Intake 100 ml  Output --  Net 100 ml   Filed Weights   10/12/2020 1142  Weight: 86.2 kg    Examination: General: Frail elderly appearing male in NAD HENT: ETT, large L periorbital hematoma and laceration.  Lungs: Coarse bilaterally Cardiovascular: RRR, no MRG Abdomen: Soft, non-distended. Large abdominal hernia appreciated.  Extremities: R forearm degloving injury.  Neuro: Sedated GU: Unremarkable  Resolved Hospital Problem list     Assessment & Plan:   PEA cardiac arrest: Witnessed, no bystander CPR. 12 min ACLS. Suspect primary cardiac etiology. Seems like primary respiratory arrest. History would support pulmonary edema prompting arrest.  - Admit to ICU - Avoid fever -  Echocardiogram - EEG monitoring  Acute hypoxic respiratory failure - presumably pulmonary edema considering frothy secretions seen on scene and during intubation per EDP. - Full vent support. - Wean as mental status allows. - Bronchial hygiene. - Follow CXR. - Diuresis as tolerated.   CKD:  Hx renal transplant x 2 (1983, 2005 and now on Cellcept, Prograf, Prednisone). - Follow BMP - Continue cell-cept, prograf,  prednisone  Acute on chronic HFrEF: (LVEF 40-45% with grade 3 DD from recent echo in care everywhere) - diuresis as tolerated - repeat Echo pending - holding home   DM - CBG monitoring and SSI  R, L rib fx: flail segments by CT on the right R maxillary sinus wall fracture - Surgery following - Pain management.   R Forearm degloving injury - WOC consult  Severe PAD - Supportive care  Best practice (evaluated daily)  Diet: NPO Pain/Anxiety/Delirium protocol (if indicated): Fentanyl infusion VAP protocol (if indicated): Per protocol DVT prophylaxis: SQH GI prophylaxis: Pepcid Glucose control: SSI Mobility: BR Disposition:FULL  Goals of Care:  Last date of multidisciplinary goals of care discussion: 1/12 Family and staff present: *NP, Wife via phone Summary of discussion: Full scope of tx Follow up goals of care discussion due: 1/20 Code Status: FULL code  Labs   CBC: Recent Labs  Lab 10/16/2020 1218 10/13/2020 1222  HGB 8.5* 9.2*  HCT 25.0* 27.0*    Basic Metabolic Panel: Recent Labs  Lab 10/08/2020 1218 09/21/2020 1222  NA 146* 147*  K 5.3* 5.0  CL  --  114*  GLUCOSE  --  84  BUN  --  41*  CREATININE  --  2.20*   GFR: Estimated Creatinine Clearance: 31.9 mL/min (A) (by C-G formula based on SCr of 2.2 mg/dL (H)). No results for input(s): PROCALCITON, WBC, LATICACIDVEN in the last 168 hours.  Liver Function Tests: No results for input(s): AST, ALT, ALKPHOS, BILITOT, PROT, ALBUMIN in the last 168 hours. No results for input(s): LIPASE, AMYLASE in the last 168 hours. No results for input(s): AMMONIA in the last 168 hours.  ABG    Component Value Date/Time   PHART 7.298 (L) 09/29/2020 1218   PCO2ART 49.4 (H) 10/09/2020 1218   PO2ART 85 10/10/2020 1218   HCO3 24.2 10/09/2020 1218   TCO2 23 09/29/2020 1222   ACIDBASEDEF 2.0 09/21/2020 1218   O2SAT 95.0 10/13/2020 1218     Coagulation Profile: No results for input(s): INR, PROTIME in the last 168  hours.  Cardiac Enzymes: No results for input(s): CKTOTAL, CKMB, CKMBINDEX, TROPONINI in the last 168 hours.  HbA1C: No results found for: HGBA1C  CBG: No results for input(s): GLUCAP in the last 168 hours.  Review of Systems:   Patient is encephalopathic and/or intubated. Therefore history has been obtained from chart review.   Past Medical History:  He,  has no past medical history on file.   Surgical History:    Social History:      Family History:  His family history is not on file.   Allergies Not on File   Home Medications  Prior to Admission medications   Not on File     Critical care time: 60 minutes      Georgann Housekeeper, AGACNP-BC [place] for personal pager PCCM on call pager [phone]  10/04/2020 2:18 PM

## 2020-10-01 NOTE — ED Notes (Signed)
Report given to Valentine, RN on 2100

## 2020-10-01 NOTE — Progress Notes (Signed)
Orthopedic Tech Progress Note Patient Details:  [name] 17-May-1946 [id number] Level 1 Trauma. Not needed Patient ID: Besnik Febus, male   DOB: Oct 07, 1945, 75 y.o.   MRN: [id number]   Chip Boer 09/21/2020, 11:05 AM

## 2020-10-01 NOTE — Procedures (Signed)
Central Venous Catheter Insertion Procedure Note  [name]  [id number]  [birth year]  Date:09/29/2020  Time:12:28 PM   Provider Performing:Geovonni Meyerhoff S Kailee Essman   Procedure: Insertion of Non-tunneled Central Venous Catheter(36556) without US guidance  Indication(s) Difficult access  Consent Unable to obtain consent due to emergent nature of procedure.  Anesthesia Topical only with 1% lidocaine   Timeout Verified patient identification, verified procedure, site/side was marked, verified correct patient position, special equipment/implants available, medications/allergies/relevant history reviewed, required imaging and test results available.  Sterile Technique Maximal sterile technique including full sterile barrier drape, hand hygiene, sterile gown, sterile gloves, mask, hair covering, sterile ultrasound probe cover (if used).  Procedure Description Area of catheter insertion was cleaned with chlorhexidine and draped in sterile fashion.  Without real-time ultrasound guidance a central venous catheter was placed into the right subclavian vein. Nonpulsatile blood flow and easy flushing noted in all ports.  The catheter was sutured in place and sterile dressing applied.  Complications/Tolerance None; patient tolerated the procedure well. Chest X-ray is ordered to verify placement for internal jugular or subclavian cannulation.   Chest x-ray is not ordered for femoral cannulation.  EBL Minimal  Specimen(s) None  [name], PA-C General and Trauma Surgery

## 2020-10-02 ENCOUNTER — Inpatient Hospital Stay (HOSPITAL_COMMUNITY): Payer: Medicare Other

## 2020-10-02 ENCOUNTER — Encounter (HOSPITAL_BASED_OUTPATIENT_CLINIC_OR_DEPARTMENT_OTHER): Payer: Medicare Other | Admitting: Internal Medicine

## 2020-10-02 DIAGNOSIS — J9601 Acute respiratory failure with hypoxia: Secondary | ICD-10-CM | POA: Diagnosis not present

## 2020-10-02 DIAGNOSIS — S270XXA Traumatic pneumothorax, initial encounter: Secondary | ICD-10-CM

## 2020-10-02 DIAGNOSIS — I469 Cardiac arrest, cause unspecified: Secondary | ICD-10-CM | POA: Diagnosis not present

## 2020-10-02 DIAGNOSIS — R579 Shock, unspecified: Secondary | ICD-10-CM | POA: Diagnosis not present

## 2020-10-02 LAB — ECHOCARDIOGRAM LIMITED
AV Peak grad: 6.9 mmHg
Ao pk vel: 1.31 m/s
Area-P 1/2: 3.48 cm2
Height: 71 in
S' Lateral: 2.75 cm
Weight: 3075.86 oz

## 2020-10-02 LAB — BASIC METABOLIC PANEL
Anion gap: 15 (ref 5–15)
BUN: 45 mg/dL — ABNORMAL HIGH (ref 8–23)
CO2: 15 mmol/L — ABNORMAL LOW (ref 22–32)
Calcium: 9.3 mg/dL (ref 8.9–10.3)
Chloride: 115 mmol/L — ABNORMAL HIGH (ref 98–111)
Creatinine, Ser: 2.76 mg/dL — ABNORMAL HIGH (ref 0.61–1.24)
GFR, Estimated: 24 mL/min — ABNORMAL LOW (ref >60)
Glucose, Bld: 168 mg/dL — ABNORMAL HIGH (ref 70–99)
Potassium: 4.9 mmol/L (ref 3.5–5.1)
Sodium: 145 mmol/L (ref 135–145)

## 2020-10-02 LAB — MAGNESIUM
Magnesium: 1.5 mg/dL — ABNORMAL LOW (ref 1.7–2.4)
Magnesium: 2 mg/dL (ref 1.7–2.4)
Magnesium: 1.8 mg/dL (ref 1.7–2.4)

## 2020-10-02 LAB — POCT I-STAT EG7
Acid-base deficit: 7 mmol/L — ABNORMAL HIGH (ref 0.0–2.0)
Bicarbonate: 18.1 mmol/L — ABNORMAL LOW (ref 20.0–28.0)
Calcium, Ion: 1.26 mmol/L (ref 1.15–1.40)
HCT: 23 % — ABNORMAL LOW (ref 39.0–52.0)
Hemoglobin: 7.8 g/dL — ABNORMAL LOW (ref 13.0–17.0)
O2 Saturation: 54 %
Patient temperature: 100.4
Potassium: 4.5 mmol/L (ref 3.5–5.1)
Sodium: 141 mmol/L (ref 135–145)
TCO2: 19 mmol/L — ABNORMAL LOW (ref 22–32)
pCO2, Ven: 37.3 mmHg — ABNORMAL LOW (ref 44.0–60.0)
pH, Ven: 7.299 (ref 7.250–7.430)
pO2, Ven: 33 mmHg (ref 32.0–45.0)

## 2020-10-02 LAB — HEPATIC FUNCTION PANEL
ALT: 22 U/L (ref 0–44)
AST: 48 U/L — ABNORMAL HIGH (ref 15–41)
Albumin: 2.4 g/dL — ABNORMAL LOW (ref 3.5–5.0)
Alkaline Phosphatase: 72 U/L (ref 38–126)
Bilirubin, Direct: 0.3 mg/dL — ABNORMAL HIGH (ref 0.0–0.2)
Indirect Bilirubin: 0.9 mg/dL (ref 0.3–0.9)
Total Bilirubin: 1.2 mg/dL (ref 0.3–1.2)
Total Protein: 4.5 g/dL — ABNORMAL LOW (ref 6.5–8.1)

## 2020-10-02 LAB — TROPONIN I (HIGH SENSITIVITY)
Troponin I (High Sensitivity): 1264 ng/L (ref <18)
Troponin I (High Sensitivity): 960 ng/L (ref <18)

## 2020-10-02 LAB — CBC
HCT: 27.8 % — ABNORMAL LOW (ref 39.0–52.0)
Hemoglobin: 8.3 g/dL — ABNORMAL LOW (ref 13.0–17.0)
MCH: 29.7 pg (ref 26.0–34.0)
MCHC: 29.9 g/dL — ABNORMAL LOW (ref 30.0–36.0)
MCV: 99.6 fL (ref 80.0–100.0)
Platelets: 121 10*3/uL — ABNORMAL LOW (ref 150–400)
RBC: 2.79 MIL/uL — ABNORMAL LOW (ref 4.22–5.81)
RDW: 16.3 % — ABNORMAL HIGH (ref 11.5–15.5)
WBC: 9.8 10*3/uL (ref 4.0–10.5)
nRBC: 0 % (ref 0.0–0.2)

## 2020-10-02 LAB — URINE CULTURE: Culture: NO GROWTH

## 2020-10-02 LAB — PHOSPHORUS
Phosphorus: 3.9 mg/dL (ref 2.5–4.6)
Phosphorus: 4 mg/dL (ref 2.5–4.6)

## 2020-10-02 LAB — GLUCOSE, CAPILLARY
Glucose-Capillary: 162 mg/dL — ABNORMAL HIGH (ref 70–99)
Glucose-Capillary: 170 mg/dL — ABNORMAL HIGH (ref 70–99)
Glucose-Capillary: 175 mg/dL — ABNORMAL HIGH (ref 70–99)
Glucose-Capillary: 225 mg/dL — ABNORMAL HIGH (ref 70–99)
Glucose-Capillary: 231 mg/dL — ABNORMAL HIGH (ref 70–99)
Glucose-Capillary: 232 mg/dL — ABNORMAL HIGH (ref 70–99)

## 2020-10-02 LAB — HEMOGLOBIN A1C
Hgb A1c MFr Bld: 6.1 % — ABNORMAL HIGH (ref 4.8–5.6)
Mean Plasma Glucose: 128.37 mg/dL

## 2020-10-02 MED ORDER — NOREPINEPHRINE 4 MG/250ML-% IV SOLN
0.0000 ug/min | INTRAVENOUS | Status: DC
  Administered 2020-10-02 (×2): 40 ug/min via INTRAVENOUS
  Administered 2020-10-02: 2 ug/min via INTRAVENOUS
  Administered 2020-10-02: 40 ug/min via INTRAVENOUS
  Filled 2020-10-02 (×4): qty 250

## 2020-10-02 MED ORDER — VITAL AF 1.2 CAL PO LIQD
1000.0000 mL | ORAL | Status: DC
  Administered 2020-10-02 – 2020-10-08 (×11): 1000 mL

## 2020-10-02 MED ORDER — PROSOURCE TF PO LIQD: 45.0000 mL | Freq: Two times a day (BID) | ORAL | Status: DC

## 2020-10-02 MED ORDER — SODIUM BICARBONATE 650 MG PO TABS
650.0000 mg | ORAL_TABLET | Freq: Three times a day (TID) | ORAL | Status: DC
  Administered 2020-10-02 – 2020-10-06 (×12): 650 mg
  Filled 2020-10-02 (×12): qty 1

## 2020-10-02 MED ORDER — VASOPRESSIN 20 UNITS/100 ML INFUSION FOR SHOCK
0.0300 [IU]/min | INTRAVENOUS | Status: DC
  Administered 2020-10-02 – 2020-10-03 (×5): 0.03 [IU]/min via INTRAVENOUS
  Administered 2020-10-04: 0.02 [IU]/min via INTRAVENOUS
  Filled 2020-10-02 (×5): qty 100

## 2020-10-02 MED ORDER — MIDAZOLAM HCL 2 MG/2ML IJ SOLN
2.0000 mg | INTRAMUSCULAR | Status: DC | PRN
  Administered 2020-10-07 – 2020-10-08 (×3): 2 mg via INTRAVENOUS
  Filled 2020-10-02 (×3): qty 2

## 2020-10-02 MED ORDER — MAGNESIUM SULFATE 2 GM/50ML IV SOLN
2.0000 g | Freq: Once | INTRAVENOUS | Status: AC
  Administered 2020-10-02: 2 g via INTRAVENOUS
  Filled 2020-10-02: qty 50

## 2020-10-02 MED ORDER — HYDROCORTISONE NA SUCCINATE PF 100 MG IJ SOLR
50.0000 mg | Freq: Four times a day (QID) | INTRAMUSCULAR | Status: DC
  Administered 2020-10-02 – 2020-10-06 (×16): 50 mg via INTRAVENOUS
  Filled 2020-10-02 (×16): qty 2

## 2020-10-02 MED ORDER — VITAL HIGH PROTEIN PO LIQD: 1000.0000 mL | ORAL | Status: DC

## 2020-10-02 MED ORDER — SODIUM CHLORIDE 0.9 % IV SOLN
1.0000 g | INTRAVENOUS | Status: DC
  Administered 2020-10-02 – 2020-10-03 (×2): 1 g via INTRAVENOUS
  Filled 2020-10-02: qty 10
  Filled 2020-10-02: qty 1

## 2020-10-02 MED ORDER — NOREPINEPHRINE 16 MG/250ML-% IV SOLN
0.0000 ug/min | INTRAVENOUS | Status: DC
  Administered 2020-10-02: 40 ug/min via INTRAVENOUS
  Administered 2020-10-03: 22 ug/min via INTRAVENOUS
  Administered 2020-10-03: 34 ug/min via INTRAVENOUS
  Administered 2020-10-03: 24 ug/min via INTRAVENOUS
  Filled 2020-10-02 (×4): qty 250

## 2020-10-02 MED ORDER — SODIUM CHLORIDE 0.9% FLUSH
10.0000 mL | Freq: Three times a day (TID) | INTRAVENOUS | Status: DC
  Administered 2020-10-03 – 2020-10-14 (×25): 10 mL

## 2020-10-02 MED ORDER — PHENYLEPHRINE HCL-NACL 10-0.9 MG/250ML-% IV SOLN
0.0000 ug/min | INTRAVENOUS | Status: DC
  Administered 2020-10-02: 20 ug/min via INTRAVENOUS
  Filled 2020-10-02: qty 250

## 2020-10-02 MED ORDER — INSULIN ASPART 100 UNIT/ML ~~LOC~~ SOLN
0.0000 [IU] | SUBCUTANEOUS | Status: DC
  Administered 2020-10-02 – 2020-10-03 (×4): 3 [IU] via SUBCUTANEOUS

## 2020-10-02 MED ORDER — LACTATED RINGERS IV BOLUS
1000.0000 mL | Freq: Once | INTRAVENOUS | Status: AC
  Administered 2020-10-02: 1000 mL via INTRAVENOUS

## 2020-10-02 NOTE — Progress Notes (Signed)
Dr. Silas Flood made aware unable to obtain ABG specimen. VBG sample ran and results given to MD. No new orders at this time. RT will continue to monitor and be available as needed.

## 2020-10-02 NOTE — Progress Notes (Signed)
°  Echocardiogram 2D Echocardiogram has been performed.  Matilde Bash 10/02/2020, 9:01 AM

## 2020-10-02 NOTE — Progress Notes (Addendum)
[facility] Progress Note Patient Name: Macsen Nuttall DOB: April 30, 1946 MRN: [id number]   Date of Service  10/02/2020  HPI/Events of Note  KUB reviewed for OGT placement.  eICU Interventions  Ok to use OGT.     Intervention Category Intermediate Interventions: Coagulopathy - evaluation and management  Elsie Lincoln 10/02/2020, 9:52 PM   5:34 AM Notified of K 5.3.  Crea continues to rise now at 4.3 <-- 2.76.  Plan> Will defer to Renal.   Reviewed medications and IVFs.  Please hold LR.  Last IVF bolus was from 10/02/2020.

## 2020-10-02 NOTE — Progress Notes (Signed)
[facility] Progress Note Patient Name: [name] DOB: 1946-09-08 MRN: [id number]   Date of Service  10/02/2020  HPI/Events of Note  Hypomagnesemia - Mg++ = 1.5 and Creatinine = 2.76.  eICU Interventions  Will replace Mg++.      Intervention Category Major Interventions: Electrolyte abnormality - evaluation and management  Dorsel Flinn Eugene 10/02/2020, 2:55 AM

## 2020-10-02 NOTE — Consult Note (Addendum)
[facility] Renal Consultation Note    Requesting MD: Hunsucker Indication for Consultation: A on CRF in transplant patient  HPI:  [name] is a 75 y.o. male with past medical history significant for hypertension, HLD, type II DM, CHF, A. Fib, PAD and gout ESRD status post kidney transplant in 1983, 2005- [facility]-  Standard criteria DD, CKD 4 with baseline creatinine level around 2..0-2.2, s/p hosp in September 2021 and December 2021- in December left foot infection, osteomyelitis,  had AKI on CKD that was CRRT requiring.  in 05/2020 for sepsis when he developed AKI with peak creatinine level of 2.73. He follows with Dr. Marval Regal at [facility] before hosp in December was around 2.  In late December 2021 crt 2.2 to 2.4 after requiring CRRT from 12/15- 12/17 in hosp.  Was discharged on 12/23 on mycophenolate 500 BID and prograf 1 mg BID.   He now presents with out of hospital cardiac arrest- wife found him unresponsive in bed on 1/12, pulseless- EMS performed 12 minutes of ACLS.  Since hosp has been unresponsive- febrile and has become more hemodynamically unstable on norepi.  Neurology has seen- EEG profound diffuse encephalopathy.  We are called for some worsening of renal insufficiency and to help assist IS meds.  UOP was good until the last 12 hours-  Now oliguric /anuric  Creatinine, Ser  Date/Time Value Ref Range Status  10/02/2020 01:36 AM 2.76 (H) 0.61 - 1.24 mg/dL Final  09/29/2020 12:22 PM 2.20 (H) 0.61 - 1.24 mg/dL Final  10/17/2020 12:08 PM 2.27 (H) 0.61 - 1.24 mg/dL Final   Pt has another chart [id number]  See for historic details  PMHx:  No past medical history on file.    Family Hx: No family history on file.  Social History:  has no history on file for tobacco use, alcohol use, and drug use.  Allergies:  Allergies  Allergen Reactions   Amitriptyline     Hallucinations     Medications: Prior to Admission medications   Medication Sig  Start Date End Date Taking? Authorizing Provider  acetaminophen (TYLENOL) 500 MG tablet Take 500 mg by mouth every 6 (six) hours as needed.   Yes [provider]  allopurinol (ZYLOPRIM) 100 MG tablet Take 100 mg by mouth daily.   Yes [provider]  aspirin EC 81 MG tablet Take 81 mg by mouth in the morning, at noon, in the evening, and at bedtime. Swallow whole.   Yes [provider]  atorvastatin (LIPITOR) 10 MG tablet Take 10 mg by mouth daily.   Yes [provider]  calcitRIOL (ROCALTROL) 0.25 MCG capsule Take 0.25 mcg by mouth daily.   Yes [provider]  carvedilol (COREG) 6.25 MG tablet Take 6.25 mg by mouth 2 (two) times daily with a meal.   Yes [provider]  ferrous sulfate 325 (65 FE) MG tablet Take 325 mg by mouth daily with breakfast.   Yes [provider]  furosemide (LASIX) 40 MG tablet Take 40 mg by mouth.   Yes [provider]  gabapentin (NEURONTIN) 100 MG capsule Take 100 mg by mouth 3 (three) times daily as needed (For pain).   Yes [provider]  hydrALAZINE (APRESOLINE) 25 MG tablet Take 25 mg by mouth 3 (three) times daily.   Yes [provider]  icosapent Ethyl (VASCEPA) 1 g capsule Take 2 g by mouth 2 (two) times daily.   Yes [provider]  insulin  glargine (LANTUS) 100 UNIT/ML injection Inject 7-15 Units into the skin daily.   Yes [provider]  insulin lispro (HUMALOG) 100 UNIT/ML injection Inject 3-5 Units into the skin 3 (three) times daily before meals.   Yes [provider]  latanoprost (XALATAN) 0.005 % ophthalmic solution Place 1 drop into both eyes at bedtime.   Yes [provider]  linagliptin (TRADJENTA) 5 MG TABS tablet Take 5 mg by mouth daily.   Yes [provider]  Multiple Vitamin (MULTIVITAMIN WITH MINERALS) TABS tablet Take 1 tablet by mouth daily.   Yes [provider]  mycophenolate (CELLCEPT) 500 MG  tablet Take 500 mg by mouth 2 (two) times daily.   Yes [provider]  predniSONE (DELTASONE) 5 MG tablet Take 5 mg by mouth daily with breakfast.   Yes [provider]  sodium bicarbonate 650 MG tablet Take 650 mg by mouth 3 (three) times daily.   Yes [provider]  tacrolimus (PROGRAF) 1 MG capsule Take 1 mg by mouth 2 (two) times daily.   Yes [provider]    I have reviewed the patient's current medications.  Labs:  Results for orders placed or performed during the hospital encounter of 09/22/2020 (from the past 48 hour(s))  Resp panel by RT-PCR (RSV, Flu A&B, Covid) Nasopharyngeal Swab     Status: None   Collection Time: 10/18/2020 11:01 AM   Specimen: Nasopharyngeal Swab; Nasopharyngeal(NP) swabs in vial transport medium  Result Value Ref Range   SARS Coronavirus 2 by RT PCR NEGATIVE NEGATIVE    Comment: (NOTE) SARS-CoV-2 target nucleic acids are NOT DETECTED.  The SARS-CoV-2 RNA is generally detectable in upper respiratory specimens during the acute phase of infection. The lowest concentration of SARS-CoV-2 viral copies this assay can detect is 138 copies/mL. A negative result does not preclude SARS-Cov-2 infection and should not be used as the sole basis for treatment or other patient management decisions. A negative result may occur with  improper specimen collection/handling, submission of specimen other than nasopharyngeal swab, presence of viral mutation(s) within the areas targeted by this assay, and inadequate number of viral copies(<138 copies/mL). A negative result must be combined with clinical observations, patient history, and epidemiological information. The expected result is Negative.  Fact Sheet for Patients:  EntrepreneurPulse.com.au  Fact Sheet for Healthcare Providers:  IncredibleEmployment.be  This test is no t yet approved or cleared by the [place] FDA and  has been  authorized for detection and/or diagnosis of SARS-CoV-2 by FDA under an Emergency Use Authorization (EUA). This EUA will remain  in effect (meaning this test can be used) for the duration of the COVID-19 declaration under Section 564(b)(1) of the Act, 21 U.S.C.section 360bbb-3(b)(1), unless the authorization is terminated  or revoked sooner.       Influenza A by PCR NEGATIVE NEGATIVE   Influenza B by PCR NEGATIVE NEGATIVE    Comment: (NOTE) The Xpert Xpress SARS-CoV-2/FLU/RSV plus assay is intended as an aid in the diagnosis of influenza from Nasopharyngeal swab specimens and should not be used as a sole basis for treatment. Nasal washings and aspirates are unacceptable for Xpert Xpress SARS-CoV-2/FLU/RSV testing.  Fact Sheet for Patients: EntrepreneurPulse.com.au  Fact Sheet for Healthcare Providers: IncredibleEmployment.be  This test is not yet approved or cleared by the [place] FDA and has been authorized for detection and/or diagnosis of SARS-CoV-2 by FDA under an Emergency Use Authorization (EUA). This EUA will remain in effect (meaning this test can be used)  for the duration of the COVID-19 declaration under Section 564(b)(1) of the Act, 21 U.S.C. section 360bbb-3(b)(1), unless the authorization is terminated or revoked.     Resp Syncytial Virus by PCR NEGATIVE NEGATIVE    Comment: (NOTE) Fact Sheet for Patients: EntrepreneurPulse.com.au  Fact Sheet for Healthcare Providers: IncredibleEmployment.be  This test is not yet approved or cleared by the [place] FDA and has been authorized for detection and/or diagnosis of SARS-CoV-2 by FDA under an Emergency Use Authorization (EUA). This EUA will remain in effect (meaning this test can be used) for the duration of the COVID-19 declaration under Section 564(b)(1) of the Act, 21 U.S.C. section 360bbb-3(b)(1), unless the authorization is  terminated or revoked.  Performed at [facility], [place] [address] [place], [place] [address]   Lactic acid, plasma     Status: Abnormal   Collection Time: 10/11/2020 12:05 PM  Result Value Ref Range   Lactic Acid, Venous 2.1 (HH) 0.5 - 1.9 mmol/L    Comment: CRITICAL RESULT CALLED TO, READ BACK BY AND VERIFIED WITH: K COBB RN 1301 [id number] BY A BENNETT Performed at [facility], [place] [address] [place], [place] [address]   Troponin I (High Sensitivity)     Status: Abnormal   Collection Time: 10/11/2020 12:05 PM  Result Value Ref Range   Troponin I (High Sensitivity) 114 (HH) <18 ng/L    Comment: CRITICAL RESULT CALLED TO, READ BACK BY AND VERIFIED WITH: LYON,J RN @1347  ON [id number] BY FLEMINGS (NOTE) Elevated high sensitivity troponin I (hsTnI) values and significant  changes across serial measurements may suggest ACS but many other  chronic and acute conditions are known to elevate hsTnI results.  Refer to the Links section for chest pain algorithms and additional  guidance. Performed at [facility], [place] [address], [place], [place] [address]   Comprehensive metabolic panel     Status: Abnormal   Collection Time: 10/09/2020 12:08 PM  Result Value Ref Range   Sodium 147 (H) 135 - 145 mmol/L   Potassium 5.0 3.5 - 5.1 mmol/L   Chloride 114 (H) 98 - 111 mmol/L   CO2 22 22 - 32 mmol/L   Glucose, Bld 94 70 - 99 mg/dL    Comment: Glucose reference range applies only to samples taken after fasting for at least 8 hours.   BUN 40 (H) 8 - 23 mg/dL   Creatinine, Ser 2.27 (H) 0.61 - 1.24 mg/dL   Calcium 9.9 8.9 - 10.3 mg/dL   Total Protein 5.3 (L) 6.5 - 8.1 g/dL   Albumin 2.9 (L) 3.5 - 5.0 g/dL   AST 50 (H) 15 - 41 U/L   ALT 22 0 - 44 U/L   Alkaline Phosphatase 93 38 - 126 U/L   Total Bilirubin 0.6 0.3 - 1.2 mg/dL   GFR, Estimated 30 (L) >60 mL/min    Comment: (NOTE) Calculated using the CKD-EPI Creatinine Equation (2021)    Anion gap 11 5 - 15    Comment: Performed  at [facility], [place] [address] [place], [place] [address]  CBC     Status: Abnormal   Collection Time: 09/27/2020 12:08 PM  Result Value Ref Range   WBC 7.8 4.0 - 10.5 K/uL   RBC 2.92 (L) 4.22 - 5.81 MIL/uL   Hemoglobin 8.7 (L) 13.0 - 17.0 g/dL   HCT 29.6 (L) 39.0 - 52.0 %   MCV 101.4 (H) 80.0 - 100.0 fL   MCH 29.8 26.0 -  34.0 pg   MCHC 29.4 (L) 30.0 - 36.0 g/dL   RDW 16.2 (H) 11.5 - 15.5 %   Platelets 139 (L) 150 - 400 K/uL   nRBC 0.0 0.0 - 0.2 %    Comment: Performed at [facility] [address] [place], [place] [address]  Protime-INR     Status: None   Collection Time: 09/29/2020 12:08 PM  Result Value Ref Range   Prothrombin Time 14.8 11.4 - 15.2 seconds   INR 1.2 0.8 - 1.2    Comment: (NOTE) INR goal varies based on device and disease states. Performed at [facility], [place] [address] [place], [place] [address]   Brain natriuretic peptide     Status: Abnormal   Collection Time: 10/07/2020 12:08 PM  Result Value Ref Range   B Natriuretic Peptide 325.7 (H) 0.0 - 100.0 pg/mL    Comment: Performed at [facility] [address], [place], [place] [address]  Sample to Blood Bank     Status: None   Collection Time: 10/08/2020 12:10 PM  Result Value Ref Range   Blood Bank Specimen SAMPLE AVAILABLE FOR TESTING    Sample Expiration      10/02/2020,2359 Performed at [facility], [place] [address], [place], [place] [address]   I-Stat arterial blood gas, [facility] ED)     Status: Abnormal   Collection Time: 09/30/2020 12:18 PM  Result Value Ref Range   pH, Arterial 7.298 (L) 7.350 - 7.450   pCO2 arterial 49.4 (H) 32.0 - 48.0 mmHg   pO2, Arterial 85 83.0 - 108.0 mmHg   Bicarbonate 24.2 20.0 - 28.0 mmol/L   TCO2 26 22 - 32 mmol/L   O2 Saturation 95.0 %   Acid-base deficit 2.0 0.0 - 2.0 mmol/L   Sodium 146 (H) 135 - 145 mmol/L   Potassium 5.3 (H) 3.5 - 5.1 mmol/L   Calcium, Ion 1.44 (H) 1.15 - 1.40 mmol/L   HCT 25.0 (L) 39.0 - 52.0 %   Hemoglobin 8.5 (L)  13.0 - 17.0 g/dL   Sample type ARTERIAL   I-Stat Chem 8, ED     Status: Abnormal   Collection Time: 09/23/2020 12:22 PM  Result Value Ref Range   Sodium 147 (H) 135 - 145 mmol/L   Potassium 5.0 3.5 - 5.1 mmol/L   Chloride 114 (H) 98 - 111 mmol/L   BUN 41 (H) 8 - 23 mg/dL   Creatinine, Ser 2.20 (H) 0.61 - 1.24 mg/dL   Glucose, Bld 84 70 - 99 mg/dL    Comment: Glucose reference range applies only to samples taken after fasting for at least 8 hours.   Calcium, Ion 1.45 (H) 1.15 - 1.40 mmol/L   TCO2 23 22 - 32 mmol/L   Hemoglobin 9.2 (L) 13.0 - 17.0 g/dL   HCT 27.0 (L) 39.0 - 52.0 %  Glucose, capillary     Status: Abnormal   Collection Time: 10/05/2020  3:25 PM  Result Value Ref Range   Glucose-Capillary 44 (LL) 70 - 99 mg/dL    Comment: Glucose reference range applies only to samples taken after fasting for at least 8 hours.   Comment 1 Notify RN   Glucose, capillary     Status: None   Collection Time: 09/25/2020  4:35 PM  Result Value Ref Range   Glucose-Capillary 79 70 - 99 mg/dL    Comment: Glucose reference range applies only to samples taken after fasting for at least 8 hours.  Urinalysis, Routine w reflex microscopic     Status: Abnormal   Collection Time: 09/26/2020  4:45 PM  Result Value Ref Range   Color, Urine YELLOW YELLOW   APPearance CLEAR CLEAR   Specific Gravity, Urine 1.015 1.005 - 1.030   pH 5.0 5.0 - 8.0   Glucose, UA NEGATIVE NEGATIVE mg/dL   Hgb urine dipstick NEGATIVE NEGATIVE   Bilirubin Urine NEGATIVE NEGATIVE   Ketones, ur NEGATIVE NEGATIVE mg/dL   Protein, ur 100 (A) NEGATIVE mg/dL   Nitrite NEGATIVE NEGATIVE   Leukocytes,Ua NEGATIVE NEGATIVE   RBC / HPF 0-5 0 - 5 RBC/hpf   WBC, UA 0-5 0 - 5 WBC/hpf   Bacteria, UA NONE SEEN NONE SEEN   Squamous Epithelial / LPF 0-5 0 - 5   Mucus PRESENT     Comment: Performed at [facility] [address], [place], [place] [address]  Culture, Urine     Status: None   Collection Time: 09/22/2020  5:44 PM   Specimen:  Urine, Catheterized  Result Value Ref Range   Specimen Description URINE, CATHETERIZED    Special Requests NONE    Culture      NO GROWTH Performed at [facility], [place] [address] [place], [place] [address]    Report Status 10/02/2020 FINAL   Ethanol     Status: None   Collection Time: 10/09/2020  5:51 PM  Result Value Ref Range   Alcohol, Ethyl (B) <10 <10 mg/dL    Comment: (NOTE) Lowest detectable limit for serum alcohol is 10 mg/dL.  For medical purposes only. Performed at [facility], [place] [address], [place], [place] [address]   CBC     Status: Abnormal   Collection Time: 09/28/2020  5:51 PM  Result Value Ref Range   WBC 7.2 4.0 - 10.5 K/uL   RBC 3.08 (L) 4.22 - 5.81 MIL/uL   Hemoglobin 8.9 (L) 13.0 - 17.0 g/dL   HCT 30.3 (L) 39.0 - 52.0 %   MCV 98.4 80.0 - 100.0 fL   MCH 28.9 26.0 - 34.0 pg   MCHC 29.4 (L) 30.0 - 36.0 g/dL   RDW 16.2 (H) 11.5 - 15.5 %   Platelets 139 (L) 150 - 400 K/uL   nRBC 0.0 0.0 - 0.2 %    Comment: Performed at [facility] [address], [place], [place] [address]  Troponin I (High Sensitivity)     Status: Abnormal   Collection Time: 10/10/2020  5:51 PM  Result Value Ref Range   Troponin I (High Sensitivity) 1,098 (HH) <18 ng/L    Comment: CRITICAL VALUE NOTED.  VALUE IS CONSISTENT WITH PREVIOUSLY REPORTED AND CALLED VALUE. (NOTE) Elevated high sensitivity troponin I (hsTnI) values and significant  changes across serial measurements may suggest ACS but many other  chronic and acute conditions are known to elevate hsTnI results.  Refer to the Links section for chest pain algorithms and additional  guidance. Performed at [facility], [place] [address], [place], [place] [address]   MRSA PCR Screening     Status: None   Collection Time: 10/12/2020  6:01 PM   Specimen: Nasopharyngeal  Result Value Ref Range   MRSA by PCR NEGATIVE NEGATIVE    Comment:        The GeneXpert MRSA Assay (FDA approved for NASAL  specimens only), is one component of a comprehensive MRSA colonization surveillance program. It is not intended to diagnose MRSA infection nor to  guide or monitor treatment for MRSA infections. Performed at [facility], [place] [address], [place], [place] [address]   Glucose, capillary     Status: Abnormal   Collection Time: 09/25/2020  7:29 PM  Result Value Ref Range   Glucose-Capillary 56 (L) 70 - 99 mg/dL    Comment: Glucose reference range applies only to samples taken after fasting for at least 8 hours.  Glucose, capillary     Status: Abnormal   Collection Time: 10/20/2020  8:03 PM  Result Value Ref Range   Glucose-Capillary 126 (H) 70 - 99 mg/dL    Comment: Glucose reference range applies only to samples taken after fasting for at least 8 hours.  Glucose, capillary     Status: Abnormal   Collection Time: 09/24/2020 11:26 PM  Result Value Ref Range   Glucose-Capillary 147 (H) 70 - 99 mg/dL    Comment: Glucose reference range applies only to samples taken after fasting for at least 8 hours.  Basic metabolic panel     Status: Abnormal   Collection Time: 10/02/20  1:36 AM  Result Value Ref Range   Sodium 145 135 - 145 mmol/L   Potassium 4.9 3.5 - 5.1 mmol/L   Chloride 115 (H) 98 - 111 mmol/L   CO2 15 (L) 22 - 32 mmol/L   Glucose, Bld 168 (H) 70 - 99 mg/dL    Comment: Glucose reference range applies only to samples taken after fasting for at least 8 hours.   BUN 45 (H) 8 - 23 mg/dL   Creatinine, Ser 2.76 (H) 0.61 - 1.24 mg/dL   Calcium 9.3 8.9 - 10.3 mg/dL   GFR, Estimated 24 (L) >60 mL/min    Comment: (NOTE) Calculated using the CKD-EPI Creatinine Equation (2021)    Anion gap 15 5 - 15    Comment: Performed at [facility] [address], [place], [place] [address]  Magnesium     Status: Abnormal   Collection Time: 10/02/20  1:36 AM  Result Value Ref Range   Magnesium 1.5 (L) 1.7 - 2.4 mg/dL    Comment: Performed at [facility] [address]  [place], [place] [address]  CBC     Status: Abnormal   Collection Time: 10/02/20  1:36 AM  Result Value Ref Range   WBC 9.8 4.0 - 10.5 K/uL   RBC 2.79 (L) 4.22 - 5.81 MIL/uL   Hemoglobin 8.3 (L) 13.0 - 17.0 g/dL   HCT 27.8 (L) 39.0 - 52.0 %   MCV 99.6 80.0 - 100.0 fL   MCH 29.7 26.0 - 34.0 pg   MCHC 29.9 (L) 30.0 - 36.0 g/dL   RDW 16.3 (H) 11.5 - 15.5 %   Platelets 121 (L) 150 - 400 K/uL   nRBC 0.0 0.0 - 0.2 %    Comment: Performed at [facility] [address] [place], [place] [address]  Glucose, capillary     Status: Abnormal   Collection Time: 10/02/20  3:26 AM  Result Value Ref Range   Glucose-Capillary 162 (H) 70 - 99 mg/dL    Comment: Glucose reference range applies only to samples taken after fasting for at least 8 hours.  Glucose, capillary     Status: Abnormal   Collection Time: 10/02/20  7:39 AM  Result Value Ref Range   Glucose-Capillary 175 (H) 70 - 99 mg/dL    Comment: Glucose reference range applies only to samples taken after fasting for at least 8  hours.  Troponin I (High Sensitivity)     Status: Abnormal   Collection Time: 10/02/20  9:30 AM  Result Value Ref Range   Troponin I (High Sensitivity) 1,264 (HH) <18 ng/L    Comment: CRITICAL VALUE NOTED.  VALUE IS CONSISTENT WITH PREVIOUSLY REPORTED AND CALLED VALUE. (NOTE) Elevated high sensitivity troponin I (hsTnI) values and significant  changes across serial measurements may suggest ACS but many other  chronic and acute conditions are known to elevate hsTnI results.  Refer to the Links section for chest pain algorithms and additional  guidance. Performed at [facility], [place] [address] [place], [place] [address]   Hepatic function panel     Status: Abnormal   Collection Time: 10/02/20  9:30 AM  Result Value Ref Range   Total Protein 4.5 (L) 6.5 - 8.1 g/dL   Albumin 2.4 (L) 3.5 - 5.0 g/dL   AST 48 (H) 15 - 41 U/L   ALT 22 0 - 44 U/L   Alkaline Phosphatase 72 38 - 126 U/L   Total Bilirubin 1.2  0.3 - 1.2 mg/dL   Bilirubin, Direct 0.3 (H) 0.0 - 0.2 mg/dL   Indirect Bilirubin 0.9 0.3 - 0.9 mg/dL    Comment: Performed at [facility] [address], [place], [place] [address]  Magnesium     Status: None   Collection Time: 10/02/20  9:57 AM  Result Value Ref Range   Magnesium 2.0 1.7 - 2.4 mg/dL    Comment: Performed at [facility] [address] [place], [place] [address]  Phosphorus     Status: None   Collection Time: 10/02/20  9:57 AM  Result Value Ref Range   Phosphorus 3.9 2.5 - 4.6 mg/dL    Comment: Performed at [facility] [address] [place], [place] [address]  Glucose, capillary     Status: Abnormal   Collection Time: 10/02/20 11:38 AM  Result Value Ref Range   Glucose-Capillary 170 (H) 70 - 99 mg/dL    Comment: Glucose reference range applies only to samples taken after fasting for at least 8 hours.     ROS:  Review of systems not obtained due to patient factors.  Physical Exam: Vitals:   10/02/20 1230 10/02/20 1300  BP: (!) 105/48 (!) 110/44  Pulse: 94 86  Resp: (!) 22 (!) 22  Temp: 98.42 F (36.9 C) 98.6 F (37 C)  SpO2: 98% 98%     General: facial contusion with laceration- stiches-  Injected left eye-  Opens eyes to much stimuli-  No commands HEENT: as above-  Injected left eye conjuctiva Neck: no JVD Heart: RRR Lungs: dec BS at bases Abdomen: distended-  Not obviously tender-  abd wall edema  Extremities: pitting edema-  Evidence of venous stasis Skin: multiple excoriations/evidence of prior skin wounds Neuro: unresponsive-  Will open eyes with much stimuli  Assessment/Plan: 75 year old BM with multiple medical issues including s/p renal transplant and stage 4 CKD-  This is 3rd hosp in the last 5 months- now with out of hosp cardiac arrest  1.Renal- s/p second renal transplant at [facility] in 2005 on IS- but also baseline CKD-  crt in the low 2's.  Now with A on CRF likely due to ATN from cardiac arrest/hypotension and  poor renal perfusion.  No absolute need for dialytic support right now but if hemodynamics and UOP dont improve in the next 24- 48 hours would maybe ? need  CRRT.  Not sure of the utility of super aggressive care with CRRT as prognosis for neurologic recover I would think poor and patient is very chronically ill at baseline.  Have informed his primary nephrologist Dr. C who might be useful in conversations with the family if this is the direction we are going in as he knows him best    2.  Metabolic acidosis-  With elevated lactate and continued hemodynamic instability.  Will not give bicarb as already seems overloaded 3. Hypertension/volume  - hypotensive req pressors-  Overloaded and likely third spacing given low albumin  4. Anemia  - situational-  Supportive care  5.  S/p renal transplant-  Is on home doses of cellcept, prograf and solumedrol- no changed recommended    Louis Meckel 10/02/2020, 1:33 PM

## 2020-10-02 NOTE — Progress Notes (Signed)
Initial Nutrition Assessment  DOCUMENTATION CODES:   Not applicable  INTERVENTION:   Initiate tube feeding via OG tube: Vital AF 1.2 at 75 ml/h (1800 ml per day)  Provides 2160 kcal, 135 gm protein, 1460 ml free water daily  NUTRITION DIAGNOSIS:   Inadequate oral intake related to inability to eat as evidenced by NPO status.  GOAL:   Patient will meet greater than or equal to 90% of their needs  MONITOR:   Vent status,TF tolerance,Skin,Labs  REASON FOR ASSESSMENT:   Ventilator,Consult Enteral/tube feeding initiation and management  ASSESSMENT:   75 yo male admitted after fall with PEA arrest S/P 12 minutes ACLS. PMH includes 2 renal transplants, CKD stage 4, CHF, HTN.   Discussed patient in ICU rounds and with RN today. Received MD Consult for TF initiation and management. OG tube in place.  Spoke with patient's daughter at bedside. She states that patient lost some weight when he was hospitalized recently and required HD for a few days. She thinks this is the reason for weight loss. She is unsure of his usual weight. He typically eats well according to daughter.  Patient is currently intubated on ventilator support MV: 13.4 L/min Temp (24hrs), Avg:100.8 F (38.2 C), Min:98.3 F (36.8 C), Max:102.74 F (39.3 C)   Labs reviewed. BUN 45, creatinine 2.76, mag 1.5 Phos 3.9 WNL, K 4.9 WNL CBG: 162-175-170  Medications reviewed and include Levophed. IVF: D10 at 40 ml/h   No recent weights available in EMR for review.   NUTRITION - FOCUSED PHYSICAL EXAM:  Flowsheet Row Most Recent Value  Orbital Region Unable to assess  Upper Arm Region No depletion  Thoracic and Lumbar Region No depletion  Buccal Region Unable to assess  Temple Region Mild depletion  Clavicle Bone Region Moderate depletion  Clavicle and Acromion Bone Region Moderate depletion  Scapular Bone Region Moderate depletion  Dorsal Hand Mild depletion  Patellar Region Mild depletion  Anterior  Thigh Region Mild depletion  Posterior Calf Region No depletion  Edema (RD Assessment) Mild  Hair Reviewed  Eyes Unable to assess  Mouth Unable to assess  Skin Reviewed  Nails Reviewed       Diet Order:   Diet Order            Diet NPO time specified  Diet effective now                 EDUCATION NEEDS:   Not appropriate for education at this time  Skin:  Skin Assessment: Skin Integrity Issues: Skin Integrity Issues:: Other (Comment) Other: partial thickness degloving injury to arm, abrasion/cut over L eye  Last BM:  no BM documented  Height:   Ht Readings from Last 1 Encounters:  09/21/2020 5\' 11"  (1.803 m)    Weight:   Wt Readings from Last 1 Encounters:  10/02/20 87.2 kg    Ideal Body Weight:  78.2 kg  BMI:  Body mass index is 26.81 kg/m.  Estimated Nutritional Needs:   Kcal:  2100-2300  Protein:  110-130 gm  Fluid:  2 L    Lucas Mallow, RD, LDN, CNSC Please refer to Amion for contact information.

## 2020-10-02 NOTE — Progress Notes (Signed)
NAME:  [name], MRN:  [id number], DOB:  1945-11-18, LOS: 1 ADMISSION DATE:  09/26/2020, CONSULTATION DATE:  1/12 REFERRING MD:  Dr. Rogene Houston, CHIEF COMPLAINT:  Cardiac arrest   Brief History:  75 year old male with renal transplant history admitted 1/12 after PEA arrest 12 minutes.  History of Present Illness:  75 year old male with PMH as below, which is significant for CKD (previously on HD, but is s/p renal transplant in 2005 at [facility]), AF not on [facility], who presented to [name] ED after cardiac arrest athome 1/12. Apparently 1/11 in the evening hours he developed chest pain and dyspnea, but proceeded to go to bed. Then 1/12 AM his wife awoke around 0930 and found him to be unresponsive and foaming at the mouth. He was still breathing. As she called EMS he fell to the floor. Upon EMS arrival the patient was pulseless and ACLS was initiated. CPR was performed for approximately 12 minutes with two doses of epinephrine. He suffered injury to his L periorbit at some point during all this, raising concern for head injury and he was treated as a level one trauma in ED. Upon arrival he was unresponsive and was immediately intubated. He was emergently taken for pan-CT scan. No operable injury was identified. PCCM asked to admit.   Past Medical History:   Paroxysmal AF (no AC)  ESRD S/p Renal transplant x2  HFrEF  HTN  HLD  T2DM  PAD  Significant Hospital Events:  1/12 admit for PEA arrest  Consults:  Neurosurgery Trauma surgery  Procedures:  CVL R subclavian 1/12 > ETT 1/12 >  Significant Diagnostic Tests:  1/12 CTA head > no large vessel occlusion. Mod-severe stenosis of the dominant R vert.  1/12 CT Maxillofacial > Subtle fracture of the posterior wall of the right maxillary sinus is new since September and likely acute. Trace fluid within the sinus. Hematoma/contusion involving the right parotid gland and regional subcutaneous tissues. Left forehead and periorbital  hematoma without underlying fracture. 1/12 CT chest/abd/pelv > Numerous acute rib fractures: Right ribs 1 through 7 (with rib 2 through 4 flail segment) and left ribs 2 through 7. Mild intercostal hematoma, but no pneumothorax, hemothorax or pulmonary contusion. Low-density layering pleural effusions appear mildly increased from last month. Chronic atelectasis.  Micro Data:  1/12 Urine Cultures > Pending 1/13 Blood Cultures > Pending   Antimicrobials:  Ceftriaxone 1/13 >>   Interim History / Subjective:   Overnight, patient was noted to be febrile up to 103 and remained febrile for most of the night. He developed hypoglycemia with need for D10 infusion. Worsening hypotension with new pressor requirement noted.   Objective   Blood pressure (!) 122/46, pulse 84, temperature (!) 100.6 F (38.1 C), resp. rate 12, height 5\' 11"  (1.803 m), weight 87.2 kg, SpO2 92 %.    Vent Mode: PRVC FiO2 (%):  [40 %-100 %] 40 % Set Rate:  [20 bmp-22 bmp] 22 bmp Vt Set:  [600 mL] 600 mL PEEP:  [5 cmH20] 5 cmH20 Plateau Pressure:  [23 cmH20-26 cmH20] 26 cmH20   Intake/Output Summary (Last 24 hours) at 10/02/2020 0920 Last data filed at 10/02/2020 0800 Gross per 24 hour  Intake 890.9 ml  Output 1450 ml  Net -559.1 ml   Filed Weights   10/02/2020 1142 10/02/20 0343  Weight: 86.2 kg 87.2 kg   Physical Exam Vitals and nursing note reviewed.  Constitutional:      Appearance: He is normal weight. He is  ill-appearing.     Interventions: He is sedated and intubated.  HENT:     Head: Contusion (left eye), left periorbital erythema and laceration (left eye, laceration has been sutured) present.  Eyes:     Conjunctiva/sclera:     Left eye: Hemorrhage present.     Pupils: Pupils are equal, round, and reactive to light.  Cardiovascular:     Rate and Rhythm: Normal rate and regular rhythm.     Heart sounds: Normal heart sounds.  Pulmonary:     Effort: He is intubated.     Breath sounds: No rhonchi or  rales.  Musculoskeletal:     Right lower leg: 2+ Pitting Edema present.     Left lower leg: 2+ Pitting Edema present.  Neurological:     Mental Status: He is lethargic.    Resolved Hospital Problem list     Assessment & Plan:   # PEA cardiac arrest: Witnessed, no bystander CPR. 12 min ACLS. Initially suspected to be cardiogenic in the setting of pulmonary edema. TTE today showed recovered EF with no new wall motion abnormalities to explain.  Troponin mildly elevated, however likely due to CPR and not ACS.  With some purposeful movements yesterday, although quite sedated this morning. TTM not pursued.  - Repeat Troponin pending   # Shock: Given fevers overnight, differential includes septic versus hypovolemic given diuresis.  TTE does show ventricular collapse supporting low end-diastolic volume. We will start fluid resuscitation and empiric antibiotics. If this is septic, source is likely pulmonary. No evidence of skin or soft tissue infection, including cellulitis although patient was recently hospitalized for this recently. - Levophed, titrate to maintain MAP above 65 - 1 L of lactated Ringer's bolus - Blood cultures pending - Start ceftriaxone - Trend fever curve - Hepatic function panel pending   # Acute hypoxic respiratory failure: Pulmonary edema with possible aspiration event during arrest.  - Full vent support. - Wean as mental status allows - Bronchial hygiene. - Follow CXR. - Starting Ceftriaxone   # CKD:  Hx renal transplant x 2 (1983, 2005 and now on Cellcept, Prograf, Prednisone). # Hypomagnesemia  - S/p Mag Sulfate 2 g  - Trend creatinine - Strict in/outs  - Continue cell-cept, prograf, prednisone - Daily Tac level  # A. Fib, Paroxysmal: Previous hx, not on AC. Patient has been converting with A. Fib and sinus throughout the night. No RVR noted. Given wounds from fall yesterday, will hold off on full anticoagulation - Monitor for RVR  # HFrEF with recovered EF:  LVEF 40-45% with grade 3 DD. Repeat TTE today shows improvement in EF however, evidence of severe LVH with cavity obliteration during systole.  - Hold further diuresis  - Hold GDMT   # T2DM - CBG monitoring  - SSI  # R, L rib fx: flail segments by CT on the right # R maxillary sinus wall fracture - Pain management - No indication for surgical intervention   Best practice (evaluated daily)  Diet: NPO Pain/Anxiety/Delirium protocol (if indicated): Fentanyl VAP protocol (if indicated): Per protocol DVT prophylaxis: SQH GI prophylaxis: Pepcid Glucose control: SSI Mobility: BR Disposition: FULL  Goals of Care:  Last date of multidisciplinary goals of care discussion: 1/12 Family and staff present: Wife via phone on 1/12 Summary of discussion: Full scope of tx Follow up goals of care discussion due: 1/20 Code Status: FULL code  Labs   CBC: Recent Labs  Lab 10/09/2020 1208 10/05/2020 1218 09/27/2020 1222 10/04/2020 1751 10/02/20  0136  WBC 7.8  --   --  7.2 9.8  HGB 8.7* 8.5* 9.2* 8.9* 8.3*  HCT 29.6* 25.0* 27.0* 30.3* 27.8*  MCV 101.4*  --   --  98.4 99.6  PLT 139*  --   --  139* 121*    Basic Metabolic Panel: Recent Labs  Lab 10/04/2020 1208 09/21/2020 1218 10/18/2020 1222 10/02/20 0136  NA 147* 146* 147* 145  K 5.0 5.3* 5.0 4.9  CL 114*  --  114* 115*  CO2 22  --   --  15*  GLUCOSE 94  --  84 168*  BUN 40*  --  41* 45*  CREATININE 2.27*  --  2.20* 2.76*  CALCIUM 9.9  --   --  9.3  MG  --   --   --  1.5*   GFR: Estimated Creatinine Clearance: 25.4 mL/min (A) (by C-G formula based on SCr of 2.76 mg/dL (H)). Recent Labs  Lab 09/28/2020 1205 10/06/2020 1208 10/17/2020 1751 10/02/20 0136  WBC  --  7.8 7.2 9.8  LATICACIDVEN 2.1*  --   --   --     Liver Function Tests: Recent Labs  Lab 10/18/2020 1208  AST 50*  ALT 22  ALKPHOS 93  BILITOT 0.6  PROT 5.3*  ALBUMIN 2.9*   No results for input(s): LIPASE, AMYLASE in the last 168 hours. No results for input(s): AMMONIA  in the last 168 hours.  ABG    Component Value Date/Time   PHART 7.298 (L) 09/25/2020 1218   PCO2ART 49.4 (H) 10/13/2020 1218   PO2ART 85 10/18/2020 1218   HCO3 24.2 10/13/2020 1218   TCO2 23 09/30/2020 1222   ACIDBASEDEF 2.0 10/07/2020 1218   O2SAT 95.0 09/26/2020 1218     Coagulation Profile: Recent Labs  Lab 10/18/2020 1208  INR 1.2    Cardiac Enzymes: No results for input(s): CKTOTAL, CKMB, CKMBINDEX, TROPONINI in the last 168 hours.  HbA1C: No results found for: HGBA1C  CBG: Recent Labs  Lab 09/20/2020 1929 09/22/2020 2003 10/02/2020 2326 10/02/20 0326 10/02/20 0739  GLUCAP 56* 126* 147* 162* 175*    Review of Systems:   Patient is encephalopathic and/or intubated. Therefore history has been obtained from chart review.   Past Medical History:  He,  has no past medical history on file.   Surgical History:    Social History:      Family History:  His family history is not on file.   Allergies Allergies  Allergen Reactions  . Amitriptyline     Hallucinations      Home Medications  Prior to Admission medications   Not on File     Dr. Jose Persia Internal Medicine PGY-2  10/02/2020, 1:38 PM

## 2020-10-02 NOTE — Procedures (Addendum)
Insertion of Chest Tube Procedure Note  Keonta Alsip  [id number]  11-10-1945  Date:10/02/20  Time:3:12 PM    Provider Performing: Johnsie Cancel   Procedure: Chest Tube Insertion [id number])  Indication(s) Pneumothorax  Consent Unable to obtain consent due to emergent nature of procedure.  Anesthesia Topical only with 1% lidocaine    Time Out Verified patient identification, verified procedure, site/side was marked, verified correct patient position, special equipment/implants available, medications/allergies/relevant history reviewed, required imaging and test results available.   Sterile Technique Maximal sterile technique including full sterile barrier drape, hand hygiene, sterile gown, sterile gloves, mask, hair covering, sterile ultrasound probe cover (if used).   Procedure Description Ultrasound used to identify appropriate pleural anatomy for placement and overlying skin marked. Area of placement cleaned and draped in sterile fashion.  A 14 French pigtail pleural catheter was placed into the right pleural space using Seldinger technique. Appropriate return of air was obtained.  The tube was connected to atrium and placed on -20 cm H2O wall suction.   Complications/Tolerance None; patient tolerated the procedure well. Chest X-ray is ordered to verify placement.   EBL Minimal  Specimen(s) none   Johnsie Cancel, NP-C [facility] Contact / Pager information can be found on Amion  10/02/2020, 3:14 PM

## 2020-10-03 ENCOUNTER — Inpatient Hospital Stay (HOSPITAL_COMMUNITY): Payer: Medicare Other

## 2020-10-03 DIAGNOSIS — J9601 Acute respiratory failure with hypoxia: Secondary | ICD-10-CM | POA: Diagnosis not present

## 2020-10-03 DIAGNOSIS — N170 Acute kidney failure with tubular necrosis: Secondary | ICD-10-CM

## 2020-10-03 DIAGNOSIS — N189 Chronic kidney disease, unspecified: Secondary | ICD-10-CM

## 2020-10-03 DIAGNOSIS — I469 Cardiac arrest, cause unspecified: Secondary | ICD-10-CM | POA: Diagnosis not present

## 2020-10-03 LAB — BASIC METABOLIC PANEL
Anion gap: 15 (ref 5–15)
BUN: 56 mg/dL — ABNORMAL HIGH (ref 8–23)
CO2: 16 mmol/L — ABNORMAL LOW (ref 22–32)
Calcium: 8.7 mg/dL — ABNORMAL LOW (ref 8.9–10.3)
Chloride: 109 mmol/L (ref 98–111)
Creatinine, Ser: 4.31 mg/dL — ABNORMAL HIGH (ref 0.61–1.24)
GFR, Estimated: 14 mL/min — ABNORMAL LOW (ref >60)
Glucose, Bld: 276 mg/dL — ABNORMAL HIGH (ref 70–99)
Potassium: 5.3 mmol/L — ABNORMAL HIGH (ref 3.5–5.1)
Sodium: 140 mmol/L (ref 135–145)

## 2020-10-03 LAB — GLUCOSE, CAPILLARY
Glucose-Capillary: 233 mg/dL — ABNORMAL HIGH (ref 70–99)
Glucose-Capillary: 236 mg/dL — ABNORMAL HIGH (ref 70–99)
Glucose-Capillary: 238 mg/dL — ABNORMAL HIGH (ref 70–99)
Glucose-Capillary: 241 mg/dL — ABNORMAL HIGH (ref 70–99)
Glucose-Capillary: 242 mg/dL — ABNORMAL HIGH (ref 70–99)
Glucose-Capillary: 280 mg/dL — ABNORMAL HIGH (ref 70–99)

## 2020-10-03 LAB — CBC
HCT: 25.5 % — ABNORMAL LOW (ref 39.0–52.0)
Hemoglobin: 7.8 g/dL — ABNORMAL LOW (ref 13.0–17.0)
MCH: 29.8 pg (ref 26.0–34.0)
MCHC: 30.6 g/dL (ref 30.0–36.0)
MCV: 97.3 fL (ref 80.0–100.0)
Platelets: 138 10*3/uL — ABNORMAL LOW (ref 150–400)
RBC: 2.62 MIL/uL — ABNORMAL LOW (ref 4.22–5.81)
RDW: 16.2 % — ABNORMAL HIGH (ref 11.5–15.5)
WBC: 15.9 10*3/uL — ABNORMAL HIGH (ref 4.0–10.5)
nRBC: 0 % (ref 0.0–0.2)

## 2020-10-03 LAB — COMPREHENSIVE METABOLIC PANEL
ALT: 30 U/L (ref 0–44)
AST: 56 U/L — ABNORMAL HIGH (ref 15–41)
Albumin: 2.2 g/dL — ABNORMAL LOW (ref 3.5–5.0)
Alkaline Phosphatase: 94 U/L (ref 38–126)
Anion gap: 18 — ABNORMAL HIGH (ref 5–15)
BUN: 56 mg/dL — ABNORMAL HIGH (ref 8–23)
CO2: 18 mmol/L — ABNORMAL LOW (ref 22–32)
Calcium: 7.9 mg/dL — ABNORMAL LOW (ref 8.9–10.3)
Chloride: 105 mmol/L (ref 98–111)
Creatinine, Ser: 4.23 mg/dL — ABNORMAL HIGH (ref 0.61–1.24)
GFR, Estimated: 14 mL/min — ABNORMAL LOW (ref >60)
Glucose, Bld: 243 mg/dL — ABNORMAL HIGH (ref 70–99)
Potassium: 5 mmol/L (ref 3.5–5.1)
Sodium: 141 mmol/L (ref 135–145)
Total Bilirubin: 0.8 mg/dL (ref 0.3–1.2)
Total Protein: 4.7 g/dL — ABNORMAL LOW (ref 6.5–8.1)

## 2020-10-03 LAB — MAGNESIUM
Magnesium: 1.8 mg/dL (ref 1.7–2.4)
Magnesium: 1.9 mg/dL (ref 1.7–2.4)

## 2020-10-03 LAB — PHOSPHORUS
Phosphorus: 4.9 mg/dL — ABNORMAL HIGH (ref 2.5–4.6)
Phosphorus: 5.6 mg/dL — ABNORMAL HIGH (ref 2.5–4.6)

## 2020-10-03 MED ORDER — SODIUM CHLORIDE 0.9 % IV SOLN
2.0000 g | INTRAVENOUS | Status: DC
  Administered 2020-10-03: 2 g via INTRAVENOUS
  Filled 2020-10-03: qty 2

## 2020-10-03 MED ORDER — SODIUM CHLORIDE 0.9 % IV SOLN
2.0000 g | Freq: Two times a day (BID) | INTRAVENOUS | Status: DC
  Administered 2020-10-04 – 2020-10-05 (×3): 2 g via INTRAVENOUS
  Filled 2020-10-03 (×3): qty 2

## 2020-10-03 MED ORDER — PRISMASOL BGK 0/2.5 32-2.5 MEQ/L EC SOLN
Status: AC
  Filled 2020-10-03 (×10): qty 5000

## 2020-10-03 MED ORDER — INSULIN GLARGINE 100 UNIT/ML ~~LOC~~ SOLN
10.0000 [IU] | Freq: Every day | SUBCUTANEOUS | Status: DC
  Administered 2020-10-03 – 2020-10-05 (×3): 10 [IU] via SUBCUTANEOUS
  Filled 2020-10-03 (×3): qty 0.1

## 2020-10-03 MED ORDER — SODIUM CHLORIDE 0.9 % IV SOLN
INTRAVENOUS | Status: DC | PRN
  Administered 2020-10-03: 10 mL/h via INTRAVENOUS

## 2020-10-03 MED ORDER — PRISMASOL BGK 0/2.5 32-2.5 MEQ/L EC SOLN
Status: AC
  Filled 2020-10-03 (×4): qty 5000

## 2020-10-03 MED ORDER — INSULIN ASPART 100 UNIT/ML ~~LOC~~ SOLN
0.0000 [IU] | SUBCUTANEOUS | Status: DC
  Administered 2020-10-03: 8 [IU] via SUBCUTANEOUS
  Administered 2020-10-03 (×3): 5 [IU] via SUBCUTANEOUS
  Administered 2020-10-04: 3 [IU] via SUBCUTANEOUS
  Administered 2020-10-04: 2 [IU] via SUBCUTANEOUS
  Administered 2020-10-04: 5 [IU] via SUBCUTANEOUS
  Administered 2020-10-04: 2 [IU] via SUBCUTANEOUS
  Administered 2020-10-04: 3 [IU] via SUBCUTANEOUS
  Administered 2020-10-05: 11 [IU] via SUBCUTANEOUS
  Administered 2020-10-05: 8 [IU] via SUBCUTANEOUS
  Administered 2020-10-05 (×2): 5 [IU] via SUBCUTANEOUS
  Administered 2020-10-05: 11 [IU] via SUBCUTANEOUS
  Administered 2020-10-06: 8 [IU] via SUBCUTANEOUS
  Administered 2020-10-06: 5 [IU] via SUBCUTANEOUS
  Administered 2020-10-06: 8 [IU] via SUBCUTANEOUS
  Administered 2020-10-06 (×2): 3 [IU] via SUBCUTANEOUS
  Administered 2020-10-06: 8 [IU] via SUBCUTANEOUS
  Administered 2020-10-07: 2 [IU] via SUBCUTANEOUS
  Administered 2020-10-07: 8 [IU] via SUBCUTANEOUS
  Administered 2020-10-07: 5 [IU] via SUBCUTANEOUS

## 2020-10-03 MED ORDER — PRISMASOL BGK 0/2.5 32-2.5 MEQ/L EC SOLN
Status: AC
  Filled 2020-10-03 (×2): qty 5000

## 2020-10-03 MED ORDER — HEPARIN SODIUM (PORCINE) 1000 UNIT/ML DIALYSIS
1000.0000 [IU] | INTRAMUSCULAR | Status: DC | PRN
  Administered 2020-10-03 – 2020-10-07 (×4): 1300 [IU] via INTRAVENOUS_CENTRAL
  Administered 2020-10-11: 2600 [IU] via INTRAVENOUS_CENTRAL
  Filled 2020-10-03: qty 6
  Filled 2020-10-03: qty 4
  Filled 2020-10-03: qty 1
  Filled 2020-10-03: qty 6
  Filled 2020-10-03: qty 2
  Filled 2020-10-03 (×4): qty 6
  Filled 2020-10-03: qty 3

## 2020-10-03 MED ORDER — GADOBUTROL 1 MMOL/ML IV SOLN
10.0000 mL | Freq: Once | INTRAVENOUS | Status: AC | PRN
  Administered 2020-10-03: 10 mL via INTRAVENOUS

## 2020-10-03 NOTE — Progress Notes (Signed)
NAME:  [name], MRN:  [id number], DOB:  [birth year], LOS: 2 ADMISSION DATE:  09/30/2020, CONSULTATION DATE:  1/12 REFERRING MD:  Dr. Rogene Houston, CHIEF COMPLAINT:  Cardiac arrest   Brief History:  75 year old male with renal transplant history admitted 1/12 after PEA arrest 12 minutes.  History of Present Illness:  75 year old male with PMH as below, which is significant for CKD (previously on HD, but is s/p renal transplant in 2005 at [facility]), AF not on [facility], who presented to [name] ED after cardiac arrest athome 1/12. Apparently 1/11 in the evening hours he developed chest pain and dyspnea, but proceeded to go to bed. Then 1/12 AM his wife awoke around 0930 and found him to be unresponsive and foaming at the mouth. He was still breathing. As she called EMS he fell to the floor. Upon EMS arrival the patient was pulseless and ACLS was initiated. CPR was performed for approximately 12 minutes with two doses of epinephrine. He suffered injury to his L periorbit at some point during all this, raising concern for head injury and he was treated as a level one trauma in ED. Upon arrival he was unresponsive and was immediately intubated. He was emergently taken for pan-CT scan. No operable injury was identified. PCCM asked to admit.   Past Medical History:   Paroxysmal AF (no AC)  ESRD S/p Renal transplant x2  HFrEF  HTN  HLD  T2DM  PAD  Significant Hospital Events:  1/12 admit for PEA arrest 1/13 Pneumothorax development with pigtail catheter placed. Patient had increasing pressor requirements.   Consults:  Neurosurgery (S/o) Trauma surgery (S/o) Nephrology  Procedures:  CVL R subclavian 1/12 > ETT 1/12 > Pigtail catheter 1/14 >   Significant Diagnostic Tests:  1/12 CTA head > no large vessel occlusion. Mod-severe stenosis of the dominant R vert.  1/12 CT Maxillofacial > Subtle fracture of the posterior wall of the right maxillary sinus is new since September and likely  acute. Trace fluid within the sinus. Hematoma/contusion involving the right parotid gland and regional subcutaneous tissues. Left forehead and periorbital hematoma without underlying fracture. 1/12 CT chest/abd/pelv > Numerous acute rib fractures: Right ribs 1 through 7 (with rib 2 through 4 flail segment) and left ribs 2 through 7. Mild intercostal hematoma, but no pneumothorax, hemothorax or pulmonary contusion. Low-density layering pleural effusions appear mildly increased from last month. Chronic atelectasis.  Micro Data:  1/12 Urine Cultures > Negative 1/13 Blood Cultures > NGTD @ 24 hours   Antimicrobials:  Ceftriaxone 1/13 >>   Interim History / Subjective:   Yesterday afternoon, CXR demonstrated new pneumothorax. Pigtail catheter placed with repeat CXR not showing significant improvement. Repeat CXR several hours later did show improvement.   Overnight, patient was noted to be hyperkalemic with worsening renal function. Fluids were held to avoid overload.   Objective   Blood pressure (!) 127/55, pulse 88, temperature (!) 100.4 F (38 C), resp. rate (!) 21, height 5\' 11"  (1.803 m), weight 90.3 kg, SpO2 100 %.    Vent Mode: PRVC FiO2 (%):  [30 %-40 %] 30 % Set Rate:  [22 bmp] 22 bmp Vt Set:  [600 mL] 600 mL PEEP:  [5 cmH20] 5 cmH20 Plateau Pressure:  [18 cmH20-22 cmH20] 20 cmH20   Intake/Output Summary (Last 24 hours) at 10/03/2020 0733 Last data filed at 10/03/2020 0100 Gross per 24 hour  Intake 3189 ml  Output 647 ml  Net 2542 ml   [employer]  10/09/2020 1142 10/02/20 0343 10/03/20 0500  Weight: 86.2 kg 87.2 kg 90.3 kg   Physical Exam Vitals and nursing note reviewed.  Constitutional:      Appearance: He is normal weight. He is ill-appearing.     Interventions: He is sedated and intubated.  HENT:     Head: Contusion (left eye), left periorbital erythema and laceration (left eye, laceration has been sutured) present.  Eyes:     Conjunctiva/sclera:     Left eye:  Hemorrhage present.     Pupils: Pupils are equal, round, and reactive to light.  Cardiovascular:     Rate and Rhythm: Normal rate and regular rhythm.     Heart sounds: Normal heart sounds.     Comments: Pitting edema worsened compared to yesterday.  Pulmonary:     Effort: He is intubated.     Breath sounds: No stridor. No wheezing, rhonchi or rales.  Abdominal:     General: Bowel sounds are decreased.     Palpations: Abdomen is soft.  Musculoskeletal:     Right lower leg: 2+ Pitting Edema present.     Left lower leg: 2+ Pitting Edema present.  Neurological:     Comments: Pupils equal round and reactive.  Patient able to breathe over the vent when instructed to do so.  Following some commands intermittently.    Resolved Hospital Problem list     Assessment & Plan:   # PEA cardiac arrest: Witnessed, no bystander CPR. 12 min ACLS. Initially suspected to be cardiogenic in the setting of pulmonary edema. TTE today showed recovered EF with no new wall motion abnormalities to explain.  Troponin mildly elevated, however likely due to CPR and not ACS.  With some purposeful movements yesterday, although quite sedated this morning. TTM not pursued.   # Shock: Mixed picture. TTE does show ventricular collapse supporting low end-diastolic volume concerning for hypovolemia, however today, patient has worsening edema. He continues to fever with concern for aspiration pneumonia versus HCAP in the setting of recent hospitalization; he is currently only on ceftriaxone. Blood cultures so far NGTD. Urine culture negative.  - Levophed + Vasopressin, titrate to maintain MAP above 65 - Plan to titrate down Levophed first - Continue Solu-cortef 50 mg q6h  - Trend fever curve - Blood cultures pending - Given recent admission, will broaden ceftriaxone to cefepime to cover for HCAP  # Acute hypoxic respiratory failure: Pulmonary edema with possible aspiration event during arrest. # Right Pneumothorax:  Secondary to numerous rib fractures versus subclavian central line complication. Pigtail catheter in place. Repeat CXR this AM shows improvement in pneumothorax.  - Full vent support - Daily SBTs as mental status allows - Bronchial hygiene - Continue Cefepime - Pigtail catheter to suction   # Acute Renal Failure: Worsening creatinine function with essentially no UOP in the last 24 hours (27 cc only). Electrolyte abnormalities are beginning to develop including hyperkalemia. High chance patient will end up needing CRRT if family would like to pursue aggressive care. Discussed with Dr. Candiss Norse; will need to determine neurological status before initiating CRRT.  # CKD:  Hx renal transplant x 2 (1983, 2005 and now on Cellcept, Prograf, Prednisone). # Hypomagnesemia  # Hyperkalemia  - Nephrology following; appreciate their recommendations  - Trend creatinine - Strict in/outs  - Continue cell-cept, prograf, prednisone - Daily Tac level - Will consider CRRT once neurological status is evaluated.   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  Neuro examination is mixed as patient is able to follow commands only intermittently.  Consulted neurology who recommended a stat MRI brain with and without contrast to evaluate. - MRI Brain w/wo ordered - Will discuss next steps with Dr. Rory Percy after results are available.   # A. Fib, Paroxysmal: Previous hx, not on AC. Stable at this point.  - Monitor for RVR  # HFrEF with recovered EF: LVEF 40-45% with grade 3 DD. Repeat TTE this admission shows improvement in EF however, evidence of severe LVH with cavity obliteration during systole. Today, he is volume overloaded. - Hold further diuresis for now - Holding additional fluids - Hold GDMT   # T2DM - CBG monitoring  - SSI  # R, L rib fx: flail segments by CT on the right # R maxillary sinus wall fracture - Pain management - No indication for surgical  intervention   Best practice (evaluated daily)  Diet: Tube Feeds  Pain/Anxiety/Delirium protocol (if indicated): Fentanyl VAP protocol (if indicated): Per protocol DVT prophylaxis: SQH GI prophylaxis: Pepcid Glucose control: SSI Mobility: BR Disposition: FULL  Goals of Care:  Last date of multidisciplinary goals of care discussion: 1/12 Family and staff present: Daughter present at bedside.  Summary of discussion:Will have family meeting today.  Follow up goals of care discussion due: 1/14 Code Status: FULL code  Labs   CBC: Recent Labs  Lab 09/29/2020 1208 10/03/2020 1218 10/17/2020 1222 10/11/2020 1751 10/02/20 0136 10/02/20 1614 10/03/20 0420  WBC 7.8  --   --  7.2 9.8  --  15.9*  HGB 8.7*   < > 9.2* 8.9* 8.3* 7.8* 7.8*  HCT 29.6*   < > 27.0* 30.3* 27.8* 23.0* 25.5*  MCV 101.4*  --   --  98.4 99.6  --  97.3  PLT 139*  --   --  139* 121*  --  138*   < > = values in this interval not displayed.    Basic Metabolic Panel: Recent Labs  Lab 10/13/2020 1208 09/30/2020 1218 10/10/2020 1222 10/02/20 0136 10/02/20 0957 10/02/20 1614 10/02/20 1644 10/03/20 0420  NA 147* 146* 147* 145  --  141  --  140  K 5.0 5.3* 5.0 4.9  --  4.5  --  5.3*  CL 114*  --  114* 115*  --   --   --  109  CO2 22  --   --  15*  --   --   --  16*  GLUCOSE 94  --  84 168*  --   --   --  276*  BUN 40*  --  41* 45*  --   --   --  56*  CREATININE 2.27*  --  2.20* 2.76*  --   --   --  4.31*  CALCIUM 9.9  --   --  9.3  --   --   --  8.7*  MG  --   --   --  1.5* 2.0  --  1.8 1.8  PHOS  --   --   --   --  3.9  --  4.0 4.9*   GFR: Estimated Creatinine Clearance: 16.3 mL/min (A) (by C-G formula based on SCr of 4.31 mg/dL (H)). Recent Labs  Lab 09/25/2020 1205 10/16/2020 1208 09/26/2020 1751 10/02/20 0136 10/03/20 0420  WBC  --  7.8 7.2 9.8 15.9*  LATICACIDVEN 2.1*  --   --   --   --     Liver Function Tests:  Recent Labs  Lab 10/04/2020 1208 10/02/20 0930  AST 50* 48*  ALT 22 22  ALKPHOS 93 72   BILITOT 0.6 1.2  PROT 5.3* 4.5*  ALBUMIN 2.9* 2.4*   No results for input(s): LIPASE, AMYLASE in the last 168 hours. No results for input(s): AMMONIA in the last 168 hours.  ABG    Component Value Date/Time   PHART 7.298 (L) 09/24/2020 1218   PCO2ART 49.4 (H) 10/04/2020 1218   PO2ART 85 10/02/2020 1218   HCO3 18.1 (L) 10/02/2020 1614   TCO2 19 (L) 10/02/2020 1614   ACIDBASEDEF 7.0 (H) 10/02/2020 1614   O2SAT 54.0 10/02/2020 1614     Coagulation Profile: Recent Labs  Lab 10/11/2020 1208  INR 1.2    Cardiac Enzymes: No results for input(s): CKTOTAL, CKMB, CKMBINDEX, TROPONINI in the last 168 hours.  HbA1C: Hgb A1c MFr Bld  Date/Time Value Ref Range Status  10/02/2020 06:50 PM 6.1 (H) 4.8 - 5.6 % Final    Comment:    (NOTE) Pre diabetes:          5.7%-6.4%  Diabetes:              >6.4%  Glycemic control for   <7.0% adults with diabetes     CBG: Recent Labs  Lab 10/02/20 1138 10/02/20 1518 10/02/20 2109 10/02/20 2348 10/03/20 0423  GLUCAP 170* 225* 231* 232* 238*    Review of Systems:   Patient is encephalopathic and/or intubated. Therefore history has been obtained from chart review.   Past Medical History:  He,  has no past medical history on file.   Surgical History:    Social History:      Family History:  His family history is not on file.   Allergies Allergies  Allergen Reactions  . Amitriptyline     Hallucinations      Home Medications  Prior to Admission medications   Not on File     Dr. Jose Persia Internal Medicine PGY-2  10/03/2020, 7:33 AM

## 2020-10-03 NOTE — Procedures (Signed)
Central Venous Catheter Insertion Procedure Note  [name]  [id number]  [birth year]  Date:10/03/20  Time:6:26 PM   Provider Performing:Aamilah Augenstein S Timmia Cogburn   Procedure: Insertion of Non-tunneled Central Venous Catheter(36556)with US guidance (67703)    Indication(s) Hemodialysis  Consent Risks of the procedure as well as the alternatives and risks of each were explained to the patient and/or caregiver.  Consent for the procedure was obtained and is signed in the bedside chart  Anesthesia Topical only with 1% lidocaine   Timeout Verified patient identification, verified procedure, site/side was marked, verified correct patient position, special equipment/implants available, medications/allergies/relevant history reviewed, required imaging and test results available.  Sterile Technique Maximal sterile technique including full sterile barrier drape, hand hygiene, sterile gown, sterile gloves, mask, hair covering, sterile ultrasound probe cover (if used).  Procedure Description Area of catheter insertion was cleaned with chlorhexidine and draped in sterile fashion.   With real-time ultrasound guidance a HD catheter was placed into the right internal jugular vein.  Nonpulsatile blood flow and easy flushing noted in all ports.  The catheter was sutured in place and sterile dressing applied.  Complications/Tolerance None; patient tolerated the procedure well. Chest X-ray is ordered to verify placement for internal jugular or subclavian cannulation.  Chest x-ray is not ordered for femoral cannulation.  EBL Minimal  Specimen(s) None  Michael Apo, MD, PhD 10/03/2020, 6:27 PM [facility] [phone] or if no answer [phone]

## 2020-10-03 NOTE — Progress Notes (Signed)
[facility] Progress Note    Assessment:  1. PEA cardiac arrest, out of hospital. CPR 12 min. Etiology unclear. TTM deferred 2. Shock w/ pressor requirements 3. AHRF, vent dependent 4. AKI, worsening, anuric 5. CKD4, h/o renal transplant x 2 ('83 & '05). Baseline Cr ~2-2.2 6. Anion gap metabolic acidosis 7. Hyperkalemia, mild 8. 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 Uncontrolled DM2 w/ hyperglycemia, mgmt per primary service  Plan: -weaning sedation to see neuro status, neuro on board, MRI. This will be a determining factor of whether or not he will be a candidate for renal replacement therapy. I have conveyed this to his daughter at the bedside who is in full agreement -if ok from a neuro status, recommend temp line placement and initiation of CRRT sooner rather than later as his he is becoming more hypervolemic -lasix challenge -temporize hyperK, consider lokelma -monitor blood gas, if pH<7.2, recommend starting bicarb gtt -Avoid nephrotoxic medications including NSAIDs and iodinated intravenous contrast exposure unless the latter is absolutely indicated.  Preferred narcotic agents for pain control are hydromorphone, fentanyl, and methadone. Morphine should not be used. Avoid Baclofen and avoid oral sodium phosphate and magnesium citrate based laxatives / bowel preps. Continue strict Input and Output monitoring. Will monitor the patient closely with you and intervene or adjust therapy as indicated by changes in clinical status/labs   Subjective:   Anuric, sedation being weaned. Neuro consulted. Becoming more hypervolemic   Objective:   BP 128/66 (BP Location: Left Arm)   Pulse 81   Temp 99.5 F (37.5 C) (Bladder)   Resp 13   Ht _0  (1.803 m)   Wt 90.3 kg   SpO2 92%   BMI 27.77 kg/m   Intake/Output Summary (Last 24 hours) at 10/03/2020 1419 Last data filed at 10/03/2020 1200 Gross per 24 hour  Intake 2384.32 ml  Output 695 ml  Net 1689.32 ml    Weight change: 4.117 kg  Physical Exam: Gen:ill appearing CVS:reg rate Resp:intubated Abd:nd, soft Ext:+edema Neuro: sedated, unresponsive  Imaging: DG Abd 1 View  Result Date: 10/02/2020 CLINICAL DATA:  Excessive oral secretions. EXAM: ABDOMEN - 1 VIEW COMPARISON:  Abdominal CT yesterday. FINDINGS: Enteric tube is in place. Tip is likely below the diaphragm in the stomach, exact location of the side port is difficult to delineate, but may be in the region of the gastroesophageal junction. No bowel dilatation in the upper abdomen. Right pelvic calcification corresponds to a calcified renal transplant on CT yesterday. IMPRESSION: Enteric tube in place, tip likely below the diaphragm in the stomach. The exact location of the side port is difficult to delineate, but may be in the region of the gastroesophageal junction. Electronically Signed   By: Keith Rake M.D.   On: 10/02/2020 18:24   MR BRAIN W WO CONTRAST  Addendum Date: 10/03/2020   ADDENDUM REPORT: 10/03/2020 14:08 ADDENDUM: The study was reviewed by telephone with Dr. Rory Percy on 10/03/2020 at 1400 hours. There is the suggestion of subtle FLAIR and diffusion-weighted signal hyperintensity involving cortex primarily in the posterior aspects of both cerebral hemispheres, however this is not clearly confirmed on standard T2 weighted sequences and there is no gyral edema. Findings are indeterminate for a more global hypoxic-ischemic injury. Electronically Signed   By: Logan Bores M.D.   On: 10/03/2020 14:08   Result Date: 10/03/2020 CLINICAL DATA:  Cardiac arrest.  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 EXAM: MRI HEAD WITHOUT AND WITH CONTRAST TECHNIQUE: Multiplanar, multiecho pulse sequences of the  brain and surrounding structures were obtained without and with intravenous contrast. CONTRAST:  54m GADAVIST GADOBUTROL 1 MMOL/ML IV SOLN COMPARISON:  Head and maxillofacial CTs 10/17/2020 and MRI 05/28/2020 FINDINGS: Brain: There are scattered  small acute to early subacute cortical and subcortical infarcts in the frontal, parietal, and occipital lobes bilaterally with small infarcts also noted in the left basal ganglia and in both cerebellar hemispheres. A small amount of petechial blood products are associated with a small left occipital infarct. There are a few scattered chronic cerebral microhemorrhages bilaterally. There is mild cerebral atrophy. No mass, midline shift, or extra-axial fluid collection is identified. Vascular: Major intracranial vascular flow voids are preserved. Skull and upper cervical spine: Unremarkable bone marrow signal. Sinuses/Orbits: Unremarkable orbits. Mild mucosal thickening in the paranasal sinuses. Small bilateral mastoid effusions. Other: Partially visualized right facial and left periorbital soft tissue swelling with hematoma is demonstrated on the prior CT. IMPRESSION: Scattered small acute to early subacute cerebral and cerebellar infarcts. Electronically Signed: By: ALogan BoresM.D. On: 10/03/2020 13:46   DG CHEST PORT 1 VIEW  Result Date: 10/03/2020 CLINICAL DATA:  Intubated, trauma, CPR EXAM: PORTABLE CHEST 1 VIEW COMPARISON:  Chest radiograph from one day prior. FINDINGS: Right subclavian central venous catheter terminates over the cavoatrial junction. Endotracheal tube tip is 1.7 cm above the carina. Enteric tube enters stomach with the tip not seen on this image. Right pigtail chest tube is stable. Stable cardiomediastinal silhouette with mild cardiomegaly. Small right apical/lateral pneumothorax, approximately 5%, stable to slightly decreased. No left pneumothorax. No pleural effusion. No overt pulmonary edema. Left retrocardiac and patchy right basilar lung opacities are similar. Mild subcutaneous emphysema in the lateral right chest wall is slightly decreased. IMPRESSION: 1. Endotracheal tube tip 1.7 cm above the carina, consider retracting 0.5 cm. Otherwise stable well-positioned support structures. 2.  Stable to slightly decreased small right apical/lateral pneumothorax. 3. Stable mild cardiomegaly without overt pulmonary edema. 4. Stable left retrocardiac and patchy right basilar lung opacities, favor atelectasis. Electronically Signed   By: JIlona SorrelM.D.   On: 10/03/2020 08:34   DG Chest Port 1 View  Result Date: 10/02/2020 CLINICAL DATA:  Tube placement EXAM: PORTABLE CHEST 1 VIEW COMPARISON:  October 02, 2020 FINDINGS: There is a right-sided chest tube in place. There is a small right-sided pneumothorax which has decreased in size since the prior study. The right subclavian line is well position. The endotracheal tube terminates just above the carina by approximately 1.8 cm. The enteric tube appears to extend below the left hemidiaphragm. The heart size remains enlarged. There is no left-sided pneumothorax. Bibasilar airspace disease and small bilateral pleural effusions are noted. There is subcutaneous gas along the patient's right flank. There are old healed left-sided rib fractures. IMPRESSION: 1. Lines and tubes as above. 2. Persistent but decreased right-sided pneumothorax. 3. Persistent bibasilar airspace disease and small bilateral pleural effusions. Electronically Signed   By: CConstance HolsterM.D.   On: 10/02/2020 19:42   DG CHEST PORT 1 VIEW  Result Date: 10/02/2020 CLINICAL DATA:  Pneumothorax. EXAM: PORTABLE CHEST 1 VIEW COMPARISON:  Radiograph earlier today, 2 hours prior. FINDINGS: Endotracheal tube tip is 2.7 cm from the carina. Enteric tube in place. Right subclavian central line tip in the SVC. Right pneumothorax is increased in size from earlier today, despite pigtail catheter projecting over the central lower lung zone. Exact pneumothorax measurement difficult due to degree of patient rotation, but likely at least 40%. Cardiomegaly with pericardial calcification, aortic tortuosity and  atherosclerosis. Bilateral pleural effusions and adjacent atelectasis. Multiple right rib  fractures. Increasing subcutaneous emphysema in the right chest wall. IMPRESSION: 1. Increased size of right pneumothorax from earlier this day, likely at least 40%. Right pigtail catheter in place. 2. Increasing subcutaneous emphysema in the right chest wall. 3. Unchanged cardiomegaly, bilateral pleural effusions, and adjacent atelectasis. Electronically Signed   By: Keith Rake M.D.   On: 10/02/2020 18:29   DG CHEST PORT 1 VIEW  Result Date: 10/02/2020 CLINICAL DATA:  Chest tube placement for pneumothorax EXAM: PORTABLE CHEST 1 VIEW COMPARISON:  October 02, 2020 study obtained earlier in the day FINDINGS: Chest tube now present on the right with tip directed medially. Persistent pneumothorax on the right without appreciable change. No tension component. Endotracheal tube tip is 2.5 cm above the carina. Central catheter tip is at the cavoatrial junction. Nasogastric tube tip and side port in proximal stomach. There is persistent airspace opacity in the left lower lobe as well as ill-defined opacity in the mid and lower lung regions bilaterally. There is stable cardiomegaly with pulmonary vascularity normal. There is aortic atherosclerosis. No adenopathy. No bone lesions. IMPRESSION: Chest tube present on the right with no appreciable change in pneumothorax currently compared to earlier in the day. This circumstance warrants additional radiographic imaging in 3-4 hours to assess for diminution in pneumothorax size. If patient were to have symptoms suggesting increase in size of pneumothorax, earlier imaging would be warranted. Other tube and catheter positions as described. Airspace opacity bilaterally, most notably in the left lower lobe, persists. Stable cardiomegaly. Aortic Atherosclerosis (ICD10-I70.0). These results will be called to the ordering clinician or representative by the Radiologist Assistant, and communication documented in the PACS or [employer]. Electronically Signed   By: Lowella Grip III M.D.   On: 10/02/2020 15:35   DG CHEST PORT 1 VIEW  Result Date: 10/02/2020 CLINICAL DATA:  Acute respiratory failure with hypoxia EXAM: PORTABLE CHEST 1 VIEW COMPARISON:  Portable exam 1352 hours compared to 10/20/2020 FINDINGS: Tip of endotracheal tube projects 2.4 cm above carina. Nasogastric tube extends into stomach. New RIGHT subclavian central venous catheter with tip projecting over SVC. Moderate RIGHT pneumothorax identified new since prior study, estimated at approximately 35% No mediastinal shift. Upper normal heart size. Normal mediastinal contours and pulmonary vascularity. Atherosclerotic calcification aorta. BILATERAL pulmonary infiltrates in mid to lower lungs greater on LEFT No pleural effusion or acute osseous findings. IMPRESSION: Moderate RIGHT pneumothorax estimated at 35% without mediastinal shift. Increased infiltrates in the mid to lower lung since previous study. Critical Value/emergent results were called by telephone at the time of interpretation on 10/02/2020 at 1417 hours to provider DR. MATTHEW HUNSUCKER , who verbally acknowledged these results. Electronically Signed   By: Lavonia Dana M.D.   On: 10/02/2020 14:18   DG Abd Portable 1V  Result Date: 10/02/2020 CLINICAL DATA:  Feeding tube placement EXAM: PORTABLE ABDOMEN - 1 VIEW COMPARISON:  October 02, 2020 FINDINGS: The enteric tube now projects over the gastric body/fundus. The tip is pointed laterally. The visualized bowel gas pattern is nonobstructive and nonspecific. 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 IMPRESSION: Feeding tube tip projects over the stomach. Electronically Signed   By: Constance Holster M.D.   On: 10/02/2020 19:44   ECHOCARDIOGRAM LIMITED  Result Date: 10/02/2020    ECHOCARDIOGRAM LIMITED REPORT   Patient Name:  [name] Date of Exam: 10/02/2020  Medical Rec #:  [id number]       Height:       71.0 in Accession #:    [id number]      Weight:       192.2 lb Date of Birth:  March 09, 1946      BSA:          2.073 m Patient Age:    88 years        BP:           122/46 mmHg Patient Gender: M               HR:           84 bpm. Exam Location:  Inpatient Procedure: Limited Echo, Cardiac Doppler and Color Doppler Indications:    Cardiac arrest  History:        Patient has no prior history of Echocardiogram examinations.                 CHF, Arrythmias:Cardiac Arrest; Risk Factors:Hypertension and                 obesity. CKD, renal trransplant, pleural effusion.  Sonographer:    Dustin Flock Referring Phys: [phone] MATTHEW R HUNSUCKER  Sonographer Comments: Echo performed with patient supine and on artificial respirator. Patient had echo at another hospital IMPRESSIONS  1. Left ventricular ejection fraction, by estimation, is 50 to 55%. The left ventricle has low normal function. The left ventricle has no regional wall motion abnormalities. There is severe left ventricular hypertrophy. 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 Right ventricular systolic function is mildly reduced. The right ventricular size is normal. Tricuspid regurgitation signal is inadequate for assessing PA pressure.  3. The mitral valve is abnormal. Mild to moderate mitral annular calcification. No evidence of mitral valve regurgitation.  4. The aortic valve was not well visualized. Aortic valve regurgitation is not visualized. No aortic stenosis is present.  5. Aortic dilatation noted. There is mild dilatation of the ascending aorta, measuring 38 mm. FINDINGS  Left Ventricle: Left ventricular ejection fraction, by estimation, is 50 to 55%. The left ventricle has low normal function. The left ventricle has no regional wall motion abnormalities. The left  ventricular internal cavity size was small. There is severe left ventricular hypertrophy. Right Ventricle: The right ventricular size is normal. No increase in right ventricular wall thickness. Right ventricular systolic function is mildly reduced. Tricuspid regurgitation signal is inadequate for assessing PA pressure. Pericardium: Trivial pericardial effusion is present. Mitral Valve: The mitral valve is abnormal. Mild to moderate mitral annular calcification. Tricuspid Valve: The tricuspid valve is normal in structure. Tricuspid valve regurgitation is not demonstrated. Aortic Valve: The aortic valve was not well visualized. Aortic valve regurgitation is not visualized. No aortic stenosis is present. Aortic valve peak gradient measures 6.9 mmHg. Pulmonic Valve: The pulmonic valve was not well visualized. Pulmonic valve regurgitation is not visualized. Aorta: The aortic root is normal in size and structure and aortic dilatation noted. There is mild dilatation of the ascending aorta, measuring 38 mm. IAS/Shunts: No atrial level shunt detected by color flow Doppler. Additional Comments: There is a small pleural effusion. LEFT VENTRICLE PLAX 2D LVIDd:         3.40 cm  Diastology LVIDs:         2.75 cm  LV e' medial:  5.33 cm/s LV PW:         1.40 cm  LV E/e' medial:  15.9 LV IVS:        1.50 cm  LV e' lateral:   9.79 cm/s LVOT diam:     2.50 cm  LV E/e' lateral: 8.7 LVOT Area:     4.91 cm  RIGHT VENTRICLE RV Basal diam:  2.70 cm RV S prime:     6.20 cm/s LEFT ATRIUM         Index LA diam:    3.30 cm 1.59 cm/m  AORTIC VALVE AV Vmax:      131.00 cm/s AV Peak Grad: 6.9 mmHg  AORTA Ao Root diam: 3.50 cm MITRAL VALVE MV Area (PHT): 3.48 cm    SHUNTS MV Decel Time: 218 msec    Systemic Diam: 2.50 cm MV E velocity: 84.80 cm/s MV A velocity: 57.40 cm/s MV E/A ratio:  1.48 Oswaldo Milian MD Electronically signed by Oswaldo Milian MD Signature Date/Time: 10/02/2020/10:24:05 AM    Final     Labs: BMET Recent  Labs  Lab 09/26/2020 1208 10/08/2020 1218 10/18/2020 1222 10/02/20 0136 10/02/20 0957 10/02/20 1614 10/02/20 1644 10/03/20 0420  NA 147* 146* 147* 145  --  141  --  140  K 5.0 5.3* 5.0 4.9  --  4.5  --  5.3*  CL 114*  --  114* 115*  --   --   --  109  CO2 22  --   --  15*  --   --   --  16*  GLUCOSE 94  --  84 168*  --   --   --  276*  BUN 40*  --  41* 45*  --   --   --  56*  CREATININE 2.27*  --  2.20* 2.76*  --   --   --  4.31*  CALCIUM 9.9  --   --  9.3  --   --   --  8.7*  PHOS  --   --   --   --  3.9  --  4.0 4.9*   CBC Recent Labs  Lab 10/18/2020 1208 10/07/2020 1218 09/29/2020 1751 10/02/20 0136 10/02/20 1614 10/03/20 0420  WBC 7.8  --  7.2 9.8  --  15.9*  HGB 8.7*   < > 8.9* 8.3* 7.8* 7.8*  HCT 29.6*   < > 30.3* 27.8* 23.0* 25.5*  MCV 101.4*  --  98.4 99.6  --  97.3  PLT 139*  --  139* 121*  --  138*   < > = values in this interval not displayed.    Medications:    . chlorhexidine gluconate (MEDLINE KIT)  15 mL Mouth Rinse BID  . Chlorhexidine Gluconate Cloth  6 each Topical Daily  . famotidine  20 mg Per Tube BID  . fentaNYL (SUBLIMAZE) injection  25 mcg Intravenous Once  . heparin  5,000 Units Subcutaneous Q8H  . hydrocortisone sod succinate (SOLU-CORTEF) inj  50 mg Intravenous Q6H  . insulin aspart  0-15 Units Subcutaneous Q4H  . insulin glargine  10 Units Subcutaneous Daily  . mouth rinse  15 mL Mouth Rinse 10 times per day  . mycophenolate  500 mg Per Tube BID  . sodium bicarbonate  650 mg Per Tube TID  . sodium chloride flush  10 mL Intracatheter Q8H  . sodium chloride flush  10-40 mL Intracatheter Q12H  . tacrolimus  1 mg Per Tube BID  Gean Quint, MD [facility] 10/03/2020, 2:19 PM

## 2020-10-03 NOTE — Progress Notes (Signed)
   10/03/20 0820  Airway 8 mm  Placement Date/Time: 10/18/2020 1120   Placed By: ED Physician  Airway Device: Endotracheal Tube  Laryngoscope Blade: MAC;3  ETT Types: Oral  Size (mm): 8 mm  Cuffed: Cuffed  Insertion attempts: 1  Airway Equipment: Stylet;Video Laryngoscope  Placement ...  Secured at (cm) 26 cm  Measured From Lips  Secured Location Left  Secured By Actuary Repositioned  (bite block in)  Prone position No  Cuff Pressure (cm H2O) 30 cm H2O  Site Condition Dry  Adult Ventilator Settings  Vent Type Servo i  Humidity HME  Vent Mode PRVC  Vt Set 600 mL  Set Rate 22 bmp  FiO2 (%) 30 %  I Time 0.7 Sec(s)  PEEP 5 cmH20  Adult Ventilator Measurements  Peak Airway Pressure 25 L/min  Mean Airway Pressure 10 cmH20  Resp Rate Spontaneous 0 br/min  Resp Rate Total 21 br/min  Exhaled Vt 617 mL  Measured Ve 12.3 mL  I:E Ratio Measured 1:2.3  Auto PEEP 0 cmH20  Total PEEP 5 cmH20  SpO2 100 %  Adult Ventilator Alarms  Alarms On Y  Ve High Alarm 20 L/min  Ve Low Alarm 4 L/min  Resp Rate High Alarm 42 br/min  Resp Rate Low Alarm 8  PEEP Low Alarm 3 cmH2O  Press High Alarm 55 cmH2O  VAP Prevention  Cuff pressure (initial) 30 cm H2O  Cuff pressure after change 30 cm H2O  HME changed No  Ventilator changed No  Transported while on vent No  HOB> 30 Degrees Y  Equipment wiped down Yes  Daily Weaning Assessment  Daily Assessment of Readiness to Wean Wean protocol criteria not met  Reason not met Apnea  Breath Sounds  Bilateral Breath Sounds Diminished  Airway Suctioning/Secretions  Suction Type ETT  Suction Device  Catheter  Secretion Amount Moderate  Secretion Color White  Secretion Consistency Thick  Suction Tolerance Tolerated well  Suctioning Adverse Effects None

## 2020-10-03 NOTE — Progress Notes (Signed)
Discussed w/ neuro given MRI findings and exam. Will move forward w/ CRRT sooner rather than later. Volume status worsened. All 2K/2.5Ca bags, target UF: net neg 50-100cc/hr. Appreciate CCM's assistance with temporary dialysis catheter placement. Updated wife [name] over the phone.  Gean Quint, MD [facility]

## 2020-10-03 NOTE — Consult Note (Addendum)
Neurology Consultation  Reason for Consult: encephalopathy with concern for anoxic brain injury post cardiac arrest.  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   History is obtained from: chart  HPI: [name] is a 75 y.o. male with a PMHx of AF not on [facility], CKD s/p renal transplant 2005 at [facility], CHF with preserved EF, HTN, HLD, DM II, and PAD.  Pt presented to [name] ED after cardiac arrest at home 1/12. Apparently 09/30/20 in the evening hours he developed chest pain and dyspnea, but proceeded to go to bed. Then 09/25/2020 AM his wife awoke around 0930 and found him to be unresponsive and foaming at the mouth. He was still breathing. As she called EMS he fell to the floor. Upon EMS arrival the patient was pulseless and ACLS was initiated. CPR was performed for approximately 12 minutes with two doses of epinephrine. He suffered injury to his L periorbit at some point during all this, raising concern for head injury and he was treated as a level one trauma in ED. Upon arrival he was unresponsive and was immediately intubated. He was emergently taken for pan-CT scan. No operable injury was identified. PCCM admitted.   Today, neuro was contacted by PCCM for continued encephalopathy and possible anoxic brain injury.  No report of seizure activity.  ROS:  Unable to obtain due to altered mental status.   PMHx: HLD, HTN, DM, CKD s/p kidney transplant in 2005, PAD  No family history on file.  Social History:   has no history on file for tobacco use, alcohol use, and drug use.  Medications  Current Facility-Administered Medications:  .  0.9 %  sodium chloride infusion, , Intravenous, PRN, Collene Gobble, MD, Last Rate: 10 mL/hr at 10/03/20 1200, 10 mL/hr at 10/03/20 1200 .  acetaminophen (TYLENOL) tablet 650 mg, 650 mg, Oral, Q6H PRN, Hunsucker, Bonna Gains, MD, 650 mg at 10/15/2020 1757 .  ceFEPIme (MAXIPIME) 2 g in sodium chloride 0.9 % 100  mL IVPB, 2 g, Intravenous, Q24H, Jose Persia, MD, Last Rate: 200 mL/hr at 10/03/20 1201, 2 g at 10/03/20 1201 .  chlorhexidine gluconate (MEDLINE KIT) (PERIDEX) 0.12 % solution 15 mL, 15 mL, Mouth Rinse, BID, Hunsucker, Bonna Gains, MD, 15 mL at 10/03/20 0841 .  Chlorhexidine Gluconate Cloth 2 % PADS 6 each, 6 each, Topical, Daily, Hunsucker, Bonna Gains, MD, 6 each at 10/02/20 1000 .  docusate (COLACE) 50 MG/5ML liquid 100 mg, 100 mg, Per Tube, BID PRN, Hunsucker, Bonna Gains, MD .  famotidine (PEPCID) tablet 20 mg, 20 mg, Per Tube, BID, Hunsucker, Bonna Gains, MD, 20 mg at 10/03/20 0840 .  feeding supplement (VITAL AF 1.2 CAL) liquid 1,000 mL, 1,000 mL, Per Tube, Continuous, Hunsucker, Bonna Gains, MD, Last Rate: 40 mL/hr at 10/03/20 0855, 1,000 mL at 10/03/20 0855 .  fentaNYL (SUBLIMAZE) bolus via infusion 25 mcg, 25 mcg, Intravenous, Q15 min PRN, Hunsucker, Bonna Gains, MD, 75 mcg at 10/02/20 1446 .  fentaNYL (SUBLIMAZE) injection 25 mcg, 25 mcg, Intravenous, Once, Hunsucker, Bonna Gains, MD .  fentaNYL 2515mg in NS 2569m(102mml) infusion-PREMIX, 25-200 mcg/hr, Intravenous, Continuous, Hunsucker, MatBonna GainsD, Stopped at 10/03/20 1014 .  heparin injection 5,000 Units, 5,000 Units, Subcutaneous, Q8H, Hunsucker, MatBonna GainsD, 5,000 Units at 10/03/20 0550 .  hydrocortisone sodium succinate (SOLU-CORTEF) 100 MG injection 50 mg, 50 mg, Intravenous, Q6H, Hunsucker, MatBonna GainsD, 50 mg at 10/03/20 1218 .  insulin aspart (novoLOG) injection  0-15 Units, 0-15 Units, Subcutaneous, Q4H, Jose Persia, MD, 8 Units at 10/03/20 1204 .  insulin glargine (LANTUS) injection 10 Units, 10 Units, Subcutaneous, Daily, Jose Persia, MD, 10 Units at 10/03/20 1203 .  MEDLINE mouth rinse, 15 mL, Mouth Rinse, 10 times per day, Hunsucker, Bonna Gains, MD, 15 mL at 10/03/20 1205 .  midazolam (VERSED) injection 2 mg, 2 mg, Intravenous, Q4H PRN, Hunsucker, Bonna Gains, MD .  mycophenolate (CELLCEPT) oral suspension 50 mg/mL, 500  mg, Per Tube, BID, Hunsucker, Bonna Gains, MD, 500 mg at 10/03/20 0847 .  norepinephrine (LEVOPHED) 16 mg in 242m premix infusion, 0-40 mcg/min, Intravenous, Titrated, Hunsucker, MBonna Gains MD, Last Rate: 22.5 mL/hr at 10/03/20 1158, 24 mcg/min at 10/03/20 1158 .  ondansetron (ZOFRAN) injection 4 mg, 4 mg, Intravenous, Q6H PRN, Hunsucker, MBonna Gains MD .  polyethylene glycol (MIRALAX / GLYCOLAX) packet 17 g, 17 g, Per Tube, Daily PRN, Hunsucker, MBonna Gains MD .  sodium bicarbonate tablet 650 mg, 650 mg, Per Tube, TID, Hunsucker, MBonna Gains MD, 650 mg at 10/03/20 0840 .  sodium chloride flush (NS) 0.9 % injection 10 mL, 10 mL, Intracatheter, Q8H, DMerlene LaughterF, NP, 10 mL at 10/03/20 0844 .  sodium chloride flush (NS) 0.9 % injection 10-40 mL, 10-40 mL, Intracatheter, Q12H, Hunsucker, MBonna Gains MD, 10 mL at 10/03/20 0845 .  sodium chloride flush (NS) 0.9 % injection 10-40 mL, 10-40 mL, Intracatheter, PRN, Hunsucker, MBonna Gains MD .  tacrolimus (PROGRAF) 1 mg/mL oral suspension 1 mg, 1 mg, Per Tube, BID, Hunsucker, MBonna Gains MD, 1 mg at 10/03/20 0845 .  vasopressin (PITRESSIN) 20 Units in sodium chloride 0.9 % 100 mL infusion-*FOR SHOCK*, 0.03 Units/min, Intravenous, Continuous, Hunsucker, MBonna Gains MD, Last Rate: 9 mL/hr at 10/03/20 1200, 0.03 Units/min at 10/03/20 1200   Exam: Current vital signs: BP 128/66 (BP Location: Left Arm)   Pulse 81   Temp 99.5 F (37.5 C) (Bladder)   Resp 13   Ht 5' 11"  (1.803 m)   Wt 90.3 kg   SpO2 92%   BMI 27.77 kg/m  Vital signs in last 24 hours: Temp:  [99.32 F (37.4 C)-100.6 F (38.1 C)] 99.5 F (37.5 C) (01/14 1215) Pulse Rate:  [68-102] 81 (01/14 1215) Resp:  [13-26] 13 (01/14 1215) BP: (83-143)/(33-77) 128/66 (01/14 1215) SpO2:  [92 %-100 %] 92 % (01/14 1215) FiO2 (%):  [30 %] 30 % (01/14 1215) Weight:  [90.3 kg] 90.3 kg (01/14 0500)   Examination on NO sedation. Sedation was turned off around 0800 hrs 10/03/20.   GENERAL: Arousable to  name calling. On bedrest, intubated. Generalized swelling to head with bruising and sutures on the left forehead.  HEENT: - Normocephalic and atraumatic, chemosis OS conjunctiva LUNGS - ventilated CV - RRR on tele ABDOMEN - Soft, nontender Ext: Arms are edematous with fragile skin. C bilateral feet. well perfused GCS: Best motor  5    Best eye  3          Best verbal   1       Total  9 NEURO:  Mental Status: opens eyes to name calling. Non verbal. Can not follow commands.  Speech/Language: intubated. No effort to mouth words. No verbal response to novious stimuli.  Cranial Nerves:  II: PERRL 4 mm/brisk. Eyes are dysconjugate. Blinks to threat.  III, IV, VI: Eyes dysconjugate. Does not track examiner.  V, VII:  Corneal reflex + OU VIII:hearing intact to voice IX, X: weak cough reflex. XI: Head  grossly midline. XII: intubated Motor: Unable to test due to not following commands.  Tone is decreased in RUE. Otherwise normal. Bulk is normal.  Sensation- withdraws to pain in BLE. No response to RUE. Localizes to pain LUE. Coordination: no tremors. No clonus.  DTRs: 1+ bilat brachialis, 0 patella Gait- deferred, bedrest  Late entry NIHSS 1a Level of Conscious.: 2 1b LOC Questions: 2 1c LOC Commands:2  2 Best Gaze: 1 3 Visual: 0 4 Facial Palsy: 2 5a Motor Arm - left: 2 5b Motor Arm - Right: 4 6a Motor Leg - Left: 3 6b Motor Leg - Right: 3 7 Limb Ataxia: 0 8 Sensory: 0 9 Best Language: 3 10 Dysarthria: 2 11 Extinct. and Inatten.: 0 TOTAL: 26   Labs  CBC    Component Value Date/Time   WBC 15.9 (H) 10/03/2020 0420   RBC 2.62 (L) 10/03/2020 0420   HGB 7.8 (L) 10/03/2020 0420   HCT 25.5 (L) 10/03/2020 0420   PLT 138 (L) 10/03/2020 0420   MCV 97.3 10/03/2020 0420   MCH 29.8 10/03/2020 0420   MCHC 30.6 10/03/2020 0420   RDW 16.2 (H) 10/03/2020 0420    CMP     Component Value Date/Time   NA 140 10/03/2020 0420   K 5.3 (H) 10/03/2020 0420   CL 109 10/03/2020 0420   CO2  16 (L) 10/03/2020 0420   GLUCOSE 276 (H) 10/03/2020 0420   BUN 56 (H) 10/03/2020 0420   CREATININE 4.31 (H) 10/03/2020 0420   CALCIUM 8.7 (L) 10/03/2020 0420   PROT 4.5 (L) 10/02/2020 0930   ALBUMIN 2.4 (L) 10/02/2020 0930   AST 48 (H) 10/02/2020 0930   ALT 22 10/02/2020 0930   ALKPHOS 72 10/02/2020 0930   BILITOT 1.2 10/02/2020 0930   GFRNONAA 14 (L) 10/03/2020 0420  2D echo with normal LVEF, mildly reduced right ventricular systolic function, normal mitral valve, normal aortic valve, aortic dilatation.  Imaging 09/28/2020 CT-scan of the head 1. No acute intracranial abnormality. Stable non contrast CT appearance of the brain. 2. Left forehead and periorbital scalp hematoma with no underlying calvarium fracture.  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 ADDENDUM: The study was reviewed by telephone with Dr. Rory Percy on 10/03/2020 at 1400 hours. There is the suggestion of subtle FLAIR and diffusion-weighted signal hyperintensity involving cortex primarily in the posterior aspects of both cerebral hemispheres, however this is not clearly confirmed on standard T2 weighted sequences and there is no gyral edema. Findings are indeterminate for a more global hypoxic-ischemic injury.  CHY:IFOY technically difficult study is suggestive of profound diffuse encephalopathy, nonspecific etiology.  No seizures or epileptiform discharges were seen throughout the recording.  Family conversation: This NP called home phone and daughters were there.  Prior to this admission, pt was walking independently, taking care of his own finances, independent on ADLs, cooked sometimes, and no issues with toileting. The pt has not driven in years.  We discussed that it is encouraging that pt has brain stem reflexes. Discussed how most patients do experience some type of hypoxic brain injury after cardiac arrest. Discussed the MRI. Discussed slow  road to recovery and we do not know or can not predict what his long term neurological recovery will be. NP stressed to family that each patient heals differently and no matter what, this would be a long road. Discussed that pt may not ever get back to his pre hospital baseline.  They inquired as  to when CRRT would start and NP informed them that we would communicate our findings to other services on the team.   Assessment: 75 yo male who  presented to [name] ED 2 days ago after cardiac arrest at home 09/21/2020. Apparently 09/30/20 in the evening hours he developed chest pain and dyspnea, but proceeded to go to bed. Then 10/06/2020 AM his wife awoke around 0930 and found him to be unresponsive and foaming at the mouth. He was still breathing. As she called EMS he fell to the floor. Upon EMS arrival the patient was pulseless and ACLS was initiated. CPR was performed for approximately 12 minutes with two doses of epinephrine. He suffered injury to his L periorbit at some point during all this, raising concern for head injury and he was treated as a level one trauma in ED. Upon arrival he was unresponsive and was immediately intubated. He was emergently taken for pan-CT scan. No operable injury was identified. PCCM admitted.   Neurology was asked to consult for encephalopathy and possible hypoxic/anoxic brain injury secondary to cardiac arrest. MRI brain today showed scattered small acute to early subacute cerebral and cerebellar infarct as well as possible hypoxic injury although and reveal intact brainstem function as well as evidence of purposeful cortical function. EEG showed profound diffuse encephalopathy, non specific etiology without seizures.   At this time, 2 days out of the cardiac arrest, he is not someone who is exhibiting absolutely no cortical function, and given the fact that he has a lot of other metabolic derangements-we need to fix those derangements before more meaningful prognostication can  be performed.  Impression: 1. Small acute to subacute scattered infarcts on MRIb that are not conclusive of hypoxic/anoxic brain injury.  2. 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   Recommendations: -No AEDs needed. -Critical care and nephrology planning for CRRT, I think that would be fair given the acute change in his renal status and if that improves, might help with improvement of his mentation. -Not only does he have brain stem reflexes present examination does reveal some purposeful cortical function- which definitely means that he is not brain dead and has some purposeful cortical function but there are a lot of confounding factors that preclude proper prognostication.  Time will be of the essence to see how he recovers and how much recovery he makes at this time. -For stroke prevention, given history of atrial fibrillation, he would require anticoagulation at some point-I would at least wait 5 days from his last known well before his anticoagulation is started to minimize risk of hemorrhagic transformation.  I discussed my plan with the nephrologist on the unit. I will communicate my findings to the critical care team as well. I will continue to follow the patient with you.  Do not see a need for further stroke work-up other than echocardiogram.  Might consider doing head and neck CTA depending on his exam.  Discussed with daughters as per conversation above.  -  Pt seen by Clance Boll, NP/Neuro and later by MD. Note/plan to be edited by MD as needed.  Pager: [phone]   Attending Neurohospitalist Addendum Patient seen and examined with APP/Resident. Agree with the history and physical as documented above. Agree with the plan as documented, which I helped formulate. I have independently reviewed the chart, obtained history, review of systems and examined the patient.I have personally reviewed pertinent head/neck/spine imaging  (CT/MRI). Briefly status post cardiac arrest, consult for prognostication,  examination suggested for some purposeful cortical function in addition to intact brainstem function.  Multiple confounding factors such as deranged renal function present.  Once those are managed better, a better neurological exam might provide further insight into proper prognostication.  Time will be of the essence to see how he progresses further.  We will continue to follow with you. Please feel free to call with any questions.  -- Michael Portland, MD Neurologist [facility] Pager: [phone]  CRITICAL CARE ATTESTATION Performed by: Michael Portland, MD Total critical care time: 32 minutes Critical care time was exclusive of separately billable procedures and treating other patients and/or supervising APPs/Residents/Students Critical care was necessary to treat or prevent imminent or life-threatening deterioration due to hypoxic ischemic encephalopathy, postcardiac arrest This patient is critically ill and at significant risk for neurological worsening and/or death and care requires constant monitoring. Critical care was time spent personally by me on the following activities: development of treatment plan with patient and/or surrogate as well as nursing, discussions with consultants, evaluation of patient's response to treatment, examination of patient, obtaining history from patient or surrogate, ordering and performing treatments and interventions, ordering and review of laboratory studies, ordering and review of radiographic studies, pulse oximetry, re-evaluation of patient's condition, participation in multidisciplinary rounds and medical decision making of high complexity in the care of this patient.

## 2020-10-03 NOTE — TOC CAGE-AID Note (Signed)
Transition of Care [facility]) - CAGE-AID Screening   Patient Details  Name: Veer Elamin MRN: [id number] Date of Birth: [birth year]    Iran Planas, RN, TRN Phone Number: Trauma Services 10/03/2020, 9:04 AM     CAGE-AID Screening: Substance Abuse Screening unable to be completed due to: : Patient unable to participate (Patient on ventilator/sedated)

## 2020-10-03 NOTE — Progress Notes (Signed)
[facility] Progress Note Patient Name: [name] DOB: 1946/02/26 MRN: [id number]   Date of Service  10/03/2020  HPI/Events of Note  Patient with ventricular ectopy at the onset of dialysis.  eICU Interventions  CMP and magnesium ordered.        Kerry Kass Dulcie Gammon 10/03/2020, 11:31 PM

## 2020-10-03 NOTE — Progress Notes (Signed)
PHARMACY NOTE:  ANTIMICROBIAL RENAL DOSAGE ADJUSTMENT  Current antimicrobial regimen includes a mismatch between antimicrobial dosage and estimated renal function.  As per policy approved by the Pharmacy & Therapeutics and Medical Executive Committees, the antimicrobial dosage will be adjusted accordingly.  Current antimicrobial dosage:  Cefepime 2g IV every 24 hours  Indication: Pneumonia  Renal Function:  Estimated Creatinine Clearance: 16.3 mL/min (A) (by C-G formula based on SCr of 4.31 mg/dL (H)). []      On intermittent HD, scheduled: [x]      On CRRT    Antimicrobial dosage has been changed to:  Cefepime 2g IV every 12 hours  Additional comments:   Thank you for allowing pharmacy to be a part of this patient's care.  Sloan Leiter, PharmD, BCPS, BCCCP Clinical Pharmacist Please refer to [facility] for [address] numbers 10/03/2020 4:46 PM

## 2020-10-04 ENCOUNTER — Inpatient Hospital Stay (HOSPITAL_COMMUNITY): Payer: Medicare Other

## 2020-10-04 DIAGNOSIS — S270XXD Traumatic pneumothorax, subsequent encounter: Secondary | ICD-10-CM

## 2020-10-04 DIAGNOSIS — I469 Cardiac arrest, cause unspecified: Secondary | ICD-10-CM | POA: Diagnosis not present

## 2020-10-04 DIAGNOSIS — J9601 Acute respiratory failure with hypoxia: Secondary | ICD-10-CM | POA: Diagnosis not present

## 2020-10-04 LAB — CBC
HCT: 22.9 % — ABNORMAL LOW (ref 39.0–52.0)
Hemoglobin: 7.1 g/dL — ABNORMAL LOW (ref 13.0–17.0)
MCH: 29.6 pg (ref 26.0–34.0)
MCHC: 31 g/dL (ref 30.0–36.0)
MCV: 95.4 fL (ref 80.0–100.0)
Platelets: 110 10*3/uL — ABNORMAL LOW (ref 150–400)
RBC: 2.4 MIL/uL — ABNORMAL LOW (ref 4.22–5.81)
RDW: 16.2 % — ABNORMAL HIGH (ref 11.5–15.5)
WBC: 14.3 10*3/uL — ABNORMAL HIGH (ref 4.0–10.5)
nRBC: 0.2 % (ref 0.0–0.2)

## 2020-10-04 LAB — RENAL FUNCTION PANEL
Albumin: 2.1 g/dL — ABNORMAL LOW (ref 3.5–5.0)
Albumin: 2 g/dL — ABNORMAL LOW (ref 3.5–5.0)
Anion gap: 13 (ref 5–15)
Anion gap: 14 (ref 5–15)
BUN: 47 mg/dL — ABNORMAL HIGH (ref 8–23)
BUN: 39 mg/dL — ABNORMAL HIGH (ref 8–23)
CO2: 20 mmol/L — ABNORMAL LOW (ref 22–32)
CO2: 21 mmol/L — ABNORMAL LOW (ref 22–32)
Calcium: 8 mg/dL — ABNORMAL LOW (ref 8.9–10.3)
Calcium: 7.7 mg/dL — ABNORMAL LOW (ref 8.9–10.3)
Chloride: 106 mmol/L (ref 98–111)
Chloride: 104 mmol/L (ref 98–111)
Creatinine, Ser: 3.41 mg/dL — ABNORMAL HIGH (ref 0.61–1.24)
Creatinine, Ser: 2.71 mg/dL — ABNORMAL HIGH (ref 0.61–1.24)
GFR, Estimated: 18 mL/min — ABNORMAL LOW (ref >60)
GFR, Estimated: 24 mL/min — ABNORMAL LOW (ref >60)
Glucose, Bld: 205 mg/dL — ABNORMAL HIGH (ref 70–99)
Glucose, Bld: 122 mg/dL — ABNORMAL HIGH (ref 70–99)
Phosphorus: 3.1 mg/dL (ref 2.5–4.6)
Phosphorus: 2.6 mg/dL (ref 2.5–4.6)
Potassium: 4.4 mmol/L (ref 3.5–5.1)
Potassium: 3.8 mmol/L (ref 3.5–5.1)
Sodium: 139 mmol/L (ref 135–145)
Sodium: 139 mmol/L (ref 135–145)

## 2020-10-04 LAB — CBC WITH DIFFERENTIAL/PLATELET
Abs Immature Granulocytes: 0.14 10*3/uL — ABNORMAL HIGH (ref 0.00–0.07)
Basophils Absolute: 0 10*3/uL (ref 0.0–0.1)
Basophils Relative: 0 %
Eosinophils Absolute: 0 10*3/uL (ref 0.0–0.5)
Eosinophils Relative: 0 %
HCT: 24.1 % — ABNORMAL LOW (ref 39.0–52.0)
Hemoglobin: 7.1 g/dL — ABNORMAL LOW (ref 13.0–17.0)
Immature Granulocytes: 1 %
Lymphocytes Relative: 3 %
Lymphs Abs: 0.3 10*3/uL — ABNORMAL LOW (ref 0.7–4.0)
MCH: 28.6 pg (ref 26.0–34.0)
MCHC: 29.5 g/dL — ABNORMAL LOW (ref 30.0–36.0)
MCV: 97.2 fL (ref 80.0–100.0)
Monocytes Absolute: 0.6 10*3/uL (ref 0.1–1.0)
Monocytes Relative: 5 %
Neutro Abs: 9.6 10*3/uL — ABNORMAL HIGH (ref 1.7–7.7)
Neutrophils Relative %: 91 %
Platelets: 103 10*3/uL — ABNORMAL LOW (ref 150–400)
RBC: 2.48 MIL/uL — ABNORMAL LOW (ref 4.22–5.81)
RDW: 16.4 % — ABNORMAL HIGH (ref 11.5–15.5)
WBC: 10.6 10*3/uL — ABNORMAL HIGH (ref 4.0–10.5)
nRBC: 1 % — ABNORMAL HIGH (ref 0.0–0.2)

## 2020-10-04 LAB — GLUCOSE, CAPILLARY
Glucose-Capillary: 204 mg/dL — ABNORMAL HIGH (ref 70–99)
Glucose-Capillary: 164 mg/dL — ABNORMAL HIGH (ref 70–99)
Glucose-Capillary: 128 mg/dL — ABNORMAL HIGH (ref 70–99)
Glucose-Capillary: 98 mg/dL (ref 70–99)
Glucose-Capillary: 138 mg/dL — ABNORMAL HIGH (ref 70–99)
Glucose-Capillary: 160 mg/dL — ABNORMAL HIGH (ref 70–99)

## 2020-10-04 LAB — TACROLIMUS LEVEL: Tacrolimus (FK506) - LabCorp: 4.5 ng/mL (ref 2.0–20.0)

## 2020-10-04 LAB — MAGNESIUM: Magnesium: 1.9 mg/dL (ref 1.7–2.4)

## 2020-10-04 MED ORDER — PRISMASOL BGK 4/2.5 32-4-2.5 MEQ/L REPLACEMENT SOLN
Status: DC
  Filled 2020-10-04 (×15): qty 5000

## 2020-10-04 MED ORDER — MAGNESIUM SULFATE 2 GM/50ML IV SOLN
2.0000 g | Freq: Once | INTRAVENOUS | Status: AC
  Administered 2020-10-04: 2 g via INTRAVENOUS
  Filled 2020-10-04: qty 50

## 2020-10-04 MED ORDER — PRISMASOL BGK 4/2.5 32-4-2.5 MEQ/L EC SOLN
Status: DC
  Filled 2020-10-04 (×78): qty 5000

## 2020-10-04 MED ORDER — PRISMASOL BGK 4/2.5 32-4-2.5 MEQ/L REPLACEMENT SOLN
Status: DC
  Filled 2020-10-04 (×25): qty 5000

## 2020-10-04 MED ORDER — FAMOTIDINE 20 MG PO TABS
20.0000 mg | ORAL_TABLET | Freq: Every day | ORAL | Status: DC
  Administered 2020-10-05 – 2020-10-14 (×10): 20 mg
  Filled 2020-10-04 (×10): qty 1

## 2020-10-04 NOTE — Progress Notes (Signed)
NAME:  [name], MRN:  [id number], DOB:  [birth year], LOS: 3 ADMISSION DATE:  10/03/2020, CONSULTATION DATE:  1/12 REFERRING MD:  Dr. Rogene Houston, CHIEF COMPLAINT:  Cardiac arrest   Brief History:  75 year old male with renal transplant history admitted 1/12 after PEA arrest 12 minutes.  History of Present Illness:  75 year old male with PMH as below, which is significant for CKD (previously on HD, but is s/p renal transplant in 2005 at [facility]), AF not on [facility], who presented to [name] after cardiac arrest athome 1/12. Apparently 1/11 in the evening hours he developed chest pain and dyspnea, but proceeded to go to bed. Then 1/12 AM his wife awoke around 0930 and found him to be unresponsive and foaming at the mouth. He was still breathing. As she called EMS he fell to the floor. Upon EMS arrival the patient was pulseless and ACLS was initiated. CPR was performed for approximately 12 minutes with two doses of epinephrine. He suffered injury to his L periorbit at some point during all this, raising concern for head injury and he was treated as a level one trauma in ED. Upon arrival he was unresponsive and was immediately intubated. He was emergently taken for pan-CT scan. No operable injury was identified. PCCM asked to admit.   Past Medical History:   Paroxysmal AF (no AC)  ESRD S/p Renal transplant x2  HFrEF  HTN  HLD  T2DM  PAD  Significant Hospital Events:  1/12 admit for PEA arrest 1/13 Pneumothorax development with pigtail catheter placed. Patient had increasing pressor requirements.   Consults:  Neurosurgery (S/o) Trauma surgery (S/o) Nephrology Neurology   Procedures:  CVL R subclavian 1/12 > ETT 1/12 > Pigtail catheter 1/14 >  R IJ HD cath 1/14 >>   Significant Diagnostic Tests:  1/12 CTA head > no large vessel occlusion. Mod-severe stenosis of the dominant R vert.  1/12 CT Maxillofacial > Subtle fracture of the posterior wall of the right maxillary sinus is  new since September and likely acute. Trace fluid within the sinus. Hematoma/contusion involving the right parotid gland and regional subcutaneous tissues. Left forehead and periorbital hematoma without underlying fracture. 1/12 CT chest/abd/pelv > Numerous acute rib fractures: Right ribs 1 through 7 (with rib 2 through 4 flail segment) and left ribs 2 through 7. Mild intercostal hematoma, but no pneumothorax, hemothorax or pulmonary contusion. Low-density layering pleural effusions appear mildly increased from last month. Chronic atelectasis. MRI Brain 1/14 >> scattered small acute to early subacute cerebral and cerebellar infarcts, few scattered chronic cerebral microhemorrhages bilaterally, small amount of petechial blood associated with small left occipital infarct, possible more global hypoxic ischemic injury given subtle diffusion-weighted signal hyperintensity involving cortex posteriorly  Micro Data:  1/12 Urine Cultures > Negative 1/13 Blood Cultures >   Antimicrobials:  Ceftriaxone 1/13 >> 1/14 Cefepime 1/14 >>   Interim History / Subjective:  HD catheter placed, started CVVHD on 1/14 Low-dose fentanyl running Norepinephrine weaned off overnight, remains on vasopressin   Objective   Blood pressure (!) 103/57, pulse 66, temperature (!) 96.26 F (35.7 C), temperature source Bladder, resp. rate (!) 24, height 5\' 11"  (1.803 m), weight 89.5 kg, SpO2 96 %.    Vent Mode: PRVC FiO2 (%):  [30 %-40 %] 40 % Set Rate:  [14 bmp-24 bmp] 24 bmp Vt Set:  [60 mL-600 mL] 600 mL PEEP:  [5 cmH20] 5 cmH20 Plateau Pressure:  [17 cmH20-23 cmH20] 21 cmH20   Intake/Output Summary (Last  24 hours) at 10/04/2020 0817 Last data filed at 10/04/2020 0700 Gross per 24 hour  Intake 2768.6 ml  Output 1381 ml  Net 1387.6 ml   Filed Weights   10/02/20 0343 10/03/20 0500 10/04/20 0452  Weight: 87.2 kg 90.3 kg 89.5 kg   Gen: Critically ill-appearing man, laying in bed HEENT: ET tube in good position,  laceration above the left eye, left eye edema and hemorrhage present. Pupils do react Neck: Right IJ HD catheter, right subclavian catheter Lungs coarse bilaterally, no wheezes CV: Distant, regular, no murmur Abdomen: Nondistended, hypoactive bowel sounds Extremities: No significant lower extremity edema Skin: Abrasions bilateral upper extremities dressed  Resolved Hospital Problem list     Assessment & Plan:   # PEA cardiac arrest: Witnessed, no bystander CPR. 12 min ACLS. Initially suspected to be cardiogenic in the setting of pulmonary edema. TTE today showed recovered EF with no new wall motion abnormalities to explain.  Troponin mildly elevated, however likely due to CPR and not ACS.  With some purposeful movements yesterday, although quite sedated this morning. TTM not pursued.   # Shock: Improving. Mixed picture. Concerning for hypovolemia, also possible septic shock. No clear source at this time -Norepinephrine weaned to off, plan to DC vasopressin 1/15 and follow -Continue stress dose steroids as ordered (on chronic steroids) -Empiric cefepime to cover possible HCAP -Follow culture data  # Acute hypoxic respiratory failure: Pulmonary edema with possible aspiration event during arrest. # Right Pneumothorax: Secondary to numerous rib fractures versus subclavian central line complication. Pigtail catheter in place. Repeat CXR this AM shows improvement in pneumothorax.  -Continue PRVC, may be in a position to initiate some PSV but not an extubation candidate currently due to mental status -Bronchial hygiene -Right pigtail catheter in place and to suction -Follow intermittent chest x-ray  # Acute Renal Failure:  # CKD:  Hx renal transplant x 2 (1983, 2005 and now on Cellcept, Prograf, Prednisone). # Hypomagnesemia  # Hyperkalemia  -Appreciate nephrology evaluation and management -Initiated CVVHD on 1/14, plan to continue as we follow neurological progress/prognosis -If  hemodynamics improve may be able to transition to intermittent HD -Continue CellCept, Prograf -Following tacrolimus level  # Acute Encephalopathy, concerning for anoxic injury:  #Small acute subcortical bilateral scattered infarcts, possibly embolic -Some petechial blood present on MRI brain in association with infarcts. Hold anticoagulation at this time -Appreciate neurology evaluation. Plan is to follow neurological status, progress over the next 48-72 hours to help determine overall neurological prognosis -Correct any metabolic contributors to encephalopathy  # A. Fib, Paroxysmal: Previous hx, not on AC. Stable at this point.  -Holding off on anticoagulation even with possible embolic CVAs given his petechial blood associated with one of his occipital cerebral infarcts  # HFrEF with recovered EF: LVEF 40-45% with grade 3 DD. Repeat TTE this admission shows improvement in EF however, evidence of severe LVH with cavity obliteration during systole. Today, he is volume overloaded. -Volume status and volume removal via CVVHD  # T2DM -Sliding scale insulin as per protocol  # R, L rib fx: flail segments by CT on the right # R maxillary sinus wall fracture -Pain management  Best practice (evaluated daily)  Diet: Tube Feeds  Pain/Anxiety/Delirium protocol (if indicated): Fentanyl VAP protocol (if indicated): Per protocol DVT prophylaxis: SQH GI prophylaxis: Pepcid Glucose control: SSI Mobility: BR Disposition: FULL  Goals of Care:  Last date of multidisciplinary goals of care discussion: 1/12 Family and staff present: Daughter updated at bedside 1/14 Summary  of discussion: Follow up goals of care discussion due: 1/ 19 Code Status: FULL code  Labs   CBC: Recent Labs  Lab 09/30/2020 1208 09/30/2020 1218 09/29/2020 1751 10/02/20 0136 10/02/20 1614 10/03/20 0420 10/04/20 0440  WBC 7.8  --  7.2 9.8  --  15.9* 14.3*  HGB 8.7*   < > 8.9* 8.3* 7.8* 7.8* 7.1*  HCT 29.6*   < > 30.3*  27.8* 23.0* 25.5* 22.9*  MCV 101.4*  --  98.4 99.6  --  97.3 95.4  PLT 139*  --  139* 121*  --  138* 110*   < > = values in this interval not displayed.    Basic Metabolic Panel: Recent Labs  Lab 10/03/2020 1208 10/15/2020 1218 10/07/2020 1222 10/12/2020 1222 10/02/20 0136 10/02/20 0957 10/02/20 1614 10/02/20 1644 10/03/20 0420 10/03/20 1745 10/03/20 2309 10/04/20 0440  NA 147*   < > 147*  --  145  --  141  --  140  --  141 139  K 5.0   < > 5.0  --  4.9  --  4.5  --  5.3*  --  5.0 4.4  CL 114*  --  114*  --  115*  --   --   --  109  --  105 106  CO2 22  --   --   --  15*  --   --   --  16*  --  18* 20*  GLUCOSE 94  --  84  --  168*  --   --   --  276*  --  243* 205*  BUN 40*  --  41*  --  45*  --   --   --  56*  --  56* 47*  CREATININE 2.27*  --  2.20*  --  2.76*  --   --   --  4.31*  --  4.23* 3.41*  CALCIUM 9.9  --   --   --  9.3  --   --   --  8.7*  --  7.9* 8.0*  MG  --   --   --    < > 1.5* 2.0  --  1.8 1.8 1.9 1.9  --   PHOS  --   --   --   --   --  3.9  --  4.0 4.9* 5.6*  --  3.1   < > = values in this interval not displayed.   GFR: Estimated Creatinine Clearance: 20.5 mL/min (A) (by C-G formula based on SCr of 3.41 mg/dL (H)). Recent Labs  Lab 10/12/2020 1205 10/10/2020 1208 10/02/2020 1751 10/02/20 0136 10/03/20 0420 10/04/20 0440  WBC  --    < > 7.2 9.8 15.9* 14.3*  LATICACIDVEN 2.1*  --   --   --   --   --    < > = values in this interval not displayed.    Liver Function Tests: Recent Labs  Lab 09/20/2020 1208 10/02/20 0930 10/03/20 2309 10/04/20 0440  AST 50* 48* 56*  --   ALT 22 22 30   --   ALKPHOS 93 72 94  --   BILITOT 0.6 1.2 0.8  --   PROT 5.3* 4.5* 4.7*  --   ALBUMIN 2.9* 2.4* 2.2* 2.1*   No results for input(s): LIPASE, AMYLASE in the last 168 hours. No results for input(s): AMMONIA in the last 168 hours.  ABG    Component Value Date/Time   PHART 7.298 (L)  10/17/2020 1218   PCO2ART 49.4 (H) 09/30/2020 1218   PO2ART 85 09/28/2020 1218   HCO3  18.1 (L) 10/02/2020 1614   TCO2 19 (L) 10/02/2020 1614   ACIDBASEDEF 7.0 (H) 10/02/2020 1614   O2SAT 54.0 10/02/2020 1614     Coagulation Profile: Recent Labs  Lab 10/20/2020 1208  INR 1.2    Cardiac Enzymes: No results for input(s): CKTOTAL, CKMB, CKMBINDEX, TROPONINI in the last 168 hours.  HbA1C: Hgb A1c MFr Bld  Date/Time Value Ref Range Status  10/02/2020 06:50 PM 6.1 (H) 4.8 - 5.6 % Final    Comment:    (NOTE) Pre diabetes:          5.7%-6.4%  Diabetes:              >6.4%  Glycemic control for   <7.0% adults with diabetes     CBG: Recent Labs  Lab 10/03/20 1522 10/03/20 1926 10/03/20 2340 10/04/20 0411 10/04/20 0725  GLUCAP 242* 233* 236* 204* 164*   Independent critical care time 33 minutes   Baltazar Apo, MD, PhD 10/04/2020, 8:30 AM [facility] [phone] or if no answer [phone]

## 2020-10-04 NOTE — Progress Notes (Signed)
Neurology Progress Note   S:// Pt is seen and examined today. Pt is intubated. Not responding to name calling, sternal rub or noxious stimuli. Not able to follow any commands.  O:// Current vital signs: BP (!) 110/55   Pulse 71   Temp (!) 97.16 F (36.2 C)   Resp (!) 24   Ht _0  (1.803 m)   Wt 197 lb 5 oz (89.5 kg)   SpO2 98%   BMI 27.52 kg/m  Vital signs in last 24 hours: Temp:  [95.9 F (35.5 C)-100.04 F (37.8 C)] 97.16 F (36.2 C) (01/15 0830) Pulse Rate:  [64-185] 71 (01/15 0830) Resp:  [13-27] 24 (01/15 0830) BP: (78-158)/(43-77) 110/55 (01/15 0830) SpO2:  [91 %-100 %] 98 % (01/15 0830) FiO2 (%):  [30 %-40 %] 40 % (01/15 0821) Weight:  [197 lb 5 oz (89.5 kg)] 197 lb 5 oz (89.5 kg) (01/15 0452) GENERAL: Not arousable on name calling and noxious stimuli. HEENT: - Normocephalic and atraumatic, chemosis of Left conjunctiva with subconjunctival hematoma. Bruising and sutures on Left forehead. LUNGS - On Ventilator CV - RRR on Tele ABDOMEN - Soft,  nondistended  Ext: Arms edematous, covered on dressing. well perfused, Swelling present in Rt ankle NEURO:  Mental Status: Non verbal, opens eyes to loud voice.  Doesn't follow commands. Language: Intubated.  Do not respond to name calling or noxious stimuli. Cranial Nerves: PERRL 3 mm slow , Eyes are disconjugate, do not blink to threat, corneal reflex present bilaterally. Motor: To noxious stimulation, no response in right upper extremity.  Mild attempt to localize and left upper extremity, no response in lower extremities today to noxious stimulation. Reflexes- Left pattellar 1+ , Rt Patellar- 0, UE's- 0, Planters- Mute Tone: is flaccid in UE and LE's and bulk is normal Sensation-  Coordination: Not able to follow commands. Gait- Not able to assess as pt is unresponsive.   Medications  Current Facility-Administered Medications:  .  0.9 %  sodium chloride infusion, , Intravenous, PRN, Collene Gobble, MD, Stopped at  10/04/20 [phone] .  acetaminophen (TYLENOL) tablet 650 mg, 650 mg, Oral, Q6H PRN, Hunsucker, Bonna Gains, MD, 650 mg at 09/22/2020 1757 .  ceFEPIme (MAXIPIME) 2 g in sodium chloride 0.9 % 100 mL IVPB, 2 g, Intravenous, Q12H, Priscella Mann, RPH, Stopped at 10/04/20 0436 .  chlorhexidine gluconate (MEDLINE KIT) (PERIDEX) 0.12 % solution 15 mL, 15 mL, Mouth Rinse, BID, Hunsucker, Bonna Gains, MD, 15 mL at 10/04/20 0727 .  Chlorhexidine Gluconate Cloth 2 % PADS 6 each, 6 each, Topical, Daily, Hunsucker, Bonna Gains, MD, 6 each at 10/03/20 1430 .  docusate (COLACE) 50 MG/5ML liquid 100 mg, 100 mg, Per Tube, BID PRN, Hunsucker, Bonna Gains, MD .  famotidine (PEPCID) tablet 20 mg, 20 mg, Per Tube, BID, Hunsucker, Bonna Gains, MD, 20 mg at 10/03/20 2204 .  feeding supplement (VITAL AF 1.2 CAL) liquid 1,000 mL, 1,000 mL, Per Tube, Continuous, Hunsucker, Bonna Gains, MD, Last Rate: 75 mL/hr at 10/04/20 0300, 1,000 mL at 10/04/20 0300 .  fentaNYL (SUBLIMAZE) bolus via infusion 25 mcg, 25 mcg, Intravenous, Q15 min PRN, Hunsucker, Bonna Gains, MD, 25 mcg at 10/04/20 0551 .  fentaNYL (SUBLIMAZE) injection 25 mcg, 25 mcg, Intravenous, Once, Hunsucker, Bonna Gains, MD .  fentaNYL 2573mg in NS 2571m(1063mml) infusion-PREMIX, 25-200 mcg/hr, Intravenous, Continuous, Hunsucker, MatBonna GainsD, Last Rate: 10 mL/hr at 10/04/20 0700, 100 mcg/hr at 10/04/20 0700 .  heparin injection 1,000-6,000 Units, 1,000-6,000 Units, CRRT, PRN, SinCandiss Norse  Vikas, MD, 1,300 Units at 10/03/20 1826 .  heparin injection 5,000 Units, 5,000 Units, Subcutaneous, Q8H, Hunsucker, Bonna Gains, MD, 5,000 Units at 10/04/20 0502 .  hydrocortisone sodium succinate (SOLU-CORTEF) 100 MG injection 50 mg, 50 mg, Intravenous, Q6H, Hunsucker, Bonna Gains, MD, 50 mg at 10/04/20 0431 .  insulin aspart (novoLOG) injection 0-15 Units, 0-15 Units, Subcutaneous, Q4H, Jose Persia, MD, 3 Units at 10/04/20 0740 .  insulin glargine (LANTUS) injection 10 Units, 10 Units, Subcutaneous,  Daily, Jose Persia, MD, 10 Units at 10/03/20 1203 .  MEDLINE mouth rinse, 15 mL, Mouth Rinse, 10 times per day, Hunsucker, Bonna Gains, MD, 15 mL at 10/04/20 0502 .  midazolam (VERSED) injection 2 mg, 2 mg, Intravenous, Q4H PRN, Hunsucker, Bonna Gains, MD .  mycophenolate (CELLCEPT) oral suspension 50 mg/mL, 500 mg, Per Tube, BID, Hunsucker, Bonna Gains, MD, 500 mg at 10/03/20 2207 .  norepinephrine (LEVOPHED) 16 mg in 253m premix infusion, 0-40 mcg/min, Intravenous, Titrated, Hunsucker, MBonna Gains MD, Last Rate: 20.6 mL/hr at 10/03/20 2158, 22 mcg/min at 10/03/20 2158 .  ondansetron (ZOFRAN) injection 4 mg, 4 mg, Intravenous, Q6H PRN, Hunsucker, MBonna Gains MD .  polyethylene glycol (MIRALAX / GLYCOLAX) packet 17 g, 17 g, Per Tube, Daily PRN, Hunsucker, MBonna Gains MD .  prismasol BGK 2/2.5 dialysis solution, , CRRT, Continuous, SGean Quint MD, Last Rate: 1,500 mL/hr at 10/04/20 0655, New Bag at 10/04/20 0655 .  prismasol BGK 2/2.5 replacement solution, , CRRT, Continuous, SGean Quint MD, Last Rate: 500 mL/hr at 10/04/20 0637, New Bag at 10/04/20 0637 .  prismasol BGK 2/2.5 replacement solution, , CRRT, Continuous, SGean Quint MD, Last Rate: 300 mL/hr at 10/03/20 2100, New Bag at 10/03/20 2100 .  sodium bicarbonate tablet 650 mg, 650 mg, Per Tube, TID, Hunsucker, MBonna Gains MD, 650 mg at 10/03/20 2204 .  sodium chloride flush (NS) 0.9 % injection 10 mL, 10 mL, Intracatheter, Q8H, Davis, Whitney F, NP, 10 mL at 10/04/20 0740 .  sodium chloride flush (NS) 0.9 % injection 10-40 mL, 10-40 mL, Intracatheter, Q12H, Hunsucker, MBonna Gains MD, 10 mL at 10/03/20 2211 .  sodium chloride flush (NS) 0.9 % injection 10-40 mL, 10-40 mL, Intracatheter, PRN, Hunsucker, MBonna Gains MD .  tacrolimus (PROGRAF) 1 mg/mL oral suspension 1 mg, 1 mg, Per Tube, BID, Hunsucker, MBonna Gains MD, 1 mg at 10/03/20 2204 .  vasopressin (PITRESSIN) 20 Units in sodium chloride 0.9 % 100 mL infusion-*FOR SHOCK*, 0.03 Units/min,  Intravenous, Continuous, Hunsucker, MBonna Gains MD, Last Rate: 6 mL/hr at 10/04/20 0829, 0.02 Units/min at 10/04/20 0829 Labs CBC    Component Value Date/Time   WBC 14.3 (H) 10/04/2020 0440   RBC 2.40 (L) 10/04/2020 0440   HGB 7.1 (L) 10/04/2020 0440   HCT 22.9 (L) 10/04/2020 0440   PLT 110 (L) 10/04/2020 0440   MCV 95.4 10/04/2020 0440   MCH 29.6 10/04/2020 0440   MCHC 31.0 10/04/2020 0440   RDW 16.2 (H) 10/04/2020 0440    CMP     Component Value Date/Time   NA 139 10/04/2020 0440   K 4.4 10/04/2020 0440   CL 106 10/04/2020 0440   CO2 20 (L) 10/04/2020 0440   GLUCOSE 205 (H) 10/04/2020 0440   BUN 47 (H) 10/04/2020 0440   CREATININE 3.41 (H) 10/04/2020 0440   CALCIUM 8.0 (L) 10/04/2020 0440   PROT 4.7 (L) 10/03/2020 2309   ALBUMIN 2.1 (L) 10/04/2020 0440   AST 56 (H) 10/03/2020 2309   ALT 30 10/03/2020 2309  ALKPHOS 94 10/03/2020 2309   BILITOT 0.8 10/03/2020 2309   GFRNONAA 18 (L) 10/04/2020 0440    Imaging I have reviewed images in epic and the results pertinent to this consultation are:  CT-scan of the brain 1. No acute intracranial abnormality. Stable non contrast CT appearance of the brain. 2. Left forehead and periorbital scalp hematoma with no underlying calvarium fracture.  MRI examination of the brain Scattered small acute to early subacute cerebral and cerebellar infarcts. ADDENDUM: The study was reviewed by telephone with Dr. Rory Percy on 10/03/2020 at 1400 hours. There is the suggestion of subtle FLAIR and diffusion-weighted signal hyperintensity involving cortex primarily in the posterior aspects of both cerebral hemispheres, however this is not clearly confirmed on standard T2 weighted sequences and there is no gyral edema. Findings are indeterminate for a more global hypoxic-ischemic injury.  RDE:[id number] difficult study is suggestive of profound diffuse encephalopathy, nonspecific etiology.No seizures or epileptiform discharges were seen  throughout the recording.  Assessment: Pt is a 75 year old male status post cardiac arrest, found unresponsive and foaming at mouth by his wife at home.  Patient was intubated upon coming to the hospital.  CT negative for acute intracranial abnormality.  MRI revealed scattered small acute to early subacute cerebral and cerebellar infarcts.  There was also suggestion of subtle FLAIR and diffusion-weighted signal hyperintensity involving cortex primarily in the posterior aspects of both cerebral hemispheres, however this is not clearly confirmed on standard T2 weighted sequences and there is no gyral edema.  Findings are in determinate for more global hypoxic-ischemic injury.  EEG is suggestive of profound diffuse encephalopathy, nonspecific etiology.  No seizures have been reported since hospitalization.  Blood glucose high at 205, creatinine elevated at 3.41, WBCs elevated to 14.3, Hb low at 7.1.  At this point there are multiple factors for patient's presentation including his renal function status.  Today's examination is mildly worse than yesterday with no response to noxious stimulation lower extremities.  We will have to attribute this to the multiple strokes as well as global hypoxic injury and hypoxic ischemic encephalopathy as well as deranged metabolic status. 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 fib and post CPR Toxic metabolic encephalopathy Hypoxic ischemic encephalopathy Anoxic/hypoxic brain injury  Recommendations: Continue to correct metabolic derangements as you are. No need for AEDs Metabolic derangements need to be stabilized before any formal prognostication can be done. No anticoagulation until at least 5 days from the last known well because he has a number of scattered embolic infarcts and there is risk of hemorrhagic transformation. If anticoagulation is needed from an  atrial fibrillation perspective, heparin drip stroke protocol should be considered. Discussed with Dr. Lamonte Sakai   Dr. Armando Reichert MD PGY1 [facility] Neurology   Attending Neurohospitalist Addendum Patient seen and examined with APP/Resident. Agree with the history and physical as documented above. Agree with the plan as documented, which I helped formulate. I have independently reviewed the chart, obtained history, review of systems and examined the patient.I have personally reviewed pertinent head/neck/spine imaging (CT/MRI).  Discussed with Dr. Lamonte Sakai Please feel free to call with any questions.  -- Amie Portland, MD Neurologist [facility] Pager: [phone]  CRITICAL CARE ATTESTATION Performed by: Amie Portland, MD Total critical care time: 30 minutes Critical care time was exclusive of separately billable procedures and treating other patients and/or supervising APPs/Residents/Students Critical care was necessary to treat or prevent imminent or life-threatening deterioration due to hypoxic/anoxic  brain injury, toxic metabolic encephalopathy.,  Acute ischemic strokes. This patient is critically ill and at significant risk for neurological worsening and/or death and care requires constant monitoring. Critical care was time spent personally by me on the following activities: development of treatment plan with patient and/or surrogate as well as nursing, discussions with consultants, evaluation of patient's response to treatment, examination of patient, obtaining history from patient or surrogate, ordering and performing treatments and interventions, ordering and review of laboratory studies, ordering and review of radiographic studies, pulse oximetry, re-evaluation of patient's condition, participation in multidisciplinary rounds and medical decision making of high complexity in the care of this patient.

## 2020-10-04 NOTE — Plan of Care (Signed)
  Problem: Health Behavior/Discharge Planning: Goal: Ability to manage health-related needs will improve Outcome: Progressing   Problem: Clinical Measurements: Goal: Ability to maintain clinical measurements within normal limits will improve Outcome: Progressing Goal: Will remain free from infection Outcome: Progressing Goal: Diagnostic test results will improve Outcome: Progressing Goal: Respiratory complications will improve Outcome: Progressing Goal: Cardiovascular complication will be avoided Outcome: Progressing   Problem: Activity: Goal: Risk for activity intolerance will decrease Outcome: Progressing   Problem: Nutrition: Goal: Adequate nutrition will be maintained Outcome: Progressing   Problem: Coping: Goal: Level of anxiety will decrease Outcome: Progressing   Problem: Elimination: Goal: Will not experience complications related to bowel motility Outcome: Progressing Goal: Will not experience complications related to urinary retention Outcome: Progressing   Problem: Pain Managment: Goal: General experience of comfort will improve Outcome: Progressing   Problem: Safety: Goal: Ability to remain free from injury will improve Outcome: Progressing   Problem: Skin Integrity: Goal: Risk for impaired skin integrity will decrease Outcome: Progressing   Problem: Coping: Goal: Will identify appropriate support needs Outcome: Progressing   Problem: Health Behavior/Discharge Planning: Goal: Ability to manage health-related needs will improve Outcome: Progressing   Problem: Ischemic Stroke/TIA Tissue Perfusion: Goal: Complications of ischemic stroke/TIA will be minimized Outcome: Progressing   Problem: Activity: Goal: Ability to tolerate increased activity will improve Outcome: Progressing   Problem: Respiratory: Goal: Ability to maintain a clear airway and adequate ventilation will improve Outcome: Progressing   Problem: Role Relationship: Goal: Method  of communication will improve Outcome: Progressing

## 2020-10-04 NOTE — Progress Notes (Signed)
RT unable to obtain abg. Pt. Right radial access is restricted. Left radial side was attempted x2 but unsuccessful.

## 2020-10-04 NOTE — Progress Notes (Signed)
[name] Progress Note    Assessment:  1. PEA cardiac arrest, out of hospital. CPR 12 min. Etiology unclear. TTM deferred 2. Shock w/ pressor requirements 3. AHRF, vent dependent 4. AKI, worsening, anuric. CRRT start date 10/03/20, line placement via CCM 1/14 5. CKD4, h/o renal transplant x 2 ('83 & '05). Baseline Cr ~2-2.2. goal tac 4-6 6. Anion gap metabolic acidosis 7. Hyperkalemia, mild 8. Encephalopathy, hypoxic and toxic metabolic 9. 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 Uncontrolled DM2 w/ hyperglycemia, mgmt per primary service  Plan: -continue with CRRT for now. UF limited by BP. Target net neg 50-100cc/hr as tolerated -change to all 4k/2.5cal bags once current bags run out -too soon for prognostication, appreciate neuro's assistance -tac trought 1/13 pending, maintain home immunosuppression -Avoid nephrotoxic medications including NSAIDs and iodinated intravenous contrast exposure unless the latter is absolutely indicated.  Preferred narcotic agents for pain control are hydromorphone, fentanyl, and methadone. Morphine should not be used. Avoid Baclofen and avoid oral sodium phosphate and magnesium citrate based laxatives / bowel preps. Continue strict Input and Output monitoring. Will monitor the patient closely with you and intervene or adjust therapy as indicated by changes in clinical status/labs   Subjective:   Net positive 1.4L, started on CRRT last night. Temp line placement per ccm. Neuro status worsened.   Objective:   BP (!) 100/49   Pulse 81   Temp 100.22 F (37.9 C)   Resp (!) 24   Ht 5' 11"  (1.803 m)   Wt 89.5 kg   SpO2 98%   BMI 27.52 kg/m   Intake/Output Summary (Last 24 hours) at 10/04/2020 1249 Last data filed at 10/04/2020 1200 Gross per 24 hour  Intake 2627.66 ml  Output 2008 ml  Net 619.66 ml   Weight change: -0.8 kg  Physical Exam: Gen:ill appearing CVS:reg rate Resp:intubated Abd:nd Ext:  anasarca Neuro: sedated, unresponsive Access: rij temp line c/d/i  Imaging: DG Abd 1 View  Result Date: 10/02/2020 CLINICAL DATA:  Excessive oral secretions. EXAM: ABDOMEN - 1 VIEW COMPARISON:  Abdominal CT yesterday. FINDINGS: Enteric tube is in place. Tip is likely below the diaphragm in the stomach, exact location of the side port is difficult to delineate, but may be in the region of the gastroesophageal junction. No bowel dilatation in the upper abdomen. Right pelvic calcification corresponds to a calcified renal transplant on CT yesterday. IMPRESSION: Enteric tube in place, tip likely below the diaphragm in the stomach. The exact location of the side port is difficult to delineate, but may be in the region of the gastroesophageal junction. Electronically Signed   By: Keith Rake M.D.   On: 10/02/2020 18:24   MR BRAIN W WO CONTRAST  Addendum Date: 10/03/2020   ADDENDUM REPORT: 10/03/2020 14:08 ADDENDUM: The study was reviewed by telephone with Dr. Rory Percy on 10/03/2020 at 1400 hours. There is the suggestion of subtle FLAIR and diffusion-weighted signal hyperintensity involving cortex primarily in the posterior aspects of both cerebral hemispheres, however this is not clearly confirmed on standard T2 weighted sequences and there is no gyral edema. Findings are indeterminate for a more global hypoxic-ischemic injury. Electronically Signed   By: Logan Bores M.D.   On: 10/03/2020 14:08   Result Date: 10/03/2020 CLINICAL DATA:  Cardiac arrest.  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 EXAM: MRI HEAD WITHOUT AND WITH CONTRAST TECHNIQUE: Multiplanar, multiecho pulse sequences of the brain and surrounding structures were obtained without and with intravenous contrast. CONTRAST:  55m GADAVIST GADOBUTROL  1 MMOL/ML IV SOLN COMPARISON:  Head and maxillofacial CTs 10/07/2020 and MRI 05/28/2020 FINDINGS: Brain: There are scattered small acute to early subacute cortical and subcortical infarcts in the frontal,  parietal, and occipital lobes bilaterally with small infarcts also noted in the left basal ganglia and in both cerebellar hemispheres. A small amount of petechial blood products are associated with a small left occipital infarct. There are a few scattered chronic cerebral microhemorrhages bilaterally. There is mild cerebral atrophy. No mass, midline shift, or extra-axial fluid collection is identified. Vascular: Major intracranial vascular flow voids are preserved. Skull and upper cervical spine: Unremarkable bone marrow signal. Sinuses/Orbits: Unremarkable orbits. Mild mucosal thickening in the paranasal sinuses. Small bilateral mastoid effusions. Other: Partially visualized right facial and left periorbital soft tissue swelling with hematoma is demonstrated on the prior CT. IMPRESSION: Scattered small acute to early subacute cerebral and cerebellar infarcts. Electronically Signed: By: Logan Bores M.D. On: 10/03/2020 13:46   DG CHEST PORT 1 VIEW  Result Date: 10/04/2020 CLINICAL DATA:  75 year old male status post fall, PEA arrest. History of renal failure, renal transplants. Right pneumothorax treated with chest tube. EXAM: PORTABLE CHEST 1 VIEW COMPARISON:  Portable chest 10/03/2020 and earlier. FINDINGS: Stable lines and tubes. Stable right pigtail chest tube. Trace residual right pneumothorax. Small volume right chest wall gas is stable. Stable cardiac size and mediastinal contours. No pulmonary edema. Dense retrocardiac opacity persists, with atelectasis and small pleural effusions demonstrated by CT 09/21/2020. No new pulmonary opacity. Calcified aortic atherosclerosis. IMPRESSION: 1. Stable lines and tubes. trace residual right pneumothorax and stable small volume right chest wall gas. 2. Stable ventilation. Dense retrocardiac opacity but favor hypoventilation/atelectasis in the setting of cardiomegaly. Electronically Signed   By: Genevie Ann M.D.   On: 10/04/2020 08:22   DG CHEST PORT 1 VIEW  Result  Date: 10/03/2020 CLINICAL DATA:  Central line placement. EXAM: PORTABLE CHEST 1 VIEW COMPARISON:  Same day. FINDINGS: Stable cardiomediastinal silhouette. Endotracheal and nasogastric tubes are unchanged in position. Left lung is clear. Right-sided chest tube is unchanged without evidence of pneumothorax. Stable right subclavian catheter is noted. Interval placement of right internal jugular catheter with distal tip in expected position of the SVC. Stable subcutaneous emphysema is seen over right lateral chest wall. Mild right basilar atelectasis is noted. Bony thorax is unremarkable. IMPRESSION: Interval placement of right internal jugular catheter with distal tip in expected position of the SVC. Otherwise stable support apparatus. No pneumothorax seen. Stable mild right basilar atelectasis. Electronically Signed   By: Marijo Conception M.D.   On: 10/03/2020 18:45   DG CHEST PORT 1 VIEW  Result Date: 10/03/2020 CLINICAL DATA:  Intubated, trauma, CPR EXAM: PORTABLE CHEST 1 VIEW COMPARISON:  Chest radiograph from one day prior. FINDINGS: Right subclavian central venous catheter terminates over the cavoatrial junction. Endotracheal tube tip is 1.7 cm above the carina. Enteric tube enters stomach with the tip not seen on this image. Right pigtail chest tube is stable. Stable cardiomediastinal silhouette with mild cardiomegaly. Small right apical/lateral pneumothorax, approximately 5%, stable to slightly decreased. No left pneumothorax. No pleural effusion. No overt pulmonary edema. Left retrocardiac and patchy right basilar lung opacities are similar. Mild subcutaneous emphysema in the lateral right chest wall is slightly decreased. IMPRESSION: 1. Endotracheal tube tip 1.7 cm above the carina, consider retracting 0.5 cm. Otherwise stable well-positioned support structures. 2. Stable to slightly decreased small right apical/lateral pneumothorax. 3. Stable mild cardiomegaly without overt pulmonary edema. 4. Stable left  retrocardiac  and patchy right basilar lung opacities, favor atelectasis. Electronically Signed   By: Ilona Sorrel M.D.   On: 10/03/2020 08:34   DG Chest Port 1 View  Result Date: 10/02/2020 CLINICAL DATA:  Tube placement EXAM: PORTABLE CHEST 1 VIEW COMPARISON:  October 02, 2020 FINDINGS: There is a right-sided chest tube in place. There is a small right-sided pneumothorax which has decreased in size since the prior study. The right subclavian line is well position. The endotracheal tube terminates just above the carina by approximately 1.8 cm. The enteric tube appears to extend below the left hemidiaphragm. The heart size remains enlarged. There is no left-sided pneumothorax. Bibasilar airspace disease and small bilateral pleural effusions are noted. There is subcutaneous gas along the patient's right flank. There are old healed left-sided rib fractures. IMPRESSION: 1. Lines and tubes as above. 2. Persistent but decreased right-sided pneumothorax. 3. Persistent bibasilar airspace disease and small bilateral pleural effusions. Electronically Signed   By: Constance Holster M.D.   On: 10/02/2020 19:42   DG CHEST PORT 1 VIEW  Result Date: 10/02/2020 CLINICAL DATA:  Pneumothorax. EXAM: PORTABLE CHEST 1 VIEW COMPARISON:  Radiograph earlier today, 2 hours prior. FINDINGS: Endotracheal tube tip is 2.7 cm from the carina. Enteric tube in place. Right subclavian central line tip in the SVC. Right pneumothorax is increased in size from earlier today, despite pigtail catheter projecting over the central lower lung zone. Exact pneumothorax measurement difficult due to degree of patient rotation, but likely at least 40%. Cardiomegaly with pericardial calcification, aortic tortuosity and atherosclerosis. Bilateral pleural effusions and adjacent atelectasis. Multiple right rib fractures. Increasing subcutaneous emphysema in the right chest wall. IMPRESSION: 1. Increased size of right pneumothorax from earlier this day,  likely at least 40%. Right pigtail catheter in place. 2. Increasing subcutaneous emphysema in the right chest wall. 3. Unchanged cardiomegaly, bilateral pleural effusions, and adjacent atelectasis. Electronically Signed   By: Keith Rake M.D.   On: 10/02/2020 18:29   DG CHEST PORT 1 VIEW  Result Date: 10/02/2020 CLINICAL DATA:  Chest tube placement for pneumothorax EXAM: PORTABLE CHEST 1 VIEW COMPARISON:  October 02, 2020 study obtained earlier in the day FINDINGS: Chest tube now present on the right with tip directed medially. Persistent pneumothorax on the right without appreciable change. No tension component. Endotracheal tube tip is 2.5 cm above the carina. Central catheter tip is at the cavoatrial junction. Nasogastric tube tip and side port in proximal stomach. There is persistent airspace opacity in the left lower lobe as well as ill-defined opacity in the mid and lower lung regions bilaterally. There is stable cardiomegaly with pulmonary vascularity normal. There is aortic atherosclerosis. No adenopathy. No bone lesions. IMPRESSION: Chest tube present on the right with no appreciable change in pneumothorax currently compared to earlier in the day. This circumstance warrants additional radiographic imaging in 3-4 hours to assess for diminution in pneumothorax size. If patient were to have symptoms suggesting increase in size of pneumothorax, earlier imaging would be warranted. Other tube and catheter positions as described. Airspace opacity bilaterally, most notably in the left lower lobe, persists. Stable cardiomegaly. Aortic Atherosclerosis (ICD10-I70.0). These results will be called to the ordering clinician or representative by the Radiologist Assistant, and communication documented in the PACS or [employer]. Electronically Signed   By: Lowella Grip III M.D.   On: 10/02/2020 15:35   DG CHEST PORT 1 VIEW  Result Date: 10/02/2020 CLINICAL DATA:  Acute respiratory failure with  hypoxia EXAM: PORTABLE CHEST 1  VIEW COMPARISON:  Portable exam 1352 hours compared to 10/13/2020 FINDINGS: Tip of endotracheal tube projects 2.4 cm above carina. Nasogastric tube extends into stomach. New RIGHT subclavian central venous catheter with tip projecting over SVC. Moderate RIGHT pneumothorax identified new since prior study, estimated at approximately 35% No mediastinal shift. Upper normal heart size. Normal mediastinal contours and pulmonary vascularity. Atherosclerotic calcification aorta. BILATERAL pulmonary infiltrates in mid to lower lungs greater on LEFT No pleural effusion or acute osseous findings. IMPRESSION: Moderate RIGHT pneumothorax estimated at 35% without mediastinal shift. Increased infiltrates in the mid to lower lung since previous study. Critical Value/emergent results were called by telephone at the time of interpretation on 10/02/2020 at 1417 hours to provider DR. MATTHEW HUNSUCKER , who verbally acknowledged these results. Electronically Signed   By: Lavonia Dana M.D.   On: 10/02/2020 14:18   DG Abd Portable 1V  Result Date: 10/02/2020 CLINICAL DATA:  Feeding tube placement EXAM: PORTABLE ABDOMEN - 1 VIEW COMPARISON:  October 02, 2020 FINDINGS: The enteric tube now projects over the gastric body/fundus. The tip is pointed laterally. The visualized bowel gas pattern is nonobstructive and nonspecific. 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 IMPRESSION: Feeding tube tip projects over the stomach. Electronically Signed   By: Constance Holster M.D.   On: 10/02/2020 19:44    Labs: BMET Recent Labs  Lab 09/27/2020 1208 10/10/2020 1218 10/12/2020 1222 10/02/20 0136 10/02/20 0957 10/02/20 1614 10/02/20 1644 10/03/20 0420 10/03/20 1745 10/03/20 2309 10/04/20 0440  NA 147* 146* 147* 145  --  141  --  140  --  141 139  K 5.0 5.3* 5.0 4.9  --  4.5   --  5.3*  --  5.0 4.4  CL 114*  --  114* 115*  --   --   --  109  --  105 106  CO2 22  --   --  15*  --   --   --  16*  --  18* 20*  GLUCOSE 94  --  84 168*  --   --   --  276*  --  243* 205*  BUN 40*  --  41* 45*  --   --   --  56*  --  56* 47*  CREATININE 2.27*  --  2.20* 2.76*  --   --   --  4.31*  --  4.23* 3.41*  CALCIUM 9.9  --   --  9.3  --   --   --  8.7*  --  7.9* 8.0*  PHOS  --   --   --   --  3.9  --  4.0 4.9* 5.6*  --  3.1   CBC Recent Labs  Lab 10/08/2020 1751 10/02/20 0136 10/02/20 1614 10/03/20 0420 10/04/20 0440  WBC 7.2 9.8  --  15.9* 14.3*  HGB 8.9* 8.3* 7.8* 7.8* 7.1*  HCT 30.3* 27.8* 23.0* 25.5* 22.9*  MCV 98.4 99.6  --  97.3 95.4  PLT 139* 121*  --  138* 110*    Medications:    . chlorhexidine gluconate (MEDLINE KIT)  15 mL Mouth Rinse BID  . Chlorhexidine Gluconate Cloth  6 each Topical Daily  . famotidine  20 mg Per Tube BID  . heparin  5,000 Units Subcutaneous Q8H  . hydrocortisone sod succinate (SOLU-CORTEF) inj  50 mg Intravenous Q6H  . insulin  aspart  0-15 Units Subcutaneous Q4H  . insulin glargine  10 Units Subcutaneous Daily  . mouth rinse  15 mL Mouth Rinse 10 times per day  . mycophenolate  500 mg Per Tube BID  . sodium bicarbonate  650 mg Per Tube TID  . sodium chloride flush  10 mL Intracatheter Q8H  . sodium chloride flush  10-40 mL Intracatheter Q12H  . tacrolimus  1 mg Per Tube BID      Gean Quint, MD [facility] 10/04/2020, 12:49 PM

## 2020-10-04 NOTE — Progress Notes (Signed)
[facility] Progress Note Patient Name: [name] DOB: [birth year] MRN: [id number]   Date of Service  10/04/2020  HPI/Events of Note  Mg++ 1.9, Creatinine   4.23.                                                                                                                                                                                                                                                                                                               eICU Interventions  Magnesium sulfate 2 gm iv x 1 ordered.        Kerry Kass [name] 10/04/2020, 2:37 AM

## 2020-10-04 NOTE — Progress Notes (Signed)
[facility] Progress Note Patient Name: [name] DOB: [birth year] MRN: [id number]   Date of Service  10/04/2020  HPI/Events of Note    eICU Interventions  EKG and CXR reviewed.        [name] U [name] 10/04/2020, 1:11 AM

## 2020-10-05 ENCOUNTER — Inpatient Hospital Stay (HOSPITAL_COMMUNITY): Payer: Medicare Other

## 2020-10-05 ENCOUNTER — Encounter (HOSPITAL_COMMUNITY): Payer: Medicare Other

## 2020-10-05 DIAGNOSIS — I469 Cardiac arrest, cause unspecified: Secondary | ICD-10-CM | POA: Diagnosis not present

## 2020-10-05 DIAGNOSIS — I639 Cerebral infarction, unspecified: Secondary | ICD-10-CM | POA: Diagnosis not present

## 2020-10-05 DIAGNOSIS — S270XXD Traumatic pneumothorax, subsequent encounter: Secondary | ICD-10-CM | POA: Diagnosis not present

## 2020-10-05 DIAGNOSIS — J9601 Acute respiratory failure with hypoxia: Secondary | ICD-10-CM | POA: Diagnosis not present

## 2020-10-05 LAB — RENAL FUNCTION PANEL
Albumin: 1.8 g/dL — ABNORMAL LOW (ref 3.5–5.0)
Albumin: 1.9 g/dL — ABNORMAL LOW (ref 3.5–5.0)
Anion gap: 12 (ref 5–15)
Anion gap: 11 (ref 5–15)
BUN: 34 mg/dL — ABNORMAL HIGH (ref 8–23)
BUN: 35 mg/dL — ABNORMAL HIGH (ref 8–23)
CO2: 22 mmol/L (ref 22–32)
CO2: 24 mmol/L (ref 22–32)
Calcium: 7.6 mg/dL — ABNORMAL LOW (ref 8.9–10.3)
Calcium: 7.5 mg/dL — ABNORMAL LOW (ref 8.9–10.3)
Chloride: 104 mmol/L (ref 98–111)
Chloride: 103 mmol/L (ref 98–111)
Creatinine, Ser: 2.24 mg/dL — ABNORMAL HIGH (ref 0.61–1.24)
Creatinine, Ser: 1.8 mg/dL — ABNORMAL HIGH (ref 0.61–1.24)
GFR, Estimated: 30 mL/min — ABNORMAL LOW (ref >60)
GFR, Estimated: 39 mL/min — ABNORMAL LOW (ref >60)
Glucose, Bld: 225 mg/dL — ABNORMAL HIGH (ref 70–99)
Glucose, Bld: 346 mg/dL — ABNORMAL HIGH (ref 70–99)
Phosphorus: 2 mg/dL — ABNORMAL LOW (ref 2.5–4.6)
Phosphorus: 1.5 mg/dL — ABNORMAL LOW (ref 2.5–4.6)
Potassium: 3.6 mmol/L (ref 3.5–5.1)
Potassium: 3.8 mmol/L (ref 3.5–5.1)
Sodium: 138 mmol/L (ref 135–145)
Sodium: 138 mmol/L (ref 135–145)

## 2020-10-05 LAB — CBC
HCT: 23.9 % — ABNORMAL LOW (ref 39.0–52.0)
Hemoglobin: 7 g/dL — ABNORMAL LOW (ref 13.0–17.0)
MCH: 28.3 pg (ref 26.0–34.0)
MCHC: 29.3 g/dL — ABNORMAL LOW (ref 30.0–36.0)
MCV: 96.8 fL (ref 80.0–100.0)
Platelets: 78 10*3/uL — ABNORMAL LOW (ref 150–400)
RBC: 2.47 MIL/uL — ABNORMAL LOW (ref 4.22–5.81)
RDW: 16.5 % — ABNORMAL HIGH (ref 11.5–15.5)
WBC: 13.3 10*3/uL — ABNORMAL HIGH (ref 4.0–10.5)
nRBC: 0.8 % — ABNORMAL HIGH (ref 0.0–0.2)

## 2020-10-05 LAB — GLUCOSE, CAPILLARY
Glucose-Capillary: 207 mg/dL — ABNORMAL HIGH (ref 70–99)
Glucose-Capillary: 205 mg/dL — ABNORMAL HIGH (ref 70–99)
Glucose-Capillary: 282 mg/dL — ABNORMAL HIGH (ref 70–99)
Glucose-Capillary: 322 mg/dL — ABNORMAL HIGH (ref 70–99)
Glucose-Capillary: 322 mg/dL — ABNORMAL HIGH (ref 70–99)

## 2020-10-05 LAB — POTASSIUM: Potassium: 3.7 mmol/L (ref 3.5–5.1)

## 2020-10-05 LAB — MAGNESIUM: Magnesium: 2.3 mg/dL (ref 1.7–2.4)

## 2020-10-05 MED ORDER — STROKE: EARLY STAGES OF RECOVERY BOOK
Freq: Once | Status: AC
  Filled 2020-10-05: qty 1

## 2020-10-05 MED ORDER — ASPIRIN 81 MG PO CHEW
81.0000 mg | CHEWABLE_TABLET | Freq: Every day | ORAL | Status: DC
  Administered 2020-10-05 – 2020-10-12 (×8): 81 mg
  Filled 2020-10-05 (×8): qty 1

## 2020-10-05 MED ORDER — INSULIN GLARGINE 100 UNIT/ML ~~LOC~~ SOLN
15.0000 [IU] | Freq: Every day | SUBCUTANEOUS | Status: DC
  Administered 2020-10-06 – 2020-10-07 (×2): 15 [IU] via SUBCUTANEOUS
  Filled 2020-10-05 (×2): qty 0.15

## 2020-10-05 MED ORDER — SODIUM CHLORIDE 0.9 % IV SOLN
2.0000 g | INTRAVENOUS | Status: DC
  Administered 2020-10-05 – 2020-10-07 (×3): 2 g via INTRAVENOUS
  Filled 2020-10-05 (×3): qty 20

## 2020-10-05 MED ORDER — INSULIN GLARGINE 100 UNIT/ML ~~LOC~~ SOLN
5.0000 [IU] | Freq: Once | SUBCUTANEOUS | Status: AC
  Administered 2020-10-05: 5 [IU] via SUBCUTANEOUS
  Filled 2020-10-05: qty 0.05

## 2020-10-05 NOTE — Progress Notes (Signed)
Unable to connect family with elink video chat

## 2020-10-05 NOTE — Progress Notes (Signed)
[facility] Progress Note Patient Name: [name] DOB: 1946/03/05 MRN: [id number]   Date of Service  10/05/2020  HPI/Events of Note  EKG reviewed.  A fib, HR 76, qtc ok.  A. Fib, Paroxysmal: Previous hx, not on AC. Stable at this point.  -Holding off on anticoagulation even with possible embolic CVAs given his petechial blood associated with one of his occipital cerebral infarcts   eICU Interventions  - get K/Mag and replace if low. Continue care     Intervention Category Intermediate Interventions: Other:;Arrhythmia - evaluation and management  Elmer Sow 10/05/2020, 12:40 AM

## 2020-10-05 NOTE — Progress Notes (Addendum)
Neurology Progress Note   S:// Patient seen and examined this morning. On CRRT.  Remains intubated.   O:// Current vital signs: BP 136/61 (BP Location: Left Arm)    Pulse 74    Temp 98.3 F (36.8 C) (Axillary)    Resp (!) 25    Ht 5' 11"  (1.803 m)    Wt 86.6 kg    SpO2 100%    BMI 26.63 kg/m  Vital signs in last 24 hours: Temp:  [97.5 F (36.4 C)-100.22 F (37.9 C)] 98.3 F (36.8 C) (01/16 0732) Pulse Rate:  [70-90] 74 (01/16 0800) Resp:  [0-28] 25 (01/16 0800) BP: (80-146)/(44-71) 136/61 (01/16 0800) SpO2:  [94 %-100 %] 100 % (01/16 0800) FiO2 (%):  [40 %] 40 % (01/16 0800) Weight:  [86.6 kg] 86.6 kg (01/16 0500) General: Intubated, no sedation HEENT: Normocephalic atraumatic with no chemosis on the left bradycardia with subconjunctival hematoma and bruising with sutures on the left forehead. Lungs: Vented CVS: Regular rate rhythm Abdomen soft nondistended Extremities edematous all fours with elevators arms covered with dressing. Neurological exam Nonverbal, opens eyes to loud voice, it appeared that he did follow some command on the right upper extremity. Cranial nerves: Pupils equal round and reactive to light sluggishly bilaterally, eyes are mildly disconjugate, does not blink to threat from either side, corneals present bilaterally. Motor examination: It appears that he did follow some commands with attempting to move the right upper extremity.  To noxious stimulation, mild flicker movement noticed in both upper extremities.  No frank localization but his arms are wrapped up in heavy bandages and are extremely edematous.  To the noxious stimulation in the lower extremities, soft grimace appreciated. Sensory exam: As above  Medications  Current Facility-Administered Medications:     prismasol BGK 4/2.5 infusion, , CRRT, Continuous, Rolla Flatten, [facility], Last Rate: 500 mL/hr at 10/05/20 0414, New Bag at 10/05/20 0414    prismasol BGK 4/2.5 infusion, , CRRT,  Continuous, Rolla Flatten, [facility], Last Rate: 300 mL/hr at 10/05/20 0748, New Bag at 10/05/20 0748   0.9 %  sodium chloride infusion, , Intravenous, PRN, Collene Gobble, MD, Stopped at 10/04/20 1611   acetaminophen (TYLENOL) tablet 650 mg, 650 mg, Oral, Q6H PRN, Hunsucker, Bonna Gains, MD, 650 mg at 09/26/2020 1757   ceFEPIme (MAXIPIME) 2 g in sodium chloride 0.9 % 100 mL IVPB, 2 g, Intravenous, Q12H, Priscella Mann, RPH, Stopped at 10/05/20 0349   chlorhexidine gluconate (MEDLINE KIT) (PERIDEX) 0.12 % solution 15 mL, 15 mL, Mouth Rinse, BID, Hunsucker, Bonna Gains, MD, 15 mL at 10/05/20 4332   Chlorhexidine Gluconate Cloth 2 % PADS 6 each, 6 each, Topical, Daily, Hunsucker, Bonna Gains, MD, 6 each at 10/04/20 1355   docusate (COLACE) 50 MG/5ML liquid 100 mg, 100 mg, Per Tube, BID PRN, Hunsucker, Bonna Gains, MD, 100 mg at 10/04/20 2203   famotidine (PEPCID) tablet 20 mg, 20 mg, Per Tube, Daily, Byrum, Rose Fillers, MD   feeding supplement (VITAL AF 1.2 CAL) liquid 1,000 mL, 1,000 mL, Per Tube, Continuous, Hunsucker, Bonna Gains, MD, Last Rate: 75 mL/hr at 10/05/20 0600, 1,000 mL at 10/05/20 0600   fentaNYL (SUBLIMAZE) bolus via infusion 25 mcg, 25 mcg, Intravenous, Q15 min PRN, Hunsucker, Bonna Gains, MD, 25 mcg at 10/04/20 0551   fentaNYL 2553mg in NS 2567m(1057mml) infusion-PREMIX, 25-200 mcg/hr, Intravenous, Continuous, Hunsucker, MatBonna GainsD, Stopped at 10/05/20 0710   heparin injection 1,000-6,000 Units, 1,000-6,000 Units, CRRT, PRN, SinGean QuintD, 1,300  Units at 10/03/20 1826   heparin injection 5,000 Units, 5,000 Units, Subcutaneous, Q8H, Hunsucker, Bonna Gains, MD, 5,000 Units at 10/05/20 0509   hydrocortisone sodium succinate (SOLU-CORTEF) 100 MG injection 50 mg, 50 mg, Intravenous, Q6H, Hunsucker, Bonna Gains, MD, 50 mg at 10/05/20 0509   insulin aspart (novoLOG) injection 0-15 Units, 0-15 Units, Subcutaneous, Q4H, Jose Persia, MD, 5 Units at 10/05/20 0815   insulin glargine  (LANTUS) injection 10 Units, 10 Units, Subcutaneous, Daily, Jose Persia, MD, 10 Units at 10/04/20 7425   MEDLINE mouth rinse, 15 mL, Mouth Rinse, 10 times per day, Hunsucker, Bonna Gains, MD, 15 mL at 10/05/20 0516   midazolam (VERSED) injection 2 mg, 2 mg, Intravenous, Q4H PRN, Hunsucker, Bonna Gains, MD   mycophenolate (CELLCEPT) oral suspension 50 mg/mL, 500 mg, Per Tube, BID, Hunsucker, Bonna Gains, MD, 500 mg at 10/04/20 2203   norepinephrine (LEVOPHED) 16 mg in 257mL premix infusion, 0-40 mcg/min, Intravenous, Titrated, Hunsucker, Bonna Gains, MD, Last Rate: 2.81 mL/hr at 10/05/20 0800, 3 mcg/min at 10/05/20 0800   ondansetron (ZOFRAN) injection 4 mg, 4 mg, Intravenous, Q6H PRN, Hunsucker, Bonna Gains, MD   polyethylene glycol (MIRALAX / GLYCOLAX) packet 17 g, 17 g, Per Tube, Daily PRN, Hunsucker, Bonna Gains, MD   prismasol BGK 4/2.5 infusion, , CRRT, Continuous, Rolla Flatten, [facility], Last Rate: 1,500 mL/hr at 10/05/20 0810, New Bag at 10/05/20 0810   sodium bicarbonate tablet 650 mg, 650 mg, Per Tube, TID, Hunsucker, Bonna Gains, MD, 650 mg at 10/04/20 2203   sodium chloride flush (NS) 0.9 % injection 10 mL, 10 mL, Intracatheter, Q8H, Davis, Whitney F, NP, 10 mL at 10/05/20 0750   sodium chloride flush (NS) 0.9 % injection 10-40 mL, 10-40 mL, Intracatheter, Q12H, Hunsucker, Bonna Gains, MD, 10 mL at 10/04/20 2203   sodium chloride flush (NS) 0.9 % injection 10-40 mL, 10-40 mL, Intracatheter, PRN, Hunsucker, Bonna Gains, MD   tacrolimus (PROGRAF) 1 mg/mL oral suspension 1 mg, 1 mg, Per Tube, BID, Hunsucker, Bonna Gains, MD, 1 mg at 10/04/20 2203   vasopressin (PITRESSIN) 20 Units in sodium chloride 0.9 % 100 mL infusion-*FOR SHOCK*, 0.03 Units/min, Intravenous, Continuous, Hunsucker, Bonna Gains, MD, Stopped at 10/04/20 1001 Labs CBC    Component Value Date/Time   WBC 13.3 (H) 10/05/2020 0311   RBC 2.47 (L) 10/05/2020 0311   HGB 7.0 (L) 10/05/2020 0311   HCT 23.9 (L) 10/05/2020 0311   PLT  78 (L) 10/05/2020 0311   MCV 96.8 10/05/2020 0311   MCH 28.3 10/05/2020 0311   MCHC 29.3 (L) 10/05/2020 0311   RDW 16.5 (H) 10/05/2020 0311   LYMPHSABS 0.3 (L) 10/04/2020 1619   MONOABS 0.6 10/04/2020 1619   EOSABS 0.0 10/04/2020 1619   BASOSABS 0.0 10/04/2020 1619    CMP     Component Value Date/Time   NA 138 10/05/2020 0311   K 3.6 10/05/2020 0311   CL 104 10/05/2020 0311   CO2 22 10/05/2020 0311   GLUCOSE 225 (H) 10/05/2020 0311   BUN 34 (H) 10/05/2020 0311   CREATININE 2.24 (H) 10/05/2020 0311   CALCIUM 7.6 (L) 10/05/2020 0311   PROT 4.7 (L) 10/03/2020 2309   ALBUMIN 1.8 (L) 10/05/2020 0311   AST 56 (H) 10/03/2020 2309   ALT 30 10/03/2020 2309   ALKPHOS 94 10/03/2020 2309   BILITOT 0.8 10/03/2020 2309   GFRNONAA 30 (L) 10/05/2020 0311     Imaging I have reviewed images in epic and the results pertinent to this consultation  are: MRI brain with scattered embolic strokes and questionably diffusion-weighted signal abnormality in the cortex primarily in the posterior aspects of both hemispheres with no clearly confirmed finding on T2-weighted sequences and without edema- possible global hypoxic injury. EEG with no seizures or epileptiform discharges, diffuse encephalopathy.  CTA neck 1/12 1. Negative for large vessel occlusion. Widespread atherosclerosis in the head and neck. These results were discussed with Dr. Reather Laurence on 09/25/2020 at 1130 hours.  2. 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 But no other significant arterial stenosis in the head or neck. Generalized arterial tortuosity.   Assessment: 75 year old man status post cardiac arrest found responsible for PML by his wife.  Intubated upon review to the hospital. MRI with scattered acute to early subacute cerebral and cerebellar infarcts.  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 EEG suggestive of profound diffuse encephalopathy No seizures on EEG Multiple toxic/metabolic derangements including that of glucose, creatinine, leukocytosis and anemia. Recently started on CRRT Exam suggestive of intact brainstem and subcortical function. Given his advanced age and multiple comorbidities, progress neurologically will be slow.  I am not certain if I can truly prognosticate very well at this time given reports of confounding factors such as a toxic metabolic derangements but he does have intact brainstem and some meaningful cortical function, so it is reasonable to give him some time before making any long-term decisions at this time.  Other differentials - PRES due to immunosuppression  Impression: -Acute ischemic strokes-likely embolic versus secondary to CPR -Toxic metabolic encephalopathy -Hypoxic ischemic encephalopathy -Evaluate for anoxic/hypoxic brain injury -Given history of CellCept and Prograf- other differentials include posterior reversible encephalopathy syndrome  Recommendations: -Supportive care per primary team as you are -Correction of metabolic derangements per primary team as you are If A. fib is detected or anticoagulation is indicated for any cardiac cause, I would wait at least good 5 to 7 days given multiple embolic infarcts because there is high risk of hemorrhagic transformation.  Even then, heparin drip should be started-stroke protocol no bolus. -No need for antiepileptics -I would also look at changing cefepime to another antibiotic-for concern for cefepime toxicity although spot EEG was not concerning for that. -He is a renal transplant patient.  He is also on CellCept and Prograf-that can in theory also cause posterior reversible encephalopathy syndrome-we can try to hold Prograf (Tacrolimus) as this has been associated with neurotoxicity as well as PRES, and see if he does any better. -ASA for stroke ppx -Check lipid panel and A1c  to complete stroke w/u. Also MRA head when  Stable (dont see it changing any plans for now so no need to rush given he is on CRRT) Discussed with Dr. Lamonte Sakai over the phone  Neurology will follow with you.  -- Amie Portland, MD Neurologist [facility] Pager: [phone]  CRITICAL CARE ATTESTATION Performed by: Amie Portland, MD Total critical care time: 30 minutes Critical care time was exclusive of separately billable procedures and treating other patients and/or supervising APPs/Residents/Students Critical care was necessary to treat or prevent imminent or life-threatening deterioration due to acute ischemic strokes, This patient is critically ill and at significant risk for neurological worsening and/or death and care requires constant monitoring. Critical care was time spent personally by me on the following activities: development of treatment plan with patient and/or surrogate as well as nursing, discussions with consultants, evaluation of patient's response  to treatment, examination of patient, obtaining history from patient or surrogate, ordering and performing treatments and interventions, ordering and review of laboratory studies, ordering and review of radiographic studies, pulse oximetry, re-evaluation of patient's condition, participation in multidisciplinary rounds and medical decision making of high complexity in the care of this patient.

## 2020-10-05 NOTE — Progress Notes (Signed)
NAME:  [name], MRN:  [id number], DOB:  [birth year], LOS: 4 ADMISSION DATE:  09/21/2020, CONSULTATION DATE:  1/12 REFERRING MD:  Dr. Rogene Houston, CHIEF COMPLAINT:  Cardiac arrest   Brief History:  75 year old male with renal transplant history admitted 1/12 after PEA arrest 12 minutes.  History of Present Illness:  75 year old male with PMH as below, which is significant for CKD (previously on HD, but is s/p renal transplant in 2005 at [facility]), AF not on [facility], who presented to [name] after cardiac arrest athome 1/12. Apparently 1/11 in the evening hours he developed chest pain and dyspnea, but proceeded to go to bed. Then 1/12 AM his wife awoke around 0930 and found him to be unresponsive and foaming at the mouth. He was still breathing. As she called EMS he fell to the floor. Upon EMS arrival the patient was pulseless and ACLS was initiated. CPR was performed for approximately 12 minutes with two doses of epinephrine. He suffered injury to his L periorbit at some point during all this, raising concern for head injury and he was treated as a level one trauma in ED. Upon arrival he was unresponsive and was immediately intubated. He was emergently taken for pan-CT scan. No operable injury was identified. PCCM asked to admit.   Past Medical History:   Paroxysmal AF (no AC)  ESRD S/p Renal transplant x2  HFrEF  HTN  HLD  T2DM  PAD  Significant Hospital Events:  1/12 admit for PEA arrest 1/13 Pneumothorax development with pigtail catheter placed. Patient had increasing pressor requirements.   Consults:  Neurosurgery (S/o) Trauma surgery (S/o) Nephrology Neurology   Procedures:  CVL R subclavian 1/12 > ETT 1/12 > Pigtail catheter 1/14 >  R IJ HD cath 1/14 >>   Significant Diagnostic Tests:  1/12 CTA head > no large vessel occlusion. Mod-severe stenosis of the dominant R vert.  1/12 CT Maxillofacial > Subtle fracture of the posterior wall of the right maxillary sinus is  new since September and likely acute. Trace fluid within the sinus. Hematoma/contusion involving the right parotid gland and regional subcutaneous tissues. Left forehead and periorbital hematoma without underlying fracture. 1/12 CT chest/abd/pelv > Numerous acute rib fractures: Right ribs 1 through 7 (with rib 2 through 4 flail segment) and left ribs 2 through 7. Mild intercostal hematoma, but no pneumothorax, hemothorax or pulmonary contusion. Low-density layering pleural effusions appear mildly increased from last month. Chronic atelectasis. MRI Brain 1/14 >> scattered small acute to early subacute cerebral and cerebellar infarcts, few scattered chronic cerebral microhemorrhages bilaterally, small amount of petechial blood associated with small left occipital infarct, possible more global hypoxic ischemic injury given subtle diffusion-weighted signal hyperintensity involving cortex posteriorly  Micro Data:  1/12 Urine Cultures > Negative 1/13 Blood Cultures >   Antimicrobials:  Ceftriaxone 1/13 >> 1/14; 1/16 >>  Cefepime 1/14 >> 1/16  Interim History / Subjective:   Low-dose fentanyl Remains on norepinephrine, currently 3 I/O+ 3.4 L total CVVHD is running   Objective   Blood pressure (!) 113/48, pulse 71, temperature 98.3 F (36.8 C), temperature source Axillary, resp. rate (!) 24, height 5\' 11"  (1.803 m), weight 86.6 kg, SpO2 100 %.    Vent Mode: PRVC FiO2 (%):  [40 %] 40 % Set Rate:  [24 bmp] 24 bmp Vt Set:  [600 mL] 600 mL PEEP:  [5 cmH20] 5 cmH20 Plateau Pressure:  [18 cmH20-23 cmH20] 22 cmH20   Intake/Output Summary (Last 24 hours) at  10/05/2020 1050 Last data filed at 10/05/2020 1001 Gross per 24 hour  Intake 2375.27 ml  Output 2696 ml  Net -320.73 ml   Filed Weights   10/03/20 0500 10/04/20 0452 10/05/20 0500  Weight: 90.3 kg 89.5 kg 86.6 kg   Gen: Critically ill-appearing man, laying in bed HEENT: ET tube in good position, laceration above the left eye, left eye  edema and hemorrhage present, unchanged Neck: Right IJ HD catheter, right subclavian catheter Lungs coarse bilaterally, no wheeze CV: Distant, regular, no murmur Abdomen: Nondistended, hypoactive bowel sounds Extremities: No lower extremity edema Neuro: Opens eyes to voice, pupils equal and react, corneals present, may be attempting to move his right upper extremity to command.  Flicker of movement upper extremities to pain Skin: Abrasions bilateral upper extremities with dressings  Resolved Hospital Problem list     Assessment & Plan:   # PEA cardiac arrest: Witnessed, no bystander CPR. 12 min ACLS. Initially suspected to be cardiogenic in the setting of pulmonary edema. TTE shows recovered EF with no new wall motion abnormalities to explain.  Questionable purposeful movement at presentation, TTM not pursued.   # Shock: Improving. Mixed picture. Concerning for hypovolemia, also possible septic shock. No clear source at this time -Back on low-dose norepinephrine, wean as able -Continue stress dose steroids -On cefepime for possible HCAP.  Plan to change cefepime back to ceftriaxone on 1/16 given potential neurologic effects -Follow culture data   # Acute hypoxic respiratory failure: Pulmonary edema with possible aspiration event during arrest. # Right Pneumothorax: Secondary to numerous rib fractures versus subclavian central line complication. Pigtail catheter in place. Repeat CXR this AM shows improvement in pneumothorax.  -Continue PRVC, could consider some PSV but not an extubation candidate due to his encephalopathy -Continue bronchial hygiene -Right pigtail catheter in place and to suction -Follow chest x-ray  # Acute Renal Failure:  # CKD:  Hx renal transplant x 2 (1983, 2005 and now on Cellcept, Prograf, Prednisone). # Hypomagnesemia  # Hyperkalemia  -Appreciate nephrology evaluation and management -Continue CVVHD, started on 1/14.  Plan to continue as we follow  neurological progress-prognosis -Will continue CellCept, stop Prograf on 1/16 as this can have neurological effects, can be associated with PRES   # Acute Encephalopathy, concerning for anoxic injury:  #Small acute subcortical bilateral scattered infarcts, possibly embolic -Some petechial blood present on MRI brain in association with infarcts. Continue to hold anticoagulation at this time -Appreciate neurology evaluation and follow-up for prognostication -Cefepime changed, tacrolimus discontinued on 1/16 per recommendations -Address any metabolic contributors to encephalopathy  -Minimize sedation as able  # A. Fib, Paroxysmal: Previous hx, not on AC. Stable at this point.  -Continue to hold off on anticoagulation even with possible embolic CVAs given his petechial blood associated with one of his occipital cerebral infarcts  # HFrEF with recovered EF: LVEF 40-45% with grade 3 DD. Repeat TTE this admission shows improvement in EF however, evidence of severe LVH with cavity obliteration during systole. Today, he is volume overloaded. -Volume status and volume removal via CVVHD as blood pressure will tolerate  # T2DM -Sliding scale insulin as per protocol  # R, L rib fx: flail segments by CT on the right # R maxillary sinus wall fracture -Pain management  Best practice (evaluated daily)  Diet: Tube Feeds  Pain/Anxiety/Delirium protocol (if indicated): Fentanyl VAP protocol (if indicated): Per protocol DVT prophylaxis: SQH GI prophylaxis: Pepcid Glucose control: SSI Mobility: BR Disposition: FULL  Goals of Care:  Last  date of multidisciplinary goals of care discussion: 1/12 Family and staff present:  Summary of discussion: Follow up goals of care discussion due: 1/19 Code Status: FULL code  Labs   CBC: Recent Labs  Lab 10/02/20 0136 10/02/20 1614 10/03/20 0420 10/04/20 0440 10/04/20 1619 10/05/20 0311  WBC 9.8  --  15.9* 14.3* 10.6* 13.3*  NEUTROABS  --   --   --    --  9.6*  --   HGB 8.3* 7.8* 7.8* 7.1* 7.1* 7.0*  HCT 27.8* 23.0* 25.5* 22.9* 24.1* 23.9*  MCV 99.6  --  97.3 95.4 97.2 96.8  PLT 121*  --  138* 110* 103* 78*    Basic Metabolic Panel: Recent Labs  Lab 10/02/20 1644 10/03/20 0420 10/03/20 1745 10/03/20 2309 10/04/20 0440 10/04/20 1619 10/05/20 0058 10/05/20 0311  NA  --  140  --  141 139 139  --  138  K  --  5.3*  --  5.0 4.4 3.8 3.7 3.6  CL  --  109  --  105 106 104  --  104  CO2  --  16*  --  18* 20* 21*  --  22  GLUCOSE  --  276*  --  243* 205* 122*  --  225*  BUN  --  56*  --  56* 47* 39*  --  34*  CREATININE  --  4.31*  --  4.23* 3.41* 2.71*  --  2.24*  CALCIUM  --  8.7*  --  7.9* 8.0* 7.7*  --  7.6*  MG 1.8 1.8 1.9 1.9  --   --  2.3  --   PHOS 4.0 4.9* 5.6*  --  3.1 2.6  --  2.0*   GFR: Estimated Creatinine Clearance: 31.3 mL/min (A) (by C-G formula based on SCr of 2.24 mg/dL (H)). Recent Labs  Lab 09/22/2020 1205 09/27/2020 1208 10/03/20 0420 10/04/20 0440 10/04/20 1619 10/05/20 0311  WBC  --    < > 15.9* 14.3* 10.6* 13.3*  LATICACIDVEN 2.1*  --   --   --   --   --    < > = values in this interval not displayed.    Liver Function Tests: Recent Labs  Lab 10/17/2020 1208 10/02/20 0930 10/03/20 2309 10/04/20 0440 10/04/20 1619 10/05/20 0311  AST 50* 48* 56*  --   --   --   ALT 22 22 30   --   --   --   ALKPHOS 93 72 94  --   --   --   BILITOT 0.6 1.2 0.8  --   --   --   PROT 5.3* 4.5* 4.7*  --   --   --   ALBUMIN 2.9* 2.4* 2.2* 2.1* 2.0* 1.8*   No results for input(s): LIPASE, AMYLASE in the last 168 hours. No results for input(s): AMMONIA in the last 168 hours.  ABG    Component Value Date/Time   PHART 7.298 (L) 10/05/2020 1218   PCO2ART 49.4 (H) 09/28/2020 1218   PO2ART 85 09/30/2020 1218   HCO3 18.1 (L) 10/02/2020 1614   TCO2 19 (L) 10/02/2020 1614   ACIDBASEDEF 7.0 (H) 10/02/2020 1614   O2SAT 54.0 10/02/2020 1614     Coagulation Profile: Recent Labs  Lab 10/06/2020 1208  INR 1.2     Cardiac Enzymes: No results for input(s): CKTOTAL, CKMB, CKMBINDEX, TROPONINI in the last 168 hours.  HbA1C: Hgb A1c MFr Bld  Date/Time Value Ref Range Status  10/02/2020 06:50  PM 6.1 (H) 4.8 - 5.6 % Final    Comment:    (NOTE) Pre diabetes:          5.7%-6.4%  Diabetes:              >6.4%  Glycemic control for   <7.0% adults with diabetes     CBG: Recent Labs  Lab 10/04/20 1538 10/04/20 1932 10/04/20 2305 10/05/20 0316 10/05/20 0802  GLUCAP 98 138* 160* 207* 205*   Independent critical care time 33 minutes   Baltazar Apo, MD, PhD 10/05/2020, 10:50 AM [facility] [phone] or if no answer [phone]

## 2020-10-05 NOTE — Progress Notes (Signed)
SLP Cancellation Note  Patient Details Name: [name] MRN: [id number] DOB: 07/01/1946   Cancelled evaluation:     SLP order received. Pt is currently still on the vent. Will follow up for evaluation post-extubation.      [name], [name] CCC/SLP Acute Rehabilitation Services Office number [phone] Pager [phone]   [name] Laurice 10/05/2020, 9:07 AM

## 2020-10-05 NOTE — Progress Notes (Signed)
[facility] Progress Note    Assessment:  1. PEA cardiac arrest, out of hospital. CPR 12 min. Etiology unclear. TTM deferred 2. Shock w/ pressor requirements 3. AHRF, vent dependent 4. AKI, worsening, anuric. CRRT start date 10/03/20, line placement via CCM 1/14 5. CKD4, h/o renal transplant x 2 ('83 & '05). Baseline Cr ~2-2.2. goal tac 4-6 6. Anion gap metabolic acidosis 7. Hyperkalemia, mild 8. Encephalopathy, hypoxic and toxic metabolic 9. 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 Uncontrolled DM2 w/ hyperglycemia, mgmt per primary service  Plan: -essentially anuric now unfortunately -continue with CRRT for now. UF limited by BP/shock. Target net neg 50-100cc/hr as tolerated -a;; 4k/2.5cal bag -too soon for prognostication, appreciate neuro's assistance. Concern for PRES, can hold tac. CKD4 to begin with now with severe AKI, covered with stress dose steroids, reasonable to trial him off tacrolimus. At this point, I am suspecting he won't have much renal recovery. Will d/c his tacrolimus -tac trought 1/13 pending, now on cellcept and stress dose steroids -Avoid nephrotoxic medications including NSAIDs and iodinated intravenous contrast exposure unless the latter is absolutely indicated.  Preferred narcotic agents for pain control are hydromorphone, fentanyl, and methadone. Morphine should not be used. Avoid Baclofen and avoid oral sodium phosphate and magnesium citrate based laxatives / bowel preps. Continue strict Input and Output monitoring. Will monitor the patient closely with you and intervene or adjust therapy as indicated by changes in clinical status/labs   Subjective:   Seen on crrt. 30cc uop. Still requiring pressors.    Objective:   BP (!) 125/59   Pulse 73   Temp 98.3 F (36.8 C) (Axillary)   Resp (!) 25   Ht _0  (1.803 m)   Wt 86.6 kg   SpO2 100%   BMI 26.63 kg/m   Intake/Output Summary (Last 24 hours) at 10/05/2020 1110 Last data  filed at 10/05/2020 1100 Gross per 24 hour  Intake 2363.02 ml  Output 2524 ml  Net -160.98 ml   Weight change: -2.9 kg  Physical Exam: Gen:ill appearing CVS:reg rate Resp:intubated Abd:nd Ext: anasarca Neuro: sedated, unresponsive Access: rij temp line c/d/i  Imaging: MR BRAIN W WO CONTRAST  Addendum Date: 10/03/2020   ADDENDUM REPORT: 10/03/2020 14:08 ADDENDUM: The study was reviewed by telephone with Dr. Rory Percy on 10/03/2020 at 1400 hours. There is the suggestion of subtle FLAIR and diffusion-weighted signal hyperintensity involving cortex primarily in the posterior aspects of both cerebral hemispheres, however this is not clearly confirmed on standard T2 weighted sequences and there is no gyral edema. Findings are indeterminate for a more global hypoxic-ischemic injury. Electronically Signed   By: Logan Bores M.D.   On: 10/03/2020 14:08   Result Date: 10/03/2020 CLINICAL DATA:  Cardiac arrest.  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 EXAM: MRI HEAD WITHOUT AND WITH CONTRAST TECHNIQUE: Multiplanar, multiecho pulse sequences of the brain and surrounding structures were obtained without and with intravenous contrast. CONTRAST:  29m GADAVIST GADOBUTROL 1 MMOL/ML IV SOLN COMPARISON:  Head and maxillofacial CTs 10/19/2020 and MRI 05/28/2020 FINDINGS: Brain: There are scattered small acute to early subacute cortical and subcortical infarcts in the frontal, parietal, and occipital lobes bilaterally with small infarcts also noted in the left basal ganglia and in both cerebellar hemispheres. A small amount of petechial blood products are associated with a small left occipital infarct. There are a few scattered chronic cerebral microhemorrhages bilaterally. There is mild cerebral atrophy. No mass, midline shift, or extra-axial fluid collection is identified. Vascular: Major  intracranial vascular flow voids are preserved. Skull and upper cervical spine: Unremarkable bone marrow signal. Sinuses/Orbits:  Unremarkable orbits. Mild mucosal thickening in the paranasal sinuses. Small bilateral mastoid effusions. Other: Partially visualized right facial and left periorbital soft tissue swelling with hematoma is demonstrated on the prior CT. IMPRESSION: Scattered small acute to early subacute cerebral and cerebellar infarcts. Electronically Signed: By: Logan Bores M.D. On: 10/03/2020 13:46   DG Chest Port 1 View  Result Date: 10/05/2020 CLINICAL DATA:  75 year old male status post fall, PEA arrest. History of renal failure, renal transplants. Right pneumothorax treated with chest tube. EXAM: PORTABLE CHEST 1 VIEW COMPARISON:  Portable chest 10/04/2020 and earlier. FINDINGS: Portable AP upright view at 0506 hours. Stable lines and tubes. No pneumothorax is evident today. Stable cardiomegaly and mediastinal contours. Continued dense retrocardiac opacity. No pulmonary edema. Paucity of bowel gas. IMPRESSION: 1. Stable lines and tubes. No pneumothorax is evident today. 2. Cardiomegaly with continued dense lung base opacity suggesting lower lobe collapse or consolidation. Electronically Signed   By: Genevie Ann M.D.   On: 10/05/2020 07:36   DG CHEST PORT 1 VIEW  Result Date: 10/04/2020 CLINICAL DATA:  75 year old male status post fall, PEA arrest. History of renal failure, renal transplants. Right pneumothorax treated with chest tube. EXAM: PORTABLE CHEST 1 VIEW COMPARISON:  Portable chest 10/03/2020 and earlier. FINDINGS: Stable lines and tubes. Stable right pigtail chest tube. Trace residual right pneumothorax. Small volume right chest wall gas is stable. Stable cardiac size and mediastinal contours. No pulmonary edema. Dense retrocardiac opacity persists, with atelectasis and small pleural effusions demonstrated by CT 09/27/2020. No new pulmonary opacity. Calcified aortic atherosclerosis. IMPRESSION: 1. Stable lines and tubes. trace residual right pneumothorax and stable small volume right chest wall gas. 2. Stable  ventilation. Dense retrocardiac opacity but favor hypoventilation/atelectasis in the setting of cardiomegaly. Electronically Signed   By: Genevie Ann M.D.   On: 10/04/2020 08:22   DG CHEST PORT 1 VIEW  Result Date: 10/03/2020 CLINICAL DATA:  Central line placement. EXAM: PORTABLE CHEST 1 VIEW COMPARISON:  Same day. FINDINGS: Stable cardiomediastinal silhouette. Endotracheal and nasogastric tubes are unchanged in position. Left lung is clear. Right-sided chest tube is unchanged without evidence of pneumothorax. Stable right subclavian catheter is noted. Interval placement of right internal jugular catheter with distal tip in expected position of the SVC. Stable subcutaneous emphysema is seen over right lateral chest wall. Mild right basilar atelectasis is noted. Bony thorax is unremarkable. IMPRESSION: Interval placement of right internal jugular catheter with distal tip in expected position of the SVC. Otherwise stable support apparatus. No pneumothorax seen. Stable mild right basilar atelectasis. Electronically Signed   By: Marijo Conception M.D.   On: 10/03/2020 18:45    Labs: [employer] Recent Labs  Lab 10/11/2020 1208 09/25/2020 1218 10/13/2020 1222 10/02/20 0136 10/02/20 0957 10/02/20 1614 10/02/20 1644 10/03/20 0420 10/03/20 1745 10/03/20 2309 10/04/20 0440 10/04/20 1619 10/05/20 0058 10/05/20 0311  NA 147*   < > 147* 145  --  141  --  140  --  141 139 139  --  138  K 5.0   < > 5.0 4.9  --  4.5  --  5.3*  --  5.0 4.4 3.8 3.7 3.6  CL 114*  --  114* 115*  --   --   --  109  --  105 106 104  --  104  CO2 22  --   --  15*  --   --   --  16*  --  18* 20* 21*  --  22  GLUCOSE 94  --  84 168*  --   --   --  276*  --  243* 205* 122*  --  225*  BUN 40*  --  41* 45*  --   --   --  56*  --  56* 47* 39*  --  34*  CREATININE 2.27*  --  2.20* 2.76*  --   --   --  4.31*  --  4.23* 3.41* 2.71*  --  2.24*  CALCIUM 9.9  --   --  9.3  --   --   --  8.7*  --  7.9* 8.0* 7.7*  --  7.6*  PHOS  --   --   --   --  3.9   --  4.0 4.9* 5.6*  --  3.1 2.6  --  2.0*   < > = values in this interval not displayed.   CBC Recent Labs  Lab 10/03/20 0420 10/04/20 0440 10/04/20 1619 10/05/20 0311  WBC 15.9* 14.3* 10.6* 13.3*  NEUTROABS  --   --  9.6*  --   HGB 7.8* 7.1* 7.1* 7.0*  HCT 25.5* 22.9* 24.1* 23.9*  MCV 97.3 95.4 97.2 96.8  PLT 138* 110* 103* 78*    Medications:    . aspirin  81 mg Per Tube Daily  . chlorhexidine gluconate (MEDLINE KIT)  15 mL Mouth Rinse BID  . Chlorhexidine Gluconate Cloth  6 each Topical Daily  . famotidine  20 mg Per Tube Daily  . heparin  5,000 Units Subcutaneous Q8H  . hydrocortisone sod succinate (SOLU-CORTEF) inj  50 mg Intravenous Q6H  . insulin aspart  0-15 Units Subcutaneous Q4H  . [START ON 10/06/2020] insulin glargine  15 Units Subcutaneous Daily  . insulin glargine  5 Units Subcutaneous Once  . mouth rinse  15 mL Mouth Rinse 10 times per day  . mycophenolate  500 mg Per Tube BID  . sodium bicarbonate  650 mg Per Tube TID  . sodium chloride flush  10 mL Intracatheter Q8H  . sodium chloride flush  10-40 mL Intracatheter Q12H      Gean Quint, MD [facility] 10/05/2020, 11:10 AM

## 2020-10-05 NOTE — Progress Notes (Signed)
Could not connect family with elink video chat.

## 2020-10-05 NOTE — Progress Notes (Signed)
Updated daughters on patient status via phone. They were very appreciative of the update.

## 2020-10-05 NOTE — Progress Notes (Signed)
Assisted tele visit to patient with family member.  Derinda Bartus Ann, RN  

## 2020-10-06 ENCOUNTER — Inpatient Hospital Stay (HOSPITAL_COMMUNITY): Payer: Medicare Other

## 2020-10-06 DIAGNOSIS — I469 Cardiac arrest, cause unspecified: Secondary | ICD-10-CM | POA: Diagnosis not present

## 2020-10-06 DIAGNOSIS — J9601 Acute respiratory failure with hypoxia: Secondary | ICD-10-CM | POA: Diagnosis not present

## 2020-10-06 DIAGNOSIS — S270XXD Traumatic pneumothorax, subsequent encounter: Secondary | ICD-10-CM | POA: Diagnosis not present

## 2020-10-06 DIAGNOSIS — I639 Cerebral infarction, unspecified: Secondary | ICD-10-CM | POA: Diagnosis not present

## 2020-10-06 LAB — RENAL FUNCTION PANEL
Albumin: 1.8 g/dL — ABNORMAL LOW (ref 3.5–5.0)
Albumin: 1.8 g/dL — ABNORMAL LOW (ref 3.5–5.0)
Anion gap: 11 (ref 5–15)
Anion gap: 13 (ref 5–15)
BUN: 33 mg/dL — ABNORMAL HIGH (ref 8–23)
BUN: 36 mg/dL — ABNORMAL HIGH (ref 8–23)
CO2: 25 mmol/L (ref 22–32)
CO2: 23 mmol/L (ref 22–32)
Calcium: 7.6 mg/dL — ABNORMAL LOW (ref 8.9–10.3)
Calcium: 7.3 mg/dL — ABNORMAL LOW (ref 8.9–10.3)
Chloride: 102 mmol/L (ref 98–111)
Chloride: 102 mmol/L (ref 98–111)
Creatinine, Ser: 1.54 mg/dL — ABNORMAL HIGH (ref 0.61–1.24)
Creatinine, Ser: 1.36 mg/dL — ABNORMAL HIGH (ref 0.61–1.24)
GFR, Estimated: 47 mL/min — ABNORMAL LOW (ref >60)
GFR, Estimated: 55 mL/min — ABNORMAL LOW (ref >60)
Glucose, Bld: 220 mg/dL — ABNORMAL HIGH (ref 70–99)
Glucose, Bld: 317 mg/dL — ABNORMAL HIGH (ref 70–99)
Phosphorus: <1 mg/dL — CL (ref 2.5–4.6)
Phosphorus: 1.5 mg/dL — ABNORMAL LOW (ref 2.5–4.6)
Potassium: 4.1 mmol/L (ref 3.5–5.1)
Potassium: 3.8 mmol/L (ref 3.5–5.1)
Sodium: 138 mmol/L (ref 135–145)
Sodium: 138 mmol/L (ref 135–145)

## 2020-10-06 LAB — LIPID PANEL
Cholesterol: 64 mg/dL (ref 0–200)
HDL: 16 mg/dL — ABNORMAL LOW (ref >40)
LDL Cholesterol: 32 mg/dL (ref 0–99)
Total CHOL/HDL Ratio: 4 RATIO
Triglycerides: 82 mg/dL (ref <150)
VLDL: 16 mg/dL (ref 0–40)

## 2020-10-06 LAB — CBC
HCT: 24.4 % — ABNORMAL LOW (ref 39.0–52.0)
Hemoglobin: 7.1 g/dL — ABNORMAL LOW (ref 13.0–17.0)
MCH: 28 pg (ref 26.0–34.0)
MCHC: 29.1 g/dL — ABNORMAL LOW (ref 30.0–36.0)
MCV: 96.1 fL (ref 80.0–100.0)
Platelets: 74 10*3/uL — ABNORMAL LOW (ref 150–400)
RBC: 2.54 MIL/uL — ABNORMAL LOW (ref 4.22–5.81)
RDW: 16.2 % — ABNORMAL HIGH (ref 11.5–15.5)
WBC: 14.3 10*3/uL — ABNORMAL HIGH (ref 4.0–10.5)
nRBC: 2.2 % — ABNORMAL HIGH (ref 0.0–0.2)

## 2020-10-06 LAB — GLUCOSE, CAPILLARY
Glucose-Capillary: 198 mg/dL — ABNORMAL HIGH (ref 70–99)
Glucose-Capillary: 200 mg/dL — ABNORMAL HIGH (ref 70–99)
Glucose-Capillary: 227 mg/dL — ABNORMAL HIGH (ref 70–99)
Glucose-Capillary: 260 mg/dL — ABNORMAL HIGH (ref 70–99)
Glucose-Capillary: 270 mg/dL — ABNORMAL HIGH (ref 70–99)
Glucose-Capillary: 295 mg/dL — ABNORMAL HIGH (ref 70–99)

## 2020-10-06 LAB — HEMOGLOBIN A1C
Hgb A1c MFr Bld: 6.4 % — ABNORMAL HIGH (ref 4.8–5.6)
Mean Plasma Glucose: 136.98 mg/dL

## 2020-10-06 LAB — PHOSPHORUS: Phosphorus: 1.6 mg/dL — ABNORMAL LOW (ref 2.5–4.6)

## 2020-10-06 LAB — POTASSIUM: Potassium: 4.3 mmol/L (ref 3.5–5.1)

## 2020-10-06 MED ORDER — ACETAMINOPHEN 325 MG PO TABS: 650.0000 mg | ORAL_TABLET | Freq: Four times a day (QID) | ORAL | Status: DC | PRN

## 2020-10-06 MED ORDER — POTASSIUM PHOSPHATES 15 MMOLE/5ML IV SOLN
20.0000 mmol | Freq: Once | INTRAVENOUS | Status: AC
  Administered 2020-10-06: 20 mmol via INTRAVENOUS
  Filled 2020-10-06: qty 6.67

## 2020-10-06 MED ORDER — PREDNISONE 5 MG PO TABS
5.0000 mg | ORAL_TABLET | Freq: Every morning | ORAL | Status: DC
  Administered 2020-10-07 – 2020-10-13 (×7): 5 mg
  Filled 2020-10-06 (×7): qty 1

## 2020-10-06 MED ORDER — PROSOURCE TF PO LIQD
45.0000 mL | Freq: Four times a day (QID) | ORAL | Status: DC
  Administered 2020-10-06 – 2020-10-14 (×33): 45 mL
  Filled 2020-10-06 (×30): qty 45

## 2020-10-06 NOTE — Progress Notes (Signed)
Assisted tele visit to patient with family member.  Margaret Pyle, RN

## 2020-10-06 NOTE — Progress Notes (Signed)
Notified provider that upon reassessment of patient, soft lump palpated on upper right back and edema on right side of neck under dialysis access site observed. Provider ordered xray of neck and chest be obtained.

## 2020-10-06 NOTE — Progress Notes (Signed)
PT Cancellation Note  Patient Details Name: Omega Slager MRN: [id number] DOB: [birth year]   Cancelled Treatment:    Reason Eval/Treat Not Completed: Patient not medically ready (pt on vent, CRRT, non responsive and not medically appropriate)   Mackey Varricchio B Jonnae Fonseca 10/06/2020, 9:11 AM  Bayard Males, PT Acute Rehabilitation Services Pager: [phone] Office: [phone]

## 2020-10-06 NOTE — Progress Notes (Signed)
eLink Physician-Brief Progress Note Patient Name: Faust Thorington DOB: 26-Jul-1946 MRN: [id number]   Date of Service  10/06/2020  HPI/Events of Note  Request for flexiseal for watery stools Ongoing CRRT  eICU Interventions  Flexiseal ordered Ultrafiltration to be adjusted as needed to keep euvolemic     Intervention Category Intermediate Interventions: Other:  Judd Lien 10/06/2020, 10:02 PM

## 2020-10-06 NOTE — Plan of Care (Signed)
  Problem: Health Behavior/Discharge Planning: Goal: Ability to manage health-related needs will improve Outcome: Progressing   Problem: Clinical Measurements: Goal: Ability to maintain clinical measurements within normal limits will improve Outcome: Progressing Goal: Will remain free from infection Outcome: Progressing Goal: Diagnostic test results will improve Outcome: Progressing Goal: Respiratory complications will improve Outcome: Progressing Goal: Cardiovascular complication will be avoided Outcome: Progressing   Problem: Activity: Goal: Risk for activity intolerance will decrease Outcome: Progressing   Problem: Nutrition: Goal: Adequate nutrition will be maintained Outcome: Progressing   Problem: Coping: Goal: Level of anxiety will decrease Outcome: Progressing   Problem: Elimination: Goal: Will not experience complications related to bowel motility Outcome: Progressing Goal: Will not experience complications related to urinary retention Outcome: Progressing   Problem: Pain Managment: Goal: General experience of comfort will improve Outcome: Progressing   Problem: Safety: Goal: Ability to remain free from injury will improve Outcome: Progressing   Problem: Skin Integrity: Goal: Risk for impaired skin integrity will decrease Outcome: Progressing   Problem: Coping: Goal: Will identify appropriate support needs Outcome: Progressing   Problem: Health Behavior/Discharge Planning: Goal: Ability to manage health-related needs will improve Outcome: Progressing   Problem: Ischemic Stroke/TIA Tissue Perfusion: Goal: Complications of ischemic stroke/TIA will be minimized Outcome: Progressing   Problem: Activity: Goal: Ability to tolerate increased activity will improve Outcome: Progressing   Problem: Respiratory: Goal: Ability to maintain a clear airway and adequate ventilation will improve Outcome: Progressing   Problem: Role Relationship: Goal: Method  of communication will improve Outcome: Progressing

## 2020-10-06 NOTE — Progress Notes (Signed)
NAME:  [name], MRN:  [id number], DOB:  [birth year], LOS: 5 ADMISSION DATE:  09/21/2020, CONSULTATION DATE:  1/12 REFERRING MD:  Dr. Rogene Houston, CHIEF COMPLAINT:  Cardiac arrest   Brief History:  75 year old male with renal transplant history admitted 1/12 after PEA arrest 12 minutes.  History of Present Illness:  75 year old male with PMH as below, which is significant for CKD (previously on HD, but is s/p renal transplant in 2005 at [facility]), AF not on [facility], who presented to [name] ED after cardiac arrest athome 1/12. Apparently 1/11 in the evening hours he developed chest pain and dyspnea, but proceeded to go to bed. Then 1/12 AM his wife awoke around 0930 and found him to be unresponsive and foaming at the mouth. He was still breathing. As she called EMS he fell to the floor. Upon EMS arrival the patient was pulseless and ACLS was initiated. CPR was performed for approximately 12 minutes with two doses of epinephrine. He suffered injury to his L periorbit at some point during all this, raising concern for head injury and he was treated as a level one trauma in ED. Upon arrival he was unresponsive and was immediately intubated. He was emergently taken for pan-CT scan. No operable injury was identified. PCCM asked to admit.   Past Medical History:   Paroxysmal AF (no AC)  ESRD S/p Renal transplant x2  HFrEF  HTN  HLD  T2DM  PAD  Significant Hospital Events:  1/12 admit for PEA arrest 1/13 Pneumothorax development with pigtail catheter placed. Patient had increasing pressor requirements.   Consults:  Neurosurgery (S/o) Trauma surgery (S/o) Nephrology Neurology   Procedures:  CVL R subclavian 1/12 > ETT 1/12 > Pigtail catheter 1/14 >  R IJ HD cath 1/14 >>   Significant Diagnostic Tests:  1/12 CTA head > no large vessel occlusion. Mod-severe stenosis of the dominant R vert.  1/12 CT Maxillofacial > Subtle fracture of the posterior wall of the right maxillary sinus is  new since September and likely acute. Trace fluid within the sinus. Hematoma/contusion involving the right parotid gland and regional subcutaneous tissues. Left forehead and periorbital hematoma without underlying fracture. 1/12 CT chest/abd/pelv > Numerous acute rib fractures: Right ribs 1 through 7 (with rib 2 through 4 flail segment) and left ribs 2 through 7. Mild intercostal hematoma, but no pneumothorax, hemothorax or pulmonary contusion. Low-density layering pleural effusions appear mildly increased from last month. Chronic atelectasis. MRI Brain 1/14 >> scattered small acute to early subacute cerebral and cerebellar infarcts, few scattered chronic cerebral microhemorrhages bilaterally, small amount of petechial blood associated with small left occipital infarct, possible more global hypoxic ischemic injury given subtle diffusion-weighted signal hyperintensity involving cortex posteriorly  Micro Data:  1/12 Urine Cultures > Negative 1/13 Blood Cultures >   Antimicrobials:  Ceftriaxone 1/13 >> 1/14; 1/16 >>  Cefepime 1/14 >> 1/16  Interim History / Subjective:   Norepinephrine weaned to off Remains on low-dose fentanyl, currently 10 Glucose 220 Platelets 74, hemoglobin 7.1 Phosphorus replaced overnight   Objective   Blood pressure (!) 114/57, pulse 72, temperature 98.5 F (36.9 C), temperature source Rectal, resp. rate (!) 24, height 5\' 11"  (1.803 m), weight 84.6 kg, SpO2 100 %.    Vent Mode: PRVC FiO2 (%):  [30 %-40 %] 30 % Set Rate:  [24 bmp] 24 bmp Vt Set:  [600 mL] 600 mL PEEP:  [5 cmH20] 5 cmH20 Plateau Pressure:  [19 cmH20-26 cmH20] 24 cmH20  Intake/Output Summary (Last 24 hours) at 10/06/2020 1101 Last data filed at 10/06/2020 1000 Gross per 24 hour  Intake 2349.91 ml  Output 3851 ml  Net -1501.09 ml   Filed Weights   10/04/20 0452 10/05/20 0500 10/06/20 0327  Weight: 89.5 kg 86.6 kg 84.6 kg   Gen: Critically ill-appearing man, ventilated HEENT: ET tube in good  position, laceration on forehead over left orbit and left eye edema, hemorrhage RN changed.  Pupils do react Neck: Right IJ HD catheter, right subclavian catheter Lungs distant, coarse bilaterally, no wheezing CV: Regular, no murmur Abdomen: Nondistended, positive bowel sounds Extremities: 1+ lower extremity edema Neuro: Eyes open to voice, resist noxious stimuli, question whether he localizes, pupils react, corneal reflex present did not follow any commands today.  Does move his right and left upper extremities with pain Skin: Abrasions on bilateral upper extremities, dressings in place.  Resolved Hospital Problem list     Assessment & Plan:   # PEA cardiac arrest: Witnessed, no bystander CPR. 12 min ACLS. Initially suspected to be cardiogenic in the setting of pulmonary edema. TTE shows recovered EF with no new wall motion abnormalities to explain.  Questionable purposeful movement at presentation, TTM not pursued.   # Shock: Improved. Mixed picture. Concerning for hypovolemia, also possible septic shock. No clear source at this time, cultures negative -Following off pressors -Plan to convert stress dose steroids to his usual prednisone 5 mg daily on 1/17 -Changed back to ceftriaxone on 1/16 given concern for neurologic effects of cefepime.  Plan for 7 days antibiotics total (day 5 on 1/17)  # Acute hypoxic respiratory failure: Pulmonary edema with possible aspiration event during arrest. # Right Pneumothorax: Secondary to numerous rib fractures versus subclavian central line complication. Pigtail catheter in place. Repeat CXR this AM shows improvement in pneumothorax.  -Continue PRVC, okay to initiate PSV trials but is not extubation candidate due to his encephalopathy -Continue bronchial hygiene -Right pigtail catheter in place and to suction -20 cmH2O -Follow chest x-ray  # Acute Renal Failure:  # CKD:  Hx renal transplant x 2 (1983, 2005 and now on Cellcept, Prograf,  Prednisone). # Hypomagnesemia  # Hyperkalemia  -Appreciate nephrology management -CVVHD started on 1/14, continuing as we follow neurological progress/prognosis.  Currently oliguric -Continue mycophenolate, tacrolimus stopped on 1/16 as it can have neurological effects, can sometimes be associated with MRI changes that resemble PRES.  Discussed with Neurology  # Acute Encephalopathy, concerning for anoxic injury:  #Small acute subcortical bilateral scattered infarcts, possibly embolic -Some petechial blood present on MRI brain in association with infarcts.  Continue to hold anticoagulation at this time -Appreciate neurology evaluation and follow-up for prognostication.  Currently recommending at least 7 days of evaluation to assess for neurological improvement.  If there is improvement then likely will follow longer. -Cefepime changed, tacrolimus discontinued on 1/16 per recommendations -Continue to address any metabolic contributors to encephalopathy  -Continue to minimize sedation as able  # A. Fib, Paroxysmal: Previous hx, not on AC. Stable at this point.  -Continue to hold off on anticoagulation even with possible embolic CVAs given his petechial blood associated with one of his occipital cerebral infarcts  # HFrEF with recovered EF: LVEF 40-45% with grade 3 DD. Repeat TTE this admission shows improvement in EF however, evidence of severe LVH with cavity obliteration during systole.  -Volume status and volume removal via CVVHD as blood pressure will tolerate  # T2DM -Sliding scale insulin as per protocol  # R, L  rib fx: flail segments by CT on the right # R maxillary sinus wall fracture -Pain management  Best practice (evaluated daily)  Diet: Tube Feeds  Pain/Anxiety/Delirium protocol (if indicated): Fentanyl VAP protocol (if indicated): Per protocol DVT prophylaxis: SQH GI prophylaxis: Pepcid Glucose control: SSI Mobility: BR Disposition: FULL  Family: updated daughters and  spouse by phone on 1/17.   Goals of Care:  Last date of multidisciplinary goals of care discussion: 1/12 Family and staff present:  Summary of discussion: Follow up goals of care discussion due: 1/19 Code Status: FULL code  Labs   CBC: Recent Labs  Lab 10/03/20 0420 10/04/20 0440 10/04/20 1619 10/05/20 0311 10/06/20 0312  WBC 15.9* 14.3* 10.6* 13.3* 14.3*  NEUTROABS  --   --  9.6*  --   --   HGB 7.8* 7.1* 7.1* 7.0* 7.1*  HCT 25.5* 22.9* 24.1* 23.9* 24.4*  MCV 97.3 95.4 97.2 96.8 96.1  PLT 138* 110* 103* 78* 74*    Basic Metabolic Panel: Recent Labs  Lab 10/02/20 1644 10/03/20 0420 10/03/20 1745 10/03/20 2309 10/04/20 0440 10/04/20 1619 10/05/20 0058 10/05/20 0311 10/05/20 1629 10/06/20 0312 10/06/20 0728  NA  --  140  --  141 139 139  --  138 138 138  --   K  --  5.3*  --  5.0 4.4 3.8 3.7 3.6 3.8 4.1 4.3  CL  --  109  --  105 106 104  --  104 103 102  --   CO2  --  16*  --  18* 20* 21*  --  22 24 25   --   GLUCOSE  --  276*  --  243* 205* 122*  --  225* 346* 220*  --   BUN  --  56*  --  56* 47* 39*  --  34* 35* 33*  --   CREATININE  --  4.31*  --  4.23* 3.41* 2.71*  --  2.24* 1.80* 1.54*  --   CALCIUM  --  8.7*  --  7.9* 8.0* 7.7*  --  7.6* 7.5* 7.6*  --   MG 1.8 1.8 1.9 1.9  --   --  2.3  --   --   --   --   PHOS 4.0 4.9* 5.6*  --  3.1 2.6  --  2.0* 1.5* <1.0* 1.6*   GFR: Estimated Creatinine Clearance: 45.5 mL/min (A) (by C-G formula based on SCr of 1.54 mg/dL (H)). Recent Labs  Lab 10/05/2020 1205 10/11/2020 1208 10/04/20 0440 10/04/20 1619 10/05/20 0311 10/06/20 0312  WBC  --    < > 14.3* 10.6* 13.3* 14.3*  LATICACIDVEN 2.1*  --   --   --   --   --    < > = values in this interval not displayed.    Liver Function Tests: Recent Labs  Lab 10/13/2020 1208 10/02/20 0930 10/03/20 2309 10/04/20 0440 10/04/20 1619 10/05/20 0311 10/05/20 1629 10/06/20 0312  AST 50* 48* 56*  --   --   --   --   --   ALT 22 22 30   --   --   --   --   --   ALKPHOS 93  72 94  --   --   --   --   --   BILITOT 0.6 1.2 0.8  --   --   --   --   --   PROT 5.3* 4.5* 4.7*  --   --   --   --   --  ALBUMIN 2.9* 2.4* 2.2* 2.1* 2.0* 1.8* 1.9* 1.8*   No results for input(s): LIPASE, AMYLASE in the last 168 hours. No results for input(s): AMMONIA in the last 168 hours.  ABG    Component Value Date/Time   PHART 7.298 (L) 10/10/2020 1218   PCO2ART 49.4 (H) 09/21/2020 1218   PO2ART 85 10/13/2020 1218   HCO3 18.1 (L) 10/02/2020 1614   TCO2 19 (L) 10/02/2020 1614   ACIDBASEDEF 7.0 (H) 10/02/2020 1614   O2SAT 54.0 10/02/2020 1614     Coagulation Profile: Recent Labs  Lab 10/05/2020 1208  INR 1.2    Cardiac Enzymes: No results for input(s): CKTOTAL, CKMB, CKMBINDEX, TROPONINI in the last 168 hours.  HbA1C: Hgb A1c MFr Bld  Date/Time Value Ref Range Status  10/06/2020 03:12 AM 6.4 (H) 4.8 - 5.6 % Final    Comment:    (NOTE) Pre diabetes:          5.7%-6.4%  Diabetes:              >6.4%  Glycemic control for   <7.0% adults with diabetes   10/02/2020 06:50 PM 6.1 (H) 4.8 - 5.6 % Final    Comment:    (NOTE) Pre diabetes:          5.7%-6.4%  Diabetes:              >6.4%  Glycemic control for   <7.0% adults with diabetes     CBG: Recent Labs  Lab 10/05/20 1610 10/05/20 1930 10/06/20 0015 10/06/20 0316 10/06/20 0717  GLUCAP 322* 322* 198* 200* 227*   Independent critical care time 33 minutes   Baltazar Apo, MD, PhD 10/06/2020, 11:01 AM [facility] [phone] or if no answer [phone]

## 2020-10-06 NOTE — Progress Notes (Signed)
OT Cancellation Note  Patient Details Name: [name] MRN: [id number] DOB: 1946/08/27   Cancelled Treatment:    Reason Eval/Treat Not Completed: Patient not medically ready. pt on vent, CRRT, non responsive and not medically appropriate  Jaci Carrel 10/06/2020, 10:16 AM   Jesse Sans OTR/L Acute Rehabilitation Services Pager: [phone] Office: [phone]

## 2020-10-06 NOTE — Progress Notes (Signed)
Assisted tele visit to patient with family members.  Hillard Danker, RN

## 2020-10-06 NOTE — Progress Notes (Signed)
[facility] Progress Note   75 y.o. male with HTN HLD, type II DM, CHF, A. Fib, PAD and gout ESRD status post kidney transplant in 1983, 2005- [facility]-  Standard criteria DD, CKD 4 with baseline creatinine level around 2..0-2.2, s/p hosp in September 2021 (AKI with peak creatinine level of 2.73) and December 2021- in December left foot infection, osteomyelitis (AKI on CKD that req CRRT)  He follows with Dr. Marval Regal at [facility] before hosp in December was around 2.  In late December 2021 crt 2.2 to 2.4 after requiring CRRT from 12/15- 12/17 in hosp.  Was discharged on 12/23 on mycophenolate 500 BID and prograf 1 mg BID.   He now presents with out of hospital cardiac arrest- wife found him unresponsive in bed on 1/12, pulseless- EMS performed 12 minutes of ACLS.  Since hosp has been unresponsive- febrile and has become more hemodynamically unstable on norepi.  Neurology has seen- EEG profound diffuse encephalopathy.    Assessment:   1. PEA cardiac arrest, out of hospital. CPR 12 min. Etiology unclear.  2. Shock w/ pressor requirements 3. AKI, worsening, anuric. CRRT start date 10/03/20, line placement via CCM 1/14 4. CKD4, h/o renal transplant x 2 ('83 & '05). Baseline Cr ~2-2.2. goal tac 4-6 5. Anion gap metabolic acidosis 6. Hyperkalemia, mild 7. Encephalopathy, hypoxic and toxic metabolic 8. Acute ischemic strokes (embolic from afib and cpr) 9. Uncontrolled DM2 w/ hyperglycemia, mgmt per primary service  Plan: -essentially anuric now unfortunately -continue with CRRT for now. UF limited by BP/shock. Target net neg 50-100cc/hr as tolerated - 4k/2.5cal bag 500/300/1500 pre/post/dialysate with net UF 56ml/hr. Can increase to 100 ml/hr as tolerated. Not on pressors. -too soon for prognostication, appreciate neuro's assistance. Concern for PRES, can hold tac. CKD4 to begin with now with severe AKI, covered with stress dose steroids, reasonable to trial him off  tacrolimus. At this point, I am suspecting he won't have much renal recovery. Stopped his tacrolimus on 1/16. - tac trough 1/13 pending, now on cellcept and stress dose steroids - Will stop the HCO3 as well while on CRRT. -Avoid nephrotoxic medications including NSAIDs and iodinated intravenous contrast exposure unless the latter is absolutely necessary.  Subjective:   No events overnight; tolerating CRRT   Objective:   BP (!) 108/54   Pulse 71   Temp 98.6 F (37 C) (Rectal)   Resp (!) 24   Ht $R'5\' 11"'WG$  (1.803 m)   Wt 84.6 kg   SpO2 100%   BMI 26.01 kg/m   Intake/Output Summary (Last 24 hours) at 10/06/2020 2482 Last data filed at 10/06/2020 5003 Gross per 24 hour  Intake 2445.99 ml  Output 4206 ml  Net -1760.01 ml   Weight change: -2 kg  Physical Exam: Gen:ill appearing CVS:reg rate Resp:intubated Abd:nd Ext: anasarca Neuro: sedated, unresponsive Access: rij temp line c/d/i  Imaging: DG Chest Port 1 View  Result Date: 10/05/2020 CLINICAL DATA:  75 year old male status post fall, PEA arrest. History of renal failure, renal transplants. Right pneumothorax treated with chest tube. EXAM: PORTABLE CHEST 1 VIEW COMPARISON:  Portable chest 10/04/2020 and earlier. FINDINGS: Portable AP upright view at 0506 hours. Stable lines and tubes. No pneumothorax is evident today. Stable cardiomegaly and mediastinal contours. Continued dense retrocardiac opacity. No pulmonary edema. Paucity of bowel gas. IMPRESSION: 1. Stable lines and tubes. No pneumothorax is evident today. 2. Cardiomegaly with continued dense lung base opacity suggesting lower lobe collapse or consolidation. Electronically Signed  By: Genevie Ann M.D.   On: 10/05/2020 07:36    Labs: BMET Recent Labs  Lab 10/03/20 0420 10/03/20 1745 10/03/20 2309 10/04/20 0440 10/04/20 1619 10/05/20 0058 10/05/20 0311 10/05/20 1629 10/06/20 0312 10/06/20 0728  NA 140  --  141 139 139  --  138 138 138  --   K 5.3*  --  5.0 4.4 3.8  3.7 3.6 3.8 4.1 4.3  CL 109  --  105 106 104  --  104 103 102  --   CO2 16*  --  18* 20* 21*  --  22 24 25   --   GLUCOSE 276*  --  243* 205* 122*  --  225* 346* 220*  --   BUN 56*  --  56* 47* 39*  --  34* 35* 33*  --   CREATININE 4.31*  --  4.23* 3.41* 2.71*  --  2.24* 1.80* 1.54*  --   CALCIUM 8.7*  --  7.9* 8.0* 7.7*  --  7.6* 7.5* 7.6*  --   PHOS 4.9* 5.6*  --  3.1 2.6  --  2.0* 1.5* <1.0* 1.6*   CBC Recent Labs  Lab 10/04/20 0440 10/04/20 1619 10/05/20 0311 10/06/20 0312  WBC 14.3* 10.6* 13.3* 14.3*  NEUTROABS  --  9.6*  --   --   HGB 7.1* 7.1* 7.0* 7.1*  HCT 22.9* 24.1* 23.9* 24.4*  MCV 95.4 97.2 96.8 96.1  PLT 110* 103* 78* 74*    Medications:    . aspirin  81 mg Per Tube Daily  . chlorhexidine gluconate (MEDLINE KIT)  15 mL Mouth Rinse BID  . Chlorhexidine Gluconate Cloth  6 each Topical Daily  . famotidine  20 mg Per Tube Daily  . heparin  5,000 Units Subcutaneous Q8H  . hydrocortisone sod succinate (SOLU-CORTEF) inj  50 mg Intravenous Q6H  . insulin aspart  0-15 Units Subcutaneous Q4H  . insulin glargine  15 Units Subcutaneous Daily  . mouth rinse  15 mL Mouth Rinse 10 times per day  . mycophenolate  500 mg Per Tube BID  . sodium bicarbonate  650 mg Per Tube TID  . sodium chloride flush  10 mL Intracatheter Q8H  . sodium chloride flush  10-40 mL Intracatheter Q12H      Otelia Santee, MD 10/06/2020, 9:38 AM

## 2020-10-06 NOTE — Progress Notes (Signed)
Notified provider of neck and chest x-ray results. No new orders at this time.

## 2020-10-06 NOTE — Progress Notes (Addendum)
Initial Nutrition Assessment  DOCUMENTATION CODES:   Not applicable  INTERVENTION:   Cintinue tube feeding via OG tube: Vital AF 1.2 at 75 ml/h (1800 ml per day)  Add Prosource TF 45 ml QID  Provides 2320 kcal, 179 gm protein, 1460 ml free water daily  NUTRITION DIAGNOSIS:   Inadequate oral intake related to inability to eat as evidenced by NPO status.  GOAL:   Patient will meet greater than or equal to 90% of their needs  MONITOR:   Vent status,TF tolerance,Skin,Labs  REASON FOR ASSESSMENT:   Ventilator,Consult Enteral/tube feeding initiation and management  ASSESSMENT:   75 yo male admitted after fall with PEA arrest S/P 12 minutes ACLS. PMH includes 2 renal transplants, CKD stage 4, CHF, HTN.   CRRT initiated 1/14, ongoing. Increased protein needs for CRRT.   Chest tube in place for R pneumothorax.  Received K Phos 20 meq IV x 1 this AM for repletion of phosphorus. Currently receiving Vital AF 1.2 via OGT at 75 ml/h (1800 ml/day) to provide 2160 kcals, 135 gm protein, 1460 ml free water daily.   Patient remains intubated on ventilator support MV: 14.2 L/min Temp (24hrs), Avg:98.4 F (36.9 C), Min:97.2 F (36.2 C), Max:98.9 F (37.2 C)   Labs reviewed. BUN 33, creatinine 1.54, A1C 6.4 Phos <1 --> 1.6, K 4.3 WNL CBG: 198-200-227  Medications reviewed and include solucortef, novolog, lantus, K Phos.  Weight 84.6 kg today, down from 86.2 kg on admission I/O +1.5 L since admission Chest tube 40 ml output x 24 h Stool 300 ml output + 2 unmeasured occurences x 24 h CRRT 3,518 ml output x 24 h   Diet Order:   Diet Order            Diet NPO time specified  Diet effective now                 EDUCATION NEEDS:   Not appropriate for education at this time  Skin:  Skin Assessment: Skin Integrity Issues: Skin Integrity Issues:: Other (Comment) Other: partial thickness degloving injury to arm, abrasion/cut over L eye  Last BM:  1/16 type  7  Height:   Ht Readings from Last 1 Encounters:  09/28/2020 5\' 11"  (1.803 m)    Weight:   Wt Readings from Last 1 Encounters:  10/06/20 84.6 kg    Ideal Body Weight:  78.2 kg  BMI:  Body mass index is 26.01 kg/m.  Estimated Nutritional Needs:   Kcal:  2100-2300  Protein:  >/= 169 gm  Fluid:  2 L    Lucas Mallow, RD, LDN, CNSC Please refer to Amion for contact information.

## 2020-10-06 NOTE — Progress Notes (Signed)
SLP Cancellation Note  Patient Details Name: [name] MRN: [id number] DOB: [birth year]   Cancelled treatment:       Reason Eval/Treat Not Completed: Medical issues which prohibited therapy (Pt remains on the vent and RN indicated that weaning is not anticipated today. SLP will follow up on subsequent date.)  Shawanda Sievert I. Hardin Negus, [place], [place] Office number [phone] Pager [place] 10/06/2020, 9:38 AM

## 2020-10-06 NOTE — Progress Notes (Addendum)
Subjective: Continues to be encephalopathic  Exam: Vitals:   10/06/20 0845 10/06/20 0900  BP: 135/61 115/60  Pulse: 72 72  Resp: (!) 24 (!) 24  Temp:    SpO2: 100% 100%   Gen: In bed, intubated Eyes: Left eye is bloodshot Resp: Ventilated Abd: soft, nt  Neuro: MS: Opens eyes partially to noxious stimulation, he does not really engage the examiner, he does not follow commands despite multiple attempts. CN: Pupils reactive bilaterally, corneals intact, he resists doll's eye maneuver Motor: He moves all extremities to noxious stimulation, but does not clearly cross midline or clavicle Sensory: As above  Pertinent Labs: Creatinine 1.54 Phosphorus less than one Albumin 1.8  Impression: 75 yo M with persistent encephalopathy in the setting of cardiac arrest with suggestion of hypoxic injury on MRI in the posterior regiosn, multiple small infarcts. He did not follow commands for me today, and my suspicion is for a multifactorial process, though given the degree of his encephalopathy, I am concerned for significant hypoxic injury. I would continue to monitor for now to assess for improvement.   Recommendations: 1) neurology will continue to follow.  2) continue ASA for stroke prevention  3) aldready on low dose statin at home, no need for increase as unlikley to be atherogenic 4) continue DM managemetn 5) will need therapy if he survives.  6) repletion of phosphorus per primary team.   Roland Rack, MD [facility] [phone]  If 7pm- 7am, please page neurology on call as listed in [place].

## 2020-10-06 NOTE — Progress Notes (Signed)
CRITICAL VALUE ALERT  Critical Value:  Phos < 1.0  Date & Time Notied:  10/06/2020   0405  Provider Notified: elink  Orders Received/Actions taken: none as of yet

## 2020-10-06 NOTE — Progress Notes (Signed)
[facility] Progress Note Patient Name: [name] DOB: 1946-07-20 MRN: [id number]   Date of Service  10/06/2020  HPI/Events of Note  Low Po4 1, K > 4. Has a central line  eICU Interventions  Kphos 20 meq IV once. Follow K/po4 at 8 AM     Intervention Category Intermediate Interventions: Electrolyte abnormality - evaluation and management  Elmer Sow 10/06/2020, 4:26 AM

## 2020-10-06 NOTE — Plan of Care (Signed)
  Problem: Education: Goal: Knowledge of disease or condition will improve Outcome: Not Progressing Goal: Knowledge of secondary prevention will improve Outcome: Not Progressing Goal: Knowledge of patient specific risk factors addressed and post discharge goals established will improve Outcome: Not Progressing   Problem: Coping: Goal: Will identify appropriate support needs Outcome: Not Progressing   Problem: Health Behavior/Discharge Planning: Goal: Ability to manage health-related needs will improve Outcome: Not Progressing   Problem: Ischemic Stroke/TIA Tissue Perfusion: Goal: Complications of ischemic stroke/TIA will be minimized Outcome: Progressing   Problem: Activity: Goal: Ability to tolerate increased activity will improve Outcome: Not Progressing   Problem: Respiratory: Goal: Ability to maintain a clear airway and adequate ventilation will improve Outcome: Progressing   Problem: Role Relationship: Goal: Method of communication will improve Outcome: Not Progressing

## 2020-10-07 ENCOUNTER — Inpatient Hospital Stay (HOSPITAL_COMMUNITY): Payer: Medicare Other

## 2020-10-07 DIAGNOSIS — I639 Cerebral infarction, unspecified: Secondary | ICD-10-CM | POA: Diagnosis not present

## 2020-10-07 DIAGNOSIS — I469 Cardiac arrest, cause unspecified: Secondary | ICD-10-CM | POA: Diagnosis not present

## 2020-10-07 DIAGNOSIS — G931 Anoxic brain damage, not elsewhere classified: Secondary | ICD-10-CM | POA: Diagnosis not present

## 2020-10-07 DIAGNOSIS — J9601 Acute respiratory failure with hypoxia: Secondary | ICD-10-CM | POA: Diagnosis not present

## 2020-10-07 DIAGNOSIS — Z9689 Presence of other specified functional implants: Secondary | ICD-10-CM | POA: Diagnosis not present

## 2020-10-07 LAB — CULTURE, BLOOD (ROUTINE X 2)
Culture: NO GROWTH
Special Requests: ADEQUATE

## 2020-10-07 LAB — RENAL FUNCTION PANEL
Albumin: 1.9 g/dL — ABNORMAL LOW (ref 3.5–5.0)
Albumin: 1.8 g/dL — ABNORMAL LOW (ref 3.5–5.0)
Anion gap: 12 (ref 5–15)
Anion gap: 13 (ref 5–15)
BUN: 41 mg/dL — ABNORMAL HIGH (ref 8–23)
BUN: 46 mg/dL — ABNORMAL HIGH (ref 8–23)
CO2: 24 mmol/L (ref 22–32)
CO2: 22 mmol/L (ref 22–32)
Calcium: 7.5 mg/dL — ABNORMAL LOW (ref 8.9–10.3)
Calcium: 7 mg/dL — ABNORMAL LOW (ref 8.9–10.3)
Chloride: 100 mmol/L (ref 98–111)
Chloride: 99 mmol/L (ref 98–111)
Creatinine, Ser: 1.32 mg/dL — ABNORMAL HIGH (ref 0.61–1.24)
Creatinine, Ser: 1.5 mg/dL — ABNORMAL HIGH (ref 0.61–1.24)
GFR, Estimated: 57 mL/min — ABNORMAL LOW (ref >60)
GFR, Estimated: 49 mL/min — ABNORMAL LOW (ref >60)
Glucose, Bld: 241 mg/dL — ABNORMAL HIGH (ref 70–99)
Glucose, Bld: 239 mg/dL — ABNORMAL HIGH (ref 70–99)
Phosphorus: 1.6 mg/dL — ABNORMAL LOW (ref 2.5–4.6)
Phosphorus: 3.4 mg/dL (ref 2.5–4.6)
Potassium: 3.9 mmol/L (ref 3.5–5.1)
Potassium: 4.7 mmol/L (ref 3.5–5.1)
Sodium: 136 mmol/L (ref 135–145)
Sodium: 134 mmol/L — ABNORMAL LOW (ref 135–145)

## 2020-10-07 LAB — GLUCOSE, CAPILLARY
Glucose-Capillary: 133 mg/dL — ABNORMAL HIGH (ref 70–99)
Glucose-Capillary: 142 mg/dL — ABNORMAL HIGH (ref 70–99)
Glucose-Capillary: 174 mg/dL — ABNORMAL HIGH (ref 70–99)
Glucose-Capillary: 187 mg/dL — ABNORMAL HIGH (ref 70–99)
Glucose-Capillary: 219 mg/dL — ABNORMAL HIGH (ref 70–99)
Glucose-Capillary: 229 mg/dL — ABNORMAL HIGH (ref 70–99)
Glucose-Capillary: 266 mg/dL — ABNORMAL HIGH (ref 70–99)

## 2020-10-07 LAB — CBC
HCT: 27.3 % — ABNORMAL LOW (ref 39.0–52.0)
Hemoglobin: 8 g/dL — ABNORMAL LOW (ref 13.0–17.0)
MCH: 28.2 pg (ref 26.0–34.0)
MCHC: 29.3 g/dL — ABNORMAL LOW (ref 30.0–36.0)
MCV: 96.1 fL (ref 80.0–100.0)
Platelets: 96 10*3/uL — ABNORMAL LOW (ref 150–400)
RBC: 2.84 MIL/uL — ABNORMAL LOW (ref 4.22–5.81)
RDW: 16.6 % — ABNORMAL HIGH (ref 11.5–15.5)
WBC: 17.7 10*3/uL — ABNORMAL HIGH (ref 4.0–10.5)
nRBC: 2.4 % — ABNORMAL HIGH (ref 0.0–0.2)

## 2020-10-07 MED ORDER — POTASSIUM PHOSPHATES 15 MMOLE/5ML IV SOLN
30.0000 mmol | Freq: Once | INTRAVENOUS | Status: AC
  Administered 2020-10-07: 30 mmol via INTRAVENOUS
  Filled 2020-10-07: qty 10

## 2020-10-07 MED ORDER — ATORVASTATIN CALCIUM 10 MG PO TABS
10.0000 mg | ORAL_TABLET | Freq: Every day | ORAL | Status: DC
  Administered 2020-10-08 – 2020-10-14 (×7): 10 mg
  Filled 2020-10-07 (×7): qty 1

## 2020-10-07 MED ORDER — INSULIN ASPART 100 UNIT/ML ~~LOC~~ SOLN
4.0000 [IU] | Freq: Four times a day (QID) | SUBCUTANEOUS | Status: DC
  Administered 2020-10-07 – 2020-10-08 (×4): 4 [IU] via SUBCUTANEOUS

## 2020-10-07 MED ORDER — INSULIN ASPART 100 UNIT/ML ~~LOC~~ SOLN
0.0000 [IU] | SUBCUTANEOUS | Status: DC
  Administered 2020-10-07: 7 [IU] via SUBCUTANEOUS
  Administered 2020-10-07 (×2): 4 [IU] via SUBCUTANEOUS
  Administered 2020-10-08: 7 [IU] via SUBCUTANEOUS
  Administered 2020-10-08: 4 [IU] via SUBCUTANEOUS
  Administered 2020-10-08 – 2020-10-09 (×2): 3 [IU] via SUBCUTANEOUS
  Administered 2020-10-09: 4 [IU] via SUBCUTANEOUS
  Administered 2020-10-09: 3 [IU] via SUBCUTANEOUS
  Administered 2020-10-10: 4 [IU] via SUBCUTANEOUS
  Administered 2020-10-10 (×3): 3 [IU] via SUBCUTANEOUS
  Administered 2020-10-10 – 2020-10-11 (×2): 4 [IU] via SUBCUTANEOUS
  Administered 2020-10-11: 3 [IU] via SUBCUTANEOUS
  Administered 2020-10-11: 4 [IU] via SUBCUTANEOUS
  Administered 2020-10-12 – 2020-10-13 (×3): 3 [IU] via SUBCUTANEOUS
  Administered 2020-10-13 – 2020-10-14 (×4): 4 [IU] via SUBCUTANEOUS

## 2020-10-07 MED ORDER — INSULIN ASPART 100 UNIT/ML ~~LOC~~ SOLN
4.0000 [IU] | Freq: Four times a day (QID) | SUBCUTANEOUS | Status: DC
  Administered 2020-10-07: 4 [IU] via SUBCUTANEOUS

## 2020-10-07 MED ORDER — INSULIN GLARGINE 100 UNIT/ML ~~LOC~~ SOLN
5.0000 [IU] | Freq: Once | SUBCUTANEOUS | Status: AC
  Administered 2020-10-07: 5 [IU] via SUBCUTANEOUS
  Filled 2020-10-07: qty 0.05

## 2020-10-07 MED ORDER — ATORVASTATIN CALCIUM 10 MG PO TABS
10.0000 mg | ORAL_TABLET | Freq: Every day | ORAL | Status: DC
  Administered 2020-10-07: 10 mg via ORAL
  Filled 2020-10-07: qty 1

## 2020-10-07 MED ORDER — NOREPINEPHRINE 16 MG/250ML-% IV SOLN
0.0000 ug/min | INTRAVENOUS | Status: DC
  Administered 2020-10-07 – 2020-10-08 (×2): 15 ug/min via INTRAVENOUS
  Administered 2020-10-09: 14 ug/min via INTRAVENOUS
  Administered 2020-10-09: 29 ug/min via INTRAVENOUS
  Administered 2020-10-10: 32 ug/min via INTRAVENOUS
  Administered 2020-10-10: 16 ug/min via INTRAVENOUS
  Administered 2020-10-10: 23 ug/min via INTRAVENOUS
  Administered 2020-10-11 – 2020-10-12 (×4): 40 ug/min via INTRAVENOUS
  Administered 2020-10-12: 36 ug/min via INTRAVENOUS
  Administered 2020-10-12: 40 ug/min via INTRAVENOUS
  Administered 2020-10-12: 38 ug/min via INTRAVENOUS
  Administered 2020-10-13: 40 ug/min via INTRAVENOUS
  Administered 2020-10-13: 38 ug/min via INTRAVENOUS
  Administered 2020-10-13 – 2020-10-14 (×3): 40 ug/min via INTRAVENOUS
  Administered 2020-10-14: 33 ug/min via INTRAVENOUS
  Filled 2020-10-07 (×21): qty 250

## 2020-10-07 MED ORDER — LATANOPROST 0.005 % OP SOLN
1.0000 [drp] | Freq: Every day | OPHTHALMIC | Status: DC
  Administered 2020-10-07 – 2020-10-13 (×7): 1 [drp] via OPHTHALMIC
  Filled 2020-10-07: qty 2.5

## 2020-10-07 MED ORDER — NOREPINEPHRINE 4 MG/250ML-% IV SOLN: 0.0000 ug/min | INTRAVENOUS | Status: DC

## 2020-10-07 MED ORDER — NOREPINEPHRINE 4 MG/250ML-% IV SOLN
INTRAVENOUS | Status: AC
  Administered 2020-10-07: 2 ug/min via INTRAVENOUS
  Filled 2020-10-07: qty 250

## 2020-10-07 MED ORDER — INSULIN GLARGINE 100 UNIT/ML ~~LOC~~ SOLN
20.0000 [IU] | Freq: Every day | SUBCUTANEOUS | Status: DC
  Administered 2020-10-08: 20 [IU] via SUBCUTANEOUS
  Filled 2020-10-07 (×2): qty 0.2

## 2020-10-07 NOTE — Progress Notes (Signed)
[facility] Progress Note   19M s/p PEA arrest, persistent AMS, CKD4 s/p ddKT 2005 Tac/MMF/Pred  Assessment:   1. PEA cardiac arrest, out of hospital. CPR 12 min. Etiology unclear.  2. Shock resolved off pressors 3. AKI on CKD4, anuric. CRRT start date 10/03/20, line placement via CCM 1/14 4. CKD4, h/o renal transplant x 2 ('83 & '05). Baseline Cr ~2-2.2. Tac on hold 2/2 AMS #7 5. Anion gap metabolic acidosis; resolved 6. Hyperkalemia, mild; resolved 7. Encephalopathy, hypoxic and toxic metabolic, neuro following 8. Acute ischemic strokes (embolic from afib and cpr) 9. DM2 10. Hypophosphatemia  Plan:  Cont CRRT for another 24h, prob can move towards iHD with stable hemodynamics  If Clots overnight, no restart  Cont 4K bath   Replete P  Cont to hold Tac  Subjective:   No events overnight; tolerating CRRT All 4K, Qb 350, 500/1500/300 flows, No heparin, infreq clotting, R IJ Temp HD cath K 3.9, P 1.6   Objective:   BP (!) 112/54   Pulse 80   Temp 98.6 F (37 C) (Rectal)   Resp (!) 26   Ht 5' 11"  (1.803 m)   Wt 81.2 kg   SpO2 95%   BMI 24.97 kg/m   Intake/Output Summary (Last 24 hours) at 10/07/2020 0272 Last data filed at 10/07/2020 5366 Gross per 24 hour  Intake 2714.8 ml  Output 4897 ml  Net -2182.2 ml   Weight change: -3.4 kg  Physical Exam: Gen:ill appearing CVS:reg rate Resp:intubated Abd:nd Ext: anasarca Neuro: sedated, unresponsive Access: rij temp line c/d/i  Imaging: DG Neck Soft Tissue  Result Date: 10/06/2020 CLINICAL DATA:  Possible fluid or air around catheter EXAM: NECK SOFT TISSUES - 1+ VIEW COMPARISON:  CT 10/20/2020 FINDINGS: Limited by patient condition. Right subclavian central venous catheter tip incompletely visualized. Right jugular venous catheter with tip over the SVC. Moderate gas within the soft tissues of the right greater than left neck. Possible small amount of air within the upper mediastinum. Multiple right  upper rib fractures. IMPRESSION: 1. Limited by patient condition. 2. Moderate gas/air within the soft tissues of the right greater than left neck. Possible small amount of air within the upper mediastinum. Electronically Signed   By: Donavan Foil M.D.   On: 10/06/2020 17:52   DG Chest Port 1 View  Result Date: 10/07/2020 CLINICAL DATA:  Pneumothorax. EXAM: PORTABLE CHEST 1 VIEW COMPARISON:  10/07/2020. FINDINGS: Endotracheal tube, NG tube, right IJ sheath, right subclavian line in stable position. Right chest tube in stable position. No pneumothorax on the right noted. Again tiny left apical pneumothorax may be present. Pneumomediastinum again cannot be excluded. Stable cardiomegaly. Diffuse bilateral pulmonary infiltrates/edema again noted. Bilateral pleural effusions again noted. Bilateral chest wall subcutaneous emphysema again noted. IMPRESSION: 1. Lines and tubes in stable position. Right chest tube in stable position. No pneumothorax on the right noted. Again tiny left apical pneumothorax may be present. Pneumomediastinum again cannot be excluded. Bilateral chest wall subcutaneous emphysema again noted. 2.  Cardiomegaly. 3. Diffuse bilateral pulmonary infiltrates/edema and bilateral pleural effusions again noted without interim change. Electronically Signed   By: Marcello Moores  Register   On: 10/07/2020 05:21   DG Chest Port 1 View  Result Date: 10/07/2020 CLINICAL DATA:  Pneumothorax EXAM: PORTABLE CHEST 1 VIEW COMPARISON:  October 06, 2020 FINDINGS: The right-sided chest tube is stable in positioning. Again noted is a possible small left apical pneumothorax. No definite right-sided pneumothorax. The lines and tubes are essentially stable in  positioning. Bilateral airspace opacities are noted. There is a dense retrocardiac opacity with a probable left-sided pleural effusion. Again noted is subcutaneous emphysema involving the patient's chest wall and neck. There is questionable pneumomediastinum.  IMPRESSION: 1. Lines and tubes are essentially stable in positioning. 2. Possible small left apical pneumothorax, unchanged from prior. 3. Persistent bilateral airspace opacities and probable left-sided pleural effusion. 4. Persistent subcutaneous emphysema.  Probable pneumomediastinum. Electronically Signed   By: Constance Holster M.D.   On: 10/07/2020 00:43   DG Chest Port 1 View  Result Date: 10/06/2020 CLINICAL DATA:  Intubated concern for air around right catheter EXAM: PORTABLE CHEST 1 VIEW COMPARISON:  10/05/2020, 10/04/2020, 10/03/2020 FINDINGS: Endotracheal tube tip is about 4 cm superior to the carina. Esophageal tube tip below the diaphragm but incompletely visualized. Right IJ central venous catheter tip over the SVC. Right subclavian central venous catheter tip over the proximal right atrium. Interval finding of moderate soft tissue emphysema over the right greater than left neck and right chest wall. Right lower chest tube remains in place. New pneumomediastinum. Similar left greater than right basilar airspace disease. Enlarged cardiomediastinal silhouette. Possible small left apical pneumothorax. Multiple bilateral rib fractures. IMPRESSION: 1. Interval finding of moderate soft tissue emphysema over the neck and right chest wall with new pneumomediastinum. Possible small left apical pneumothorax. 2. Similar cardiomegaly and left greater than right basilar airspace disease. These results will be called to the ordering clinician or representative by the Radiologist Assistant, and communication documented in the PACS or [employer]. Electronically Signed   By: Donavan Foil M.D.   On: 10/06/2020 17:58    Labs: BMET Recent Labs  Lab 10/04/20 0440 10/04/20 1619 10/05/20 0058 10/05/20 0311 10/05/20 1629 10/06/20 0312 10/06/20 0728 10/06/20 1618 10/07/20 0356  NA 139 139  --  138 138 138  --  138 136  K 4.4 3.8 3.7 3.6 3.8 4.1 4.3 3.8 3.9  CL 106 104  --  104 103 102  --  102 100   CO2 20* 21*  --  22 24 25   --  23 24  GLUCOSE 205* 122*  --  225* 346* 220*  --  317* 241*  BUN 47* 39*  --  34* 35* 33*  --  36* 41*  CREATININE 3.41* 2.71*  --  2.24* 1.80* 1.54*  --  1.36* 1.32*  CALCIUM 8.0* 7.7*  --  7.6* 7.5* 7.6*  --  7.3* 7.5*  PHOS 3.1 2.6  --  2.0* 1.5* <1.0* 1.6* 1.5* 1.6*   CBC Recent Labs  Lab 10/04/20 1619 10/05/20 0311 10/06/20 0312 10/07/20 0356  WBC 10.6* 13.3* 14.3* 17.7*  NEUTROABS 9.6*  --   --   --   HGB 7.1* 7.0* 7.1* 8.0*  HCT 24.1* 23.9* 24.4* 27.3*  MCV 97.2 96.8 96.1 96.1  PLT 103* 78* 74* 96*    Medications:    . aspirin  81 mg Per Tube Daily  . atorvastatin  10 mg Oral Daily  . chlorhexidine gluconate (MEDLINE KIT)  15 mL Mouth Rinse BID  . Chlorhexidine Gluconate Cloth  6 each Topical Daily  . famotidine  20 mg Per Tube Daily  . feeding supplement (PROSource TF)  45 mL Per Tube QID  . heparin  5,000 Units Subcutaneous Q8H  . insulin aspart  0-15 Units Subcutaneous Q4H  . insulin glargine  15 Units Subcutaneous Daily  . latanoprost  1 drop Both Eyes QHS  . mouth rinse  15 mL Mouth  Rinse 10 times per day  . mycophenolate  500 mg Per Tube BID  . predniSONE  5 mg Per Tube q AM  . sodium chloride flush  10 mL Intracatheter Q8H  . sodium chloride flush  10-40 mL Intracatheter Q12H   Rexene Agent, MD  10/07/2020, 9:25 AM

## 2020-10-07 NOTE — Progress Notes (Signed)
Assisted tele visit to patient with daughter and family member.  Margaret Pyle, RN

## 2020-10-07 NOTE — Progress Notes (Signed)
OT Cancellation Note  Patient Details Name: [name] MRN: [id number] DOB: March 05, 1946   Cancelled Treatment:    Reason Eval/Treat Not Completed: Patient not medically ready (Not medically ready for therapy and will sign off. Please re-consult as needed. Thank you.)  [place], OTR/L Acute Rehab Pager: [phone] Office: [phone] 10/07/2020, 8:00 AM

## 2020-10-07 NOTE — Progress Notes (Signed)
[place] Progress Note Patient Name: [name] DOB: Jan 04, 1946 MRN: [id number]   Date of Service  10/07/2020  HPI/Events of Note  Notified of increased work of breathing. Peak pressure 32 on vent unchanged Noted of pneumothorax on previous CXR  eICU Interventions  CXR and ABG ordered     Intervention Category Major Interventions: Respiratory failure - evaluation and management  [name] 10/07/2020, 1:25 AM

## 2020-10-07 NOTE — Progress Notes (Signed)
NAME:  [name], MRN:  [id number], DOB:  1946/03/30, LOS: 6 ADMISSION DATE:  09/27/2020, CONSULTATION DATE:  1/12 REFERRING MD:  Dr. Rogene Houston, CHIEF COMPLAINT:  Cardiac arrest   Brief History:  75 year old male with renal transplant history admitted 1/12 after PEA arrest 12 minutes.  History of Present Illness:  75 year old male with PMH as below, which is significant for CKD (previously on HD, but is s/p renal transplant in 2005 at [facility]), AF not on [facility], who presented to [name] ED after cardiac arrest athome 1/12. Apparently 1/11 in the evening hours he developed chest pain and dyspnea, but proceeded to go to bed. Then 1/12 AM his wife awoke around 0930 and found him to be unresponsive and foaming at the mouth. He was still breathing. As she called EMS he fell to the floor. Upon EMS arrival the patient was pulseless and ACLS was initiated. CPR was performed for approximately 12 minutes with two doses of epinephrine. He suffered injury to his L periorbit at some point during all this, raising concern for head injury and he was treated as a level one trauma in ED. Upon arrival he was unresponsive and was immediately intubated. He was emergently taken for pan-CT scan. No operable injury was identified. PCCM asked to admit.   Past Medical History:   Paroxysmal AF (no AC)  ESRD S/p Renal transplant x2  HFrEF  HTN  HLD  T2DM  PAD  Significant Hospital Events:  1/12 admit for PEA arrest 1/13 Pneumothorax development with pigtail catheter placed. Patient had increasing pressor requirements.  1/14 start CRRT 1/18 change chest tube to water seal  Consults:  Neurosurgery (S/o) Trauma surgery (S/o) Nephrology Neurology   Procedures:  CVL R subclavian 1/12 > ETT 1/12 > Pigtail catheter 1/14 >  R IJ HD cath 1/14 >>   Significant Diagnostic Tests:  1/12 CTA head > no large vessel occlusion. Mod-severe stenosis of the dominant R vert.  1/12 CT Maxillofacial > Subtle fracture  of the posterior wall of the right maxillary sinus is new since September and likely acute. Trace fluid within the sinus. Hematoma/contusion involving the right parotid gland and regional subcutaneous tissues. Left forehead and periorbital hematoma without underlying fracture. 1/12 CT chest/abd/pelv > Numerous acute rib fractures: Right ribs 1 through 7 (with rib 2 through 4 flail segment) and left ribs 2 through 7. Mild intercostal hematoma, but no pneumothorax, hemothorax or pulmonary contusion. Low-density layering pleural effusions appear mildly increased from last month. Chronic atelectasis. MRI Brain 1/14 >> scattered small acute to early subacute cerebral and cerebellar infarcts, few scattered chronic cerebral microhemorrhages bilaterally, small amount of petechial blood associated with small left occipital infarct, possible more global hypoxic ischemic injury given subtle diffusion-weighted signal hyperintensity involving cortex posteriorly  Micro Data:  1/12 COVID/Flu >> negative 1/12 Urine Cultures > Negative 1/13 Blood Cultures >   Antimicrobials:  Ceftriaxone 1/13 >> 1/14; 1/16 >>  Cefepime 1/14 >> 1/16  Interim History / Subjective:  Low Vt and RR with SBT.  Remains on CRRT.  Objective   Blood pressure (!) 112/54, pulse 80, temperature 98.6 F (37 C), temperature source Rectal, resp. rate (!) 26, height 5\' 11"  (1.803 m), weight 81.2 kg, SpO2 95 %.    Vent Mode: PRVC FiO2 (%):  [30 %-50 %] 50 % Set Rate:  [24 bmp] 24 bmp Vt Set:  [600 mL] 600 mL PEEP:  [5 cmH20] 5 cmH20 Plateau Pressure:  [21 cmH20-26 cmH20] 26  cmH20   Intake/Output Summary (Last 24 hours) at 10/07/2020 0906 Last data filed at 10/07/2020 0900 Gross per 24 hour  Intake 2704.8 ml  Output 4897 ml  Net -2192.2 ml   Filed Weights   10/05/20 0500 10/06/20 0327 10/07/20 0500  Weight: 86.6 kg 84.6 kg 81.2 kg    General - ill appearing Eyes - pupils reactive ENT - ETT in place Cardiac - regular rate/rhythm,  no murmur Chest - b/l rhonchi Abdomen - soft, non tender, decreased bowel sounds Extremities - 2+ edema Skin - arms wrapped Neuro - moves extremities, not following commands   Resolved Hospital Problem list     Assessment & Plan:   Acute anoxic encephalopathy 2nd to PEA cardiac arrest, 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 - appreciate assistance from neurology - continue ASA - RASS goal 0 to -1  Acute hypoxic respiratory failure with compromised airway 2nd to PEA arrest, acute pulmonary edema and aspiration pneumonia. Rt pneumothorax after CPR. - day 6 of ABx, currently on rocephin - change Rt chest tube to water seal on 1/18 - f/u CXR - not able to tolerate vent weaning at this time  Hypotension from sepsis, relative adrenal insufficiency and low albumin state. - monitor hemodynamics - pressors for MAP < 60  AKI in setting of shock from PEA arrest. CKD s/p renal transplant. - appreciate help from nephrology - tentative plan to continue CRRT for now - continue cellcept, prednisone  PAF, chronic combined CHF. - monitor on tele  DM type 2 poorly controlled with hyperglycemia. - SSI  R, L rib fx: flail segments by CT on the right. R maxillary sinus wall fracture. -Pain management  Anemia of critical illness and chronic disease. Thrombocytopenia. - f/u CBC - transfuse for Hb < 7, PLT < 10K, or significant bleeding  Goals of care. - prognosis for meaningful recovery seems grim - consult palliative care  Best practice (evaluated daily)  Diet: Tube Feeds  DVT prophylaxis: SQH GI prophylaxis: Pepcid Mobility: BR Disposition: FULL Goals of care: full code Family: tried calling patients daughter, but no answer  D/w Dr. Leonel Ramsay and Dr. Joelyn Oms  Labs    CMP Latest Ref Rng & Units 10/07/2020 10/06/2020 10/06/2020  Glucose 70 - 99 mg/dL 241(H) 317(H) -  BUN 8 - 23 mg/dL 41(H) 36(H) -  Creatinine 0.61 - 1.24 mg/dL 1.32(H) 1.36(H) -  Sodium 135 -  145 mmol/L 136 138 -  Potassium 3.5 - 5.1 mmol/L 3.9 3.8 4.3  Chloride 98 - 111 mmol/L 100 102 -  CO2 22 - 32 mmol/L 24 23 -  Calcium 8.9 - 10.3 mg/dL 7.5(L) 7.3(L) -  Total Protein 6.5 - 8.1 g/dL - - -  Total Bilirubin 0.3 - 1.2 mg/dL - - -  Alkaline Phos 38 - 126 U/L - - -  AST 15 - 41 U/L - - -  ALT 0 - 44 U/L - - -    CBC Latest Ref Rng & Units 10/07/2020 10/06/2020 10/05/2020  WBC 4.0 - 10.5 K/uL 17.7(H) 14.3(H) 13.3(H)  Hemoglobin 13.0 - 17.0 g/dL 8.0(L) 7.1(L) 7.0(L)  Hematocrit 39.0 - 52.0 % 27.3(L) 24.4(L) 23.9(L)  Platelets 150 - 400 K/uL 96(L) 74(L) 78(L)    ABG    Component Value Date/Time   PHART 7.298 (L) 09/22/2020 1218   PCO2ART 49.4 (H) 09/21/2020 1218   PO2ART 85 10/16/2020 1218   HCO3 18.1 (L) 10/02/2020 1614   TCO2 19 (L) 10/02/2020 1614   ACIDBASEDEF 7.0 (H) 10/02/2020 1614  O2SAT 54.0 10/02/2020 1614    CBG (last 3)  Recent Labs    10/07/20 0010 10/07/20 0401 10/07/20 0738  GLUCAP 266* 229* 133*    Critical care time: 33 minutes  Chesley Mires, MD [place] Pager - [phone] 10/07/2020, 9:20 AM

## 2020-10-07 NOTE — Progress Notes (Signed)
Assisted tele visit to patient with family member.  Jrue Yambao P, RN  

## 2020-10-07 NOTE — Progress Notes (Signed)
SLP Cancellation Note  Patient Details Name: [name] MRN: [id number] DOB: 01-May-1946   Cancelled treatment:       Reason Eval/Treat Not Completed: Medical issues which prohibited therapy. Will sign off at this time   Lynann Beaver 10/07/2020, 7:28 AM

## 2020-10-07 NOTE — Progress Notes (Signed)
PT Cancellation Note  Patient Details Name: Sydney Azure MRN: [id number] DOB: 10/29/1945   Cancelled Treatment:    Reason Eval/Treat Not Completed: Patient not medically ready (will sign off and await new order when appropriate for therapy)   Sharis Keeran B Katheryne Gorr 10/07/2020, 6:36 AM  Bayard Males, PT Acute Rehabilitation Services Pager: [phone] Office: [phone]

## 2020-10-07 NOTE — Progress Notes (Signed)
Rt attempted abg x 2 but was unsuccessful. RN aware.

## 2020-10-07 NOTE — Progress Notes (Signed)
Assisted tele visit to patient with daughter.  Margaret Pyle, RN

## 2020-10-07 NOTE — Plan of Care (Signed)
  Problem: Health Behavior/Discharge Planning: Goal: Ability to manage health-related needs will improve Outcome: Progressing   Problem: Clinical Measurements: Goal: Will remain free from infection Outcome: Progressing Goal: Diagnostic test results will improve Outcome: Progressing Goal: Respiratory complications will improve Outcome: Progressing Goal: Cardiovascular complication will be avoided Outcome: Progressing   Problem: Nutrition: Goal: Adequate nutrition will be maintained Outcome: Progressing   Problem: Coping: Goal: Level of anxiety will decrease Outcome: Progressing   Problem: Elimination: Goal: Will not experience complications related to bowel motility Outcome: Progressing Goal: Will not experience complications related to urinary retention Outcome: Progressing   Problem: Pain Managment: Goal: General experience of comfort will improve Outcome: Progressing   Problem: Safety: Goal: Ability to remain free from injury will improve Outcome: Progressing   Problem: Skin Integrity: Goal: Risk for impaired skin integrity will decrease Outcome: Progressing   Problem: Education: Goal: Knowledge of disease or condition will improve Outcome: Progressing Goal: Knowledge of secondary prevention will improve Outcome: Progressing Goal: Knowledge of patient specific risk factors addressed and post discharge goals established will improve Outcome: Progressing   Problem: Coping: Goal: Will identify appropriate support needs Outcome: Progressing   Problem: Health Behavior/Discharge Planning: Goal: Ability to manage health-related needs will improve Outcome: Progressing   Problem: Ischemic Stroke/TIA Tissue Perfusion: Goal: Complications of ischemic stroke/TIA will be minimized Outcome: Progressing   Problem: Respiratory: Goal: Ability to maintain a clear airway and adequate ventilation will improve Outcome: Progressing

## 2020-10-07 NOTE — Progress Notes (Signed)
Spoke with pt's daughter.  Updated current status and treatment plan.  Explained that he hasn't shown improvement in neurologic function so far with CRRT.  Explained concern is that he has suffered neurologic injury from anoxia during cardiac arrest.  Family would like to continue current therapies through until Friday 1/21 as previously discussed with Dr. Lamonte Sakai.  They are open to have conversation with palliative care to assist with goals of care discussion.  Time spent 14 minutes.  Chesley Mires, MD [place] Pager - [phone] 10/07/2020, 12:05 PM

## 2020-10-07 NOTE — Progress Notes (Signed)
Subjective: No changes  Exam: Vitals:   10/07/20 0743 10/07/20 0745  BP: (!) 109/59 (!) 106/54  Pulse: 78 77  Resp: (!) 27 (!) 26  Temp:    SpO2: 95% 96%   Gen: In bed, intubated Eyes: Left eye is bloodshot Resp: Ventilated Abd: soft, nt  Neuro: MS: Opens eyes to noxious stimulation, he does not  engage the examiner, he does not follow commands despite multiple attempts. CN: Pupils reactive bilaterally, corneals intact, he resists doll's eye maneuver but eyes are slightly dysconjugate.  Motor: He withdraws all extremities to noxious stimulation, but does not clearly cross midline or clavicle Sensory: As above  Pertinent Labs: Creatinine 1.32   Impression: 76 yo M with persistent encephalopathy in the setting of cardiac arrest with suggestion of hypoxic injury on MRI in the posterior regiosn, multiple small infarcts. He did not follow commands for me today, and my suspicion is for a multifactorial process, though given the degree of his encephalopathy, I am concerned for significant hypoxic injury in addition to his strokes. I would continue to monitor for now to assess for improvement.   Recommendations: 1) neurology will continue to follow.  2) continue ASA for stroke prevention  3) aldready on low dose statin at home, no need for increase as unlikley to be atherogenic 4) continue DM management 5) will need therapy if he survives.   Roland Rack, MD [facility] [phone]  If 7pm- 7am, please page neurology on call as listed in [place].

## 2020-10-07 NOTE — Progress Notes (Signed)
Attempted video chat without success

## 2020-10-08 ENCOUNTER — Inpatient Hospital Stay (HOSPITAL_COMMUNITY): Payer: Medicare Other

## 2020-10-08 DIAGNOSIS — J9311 Primary spontaneous pneumothorax: Secondary | ICD-10-CM | POA: Diagnosis not present

## 2020-10-08 DIAGNOSIS — J9601 Acute respiratory failure with hypoxia: Secondary | ICD-10-CM | POA: Diagnosis not present

## 2020-10-08 DIAGNOSIS — G931 Anoxic brain damage, not elsewhere classified: Secondary | ICD-10-CM | POA: Diagnosis not present

## 2020-10-08 DIAGNOSIS — I469 Cardiac arrest, cause unspecified: Secondary | ICD-10-CM | POA: Diagnosis not present

## 2020-10-08 DIAGNOSIS — S270XXA Traumatic pneumothorax, initial encounter: Secondary | ICD-10-CM | POA: Diagnosis not present

## 2020-10-08 DIAGNOSIS — Z9689 Presence of other specified functional implants: Secondary | ICD-10-CM | POA: Diagnosis not present

## 2020-10-08 LAB — RENAL FUNCTION PANEL
Albumin: 1.6 g/dL — ABNORMAL LOW (ref 3.5–5.0)
Albumin: 2.3 g/dL — ABNORMAL LOW (ref 3.5–5.0)
Anion gap: 12 (ref 5–15)
Anion gap: 16 — ABNORMAL HIGH (ref 5–15)
BUN: 41 mg/dL — ABNORMAL HIGH (ref 8–23)
BUN: 48 mg/dL — ABNORMAL HIGH (ref 8–23)
CO2: 22 mmol/L (ref 22–32)
CO2: 18 mmol/L — ABNORMAL LOW (ref 22–32)
Calcium: 7.2 mg/dL — ABNORMAL LOW (ref 8.9–10.3)
Calcium: 6.9 mg/dL — ABNORMAL LOW (ref 8.9–10.3)
Chloride: 103 mmol/L (ref 98–111)
Chloride: 100 mmol/L (ref 98–111)
Creatinine, Ser: 1.26 mg/dL — ABNORMAL HIGH (ref 0.61–1.24)
Creatinine, Ser: 1.56 mg/dL — ABNORMAL HIGH (ref 0.61–1.24)
GFR, Estimated: >60 mL/min (ref >60)
GFR, Estimated: 47 mL/min — ABNORMAL LOW
Glucose, Bld: 111 mg/dL — ABNORMAL HIGH (ref 70–99)
Glucose, Bld: 239 mg/dL — ABNORMAL HIGH (ref 70–99)
Phosphorus: 2.7 mg/dL (ref 2.5–4.6)
Phosphorus: 7.1 mg/dL — ABNORMAL HIGH (ref 2.5–4.6)
Potassium: 4.2 mmol/L (ref 3.5–5.1)
Potassium: 6.5 mmol/L (ref 3.5–5.1)
Sodium: 137 mmol/L (ref 135–145)
Sodium: 134 mmol/L — ABNORMAL LOW (ref 135–145)

## 2020-10-08 LAB — CBC
HCT: 25.5 % — ABNORMAL LOW (ref 39.0–52.0)
Hemoglobin: 7.7 g/dL — ABNORMAL LOW (ref 13.0–17.0)
MCH: 29.5 pg (ref 26.0–34.0)
MCHC: 30.2 g/dL (ref 30.0–36.0)
MCV: 97.7 fL (ref 80.0–100.0)
Platelets: 80 10*3/uL — ABNORMAL LOW (ref 150–400)
RBC: 2.61 MIL/uL — ABNORMAL LOW (ref 4.22–5.81)
RDW: 17 % — ABNORMAL HIGH (ref 11.5–15.5)
WBC: 35 10*3/uL — ABNORMAL HIGH (ref 4.0–10.5)
nRBC: 1.2 % — ABNORMAL HIGH (ref 0.0–0.2)

## 2020-10-08 LAB — BASIC METABOLIC PANEL
Anion gap: 16 — ABNORMAL HIGH (ref 5–15)
BUN: 46 mg/dL — ABNORMAL HIGH (ref 8–23)
CO2: 20 mmol/L — ABNORMAL LOW (ref 22–32)
Calcium: 6.9 mg/dL — ABNORMAL LOW (ref 8.9–10.3)
Chloride: 100 mmol/L (ref 98–111)
Creatinine, Ser: 1.47 mg/dL — ABNORMAL HIGH (ref 0.61–1.24)
GFR, Estimated: 50 mL/min — ABNORMAL LOW (ref >60)
Glucose, Bld: 190 mg/dL — ABNORMAL HIGH (ref 70–99)
Potassium: 5.4 mmol/L — ABNORMAL HIGH (ref 3.5–5.1)
Sodium: 136 mmol/L (ref 135–145)

## 2020-10-08 LAB — GLUCOSE, CAPILLARY
Glucose-Capillary: 101 mg/dL — ABNORMAL HIGH (ref 70–99)
Glucose-Capillary: 130 mg/dL — ABNORMAL HIGH (ref 70–99)
Glucose-Capillary: 164 mg/dL — ABNORMAL HIGH (ref 70–99)
Glucose-Capillary: 187 mg/dL — ABNORMAL HIGH (ref 70–99)
Glucose-Capillary: 221 mg/dL — ABNORMAL HIGH (ref 70–99)
Glucose-Capillary: 86 mg/dL (ref 70–99)

## 2020-10-08 MED ORDER — LACTATED RINGERS IV BOLUS
1000.0000 mL | Freq: Once | INTRAVENOUS | Status: AC
  Administered 2020-10-08: 500 mL via INTRAVENOUS

## 2020-10-08 MED ORDER — SODIUM CHLORIDE 0.9 % IV SOLN
2.0000 g | Freq: Two times a day (BID) | INTRAVENOUS | Status: DC
  Administered 2020-10-08 – 2020-10-14 (×13): 2 g via INTRAVENOUS
  Filled 2020-10-08 (×13): qty 2

## 2020-10-08 MED ORDER — POTASSIUM PHOSPHATES 15 MMOLE/5ML IV SOLN
30.0000 mmol | Freq: Once | INTRAVENOUS | Status: AC
  Administered 2020-10-08: 30 mmol via INTRAVENOUS
  Filled 2020-10-08: qty 10

## 2020-10-08 MED ORDER — ALBUMIN HUMAN 25 % IV SOLN
50.0000 g | Freq: Once | INTRAVENOUS | Status: AC
  Administered 2020-10-08: 50 g via INTRAVENOUS
  Filled 2020-10-08: qty 200

## 2020-10-08 MED ORDER — LACTATED RINGERS IV BOLUS
250.0000 mL | Freq: Once | INTRAVENOUS | Status: AC
  Administered 2020-10-08: 250 mL via INTRAVENOUS

## 2020-10-08 MED ORDER — VASOPRESSIN 20 UNITS/100 ML INFUSION FOR SHOCK
0.0000 [IU]/min | INTRAVENOUS | Status: DC
  Administered 2020-10-08 – 2020-10-10 (×7): 0.03 [IU]/min via INTRAVENOUS
  Administered 2020-10-11 – 2020-10-14 (×10): 0.04 [IU]/min via INTRAVENOUS
  Filled 2020-10-08 (×17): qty 100

## 2020-10-08 NOTE — Progress Notes (Signed)
eLink Physician-Brief Progress Note Patient Name: [name] DOB: 02/20/1946 MRN: [id number]   Date of Service  10/08/2020  HPI/Events of Note  Patient with jump in WBC count associated with hypotension, increasing pressor requirements, inability to carryout ultrafiltration with CRRT, and increasing oxygen requirements.  eICU Interventions  Stat portable CXR ordered, pan cultures, Cefepime substituted for Rocephin, 50 gm of 25 %  Albumin given for serum Albumin of 1.8, and a 250 ml iv LR fluid bolus administered.        Kerry Kass [name] 10/08/2020, 3:50 AM

## 2020-10-08 NOTE — Progress Notes (Signed)
Met with pt's wife and one daughter in the room.  His other daughter was on phone.  Nurse Crystal was present.  Had lengthy discussion about current status and likely scenarios as we go ahead.  They agree that he should not undergo CPR or defibrillation if he develops cardiac arrest again.  Limited resuscitation order placed.  They would like to still continue other therapies for now to see if he can show signs of improvement, but understand that things are stacking up against him.  Time spent 17 minutes.  Chesley Mires, MD [place] Pager - [phone] 10/08/2020, 5:04 PM

## 2020-10-08 NOTE — Progress Notes (Signed)
[facility] Progress Note   22M s/p PEA arrest, persistent AMS, CKD4 s/p ddKT 2005 Tac/MMF/Pred  Assessment:   1. PEA cardiac arrest, out of hospital. CPR 12 min. Etiology unclear.  2. Septic shock, recurrent on pressors 3. Likely new pneumonia 4. AKI on CKD4, anuric. CRRT start date 10/03/20, line placement via CCM 1/14 5. CKD4, h/o renal transplant x 2 ('83 & '05). Baseline Cr ~2-2.2. Tac on hold 2/2 AMS #8 6. Anion gap metabolic acidosis; resolved 7. Hyperkalemia, mild; resolved 8. Encephalopathy, hypoxic and toxic metabolic, neuro following 9. 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 DM2 11. Hypophosphatemia  Plan:  Cont CRRT at current settings  Unlikely to survive, would rec ongoing [place] conversations  Cont 4K bath   Replete P  Cont to hold Tac  Subjective:    Sig resp decline overnight, likely PNA, on ABX, pressors, 100% FIO1  Cont on CRRT, tolerating  ALl 4K,  No heparin, infrequent clotting, R IJ Temp cath    Objective:   BP (!) 125/56   Pulse 74   Temp 97.7 F (36.5 C) (Oral)   Resp (!) 27   Ht 5' 11" (1.803 m)   Wt 80.2 kg   SpO2 100%   BMI 24.66 kg/m   Intake/Output Summary (Last 24 hours) at 10/08/2020 1000 Last data filed at 10/08/2020 0900 Gross per 24 hour  Intake 3533.04 ml  Output 3867 ml  Net -333.96 ml   Weight change: -1 kg  Physical Exam: Gen:ill appearing CVS:reg rate Resp:intubated Abd:nd Ext: anasarca Neuro: sedated, unresponsive Access: rij temp line c/d/i  Imaging: DG Neck Soft Tissue  Result Date: 10/06/2020 CLINICAL DATA:  Possible fluid or air around catheter EXAM: NECK SOFT TISSUES - 1+ VIEW COMPARISON:  CT 09/24/2020 FINDINGS: Limited by patient condition. Right subclavian central venous catheter tip incompletely visualized. Right jugular venous catheter with tip over the SVC. Moderate gas within the soft tissues of the right greater than left neck. Possible small amount of air within  the upper mediastinum. Multiple right upper rib fractures. IMPRESSION: 1. Limited by patient condition. 2. Moderate gas/air within the soft tissues of the right greater than left neck. Possible small amount of air within the upper mediastinum. Electronically Signed   By: Donavan Foil M.D.   On: 10/06/2020 17:52   DG Chest Port 1 View  Result Date: 10/08/2020 CLINICAL DATA:  Respiratory failure. EXAM: PORTABLE CHEST 1 VIEW COMPARISON:  10/07/2020.  CT 10/06/2020. FINDINGS: Endotracheal tube, NG tube, right IJ line, right subclavian line, right chest tube in stable position. Stable cardiomegaly. Persistent left mid lung and bibasilar infiltrates/edema. Persistent left-sided moderate pleural effusion. Small left apical pneumothorax again noted. No right pneumothorax noted. Right chest wall subcutaneous emphysema again noted. Bilateral rib fractures again noted. IMPRESSION: 1. Lines and tubes including right chest tube in stable position. Small left apical pneumothorax again noted. No right pneumothorax noted. Right chest wall subcutaneous emphysema again noted. 2. Persistent left mid lung and bibasilar infiltrates/edema. Persistent left-sided moderate pleural effusion. Similar findings noted on prior exam. Electronically Signed   By: [place]   On: 10/08/2020 05:30   DG Chest Port 1 View  Result Date: 10/07/2020 CLINICAL DATA:  Subcutaneous emphysema EXAM: PORTABLE CHEST 1 VIEW COMPARISON:  Multiple prior studies most recent October 07, 2020 FINDINGS: Endotracheal tube remains within the trachea between the clavicular heads in grossly similar position accounting for changes in patient position and rotation on the current radiograph. RIGHT-sided central  venous access device RIGHT IJ catheter in the mid to distal superior vena cava. Subclavian line terminates at the caval to atrial junction. Gastric tube in place. Cardiomediastinal contours grossly stable, largely obscured in the LEFT chest as before  though with increasing opacity in the LEFT mid chest compared to the previous radiograph. RIGHT lower lobe airspace disease persists with pigtail catheter in the RIGHT chest and signs of subcutaneous emphysema grossly similar with respect imaged portions of the chest compared to prior study. Some lucency along the RIGHT inferior chest is similar to the previous study. Lucency along the RIGHT lateral chest may represent skin fold as there are lung markings that are seen peripherally or anterior component to small pneumothorax in the RIGHT chest. Possible small LEFT pneumothorax grossly similar, not well evaluated due to degree of rotation. Rib fractures not well evaluated on the current study. IMPRESSION: 1. Increasing opacity in the LEFT mid and lower chest compared to previous radiograph. May be due to effusion or increasing volume loss. Subtle lucency at the LEFT lung apex is grossly similar though not well evaluated potentially small pneumothorax. 2. Persistent RIGHT lower lobe airspace disease with pigtail catheter in place. 3. Possible small RIGHT basilar pneumothorax versus is artifact from adjacent consolidative lung likely in RIGHT middle lobe and hemidiaphragm also with subtle lucency along the RIGHT lateral chest, potentially a skin fold. Findings not changed with RIGHT-sided chest tube in place. Attention on follow-up 4. Subcutaneous emphysema as before. 5. Cardiomegaly with cardiac calcifications. These results will be called to the ordering clinician or representative by the Radiologist Assistant, and communication documented in the PACS or [employer]. Electronically Signed   By: Zetta Bills M.D.   On: 10/07/2020 10:54   DG Chest Port 1 View  Result Date: 10/07/2020 CLINICAL DATA:  Pneumothorax. EXAM: PORTABLE CHEST 1 VIEW COMPARISON:  10/07/2020. FINDINGS: Endotracheal tube, NG tube, right IJ sheath, right subclavian line in stable position. Right chest tube in stable position. No  pneumothorax on the right noted. Again tiny left apical pneumothorax may be present. Pneumomediastinum again cannot be excluded. Stable cardiomegaly. Diffuse bilateral pulmonary infiltrates/edema again noted. Bilateral pleural effusions again noted. Bilateral chest wall subcutaneous emphysema again noted. IMPRESSION: 1. Lines and tubes in stable position. Right chest tube in stable position. No pneumothorax on the right noted. Again tiny left apical pneumothorax may be present. Pneumomediastinum again cannot be excluded. Bilateral chest wall subcutaneous emphysema again noted. 2.  Cardiomegaly. 3. Diffuse bilateral pulmonary infiltrates/edema and bilateral pleural effusions again noted without interim change. Electronically Signed   By: Marcello Moores  Register   On: 10/07/2020 05:21   DG Chest Port 1 View  Result Date: 10/07/2020 CLINICAL DATA:  Pneumothorax EXAM: PORTABLE CHEST 1 VIEW COMPARISON:  October 06, 2020 FINDINGS: The right-sided chest tube is stable in positioning. Again noted is a possible small left apical pneumothorax. No definite right-sided pneumothorax. The lines and tubes are essentially stable in positioning. Bilateral airspace opacities are noted. There is a dense retrocardiac opacity with a probable left-sided pleural effusion. Again noted is subcutaneous emphysema involving the patient's chest wall and neck. There is questionable pneumomediastinum. IMPRESSION: 1. Lines and tubes are essentially stable in positioning. 2. Possible small left apical pneumothorax, unchanged from prior. 3. Persistent bilateral airspace opacities and probable left-sided pleural effusion. 4. Persistent subcutaneous emphysema.  Probable pneumomediastinum. Electronically Signed   By: Constance Holster M.D.   On: 10/07/2020 00:43   DG Chest Port 1 View  Result Date: 10/06/2020 CLINICAL  DATA:  Intubated concern for air around right catheter EXAM: PORTABLE CHEST 1 VIEW COMPARISON:  10/05/2020, 10/04/2020, 10/03/2020  FINDINGS: Endotracheal tube tip is about 4 cm superior to the carina. Esophageal tube tip below the diaphragm but incompletely visualized. Right IJ central venous catheter tip over the SVC. Right subclavian central venous catheter tip over the proximal right atrium. Interval finding of moderate soft tissue emphysema over the right greater than left neck and right chest wall. Right lower chest tube remains in place. New pneumomediastinum. Similar left greater than right basilar airspace disease. Enlarged cardiomediastinal silhouette. Possible small left apical pneumothorax. Multiple bilateral rib fractures. IMPRESSION: 1. Interval finding of moderate soft tissue emphysema over the neck and right chest wall with new pneumomediastinum. Possible small left apical pneumothorax. 2. Similar cardiomegaly and left greater than right basilar airspace disease. These results will be called to the ordering clinician or representative by the Radiologist Assistant, and communication documented in the PACS or [employer]. Electronically Signed   By: Donavan Foil M.D.   On: 10/06/2020 17:58    Labs: BMET Recent Labs  Lab 10/05/20 0311 10/05/20 1629 10/06/20 0312 10/06/20 0728 10/06/20 1618 10/07/20 0356 10/07/20 1520 10/08/20 0307  NA 138 138 138  --  138 136 134* 137  K 3.6 3.8 4.1 4.3 3.8 3.9 4.7 4.2  CL 104 103 102  --  102 100 99 103  CO2 _0 --  _1 GLUCOSE 225* 346* 220*  --  317* 241* 239* 111*  BUN 34* 35* 33*  --  36* 41* 46* 41*  CREATININE 2.24* 1.80* 1.54*  --  1.36* 1.32* 1.50* 1.26*  CALCIUM 7.6* 7.5* 7.6*  --  7.3* 7.5* 7.0* 7.2*  PHOS 2.0* 1.5* <1.0* 1.6* 1.5* 1.6* 3.4 2.7   CBC Recent Labs  Lab 10/04/20 1619 10/05/20 0311 10/06/20 0312 10/07/20 0356 10/08/20 0205  WBC 10.6* 13.3* 14.3* 17.7* 35.0*  NEUTROABS 9.6*  --   --   --   --   HGB 7.1* 7.0* 7.1* 8.0* 7.7*  HCT 24.1* 23.9* 24.4* 27.3* 25.5*  MCV 97.2 96.8 96.1 96.1 97.7  PLT 103* 78* 74* 96* 80*     Medications:    . aspirin  81 mg Per Tube Daily  . atorvastatin  10 mg Per Tube Daily  . chlorhexidine gluconate (MEDLINE KIT)  15 mL Mouth Rinse BID  . Chlorhexidine Gluconate Cloth  6 each Topical Daily  . famotidine  20 mg Per Tube Daily  . feeding supplement (PROSource TF)  45 mL Per Tube QID  . heparin  5,000 Units Subcutaneous Q8H  . insulin aspart  0-20 Units Subcutaneous Q4H  . insulin aspart  4 Units Subcutaneous Q6H  . insulin glargine  20 Units Subcutaneous Daily  . latanoprost  1 drop Both Eyes QHS  . mouth rinse  15 mL Mouth Rinse 10 times per day  . mycophenolate  500 mg Per Tube BID  . predniSONE  5 mg Per Tube q AM  . sodium chloride flush  10 mL Intracatheter Q8H  . sodium chloride flush  10-40 mL Intracatheter Q12H   Rexene Agent, MD  10/08/2020, 10:00 AM

## 2020-10-08 NOTE — Progress Notes (Signed)
k of 6.5 @ 0600 felt to be inaccurate. Redraw k 5.4

## 2020-10-08 NOTE — Procedures (Signed)
Insertion of Chest Tube Procedure Note  [name]  [id number]  11/26/1945  Date:10/08/20  Time:4:34 PM    Provider Performing: Chesley Mires   Procedure: Chest Tube Insertion (44920)  Indication(s) Pneumothorax  Consent Unable to obtain consent due to emergent nature of procedure.  Anesthesia Topical only with 1% lidocaine    Time Out Verified patient identification, verified procedure, site/side was marked, verified correct patient position, special equipment/implants available, medications/allergies/relevant history reviewed, required imaging and test results available.   Sterile Technique Maximal sterile technique including full sterile barrier drape, hand hygiene, sterile gown, sterile gloves, mask, hair covering, sterile ultrasound probe cover (if used).   Procedure Description Ultrasound not used to identify appropriate pleural anatomy for placement and overlying skin marked. Area of placement cleaned and draped in sterile fashion.  A 12 French pigtail pleural catheter was placed into the left pleural space using Seldinger technique. Appropriate return of fluid was obtained.  The tube was connected to atrium and placed on -20 cm H2O wall suction.   Complications/Tolerance None; patient tolerated the procedure well. Chest X-ray is ordered to verify placement.   EBL Minimal  Specimen(s) none   Chesley Mires, MD [place] Pager - [phone] 10/08/2020, 4:34 PM

## 2020-10-08 NOTE — Progress Notes (Signed)
NAME:  [name], MRN:  [id number], DOB:  26-Nov-1945, LOS: 7 ADMISSION DATE:  10/15/2020, CONSULTATION DATE:  1/12 REFERRING MD:  Dr. Rogene Houston, CHIEF COMPLAINT:  Cardiac arrest   Brief History:  75 year old male with renal transplant history admitted 1/12 after PEA arrest 12 minutes.  History of Present Illness:  75 year old male with PMH as below, which is significant for CKD (previously on HD, but is s/p renal transplant in 2005 at [facility]), AF not on [facility], who presented to [name] ED after cardiac arrest athome 1/12. Apparently 1/11 in the evening hours he developed chest pain and dyspnea, but proceeded to go to bed. Then 1/12 AM his wife awoke around 0930 and found him to be unresponsive and foaming at the mouth. He was still breathing. As she called EMS he fell to the floor. Upon EMS arrival the patient was pulseless and ACLS was initiated. CPR was performed for approximately 12 minutes with two doses of epinephrine. He suffered injury to his L periorbit at some point during all this, raising concern for head injury and he was treated as a level one trauma in ED. Upon arrival he was unresponsive and was immediately intubated. He was emergently taken for pan-CT scan. No operable injury was identified. PCCM asked to admit.   Past Medical History:   Paroxysmal AF (no AC)  ESRD S/p Renal transplant x2  HFrEF  HTN  HLD  T2DM  PAD  Significant Hospital Events:  1/12 admit for PEA arrest 1/13 Pneumothorax development with pigtail catheter placed. Patient had increasing pressor requirements.  1/14 start CRRT 1/18 change chest tube to water seal  Consults:  Neurosurgery (S/o) Trauma surgery (S/o) Nephrology Neurology  Palliative care  Procedures:  CVL R subclavian 1/12 > ETT 1/12 > Pigtail catheter 1/14 >  R IJ HD cath 1/14 >>   Significant Diagnostic Tests:  1/12 CTA head > no large vessel occlusion. Mod-severe stenosis of the dominant R vert.  1/12 CT Maxillofacial  > Subtle fracture of the posterior wall of the right maxillary sinus is new since September and likely acute. Trace fluid within the sinus. Hematoma/contusion involving the right parotid gland and regional subcutaneous tissues. Left forehead and periorbital hematoma without underlying fracture. 1/12 CT chest/abd/pelv > Numerous acute rib fractures: Right ribs 1 through 7 (with rib 2 through 4 flail segment) and left ribs 2 through 7. Mild intercostal hematoma, but no pneumothorax, hemothorax or pulmonary contusion. Low-density layering pleural effusions appear mildly increased from last month. Chronic atelectasis. MRI Brain 1/14 >> scattered small acute to early subacute cerebral and cerebellar infarcts, few scattered chronic cerebral microhemorrhages bilaterally, small amount of petechial blood associated with small left occipital infarct, possible more global hypoxic ischemic injury given subtle diffusion-weighted signal hyperintensity involving cortex posteriorly  Micro Data:  1/12 COVID/Flu >> negative 1/12 Urine Cultures > Negative 1/13 Blood Cultures > negative 1/19 Sputum >> 1/19 Blood  Antimicrobials:  Ceftriaxone 1/13 >> 1/14; 1/16 >> 1/18  Cefepime 1/14 >> 1/16 Cefepime 1/18 >>   Interim History / Subjective:  Increased pressors needs overnight and ABx changed.  Objective   Blood pressure (!) 118/51, pulse 74, temperature 97.7 F (36.5 C), temperature source Oral, resp. rate (!) 24, height 5\' 11"  (1.803 m), weight 80.2 kg, SpO2 100 %.    Vent Mode: PRVC FiO2 (%):  [40 %-100 %] 80 % Set Rate:  [24 bmp] 24 bmp Vt Set:  [0.6 mL-600 mL] 600 mL PEEP:  [5  cmH20] 5 cmH20 Plateau Pressure:  [23 cmH20-33 cmH20] 25 cmH20   Intake/Output Summary (Last 24 hours) at 10/08/2020 1130 Last data filed at 10/08/2020 1000 Gross per 24 hour  Intake 3659.37 ml  Output 3867 ml  Net -207.63 ml   Filed Weights   10/06/20 0327 10/07/20 0500 10/08/20 0500  Weight: 84.6 kg 81.2 kg 80.2 kg     General - ill appearing Eyes - ecchymosis around Lt eye with scleral injection ENT - ETT in place Cardiac - regular rate/rhythm, no murmur Chest - b/l crackles, crepitus in Rt upper chest, Rt chest tube in place w/o air leak Abdomen - soft, non tender, decreased bowel sounds Extremities - arms in wraps Skin - venous stasis changes Neuro - withdraws to some stimuli  Resolved Hospital Problem list     Assessment & Plan:   Acute anoxic encephalopathy 2nd to PEA cardiac arrest, 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 - appreciate assistance from neurology - continue ASA - RASS goal 0 to -1  Acute hypoxic respiratory failure with compromised airway 2nd to PEA arrest, acute pulmonary edema and aspiration pneumonia. Rt pneumothorax after CPR. - day 7 of ABx; changed back to cefepime on 1/18 in setting of worsening hypotension - f/u cultures - Rt chest tube to water seal - f/u CXR - not a candidate for vent weaning at this time  Hypotension from sepsis, relative adrenal insufficiency and low albumin state. - pressors to keep MAP > 65  AKI in setting of shock from PEA arrest. CKD s/p renal transplant. - appreciate help from nephrology - plan to continue CRRT through 1/21 and then reassess - continue cellcept, prednisone  PAF, chronic combined CHF. - monitor on tele  DM type 2 poorly controlled with hyperglycemia. - SSI  R, L rib fx: flail segments by CT on the right. R maxillary sinus wall fracture. -Pain management  Anemia of critical illness and chronic disease. Thrombocytopenia. - f/u CBC - transfuse for Hb < 7, PLT < 10K, or significant bleeding  Goals of care. - prognosis for meaningful recovery seems grim - palliative care consulted - family will like to give until 1/21 on CRRT before making further decisions about goals of care  Best practice (evaluated daily)  Diet: Tube Feeds  DVT prophylaxis: SQH GI prophylaxis: Pepcid Mobility:  BR Disposition: FULL Goals of care: full code Family: spoke with pt's daughter over the phone on 1/18  D/w Dr. Leonel Ramsay  Labs    CMP Latest Ref Rng & Units 10/08/2020 10/07/2020 10/07/2020  Glucose 70 - 99 mg/dL 111(H) 239(H) 241(H)  BUN 8 - 23 mg/dL 41(H) 46(H) 41(H)  Creatinine 0.61 - 1.24 mg/dL 1.26(H) 1.50(H) 1.32(H)  Sodium 135 - 145 mmol/L 137 134(L) 136  Potassium 3.5 - 5.1 mmol/L 4.2 4.7 3.9  Chloride 98 - 111 mmol/L 103 99 100  CO2 22 - 32 mmol/L 22 22 24   Calcium 8.9 - 10.3 mg/dL 7.2(L) 7.0(L) 7.5(L)  Total Protein 6.5 - 8.1 g/dL - - -  Total Bilirubin 0.3 - 1.2 mg/dL - - -  Alkaline Phos 38 - 126 U/L - - -  AST 15 - 41 U/L - - -  ALT 0 - 44 U/L - - -    CBC Latest Ref Rng & Units 10/08/2020 10/07/2020 10/06/2020  WBC 4.0 - 10.5 K/uL 35.0(H) 17.7(H) 14.3(H)  Hemoglobin 13.0 - 17.0 g/dL 7.7(L) 8.0(L) 7.1(L)  Hematocrit 39.0 - 52.0 % 25.5(L) 27.3(L) 24.4(L)  Platelets 150 - 400 K/uL 80(L)  96(L) 74(L)    ABG    Component Value Date/Time   PHART 7.298 (L) 10/13/2020 1218   PCO2ART 49.4 (H) 09/27/2020 1218   PO2ART 85 10/04/2020 1218   HCO3 18.1 (L) 10/02/2020 1614   TCO2 19 (L) 10/02/2020 1614   ACIDBASEDEF 7.0 (H) 10/02/2020 1614   O2SAT 54.0 10/02/2020 1614    CBG (last 3)  Recent Labs    10/08/20 0333 10/08/20 0818 10/08/20 1125  GLUCAP 101* 130* 164*    Critical care time: 33 minutes  Chesley Mires, MD [place] Pager - [phone] 10/08/2020, 11:30 AM

## 2020-10-08 NOTE — Progress Notes (Signed)
[facility] Progress Note Patient Name: [name] DOB: [birth year] MRN: [id number]   Date of Service  10/08/2020  HPI/Events of Note  Patient's arms are not available for drawing blood cultures so the phlebotomist is requesting permission to draw blood from the lower extremities.  eICU Interventions  Permission granted  to draw blood from the lower extremity with meticulous attention to sterile technique.        Geneve Kimpel U Wanetta Funderburke 10/08/2020, 6:30 AM

## 2020-10-08 NOTE — Progress Notes (Signed)
I was in room with pt's nurse and his wife.  Reviewing goals of care.  During this conversation, patient was noted to developed subcutaneous emphysema on left.  Stat CXR showed new large left pneumothorax with subcutaneous emphysema.  Right chest tube remained in good position.  Placed left pig tail catheter w/o difficulty and connected to -20 suction.    Chesley Mires, MD [place] Pager - [phone] 10/08/2020, 4:42 PM

## 2020-10-08 NOTE — Progress Notes (Signed)
Subjective: Slightly more awake today.   Exam: Vitals:   10/08/20 0700 10/08/20 0753  BP: (!) 94/46 (!) 105/51  Pulse: 75 (!) 58  Resp: (!) 25 (!) 24  Temp:    SpO2: 100% 100%   Gen: In bed, intubated Eyes: Left eye is bloodshot Resp: Ventilated Abd: soft, nt  Neuro: MS: Eyes are open, he attempts to turn his head towards voice, but does not follow commands.  CN: Pupils reactive bilaterally, corneals intact  Motor: He withdraws all extremities to noxious stimulation, but does not clearly cross midline or clavicle. He does appear to be attempting to go for his ET tube at one point, but does not get his hand there.  Sensory: As above  Pertinent Labs: Creatinine 1.26   Impression: 75 yo M with persistent encephalopathy in the setting of cardiac arrest with suggestion of hypoxic injury on MRI in the posterior regiosn, multiple small infarcts. My suspicion is for a multifactorial process, though given the degree of his encephalopathy, I am concerned for significant hypoxic injury in addition to his strokes. I would continue to monitor for now to assess for improvement.   Recommendations: 1) neurology will continue to follow.  2) continue ASA for stroke prevention  3) aldready on low dose statin at home, no need for increase as unlikley to be atherogenic 4) continue DM management 5) will need therapy if he survives.  6) Agree with palliative care involvement, though he has had some small  improvement neurologically, his progress is slow and he has multiple medical issues and requiring multiple pressors currently.   Roland Rack, MD [facility] [phone]  If 7pm- 7am, please page neurology on call as listed in [place].

## 2020-10-09 ENCOUNTER — Inpatient Hospital Stay (HOSPITAL_COMMUNITY): Payer: Medicare Other

## 2020-10-09 DIAGNOSIS — Z7189 Other specified counseling: Secondary | ICD-10-CM | POA: Diagnosis not present

## 2020-10-09 DIAGNOSIS — I469 Cardiac arrest, cause unspecified: Secondary | ICD-10-CM | POA: Diagnosis not present

## 2020-10-09 DIAGNOSIS — Z515 Encounter for palliative care: Secondary | ICD-10-CM | POA: Diagnosis not present

## 2020-10-09 DIAGNOSIS — J9601 Acute respiratory failure with hypoxia: Secondary | ICD-10-CM | POA: Diagnosis not present

## 2020-10-09 DIAGNOSIS — G931 Anoxic brain damage, not elsewhere classified: Secondary | ICD-10-CM | POA: Diagnosis not present

## 2020-10-09 LAB — RENAL FUNCTION PANEL
Albumin: 2 g/dL — ABNORMAL LOW (ref 3.5–5.0)
Albumin: 2 g/dL — ABNORMAL LOW (ref 3.5–5.0)
Anion gap: 12 (ref 5–15)
Anion gap: 13 (ref 5–15)
BUN: 38 mg/dL — ABNORMAL HIGH (ref 8–23)
BUN: 30 mg/dL — ABNORMAL HIGH (ref 8–23)
CO2: 23 mmol/L (ref 22–32)
CO2: 21 mmol/L — ABNORMAL LOW (ref 22–32)
Calcium: 6.8 mg/dL — ABNORMAL LOW (ref 8.9–10.3)
Calcium: 7 mg/dL — ABNORMAL LOW (ref 8.9–10.3)
Chloride: 103 mmol/L (ref 98–111)
Chloride: 101 mmol/L (ref 98–111)
Creatinine, Ser: 1.28 mg/dL — ABNORMAL HIGH (ref 0.61–1.24)
Creatinine, Ser: 1.25 mg/dL — ABNORMAL HIGH (ref 0.61–1.24)
GFR, Estimated: 59 mL/min — ABNORMAL LOW (ref >60)
GFR, Estimated: >60 mL/min (ref >60)
Glucose, Bld: 137 mg/dL — ABNORMAL HIGH (ref 70–99)
Glucose, Bld: 196 mg/dL — ABNORMAL HIGH (ref 70–99)
Phosphorus: 3.5 mg/dL (ref 2.5–4.6)
Phosphorus: 3.6 mg/dL (ref 2.5–4.6)
Potassium: 4.7 mmol/L (ref 3.5–5.1)
Potassium: 5.3 mmol/L — ABNORMAL HIGH (ref 3.5–5.1)
Sodium: 138 mmol/L (ref 135–145)
Sodium: 135 mmol/L (ref 135–145)

## 2020-10-09 LAB — URINE CULTURE: Culture: NO GROWTH

## 2020-10-09 LAB — CBC
HCT: 20.1 % — ABNORMAL LOW (ref 39.0–52.0)
HCT: 24.8 % — ABNORMAL LOW (ref 39.0–52.0)
Hemoglobin: 6.3 g/dL — CL (ref 13.0–17.0)
Hemoglobin: 8.1 g/dL — ABNORMAL LOW (ref 13.0–17.0)
MCH: 29.6 pg (ref 26.0–34.0)
MCH: 29.3 pg (ref 26.0–34.0)
MCHC: 31.3 g/dL (ref 30.0–36.0)
MCHC: 32.7 g/dL (ref 30.0–36.0)
MCV: 94.4 fL (ref 80.0–100.0)
MCV: 89.9 fL (ref 80.0–100.0)
Platelets: 61 10*3/uL — ABNORMAL LOW (ref 150–400)
Platelets: 60 10*3/uL — ABNORMAL LOW (ref 150–400)
RBC: 2.13 MIL/uL — ABNORMAL LOW (ref 4.22–5.81)
RBC: 2.76 MIL/uL — ABNORMAL LOW (ref 4.22–5.81)
RDW: 17.1 % — ABNORMAL HIGH (ref 11.5–15.5)
RDW: 18.4 % — ABNORMAL HIGH (ref 11.5–15.5)
WBC: 32.4 10*3/uL — ABNORMAL HIGH (ref 4.0–10.5)
WBC: 42.3 10*3/uL — ABNORMAL HIGH (ref 4.0–10.5)
nRBC: 0.7 % — ABNORMAL HIGH (ref 0.0–0.2)
nRBC: 0.4 % — ABNORMAL HIGH (ref 0.0–0.2)

## 2020-10-09 LAB — GLUCOSE, CAPILLARY
Glucose-Capillary: 106 mg/dL — ABNORMAL HIGH (ref 70–99)
Glucose-Capillary: 107 mg/dL — ABNORMAL HIGH (ref 70–99)
Glucose-Capillary: 107 mg/dL — ABNORMAL HIGH (ref 70–99)
Glucose-Capillary: 117 mg/dL — ABNORMAL HIGH (ref 70–99)
Glucose-Capillary: 131 mg/dL — ABNORMAL HIGH (ref 70–99)
Glucose-Capillary: 136 mg/dL — ABNORMAL HIGH (ref 70–99)
Glucose-Capillary: 146 mg/dL — ABNORMAL HIGH (ref 70–99)
Glucose-Capillary: 153 mg/dL — ABNORMAL HIGH (ref 70–99)
Glucose-Capillary: 30 mg/dL — CL (ref 70–99)
Glucose-Capillary: 81 mg/dL (ref 70–99)
Glucose-Capillary: 97 mg/dL (ref 70–99)

## 2020-10-09 LAB — ABO/RH: ABO/RH(D): O POS

## 2020-10-09 LAB — PREPARE RBC (CROSSMATCH)

## 2020-10-09 MED ORDER — DEXTROSE 50 % IV SOLN
INTRAVENOUS | Status: AC
  Administered 2020-10-09: 50 mL via INTRAVENOUS
  Filled 2020-10-09: qty 50

## 2020-10-09 MED ORDER — DEXTROSE 10 % IV SOLN: INTRAVENOUS | Status: AC

## 2020-10-09 MED ORDER — DEXTROSE 50 % IV SOLN: 1.0000 | Freq: Once | INTRAVENOUS | Status: AC

## 2020-10-09 MED ORDER — SODIUM CHLORIDE 0.9% IV SOLUTION: Freq: Once | INTRAVENOUS | Status: DC

## 2020-10-09 NOTE — Progress Notes (Signed)
NAME:  [name], MRN:  [id number], DOB:  04-May-1946, LOS: 8 ADMISSION DATE:  09/24/2020, CONSULTATION DATE:  1/12 REFERRING MD:  Dr. Rogene Houston, CHIEF COMPLAINT:  Cardiac arrest   Brief History:  75 year old male with renal transplant history admitted 1/12 after PEA arrest 12 minutes.  History of Present Illness:  75 year old male with PMH as below, which is significant for CKD (previously on HD, but is s/p renal transplant in 2005 at [facility]), AF not on [facility], who presented to [name] ED after cardiac arrest athome 1/12. Apparently 1/11 in the evening hours he developed chest pain and dyspnea, but proceeded to go to bed. Then 1/12 AM his wife awoke around 0930 and found him to be unresponsive and foaming at the mouth. He was still breathing. As she called EMS he fell to the floor. Upon EMS arrival the patient was pulseless and ACLS was initiated. CPR was performed for approximately 12 minutes with two doses of epinephrine. He suffered injury to his L periorbit at some point during all this, raising concern for head injury and he was treated as a level one trauma in ED. Upon arrival he was unresponsive and was immediately intubated. He was emergently taken for pan-CT scan. No operable injury was identified. PCCM asked to admit.   Past Medical History:   Paroxysmal AF (no AC)  ESRD S/p Renal transplant x2  HFrEF  HTN  HLD  T2DM  PAD  Significant Hospital Events:  1/12 admit for PEA arrest 1/13 Pneumothorax development with pigtail catheter placed. Patient had increasing pressor requirements.  1/14 start CRRT 1/18 change chest tube to water seal 1/19 new Lt pneumothorax; changed to no CPR/defibrillation 1/20 transfuse PRBC  Consults:  Neurosurgery (S/o) Trauma surgery (S/o) Nephrology Neurology  Palliative care  Procedures:  CVL R subclavian 1/12 > ETT 1/12 > Rt pigtail catheter 1/14 >  R IJ HD cath 1/14 >>  Lt pigtail catheter 1/19 >>   Significant Diagnostic Tests:   1/12 CTA head > no large vessel occlusion. Mod-severe stenosis of the dominant R vert.  1/12 CT Maxillofacial > Subtle fracture of the posterior wall of the right maxillary sinus is new since September and likely acute. Trace fluid within the sinus. Hematoma/contusion involving the right parotid gland and regional subcutaneous tissues. Left forehead and periorbital hematoma without underlying fracture. 1/12 CT chest/abd/pelv > Numerous acute rib fractures: Right ribs 1 through 7 (with rib 2 through 4 flail segment) and left ribs 2 through 7. Mild intercostal hematoma, but no pneumothorax, hemothorax or pulmonary contusion. Low-density layering pleural effusions appear mildly increased from last month. Chronic atelectasis. MRI Brain 1/14 >> scattered small acute to early subacute cerebral and cerebellar infarcts, few scattered chronic cerebral microhemorrhages bilaterally, small amount of petechial blood associated with small left occipital infarct, possible more global hypoxic ischemic injury given subtle diffusion-weighted signal hyperintensity involving cortex posteriorly  Micro Data:  1/12 COVID/Flu >> negative 1/12 Urine Cultures > Negative 1/13 Blood Cultures > negative 1/19 Sputum >> 1/19 Blood >>  1/19 urine >>   Antimicrobials:  Ceftriaxone 1/13 >> 1/14; 1/16 >> 1/18  Cefepime 1/14 >> 1/16 Cefepime 1/18 >>   Interim History / Subjective:  No change in neuro status.  Objective   Blood pressure (!) 119/56, pulse 75, temperature (!) 97.3 F (36.3 C), temperature source Oral, resp. rate (!) 24, height 5\' 11"  (1.803 m), weight 77.8 kg, SpO2 100 %.    Vent Mode: PRVC FiO2 (%):  [  80 %] 80 % Set Rate:  [24 bmp] 24 bmp Vt Set:  [600 mL] 600 mL PEEP:  [5 cmH20] 5 cmH20 Plateau Pressure:  [22 cmH20-26 cmH20] 26 cmH20   Intake/Output Summary (Last 24 hours) at 10/09/2020 0956 Last data filed at 10/09/2020 0900 Gross per 24 hour  Intake 2891.32 ml  Output 3664 ml  Net -772.68 ml    Filed Weights   10/07/20 0500 10/08/20 0500 10/09/20 0500  Weight: 81.2 kg 80.2 kg 77.8 kg    General - ill appearing Eyes - ecchymosis around Lt eye ENT - ETT in place Cardiac - regular rate/rhythm, no murmur Chest - b/l crepitus, b/l chest tubes w/o air leak, scattered rhonchi Abdomen - soft, non tender, decreased bowel sounds Extremities - arms in wraps Skin - areas of ecchymosis Neuro - not following commands  Resolved Hospital Problem list     Assessment & Plan:   Acute anoxic encephalopathy 2nd to PEA cardiac arrest, 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 - no neurologic improvement thus far  - appreciate assistance from neurology - continue ASA - RASS goal 0 to -1  Acute hypoxic respiratory failure with compromised airway 2nd to PEA arrest, acute pulmonary edema and aspiration pneumonia. Rt pneumothorax after CPR. Spontaneous Lt pneumothorax on 1/19. - Day 8 of ABx; changed back to cefepime on 1/18 in setting of worsening hypotension - f/u cultures - has b/l chest tubes - f/u CXR - not a candidate for vent weaning at this time  Hypotension from sepsis, relative adrenal insufficiency and low albumin state. - pressors to keep MAP > 65  AKI in setting of shock from PEA arrest. CKD s/p renal transplant. - appreciate help from nephrology - plan to continue CRRT through 1/21 and then reassess - continue cellcept, prednisone  PAF, chronic combined CHF. - monitor on tele  DM type 2 poorly controlled with hyperglycemia. Having episodes of hypoglycemia on 1/20. - SSI - dextrose in IV fluids as he hasn't been able to tolerate tube feeds  R, L rib fx: flail segments by CT on the right. R maxillary sinus wall fracture. -Pain management  Anemia of critical illness and chronic disease. Thrombocytopenia. - f/u CBC after PRBC transfusion on 1/19 - transfuse for Hb < 7, PLT < 10K, or significant bleeding  Goals of care. - prognosis for meaningful  recovery seems grim - palliative care consulted - family will like to give until 1/21 on CRRT before making further decisions about goals of care  Best practice (evaluated daily)  Diet: Tube Feeds  DVT prophylaxis: SQH GI prophylaxis: Pepcid Mobility: BR Disposition: FULL Goals of care: full code Family: updated pt's family at bedside and by phone on 1/19  D/w Dr. Leonel Ramsay and Dr. Joelyn Oms  Labs    CMP Latest Ref Rng & Units 10/09/2020 10/08/2020 10/08/2020  Glucose 70 - 99 mg/dL 137(H) 190(H) 239(H)  BUN 8 - 23 mg/dL 38(H) 46(H) 48(H)  Creatinine 0.61 - 1.24 mg/dL 1.28(H) 1.47(H) 1.56(H)  Sodium 135 - 145 mmol/L 138 136 134(L)  Potassium 3.5 - 5.1 mmol/L 4.7 5.4(H) 6.5(HH)  Chloride 98 - 111 mmol/L 103 100 100  CO2 22 - 32 mmol/L 23 20(L) 18(L)  Calcium 8.9 - 10.3 mg/dL 6.8(L) 6.9(L) 6.9(L)  Total Protein 6.5 - 8.1 g/dL - - -  Total Bilirubin 0.3 - 1.2 mg/dL - - -  Alkaline Phos 38 - 126 U/L - - -  AST 15 - 41 U/L - - -  ALT 0 -  44 U/L - - -    CBC Latest Ref Rng & Units 10/09/2020 10/08/2020 10/07/2020  WBC 4.0 - 10.5 K/uL 32.4(H) 35.0(H) 17.7(H)  Hemoglobin 13.0 - 17.0 g/dL 6.3(LL) 7.7(L) 8.0(L)  Hematocrit 39.0 - 52.0 % 20.1(L) 25.5(L) 27.3(L)  Platelets 150 - 400 K/uL 61(L) 80(L) 96(L)    ABG    Component Value Date/Time   PHART 7.298 (L) 10/08/2020 1218   PCO2ART 49.4 (H) 10/05/2020 1218   PO2ART 85 10/06/2020 1218   HCO3 18.1 (L) 10/02/2020 1614   TCO2 19 (L) 10/02/2020 1614   ACIDBASEDEF 7.0 (H) 10/02/2020 1614   O2SAT 54.0 10/02/2020 1614    CBG (last 3)  Recent Labs    10/09/20 0655 10/09/20 0755 10/09/20 0850  GLUCAP 107* 81 97    Critical care time: 34 minutes  Chesley Mires, MD [place] Pager - [phone] 10/09/2020, 9:56 AM

## 2020-10-09 NOTE — Plan of Care (Signed)
  Problem: Education: Goal: Knowledge of General Education information will improve Description: Including pain rating scale, medication(s)/side effects and non-pharmacologic comfort measures Outcome: Not Progressing   Problem: Health Behavior/Discharge Planning: Goal: Ability to manage health-related needs will improve Outcome: Not Progressing   Problem: Clinical Measurements: Goal: Ability to maintain clinical measurements within normal limits will improve Outcome: Not Progressing Goal: Will remain free from infection Outcome: Not Progressing Goal: Diagnostic test results will improve Outcome: Not Progressing Goal: Respiratory complications will improve Outcome: Not Progressing Goal: Cardiovascular complication will be avoided Outcome: Not Progressing   Problem: Activity: Goal: Risk for activity intolerance will decrease Outcome: Not Progressing   Problem: Nutrition: Goal: Adequate nutrition will be maintained Outcome: Not Progressing   Problem: Coping: Goal: Level of anxiety will decrease Outcome: Not Progressing   Problem: Elimination: Goal: Will not experience complications related to bowel motility Outcome: Not Progressing Goal: Will not experience complications related to urinary retention Outcome: Not Progressing   Problem: Pain Managment: Goal: General experience of comfort will improve Outcome: Not Progressing   Problem: Safety: Goal: Ability to remain free from injury will improve Outcome: Not Progressing   Problem: Skin Integrity: Goal: Risk for impaired skin integrity will decrease Outcome: Not Progressing   Problem: Education: Goal: Knowledge of disease or condition will improve Outcome: Not Progressing Goal: Knowledge of secondary prevention will improve Outcome: Not Progressing Goal: Knowledge of patient specific risk factors addressed and post discharge goals established will improve Outcome: Not Progressing   Problem: Coping: Goal: Will  identify appropriate support needs Outcome: Not Progressing   Problem: Health Behavior/Discharge Planning: Goal: Ability to manage health-related needs will improve Outcome: Not Progressing   Problem: Ischemic Stroke/TIA Tissue Perfusion: Goal: Complications of ischemic stroke/TIA will be minimized Outcome: Not Progressing   Problem: Activity: Goal: Ability to tolerate increased activity will improve Outcome: Not Progressing   Problem: Respiratory: Goal: Ability to maintain a clear airway and adequate ventilation will improve Outcome: Not Progressing   Problem: Role Relationship: Goal: Method of communication will improve Outcome: Not Progressing

## 2020-10-09 NOTE — Progress Notes (Signed)
eLink Physician-Brief Progress Note Patient Name: Taym Twist DOB: 09-02-1946 MRN: [id number]   Date of Service  10/09/2020  HPI/Events of Note  Stable CXR s/p chest tube placement, E-link RN reports desaturations earlier but saturation now 96-99%.  eICU Interventions  Continue to monitor closely, no intervention at this time.        Kerry Kass Kataleia Quaranta 10/09/2020, 3:02 AM

## 2020-10-09 NOTE — Progress Notes (Signed)
[facility] Progress Note   74M s/p PEA arrest, persistent AMS, CKD4 s/p ddKT 2005 Tac/MMF/Pred  Assessment:   1. PEA cardiac arrest, out of hospital. CPR 12 min. Etiology unclear.  2. Septic shock, recurrent on pressors 3. Likely new pneumonia, hypoxic RF 4. AKI on CKD4, anuric. CRRT start date 10/03/20, line placement via CCM 1/14 5. CKD4, h/o renal transplant x 2 ('83 & '05). Baseline Cr ~2-2.2. Tac on hold 2/2 AMS #8 6. Anion gap metabolic acidosis; resolved 7. Hyperkalemia, mild; resolved 8. Encephalopathy, hypoxic and toxic metabolic, neuro following 9. 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 DM2 11. Hypophosphatemia  Plan:  Cont CRRT at current settings  Unlikely to survive, would rec ongoing GOC conversations  Tentative to reassess goals of care on 1/21  Cont 4K bath  Cont to hold Tac  Subjective:    No sig improvement  CCM discussing GOC with family    Objective:   BP (!) 136/52   Pulse 77   Temp (!) 97.3 F (36.3 C) (Oral)   Resp (!) 24   Ht 5' 11" (1.803 m)   Wt 77.8 kg   SpO2 100%   BMI 23.92 kg/m   Intake/Output Summary (Last 24 hours) at 10/09/2020 1215 Last data filed at 10/09/2020 1200 Gross per 24 hour  Intake 3197.43 ml  Output 3612 ml  Net -414.57 ml   Weight change: -2.4 kg  Physical Exam: Gen:ill appearing CVS:reg rate Resp:intubated Abd:nd Ext: anasarca Neuro: sedated, unresponsive Access: rij temp line c/d/i  Imaging: DG Chest Port 1 View  Result Date: 10/09/2020 CLINICAL DATA:  Respiratory failure EXAM: PORTABLE CHEST 1 VIEW COMPARISON:  October 08, 2020 FINDINGS: Bilateral chest tubes are again noted. There are probable loculated pneumothoraces at the lung bases. The heart size remains unchanged. There is a persistent dense retrocardiac opacity. Extensive subcutaneous emphysema is again noted. The right-sided PICC line and dialysis catheter stable. The endotracheal tube terminates above the  carina. The enteric tube extends below the left hemidiaphragm. There is pneumomediastinum. IMPRESSION: 1. Interval placement of a left-sided chest tube with significant interval decrease in size of the left-sided pneumothorax. There are probable small residual loculated pneumothoraces at the lung bases. 2. Probable pneumomediastinum, better appreciated on this study but likely stable from prior. 3. Remaining lines and tubes are stable. 4. Persistent extensive subcutaneous emphysema. 5. Persistent airspace opacities at the lung bases which may represent consolidation or atelectasis. Electronically Signed   By: Christopher  Green M.D.   On: 10/09/2020 02:44   DG Chest Port 1 View  Result Date: 10/08/2020 CLINICAL DATA:  Chest tube placement EXAM: PORTABLE CHEST 1 VIEW COMPARISON:  10/08/2020 FINDINGS: Endotracheal tube with the tip 4.5 cm above the carina. Right jugular central venous sheath with the tip projecting over the SVC. Right subclavian central venous catheter with the tip projecting over the cavoatrial junction. Nasogastric tube with the tip coursing below the diaphragm. Bilateral chest tubes are present. Small right pneumothorax measuring less than 10%. Possible trace left basilar pneumothorax. Bilateral small pleural effusions. Bibasilar airspace disease likely reflecting atelectasis. Stable cardiomegaly. Multiple bilateral rib fractures. Extensive bilateral soft tissue emphysema along the right and left chest walls. IMPRESSION: 1. Bilateral chest tubes in place. Small right pneumothorax measuring less than 10%. Possible trace left basilar pneumothorax. 2. Bilateral small pleural effusions with bibasilar atelectasis. 3. Multiple bilateral rib fractures. 4. Extensive bilateral soft tissue emphysema. Electronically Signed   By: Hetal  Patel   On: 10/08/2020   16:58   DG Chest Port 1 View  Result Date: 10/08/2020 CLINICAL DATA:  Respiratory failure. EXAM: PORTABLE CHEST 1 VIEW COMPARISON:  Chest x-ray  from same day at 0435 hours. FINDINGS: Unchanged endotracheal and enteric tubes. Unchanged right internal jugular and right subclavian central venous catheters. The right pigtail chest tube appears slightly retracted in the interval. Enlarging now moderate left pneumothorax. No right pneumothorax. There may be some new pneumomediastinum as well. New partial collapse of the left lung with patchy consolidation in the lingula and left lower lobe. Unchanged consolidation at the right lung base. No pleural effusion. Stable cardiomediastinal silhouette. New marked subcutaneous emphysema throughout the chest and lower neck. IMPRESSION: 1. Enlarging now moderate left pneumothorax. New partial collapse of the left lung. 2. The right chest tube appears slightly retracted in the interval. No right pneumothorax. 3. New marked subcutaneous emphysema throughout the chest and lower neck. Suspected new small volume pneumomediastinum. 4. Unchanged lingular and bilateral lower lobe airspace disease. These results were called by telephone on 10/08/2020 at 4:36 pm to provider VINEET SOOD , who verbally acknowledged these results. Electronically Signed   By: William T Derry M.D.   On: 10/08/2020 16:43   DG Chest Port 1 View  Result Date: 10/08/2020 CLINICAL DATA:  Respiratory failure. EXAM: PORTABLE CHEST 1 VIEW COMPARISON:  10/07/2020.  CT 10/12/2020. FINDINGS: Endotracheal tube, NG tube, right IJ line, right subclavian line, right chest tube in stable position. Stable cardiomegaly. Persistent left mid lung and bibasilar infiltrates/edema. Persistent left-sided moderate pleural effusion. Small left apical pneumothorax again noted. No right pneumothorax noted. Right chest wall subcutaneous emphysema again noted. Bilateral rib fractures again noted. IMPRESSION: 1. Lines and tubes including right chest tube in stable position. Small left apical pneumothorax again noted. No right pneumothorax noted. Right chest wall subcutaneous  emphysema again noted. 2. Persistent left mid lung and bibasilar infiltrates/edema. Persistent left-sided moderate pleural effusion. Similar findings noted on prior exam. Electronically Signed   By: Thomas  Register   On: 10/08/2020 05:30    Labs: BMET Recent Labs  Lab 10/06/20 0728 10/06/20 1618 10/07/20 0356 10/07/20 1520 10/08/20 0307 10/08/20 1600 10/08/20 2053 10/09/20 0405  NA  --  138 136 134* 137 134* 136 138  K 4.3 3.8 3.9 4.7 4.2 6.5* 5.4* 4.7  CL  --  102 100 99 103 100 100 103  CO2  --  23 24 22 22 18* 20* 23  GLUCOSE  --  317* 241* 239* 111* 239* 190* 137*  BUN  --  36* 41* 46* 41* 48* 46* 38*  CREATININE  --  1.36* 1.32* 1.50* 1.26* 1.56* 1.47* 1.28*  CALCIUM  --  7.3* 7.5* 7.0* 7.2* 6.9* 6.9* 6.8*  PHOS 1.6* 1.5* 1.6* 3.4 2.7 7.1*  --  3.5   CBC Recent Labs  Lab 10/04/20 1619 10/05/20 0311 10/06/20 0312 10/07/20 0356 10/08/20 0205 10/09/20 0405  WBC 10.6*   < > 14.3* 17.7* 35.0* 32.4*  NEUTROABS 9.6*  --   --   --   --   --   HGB 7.1*   < > 7.1* 8.0* 7.7* 6.3*  HCT 24.1*   < > 24.4* 27.3* 25.5* 20.1*  MCV 97.2   < > 96.1 96.1 97.7 94.4  PLT 103*   < > 74* 96* 80* 61*   < > = values in this interval not displayed.    Medications:    . sodium chloride   Intravenous Once  . aspirin  81 mg   Per Tube Daily  . atorvastatin  10 mg Per Tube Daily  . chlorhexidine gluconate (MEDLINE KIT)  15 mL Mouth Rinse BID  . Chlorhexidine Gluconate Cloth  6 each Topical Daily  . famotidine  20 mg Per Tube Daily  . feeding supplement (PROSource TF)  45 mL Per Tube QID  . insulin aspart  0-20 Units Subcutaneous Q4H  . latanoprost  1 drop Both Eyes QHS  . mouth rinse  15 mL Mouth Rinse 10 times per day  . mycophenolate  500 mg Per Tube BID  . predniSONE  5 mg Per Tube q AM  . sodium chloride flush  10 mL Intracatheter Q8H  . sodium chloride flush  10-40 mL Intracatheter Q12H    B , MD  10/09/2020, 12:15 PM   

## 2020-10-09 NOTE — Consult Note (Signed)
Palliative Medicine Inpatient Consult Note  Reason for consult:  Goals of Care  HPI:  Per intake H&P --> 75 year old man s/p 2 renal transplants , ckd stage 4, CHF with reduced EF, HTN admitted after fall, trauma, PEA arrest s/p 12 mins ACLS.   Has been hospitalized for the past seven days with minimal improvements. Overall outlook appears quite poor in the setting of multiple comorbidities and recent PEA arrest likely causing significant hypoxic brain injury per neurology note revies. Nephrology is also involved and recommend continuing CRRT. Yesterday intensivist met with patients wife and daughter - decision was made for no CPR or shocks if cardiac arrest should occur again.   Palliative care has been asked to get involved to further address goals of care. It appears that patients trajectory is quite poor.  Clinical Assessment/Goals of Care: I have reviewed medical records including EPIC notes, labs and imaging, received report from bedside RN, assessed the patient.    I called [facility] to further discuss diagnosis prognosis, GOC, EOL wishes, disposition and options.  Patient's wife was sleeping at the time of this phone call therefore she was not directly involved though her daughters were able to share with me the prior conversations that were had between them as well as the healthcare team's.   I introduced Palliative Medicine as specialized medical care for people living with serious illness. It focuses on providing relief from the symptoms and stress of a serious illness. The goal is to improve quality of life for both the patient and the family.  Maudie Mercury shares that her father is from [place], [place].  He has been married to their mother [name] for the last 29 years.  Grahm has two daughters Joelene Millin and Waldo both of whom live in [place], [place].  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  Keiton is a man who is described as "always on the go".  He is identified as a strong man who was non-stopable.  From a religious perspective Tobe is a church going god loving man.  Prior to [place] PEA arrest he was fully independent of activities of daily living.  He did not use additional assistive aids to get around though he was no longer driving.  A detailed discussion was had today regarding advanced directives there are none on file though patient's wife, [name] has been making decisions for him throughout his hospital course in collaboration with her 2 daughters.    Concepts specific to code status, artifical feeding and hydration, continued IV antibiotics and rehospitalization was had.    The decision was made between the intensivist team yesterday and the patient's family to transition CODE STATUS to partial code in the setting of Elai's overall poor prognosis.  The patient's family has elected to not pursue cardiopulmonary resuscitation or shocks though they would like patient to remain intubated and receive ACLS medications.  Patient's family expresses their biggest fear is that Geran passes away alone without anyone present to support them.  The difference between a aggressive medical intervention path  and a palliative comfort care path for this patient at this time was had.  Maudie Mercury and Andria Frames vocalize that they understand Kendel is doing quite poorly and his likelihood for survival is low.  They expressed that this is exceptionally waning on their mother, [name] who is struggling to come to  grips with the reality of the situation.  The intensivist team plans for a follow-up meeting tomorrow which I shared we would also be present in to aid in additional conversations and support in terms of the direction of care.  I encouraged patient's family to consider having their pastor come in to pray with [place] which they were very much open to.  In  the meanwhile I have asked our chaplain, Dorian Pod to provide prayer.  Discussed the importance of continued conversation with family and their  medical providers regarding overall plan of care and treatment options, ensuring decisions are within the context of the patients values and GOCs.  Decision Maker: Maurion Walkowiak (spouse) [phone]  SUMMARY OF RECOMMENDATIONS   Partial Code - Intubation and ACLS medications are okay though no other interventions  Spiritual support  Patient's family encouraged to call home pastor to come in and provide additional prayer  Plan for an additional goals of care meeting with the intensivist team tomorrow though the time is unknown given the present weather conditions, the family is unable to commit to a specific time  Code Status/Advance Care Planning: Modified CODE - Intubate and give ACLS medications   Palliative Prophylaxis:   Oral Care, ROM  Additional Recommendations (Limitations, Scope, Preferences):  Continue current course of treatment   Psycho-social/Spiritual:   Desire for further Chaplaincy support: Yes  Additional Recommendations: Education on PEA arrest and anoxic brain injury outcomes   Prognosis: Exceptionally poor  Discharge Planning: Discharge may very likely be [place]  Vitals:   10/09/20 0500 10/09/20 0600  BP: (!) 125/54 (!) 116/51  Pulse: 76 74  Resp: (!) 30 (!) 24  Temp:    SpO2: 100% 100%    Intake/Output Summary (Last 24 hours) at 10/09/2020 3643 Last data filed at 10/09/2020 8377 Gross per 24 hour  Intake 2809.93 ml  Output 3787 ml  Net -977.07 ml   Last Weight  Most recent update: 10/09/2020  5:14 AM   Weight  77.8 kg (171 lb 8.3 oz)           Gen:  Older M with multiple bruises HEENT: Dry mucous membranes CV: Regular rate and rhythm  PULM: On ventilator support ABD: soft/nontender  EXT: Dusky toes bilaterally Neuro: Somnolent non responsive  PPS: 10%   This conversation/these  recommendations were discussed with patient primary care team, Dr. Halford Chessman  Time In: 0820 Time Out: 0930 Total Time: 70 Greater than 50%  of this time was spent counseling and coordinating care related to the above assessment and plan.  [facility] Team Team Cell Phone: [phone] Please utilize secure chat with additional questions, if there is no response within 30 minutes please call the above phone number  Palliative Medicine Team providers are available by phone from 7am to 7pm daily and can be reached through the team cell phone.  Should this patient require assistance outside of these hours, please call the patient's attending physician.

## 2020-10-09 NOTE — Progress Notes (Signed)
[facility] Progress Note Patient Name: [name] DOB: 10-17-1945 MRN: [id number]   Date of Service  10/09/2020  HPI/Events of Note  Hypoglycemia.  eICU Interventions  D 10 % infusion started at 50 ml / dl, CBG hourly x 6.        [name] 10/09/2020, 4:19 AM

## 2020-10-09 NOTE — Progress Notes (Signed)
[facility] Progress Note Patient Name: Luiz Trumpower DOB: Apr 27, 1946 MRN: [id number]   Date of Service  10/09/2020  HPI/Events of Note  Hemoglobin 6.3 gm / dl. No overt source of hemorrhage.  eICU Interventions  Informed consent obtained from patient's spouse Mrs. Nigel Berthold to transfuse 1 unit of PRBC.        Kerry Kass Nala Kachel 10/09/2020, 5:14 AM

## 2020-10-09 NOTE — Progress Notes (Signed)
Subjective: No significant change today.   Exam: Vitals:   10/09/20 1030 10/09/20 1045  BP: (!) 124/50 (!) 127/49  Pulse: 72 74  Resp: (!) 24 (!) 24  Temp: (!) 97.3 F (36.3 C)   SpO2: 97% 100%   Gen: In bed, intubated Eyes: Left eye is bloodshot Resp: Ventilated Abd: soft, nt  Neuro: MS: Eyes are open, he attempts to turn his head towards voice, but does not follow commands.  CN: Pupils reactive bilaterally, corneals intact  Motor: He withdraws all extremities to noxious stimulation, but does not clearly cross midline or clavicle. Sensory: As above  Pertinent Labs: Cr 1.28  Impression: 75 yo M with persistent encephalopathy in the setting of cardiac arrest with suggestion of hypoxic injury on MRI in the posterior regiosn, multiple small infarcts. My suspicion is for a multifactorial process, though given the degree of his encephalopathy, I am concerned for significant hypoxic injury in addition to his strokes.  He has not demonstrated much improvement over the course of the past few days, and has developed more medical complications which has prompted the critical care service to involve palliative care.  Appreciate their involvement.   Recommendations: 1) neurology will continue to follow.  2) continue ASA for stroke prevention  3) already on low dose statin at home, no need for increase as unlikley to be atherogenic 4) continue DM management 5) will need therapy if he survives.  6) Agree with palliative care involvement.  Roland Rack, MD [facility] [phone]  If 7pm- 7am, please page neurology on call as listed in [place].

## 2020-10-09 NOTE — Progress Notes (Signed)
Notified provider of increase in WBC from 32.4 @ 0400 to 42.3 @ 1200. Patient already receiving IV antibiotics. No new orders at this time.

## 2020-10-10 ENCOUNTER — Inpatient Hospital Stay (HOSPITAL_COMMUNITY): Payer: Medicare Other

## 2020-10-10 DIAGNOSIS — Z9689 Presence of other specified functional implants: Secondary | ICD-10-CM | POA: Diagnosis not present

## 2020-10-10 DIAGNOSIS — S270XXA Traumatic pneumothorax, initial encounter: Secondary | ICD-10-CM | POA: Diagnosis not present

## 2020-10-10 DIAGNOSIS — I469 Cardiac arrest, cause unspecified: Secondary | ICD-10-CM | POA: Diagnosis not present

## 2020-10-10 DIAGNOSIS — G931 Anoxic brain damage, not elsewhere classified: Secondary | ICD-10-CM | POA: Diagnosis not present

## 2020-10-10 DIAGNOSIS — J9601 Acute respiratory failure with hypoxia: Secondary | ICD-10-CM | POA: Diagnosis not present

## 2020-10-10 LAB — CBC
HCT: 24.7 % — ABNORMAL LOW (ref 39.0–52.0)
Hemoglobin: 7.9 g/dL — ABNORMAL LOW (ref 13.0–17.0)
MCH: 29.2 pg (ref 26.0–34.0)
MCHC: 32 g/dL (ref 30.0–36.0)
MCV: 91.1 fL (ref 80.0–100.0)
Platelets: 47 10*3/uL — ABNORMAL LOW (ref 150–400)
RBC: 2.71 MIL/uL — ABNORMAL LOW (ref 4.22–5.81)
RDW: 19 % — ABNORMAL HIGH (ref 11.5–15.5)
WBC: 31.9 10*3/uL — ABNORMAL HIGH (ref 4.0–10.5)
nRBC: 0.3 % — ABNORMAL HIGH (ref 0.0–0.2)

## 2020-10-10 LAB — RENAL FUNCTION PANEL
Albumin: 2 g/dL — ABNORMAL LOW (ref 3.5–5.0)
Albumin: 1.8 g/dL — ABNORMAL LOW (ref 3.5–5.0)
Anion gap: 12 (ref 5–15)
Anion gap: 11 (ref 5–15)
BUN: 28 mg/dL — ABNORMAL HIGH (ref 8–23)
BUN: 24 mg/dL — ABNORMAL HIGH (ref 8–23)
CO2: 22 mmol/L (ref 22–32)
CO2: 21 mmol/L — ABNORMAL LOW (ref 22–32)
Calcium: 7.1 mg/dL — ABNORMAL LOW (ref 8.9–10.3)
Calcium: 7 mg/dL — ABNORMAL LOW (ref 8.9–10.3)
Chloride: 101 mmol/L (ref 98–111)
Chloride: 101 mmol/L (ref 98–111)
Creatinine, Ser: 1.18 mg/dL (ref 0.61–1.24)
Creatinine, Ser: 1.08 mg/dL (ref 0.61–1.24)
GFR, Estimated: >60 mL/min (ref >60)
GFR, Estimated: >60 mL/min (ref >60)
Glucose, Bld: 168 mg/dL — ABNORMAL HIGH (ref 70–99)
Glucose, Bld: 215 mg/dL — ABNORMAL HIGH (ref 70–99)
Phosphorus: 2.9 mg/dL (ref 2.5–4.6)
Phosphorus: 3.1 mg/dL (ref 2.5–4.6)
Potassium: 4.5 mmol/L (ref 3.5–5.1)
Potassium: 4.9 mmol/L (ref 3.5–5.1)
Sodium: 135 mmol/L (ref 135–145)
Sodium: 133 mmol/L — ABNORMAL LOW (ref 135–145)

## 2020-10-10 LAB — BLOOD CULTURE ID PANEL (REFLEXED) - BCID2

## 2020-10-10 LAB — CULTURE, RESPIRATORY W GRAM STAIN: Special Requests: NORMAL

## 2020-10-10 LAB — GLUCOSE, CAPILLARY
Glucose-Capillary: 128 mg/dL — ABNORMAL HIGH (ref 70–99)
Glucose-Capillary: 129 mg/dL — ABNORMAL HIGH (ref 70–99)
Glucose-Capillary: 145 mg/dL — ABNORMAL HIGH (ref 70–99)
Glucose-Capillary: 146 mg/dL — ABNORMAL HIGH (ref 70–99)
Glucose-Capillary: 151 mg/dL — ABNORMAL HIGH (ref 70–99)
Glucose-Capillary: 165 mg/dL — ABNORMAL HIGH (ref 70–99)

## 2020-10-10 MED ORDER — ALBUMIN HUMAN 5 % IV SOLN
INTRAVENOUS | Status: AC
  Filled 2020-10-10: qty 500

## 2020-10-10 MED ORDER — LACTATED RINGERS IV BOLUS
1000.0000 mL | Freq: Once | INTRAVENOUS | Status: AC
  Administered 2020-10-10: 1000 mL via INTRAVENOUS

## 2020-10-10 MED ORDER — ALBUMIN HUMAN 25 % IV SOLN
25.0000 g | Freq: Once | INTRAVENOUS | Status: AC
  Administered 2020-10-10: 25 g via INTRAVENOUS
  Filled 2020-10-10: qty 100

## 2020-10-10 MED ORDER — ALBUMIN HUMAN 5 % IV SOLN: 12.5000 g | Freq: Once | INTRAVENOUS | Status: DC

## 2020-10-10 NOTE — Progress Notes (Signed)
PHARMACY - PHYSICIAN COMMUNICATION CRITICAL VALUE ALERT - BLOOD CULTURE IDENTIFICATION (BCID)  [name] is an 75 y.o. male who presented to [facility] on 10/18/2020  Assessment:  72 yom admitted 1/12 s/p PEA arrest. Now with 1 of 4 BCx bottles growing staph epi with methicillin resistance detected.  Name of physician (or Provider) Contacted: Ogan, O  Current antibiotics: cefepime  Changes to prescribed antibiotics recommended:  No changes needed - likely contaminant  Results for orders placed or performed during the hospital encounter of 09/28/2020  Blood Culture ID Panel (Reflexed) (Collected: 10/08/2020  8:20 AM)  Result Value Ref Range   Enterococcus faecalis NOT DETECTED NOT DETECTED   Enterococcus Faecium NOT DETECTED NOT DETECTED   Listeria monocytogenes NOT DETECTED NOT DETECTED   Staphylococcus species DETECTED (A) NOT DETECTED   Staphylococcus aureus (BCID) NOT DETECTED NOT DETECTED   Staphylococcus epidermidis DETECTED (A) NOT DETECTED   Staphylococcus lugdunensis NOT DETECTED NOT DETECTED   Streptococcus species NOT DETECTED NOT DETECTED   Streptococcus agalactiae NOT DETECTED NOT DETECTED   Streptococcus pneumoniae NOT DETECTED NOT DETECTED   Streptococcus pyogenes NOT DETECTED NOT DETECTED   A.calcoaceticus-baumannii NOT DETECTED NOT DETECTED   Bacteroides fragilis NOT DETECTED NOT DETECTED   Enterobacterales NOT DETECTED NOT DETECTED   Enterobacter cloacae complex NOT DETECTED NOT DETECTED   Escherichia coli NOT DETECTED NOT DETECTED   Klebsiella aerogenes NOT DETECTED NOT DETECTED   Klebsiella oxytoca NOT DETECTED NOT DETECTED   Klebsiella pneumoniae NOT DETECTED NOT DETECTED   Proteus species NOT DETECTED NOT DETECTED   Salmonella species NOT DETECTED NOT DETECTED   Serratia marcescens NOT DETECTED NOT DETECTED   Haemophilus influenzae NOT DETECTED NOT DETECTED   Neisseria meningitidis NOT DETECTED NOT DETECTED   Pseudomonas aeruginosa NOT DETECTED NOT  DETECTED   Stenotrophomonas maltophilia NOT DETECTED NOT DETECTED   Candida albicans NOT DETECTED NOT DETECTED   Candida auris NOT DETECTED NOT DETECTED   Candida glabrata NOT DETECTED NOT DETECTED   Candida krusei NOT DETECTED NOT DETECTED   Candida parapsilosis NOT DETECTED NOT DETECTED   Candida tropicalis NOT DETECTED NOT DETECTED   Cryptococcus neoformans/gattii NOT DETECTED NOT DETECTED   Methicillin resistance mecA/C DETECTED (A) NOT DETECTED    Arturo Morton, PharmD, BCPS Please check AMION for all [facility] contact numbers Clinical Pharmacist 10/10/2020 9:17 PM

## 2020-10-10 NOTE — Progress Notes (Signed)
Elink assisted camera visit with family.

## 2020-10-10 NOTE — Progress Notes (Addendum)
[facility] Progress Note Patient Name: Kassidy Frankson DOB: May 06, 1946 MRN: [id number]   Date of Service  10/10/2020  HPI/Events of Note  Hypotension on dialysis requiring rapid titration of Norepinephrine infusion.  eICU Interventions  Albumin 25 %  100 ml iv bolus x 1        Allenmichael Mcpartlin U Lurae Hornbrook 10/10/2020, 11:40 PM

## 2020-10-10 NOTE — Progress Notes (Signed)
Subjective: No significant change today neurologically  Exam: Vitals:   10/10/20 0800 10/10/20 0830  BP: (!) 143/38 119/72  Pulse: 77 79  Resp: (!) 23 (!) 25  Temp: (!) 97 F (36.1 C)   SpO2: 100% 100%   Gen: In bed, intubated Eyes: Left eye is bloodshot Resp: Ventilated Abd: soft, nt  Neuro: MS: Eyes are open, he attempts to turn his head towards voice, but does not follow commands.  CN: Pupils reactive bilaterally, corneals intact  Motor: He withdraws all extremities to noxious stimulation, but does not clearly cross midline or clavicle.  He does not grab my hand when performing noxious stimulation even on the same side, but does appear to be withdrawal more than simple flexion. Sensory: As above  Pertinent Labs: Cr 1.18  Impression: 75 yo M with persistent encephalopathy in the setting of cardiac arrest with suggestion of hypoxic injury on MRI in the posterior regions, multiple small infarcts. My suspicion is for a multifactorial process, though given the degree of his encephalopathy, I am concerned for significant hypoxic injury in addition to his strokes.  He has not demonstrated much improvement over the course of the past few days, and has developed more medical complications which has prompted the critical care service to involve palliative care.  Appreciate their involvement.   Recommendations: 1) neurology will continue to follow.  2) continue ASA for stroke prevention  3) already on low dose statin at home, no need for increase as unlikley to be atherogenic 4) continue DM management 5) will need therapy if he survives.  6) Agree with palliative care involvement.  Roland Rack, MD [facility] [phone]  If 7pm- 7am, please page neurology on call as listed in [place].

## 2020-10-10 NOTE — Progress Notes (Addendum)
Nutrition Follow-up  DOCUMENTATION CODES:   Not applicable  INTERVENTION:   Pending results of family meeting today, recommend resume TF at trickle rate via OGT if ongoing supportive care planned: Vital AF 1.2 at 20 ml/h  As able, increase to goal rate of 75 ml/h (1800 ml per day) Prosource TF 45 ml QID  Provides 2320 kcal, 179 gm protein, 1460 ml free water daily  If plans are for comfort care, recommend d/c TF.  NUTRITION DIAGNOSIS:   Inadequate oral intake related to inability to eat as evidenced by NPO status.  Ongoing   GOAL:   Patient will meet greater than or equal to 90% of their needs  Unmet  MONITOR:   Vent status,TF tolerance,Skin,Labs  REASON FOR ASSESSMENT:   Ventilator,Consult Enteral/tube feeding initiation and management  ASSESSMENT:   75 yo male admitted after fall with PEA arrest S/P 12 minutes ACLS. PMH includes 2 renal transplants, CKD stage 4, CHF, HTN.   Remains on CRRT.  On vasopressin and levophed.  Chest tubes in place for R pneumothorax. Noted poor prognosis. Family meeting planned for today.   TF has been off since 1/19 at 11 am.  25 ml emesis documented 1/19. Receiving IVF of D10 at 50 ml/h due to hypoglycemia and TF intolerance.   Patient remains intubated on ventilator support MV: 14.2 L/min Temp (24hrs), Avg:95.8 F (35.4 C), Min:93.9 F (34.4 C), Max:97.3 F (36.3 C)   Labs reviewed.  CBG: 145-128  Medications reviewed and include prednisone, novolog.  Weight 76.5 kg today, down from 86.2 kg on admission I/O -2.176 L since admission Chest tube 60 ml output x 24 h, 680 ml out on the previous 24 h Stool 575 ml output x 24 h 1/19-1/20, no output documented in the past 24 h CRRT 3,543 ml output x 24 h   Diet Order:   Diet Order            Diet NPO time specified  Diet effective now                 EDUCATION NEEDS:   Not appropriate for education at this time  Skin:  Skin Assessment: Skin Integrity  Issues: Skin Integrity Issues:: Other (Comment) Other: partial thickness degloving injury to arms, abrasion/cut over L eye  Last BM:  1/21 type 7  Height:   Ht Readings from Last 1 Encounters:  10/18/2020 5\' 11"  (1.803 m)    Weight:   Wt Readings from Last 1 Encounters:  10/10/20 76.5 kg    Ideal Body Weight:  78.2 kg  BMI:  Body mass index is 23.52 kg/m.  Estimated Nutritional Needs:   Kcal:  2100-2300  Protein:  >/= 169 gm  Fluid:  2 L    Lucas Mallow, RD, LDN, CNSC Please refer to Amion for contact information.

## 2020-10-10 NOTE — Progress Notes (Signed)
[facility] Progress Note   28M s/p PEA arrest, persistent AMS, CKD4 s/p ddKT 2005 Tac/MMF/Pred  Assessment:   1. PEA cardiac arrest, out of hospital. CPR 12 min. Etiology unclear.  2. Septic shock, recurrent on pressors 3. Likely new pneumonia, hypoxic RF 4. AKI on CKD4, anuric. CRRT start date 10/03/20, line placement via CCM 1/14 5. CKD4, h/o renal transplant x 2 ('83 & '05). Baseline Cr ~2-2.2. Tac on hold 2/2 AMS #8 6. Anion gap metabolic acidosis; resolved 7. Hyperkalemia, mild; resolved 8. Encephalopathy, hypoxic and toxic metabolic, neuro following 9. 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 DM2 11. Hypophosphatemia  Plan:  Cont CRRT at current settings  Unlikely to survive, would rec ongoing [place] conversations.  Plan for family meeting today noted.  Cont 4K bath  Cont to hold Tac  Subjective:    Remains intubated and sedated.  Not much response.  Discussed with ICU nurse, tolerating CRRT okay.    Objective:   BP (!) 137/33   Pulse 76   Temp (!) 93.9 F (34.4 C) (Axillary)   Resp (!) 24   Ht 5' 11"  (1.803 m)   Wt 76.5 kg   SpO2 100%   BMI 23.52 kg/m   Intake/Output Summary (Last 24 hours) at 10/10/2020 0737 Last data filed at 10/10/2020 0700 Gross per 24 hour  Intake 2920.93 ml  Output 3603 ml  Net -682.07 ml   Weight change: -1.3 kg  Physical Exam: Gen:ill appearing CVS:reg rate Resp:intubated Abd:nd Ext: anasarca Neuro: sedated, unresponsive Access: rij temp line c/d/i  Imaging: DG Chest Port 1 View  Result Date: 10/09/2020 CLINICAL DATA:  Respiratory failure EXAM: PORTABLE CHEST 1 VIEW COMPARISON:  October 08, 2020 FINDINGS: Bilateral chest tubes are again noted. There are probable loculated pneumothoraces at the lung bases. The heart size remains unchanged. There is a persistent dense retrocardiac opacity. Extensive subcutaneous emphysema is again noted. The right-sided PICC line and dialysis catheter stable.  The endotracheal tube terminates above the carina. The enteric tube extends below the left hemidiaphragm. There is pneumomediastinum. IMPRESSION: 1. Interval placement of a left-sided chest tube with significant interval decrease in size of the left-sided pneumothorax. There are probable small residual loculated pneumothoraces at the lung bases. 2. Probable pneumomediastinum, better appreciated on this study but likely stable from prior. 3. Remaining lines and tubes are stable. 4. Persistent extensive subcutaneous emphysema. 5. Persistent airspace opacities at the lung bases which may represent consolidation or atelectasis. Electronically Signed   By: Constance Holster M.D.   On: 10/09/2020 02:44   DG Chest Port 1 View  Result Date: 10/08/2020 CLINICAL DATA:  Chest tube placement EXAM: PORTABLE CHEST 1 VIEW COMPARISON:  10/08/2020 FINDINGS: Endotracheal tube with the tip 4.5 cm above the carina. Right jugular central venous sheath with the tip projecting over the SVC. Right subclavian central venous catheter with the tip projecting over the cavoatrial junction. Nasogastric tube with the tip coursing below the diaphragm. Bilateral chest tubes are present. Small right pneumothorax measuring less than 10%. Possible trace left basilar pneumothorax. Bilateral small pleural effusions. Bibasilar airspace disease likely reflecting atelectasis. Stable cardiomegaly. Multiple bilateral rib fractures. Extensive bilateral soft tissue emphysema along the right and left chest walls. IMPRESSION: 1. Bilateral chest tubes in place. Small right pneumothorax measuring less than 10%. Possible trace left basilar pneumothorax. 2. Bilateral small pleural effusions with bibasilar atelectasis. 3. Multiple bilateral rib fractures. 4. Extensive bilateral soft tissue emphysema. Electronically Signed   By: Elbert Ewings  Patel   On: 10/08/2020 16:58   DG Chest Port 1 View  Result Date: 10/08/2020 CLINICAL DATA:  Respiratory failure. EXAM:  PORTABLE CHEST 1 VIEW COMPARISON:  Chest x-ray from same day at 0435 hours. FINDINGS: Unchanged endotracheal and enteric tubes. Unchanged right internal jugular and right subclavian central venous catheters. The right pigtail chest tube appears slightly retracted in the interval. Enlarging now moderate left pneumothorax. No right pneumothorax. There may be some new pneumomediastinum as well. New partial collapse of the left lung with patchy consolidation in the lingula and left lower lobe. Unchanged consolidation at the right lung base. No pleural effusion. Stable cardiomediastinal silhouette. New marked subcutaneous emphysema throughout the chest and lower neck. IMPRESSION: 1. Enlarging now moderate left pneumothorax. New partial collapse of the left lung. 2. The right chest tube appears slightly retracted in the interval. No right pneumothorax. 3. New marked subcutaneous emphysema throughout the chest and lower neck. Suspected new small volume pneumomediastinum. 4. Unchanged lingular and bilateral lower lobe airspace disease. These results were called by telephone on 10/08/2020 at 4:36 pm to provider Elisabeth Cara SOOD , who verbally acknowledged these results. Electronically Signed   By: Titus Dubin M.D.   On: 10/08/2020 16:43    Labs: BMET Recent Labs  Lab 10/07/20 0356 10/07/20 1520 10/08/20 0307 10/08/20 1600 10/08/20 2053 10/09/20 0405 10/09/20 1525 10/10/20 0423  NA 136 134* 137 134* 136 138 135 135  K 3.9 4.7 4.2 6.5* 5.4* 4.7 5.3* 4.5  CL 100 99 103 100 100 103 101 101  CO2 24 22 22  18* 20* 23 21* 22  GLUCOSE 241* 239* 111* 239* 190* 137* 196* 168*  BUN 41* 46* 41* 48* 46* 38* 30* 28*  CREATININE 1.32* 1.50* 1.26* 1.56* 1.47* 1.28* 1.25* 1.18  CALCIUM 7.5* 7.0* 7.2* 6.9* 6.9* 6.8* 7.0* 7.1*  PHOS 1.6* 3.4 2.7 7.1*  --  3.5 3.6 2.9   CBC Recent Labs  Lab 10/04/20 1619 10/05/20 0311 10/08/20 0205 10/09/20 0405 10/09/20 1139 10/10/20 0423  WBC 10.6*   < > 35.0* 32.4* 42.3* 31.9*   NEUTROABS 9.6*  --   --   --   --   --   HGB 7.1*   < > 7.7* 6.3* 8.1* 7.9*  HCT 24.1*   < > 25.5* 20.1* 24.8* 24.7*  MCV 97.2   < > 97.7 94.4 89.9 91.1  PLT 103*   < > 80* 61* 60* 47*   < > = values in this interval not displayed.    Medications:    . sodium chloride   Intravenous Once  . aspirin  81 mg Per Tube Daily  . atorvastatin  10 mg Per Tube Daily  . chlorhexidine gluconate (MEDLINE KIT)  15 mL Mouth Rinse BID  . Chlorhexidine Gluconate Cloth  6 each Topical Daily  . famotidine  20 mg Per Tube Daily  . feeding supplement (PROSource TF)  45 mL Per Tube QID  . insulin aspart  0-20 Units Subcutaneous Q4H  . latanoprost  1 drop Both Eyes QHS  . mouth rinse  15 mL Mouth Rinse 10 times per day  . mycophenolate  500 mg Per Tube BID  . predniSONE  5 mg Per Tube q AM  . sodium chloride flush  10 mL Intracatheter Q8H  . sodium chloride flush  10-40 mL Intracatheter Q12H   Timiya Howells Tanna Furry, MD  10/10/2020, 7:37 AM

## 2020-10-10 NOTE — Progress Notes (Addendum)
   Palliative Medicine Inpatient Follow Up Note  Family meeting held this afternoon. Per Dr. Loanne Drilling patients family requested that the Palliative care team not be involved.   We will sign off for the time being but please do not hesitate to call if we can be of any further assistance with this difficult clinical case.  No charge ______________________________________________________________________________________ [place] Team Team Cell Phone: [phone] Please utilize secure chat with additional questions, if there is no response within 30 minutes please call the above phone number  Palliative Medicine Team providers are available by phone from 7am to 7pm daily and can be reached through the team cell phone.  Should this patient require assistance outside of these hours, please call the patient's attending physician.

## 2020-10-10 NOTE — Progress Notes (Signed)
NAME:  [name], MRN:  [id number], DOB:  July 24, 1946, LOS: 9 ADMISSION DATE:  09/23/2020, CONSULTATION DATE:  1/12 REFERRING MD:  Dr. Rogene Houston, CHIEF COMPLAINT:  Cardiac arrest   Brief History:  75 year old male with renal transplant history admitted 1/12 after PEA arrest 12 minutes.  History of Present Illness:  75 year old male with PMH as below, which is significant for CKD (previously on HD, but is s/p renal transplant in 2005 at [facility]), AF not on [facility], who presented to [name] ED after cardiac arrest athome 1/12. Apparently 1/11 in the evening hours he developed chest pain and dyspnea, but proceeded to go to bed. Then 1/12 AM his wife awoke around 0930 and found him to be unresponsive and foaming at the mouth. He was still breathing. As she called EMS he fell to the floor. Upon EMS arrival the patient was pulseless and ACLS was initiated. CPR was performed for approximately 12 minutes with two doses of epinephrine. He suffered injury to his L periorbit at some point during all this, raising concern for head injury and he was treated as a level one trauma in ED. Upon arrival he was unresponsive and was immediately intubated. He was emergently taken for pan-CT scan. No operable injury was identified. PCCM asked to admit.   Past Medical History:   Paroxysmal AF (no AC)  ESRD S/p Renal transplant x2  HFrEF  HTN  HLD  T2DM  PAD  Significant Hospital Events:  1/12 admit for PEA arrest 1/13 Pneumothorax development with pigtail catheter placed. Patient had increasing pressor requirements.  1/14 start CRRT 1/18 change chest tube to water seal 1/19 new Lt pneumothorax; changed to no CPR/defibrillation 1/20 transfuse PRBC  Consults:  Neurosurgery (S/o) Trauma surgery (S/o) Nephrology Neurology  Palliative care  Procedures:  CVL R subclavian 1/12 > ETT 1/12 > Rt pigtail catheter 1/14 >  R IJ HD cath 1/14 >>  Lt pigtail catheter 1/19 >>   Significant Diagnostic Tests:   1/12 CTA head > no large vessel occlusion. Mod-severe stenosis of the dominant R vert.  1/12 CT Maxillofacial > Subtle fracture of the posterior wall of the right maxillary sinus is new since September and likely acute. Trace fluid within the sinus. Hematoma/contusion involving the right parotid gland and regional subcutaneous tissues. Left forehead and periorbital hematoma without underlying fracture. 1/12 CT chest/abd/pelv > Numerous acute rib fractures: Right ribs 1 through 7 (with rib 2 through 4 flail segment) and left ribs 2 through 7. Mild intercostal hematoma, but no pneumothorax, hemothorax or pulmonary contusion. Low-density layering pleural effusions appear mildly increased from last month. Chronic atelectasis. MRI Brain 1/14 >> scattered small acute to early subacute cerebral and cerebellar infarcts, few scattered chronic cerebral microhemorrhages bilaterally, small amount of petechial blood associated with small left occipital infarct, possible more global hypoxic ischemic injury given subtle diffusion-weighted signal hyperintensity involving cortex posteriorly  Micro Data:  1/12 COVID/Flu >> negative 1/12 Urine Cultures > Negative 1/13 Blood Cultures > negative 1/19 Sputum >> Pseudomonas pansensitive 1/19 Blood >>  1/19 urine >>   Antimicrobials:  Ceftriaxone 1/13 >> 1/14; 1/16 >> 1/18  Cefepime 1/14 >> 1/16 Cefepime 1/18 >>   Interim History / Subjective:  Sedated, remains on mechanical ventilation, on CRRT  Objective   Blood pressure 119/72, pulse 79, temperature (!) 97 F (36.1 C), temperature source Oral, resp. rate (!) 25, height 5\' 11"  (1.803 m), weight 76.5 kg, SpO2 100 %.    Vent Mode: PRVC  FiO2 (%):  [80 %] 80 % Set Rate:  [24 bmp] 24 bmp Vt Set:  [600 mL] 600 mL PEEP:  [5 cmH20] 5 cmH20 Plateau Pressure:  [25 cmH20-31 cmH20] 25 cmH20   Intake/Output Summary (Last 24 hours) at 10/10/2020 0926 Last data filed at 10/10/2020 0800 Gross per 24 hour  Intake  2720.24 ml  Output 3300 ml  Net -579.76 ml   Filed Weights   10/08/20 0500 10/09/20 0500 10/10/20 0357  Weight: 80.2 kg 77.8 kg 76.5 kg   Physical Exam: General: Chronically ill-appearing HENT: [place], AT, OP clear, MMM Eyes: EOMI, no scleral icterus, left periorbital ecchymosis  Respiratory: Coarse breath sounds bilaterally.  No crackles, wheezing or rales. Bilateral chest tubes, no air leak Cardiovascular: RRR, -M/R/G, no JVD GI: BS+, soft, nontender Extremities:-Edema,-tenderness Neuro: Sedated however overbreathing the vent, does not follow commands, pupils reactive, withdraws to noxious stimuli GU: Foley in place  Resolved Hospital Problem list     Assessment & Plan:   Acute anoxic encephalopathy 2nd to PEA cardiac arrest, 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 -Discussed with neurology. Appreciate their assistance.  Minimal improvement in neuro status -Continue ASA -RASS goal 0 to -1  Acute hypoxic respiratory failure with compromised airway 2nd to PEA arrest, acute pulmonary edema and aspiration pneumonia. Rt pneumothorax after CPR. Spontaneous Lt pneumothorax on 1/19. Pseudomonas pneumonia -Day 9 of ABx; changed back to cefepime on 1/18 in setting of worsening hypotension -Continue cefepime and holding on de-escalation due to worsening septic shock -Continue bilateral chest tubes to suction -20 cm H2O.  No air leak and <100cc chest tube output.  No plan to remove until patient can be extubated -Wean vent settings.  Not a candidate for SBT -VAP  Septic shock secondary to Pseudomonas pneumonia -Maintain MAP > 65 -Wean Levophed and vasopressin -IV fluids as needed -Continue antibiotics as above  AKI in setting of shock from PEA arrest. CKD s/p renal transplant. -Nephrology following.  Currently on CRRT -Continue cellcept, prednisone -Plan for goals of care discussion today  PEA arrest PAF, chronic combined CHF. -Telemetry  DM type 2 poorly  controlled with hyperglycemia. Having episodes of hypoglycemia on 1/20. -SSI -D10 as he hasn't been able to tolerate tube feeds  R, L rib fx: flail segments by CT on the right. R maxillary sinus wall fracture. -Pain management  Anemia of critical illness and chronic disease. Thrombocytopenia. -S/p PRBC 1/19 -Transfuse for Hb < 7, PLT < 10K, or significant bleeding  Goals of care. -Prognosis for meaningful recovery seems grim -Palliative care following -We have given trial of CRRT and minimal neurologic recovery has been seen.  We will discuss goals of care today  Best practice (evaluated daily)  Diet: Tube Feeds  DVT prophylaxis: SQH GI prophylaxis: Pepcid Mobility: BR Disposition: FULL Goals of care: full code Family: Family meeting today D/w Dr. Leonel Ramsay (Neuro) and Palliative Care Team  Labs    CMP Latest Ref Rng & Units 10/10/2020 10/09/2020 10/09/2020  Glucose 70 - 99 mg/dL 168(H) 196(H) 137(H)  BUN 8 - 23 mg/dL 28(H) 30(H) 38(H)  Creatinine 0.61 - 1.24 mg/dL 1.18 1.25(H) 1.28(H)  Sodium 135 - 145 mmol/L 135 135 138  Potassium 3.5 - 5.1 mmol/L 4.5 5.3(H) 4.7  Chloride 98 - 111 mmol/L 101 101 103  CO2 22 - 32 mmol/L 22 21(L) 23  Calcium 8.9 - 10.3 mg/dL 7.1(L) 7.0(L) 6.8(L)  Total Protein 6.5 - 8.1 g/dL - - -  Total Bilirubin 0.3 - 1.2 mg/dL - - -  Alkaline Phos 38 - 126 U/L - - -  AST 15 - 41 U/L - - -  ALT 0 - 44 U/L - - -    CBC Latest Ref Rng & Units 10/10/2020 10/09/2020 10/09/2020  WBC 4.0 - 10.5 K/uL 31.9(H) 42.3(H) 32.4(H)  Hemoglobin 13.0 - 17.0 g/dL 7.9(L) 8.1(L) 6.3(LL)  Hematocrit 39.0 - 52.0 % 24.7(L) 24.8(L) 20.1(L)  Platelets 150 - 400 K/uL 47(L) 60(L) 61(L)    ABG    Component Value Date/Time   PHART 7.298 (L) 10/16/2020 1218   PCO2ART 49.4 (H) 09/25/2020 1218   PO2ART 85 09/27/2020 1218   HCO3 18.1 (L) 10/02/2020 1614   TCO2 19 (L) 10/02/2020 1614   ACIDBASEDEF 7.0 (H) 10/02/2020 1614   O2SAT 54.0 10/02/2020 1614    CBG (last 3)   Recent Labs    10/09/20 2340 10/10/20 0347 10/10/20 0812  GLUCAP 153* 145* 128*    Critical care time: 40 minutes   The patient is critically ill with multiple organ systems failure and requires high complexity decision making for assessment and support, frequent evaluation and titration of therapies, application of advanced monitoring technologies and extensive interpretation of multiple databases.   Rodman Pickle, M.D. [facility] Pulmonary/Critical Care Medicine 10/10/2020 9:26 AM   Please see Amion for pager number to reach on-call Pulmonary and Critical Care Team.

## 2020-10-11 ENCOUNTER — Inpatient Hospital Stay (HOSPITAL_COMMUNITY): Payer: Medicare Other

## 2020-10-11 DIAGNOSIS — J9601 Acute respiratory failure with hypoxia: Secondary | ICD-10-CM | POA: Diagnosis not present

## 2020-10-11 DIAGNOSIS — Z9689 Presence of other specified functional implants: Secondary | ICD-10-CM | POA: Diagnosis not present

## 2020-10-11 DIAGNOSIS — S270XXA Traumatic pneumothorax, initial encounter: Secondary | ICD-10-CM | POA: Diagnosis not present

## 2020-10-11 DIAGNOSIS — I469 Cardiac arrest, cause unspecified: Secondary | ICD-10-CM | POA: Diagnosis not present

## 2020-10-11 DIAGNOSIS — G931 Anoxic brain damage, not elsewhere classified: Secondary | ICD-10-CM | POA: Diagnosis not present

## 2020-10-11 LAB — GLUCOSE, CAPILLARY
Glucose-Capillary: 119 mg/dL — ABNORMAL HIGH (ref 70–99)
Glucose-Capillary: 97 mg/dL (ref 70–99)
Glucose-Capillary: 118 mg/dL — ABNORMAL HIGH (ref 70–99)
Glucose-Capillary: 182 mg/dL — ABNORMAL HIGH (ref 70–99)
Glucose-Capillary: 159 mg/dL — ABNORMAL HIGH (ref 70–99)
Glucose-Capillary: 142 mg/dL — ABNORMAL HIGH (ref 70–99)

## 2020-10-11 LAB — RENAL FUNCTION PANEL
Albumin: 2.1 g/dL — ABNORMAL LOW (ref 3.5–5.0)
Albumin: 1.9 g/dL — ABNORMAL LOW (ref 3.5–5.0)
Anion gap: 11 (ref 5–15)
Anion gap: 12 (ref 5–15)
BUN: 21 mg/dL (ref 8–23)
BUN: 21 mg/dL (ref 8–23)
CO2: 22 mmol/L (ref 22–32)
CO2: 21 mmol/L — ABNORMAL LOW (ref 22–32)
Calcium: 7 mg/dL — ABNORMAL LOW (ref 8.9–10.3)
Calcium: 6.9 mg/dL — ABNORMAL LOW (ref 8.9–10.3)
Chloride: 103 mmol/L (ref 98–111)
Chloride: 102 mmol/L (ref 98–111)
Creatinine, Ser: 1.09 mg/dL (ref 0.61–1.24)
Creatinine, Ser: 1.09 mg/dL (ref 0.61–1.24)
GFR, Estimated: >60 mL/min (ref >60)
GFR, Estimated: >60 mL/min (ref >60)
Glucose, Bld: 129 mg/dL — ABNORMAL HIGH (ref 70–99)
Glucose, Bld: 186 mg/dL — ABNORMAL HIGH (ref 70–99)
Phosphorus: 2.8 mg/dL (ref 2.5–4.6)
Phosphorus: 2.9 mg/dL (ref 2.5–4.6)
Potassium: 4.3 mmol/L (ref 3.5–5.1)
Potassium: 4.9 mmol/L (ref 3.5–5.1)
Sodium: 136 mmol/L (ref 135–145)
Sodium: 135 mmol/L (ref 135–145)

## 2020-10-11 LAB — CBC
HCT: 24.4 % — ABNORMAL LOW (ref 39.0–52.0)
Hemoglobin: 7.2 g/dL — ABNORMAL LOW (ref 13.0–17.0)
MCH: 27.9 pg (ref 26.0–34.0)
MCHC: 29.5 g/dL — ABNORMAL LOW (ref 30.0–36.0)
MCV: 94.6 fL (ref 80.0–100.0)
Platelets: 34 10*3/uL — ABNORMAL LOW (ref 150–400)
RBC: 2.58 MIL/uL — ABNORMAL LOW (ref 4.22–5.81)
RDW: 18.8 % — ABNORMAL HIGH (ref 11.5–15.5)
WBC: 28.1 10*3/uL — ABNORMAL HIGH (ref 4.0–10.5)
nRBC: 0.2 % (ref 0.0–0.2)

## 2020-10-11 MED ORDER — LACTATED RINGERS IV BOLUS: 1000.0000 mL | Freq: Once | INTRAVENOUS | Status: DC

## 2020-10-11 MED ORDER — LACTATED RINGERS IV BOLUS
500.0000 mL | Freq: Once | INTRAVENOUS | Status: AC
  Administered 2020-10-11: 500 mL via INTRAVENOUS

## 2020-10-11 MED ORDER — LINEZOLID 600 MG/300ML IV SOLN
600.0000 mg | Freq: Two times a day (BID) | INTRAVENOUS | Status: DC
  Administered 2020-10-11 – 2020-10-12 (×3): 600 mg via INTRAVENOUS
  Filled 2020-10-11 (×4): qty 300

## 2020-10-11 MED ORDER — SODIUM CHLORIDE 0.9 % IV BOLUS
250.0000 mL | Freq: Once | INTRAVENOUS | Status: AC
  Administered 2020-10-11: 250 mL via INTRAVENOUS

## 2020-10-11 NOTE — Progress Notes (Signed)
[place] Progress Note Patient Name: Gordon Vandunk DOB: [birth year] MRN: [id number]   Date of Service  10/11/2020  HPI/Events of Note  BP 111/50, MAP 64 mmHg on maximum pressors.  eICU Interventions  If MAP drops below 60 will give another 250 ml iv LR bolus.        Quamaine Webb U Lakeysha Slutsky 10/11/2020, 6:01 AM

## 2020-10-11 NOTE — Progress Notes (Signed)
Updated the daughters via phone. They were very appreciative of the update. I assured them that I would call them to bedside if our measures are unsuccessful. Continuing to monitor patient very closely.

## 2020-10-11 NOTE — Progress Notes (Signed)
Patient transported to CT and back to room 2N63 without complications.

## 2020-10-11 NOTE — Progress Notes (Signed)
NAME:  [name], MRN:  [id number], DOB:  [birth year], LOS: 32 ADMISSION DATE:  10/17/2020, CONSULTATION DATE:  1/12 REFERRING MD:  Dr. Rogene Houston, CHIEF COMPLAINT:  Cardiac arrest   Brief History:  75 year old male with renal transplant history admitted 1/12 after PEA arrest 12 minutes.  History of Present Illness:  75 year old male with PMH as below, which is significant for CKD (previously on HD, but is s/p renal transplant in 2005 at [facility]), AF not on [facility], who presented to [name] ED after cardiac arrest athome 1/12. Apparently 1/11 in the evening hours he developed chest pain and dyspnea, but proceeded to go to bed. Then 1/12 AM his wife awoke around 0930 and found him to be unresponsive and foaming at the mouth. He was still breathing. As she called EMS he fell to the floor. Upon EMS arrival the patient was pulseless and ACLS was initiated. CPR was performed for approximately 12 minutes with two doses of epinephrine. He suffered injury to his L periorbit at some point during all this, raising concern for head injury and he was treated as a level one trauma in ED. Upon arrival he was unresponsive and was immediately intubated. He was emergently taken for pan-CT scan. No operable injury was identified. PCCM asked to admit.   Past Medical History:   Paroxysmal AF (no AC)  ESRD S/p Renal transplant x2  HFrEF  HTN  HLD  T2DM  PAD  Significant Hospital Events:  1/12 admit for PEA arrest 1/13 Pneumothorax development with pigtail catheter placed. Patient had increasing pressor requirements.  1/14 start CRRT 1/18 change chest tube to water seal 1/19 new Lt pneumothorax; changed to no CPR/defibrillation 1/20 transfuse PRBC  Consults:  Neurosurgery (S/o) Trauma surgery (S/o) Nephrology Neurology  Palliative care  Procedures:  CVL R subclavian 1/12 > ETT 1/12 > Rt pigtail catheter 1/14 >  R IJ HD cath 1/14 >>  Lt pigtail catheter 1/19 >>   Significant Diagnostic  Tests:  1/12 CTA head > no large vessel occlusion. Mod-severe stenosis of the dominant R vert.  1/12 CT Maxillofacial > Subtle fracture of the posterior wall of the right maxillary sinus is new since September and likely acute. Trace fluid within the sinus. Hematoma/contusion involving the right parotid gland and regional subcutaneous tissues. Left forehead and periorbital hematoma without underlying fracture. 1/12 CT chest/abd/pelv > Numerous acute rib fractures: Right ribs 1 through 7 (with rib 2 through 4 flail segment) and left ribs 2 through 7. Mild intercostal hematoma, but no pneumothorax, hemothorax or pulmonary contusion. Low-density layering pleural effusions appear mildly increased from last month. Chronic atelectasis. MRI Brain 1/14 >> scattered small acute to early subacute cerebral and cerebellar infarcts, few scattered chronic cerebral microhemorrhages bilaterally, small amount of petechial blood associated with small left occipital infarct, possible more global hypoxic ischemic injury given subtle diffusion-weighted signal hyperintensity involving cortex posteriorly  Micro Data:  1/12 COVID/Flu >> negative 1/12 Urine Cultures > Negative 1/13 Blood Cultures > negative 1/19 Sputum >> Pseudomonas pansensitive 1/19 Blood >>  1/19 urine >> 1/22 Pleural culture  Antimicrobials:  Ceftriaxone 1/13 >> 1/14; 1/16 >> 1/18  Cefepime 1/14 >> 1/16 Cefepime 1/18 >  Linezolid 1/22 >  Interim History / Subjective:  Family discussion held yesterday with wife and two daughters. Offered palliative care however family declined. Updated them on patient's clinical course including worsening shock related to empyema. We also discussed that his low likelihood for neurologic recovery. Goals of care including  the possibility of trach and peg and if patient would wish for long term life support. Family expressed understanding and wish to pursue aggressive care at this time but they know they also want to  limit his suffering. We all acknowledged this was a difficult situation and that family needs time to process and discuss his care before making a decision regarding long term care. At this time, he remains in the ICU in critical condition so this needs to be overcome first before long term care can even be considered.  Our plan is to continue DNR status with aggressive medical care at this point.  In the last 24 hours, he has had increased pressor requirement and currently on the max doses. Remains on CRRT. Continues to have high vent requirement needing 70% FIO2  Objective   Blood pressure (!) 111/50, pulse 78, temperature (!) 95.3 F (35.2 C), temperature source Axillary, resp. rate (!) 26, height 5\' 11"  (1.803 m), weight 76.4 kg, SpO2 100 %.    Vent Mode: PCV FiO2 (%):  [70 %-80 %] 70 % Set Rate:  [24 bmp] 24 bmp PEEP:  [5 cmH20] 5 cmH20 Plateau Pressure:  [16 cmH20-25 cmH20] 22 cmH20   Intake/Output Summary (Last 24 hours) at 10/11/2020 0926 Last data filed at 10/11/2020 0900 Gross per 24 hour  Intake 4449.77 ml  Output 2833 ml  Net 1616.77 ml   Filed Weights   10/09/20 0500 10/10/20 0357 10/11/20 0152  Weight: 77.8 kg 76.5 kg 76.4 kg   Physical Exam: General: Critically ill-appearing, no acute distress HENT: [place], AT, ETT in place Eyes: Reactive pupils with irregular left pupil. EOMI, left periorbital ecchymosis Lymph: no cervical lymphadenopathy Respiratory: Diminished breath sounds bilaterally.  No crackles, wheezing or rales. Left chest tube with pus draining. Right chest tube with no air leak. Cardiovascular: RRR, -M/R/G, no JVD GI: BS+, soft, nontender Extremities:-Edema,-tenderness Neuro: Eyes open, no tracking noted, does not follow commands, withdraws upper extremities to noxious stimuli GU: Foley in place   Resolved Hospital Problem list     Assessment & Plan:   Acute anoxic encephalopathy 2nd to PEA cardiac arrest, b/l acute subcortical infarcts (possibly  embolic) Acute encephalopathy secondary to critical illness -Appreciate Neurology assistance. D/w Dr. Darius Bump. Minimal progress per team -CT head ordered -Continue ASA -RASS goal 0 to -1  Acute hypoxic respiratory failure with compromised airway 2nd to PEA arrest, acute pulmonary edema and aspiration pneumonia. Rt pneumothorax after CPR. Spontaneous Lt pneumothorax on 1/19. Pseudomonas pneumonia 1 of 4 cultures with S. Epidermis, likely contaminant -Wean vent settings per ARDS protocol -VAP -Continue bilateral chest tubes to suction -20 cm H2O. No plan to remove until patient can be extubated  Septic shock secondary to Pseudomonas pneumonia -Day 10 of ABx; changed back to cefepime on 1/18 in setting of worsening hypotension -Add Linezolid. Continue Cefepime -Obtain pleural culture  -500cc IVF bolus -Wean Levophed and vasopressin for MAP goal >65  AKI in setting of shock from PEA arrest. CKD s/p renal transplant. -Nephrology following.  Currently on CRRT -Continue cellcept, prednisone  PEA arrest PAF, chronic combined CHF. -Telemetry -Holding anticoagulation due to thrombocytopenia  DM type 2 poorly controlled with hyperglycemia. Having episodes of hypoglycemia on 1/20. -SSI -D10 as he hasn't been able to tolerate tube feeds  R, L rib fx: flail segments by CT on the right. R maxillary sinus wall fracture. -Pain management  Anemia of critical illness and chronic disease. Thrombocytopenia. -S/p PRBC 1/19 -Transfuse for Hb < 7,  PLT < 10K, or significant bleeding -Obtain HIT panel  Goals of care. -Palliative care following -Prognosis for meaningful recovery seems grim -We have given trial of CRRT and minimal neurologic recovery has been seen.  Discussed [place] on 1/21. Family would like to regroup tomorrow to discuss with me patient's status and GOC.  Best practice (evaluated daily)  Diet: Tube Feeds  DVT prophylaxis: SQH GI prophylaxis: Pepcid Mobility:  BR Disposition: FULL Goals of care: full code Family: Family meeting today  Labs    CMP Latest Ref Rng & Units 10/11/2020 10/10/2020 10/10/2020  Glucose 70 - 99 mg/dL 129(H) 215(H) 168(H)  BUN 8 - 23 mg/dL 21 24(H) 28(H)  Creatinine 0.61 - 1.24 mg/dL 1.09 1.08 1.18  Sodium 135 - 145 mmol/L 136 133(L) 135  Potassium 3.5 - 5.1 mmol/L 4.3 4.9 4.5  Chloride 98 - 111 mmol/L 103 101 101  CO2 22 - 32 mmol/L 22 21(L) 22  Calcium 8.9 - 10.3 mg/dL 7.0(L) 7.0(L) 7.1(L)  Total Protein 6.5 - 8.1 g/dL - - -  Total Bilirubin 0.3 - 1.2 mg/dL - - -  Alkaline Phos 38 - 126 U/L - - -  AST 15 - 41 U/L - - -  ALT 0 - 44 U/L - - -    CBC Latest Ref Rng & Units 10/11/2020 10/10/2020 10/09/2020  WBC 4.0 - 10.5 K/uL 28.1(H) 31.9(H) 42.3(H)  Hemoglobin 13.0 - 17.0 g/dL 7.2(L) 7.9(L) 8.1(L)  Hematocrit 39.0 - 52.0 % 24.4(L) 24.7(L) 24.8(L)  Platelets 150 - 400 K/uL 34(L) 47(L) 60(L)    ABG    Component Value Date/Time   PHART 7.298 (L) 10/03/2020 1218   PCO2ART 49.4 (H) 10/12/2020 1218   PO2ART 85 10/20/2020 1218   HCO3 18.1 (L) 10/02/2020 1614   TCO2 19 (L) 10/02/2020 1614   ACIDBASEDEF 7.0 (H) 10/02/2020 1614   O2SAT 54.0 10/02/2020 1614    CBG (last 3)  Recent Labs    10/10/20 2343 10/11/20 0333 10/11/20 0821  GLUCAP 151* 119* 97    Critical care time: 35 minutes   The patient is critically ill with multiple organ systems failure and requires high complexity decision making for assessment and support, frequent evaluation and titration of therapies, application of advanced monitoring technologies and extensive interpretation of multiple databases.   Rodman Pickle, M.D. [facility] Pulmonary/Critical Care Medicine 10/11/2020 9:26 AM   Please see Amion for pager number to reach on-call Pulmonary and Critical Care Team.

## 2020-10-11 NOTE — Progress Notes (Signed)
Assisted tele visit to patient with daughter.  Tamalyn Wadsworth M, RN

## 2020-10-11 NOTE — Progress Notes (Signed)
[facility] Progress Note   29M s/p PEA arrest, persistent AMS, CKD4 s/p ddKT 2005 Tac/MMF/Pred  Assessment:   1. PEA cardiac arrest, out of hospital. CPR 12 min. Etiology unclear.  2. Septic shock, recurrent on pressors 3. Likely new pneumonia, hypoxic RF 4. AKI on CKD4, anuric. CRRT start date 10/03/20, line placement via CCM 1/14 5. CKD4, h/o renal transplant x 2 ('83 & '05). Baseline Cr ~2-2.2. Tac on hold 2/2 AMS #8 6. Anion gap metabolic acidosis; resolved 7. Hyperkalemia, mild; resolved 8. Encephalopathy, hypoxic and toxic metabolic, neuro following 9. 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 DM2 11. Hypophosphatemia  Plan:  Cont CRRT at current settings, no change  Unlikely to survive, would rec ongoing [place] conversations.  Plan for family meeting tomorrow per PCCM.  Cont 4K bath  Cont to hold Tac  Subjective:    Remains intubated and sedated.  Tolerating CRRT well.  Not much clinical improvement.  Discussed with ICU nurse and PCCM.    Objective:   BP (!) 111/45 (BP Location: Left Arm)   Pulse 82   Temp (!) 95.3 F (35.2 C) (Axillary)   Resp (!) 26   Ht _0  (1.803 m)   Wt 76.4 kg   SpO2 100%   BMI 23.49 kg/m   Intake/Output Summary (Last 24 hours) at 10/11/2020 0839 Last data filed at 10/11/2020 0800 Gross per 24 hour  Intake 4442.75 ml  Output 2935 ml  Net 1507.75 ml   Weight change: -0.1 kg  Physical Exam: Gen:ill appearing CVS:reg rate Resp:intubated Abd:nd Ext: anasarca Neuro: sedated, unresponsive Access: rij temp line c/d/i  Imaging: DG Chest Port 1 View  Result Date: 10/10/2020 CLINICAL DATA:  Respiratory failure EXAM: PORTABLE CHEST 1 VIEW COMPARISON:  10/09/2020 FINDINGS: Endotracheal tube in good position. NG tube enters the stomach. Right jugular central venous catheter in the proximal SVC. Right subclavian central venous catheter distal SVC. Bilateral pigtail chest tube in place. No pneumothorax.  Improvement in subcutaneous emphysema. Right lower lobe airspace disease with mild improvement. Moderate left lower lobe airspace disease unchanged from yesterday. IMPRESSION: Support lines remain in good position No pneumothorax identified Bibasilar airspace disease Electronically Signed   By: Franchot Gallo M.D.   On: 10/10/2020 07:53    Labs: BMET Recent Labs  Lab 10/08/20 0307 10/08/20 1600 10/08/20 2053 10/09/20 0405 10/09/20 1525 10/10/20 0423 10/10/20 1838 10/11/20 0252  NA 137 134* 136 138 135 135 133* 136  K 4.2 6.5* 5.4* 4.7 5.3* 4.5 4.9 4.3  CL 103 100 100 103 101 101 101 103  CO2 22 18* 20* 23 21* 22 21* 22  GLUCOSE 111* 239* 190* 137* 196* 168* 215* 129*  BUN 41* 48* 46* 38* 30* 28* 24* 21  CREATININE 1.26* 1.56* 1.47* 1.28* 1.25* 1.18 1.08 1.09  CALCIUM 7.2* 6.9* 6.9* 6.8* 7.0* 7.1* 7.0* 7.0*  PHOS 2.7 7.1*  --  3.5 3.6 2.9 3.1 2.8   CBC Recent Labs  Lab 10/04/20 1619 10/05/20 0311 10/09/20 0405 10/09/20 1139 10/10/20 0423 10/11/20 0252  WBC 10.6*   < > 32.4* 42.3* 31.9* 28.1*  NEUTROABS 9.6*  --   --   --   --   --   HGB 7.1*   < > 6.3* 8.1* 7.9* 7.2*  HCT 24.1*   < > 20.1* 24.8* 24.7* 24.4*  MCV 97.2   < > 94.4 89.9 91.1 94.6  PLT 103*   < > 61* 60* 47* 34*   < > =  values in this interval not displayed.    Medications:    . sodium chloride   Intravenous Once  . aspirin  81 mg Per Tube Daily  . atorvastatin  10 mg Per Tube Daily  . chlorhexidine gluconate (MEDLINE KIT)  15 mL Mouth Rinse BID  . Chlorhexidine Gluconate Cloth  6 each Topical Daily  . famotidine  20 mg Per Tube Daily  . feeding supplement (PROSource TF)  45 mL Per Tube QID  . insulin aspart  0-20 Units Subcutaneous Q4H  . latanoprost  1 drop Both Eyes QHS  . mouth rinse  15 mL Mouth Rinse 10 times per day  . mycophenolate  500 mg Per Tube BID  . predniSONE  5 mg Per Tube q AM  . sodium chloride flush  10 mL Intracatheter Q8H  . sodium chloride flush  10-40 mL Intracatheter Q12H    Marcy Bogosian Tanna Furry, MD  10/11/2020, 8:39 AM

## 2020-10-11 NOTE — Progress Notes (Signed)
[facility] Progress Note Patient Name: [name] DOB: 09-15-1946 MRN: [id number]   Date of Service  10/11/2020  HPI/Events of Note  Blood pressure remains soft after 25 gm of 25 % Albumin bolus. Hie net fluid balance is - 1500 ml.  eICU Interventions  NS 250 ml iv bolus x 1, Vasopressin infusion increased to 0.04 mcg.        [name] 10/11/2020, 12:04 AM

## 2020-10-11 NOTE — Progress Notes (Signed)
I provided the two daughters with an update on the patient. They were very appreciative of the care he is receiving. They informed me that they would be calling back throughout the day to check on their dad. Reassured them that this was fine. Will continue to monitor patient.

## 2020-10-11 NOTE — Progress Notes (Signed)
Subjective: No significant change today neurologically  Exam: Vitals:   10/11/20 0800 10/11/20 0900  BP: (!) 111/45 (!) 111/50  Pulse: 82 78  Resp: (!) 26 (!) 26  Temp:    SpO2: 100% 100%   Gen: In bed, intubated Eyes: Left eye is bloodshot Resp: Ventilated Abd: soft, nt  Neuro: MS: Eyes are open, he attempts to turn his head towards voice, but does not follow commands. He does not move his eyes towards voice. CN: Pupils reactive bilaterally, but the left is irregular(they have been small on previous days due to fentanyl, so unclear if change is due to this, or if this is a real change), corneals intact  Motor: He withdraws vs flexion in all extremities to noxious stimulation, but does not clearly cross midline or clavicle.  He does not grab my hand when performing noxious stimulation even on the same side.. Sensory: As above  Pertinent Labs: Cr 1.18  Impression: 75 yo M with persistent encephalopathy in the setting of cardiac arrest with suggestion of hypoxic injury on MRI in the posterior regions, multiple small infarcts. My suspicion is for a multifactorial process, though given the degree of his encephalopathy, I am concerned for significant hypoxic injury in addition to his strokes.  He has not demonstrated much improvement over the course of the past few days, and has developed more medical complications which has prompted the critical care service to involve palliative care.  Appreciate their involvement.  Recommendations: 1) neurology will continue to follow.  2) continue ASA for stroke prevention  3) already on low dose statin at home, no need for increase as unlikley to be atherogenic 4) continue DM management 5) will need pt, ot if he survives.  6) Agree with palliative care involvement.  Roland Rack, MD [facility] [phone]  If 7pm- 7am, please page neurology on call as listed in [facility].

## 2020-10-12 DIAGNOSIS — I469 Cardiac arrest, cause unspecified: Secondary | ICD-10-CM | POA: Diagnosis not present

## 2020-10-12 DIAGNOSIS — S270XXA Traumatic pneumothorax, initial encounter: Secondary | ICD-10-CM | POA: Diagnosis not present

## 2020-10-12 DIAGNOSIS — J9601 Acute respiratory failure with hypoxia: Secondary | ICD-10-CM | POA: Diagnosis not present

## 2020-10-12 DIAGNOSIS — Z9689 Presence of other specified functional implants: Secondary | ICD-10-CM | POA: Diagnosis not present

## 2020-10-12 DIAGNOSIS — I639 Cerebral infarction, unspecified: Secondary | ICD-10-CM | POA: Diagnosis not present

## 2020-10-12 LAB — RENAL FUNCTION PANEL
Albumin: 1.7 g/dL — ABNORMAL LOW (ref 3.5–5.0)
Albumin: 1.7 g/dL — ABNORMAL LOW (ref 3.5–5.0)
Anion gap: 12 (ref 5–15)
Anion gap: 12 (ref 5–15)
BUN: 19 mg/dL (ref 8–23)
BUN: 16 mg/dL (ref 8–23)
CO2: 20 mmol/L — ABNORMAL LOW (ref 22–32)
CO2: 20 mmol/L — ABNORMAL LOW (ref 22–32)
Calcium: 6.9 mg/dL — ABNORMAL LOW (ref 8.9–10.3)
Calcium: 7 mg/dL — ABNORMAL LOW (ref 8.9–10.3)
Chloride: 103 mmol/L (ref 98–111)
Chloride: 101 mmol/L (ref 98–111)
Creatinine, Ser: 1.12 mg/dL (ref 0.61–1.24)
Creatinine, Ser: 1 mg/dL (ref 0.61–1.24)
GFR, Estimated: >60 mL/min (ref >60)
GFR, Estimated: >60 mL/min (ref >60)
Glucose, Bld: 88 mg/dL (ref 70–99)
Glucose, Bld: 160 mg/dL — ABNORMAL HIGH (ref 70–99)
Phosphorus: 2.2 mg/dL — ABNORMAL LOW (ref 2.5–4.6)
Phosphorus: 4.3 mg/dL (ref 2.5–4.6)
Potassium: 4.2 mmol/L (ref 3.5–5.1)
Potassium: 5 mmol/L (ref 3.5–5.1)
Sodium: 135 mmol/L (ref 135–145)
Sodium: 133 mmol/L — ABNORMAL LOW (ref 135–145)

## 2020-10-12 LAB — HEMOGLOBIN AND HEMATOCRIT, BLOOD
HCT: 23.4 % — ABNORMAL LOW (ref 39.0–52.0)
Hemoglobin: 7.4 g/dL — ABNORMAL LOW (ref 13.0–17.0)

## 2020-10-12 LAB — CBC
HCT: 21.7 % — ABNORMAL LOW (ref 39.0–52.0)
Hemoglobin: 6.7 g/dL — CL (ref 13.0–17.0)
MCH: 29.3 pg (ref 26.0–34.0)
MCHC: 30.9 g/dL (ref 30.0–36.0)
MCV: 94.8 fL (ref 80.0–100.0)
Platelets: 26 10*3/uL — CL (ref 150–400)
RBC: 2.29 MIL/uL — ABNORMAL LOW (ref 4.22–5.81)
RDW: 18.9 % — ABNORMAL HIGH (ref 11.5–15.5)
WBC: 17.4 10*3/uL — ABNORMAL HIGH (ref 4.0–10.5)
nRBC: 0.3 % — ABNORMAL HIGH (ref 0.0–0.2)

## 2020-10-12 LAB — CULTURE, BLOOD (ROUTINE X 2)

## 2020-10-12 LAB — GLUCOSE, CAPILLARY
Glucose-Capillary: 88 mg/dL (ref 70–99)
Glucose-Capillary: 100 mg/dL — ABNORMAL HIGH (ref 70–99)
Glucose-Capillary: 74 mg/dL (ref 70–99)
Glucose-Capillary: 111 mg/dL — ABNORMAL HIGH (ref 70–99)
Glucose-Capillary: 144 mg/dL — ABNORMAL HIGH (ref 70–99)
Glucose-Capillary: 143 mg/dL — ABNORMAL HIGH (ref 70–99)
Glucose-Capillary: 125 mg/dL — ABNORMAL HIGH (ref 70–99)

## 2020-10-12 LAB — PREPARE RBC (CROSSMATCH)

## 2020-10-12 LAB — LACTIC ACID, PLASMA
Lactic Acid, Venous: 6.2 mmol/L (ref 0.5–1.9)
Lactic Acid, Venous: 6.3 mmol/L (ref 0.5–1.9)

## 2020-10-12 MED ORDER — CALCIUM GLUCONATE-NACL 1-0.675 GM/50ML-% IV SOLN
1.0000 g | Freq: Once | INTRAVENOUS | Status: AC
  Administered 2020-10-12: 1000 mg via INTRAVENOUS
  Filled 2020-10-12: qty 50

## 2020-10-12 MED ORDER — ALBUMIN HUMAN 25 % IV SOLN
12.5000 g | Freq: Once | INTRAVENOUS | Status: AC
  Administered 2020-10-12: 12.5 g via INTRAVENOUS
  Filled 2020-10-12: qty 50

## 2020-10-12 MED ORDER — POTASSIUM PHOSPHATES 15 MMOLE/5ML IV SOLN
20.0000 mmol | Freq: Once | INTRAVENOUS | Status: AC
  Administered 2020-10-12: 20 mmol via INTRAVENOUS
  Filled 2020-10-12: qty 6.67

## 2020-10-12 MED ORDER — LACTATED RINGERS IV BOLUS
500.0000 mL | Freq: Once | INTRAVENOUS | Status: AC
  Administered 2020-10-12: 500 mL via INTRAVENOUS

## 2020-10-12 MED ORDER — SODIUM CHLORIDE 0.9% IV SOLUTION: Freq: Once | INTRAVENOUS | Status: AC

## 2020-10-12 MED ORDER — DEXTROSE 10 % IV SOLN: INTRAVENOUS | Status: DC

## 2020-10-12 MED ORDER — MYCOPHENOLATE 200 MG/ML ORAL SUSPENSION
500.0000 mg | Freq: Two times a day (BID) | ORAL | Status: DC
  Administered 2020-10-12 – 2020-10-13 (×3): 500 mg
  Filled 2020-10-12 (×3): qty 10

## 2020-10-12 NOTE — Progress Notes (Signed)
[facility] Progress Note Patient Name: [name] DOB: 1945-09-21 MRN: [id number]   Date of Service  10/12/2020  HPI/Events of Note  Anemia - Hgb = 6.7.   eICU Interventions  Will transfuse 1 unit PRBC.      Intervention Category Major Interventions: Other:  Lysle Dingwall 10/12/2020, 3:54 AM

## 2020-10-12 NOTE — Progress Notes (Signed)
CRITICAL VALUE ALERT  Critical Value:  Hgb 6.7, Plts 26  Date & Time Notied:  10/12/2020   [id number]  Provider Notified: elink  Orders Received/Actions taken: awaiting orders

## 2020-10-12 NOTE — Progress Notes (Signed)
Updated patients daughters via phone. The plan is to have a meeting today with Dr. Loanne Drilling to reevaluate the patients status. They will call day time nurse later today to find out the best time to come in.

## 2020-10-12 NOTE — Progress Notes (Signed)
[facility] Physician-Brief Progress Note Patient Name: [name] DOB: Nov 02, 1945 MRN: [id number]   Date of Service  10/12/2020  HPI/Events of Note  Nursing reports increase in pauses in EKG. Monitor shows AFIB with ventricular rate = 70.   eICU Interventions  Plan: 1. BMP STAT. 2. ABG STAT.     Intervention Category Major Interventions: Arrhythmia - evaluation and management  Pamlea Finder Cornelia Copa 10/12/2020, 11:18 PM

## 2020-10-12 NOTE — Progress Notes (Signed)
NAME:  [name], MRN:  [id number], DOB:  12/14/1945, LOS: 43 ADMISSION DATE:  10/11/2020, CONSULTATION DATE:  1/12 REFERRING MD:  Dr. Rogene Houston, CHIEF COMPLAINT:  Cardiac arrest   Brief History:  75 year old male with renal transplant history admitted 1/12 after PEA arrest 12 minutes.  History of Present Illness:  75 year old male with PMH as below, which is significant for CKD (previously on HD, but is s/p renal transplant in 2005 at [facility]), AF not on [facility], who presented to [name] ED after cardiac arrest athome 1/12. Apparently 1/11 in the evening hours he developed chest pain and dyspnea, but proceeded to go to bed. Then 1/12 AM his wife awoke around 0930 and found him to be unresponsive and foaming at the mouth. He was still breathing. As she called EMS he fell to the floor. Upon EMS arrival the patient was pulseless and ACLS was initiated. CPR was performed for approximately 12 minutes with two doses of epinephrine. He suffered injury to his L periorbit at some point during all this, raising concern for head injury and he was treated as a level one trauma in ED. Upon arrival he was unresponsive and was immediately intubated. He was emergently taken for pan-CT scan. No operable injury was identified. PCCM asked to admit.   Past Medical History:   Paroxysmal AF (no AC)  ESRD S/p Renal transplant x2  HFrEF  HTN  HLD  T2DM  PAD  Significant Hospital Events:  1/12 admit for PEA arrest 1/13 Pneumothorax development with pigtail catheter placed. Patient had increasing pressor requirements.  1/14 start CRRT 1/18 change chest tube to water seal 1/19 new Lt pneumothorax; changed to no CPR/defibrillation 1/20 transfuse PRBC  Consults:  Neurosurgery (S/o) Trauma surgery (S/o) Nephrology Neurology  Palliative care  Procedures:  CVL R subclavian 1/12 > ETT 1/12 > Rt pigtail catheter 1/14 >  R IJ HD cath 1/14 >>  Lt pigtail catheter 1/19 >>   Significant Diagnostic  Tests:  1/12 CTA head > no large vessel occlusion. Mod-severe stenosis of the dominant R vert.  1/12 CT Maxillofacial > Subtle fracture of the posterior wall of the right maxillary sinus is new since September and likely acute. Trace fluid within the sinus. Hematoma/contusion involving the right parotid gland and regional subcutaneous tissues. Left forehead and periorbital hematoma without underlying fracture. 1/12 CT chest/abd/pelv > Numerous acute rib fractures: Right ribs 1 through 7 (with rib 2 through 4 flail segment) and left ribs 2 through 7. Mild intercostal hematoma, but no pneumothorax, hemothorax or pulmonary contusion. Low-density layering pleural effusions appear mildly increased from last month. Chronic atelectasis. MRI Brain 1/14 >> scattered small acute to early subacute cerebral and cerebellar infarcts, few scattered chronic cerebral microhemorrhages bilaterally, small amount of petechial blood associated with small left occipital infarct, possible more global hypoxic ischemic injury given subtle diffusion-weighted signal hyperintensity involving cortex posteriorly CT Head wo Contrast 1/22 Redemonstration prior small right parietal and bilateral occipital infarcts. No acute infarct or hemorrhage  Micro Data:  1/12 COVID/Flu >> negative 1/12 Urine Cultures > Negative 1/13 Blood Cultures > negative 1/19 Sputum >> Pseudomonas pansensitive 1/19 Blood >>  1/19 urine >> 1/22 Pleural culture  Antimicrobials:  Ceftriaxone 1/13 >> 1/14; 1/16 >> 1/18  Cefepime 1/14 >> 1/16 Cefepime 1/18 >  Linezolid 1/22 >  Interim History / Subjective:  In the last 48 hours, he has remained on max pressor support. CRRT fluid removal was paused and currently net positive to  see if this could help improve pressor support. O2 requirement has improved after drainage of empyema. Chest tube output 820cc yesterday.  Family discussion held today on 1/23. Family updated on patient's clinical course including  persistent shock for his pneumonia/empyema. Remains on CRRT.  Objective   Blood pressure (!) 111/43, pulse 64, temperature (!) 97.3 F (36.3 C), temperature source Oral, resp. rate (!) 27, height 5\' 11"  (1.803 m), weight 75.9 kg, SpO2 100 %. CVP:  [6 mmHg] 6 mmHg  Vent Mode: PCV FiO2 (%):  [60 %-80 %] 60 % Set Rate:  [24 bmp] 24 bmp PEEP:  [5 cmH20-8 cmH20] 8 cmH20 Plateau Pressure:  [21 cmH20-27 cmH20] 24 cmH20   Intake/Output Summary (Last 24 hours) at 10/12/2020 1306 Last data filed at 10/12/2020 1300 Gross per 24 hour  Intake 4914.11 ml  Output 2812 ml  Net 2102.11 ml   Filed Weights   10/10/20 0357 10/11/20 0152 10/12/20 0117  Weight: 76.5 kg 76.4 kg 75.9 kg   Physical Exam: General: Critically ill-appearing, no acute distress HENT: [place], AT, ETT in place Eyes: Reactive pupils with irregularly shaped left pupil. Left periorbital ecchymosis Respiratory: Clear to auscultation bilaterally.  No crackles, wheezing or rales. Left chest tube with pus draining. Right chest tube with no air leak. 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 Dr. Leonel Ramsay discussed neurologic prognosis with family. -Continue ASA -RASS goal 0 to -1  Acute hypoxic respiratory failure with compromised airway 2nd to PEA arrest, acute pulmonary edema and aspiration pneumonia. Rt pneumothorax after CPR. Spontaneous Lt pneumothorax on 1/19. Pseudomonas pneumonia complicated by left empyema -Wean vent settings per ARDS protocol -Continue bilateral chest tubes to suction -20 cm H2O. No plan to remove until patient can be  extubated -VAP  Septic shock secondary to Pseudomonas pneumonia complicated by empyema 1 of 4 cultures with S. Epidermis, likely contaminant -Day 11 of ABx; changed back to cefepime on 1/18 in setting of worsening hypotension -Continue Cefepime. DC linezolid based on culture -Pleural culture +Pseudomonas aeruginosa. F/u sensitivities -Wean Levophed and vasopressin for MAP goal >65 -Trend LA -IVF bolus PRN  AKI in setting of shock from PEA arrest. CKD s/p renal transplant. -Nephrology following.  Currently on CRRT -Continue cellcept, prednisone  PEA arrest PAF, chronic combined CHF. -Telemetry -Holding anticoagulation due to thrombocytopenia  DM type 2 poorly controlled with hyperglycemia. Having episodes of hypoglycemia on 1/20. -SSI -D10 as he hasn't been able to tolerate tube feeds  R, L rib fx: flail segments by CT on the right. R maxillary sinus wall fracture. -Pain management  Anemia of critical illness and chronic disease. Thrombocytopenia. -S/p PRBC 1/19 -Transfuse for Hb < 7, PLT < 10K, or significant bleeding -F/u HIT panel  Goals of care. -Palliative care following -Prognosis for meaningful recovery seems grim -Family meeting with Neurology and Critical Care on 1/23. After discussion regarding goals of care, family has expressed that they do not "want to give up" or "have any regrets" as they have seen Mr. Pavao fight through prior illnesses. They express full understanding how critically ill he is and wish to continue current care. They do not want him to have chest compressions if he were to  naturally pass but would like to continue to receive aggressive care. They are unsure if they would pursue tracheostomy but prefer to discuss it if/when he survives to that point. -Code status remains DNR  Best practice (evaluated daily)  Diet: Tube Feeds  DVT prophylaxis: SQH GI prophylaxis: Pepcid Mobility: BR Disposition: FULL Goals of care: full code Family:  Family meeting today  Labs    CMP Latest Ref Rng & Units 10/12/2020 10/11/2020 10/11/2020  Glucose 70 - 99 mg/dL 88 186(H) 129(H)  BUN 8 - 23 mg/dL 19 21 21   Creatinine 0.61 - 1.24 mg/dL 1.12 1.09 1.09  Sodium 135 - 145 mmol/L 135 135 136  Potassium 3.5 - 5.1 mmol/L 4.2 4.9 4.3  Chloride 98 - 111 mmol/L 103 102 103  CO2 22 - 32 mmol/L 20(L) 21(L) 22  Calcium 8.9 - 10.3 mg/dL 6.9(L) 6.9(L) 7.0(L)  Total Protein 6.5 - 8.1 g/dL - - -  Total Bilirubin 0.3 - 1.2 mg/dL - - -  Alkaline Phos 38 - 126 U/L - - -  AST 15 - 41 U/L - - -  ALT 0 - 44 U/L - - -    CBC Latest Ref Rng & Units 10/12/2020 10/12/2020 10/11/2020  WBC 4.0 - 10.5 K/uL - 17.4(H) 28.1(H)  Hemoglobin 13.0 - 17.0 g/dL 7.4(L) 6.7(LL) 7.2(L)  Hematocrit 39.0 - 52.0 % 23.4(L) 21.7(L) 24.4(L)  Platelets 150 - 400 K/uL - 26(LL) 34(L)    ABG    Component Value Date/Time   PHART 7.298 (L) 09/30/2020 1218   PCO2ART 49.4 (H) 09/30/2020 1218   PO2ART 85 09/29/2020 1218   HCO3 18.1 (L) 10/02/2020 1614   TCO2 19 (L) 10/02/2020 1614   ACIDBASEDEF 7.0 (H) 10/02/2020 1614   O2SAT 54.0 10/02/2020 1614    CBG (last 3)  Recent Labs    10/12/20 0345 10/12/20 0755 10/12/20 1119  GLUCAP 100* 74 111*    Critical care time: 35 minutes   The patient is critically ill with multiple organ systems failure and requires high complexity decision making for assessment and support, frequent evaluation and titration of therapies, application of advanced monitoring technologies and extensive interpretation of multiple databases.  Independent Critical Care Time:  Minutes.   Rodman Pickle, M.D. [facility] Pulmonary/Critical Care Medicine 10/12/2020 1:06 PM   Please see Amion for pager number to reach on-call Pulmonary and Critical Care Team.

## 2020-10-12 NOTE — Progress Notes (Signed)
MD notified that RT unable to obtain ABG after multiple unsuccessful attempts. Okay to d/c abg order at this time. RT will continue to monitor and be available as needed.

## 2020-10-12 NOTE — Progress Notes (Signed)
Subjective: No significant change today neurologically  Exam: Vitals:   10/12/20 1400 10/12/20 1415  BP: (!) 101/42 (!) 117/43  Pulse: 71 73  Resp: (!) 27 (!) 26  Temp:    SpO2: 100% 100%   Gen: In bed, intubated Eyes: Left eye is bloodshot Resp: Ventilated Abd: soft, nt  Neuro: MS: Eyes are open, he attempts to turn his head towards voice, but does not follow commands. He does not move his eyes towards voice. CN: Pupils reactive bilaterally, but the left is irregular(they have been small on previous days due to fentanyl, so unclear if change is due to this, or if this is a real change), corneals intact  Motor: He withdraws vs flexion in all extremities to noxious stimulation, but does not clearly cross midline or clavicle.  He does not grab my hand when performing noxious stimulation even on the same side.. Sensory: As above  Pertinent Labs: Cr 1.18  Impression: 75 yo M with persistent encephalopathy in the setting of cardiac arrest with suggestion of hypoxic injury on MRI in the posterior regions, multiple small infarcts. My suspicion is for a multifactorial process, though given the degree of his encephalopathy, I am concerned for significant hypoxic injury in addition to his strokes.  He has not demonstrated much improvement over the course of the past week, and has developed more medical complications which has prompted the critical care service to involve palliative care.  Appreciate their involvement.  I met with family today, and I discussed that any neurological improvement would be over the long-term and likely incomplete.  They are still hopeful, but without any change over the past week, and expectations for improvement in the short-term are relatively low.  His multifocal strokes are likely cardioembolic in the setting of his CPR and cardiac arrest.  Recommendations: 1) continue ASA for stroke prevention  2) already on low dose statin at home, no need for increase as  unlikley to be atherogenic 3) continue DM management 4) will need pt, ot if he survives.  5) Agree with palliative care involvement. 6) neurology will be available on an as-needed basis for family conversations, etc. moving forward.  Please call if family or service has any further questions.  Roland Rack, MD [facility] [phone]  If 7pm- 7am, please page neurology on call as listed in [place].

## 2020-10-12 NOTE — Progress Notes (Signed)
[facility] Progress Note   37M s/p PEA arrest, persistent AMS, CKD4 s/p ddKT 2005 Tac/MMF/Pred  Assessment:   1. PEA cardiac arrest, out of hospital. CPR 12 min. Etiology unclear.  2. Septic shock, recurrent on pressors 3. Likely new pneumonia, hypoxic RF 4. AKI on CKD4, anuric. CRRT start date 10/03/20, line placement via CCM 1/14 5. CKD4, h/o renal transplant x 2 ('83 & '05). Baseline Cr ~2-2.2. Tac on hold 2/2 AMS #8 6. Anion gap metabolic acidosis; resolved 7. Hyperkalemia, mild; resolved 8. Encephalopathy, hypoxic and toxic metabolic, neuro following 9. 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 DM2 11. Hypophosphatemia  Plan:  Cont CRRT at current settings, no change  Unlikely to survive, [place] conversations ongoing with PCCM, likely another family meeting today.    Cont 4K bath  Replete potassium phosphorus  Cont to hold Tac  Discussed with PCCM.  Subjective:   Remains intubated and sedated.  Tolerating CRRT well.  Not much clinical improvement.   Objective:   BP (!) 95/48   Pulse 77   Temp (!) 97.3 F (36.3 C) (Oral)   Resp (!) 30   Ht 5' 11"  (1.803 m)   Wt 75.9 kg   SpO2 95%   BMI 23.34 kg/m   Intake/Output Summary (Last 24 hours) at 10/12/2020 0919 Last data filed at 10/12/2020 0900 Gross per 24 hour  Intake 4426.61 ml  Output 2684 ml  Net 1742.61 ml   Weight change: -0.5 kg  Physical Exam: Gen: Critically ill looking male intubated and sedated CVS:reg rate Resp:intubated Abd:nd Ext: anasarca Neuro: sedated, unresponsive Access: rij temp line c/d/i  Imaging: CT HEAD WO CONTRAST  Result Date: 10/11/2020 CLINICAL DATA:  Mental status change, unknown cause EXAM: CT HEAD WITHOUT CONTRAST TECHNIQUE: Contiguous axial images were obtained from the base of the skull through the vertex without intravenous contrast. COMPARISON:  10/03/2020 and prior. FINDINGS: Brain: No intracranial hemorrhage. Redemonstration of small  prior infarcts involving the right parietal and bilateral occipital regions. Additional smaller bilateral cerebral/cerebellar and left basal ganglia insults are not well demonstrated. No new focal hypodensity. No mass lesion. No midline shift, ventriculomegaly or extra-axial fluid collection. Mild cerebral atrophy with ex vacuo dilatation. Vascular: No hyperdense vessel or unexpected calcification. Bilateral skull base atherosclerotic calcifications. Skull: Negative for fracture or focal lesion. Sinuses/Orbits: Decreased left periorbital and right facial edema. Minimal sinus mucosal thickening. Trace bilateral mastoid free fluid. Other: None. IMPRESSION: Redemonstration of prior small right parietal and bilateral occipital infarcts. Additional smaller prior insults involving the cerebrum/cerebellum and left basal ganglia are not well demonstrated. No new focal hypodensity to suggest acute infarct. No intracranial hemorrhage. Electronically Signed   By: Primitivo Gauze M.D.   On: 10/11/2020 11:42    Labs: BMET Recent Labs  Lab 10/09/20 0405 10/09/20 1525 10/10/20 0423 10/10/20 1838 10/11/20 0252 10/11/20 1606 10/12/20 0305  NA 138 135 135 133* 136 135 135  K 4.7 5.3* 4.5 4.9 4.3 4.9 4.2  CL 103 101 101 101 103 102 103  CO2 23 21* 22 21* 22 21* 20*  GLUCOSE 137* 196* 168* 215* 129* 186* 88  BUN 38* 30* 28* 24* 21 21 19   CREATININE 1.28* 1.25* 1.18 1.08 1.09 1.09 1.12  CALCIUM 6.8* 7.0* 7.1* 7.0* 7.0* 6.9* 6.9*  PHOS 3.5 3.6 2.9 3.1 2.8 2.9 2.2*   CBC Recent Labs  Lab 10/09/20 1139 10/10/20 0423 10/11/20 0252 10/12/20 0305 10/12/20 0837  WBC 42.3* 31.9* 28.1* 17.4*  --  HGB 8.1* 7.9* 7.2* 6.7* 7.4*  HCT 24.8* 24.7* 24.4* 21.7* 23.4*  MCV 89.9 91.1 94.6 94.8  --   PLT 60* 47* 34* 26*  --     Medications:    . sodium chloride   Intravenous Once  . aspirin  81 mg Per Tube Daily  . atorvastatin  10 mg Per Tube Daily  . chlorhexidine gluconate (MEDLINE KIT)  15 mL Mouth Rinse  BID  . Chlorhexidine Gluconate Cloth  6 each Topical Daily  . famotidine  20 mg Per Tube Daily  . feeding supplement (PROSource TF)  45 mL Per Tube QID  . insulin aspart  0-20 Units Subcutaneous Q4H  . latanoprost  1 drop Both Eyes QHS  . mouth rinse  15 mL Mouth Rinse 10 times per day  . mycophenolate  500 mg Per Tube BID  . predniSONE  5 mg Per Tube q AM  . sodium chloride flush  10 mL Intracatheter Q8H  . sodium chloride flush  10-40 mL Intracatheter Q12H   Nimrit Kehres Tanna Furry, MD  10/12/2020, 9:19 AM

## 2020-10-12 NOTE — Progress Notes (Signed)
[place] Progress Note Patient Name: [name] DOB: Feb 05, 1946 MRN: [id number]   Date of Service  10/12/2020  HPI/Events of Note  Hypocalcemia - Ca++ = 6.9 which corrects to 8.74 (Low) given albumin = 1.7.   eICU Interventions  Will replace Ca++.     Intervention Category Major Interventions: Electrolyte abnormality - evaluation and management  Lysle Dingwall 10/12/2020, 4:43 AM

## 2020-10-12 NOTE — Progress Notes (Signed)
CRITICAL VALUE ALERT  Critical Value:  Lactic Acid of 6.2  Date & Time Notied:  011/23/22 @1400   Provider Notified: Dr. Loanne Drilling  Orders Received/Actions taken: orders received for 500cc bolus of LR.

## 2020-10-12 NOTE — Progress Notes (Signed)
[place] Progress Note Patient Name: [name] DOB: 01/27/1946 MRN: [id number]   Date of Service  10/12/2020  HPI/Events of Note  Hypotension - BP = 84/44 with MAP = 56. CVP = 6.   eICU Interventions  Plan: 1. 25% Albumin 12.5 gm IV now.      Intervention Category Major Interventions: Hypotension - evaluation and management  Javari Bufkin [place] 10/12/2020, 6:45 AM

## 2020-10-13 DIAGNOSIS — Z9689 Presence of other specified functional implants: Secondary | ICD-10-CM

## 2020-10-13 DIAGNOSIS — J869 Pyothorax without fistula: Secondary | ICD-10-CM

## 2020-10-13 DIAGNOSIS — R579 Shock, unspecified: Secondary | ICD-10-CM | POA: Diagnosis not present

## 2020-10-13 DIAGNOSIS — J9601 Acute respiratory failure with hypoxia: Secondary | ICD-10-CM | POA: Diagnosis not present

## 2020-10-13 DIAGNOSIS — I469 Cardiac arrest, cause unspecified: Secondary | ICD-10-CM | POA: Diagnosis not present

## 2020-10-13 DIAGNOSIS — A498 Other bacterial infections of unspecified site: Secondary | ICD-10-CM | POA: Diagnosis not present

## 2020-10-13 LAB — DIC (DISSEMINATED INTRAVASCULAR COAGULATION)PANEL
D-Dimer, Quant: 3.13 ug/mL-FEU — ABNORMAL HIGH (ref 0.00–0.50)
Fibrinogen: 543 mg/dL — ABNORMAL HIGH (ref 210–475)
INR: 1.6 — ABNORMAL HIGH (ref 0.8–1.2)
Platelets: 19 10*3/uL — CL (ref 150–400)
Prothrombin Time: 18.7 seconds — ABNORMAL HIGH (ref 11.4–15.2)
Smear Review: NONE SEEN
aPTT: 47 seconds — ABNORMAL HIGH (ref 24–36)

## 2020-10-13 LAB — RENAL FUNCTION PANEL
Albumin: 1.6 g/dL — ABNORMAL LOW (ref 3.5–5.0)
Albumin: 1.7 g/dL — ABNORMAL LOW (ref 3.5–5.0)
Anion gap: 12 (ref 5–15)
Anion gap: 12 (ref 5–15)
BUN: 14 mg/dL (ref 8–23)
BUN: 14 mg/dL (ref 8–23)
CO2: 20 mmol/L — ABNORMAL LOW (ref 22–32)
CO2: 19 mmol/L — ABNORMAL LOW (ref 22–32)
Calcium: 7 mg/dL — ABNORMAL LOW (ref 8.9–10.3)
Calcium: 7.1 mg/dL — ABNORMAL LOW (ref 8.9–10.3)
Chloride: 102 mmol/L (ref 98–111)
Chloride: 103 mmol/L (ref 98–111)
Creatinine, Ser: 0.96 mg/dL (ref 0.61–1.24)
Creatinine, Ser: 0.98 mg/dL (ref 0.61–1.24)
GFR, Estimated: >60 mL/min (ref >60)
GFR, Estimated: >60 mL/min (ref >60)
Glucose, Bld: 133 mg/dL — ABNORMAL HIGH (ref 70–99)
Glucose, Bld: 103 mg/dL — ABNORMAL HIGH (ref 70–99)
Phosphorus: 2.8 mg/dL (ref 2.5–4.6)
Phosphorus: 3.1 mg/dL (ref 2.5–4.6)
Potassium: 4.6 mmol/L (ref 3.5–5.1)
Potassium: 4.8 mmol/L (ref 3.5–5.1)
Sodium: 134 mmol/L — ABNORMAL LOW (ref 135–145)
Sodium: 134 mmol/L — ABNORMAL LOW (ref 135–145)

## 2020-10-13 LAB — GLUCOSE, CAPILLARY
Glucose-Capillary: 131 mg/dL — ABNORMAL HIGH (ref 70–99)
Glucose-Capillary: 155 mg/dL — ABNORMAL HIGH (ref 70–99)
Glucose-Capillary: 173 mg/dL — ABNORMAL HIGH (ref 70–99)
Glucose-Capillary: 91 mg/dL (ref 70–99)
Glucose-Capillary: 91 mg/dL (ref 70–99)
Glucose-Capillary: 95 mg/dL (ref 70–99)

## 2020-10-13 LAB — BPAM RBC
Blood Product Expiration Date: 202202182359
Blood Product Expiration Date: 202202192359
ISSUE DATE / TIME: 202201200820
ISSUE DATE / TIME: 202201230405
Unit Type and Rh: 5100
Unit Type and Rh: 5100

## 2020-10-13 LAB — TYPE AND SCREEN
ABO/RH(D): O POS
Antibody Screen: NEGATIVE
Unit division: 0
Unit division: 0

## 2020-10-13 LAB — CBC
HCT: 26.3 % — ABNORMAL LOW (ref 39.0–52.0)
Hemoglobin: 7.6 g/dL — ABNORMAL LOW (ref 13.0–17.0)
MCH: 27.8 pg (ref 26.0–34.0)
MCHC: 28.9 g/dL — ABNORMAL LOW (ref 30.0–36.0)
MCV: 96.3 fL (ref 80.0–100.0)
Platelets: 21 10*3/uL — CL (ref 150–400)
RBC: 2.73 MIL/uL — ABNORMAL LOW (ref 4.22–5.81)
RDW: 19.2 % — ABNORMAL HIGH (ref 11.5–15.5)
WBC: 18.2 10*3/uL — ABNORMAL HIGH (ref 4.0–10.5)
nRBC: 0.4 % — ABNORMAL HIGH (ref 0.0–0.2)

## 2020-10-13 LAB — POCT I-STAT 7, (LYTES, BLD GAS, ICA,H+H)
Acid-base deficit: 6 mmol/L — ABNORMAL HIGH (ref 0.0–2.0)
Acid-base deficit: 7 mmol/L — ABNORMAL HIGH (ref 0.0–2.0)
Bicarbonate: 19.5 mmol/L — ABNORMAL LOW (ref 20.0–28.0)
Bicarbonate: 20.3 mmol/L (ref 20.0–28.0)
Calcium, Ion: 1.03 mmol/L — ABNORMAL LOW (ref 1.15–1.40)
Calcium, Ion: 1.03 mmol/L — ABNORMAL LOW (ref 1.15–1.40)
HCT: 23 % — ABNORMAL LOW (ref 39.0–52.0)
HCT: 24 % — ABNORMAL LOW (ref 39.0–52.0)
Hemoglobin: 7.8 g/dL — ABNORMAL LOW (ref 13.0–17.0)
Hemoglobin: 8.2 g/dL — ABNORMAL LOW (ref 13.0–17.0)
O2 Saturation: 92 %
O2 Saturation: 97 %
Patient temperature: 96.1
Patient temperature: 98.6
Potassium: 4.3 mmol/L (ref 3.5–5.1)
Potassium: 4.6 mmol/L (ref 3.5–5.1)
Sodium: 134 mmol/L — ABNORMAL LOW (ref 135–145)
Sodium: 135 mmol/L (ref 135–145)
TCO2: 21 mmol/L — ABNORMAL LOW (ref 22–32)
TCO2: 22 mmol/L (ref 22–32)
pCO2 arterial: 39.8 mmHg (ref 32.0–48.0)
pCO2 arterial: 39.8 mmHg (ref 32.0–48.0)
pH, Arterial: 7.297 — ABNORMAL LOW (ref 7.350–7.450)
pH, Arterial: 7.308 — ABNORMAL LOW (ref 7.350–7.450)
pO2, Arterial: 71 mmHg — ABNORMAL LOW (ref 83.0–108.0)
pO2, Arterial: 92 mmHg (ref 83.0–108.0)

## 2020-10-13 LAB — BASIC METABOLIC PANEL
Anion gap: 12 (ref 5–15)
BUN: 15 mg/dL (ref 8–23)
CO2: 19 mmol/L — ABNORMAL LOW (ref 22–32)
Calcium: 6.7 mg/dL — ABNORMAL LOW (ref 8.9–10.3)
Chloride: 100 mmol/L (ref 98–111)
Creatinine, Ser: 0.97 mg/dL (ref 0.61–1.24)
GFR, Estimated: >60 mL/min (ref >60)
Glucose, Bld: 135 mg/dL — ABNORMAL HIGH (ref 70–99)
Potassium: 4.5 mmol/L (ref 3.5–5.1)
Sodium: 131 mmol/L — ABNORMAL LOW (ref 135–145)

## 2020-10-13 LAB — LACTIC ACID, PLASMA: Lactic Acid, Venous: 6.4 mmol/L (ref 0.5–1.9)

## 2020-10-13 LAB — HEPARIN INDUCED PLATELET AB (HIT ANTIBODY): Heparin Induced Plt Ab: 0.103 OD (ref 0.000–0.400)

## 2020-10-13 LAB — CULTURE, BLOOD (ROUTINE X 2): Culture: NO GROWTH

## 2020-10-13 MED ORDER — CALCIUM GLUCONATE-NACL 1-0.675 GM/50ML-% IV SOLN
1.0000 g | Freq: Once | INTRAVENOUS | Status: AC
  Administered 2020-10-13: 1000 mg via INTRAVENOUS
  Filled 2020-10-13: qty 50

## 2020-10-13 MED ORDER — MUPIROCIN CALCIUM 2 % EX CREA
TOPICAL_CREAM | Freq: Two times a day (BID) | CUTANEOUS | Status: DC
  Administered 2020-10-13: 1 via TOPICAL
  Filled 2020-10-13: qty 15

## 2020-10-13 MED ORDER — VITAL AF 1.2 CAL PO LIQD
1000.0000 mL | ORAL | Status: DC
  Administered 2020-10-13: 1000 mL

## 2020-10-13 MED ORDER — HYDROCORTISONE NA SUCCINATE PF 100 MG IJ SOLR
50.0000 mg | Freq: Four times a day (QID) | INTRAMUSCULAR | Status: DC
  Administered 2020-10-13 – 2020-10-14 (×6): 50 mg via INTRAVENOUS
  Filled 2020-10-13 (×6): qty 2

## 2020-10-13 NOTE — Progress Notes (Signed)
[facility] Progress Note   72M s/p PEA arrest, persistent AMS, CKD4 s/p ddKT 2005 Tac/MMF/Pred  Assessment:   1. PEA cardiac arrest, out of hospital. CPR 12 min. Etiology unclear.  2. Septic shock, recurrent on pressors 3. Pneumonia, hypoxic RF 4. AKI on CKD4, anuric. CRRT start date 10/03/20, line placement via CCM 1/14 5. CKD4, h/o renal transplant x 2 ('83 & '05). Baseline Cr ~2-2.2. Tac on hold 2/2 AMS #8 6. Anion gap metabolic acidosis; resolved 7. Hyperkalemia, mild; resolved 8. Encephalopathy, hypoxic and toxic metabolic, neuro following 9. 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 DM2 11. Hypophosphatemia  Plan:  Cont CRRT at current settings, no change.  No heparin in setting of thrombocytopenia.  Prognosis poor, [place] conversations ongoing with PCCM, 1/23 family meeting noted - cont current level of care but no compressions; ongoing discussions  Cont 4K bath  Cont to hold Tac   Subjective:   Remains intubated and sedated.  Tolerating CRRT well but did clot filter overnight after 30h.  Not much clinical improvement.   Objective:   BP (!) 97/40   Pulse 74   Temp (!) 96 F (35.6 C) (Axillary)   Resp (!) 28   Ht 5' 11"  (1.803 m)   Wt 81.3 kg   SpO2 98%   BMI 25.00 kg/m   Intake/Output Summary (Last 24 hours) at 10/13/2020 0746 Last data filed at 10/13/2020 0700 Gross per 24 hour  Intake 4581.86 ml  Output 1418 ml  Net 3163.86 ml   Weight change: 5.4 kg  Physical Exam: Gen: Critically ill looking male intubated and sedated  CVS:reg rate Resp:intubated Abd:nd Ext: minor edema Neuro: sedated, unresponsive Access: rij temp line c/d/i  Imaging: CT HEAD WO CONTRAST  Result Date: 10/11/2020 CLINICAL DATA:  Mental status change, unknown cause EXAM: CT HEAD WITHOUT CONTRAST TECHNIQUE: Contiguous axial images were obtained from the base of the skull through the vertex without intravenous contrast. COMPARISON:  10/03/2020 and  prior. FINDINGS: Brain: No intracranial hemorrhage. Redemonstration of small prior infarcts involving the right parietal and bilateral occipital regions. Additional smaller bilateral cerebral/cerebellar and left basal ganglia insults are not well demonstrated. No new focal hypodensity. No mass lesion. No midline shift, ventriculomegaly or extra-axial fluid collection. Mild cerebral atrophy with ex vacuo dilatation. Vascular: No hyperdense vessel or unexpected calcification. Bilateral skull base atherosclerotic calcifications. Skull: Negative for fracture or focal lesion. Sinuses/Orbits: Decreased left periorbital and right facial edema. Minimal sinus mucosal thickening. Trace bilateral mastoid free fluid. Other: None. IMPRESSION: Redemonstration of prior small right parietal and bilateral occipital infarcts. Additional smaller prior insults involving the cerebrum/cerebellum and left basal ganglia are not well demonstrated. No new focal hypodensity to suggest acute infarct. No intracranial hemorrhage. Electronically Signed   By: Primitivo Gauze M.D.   On: 10/11/2020 11:42    Labs: BMET Recent Labs  Lab 10/10/20 0423 10/10/20 1838 10/11/20 0252 10/11/20 1606 10/12/20 0305 10/12/20 1619 10/12/20 2323 10/13/20 0014 10/13/20 0314 10/13/20 0501  NA 135 133* 136 135 135 133* 131* 134* 134* 135  K 4.5 4.9 4.3 4.9 4.2 5.0 4.5 4.6 4.6 4.3  CL 101 101 103 102 103 101 100  --  102  --   CO2 22 21* 22 21* 20* 20* 19*  --  20*  --   GLUCOSE 168* 215* 129* 186* 88 160* 135*  --  133*  --   BUN 28* 24* 21 21 19 16 15   --  14  --  CREATININE 1.18 1.08 1.09 1.09 1.12 1.00 0.97  --  0.96  --   CALCIUM 7.1* 7.0* 7.0* 6.9* 6.9* 7.0* 6.7*  --  7.0*  --   PHOS 2.9 3.1 2.8 2.9 2.2* 4.3  --   --  2.8  --    CBC Recent Labs  Lab 10/10/20 0423 10/11/20 0252 10/12/20 0305 10/12/20 0837 10/13/20 0014 10/13/20 0314 10/13/20 0501  WBC 31.9* 28.1* 17.4*  --   --  18.2*  --   HGB 7.9* 7.2* 6.7* 7.4* 7.8*  7.6* 8.2*  HCT 24.7* 24.4* 21.7* 23.4* 23.0* 26.3* 24.0*  MCV 91.1 94.6 94.8  --   --  96.3  --   PLT 47* 34* 26*  --   --  21*  --     Medications:    . sodium chloride   Intravenous Once  . aspirin  81 mg Per Tube Daily  . atorvastatin  10 mg Per Tube Daily  . chlorhexidine gluconate (MEDLINE KIT)  15 mL Mouth Rinse BID  . Chlorhexidine Gluconate Cloth  6 each Topical Daily  . famotidine  20 mg Per Tube Daily  . feeding supplement (PROSource TF)  45 mL Per Tube QID  . insulin aspart  0-20 Units Subcutaneous Q4H  . latanoprost  1 drop Both Eyes QHS  . mouth rinse  15 mL Mouth Rinse 10 times per day  . mycophenolate  500 mg Per Tube BID  . predniSONE  5 mg Per Tube q AM  . sodium chloride flush  10 mL Intracatheter Q8H  . sodium chloride flush  10-40 mL Intracatheter Q12H   Justin Mend, MD  10/13/2020, 7:46 AM

## 2020-10-13 NOTE — Progress Notes (Addendum)
[place] Progress Note Patient Name: [name] DOB: December 26, 1945 MRN: [id number]   Date of Service  10/13/2020  HPI/Events of Note  Multiple issues: 1. ABG on 60%/PC 20 /Rate 24 /P 8 = 7.308/39.8/92 and 2. Hypocalcemia - ionized Ca++ = 1.03. K+ = 4.5.  eICU Interventions  Plan: 1. Replace Ca++.  2. Increase rate to 28.  3. Repeat ABG at 5 AM.      Intervention Category Major Interventions: Other:  Lysle Dingwall 10/13/2020, 12:33 AM

## 2020-10-13 NOTE — Consult Note (Signed)
[facility]  Total days of antibiotics 12/ day 7 cefepime               Reason for Consult: PsA pneumonia    Referring Physician: icard  Active Problems:   Cardiac arrest ([place])   Acute respiratory failure with hypoxia ([place])   Shock ([place])   Pneumothorax, traumatic   Primary spontaneous pneumothorax   Chest tube in place    HPI: [name] is a 75 y.o. male with history of renal transplant with IS of tacro 16m BID, cellcept 5065mBID, and pred 5 daily, CKD stage 4, CHF with EF X, admitted for PEAD arrest s/p 12 min resuscitation, in hypoxic respiratory failure, bilateral pleural effusion, and toxic metabolic encephalopathy. Remains intubated, has bilateral chest tube, left pleural fluid found to have PsA. Requiring vasopressors to maintain pressures however still LA 6. While antibiotics and chest tubes, his wbc is tredning down. His abtx have been narrowed to cefepime due to PsA isolated from left pleural fluid cultures.   Allergies:  Allergies  Allergen Reactions  . Amitriptyline     Hallucinations     MEDICATIONS: . atorvastatin  10 mg Per Tube Daily  . chlorhexidine gluconate (MEDLINE KIT)  15 mL Mouth Rinse BID  . Chlorhexidine Gluconate Cloth  6 each Topical Daily  . famotidine  20 mg Per Tube Daily  . feeding supplement (PROSource TF)  45 mL Per Tube QID  . hydrocortisone sod succinate (SOLU-CORTEF) inj  50 mg Intravenous Q6H  . insulin aspart  0-20 Units Subcutaneous Q4H  . latanoprost  1 drop Both Eyes QHS  . mouth rinse  15 mL Mouth Rinse 10 times per day  . mupirocin cream   Topical BID  . sodium chloride flush  10 mL Intracatheter Q8H  . sodium chloride flush  10-40 mL Intracatheter Q12H       No family history on file.  Review of Systems -  Unable to obtain since sedated  OBJECTIVE: Temp:  [94.5 F (34.7 C)-97.6 F (36.4 C)] 97.2 F (36.2 C) (01/24 1130) Pulse Rate:  [67-83] 75 (01/24 1300) Resp:  [22-30] 26 (01/24  1300) BP: (84-143)/(31-122) 125/31 (01/24 1300) SpO2:  [92 %-100 %] 100 % (01/24 1300) FiO2 (%):  [60 %] 60 % (01/24 1143) Weight:  [81.3 kg] 81.3 kg (01/24 0304) Physical Exam  Constitutional: He is sedated and intubated.He appears chronic ill and well-nourished. No distress.  HENT:  Mouth/Throat: Oropharynx is clear and moist. No oropharyngeal exudate.  Cardiovascular: Normal rate, regular rhythm and normal heart sounds. Exam reveals no gallop and no friction rub.  No murmur heard.  Pulmonary/Chest: Effort normal and breath sounds normal. No respiratory distress. He has no wheezes. Bilateral chest tube= hemorrhagic fluid and right serous fluid Abdominal: Soft. Bowel sounds are decreased. He exhibits no distension. There is no tenderness.  Skin: Skin is warm and dry. Mottled left foot, cyanotic  LABS: Results for orders placed or performed during the hospital encounter of 09/22/2020 (from the past 48 hour(s))  Renal function panel (daily at 1600)     Status: Abnormal   Collection Time: 10/11/20  4:06 PM  Result Value Ref Range   Sodium 135 135 - 145 mmol/L   Potassium 4.9 3.5 - 5.1 mmol/L   Chloride 102 98 - 111 mmol/L   CO2 21 (L) 22 - 32 mmol/L   Glucose, Bld 186 (H) 70 - 99 mg/dL    Comment: Glucose reference range  applies only to samples taken after fasting for at least 8 hours.   BUN 21 8 - 23 mg/dL   Creatinine, Ser 1.09 0.61 - 1.24 mg/dL   Calcium 6.9 (L) 8.9 - 10.3 mg/dL   Phosphorus 2.9 2.5 - 4.6 mg/dL   Albumin 1.9 (L) 3.5 - 5.0 g/dL   GFR, Estimated >60 >60 mL/min    Comment: (NOTE) Calculated using the CKD-EPI Creatinine Equation (2021)    Anion gap 12 5 - 15    Comment: Performed at [facility] [address] [place], [place] [address]  Glucose, capillary     Status: Abnormal   Collection Time: 10/11/20  4:18 PM  Result Value Ref Range   Glucose-Capillary 182 (H) 70 - 99 mg/dL    Comment: Glucose reference range applies only to samples taken after fasting  for at least 8 hours.  Glucose, capillary     Status: Abnormal   Collection Time: 10/11/20  7:38 PM  Result Value Ref Range   Glucose-Capillary 159 (H) 70 - 99 mg/dL    Comment: Glucose reference range applies only to samples taken after fasting for at least 8 hours.  Glucose, capillary     Status: Abnormal   Collection Time: 10/11/20 11:16 PM  Result Value Ref Range   Glucose-Capillary 142 (H) 70 - 99 mg/dL    Comment: Glucose reference range applies only to samples taken after fasting for at least 8 hours.  Renal function panel (daily at 0500)     Status: Abnormal   Collection Time: 10/12/20  3:05 AM  Result Value Ref Range   Sodium 135 135 - 145 mmol/L   Potassium 4.2 3.5 - 5.1 mmol/L   Chloride 103 98 - 111 mmol/L   CO2 20 (L) 22 - 32 mmol/L   Glucose, Bld 88 70 - 99 mg/dL    Comment: Glucose reference range applies only to samples taken after fasting for at least 8 hours.   BUN 19 8 - 23 mg/dL   Creatinine, Ser 1.12 0.61 - 1.24 mg/dL   Calcium 6.9 (L) 8.9 - 10.3 mg/dL   Phosphorus 2.2 (L) 2.5 - 4.6 mg/dL   Albumin 1.7 (L) 3.5 - 5.0 g/dL   GFR, Estimated >60 >60 mL/min    Comment: (NOTE) Calculated using the CKD-EPI Creatinine Equation (2021)    Anion gap 12 5 - 15    Comment: Performed at [facility] [address] [place] [address]  CBC     Status: Abnormal   Collection Time: 10/12/20  3:05 AM  Result Value Ref Range   WBC 17.4 (H) 4.0 - 10.5 K/uL   RBC 2.29 (L) 4.22 - 5.81 MIL/uL   Hemoglobin 6.7 (LL) 13.0 - 17.0 g/dL    Comment: REPEATED TO VERIFY THIS CRITICAL RESULT HAS VERIFIED AND BEEN CALLED TO R.NAZA,RN BY MELISSA BROGDON ON 01 23 2022 AT 0330, AND HAS BEEN READ BACK.     HCT 21.7 (L) 39.0 - 52.0 %   MCV 94.8 80.0 - 100.0 fL   MCH 29.3 26.0 - 34.0 pg   MCHC 30.9 30.0 - 36.0 g/dL   RDW 18.9 (H) 11.5 - 15.5 %   Platelets 26 (LL) 150 - 400 K/uL    Comment: REPEATED TO VERIFY PLATELET COUNT CONFIRMED BY SMEAR SPECIMEN CHECKED FOR  CLOTS Immature Platelet Fraction may be clinically indicated, consider ordering this additional test [id number] CRITICAL RESULT CALLED TO, READ BACK BY AND VERIFIED WITH: R.NAZA,RN 0344  10/12/2020 M.CAMPBELL    nRBC 0.3 (H) 0.0 - 0.2 %    Comment: Performed at [facility], [place] [address] [place], [place] [address]  Glucose, capillary     Status: None   Collection Time: 10/12/20  3:08 AM  Result Value Ref Range   Glucose-Capillary 88 70 - 99 mg/dL    Comment: Glucose reference range applies only to samples taken after fasting for at least 8 hours.  Glucose, capillary     Status: Abnormal   Collection Time: 10/12/20  3:45 AM  Result Value Ref Range   Glucose-Capillary 100 (H) 70 - 99 mg/dL    Comment: Glucose reference range applies only to samples taken after fasting for at least 8 hours.  Prepare RBC (crossmatch)     Status: None   Collection Time: 10/12/20  3:54 AM  Result Value Ref Range   Order Confirmation      ORDER PROCESSED BY BLOOD BANK Performed at [facility], [place] [address], [place], [place] [address]   Glucose, capillary     Status: None   Collection Time: 10/12/20  7:55 AM  Result Value Ref Range   Glucose-Capillary 74 70 - 99 mg/dL    Comment: Glucose reference range applies only to samples taken after fasting for at least 8 hours.  Hemoglobin and hematocrit, blood     Status: Abnormal   Collection Time: 10/12/20  8:37 AM  Result Value Ref Range   Hemoglobin 7.4 (L) 13.0 - 17.0 g/dL   HCT 23.4 (L) 39.0 - 52.0 %    Comment: Performed at [facility] [address] [place], [place] [address]  Glucose, capillary     Status: Abnormal   Collection Time: 10/12/20 11:19 AM  Result Value Ref Range   Glucose-Capillary 111 (H) 70 - 99 mg/dL    Comment: Glucose reference range applies only to samples taken after fasting for at least 8 hours.  Lactic acid, plasma     Status: Abnormal   Collection Time: 10/12/20  1:21 PM  Result Value Ref Range    Lactic Acid, Venous 6.2 (HH) 0.5 - 1.9 mmol/L    Comment: CRITICAL RESULT CALLED TO, READ BACK BY AND VERIFIED WITH: Raul Del RN [phone] BY A BENNETT Performed at [facility], [place] [address] [place], [place] [address]   Glucose, capillary     Status: Abnormal   Collection Time: 10/12/20  4:18 PM  Result Value Ref Range   Glucose-Capillary 144 (H) 70 - 99 mg/dL    Comment: Glucose reference range applies only to samples taken after fasting for at least 8 hours.  Renal function panel (daily at 1600)     Status: Abnormal   Collection Time: 10/12/20  4:19 PM  Result Value Ref Range   Sodium 133 (L) 135 - 145 mmol/L   Potassium 5.0 3.5 - 5.1 mmol/L   Chloride 101 98 - 111 mmol/L   CO2 20 (L) 22 - 32 mmol/L   Glucose, Bld 160 (H) 70 - 99 mg/dL    Comment: Glucose reference range applies only to samples taken after fasting for at least 8 hours.   BUN 16 8 - 23 mg/dL   Creatinine, Ser 1.00 0.61 - 1.24 mg/dL   Calcium 7.0 (L) 8.9 - 10.3 mg/dL   Phosphorus 4.3 2.5 - 4.6 mg/dL   Albumin 1.7 (L) 3.5 - 5.0 g/dL   GFR, Estimated >60 >60 mL/min    Comment: (  NOTE) Calculated using the CKD-EPI Creatinine Equation (2021)    Anion gap 12 5 - 15    Comment: Performed at [facility], [place] [address], [place], [place] [address]  Lactic acid, plasma     Status: Abnormal   Collection Time: 10/12/20  4:19 PM  Result Value Ref Range   Lactic Acid, Venous 6.3 (HH) 0.5 - 1.9 mmol/L    Comment: CRITICAL VALUE NOTED.  VALUE IS CONSISTENT WITH PREVIOUSLY REPORTED AND CALLED VALUE. Performed at [facility], [place] [address], [place], [place] [address]   Glucose, capillary     Status: Abnormal   Collection Time: 10/12/20  7:33 PM  Result Value Ref Range   Glucose-Capillary 143 (H) 70 - 99 mg/dL    Comment: Glucose reference range applies only to samples taken after fasting for at least 8 hours.  Basic metabolic panel     Status: Abnormal   Collection Time: 10/12/20 11:23 PM   Result Value Ref Range   Sodium 131 (L) 135 - 145 mmol/L   Potassium 4.5 3.5 - 5.1 mmol/L   Chloride 100 98 - 111 mmol/L   CO2 19 (L) 22 - 32 mmol/L   Glucose, Bld 135 (H) 70 - 99 mg/dL    Comment: Glucose reference range applies only to samples taken after fasting for at least 8 hours.   BUN 15 8 - 23 mg/dL   Creatinine, Ser 0.97 0.61 - 1.24 mg/dL   Calcium 6.7 (L) 8.9 - 10.3 mg/dL   GFR, Estimated >60 >60 mL/min    Comment: (NOTE) Calculated using the CKD-EPI Creatinine Equation (2021)    Anion gap 12 5 - 15    Comment: Performed at [facility] [address], [place], [place] [address]  Glucose, capillary     Status: Abnormal   Collection Time: 10/12/20 11:27 PM  Result Value Ref Range   Glucose-Capillary 125 (H) 70 - 99 mg/dL    Comment: Glucose reference range applies only to samples taken after fasting for at least 8 hours.  I-STAT 7, (LYTES, BLD GAS, ICA, H+H)     Status: Abnormal   Collection Time: 10/13/20 12:14 AM  Result Value Ref Range   pH, Arterial 7.308 (L) 7.350 - 7.450   pCO2 arterial 39.8 32.0 - 48.0 mmHg   pO2, Arterial 92 83.0 - 108.0 mmHg   Bicarbonate 20.3 20.0 - 28.0 mmol/L   TCO2 22 22 - 32 mmol/L   O2 Saturation 97.0 %   Acid-base deficit 6.0 (H) 0.0 - 2.0 mmol/L   Sodium 134 (L) 135 - 145 mmol/L   Potassium 4.6 3.5 - 5.1 mmol/L   Calcium, Ion 1.03 (L) 1.15 - 1.40 mmol/L   HCT 23.0 (L) 39.0 - 52.0 %   Hemoglobin 7.8 (L) 13.0 - 17.0 g/dL   Patient temperature 96.1 F    Collection site Brachial    Drawn by RT    Sample type ARTERIAL   Renal function panel (daily at 0500)     Status: Abnormal   Collection Time: 10/13/20  3:14 AM  Result Value Ref Range   Sodium 134 (L) 135 - 145 mmol/L   Potassium 4.6 3.5 - 5.1 mmol/L   Chloride 102 98 - 111 mmol/L   CO2 20 (L) 22 - 32 mmol/L   Glucose, Bld 133 (H) 70 - 99 mg/dL    Comment: Glucose reference range applies only to samples taken after fasting for at least 8 hours.  BUN 14 8 - 23 mg/dL    Creatinine, Ser 0.96 0.61 - 1.24 mg/dL   Calcium 7.0 (L) 8.9 - 10.3 mg/dL   Phosphorus 2.8 2.5 - 4.6 mg/dL   Albumin 1.6 (L) 3.5 - 5.0 g/dL   GFR, Estimated >60 >60 mL/min    Comment: (NOTE) Calculated using the CKD-EPI Creatinine Equation (2021)    Anion gap 12 5 - 15    Comment: Performed at [facility] [address] [place], [place] [address]  CBC     Status: Abnormal   Collection Time: 10/13/20  3:14 AM  Result Value Ref Range   WBC 18.2 (H) 4.0 - 10.5 K/uL   RBC 2.73 (L) 4.22 - 5.81 MIL/uL   Hemoglobin 7.6 (L) 13.0 - 17.0 g/dL   HCT 26.3 (L) 39.0 - 52.0 %   MCV 96.3 80.0 - 100.0 fL   MCH 27.8 26.0 - 34.0 pg   MCHC 28.9 (L) 30.0 - 36.0 g/dL   RDW 19.2 (H) 11.5 - 15.5 %   Platelets 21 (LL) 150 - 400 K/uL    Comment: REPEATED TO VERIFY Immature Platelet Fraction may be clinically indicated, consider ordering this additional test [id number] CRITICAL VALUE NOTED.  VALUE IS CONSISTENT WITH PREVIOUSLY REPORTED AND CALLED VALUE.    nRBC 0.4 (H) 0.0 - 0.2 %    Comment: Performed at [facility], [place] [address], [place], [place] [address]  Glucose, capillary     Status: Abnormal   Collection Time: 10/13/20  3:20 AM  Result Value Ref Range   Glucose-Capillary 131 (H) 70 - 99 mg/dL    Comment: Glucose reference range applies only to samples taken after fasting for at least 8 hours.  I-STAT 7, (LYTES, BLD GAS, ICA, H+H)     Status: Abnormal   Collection Time: 10/13/20  5:01 AM  Result Value Ref Range   pH, Arterial 7.297 (L) 7.350 - 7.450   pCO2 arterial 39.8 32.0 - 48.0 mmHg   pO2, Arterial 71 (L) 83.0 - 108.0 mmHg   Bicarbonate 19.5 (L) 20.0 - 28.0 mmol/L   TCO2 21 (L) 22 - 32 mmol/L   O2 Saturation 92.0 %   Acid-base deficit 7.0 (H) 0.0 - 2.0 mmol/L   Sodium 135 135 - 145 mmol/L   Potassium 4.3 3.5 - 5.1 mmol/L   Calcium, Ion 1.03 (L) 1.15 - 1.40 mmol/L   HCT 24.0 (L) 39.0 - 52.0 %   Hemoglobin 8.2 (L) 13.0 - 17.0 g/dL   Patient temperature 98.6 F     Collection site Radial    Drawn by RT    Sample type ARTERIAL   Glucose, capillary     Status: None   Collection Time: 10/13/20  7:51 AM  Result Value Ref Range   Glucose-Capillary 91 70 - 99 mg/dL    Comment: Glucose reference range applies only to samples taken after fasting for at least 8 hours.  Lactic acid, plasma     Status: Abnormal   Collection Time: 10/13/20 11:54 AM  Result Value Ref Range   Lactic Acid, Venous 6.4 (HH) 0.5 - 1.9 mmol/L    Comment: CRITICAL VALUE NOTED.  VALUE IS CONSISTENT WITH PREVIOUSLY REPORTED AND CALLED VALUE. Performed at [facility], [place] [address] [place], [place] [address]   DIC Panel (Not at [facility]) ONCE - STAT     Status: Abnormal   Collection Time: 10/13/20 11:54 AM  Result Value Ref Range   Prothrombin Time  18.7 (H) 11.4 - 15.2 seconds   INR 1.6 (H) 0.8 - 1.2    Comment: (NOTE) INR goal varies based on device and disease states.    aPTT 47 (H) 24 - 36 seconds    Comment:        IF BASELINE aPTT IS ELEVATED, SUGGEST PATIENT RISK ASSESSMENT BE USED TO DETERMINE APPROPRIATE ANTICOAGULANT THERAPY.    Fibrinogen 543 (H) 210 - 475 mg/dL   D-Dimer, Quant 3.13 (H) 0.00 - 0.50 ug/mL-FEU    Comment: (NOTE) At the manufacturer cut-off value of 0.5 g/mL FEU, this assay has a negative predictive value of 95-100%.This assay is intended for use in conjunction with a clinical pretest probability (PTP) assessment model to exclude pulmonary embolism (PE) and deep venous thrombosis (DVT) in outpatients suspected of PE or DVT. Results should be correlated with clinical presentation.    Platelets 19 (LL) 150 - 400 K/uL    Comment: REPEATED TO VERIFY PLATELET COUNT CONFIRMED BY SMEAR Immature Platelet Fraction may be clinically indicated, consider ordering this additional test [id number] CONSISTENT WITH PREVIOUS RESULT    Smear Review NO SCHISTOCYTES SEEN     Comment: Performed at [facility] [address] [place], [place] [address]   Glucose, capillary     Status: None   Collection Time: 10/13/20 11:55 AM  Result Value Ref Range   Glucose-Capillary 91 70 - 99 mg/dL    Comment: Glucose reference range applies only to samples taken after fasting for at least 8 hours.    MICRO: 1/22 left pleural fluid = pseudomonas (pan sensitive) but also has GPC (still pending) and mixed anearobic flora  IMAGING: Recent cxr stil has patchy infiltrate with L > R effusion. 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 Currently on cellcept and prednisone who remains critically ill with Respiratory distress requiring ventilator support s/p chest tube bilaterally for left empyema and bilateral pneumothoraces and vasopressors, on CRRT,   = continue with cefepime, recommend addition of metronidazole 527m VI Q8hr for further coverage = will continue to follow pleurla fluid cultures  Respiratory distress= continues on PCV mode per pulm critical care  Septic shock = still requiring dual pressors, fluid boluses to adjust with elevated LA. Has evidence of peripheral ischemia to left foot, continue to follow  S/p renal transplant = continue to hold tacrolimus due to CNS toxicity. Other IS also on hold except for stress dose steroids  aki with CRRT= will dose adjust cefepime.  Overall critically ill, prognosis is guarded

## 2020-10-13 NOTE — Progress Notes (Addendum)
NAME:  [name], MRN:  [id number], DOB:  [birth year], LOS: 12 ADMISSION DATE:  09/29/2020, CONSULTATION DATE:  1/12 REFERRING MD:  Dr. Rogene Houston, CHIEF COMPLAINT:  Cardiac arrest   Brief History:  75 year old male with renal transplant history admitted 1/12 after PEA arrest 12 minutes.  PMH significant for CKD (previously on HD, but is s/p renal transplant in 2005 at [facility]), AF not on [facility], who presented to [facility] ED after cardiac arrest athome 1/12. Apparently 1/11 in the evening hours he developed chest pain and dyspnea, but proceeded to go to bed. Then 1/12 AM his wife awoke around 0930 and found him to be unresponsive and foaming at the mouth. He was still breathing. As she called EMS he fell to the floor. Upon EMS arrival the patient was pulseless and ACLS was initiated. CPR was performed for approximately 12 minutes with two doses of epinephrine. He suffered injury to his L periorbit at some point during all this, raising concern for head injury and he was treated as a level one trauma in ED. Upon arrival he was unresponsive and was immediately intubated. He was emergently taken for pan-CT scan. No operable injury was identified. PCCM asked to admit.   Past Medical History:   Paroxysmal AF (no AC)  ESRD S/p Renal transplant x2  HFrEF  HTN  HLD  T2DM  PAD  Significant Hospital Events:  1/12 admit for PEA arrest 1/13 Pneumothorax development with pigtail catheter placed. Patient had increasing pressor requirements.  1/14 CRRT initiated 1/18 Change chest tube to water seal 1/19 New Lt pneumothorax; changed to no CPR/defibrillation 1/20 Transfuse PRBC 1/23 Last 48 hours max pressor support. CRRT fluid removal was paused, net positive.  Family meeting held. 1/24 Worsening acidosis despite HD, not pulling fluid on HD  Consults:  Neurosurgery (S/o) Trauma surgery (S/o) Nephrology Neurology  Palliative care  Procedures:  CVL R subclavian 1/12 >> ETT 1/12 >> Rt  pigtail catheter 1/14 >> R IJ HD cath 1/14 >>  Lt pigtail catheter 1/19 >>   Significant Diagnostic Tests:  1/12 CTA head > no large vessel occlusion. Mod-severe stenosis of the dominant R vert.  1/12 CT Maxillofacial > Subtle fracture of the posterior wall of the right maxillary sinus is new since September and likely acute. Trace fluid within the sinus. Hematoma/contusion involving the right parotid gland and regional subcutaneous tissues. Left forehead and periorbital hematoma without underlying fracture. 1/12 CT chest/abd/pelv > Numerous acute rib fractures: Right ribs 1 through 7 (with rib 2 through 4 flail segment) and left ribs 2 through 7. Mild intercostal hematoma, but no pneumothorax, hemothorax or pulmonary contusion. Low-density layering pleural effusions appear mildly increased from last month. Chronic atelectasis. MRI Brain 1/14 >> scattered small acute to early subacute cerebral and cerebellar infarcts, few scattered chronic cerebral microhemorrhages bilaterally, small amount of petechial blood associated with small left occipital infarct, possible more global hypoxic ischemic injury given subtle diffusion-weighted signal hyperintensity involving cortex posteriorly CT Head wo Contrast 1/22 Redemonstration prior small right parietal and bilateral occipital infarcts. No acute infarct or hemorrhage  Micro Data:  1/12 COVID/Flu >> negative 1/12 Urine Cultures > Negative 1/13 Blood Cultures > negative 1/19 Sputum >> Pseudomonas pansensitive 1/19 Blood >> 1/2 with staph epidermis >> 1/19 urine >> negative 1/22 L Pleural culture >> abundant pseudomonas >>   Antimicrobials:  Ceftriaxone 1/13 >> 1/14; 1/16 >> 1/18  Cefepime 1/14 >> 1/16 Cefepime 1/18 >> Linezolid 1/22 >> 1/23  Interim History /  Subjective:  Hypothermic despite bair hugger - 94.5 / WBC 18.2 Glucose range 91-133 on D10. RN notes vomiting ~ two days ago, not on TF I/O 1.1L removed with HD, +3.1L in last 24 hours, 260  ml out of chest tube in last 24 hours Remains on max dose pressors   Objective   Blood pressure (!) 97/47, pulse 75, temperature (S) (!) 94.5 F (34.7 C), temperature source Axillary, resp. rate (!) 28, height 5\' 11"  (1.803 m), weight 81.3 kg, SpO2 100 %.    Vent Mode: PCV FiO2 (%):  [60 %] 60 % Set Rate:  [24 bmp-28 bmp] 28 bmp PEEP:  [8 cmH20] 8 cmH20 Plateau Pressure:  [24 cmH20-28 cmH20] 27 cmH20   Intake/Output Summary (Last 24 hours) at 10/13/2020 8413 Last data filed at 10/13/2020 0800 Gross per 24 hour  Intake 4006.45 ml  Output 1473 ml  Net 2533.45 ml   Filed Weights   10/11/20 0152 10/12/20 0117 10/13/20 0304  Weight: 76.4 kg 75.9 kg 81.3 kg   Physical Exam: General: critically ill appearing adult male lying in bed in NAD HEENT: MM pink/moist, ETT, left eye / forehead laceration, left eye with blood in sclera, mild icterus / jaundice Neuro: on fentanyl gtt, no response to voice, does turn head side to side CV: s1s2 irr irr, AF on monitor, no m/r/g PULM: acidosis / agonal type respirations, MV 15, lungs bilaterally coarse with rhonchi, bilateral chest tubes, left with grey-yellow fluid draining GI: soft, bsx4 active  Extremities: warm/dry, no edema, changes of LE's consistent with chronic venous stasis, dusky toes L>R  Skin: no rashes or lesions  Resolved Hospital Problem list     Assessment & Plan:   Acute anoxic encephalopathy in setting of PEA cardiac arrest and critical illness Bilateral acute subcortical infarcts (possibly embolic) -appreciate Neurology evaluation  -hold ASA 1/24 with platelets ~ 20, no acute bleeding  -limit sedating medications as able, fentanyl gtt  -RASS goal 0 to -1   Acute hypoxic respiratory failure with compromised airway 2nd to PEA arrest, acute pulmonary edema and aspiration pneumonia. Rt pneumothorax after CPR. Spontaneous Lt pneumothorax on 1/19. Pseudomonas pneumonia complicated by left empyema -PCV as rest mode, rate  adjusted 1/24 am -not a candidate for weaning due to hemodynamic instability / neurologic status  -bilateral chest tubes to -20cm suction -follow chest tube culture -wean PEEP / fiO2 for sats >90%  Septic shock secondary to Pseudomonas pneumonia complicated by empyema 1 of 4 cultures with S. Epidermis, likely contaminant Dusky Toes / Pressor Dependent -D12/x abx, continue cefepime  -follow sensitivities  -change vasopressor goal to SBP >90 -PRN fluid bolus for hypotension / allow positive balance -family requested ID consultation, ID called per request  -repeat lactic acid -follow peripheral exam  AKI in setting of shock from PEA arrest. CKD s/p renal transplant. -Appreciate Nephrology  -continue CVVHD, not pulling volume  -hold cellcept, prednisone  -add stress dose steroids in lieu of above  PEA arrest PAF, chronic combined CHF. -telemetry  -hold anitcoagulation due to thrombocytopenia   DM type 2 poorly controlled with hyperglycemia. Having episodes of hypoglycemia on 1/20. -SSI  -continue D10  -re-challenge with TF at 20 ml/hr, no titration for now   R, L rib fx: flail segments by CT on the right. R maxillary sinus wall fracture. -pain control with fentanyl   Anemia of critical illness and chronic disease. Thrombocytopenia. S/p PRBC 1/19 -hold ASA 1/24 with platelets  -transfuse for Hgb <7% or active bleeding  -  follow up HIT panel, pending  -assess DIC panel   Goals of care. -Palliative care following -Prognosis for meaningful recovery seems grim -Family meeting with Neurology and Critical Care on 1/23. After discussion regarding goals of care, family has expressed that they do not "want to give up" or "have any regrets" as they have seen Mr. Zwart fight through prior illnesses. They express full understanding how critically ill he is and wish to continue current care. They do not want him to have chest compressions if he were to naturally pass but would like  to continue to receive aggressive care. They are unsure if they would pursue tracheostomy but prefer to discuss it if/when he survives to that point. -Code status remains DNR  Best practice (evaluated daily)  Diet: Tube Feeds  DVT prophylaxis: SQH GI prophylaxis: Pepcid Mobility: BR Disposition: FULL Goals of care: full code Family: Wife, [name], called for update 1/24.  Daughters with mother and updated as well.   Labs    CMP Latest Ref Rng & Units 10/13/2020 10/13/2020 10/13/2020  Glucose 70 - 99 mg/dL - 133(H) -  BUN 8 - 23 mg/dL - 14 -  Creatinine 0.61 - 1.24 mg/dL - 0.96 -  Sodium 135 - 145 mmol/L 135 134(L) 134(L)  Potassium 3.5 - 5.1 mmol/L 4.3 4.6 4.6  Chloride 98 - 111 mmol/L - 102 -  CO2 22 - 32 mmol/L - 20(L) -  Calcium 8.9 - 10.3 mg/dL - 7.0(L) -  Total Protein 6.5 - 8.1 g/dL - - -  Total Bilirubin 0.3 - 1.2 mg/dL - - -  Alkaline Phos 38 - 126 U/L - - -  AST 15 - 41 U/L - - -  ALT 0 - 44 U/L - - -    CBC Latest Ref Rng & Units 10/13/2020 10/13/2020 10/13/2020  WBC 4.0 - 10.5 K/uL - 18.2(H) -  Hemoglobin 13.0 - 17.0 g/dL 8.2(L) 7.6(L) 7.8(L)  Hematocrit 39.0 - 52.0 % 24.0(L) 26.3(L) 23.0(L)  Platelets 150 - 400 K/uL - 21(LL) -    ABG    Component Value Date/Time   PHART 7.297 (L) 10/13/2020 0501   PCO2ART 39.8 10/13/2020 0501   PO2ART 71 (L) 10/13/2020 0501   HCO3 19.5 (L) 10/13/2020 0501   TCO2 21 (L) 10/13/2020 0501   ACIDBASEDEF 7.0 (H) 10/13/2020 0501   O2SAT 92.0 10/13/2020 0501    CBG (last 3)  Recent Labs    10/12/20 2327 10/13/20 0320 10/13/20 0751  GLUCAP 125* 131* 91    Critical care time: 36 minutes   Noe Gens, MSN, NP-C, AGACNP-BC [facility] 10/13/2020, 9:41 AM   Please see [email or web address] for pager details.    PCCM:  75 yo transplant patient, GNR bacteremia, septic shock from pseudomonal empyema, PEA cardiac arrest, PTX, on vent, multiorgan failure on cvvhd and high dose pressors with climbing lactic  acid  BP (!) 103/37   Pulse 78   Temp (!) 96.3 F (35.7 C) (Axillary)   Resp (!) 29   Ht 5\' 11"  (1.803 m)   Wt 81.3 kg   SpO2 100%   BMI 25.00 kg/m   Gen: elderly frail gentleman, intuabted on life support  HENT: ETT in place  HEART: tachy regular, s1 s2 Lungs: BL vented breaths  Abd: distended   Labs: reviewed   A:  PEA Arrest  Likely anoxic brain injury  Multiorgan failure Shock, multifactorial  Septic shock from pseudomonas  Left empyema  Secondary left PTX  Bilateral  strokes  PAF Facial trauma s/p fall, PTA  Anemia of critical illness   P: Pressors to maintain MAP >40mmHg  Remains on CVVHD but cannot pull I suspect he will pass connected to full support  He is maxed on two vasopressors already  We will continue to support him the best we can Added stress dose hydrocortisone  I suspect he will pass during this hospitalization  His LA is climbing. He may have bowel ischemia but there is nothing that can be do about it at this time. He is too unstable  Remains partial code, which I agree with   This patient is critically ill with multiple organ system failure; which, requires frequent high complexity decision making, assessment, support, evaluation, and titration of therapies. This was completed through the application of advanced monitoring technologies and extensive interpretation of multiple databases. During this encounter critical care time was devoted to patient care services described in this note for 33 minutes.  Garner Nash, DO [place] [facility] 10/13/2020 5:37 PM

## 2020-10-13 NOTE — Progress Notes (Signed)
Assisted tele visit to patient with family member.  Sir Mallis Ann, RN  

## 2020-10-13 NOTE — Progress Notes (Signed)
Assisted tele visit to patient with family member.  Demitra Danley D Shawntee Mainwaring, RN   

## 2020-10-13 NOTE — Consult Note (Signed)
WOC Nurse Consult Note: Patient receiving care in [facility] 2M13 Reason for Consult: Wound on penis and sacrum Wound type: Full thickness wound to the penis MASD/IAD to the sacrum Pressure Injury POA: Yes Measurement: See flowsheet Wound bed: Sacral wound is pink moisture associated. Penile wound is anterior crusted with no drainage.  Dressing procedure/placement/frequency: Apply Bactroban to penis BID and PRN soiling Apply sacral foam to the sacrum. Change every 3 days or PRN soiling.   Monitor the wound area(s) for worsening of condition such as: Signs/symptoms of infection, increase in size, development of or worsening of odor, development of pain, or increased pain at the affected locations.   Notify the medical team if any of these develop.  Thank you for the consult. [place] nurse will not follow at this time.   Please re-consult the [place] team if needed.  Cathlean Marseilles Tamala Julian, MSN, RN, [place], Lysle Pearl, [facility] Wound Treatment Associate Pager [phone]

## 2020-10-13 NOTE — Progress Notes (Signed)
Nutrition Follow-up  RD working remotely.  DOCUMENTATION CODES:   Not applicable  INTERVENTION:   Per CCM, resume TF at trickle rate via OGT: - Vital AF 1.2 @ 20 ml/hr  - As able, advance tube feeding rate to goal of 75 ml/hr (1800 ml per day) - ProSource TF 45 ml QID  Tube feeding regimen at goal rate provides 2320 kcal, 179 grams of protein, and 1460 ml of H2O.   NUTRITION DIAGNOSIS:   Inadequate oral intake related to inability to eat as evidenced by NPO status.  Ongoing  GOAL:   Patient will meet greater than or equal to 90% of their needs  Unmet, tube feeds restarting today at trickle rate  MONITOR:   Vent status,TF tolerance,Skin,Labs  REASON FOR ASSESSMENT:   Ventilator,Consult Enteral/tube feeding initiation and management  ASSESSMENT:   75 yo male admitted after fall with PEA arrest S/P 12 minutes ACLS. PMH includes 2 renal transplants, CKD stage 4, CHF, HTN.  CRRT started on 1/14 and remains ongoing. Per CCM note, pt with worsening acidosis despite CRRT. Keeping even on CRRT. Pt is hypothermic despite [employer]. Pt remains on max dose pressors.  Goals of care discussions with family are ongoing.  Per CCM, plan is to re-challenge with tube feeds at 20 ml/hr without advancement at this time.  Admit weight: 87.2 Current weight: 81.3 kg  Per RN edema assessment, pt with moderate pitting generalized edema, moderate pitting edema to BUE, moderate to deep pitting edema to BLE, mild pitting edema to facial region, and mild pitting edema to perineal/sacral region.  Patient remains intubated on ventilator support MV: 13.7 L/min Temp (24hrs), Avg:96.1 F (35.6 C), Min:94.5 F (34.7 C), Max:97.6 F (36.4 C) BP (cuff): 127/33 MAP (cuff): 50  Drips: Fentanyl Levophed Vasopressin D10: 50 ml/hr  Medications reviewed and include: pepcid, solu-cortef, SSI q 4 hours, IV abx  Labs reviewed: lactic acid 6.4, ionized calcium 1.03, hemoglobin 8.2, platelets  19 CBG's: 91-144 x 24 hours  CRRT UF: 1158 ml x 24 hours Stool: 0 ml x 24 hours (small type 7 BM documented this AM) CT: 260 ml x 24 hours I/O's: +4.8 L since admit  Diet Order:   Diet Order            Diet NPO time specified  Diet effective now                 EDUCATION NEEDS:   Not appropriate for education at this time  Skin:  Skin Assessment:  Skin Integrity Issues: DTI: sacrum, penis Other: partial thickness degloving injury to arms, abrasion/cut over left eye   Last BM:  10/13/20 small type 7 via rectal tube  Height:   Ht Readings from Last 1 Encounters:  10/18/2020 5\' 11"  (1.803 m)    Weight:   Wt Readings from Last 1 Encounters:  10/13/20 81.3 kg    Ideal Body Weight:  78.2 kg  BMI:  Body mass index is 25 kg/m.  Estimated Nutritional Needs:   Kcal:  2100-2300  Protein:  >/= 169 gm  Fluid:  2 L    Gustavus Bryant, MS, RD, LDN Inpatient Clinical Dietitian Please see AMiON for contact information.

## 2020-10-13 NOTE — Progress Notes (Signed)
[place] Progress Note Patient Name: [name] DOB: 08-07-1946 MRN: [id number]   Date of Service  10/13/2020  HPI/Events of Note  Called d/t Hgb = 7.6 and platelets = 21. Per Dr. Cordelia Pen note yesterday: Transfuse for Hb < 7, PLT < 10K, or significant bleeding.  eICU Interventions  Continue present management.      Intervention Category Major Interventions: Other:  Lysle Dingwall 10/13/2020, 4:37 AM

## 2020-10-13 NOTE — Progress Notes (Signed)
[place] Progress Note Patient Name: [name] DOB: 1946/03/27 MRN: [id number]   Date of Service  10/13/2020  HPI/Events of Note  ABG on 60%/PC 20/Rate 28/P 8 = 7.297/39.8/71. ABG pH not improved by increasing rate on PCV. The pH is acceptable.   eICU Interventions  Continue present management.      Intervention Category Major Interventions: Respiratory failure - evaluation and management  Patrice Matthew Eugene 10/13/2020, 5:25 AM

## 2020-10-13 NOTE — Progress Notes (Signed)
[place] Progress Note Patient Name: [name] DOB: Dec 26, 1945 MRN: [id number]   Date of Service  10/13/2020  HPI/Events of Note  Tube feeds restarted. Blood glucose = 155.   eICU Interventions  Plan: 1. Decrease D10W IV infusion to 25 mL/hour.      Intervention Category Major Interventions: Hyperglycemia - active titration of insulin therapy  Lysle Dingwall 10/13/2020, 10:43 PM

## 2020-10-14 ENCOUNTER — Inpatient Hospital Stay (HOSPITAL_COMMUNITY): Payer: Medicare Other

## 2020-10-14 DIAGNOSIS — Z9689 Presence of other specified functional implants: Secondary | ICD-10-CM | POA: Diagnosis not present

## 2020-10-14 DIAGNOSIS — R0989 Other specified symptoms and signs involving the circulatory and respiratory systems: Secondary | ICD-10-CM | POA: Diagnosis not present

## 2020-10-14 DIAGNOSIS — J9601 Acute respiratory failure with hypoxia: Secondary | ICD-10-CM | POA: Diagnosis not present

## 2020-10-14 DIAGNOSIS — R579 Shock, unspecified: Secondary | ICD-10-CM | POA: Diagnosis not present

## 2020-10-14 DIAGNOSIS — I469 Cardiac arrest, cause unspecified: Secondary | ICD-10-CM | POA: Diagnosis not present

## 2020-10-14 LAB — RENAL FUNCTION PANEL
Albumin: 1.5 g/dL — ABNORMAL LOW (ref 3.5–5.0)
Albumin: 1.5 g/dL — ABNORMAL LOW (ref 3.5–5.0)
Anion gap: 12 (ref 5–15)
Anion gap: 18 — ABNORMAL HIGH (ref 5–15)
BUN: 17 mg/dL (ref 8–23)
BUN: 18 mg/dL (ref 8–23)
CO2: 20 mmol/L — ABNORMAL LOW (ref 22–32)
CO2: 15 mmol/L — ABNORMAL LOW (ref 22–32)
Calcium: 7 mg/dL — ABNORMAL LOW (ref 8.9–10.3)
Calcium: 7 mg/dL — ABNORMAL LOW (ref 8.9–10.3)
Chloride: 103 mmol/L (ref 98–111)
Chloride: 105 mmol/L (ref 98–111)
Creatinine, Ser: 1.05 mg/dL (ref 0.61–1.24)
Creatinine, Ser: 1.16 mg/dL (ref 0.61–1.24)
GFR, Estimated: >60 mL/min (ref >60)
GFR, Estimated: >60 mL/min (ref >60)
Glucose, Bld: 184 mg/dL — ABNORMAL HIGH (ref 70–99)
Glucose, Bld: 53 mg/dL — ABNORMAL LOW (ref 70–99)
Phosphorus: 3 mg/dL (ref 2.5–4.6)
Phosphorus: 4.4 mg/dL (ref 2.5–4.6)
Potassium: 4.8 mmol/L (ref 3.5–5.1)
Potassium: 5 mmol/L (ref 3.5–5.1)
Sodium: 135 mmol/L (ref 135–145)
Sodium: 138 mmol/L (ref 135–145)

## 2020-10-14 LAB — CBC
HCT: 24.3 % — ABNORMAL LOW (ref 39.0–52.0)
Hemoglobin: 7.2 g/dL — ABNORMAL LOW (ref 13.0–17.0)
MCH: 28.7 pg (ref 26.0–34.0)
MCHC: 29.6 g/dL — ABNORMAL LOW (ref 30.0–36.0)
MCV: 96.8 fL (ref 80.0–100.0)
Platelets: 23 10*3/uL — CL (ref 150–400)
RBC: 2.51 MIL/uL — ABNORMAL LOW (ref 4.22–5.81)
RDW: 19.1 % — ABNORMAL HIGH (ref 11.5–15.5)
WBC: 17.1 10*3/uL — ABNORMAL HIGH (ref 4.0–10.5)
nRBC: 0.8 % — ABNORMAL HIGH (ref 0.0–0.2)

## 2020-10-14 LAB — HEPATIC FUNCTION PANEL
ALT: 30 U/L (ref 0–44)
AST: 54 U/L — ABNORMAL HIGH (ref 15–41)
Albumin: 1.6 g/dL — ABNORMAL LOW (ref 3.5–5.0)
Alkaline Phosphatase: 174 U/L — ABNORMAL HIGH (ref 38–126)
Bilirubin, Direct: 4.1 mg/dL — ABNORMAL HIGH (ref 0.0–0.2)
Indirect Bilirubin: 2.1 mg/dL — ABNORMAL HIGH (ref 0.3–0.9)
Total Bilirubin: 6.2 mg/dL — ABNORMAL HIGH (ref 0.3–1.2)
Total Protein: 4.2 g/dL — ABNORMAL LOW (ref 6.5–8.1)

## 2020-10-14 LAB — GLUCOSE, CAPILLARY
Glucose-Capillary: 164 mg/dL — ABNORMAL HIGH (ref 70–99)
Glucose-Capillary: 175 mg/dL — ABNORMAL HIGH (ref 70–99)
Glucose-Capillary: 110 mg/dL — ABNORMAL HIGH (ref 70–99)
Glucose-Capillary: 45 mg/dL — ABNORMAL LOW (ref 70–99)

## 2020-10-14 MED ORDER — MORPHINE BOLUS VIA INFUSION
5.0000 mg | INTRAVENOUS | Status: DC | PRN
Start: 2020-10-14 — End: 2020-10-15
  Filled 2020-10-14: qty 5

## 2020-10-14 MED ORDER — ATROPINE SULFATE 1 MG/10ML IJ SOSY
PREFILLED_SYRINGE | INTRAMUSCULAR | Status: AC
  Administered 2020-10-14: 0.2 mg
  Filled 2020-10-14: qty 10

## 2020-10-14 MED ORDER — MORPHINE SULFATE (PF) 2 MG/ML IV SOLN
2.0000 mg | INTRAVENOUS | Status: DC | PRN
Start: 2020-10-14 — End: 2020-10-15

## 2020-10-14 MED ORDER — MIDAZOLAM HCL 2 MG/2ML IJ SOLN
2.0000 mg | INTRAMUSCULAR | Status: DC | PRN
Start: 2020-10-14 — End: 2020-10-15

## 2020-10-14 MED ORDER — GLYCOPYRROLATE 1 MG PO TABS: 1.0000 mg | ORAL_TABLET | ORAL | Status: DC | PRN

## 2020-10-14 MED ORDER — POLYVINYL ALCOHOL 1.4 % OP SOLN: 1.0000 [drp] | Freq: Four times a day (QID) | OPHTHALMIC | Status: DC | PRN

## 2020-10-14 MED ORDER — GLYCOPYRROLATE 0.2 MG/ML IJ SOLN: 0.2000 mg | INTRAMUSCULAR | Status: DC | PRN

## 2020-10-14 MED ORDER — DEXTROSE 50 % IV SOLN
INTRAVENOUS | Status: AC
  Administered 2020-10-14: 50 mL
  Filled 2020-10-14: qty 50

## 2020-10-14 MED ORDER — DIPHENHYDRAMINE HCL 50 MG/ML IJ SOLN: 25.0000 mg | INTRAMUSCULAR | Status: DC | PRN

## 2020-10-14 MED ORDER — MORPHINE 100MG IN NS 100ML (1MG/ML) PREMIX INFUSION
0.0000 mg/h | INTRAVENOUS | Status: DC
  Administered 2020-10-14: 5 mg/h via INTRAVENOUS
  Filled 2020-10-14: qty 100

## 2020-10-14 MED ORDER — ACETAMINOPHEN 325 MG PO TABS: 650.0000 mg | ORAL_TABLET | Freq: Four times a day (QID) | ORAL | Status: DC | PRN

## 2020-10-14 MED ORDER — DEXTROSE 5 % IV SOLN: INTRAVENOUS | Status: DC

## 2020-10-14 MED ORDER — ACETAMINOPHEN 650 MG RE SUPP: 650.0000 mg | Freq: Four times a day (QID) | RECTAL | Status: DC | PRN

## 2020-10-14 MED ORDER — METRONIDAZOLE IN NACL 5-0.79 MG/ML-% IV SOLN
500.0000 mg | Freq: Three times a day (TID) | INTRAVENOUS | Status: DC
  Administered 2020-10-14 (×2): 500 mg via INTRAVENOUS
  Filled 2020-10-14 (×2): qty 100

## 2020-10-15 ENCOUNTER — Encounter (HOSPITAL_COMMUNITY): Payer: Self-pay | Admitting: Family Medicine

## 2020-10-16 LAB — BODY FLUID CULTURE

## 2020-10-21 NOTE — Progress Notes (Signed)
[facility] Progress Note   49M s/p PEA arrest, persistent AMS, CKD4 s/p ddKT 2005 Tac/MMF/Pred  Assessment:   1. PEA cardiac arrest, out of hospital. CPR 12 min. Etiology unclear.  2. Septic shock, recurrent on high dose pressors 3. Pseudomonas pneumonia, hypoxic RF 4. AKI on CKD4, anuric. CRRT start date 10/03/20, line placement via CCM 1/14 5. CKD4, h/o renal transplant x 2 ('83 & '05). Baseline Cr ~2-2.2. Tac on hold 2/2 AMS #8 6. Anion gap metabolic acidosis; resolved 7. Hyperkalemia, mild; resolved 8. Encephalopathy, hypoxic and toxic metabolic, neuro following 9. 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 DM2 11. Hypophosphatemia 12. Anemia  Plan:  Cont CRRT at current settings, no change.  No heparin/anticoag in setting of thrombocytopenia.  Prognosis poor, GOC conversations ongoing with PCCM, 1/23 family meeting noted - cont current level of care but no compressions; ongoing discussions  Cont 4K bath  Cont to hold Tac   Subjective:   Remains intubated and sedated.  Remains on high dose pressors.  Filters lasting about 24h due to clotting.    Objective:   BP (!) 96/40    Pulse 72    Temp (!) 97.4 F (36.3 C) (Oral)    Resp (!) 28    Ht 5' 11"  (1.803 m)    Wt 84.3 kg    SpO2 100%    BMI 25.92 kg/m   Intake/Output Summary (Last 24 hours) at 10-18-20 1610 Last data filed at 18-Oct-2020 0600 Gross per 24 hour  Intake 3244.23 ml  Output 540 ml  Net 2704.23 ml   Weight change: 3 kg  Physical Exam: Gen: Critically ill looking male intubated and sedated  CVS:reg rate Resp:intubated Abd:nd Ext: minor edema  Neuro: sedated, unresponsive Access: rij temp line c/d/i  Imaging: DG CHEST PORT 1 VIEW  Result Date: 2020/10/18 CLINICAL DATA:  Acute respiratory failure. EXAM: PORTABLE CHEST 1 VIEW COMPARISON:  10/10/2020.  CT 10/05/2020. FINDINGS: Surgical clips upper chest. Endotracheal tube, NG tube, right IJ line, right subclavian line,  bilateral chest tubes in stable position. Pericardial calcification again noted. Cardiomegaly. Persistent bilateral pulmonary infiltrates/edema. Persistent small left pleural effusion. No pneumothorax. Bilateral rib fractures again noted. IMPRESSION: 1. Lines and tubes including bilateral chest tubes in stable position. No pneumothorax. 2. Pericardial calcification again noted.  Cardiomegaly. 3. Persistent bilateral pulmonary infiltrates/edema and small left pleural effusion. No significant change. Bilateral rib fractures again noted. Electronically Signed   By: Marcello Moores  Register   On: 2020-10-18 05:43    Labs: BMET Recent Labs  Lab 10/11/20 9604 10/11/20 1606 10/12/20 0305 10/12/20 1619 10/12/20 2323 10/13/20 0014 10/13/20 0314 10/13/20 0501 10/13/20 1623 10-18-2020 0500  NA 136 135 135 133* 131* 134* 134* 135 134* 135  K 4.3 4.9 4.2 5.0 4.5 4.6 4.6 4.3 4.8 4.8  CL 103 102 103 101 100  --  102  --  103 103  CO2 22 21* 20* 20* 19*  --  20*  --  19* 20*  GLUCOSE 129* 186* 88 160* 135*  --  133*  --  103* 184*  BUN 21 21 19 16 15   --  14  --  14 17  CREATININE 1.09 1.09 1.12 1.00 0.97  --  0.96  --  0.98 1.05  CALCIUM 7.0* 6.9* 6.9* 7.0* 6.7*  --  7.0*  --  7.1* 7.0*  PHOS 2.8 2.9 2.2* 4.3  --   --  2.8  --  3.1 3.0   CBC  Recent Labs  Lab 10/11/20 0252 10/12/20 0305 10/12/20 0837 10/13/20 0014 10/13/20 0314 10/13/20 0501 10/13/20 1154 2020-11-12 0500  WBC 28.1* 17.4*  --   --  18.2*  --   --  17.1*  HGB 7.2* 6.7*   < > 7.8* 7.6* 8.2*  --  7.2*  HCT 24.4* 21.7*   < > 23.0* 26.3* 24.0*  --  24.3*  MCV 94.6 94.8  --   --  96.3  --   --  96.8  PLT 34* 26*  --   --  21*  --  19* 23*   < > = values in this interval not displayed.    Medications:     atorvastatin  10 mg Per Tube Daily   chlorhexidine gluconate (MEDLINE KIT)  15 mL Mouth Rinse BID   Chlorhexidine Gluconate Cloth  6 each Topical Daily   famotidine  20 mg Per Tube Daily   feeding supplement (PROSource TF)  45  mL Per Tube QID   hydrocortisone sod succinate (SOLU-CORTEF) inj  50 mg Intravenous Q6H   insulin aspart  0-20 Units Subcutaneous Q4H   latanoprost  1 drop Both Eyes QHS   mouth rinse  15 mL Mouth Rinse 10 times per day   mupirocin cream   Topical BID   sodium chloride flush  10 mL Intracatheter Q8H   sodium chloride flush  10-40 mL Intracatheter Q12H   Justin Mend, MD  11-12-2020, 6:49 AM

## 2020-10-21 NOTE — Death Summary Note (Signed)
DEATH SUMMARY   Patient Details  Name: [name] MRN: [id number] DOB: [birth year]  Admission/Discharge Information   Admit Date:  Oct 16, 2020  Date of Death: Date of Death: Oct 29, 2020  Time of Death: Time of Death: 1841/12/01  Length of Stay: 11/17/22  Referring Physician: [name], MD   Reason(s) for Hospitalization  75 year old male with renal transplant history admitted 10/16/22 after PEA arrest 12 minutes.  Diagnoses  Preliminary cause of death: Septic shock ([place]) Secondary Diagnoses (including complications and co-morbidities):  Active Problems:   Kidney transplant status, cadaveric   Immunosuppressed status (HCC)   Chronic diastolic heart failure (HCC)   Acute encephalopathy   Diabetes mellitus type 2, insulin dependent ([place])   Community acquired pneumonia   Encounter for central line placement   Renal transplant, status post   Systolic and diastolic CHF, acute ([place])   Cardiac arrest ([place])   Acute respiratory failure with hypoxia ([place])   Shock ([place])   Pneumothorax, traumatic   Primary spontaneous pneumothorax   Chest tube in place   Brief Hospital Course (including significant findings, care, treatment, and services provided and events leading to death)  [name] 75 year old male with renal transplant history admitted 16-Oct-2022 after PEA arrest 12 minutes.  PMH significant for CKD (previously on HD, but is s/p renal transplant in 12/02/2003 at [facility]), AF not on [facility], who presented to [facility] ED after cardiac arrest athome 10-16-22. Apparently 1/11 in the evening hours he developed chest pain and dyspnea, but proceeded to go to bed. Then Oct 16, 2022 AM his wife awoke around 0930 and found him to be unresponsive and foaming at the mouth. He was still breathing. As she called EMS he fell to the floor. Upon EMS arrival the patient was pulseless and ACLS was initiated. CPR was performed for approximately 12 minutes with two doses of epinephrine. He suffered injury to his L periorbit at some  point during all this, raising concern for head injury and he was treated as a level one trauma in ED. Upon arrival he was unresponsive and was immediately intubated. He was emergently taken for pan-CT scan. No operable injury was identified. PCCM asked to admit. ICU course complicated by progressive acidosis / AKI requiring CRRT, prolonged mechanical ventilation, shock and anemia.  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 Rt pneumothorax after CPR. Spontaneous Lt pneumothorax on 1/19. Pseudomonas pneumonia complicated by left empyema -PCV as rest mode -wean PEEP / fiO2 for sats >90% -not a candidate for PSV weaning due to hemodynamic instability / neurologic status  -bilateral chest tubes, follow drainage   Septic shock secondary to Pseudomonas pneumonia complicated by empyema 1 of 4 cultures with S. Epidermis, likely contaminant Dusky Toes / Pressor Dependent Absent L Radial Pulse, + L Ulnar  -D13/x abx -continue renal dosing cefepime, flagyl added per ID with mixed resp culture (final with pan-sens pseudomonas only)  -appreciate ID evaluation  -follow intermittent lactic acid (persistently elevated, jaundiced, ? Shock liver) -allow positive balance -assess arterial duplex of LUE, but not an anticoagulation candidate currently   AKI in setting of shock from PEA arrest. CKD  s/p renal transplant. -appreciate Nephrology  -continue CVVHD, not pulling volume  -hold cellcept, prednisone  -stress dose steroids  PEA arrest PAF, chronic combined CHF. -tele monitoring   -hold anticoagulation due to thrombocytopenia   DM type 2 poorly controlled with hyperglycemia. Having episodes of hypoglycemia on 1/20. -SSI -stop D10  -continue TF re-challenge at 20 ml/hr, no titration for now  R, L rib fx: flail segments by CT on the right. R maxillary sinus wall fracture. -pain control with fentanyl  Anemia of critical illness and chronic disease. Thrombocytopenia. S/p PRBC 1/19 -ASA on hold since 1/24  -transfuse for Hgb <7% -HIT panel negative  -DIC panel with no schistocytes, fibrinogen >500  Goals of care. We met with family at bedside. After reviewing all of the patients multiple medical problems they decided to pursue comfort measures.  The patient passed peacefully here in the ICU on comfort measures.    Pertinent Labs and Studies   Significant Diagnostic Studies EEG  Result Date: 10/07/2020 Lora Havens, MD     09/22/2020  1:08 PM Patient Name: [name] MRN: [id number] Epilepsy Attending: Lora Havens Referring Physician/Provider: Dr Reather Laurence Date:  10/08/2020 Duration: 23.42 mins Patient history: 75 year old male status post cardiac arrest.  EEG to evaluate for seizures. Level of alertness: Comatose AEDs during EEG study: None Technical aspects: This EEG study was done with scalp electrodes positioned according to the 10-20 International system of electrode placement. Electrical activity was acquired at a sampling rate of 500Hz  and reviewed with a high frequency filter of 70Hz  and a low frequency filter of 1Hz . EEG data were recorded continuously and digitally stored. Description: EEG showed currently generalized background suppression EEG was not reactive due to calcium lesion.  Hyperventilation and photic stimulation were not performed.   Of note, EEG was difficult to interpret due to significant myogenic artifact. ABNORMALITY -Background suppression, generalized IMPRESSION: This technically difficult study is suggestive of  profound diffuse encephalopathy, nonspecific etiology.  No seizures or epileptiform discharges were seen throughout the recording. Lora Havens   DG Neck Soft Tissue  Result Date: 10/06/2020 CLINICAL DATA:  Possible fluid or air around catheter EXAM: NECK SOFT TISSUES - 1+ VIEW COMPARISON:  CT 10/19/2020 FINDINGS: Limited by patient condition. Right subclavian central venous catheter tip incompletely visualized. Right jugular venous catheter with tip over the SVC. Moderate gas within the soft tissues of the right greater than left neck. Possible small amount of air within the upper mediastinum. Multiple right upper rib fractures. IMPRESSION: 1. Limited by patient condition. 2. Moderate gas/air within the soft tissues of the right greater than left neck. Possible small amount of air within the upper mediastinum. Electronically Signed   By: Donavan Foil M.D.   On: 10/06/2020 17:52   DG Abd 1 View  Result Date: 10/02/2020 CLINICAL DATA:  Excessive oral secretions. EXAM: ABDOMEN - 1 VIEW COMPARISON:  Abdominal CT yesterday. FINDINGS: Enteric tube is in place. Tip is likely below the diaphragm in the stomach, exact location of the side port is difficult to delineate, but may be in the region of the gastroesophageal junction. No bowel dilatation in the upper abdomen. Right pelvic calcification corresponds to a calcified renal transplant on CT yesterday. IMPRESSION: Enteric tube in place, tip likely below the diaphragm in the stomach. The exact location of the side port is difficult to delineate, but may be in the region of the gastroesophageal junction. Electronically Signed   By: Keith Rake M.D.   On:  10/02/2020 18:24   CT HEAD WO CONTRAST  Result Date: 10/11/2020 CLINICAL DATA:  Mental status change, unknown cause EXAM: CT HEAD WITHOUT CONTRAST TECHNIQUE: Contiguous axial images were obtained from the base of the skull through the vertex without intravenous contrast. COMPARISON:  10/03/2020 and  prior. FINDINGS: Brain: No intracranial hemorrhage. Redemonstration of small prior infarcts involving the right parietal and bilateral occipital regions. Additional smaller bilateral cerebral/cerebellar and left basal ganglia insults are not well demonstrated. No new focal hypodensity. No mass lesion. No midline shift, ventriculomegaly or extra-axial fluid collection. Mild cerebral atrophy with ex vacuo dilatation. Vascular: No hyperdense vessel or unexpected calcification. Bilateral skull base atherosclerotic calcifications. Skull: Negative for fracture or focal lesion. Sinuses/Orbits: Decreased left periorbital and right facial edema. Minimal sinus mucosal thickening. Trace bilateral mastoid free fluid. Other: None. IMPRESSION: Redemonstration of prior small right parietal and bilateral occipital infarcts. Additional smaller prior insults involving the cerebrum/cerebellum and left basal ganglia are not well demonstrated. No new focal hypodensity to suggest acute infarct. No intracranial hemorrhage. Electronically Signed   By: Primitivo Gauze M.D.   On: 10/11/2020 11:42   CT HEAD WO CONTRAST  Result Date: 10/03/2020 CLINICAL DATA:  75 year old male with chest pain beginning at 0200 hours, found unresponsive at 0900 hours. Status post CPR. EXAM: CT HEAD WITHOUT CONTRAST TECHNIQUE: Contiguous axial images were obtained from the base of the skull through the vertex without intravenous contrast. COMPARISON:  Head CT 08/26/2020 and earlier. Face and cervical spine CT today reported separately. FINDINGS: Brain: Stable cerebral volume. No midline shift, ventriculomegaly, mass effect, evidence of mass lesion, intracranial hemorrhage or evidence of cortically based acute infarction. Stable gray-white matter differentiation throughout the brain. Mild for age chronic white matter hypodensity appears stable. Vascular: Advanced chronic calcified atherosclerosis both at the skull base and in the scalp. Skull: Stable.  No  skull fracture identified. Sinuses/Orbits: Visualized paranasal sinuses and mastoids are stable and well pneumatized. Other: Broad-based and 13 mm thick scalp hematoma at the left forehead, which also tracks across the superficial left orbit. The left globe and bilateral intraorbital soft tissues appears stable, within normal limits. No underlying left frontal bone fracture identified. See also face CT reported separately. IMPRESSION: 1. No acute intracranial abnormality. Stable non contrast CT appearance of the brain. 2. Left forehead and periorbital scalp hematoma with no underlying calvarium fracture. 3. See also CT Face reported separately today. Preliminary findings on exam were reviewed in person with Dr. Reather Laurence on 09/28/2020 at 1130 hours. Electronically Signed   By: Genevie Ann M.D.   On: 09/25/2020 12:05   CT ANGIO NECK W OR WO CONTRAST  Result Date: 10/12/2020 CLINICAL DATA:  75 year old male with chest pain beginning at 0200 hours, found unresponsive at 0900 hours. Status post CPR. EXAM: CT ANGIOGRAPHY HEAD AND NECK TECHNIQUE: Multidetector CT imaging of the head and neck was performed using the standard protocol during bolus administration of intravenous contrast. Multiplanar CT image reconstructions and MIPs were obtained to evaluate the vascular anatomy. Carotid stenosis measurements (when applicable) are obtained utilizing NASCET criteria, using the distal internal carotid diameter as the denominator. CONTRAST:  17mL OMNIPAQUE IOHEXOL 350 MG/ML SOLN COMPARISON:  Head face and cervical spine CT reported separately today. FINDINGS: CTA NECK Skeleton: Cervical spine detailed separately today. Rib fractures also detailed separately today on chest CT (please see that report). Carious dentition. Upper chest: Detailed separately on chest CT today (please see that report). Other neck: Endotracheal tube in place into the chest. Small  volume fluid in the pharynx. Patchy contusion/hematoma affecting the  right parotid gland and right postauricular soft tissues, see face CT reported separately. No other acute finding identified in the neck. Aortic arch: 3 vessel arch configuration. Calcified aortic atherosclerosis. Right carotid system: Tortuous brachiocephalic artery and proximal right CCA. Mild right CCA calcified plaque without stenosis. Mild to moderate calcified plaque at the right carotid bifurcation without stenosis. Left carotid system: Similar tortuosity and mild calcified plaque affecting the left CCA and left carotid bifurcation with no stenosis to the skull base. Vertebral arteries: Mildly tortuous proximal right subclavian artery with mild plaque and no stenosis. Heavily calcified right vertebral artery origin which remains patent. There is moderate to severe right V1 segment stenosis (series 6, image 120). But the right vertebral artery is dominant and remains patent to the skull base without V2 or V3 segment stenosis. Proximal left subclavian artery calcified plaque is mild without stenosis. There is calcified plaque at the left vertebral artery origin but no associated stenosis. Non dominant left vertebral with tortuous V1 segment. Patent left vertebral to the skull base without additional plaque or stenosis. CTA HEAD Posterior circulation: Chronically dolichoectatic dominant distal right vertebral artery with circumferential calcified plaque but no significant stenosis. Right PICA origin remains normal. Non dominant left vertebral is diminutive beyond the patent left PICA origin but remains patent to the vertebrobasilar junction. Patent basilar artery with mild tortuosity, no stenosis. SCA and PCA origins are normal. Posterior communicating arteries are diminutive or absent. Bilateral PCA branches are patent, with mild irregularity on the right. Anterior circulation: Patent and mildly dolichoectatic bilateral ICA siphons with abundant calcified plaque, but no significant siphon stenosis. Normal  ophthalmic artery origins. Patent carotid termini. Patent MCA and ACA origins. Dominant right A1 segment. Anterior communicating artery is normal. Bilateral ACA branches are normal aside from mild tortuosity and irregularity. Left MCA M1 segment and bifurcation are patent without stenosis. Right MCA M1 segment and bifurcation are patent without stenosis. Bilateral MCA branches are normal aside from tortuosity. Venous sinuses: Early contrast timing, but the major dural venous sinuses appear to be patent. Anatomic variants: Dominant right vertebral artery. Dominant right ACA A1. Review of the MIP images confirms the above findings IMPRESSION: 1. Negative for large vessel occlusion. Widespread atherosclerosis in the head and neck. These results were discussed with Dr. Reather Laurence on 10/12/2020 at 1130 hours. 2. 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 But no other significant arterial stenosis in the head or neck. Generalized arterial tortuosity. 3. Acute findings in the chest reported separately on Chest CT today (please see that report). Electronically Signed   By: Genevie Ann M.D.   On: 10/13/2020 11:52   CT CHEST W CONTRAST  Result Date: 09/24/2020 CLINICAL DATA:  75 year old male with chest pain beginning at 0200 hours, found unresponsive at 0900 hours. Status post CPR. EXAM: CT CHEST, ABDOMEN, AND PELVIS WITH CONTRAST TECHNIQUE: Multidetector CT imaging of the chest, abdomen and pelvis was performed following the standard protocol during bolus administration of intravenous contrast. CONTRAST:  19mL OMNIPAQUE IOHEXOL 350 MG/ML SOLN COMPARISON:  Noncontrast CT Abdomen and Pelvis 08/26/2020. CTA head and neck today reported separately. FINDINGS: CT CHEST FINDINGS Cardiovascular: Stable cardiomegaly. Dense anterior pericardial calcification (series 5, image 40) without pericardial effusion. Thoracic aorta appears intact with chronic tortuosity and calcified  atherosclerosis. Calcified coronary artery plaque. Other central mediastinal vascular structures appear intact. Mediastinum/Nodes: Negative for mediastinal hematoma or lymphadenopathy.  Lungs/Pleura: Layering bilateral low-density pleural effusions appear slightly larger since last month, small to moderate on the right and small on the left. Associated compressive atelectasis. No pulmonary contusion or pneumothorax identified. Endotracheal tube tip terminates near the carina (series 8, image 109) with retained secretions at the tip of the tube. Musculoskeletal: Osteopenia.  Numerous acute rib fractures. Fractured right lateral 1st through 7th ribs. The right right 2nd through 4th anterior ribs are also fractured. Fractures are up to 1 full shaft width displaced (right lateral 5th rib fracture on series 7, image 82. There is a small volume of intercostal hematoma identified. Acutely fractured left anterior 2nd through 7th ribs, angulated and with up to 1 full shaft width displacement. Superimposed chronic left posterior rib fractures. Small volume intercostal hematoma. No sternal or thoracic vertebral fracture identified. Visible shoulder osseous structures appear intact. CT ABDOMEN PELVIS FINDINGS Hepatobiliary: Liver and gallbladder appear stable and intact. Pancreas: Stable and intact. Spleen: Stable and intact. Adrenals/Urinary Tract: Normal adrenal glands. 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 Contralateral left pelvis transplant kidney with appropriate contrast enhancement and delayed post-contrast excretion. Mildly distended but otherwise unremarkable urinary bladder. Stomach/Bowel: No dilated large or small bowel. Widespread large bowel diverticulosis. No free air or free fluid. An elongated appendix remains normal but tracks to the dome of the right liver under the diaphragm as before (series 5, image 50). Decompressed stomach. Vascular/Lymphatic: Diffuse  calcified atherosclerosis including Aortoiliac calcified atherosclerosis. Major arterial structures remain patent. Portal venous system is patent. No lymphadenopathy. Reproductive: Negative. Other: No pelvic free fluid. Generalized body wall edema appears stable since last month. Musculoskeletal: Osteopenia. Lumbar vertebrae, sacrum, SI joints, pelvis and proximal femurs appear stable and intact. IMPRESSION: 1. Numerous acute rib fractures: Right ribs 1 through 7 (with rib 2 through 4 flail segment) and left ribs 2 through 7. Mild intercostal hematoma, but no pneumothorax, hemothorax or pulmonary contusion. 2. No other acute traumatic injury identified in the chest, abdomen, or pelvis. 3. Low-density layering pleural effusions appear mildly increased from last month. Chronic atelectasis. 4. Endotracheal tube tip near the carina, retract 1 cm for more optimal position. 5. 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 Aortic Atherosclerosis (ICD10-I70.0). Above findings were reviewed in person with Dr. Reather Laurence on 10/12/2020 at 1130 hours. Electronically Signed   By: Genevie Ann M.D.   On: 10/17/2020 12:18   CT CERVICAL SPINE WO CONTRAST  Result Date: 09/29/2020 CLINICAL DATA:  75 year old male with chest pain beginning at 0200 hours, found unresponsive at 0900 hours. Status post CPR. EXAM: CT CERVICAL SPINE WITHOUT CONTRAST TECHNIQUE: Multidetector CT imaging of the cervical spine was performed without intravenous contrast. Multiplanar CT image reconstructions were also generated. COMPARISON:  Head and Face CT reported separately today. Cervical spine CT 05/26/2020. FINDINGS: Alignment: Straightening of cervical lordosis today. Cervicothoracic junction alignment is within normal limits. Bilateral posterior element alignment is within normal limits. Skull base and vertebrae: Osteopenia. Visualized skull base  is intact. No atlanto-occipital dissociation. No acute osseous abnormality identified. Soft tissues and spinal canal: No prevertebral fluid or swelling. No visible canal hematoma. Endotracheal tube within the airway. Right parotid region soft tissue injury reported on the face CT separately. Disc levels: Advanced chronic disc and endplate degeneration K5-V3 and C6-C7 appears stable. Upper chest: Reported separately today. Other: Diffuse calcified atherosclerosis. IMPRESSION: 1.  No acute traumatic injury identified in the cervical spine. 2. See  also Face and Chest CT today reported separately. Electronically Signed   By: Genevie Ann M.D.   On: 09/29/2020 12:07   MR BRAIN W WO CONTRAST  Addendum Date: 10/03/2020   ADDENDUM REPORT: 10/03/2020 14:08 ADDENDUM: The study was reviewed by telephone with Dr. Rory Percy on 10/03/2020 at 1400 hours. There is the suggestion of subtle FLAIR and diffusion-weighted signal hyperintensity involving cortex primarily in the posterior aspects of both cerebral hemispheres, however this is not clearly confirmed on standard T2 weighted sequences and there is no gyral edema. Findings are indeterminate for a more global hypoxic-ischemic injury. Electronically Signed   By: Logan Bores M.D.   On: 10/03/2020 14:08   Result Date: 10/03/2020 CLINICAL DATA:  Cardiac arrest.  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 EXAM: MRI HEAD WITHOUT AND WITH CONTRAST TECHNIQUE: Multiplanar, multiecho pulse sequences of the brain and surrounding structures were obtained without and with intravenous contrast. CONTRAST:  72mL GADAVIST GADOBUTROL 1 MMOL/ML IV SOLN COMPARISON:  Head and maxillofacial CTs 09/30/2020 and MRI 05/28/2020 FINDINGS: Brain: There are scattered small acute to early subacute cortical and subcortical infarcts in the frontal, parietal, and occipital lobes bilaterally with small infarcts also noted in the left basal ganglia and in both cerebellar hemispheres. A small amount of petechial blood  products are associated with a small left occipital infarct. There are a few scattered chronic cerebral microhemorrhages bilaterally. There is mild cerebral atrophy. No mass, midline shift, or extra-axial fluid collection is identified. Vascular: Major intracranial vascular flow voids are preserved. Skull and upper cervical spine: Unremarkable bone marrow signal. Sinuses/Orbits: Unremarkable orbits. Mild mucosal thickening in the paranasal sinuses. Small bilateral mastoid effusions. Other: Partially visualized right facial and left periorbital soft tissue swelling with hematoma is demonstrated on the prior CT. IMPRESSION: Scattered small acute to early subacute cerebral and cerebellar infarcts. Electronically Signed: By: Logan Bores M.D. On: 10/03/2020 13:46   CT ABDOMEN PELVIS W CONTRAST  Result Date: 10/15/2020 CLINICAL DATA:  75 year old male with chest pain beginning at 0200 hours, found unresponsive at 0900 hours. Status post CPR. EXAM: CT CHEST, ABDOMEN, AND PELVIS WITH CONTRAST TECHNIQUE: Multidetector CT imaging of the chest, abdomen and pelvis was performed following the standard protocol during bolus administration of intravenous contrast. CONTRAST:  142mL OMNIPAQUE IOHEXOL 350 MG/ML SOLN COMPARISON:  Noncontrast CT Abdomen and Pelvis 08/26/2020. CTA head and neck today reported separately. FINDINGS: CT CHEST FINDINGS Cardiovascular: Stable cardiomegaly. Dense anterior pericardial calcification (series 5, image 40) without pericardial effusion. Thoracic aorta appears intact with chronic tortuosity and calcified atherosclerosis. Calcified coronary artery plaque. Other central mediastinal vascular structures appear intact. Mediastinum/Nodes: Negative for mediastinal hematoma or lymphadenopathy. Lungs/Pleura: Layering bilateral low-density pleural effusions appear slightly larger since last month, small to moderate on the right and small on the left. Associated compressive atelectasis. No pulmonary  contusion or pneumothorax identified. Endotracheal tube tip terminates near the carina (series 8, image 109) with retained secretions at the tip of the tube. Musculoskeletal: Osteopenia.  Numerous acute rib fractures. Fractured right lateral 1st through 7th ribs. The right right 2nd through 4th anterior ribs are also fractured. Fractures are up to 1 full shaft width displaced (right lateral 5th rib fracture on series 7, image 82. There is a small volume of intercostal hematoma identified. Acutely fractured left anterior 2nd through 7th ribs, angulated and with up to 1 full shaft width displacement. Superimposed chronic left posterior rib fractures. Small volume intercostal hematoma. No sternal or thoracic vertebral fracture identified. Visible shoulder osseous structures appear  intact. CT ABDOMEN PELVIS FINDINGS Hepatobiliary: Liver and gallbladder appear stable and intact. Pancreas: Stable and intact. Spleen: Stable and intact. Adrenals/Urinary Tract: Normal adrenal glands. 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 Contralateral left pelvis transplant kidney with appropriate contrast enhancement and delayed post-contrast excretion. Mildly distended but otherwise unremarkable urinary bladder. Stomach/Bowel: No dilated large or small bowel. Widespread large bowel diverticulosis. No free air or free fluid. An elongated appendix remains normal but tracks to the dome of the right liver under the diaphragm as before (series 5, image 50). Decompressed stomach. Vascular/Lymphatic: Diffuse calcified atherosclerosis including Aortoiliac calcified atherosclerosis. Major arterial structures remain patent. Portal venous system is patent. No lymphadenopathy. Reproductive: Negative. Other: No pelvic free fluid. Generalized body wall edema appears stable since last month. Musculoskeletal: Osteopenia. Lumbar vertebrae, sacrum, SI joints, pelvis and proximal femurs appear stable and  intact. IMPRESSION: 1. Numerous acute rib fractures: Right ribs 1 through 7 (with rib 2 through 4 flail segment) and left ribs 2 through 7. Mild intercostal hematoma, but no pneumothorax, hemothorax or pulmonary contusion. 2. No other acute traumatic injury identified in the chest, abdomen, or pelvis. 3. Low-density layering pleural effusions appear mildly increased from last month. Chronic atelectasis. 4. Endotracheal tube tip near the carina, retract 1 cm for more optimal position. 5. 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 Aortic Atherosclerosis (ICD10-I70.0). Above findings were reviewed in person with Dr. Reather Laurence on 10/13/2020 at 1130 hours. Electronically Signed   By: Genevie Ann M.D.   On: 10/04/2020 12:18   DG Pelvis Portable  Result Date: 10/16/2020 CLINICAL DATA:  75 year old male with chest pain beginning at 0200 hours, found unresponsive at 0900 hours. Status post CPR. EXAM: PORTABLE PELVIS 1-2 VIEWS COMPARISON:  CT Abdomen and Pelvis 08/26/2020. FINDINGS: Portable AP view at 1100 hours severe diffuse calcified atherosclerosis again noted. Chronic dystrophic calcifications in the right hemipelvis, probably calcified atrophied renal transplant based on CT appearance. Femoral heads normally located. Superior iliac wings are excluded. No pelvis fracture identified. Grossly intact proximal femurs. IMPRESSION: 1. No acute fracture or dislocation identified about the visible pelvis. 2. Diffuse severe calcified atherosclerosis, calcified pelvic transplant kidney. Electronically Signed   By: Genevie Ann M.D.   On: 10/19/2020 11:26   DG CHEST PORT 1 VIEW  Result Date: Nov 12, 2020 CLINICAL DATA:  Acute respiratory failure. EXAM: PORTABLE CHEST 1 VIEW COMPARISON:  10/10/2020.  CT 10/11/2020. FINDINGS: Surgical clips upper chest. Endotracheal tube, NG tube, right IJ line, right subclavian line,  bilateral chest tubes in stable position. Pericardial calcification again noted. Cardiomegaly. Persistent bilateral pulmonary infiltrates/edema. Persistent small left pleural effusion. No pneumothorax. Bilateral rib fractures again noted. IMPRESSION: 1. Lines and tubes including bilateral chest tubes in stable position. No pneumothorax. 2. Pericardial calcification again noted.  Cardiomegaly. 3. Persistent bilateral pulmonary infiltrates/edema and small left pleural effusion. No significant change. Bilateral rib fractures again noted. Electronically Signed   By: Marcello Moores  Register   On: 11-12-2020 05:43   DG Chest Port 1 View  Result Date: 10/10/2020 CLINICAL DATA:  Respiratory failure EXAM: PORTABLE CHEST 1 VIEW COMPARISON:  10/09/2020 FINDINGS: Endotracheal tube in good position. NG tube enters the stomach. Right jugular central venous catheter in the proximal SVC. Right subclavian central venous catheter distal SVC. Bilateral pigtail chest tube in place. No pneumothorax. Improvement in subcutaneous emphysema. Right lower lobe airspace disease with mild improvement. Moderate left lower lobe airspace disease unchanged  from yesterday. IMPRESSION: Support lines remain in good position No pneumothorax identified Bibasilar airspace disease Electronically Signed   By: Franchot Gallo M.D.   On: 10/10/2020 07:53   DG Chest Port 1 View  Result Date: 10/09/2020 CLINICAL DATA:  Respiratory failure EXAM: PORTABLE CHEST 1 VIEW COMPARISON:  October 08, 2020 FINDINGS: Bilateral chest tubes are again noted. There are probable loculated pneumothoraces at the lung bases. The heart size remains unchanged. There is a persistent dense retrocardiac opacity. Extensive subcutaneous emphysema is again noted. The right-sided PICC line and dialysis catheter stable. The endotracheal tube terminates above the carina. The enteric tube extends below the left hemidiaphragm. There is pneumomediastinum. IMPRESSION: 1. Interval placement of a  left-sided chest tube with significant interval decrease in size of the left-sided pneumothorax. There are probable small residual loculated pneumothoraces at the lung bases. 2. Probable pneumomediastinum, better appreciated on this study but likely stable from prior. 3. Remaining lines and tubes are stable. 4. Persistent extensive subcutaneous emphysema. 5. Persistent airspace opacities at the lung bases which may represent consolidation or atelectasis. Electronically Signed   By: Constance Holster M.D.   On: 10/09/2020 02:44   DG Chest Port 1 View  Result Date: 10/08/2020 CLINICAL DATA:  Chest tube placement EXAM: PORTABLE CHEST 1 VIEW COMPARISON:  10/08/2020 FINDINGS: Endotracheal tube with the tip 4.5 cm above the carina. Right jugular central venous sheath with the tip projecting over the SVC. Right subclavian central venous catheter with the tip projecting over the cavoatrial junction. Nasogastric tube with the tip coursing below the diaphragm. Bilateral chest tubes are present. Small right pneumothorax measuring less than 10%. Possible trace left basilar pneumothorax. Bilateral small pleural effusions. Bibasilar airspace disease likely reflecting atelectasis. Stable cardiomegaly. Multiple bilateral rib fractures. Extensive bilateral soft tissue emphysema along the right and left chest walls. IMPRESSION: 1. Bilateral chest tubes in place. Small right pneumothorax measuring less than 10%. Possible trace left basilar pneumothorax. 2. Bilateral small pleural effusions with bibasilar atelectasis. 3. Multiple bilateral rib fractures. 4. Extensive bilateral soft tissue emphysema. Electronically Signed   By: Kathreen Devoid   On: 10/08/2020 16:58   DG Chest Port 1 View  Result Date: 10/08/2020 CLINICAL DATA:  Respiratory failure. EXAM: PORTABLE CHEST 1 VIEW COMPARISON:  Chest x-ray from same day at 0435 hours. FINDINGS: Unchanged endotracheal and enteric tubes. Unchanged right internal jugular and right  subclavian central venous catheters. The right pigtail chest tube appears slightly retracted in the interval. Enlarging now moderate left pneumothorax. No right pneumothorax. There may be some new pneumomediastinum as well. New partial collapse of the left lung with patchy consolidation in the lingula and left lower lobe. Unchanged consolidation at the right lung base. No pleural effusion. Stable cardiomediastinal silhouette. New marked subcutaneous emphysema throughout the chest and lower neck. IMPRESSION: 1. Enlarging now moderate left pneumothorax. New partial collapse of the left lung. 2. The right chest tube appears slightly retracted in the interval. No right pneumothorax. 3. New marked subcutaneous emphysema throughout the chest and lower neck. Suspected new small volume pneumomediastinum. 4. Unchanged lingular and bilateral lower lobe airspace disease. These results were called by telephone on 10/08/2020 at 4:36 pm to provider Elisabeth Cara SOOD , who verbally acknowledged these results. Electronically Signed   By: Titus Dubin M.D.   On: 10/08/2020 16:43   DG Chest Port 1 View  Result Date: 10/08/2020 CLINICAL DATA:  Respiratory failure. EXAM: PORTABLE CHEST 1 VIEW COMPARISON:  10/07/2020.  CT 09/23/2020. FINDINGS: Endotracheal tube, NG tube,  right IJ line, right subclavian line, right chest tube in stable position. Stable cardiomegaly. Persistent left mid lung and bibasilar infiltrates/edema. Persistent left-sided moderate pleural effusion. Small left apical pneumothorax again noted. No right pneumothorax noted. Right chest wall subcutaneous emphysema again noted. Bilateral rib fractures again noted. IMPRESSION: 1. Lines and tubes including right chest tube in stable position. Small left apical pneumothorax again noted. No right pneumothorax noted. Right chest wall subcutaneous emphysema again noted. 2. Persistent left mid lung and bibasilar infiltrates/edema. Persistent left-sided moderate pleural  effusion. Similar findings noted on prior exam. Electronically Signed   By: [place]   On: 10/08/2020 05:30   DG Chest Port 1 View  Result Date: 10/07/2020 CLINICAL DATA:  Subcutaneous emphysema EXAM: PORTABLE CHEST 1 VIEW COMPARISON:  Multiple prior studies most recent October 07, 2020 FINDINGS: Endotracheal tube remains within the trachea between the clavicular heads in grossly similar position accounting for changes in patient position and rotation on the current radiograph. RIGHT-sided central venous access device RIGHT IJ catheter in the mid to distal superior vena cava. Subclavian line terminates at the caval to atrial junction. Gastric tube in place. Cardiomediastinal contours grossly stable, largely obscured in the LEFT chest as before though with increasing opacity in the LEFT mid chest compared to the previous radiograph. RIGHT lower lobe airspace disease persists with pigtail catheter in the RIGHT chest and signs of subcutaneous emphysema grossly similar with respect imaged portions of the chest compared to prior study. Some lucency along the RIGHT inferior chest is similar to the previous study. Lucency along the RIGHT lateral chest may represent skin fold as there are lung markings that are seen peripherally or anterior component to small pneumothorax in the RIGHT chest. Possible small LEFT pneumothorax grossly similar, not well evaluated due to degree of rotation. Rib fractures not well evaluated on the current study. IMPRESSION: 1. Increasing opacity in the LEFT mid and lower chest compared to previous radiograph. May be due to effusion or increasing volume loss. Subtle lucency at the LEFT lung apex is grossly similar though not well evaluated potentially small pneumothorax. 2. Persistent RIGHT lower lobe airspace disease with pigtail catheter in place. 3. Possible small RIGHT basilar pneumothorax versus is artifact from adjacent consolidative lung likely in RIGHT middle lobe and  hemidiaphragm also with subtle lucency along the RIGHT lateral chest, potentially a skin fold. Findings not changed with RIGHT-sided chest tube in place. Attention on follow-up 4. Subcutaneous emphysema as before. 5. Cardiomegaly with cardiac calcifications. These results will be called to the ordering clinician or representative by the Radiologist Assistant, and communication documented in the PACS or [employer]. Electronically Signed   By: Zetta Bills M.D.   On: 10/07/2020 10:54   DG Chest Port 1 View  Result Date: 10/07/2020 CLINICAL DATA:  Pneumothorax. EXAM: PORTABLE CHEST 1 VIEW COMPARISON:  10/07/2020. FINDINGS: Endotracheal tube, NG tube, right IJ sheath, right subclavian line in stable position. Right chest tube in stable position. No pneumothorax on the right noted. Again tiny left apical pneumothorax may be present. Pneumomediastinum again cannot be excluded. Stable cardiomegaly. Diffuse bilateral pulmonary infiltrates/edema again noted. Bilateral pleural effusions again noted. Bilateral chest wall subcutaneous emphysema again noted. IMPRESSION: 1. Lines and tubes in stable position. Right chest tube in stable position. No pneumothorax on the right noted. Again tiny left apical pneumothorax may be present. Pneumomediastinum again cannot be excluded. Bilateral chest wall subcutaneous emphysema again noted. 2.  Cardiomegaly. 3. Diffuse bilateral pulmonary infiltrates/edema and bilateral pleural effusions  again noted without interim change. Electronically Signed   By: Marcello Moores  Register   On: 10/07/2020 05:21   DG Chest Port 1 View  Result Date: 10/07/2020 CLINICAL DATA:  Pneumothorax EXAM: PORTABLE CHEST 1 VIEW COMPARISON:  October 06, 2020 FINDINGS: The right-sided chest tube is stable in positioning. Again noted is a possible small left apical pneumothorax. No definite right-sided pneumothorax. The lines and tubes are essentially stable in positioning. Bilateral airspace opacities are  noted. There is a dense retrocardiac opacity with a probable left-sided pleural effusion. Again noted is subcutaneous emphysema involving the patient's chest wall and neck. There is questionable pneumomediastinum. IMPRESSION: 1. Lines and tubes are essentially stable in positioning. 2. Possible small left apical pneumothorax, unchanged from prior. 3. Persistent bilateral airspace opacities and probable left-sided pleural effusion. 4. Persistent subcutaneous emphysema.  Probable pneumomediastinum. Electronically Signed   By: Constance Holster M.D.   On: 10/07/2020 00:43   DG Chest Port 1 View  Result Date: 10/06/2020 CLINICAL DATA:  Intubated concern for air around right catheter EXAM: PORTABLE CHEST 1 VIEW COMPARISON:  10/05/2020, 10/04/2020, 10/03/2020 FINDINGS: Endotracheal tube tip is about 4 cm superior to the carina. Esophageal tube tip below the diaphragm but incompletely visualized. Right IJ central venous catheter tip over the SVC. Right subclavian central venous catheter tip over the proximal right atrium. Interval finding of moderate soft tissue emphysema over the right greater than left neck and right chest wall. Right lower chest tube remains in place. New pneumomediastinum. Similar left greater than right basilar airspace disease. Enlarged cardiomediastinal silhouette. Possible small left apical pneumothorax. Multiple bilateral rib fractures. IMPRESSION: 1. Interval finding of moderate soft tissue emphysema over the neck and right chest wall with new pneumomediastinum. Possible small left apical pneumothorax. 2. Similar cardiomegaly and left greater than right basilar airspace disease. These results will be called to the ordering clinician or representative by the Radiologist Assistant, and communication documented in the PACS or [employer]. Electronically Signed   By: Donavan Foil M.D.   On: 10/06/2020 17:58   DG Chest Port 1 View  Result Date: 10/05/2020 CLINICAL DATA:  75 year old  male status post fall, PEA arrest. History of renal failure, renal transplants. Right pneumothorax treated with chest tube. EXAM: PORTABLE CHEST 1 VIEW COMPARISON:  Portable chest 10/04/2020 and earlier. FINDINGS: Portable AP upright view at 0506 hours. Stable lines and tubes. No pneumothorax is evident today. Stable cardiomegaly and mediastinal contours. Continued dense retrocardiac opacity. No pulmonary edema. Paucity of bowel gas. IMPRESSION: 1. Stable lines and tubes. No pneumothorax is evident today. 2. Cardiomegaly with continued dense lung base opacity suggesting lower lobe collapse or consolidation. Electronically Signed   By: Genevie Ann M.D.   On: 10/05/2020 07:36   DG CHEST PORT 1 VIEW  Result Date: 10/04/2020 CLINICAL DATA:  75 year old male status post fall, PEA arrest. History of renal failure, renal transplants. Right pneumothorax treated with chest tube. EXAM: PORTABLE CHEST 1 VIEW COMPARISON:  Portable chest 10/03/2020 and earlier. FINDINGS: Stable lines and tubes. Stable right pigtail chest tube. Trace residual right pneumothorax. Small volume right chest wall gas is stable. Stable cardiac size and mediastinal contours. No pulmonary edema. Dense retrocardiac opacity persists, with atelectasis and small pleural effusions demonstrated by CT 09/27/2020. No new pulmonary opacity. Calcified aortic atherosclerosis. IMPRESSION: 1. Stable lines and tubes. trace residual right pneumothorax and stable small volume right chest wall gas. 2. Stable ventilation. Dense retrocardiac opacity but favor hypoventilation/atelectasis in the setting of cardiomegaly. Electronically Signed  By: Genevie Ann M.D.   On: 10/04/2020 08:22   DG CHEST PORT 1 VIEW  Result Date: 10/03/2020 CLINICAL DATA:  Central line placement. EXAM: PORTABLE CHEST 1 VIEW COMPARISON:  Same day. FINDINGS: Stable cardiomediastinal silhouette. Endotracheal and nasogastric tubes are unchanged in position. Left lung is clear. Right-sided chest tube is  unchanged without evidence of pneumothorax. Stable right subclavian catheter is noted. Interval placement of right internal jugular catheter with distal tip in expected position of the SVC. Stable subcutaneous emphysema is seen over right lateral chest wall. Mild right basilar atelectasis is noted. Bony thorax is unremarkable. IMPRESSION: Interval placement of right internal jugular catheter with distal tip in expected position of the SVC. Otherwise stable support apparatus. No pneumothorax seen. Stable mild right basilar atelectasis. Electronically Signed   By: Marijo Conception M.D.   On: 10/03/2020 18:45   DG CHEST PORT 1 VIEW  Result Date: 10/03/2020 CLINICAL DATA:  Intubated, trauma, CPR EXAM: PORTABLE CHEST 1 VIEW COMPARISON:  Chest radiograph from one day prior. FINDINGS: Right subclavian central venous catheter terminates over the cavoatrial junction. Endotracheal tube tip is 1.7 cm above the carina. Enteric tube enters stomach with the tip not seen on this image. Right pigtail chest tube is stable. Stable cardiomediastinal silhouette with mild cardiomegaly. Small right apical/lateral pneumothorax, approximately 5%, stable to slightly decreased. No left pneumothorax. No pleural effusion. No overt pulmonary edema. Left retrocardiac and patchy right basilar lung opacities are similar. Mild subcutaneous emphysema in the lateral right chest wall is slightly decreased. IMPRESSION: 1. Endotracheal tube tip 1.7 cm above the carina, consider retracting 0.5 cm. Otherwise stable well-positioned support structures. 2. Stable to slightly decreased small right apical/lateral pneumothorax. 3. Stable mild cardiomegaly without overt pulmonary edema. 4. Stable left retrocardiac and patchy right basilar lung opacities, favor atelectasis. Electronically Signed   By: Ilona Sorrel M.D.   On: 10/03/2020 08:34   DG Chest Port 1 View  Result Date: 10/02/2020 CLINICAL DATA:  Tube placement EXAM: PORTABLE CHEST 1 VIEW  COMPARISON:  October 02, 2020 FINDINGS: There is a right-sided chest tube in place. There is a small right-sided pneumothorax which has decreased in size since the prior study. The right subclavian line is well position. The endotracheal tube terminates just above the carina by approximately 1.8 cm. The enteric tube appears to extend below the left hemidiaphragm. The heart size remains enlarged. There is no left-sided pneumothorax. Bibasilar airspace disease and small bilateral pleural effusions are noted. There is subcutaneous gas along the patient's right flank. There are old healed left-sided rib fractures. IMPRESSION: 1. Lines and tubes as above. 2. Persistent but decreased right-sided pneumothorax. 3. Persistent bibasilar airspace disease and small bilateral pleural effusions. Electronically Signed   By: Constance Holster M.D.   On: 10/02/2020 19:42   DG CHEST PORT 1 VIEW  Result Date: 10/02/2020 CLINICAL DATA:  Pneumothorax. EXAM: PORTABLE CHEST 1 VIEW COMPARISON:  Radiograph earlier today, 2 hours prior. FINDINGS: Endotracheal tube tip is 2.7 cm from the carina. Enteric tube in place. Right subclavian central line tip in the SVC. Right pneumothorax is increased in size from earlier today, despite pigtail catheter projecting over the central lower lung zone. Exact pneumothorax measurement difficult due to degree of patient rotation, but likely at least 40%. Cardiomegaly with pericardial calcification, aortic tortuosity and atherosclerosis. Bilateral pleural effusions and adjacent atelectasis. Multiple right rib fractures. Increasing subcutaneous emphysema in the right chest wall. IMPRESSION: 1. Increased size of right pneumothorax from earlier this day,  likely at least 40%. Right pigtail catheter in place. 2. Increasing subcutaneous emphysema in the right chest wall. 3. Unchanged cardiomegaly, bilateral pleural effusions, and adjacent atelectasis. Electronically Signed   By: Keith Rake M.D.   On:  10/02/2020 18:29   DG CHEST PORT 1 VIEW  Result Date: 10/02/2020 CLINICAL DATA:  Chest tube placement for pneumothorax EXAM: PORTABLE CHEST 1 VIEW COMPARISON:  October 02, 2020 study obtained earlier in the day FINDINGS: Chest tube now present on the right with tip directed medially. Persistent pneumothorax on the right without appreciable change. No tension component. Endotracheal tube tip is 2.5 cm above the carina. Central catheter tip is at the cavoatrial junction. Nasogastric tube tip and side port in proximal stomach. There is persistent airspace opacity in the left lower lobe as well as ill-defined opacity in the mid and lower lung regions bilaterally. There is stable cardiomegaly with pulmonary vascularity normal. There is aortic atherosclerosis. No adenopathy. No bone lesions. IMPRESSION: Chest tube present on the right with no appreciable change in pneumothorax currently compared to earlier in the day. This circumstance warrants additional radiographic imaging in 3-4 hours to assess for diminution in pneumothorax size. If patient were to have symptoms suggesting increase in size of pneumothorax, earlier imaging would be warranted. Other tube and catheter positions as described. Airspace opacity bilaterally, most notably in the left lower lobe, persists. Stable cardiomegaly. Aortic Atherosclerosis (ICD10-I70.0). These results will be called to the ordering clinician or representative by the Radiologist Assistant, and communication documented in the PACS or [employer]. Electronically Signed   By: Lowella Grip III M.D.   On: 10/02/2020 15:35   DG CHEST PORT 1 VIEW  Result Date: 10/02/2020 CLINICAL DATA:  Acute respiratory failure with hypoxia EXAM: PORTABLE CHEST 1 VIEW COMPARISON:  Portable exam 1352 hours compared to 09/30/2020 FINDINGS: Tip of endotracheal tube projects 2.4 cm above carina. Nasogastric tube extends into stomach. New RIGHT subclavian central venous catheter with tip  projecting over SVC. Moderate RIGHT pneumothorax identified new since prior study, estimated at approximately 35% No mediastinal shift. Upper normal heart size. Normal mediastinal contours and pulmonary vascularity. Atherosclerotic calcification aorta. BILATERAL pulmonary infiltrates in mid to lower lungs greater on LEFT No pleural effusion or acute osseous findings. IMPRESSION: Moderate RIGHT pneumothorax estimated at 35% without mediastinal shift. Increased infiltrates in the mid to lower lung since previous study. Critical Value/emergent results were called by telephone at the time of interpretation on 10/02/2020 at 1417 hours to provider DR. MATTHEW HUNSUCKER , who verbally acknowledged these results. Electronically Signed   By: Lavonia Michael M.D.   On: 10/02/2020 14:18   DG Chest Portable 1 View  Result Date: 09/29/2020 CLINICAL DATA:  OG tube placement. EXAM: PORTABLE CHEST 1 VIEW COMPARISON:  Single-view of the chest earlier today and 05/26/2020. FINDINGS: New OG tube is in place with the side port just above the gastroesophageal junction. Recommend advancement 2-3 cm. Right subclavian catheter is again seen. Endotracheal tube tip remains 1.3 cm above the carina. There is cardiomegaly. Aortic atherosclerosis. No focal airspace disease. IMPRESSION: NG tube tip is just within the stomach. Recommend advancement of 2-3 cm. Endotracheal tube tip remains 1.3 cm above the carina. The tube could be pulled back 1-2 cm for better positioning. Electronically Signed   By: Inge Rise M.D.   On: 10/18/2020 12:28   DG Chest Port 1 View  Result Date: 09/27/2020 CLINICAL DATA:  75 year old male with chest pain beginning at 0200 hours, found unresponsive at  0900 hours. Status post CPR. EXAM: PORTABLE CHEST 1 VIEW COMPARISON:  Portable chest 08/31/2020 and earlier. FINDINGS: Portable AP supine view at 1059 hours. Endotracheal tube tip in good position about 18 mm above the carina. Left chest resuscitation pads. Lung  volumes within normal limits. No pneumothorax, pleural effusion or confluent pulmonary opacity identified on this supine view which excludes the left lateral lung base. Stable cardiac size and mediastinal contours. Calcified aortic atherosclerosis. There are chronic left posterior rib fractures. No acute displaced rib fracture identified. IMPRESSION: 1. Endotracheal tube tip in good position. 2.  No acute cardiopulmonary abnormality identified. Electronically Signed   By: Genevie Ann M.D.   On: 10/12/2020 11:24   DG Abd Portable 1V  Result Date: 10/02/2020 CLINICAL DATA:  Feeding tube placement EXAM: PORTABLE ABDOMEN - 1 VIEW COMPARISON:  October 02, 2020 FINDINGS: The enteric tube now projects over the gastric body/fundus. The tip is pointed laterally. The visualized bowel gas pattern is nonobstructive and nonspecific. 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 IMPRESSION: Feeding tube tip projects over the stomach. Electronically Signed   By: Constance Holster M.D.   On: 10/02/2020 19:44   VAS [place] UPPER EXTREMITY ARTERIAL DUPLEX  Result Date: 11-01-2020 UPPER EXTREMITY DUPLEX STUDY Indications: Patient complains of No radial pulse Left. History:     Patient has a history ofPT admitted as Level 1 trauma unresponsive              after a fall. Degloving incident of part of left forearm.  Risk Factors: Diabetes, prior MI. Comparison Study: No previous exam Performing Technologist: Vonzell Schlatter RVT  Examination Guidelines: A complete evaluation includes B-mode imaging, spectral Doppler, color Doppler, and power Doppler as needed of all accessible portions of each vessel. Bilateral testing is considered an integral part of a complete examination. Limited examinations for reoccurring indications may be performed as noted.  Left Doppler Findings:  +--------------+----------+----------+-------------+---------------------------+ Site          PSV (cm/s)Waveform  Stenosis     Comments                    +--------------+----------+----------+-------------+---------------------------+ Subclavian Mid72        triphasic                                          +--------------+----------+----------+-------------+---------------------------+ Axillary      70        triphasic                                          +--------------+----------+----------+-------------+---------------------------+ Brachial Prox 75        triphasic                                          +--------------+----------+----------+-------------+---------------------------+ Brachial Mid  282       monophasic>50% stenosis                            +--------------+----------+----------+-------------+---------------------------+ Brachial Dist 369       monophasic>50%  stenosis                            +--------------+----------+----------+-------------+---------------------------+ Radial Prox   20        monophasic             Segmental occlusion just                                                   below origin of RA          +--------------+----------+----------+-------------+---------------------------+ Ulnar Prox    33        monophasic                                         +--------------+----------+----------+-------------+---------------------------+  Summary:  Left: Total occlusion visualized in the left upper extremity.       Obstruction noted in the radial artery.       Brachial artery has varying degrees of stenosis from mid to       distal with monophasic signals. Collaterals noted.       Radial artery has a short segment occlusion then       reconstitutes.       Unable to visualize [employer]. *See table(s) above for measurements and observations. Electronically signed by [name] MD on Oct 22, 2020 at 4:11:56 PM.     Final    ECHOCARDIOGRAM LIMITED  Result Date: 10/02/2020    ECHOCARDIOGRAM LIMITED REPORT   Patient Name:   [name] Date of Exam: 10/02/2020 Medical Rec #:  [id number]       Height:       71.0 in Accession #:    [id number]      Weight:       192.2 lb Date of Birth:  Feb 26, 1946      BSA:          2.073 m Patient Age:    89 years        BP:           122/46 mmHg Patient Gender: M               HR:           84 bpm. Exam Location:  Inpatient Procedure: Limited Echo, Cardiac Doppler and Color Doppler Indications:    Cardiac arrest  History:        Patient has no prior history of Echocardiogram examinations.                 CHF, Arrythmias:Cardiac Arrest; Risk Factors:Hypertension and                 obesity. CKD, renal trransplant, pleural effusion.  Sonographer:    Dustin Flock Referring Phys: [phone] MATTHEW R HUNSUCKER  Sonographer Comments: Echo performed with patient supine and on artificial respirator. Patient had echo at another hospital IMPRESSIONS  1. Left ventricular ejection fraction, by estimation, is 50 to 55%. The left ventricle has low normal function. The left ventricle has no regional wall motion abnormalities. There is severe left ventricular hypertrophy. 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 Right ventricular  systolic function is mildly reduced. The right ventricular size is normal. Tricuspid regurgitation signal is inadequate for assessing PA pressure.  3. The mitral valve is abnormal. Mild to moderate mitral annular calcification. No evidence of mitral valve regurgitation.  4. The aortic valve was not well visualized. Aortic valve regurgitation is not visualized. No aortic stenosis is present.  5. Aortic dilatation noted. There is mild dilatation of the ascending aorta, measuring 38 mm. FINDINGS  Left Ventricle: Left ventricular ejection  fraction, by estimation, is 50 to 55%. The left ventricle has low normal function. The left ventricle has no regional wall motion abnormalities. The left ventricular internal cavity size was small. There is severe left ventricular hypertrophy. Right Ventricle: The right ventricular size is normal. No increase in right ventricular wall thickness. Right ventricular systolic function is mildly reduced. Tricuspid regurgitation signal is inadequate for assessing PA pressure. Pericardium: Trivial pericardial effusion is present. Mitral Valve: The mitral valve is abnormal. Mild to moderate mitral annular calcification. Tricuspid Valve: The tricuspid valve is normal in structure. Tricuspid valve regurgitation is not demonstrated. Aortic Valve: The aortic valve was not well visualized. Aortic valve regurgitation is not visualized. No aortic stenosis is present. Aortic valve peak gradient measures 6.9 mmHg. Pulmonic Valve: The pulmonic valve was not well visualized. Pulmonic valve regurgitation is not visualized. Aorta: The aortic root is normal in size and structure and aortic dilatation noted. There is mild dilatation of the ascending aorta, measuring 38 mm. IAS/Shunts: No atrial level shunt detected by color flow Doppler. Additional Comments: There is a small pleural effusion. LEFT VENTRICLE PLAX 2D LVIDd:         3.40 cm  Diastology LVIDs:         2.75 cm  LV e' medial:    5.33 cm/s LV PW:         1.40 cm  LV E/e' medial:  15.9 LV IVS:        1.50 cm  LV e' lateral:   9.79 cm/s LVOT diam:     2.50 cm  LV E/e' lateral: 8.7 LVOT Area:     4.91 cm  RIGHT VENTRICLE RV Basal diam:  2.70 cm RV S prime:     6.20 cm/s LEFT ATRIUM         Index LA diam:    3.30 cm 1.59 cm/m  AORTIC VALVE AV Vmax:      131.00 cm/s AV Peak Grad: 6.9 mmHg  AORTA Ao Root diam: 3.50 cm MITRAL VALVE MV Area (PHT): 3.48 cm    SHUNTS MV Decel Time: 218 msec    Systemic Diam: 2.50 cm MV E velocity: 84.80 cm/s MV A velocity: 57.40 cm/s MV E/A ratio:   1.48 Oswaldo Milian MD Electronically signed by Oswaldo Milian MD Signature Date/Time: 10/02/2020/10:24:05 AM    Final    CT ANGIO HEAD CODE STROKE  Result Date: 10/08/2020 CLINICAL DATA:  75 year old male with chest pain beginning at 0200 hours, found unresponsive at 0900 hours. Status post CPR. EXAM: CT ANGIOGRAPHY HEAD AND NECK TECHNIQUE: Multidetector CT imaging of the head and neck was performed using the standard protocol during bolus administration of intravenous contrast. Multiplanar CT image reconstructions and MIPs were obtained to evaluate the vascular anatomy. Carotid stenosis measurements (when applicable) are obtained utilizing NASCET criteria, using the distal internal carotid diameter as the denominator. CONTRAST:  139mL OMNIPAQUE IOHEXOL 350 MG/ML SOLN COMPARISON:  Head face and cervical spine CT reported separately today. FINDINGS: CTA NECK Skeleton: Cervical spine detailed  separately today. Rib fractures also detailed separately today on chest CT (please see that report). Carious dentition. Upper chest: Detailed separately on chest CT today (please see that report). Other neck: Endotracheal tube in place into the chest. Small volume fluid in the pharynx. Patchy contusion/hematoma affecting the right parotid gland and right postauricular soft tissues, see face CT reported separately. No other acute finding identified in the neck. Aortic arch: 3 vessel arch configuration. Calcified aortic atherosclerosis. Right carotid system: Tortuous brachiocephalic artery and proximal right CCA. Mild right CCA calcified plaque without stenosis. Mild to moderate calcified plaque at the right carotid bifurcation without stenosis. Left carotid system: Similar tortuosity and mild calcified plaque affecting the left CCA and left carotid bifurcation with no stenosis to the skull base. Vertebral arteries: Mildly tortuous proximal right subclavian artery with mild plaque and no stenosis. Heavily calcified  right vertebral artery origin which remains patent. There is moderate to severe right V1 segment stenosis (series 6, image 120). But the right vertebral artery is dominant and remains patent to the skull base without V2 or V3 segment stenosis. Proximal left subclavian artery calcified plaque is mild without stenosis. There is calcified plaque at the left vertebral artery origin but no associated stenosis. Non dominant left vertebral with tortuous V1 segment. Patent left vertebral to the skull base without additional plaque or stenosis. CTA HEAD Posterior circulation: Chronically dolichoectatic dominant distal right vertebral artery with circumferential calcified plaque but no significant stenosis. Right PICA origin remains normal. Non dominant left vertebral is diminutive beyond the patent left PICA origin but remains patent to the vertebrobasilar junction. Patent basilar artery with mild tortuosity, no stenosis. SCA and PCA origins are normal. Posterior communicating arteries are diminutive or absent. Bilateral PCA branches are patent, with mild irregularity on the right. Anterior circulation: Patent and mildly dolichoectatic bilateral ICA siphons with abundant calcified plaque, but no significant siphon stenosis. Normal ophthalmic artery origins. Patent carotid termini. Patent MCA and ACA origins. Dominant right A1 segment. Anterior communicating artery is normal. Bilateral ACA branches are normal aside from mild tortuosity and irregularity. Left MCA M1 segment and bifurcation are patent without stenosis. Right MCA M1 segment and bifurcation are patent without stenosis. Bilateral MCA branches are normal aside from tortuosity. Venous sinuses: Early contrast timing, but the major dural venous sinuses appear to be patent. Anatomic variants: Dominant right vertebral artery. Dominant right ACA A1. Review of the MIP images confirms the above findings IMPRESSION: 1. Negative for large vessel occlusion. Widespread  atherosclerosis in the head and neck. These results were discussed with Dr. Reather Laurence on 10/16/2020 at 1130 hours. 2. 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 But no other significant arterial stenosis in the head or neck. Generalized arterial tortuosity. 3. Acute findings in the chest reported separately on Chest CT today (please see that report). Electronically Signed   By: Genevie Ann M.D.   On: 10/19/2020 11:52   CT MAXILLOFACIAL WO CONTRAST  Result Date: 10/03/2020 CLINICAL DATA:  75 year old male with chest pain beginning at 0200 hours, found unresponsive at 0900 hours. Status post CPR. EXAM: CT MAXILLOFACIAL WITHOUT CONTRAST TECHNIQUE: Multidetector CT imaging of the maxillofacial structures was performed. Multiplanar CT image reconstructions were also generated. COMPARISON:  Head and cervical spine CT today reported separately. FINDINGS: Osseous: Carious dentition.  Mandible intact and normally aligned. Subtle minimally displaced fracture of the posterior wall of the right maxillary sinus is new compared to 05/26/2020 (series  4, image 58). No other maxilla, zygoma, or nasal bone fracture identified. Osteopenia. Central skull base appears intact. Orbits: Intact orbital walls. Left periorbital and forehead superficial hematoma with trace soft tissue gas (series 3, image 82). Underlying left globe and intraorbital soft tissues remain normal. Right orbits soft tissues appear stable and normal. Sinuses: Trace fluid layering in the right maxillary sinus. Other paranasal sinuses and mastoids are stable and well pneumatized. Soft tissues: In addition to left forehead and periorbital soft tissue injury there is confluent stranding and hazy opacity involving the right parotid gland and overlying subcutaneous fat. Similar subcutaneous fat stranding overlying the superior right sternocleidomastoid muscle compatible with posttraumatic hematoma or contusion.  Underlying right middle ear and mastoids remain pneumatized. Intubated, small volume fluid in the pharynx. Diffuse neck and scalp vessel calcified atherosclerosis. Limited intracranial: Reported separately today. IMPRESSION: 1. Subtle fracture of the posterior wall of the right maxillary sinus is new since September and likely acute. Trace fluid within the sinus. 2. Hematoma/contusion involving the right parotid gland and regional subcutaneous tissues. No underlying fracture. 3. Left forehead and periorbital hematoma without underlying fracture. The above were discussed by telephone with Dr. Reather Laurence at 12:19 hours. Electronically Signed   By: Genevie Ann M.D.   On: 10/18/2020 12:21    Microbiology Recent Results (from the past 240 hour(s))  Culture, respiratory (non-expectorated)     Status: None   Collection Time: 10/08/20  4:02 AM   Specimen: Tracheal Aspirate; Respiratory  Result Value Ref Range Status   Specimen Description TRACHEAL ASPIRATE  Final   Special Requests Normal  Final   Gram Stain   Final    ABUNDANT WBC PRESENT, PREDOMINANTLY PMN ABUNDANT GRAM NEGATIVE RODS MODERATE GRAM POSITIVE COCCI RARE GRAM POSITIVE RODS Performed at [facility], [place] [address] [place], [place] [address]    Culture FEW PSEUDOMONAS AERUGINOSA  Final   Report Status 10/10/2020 FINAL  Final   Organism ID, Bacteria PSEUDOMONAS AERUGINOSA  Final      Susceptibility   Pseudomonas aeruginosa - MIC*    CEFTAZIDIME 4 SENSITIVE Sensitive     CIPROFLOXACIN <=0.25 SENSITIVE Sensitive     GENTAMICIN <=1 SENSITIVE Sensitive     IMIPENEM 1 SENSITIVE Sensitive     CEFEPIME 8 SENSITIVE Sensitive     * FEW PSEUDOMONAS AERUGINOSA  Culture, Urine     Status: None   Collection Time: 10/08/20  4:22 AM   Specimen: Urine, Catheterized  Result Value Ref Range Status   Specimen Description URINE, CATHETERIZED  Final   Special Requests NONE  Final   Culture   Final    NO GROWTH Performed at [facility], [place] [address] [place], [place] [address]    Report Status 10/09/2020 FINAL  Final  Culture, blood (routine x 2)     Status: Abnormal   Collection Time: 10/08/20  8:20 AM   Specimen: BLOOD  Result Value Ref Range Status   Specimen Description BLOOD LEFT ANTECUBITAL  Final   Special Requests   Final    BOTTLES DRAWN AEROBIC ONLY Blood Culture results may not be optimal due to an inadequate volume of blood received in culture bottles   Culture  Setup Time   Final    GRAM POSITIVE COCCI IN CLUSTERS AEROBIC BOTTLE ONLY CRITICAL RESULT CALLED TO, READ BACK BY AND VERIFIED WITHJiles Garter [facility] 2112 10/10/20 A BROWNING    Culture (A)  Final    STAPHYLOCOCCUS EPIDERMIDIS THE  SIGNIFICANCE OF ISOLATING THIS ORGANISM FROM A SINGLE SET OF BLOOD CULTURES WHEN MULTIPLE SETS ARE DRAWN IS UNCERTAIN. PLEASE NOTIFY THE MICROBIOLOGY DEPARTMENT WITHIN ONE WEEK IF SPECIATION AND SENSITIVITIES ARE REQUIRED. Performed at [facility], [place] [address] [place], [place] [address]    Report Status 10/12/2020 FINAL  Final  Blood Culture ID Panel (Reflexed)     Status: Abnormal   Collection Time: 10/08/20  8:20 AM  Result Value Ref Range Status   Enterococcus faecalis NOT DETECTED NOT DETECTED Final   Enterococcus Faecium NOT DETECTED NOT DETECTED Final   Listeria monocytogenes NOT DETECTED NOT DETECTED Final   Staphylococcus species DETECTED (A) NOT DETECTED Final    Comment: CRITICAL RESULT CALLED TO, READ BACK BY AND VERIFIED WITHJiles Garter [facility] 2112 10/10/20 A BROWNING    Staphylococcus aureus (BCID) NOT DETECTED NOT DETECTED Final   Staphylococcus epidermidis DETECTED (A) NOT DETECTED Final    Comment: Methicillin (oxacillin) resistant coagulase negative staphylococcus. Possible blood culture contaminant (unless isolated from more than one blood culture draw or clinical case suggests pathogenicity). No antibiotic treatment is indicated for blood  culture contaminants.     Staphylococcus lugdunensis NOT DETECTED NOT DETECTED Final   Streptococcus species NOT DETECTED NOT DETECTED Final   Streptococcus agalactiae NOT DETECTED NOT DETECTED Final   Streptococcus pneumoniae NOT DETECTED NOT DETECTED Final   Streptococcus pyogenes NOT DETECTED NOT DETECTED Final   A.calcoaceticus-baumannii NOT DETECTED NOT DETECTED Final   Bacteroides fragilis NOT DETECTED NOT DETECTED Final   Enterobacterales NOT DETECTED NOT DETECTED Final   Enterobacter cloacae complex NOT DETECTED NOT DETECTED Final   Escherichia coli NOT DETECTED NOT DETECTED Final   Klebsiella aerogenes NOT DETECTED NOT DETECTED Final   Klebsiella oxytoca NOT DETECTED NOT DETECTED Final   Klebsiella pneumoniae NOT DETECTED NOT DETECTED Final   Proteus species NOT DETECTED NOT DETECTED Final   Salmonella species NOT DETECTED NOT DETECTED Final   Serratia marcescens NOT DETECTED NOT DETECTED Final   Haemophilus influenzae NOT DETECTED NOT DETECTED Final   Neisseria meningitidis NOT DETECTED NOT DETECTED Final   Pseudomonas aeruginosa NOT DETECTED NOT DETECTED Final   Stenotrophomonas maltophilia NOT DETECTED NOT DETECTED Final   Candida albicans NOT DETECTED NOT DETECTED Final   Candida auris NOT DETECTED NOT DETECTED Final   Candida glabrata NOT DETECTED NOT DETECTED Final   Candida krusei NOT DETECTED NOT DETECTED Final   Candida parapsilosis NOT DETECTED NOT DETECTED Final   Candida tropicalis NOT DETECTED NOT DETECTED Final   Cryptococcus neoformans/gattii NOT DETECTED NOT DETECTED Final   Methicillin resistance mecA/C DETECTED (A) NOT DETECTED Final    Comment: CRITICAL RESULT CALLED TO, READ BACK BY AND VERIFIED WITHJiles Garter [facility] 2112 10/10/20 A BROWNING Performed at [facility], [address] [place], [place] [address]   Culture, blood (routine x 2)     Status: None   Collection Time: 10/08/20  8:27 AM   Specimen: BLOOD RIGHT ARM  Result Value Ref Range Status   Specimen  Description BLOOD RIGHT ARM  Final   Special Requests   Final    BOTTLES DRAWN AEROBIC ONLY Blood Culture results may not be optimal due to an inadequate volume of blood received in culture bottles   Culture   Final    NO GROWTH 5 DAYS Performed at [facility], [place] [address] [place], [place] [address]    Report Status 10/13/2020 FINAL  Final  Body fluid culture  Status: None (Preliminary result)   Collection Time: 10/11/20 11:30 AM   Specimen: Pleura; Body Fluid  Result Value Ref Range Status   Specimen Description PLEURAL  Final   Special Requests LEFT  Final   Gram Stain   Final    FEW WBC PRESENT, PREDOMINANTLY PMN ABUNDANT GRAM POSITIVE COCCI ABUNDANT GRAM NEGATIVE RODS Performed at [facility], [address] [place], [place] [address]    Culture   Final    ABUNDANT PSEUDOMONAS AERUGINOSA CULTURE REINCUBATED FOR BETTER GROWTH MIXED ANAEROBIC FLORA PRESENT.  CALL LAB IF FURTHER IID REQUIRED.    Report Status PENDING  Incomplete   Organism ID, Bacteria PSEUDOMONAS AERUGINOSA  Final      Susceptibility   Pseudomonas aeruginosa - MIC*    CEFTAZIDIME 4 SENSITIVE Sensitive     CIPROFLOXACIN <=0.25 SENSITIVE Sensitive     GENTAMICIN <=1 SENSITIVE Sensitive     IMIPENEM 1 SENSITIVE Sensitive     PIP/TAZO 8 SENSITIVE Sensitive     CEFEPIME 2 SENSITIVE Sensitive     * ABUNDANT PSEUDOMONAS AERUGINOSA    Lab Basic Metabolic Panel: Recent Labs  Lab 10/12/20 1619 10/12/20 2323 10/13/20 0014 10/13/20 0314 10/13/20 0501 10/13/20 1623 16-Oct-2020 0500 10/16/2020 1638  NA 133* 131*   < > 134* 135 134* 135 138  K 5.0 4.5   < > 4.6 4.3 4.8 4.8 5.0  CL 101 100  --  102  --  103 103 105  CO2 20* 19*  --  20*  --  19* 20* 15*  GLUCOSE 160* 135*  --  133*  --  103* 184* 53*  BUN 16 15  --  14  --  14 17 18   CREATININE 1.00 0.97  --  0.96  --  0.98 1.05 1.16  CALCIUM 7.0* 6.7*  --  7.0*  --  7.1* 7.0* 7.0*  PHOS 4.3  --   --  2.8  --  3.1 3.0 4.4   < > = values  in this interval not displayed.   Liver Function Tests: Recent Labs  Lab 10/13/20 0314 10/13/20 1623 10/16/20 0500 10-16-20 1039 10/16/20 1638  AST  --   --   --  54*  --   ALT  --   --   --  30  --   ALKPHOS  --   --   --  174*  --   BILITOT  --   --   --  6.2*  --   PROT  --   --   --  4.2*  --   ALBUMIN 1.6* 1.7* 1.5* 1.6* 1.5*   No results for input(s): LIPASE, AMYLASE in the last 168 hours. No results for input(s): AMMONIA in the last 168 hours. CBC: Recent Labs  Lab 10/10/20 0423 10/11/20 0252 10/12/20 0305 10/12/20 0837 10/13/20 0014 10/13/20 0314 10/13/20 0501 10/13/20 1154 2020/10/16 0500  WBC 31.9* 28.1* 17.4*  --   --  18.2*  --   --  17.1*  HGB 7.9* 7.2* 6.7* 7.4* 7.8* 7.6* 8.2*  --  7.2*  HCT 24.7* 24.4* 21.7* 23.4* 23.0* 26.3* 24.0*  --  24.3*  MCV 91.1 94.6 94.8  --   --  96.3  --   --  96.8  PLT 47* 34* 26*  --   --  21*  --  19* 23*   Cardiac Enzymes: No results for input(s): CKTOTAL, CKMB, CKMBINDEX, TROPONINI in the last 168 hours. Sepsis Labs: Recent Labs  Lab 10/11/20 0252 10/12/20 0305 10/12/20 1321 10/12/20 1619 10/13/20 0314 10/13/20 1154 Nov 08, 2020 0500  WBC 28.1* 17.4*  --   --  18.2*  --  17.1*  LATICACIDVEN  --   --  6.2* 6.3*  --  6.4*  --     Procedures/Operations  CVL   Takeira Yanes L Kristee Angus 10/15/2020, 11:49 AM

## 2020-10-21 NOTE — Progress Notes (Signed)
Called and spoke w/Valerie Manetta (spouse) to verify if she still wanted an autopsy.   Per wife, she does not want an autopsy.   Called [employer] and notified Glenetta Hew since she is calling CDS for [place].

## 2020-10-21 NOTE — Procedures (Signed)
Extubation Procedure Note  Patient Details:   Name: [name] DOB: 1946-06-04 MRN: [id number]   Airway Documentation:    Vent end date: 11-02-2020 Vent end time: 1835   Evaluation  O2 sats: stable throughout Complications: No apparent complications Patient did tolerate procedure well. Bilateral Breath Sounds: Rhonchi,Diminished   No,pt could not speak post extubation.  Pt extubated to room air per physician's order and in accordance with the family's wishes.  Earney Navy 11/02/20, 6:36 PM

## 2020-10-21 NOTE — Progress Notes (Signed)
Patients time of death was 1843 on 11-01-2020. Heart and lung sounds auscultated for one full minute with Junius Argyle, RN with no sounds heard. Patients daughter and wife at bedside at time of death.  Donata Duff, Medical Examiner, called and notified of death and patient is NOT an ME case.

## 2020-10-21 NOTE — Plan of Care (Signed)
  Problem: Activity: Goal: Risk for activity intolerance will decrease Outcome: Not Progressing Note: Pt is currently too unstable and critically ill to mobilize at this time.   Problem: Nutrition: Goal: Adequate nutrition will be maintained Outcome: Not Progressing Note: Pt not tolerating tube feeds due to emesis. Currently on trickle feeds. No emesis overnight. Will look into increasing tube feed rates today.   Problem: Elimination: Goal: Will not experience complications related to urinary retention Outcome: Not Applicable

## 2020-10-21 NOTE — Progress Notes (Signed)
PCCM Goals of Care Discussion and Advanced Care Planning:   Date: Nov 04, 2020   Present Parties: Patient's 2 daughters and wife  What was discussed: We discussed his current medical comorbidities.  Multiorgan failure, shock state requiring CVVHD ongoing ischemic limbs and rising lactic acid.  With all of his current medical comorbidities I suspect that this is uniformly fatal as he has been unresponsive to escalating vasopressor doses.  This was explained to the patient's family.  We discussions of the patient's wishes and next best steps they have agreed to full DNR status and consideration for transition to comfort care with liberation from mechanical support.  Orders have been placed.  Outcome:   Full DNR Withdrawal of mechanical life support order set has been placed Morphine for comfort Versed as needed. I suspect hospital death on comfort care  16 mins of time was spent discussing the goals of care, advanced care planning options such as code status as well as do not resuscitate forms.

## 2020-10-21 NOTE — Progress Notes (Addendum)
NAME:  [name], MRN:  [id number], DOB:  [birth year], LOS: 34 ADMISSION DATE:  09/22/2020, CONSULTATION DATE:  1/12 REFERRING MD:  Dr. Rogene Houston, CHIEF COMPLAINT:  Cardiac arrest   Brief History:  75 year old male with renal transplant history admitted 1/12 after PEA arrest 12 minutes.  PMH significant for CKD (previously on HD, but is s/p renal transplant in 2005 at [facility]), AF not on [facility], who presented to [facility] ED after cardiac arrest athome 1/12. Apparently 1/11 in the evening hours he developed chest pain and dyspnea, but proceeded to go to bed. Then 1/12 AM his wife awoke around 0930 and found him to be unresponsive and foaming at the mouth. He was still breathing. As she called EMS he fell to the floor. Upon EMS arrival the patient was pulseless and ACLS was initiated. CPR was performed for approximately 12 minutes with two doses of epinephrine. He suffered injury to his L periorbit at some point during all this, raising concern for head injury and he was treated as a level one trauma in ED. Upon arrival he was unresponsive and was immediately intubated. He was emergently taken for pan-CT scan. No operable injury was identified. PCCM asked to admit. ICU course complicated by progressive acidosis / AKI requiring CRRT, prolonged mechanical ventilation, shock and anemia.  Past Medical History:   Paroxysmal AF (no AC)  ESRD S/p Renal transplant x2  HFrEF  HTN  HLD  T2DM  PAD  Significant Hospital Events:  1/12 admit for PEA arrest 1/13 Pneumothorax development with pigtail catheter placed. Patient had increasing pressor requirements.  1/14 CRRT initiated 1/18 Change chest tube to water seal 1/19 New Lt pneumothorax; changed to no CPR/defibrillation 1/20 Transfuse PRBC 1/23 Last 48 hours max pressor support. CRRT fluid removal was paused, net positive.  Family meeting held. 1/24 Worsening acidosis despite HD, not pulling fluid on HD. Hypothermic despite bair hugger. Trickle  TF started.    Consults:  Neurosurgery (S/o) Trauma surgery (S/o) Nephrology Neurology  Palliative care  Procedures:  CVL R subclavian 1/12 >> ETT 1/12 >> Rt pigtail catheter 1/14 >> R IJ HD cath 1/14 >>  Lt pigtail catheter 1/19 >>   Significant Diagnostic Tests:  1/12 CTA head > no large vessel occlusion. Mod-severe stenosis of the dominant R vert.  1/12 CT Maxillofacial > Subtle fracture of the posterior wall of the right maxillary sinus is new since September and likely acute. Trace fluid within the sinus. Hematoma/contusion involving the right parotid gland and regional subcutaneous tissues. Left forehead and periorbital hematoma without underlying fracture. 1/12 CT chest/abd/pelv > Numerous acute rib fractures: Right ribs 1 through 7 (with rib 2 through 4 flail segment) and left ribs 2 through 7. Mild intercostal hematoma, but no pneumothorax, hemothorax or pulmonary contusion. Low-density layering pleural effusions appear mildly increased from last month. Chronic atelectasis. MRI Brain 1/14 >> scattered small acute to early subacute cerebral and cerebellar infarcts, few scattered chronic cerebral microhemorrhages bilaterally, small amount of petechial blood associated with small left occipital infarct, possible more global hypoxic ischemic injury given subtle diffusion-weighted signal hyperintensity involving cortex posteriorly CT Head wo Contrast 1/22 Redemonstration prior small right parietal and bilateral occipital infarcts. No acute infarct or hemorrhage  Micro Data:  1/12 COVID/Flu >> negative 1/12 Urine Cultures > Negative 1/13 Blood Cultures > negative 1/19 Sputum >> Pseudomonas >> pansensitive 1/19 Blood >> 1/2 with staph epidermis   1/19 urine >> negative 1/22 L Pleural culture >> abundant pseudomonas >> pan  sensitive   Antimicrobials:  Ceftriaxone 1/13 >> 1/14; 1/16 >> 1/18  Cefepime 1/14 >> 1/16 Cefepime 1/18 >> Linezolid 1/22 >> 1/23 Metronidazole 1/124 >>    Interim History / Subjective:  Vent - PCV PEEP 8, 50% fiO2  On levophed - weaned to 37 mcg / vasopressin  Glucose improved, no emesis on 20 ml TF  I/O 0 urine, 231 ml removed with CRRT, +2.6L in last 24 hours  Objective   Blood pressure (!) 108/46, pulse 79, temperature (!) 96.1 F (35.6 C), temperature source Axillary, resp. rate (!) 24, height 5\' 11"  (1.803 m), weight 84.3 kg, SpO2 100 %.    Vent Mode: PCV FiO2 (%):  [50 %-60 %] 50 % Set Rate:  [28 bmp] 28 bmp PEEP:  [8 cmH20] 8 cmH20 Plateau Pressure:  [24 cmH20-30 cmH20] 28 cmH20   Intake/Output Summary (Last 24 hours) at Oct 16, 2020 1131 Last data filed at 10-16-2020 1100 Gross per 24 hour  Intake 3129.94 ml  Output 412 ml  Net 2717.94 ml   Filed Weights   10/12/20 0117 10/13/20 0304 2020-10-16 0455  Weight: 75.9 kg 81.3 kg 84.3 kg   Physical Exam: General: critically ill appearing adult male lying in bed on vent HEENT: MM pink/moist, eyes open, bloody sclera L>R, laceration over left eye, pupils reactive  Neuro: on fentanyl gtt, no response to voice, occasional spontaneous movement of head CV: s1s2 irr irr, AF in 70's, no m/r/g PULM: agonal type / pulling respirations, coarse rhonchi bilaterally, bilateral chest tubes - left with grey yellow fluid  GI: soft, bsx4 active  Extremities: warm/dry, generalized edema, changes c/w chronic venous stasis LE's, dusky toes L>R, wounds to hands from fall prior to admit   Skin: thin, fragile skin, multiple areas bruising / skin tears  Resolved Hospital Problem list     Assessment & Plan:   Acute anoxic encephalopathy in setting of PEA cardiac arrest and critical illness Bilateral acute subcortical infarcts (possibly embolic) -appreciate Neurology evaluation  -hold ASA 1/24 with platelets ~ 20, no acute bleeding  -limit sedating medications as able, fentanyl gtt  -RASS goal 0 to -1  -hold tacrolimus with CNS toxicity   Acute hypoxic respiratory failure with compromised airway  2nd to PEA arrest, acute pulmonary edema and aspiration pneumonia. Rt pneumothorax after CPR. Spontaneous Lt pneumothorax on 1/19. Pseudomonas pneumonia complicated by left empyema -PCV as rest mode -wean PEEP / fiO2 for sats >90% -not a candidate for PSV weaning due to hemodynamic instability / neurologic status  -bilateral chest tubes, follow drainage   Septic shock secondary to Pseudomonas pneumonia complicated by empyema 1 of 4 cultures with S. Epidermis, likely contaminant Dusky Toes / Pressor Dependent Absent L Radial Pulse, + L Ulnar  -D13/x abx -continue renal dosing cefepime, flagyl added per ID with mixed resp culture (final with pan-sens pseudomonas only)  -appreciate ID evaluation  -follow intermittent lactic acid (persistently elevated, jaundiced, ? Shock liver) -allow positive balance -assess arterial duplex of LUE, but not an anticoagulation candidate currently   AKI in setting of shock from PEA arrest. CKD s/p renal transplant. -appreciate Nephrology  -continue CVVHD, not pulling volume  -hold cellcept, prednisone  -stress dose steroids  PEA arrest PAF, chronic combined CHF. -tele monitoring  -hold anticoagulation due to thrombocytopenia   DM type 2 poorly controlled with hyperglycemia. Having episodes of hypoglycemia on 1/20. -SSI -stop D10  -continue TF re-challenge at 20 ml/hr, no titration for now  R, L rib fx: flail segments by CT  on the right. R maxillary sinus wall fracture. -pain control with fentanyl  Anemia of critical illness and chronic disease. Thrombocytopenia. S/p PRBC 1/19 -ASA on hold since 1/24  -transfuse for Hgb <7% -HIT panel negative  -DIC panel with no schistocytes, fibrinogen >500  Goals of care. -Palliative care following -Prognosis for meaningful recovery seems grim -Family meeting with Neurology and Critical Care on 1/23. After discussion regarding goals of care, family has expressed that they do not "want to give up" or  "have any regrets" as they have seen Mr. Shiraishi fight through prior illnesses. They express full understanding how critically ill he is and wish to continue current care. They do not want him to have chest compressions if he were to naturally pass but would like to continue to receive aggressive care. They are unsure if they would pursue tracheostomy but prefer to discuss it if/when he survives to that point. -Code status remains DNR  Best practice (evaluated daily)  Diet: Tube Feeds  DVT prophylaxis: SQH GI prophylaxis: Pepcid Mobility: BR Disposition: FULL Goals of care: full code Family: Will call family 1/25 for update.   Labs    CMP Latest Ref Rng & Units November 13, 2020 10/13/2020 10/13/2020  Glucose 70 - 99 mg/dL 184(H) 103(H) -  BUN 8 - 23 mg/dL 17 14 -  Creatinine 0.61 - 1.24 mg/dL 1.05 0.98 -  Sodium 135 - 145 mmol/L 135 134(L) 135  Potassium 3.5 - 5.1 mmol/L 4.8 4.8 4.3  Chloride 98 - 111 mmol/L 103 103 -  CO2 22 - 32 mmol/L 20(L) 19(L) -  Calcium 8.9 - 10.3 mg/dL 7.0(L) 7.1(L) -  Total Protein 6.5 - 8.1 g/dL - - -  Total Bilirubin 0.3 - 1.2 mg/dL - - -  Alkaline Phos 38 - 126 U/L - - -  AST 15 - 41 U/L - - -  ALT 0 - 44 U/L - - -    CBC Latest Ref Rng & Units 11/13/2020 10/13/2020 10/13/2020  WBC 4.0 - 10.5 K/uL 17.1(H) - -  Hemoglobin 13.0 - 17.0 g/dL 7.2(L) - 8.2(L)  Hematocrit 39.0 - 52.0 % 24.3(L) - 24.0(L)  Platelets 150 - 400 K/uL 23(LL) 19(LL) -    ABG    Component Value Date/Time   PHART 7.297 (L) 10/13/2020 0501   PCO2ART 39.8 10/13/2020 0501   PO2ART 71 (L) 10/13/2020 0501   HCO3 19.5 (L) 10/13/2020 0501   TCO2 21 (L) 10/13/2020 0501   ACIDBASEDEF 7.0 (H) 10/13/2020 0501   O2SAT 92.0 10/13/2020 0501    CBG (last 3)  Recent Labs    10/13/20 2353 11-13-20 0451 11-13-20 0744  GLUCAP 173* 164* 175*    Critical care time: 35 minutes   Noe Gens, MSN, NP-C, AGACNP-BC [facility] 13-Nov-2020, 11:31 AM   Please see [email or web address]  for pager details.   : Pulmonary critical care attending:  This is a 75 year old gentleman, transplantation, admitted for gram-negative bacteremia, septic shock, pseudomonal empyema, PEA cardiac arrest, secondary pneumothorax on mechanical vent support with multiorgan failure on CVVHD high-dose pressor requirements with climbing lactic acid.  He is in multiorgan failure and is not recovering despite maximal support and stress dose steroids.  BP (!) 102/40   Pulse 77   Temp 97.9 F (36.6 C) (Oral)   Resp (!) 29   Ht 5\' 11"  (1.803 m)   Wt 84.3 kg   SpO2 97%   BMI 25.92 kg/m   General: Elderly frail gentleman, intubated mechanical life support  HEENT: Endotracheal tube in place Heart: Tachycardic, regular S1-S2 Lungs: Bilateral mechanically ventilated breath sounds Abdomen: Distended Extremities: Cyanotic, ischemia on the distal tips of the fingers and toes  Labs: Reviewed, lactate greater than 6, following platelets  Assessment: PEA cardiac arrest, likely anoxic brain injury Multiorgan failure related to shock state, multifactorial septic shock from Pseudomonas bacteremia, profound vasoplegic shock Left empyema Secondary pneumothorax post chest compressions Bilateral strokes PAF Facial trauma status post fall prior to admission.  Plan: Continue vasopressors with maintain mean arterial pressure greater than 65 Remains on CVVHD but cannot pull volume due to hypotension Continue on vasopressors plus stress dose steroids His platelets are following which I suspect is showing bone marrow failure and development of DIC.  He has ischemic limbs which we cannot anticoagulate for due to his hematologic abnormalities I suspect he will pass during this hospitalization. There is a good chance she also has bowel ischemia from high-dose vasopressors which is not operable candidate for at this time.  Appreciate goals of care discussion which has underwent with Noe Gens, NP.  I agree that  the family should come to the hospital to see patient if possible I suspect he would likely pass despite full mechanical support.  This patient is critically ill with multiple organ system failure; which, requires frequent high complexity decision making, assessment, support, evaluation, and titration of therapies. This was completed through the application of advanced monitoring technologies and extensive interpretation of multiple databases. During this encounter critical care time was devoted to patient care services described in this note for 33 minutes.  Garner Nash, DO [facility] 10/17/20 3:44 PM

## 2020-10-21 NOTE — Progress Notes (Signed)
[facility] called. Pt suitable for tissue donation. Bedside RN notified.  Referral number [id number]

## 2020-10-21 NOTE — Progress Notes (Signed)
RN notified PCCM of absent pulses in left radial.  Assessed arterial duplex which was positive for arterial thrombus.  Given his profound thrombocytopenia, prolonged INR (which is suggestive of liver dysfunction / poor synthetic function), he is not a candidate for heparin / anticoagulation.     Additionally, RN noted during a regular turn that he had a bradycardic episode into the 30's with hypotension requiring atropine and increase in vasopressors. Family called for update on events of the day.  Discussed with them that he is likely near death and may not survive the day.  Asked that they come to bedside to be with patient.  Family pending arrival within the hour.    Noe Gens, MSN, NP-C, AGACNP-BC [facility] 04-Nov-2020, 3:30 PM   Please see [email or web address] for pager details.

## 2020-10-21 DEATH — deceased

## 2021-08-11 IMAGING — CT CT HEAD W/O CM
5 of 8 series · 17 of 47 positions shown, 18 images · non-contrast
Comparison: Head CT 08/26/2020 and earlier. Face and cervical spine
CT today reported separately.

CLINICAL DATA: 74-year-old male with chest pain beginning at 1011
hours, found unresponsive at 3233 hours. Status post CPR.

EXAM:
CT HEAD WITHOUT CONTRAST
TECHNIQUE: Contiguous axial images were obtained from the base of the skull
through the vertex without intravenous contrast.

[Series 3: head without · axial · non-contrast · 0.45mm/px · z∈[-116,+59]mm · 3 of 36 slices shown, 4 images]
[im 1/36  brain]
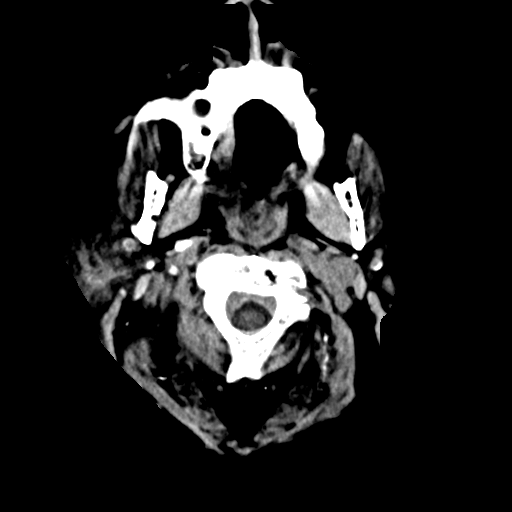
[im 1/36  bone]
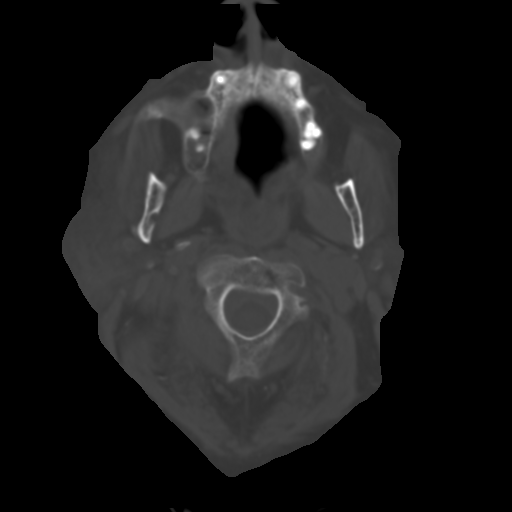
[im 18/36  brain]
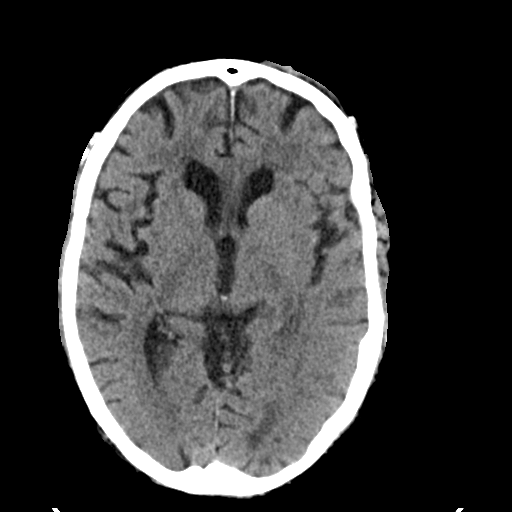
[im 36/36  brain]
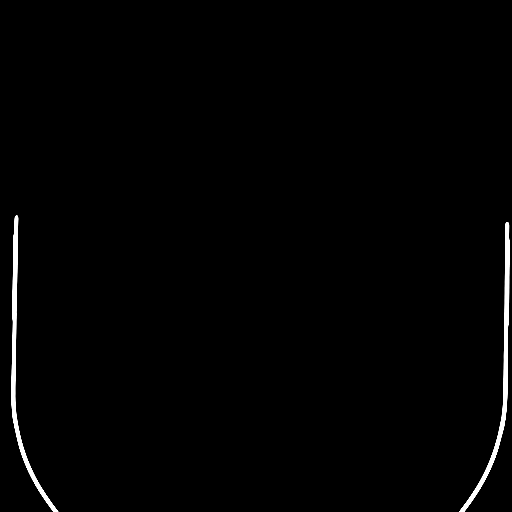

[Series 5: c_spine 2.0 st · axial · 0.30mm/px · z∈[-253,-229]mm · 2 of 94 slices shown]
[im 12/94  brain]
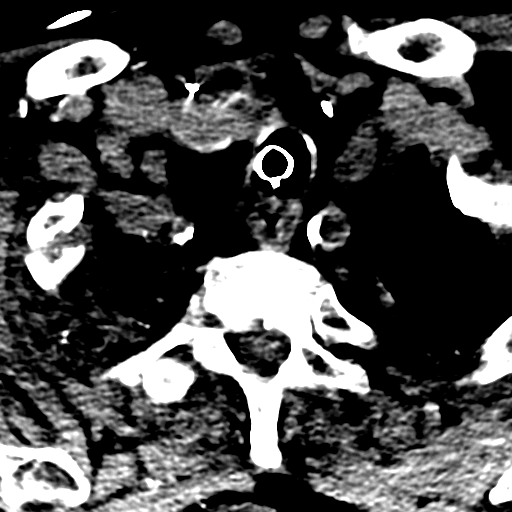
[im 24/94  brain]
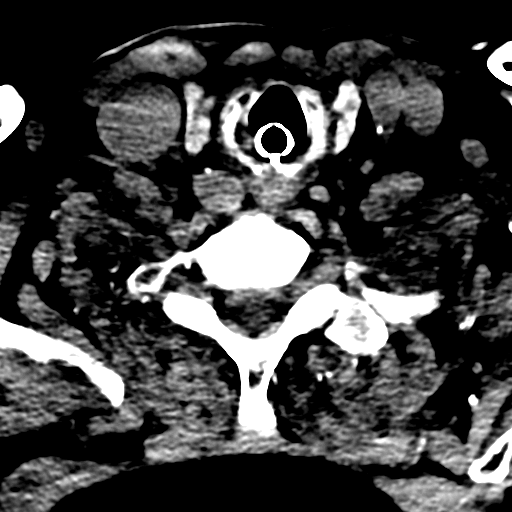

[Series 6: head bone · axial · 0.45mm/px · z∈[-96,+36]mm · 7 of 88 slices shown]
[im 11/88  bone]
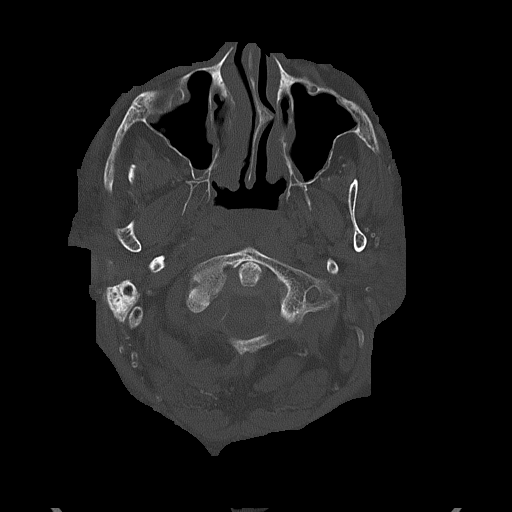
[im 22/88  bone]
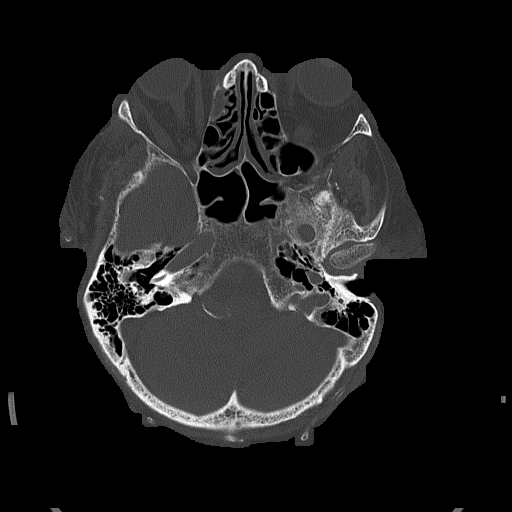
[im 33/88  bone]
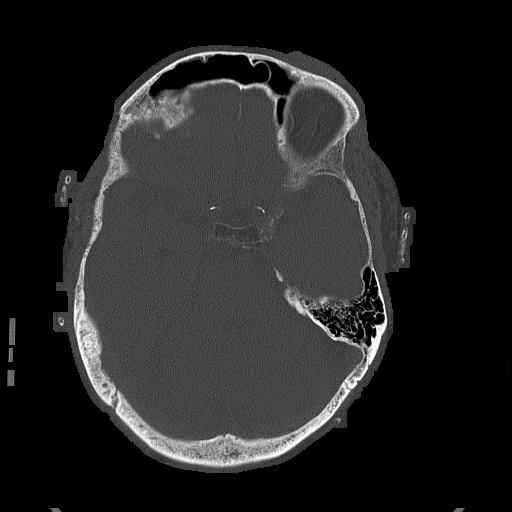
[im 44/88  bone]
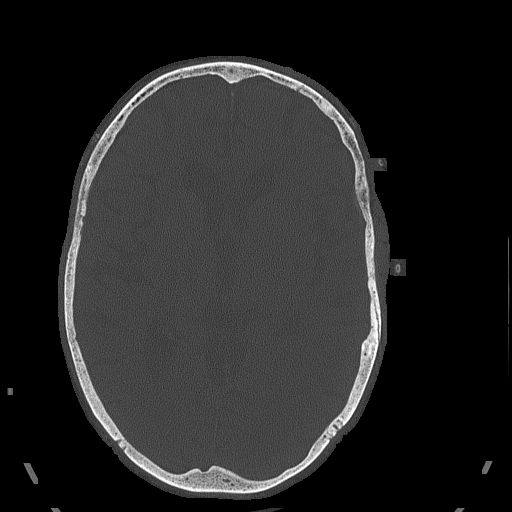
[im 55/88  bone]
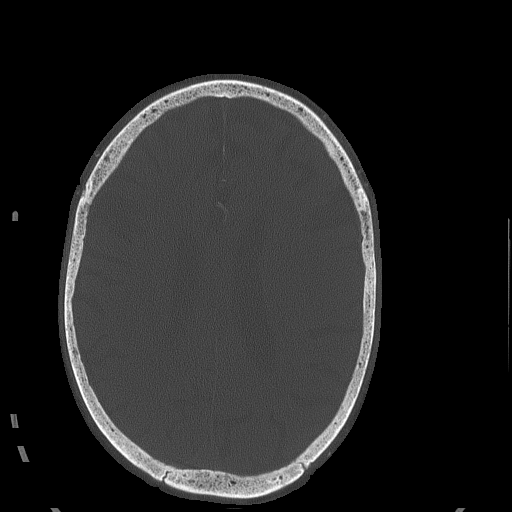
[im 66/88  bone]
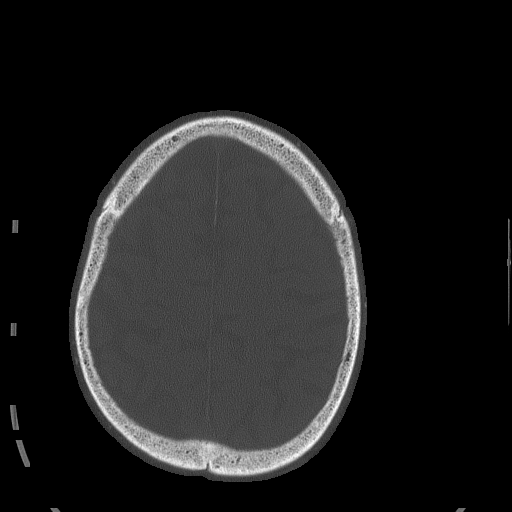
[im 77/88  bone]
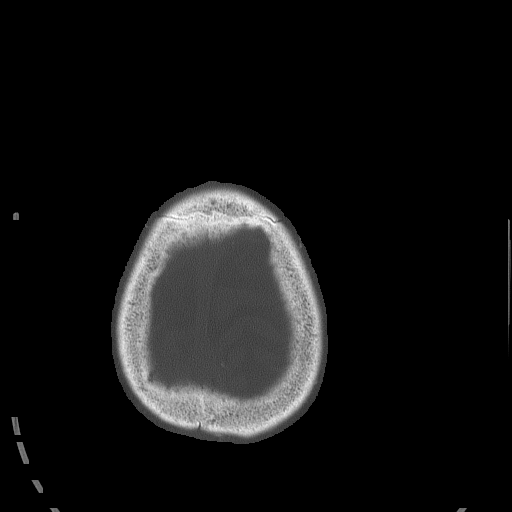

[Series 7: head without cor · coronal · non-contrast · 0.34mm/px · 3 of 73 slices shown]
[im 28/73  brain]
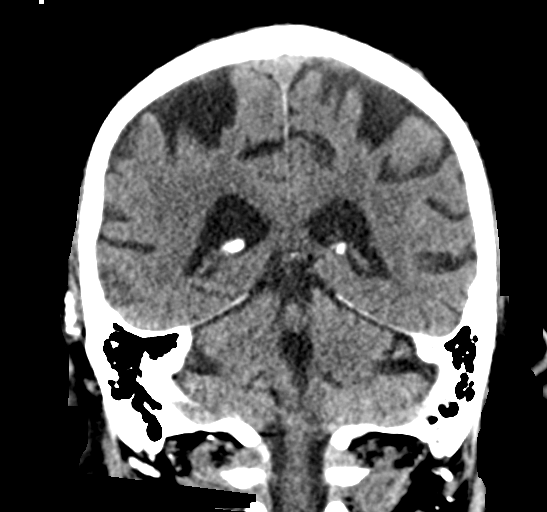
[im 37/73  brain]
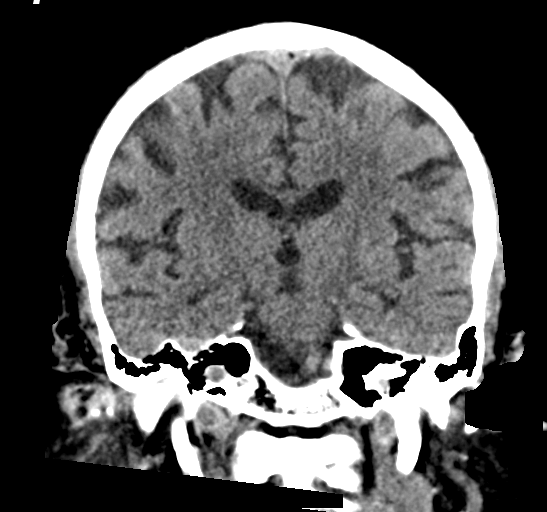
[im 46/73  brain]
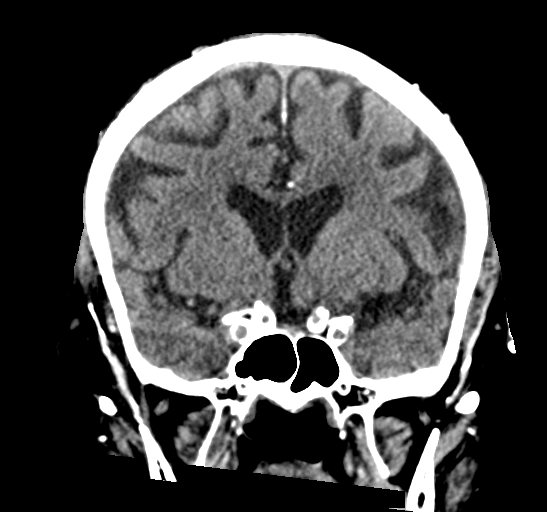

[Series 8: head without sag · sagittal · non-contrast · 0.34mm/px · 2 of 63 slices shown]
[im 21/63  brain]
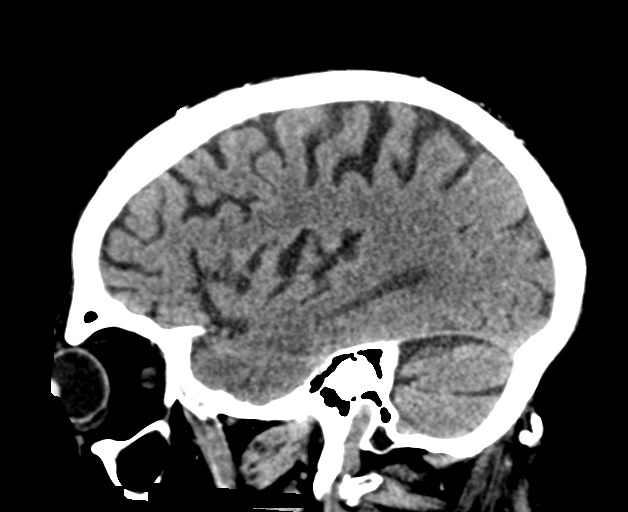
[im 42/63  brain]
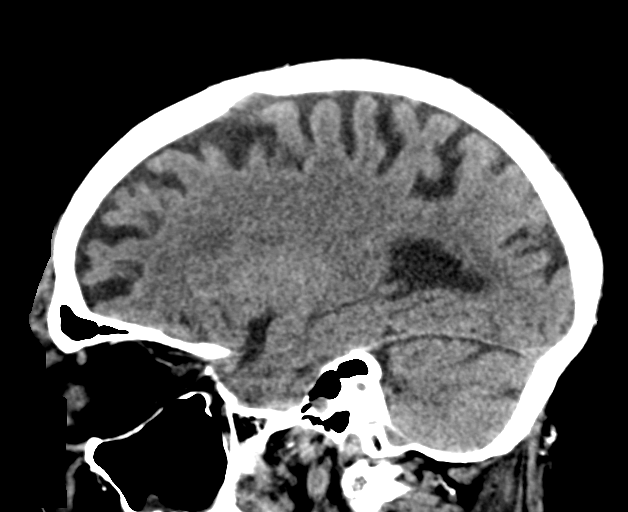

[17 of 47 positions shown; findings below may reference images not displayed]

FINDINGS: Brain: Stable cerebral volume. No midline shift, ventriculomegaly,
mass effect, evidence of mass lesion, intracranial hemorrhage or
evidence of cortically based acute infarction. Stable gray-white
matter differentiation throughout the brain. Mild for age chronic
white matter hypodensity appears stable.

Vascular: Advanced chronic calcified atherosclerosis both at the
skull base and in the scalp.

Skull: Stable.  No skull fracture identified.

Sinuses/Orbits: Visualized paranasal sinuses and mastoids are stable
and well pneumatized.

Other: Broad-based and 13 mm thick scalp hematoma at the left
forehead, which also tracks across the superficial left orbit. The
left globe and bilateral intraorbital soft tissues appears stable,
within normal limits. No underlying left frontal bone fracture
identified. See also face CT reported separately.
IMPRESSION: 1. No acute intracranial abnormality. Stable non contrast CT
appearance of the brain.
2. Left forehead and periorbital scalp hematoma with no underlying
calvarium fracture.
3. See also CT Face reported separately today.

Preliminary findings on exam were reviewed in person with Dr. REYNOSA
LABELLE SALHA on 10/01/2020 at 5542 hours.

## 2021-08-11 IMAGING — CT CT ANGIO HEAD
2 of 7 series · 8 of 33 positions shown · IV contrast (omnipaque)
Comparison: Head face and cervical spine CT reported separately
today.

CLINICAL DATA: 74-year-old male with chest pain beginning at 3233
hours, found unresponsive at 3833 hours. Status post CPR.

EXAM:
CT ANGIOGRAPHY HEAD AND NECK
TECHNIQUE: Multidetector CT imaging of the head and neck was performed using
the standard protocol during bolus administration of intravenous
contrast. Multiplanar CT image reconstructions and MIPs were
obtained to evaluate the vascular anatomy. Carotid stenosis
measurements (when applicable) are obtained utilizing NASCET
criteria, using the distal internal carotid diameter as the
denominator.
CONTRAST:  100mL OMNIPAQUE IOHEXOL 350 MG/ML SOLN

[Series 3: cta neck · axial · 0.45mm/px · z∈[-215,-93]mm · 2 of 183 slices shown]
[im 61/183  soft-tissue]
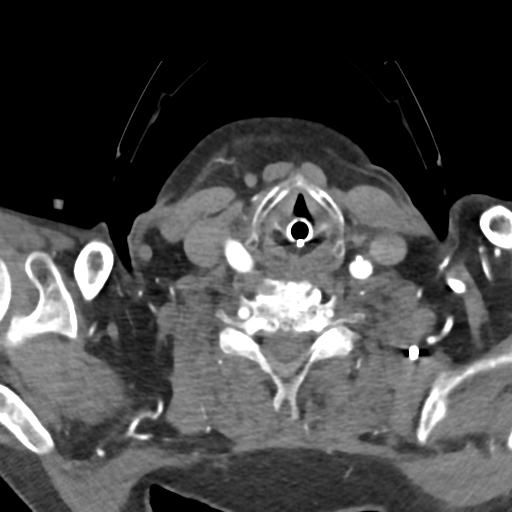
[im 122/183  soft-tissue]
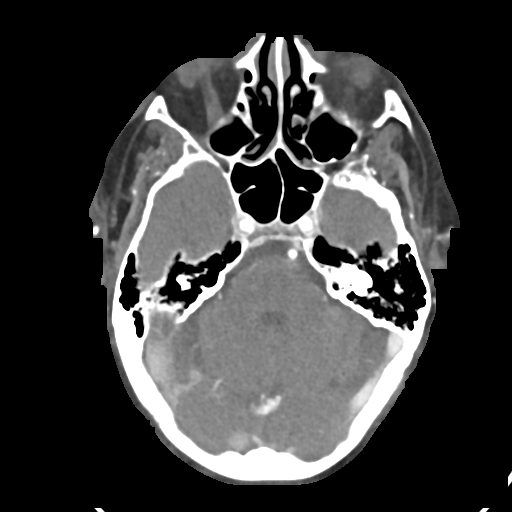

[Series 5: cta neck axial · axial · 0.39mm/px · z∈[-285,-31]mm · 6 of 359 slices shown]
[im 52/359  soft-tissue]
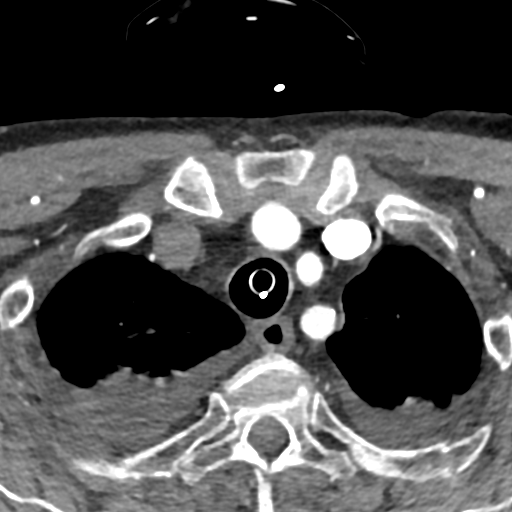
[im 103/359  bone]
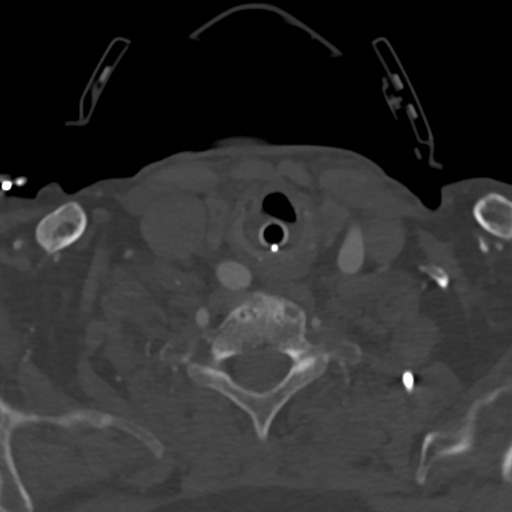
[im 154/359  soft-tissue]
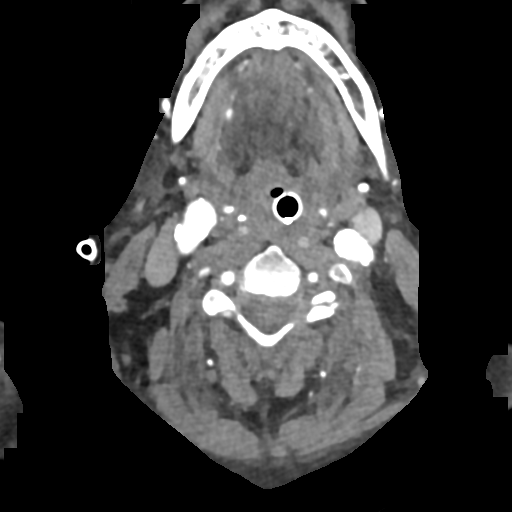
[im 205/359  bone]
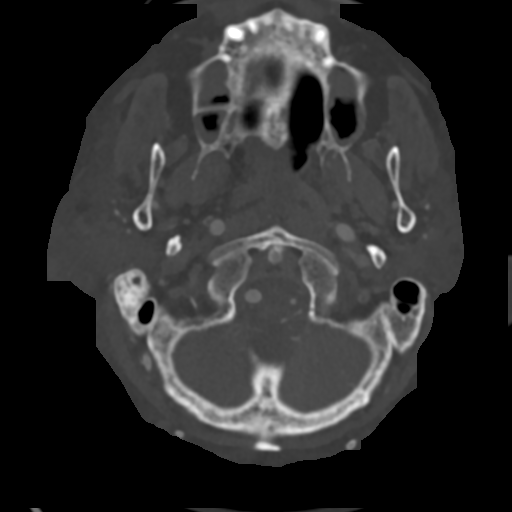
[im 256/359  soft-tissue]
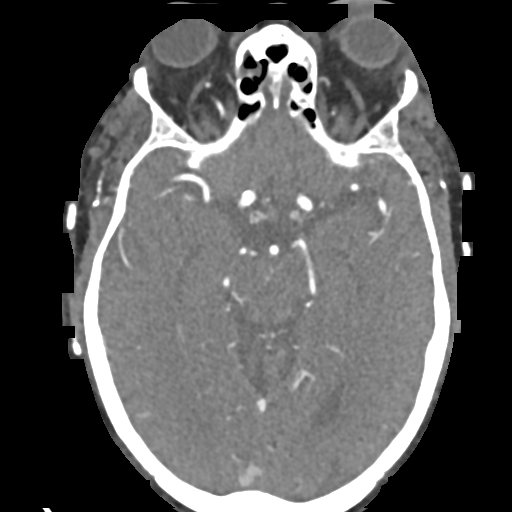
[im 307/359  bone]
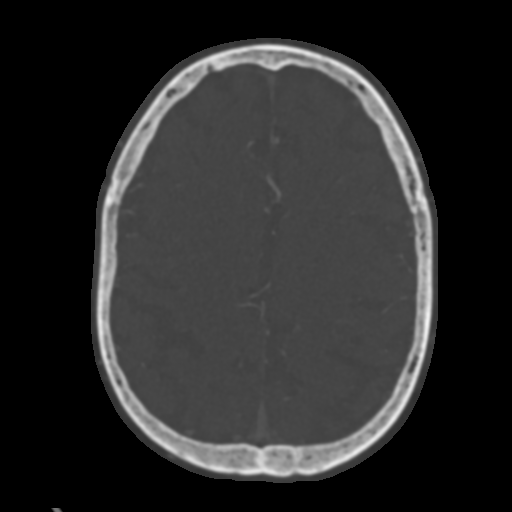

[8 of 33 positions shown; findings below may reference images not displayed]

FINDINGS: CTA NECK

Skeleton: Cervical spine detailed separately today. Rib fractures
also detailed separately today on chest CT (please see that report).

Carious dentition.

Upper chest: Detailed separately on chest CT today (please see that
report).

Other neck: Endotracheal tube in place into the chest. Small volume
fluid in the pharynx. Patchy contusion/hematoma affecting the right
parotid gland and right postauricular soft tissues, see face CT
reported separately. No other acute finding identified in the neck.

Aortic arch: 3 vessel arch configuration. Calcified aortic
atherosclerosis.

Right carotid system: Tortuous brachiocephalic artery and proximal
right CCA. Mild right CCA calcified plaque without stenosis. Mild to
moderate calcified plaque at the right carotid bifurcation without
stenosis.

Left carotid system: Similar tortuosity and mild calcified plaque
affecting the left CCA and left carotid bifurcation with no stenosis
to the skull base.

Vertebral arteries:
Mildly tortuous proximal right subclavian artery with mild plaque
and no stenosis. Heavily calcified right vertebral artery origin
which remains patent. There is moderate to severe right V1 segment
stenosis (series 6, image 120). But the right vertebral artery is
dominant and remains patent to the skull base without V2 or V3
segment stenosis.

Proximal left subclavian artery calcified plaque is mild without
stenosis. There is calcified plaque at the left vertebral artery
origin but no associated stenosis. Non dominant left vertebral with
tortuous V1 segment. Patent left vertebral to the skull base without
additional plaque or stenosis.

CTA HEAD

Posterior circulation: Chronically dolichoectatic dominant distal
right vertebral artery with circumferential calcified plaque but no
significant stenosis. Right PICA origin remains normal. Non dominant
left vertebral is diminutive beyond the patent left PICA origin but
remains patent to the vertebrobasilar junction. Patent basilar
artery with mild tortuosity, no stenosis. SCA and PCA origins are
normal. Posterior communicating arteries are diminutive or absent.
Bilateral PCA branches are patent, with mild irregularity on the
right.

Anterior circulation: Patent and mildly dolichoectatic bilateral ICA
siphons with abundant calcified plaque, but no significant siphon
stenosis. Normal ophthalmic artery origins. Patent carotid termini.
Patent MCA and ACA origins. Dominant right A1 segment. Anterior
communicating artery is normal. Bilateral ACA branches are normal
aside from mild tortuosity and irregularity. Left MCA M1 segment and
bifurcation are patent without stenosis. Right MCA M1 segment and
bifurcation are patent without stenosis. Bilateral MCA branches are
normal aside from tortuosity.

Venous sinuses: Early contrast timing, but the major dural venous
sinuses appear to be patent.

Anatomic variants: Dominant right vertebral artery. Dominant right
ACA A1.

Review of the MIP images confirms the above findings
IMPRESSION: 1. Negative for large vessel occlusion. Widespread atherosclerosis
in the head and neck.
These results were discussed with Dr. MARTARIS SULIVERAS on 10/01/2020 at
2260 hours.

2. Moderate to severe stenosis of the Dominant Right Vertebral
Artery in the V1 segment due to plaque.
But no other significant arterial stenosis in the head or neck.
Generalized arterial tortuosity.

3. Acute findings in the chest reported separately on Chest CT today
(please see that report).
